# Patient Record
Sex: Female | Born: 1940 | Race: White | Hispanic: No | Marital: Married | Smoking: Never smoker
Health system: Southern US, Community
[De-identification: ages and names within clinical notes are randomized; demographics above are authoritative.]

## PROBLEM LIST (undated history)

## (undated) DIAGNOSIS — I10 Essential (primary) hypertension: Secondary | ICD-10-CM

## (undated) DIAGNOSIS — Z923 Personal history of irradiation: Secondary | ICD-10-CM

## (undated) DIAGNOSIS — N6019 Diffuse cystic mastopathy of unspecified breast: Secondary | ICD-10-CM

## (undated) DIAGNOSIS — R112 Nausea with vomiting, unspecified: Secondary | ICD-10-CM

## (undated) DIAGNOSIS — R011 Cardiac murmur, unspecified: Secondary | ICD-10-CM

## (undated) DIAGNOSIS — R109 Unspecified abdominal pain: Secondary | ICD-10-CM

## (undated) DIAGNOSIS — G4730 Sleep apnea, unspecified: Secondary | ICD-10-CM

## (undated) DIAGNOSIS — C801 Malignant (primary) neoplasm, unspecified: Secondary | ICD-10-CM

## (undated) DIAGNOSIS — Z950 Presence of cardiac pacemaker: Secondary | ICD-10-CM

## (undated) DIAGNOSIS — K760 Fatty (change of) liver, not elsewhere classified: Secondary | ICD-10-CM

## (undated) DIAGNOSIS — E119 Type 2 diabetes mellitus without complications: Secondary | ICD-10-CM

## (undated) DIAGNOSIS — K219 Gastro-esophageal reflux disease without esophagitis: Secondary | ICD-10-CM

## (undated) DIAGNOSIS — T7840XA Allergy, unspecified, initial encounter: Secondary | ICD-10-CM

## (undated) DIAGNOSIS — K52831 Collagenous colitis: Secondary | ICD-10-CM

## (undated) DIAGNOSIS — E785 Hyperlipidemia, unspecified: Secondary | ICD-10-CM

## (undated) DIAGNOSIS — I509 Heart failure, unspecified: Secondary | ICD-10-CM

## (undated) DIAGNOSIS — I499 Cardiac arrhythmia, unspecified: Secondary | ICD-10-CM

## (undated) DIAGNOSIS — R197 Diarrhea, unspecified: Secondary | ICD-10-CM

## (undated) DIAGNOSIS — K589 Irritable bowel syndrome without diarrhea: Secondary | ICD-10-CM

## (undated) DIAGNOSIS — K7460 Unspecified cirrhosis of liver: Secondary | ICD-10-CM

## (undated) DIAGNOSIS — D509 Iron deficiency anemia, unspecified: Secondary | ICD-10-CM

## (undated) DIAGNOSIS — K5790 Diverticulosis of intestine, part unspecified, without perforation or abscess without bleeding: Secondary | ICD-10-CM

## (undated) DIAGNOSIS — Z9889 Other specified postprocedural states: Secondary | ICD-10-CM

## (undated) DIAGNOSIS — H269 Unspecified cataract: Secondary | ICD-10-CM

## (undated) DIAGNOSIS — I2510 Atherosclerotic heart disease of native coronary artery without angina pectoris: Secondary | ICD-10-CM

## (undated) DIAGNOSIS — E039 Hypothyroidism, unspecified: Secondary | ICD-10-CM

## (undated) HISTORY — PX: APPENDECTOMY: SHX54

## (undated) HISTORY — DX: Heart failure, unspecified: I50.9

## (undated) HISTORY — PX: ABDOMINAL HYSTERECTOMY: SHX81

## (undated) HISTORY — PX: CHOLECYSTECTOMY: SHX55

## (undated) HISTORY — PX: OTHER SURGICAL HISTORY: SHX169

## (undated) HISTORY — DX: Diverticulosis of intestine, part unspecified, without perforation or abscess without bleeding: K57.90

## (undated) HISTORY — DX: Collagenous colitis: K52.831

## (undated) HISTORY — DX: Unspecified cataract: H26.9

## (undated) HISTORY — PX: EYE SURGERY: SHX253

## (undated) HISTORY — DX: Malignant (primary) neoplasm, unspecified: C80.1

## (undated) HISTORY — PX: CATARACT EXTRACTION: SUR2

## (undated) HISTORY — PX: ABDOMINAL SURGERY: SHX537

## (undated) HISTORY — PX: CARDIAC SURGERY: SHX584

## (undated) HISTORY — PX: TOTAL ABDOMINAL HYSTERECTOMY W/ BILATERAL SALPINGOOPHORECTOMY: SHX83

## (undated) HISTORY — DX: Irritable bowel syndrome, unspecified: K58.9

## (undated) HISTORY — PX: BREAST EXCISIONAL BIOPSY: SUR124

## (undated) HISTORY — DX: Type 2 diabetes mellitus without complications: E11.9

## (undated) HISTORY — DX: Cardiac murmur, unspecified: R01.1

## (undated) HISTORY — DX: Allergy, unspecified, initial encounter: T78.40XA

## (undated) HISTORY — DX: Fatty (change of) liver, not elsewhere classified: K76.0

## (undated) HISTORY — DX: Sleep apnea, unspecified: G47.30

## (undated) HISTORY — PX: JOINT REPLACEMENT: SHX530

---

## 1998-12-27 ENCOUNTER — Encounter: Payer: Self-pay | Admitting: Emergency Medicine

## 1998-12-27 ENCOUNTER — Emergency Department (HOSPITAL_COMMUNITY): Admission: EM | Admit: 1998-12-27 | Discharge: 1998-12-27 | Payer: Self-pay | Admitting: Emergency Medicine

## 2014-02-11 HISTORY — PX: BREAST BIOPSY: SHX20

## 2014-06-28 ENCOUNTER — Encounter: Payer: Self-pay | Admitting: Podiatry

## 2014-06-28 ENCOUNTER — Ambulatory Visit (INDEPENDENT_AMBULATORY_CARE_PROVIDER_SITE_OTHER): Payer: Medicare Other | Admitting: Podiatry

## 2014-06-28 VITALS — Ht 63.0 in | Wt 160.0 lb

## 2014-06-28 DIAGNOSIS — L600 Ingrowing nail: Secondary | ICD-10-CM

## 2014-06-28 NOTE — Progress Notes (Signed)
   Subjective:    Patient ID: Annette Foster, female    DOB: [birth year], 74 y.o.   MRN: [id number]  HPI 74 year old female presents the office they for follow-up evaluation of bilateral big toe cellulitis from what appears to be a previous ingrown toenail. She states that she recently just new here from [place], [place] and she was treated for which she says was cellulitis to both of her big toes. She does have a follow-up appointment with them however since removed she would like to establish care here. From what she describes it sounds like she had an ingrown toenail which was removed during a slant back procedure. She states that since the area was trimmed she's had significant reduction in her symptoms and she denies any redness at this time. She denies any pain to the toenails. No other areas of tenderness to bilateral lower extremities. She does that she was using the cream to put over the area however she is unsure the name. No other complaints at this time.  Review of Systems  HENT:       Sinus problems   Cardiovascular:       Heart surgery 12/72 [facility]   Gastrointestinal:       IBS Reflex   Endocrine:       Thyroid disease Diabetes   All other systems reviewed and are negative.      Objective:   Physical Exam AAO x3, NAD DP/PT pulses palpable bilaterally, CRT less than 3 seconds Protective sensation intact with Simms Weinstein monofilament, vibratory sensation intact, Achilles tendon reflex intact There is mild evidence of incurvation of both the medial and lateral nail borders of bilateral hallux with some hyperkeratotic tissue within the nail borders. Currently there is no tenderness to palpation overlying the areasno surrounding edema, erythema, drainage or increase in warmth. There is no discoloration or evidence of cellulitis to both of the big toes at this time. No other areas of edema, erythema, increase in warmth to bilateral lower extremities. MMT 5/5, ROM WNL.  No open  lesions or pre-ulcerative lesions.   No pain with calf compression, swelling, warmth, erythema bilaterally.      Assessment & Plan:  74 year old female follow up evaluation for appearance cellulitis which appears to be resolved bilateral hallux -Treatment options were discussed include alternatives, risks, complications -I debrided bilateral hallux nail borders without complications to bleeding. -Discussed the importance of the right foot inspection. -Follow-up in 3 months or sooner if any problems are to arise. Call with questions or concerns in the meantime.

## 2014-07-26 ENCOUNTER — Other Ambulatory Visit: Payer: Self-pay | Admitting: Internal Medicine

## 2014-07-26 DIAGNOSIS — Z1231 Encounter for screening mammogram for malignant neoplasm of breast: Secondary | ICD-10-CM

## 2014-08-11 ENCOUNTER — Ambulatory Visit
Admission: RE | Admit: 2014-08-11 | Discharge: 2014-08-11 | Disposition: A | Discharge status: Home / Self Care | Payer: Medicare Other | Source: Ambulatory Visit | Attending: Internal Medicine | Admitting: Internal Medicine

## 2014-08-11 ENCOUNTER — Other Ambulatory Visit: Payer: Self-pay | Admitting: Internal Medicine

## 2014-08-11 DIAGNOSIS — Z1231 Encounter for screening mammogram for malignant neoplasm of breast: Secondary | ICD-10-CM | POA: Diagnosis not present

## 2014-08-11 HISTORY — DX: Diffuse cystic mastopathy of unspecified breast: N60.19

## 2014-10-04 ENCOUNTER — Ambulatory Visit (INDEPENDENT_AMBULATORY_CARE_PROVIDER_SITE_OTHER): Payer: Medicare Other | Admitting: Podiatry

## 2014-10-04 DIAGNOSIS — M79676 Pain in unspecified toe(s): Secondary | ICD-10-CM

## 2014-10-04 DIAGNOSIS — B351 Tinea unguium: Secondary | ICD-10-CM | POA: Diagnosis not present

## 2014-10-05 NOTE — Progress Notes (Signed)
Patient ID: Annette Foster, female   DOB: 1940/10/01, 74 y.o.   MRN: [id number]  Subjective: 74 y.o. returns the office today for painful, elongated, thickened toenails which she is unable to trim herself. Denies any redness or drainage around the nails. Denies any acute changes since last appointment and no new complaints today. Denies any systemic complaints such as fevers, chills, nausea, vomiting.   Objective: AAO 3, NAD DP/PT pulses palpable, CRT less than 3 seconds Nails hypertrophic, dystrophic, elongated, brittle, discolored 10. There is tenderness overlying the nails 1-5 bilaterally. There is no surrounding erythema or drainage along the nail sites. No open lesions or pre-ulcerative lesions are identified. No other areas of tenderness bilateral lower extremities. No overlying edema, erythema, increased warmth. No pain with calf compression, swelling, warmth, erythema.  Assessment: Patient presents with symptomatic onychomycosis  Plan: -Treatment options including alternatives, risks, complications were discussed -Nails sharply debrided 10 without complication/bleeding. -Discussed daily foot inspection. If there are any changes, to call the office immediately.  -Follow-up in 3 months or sooner if any problems are to arise. In the meantime, encouraged to call the office with any questions, concerns, changes symptoms.  Celesta Gentile, DPM

## 2015-01-10 ENCOUNTER — Ambulatory Visit (INDEPENDENT_AMBULATORY_CARE_PROVIDER_SITE_OTHER): Payer: Medicare Other | Admitting: Podiatry

## 2015-01-10 DIAGNOSIS — B351 Tinea unguium: Secondary | ICD-10-CM | POA: Diagnosis not present

## 2015-01-10 DIAGNOSIS — M79676 Pain in unspecified toe(s): Secondary | ICD-10-CM

## 2015-01-10 NOTE — Progress Notes (Signed)
Patient ID: Annette Foster, female   DOB: 01-Aug-1940, 74 y.o.   MRN: [id number]  Subjective: 74 y.o. returns the office today for painful, elongated, thickened toenails which she is unable to trim herself. Denies any redness or drainage around the nails. Denies any acute changes since last appointment and no new complaints today. Denies any systemic complaints such as fevers, chills, nausea, vomiting.   Objective: AAO 3, NAD DP/PT pulses palpable, CRT less than 3 seconds Nails hypertrophic, dystrophic, elongated, brittle, discolored 10. There is tenderness overlying the nails 1-5 bilaterally. There is no surrounding erythema or drainage along the nail sites. No open lesions or pre-ulcerative lesions are identified. No other areas of tenderness bilateral lower extremities. No overlying edema, erythema, increased warmth. No pain with calf compression, swelling, warmth, erythema.  Assessment: Patient presents with symptomatic onychomycosis  Plan: -Treatment options including alternatives, risks, complications were discussed -Nails sharply debrided 10 without complication/bleeding. -Discussed daily foot inspection. If there are any changes, to call the office immediately.  -Follow-up in 3 months or sooner if any problems are to arise. In the meantime, encouraged to call the office with any questions, concerns, changes symptoms.  Celesta Gentile, DPM

## 2015-02-12 DIAGNOSIS — C801 Malignant (primary) neoplasm, unspecified: Secondary | ICD-10-CM

## 2015-02-12 DIAGNOSIS — Z923 Personal history of irradiation: Secondary | ICD-10-CM

## 2015-02-12 HISTORY — DX: Personal history of irradiation: Z92.3

## 2015-02-12 HISTORY — DX: Malignant (primary) neoplasm, unspecified: C80.1

## 2015-04-11 ENCOUNTER — Ambulatory Visit: Admitting: Podiatry

## 2015-04-20 ENCOUNTER — Ambulatory Visit (INDEPENDENT_AMBULATORY_CARE_PROVIDER_SITE_OTHER): Payer: Medicare Other | Admitting: Podiatry

## 2015-04-20 ENCOUNTER — Encounter: Payer: Self-pay | Admitting: Podiatry

## 2015-04-20 DIAGNOSIS — M79676 Pain in unspecified toe(s): Secondary | ICD-10-CM

## 2015-04-20 DIAGNOSIS — B351 Tinea unguium: Secondary | ICD-10-CM

## 2015-04-20 NOTE — Progress Notes (Signed)
Patient ID: Annette Foster, female   DOB: [birth year], 75 y.o.   MRN: [id number]  Subjective: 75 y.o. returns the office today for painful, elongated, thickened toenails which she is unable to trim herself. Denies any redness or drainage around the nails. Denies any acute changes since last appointment and no new complaints today. Denies any systemic complaints such as fevers, chills, nausea, vomiting.   Objective: AAO 3, NAD DP/PT pulses palpable, CRT less than 3 seconds Nails hypertrophic, dystrophic, elongated, brittle, discolored 10. There is tenderness overlying the nails 1-5 bilaterally. There is no surrounding erythema or drainage along the nail sites. No open lesions or pre-ulcerative lesions are identified. No other areas of tenderness bilateral lower extremities. No overlying edema, erythema, increased warmth. No pain with calf compression, swelling, warmth, erythema.  Assessment: Patient presents with symptomatic onychomycosis  Plan: -Treatment options including alternatives, risks, complications were discussed -Nails sharply debrided 10 without complication/bleeding. -Discussed daily foot inspection. If there are any changes, to call the office immediately.  -Follow-up in 3 months or sooner if any problems are to arise. In the meantime, encouraged to call the office with any questions, concerns, changes symptoms.  Celesta Gentile, DPM

## 2015-07-04 ENCOUNTER — Other Ambulatory Visit: Payer: Self-pay | Admitting: Internal Medicine

## 2015-07-04 DIAGNOSIS — Z1231 Encounter for screening mammogram for malignant neoplasm of breast: Secondary | ICD-10-CM

## 2015-07-20 DIAGNOSIS — M79673 Pain in unspecified foot: Secondary | ICD-10-CM

## 2015-07-25 ENCOUNTER — Ambulatory Visit: Payer: Medicare Other | Admitting: Podiatry

## 2015-08-03 ENCOUNTER — Ambulatory Visit: Payer: Medicare Other | Admitting: Podiatry

## 2015-08-16 ENCOUNTER — Other Ambulatory Visit: Payer: Self-pay | Admitting: Internal Medicine

## 2015-08-16 ENCOUNTER — Ambulatory Visit
Admission: RE | Admit: 2015-08-16 | Discharge: 2015-08-16 | Disposition: A | Discharge status: Home / Self Care | Payer: Medicare Other | Source: Ambulatory Visit | Attending: Internal Medicine | Admitting: Internal Medicine

## 2015-08-16 DIAGNOSIS — Z1231 Encounter for screening mammogram for malignant neoplasm of breast: Secondary | ICD-10-CM

## 2015-08-16 DIAGNOSIS — R921 Mammographic calcification found on diagnostic imaging of breast: Secondary | ICD-10-CM | POA: Diagnosis not present

## 2015-08-21 ENCOUNTER — Other Ambulatory Visit: Payer: Self-pay | Admitting: Internal Medicine

## 2015-08-21 DIAGNOSIS — R921 Mammographic calcification found on diagnostic imaging of breast: Secondary | ICD-10-CM

## 2015-09-01 ENCOUNTER — Ambulatory Visit
Admission: RE | Admit: 2015-09-01 | Discharge: 2015-09-01 | Disposition: A | Discharge status: Home / Self Care | Payer: Medicare Other | Source: Ambulatory Visit | Attending: Internal Medicine | Admitting: Internal Medicine

## 2015-09-01 ENCOUNTER — Other Ambulatory Visit: Payer: Self-pay | Admitting: Internal Medicine

## 2015-09-01 DIAGNOSIS — R921 Mammographic calcification found on diagnostic imaging of breast: Secondary | ICD-10-CM

## 2015-09-04 ENCOUNTER — Other Ambulatory Visit: Payer: Self-pay | Admitting: Internal Medicine

## 2015-09-04 DIAGNOSIS — R921 Mammographic calcification found on diagnostic imaging of breast: Secondary | ICD-10-CM

## 2015-09-11 ENCOUNTER — Ambulatory Visit
Admission: RE | Admit: 2015-09-11 | Discharge: 2015-09-11 | Disposition: A | Discharge status: Home / Self Care | Payer: Medicare Other | Source: Ambulatory Visit | Attending: Internal Medicine | Admitting: Internal Medicine

## 2015-09-11 DIAGNOSIS — R921 Mammographic calcification found on diagnostic imaging of breast: Secondary | ICD-10-CM

## 2015-09-11 DIAGNOSIS — D0591 Unspecified type of carcinoma in situ of right breast: Secondary | ICD-10-CM | POA: Insufficient documentation

## 2015-09-11 DIAGNOSIS — R920 Mammographic microcalcification found on diagnostic imaging of breast: Secondary | ICD-10-CM | POA: Insufficient documentation

## 2015-09-11 HISTORY — PX: BREAST BIOPSY: SHX20

## 2015-09-12 ENCOUNTER — Encounter: Payer: Self-pay | Admitting: Podiatry

## 2015-09-12 ENCOUNTER — Ambulatory Visit (INDEPENDENT_AMBULATORY_CARE_PROVIDER_SITE_OTHER): Payer: Medicare Other | Admitting: Podiatry

## 2015-09-12 DIAGNOSIS — B351 Tinea unguium: Secondary | ICD-10-CM | POA: Diagnosis not present

## 2015-09-12 DIAGNOSIS — M79676 Pain in unspecified toe(s): Secondary | ICD-10-CM | POA: Diagnosis not present

## 2015-09-13 ENCOUNTER — Encounter: Payer: Self-pay | Admitting: *Deleted

## 2015-09-17 NOTE — Progress Notes (Signed)
Patient ID: Annette Foster, female   DOB: 10/18/1940, 74 y.o.   MRN: [id number]  Subjective: 74 y.o. returns the office today for painful, elongated, thickened toenails which she is unable to trim herself. Denies any redness or drainage around the nails. Denies any acute changes since last appointment and no new complaints today. Denies any systemic complaints such as fevers, chills, nausea, vomiting.   Objective: AAO 3, NAD DP/PT pulses palpable, CRT less than 3 seconds Nails hypertrophic, dystrophic, elongated, brittle, discolored 10. There is tenderness overlying the nails 1-5 bilaterally. There is no surrounding erythema or drainage along the nail sites. No open lesions or pre-ulcerative lesions are identified. No other areas of tenderness bilateral lower extremities. No overlying edema, erythema, increased warmth. No pain with calf compression, swelling, warmth, erythema.  Assessment: Patient presents with symptomatic onychomycosis  Plan: -Treatment options including alternatives, risks, complications were discussed -Nails sharply debrided 10 without complication/bleeding. -Discussed daily foot inspection. If there are any changes, to call the office immediately.  -Follow-up in 3 months or sooner if any problems are to arise. In the meantime, encouraged to call the office with any questions, concerns, changes symptoms.  Jamall Strohmeier, DPM  

## 2015-09-18 ENCOUNTER — Ambulatory Visit: Admitting: General Surgery

## 2015-09-18 ENCOUNTER — Encounter: Payer: Self-pay | Admitting: *Deleted

## 2015-09-21 NOTE — Progress Notes (Signed)
  Oncology Nurse Navigator Documentation  Navigator Location: [facility] (09/21/15 1400) Navigator Encounter Type: Clinic/MDC (09/21/15 1400)   Abnormal Finding Date: 09/01/15 (09/21/15 1400) Confirmed Diagnosis Date: 09/12/15 (09/21/15 1400)     Patient Visit Type: Walk-in (09/21/15 1400) Treatment Phase: Pre-Tx/Tx Discussion (09/21/15 1400) Barriers/Navigation Needs: Coordination of Care;Education (09/21/15 1400) Education: Coping with Diagnosis/ Prognosis;Newly Diagnosed Cancer Education;Other (09/21/15 1400) Interventions: Coordination of Care;Education Method (09/21/15 1400)   Coordination of Care: Appts (09/21/15 1400) Education Method: Written (09/21/15 1400)  Support Groups/Services: Breast Support Group;After Breast Cancer Class (09/21/15 1400)   Acuity: Level 2 (09/21/15 1400)         Time Spent with Patient: 60 (09/21/15 1400)   Patient came to the cancer center after being informed of her diagnosis to see one of the navigators.  Met with patient and reviewed diagnosis and scheduled patient appointments with Dr. Jamal Collin and Dr. Rogue Bussing per her request.  States she is currently taking estrogen on an [employer] study.  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  She is agreeable.  She is to call if she has any questions or needs.

## 2015-09-21 NOTE — Progress Notes (Signed)
Patient called and wanted to change her surgical consult to Dr. Tamala Julian.  Appointment scheduled to see Dr. Tamala Julian on Thursday 09/21/15.  She is to cancel the appointment with Dr. Jamal Collin.

## 2015-09-22 ENCOUNTER — Encounter: Payer: Self-pay | Admitting: *Deleted

## 2015-09-22 NOTE — Progress Notes (Signed)
  Oncology Nurse Navigator Documentation  Navigator Location: [facility] (09/22/15 1300) Navigator Encounter Type: Telephone (09/22/15 1300) Telephone: Incoming Call (09/22/15 1300)     Surgery Date: 09/29/15 (09/22/15 1300) Treatment Initiated Date: 09/29/15 (09/22/15 1300)     Barriers/Navigation Needs: Coordination of Care (09/22/15 1300)       Coordination of Care: Appts (09/22/15 1300)        Acuity: Level 2 (09/22/15 1300)         Time Spent with Patient: 30 (09/22/15 1300)   Patient called with her surgery date of 09/29/15.  States Dr. Tamala Julian recommended she change her medical oncology visit to a later date to ensure path availability.  Appointment changed to 10/12/15 @ 8:30.

## 2015-09-25 ENCOUNTER — Other Ambulatory Visit: Payer: Self-pay | Admitting: Surgery

## 2015-09-25 DIAGNOSIS — D0512 Intraductal carcinoma in situ of left breast: Secondary | ICD-10-CM

## 2015-09-25 LAB — SURGICAL PATHOLOGY

## 2015-09-26 ENCOUNTER — Other Ambulatory Visit: Payer: Self-pay | Admitting: Surgery

## 2015-09-26 ENCOUNTER — Encounter
Admission: RE | Admit: 2015-09-26 | Discharge: 2015-09-26 | Disposition: A | Discharge status: Home / Self Care | Payer: Medicare Other | Source: Ambulatory Visit | Attending: Surgery | Admitting: Surgery

## 2015-09-26 DIAGNOSIS — E119 Type 2 diabetes mellitus without complications: Secondary | ICD-10-CM | POA: Diagnosis not present

## 2015-09-26 DIAGNOSIS — Z885 Allergy status to narcotic agent status: Secondary | ICD-10-CM | POA: Diagnosis not present

## 2015-09-26 DIAGNOSIS — G4730 Sleep apnea, unspecified: Secondary | ICD-10-CM | POA: Diagnosis not present

## 2015-09-26 DIAGNOSIS — Z9049 Acquired absence of other specified parts of digestive tract: Secondary | ICD-10-CM | POA: Diagnosis not present

## 2015-09-26 DIAGNOSIS — Z90722 Acquired absence of ovaries, bilateral: Secondary | ICD-10-CM | POA: Diagnosis not present

## 2015-09-26 DIAGNOSIS — K589 Irritable bowel syndrome without diarrhea: Secondary | ICD-10-CM | POA: Diagnosis not present

## 2015-09-26 DIAGNOSIS — Z881 Allergy status to other antibiotic agents status: Secondary | ICD-10-CM | POA: Diagnosis not present

## 2015-09-26 DIAGNOSIS — Z9889 Other specified postprocedural states: Secondary | ICD-10-CM | POA: Diagnosis not present

## 2015-09-26 DIAGNOSIS — D0512 Intraductal carcinoma in situ of left breast: Secondary | ICD-10-CM | POA: Diagnosis present

## 2015-09-26 DIAGNOSIS — Z96652 Presence of left artificial knee joint: Secondary | ICD-10-CM | POA: Diagnosis not present

## 2015-09-26 DIAGNOSIS — Z90710 Acquired absence of both cervix and uterus: Secondary | ICD-10-CM | POA: Diagnosis not present

## 2015-09-26 DIAGNOSIS — Z7982 Long term (current) use of aspirin: Secondary | ICD-10-CM | POA: Diagnosis not present

## 2015-09-26 DIAGNOSIS — K219 Gastro-esophageal reflux disease without esophagitis: Secondary | ICD-10-CM | POA: Diagnosis not present

## 2015-09-26 DIAGNOSIS — Z79899 Other long term (current) drug therapy: Secondary | ICD-10-CM | POA: Diagnosis not present

## 2015-09-26 DIAGNOSIS — E7800 Pure hypercholesterolemia, unspecified: Secondary | ICD-10-CM | POA: Diagnosis not present

## 2015-09-26 DIAGNOSIS — E039 Hypothyroidism, unspecified: Secondary | ICD-10-CM | POA: Diagnosis not present

## 2015-09-26 DIAGNOSIS — I509 Heart failure, unspecified: Secondary | ICD-10-CM | POA: Diagnosis not present

## 2015-09-26 DIAGNOSIS — Z803 Family history of malignant neoplasm of breast: Secondary | ICD-10-CM | POA: Diagnosis not present

## 2015-09-26 DIAGNOSIS — Z888 Allergy status to other drugs, medicaments and biological substances status: Secondary | ICD-10-CM | POA: Diagnosis not present

## 2015-09-26 HISTORY — DX: Nausea with vomiting, unspecified: R11.2

## 2015-09-26 HISTORY — DX: Other specified postprocedural states: Z98.890

## 2015-09-26 HISTORY — DX: Hypothyroidism, unspecified: E03.9

## 2015-09-26 HISTORY — DX: Gastro-esophageal reflux disease without esophagitis: K21.9

## 2015-09-26 NOTE — Patient Instructions (Signed)
Your procedure is scheduled on: Friday 09/29/15 Report to [facility]Mar[facility] Kitchen  Remember: Instructions that are not followed completely may result in serious medical risk, up to and including death, or upon the discretion of your surgeon and anesthesiologist your surgery may need to be rescheduled.    __X__ 1. Do not eat food or drink liquids after midnight. No gum chewing or hard candies.     __X__ 2. No Alcohol for 24 hours before or after surgery.   ____ 3. Bring all medications with you on the day of surgery if instructed.    __X__ 4. Notify your doctor if there is any change in your medical condition     (cold, fever, infections).     Do not wear jewelry, make-up, hairpins, clips or nail polish.  Do not wear lotions, powders, or perfumes.   Do not shave 48 hours prior to surgery. Men may shave face and neck.  Do not bring valuables to the hospital.    [facility] is not responsible for any belongings or valuables.               Contacts, dentures or bridgework may not be worn into surgery.  Leave your suitcase in the car. After surgery it may be brought to your room.  For patients admitted to the hospital, discharge time is determined by your                treatment team.   Patients discharged the day of surgery will not be allowed to drive home.   Please read over the following fact sheets that you were given:   Surgical Site Infection Prevention   __X__ Take these medicines the morning of surgery with A SIP OF WATER:    1. FEXOFENADINE  2. LANSOPRAZOLE  3. HYOSCYAMINE  4. LEVOTHYROXINE  5. VALSARTAN  6.  ____ Fleet Enema (as directed)   __X__ Use CHG Soap as directed  ____ Use inhalers on the day of surgery  __X__ Stop metformin 2 days prior to surgery    ____ Take 1/2 of usual insulin dose the night before surgery and none on the morning of surgery.   __X__ Stop Coumadin/Plavix/aspirin on ALREADY STOPPED  ____ Stop  Anti-inflammatories on    ____ Stop supplements until after surgery.    __X__ Bring C-Pap to the hospital.

## 2015-09-29 ENCOUNTER — Ambulatory Visit
Admission: RE | Admit: 2015-09-29 | Discharge: 2015-09-29 | Disposition: A | Discharge status: Home / Self Care | Payer: Medicare Other | Source: Ambulatory Visit | Attending: Surgery | Admitting: Surgery

## 2015-09-29 ENCOUNTER — Ambulatory Visit: Payer: Medicare Other | Admitting: Certified Registered Nurse Anesthetist

## 2015-09-29 ENCOUNTER — Encounter
Admission: RE | Disposition: A | Discharge status: Home / Self Care | Payer: Self-pay | Source: Ambulatory Visit | Attending: Surgery

## 2015-09-29 DIAGNOSIS — E7800 Pure hypercholesterolemia, unspecified: Secondary | ICD-10-CM | POA: Insufficient documentation

## 2015-09-29 DIAGNOSIS — Z96652 Presence of left artificial knee joint: Secondary | ICD-10-CM | POA: Insufficient documentation

## 2015-09-29 DIAGNOSIS — E119 Type 2 diabetes mellitus without complications: Secondary | ICD-10-CM | POA: Insufficient documentation

## 2015-09-29 DIAGNOSIS — G4730 Sleep apnea, unspecified: Secondary | ICD-10-CM | POA: Insufficient documentation

## 2015-09-29 DIAGNOSIS — I509 Heart failure, unspecified: Secondary | ICD-10-CM | POA: Insufficient documentation

## 2015-09-29 DIAGNOSIS — Z888 Allergy status to other drugs, medicaments and biological substances status: Secondary | ICD-10-CM | POA: Insufficient documentation

## 2015-09-29 DIAGNOSIS — Z7982 Long term (current) use of aspirin: Secondary | ICD-10-CM | POA: Insufficient documentation

## 2015-09-29 DIAGNOSIS — K219 Gastro-esophageal reflux disease without esophagitis: Secondary | ICD-10-CM | POA: Insufficient documentation

## 2015-09-29 DIAGNOSIS — Z885 Allergy status to narcotic agent status: Secondary | ICD-10-CM | POA: Insufficient documentation

## 2015-09-29 DIAGNOSIS — D0512 Intraductal carcinoma in situ of left breast: Secondary | ICD-10-CM | POA: Insufficient documentation

## 2015-09-29 DIAGNOSIS — Z881 Allergy status to other antibiotic agents status: Secondary | ICD-10-CM | POA: Insufficient documentation

## 2015-09-29 DIAGNOSIS — Z79899 Other long term (current) drug therapy: Secondary | ICD-10-CM | POA: Insufficient documentation

## 2015-09-29 DIAGNOSIS — Z90722 Acquired absence of ovaries, bilateral: Secondary | ICD-10-CM | POA: Insufficient documentation

## 2015-09-29 DIAGNOSIS — Z9889 Other specified postprocedural states: Secondary | ICD-10-CM | POA: Insufficient documentation

## 2015-09-29 DIAGNOSIS — E039 Hypothyroidism, unspecified: Secondary | ICD-10-CM | POA: Insufficient documentation

## 2015-09-29 DIAGNOSIS — Z90710 Acquired absence of both cervix and uterus: Secondary | ICD-10-CM | POA: Insufficient documentation

## 2015-09-29 DIAGNOSIS — Z803 Family history of malignant neoplasm of breast: Secondary | ICD-10-CM | POA: Insufficient documentation

## 2015-09-29 DIAGNOSIS — Z9049 Acquired absence of other specified parts of digestive tract: Secondary | ICD-10-CM | POA: Insufficient documentation

## 2015-09-29 DIAGNOSIS — K589 Irritable bowel syndrome without diarrhea: Secondary | ICD-10-CM | POA: Insufficient documentation

## 2015-09-29 HISTORY — PX: PARTIAL MASTECTOMY WITH NEEDLE LOCALIZATION: SHX6008

## 2015-09-29 LAB — GLUCOSE, CAPILLARY: Glucose-Capillary: 124 mg/dL — ABNORMAL HIGH (ref 65–99)

## 2015-09-29 SURGERY — PARTIAL MASTECTOMY WITH NEEDLE LOCALIZATION
Anesthesia: General | Laterality: Left

## 2015-09-29 MED ORDER — HYDROMORPHONE HCL 1 MG/ML IJ SOLN: 0.2500 mg | INTRAMUSCULAR | Status: DC | PRN

## 2015-09-29 MED ORDER — BUPIVACAINE-EPINEPHRINE 0.5% -1:200000 IJ SOLN
INTRAMUSCULAR | Status: DC | PRN
  Administered 2015-09-29: 10 mL

## 2015-09-29 MED ORDER — LIDOCAINE HCL (CARDIAC) 20 MG/ML IV SOLN
INTRAVENOUS | Status: DC | PRN
  Administered 2015-09-29: 100 mg via INTRAVENOUS

## 2015-09-29 MED ORDER — DEXAMETHASONE SODIUM PHOSPHATE 4 MG/ML IJ SOLN
INTRAMUSCULAR | Status: DC | PRN
  Administered 2015-09-29: 5 mg via INTRAVENOUS

## 2015-09-29 MED ORDER — BUPIVACAINE-EPINEPHRINE (PF) 0.5% -1:200000 IJ SOLN
INTRAMUSCULAR | Status: AC
  Filled 2015-09-29: qty 30

## 2015-09-29 MED ORDER — PROPOFOL 10 MG/ML IV BOLUS
INTRAVENOUS | Status: DC | PRN
  Administered 2015-09-29: 130 mg via INTRAVENOUS

## 2015-09-29 MED ORDER — ONDANSETRON HCL 4 MG/2ML IJ SOLN: 4.0000 mg | Freq: Once | INTRAMUSCULAR | Status: DC | PRN

## 2015-09-29 MED ORDER — ONDANSETRON HCL 4 MG/2ML IJ SOLN
INTRAMUSCULAR | Status: DC | PRN
  Administered 2015-09-29: 4 mg via INTRAVENOUS

## 2015-09-29 MED ORDER — EPHEDRINE SULFATE 50 MG/ML IJ SOLN
INTRAMUSCULAR | Status: DC | PRN
  Administered 2015-09-29: 15 mg via INTRAVENOUS
  Administered 2015-09-29: 5 mg via INTRAVENOUS
  Administered 2015-09-29: 10 mg via INTRAVENOUS

## 2015-09-29 MED ORDER — SODIUM CHLORIDE 0.9 % IV SOLN
INTRAVENOUS | Status: DC
  Administered 2015-09-29: 13:00:00 via INTRAVENOUS

## 2015-09-29 MED ORDER — FENTANYL CITRATE (PF) 100 MCG/2ML IJ SOLN
INTRAMUSCULAR | Status: DC | PRN
  Administered 2015-09-29 (×2): 50 ug via INTRAVENOUS

## 2015-09-29 MED ORDER — SCOPOLAMINE 1 MG/3DAYS TD PT72: 1.0000 | MEDICATED_PATCH | TRANSDERMAL | Status: DC

## 2015-09-29 MED ORDER — KETAMINE HCL 50 MG/ML IJ SOLN
INTRAMUSCULAR | Status: DC | PRN
  Administered 2015-09-29: 40 mg via INTRAVENOUS

## 2015-09-29 MED ORDER — GLYCOPYRROLATE 0.2 MG/ML IJ SOLN
INTRAMUSCULAR | Status: DC | PRN
  Administered 2015-09-29: 0.2 mg via INTRAVENOUS

## 2015-09-29 MED ORDER — MIDAZOLAM HCL 2 MG/2ML IJ SOLN
INTRAMUSCULAR | Status: DC | PRN
Start: 2015-09-29 — End: 2015-09-29
  Administered 2015-09-29: 1 mg via INTRAVENOUS

## 2015-09-29 SURGICAL SUPPLY — 27 items
BLADE SURG 15 STRL LF DISP TIS (BLADE) ×1 IMPLANT
BLADE SURG 15 STRL SS (BLADE) ×2
CANISTER SUCT 1200ML W/VALVE (MISCELLANEOUS) ×2 IMPLANT
CHLORAPREP W/TINT 26ML (MISCELLANEOUS) ×2 IMPLANT
DEVICE DUBIN SPECIMEN MAMMOGRA (MISCELLANEOUS) ×2 IMPLANT
DRAPE LAPAROTOMY 77X122 PED (DRAPES) ×2 IMPLANT
ELECT REM PT RETURN 9FT ADLT (ELECTROSURGICAL) ×2
ELECTRODE REM PT RTRN 9FT ADLT (ELECTROSURGICAL) ×1 IMPLANT
GLOVE BIO SURGEON STRL SZ7.5 (GLOVE) ×3 IMPLANT
GLOVE INDICATOR 7.5 STRL GRN (GLOVE) ×1 IMPLANT
GOWN STRL REUS W/ TWL LRG LVL3 (GOWN DISPOSABLE) ×2 IMPLANT
GOWN STRL REUS W/TWL LRG LVL3 (GOWN DISPOSABLE) ×4
KIT RM TURNOVER STRD PROC AR (KITS) ×2 IMPLANT
LABEL OR SOLS (LABEL) ×1 IMPLANT
LIQUID BAND (GAUZE/BANDAGES/DRESSINGS) ×2 IMPLANT
MARGIN MAP 10MM (MISCELLANEOUS) ×2 IMPLANT
NDL HYPO 25X1 1.5 SAFETY (NEEDLE) ×1 IMPLANT
NEEDLE HYPO 25X1 1.5 SAFETY (NEEDLE) ×2 IMPLANT
PACK BASIN MINOR ARMC (MISCELLANEOUS) ×2 IMPLANT
SUT CHROMIC 4 0 RB 1X27 (SUTURE) ×2 IMPLANT
SUT ETHILON 3-0 FS-10 30 BLK (SUTURE) ×2
SUT MNCRL 4-0 (SUTURE) ×2
SUT MNCRL 4-0 27XMFL (SUTURE) ×1
SUTURE EHLN 3-0 FS-10 30 BLK (SUTURE) ×1 IMPLANT
SUTURE MNCRL 4-0 27XMF (SUTURE) ×1 IMPLANT
SYRINGE 10CC LL (SYRINGE) ×2 IMPLANT
WATER STERILE IRR 1000ML POUR (IV SOLUTION) ×2 IMPLANT

## 2015-09-29 NOTE — Anesthesia Procedure Notes (Signed)
Procedure Name: LMA Insertion Date/Time: 09/29/2015 2:00 PM Performed by: Rosaria Ferries, Hannia Matchett Pre-anesthesia Checklist: Patient identified, Emergency Drugs available, Suction available and Patient being monitored Patient Re-evaluated:Patient Re-evaluated prior to inductionOxygen Delivery Method: Circle system utilized Preoxygenation: Pre-oxygenation with 100% oxygen Intubation Type: IV induction LMA: LMA inserted LMA Size: 4.0 Number of attempts: 1 Placement Confirmation: breath sounds checked- equal and bilateral Tube secured with: Tape Dental Injury: Teeth and Oropharynx as per pre-operative assessment

## 2015-09-29 NOTE — Discharge Instructions (Signed)
AMBULATORY SURGERY  DISCHARGE INSTRUCTIONS   1) The drugs that you were given will stay in your system until tomorrow so for the next 24 hours you should not:  A) Drive an automobile B) Make any legal decisions C) Drink any alcoholic beverage   2) You may resume regular meals tomorrow.  Today it is better to start with liquids and gradually work up to solid foods.  You may eat anything you prefer, but it is better to start with liquids, then soup and crackers, and gradually work up to solid foods.   3) Please notify your doctor immediately if you have any unusual bleeding, trouble breathing, redness and pain at the surgery site, drainage, fever, or pain not relieved by medication.    4) Additional Instructions:        Please contact your physician with any problems or Same Day Surgery at [phone], Monday through Friday 6 am to 4 pm, or [facility] at [facility] number at [phone].Take Tylenol and/or tramadol as needed for pain.  Wear bra as desired for comfort and support.  May shower and blot dry.  Gradually increase activities as tolerated.

## 2015-09-29 NOTE — Op Note (Signed)
OPERATIVE REPORT  PREOPERATIVE  DIAGNOSIS: . Ductal carcinoma in situ left breast  POSTOPERATIVE DIAGNOSIS: . Ductal carcinoma in situ left breast  PROCEDURE: . Left partial mastectomy  ANESTHESIA:  General  SURGEON: Rochel Brome  MD   INDICATIONS: . She had recent mammogram findings of an area of distortion and microcalcifications of the upper slightly inner aspect of the left breast. Stereotactic needle biopsy demonstrated ductal carcinoma in situ. Surgery was recommended for definitive treatment. The patient did have insertion of 2 Kopan's wires to bracket the site of microcalcifications. Mammogram images were reviewed prior to surgery.  With the patient on the operating table in the supine position she was placed under general anesthesia. The dressing was removed from the upper aspect of the left breast. The Kopan's wires were cut 2 cm from the skin. The breast was prepared with ChloraPrep and draped in a sterile manner.  A transversely oriented curvilinear incision was made from 10:00 to 1:00 position 4.5 centimeters from the nipple. A narrow ellipse of skin 8 mm in width was excised with the underlying specimen. The dissection was carried down through subcutaneous tissues. Electrocautery was used for hemostasis. Dissection was carried out to encounter the anterior wire and continued to encounter the posterior wire. A mass of tissue surrounding the wires was removed. Markers were attached to the specimen to mark the medial lateral cranial caudal and deep margins. The specimen was submitted with Kopan's wires for specimen mammogram and pathology. The specimen mammogram did demonstrate the biopsy marker. The pathologist reported margins appeared to be satisfactory. The wound was inspected. Several small bleeding points are cauterized and hemostasis was subsequently intact. The wound was infiltrated with half percent Sensorcaine with epinephrine. The subcutaneous tissues were approximated with  interrupted 4-0 chromic. The skin was closed with running 4-0 Monocryl subcuticular suture and LiquiBand.  The patient tolerated the procedure satisfactorily and was prepared for transfer to the recovery room  [facility]D.

## 2015-09-29 NOTE — Transfer of Care (Signed)
Immediate Anesthesia Transfer of Care Note  Patient: Annette Foster  Procedure(s) Performed: Procedure(s): PARTIAL MASTECTOMY WITH NEEDLE LOCALIZATION (Left)  Patient Location: PACU  Anesthesia Type:General  Level of Consciousness: sedated  Airway & Oxygen Therapy: Patient Spontanous Breathing and Patient connected to face mask oxygen  Post-op Assessment: Report given to RN and Post -op Vital signs reviewed and stable  Post vital signs: Reviewed and stable  Last Vitals:  Vitals:   09/29/15 1228  BP: (!) 131/59  Pulse: (!) 49  Resp: 16  Temp: 36.5 C    Last Pain:  Vitals:   09/29/15 1228  TempSrc: Tympanic         Complications: No apparent anesthesia complications

## 2015-09-29 NOTE — Anesthesia Preprocedure Evaluation (Signed)
Anesthesia Evaluation  Patient identified by MRN, date of birth, ID band Patient awake    Reviewed: Allergy & Precautions, H&P , NPO status , Patient's Chart, lab work & pertinent test results, reviewed documented beta blocker date and time   History of Anesthesia Complications (+) PONV and history of anesthetic complications  Airway Mallampati: III  TM Distance: >3 FB Neck ROM: full    Dental  (+) Upper Dentures, Lower Dentures   Pulmonary neg pulmonary ROS, sleep apnea ,    Pulmonary exam normal        Cardiovascular Exercise Tolerance: Poor +CHF  negative cardio ROS Normal cardiovascular exam Rhythm:regular Rate:Normal     Neuro/Psych negative neurological ROS  negative psych ROS   GI/Hepatic negative GI ROS, Neg liver ROS, GERD  Medicated,  Endo/Other  negative endocrine ROSdiabetesHypothyroidism   Renal/GU negative Renal ROS  negative genitourinary   Musculoskeletal   Abdominal   Peds  Hematology negative hematology ROS (+)   Anesthesia Other Findings   Reproductive/Obstetrics negative OB ROS                             Anesthesia Physical Anesthesia Plan  ASA: II  Anesthesia Plan: General LMA   Post-op Pain Management:    Induction:   Airway Management Planned: LMA  Additional Equipment:   Intra-op Plan:   Post-operative Plan:   Informed Consent: I have reviewed the patients History and Physical, chart, labs and discussed the procedure including the risks, benefits and alternatives for the proposed anesthesia with the patient or authorized representative who has indicated his/her understanding and acceptance.     Plan Discussed with: CRNA  Anesthesia Plan Comments: (Pt reports a previous adverse reaction to fentanyl.  With a previous cholecystectomy she had a latent respiratory failure requiring Assistance and reports being told she had a reaction to the  medication.  Based on her report of the event, she appears to be Very sensitive to opioids in general.  She accepts administration of fentanyl today and we have discussed the situation at  Length and the risks and benefits.  We will plan on judicious use of opioids as discussed.  JA)        Anesthesia Quick Evaluation

## 2015-09-29 NOTE — OR Nursing (Signed)
Dr Andree Elk in to see patient aware of meds and no potassium ordered.

## 2015-09-29 NOTE — H&P (Signed)
  She comes in today for left partial mastectomy. She had recent biopsy findings of ductal carcinoma in situ. I have reviewed her mammogram images with the radiologist. Also reviewed the images done after insertion of 2 Kopan's wires.  She reports no change in overall condition since the day of the office exam.  I discussed the plan for surgery and postoperative care

## 2015-10-02 ENCOUNTER — Ambulatory Visit: Admitting: Internal Medicine

## 2015-10-02 ENCOUNTER — Encounter: Payer: Self-pay | Admitting: Surgery

## 2015-10-03 NOTE — Anesthesia Postprocedure Evaluation (Signed)
Anesthesia Post Note  Patient: Annette Foster  Procedure(s) Performed: Procedure(s) (LRB): PARTIAL MASTECTOMY WITH NEEDLE LOCALIZATION (Left)  Patient location during evaluation: PACU Anesthesia Type: General Level of consciousness: awake and alert and oriented Pain management: pain level controlled Vital Signs Assessment: post-procedure vital signs reviewed and stable Respiratory status: spontaneous breathing Cardiovascular status: blood pressure returned to baseline Anesthetic complications: no    Last Vitals:  Vitals:   09/29/15 1624 09/29/15 1628  BP: 127/66 (!) 127/56  Pulse: (!) 54 61  Resp: 18 18  Temp:      Last Pain:  Vitals:   09/29/15 1605  TempSrc:   PainSc: 0-No pain                 Kloie Whiting

## 2015-10-04 LAB — SURGICAL PATHOLOGY

## 2015-10-12 ENCOUNTER — Inpatient Hospital Stay: Payer: Medicare Other | Attending: Internal Medicine | Admitting: Internal Medicine

## 2015-10-12 ENCOUNTER — Encounter: Payer: Self-pay | Admitting: *Deleted

## 2015-10-12 ENCOUNTER — Encounter (INDEPENDENT_AMBULATORY_CARE_PROVIDER_SITE_OTHER): Payer: Self-pay

## 2015-10-12 ENCOUNTER — Encounter
Admission: RE | Admit: 2015-10-12 | Discharge: 2015-10-12 | Disposition: A | Discharge status: Home / Self Care | Payer: Medicare Other | Source: Ambulatory Visit | Attending: Surgery | Admitting: Surgery

## 2015-10-12 ENCOUNTER — Encounter: Payer: Self-pay | Admitting: Internal Medicine

## 2015-10-12 DIAGNOSIS — K219 Gastro-esophageal reflux disease without esophagitis: Secondary | ICD-10-CM | POA: Diagnosis not present

## 2015-10-12 DIAGNOSIS — K589 Irritable bowel syndrome without diarrhea: Secondary | ICD-10-CM | POA: Diagnosis not present

## 2015-10-12 DIAGNOSIS — Z79899 Other long term (current) drug therapy: Secondary | ICD-10-CM | POA: Insufficient documentation

## 2015-10-12 DIAGNOSIS — D0512 Intraductal carcinoma in situ of left breast: Secondary | ICD-10-CM | POA: Diagnosis not present

## 2015-10-12 DIAGNOSIS — E119 Type 2 diabetes mellitus without complications: Secondary | ICD-10-CM | POA: Insufficient documentation

## 2015-10-12 DIAGNOSIS — Z170 Estrogen receptor positive status [ER+]: Secondary | ICD-10-CM | POA: Insufficient documentation

## 2015-10-12 DIAGNOSIS — Z7982 Long term (current) use of aspirin: Secondary | ICD-10-CM | POA: Diagnosis not present

## 2015-10-12 DIAGNOSIS — E039 Hypothyroidism, unspecified: Secondary | ICD-10-CM | POA: Diagnosis not present

## 2015-10-12 DIAGNOSIS — Z7984 Long term (current) use of oral hypoglycemic drugs: Secondary | ICD-10-CM | POA: Diagnosis not present

## 2015-10-12 DIAGNOSIS — G4730 Sleep apnea, unspecified: Secondary | ICD-10-CM | POA: Diagnosis not present

## 2015-10-12 DIAGNOSIS — I509 Heart failure, unspecified: Secondary | ICD-10-CM | POA: Diagnosis not present

## 2015-10-12 DIAGNOSIS — J45909 Unspecified asthma, uncomplicated: Secondary | ICD-10-CM | POA: Diagnosis not present

## 2015-10-12 NOTE — Progress Notes (Signed)
[place] NOTE  Patient Care Team: Albina Billet, MD as PCP - General (Internal Medicine)  CHIEF COMPLAINTS/PURPOSE OF CONSULTATION:   # AUG 2017- DCIS LEFT BREAST ER/PR- POS; positive margins [Dr.Smith]; awaiting re-excision  # HRT [clinical trial thru [facility]; stopped July 2017 ]; CPAP    No history exists.     HISTORY OF PRESENTING ILLNESS:  Jordanne Elsbury 75 y.o.  female pleasant patient with recently diagnosed left breast DCIS has been deferred was for further evaluation and recommendations. Patient noted to have an abnormal screening mammogram that showed- calcifications; went on to have lumpectomy. However margin was positive for DCIS awaiting reexcision next week.  Patient denies any other lumps or bumps. Appetite is good. No weight loss. No headaches.  ROS: A complete 10 point review of system is done which is negative except mentioned above in history of present illness  MEDICAL HISTORY:  Past Medical History:  Diagnosis Date  . Allergy   . Asthma   . Cataract   . CHF (congestive heart failure) ([place])   . Collagenous colitis   . Diabetes mellitus without complication ([place])   . Fibrocystic breast   . GERD (gastroesophageal reflux disease)   . Heart murmur   . Hypothyroidism   . IBS (irritable bowel syndrome)   . PONV (postoperative nausea and vomiting)   . Sleep apnea     SURGICAL HISTORY: Past Surgical History:  Procedure Laterality Date  . ABDOMINAL SURGERY    . BREAST BIOPSY Bilateral    negative  . BREAST 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/2015   stereo pending  . CARDIAC SURGERY    . CATARACT EXTRACTION Right   . CHOLECYSTECTOMY    . JOINT REPLACEMENT Left    TKR  . left sinusplasty     . PARTIAL MASTECTOMY WITH NEEDLE LOCALIZATION Left 09/29/2015   Procedure: PARTIAL MASTECTOMY WITH NEEDLE LOCALIZATION;  Surgeon: Leonie Green, MD;  Location: [facility] ORS;  Service: General;  Laterality: Left;  . 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  Social History   Social History  . Marital status: Married    Spouse name: N/A  . Number of children: N/A  . Years of education: N/A   Occupational History  . Not on file.   Social History Main Topics  . Smoking status: Never Smoker  . Smokeless tobacco: Never Used  . Alcohol use No  . Drug use: No  . Sexual activity: Not on file   Other Topics Concern  . Not on file   Social History Narrative  . No narrative on file    FAMILY HISTORY: Family History  Problem Relation Age of Onset  . Breast cancer Paternal Grandmother     ALLERGIES:  is allergic to fentanyl; codeine; demerol [meperidine]; hydrocodone-acetaminophen; other; oxycodone; pentazocine; and tetracyclines & related.  MEDICATIONS:  Current Outpatient Prescriptions  Medication Sig Dispense Refill  . aspirin 81 MG tablet Take 81 mg by mouth at bedtime.    . cholecalciferol (VITAMIN D) 1000 units tablet Take 2,000 Units by mouth daily.     . Cyanocobalamin (B-12 COMPLIANCE INJECTION) 1000 MCG/ML KIT Inject 1 Syringe as directed every 30 (thirty) days.    Marland Kitchen docusate sodium (COLACE) 100 MG capsule Take 100 mg by mouth 2 (two) times daily as needed for mild constipation.    . fenofibrate 160 MG tablet Take 160 mg by mouth at bedtime.    Marland Kitchen  fexofenadine (ALLEGRA) 180 MG tablet Take 180 mg by mouth at bedtime.     . fluticasone (FLONASE) 50 MCG/ACT nasal spray Place 1 spray into the nose daily as needed for allergies.     . furosemide (LASIX) 20 MG tablet Take 20-40 mg by mouth every other day. Alternate between 1 a day and 2 a day    . glimepiride (AMARYL) 2 MG tablet Take 2 mg by mouth at bedtime.    Marland Kitchen Hyoscyamine Sulfate 0.375 MG TBCR Take 0.375 mg by mouth 2 (two) times daily.     . lansoprazole (PREVACID) 30 MG capsule Take 30 mg by mouth daily as needed (reflux).     Marland Kitchen levothyroxine (SYNTHROID, LEVOTHROID) 75 MCG  tablet Take 75 mcg by mouth daily before breakfast.     . metFORMIN (GLUCOPHAGE-XR) 750 MG 24 hr tablet Take 750 mg by mouth 2 (two) times daily.     . montelukast (SINGULAIR) 10 MG tablet Take 10 mg by mouth at bedtime.     . saccharomyces boulardii (FLORASTOR) 250 MG capsule Take 250 mg by mouth 2 (two) times daily.     . simvastatin (ZOCOR) 20 MG tablet Take 20 mg by mouth at bedtime.    . valsartan (DIOVAN) 160 MG tablet Take 160 mg by mouth every morning.     . escitalopram (LEXAPRO) 5 MG tablet Take 5 mg by mouth at bedtime.    . traMADol (ULTRAM) 50 MG tablet Take 50 mg by mouth every 6 (six) hours as needed.     No current facility-administered medications for this visit.       Marland Kitchen  PHYSICAL EXAMINATION: ECOG PERFORMANCE STATUS: 0 - Asymptomatic  Vitals:   10/12/15 0840  BP: 130/79  Pulse: 69  Resp: 18  Temp: 97.5 F (36.4 C)   Filed Weights   10/12/15 0840  Weight: 186 lb 4 oz (84.5 kg)    GENERAL: Well-nourished well-developed; Alert, no distress and comfortable.  Alone.  EYES: no pallor or icterus OROPHARYNX: no thrush or ulceration; good dentition  NECK: supple, no masses felt LYMPH:  no palpable lymphadenopathy in the cervical, axillary or inguinal regions LUNGS: clear to auscultation and  No wheeze or crackles HEART/CVS: regular rate & rhythm and no murmurs; No lower extremity edema ABDOMEN: abdomen soft, non-tender and normal bowel sounds Musculoskeletal:no cyanosis of digits and no clubbing  PSYCH: alert & oriented x 3 with fluent speech NEURO: no focal motor/sensory deficits SKIN:  no rashes or significant lesions  LABORATORY DATA:  I have reviewed the data as listed No results found for: WBC, HGB, HCT, MCV, PLT No results for input(s): NA, K, CL, CO2, GLUCOSE, BUN, CREATININE, CALCIUM, GFRNONAA, GFRAA, PROT, ALBUMIN, AST, ALT, ALKPHOS, BILITOT, BILIDIR, IBILI in the last 8760 hours.  RADIOGRAPHIC STUDIES: I have personally reviewed the radiological  images as listed and agreed with the findings in the report. Mm Breast Surgical Specimen  Result Date: 09/29/2015 CLINICAL DATA:  Specimen radiograph status post left breast lumpectomy. EXAM: SPECIMEN RADIOGRAPH OF THE LEFT BREAST COMPARISON:  Previous exam(s). FINDINGS: Status post excision of the left breast. The wire tips and biopsy marker clip are present and are marked for pathology. Multiple calcifications are seen within the specimen radiograph. This was communicated with Dr. Tamala Julian in the OR at 2:25 p.m. IMPRESSION: Specimen radiograph of the left breast. Electronically Signed   By: Ammie Ferrier M.D.   On: 09/29/2015 14:55   Mm Lt Plc Breast Loc Dev   1st  Lesion  Inc Mammo Guide  Result Date: 09/29/2015 CLINICAL DATA:  75 year old female presenting for wire localization of DCIS in the left breast. EXAM: NEEDLE LOCALIZATION OF THE LEFT BREAST WITH MAMMO GUIDANCE COMPARISON:  Previous exams. FINDINGS: Patient presents for needle localization prior to lumpectomy of the left breast. I met with the patient and we discussed the procedure of needle localization including benefits and alternatives. We discussed the high likelihood of a successful procedure. We discussed the risks of the procedure, including infection, bleeding, tissue injury, and further surgery. Informed, written consent was given. The usual time-out protocol was performed immediately prior to the procedure. Using mammographic guidance, sterile technique, 1% lidocaine with the linear group of calcifications in the superior left breast for bracketed. A 9 cm modified Kopans needle wire was placed at the posterior margin of the calcifications and a 7 cm Kopan's needle was placed on the anterior margin of the calcifications using a superior approach. The plan for localization was discussed with Dr. Tamala Julian prior to the procedure. The images were marked for Dr. Tamala Julian. IMPRESSION: Needle localization left breast. No apparent complications.  Electronically Signed   By: Ammie Ferrier M.D.   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 Awaiting reexcision next week. Recommend evaluation with radiation ; will need radiation postlumpectomy. Given the absence of invasive component- no role for any systemic chemotherapy.  # Also discussed the role of  antihormone therapy /Arimidex for primary prevention of breast cancer; discussed the potential mechanisms of action.   # Patient will follow-up with me in approximately 2 months /no labs - to discuss/proceed with aromatase inhibitor.   Thank you Drs. Lorelee Cover for allowing me to participate in the care of your pleasant patient. Please do not hesitate to contact me with questions or concerns in the interim.    All questions were answered. The patient knows to call the clinic with any problems, questions or concerns.       Cammie Sickle, MD 10/12/2015 5:03 PM

## 2015-10-12 NOTE — Patient Instructions (Signed)
  Your procedure is scheduled on: 10-17-15 Report to Same Day Surgery 2nd floor medical mall To find out your arrival time please call [phone] between 1PM - 3PM on 10-13-15  Remember: Instructions that are not followed completely may result in serious medical risk, up to and including death, or upon the discretion of your surgeon and anesthesiologist your surgery may need to be rescheduled.    _x___ 1. Do not eat food or drink liquids after midnight. No gum chewing or hard candies.     __x__ 2. No Alcohol for 24 hours before or after surgery.   __x__3. No Smoking for 24 prior to surgery.   ____  4. Bring all medications with you on the day of surgery if instructed.    __x__ 5. Notify your doctor if there is any change in your medical condition     (cold, fever, infections).     Do not wear jewelry, make-up, hairpins, clips or nail polish.  Do not wear lotions, powders, or perfumes. You may wear deodorant.  Do not shave 48 hours prior to surgery. Men may shave face and neck.  Do not bring valuables to the hospital.    [facility] is not responsible for any belongings or valuables.               Contacts, dentures or bridgework may not be worn into surgery.  Leave your suitcase in the car. After surgery it may be brought to your room.  For patients admitted to the hospital, discharge time is determined by your treatment team.   Patients discharged the day of surgery will not be allowed to drive home.    Please read over the following fact sheets that you were given:   [facility] Preparing for Surgery and or MRSA Information   _x___ Take these medicines the morning of surgery with A SIP OF WATER:    1. LEVOTHYROXINE  2. DIOVAN  3. PREVACID  4. TAKE A PREVACID Monday NIGHT BEFORE BED  5.  6.  ____ Fleet Enema (as directed)   ____ Use CHG Soap or sage wipes as directed on instruction sheet   ____ Use inhalers on the day of surgery and bring to hospital day of  surgery  __X__ Stop metformin 2 days prior to surgery-LAST DOSE ON Saturday, 10-14-15    ____ Take 1/2 of usual insulin dose the night before surgery and none on the morning of surgery.   _X___ Stop aspirin or coumadin, or plavix-PT STOPPED ASA PER DR SMITHS INSTRUCTIONS ON 10-09-15  _x__ Stop Anti-inflammatories such as Advil, Aleve, Ibuprofen, Motrin, Naproxen,          Naprosyn, Goodies powders or aspirin products. Ok to take Tylenol OR TRAMADOL   _X___ Stop supplements until after surgery-STOP FLORASTOR NOW   ____ Bring C-Pap to the hospital.

## 2015-10-12 NOTE — Progress Notes (Signed)
  Oncology Nurse Navigator Documentation  Navigator Location: [facility]-Med Onc (10/12/15 1000) Navigator Encounter Type: Initial MedOnc (10/12/15 1000)           Patient Visit Type: MedOnc (10/12/15 1000) Treatment Phase: Pre-Tx/Tx Discussion (10/12/15 1000) Barriers/Navigation Needs: No barriers at this time (10/12/15 1000)                Acuity: Level 2 (10/12/15 1000)         Time Spent with Patient: 77 (10/12/15 1000)   Met with patient today during her initial medical oncology consultation with Dr. Rogue Bussing.  Patient has DCIS with positive margins.  She is scheduled for re-excision on Tuesday.  Appointments were made to consult with Radiation Oncology and to see him again in 2 months to initiate antihormonal therapy.  She is to call if she has any questions or needs.

## 2015-10-12 NOTE — Assessment & Plan Note (Addendum)
DCIS/papillary carcinoma in situ-  left breast status post excision positive margin. Awaiting reexcision next week. Recommend evaluation with radiation ; will need radiation postlumpectomy. Given the absence of invasive component- no role for any systemic chemotherapy.  # Also discussed the role of  antihormone therapy /Arimidex for primary prevention of breast cancer; discussed the potential mechanisms of action.   # Patient will follow-up with me in approximately 2 months /no labs - to discuss/proceed with aromatase inhibitor.   Thank you Drs. Lorelee Cover for allowing me to participate in the care of your pleasant patient. Please do not hesitate to contact me with questions or concerns in the interim.

## 2015-10-12 NOTE — Progress Notes (Signed)
Patient here today as new evaluation regarding DCIS.  Referred by Dr. Hall Busing.

## 2015-10-17 ENCOUNTER — Ambulatory Visit: Payer: Medicare Other | Admitting: Certified Registered Nurse Anesthetist

## 2015-10-17 ENCOUNTER — Encounter: Payer: Self-pay | Admitting: *Deleted

## 2015-10-17 ENCOUNTER — Ambulatory Visit
Admission: RE | Admit: 2015-10-17 | Discharge: 2015-10-17 | Disposition: A | Discharge status: Home / Self Care | Payer: Medicare Other | Source: Ambulatory Visit | Attending: Surgery | Admitting: Surgery

## 2015-10-17 ENCOUNTER — Encounter
Admission: RE | Disposition: A | Discharge status: Home / Self Care | Payer: Self-pay | Source: Ambulatory Visit | Attending: Surgery

## 2015-10-17 DIAGNOSIS — Z881 Allergy status to other antibiotic agents status: Secondary | ICD-10-CM | POA: Diagnosis not present

## 2015-10-17 DIAGNOSIS — Z7982 Long term (current) use of aspirin: Secondary | ICD-10-CM | POA: Insufficient documentation

## 2015-10-17 DIAGNOSIS — Z9889 Other specified postprocedural states: Secondary | ICD-10-CM | POA: Diagnosis not present

## 2015-10-17 DIAGNOSIS — Z7951 Long term (current) use of inhaled steroids: Secondary | ICD-10-CM | POA: Diagnosis not present

## 2015-10-17 DIAGNOSIS — Z90710 Acquired absence of both cervix and uterus: Secondary | ICD-10-CM | POA: Insufficient documentation

## 2015-10-17 DIAGNOSIS — G4730 Sleep apnea, unspecified: Secondary | ICD-10-CM | POA: Diagnosis not present

## 2015-10-17 DIAGNOSIS — K219 Gastro-esophageal reflux disease without esophagitis: Secondary | ICD-10-CM | POA: Insufficient documentation

## 2015-10-17 DIAGNOSIS — E079 Disorder of thyroid, unspecified: Secondary | ICD-10-CM | POA: Insufficient documentation

## 2015-10-17 DIAGNOSIS — E785 Hyperlipidemia, unspecified: Secondary | ICD-10-CM | POA: Diagnosis not present

## 2015-10-17 DIAGNOSIS — I110 Hypertensive heart disease with heart failure: Secondary | ICD-10-CM | POA: Diagnosis not present

## 2015-10-17 DIAGNOSIS — E119 Type 2 diabetes mellitus without complications: Secondary | ICD-10-CM | POA: Insufficient documentation

## 2015-10-17 DIAGNOSIS — K589 Irritable bowel syndrome without diarrhea: Secondary | ICD-10-CM | POA: Insufficient documentation

## 2015-10-17 DIAGNOSIS — Z803 Family history of malignant neoplasm of breast: Secondary | ICD-10-CM | POA: Insufficient documentation

## 2015-10-17 DIAGNOSIS — Z79899 Other long term (current) drug therapy: Secondary | ICD-10-CM | POA: Diagnosis not present

## 2015-10-17 DIAGNOSIS — Z888 Allergy status to other drugs, medicaments and biological substances status: Secondary | ICD-10-CM | POA: Insufficient documentation

## 2015-10-17 DIAGNOSIS — Z885 Allergy status to narcotic agent status: Secondary | ICD-10-CM | POA: Diagnosis not present

## 2015-10-17 DIAGNOSIS — Z9049 Acquired absence of other specified parts of digestive tract: Secondary | ICD-10-CM | POA: Insufficient documentation

## 2015-10-17 DIAGNOSIS — Z7984 Long term (current) use of oral hypoglycemic drugs: Secondary | ICD-10-CM | POA: Diagnosis not present

## 2015-10-17 DIAGNOSIS — C50912 Malignant neoplasm of unspecified site of left female breast: Secondary | ICD-10-CM | POA: Diagnosis present

## 2015-10-17 HISTORY — PX: MASTECTOMY, PARTIAL: SHX709

## 2015-10-17 HISTORY — PX: BREAST LUMPECTOMY: SHX2

## 2015-10-17 LAB — GLUCOSE, CAPILLARY
Glucose-Capillary: 161 mg/dL — ABNORMAL HIGH (ref 65–99)
Glucose-Capillary: 132 mg/dL — ABNORMAL HIGH (ref 65–99)

## 2015-10-17 SURGERY — MASTECTOMY PARTIAL
Anesthesia: General | Laterality: Left

## 2015-10-17 MED ORDER — GLYCOPYRROLATE 0.2 MG/ML IJ SOLN
INTRAMUSCULAR | Status: DC | PRN
  Administered 2015-10-17 (×2): 0.1 mg via INTRAVENOUS

## 2015-10-17 MED ORDER — FENTANYL CITRATE (PF) 100 MCG/2ML IJ SOLN
INTRAMUSCULAR | Status: DC | PRN
  Administered 2015-10-17 (×2): 25 ug via INTRAVENOUS

## 2015-10-17 MED ORDER — SODIUM CHLORIDE 0.9 % IV SOLN
INTRAVENOUS | Status: DC
  Administered 2015-10-17: 12:00:00 via INTRAVENOUS

## 2015-10-17 MED ORDER — FENTANYL CITRATE (PF) 100 MCG/2ML IJ SOLN
25.0000 ug | INTRAMUSCULAR | Status: AC | PRN
  Administered 2015-10-17 (×6): 25 ug via INTRAVENOUS

## 2015-10-17 MED ORDER — ACETAMINOPHEN 325 MG PO TABS
ORAL_TABLET | ORAL | Status: AC
  Filled 2015-10-17: qty 2

## 2015-10-17 MED ORDER — TRAMADOL HCL 50 MG PO TABS
ORAL_TABLET | ORAL | Status: AC
  Filled 2015-10-17: qty 1

## 2015-10-17 MED ORDER — BUPIVACAINE-EPINEPHRINE (PF) 0.5% -1:200000 IJ SOLN
INTRAMUSCULAR | Status: AC
  Filled 2015-10-17: qty 30

## 2015-10-17 MED ORDER — PROPOFOL 10 MG/ML IV BOLUS
INTRAVENOUS | Status: DC | PRN
  Administered 2015-10-17: 100 mg via INTRAVENOUS
  Administered 2015-10-17: 30 mg via INTRAVENOUS

## 2015-10-17 MED ORDER — TRAMADOL HCL 50 MG PO TABS
50.0000 mg | ORAL_TABLET | Freq: Once | ORAL | Status: AC
  Administered 2015-10-17: 50 mg via ORAL

## 2015-10-17 MED ORDER — BUPIVACAINE HCL (PF) 0.5 % IJ SOLN
INTRAMUSCULAR | Status: DC | PRN
  Administered 2015-10-17: 10 mL

## 2015-10-17 MED ORDER — ACETAMINOPHEN 325 MG PO TABS
650.0000 mg | ORAL_TABLET | Freq: Once | ORAL | Status: AC
  Administered 2015-10-17: 650 mg via ORAL

## 2015-10-17 MED ORDER — ROCURONIUM BROMIDE 100 MG/10ML IV SOLN
INTRAVENOUS | Status: DC | PRN
  Administered 2015-10-17: 30 mg via INTRAVENOUS

## 2015-10-17 MED ORDER — FENTANYL CITRATE (PF) 100 MCG/2ML IJ SOLN
INTRAMUSCULAR | Status: AC
  Administered 2015-10-17: 25 ug
  Filled 2015-10-17: qty 2

## 2015-10-17 MED ORDER — ONDANSETRON HCL 4 MG/2ML IJ SOLN: 4.0000 mg | Freq: Once | INTRAMUSCULAR | Status: DC | PRN

## 2015-10-17 MED ORDER — EPHEDRINE SULFATE 50 MG/ML IJ SOLN
INTRAMUSCULAR | Status: DC | PRN
  Administered 2015-10-17 (×2): 10 mg via INTRAVENOUS

## 2015-10-17 MED ORDER — FENTANYL CITRATE (PF) 100 MCG/2ML IJ SOLN
INTRAMUSCULAR | Status: AC
  Filled 2015-10-17: qty 2

## 2015-10-17 SURGICAL SUPPLY — 29 items
BLADE SURG 15 STRL LF DISP TIS (BLADE) ×1 IMPLANT
BLADE SURG 15 STRL SS (BLADE) ×2
CANISTER SUCT 1200ML W/VALVE (MISCELLANEOUS) ×2 IMPLANT
CHLORAPREP W/TINT 26ML (MISCELLANEOUS) ×2 IMPLANT
CNTNR SPEC 2.5X3XGRAD LEK (MISCELLANEOUS) ×1
CONT SPEC 4OZ STER OR WHT (MISCELLANEOUS) ×1
CONT SPEC 4OZ STRL OR WHT (MISCELLANEOUS) ×1
CONTAINER SPEC 2.5X3XGRAD LEK (MISCELLANEOUS) ×1 IMPLANT
DRAPE LAPAROTOMY 77X122 PED (DRAPES) ×2 IMPLANT
ELECT REM PT RETURN 9FT ADLT (ELECTROSURGICAL) ×2
ELECTRODE REM PT RTRN 9FT ADLT (ELECTROSURGICAL) ×1 IMPLANT
GLOVE BIO SURGEON STRL SZ7.5 (GLOVE) ×7 IMPLANT
GOWN STRL REUS W/ TWL LRG LVL3 (GOWN DISPOSABLE) ×2 IMPLANT
GOWN STRL REUS W/TWL LRG LVL3 (GOWN DISPOSABLE) ×4
KIT RM TURNOVER STRD PROC AR (KITS) ×2 IMPLANT
LABEL OR SOLS (LABEL) ×2 IMPLANT
LIQUID BAND (GAUZE/BANDAGES/DRESSINGS) ×2 IMPLANT
MARGIN MAP 10MM (MISCELLANEOUS) ×2 IMPLANT
NDL HYPO 25X1 1.5 SAFETY (NEEDLE) ×1 IMPLANT
NEEDLE HYPO 25X1 1.5 SAFETY (NEEDLE) ×2 IMPLANT
PACK BASIN MINOR ARMC (MISCELLANEOUS) ×2 IMPLANT
SUT CHROMIC 4 0 RB 1X27 (SUTURE) ×2 IMPLANT
SUT ETHILON 3-0 FS-10 30 BLK (SUTURE) ×2
SUT MNCRL 4-0 (SUTURE) ×2
SUT MNCRL 4-0 27XMFL (SUTURE) ×1
SUTURE EHLN 3-0 FS-10 30 BLK (SUTURE) ×1 IMPLANT
SUTURE MNCRL 4-0 27XMF (SUTURE) ×1 IMPLANT
SYRINGE 10CC LL (SYRINGE) ×2 IMPLANT
WATER STERILE IRR 1000ML POUR (IV SOLUTION) ×2 IMPLANT

## 2015-10-17 NOTE — Anesthesia Postprocedure Evaluation (Signed)
Anesthesia Post Note  Patient: Annette Foster  Procedure(s) Performed: Procedure(s) (LRB): MASTECTOMY PARTIAL REVISION (Left)  Patient location during evaluation: PACU Anesthesia Type: General Level of consciousness: awake and alert Pain management: pain level controlled Vital Signs Assessment: post-procedure vital signs reviewed and stable Respiratory status: spontaneous breathing, nonlabored ventilation, respiratory function stable and patient connected to nasal cannula oxygen Cardiovascular status: blood pressure returned to baseline and stable Postop Assessment: no signs of nausea or vomiting Anesthetic complications: no    Last Vitals:  Vitals:   10/17/15 1425 10/17/15 1430  BP: (!) 114/51   Pulse: 63 60  Resp: 11 10  Temp:      Last Pain:  Vitals:   10/17/15 1430  TempSrc:   PainSc: 2                  Molli Barrows

## 2015-10-17 NOTE — OR Nursing (Signed)
Dr. Smith into see pt and family.  OK to dc home. 

## 2015-10-17 NOTE — Anesthesia Preprocedure Evaluation (Addendum)
Anesthesia Evaluation  Patient identified by MRN, date of birth, ID band Patient awake    Reviewed: Allergy & Precautions, H&P , NPO status , Patient's Chart, lab work & pertinent test results, reviewed documented beta blocker date and time   History of Anesthesia Complications (+) PONV and history of anesthetic complications  Airway Mallampati: III  TM Distance: >3 FB Neck ROM: full    Dental  (+) Upper Dentures, Lower Dentures   Pulmonary neg pulmonary ROS, asthma , sleep apnea ,    Pulmonary exam normal        Cardiovascular Exercise Tolerance: Poor +CHF  negative cardio ROS Normal cardiovascular exam+ Valvular Problems/Murmurs  Rhythm:regular Rate:Normal     Neuro/Psych negative neurological ROS  negative psych ROS   GI/Hepatic negative GI ROS, Neg liver ROS, GERD  Medicated and Controlled,IBS   Endo/Other  negative endocrine ROSdiabetes, Well Controlled, Type 2, Oral Hypoglycemic AgentsHypothyroidism   Renal/GU negative Renal ROS  negative genitourinary   Musculoskeletal negative musculoskeletal ROS (+)   Abdominal (+) + obese,   Peds negative pediatric ROS (+)  Hematology negative hematology ROS (+)   Anesthesia Other Findings   Reproductive/Obstetrics negative OB ROS                            Anesthesia Physical  Anesthesia Plan  ASA: III  Anesthesia Plan: General   Post-op Pain Management:    Induction: Intravenous  Airway Management Planned: Oral ETT  Additional Equipment:   Intra-op Plan: Delibrate Circulatory arrest per surgeon request  Post-operative Plan: Extubation in OR  Informed Consent: I have reviewed the patients History and Physical, chart, labs and discussed the procedure including the risks, benefits and alternatives for the proposed anesthesia with the patient or authorized representative who has indicated his/her understanding and acceptance.    Dental advisory given  Plan Discussed with: CRNA and Surgeon  Anesthesia Plan Comments: (Pt reports a previous adverse reaction to fentanyl.  With a previous cholecystectomy she had a latent respiratory failure requiring Assistance and reports being told she had a reaction to the medication.  Based on her report of the event, she appears to be Very sensitive to opioids in general.  She accepts administration of fentanyl today and we have discussed the situation at  Length and the risks and benefits.  We will plan on judicious use of opioids as discussed.  JA      The above not was taken from the patient's last surgery in August.  She did well post surgery and she agrees with the use of Fentanyl for this procedure.  She understands risks and benefits.)       Anesthesia Quick Evaluation

## 2015-10-17 NOTE — H&P (Signed)
  She had recent excision of left breast mass with ductal carcinoma in situ. Final margins demonstrated involvement of the cranial margin. Reexcision was recommended for further treatment.  She reports no change in overall condition since the office visit. She has been off of aspirin for more than a week.  The left side was marked YES. The old scar was identified.  I discussed the plan for surgery.

## 2015-10-17 NOTE — Op Note (Signed)
OPERATIVE REPORT  PREOPERATIVE  DIAGNOSIS: . Ductal carcinoma in situ of left breast  POSTOPERATIVE DIAGNOSIS: . Ductal carcinoma in situ of left breast  PROCEDURE: . Left partial mastectomy  ANESTHESIA:  General  SURGEON: Rochel Brome  MD   INDICATIONS: . She had recent findings of ductal carcinoma in situ of the left breast. She did have a recent left partial mastectomy. The final pathology demonstrated involvement of the cranial margin. Surgery was recommended to revise the partial mastectomy to re-excise the cranial margin.  With the patient on the operating table in the supine position under general anesthesia the left arm was placed on a lateral arm support. The left breast was prepared with ChloraPrep and draped in a sterile manner.  A transversely oriented incision was made at the site of the old scar from 11:00 to 1:00. This incision was begun with a scalpel and then reopened with blunt and sharp dissection. Some old suture material was removed. There was a seroma which was aspirated. With retraction the seroma wall was examined. There was no grossly apparent mass. Next the cranial margin was excised by scoring the wall of the seroma with the electrocautery unit with a somewhat circular incision of approximately 3 cm in diameter. The dissection was carried out to remove a portion of tissue approximately 8-10 mm in thickness. As it was dissected free from surrounding tissues, markers were attached with 3-0 nylon sutures to mark the medial lateral anterior deep and cranial margins. This was submitted for pathology. The wound was inspected and several small bleeding points were cauterized. The subcutaneous tissues were infiltrated with half percent Sensorcaine with epinephrine. The wound was closed with interrupted inverted 4-0 chromic subcutaneous sutures and also with a running 4-0 Monocryl subcuticular suture.  The pathologist called to report there was no grossly apparent mass within the  specimen and the tissues were submitted for routine pathology.  Next the wound was treated with LiquiBand and allowed to dry. The patient tolerated surgery satisfactorily and was then prepared for transfer to the recovery room  [facility].D.

## 2015-10-17 NOTE — Discharge Instructions (Addendum)
AMBULATORY SURGERY  DISCHARGE INSTRUCTIONS   1) The drugs that you were given will stay in your system until tomorrow so for the next 24 hours you should not:  A) Drive an automobile B) Make any legal decisions C) Drink any alcoholic beverage   2) You may resume regular meals tomorrow.  Today it is better to start with liquids and gradually work up to solid foods.  You may eat anything you prefer, but it is better to start with liquids, then soup and crackers, and gradually work up to solid foods.   3) Please notify your doctor immediately if you have any unusual bleeding, trouble breathing, redness and pain at the surgery site, drainage, fever, or pain not relieved by medication.   4) Additional Instructions: Take Tylenol and/or tramadol as needed for pain.  May resume aspirin on Thursday.  May shower.  Wear bra as desired for comfort and support.

## 2015-10-17 NOTE — Transfer of Care (Signed)
Immediate Anesthesia Transfer of Care Note  Patient: Annette Foster  Procedure(s) Performed: Procedure(s): MASTECTOMY PARTIAL REVISION (Left)  Patient Location: PACU  Anesthesia Type:General  Level of Consciousness: awake  Airway & Oxygen Therapy: Patient Spontanous Breathing and Patient connected to nasal cannula oxygen  Post-op Assessment: Report given to RN  Post vital signs: stable  Last Vitals:  Vitals:   10/17/15 1340 10/17/15 1345  BP:  (!) 128/51  Pulse: 73 71  Resp: (!) 24 18  Temp:      Last Pain:  Vitals:   10/17/15 1129  TempSrc: Tympanic         Complications: No apparent anesthesia complications

## 2015-10-17 NOTE — Anesthesia Procedure Notes (Addendum)
Procedure Name: Intubation Date/Time: 10/17/2015 12:33 PM Performed by: Darlyne Russian Pre-anesthesia Checklist: Patient identified, Emergency Drugs available, Suction available and Patient being monitored Patient Re-evaluated:Patient Re-evaluated prior to inductionOxygen Delivery Method: Circle system utilized Preoxygenation: Pre-oxygenation with 100% oxygen Intubation Type: IV induction Ventilation: Mask ventilation without difficulty Laryngoscope Size: Mac and 3 Grade View: Grade II Tube size: 7.0 mm Number of attempts: 1 Placement Confirmation: ETT inserted through vocal cords under direct vision,  positive ETCO2 and breath sounds checked- equal and bilateral Secured at: 21 cm Tube secured with: Tape Dental Injury: Teeth and Oropharynx as per pre-operative assessment

## 2015-10-18 ENCOUNTER — Institutional Professional Consult (permissible substitution): Payer: Medicare Other | Admitting: Radiation Oncology

## 2015-10-18 LAB — SURGICAL PATHOLOGY

## 2015-11-01 ENCOUNTER — Ambulatory Visit: Payer: Medicare Other | Admitting: Radiation Oncology

## 2015-11-01 ENCOUNTER — Ambulatory Visit
Admission: RE | Admit: 2015-11-01 | Discharge: 2015-11-01 | Disposition: A | Discharge status: Home / Self Care | Payer: Medicare Other | Source: Ambulatory Visit | Attending: Radiation Oncology | Admitting: Radiation Oncology

## 2015-11-01 ENCOUNTER — Encounter: Payer: Self-pay | Admitting: Radiation Oncology

## 2015-11-01 VITALS — BP 132/60 | HR 66 | Temp 97.0°F | Resp 20 | Wt 185.7 lb

## 2015-11-01 DIAGNOSIS — D0512 Intraductal carcinoma in situ of left breast: Secondary | ICD-10-CM | POA: Insufficient documentation

## 2015-11-01 DIAGNOSIS — Z7984 Long term (current) use of oral hypoglycemic drugs: Secondary | ICD-10-CM | POA: Diagnosis not present

## 2015-11-01 DIAGNOSIS — E119 Type 2 diabetes mellitus without complications: Secondary | ICD-10-CM | POA: Insufficient documentation

## 2015-11-01 DIAGNOSIS — G4730 Sleep apnea, unspecified: Secondary | ICD-10-CM | POA: Diagnosis not present

## 2015-11-01 DIAGNOSIS — E039 Hypothyroidism, unspecified: Secondary | ICD-10-CM | POA: Insufficient documentation

## 2015-11-01 DIAGNOSIS — I509 Heart failure, unspecified: Secondary | ICD-10-CM | POA: Insufficient documentation

## 2015-11-01 DIAGNOSIS — R011 Cardiac murmur, unspecified: Secondary | ICD-10-CM | POA: Diagnosis not present

## 2015-11-01 DIAGNOSIS — K589 Irritable bowel syndrome without diarrhea: Secondary | ICD-10-CM | POA: Diagnosis not present

## 2015-11-01 DIAGNOSIS — Z803 Family history of malignant neoplasm of breast: Secondary | ICD-10-CM | POA: Insufficient documentation

## 2015-11-01 DIAGNOSIS — K219 Gastro-esophageal reflux disease without esophagitis: Secondary | ICD-10-CM | POA: Diagnosis not present

## 2015-11-01 DIAGNOSIS — Z170 Estrogen receptor positive status [ER+]: Secondary | ICD-10-CM | POA: Diagnosis not present

## 2015-11-01 DIAGNOSIS — Z7982 Long term (current) use of aspirin: Secondary | ICD-10-CM | POA: Insufficient documentation

## 2015-11-01 DIAGNOSIS — Z9012 Acquired absence of left breast and nipple: Secondary | ICD-10-CM | POA: Diagnosis not present

## 2015-11-01 DIAGNOSIS — Z510 Encounter for antineoplastic radiation therapy: Secondary | ICD-10-CM | POA: Insufficient documentation

## 2015-11-01 DIAGNOSIS — Z79899 Other long term (current) drug therapy: Secondary | ICD-10-CM | POA: Insufficient documentation

## 2015-11-01 DIAGNOSIS — J45909 Unspecified asthma, uncomplicated: Secondary | ICD-10-CM | POA: Insufficient documentation

## 2015-11-01 NOTE — Consult Note (Signed)
NEW PATIENT EVALUATION  Name: Annette Foster  MRN: [id number]  Date:   11/01/2015     DOB: August 22, 1940   This 75 y.o. 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  REFERRING PHYSICIAN: Albina Billet, MD  CHIEF COMPLAINT:  Chief Complaint  Patient presents with  . Breast Cancer    Pt is here for initial consultation of breast.    DIAGNOSIS: The encounter diagnosis was DCIS (ductal carcinoma in situ) of breast, left.   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 Seen were coarse heterogeneous calcifications in the upper inner quadrant spanning approximately 2.5 cm. She would have the went on to have biopsy which was positive for ER/PR positive ductal carcinoma in situ overall grade 2. She went on to have a wide local excision showing a span of 4.3 cm overall grade 2 ductal carcinoma in situ with cranial margin positive. There was papillary carcinoma in situ as well as micropapillary and cribriform ductal carcinoma in situ. She did have reexcision with clear margins. Patient is seen today for consideration of adjuvant radiation therapy. She is doing well. She specifically denies breast tenderness cough or bone pain.  PLANNED TREATMENT REGIMEN: Hypofractionated whole breast radiation  PAST MEDICAL HISTORY:  has a past medical history of Allergy; Asthma; Cataract; CHF (congestive heart failure) ([place]); Collagenous colitis; Diabetes mellitus without complication ([place]); Fibrocystic breast; GERD (gastroesophageal reflux disease); Heart murmur; Hypothyroidism; IBS  (irritable bowel syndrome); PONV (postoperative nausea and vomiting); and Sleep apnea.    PAST SURGICAL HISTORY:  Past Surgical History:  Procedure Laterality Date  . ABDOMINAL SURGERY    . BREAST BIOPSY Bilateral    negative  . BREAST 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/2015   stereo pending  . CARDIAC SURGERY    . CATARACT EXTRACTION Right   . CHOLECYSTECTOMY    . JOINT REPLACEMENT Left    TKR  . left sinusplasty     . MASTECTOMY, PARTIAL Left 10/17/2015   Procedure: MASTECTOMY PARTIAL REVISION;  Surgeon: Leonie Green, MD;  Location: [facility] ORS;  Service: General;  Laterality: Left;  . PARTIAL MASTECTOMY WITH NEEDLE LOCALIZATION Left 09/29/2015   Procedure: PARTIAL MASTECTOMY WITH NEEDLE LOCALIZATION;  Surgeon: Leonie Green, MD;  Location: [facility] ORS;  Service: General;  Laterality: Left;  . TOTAL ABDOMINAL HYSTERECTOMY W/ BILATERAL SALPINGOOPHORECTOMY      FAMILY HISTORY: family history includes Breast cancer in her paternal grandmother.  SOCIAL HISTORY:  reports that she has never smoked. She has never used smokeless tobacco. She reports that she does not drink alcohol or use drugs.  ALLERGIES: Fentanyl; Codeine; Demerol [meperidine]; Hydrocodone-acetaminophen; Other; Oxycodone; Pentazocine; and Tetracyclines & related  MEDICATIONS:  Current Outpatient Prescriptions  Medication Sig Dispense Refill  . aspirin 81 MG tablet Take 81 mg by mouth at bedtime.    . cholecalciferol (VITAMIN D) 1000 units tablet Take 2,000 Units by mouth daily.     . Cyanocobalamin (B-12 COMPLIANCE INJECTION) 1000 MCG/ML KIT Inject 1 Syringe as directed every 30 (thirty) days.    Marland Kitchen docusate sodium (COLACE) 100 MG capsule Take 100 mg by mouth 2 (two) times daily as needed  for mild constipation.    Marland Kitchen escitalopram (LEXAPRO) 5 MG tablet Take 5 mg by mouth at bedtime.    . fenofibrate 160 MG tablet Take 160 mg by mouth at bedtime.    . fexofenadine (ALLEGRA) 180 MG tablet Take 180 mg by mouth at bedtime.     .  fluticasone (FLONASE) 50 MCG/ACT nasal spray Place 1 spray into the nose daily as needed for allergies.     . furosemide (LASIX) 20 MG tablet Take 20-40 mg by mouth every other day. Alternate between 1 a day and 2 a day    . glimepiride (AMARYL) 2 MG tablet Take 2 mg by mouth at bedtime.    Marland Kitchen Hyoscyamine Sulfate 0.375 MG TBCR Take 0.375 mg by mouth 2 (two) times daily.     . lansoprazole (PREVACID) 30 MG capsule Take 30 mg by mouth daily as needed (reflux).     Marland Kitchen levothyroxine (SYNTHROID, LEVOTHROID) 75 MCG tablet Take 75 mcg by mouth daily before breakfast.     . metFORMIN (GLUCOPHAGE-XR) 750 MG 24 hr tablet Take 750 mg by mouth 2 (two) times daily.     . montelukast (SINGULAIR) 10 MG tablet Take 10 mg by mouth at bedtime.     . saccharomyces boulardii (FLORASTOR) 250 MG capsule Take 250 mg by mouth 2 (two) times daily.     . simvastatin (ZOCOR) 20 MG tablet Take 20 mg by mouth at bedtime.    . traMADol (ULTRAM) 50 MG tablet Take 50 mg by mouth every 6 (six) hours as needed.    . valsartan (DIOVAN) 160 MG tablet Take 160 mg by mouth every morning.      No current facility-administered medications for this encounter.     ECOG PERFORMANCE STATUS:  0 - Asymptomatic  REVIEW OF SYSTEMS: Patient denies any weight loss, fatigue, weakness, fever, chills or night sweats. Patient denies any loss of vision, blurred vision. Patient denies any ringing  of the ears or hearing loss. No irregular heartbeat. Patient denies heart murmur or history of fainting. Patient denies any chest pain or pain radiating to her upper extremities. Patient denies any shortness of breath, difficulty breathing at night, cough or hemoptysis. Patient denies any swelling in the lower legs. Patient denies any nausea vomiting, vomiting of blood, or coffee ground material in the vomitus. Patient denies any stomach pain. Patient states has had normal bowel movements no significant constipation or diarrhea. Patient denies any dysuria,  hematuria or significant nocturia. Patient denies any problems walking, swelling in the joints or loss of balance. Patient denies any skin changes, loss of hair or loss of weight. Patient denies any excessive worrying or anxiety or significant depression. Patient denies any problems with insomnia. Patient denies excessive thirst, polyuria, polydipsia. Patient denies any swollen glands, patient denies easy bruising or easy bleeding. Patient denies any recent infections, allergies or URI. Patient "s visual fields have not changed significantly in recent time.   PHYSICAL EXAM: BP 132/60   Pulse 66   Temp 97 F (36.1 C)   Resp 20   Wt 185 lb 11.8 oz (84.2 kg)   LMP  (LMP Unknown)   BMI 32.90 kg/m  Well-developed female in NAD. Her left breast has a wide local excision scar approximately 12:00 position which is healed well. No dominant mass or nodularity is noted in either breast in 2 positions examined. No axillary or supra clavicular adenopathy is appreciated. Well-developed well-nourished patient in NAD. HEENT reveals PERLA, EOMI, discs not visualized.  Oral cavity is clear. No oral mucosal lesions are identified. Neck is clear without evidence of cervical or supraclavicular adenopathy. Lungs are clear to A&P. Cardiac examination is essentially unremarkable with regular rate and rhythm without murmur rub or thrill. Abdomen is benign with no organomegaly or masses noted. Motor sensory and DTR levels are equal and symmetric in the upper and lower extremities. Cranial nerves II through XII are grossly intact. Proprioception is intact. No peripheral adenopathy or edema is identified. No motor or sensory levels are noted. Crude visual fields are within normal range.  LABORATORY DATA: Pathology reports reviewed    RADIOLOGY RESULTS: Mammograms and ultrasound reviewed   IMPRESSION: Stage 0 ductal carcinoma in situ of the left breast status post wide local excision and reexcision in 75 year old  female  PLAN: Based on the large span of the DCIS I believe adjuvant radiation therapy would be beneficial and I have recommended a course of whole breast radiation. Based on her breast size can treat with a hypofractionated course of treatment. Also would boost her scar another 1600 cGy using electrons based on the questionable margin initially and reexcision margin. Risks and benefits of treatment including skin reaction fatigue alteration of blood counts potential for inclusion of superficial lung all were discussed in detail with the patient. I personally ordered CT simulation for early next week. Patient also will be candidate for tamoxifen therapy after completion of radiation.  I would like to take this opportunity to thank you for allowing me to participate in the care of your patient.Armstead Peaks., MD

## 2015-11-06 ENCOUNTER — Ambulatory Visit
Admission: RE | Admit: 2015-11-06 | Discharge: 2015-11-06 | Disposition: A | Discharge status: Home / Self Care | Payer: Medicare Other | Source: Ambulatory Visit | Attending: Radiation Oncology | Admitting: Radiation Oncology

## 2015-11-06 DIAGNOSIS — D0512 Intraductal carcinoma in situ of left breast: Secondary | ICD-10-CM | POA: Diagnosis not present

## 2015-11-07 DIAGNOSIS — D0512 Intraductal carcinoma in situ of left breast: Secondary | ICD-10-CM | POA: Diagnosis not present

## 2015-11-08 ENCOUNTER — Other Ambulatory Visit: Payer: Self-pay | Admitting: *Deleted

## 2015-11-08 DIAGNOSIS — D0512 Intraductal carcinoma in situ of left breast: Secondary | ICD-10-CM

## 2015-11-13 ENCOUNTER — Ambulatory Visit: Payer: Medicare Other

## 2015-11-14 ENCOUNTER — Ambulatory Visit: Payer: Medicare Other

## 2015-11-15 ENCOUNTER — Ambulatory Visit: Payer: Medicare Other

## 2015-11-16 ENCOUNTER — Ambulatory Visit: Payer: Medicare Other

## 2015-11-17 ENCOUNTER — Ambulatory Visit
Admission: RE | Admit: 2015-11-17 | Discharge: 2015-11-17 | Disposition: A | Discharge status: Home / Self Care | Payer: Medicare Other | Source: Ambulatory Visit | Attending: Radiation Oncology | Admitting: Radiation Oncology

## 2015-11-17 ENCOUNTER — Ambulatory Visit: Payer: Medicare Other

## 2015-11-17 DIAGNOSIS — D0512 Intraductal carcinoma in situ of left breast: Secondary | ICD-10-CM | POA: Diagnosis not present

## 2015-11-20 ENCOUNTER — Other Ambulatory Visit: Payer: Self-pay | Admitting: *Deleted

## 2015-11-20 ENCOUNTER — Ambulatory Visit
Admission: RE | Admit: 2015-11-20 | Discharge: 2015-11-20 | Disposition: A | Discharge status: Home / Self Care | Payer: Medicare Other | Source: Ambulatory Visit | Attending: Radiation Oncology | Admitting: Radiation Oncology

## 2015-11-20 ENCOUNTER — Telehealth: Payer: Self-pay | Admitting: *Deleted

## 2015-11-20 DIAGNOSIS — D0512 Intraductal carcinoma in situ of left breast: Secondary | ICD-10-CM | POA: Diagnosis not present

## 2015-11-20 NOTE — Telephone Encounter (Signed)
I spoke with  Annette Foster  and explained the CARE (Cancer Associated Rehab and Exercise class after receiving signed MD referral from Lavena Stanford, MD.  Annette Foster  will sign an informed consent for the CARE exercise class on  and begin exercising on Tuesday and Thursdays from 12:15pm to 1:30pm (or whatever she can do). I emphasized to  Annette Foster   that she only has to attend the CARE exercise class when she feels well but hopefully this will help her need to increase her range of motion in her shoulders. Annette Foster said it hurts when she raises her arm for her cancer treatments.

## 2015-11-21 ENCOUNTER — Ambulatory Visit
Admission: RE | Admit: 2015-11-21 | Discharge: 2015-11-21 | Disposition: A | Discharge status: Home / Self Care | Payer: Medicare Other | Source: Ambulatory Visit | Attending: Radiation Oncology | Admitting: Radiation Oncology

## 2015-11-21 DIAGNOSIS — D0512 Intraductal carcinoma in situ of left breast: Secondary | ICD-10-CM | POA: Diagnosis not present

## 2015-11-22 ENCOUNTER — Ambulatory Visit
Admission: RE | Admit: 2015-11-22 | Discharge: 2015-11-22 | Disposition: A | Discharge status: Home / Self Care | Payer: Medicare Other | Source: Ambulatory Visit | Attending: Radiation Oncology | Admitting: Radiation Oncology

## 2015-11-22 DIAGNOSIS — D0512 Intraductal carcinoma in situ of left breast: Secondary | ICD-10-CM | POA: Diagnosis not present

## 2015-11-23 ENCOUNTER — Ambulatory Visit
Admission: RE | Admit: 2015-11-23 | Discharge: 2015-11-23 | Disposition: A | Discharge status: Home / Self Care | Payer: Medicare Other | Source: Ambulatory Visit | Attending: Radiation Oncology | Admitting: Radiation Oncology

## 2015-11-23 ENCOUNTER — Ambulatory Visit: Payer: Medicare Other | Attending: Internal Medicine | Admitting: Physical Therapy

## 2015-11-23 DIAGNOSIS — G8929 Other chronic pain: Secondary | ICD-10-CM

## 2015-11-23 DIAGNOSIS — M25512 Pain in left shoulder: Secondary | ICD-10-CM | POA: Insufficient documentation

## 2015-11-23 DIAGNOSIS — D0512 Intraductal carcinoma in situ of left breast: Secondary | ICD-10-CM | POA: Diagnosis not present

## 2015-11-23 NOTE — Therapy (Signed)
[place] MAIN [facility] [address] [place], [place], [address] Phone: [phone]   Fax:  [phone]  Patient Details  Name: Annette Foster MRN: [id number] Date of Birth: [birth year] Referring Provider:  Cammie Sickle, *  Encounter Date: 11/23/2015   Trotter,Margaret 11/23/2015, 1:56 PM  [place] [address] [place], [place], [address] Phone: [phone]   Fax:  [phone]   PT/OT/SLP Screening Form   Time: in:11:22     Time out: 12:00   Complaint :L shoulder pain Past Medical Hx:  Open heart surgery 45+ years ago, DM Type 2, Undergoing breast cancer treatments Injury Date:_45+ years ago  Pain Scale: 3/10 now, at its worse 9/10 after being positioned into shoulder flexion for breast cancer treatment Patient's phone number:   Hx (this occurrence):  Pt reports that she had two partial mastectomies one was in August and one was in September.  Pt reports that her L shoulder has not gotten worse since the surgery.  She reports that she has dealt with L shoulder pain for 45+ years after open heart surgery.  She reports that this pain is nothing new, she reports that she even has difficulty holding onto a coffee cup.  Pt reports she uses Biofreeze and tylenol for pain relief.  Pt reports pain gets worse doing any activity with LUE.  Pt reports she has never had any neck or cervical pain.  Pt reports she is unable to sleep on her L side due to pain.   Before breast cancer treatments where pt has to be positioned in shoulder flexion she was limiting activity she was doing with that hand.      Assessment:  Pt reports increased pain with shoulder flexion less than 90 degrees and pulling over lat muscle when performing abduction to 110 approximately.  No issue with shoulder extension. Pt able to reach behind head with both hands but increases pain, behind back does not  increase pain.  Grip strength decreased on the R compared to the L.  Drop arm test negative, [place] positive.  PROM supine flexion painful pull about 100 degress as well as passive abduction, ER and IR not painful.  Pt tender to palpation over L latissimus.  Pt has been limited in the amount of her activity by taking care of husband and undergoing breast cancer treatment.  She seems to have posterior postural muscle tightness that may have been flared up with lack of mobility.  Provided AAROM supine shoulder flexion and abduction as well as standing latissimus stretch and seated retractions.      Recommendations:  Recommended that patient try AAROM shoulder flexion and abduction, scapular retractions, lattismus stretch, if no relief noted recommended that patient call to set up physical therpy evaluation.     Comments:    [x]  Patient would benefit from an MD referral []  Patient would benefit from a full PT/OT/ SLP evaluation and treatment. []  No intervention recommended at this time.   Stacy Gardner SPT This entire session was performed under direct supervision and direction of a licensed therapist/therapist assistant . I have personally read, edited and approve of the note as written. Hillis Range, PT, DPT 11/23/15 1:57 PM

## 2015-11-24 ENCOUNTER — Ambulatory Visit: Payer: Medicare Other

## 2015-11-24 ENCOUNTER — Inpatient Hospital Stay: Payer: Medicare Other | Attending: Radiation Oncology

## 2015-11-24 ENCOUNTER — Ambulatory Visit
Admission: RE | Admit: 2015-11-24 | Discharge: 2015-11-24 | Disposition: A | Discharge status: Home / Self Care | Payer: Medicare Other | Source: Ambulatory Visit | Attending: Radiation Oncology | Admitting: Radiation Oncology

## 2015-11-24 DIAGNOSIS — J45909 Unspecified asthma, uncomplicated: Secondary | ICD-10-CM | POA: Insufficient documentation

## 2015-11-24 DIAGNOSIS — K589 Irritable bowel syndrome without diarrhea: Secondary | ICD-10-CM | POA: Insufficient documentation

## 2015-11-24 DIAGNOSIS — I509 Heart failure, unspecified: Secondary | ICD-10-CM | POA: Diagnosis not present

## 2015-11-24 DIAGNOSIS — E039 Hypothyroidism, unspecified: Secondary | ICD-10-CM | POA: Insufficient documentation

## 2015-11-24 DIAGNOSIS — Z79899 Other long term (current) drug therapy: Secondary | ICD-10-CM | POA: Diagnosis not present

## 2015-11-24 DIAGNOSIS — K219 Gastro-esophageal reflux disease without esophagitis: Secondary | ICD-10-CM | POA: Insufficient documentation

## 2015-11-24 DIAGNOSIS — E119 Type 2 diabetes mellitus without complications: Secondary | ICD-10-CM | POA: Diagnosis not present

## 2015-11-24 DIAGNOSIS — G4730 Sleep apnea, unspecified: Secondary | ICD-10-CM | POA: Insufficient documentation

## 2015-11-24 DIAGNOSIS — D0512 Intraductal carcinoma in situ of left breast: Secondary | ICD-10-CM | POA: Diagnosis not present

## 2015-11-24 DIAGNOSIS — Z7982 Long term (current) use of aspirin: Secondary | ICD-10-CM | POA: Insufficient documentation

## 2015-11-24 DIAGNOSIS — Z7984 Long term (current) use of oral hypoglycemic drugs: Secondary | ICD-10-CM | POA: Diagnosis not present

## 2015-11-24 DIAGNOSIS — Z170 Estrogen receptor positive status [ER+]: Secondary | ICD-10-CM | POA: Insufficient documentation

## 2015-11-24 LAB — CBC
HCT: 35.3 % (ref 35.0–47.0)
Hemoglobin: 12.1 g/dL (ref 12.0–16.0)
MCH: 31 pg (ref 26.0–34.0)
MCHC: 34.2 g/dL (ref 32.0–36.0)
MCV: 90.4 fL (ref 80.0–100.0)
Platelets: 119 10*3/uL — ABNORMAL LOW (ref 150–440)
RBC: 3.9 MIL/uL (ref 3.80–5.20)
RDW: 13.3 % (ref 11.5–14.5)
WBC: 3.7 10*3/uL (ref 3.6–11.0)

## 2015-11-27 ENCOUNTER — Ambulatory Visit
Admission: RE | Admit: 2015-11-27 | Discharge: 2015-11-27 | Disposition: A | Discharge status: Home / Self Care | Payer: Medicare Other | Source: Ambulatory Visit | Attending: Radiation Oncology | Admitting: Radiation Oncology

## 2015-11-27 DIAGNOSIS — D0512 Intraductal carcinoma in situ of left breast: Secondary | ICD-10-CM | POA: Diagnosis not present

## 2015-11-28 ENCOUNTER — Ambulatory Visit
Admission: RE | Admit: 2015-11-28 | Discharge: 2015-11-28 | Disposition: A | Discharge status: Home / Self Care | Payer: Medicare Other | Source: Ambulatory Visit | Attending: Radiation Oncology | Admitting: Radiation Oncology

## 2015-11-28 DIAGNOSIS — D0512 Intraductal carcinoma in situ of left breast: Secondary | ICD-10-CM | POA: Diagnosis not present

## 2015-11-29 ENCOUNTER — Ambulatory Visit
Admission: RE | Admit: 2015-11-29 | Discharge: 2015-11-29 | Disposition: A | Discharge status: Home / Self Care | Payer: Medicare Other | Source: Ambulatory Visit | Attending: Radiation Oncology | Admitting: Radiation Oncology

## 2015-11-29 DIAGNOSIS — D0512 Intraductal carcinoma in situ of left breast: Secondary | ICD-10-CM | POA: Diagnosis not present

## 2015-11-30 ENCOUNTER — Ambulatory Visit: Payer: Medicare Other

## 2015-12-01 ENCOUNTER — Ambulatory Visit
Admission: RE | Admit: 2015-12-01 | Discharge: 2015-12-01 | Disposition: A | Discharge status: Home / Self Care | Payer: Medicare Other | Source: Ambulatory Visit | Attending: Radiation Oncology | Admitting: Radiation Oncology

## 2015-12-01 DIAGNOSIS — D0512 Intraductal carcinoma in situ of left breast: Secondary | ICD-10-CM | POA: Diagnosis not present

## 2015-12-04 ENCOUNTER — Ambulatory Visit
Admission: RE | Admit: 2015-12-04 | Discharge: 2015-12-04 | Disposition: A | Discharge status: Home / Self Care | Payer: Medicare Other | Source: Ambulatory Visit | Attending: Radiation Oncology | Admitting: Radiation Oncology

## 2015-12-04 DIAGNOSIS — D0512 Intraductal carcinoma in situ of left breast: Secondary | ICD-10-CM | POA: Diagnosis not present

## 2015-12-05 ENCOUNTER — Ambulatory Visit
Admission: RE | Admit: 2015-12-05 | Discharge: 2015-12-05 | Disposition: A | Discharge status: Home / Self Care | Payer: Medicare Other | Source: Ambulatory Visit | Attending: Radiation Oncology | Admitting: Radiation Oncology

## 2015-12-05 DIAGNOSIS — D0512 Intraductal carcinoma in situ of left breast: Secondary | ICD-10-CM | POA: Diagnosis not present

## 2015-12-06 ENCOUNTER — Ambulatory Visit
Admission: RE | Admit: 2015-12-06 | Discharge: 2015-12-06 | Disposition: A | Discharge status: Home / Self Care | Payer: Medicare Other | Source: Ambulatory Visit | Attending: Radiation Oncology | Admitting: Radiation Oncology

## 2015-12-06 DIAGNOSIS — D0512 Intraductal carcinoma in situ of left breast: Secondary | ICD-10-CM | POA: Diagnosis not present

## 2015-12-07 ENCOUNTER — Ambulatory Visit
Admission: RE | Admit: 2015-12-07 | Discharge: 2015-12-07 | Disposition: A | Discharge status: Home / Self Care | Payer: Medicare Other | Source: Ambulatory Visit | Attending: Radiation Oncology | Admitting: Radiation Oncology

## 2015-12-07 DIAGNOSIS — D0512 Intraductal carcinoma in situ of left breast: Secondary | ICD-10-CM | POA: Diagnosis not present

## 2015-12-08 ENCOUNTER — Inpatient Hospital Stay: Payer: Medicare Other

## 2015-12-08 ENCOUNTER — Ambulatory Visit
Admission: RE | Admit: 2015-12-08 | Discharge: 2015-12-08 | Disposition: A | Discharge status: Home / Self Care | Payer: Medicare Other | Source: Ambulatory Visit | Attending: Radiation Oncology | Admitting: Radiation Oncology

## 2015-12-08 DIAGNOSIS — D0512 Intraductal carcinoma in situ of left breast: Secondary | ICD-10-CM | POA: Diagnosis not present

## 2015-12-08 LAB — CBC
HCT: 35 % (ref 35.0–47.0)
Hemoglobin: 11.8 g/dL — ABNORMAL LOW (ref 12.0–16.0)
MCH: 30.8 pg (ref 26.0–34.0)
MCHC: 33.9 g/dL (ref 32.0–36.0)
MCV: 90.9 fL (ref 80.0–100.0)
Platelets: 108 10*3/uL — ABNORMAL LOW (ref 150–440)
RBC: 3.85 MIL/uL (ref 3.80–5.20)
RDW: 13.7 % (ref 11.5–14.5)
WBC: 2.8 10*3/uL — ABNORMAL LOW (ref 3.6–11.0)

## 2015-12-11 ENCOUNTER — Ambulatory Visit
Admission: RE | Admit: 2015-12-11 | Discharge: 2015-12-11 | Disposition: A | Discharge status: Home / Self Care | Payer: Medicare Other | Source: Ambulatory Visit | Attending: Radiation Oncology | Admitting: Radiation Oncology

## 2015-12-11 ENCOUNTER — Ambulatory Visit: Payer: Medicare Other

## 2015-12-11 DIAGNOSIS — D0512 Intraductal carcinoma in situ of left breast: Secondary | ICD-10-CM | POA: Diagnosis not present

## 2015-12-12 ENCOUNTER — Ambulatory Visit: Payer: Medicare Other

## 2015-12-12 DIAGNOSIS — D0512 Intraductal carcinoma in situ of left breast: Secondary | ICD-10-CM | POA: Diagnosis not present

## 2015-12-13 ENCOUNTER — Ambulatory Visit
Admission: RE | Admit: 2015-12-13 | Discharge: 2015-12-13 | Disposition: A | Discharge status: Home / Self Care | Payer: Medicare Other | Source: Ambulatory Visit | Attending: Radiation Oncology | Admitting: Radiation Oncology

## 2015-12-13 ENCOUNTER — Inpatient Hospital Stay: Payer: Medicare Other | Attending: Internal Medicine | Admitting: Internal Medicine

## 2015-12-13 VITALS — BP 121/69 | HR 54 | Temp 97.4°F | Resp 18 | Wt 181.9 lb

## 2015-12-13 DIAGNOSIS — E039 Hypothyroidism, unspecified: Secondary | ICD-10-CM | POA: Insufficient documentation

## 2015-12-13 DIAGNOSIS — Z9012 Acquired absence of left breast and nipple: Secondary | ICD-10-CM | POA: Insufficient documentation

## 2015-12-13 DIAGNOSIS — I509 Heart failure, unspecified: Secondary | ICD-10-CM | POA: Insufficient documentation

## 2015-12-13 DIAGNOSIS — Z79899 Other long term (current) drug therapy: Secondary | ICD-10-CM

## 2015-12-13 DIAGNOSIS — D0512 Intraductal carcinoma in situ of left breast: Secondary | ICD-10-CM | POA: Diagnosis present

## 2015-12-13 DIAGNOSIS — Z79811 Long term (current) use of aromatase inhibitors: Secondary | ICD-10-CM

## 2015-12-13 DIAGNOSIS — G4730 Sleep apnea, unspecified: Secondary | ICD-10-CM | POA: Insufficient documentation

## 2015-12-13 DIAGNOSIS — K589 Irritable bowel syndrome without diarrhea: Secondary | ICD-10-CM | POA: Diagnosis not present

## 2015-12-13 DIAGNOSIS — E119 Type 2 diabetes mellitus without complications: Secondary | ICD-10-CM | POA: Insufficient documentation

## 2015-12-13 DIAGNOSIS — Z7982 Long term (current) use of aspirin: Secondary | ICD-10-CM | POA: Insufficient documentation

## 2015-12-13 DIAGNOSIS — Z7984 Long term (current) use of oral hypoglycemic drugs: Secondary | ICD-10-CM | POA: Insufficient documentation

## 2015-12-13 DIAGNOSIS — J45909 Unspecified asthma, uncomplicated: Secondary | ICD-10-CM | POA: Diagnosis not present

## 2015-12-13 DIAGNOSIS — K219 Gastro-esophageal reflux disease without esophagitis: Secondary | ICD-10-CM | POA: Diagnosis not present

## 2015-12-13 DIAGNOSIS — Z170 Estrogen receptor positive status [ER+]: Secondary | ICD-10-CM | POA: Insufficient documentation

## 2015-12-13 MED ORDER — ANASTROZOLE 1 MG PO TABS: 1.0000 mg | ORAL_TABLET | Freq: Every day | ORAL | 3 refills | Status: DC

## 2015-12-13 NOTE — Progress Notes (Signed)
[facility] NOTE  Patient Care Team: Albina Billet, MD as PCP - General (Internal Medicine)  CHIEF COMPLAINTS/PURPOSE OF CONSULTATION:   Oncology History   # AUG 2017- DCIS LEFT BREAST ER/PR- POS; positive margins [Dr.Smith]; s/p re-excision; s/p RT [finish RT Nov 10th 2017]; START ARIMIDEX- Jan 2018  # HRT [clinical trial thru [place]; stopped July 2017 ]; CPAP      Ductal carcinoma in situ (DCIS) of left breast   10/12/2015 Initial Diagnosis    Ductal carcinoma in situ (DCIS) of left breast       Oncology History   # AUG 2017- DCIS LEFT BREAST ER/PR- POS; positive margins [Dr.Smith]; s/p re-excision; s/p RT [finish RT Nov 10th 2017]; START ARIMIDEX- Jan 2018  # HRT [clinical trial thru [facility]; stopped July 2017 ]; CPAP      Ductal carcinoma in situ (DCIS) of left breast   10/12/2015 Initial Diagnosis    Ductal carcinoma in situ (DCIS) of left breast       HISTORY OF PRESENTING ILLNESS:  Annette Foster 75 y.o.  female pleasant patient with recently diagnosed left breast DCIS-  Currently undergoing radiation post partial mastectomy is for follow-up patient will finish radiation in approximately 1 week.  She does complain of fatigue. Otherwise no significant radiation skin changes. Patient denies any other lumps or bumps. Appetite is good. No weight loss. No headaches.  ROS: A complete 10 point review of system is done which is negative except mentioned above in history of present illness  MEDICAL HISTORY:  Past Medical History:  Diagnosis Date  . Allergy   . Asthma   . Cataract   . CHF (congestive heart failure) ([place])   . Collagenous colitis   . Diabetes mellitus without complication ([place])   . Fibrocystic breast   . GERD (gastroesophageal reflux disease)   . Heart murmur   . Hypothyroidism   . IBS (irritable bowel syndrome)   . PONV (postoperative nausea and vomiting)   . Sleep apnea     SURGICAL HISTORY: Past Surgical History:  Procedure  Laterality Date  . ABDOMINAL SURGERY    . BREAST BIOPSY Bilateral    negative  . BREAST 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/2015   stereo pending  . CARDIAC SURGERY    . CATARACT EXTRACTION Right   . CHOLECYSTECTOMY    . JOINT REPLACEMENT Left    TKR  . left sinusplasty     . MASTECTOMY, PARTIAL Left 10/17/2015   Procedure: MASTECTOMY PARTIAL REVISION;  Surgeon: Leonie Green, MD;  Location: [facility] ORS;  Service: General;  Laterality: Left;  . PARTIAL MASTECTOMY WITH NEEDLE LOCALIZATION Left 09/29/2015   Procedure: PARTIAL MASTECTOMY WITH NEEDLE LOCALIZATION;  Surgeon: Leonie Green, MD;  Location: [facility] ORS;  Service: General;  Laterality: Left;  . 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  Social History   Social History  . Marital status: Married    Spouse name: N/A  . Number of children: N/A  . Years of education: N/A   Occupational History  . Not on file.   Social History Main Topics  . Smoking status: Never Smoker  . Smokeless tobacco: Never Used  . Alcohol use No  . Drug use: No  . Sexual activity: Not on file   Other Topics Concern  . Not on file   Social History Narrative  . No narrative on file  FAMILY HISTORY: Family History  Problem Relation Age of Onset  . Breast cancer Paternal Grandmother     ALLERGIES:  is allergic to fentanyl; codeine; demerol [meperidine]; hydrocodone-acetaminophen; other; oxycodone; pentazocine; and tetracyclines & related.  MEDICATIONS:  Current Outpatient Prescriptions  Medication Sig Dispense Refill  . aspirin 81 MG tablet Take 81 mg by mouth at bedtime.    . cholecalciferol (VITAMIN D) 1000 units tablet Take 2,000 Units by mouth daily.     . Cyanocobalamin (B-12 COMPLIANCE INJECTION) 1000 MCG/ML KIT Inject 1 Syringe as directed every 30 (thirty) days.    Marland Kitchen escitalopram (LEXAPRO) 5 MG tablet Take 5 mg by mouth at bedtime.    .  fenofibrate 160 MG tablet Take 160 mg by mouth at bedtime.    . fexofenadine (ALLEGRA) 180 MG tablet Take 180 mg by mouth at bedtime.     . fluticasone (FLONASE) 50 MCG/ACT nasal spray Place 1 spray into the nose daily as needed for allergies.     . furosemide (LASIX) 20 MG tablet Take 20-40 mg by mouth every other day. Alternate between 1 a day and 2 a day    . glimepiride (AMARYL) 2 MG tablet Take 2 mg by mouth at bedtime.    Marland Kitchen Hyoscyamine Sulfate 0.375 MG TBCR Take 0.375 mg by mouth 2 (two) times daily.     . lansoprazole (PREVACID) 30 MG capsule Take 30 mg by mouth daily as needed (reflux).     Marland Kitchen levothyroxine (SYNTHROID, LEVOTHROID) 75 MCG tablet Take 75 mcg by mouth daily before breakfast.     . metFORMIN (GLUCOPHAGE-XR) 750 MG 24 hr tablet Take 750 mg by mouth 2 (two) times daily.     . montelukast (SINGULAIR) 10 MG tablet Take 10 mg by mouth at bedtime.     . saccharomyces boulardii (FLORASTOR) 250 MG capsule Take 250 mg by mouth 2 (two) times daily.     . simvastatin (ZOCOR) 20 MG tablet Take 20 mg by mouth at bedtime.    . traMADol (ULTRAM) 50 MG tablet Take 50 mg by mouth every 6 (six) hours as needed.    . valsartan (DIOVAN) 160 MG tablet Take 160 mg by mouth every morning.     Marland Kitchen anastrozole (ARIMIDEX) 1 MG tablet Take 1 tablet (1 mg total) by mouth daily. 90 tablet 3  . docusate sodium (COLACE) 100 MG capsule Take 100 mg by mouth 2 (two) times daily as needed for mild constipation.     No current facility-administered medications for this visit.       Marland Kitchen  PHYSICAL EXAMINATION: ECOG PERFORMANCE STATUS: 0 - Asymptomatic  Vitals:   12/13/15 1029  BP: 121/69  Pulse: (!) 54  Resp: 18  Temp: 97.4 F (36.3 C)   Filed Weights   12/13/15 1029  Weight: 181 lb 14.1 oz (82.5 kg)    GENERAL: Well-nourished well-developed; Alert, no distress and comfortable.  Alone.  EYES: no pallor or icterus OROPHARYNX: no thrush or ulceration; good dentition  NECK: supple, no masses  felt LYMPH:  no palpable lymphadenopathy in the cervical, axillary or inguinal regions LUNGS: clear to auscultation and  No wheeze or crackles HEART/CVS: regular rate & rhythm and no murmurs; No lower extremity edema ABDOMEN: abdomen soft, non-tender and normal bowel sounds Musculoskeletal:no cyanosis of digits and no clubbing  PSYCH: alert & oriented x 3 with fluent speech NEURO: no focal motor/sensory deficits SKIN:  no rashes or significant lesions  LABORATORY DATA:  I have reviewed the data  as listed Lab Results  Component Value Date   WBC 2.8 (L) 12/08/2015   HGB 11.8 (L) 12/08/2015   HCT 35.0 12/08/2015   MCV 90.9 12/08/2015   PLT 108 (L) 12/08/2015   No results for input(s): NA, K, CL, CO2, GLUCOSE, BUN, CREATININE, CALCIUM, GFRNONAA, GFRAA, PROT, ALBUMIN, AST, ALT, ALKPHOS, BILITOT, BILIDIR, IBILI in the last 8760 hours.  RADIOGRAPHIC STUDIES: I have personally reviewed the radiological images as listed and agreed with the findings in the report. No results found.  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 Will plan plan start arimidex in appx 1 week of jan 2018. Prescription given; will not start until seen by me.   # Discussed re: osteoporosis; will get base line BMD   # Also discussed the role of  antihormone therapy /Arimidex for primary prevention of breast cancer; discussed the potential mechanisms of action.   # Labs/ MD visit Jan 2018- 1st week.   All questions were answered. The patient knows to call the clinic with any problems, questions or concerns.     Cammie Sickle, MD 12/14/2015 7:44 AM

## 2015-12-13 NOTE — Assessment & Plan Note (Signed)
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 Will plan plan start arimidex in appx 1 week of jan 2018. Prescription given; will not start until seen by me.   # Discussed re: osteoporosis; will get base line BMD   # Also discussed the role of  antihormone therapy /Arimidex for primary prevention of breast cancer; discussed the potential mechanisms of action.   # Labs/ MD visit Jan 2018- 1st week.

## 2015-12-13 NOTE — Progress Notes (Signed)
Patient is here for follow up, she mentions diarrhea really bad about 10 stools yesterday, no fevers

## 2015-12-14 ENCOUNTER — Ambulatory Visit
Admission: RE | Admit: 2015-12-14 | Discharge: 2015-12-14 | Disposition: A | Discharge status: Home / Self Care | Payer: Medicare Other | Source: Ambulatory Visit | Attending: Radiation Oncology | Admitting: Radiation Oncology

## 2015-12-14 ENCOUNTER — Ambulatory Visit: Admitting: Podiatry

## 2015-12-14 DIAGNOSIS — D0512 Intraductal carcinoma in situ of left breast: Secondary | ICD-10-CM | POA: Diagnosis not present

## 2015-12-15 ENCOUNTER — Ambulatory Visit
Admission: RE | Admit: 2015-12-15 | Discharge: 2015-12-15 | Disposition: A | Discharge status: Home / Self Care | Payer: Medicare Other | Source: Ambulatory Visit | Attending: Radiation Oncology | Admitting: Radiation Oncology

## 2015-12-15 DIAGNOSIS — D0512 Intraductal carcinoma in situ of left breast: Secondary | ICD-10-CM | POA: Diagnosis not present

## 2015-12-18 ENCOUNTER — Encounter: Payer: Self-pay | Admitting: Unknown Physician Specialty

## 2015-12-18 ENCOUNTER — Ambulatory Visit
Admission: RE | Admit: 2015-12-18 | Discharge: 2015-12-18 | Disposition: A | Discharge status: Home / Self Care | Payer: Medicare Other | Source: Ambulatory Visit | Attending: Radiation Oncology | Admitting: Radiation Oncology

## 2015-12-18 DIAGNOSIS — D0512 Intraductal carcinoma in situ of left breast: Secondary | ICD-10-CM | POA: Diagnosis not present

## 2015-12-19 ENCOUNTER — Ambulatory Visit
Admission: RE | Admit: 2015-12-19 | Discharge: 2015-12-19 | Disposition: A | Discharge status: Home / Self Care | Payer: Medicare Other | Source: Ambulatory Visit | Attending: Radiation Oncology | Admitting: Radiation Oncology

## 2015-12-19 DIAGNOSIS — D0512 Intraductal carcinoma in situ of left breast: Secondary | ICD-10-CM | POA: Diagnosis not present

## 2015-12-20 ENCOUNTER — Ambulatory Visit
Admission: RE | Admit: 2015-12-20 | Discharge: 2015-12-20 | Disposition: A | Discharge status: Home / Self Care | Payer: Medicare Other | Source: Ambulatory Visit | Attending: Radiation Oncology | Admitting: Radiation Oncology

## 2015-12-20 ENCOUNTER — Ambulatory Visit: Payer: Medicare Other

## 2015-12-20 DIAGNOSIS — D0512 Intraductal carcinoma in situ of left breast: Secondary | ICD-10-CM | POA: Diagnosis not present

## 2015-12-21 ENCOUNTER — Ambulatory Visit
Admission: RE | Admit: 2015-12-21 | Discharge: 2015-12-21 | Disposition: A | Discharge status: Home / Self Care | Payer: Medicare Other | Source: Ambulatory Visit | Attending: Radiation Oncology | Admitting: Radiation Oncology

## 2015-12-21 ENCOUNTER — Ambulatory Visit (INDEPENDENT_AMBULATORY_CARE_PROVIDER_SITE_OTHER): Payer: Medicare Other | Admitting: Podiatry

## 2015-12-21 ENCOUNTER — Ambulatory Visit: Payer: Medicare Other

## 2015-12-21 VITALS — BP 116/51 | HR 54 | Resp 16

## 2015-12-21 DIAGNOSIS — B351 Tinea unguium: Secondary | ICD-10-CM | POA: Diagnosis not present

## 2015-12-21 DIAGNOSIS — M79676 Pain in unspecified toe(s): Secondary | ICD-10-CM | POA: Diagnosis not present

## 2015-12-21 DIAGNOSIS — D0512 Intraductal carcinoma in situ of left breast: Secondary | ICD-10-CM | POA: Diagnosis not present

## 2015-12-21 NOTE — Progress Notes (Signed)
Patient ID: Annette Foster, female   DOB: 08/08/1940, 75 y.o.   MRN: [id number]  Subjective: 75 y.o. returns the office today for painful, elongated, thickened toenails which she is unable to trim herself. Denies any redness or drainage around the nails. Denies any acute changes since last appointment and no new complaints today. Denies any systemic complaints such as fevers, chills, nausea, vomiting.   She is currently getting radiation treatment for breast cancer.   Objective: AAO 3, NAD DP/PT pulses palpable, CRT less than 3 seconds Nails hypertrophic, dystrophic, elongated, brittle, discolored 10. There is tenderness overlying the nails 1-5 bilaterally. There is no surrounding erythema or drainage along the nail sites. No open lesions or pre-ulcerative lesions are identified. No other areas of tenderness bilateral lower extremities. No overlying edema, erythema, increased warmth. No pain with calf compression, swelling, warmth, erythema.  Assessment: Patient presents with symptomatic onychomycosis  Plan: -Treatment options including alternatives, risks, complications were discussed -Nails sharply debrided 10 without complication/bleeding. -Discussed daily foot inspection. If there are any changes, to call the office immediately.  -Follow-up in 3 months or sooner if any problems are to arise. In the meantime, encouraged to call the office with any questions, concerns, changes symptoms.  Celesta Gentile, DPM

## 2015-12-22 ENCOUNTER — Ambulatory Visit
Admission: RE | Admit: 2015-12-22 | Discharge: 2015-12-22 | Disposition: A | Discharge status: Home / Self Care | Payer: Medicare Other | Source: Ambulatory Visit | Attending: Radiation Oncology | Admitting: Radiation Oncology

## 2015-12-22 DIAGNOSIS — D0512 Intraductal carcinoma in situ of left breast: Secondary | ICD-10-CM | POA: Diagnosis not present

## 2015-12-29 ENCOUNTER — Ambulatory Visit (INDEPENDENT_AMBULATORY_CARE_PROVIDER_SITE_OTHER): Payer: Medicare Other | Admitting: Podiatry

## 2015-12-29 ENCOUNTER — Ambulatory Visit (INDEPENDENT_AMBULATORY_CARE_PROVIDER_SITE_OTHER): Payer: Medicare Other

## 2015-12-29 VITALS — Temp 97.6°F

## 2015-12-29 DIAGNOSIS — R52 Pain, unspecified: Secondary | ICD-10-CM

## 2015-12-29 DIAGNOSIS — L03031 Cellulitis of right toe: Secondary | ICD-10-CM

## 2015-12-29 DIAGNOSIS — M79671 Pain in right foot: Secondary | ICD-10-CM

## 2015-12-29 DIAGNOSIS — E1143 Type 2 diabetes mellitus with diabetic autonomic (poly)neuropathy: Secondary | ICD-10-CM

## 2015-12-29 DIAGNOSIS — L603 Nail dystrophy: Secondary | ICD-10-CM | POA: Diagnosis not present

## 2015-12-29 DIAGNOSIS — M79674 Pain in right toe(s): Secondary | ICD-10-CM | POA: Diagnosis not present

## 2015-12-29 MED ORDER — SULFAMETHOXAZOLE-TRIMETHOPRIM 800-160 MG PO TABS: 1.0000 | ORAL_TABLET | Freq: Two times a day (BID) | ORAL | 0 refills | Status: DC

## 2015-12-29 NOTE — Patient Instructions (Signed)

## 2016-01-01 ENCOUNTER — Telehealth: Payer: Self-pay | Admitting: Podiatry

## 2016-01-01 NOTE — Telephone Encounter (Signed)
I spoke with pt and informed that since her procedure was 12/29/2015, she could expect redness, swelling and drainage to varying degrees for 4-6 weeks, to report increase of symptoms or cloudy drainage or streaking up the foot. I told pt she should begin to see a decrease of the symptoms the further she got from the procedure date, that if she didn't feel she was getting a drier area in the 2-3 weeks to use plain epsom salt 1/2C in 1Qt warm water and continue to apply the antibiotic ointment on a fabric bandaid. Pt states understanding.

## 2016-01-01 NOTE — Telephone Encounter (Signed)
Pt called in stating she saw Dr. Amalia Hailey Friday he removed a toenail and he toe is still red. Pt is a diabetic wanting to know what she needs to do.

## 2016-01-02 ENCOUNTER — Ambulatory Visit: Admitting: Podiatry

## 2016-01-10 ENCOUNTER — Encounter: Payer: Self-pay | Admitting: Obstetrics and Gynecology

## 2016-01-10 ENCOUNTER — Other Ambulatory Visit: Payer: Self-pay | Admitting: Gastroenterology

## 2016-01-10 ENCOUNTER — Ambulatory Visit (INDEPENDENT_AMBULATORY_CARE_PROVIDER_SITE_OTHER): Payer: Medicare Other | Admitting: Obstetrics and Gynecology

## 2016-01-10 VITALS — BP 160/67 | HR 78 | Ht 63.0 in | Wt 177.8 lb

## 2016-01-10 DIAGNOSIS — D0512 Intraductal carcinoma in situ of left breast: Secondary | ICD-10-CM

## 2016-01-10 DIAGNOSIS — N952 Postmenopausal atrophic vaginitis: Secondary | ICD-10-CM

## 2016-01-10 DIAGNOSIS — L292 Pruritus vulvae: Secondary | ICD-10-CM | POA: Diagnosis not present

## 2016-01-10 DIAGNOSIS — N904 Leukoplakia of vulva: Secondary | ICD-10-CM

## 2016-01-10 DIAGNOSIS — R1310 Dysphagia, unspecified: Secondary | ICD-10-CM

## 2016-01-10 NOTE — Patient Instructions (Signed)
1. Follow-up in 1 week for review of biopsy results and incision check   VULVAR BIOPSY POST-PROCEDURE INSTRUCTIONS  1. You may take Ibuprofen, Aleve or Tylenol for pain if needed.    2. You may have a small amount of spotting.  You should wear a mini pad for the next few days.  3. You may use some topical Neosporin ointment if you would like (over the counter is fine).  4. You need to call if you have redness around the biopsy site, if there is any unusual draining, if the bleeding is heavy, or if you are concerned.  5. Shower or bathe as normal  6. We will discuss the results at your follow-up appointment if needed.

## 2016-01-12 ENCOUNTER — Ambulatory Visit (INDEPENDENT_AMBULATORY_CARE_PROVIDER_SITE_OTHER): Payer: Medicare Other | Admitting: Podiatry

## 2016-01-12 DIAGNOSIS — N904 Leukoplakia of vulva: Secondary | ICD-10-CM | POA: Insufficient documentation

## 2016-01-12 DIAGNOSIS — M79676 Pain in unspecified toe(s): Secondary | ICD-10-CM | POA: Diagnosis not present

## 2016-01-12 DIAGNOSIS — L292 Pruritus vulvae: Secondary | ICD-10-CM | POA: Insufficient documentation

## 2016-01-12 DIAGNOSIS — S91109D Unspecified open wound of unspecified toe(s) without damage to nail, subsequent encounter: Secondary | ICD-10-CM

## 2016-01-12 DIAGNOSIS — N952 Postmenopausal atrophic vaginitis: Secondary | ICD-10-CM | POA: Insufficient documentation

## 2016-01-12 LAB — PATHOLOGY

## 2016-01-12 NOTE — Progress Notes (Signed)
History:GYN ENCOUNTER NOTE  Subjective:       Annette Foster is a 76 y.o. G38P2002 female is here for gynecologic evaluation of the following issues:  1.Vulvar itching  Patient presents for exacerbation of chronic vulvar itching. History timeline:  8-10 years ago patient had vulvar itching and burning; subsequent biopsy was inconclusive for a suspected diagnosis of lichen sclerosus.  In November 2016 the patient developed severe burning and pain inside the vagina as if things were on file. She was seen by Dr. Ammie Dalton at that time for possible flare of lichen sclerosis. She was treated with a steroid cream and Diflucan.  In December 2016 nuswab was positive for Candida.  Approximately 4 weeks ago she again had a flare of symptoms following daily XRT therapy for breast cancer. She presents now for evaluation.   Gynecologic History No LMP recorded (lmp unknown). Patient has had a hysterectomy. Status post TAH/BSO for fibroids Contraception: status post hysterectomy Last Pap: Unknown Last mammogram: 09/01/2015 BI-RADS 4  Obstetric History OB History  Gravida Para Term Preterm AB Living  2 2 2     2   SAB TAB Ectopic Multiple Live Births          2    # Outcome Date GA Lbr Len/2nd Weight Sex Delivery Anes PTL Lv  2 Term 1966    F Vag-Spont   LIV  1 Term 1963    F Vag-Spont   LIV      Past Medical History:  Diagnosis Date  . Allergy   . Asthma   . Cancer [facility]) 2017   breast cancer  . Cataract   . CHF (congestive heart failure) ([place])   . Collagenous colitis   . Diabetes mellitus without complication ([place])   . Fibrocystic breast   . GERD (gastroesophageal reflux disease)   . Heart murmur   . Hypothyroidism   . IBS (irritable bowel syndrome)   . PONV (postoperative nausea and vomiting)   . Sleep apnea     Past Surgical History:  Procedure Laterality Date  . ABDOMINAL HYSTERECTOMY     tah bso  . ABDOMINAL SURGERY    . BREAST BIOPSY Bilateral    negative  . BREAST BIOPSY  Left 09/11/2015   stereo pending  . CARDIAC SURGERY    . CATARACT EXTRACTION Right   . CHOLECYSTECTOMY    . JOINT REPLACEMENT Left    TKR  . left sinusplasty     . MASTECTOMY, PARTIAL Left 10/17/2015   Procedure: MASTECTOMY PARTIAL REVISION;  Surgeon: Leonie Green, MD;  Location: [facility] ORS;  Service: General;  Laterality: Left;  . PARTIAL MASTECTOMY WITH NEEDLE LOCALIZATION Left 09/29/2015   Procedure: PARTIAL MASTECTOMY WITH NEEDLE LOCALIZATION;  Surgeon: Leonie Green, MD;  Location: [facility] ORS;  Service: General;  Laterality: Left;  . TOTAL ABDOMINAL HYSTERECTOMY W/ BILATERAL SALPINGOOPHORECTOMY      Current Outpatient Prescriptions on File Prior to Visit  Medication Sig Dispense Refill  . anastrozole (ARIMIDEX) 1 MG tablet Take 1 tablet (1 mg total) by mouth daily. 90 tablet 3  . aspirin 81 MG tablet Take 81 mg by mouth at bedtime.    . cholecalciferol (VITAMIN D) 1000 units tablet Take 2,000 Units by mouth daily.     . Cyanocobalamin (B-12 COMPLIANCE INJECTION) 1000 MCG/ML KIT Inject 1 Syringe as directed every 30 (thirty) days.    Marland Kitchen escitalopram (LEXAPRO) 5 MG tablet Take 5 mg by mouth at bedtime.    . fenofibrate 160  MG tablet Take 160 mg by mouth at bedtime.    . fexofenadine (ALLEGRA) 180 MG tablet Take 180 mg by mouth at bedtime.     . fluticasone (FLONASE) 50 MCG/ACT nasal spray Place 1 spray into the nose daily as needed for allergies.     . furosemide (LASIX) 20 MG tablet Take 20-40 mg by mouth every other day. Alternate between 1 a day and 2 a day    . glimepiride (AMARYL) 2 MG tablet Take 2 mg by mouth at bedtime.    Marland Kitchen Hyoscyamine Sulfate 0.375 MG TBCR Take 0.375 mg by mouth 2 (two) times daily.     . lansoprazole (PREVACID) 30 MG capsule Take 30 mg by mouth daily as needed (reflux).     Marland Kitchen levothyroxine (SYNTHROID, LEVOTHROID) 75 MCG tablet Take 75 mcg by mouth daily before breakfast.     . metFORMIN (GLUCOPHAGE-XR) 750 MG 24 hr tablet Take 750 mg by mouth 2 (two)  times daily.     . montelukast (SINGULAIR) 10 MG tablet Take 10 mg by mouth at bedtime.     . saccharomyces boulardii (FLORASTOR) 250 MG capsule Take 250 mg by mouth 2 (two) times daily.     . simvastatin (ZOCOR) 20 MG tablet Take 20 mg by mouth at bedtime.    . traMADol (ULTRAM) 50 MG tablet Take 50 mg by mouth every 6 (six) hours as needed.    . valsartan (DIOVAN) 160 MG tablet Take 160 mg by mouth every morning.      No current facility-administered medications on file prior to visit.     Allergies  Allergen Reactions  . Fentanyl Shortness Of Breath, Itching and Nausea And Vomiting  . Codeine Itching and Nausea And Vomiting  . Demerol [Meperidine] Itching and Nausea And Vomiting  . Hydrocodone-Acetaminophen Itching and Nausea And Vomiting  . Other Other (See Comments)  . Oxycodone Itching and Nausea And Vomiting  . Pentazocine Other (See Comments)  . Tetracyclines & Related Rash    Social History   Social History  . Marital status: Married    Spouse name: N/A  . Number of children: N/A  . Years of education: N/A   Occupational History  . Not on file.   Social History Main Topics  . Smoking status: Never Smoker  . Smokeless tobacco: Never Used  . Alcohol use No  . Drug use: No  . Sexual activity: Not Currently    Birth control/ protection: Surgical   Other Topics Concern  . Not on file   Social History Narrative  . No narrative on file    Family History  Problem Relation Age of Onset  . Breast cancer Paternal Grandmother   . Colon cancer Father   . Diabetes Sister   . Diabetes Brother   . Heart disease Brother   . Colon cancer Maternal Uncle   . Ovarian cancer Neg Hx     The following portions of the patient's history were reviewed and updated as appropriate: allergies, current medications, past family history, past medical history, past social history, past surgical history and problem list.  Review of Systems Review of Systems -Per history of present  illness  Objective:   BP (!) 160/67   Pulse 78   Ht 5' 3"  (1.6 m)   Wt 177 lb 12.8 oz (80.6 kg)   LMP  (LMP Unknown)   BMI 31.50 kg/m  CONSTITUTIONAL: Well-developed, well-nourished female in no acute distress.  HENT:  Normocephalic, atraumatic.  NECK:Not  examined SKIN: Skin is warm and dry. No rash noted. Not diaphoretic. No erythema. No pallor. [place]: Alert and oriented to person, place, and time. PSYCHIATRIC: Normal mood and affect. Normal behavior. Normal judgment and thought content. CARDIOVASCULAR:Not Examined RESPIRATORY: Not Examined BREASTS: Not Examined ABDOMEN: Soft, non distended; Non tender.  No Organomegaly. PELVIC:  External Genitalia: Minimal hyperemia, symmetric; at the clitoral hood there was a several millimeter epithelial skin breakdown in a linear configuration; no ulceration; no leukoplakia  BUS: Normal  Vagina: Moderate to severe atrophy  Cervix: Surgically absent  Uterus: Surgically absent  Adnexa: Normal; no palpable masses or tenderness  RV: Normal external exam  Bladder: Nontender MUSCULOSKELETAL: Normal range of motion. No tenderness.  No cyanosis, clubbing, or edema.  PROCEDURE: Vulvar biopsy Verbal consent is obtained. The perineum is prepped with Betadine solution. 3 cc of 1% lidocaine without epinephrine is injected just to the left of the clitoral hood for biopsy anesthesia. 3.5 mm punch biopsy is taken. A single suture of 3-0 Vicryl repeat as used for biopsy closure. Blood loss was minimal. Complications were none. Procedure was well-tolerated.     Assessment:   1. Vulvar itching - Pathology  2. Vulvar dystrophy, History of  3. Vaginal atrophy  4. History of breast cancer     Plan:   1. Vulvar biopsy 2. Return in 1 week for follow-up and further management planning  Brayton Mars, MD  Note: This dictation was prepared with Dragon dictation along with smaller phrase technology. Any transcriptional errors that result  from this process are unintentional.

## 2016-01-14 NOTE — Progress Notes (Signed)
Subjective: Patient presents today 2 weeks post total nail avulsion procedure. Patient states that the toe and nail fold is feeling much better.  Objective: Skin is warm, dry and supple. Nail bed and respective nail fold appears to be healing appropriately. Open wound to the associated nail fold with a granular wound base and moderate amount of fibrotic tissue. Minimal drainage noted. Mild erythema around the periungual region likely due to phenol chemical matricectomy.  Assessment: #1 postop permanent total nail avulsion #2 open wound periungual nail fold and nail bed of respective digit.   Plan of care: #1 patient was evaluated  #2 debridement of open wound was performed to the periungual border and nail fold of the respective toe using a currette. Antibiotic ointment and Band-Aid was applied. #3 patient is to return to clinic on a PRN  basis.

## 2016-01-16 ENCOUNTER — Ambulatory Visit (INDEPENDENT_AMBULATORY_CARE_PROVIDER_SITE_OTHER): Payer: Medicare Other | Admitting: Obstetrics and Gynecology

## 2016-01-16 ENCOUNTER — Encounter: Payer: Self-pay | Admitting: *Deleted

## 2016-01-16 ENCOUNTER — Encounter: Payer: Self-pay | Admitting: Obstetrics and Gynecology

## 2016-01-16 VITALS — BP 153/70 | HR 61 | Ht 63.0 in | Wt 179.0 lb

## 2016-01-16 DIAGNOSIS — L292 Pruritus vulvae: Secondary | ICD-10-CM | POA: Diagnosis not present

## 2016-01-16 DIAGNOSIS — N763 Subacute and chronic vulvitis: Secondary | ICD-10-CM

## 2016-01-16 MED ORDER — CLOBETASOL PROPIONATE 0.05 % EX OINT: 1.0000 "application " | TOPICAL_OINTMENT | Freq: Two times a day (BID) | CUTANEOUS | 0 refills | Status: DC

## 2016-01-16 NOTE — Patient Instructions (Signed)
1. Apply Temovate ointment 0.05% twice a day for 2 weeks; then once a day for 4 weeks 2. Return in 6 weeks for follow-up

## 2016-01-16 NOTE — Progress Notes (Signed)
Chief complaint: 1. Chronic colitis 2. Follow-up on vulvar biopsy  Patient had periclitoral biopsy performed one week ago. She had no problems following the biopsy.  Chronic vulvar itching persists.  Pathology: Diagnosis:  VULVA, BIOPSY:  BENIGN VULVAR TISSUE WITH CHRONIC INFLAMMATION.  XDB/01/12/2016   OBJECTIVE: BP (!) 153/70   Pulse 61   Ht 5' 3"  (1.6 m)   Wt 179 lb (81.2 kg)   LMP  (LMP Unknown)   BMI 31.71 kg/m  Physical exam: Pelvic: External genitalia-periclitoral biopsy site is well approximated without evidence of inflammation or induration; hyperemia, symmetric, involving labia minora, labia majora, and perianal region  ASSESSMENT: 1. History of vulvar dystrophy, unclear diagnosis 2. One-week status post vulvar biopsy-benign chronic inflammation  PLAN: 1. Temovate ointment 0.05% topically twice a day for 2 weeks; then daily for 4 weeks 2. Return in 6 weeks for follow-up  A total of 15 minutes were spent face-to-face with the patient during this encounter and over half of that time dealt with counseling and coordination of care.   Brayton Mars, MD  Note: This dictation was prepared with Dragon dictation along with smaller phrase technology. Any transcriptional errors that result from this process are unintentional.  .

## 2016-01-16 NOTE — Addendum Note (Signed)
Addended by: Elouise Munroe on: 01/16/2016 09:09 AM   Modules accepted: Orders

## 2016-01-16 NOTE — Progress Notes (Signed)
Thinking of yo card mailed to the patient.

## 2016-01-17 ENCOUNTER — Ambulatory Visit
Admission: RE | Admit: 2016-01-17 | Discharge: 2016-01-17 | Disposition: A | Discharge status: Home / Self Care | Payer: Medicare Other | Source: Ambulatory Visit | Attending: Gastroenterology | Admitting: Gastroenterology

## 2016-01-17 ENCOUNTER — Encounter: Payer: Self-pay | Admitting: Obstetrics and Gynecology

## 2016-01-17 DIAGNOSIS — R05 Cough: Secondary | ICD-10-CM | POA: Diagnosis not present

## 2016-01-17 DIAGNOSIS — K219 Gastro-esophageal reflux disease without esophagitis: Secondary | ICD-10-CM | POA: Insufficient documentation

## 2016-01-17 DIAGNOSIS — R1310 Dysphagia, unspecified: Secondary | ICD-10-CM | POA: Diagnosis present

## 2016-01-17 DIAGNOSIS — K228 Other specified diseases of esophagus: Secondary | ICD-10-CM | POA: Diagnosis not present

## 2016-01-17 DIAGNOSIS — K449 Diaphragmatic hernia without obstruction or gangrene: Secondary | ICD-10-CM | POA: Diagnosis not present

## 2016-01-21 NOTE — Progress Notes (Signed)
Subjective:  Patient with a history of diabetes mellitus presents today for new complaint of pain and tenderness to the right great toe. Patient states the pain started last night. Patient complains of a toenail that is painful and red around the borders. Patient presents today for follow-up treatment and evaluation    Objective/Physical Exam General: The patient is alert and oriented x3 in no acute distress.  Dermatology: Hyperkeratotic, onychodystrophy, discolored nail noted to the right great toenail. Periungual borders are inflamed with possible cellulitic reaction. Negative for purulent drainage. Significant pain on palpation and direct compression of the right great toenail.  Skin is warm, dry and supple bilateral lower extremities. Negative for open lesions or macerations.  Vascular: Palpable pedal pulses bilaterally. No edema or erythema noted. Capillary refill within normal limits.  Neurological: Epicritic and protective threshold diminished bilaterally.   Musculoskeletal Exam: Range of motion within normal limits to all pedal and ankle joints bilateral. Muscle strength 5/5 in all groups bilateral.    Assessment: #1  cellulitis right great toe #2 diabetes mellitus with peripheral neuropathy #3 pain in right great toenail   Plan of Care:  #1 Patient was evaluated. #2 today we discussed the conservative versus surgical management of total nail avulsion to the right great toenail. Surgical management would consist of total nail avulsion right great toe with application of phenol. Patient opts for surgical management due to the fact that she has recurrent issues with the right great toenail. #3 total permanent toenail avulsion is performed to the right great toenail. Prior to procedure, local anesthesia infiltration was utilized using a 50-50 mixture of 2% lidocaine plain was 0.5% Marcaine plain in a hallux block fashion. Betadine prep was utilized for aseptic preparation. Right great  toenail was totally avulsed followed by 388 seconds applications of phenol and alcohol flush. Light dressing applied. #4 prescription for Bactrim 10 days #5 return to clinic in 2 weeks   Edrick Kins, DPM [facility]  Dr. Edrick Kins, [address]                                        [place], [place] [address]                Office [phone]  Fax [phone]

## 2016-01-22 ENCOUNTER — Ambulatory Visit
Admission: RE | Admit: 2016-01-22 | Discharge: 2016-01-22 | Disposition: A | Discharge status: Home / Self Care | Payer: Medicare Other | Source: Ambulatory Visit | Attending: Radiation Oncology | Admitting: Radiation Oncology

## 2016-01-22 ENCOUNTER — Encounter: Payer: Self-pay | Admitting: Radiation Oncology

## 2016-01-22 VITALS — BP 123/69 | HR 61 | Temp 96.9°F | Resp 18 | Wt 179.2 lb

## 2016-01-22 DIAGNOSIS — Z923 Personal history of irradiation: Secondary | ICD-10-CM | POA: Diagnosis not present

## 2016-01-22 DIAGNOSIS — D0512 Intraductal carcinoma in situ of left breast: Secondary | ICD-10-CM | POA: Diagnosis present

## 2016-01-22 DIAGNOSIS — Z170 Estrogen receptor positive status [ER+]: Secondary | ICD-10-CM | POA: Diagnosis not present

## 2016-01-22 NOTE — Progress Notes (Signed)
Radiation Oncology Follow up Note  Name: Annette Foster   Date:   01/22/2016 MRN:  [id number] DOB: 04-20-1940    This 75 y.o. 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 She was status post wide local excision. She is seen today in routine follow-up and is doing well. She specifically denies breast tenderness cough or bone pain. She's been having some irritable bowel type symptoms other: And some skin changes unrelated to her prior radiation. She has not yet started antiestrogen therapy she was ER/PR positive and is scheduled to meet with the medical oncologists in the next several weeks to start that prescription..  COMPLICATIONS OF TREATMENT: none  FOLLOW UP COMPLIANCE: keeps appointments   PHYSICAL EXAM:  BP 123/69   Pulse 61   Temp (!) 96.9 F (36.1 C)   Resp 18   Wt 179 lb 3.7 oz (81.3 kg)   LMP  (LMP Unknown)   BMI 31.75 kg/m  Lungs are clear to A&P cardiac examination essentially unremarkable with regular rate and rhythm. No dominant mass or nodularity is noted in either breast in 2 positions examined. Incision is well-healed. No axillary or supraclavicular adenopathy is appreciated. Cosmetic result is excellent. Well-developed well-nourished patient in NAD. HEENT reveals PERLA, EOMI, discs not visualized.  Oral cavity is clear. No oral mucosal lesions are identified. Neck is clear without evidence of cervical or supraclavicular adenopathy. Lungs are clear to A&P. Cardiac examination is essentially unremarkable with regular rate and rhythm without murmur rub or thrill. Abdomen is benign with no organomegaly or masses noted. Motor sensory and DTR levels are equal and symmetric in the upper and lower extremities.  Cranial nerves II through XII are grossly intact. Proprioception is intact. No peripheral adenopathy or edema is identified. No motor or sensory levels are noted. Crude visual fields are within normal range.  RADIOLOGY RESULTS: No current films for review  PLAN: At the present time she is doing well 1 month out from whole breast radiation I'm please were overall progress. She will be seeing medical oncology to start antiestrogen therapy. I have asked to see her back in 4-5 months for follow-up. Patient knows to call sooner with any concerns.  I would like to take this opportunity to thank you for allowing me to participate in the care of your patient.Armstead Peaks., MD

## 2016-02-07 ENCOUNTER — Ambulatory Visit
Admission: RE | Admit: 2016-02-07 | Discharge: 2016-02-07 | Disposition: A | Discharge status: Home / Self Care | Payer: Medicare Other | Source: Ambulatory Visit | Attending: Internal Medicine | Admitting: Internal Medicine

## 2016-02-07 ENCOUNTER — Other Ambulatory Visit: Payer: Self-pay | Admitting: Internal Medicine

## 2016-02-07 DIAGNOSIS — R05 Cough: Secondary | ICD-10-CM

## 2016-02-07 DIAGNOSIS — R918 Other nonspecific abnormal finding of lung field: Secondary | ICD-10-CM | POA: Diagnosis not present

## 2016-02-07 DIAGNOSIS — R059 Cough, unspecified: Secondary | ICD-10-CM

## 2016-02-08 ENCOUNTER — Ambulatory Visit
Admission: RE | Admit: 2016-02-08 | Discharge: 2016-02-08 | Disposition: A | Discharge status: Home / Self Care | Payer: Medicare Other | Source: Ambulatory Visit | Attending: Internal Medicine | Admitting: Internal Medicine

## 2016-02-08 DIAGNOSIS — D0512 Intraductal carcinoma in situ of left breast: Secondary | ICD-10-CM | POA: Insufficient documentation

## 2016-02-08 DIAGNOSIS — E119 Type 2 diabetes mellitus without complications: Secondary | ICD-10-CM | POA: Diagnosis not present

## 2016-02-08 DIAGNOSIS — Z79899 Other long term (current) drug therapy: Secondary | ICD-10-CM | POA: Insufficient documentation

## 2016-02-08 DIAGNOSIS — Z780 Asymptomatic menopausal state: Secondary | ICD-10-CM | POA: Diagnosis not present

## 2016-02-09 ENCOUNTER — Other Ambulatory Visit
Admission: RE | Admit: 2016-02-09 | Discharge: 2016-02-09 | Disposition: A | Discharge status: Home / Self Care | Payer: Medicare Other | Source: Ambulatory Visit | Attending: Nurse Practitioner | Admitting: Nurse Practitioner

## 2016-02-09 ENCOUNTER — Other Ambulatory Visit: Payer: Self-pay | Admitting: *Deleted

## 2016-02-09 DIAGNOSIS — D0512 Intraductal carcinoma in situ of left breast: Secondary | ICD-10-CM

## 2016-02-09 DIAGNOSIS — R197 Diarrhea, unspecified: Secondary | ICD-10-CM | POA: Diagnosis present

## 2016-02-09 LAB — GASTROINTESTINAL PANEL BY PCR, STOOL (REPLACES STOOL CULTURE)

## 2016-02-09 LAB — C DIFFICILE QUICK SCREEN W PCR REFLEX
C Diff antigen: NEGATIVE
C Diff interpretation: NOT DETECTED
C Diff toxin: NEGATIVE

## 2016-02-13 ENCOUNTER — Other Ambulatory Visit: Payer: Self-pay | Admitting: Nurse Practitioner

## 2016-02-13 DIAGNOSIS — R1084 Generalized abdominal pain: Secondary | ICD-10-CM

## 2016-02-13 DIAGNOSIS — R197 Diarrhea, unspecified: Secondary | ICD-10-CM

## 2016-02-15 ENCOUNTER — Encounter: Payer: Self-pay | Admitting: Internal Medicine

## 2016-02-15 ENCOUNTER — Ambulatory Visit: Admission: RE | Admit: 2016-02-15 | Payer: Medicare Other | Source: Ambulatory Visit

## 2016-02-15 ENCOUNTER — Inpatient Hospital Stay: Payer: Medicare Other

## 2016-02-15 ENCOUNTER — Inpatient Hospital Stay: Payer: Medicare Other | Attending: Internal Medicine | Admitting: Internal Medicine

## 2016-02-15 VITALS — BP 116/63 | HR 60 | Temp 97.5°F | Wt 176.0 lb

## 2016-02-15 DIAGNOSIS — E039 Hypothyroidism, unspecified: Secondary | ICD-10-CM | POA: Diagnosis not present

## 2016-02-15 DIAGNOSIS — J45909 Unspecified asthma, uncomplicated: Secondary | ICD-10-CM | POA: Diagnosis not present

## 2016-02-15 DIAGNOSIS — K219 Gastro-esophageal reflux disease without esophagitis: Secondary | ICD-10-CM | POA: Insufficient documentation

## 2016-02-15 DIAGNOSIS — I509 Heart failure, unspecified: Secondary | ICD-10-CM | POA: Diagnosis not present

## 2016-02-15 DIAGNOSIS — K529 Noninfective gastroenteritis and colitis, unspecified: Secondary | ICD-10-CM | POA: Diagnosis not present

## 2016-02-15 DIAGNOSIS — K589 Irritable bowel syndrome without diarrhea: Secondary | ICD-10-CM | POA: Diagnosis not present

## 2016-02-15 DIAGNOSIS — D0512 Intraductal carcinoma in situ of left breast: Secondary | ICD-10-CM | POA: Diagnosis not present

## 2016-02-15 DIAGNOSIS — Z79810 Long term (current) use of selective estrogen receptor modulators (SERMs): Secondary | ICD-10-CM | POA: Diagnosis not present

## 2016-02-15 DIAGNOSIS — Z7982 Long term (current) use of aspirin: Secondary | ICD-10-CM | POA: Diagnosis not present

## 2016-02-15 DIAGNOSIS — Z923 Personal history of irradiation: Secondary | ICD-10-CM | POA: Diagnosis not present

## 2016-02-15 DIAGNOSIS — G4730 Sleep apnea, unspecified: Secondary | ICD-10-CM | POA: Diagnosis not present

## 2016-02-15 DIAGNOSIS — Z170 Estrogen receptor positive status [ER+]: Secondary | ICD-10-CM

## 2016-02-15 DIAGNOSIS — E119 Type 2 diabetes mellitus without complications: Secondary | ICD-10-CM | POA: Insufficient documentation

## 2016-02-15 DIAGNOSIS — Z7984 Long term (current) use of oral hypoglycemic drugs: Secondary | ICD-10-CM | POA: Diagnosis not present

## 2016-02-15 DIAGNOSIS — D696 Thrombocytopenia, unspecified: Secondary | ICD-10-CM

## 2016-02-15 LAB — CBC WITH DIFFERENTIAL/PLATELET
Basophils Absolute: 0 10*3/uL (ref 0–0.1)
Basophils Relative: 1 %
Eosinophils Absolute: 0.1 10*3/uL (ref 0–0.7)
Eosinophils Relative: 3 %
HCT: 36.6 % (ref 35.0–47.0)
Hemoglobin: 12.3 g/dL (ref 12.0–16.0)
Lymphocytes Relative: 41 %
Lymphs Abs: 1.6 10*3/uL (ref 1.0–3.6)
MCH: 30 pg (ref 26.0–34.0)
MCHC: 33.6 g/dL (ref 32.0–36.0)
MCV: 89.2 fL (ref 80.0–100.0)
Monocytes Absolute: 0.3 10*3/uL (ref 0.2–0.9)
Monocytes Relative: 9 %
Neutro Abs: 1.9 10*3/uL (ref 1.4–6.5)
Neutrophils Relative %: 46 %
Platelets: 127 10*3/uL — ABNORMAL LOW (ref 150–440)
RBC: 4.1 MIL/uL (ref 3.80–5.20)
RDW: 14.1 % (ref 11.5–14.5)
WBC: 4 10*3/uL (ref 3.6–11.0)

## 2016-02-15 LAB — COMPREHENSIVE METABOLIC PANEL
ALT: 45 U/L (ref 14–54)
AST: 71 U/L — ABNORMAL HIGH (ref 15–41)
Albumin: 3.7 g/dL (ref 3.5–5.0)
Alkaline Phosphatase: 54 U/L (ref 38–126)
Anion gap: 7 (ref 5–15)
BUN: 13 mg/dL (ref 6–20)
CO2: 29 mmol/L (ref 22–32)
Calcium: 8.7 mg/dL — ABNORMAL LOW (ref 8.9–10.3)
Chloride: 102 mmol/L (ref 101–111)
Creatinine, Ser: 0.78 mg/dL (ref 0.44–1.00)
GFR calc Af Amer: >60 mL/min (ref >60)
GFR calc non Af Amer: >60 mL/min (ref >60)
Glucose, Bld: 137 mg/dL — ABNORMAL HIGH (ref 65–99)
Potassium: 3.8 mmol/L (ref 3.5–5.1)
Sodium: 138 mmol/L (ref 135–145)
Total Bilirubin: 0.5 mg/dL (ref 0.3–1.2)
Total Protein: 6.9 g/dL (ref 6.5–8.1)

## 2016-02-15 NOTE — Progress Notes (Signed)
[place] NOTE  Patient Care Team: Albina Billet, MD as PCP - General (Internal Medicine)  CHIEF COMPLAINTS/PURPOSE OF CONSULTATION:   Oncology History   # AUG 2017- DCIS LEFT BREAST ER/PR- POS; positive margins [Dr.Smith]; s/p re-excision; s/p RT [finish RT Nov 10th 2017]; START ARIMIDEX- Jan 2018  # HRT [clinical trial thru [place]; stopped July 2017 ]; CPAP      Ductal carcinoma in situ (DCIS) of left breast   10/12/2015 Initial Diagnosis    Ductal carcinoma in situ (DCIS) of left breast       Oncology History   # AUG 2017- DCIS LEFT BREAST ER/PR- POS; positive margins [Dr.Smith]; s/p re-excision; s/p RT [finish RT Nov 10th 2017]; START ARIMIDEX- Jan 2018  # HRT [clinical trial thru [place]; stopped July 2017 ]; CPAP      Ductal carcinoma in situ (DCIS) of left breast   10/12/2015 Initial Diagnosis    Ductal carcinoma in situ (DCIS) of left breast       HISTORY OF PRESENTING ILLNESS:  Annette Foster 76 y.o.  female pleasant patient with diagnosed left breast DCIS in August 2017-  Finished radiation post partial mastectomy on November 10th.  She was recently treated with several different antibiotics for a toe infection, bronchitis and pneumonia. She then developed Colitis. She is seeing her PCP for this. She has been having 5-10 loose bowel movements each day for the past month. She was scheduled for repeat CT scan of abdomen but due to the weather, it was canceled. She is beginning to feel better and is questioning whether to start her Arimidex today due to all of her infections she has had recently.  She also complains of fatigue but she thinks it is due to all of her recent illnesses. She is beginning to feel better. She has not had any significant skin radiation skin changes. Patient denies any other lumps or bumps. Appetite is good. No weight loss. No headaches.  ROS: A complete 10 point review of system is done which is negative except mentioned above in  history of present illness  MEDICAL HISTORY:  Past Medical History:  Diagnosis Date  . Allergy   . Asthma   . Cancer [facility]) 2017   breast cancer  . Cataract   . CHF (congestive heart failure) ([place])   . Collagenous colitis   . Diabetes mellitus without complication ([place])   . Fibrocystic breast   . GERD (gastroesophageal reflux disease)   . Heart murmur   . Hypothyroidism   . IBS (irritable bowel syndrome)   . PONV (postoperative nausea and vomiting)   . Sleep apnea     SURGICAL HISTORY: Past Surgical History:  Procedure Laterality Date  . ABDOMINAL HYSTERECTOMY     [place] bso  . ABDOMINAL SURGERY    . BREAST BIOPSY Bilateral    negative  . BREAST 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/2015   stereo pending  . CARDIAC SURGERY    . CATARACT EXTRACTION Right   . CHOLECYSTECTOMY    . JOINT REPLACEMENT Left    TKR  . left sinusplasty     . MASTECTOMY, PARTIAL Left 10/17/2015   Procedure: MASTECTOMY PARTIAL REVISION;  Surgeon: Leonie Green, MD;  Location: [facility] ORS;  Service: General;  Laterality: Left;  . PARTIAL MASTECTOMY WITH NEEDLE LOCALIZATION Left 09/29/2015   Procedure: PARTIAL MASTECTOMY WITH NEEDLE LOCALIZATION;  Surgeon: Leonie Green, MD;  Location: [facility] ORS;  Service: General;  Laterality: Left;  . 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  Social History   Social History  . Marital status: Married    Spouse name: N/A  . Number of children: N/A  . Years of education: N/A   Occupational History  . Not on file.   Social History Main Topics  . Smoking status: Never Smoker  . Smokeless tobacco: Never Used  . Alcohol use No  . Drug use: No  . Sexual activity: Not Currently    Birth control/ protection: Surgical   Other Topics Concern  . Not on file   Social History Narrative  . No narrative on file    FAMILY HISTORY: Family History  Problem Relation Age of Onset   . Breast cancer Paternal Grandmother   . Colon cancer Father   . Diabetes Sister   . Diabetes Brother   . Heart disease Brother   . Colon cancer Maternal Uncle   . Ovarian cancer Neg Hx     ALLERGIES:  is allergic to fentanyl; codeine; demerol [meperidine]; hydrocodone-acetaminophen; other; oxycodone; pentazocine; and tetracyclines & related.  MEDICATIONS:  Current Outpatient Prescriptions  Medication Sig Dispense Refill  . aspirin 81 MG tablet Take 81 mg by mouth at bedtime.    . budesonide (ENTOCORT EC) 3 MG 24 hr capsule Take 9 mg qd for a month,  6 mg daily for a month, and then 6 mg every other day to follow.    . cholecalciferol (VITAMIN D) 1000 units tablet Take 2,000 Units by mouth daily.     . clobetasol ointment (TEMOVATE) 3.82 % Apply 1 application topically 2 (two) times daily. 30 g 0  . colestipol (COLESTID) 1 g tablet Take 2 g by mouth daily.     . Cyanocobalamin (B-12 COMPLIANCE INJECTION) 1000 MCG/ML KIT Inject 1 Syringe as directed every 30 (thirty) days.    Marland Kitchen escitalopram (LEXAPRO) 5 MG tablet Take 5 mg by mouth at bedtime.    Marland Kitchen esomeprazole (NEXIUM) 40 MG capsule Take by mouth.    . fenofibrate 160 MG tablet Take 160 mg by mouth at bedtime.    . fexofenadine (ALLEGRA) 180 MG tablet Take 180 mg by mouth at bedtime.     . fluticasone (FLONASE) 50 MCG/ACT nasal spray Place 1 spray into the nose daily as needed for allergies.     . furosemide (LASIX) 20 MG tablet Take 20-40 mg by mouth every other day. Alternate between 1 a day and 2 a day    . glimepiride (AMARYL) 2 MG tablet Take 2 mg by mouth at bedtime.    Marland Kitchen Hyoscyamine Sulfate 0.375 MG TBCR Take 0.375 mg by mouth 2 (two) times daily.     . lansoprazole (PREVACID) 30 MG capsule Take 30 mg by mouth daily as needed (reflux).     Marland Kitchen levothyroxine (SYNTHROID, LEVOTHROID) 75 MCG tablet Take 75 mcg by mouth daily before breakfast.     . metFORMIN (GLUCOPHAGE-XR) 750 MG 24 hr tablet Take 750 mg by mouth 2 (two) times daily.      . montelukast (SINGULAIR) 10 MG tablet Take 10 mg by mouth at bedtime.     Marland Kitchen nystatin cream (MYCOSTATIN) Apply topically.    . saccharomyces boulardii (FLORASTOR) 250 MG capsule Take 250 mg by mouth 2 (two) times daily.     . simvastatin (ZOCOR) 20 MG tablet Take 20 mg by mouth at bedtime.    . traMADol (ULTRAM) 50 MG  tablet Take 50 mg by mouth every 6 (six) hours as needed.    . valsartan (DIOVAN) 160 MG tablet Take 160 mg by mouth every morning.     Marland Kitchen anastrozole (ARIMIDEX) 1 MG tablet Take 1 tablet (1 mg total) by mouth daily. (Patient not taking: Reported on 02/15/2016) 90 tablet 3   No current facility-administered medications for this visit.       Marland Kitchen  PHYSICAL EXAMINATION: ECOG PERFORMANCE STATUS: 0 - Asymptomatic  Vitals:   02/15/16 1031  BP: 116/63  Pulse: 60  Temp: 97.5 F (36.4 C)   Filed Weights   02/15/16 1031  Weight: 176 lb (79.8 kg)    GENERAL: Well-nourished well-developed; Alert, no distress and comfortable.  Alone.  EYES: no pallor or icterus OROPHARYNX: no thrush or ulceration; good dentition  NECK: supple, no masses felt LYMPH:  no palpable lymphadenopathy in the cervical, axillary or inguinal regions LUNGS: clear to auscultation and  No wheeze or crackles HEART/CVS: regular rate & rhythm and no murmurs; No lower extremity edema ABDOMEN: abdomen soft, tender in LLQ with normal bowel sounds Musculoskeletal:no cyanosis of digits and no clubbing  PSYCH: alert & oriented x 3 with fluent speech NEURO: no focal motor/sensory deficits SKIN:  no rashes or significant lesions  LABORATORY DATA:  I have reviewed the data as listed Lab Results  Component Value Date   WBC 4.0 02/15/2016   HGB 12.3 02/15/2016   HCT 36.6 02/15/2016   MCV 89.2 02/15/2016   PLT 127 (L) 02/15/2016    Recent Labs  02/15/16 0956  NA 138  K 3.8  CL 102  CO2 29  GLUCOSE 137*  BUN 13  CREATININE 0.78  CALCIUM 8.7*  GFRNONAA >60  GFRAA >60  PROT 6.9  ALBUMIN 3.7  AST  71*  ALT 45  ALKPHOS 54  BILITOT 0.5    RADIOGRAPHIC STUDIES: I have personally reviewed the radiological images as listed and agreed with the findings in the report. Dg Chest 2 View  Result Date: 02/07/2016 CLINICAL DATA:  Productive cough for 10 days. EXAM: CHEST  2 VIEW COMPARISON:  None. FINDINGS: Mild patchy opacity in the left apex is age indeterminate. The right lung is clear. Mildly nodular density in the left upper lobe. The heart size borderline. The hila are symmetric. The mediastinum is normal. IMPRESSION: 1. Mild infiltrate in the left apex is nonspecific but could be the source of the patient's cough. Recommend short-term follow-up to ensure resolution. 2. Two small nodules in the left lung could be part of the infectious process. Recommend attention on follow-up. If they persist on follow-up, recommend CT scan. Electronically Signed   By: Dorise Bullion III M.D   On: 02/07/2016 09:50   Dg Esophagus  Result Date: 01/17/2016 CLINICAL DATA:  Choking sensation when taking large pills. History of breast malignancy status post mastectomy and recent radiation. History of CHF. EXAM: ESOPHOGRAM / BARIUM SWALLOW / BARIUM TABLET STUDY TECHNIQUE: Combined double contrast and single contrast examination performed using effervescent crystals, thick barium liquid, and thin barium liquid. The patient was observed with fluoroscopy swallowing a 13 mm barium sulphate tablet. FLUOROSCOPY TIME:  Fluoroscopy Time:  1 minutes, 24 seconds Radiation Exposure Index (if provided by the fluoroscopic device): 1221 micro Gy per meter squared Number of Acquired Spot Images: 8+ 1 video loop COMPARISON:  None in PACs FINDINGS: The cervical esophagus distended well. There is transient laryngeal penetration of the barium which elicited a cough reflex and did not recur.  A mildly prominent cricopharyngeus muscle impression was observed transiently. The thoracic esophagus distended well. There was no fixed stricture. There  was a small reducible hiatal hernia. A small amount of gastroesophageal reflux was transiently observed. The barium tablet passed promptly from the mouth to the stomach. IMPRESSION: No significant esophageal stricture is observed. 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 There is a small reducible hiatal hernia with small amount of gastroesophageal reflux. One episode of laryngeal penetration of the barium which prompted a cough reflex. Given that the patient's symptoms occur only with her colestipol tablets, options such as breaking more crushing the tablets or obtaining a liquid formulation if any could be considered. Electronically Signed   By: David  Martinique M.D.   On: 01/17/2016 11:39   Dg Bone Density  Result Date: 02/08/2016 EXAM: DUAL X-RAY ABSORPTIOMETRY (DXA) FOR BONE MINERAL DENSITY IMPRESSION: Dear Dr. Rogue Bussing, Your patient Annette Foster completed a BMD test on 02/08/2016 using the [place] (analysis version: 14.10) manufactured by [employer]. The following summarizes the results of our evaluation. PATIENT BIOGRAPHICAL: Name: Annette Foster, Annette Foster Patient ID: [id number] Birth Date: 08/22/1940 Height: 63.0 in. Gender: Female Exam Date: 02/08/2016 Weight: 174.0 lbs. Indications: Advanced Age, Breast CA, Caucasian, Diabetic, History of Fracture (Adult), Hysterectomy, Oophorectomy Bilateral, Postmenopausal Fractures: Left wrist Treatments: allegra, ASPRIN 81 MG, Flonase, glipizide, Metformin, prednisone, Prevacid, simvastatin, singulair, Synthroid, Vit D ASSESSMENT: The BMD measured at Femur Neck Right is 1.105 g/cm2 with a T-score of 0.5. This patient is considered normal according to [facility]) criteria. Site Region Measured Measured WHO Young Adult BMD Date       Age      Classification T-score AP Spine L1-L4 2/08/2016 75.3 Normal 3.4 1.609 g/cm2 DualFemur Neck Right 02/08/2016 75.3 Normal 0.5 1.105 g/cm2 World Health Organization [facility]) criteria for post-menopausal,  Caucasian Women: Normal:       T-score at or above -1 SD Osteopenia:   T-score between -1 and -2.5 SD Osteoporosis: T-score at or below -2.5 SD RECOMMENDATIONS: [place] recommends that FDA-approved medical therapies be considered in postmenopausal women and men age 5 or older with a: 1. Hip or vertebral (clinical or morphometric) fracture. 2. T-score of < -2.5 at the spine or hip. 3. Ten-year fracture probability by FRAX of 3% or greater for hip fracture or 20% or greater for major osteoporotic fracture. All treatment decisions require clinical judgment and consideration of individual patient factors, including patient preferences, co-morbidities, previous drug use, risk factors not captured in the FRAX model (e.g. falls, vitamin D deficiency, increased bone turnover, interval significant decline in bone density) and possible under - or over-estimation of fracture risk by FRAX. All patients should ensure an adequate intake of dietary calcium (1200 mg/d) and vitamin D (800 IU daily) unless contraindicated. FOLLOW-UP: People with diagnosed cases of osteoporosis or at high risk for fracture should have regular bone mineral density tests. For patients eligible for Medicare, routine testing is allowed once every 2 years. The testing frequency can be increased to one year for patients who have rapidly progressing disease, those who are receiving or discontinuing medical therapy to restore bone mass, or have additional risk factors. I have reviewed this report, and agree with the above findings. [facility] Radiology Electronically Signed   By: Lowella Grip III M.D.   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 Will start arimidex today. Have her follow-up in 3 months to see how she is tolerating.   # Discussed osteoporosis; Dexa  scan completed 02/08/16: T-score 0.5 (normal). Will repeat in one year while on Arimidex  # Also discussed the role of  antihormone therapy /Arimidex for primary prevention of breast cancer; discussed the potential mechanisms of action and side effects. Agreed that it is okay to begin AI today given recent infections.   # thrombocytopenia: PLT today 127. Continue to monitor. Will repeat labs in 6 months.  Educated patient on signs and symptoms of low platelets.   #elevated liver enzymes: AST 71. Continue to monitor. Possibly due to infection/inflammation.   #Colitis: Continue to follow-up with PCP. Educated on the importance of hydration with frequent stools. Electrolytes stable.   # MD visit in 3 months to discuss tolerance of AI.  All questions were answered. The patient knows to call the clinic with any problems, questions or concerns.   Faythe Casa, NP     Cammie Sickle, MD 02/15/2016 12:12 PM

## 2016-02-15 NOTE — Progress Notes (Signed)
Patient here today for follow up.   

## 2016-02-15 NOTE — Assessment & Plan Note (Addendum)
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 Will start arimidex today. Have her follow-up in 3 months to see how she is tolerating.   # Discussed osteoporosis; Dexa scan completed 02/08/16: T-score 0.5 (normal). Will repeat in one year while on Arimidex  # Also discussed the role of  antihormone therapy /Arimidex for primary prevention of breast cancer; discussed the potential mechanisms of action and side effects. Agreed that it is okay to begin AI today given recent infections.   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 Continue to monitor. Will repeat labs in 6 months.  Educated patient on signs and symptoms of low platelets.   #elevated liver enzymes: AST 71. Continue to monitor. Possibly due to infection/inflammation.   #Colitis: Continue to follow-up with PCP. Educated on the importance of hydration with frequent stools. Electrolytes stable.   # MD visit in 3 months to discuss tolerance of AI.  All questions were answered. The patient knows to call the clinic with any problems, questions or concerns.   Faythe Casa, NP  I personally interviewed and examined the patient. Agreed with the above plan of care. Patient/family questions were answered. Dr.Brahmanday MD

## 2016-02-16 ENCOUNTER — Ambulatory Visit: Payer: Medicare Other | Admitting: Internal Medicine

## 2016-02-16 ENCOUNTER — Other Ambulatory Visit: Payer: Medicare Other

## 2016-02-19 ENCOUNTER — Ambulatory Visit
Admission: RE | Admit: 2016-02-19 | Discharge: 2016-02-19 | Disposition: A | Discharge status: Home / Self Care | Payer: Medicare Other | Source: Ambulatory Visit | Attending: Internal Medicine | Admitting: Internal Medicine

## 2016-02-19 ENCOUNTER — Other Ambulatory Visit: Payer: Self-pay | Admitting: Internal Medicine

## 2016-02-19 DIAGNOSIS — J189 Pneumonia, unspecified organism: Secondary | ICD-10-CM

## 2016-02-19 DIAGNOSIS — I517 Cardiomegaly: Secondary | ICD-10-CM | POA: Diagnosis not present

## 2016-02-28 ENCOUNTER — Encounter: Payer: Medicare Other | Admitting: Obstetrics and Gynecology

## 2016-03-06 ENCOUNTER — Other Ambulatory Visit
Admission: RE | Admit: 2016-03-06 | Discharge: 2016-03-06 | Disposition: A | Discharge status: Home / Self Care | Payer: Medicare Other | Source: Ambulatory Visit | Attending: Nurse Practitioner | Admitting: Nurse Practitioner

## 2016-03-06 DIAGNOSIS — R197 Diarrhea, unspecified: Secondary | ICD-10-CM | POA: Diagnosis present

## 2016-03-06 LAB — C DIFFICILE QUICK SCREEN W PCR REFLEX
C Diff antigen: NEGATIVE
C Diff interpretation: NOT DETECTED
C Diff toxin: NEGATIVE

## 2016-03-07 ENCOUNTER — Ambulatory Visit
Admission: RE | Admit: 2016-03-07 | Discharge: 2016-03-07 | Disposition: A | Discharge status: Home / Self Care | Payer: Medicare Other | Source: Ambulatory Visit | Attending: Nurse Practitioner | Admitting: Nurse Practitioner

## 2016-03-07 DIAGNOSIS — R195 Other fecal abnormalities: Secondary | ICD-10-CM | POA: Insufficient documentation

## 2016-03-07 DIAGNOSIS — K5730 Diverticulosis of large intestine without perforation or abscess without bleeding: Secondary | ICD-10-CM | POA: Diagnosis not present

## 2016-03-07 DIAGNOSIS — R197 Diarrhea, unspecified: Secondary | ICD-10-CM | POA: Insufficient documentation

## 2016-03-07 DIAGNOSIS — R1084 Generalized abdominal pain: Secondary | ICD-10-CM | POA: Insufficient documentation

## 2016-03-07 DIAGNOSIS — I700 Atherosclerosis of aorta: Secondary | ICD-10-CM | POA: Diagnosis not present

## 2016-03-07 MED ORDER — IOPAMIDOL (ISOVUE-300) INJECTION 61%
100.0000 mL | Freq: Once | INTRAVENOUS | Status: AC | PRN
  Administered 2016-03-07: 100 mL via INTRAVENOUS

## 2016-03-08 ENCOUNTER — Encounter: Payer: Self-pay | Admitting: *Deleted

## 2016-03-11 ENCOUNTER — Encounter: Payer: Self-pay | Admitting: Anesthesiology

## 2016-03-11 ENCOUNTER — Ambulatory Visit: Payer: Medicare Other | Admitting: Anesthesiology

## 2016-03-11 ENCOUNTER — Encounter
Admission: RE | Disposition: A | Discharge status: Home / Self Care | Payer: Self-pay | Source: Ambulatory Visit | Attending: Unknown Physician Specialty

## 2016-03-11 ENCOUNTER — Ambulatory Visit
Admission: RE | Admit: 2016-03-11 | Discharge: 2016-03-11 | Disposition: A | Discharge status: Home / Self Care | Payer: Medicare Other | Source: Ambulatory Visit | Attending: Unknown Physician Specialty | Admitting: Unknown Physician Specialty

## 2016-03-11 DIAGNOSIS — Z881 Allergy status to other antibiotic agents status: Secondary | ICD-10-CM | POA: Insufficient documentation

## 2016-03-11 DIAGNOSIS — Z8719 Personal history of other diseases of the digestive system: Secondary | ICD-10-CM | POA: Insufficient documentation

## 2016-03-11 DIAGNOSIS — Z885 Allergy status to narcotic agent status: Secondary | ICD-10-CM | POA: Diagnosis not present

## 2016-03-11 DIAGNOSIS — Z7982 Long term (current) use of aspirin: Secondary | ICD-10-CM | POA: Diagnosis not present

## 2016-03-11 DIAGNOSIS — Z833 Family history of diabetes mellitus: Secondary | ICD-10-CM | POA: Insufficient documentation

## 2016-03-11 DIAGNOSIS — D120 Benign neoplasm of cecum: Secondary | ICD-10-CM | POA: Diagnosis not present

## 2016-03-11 DIAGNOSIS — Z8249 Family history of ischemic heart disease and other diseases of the circulatory system: Secondary | ICD-10-CM | POA: Insufficient documentation

## 2016-03-11 DIAGNOSIS — I110 Hypertensive heart disease with heart failure: Secondary | ICD-10-CM | POA: Insufficient documentation

## 2016-03-11 DIAGNOSIS — Z90710 Acquired absence of both cervix and uterus: Secondary | ICD-10-CM | POA: Diagnosis not present

## 2016-03-11 DIAGNOSIS — N6019 Diffuse cystic mastopathy of unspecified breast: Secondary | ICD-10-CM | POA: Diagnosis not present

## 2016-03-11 DIAGNOSIS — Z888 Allergy status to other drugs, medicaments and biological substances status: Secondary | ICD-10-CM | POA: Insufficient documentation

## 2016-03-11 DIAGNOSIS — K589 Irritable bowel syndrome without diarrhea: Secondary | ICD-10-CM | POA: Insufficient documentation

## 2016-03-11 DIAGNOSIS — Z8042 Family history of malignant neoplasm of prostate: Secondary | ICD-10-CM | POA: Insufficient documentation

## 2016-03-11 DIAGNOSIS — K210 Gastro-esophageal reflux disease with esophagitis: Secondary | ICD-10-CM | POA: Insufficient documentation

## 2016-03-11 DIAGNOSIS — D649 Anemia, unspecified: Secondary | ICD-10-CM | POA: Insufficient documentation

## 2016-03-11 DIAGNOSIS — Z800 Family history of malignant neoplasm of digestive organs: Secondary | ICD-10-CM | POA: Insufficient documentation

## 2016-03-11 DIAGNOSIS — D509 Iron deficiency anemia, unspecified: Secondary | ICD-10-CM | POA: Diagnosis not present

## 2016-03-11 DIAGNOSIS — E785 Hyperlipidemia, unspecified: Secondary | ICD-10-CM | POA: Diagnosis not present

## 2016-03-11 DIAGNOSIS — K2970 Gastritis, unspecified, without bleeding: Secondary | ICD-10-CM | POA: Diagnosis not present

## 2016-03-11 DIAGNOSIS — G4730 Sleep apnea, unspecified: Secondary | ICD-10-CM | POA: Diagnosis not present

## 2016-03-11 DIAGNOSIS — K921 Melena: Secondary | ICD-10-CM | POA: Insufficient documentation

## 2016-03-11 DIAGNOSIS — I509 Heart failure, unspecified: Secondary | ICD-10-CM | POA: Insufficient documentation

## 2016-03-11 DIAGNOSIS — R569 Unspecified convulsions: Secondary | ICD-10-CM | POA: Insufficient documentation

## 2016-03-11 DIAGNOSIS — Z9849 Cataract extraction status, unspecified eye: Secondary | ICD-10-CM | POA: Insufficient documentation

## 2016-03-11 DIAGNOSIS — Z818 Family history of other mental and behavioral disorders: Secondary | ICD-10-CM | POA: Insufficient documentation

## 2016-03-11 DIAGNOSIS — E079 Disorder of thyroid, unspecified: Secondary | ICD-10-CM | POA: Diagnosis not present

## 2016-03-11 DIAGNOSIS — K3189 Other diseases of stomach and duodenum: Secondary | ICD-10-CM | POA: Insufficient documentation

## 2016-03-11 DIAGNOSIS — E119 Type 2 diabetes mellitus without complications: Secondary | ICD-10-CM | POA: Diagnosis not present

## 2016-03-11 DIAGNOSIS — K640 First degree hemorrhoids: Secondary | ICD-10-CM | POA: Insufficient documentation

## 2016-03-11 DIAGNOSIS — K52831 Collagenous colitis: Secondary | ICD-10-CM | POA: Diagnosis present

## 2016-03-11 DIAGNOSIS — Z9049 Acquired absence of other specified parts of digestive tract: Secondary | ICD-10-CM | POA: Insufficient documentation

## 2016-03-11 DIAGNOSIS — Z79899 Other long term (current) drug therapy: Secondary | ICD-10-CM | POA: Diagnosis not present

## 2016-03-11 DIAGNOSIS — K766 Portal hypertension: Secondary | ICD-10-CM | POA: Insufficient documentation

## 2016-03-11 DIAGNOSIS — Z9012 Acquired absence of left breast and nipple: Secondary | ICD-10-CM | POA: Insufficient documentation

## 2016-03-11 DIAGNOSIS — Z803 Family history of malignant neoplasm of breast: Secondary | ICD-10-CM | POA: Insufficient documentation

## 2016-03-11 DIAGNOSIS — Z8349 Family history of other endocrine, nutritional and metabolic diseases: Secondary | ICD-10-CM | POA: Insufficient documentation

## 2016-03-11 HISTORY — DX: Diarrhea, unspecified: R19.7

## 2016-03-11 HISTORY — DX: Hyperlipidemia, unspecified: E78.5

## 2016-03-11 HISTORY — DX: Iron deficiency anemia, unspecified: D50.9

## 2016-03-11 HISTORY — PX: COLONOSCOPY WITH PROPOFOL: SHX5780

## 2016-03-11 HISTORY — DX: Unspecified abdominal pain: R10.9

## 2016-03-11 HISTORY — DX: Essential (primary) hypertension: I10

## 2016-03-11 HISTORY — PX: ESOPHAGOGASTRODUODENOSCOPY (EGD) WITH PROPOFOL: SHX5813

## 2016-03-11 LAB — GLUCOSE, CAPILLARY: Glucose-Capillary: 98 mg/dL (ref 65–99)

## 2016-03-11 SURGERY — COLONOSCOPY WITH PROPOFOL
Anesthesia: General

## 2016-03-11 MED ORDER — GLYCOPYRROLATE 0.2 MG/ML IJ SOLN
INTRAMUSCULAR | Status: DC | PRN
  Administered 2016-03-11 (×2): 0.1 mg via INTRAVENOUS

## 2016-03-11 MED ORDER — MIDAZOLAM HCL 5 MG/5ML IJ SOLN
INTRAMUSCULAR | Status: DC | PRN
  Administered 2016-03-11 (×2): 1 mg via INTRAVENOUS

## 2016-03-11 MED ORDER — ONDANSETRON HCL 4 MG/2ML IJ SOLN
INTRAMUSCULAR | Status: AC
  Filled 2016-03-11: qty 2

## 2016-03-11 MED ORDER — MIDAZOLAM HCL 2 MG/2ML IJ SOLN
INTRAMUSCULAR | Status: AC
  Filled 2016-03-11: qty 2

## 2016-03-11 MED ORDER — PROPOFOL 10 MG/ML IV BOLUS
INTRAVENOUS | Status: DC | PRN
  Administered 2016-03-11: 10 mg via INTRAVENOUS
  Administered 2016-03-11: 30 mg via INTRAVENOUS
  Administered 2016-03-11: 20 mg via INTRAVENOUS

## 2016-03-11 MED ORDER — PROPOFOL 10 MG/ML IV BOLUS
INTRAVENOUS | Status: AC
  Filled 2016-03-11: qty 20

## 2016-03-11 MED ORDER — SODIUM CHLORIDE 0.9 % IV SOLN
INTRAVENOUS | Status: DC
  Administered 2016-03-11: 1000 mL via INTRAVENOUS

## 2016-03-11 MED ORDER — SODIUM CHLORIDE 0.9 % IV SOLN: INTRAVENOUS | Status: DC

## 2016-03-11 MED ORDER — PROPOFOL 500 MG/50ML IV EMUL
INTRAVENOUS | Status: DC | PRN
  Administered 2016-03-11: 75 ug/kg/min via INTRAVENOUS

## 2016-03-11 MED ORDER — ONDANSETRON HCL 4 MG/2ML IJ SOLN
INTRAMUSCULAR | Status: DC | PRN
  Administered 2016-03-11: 4 mg via INTRAVENOUS

## 2016-03-11 MED ORDER — LIDOCAINE HCL (PF) 2 % IJ SOLN
INTRAMUSCULAR | Status: DC | PRN
  Administered 2016-03-11: 50 mg

## 2016-03-11 NOTE — Op Note (Addendum)
[facility] Gastroenterology Patient Name: Annette Foster Procedure Date: 03/11/2016 10:55 AM MRN: [id number] Account #: [id number] Date of Birth: 1940/04/29 Admit Type: Outpatient Age: 76 Room: [facility] ENDO ROOM 1 Gender: Female Note Status: Supervisor Override Procedure:            Colonoscopy Indications:          Follow up collagenous microscopic colitis Providers:            Manya Silvas, MD Referring MD:         Leona Carry. Hall Busing, MD (Referring MD) Medicines:            Propofol per Anesthesia Complications:        No immediate complications. Procedure:            Pre-Anesthesia Assessment:                       - After reviewing the risks and benefits, the patient                        was deemed in satisfactory condition to undergo the                        procedure.                       After obtaining informed consent, the colonoscope was                        passed under direct vision. Throughout the procedure,                        the patient's blood pressure, pulse, and oxygen                        saturations were monitored continuously. The                        Colonoscope was introduced through the anus and                        advanced to the the cecum, identified by appendiceal                        orifice and ileocecal valve. The colonoscopy was                        somewhat difficult due to a tortuous colon. Successful                        completion of the procedure was aided by withdrawing                        the scope and replacing with the adult upper endoscope.                        The patient tolerated the procedure well. The quality                        of the bowel preparation was good. Findings:      A small polyp was found in the  cecum. The polyp was sessile. The polyp       was removed with a hot snare. Resection and retrieval were complete.      Internal hemorrhoids were found during endoscopy. The hemorrhoids  were       small and Grade I (internal hemorrhoids that do not prolapse).      Due to history of collagenous colitis I biopsied the colon in ascending,       transverse,descending, sigmoid colon for follow up. Impression:           - One small polyp in the cecum, removed with a hot                        snare. Resected and retrieved.                       - Internal hemorrhoids. Recommendation:       - Await pathology results. Manya Silvas, MD 03/11/2016 12:07:37 PM This report has been signed electronically. Number of Addenda: 0 Note Initiated On: 03/11/2016 10:55 AM Scope Withdrawal Time: 0 hours 16 minutes 45 seconds  Total Procedure Duration: 0 hours 42 minutes 56 seconds       [facility]

## 2016-03-11 NOTE — H&P (Signed)
Primary Care Physician:  Albina Billet, MD Primary Gastroenterologist:  Dr. Vira Agar  Pre-Procedure History & Physical: HPI:  Annette Foster is a 76 y.o. female is here for an endoscopy and colonoscopy.   Past Medical History:  Diagnosis Date  . Abdominal pain   . Allergy   . Asthma   . Cancer [facility]) 2017   breast cancer  . Cataract   . CHF (congestive heart failure) ([place])   . Collagenous colitis   . Diabetes mellitus without complication ([place])   . Diarrhea   . Fibrocystic breast   . GERD (gastroesophageal reflux disease)   . Heart murmur   . Hyperlipidemia   . Hypertension   . Hypothyroidism   . IBS (irritable bowel syndrome)   . IBS (irritable bowel syndrome)   . IDA (iron deficiency anemia)   . PONV (postoperative nausea and vomiting)   . Seizures ([place])   . Sleep apnea     Past Surgical History:  Procedure Laterality Date  . ABDOMINAL HYSTERECTOMY     tah bso  . ABDOMINAL SURGERY    . APPENDECTOMY    . BREAST BIOPSY Bilateral    negative  . BREAST 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/2015   stereo pending  . CARDIAC SURGERY    . CATARACT EXTRACTION Right   . CHOLECYSTECTOMY    . JOINT REPLACEMENT Left    TKR  . left sinusplasty     . MASTECTOMY, PARTIAL Left 10/17/2015   Procedure: MASTECTOMY PARTIAL REVISION;  Surgeon: Leonie Green, MD;  Location: [facility] ORS;  Service: General;  Laterality: Left;  . PARTIAL MASTECTOMY WITH NEEDLE LOCALIZATION Left 09/29/2015   Procedure: PARTIAL MASTECTOMY WITH NEEDLE LOCALIZATION;  Surgeon: Leonie Green, MD;  Location: [facility] ORS;  Service: General;  Laterality: Left;  . TOTAL ABDOMINAL HYSTERECTOMY W/ BILATERAL SALPINGOOPHORECTOMY      Prior to Admission medications   Medication Sig Start Date End Date Taking? Authorizing Provider  anastrozole (ARIMIDEX) 1 MG tablet Take 1 tablet (1 mg total) by mouth daily. 12/13/15  Yes Cammie Sickle, MD  aspirin 81 MG tablet Take 81 mg by mouth at bedtime.   Yes Historical Provider, MD   budesonide (ENTOCORT EC) 3 MG 24 hr capsule Take 9 mg qd for a month,  6 mg daily for a month, and then 6 mg every other day to follow. 02/13/16  Yes Historical Provider, MD  budesonide-formoterol (SYMBICORT) 80-4.5 MCG/ACT inhaler Inhale 2 puffs into the lungs 2 (two) times daily.   Yes Historical Provider, MD  cholecalciferol (VITAMIN D) 1000 units tablet Take 2,000 Units by mouth daily.    Yes Historical Provider, MD  clobetasol ointment (TEMOVATE) 2.35 % Apply 1 application topically 2 (two) times daily. 01/16/16  Yes Alanda Slim Defrancesco, MD  colestipol (COLESTID) 1 g tablet Take 2 g by mouth daily.  01/10/16 01/09/17 Yes Historical Provider, MD  Cyanocobalamin (B-12 COMPLIANCE INJECTION) 1000 MCG/ML KIT Inject 1 Syringe as directed every 30 (thirty) days.   Yes Historical Provider, MD  escitalopram (LEXAPRO) 5 MG tablet Take 5 mg by mouth at bedtime.   Yes Historical Provider, MD  esomeprazole (NEXIUM) 40 MG capsule Take by mouth. 02/08/16  Yes Historical Provider, MD  fenofibrate 160 MG tablet Take 160 mg by mouth at bedtime.   Yes Historical Provider, MD  fexofenadine (ALLEGRA) 180 MG tablet Take 180 mg by mouth at bedtime.    Yes Historical Provider, MD  fluticasone (FLONASE) 50 MCG/ACT nasal spray Place 1 spray into  the nose daily as needed for allergies.    Yes Historical Provider, MD  furosemide (LASIX) 20 MG tablet Take 20-40 mg by mouth every other day. Alternate between 1 a day and 2 a day   Yes Historical Provider, MD  glimepiride (AMARYL) 2 MG tablet Take 2 mg by mouth at bedtime.   Yes Historical Provider, MD  Hyoscyamine Sulfate 0.375 MG TBCR Take 0.375 mg by mouth 2 (two) times daily.    Yes Historical Provider, MD  lansoprazole (PREVACID) 30 MG capsule Take 30 mg by mouth daily as needed (reflux).    Yes Historical Provider, MD  levothyroxine (SYNTHROID, LEVOTHROID) 75 MCG tablet Take 75 mcg by mouth daily before breakfast.    Yes Historical Provider, MD  metFORMIN (GLUCOPHAGE-XR)  750 MG 24 hr tablet Take 750 mg by mouth 2 (two) times daily.    Yes Historical Provider, MD  montelukast (SINGULAIR) 10 MG tablet Take 10 mg by mouth at bedtime.    Yes Historical Provider, MD  nystatin cream (MYCOSTATIN) Apply topically. 02/08/16 02/07/17 Yes Historical Provider, MD  olopatadine (PATANOL) 0.1 % ophthalmic solution 1 drop 2 (two) times daily.   Yes Historical Provider, MD  saccharomyces boulardii (FLORASTOR) 250 MG capsule Take 250 mg by mouth 2 (two) times daily.    Yes Historical Provider, MD  simvastatin (ZOCOR) 20 MG tablet Take 20 mg by mouth at bedtime.   Yes Historical Provider, MD  traMADol (ULTRAM) 50 MG tablet Take 50 mg by mouth every 6 (six) hours as needed.   Yes Historical Provider, MD  valsartan (DIOVAN) 160 MG tablet Take 160 mg by mouth every morning.    Yes Historical Provider, MD    Allergies as of 03/08/2016 - Review Complete 03/08/2016  Allergen Reaction Noted  . Fentanyl Shortness Of Breath, Itching, and Nausea And Vomiting 06/28/2014  . Codeine Itching and Nausea And Vomiting 06/28/2014  . Demerol [meperidine] Itching and Nausea And Vomiting 06/28/2014  . Hydrocodone-acetaminophen Itching and Nausea And Vomiting 06/28/2014  . Other Other (See Comments) 04/10/2012  . Oxycodone Itching and Nausea And Vomiting 09/26/2015  . Pentazocine Other (See Comments) 04/10/2012  . Tetracyclines & related Rash 06/28/2014    Family History  Problem Relation Age of Onset  . Breast cancer Paternal Grandmother   . Colon cancer Father   . Diabetes Sister   . Diabetes Brother   . Heart disease Brother   . Colon cancer Maternal Uncle   . Ovarian cancer Neg Hx     Social History   Social History  . Marital status: Married    Spouse name: N/A  . Number of children: N/A  . Years of education: N/A   Occupational History  . Not on file.   Social History Main Topics  . Smoking status: Never Smoker  . Smokeless tobacco: Never Used  . Alcohol use No  . Drug  use: No  . Sexual activity: Not Currently    Birth control/ protection: Surgical   Other Topics Concern  . Not on file   Social History Narrative  . No narrative on file    Review of Systems: See HPI, otherwise negative ROS, has diarrhea from collagenous colitis, diabetes for 15 years.  Physical Exam: BP 138/72   Pulse (!) 54   Temp 97.1 F (36.2 C) (Tympanic)   Resp 20   Ht 5' 3" (1.6 m)   Wt 77.1 kg (170 lb)   LMP  (LMP Unknown)   SpO2 98%  BMI 30.11 kg/m  General:   Alert,  pleasant and cooperative in NAD Head:  Normocephalic and atraumatic. Neck:  Supple; no masses or thyromegaly. Lungs:  Clear throughout to auscultation.    Heart:  Regular rate and rhythm. Abdomen:  Soft, nontender and nondistended. Normal bowel sounds, without guarding, and without rebound.   Neurologic:  Alert and  oriented x4;  grossly normal neurologically.  Impression/Plan: Annette Foster is here for an endoscopy and colonoscopy to be performed for collagenous colitis, heme positive stool, bilateral lower abd pain.  Risks, benefits, limitations, and alternatives regarding  endoscopy and colonoscopy have been reviewed with the patient.  Questions have been answered.  All parties agreeable.   Gaylyn Cheers, MD  03/11/2016, 11:00 AM

## 2016-03-11 NOTE — Transfer of Care (Signed)
Immediate Anesthesia Transfer of Care Note  Patient: Nelda Siebold  Procedure(s) Performed: Procedure(s): COLONOSCOPY WITH PROPOFOL (N/A) ESOPHAGOGASTRODUODENOSCOPY (EGD) WITH PROPOFOL (N/A)  Patient Location: PACU  Anesthesia Type:General  Level of Consciousness: sedated  Airway & Oxygen Therapy: Patient Spontanous Breathing and Patient connected to nasal cannula oxygen  Post-op Assessment: Report given to RN and Post -op Vital signs reviewed and stable  Post vital signs: Reviewed and stable  Last Vitals:  Vitals:   03/11/16 0936 03/11/16 1206  BP: 138/72   Pulse: (!) 54   Resp: 20   Temp: 36.2 C 36.5 C    Last Pain:  Vitals:   03/11/16 1206  TempSrc: Tympanic         Complications: No apparent anesthesia complications

## 2016-03-11 NOTE — Anesthesia Preprocedure Evaluation (Addendum)
Anesthesia Evaluation  Patient identified by MRN, date of birth, ID band Patient awake    Reviewed: Allergy & Precautions, NPO status , Patient's Chart, lab work & pertinent test results, reviewed documented beta blocker date and time   History of Anesthesia Complications (+) PONV and history of anesthetic complications  Airway Mallampati: III  TM Distance: >3 FB     Dental  (+) Chipped, Partial Lower, Partial Upper   Pulmonary asthma , sleep apnea ,           Cardiovascular hypertension, Pt. on medications +CHF  + Valvular Problems/Murmurs      Neuro/Psych Seizures -,     GI/Hepatic GERD  ,  Endo/Other  diabetes, Type 2Hypothyroidism   Renal/GU      Musculoskeletal   Abdominal   Peds  Hematology  (+) anemia ,   Anesthesia Other Findings IBS. CPAP.Lasix. Mitral valve replace. 1972.  Reproductive/Obstetrics                           Anesthesia Physical Anesthesia Plan  ASA: III  Anesthesia Plan: General   Post-op Pain Management:    Induction: Intravenous  Airway Management Planned: Nasal Cannula  Additional Equipment:   Intra-op Plan:   Post-operative Plan:   Informed Consent: I have reviewed the patients History and Physical, chart, labs and discussed the procedure including the risks, benefits and alternatives for the proposed anesthesia with the patient or authorized representative who has indicated his/her understanding and acceptance.     Plan Discussed with: CRNA  Anesthesia Plan Comments:         Anesthesia Quick Evaluation

## 2016-03-11 NOTE — Transfer of Care (Signed)
Immediate Anesthesia Transfer of Care Note  Patient: Annette Foster  Procedure(s) Performed: Procedure(s): COLONOSCOPY WITH PROPOFOL (N/A) ESOPHAGOGASTRODUODENOSCOPY (EGD) WITH PROPOFOL (N/A)  Patient Location: PACU  Anesthesia Type:General  Level of Consciousness: sedated  Airway & Oxygen Therapy: Patient Spontanous Breathing and Patient connected to nasal cannula oxygen  Post-op Assessment: Report given to RN and Post -op Vital signs reviewed and stable  Post vital signs: Reviewed and stable  Last Vitals:  Vitals:   03/11/16 0936 03/11/16 1206  BP: 138/72   Pulse: (!) 54   Resp: 20   Temp: 36.2 C 36.5 C    Last Pain:  Vitals:   03/11/16 1206  TempSrc: Tympanic         Complications: No apparent anesthesia complications

## 2016-03-11 NOTE — Anesthesia Post-op Follow-up Note (Cosign Needed)
Anesthesia QCDR form completed.        

## 2016-03-11 NOTE — Op Note (Addendum)
[facility] Gastroenterology Patient Name: Annette Foster Procedure Date: 03/11/2016 10:56 AM MRN: [id number] Account #: [id number] Date of Birth: 1940-08-29 Admit Type: Outpatient Age: 77 Room: [facility] ENDO ROOM 1 Gender: Female Note Status: Supervisor Override Procedure:            Upper GI endoscopy Indications:          Heme positive stool Providers:            Manya Silvas, MD Referring MD:         Leona Carry. Hall Busing, MD (Referring MD) Medicines:            Propofol per Anesthesia Complications:        No immediate complications. Procedure:            Pre-Anesthesia Assessment:                       - After reviewing the risks and benefits, the patient                        was deemed in satisfactory condition to undergo the                        procedure.                       After obtaining informed consent, the endoscope was                        passed under direct vision. Throughout the procedure,                        the patient's blood pressure, pulse, and oxygen                        saturations were monitored continuously. The Endoscope                        was introduced through the mouth, and advanced to the                        second part of duodenum. The upper GI endoscopy was                        accomplished without difficulty. The patient tolerated                        the procedure well. Findings:      LA Grade A (one or more mucosal breaks less than 5 mm, not extending       between tops of 2 mucosal folds) esophagitis with no bleeding was found       39 cm from the incisors.      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      Normal mucosa was found in the gastric antrum. Biopsies were taken with       a cold forceps for Helicobacter pylori testing.      The examined duodenum was normal. Impression:           - LA Grade A reflux esophagitis.                       -  Portal hypertensive gastropathy.                      - Normal mucosa was found in the antrum. Biopsied.                       - Normal examined duodenum. Recommendation:       - Await pathology results. Manya Silvas, MD 03/11/2016 11:18:27 AM This report has been signed electronically. Number of Addenda: 0 Note Initiated On: 03/11/2016 10:56 AM      [facility]

## 2016-03-11 NOTE — Anesthesia Postprocedure Evaluation (Signed)
Anesthesia Post Note  Patient: Annette Foster  Procedure(s) Performed: Procedure(s) (LRB): COLONOSCOPY WITH PROPOFOL (N/A) ESOPHAGOGASTRODUODENOSCOPY (EGD) WITH PROPOFOL (N/A)  Patient location during evaluation: Endoscopy Anesthesia Type: General Level of consciousness: awake and alert Pain management: pain level controlled Vital Signs Assessment: post-procedure vital signs reviewed and stable Respiratory status: spontaneous breathing, nonlabored ventilation, respiratory function stable and patient connected to nasal cannula oxygen Cardiovascular status: blood pressure returned to baseline and stable Postop Assessment: no signs of nausea or vomiting Anesthetic complications: no     Last Vitals:  Vitals:   03/11/16 1226 03/11/16 1236  BP: (!) 152/62 (!) 152/57  Pulse: (!) 50 (!) 52  Resp: 13   Temp:      Last Pain:  Vitals:   03/11/16 1206  TempSrc: Tympanic                 Shay Bartoli S

## 2016-03-12 ENCOUNTER — Encounter: Payer: Self-pay | Admitting: Unknown Physician Specialty

## 2016-03-12 LAB — SURGICAL PATHOLOGY

## 2016-03-13 ENCOUNTER — Ambulatory Visit (INDEPENDENT_AMBULATORY_CARE_PROVIDER_SITE_OTHER): Payer: Medicare Other | Admitting: Obstetrics and Gynecology

## 2016-03-13 ENCOUNTER — Encounter: Payer: Self-pay | Admitting: Obstetrics and Gynecology

## 2016-03-13 VITALS — BP 158/59 | HR 58 | Ht 63.0 in | Wt 179.4 lb

## 2016-03-13 DIAGNOSIS — N763 Subacute and chronic vulvitis: Secondary | ICD-10-CM

## 2016-03-13 DIAGNOSIS — L292 Pruritus vulvae: Secondary | ICD-10-CM

## 2016-03-13 MED ORDER — CLOBETASOL PROPIONATE 0.05 % EX OINT: 1.0000 "application " | TOPICAL_OINTMENT | Freq: Two times a day (BID) | CUTANEOUS | 1 refills | Status: DC

## 2016-03-13 NOTE — Patient Instructions (Signed)
1. Continue applying Temovate ointment daily for the next 8 weeks 2. Return in 8 weeks for follow-up

## 2016-03-13 NOTE — Progress Notes (Signed)
GYN ENCOUNTER NOTE  Subjective:       Annette Foster is a 76 y.o. [id number] female is here for gynecologic evaluation of the following issues:  1. 6 week follow up for chronic vulvitis - biopsy from 01/12/2016 showed benign vulvar tissue with chronic inflammation. Has been using Temovate topically 2x daily around affected area. Reports symptoms are "about 60% better." On exam, noted symmetric hyperemia of labia minora and majora, extending down to perineum and anus. Redness and itching around anus may be due to recent trouble with diarrhea.  2. Vulvar itching - periclitoral itching persists   Gynecologic History No LMP recorded (lmp unknown). Patient has had a hysterectomy. Contraception: hysterectomy   Obstetric History OB History  Gravida Para Term Preterm AB Living  2 2 2     2   SAB TAB Ectopic Multiple Live Births          2    # Outcome Date GA Lbr Len/2nd Weight Sex Delivery Anes PTL Lv  2 Term 1966    F Vag-Spont   LIV  1 Term 1963    F Vag-Spont   LIV      Past Medical History:  Diagnosis Date  . Abdominal pain   . Allergy   . Asthma   . Cancer [facility]) 2017   breast cancer  . Cataract   . CHF (congestive heart failure) ([place])   . Collagenous colitis   . Diabetes mellitus without complication ([place])   . Diarrhea   . Diverticulosis   . Fibrocystic breast   . GERD (gastroesophageal reflux disease)   . Heart murmur   . Hyperlipidemia   . Hypertension   . Hypothyroidism   . IBS (irritable bowel syndrome)   . IBS (irritable bowel syndrome)   . IDA (iron deficiency anemia)   . PONV (postoperative nausea and vomiting)   . Seizures ([place])   . Sleep apnea     Past Surgical History:  Procedure Laterality Date  . ABDOMINAL HYSTERECTOMY     tah bso  . ABDOMINAL SURGERY    . APPENDECTOMY    . BREAST BIOPSY Bilateral    negative  . BREAST 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/2015   stereo pending  . CARDIAC SURGERY    . CATARACT EXTRACTION Right   . CHOLECYSTECTOMY    . COLONOSCOPY  WITH PROPOFOL N/A 03/11/2016   Procedure: COLONOSCOPY WITH PROPOFOL;  Surgeon: Manya Silvas, MD;  Location: [facility] ENDOSCOPY;  Service: Endoscopy;  Laterality: N/A;  . ESOPHAGOGASTRODUODENOSCOPY (EGD) WITH PROPOFOL N/A 03/11/2016   Procedure: ESOPHAGOGASTRODUODENOSCOPY (EGD) WITH PROPOFOL;  Surgeon: Manya Silvas, MD;  Location: [facility] ENDOSCOPY;  Service: Endoscopy;  Laterality: N/A;  . JOINT REPLACEMENT Left    TKR  . left sinusplasty     . MASTECTOMY, PARTIAL Left 10/17/2015   Procedure: MASTECTOMY PARTIAL REVISION;  Surgeon: Leonie Green, MD;  Location: [facility] ORS;  Service: General;  Laterality: Left;  . PARTIAL MASTECTOMY WITH NEEDLE LOCALIZATION Left 09/29/2015   Procedure: PARTIAL MASTECTOMY WITH NEEDLE LOCALIZATION;  Surgeon: Leonie Green, MD;  Location: [facility] ORS;  Service: General;  Laterality: Left;  . TOTAL ABDOMINAL HYSTERECTOMY W/ BILATERAL SALPINGOOPHORECTOMY      Current Outpatient Prescriptions on File Prior to Visit  Medication Sig Dispense Refill  . anastrozole (ARIMIDEX) 1 MG tablet Take 1 tablet (1 mg total) by mouth daily. 90 tablet 3  . aspirin 81 MG tablet Take 81 mg by mouth at bedtime.    . budesonide (ENTOCORT EC) 3  MG 24 hr capsule Take 9 mg qd for a month,  6 mg daily for a month, and then 6 mg every other day to follow.    . budesonide-formoterol (SYMBICORT) 80-4.5 MCG/ACT inhaler Inhale 2 puffs into the lungs 2 (two) times daily.    . cholecalciferol (VITAMIN D) 1000 units tablet Take 2,000 Units by mouth daily.     . colestipol (COLESTID) 1 g tablet Take 2 g by mouth daily.     . Cyanocobalamin (B-12 COMPLIANCE INJECTION) 1000 MCG/ML KIT Inject 1 Syringe as directed every 30 (thirty) days.    Marland Kitchen escitalopram (LEXAPRO) 5 MG tablet Take 5 mg by mouth at bedtime.    Marland Kitchen esomeprazole (NEXIUM) 40 MG capsule Take by mouth.    . fenofibrate 160 MG tablet Take 160 mg by mouth at bedtime.    . fexofenadine (ALLEGRA) 180 MG tablet Take 180 mg by mouth at  bedtime.     . fluticasone (FLONASE) 50 MCG/ACT nasal spray Place 1 spray into the nose daily as needed for allergies.     . furosemide (LASIX) 20 MG tablet Take 20-40 mg by mouth every other day. Alternate between 1 a day and 2 a day    . glimepiride (AMARYL) 2 MG tablet Take 2 mg by mouth at bedtime.    Marland Kitchen Hyoscyamine Sulfate 0.375 MG TBCR Take 0.375 mg by mouth 2 (two) times daily.     . lansoprazole (PREVACID) 30 MG capsule Take 30 mg by mouth daily as needed (reflux).     Marland Kitchen levothyroxine (SYNTHROID, LEVOTHROID) 75 MCG tablet Take 75 mcg by mouth daily before breakfast.     . metFORMIN (GLUCOPHAGE-XR) 750 MG 24 hr tablet Take 750 mg by mouth 2 (two) times daily.     . montelukast (SINGULAIR) 10 MG tablet Take 10 mg by mouth at bedtime.     Marland Kitchen nystatin cream (MYCOSTATIN) Apply topically.    Marland Kitchen olopatadine (PATANOL) 0.1 % ophthalmic solution 1 drop 2 (two) times daily.    Marland Kitchen saccharomyces boulardii (FLORASTOR) 250 MG capsule Take 250 mg by mouth 2 (two) times daily.     . simvastatin (ZOCOR) 20 MG tablet Take 20 mg by mouth at bedtime.    . traMADol (ULTRAM) 50 MG tablet Take 50 mg by mouth every 6 (six) hours as needed.    . valsartan (DIOVAN) 160 MG tablet Take 160 mg by mouth every morning.      No current facility-administered medications on file prior to visit.     Allergies  Allergen Reactions  . Fentanyl Shortness Of Breath, Itching and Nausea And Vomiting  . Codeine Itching and Nausea And Vomiting  . Demerol [Meperidine] Itching and Nausea And Vomiting  . Hydrocodone-Acetaminophen Itching and Nausea And Vomiting  . Other Other (See Comments)  . Oxycodone Itching and Nausea And Vomiting  . Pentazocine Other (See Comments)  . Tetracyclines & Related Rash    Social History   Social History  . Marital status: Married    Spouse name: N/A  . Number of children: N/A  . Years of education: N/A   Occupational History  . Not on file.   Social History Main Topics  . Smoking  status: Never Smoker  . Smokeless tobacco: Never Used  . Alcohol use No  . Drug use: No  . Sexual activity: Not Currently    Birth control/ protection: Surgical   Other Topics Concern  . Not on file   Social History Narrative  .  No narrative on file    Family History  Problem Relation Age of Onset  . Breast cancer Paternal Grandmother   . Colon cancer Father   . Diabetes Sister   . Diabetes Brother   . Heart disease Brother   . Colon cancer Maternal Uncle   . Ovarian cancer Neg Hx     The following portions of the patient's history were reviewed and updated as appropriate: allergies, current medications, past family history, past medical history, past social history, past surgical history and problem list.  Review of Systems Negative other than noted in HPI  Objective:   BP (!) 158/59   Pulse (!) 58   Ht 5' 3"  (1.6 m)   Wt 179 lb 6.4 oz (81.4 kg)   LMP  (LMP Unknown)   BMI 31.78 kg/m  CONSTITUTIONAL: Well-developed, well-nourished female in no acute distress.  HENT:  Normocephalic, atraumatic.     SKIN: Skin is warm and dry. No rash noted. Not diaphoretic. No erythema. No pallor. [place]: Alert and oriented to person, place, and time. PSYCHIATRIC: Normal mood and affect. Normal behavior. Normal judgment and thought content. CARDIOVASCULAR:Not Examined RESPIRATORY: Not Examined BREASTS: Not Examined ABDOMEN: Soft, non distended; Non tender.  No Organomegaly. PELVIC:  External Genitalia: symmetric hyperemia of labia minora, labia majora, and perianal region. (Stable)     Assessment:   1. Chronic vulvitis 2. Vulvar itching 3. Prior biopsy consistent with benign vulvar tissue with chronic inflammation      Plan:  1. Continue Temovate ointment 0.05% topically twice a day for another 2 months. 2. Return in 2 months for follow-up  Janeece Riggers, PA-S Brayton Mars, MD   I have seen, interviewed, and examined the patient in conjunction with the  [facility].A. student and affirm the diagnosis and management plan. Dawnyel Leven A. Maytte Jacot, MD, FACOG   Note: This dictation was prepared with Dragon dictation along with smaller phrase technology. Any transcriptional errors that result from this process are unintentional. Procedure note

## 2016-03-14 ENCOUNTER — Ambulatory Visit: Payer: Medicare Other | Admitting: Podiatry

## 2016-03-25 ENCOUNTER — Encounter: Payer: Self-pay | Admitting: Podiatry

## 2016-03-25 ENCOUNTER — Ambulatory Visit (INDEPENDENT_AMBULATORY_CARE_PROVIDER_SITE_OTHER): Payer: Medicare Other | Admitting: Podiatry

## 2016-03-25 DIAGNOSIS — B351 Tinea unguium: Secondary | ICD-10-CM | POA: Diagnosis not present

## 2016-03-25 DIAGNOSIS — M79676 Pain in unspecified toe(s): Secondary | ICD-10-CM | POA: Diagnosis not present

## 2016-03-25 DIAGNOSIS — E1143 Type 2 diabetes mellitus with diabetic autonomic (poly)neuropathy: Secondary | ICD-10-CM

## 2016-03-25 NOTE — Progress Notes (Signed)
Complaint:  Visit Type: Patient returns to my office for continued preventative foot care services. Complaint: Patient states" my nails have grown long and thick and become painful to walk and wear shoes" Patient has been diagnosed with DM with no foot complications. The patient presents for preventative foot care services. No changes to ROS  Podiatric Exam: Vascular: dorsalis pedis and posterior tibial pulses are palpable bilateral. Capillary return is immediate. Temperature gradient is WNL. Skin turgor WNL  Sensorium: Normal Semmes Weinstein monofilament test. Normal tactile sensation bilaterally. Nail Exam: Pt has thick disfigured discolored nails with subungual debris noted bilateral entire nail hallux through fifth toenails.  Right hallux nail plate is absent.  Surgery has healed. Ulcer Exam: There is no evidence of ulcer or pre-ulcerative changes or infection. Orthopedic Exam: Muscle tone and strength are WNL. No limitations in general ROM. No crepitus or effusions noted. Foot type and digits show no abnormalities. Bony prominences are unremarkable. Skin: No Porokeratosis. No infection or ulcers  Diagnosis:  Onychomycosis, , Pain in right toe, pain in left toes  Treatment & Plan Procedures and Treatment: Consent by patient was obtained for treatment procedures. The patient understood the discussion of treatment and procedures well. All questions were answered thoroughly reviewed. Debridement of mycotic and hypertrophic toenails, 1 through 5 bilateral and clearing of subungual debris. No ulceration, no infection noted.   Return Visit-Office Procedure: Patient instructed to return to the office for a follow up visit 3 months for continued evaluation and treatment.    Megha Agnes DPM 

## 2016-04-30 ENCOUNTER — Ambulatory Visit: Admit: 2016-04-30 | Payer: Medicare Other | Admitting: Gastroenterology

## 2016-04-30 SURGERY — COLONOSCOPY WITH PROPOFOL
Anesthesia: General

## 2016-05-07 ENCOUNTER — Other Ambulatory Visit: Payer: Self-pay | Admitting: Surgery

## 2016-05-07 DIAGNOSIS — N63 Unspecified lump in unspecified breast: Secondary | ICD-10-CM

## 2016-05-09 ENCOUNTER — Encounter: Payer: Self-pay | Admitting: Obstetrics and Gynecology

## 2016-05-09 ENCOUNTER — Ambulatory Visit (INDEPENDENT_AMBULATORY_CARE_PROVIDER_SITE_OTHER): Payer: Medicare Other | Admitting: Obstetrics and Gynecology

## 2016-05-09 VITALS — BP 101/54 | HR 64 | Ht 63.0 in | Wt 176.0 lb

## 2016-05-09 DIAGNOSIS — N763 Subacute and chronic vulvitis: Secondary | ICD-10-CM

## 2016-05-09 DIAGNOSIS — N904 Leukoplakia of vulva: Secondary | ICD-10-CM | POA: Diagnosis not present

## 2016-05-09 DIAGNOSIS — D0512 Intraductal carcinoma in situ of left breast: Secondary | ICD-10-CM

## 2016-05-09 NOTE — Patient Instructions (Signed)
1. Apply Temovate ointment 0.05% topically daily for the next 8 weeks 2. Return in 2 months for follow-up

## 2016-05-09 NOTE — Progress Notes (Signed)
Chief complaint: 1. Chronic vulvitis 2. History of breast cancer  Patient presents for 2 month follow-up on chronic vulvar itching rheumatology which has been treated with Temovate ointment twice daily for the past 2 months. Symptoms have markedly improved-75% better. Patient appears to have recurrence of breast cancer and will be in need of further therapy. Patient does not report any problems with bowel function at this time  Past Medical History:  Diagnosis Date  . Abdominal pain   . Allergy   . Asthma   . Cancer [facility]) 2017   breast cancer  . Cataract   . CHF (congestive heart failure) ([place])   . Collagenous colitis   . Diabetes mellitus without complication ([place])   . Diarrhea   . Diverticulosis   . Fibrocystic breast   . GERD (gastroesophageal reflux disease)   . Heart murmur   . Hyperlipidemia   . Hypertension   . Hypothyroidism   . IBS (irritable bowel syndrome)   . IBS (irritable bowel syndrome)   . IDA (iron deficiency anemia)   . PONV (postoperative nausea and vomiting)   . Seizures ([place])   . Sleep apnea     Past Surgical History:  Procedure Laterality Date  . ABDOMINAL HYSTERECTOMY     tah bso  . ABDOMINAL SURGERY    . APPENDECTOMY    . BREAST BIOPSY Bilateral    negative  . BREAST 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/2015   stereo pending  . CARDIAC SURGERY    . CATARACT EXTRACTION Right   . CHOLECYSTECTOMY    . COLONOSCOPY WITH PROPOFOL N/A 03/11/2016   Procedure: COLONOSCOPY WITH PROPOFOL;  Surgeon: Manya Silvas, MD;  Location: [facility] ENDOSCOPY;  Service: Endoscopy;  Laterality: N/A;  . ESOPHAGOGASTRODUODENOSCOPY (EGD) WITH PROPOFOL N/A 03/11/2016   Procedure: ESOPHAGOGASTRODUODENOSCOPY (EGD) WITH PROPOFOL;  Surgeon: Manya Silvas, MD;  Location: [facility] ENDOSCOPY;  Service: Endoscopy;  Laterality: N/A;  . JOINT REPLACEMENT Left    TKR  . left sinusplasty     . MASTECTOMY, PARTIAL Left 10/17/2015   Procedure: MASTECTOMY PARTIAL REVISION;  Surgeon: Leonie Green,  MD;  Location: [facility] ORS;  Service: General;  Laterality: Left;  . PARTIAL MASTECTOMY WITH NEEDLE LOCALIZATION Left 09/29/2015   Procedure: PARTIAL MASTECTOMY WITH NEEDLE LOCALIZATION;  Surgeon: Leonie Green, MD;  Location: [facility] ORS;  Service: General;  Laterality: Left;  . TOTAL ABDOMINAL HYSTERECTOMY W/ BILATERAL SALPINGOOPHORECTOMY      Review of systems: Per history of present illness  OBJECTIVE: BP (!) 101/54   Pulse 64   Ht 5' 3"  (1.6 m)   Wt 176 lb (79.8 kg)   LMP  (LMP Unknown)   BMI 31.18 kg/m  Pleasant elderly female in no acute distress. Alert and oriented. Pelvic exam: External genitalia-symmetric hyperemia involving the labia majora and minora (minimal); the symmetric hyperemia extends perianal region; no significant epithelial skin breakdown or hypertrophy BUS-atrophic changes Vagina-severe atrophy  ASSESSMENT: 1. Chronic vulvitis 2. History of lichen sclerosus, suspected, but not confirmed on biopsy 3. Improvement in vulvar itching symptoms; chronic hyperemia in chronic skin changes persist  PLAN: 1. Continue Temovate ointment with decreased decreasing regimen at 1 application daily for the next 8 weeks 2. Return in 2 months for follow-up 3. Notify our office if symptoms worsen  A total of 15 minutes were spent face-to-face with the patient during this encounter and over half of that time dealt with counseling and coordination of care.  Brayton Mars, MD  Note: This dictation was prepared  with Dragon dictation along with smaller phrase technology. Any transcriptional errors that result from this process are unintentional.

## 2016-05-14 ENCOUNTER — Other Ambulatory Visit: Payer: Medicare Other

## 2016-05-14 ENCOUNTER — Other Ambulatory Visit: Payer: Self-pay | Admitting: *Deleted

## 2016-05-14 DIAGNOSIS — D0512 Intraductal carcinoma in situ of left breast: Secondary | ICD-10-CM

## 2016-05-14 DIAGNOSIS — D696 Thrombocytopenia, unspecified: Secondary | ICD-10-CM

## 2016-05-15 ENCOUNTER — Inpatient Hospital Stay: Payer: Medicare Other | Attending: Internal Medicine | Admitting: Internal Medicine

## 2016-05-15 ENCOUNTER — Inpatient Hospital Stay: Payer: Medicare Other

## 2016-05-15 VITALS — BP 127/60 | HR 64 | Temp 97.7°F | Resp 18 | Wt 181.5 lb

## 2016-05-15 DIAGNOSIS — Z170 Estrogen receptor positive status [ER+]: Secondary | ICD-10-CM | POA: Diagnosis not present

## 2016-05-15 DIAGNOSIS — Z7982 Long term (current) use of aspirin: Secondary | ICD-10-CM | POA: Insufficient documentation

## 2016-05-15 DIAGNOSIS — I509 Heart failure, unspecified: Secondary | ICD-10-CM | POA: Insufficient documentation

## 2016-05-15 DIAGNOSIS — I10 Essential (primary) hypertension: Secondary | ICD-10-CM | POA: Insufficient documentation

## 2016-05-15 DIAGNOSIS — Z923 Personal history of irradiation: Secondary | ICD-10-CM | POA: Insufficient documentation

## 2016-05-15 DIAGNOSIS — K589 Irritable bowel syndrome without diarrhea: Secondary | ICD-10-CM | POA: Insufficient documentation

## 2016-05-15 DIAGNOSIS — K219 Gastro-esophageal reflux disease without esophagitis: Secondary | ICD-10-CM | POA: Diagnosis not present

## 2016-05-15 DIAGNOSIS — E119 Type 2 diabetes mellitus without complications: Secondary | ICD-10-CM | POA: Diagnosis not present

## 2016-05-15 DIAGNOSIS — Z79811 Long term (current) use of aromatase inhibitors: Secondary | ICD-10-CM | POA: Insufficient documentation

## 2016-05-15 DIAGNOSIS — Z7984 Long term (current) use of oral hypoglycemic drugs: Secondary | ICD-10-CM | POA: Insufficient documentation

## 2016-05-15 DIAGNOSIS — E785 Hyperlipidemia, unspecified: Secondary | ICD-10-CM | POA: Insufficient documentation

## 2016-05-15 DIAGNOSIS — Z9012 Acquired absence of left breast and nipple: Secondary | ICD-10-CM | POA: Diagnosis not present

## 2016-05-15 DIAGNOSIS — D0512 Intraductal carcinoma in situ of left breast: Secondary | ICD-10-CM | POA: Diagnosis not present

## 2016-05-15 DIAGNOSIS — D696 Thrombocytopenia, unspecified: Secondary | ICD-10-CM

## 2016-05-15 LAB — COMPREHENSIVE METABOLIC PANEL
ALT: 26 U/L (ref 14–54)
AST: 45 U/L — ABNORMAL HIGH (ref 15–41)
Albumin: 3.9 g/dL (ref 3.5–5.0)
Alkaline Phosphatase: 57 U/L (ref 38–126)
Anion gap: 6 (ref 5–15)
BUN: 11 mg/dL (ref 6–20)
CO2: 28 mmol/L (ref 22–32)
Calcium: 9.4 mg/dL (ref 8.9–10.3)
Chloride: 104 mmol/L (ref 101–111)
Creatinine, Ser: 0.61 mg/dL (ref 0.44–1.00)
GFR calc Af Amer: >60 mL/min (ref >60)
GFR calc non Af Amer: >60 mL/min (ref >60)
Glucose, Bld: 242 mg/dL — ABNORMAL HIGH (ref 65–99)
Potassium: 3.9 mmol/L (ref 3.5–5.1)
Sodium: 138 mmol/L (ref 135–145)
Total Bilirubin: 0.5 mg/dL (ref 0.3–1.2)
Total Protein: 7.2 g/dL (ref 6.5–8.1)

## 2016-05-15 LAB — CBC WITH DIFFERENTIAL/PLATELET
Basophils Absolute: 0 10*3/uL (ref 0–0.1)
Basophils Relative: 1 %
Eosinophils Absolute: 0.1 10*3/uL (ref 0–0.7)
Eosinophils Relative: 3 %
HCT: 32.4 % — ABNORMAL LOW (ref 35.0–47.0)
Hemoglobin: 11 g/dL — ABNORMAL LOW (ref 12.0–16.0)
Lymphocytes Relative: 37 %
Lymphs Abs: 1.1 10*3/uL (ref 1.0–3.6)
MCH: 30.2 pg (ref 26.0–34.0)
MCHC: 34 g/dL (ref 32.0–36.0)
MCV: 88.8 fL (ref 80.0–100.0)
Monocytes Absolute: 0.3 10*3/uL (ref 0.2–0.9)
Monocytes Relative: 9 %
Neutro Abs: 1.5 10*3/uL (ref 1.4–6.5)
Neutrophils Relative %: 50 %
Platelets: 105 10*3/uL — ABNORMAL LOW (ref 150–440)
RBC: 3.65 MIL/uL — ABNORMAL LOW (ref 3.80–5.20)
RDW: 13.8 % (ref 11.5–14.5)
WBC: 3 10*3/uL — ABNORMAL LOW (ref 3.6–11.0)

## 2016-05-15 NOTE — Progress Notes (Signed)
Patient states she has discovered an area in her left breast.  She is scheduled for for mammogram and [place] tomorrow @ [place].

## 2016-05-15 NOTE — Progress Notes (Signed)
[facility] NOTE  Patient Care Team: Albina Billet, MD as PCP - General (Internal Medicine)  CHIEF COMPLAINTS/PURPOSE OF CONSULTATION:   Oncology History   # AUG 2017- DCIS LEFT BREAST ER/PR- POS; positive margins [Dr.Smith]; s/p re-excision; s/p RT [finish RT Nov 10th 2017]; START ARIMIDEX- Jan 2018  # HRT [clinical trial thru [place]; stopped July 2017 ]; CPAP      Ductal carcinoma in situ (DCIS) of left breast   10/12/2015 Initial Diagnosis    Ductal carcinoma in situ (DCIS) of left breast       Oncology History   # AUG 2017- DCIS LEFT BREAST ER/PR- POS; positive margins [Dr.Smith]; s/p re-excision; s/p RT [finish RT Nov 10th 2017]; START ARIMIDEX- Jan 2018  # HRT [clinical trial thru [place]; stopped July 2017 ]; CPAP      Ductal carcinoma in situ (DCIS) of left breast   10/12/2015 Initial Diagnosis    Ductal carcinoma in situ (DCIS) of left breast       HISTORY OF PRESENTING ILLNESS:  Annette Foster 76 y.o.  female pleasant patient with diagnosed left breast DCIS in August 2017 s/p Radiation. Patient currently on Arimidex.  Patient states she noted to have "lump in the left breast" are on the site of incision noted approximately few weeks ago. She has been evaluated by Dr. Tamala Julian; awaiting mammogram and ultrasound tomorrow.   Appetite is good. No weight loss. No headaches. Patient is anxious.  ROS: A complete 10 point review of system is done which is negative except mentioned above in history of present illness  MEDICAL HISTORY:  Past Medical History:  Diagnosis Date  . Abdominal pain   . Allergy   . Asthma   . Cancer [facility]) 2017   breast cancer  . Cataract   . CHF (congestive heart failure) ([place])   . Collagenous colitis   . Diabetes mellitus without complication ([place])   . Diarrhea   . Diverticulosis   . Fibrocystic breast   . GERD (gastroesophageal reflux disease)   . Heart murmur   . Hyperlipidemia   . Hypertension   . Hypothyroidism   .  IBS (irritable bowel syndrome)   . IBS (irritable bowel syndrome)   . IDA (iron deficiency anemia)   . PONV (postoperative nausea and vomiting)   . Seizures ([place])   . Sleep apnea     SURGICAL HISTORY: Past Surgical History:  Procedure Laterality Date  . ABDOMINAL HYSTERECTOMY     tah bso  . ABDOMINAL SURGERY    . APPENDECTOMY    . BREAST BIOPSY Bilateral    negative  . BREAST 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/2015   stereo pending  . CARDIAC SURGERY    . CATARACT EXTRACTION Right   . CHOLECYSTECTOMY    . COLONOSCOPY WITH PROPOFOL N/A 03/11/2016   Procedure: COLONOSCOPY WITH PROPOFOL;  Surgeon: Manya Silvas, MD;  Location: [facility] ENDOSCOPY;  Service: Endoscopy;  Laterality: N/A;  . ESOPHAGOGASTRODUODENOSCOPY (EGD) WITH PROPOFOL N/A 03/11/2016   Procedure: ESOPHAGOGASTRODUODENOSCOPY (EGD) WITH PROPOFOL;  Surgeon: Manya Silvas, MD;  Location: [facility] ENDOSCOPY;  Service: Endoscopy;  Laterality: N/A;  . JOINT REPLACEMENT Left    TKR  . left sinusplasty     . MASTECTOMY, PARTIAL Left 10/17/2015   Procedure: MASTECTOMY PARTIAL REVISION;  Surgeon: Leonie Green, MD;  Location: [facility] ORS;  Service: General;  Laterality: Left;  . PARTIAL MASTECTOMY WITH NEEDLE LOCALIZATION Left 09/29/2015   Procedure: PARTIAL MASTECTOMY WITH NEEDLE LOCALIZATION;  Surgeon:  Leonie Green, MD;  Location: [facility] ORS;  Service: General;  Laterality: Left;  . 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  Social History   Social History  . Marital status: Married    Spouse name: N/A  . Number of children: N/A  . Years of education: N/A   Occupational History  . Not on file.   Social History Main Topics  . Smoking status: Never Smoker  . Smokeless tobacco: Never Used  . Alcohol use No  . Drug use: No  . Sexual activity: Not Currently    Birth control/ protection: Surgical   Other Topics Concern  . Not on file    Social History Narrative  . No narrative on file    FAMILY HISTORY: Family History  Problem Relation Age of Onset  . Breast cancer Paternal Grandmother   . Colon cancer Father   . Diabetes Sister   . Diabetes Brother   . Heart disease Brother   . Colon cancer Maternal Uncle   . Ovarian cancer Neg Hx     ALLERGIES:  is allergic to fentanyl; codeine; demerol [meperidine]; hydrocodone-acetaminophen; other; oxycodone; pentazocine; and tetracyclines & related.  MEDICATIONS:  Current Outpatient Prescriptions  Medication Sig Dispense Refill  . anastrozole (ARIMIDEX) 1 MG tablet Take 1 tablet (1 mg total) by mouth daily. 90 tablet 3  . aspirin 81 MG tablet Take 81 mg by mouth at bedtime.    . benzonatate (TESSALON) 200 MG capsule Take 200 mg by mouth 3 (three) times daily as needed for cough.    . budesonide (ENTOCORT EC) 3 MG 24 hr capsule Take 9 mg qd for a month,  6 mg daily for a month, and then 6 mg every other day to follow.    . budesonide-formoterol (SYMBICORT) 80-4.5 MCG/ACT inhaler Inhale 2 puffs into the lungs 2 (two) times daily.    . cholecalciferol (VITAMIN D) 1000 units tablet Take 2,000 Units by mouth daily.     . clobetasol ointment (TEMOVATE) 0.09 % Apply 1 application topically 2 (two) times daily. 30 g 1  . colestipol (COLESTID) 1 g tablet Take 2 g by mouth daily.     . Cyanocobalamin (B-12 COMPLIANCE INJECTION) 1000 MCG/ML KIT Inject 1 Syringe as directed every 30 (thirty) days.    Marland Kitchen escitalopram (LEXAPRO) 5 MG tablet Take 5 mg by mouth at bedtime.    Marland Kitchen esomeprazole (NEXIUM) 40 MG capsule Take by mouth.    . fenofibrate 160 MG tablet Take 160 mg by mouth at bedtime.    . fexofenadine (ALLEGRA) 180 MG tablet Take 180 mg by mouth at bedtime.     . fluticasone (FLONASE) 50 MCG/ACT nasal spray Place 1 spray into the nose daily as needed for allergies.     . furosemide (LASIX) 20 MG tablet Take 20-40 mg by mouth every other day. Alternate between 1 a day and 2 a day     . glimepiride (AMARYL) 2 MG tablet Take 2 mg by mouth at bedtime.    Marland Kitchen Hyoscyamine Sulfate 0.375 MG TBCR Take 0.375 mg by mouth 2 (two) times daily.     . lansoprazole (PREVACID) 30 MG capsule Take 30 mg by mouth daily as needed (reflux).     Marland Kitchen levothyroxine (SYNTHROID, LEVOTHROID) 75 MCG tablet Take 75 mcg by mouth daily before breakfast.     . magnesium oxide (MAG-OX) 400 MG tablet Take by mouth.    Marland Kitchen  metFORMIN (GLUCOPHAGE-XR) 750 MG 24 hr tablet Take 750 mg by mouth 2 (two) times daily.     . montelukast (SINGULAIR) 10 MG tablet Take 10 mg by mouth at bedtime.     Marland Kitchen nystatin cream (MYCOSTATIN) Apply topically.    Marland Kitchen olopatadine (PATANOL) 0.1 % ophthalmic solution 1 drop 2 (two) times daily.    . potassium chloride SA (K-DUR,KLOR-CON) 20 MEQ tablet Take by mouth.    . saccharomyces boulardii (FLORASTOR) 250 MG capsule Take 250 mg by mouth 2 (two) times daily.     . simvastatin (ZOCOR) 20 MG tablet Take 20 mg by mouth at bedtime.    . valsartan (DIOVAN) 160 MG tablet Take 160 mg by mouth every morning.      No current facility-administered medications for this visit.       Marland Kitchen  PHYSICAL EXAMINATION: ECOG PERFORMANCE STATUS: 0 - Asymptomatic  Vitals:   05/15/16 1416  BP: 127/60  Pulse: 64  Resp: 18  Temp: 97.7 F (36.5 C)   Filed Weights   05/15/16 1416  Weight: 181 lb 8 oz (82.3 kg)    GENERAL: Well-nourished well-developed; Alert, no distress and comfortable.  Alone.  EYES: no pallor or icterus OROPHARYNX: no thrush or ulceration; good dentition  NECK: supple, no masses felt LYMPH:  no palpable lymphadenopathy in the cervical, axillary or inguinal regions LUNGS: clear to auscultation and  No wheeze or crackles HEART/CVS: regular rate & rhythm and no murmurs; No lower extremity edema ABDOMEN: abdomen soft, tender in LLQ with normal bowel sounds Musculoskeletal:no cyanosis of digits and no clubbing  PSYCH: alert & oriented x 3 with fluent speech NEURO: no focal  motor/sensory deficits SKIN:  no rashes or significant lesions Right and left BREAST exam [in the presence of nurse]- no unusual skin changes or dominant masses felt. Surgical scars noted. ? Thickening under the skin incision.    LABORATORY DATA:  I have reviewed the data as listed Lab Results  Component Value Date   WBC 3.0 (L) 05/15/2016   HGB 11.0 (L) 05/15/2016   HCT 32.4 (L) 05/15/2016   MCV 88.8 05/15/2016   PLT 105 (L) 05/15/2016    Recent Labs  02/15/16 0956 05/15/16 1340  NA 138 138  K 3.8 3.9  CL 102 104  CO2 29 28  GLUCOSE 137* 242*  BUN 13 11  CREATININE 0.78 0.61  CALCIUM 8.7* 9.4  GFRNONAA >60 >60  GFRAA >60 >60  PROT 6.9 7.2  ALBUMIN 3.7 3.9  AST 71* 45*  ALT 45 26  ALKPHOS 54 57  BILITOT 0.5 0.5    RADIOGRAPHIC STUDIES: I have personally reviewed the radiological images as listed and agreed with the findings in the report. No results found.  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 On arimidex.  # ? Lump in left breast- ? Radiation/surgical changes; awaiting mammo/US tomorrow [dr.Smith].   # Discussed osteoporosis; Dexa scan completed 02/08/16: T-score 0.5 (normal). Will repeat in one year while on Arimidex.   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 Educated patient on signs and symptoms of low platelets.   # Elevated Blood sugars/DM-type 2- 242/ better at home  # MD visit in 3 months/CBC.      Cammie Sickle, MD 05/15/2016 4:03 PM

## 2016-05-15 NOTE — Assessment & Plan Note (Addendum)
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 On arimidex.  # ? Lump in left breast- ? Radiation/surgical changes; awaiting mammo/US tomorrow [dr.Smith].   # Discussed osteoporosis; Dexa scan completed 02/08/16: T-score 0.5 (normal). Will repeat in one year while on Arimidex.   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 Educated patient on signs and symptoms of low platelets.   # Elevated Blood sugars/DM-type 2- 242/ better at home  # MD visit in 3 months/CBC.

## 2016-05-16 ENCOUNTER — Ambulatory Visit
Admission: RE | Admit: 2016-05-16 | Discharge: 2016-05-16 | Disposition: A | Discharge status: Home / Self Care | Payer: Medicare Other | Source: Ambulatory Visit | Attending: Surgery | Admitting: Surgery

## 2016-05-16 DIAGNOSIS — N63 Unspecified lump in unspecified breast: Secondary | ICD-10-CM

## 2016-05-16 DIAGNOSIS — N6320 Unspecified lump in the left breast, unspecified quadrant: Secondary | ICD-10-CM | POA: Insufficient documentation

## 2016-05-16 HISTORY — DX: Personal history of irradiation: Z92.3

## 2016-05-20 ENCOUNTER — Other Ambulatory Visit: Payer: Self-pay | Admitting: Surgery

## 2016-05-20 DIAGNOSIS — R928 Other abnormal and inconclusive findings on diagnostic imaging of breast: Secondary | ICD-10-CM

## 2016-05-20 DIAGNOSIS — R921 Mammographic calcification found on diagnostic imaging of breast: Secondary | ICD-10-CM

## 2016-05-27 ENCOUNTER — Ambulatory Visit
Admission: RE | Admit: 2016-05-27 | Discharge: 2016-05-27 | Disposition: A | Discharge status: Home / Self Care | Payer: Medicare Other | Source: Ambulatory Visit | Attending: Surgery | Admitting: Surgery

## 2016-05-27 DIAGNOSIS — R928 Other abnormal and inconclusive findings on diagnostic imaging of breast: Secondary | ICD-10-CM

## 2016-05-27 DIAGNOSIS — R921 Mammographic calcification found on diagnostic imaging of breast: Secondary | ICD-10-CM | POA: Diagnosis not present

## 2016-05-27 HISTORY — PX: BREAST BIOPSY: SHX20

## 2016-05-29 LAB — SURGICAL PATHOLOGY

## 2016-06-19 ENCOUNTER — Encounter: Payer: Self-pay | Admitting: Radiation Oncology

## 2016-06-19 ENCOUNTER — Ambulatory Visit
Admission: RE | Admit: 2016-06-19 | Discharge: 2016-06-19 | Disposition: A | Discharge status: Home / Self Care | Payer: Medicare Other | Source: Ambulatory Visit | Attending: Radiation Oncology | Admitting: Radiation Oncology

## 2016-06-19 VITALS — BP 136/71 | HR 59 | Temp 98.2°F | Resp 18 | Wt 175.2 lb

## 2016-06-19 DIAGNOSIS — Z79811 Long term (current) use of aromatase inhibitors: Secondary | ICD-10-CM | POA: Insufficient documentation

## 2016-06-19 DIAGNOSIS — D0512 Intraductal carcinoma in situ of left breast: Secondary | ICD-10-CM

## 2016-06-19 DIAGNOSIS — Z170 Estrogen receptor positive status [ER+]: Secondary | ICD-10-CM | POA: Insufficient documentation

## 2016-06-19 DIAGNOSIS — Z923 Personal history of irradiation: Secondary | ICD-10-CM | POA: Diagnosis not present

## 2016-06-19 NOTE — Progress Notes (Signed)
Radiation Oncology Follow up Note  Name: Annette Foster   Date:   06/19/2016 MRN:  [id number] DOB: [birth year]    This 76 y.o. female presents to the clinic today for six-month follow-up status post whole breast radiation to her left breast for ductal carcinoma in situ  REFERRING PROVIDER: Albina Billet, MD  HPI: patient is a 76 year old female now out 6 months having completed radiation therapy to her left breast for ductal carcinoma in situ status post wide local excision..she is seen today in routine follow-up and is doing well. She specifically denies breast tenderness cough or bone pain.she's currently on arimadex tolerating that well without side effect.she did have follow-up mammogram and ultrasound showing coarse heterogeneous calcifications which were biopsied 416 and were benign.  COMPLICATIONS OF TREATMENT: none  FOLLOW UP COMPLIANCE: keeps appointments   PHYSICAL EXAM:  BP 136/71   Pulse (!) 59   Temp 98.2 F (36.8 C)   Resp 18   Wt 175 lb 2.5 oz (79.5 kg)   LMP  (LMP Unknown)   BMI 31.03 kg/m  Lungs are clear to A&P cardiac examination essentially unremarkable with regular rate and rhythm. No dominant mass or nodularity is noted in either breast in 2 positions examined. Incision is well-healed. No axillary or supraclavicular adenopathy is appreciated. Cosmetic result is excellent. Well-developed well-nourished patient in NAD. HEENT reveals PERLA, EOMI, discs not visualized.  Oral cavity is clear. No oral mucosal lesions are identified. Neck is clear without evidence of cervical or supraclavicular adenopathy. Lungs are clear to A&P. Cardiac examination is essentially unremarkable with regular rate and rhythm without murmur rub or thrill. Abdomen is benign with no organomegaly or masses noted. Motor sensory and DTR levels are equal and symmetric in the upper and lower extremities. Cranial nerves II through XII are grossly intact. Proprioception is intact. No peripheral adenopathy or  edema is identified. No motor or sensory levels are noted. Crude visual fields are within normal range.  RADIOLOGY RESULTS: mammograms and ultrasound reviewed and compatible above-stated findings  PLAN: present time she continues to do well with no evidence of disease. I am please were overall progress. I've asked to see her back in 6 months for follow-up and then will start once your follow-up visits. Patient knows to call sooner with any concerns.  I would like to take this opportunity to thank you for allowing me to participate in the care of your patient.Armstead Peaks., MD

## 2016-06-20 ENCOUNTER — Encounter: Payer: Self-pay | Admitting: Obstetrics and Gynecology

## 2016-06-20 ENCOUNTER — Ambulatory Visit (INDEPENDENT_AMBULATORY_CARE_PROVIDER_SITE_OTHER): Payer: Medicare Other | Admitting: Obstetrics and Gynecology

## 2016-06-20 VITALS — BP 138/62 | HR 69 | Ht 63.0 in | Wt 176.2 lb

## 2016-06-20 DIAGNOSIS — Z8742 Personal history of other diseases of the female genital tract: Secondary | ICD-10-CM | POA: Insufficient documentation

## 2016-06-20 DIAGNOSIS — N763 Subacute and chronic vulvitis: Secondary | ICD-10-CM | POA: Diagnosis not present

## 2016-06-20 DIAGNOSIS — N952 Postmenopausal atrophic vaginitis: Secondary | ICD-10-CM | POA: Diagnosis not present

## 2016-06-20 DIAGNOSIS — Z90710 Acquired absence of both cervix and uterus: Secondary | ICD-10-CM | POA: Diagnosis not present

## 2016-06-20 DIAGNOSIS — N904 Leukoplakia of vulva: Secondary | ICD-10-CM | POA: Insufficient documentation

## 2016-06-20 NOTE — Patient Instructions (Signed)
1. Decreased Temovate ointment application to every other day dosing to the vulva 2. Return in 2 weeks for perianal biopsy to rule out lichen sclerosus

## 2016-06-20 NOTE — Progress Notes (Signed)
GYN ENCOUNTER NOTE  Subjective:       Annette Foster is a 76 y.o. G12P2002 female is here for gynecologic evaluation of the following issues:  1. Chronic Vulvitis 2. Abnormal Bleeding 3. History of breast cancer  Patient presents for follow up of lichen sclerosis. States that she has been using the Temovate ointment daily and that her symptoms have improved with only 1-2 days of itching/burning over the last 6 weeks. She did notice on 5/4 that she had blood in the toilet 3 times but is unaware of the source. She has had a hysterectomy many years ago and denies any bleeding until this episode. She denies pelvic and vaginal pain, recent trauma to the area, any other vaginal  Discharge, and dysuria.  Patient has a history of breast cancer and what seemed to be a recurrence, but this was determined to me a calcification and is being closely followed by her oncologist every 2 months.  Patient is status post abdominal hysterectomy.   Gynecologic History No LMP recorded (lmp unknown). Patient has had a hysterectomy.  Contraception: status post hysterectomy  Obstetric History OB History  Gravida Para Term Preterm AB Living  2 2 2     2   SAB TAB Ectopic Multiple Live Births          2    # Outcome Date GA Lbr Len/2nd Weight Sex Delivery Anes PTL Lv  2 Term 1966    F Vag-Spont   LIV  1 Term 1963    F Vag-Spont   LIV      Past Medical History:  Diagnosis Date  . Abdominal pain   . Allergy   . Asthma   . Cancer [facility]) 2017   breast cancer- Left  . Cataract   . CHF (congestive heart failure) ([place])   . Collagenous colitis   . Diabetes mellitus without complication ([place])   . Diarrhea   . Diverticulosis   . Fibrocystic breast   . GERD (gastroesophageal reflux disease)   . Heart murmur   . Hyperlipidemia   . Hypertension   . Hypothyroidism   . IBS (irritable bowel syndrome)   . IBS (irritable bowel syndrome)   . IDA (iron deficiency anemia)   . Personal history of radiation therapy    . PONV (postoperative nausea and vomiting)   . Seizures ([place])   . Sleep apnea     Past Surgical History:  Procedure Laterality Date  . ABDOMINAL HYSTERECTOMY     [facility] bso  . ABDOMINAL SURGERY    . APPENDECTOMY    . BREAST BIOPSY Bilateral    negative  . 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 BREAST BIOPSY Left 05/27/2016   path pending  . CARDIAC SURGERY    . CATARACT EXTRACTION Right   . CHOLECYSTECTOMY    . COLONOSCOPY WITH PROPOFOL N/A 03/11/2016   Procedure: COLONOSCOPY WITH PROPOFOL;  Surgeon: Manya Silvas, MD;  Location: [facility] ENDOSCOPY;  Service: Endoscopy;  Laterality: N/A;  . ESOPHAGOGASTRODUODENOSCOPY (EGD) WITH PROPOFOL N/A 03/11/2016   Procedure: ESOPHAGOGASTRODUODENOSCOPY (EGD) WITH PROPOFOL;  Surgeon: Manya Silvas, MD;  Location: [facility] ENDOSCOPY;  Service: Endoscopy;  Laterality: N/A;  . JOINT REPLACEMENT Left    TKR  . left sinusplasty     . MASTECTOMY, PARTIAL Left 10/17/2015   Procedure: MASTECTOMY PARTIAL REVISION;  Surgeon: Leonie Green, MD;  Location: [facility] ORS;  Service: General;  Laterality: Left;  . PARTIAL MASTECTOMY WITH NEEDLE  LOCALIZATION Left 09/29/2015   Procedure: PARTIAL MASTECTOMY WITH NEEDLE LOCALIZATION;  Surgeon: Leonie Green, MD;  Location: [facility] ORS;  Service: General;  Laterality: Left;  . TOTAL ABDOMINAL HYSTERECTOMY W/ BILATERAL SALPINGOOPHORECTOMY      Current Outpatient Prescriptions on File Prior to Visit  Medication Sig Dispense Refill  . anastrozole (ARIMIDEX) 1 MG tablet Take 1 tablet (1 mg total) by mouth daily. 90 tablet 3  . aspirin 81 MG tablet Take 81 mg by mouth at bedtime.    . benzonatate (TESSALON) 200 MG capsule Take 200 mg by mouth 3 (three) times daily as needed for cough.    . budesonide (ENTOCORT EC) 3 MG 24 hr capsule Take 9 mg qd for a month,  6 mg daily for a month, and then 6 mg every other day to follow.    . budesonide-formoterol (SYMBICORT) 80-4.5 MCG/ACT inhaler  Inhale 2 puffs into the lungs 2 (two) times daily.    . cholecalciferol (VITAMIN D) 1000 units tablet Take 2,000 Units by mouth daily.     . clobetasol ointment (TEMOVATE) 3.66 % Apply 1 application topically 2 (two) times daily. 30 g 1  . Cyanocobalamin (B-12 COMPLIANCE INJECTION) 1000 MCG/ML KIT Inject 1 Syringe as directed every 30 (thirty) days.    Marland Kitchen escitalopram (LEXAPRO) 5 MG tablet Take 5 mg by mouth at bedtime.    Marland Kitchen esomeprazole (NEXIUM) 40 MG capsule Take by mouth.    . fenofibrate 160 MG tablet Take 160 mg by mouth at bedtime.    . fexofenadine (ALLEGRA) 180 MG tablet Take 180 mg by mouth at bedtime.     . fluticasone (FLONASE) 50 MCG/ACT nasal spray Place 1 spray into the nose daily as needed for allergies.     . furosemide (LASIX) 20 MG tablet Take 20-40 mg by mouth every other day. Alternate between 1 a day and 2 a day    . glimepiride (AMARYL) 2 MG tablet Take 2 mg by mouth at bedtime.    Marland Kitchen Hyoscyamine Sulfate 0.375 MG TBCR Take 0.375 mg by mouth 2 (two) times daily.     . lansoprazole (PREVACID) 30 MG capsule Take 30 mg by mouth daily as needed (reflux).     Marland Kitchen levothyroxine (SYNTHROID, LEVOTHROID) 75 MCG tablet Take 75 mcg by mouth daily before breakfast.     . magnesium oxide (MAG-OX) 400 MG tablet Take by mouth.    . metFORMIN (GLUCOPHAGE-XR) 750 MG 24 hr tablet Take 750 mg by mouth 2 (two) times daily.     . montelukast (SINGULAIR) 10 MG tablet Take 10 mg by mouth at bedtime.     Marland Kitchen nystatin cream (MYCOSTATIN) Apply topically.    Marland Kitchen olopatadine (PATANOL) 0.1 % ophthalmic solution 1 drop 2 (two) times daily.    . potassium chloride SA (K-DUR,KLOR-CON) 20 MEQ tablet Take by mouth.    . saccharomyces boulardii (FLORASTOR) 250 MG capsule Take 250 mg by mouth 2 (two) times daily.     . simvastatin (ZOCOR) 20 MG tablet Take 20 mg by mouth at bedtime.    . valsartan (DIOVAN) 160 MG tablet Take 160 mg by mouth every morning.      No current facility-administered medications on file  prior to visit.     Allergies  Allergen Reactions  . Fentanyl Shortness Of Breath, Itching and Nausea And Vomiting  . Codeine Itching and Nausea And Vomiting  . Demerol [Meperidine] Itching and Nausea And Vomiting  . Hydrocodone-Acetaminophen Itching and Nausea And Vomiting  .  Other Other (See Comments)  . Oxycodone Itching and Nausea And Vomiting  . Pentazocine Other (See Comments)  . Tetracyclines & Related Rash    Social History   Social History  . Marital status: Married    Spouse name: N/A  . Number of children: N/A  . Years of education: N/A   Occupational History  . Not on file.   Social History Main Topics  . Smoking status: Never Smoker  . Smokeless tobacco: Never Used  . Alcohol use No  . Drug use: No  . Sexual activity: Not Currently    Birth control/ protection: Surgical   Other Topics Concern  . Not on file   Social History Narrative  . No narrative on file    Family History  Problem Relation Age of Onset  . Breast cancer Paternal Grandmother   . Colon cancer Father   . Diabetes Sister   . Diabetes Brother   . Heart disease Brother   . Colon cancer Maternal Uncle   . Ovarian cancer Neg Hx     The following portions of the patient's history were reviewed and updated as appropriate: allergies, current medications, past family history, past medical history, past social history, past surgical history and problem list.  Review of Systems Review of Systems - General ROS: negative for - chills, fatigue, fever Gastrointestinal ROS: negative for - abdominal pain, blood in stools, change in bowel habits  Genito-Urinary ROS: positive for- occasional vulvar itching and burning; negative for - dysuria  Objective:   BP 138/62   Pulse 69   Ht 5' 3"  (1.6 m)   Wt 176 lb 3.2 oz (79.9 kg)   LMP  (LMP Unknown)   BMI 31.21 kg/m  CONSTITUTIONAL: Well-developed, well-nourished female in no acute distress.  [place]: Alert. PSYCHIATRIC: Normal mood and  affect. Normal behavior. Normal judgment and thought content. PELVIC:  External Genitalia: hyperemia of the vulva, Symmetric (labia majora)  BUS: Normal  Vagina: Atrophic, no lesions or identifiable source of reported bleeding  Cervix: surgically absent  Uterus: surgically absent  Adnexa: surgically absent  Bimanual: No palpable vaginal mass or tenderness  RV: increased hyperemia and leukoplakia perianally, worse on the left.   Bladder: Nontender   Assessment:   1. Chronic vulvitis Improving, using Temovate ointment  2. 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 H/O vaginal bleeding No identifiable source on pelvic exam. Vagina looks atrophic, but has no lesions.  4. Vaginal atrophy   5. Status post hysterectomy  Plan:   1. Decrease Temovate ointment to every other day dosing to the vulva 2. Return in 2 weeks for perianal biopsy to rule out lichen sclerosis.   A total of 15 minutes were spent face-to-face with the patient during this encounter and over half of that time dealt with counseling and coordination of care.  Aliene Altes, PA-S [place] 06/20/2016  Brayton Mars, MD   I have seen, interviewed, and examined the patient in conjunction with the [facility].A. student and affirm the diagnosis and management plan. Braelynne Garinger A. Cindy Fullman, MD, FACOG   Note: This dictation was prepared with Dragon dictation along with smaller phrase technology. Any transcriptional errors that result from this process are unintentional.

## 2016-07-01 ENCOUNTER — Ambulatory Visit: Admitting: Podiatry

## 2016-07-04 ENCOUNTER — Ambulatory Visit (INDEPENDENT_AMBULATORY_CARE_PROVIDER_SITE_OTHER): Payer: Medicare Other | Admitting: Obstetrics and Gynecology

## 2016-07-04 ENCOUNTER — Encounter: Payer: Self-pay | Admitting: Obstetrics and Gynecology

## 2016-07-04 VITALS — BP 153/69 | HR 70 | Ht 63.0 in | Wt 168.1 lb

## 2016-07-04 DIAGNOSIS — R300 Dysuria: Secondary | ICD-10-CM

## 2016-07-04 DIAGNOSIS — N904 Leukoplakia of vulva: Secondary | ICD-10-CM

## 2016-07-04 DIAGNOSIS — N763 Subacute and chronic vulvitis: Secondary | ICD-10-CM

## 2016-07-04 DIAGNOSIS — R3915 Urgency of urination: Secondary | ICD-10-CM

## 2016-07-04 LAB — POCT URINALYSIS DIPSTICK
Blood, UA: NEGATIVE
Glucose, UA: NEGATIVE
Ketones, UA: NEGATIVE
Leukocytes, UA: NEGATIVE
Nitrite, UA: NEGATIVE
Spec Grav, UA: >=1.03 — AB (ref 1.010–1.025)
Urobilinogen, UA: NEGATIVE E.U./dL — AB
pH, UA: 6 (ref 5.0–8.0)

## 2016-07-04 MED ORDER — CLOBETASOL PROPIONATE 0.05 % EX OINT: 1.0000 "application " | TOPICAL_OINTMENT | Freq: Two times a day (BID) | CUTANEOUS | 1 refills | Status: DC

## 2016-07-04 NOTE — Patient Instructions (Signed)
1. Biopsy 2 is taken 2. Return in 1 week for follow-up  VULVAR BIOPSY POST-PROCEDURE INSTRUCTIONS  1. You may take Ibuprofen, Aleve or Tylenol for pain if needed.    2. You may have a small amount of spotting.  You should wear a mini pad for the next few days.  3. You may use some topical Neosporin ointment if you would like (over the counter is fine).  4. You need to call if you have redness around the biopsy site, if there is any unusual draining, if the bleeding is heavy, or if you are concerned.  5. Shower or bathe as normal  6. We will discuss the results at your follow-up appointment in 1 week.

## 2016-07-04 NOTE — Addendum Note (Signed)
Addended by: Elouise Munroe on: 07/04/2016 02:20 PM   Modules accepted: Orders

## 2016-07-04 NOTE — Progress Notes (Signed)
Chief complaint: 1. Vulvar biopsy  Patient presents for vulvar biopsy due to chronic skin changes noted in the perianal region despite use of Temovate ointment for chronic vulvitis and suspected lichen sclerosus.  PROCEDURE: Vulvar biopsy 2 Normal consent is obtained. Patient was placed in the dorsal lithotomy position. The perianal region is cleansed with Betadine loss. 4 cc of 1% lidocaine without epinephrine injected locally for anesthetic purposes. 3.5 mm punch biopsy is taken on the right and left sides of the perianal region identified as abnormal (symmetric hyperemia). Simple interrupted 3-0 Vicryl repeat sutures are placed for hemostasis. Blood loss is minimal. Procedure was well-tolerated. Complications are none.  ASSESSMENT: 1. History of chronic vulvitis, improving with Temovate ointment therapy 2. Perianal leukoplakia and hyperemia, cannot rule out lichen sclerosus  PLAN: 1. Perianal biopsy 2 2. Return in 1 week for follow-up and further management planning 3. Post procedure instructions were given  Brayton Mars, MD  Note: This dictation was prepared with Dragon dictation along with smaller phrase technology. Any transcriptional errors that result from this process are unintentional.

## 2016-07-06 LAB — URINE CULTURE

## 2016-07-09 ENCOUNTER — Telehealth: Payer: Self-pay

## 2016-07-09 ENCOUNTER — Other Ambulatory Visit: Payer: Self-pay

## 2016-07-09 MED ORDER — AMOXICILLIN 500 MG PO CAPS: 500.0000 mg | ORAL_CAPSULE | Freq: Two times a day (BID) | ORAL | 0 refills | Status: DC

## 2016-07-09 NOTE — Telephone Encounter (Signed)
-----   Message from Brayton Mars, MD sent at 07/07/2016 10:35 AM EDT ----- Please notify - Abnormal Labs Call in Amoxacillin 500 mg twice daily x 7 days Thanks

## 2016-07-09 NOTE — Telephone Encounter (Signed)
Pt aware of uti- med erx.

## 2016-07-10 ENCOUNTER — Encounter: Payer: Medicare Other | Admitting: Obstetrics and Gynecology

## 2016-07-10 LAB — PATHOLOGY

## 2016-07-12 LAB — PATHOLOGY

## 2016-07-15 ENCOUNTER — Telehealth: Payer: Self-pay | Admitting: Obstetrics and Gynecology

## 2016-07-15 NOTE — Telephone Encounter (Signed)
Patient had biopsies and would like to know results and if she needs to schedule an appointment  Please call

## 2016-07-16 NOTE — Telephone Encounter (Signed)
pls review 2nd bx and I will contact pt. ty cm

## 2016-07-19 ENCOUNTER — Telehealth: Payer: Self-pay | Admitting: Obstetrics and Gynecology

## 2016-07-19 ENCOUNTER — Other Ambulatory Visit (INDEPENDENT_AMBULATORY_CARE_PROVIDER_SITE_OTHER): Payer: Medicare Other

## 2016-07-19 DIAGNOSIS — R309 Painful micturition, unspecified: Secondary | ICD-10-CM | POA: Diagnosis not present

## 2016-07-19 LAB — POCT URINALYSIS DIPSTICK
Blood, UA: NEGATIVE
Glucose, UA: NEGATIVE
Ketones, UA: NEGATIVE
Leukocytes, UA: NEGATIVE
Nitrite, UA: NEGATIVE
Protein, UA: NEGATIVE
Spec Grav, UA: >=1.03 — AB (ref 1.010–1.025)
Urobilinogen, UA: 0.2 E.U./dL
pH, UA: 6 (ref 5.0–8.0)

## 2016-07-19 MED ORDER — AMOXICILLIN 500 MG PO CAPS: 500.0000 mg | ORAL_CAPSULE | Freq: Two times a day (BID) | ORAL | 0 refills | Status: DC

## 2016-07-19 NOTE — Telephone Encounter (Signed)
Pt returned call. Per Artemio Aly pt will stop by office this afternoon for urine drop off. Will contact pt with results.

## 2016-07-19 NOTE — Telephone Encounter (Signed)
Pt aware per vm- u/a clear. CNS sent. Erx amoxicillin 500 bid x 7. Advised to push h20 and cranberry juice. Azo prn. Will contact with results.

## 2016-07-19 NOTE — Telephone Encounter (Signed)
Patient called stating she is still having UTI symptoms. She would like a call back. Thanks

## 2016-07-19 NOTE — Telephone Encounter (Signed)
[facility]c - need urine sample.

## 2016-07-21 LAB — URINE CULTURE

## 2016-07-26 ENCOUNTER — Telehealth: Payer: Self-pay | Admitting: Obstetrics and Gynecology

## 2016-07-26 NOTE — Telephone Encounter (Signed)
ERROR Scheduled patient for appt for Tues

## 2016-07-29 ENCOUNTER — Ambulatory Visit: Admitting: Podiatry

## 2016-07-30 ENCOUNTER — Encounter: Payer: Self-pay | Admitting: Obstetrics and Gynecology

## 2016-07-30 ENCOUNTER — Ambulatory Visit (INDEPENDENT_AMBULATORY_CARE_PROVIDER_SITE_OTHER): Payer: Medicare Other | Admitting: Obstetrics and Gynecology

## 2016-07-30 VITALS — BP 128/49 | HR 50 | Ht 63.0 in | Wt 177.6 lb

## 2016-07-30 DIAGNOSIS — N763 Subacute and chronic vulvitis: Secondary | ICD-10-CM

## 2016-07-30 DIAGNOSIS — L292 Pruritus vulvae: Secondary | ICD-10-CM | POA: Diagnosis not present

## 2016-07-30 DIAGNOSIS — R300 Dysuria: Secondary | ICD-10-CM | POA: Diagnosis not present

## 2016-07-30 DIAGNOSIS — R3915 Urgency of urination: Secondary | ICD-10-CM | POA: Diagnosis not present

## 2016-07-30 LAB — POCT URINALYSIS DIPSTICK
Bilirubin, UA: NEGATIVE
Blood, UA: NEGATIVE
Glucose, UA: NEGATIVE
Ketones, UA: NEGATIVE
Nitrite, UA: NEGATIVE
Protein, UA: NEGATIVE
Spec Grav, UA: <=1.005 — AB (ref 1.010–1.025)
Urobilinogen, UA: 0.2 E.U./dL
pH, UA: 7 (ref 5.0–8.0)

## 2016-07-30 NOTE — Patient Instructions (Signed)
1. Continue Temovate ointment 0.05% topically to the area anal region every other day 2. Return in 6 weeks for follow-up as scheduled 3. Referral to urology for evaluation of chronic dysuria/urgency

## 2016-07-30 NOTE — Progress Notes (Signed)
Chief complaint: 1. Chronic vulvitis 2. Perianal hyperemia 3. Dysuria and urgency, unclear etiology  Patient presents for follow-up after visit on 07/04/2016 where 2 peritoneal biopsies were performed in the perianal region. She is using Temovate ointment 0.05% topically every other day and does complain of the chronic itching/irritation. She is using [employer]. She is using Poise pads; other types of pads have been tried, unsuccessfully. Patient has been complaining of chronic dysuria and urgency. Last urine culture was negative; urine culture prior to the last culture was positive for group B strep which was treated.  Past medical history, past surgical history, problem list, medications, and allergies are reviewed  OBJECTIVE: BP (!) 128/49   Pulse (!) 50   Ht 5' 3"  (1.6 m)   Wt 177 lb 9.6 oz (80.6 kg)   LMP  (LMP Unknown)   BMI 31.46 kg/m  Pleasant elderly female in no acute distress Pelvic exam: External genitalia-vulvar hyperemia; patchy erythematous lesions noted on labia majora and mons pubis-psoriasiform like; perianal region notable for hyperemia and leukoplakia; biopsy sites are healed with residual suture present  ASSESSMENT: 1. Chronic vulvitis, unclear etiology 2. Perianal biopsies consistent with psoriasiform dermatitis 3. Chronic dysuria and urgency, unclear etiology  PLAN: 1. Continue with Temovate ointment 0.05% topically every other day to perineum 2. Urology consultation 3. Return in August 2018 as scheduled for follow-up  A total of 15 minutes were spent face-to-face with the patient during this encounter and over half of that time dealt with counseling and coordination of care.  Brayton Mars, MD  Note: This dictation was prepared with Dragon dictation along with smaller phrase technology. Any transcriptional errors that result from this process are unintentional.

## 2016-08-01 LAB — URINE CULTURE

## 2016-08-08 ENCOUNTER — Other Ambulatory Visit: Payer: Self-pay | Admitting: Nurse Practitioner

## 2016-08-08 DIAGNOSIS — R252 Cramp and spasm: Secondary | ICD-10-CM

## 2016-08-15 ENCOUNTER — Other Ambulatory Visit: Payer: Self-pay | Admitting: Nurse Practitioner

## 2016-08-15 ENCOUNTER — Ambulatory Visit
Admission: RE | Admit: 2016-08-15 | Discharge: 2016-08-15 | Disposition: A | Discharge status: Home / Self Care | Payer: Medicare Other | Source: Ambulatory Visit | Attending: Nurse Practitioner | Admitting: Nurse Practitioner

## 2016-08-15 DIAGNOSIS — R252 Cramp and spasm: Secondary | ICD-10-CM

## 2016-08-15 DIAGNOSIS — M2548 Effusion, other site: Secondary | ICD-10-CM | POA: Insufficient documentation

## 2016-08-16 ENCOUNTER — Inpatient Hospital Stay: Payer: Medicare Other | Attending: Internal Medicine | Admitting: Internal Medicine

## 2016-08-16 ENCOUNTER — Inpatient Hospital Stay: Payer: Medicare Other

## 2016-08-16 VITALS — BP 101/53 | HR 65 | Temp 98.0°F | Wt 172.1 lb

## 2016-08-16 DIAGNOSIS — D0512 Intraductal carcinoma in situ of left breast: Secondary | ICD-10-CM | POA: Diagnosis present

## 2016-08-16 DIAGNOSIS — K219 Gastro-esophageal reflux disease without esophagitis: Secondary | ICD-10-CM | POA: Insufficient documentation

## 2016-08-16 DIAGNOSIS — E785 Hyperlipidemia, unspecified: Secondary | ICD-10-CM | POA: Insufficient documentation

## 2016-08-16 DIAGNOSIS — D61818 Other pancytopenia: Secondary | ICD-10-CM | POA: Diagnosis not present

## 2016-08-16 DIAGNOSIS — Z9012 Acquired absence of left breast and nipple: Secondary | ICD-10-CM | POA: Diagnosis not present

## 2016-08-16 DIAGNOSIS — M791 Myalgia: Secondary | ICD-10-CM | POA: Insufficient documentation

## 2016-08-16 DIAGNOSIS — K589 Irritable bowel syndrome without diarrhea: Secondary | ICD-10-CM | POA: Diagnosis not present

## 2016-08-16 DIAGNOSIS — Z79811 Long term (current) use of aromatase inhibitors: Secondary | ICD-10-CM

## 2016-08-16 DIAGNOSIS — Z7982 Long term (current) use of aspirin: Secondary | ICD-10-CM | POA: Diagnosis not present

## 2016-08-16 DIAGNOSIS — Z7984 Long term (current) use of oral hypoglycemic drugs: Secondary | ICD-10-CM | POA: Diagnosis not present

## 2016-08-16 DIAGNOSIS — I509 Heart failure, unspecified: Secondary | ICD-10-CM | POA: Diagnosis not present

## 2016-08-16 DIAGNOSIS — Z170 Estrogen receptor positive status [ER+]: Secondary | ICD-10-CM | POA: Insufficient documentation

## 2016-08-16 DIAGNOSIS — E119 Type 2 diabetes mellitus without complications: Secondary | ICD-10-CM | POA: Insufficient documentation

## 2016-08-16 DIAGNOSIS — Z923 Personal history of irradiation: Secondary | ICD-10-CM

## 2016-08-16 DIAGNOSIS — D696 Thrombocytopenia, unspecified: Secondary | ICD-10-CM

## 2016-08-16 DIAGNOSIS — I10 Essential (primary) hypertension: Secondary | ICD-10-CM | POA: Insufficient documentation

## 2016-08-16 LAB — CBC WITH DIFFERENTIAL/PLATELET
Basophils Absolute: 0 10*3/uL (ref 0–0.1)
Basophils Relative: 1 %
Eosinophils Absolute: 0.1 10*3/uL (ref 0–0.7)
Eosinophils Relative: 2 %
HCT: 33.5 % — ABNORMAL LOW (ref 35.0–47.0)
Hemoglobin: 11.1 g/dL — ABNORMAL LOW (ref 12.0–16.0)
Lymphocytes Relative: 40 %
Lymphs Abs: 1.3 10*3/uL (ref 1.0–3.6)
MCH: 28.7 pg (ref 26.0–34.0)
MCHC: 33.2 g/dL (ref 32.0–36.0)
MCV: 86.4 fL (ref 80.0–100.0)
Monocytes Absolute: 0.3 10*3/uL (ref 0.2–0.9)
Monocytes Relative: 9 %
Neutro Abs: 1.6 10*3/uL (ref 1.4–6.5)
Neutrophils Relative %: 48 %
Platelets: 100 10*3/uL — ABNORMAL LOW (ref 150–440)
RBC: 3.87 MIL/uL (ref 3.80–5.20)
RDW: 14.6 % — ABNORMAL HIGH (ref 11.5–14.5)
WBC: 3.3 10*3/uL — ABNORMAL LOW (ref 3.6–11.0)

## 2016-08-16 NOTE — Assessment & Plan Note (Addendum)
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 On arimidex; I recommend stopping the Arimidex because of significant joint pains /body pains. [C discussion below]   # Myalgias/joint pains- question related to Arimidex versus other causes. Recommend a trial of discontinuation of Arimidex at this time for at least 2 months/until next visit.  # ? Lump in left breast-biopsy- April 2018-negative for malignancy.  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 [White count-3.3 normal differential; hemoglobin 11 platelets 100]- slightly worse from previous. Question etiology; if worsens would recommend a bone marrow biopsy.   # MD visit in 2 months/CBC/cmp/ldh; re-eval symptoms at that time.

## 2016-08-16 NOTE — Progress Notes (Signed)
Patient here today for follow up.  Patient c/o night sweats and leg cramps

## 2016-08-16 NOTE — Progress Notes (Signed)
[facility] NOTE  Patient Care Team: Albina Billet, MD as PCP - General (Internal Medicine)  CHIEF COMPLAINTS/PURPOSE OF CONSULTATION:   Oncology History   # AUG 2017- DCIS LEFT BREAST ER/PR- POS; positive margins [Dr.Smith]; s/p re-excision; s/p RT [finish RT Nov 10th 2017]; START ARIMIDEX- Jan 2018  # HRT [clinical trial thru [facility]; stopped July 2017 ]; CPAP      Ductal carcinoma in situ (DCIS) of left breast   10/12/2015 Initial Diagnosis    Ductal carcinoma in situ (DCIS) of left breast       Oncology History   # AUG 2017- DCIS LEFT BREAST ER/PR- POS; positive margins [Dr.Smith]; s/p re-excision; s/p RT [finish RT Nov 10th 2017]; START ARIMIDEX- Jan 2018  # HRT [clinical trial thru [facility]; stopped July 2017 ]; CPAP      Ductal carcinoma in situ (DCIS) of left breast   10/12/2015 Initial Diagnosis    Ductal carcinoma in situ (DCIS) of left breast       HISTORY OF PRESENTING ILLNESS:  Annette Foster 76 y.o.  female pleasant patient with diagnosed left breast DCIS in August 2017 s/p Radiation. Patient currently on Arimidex.  In the interim patient underwent a breast biopsy given abnormal mammogram- negative for many malignancy.  Patient complains significant joint pains; muscle pain- the last few months. She has been evaluated by neurology. She is awaiting nerve conduction study.  Appetite is good. No weight loss. No headaches. Patient denies any significant weight loss. No headaches. No bleeding. No fevers or chills or infections. Recent femur MRI negative for any obvious etiology for the pain  ROS: A complete 10 point review of system is done which is negative except mentioned above in history of present illness  MEDICAL HISTORY:  Past Medical History:  Diagnosis Date  . Abdominal pain   . Allergy   . Asthma   . Cancer [facility]) 2017   breast cancer- Left  . Cataract   . CHF (congestive heart failure) ([place])   . Collagenous colitis   . Diabetes  mellitus without complication ([place])   . Diarrhea   . Diverticulosis   . Fibrocystic breast   . GERD (gastroesophageal reflux disease)   . Heart murmur   . Hyperlipidemia   . Hypertension   . Hypothyroidism   . IBS (irritable bowel syndrome)   . IBS (irritable bowel syndrome)   . IDA (iron deficiency anemia)   . Personal history of radiation therapy   . PONV (postoperative nausea and vomiting)   . Seizures ([place])   . Sleep apnea     SURGICAL HISTORY: Past Surgical History:  Procedure Laterality Date  . ABDOMINAL HYSTERECTOMY     tah bso  . ABDOMINAL SURGERY    . APPENDECTOMY    . BREAST BIOPSY Bilateral    negative  . 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 BREAST BIOPSY Left 05/27/2016   path pending  . CARDIAC SURGERY    . CATARACT EXTRACTION Right   . CHOLECYSTECTOMY    . COLONOSCOPY WITH PROPOFOL N/A 03/11/2016   Procedure: COLONOSCOPY WITH PROPOFOL;  Surgeon: Manya Silvas, MD;  Location: [facility] ENDOSCOPY;  Service: Endoscopy;  Laterality: N/A;  . ESOPHAGOGASTRODUODENOSCOPY (EGD) WITH PROPOFOL N/A 03/11/2016   Procedure: ESOPHAGOGASTRODUODENOSCOPY (EGD) WITH PROPOFOL;  Surgeon: Manya Silvas, MD;  Location: [facility] ENDOSCOPY;  Service: Endoscopy;  Laterality: N/A;  . JOINT REPLACEMENT Left    TKR  . left sinusplasty     .  MASTECTOMY, PARTIAL Left 10/17/2015   Procedure: MASTECTOMY PARTIAL REVISION;  Surgeon: Leonie Green, MD;  Location: [facility] ORS;  Service: General;  Laterality: Left;  . PARTIAL MASTECTOMY WITH NEEDLE LOCALIZATION Left 09/29/2015   Procedure: PARTIAL MASTECTOMY WITH NEEDLE LOCALIZATION;  Surgeon: Leonie Green, MD;  Location: [facility] ORS;  Service: General;  Laterality: Left;  . 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  Social History   Social History  . Marital status: Married    Spouse name: N/A  . Number of  children: N/A  . Years of education: N/A   Occupational History  . Not on file.   Social History Main Topics  . Smoking status: Never Smoker  . Smokeless tobacco: Never Used  . Alcohol use No  . Drug use: No  . Sexual activity: Not Currently    Birth control/ protection: Surgical   Other Topics Concern  . Not on file   Social History Narrative  . No narrative on file    FAMILY HISTORY: Family History  Problem Relation Age of Onset  . Breast cancer Paternal Grandmother   . Colon cancer Father   . Diabetes Sister   . Diabetes Brother   . Heart disease Brother   . Colon cancer Maternal Uncle   . Ovarian cancer Neg Hx     ALLERGIES:  is allergic to fentanyl; codeine; demeclocycline; demerol [meperidine]; hydrocodone-acetaminophen; other; oxycodone; pentazocine; and tetracyclines & related.  MEDICATIONS:  Current Outpatient Prescriptions  Medication Sig Dispense Refill  . anastrozole (ARIMIDEX) 1 MG tablet Take 1 tablet (1 mg total) by mouth daily. 90 tablet 3  . aspirin 81 MG tablet Take 81 mg by mouth at bedtime.    . budesonide-formoterol (SYMBICORT) 80-4.5 MCG/ACT inhaler Inhale 2 puffs into the lungs 2 (two) times daily.    . cholecalciferol (VITAMIN D) 1000 units tablet Take 2,000 Units by mouth daily.     . Cyanocobalamin (B-12 COMPLIANCE INJECTION) 1000 MCG/ML KIT Inject 1 Syringe as directed every 30 (thirty) days.    Marland Kitchen escitalopram (LEXAPRO) 5 MG tablet Take 5 mg by mouth at bedtime.    Marland Kitchen esomeprazole (NEXIUM) 40 MG capsule Take by mouth.    . fenofibrate 160 MG tablet Take 160 mg by mouth at bedtime.    . fexofenadine (ALLEGRA) 180 MG tablet Take 180 mg by mouth at bedtime.     . fluticasone (FLONASE) 50 MCG/ACT nasal spray Place 1 spray into the nose daily as needed for allergies.     . furosemide (LASIX) 20 MG tablet Take 20-40 mg by mouth every other day. Alternate between 1 a day and 2 a day    . glimepiride (AMARYL) 2 MG tablet Take 2 mg by mouth at  bedtime.    Marland Kitchen Hyoscyamine Sulfate 0.375 MG TBCR Take 0.375 mg by mouth 2 (two) times daily.     . lansoprazole (PREVACID) 30 MG capsule Take 30 mg by mouth daily as needed (reflux).     Marland Kitchen levothyroxine (SYNTHROID, LEVOTHROID) 75 MCG tablet Take 75 mcg by mouth daily before breakfast.     . magnesium oxide (MAG-OX) 400 MG tablet Take by mouth.    . metFORMIN (GLUCOPHAGE-XR) 750 MG 24 hr tablet Take 750 mg by mouth 2 (two) times daily.     . montelukast (SINGULAIR) 10 MG tablet Take 10 mg by mouth at bedtime.     Marland Kitchen olopatadine (PATANOL) 0.1 %  ophthalmic solution 1 drop 2 (two) times daily.    . potassium chloride SA (K-DUR,KLOR-CON) 20 MEQ tablet Take by mouth.    . saccharomyces boulardii (FLORASTOR) 250 MG capsule Take 250 mg by mouth 2 (two) times daily.     . simvastatin (ZOCOR) 20 MG tablet Take 20 mg by mouth at bedtime.    . valsartan (DIOVAN) 160 MG tablet Take 160 mg by mouth every morning.     Melene Muller ON 09/07/2016] gabapentin (NEURONTIN) 300 MG capsule Take 300 mg by mouth daily.     No current facility-administered medications for this visit.       Marland Kitchen  PHYSICAL EXAMINATION: ECOG PERFORMANCE STATUS: 0 - Asymptomatic  Vitals:   08/16/16 1421  BP: (!) 101/53  Pulse: 65  Temp: 98 F (36.7 C)   Filed Weights   08/16/16 1421  Weight: 172 lb 2 oz (78.1 kg)    GENERAL: Well-nourished well-developed; Alert, no distress and comfortable.  Alone.  EYES: no pallor or icterus OROPHARYNX: no thrush or ulceration; good dentition  NECK: supple, no masses felt LYMPH:  no palpable lymphadenopathy in the cervical, axillary or inguinal regions LUNGS: clear to auscultation and  No wheeze or crackles HEART/CVS: regular rate & rhythm and no murmurs; No lower extremity edema ABDOMEN: abdomen soft, tender in LLQ with normal bowel sounds Musculoskeletal:no cyanosis of digits and no clubbing  PSYCH: alert & oriented x 3 with fluent speech NEURO: no focal motor/sensory deficits SKIN:  no  rashes or significant lesions Right and left BREAST exam [in the presence of nurse]- no unusual skin changes or dominant masses felt. Surgical scars noted. ? Thickening under the skin incision.    LABORATORY DATA:  I have reviewed the data as listed Lab Results  Component Value Date   WBC 3.3 (L) 08/16/2016   HGB 11.1 (L) 08/16/2016   HCT 33.5 (L) 08/16/2016   MCV 86.4 08/16/2016   PLT 100 (L) 08/16/2016    Recent Labs  02/15/16 0956 05/15/16 1340  NA 138 138  K 3.8 3.9  CL 102 104  CO2 29 28  GLUCOSE 137* 242*  BUN 13 11  CREATININE 0.78 0.61  CALCIUM 8.7* 9.4  GFRNONAA >60 >60  GFRAA >60 >60  PROT 6.9 7.2  ALBUMIN 3.7 3.9  AST 71* 45*  ALT 45 26  ALKPHOS 54 57  BILITOT 0.5 0.5    RADIOGRAPHIC STUDIES: I have personally reviewed the radiological images as listed and agreed with the findings in the report. Mr Frmur Right Wo Contrast  Result Date: 08/15/2016 CLINICAL DATA:  Leg cramping at night starting from groin radiating to the toes. History of breast cancer last year treated with radiation. EXAM: MR OF THE LEFT FEMUR WITHOUT CONTRAST; MRI OF THE RIGHT FEMUR WITHOUT CONTRAST TECHNIQUE: Multiplanar, multisequence MR imaging of the thighs from hip joints to the supracondylar portions of both femora was performed without IV contrast. CONTRAST:  None COMPARISON:  None. FINDINGS: Bones/Joint/Cartilage No fracture, focal osseous lesion or bone destruction. Susceptibility artifact is noted about the left knee presumably from prior surgical intervention or arthroplasty. Slight joint space narrowing noted of both hips. The femoral heads are spherical in appearance without flattening. No marrow signal abnormalities. Ligaments Negative Muscles and Tendons No intramuscular mass, hemorrhage or edema. Soft tissues Partially visualized suprapatellar joint effusions both knees. No apparent veno-occlusive disease. IMPRESSION: 1. Mild degenerative joint space narrowing both hips. 2. Small  suprapatellar joint effusions are partially imaged. 3. Susceptibility artifact emanating  from the left knee presumably on the basis of prior surgical intervention. 4. No acute nor suspicious osseous lesions. No marrow signal abnormalities. 5. No focal soft tissue mass, adenopathy or hemorrhage. Electronically Signed   By: Ashley Royalty M.D.   On: 08/15/2016 15:13   Mr Femur Left Wo Contrast  Result Date: 08/15/2016 CLINICAL DATA:  Leg cramping at night starting from groin radiating to the toes. History of breast cancer last year treated with radiation. EXAM: MR OF THE LEFT FEMUR WITHOUT CONTRAST; MRI OF THE RIGHT FEMUR WITHOUT CONTRAST TECHNIQUE: Multiplanar, multisequence MR imaging of the thighs from hip joints to the supracondylar portions of both femora was performed without IV contrast. CONTRAST:  None COMPARISON:  None. FINDINGS: Bones/Joint/Cartilage No fracture, focal osseous lesion or bone destruction. Susceptibility artifact is noted about the left knee presumably from prior surgical intervention or arthroplasty. Slight joint space narrowing noted of both hips. The femoral heads are spherical in appearance without flattening. No marrow signal abnormalities. Ligaments Negative Muscles and Tendons No intramuscular mass, hemorrhage or edema. Soft tissues Partially visualized suprapatellar joint effusions both knees. No apparent veno-occlusive disease. IMPRESSION: 1. Mild degenerative joint space narrowing both hips. 2. Small suprapatellar joint effusions are partially imaged. 3. Susceptibility artifact emanating from the left knee presumably on the basis of prior surgical intervention. 4. No acute nor suspicious osseous lesions. No marrow signal abnormalities. 5. No focal soft tissue mass, adenopathy or hemorrhage. Electronically Signed   By: Ashley Royalty M.D.   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 On arimidex; I recommend stopping the Arimidex because of significant joint pains /body pains. [C discussion below]   # Myalgias/joint pains- question related to Arimidex versus other causes. Recommend a trial of discontinuation of Arimidex at this time for at least 2 months/until next visit.  # ? Lump in left breast-biopsy- April 2018-negative for malignancy.  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 [White count-3.3 normal differential; hemoglobin 11 platelets 100]- slightly worse from previous. Question etiology; if worsens would recommend a bone marrow biopsy.   # MD visit in 2 months/CBC/cmp/ldh; re-eval symptoms at that time.      Cammie Sickle, MD 08/16/2016 5:38 PM

## 2016-08-19 ENCOUNTER — Encounter: Payer: Self-pay | Admitting: Urology

## 2016-08-19 ENCOUNTER — Ambulatory Visit (INDEPENDENT_AMBULATORY_CARE_PROVIDER_SITE_OTHER): Payer: Medicare Other | Admitting: Urology

## 2016-08-19 VITALS — BP 135/69 | HR 63 | Ht 63.0 in | Wt 172.5 lb

## 2016-08-19 DIAGNOSIS — R300 Dysuria: Secondary | ICD-10-CM

## 2016-08-19 LAB — URINALYSIS, COMPLETE
Bilirubin, UA: NEGATIVE
Glucose, UA: NEGATIVE
Ketones, UA: NEGATIVE
Nitrite, UA: NEGATIVE
Protein, UA: NEGATIVE
RBC, UA: NEGATIVE
Specific Gravity, UA: 1.02 (ref 1.005–1.030)
Urobilinogen, Ur: 0.2 mg/dL (ref 0.2–1.0)
pH, UA: 5 (ref 5.0–7.5)

## 2016-08-19 LAB — MICROSCOPIC EXAMINATION

## 2016-08-19 MED ORDER — MIRABEGRON ER 50 MG PO TB24: 50.0000 mg | ORAL_TABLET | Freq: Every day | ORAL | 3 refills | Status: DC

## 2016-08-19 NOTE — Progress Notes (Signed)
08/19/2016 5:27 PM   Annette Foster 02-15-1940 [id number]  Referring provider: Albina Billet, MD [address]   [place], [place] [address]  Chief Complaint  Patient presents with  . Dysuria    referred by Dr. Quenten Raven    HPI: 76 year old female who is seen today for further evaluation and management of dysuria and pelvic pain. The patient has had these symptoms for at least the last 6 months, around the time of her breast cancer treatment. She describes constant bladder pressure, dysuria, urinary frequency, and urgency. Her last several cultures of been negative, despite having symptoms. She denies any hematuria. Prior to these recent symptoms she has no real history of recurrent urinary tract infections. She denies a history of dyspareunia or constipation. She feels that she empties her bladder effectively.     PMH: Past Medical History:  Diagnosis Date  . Abdominal pain   . Allergy   . Asthma   . Cancer [facility]) 2017   breast cancer- Left  . Cataract   . CHF (congestive heart failure) ([place])   . Collagenous colitis   . Diabetes mellitus without complication ([place])   . Diarrhea   . Diverticulosis   . Fibrocystic breast   . GERD (gastroesophageal reflux disease)   . Heart murmur   . Hyperlipidemia   . Hypertension   . Hypothyroidism   . IBS (irritable bowel syndrome)   . IBS (irritable bowel syndrome)   . IDA (iron deficiency anemia)   . Personal history of radiation therapy   . PONV (postoperative nausea and vomiting)   . Seizures ([place])   . Sleep apnea     Surgical History: Past Surgical History:  Procedure Laterality Date  . ABDOMINAL HYSTERECTOMY     tah bso  . ABDOMINAL SURGERY    . APPENDECTOMY    . BREAST BIOPSY Bilateral    negative  . 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 BREAST BIOPSY Left 05/27/2016   path pending  . CARDIAC SURGERY    . CATARACT EXTRACTION Right   . CHOLECYSTECTOMY    . COLONOSCOPY WITH  PROPOFOL N/A 03/11/2016   Procedure: COLONOSCOPY WITH PROPOFOL;  Surgeon: Manya Silvas, MD;  Location: [facility] ENDOSCOPY;  Service: Endoscopy;  Laterality: N/A;  . ESOPHAGOGASTRODUODENOSCOPY (EGD) WITH PROPOFOL N/A 03/11/2016   Procedure: ESOPHAGOGASTRODUODENOSCOPY (EGD) WITH PROPOFOL;  Surgeon: Manya Silvas, MD;  Location: [facility] ENDOSCOPY;  Service: Endoscopy;  Laterality: N/A;  . JOINT REPLACEMENT Left    TKR  . left sinusplasty     . MASTECTOMY, PARTIAL Left 10/17/2015   Procedure: MASTECTOMY PARTIAL REVISION;  Surgeon: Leonie Green, MD;  Location: [facility] ORS;  Service: General;  Laterality: Left;  . PARTIAL MASTECTOMY WITH NEEDLE LOCALIZATION Left 09/29/2015   Procedure: PARTIAL MASTECTOMY WITH NEEDLE LOCALIZATION;  Surgeon: Leonie Green, MD;  Location: [facility] ORS;  Service: General;  Laterality: Left;  . TOTAL ABDOMINAL HYSTERECTOMY W/ BILATERAL SALPINGOOPHORECTOMY      Home Medications:  Allergies as of 08/19/2016      Reactions   Fentanyl Shortness Of Breath, Itching, Nausea And Vomiting   Codeine Itching, Nausea And Vomiting   Demeclocycline Other (See Comments), Rash   Demerol [meperidine] Itching, Nausea And Vomiting   Hydrocodone-acetaminophen Itching, Nausea And Vomiting   Other Other (See Comments)   Oxycodone Itching, Nausea And Vomiting   Pentazocine Other (See Comments)   Tetracyclines & Related Rash      Medication List  Accurate as of 08/19/16  5:27 PM. Always use your most recent med list.          anastrozole 1 MG tablet Commonly known as:  ARIMIDEX Take 1 tablet (1 mg total) by mouth daily.   aspirin 81 MG tablet Take 81 mg by mouth at bedtime.   B-12 COMPLIANCE INJECTION 1000 MCG/ML Kit Generic drug:  Cyanocobalamin Inject 1 Syringe as directed every 30 (thirty) days.   budesonide-formoterol 80-4.5 MCG/ACT inhaler Commonly known as:  SYMBICORT Inhale 2 puffs into the lungs 2 (two) times daily.   cholecalciferol 1000 units  tablet Commonly known as:  VITAMIN D Take 2,000 Units by mouth daily.   escitalopram 5 MG tablet Commonly known as:  LEXAPRO Take 5 mg by mouth at bedtime.   esomeprazole 40 MG capsule Commonly known as:  NEXIUM Take by mouth.   fenofibrate 160 MG tablet Take 160 mg by mouth at bedtime.   fexofenadine 180 MG tablet Commonly known as:  ALLEGRA Take 180 mg by mouth at bedtime.   fluticasone 50 MCG/ACT nasal spray Commonly known as:  FLONASE Place 1 spray into the nose daily as needed for allergies.   furosemide 20 MG tablet Commonly known as:  LASIX Take 20-40 mg by mouth every other day. Alternate between 1 a day and 2 a day   gabapentin 300 MG capsule Commonly known as:  NEURONTIN Take 300 mg by mouth daily. Start taking on:  09/07/2016   glimepiride 2 MG tablet Commonly known as:  AMARYL Take 2 mg by mouth at bedtime.   Hyoscyamine Sulfate 0.375 MG Tbcr Take 0.375 mg by mouth 2 (two) times daily.   lansoprazole 30 MG capsule Commonly known as:  PREVACID Take 30 mg by mouth daily as needed (reflux).   levothyroxine 75 MCG tablet Commonly known as:  SYNTHROID, LEVOTHROID Take 75 mcg by mouth daily before breakfast.   magnesium oxide 400 MG tablet Commonly known as:  MAG-OX Take by mouth.   metFORMIN 750 MG 24 hr tablet Commonly known as:  GLUCOPHAGE-XR Take 750 mg by mouth 2 (two) times daily.   mirabegron ER 50 MG Tb24 tablet Commonly known as:  MYRBETRIQ Take 1 tablet (50 mg total) by mouth daily.   montelukast 10 MG tablet Commonly known as:  SINGULAIR Take 10 mg by mouth at bedtime.   olopatadine 0.1 % ophthalmic solution Commonly known as:  PATANOL 1 drop 2 (two) times daily.   potassium chloride SA 20 MEQ tablet Commonly known as:  K-DUR,KLOR-CON Take by mouth.   saccharomyces boulardii 250 MG capsule Commonly known as:  FLORASTOR Take 250 mg by mouth 2 (two) times daily.   simvastatin 20 MG tablet Commonly known as:  ZOCOR Take 20 mg  by mouth at bedtime.   valsartan 160 MG tablet Commonly known as:  DIOVAN Take 160 mg by mouth every morning.       Allergies:  Allergies  Allergen Reactions  . Fentanyl Shortness Of Breath, Itching and Nausea And Vomiting  . Codeine Itching and Nausea And Vomiting  . Demeclocycline Other (See Comments) and Rash  . Demerol [Meperidine] Itching and Nausea And Vomiting  . Hydrocodone-Acetaminophen Itching and Nausea And Vomiting  . Other Other (See Comments)  . Oxycodone Itching and Nausea And Vomiting  . Pentazocine Other (See Comments)  . Tetracyclines & Related Rash    Family History: Family History  Problem Relation Age of Onset  . Breast cancer Paternal Grandmother   . Colon cancer  Father   . Diabetes Sister   . Diabetes Brother   . Heart disease Brother   . Prostate cancer Brother   . Colon cancer Maternal Uncle   . Prostate cancer Brother   . Bladder Cancer Brother   . Ovarian cancer Neg Hx   . Kidney cancer Neg Hx     Social History:  reports that she has never smoked. She has never used smokeless tobacco. She reports that she does not drink alcohol or use drugs.  ROS: UROLOGY Frequent Urination?: Yes Hard to postpone urination?: Yes Burning/pain with urination?: Yes Get up at night to urinate?: Yes Leakage of urine?: Yes Urine stream starts and stops?: No Trouble starting stream?: No Do you have to strain to urinate?: No Blood in urine?: Yes Urinary tract infection?: Yes Sexually transmitted disease?: No Injury to kidneys or bladder?: No Painful intercourse?: No Weak stream?: No Currently pregnant?: No Vaginal bleeding?: No Last menstrual period?: n  Gastrointestinal Nausea?: No Vomiting?: No Indigestion/heartburn?: Yes Diarrhea?: Yes Constipation?: No  Constitutional Fever: No Night sweats?: Yes Weight loss?: No Fatigue?: Yes  Skin Skin rash/lesions?: No Itching?: No  Eyes Blurred vision?: No Double vision?:  No  Ears/Nose/Throat Sore throat?: No Sinus problems?: Yes  Hematologic/Lymphatic Swollen glands?: No Easy bruising?: No  Cardiovascular Leg swelling?: No Chest pain?: No  Respiratory Cough?: No Shortness of breath?: No  Endocrine Excessive thirst?: Yes  Musculoskeletal Back pain?: No Joint pain?: No  Neurological Headaches?: No Dizziness?: No  Psychologic Depression?: No Anxiety?: No  Physical Exam: BP 135/69   Pulse 63   Ht _0  (1.6 m)   Wt 78.2 kg (172 lb 8 oz)   LMP  (LMP Unknown)   BMI 30.56 kg/m   Constitutional:  Alert and oriented, No acute distress. HEENT: [place] AT, moist mucus membranes.  Trachea midline, no masses. Cardiovascular: No clubbing, cyanosis, or edema. Respiratory: Normal respiratory effort, no increased work of breathing. GI: Abdomen is soft, nontender, nondistended, no abdominal masses GU: No CVA tenderness.  The pelvic exam was deferred at this time, the patient states that she's recently had a pelvic exam with her gynecologist who noted normal anatomy without evidence of prolapse. Skin: No rashes, bruises or suspicious lesions. Lymph: No cervical or inguinal adenopathy. Neurologic: Grossly intact, no focal deficits, moving all 4 extremities. Psychiatric: Normal mood and affect.  Laboratory Data: Lab Results  Component Value Date   WBC 3.3 (L) 08/16/2016   HGB 11.1 (L) 08/16/2016   HCT 33.5 (L) 08/16/2016   MCV 86.4 08/16/2016   PLT 100 (L) 08/16/2016    Lab Results  Component Value Date   CREATININE 0.61 05/15/2016    No results found for: PSA  No results found for: TESTOSTERONE  No results found for: HGBA1C  Urinalysis    Component Value Date/Time   APPEARANCEUR Clear 08/19/2016 0929   GLUCOSEU Negative 08/19/2016 0929   BILIRUBINUR Negative 08/19/2016 0929   PROTEINUR Negative 08/19/2016 0929   UROBILINOGEN 0.2 07/30/2016 1034   NITRITE Negative 08/19/2016 0929   LEUKOCYTESUR Trace (A) 08/19/2016 0929     Pertinent Imaging: None  Assessment & Plan:  The patient has pelvic floor dysfunction manifest with frequency, urgency, and dysuria. I spoke to the patient about the treatment options and strongly encourage the patient to consider pelvic floor physical therapy. This will be very helpful for her dysuria as well as her overactive symptoms. I did give her a trial of myrbetriq 50 mg daily as well as prescription  for this. I made a referral to physical therapy, and we'll plan to follow up with the patient in 3 months. I did encourage her to come back sooner if her symptoms got worse, she would likely need a urine culture at that time.  1. Dysuria  - Urinalysis, Complete - Ambulatory referral to Physical Therapy   Return in about 3 months (around 11/19/2016).  Ardis Hughs, [facility] [address] [place], [place] [address] [phone]

## 2016-08-26 ENCOUNTER — Ambulatory Visit (INDEPENDENT_AMBULATORY_CARE_PROVIDER_SITE_OTHER): Payer: Medicare Other | Admitting: Podiatry

## 2016-08-26 ENCOUNTER — Other Ambulatory Visit: Payer: Self-pay | Admitting: Surgery

## 2016-08-26 DIAGNOSIS — Z853 Personal history of malignant neoplasm of breast: Secondary | ICD-10-CM

## 2016-08-26 DIAGNOSIS — M79676 Pain in unspecified toe(s): Secondary | ICD-10-CM

## 2016-08-26 DIAGNOSIS — B351 Tinea unguium: Secondary | ICD-10-CM | POA: Diagnosis not present

## 2016-08-26 NOTE — Progress Notes (Signed)
Complaint:  Visit Type: Patient returns to my office for continued preventative foot care services. Complaint: Patient states" my nails have grown long and thick and become painful to walk and wear shoes" Patient has been diagnosed with DM with no foot complications. The patient presents for preventative foot care services. No changes to ROS  Podiatric Exam: Vascular: dorsalis pedis and posterior tibial pulses are palpable bilateral. Capillary return is immediate. Temperature gradient is WNL. Skin turgor WNL  Sensorium: Normal Semmes Weinstein monofilament test. Normal tactile sensation bilaterally. Nail Exam: Pt has thick disfigured discolored nails with subungual debris noted bilateral entire nail hallux through fifth toenails.  Right hallux nail plate is absent.  Surgery has healed. Ulcer Exam: There is no evidence of ulcer or pre-ulcerative changes or infection. Orthopedic Exam: Muscle tone and strength are WNL. No limitations in general ROM. No crepitus or effusions noted. Foot type and digits show no abnormalities. Bony prominences are unremarkable. Skin: No Porokeratosis. No infection or ulcers  Diagnosis:  Onychomycosis, , Pain in right toe, pain in left toes  Treatment & Plan Procedures and Treatment: Consent by patient was obtained for treatment procedures. The patient understood the discussion of treatment and procedures well. All questions were answered thoroughly reviewed. Debridement of mycotic and hypertrophic toenails, 1 through 5 bilateral and clearing of subungual debris. No ulceration, no infection noted.  There is a circular skin lesion healing under left forefoot. Return Visit-Office Procedure: Patient instructed to return to the office for a follow up visit 3 months for continued evaluation and treatment.    Gardiner Barefoot DPM

## 2016-08-27 ENCOUNTER — Ambulatory Visit: Payer: Medicare Other

## 2016-09-13 ENCOUNTER — Ambulatory Visit
Admission: RE | Admit: 2016-09-13 | Discharge: 2016-09-13 | Disposition: A | Discharge status: Home / Self Care | Payer: Medicare Other | Source: Ambulatory Visit | Attending: Surgery | Admitting: Surgery

## 2016-09-13 DIAGNOSIS — Z853 Personal history of malignant neoplasm of breast: Secondary | ICD-10-CM

## 2016-09-16 ENCOUNTER — Telehealth: Payer: Self-pay | Admitting: Obstetrics and Gynecology

## 2016-09-16 NOTE — Telephone Encounter (Signed)
ERROR  This was an appointment issue - done

## 2016-09-26 ENCOUNTER — Encounter: Payer: Medicare Other | Admitting: Obstetrics and Gynecology

## 2016-10-02 ENCOUNTER — Encounter: Payer: Medicare Other | Admitting: Obstetrics and Gynecology

## 2016-10-08 ENCOUNTER — Ambulatory Visit (INDEPENDENT_AMBULATORY_CARE_PROVIDER_SITE_OTHER): Payer: Medicare Other | Admitting: Obstetrics and Gynecology

## 2016-10-08 ENCOUNTER — Encounter: Payer: Self-pay | Admitting: Obstetrics and Gynecology

## 2016-10-08 VITALS — BP 139/64 | HR 66 | Ht 63.0 in | Wt 175.6 lb

## 2016-10-08 DIAGNOSIS — N763 Subacute and chronic vulvitis: Secondary | ICD-10-CM | POA: Diagnosis not present

## 2016-10-08 DIAGNOSIS — L292 Pruritus vulvae: Secondary | ICD-10-CM | POA: Diagnosis not present

## 2016-10-08 DIAGNOSIS — L308 Other specified dermatitis: Secondary | ICD-10-CM | POA: Insufficient documentation

## 2016-10-08 DIAGNOSIS — L408 Other psoriasis: Secondary | ICD-10-CM

## 2016-10-08 NOTE — Progress Notes (Signed)
Chief complaint: 1. Vulvar itching 2. 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 Patient has been using Temovate ointment 0.05% topically; most recently she has been placing the medication on an every other day regimen area she continues to have itching and burning at night which prompts her to wake up from sleep. There have been no changes in patient's use of detergents or underwear. She continues to use of DOVE soap for cleansing.   Past medical history, past surgical history, problem list, medications, and allergies are reviewed  OBJECTIVE: BP 139/64   Pulse 66   Ht 5' 3"  (1.6 m)   Wt 175 lb 9.6 oz (79.7 kg)   LMP  (LMP Unknown)   BMI 31.11 kg/m   Pleasant elderly female in no acute distress. Alert and oriented. Abdomen: Soft, nontender  Pelvic exam: External genitalia-vulvar hyperemia, confluence;  perianal region notable for hyperemia without leukoplakia Vagina-not examined  ASSESSMENT: 1. Chronic vulvitis, symptomatic, without resolution with every other day steroid dosing  PLAN: 1. Increase Temovate ointment 0.05% topically daily 2. Return in 4 weeks for follow-up and possible repeat biopsies  A total of 15 minutes were spent face-to-face with the patient during this encounter and over half of that time dealt with counseling and coordination of care.  Brayton Mars, MD  Note: This dictation was prepared with Dragon dictation along with smaller phrase technology. Any transcriptional errors that result from this process are unintentional.

## 2016-10-08 NOTE — Patient Instructions (Signed)
1. Recommend Temovate ointment 0.05% topically to the vulva daily for the next 4 weeks 2. Recommend continued blow drying of perineum after bathing 3. Return in 4 weeks for follow-up

## 2016-10-25 ENCOUNTER — Inpatient Hospital Stay: Payer: Medicare Other | Attending: Internal Medicine

## 2016-10-25 ENCOUNTER — Inpatient Hospital Stay (HOSPITAL_BASED_OUTPATIENT_CLINIC_OR_DEPARTMENT_OTHER): Payer: Medicare Other | Admitting: Internal Medicine

## 2016-10-25 ENCOUNTER — Other Ambulatory Visit: Payer: Self-pay

## 2016-10-25 VITALS — BP 137/57 | HR 61 | Temp 97.6°F | Resp 20 | Ht 63.0 in | Wt 174.4 lb

## 2016-10-25 DIAGNOSIS — K219 Gastro-esophageal reflux disease without esophagitis: Secondary | ICD-10-CM | POA: Insufficient documentation

## 2016-10-25 DIAGNOSIS — E039 Hypothyroidism, unspecified: Secondary | ICD-10-CM | POA: Insufficient documentation

## 2016-10-25 DIAGNOSIS — K588 Other irritable bowel syndrome: Secondary | ICD-10-CM | POA: Insufficient documentation

## 2016-10-25 DIAGNOSIS — J45909 Unspecified asthma, uncomplicated: Secondary | ICD-10-CM | POA: Insufficient documentation

## 2016-10-25 DIAGNOSIS — Z79811 Long term (current) use of aromatase inhibitors: Secondary | ICD-10-CM

## 2016-10-25 DIAGNOSIS — I10 Essential (primary) hypertension: Secondary | ICD-10-CM | POA: Diagnosis not present

## 2016-10-25 DIAGNOSIS — D61818 Other pancytopenia: Secondary | ICD-10-CM | POA: Insufficient documentation

## 2016-10-25 DIAGNOSIS — Z9012 Acquired absence of left breast and nipple: Secondary | ICD-10-CM | POA: Diagnosis not present

## 2016-10-25 DIAGNOSIS — D0512 Intraductal carcinoma in situ of left breast: Secondary | ICD-10-CM

## 2016-10-25 DIAGNOSIS — I509 Heart failure, unspecified: Secondary | ICD-10-CM | POA: Diagnosis not present

## 2016-10-25 DIAGNOSIS — Z170 Estrogen receptor positive status [ER+]: Secondary | ICD-10-CM | POA: Insufficient documentation

## 2016-10-25 DIAGNOSIS — E785 Hyperlipidemia, unspecified: Secondary | ICD-10-CM | POA: Insufficient documentation

## 2016-10-25 DIAGNOSIS — Z79899 Other long term (current) drug therapy: Secondary | ICD-10-CM | POA: Diagnosis not present

## 2016-10-25 DIAGNOSIS — Z923 Personal history of irradiation: Secondary | ICD-10-CM

## 2016-10-25 DIAGNOSIS — E119 Type 2 diabetes mellitus without complications: Secondary | ICD-10-CM | POA: Diagnosis not present

## 2016-10-25 DIAGNOSIS — D696 Thrombocytopenia, unspecified: Secondary | ICD-10-CM

## 2016-10-25 LAB — COMPREHENSIVE METABOLIC PANEL
ALT: 37 U/L (ref 14–54)
AST: 70 U/L — ABNORMAL HIGH (ref 15–41)
Albumin: 3.7 g/dL (ref 3.5–5.0)
Alkaline Phosphatase: 75 U/L (ref 38–126)
Anion gap: 9 (ref 5–15)
BUN: 15 mg/dL (ref 6–20)
CO2: 27 mmol/L (ref 22–32)
Calcium: 9.2 mg/dL (ref 8.9–10.3)
Chloride: 102 mmol/L (ref 101–111)
Creatinine, Ser: 0.57 mg/dL (ref 0.44–1.00)
GFR calc Af Amer: >60 mL/min (ref >60)
GFR calc non Af Amer: >60 mL/min (ref >60)
Glucose, Bld: 209 mg/dL — ABNORMAL HIGH (ref 65–99)
Potassium: 4.4 mmol/L (ref 3.5–5.1)
Sodium: 138 mmol/L (ref 135–145)
Total Bilirubin: 0.5 mg/dL (ref 0.3–1.2)
Total Protein: 7.3 g/dL (ref 6.5–8.1)

## 2016-10-25 LAB — CBC WITH DIFFERENTIAL/PLATELET
Basophils Absolute: 0 10*3/uL (ref 0–0.1)
Basophils Relative: 1 %
Eosinophils Absolute: 0.1 10*3/uL (ref 0–0.7)
Eosinophils Relative: 3 %
HCT: 32.5 % — ABNORMAL LOW (ref 35.0–47.0)
Hemoglobin: 11.1 g/dL — ABNORMAL LOW (ref 12.0–16.0)
Lymphocytes Relative: 37 %
Lymphs Abs: 1 10*3/uL (ref 1.0–3.6)
MCH: 29.4 pg (ref 26.0–34.0)
MCHC: 34 g/dL (ref 32.0–36.0)
MCV: 86.6 fL (ref 80.0–100.0)
Monocytes Absolute: 0.3 10*3/uL (ref 0.2–0.9)
Monocytes Relative: 10 %
Neutro Abs: 1.3 10*3/uL — ABNORMAL LOW (ref 1.4–6.5)
Neutrophils Relative %: 49 %
Platelets: 101 10*3/uL — ABNORMAL LOW (ref 150–440)
RBC: 3.76 MIL/uL — ABNORMAL LOW (ref 3.80–5.20)
RDW: 15.3 % — ABNORMAL HIGH (ref 11.5–14.5)
WBC: 2.7 10*3/uL — ABNORMAL LOW (ref 3.6–11.0)

## 2016-10-25 LAB — IRON AND TIBC
Iron: 68 ug/dL (ref 28–170)
Saturation Ratios: 13 % (ref 10.4–31.8)
TIBC: 517 ug/dL — ABNORMAL HIGH (ref 250–450)
UIBC: 449 ug/dL

## 2016-10-25 LAB — FERRITIN: Ferritin: 19 ng/mL (ref 11–307)

## 2016-10-25 LAB — VITAMIN B12: Vitamin B-12: 352 pg/mL (ref 180–914)

## 2016-10-25 LAB — FOLATE: Folate: 12.4 ng/mL (ref >5.9)

## 2016-10-25 LAB — LACTATE DEHYDROGENASE: LDH: 165 U/L (ref 98–192)

## 2016-10-25 MED ORDER — LETROZOLE 2.5 MG PO TABS: 2.5000 mg | ORAL_TABLET | Freq: Every day | ORAL | 3 refills | Status: DC

## 2016-10-25 NOTE — Progress Notes (Signed)
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  # Mild pancytopenia [CT-jan 2018-no hepatomegaly/spleneomegaly].   # HRT [clinical trial thru [facility]; stopped July 2017 ]; CPAP      Ductal carcinoma in situ (DCIS) of left breast     Oncology History   # AUG 2017- DCIS LEFT BREAST ER/PR- POS; positive margins [Dr.Smith]; s/p re-excision; s/p RT [finish RT Nov 10th 2017]; START ARIMIDEX- Jan 2018; Stopped in July 2018- sec to muscle cramps; Sep 2018- start Letrozole.  # Mild pancytopenia [CT-jan 2018-no hepatomegaly/spleneomegaly].   # HRT [clinical trial thru [facility]; stopped July 2017 ]; CPAP      Ductal carcinoma in situ (DCIS) of left breast     HISTORY OF PRESENTING ILLNESS:  Annette Foster 76 y.o.  female pleasant patient with left breast DCIS in August 2017 s/p Radiation. Patient's Arimidex has been on hold for the last 2 months- given the muscle cramps. The muscle cramps have improved since stopping the Arimidex.  Patient denies any fevers or chills. Denies any nausea vomiting diarrhea. Denies any Coughing or shortness of breath. Admits to mild easy bruising. No nosebleeds.  ROS: A complete 10 point review of system is done which is negative except mentioned above in history of present illness  MEDICAL HISTORY:  Past Medical History:  Diagnosis Date  . Abdominal pain   . Allergy   . Asthma   . Cancer [facility]) 2017   breast cancer- Left  . Cataract   . CHF (congestive heart failure) (HCC)   . Collagenous colitis   . Diabetes mellitus without complication (HCC)   . Diarrhea   . Diverticulosis   . Fibrocystic breast   . GERD  (gastroesophageal reflux disease)   . Heart murmur   . Hyperlipidemia   . Hypertension   . Hypothyroidism   . IBS (irritable bowel syndrome)   . IBS (irritable bowel syndrome)   . IDA (iron deficiency anemia)   . Personal history of radiation therapy   . PONV (postoperative nausea and vomiting)   . Seizures (HCC)   . Sleep apnea     SURGICAL HISTORY: Past Surgical History:  Procedure Laterality Date  . ABDOMINAL HYSTERECTOMY     [facility]  . ABDOMINAL SURGERY    . APPENDECTOMY    . BREAST BIOPSY Bilateral    negative  . 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 BREAST BIOPSY Left 05/27/2016   neg  . CARDIAC SURGERY    . CATARACT EXTRACTION Right   . CHOLECYSTECTOMY    . COLONOSCOPY WITH PROPOFOL N/A 03/11/2016   Procedure: COLONOSCOPY WITH PROPOFOL;  Surgeon: Scot Jun, MD;  Location: [facility] ENDOSCOPY;  Service: Endoscopy;  Laterality: N/A;  . ESOPHAGOGASTRODUODENOSCOPY (EGD) WITH PROPOFOL N/A 03/11/2016   Procedure: ESOPHAGOGASTRODUODENOSCOPY (EGD) WITH PROPOFOL;  Surgeon: Scot Jun, MD;  Location: [facility] ENDOSCOPY;  Service: Endoscopy;  Laterality: N/A;  . JOINT REPLACEMENT Left    TKR  . left sinusplasty     . MASTECTOMY, PARTIAL Left 10/17/2015   Procedure: MASTECTOMY PARTIAL REVISION;  Surgeon: Nadeen Landau, MD;  Location: [facility] ORS;  Service: General;  Laterality: Left;  . PARTIAL MASTECTOMY WITH NEEDLE LOCALIZATION Left 09/29/2015   Procedure: PARTIAL MASTECTOMY WITH NEEDLE LOCALIZATION;  Surgeon: Leonie Green, MD;  Location: [facility] ORS;  Service: General;  Laterality: Left;  . 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  Social History   Social History  . Marital status: Married    Spouse name: N/A  . Number of children: N/A  . Years of education: N/A   Occupational History  . Not on file.   Social History Main Topics   . Smoking status: Never Smoker  . Smokeless tobacco: Never Used  . Alcohol use No  . Drug use: No  . Sexual activity: Not Currently    Birth control/ protection: Surgical   Other Topics Concern  . Not on file   Social History Narrative  . No narrative on file    FAMILY HISTORY: Family History  Problem Relation Age of Onset  . Breast cancer Paternal Grandmother   . Colon cancer Father   . Diabetes Sister   . Diabetes Brother   . Heart disease Brother   . Prostate cancer Brother   . Colon cancer Maternal Uncle   . Prostate cancer Brother   . Bladder Cancer Brother   . Ovarian cancer Neg Hx   . Kidney cancer Neg Hx     ALLERGIES:  is allergic to fentanyl; codeine; demeclocycline; demerol [meperidine]; hydrocodone-acetaminophen; other; oxycodone; pentazocine; and tetracyclines & related.  MEDICATIONS:  Current Outpatient Prescriptions  Medication Sig Dispense Refill  . aspirin 81 MG tablet Take 81 mg by mouth at bedtime.    . cholecalciferol (VITAMIN D) 1000 units tablet Take 2,000 Units by mouth daily.     . Cyanocobalamin (B-12 COMPLIANCE INJECTION) 1000 MCG/ML KIT Inject 1 Syringe as directed every 30 (thirty) days.    Marland Kitchen escitalopram (LEXAPRO) 5 MG tablet Take 5 mg by mouth at bedtime.    Marland Kitchen esomeprazole (NEXIUM) 40 MG capsule Take 40 mg by mouth daily at 12 noon.     . fenofibrate 160 MG tablet Take 160 mg by mouth at bedtime.    . fexofenadine (ALLEGRA) 180 MG tablet Take 180 mg by mouth at bedtime.     . furosemide (LASIX) 20 MG tablet Take 20-40 mg by mouth every other day. Alternate between 1 a day and 2 a day    . gabapentin (NEURONTIN) 300 MG capsule Take 300 mg by mouth daily.    Marland Kitchen glimepiride (AMARYL) 2 MG tablet Take 2 mg by mouth at bedtime.    Marland Kitchen Hyoscyamine Sulfate 0.375 MG TBCR Take 0.375 mg by mouth 2 (two) times daily.     . lansoprazole (PREVACID) 30 MG capsule Take 30 mg by mouth daily as needed (reflux).     Marland Kitchen levothyroxine (SYNTHROID, LEVOTHROID) 75  MCG tablet Take 75 mcg by mouth daily before breakfast.     . magnesium oxide (MAG-OX) 400 MG tablet Take by mouth.    . metFORMIN (GLUCOPHAGE-XR) 750 MG 24 hr tablet Take 750 mg by mouth 2 (two) times daily.     . mirabegron ER (MYRBETRIQ) 50 MG TB24 tablet Take 1 tablet (50 mg total) by mouth daily. 30 tablet 3  . montelukast (SINGULAIR) 10 MG tablet Take 10 mg by mouth at bedtime.     Marland Kitchen olopatadine (PATANOL) 0.1 % ophthalmic solution 1 drop 2 (two) times daily.    . potassium chloride SA (  K-DUR,KLOR-CON) 20 MEQ tablet Take by mouth.    . saccharomyces boulardii (FLORASTOR) 250 MG capsule Take 250 mg by mouth 2 (two) times daily.     . simvastatin (ZOCOR) 20 MG tablet Take 20 mg by mouth at bedtime.    . valsartan (DIOVAN) 160 MG tablet Take 160 mg by mouth every morning.     . budesonide-formoterol (SYMBICORT) 80-4.5 MCG/ACT inhaler Inhale 2 puffs into the lungs 2 (two) times daily as needed (shortness of breath).     . fluticasone (FLONASE) 50 MCG/ACT nasal spray Place 1 spray into the nose daily as needed for allergies.     Marland Kitchen letrozole (FEMARA) 2.5 MG tablet Take 1 tablet (2.5 mg total) by mouth daily. Once a day. 30 tablet 3   No current facility-administered medications for this visit.       Marland Kitchen  PHYSICAL EXAMINATION: ECOG PERFORMANCE STATUS: 0 - Asymptomatic  Vitals:   10/25/16 0955  BP: (!) 137/57  Pulse: 61  Resp: 20  Temp: 97.6 F (36.4 C)   Filed Weights   10/25/16 0955  Weight: 174 lb 6.4 oz (79.1 kg)    GENERAL: Well-nourished well-developed; Alert, no distress and comfortable.  Alone.  EYES: no pallor or icterus OROPHARYNX: no thrush or ulceration; good dentition  NECK: supple, no masses felt LYMPH:  no palpable lymphadenopathy in the cervical, axillary or inguinal regions LUNGS: clear to auscultation and  No wheeze or crackles HEART/CVS: regular rate & rhythm and no murmurs; No lower extremity edema ABDOMEN: abdomen soft, tender in LLQ with normal bowel  sounds Musculoskeletal:no cyanosis of digits and no clubbing  PSYCH: alert & oriented x 3 with fluent speech NEURO: no focal motor/sensory deficits SKIN:  no rashes or significant lesions Right and left BREAST exam [in the presence of nurse]- no unusual skin changes or dominant masses felt. Surgical scars noted. ? Thickening under the skin incision.    LABORATORY DATA:  I have reviewed the data as listed Lab Results  Component Value Date   WBC 2.7 (L) 10/25/2016   HGB 11.1 (L) 10/25/2016   HCT 32.5 (L) 10/25/2016   MCV 86.6 10/25/2016   PLT 101 (L) 10/25/2016    Recent Labs  02/15/16 0956 05/15/16 1340 10/25/16 0941  NA 138 138 138  K 3.8 3.9 4.4  CL 102 104 102  CO2 '29 28 27  '$ GLUCOSE 137* 242* 209*  BUN '13 11 15  '$ CREATININE 0.78 0.61 0.57  CALCIUM 8.7* 9.4 9.2  GFRNONAA >60 >60 >60  GFRAA >60 >60 >60  PROT 6.9 7.2 7.3  ALBUMIN 3.7 3.9 3.7  AST 71* 45* 70*  ALT 45 26 37  ALKPHOS 54 57 75  BILITOT 0.5 0.5 0.5    RADIOGRAPHIC STUDIES: I have personally reviewed the radiological images as listed and agreed with the findings in the report. No results found.  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 recommend discontinuation of Arimidex because of joint pains/muscle cramps. Recommend starting letrozole. Again discussed the potential side effects. New prescription given.  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 [White count-2.7 ANC- 1.3; hemoglobin 11 platelets 101]- slightly worse from previous. Abdominal CT negative for any cirrhosis or splenomegaly. Recommend bone marrow biopsy for further evaluation. Awaiting on O03 folic acid from today.   # recommend follow-up in few days after bone marrow biopsy. Will order /HIv-hepatitis  panel at follow up visit.      Cammie Sickle, MD 10/25/2016 8:28 PM

## 2016-10-25 NOTE — Assessment & Plan Note (Addendum)
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 recommend discontinuation of Arimidex because of joint pains/muscle cramps. Recommend starting letrozole. Again discussed the potential side effects. New prescription given.  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 [White count-2.7 ANC- 1.3; hemoglobin 11 platelets 101]- slightly worse from previous. Abdominal CT negative for any cirrhosis or splenomegaly. Recommend bone marrow biopsy for further evaluation. Awaiting on Y77 folic acid from today.   # recommend follow-up in few days after bone marrow biopsy. Will order /HIv-hepatitis panel at follow up visit.

## 2016-10-25 NOTE — Progress Notes (Signed)
Patient here for DCIS. She has no complaints.  RN Chaperoned provider with Breast Exam

## 2016-10-28 ENCOUNTER — Ambulatory Visit: Payer: Medicare Other | Admitting: Physical Therapy

## 2016-10-31 ENCOUNTER — Encounter: Payer: Self-pay | Admitting: Obstetrics and Gynecology

## 2016-10-31 ENCOUNTER — Ambulatory Visit (INDEPENDENT_AMBULATORY_CARE_PROVIDER_SITE_OTHER): Payer: Medicare Other | Admitting: Obstetrics and Gynecology

## 2016-10-31 ENCOUNTER — Other Ambulatory Visit: Payer: Self-pay | Admitting: Radiology

## 2016-10-31 VITALS — BP 128/63 | HR 68 | Temp 98.6°F | Ht 63.0 in | Wt 175.0 lb

## 2016-10-31 DIAGNOSIS — L408 Other psoriasis: Secondary | ICD-10-CM | POA: Diagnosis not present

## 2016-10-31 DIAGNOSIS — N763 Subacute and chronic vulvitis: Secondary | ICD-10-CM

## 2016-10-31 DIAGNOSIS — R300 Dysuria: Secondary | ICD-10-CM | POA: Diagnosis not present

## 2016-10-31 DIAGNOSIS — L308 Other specified dermatitis: Secondary | ICD-10-CM

## 2016-10-31 DIAGNOSIS — L292 Pruritus vulvae: Secondary | ICD-10-CM

## 2016-10-31 LAB — POCT URINALYSIS DIPSTICK
Glucose, UA: NEGATIVE
Ketones, UA: NEGATIVE
Leukocytes, UA: NEGATIVE
Nitrite, UA: NEGATIVE
Protein, UA: NEGATIVE
Spec Grav, UA: 1.015 (ref 1.010–1.025)
Urobilinogen, UA: 0.2 E.U./dL
pH, UA: 7.5 (ref 5.0–8.0)

## 2016-10-31 MED ORDER — HYDROXYZINE HCL 25 MG PO TABS: ORAL_TABLET | ORAL | 2 refills | Status: DC

## 2016-10-31 NOTE — Patient Instructions (Signed)
1. Vulvar biopsy is obtained today (2) 2. Start hydroxyzine at night for help with itching and sleep 3. Return in 1 week for follow-up  VULVAR BIOPSY POST-PROCEDURE INSTRUCTIONS  1. You may take Ibuprofen, Aleve or Tylenol for pain if needed.    2. You may have a small amount of spotting.  You should wear a mini pad for the next few days.  3. You may use some topical Neosporin ointment if you would like (over the counter is fine).  4. You need to call if you have redness around the biopsy site, if there is any unusual draining, if the bleeding is heavy, or if you are concerned.  5. Shower or bathe as normal  6. We will discuss the results at your follow-up appointment next week.

## 2016-10-31 NOTE — Progress Notes (Signed)
Chief complaint: 1. Vulvar itching, worse 2. History of spongiotic psoriasiform dermatitis 3. Dysuria  Patient presents for follow-up. She is using Temovate ointment 0.05% topically daily. She is using [employer]. She is bloated drying her perineum after bathing. Symptoms have progressively worsened with increased itching and burning.  Patient is to have a bone marrow biopsy next week to rule out leukemia.  Past history, past surgical history, problem list, medications, and allergies are reviewed  OBJECTIVE: BP 128/63   Pulse 68   Temp 98.6 F (37 C)   Ht 5' 3"  (1.6 m)   Wt 175 lb (79.4 kg)   LMP  (LMP Unknown)   BMI 31.00 kg/m  Pleasant elderly female in no acute distress. Alert and oriented. Pelvic exam: External genitalia-vulvar hyperemia, confluence;  perianal region notable for hyperemia without leukoplakia; lateral margins of hyperemia appear thickened Vagina-not examined  PROCEDURE: Vulvar biopsy 2 Verbal consent is obtained. Patient was placed in the dorsal lithotomy position. The areas of abnormality which are to be biopsied included the right labia majora and the left perianal region. These areas were cleaned with Betadine swabs. One percent lidocaine without epinephrine is injected locally into each of the biopsy sites a total of 6 cc being utilized. Once adequate anesthesia is obtained, 3.5 mm punch biopsy is obtained from each site. 3-0 Vicryl repeat suture is placed for hemostasis. Blood loss is minimal. Complications are none. Patient tolerated the procedure well. Post biopsy instructions are given.  ASSESSMENT: 1. History of spongiotic psoriasiform dermatitis, symptoms worsening despite daily topical therapy with Temovate ointment 0.05% 2. Dysuria  PLAN: 1. Vulvar biopsy 2 noted 2. Urinalysis with urine culture 3. Post biopsy instructions were given 4. Return in 1 week for follow-up and further management planning 5. Hydroxyzine is prescribed at night to help  with itching and sleep  A total of 15 minutes were spent face-to-face with the patient during this encounter and over half of that time dealt with counseling and coordination of care.  Brayton Mars, MD  Note: This dictation was prepared with Dragon dictation along with smaller phrase technology. Any transcriptional errors that result from this process are unintentional.

## 2016-10-31 NOTE — Addendum Note (Signed)
Addended by: Elouise Munroe on: 10/31/2016 02:34 PM   Modules accepted: Orders

## 2016-11-01 ENCOUNTER — Other Ambulatory Visit: Payer: Self-pay | Admitting: Radiology

## 2016-11-02 LAB — URINE CULTURE: Organism ID, Bacteria: NO GROWTH

## 2016-11-04 ENCOUNTER — Ambulatory Visit: Payer: Medicare Other | Admitting: Physical Therapy

## 2016-11-04 ENCOUNTER — Other Ambulatory Visit (HOSPITAL_COMMUNITY)
Admission: RE | Admit: 2016-11-04 | Discharge: 2016-11-04 | Disposition: A | Discharge status: Home / Self Care | Payer: Medicare Other | Source: Ambulatory Visit | Attending: Internal Medicine | Admitting: Internal Medicine

## 2016-11-04 ENCOUNTER — Ambulatory Visit
Admission: RE | Admit: 2016-11-04 | Discharge: 2016-11-04 | Disposition: A | Discharge status: Home / Self Care | Payer: Medicare Other | Source: Ambulatory Visit | Attending: Internal Medicine | Admitting: Internal Medicine

## 2016-11-04 DIAGNOSIS — I110 Hypertensive heart disease with heart failure: Secondary | ICD-10-CM | POA: Insufficient documentation

## 2016-11-04 DIAGNOSIS — Z79899 Other long term (current) drug therapy: Secondary | ICD-10-CM | POA: Insufficient documentation

## 2016-11-04 DIAGNOSIS — D61818 Other pancytopenia: Secondary | ICD-10-CM | POA: Diagnosis present

## 2016-11-04 DIAGNOSIS — D696 Thrombocytopenia, unspecified: Secondary | ICD-10-CM | POA: Insufficient documentation

## 2016-11-04 DIAGNOSIS — E785 Hyperlipidemia, unspecified: Secondary | ICD-10-CM | POA: Insufficient documentation

## 2016-11-04 DIAGNOSIS — Z7982 Long term (current) use of aspirin: Secondary | ICD-10-CM | POA: Diagnosis not present

## 2016-11-04 DIAGNOSIS — E119 Type 2 diabetes mellitus without complications: Secondary | ICD-10-CM | POA: Insufficient documentation

## 2016-11-04 DIAGNOSIS — Z7984 Long term (current) use of oral hypoglycemic drugs: Secondary | ICD-10-CM | POA: Diagnosis not present

## 2016-11-04 DIAGNOSIS — E039 Hypothyroidism, unspecified: Secondary | ICD-10-CM | POA: Insufficient documentation

## 2016-11-04 DIAGNOSIS — Z853 Personal history of malignant neoplasm of breast: Secondary | ICD-10-CM | POA: Insufficient documentation

## 2016-11-04 DIAGNOSIS — I509 Heart failure, unspecified: Secondary | ICD-10-CM | POA: Insufficient documentation

## 2016-11-04 LAB — CBC WITH DIFFERENTIAL/PLATELET
Basophils Absolute: 0 10*3/uL (ref 0–0.1)
Basophils Relative: 1 %
Eosinophils Absolute: 0.1 10*3/uL (ref 0–0.7)
Eosinophils Relative: 3 %
HCT: 35.1 % (ref 35.0–47.0)
Hemoglobin: 11.8 g/dL — ABNORMAL LOW (ref 12.0–16.0)
Lymphocytes Relative: 42 %
Lymphs Abs: 1.2 10*3/uL (ref 1.0–3.6)
MCH: 29.7 pg (ref 26.0–34.0)
MCHC: 33.8 g/dL (ref 32.0–36.0)
MCV: 88.1 fL (ref 80.0–100.0)
Monocytes Absolute: 0.3 10*3/uL (ref 0.2–0.9)
Monocytes Relative: 10 %
Neutro Abs: 1.3 10*3/uL — ABNORMAL LOW (ref 1.4–6.5)
Neutrophils Relative %: 44 %
Platelets: 92 10*3/uL — ABNORMAL LOW (ref 150–440)
RBC: 3.98 MIL/uL (ref 3.80–5.20)
RDW: 15 % — ABNORMAL HIGH (ref 11.5–14.5)
WBC: 2.9 10*3/uL — ABNORMAL LOW (ref 3.6–11.0)

## 2016-11-04 LAB — PATHOLOGY

## 2016-11-04 LAB — APTT: aPTT: 32 seconds (ref 24–36)

## 2016-11-04 LAB — PROTIME-INR
INR: 1.06
Prothrombin Time: 13.7 seconds (ref 11.4–15.2)

## 2016-11-04 MED ORDER — SODIUM CHLORIDE 0.9 % IV SOLN
INTRAVENOUS | Status: DC
  Administered 2016-11-04: 09:00:00 via INTRAVENOUS

## 2016-11-04 MED ORDER — MIDAZOLAM HCL 5 MG/5ML IJ SOLN
INTRAMUSCULAR | Status: AC
  Filled 2016-11-04: qty 5

## 2016-11-04 MED ORDER — MIDAZOLAM HCL 5 MG/5ML IJ SOLN
INTRAMUSCULAR | Status: AC | PRN
  Administered 2016-11-04: 1 mg via INTRAVENOUS

## 2016-11-04 MED ORDER — FENTANYL CITRATE (PF) 100 MCG/2ML IJ SOLN
INTRAMUSCULAR | Status: AC
  Filled 2016-11-04: qty 2

## 2016-11-04 MED ORDER — FENTANYL CITRATE (PF) 100 MCG/2ML IJ SOLN
INTRAMUSCULAR | Status: AC | PRN
  Administered 2016-11-04 (×2): 12.5 ug via INTRAVENOUS
  Administered 2016-11-04: 25 ug via INTRAVENOUS

## 2016-11-04 MED ORDER — MIDAZOLAM HCL 2 MG/2ML IJ SOLN
INTRAMUSCULAR | Status: AC | PRN
  Administered 2016-11-04: 0.5 mg via INTRAVENOUS

## 2016-11-04 MED ORDER — HEPARIN SOD (PORK) LOCK FLUSH 100 UNIT/ML IV SOLN
INTRAVENOUS | Status: AC
  Filled 2016-11-04: qty 5

## 2016-11-04 NOTE — Consult Note (Signed)
Chief Complaint: Patient was seen in consultation today for pacytopenia at the request of Brahmanday,Govinda R  Referring Physician(s): Cammie Sickle  Supervising Physician: Arne Cleveland  Patient Status: [facility] - Out-pt  History of Present Illness: Annette Foster is a 76 y.o. 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 [White count-2.7 ANC- 1.3; hemoglobin 11 platelets 101]- slightly worse from previous. Abdominal CT negative for any cirrhosis or splenomegaly. Request for bone marrow biopsy for further evaluation.    Past Medical History:  Diagnosis Date  . Abdominal pain   . Allergy   . Cancer [facility]) 2017   breast cancer- Left  . Cataract   . CHF (congestive heart failure) ([place])   . Collagenous colitis   . Diabetes mellitus without complication ([place])   . Diarrhea   . Diverticulosis   . Fibrocystic breast   . GERD (gastroesophageal reflux disease)   . Heart murmur   . Hyperlipidemia   . Hypertension   . Hypothyroidism   . IBS (irritable bowel syndrome)   . IBS (irritable bowel syndrome)   . IDA (iron deficiency anemia)   . Personal history of radiation therapy   . PONV (postoperative nausea and vomiting)   . Sleep apnea    C-Pap    Past Surgical History:  Procedure Laterality Date  . ABDOMINAL HYSTERECTOMY     tah bso  . ABDOMINAL SURGERY    . APPENDECTOMY    . BREAST BIOPSY Bilateral    negative  . 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 BREAST BIOPSY Left 05/27/2016   neg  . CARDIAC SURGERY    . CATARACT EXTRACTION Right   . CHOLECYSTECTOMY    . COLONOSCOPY WITH PROPOFOL N/A 03/11/2016   Procedure: COLONOSCOPY WITH PROPOFOL;  Surgeon: Manya Silvas, MD;  Location: [facility] ENDOSCOPY;  Service: Endoscopy;  Laterality: N/A;  . ESOPHAGOGASTRODUODENOSCOPY (EGD) WITH  PROPOFOL N/A 03/11/2016   Procedure: ESOPHAGOGASTRODUODENOSCOPY (EGD) WITH PROPOFOL;  Surgeon: Manya Silvas, MD;  Location: [facility] ENDOSCOPY;  Service: Endoscopy;  Laterality: N/A;  . JOINT REPLACEMENT Left    TKR  . left sinusplasty     . MASTECTOMY, PARTIAL Left 10/17/2015   Procedure: MASTECTOMY PARTIAL REVISION;  Surgeon: Leonie Green, MD;  Location: [facility] ORS;  Service: General;  Laterality: Left;  . PARTIAL MASTECTOMY WITH NEEDLE LOCALIZATION Left 09/29/2015   Procedure: PARTIAL MASTECTOMY WITH NEEDLE LOCALIZATION;  Surgeon: Leonie Green, MD;  Location: [facility] ORS;  Service: General;  Laterality: Left;  . TOTAL ABDOMINAL HYSTERECTOMY W/ BILATERAL SALPINGOOPHORECTOMY      Allergies: Fentanyl; Codeine; Demeclocycline; Demerol [meperidine]; Hydrocodone-acetaminophen; Other; Oxycodone; Pentazocine; and Tetracyclines & related  Medications: Prior to Admission medications   Medication Sig Start Date End Date Taking? Authorizing Provider  aspirin 81 MG tablet Take 81 mg by mouth at bedtime.   Yes [provider]  budesonide-formoterol (SYMBICORT) 80-4.5 MCG/ACT inhaler Inhale 2 puffs into the lungs 2 (two) times daily as needed (shortness of breath).    Yes [provider]  cholecalciferol (VITAMIN D) 1000 units tablet Take 2,000 Units by mouth daily.    Yes [provider]  Cyanocobalamin (B-12 COMPLIANCE INJECTION) 1000 MCG/ML KIT Inject 1 Syringe as directed every 30 (thirty) days.   Yes [provider]  escitalopram (LEXAPRO) 5 MG tablet Take 5 mg by mouth at bedtime.  Yes [provider]  esomeprazole (NEXIUM) 40 MG capsule Take 40 mg by mouth daily at 12 noon.  02/08/16  Yes [provider]  fenofibrate 160 MG tablet Take 160 mg by mouth at bedtime.   Yes [provider]  fexofenadine (ALLEGRA) 180 MG tablet Take 180 mg by mouth at bedtime.    Yes [provider]  fluticasone (FLONASE) 50 MCG/ACT nasal  spray Place 1 spray into the nose daily as needed for allergies.    Yes [provider]  furosemide (LASIX) 20 MG tablet Take 20-40 mg by mouth every other day. Alternate between 1 a day and 2 a day   Yes [provider]  gabapentin (NEURONTIN) 300 MG capsule Take 300 mg by mouth daily. 09/07/16 10/25/36 Yes [provider]  glimepiride (AMARYL) 2 MG tablet Take 2 mg by mouth at bedtime.   Yes [provider]  hydrOXYzine (ATARAX/VISTARIL) 25 MG tablet 1 or 2 tablets at night for itching and sleep 10/31/16  Yes Defrancesco, Alanda Slim, MD  Hyoscyamine Sulfate 0.375 MG TBCR Take 0.375 mg by mouth 2 (two) times daily.    Yes [provider]  lansoprazole (PREVACID) 30 MG capsule Take 30 mg by mouth daily as needed (reflux).    Yes [provider]  letrozole (FEMARA) 2.5 MG tablet Take 1 tablet (2.5 mg total) by mouth daily. Once a day. 10/25/16  Yes Cammie Sickle, MD  levothyroxine (SYNTHROID, LEVOTHROID) 75 MCG tablet Take 75 mcg by mouth daily before breakfast.    Yes [provider]  magnesium oxide (MAG-OX) 400 MG tablet Take by mouth.   Yes [provider]  metFORMIN (GLUCOPHAGE-XR) 750 MG 24 hr tablet Take 750 mg by mouth 2 (two) times daily.    Yes [provider]  mirabegron ER (MYRBETRIQ) 50 MG TB24 tablet Take 1 tablet (50 mg total) by mouth daily. 08/19/16  Yes Ardis Hughs, MD  MISC NATURAL PRODUCTS PO Take by mouth.   Yes [provider]  montelukast (SINGULAIR) 10 MG tablet Take 10 mg by mouth at bedtime.    Yes [provider]  olopatadine (PATANOL) 0.1 % ophthalmic solution 1 drop 2 (two) times daily.   Yes [provider]  potassium chloride SA (K-DUR,KLOR-CON) 20 MEQ tablet Take by mouth.   Yes [provider]  simvastatin (ZOCOR) 20 MG tablet Take 20 mg by mouth at bedtime.   Yes [provider]  valsartan (DIOVAN) 160 MG tablet Take 160 mg by mouth  every morning.    Yes [provider]  saccharomyces boulardii (FLORASTOR) 250 MG capsule Take 250 mg by mouth 2 (two) times daily.     [provider]     Family History  Problem Relation Age of Onset  . Breast cancer Paternal Grandmother   . Colon cancer Father   . Diabetes Sister   . Diabetes Brother   . Heart disease Brother   . Prostate cancer Brother   . Colon cancer Maternal Uncle   . Prostate cancer Brother   . Bladder Cancer Brother   . Ovarian cancer Neg Hx   . Kidney cancer Neg Hx     Social History   Social History  . Marital status: Married    Spouse name: N/A  . Number of children: N/A  . Years of education: N/A   Social History Main Topics  . Smoking status: Never Smoker  . Smokeless tobacco: Never Used  . Alcohol  use No  . Drug use: No  . Sexual activity: Not Currently    Birth control/ protection: Surgical   Other Topics Concern  . None   Social History Narrative  . None    ECOG Status: 1 - Symptomatic but completely ambulatory  Review of Systems: A 12 point ROS discussed and pertinent positives are indicated in the HPI above.  All other systems are negative.  Review of Systems  Vital Signs: BP 114/64   Pulse (!) 59   Temp 98.7 F (37.1 C) (Oral)   Resp 17   LMP  (LMP Unknown)   SpO2 96%   Physical Exam Constitutional: Oriented to person, place, and time. Well-developed and well-nourished. No distress.  HENT:  Head: Normocephalic and atraumatic.   Eyes: Conjunctivae and EOM are normal. Right eye exhibits no discharge. Left eye exhibits no discharge. No scleral icterus.  Neck: No JVD present.  Pulmonary/Chest: Effort normal. No stridor. No respiratory distress. Cardiac normal  rate and rhythm. Lunmgs clear to auscultation bilaterally. No rales or wheezes.  Abdomen: soft, non distended Neurological:  alert and oriented to person, place, and time.  Skin: Skin is warm and dry.  not diaphoretic.  Psychiatric:   normal  mood and affect.   behavior is normal. Judgment and thought content normal.   Imaging:  Labs:  CBC:  Recent Labs  05/15/16 1340 08/16/16 1341 10/25/16 0941 11/04/16 0816  WBC 3.0* 3.3* 2.7* 2.9*  HGB 11.0* 11.1* 11.1* 11.8*  HCT 32.4* 33.5* 32.5* 35.1  PLT 105* 100* 101* 92*    COAGS:  Recent Labs  11/04/16 0816  INR 1.06  APTT 32    BMP:  Recent Labs  02/15/16 0956 05/15/16 1340 10/25/16 0941  NA 138 138 138  K 3.8 3.9 4.4  CL 102 104 102  CO2 _0 GLUCOSE 137* 242* 209*  BUN _1 CALCIUM 8.7* 9.4 9.2  CREATININE 0.78 0.61 0.57  GFRNONAA >60 >60 >60  GFRAA >60 >60 >60    LIVER FUNCTION TESTS:  Recent Labs  02/15/16 0956 05/15/16 1340 10/25/16 0941  BILITOT 0.5 0.5 0.5  AST 71* 45* 70*  ALT 45 26 37  ALKPHOS 54 57 75  PROT 6.9 7.2 7.3  ALBUMIN 3.7 3.9 3.7    TUMOR MARKERS: No results for input(s): AFPTM, CEA, CA199, CHROMGRNA in the last 8760 hours.  Assessment and Plan:  Pancytopenia of uncertain etiology. Request for bone marrow biopsy is clinically indicated.  Discussed with pt and accompanying friend the procedure; alternatives; benefits; risks including (but not limited to) bleeding, organ or nerve damage,   infection. All questions answered. Pt understands and agrees to proceed.  Plan R iliac bone marrow biopsy under CT guidance today.  Thank you for this interesting consult.  I greatly enjoyed meeting Annette Foster and look forward to participating in their care.  A copy of this report was sent to the requesting provider on this date.  Electronically Signed: Janalee Dane, MD 11/04/2016, 9:33 AM   I spent a total of  30 Minutes   in face to face in clinical consultation, greater than 50% of which was counseling/coordinating care for pancytopenia requiring bone marrow biopsy.

## 2016-11-04 NOTE — Procedures (Signed)
  Procedure:   R iliac bone marrow biopsy CT guidance Preprocedure diagnosis:  Pancytopenia Postprocedure diagnosis:  same EBL:     minimal Complications:   none immediate  See full dictation in [employer].  Dillard Cannon MD Main # [phone] Pager  [phone]

## 2016-11-05 ENCOUNTER — Telehealth: Payer: Self-pay

## 2016-11-05 MED ORDER — NYSTATIN-TRIAMCINOLONE 100000-0.1 UNIT/GM-% EX CREA: 1.0000 "application " | TOPICAL_CREAM | Freq: Two times a day (BID) | CUTANEOUS | 1 refills | Status: DC

## 2016-11-05 NOTE — Telephone Encounter (Signed)
Pt aware per vm bx results and med erx.

## 2016-11-05 NOTE — Telephone Encounter (Signed)
-----   Message from Annette Mars, MD sent at 11/05/2016  8:33 AM EDT ----- Please notify - Abnormal Labs Please: Nystatin/triamcinolone pseudo-night cream to be applied topically to the vulva and perianal region twice a day for 30 days

## 2016-11-06 ENCOUNTER — Encounter: Payer: Medicare Other | Admitting: Obstetrics and Gynecology

## 2016-11-06 ENCOUNTER — Ambulatory Visit (INDEPENDENT_AMBULATORY_CARE_PROVIDER_SITE_OTHER): Payer: Medicare Other | Admitting: Obstetrics and Gynecology

## 2016-11-06 ENCOUNTER — Encounter: Payer: Self-pay | Admitting: Obstetrics and Gynecology

## 2016-11-06 VITALS — BP 132/65 | HR 67 | Ht 63.0 in | Wt 173.2 lb

## 2016-11-06 DIAGNOSIS — B373 Candidiasis of vulva and vagina: Secondary | ICD-10-CM | POA: Diagnosis not present

## 2016-11-06 DIAGNOSIS — N763 Subacute and chronic vulvitis: Secondary | ICD-10-CM

## 2016-11-06 DIAGNOSIS — B3731 Acute candidiasis of vulva and vagina: Secondary | ICD-10-CM | POA: Insufficient documentation

## 2016-11-06 MED ORDER — FLUCONAZOLE 150 MG PO TABS: 150.0000 mg | ORAL_TABLET | ORAL | 0 refills | Status: DC

## 2016-11-06 NOTE — Progress Notes (Signed)
Chief complaint: 1. Chronic vulvitis 2. Follow-up on vulvar biopsies  Audyn presents for 1 week follow-up after biopsies for reevaluation of chronic vulvitis. Biopsy sites are tender. Patient also underwent a bilateral biopsy of right hip which went well this past week.  Pathology: Diagnosis:  PERIANAL, LEFT:  BENIGN SKIN TISSUE WITH HYPERKERATOSIS, PARAKERATOSIS, ACANTHOSIS,  ACUTE AND CHRONIC INFLAMMATION AND CANDIDIASIS.  SPECIAL STAIN GMSF DEMONSTRATE FUNGAL HYPHE.  NAP/11/04/2016   Diagnosis:  VULVA, RIGHT LABIA MAJORA, BIOPSY:  BENIGN VULVAR TISSUE WITH HYPERKERATOSIS, PARAKERATOSIS, ACANTHOSIS,  CHRONIC INFLAMMATION AND CANDIDIASIS.SPECIAL STAIN GMSF DEMONSTRATE  FUNGAL HYPHE.  NAP/11/04/2016   OBJECTIVE: BP 132/65   Pulse 67   Ht 5' 3"  (1.6 m)   Wt 173 lb 3.2 oz (78.6 kg)   LMP  (LMP Unknown)   BMI 30.68 kg/m  Pleasant female in no acute distress Pelvic exam: External genitalia-vulvar hyperemia, confluence; perianal region notable for hyperemiawithout leukoplakia; lateral margins of hyperemia appear thickened. Right perianal and left labia majora biopsy sites are healing well.  ASSESSMENT: 1. Chronic vulvitis with biopsy confirming yeast infection  PLAN: 1. Discontinue Temovate ointment at this time 2. Nystatin/triamcinolone  cream topically to the vulva twice a day for the next 30 days 3. Diflucan 150 mg orally every 3 days 4 doses 4. Return in 4 weeks for follow-up  A total of 15 minutes were spent face-to-face with the patient during this encounter and over half of that time dealt with counseling and coordination of care.  Brayton Mars, MD  Note: This dictation was prepared with Dragon dictation along with smaller phrase technology. Any transcriptional errors that result from this process are unintentional.

## 2016-11-06 NOTE — Patient Instructions (Signed)
1. Continue with nystatin/triamcinolone cream topically to the vulva twice a day for the next 30 days 2. Diflucan 150 mg orally every 3 days 4 doses 3. Hold off on taking Zocor for the next 2 weeks until the Diflucan therapy is completed 4. Return in 4 weeks for follow-up

## 2016-11-11 ENCOUNTER — Telehealth: Payer: Self-pay | Admitting: *Deleted

## 2016-11-11 ENCOUNTER — Ambulatory Visit: Payer: Medicare Other | Attending: Urology | Admitting: Physical Therapy

## 2016-11-11 DIAGNOSIS — R278 Other lack of coordination: Secondary | ICD-10-CM | POA: Insufficient documentation

## 2016-11-11 DIAGNOSIS — G8929 Other chronic pain: Secondary | ICD-10-CM | POA: Diagnosis present

## 2016-11-11 DIAGNOSIS — M791 Myalgia, unspecified site: Secondary | ICD-10-CM

## 2016-11-11 DIAGNOSIS — R279 Unspecified lack of coordination: Secondary | ICD-10-CM | POA: Diagnosis present

## 2016-11-11 DIAGNOSIS — R2689 Other abnormalities of gait and mobility: Secondary | ICD-10-CM

## 2016-11-11 DIAGNOSIS — M25512 Pain in left shoulder: Secondary | ICD-10-CM | POA: Diagnosis present

## 2016-11-11 NOTE — Patient Instructions (Signed)
     Arm swings 10 reps x 2 throughout the day to relax the back muscles

## 2016-11-11 NOTE — Telephone Encounter (Signed)
Called to request BM Bx results

## 2016-11-11 NOTE — Telephone Encounter (Signed)
Informed patient that results are not back yet

## 2016-11-12 NOTE — Therapy (Addendum)
[place] MAIN [facility] SERVICES [address] [place], [place], [address] Phone: [phone]   Fax:  [phone]  Physical Therapy Evaluation  Patient Details  Name: Annette Foster MRN: [id number] Date of Birth: 1941-01-09 No Data Recorded  Encounter Date: 11/11/2016      PT End of Session - 11/12/16 2311    Visit Number 1   Number of Visits 12   Date for PT Re-Evaluation 02/03/17   Authorization Type g code   PT Start Time 1512   PT Stop Time 1610   PT Time Calculation (min) 58 min   Activity Tolerance No increased pain;Patient tolerated treatment well   Behavior During Therapy [facility] for tasks assessed/performed      Past Medical History:  Diagnosis Date  . Abdominal pain   . Allergy   . Cancer [facility]) 2017   breast cancer- Left  . Cataract   . CHF (congestive heart failure) ([place])   . Collagenous colitis   . Diabetes mellitus without complication ([place])   . Diarrhea   . Diverticulosis   . Fibrocystic breast   . GERD (gastroesophageal reflux disease)   . Heart murmur   . Hyperlipidemia   . Hypertension   . Hypothyroidism   . IBS (irritable bowel syndrome)   . IBS (irritable bowel syndrome)   . IDA (iron deficiency anemia)   . Personal history of radiation therapy   . PONV (postoperative nausea and vomiting)   . Sleep apnea    C-Pap    Past Surgical History:  Procedure Laterality Date  . ABDOMINAL HYSTERECTOMY     tah bso  . ABDOMINAL SURGERY    . APPENDECTOMY    . BREAST BIOPSY Bilateral    negative  . 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 BREAST BIOPSY Left 05/27/2016   neg  . CARDIAC SURGERY    . CATARACT EXTRACTION Right   . CHOLECYSTECTOMY    . COLONOSCOPY WITH PROPOFOL N/A 03/11/2016   Procedure: COLONOSCOPY WITH PROPOFOL;  Surgeon: Manya Silvas, MD;  Location[facility] ENDOSCOPY;  Service: Endoscopy;  Laterality: N/A;  . ESOPHAGOGASTRODUODENOSCOPY (EGD) WITH PROPOFOL N/A 03/11/2016    Procedure: ESOPHAGOGASTRODUODENOSCOPY (EGD) WITH PROPOFOL;  Surgeon: Manya Silvas, MD;  Location: [facility] ENDOSCOPY;  Service: Endoscopy;  Laterality: N/A;  . JOINT REPLACEMENT Left    TKR  . left sinusplasty     . MASTECTOMY, PARTIAL Left 10/17/2015   Procedure: MASTECTOMY PARTIAL REVISION;  Surgeon: Leonie Green, MD;  Location: [facility] ORS;  Service: General;  Laterality: Left;  . PARTIAL MASTECTOMY WITH NEEDLE LOCALIZATION Left 09/29/2015   Procedure: PARTIAL MASTECTOMY WITH NEEDLE LOCALIZATION;  Surgeon: Leonie Green, MD;  Location: [facility] ORS;  Service: General;  Laterality: Left;  . TOTAL ABDOMINAL HYSTERECTOMY W/ BILATERAL SALPINGOOPHORECTOMY      There were no vitals filed for this visit.       Subjective Assessment - 11/11/16 1522    Subjective 1) pelvic floor issues  A)  Pt noticed burning and stinging after urination that started after her breast CA treatment in Nov 2017. After urination, pt had also felt itchiness. Pt also exeriences blood sometimes when she has burning sensation. Pt has experienced this for the past year. Her gynecologist had performed biopsies 3 different times and pt was Dx with chronic vulvities and chronic infections. This last time during this past month, her biopsy showed yeast infection in the vaginal and peri-anal area, chronic inflammation,  and chronic vulvitis. Pt states these  Sx "drive her crazy". Pt can not sleep at night. Pt takes antibiotics and a cream for this yeast infection.  It has bothered her less compared to the past in a long time.  The medications are helping.  Dietary intake: no sugars ( diabetic but her blood levels are higher than what has been), pt loves breads but she tries to limit to 2 pieces in the morning and 3 -4 crackers in the evening, veggies.   B) Bowel movements ( [place] Stool Type 5-7) occur 10-12 x/ day and she takes probioics.  Fluid intake: 8 -16 floz of water, no soda, 1 cup decaf in the morning, no teas).     C) Pt reports urinary leakage sometimes with coughing and sneezing.    2) B upper back pain that hurts when she turns over during the night. 8/10 pain.          Pertinent History Pt is waiting on a bone marrow biopsy because her oncologist was concerned about the recent epsiodes of night sweats and blood count drop. Pt will be calling back her clinic this week. Pt is also following up with her PCP Dr. Hall Busing about her blood sugar levels which has been high even though she has cut back on her sugars.   Pt has had heart surgery in 1972. Pt exercises with the cardiorehab program.              [facility] PT Assessment - 11/11/16 1605      Coordination   Gross Motor Movements are Fluid and Coordinated --  chest breathing, pelvic floor dyscoordination     Palpation   Spinal mobility increased lumbar lordosis, increased upper paraspinal mm tensions  limited mobilty in rotation and sidebend      abdominal straining with cue for bowel movement/ pelvic floor contraction  poor deep core coordination and strength Bed mobility with half crunch / straining abdominal mm      Objective measurements completed on examination: See above findings.          [place] Adult PT Treatment/Exercise - 11/12/16 2307      Therapeutic Activites    Therapeutic Activities --  see pt instructions                     PT Long Term Goals - 11/12/16 2304      PT LONG TERM GOAL #1   Title Pt will increase her score on PSFS for "turning over bed" from 0 to > 5pts, "walking an hour from 1 pt to > 3 pts, and excerising an hour form 1 pt to > 3 pts in order to return to activities   Time 12   Period Weeks   Status New     PT LONG TERM GOAL #2   Title Pt will demo proper coordination of deep core mm to improve pelvic floor function   Time 4   Period Weeks   Status New     PT LONG TERM GOAL #3   Title Pt will demo decreased paraspinal mm tensions and increased spinal mobility in order to  promote deep core coordination and function    Time 2   Period Weeks   Status New                Plan - 11/12/16 2312    Clinical Impression Statement Pt is a 76 yo female who complains of pelvic floor issues  started after her breast CA treatment in Nov 2017: burning and stinging after urination, frequent bowel movements with looser forming stools.  These deficits impact her QOL and ADLs. Pt also reports SUI and CLBP. Pt's clinical presentations include poor body mechanics that place strain on her abdomina/ pelvic floor mm, dyscoordination of pelvic floor mm, and limited spinal mobility. Following Tx, pt was educated on proper toileting posture.    Clinical Presentation Evolving   Clinical Decision Making Moderate   Rehab Potential Good   PT Frequency 1x / week   PT Duration 12 weeks   PT Treatment/Interventions Therapeutic activities;Therapeutic exercise;Moist Heat;Traction;Stair training;Gait training;Neuromuscular re-education;Balance training;Patient/family education;Manual techniques;Manual lymph drainage;Energy conservation;Passive range of motion;Functional mobility training   Consulted and Agree with Plan of Care Patient      Patient will benefit from skilled therapeutic intervention in order to improve the following deficits and impairments:  Abnormal gait, Increased muscle spasms, Postural dysfunction, Improper body mechanics, Decreased scar mobility, Decreased range of motion, Decreased safety awareness, Decreased strength, Decreased endurance, Decreased balance, Decreased mobility, Difficulty walking, Decreased coordination, Hypomobility  Visit Diagnosis: Unspecified lack of coordination  Myalgia  Other abnormalities of gait and mobility      G-Codes - 10-Dec-2016 2306/04/05    Functional Assessment Tool Used (Outpatient Only) PSFS score and clincial judgement   Functional Limitation Mobility: Walking and moving around   Mobility: Walking and Moving Around Current Status  [phone]) At least 40 percent but less than 60 percent impaired, limited or restricted   Mobility: Walking and Moving Around Goal Status [phone]) At least 20 percent but less than 40 percent impaired, limited or restricted       Problem List Patient Active Problem List   Diagnosis Date Noted  . 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 Status post hysterectomy 06/20/2016  . H/O vaginal bleeding 06/20/2016  . 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 Vaginal atrophy 01/12/2016  . 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    Jerl Mina ,PT, DPT, E-RYT  11/12/2016, 11:14 PM  [place] MAIN [facility] SERVICES [address] [place] [address] [place], [place], [address] Phone: [phone]   Fax:  [phone]  Name: Raiden Yearwood MRN: [id number] Date of Birth: [birth year]

## 2016-11-13 ENCOUNTER — Telehealth: Payer: Self-pay | Admitting: *Deleted

## 2016-11-13 NOTE — Telephone Encounter (Signed)
Patient advised to keep appointment as scheduled for tomorrow, patient agrees to keep appointment as planned

## 2016-11-13 NOTE — Telephone Encounter (Signed)
Keep sch. Apt per md

## 2016-11-13 NOTE — Telephone Encounter (Signed)
t asking if results are back for her appointment tomorrow, I do not see that the Chromosome Analysis is back yet, but the path report is back. Please advise if she should keep appointment for tomorrow morning

## 2016-11-14 ENCOUNTER — Ambulatory Visit (INDEPENDENT_AMBULATORY_CARE_PROVIDER_SITE_OTHER): Payer: Medicare Other | Admitting: Podiatry

## 2016-11-14 ENCOUNTER — Inpatient Hospital Stay: Payer: Medicare Other | Attending: Internal Medicine | Admitting: Internal Medicine

## 2016-11-14 ENCOUNTER — Encounter: Payer: Self-pay | Admitting: Podiatry

## 2016-11-14 ENCOUNTER — Inpatient Hospital Stay: Payer: Medicare Other

## 2016-11-14 DIAGNOSIS — Z170 Estrogen receptor positive status [ER+]: Secondary | ICD-10-CM | POA: Diagnosis not present

## 2016-11-14 DIAGNOSIS — M79676 Pain in unspecified toe(s): Secondary | ICD-10-CM

## 2016-11-14 DIAGNOSIS — Z79811 Long term (current) use of aromatase inhibitors: Secondary | ICD-10-CM

## 2016-11-14 DIAGNOSIS — R300 Dysuria: Secondary | ICD-10-CM | POA: Diagnosis not present

## 2016-11-14 DIAGNOSIS — E1142 Type 2 diabetes mellitus with diabetic polyneuropathy: Secondary | ICD-10-CM | POA: Diagnosis not present

## 2016-11-14 DIAGNOSIS — D61818 Other pancytopenia: Secondary | ICD-10-CM | POA: Insufficient documentation

## 2016-11-14 DIAGNOSIS — J45909 Unspecified asthma, uncomplicated: Secondary | ICD-10-CM | POA: Insufficient documentation

## 2016-11-14 DIAGNOSIS — E119 Type 2 diabetes mellitus without complications: Secondary | ICD-10-CM | POA: Insufficient documentation

## 2016-11-14 DIAGNOSIS — K588 Other irritable bowel syndrome: Secondary | ICD-10-CM | POA: Diagnosis not present

## 2016-11-14 DIAGNOSIS — E039 Hypothyroidism, unspecified: Secondary | ICD-10-CM | POA: Insufficient documentation

## 2016-11-14 DIAGNOSIS — Z923 Personal history of irradiation: Secondary | ICD-10-CM | POA: Diagnosis not present

## 2016-11-14 DIAGNOSIS — E785 Hyperlipidemia, unspecified: Secondary | ICD-10-CM | POA: Insufficient documentation

## 2016-11-14 DIAGNOSIS — D61811 Other drug-induced pancytopenia: Secondary | ICD-10-CM

## 2016-11-14 DIAGNOSIS — R109 Unspecified abdominal pain: Secondary | ICD-10-CM | POA: Diagnosis not present

## 2016-11-14 DIAGNOSIS — I509 Heart failure, unspecified: Secondary | ICD-10-CM | POA: Diagnosis not present

## 2016-11-14 DIAGNOSIS — K219 Gastro-esophageal reflux disease without esophagitis: Secondary | ICD-10-CM | POA: Diagnosis not present

## 2016-11-14 DIAGNOSIS — D0512 Intraductal carcinoma in situ of left breast: Secondary | ICD-10-CM | POA: Diagnosis present

## 2016-11-14 DIAGNOSIS — Z9012 Acquired absence of left breast and nipple: Secondary | ICD-10-CM

## 2016-11-14 DIAGNOSIS — I10 Essential (primary) hypertension: Secondary | ICD-10-CM | POA: Diagnosis not present

## 2016-11-14 DIAGNOSIS — B351 Tinea unguium: Secondary | ICD-10-CM | POA: Diagnosis not present

## 2016-11-14 DIAGNOSIS — Z79899 Other long term (current) drug therapy: Secondary | ICD-10-CM

## 2016-11-14 LAB — URINALYSIS, COMPLETE (UACMP) WITH MICROSCOPIC
Bilirubin Urine: NEGATIVE
Glucose, UA: NEGATIVE mg/dL
Hgb urine dipstick: NEGATIVE
Ketones, ur: NEGATIVE mg/dL
Leukocytes, UA: NEGATIVE
Nitrite: NEGATIVE
Protein, ur: 30 mg/dL — AB
RBC / HPF: NONE SEEN RBC/hpf (ref 0–5)
Specific Gravity, Urine: 1.025 (ref 1.005–1.030)
pH: 5 (ref 5.0–8.0)

## 2016-11-14 NOTE — Progress Notes (Signed)
Pt reports a new onset of low back pain that radiates laterally. "It's a stabbing pain." pt took 2 tylenol this am. worse at bedtime. rates pain 8/10. Pt reports burning with urination.  Patient started insulin - tresiba 10 units at bedtime due to hyperglycemia.

## 2016-11-14 NOTE — Progress Notes (Signed)
[name] NOTE  Patient Care Team: Albina Billet, MD as PCP - General (Internal Medicine)  CHIEF COMPLAINTS/PURPOSE OF CONSULTATION:   Oncology History   # AUG 2017- DCIS LEFT BREAST ER/PR- POS; positive margins [Dr.Smith]; s/p re-excision; s/p RT [finish RT Nov 10th 2017]; START ARIMIDEX- Jan 2018; Stopped in July 2018- sec to muscle cramps; Sep 2018- start Letrozole.  # Mild pancytopenia [CT-jan 2018-no hepatomegaly/spleneomegaly]. OCT 2018- BMBx- MILD dyspoiesis; variable cellularity [10-50%]; FISH/Cytogenetics-pending];  # HRT [clinical trial thru [facility]; stopped July 2017 ]; CPAP      Ductal carcinoma in situ (DCIS) of left breast     Oncology History   # AUG 2017- DCIS LEFT BREAST ER/PR- POS; positive margins [Dr.Smith]; s/p re-excision; s/p RT [finish RT Nov 10th 2017]; START ARIMIDEX- Jan 2018; Stopped in July 2018- sec to muscle cramps; Sep 2018- start Letrozole.  # Mild pancytopenia [CT-jan 2018-no hepatomegaly/spleneomegaly]. OCT 2018- BMBx- MILD dyspoiesis; variable cellularity [10-50%]; FISH/Cytogenetics-pending];  # HRT [clinical trial thru [facility]; stopped July 2017 ]; CPAP      Ductal carcinoma in situ (DCIS) of left breast     HISTORY OF PRESENTING ILLNESS:  Annette Foster 76 y.o.  female pleasant patient with left breast DCIS; And also history of pancytopenia is here to review the results of her bone marrow biopsy.  Patient's bone marrow biopsy procedure was uneventful. She tolerated the procedure fairly well.  Patient has been on letrozole; tolerating well otherwise no denies any worsening arthralgias or hot flashes. Patient denies any fevers or chills. Denies any nausea vomiting diarrhea. Denies any Coughing or shortness of breath. Admits to mild easy bruising. No nosebleeds.  Patient complains of mild dysuria back pain over the last few months. She has been treated with antibiotics with improvement in the past.  ROS: A complete 10 point  review of system is done which is negative except mentioned above in history of present illness  MEDICAL HISTORY:  Past Medical History:  Diagnosis Date  . Abdominal pain   . Allergy   . Cancer [facility]) 2017   breast cancer- Left  . Cataract   . CHF (congestive heart failure) ([place])   . Collagenous colitis   . Diabetes mellitus without complication ([place])   . Diarrhea   . Diverticulosis   . Fibrocystic breast   . GERD (gastroesophageal reflux disease)   . Heart murmur   . Hyperlipidemia   . Hypertension   . Hypothyroidism   . IBS (irritable bowel syndrome)   . IBS (irritable bowel syndrome)   . IDA (iron deficiency anemia)   . Personal history of radiation therapy   . PONV (postoperative nausea and vomiting)   . Sleep apnea    C-Pap    SURGICAL HISTORY: Past Surgical History:  Procedure Laterality Date  . ABDOMINAL HYSTERECTOMY     tah bso  . ABDOMINAL SURGERY    . APPENDECTOMY    . BREAST BIOPSY Bilateral    negative  . 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 BREAST BIOPSY Left 05/27/2016   neg  . CARDIAC SURGERY    . CATARACT EXTRACTION Right   . CHOLECYSTECTOMY    . COLONOSCOPY WITH PROPOFOL N/A 03/11/2016   Procedure: COLONOSCOPY WITH PROPOFOL;  Surgeon: Manya Silvas, MD;  Location: [facility] ENDOSCOPY;  Service: Endoscopy;  Laterality: N/A;  . ESOPHAGOGASTRODUODENOSCOPY (EGD) WITH PROPOFOL N/A 03/11/2016   Procedure: ESOPHAGOGASTRODUODENOSCOPY (EGD) WITH PROPOFOL;  Surgeon: Manya Silvas, MD;  Location: [facility] ENDOSCOPY;  Service: Endoscopy;  Laterality: N/A;  . JOINT REPLACEMENT Left    TKR  . left sinusplasty     . MASTECTOMY, PARTIAL Left 10/17/2015   Procedure: MASTECTOMY PARTIAL REVISION;  Surgeon: Nadeen Landau, MD;  Location: [facility] ORS;  Service: General;  Laterality: Left;  . PARTIAL MASTECTOMY WITH NEEDLE LOCALIZATION Left 09/29/2015   Procedure: PARTIAL MASTECTOMY WITH NEEDLE LOCALIZATION;  Surgeon: Nadeen Landau,  MD;  Location: [facility] ORS;  Service: General;  Laterality: Left;  . 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  Social History   Social History  . Marital status: Married    Spouse name: N/A  . Number of children: N/A  . Years of education: N/A   Occupational History  . Not on file.   Social History Main Topics  . Smoking status: Never Smoker  . Smokeless tobacco: Never Used  . Alcohol use No  . Drug use: No  . Sexual activity: Not Currently    Birth control/ protection: Surgical   Other Topics Concern  . Not on file   Social History Narrative  . No narrative on file    FAMILY HISTORY: Family History  Problem Relation Age of Onset  . Breast cancer Paternal Grandmother   . Colon cancer Father   . Diabetes Sister   . Diabetes Brother   . Heart disease Brother   . Prostate cancer Brother   . Colon cancer Maternal Uncle   . Prostate cancer Brother   . Bladder Cancer Brother   . Ovarian cancer Neg Hx   . Kidney cancer Neg Hx     ALLERGIES:  is allergic to fentanyl; codeine; demeclocycline; demerol [meperidine]; hydrocodone-acetaminophen; other; oxycodone; pentazocine; and tetracyclines & related.  MEDICATIONS:  Current Outpatient Prescriptions  Medication Sig Dispense Refill  . aspirin 81 MG tablet Take 81 mg by mouth at bedtime.    . budesonide-formoterol (SYMBICORT) 80-4.5 MCG/ACT inhaler Inhale 2 puffs into the lungs 2 (two) times daily as needed (shortness of breath).     . cholecalciferol (VITAMIN D) 1000 units tablet Take 2,000 Units by mouth daily.     . Cyanocobalamin (B-12 COMPLIANCE INJECTION) 1000 MCG/ML KIT Inject 1 Syringe as directed every 30 (thirty) days.    Marland Kitchen escitalopram (LEXAPRO) 5 MG tablet Take 5 mg by mouth at bedtime.    Marland Kitchen esomeprazole (NEXIUM) 40 MG capsule Take 40 mg by mouth daily at 12 noon.     . fenofibrate 160 MG tablet Take 160 mg by mouth  at bedtime.    . fexofenadine (ALLEGRA) 180 MG tablet Take 180 mg by mouth at bedtime.     . fluticasone (FLONASE) 50 MCG/ACT nasal spray Place 1 spray into the nose daily as needed for allergies.     . furosemide (LASIX) 20 MG tablet Take 20-40 mg by mouth every other day. Alternate between 1 a day and 2 a day    . gabapentin (NEURONTIN) 300 MG capsule Take 300 mg by mouth daily.    Marland Kitchen glimepiride (AMARYL) 2 MG tablet Take 2 mg by mouth at bedtime.    . hydrOXYzine (ATARAX/VISTARIL) 25 MG tablet 1 or 2 tablets at night for itching and sleep 30 tablet 2  . Hyoscyamine Sulfate 0.375 MG TBCR Take 0.375 mg by mouth 2 (two) times daily.     . Insulin Degludec (TRESIBA FLEXTOUCH [place]) Inject 10 Units into the skin  at bedtime.    . lansoprazole (PREVACID) 30 MG capsule Take 30 mg by mouth daily as needed (reflux).     Marland Kitchen letrozole (FEMARA) 2.5 MG tablet Take 1 tablet (2.5 mg total) by mouth daily. Once a day. 30 tablet 3  . levothyroxine (SYNTHROID, LEVOTHROID) 75 MCG tablet Take 75 mcg by mouth daily before breakfast.     . magnesium oxide (MAG-OX) 400 MG tablet Take by mouth.    . metFORMIN (GLUCOPHAGE-XR) 750 MG 24 hr tablet Take 750 mg by mouth 2 (two) times daily.     . mirabegron ER (MYRBETRIQ) 50 MG TB24 tablet Take 1 tablet (50 mg total) by mouth daily. 30 tablet 3  . MISC NATURAL PRODUCTS PO Take by mouth.    . montelukast (SINGULAIR) 10 MG tablet Take 10 mg by mouth at bedtime.     Marland Kitchen nystatin-triamcinolone (MYCOLOG II) cream Apply 1 application topically 2 (two) times daily. 60 g 1  . olopatadine (PATANOL) 0.1 % ophthalmic solution 1 drop 2 (two) times daily.    . potassium chloride SA (K-DUR,KLOR-CON) 20 MEQ tablet Take by mouth.    . saccharomyces boulardii (FLORASTOR) 250 MG capsule Take 250 mg by mouth 2 (two) times daily.     . simvastatin (ZOCOR) 20 MG tablet Take 20 mg by mouth at bedtime.    . valsartan (DIOVAN) 160 MG tablet Take 160 mg by mouth every morning.      No current  facility-administered medications for this visit.       Marland Kitchen  PHYSICAL EXAMINATION: ECOG PERFORMANCE STATUS: 0 - Asymptomatic  Vitals:   11/14/16 1030  BP: 123/73  Pulse: (!) 57  Resp: 20  Temp: (!) 97.3 F (36.3 C)   Filed Weights   11/14/16 1041  Weight: 172 lb 6.4 oz (78.2 kg)    GENERAL: Well-nourished well-developed; Alert, no distress and comfortable.  Alone.  EYES: no pallor or icterus OROPHARYNX: no thrush or ulceration; good dentition  NECK: supple, no masses felt LYMPH:  no palpable lymphadenopathy in the cervical, axillary or inguinal regions LUNGS: clear to auscultation and  No wheeze or crackles HEART/CVS: regular rate & rhythm and no murmurs; No lower extremity edema ABDOMEN: abdomen soft, tender in LLQ with normal bowel sounds Musculoskeletal:no cyanosis of digits and no clubbing  PSYCH: alert & oriented x 3 with fluent speech NEURO: no focal motor/sensory deficits SKIN:  no rashes or significant lesions  LABORATORY DATA:  I have reviewed the data as listed Lab Results  Component Value Date   WBC 2.9 (L) 11/04/2016   HGB 11.8 (L) 11/04/2016   HCT 35.1 11/04/2016   MCV 88.1 11/04/2016   PLT 92 (L) 11/04/2016    Recent Labs  02/15/16 0956 05/15/16 1340 10/25/16 0941  NA 138 138 138  K 3.8 3.9 4.4  CL 102 104 102  CO2 _0 GLUCOSE 137* 242* 209*  BUN _1 CREATININE 0.78 0.61 0.57  CALCIUM 8.7* 9.4 9.2  GFRNONAA >60 >60 >60  GFRAA >60 >60 >60  PROT 6.9 7.2 7.3  ALBUMIN 3.7 3.9 3.7  AST 71* 45* 70*  ALT 45 26 37  ALKPHOS 54 57 75  BILITOT 0.5 0.5 0.5    RADIOGRAPHIC STUDIES: I have personally reviewed the radiological images as listed and agreed with the findings in the report. Ct Biopsy  Result Date: 11/04/2016 CLINICAL DATA:  Pancytopenia.  History of breast carcinoma. EXAM: CT GUIDED DEEP ILIAC BONE ASPIRATION AND CORE  BIOPSY TECHNIQUE: The procedure, risks (including but not limited to bleeding, infection, organ damage ),  benefits, and alternatives were explained to the patient. Questions regarding the procedure were encouraged and answered. The patient understands and consents to the procedure. Patient was placed supine on the CT gantry and limited axial scans through the pelvis were obtained. Appropriate skin entry site was identified. Skin site was marked, prepped with chlorhexidine, draped in usual sterile fashion, and infiltrated locally with 1% lidocaine. Intravenous Fentanyl and Versed were administered as conscious sedation during continuous monitoring of the patient's level of consciousness and physiological / cardiorespiratory status by the radiology RN, with a total moderate sedation time of 30 minutes. Under CT fluoroscopic guidance an 11-gauge Cook trocar bone needle was advanced into the right iliac bone just lateral to the sacroiliac joint. Once needle tip position was confirmed, core and aspiration samples were obtained, submitted to pathology for approval. Post procedure scans show no hematoma or fracture. Patient tolerated procedure well. COMPLICATIONS: COMPLICATIONS none IMPRESSION: 1. Technically successful CT guided right iliac bone core and aspiration biopsy. Electronically Signed   By: Lucrezia Europe M.D.   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 [White count-2.7 ANC- 1.3; hemoglobin 11 platelets 101]- slightly worse from previous. [id number] BMBx- mild dyspoiesis noted.  10-50% variable hypercellular; cyto-FISH pending. Check zinc; copper. Discussed the concerns of possibility of MDS but is currently not being manifested on the current bone marrow biopsy. Also discussed the possibility that she might need a separate bone marrow biopsy down the line. For now continue surveillance.  # DCIS/papillary carcinoma in situ-  left breast status post RE- excision on adjuvant RT [finish 10th Nov 2017]. On letrozole. Tolerating  well.  # Bil flank pain; dysuria- months [Dr.Herrick; Urology]; check UA.   # DM- poorly controlled. A1c- 9.4 on insulin; as per PCP.   # check cbc every 2 months/ follow up in 4 months. Will call with results of FISH- [phone]/cell.   Dr.Tate     Cammie Sickle, MD 11/19/2016 7:45 AM

## 2016-11-14 NOTE — Progress Notes (Signed)
Complaint:  Visit Type: Patient returns to my office for continued preventative foot care services. Complaint: Patient states" my nails have grown long and thick and become painful to walk and wear shoes" Patient has been diagnosed with DM with no foot complications. The patient presents for preventative foot care services. No changes to ROS  Podiatric Exam: Vascular: dorsalis pedis and posterior tibial pulses are palpable bilateral. Capillary return is immediate. Temperature gradient is WNL. Skin turgor WNL  Sensorium: Normal Semmes Weinstein monofilament test. Normal tactile sensation bilaterally. Nail Exam: Pt has thick disfigured discolored nails with subungual debris noted bilateral entire nail hallux through fifth toenails.  Right hallux nail plate is absent.  Surgery has healed. Ulcer Exam: There is no evidence of ulcer or pre-ulcerative changes or infection. Orthopedic Exam: Muscle tone and strength are WNL. No limitations in general ROM. No crepitus or effusions noted. Foot type and digits show no abnormalities. Bony prominences are unremarkable. Skin: No Porokeratosis. No infection or ulcers  Diagnosis:  Onychomycosis, , Pain in right toe, pain in left toes  Treatment & Plan Procedures and Treatment: Consent by patient was obtained for treatment procedures. The patient understood the discussion of treatment and procedures well. All questions were answered thoroughly reviewed. Debridement of mycotic and hypertrophic toenails, 1 through 5 bilateral and clearing of subungual debris. No ulceration, no infection noted.   Return Visit-Office Procedure: Patient instructed to return to the office for a follow up visit 3 months for continued evaluation and treatment.    Gardiner Barefoot DPM

## 2016-11-14 NOTE — Assessment & Plan Note (Addendum)
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 [White count-2.7 ANC- 1.3; hemoglobin 11 platelets 101]- slightly worse from previous. [id number] BMBx- mild dyspoiesis noted.  10-50% variable hypercellular; cyto-FISH pending. Check zinc; copper. Discussed the concerns of possibility of MDS but is currently not being manifested on the current bone marrow biopsy. Also discussed the possibility that she might need a separate bone marrow biopsy down the line. For now continue surveillance.  # DCIS/papillary carcinoma in situ-  left breast status post RE- excision on adjuvant RT [finish 10th Nov 2017]. On letrozole. Tolerating well.  # Bil flank pain; dysuria- months [Dr.Herrick; Urology]; check UA.   # DM- poorly controlled. A1c- 9.4 on insulin; as per PCP.   # check cbc every 2 months/ follow up in 4 months. Will call with results of FISH- [phone]/cell.   Dr.Tate

## 2016-11-15 LAB — URINE CULTURE

## 2016-11-18 ENCOUNTER — Ambulatory Visit: Payer: Medicare Other | Admitting: Physical Therapy

## 2016-11-18 DIAGNOSIS — R279 Unspecified lack of coordination: Secondary | ICD-10-CM

## 2016-11-18 DIAGNOSIS — M25512 Pain in left shoulder: Secondary | ICD-10-CM

## 2016-11-18 DIAGNOSIS — R2689 Other abnormalities of gait and mobility: Secondary | ICD-10-CM

## 2016-11-18 DIAGNOSIS — M791 Myalgia, unspecified site: Secondary | ICD-10-CM

## 2016-11-18 DIAGNOSIS — R278 Other lack of coordination: Secondary | ICD-10-CM

## 2016-11-18 DIAGNOSIS — G8929 Other chronic pain: Secondary | ICD-10-CM

## 2016-11-18 NOTE — Therapy (Signed)
[place] MAIN [facility] SERVICES [address] [place], [place], [address] Phone: [phone]   Fax:  [phone]  Physical Therapy Treatment  Patient Details  Name: Annette Foster MRN: [id number] Date of Birth: [birth year] No Data Recorded  Encounter Date: 11/18/2016      PT End of Session - 11/18/16 1559    Visit Number 2   Number of Visits 12   Date for PT Re-Evaluation 02/03/17   Authorization Type g code   PT Start Time 1500   PT Stop Time 1535   PT Time Calculation (min) 35 min   Activity Tolerance No increased pain;Patient tolerated treatment well   Behavior During Therapy [facility] for tasks assessed/performed      Past Medical History:  Diagnosis Date  . Abdominal pain   . Allergy   . Cancer [facility]) 2017   breast cancer- Left  . Cataract   . CHF (congestive heart failure) ([place])   . Collagenous colitis   . Diabetes mellitus without complication ([place])   . Diarrhea   . Diverticulosis   . Fibrocystic breast   . GERD (gastroesophageal reflux disease)   . Heart murmur   . Hyperlipidemia   . Hypertension   . Hypothyroidism   . IBS (irritable bowel syndrome)   . IBS (irritable bowel syndrome)   . IDA (iron deficiency anemia)   . Personal history of radiation therapy   . PONV (postoperative nausea and vomiting)   . Sleep apnea    C-Pap    Past Surgical History:  Procedure Laterality Date  . ABDOMINAL HYSTERECTOMY     tah bso  . ABDOMINAL SURGERY    . APPENDECTOMY    . BREAST BIOPSY Bilateral    negative  . 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 BREAST BIOPSY Left 05/27/2016   neg  . CARDIAC SURGERY    . CATARACT EXTRACTION Right   . CHOLECYSTECTOMY    . COLONOSCOPY WITH PROPOFOL N/A 03/11/2016   Procedure: COLONOSCOPY WITH PROPOFOL;  Surgeon: Manya Silvas, MD;  Location[facility] ENDOSCOPY;  Service: Endoscopy;  Laterality: N/A;  . ESOPHAGOGASTRODUODENOSCOPY (EGD) WITH PROPOFOL N/A 03/11/2016    Procedure: ESOPHAGOGASTRODUODENOSCOPY (EGD) WITH PROPOFOL;  Surgeon: Manya Silvas, MD;  Location: [facility] ENDOSCOPY;  Service: Endoscopy;  Laterality: N/A;  . JOINT REPLACEMENT Left    TKR  . left sinusplasty     . MASTECTOMY, PARTIAL Left 10/17/2015   Procedure: MASTECTOMY PARTIAL REVISION;  Surgeon: Leonie Green, MD;  Location: [facility] ORS;  Service: General;  Laterality: Left;  . PARTIAL MASTECTOMY WITH NEEDLE LOCALIZATION Left 09/29/2015   Procedure: PARTIAL MASTECTOMY WITH NEEDLE LOCALIZATION;  Surgeon: Leonie Green, MD;  Location: [facility] ORS;  Service: General;  Laterality: Left;  . TOTAL ABDOMINAL HYSTERECTOMY W/ BILATERAL SALPINGOOPHORECTOMY      There were no vitals filed for this visit.      Subjective Assessment - 11/18/16 1510    Subjective Pt reported she has felt so much better learning how to empty her bladder with the breathing technique. Pt has performed it three times before it totally emptied. Pt reported her LBP is better. Pt reported she saw her PCP who has spoken with Dr. Louis Meckel , her urologist and Dr. Anette Riedel her gynecologist . Her team has agreed to get her off of her cortisone because they suspect that it is aggravating  her high sugar levels and yeast infections in her vulva. Pt now is taking  insulin to control her blood sugars levels.  Pt will see her urologist tomorrow.      Pertinent History Pt is waiting on a bone marrow biopsy because her oncologist was concerned about the recent epsiodes of night sweats and blood count drop. Pt will be calling back her clinic this week. Pt is also following up with her PCP Dr. Arlana Pouch about her blood sugar levels which has been high even though she has cut back on her sugars.   Pt has had heart surgery in 1972. Pt exercises with the cardiorehab program.              [facility] PT Assessment - 11/18/16 1538      Bed Mobility   Bed Mobility --  half crunch                      [facility] Adult PT  Treatment/Exercise - 11/18/16 1538      Therapeutic Activites    Therapeutic Activities --   log rolling, discussed POC       Neuro Re-ed    Neuro Re-ed Details  see pt instructions                 PT Education - 11/18/16 1559    Education provided Yes   Education Details HEP   Person(s) Educated Patient   Methods Explanation;Demonstration;Tactile cues;Verbal cues;Handout   Comprehension Returned demonstration;Verbalized understanding             PT Long Term Goals - 11/12/16 2304      PT LONG TERM GOAL #1   Title Pt will increase her score on PSFS for "turning over bed" from 0 to > 5pts, "walking an hour from 1 pt to > 3 pts, and excerising an hour form 1 pt to > 3 pts in order to return to activities   Time 12   Period Weeks   Status New     PT LONG TERM GOAL #2   Title Pt will demo proper coordination of deep core mm to improve pelvic floor function   Time 4   Period Weeks   Status New     PT LONG TERM GOAL #3   Title Pt will demo decreased paraspinal mm tensions and increased spinal mobility in order to promote deep core coordination and function    Time 2   Period Weeks   Status New               Plan - 11/18/16 1559    Clinical Impression Statement At her 2nd visit today, pt has made excellent progress as she reported she no longer has difficulty with emptying her urine completely with the breathing technique and pelvic floor coordination technique. Pt was advanced to deep core strengthening today. Pt also showed improved spinal ROM and reports no LBP.  Pt reported that her PCP, urologist, and gynecologist are working together to decrease her cortisone because they suspect it has been affecting her high sugar levels and chronic yeast infections. Internal pelvic assessments will be withheld until she has been tested negative for infections. Pt will continue to stay up to date with her interdisciplinary team  to ensure patient-center care. Once pt is  clear of any infections, PT plans to address any scar adhesions and promote tissue mobility for optimal pelvic floor function long-term. PT will defer to external treatments in the meantime. Pt continues to benefit from skilled PT.      Rehab Potential  Good   PT Frequency 1x / week   PT Duration 12 weeks   PT Treatment/Interventions Therapeutic activities;Therapeutic exercise;Moist Heat;Traction;Stair training;Gait training;Neuromuscular re-education;Balance training;Patient/family education;Manual techniques;Manual lymph drainage;Energy conservation;Passive range of motion;Functional mobility training   Consulted and Agree with Plan of Care Patient      Patient will benefit from skilled therapeutic intervention in order to improve the following deficits and impairments:  Abnormal gait, Increased muscle spasms, Postural dysfunction, Improper body mechanics, Decreased scar mobility, Decreased range of motion, Decreased safety awareness, Decreased strength, Decreased endurance, Decreased balance, Decreased mobility, Difficulty walking, Decreased coordination, Hypomobility  Visit Diagnosis: Unspecified lack of coordination  Other lack of coordination  Myalgia  Other abnormalities of gait and mobility  Chronic left shoulder pain     Problem List Patient Active Problem List   Diagnosis Date Noted  . Other pancytopenia ([place]) 11/14/2016  . 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 Status post hysterectomy 06/20/2016  . H/O vaginal bleeding 06/20/2016  . 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 Vaginal atrophy 01/12/2016  . 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    Jerl Mina ,PT, DPT, E-RYT  11/18/2016, 4:05 PM  [place] MAIN [facility] SERVICES [address] [place], [place], [address] Phone: [phone]   Fax:  [phone]  Name: Annette Foster MRN:  [id number] Date of Birth: [birth year]

## 2016-11-18 NOTE — Patient Instructions (Signed)
Log rolling out of bed to minimize strain on back and pelvic floor and abdomen    Deep core level 1 ( 10 breaths)  Deep core level 2 ( 6 min)

## 2016-11-19 ENCOUNTER — Encounter: Payer: Self-pay | Admitting: Urology

## 2016-11-19 ENCOUNTER — Ambulatory Visit (INDEPENDENT_AMBULATORY_CARE_PROVIDER_SITE_OTHER): Payer: Medicare Other | Admitting: Urology

## 2016-11-19 VITALS — BP 151/68 | HR 71 | Ht 63.0 in | Wt 178.0 lb

## 2016-11-19 DIAGNOSIS — R300 Dysuria: Secondary | ICD-10-CM | POA: Diagnosis not present

## 2016-11-19 LAB — URINALYSIS, COMPLETE
Bilirubin, UA: NEGATIVE
Glucose, UA: NEGATIVE
Ketones, UA: NEGATIVE
Leukocytes, UA: NEGATIVE
Nitrite, UA: NEGATIVE
Protein, UA: NEGATIVE
RBC, UA: NEGATIVE
Specific Gravity, UA: 1.015 (ref 1.005–1.030)
Urobilinogen, Ur: 0.2 mg/dL (ref 0.2–1.0)
pH, UA: 7 (ref 5.0–7.5)

## 2016-11-19 NOTE — Progress Notes (Signed)
11/19/2016 3:24 PM   Annette Foster 09/09/1940 [id number]  Referring provider: Albina Billet, MD [address]   [place], [place] [address]  Chief Complaint  Patient presents with  . Dysuria    follow up    HPI: 76 year old female who is seen today for further evaluation and management of dysuria and pelvic pain. The patient has had these symptoms for at least the last 6 months, around the time of her breast cancer treatment. She describes constant bladder pressure, dysuria, urinary frequency, and urgency. Her last several cultures of been negative, despite having symptoms. She denies any hematuria. Prior to these recent symptoms she has no real history of recurrent urinary tract infections. She denies a history of dyspareunia or constipation. She feels that she empties her bladder effectively.     Interval: The patient presents today for follow-up, 3 months after her initial evaluation. She is now being seen and treated for pelvic floor dysfunction and has had some outstanding success. After her initial 2 treatments. She is feeling so much better now that she is able to empty her bladder more completely and she has some low back pain that is now resolved. She is having less bladder pressure, frequency, and urgency. She denies any ongoing dysuria.  PMH: Past Medical History:  Diagnosis Date  . Abdominal pain   . Allergy   . Cancer [facility]) 2017   breast cancer- Left  . Cataract   . CHF (congestive heart failure) ([place])   . Collagenous colitis   . Diabetes mellitus without complication ([place])   . Diarrhea   . Diverticulosis   . Fibrocystic breast   . GERD (gastroesophageal reflux disease)   . Heart murmur   . Hyperlipidemia   . Hypertension   . Hypothyroidism   . IBS (irritable bowel syndrome)   . IBS (irritable bowel syndrome)   . IDA (iron deficiency anemia)   . Personal history of radiation therapy   . PONV (postoperative nausea and vomiting)   . Sleep apnea    C-Pap     Surgical History: Past Surgical History:  Procedure Laterality Date  . ABDOMINAL HYSTERECTOMY     tah bso  . ABDOMINAL SURGERY    . APPENDECTOMY    . BREAST BIOPSY Bilateral    negative  . 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 BREAST BIOPSY Left 05/27/2016   neg  . CARDIAC SURGERY    . CATARACT EXTRACTION Right   . CHOLECYSTECTOMY    . COLONOSCOPY WITH PROPOFOL N/A 03/11/2016   Procedure: COLONOSCOPY WITH PROPOFOL;  Surgeon: Manya Silvas, MD;  Location[facility] ENDOSCOPY;  Service: Endoscopy;  Laterality: N/A;  . ESOPHAGOGASTRODUODENOSCOPY (EGD) WITH PROPOFOL N/A 03/11/2016   Procedure: ESOPHAGOGASTRODUODENOSCOPY (EGD) WITH PROPOFOL;  Surgeon: Manya Silvas, MD;  Location: [facility] ENDOSCOPY;  Service: Endoscopy;  Laterality: N/A;  . JOINT REPLACEMENT Left    TKR  . left sinusplasty     . MASTECTOMY, PARTIAL Left 10/17/2015   Procedure: MASTECTOMY PARTIAL REVISION;  Surgeon: Leonie Green, MD;  Location: [facility] ORS;  Service: General;  Laterality: Left;  . PARTIAL MASTECTOMY WITH NEEDLE LOCALIZATION Left 09/29/2015   Procedure: PARTIAL MASTECTOMY WITH NEEDLE LOCALIZATION;  Surgeon: Leonie Green, MD;  Location: [facility] ORS;  Service: General;  Laterality: Left;  . TOTAL ABDOMINAL HYSTERECTOMY W/ BILATERAL SALPINGOOPHORECTOMY      Home Medications:  Allergies as of 11/19/2016      Reactions  Fentanyl Shortness Of Breath, Itching, Nausea And Vomiting   Codeine Itching, Nausea And Vomiting   Demeclocycline Other (See Comments), Rash   Demerol [meperidine] Itching, Nausea And Vomiting   Hydrocodone-acetaminophen Itching, Nausea And Vomiting   Other Other (See Comments)   Oxycodone Itching, Nausea And Vomiting   Pentazocine Other (See Comments)   Tetracyclines & Related Rash      Medication List       Accurate as of 11/19/16  3:24 PM. Always use your most recent med list.          aspirin 81 MG tablet Take 81 mg by mouth at  bedtime.   B-12 COMPLIANCE INJECTION 1000 MCG/ML Kit Generic drug:  Cyanocobalamin Inject 1 Syringe as directed every 30 (thirty) days.   budesonide-formoterol 80-4.5 MCG/ACT inhaler Commonly known as:  SYMBICORT Inhale 2 puffs into the lungs 2 (two) times daily as needed (shortness of breath).   cholecalciferol 1000 units tablet Commonly known as:  VITAMIN D Take 2,000 Units by mouth daily.   escitalopram 5 MG tablet Commonly known as:  LEXAPRO Take 5 mg by mouth at bedtime.   esomeprazole 40 MG capsule Commonly known as:  NEXIUM Take 40 mg by mouth daily at 12 noon.   fenofibrate 160 MG tablet Take 160 mg by mouth at bedtime.   fexofenadine 180 MG tablet Commonly known as:  ALLEGRA Take 180 mg by mouth at bedtime.   fluticasone 50 MCG/ACT nasal spray Commonly known as:  FLONASE Place 1 spray into the nose daily as needed for allergies.   furosemide 20 MG tablet Commonly known as:  LASIX Take 20-40 mg by mouth every other day. Alternate between 1 a day and 2 a day   gabapentin 300 MG capsule Commonly known as:  NEURONTIN Take 300 mg by mouth daily.   glimepiride 2 MG tablet Commonly known as:  AMARYL Take 2 mg by mouth at bedtime.   hydrOXYzine 25 MG tablet Commonly known as:  ATARAX/VISTARIL 1 or 2 tablets at night for itching and sleep   Hyoscyamine Sulfate 0.375 MG Tbcr Take 0.375 mg by mouth 2 (two) times daily.   lansoprazole 30 MG capsule Commonly known as:  PREVACID Take 30 mg by mouth daily as needed (reflux).   letrozole 2.5 MG tablet Commonly known as:  FEMARA Take 1 tablet (2.5 mg total) by mouth daily. Once a day.   levothyroxine 75 MCG tablet Commonly known as:  SYNTHROID, LEVOTHROID Take 75 mcg by mouth daily before breakfast.   magnesium oxide 400 MG tablet Commonly known as:  MAG-OX Take by mouth.   metFORMIN 750 MG 24 hr tablet Commonly known as:  GLUCOPHAGE-XR Take 750 mg by mouth 2 (two) times daily.   mirabegron ER 50 MG  Tb24 tablet Commonly known as:  MYRBETRIQ Take 1 tablet (50 mg total) by mouth daily.   MISC NATURAL PRODUCTS PO Take by mouth.   montelukast 10 MG tablet Commonly known as:  SINGULAIR Take 10 mg by mouth at bedtime.   nystatin-triamcinolone cream Commonly known as:  MYCOLOG II Apply 1 application topically 2 (two) times daily.   olopatadine 0.1 % ophthalmic solution Commonly known as:  PATANOL 1 drop 2 (two) times daily.   potassium chloride SA 20 MEQ tablet Commonly known as:  K-DUR,KLOR-CON Take by mouth.   saccharomyces boulardii 250 MG capsule Commonly known as:  FLORASTOR Take 250 mg by mouth 2 (two) times daily.   simvastatin 20 MG tablet Commonly known as:  ZOCOR Take 20 mg by mouth at bedtime.   TRESIBA FLEXTOUCH Home Inject 10 Units into the skin at bedtime.   valsartan 160 MG tablet Commonly known as:  DIOVAN Take 160 mg by mouth every morning.       Allergies:  Allergies  Allergen Reactions  . Fentanyl Shortness Of Breath, Itching and Nausea And Vomiting  . Codeine Itching and Nausea And Vomiting  . Demeclocycline Other (See Comments) and Rash  . Demerol [Meperidine] Itching and Nausea And Vomiting  . Hydrocodone-Acetaminophen Itching and Nausea And Vomiting  . Other Other (See Comments)  . Oxycodone Itching and Nausea And Vomiting  . Pentazocine Other (See Comments)  . Tetracyclines & Related Rash    Family History: Family History  Problem Relation Age of Onset  . Breast cancer Paternal Grandmother   . Colon cancer Father   . Diabetes Sister   . Diabetes Brother   . Heart disease Brother   . Prostate cancer Brother   . Colon cancer Maternal Uncle   . Prostate cancer Brother   . Bladder Cancer Brother   . Ovarian cancer Neg Hx   . Kidney cancer Neg Hx     Social History:  reports that she has never smoked. She has never used smokeless tobacco. She reports that she does not drink alcohol or use drugs.  ROS: UROLOGY Frequent  Urination?: Yes Hard to postpone urination?: No Burning/pain with urination?: No Get up at night to urinate?: Yes Leakage of urine?: No Urine stream starts and stops?: No Trouble starting stream?: No Do you have to strain to urinate?: No Blood in urine?: No Urinary tract infection?: No Sexually transmitted disease?: No Injury to kidneys or bladder?: No Painful intercourse?: No Weak stream?: No Currently pregnant?: No Vaginal bleeding?: No Last menstrual period?: n  Gastrointestinal Nausea?: No Vomiting?: No Indigestion/heartburn?: No Diarrhea?: No Constipation?: No  Constitutional Fever: No Night sweats?: Yes Weight loss?: No Fatigue?: No  Skin Skin rash/lesions?: No Itching?: No  Eyes Blurred vision?: No Double vision?: No  Ears/Nose/Throat Sore throat?: No Sinus problems?: Yes  Hematologic/Lymphatic Swollen glands?: No Easy bruising?: Yes  Cardiovascular Leg swelling?: No Chest pain?: No  Respiratory Cough?: Yes Shortness of breath?: No  Endocrine Excessive thirst?: Yes  Musculoskeletal Back pain?: Yes Joint pain?: No  Neurological Headaches?: No Dizziness?: No  Psychologic Depression?: No Anxiety?: No  Physical Exam: BP (!) 151/68 (BP Location: Right Arm, Patient Position: Sitting, Cuff Size: Normal)   Pulse 71   Ht 5' 3" (1.6 m)   Wt 178 lb (80.7 kg)   LMP  (LMP Unknown)   BMI 31.53 kg/m   Constitutional:  Alert and oriented, No acute distress. HEENT: [place] AT, moist mucus membranes.  Trachea midline, no masses. Cardiovascular: No clubbing, cyanosis, or edema. Respiratory: Normal respiratory effort, no increased work of breathing. GI: Abdomen is soft, nontender, nondistended, no abdominal masses GU: No CVA tenderness.  The pelvic exam was deferred at this time, the patient states that she's recently had a pelvic exam with her gynecologist who noted normal anatomy without evidence of prolapse. Skin: No rashes, bruises or suspicious  lesions. Lymph: No cervical or inguinal adenopathy. Neurologic: Grossly intact, no focal deficits, moving all 4 extremities. Psychiatric: Normal mood and affect.  Laboratory Data: Lab Results  Component Value Date   WBC 2.9 (L) 11/04/2016   HGB 11.8 (L) 11/04/2016   HCT 35.1 11/04/2016   MCV 88.1 11/04/2016   PLT 92 (L) 11/04/2016    Lab  Results  Component Value Date   CREATININE 0.57 10/25/2016    No results found for: PSA  No results found for: TESTOSTERONE  No results found for: HGBA1C  Urinalysis: The patient's urinalysis today demonstrates no evidence of infection or Ronalee Belts scopic hematuria  Pertinent Imaging: None  Assessment & Plan:  At this point, the patient appears to be progressing nicely with her current out rhythm. I would recommend continued pelvic floor physical therapy. We also discussed continuing with myrbetriq 50 mg daily. Recommended that she consider cutting these in half after a month of additional pelvic floor physical therapy and possibly discontinuing them after a couple months of therapy.  The patient has quite a bit happening within her life, her husband is with dementia and requires full-time care, she is constantly with him and facilitating his care. As such, I have recommended that the patient return to see [place] only if she has problems. She will follow-up with [place] on an as-needed basis. 1. Dysuria   No Follow-up on file.  Ardis Hughs, [facility] [address] [place], [place] [address] [phone]

## 2016-11-25 ENCOUNTER — Ambulatory Visit: Payer: Medicare Other | Admitting: Podiatry

## 2016-11-27 ENCOUNTER — Encounter (HOSPITAL_COMMUNITY): Payer: Self-pay

## 2016-11-27 LAB — TISSUE HYBRIDIZATION (BONE MARROW)-NCBH

## 2016-11-27 LAB — CHROMOSOME ANALYSIS, BONE MARROW

## 2016-12-02 ENCOUNTER — Ambulatory Visit: Payer: Medicare Other | Admitting: Physical Therapy

## 2016-12-02 DIAGNOSIS — R278 Other lack of coordination: Secondary | ICD-10-CM

## 2016-12-02 DIAGNOSIS — R2689 Other abnormalities of gait and mobility: Secondary | ICD-10-CM

## 2016-12-02 DIAGNOSIS — G8929 Other chronic pain: Secondary | ICD-10-CM

## 2016-12-02 DIAGNOSIS — M25512 Pain in left shoulder: Secondary | ICD-10-CM

## 2016-12-02 DIAGNOSIS — M791 Myalgia, unspecified site: Secondary | ICD-10-CM

## 2016-12-02 DIAGNOSIS — R279 Unspecified lack of coordination: Secondary | ICD-10-CM

## 2016-12-02 NOTE — Patient Instructions (Signed)
Bicep curls ,  lift on exhale   10 x 2 each side    _____  Tricep curls  By the wall, one foot in front like on ski tracks  Front knee above ankle   shoulder down,  Exhale straighten elbow   10 x 2 reps  _____    Mini squats behind chair  Knees below toes   10 rep x 3

## 2016-12-03 NOTE — Therapy (Addendum)
[place] MAIN [facility] SERVICES [address] [place], [place], [address] Phone: [phone]   Fax:  [phone]  Physical Therapy Treatment  Patient Details  Name: Annette Foster MRN: [id number] Date of Birth: Feb 13, 1940 No Data Recorded  Encounter Date: 12/02/2016      PT End of Session - 12/02/16 1537    Visit Number 3   Number of Visits 12   Date for PT Re-Evaluation 02/03/17   Authorization Type g code   PT Start Time 1510   PT Stop Time 1540   PT Time Calculation (min) 30 min   Activity Tolerance No increased pain;Patient tolerated treatment well   Behavior During Therapy [facility] for tasks assessed/performed      Past Medical History:  Diagnosis Date  . Abdominal pain   . Allergy   . Cancer [facility]) 2017   breast cancer- Left  . Cataract   . CHF (congestive heart failure) ([place])   . Collagenous colitis   . Diabetes mellitus without complication ([place])   . Diarrhea   . Diverticulosis   . Fibrocystic breast   . GERD (gastroesophageal reflux disease)   . Heart murmur   . Hyperlipidemia   . Hypertension   . Hypothyroidism   . IBS (irritable bowel syndrome)   . IBS (irritable bowel syndrome)   . IDA (iron deficiency anemia)   . Personal history of radiation therapy   . PONV (postoperative nausea and vomiting)   . Sleep apnea    C-Pap    Past Surgical History:  Procedure Laterality Date  . ABDOMINAL HYSTERECTOMY     tah bso  . ABDOMINAL SURGERY    . APPENDECTOMY    . BREAST BIOPSY Bilateral    negative  . 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 BREAST BIOPSY Left 05/27/2016   neg  . CARDIAC SURGERY    . CATARACT EXTRACTION Right   . CHOLECYSTECTOMY    . COLONOSCOPY WITH PROPOFOL N/A 03/11/2016   Procedure: COLONOSCOPY WITH PROPOFOL;  Surgeon: Manya Silvas, MD;  Location: [facility] ENDOSCOPY;  Service: Endoscopy;  Laterality: N/A;  . ESOPHAGOGASTRODUODENOSCOPY (EGD) WITH PROPOFOL N/A 03/11/2016    Procedure: ESOPHAGOGASTRODUODENOSCOPY (EGD) WITH PROPOFOL;  Surgeon: Manya Silvas, MD;  Location: [facility] ENDOSCOPY;  Service: Endoscopy;  Laterality: N/A;  . JOINT REPLACEMENT Left    TKR  . left sinusplasty     . MASTECTOMY, PARTIAL Left 10/17/2015   Procedure: MASTECTOMY PARTIAL REVISION;  Surgeon: Leonie Green, MD;  Location: [facility] ORS;  Service: General;  Laterality: Left;  . PARTIAL MASTECTOMY WITH NEEDLE LOCALIZATION Left 09/29/2015   Procedure: PARTIAL MASTECTOMY WITH NEEDLE LOCALIZATION;  Surgeon: Leonie Green, MD;  Location: [facility] ORS;  Service: General;  Laterality: Left;  . TOTAL ABDOMINAL HYSTERECTOMY W/ BILATERAL SALPINGOOPHORECTOMY      There were no vitals filed for this visit.      Subjective Assessment - 12/02/16 1514    Subjective Pt reported she has been able to completely empty her bladder.     Pertinent History Pt is waiting on a bone marrow biopsy because her oncologist was concerned about the recent epsiodes of night sweats and blood count drop. Pt will be calling back her clinic this week. Pt is also following up with her PCP Dr. Hall Busing about her blood sugar levels which has been high even though she has cut back on her sugars.   Pt has had heart surgery in 1972. Pt  exercises with the cardiorehab program.              [facility] PT Assessment - 12/03/16 0857      Posture/Postural Control   Posture Comments increased deep core strength with more upright position, swift gait                      [facility] Adult PT Treatment/Exercise - 12/03/16 0857      Neuro Re-ed    Neuro Re-ed Details  see pt instructions                 PT Education - 12/02/16 1537    Education provided Yes   Education Details HEP   Person(s) Educated Patient   Methods Explanation;Demonstration;Tactile cues;Verbal cues;Handout   Comprehension Returned demonstration;Verbalized understanding             PT Long Term Goals - 12/02/16 1515      PT LONG  TERM GOAL #1   Title Pt will increase her score on PSFS for "turning over bed" from 0 to > 5pts, "walking an hour from 1 pt to > 3 pts, and excerising an hour form 1 pt to > 3 pts in order to return to activities   Baseline "turning over bed" from 0 to > 5pts ( 10/22: 6-7 pts),  "walking an hour from 1 pt to > 3 pts  ( 10/22: 8 pts)  , excerising an hour form 1 pt to > 3 pts ( n/a because she has not been called for silver sneakers class)     Time 12   Period Weeks   Status Partially Met     PT LONG TERM GOAL #2   Title Pt will demo proper coordination of deep core mm to improve pelvic floor function   Time 4   Period Weeks   Status Achieved     PT LONG TERM GOAL #3   Title Pt will demo decreased paraspinal mm tensions and increased spinal mobility in order to promote deep core coordination and function    Time 2   Period Weeks   Status Achieved     PT LONG TERM GOAL #4   Title Pt will demo proper pelvic floor coordination and lengthening with fitness exercises to minimize relapse of Sx   Time 4   Period Weeks   Status New               Plan - 12/02/16 1538    Clinical Impression Statement Pt continues to have no difficulty with completely emptying urine. Pt demo'd improved deep core strength and upright posture. Progressed pt to alingment and proper technique with weight lifting light dumbbells because pt would like to maintain a fitness routine at home. Pt is unable to attend Silver Sne[employer] classes due to visiting her husband at his nursing home. Pt demo'd improved technique with less upper trap overuse and required down grade of position to accommodate for her knee issues.  Pt continues to benefit form skilled PT with the remaining sessions focused on helping pt return to fitness. Session today was abbreviated to not overload pt with too many new home exercises in order to build compliance. Pt was provided a booklet of exercises for seniors to review for next session.     Rehab  Potential Good   PT Frequency 1x / week   PT Duration 12 weeks   PT Treatment/Interventions Therapeutic activities;Therapeutic exercise;Moist Heat;Traction;Stair training;Gait training;Neuromuscular re-education;Balance training;Patient/family education;Manual techniques;Manual  lymph drainage;Energy conservation;Passive range of motion;Functional mobility training   Consulted and Agree with Plan of Care Patient      Patient will benefit from skilled therapeutic intervention in order to improve the following deficits and impairments:  Abnormal gait, Increased muscle spasms, Postural dysfunction, Improper body mechanics, Decreased scar mobility, Decreased range of motion, Decreased safety awareness, Decreased strength, Decreased balance, Decreased mobility, Difficulty walking, Decreased coordination, Hypomobility, Decreased endurance  Visit Diagnosis: Other lack of coordination  Myalgia  Unspecified lack of coordination  Other abnormalities of gait and mobility  Chronic left shoulder pain     Problem List Patient Active Problem List   Diagnosis Date Noted  . Other pancytopenia ([place]) 11/14/2016  . 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 Status post hysterectomy 06/20/2016  . H/O vaginal bleeding 06/20/2016  . 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 Vaginal atrophy 01/12/2016  . 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    Jerl Mina ,PT, DPT, E-RYT  12/03/2016, 9:01 AM  [place] MAIN [facility] SERVICES [address] [place], [place], [address] Phone: [phone]   Fax:  [phone]  Name: Maddison Kilner MRN: [id number] Date of Birth: 11-11-1940

## 2016-12-04 ENCOUNTER — Encounter: Payer: Self-pay | Admitting: Obstetrics and Gynecology

## 2016-12-04 ENCOUNTER — Ambulatory Visit (INDEPENDENT_AMBULATORY_CARE_PROVIDER_SITE_OTHER): Payer: Medicare Other | Admitting: Obstetrics and Gynecology

## 2016-12-04 VITALS — BP 157/63 | HR 63 | Ht 63.0 in | Wt 176.2 lb

## 2016-12-04 DIAGNOSIS — N763 Subacute and chronic vulvitis: Secondary | ICD-10-CM | POA: Diagnosis not present

## 2016-12-04 DIAGNOSIS — B373 Candidiasis of vulva and vagina: Secondary | ICD-10-CM

## 2016-12-04 DIAGNOSIS — B3731 Acute candidiasis of vulva and vagina: Secondary | ICD-10-CM

## 2016-12-04 NOTE — Patient Instructions (Signed)
1.  Continue using nystatin/triamcinolone cream to the vulva once a day for the next 14 days 2.  Return in 4 weeks for follow-up 3.  Continue working on blood sugar control with Dr. Hall Busing

## 2016-12-04 NOTE — Progress Notes (Signed)
Chief complaint: 1.  Chronic vulvitis  Annette Foster presents today for follow-up on chronic phlebitis.  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 She has noted dramatic improvement in symptomatology. She stopped taking the medication 3 days ago.  Annette Foster states that her blood sugars have been suboptimally controlled.  Dr. Hall Busing has her on 10 units of insulin at night and is to increase that to 15 per night starting today.  She does understand the association between blood sugar control and chronic monilia vulvar infections.  Past medical history, past surgical history, problem list, medications, and allergies are reviewed  OBJECTIVE: BP (!) 157/63   Pulse 63   Ht 5' 3"  (1.6 m)   Wt 176 lb 3.2 oz (79.9 kg)   LMP  (LMP Unknown)   BMI 31.21 kg/m  Pleasant well-appearing female in no acute distress she is alert and oriented.  Affect is appropriate.  She is very pleased with resolution of vulvar itching and burning. Pelvic exam: External genitalia-marketed reduction in vulvar hyperemia with minimal residual effectperianal region notable for resolution of hyperemiawithout leukoplakia; lateral margins of hyperemia appear much less thickened when compared to last visit.Marland Kitchen Biopsy sites are healed  ASSESSMENT: 1.  Resolving monilia vulvitis 2.  Suboptimal control of blood sugar  PLAN: 1.  Continue nystatin/triamcinolone cream topically to the vulva for another 14 days-daily 2.  Continue working with Dr. Hall Busing on blood sugar control 3.  Return in 4 weeks for follow-up  A total of 15 minutes were spent face-to-face with the patient during this encounter and over half of that time dealt with counseling and coordination of care.  Annette Mars, MD  Note: This dictation was prepared with Dragon dictation along with smaller phrase technology. Any  transcriptional errors that result from this process are unintentional.

## 2016-12-16 ENCOUNTER — Ambulatory Visit: Payer: Medicare Other | Attending: Urology | Admitting: Physical Therapy

## 2016-12-16 DIAGNOSIS — R279 Unspecified lack of coordination: Secondary | ICD-10-CM

## 2016-12-16 DIAGNOSIS — R278 Other lack of coordination: Secondary | ICD-10-CM | POA: Diagnosis present

## 2016-12-16 DIAGNOSIS — M25512 Pain in left shoulder: Secondary | ICD-10-CM | POA: Insufficient documentation

## 2016-12-16 DIAGNOSIS — G8929 Other chronic pain: Secondary | ICD-10-CM | POA: Diagnosis present

## 2016-12-16 DIAGNOSIS — M791 Myalgia, unspecified site: Secondary | ICD-10-CM

## 2016-12-16 DIAGNOSIS — R2689 Other abnormalities of gait and mobility: Secondary | ICD-10-CM | POA: Diagnosis present

## 2016-12-17 ENCOUNTER — Other Ambulatory Visit: Payer: Self-pay | Admitting: *Deleted

## 2016-12-17 MED ORDER — LETROZOLE 2.5 MG PO TABS: 2.5000 mg | ORAL_TABLET | Freq: Every day | ORAL | 0 refills | Status: DC

## 2016-12-17 NOTE — Therapy (Signed)
[place] MAIN [facility] SERVICES [address] [place], [place], [address] Phone: [phone]   Fax:  [phone]  Physical Therapy Treatment  Patient Details  Name: Annette Foster MRN: [id number] Date of Birth: 1940/06/01 No Data Recorded  Encounter Date: 12/16/2016  PT End of Session - 12/17/16 2257    Visit Number  4    Number of Visits  12    Date for PT Re-Evaluation  02/03/17    Authorization Type  g code    Activity Tolerance  No increased pain;Patient tolerated treatment well    Behavior During Therapy  [facility] for tasks assessed/performed       Past Medical History:  Diagnosis Date  . Abdominal pain   . Allergy   . Cancer [facility]) 2017   breast cancer- Left  . Cataract   . CHF (congestive heart failure) ([place])   . Collagenous colitis   . Diabetes mellitus without complication ([place])   . Diarrhea   . Diverticulosis   . Fibrocystic breast   . GERD (gastroesophageal reflux disease)   . Heart murmur   . Hyperlipidemia   . Hypertension   . Hypothyroidism   . IBS (irritable bowel syndrome)   . IBS (irritable bowel syndrome)   . IDA (iron deficiency anemia)   . Personal history of radiation therapy   . PONV (postoperative nausea and vomiting)   . Sleep apnea    C-Pap    Past Surgical History:  Procedure Laterality Date  . ABDOMINAL HYSTERECTOMY     tah bso  . ABDOMINAL SURGERY    . APPENDECTOMY    . BREAST BIOPSY Bilateral    negative  . 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 BREAST BIOPSY Left 05/27/2016   neg  . CARDIAC SURGERY    . CATARACT EXTRACTION Right   . CHOLECYSTECTOMY    . JOINT REPLACEMENT Left    TKR  . left sinusplasty     . TOTAL ABDOMINAL HYSTERECTOMY W/ BILATERAL SALPINGOOPHORECTOMY      There were no vitals filed for this visit.  Subjective Assessment - 12/17/16 2315    Subjective  Pt reported she found out she had another UTI last week with burning and frequency. Pt  has been stressed the past 2 weeks because her brother in law has been in hospice.   Pt 's gynecological Hx: 2 vaginal deliveries, epiosiotomies both times. partial and total hysterecomy, and abdominal surgeries      Pertinent History  Pt is waiting on a bone marrow biopsy because her oncologist was concerned about the recent epsiodes of night sweats and blood count drop. Pt will be calling back her clinic this week. Pt is also following up with her PCP Dr. Hall Busing about her blood sugar levels which has been high even though she has cut back on her sugars.   Pt has had heart surgery in 1972. Pt exercises with the cardiorehab program.           [facility] PT Assessment - 12/16/16 1552      Coordination   Gross Motor Movements are Fluid and Coordinated  -- abdominal straining w/ exhalation    abdominal straining w/ exhalation      Palpation   Palpation comment  abdominal scar adhesions, tenderness/pain ar pubic symphysis B, fascial                Pelvic Floor Special Questions - 12/17/16 2252  External Perineal Exam  suprapubic fascial tensions B, poor activation of pelvic floor mm, vocal toning / foot co-activation elicited pelvic contraction   increased tensions at ischiocavernosus/ bulbospongiosus L >R   increased tensions at ischiocavernosus/ bulbospongiosus L >R   External Palpation  moderate cues to not push abdomen mm onto pelvic floor mm    Pelvic Floor Internal Exam  --    Exam Type  --    Palpation  --    Strength  --    Strength # of reps  --    Strength # of seconds  --        [facility] Adult PT Treatment/Exercise - 12/17/16 0001      Neuro Re-ed    Neuro Re-ed Details   instructions with vocal toning to relax abdominal mm with exhalation instead of straining abdominal mm       Manual Therapy   Manual therapy comments  fascial releases over supra pubic area, gentle sacr releases over abdominal scar              PT Education - 12/17/16 2251    Education provided   Yes    Education Details  HEP    Person(s) Educated  Patient    Methods  Explanation;Demonstration;Tactile cues;Verbal cues    Comprehension  Verbalized understanding;Returned demonstration;Verbal cues required;Tactile cues required          PT Long Term Goals - 12/17/16 2313      PT LONG TERM GOAL #1   Title  Pt will increase her score on PSFS for "turning over bed" from 0 to > 5pts, "walking an hour from 1 pt to > 3 pts, and excerising an hour form 1 pt to > 3 pts in order to return to activities    Baseline  "turning over bed" from 0 to > 5pts ( 10/22: 6-7 pts),  "walking an hour from 1 pt to > 3 pts  ( 10/22: 8 pts)  , excerising an hour form 1 pt to > 3 pts ( n/a because she has not been called for silver sneakers class)      Time  12    Period  Weeks    Status  Partially Met      PT LONG TERM GOAL #2   Title  Pt will demo decreased fascial and mm tensions over suprapubic , bulbospongiosus, ischiocavernosus B in order to improve pelvic floor function    Time  4    Period  Weeks    Status  Partially Met      PT LONG TERM GOAL #3   Title  Pt will demo decreased paraspinal mm tensions and increased spinal mobility in order to promote deep core coordination and function     Time  2    Period  Weeks    Status  Achieved      PT LONG TERM GOAL #4   Title  Pt will demo proper pelvic floor coordination and lengthening with fitness exercises to minimize relapse of Sx    Time  4    Period  Weeks    Status  Partially Met            Plan - 12/17/16 2258    Clinical Impression Statement  Pt arrived to PT today with report of having a UTI. Internal assessment of pelvic floor mm is withheld until UTI resolves.  External assessment showed restricted mobility over suprapubic area, bulbospongiosus/ ischiocavernosus L > R.  Pt  demo'd improved fascial mobility over suprapubic area and increased pelvic floor coordination/ activation following intravaginal assessment and Tx. Pt required  moderate cues for decreasing straining of abdominal mm with pelvic floor coordination and alternative ways to elicit activation of pelvic floor mm with co-activation of glottal diaphragm and feet muscles. Pt continues to benefit from skilled PT.      Rehab Potential  Good    PT Frequency  1x / week    PT Duration  12 weeks    PT Treatment/Interventions  Therapeutic activities;Therapeutic exercise;Moist Heat;Traction;Stair training;Gait training;Neuromuscular re-education;Balance training;Patient/family education;Manual techniques;Manual lymph drainage;Energy conservation;Passive range of motion;Functional mobility training    Consulted and Agree with Plan of Care  Patient       Patient will benefit from skilled therapeutic intervention in order to improve the following deficits and impairments:  Abnormal gait, Increased muscle spasms, Postural dysfunction, Improper body mechanics, Decreased scar mobility, Decreased range of motion, Decreased safety awareness, Decreased strength, Decreased balance, Decreased mobility, Difficulty walking, Decreased coordination, Hypomobility, Decreased endurance  Visit Diagnosis: Myalgia  Other lack of coordination  Unspecified lack of coordination  Other abnormalities of gait and mobility  Chronic left shoulder pain     Problem List Patient Active Problem List   Diagnosis Date Noted  . Other pancytopenia ([place]) 11/14/2016  . 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 Status post hysterectomy 06/20/2016  . 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 Vaginal atrophy 01/12/2016  . 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    Jerl Mina ,PT, DPT, E-RYT  12/17/2016, 11:15 PM  [place] MAIN [facility] SERVICES [address] [place], [place], [address] Phone: [phone]   Fax:  [phone]  Name: Annette Foster MRN: [id number] Date of  Birth: May 19, 1940

## 2016-12-17 NOTE — Patient Instructions (Signed)
Practice breathing without pushing stomach on pelvic floor

## 2016-12-23 ENCOUNTER — Ambulatory Visit: Payer: Medicare Other | Admitting: Radiation Oncology

## 2016-12-25 ENCOUNTER — Other Ambulatory Visit: Payer: Self-pay

## 2016-12-25 ENCOUNTER — Encounter: Payer: Self-pay | Admitting: Radiation Oncology

## 2016-12-25 ENCOUNTER — Ambulatory Visit
Admission: RE | Admit: 2016-12-25 | Discharge: 2016-12-25 | Disposition: A | Discharge status: Home / Self Care | Payer: Medicare Other | Source: Ambulatory Visit | Attending: Radiation Oncology | Admitting: Radiation Oncology

## 2016-12-25 ENCOUNTER — Telehealth: Payer: Self-pay | Admitting: Obstetrics and Gynecology

## 2016-12-25 VITALS — BP 142/63 | HR 72 | Temp 96.7°F | Resp 20 | Wt 175.8 lb

## 2016-12-25 DIAGNOSIS — Z170 Estrogen receptor positive status [ER+]: Secondary | ICD-10-CM | POA: Diagnosis not present

## 2016-12-25 DIAGNOSIS — D61818 Other pancytopenia: Secondary | ICD-10-CM | POA: Insufficient documentation

## 2016-12-25 DIAGNOSIS — D0512 Intraductal carcinoma in situ of left breast: Secondary | ICD-10-CM | POA: Insufficient documentation

## 2016-12-25 DIAGNOSIS — Z87440 Personal history of urinary (tract) infections: Secondary | ICD-10-CM | POA: Diagnosis not present

## 2016-12-25 DIAGNOSIS — Z923 Personal history of irradiation: Secondary | ICD-10-CM | POA: Diagnosis not present

## 2016-12-25 DIAGNOSIS — Z79811 Long term (current) use of aromatase inhibitors: Secondary | ICD-10-CM | POA: Diagnosis not present

## 2016-12-25 NOTE — Telephone Encounter (Signed)
Pt states she completed atb for uti but is still in pain. Appt made for tomorrow at 8:30.

## 2016-12-25 NOTE — Telephone Encounter (Signed)
The patient came into the office and stated that her medication for her UTI did not help her symptoms. (Cefuroxime/Axetil 500 MG Tablet) The patient is currently in the area and is okay with stopping by to drop off a specimen. No other information was disclosed. Please advise.

## 2016-12-25 NOTE — Progress Notes (Signed)
Radiation Oncology Follow up Note  Name: Annette Foster   Date:   12/25/2016 MRN:  [id number] DOB: 1940/08/31    This 76 y.o. female pres 1 year follow-up status post whole breast radiation to her left breast for ER/PR positive ductal carcinoma in situ ents to the clinic today for   REFERRING PROVIDER: Albina Billet, MD  HPI: Patient is a 76 year old female now seen out 1 year having completed whole breast radiation to her left breast for ER/PR positive ductal carcinoma in situ status post wide local excision. Seen today in routine follow-up she is doing well. Her big complaint is recurrent urinary tract infections which she been tried of multiple courses of antibiotic therapy. She specifically denies breast tenderness cough or bone pain.. She is currently on Femara tolerating that well without side effect. Her last mammogram was back in August BI-RADS 2 benign which I have reviewed. She has had a bone marrow biopsy which is pending for mild pancytopenia being followed by medical oncology.  COMPLICATIONS OF TREATMENT: none  FOLLOW UP COMPLIANCE: keeps appointments   PHYSICAL EXAM:  BP (!) 142/63   Pulse 72   Temp (!) 96.7 F (35.9 C)   Resp 20   Wt 175 lb 13.1 oz (79.8 kg)   LMP  (LMP Unknown)   BMI 31.14 kg/m  Lungs are clear to A&P cardiac examination essentially unremarkable with regular rate and rhythm. No dominant mass or nodularity is noted in either breast in 2 positions examined. Incision is well-healed. No axillary or supraclavicular adenopathy is appreciated. Cosmetic result is excellent. Well-developed well-nourished patient in NAD. HEENT reveals PERLA, EOMI, discs not visualized.  Oral cavity is clear. No oral mucosal lesions are identified. Neck is clear without evidence of cervical or supraclavicular adenopathy. Lungs are clear to A&P. Cardiac examination is essentially unremarkable with regular rate and rhythm without murmur rub or thrill. Abdomen is benign with no  organomegaly or masses noted. Motor sensory and DTR levels are equal and symmetric in the upper and lower extremities. Cranial nerves II through XII are grossly intact. Proprioception is intact. No peripheral adenopathy or edema is identified. No motor or sensory levels are noted. Crude visual fields are within normal range.  RADIOLOGY RESULTS: Mammograms are reviewed and compatible with the above-stated findings  PLAN: Present time she continues to do well with no evidence of disease in her breast. She has pathology of her bone marrow biopsy pending. I've also suggested possible urology consultation should her urinary tract infections continue to recur. Otherwise I have asked to see her back in 1 year for follow-up. She knows to call sooner with any concerns.  I would like to take this opportunity to thank you for allowing me to participate in the care of your patient.Armstead Peaks., MD

## 2016-12-26 ENCOUNTER — Ambulatory Visit (INDEPENDENT_AMBULATORY_CARE_PROVIDER_SITE_OTHER): Payer: Medicare Other | Admitting: Obstetrics and Gynecology

## 2016-12-26 ENCOUNTER — Encounter: Payer: Self-pay | Admitting: Obstetrics and Gynecology

## 2016-12-26 VITALS — BP 139/57 | HR 69 | Temp 97.8°F | Ht 63.0 in | Wt 174.3 lb

## 2016-12-26 DIAGNOSIS — B373 Candidiasis of vulva and vagina: Secondary | ICD-10-CM

## 2016-12-26 DIAGNOSIS — R3129 Other microscopic hematuria: Secondary | ICD-10-CM

## 2016-12-26 DIAGNOSIS — N952 Postmenopausal atrophic vaginitis: Secondary | ICD-10-CM

## 2016-12-26 DIAGNOSIS — R300 Dysuria: Secondary | ICD-10-CM | POA: Diagnosis not present

## 2016-12-26 DIAGNOSIS — B3731 Acute candidiasis of vulva and vagina: Secondary | ICD-10-CM

## 2016-12-26 DIAGNOSIS — D0512 Intraductal carcinoma in situ of left breast: Secondary | ICD-10-CM

## 2016-12-26 LAB — POCT URINALYSIS DIPSTICK
Bilirubin, UA: NEGATIVE
Glucose, UA: NEGATIVE
Ketones, UA: NEGATIVE
Nitrite, UA: NEGATIVE
Spec Grav, UA: 1.01 (ref 1.010–1.025)
Urobilinogen, UA: 0.2 E.U./dL
pH, UA: 7 (ref 5.0–8.0)

## 2016-12-26 MED ORDER — SULFAMETHOXAZOLE-TRIMETHOPRIM 800-160 MG PO TABS: 1.0000 | ORAL_TABLET | Freq: Two times a day (BID) | ORAL | 0 refills | Status: DC

## 2016-12-26 NOTE — Progress Notes (Signed)
Chief complaint: 1.  Dysuria 2.  History of 3 UTIs in the past month  Patient presents for evaluation of recurrent UTI symptoms.  She was treated with antibiotics on 11/18/2016 and 12/11/2016.  Most recent antibiotic was Ceftin for 14 days. She still reports having postvoid urgency with burning and irritation which sends chills upper extremities.  She is not having any documented fevers. Urinalysis in the office today is notable for red blood cells and glucose. Diabetes is suboptimally controlled. Other conditions include chronic vulvitis that is biopsy verified as a monilia dermatitis and she is completing a four-week course of nystatin/triamcinolone cream topically twice a day to the vulva.  Past medical history, past surgical history, problem list, medications, and allergies are reviewed  Review of systems: Comprehensive review of systems is negative except for that noted in the HPI  OBJECTIVE: BP (!) 139/57   Pulse 69   Temp 97.8 F (36.6 C)   Ht 5' 3"  (1.6 m)   Wt 174 lb 4.8 oz (79.1 kg)   LMP  (LMP Unknown)   BMI 30.88 kg/m  Pleasant female in no acute distress.  Alert and oriented. Back: No CVA tenderness Abdomen: Soft, nontender without organomegaly Pelvic exam: External genitalia-minimal vulvar hyperemia and perianal chronic skin changes without leukoplakia or epithelial skin breakdown BUS-normal Vagina-moderate to market atrophy; good vault support Bimanual-no significant masses or tenderness Rectovaginal-external exam is noted with resolving perianal hyperemia  Urinalysis: 2+ leukocytes; moderate blood; 1+ protein ASSESSMENT: 1.  UTI symptoms with hematuria on UA 2.  History of possible recurrent UTI x3 in the past month; cultures have showed multiple organisms without dominant bacteria 3.  Chronic monilia vulvitis, resolving 4.  Suboptimal control of diabetes 5.  History of breast cancer-DCIS  PLAN: 1.  Urine culture is sent 2.  Urology referral 3.  Complete  course of nystatin/triamcinolone cream topically to the vulva over 4 weeks as directed 4.  Return in 4 weeks for follow-up  A total of 15 minutes were spent face-to-face with the patient during this encounter and over half of that time dealt with counseling and coordination of care.  Brayton Mars, MD  Note: This dictation was prepared with Dragon dictation along with smaller phrase technology. Any transcriptional errors that result from this process are unintentional.

## 2016-12-26 NOTE — Patient Instructions (Addendum)
1.  Take Septra DS twice daily for 7 days 2.  Maintain water intake and cranberry juice or tablet intake to help with suspected UTI 3.  Urology referral is given for recurrent UTI  4.  Return in 4 weeks for follow-up on chronic vulvitis

## 2016-12-28 LAB — URINE CULTURE

## 2016-12-30 ENCOUNTER — Ambulatory Visit: Payer: Medicare Other | Admitting: Physical Therapy

## 2016-12-30 ENCOUNTER — Telehealth: Payer: Self-pay

## 2016-12-30 DIAGNOSIS — M791 Myalgia, unspecified site: Secondary | ICD-10-CM | POA: Diagnosis not present

## 2016-12-30 DIAGNOSIS — R278 Other lack of coordination: Secondary | ICD-10-CM

## 2016-12-30 DIAGNOSIS — R2689 Other abnormalities of gait and mobility: Secondary | ICD-10-CM

## 2016-12-30 DIAGNOSIS — R279 Unspecified lack of coordination: Secondary | ICD-10-CM

## 2016-12-30 MED ORDER — NITROFURANTOIN MONOHYD MACRO 100 MG PO CAPS: 100.0000 mg | ORAL_CAPSULE | Freq: Two times a day (BID) | ORAL | 0 refills | Status: DC

## 2016-12-30 NOTE — Telephone Encounter (Signed)
Pt aware. Med erx. 

## 2016-12-30 NOTE — Telephone Encounter (Signed)
-----   Message from Brayton Mars, MD sent at 12/30/2016 12:43 PM EST ----- Please notify - Abnormal Labs Please call in Macrobid 2x daily x 7 days

## 2016-12-31 NOTE — Therapy (Addendum)
[place] MAIN [facility] SERVICES [address] [place], [place], [address] Phone: [phone]   Fax:  [phone]  Physical Therapy Treatment / Progress Note  Patient Details  Name: Annette Foster MRN: [id number] Date of Birth: May 01, 1940 No Data Recorded  Encounter Date: 12/30/2016  PT End of Session - 12/30/16 1530    Visit Number  5    Number of Visits  12    Date for PT Re-Evaluation  02/03/17    Authorization Type  g code    PT Start Time  0017    PT Stop Time  1130    PT Time Calculation (min)  25 min    Activity Tolerance  No increased pain;Patient tolerated treatment well    Behavior During Therapy  [place] for tasks assessed/performed       Past Medical History:  Diagnosis Date  . Abdominal pain   . Allergy   . Cancer [facility]) 2017   breast cancer- Left  . Cataract   . CHF (congestive heart failure) ([place])   . Collagenous colitis   . Diabetes mellitus without complication ([place])   . Diarrhea   . Diverticulosis   . Fibrocystic breast   . GERD (gastroesophageal reflux disease)   . Heart murmur   . Hyperlipidemia   . Hypertension   . Hypothyroidism   . IBS (irritable bowel syndrome)   . IBS (irritable bowel syndrome)   . IDA (iron deficiency anemia)   . Personal history of radiation therapy   . PONV (postoperative nausea and vomiting)   . Sleep apnea    C-Pap    Past Surgical History:  Procedure Laterality Date  . ABDOMINAL HYSTERECTOMY     tah bso  . ABDOMINAL SURGERY    . APPENDECTOMY    . BREAST BIOPSY Bilateral    negative  . 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 BREAST BIOPSY Left 05/27/2016   neg  . CARDIAC SURGERY    . CATARACT EXTRACTION Right   . CHOLECYSTECTOMY    . COLONOSCOPY WITH PROPOFOL N/A 03/11/2016   Procedure: COLONOSCOPY WITH PROPOFOL;  Surgeon: Manya Silvas, MD;  Location: [facility] ENDOSCOPY;  Service: Endoscopy;  Laterality: N/A;  . ESOPHAGOGASTRODUODENOSCOPY (EGD)  WITH PROPOFOL N/A 03/11/2016   Procedure: ESOPHAGOGASTRODUODENOSCOPY (EGD) WITH PROPOFOL;  Surgeon: Manya Silvas, MD;  Location: [facility] ENDOSCOPY;  Service: Endoscopy;  Laterality: N/A;  . JOINT REPLACEMENT Left    TKR  . left sinusplasty     . MASTECTOMY, PARTIAL Left 10/17/2015   Procedure: MASTECTOMY PARTIAL REVISION;  Surgeon: Leonie Green, MD;  Location: [facility] ORS;  Service: General;  Laterality: Left;  . PARTIAL MASTECTOMY WITH NEEDLE LOCALIZATION Left 09/29/2015   Procedure: PARTIAL MASTECTOMY WITH NEEDLE LOCALIZATION;  Surgeon: Leonie Green, MD;  Location: [facility] ORS;  Service: General;  Laterality: Left;  . TOTAL ABDOMINAL HYSTERECTOMY W/ BILATERAL SALPINGOOPHORECTOMY      There were no vitals filed for this visit.  Subjective Assessment - 12/30/16 1514    Subjective  Pt reported she has lost weight in her abdominal area and is excited to go get a belt for her jeans that she has not been able to wear for 2 years. Pt is doing better with emptying her bladder with breathing.  Pt reports she completed her antibiotics for one UTI but she is taking another round because she was told her UTI has not resolved.  Pt will be will referred by  her doctors to a urologist.      Pertinent History  Pt is waiting on a bone marrow biopsy because her oncologist was concerned about the recent epsiodes of night sweats and blood count drop. Pt will be calling back her clinic this week. Pt is also following up with her PCP Dr. Hall Busing about her blood sugar levels which has been high even though she has cut back on her sugars.   Pt has had heart surgery in 1972. Pt exercises with the cardiorehab program.           [facility] PT Assessment - 12/31/16 2122      Observation/Other Assessments   Observations  more upright posture, less thoracic kyphosis      Palpation   Palpation comment  stronger abdominal muscles       Ambulation/Gait   Gait Comments  swifter gait, and longer stride                Pelvic Floor Special Questions - 12/31/16 2127    External Perineal Exam  no abdominal straining with cue for pelvic floor contraction, proper lengthening of pelvic floor mm. seated: chest breathing, required cues for diaphramgatic breathing in simulated position with foot stool.         [place] Adult PT Treatment/Exercise - 12/31/16 2122      Therapeutic Activites    Therapeutic Activities  -- reassessed goals, seated breathing for toileting                  PT Long Term Goals - 12/30/16 1525      PT LONG TERM GOAL #1   Title  Pt will increase her score on PSFS for "turning over bed" from 0 to > 5pts, "walking an hour from 1 pt to > 3 pts, and excerising an hour form 1 pt to > 3 pts in order to return to activities    Baseline  "turning over bed" from 0 to > 5pts ( 10/22: 6-7 pts, 11/19: 9/10),  "walking an hour from 1 pt to > 3 pts  ( 10/22: 8 pts, 11/119: 10/10)  , excerising an hour from 1 pt to > 3 pts ( n/a because she has not been called for silver sneakers class)      Time  12    Period  Weeks    Status  Achieved      PT LONG TERM GOAL #2   Title  Pt will demo decreased fascial and mm tensions over suprapubic , bulbospongiosus, ischiocavernosus B in order to improve pelvic floor function    Time  4    Period  Weeks    Status  Achieved      PT LONG TERM GOAL #3   Title  Pt will demo decreased paraspinal mm tensions and increased spinal mobility in order to promote deep core coordination and function     Time  2    Period  Weeks    Status  Achieved      PT LONG TERM GOAL #4   Title  Pt will demo proper pelvic floor coordination and lengthening with fitness exercises to minimize relapse of Sx    Time  4    Period  Weeks    Status  Achieved      PT LONG TERM GOAL #5   Title  Pt will report being to eliminate urine with decreased trials from 3x to 1x across 75% of the time in order to improve  pelvic floor function and minimize UTI    Time  8     Period  Weeks    Status  New    Target Date  02/25/17            Plan - 12/31/16 2133-04-26    Clinical Impression Statement Pt has acheived 4/5 goals with improved LBP which has allowed her to turn over in bed without pain. Pt has less difficulty with elimination of urine and has demo'd improved coordination without straining with her abdominal mm. Pt also demo'd improved posture with less forward head , less thoracic kyphosis improved gait.   Pt demo'd today with less pelvic floor tightness externally but internal pelvic floor mm has not been performed due to pt's report of UTIs. Suspect internal assessment will be beneficial for pt as possible scar adhesions may play a role in fascial mobility of pelvic floor mm surrounding urethra sphincter 2/2 abdominal hysterectomy.  Pt will be consulting her urologist re: reoccurrence of UTIs. Plan to communicate with her provider re: POC. Pt continues to benefit from skilled PT as long as pt has musculoskeletal deficits contributing to her recurrent UTIs.    Today's session was abbreviated due to withholding internal assessment until pt has an appt with urologist. PT tried calling urologist clinic to help pt get a quicker appointment with her urologist but phone was busy. Escorted pt to urologist's clinic and pt was able to get an appointment.    Rehab Potential  Good    PT Frequency  1x / week    PT Duration  12 weeks    PT Treatment/Interventions  Therapeutic activities;Therapeutic exercise;Moist Heat;Traction;Stair training;Gait training;Neuromuscular re-education;Balance training;Patient/family education;Manual techniques;Manual lymph drainage;Energy conservation;Passive range of motion;Functional mobility training    Consulted and Agree with Plan of Care  Patient       Patient will benefit from skilled therapeutic intervention in order to improve the following deficits and impairments:  Abnormal gait, Increased muscle spasms, Postural dysfunction,  Improper body mechanics, Decreased scar mobility, Decreased range of motion, Decreased safety awareness, Decreased strength, Decreased balance, Decreased mobility, Difficulty walking, Decreased coordination, Hypomobility, Decreased endurance  Visit Diagnosis: Myalgia  Other lack of coordination  Unspecified lack of coordination  Other abnormalities of gait and mobility   G-Codes - 01-08-17 Apr 27, 2142    Functional Assessment Tool Used (Outpatient Only)  Clincal judgements, PSFS    Functional Limitation  Mobility: Walking and moving around    Mobility: Walking and Moving Around Current Status ([id number])  At least 1 percent but less than 20 percent impaired, limited or restricted    Mobility: Walking and Moving Around Goal Status [phone])  At least 1 percent but less than 20 percent impaired, limited or restricted       Problem List Patient Active Problem List   Diagnosis Date Noted  . Other pancytopenia ([place]) 11/14/2016  . 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 Status post hysterectomy 06/20/2016  . 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 Vaginal atrophy 01/12/2016  . 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 [place], [place], [address] Phone: [phone]   Fax:  [phone]  Name: Annette Foster MRN: [id number] Date of Birth: 12-14-1940

## 2016-12-31 NOTE — Addendum Note (Signed)
Addended by: Jerl Mina on: 12/31/2016 09:19 PM   Modules accepted: Orders

## 2017-01-01 ENCOUNTER — Encounter: Payer: Medicare Other | Admitting: Obstetrics and Gynecology

## 2017-01-10 ENCOUNTER — Other Ambulatory Visit: Payer: Self-pay

## 2017-01-10 ENCOUNTER — Emergency Department: Payer: Medicare Other

## 2017-01-10 ENCOUNTER — Encounter: Payer: Self-pay | Admitting: Emergency Medicine

## 2017-01-10 ENCOUNTER — Observation Stay
Admission: EM | Admit: 2017-01-10 | Discharge: 2017-01-12 | Disposition: A | Discharge status: Home / Self Care | Payer: Medicare Other | Attending: Internal Medicine | Admitting: Internal Medicine

## 2017-01-10 DIAGNOSIS — J90 Pleural effusion, not elsewhere classified: Secondary | ICD-10-CM | POA: Insufficient documentation

## 2017-01-10 DIAGNOSIS — I509 Heart failure, unspecified: Secondary | ICD-10-CM | POA: Diagnosis not present

## 2017-01-10 DIAGNOSIS — N390 Urinary tract infection, site not specified: Secondary | ICD-10-CM | POA: Insufficient documentation

## 2017-01-10 DIAGNOSIS — Z8719 Personal history of other diseases of the digestive system: Secondary | ICD-10-CM | POA: Insufficient documentation

## 2017-01-10 DIAGNOSIS — R197 Diarrhea, unspecified: Secondary | ICD-10-CM

## 2017-01-10 DIAGNOSIS — B9620 Unspecified Escherichia coli [E. coli] as the cause of diseases classified elsewhere: Secondary | ICD-10-CM | POA: Insufficient documentation

## 2017-01-10 DIAGNOSIS — K589 Irritable bowel syndrome without diarrhea: Secondary | ICD-10-CM | POA: Insufficient documentation

## 2017-01-10 DIAGNOSIS — G4730 Sleep apnea, unspecified: Secondary | ICD-10-CM | POA: Diagnosis not present

## 2017-01-10 DIAGNOSIS — E119 Type 2 diabetes mellitus without complications: Secondary | ICD-10-CM

## 2017-01-10 DIAGNOSIS — R109 Unspecified abdominal pain: Secondary | ICD-10-CM

## 2017-01-10 DIAGNOSIS — E876 Hypokalemia: Secondary | ICD-10-CM | POA: Diagnosis not present

## 2017-01-10 DIAGNOSIS — Z79899 Other long term (current) drug therapy: Secondary | ICD-10-CM | POA: Diagnosis not present

## 2017-01-10 DIAGNOSIS — K219 Gastro-esophageal reflux disease without esophagitis: Secondary | ICD-10-CM | POA: Diagnosis not present

## 2017-01-10 DIAGNOSIS — I10 Essential (primary) hypertension: Secondary | ICD-10-CM | POA: Diagnosis present

## 2017-01-10 DIAGNOSIS — E785 Hyperlipidemia, unspecified: Secondary | ICD-10-CM | POA: Diagnosis not present

## 2017-01-10 DIAGNOSIS — E860 Dehydration: Secondary | ICD-10-CM | POA: Diagnosis present

## 2017-01-10 DIAGNOSIS — E1140 Type 2 diabetes mellitus with diabetic neuropathy, unspecified: Secondary | ICD-10-CM | POA: Diagnosis not present

## 2017-01-10 DIAGNOSIS — R188 Other ascites: Secondary | ICD-10-CM | POA: Insufficient documentation

## 2017-01-10 DIAGNOSIS — I110 Hypertensive heart disease with heart failure: Secondary | ICD-10-CM | POA: Insufficient documentation

## 2017-01-10 DIAGNOSIS — Z881 Allergy status to other antibiotic agents status: Secondary | ICD-10-CM | POA: Insufficient documentation

## 2017-01-10 DIAGNOSIS — Z87440 Personal history of urinary (tract) infections: Secondary | ICD-10-CM | POA: Diagnosis not present

## 2017-01-10 DIAGNOSIS — K7581 Nonalcoholic steatohepatitis (NASH): Secondary | ICD-10-CM | POA: Insufficient documentation

## 2017-01-10 DIAGNOSIS — Z853 Personal history of malignant neoplasm of breast: Secondary | ICD-10-CM | POA: Insufficient documentation

## 2017-01-10 DIAGNOSIS — Z885 Allergy status to narcotic agent status: Secondary | ICD-10-CM | POA: Diagnosis not present

## 2017-01-10 DIAGNOSIS — Z90710 Acquired absence of both cervix and uterus: Secondary | ICD-10-CM | POA: Insufficient documentation

## 2017-01-10 DIAGNOSIS — E039 Hypothyroidism, unspecified: Secondary | ICD-10-CM | POA: Diagnosis present

## 2017-01-10 DIAGNOSIS — K52831 Collagenous colitis: Secondary | ICD-10-CM | POA: Diagnosis not present

## 2017-01-10 DIAGNOSIS — Z888 Allergy status to other drugs, medicaments and biological substances status: Secondary | ICD-10-CM | POA: Diagnosis not present

## 2017-01-10 DIAGNOSIS — R531 Weakness: Secondary | ICD-10-CM

## 2017-01-10 DIAGNOSIS — Z7982 Long term (current) use of aspirin: Secondary | ICD-10-CM | POA: Insufficient documentation

## 2017-01-10 DIAGNOSIS — R112 Nausea with vomiting, unspecified: Secondary | ICD-10-CM | POA: Diagnosis present

## 2017-01-10 DIAGNOSIS — W19XXXA Unspecified fall, initial encounter: Secondary | ICD-10-CM | POA: Diagnosis not present

## 2017-01-10 DIAGNOSIS — Z794 Long term (current) use of insulin: Secondary | ICD-10-CM | POA: Insufficient documentation

## 2017-01-10 LAB — COMPREHENSIVE METABOLIC PANEL
ALT: 28 U/L (ref 14–54)
AST: 47 U/L — ABNORMAL HIGH (ref 15–41)
Albumin: 3 g/dL — ABNORMAL LOW (ref 3.5–5.0)
Alkaline Phosphatase: 75 U/L (ref 38–126)
Anion gap: 10 (ref 5–15)
BUN: 17 mg/dL (ref 6–20)
CO2: 26 mmol/L (ref 22–32)
Calcium: 8.5 mg/dL — ABNORMAL LOW (ref 8.9–10.3)
Chloride: 98 mmol/L — ABNORMAL LOW (ref 101–111)
Creatinine, Ser: 0.67 mg/dL (ref 0.44–1.00)
GFR calc Af Amer: >60 mL/min (ref >60)
GFR calc non Af Amer: >60 mL/min (ref >60)
Glucose, Bld: 150 mg/dL — ABNORMAL HIGH (ref 65–99)
Potassium: 3.4 mmol/L — ABNORMAL LOW (ref 3.5–5.1)
Sodium: 134 mmol/L — ABNORMAL LOW (ref 135–145)
Total Bilirubin: 0.7 mg/dL (ref 0.3–1.2)
Total Protein: 6.9 g/dL (ref 6.5–8.1)

## 2017-01-10 LAB — CBC
HCT: 30.9 % — ABNORMAL LOW (ref 35.0–47.0)
Hemoglobin: 10.3 g/dL — ABNORMAL LOW (ref 12.0–16.0)
MCH: 28.6 pg (ref 26.0–34.0)
MCHC: 33.2 g/dL (ref 32.0–36.0)
MCV: 86.2 fL (ref 80.0–100.0)
Platelets: 127 10*3/uL — ABNORMAL LOW (ref 150–440)
RBC: 3.59 MIL/uL — ABNORMAL LOW (ref 3.80–5.20)
RDW: 15.2 % — ABNORMAL HIGH (ref 11.5–14.5)
WBC: 5.7 10*3/uL (ref 3.6–11.0)

## 2017-01-10 LAB — LIPASE, BLOOD: Lipase: 45 U/L (ref 11–51)

## 2017-01-10 MED ORDER — SODIUM CHLORIDE 0.9 % IV BOLUS (SEPSIS)
1000.0000 mL | Freq: Once | INTRAVENOUS | Status: AC
  Administered 2017-01-10: 1000 mL via INTRAVENOUS

## 2017-01-10 MED ORDER — IOPAMIDOL (ISOVUE-300) INJECTION 61%
30.0000 mL | Freq: Once | INTRAVENOUS | Status: AC | PRN
  Administered 2017-01-10: 30 mL via ORAL

## 2017-01-10 MED ORDER — PROMETHAZINE HCL 25 MG/ML IJ SOLN
12.5000 mg | Freq: Once | INTRAMUSCULAR | Status: AC
  Administered 2017-01-10: 12.5 mg via INTRAVENOUS
  Filled 2017-01-10: qty 1

## 2017-01-10 MED ORDER — IOPAMIDOL (ISOVUE-300) INJECTION 61%
100.0000 mL | Freq: Once | INTRAVENOUS | Status: AC | PRN
  Administered 2017-01-10: 100 mL via INTRAVENOUS

## 2017-01-10 NOTE — ED Notes (Signed)
Pt assisted up to commode for urination.

## 2017-01-10 NOTE — ED Notes (Signed)
Pt updated on admission process. Pt verbalizes understanding.  

## 2017-01-10 NOTE — ED Notes (Signed)
Pt to ct scan.

## 2017-01-10 NOTE — ED Notes (Signed)
Pt updated on delay

## 2017-01-10 NOTE — ED Triage Notes (Signed)
Pt presents to ED via POV with c/o emesis and diarrhea x 2 weeks. Pt states was sent by GI doctor for further evaluation. Pt states hx of C-diff, collaginous colitis, and IBS. Pt reports has been taking Zofran without relief.

## 2017-01-10 NOTE — ED Notes (Signed)
Warm blankets provided. Pt denies needs.

## 2017-01-10 NOTE — ED Provider Notes (Signed)
[facility] Emergency Department Provider Note   ____________________________________________   First MD Initiated Contact with Patient 01/10/17 1610     (approximate)  I have reviewed the triage vital signs and the nursing notes.   HISTORY  Chief Complaint Emesis and Diarrhea   HPI Annette Foster is a 76 y.o. female Who complains of 2 weeks of nausea vomiting and diarrhea. She's been vomiting about twice a day and diarrhea about twice a day and she feels like she is dehydrated. She says usually when she is like that she has to get 2 L of fluid IV. 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 She reports yesterday she got weak and lightheaded and fell and hit her belly and now has belly pain.her belly is also somewhat distended. She's had C. difficile but thinks this isn't C. difficile because usually when she gets C. difficile she has to go 10 or 12 times a day. She's not had any fever but she's been feeling cold and chilled.   Past Medical History:  Diagnosis Date  . Abdominal pain   . Allergy   . Cancer [facility]) 2017   breast cancer- Left  . Cataract   . CHF (congestive heart failure) ([place])   . Collagenous colitis   . Diabetes mellitus without complication ([place])   . Diarrhea   . Diverticulosis   . Fibrocystic breast   . GERD (gastroesophageal reflux disease)   . Heart murmur   . Hyperlipidemia   . Hypertension   . Hypothyroidism   . IBS (irritable bowel syndrome)   . IBS (irritable bowel syndrome)   . IDA (iron deficiency anemia)   . Personal history of radiation therapy   . PONV (postoperative nausea and vomiting)   . Sleep apnea    C-Pap    Patient Active Problem List   Diagnosis Date Noted  . Other pancytopenia ([place]) 11/14/2016  . 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 Status post hysterectomy 06/20/2016  . 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 Vaginal  atrophy 01/12/2016  . 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 ABDOMINAL HYSTERECTOMY     [place]  . ABDOMINAL SURGERY    . APPENDECTOMY    . BREAST BIOPSY Bilateral    negative  . 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 BREAST BIOPSY Left 05/27/2016   neg  . CARDIAC SURGERY    . CATARACT EXTRACTION Right   . CHOLECYSTECTOMY    . COLONOSCOPY WITH PROPOFOL N/A 03/11/2016   Procedure: COLONOSCOPY WITH PROPOFOL;  Surgeon: Manya Silvas, MD;  Location: [facility] ENDOSCOPY;  Service: Endoscopy;  Laterality: N/A;  . ESOPHAGOGASTRODUODENOSCOPY (EGD) WITH PROPOFOL N/A 03/11/2016   Procedure: ESOPHAGOGASTRODUODENOSCOPY (EGD) WITH PROPOFOL;  Surgeon: Manya Silvas, MD;  Location: [facility] ENDOSCOPY;  Service: Endoscopy;  Laterality: N/A;  . JOINT REPLACEMENT Left    TKR  . left sinusplasty     . MASTECTOMY, PARTIAL Left 10/17/2015   Procedure: MASTECTOMY PARTIAL REVISION;  Surgeon: Leonie Green, MD;  Location: [facility] ORS;  Service: General;  Laterality: Left;  . PARTIAL MASTECTOMY WITH NEEDLE LOCALIZATION Left 09/29/2015   Procedure: PARTIAL MASTECTOMY WITH NEEDLE LOCALIZATION;  Surgeon: Leonie Green, MD;  Location: [facility] ORS;  Service: General;  Laterality: Left;  . TOTAL ABDOMINAL HYSTERECTOMY W/ BILATERAL SALPINGOOPHORECTOMY  Prior to Admission medications   Medication Sig Start Date End Date Taking? Authorizing Provider  aspirin 81 MG tablet Take 81 mg by mouth at bedtime.   Yes [provider]  budesonide-formoterol (SYMBICORT) 80-4.5 MCG/ACT inhaler Inhale 2 puffs into the lungs 2 (two) times daily as needed (shortness of breath).    Yes [provider]  cholecalciferol (VITAMIN D) 1000 units tablet Take 2,000 Units by mouth daily.    Yes [provider]  Cyanocobalamin (B-12 COMPLIANCE INJECTION) 1000 MCG/ML KIT Inject 1 Syringe as directed every  30 (thirty) days.   Yes [provider]  escitalopram (LEXAPRO) 5 MG tablet Take 5 mg by mouth at bedtime.   Yes [provider]  esomeprazole (NEXIUM) 40 MG capsule Take 40 mg by mouth daily at 12 noon.  02/08/16  Yes [provider]  furosemide (LASIX) 20 MG tablet Take 20-40 mg by mouth every other day. Alternate between 1 a day and 2 a day   Yes [provider]  glimepiride (AMARYL) 2 MG tablet Take 2 mg by mouth at bedtime.   Yes [provider]  hydrOXYzine (ATARAX/VISTARIL) 25 MG tablet 1 or 2 tablets at night for itching and sleep 10/31/16  Yes Defrancesco, Alanda Slim, MD  Hyoscyamine Sulfate 0.375 MG TBCR Take 0.375 mg by mouth 2 (two) times daily.    Yes [provider]  Insulin Degludec (TRESIBA FLEXTOUCH [place]) Inject 10 Units into the skin at bedtime.   Yes [provider]  lansoprazole (PREVACID) 30 MG capsule Take 30 mg by mouth daily as needed (reflux).    Yes [provider]  letrozole (FEMARA) 2.5 MG tablet Take 1 tablet (2.5 mg total) daily by mouth. Once a day. 12/17/16  Yes Cammie Sickle, MD  levothyroxine (SYNTHROID, LEVOTHROID) 75 MCG tablet Take 75 mcg by mouth daily before breakfast.    Yes [provider]  magnesium oxide (MAG-OX) 400 MG tablet Take by mouth.   Yes [provider]  metFORMIN (GLUCOPHAGE-XR) 750 MG 24 hr tablet Take 750 mg by mouth 2 (two) times daily.    Yes [provider]  mirabegron ER (MYRBETRIQ) 50 MG TB24 tablet Take 1 tablet (50 mg total) by mouth daily. 08/19/16  Yes Ardis Hughs, MD  potassium chloride SA (K-DUR,KLOR-CON) 20 MEQ tablet Take by mouth.   Yes [provider]  saccharomyces boulardii (FLORASTOR) 250 MG capsule Take 250 mg by mouth 2 (two) times daily.    Yes [provider]  simvastatin (ZOCOR) 20 MG tablet Take 20 mg by mouth at bedtime.   Yes [provider]  valACYclovir (VALTREX) 500 MG tablet Take 500  mg by mouth 2 (two) times daily. 01/06/17  Yes [provider]  valsartan (DIOVAN) 160 MG tablet Take 160 mg by mouth every morning.    Yes [provider]  fenofibrate 160 MG tablet Take 160 mg by mouth at bedtime.    [provider]  fexofenadine (ALLEGRA) 180 MG tablet Take 180 mg by mouth at bedtime.     [provider]  fluticasone (FLONASE) 50 MCG/ACT nasal spray Place 1 spray into the nose daily as needed for allergies.     [provider]  gabapentin (NEURONTIN) 300 MG capsule Take 300 mg by mouth daily. 09/07/16 10/25/36  [provider]  MISC NATURAL PRODUCTS PO Take by mouth.    [provider]  montelukast (SINGULAIR) 10 MG tablet Take 10 mg by mouth at  bedtime.     [provider]  nitrofurantoin, macrocrystal-monohydrate, (MACROBID) 100 MG capsule Take 1 capsule (100 mg total) 2 (two) times daily by mouth. Patient not taking: Reported on 01/10/2017 12/30/16   Defrancesco, Alanda Slim, MD  nystatin-triamcinolone (MYCOLOG II) cream Apply 1 application topically 2 (two) times daily. 11/05/16   Defrancesco, Alanda Slim, MD  olopatadine (PATANOL) 0.1 % ophthalmic solution 1 drop 2 (two) times daily.    [provider]  ondansetron (ZOFRAN) 4 MG tablet Take 4 mg by mouth every 6 (six) hours as needed. for nausea 01/06/17   [provider]  sulfamethoxazole-trimethoprim (BACTRIM DS,SEPTRA DS) 800-160 MG tablet Take 1 tablet 2 (two) times daily by mouth. Patient not taking: Reported on 01/10/2017 12/26/16   Defrancesco, Alanda Slim, MD    Allergies Fentanyl; Codeine; Demeclocycline; Demerol [meperidine]; Hydrocodone-acetaminophen; Other; Oxycodone; Pentazocine; and Tetracyclines & related  Family History  Problem Relation Age of Onset  . Breast cancer Paternal Grandmother   . Colon cancer Father   . Diabetes Sister   . Diabetes Brother   . Heart disease Brother   . Prostate cancer Brother   . Colon cancer  Maternal Uncle   . Prostate cancer Brother   . Bladder Cancer Brother   . Ovarian cancer Neg Hx   . Kidney cancer Neg Hx     Social History Social History   Tobacco Use  . Smoking status: Never Smoker  . Smokeless tobacco: Never Used  Substance Use Topics  . Alcohol use: No    Alcohol/week: 0.0 oz  . Drug use: No    Review of Systems  Constitutional: No fever/chills Eyes: No visual changes. ENT: No sore throat. Cardiovascular: Denies chest pain. Respiratory: Denies shortness of breath. Gastrointestinal: see history of present illness Genitourinary: Negative for dysuria. Musculoskeletal: Negative for back pain. Skin: Negative for rash. Neurological: Negative for headaches, focal weakness   ____________________________________________   PHYSICAL EXAM:  VITAL SIGNS: ED Triage Vitals [01/10/17 1344]  Enc Vitals Group     BP (!) 110/45     Pulse Rate (!) 59     Resp 18     Temp 98.4 F (36.9 C)     Temp Source Oral     SpO2 96 %     Weight 164 lb (74.4 kg)     Height _0  (1.6 m)     Head Circumference      Peak Flow      Pain Score 0     Pain Loc      Pain Edu?      Excl. in [place]?     Constitutional: Alert and oriented. Well appearing and in no acute distress. Eyes: Conjunctivae are normal.  Head: Atraumatic. Nose: No congestion/rhinnorhea. Mouth/Throat: Mucous membranes are moist.  Oropharynx non-erythematous. Neck: No stridor. Cardiovascular: Normal rate, regular rhythm. Grossly normal heart sounds.  Good peripheral circulation. Respiratory: Normal respiratory effort.  No retractions. Lungs CTAB. Gastrointestinal: Soft slightly distended diffusely tender worse in the left lower quadrant. No abdominal bruits. No CVA tenderness. Musculoskeletal: No lower extremity tenderness nor edema.  No joint effusions. Neurologic:  Normal speech and language. No gross focal neurologic deficits are appreciated. No gait instability. Skin:  Skin is warm, dry and intact.  No rash noted. Psychiatric: Mood and affect are normal. Speech and behavior are normal.  ____________________________________________   LABS (all labs ordered are listed, but only abnormal results are displayed)  Labs Reviewed  COMPREHENSIVE METABOLIC PANEL - Abnormal; Notable for  the following components:      Result Value   Sodium 134 (*)    Potassium 3.4 (*)    Chloride 98 (*)    Glucose, Bld 150 (*)    Calcium 8.5 (*)    Albumin 3.0 (*)    AST 47 (*)    All other components within normal limits  CBC - Abnormal; Notable for the following components:   RBC 3.59 (*)    Hemoglobin 10.3 (*)    HCT 30.9 (*)    RDW 15.2 (*)    Platelets 127 (*)    All other components within normal limits  GASTROINTESTINAL PANEL BY PCR, STOOL (REPLACES STOOL CULTURE)  C DIFFICILE QUICK SCREEN W PCR REFLEX  LIPASE, BLOOD  URINALYSIS, COMPLETE (UACMP) WITH MICROSCOPIC   ____________________________________________  EKG   ____________________________________________  RADIOLOGY CT report as noted below. Patient continues to have decreased diffuse abdominal pain. This could be due to her lymph adenopathy although she does have some ascites. New mild ascites and diffuse mesenteric edema. No evidence of focal inflammatory process or abscess.  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  New tiny right pleural effusion, and bibasilar interstitial prominence suspicious for pulmonary interstitial edema. ____________________________________________   PROCEDURES  Procedure(s) performed:   Procedures  Critical Care performed:   ____________________________________________   INITIAL IMPRESSION / ASSESSMENT AND PLAN / ED  COURSE  patient continues to look very weak and pale and remained nauseated even after 2 L of fluid. She has not had vomiting or diarrhea but is old and looks very ill. I believe we should admit her. It may be useful to try to tap the fluid to see if there is any infection there. However the amount appears to be so small it would probably be almost impossible.      ____________________________________________   FINAL CLINICAL IMPRESSION(S) / ED DIAGNOSES  Final diagnoses:  Dehydration  Weak  Abdominal pain, unspecified abdominal location     ED Discharge Orders    None       Note:  This document was prepared using Dragon voice recognition software and may include unintentional dictation errors.    Nena Polio, MD 01/11/17 [id number]

## 2017-01-11 DIAGNOSIS — E039 Hypothyroidism, unspecified: Secondary | ICD-10-CM | POA: Diagnosis present

## 2017-01-11 DIAGNOSIS — K219 Gastro-esophageal reflux disease without esophagitis: Secondary | ICD-10-CM | POA: Diagnosis present

## 2017-01-11 DIAGNOSIS — R197 Diarrhea, unspecified: Secondary | ICD-10-CM

## 2017-01-11 DIAGNOSIS — I10 Essential (primary) hypertension: Secondary | ICD-10-CM | POA: Diagnosis present

## 2017-01-11 DIAGNOSIS — K52831 Collagenous colitis: Secondary | ICD-10-CM | POA: Diagnosis not present

## 2017-01-11 DIAGNOSIS — E119 Type 2 diabetes mellitus without complications: Secondary | ICD-10-CM

## 2017-01-11 DIAGNOSIS — R112 Nausea with vomiting, unspecified: Secondary | ICD-10-CM | POA: Diagnosis present

## 2017-01-11 LAB — GASTROINTESTINAL PANEL BY PCR, STOOL (REPLACES STOOL CULTURE)

## 2017-01-11 LAB — URINALYSIS, COMPLETE (UACMP) WITH MICROSCOPIC
Bilirubin Urine: NEGATIVE
Glucose, UA: NEGATIVE mg/dL
Hgb urine dipstick: NEGATIVE
Ketones, ur: NEGATIVE mg/dL
Nitrite: NEGATIVE
Protein, ur: NEGATIVE mg/dL
Specific Gravity, Urine: >1.046 — ABNORMAL HIGH (ref 1.005–1.030)
pH: 5 (ref 5.0–8.0)

## 2017-01-11 LAB — BASIC METABOLIC PANEL
Anion gap: 9 (ref 5–15)
BUN: 9 mg/dL (ref 6–20)
CO2: 24 mmol/L (ref 22–32)
Calcium: 8.1 mg/dL — ABNORMAL LOW (ref 8.9–10.3)
Chloride: 105 mmol/L (ref 101–111)
Creatinine, Ser: 0.56 mg/dL (ref 0.44–1.00)
GFR calc Af Amer: >60 mL/min (ref >60)
GFR calc non Af Amer: >60 mL/min (ref >60)
Glucose, Bld: 209 mg/dL — ABNORMAL HIGH (ref 65–99)
Potassium: 3 mmol/L — ABNORMAL LOW (ref 3.5–5.1)
Sodium: 138 mmol/L (ref 135–145)

## 2017-01-11 LAB — CBC
HCT: 30.6 % — ABNORMAL LOW (ref 35.0–47.0)
Hemoglobin: 10.1 g/dL — ABNORMAL LOW (ref 12.0–16.0)
MCH: 28.6 pg (ref 26.0–34.0)
MCHC: 33.1 g/dL (ref 32.0–36.0)
MCV: 86.5 fL (ref 80.0–100.0)
Platelets: 111 10*3/uL — ABNORMAL LOW (ref 150–440)
RBC: 3.54 MIL/uL — ABNORMAL LOW (ref 3.80–5.20)
RDW: 15.5 % — ABNORMAL HIGH (ref 11.5–14.5)
WBC: 2.3 10*3/uL — ABNORMAL LOW (ref 3.6–11.0)

## 2017-01-11 LAB — C DIFFICILE QUICK SCREEN W PCR REFLEX
C Diff antigen: NEGATIVE
C Diff interpretation: NOT DETECTED
C Diff toxin: NEGATIVE

## 2017-01-11 LAB — GLUCOSE, CAPILLARY
Glucose-Capillary: 125 mg/dL — ABNORMAL HIGH (ref 65–99)
Glucose-Capillary: 167 mg/dL — ABNORMAL HIGH (ref 65–99)
Glucose-Capillary: 215 mg/dL — ABNORMAL HIGH (ref 65–99)

## 2017-01-11 MED ORDER — ONDANSETRON HCL 4 MG PO TABS: 4.0000 mg | ORAL_TABLET | Freq: Four times a day (QID) | ORAL | Status: DC | PRN

## 2017-01-11 MED ORDER — ESCITALOPRAM OXALATE 10 MG PO TABS
5.0000 mg | ORAL_TABLET | Freq: Every day | ORAL | Status: DC
  Administered 2017-01-11: 5 mg via ORAL
  Filled 2017-01-11 (×2): qty 0.5

## 2017-01-11 MED ORDER — ONDANSETRON HCL 4 MG/2ML IJ SOLN
4.0000 mg | Freq: Four times a day (QID) | INTRAMUSCULAR | Status: DC | PRN
  Administered 2017-01-11: 4 mg via INTRAVENOUS
  Filled 2017-01-11: qty 2

## 2017-01-11 MED ORDER — FUROSEMIDE 40 MG PO TABS
40.0000 mg | ORAL_TABLET | Freq: Every day | ORAL | Status: DC
  Administered 2017-01-11 – 2017-01-12 (×2): 40 mg via ORAL
  Filled 2017-01-11 (×2): qty 1

## 2017-01-11 MED ORDER — SPIRONOLACTONE 25 MG PO TABS
25.0000 mg | ORAL_TABLET | Freq: Every day | ORAL | Status: DC
  Administered 2017-01-11 – 2017-01-12 (×2): 25 mg via ORAL
  Filled 2017-01-11 (×2): qty 1

## 2017-01-11 MED ORDER — ASPIRIN 81 MG PO CHEW
81.0000 mg | CHEWABLE_TABLET | Freq: Every day | ORAL | Status: DC
  Administered 2017-01-11: 81 mg via ORAL
  Filled 2017-01-11: qty 1

## 2017-01-11 MED ORDER — METOCLOPRAMIDE HCL 5 MG/ML IJ SOLN
5.0000 mg | Freq: Four times a day (QID) | INTRAMUSCULAR | Status: DC
  Administered 2017-01-11 – 2017-01-12 (×5): 5 mg via INTRAVENOUS
  Filled 2017-01-11 (×5): qty 2

## 2017-01-11 MED ORDER — MOMETASONE FURO-FORMOTEROL FUM 100-5 MCG/ACT IN AERO
2.0000 | INHALATION_SPRAY | Freq: Two times a day (BID) | RESPIRATORY_TRACT | Status: DC
  Administered 2017-01-11 – 2017-01-12 (×3): 2 via RESPIRATORY_TRACT
  Filled 2017-01-11: qty 8.8

## 2017-01-11 MED ORDER — SIMVASTATIN 20 MG PO TABS
20.0000 mg | ORAL_TABLET | Freq: Every day | ORAL | Status: DC
  Administered 2017-01-11: 20 mg via ORAL
  Filled 2017-01-11: qty 1

## 2017-01-11 MED ORDER — METOCLOPRAMIDE HCL 5 MG/ML IJ SOLN
5.0000 mg | Freq: Three times a day (TID) | INTRAMUSCULAR | Status: DC | PRN
  Administered 2017-01-11: 5 mg via INTRAVENOUS
  Filled 2017-01-11: qty 2

## 2017-01-11 MED ORDER — LEVOTHYROXINE SODIUM 50 MCG PO TABS
75.0000 ug | ORAL_TABLET | Freq: Every day | ORAL | Status: DC
  Administered 2017-01-11 – 2017-01-12 (×2): 75 ug via ORAL
  Filled 2017-01-11 (×2): qty 1

## 2017-01-11 MED ORDER — VALACYCLOVIR HCL 500 MG PO TABS
500.0000 mg | ORAL_TABLET | Freq: Two times a day (BID) | ORAL | Status: DC
  Administered 2017-01-11 – 2017-01-12 (×4): 500 mg via ORAL
  Filled 2017-01-11 (×5): qty 1

## 2017-01-11 MED ORDER — PANTOPRAZOLE SODIUM 40 MG PO TBEC
40.0000 mg | DELAYED_RELEASE_TABLET | Freq: Two times a day (BID) | ORAL | Status: DC
  Administered 2017-01-11 – 2017-01-12 (×4): 40 mg via ORAL
  Filled 2017-01-11 (×4): qty 1

## 2017-01-11 MED ORDER — HYOSCYAMINE SULFATE ER 0.375 MG PO TB12
0.3750 mg | ORAL_TABLET | Freq: Two times a day (BID) | ORAL | Status: DC
  Administered 2017-01-11 – 2017-01-12 (×4): 0.375 mg via ORAL
  Filled 2017-01-11 (×6): qty 1

## 2017-01-11 MED ORDER — ACETAMINOPHEN 650 MG RE SUPP: 650.0000 mg | Freq: Four times a day (QID) | RECTAL | Status: DC | PRN

## 2017-01-11 MED ORDER — IRBESARTAN 150 MG PO TABS
150.0000 mg | ORAL_TABLET | Freq: Every day | ORAL | Status: DC
  Administered 2017-01-11 – 2017-01-12 (×2): 150 mg via ORAL
  Filled 2017-01-11 (×2): qty 1

## 2017-01-11 MED ORDER — DEXTROSE 5 % IV SOLN
1.0000 g | Freq: Every day | INTRAVENOUS | Status: DC
  Administered 2017-01-11 – 2017-01-12 (×2): 1 g via INTRAVENOUS
  Filled 2017-01-11 (×2): qty 10

## 2017-01-11 MED ORDER — INSULIN ASPART 100 UNIT/ML ~~LOC~~ SOLN
0.0000 [IU] | Freq: Four times a day (QID) | SUBCUTANEOUS | Status: DC
  Administered 2017-01-11: 2 [IU] via SUBCUTANEOUS
  Administered 2017-01-11: 1 [IU] via SUBCUTANEOUS
  Administered 2017-01-11: 3 [IU] via SUBCUTANEOUS
  Administered 2017-01-12: 2 [IU] via SUBCUTANEOUS
  Administered 2017-01-12 (×2): 0 [IU] via SUBCUTANEOUS
  Filled 2017-01-11 (×4): qty 1

## 2017-01-11 MED ORDER — SODIUM CHLORIDE 0.9 % IV SOLN
INTRAVENOUS | Status: DC
  Administered 2017-01-11: 03:00:00 via INTRAVENOUS

## 2017-01-11 MED ORDER — POTASSIUM CHLORIDE CRYS ER 20 MEQ PO TBCR
20.0000 meq | EXTENDED_RELEASE_TABLET | Freq: Every day | ORAL | Status: AC
  Administered 2017-01-11 – 2017-01-12 (×2): 20 meq via ORAL
  Filled 2017-01-11 (×2): qty 1

## 2017-01-11 MED ORDER — ACETAMINOPHEN 325 MG PO TABS: 650.0000 mg | ORAL_TABLET | Freq: Four times a day (QID) | ORAL | Status: DC | PRN

## 2017-01-11 MED ORDER — MONTELUKAST SODIUM 10 MG PO TABS
10.0000 mg | ORAL_TABLET | Freq: Every day | ORAL | Status: DC
  Administered 2017-01-11: 10 mg via ORAL
  Filled 2017-01-11: qty 1

## 2017-01-11 MED ORDER — GABAPENTIN 300 MG PO CAPS
300.0000 mg | ORAL_CAPSULE | Freq: Every day | ORAL | Status: DC
Start: 2017-01-11 — End: 2017-01-12
  Administered 2017-01-11: 300 mg via ORAL
  Filled 2017-01-11 (×2): qty 1

## 2017-01-11 MED ORDER — ENOXAPARIN SODIUM 40 MG/0.4ML ~~LOC~~ SOLN
40.0000 mg | SUBCUTANEOUS | Status: DC
  Administered 2017-01-11: 40 mg via SUBCUTANEOUS
  Filled 2017-01-11: qty 0.4

## 2017-01-11 NOTE — Progress Notes (Signed)
Patient had a loose stool this evening. Spoke to MD Jannifer Franklin who already had GI panel ordered. Spoke to [place] in lab who informed that there was enough of the patient's sample to run a GI panel. Enteric precautions maintained. Will continue to monitor and endorse.

## 2017-01-11 NOTE — ED Notes (Signed)
Additional warm blankets provided

## 2017-01-11 NOTE — H&P (Signed)
[place] [facility] [place] NAME: Annette Foster    MR#:  [id number]  DATE OF BIRTH:  1940/04/17  DATE OF ADMISSION:  01/10/2017  PRIMARY CARE PHYSICIAN: Albina Billet, MD   REQUESTING/REFERRING PHYSICIAN: Cinda Quest, MD  CHIEF COMPLAINT:   Chief Complaint  Patient presents with  . Emesis  . Diarrhea    HISTORY OF PRESENT ILLNESS:  Annette Foster  is a 76 y.o. female who presents with diarrhea, nausea and vomiting.  Patient states this is been going on and is significantly worse way for a couple of days.  She states she normally has some amount of diarrhea due to collagenous colitis, but her symptoms have been much worse especially the nausea and vomiting for the past several days.  Workup here shows only some dehydration with no clear etiology for her worsening symptoms.  Hospitals were called for admission  PAST MEDICAL HISTORY:   Past Medical History:  Diagnosis Date  . Abdominal pain   . Allergy   . Cancer [facility]) 2017   breast cancer- Left  . Cataract   . CHF (congestive heart failure) ([place])   . Collagenous colitis   . Diabetes mellitus without complication ([place])   . Diarrhea   . Diverticulosis   . Fibrocystic breast   . GERD (gastroesophageal reflux disease)   . Heart murmur   . Hyperlipidemia   . Hypertension   . Hypothyroidism   . IBS (irritable bowel syndrome)   . IBS (irritable bowel syndrome)   . IDA (iron deficiency anemia)   . Personal history of radiation therapy   . PONV (postoperative nausea and vomiting)   . Sleep apnea    C-Pap    PAST SURGICAL HISTORY:   Past Surgical History:  Procedure Laterality Date  . ABDOMINAL HYSTERECTOMY     tah bso  . ABDOMINAL SURGERY    . APPENDECTOMY    . BREAST BIOPSY Bilateral    negative  . 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 BREAST BIOPSY Left 05/27/2016   neg  . CARDIAC SURGERY    . CATARACT EXTRACTION Right   . CHOLECYSTECTOMY     . COLONOSCOPY WITH PROPOFOL N/A 03/11/2016   Procedure: COLONOSCOPY WITH PROPOFOL;  Surgeon: Manya Silvas, MD;  Location: [facility] ENDOSCOPY;  Service: Endoscopy;  Laterality: N/A;  . ESOPHAGOGASTRODUODENOSCOPY (EGD) WITH PROPOFOL N/A 03/11/2016   Procedure: ESOPHAGOGASTRODUODENOSCOPY (EGD) WITH PROPOFOL;  Surgeon: Manya Silvas, MD;  Location: [facility] ENDOSCOPY;  Service: Endoscopy;  Laterality: N/A;  . JOINT REPLACEMENT Left    TKR  . left sinusplasty     . MASTECTOMY, PARTIAL Left 10/17/2015   Procedure: MASTECTOMY PARTIAL REVISION;  Surgeon: Leonie Green, MD;  Location: [facility] ORS;  Service: General;  Laterality: Left;  . PARTIAL MASTECTOMY WITH NEEDLE LOCALIZATION Left 09/29/2015   Procedure: PARTIAL MASTECTOMY WITH NEEDLE LOCALIZATION;  Surgeon: Leonie Green, MD;  Location: [facility] ORS;  Service: General;  Laterality: Left;  . TOTAL ABDOMINAL HYSTERECTOMY W/ BILATERAL SALPINGOOPHORECTOMY      SOCIAL HISTORY:   Social History   Tobacco Use  . Smoking status: Never Smoker  . Smokeless tobacco: Never Used  Substance Use Topics  . Alcohol use: No    Alcohol/week: 0.0 oz    FAMILY HISTORY:   Family History  Problem Relation Age of Onset  . Breast cancer Paternal Grandmother   . Colon cancer Father   . Diabetes Sister   .  Diabetes Brother   . Heart disease Brother   . Prostate cancer Brother   . Colon cancer Maternal Uncle   . Prostate cancer Brother   . Bladder Cancer Brother   . Ovarian cancer Neg Hx   . Kidney cancer Neg Hx     DRUG ALLERGIES:   Allergies  Allergen Reactions  . Fentanyl Shortness Of Breath, Itching and Nausea And Vomiting  . Codeine Itching and Nausea And Vomiting  . Demeclocycline Other (See Comments) and Rash  . Demerol [Meperidine] Itching and Nausea And Vomiting  . Hydrocodone-Acetaminophen Itching and Nausea And Vomiting  . Other Other (See Comments)  . Oxycodone Itching and Nausea And Vomiting  . Pentazocine Other (See Comments)   . Tetracyclines & Related Rash    MEDICATIONS AT HOME:   Prior to Admission medications   Medication Sig Start Date End Date Taking? Authorizing Provider  aspirin 81 MG tablet Take 81 mg by mouth at bedtime.   Yes [provider]  budesonide-formoterol (SYMBICORT) 80-4.5 MCG/ACT inhaler Inhale 2 puffs into the lungs 2 (two) times daily as needed (shortness of breath).    Yes [provider]  cholecalciferol (VITAMIN D) 1000 units tablet Take 2,000 Units by mouth daily.    Yes [provider]  Cyanocobalamin (B-12 COMPLIANCE INJECTION) 1000 MCG/ML KIT Inject 1 Syringe as directed every 30 (thirty) days.   Yes [provider]  escitalopram (LEXAPRO) 5 MG tablet Take 5 mg by mouth at bedtime.   Yes [provider]  esomeprazole (NEXIUM) 40 MG capsule Take 40 mg by mouth daily at 12 noon.  02/08/16  Yes [provider]  furosemide (LASIX) 20 MG tablet Take 20-40 mg by mouth every other day. Alternate between 1 a day and 2 a day   Yes [provider]  glimepiride (AMARYL) 2 MG tablet Take 2 mg by mouth at bedtime.   Yes [provider]  hydrOXYzine (ATARAX/VISTARIL) 25 MG tablet 1 or 2 tablets at night for itching and sleep 10/31/16  Yes Defrancesco, Alanda Slim, MD  Hyoscyamine Sulfate 0.375 MG TBCR Take 0.375 mg by mouth 2 (two) times daily.    Yes [provider]  Insulin Degludec (TRESIBA FLEXTOUCH [place]) Inject 10 Units into the skin at bedtime.   Yes [provider]  lansoprazole (PREVACID) 30 MG capsule Take 30 mg by mouth daily as needed (reflux).    Yes [provider]  letrozole (FEMARA) 2.5 MG tablet Take 1 tablet (2.5 mg total) daily by mouth. Once a day. 12/17/16  Yes Cammie Sickle, MD  levothyroxine (SYNTHROID, LEVOTHROID) 75 MCG tablet Take 75 mcg by mouth daily before breakfast.    Yes [provider]  magnesium oxide (MAG-OX) 400 MG tablet Take by mouth.   Yes [provider]  metFORMIN (GLUCOPHAGE-XR) 750 MG 24 hr tablet Take 750 mg by mouth 2 (two) times daily.    Yes [provider]  mirabegron ER (MYRBETRIQ) 50 MG TB24 tablet Take 1 tablet (50 mg total) by mouth daily. 08/19/16  Yes Ardis Hughs, MD  potassium chloride SA (K-DUR,KLOR-CON) 20 MEQ tablet Take by mouth.   Yes [provider]  saccharomyces boulardii (FLORASTOR) 250 MG capsule Take 250 mg by mouth 2 (two) times daily.    Yes [provider]  simvastatin (ZOCOR) 20 MG tablet Take 20 mg by mouth at bedtime.   Yes [provider]  valACYclovir (VALTREX) 500 MG tablet Take 500 mg by mouth  2 (two) times daily. 01/06/17  Yes [provider]  valsartan (DIOVAN) 160 MG tablet Take 160 mg by mouth every morning.    Yes [provider]  fenofibrate 160 MG tablet Take 160 mg by mouth at bedtime.    [provider]  fexofenadine (ALLEGRA) 180 MG tablet Take 180 mg by mouth at bedtime.     [provider]  fluticasone (FLONASE) 50 MCG/ACT nasal spray Place 1 spray into the nose daily as needed for allergies.     [provider]  gabapentin (NEURONTIN) 300 MG capsule Take 300 mg by mouth daily. 09/07/16 10/25/36  [provider]  MISC NATURAL PRODUCTS PO Take by mouth.    [provider]  montelukast (SINGULAIR) 10 MG tablet Take 10 mg by mouth at bedtime.     [provider]  nitrofurantoin, macrocrystal-monohydrate, (MACROBID) 100 MG capsule Take 1 capsule (100 mg total) 2 (two) times daily by mouth. Patient not taking: Reported on 01/10/2017 12/30/16   Defrancesco, Alanda Slim, MD  nystatin-triamcinolone (MYCOLOG II) cream Apply 1 application topically 2 (two) times daily. 11/05/16   Defrancesco, Alanda Slim, MD  olopatadine (PATANOL) 0.1 % ophthalmic solution 1 drop 2 (two) times daily.    [provider]  ondansetron (ZOFRAN) 4 MG tablet Take 4 mg by mouth every 6 (six) hours as needed.  for nausea 01/06/17   [provider]  sulfamethoxazole-trimethoprim (BACTRIM DS,SEPTRA DS) 800-160 MG tablet Take 1 tablet 2 (two) times daily by mouth. Patient not taking: Reported on 01/10/2017 12/26/16   Defrancesco, Alanda Slim, MD    REVIEW OF SYSTEMS:  Review of Systems  Constitutional: Negative for chills, fever, malaise/fatigue and weight loss.  HENT: Negative for ear pain, hearing loss and tinnitus.   Eyes: Negative for blurred vision, double vision, pain and redness.  Respiratory: Negative for cough, hemoptysis and shortness of breath.   Cardiovascular: Negative for chest pain, palpitations, orthopnea and leg swelling.  Gastrointestinal: Positive for diarrhea, nausea and vomiting. Negative for abdominal pain and constipation.  Genitourinary: Negative for dysuria, frequency and hematuria.  Musculoskeletal: Negative for back pain, joint pain and neck pain.  Skin:       No acne, rash, or lesions  Neurological: Negative for dizziness, tremors, focal weakness and weakness.  Endo/Heme/Allergies: Negative for polydipsia. Does not bruise/bleed easily.  Psychiatric/Behavioral: Negative for depression. The patient is not nervous/anxious and does not have insomnia.      VITAL SIGNS:   Vitals:   01/10/17 1930 01/10/17 1945 01/10/17 2030 01/10/17 2330  BP: 117/61   (!) 114/92  Pulse: 61 (!) 59 68   Resp:   14   Temp:      TempSrc:      SpO2: 91% 92% 96%   Weight:      Height:       Wt Readings from Last 3 Encounters:  01/10/17 74.4 kg (164 lb)  12/26/16 79.1 kg (174 lb 4.8 oz)  12/25/16 79.8 kg (175 lb 13.1 oz)    PHYSICAL EXAMINATION:  Physical Exam  Vitals reviewed. Constitutional: She is oriented to person, place, and time. She appears well-developed and well-nourished. No distress.  HENT:  Head: Normocephalic and atraumatic.  Dry mucous membranes  Eyes: Conjunctivae and EOM are normal. Pupils are equal, round, and reactive to light. No scleral icterus.  Neck:  Normal range of motion. Neck supple. No JVD present. No thyromegaly present.  Cardiovascular: Normal rate, regular rhythm and intact distal pulses. Exam reveals no  gallop and no friction rub.  No murmur heard. Respiratory: Effort normal and breath sounds normal. No respiratory distress. She has no wheezes. She has no rales.  GI: Soft. Bowel sounds are normal. She exhibits no distension. There is no tenderness.  Musculoskeletal: Normal range of motion. She exhibits no edema.  No arthritis, no gout  Lymphadenopathy:    She has no cervical adenopathy.  Neurological: She is alert and oriented to person, place, and time. No cranial nerve deficit.  No dysarthria, no aphasia  Skin: Skin is warm and dry. No rash noted. No erythema.  Psychiatric: She has a normal mood and affect. Her behavior is normal. Judgment and thought content normal.    LABORATORY PANEL:   CBC Recent Labs  Lab 01/10/17 1347  WBC 5.7  HGB 10.3*  HCT 30.9*  PLT 127*   ------------------------------------------------------------------------------------------------------------------  Chemistries  Recent Labs  Lab 01/10/17 1347  NA 134*  K 3.4*  CL 98*  CO2 26  GLUCOSE 150*  BUN 17  CREATININE 0.67  CALCIUM 8.5*  AST 47*  ALT 28  ALKPHOS 75  BILITOT 0.7   ------------------------------------------------------------------------------------------------------------------  Cardiac Enzymes No results for input(s): TROPONINI in the last 168 hours. ------------------------------------------------------------------------------------------------------------------  RADIOLOGY:  Ct Abdomen Pelvis W Contrast  Result Date: 01/10/2017 CLINICAL DATA:  Abdominal distention, vomiting, and diarrhea for 2 weeks. EXAM: CT ABDOMEN AND PELVIS WITH CONTRAST TECHNIQUE: Multidetector CT imaging of the abdomen and pelvis was performed using the standard protocol following bolus administration of intravenous contrast. CONTRAST:   153m ISOVUE-300 IOPAMIDOL (ISOVUE-300) INJECTION 61% COMPARISON:  03/07/2016 FINDINGS: Lower Chest: Tiny right pleural effusion. Interstitial prominence in both lung bases, which may be due to mild interstitial edema. Hepatobiliary: No hepatic masses identified. Mild caudate and left hepatic lobe hypertrophy seen, suspicious for cirrhosis. Minimal perihepatic ascites which is new since previous study. Pancreas:  No mass or inflammatory changes. Spleen: Mild splenomegaly and minimal perisplenic ascites. No splenic masses identified. Adrenals/Urinary Tract: No masses identified. No evidence of hydronephrosis. Unremarkable unopacified urinary bladder. Stomach/Bowel: No evidence of obstruction, focal inflammatory process, or abscess. Vascular/Lymphatic: Increased mild lymphadenopathy in porta hepatis, with largest lymph node measuring 1.6 cm on image 24/2. Sub-cm retroperitoneal and mesenteric lymph nodes are again seen measuring up to 9 mm, without significant change. This is likely reactive in etiology. Aortic atherosclerosis. No evidence of abdominal aortic aneurysm. Reproductive:  Prior hysterectomy.  No adnexal mass identified. Other:  Mild diffuse mesenteric edema. Musculoskeletal:  No suspicious bone lesions identified. IMPRESSION: New mild ascites and diffuse mesenteric edema. No evidence of focal inflammatory process or abscess. 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 New tiny right pleural effusion, and bibasilar interstitial prominence suspicious for pulmonary interstitial edema. Electronically Signed   By: JEarle GellM.D.   On: 01/10/2017 20:48    EKG:   Orders placed or performed during the hospital encounter of 09/26/15  . EKG 12 lead  .  EKG 12 lead    IMPRESSION AND PLAN:  Principal Problem:   Nausea vomiting and diarrhea -IV fluids, C. difficile and GI panel ordered Active Problems:   HTN (hypertension) -continue home meds   Diabetes (HCC) -sliding scale insulin with corresponding glucose checks   Hypothyroidism -home dose thyroid replacement   GERD (gastroesophageal reflux disease) -home dose PPI  All the  records are reviewed and case discussed with ED provider. Management plans discussed with the patient and/or family.  DVT PROPHYLAXIS: Subq lovenox  GI PROPHYLAXIS: PPI  ADMISSION STATUS: Observation  CODE STATUS: Full Code Status History    This patient does not have a recorded code status. Please follow your organizational policy for patients in this situation.    Advance Directive Documentation     Most Recent Value  Type of Advance Directive  Healthcare Power of Attorney, Living will  Pre-existing out of facility DNR order (yellow form or pink MOST form)  No data  "MOST" Form in Place?  No data      TOTAL TIME TAKING CARE OF THIS PATIENT: 40 minutes.   Jannifer Franklin, [place] 01/11/2017, 1:27 AM  [employer]  [phone]  CC: Primary care physician; Albina Billet, MD  Note:  This document was prepared using Dragon voice recognition software and may include unintentional dictation errors.

## 2017-01-11 NOTE — ED Notes (Signed)
Meal tray provided with consent of dr. Owens Shark.

## 2017-01-11 NOTE — ED Notes (Signed)
Po fluids given with dr. Cinda Quest consent.

## 2017-01-11 NOTE — Consult Note (Signed)
Pharmacy Antibiotic Note  Annette Foster is a 76 y.o. female admitted on 01/10/2017 with UTI.  Pharmacy has been consulted for ceftriaxone dosing. Pt has had recurrent UTI. 2 weeks ago urine grew ecoli sensitive to ceftriaxone. Was given oral abx but hasnt been taking due to n/v  Plan: ceftriaxone 1g q 24hr  Height: 5\' 3"  (160 cm) Weight: 173 lb 11.2 oz (78.8 kg) IBW/kg (Calculated) : 52.4  Temp (24hrs), Avg:98.4 F (36.9 C), Min:98.2 F (36.8 C), Max:98.7 F (37.1 C)  Recent Labs  Lab 01/10/17 1347  WBC 5.7  CREATININE 0.67    Estimated Creatinine Clearance: 59.5 mL/min (by C-G formula based on SCr of 0.67 mg/dL).    Allergies  Allergen Reactions  . Fentanyl Shortness Of Breath, Itching and Nausea And Vomiting  . Codeine Itching and Nausea And Vomiting  . Demeclocycline Other (See Comments) and Rash  . Demerol [Meperidine] Itching and Nausea And Vomiting  . Hydrocodone-Acetaminophen Itching and Nausea And Vomiting  . Other Other (See Comments)  . Oxycodone Itching and Nausea And Vomiting  . Pentazocine Other (See Comments)  . Tetracyclines & Related Rash    Antimicrobials this admission: Ceftriaxone 12/1 >>   Dose adjustments this admission:   Microbiology results: Results for orders placed or performed in visit on 12/26/16  Urine Culture     Status: Abnormal   Collection Time: 12/26/16 10:21 AM  Result Value Ref Range Status   Urine Culture, Routine Final report (A)  Final   Organism ID, Bacteria Escherichia coli (A)  Final    Comment: Greater than 100,000 colony forming units per mL Cefazolin <=4 ug/mL Cefazolin with an MIC <=16 predicts susceptibility to the oral agents cefaclor, cefdinir, cefpodoxime, cefprozil, cefuroxime, cephalexin, and loracarbef when used for therapy of uncomplicated urinary tract infections due to E. coli, Klebsiella pneumoniae, and Proteus mirabilis.    Antimicrobial Susceptibility Comment  Final    Comment:       ** S =  Susceptible; I = Intermediate; R = Resistant **                    P = Positive; N = Negative             MICS are expressed in micrograms per mL    Antibiotic                 RSLT#1    RSLT#2    RSLT#3    RSLT#4 Amoxicillin/Clavulanic Acid    I Ampicillin                     R Cefepime                       S Ceftriaxone                    S Cefuroxime                     I Ciprofloxacin                  R Ertapenem                      S Gentamicin                     S Imipenem  S Levofloxacin                   R Meropenem                      S Nitrofurantoin                 S Piperacillin/Tazobactam        S Tetracycline                   R Tobramycin                     S Trimethoprim/Sulfa             R      Thank you for allowing pharmacy to be a part of this patient's care.  Ramond Dial, Pharm.D, BCPS Clinical Pharmacist  01/11/2017 12:53 PM

## 2017-01-11 NOTE — Progress Notes (Signed)
Pt doing well, continues on abt for UTI. No signs or systems of any adverse reaction. Po fluids encourage. Vss, continues on a liquid diet. Tolerated well. Contract precaution discontinued.

## 2017-01-11 NOTE — Progress Notes (Signed)
Admitted pt from ED in stable condition. A+Ox4. Assisted to bathroom. No BM thus far. Denies pain. Pt reporting nausea, PRN reglan given with relief. Admission assessment complete. Bed alarm and nonslip socks on. Fall and safety precautions implemented.

## 2017-01-11 NOTE — Progress Notes (Addendum)
[facility] at [place] NAME: Annette Foster    MR#:  [id number]  DATE OF BIRTH:  1940/05/10  SUBJECTIVE:  CHIEF COMPLAINT:   Chief Complaint  Patient presents with  . Emesis  . Diarrhea   -No further diarrhea. No vomiting. Still feels very nauseous today.  REVIEW OF SYSTEMS:  Review of Systems  Constitutional: Negative for chills and fever.  HENT: Negative for congestion, ear discharge, hearing loss and nosebleeds.   Respiratory: Negative for cough, shortness of breath and wheezing.   Cardiovascular: Negative for chest pain and palpitations.  Gastrointestinal: Positive for diarrhea and nausea. Negative for abdominal pain, constipation and vomiting.  Genitourinary: Negative for dysuria.  Musculoskeletal: Negative for myalgias.  Neurological: Positive for weakness. Negative for dizziness, speech change, focal weakness, seizures and headaches.  Psychiatric/Behavioral: Negative for depression.    DRUG ALLERGIES:   Allergies  Allergen Reactions  . Fentanyl Shortness Of Breath, Itching and Nausea And Vomiting  . Codeine Itching and Nausea And Vomiting  . Demeclocycline Other (See Comments) and Rash  . Demerol [Meperidine] Itching and Nausea And Vomiting  . Hydrocodone-Acetaminophen Itching and Nausea And Vomiting  . Other Other (See Comments)  . Oxycodone Itching and Nausea And Vomiting  . Pentazocine Other (See Comments)  . Tetracyclines & Related Rash    VITALS:  Blood pressure (!) 131/54, pulse (!) 53, temperature 98.2 F (36.8 C), temperature source Oral, resp. rate 18, height 5' 3"  (1.6 m), weight 78.8 kg (173 lb 11.2 oz), SpO2 95 %.  PHYSICAL EXAMINATION:  Physical Exam  GENERAL:  76 y.o.-year-old patient lying in the bed with no acute distress.  EYES: Pupils equal, round, reactive to light and accommodation. No scleral icterus. Extraocular muscles intact.  HEENT: Head atraumatic, normocephalic. Oropharynx and nasopharynx  clear.  NECK:  Supple, no jugular venous distention. No thyroid enlargement, no tenderness.  LUNGS: Normal breath sounds bilaterally, no wheezing, rales,rhonchi or crepitation. No use of accessory muscles of respiration.  CARDIOVASCULAR: S1, S2 normal. No murmurs, rubs, or gallops.  ABDOMEN: Soft, nontender, nondistended. Bowel sounds present. No organomegaly or mass.  EXTREMITIES: No pedal edema, cyanosis, or clubbing.  NEUROLOGIC: Cranial nerves II through XII are intact. Muscle strength 5/5 in all extremities. Sensation intact. Gait not checked.  PSYCHIATRIC: The patient is alert and oriented x 3.  SKIN: No obvious rash, lesion, or ulcer.    LABORATORY PANEL:   CBC Recent Labs  Lab 01/10/17 1347  WBC 5.7  HGB 10.3*  HCT 30.9*  PLT 127*   ------------------------------------------------------------------------------------------------------------------  Chemistries  Recent Labs  Lab 01/10/17 1347  NA 134*  K 3.4*  CL 98*  CO2 26  GLUCOSE 150*  BUN 17  CREATININE 0.67  CALCIUM 8.5*  AST 47*  ALT 28  ALKPHOS 75  BILITOT 0.7   ------------------------------------------------------------------------------------------------------------------  Cardiac Enzymes No results for input(s): TROPONINI in the last 168 hours. ------------------------------------------------------------------------------------------------------------------  RADIOLOGY:  Ct Abdomen Pelvis W Contrast  Result Date: 01/10/2017 CLINICAL DATA:  Abdominal distention, vomiting, and diarrhea for 2 weeks. EXAM: CT ABDOMEN AND PELVIS WITH CONTRAST TECHNIQUE: Multidetector CT imaging of the abdomen and pelvis was performed using the standard protocol following bolus administration of intravenous contrast. CONTRAST:  168m ISOVUE-300 IOPAMIDOL (ISOVUE-300) INJECTION 61% COMPARISON:  03/07/2016 FINDINGS: Lower Chest: Tiny right pleural effusion. Interstitial prominence in both lung bases, which may be due to mild  interstitial edema. Hepatobiliary: No hepatic masses identified. Mild caudate and left hepatic lobe hypertrophy seen,  suspicious for cirrhosis. Minimal perihepatic ascites which is new since previous study. Pancreas:  No mass or inflammatory changes. Spleen: Mild splenomegaly and minimal perisplenic ascites. No splenic masses identified. Adrenals/Urinary Tract: No masses identified. No evidence of hydronephrosis. Unremarkable unopacified urinary bladder. Stomach/Bowel: No evidence of obstruction, focal inflammatory process, or abscess. Vascular/Lymphatic: Increased mild lymphadenopathy in porta hepatis, with largest lymph node measuring 1.6 cm on image 24/2. Sub-cm retroperitoneal and mesenteric lymph nodes are again seen measuring up to 9 mm, without significant change. This is likely reactive in etiology. Aortic atherosclerosis. No evidence of abdominal aortic aneurysm. Reproductive:  Prior hysterectomy.  No adnexal mass identified. Other:  Mild diffuse mesenteric edema. Musculoskeletal:  No suspicious bone lesions identified. IMPRESSION: New mild ascites and diffuse mesenteric edema. No evidence of focal inflammatory process or abscess. 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 New tiny right pleural effusion, and bibasilar interstitial prominence suspicious for pulmonary interstitial edema. Electronically Signed   By: Earle Gell M.D.   On: 01/10/2017 20:48    EKG:   Orders placed or performed during the hospital encounter of 09/26/15  . EKG 12 lead  . EKG 12 lead    ASSESSMENT AND PLAN:   76 year old female with past medical history significant for history of breast cancer in remission, hypertension, IBS, collagenous colitis with chronic  diarrhea, sleep apnea and hypothyroidism presents to hospital secondary to worsening nausea, vomiting and diarrhea.  #1 gastroenteritis-has acute gastroenteritis with nausea/vomiting and diarrhea symptoms -Diarrhea resolved. So will stop and take precautions. No stool was collected since she hasn't had diarrhea since admission. -No further vomitings, still has some nausea. Continue Zofran and Reglan. -CT of the abdomen showing mesenteric edema and some congestion -Start Lasix and Aldactone. Discontinue IV fluids -Started on clear liquids and advance diet as tolerated  #2 neuropathy-continue gabapentin  #3 hypothyroidism-continue Synthroid  #4 hypertension-continue home  medications  #5 DVT prophylaxis- lovenox  #6 hypokalemia-being replaced  #Recurrent UTIs-following with Dr. Erlene Quan as outpatient and is supposed to get cystoscopy at some point. -2 weeks ago urine cultures were growing Escherichia coli. Patient was started on oral antibiotic ED at however due to her persistent nausea and vomiting started 2 weeks ago, she hasn't taken her antibiotics. -Urine still shows bacteria and leukocytes here. We'll start on Rocephin based on the sensitivities. -Follow up urine cultures again   All the records are reviewed and case discussed with Care Management/Social Workerr. Management plans discussed with the patient, family and they are in agreement.  CODE STATUS: Full code  TOTAL TIME TAKING CARE OF THIS PATIENT: 37 minutes.   POSSIBLE D/C IN 1-2 DAYS, DEPENDING ON CLINICAL CONDITION.   Gladstone Lighter M.D on 01/11/2017 at 12:18 PM  Between 7am to 6pm - Pager - [phone]  After 6pm go to [email or web address] - password [email or web address] [facility]  Office  [phone]  CC: Primary care physician; Albina Billet, MD

## 2017-01-11 NOTE — ED Notes (Signed)
Pt sleeping. 

## 2017-01-12 ENCOUNTER — Observation Stay: Payer: Medicare Other

## 2017-01-12 DIAGNOSIS — K52831 Collagenous colitis: Secondary | ICD-10-CM | POA: Diagnosis not present

## 2017-01-12 LAB — BASIC METABOLIC PANEL
Anion gap: 9 (ref 5–15)
BUN: 8 mg/dL (ref 6–20)
CO2: 24 mmol/L (ref 22–32)
Calcium: 8.5 mg/dL — ABNORMAL LOW (ref 8.9–10.3)
Chloride: 104 mmol/L (ref 101–111)
Creatinine, Ser: 0.66 mg/dL (ref 0.44–1.00)
GFR calc Af Amer: >60 mL/min (ref >60)
GFR calc non Af Amer: >60 mL/min (ref >60)
Glucose, Bld: 208 mg/dL — ABNORMAL HIGH (ref 65–99)
Potassium: 3.7 mmol/L (ref 3.5–5.1)
Sodium: 137 mmol/L (ref 135–145)

## 2017-01-12 LAB — GLUCOSE, CAPILLARY
Glucose-Capillary: 104 mg/dL — ABNORMAL HIGH (ref 65–99)
Glucose-Capillary: 139 mg/dL — ABNORMAL HIGH (ref 65–99)
Glucose-Capillary: 197 mg/dL — ABNORMAL HIGH (ref 65–99)

## 2017-01-12 MED ORDER — CEPHALEXIN 250 MG PO CAPS: 250.0000 mg | ORAL_CAPSULE | Freq: Three times a day (TID) | ORAL | 0 refills | Status: AC

## 2017-01-12 MED ORDER — SPIRONOLACTONE 25 MG PO TABS: 25.0000 mg | ORAL_TABLET | Freq: Every day | ORAL | 2 refills | Status: DC

## 2017-01-12 NOTE — Progress Notes (Signed)
Patient discharged home. Home meds that were stored in pharmacy were given back to patient. Instructions given to patient. Prescriptions were escribed. Daughter transporting patient home.

## 2017-01-12 NOTE — Discharge Summary (Signed)
[facility] at [place] NAME: Annette Foster    MR#:  [id number]  DATE OF BIRTH:  02/15/1940  DATE OF ADMISSION:  01/10/2017   ADMITTING PHYSICIAN: Lance Coon, MD  DATE OF DISCHARGE:  01/12/17  PRIMARY CARE PHYSICIAN: Albina Billet, MD   ADMISSION DIAGNOSIS:   Dehydration [E86.0] Weak [R53.1] Abdominal pain, unspecified abdominal location [R10.9]  DISCHARGE DIAGNOSIS:   Principal Problem:   Nausea vomiting and diarrhea Active Problems:   Hypothyroidism   HTN (hypertension)   Diabetes ([place])   GERD (gastroesophageal reflux disease)   SECONDARY DIAGNOSIS:   Past Medical History:  Diagnosis Date  . Abdominal pain   . Allergy   . Cancer [facility]) 2017   breast cancer- Left  . Cataract   . CHF (congestive heart failure) ([place])   . Collagenous colitis   . Diabetes mellitus without complication ([place])   . Diarrhea   . Diverticulosis   . Fibrocystic breast   . GERD (gastroesophageal reflux disease)   . Heart murmur   . Hyperlipidemia   . Hypertension   . Hypothyroidism   . IBS (irritable bowel syndrome)   . IBS (irritable bowel syndrome)   . IDA (iron deficiency anemia)   . Personal history of radiation therapy   . PONV (postoperative nausea and vomiting)   . Sleep apnea    C-Pap    HOSPITAL COURSE:   76 year old female with past medical history significant for history of breast cancer in remission, hypertension, IBS, collagenous colitis with chronic diarrhea, sleep apnea and hypothyroidism presents to hospital secondary to worsening nausea, vomiting and diarrhea.  #1 gastroenteritis-has acute gastroenteritis with nausea/vomiting and diarrhea symptoms -Stool studies are negative for C. difficile. Diarrhea has resolved. -Nausea is improved and on a regular diet at this time. -CT of the abdomen showing mesenteric edema and some congestion -Started Lasix and Aldactone.   -tolerating regular diet  #2 neuropathy-continue  gabapentin  #3 hypothyroidism-continue Synthroid  #4 hypertension-continue home  medications  #6 hypokalemia-replaced- f/u today  #7 Recurrent UTIs-following with Dr. Erlene Quan as outpatient and is supposed to get cystoscopy at some point. -2 weeks ago urine cultures were growing Escherichia coli. Patient was started on oral antibiotic ED at however due to her persistent nausea and vomiting started 2 weeks ago, she hasn't taken her antibiotics. -Urine still shows bacteria and leukocytes here.  -Started on Rocephin in the hospital and being discharged on Keflex.  #8 fatty liver disease-CT of the abdomen showing some changes of nonalcoholic steatohepatitis and some mesenteric edema. Restarted her Lasix and also Aldactone was added. -Recommended follow-up with her GI as outpatient.  Feels better, discharge him today   DISCHARGE CONDITIONS:   Guarded  CONSULTS OBTAINED:   None  DRUG ALLERGIES:   Allergies  Allergen Reactions  . Fentanyl Shortness Of Breath, Itching and Nausea And Vomiting  . Codeine Itching and Nausea And Vomiting  . Demeclocycline Other (See Comments) and Rash  . Demerol [Meperidine] Itching and Nausea And Vomiting  . Hydrocodone-Acetaminophen Itching and Nausea And Vomiting  . Other Other (See Comments)  . Oxycodone Itching and Nausea And Vomiting  . Pentazocine Other (See Comments)  . Tetracyclines & Related Rash   DISCHARGE MEDICATIONS:   Allergies as of 01/12/2017      Reactions   Fentanyl Shortness Of Breath, Itching, Nausea And Vomiting   Codeine Itching, Nausea And Vomiting   Demeclocycline Other (See Comments), Rash   Demerol [meperidine] Itching, Nausea  And Vomiting   Hydrocodone-acetaminophen Itching, Nausea And Vomiting   Other Other (See Comments)   Oxycodone Itching, Nausea And Vomiting   Pentazocine Other (See Comments)   Tetracyclines & Related Rash      Medication List    STOP taking these medications   glimepiride 2 MG  tablet Commonly known as:  AMARYL   lansoprazole 30 MG capsule Commonly known as:  PREVACID   metFORMIN 750 MG 24 hr tablet Commonly known as:  GLUCOPHAGE-XR   nitrofurantoin (macrocrystal-monohydrate) 100 MG capsule Commonly known as:  MACROBID   sulfamethoxazole-trimethoprim 800-160 MG tablet Commonly known as:  BACTRIM DS,SEPTRA DS     TAKE these medications   aspirin 81 MG tablet Take 81 mg by mouth at bedtime.   B-12 COMPLIANCE INJECTION 1000 MCG/ML Kit Generic drug:  Cyanocobalamin Inject 1 Syringe as directed every 30 (thirty) days.   budesonide-formoterol 80-4.5 MCG/ACT inhaler Commonly known as:  SYMBICORT Inhale 2 puffs into the lungs 2 (two) times daily as needed (shortness of breath).   cephALEXin 250 MG capsule Commonly known as:  KEFLEX Take 1 capsule (250 mg total) by mouth 3 (three) times daily for 5 days. X 5 days   cholecalciferol 1000 units tablet Commonly known as:  VITAMIN D Take 2,000 Units by mouth daily.   escitalopram 5 MG tablet Commonly known as:  LEXAPRO Take 5 mg by mouth at bedtime.   esomeprazole 40 MG capsule Commonly known as:  NEXIUM Take 40 mg by mouth daily at 12 noon.   fenofibrate 160 MG tablet Take 160 mg by mouth at bedtime.   fexofenadine 180 MG tablet Commonly known as:  ALLEGRA Take 180 mg by mouth at bedtime.   fluticasone 50 MCG/ACT nasal spray Commonly known as:  FLONASE Place 1 spray into the nose daily as needed for allergies.   furosemide 20 MG tablet Commonly known as:  LASIX Take 20-40 mg by mouth every other day. Alternate between 1 a day and 2 a day   gabapentin 300 MG capsule Commonly known as:  NEURONTIN Take 300 mg by mouth daily.   hydrOXYzine 25 MG tablet Commonly known as:  ATARAX/VISTARIL 1 or 2 tablets at night for itching and sleep   Hyoscyamine Sulfate 0.375 MG Tbcr Take 0.375 mg by mouth 2 (two) times daily.   letrozole 2.5 MG tablet Commonly known as:  FEMARA Take 1 tablet (2.5 mg  total) daily by mouth. Once a day.   levothyroxine 75 MCG tablet Commonly known as:  SYNTHROID, LEVOTHROID Take 75 mcg by mouth daily before breakfast.   magnesium oxide 400 MG tablet Commonly known as:  MAG-OX Take by mouth.   mirabegron ER 50 MG Tb24 tablet Commonly known as:  MYRBETRIQ Take 1 tablet (50 mg total) by mouth daily.   MISC NATURAL PRODUCTS PO Take by mouth.   montelukast 10 MG tablet Commonly known as:  SINGULAIR Take 10 mg by mouth at bedtime.   nystatin-triamcinolone cream Commonly known as:  MYCOLOG II Apply 1 application topically 2 (two) times daily.   olopatadine 0.1 % ophthalmic solution Commonly known as:  PATANOL 1 drop 2 (two) times daily.   ondansetron 4 MG tablet Commonly known as:  ZOFRAN Take 4 mg by mouth every 6 (six) hours as needed. for nausea   potassium chloride SA 20 MEQ tablet Commonly known as:  K-DUR,KLOR-CON Take by mouth.   saccharomyces boulardii 250 MG capsule Commonly known as:  FLORASTOR Take 250 mg by mouth 2 (two)  times daily.   simvastatin 20 MG tablet Commonly known as:  ZOCOR Take 20 mg by mouth at bedtime.   spironolactone 25 MG tablet Commonly known as:  ALDACTONE Take 1 tablet (25 mg total) by mouth daily. Start taking on:  01/13/2017   TRESIBA FLEXTOUCH [place] Inject 10 Units into the skin at bedtime.   valACYclovir 500 MG tablet Commonly known as:  VALTREX Take 500 mg by mouth 2 (two) times daily.   valsartan 160 MG tablet Commonly known as:  DIOVAN Take 160 mg by mouth every morning.        DISCHARGE INSTRUCTIONS:   1. PCP follow-up in 1-2 weeks 2. GI follow up in 2-3 weeks  DIET:   Cardiac diet  ACTIVITY:   Activity as tolerated  OXYGEN:   Home Oxygen: No.  Oxygen Delivery: room air  DISCHARGE LOCATION:   home   If you experience worsening of your admission symptoms, develop shortness of breath, life threatening emergency, suicidal or homicidal thoughts you must seek medical  attention immediately by calling 911 or calling your MD immediately  if symptoms less severe.  You Must read complete instructions/literature along with all the possible adverse reactions/side effects for all the Medicines you take and that have been prescribed to you. Take any new Medicines after you have completely understood and accpet all the possible adverse reactions/side effects.   Please note  You were cared for by a hospitalist during your hospital stay. If you have any questions about your discharge medications or the care you received while you were in the hospital after you are discharged, you can call the unit and asked to speak with the hospitalist on call if the hospitalist that took care of you is not available. Once you are discharged, your primary care physician will handle any further medical issues. Please note that NO REFILLS for any discharge medications will be authorized once you are discharged, as it is imperative that you return to your primary care physician (or establish a relationship with a primary care physician if you do not have one) for your aftercare needs so that they can reassess your need for medications and monitor your lab values.    On the day of Discharge:  VITAL SIGNS:   Blood pressure (!) 138/49, pulse (!) 53, temperature 97.8 F (36.6 C), temperature source Oral, resp. rate 18, height 5' 3"  (1.6 m), weight 78.8 kg (173 lb 11.2 oz), SpO2 94 %.  PHYSICAL EXAMINATION:    GENERAL:  76 y.o.-year-old patient lying in the bed with no acute distress.  EYES: Pupils equal, round, reactive to light and accommodation. No scleral icterus. Extraocular muscles intact.  HEENT: Head atraumatic, normocephalic. Oropharynx and nasopharynx clear.  NECK:  Supple, no jugular venous distention. No thyroid enlargement, no tenderness.  LUNGS: Normal breath sounds bilaterally, no wheezing, rales,rhonchi or crepitation. No use of accessory muscles of respiration.   CARDIOVASCULAR: S1, S2 normal. No murmurs, rubs, or gallops.  ABDOMEN: Soft, nontender,slightly distended. Bowel sounds present. No organomegaly or mass.  EXTREMITIES: No pedal edema, cyanosis, or clubbing.  NEUROLOGIC: Cranial nerves II through XII are intact. Muscle strength 5/5 in all extremities. Sensation intact. Gait not checked.  PSYCHIATRIC: The patient is alert and oriented x 3.  SKIN: No obvious rash, lesion, or ulcer.     DATA REVIEW:   CBC Recent Labs  Lab 01/11/17 1536  WBC 2.3*  HGB 10.1*  HCT 30.6*  PLT 111*    Chemistries  Recent Labs  Lab 01/10/17 1347 01/11/17 1536  NA 134* 138  K 3.4* 3.0*  CL 98* 105  CO2 26 24  GLUCOSE 150* 209*  BUN 17 9  CREATININE 0.67 0.56  CALCIUM 8.5* 8.1*  AST 47*  --   ALT 28  --   ALKPHOS 75  --   BILITOT 0.7  --      Microbiology Results  Results for orders placed or performed during the hospital encounter of 01/10/17  Gastrointestinal Panel by PCR , Stool     Status: None   Collection Time: 01/10/17  2:04 PM  Result Value Ref Range Status   Campylobacter species NOT DETECTED NOT DETECTED Final   Plesimonas shigelloides NOT DETECTED NOT DETECTED Final   Salmonella species NOT DETECTED NOT DETECTED Final   Yersinia enterocolitica NOT DETECTED NOT DETECTED Final   Vibrio species NOT DETECTED NOT DETECTED Final   Vibrio cholerae NOT DETECTED NOT DETECTED Final   Enteroaggregative E coli (EAEC) NOT DETECTED NOT DETECTED Final   Enteropathogenic E coli (EPEC) NOT DETECTED NOT DETECTED Final   Enterotoxigenic E coli (ETEC) NOT DETECTED NOT DETECTED Final   Shiga like toxin producing E coli (STEC) NOT DETECTED NOT DETECTED Final   Shigella/Enteroinvasive E coli (EIEC) NOT DETECTED NOT DETECTED Final   Cryptosporidium NOT DETECTED NOT DETECTED Final   Cyclospora cayetanensis NOT DETECTED NOT DETECTED Final   Entamoeba histolytica NOT DETECTED NOT DETECTED Final   Giardia lamblia NOT DETECTED NOT DETECTED Final    Adenovirus F40/41 NOT DETECTED NOT DETECTED Final   Astrovirus NOT DETECTED NOT DETECTED Final   Norovirus GI/GII NOT DETECTED NOT DETECTED Final   Rotavirus A NOT DETECTED NOT DETECTED Final   Sapovirus (I, II, IV, and V) NOT DETECTED NOT DETECTED Final  C difficile quick scan w PCR reflex     Status: None   Collection Time: 01/10/17  2:04 PM  Result Value Ref Range Status   C Diff antigen NEGATIVE NEGATIVE Final   C Diff toxin NEGATIVE NEGATIVE Final   C Diff interpretation No C. difficile detected.  Final    RADIOLOGY:  No results found.   Management plans discussed with the patient, family and they are in agreement.  CODE STATUS:     Code Status Orders  (From admission, onward)        Start     Ordered   01/11/17 0213  Full code  Continuous     01/11/17 0212    Code Status History    Date Active Date Inactive Code Status Order ID Comments User Context   This patient has a current code status but no historical code status.    Advance Directive Documentation     Most Recent Value  Type of Advance Directive  Healthcare Power of Attorney, Living will  Pre-existing out of facility DNR order (yellow form or pink MOST form)  No data  "MOST" Form in Place?  No data      TOTAL TIME TAKING CARE OF THIS PATIENT: 38 minutes.    Gladstone Lighter M.D on 01/12/2017 at 10:56 AM  Between 7am to 6pm - Pager - [phone]  After 6pm go to [email or web address] - Proofreader  [facility]  Office  [phone]  CC: Primary care physician; Albina Billet, MD   Note: This dictation was prepared with Dragon dictation along with smaller phrase technology. Any transcriptional errors that result from this process are unintentional.

## 2017-01-12 NOTE — Progress Notes (Signed)
Patient walked around nurse's station once. Tolerated well.

## 2017-01-13 ENCOUNTER — Telehealth: Payer: Self-pay | Admitting: *Deleted

## 2017-01-13 ENCOUNTER — Encounter: Payer: Medicare Other | Admitting: Physical Therapy

## 2017-01-13 LAB — URINE CULTURE: Culture: 80000 — AB

## 2017-01-13 NOTE — Telephone Encounter (Signed)
-----   Message from [place] sent at 01/13/2017  4:40 PM EST ----- Contact: [phone] Please call pt back about lab and bx results- I left Rodena Piety message earlier b/c pt knows Anita.-thx!

## 2017-01-13 NOTE — Telephone Encounter (Signed)
Patient had labs draw on 12/1 due a hospitalization. She already had a cbc drawn. She would like to cnl tom. Apt. I explained to her that I would cnl this lab draw per her request. She also would like to talk to Dr. B further about her FISH bone marrow biopsy results. She states that she was not informed of these test results at the last visit. Dr. Rogue Bussing, please reach out to pt to further discuss. Thanks. Nira Conn

## 2017-01-14 ENCOUNTER — Inpatient Hospital Stay: Payer: Medicare Other

## 2017-01-14 ENCOUNTER — Other Ambulatory Visit: Payer: Self-pay

## 2017-01-14 ENCOUNTER — Encounter: Payer: Self-pay | Admitting: Urology

## 2017-01-14 ENCOUNTER — Ambulatory Visit (INDEPENDENT_AMBULATORY_CARE_PROVIDER_SITE_OTHER): Payer: Medicare Other | Admitting: Urology

## 2017-01-14 VITALS — BP 160/69 | HR 56 | Ht 63.0 in | Wt 174.6 lb

## 2017-01-14 DIAGNOSIS — N390 Urinary tract infection, site not specified: Secondary | ICD-10-CM | POA: Diagnosis not present

## 2017-01-14 DIAGNOSIS — R350 Frequency of micturition: Secondary | ICD-10-CM | POA: Diagnosis not present

## 2017-01-14 DIAGNOSIS — M6289 Other specified disorders of muscle: Secondary | ICD-10-CM | POA: Diagnosis not present

## 2017-01-14 LAB — URINALYSIS, COMPLETE
Bilirubin, UA: NEGATIVE
Glucose, UA: NEGATIVE
Ketones, UA: NEGATIVE
Leukocytes, UA: NEGATIVE
Nitrite, UA: NEGATIVE
Protein, UA: NEGATIVE
Specific Gravity, UA: 1.01 (ref 1.005–1.030)
Urobilinogen, Ur: 0.2 mg/dL (ref 0.2–1.0)
pH, UA: 7 (ref 5.0–7.5)

## 2017-01-14 NOTE — Progress Notes (Signed)
01/14/2017 4:00 PM   Annette Foster 1940/02/16 [id number]  Referring provider: Albina Billet, MD [address]   [place], [place] [address]  Chief Complaint  Patient presents with  . Follow-up    HPI: 76 year old female previously followed by Dr. Louis Meckel with chronic pressure, dysuria, frequency, urgency, and pelvic floor dysfunction.  She returns today after recent admission for urinary tract infection.  She was first noted to have an E. coli UTI by her primary care, Dr. Hall Busing on 12/11/2016.  The organism was fairly resistant, sensitive only to nitrofurantoin, Zosyn, tobramycin, ceftriaxone, and cefepime.  She was incompletely treated for this UTI given her inability to tolerate antibiotics.  She had 2 subsequent positive urines on 12/26/2016 and then ultimately again on 01/11/2017 necessitating admission for IV abx.    Prior to this, despite her urinary symptoms, she had no positive urinalyses.  This is her first true infection.  It appears that this infection was the same organism which was incompletely treated.  Most recent cross-sectional imaging on 01/10/2017 in the form of CT abdomen pelvis with contrast shows normal kidneys bilaterally without stones.  She did have some intra-abdominal findings including mild ascites, mesenteric edema, hepatocellular disease/cirrhosis.  In terms of her urinary symptoms, she is improving.  She has been followed by physical therapy and improving dramatically.  She is also on Myrbetriq 50 mg which is helping her urgency frequency symptoms.  She is currently under treatment for breast cancer.  Today, she denies any burning.  Her UA is unremarkable.   PMH: Past Medical History:  Diagnosis Date  . Abdominal pain   . Allergy   . Cancer [facility]) 2017   breast cancer- Left  . Cataract   . CHF (congestive heart failure) ([place])   . Collagenous colitis   . Diabetes mellitus without complication ([place])   . Diarrhea   . Diverticulosis   . Fibrocystic  breast   . GERD (gastroesophageal reflux disease)   . Heart murmur   . Hyperlipidemia   . Hypertension   . Hypothyroidism   . IBS (irritable bowel syndrome)   . IBS (irritable bowel syndrome)   . IDA (iron deficiency anemia)   . Personal history of radiation therapy   . PONV (postoperative nausea and vomiting)   . Sleep apnea    C-Pap    Surgical History: Past Surgical History:  Procedure Laterality Date  . ABDOMINAL HYSTERECTOMY     tah bso  . ABDOMINAL SURGERY    . APPENDECTOMY    . BREAST BIOPSY Bilateral    negative  . 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 BREAST BIOPSY Left 05/27/2016   neg  . CARDIAC SURGERY    . CATARACT EXTRACTION Right   . CHOLECYSTECTOMY    . COLONOSCOPY WITH PROPOFOL N/A 03/11/2016   Procedure: COLONOSCOPY WITH PROPOFOL;  Surgeon: Manya Silvas, MD;  Location: [facility] ENDOSCOPY;  Service: Endoscopy;  Laterality: N/A;  . ESOPHAGOGASTRODUODENOSCOPY (EGD) WITH PROPOFOL N/A 03/11/2016   Procedure: ESOPHAGOGASTRODUODENOSCOPY (EGD) WITH PROPOFOL;  Surgeon: Manya Silvas, MD;  Location: [facility] ENDOSCOPY;  Service: Endoscopy;  Laterality: N/A;  . JOINT REPLACEMENT Left    TKR  . left sinusplasty     . MASTECTOMY, PARTIAL Left 10/17/2015   Procedure: MASTECTOMY PARTIAL REVISION;  Surgeon: Leonie Green, MD;  Location: [facility] ORS;  Service: General;  Laterality: Left;  . PARTIAL MASTECTOMY WITH NEEDLE LOCALIZATION Left 09/29/2015   Procedure: PARTIAL MASTECTOMY  WITH NEEDLE LOCALIZATION;  Surgeon: Leonie Green, MD;  Location: [facility] ORS;  Service: General;  Laterality: Left;  . TOTAL ABDOMINAL HYSTERECTOMY W/ BILATERAL SALPINGOOPHORECTOMY      Home Medications:  Allergies as of 01/14/2017      Reactions   Fentanyl Shortness Of Breath, Itching, Nausea And Vomiting   Codeine Itching, Nausea And Vomiting   Demeclocycline Other (See Comments), Rash   Demerol [meperidine] Itching, Nausea And Vomiting    Hydrocodone-acetaminophen Itching, Nausea And Vomiting   Other Other (See Comments)   Oxycodone Itching, Nausea And Vomiting   Pentazocine Other (See Comments)   Tetracyclines & Related Rash      Medication List        Accurate as of 01/14/17 11:59 PM. Always use your most recent med list.          aspirin 81 MG tablet Take 81 mg by mouth at bedtime.   B-12 COMPLIANCE INJECTION 1000 MCG/ML Kit Generic drug:  Cyanocobalamin Inject 1 Syringe as directed every 30 (thirty) days.   budesonide-formoterol 80-4.5 MCG/ACT inhaler Commonly known as:  SYMBICORT Inhale 2 puffs into the lungs 2 (two) times daily as needed (shortness of breath).   cephALEXin 250 MG capsule Commonly known as:  KEFLEX Take 1 capsule (250 mg total) by mouth 3 (three) times daily for 5 days. X 5 days   cholecalciferol 1000 units tablet Commonly known as:  VITAMIN D Take 2,000 Units by mouth daily.   escitalopram 5 MG tablet Commonly known as:  LEXAPRO Take 5 mg by mouth at bedtime.   esomeprazole 40 MG capsule Commonly known as:  NEXIUM Take 40 mg by mouth daily at 12 noon.   fenofibrate 160 MG tablet Take 160 mg by mouth at bedtime.   fexofenadine 180 MG tablet Commonly known as:  ALLEGRA Take 180 mg by mouth at bedtime.   fluticasone 50 MCG/ACT nasal spray Commonly known as:  FLONASE Place 1 spray into the nose daily as needed for allergies.   furosemide 20 MG tablet Commonly known as:  LASIX Take 20-40 mg by mouth every other day. Alternate between 1 a day and 2 a day   gabapentin 300 MG capsule Commonly known as:  NEURONTIN Take 300 mg by mouth daily.   hydrOXYzine 25 MG tablet Commonly known as:  ATARAX/VISTARIL 1 or 2 tablets at night for itching and sleep   Hyoscyamine Sulfate 0.375 MG Tbcr Take 0.375 mg by mouth 2 (two) times daily.   letrozole 2.5 MG tablet Commonly known as:  FEMARA Take 1 tablet (2.5 mg total) daily by mouth. Once a day.   levothyroxine 75 MCG  tablet Commonly known as:  SYNTHROID, LEVOTHROID Take 75 mcg by mouth daily before breakfast.   magnesium oxide 400 MG tablet Commonly known as:  MAG-OX Take by mouth.   mirabegron ER 50 MG Tb24 tablet Commonly known as:  MYRBETRIQ Take 1 tablet (50 mg total) by mouth daily.   MISC NATURAL PRODUCTS PO Take by mouth.   montelukast 10 MG tablet Commonly known as:  SINGULAIR Take 10 mg by mouth at bedtime.   nystatin-triamcinolone cream Commonly known as:  MYCOLOG II Apply 1 application topically 2 (two) times daily.   olopatadine 0.1 % ophthalmic solution Commonly known as:  PATANOL 1 drop 2 (two) times daily.   ondansetron 4 MG tablet Commonly known as:  ZOFRAN Take 4 mg by mouth every 6 (six) hours as needed. for nausea   potassium chloride SA 20 MEQ  tablet Commonly known as:  K-DUR,KLOR-CON Take by mouth.   saccharomyces boulardii 250 MG capsule Commonly known as:  FLORASTOR Take 250 mg by mouth 2 (two) times daily.   simvastatin 20 MG tablet Commonly known as:  ZOCOR Take 20 mg by mouth at bedtime.   spironolactone 25 MG tablet Commonly known as:  ALDACTONE Take 1 tablet (25 mg total) by mouth daily.   TRESIBA FLEXTOUCH [place] Inject 10 Units into the skin at bedtime.   valACYclovir 500 MG tablet Commonly known as:  VALTREX Take 500 mg by mouth 2 (two) times daily.   valsartan 160 MG tablet Commonly known as:  DIOVAN Take 160 mg by mouth every morning.       Allergies:  Allergies  Allergen Reactions  . Fentanyl Shortness Of Breath, Itching and Nausea And Vomiting  . Codeine Itching and Nausea And Vomiting  . Demeclocycline Other (See Comments) and Rash  . Demerol [Meperidine] Itching and Nausea And Vomiting  . Hydrocodone-Acetaminophen Itching and Nausea And Vomiting  . Other Other (See Comments)  . Oxycodone Itching and Nausea And Vomiting  . Pentazocine Other (See Comments)  . Tetracyclines & Related Rash    Family History: Family History   Problem Relation Age of Onset  . Breast cancer Paternal Grandmother   . Colon cancer Father   . Diabetes Sister   . Diabetes Brother   . Heart disease Brother   . Prostate cancer Brother   . Colon cancer Maternal Uncle   . Prostate cancer Brother   . Bladder Cancer Brother   . Ovarian cancer Neg Hx   . Kidney cancer Neg Hx     Social History:  reports that  has never smoked. she has never used smokeless tobacco. She reports that she does not drink alcohol or use drugs.  ROS: UROLOGY Frequent Urination?: Yes Hard to postpone urination?: Yes Burning/pain with urination?: Yes Get up at night to urinate?: Yes Leakage of urine?: No Urine stream starts and stops?: No Trouble starting stream?: No Do you have to strain to urinate?: No Blood in urine?: No Urinary tract infection?: Yes Sexually transmitted disease?: No Injury to kidneys or bladder?: No Painful intercourse?: No Weak stream?: No Currently pregnant?: No Vaginal bleeding?: Yes Last menstrual period?: n  Gastrointestinal Nausea?: No Vomiting?: No Indigestion/heartburn?: No Diarrhea?: No Constipation?: No  Constitutional Fever: No Night sweats?: No Weight loss?: No Fatigue?: Yes  Skin Skin rash/lesions?: No Itching?: No  Eyes Blurred vision?: No Double vision?: No  Ears/Nose/Throat Sore throat?: No Sinus problems?: Yes  Hematologic/Lymphatic Swollen glands?: No Easy bruising?: No  Cardiovascular Leg swelling?: No Chest pain?: No  Respiratory Cough?: Yes Shortness of breath?: No  Endocrine Excessive thirst?: Yes  Musculoskeletal Back pain?: No Joint pain?: No  Neurological Headaches?: No Dizziness?: No  Psychologic Depression?: No Anxiety?: No  Physical Exam: BP (!) 160/69 (BP Location: Right Arm, Patient Position: Sitting, Cuff Size: Normal)   Pulse (!) 56   Ht 5' 3"  (1.6 m)   Wt 174 lb 9.6 oz (79.2 kg)   LMP  (LMP Unknown)   BMI 30.93 kg/m   Constitutional:  Alert and  oriented, No acute distress. HEENT: [place] AT, moist mucus membranes.  Trachea midline, no masses. Cardiovascular: No clubbing, cyanosis, or edema. Respiratory: Normal respiratory effort, no increased work of breathing. GI: Abdomen is soft, nontender, nondistended, no abdominal masses GU: No CVA tenderness.  Skin: No rashes, bruises or suspicious lesions. Neurologic: Grossly intact, no focal deficits, moving all  4 extremities. Psychiatric: Normal mood and affect.  Laboratory Data: Lab Results  Component Value Date   WBC 2.3 (L) 01/11/2017   HGB 10.1 (L) 01/11/2017   HCT 30.6 (L) 01/11/2017   MCV 86.5 01/11/2017   PLT 111 (L) 01/11/2017    Lab Results  Component Value Date   CREATININE 0.66 01/12/2017    Urinalysis Results for orders placed or performed in visit on 01/14/17  Urinalysis, Complete  Result Value Ref Range   Specific Gravity, UA 1.010 1.005 - 1.030   pH, UA 7.0 5.0 - 7.5   Color, UA Yellow Yellow   Appearance Ur Clear Clear   Leukocytes, UA Negative Negative   Protein, UA Negative Negative/Trace   Glucose, UA Negative Negative   Ketones, UA Negative Negative   RBC, UA 1+ (A) Negative   Bilirubin, UA Negative Negative   Urobilinogen, Ur 0.2 0.2 - 1.0 mg/dL   Nitrite, UA Negative Negative    Pertinent Imaging: CT abdomen pelvis with contrast 01/10/2017 unremarkable for any GU pathology.  Assessment & Plan:     1. 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  Given her difficulty tolerating and taking a full course of antibiotics, suspect this was an incompletely treated infection rather than recurrent infection. Prior to this, she has no significant history of culture proven UTIs Recent upper tract imaging unremarkable Bladder emptying today adequate At this point in time, no clear role for cystoscopy Recommendations including cranberry tabs twice daily, probiotics, timed and double voiding were all reviewed in detail She was encouraged  to call our office specifically for same-day visit for UA/urine culture if she feels like she has an infection No role for suppressive antibiotics at this time especially in the setting of her difficulty tolerating these medications and multi-drug-resistant infection UA today unremarkable - Urinalysis, Complete  2. Pelvic floor dysfunction Encouraged to continue physical therapy Improving  3. Urinary frequency Continue Myrbetriq 50 mg  Return in about 6 months (around 07/15/2017) for recheck bladder symptoms, PVR .  Or sooner as needed  Hollice Espy, MD  [place] [address], [place], [place] [address] [phone]  I spent 25 min with this patient of which greater than 50% was spent in counseling and coordination of care with the patient.

## 2017-01-17 ENCOUNTER — Telehealth: Payer: Self-pay | Admitting: Internal Medicine

## 2017-01-17 MED ORDER — PROMETHAZINE HCL 25 MG PO TABS: 25.0000 mg | ORAL_TABLET | Freq: Four times a day (QID) | ORAL | 0 refills | Status: DC | PRN

## 2017-01-17 NOTE — Telephone Encounter (Signed)
Spoke to pt re: FISH/cytogenetics-normal.  Patient recently in the hospital for nausea vomiting diarrhea-question colitis.  Patient still having nausea vomiting-called in a prescription for Phenergan at patient's request

## 2017-01-17 NOTE — Telephone Encounter (Signed)
Dr. B will contact patient.

## 2017-01-23 ENCOUNTER — Ambulatory Visit (INDEPENDENT_AMBULATORY_CARE_PROVIDER_SITE_OTHER): Payer: Medicare Other | Admitting: Obstetrics and Gynecology

## 2017-01-23 ENCOUNTER — Encounter: Payer: Self-pay | Admitting: Obstetrics and Gynecology

## 2017-01-23 VITALS — BP 138/58 | HR 64 | Ht 63.0 in | Wt 162.0 lb

## 2017-01-23 DIAGNOSIS — B3731 Acute candidiasis of vulva and vagina: Secondary | ICD-10-CM

## 2017-01-23 DIAGNOSIS — B373 Candidiasis of vulva and vagina: Secondary | ICD-10-CM

## 2017-01-23 DIAGNOSIS — L308 Other specified dermatitis: Secondary | ICD-10-CM

## 2017-01-23 DIAGNOSIS — L408 Other psoriasis: Secondary | ICD-10-CM | POA: Diagnosis not present

## 2017-01-23 NOTE — Progress Notes (Signed)
Chief complaint: 1.  Chronic vulvitis 2.  History of UTI, recurrent  Patient presents today for follow-up after completing treatment for chronic Monilia vulvitis.  She is asymptomatic at this time.  She is very pleased with her lack of symptomatology. Patient was hospitalized early December for colitis times 72 hours.  She is much improved at this time with resolution of nausea and vomiting.  She has lost weight through that hospitalization experience. The patient's metformin and glyburide have been discontinued and she is only on insulin at this time.  She states that her blood sugars are less than 130 on recent checking. The patient has seen Dr. Hollice Espy for history of recurrent UTI.  She has been given instructions regarding strategies to lessen episodes of cystitis, and she is to follow-up with Dr. Erlene Quan for any potential symptomatology suggestive of infection.  Past medical history, past surgical history, problem list, medications, and allergies are reviewed  OBJECTIVE: BP (!) 138/58   Pulse 64   Ht 5' 3"  (1.6 m)   Wt 162 lb (73.5 kg)   LMP  (LMP Unknown)   BMI 28.70 kg/m  Pleasant elderly female in no acute distress.  Alert and oriented. Pelvic exam: External genitalia-minimal vulvar hyperemia and perianal chronic skin changes without leukoplakia or epithelial skin breakdown BUS-normal Vagina-moderate to market atrophy; good vault support  ASSESSMENT: 1.  Chronic Monilia vulvitis, resolved 2.  History of spongiform dermatitis, clinically stable 3.  History of UTIs, status post urology consultation  PLAN: 1.  No further treatment for vulvitis at this time 2.  Strongly encouraged to maintain good blood sugar control 3.  Follow-up with Dr. Hollice Espy for any UTI symptoms 4.  Return in 6 months for follow-up on chronic vulvitis  A total of 15 minutes were spent face-to-face with the patient during this encounter and over half of that time dealt with counseling and  coordination of care.  Brayton Mars, MD  Note: This dictation was prepared with Dragon dictation along with smaller phrase technology. Any transcriptional errors that result from this process are unintentional.

## 2017-01-23 NOTE — Patient Instructions (Signed)
1.  No further treatment is needed for chronic vulvitis at this time 2.  Maintain good blood sugar control 3.  Return in 6 months for follow-up on vulvar itching, or sooner if symptoms worsen 4.  Follow-up with Dr. Erlene Quan in urology for any possible recurrent UTI symptoms.

## 2017-01-30 ENCOUNTER — Telehealth: Payer: Self-pay | Admitting: *Deleted

## 2017-01-30 ENCOUNTER — Other Ambulatory Visit: Payer: Self-pay | Admitting: Student

## 2017-01-30 DIAGNOSIS — R932 Abnormal findings on diagnostic imaging of liver and biliary tract: Secondary | ICD-10-CM

## 2017-01-30 DIAGNOSIS — R945 Abnormal results of liver function studies: Secondary | ICD-10-CM

## 2017-01-30 DIAGNOSIS — R7989 Other specified abnormal findings of blood chemistry: Secondary | ICD-10-CM

## 2017-01-30 NOTE — Telephone Encounter (Signed)
Patient called to say that she had seen her GI doctors at [facility] clinic last week. The GI told her that she needed to schedule an appointment with Dr. Burlene Arnt ASAP. She has an appointment on 03/20/2017.

## 2017-01-30 NOTE — Telephone Encounter (Signed)
RN reviewed chart- NP at [facility] would like pt to see Dr. Rogue Bussing. Patient's ANC this week was 0.7. Pt also had an extensive lab work up to r/o Hepatitis and IDA. Pt also has an u/s scheduled for 01/31/17 in am. I offered an apt in the afternoon with Dr. B but patient has to take her husband to the [place] clinic on Friday. She requested an apt for 12/24. I provided her an apt at 10 am with Dr. Jacinto Reap.

## 2017-01-30 NOTE — Telephone Encounter (Signed)
Opened in error

## 2017-01-31 ENCOUNTER — Ambulatory Visit
Admission: RE | Admit: 2017-01-31 | Discharge: 2017-01-31 | Disposition: A | Discharge status: Home / Self Care | Payer: Medicare Other | Source: Ambulatory Visit | Attending: Student | Admitting: Student

## 2017-01-31 DIAGNOSIS — R599 Enlarged lymph nodes, unspecified: Secondary | ICD-10-CM | POA: Diagnosis not present

## 2017-01-31 DIAGNOSIS — R161 Splenomegaly, not elsewhere classified: Secondary | ICD-10-CM | POA: Diagnosis not present

## 2017-01-31 DIAGNOSIS — R932 Abnormal findings on diagnostic imaging of liver and biliary tract: Secondary | ICD-10-CM | POA: Diagnosis present

## 2017-01-31 DIAGNOSIS — R945 Abnormal results of liver function studies: Secondary | ICD-10-CM | POA: Diagnosis present

## 2017-01-31 DIAGNOSIS — R7989 Other specified abnormal findings of blood chemistry: Secondary | ICD-10-CM

## 2017-02-03 ENCOUNTER — Inpatient Hospital Stay: Payer: Medicare Other

## 2017-02-03 ENCOUNTER — Other Ambulatory Visit: Payer: Self-pay

## 2017-02-03 ENCOUNTER — Inpatient Hospital Stay: Payer: Medicare Other | Attending: Internal Medicine | Admitting: Internal Medicine

## 2017-02-03 VITALS — BP 132/72 | HR 75 | Temp 97.5°F | Resp 20 | Ht 63.0 in | Wt 160.0 lb

## 2017-02-03 DIAGNOSIS — I10 Essential (primary) hypertension: Secondary | ICD-10-CM | POA: Insufficient documentation

## 2017-02-03 DIAGNOSIS — E785 Hyperlipidemia, unspecified: Secondary | ICD-10-CM | POA: Insufficient documentation

## 2017-02-03 DIAGNOSIS — I700 Atherosclerosis of aorta: Secondary | ICD-10-CM | POA: Insufficient documentation

## 2017-02-03 DIAGNOSIS — Z170 Estrogen receptor positive status [ER+]: Secondary | ICD-10-CM | POA: Diagnosis not present

## 2017-02-03 DIAGNOSIS — E119 Type 2 diabetes mellitus without complications: Secondary | ICD-10-CM | POA: Diagnosis not present

## 2017-02-03 DIAGNOSIS — E039 Hypothyroidism, unspecified: Secondary | ICD-10-CM | POA: Insufficient documentation

## 2017-02-03 DIAGNOSIS — D0512 Intraductal carcinoma in situ of left breast: Secondary | ICD-10-CM | POA: Diagnosis present

## 2017-02-03 DIAGNOSIS — N390 Urinary tract infection, site not specified: Secondary | ICD-10-CM | POA: Insufficient documentation

## 2017-02-03 DIAGNOSIS — Z79899 Other long term (current) drug therapy: Secondary | ICD-10-CM

## 2017-02-03 DIAGNOSIS — K219 Gastro-esophageal reflux disease without esophagitis: Secondary | ICD-10-CM | POA: Insufficient documentation

## 2017-02-03 DIAGNOSIS — Z7982 Long term (current) use of aspirin: Secondary | ICD-10-CM | POA: Insufficient documentation

## 2017-02-03 DIAGNOSIS — K589 Irritable bowel syndrome without diarrhea: Secondary | ICD-10-CM | POA: Diagnosis not present

## 2017-02-03 DIAGNOSIS — D61818 Other pancytopenia: Secondary | ICD-10-CM | POA: Diagnosis not present

## 2017-02-03 DIAGNOSIS — Z90710 Acquired absence of both cervix and uterus: Secondary | ICD-10-CM | POA: Diagnosis not present

## 2017-02-03 DIAGNOSIS — Z794 Long term (current) use of insulin: Secondary | ICD-10-CM | POA: Diagnosis not present

## 2017-02-03 DIAGNOSIS — Z9049 Acquired absence of other specified parts of digestive tract: Secondary | ICD-10-CM | POA: Diagnosis not present

## 2017-02-03 DIAGNOSIS — Z9221 Personal history of antineoplastic chemotherapy: Secondary | ICD-10-CM | POA: Insufficient documentation

## 2017-02-03 DIAGNOSIS — Z923 Personal history of irradiation: Secondary | ICD-10-CM | POA: Insufficient documentation

## 2017-02-03 DIAGNOSIS — K760 Fatty (change of) liver, not elsewhere classified: Secondary | ICD-10-CM | POA: Insufficient documentation

## 2017-02-03 DIAGNOSIS — Z9012 Acquired absence of left breast and nipple: Secondary | ICD-10-CM | POA: Diagnosis not present

## 2017-02-03 LAB — RETICULOCYTES
RBC.: 4.12 MIL/uL (ref 3.80–5.20)
Retic Count, Absolute: 107.1 10*3/uL (ref 19.0–183.0)
Retic Ct Pct: 2.6 % (ref 0.4–3.1)

## 2017-02-03 LAB — CBC WITH DIFFERENTIAL/PLATELET
Basophils Absolute: 0.1 10*3/uL (ref 0–0.1)
Basophils Relative: 4 %
Eosinophils Absolute: 0.1 10*3/uL (ref 0–0.7)
Eosinophils Relative: 3 %
HCT: 34.9 % — ABNORMAL LOW (ref 35.0–47.0)
Hemoglobin: 11.5 g/dL — ABNORMAL LOW (ref 12.0–16.0)
Lymphocytes Relative: 42 %
Lymphs Abs: 1.2 10*3/uL (ref 1.0–3.6)
MCH: 28.8 pg (ref 26.0–34.0)
MCHC: 33 g/dL (ref 32.0–36.0)
MCV: 87.5 fL (ref 80.0–100.0)
Monocytes Absolute: 0.3 10*3/uL (ref 0.2–0.9)
Monocytes Relative: 11 %
Neutro Abs: 1.1 10*3/uL — ABNORMAL LOW (ref 1.4–6.5)
Neutrophils Relative %: 40 %
Platelets: 134 10*3/uL — ABNORMAL LOW (ref 150–440)
RBC: 3.99 MIL/uL (ref 3.80–5.20)
RDW: 15.7 % — ABNORMAL HIGH (ref 11.5–14.5)
WBC: 2.9 10*3/uL — ABNORMAL LOW (ref 3.6–11.0)

## 2017-02-03 LAB — VITAMIN B12: Vitamin B-12: 501 pg/mL (ref 180–914)

## 2017-02-03 LAB — DAT, POLYSPECIFIC AHG (ARMC ONLY): Polyspecific AHG test: NEGATIVE

## 2017-02-03 LAB — TECHNOLOGIST SMEAR REVIEW: Tech Review: DECREASED

## 2017-02-03 LAB — LACTATE DEHYDROGENASE: LDH: 147 U/L (ref 98–192)

## 2017-02-03 NOTE — Progress Notes (Signed)
[place] NOTE  Patient Care Team: Albina Billet, MD as PCP - General (Internal Medicine)  CHIEF COMPLAINTS/PURPOSE OF CONSULTATION:   Oncology History   # AUG 2017- DCIS LEFT BREAST ER/PR- POS; positive margins [Dr.Smith]; s/p re-excision; s/p RT [finish RT Nov 10th 2017]; START ARIMIDEX- Jan 2018; Stopped in July 2018- sec to muscle cramps; Sep 2018- start Letrozole.  # Mild pancytopenia [CT-jan 2018-no hepatomegaly/spleneomegaly]. OCT 2018- BMBx- MILD dyspoiesis; variable cellularity [10-50%]; FISH/Cytogenetics-NORMAL.   # HRT [clinical trial thru [place]; stopped July 2017 ]; CPAP      Ductal carcinoma in situ (DCIS) of left breast     Oncology History   # AUG 2017- DCIS LEFT BREAST ER/PR- POS; positive margins [Dr.Smith]; s/p re-excision; s/p RT [finish RT Nov 10th 2017]; START ARIMIDEX- Jan 2018; Stopped in July 2018- sec to muscle cramps; Sep 2018- start Letrozole.  # Mild pancytopenia [CT-jan 2018-no hepatomegaly/spleneomegaly]. OCT 2018- BMBx- MILD dyspoiesis; variable cellularity [10-50%]; FISH/Cytogenetics-NORMAL.   # HRT [clinical trial thru [facility]; stopped July 2017 ]; CPAP      Ductal carcinoma in situ (DCIS) of left breast     HISTORY OF PRESENTING ILLNESS:  Annette Foster 76 y.o.  female pleasant patient with left breast DCIS; And also history of pancytopenia-status post recent bone marrow biopsy is here for follow-up.  Patient was recently evaluated by GI-for her worsening cytopenias.  She has been referred to [place] for further evaluation and recommendations.  Patient admits to have profuse night sweats- "drenching"-needing to change her night clothes; 2-3 episodes a week for the last 3-4 months.  She denies any weight loss.  No recent infections.  Chronic mild easy bruising.  Patient complains of mild dysuria back pain over the last few days.  She has been evaluated by urology.  ROS: A complete 10 point review of system is done which is  negative except mentioned above in history of present illness  MEDICAL HISTORY:  Past Medical History:  Diagnosis Date  . Abdominal pain   . Allergy   . Cancer [facility]) 2017   breast cancer- Left  . Cataract   . CHF (congestive heart failure) ([place])   . Collagenous colitis   . Diabetes mellitus without complication ([place])   . Diarrhea   . Diverticulosis   . Fatty liver disease, nonalcoholic   . Fibrocystic breast   . GERD (gastroesophageal reflux disease)   . Heart murmur   . Hyperlipidemia   . Hypertension   . Hypothyroidism   . IBS (irritable bowel syndrome)   . IBS (irritable bowel syndrome)   . IDA (iron deficiency anemia)   . Personal history of radiation therapy   . PONV (postoperative nausea and vomiting)   . Sleep apnea    C-Pap    SURGICAL HISTORY: Past Surgical History:  Procedure Laterality Date  . ABDOMINAL HYSTERECTOMY     [facility] bso  . ABDOMINAL SURGERY    . APPENDECTOMY    . BREAST BIOPSY Bilateral    negative  . 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 BREAST BIOPSY Left 05/27/2016   neg  . CARDIAC SURGERY    . CATARACT EXTRACTION Right   . CHOLECYSTECTOMY    . COLONOSCOPY WITH PROPOFOL N/A 03/11/2016   Procedure: COLONOSCOPY WITH PROPOFOL;  Surgeon: Manya Silvas, MD;  Location: [facility] END[facility];  Service: Endoscopy;  Laterality: N/A;  . ESOPHAGOGASTRODUODENOSCOPY (EGD) WITH PROPOFOL N/A 03/11/2016   Procedure: ESOPHAGOGASTRODUODENOSCOPY (EGD) WITH  PROPOFOL;  Surgeon: Manya Silvas, MD;  Location: [facility] ENDOSCOPY;  Service: Endoscopy;  Laterality: N/A;  . JOINT REPLACEMENT Left    TKR  . left sinusplasty     . MASTECTOMY, PARTIAL Left 10/17/2015   Procedure: MASTECTOMY PARTIAL REVISION;  Surgeon: Leonie Green, MD;  Location: [facility] ORS;  Service: General;  Laterality: Left;  . PARTIAL MASTECTOMY WITH NEEDLE LOCALIZATION Left 09/29/2015   Procedure: PARTIAL MASTECTOMY WITH NEEDLE LOCALIZATION;  Surgeon: Leonie Green, MD;  Location: [facility] ORS;  Service: General;  Laterality: Left;  . 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  Social History   Socioeconomic History  . Marital status: Married    Spouse name: Not on file  . Number of children: Not on file  . Years of education: Not on file  . Highest education level: Not on file  Social Needs  . Financial resource strain: Not on file  . Food insecurity - worry: Not on file  . Food insecurity - inability: Not on file  . Transportation needs - medical: Not on file  . Transportation needs - non-medical: Not on file  Occupational History  . Not on file  Tobacco Use  . Smoking status: Never Smoker  . Smokeless tobacco: Never Used  Substance and Sexual Activity  . Alcohol use: No    Alcohol/week: 0.0 oz  . Drug use: No  . Sexual activity: Not Currently    Birth control/protection: Surgical  Other Topics Concern  . Not on file  Social History Narrative  . Not on file    FAMILY HISTORY: Family History  Problem Relation Age of Onset  . Breast cancer Paternal Grandmother   . Colon cancer Father   . Diabetes Sister   . Diabetes Brother   . Heart disease Brother   . Prostate cancer Brother   . Colon cancer Maternal Uncle   . Prostate cancer Brother   . Bladder Cancer Brother   . Ovarian cancer Neg Hx   . Kidney cancer Neg Hx     ALLERGIES:  is allergic to fentanyl; codeine; demeclocycline; demerol [meperidine]; hydrocodone-acetaminophen; other; oxycodone; pentazocine; and tetracyclines & related.  MEDICATIONS:  Current Outpatient Medications  Medication Sig Dispense Refill  . aspirin 81 MG tablet Take 81 mg by mouth at bedtime.    . cholecalciferol (VITAMIN D) 1000 units tablet Take 2,000 Units by mouth daily.     . Cyanocobalamin (B-12 COMPLIANCE INJECTION) 1000 MCG/ML KIT Inject 1 Syringe as directed every 30 (thirty) days.    Marland Kitchen  escitalopram (LEXAPRO) 5 MG tablet Take 5 mg by mouth at bedtime.    Marland Kitchen esomeprazole (NEXIUM) 40 MG capsule Take 40 mg by mouth daily at 12 noon.     . fenofibrate 160 MG tablet Take 160 mg by mouth at bedtime.    . fexofenadine (ALLEGRA) 180 MG tablet Take 180 mg by mouth at bedtime.     . furosemide (LASIX) 20 MG tablet Take 20-40 mg by mouth every other day. Alternate between 1 a day and 2 a day    . gabapentin (NEURONTIN) 300 MG capsule Take 300 mg by mouth daily.    Marland Kitchen glimepiride (AMARYL) 2 MG tablet Take 2 mg by mouth at bedtime.    . hydrOXYzine (ATARAX/VISTARIL) 25 MG tablet 1 or 2 tablets at night for itching and sleep 30 tablet 2  . Hyoscyamine Sulfate 0.375 MG TBCR  Take 0.375 mg by mouth 2 (two) times daily.     . Insulin Degludec (TRESIBA FLEXTOUCH [place]) Inject 10 Units into the skin at bedtime.    Marland Kitchen letrozole (FEMARA) 2.5 MG tablet Take 1 tablet (2.5 mg total) daily by mouth. Once a day. 90 tablet 0  . levothyroxine (SYNTHROID, LEVOTHROID) 75 MCG tablet Take 75 mcg by mouth daily before breakfast.     . magnesium oxide (MAG-OX) 400 MG tablet Take by mouth.    . mirabegron ER (MYRBETRIQ) 50 MG TB24 tablet Take 1 tablet (50 mg total) by mouth daily. 30 tablet 3  . MISC NATURAL PRODUCTS PO Take by mouth.    . montelukast (SINGULAIR) 10 MG tablet Take 10 mg by mouth at bedtime.     Marland Kitchen olopatadine (PATANOL) 0.1 % ophthalmic solution 1 drop 2 (two) times daily.    . potassium chloride SA (K-DUR,KLOR-CON) 20 MEQ tablet Take by mouth.    . saccharomyces boulardii (FLORASTOR) 250 MG capsule Take 250 mg by mouth 2 (two) times daily.     . simvastatin (ZOCOR) 20 MG tablet Take 20 mg by mouth at bedtime.    Marland Kitchen spironolactone (ALDACTONE) 25 MG tablet Take 1 tablet (25 mg total) by mouth daily. 30 tablet 2  . valACYclovir (VALTREX) 500 MG tablet Take 500 mg by mouth 2 (two) times daily.  1  . valsartan (DIOVAN) 160 MG tablet Take 160 mg by mouth every morning.     . budesonide-formoterol (SYMBICORT)  80-4.5 MCG/ACT inhaler Inhale 2 puffs into the lungs 2 (two) times daily as needed (shortness of breath).     . fluticasone (FLONASE) 50 MCG/ACT nasal spray Place 1 spray into the nose daily as needed for allergies.     Marland Kitchen ondansetron (ZOFRAN) 4 MG tablet Take 4 mg by mouth every 6 (six) hours as needed. for nausea  1  . promethazine (PHENERGAN) 25 MG tablet Take 1 tablet (25 mg total) by mouth every 6 (six) hours as needed for nausea or vomiting. (Patient not taking: Reported on 02/03/2017) 30 tablet 0   No current facility-administered medications for this visit.       Marland Kitchen  PHYSICAL EXAMINATION: ECOG PERFORMANCE STATUS: 0 - Asymptomatic  Vitals:   02/03/17 1021  BP: 132/72  Pulse: 75  Resp: 20  Temp: (!) 97.5 F (36.4 C)   Filed Weights   02/03/17 1021  Weight: 160 lb (72.6 kg)    GENERAL: Well-nourished well-developed; Alert, no distress and comfortable.  Alone.  EYES: no pallor or icterus OROPHARYNX: no thrush or ulceration; good dentition  NECK: supple, no masses felt LYMPH:  no palpable lymphadenopathy in the cervical, axillary or inguinal regions LUNGS: clear to auscultation and  No wheeze or crackles HEART/CVS: regular rate & rhythm and no murmurs; No lower extremity edema ABDOMEN: abdomen soft, tender in LLQ with normal bowel sounds Musculoskeletal:no cyanosis of digits and no clubbing  PSYCH: alert & oriented x 3 with fluent speech NEURO: no focal motor/sensory deficits SKIN:  no rashes or significant lesions  LABORATORY DATA:  I have reviewed the data as listed Lab Results  Component Value Date   WBC 2.9 (L) 02/03/2017   HGB 11.5 (L) 02/03/2017   HCT 34.9 (L) 02/03/2017   MCV 87.5 02/03/2017   PLT 134 (L) 02/03/2017   Recent Labs    05/15/16 1340 10/25/16 0941 01/10/17 1347 01/11/17 1536 01/12/17 1104  NA 138 138 134* 138 137  K 3.9 4.4 3.4* 3.0* 3.7  CL 104 102 98* 105 104  CO2 28 27 26 24 24   GLUCOSE 242* 209* 150* 209* 208*  BUN 11 15 17 9 8    CREATININE 0.61 0.57 0.67 0.56 0.66  CALCIUM 9.4 9.2 8.5* 8.1* 8.5*  GFRNONAA >60 >60 >60 >60 >60  GFRAA >60 >60 >60 >60 >60  PROT 7.2 7.3 6.9  --   --   ALBUMIN 3.9 3.7 3.0*  --   --   AST 45* 70* 47*  --   --   ALT 26 37 28  --   --   ALKPHOS 57 75 75  --   --   BILITOT 0.5 0.5 0.7  --   --     RADIOGRAPHIC STUDIES: I have personally reviewed the radiological images as listed and agreed with the findings in the report. Dg Wrist Complete Left  Result Date: 01/12/2017 CLINICAL DATA:  Fall on outstretched hand. EXAM: LEFT WRIST - COMPLETE 3+ VIEW COMPARISON:  None. FINDINGS: Advanced degenerative changes at the left first carpometacarpal joint. No acute fracture, subluxation or dislocation. IMPRESSION: No acute bony abnormality. Electronically Signed   By: Rolm Baptise M.D.   On: 01/12/2017 13:33   US Abdomen Complete  Result Date: 01/31/2017 CLINICAL DATA:  Abnormal liver function test. EXAM: ABDOMEN ULTRASOUND COMPLETE COMPARISON:  CT abdomen 01/10/2017 FINDINGS: Gallbladder: Prior cholecystectomy. Common bile duct: Diameter: 6 mm Liver: No focal lesion identified. Increased hepatic parenchymal echogenicity. Portal vein is patent on color Doppler imaging with normal direction of blood flow towards the liver. Enlarge middle portal vein measuring 1.7 cm in diameter. IVC: No abnormality visualized. Pancreas: Visualized portion unremarkable. 2 x 1.9 x 0.7 cm soft tissue echogenic nodule adjacent to the pancreatic head likely reflecting a mildly enlarged lymph node. Spleen: Mild splenomegaly measuring 12.9 cm in length with a volume of 805 mL. Right Kidney: Length: 11.2 cm. Echogenicity within normal limits. No mass or hydronephrosis visualized. Left Kidney: Length: 12.3 cm. Echogenicity within normal limits. No mass or hydronephrosis visualized. Abdominal aorta: No aneurysm visualized. Other findings: Small volume ascites. IMPRESSION: 1. Increased hepatic echogenicity as can be seen with hepatic  steatosis. 2. Mild splenomegaly. 3. Mild enlarged lymph node adjacent to the pancreatic head measuring 2 x 1.9 x 0.7 cm. Electronically Signed   By: Kathreen Devoid   On: 01/31/2017 14:52   Ct Abdomen Pelvis W Contrast  Result Date: 01/10/2017 CLINICAL DATA:  Abdominal distention, vomiting, and diarrhea for 2 weeks. EXAM: CT ABDOMEN AND PELVIS WITH CONTRAST TECHNIQUE: Multidetector CT imaging of the abdomen and pelvis was performed using the standard protocol following bolus administration of intravenous contrast. CONTRAST:  14m ISOVUE-300 IOPAMIDOL (ISOVUE-300) INJECTION 61% COMPARISON:  03/07/2016 FINDINGS: Lower Chest: Tiny right pleural effusion. Interstitial prominence in both lung bases, which may be due to mild interstitial edema. Hepatobiliary: No hepatic masses identified. Mild caudate and left hepatic lobe hypertrophy seen, suspicious for cirrhosis. Minimal perihepatic ascites which is new since previous study. Pancreas:  No mass or inflammatory changes. Spleen: Mild splenomegaly and minimal perisplenic ascites. No splenic masses identified. Adrenals/Urinary Tract: No masses identified. No evidence of hydronephrosis. Unremarkable unopacified urinary bladder. Stomach/Bowel: No evidence of obstruction, focal inflammatory process, or abscess. Vascular/Lymphatic: Increased mild lymphadenopathy in porta hepatis, with largest lymph node measuring 1.6 cm on image 24/2. Sub-cm retroperitoneal and mesenteric lymph nodes are again seen measuring up to 9 mm, without significant change. This is likely reactive in etiology. Aortic atherosclerosis. No evidence of abdominal aortic aneurysm. Reproductive:  Prior hysterectomy.  No adnexal mass identified. Other:  Mild diffuse mesenteric edema. Musculoskeletal:  No suspicious bone lesions identified. IMPRESSION: New mild ascites and diffuse mesenteric edema. No evidence of focal inflammatory process or abscess. 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 New tiny right pleural effusion, and bibasilar interstitial prominence suspicious for pulmonary interstitial edema. Electronically Signed   By: Earle Gell M.D.   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 [White count-2.7 ANC-0.8- 1.3; hemoglobin 10-11 platelets 101]- slightly worse from previous. [id number] BMBx- mild dyspoiesis noted.  10-50% variable hypercellular; cyto-FISH NORMAL.   #The etiology of above pancytopenia is unclear-question MDS versus other causes.  Check B12/methylmalonic acid; zinc and copper; LDH; PNH.   #Drenching night sweats-question related to letrozole versus others.  Recommend stopping letrozole until directed.   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   #Question cirrhotic liver changes; mild splenomegaly; portal adenopathy-question etiology.  It is not clear/unlikely- if this could be because of patient's mild pancytopenia reviewed the note from GI.  # Repeated UTI-awaiting UA today; with urology.  # DM- poorly controlled. A1c- 9.4 on insulin; as per PCP.   #Recommend follow-up in 3 weeks; no labs.   Dr.Tate  Other pancytopenia ([place]) #Mild pancytopenia thrombocytopenia-at least since 2017. [White count-2.7 ANC- 1.3; hemoglobin 11 platelets 101]- slightly worse from previous. [id number] BMBx- mild dyspoiesis noted.  10-50% variable hypercellular; cyto-FISH pending. Check zinc; copper. Discussed the concerns of possibility of MDS but is currently not being manifested on the current bone marrow biopsy. FISH/Cytohgentics- normal.  # DCIS/papillary carcinoma in situ-  left breast  status post RE- excision on adjuvant RT [finish 10th Nov 2017]. On letrozole- ? Night sewaets??   # Sokaing nght sweats- ? letrzole 2-3 week;3-4 months reocmmdn holding Letrzole.   # Bil flank pain; dysuria- months [Dr.Herrick; Urology]; check UA.   # DM- poorly controlled. A1c- 9.4 on insulin; as per PCP.   #von b12..  # follow up in 3 week/No labs-   Dr.Tate/GI doc.Cammie Sickle, MD 02/03/2017 12:55 PM

## 2017-02-03 NOTE — Patient Instructions (Signed)
STOP LETROZOLE. # keep record of "night sweats" when come off letrozole.

## 2017-02-03 NOTE — Assessment & Plan Note (Addendum)
#   Mild pancytopenia thrombocytopenia-at least since 2017. [White count-2.7 ANC-0.8- 1.3; hemoglobin 10-11 platelets 101]- slightly worse from previous. [id number] BMBx- mild dyspoiesis noted.  10-50% variable hypercellular; cyto-FISH NORMAL.   #The etiology of above pancytopenia is unclear-question MDS versus other causes.  Check B12/methylmalonic acid; zinc and copper; LDH; PNH.   #Drenching night sweats-question related to letrozole versus others.  Recommend stopping letrozole until directed.   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   #Question cirrhotic liver changes; mild splenomegaly; portal adenopathy-question etiology.  It is not clear/unlikely- if this could be because of patient's mild pancytopenia reviewed the note from GI.  # Repeated UTI-awaiting UA today; with urology.  # DM- poorly controlled. A1c- 9.4 on insulin; as per PCP.   #Recommend follow-up in 3 weeks; no labs.   Dr.Tate

## 2017-02-03 NOTE — Assessment & Plan Note (Addendum)
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 [White count-2.7 ANC- 1.3; hemoglobin 11 platelets 101]- slightly worse from previous. [id number] BMBx- mild dyspoiesis noted.  10-50% variable hypercellular; cyto-FISH pending. Check zinc; copper. Discussed the concerns of possibility of MDS but is currently not being manifested on the current bone marrow biopsy. FISH/Cytohgentics- normal.  # DCIS/papillary carcinoma in situ-  left breast status post RE- excision on adjuvant RT [finish 10th Nov 2017]. On letrozole- ? Night sewaets??   # Sokaing nght sweats- ? letrzole 2-3 week;3-4 months reocmmdn holding Letrzole.   # Bil flank pain; dysuria- months [Dr.Herrick; Urology]; check UA.   # DM- poorly controlled. A1c- 9.4 on insulin; as per PCP.   #von b12..  # follow up in 3 week/No labs-   Dr.Tate/GI doc.Marland Kitchen

## 2017-02-04 LAB — ERYTHROPOIETIN: Erythropoietin: 18.1 m[IU]/mL (ref 2.6–18.5)

## 2017-02-04 LAB — HAPTOGLOBIN: Haptoglobin: 52 mg/dL (ref 34–200)

## 2017-02-05 ENCOUNTER — Ambulatory Visit: Payer: Medicare Other

## 2017-02-05 VITALS — BP 126/70 | HR 71 | Ht 63.0 in | Wt 161.0 lb

## 2017-02-05 DIAGNOSIS — N390 Urinary tract infection, site not specified: Secondary | ICD-10-CM

## 2017-02-05 LAB — URINALYSIS, COMPLETE
Bilirubin, UA: NEGATIVE
Glucose, UA: NEGATIVE
Ketones, UA: NEGATIVE
Nitrite, UA: NEGATIVE
Specific Gravity, UA: 1.02 (ref 1.005–1.030)
Urobilinogen, Ur: 1 mg/dL (ref 0.2–1.0)
pH, UA: 7 (ref 5.0–7.5)

## 2017-02-05 LAB — MICROSCOPIC EXAMINATION: WBC, UA: >30 /hpf — ABNORMAL HIGH (ref 0–?)

## 2017-02-05 MED ORDER — CEFUROXIME AXETIL 500 MG PO TABS: 500.0000 mg | ORAL_TABLET | Freq: Two times a day (BID) | ORAL | 0 refills | Status: DC

## 2017-02-05 NOTE — Progress Notes (Signed)
Pt presents today with c/o urinary frequency and urgency, hard to postpone urination, dysuria, leakage of urine, back pain, lower abd pain, chills, and night sweats. A clean catch was obtained for u/a and cx.   Blood pressure 126/70, pulse 71, height 5' 3"  (1.6 m), weight 161 lb (73 kg).  Per Dr. Bernardo Heater ceftin 561m bid x7 days was sent to pt pharmacy.

## 2017-02-06 LAB — ZINC: Zinc: 63 ug/dL (ref 56–134)

## 2017-02-06 LAB — COPPER, SERUM: Copper: 141 ug/dL (ref 72–166)

## 2017-02-07 LAB — CULTURE, URINE COMPREHENSIVE

## 2017-02-07 LAB — METHYLMALONIC ACID, SERUM: Methylmalonic Acid, Quantitative: 209 nmol/L (ref 0–378)

## 2017-02-10 LAB — PNH PROFILE (-HIGH SENSITIVITY): Viability:: 95

## 2017-02-17 ENCOUNTER — Ambulatory Visit (INDEPENDENT_AMBULATORY_CARE_PROVIDER_SITE_OTHER): Payer: Medicare Other

## 2017-02-17 ENCOUNTER — Ambulatory Visit: Payer: Medicare Other | Admitting: Podiatry

## 2017-02-17 VITALS — BP 129/72 | HR 66 | Temp 97.6°F | Ht 63.0 in | Wt 160.0 lb

## 2017-02-17 DIAGNOSIS — R300 Dysuria: Secondary | ICD-10-CM | POA: Diagnosis not present

## 2017-02-17 LAB — URINALYSIS, COMPLETE
Bilirubin, UA: NEGATIVE
Glucose, UA: NEGATIVE
Ketones, UA: NEGATIVE
Leukocytes, UA: NEGATIVE
Nitrite, UA: NEGATIVE
Protein, UA: NEGATIVE
RBC, UA: NEGATIVE
Specific Gravity, UA: 1.02 (ref 1.005–1.030)
Urobilinogen, Ur: 0.2 mg/dL (ref 0.2–1.0)
pH, UA: 6 (ref 5.0–7.5)

## 2017-02-17 LAB — MICROSCOPIC EXAMINATION
Epithelial Cells (non renal): NONE SEEN /hpf (ref 0–10)
RBC, UA: NONE SEEN /hpf (ref 0–?)

## 2017-02-17 NOTE — Progress Notes (Signed)
Pt presents today with c/o urinary frequency, urgency, hard to postpone urination, dysuria, leakage of urine, back pain, lower abd pain, night sweats, and chills. Pt has been on ceftin in the last 30 days for a UTI. A CATH specimen was obtained for u/a and cx.   Blood pressure 129/72, pulse 66, temperature 97.6 F (36.4 C), height 5\' 3"  (1.6 m), weight 160 lb (72.6 kg).

## 2017-02-20 ENCOUNTER — Encounter: Payer: Self-pay | Admitting: Podiatry

## 2017-02-20 ENCOUNTER — Telehealth: Payer: Self-pay

## 2017-02-20 ENCOUNTER — Ambulatory Visit (INDEPENDENT_AMBULATORY_CARE_PROVIDER_SITE_OTHER): Payer: Medicare Other | Admitting: Podiatry

## 2017-02-20 DIAGNOSIS — B351 Tinea unguium: Secondary | ICD-10-CM | POA: Diagnosis not present

## 2017-02-20 DIAGNOSIS — M79674 Pain in right toe(s): Secondary | ICD-10-CM

## 2017-02-20 DIAGNOSIS — M79675 Pain in left toe(s): Secondary | ICD-10-CM

## 2017-02-20 LAB — CULTURE, URINE COMPREHENSIVE

## 2017-02-20 NOTE — Telephone Encounter (Signed)
-----   Message from Nori Riis, PA-C sent at 02/20/2017  1:37 PM EST ----- Please let Mrs. Kitner know that her urine culture was negative.

## 2017-02-20 NOTE — Progress Notes (Signed)
Complaint:  Visit Type: Patient returns to my office for continued preventative foot care services. Complaint: Patient states" my nails have grown long and thick and become painful to walk and wear shoes" Patient has been diagnosed with DM with no foot complications. The patient presents for preventative foot care services. No changes to ROS  Podiatric Exam: Vascular: dorsalis pedis and posterior tibial pulses are palpable bilateral. Capillary return is immediate. Temperature gradient is WNL. Skin turgor WNL  Sensorium: Normal Semmes Weinstein monofilament test. Normal tactile sensation bilaterally. Nail Exam: Pt has thick disfigured discolored nails with subungual debris noted bilateral entire nail hallux through fifth toenails.  Right hallux nail plate is absent.  Surgery has healed. Ulcer Exam: There is no evidence of ulcer or pre-ulcerative changes or infection. Orthopedic Exam: Muscle tone and strength are WNL. No limitations in general ROM. No crepitus or effusions noted. Foot type and digits show no abnormalities. Bony prominences are unremarkable. Skin: No Porokeratosis. No infection or ulcers  Diagnosis:  Onychomycosis, , Pain in right toe, pain in left toes  Treatment & Plan Procedures and Treatment: Consent by patient was obtained for treatment procedures. The patient understood the discussion of treatment and procedures well. All questions were answered thoroughly reviewed. Debridement of mycotic and hypertrophic toenails, 1 through 5 bilateral and clearing of subungual debris. No ulceration, no infection noted.   Return Visit-Office Procedure: Patient instructed to return to the office for a follow up visit 3 months for continued evaluation and treatment.    Gardiner Barefoot DPM

## 2017-02-20 NOTE — Telephone Encounter (Signed)
Patient notified on vmail 

## 2017-02-24 ENCOUNTER — Encounter: Payer: Self-pay | Admitting: Internal Medicine

## 2017-02-24 ENCOUNTER — Inpatient Hospital Stay: Payer: Medicare Other | Attending: Internal Medicine | Admitting: Internal Medicine

## 2017-02-24 VITALS — BP 120/68 | HR 66 | Temp 98.0°F | Resp 16 | Wt 161.9 lb

## 2017-02-24 DIAGNOSIS — Z87440 Personal history of urinary (tract) infections: Secondary | ICD-10-CM | POA: Diagnosis not present

## 2017-02-24 DIAGNOSIS — R109 Unspecified abdominal pain: Secondary | ICD-10-CM

## 2017-02-24 DIAGNOSIS — D61818 Other pancytopenia: Secondary | ICD-10-CM

## 2017-02-24 DIAGNOSIS — Z86000 Personal history of in-situ neoplasm of breast: Secondary | ICD-10-CM | POA: Diagnosis present

## 2017-02-24 DIAGNOSIS — N12 Tubulo-interstitial nephritis, not specified as acute or chronic: Secondary | ICD-10-CM

## 2017-02-24 NOTE — Progress Notes (Signed)
[place] NOTE  Patient Care Team: Albina Billet, MD as PCP - General (Internal Medicine)  CHIEF COMPLAINTS/PURPOSE OF CONSULTATION:   Oncology History   # AUG 2017- DCIS LEFT BREAST ER/PR- POS; positive margins [Dr.Smith]; s/p re-excision; s/p RT [finish RT Nov 10th 2017]; START ARIMIDEX- Jan 2018; Stopped in July 2018- sec to muscle cramps; Sep 2018- start Letrozole.;  JAN 2019-STOPPED Letrozole sec night sweats"  # Mild pancytopenia [CT-jan 2018-no hepatomegaly/spleneomegaly]. OCT 2018- BMBx- MILD dyspoiesis; variable cellularity [10-50%]; FISH/Cytogenetics-NORMAL.   # HRT [clinical trial thru [place]; stopped July 2017 ]; CPAP      Ductal carcinoma in situ (DCIS) of left breast     Oncology History   # AUG 2017- DCIS LEFT BREAST ER/PR- POS; positive margins [Dr.Smith]; s/p re-excision; s/p RT [finish RT Nov 10th 2017]; START ARIMIDEX- Jan 2018; Stopped in July 2018- sec to muscle cramps; Sep 2018- start Letrozole.;  JAN 2019-STOPPED Letrozole sec night sweats"  # Mild pancytopenia [CT-jan 2018-no hepatomegaly/spleneomegaly]. OCT 2018- BMBx- MILD dyspoiesis; variable cellularity [10-50%]; FISH/Cytogenetics-NORMAL.   # HRT [clinical trial thru [facility]; stopped July 2017 ]; CPAP      Ductal carcinoma in situ (DCIS) of left breast     HISTORY OF PRESENTING ILLNESS:  Annette Foster 77 y.o.  female pleasant patient with left breast DCIS; And also history of pancytopenia-of unclear etiology is here for follow-up.  Patient has stopped taking Femara given night sweats.   Night sweats have improved since stopping Femara.   However she complains of bilateral flank pain; recent history of UTI.  Status post Bactrim  ROS: A complete 10 point review of system is done which is negative except mentioned above in history of present illness  MEDICAL HISTORY:  Past Medical History:  Diagnosis Date  . Abdominal pain   . Allergy   . Cancer [facility]) 2017   breast cancer- Left   . Cataract   . CHF (congestive heart failure) ([place])   . Collagenous colitis   . Diabetes mellitus without complication ([place])   . Diarrhea   . Diverticulosis   . Fatty liver disease, nonalcoholic   . Fibrocystic breast   . GERD (gastroesophageal reflux disease)   . Heart murmur   . Hyperlipidemia   . Hypertension   . Hypothyroidism   . IBS (irritable bowel syndrome)   . IBS (irritable bowel syndrome)   . IDA (iron deficiency anemia)   . Personal history of radiation therapy   . PONV (postoperative nausea and vomiting)   . Sleep apnea    C-Pap    SURGICAL HISTORY: Past Surgical History:  Procedure Laterality Date  . ABDOMINAL HYSTERECTOMY     tah bso  . ABDOMINAL SURGERY    . APPENDECTOMY    . BREAST BIOPSY Bilateral    negative  . 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 BREAST BIOPSY Left 05/27/2016   neg  . CARDIAC SURGERY    . CATARACT EXTRACTION Right   . CHOLECYSTECTOMY    . COLONOSCOPY WITH PROPOFOL N/A 03/11/2016   Procedure: COLONOSCOPY WITH PROPOFOL;  Surgeon: Manya Silvas, MD;  Location[facility] END[facility]COPY;  Service: Endoscopy;  Laterality: N/A;  . ESOPHAGOGASTRODUODENOSCOPY (EGD) WITH PROPOFOL N/A 03/11/2016   Procedure: ESOPHAGOGASTRODUODENOSCOPY (EGD) WITH PROPOFOL;  Surgeon: Manya Silvas, MD;  Location: [facility] ENDOSCOPY;  Service: Endoscopy;  Laterality: N/A;  . JOINT REPLACEMENT Left    TKR  . left sinusplasty     .  MASTECTOMY, PARTIAL Left 10/17/2015   Procedure: MASTECTOMY PARTIAL REVISION;  Surgeon: Leonie Green, MD;  Location: [facility] ORS;  Service: General;  Laterality: Left;  . PARTIAL MASTECTOMY WITH NEEDLE LOCALIZATION Left 09/29/2015   Procedure: PARTIAL MASTECTOMY WITH NEEDLE LOCALIZATION;  Surgeon: Leonie Green, MD;  Location: [facility] ORS;  Service: General;  Laterality: Left;  . 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  Social History   Socioeconomic History  . Marital status: Married    Spouse name: Not on file  . Number of children: Not on file  . Years of education: Not on file  . Highest education level: Not on file  Social Needs  . Financial resource strain: Not on file  . Food insecurity - worry: Not on file  . Food insecurity - inability: Not on file  . Transportation needs - medical: Not on file  . Transportation needs - non-medical: Not on file  Occupational History  . Not on file  Tobacco Use  . Smoking status: Never Smoker  . Smokeless tobacco: Never Used  Substance and Sexual Activity  . Alcohol use: No    Alcohol/week: 0.0 oz  . Drug use: No  . Sexual activity: Not Currently    Birth control/protection: Surgical  Other Topics Concern  . Not on file  Social History Narrative  . Not on file    FAMILY HISTORY: Family History  Problem Relation Age of Onset  . Breast cancer Paternal Grandmother   . Colon cancer Father   . Diabetes Sister   . Diabetes Brother   . Heart disease Brother   . Prostate cancer Brother   . Colon cancer Maternal Uncle   . Prostate cancer Brother   . Bladder Cancer Brother   . Ovarian cancer Neg Hx   . Kidney cancer Neg Hx     ALLERGIES:  is allergic to fentanyl; codeine; demeclocycline; demerol [meperidine]; hydrocodone-acetaminophen; other; oxycodone; pentazocine; and tetracyclines & related.  MEDICATIONS:  Current Outpatient Medications  Medication Sig Dispense Refill  . aspirin 81 MG tablet Take 81 mg by mouth at bedtime.    Marland Kitchen azithromycin (ZITHROMAX) 250 MG tablet TAKE 2 TABLETS BY MOUTH TODAY, THEN TAKE 1 TABLET DAILY FOR 4 DAYS  0  . budesonide-formoterol (SYMBICORT) 80-4.5 MCG/ACT inhaler Inhale 2 puffs into the lungs 2 (two) times daily as needed (shortness of breath).     . cefUROXime (CEFTIN) 500 MG tablet Take 1 tablet (500 mg total) by mouth 2 (two) times daily with a meal. 14 tablet 0  . cholecalciferol (VITAMIN  D) 1000 units tablet Take 2,000 Units by mouth daily.     . Cyanocobalamin (B-12 COMPLIANCE INJECTION) 1000 MCG/ML KIT Inject 1 Syringe as directed every 30 (thirty) days.    Marland Kitchen escitalopram (LEXAPRO) 5 MG tablet Take 5 mg by mouth at bedtime.    Marland Kitchen esomeprazole (NEXIUM) 40 MG capsule Take 40 mg by mouth daily at 12 noon.     . fenofibrate 160 MG tablet Take 160 mg by mouth at bedtime.    . fexofenadine (ALLEGRA) 180 MG tablet Take 180 mg by mouth at bedtime.     . fluticasone (FLONASE) 50 MCG/ACT nasal spray Place 1 spray into the nose daily as needed for allergies.     . furosemide (LASIX) 20 MG tablet Take 20-40 mg by mouth every other day. Alternate between 1 a day and 2 a day    .  gabapentin (NEURONTIN) 300 MG capsule Take 300 mg by mouth daily.    Marland Kitchen glimepiride (AMARYL) 2 MG tablet Take 2 mg by mouth at bedtime.    . hydrOXYzine (ATARAX/VISTARIL) 25 MG tablet 1 or 2 tablets at night for itching and sleep 30 tablet 2  . Hyoscyamine Sulfate 0.375 MG TBCR Take 0.375 mg by mouth 2 (two) times daily.     . Insulin Degludec (TRESIBA FLEXTOUCH [place]) Inject 10 Units into the skin at bedtime.    Marland Kitchen letrozole (FEMARA) 2.5 MG tablet Take 1 tablet (2.5 mg total) daily by mouth. Once a day. 90 tablet 0  . levothyroxine (SYNTHROID, LEVOTHROID) 75 MCG tablet Take 75 mcg by mouth daily before breakfast.     . magnesium oxide (MAG-OX) 400 MG tablet Take by mouth.    . mirabegron ER (MYRBETRIQ) 50 MG TB24 tablet Take 1 tablet (50 mg total) by mouth daily. 30 tablet 3  . MISC NATURAL PRODUCTS PO Take by mouth.    . montelukast (SINGULAIR) 10 MG tablet Take 10 mg by mouth at bedtime.     Marland Kitchen olopatadine (PATANOL) 0.1 % ophthalmic solution 1 drop 2 (two) times daily.    . ondansetron (ZOFRAN) 4 MG tablet Take 4 mg by mouth every 6 (six) hours as needed. for nausea  1  . potassium chloride SA (K-DUR,KLOR-CON) 20 MEQ tablet Take by mouth.    . promethazine (PHENERGAN) 25 MG tablet Take 1 tablet (25 mg total) by mouth  every 6 (six) hours as needed for nausea or vomiting. 30 tablet 0  . saccharomyces boulardii (FLORASTOR) 250 MG capsule Take 250 mg by mouth 2 (two) times daily.     . simvastatin (ZOCOR) 20 MG tablet Take 20 mg by mouth at bedtime.    Marland Kitchen spironolactone (ALDACTONE) 25 MG tablet Take 1 tablet (25 mg total) by mouth daily. 30 tablet 2  . valACYclovir (VALTREX) 500 MG tablet Take 500 mg by mouth 2 (two) times daily.  1  . valsartan (DIOVAN) 160 MG tablet Take 160 mg by mouth every morning.      No current facility-administered medications for this visit.       Marland Kitchen  PHYSICAL EXAMINATION: ECOG PERFORMANCE STATUS: 0 - Asymptomatic  Vitals:   02/24/17 0956  BP: 120/68  Pulse: 66  Resp: 16  Temp: 98 F (36.7 C)   Filed Weights   02/24/17 0956  Weight: 161 lb 14.4 oz (73.4 kg)    GENERAL: Well-nourished well-developed; Alert, no distress and comfortable.  Alone.  EYES: no pallor or icterus OROPHARYNX: no thrush or ulceration; good dentition  NECK: supple, no masses felt LYMPH:  no palpable lymphadenopathy in the cervical, axillary or inguinal regions LUNGS: clear to auscultation and  No wheeze or crackles HEART/CVS: regular rate & rhythm and no murmurs; No lower extremity edema ABDOMEN: abdomen soft, tender in LLQ with normal bowel sounds Musculoskeletal:no cyanosis of digits and no clubbing  PSYCH: alert & oriented x 3 with fluent speech NEURO: no focal motor/sensory deficits SKIN:  no rashes or significant lesions  LABORATORY DATA:  I have reviewed the data as listed Lab Results  Component Value Date   WBC 2.9 (L) 02/03/2017   HGB 11.5 (L) 02/03/2017   HCT 34.9 (L) 02/03/2017   MCV 87.5 02/03/2017   PLT 134 (L) 02/03/2017   Recent Labs    05/15/16 1340 10/25/16 0941 01/10/17 1347 01/11/17 1536 01/12/17 1104  NA 138 138 134* 138 137  K 3.9 4.4  3.4* 3.0* 3.7  CL 104 102 98* 105 104  CO2 28 27 26 24 24   GLUCOSE 242* 209* 150* 209* 208*  BUN 11 15 17 9 8   CREATININE  0.61 0.57 0.67 0.56 0.66  CALCIUM 9.4 9.2 8.5* 8.1* 8.5*  GFRNONAA >60 >60 >60 >60 >60  GFRAA >60 >60 >60 >60 >60  PROT 7.2 7.3 6.9  --   --   ALBUMIN 3.9 3.7 3.0*  --   --   AST 45* 70* 47*  --   --   ALT 26 37 28  --   --   ALKPHOS 57 75 75  --   --   BILITOT 0.5 0.5 0.7  --   --     RADIOGRAPHIC STUDIES: I have personally reviewed the radiological images as listed and agreed with the findings in the report. US Renal  Result Date: 02/26/2017 CLINICAL DATA:  The pyelonephritis. EXAM: RENAL / URINARY TRACT ULTRASOUND COMPLETE COMPARISON:  Abdominal ultrasound 01/31/2017. CT of the abdomen and pelvis 01/10/2017. FINDINGS: Right Kidney: Length: 12.3 cm, within normal limits. Echogenicity within normal limits. No mass or hydronephrosis visualized. Left Kidney: Length: 12.4 cm, within normal limits. Echogenicity within normal limits. No mass or hydronephrosis visualized. Bladder: Appears normal for degree of bladder distention. Bilateral ureteral jets are present. IMPRESSION: Unremarkable bilateral renal ultrasound. Electronically Signed   By: San Morelle M.D.   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 [White count-2.7 ANC- 1.3; hemoglobin 11 platelets 101]- slightly worse from previous. [id number] BMBx- mild dyspoiesis noted. 10-50% variable hypercellular;  Discussed the concerns of possibility of MDS but is currently not being manifested on the current bone marrow biopsy. FISH/Cytohgentics- normal.  Awaiting a second opinion  # DCIS/papillary carcinoma in situ-  left breast status post RE- excision on adjuvant RT [finish 10th Nov 2017]. HOLD for now sec to night sweats.  # Bil flank pain- recent UTI s/p anti-biotics; recommend Bil kidney US. Dysuria- improved. S/p recent bactrim.   # Soaking nght sweats- resolved; HOLD letrozole.  # follow up in 3 months/labs- will call re: US kidneys STAT.    Dr.Tate/GI doc     Cammie Sickle, MD 03/11/2017 8:15 PM

## 2017-02-24 NOTE — Assessment & Plan Note (Addendum)
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 [White count-2.7 ANC- 1.3; hemoglobin 11 platelets 101]- slightly worse from previous. [id number] BMBx- mild dyspoiesis noted. 10-50% variable hypercellular;  Discussed the concerns of possibility of MDS but is currently not being manifested on the current bone marrow biopsy. FISH/Cytohgentics- normal.  Awaiting a second opinion  # DCIS/papillary carcinoma in situ-  left breast status post RE- excision on adjuvant RT [finish 10th Nov 2017]. HOLD for now sec to night sweats.  # Bil flank pain- recent UTI s/p anti-biotics; recommend Bil kidney US. Dysuria- improved. S/p recent bactrim.   # Soaking nght sweats- resolved; HOLD letrozole.  # follow up in 3 months/labs- will call re: US kidneys STAT.   Dr.Tate/GI doc

## 2017-02-25 ENCOUNTER — Ambulatory Visit
Admission: RE | Admit: 2017-02-25 | Discharge: 2017-02-25 | Disposition: A | Discharge status: Home / Self Care | Payer: Medicare Other | Source: Ambulatory Visit | Attending: Internal Medicine | Admitting: Internal Medicine

## 2017-02-25 DIAGNOSIS — N12 Tubulo-interstitial nephritis, not specified as acute or chronic: Secondary | ICD-10-CM | POA: Diagnosis not present

## 2017-02-26 ENCOUNTER — Ambulatory Visit: Payer: Medicare Other | Admitting: Internal Medicine

## 2017-03-13 ENCOUNTER — Telehealth: Payer: Self-pay | Admitting: Internal Medicine

## 2017-03-13 NOTE — Telephone Encounter (Signed)
Spoke to [employer]-? MDS- She will order [facility]- on bone marrow biopsy.

## 2017-03-20 ENCOUNTER — Ambulatory Visit: Payer: Medicare Other | Admitting: Internal Medicine

## 2017-03-20 ENCOUNTER — Other Ambulatory Visit: Payer: Medicare Other

## 2017-05-05 ENCOUNTER — Ambulatory Visit (INDEPENDENT_AMBULATORY_CARE_PROVIDER_SITE_OTHER): Payer: Medicare Other

## 2017-05-05 VITALS — BP 131/63 | HR 65 | Ht 63.0 in | Wt 164.0 lb

## 2017-05-05 DIAGNOSIS — N390 Urinary tract infection, site not specified: Secondary | ICD-10-CM

## 2017-05-05 LAB — URINALYSIS, COMPLETE

## 2017-05-05 LAB — MICROSCOPIC EXAMINATION

## 2017-05-05 NOTE — Progress Notes (Addendum)
Pt presents today with c/o urinary frequency, urgency, hard to postpone urination, dysuria, gross hematuria, back pain, nausea, and vomiting. A clean catch was obtained for u/a and cx.   Blood pressure 131/63, pulse 65, height 5\' 3"  (1.6 m), weight 164 lb (74.4 kg).  Per Dr. Bernardo Heater cipro bid x7 was given.

## 2017-05-07 LAB — CULTURE, URINE COMPREHENSIVE

## 2017-05-09 ENCOUNTER — Telehealth: Payer: Self-pay

## 2017-05-09 MED ORDER — NITROFURANTOIN MONOHYD MACRO 100 MG PO CAPS: 100.0000 mg | ORAL_CAPSULE | Freq: Two times a day (BID) | ORAL | 0 refills | Status: AC

## 2017-05-09 NOTE — Telephone Encounter (Signed)
Called and informed pt of positive culture results. Macrobid 100mg  BID x 7 days sent to pharmacy, pt informed.

## 2017-05-09 NOTE — Telephone Encounter (Signed)
-----   Message from Abbie Sons, MD sent at 05/09/2017  7:41 AM EDT ----- Urine culture was positive but resistant to Cipro.  It was sensitive to Macrobid.  Please call in Macrobid 100 mg twice daily times 7 days

## 2017-05-13 ENCOUNTER — Emergency Department
Admission: EM | Admit: 2017-05-13 | Discharge: 2017-05-13 | Disposition: A | Discharge status: Home / Self Care | Payer: Medicare Other | Attending: Emergency Medicine | Admitting: Emergency Medicine

## 2017-05-13 ENCOUNTER — Encounter: Payer: Self-pay | Admitting: Medical Oncology

## 2017-05-13 DIAGNOSIS — E039 Hypothyroidism, unspecified: Secondary | ICD-10-CM | POA: Insufficient documentation

## 2017-05-13 DIAGNOSIS — I110 Hypertensive heart disease with heart failure: Secondary | ICD-10-CM | POA: Diagnosis not present

## 2017-05-13 DIAGNOSIS — E119 Type 2 diabetes mellitus without complications: Secondary | ICD-10-CM | POA: Insufficient documentation

## 2017-05-13 DIAGNOSIS — I509 Heart failure, unspecified: Secondary | ICD-10-CM | POA: Diagnosis not present

## 2017-05-13 DIAGNOSIS — Z853 Personal history of malignant neoplasm of breast: Secondary | ICD-10-CM | POA: Diagnosis not present

## 2017-05-13 DIAGNOSIS — R197 Diarrhea, unspecified: Secondary | ICD-10-CM | POA: Diagnosis present

## 2017-05-13 DIAGNOSIS — Z9049 Acquired absence of other specified parts of digestive tract: Secondary | ICD-10-CM | POA: Diagnosis not present

## 2017-05-13 DIAGNOSIS — Z96652 Presence of left artificial knee joint: Secondary | ICD-10-CM | POA: Diagnosis not present

## 2017-05-13 DIAGNOSIS — K859 Acute pancreatitis without necrosis or infection, unspecified: Secondary | ICD-10-CM | POA: Diagnosis not present

## 2017-05-13 LAB — GASTROINTESTINAL PANEL BY PCR, STOOL (REPLACES STOOL CULTURE)

## 2017-05-13 LAB — COMPREHENSIVE METABOLIC PANEL
ALT: 28 U/L (ref 14–54)
AST: 38 U/L (ref 15–41)
Albumin: 3.3 g/dL — ABNORMAL LOW (ref 3.5–5.0)
Alkaline Phosphatase: 67 U/L (ref 38–126)
Anion gap: 9 (ref 5–15)
BUN: 18 mg/dL (ref 6–20)
CO2: 26 mmol/L (ref 22–32)
Calcium: 8.3 mg/dL — ABNORMAL LOW (ref 8.9–10.3)
Chloride: 101 mmol/L (ref 101–111)
Creatinine, Ser: 0.77 mg/dL (ref 0.44–1.00)
GFR calc Af Amer: >60 mL/min (ref >60)
GFR calc non Af Amer: >60 mL/min (ref >60)
Glucose, Bld: 244 mg/dL — ABNORMAL HIGH (ref 65–99)
Potassium: 3 mmol/L — ABNORMAL LOW (ref 3.5–5.1)
Sodium: 136 mmol/L (ref 135–145)
Total Bilirubin: 1.1 mg/dL (ref 0.3–1.2)
Total Protein: 7.1 g/dL (ref 6.5–8.1)

## 2017-05-13 LAB — CBC
HCT: 31.2 % — ABNORMAL LOW (ref 35.0–47.0)
Hemoglobin: 10.4 g/dL — ABNORMAL LOW (ref 12.0–16.0)
MCH: 29.5 pg (ref 26.0–34.0)
MCHC: 33.3 g/dL (ref 32.0–36.0)
MCV: 88.6 fL (ref 80.0–100.0)
Platelets: 126 10*3/uL — ABNORMAL LOW (ref 150–440)
RBC: 3.52 MIL/uL — ABNORMAL LOW (ref 3.80–5.20)
RDW: 14.8 % — ABNORMAL HIGH (ref 11.5–14.5)
WBC: 5.1 10*3/uL (ref 3.6–11.0)

## 2017-05-13 LAB — URINALYSIS, COMPLETE (UACMP) WITH MICROSCOPIC: Specific Gravity, Urine: 1.03 (ref 1.005–1.030)

## 2017-05-13 LAB — LIPASE, BLOOD: Lipase: 144 U/L — ABNORMAL HIGH (ref 11–51)

## 2017-05-13 LAB — C DIFFICILE QUICK SCREEN W PCR REFLEX
C Diff antigen: NEGATIVE
C Diff interpretation: NOT DETECTED
C Diff toxin: NEGATIVE

## 2017-05-13 MED ORDER — SODIUM CHLORIDE 0.9 % IV SOLN
Freq: Once | INTRAVENOUS | Status: AC
  Administered 2017-05-13: 14:00:00 via INTRAVENOUS

## 2017-05-13 MED ORDER — ONDANSETRON HCL 4 MG/2ML IJ SOLN
4.0000 mg | Freq: Once | INTRAMUSCULAR | Status: AC
  Administered 2017-05-13: 4 mg via INTRAVENOUS
  Filled 2017-05-13: qty 2

## 2017-05-13 NOTE — ED Provider Notes (Signed)
From Dr. Jimmye Norman in this 77 year old female who was recently on Cipro now on [place] for a UTI.  Patient is presenting with diarrhea.  Plan is to follow-up with her stool studies.  Has a history of C. difficile.  Physical Exam  BP (!) 110/57   Pulse (!) 59   Temp 97.9 F (36.6 C) (Oral)   Resp 11   Ht 5\' 3"  (1.6 m)   Wt 72.6 kg (160 lb)   LMP  (LMP Unknown)   SpO2 98%   BMI 28.34 kg/m   ----------------------------------------- 6:03 PM on 05/13/2017 -----------------------------------------   Physical Exam Patient with mild tenderness throughout her abdomen without any focal tenderness to palpation.  No rebound or guarding. ED Course/Procedures     Procedures  MDM  Patient at this time with negative stool studies.  Lipase of 144.  However, no focal epigastric tenderness to palpation.  I did discuss the finding with the patient as well as family.  The patient says that she is a history of "colitis" which she takes high Cosamin for as well as a probiotic.  Says that she is usually does not take antibiotics for this.  We discussed a simple, low-fat diet over the next few days until the patient is able to eat herself back into her normal diet.  Patient will be discharged at this time.  She is understanding of the plan willing to comply.       Orbie Pyo, MD 05/13/17 [phone]

## 2017-05-13 NOTE — ED Provider Notes (Signed)
[facility] Emergency Department Provider Note       Time seen: ----------------------------------------- 1:56 PM on 05/13/2017 -----------------------------------------   I have reviewed the triage vital signs and the nursing notes.  HISTORY   Chief Complaint Diarrhea and Recurrent UTI    HPI Annette Foster is a 77 y.o. female with a history of CHF, collagenous colitis, diabetes, GERD, hyperlipidemia, hypertension, IBS who presents to the ED for diarrhea.  Patient reports last Monday she was seen by her primary care doctor for a UTI and was placed on Cipro.  She was called Thursday and told that her culture came back resistant to Cipro so she was switched to [place].  Patient states she has been having diarrhea since Friday.  She has had some night sweats and chills.  She did see some black stool on Saturday and Sunday but denies any currently.  She reports a history of C. difficile in the past.  Past Medical History:  Diagnosis Date  . Abdominal pain   . Allergy   . Cancer [facility]) 2017   breast cancer- Left  . Cataract   . CHF (congestive heart failure) ([place])   . Collagenous colitis   . Diabetes mellitus without complication ([place])   . Diarrhea   . Diverticulosis   . Fatty liver disease, nonalcoholic   . Fibrocystic breast   . GERD (gastroesophageal reflux disease)   . Heart murmur   . Hyperlipidemia   . Hypertension   . Hypothyroidism   . IBS (irritable bowel syndrome)   . IBS (irritable bowel syndrome)   . IDA (iron deficiency anemia)   . Personal history of radiation therapy   . PONV (postoperative nausea and vomiting)   . Sleep apnea    C-Pap    Patient Active Problem List   Diagnosis Date Noted  . Nausea vomiting and diarrhea 01/11/2017  . Hypothyroidism 01/11/2017  . HTN (hypertension) 01/11/2017  . Diabetes ([place]) 01/11/2017  . GERD (gastroesophageal reflux disease) 01/11/2017  . Other pancytopenia ([place]) 11/14/2016  . Monilial  vulvitis 11/06/2016  . 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 Status post hysterectomy 06/20/2016  . 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 Vaginal atrophy 01/12/2016  . 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 ABDOMINAL HYSTERECTOMY     tah bso  . ABDOMINAL SURGERY    . APPENDECTOMY    . BREAST BIOPSY Bilateral    negative  . 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 BREAST BIOPSY Left 05/27/2016   neg  . CARDIAC SURGERY    . CATARACT EXTRACTION Right   . CHOLECYSTECTOMY    . COLONOSCOPY WITH PROPOFOL N/A 03/11/2016   Procedure: COLONOSCOPY WITH PROPOFOL;  Surgeon: Manya Silvas, MD;  [facility] ENDOSCOPY;  Service: Endoscopy;  Laterality: N/A;  . ESOPHAGOGASTRODUODENOSCOPY (EGD) WITH PROPOFOL N/A 03/11/2016   Procedure: ESOPHAGOGASTRODUODENOSCOPY (EGD) WITH PROPOFOL;  Surgeon: Manya Silvas, MD;  Location: [facility] ENDOSCOPY;  Service: Endoscopy;  Laterality: N/A;  . JOINT REPLACEMENT Left    TKR  . left sinusplasty     . MASTECTOMY, PARTIAL Left 10/17/2015   Procedure: MASTECTOMY PARTIAL REVISION;  Surgeon: Leonie Green, MD;  Location: [facility] ORS;  Service: General;  Laterality: Left;  . PARTIAL MASTECTOMY WITH NEEDLE LOCALIZATION Left 09/29/2015   Procedure: PARTIAL MASTECTOMY WITH NEEDLE LOCALIZATION;  Surgeon: Leonie Green, MD;  Location:  [facility] ORS;  Service: General;  Laterality: Left;  . TOTAL ABDOMINAL HYSTERECTOMY W/ BILATERAL SALPINGOOPHORECTOMY      Allergies Fentanyl; Codeine; Demeclocycline; Demerol [meperidine]; Hydrocodone-acetaminophen; Other; Oxycodone; Pentazocine; and Tetracyclines & related  Social History Social History   Tobacco Use  . Smoking status: Never Smoker  . Smokeless tobacco: Never Used  Substance Use Topics  . Alcohol use: No    Alcohol/week: 0.0 oz  . Drug use: No   Review of  Systems Constitutional: Positive for chills and sweats Eyes: Negative for vision changes ENT:  Negative for congestion, sore throat Cardiovascular: Negative for chest pain. Respiratory: Negative for shortness of breath. Gastrointestinal: Positive for diarrhea Genitourinary: Negative for dysuria. Musculoskeletal: Negative for back pain. Skin: Negative for rash. Neurological: Negative for headaches, focal weakness or numbness.  All systems negative/normal/unremarkable except as stated in the HPI  ____________________________________________   PHYSICAL EXAM:  VITAL SIGNS: ED Triage Vitals  Enc Vitals Group     BP 05/13/17 1135 (!) 118/50     Pulse Rate 05/13/17 1135 63     Resp 05/13/17 1135 18     Temp 05/13/17 1135 97.9 F (36.6 C)     Temp Source 05/13/17 1135 Oral     SpO2 05/13/17 1135 98 %     Weight 05/13/17 1135 160 lb (72.6 kg)     Height 05/13/17 1135 5\' 3"  (1.6 m)     Head Circumference --      Peak Flow --      Pain Score 05/13/17 1146 5     Pain Loc --      Pain Edu? --      Excl. in [place]? --    Constitutional: Alert and oriented. Well appearing and in no distress. Eyes: Conjunctivae are normal. Normal extraocular movements. ENT   Head: Normocephalic and atraumatic.   Nose: No congestion/rhinnorhea.   Mouth/Throat: Mucous membranes are moist.   Neck: No stridor. Cardiovascular: Normal rate, regular rhythm. No murmurs, rubs, or gallops. Respiratory: Normal respiratory effort without tachypnea nor retractions. Breath sounds are clear and equal bilaterally. No wheezes/rales/rhonchi. Gastrointestinal: Soft and nontender. Normal bowel sounds Musculoskeletal: Nontender with normal range of motion in extremities. No lower extremity tenderness nor edema. Neurologic:  Normal speech and language. No gross focal neurologic deficits are appreciated.  Skin:  Skin is warm, dry and intact. No rash noted. Psychiatric: Mood and affect are normal. Speech and  behavior are normal.  ____________________________________________  ED COURSE:  As part of my medical decision making, I reviewed the following data within the [place] History obtained from family if available, nursing notes, old chart and ekg, as well as notes from prior ED visits. Patient presented for diarrhea and recent UTI, we will assess with labs and imaging as indicated at this time.   Procedures ____________________________________________   LABS (pertinent positives/negatives)  Labs Reviewed  LIPASE, BLOOD - Abnormal; Notable for the following components:      Result Value   Lipase 144 (*)    All other components within normal limits  COMPREHENSIVE METABOLIC PANEL - Abnormal; Notable for the following components:   Potassium 3.0 (*)    Glucose, Bld 244 (*)    Calcium 8.3 (*)    Albumin 3.3 (*)    All other components within normal limits  CBC - Abnormal; Notable for the following components:   RBC 3.52 (*)    Hemoglobin 10.4 (*)    HCT 31.2 (*)    RDW 14.8 (*)  Platelets 126 (*)    All other components within normal limits  URINALYSIS, COMPLETE (UACMP) WITH MICROSCOPIC - Abnormal; Notable for the following components:   Color, Urine AMBER (*)    APPearance CLEAR (*)    Glucose, UA   (*)    Value: TEST NOT REPORTED DUE TO COLOR INTERFERENCE OF URINE PIGMENT   Hgb urine dipstick   (*)    Value: TEST NOT REPORTED DUE TO COLOR INTERFERENCE OF URINE PIGMENT   Bilirubin Urine   (*)    Value: TEST NOT REPORTED DUE TO COLOR INTERFERENCE OF URINE PIGMENT   Ketones, ur   (*)    Value: TEST NOT REPORTED DUE TO COLOR INTERFERENCE OF URINE PIGMENT   Protein, ur   (*)    Value: TEST NOT REPORTED DUE TO COLOR INTERFERENCE OF URINE PIGMENT   Nitrite   (*)    Value: TEST NOT REPORTED DUE TO COLOR INTERFERENCE OF URINE PIGMENT   Leukocytes, UA   (*)    Value: TEST NOT REPORTED DUE TO COLOR INTERFERENCE OF URINE PIGMENT   Bacteria, UA RARE (*)    Squamous  Epithelial / LPF 0-5 (*)    All other components within normal limits  GASTROINTESTINAL PANEL BY PCR, STOOL (REPLACES STOOL CULTURE)  C DIFFICILE QUICK SCREEN W PCR REFLEX  ____________________________________________  DIFFERENTIAL DIAGNOSIS   Viral versus bacterial enteritis, colitis, dehydration, electrolyte abnormality, UTI  FINAL ASSESSMENT AND PLAN  Diarrhea   Plan: The patient had presented for persistent diarrhea after recently being treated for UTI. Patient's labs do reveal persistent UTI but otherwise grossly unremarkable lab work compared to prior.  Patient was able to give [place] stool samples and final disposition is pending at this time.   Laurence Aly, MD   Note: This note was generated in part or whole with voice recognition software. Voice recognition is usually quite accurate but there are transcription errors that can and very often do occur. I apologize for any typographical errors that were not detected and corrected.     Earleen Newport, MD 05/13/17 [phone]

## 2017-05-13 NOTE — ED Triage Notes (Signed)
Pt reports last Monday she was seen by her PCP for uti, placed on cipro. Pt was called Thursday and told that her culture came back and was switched to macrobid. Pt states that she began having diarrhea Friday night. Reports night chills and sweats.

## 2017-05-19 ENCOUNTER — Other Ambulatory Visit: Payer: Self-pay | Admitting: Gastroenterology

## 2017-05-19 DIAGNOSIS — R112 Nausea with vomiting, unspecified: Secondary | ICD-10-CM

## 2017-05-19 DIAGNOSIS — R109 Unspecified abdominal pain: Secondary | ICD-10-CM

## 2017-05-19 DIAGNOSIS — R748 Abnormal levels of other serum enzymes: Secondary | ICD-10-CM

## 2017-05-20 ENCOUNTER — Ambulatory Visit
Admission: RE | Admit: 2017-05-20 | Discharge: 2017-05-20 | Disposition: A | Discharge status: Home / Self Care | Payer: Medicare Other | Source: Ambulatory Visit | Attending: Gastroenterology | Admitting: Gastroenterology

## 2017-05-20 DIAGNOSIS — R1084 Generalized abdominal pain: Secondary | ICD-10-CM | POA: Diagnosis not present

## 2017-05-20 DIAGNOSIS — R161 Splenomegaly, not elsewhere classified: Secondary | ICD-10-CM | POA: Insufficient documentation

## 2017-05-20 DIAGNOSIS — R748 Abnormal levels of other serum enzymes: Secondary | ICD-10-CM | POA: Diagnosis not present

## 2017-05-20 DIAGNOSIS — J90 Pleural effusion, not elsewhere classified: Secondary | ICD-10-CM | POA: Diagnosis not present

## 2017-05-20 DIAGNOSIS — K5730 Diverticulosis of large intestine without perforation or abscess without bleeding: Secondary | ICD-10-CM | POA: Insufficient documentation

## 2017-05-20 DIAGNOSIS — R932 Abnormal findings on diagnostic imaging of liver and biliary tract: Secondary | ICD-10-CM | POA: Insufficient documentation

## 2017-05-20 DIAGNOSIS — R109 Unspecified abdominal pain: Secondary | ICD-10-CM

## 2017-05-20 DIAGNOSIS — I700 Atherosclerosis of aorta: Secondary | ICD-10-CM | POA: Diagnosis not present

## 2017-05-20 DIAGNOSIS — R188 Other ascites: Secondary | ICD-10-CM | POA: Diagnosis not present

## 2017-05-20 DIAGNOSIS — R93422 Abnormal radiologic findings on diagnostic imaging of left kidney: Secondary | ICD-10-CM | POA: Diagnosis not present

## 2017-05-20 DIAGNOSIS — R112 Nausea with vomiting, unspecified: Secondary | ICD-10-CM | POA: Diagnosis present

## 2017-05-20 MED ORDER — IOHEXOL 300 MG/ML  SOLN
100.0000 mL | Freq: Once | INTRAMUSCULAR | Status: AC | PRN
  Administered 2017-05-20: 100 mL via INTRAVENOUS

## 2017-05-22 ENCOUNTER — Ambulatory Visit
Admission: RE | Admit: 2017-05-22 | Discharge: 2017-05-22 | Disposition: A | Discharge status: Home / Self Care | Payer: Medicare Other | Source: Ambulatory Visit | Attending: Internal Medicine | Admitting: Internal Medicine

## 2017-05-22 ENCOUNTER — Other Ambulatory Visit: Payer: Self-pay | Admitting: Internal Medicine

## 2017-05-22 ENCOUNTER — Ambulatory Visit: Payer: Medicare Other | Admitting: Podiatry

## 2017-05-22 DIAGNOSIS — M1611 Unilateral primary osteoarthritis, right hip: Secondary | ICD-10-CM | POA: Diagnosis not present

## 2017-05-22 DIAGNOSIS — M25551 Pain in right hip: Secondary | ICD-10-CM | POA: Diagnosis present

## 2017-05-23 ENCOUNTER — Other Ambulatory Visit: Payer: Self-pay | Admitting: Gastroenterology

## 2017-05-23 DIAGNOSIS — R935 Abnormal findings on diagnostic imaging of other abdominal regions, including retroperitoneum: Secondary | ICD-10-CM

## 2017-05-28 ENCOUNTER — Inpatient Hospital Stay (HOSPITAL_BASED_OUTPATIENT_CLINIC_OR_DEPARTMENT_OTHER): Payer: Medicare Other | Admitting: Internal Medicine

## 2017-05-28 ENCOUNTER — Inpatient Hospital Stay: Payer: Medicare Other | Attending: Internal Medicine

## 2017-05-28 VITALS — BP 128/61 | HR 69 | Temp 97.9°F | Resp 18 | Ht 63.0 in | Wt 163.0 lb

## 2017-05-28 DIAGNOSIS — E039 Hypothyroidism, unspecified: Secondary | ICD-10-CM | POA: Diagnosis not present

## 2017-05-28 DIAGNOSIS — D0512 Intraductal carcinoma in situ of left breast: Secondary | ICD-10-CM | POA: Insufficient documentation

## 2017-05-28 DIAGNOSIS — D696 Thrombocytopenia, unspecified: Secondary | ICD-10-CM

## 2017-05-28 DIAGNOSIS — N390 Urinary tract infection, site not specified: Secondary | ICD-10-CM | POA: Insufficient documentation

## 2017-05-28 DIAGNOSIS — D61818 Other pancytopenia: Secondary | ICD-10-CM

## 2017-05-28 LAB — CBC WITH DIFFERENTIAL/PLATELET
Basophils Absolute: 0 10*3/uL (ref 0–0.1)
Basophils Relative: 1 %
Eosinophils Absolute: 0.1 10*3/uL (ref 0–0.7)
Eosinophils Relative: 1 %
HCT: 31.8 % — ABNORMAL LOW (ref 35.0–47.0)
Hemoglobin: 10.8 g/dL — ABNORMAL LOW (ref 12.0–16.0)
Lymphocytes Relative: 44 %
Lymphs Abs: 1.8 10*3/uL (ref 1.0–3.6)
MCH: 30.7 pg (ref 26.0–34.0)
MCHC: 34 g/dL (ref 32.0–36.0)
MCV: 90.2 fL (ref 80.0–100.0)
Monocytes Absolute: 0.3 10*3/uL (ref 0.2–0.9)
Monocytes Relative: 8 %
Neutro Abs: 1.9 10*3/uL (ref 1.4–6.5)
Neutrophils Relative %: 46 %
Platelets: 143 10*3/uL — ABNORMAL LOW (ref 150–440)
RBC: 3.53 MIL/uL — ABNORMAL LOW (ref 3.80–5.20)
RDW: 16 % — ABNORMAL HIGH (ref 11.5–14.5)
WBC: 4.1 10*3/uL (ref 3.6–11.0)

## 2017-05-28 LAB — LACTATE DEHYDROGENASE: LDH: 161 U/L (ref 98–192)

## 2017-05-28 LAB — COMPREHENSIVE METABOLIC PANEL
ALT: 17 U/L (ref 14–54)
AST: 27 U/L (ref 15–41)
Albumin: 3.5 g/dL (ref 3.5–5.0)
Alkaline Phosphatase: 64 U/L (ref 38–126)
Anion gap: 8 (ref 5–15)
BUN: 13 mg/dL (ref 6–20)
CO2: 25 mmol/L (ref 22–32)
Calcium: 9.2 mg/dL (ref 8.9–10.3)
Chloride: 100 mmol/L — ABNORMAL LOW (ref 101–111)
Creatinine, Ser: 0.93 mg/dL (ref 0.44–1.00)
GFR calc Af Amer: >60 mL/min (ref >60)
GFR calc non Af Amer: 58 mL/min — ABNORMAL LOW (ref >60)
Glucose, Bld: 216 mg/dL — ABNORMAL HIGH (ref 65–99)
Potassium: 3.7 mmol/L (ref 3.5–5.1)
Sodium: 133 mmol/L — ABNORMAL LOW (ref 135–145)
Total Bilirubin: 0.6 mg/dL (ref 0.3–1.2)
Total Protein: 8.6 g/dL — ABNORMAL HIGH (ref 6.5–8.1)

## 2017-05-28 NOTE — Assessment & Plan Note (Addendum)
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  Continue to hold letrozole given multiple other side effects/intolerance [hot flashes/night sweats/feeling poorly].   # Right  LE cramps; s/p trauma; if not better- [place] legs; check Mag [on PO mag] .  No concerns for DVT.  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 [White count-2.7 ANC- 1.3; hemoglobin 11 platelets 101]- slightly worse from previous.  Bone marrow biopsy-no obvious evidence of MDS; NGS/foundation 1-declined by insurance.  Suspect likely from underlying cirrhosis/splenomegaly.  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  Followed by GI.  #Recent UTI/Pyelonephritis-on antibiotics; question perfusion defect noted on the kidney on the CT scan.  Question infection versus malignancy.  Patient will likely need repeat imaging after finishing antibiotics.  #Elevated lipase; less likely from acute pancreatitis as no pancreatic inflammation noted on the CT scan.  # follow up in 3 months/labs; check Mag today. Re-starting letrozle at that time.   Dr.Tate/GI doc

## 2017-05-28 NOTE — Progress Notes (Signed)
Pt reports recent fall approx. 2 weeks ago on 4/2. bruising noted on right leg.  Pt was recently dx with UTI and treated with monurol

## 2017-05-28 NOTE — Progress Notes (Signed)
[facility] NOTE  Patient Care Team: Albina Billet, MD as PCP - General (Internal Medicine)  CHIEF COMPLAINTS/PURPOSE OF CONSULTATION:   Oncology History   # AUG 2017- DCIS LEFT BREAST ER/PR- POS; positive margins [Dr.Smith]; s/p re-excision; s/p RT [finish RT Nov 10th 2017]; START ARIMIDEX- Jan 2018; Stopped in July 2018- sec to muscle cramps; Sep 2018- start Letrozole.;  JAN 2019-STOPPED Letrozole sec night sweats"  # Mild pancytopenia [CTApril 2019-cirrhosis/spleneomegaly]. OCT 2018- BMBx- MILD dyspoiesis; variable cellularity [10-50%]; FISH/Cytogenetics-NORMAL; F-One-NGS-declined by insurance.    # cirrhosis- ? etiology  # HRT [clinical trial thru [facility]; stopped July 2017 ]; CPAP      Ductal carcinoma in situ (DCIS) of left breast     Oncology History   # AUG 2017- DCIS LEFT BREAST ER/PR- POS; positive margins [Dr.Smith]; s/p re-excision; s/p RT [finish RT Nov 10th 2017]; START ARIMIDEX- Jan 2018; Stopped in July 2018- sec to muscle cramps; Sep 2018- start Letrozole.;  JAN 2019-STOPPED Letrozole sec night sweats"  # Mild pancytopenia [CTApril 2019-cirrhosis/spleneomegaly]. OCT 2018- BMBx- MILD dyspoiesis; variable cellularity [10-50%]; FISH/Cytogenetics-NORMAL; F-One-NGS-declined by insurance.    # cirrhosis- ? etiology  # HRT [clinical trial thru [facility]; stopped July 2017 ]; CPAP      Ductal carcinoma in situ (DCIS) of left breast     HISTORY OF PRESENTING ILLNESS:  Annette Foster 77 y.o.  female pleasant patient with left breast DCIS; And also history of pancytopenia-of unclear etiology is here for follow-up.  Patient has been been feeling poorly-had a recent episode of UTI for which she is on antibiotics.  She also had nausea vomiting diarrhea-which led to a CT scan of the abdomen pelvis.  No acute findings noted except for perfusion defect in the kidney.  Given elevated lipase-there was some concerns of possible acute pancreatitis.  Patient currently  does not complain of abdominal pain.  No fevers or chills.  Complains of cramping in the legs.  ROS: A complete 10 point review of system is done which is negative except mentioned above in history of present illness  MEDICAL HISTORY:  Past Medical History:  Diagnosis Date  . Abdominal pain   . Allergy   . Cancer [facility]) 2017   breast cancer- Left  . Cataract   . CHF (congestive heart failure) ([place])   . Collagenous colitis   . Diabetes mellitus without complication ([place])   . Diarrhea   . Diverticulosis   . Fatty liver disease, nonalcoholic   . Fibrocystic breast   . GERD (gastroesophageal reflux disease)   . Heart murmur   . Hyperlipidemia   . Hypertension   . Hypothyroidism   . IBS (irritable bowel syndrome)   . IBS (irritable bowel syndrome)   . IDA (iron deficiency anemia)   . Personal history of radiation therapy   . PONV (postoperative nausea and vomiting)   . Sleep apnea    C-Pap    SURGICAL HISTORY: Past Surgical History:  Procedure Laterality Date  . ABDOMINAL HYSTERECTOMY     [place] b[place]  . ABDOMINAL SURGERY    . APPENDECTOMY    . BREAST BIOPSY Bilateral    negative  . 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 BREAST BIOPSY Left 05/27/2016   neg  . CARDIAC SURGERY    . CATARACT EXTRACTION Right   . CHOLECYSTECTOMY    . COLONOSCOPY WITH PROPOFOL N/A 03/11/2016   Procedure: COLONOSCOPY WITH PROPOFOL;  Surgeon: Manya Silvas,  MD;  Location: [facility] ENDOSCOPY;  Service: Endoscopy;  Laterality: N/A;  . ESOPHAGOGASTRODUODENOSCOPY (EGD) WITH PROPOFOL N/A 03/11/2016   Procedure: ESOPHAGOGASTRODUODENOSCOPY (EGD) WITH PROPOFOL;  Surgeon: Manya Silvas, MD;  Location: [facility] ENDOSCOPY;  Service: Endoscopy;  Laterality: N/A;  . JOINT REPLACEMENT Left    TKR  . left sinusplasty     . MASTECTOMY, PARTIAL Left 10/17/2015   Procedure: MASTECTOMY PARTIAL REVISION;  Surgeon: Leonie Green, MD;  Location: [facility] ORS;  Service: General;  Laterality:  Left;  . PARTIAL MASTECTOMY WITH NEEDLE LOCALIZATION Left 09/29/2015   Procedure: PARTIAL MASTECTOMY WITH NEEDLE LOCALIZATION;  Surgeon: Leonie Green, MD;  Location: [facility] ORS;  Service: General;  Laterality: Left;  . 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  Social History   Socioeconomic History  . Marital status: Married    Spouse name: Not on file  . Number of children: Not on file  . Years of education: Not on file  . Highest education level: Not on file  Occupational History  . Not on file  Social Needs  . Financial resource strain: Not on file  . Food insecurity:    Worry: Not on file    Inability: Not on file  . Transportation needs:    Medical: Not on file    Non-medical: Not on file  Tobacco Use  . Smoking status: Never Smoker  . Smokeless tobacco: Never Used  Substance and Sexual Activity  . Alcohol use: No    Alcohol/week: 0.0 oz  . Drug use: No  . Sexual activity: Not Currently    Birth control/protection: Surgical  Lifestyle  . Physical activity:    Days per week: Not on file    Minutes per session: Not on file  . Stress: Not on file  Relationships  . Social connections:    Talks on phone: Not on file    Gets together: Not on file    Attends religious service: Not on file    Active member of club or organization: Not on file    Attends meetings of clubs or organizations: Not on file    Relationship status: Not on file  . Intimate partner violence:    Fear of current or ex partner: Not on file    Emotionally abused: Not on file    Physically abused: Not on file    Forced sexual activity: Not on file  Other Topics Concern  . Not on file  Social History Narrative  . Not on file    FAMILY HISTORY: Family History  Problem Relation Age of Onset  . Breast cancer Paternal Grandmother   . Colon cancer Father   . Diabetes Sister   . Diabetes  Brother   . Heart disease Brother   . Prostate cancer Brother   . Colon cancer Maternal Uncle   . Prostate cancer Brother   . Bladder Cancer Brother   . Ovarian cancer Neg Hx   . Kidney cancer Neg Hx     ALLERGIES:  is allergic to fentanyl; codeine; demeclocycline; demerol [meperidine]; hydrocodone-acetaminophen; other; oxycodone; pentazocine; and tetracyclines & related.  MEDICATIONS:  Current Outpatient Medications  Medication Sig Dispense Refill  . aspirin 81 MG tablet Take 81 mg by mouth at bedtime.    . budesonide-formoterol (SYMBICORT) 80-4.5 MCG/ACT inhaler Inhale 2 puffs into the lungs 2 (two) times daily as needed (shortness of breath).     Marland Kitchen  cholecalciferol (VITAMIN D) 1000 units tablet Take 2,000 Units by mouth daily.     . Cyanocobalamin (B-12 COMPLIANCE INJECTION) 1000 MCG/ML KIT Inject 1 Syringe as directed every 30 (thirty) days.    Marland Kitchen escitalopram (LEXAPRO) 5 MG tablet Take 5 mg by mouth at bedtime.    Marland Kitchen esomeprazole (NEXIUM) 40 MG capsule Take 40 mg by mouth daily at 12 noon.     . fenofibrate 160 MG tablet Take 160 mg by mouth at bedtime.    . fexofenadine (ALLEGRA) 180 MG tablet Take 180 mg by mouth at bedtime.     . fluticasone (FLONASE) 50 MCG/ACT nasal spray Place 1 spray into the nose daily as needed for allergies.     . furosemide (LASIX) 20 MG tablet Take 20-40 mg by mouth every other day. Alternate between 1 a day and 2 a day    . gabapentin (NEURONTIN) 300 MG capsule Take 300 mg by mouth daily.    Marland Kitchen glimepiride (AMARYL) 2 MG tablet Take 2 mg by mouth at bedtime.    . hydrOXYzine (ATARAX/VISTARIL) 25 MG tablet 1 or 2 tablets at night for itching and sleep 30 tablet 2  . Hyoscyamine Sulfate 0.375 MG TBCR Take 0.375 mg by mouth 2 (two) times daily.     . Insulin Degludec (TRESIBA FLEXTOUCH [place]) Inject 10 Units into the skin at bedtime.    Marland Kitchen levothyroxine (SYNTHROID, LEVOTHROID) 75 MCG tablet Take 75 mcg by mouth daily before breakfast.     . magnesium oxide  (MAG-OX) 400 MG tablet Take by mouth.    . mirabegron ER (MYRBETRIQ) 50 MG TB24 tablet Take 1 tablet (50 mg total) by mouth daily. 30 tablet 3  . MISC NATURAL PRODUCTS PO Take by mouth.    . montelukast (SINGULAIR) 10 MG tablet Take 10 mg by mouth at bedtime.     Marland Kitchen olopatadine (PATANOL) 0.1 % ophthalmic solution 1 drop 2 (two) times daily.    . ondansetron (ZOFRAN) 4 MG tablet Take 4 mg by mouth every 6 (six) hours as needed. for nausea  1  . potassium chloride SA (K-DUR,KLOR-CON) 20 MEQ tablet Take by mouth.    . saccharomyces boulardii (FLORASTOR) 250 MG capsule Take 250 mg by mouth 2 (two) times daily.     . simvastatin (ZOCOR) 20 MG tablet Take 20 mg by mouth at bedtime.    . valACYclovir (VALTREX) 500 MG tablet Take 500 mg by mouth 2 (two) times daily.  1  . valsartan (DIOVAN) 160 MG tablet Take 160 mg by mouth every morning.      No current facility-administered medications for this visit.       Marland Kitchen  PHYSICAL EXAMINATION: ECOG PERFORMANCE STATUS: 0 - Asymptomatic  Vitals:   05/28/17 1515  BP: 128/61  Pulse: 69  Resp: 18  Temp: 97.9 F (36.6 C)   Filed Weights   05/28/17 1523  Weight: 163 lb (73.9 kg)    GENERAL: Well-nourished well-developed; Alert, no distress and comfortable.  Alone.  EYES: no pallor or icterus OROPHARYNX: no thrush or ulceration; good dentition  NECK: supple, no masses felt LYMPH:  no palpable lymphadenopathy in the cervical, axillary or inguinal regions LUNGS: clear to auscultation and  No wheeze or crackles HEART/CVS: regular rate & rhythm and no murmurs; No lower extremity edema ABDOMEN: abdomen soft, tender in LLQ with normal bowel sounds Musculoskeletal:no cyanosis of digits and no clubbing  PSYCH: alert & oriented x 3 with fluent speech NEURO: no focal motor/sensory deficits  SKIN:  no rashes or significant lesions  LABORATORY DATA:  I have reviewed the data as listed Lab Results  Component Value Date   WBC 4.1 05/28/2017   HGB 10.8 (L)  05/28/2017   HCT 31.8 (L) 05/28/2017   MCV 90.2 05/28/2017   PLT 143 (L) 05/28/2017   Recent Labs    01/10/17 1347  01/12/17 1104 05/13/17 1137 05/28/17 1430  NA 134*   < > 137 136 133*  K 3.4*   < > 3.7 3.0* 3.7  CL 98*   < > 104 101 100*  CO2 26   < > _0 GLUCOSE 150*   < > 208* 244* 216*  BUN 17   < > _1 CREATININE 0.67   < > 0.66 0.77 0.93  CALCIUM 8.5*   < > 8.5* 8.3* 9.2  GFRNONAA >60   < > >60 >60 58*  GFRAA >60   < > >60 >60 >60  PROT 6.9  --   --  7.1 8.6*  ALBUMIN 3.0*  --   --  3.3* 3.5  AST 47*  --   --  38 27  ALT 28  --   --  28 17  ALKPHOS 75  --   --  67 64  BILITOT 0.7  --   --  1.1 0.6   < > = values in this interval not displayed.    RADIOGRAPHIC STUDIES: I have personally reviewed the radiological images as listed and agreed with the findings in the report. Ct Abdomen Pelvis W Contrast  Result Date: 05/21/2017 CLINICAL DATA:  77 year old female with history of nausea, vomiting and diarrhea for the past 3 weeks. Recent history of pancreatitis. Right lower quadrant pain for 3 months. History of left-sided breast cancer status post radiation therapy and surgery. EXAM: CT ABDOMEN AND PELVIS WITH CONTRAST TECHNIQUE: Multidetector CT imaging of the abdomen and pelvis was performed using the standard protocol following bolus administration of intravenous contrast. CONTRAST:  118m OMNIPAQUE IOHEXOL 300 MG/ML  SOLN COMPARISON:  CT the abdomen and pelvis 01/10/2017. FINDINGS: Lower chest: Small right and trace left pleural effusions lying dependently. Hepatobiliary: Liver has a slightly shrunken appearance and nodular contour, indicative of early changes of cirrhosis. No suspicious cystic or solid hepatic lesions. No intra or extrahepatic biliary ductal dilatation. Status post cholecystectomy. Pancreas: No pancreatic mass. No pancreatic ductal dilatation. No pancreatic or peripancreatic fluid or inflammatory changes. Spleen: Spleen is enlarged measuring 6.4 x  16.8 x 14.3 cm (estimated splenic volume of 769 mL). Adrenals/Urinary Tract: Subtle area of hypoperfusion involving the upper pole of the left kidney measuring approximately 2.0 x 2.6 x 2.9 cm (axial image 11 of series 9 and coronal image 124 of series 4), of uncertain etiology but potentially a perfusion defect related to pyelonephritis, although an underlying mass is difficult to exclude. Right kidney and bilateral adrenal glands are normal in appearance. No hydroureteronephrosis. Urinary bladder is normal in appearance. Stomach/Bowel: Stomach is normal in appearance. No pathologic dilatation of small bowel or colon. Appendix is not confidently identified. A few scattered colonic diverticulae are noted, most evident in the sigmoid colon, without definite surrounding inflammatory changes to suggest an acute diverticulitis at this time. Vascular/Lymphatic: Aortic atherosclerosis, without evidence of aneurysm or dissection in the abdominal or pelvic vasculature. 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 Several prominent borderline to mildly enlarged retroperitoneal lymph nodes are noted, which are nonspecific but appear very similar  to prior study 01/10/2017, with the largest of these lymph nodes measuring up to 1 cm in short axis in the left para-aortic nodal station (axial image 33 of series 2). Reproductive: Status post hysterectomy. Ovaries are not confidently identified may be surgically absent or atrophic. Other: Small volume of ascites most evident adjacent to the liver and spleen. No pneumoperitoneum. Musculoskeletal: Several areas of lucency with vertical trabeculations are again noted throughout the vertebral bodies of the visualized thoracolumbar spine, compatible with multiple cavernous hemangiomas. No other definite aggressive appearing lytic or blastic lesions are noted elsewhere in the visualized portions of the skeleton. IMPRESSION: 1. Subtle perfusion abnormality in the upper pole  of the left kidney. This is of uncertain etiology and significance. Differential considerations include either a true perfusion abnormality related to an acute process such as pyelonephritis, or an underlying lesion which could either be a primary renal lesion or metastatic. Further clinical evaluation is recommended. If there are no signs or symptoms suggestive of pyelonephritis, further evaluation with MRI of the abdomen with and without IV gadolinium would be recommended in the near future to better characterize this region. 2. No other definite acute findings are noted in the abdomen or pelvis to account for the patient's symptoms. 3. There are changes in the liver indicative of cirrhosis. This is associated with dilatation of the portal vein (16 mm in diameter) and splenomegaly, indicative of portal venous hypertension. As well, there is a small volume of ascites. 4. Mild colonic diverticulosis without definite findings to suggest an acute diverticulitis at this time. 5. Small right and trace left pleural effusions lying dependently. 6. Aortic atherosclerosis. Aortic Atherosclerosis (ICD10-I70.0). Electronically Signed   By: Vinnie Langton M.D.   On: 05/21/2017 09:48   Dg Hip Unilat With Pelvis 2-3 Views Right  Result Date: 05/22/2017 CLINICAL DATA:  [place] 6 days ago with right hip pain, vomiting, history of leukemia EXAM: DG HIP (WITH OR WITHOUT PELVIS) 2-3V RIGHT COMPARISON:  CT abdomen pelvis of 05/20/2017 FINDINGS: There is mild degenerative joint disease of the hips with some loss of joint space and spurring present. No acute fracture is seen. The bones are somewhat osteopenic. The pelvic rami are intact. The SI joints are corticated. Surgical clips are noted in the mid pelvis. Some residual contrast is present in the rectosigmoid colon. IMPRESSION: 1. No acute fracture. 2. 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 Electronically Signed   By: Ivar Drape M.D.   On: 05/22/2017 16:38    IMPRESSION: 1. Subtle perfusion abnormality in the upper pole of the left kidney. This is of uncertain etiology and significance. Differential considerations include either a true perfusion abnormality related to an acute process such as pyelonephritis, or an underlying lesion which could either be a primary renal lesion or metastatic. Further clinical evaluation is recommended. If there are no signs or symptoms suggestive of pyelonephritis, further evaluation with MRI of the abdomen with and without IV gadolinium would be recommended in the near future to better characterize this region. 2. No other definite acute findings are noted in the abdomen or pelvis to account for the patient's symptoms. 3. There are changes in the liver indicative of cirrhosis. This is associated with dilatation of the portal vein (16 mm in diameter) and splenomegaly, indicative of portal venous hypertension. As well, there is a small volume of ascites. 4. Mild colonic diverticulosis without definite findings to suggest an acute diverticulitis at this time. 5. Small right and trace left pleural effusions lying  dependently. 6. Aortic atherosclerosis.  Aortic Atherosclerosis (ICD10-I70.0).   Electronically Signed   By: Vinnie Langton M.D.   On: 05/21/2017 09:48  ASSESSMENT & PLAN:   Ductal carcinoma in situ (DCIS) of left breast # # DCIS/papillary carcinoma in situ-  left breast status post RE- excision on adjuvant RT [finish 10th Nov 2017].  Continue to hold letrozole given multiple other side effects/intolerance [hot flashes/night sweats/feeling poorly].   # Right  LE cramps; s/p trauma; if not better- [place] legs; check Mag [on PO mag] .  No concerns for DVT.  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 [White count-2.7 ANC- 1.3; hemoglobin 11 platelets 101]- slightly worse from previous.  Bone marrow biopsy-no obvious evidence of MDS; NGS/foundation 1-declined by insurance.  Suspect  likely from underlying cirrhosis/splenomegaly.  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  Followed by GI.  #Recent UTI/Pyelonephritis-on antibiotics; question perfusion defect noted on the kidney on the CT scan.  Question infection versus malignancy.  Patient will likely need repeat imaging after finishing antibiotics.  #Elevated lipase; less likely from acute pancreatitis as no pancreatic inflammation noted on the CT scan.  # follow up in 3 months/labs; check Mag today. Re-starting letrozle at that time.   Dr.Tate/GI doc     Cammie Sickle, MD 06/03/2017 12:56 PM

## 2017-06-05 ENCOUNTER — Other Ambulatory Visit: Payer: Self-pay | Admitting: Gastroenterology

## 2017-06-05 DIAGNOSIS — R935 Abnormal findings on diagnostic imaging of other abdominal regions, including retroperitoneum: Secondary | ICD-10-CM

## 2017-06-06 ENCOUNTER — Ambulatory Visit
Admission: RE | Admit: 2017-06-06 | Discharge: 2017-06-06 | Disposition: A | Discharge status: Home / Self Care | Payer: Medicare Other | Source: Ambulatory Visit | Attending: Gastroenterology | Admitting: Gastroenterology

## 2017-06-06 DIAGNOSIS — K7460 Unspecified cirrhosis of liver: Secondary | ICD-10-CM | POA: Insufficient documentation

## 2017-06-06 DIAGNOSIS — R935 Abnormal findings on diagnostic imaging of other abdominal regions, including retroperitoneum: Secondary | ICD-10-CM

## 2017-06-06 DIAGNOSIS — Z9049 Acquired absence of other specified parts of digestive tract: Secondary | ICD-10-CM | POA: Diagnosis not present

## 2017-06-06 DIAGNOSIS — K766 Portal hypertension: Secondary | ICD-10-CM | POA: Diagnosis not present

## 2017-06-06 DIAGNOSIS — K8689 Other specified diseases of pancreas: Secondary | ICD-10-CM | POA: Insufficient documentation

## 2017-06-06 MED ORDER — GADOBENATE DIMEGLUMINE 529 MG/ML IV SOLN
15.0000 mL | Freq: Once | INTRAVENOUS | Status: AC | PRN
  Administered 2017-06-06: 15 mL via INTRAVENOUS

## 2017-06-12 ENCOUNTER — Ambulatory Visit: Payer: Medicare Other

## 2017-06-12 DIAGNOSIS — N390 Urinary tract infection, site not specified: Secondary | ICD-10-CM

## 2017-06-12 LAB — URINALYSIS, COMPLETE
Bilirubin, UA: NEGATIVE
Glucose, UA: NEGATIVE
Ketones, UA: NEGATIVE
Nitrite, UA: POSITIVE — AB
RBC, UA: NEGATIVE
Specific Gravity, UA: 1.01 (ref 1.005–1.030)
Urobilinogen, Ur: 1 mg/dL (ref 0.2–1.0)
pH, UA: 6.5 (ref 5.0–7.5)

## 2017-06-12 LAB — MICROSCOPIC EXAMINATION: RBC, UA: NONE SEEN /hpf (ref 0–2)

## 2017-06-12 MED ORDER — NITROFURANTOIN MONOHYD MACRO 100 MG PO CAPS: 100.0000 mg | ORAL_CAPSULE | Freq: Two times a day (BID) | ORAL | 0 refills | Status: AC

## 2017-06-12 NOTE — Progress Notes (Signed)
Pt present today for urinary frequency, postpone urination, burning/painful urination, leakage of urination, urinary urgency, and back/flank pain. Pt provided a urine sample for analysis and culture. Per Dr. Erlene Quan will treat pt with Macrobid bid for 10 days. Will wait for culture results and contact pt.

## 2017-06-14 LAB — CULTURE, URINE COMPREHENSIVE

## 2017-06-19 ENCOUNTER — Encounter: Payer: Self-pay | Admitting: Podiatry

## 2017-06-19 ENCOUNTER — Ambulatory Visit (INDEPENDENT_AMBULATORY_CARE_PROVIDER_SITE_OTHER): Payer: Medicare Other | Admitting: Podiatry

## 2017-06-19 DIAGNOSIS — M79674 Pain in right toe(s): Secondary | ICD-10-CM | POA: Diagnosis not present

## 2017-06-19 DIAGNOSIS — B351 Tinea unguium: Secondary | ICD-10-CM

## 2017-06-19 DIAGNOSIS — M79675 Pain in left toe(s): Secondary | ICD-10-CM | POA: Diagnosis not present

## 2017-06-19 DIAGNOSIS — E1142 Type 2 diabetes mellitus with diabetic polyneuropathy: Secondary | ICD-10-CM | POA: Diagnosis not present

## 2017-06-19 NOTE — Progress Notes (Signed)
Complaint:  Visit Type: Patient returns to my office for continued preventative foot care services. Complaint: Patient states" my nails have grown long and thick and become painful to walk and wear shoes" Patient has been diagnosed with DM with no foot complications. The patient presents for preventative foot care services. No changes to ROS  Podiatric Exam: Vascular: dorsalis pedis and posterior tibial pulses are palpable bilateral. Capillary return is immediate. Temperature gradient is WNL. Skin turgor WNL  Sensorium: Normal Semmes Weinstein monofilament test. Normal tactile sensation bilaterally. Nail Exam: Pt has thick disfigured discolored nails with subungual debris noted bilateral entire nail hallux through fifth toenails.  Right hallux nail plate is absent.  Surgery has healed. Ulcer Exam: There is no evidence of ulcer or pre-ulcerative changes or infection. Orthopedic Exam: Muscle tone and strength are WNL. No limitations in general ROM. No crepitus or effusions noted. Foot type and digits show no abnormalities. Bony prominences are unremarkable. Skin: No Porokeratosis. No infection or ulcers  Diagnosis:  Onychomycosis, , Pain in right toe, pain in left toes  Treatment & Plan Procedures and Treatment: Consent by patient was obtained for treatment procedures. The patient understood the discussion of treatment and procedures well. All questions were answered thoroughly reviewed. Debridement of mycotic and hypertrophic toenails, 1 through 5 bilateral and clearing of subungual debris. No ulceration, no infection noted.   Return Visit-Office Procedure: Patient instructed to return to the office for a follow up visit 4 months for continued evaluation and treatment.    Glendy Barsanti DPM 

## 2017-07-08 ENCOUNTER — Telehealth: Payer: Self-pay | Admitting: Urology

## 2017-07-08 ENCOUNTER — Ambulatory Visit (INDEPENDENT_AMBULATORY_CARE_PROVIDER_SITE_OTHER): Payer: Medicare Other

## 2017-07-08 VITALS — BP 124/67 | HR 68 | Resp 16 | Ht 63.0 in | Wt 160.0 lb

## 2017-07-08 DIAGNOSIS — N390 Urinary tract infection, site not specified: Secondary | ICD-10-CM

## 2017-07-08 LAB — URINALYSIS, COMPLETE
Bilirubin, UA: NEGATIVE
Glucose, UA: NEGATIVE
Ketones, UA: NEGATIVE
Nitrite, UA: NEGATIVE
Specific Gravity, UA: 1.025 (ref 1.005–1.030)
Urobilinogen, Ur: 1 mg/dL (ref 0.2–1.0)
pH, UA: 5.5 (ref 5.0–7.5)

## 2017-07-08 LAB — MICROSCOPIC EXAMINATION

## 2017-07-08 NOTE — Addendum Note (Signed)
Addended by: Garnette Gunner on: 07/08/2017 11:43 AM   Modules accepted: Orders

## 2017-07-08 NOTE — Telephone Encounter (Signed)
Pt called office stating she is still having UTI symptoms. Pt states Dr. Erlene Quan advised her to come to office to leave sample if she didn't feel or get better. Pt has burning, stinging and frequency.  Added to Nurse visit today

## 2017-07-08 NOTE — Progress Notes (Signed)
Pt presents in office today for Nurse visit. She is c/o urinary frequency, difficulty postponing, burning/painful urination, leakage of urine, and urinary urgency. Pt was was recently treated w/ macrobid bid for 10 days. Per Dr. Erlene Quan, pt urine was sent out for cx and will be discussed at her upcoming appointment on 07/11/17.  Blood pressure 124/67, pulse 68, resp. rate 16, height 5\' 3"  (1.6 m), weight 160 lb (72.6 kg), SpO2 96 %.

## 2017-07-11 ENCOUNTER — Encounter: Payer: Self-pay | Admitting: Urology

## 2017-07-11 ENCOUNTER — Ambulatory Visit (INDEPENDENT_AMBULATORY_CARE_PROVIDER_SITE_OTHER): Payer: Medicare Other | Admitting: Urology

## 2017-07-11 VITALS — BP 140/60 | HR 76 | Ht 63.0 in | Wt 160.0 lb

## 2017-07-11 DIAGNOSIS — N390 Urinary tract infection, site not specified: Secondary | ICD-10-CM | POA: Diagnosis not present

## 2017-07-11 DIAGNOSIS — R350 Frequency of micturition: Secondary | ICD-10-CM

## 2017-07-11 DIAGNOSIS — N12 Tubulo-interstitial nephritis, not specified as acute or chronic: Secondary | ICD-10-CM

## 2017-07-11 LAB — BLADDER SCAN AMB NON-IMAGING

## 2017-07-11 MED ORDER — NITROFURANTOIN MONOHYD MACRO 100 MG PO CAPS: 100.0000 mg | ORAL_CAPSULE | Freq: Two times a day (BID) | ORAL | 0 refills | Status: DC

## 2017-07-11 NOTE — Progress Notes (Signed)
07/11/2017 2:29 PM   Sudie Bailey 22-Nov-1940 [id number]  Referring provider: Albina Billet, MD [address]   [place],  [address]  Chief Complaint  Patient presents with  . Recurrent UTI    HPI: 77 year old female with a personal history of breast cancer who returns the office today with ongoing urinary symptoms and recurrent UTIs.  Since December, she had numerous positive UA/urine cultures consistent with E. coli urinary tract infection.  She is been treated with multiple rounds of antibiotics.  More recently, her sensitivity suggest increasing resistance pattern to most oral antibiotics other than Macrobid.  Most recently, she presented with urgency, frequency dysuria flank pain and had a UA/urine culture on 06/12/2017.  She ultimately grew E. coli resistant to cephalosporins, Cipro, Levaquin, Bactrim.  She was prescribed Macrobid for 10 days.  After the 10 days, her symptoms improved briefly but then quickly recurred.  She presented to our office with recurrent symptoms on 07/08/2017 and a luminary culture results are now growing E. coli again.  In the interim, she is also had CT scan and MRI for abdominal pain.  CT scan of the abdomen pelvis with contrast on 05/21/2017 showed subtle areas of hypoperfusion at the upper pole of the left kidney thought to represent a focal pyelonephritis.  She also had 2 subtle filling defects which were ill-defined with decreased parenchymal enhancement on the left kidney consistent with multifocal pyelonephritis incidentally on MRCP on 06/06/2017.  No stones or masses.  She does endorse subjective night sweats and occasionally fevers up to 101 during the night.    PMH: Past Medical History:  Diagnosis Date  . Abdominal pain   . Allergy   . Cancer [facility]) 2017   breast cancer- Left  . Cataract   . CHF (congestive heart failure) ([place])   . Collagenous colitis   . Diabetes mellitus without complication ([place])   . Diarrhea   .  Diverticulosis   . Fatty liver disease, nonalcoholic   . Fibrocystic breast   . GERD (gastroesophageal reflux disease)   . Heart murmur   . Hyperlipidemia   . Hypertension   . Hypothyroidism   . IBS (irritable bowel syndrome)   . IBS (irritable bowel syndrome)   . IDA (iron deficiency anemia)   . Personal history of radiation therapy   . PONV (postoperative nausea and vomiting)   . Sleep apnea    C-Pap    Surgical History: Past Surgical History:  Procedure Laterality Date  . ABDOMINAL HYSTERECTOMY     tah bso  . ABDOMINAL SURGERY    . APPENDECTOMY    . BREAST BIOPSY Bilateral    negative  . 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 BREAST BIOPSY Left 05/27/2016   neg  . CARDIAC SURGERY    . CATARACT EXTRACTION Right   . CHOLECYSTECTOMY    . COLONOSCOPY WITH PROPOFOL N/A 03/11/2016   Procedure: COLONOSCOPY WITH PROPOFOL;  Surgeon: Manya Silvas, MD;  Location: [facility] ENDOSCOPY;  Service: Endoscopy;  Laterality: N/A;  . ESOPHAGOGASTRODUODENOSCOPY (EGD) WITH PROPOFOL N/A 03/11/2016   Procedure: ESOPHAGOGASTRODUODENOSCOPY (EGD) WITH PROPOFOL;  Surgeon: Manya Silvas, MD;  Location: [facility] ENDOSCOPY;  Service: Endoscopy;  Laterality: N/A;  . JOINT REPLACEMENT Left    TKR  . left sinusplasty     . MASTECTOMY, PARTIAL Left 10/17/2015   Procedure: MASTECTOMY PARTIAL REVISION;  Surgeon: Leonie Green, MD;  Location: [facility] ORS;  Service: General;  Laterality:  Left;  . PARTIAL MASTECTOMY WITH NEEDLE LOCALIZATION Left 09/29/2015   Procedure: PARTIAL MASTECTOMY WITH NEEDLE LOCALIZATION;  Surgeon: Leonie Green, MD;  Location: [facility] ORS;  Service: General;  Laterality: Left;  . TOTAL ABDOMINAL HYSTERECTOMY W/ BILATERAL SALPINGOOPHORECTOMY      Home Medications:  Allergies as of 07/11/2017      Reactions   Fentanyl Shortness Of Breath, Itching, Nausea And Vomiting   Codeine Itching, Nausea And Vomiting   Demeclocycline Other (See Comments), Rash     Demerol [meperidine] Itching, Nausea And Vomiting   Hydrocodone-acetaminophen Itching, Nausea And Vomiting   Other Other (See Comments)   Oxycodone Itching, Nausea And Vomiting   Pentazocine Other (See Comments)   Tetracyclines & Related Rash      Medication List        Accurate as of 07/11/17  2:29 PM. Always use your most recent med list.          aspirin 81 MG tablet Take 81 mg by mouth at bedtime.   B-12 COMPLIANCE INJECTION 1000 MCG/ML Kit Generic drug:  Cyanocobalamin Inject 1 Syringe as directed every 30 (thirty) days.   budesonide-formoterol 80-4.5 MCG/ACT inhaler Commonly known as:  SYMBICORT Inhale 2 puffs into the lungs 2 (two) times daily as needed (shortness of breath).   cholecalciferol 1000 units tablet Commonly known as:  VITAMIN D Take 2,000 Units by mouth daily.   escitalopram 5 MG tablet Commonly known as:  LEXAPRO Take 5 mg by mouth at bedtime.   esomeprazole 40 MG capsule Commonly known as:  NEXIUM Take 40 mg by mouth daily at 12 noon.   fenofibrate 160 MG tablet Take 160 mg by mouth at bedtime.   fexofenadine 180 MG tablet Commonly known as:  ALLEGRA Take 180 mg by mouth at bedtime.   fluticasone 50 MCG/ACT nasal spray Commonly known as:  FLONASE Place 1 spray into the nose daily as needed for allergies.   furosemide 20 MG tablet Commonly known as:  LASIX Take 20-40 mg by mouth every other day. Alternate between 1 a day and 2 a day   gabapentin 300 MG capsule Commonly known as:  NEURONTIN Take 300 mg by mouth daily.   glimepiride 2 MG tablet Commonly known as:  AMARYL Take 2 mg by mouth at bedtime.   hydrOXYzine 25 MG tablet Commonly known as:  ATARAX/VISTARIL 1 or 2 tablets at night for itching and sleep   Hyoscyamine Sulfate 0.375 MG Tbcr Take 0.375 mg by mouth 2 (two) times daily.   levothyroxine 75 MCG tablet Commonly known as:  SYNTHROID, LEVOTHROID Take 75 mcg by mouth daily before breakfast.   magnesium oxide 400  MG tablet Commonly known as:  MAG-OX Take by mouth.   mirabegron ER 50 MG Tb24 tablet Commonly known as:  MYRBETRIQ Take 1 tablet (50 mg total) by mouth daily.   MISC NATURAL PRODUCTS PO Take by mouth.   montelukast 10 MG tablet Commonly known as:  SINGULAIR Take 10 mg by mouth at bedtime.   olopatadine 0.1 % ophthalmic solution Commonly known as:  PATANOL 1 drop 2 (two) times daily.   ondansetron 4 MG tablet Commonly known as:  ZOFRAN Take 4 mg by mouth every 6 (six) hours as needed. for nausea   potassium chloride SA 20 MEQ tablet Commonly known as:  K-DUR,KLOR-CON Take by mouth.   saccharomyces boulardii 250 MG capsule Commonly known as:  FLORASTOR Take 250 mg by mouth 2 (two) times daily.   simvastatin 20 MG  tablet Commonly known as:  ZOCOR Take 20 mg by mouth at bedtime.   TRESIBA FLEXTOUCH [place] Inject 10 Units into the skin at bedtime.   valACYclovir 500 MG tablet Commonly known as:  VALTREX Take 500 mg by mouth 2 (two) times daily.   valsartan 160 MG tablet Commonly known as:  DIOVAN Take 160 mg by mouth every morning.       Allergies:  Allergies  Allergen Reactions  . Fentanyl Shortness Of Breath, Itching and Nausea And Vomiting  . Codeine Itching and Nausea And Vomiting  . Demeclocycline Other (See Comments) and Rash  . Demerol [Meperidine] Itching and Nausea And Vomiting  . Hydrocodone-Acetaminophen Itching and Nausea And Vomiting  . Other Other (See Comments)  . Oxycodone Itching and Nausea And Vomiting  . Pentazocine Other (See Comments)  . Tetracyclines & Related Rash    Family History: Family History  Problem Relation Age of Onset  . Breast cancer Paternal Grandmother   . Colon cancer Father   . Diabetes Sister   . Diabetes Brother   . Heart disease Brother   . Prostate cancer Brother   . Colon cancer Maternal Uncle   . Prostate cancer Brother   . Bladder Cancer Brother   . Ovarian cancer Neg Hx   . Kidney cancer Neg Hx      Social History:  reports that she has never smoked. She has never used smokeless tobacco. She reports that she does not drink alcohol or use drugs.  ROS: UROLOGY Frequent Urination?: Yes Hard to postpone urination?: Yes Burning/pain with urination?: Yes Get up at night to urinate?: Yes Leakage of urine?: Yes Urine stream starts and stops?: No Trouble starting stream?: No Do you have to strain to urinate?: No Blood in urine?: No Urinary tract infection?: Yes Sexually transmitted disease?: No Injury to kidneys or bladder?: No Painful intercourse?: No Weak stream?: No Currently pregnant?: No Vaginal bleeding?: No Last menstrual period?: n  Gastrointestinal Nausea?: No Vomiting?: No Indigestion/heartburn?: No Diarrhea?: Yes Constipation?: Yes  Constitutional Fever: No Night sweats?: No Weight loss?: No Fatigue?: Yes  Skin Skin rash/lesions?: No Itching?: No  Eyes Blurred vision?: No Double vision?: No  Ears/Nose/Throat Sore throat?: No Sinus problems?: Yes  Hematologic/Lymphatic Swollen glands?: No Easy bruising?: Yes  Cardiovascular Leg swelling?: No Chest pain?: No  Respiratory Cough?: Yes Shortness of breath?: No  Endocrine Excessive thirst?: Yes  Musculoskeletal Back pain?: Yes Joint pain?: No  Neurological Headaches?: No Dizziness?: No  Psychologic Depression?: No Anxiety?: No  Physical Exam: BP 140/60   Pulse 76   Ht _0  (1.6 m)   Wt 160 lb (72.6 kg)   LMP  (LMP Unknown)   BMI 28.34 kg/m   Constitutional:  Alert and oriented, No acute distress. HEENT: [place] AT, moist mucus membranes.  Trachea midline, no masses. Cardiovascular: No clubbing, cyanosis, or edema. Respiratory: Normal respiratory effort, no increased work of breathing. GI: Abdomen is soft, nontender, nondistended, no abdominal masses GU: Mild CVA tenderness over both kidneys. Skin: No rashes, bruises or suspicious lesions. Neurologic: Grossly intact, no focal  deficits, moving all 4 extremities. Psychiatric: Somewhat anxious and tearful discussing some of her psychological stressors including husband with dementia and ill family member with brain cancer.  Laboratory Data: Lab Results  Component Value Date   WBC 4.1 05/28/2017   HGB 10.8 (L) 05/28/2017   HCT 31.8 (L) 05/28/2017   MCV 90.2 05/28/2017   PLT 143 (L) 05/28/2017    Lab Results  Component Value Date   CREATININE 0.93 05/28/2017    Urinalysis/ Imaging Results for orders placed or performed in visit on 07/11/17  BLADDER SCAN AMB NON-IMAGING  Result Value Ref Range   Scan Result 12m     MRCP from 06/06/2017 as well as CT scan abdomen pelvis with contrast on 05/21/2016 personally reviewed today.  Agree with findings, most consistent with multifocal pyelonephritis.  Extensive review of urinalysis over the past 6 months and urine culture performed today, see Epic.  Assessment & Plan:    1. Recurrent UTI Suspect ongoing incompletely treated E. coli urinary tract infection/pyelonephritis We discussed given increasing resistance pattern, Macrobid is likely not sufficient to treat infection within the tissue of the kidney Plan for referral to ID for IV antibiotics In the interim, warning symptoms of signs and symptoms of sepsis were reviewed, advised to present to the emergency room if she displays any of these worsening symptoms Adequate bladder emptying and no evidence of stones underlying factor Not a candidate for topical estrogen given history of breast cancer  2. Pyelonephritis As above, suspect multifocal pyelonephritis incompletely treated with Macrobid - Ambulatory referral to Infectious Disease  3. Urinary frequency Continue Myrbetriq Hopefully will improve with adequate clearance of UTI - BLADDER SCAN AMB NON-IMAGING   AHollice Espy MD  [place] [place] [phone]

## 2017-07-13 LAB — CULTURE, URINE COMPREHENSIVE

## 2017-07-15 ENCOUNTER — Ambulatory Visit: Payer: Medicare Other | Admitting: Urology

## 2017-07-23 ENCOUNTER — Telehealth: Payer: Self-pay

## 2017-07-23 ENCOUNTER — Other Ambulatory Visit: Payer: Self-pay | Admitting: Infectious Diseases

## 2017-07-23 ENCOUNTER — Encounter: Payer: Self-pay | Admitting: Infectious Diseases

## 2017-07-23 ENCOUNTER — Ambulatory Visit (INDEPENDENT_AMBULATORY_CARE_PROVIDER_SITE_OTHER): Payer: Medicare Other | Admitting: Infectious Diseases

## 2017-07-23 VITALS — BP 133/69 | HR 65 | Temp 98.1°F | Ht 63.0 in | Wt 165.0 lb

## 2017-07-23 DIAGNOSIS — N390 Urinary tract infection, site not specified: Secondary | ICD-10-CM | POA: Diagnosis not present

## 2017-07-23 DIAGNOSIS — Z794 Long term (current) use of insulin: Secondary | ICD-10-CM | POA: Diagnosis not present

## 2017-07-23 DIAGNOSIS — E119 Type 2 diabetes mellitus without complications: Secondary | ICD-10-CM | POA: Diagnosis not present

## 2017-07-23 NOTE — Assessment & Plan Note (Signed)
Will give her 10 days of invanz.  Will work with nursing, home health, get [facility].  I am not sure that this will "cure" her kidney or clear her urine.  If her CT continues to show changes, she may need nephrectomy? Will defer to urology.  See her back in 2 weeks.

## 2017-07-23 NOTE — Assessment & Plan Note (Signed)
Her FSG are "high". Will need good control to help control her urine.

## 2017-07-23 NOTE — Telephone Encounter (Signed)
Called IR to schedule a picc line placement for pt. Left a vm for Jennifer at IR to give the office a call back to have this scheduled. Will fax orders to [facility] and short stay once appt is make at IR.  First dose at short stay Aundria Rud, [place]

## 2017-07-23 NOTE — Progress Notes (Signed)
   Subjective:    Patient ID: Annette Foster, female    DOB: 01-Aug-1940, 77 y.o.   MRN: [id number]  HPI 77 yo F with recurrent UTI since 12-2017. She has been followed at [facility].  Prev her Cx have grown E coli, with an increasing resistance pattern (s- macrobid, Imipenem/merrem, zosyn, I- augmentin).  Her last rx was macrobid which she started 1 week ago (completes tonight).  She had a previous MRCP which incidentally showed L multifocal pyelonephritis (06-06-17).  She was referred to ID by uro for incomplete rx of UTI and IV anbx.  She has had burning, stinging, urgency with this. She is mybetriq for this with minimal relief. Her anbx have not helped these symptoms.   The past medical history, family history and social history were reviewed/updated in [place] Husband has dementia, spends much of her day visiting him in "memory unit".   Review of Systems  Constitutional: Positive for fever. Negative for appetite change, chills and unexpected weight change.  Eyes: Negative for visual disturbance.  Respiratory: Positive for cough. Negative for shortness of breath.   Gastrointestinal: Negative for constipation and diarrhea.  Endocrine: Positive for polyuria.  Genitourinary: Positive for difficulty urinating, frequency, hematuria and urgency.  Neurological: Negative for numbness.  cough from allergies.  Please see HPI. All other systems reviewed and negative.      Objective:   Physical Exam  Constitutional: She is oriented to person, place, and time. She appears well-developed and well-nourished.  HENT:  Mouth/Throat: No oropharyngeal exudate.  Eyes: Pupils are equal, round, and reactive to light. EOM are normal.  Neck: Normal range of motion. Neck supple.  Cardiovascular: Normal rate, regular rhythm and normal heart sounds.  Pulmonary/Chest: Effort normal and breath sounds normal.  Abdominal: Soft. Bowel sounds are normal. There is CVA tenderness.  L CVA tenderness  Musculoskeletal: Normal  range of motion. She exhibits no edema.       Arms: Lymphadenopathy:    She has no cervical adenopathy.  Neurological: She is alert and oriented to person, place, and time.  Skin: Skin is warm and dry.  Psychiatric: She has a normal mood and affect.  Tearful when talking about her husband.       Assessment & Plan:

## 2017-07-23 NOTE — Telephone Encounter (Signed)
Patient called to confirm her PICC appointment. She wants to see if it can be rescheduled (she is concerned about her husband's upcoming surgery 7/2 and his preop visits).  She would like to see if it can be placed before 6/18. RN gave her IR scheduling's phone number. Landis Gandy, RN

## 2017-07-23 NOTE — Telephone Encounter (Signed)
Called IR to schedule an appt for pt PICC line placement spoke with IR to schedule an appt. Spoke with Caryl Pina at [facility] who was able to make an appt for 07/29/17 at [place]. Pt is to arrive at [facility] IR by 8:30.   Called pt to inform him an appt was unable to reach pt via phone call. Left a vm for pt to call us back regarding an appt for picc placement. First dose of medication is to be done at Short Stay.  [place]

## 2017-07-24 ENCOUNTER — Encounter: Payer: Medicare Other | Admitting: Obstetrics and Gynecology

## 2017-07-24 LAB — URINE CULTURE
MICRO NUMBER:: 90705144
Result:: NO GROWTH
SPECIMEN QUALITY:: ADEQUATE

## 2017-07-24 LAB — MICROALBUMIN / CREATININE URINE RATIO
Creatinine, Urine: 87 mg/dL (ref 20–275)
Microalb Creat Ratio: 52 mcg/mg creat — ABNORMAL HIGH (ref <30)
Microalb, Ur: 4.5 mg/dL

## 2017-07-29 ENCOUNTER — Ambulatory Visit (HOSPITAL_COMMUNITY): Payer: Medicare Other

## 2017-07-29 ENCOUNTER — Encounter (HOSPITAL_COMMUNITY)
Admission: RE | Admit: 2017-07-29 | Discharge: 2017-07-29 | Disposition: A | Discharge status: Home / Self Care | Payer: Medicare Other | Source: Ambulatory Visit | Attending: Infectious Diseases | Admitting: Infectious Diseases

## 2017-07-29 ENCOUNTER — Ambulatory Visit (HOSPITAL_COMMUNITY)
Admission: RE | Admit: 2017-07-29 | Discharge: 2017-07-29 | Disposition: A | Discharge status: Home / Self Care | Payer: Medicare Other | Source: Ambulatory Visit | Attending: Infectious Diseases | Admitting: Infectious Diseases

## 2017-07-29 ENCOUNTER — Other Ambulatory Visit (HOSPITAL_COMMUNITY): Payer: Self-pay

## 2017-07-29 DIAGNOSIS — N390 Urinary tract infection, site not specified: Secondary | ICD-10-CM | POA: Diagnosis present

## 2017-07-29 DIAGNOSIS — N3000 Acute cystitis without hematuria: Secondary | ICD-10-CM | POA: Insufficient documentation

## 2017-07-29 MED ORDER — HEPARIN SOD (PORK) LOCK FLUSH 100 UNIT/ML IV SOLN
250.0000 [IU] | INTRAVENOUS | Status: AC | PRN
  Administered 2017-07-29: 250 [IU]

## 2017-07-29 MED ORDER — HEPARIN SOD (PORK) LOCK FLUSH 100 UNIT/ML IV SOLN
INTRAVENOUS | Status: AC
  Filled 2017-07-29: qty 5

## 2017-07-29 MED ORDER — ERTAPENEM SODIUM 1 G IJ SOLR
1.0000 g | Freq: Once | INTRAMUSCULAR | Status: DC
  Filled 2017-07-29: qty 1

## 2017-07-29 MED ORDER — SODIUM CHLORIDE 0.9 % IV SOLN
1.0000 g | Freq: Once | INTRAVENOUS | Status: AC
  Administered 2017-07-29: 1 g via INTRAVENOUS
  Filled 2017-07-29: qty 1

## 2017-07-29 MED ORDER — LIDOCAINE HCL 1 % IJ SOLN
INTRAMUSCULAR | Status: AC
  Filled 2017-07-29: qty 20

## 2017-07-29 MED ORDER — LIDOCAINE HCL (PF) 1 % IJ SOLN
INTRAMUSCULAR | Status: DC | PRN
  Administered 2017-07-29: 2 mL

## 2017-07-29 NOTE — Procedures (Signed)
PROCEDURE SUMMARY:  Successful placement of single lumen PICC line to right basilic vein. Length 39 cm Tip at lower SVC/RA No complications Ready for use.  Please see full dictation in Imaging section for details.   Erza Mothershead S Breccan Galant PA-C 07/29/2017 2:22 PM

## 2017-07-31 ENCOUNTER — Encounter: Payer: Self-pay | Admitting: Obstetrics and Gynecology

## 2017-07-31 ENCOUNTER — Ambulatory Visit (INDEPENDENT_AMBULATORY_CARE_PROVIDER_SITE_OTHER): Payer: Medicare Other | Admitting: Obstetrics and Gynecology

## 2017-07-31 VITALS — BP 143/61 | HR 72 | Ht 63.0 in | Wt 165.5 lb

## 2017-07-31 DIAGNOSIS — N763 Subacute and chronic vulvitis: Secondary | ICD-10-CM | POA: Diagnosis not present

## 2017-07-31 DIAGNOSIS — N390 Urinary tract infection, site not specified: Secondary | ICD-10-CM | POA: Diagnosis not present

## 2017-07-31 NOTE — Patient Instructions (Signed)
1.  Diflucan 150 mg orally every 3 days x 3 doses to help with potential monilia vulvitis exacerbation while on chronic antibiotics 2.  Continue with PICC line IV antibiotics daily as prescribed through infectious disease specialist for another 10 days 3.  Return in 3 months for follow-up or sooner if chronic vulvar symptom exacerbation occurs.

## 2017-07-31 NOTE — Progress Notes (Signed)
Chief complaint: 1.  Chronic vulvitis 2.  History of recurrent UTIs  Annette Foster presents today for follow-up on chronic vulvitis.  Last visit was 6 months ago.  Symptoms of perineal irritation, minimal at this time except for bouts of chronic recurrent diarrhea while on IV antibiotics which has led to some perirectal inflammation/irritation. She has a PICC line in place now for IV antibiotics for another 10 days to try and eradicate chronic recurrent UTIs that have been unresponsive to oral antibiotics.  Past Medical History:  Diagnosis Date  . Abdominal pain   . Allergy   . Cancer [facility]) 2017   breast cancer- Left  . Cataract   . CHF (congestive heart failure) ([place])   . Collagenous colitis   . Diabetes mellitus without complication ([place])   . Diarrhea   . Diverticulosis   . Fatty liver disease, nonalcoholic   . Fibrocystic breast   . GERD (gastroesophageal reflux disease)   . Heart murmur   . Hyperlipidemia   . Hypertension   . Hypothyroidism   . IBS (irritable bowel syndrome)   . IBS (irritable bowel syndrome)   . IDA (iron deficiency anemia)   . Personal history of radiation therapy   . PONV (postoperative nausea and vomiting)   . Sleep apnea    C-Pap   Past Surgical History:  Procedure Laterality Date  . ABDOMINAL HYSTERECTOMY     tah bso  . ABDOMINAL SURGERY    . APPENDECTOMY    . BREAST BIOPSY Bilateral    negative  . 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 BREAST BIOPSY Left 05/27/2016   neg  . CARDIAC SURGERY    . CATARACT EXTRACTION Right   . CHOLECYSTECTOMY    . COLONOSCOPY WITH PROPOFOL N/A 03/11/2016   Procedure: COLONOSCOPY WITH PROPOFOL;  Surgeon: Manya Silvas, MD;  Location: [facility] ENDOSCOPY;  Service: Endoscopy;  Laterality: N/A;  . ESOPHAGOGASTRODUODENOSCOPY (EGD) WITH PROPOFOL N/A 03/11/2016   Procedure: ESOPHAGOGASTRODUODENOSCOPY (EGD) WITH PROPOFOL;  Surgeon: Manya Silvas, MD;  Location: [facility] ENDOSCOPY;  Service:  Endoscopy;  Laterality: N/A;  . JOINT REPLACEMENT Left    TKR  . left sinusplasty     . MASTECTOMY, PARTIAL Left 10/17/2015   Procedure: MASTECTOMY PARTIAL REVISION;  Surgeon: Leonie Green, MD;  Location: [facility] ORS;  Service: General;  Laterality: Left;  . PARTIAL MASTECTOMY WITH NEEDLE LOCALIZATION Left 09/29/2015   Procedure: PARTIAL MASTECTOMY WITH NEEDLE LOCALIZATION;  Surgeon: Leonie Green, MD;  Location: [facility] ORS;  Service: General;  Laterality: Left;  . TOTAL ABDOMINAL HYSTERECTOMY W/ BILATERAL SALPINGOOPHORECTOMY      Review of Systems  Constitutional: Negative for chills, diaphoresis, fever and malaise/fatigue.  Respiratory: Negative.   Cardiovascular: Negative.   Gastrointestinal: Positive for diarrhea. Negative for abdominal pain, blood in stool, nausea and vomiting.  Genitourinary: Negative for dysuria, flank pain, frequency and urgency.  Musculoskeletal: Negative.   Neurological: Negative.   Psychiatric/Behavioral: Negative.     OBJECTIVE: BP (!) 143/61   Pulse 72   Ht 5' 3"  (1.6 m)   Wt 165 lb 8 oz (75.1 kg)   LMP  (LMP Unknown)   BMI 29.32 kg/m  Pleasant elderly female in no distress.  Alert and oriented.  Affect is appropriate. Back: No CVA tenderness Abdomen: Soft, nontender; no organomegaly Bladder: Nontender Pelvic: External genitalia-atrophic changes; minimal labia majora hyperemia, symmetric, stable; no leukoplakia; no ulceration BUS-normal Vagina-atrophic changes; no discharge Bimanual-not done Rectovaginal-external hemorrhoids; perianal inflammation present;  no ulceration Extremities-PICC line right arm  ASSESSMENT: 1.  Chronic vulvitis, stable 2.  History of recurrent UTIs, now with significant left kidney damage, currently undergoing daily IV antibiotic therapy through PICC line 3.  Antibiotic induced diarrhea 4.  Life stressors  PLAN: 1.  Diflucan 150 mg orally every 3 days x 3 doses 2.  Continue with IV antibiotics as prescribed  by infectious disease-INVANZ 3.  Recommend Desitin to help minimize perianal irritation from chronic diarrhea 4.  Return in 3 months for follow-up  A total of 15 minutes were spent face-to-face with the patient during this encounter and over half of that time dealt with counseling and coordination of care.  Brayton Mars, MD  Note: This dictation was prepared with Dragon dictation along with smaller phrase technology. Any transcriptional errors that result from this process are unintentional.

## 2017-08-04 ENCOUNTER — Encounter: Payer: Self-pay | Admitting: Infectious Diseases

## 2017-08-13 ENCOUNTER — Ambulatory Visit (INDEPENDENT_AMBULATORY_CARE_PROVIDER_SITE_OTHER): Payer: Medicare Other | Admitting: Infectious Diseases

## 2017-08-13 ENCOUNTER — Encounter: Payer: Self-pay | Admitting: Infectious Diseases

## 2017-08-13 DIAGNOSIS — Z794 Long term (current) use of insulin: Secondary | ICD-10-CM | POA: Diagnosis not present

## 2017-08-13 DIAGNOSIS — N390 Urinary tract infection, site not specified: Secondary | ICD-10-CM | POA: Diagnosis not present

## 2017-08-13 DIAGNOSIS — N2889 Other specified disorders of kidney and ureter: Secondary | ICD-10-CM

## 2017-08-13 DIAGNOSIS — E119 Type 2 diabetes mellitus without complications: Secondary | ICD-10-CM

## 2017-08-13 NOTE — Assessment & Plan Note (Signed)
Her Glc was 245 this AM She slept through her PM dose of insulin last night as well as her oral dm agents.  She needs better control or she will continue to have difficulty with her urine as well as other end-organ damage.

## 2017-08-13 NOTE — Assessment & Plan Note (Addendum)
Will send her for check MRI given her prev change in her L kidney  We spoke that she does not need further anbx at this point Will hold til we have sx of infection (f/c, worsening pain).  Will not repeat her UCx or UA today.  Her last Cr was 0.62 (08-04-17) Will defer to her urologist with regards to adding vaginal estrogen for sx relief.  rtc in 2 weeks

## 2017-08-13 NOTE — Progress Notes (Signed)
Per Dr Johnnye Sima 38 cm Single lumen Peripherally Inserted Central Catheter  removed from right basilic . Excessive bleeding present but stopped after pressure was applied. No sutures present. Dressing was clean and dry . Area cleansed with chlorhexidine and petroleum dressing applied.  PPt advised no heavy lifting with this arm, leave dressing for 24 hours and call the office if dressing becomes soaked with blood or sharp pain presents.  Pt tolerated procedure well.    Laverle Patter, RN

## 2017-08-13 NOTE — Progress Notes (Signed)
   Subjective:    Patient ID: Annette Foster, female    DOB: 08/11/1940, 77 y.o.   MRN: [id number]  HPI 77 yo F with recurrent UTI since 12-2017. She has been followed at [facility].  Prev her Cx have grown E coli, with an increasing resistance pattern (s- macrobid, Imipenem/merrem, zosyn, I- augmentin).  Her last rx was macrobid which she started 1 week ago (completes tonight).  She had a previous MRCP which incidentally showed L multifocal pyelonephritis (06-06-17).  She was referred to ID by uro for incomplete rx of UTI and IV anbx.  She was seen in ID on 07-23-17 and was started on invanz. She received this for 11 days (08-07-17).  Today she continues to have burning and stinging of her urine.  Her PIC has not been removed yet.  Was prev on premarin (on study) until she got breast CA in 2016.  Has not been on vaginal estrogen.   Review of Systems  Constitutional: Negative for chills and fever.  Gastrointestinal: Positive for diarrhea. Negative for constipation.  Endocrine: Positive for polyuria.  Genitourinary: Positive for difficulty urinating and dysuria. Negative for hematuria.  Please see HPI. All other systems reviewed and negative.      Objective:   Physical Exam  Constitutional: She appears well-developed and well-nourished.  HENT:  Mouth/Throat: No oropharyngeal exudate.  Eyes: Pupils are equal, round, and reactive to light. EOM are normal.  Cardiovascular: Normal rate, regular rhythm and normal heart sounds.  Pulmonary/Chest: Effort normal and breath sounds normal.  Abdominal: Soft. Bowel sounds are normal. There is no tenderness. There is CVA tenderness (L sided. ). There is no guarding.  Musculoskeletal: She exhibits no edema.       Arms:      Assessment & Plan:

## 2017-08-18 ENCOUNTER — Telehealth: Payer: Self-pay

## 2017-08-27 ENCOUNTER — Other Ambulatory Visit: Payer: Self-pay

## 2017-08-27 ENCOUNTER — Ambulatory Visit (INDEPENDENT_AMBULATORY_CARE_PROVIDER_SITE_OTHER): Payer: Medicare Other | Admitting: Infectious Diseases

## 2017-08-27 ENCOUNTER — Encounter: Payer: Self-pay | Admitting: Internal Medicine

## 2017-08-27 ENCOUNTER — Encounter: Payer: Self-pay | Admitting: Infectious Diseases

## 2017-08-27 ENCOUNTER — Inpatient Hospital Stay (HOSPITAL_BASED_OUTPATIENT_CLINIC_OR_DEPARTMENT_OTHER): Payer: Medicare Other | Admitting: Internal Medicine

## 2017-08-27 ENCOUNTER — Inpatient Hospital Stay: Payer: Medicare Other | Attending: Internal Medicine

## 2017-08-27 VITALS — BP 114/64 | HR 62 | Temp 98.9°F | Resp 20

## 2017-08-27 DIAGNOSIS — K766 Portal hypertension: Secondary | ICD-10-CM | POA: Diagnosis not present

## 2017-08-27 DIAGNOSIS — D0512 Intraductal carcinoma in situ of left breast: Secondary | ICD-10-CM | POA: Diagnosis present

## 2017-08-27 DIAGNOSIS — D6959 Other secondary thrombocytopenia: Secondary | ICD-10-CM | POA: Insufficient documentation

## 2017-08-27 DIAGNOSIS — N763 Subacute and chronic vulvitis: Secondary | ICD-10-CM

## 2017-08-27 DIAGNOSIS — K7460 Unspecified cirrhosis of liver: Secondary | ICD-10-CM

## 2017-08-27 DIAGNOSIS — E119 Type 2 diabetes mellitus without complications: Secondary | ICD-10-CM

## 2017-08-27 DIAGNOSIS — D61818 Other pancytopenia: Secondary | ICD-10-CM | POA: Insufficient documentation

## 2017-08-27 DIAGNOSIS — N390 Urinary tract infection, site not specified: Secondary | ICD-10-CM

## 2017-08-27 DIAGNOSIS — Z794 Long term (current) use of insulin: Secondary | ICD-10-CM

## 2017-08-27 LAB — COMPREHENSIVE METABOLIC PANEL
ALT: 24 U/L (ref 0–44)
AST: 48 U/L — ABNORMAL HIGH (ref 15–41)
Albumin: 3.8 g/dL (ref 3.5–5.0)
Alkaline Phosphatase: 68 U/L (ref 38–126)
Anion gap: 7 (ref 5–15)
BUN: 12 mg/dL (ref 8–23)
CO2: 24 mmol/L (ref 22–32)
Calcium: 9.4 mg/dL (ref 8.9–10.3)
Chloride: 101 mmol/L (ref 98–111)
Creatinine, Ser: 0.61 mg/dL (ref 0.44–1.00)
GFR calc Af Amer: >60 mL/min (ref >60)
GFR calc non Af Amer: >60 mL/min (ref >60)
Glucose, Bld: 334 mg/dL — ABNORMAL HIGH (ref 70–99)
Potassium: 4 mmol/L (ref 3.5–5.1)
Sodium: 132 mmol/L — ABNORMAL LOW (ref 135–145)
Total Bilirubin: 0.8 mg/dL (ref 0.3–1.2)
Total Protein: 8.3 g/dL — ABNORMAL HIGH (ref 6.5–8.1)

## 2017-08-27 LAB — CBC WITH DIFFERENTIAL/PLATELET
Basophils Absolute: 0 10*3/uL (ref 0–0.1)
Basophils Relative: 1 %
Eosinophils Absolute: 0.1 10*3/uL (ref 0–0.7)
Eosinophils Relative: 2 %
HCT: 31.3 % — ABNORMAL LOW (ref 35.0–47.0)
Hemoglobin: 10.4 g/dL — ABNORMAL LOW (ref 12.0–16.0)
Lymphocytes Relative: 39 %
Lymphs Abs: 1.2 10*3/uL (ref 1.0–3.6)
MCH: 29.5 pg (ref 26.0–34.0)
MCHC: 33.4 g/dL (ref 32.0–36.0)
MCV: 88.4 fL (ref 80.0–100.0)
Monocytes Absolute: 0.2 10*3/uL (ref 0.2–0.9)
Monocytes Relative: 8 %
Neutro Abs: 1.5 10*3/uL (ref 1.4–6.5)
Neutrophils Relative %: 50 %
Platelets: 119 10*3/uL — ABNORMAL LOW (ref 150–440)
RBC: 3.54 MIL/uL — ABNORMAL LOW (ref 3.80–5.20)
RDW: 14.7 % — ABNORMAL HIGH (ref 11.5–14.5)
WBC: 3 10*3/uL — ABNORMAL LOW (ref 3.6–11.0)

## 2017-08-27 LAB — LACTATE DEHYDROGENASE: LDH: 164 U/L (ref 98–192)

## 2017-08-27 MED ORDER — FLUCONAZOLE 100 MG PO TABS: 100.0000 mg | ORAL_TABLET | Freq: Every day | ORAL | 0 refills | Status: DC

## 2017-08-27 NOTE — Assessment & Plan Note (Signed)
Her sx are unchanged She needs to f/u with uro Await her MRI Will see her back after this is resulted.  I apologized to her multiple times that her calls were not returned and her MRI was not scheduled.

## 2017-08-27 NOTE — Assessment & Plan Note (Addendum)
# #   DCIS/papillary carcinoma in situ- Stable.  No clinical evidence of recurrence.  Awaiting mammogram in July.    #Given overall patient's poor tolerance to letrozole [however I suspect most of her symptoms are from recurrent UTI]; I think it is okay to hold off letrozole at this time.   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  Followed by GI.  #Recent UTI/Pyelonephritis-s/p IV Ivanz x 10 days [Dr.Hatcher]; awaiting Abdomen MRI. On flucanazole.   #Diabetes- poorly controlled-blood sugars 330; recommend checking blood sugars more frequently.  #Recommend follow-up in 6 months; labs; could consider starting back on letrozole at the time.  Dr.Tate/GI doc

## 2017-08-27 NOTE — Assessment & Plan Note (Addendum)
Will send her rx for diflucan to see if this improves her sx.  I have asked her to hold her statin while she is on this.

## 2017-08-27 NOTE — Progress Notes (Signed)
[facility] OFFICE PROGRESS NOTE  Patient Care Team: Albina Billet, MD as PCP - General (Internal Medicine)  Cancer Staging No matching staging information was found for the patient.   Oncology History   # AUG 2017- DCIS LEFT BREAST ER/PR- POS; positive margins [Dr.Smith]; s/p re-excision; s/p RT [finish RT Nov 10th 2017]; START ARIMIDEX- Jan 2018; Stopped in July 2018- sec to muscle cramps; Sep 2018- start Letrozole.;  JAN 2019-STOPPED Letrozole sec night sweats"  # Mild pancytopenia [CTApril 2019-cirrhosis/spleneomegaly]. OCT 2018- BMBx- MILD dyspoiesis; variable cellularity [10-50%]; FISH/Cytogenetics-NORMAL; F-One-NGS-declined by insurance.    # cirrhosis- ? etiology  # HRT [clinical trial thru [facility]; stopped July 2017 ]; CPAP      Ductal carcinoma in situ (DCIS) of left breast      INTERVAL HISTORY:  Annette Foster 77 y.o.  female pleasant patient above history of DCIS; cirrhosis portal hypertension is here for follow-up.  Patient is currently off her letrozole because of recent UTI/feeling poorly.  Patient states that she has been evaluated by ID in [facility] for recurrent UTIs; and she is currently awaiting an MRI of the abdomen.  Patient states she feels much better overall; however her back pain [attributable to kidney infection] is not resolved.  Review of Systems  Constitutional: Positive for malaise/fatigue. Negative for chills, diaphoresis, fever and weight loss.  HENT: Negative for nosebleeds and sore throat.   Eyes: Negative for double vision.  Respiratory: Negative for cough, hemoptysis, sputum production, shortness of breath and wheezing.   Cardiovascular: Negative for chest pain, palpitations, orthopnea and leg swelling.  Gastrointestinal: Negative for abdominal pain, blood in stool, constipation, diarrhea, heartburn, melena, nausea and vomiting.  Genitourinary: Negative for dysuria, frequency and urgency.  Musculoskeletal: Positive for back  pain. Negative for joint pain.  Skin: Negative.  Negative for itching and rash.  Neurological: Negative for dizziness, tingling, focal weakness, weakness and headaches.  Endo/Heme/Allergies: Does not bruise/bleed easily.  Psychiatric/Behavioral: Negative for depression. The patient is not nervous/anxious and does not have insomnia.       PAST MEDICAL HISTORY :  Past Medical History:  Diagnosis Date  . Abdominal pain   . Allergy   . Cancer [facility]) 2017   breast cancer- Left  . Cataract   . CHF (congestive heart failure) ([place])   . Collagenous colitis   . Diabetes mellitus without complication ([place])   . Diarrhea   . Diverticulosis   . Fatty liver disease, nonalcoholic   . Fibrocystic breast   . GERD (gastroesophageal reflux disease)   . Heart murmur   . Hyperlipidemia   . Hypertension   . Hypothyroidism   . IBS (irritable bowel syndrome)   . IBS (irritable bowel syndrome)   . IDA (iron deficiency anemia)   . Personal history of radiation therapy   . PONV (postoperative nausea and vomiting)   . Sleep apnea    C-Pap    PAST SURGICAL HISTORY :   Past Surgical History:  Procedure Laterality Date  . ABDOMINAL HYSTERECTOMY     [place] bso  . ABDOMINAL SURGERY    . APPENDECTOMY    . BREAST BIOPSY Bilateral    negative  . 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 BREAST BIOPSY Left 05/27/2016   neg  . CARDIAC SURGERY    . CATARACT EXTRACTION Right   . CHOLECYSTECTOMY    . COLONOSCOPY WITH PROPOFOL N/A 03/11/2016   Procedure: COLONOSCOPY WITH PROPOFOL;  Surgeon: Gavin Pound  Vira Agar, MD;  Location: [facility] ENDOSCOPY;  Service: Endoscopy;  Laterality: N/A;  . ESOPHAGOGASTRODUODENOSCOPY (EGD) WITH PROPOFOL N/A 03/11/2016   Procedure: ESOPHAGOGASTRODUODENOSCOPY (EGD) WITH PROPOFOL;  Surgeon: Manya Silvas, MD;  Location: [facility] ENDOSCOPY;  Service: Endoscopy;  Laterality: N/A;  . JOINT REPLACEMENT Left    TKR  . left sinusplasty     . MASTECTOMY, PARTIAL Left  10/17/2015   Procedure: MASTECTOMY PARTIAL REVISION;  Surgeon: Leonie Green, MD;  Location: [facility] ORS;  Service: General;  Laterality: Left;  . PARTIAL MASTECTOMY WITH NEEDLE LOCALIZATION Left 09/29/2015   Procedure: PARTIAL MASTECTOMY WITH NEEDLE LOCALIZATION;  Surgeon: Leonie Green, MD;  Location: [facility] ORS;  Service: General;  Laterality: Left;  . TOTAL ABDOMINAL HYSTERECTOMY W/ BILATERAL SALPINGOOPHORECTOMY      FAMILY HISTORY :   Family History  Problem Relation Age of Onset  . Breast cancer Paternal Grandmother   . Colon cancer Father   . Diabetes Sister   . Diabetes Brother   . Heart disease Brother   . Prostate cancer Brother   . Colon cancer Maternal Uncle   . Prostate cancer Brother   . Bladder Cancer Brother   . Leukemia Mother        all  . Ovarian cancer Neg Hx   . Kidney cancer Neg Hx     SOCIAL HISTORY:   Social History   Tobacco Use  . Smoking status: Never Smoker  . Smokeless tobacco: Never Used  Substance Use Topics  . Alcohol use: No    Alcohol/week: 0.0 oz  . Drug use: No    ALLERGIES:  is allergic to fentanyl; codeine; demeclocycline; demerol [meperidine]; hydrocodone-acetaminophen; other; oxycodone; pentazocine; and tetracyclines & related.  MEDICATIONS:  Current Outpatient Medications  Medication Sig Dispense Refill  . aspirin 81 MG tablet Take 81 mg by mouth at bedtime.    . budesonide-formoterol (SYMBICORT) 80-4.5 MCG/ACT inhaler Inhale 2 puffs into the lungs 2 (two) times daily as needed (shortness of breath).     . cholecalciferol (VITAMIN D) 1000 units tablet Take 2,000 Units by mouth daily.     . Cyanocobalamin (B-12 COMPLIANCE INJECTION) 1000 MCG/ML KIT Inject 1 Syringe as directed every 30 (thirty) days.    Marland Kitchen dicyclomine (BENTYL) 10 MG capsule Take 10 mg by mouth 4 (four) times daily -  before meals and at bedtime.    Marland Kitchen escitalopram (LEXAPRO) 5 MG tablet Take 5 mg by mouth at bedtime.    Marland Kitchen esomeprazole (NEXIUM) 40 MG capsule Take  40 mg by mouth daily at 12 noon.     . fenofibrate 160 MG tablet Take 160 mg by mouth at bedtime.    . fexofenadine (ALLEGRA) 180 MG tablet Take 180 mg by mouth at bedtime.     . fluticasone (FLONASE) 50 MCG/ACT nasal spray Place 1 spray into the nose daily as needed for allergies.     . furosemide (LASIX) 20 MG tablet Take 20-40 mg by mouth every other day. Alternate between 1 a day and 2 a day    . gabapentin (NEURONTIN) 300 MG capsule Take 300 mg by mouth daily.    Marland Kitchen glimepiride (AMARYL) 2 MG tablet Take 2 mg by mouth at bedtime.    . hydrOXYzine (ATARAX/VISTARIL) 25 MG tablet 1 or 2 tablets at night for itching and sleep 30 tablet 2  . Hyoscyamine Sulfate 0.375 MG TBCR Take 0.375 mg by mouth 2 (two) times daily.     . Insulin Degludec (TRESIBA FLEXTOUCH [place]) Inject  10 Units into the skin at bedtime.    Marland Kitchen levothyroxine (SYNTHROID, LEVOTHROID) 75 MCG tablet Take 75 mcg by mouth daily before breakfast.     . magnesium oxide (MAG-OX) 400 MG tablet Take by mouth.    . mirabegron ER (MYRBETRIQ) 50 MG TB24 tablet Take 1 tablet (50 mg total) by mouth daily. 30 tablet 3  . MISC NATURAL PRODUCTS PO Take by mouth.    . montelukast (SINGULAIR) 10 MG tablet Take 10 mg by mouth at bedtime.     Marland Kitchen olopatadine (PATANOL) 0.1 % ophthalmic solution 1 drop 2 (two) times daily.    . ondansetron (ZOFRAN) 4 MG tablet Take 4 mg by mouth every 6 (six) hours as needed. for nausea  1  . potassium chloride SA (K-DUR,KLOR-CON) 20 MEQ tablet Take by mouth.    . saccharomyces boulardii (FLORASTOR) 250 MG capsule Take 250 mg by mouth 2 (two) times daily.     . valACYclovir (VALTREX) 500 MG tablet Take 500 mg by mouth 2 (two) times daily.  1  . valsartan (DIOVAN) 160 MG tablet Take 160 mg by mouth every morning.     . benzonatate (TESSALON) 100 MG capsule TAKE 1 CAPSULE BY MOUTH 3 TIMES A DAY AS NEEDED FOR COUGH  1  . fluconazole (DIFLUCAN) 100 MG tablet Take 1 tablet (100 mg total) by mouth daily. (Patient not taking:  Reported on 08/27/2017) 10 tablet 0  . simvastatin (ZOCOR) 20 MG tablet Take 20 mg by mouth at bedtime.     No current facility-administered medications for this visit.     PHYSICAL EXAMINATION: ECOG PERFORMANCE STATUS: 1 - Symptomatic but completely ambulatory  BP 114/64   Pulse 62   Temp 98.9 F (37.2 C) (Tympanic)   Resp 20   LMP  (LMP Unknown)   There were no vitals filed for this visit.  GENERAL: Well-nourished well-developed; Alert, no distress and comfortable.  Alone. EYES: no pallor or icterus OROPHARYNX: no thrush or ulceration; NECK: supple; no lymph nodes felt. LYMPH:  no palpable lymphadenopathy in the axillary or inguinal regions LUNGS: Decreased breath sounds auscultation bilaterally. No wheeze or crackles HEART/CVS: regular rate & rhythm and no murmurs; No lower extremity edema ABDOMEN:abdomen soft, non-tender and normal bowel sounds. No hepatomegaly or splenomegaly.  Musculoskeletal:no cyanosis of digits and no clubbing  PSYCH: alert & oriented x 3 with fluent speech NEURO: no focal motor/sensory deficits SKIN:  no rashes or significant lesions Right and left BREAST exam [in the presence of nurse]- no unusual skin changes or dominant masses felt. Surgical scars noted.      LABORATORY DATA:  I have reviewed the data as listed    Component Value Date/Time   NA 132 (L) 08/27/2017 1337   K 4.0 08/27/2017 1337   CL 101 08/27/2017 1337   CO2 24 08/27/2017 1337   GLUCOSE 334 (H) 08/27/2017 1337   BUN 12 08/27/2017 1337   CREATININE 0.61 08/27/2017 1337   CALCIUM 9.4 08/27/2017 1337   PROT 8.3 (H) 08/27/2017 1337   ALBUMIN 3.8 08/27/2017 1337   AST 48 (H) 08/27/2017 1337   ALT 24 08/27/2017 1337   ALKPHOS 68 08/27/2017 1337   BILITOT 0.8 08/27/2017 1337   GFRNONAA >60 08/27/2017 1337   GFRAA >60 08/27/2017 1337    No results found for: SPEP, UPEP  Lab Results  Component Value Date   WBC 3.0 (L) 08/27/2017   NEUTROABS 1.5 08/27/2017   HGB 10.4 (L)  08/27/2017   HCT  31.3 (L) 08/27/2017   MCV 88.4 08/27/2017   PLT 119 (L) 08/27/2017      Chemistry      Component Value Date/Time   NA 132 (L) 08/27/2017 1337   K 4.0 08/27/2017 1337   CL 101 08/27/2017 1337   CO2 24 08/27/2017 1337   BUN 12 08/27/2017 1337   CREATININE 0.61 08/27/2017 1337      Component Value Date/Time   CALCIUM 9.4 08/27/2017 1337   ALKPHOS 68 08/27/2017 1337   AST 48 (H) 08/27/2017 1337   ALT 24 08/27/2017 1337   BILITOT 0.8 08/27/2017 1337       RADIOGRAPHIC STUDIES: I have personally reviewed the radiological images as listed and agreed with the findings in the report. No results found.   ASSESSMENT & PLAN:  Ductal carcinoma in situ (DCIS) of left breast # # DCIS/papillary carcinoma in situ- Stable.  No clinical evidence of recurrence.  Awaiting mammogram in July.    #Given overall patient's poor tolerance to letrozole [however I suspect most of her symptoms are from recurrent UTI]; I think it is okay to hold off letrozole at this time.   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  Followed by GI.  #Recent UTI/Pyelonephritis-s/p IV Ivanz x 10 days [Dr.Hatcher]; awaiting Abdomen MRI. On flucanazole.   #Diabetes- poorly controlled-blood sugars 330; recommend checking blood sugars more frequently.  #Recommend follow-up in 6 months; labs; could consider starting back on letrozole at the time.  Dr.Tate/GI doc   Orders Placed This Encounter  Procedures  . Comprehensive metabolic panel    Standing Status:   Future    Standing Expiration Date:   08/28/2018  . CBC with Differential    Standing Status:   Future    Standing Expiration Date:   08/28/2018   All questions were answered. The patient knows to call the clinic with any problems, questions or concerns.      Cammie Sickle, MD 08/27/2017 9:11 PM

## 2017-08-27 NOTE — Addendum Note (Signed)
Addended by: Thelbert Gartin C on: 08/27/2017 09:21 AM   Modules accepted: Orders

## 2017-08-27 NOTE — Assessment & Plan Note (Signed)
She continues to need better control of her DM Needs to f/u with her PCP Will be difficult to control her other issues with uncontrolled Glc.

## 2017-08-27 NOTE — Progress Notes (Signed)
   Subjective:    Patient ID: Annette Foster, female    DOB: Jun 15, 1940, 77 y.o.   MRN: [id number]  HPI 77 yo F with recurrent UTI since 12-2017. She has been followed at [facility]  Prev her Cx have grown E coli, with an increasing resistance pattern (s- macrobid, Imipenem/merrem, zosyn, I- augmentin).  Her last rx was macrobid which she started 1 week ago (completes tonight).  She had a previous MRCP which incidentally showed L multifocal pyelonephritis (06-06-17).  She was referred to ID by uro for incomplete rx of UTI and IV anbx.  She was seen in ID on 07-23-17 and was started on invanz. She received this for 11 days (08-07-17).  She had f/u on 7-3 and was to have MRI abd to f/u her previous changes on her CT abd (L upper pole of kidney lesion).  She has not had her MRI and is frustrated that she has not gotten call back from her multiple calls to clinic.  She would like this set up in [place].  She is still having vaginal/urethral discomfort. She also has rectal discomfort now as well.  Has not had rash that she has seen.  Her Glc continue to be "high", 190 this AM.    Review of Systems  Constitutional: Negative for chills and fever.  Gastrointestinal: Positive for rectal pain.  Genitourinary: Positive for difficulty urinating, dysuria, urgency and vaginal pain.   Please see HPI. All other systems reviewed and negative.     Objective:   Physical Exam  Constitutional: She is oriented to person, place, and time. She appears well-developed and well-nourished.  HENT:  Mouth/Throat: No oropharyngeal exudate.  Eyes: Pupils are equal, round, and reactive to light. EOM are normal.  Neck: Normal range of motion. Neck supple.  Cardiovascular: Normal rate, regular rhythm and normal heart sounds.  Pulmonary/Chest: Effort normal and breath sounds normal.  Abdominal: Soft. Bowel sounds are normal. She exhibits no distension. There is no tenderness. There is CVA tenderness.  Musculoskeletal:  Normal range of motion. She exhibits no edema.  Neurological: She is alert and oriented to person, place, and time. No sensory deficit.  Skin: Skin is warm and dry.      Assessment & Plan:

## 2017-08-27 NOTE — Progress Notes (Signed)
Patient here for f/u for DCIS. She states that she was recently tx with UTI and had to have iv antibiotics via picc line.

## 2017-08-29 NOTE — Addendum Note (Signed)
Addended by: Landis Gandy on: 08/29/2017 08:51 AM   Modules accepted: Orders

## 2017-08-30 ENCOUNTER — Ambulatory Visit (HOSPITAL_COMMUNITY): Payer: Medicare Other

## 2017-08-30 ENCOUNTER — Ambulatory Visit (HOSPITAL_COMMUNITY)
Admission: RE | Admit: 2017-08-30 | Discharge: 2017-08-30 | Disposition: A | Discharge status: Home / Self Care | Payer: Medicare Other | Source: Ambulatory Visit | Attending: Infectious Diseases | Admitting: Infectious Diseases

## 2017-08-30 DIAGNOSIS — D1809 Hemangioma of other sites: Secondary | ICD-10-CM | POA: Diagnosis not present

## 2017-08-30 DIAGNOSIS — R161 Splenomegaly, not elsewhere classified: Secondary | ICD-10-CM | POA: Insufficient documentation

## 2017-08-30 DIAGNOSIS — N2889 Other specified disorders of kidney and ureter: Secondary | ICD-10-CM | POA: Insufficient documentation

## 2017-08-30 DIAGNOSIS — N12 Tubulo-interstitial nephritis, not specified as acute or chronic: Secondary | ICD-10-CM | POA: Diagnosis not present

## 2017-08-30 MED ORDER — GADOBENATE DIMEGLUMINE 529 MG/ML IV SOLN
15.0000 mL | Freq: Once | INTRAVENOUS | Status: AC | PRN
  Administered 2017-08-30: 15 mL via INTRAVENOUS

## 2017-09-01 NOTE — Telephone Encounter (Signed)
error 

## 2017-09-11 ENCOUNTER — Encounter: Payer: Self-pay | Admitting: Infectious Diseases

## 2017-09-11 ENCOUNTER — Ambulatory Visit (INDEPENDENT_AMBULATORY_CARE_PROVIDER_SITE_OTHER): Payer: Medicare Other | Admitting: Infectious Diseases

## 2017-09-11 DIAGNOSIS — N390 Urinary tract infection, site not specified: Secondary | ICD-10-CM | POA: Diagnosis not present

## 2017-09-11 DIAGNOSIS — E119 Type 2 diabetes mellitus without complications: Secondary | ICD-10-CM

## 2017-09-11 DIAGNOSIS — Z794 Long term (current) use of insulin: Secondary | ICD-10-CM | POA: Diagnosis not present

## 2017-09-11 MED ORDER — NITROFURANTOIN MONOHYD MACRO 100 MG PO CAPS: 100.0000 mg | ORAL_CAPSULE | Freq: Two times a day (BID) | ORAL | 0 refills | Status: DC

## 2017-09-11 NOTE — Assessment & Plan Note (Addendum)
She continues to be symptomatic.  I am encouraged that her MRI showed resolution of her previous lesion.  Will give her a trail of macrobid to see if sx improve.  If not, will consider another PIC and IV invanz.  Will see her back in 2 months, she can come her or to new ID MD at [facility].

## 2017-09-11 NOTE — Progress Notes (Signed)
   Subjective:    Patient ID: Annette Foster, female    DOB: Mar 21, 1940, 77 y.o.   MRN: [id number]  HPI 77 yo F with recurrent UTI since 12-2017. She has been followed at [facility].  Prev her Cx have grown E coli, with an increasing resistance pattern (s- macrobid, Imipenem/merrem, zosyn, I- augmentin).  Her last rx was macrobid.   She had a previous MRCP which incidentally showed L multifocal pyelonephritis (06-06-17).  She was referred to ID by uro for incomplete rx of UTI and IV anbx. She was seen in ID on 07-23-17 and was started on invanz. She received this for 11 days (08-07-17).  She had f/u on 7-3 and was to have MRI abd to f/u her previous changes on her CT abd (L upper pole of kidney lesion).  She had MRI 7-20 to f/u: 1. No enhancing renal lesion. Resolution of LEFT renal lesions favored to represent focal pyelonephritis. 2. Stable splenomegaly. 3. Several borderline enlarged retroperitoneal periaortic lymph nodes are favored benign reactive. 4. Hemangioma within the lumbar spine.  Is tired this AM after taking husband to [place] last pm for CP. He was adm, she didn't get to sleep until 4am.   Her Glc continue to be "high", 205 this AM.   She continues to have pain in her L CVA- feels like a "catch". She has been taking tylenol ES.   Review of Systems  Constitutional: Negative for chills and fever.  Endocrine: Positive for polyuria.  Genitourinary: Positive for difficulty urinating, dysuria, frequency and hematuria.       Objective:   Physical Exam  Constitutional: She appears well-developed and well-nourished.  HENT:  Mouth/Throat: No oropharyngeal exudate.  Eyes: Pupils are equal, round, and reactive to light. EOM are normal.  Neck: Normal range of motion. Neck supple.  Cardiovascular: Normal rate and regular rhythm.  Murmur heard. Pulmonary/Chest: Effort normal and breath sounds normal.  Abdominal: Soft. Bowel sounds are normal. There is no tenderness. There is CVA tenderness.  There is no rigidity and no guarding.  Musculoskeletal: Normal range of motion. She exhibits no edema.  Lymphadenopathy:    She has no cervical adenopathy.      Assessment & Plan:

## 2017-09-11 NOTE — Assessment & Plan Note (Signed)
Encouraged her to have better control to help improve her urine sx.

## 2017-10-03 ENCOUNTER — Other Ambulatory Visit
Admission: RE | Admit: 2017-10-03 | Discharge: 2017-10-03 | Disposition: A | Discharge status: Home / Self Care | Payer: Medicare Other | Source: Ambulatory Visit | Attending: Student | Admitting: Student

## 2017-10-03 DIAGNOSIS — R197 Diarrhea, unspecified: Secondary | ICD-10-CM | POA: Insufficient documentation

## 2017-10-03 LAB — C DIFFICILE QUICK SCREEN W PCR REFLEX
C Diff antigen: NEGATIVE
C Diff interpretation: NOT DETECTED
C Diff toxin: NEGATIVE

## 2017-10-03 LAB — GASTROINTESTINAL PANEL BY PCR, STOOL (REPLACES STOOL CULTURE)

## 2017-10-08 ENCOUNTER — Telehealth: Payer: Self-pay | Admitting: Infectious Diseases

## 2017-10-08 DIAGNOSIS — N390 Urinary tract infection, site not specified: Secondary | ICD-10-CM

## 2017-10-08 MED ORDER — SULFAMETHOXAZOLE-TRIMETHOPRIM 400-80 MG PO TABS: 1.0000 | ORAL_TABLET | Freq: Two times a day (BID) | ORAL | 1 refills | Status: DC

## 2017-10-08 NOTE — Telephone Encounter (Signed)
called pt back She has been having diarrhea, incontenence. She had stool test that showed EPEC E coli.  She was seen by GI and she had a UCx as well (E coli ESBL S-bactrim, TET, Nitro).  She was given a z-pack by the GI office.  She still has diarrhea.  She still has urine sx. Dysuria. She complains of aggravation and fatigue from the infections.  Will call in 5 days of bactrim.  She will see me in clinic 10-14-17.  She may transfer care to [facility] when Dr Delaine Lame starts her clinic.

## 2017-10-10 LAB — CALPROTECTIN, FECAL: Calprotectin, Fecal: 107 ug/g (ref 0–120)

## 2017-10-14 ENCOUNTER — Encounter: Payer: Self-pay | Admitting: Infectious Diseases

## 2017-10-14 ENCOUNTER — Ambulatory Visit (INDEPENDENT_AMBULATORY_CARE_PROVIDER_SITE_OTHER): Payer: Medicare Other | Admitting: Infectious Diseases

## 2017-10-14 DIAGNOSIS — Z794 Long term (current) use of insulin: Secondary | ICD-10-CM | POA: Diagnosis not present

## 2017-10-14 DIAGNOSIS — E119 Type 2 diabetes mellitus without complications: Secondary | ICD-10-CM | POA: Diagnosis not present

## 2017-10-14 DIAGNOSIS — N390 Urinary tract infection, site not specified: Secondary | ICD-10-CM | POA: Diagnosis not present

## 2017-10-14 NOTE — Assessment & Plan Note (Addendum)
Has had no sx of yeast infections.  She appears to be stable.  Will hold on giving her further anbx now. She has no fever and her flank pain is not reproducible in clinic today.  Would not recheck her UCx unless a clinical change. She agrees to this.  Will see her back prn.

## 2017-10-14 NOTE — Progress Notes (Signed)
   Subjective:    Patient ID: Annette Foster, female    DOB: [birth year], 77 y.o.   MRN: [id number]  HPI 77 yo F with recurrent UTI. She has been followed at [facility].  Prev her Cx have grown E coli, with an increasing resistance pattern (s- macrobid, Imipenem/merrem, zosyn, I- augmentin).  Her last rx was macrobid.   She had a previous MRCP which incidentally showed L multifocal pyelonephritis (06-06-17).  She was referred to ID by uro for incomplete rx of UTI and IV anbx. She was seen in ID on 07-23-17 and was started on invanz. She received this for 11 days (08-07-17). She had f/u on 7-3 and was to have MRI abd to f/u her previous changes on her CT abd (L upper pole of kidney lesion).  She had MRI 7-20 to f/u: 1. No enhancing renal lesion. Resolution of LEFT renal lesions favored to represent focal pyelonephritis. 2. Stable splenomegaly. 3. Several borderline enlarged retroperitoneal periaortic lymph nodes are favored benign reactive. 4. Hemangioma within the lumbar spine.  She was seen in ID on 09-11-17 and kept off anbx. She was then seen by GI at [facility] for diarrhea. She had EPEC e coli in her stool and they also did UCx which showed ESBL E coli. She was given z-pack by GI. I called in 5 days of bactrim for her as well.   Today she states she does not feel better.  She has dysuria and frequency and urgency.  Still having flank pain.  This has not improved with antibiotics.   Her husband continues to have CV issues.   Review of Systems  Constitutional: Negative for chills and fever.  Genitourinary: Positive for dysuria, flank pain and frequency.  she continues to have intermittent diarrhea.  Please see HPI. All other systems reviewed and negative.      Objective:   Physical Exam  Constitutional: She appears well-developed and well-nourished.  HENT:  Mouth/Throat: No oropharyngeal exudate.  Eyes: Pupils are equal, round, and reactive to light. EOM are normal.  Neck: Normal  range of motion. Neck supple.  Cardiovascular: Normal rate and regular rhythm.  Murmur heard. Pulmonary/Chest: Effort normal and breath sounds normal.  Abdominal: Soft. Bowel sounds are normal. She exhibits no distension. There is no tenderness. There is no CVA tenderness.  Musculoskeletal: She exhibits no edema.       Assessment & Plan:

## 2017-10-14 NOTE — Assessment & Plan Note (Addendum)
FSG have been 130s  High was 165, improved vs previous.  Has improved her diet. Walking daily.  Trying to get off insulin.

## 2017-10-17 ENCOUNTER — Other Ambulatory Visit: Payer: Self-pay | Admitting: General Surgery

## 2017-10-17 ENCOUNTER — Ambulatory Visit: Payer: Medicare Other | Admitting: Infectious Diseases

## 2017-10-17 DIAGNOSIS — Z853 Personal history of malignant neoplasm of breast: Secondary | ICD-10-CM

## 2017-10-20 ENCOUNTER — Ambulatory Visit (INDEPENDENT_AMBULATORY_CARE_PROVIDER_SITE_OTHER): Payer: Medicare Other | Admitting: Podiatry

## 2017-10-20 ENCOUNTER — Encounter: Payer: Self-pay | Admitting: Podiatry

## 2017-10-20 DIAGNOSIS — M79675 Pain in left toe(s): Secondary | ICD-10-CM

## 2017-10-20 DIAGNOSIS — E1142 Type 2 diabetes mellitus with diabetic polyneuropathy: Secondary | ICD-10-CM | POA: Diagnosis not present

## 2017-10-20 DIAGNOSIS — B351 Tinea unguium: Secondary | ICD-10-CM | POA: Diagnosis not present

## 2017-10-20 DIAGNOSIS — M79674 Pain in right toe(s): Secondary | ICD-10-CM | POA: Diagnosis not present

## 2017-10-20 NOTE — Progress Notes (Signed)
Complaint:  Visit Type: Patient returns to my office for continued preventative foot care services. Complaint: Patient states" my nails have grown long and thick and become painful to walk and wear shoes" Patient has been diagnosed with DM with no foot complications. The patient presents for preventative foot care services. No changes to ROS  Podiatric Exam: Vascular: dorsalis pedis and posterior tibial pulses are palpable bilateral. Capillary return is immediate. Temperature gradient is WNL. Skin turgor WNL  Sensorium: Normal Semmes Weinstein monofilament test. Normal tactile sensation bilaterally. Nail Exam: Pt has thick disfigured discolored nails with subungual debris noted bilateral entire nail hallux through fifth toenails.  Right hallux nail plate is absent.  Surgery has healed. Ulcer Exam: There is no evidence of ulcer or pre-ulcerative changes or infection. Orthopedic Exam: Muscle tone and strength are WNL. No limitations in general ROM. No crepitus or effusions noted. Foot type and digits show no abnormalities. Bony prominences are unremarkable. Skin: No Porokeratosis. No infection or ulcers  Diagnosis:  Onychomycosis, , Pain in right toe, pain in left toes  Treatment & Plan Procedures and Treatment: Consent by patient was obtained for treatment procedures. The patient understood the discussion of treatment and procedures well. All questions were answered thoroughly reviewed. Debridement of mycotic and hypertrophic toenails, 1 through 5 bilateral and clearing of subungual debris. No ulceration, no infection noted.   Return Visit-Office Procedure: Patient instructed to return to the office for a follow up visit 4 months for continued evaluation and treatment.    Dymon Summerhill DPM 

## 2017-10-21 LAB — PANCREATIC ELASTASE, FECAL: Pancreatic Elastase-1, Stool: >500 ug Elast./g (ref >200)

## 2017-10-30 ENCOUNTER — Encounter: Payer: Self-pay | Admitting: Obstetrics and Gynecology

## 2017-10-30 ENCOUNTER — Ambulatory Visit (INDEPENDENT_AMBULATORY_CARE_PROVIDER_SITE_OTHER): Payer: Medicare Other | Admitting: Obstetrics and Gynecology

## 2017-10-30 VITALS — BP 144/61 | HR 68 | Ht 63.0 in | Wt 170.4 lb

## 2017-10-30 DIAGNOSIS — L308 Other specified dermatitis: Secondary | ICD-10-CM

## 2017-10-30 DIAGNOSIS — L408 Other psoriasis: Secondary | ICD-10-CM | POA: Diagnosis not present

## 2017-10-30 DIAGNOSIS — N390 Urinary tract infection, site not specified: Secondary | ICD-10-CM | POA: Diagnosis not present

## 2017-10-30 DIAGNOSIS — N763 Subacute and chronic vulvitis: Secondary | ICD-10-CM | POA: Diagnosis not present

## 2017-10-30 LAB — POCT URINALYSIS DIPSTICK
Bilirubin, UA: NEGATIVE
Glucose, UA: POSITIVE — AB
Ketones, UA: NEGATIVE
Nitrite, UA: NEGATIVE
Protein, UA: POSITIVE — AB
Spec Grav, UA: 1.02 (ref 1.010–1.025)
Urobilinogen, UA: 0.2 E.U./dL
pH, UA: 6 (ref 5.0–8.0)

## 2017-10-30 NOTE — Progress Notes (Signed)
Chief complaint: 1.  Recurrent UTI 2.  Chronic vulvitis  Annette Foster presents today for follow-up.  Last visit was 07/31/2017.  At that visit she was treated with Diflucan for monilial vulvitis thought to be induced by IV antibiotics for recurrent UTIs.  She was using Desitin ointment to perianal region for protection against her chronic diarrhea related to the antibiotic usage. 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 She is now status post Flagyl therapy for UTI; but follow-up culture has demonstrated persistent E. coli infection; she is to be retreated with Flagyl this week.  If her UTI cannot be eradicated, PICC line may be reinserted.  Past medical history, past surgical history, problem list, medications, and allergies are reviewed  OBJECTIVE: BP (!) 144/61   Pulse 68   Ht 5' 3"  (1.6 m)   Wt 170 lb 6.4 oz (77.3 kg)   LMP  (LMP Unknown)   BMI 30.19 kg/m  Pleasant female no acute distress.  Alert and oriented.  Affect is appropriate. Back: No CVA tenderness Abdomen: Soft, nontender without organomegaly Bladder: Nontender Pelvic: External genitalia-atrophic changes; faint leukoplakia present bilaterally on labia majora; epithelial skin cracking noted at the clitoral hood; 5 mm ulceration noted on left labia majora BUS-normal Vagina-atrophic changes without discharge Bimanual-not done Rectovaginal-external hemorrhoids present; minimal perianal hyperemia without ulceration Extremities-PICC line removed, incision healed  ASSESSMENT: 1.  Recurrent UTI, E. coli, undergoing treatment 2.  Chronic vulvitis/psoriasiform dermatitis, with increased symptomatology 3.  Chronic diarrhea intermittent without significant perianal inflammation at this time  PLAN: 1.  Increase Temovate ointment 0.05%  topically to the vulva twice a week 2.  Continue blood drying perineum 3.  Continue using Desitin to help with chronic irritation from diarrhea 4.  Return in 3 months for follow-up 5.  Patient is to follow-up with internal medicine for further treatment of recurrent E. coli infection.  A total of 15 minutes were spent face-to-face with the patient during this encounter and over half of that time dealt with counseling and coordination of care.  Brayton Mars, MD  Note: This dictation was prepared with Dragon dictation along with smaller phrase technology. Any transcriptional errors that result from this process are unintentional.

## 2017-10-30 NOTE — Patient Instructions (Signed)
1.  Continue with Temovate ointment 0.05% topically to the vulva twice a week 2.  Continue blood drying the perineum with bathing 3.  Continue using Desitin ointment topically to the perineum to help avoid irritation from chronic diarrhea 4.  Urinalysis today showed blood and white blood cells in the urine; UTI is to be retreated per internal medicine 5.  Return in 3 months for follow-up

## 2017-11-01 LAB — URINE CULTURE

## 2017-11-03 ENCOUNTER — Encounter: Payer: Self-pay | Admitting: Internal Medicine

## 2017-11-03 ENCOUNTER — Ambulatory Visit (INDEPENDENT_AMBULATORY_CARE_PROVIDER_SITE_OTHER): Payer: Medicare Other | Admitting: Internal Medicine

## 2017-11-03 DIAGNOSIS — Z23 Encounter for immunization: Secondary | ICD-10-CM

## 2017-11-03 DIAGNOSIS — N3010 Interstitial cystitis (chronic) without hematuria: Secondary | ICD-10-CM

## 2017-11-03 DIAGNOSIS — N390 Urinary tract infection, site not specified: Secondary | ICD-10-CM

## 2017-11-03 NOTE — Progress Notes (Signed)
   Subjective:    Patient ID: Annette Foster, female    DOB: 11-08-1940, 77 y.o.   MRN: [id number]  HPI She comes in for a work in visit regarding her concern for a urinary tract infection. She has a history of bladder symptoms including frequency, painful bladder, uncomfortable sitting, vulvitis, as well as associated diarrhea.  She has been seen by multiple providers including 3 urologists, gynecology, internal medicine and others for the same issue and has been on by her count 11 courses of antibiotics including oral cephalosporins, IV ertapenem, fosfomycin, fluoroquinolones and essentially over the last year and a half has had no relief despite these long courses of antibiotics.  She has persistent growth of E. coli in her urine that is now resistant and is an ESBL.  She saw her gynecologist who again checked another urine culture showing the same E. coli.  She is hoping for some treatment for her symptoms.  Her urinary symptoms have not been associated with a fever or leukocytosis except when she was diagnosed with pyelonephritis noted on an MRI.  She otherwise has had no systemic symptoms related to most of these reported infections.  She is unaware of other diagnoses such as interstitial cystitis.  She is unaware of any bladder training or exercises.  She has tried bladder pain medications with little benefit.   Review of Systems  Constitutional: Negative for fatigue.  Gastrointestinal: Negative for diarrhea.  Skin: Negative for rash.       Objective:   Physical Exam  Constitutional: She appears well-developed and well-nourished.  HENT:  Mouth/Throat: No oropharyngeal exudate.  Eyes: No scleral icterus.  Cardiovascular: Normal rate, regular rhythm and normal heart sounds.  No murmur heard. Pulmonary/Chest: Effort normal and breath sounds normal. No respiratory distress.  Skin: No rash noted.   SH: no tobacco       Assessment & Plan:

## 2017-11-03 NOTE — Assessment & Plan Note (Addendum)
This is a new diagnosis.  I discussed her last 18 months with her including how she has had multiple courses of antibiotics including appropriate antibiotics that should have treated her symptoms if it was a true infection and at this point there is no indication for further antibiotics.  I discussed the symptoms are noninfectious and though bothersome, further antibiotics and no benefit.  This is evidenced by multiple courses of antibiotics that have had no benefit and unfortunately, there is no particular treatment for this.  I did suggest returning to urology for help with interstitial cystitis and I have referred her to our local urologist for evaluation.  She understands that treatment will be aimed at reducing symptoms.  I answered all questions and I have sent the referral to urology.

## 2017-11-03 NOTE — Assessment & Plan Note (Signed)
I do see that she has had some infections in the past however her symptoms having been treated multiple times clearly are mainly noninfectious and further antibiotics outside of fever and leukocytosis with no other etiology would be a trigger for further antibiotics but otherwise are not indicated.

## 2017-11-04 ENCOUNTER — Ambulatory Visit
Admission: RE | Admit: 2017-11-04 | Discharge: 2017-11-04 | Disposition: A | Discharge status: Home / Self Care | Payer: Medicare Other | Source: Ambulatory Visit | Attending: General Surgery | Admitting: General Surgery

## 2017-11-04 DIAGNOSIS — Z853 Personal history of malignant neoplasm of breast: Secondary | ICD-10-CM

## 2017-11-05 ENCOUNTER — Other Ambulatory Visit: Payer: Self-pay | Admitting: General Surgery

## 2017-11-05 DIAGNOSIS — R921 Mammographic calcification found on diagnostic imaging of breast: Secondary | ICD-10-CM

## 2017-11-05 DIAGNOSIS — R928 Other abnormal and inconclusive findings on diagnostic imaging of breast: Secondary | ICD-10-CM

## 2017-11-10 ENCOUNTER — Telehealth: Payer: Self-pay

## 2017-11-10 MED ORDER — NITROFURANTOIN MONOHYD MACRO 100 MG PO CAPS: 100.0000 mg | ORAL_CAPSULE | Freq: Two times a day (BID) | ORAL | 0 refills | Status: DC

## 2017-11-10 NOTE — Telephone Encounter (Signed)
-----   Message from Brayton Mars, MD sent at 11/10/2017  9:57 AM EDT ----- Please notify - Abnormal Labs Please contact patient and see if UTI was successfully treated by internal medicine. Her culture was positive for E. coli which is sensitive to nitrofurantoin.  If she is symptomatic I would recommend she be given Macrobid twice a day for 7 days.

## 2017-11-10 NOTE — Telephone Encounter (Signed)
Pt aware. She was not treated at infectious disease. 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 Med erx. Pt states she has been sick with diarrhea since yesterday. Found out on Friday that she may have breast cancer again. Pt has an appt with urology next week.

## 2017-11-14 ENCOUNTER — Ambulatory Visit
Admission: RE | Admit: 2017-11-14 | Discharge: 2017-11-14 | Disposition: A | Discharge status: Home / Self Care | Payer: Medicare Other | Source: Ambulatory Visit | Attending: Internal Medicine | Admitting: Internal Medicine

## 2017-11-14 ENCOUNTER — Other Ambulatory Visit: Payer: Self-pay | Admitting: Internal Medicine

## 2017-11-14 DIAGNOSIS — I517 Cardiomegaly: Secondary | ICD-10-CM | POA: Insufficient documentation

## 2017-11-14 DIAGNOSIS — R918 Other nonspecific abnormal finding of lung field: Secondary | ICD-10-CM | POA: Insufficient documentation

## 2017-11-14 DIAGNOSIS — R042 Hemoptysis: Secondary | ICD-10-CM | POA: Diagnosis present

## 2017-11-14 DIAGNOSIS — R05 Cough: Secondary | ICD-10-CM

## 2017-11-14 DIAGNOSIS — R059 Cough, unspecified: Secondary | ICD-10-CM

## 2017-11-20 ENCOUNTER — Ambulatory Visit
Admission: RE | Admit: 2017-11-20 | Discharge: 2017-11-20 | Disposition: A | Discharge status: Home / Self Care | Payer: Medicare Other | Source: Ambulatory Visit | Attending: General Surgery | Admitting: General Surgery

## 2017-11-20 DIAGNOSIS — R921 Mammographic calcification found on diagnostic imaging of breast: Secondary | ICD-10-CM | POA: Diagnosis present

## 2017-11-20 DIAGNOSIS — R928 Other abnormal and inconclusive findings on diagnostic imaging of breast: Secondary | ICD-10-CM

## 2017-11-20 HISTORY — PX: BREAST BIOPSY: SHX20

## 2017-11-21 ENCOUNTER — Ambulatory Visit (INDEPENDENT_AMBULATORY_CARE_PROVIDER_SITE_OTHER): Payer: Medicare Other | Admitting: Urology

## 2017-11-21 ENCOUNTER — Encounter: Payer: Self-pay | Admitting: Urology

## 2017-11-21 ENCOUNTER — Encounter

## 2017-11-21 VITALS — BP 141/69 | HR 66 | Ht 63.0 in | Wt 165.0 lb

## 2017-11-21 DIAGNOSIS — R300 Dysuria: Secondary | ICD-10-CM

## 2017-11-21 DIAGNOSIS — N12 Tubulo-interstitial nephritis, not specified as acute or chronic: Secondary | ICD-10-CM | POA: Diagnosis not present

## 2017-11-21 DIAGNOSIS — N390 Urinary tract infection, site not specified: Secondary | ICD-10-CM | POA: Diagnosis not present

## 2017-11-21 DIAGNOSIS — N3941 Urge incontinence: Secondary | ICD-10-CM

## 2017-11-21 MED ORDER — URIBEL 118 MG PO CAPS: 1.0000 | ORAL_CAPSULE | Freq: Four times a day (QID) | ORAL | 1 refills | Status: DC | PRN

## 2017-11-21 NOTE — Progress Notes (Signed)
11/21/2017 1:57 PM   Annette Foster 03-22-1940 [id number]  Referring provider: Albina Billet, MD [address]   [place], [place] [address]  Chief Complaint  Patient presents with  . Cystitis    HPI: 77 year old female with personal history of urinary tract infection who returns today to discuss her ongoing urinary symptoms.    She was most recently referred to infectious disease for treatment of her tract infection, ESBL E. Coli.  She received IV Invanz.  Unfortunately, her urinary symptoms failed to resolve her infection.  She was told that she has interstitial cystitis and needs no further antibiotics.   She managed by Dr. Enzo Bi for chronic vulvitis.  She was recently seen and he mentioned cystoscopy.  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  She also had a subtle perfusion abnormality in the left upper pole, possible pyelonephritis on CT abdomen pelvis with contrast 05/2017.  This was appropriately followed up with an MRI on 08/30/2016 which showed resolution of the left renal lesion, favor to be a focal pyelonephritis.  She had some borderline enlarged retroperitoneal for reactive benign.  She has a personal history of breast cancer but does not estrogen cream.  She is on Mybetriq 50 mg for urinary frequency and urge incontinence.  She is not sure if this is helping or not.  She still has episodes of gross incontinence which she arrives at home before she can get in the house.    Her symptoms have been going on for 20 months.  She continues to have burning primarily associated with voiding.  She has been using Pyridium intermittently as needed which does not really help.      PMH: Past Medical History:  Diagnosis Date  . Abdominal pain   . Allergy   . Cancer [facility]) 2017   breast cancer- Left  . Cataract   . CHF (congestive heart failure) ([place])   .  Collagenous colitis   . Diabetes mellitus without complication ([place])   . Diarrhea   . Diverticulosis   . Fatty liver disease, nonalcoholic   . Fibrocystic breast   . GERD (gastroesophageal reflux disease)   . Heart murmur   . Hyperlipidemia   . Hypertension   . Hypothyroidism   . IBS (irritable bowel syndrome)   . IBS (irritable bowel syndrome)   . IDA (iron deficiency anemia)   . Personal history of radiation therapy   . PONV (postoperative nausea and vomiting)   . Sleep apnea    C-Pap    Surgical History: Past Surgical History:  Procedure Laterality Date  . ABDOMINAL HYSTERECTOMY     [facility]  . ABDOMINAL SURGERY    . APPENDECTOMY    . BREAST BIOPSY Bilateral 2016   negative  . 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 BREAST BIOPSY Left 05/27/2016   neg  . BREAST BIOPSY Left 11/20/2017   affirm bx x clip path pending  . BREAST LUMPECTOMY Left 10/17/2015  . CARDIAC SURGERY    . CATARACT EXTRACTION Right   . CHOLECYSTECTOMY    . COLONOSCOPY WITH PROPOFOL N/A 03/11/2016   Procedure: COLONOSCOPY WITH PROPOFOL;  Surgeon: Manya Silvas, MD;  Location[facility] ENDOSCOPY;  Service: Endoscopy;  Laterality: N/A;  . ESOPHAGOGASTRODUODENOSCOPY (EGD) WITH PROPOFOL N/A 03/11/2016   Procedure: ESOPHAGOGASTRODUODENOSCOPY (EGD) WITH PROPOFOL;  Surgeon: Manya Silvas, MD;  Location: [facility] ENDOSCOPY;  Service: Endoscopy;  Laterality: N/A;  . JOINT REPLACEMENT Left    TKR  . left sinusplasty     . MASTECTOMY, PARTIAL Left 10/17/2015   Procedure: MASTECTOMY PARTIAL REVISION;  Surgeon: Leonie Green, MD;  Location: [facility] ORS;  Service: General;  Laterality: Left;  . PARTIAL MASTECTOMY WITH NEEDLE LOCALIZATION Left 09/29/2015   Procedure: PARTIAL MASTECTOMY WITH NEEDLE LOCALIZATION;  Surgeon: Leonie Green, MD;  Location: [facility] ORS;  Service: General;  Laterality: Left;  . TOTAL ABDOMINAL HYSTERECTOMY W/ BILATERAL SALPINGOOPHORECTOMY      Home  Medications:  Allergies as of 11/21/2017      Reactions   Fentanyl Shortness Of Breath, Itching, Nausea And Vomiting   Codeine Itching, Nausea And Vomiting   Demeclocycline Other (See Comments), Rash   Demerol [meperidine] Itching, Nausea And Vomiting   Hydrocodone-acetaminophen Itching, Nausea And Vomiting   Other Other (See Comments)   Oxycodone Itching, Nausea And Vomiting   Pentazocine Other (See Comments)   Tetracyclines & Related Rash      Medication List        Accurate as of 11/21/17  1:57 PM. Always use your most recent med list.          aspirin 81 MG tablet Take 81 mg by mouth at bedtime.   B-12 COMPLIANCE INJECTION 1000 MCG/ML Kit Generic drug:  Cyanocobalamin Inject 1 Syringe as directed every 30 (thirty) days.   benzonatate 100 MG capsule Commonly known as:  TESSALON TAKE 1 CAPSULE BY MOUTH 3 TIMES A DAY AS NEEDED FOR COUGH   budesonide-formoterol 80-4.5 MCG/ACT inhaler Commonly known as:  SYMBICORT Inhale 2 puffs into the lungs 2 (two) times daily as needed (shortness of breath).   cholecalciferol 1000 units tablet Commonly known as:  VITAMIN D Take 2,000 Units by mouth daily.   dicyclomine 10 MG capsule Commonly known as:  BENTYL Take 10 mg by mouth 4 (four) times daily -  before meals and at bedtime.   escitalopram 5 MG tablet Commonly known as:  LEXAPRO Take 5 mg by mouth at bedtime.   esomeprazole 40 MG capsule Commonly known as:  NEXIUM Take 40 mg by mouth daily at 12 noon.   fenofibrate 160 MG tablet Take 160 mg by mouth at bedtime.   fexofenadine 180 MG tablet Commonly known as:  ALLEGRA Take 180 mg by mouth at bedtime.   fluticasone 50 MCG/ACT nasal spray Commonly known as:  FLONASE Place 1 spray into the nose daily as needed for allergies.   furosemide 20 MG tablet Commonly known as:  LASIX Take 20-40 mg by mouth every other day. Alternate between 1 a day and 2 a day   gabapentin 300 MG capsule Commonly known as:   NEURONTIN Take 300 mg by mouth daily.   glimepiride 2 MG tablet Commonly known as:  AMARYL Take 2 mg by mouth at bedtime.   hydrOXYzine 25 MG tablet Commonly known as:  ATARAX/VISTARIL 1 or 2 tablets at night for itching and sleep   Hyoscyamine Sulfate 0.375 MG Tbcr Take 0.375 mg by mouth 2 (two) times daily.   levothyroxine 75 MCG tablet Commonly known as:  SYNTHROID, LEVOTHROID Take 75 mcg by mouth daily before breakfast.   magnesium oxide 400 MG tablet Commonly known as:  MAG-OX Take by mouth.   mirabegron ER 50 MG Tb24 tablet Commonly known as:  MYRBETRIQ Take 1 tablet (50 mg total) by mouth daily.   MISC NATURAL PRODUCTS PO Take by mouth.  montelukast 10 MG tablet Commonly known as:  SINGULAIR Take 10 mg by mouth at bedtime.   olopatadine 0.1 % ophthalmic solution Commonly known as:  PATANOL 1 drop 2 (two) times daily.   ondansetron 4 MG tablet Commonly known as:  ZOFRAN Take 4 mg by mouth every 6 (six) hours as needed. for nausea   potassium chloride SA 20 MEQ tablet Commonly known as:  K-DUR,KLOR-CON Take by mouth.   saccharomyces boulardii 250 MG capsule Commonly known as:  FLORASTOR Take 250 mg by mouth 2 (two) times daily.   simvastatin 20 MG tablet Commonly known as:  ZOCOR Take 20 mg by mouth at bedtime.   TRESIBA FLEXTOUCH [place] Inject 10 Units into the skin at bedtime.   URIBEL 118 MG Caps Take 1 capsule (118 mg total) by mouth 4 (four) times daily as needed.   valACYclovir 500 MG tablet Commonly known as:  VALTREX Take 500 mg by mouth 2 (two) times daily.   valsartan 160 MG tablet Commonly known as:  DIOVAN Take 160 mg by mouth every morning.       Allergies:  Allergies  Allergen Reactions  . Fentanyl Shortness Of Breath, Itching and Nausea And Vomiting  . Codeine Itching and Nausea And Vomiting  . Demeclocycline Other (See Comments) and Rash  . Demerol [Meperidine] Itching and Nausea And Vomiting  . Hydrocodone-Acetaminophen  Itching and Nausea And Vomiting  . Other Other (See Comments)  . Oxycodone Itching and Nausea And Vomiting  . Pentazocine Other (See Comments)  . Tetracyclines & Related Rash    Family History: Family History  Problem Relation Age of Onset  . Breast cancer Paternal Grandmother   . Colon cancer Father   . Diabetes Sister   . Diabetes Brother   . Heart disease Brother   . Prostate cancer Brother   . Colon cancer Maternal Uncle   . Prostate cancer Brother   . Bladder Cancer Brother   . Leukemia Mother        all  . Ovarian cancer Neg Hx   . Kidney cancer Neg Hx     Social History:  reports that she has never smoked. She has never used smokeless tobacco. She reports that she does not drink alcohol or use drugs.  ROS: UROLOGY Frequent Urination?: Yes Hard to postpone urination?: Yes Burning/pain with urination?: Yes Get up at night to urinate?: Yes Leakage of urine?: Yes Urine stream starts and stops?: No Trouble starting stream?: No Do you have to strain to urinate?: No Blood in urine?: Yes Urinary tract infection?: Yes Sexually transmitted disease?: No Injury to kidneys or bladder?: No Painful intercourse?: No Weak stream?: No Currently pregnant?: No Vaginal bleeding?: Yes Last menstrual period?: n  Gastrointestinal Nausea?: No Vomiting?: No Indigestion/heartburn?: No Diarrhea?: Yes Constipation?: No  Constitutional Fever: No Night sweats?: No Weight loss?: No Fatigue?: Yes  Skin Skin rash/lesions?: No Itching?: No  Eyes Blurred vision?: No Double vision?: No  Ears/Nose/Throat Sore throat?: No Sinus problems?: No  Hematologic/Lymphatic Swollen glands?: No Easy bruising?: Yes  Cardiovascular Leg swelling?: No Chest pain?: No  Respiratory Cough?: Yes Shortness of breath?: No  Endocrine Excessive thirst?: Yes  Musculoskeletal Back pain?: No Joint pain?: No  Neurological Headaches?: No Dizziness?: No  Psychologic Depression?:  No Anxiety?: No  Physical Exam: BP (!) 141/69   Pulse 66   Ht 5' 3"  (1.6 m)   Wt 165 lb (74.8 kg)   LMP  (LMP Unknown)   BMI 29.23 kg/m  Constitutional:  Alert and oriented, No acute distress. HEENT: [place] AT, moist mucus membranes.  Trachea midline, no masses. Cardiovascular: No clubbing, cyanosis, or edema. Respiratory: Normal respiratory effort, no increased work of breathing. Skin: No rashes, bruises or suspicious lesions. Neurologic: Grossly intact, no focal deficits, moving all 4 extremities. Psychiatric: Normal mood and affect.  Laboratory Data: Lab Results  Component Value Date   WBC 3.0 (L) 08/27/2017   HGB 10.4 (L) 08/27/2017   HCT 31.3 (L) 08/27/2017   MCV 88.4 08/27/2017   PLT 119 (L) 08/27/2017    Lab Results  Component Value Date   CREATININE 0.61 08/27/2017   Urinalysis Na  Pertinent Imaging: NA  Assessment & Plan:    1. Recurrent UTI Previously followed by infectious disease on IV antibiotics for ESBL E. coli Despite treatment, her symptoms persist which is somewhat concerning for possible concomitant underlying condition I do agree that cystoscopy is a reasonable option to rule out any underlying bladder pathology She is agreeable to plan  2. Pyelonephritis Resolved on most recent imaging  3. Dysuria Persistent dysuria Urine cytology today Cystoscopy as above Prescribed Uribel as needed which she can take continuously safely  4. Urge incontinence Continue Myrbetriq 50 mg   Return for cysto next availible.  Hollice Espy, MD  [facility] [address], [place], [place] [address] [phone]  I spent 25 min with this patient of which greater than 50% was spent in counseling and coordination of care with the patient.

## 2017-11-24 ENCOUNTER — Telehealth: Payer: Self-pay | Admitting: Urology

## 2017-11-24 LAB — SURGICAL PATHOLOGY

## 2017-11-24 NOTE — Telephone Encounter (Signed)
Pt stopped by office and said she went to get Uribel RX and it was $469.  Pt can't afford to pay this.  Please give her a call.

## 2017-11-25 MED ORDER — PHENAZOPYRIDINE HCL 95 MG PO TABS: 95.0000 mg | ORAL_TABLET | Freq: Three times a day (TID) | ORAL | 0 refills | Status: DC | PRN

## 2017-11-25 NOTE — Telephone Encounter (Signed)
Pyridium was sent to pharmacy for patient to try.

## 2017-11-26 ENCOUNTER — Ambulatory Visit: Payer: Medicare Other | Admitting: Infectious Diseases

## 2017-11-27 ENCOUNTER — Other Ambulatory Visit: Payer: Self-pay | Admitting: Urology

## 2017-12-05 ENCOUNTER — Encounter: Payer: Self-pay | Admitting: Urology

## 2017-12-05 ENCOUNTER — Ambulatory Visit (INDEPENDENT_AMBULATORY_CARE_PROVIDER_SITE_OTHER): Payer: Medicare Other | Admitting: Urology

## 2017-12-05 ENCOUNTER — Other Ambulatory Visit: Payer: Self-pay

## 2017-12-05 ENCOUNTER — Other Ambulatory Visit
Admission: RE | Admit: 2017-12-05 | Discharge: 2017-12-05 | Disposition: A | Discharge status: Home / Self Care | Payer: Medicare Other | Source: Ambulatory Visit | Attending: Urology | Admitting: Urology

## 2017-12-05 VITALS — BP 145/67 | HR 65 | Ht 63.0 in | Wt 165.0 lb

## 2017-12-05 DIAGNOSIS — N763 Subacute and chronic vulvitis: Secondary | ICD-10-CM

## 2017-12-05 DIAGNOSIS — N390 Urinary tract infection, site not specified: Secondary | ICD-10-CM | POA: Insufficient documentation

## 2017-12-05 DIAGNOSIS — N3281 Overactive bladder: Secondary | ICD-10-CM

## 2017-12-05 DIAGNOSIS — N952 Postmenopausal atrophic vaginitis: Secondary | ICD-10-CM

## 2017-12-05 DIAGNOSIS — R300 Dysuria: Secondary | ICD-10-CM

## 2017-12-05 LAB — URINALYSIS, COMPLETE (UACMP) WITH MICROSCOPIC
Bilirubin Urine: NEGATIVE
Glucose, UA: 250 mg/dL — AB
Hgb urine dipstick: NEGATIVE
Ketones, ur: NEGATIVE mg/dL
Leukocytes, UA: NEGATIVE
Nitrite: POSITIVE — AB
Protein, ur: NEGATIVE mg/dL
RBC / HPF: NONE SEEN RBC/hpf (ref 0–5)
Specific Gravity, Urine: <1.005 — ABNORMAL LOW (ref 1.005–1.030)
pH: 6 (ref 5.0–8.0)

## 2017-12-05 NOTE — Progress Notes (Signed)
   12/07/17  CC:  Chief Complaint  Patient presents with  . Cysto    HPI: 77 year old female with recurrent urinary tract infections as well as chronic irritative voiding symptoms including dysuria and frequency who presents today for cystoscopy for further evaluation of this.  Please see previous notes for details.  She has been seen and managed by infectious disease for chronic ESBL infection without improvement in her urinary symptoms.  She also has chronic vulvitis managed by Dr. Enzo Bi.  Since last visit, she was prescribed Uribel when she went to fill the prescription, was exorbitantly expensive thus was not able to purchase it.  She did not yet try this medication.  She was prescribed Azo as an alternative and has been using this without significant improvement.  Currently also on Myrbetriq 50 mg but has not seen improvement in her urinary frequency and urgency.  Blood pressure (!) 145/67, pulse 65, height 5' 3"  (1.6 m), weight 165 lb (74.8 kg). NED. A&Ox3.   No respiratory distress   Abd soft, NT, ND Chronicly inflamed vulva appreciated, erythematous.  Normal urethral meatus.  Vaginal atrophy appreciated.  Cystoscopy Procedure Note  Patient identification was confirmed, informed consent was obtained, and patient was prepped using Betadine solution.  Lidocaine jelly was administered per urethral meatus.    Procedure: - Flexible cystoscope introduced, without any difficulty.   - Thorough search of the bladder revealed:    normal urethral meatus    normal urothelium    no stones    no ulcers     no tumors    no urethral polyps    Mild trabeculation  - Ureteral orifices were normal in position and appearance.  Post-Procedure: - Patient tolerated the procedure well  Assessment/ Plan:  1. Dysuria Cystoscopy today is unremarkable Previous urine cytology also negative I suspect her dysuria is multifactorial including irritation of her vulva and atrophic vaginal  tissue by acidic urine in addition to primary dysuria I have encouraged her to buy a few of the Uribel tablets, not the entire prescription to see if this is more effective than Pyridium If she continues to use Pyridium, she was advised to use no longer than 2 to 3 days and then take a break from the medication  2. Recurrent UTI UA today is relatively bland, nitrate positive likely secondary to Pyridium use We will send culture to see if she continues to grow E. coli - Urine Culture; Future  3. Atrophic vaginitis We will check with Dr. Rogue Bussing to see if she is a candidate for a non-estrogen-containing product for her atrophy such as Osphena, [place] given her personal history of breast cancer Patient is pleased to find that she does not in fact have evidence of recurrent disease  4. Chronic vulvitis Managed by Dr. Enzo Bi  5. OAB (overactive bladder) Continue Myrbetriq 50 We will discuss additional treatment options for OAB symptoms at her next follow-up    Return in about 6 weeks (around 01/16/2018) for discuss OAB symptoms.  Hollice Espy, MD

## 2017-12-07 LAB — URINE CULTURE: Culture: <10000 — AB

## 2017-12-11 ENCOUNTER — Telehealth: Payer: Self-pay | Admitting: Urology

## 2017-12-11 MED ORDER — PRASTERONE 6.5 MG VA INST: 1.0000 | VAGINAL_INSERT | Freq: Every day | VAGINAL | 11 refills | Status: DC

## 2017-12-11 NOTE — Telephone Encounter (Signed)
LMOM for patient to return call.

## 2017-12-11 NOTE — Telephone Encounter (Signed)
Please let this patient know that I talked to Dr. Rogue Bussing about Annette Foster (not osphena with is essentially the oral version) which is a product which is DHEA rather than estrogen.  He feels like this is safe even with your personal history of breast cancer given the type of breast cancer as well as that fact that this medication should not impact her blood estrogen levels.    Have her come by and pick up samples if we have then and will send script to pharmacy.  It will take several months none of this medication is helpful.  Hollice Espy, MD

## 2017-12-11 NOTE — Telephone Encounter (Signed)
-----   Message from Cammie Sickle, MD sent at 12/11/2017  8:43 AM EDT ----- Regarding: RE: question for you Caryl Pina- I think that's a good idea. My review shows it does not increase the estradiols in the systemic circulation.  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 GB  ----- Message ----- From: Hollice Espy, MD Sent: 12/07/2017  10:53 AM EDT To: Cammie Sickle, MD Subject: question for you                               This is a patient of mine who has severe dysuria, atrophic vaginitis, and recurrent urinary tract infections who would benefit from topical estrogen cream, however, she has a personal history of breast cancer and followed by you.  Really, oncologist do not like giving these patients estrogen due to concern for systemic absorption.  There is an alternative on the market, Gilberto Better which is a DHEA product.  What are your thoughts about using this medication for this patient?  Hollice Espy

## 2017-12-16 NOTE — Telephone Encounter (Signed)
Patient notified and will come get samples

## 2017-12-25 ENCOUNTER — Other Ambulatory Visit: Payer: Self-pay

## 2017-12-25 ENCOUNTER — Encounter: Payer: Self-pay | Admitting: Radiation Oncology

## 2017-12-25 ENCOUNTER — Ambulatory Visit
Admission: RE | Admit: 2017-12-25 | Discharge: 2017-12-25 | Disposition: A | Discharge status: Home / Self Care | Payer: Medicare Other | Source: Ambulatory Visit | Attending: Radiation Oncology | Admitting: Radiation Oncology

## 2017-12-25 DIAGNOSIS — Z923 Personal history of irradiation: Secondary | ICD-10-CM | POA: Diagnosis not present

## 2017-12-25 DIAGNOSIS — N763 Subacute and chronic vulvitis: Secondary | ICD-10-CM | POA: Insufficient documentation

## 2017-12-25 DIAGNOSIS — Z170 Estrogen receptor positive status [ER+]: Secondary | ICD-10-CM | POA: Diagnosis not present

## 2017-12-25 DIAGNOSIS — D0512 Intraductal carcinoma in situ of left breast: Secondary | ICD-10-CM | POA: Diagnosis present

## 2017-12-25 NOTE — Progress Notes (Signed)
Radiation Oncology Follow up Note  Name: Annette Foster   Date:   12/25/2017 MRN:  [id number] DOB: 04-May-1940    This 77 y.o. female presents to the clinic today for to year follow-up status post whole breast radiation to her left breast forER/PR positive ductal carcinoma in situ.  REFERRING PROVIDER: Albina Billet, MD  HPI: patient is a 77 year old female now out 2 years having completed whole breast radiation to her left breast for ER/PR positive ductal carcinoma in situ. She is seen today in routine follow-up and is doing well. She specifically denies breast tenderness cough or bone pain..back overon her upper quadrant which stereotactic biopsy was performedwhich showedK is prior surgical site history radiation therapy negative for atypia or malignancy.she is currently off therapy having significant problems with recurrent urinary tract infections as well as chronic irritative voiding symptoms currently under the care of Dr. Nelle Don GYNforchronic vulvitis  COMPLICATIONS OF TREATMENT: none  FOLLOW UP COMPLIANCE: keeps appointments   PHYSICAL EXAM:  BP (P) 130/61 (BP Location: Right Arm, Patient Position: Sitting)   Pulse (P) 67   Temp (P) 98 F (36.7 C) (Tympanic)   Resp (P) 18   Wt (P) 167 lb 8.8 oz (76 kg)   LMP  (LMP Unknown)   BMI (P) 29.68 kg/m  Lungs are clear to A&P cardiac examination essentially unremarkable with regular rate and rhythm. No dominant mass or nodularity is noted in either breast in 2 positions examined. Incision is well-healed. No axillary or supraclavicular adenopathy is appreciated. Cosmetic result is excellent.Well-developed well-nourished patient in NAD. HEENT reveals PERLA, EOMI, discs not visualized.  Oral cavity is clear. No oral mucosal lesions are identified. Neck is clear without evidence of cervical or supraclavicular adenopathy. Lungs are clear to A&P. Cardiac examination is essentially unremarkable with regular rate and rhythm without murmur rub or  thrill. Abdomen is benign with no organomegaly or masses noted. Motor sensory and DTR levels are equal and symmetric in the upper and lower extremities. Cranial nerves II through XII are grossly intact. Proprioception is intact. No peripheral adenopathy or edema is identified. No motor or sensory levels are noted. Crude visual fields are within normal range.  RADIOLOGY RESULTS: mammograms reviewed and compatible with the above-stated findings  PLAN: at the present time from a breast standpoint patient is doing wellwith no evidence of disease. He continues with significant problems with chronic cystitis as well as chronic vulvitis under the care of urology and GYN. I have asked to see her back in 1 year follow-up. She is to call with any concerns.  I would like to take this opportunity to thank you for allowing me to participate in the care of your patient.Noreene Filbert, MD

## 2018-01-19 ENCOUNTER — Ambulatory Visit: Payer: Medicare Other | Admitting: Urology

## 2018-01-29 ENCOUNTER — Encounter: Payer: Self-pay | Admitting: Obstetrics and Gynecology

## 2018-01-29 ENCOUNTER — Ambulatory Visit (INDEPENDENT_AMBULATORY_CARE_PROVIDER_SITE_OTHER): Payer: Medicare Other | Admitting: Obstetrics and Gynecology

## 2018-01-29 VITALS — BP 159/62 | HR 72 | Ht 63.0 in | Wt 170.5 lb

## 2018-01-29 DIAGNOSIS — N390 Urinary tract infection, site not specified: Secondary | ICD-10-CM

## 2018-01-29 DIAGNOSIS — L408 Other psoriasis: Secondary | ICD-10-CM

## 2018-01-29 DIAGNOSIS — L308 Other specified dermatitis: Secondary | ICD-10-CM

## 2018-01-29 DIAGNOSIS — N952 Postmenopausal atrophic vaginitis: Secondary | ICD-10-CM

## 2018-01-29 DIAGNOSIS — D0512 Intraductal carcinoma in situ of left breast: Secondary | ICD-10-CM

## 2018-01-29 NOTE — Progress Notes (Signed)
GYN ENCOUNTER NOTE  Subjective:       Annette Foster is a 77 y.o. [phone] female is here for gynecologic evaluation of the following issues:  1.  Chronic vulvitis. 2.  Spongiotic psoriasiform dermatitis 3.  Recurrent UTIs 4.  Vulvovaginal atrophy  Annette Foster presents for interval follow-up.  She is struggling with the chronic vulvar irritation including itching, burning, and occasional bleeding of the perineum.  She continues to use Temovate ointment 0.05% topically once a week and is trying to maintain healthy hygiene including blow drying of the perineum.  She is using Desitin ointment topically to protect the tissue from irritants including urine and diarrhea. Recent cystoscopy by Dr. Erlene Quan in urology revealed normal findings.  She continues to work with her regarding irritative voiding symptoms.  Dr. Erlene Quan actually has recommended Intrarosa vaginal suppositories (DHEAS converts to topical estrogens in the vaginal epithelium). Annette Foster is scheduled to see urogynecology tomorrow for new opinion regarding management.  Other concerns include Annette Foster has been not doing well and he is requesting comfort care only considering his chronic heart disease.  This has placed significant stress on her.   Gynecologic History No LMP recorded (lmp unknown). Patient has had a hysterectomy. Remote history of lichen sclerosus Recent biopsies consistent with spongiotic psoriasiform dermatitis and candidiasis 10/31/2016 biopsy-perianal PERIANAL, LEFT:  BENIGN SKIN TISSUE WITH HYPERKERATOSIS, PARAKERATOSIS, ACANTHOSIS,  ACUTE AND CHRONIC INFLAMMATION AND CANDIDIASIS.  SPECIAL STAIN GMSF DEMONSTRATE FUNGAL HYPHE.  10/31/2016 biopsy-right labia majora  diagnosis:  VULVA, RIGHT LABIA MAJORA, BIOPSY:  BENIGN VULVAR TISSUE WITH HYPERKERATOSIS, PARAKERATOSIS, ACANTHOSIS,  CHRONIC INFLAMMATION AND CANDIDIASIS.SPECIAL STAIN GMSF DEMONSTRATE  FUNGAL HYPHE.  NAP/11/04/2016  07/04/2016 biopsy-perianal, leftPERIANAL AREA, LEFT   MILD PSORIASIFORM AND SPONGIOTIC DERMATITIS, CONSISTENT WITH MILD  SUBACUTE ECZEMATOUS DERMATITIS   Obstetric History OB History  Gravida Para Term Preterm AB Living  _0 SAB TAB Ectopic Multiple Live Births          2    # Outcome Date GA Lbr Len/2nd Weight Sex Delivery Anes PTL Lv  2 Term 1966    F Vag-Spont   LIV  1 Term 1963    F Vag-Spont   LIV    Past Medical History:  Diagnosis Date  . Abdominal pain   . Allergy   . Cancer [facility]) 2017   breast cancer- Left  . Cataract   . CHF (congestive heart failure) ([place])   . Collagenous colitis   . Diabetes mellitus without complication ([place])   . Diarrhea   . Diverticulosis   . Fatty liver disease, nonalcoholic   . Fibrocystic breast   . GERD (gastroesophageal reflux disease)   . Heart murmur   . Hyperlipidemia   . Hypertension   . Hypothyroidism   . IBS (irritable bowel syndrome)   . IBS (irritable bowel syndrome)   . IDA (iron deficiency anemia)   . Personal history of radiation therapy   . PONV (postoperative nausea and vomiting)   . Sleep apnea    C-Pap    Past Surgical History:  Procedure Laterality Date  . ABDOMINAL HYSTERECTOMY     [place] bso  . ABDOMINAL SURGERY    . APPENDECTOMY    . BREAST BIOPSY Bilateral 2016   negative  . 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 BREAST BIOPSY Left 05/27/2016   neg  . BREAST BIOPSY Left 11/20/2017   affirm bx x clip path pending  . BREAST  LUMPECTOMY Left 10/17/2015  . CARDIAC SURGERY    . CATARACT EXTRACTION Right   . CHOLECYSTECTOMY    . COLONOSCOPY WITH PROPOFOL N/A 03/11/2016   Procedure: COLONOSCOPY WITH PROPOFOL;  Surgeon: Manya Silvas, MD;  Location[facility] ENDOSCOPY;  Service: Endoscopy;  Laterality: N/A;  . ESOPHAGOGASTRODUODENOSCOPY (EGD) WITH PROPOFOL N/A 03/11/2016   Procedure: ESOPHAGOGASTRODUODENOSCOPY (EGD) WITH PROPOFOL;  Surgeon: Manya Silvas, MD;  Location: [facility] ENDOSCOPY;  Service: Endoscopy;  Laterality:  N/A;  . JOINT REPLACEMENT Left    TKR  . left sinusplasty     . MASTECTOMY, PARTIAL Left 10/17/2015   Procedure: MASTECTOMY PARTIAL REVISION;  Surgeon: Leonie Green, MD;  Location: [facility] ORS;  Service: General;  Laterality: Left;  . PARTIAL MASTECTOMY WITH NEEDLE LOCALIZATION Left 09/29/2015   Procedure: PARTIAL MASTECTOMY WITH NEEDLE LOCALIZATION;  Surgeon: Leonie Green, MD;  Location: [facility] ORS;  Service: General;  Laterality: Left;  . TOTAL ABDOMINAL HYSTERECTOMY W/ BILATERAL SALPINGOOPHORECTOMY      Current Outpatient Medications on File Prior to Visit  Medication Sig Dispense Refill  . aspirin 81 MG tablet Take 81 mg by mouth at bedtime.    . benzonatate (TESSALON) 100 MG capsule TAKE 1 CAPSULE BY MOUTH 3 TIMES A DAY AS NEEDED FOR COUGH  1  . budesonide-formoterol (SYMBICORT) 80-4.5 MCG/ACT inhaler Inhale 2 puffs into the lungs 2 (two) times daily as needed (shortness of breath).     . cholecalciferol (VITAMIN D) 1000 units tablet Take 2,000 Units by mouth daily.     . Cyanocobalamin (B-12 COMPLIANCE INJECTION) 1000 MCG/ML KIT Inject 1 Syringe as directed every 30 (thirty) days.    Marland Kitchen dicyclomine (BENTYL) 10 MG capsule Take 10 mg by mouth 4 (four) times daily -  before meals and at bedtime.    Marland Kitchen escitalopram (LEXAPRO) 5 MG tablet Take 5 mg by mouth at bedtime.    Marland Kitchen esomeprazole (NEXIUM) 40 MG capsule Take 40 mg by mouth daily at 12 noon.     . fenofibrate 160 MG tablet Take 160 mg by mouth at bedtime.    . fexofenadine (ALLEGRA) 180 MG tablet Take 180 mg by mouth at bedtime.     . fluticasone (FLONASE) 50 MCG/ACT nasal spray Place 1 spray into the nose daily as needed for allergies.     . furosemide (LASIX) 20 MG tablet Take 20-40 mg by mouth every other day. Alternate between 1 a day and 2 a day    . gabapentin (NEURONTIN) 300 MG capsule Take 300 mg by mouth daily.    Marland Kitchen glimepiride (AMARYL) 2 MG tablet Take 2 mg by mouth at bedtime.    . hydrOXYzine (ATARAX/VISTARIL) 25 MG  tablet 1 or 2 tablets at night for itching and sleep 30 tablet 2  . Hyoscyamine Sulfate 0.375 MG TBCR Take 0.375 mg by mouth 2 (two) times daily.     . Insulin Degludec (TRESIBA FLEXTOUCH [place]) Inject 10 Units into the skin at bedtime.    Marland Kitchen levothyroxine (SYNTHROID, LEVOTHROID) 75 MCG tablet Take 75 mcg by mouth daily before breakfast.     . magnesium oxide (MAG-OX) 400 MG tablet Take by mouth.    . Meth-Hyo-M Bl-Na Phos-Ph Sal (URIBEL) 118 MG CAPS Take 1 capsule (118 mg total) by mouth 4 (four) times daily as needed. 120 capsule 1  . MISC NATURAL PRODUCTS PO Take by mouth.    . montelukast (SINGULAIR) 10 MG tablet Take 10 mg by mouth at bedtime.     Marland Kitchen  olopatadine (PATANOL) 0.1 % ophthalmic solution 1 drop 2 (two) times daily.    . ondansetron (ZOFRAN) 4 MG tablet Take 4 mg by mouth every 6 (six) hours as needed. for nausea  1  . phenazopyridine (PYRIDIUM) 95 MG tablet Take 1 tablet (95 mg total) by mouth 3 (three) times daily as needed for pain. 90 tablet 0  . potassium chloride SA (K-DUR,KLOR-CON) 20 MEQ tablet Take by mouth.    . Prasterone (INTRAROSA) 6.5 MG INST Place 1 suppository vaginally at bedtime. 30 each 11  . saccharomyces boulardii (FLORASTOR) 250 MG capsule Take 250 mg by mouth 2 (two) times daily.     . simvastatin (ZOCOR) 20 MG tablet Take 20 mg by mouth at bedtime.    . valACYclovir (VALTREX) 500 MG tablet Take 500 mg by mouth 2 (two) times daily.  1  . valsartan (DIOVAN) 160 MG tablet Take 160 mg by mouth every morning.      No current facility-administered medications on file prior to visit.     Allergies  Allergen Reactions  . Fentanyl Shortness Of Breath, Itching and Nausea And Vomiting  . Codeine Itching and Nausea And Vomiting  . Demeclocycline Other (See Comments) and Rash  . Demerol [Meperidine] Itching and Nausea And Vomiting  . Hydrocodone-Acetaminophen Itching and Nausea And Vomiting  . Other Other (See Comments)  . Oxycodone Itching and Nausea And Vomiting  .  Pentazocine Other (See Comments)  . Tetracyclines & Related Rash    Social History   Socioeconomic History  . Marital status: Married    Spouse name: Not on file  . Number of children: Not on file  . Years of education: Not on file  . Highest education level: Not on file  Occupational History  . Not on file  Social Needs  . Financial resource strain: Not on file  . Food insecurity:    Worry: Not on file    Inability: Not on file  . Transportation needs:    Medical: Not on file    Non-medical: Not on file  Tobacco Use  . Smoking status: Never Smoker  . Smokeless tobacco: Never Used  Substance and Sexual Activity  . Alcohol use: No    Alcohol/week: 0.0 standard drinks  . Drug use: No  . Sexual activity: Not Currently    Birth control/protection: Surgical  Lifestyle  . Physical activity:    Days per week: Not on file    Minutes per session: Not on file  . Stress: Not on file  Relationships  . Social connections:    Talks on phone: Not on file    Gets together: Not on file    Attends religious service: Not on file    Active member of club or organization: Not on file    Attends meetings of clubs or organizations: Not on file    Relationship status: Not on file  . Intimate partner violence:    Fear of current or ex partner: Not on file    Emotionally abused: Not on file    Physically abused: Not on file    Forced sexual activity: Not on file  Other Topics Concern  . Not on file  Social History Narrative  . Not on file    Family History  Problem Relation Age of Onset  . Breast cancer Paternal Grandmother   . Colon cancer Father   . Diabetes Sister   . Diabetes Brother   . Heart disease Brother   . Prostate  cancer Brother   . Colon cancer Maternal Uncle   . Prostate cancer Brother   . Bladder Cancer Brother   . Leukemia Mother        all  . Ovarian cancer Neg Hx   . Kidney cancer Neg Hx     The following portions of the patient's history were reviewed  and updated as appropriate: allergies, current medications, past family history, past medical history, past social history, past surgical history and problem list.  Review of Systems Complete review of systems positive for vulvar irritation including burning and itching as well as dysuria and frequency and urgency  Objective:   BP (!) 159/62   Pulse 72   Ht _0  (1.6 m)   Wt 170 lb 8 oz (77.3 kg)   LMP  (LMP Unknown)   BMI 30.20 kg/m  CONSTITUTIONAL: Well-developed, well-nourished female in no acute distress.  HENT:  Normocephalic, atraumatic.  NECK: Not examined SKIN: Skin is warm and dry. No rash noted. Not diaphoretic. No erythema. No pallor. [place]: Alert and oriented to person, place, and time.  PSYCHIATRIC: Normal mood and affect. Normal behavior. Normal judgment and thought content.  Tearful when discussing spouse. CARDIOVASCULAR:Not Examined RESPIRATORY: Not Examined BREASTS: Not Examined ABDOMEN: Soft, non distended; Non tender.  No Organomegaly. PELVIC: External genitalia-symmetric atrophic changes; no significant leukoplakia present; no epithelial skin breakdown or ulceration. BUS-normal Vagina-atrophic changes without discharge Bimanual-not performed Rectovaginal-external hemorrhoids present, nonthrombosed; chronic perianal hyperemia present      Assessment:   1. Spongiotic psoriasiform dermatitis, without improvement  2. 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 Ductal carcinoma in situ (DCIS) of left breast  4. Recurrent UTI (urinary tract infection), recent urology evaluation negative for infection     Plan:   1.  Continue using the Temovate ointment 0.05% topically to the vulva and perianal region once or twice a week 2.  Continue to blow drying perineum to maintain good hygiene 3.  Continue using Desitin ointment to help protect against irritants daily 4.  Follow-up with urogynecology consultation as scheduled tomorrow 5.  Possible steroid  Dosepak may be a considered alternative to help alleviate the chronic vulvitis flare; will await for Urodine consultation/recommendations. 6.  Return to clinic in 3 to 4 months or as needed for exacerbation of symptoms.  A total of 15 minutes were spent face-to-face with the patient during this encounter and over half of that time dealt with counseling and coordination of care.  Brayton Mars, MD  Note: This dictation was prepared with Dragon dictation along with smaller phrase technology. Any transcriptional errors that result from this process are unintentional.

## 2018-01-29 NOTE — Patient Instructions (Signed)
1.  Continue Temovate ointment to 0.05% topically to the vulva and perianal region once or twice a week to help control vulvar irritation/itching 2.  Continue to blow dry perineum to maintain good hygiene and minimize vulvar irritation 3.  Continue to use Desitin ointment topically to minimize irritation from diarrhea and urine 4.  Follow-up with urogynecology for consultation tomorrow as scheduled 5.  Consideration for steroid Dosepak may be an option to help get ahead of severe vulvar symptomatology.

## 2018-02-19 ENCOUNTER — Ambulatory Visit (INDEPENDENT_AMBULATORY_CARE_PROVIDER_SITE_OTHER): Payer: Medicare Other | Admitting: Podiatry

## 2018-02-19 ENCOUNTER — Encounter: Payer: Self-pay | Admitting: Podiatry

## 2018-02-19 DIAGNOSIS — E1142 Type 2 diabetes mellitus with diabetic polyneuropathy: Secondary | ICD-10-CM

## 2018-02-19 DIAGNOSIS — B351 Tinea unguium: Secondary | ICD-10-CM | POA: Diagnosis not present

## 2018-02-19 DIAGNOSIS — M79674 Pain in right toe(s): Secondary | ICD-10-CM | POA: Diagnosis not present

## 2018-02-19 DIAGNOSIS — M79675 Pain in left toe(s): Secondary | ICD-10-CM | POA: Diagnosis not present

## 2018-02-19 NOTE — Progress Notes (Signed)
Complaint:  Visit Type: Patient returns to my office for continued preventative foot care services. Complaint: Patient states" my nails have grown long and thick and become painful to walk and wear shoes" Patient has been diagnosed with DM with no foot complications. The patient presents for preventative foot care services. No changes to ROS  Podiatric Exam: Vascular: dorsalis pedis and posterior tibial pulses are palpable bilateral. Capillary return is immediate. Temperature gradient is WNL. Skin turgor WNL  Sensorium: Normal Semmes Weinstein monofilament test. Normal tactile sensation bilaterally. Nail Exam: Pt has thick disfigured discolored nails with subungual debris noted bilateral entire nail hallux through fifth toenails.  Right hallux nail plate is absent.  Surgery has healed. Ulcer Exam: There is no evidence of ulcer or pre-ulcerative changes or infection. Orthopedic Exam: Muscle tone and strength are WNL. No limitations in general ROM. No crepitus or effusions noted. Foot type and digits show no abnormalities. Bony prominences are unremarkable. Skin: No Porokeratosis. No infection or ulcers  Diagnosis:  Onychomycosis, , Pain in right toe, pain in left toes  Treatment & Plan Procedures and Treatment: Consent by patient was obtained for treatment procedures. The patient understood the discussion of treatment and procedures well. All questions were answered thoroughly reviewed. Debridement of mycotic and hypertrophic toenails, 1 through 5 bilateral and clearing of subungual debris. No ulceration, no infection noted.   Return Visit-Office Procedure: Patient instructed to return to the office for a follow up visit 4 months for continued evaluation and treatment.    Elisama Thissen DPM 

## 2018-02-25 ENCOUNTER — Telehealth: Payer: Self-pay | Admitting: Urology

## 2018-02-25 NOTE — Telephone Encounter (Signed)
Annette Foster had a follow up appointment with me for her OAB symptoms in December that was cancelled.  Would you call her and have her reschedule?

## 2018-02-26 NOTE — Telephone Encounter (Signed)
Called patient to reschedule and she said she was seeing a urogyno named Dr. Salome Holmes and does not wish to follow up with [place] any longer.   Annette Foster

## 2018-02-27 ENCOUNTER — Encounter: Payer: Self-pay | Admitting: Internal Medicine

## 2018-02-27 ENCOUNTER — Inpatient Hospital Stay: Payer: Medicare Other | Attending: Internal Medicine

## 2018-02-27 ENCOUNTER — Inpatient Hospital Stay (HOSPITAL_BASED_OUTPATIENT_CLINIC_OR_DEPARTMENT_OTHER): Payer: Medicare Other | Admitting: Internal Medicine

## 2018-02-27 VITALS — BP 108/61 | HR 68 | Temp 98.3°F | Resp 16 | Ht 63.0 in | Wt 171.3 lb

## 2018-02-27 DIAGNOSIS — E119 Type 2 diabetes mellitus without complications: Secondary | ICD-10-CM | POA: Insufficient documentation

## 2018-02-27 DIAGNOSIS — D61818 Other pancytopenia: Secondary | ICD-10-CM | POA: Diagnosis not present

## 2018-02-27 DIAGNOSIS — Z7984 Long term (current) use of oral hypoglycemic drugs: Secondary | ICD-10-CM | POA: Diagnosis not present

## 2018-02-27 DIAGNOSIS — Z7982 Long term (current) use of aspirin: Secondary | ICD-10-CM | POA: Insufficient documentation

## 2018-02-27 DIAGNOSIS — D696 Thrombocytopenia, unspecified: Secondary | ICD-10-CM | POA: Diagnosis not present

## 2018-02-27 DIAGNOSIS — N390 Urinary tract infection, site not specified: Secondary | ICD-10-CM

## 2018-02-27 DIAGNOSIS — I10 Essential (primary) hypertension: Secondary | ICD-10-CM | POA: Insufficient documentation

## 2018-02-27 DIAGNOSIS — K7460 Unspecified cirrhosis of liver: Secondary | ICD-10-CM

## 2018-02-27 DIAGNOSIS — Z79899 Other long term (current) drug therapy: Secondary | ICD-10-CM | POA: Insufficient documentation

## 2018-02-27 DIAGNOSIS — D0512 Intraductal carcinoma in situ of left breast: Secondary | ICD-10-CM

## 2018-02-27 DIAGNOSIS — Z86000 Personal history of in-situ neoplasm of breast: Secondary | ICD-10-CM | POA: Diagnosis present

## 2018-02-27 DIAGNOSIS — Z87440 Personal history of urinary (tract) infections: Secondary | ICD-10-CM | POA: Insufficient documentation

## 2018-02-27 DIAGNOSIS — K766 Portal hypertension: Secondary | ICD-10-CM | POA: Diagnosis not present

## 2018-02-27 LAB — COMPREHENSIVE METABOLIC PANEL
ALT: 22 U/L (ref 0–44)
AST: 41 U/L (ref 15–41)
Albumin: 3.7 g/dL (ref 3.5–5.0)
Alkaline Phosphatase: 71 U/L (ref 38–126)
Anion gap: 7 (ref 5–15)
BUN: 13 mg/dL (ref 8–23)
CO2: 29 mmol/L (ref 22–32)
Calcium: 8.9 mg/dL (ref 8.9–10.3)
Chloride: 103 mmol/L (ref 98–111)
Creatinine, Ser: 0.64 mg/dL (ref 0.44–1.00)
GFR calc Af Amer: >60 mL/min (ref >60)
GFR calc non Af Amer: >60 mL/min (ref >60)
Glucose, Bld: 331 mg/dL — ABNORMAL HIGH (ref 70–99)
Potassium: 3.5 mmol/L (ref 3.5–5.1)
Sodium: 139 mmol/L (ref 135–145)
Total Bilirubin: 0.6 mg/dL (ref 0.3–1.2)
Total Protein: 7.6 g/dL (ref 6.5–8.1)

## 2018-02-27 LAB — CBC WITH DIFFERENTIAL/PLATELET
Abs Immature Granulocytes: 0.01 10*3/uL (ref 0.00–0.07)
Basophils Absolute: 0 10*3/uL (ref 0.0–0.1)
Basophils Relative: 1 %
Eosinophils Absolute: 0.1 10*3/uL (ref 0.0–0.5)
Eosinophils Relative: 2 %
HCT: 30.9 % — ABNORMAL LOW (ref 36.0–46.0)
Hemoglobin: 9.6 g/dL — ABNORMAL LOW (ref 12.0–15.0)
Immature Granulocytes: 0 %
Lymphocytes Relative: 39 %
Lymphs Abs: 0.9 10*3/uL (ref 0.7–4.0)
MCH: 27.5 pg (ref 26.0–34.0)
MCHC: 31.1 g/dL (ref 30.0–36.0)
MCV: 88.5 fL (ref 80.0–100.0)
Monocytes Absolute: 0.2 10*3/uL (ref 0.1–1.0)
Monocytes Relative: 9 %
Neutro Abs: 1.2 10*3/uL — ABNORMAL LOW (ref 1.7–7.7)
Neutrophils Relative %: 49 %
Platelets: 96 10*3/uL — ABNORMAL LOW (ref 150–400)
RBC: 3.49 MIL/uL — ABNORMAL LOW (ref 3.87–5.11)
RDW: 14.4 % (ref 11.5–15.5)
WBC: 2.4 10*3/uL — ABNORMAL LOW (ref 4.0–10.5)
nRBC: 0 % (ref 0.0–0.2)

## 2018-02-27 NOTE — Assessment & Plan Note (Addendum)
# #   DCIS/papillary carcinoma in situ-stable.  No clinical evidence of recurrence.  Biopsy 29th November dystrophic calcifications.  #Given patient's overall poor tolerance to letrozole-I think it is reasonable to discontinue letrozole instead.  Given her significant vaginal atrophy/vaginitis related quality-of-life issues-I think it is reasonable to proceed with topical estrogens as recommended by gynecology.  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  #Recurrent UTIs-question colovesical fistula-awaiting abdominal MRI.  #Diabetes poorly controlled-random blood sugar 300+; recommend close monitoring/follow-up with PCP  # DISPOSITION:  # follow up in 7 months-MD-cbc/cmp-Dr.B  Dr.Tate/Gyencol-Dr.Dasouki/

## 2018-02-27 NOTE — Progress Notes (Signed)
Patient here for follow up. She is being followed by urology for recurrent UTIs.

## 2018-02-27 NOTE — Progress Notes (Signed)
Annette Foster PROGRESS NOTE  Patient Care Team: Albina Billet, MD as PCP - General (Internal Medicine)  Cancer Staging No matching staging information was found for the patient.   Oncology History   # AUG 2017- DCIS LEFT BREAST ER/PR- POS; positive margins [Dr.Smith]; s/p re-excision; s/p RT [finish RT Nov 10th 2017]; START ARIMIDEX- Jan 2018; Stopped in July 2018- sec to muscle cramps; Sep 2018- start Letrozole.;  JAN 2019-STOPPED Letrozole sec night sweats/poor tolerance  # Mild pancytopenia [CTApril 2019-cirrhosis/spleneomegaly]. OCT 2018- BMBx- MILD dyspoiesis; variable cellularity [10-50%]; FISH/Cytogenetics-NORMAL; F-One-NGS-declined by insurance.    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 Brandon/uro-gyne,[facility]]  # HRT [clinical trial thru [facility]; stopped July 2017 ]; CPAP   DIAGNOSIS: Left breast DCIS  STAGE:    0     ;GOALS: cure  CURRENT/MOST RECENT THERAPY: surveillaince      Ductal carcinoma in situ (DCIS) of left breast      INTERVAL HISTORY:  Annette Foster 78 y.o.  female pleasant patient above history of DCIS; cirrhosis portal hypertension is here for follow-up.  In the interim patient has been diagnosed with multiple UTIs and is currently awaiting MRI of the abdomen to rule out colovesical fistula.  Patient interim has also been evaluated by urogynecology at [facility]; also gynecologist at [facility] for her frequent UTIs/atrophic vaginitis.  Patient unfortunately continues to have significant symptoms of dysuria/increased frequency.  No fevers.  She unfortunately feels poorly.  This unfortunately is also complicated by her husband's poor health.  Review of Systems  Constitutional: Positive for malaise/fatigue. Negative for chills, diaphoresis, fever and weight loss.  HENT: Negative for nosebleeds and sore throat.   Eyes: Negative for double vision.  Respiratory: Negative for cough, hemoptysis, sputum production,  shortness of breath and wheezing.   Cardiovascular: Negative for chest pain, palpitations, orthopnea and leg swelling.  Gastrointestinal: Negative for abdominal pain, blood in stool, constipation, diarrhea, heartburn, melena, nausea and vomiting.  Genitourinary: Positive for dysuria, frequency and urgency.  Musculoskeletal: Positive for back pain. Negative for joint pain.  Skin: Negative.  Negative for itching and rash.  Neurological: Negative for dizziness, tingling, focal weakness, weakness and headaches.  Endo/Heme/Allergies: Does not bruise/bleed easily.  Psychiatric/Behavioral: Negative for depression. The patient is not nervous/anxious and does not have insomnia.       PAST MEDICAL HISTORY :  Past Medical History:  Diagnosis Date  . Abdominal pain   . Allergy   . Cancer [facility]) 2017   breast cancer- Left  . Cataract   . CHF (congestive heart failure) ([place])   . Collagenous colitis   . Diabetes mellitus without complication ([place])   . Diarrhea   . Diverticulosis   . Fatty liver disease, nonalcoholic   . Fibrocystic breast   . GERD (gastroesophageal reflux disease)   . Heart murmur   . Hyperlipidemia   . Hypertension   . Hypothyroidism   . IBS (irritable bowel syndrome)   . IBS (irritable bowel syndrome)   . IDA (iron deficiency anemia)   . Personal history of radiation therapy   . PONV (postoperative nausea and vomiting)   . Sleep apnea    C-Pap    PAST SURGICAL HISTORY :   Past Surgical History:  Procedure Laterality Date  . ABDOMINAL HYSTERECTOMY     [place] bso  . ABDOMINAL SURGERY    . APPENDECTOMY    . BREAST BIOPSY Bilateral 2016   negative  . BREAST 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/2015  DCIS, papillary carcinoma in situ  . BREAST BIOPSY Left 05/27/2016   neg  . BREAST BIOPSY Left 11/20/2017   affirm bx x clip path pending  . BREAST LUMPECTOMY Left 10/17/2015  . CARDIAC SURGERY    . CATARACT EXTRACTION Right   . CHOLECYSTECTOMY    . COLONOSCOPY WITH PROPOFOL N/A  03/11/2016   Procedure: COLONOSCOPY WITH PROPOFOL;  Surgeon: Manya Silvas, MD;  Location: [facility] ENDOSCOPY;  Service: Endoscopy;  Laterality: N/A;  . ESOPHAGOGASTRODUODENOSCOPY (EGD) WITH PROPOFOL N/A 03/11/2016   Procedure: ESOPHAGOGASTRODUODENOSCOPY (EGD) WITH PROPOFOL;  Surgeon: Manya Silvas, MD;  Location: [facility] ENDOSCOPY;  Service: Endoscopy;  Laterality: N/A;  . JOINT REPLACEMENT Left    TKR  . left sinusplasty     . MASTECTOMY, PARTIAL Left 10/17/2015   Procedure: MASTECTOMY PARTIAL REVISION;  Surgeon: Leonie Green, MD;  Location: [facility] ORS;  Service: General;  Laterality: Left;  . PARTIAL MASTECTOMY WITH NEEDLE LOCALIZATION Left 09/29/2015   Procedure: PARTIAL MASTECTOMY WITH NEEDLE LOCALIZATION;  Surgeon: Leonie Green, MD;  Location: [facility] ORS;  Service: General;  Laterality: Left;  . TOTAL ABDOMINAL HYSTERECTOMY W/ BILATERAL SALPINGOOPHORECTOMY      FAMILY HISTORY :   Family History  Problem Relation Age of Onset  . Breast cancer Paternal Grandmother   . Colon cancer Father   . Diabetes Sister   . Diabetes Brother   . Heart disease Brother   . Prostate cancer Brother   . Colon cancer Maternal Uncle   . Prostate cancer Brother   . Bladder Cancer Brother   . Leukemia Mother        all  . Ovarian cancer Neg Hx   . Kidney cancer Neg Hx     SOCIAL HISTORY:   Social History   Tobacco Use  . Smoking status: Never Smoker  . Smokeless tobacco: Never Used  Substance Use Topics  . Alcohol use: No    Alcohol/week: 0.0 standard drinks  . Drug use: No    ALLERGIES:  is allergic to fentanyl; codeine; demeclocycline; demerol [meperidine]; hydrocodone-acetaminophen; other; oxycodone; pentazocine; tetracyclines & related; hydrocodone-acetaminophen; and tetracycline.  MEDICATIONS:  Current Outpatient Medications  Medication Sig Dispense Refill  . aspirin 81 MG tablet Take 81 mg by mouth at bedtime.    . benzonatate (TESSALON) 100 MG capsule TAKE 1 CAPSULE BY MOUTH  3 TIMES A DAY AS NEEDED FOR COUGH  1  . budesonide-formoterol (SYMBICORT) 80-4.5 MCG/ACT inhaler Inhale 2 puffs into the lungs 2 (two) times daily as needed (shortness of breath).     . cholecalciferol (VITAMIN D) 1000 units tablet Take 2,000 Units by mouth daily.     . Cyanocobalamin (B-12 COMPLIANCE INJECTION) 1000 MCG/ML KIT Inject 1 Syringe as directed every 30 (thirty) days.    [place] Kitchen dicyclomine (BENTYL) 10 MG capsule Take 10 mg by mouth 4 (four) times daily -  before meals and at bedtime.    [place] Kitchen escitalopram (LEXAPRO) 5 MG tablet Take 5 mg by mouth at bedtime.    Mar[place] Kitchen esomeprazole (NEXIUM) 40 MG capsule Take 40 mg by mouth daily at 12 noon.     . fenofibrate 160 MG tablet Take 160 mg by mouth at bedtime.    . fexofenadine (ALLEGRA) 180 MG tablet Take 180 mg by mouth at bedtime.     . fluticasone (FLONASE) 50 MCG/ACT nasal spray Place 1 spray into the nose daily as needed for allergies.     . furosemide (LASIX) 20 MG tablet Take 20-40 mg by  mouth every other day. Alternate between 1 a day and 2 a day    . gabapentin (NEURONTIN) 300 MG capsule Take 300 mg by mouth daily.    [place] Kitchen glimepiride (AMARYL) 2 MG tablet Take 2 mg by mouth at bedtime.    . hydrOXYzine (ATARAX/VISTARIL) 25 MG tablet 1 or 2 tablets at night for itching and sleep 30 tablet 2  . Hyoscyamine Sulfate 0.375 MG TBCR Take 0.375 mg by mouth 2 (two) times daily.     . Insulin Degludec (TRESIBA FLEXTOUCH [place]) Inject 10 Units into the skin at bedtime.    Mar[place] Kitchen levothyroxine (SYNTHROID, LEVOTHROID) 75 MCG tablet Take 75 mcg by mouth daily before breakfast.     . magnesium oxide (MAG-OX) 400 MG tablet Take by mouth.    . Meth-Hyo-M Bl-Na Phos-Ph Sal (URIBEL) 118 MG CAPS Take 1 capsule (118 mg total) by mouth 4 (four) times daily as needed. 120 capsule 1  . MISC NATURAL PRODUCTS PO Take by mouth.    . montelukast (SINGULAIR) 10 MG tablet Take 10 mg by mouth at bedtime.     [place] Kitchen olopatadine (PATANOL) 0.1 % ophthalmic solution 1 drop 2 (two) times daily.     . ondansetron (ZOFRAN) 4 MG tablet Take 4 mg by mouth every 6 (six) hours as needed. for nausea  1  . phenazopyridine (PYRIDIUM) 95 MG tablet Take 1 tablet (95 mg total) by mouth 3 (three) times daily as needed for pain. 90 tablet 0  . potassium chloride SA (K-DUR,KLOR-CON) 20 MEQ tablet Take by mouth.    . Prasterone (INTRAROSA) 6.5 MG INST Place 1 suppository vaginally at bedtime. 30 each 11  . saccharomyces boulardii (FLORASTOR) 250 MG capsule Take 250 mg by mouth 2 (two) times daily.     . simvastatin (ZOCOR) 20 MG tablet Take 20 mg by mouth at bedtime.    . valACYclovir (VALTREX) 500 MG tablet Take 500 mg by mouth 2 (two) times daily.  1  . valsartan (DIOVAN) 160 MG tablet Take 160 mg by mouth every morning.      No current facility-administered medications for this visit.     PHYSICAL EXAMINATION: ECOG PERFORMANCE STATUS: 1 - Symptomatic but completely ambulatory  BP 108/61 (BP Location: Right Arm, Patient Position: Sitting)   Pulse 68   Temp 98.3 F (36.8 C) (Tympanic)   Resp 16   Ht '5\' 3"'$  (1.6 m)   Wt 171 lb 4.8 oz (77.7 kg)   LMP  (LMP Unknown)   BMI 30.34 kg/m   Filed Weights   02/27/18 1336  Weight: 171 lb 4.8 oz (77.7 kg)    Physical Exam  Constitutional: She is oriented to person, place, and time and well-developed, well-nourished, and in no distress.  HENT:  Head: Normocephalic and atraumatic.  Mouth/Throat: Oropharynx is clear and moist. No oropharyngeal exudate.  Eyes: Pupils are equal, round, and reactive to light.  Neck: Normal range of motion. Neck supple.  Cardiovascular: Normal rate and regular rhythm.  Pulmonary/Chest: No respiratory distress. She has no wheezes.  Abdominal: Soft. Bowel sounds are normal. She exhibits no distension and no mass. There is no abdominal tenderness. There is no rebound and no guarding.  Musculoskeletal: Normal range of motion.        General: No tenderness or edema.  Neurological: She is alert and oriented to person,  place, and time.  Skin: Skin is warm.  Right and left BREAST exam (in the presence of nurse)- no unusual skin changes or dominant  masses felt. Surgical scars noted.   Psychiatric: Affect normal.         LABORATORY DATA:  I have reviewed the data as listed    Component Value Date/Time   NA 139 02/27/2018 1318   K 3.5 02/27/2018 1318   CL 103 02/27/2018 1318   CO2 29 02/27/2018 1318   GLUCOSE 331 (H) 02/27/2018 1318   BUN 13 02/27/2018 1318   CREATININE 0.64 02/27/2018 1318   CALCIUM 8.9 02/27/2018 1318   PROT 7.6 02/27/2018 1318   ALBUMIN 3.7 02/27/2018 1318   AST 41 02/27/2018 1318   ALT 22 02/27/2018 1318   ALKPHOS 71 02/27/2018 1318   BILITOT 0.6 02/27/2018 1318   GFRNONAA >60 02/27/2018 1318   GFRAA >60 02/27/2018 1318    No results found for: SPEP, UPEP  Lab Results  Component Value Date   WBC 2.4 (L) 02/27/2018   NEUTROABS 1.2 (L) 02/27/2018   HGB 9.6 (L) 02/27/2018   HCT 30.9 (L) 02/27/2018   MCV 88.5 02/27/2018   PLT 96 (L) 02/27/2018      Chemistry      Component Value Date/Time   NA 139 02/27/2018 1318   K 3.5 02/27/2018 1318   CL 103 02/27/2018 1318   CO2 29 02/27/2018 1318   BUN 13 02/27/2018 1318   CREATININE 0.64 02/27/2018 1318      Component Value Date/Time   CALCIUM 8.9 02/27/2018 1318   ALKPHOS 71 02/27/2018 1318   AST 41 02/27/2018 1318   ALT 22 02/27/2018 1318   BILITOT 0.6 02/27/2018 1318       RADIOGRAPHIC STUDIES: I have personally reviewed the radiological images as listed and agreed with the findings in the report. No results found.   ASSESSMENT & PLAN:  Ductal carcinoma in situ (DCIS) of left breast # # DCIS/papillary carcinoma in situ-stable.  No clinical evidence of recurrence.  Biopsy 29th November dystrophic calcifications.  #Given patient's overall poor tolerance to letrozole-I think it is reasonable to discontinue letrozole instead.  Given her significant vaginal atrophy/vaginitis related quality-of-life issues-I  think it is reasonable to proceed with topical estrogens as recommended by gynecology.  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  #Recurrent UTIs-question colovesical fistula-awaiting abdominal MRI.  #Diabetes poorly controlled-random blood sugar 300+; recommend close monitoring/follow-up with PCP  # DISPOSITION:  # follow up in 7 months-MD-cbc/cmp-Dr.B  Dr.Tate/Gyencol-Dr.Dasouki/   Orders Placed This Encounter  Procedures  . Comprehensive metabolic panel    Standing Status:   Future    Standing Expiration Date:   02/28/2019  . CBC with Differential    Standing Status:   Future    Standing Expiration Date:   02/28/2019   All questions were answered. The patient knows to call the clinic with any problems, questions or concerns.      Cammie Sickle, MD 02/27/2018 7:48 PM

## 2018-03-23 ENCOUNTER — Other Ambulatory Visit
Admission: RE | Admit: 2018-03-23 | Discharge: 2018-03-23 | Disposition: A | Discharge status: Home / Self Care | Payer: Medicare Other | Source: Ambulatory Visit | Attending: Student | Admitting: Student

## 2018-03-23 DIAGNOSIS — K529 Noninfective gastroenteritis and colitis, unspecified: Secondary | ICD-10-CM | POA: Insufficient documentation

## 2018-03-23 LAB — GASTROINTESTINAL PANEL BY PCR, STOOL (REPLACES STOOL CULTURE)

## 2018-03-23 LAB — C DIFFICILE QUICK SCREEN W PCR REFLEX
C Diff antigen: NEGATIVE
C Diff interpretation: NOT DETECTED
C Diff toxin: NEGATIVE

## 2018-03-24 LAB — CALPROTECTIN, FECAL: Calprotectin, Fecal: 78 ug/g (ref 0–120)

## 2018-06-18 ENCOUNTER — Encounter: Payer: Self-pay | Admitting: Podiatry

## 2018-06-18 ENCOUNTER — Ambulatory Visit (INDEPENDENT_AMBULATORY_CARE_PROVIDER_SITE_OTHER): Payer: Medicare Other | Admitting: Podiatry

## 2018-06-18 ENCOUNTER — Other Ambulatory Visit: Payer: Self-pay

## 2018-06-18 VITALS — Temp 96.4°F

## 2018-06-18 DIAGNOSIS — B351 Tinea unguium: Secondary | ICD-10-CM

## 2018-06-18 DIAGNOSIS — E1142 Type 2 diabetes mellitus with diabetic polyneuropathy: Secondary | ICD-10-CM

## 2018-06-18 DIAGNOSIS — M79674 Pain in right toe(s): Secondary | ICD-10-CM

## 2018-06-18 DIAGNOSIS — M79675 Pain in left toe(s): Secondary | ICD-10-CM

## 2018-06-18 NOTE — Progress Notes (Signed)
Complaint:  Visit Type: Patient returns to my office for continued preventative foot care services. Complaint: Patient states" my nails have grown long and thick and become painful to walk and wear shoes" Patient has been diagnosed with DM with no foot complications. The patient presents for preventative foot care services. No changes to ROS  Podiatric Exam: Vascular: dorsalis pedis and posterior tibial pulses are palpable bilateral. Capillary return is immediate. Temperature gradient is WNL. Skin turgor WNL  Sensorium: Normal Semmes Weinstein monofilament test. Normal tactile sensation bilaterally. Nail Exam: Pt has thick disfigured discolored nails with subungual debris noted bilateral entire nail hallux through fifth toenails.  Right hallux nail plate is absent.  Surgery has healed. Ulcer Exam: There is no evidence of ulcer or pre-ulcerative changes or infection. Orthopedic Exam: Muscle tone and strength are WNL. No limitations in general ROM. No crepitus or effusions noted. Foot type and digits show no abnormalities. Bony prominences are unremarkable. Skin: No Porokeratosis. No infection or ulcers  Diagnosis:  Onychomycosis, , Pain in right toe, pain in left toes  Treatment & Plan Procedures and Treatment: Consent by patient was obtained for treatment procedures. The patient understood the discussion of treatment and procedures well. All questions were answered thoroughly reviewed. Debridement of mycotic and hypertrophic toenails, 1 through 5 bilateral and clearing of subungual debris. No ulceration, no infection noted.   Return Visit-Office Procedure: Patient instructed to return to the office for a follow up visit 4 months for continued evaluation and treatment.    Gardiner Barefoot DPM

## 2018-07-12 ENCOUNTER — Other Ambulatory Visit: Payer: Self-pay | Admitting: Urology

## 2018-07-31 ENCOUNTER — Other Ambulatory Visit: Payer: Self-pay | Admitting: Urology

## 2018-08-28 ENCOUNTER — Encounter
Admission: RE | Admit: 2018-08-28 | Discharge: 2018-08-28 | Disposition: A | Discharge status: Home / Self Care | Payer: Medicare Other | Source: Ambulatory Visit | Attending: Orthopedic Surgery | Admitting: Orthopedic Surgery

## 2018-08-28 ENCOUNTER — Other Ambulatory Visit: Payer: Self-pay

## 2018-08-28 DIAGNOSIS — E119 Type 2 diabetes mellitus without complications: Secondary | ICD-10-CM | POA: Insufficient documentation

## 2018-08-28 DIAGNOSIS — Z01818 Encounter for other preprocedural examination: Secondary | ICD-10-CM | POA: Diagnosis not present

## 2018-08-28 DIAGNOSIS — Z1159 Encounter for screening for other viral diseases: Secondary | ICD-10-CM | POA: Insufficient documentation

## 2018-08-28 DIAGNOSIS — R001 Bradycardia, unspecified: Secondary | ICD-10-CM | POA: Insufficient documentation

## 2018-08-28 DIAGNOSIS — R9431 Abnormal electrocardiogram [ECG] [EKG]: Secondary | ICD-10-CM | POA: Insufficient documentation

## 2018-08-28 NOTE — Patient Instructions (Signed)
Your procedure is scheduled JG:GEZMO 09/03/18  Report to [place]. To find out your arrival time please call [phone] between 1PM - 3PM on Wed 09/02/18.  Remember: Instructions that are not followed completely may result in serious medical risk, up to and including death, or upon the discretion of your surgeon and anesthesiologist your surgery may need to be rescheduled.     _X__ 1. Do not eat food after midnight the night before your procedure.                 No gum chewing or hard candies. You may drink clear liquids up to 2 hours                 before you are scheduled to arrive for your surgery- DO not drink clear                 liquids within 2 hours of the start of your surgery.                 Clear Liquids include:  water, apple juice without pulp, clear carbohydrate                 drink such as Clearfast or Gatorade, Black Coffee or Tea (Do not add                 anything to coffee or tea). Diabetics water only  __X__2.  On the morning of surgery brush your teeth with toothpaste and water, you                 may rinse your mouth with mouthwash if you wish.  Do not swallow any              toothpaste of mouthwash.     _X__ 3.  No Alcohol for 24 hours before or after surgery.   _X__ 4.  Do Not Smoke or use e-cigarettes For 24 Hours Prior to Your Surgery.                 Do not use any chewable tobacco products for at least 6 hours prior to                 surgery.  ____  5.  Bring all medications with you on the day of surgery if instructed.   __X__  6.  Notify your doctor if there is any change in your medical condition      (cold, fever, infections).     Do not wear jewelry, make-up, hairpins, clips or nail polish. Do not wear lotions, powders, or perfumes.  Do not shave 48 hours prior to surgery. Men may shave face and neck. Do not bring valuables to the hospital.    [facility] is not responsible for any  belongings or valuables.  Contacts, dentures/partials or body piercings may not be worn into surgery. Bring a case for your contacts, glasses or hearing aids, a denture cup will be supplied. Leave your suitcase in the car. After surgery it may be brought to your room. For patients admitted to the hospital, discharge time is determined by your treatment team.   Patients discharged the day of surgery will not be allowed to drive home.   Please read over the following fact sheets that you were given:   MRSA Information  __X__ Take these medicines the morning of surgery with A SIP OF WATER:  1. esomeprazole (NEXIUM  2. Hyoscyamine Sulfate   3. letrozole (FEMARA  4. levothyroxine (SYNTHROID, LEVOTHROID)  5. magnesium oxide (MAG-OX  6. valACYclovir (VALTREX)   ____ Fleet Enema (as directed)   __X__ Use CHG Soap/SAGE wipes as directed  __X__ Use inhalers on the day of surgery  __X__ Stop metformin/Janumet/Farxiga 2 days prior to surgery    __X__ Take 1/2 of usual insulin dose the night before surgery. No insulin the morning          of surgery.   ____ Stop Blood Thinners Coumadin/Plavix/Xarelto/Pleta/Pradaxa/Eliquis/Effient/Aspirin  on   Or contact your Surgeon, Cardiologist or Medical Doctor regarding  ability to stop your blood thinners  __X__ Stop Anti-inflammatories 7 days before surgery such as Advil, Ibuprofen, Motrin,  BC or Goodies Powder, Naprosyn, Naproxen, Aleve, Aspirin    __X__ Stop all herbal supplements, fish oil or vitamin E until after surgery.    __X__ Bring C-Pap to the hospital.

## 2018-08-31 ENCOUNTER — Other Ambulatory Visit
Admission: RE | Admit: 2018-08-31 | Discharge: 2018-08-31 | Disposition: A | Discharge status: Home / Self Care | Payer: Medicare Other | Source: Ambulatory Visit | Attending: Orthopedic Surgery | Admitting: Orthopedic Surgery

## 2018-08-31 ENCOUNTER — Other Ambulatory Visit: Payer: Self-pay

## 2018-08-31 DIAGNOSIS — Z01818 Encounter for other preprocedural examination: Secondary | ICD-10-CM | POA: Diagnosis not present

## 2018-09-01 LAB — SARS CORONAVIRUS 2 (TAT 6-24 HRS): SARS Coronavirus 2: NEGATIVE

## 2018-09-02 MED ORDER — CEFAZOLIN SODIUM-DEXTROSE 2-4 GM/100ML-% IV SOLN
2.0000 g | Freq: Once | INTRAVENOUS | Status: AC
  Administered 2018-09-03: 2 g via INTRAVENOUS

## 2018-09-03 ENCOUNTER — Encounter: Payer: Self-pay | Admitting: *Deleted

## 2018-09-03 ENCOUNTER — Ambulatory Visit: Payer: Medicare Other

## 2018-09-03 ENCOUNTER — Encounter
Admission: RE | Disposition: A | Discharge status: Home / Self Care | Payer: Self-pay | Source: Home / Self Care | Attending: Orthopedic Surgery

## 2018-09-03 ENCOUNTER — Ambulatory Visit
Admission: RE | Admit: 2018-09-03 | Discharge: 2018-09-03 | Disposition: A | Discharge status: Home / Self Care | Payer: Medicare Other | Attending: Orthopedic Surgery | Admitting: Orthopedic Surgery

## 2018-09-03 ENCOUNTER — Ambulatory Visit: Payer: Medicare Other | Admitting: Anesthesiology

## 2018-09-03 DIAGNOSIS — Z9849 Cataract extraction status, unspecified eye: Secondary | ICD-10-CM | POA: Diagnosis not present

## 2018-09-03 DIAGNOSIS — Z7982 Long term (current) use of aspirin: Secondary | ICD-10-CM | POA: Diagnosis not present

## 2018-09-03 DIAGNOSIS — Z794 Long term (current) use of insulin: Secondary | ICD-10-CM | POA: Diagnosis not present

## 2018-09-03 DIAGNOSIS — Z79890 Hormone replacement therapy (postmenopausal): Secondary | ICD-10-CM | POA: Insufficient documentation

## 2018-09-03 DIAGNOSIS — M67431 Ganglion, right wrist: Secondary | ICD-10-CM | POA: Diagnosis not present

## 2018-09-03 DIAGNOSIS — I509 Heart failure, unspecified: Secondary | ICD-10-CM | POA: Insufficient documentation

## 2018-09-03 DIAGNOSIS — T85848A Pain due to other internal prosthetic devices, implants and grafts, initial encounter: Secondary | ICD-10-CM | POA: Insufficient documentation

## 2018-09-03 DIAGNOSIS — Z79899 Other long term (current) drug therapy: Secondary | ICD-10-CM | POA: Diagnosis not present

## 2018-09-03 DIAGNOSIS — Z9012 Acquired absence of left breast and nipple: Secondary | ICD-10-CM | POA: Insufficient documentation

## 2018-09-03 DIAGNOSIS — I110 Hypertensive heart disease with heart failure: Secondary | ICD-10-CM | POA: Diagnosis not present

## 2018-09-03 DIAGNOSIS — E785 Hyperlipidemia, unspecified: Secondary | ICD-10-CM | POA: Diagnosis not present

## 2018-09-03 DIAGNOSIS — E119 Type 2 diabetes mellitus without complications: Secondary | ICD-10-CM | POA: Diagnosis not present

## 2018-09-03 DIAGNOSIS — Z952 Presence of prosthetic heart valve: Secondary | ICD-10-CM | POA: Insufficient documentation

## 2018-09-03 DIAGNOSIS — G4730 Sleep apnea, unspecified: Secondary | ICD-10-CM | POA: Diagnosis not present

## 2018-09-03 DIAGNOSIS — Z79811 Long term (current) use of aromatase inhibitors: Secondary | ICD-10-CM | POA: Insufficient documentation

## 2018-09-03 DIAGNOSIS — M189 Osteoarthritis of first carpometacarpal joint, unspecified: Secondary | ICD-10-CM | POA: Diagnosis not present

## 2018-09-03 DIAGNOSIS — R569 Unspecified convulsions: Secondary | ICD-10-CM | POA: Diagnosis not present

## 2018-09-03 DIAGNOSIS — Z7951 Long term (current) use of inhaled steroids: Secondary | ICD-10-CM | POA: Insufficient documentation

## 2018-09-03 DIAGNOSIS — K219 Gastro-esophageal reflux disease without esophagitis: Secondary | ICD-10-CM | POA: Diagnosis not present

## 2018-09-03 DIAGNOSIS — Z791 Long term (current) use of non-steroidal anti-inflammatories (NSAID): Secondary | ICD-10-CM | POA: Insufficient documentation

## 2018-09-03 DIAGNOSIS — E039 Hypothyroidism, unspecified: Secondary | ICD-10-CM | POA: Insufficient documentation

## 2018-09-03 HISTORY — PX: FINGER ARTHROSCOPY WITH CARPOMETACARPEL (CMC) ARTHROPLASTY: SHX5629

## 2018-09-03 HISTORY — PX: GANGLION CYST EXCISION: SHX1691

## 2018-09-03 HISTORY — PX: HARDWARE REMOVAL: SHX979

## 2018-09-03 LAB — GLUCOSE, CAPILLARY
Glucose-Capillary: 141 mg/dL — ABNORMAL HIGH (ref 70–99)
Glucose-Capillary: 230 mg/dL — ABNORMAL HIGH (ref 70–99)
Glucose-Capillary: 242 mg/dL — ABNORMAL HIGH (ref 70–99)
Glucose-Capillary: 237 mg/dL — ABNORMAL HIGH (ref 70–99)

## 2018-09-03 SURGERY — REMOVAL, HARDWARE
Anesthesia: General | Site: Wrist | Laterality: Right

## 2018-09-03 MED ORDER — NEOMYCIN-POLYMYXIN B GU 40-200000 IR SOLN
Status: AC
  Filled 2018-09-03: qty 4

## 2018-09-03 MED ORDER — FAMOTIDINE 20 MG PO TABS
20.0000 mg | ORAL_TABLET | Freq: Once | ORAL | Status: AC
  Administered 2018-09-03: 20 mg via ORAL

## 2018-09-03 MED ORDER — METOCLOPRAMIDE HCL 10 MG PO TABS: 5.0000 mg | ORAL_TABLET | Freq: Three times a day (TID) | ORAL | Status: DC | PRN

## 2018-09-03 MED ORDER — HEMOSTATIC AGENTS (NO CHARGE) OPTIME
TOPICAL | Status: DC | PRN
  Administered 2018-09-03: 1 via TOPICAL

## 2018-09-03 MED ORDER — FAMOTIDINE 20 MG PO TABS
ORAL_TABLET | ORAL | Status: AC
  Administered 2018-09-03: 20 mg via ORAL
  Filled 2018-09-03: qty 1

## 2018-09-03 MED ORDER — TRAMADOL HCL 50 MG PO TABS: 50.0000 mg | ORAL_TABLET | Freq: Four times a day (QID) | ORAL | 0 refills | Status: AC | PRN

## 2018-09-03 MED ORDER — INSULIN ASPART 100 UNIT/ML ~~LOC~~ SOLN
SUBCUTANEOUS | Status: AC
  Filled 2018-09-03: qty 1

## 2018-09-03 MED ORDER — METOCLOPRAMIDE HCL 5 MG/ML IJ SOLN: 5.0000 mg | Freq: Three times a day (TID) | INTRAMUSCULAR | Status: DC | PRN

## 2018-09-03 MED ORDER — INSULIN ASPART 100 UNIT/ML ~~LOC~~ SOLN
5.0000 [IU] | Freq: Once | SUBCUTANEOUS | Status: AC
  Administered 2018-09-03: 5 [IU] via SUBCUTANEOUS

## 2018-09-03 MED ORDER — ONDANSETRON HCL 4 MG/2ML IJ SOLN: 4.0000 mg | Freq: Once | INTRAMUSCULAR | Status: DC | PRN

## 2018-09-03 MED ORDER — LIDOCAINE HCL (CARDIAC) PF 100 MG/5ML IV SOSY
PREFILLED_SYRINGE | INTRAVENOUS | Status: DC | PRN
  Administered 2018-09-03: 80 mg via INTRAVENOUS

## 2018-09-03 MED ORDER — TRAMADOL HCL 50 MG PO TABS: 50.0000 mg | ORAL_TABLET | Freq: Four times a day (QID) | ORAL | Status: DC

## 2018-09-03 MED ORDER — GELATIN ABSORBABLE 12-7 MM EX MISC
CUTANEOUS | Status: AC
  Filled 2018-09-03: qty 1

## 2018-09-03 MED ORDER — BUPIVACAINE HCL (PF) 0.5 % IJ SOLN
INTRAMUSCULAR | Status: AC
  Filled 2018-09-03: qty 30

## 2018-09-03 MED ORDER — DEXAMETHASONE SODIUM PHOSPHATE 10 MG/ML IJ SOLN
INTRAMUSCULAR | Status: DC | PRN
  Administered 2018-09-03: 4 mg via INTRAVENOUS

## 2018-09-03 MED ORDER — ONDANSETRON HCL 4 MG/2ML IJ SOLN: 4.0000 mg | Freq: Four times a day (QID) | INTRAMUSCULAR | Status: DC | PRN

## 2018-09-03 MED ORDER — FENTANYL CITRATE (PF) 100 MCG/2ML IJ SOLN
INTRAMUSCULAR | Status: DC | PRN
  Administered 2018-09-03 (×3): 25 ug via INTRAVENOUS

## 2018-09-03 MED ORDER — EPHEDRINE SULFATE 50 MG/ML IJ SOLN
INTRAMUSCULAR | Status: DC | PRN
  Administered 2018-09-03: 10 mg via INTRAVENOUS

## 2018-09-03 MED ORDER — PROPOFOL 10 MG/ML IV BOLUS
INTRAVENOUS | Status: DC | PRN
  Administered 2018-09-03: 150 mg via INTRAVENOUS
  Administered 2018-09-03: 20 mg via INTRAVENOUS
  Administered 2018-09-03: 30 mg via INTRAVENOUS

## 2018-09-03 MED ORDER — LACTATED RINGERS IV SOLN
INTRAVENOUS | Status: DC | PRN
  Administered 2018-09-03: 08:00:00 via INTRAVENOUS

## 2018-09-03 MED ORDER — PROPOFOL 10 MG/ML IV BOLUS
INTRAVENOUS | Status: AC
  Filled 2018-09-03: qty 20

## 2018-09-03 MED ORDER — ONDANSETRON HCL 4 MG PO TABS: 4.0000 mg | ORAL_TABLET | Freq: Four times a day (QID) | ORAL | Status: DC | PRN

## 2018-09-03 MED ORDER — NEOMYCIN-POLYMYXIN B GU 40-200000 IR SOLN
Status: DC | PRN
  Administered 2018-09-03: 2 mL

## 2018-09-03 MED ORDER — FENTANYL CITRATE (PF) 100 MCG/2ML IJ SOLN: 25.0000 ug | INTRAMUSCULAR | Status: DC | PRN

## 2018-09-03 MED ORDER — FENTANYL CITRATE (PF) 100 MCG/2ML IJ SOLN
INTRAMUSCULAR | Status: AC
  Filled 2018-09-03: qty 2

## 2018-09-03 MED ORDER — SODIUM CHLORIDE 0.9 % IV SOLN: INTRAVENOUS | Status: DC

## 2018-09-03 MED ORDER — CEFAZOLIN SODIUM-DEXTROSE 2-4 GM/100ML-% IV SOLN
INTRAVENOUS | Status: AC
  Filled 2018-09-03: qty 100

## 2018-09-03 MED ORDER — INSULIN ASPART 100 UNIT/ML ~~LOC~~ SOLN
SUBCUTANEOUS | Status: AC
  Administered 2018-09-03: 4 [IU] via SUBCUTANEOUS
  Filled 2018-09-03: qty 1

## 2018-09-03 MED ORDER — ONDANSETRON HCL 4 MG/2ML IJ SOLN
INTRAMUSCULAR | Status: DC | PRN
  Administered 2018-09-03: 4 mg via INTRAVENOUS

## 2018-09-03 MED ORDER — BUPIVACAINE HCL 0.5 % IJ SOLN
INTRAMUSCULAR | Status: DC | PRN
  Administered 2018-09-03: 15 mL

## 2018-09-03 MED ORDER — INSULIN ASPART 100 UNIT/ML ~~LOC~~ SOLN
4.0000 [IU] | Freq: Once | SUBCUTANEOUS | Status: AC
  Administered 2018-09-03: 10:00:00 4 [IU] via SUBCUTANEOUS

## 2018-09-03 MED ORDER — SUCCINYLCHOLINE CHLORIDE 20 MG/ML IJ SOLN
INTRAMUSCULAR | Status: DC | PRN
  Administered 2018-09-03: 100 mg via INTRAVENOUS

## 2018-09-03 MED ORDER — SODIUM CHLORIDE 0.9 % IV SOLN
Freq: Once | INTRAVENOUS | Status: AC
  Administered 2018-09-03: 07:00:00 via INTRAVENOUS

## 2018-09-03 SURGICAL SUPPLY — 58 items
APL PRP STRL LF DISP 70% ISPRP (MISCELLANEOUS) ×2
BNDG CMPR STD VLCR NS LF 5.8X3 (GAUZE/BANDAGES/DRESSINGS) ×2
BNDG ELASTIC 3X5.8 VLCR NS LF (GAUZE/BANDAGES/DRESSINGS) ×3 IMPLANT
CANISTER SUCT 1200ML W/VALVE (MISCELLANEOUS) ×3 IMPLANT
CAST PADDING 3X4FT ST 30246 (SOFTGOODS) ×1
CHLORAPREP W/TINT 26 (MISCELLANEOUS) ×3 IMPLANT
COVER WAND RF STERILE (DRAPES) ×3 IMPLANT
CUFF TOURN SGL QUICK 18X4 (TOURNIQUET CUFF) ×1 IMPLANT
CUFF TOURN SGL QUICK 24 (TOURNIQUET CUFF)
CUFF TOURN SGL QUICK 30 (TOURNIQUET CUFF)
CUFF TRNQT CYL 24X4X16.5-23 (TOURNIQUET CUFF) IMPLANT
CUFF TRNQT CYL 30X4X21-28X (TOURNIQUET CUFF) IMPLANT
DRAPE C-ARM XRAY 36X54 (DRAPES) ×3 IMPLANT
DRAPE INCISE IOBAN 66X45 STRL (DRAPES) ×3 IMPLANT
DRSG EMULSION OIL 3X8 NADH (GAUZE/BANDAGES/DRESSINGS) ×3 IMPLANT
ELECT CAUTERY BLADE 6.4 (BLADE) ×3 IMPLANT
ELECT CAUTERY NDL 2.0 MIC (NEEDLE) ×2 IMPLANT
ELECT CAUTERY NEEDLE 2.0 MIC (NEEDLE) ×3 IMPLANT
ELECT REM PT RETURN 9FT ADLT (ELECTROSURGICAL) ×3
ELECTRODE REM PT RTRN 9FT ADLT (ELECTROSURGICAL) ×2 IMPLANT
GAUZE SPONGE 4X4 12PLY STRL (GAUZE/BANDAGES/DRESSINGS) ×3 IMPLANT
GAUZE XEROFORM 1X8 LF (GAUZE/BANDAGES/DRESSINGS) ×3 IMPLANT
GLOVE SURG SYN 9.0  PF PI (GLOVE) ×1
GLOVE SURG SYN 9.0 PF PI (GLOVE) ×2 IMPLANT
GOWN SRG 2XL LVL 4 RGLN SLV (GOWNS) ×2 IMPLANT
GOWN STRL NON-REIN 2XL LVL4 (GOWNS) ×3
GOWN STRL REUS W/ TWL LRG LVL3 (GOWN DISPOSABLE) ×2 IMPLANT
GOWN STRL REUS W/TWL LRG LVL3 (GOWN DISPOSABLE) ×3
KIT TURNOVER KIT A (KITS) ×3 IMPLANT
NDL FILTER BLUNT 18X1 1/2 (NEEDLE) ×2 IMPLANT
NEEDLE FILTER BLUNT 18X 1/2SAF (NEEDLE) ×1
NEEDLE FILTER BLUNT 18X1 1/2 (NEEDLE) ×2 IMPLANT
NS IRRIG 1000ML POUR BTL (IV SOLUTION) ×3 IMPLANT
NS IRRIG 500ML POUR BTL (IV SOLUTION) ×3 IMPLANT
PACK EXTREMITY ARMC (MISCELLANEOUS) ×3 IMPLANT
PAD ABD DERMACEA PRESS 5X9 (GAUZE/BANDAGES/DRESSINGS) ×6 IMPLANT
PAD CAST CTTN 3X4 STRL (SOFTGOODS) ×2 IMPLANT
PADDING CAST COTTON 3X4 STRL (SOFTGOODS) ×2
SCALPEL PROTECTED #15 DISP (BLADE) ×6 IMPLANT
SPLINT CAST 1 STEP 3X12 (MISCELLANEOUS) ×3 IMPLANT
SPONGE SURGIFOAM ABS GEL 12-7 (HEMOSTASIS) ×1 IMPLANT
STAPLER SKIN PROX 35W (STAPLE) ×2 IMPLANT
SUT ETHIBOND NAB CT1 #1 30IN (SUTURE) ×2 IMPLANT
SUT ETHILON 3-0 FS-10 30 BLK (SUTURE)
SUT ETHILON 4-0 (SUTURE) ×3
SUT ETHILON 4-0 FS2 18XMFL BLK (SUTURE) ×2
SUT ETHILON 5-0 FS-2 18 BLK (SUTURE) ×2 IMPLANT
SUT MNCRL 4-0 (SUTURE)
SUT MNCRL 4-018XMFL (SUTURE)
SUT VIC AB 0 CT1 36 (SUTURE) ×3 IMPLANT
SUT VIC AB 2-0 CT1 27 (SUTURE)
SUT VIC AB 2-0 CT1 TAPERPNT 27 (SUTURE) ×2 IMPLANT
SUTURE EHLN 3-0 FS-10 30 BLK (SUTURE) ×2 IMPLANT
SUTURE ETHLN 4-0 FS2 18XMF BLK (SUTURE) ×2 IMPLANT
SUTURE MNCRL 4-018XMF (SUTURE) ×2 IMPLANT
SYR 10ML LL (SYRINGE) ×2 IMPLANT
SYSTEM IMPLANT TIGHTROPE MINI (Anchor) ×1 IMPLANT
WATER STERILE IRR 1000ML POUR (IV SOLUTION) ×3 IMPLANT

## 2018-09-03 NOTE — Progress Notes (Signed)
Pt CBG: 230. Dr. Andree Elk notified. Acknowledged. Orders received. See [facility]

## 2018-09-03 NOTE — Transfer of Care (Signed)
Immediate Anesthesia Transfer of Care Note  Patient: Annette Foster  Procedure(s) Performed: HARDWARE REMOVAL RIGHT THUMB (Right Thumb) REMOVAL GANGLION OF WRIST (Right Wrist) CARPOMETACARPEL ([place]) ARTHROPLASTY RIGHT THUMB (Right Thumb)  Patient Location: PACU  Anesthesia Type:General  Level of Consciousness: drowsy and responds to stimulation  Airway & Oxygen Therapy: Patient Spontanous Breathing and Patient connected to face mask oxygen  Post-op Assessment: Report given to RN and Post -op Vital signs reviewed and stable  Post vital signs: stable  Last Vitals:  Vitals Value Taken Time  BP 198/66 09/03/18 0915  Temp 36.1 C 09/03/18 0915  Pulse 66 09/03/18 0918  Resp 16 09/03/18 0918  SpO2 100 % 09/03/18 0918  Vitals shown include unvalidated device data.  Last Pain:  Vitals:   09/03/18 0915  TempSrc: Skin  PainSc: Asleep         Complications: No apparent anesthesia complications

## 2018-09-03 NOTE — Anesthesia Post-op Follow-up Note (Signed)
Anesthesia QCDR form completed.        

## 2018-09-03 NOTE — Op Note (Signed)
09/03/2018  9:12 AM  PATIENT:  Annette Foster  78 y.o. female  PRE-OPERATIVE DIAGNOSIS:  PAIN FROM IMPLANTED HARDWARE, GANGLION CYST DORSUM LEFT, ARTHRITITS CARPOMETACARPAL JOINT, RIGHT  POST-OPERATIVE DIAGNOSIS:  PAIN FROM IMPLANTED HARDWARE, GANGLION CYST DORSUM LEFT, ARTHRITITS CARPOMETACARPAL JOINT, RIGHT  PROCEDURE:  Procedure(s) with comments: HARDWARE REMOVAL RIGHT THUMB (Right) - staple removed REMOVAL GANGLION OF WRIST (Right) CARPOMETACARPEL ([place]) ARTHROPLASTY RIGHT THUMB (Right)  SURGEON: Laurene Footman, MD  ASSISTANTS: None  ANESTHESIA:   general  EBL:  Total I/O In: 800 [I.V.:800] Out: 5 [Blood:5]  BLOOD ADMINISTERED:none  DRAINS: none   LOCAL MEDICATIONS USED:  MARCAINE     SPECIMEN:  No Specimen  DISPOSITION OF SPECIMEN:  N/A  COUNTS:  YES  TOURNIQUET:   Total Tourniquet Time Documented: Upper Arm (Right) - 52 minutes Total: Upper Arm (Right) - 52 minutes   IMPLANTS: Mini tight rope  DICTATION: .Dragon Dictation patient was brought to the operating room and after adequate general anesthesia was obtained the right arm was prepped and draped in the usual sterile fashion.  After patient identification and timeout procedures were completed, tourniquet was raised to 250 mmHg.  The ganglion on the dorsum of the wrist was approached first through a transverse incision on the radial side of the dorsum approximately 2 cm in length subcutaneous tissue was spread and the ganglion was easily identified and excised with cauterization of the dorsal capsule to try to prevent recurrence the wound was irrigated and infiltrated with 5 cc of half percent Marcaine then closed with simple interrupted 5-0 nylon skin sutures attention was then turned to the thumb going through the previous incision along the radial border of the thumb CMC joint skin and subcutaneous tissue was divided there was extensive scar tissue and the staple was identified without difficulty when the  was entirely exposed it was removed without difficulty.  Next the capsule of the [facility] joint was opened and the trapezium separated from the scaphoid and metacarpal with the trapezium being removed in pieces with a rondure with extensive scar tissue around the joint it was more difficult than usual but with mini C arm assistance the trapezium was removed.  Next the wire was passed from the base of the first metacarpal to the proximal second metacarpal with the wire brought out on the dorsum of the second metacarpal the thumb was held in position as the anchor was passed for the mini tight rope and this anchor on the second metacarpal set with some thumb traction after checking this with the initial not there was no pistoning and the [facility] joint appeared stable.  The remaining knots were placed suture cut short and the wound closed with simple erupted 5-0 nylon going to the [facility] joint the tourniquet was let down to make sure there is no excessive bleeding there was not the wound was thoroughly irrigated and then packed with Gelfoam followed by a running 0 Vicryl to close the capsule followed by the 5-0 nylon simple interrupted for the skin.  Additional 10 cc of half percent Sensorcaine was infiltrated around the dorsal incision as well as the radial incision.  Xeroform 4 x 4 web roll and a thumb spica splint were applied followed by an Ace wrap  PLAN OF CARE: Discharge to home after PACU  PATIENT DISPOSITION:  PACU - hemodynamically stable.

## 2018-09-03 NOTE — Anesthesia Procedure Notes (Signed)
Procedure Name: Intubation Date/Time: 09/03/2018 7:38 AM Performed by: Esaw Grandchild, CRNA Pre-anesthesia Checklist: Patient identified, Emergency Drugs available, Suction available and Patient being monitored Patient Re-evaluated:Patient Re-evaluated prior to induction Oxygen Delivery Method: Circle system utilized Preoxygenation: Pre-oxygenation with 100% oxygen Induction Type: IV induction Ventilation: Mask ventilation without difficulty Laryngoscope Size: Mac and 3 Grade View: Grade I Tube type: Oral Tube size: 7.0 mm Number of attempts: 1 Airway Equipment and Method: Stylet and Oral airway Placement Confirmation: ETT inserted through vocal cords under direct vision,  positive ETCO2 and breath sounds checked- equal and bilateral Secured at: 22 cm Tube secured with: Tape Dental Injury: Teeth and Oropharynx as per pre-operative assessment

## 2018-09-03 NOTE — Progress Notes (Signed)
Pt CBG: 242. I spoke with Dr. Andree Elk. Acknowledged. Orders Received. See MAR.

## 2018-09-03 NOTE — Discharge Instructions (Addendum)
Keep arm elevated as much as possible.  Work on finger range of motion except for the thumb.  Ice to the back of the hand may help.  Pain medicine as directed.  If you have severe pain you can try to loosen the Ace wrap but leave the splint in under wrapping in place  [place]   1) The drugs that you were given will stay in your system until tomorrow so for the next 24 hours you should not:  A) Drive an automobile B) Make any legal decisions C) Drink any alcoholic beverage   2) You may resume regular meals tomorrow.  Today it is better to start with liquids and gradually work up to solid foods.  You may eat anything you prefer, but it is better to start with liquids, then soup and crackers, and gradually work up to solid foods.   3) Please notify your doctor immediately if you have any unusual bleeding, trouble breathing, redness and pain at the surgery site, drainage, fever, or pain not relieved by medication.    4) Additional Instructions:        Please contact your physician with any problems or Same Day Surgery at [phone], Monday through Friday 6 am to 4 pm, or [place] at [facility] number at [phone].

## 2018-09-03 NOTE — H&P (Signed)
Reviewed paper H+P, will be scanned into chart. No changes noted.  

## 2018-09-03 NOTE — Anesthesia Preprocedure Evaluation (Signed)
Anesthesia Evaluation  Patient identified by MRN, date of birth, ID band Patient awake    Reviewed: Allergy & Precautions, H&P , NPO status , Patient's Chart, lab work & pertinent test results, reviewed documented beta blocker date and time   History of Anesthesia Complications (+) PONV and history of anesthetic complications  Airway Mallampati: II  TM Distance: >3 FB Neck ROM: full    Dental  (+) Teeth Intact   Pulmonary sleep apnea ,    Pulmonary exam normal        Cardiovascular Exercise Tolerance: Good hypertension, On Medications +CHF  Normal cardiovascular exam+ Valvular Problems/Murmurs  Rate:Normal     Neuro/Psych negative neurological ROS  negative psych ROS   GI/Hepatic Neg liver ROS, GERD  Medicated,  Endo/Other  diabetes, Well Controlled, Type 1Hypothyroidism   Renal/GU negative Renal ROS  negative genitourinary   Musculoskeletal   Abdominal   Peds  Hematology  (+) Blood dyscrasia, anemia ,   Anesthesia Other Findings   Reproductive/Obstetrics negative OB ROS                             Anesthesia Physical Anesthesia Plan  ASA: III  Anesthesia Plan: General LMA   Post-op Pain Management:    Induction:   PONV Risk Score and Plan:   Airway Management Planned:   Additional Equipment:   Intra-op Plan:   Post-operative Plan:   Informed Consent: I have reviewed the patients History and Physical, chart, labs and discussed the procedure including the risks, benefits and alternatives for the proposed anesthesia with the patient or authorized representative who has indicated his/her understanding and acceptance.       Plan Discussed with: CRNA  Anesthesia Plan Comments: (Fentanyl allergy is presumed to be related to excess dosage.  Reversed successfully according to the patient.  JA)        Anesthesia Quick Evaluation

## 2018-09-22 NOTE — Anesthesia Postprocedure Evaluation (Signed)
Anesthesia Post Note  Patient: Zuri Lascala  Procedure(s) Performed: HARDWARE REMOVAL RIGHT THUMB (Right Thumb) REMOVAL GANGLION OF WRIST (Right Wrist) CARPOMETACARPEL ([place]) ARTHROPLASTY RIGHT THUMB (Right Thumb)  Patient location during evaluation: PACU Anesthesia Type: General Level of consciousness: awake and alert Pain management: pain level controlled Vital Signs Assessment: post-procedure vital signs reviewed and stable Respiratory status: spontaneous breathing, nonlabored ventilation, respiratory function stable and patient connected to nasal cannula oxygen Cardiovascular status: blood pressure returned to baseline and stable Postop Assessment: no apparent nausea or vomiting Anesthetic complications: no     Last Vitals:  Vitals:   09/03/18 1022 09/03/18 1055  BP: (!) 157/62 (!) 188/60  Pulse: 62 60  Resp: 18 18  Temp: (!) 36.2 C   SpO2: 92% 93%    Last Pain:  Vitals:   09/03/18 1022  TempSrc: Oral  PainSc: 0-No pain                 Molli Barrows

## 2018-10-01 ENCOUNTER — Other Ambulatory Visit: Payer: Self-pay

## 2018-10-02 ENCOUNTER — Other Ambulatory Visit: Payer: Self-pay

## 2018-10-02 ENCOUNTER — Encounter: Payer: Self-pay | Admitting: Internal Medicine

## 2018-10-02 ENCOUNTER — Inpatient Hospital Stay: Payer: Medicare Other | Attending: Internal Medicine

## 2018-10-02 ENCOUNTER — Ambulatory Visit: Payer: Medicare Other | Admitting: Occupational Therapy

## 2018-10-02 ENCOUNTER — Inpatient Hospital Stay (HOSPITAL_BASED_OUTPATIENT_CLINIC_OR_DEPARTMENT_OTHER): Payer: Medicare Other | Admitting: Internal Medicine

## 2018-10-02 VITALS — BP 146/73 | HR 71 | Temp 98.8°F | Resp 20 | Ht 63.0 in | Wt 170.0 lb

## 2018-10-02 DIAGNOSIS — Z90710 Acquired absence of both cervix and uterus: Secondary | ICD-10-CM | POA: Diagnosis not present

## 2018-10-02 DIAGNOSIS — Z794 Long term (current) use of insulin: Secondary | ICD-10-CM | POA: Diagnosis not present

## 2018-10-02 DIAGNOSIS — N952 Postmenopausal atrophic vaginitis: Secondary | ICD-10-CM | POA: Diagnosis not present

## 2018-10-02 DIAGNOSIS — K7581 Nonalcoholic steatohepatitis (NASH): Secondary | ICD-10-CM | POA: Diagnosis not present

## 2018-10-02 DIAGNOSIS — Z800 Family history of malignant neoplasm of digestive organs: Secondary | ICD-10-CM | POA: Diagnosis not present

## 2018-10-02 DIAGNOSIS — D0512 Intraductal carcinoma in situ of left breast: Secondary | ICD-10-CM

## 2018-10-02 DIAGNOSIS — Z806 Family history of leukemia: Secondary | ICD-10-CM | POA: Insufficient documentation

## 2018-10-02 DIAGNOSIS — Z7982 Long term (current) use of aspirin: Secondary | ICD-10-CM | POA: Insufficient documentation

## 2018-10-02 DIAGNOSIS — D61818 Other pancytopenia: Secondary | ICD-10-CM | POA: Diagnosis not present

## 2018-10-02 DIAGNOSIS — Z9012 Acquired absence of left breast and nipple: Secondary | ICD-10-CM | POA: Diagnosis not present

## 2018-10-02 DIAGNOSIS — I10 Essential (primary) hypertension: Secondary | ICD-10-CM | POA: Diagnosis not present

## 2018-10-02 DIAGNOSIS — E1165 Type 2 diabetes mellitus with hyperglycemia: Secondary | ICD-10-CM | POA: Diagnosis not present

## 2018-10-02 DIAGNOSIS — Z803 Family history of malignant neoplasm of breast: Secondary | ICD-10-CM | POA: Insufficient documentation

## 2018-10-02 DIAGNOSIS — K7460 Unspecified cirrhosis of liver: Secondary | ICD-10-CM | POA: Diagnosis not present

## 2018-10-02 DIAGNOSIS — Z8041 Family history of malignant neoplasm of ovary: Secondary | ICD-10-CM | POA: Diagnosis not present

## 2018-10-02 DIAGNOSIS — R161 Splenomegaly, not elsewhere classified: Secondary | ICD-10-CM

## 2018-10-02 DIAGNOSIS — Z7951 Long term (current) use of inhaled steroids: Secondary | ICD-10-CM | POA: Insufficient documentation

## 2018-10-02 DIAGNOSIS — E538 Deficiency of other specified B group vitamins: Secondary | ICD-10-CM | POA: Diagnosis not present

## 2018-10-02 DIAGNOSIS — Z8052 Family history of malignant neoplasm of bladder: Secondary | ICD-10-CM | POA: Insufficient documentation

## 2018-10-02 DIAGNOSIS — D731 Hypersplenism: Secondary | ICD-10-CM | POA: Diagnosis not present

## 2018-10-02 DIAGNOSIS — Z79899 Other long term (current) drug therapy: Secondary | ICD-10-CM | POA: Insufficient documentation

## 2018-10-02 LAB — COMPREHENSIVE METABOLIC PANEL
ALT: 20 U/L (ref 0–44)
AST: 32 U/L (ref 15–41)
Albumin: 3.8 g/dL (ref 3.5–5.0)
Alkaline Phosphatase: 78 U/L (ref 38–126)
Anion gap: 9 (ref 5–15)
BUN: 14 mg/dL (ref 8–23)
CO2: 24 mmol/L (ref 22–32)
Calcium: 9.4 mg/dL (ref 8.9–10.3)
Chloride: 103 mmol/L (ref 98–111)
Creatinine, Ser: 0.62 mg/dL (ref 0.44–1.00)
GFR calc Af Amer: >60 mL/min (ref >60)
GFR calc non Af Amer: >60 mL/min (ref >60)
Glucose, Bld: 291 mg/dL — ABNORMAL HIGH (ref 70–99)
Potassium: 4.1 mmol/L (ref 3.5–5.1)
Sodium: 136 mmol/L (ref 135–145)
Total Bilirubin: 0.6 mg/dL (ref 0.3–1.2)
Total Protein: 8.1 g/dL (ref 6.5–8.1)

## 2018-10-02 LAB — CBC WITH DIFFERENTIAL/PLATELET
Abs Immature Granulocytes: 0.01 10*3/uL (ref 0.00–0.07)
Basophils Absolute: 0 10*3/uL (ref 0.0–0.1)
Basophils Relative: 1 %
Eosinophils Absolute: 0.1 10*3/uL (ref 0.0–0.5)
Eosinophils Relative: 4 %
HCT: 29.1 % — ABNORMAL LOW (ref 36.0–46.0)
Hemoglobin: 9.2 g/dL — ABNORMAL LOW (ref 12.0–15.0)
Immature Granulocytes: 0 %
Lymphocytes Relative: 41 %
Lymphs Abs: 0.9 10*3/uL (ref 0.7–4.0)
MCH: 26.7 pg (ref 26.0–34.0)
MCHC: 31.6 g/dL (ref 30.0–36.0)
MCV: 84.6 fL (ref 80.0–100.0)
Monocytes Absolute: 0.2 10*3/uL (ref 0.1–1.0)
Monocytes Relative: 10 %
Neutro Abs: 1 10*3/uL — ABNORMAL LOW (ref 1.7–7.7)
Neutrophils Relative %: 44 %
Platelets: 90 10*3/uL — ABNORMAL LOW (ref 150–400)
RBC: 3.44 MIL/uL — ABNORMAL LOW (ref 3.87–5.11)
RDW: 14.6 % (ref 11.5–15.5)
WBC: 2.3 10*3/uL — ABNORMAL LOW (ref 4.0–10.5)
nRBC: 0 % (ref 0.0–0.2)

## 2018-10-02 NOTE — Assessment & Plan Note (Addendum)
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  Mammogram September 2019/biopsy-dystrophic calcifications.  Mammogram this year pending.  Discontinue letrozole because of poor tolerance.   #Vaginal atrophy-on estradiol topical as needed.  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  ANC 1.0.  Platelets 90.  Overall stable.  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  #Diabetes poorly controlled-random blood sugar- 293; poorly controlled.  # DISPOSITION:  # follow up in 6 months-MD-cbc/cmp/AFP; [place] prior-Dr.B  Dr. Tate 

## 2018-10-02 NOTE — Progress Notes (Signed)
Patient here for breast cancer follow-up. She is doing well. Socially, she is having some coping issues due to separation anxiety from her husband, who now lives in [place]. Patient tearful in conversation about missing her husband. However, due to caregiver strain, she knows he is in the best place for his care.  She recently underwent surgery on her right wrist to remove screws and plates and a ganglion cyst.

## 2018-10-02 NOTE — Progress Notes (Signed)
[place] OFFICE PROGRESS NOTE  Patient Care Team: Albina Billet, MD as PCP - General (Internal Medicine)  Cancer Staging No matching staging information was found for the patient.   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 OCT 2018- BMBx- MILD dyspoiesis; variable cellularity [10-50%]; FISH/Cytogenetics-NORMAL; F-One-NGS-declined by insurance.    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 Brandon/uro-gyne,[facility]]  # HRT [clinical trial thru [facility]; stopped July 2017 ]; CPAP   DIAGNOSIS: Left breast DCIS  STAGE:    0     ;GOALS: cure  CURRENT/MOST RECENT THERAPY: surveillaince    Ductal carcinoma in situ (DCIS) of left breast    INTERVAL HISTORY:  Annette Foster 78 y.o.  female pleasant patient above history of DCIS; cirrhosis portal hypertension is here for follow-up.  Patient is currently discontinued letrozole given poor tolerance.  Patient is on topical estradiol because of vaginal atrophy.  Patient has not had a recent UTIs.  She is clinically improved.  Patient continues to be a lot of stress given her husband's ill health.  Otherwise denies any recent infections.  Denies any bleeding.  No swelling in the legs.  No nausea vomiting.  No black or stools.  Review of Systems  Constitutional: Positive for malaise/fatigue. Negative for chills, diaphoresis, fever and weight loss.  HENT: Negative for nosebleeds and sore throat.   Eyes: Negative for double vision.  Respiratory: Negative for cough, hemoptysis, sputum production, shortness of breath and wheezing.   Cardiovascular: Negative for chest pain,  palpitations, orthopnea and leg swelling.  Gastrointestinal: Negative for abdominal pain, blood in stool, constipation, diarrhea, heartburn, melena, nausea and vomiting.  Genitourinary: Positive for dysuria, frequency and urgency.  Musculoskeletal: Positive for back pain. Negative for joint pain.  Skin: Negative.  Negative for itching and rash.  Neurological: Negative for dizziness, tingling, focal weakness, weakness and headaches.  Endo/Heme/Allergies: Does not bruise/bleed easily.  Psychiatric/Behavioral: Negative for depression. The patient is not nervous/anxious and does not have insomnia.       PAST MEDICAL HISTORY :  Past Medical History:  Diagnosis Date  . Abdominal pain   . Allergy   . Cancer [facility]) 2017   breast cancer- Left  . Cataract   . CHF (congestive heart failure) ([place])   . Collagenous colitis   . Diabetes mellitus without complication ([place])   . Diarrhea   . Diverticulosis   . Fatty liver disease, nonalcoholic   . Fibrocystic breast   . GERD (gastroesophageal reflux disease)   . Heart murmur   . Hyperlipidemia   . Hypertension   . Hypothyroidism   . IBS (irritable bowel syndrome)   . IBS (irritable bowel syndrome)   . IDA (iron deficiency anemia)   . Personal history of radiation therapy   . PONV (postoperative nausea and vomiting)   . Sleep apnea    C-Pap    PAST SURGICAL HISTORY :   Past Surgical History:  Procedure Laterality Date  . ABDOMINAL HYSTERECTOMY     [place] bso  . ABDOMINAL SURGERY    . APPENDECTOMY    . BREAST BIOPSY Bilateral 2016   negative  . BREAST 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/2015   DCIS, papillary carcinoma in  situ  . BREAST BIOPSY Left 05/27/2016   neg  . BREAST BIOPSY Left 11/20/2017   affirm bx x clip path pending  . BREAST LUMPECTOMY Left 10/17/2015  . CARDIAC SURGERY     has replacement valve  . CATARACT EXTRACTION Right   . CHOLECYSTECTOMY    . COLONOSCOPY WITH PROPOFOL N/A 03/11/2016   Procedure: COLONOSCOPY WITH PROPOFOL;   Surgeon: Manya Silvas, MD;  Location: [facility] ENDOSCOPY;  Service: Endoscopy;  Laterality: N/A;  . ESOPHAGOGASTRODUODENOSCOPY (EGD) WITH PROPOFOL N/A 03/11/2016   Procedure: ESOPHAGOGASTRODUODENOSCOPY (EGD) WITH PROPOFOL;  Surgeon: Manya Silvas, MD;  Location: [facility] ENDOSCOPY;  Service: Endoscopy;  Laterality: N/A;  . FINGER ARTHROSCOPY WITH CARPOMETACARPEL ([place]) ARTHROPLASTY Right 09/03/2018   Procedure: CARPOMETACARPEL [facility]) ARTHROPLASTY RIGHT THUMB;  Surgeon: Hessie Knows, MD;  Location: [facility] ORS;  Service: Orthopedics;  Laterality: Right;  . GANGLION CYST EXCISION Right 09/03/2018   Procedure: REMOVAL GANGLION OF WRIST;  Surgeon: Hessie Knows, MD;  Location: [facility] ORS;  Service: Orthopedics;  Laterality: Right;  . HARDWARE REMOVAL Right 09/03/2018   Procedure: HARDWARE REMOVAL RIGHT THUMB;  Surgeon: Hessie Knows, MD;  Location: [facility] ORS;  Service: Orthopedics;  Laterality: Right;  staple removed  . JOINT REPLACEMENT Left    TKR  . left sinusplasty     . MASTECTOMY, PARTIAL Left 10/17/2015   Procedure: MASTECTOMY PARTIAL REVISION;  Surgeon: Leonie Green, MD;  Location: [facility] ORS;  Service: General;  Laterality: Left;  . PARTIAL MASTECTOMY WITH NEEDLE LOCALIZATION Left 09/29/2015   Procedure: PARTIAL MASTECTOMY WITH NEEDLE LOCALIZATION;  Surgeon: Leonie Green, MD;  Location: [facility] ORS;  Service: General;  Laterality: Left;  . TOTAL ABDOMINAL HYSTERECTOMY W/ BILATERAL SALPINGOOPHORECTOMY      FAMILY HISTORY :   Family History  Problem Relation Age of Onset  . Breast cancer Paternal Grandmother   . Colon cancer Father   . Diabetes Sister   . Diabetes Brother   . Heart disease Brother   . Prostate cancer Brother   . Colon cancer Maternal Uncle   . Prostate cancer Brother   . Bladder Cancer Brother   . Leukemia Mother        all  . Ovarian cancer Neg Hx   . Kidney cancer Neg Hx     SOCIAL HISTORY:   Social History   Tobacco Use  . Smoking status: Never Smoker  .  Smokeless tobacco: Never Used  Substance Use Topics  . Alcohol use: No    Alcohol/week: 0.0 standard drinks  . Drug use: No    ALLERGIES:  is allergic to codeine; demeclocycline; demerol [meperidine]; hydrocodone; oxycodone; pentazocine; tetracyclines & related; and fentanyl.  MEDICATIONS:  Current Outpatient Medications  Medication Sig Dispense Refill  . aspirin 81 MG tablet Take 81 mg by mouth at bedtime.    . benzonatate (TESSALON) 200 MG capsule Take 200 mg by mouth 3 (three) times daily as needed for cough.    . budesonide-formoterol (SYMBICORT) 80-4.5 MCG/ACT inhaler Inhale 2 puffs into the lungs 2 (two) times daily as needed (shortness of breath).     . Cholecalciferol (VITAMIN D) 50 MCG (2000 UT) CAPS Take 2,000 Units by mouth daily.    . Cyanocobalamin (B-12 COMPLIANCE INJECTION) 1000 MCG/ML KIT Inject 1,000 mcg as directed every 30 (thirty) days.     Marland Kitchen escitalopram (LEXAPRO) 5 MG tablet Take 5 mg by mouth at bedtime.    Marland Kitchen esomeprazole (NEXIUM) 40 MG capsule Take 40 mg by mouth 2 (two) times daily  before a meal.     . fenofibrate 160 MG tablet Take 160 mg by mouth at bedtime.    . fexofenadine (ALLEGRA) 180 MG tablet Take 180 mg by mouth at bedtime.     . fluticasone (FLONASE) 50 MCG/ACT nasal spray Place 1 spray into the nose daily as needed for allergies.     . furosemide (LASIX) 20 MG tablet Take 20-40 mg by mouth See admin instructions. Take once a day. alternate taking '20mg'$  and '40mg'$ .    . gabapentin (NEURONTIN) 300 MG capsule Take 300 mg by mouth at bedtime.     Marland Kitchen glimepiride (AMARYL) 2 MG tablet Take 2 mg by mouth at bedtime.    Marland Kitchen HUMALOG KWIKPEN 100 UNIT/ML KwikPen Inject 8 Units into the skin 3 (three) times daily. Before meals    . Hyoscyamine Sulfate 0.375 MG TBCR Take 0.375 mg by mouth 2 (two) times daily.     . Insulin Degludec (TRESIBA FLEXTOUCH [place]) Inject 44 Units into the skin at bedtime.     Marland Kitchen levothyroxine (SYNTHROID, LEVOTHROID) 75 MCG tablet Take 75 mcg by  mouth daily before breakfast.     . magnesium oxide (MAG-OX) 400 MG tablet Take 400 mg by mouth 2 (two) times daily.     . meloxicam (MOBIC) 15 MG tablet Take 15 mg by mouth daily.    . metFORMIN (GLUCOPHAGE-XR) 750 MG 24 hr tablet Take 750 mg by mouth 2 (two) times daily.    . montelukast (SINGULAIR) 10 MG tablet Take 10 mg by mouth at bedtime.     Marland Kitchen olopatadine (PATANOL) 0.1 % ophthalmic solution Place 1 drop into both eyes 2 (two) times daily as needed for allergies.     Marland Kitchen ondansetron (ZOFRAN) 4 MG tablet Take 4 mg by mouth every 8 (eight) hours as needed for nausea or vomiting.   1  . potassium chloride SA (K-DUR,KLOR-CON) 20 MEQ tablet Take 20 mEq by mouth 2 (two) times daily.     Marland Kitchen saccharomyces boulardii (FLORASTOR) 250 MG capsule Take 250 mg by mouth 2 (two) times daily.     . simvastatin (ZOCOR) 20 MG tablet Take 20 mg by mouth at bedtime.    . traMADol (ULTRAM) 50 MG tablet Take 1-2 tablets (50-100 mg total) by mouth every 6 (six) hours as needed. 35 tablet 0  . valACYclovir (VALTREX) 500 MG tablet Take 500 mg by mouth See admin instructions. For fever blisters take '500mg'$  twice a day.  1  . valsartan (DIOVAN) 160 MG tablet Take 160 mg by mouth every morning.      No current facility-administered medications for this visit.     PHYSICAL EXAMINATION: ECOG PERFORMANCE STATUS: 1 - Symptomatic but completely ambulatory  BP (!) 146/73 (BP Location: Right Arm, Patient Position: Sitting, Cuff Size: Normal)   Pulse 71   Temp 98.8 F (37.1 C) (Tympanic)   Resp 20   Ht '5\' 3"'$  (1.6 m)   Wt 170 lb (77.1 kg)   LMP  (LMP Unknown)   BMI 30.11 kg/m   Filed Weights   10/02/18 1407  Weight: 170 lb (77.1 kg)    Physical Exam  Constitutional: She is oriented to person, place, and time and well-developed, well-nourished, and in no distress.  HENT:  Head: Normocephalic and atraumatic.  Mouth/Throat: Oropharynx is clear and moist. No oropharyngeal exudate.  Eyes: Pupils are equal, round,  and reactive to light.  Neck: Normal range of motion. Neck supple.  Cardiovascular: Normal rate and regular rhythm.  Pulmonary/Chest: No respiratory distress. She has no wheezes.  Abdominal: Soft. Bowel sounds are normal. She exhibits no distension and no mass. There is no abdominal tenderness. There is no rebound and no guarding.  Musculoskeletal: Normal range of motion.        General: No tenderness or edema.  Neurological: She is alert and oriented to person, place, and time.  Skin: Skin is warm.  Right and left BREAST exam (in the presence of nurse)- no unusual skin changes or dominant masses felt. Surgical scars noted.   Psychiatric: Affect normal.     LABORATORY DATA:  I have reviewed the data as listed    Component Value Date/Time   NA 136 10/02/2018 1326   K 4.1 10/02/2018 1326   CL 103 10/02/2018 1326   CO2 24 10/02/2018 1326   GLUCOSE 291 (H) 10/02/2018 1326   BUN 14 10/02/2018 1326   CREATININE 0.62 10/02/2018 1326   CALCIUM 9.4 10/02/2018 1326   PROT 8.1 10/02/2018 1326   ALBUMIN 3.8 10/02/2018 1326   AST 32 10/02/2018 1326   ALT 20 10/02/2018 1326   ALKPHOS 78 10/02/2018 1326   BILITOT 0.6 10/02/2018 1326   GFRNONAA >60 10/02/2018 1326   GFRAA >60 10/02/2018 1326    No results found for: SPEP, UPEP  Lab Results  Component Value Date   WBC 2.3 (L) 10/02/2018   NEUTROABS 1.0 (L) 10/02/2018   HGB 9.2 (L) 10/02/2018   HCT 29.1 (L) 10/02/2018   MCV 84.6 10/02/2018   PLT 90 (L) 10/02/2018      Chemistry      Component Value Date/Time   NA 136 10/02/2018 1326   K 4.1 10/02/2018 1326   CL 103 10/02/2018 1326   CO2 24 10/02/2018 1326   BUN 14 10/02/2018 1326   CREATININE 0.62 10/02/2018 1326      Component Value Date/Time   CALCIUM 9.4 10/02/2018 1326   ALKPHOS 78 10/02/2018 1326   AST 32 10/02/2018 1326   ALT 20 10/02/2018 1326   BILITOT 0.6 10/02/2018 1326       RADIOGRAPHIC STUDIES: I have personally reviewed the radiological images as  listed and agreed with the findings in the report. No results found.   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  Mammogram September 2019/biopsy-dystrophic calcifications.  Mammogram this year pending.  Discontinue letrozole because of poor tolerance.   #Vaginal atrophy-on estradiol topical as needed.  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  ANC 1.0.  Platelets 90.  Overall stable.  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  #Diabetes poorly controlled-random blood sugar- 293; poorly controlled.  # DISPOSITION:  # follow up in 6 months-MD-cbc/cmp/AFP; [place] prior-Dr.B  Dr. Hall Busing   Orders Placed This Encounter  Procedures  . US Abdomen Complete    Standing Status:   Future    Standing Expiration Date:   10/02/2019    Order Specific Question:   Reason for Exam (SYMPTOM  OR DIAGNOSIS REQUIRED)    Answer:   cirrhosis/splenomegaly    Order Specific Question:   Preferred imaging location?    Answer:   [facility]  . CBC with Differential    Standing Status:   Future    Standing Expiration Date:   10/02/2019  . Comprehensive metabolic panel    Standing Status:   Future    Standing Expiration Date:   10/02/2019  .  AFP tumor marker    Standing Status:   Future    Standing Expiration Date:   10/02/2019   All questions were answered. The patient knows to call the clinic with any problems, questions or concerns.      Cammie Sickle, MD 10/04/2018 10:53 AM

## 2018-10-12 ENCOUNTER — Ambulatory Visit: Payer: Medicare Other | Attending: Orthopedic Surgery | Admitting: Occupational Therapy

## 2018-10-12 ENCOUNTER — Encounter: Payer: Self-pay | Admitting: Occupational Therapy

## 2018-10-12 ENCOUNTER — Other Ambulatory Visit: Payer: Self-pay

## 2018-10-12 DIAGNOSIS — M6281 Muscle weakness (generalized): Secondary | ICD-10-CM | POA: Insufficient documentation

## 2018-10-12 DIAGNOSIS — M25641 Stiffness of right hand, not elsewhere classified: Secondary | ICD-10-CM | POA: Diagnosis not present

## 2018-10-12 DIAGNOSIS — M25631 Stiffness of right wrist, not elsewhere classified: Secondary | ICD-10-CM | POA: Diagnosis present

## 2018-10-12 NOTE — Patient Instructions (Signed)
Heat , circular AROM for thumb both ways AAROM for wrist flexion and extention  10reps  Rubber band for thumb PA and RA 12 reps  Putty light blue for gripping 10 reps  2 x day  Increase 3 days 2 sets if pain free and 6 days 3 sets

## 2018-10-12 NOTE — Therapy (Signed)
[place] [facility] [address] [place], [address] Phone: [phone]   Fax:  [phone]  Occupational Therapy Evaluation  Patient Details  Name: Annette Foster MRN: [id number] Date of Birth: 1940-07-11 Referring Provider (OT): [facility]    Encounter Date: 10/12/2018  OT End of Session - 10/12/18 1705    Visit Number  1    Number of Visits  6    OT Start Time  [id number]    OT Stop Time  1431    OT Time Calculation (min)  44 min    Activity Tolerance  Patient tolerated treatment well    Behavior During Therapy  [facility] for tasks assessed/performed       Past Medical History:  Diagnosis Date  . Abdominal pain   . Allergy   . Cancer [facility]) 2017   breast cancer- Left  . Cataract   . CHF (congestive heart failure) ([place])   . Collagenous colitis   . Diabetes mellitus without complication ([place])   . Diarrhea   . Diverticulosis   . Fatty liver disease, nonalcoholic   . Fibrocystic breast   . GERD (gastroesophageal reflux disease)   . Heart murmur   . Hyperlipidemia   . Hypertension   . Hypothyroidism   . IBS (irritable bowel syndrome)   . IBS (irritable bowel syndrome)   . IDA (iron deficiency anemia)   . Personal history of radiation therapy   . PONV (postoperative nausea and vomiting)   . Sleep apnea    C-Pap    Past Surgical History:  Procedure Laterality Date  . ABDOMINAL HYSTERECTOMY     tah bso  . ABDOMINAL SURGERY    . APPENDECTOMY    . BREAST BIOPSY Bilateral 2016   negative  . 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 BREAST BIOPSY Left 05/27/2016   neg  . BREAST BIOPSY Left 11/20/2017   affirm bx x clip path pending  . BREAST LUMPECTOMY Left 10/17/2015  . CARDIAC SURGERY     has replacement valve  . CATARACT EXTRACTION Right   . CHOLECYSTECTOMY    . COLONOSCOPY WITH PROPOFOL N/A 03/11/2016   Procedure: COLONOSCOPY WITH PROPOFOL;  Surgeon: Manya Silvas, MD;   Location: [facility] ENDOSCOPY;  Service: Endoscopy;  Laterality: N/A;  . ESOPHAGOGASTRODUODENOSCOPY (EGD) WITH PROPOFOL N/A 03/11/2016   Procedure: ESOPHAGOGASTRODUODENOSCOPY (EGD) WITH PROPOFOL;  Surgeon: Manya Silvas, MD;  Location: [facility] ENDOSCOPY;  Service: Endoscopy;  Laterality: N/A;  . FINGER ARTHROSCOPY WITH CARPOMETACARPEL ([place]) ARTHROPLASTY Right 09/03/2018   Procedure: CARPOMETACARPEL [facility]) ARTHROPLASTY RIGHT THUMB;  Surgeon: Hessie Knows, MD;  Location: [facility] ORS;  Service: Orthopedics;  Laterality: Right;  . GANGLION CYST EXCISION Right 09/03/2018   Procedure: REMOVAL GANGLION OF WRIST;  Surgeon: Hessie Knows, MD;  Location: [facility] ORS;  Service: Orthopedics;  Laterality: Right;  . HARDWARE REMOVAL Right 09/03/2018   Procedure: HARDWARE REMOVAL RIGHT THUMB;  Surgeon: Hessie Knows, MD;  Location: [facility] ORS;  Service: Orthopedics;  Laterality: Right;  staple removed  . JOINT REPLACEMENT Left    TKR  . left sinusplasty     . MASTECTOMY, PARTIAL Left 10/17/2015   Procedure: MASTECTOMY PARTIAL REVISION;  Surgeon: Leonie Green, MD;  Location: [facility] ORS;  Service: General;  Laterality: Left;  . PARTIAL MASTECTOMY WITH NEEDLE LOCALIZATION Left 09/29/2015   Procedure: PARTIAL MASTECTOMY WITH NEEDLE LOCALIZATION;  Surgeon: Leonie Green, MD;  Location: [facility] ORS;  Service: General;  Laterality: Left;  . TOTAL ABDOMINAL HYSTERECTOMY W/ BILATERAL SALPINGOOPHORECTOMY      There were no vitals filed for this visit.  Subjective Assessment - 10/12/18 1658    Subjective   I am doing well - they said I could take my splint off - and do some motion - I have been squeezing this light little ball - but have no pain    Pertinent History  Pt had some hardware removed , CMC arthroplasty , and cyst removed on 09/03/2018 - pt has not been sleeping with splint and taking it off about 50% of the time - use it little bit    Patient Stated Goals  I want to have the function and strenght back in my R thumb  without the pain I had    Currently in Pain?  No/denies        [facility] OT Assessment - 10/12/18 0001      Assessment   Medical Diagnosis  R CMC arthroplasty, hardware  and cyst removed     Referring Provider (OT)  MENZ     Onset Date/Surgical Date  09/03/18    Hand Dominance  Right      Precautions   Required Braces or Orthoses  --   thumb spica with resistance act      Home  Environment   Lives With  Alone      Prior Function   Vocation  Retired    Leisure  likes to read, games on phone, writes cards , own house work       AROM   Right Wrist Extension  65 Degrees    Right Wrist Flexion  75 Degrees    Left Wrist Extension  75 Degrees    Left Wrist Flexion  85 Degrees      Strength   Right Hand Grip (lbs)  24    Right Hand Lateral Pinch  7 lbs    Right Hand 3 Point Pinch  6 lbs    Left Hand Grip (lbs)  40    Left Hand Lateral Pinch  10 lbs    Left Hand 3 Point Pinch  9 lbs               OT Treatments/Exercises (OP) - 10/12/18 0001      RUE Fluidotherapy   Number Minutes Fluidotherapy  9 Minutes    RUE Fluidotherapy Location  Hand;Wrist    Comments  AROM in all planes for wrist and thumb piror to review of HEP      Pt appear to have stitch at proximal scar - pt appt with surgeon next week    Heat , circular AROM for thumb both ways AAROM for wrist flexion and extention  10reps  Rubber band for thumb PA and RA 12 reps  Putty light blue for gripping 10 reps  2 x day  Increase 3 days 2 sets if pain free and 6 days 3 sets      OT Education - 10/12/18 1704    Education Details  findings of eval and HEP    Person(s) Educated  Patient    Methods  Explanation;Demonstration;Tactile cues;Verbal cues;Handout    Comprehension  Verbalized understanding;Returned demonstration       OT Short Term Goals - 10/12/18 1709      OT SHORT TERM GOAL #1   Title  Pt to be independent in HEP to increase AROM in R  thumb and wrist to WNL , strength to use in  ADL's  more than 75 %    Baseline  AROM PA and RA decrease -wrist decrease - splint on for 50-% of time - n    Time  2    Period  Weeks    Status  New    Target Date  10/26/18      OT SHORT TERM GOAL #2   Title  R thumb and wrist AROM improve to [facility] to use more than 75% in ADL's and IADL's    Baseline  R thumb decrease 48 adn 45 degrees , IP 60 , MP 30    Time  2    Period  Weeks    Status  New    Target Date  10/26/18        OT Long Term Goals - 10/12/18 1711      OT LONG TERM GOAL #1   Title  Function score on PRWHE improve to less than 10/50    Baseline  NT - pt not using in heavy act - splint on still 50% of time    Time  6    Period  Weeks    Status  New    Target Date  11/23/18      OT LONG TERM GOAL #2   Title  L grip strenght improve with more than 5 lbs to peel, cut with knife , carry objects more than 5 lbs    Baseline  grip R 24, L 40 lbs    Time  6    Period  Weeks    Status  New    Target Date  11/23/18            Plan - 10/12/18 1706    Clinical Impression Statement  Pt present at OT eval 5 1/2 wks s/p R thumb CMC arthroplasty , hardware and cyst removed - pt wear thumb prefab spica about 50% of the day - show great progress in thumb AROM - still decrease in wrist AROM , thumb strength, and grip /prehension - denies pain - will cont to increase strength and functional use without increase pain    OT Occupational Profile and History  Problem Focused Assessment - Including review of records relating to presenting problem    Occupational performance deficits (Please refer to evaluation for details):  ADL's;IADL's;Play;Leisure    Body Structure / Function / Physical Skills  ADL;Flexibility;UE functional use;Scar mobility;Strength;IADL;Dexterity;ROM    Rehab Potential  Good    Clinical Decision Making  Limited treatment options, no task modification necessary    Comorbidities Affecting Occupational Performance:  None    Modification or Assistance to Complete  Evaluation   No modification of tasks or assist necessary to complete eval    OT Frequency  1x / week    OT Duration  6 weeks    OT Treatment/Interventions  Self-care/ADL training;Therapeutic exercise;Patient/family education;Splinting;Paraffin;Fluidtherapy;Manual Therapy;Passive range of motion;Scar mobilization    Plan  assess progress in ROM , strength with HEP    OT Home Exercise Plan  see pt instruction    Consulted and Agree with Plan of Care  Patient       Patient will benefit from skilled therapeutic intervention in order to improve the following deficits and impairments:   Body Structure / Function / Physical Skills: ADL, Flexibility, UE functional use, Scar mobility, Strength, IADL, Dexterity, ROM       Visit Diagnosis: Stiffness of right hand, not elsewhere classified - Plan: Ot plan of care cert/re-cert  Stiffness  of right wrist, not elsewhere classified - Plan: Ot plan of care cert/re-cert  Muscle weakness (generalized) - Plan: Ot plan of care cert/re-cert    Problem List Patient Active Problem List   Diagnosis Date Noted  . 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 Recurrent UTI (urinary tract infection) 07/23/2017  . Nausea vomiting and diarrhea 01/11/2017  . Hypothyroidism 01/11/2017  . HTN (hypertension) 01/11/2017  . Diabetes ([place]) 01/11/2017  . GERD (gastroesophageal reflux disease) 01/11/2017  . Other pancytopenia ([place]) 11/14/2016  . 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 Status post hysterectomy 06/20/2016  . 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 Vaginal atrophy 01/12/2016  . 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    Rosalyn Gess OTR/L,CLT 10/12/2018, 5:15 PM  [place] [facility] [address], [place], [address] Phone: [phone]   Fax:  [phone]  Name: Annette Foster MRN: [id number] Date of Birth: 04-Mar-1940

## 2018-10-20 ENCOUNTER — Ambulatory Visit: Payer: Medicare Other | Admitting: Occupational Therapy

## 2018-10-22 ENCOUNTER — Ambulatory Visit: Payer: Medicare Other | Admitting: Podiatry

## 2018-10-26 ENCOUNTER — Other Ambulatory Visit: Payer: Self-pay

## 2018-10-26 ENCOUNTER — Ambulatory Visit: Payer: Medicare Other | Attending: Orthopedic Surgery | Admitting: Occupational Therapy

## 2018-10-26 DIAGNOSIS — M25631 Stiffness of right wrist, not elsewhere classified: Secondary | ICD-10-CM | POA: Diagnosis present

## 2018-10-26 DIAGNOSIS — M6281 Muscle weakness (generalized): Secondary | ICD-10-CM | POA: Insufficient documentation

## 2018-10-26 DIAGNOSIS — M25641 Stiffness of right hand, not elsewhere classified: Secondary | ICD-10-CM | POA: Diagnosis not present

## 2018-10-26 DIAGNOSIS — M791 Myalgia, unspecified site: Secondary | ICD-10-CM | POA: Insufficient documentation

## 2018-10-26 DIAGNOSIS — R278 Other lack of coordination: Secondary | ICD-10-CM | POA: Insufficient documentation

## 2018-10-26 NOTE — Therapy (Signed)
[place] [facility] [address], [place], [address] Phone: [phone]   Fax:  [phone]  Occupational Therapy Treatment  Patient Details  Name: Annette Foster MRN: [id number] Date of Birth: 1941/01/04 Referring Provider (OT): [facility]    Encounter Date: 10/26/2018  OT End of Session - 10/26/18 1530    Visit Number  2    Number of Visits  6    Date for OT Re-Evaluation  11/23/18    OT Start Time  [id number]    OT Stop Time  1530    OT Time Calculation (min)  45 min    Activity Tolerance  Patient tolerated treatment well    Behavior During Therapy  [facility] for tasks assessed/performed       Past Medical History:  Diagnosis Date  . Abdominal pain   . Allergy   . Cancer [facility]) 2017   breast cancer- Left  . Cataract   . CHF (congestive heart failure) ([place])   . Collagenous colitis   . Diabetes mellitus without complication ([place])   . Diarrhea   . Diverticulosis   . Fatty liver disease, nonalcoholic   . Fibrocystic breast   . GERD (gastroesophageal reflux disease)   . Heart murmur   . Hyperlipidemia   . Hypertension   . Hypothyroidism   . IBS (irritable bowel syndrome)   . IBS (irritable bowel syndrome)   . IDA (iron deficiency anemia)   . Personal history of radiation therapy   . PONV (postoperative nausea and vomiting)   . Sleep apnea    C-Pap    Past Surgical History:  Procedure Laterality Date  . ABDOMINAL HYSTERECTOMY     tah bso  . ABDOMINAL SURGERY    . APPENDECTOMY    . BREAST BIOPSY Bilateral 2016   negative  . 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 BREAST BIOPSY Left 05/27/2016   neg  . BREAST BIOPSY Left 11/20/2017   affirm bx x clip path pending  . BREAST LUMPECTOMY Left 10/17/2015  . CARDIAC SURGERY     has replacement valve  . CATARACT EXTRACTION Right   . CHOLECYSTECTOMY    . COLONOSCOPY WITH PROPOFOL N/A 03/11/2016   Procedure: COLONOSCOPY WITH  PROPOFOL;  Surgeon: Manya Silvas, MD;  Location: [facility] ENDOSCOPY;  Service: Endoscopy;  Laterality: N/A;  . ESOPHAGOGASTRODUODENOSCOPY (EGD) WITH PROPOFOL N/A 03/11/2016   Procedure: ESOPHAGOGASTRODUODENOSCOPY (EGD) WITH PROPOFOL;  Surgeon: Manya Silvas, MD;  Location: [facility] ENDOSCOPY;  Service: Endoscopy;  Laterality: N/A;  . FINGER ARTHROSCOPY WITH CARPOMETACARPEL ([place]) ARTHROPLASTY Right 09/03/2018   Procedure: CARPOMETACARPEL [facility]) ARTHROPLASTY RIGHT THUMB;  Surgeon: Hessie Knows, MD;  Location: [facility] ORS;  Service: Orthopedics;  Laterality: Right;  . GANGLION CYST EXCISION Right 09/03/2018   Procedure: REMOVAL GANGLION OF WRIST;  Surgeon: Hessie Knows, MD;  Location: [facility] ORS;  Service: Orthopedics;  Laterality: Right;  . HARDWARE REMOVAL Right 09/03/2018   Procedure: HARDWARE REMOVAL RIGHT THUMB;  Surgeon: Hessie Knows, MD;  Location: [facility] ORS;  Service: Orthopedics;  Laterality: Right;  staple removed  . JOINT REPLACEMENT Left    TKR  . left sinusplasty     . MASTECTOMY, PARTIAL Left 10/17/2015   Procedure: MASTECTOMY PARTIAL REVISION;  Surgeon: Leonie Green, MD;  Location: [facility] ORS;  Service: General;  Laterality: Left;  . PARTIAL MASTECTOMY WITH NEEDLE LOCALIZATION Left 09/29/2015   Procedure: PARTIAL MASTECTOMY WITH NEEDLE LOCALIZATION;  Surgeon: Leonie Green,  MD;  Location: [facility] ORS;  Service: General;  Laterality: Left;  . TOTAL ABDOMINAL HYSTERECTOMY W/ BILATERAL SALPINGOOPHORECTOMY      There were no vitals filed for this visit.  Subjective Assessment - 10/26/18 1528    Subjective   My husband was 9 days in the hospital - had to cx Dr Rudene Christians appt too - but my thumb little sore and stay warm - but otherwise feels good- I could not do heat or ice while I was with him in the hospital    Pertinent History  Pt had some hardware removed , CMC arthroplasty , and cyst removed on 09/03/2018 - pt has not been sleeping with splint and taking it off about 50% of the time - use it  little bit    Patient Stated Goals  I want to have the function and strenght back in my R thumb without the pain I had    Currently in Pain?  Yes    Pain Score  2     Pain Location  Hand    Pain Orientation  Right    Pain Descriptors / Indicators  Aching    Pain Type  Surgical pain    Pain Onset  More than a month ago       Assess scar -appear to have stitch in there still -MC appear little tender , hot - but pt report hard splint cause some irritation and that her joint was always very large  Pt to see Dr Rudene Christians Friday  AROM of thumb PA and RA review again   review Opposition - to do sliding down 5th - but doing O             OT Treatments/Exercises (OP) - 10/26/18 0001      RUE Contrast Bath   Time  9 minutes    Comments  Prior to review of HEP to decrease pain      Done with pt some soft tissue with PA and RA stretch   And done and review  isometric strengthening to wrist in all planes  And thumb PA and RA - isometric  12 reps 2 x day  Increase to 2 sets 3 days  And 3 sets in 6 days  Pain free  Fit with CMC neoprene splint to wear during day with act that is hard to do or painful   but nothing at night time   cica scar pad for night time provided - ed on using it         OT Education - 10/26/18 1530    Education Details  progress , new HEP    Person(s) Educated  Patient    Methods  Explanation;Demonstration;Tactile cues;Verbal cues;Handout    Comprehension  Verbalized understanding;Returned demonstration       OT Short Term Goals - 10/12/18 1709      OT SHORT TERM GOAL #1   Title  Pt to be independent in HEP to increase AROM in R  thumb and wrist to WNL , strength to use in ADL's more than 75 %    Baseline  AROM PA and RA decrease -wrist decrease - splint on for 50-% of time - n    Time  2    Period  Weeks    Status  New    Target Date  10/26/18      OT SHORT TERM GOAL #2   Title  R thumb and wrist AROM improve to [facility] to use more  than 75% in  ADL's and IADL's    Baseline  R thumb decrease 48 adn 45 degrees , IP 60 , MP 30    Time  2    Period  Weeks    Status  New    Target Date  10/26/18        OT Long Term Goals - 10/12/18 1711      OT LONG TERM GOAL #1   Title  Function score on PRWHE improve to less than 10/50    Baseline  NT - pt not using in heavy act - splint on still 50% of time    Time  6    Period  Weeks    Status  New    Target Date  11/23/18      OT LONG TERM GOAL #2   Title  L grip strenght improve with more than 5 lbs to peel, cut with knife , carry objects more than 5 lbs    Baseline  grip R 24, L 40 lbs    Time  6    Period  Weeks    Status  New    Target Date  11/23/18            Plan - 10/26/18 1532    Clinical Impression Statement  Pt is about 7 1/2 wks s/p R thumb CMC arthroplasty , hardware and cyst removal - pt was seen 2 wks ago - had to cancel because of husband being in hospital - pt arrive this date with great AROM, but pain at rest and CMC little red and swollen - but report some irritation from hard splint - pt wean into neoprene CMC thumb wrap to wear with act that cause pain -and started isometric strenghtening - pain decrease to 0/10    OT Occupational Profile and History  Problem Focused Assessment - Including review of records relating to presenting problem    Occupational performance deficits (Please refer to evaluation for details):  ADL's;IADL's;Play;Leisure    Body Structure / Function / Physical Skills  ADL;Flexibility;UE functional use;Scar mobility;Strength;IADL;Dexterity;ROM    Rehab Potential  Good    Clinical Decision Making  Limited treatment options, no task modification necessary    Comorbidities Affecting Occupational Performance:  None    Modification or Assistance to Complete Evaluation   No modification of tasks or assist necessary to complete eval    OT Frequency  1x / week    OT Duration  6 weeks    OT Treatment/Interventions  Self-care/ADL  training;Therapeutic exercise;Patient/family education;Splinting;Paraffin;Fluidtherapy;Manual Therapy;Passive range of motion;Scar mobilization    Plan  assess progress in ROM , strength with HEP    OT Home Exercise Plan  see pt instruction    Consulted and Agree with Plan of Care  Patient       Patient will benefit from skilled therapeutic intervention in order to improve the following deficits and impairments:   Body Structure / Function / Physical Skills: ADL, Flexibility, UE functional use, Scar mobility, Strength, IADL, Dexterity, ROM       Visit Diagnosis: Stiffness of right hand, not elsewhere classified  Stiffness of right wrist, not elsewhere classified  Muscle weakness (generalized)    Problem List Patient Active Problem List   Diagnosis Date Noted  . 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 Recurrent UTI (urinary tract infection) 07/23/2017  . Nausea vomiting and diarrhea 01/11/2017  . Hypothyroidism 01/11/2017  . HTN (hypertension) 01/11/2017  . Diabetes ([place]) 01/11/2017  .  GERD (gastroesophageal reflux disease) 01/11/2017  . Other pancytopenia ([place]) 11/14/2016  . 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 Status post hysterectomy 06/20/2016  . 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 Vaginal atrophy 01/12/2016  . 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    Rosalyn Gess OTR/L,CLT 10/26/2018, 3:42 PM  [place] [facility] [address] [place], [address] Phone: [phone]   Fax:  [phone]  Name: Annette Foster MRN: [id number] Date of Birth: 03/28/1940

## 2018-10-26 NOTE — Patient Instructions (Signed)
Add isometric strengthening to wrist in all planes  And thumb PA and RA - isometric  12 reps 2 x day  Increase to 2 sets 3 days  And 3 sets in 6 days   Fit with CMC neoprene splint to wear during day with act that is hard to do or painful   but nothing at night time

## 2018-10-29 ENCOUNTER — Encounter: Payer: Self-pay | Admitting: Podiatry

## 2018-10-29 ENCOUNTER — Other Ambulatory Visit: Payer: Self-pay

## 2018-10-29 ENCOUNTER — Ambulatory Visit (INDEPENDENT_AMBULATORY_CARE_PROVIDER_SITE_OTHER): Payer: Medicare Other | Admitting: Podiatry

## 2018-10-29 DIAGNOSIS — M79674 Pain in right toe(s): Secondary | ICD-10-CM

## 2018-10-29 DIAGNOSIS — B351 Tinea unguium: Secondary | ICD-10-CM

## 2018-10-29 DIAGNOSIS — E1142 Type 2 diabetes mellitus with diabetic polyneuropathy: Secondary | ICD-10-CM

## 2018-10-29 DIAGNOSIS — M79675 Pain in left toe(s): Secondary | ICD-10-CM

## 2018-10-29 NOTE — Progress Notes (Signed)
Complaint:  Visit Type: Patient returns to my office for continued preventative foot care services. Complaint: Patient states" my nails have grown long and thick and become painful to walk and wear shoes" Patient has been diagnosed with DM with no foot complications. The patient presents for preventative foot care services. No changes to ROS  Podiatric Exam: Vascular: dorsalis pedis and posterior tibial pulses are palpable bilateral. Capillary return is immediate. Temperature gradient is WNL. Skin turgor WNL  Sensorium: Normal Semmes Weinstein monofilament test. Normal tactile sensation bilaterally. Nail Exam: Pt has thick disfigured discolored nails with subungual debris noted bilateral entire nail hallux through fifth toenails.    Surgery has healed. Ulcer Exam: There is no evidence of ulcer or pre-ulcerative changes or infection. Orthopedic Exam: Muscle tone and strength are WNL. No limitations in general ROM. No crepitus or effusions noted. Foot type and digits show no abnormalities. Bony prominences are unremarkable. Skin: No Porokeratosis. No infection or ulcers  Diagnosis:  Onychomycosis, , Pain in right toe, pain in left toes  Treatment & Plan Procedures and Treatment: Consent by patient was obtained for treatment procedures. The patient understood the discussion of treatment and procedures well. All questions were answered thoroughly reviewed. Debridement of mycotic and hypertrophic toenails, 1 through 5 bilateral and clearing of subungual debris. No ulceration, no infection noted.   Return Visit-Office Procedure: Patient instructed to return to the office for a follow up visit 4 months for continued evaluation and treatment.    Takoya Jonas DPM 

## 2018-11-02 ENCOUNTER — Other Ambulatory Visit: Payer: Self-pay

## 2018-11-02 ENCOUNTER — Ambulatory Visit: Payer: Medicare Other | Admitting: Occupational Therapy

## 2018-11-02 DIAGNOSIS — M25631 Stiffness of right wrist, not elsewhere classified: Secondary | ICD-10-CM

## 2018-11-02 DIAGNOSIS — M25641 Stiffness of right hand, not elsewhere classified: Secondary | ICD-10-CM | POA: Diagnosis not present

## 2018-11-02 DIAGNOSIS — M6281 Muscle weakness (generalized): Secondary | ICD-10-CM

## 2018-11-02 NOTE — Therapy (Signed)
[place] [facility] [address], [place], [address] Phone: [phone]   Fax:  [phone]  Occupational Therapy Treatment  Patient Details  Name: Annette Foster MRN: [id number] Date of Birth: December 16, 1940 Referring Provider (OT): [facility]    Encounter Date: 11/02/2018  OT End of Session - 11/02/18 1231    Visit Number  3    Number of Visits  6    Date for OT Re-Evaluation  11/23/18    OT Start Time  1100    OT Stop Time  1145    OT Time Calculation (min)  45 min    Activity Tolerance  Patient tolerated treatment well    Behavior During Therapy  [facility] for tasks assessed/performed       Past Medical History:  Diagnosis Date  . Abdominal pain   . Allergy   . Cancer [facility]) 2017   breast cancer- Left  . Cataract   . CHF (congestive heart failure) ([place])   . Collagenous colitis   . Diabetes mellitus without complication ([place])   . Diarrhea   . Diverticulosis   . Fatty liver disease, nonalcoholic   . Fibrocystic breast   . GERD (gastroesophageal reflux disease)   . Heart murmur   . Hyperlipidemia   . Hypertension   . Hypothyroidism   . IBS (irritable bowel syndrome)   . IBS (irritable bowel syndrome)   . IDA (iron deficiency anemia)   . Personal history of radiation therapy   . PONV (postoperative nausea and vomiting)   . Sleep apnea    C-Pap    Past Surgical History:  Procedure Laterality Date  . ABDOMINAL HYSTERECTOMY     tah bso  . ABDOMINAL SURGERY    . APPENDECTOMY    . BREAST BIOPSY Bilateral 2016   negative  . 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 BREAST BIOPSY Left 05/27/2016   neg  . BREAST BIOPSY Left 11/20/2017   affirm bx x clip path pending  . BREAST LUMPECTOMY Left 10/17/2015  . CARDIAC SURGERY     has replacement valve  . CATARACT EXTRACTION Right   . CHOLECYSTECTOMY    . COLONOSCOPY WITH PROPOFOL N/A 03/11/2016   Procedure: COLONOSCOPY WITH  PROPOFOL;  Surgeon: Manya Silvas, MD;  Location: [facility] ENDOSCOPY;  Service: Endoscopy;  Laterality: N/A;  . ESOPHAGOGASTRODUODENOSCOPY (EGD) WITH PROPOFOL N/A 03/11/2016   Procedure: ESOPHAGOGASTRODUODENOSCOPY (EGD) WITH PROPOFOL;  Surgeon: Manya Silvas, MD;  Location: [facility] ENDOSCOPY;  Service: Endoscopy;  Laterality: N/A;  . FINGER ARTHROSCOPY WITH CARPOMETACARPEL ([place]) ARTHROPLASTY Right 09/03/2018   Procedure: CARPOMETACARPEL [facility]) ARTHROPLASTY RIGHT THUMB;  Surgeon: Hessie Knows, MD;  Location: [facility] ORS;  Service: Orthopedics;  Laterality: Right;  . GANGLION CYST EXCISION Right 09/03/2018   Procedure: REMOVAL GANGLION OF WRIST;  Surgeon: Hessie Knows, MD;  Location: [facility] ORS;  Service: Orthopedics;  Laterality: Right;  . HARDWARE REMOVAL Right 09/03/2018   Procedure: HARDWARE REMOVAL RIGHT THUMB;  Surgeon: Hessie Knows, MD;  Location: [facility] ORS;  Service: Orthopedics;  Laterality: Right;  staple removed  . JOINT REPLACEMENT Left    TKR  . left sinusplasty     . MASTECTOMY, PARTIAL Left 10/17/2015   Procedure: MASTECTOMY PARTIAL REVISION;  Surgeon: Leonie Green, MD;  Location: [facility] ORS;  Service: General;  Laterality: Left;  . PARTIAL MASTECTOMY WITH NEEDLE LOCALIZATION Left 09/29/2015   Procedure: PARTIAL MASTECTOMY WITH NEEDLE LOCALIZATION;  Surgeon: Leonie Green,  MD;  Location: [facility] ORS;  Service: General;  Laterality: Left;  . TOTAL ABDOMINAL HYSTERECTOMY W/ BILATERAL SALPINGOOPHORECTOMY      There were no vitals filed for this visit.  Subjective Assessment - 11/02/18 1102    Subjective   Seen Dr Rudene Christians last Friday -happy with progress- I love the soft black splint -no pain and more strength to use it    Pertinent History  Pt had some hardware removed , CMC arthroplasty , and cyst removed on 09/03/2018 - pt has not been sleeping with splint and taking it off about 50% of the time - use it little bit    Patient Stated Goals  I want to have the function and strenght back in  my R thumb without the pain I had    Currently in Pain?  No/denies         [facility] OT Assessment - 11/02/18 0001      Strength   Right Hand Grip (lbs)  31    Right Hand Lateral Pinch  6 lbs    Right Hand 3 Point Pinch  9 lbs    Left Hand Grip (lbs)  40    Left Hand Lateral Pinch  10 lbs    Left Hand 3 Point Pinch  8 lbs        AROM of thumb PA and RA review again   review Opposition - to do sliding down 5th - but doing O                OT Treatments/Exercises (OP) - 11/02/18 0001      LUE Paraffin   Number Minutes Paraffin  8 Minutes    LUE Paraffin Location  Hand    Comments  prior to soft tissue mobs and AROM         Done with pt some soft tissue with PA and RA stretch - and scar massage -using xtractor 3 x and vibration  With AROM opposition    Pt to cont with AROM for wrist and thumb as warmup at home  And in clinic    rubber band for PA and RA 3 x 12 reps  Putty teal for gripping , lat and 3 point grip  2 x 12 reps  Pain free  And 16 oz hammer for wrist in all planes - 2 x 15 reps  Pain free  Increase in 3 days to  3 sets for all pain free   Cont with CMC neoprene splint to wear during day with act that is hard to do or painful   but nothing at night time       OT Education - 11/02/18 1230    Education Details  progress and upgrade HEP    Person(s) Educated  Patient    Methods  Explanation;Demonstration;Tactile cues;Verbal cues;Handout    Comprehension  Verbalized understanding;Returned demonstration       OT Short Term Goals - 10/12/18 1709      OT SHORT TERM GOAL #1   Title  Pt to be independent in HEP to increase AROM in R  thumb and wrist to WNL , strength to use in ADL's more than 75 %    Baseline  AROM PA and RA decrease -wrist decrease - splint on for 50-% of time - n    Time  2    Period  Weeks    Status  New    Target Date  10/26/18      OT SHORT  TERM GOAL #2   Title  R thumb and wrist AROM improve to [facility] to use more  than 75% in ADL's and IADL's    Baseline  R thumb decrease 48 adn 45 degrees , IP 60 , MP 30    Time  2    Period  Weeks    Status  New    Target Date  10/26/18        OT Long Term Goals - 10/12/18 1711      OT LONG TERM GOAL #1   Title  Function score on PRWHE improve to less than 10/50    Baseline  NT - pt not using in heavy act - splint on still 50% of time    Time  6    Period  Weeks    Status  New    Target Date  11/23/18      OT LONG TERM GOAL #2   Title  L grip strenght improve with more than 5 lbs to peel, cut with knife , carry objects more than 5 lbs    Baseline  grip R 24, L 40 lbs    Time  6    Period  Weeks    Status  New    Target Date  11/23/18            Plan - 11/02/18 1231    Clinical Impression Statement  Pt is 8 1/2 wks s/p R thumb CMC arthroplasty , hardware and cyst removal - arrive this date with no pain some stiffness in webspace - but great tolerance for strengthening - add this date weight for wrist and upgrade putty    OT Occupational Profile and History  Problem Focused Assessment - Including review of records relating to presenting problem    Occupational performance deficits (Please refer to evaluation for details):  ADL's;IADL's;Play;Leisure    Body Structure / Function / Physical Skills  ADL;Flexibility;UE functional use;Scar mobility;Strength;IADL;Dexterity;ROM    Rehab Potential  Good    Clinical Decision Making  Limited treatment options, no task modification necessary    Comorbidities Affecting Occupational Performance:  None    Modification or Assistance to Complete Evaluation   No modification of tasks or assist necessary to complete eval    OT Frequency  1x / week    OT Duration  6 weeks    OT Treatment/Interventions  Self-care/ADL training;Therapeutic exercise;Patient/family education;Splinting;Paraffin;Fluidtherapy;Manual Therapy;Passive range of motion;Scar mobilization    Plan  assess progress in ROM , strength with HEP    OT  Home Exercise Plan  see pt instruction    Consulted and Agree with Plan of Care  Patient       Patient will benefit from skilled therapeutic intervention in order to improve the following deficits and impairments:   Body Structure / Function / Physical Skills: ADL, Flexibility, UE functional use, Scar mobility, Strength, IADL, Dexterity, ROM       Visit Diagnosis: Stiffness of right hand, not elsewhere classified  Stiffness of right wrist, not elsewhere classified  Muscle weakness (generalized)    Problem List Patient Active Problem List   Diagnosis Date Noted  . 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 Recurrent UTI (urinary tract infection) 07/23/2017  . Nausea vomiting and diarrhea 01/11/2017  . Hypothyroidism 01/11/2017  . HTN (hypertension) 01/11/2017  . Diabetes ([place]) 01/11/2017  . GERD (gastroesophageal reflux disease) 01/11/2017  . Other pancytopenia ([place]) 11/14/2016  . 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  Status post hysterectomy 06/20/2016  . 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 Vaginal atrophy 01/12/2016  . 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    Rosalyn Gess OTR/L,CLT 11/02/2018, 12:40 PM  [place] [facility] [address] [place], [address] Phone: [phone]   Fax:  [phone]  Name: Cheryl-Ann Oakley MRN: [id number] Date of Birth: 1940/10/27

## 2018-11-02 NOTE — Patient Instructions (Signed)
Cont with AROM HEP   but add rubber band for PA and RA 3 x 12 reps  Putty teal for gripping , lat and 3 point grip  2 x 12 reps  Pain free  And 16 oz hammer for wrist in all planes - 2 x 15 reps  Pain free  Increase in 3 days to  3 sets for all pain free

## 2018-11-10 ENCOUNTER — Encounter: Payer: Self-pay | Admitting: Occupational Therapy

## 2018-11-10 ENCOUNTER — Encounter: Payer: Medicare Other | Admitting: Occupational Therapy

## 2018-11-10 ENCOUNTER — Other Ambulatory Visit: Payer: Self-pay

## 2018-11-10 ENCOUNTER — Ambulatory Visit: Payer: Medicare Other | Admitting: Occupational Therapy

## 2018-11-10 DIAGNOSIS — M791 Myalgia, unspecified site: Secondary | ICD-10-CM

## 2018-11-10 DIAGNOSIS — M6281 Muscle weakness (generalized): Secondary | ICD-10-CM

## 2018-11-10 DIAGNOSIS — M25641 Stiffness of right hand, not elsewhere classified: Secondary | ICD-10-CM

## 2018-11-10 DIAGNOSIS — R278 Other lack of coordination: Secondary | ICD-10-CM

## 2018-11-10 DIAGNOSIS — M25631 Stiffness of right wrist, not elsewhere classified: Secondary | ICD-10-CM

## 2018-11-11 NOTE — Therapy (Signed)
[place] [facility] [address] [place], [address] Phone: [phone]   Fax:  [phone]  Occupational Therapy Treatment  Patient Details  Name: Annette Foster MRN: [id number] Date of Birth: [birth year] Referring Provider (OT): [facility]    Encounter Date: 11/10/2018  OT End of Session - 11/10/18 1457    Visit Number  4    Number of Visits  6    Date for OT Re-Evaluation  11/23/18    OT Start Time  6503    OT Stop Time  1534    OT Time Calculation (min)  49 min    Activity Tolerance  Patient tolerated treatment well    Behavior During Therapy  [facility] for tasks assessed/performed       Past Medical History:  Diagnosis Date  . Abdominal pain   . Allergy   . Cancer [facility]) 2017   breast cancer- Left  . Cataract   . CHF (congestive heart failure) ([place])   . Collagenous colitis   . Diabetes mellitus without complication ([place])   . Diarrhea   . Diverticulosis   . Fatty liver disease, nonalcoholic   . Fibrocystic breast   . GERD (gastroesophageal reflux disease)   . Heart murmur   . Hyperlipidemia   . Hypertension   . Hypothyroidism   . IBS (irritable bowel syndrome)   . IBS (irritable bowel syndrome)   . IDA (iron deficiency anemia)   . Personal history of radiation therapy   . PONV (postoperative nausea and vomiting)   . Sleep apnea    C-Pap    Past Surgical History:  Procedure Laterality Date  . ABDOMINAL HYSTERECTOMY     [place] bso  . ABDOMINAL SURGERY    . APPENDECTOMY    . BREAST BIOPSY Bilateral 2016   negative  . 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 BREAST BIOPSY Left 05/27/2016   neg  . BREAST BIOPSY Left 11/20/2017   affirm bx x clip path pending  . BREAST LUMPECTOMY Left 10/17/2015  . CARDIAC SURGERY     has replacement valve  . CATARACT EXTRACTION Right   . CHOLECYSTECTOMY    . COLONOSCOPY WITH PROPOFOL N/A 03/11/2016   Procedure: COLONOSCOPY WITH  PROPOFOL;  Surgeon: Manya Silvas, MD;  Location: [facility] ENDOSCOPY;  Service: Endoscopy;  Laterality: N/A;  . ESOPHAGOGASTRODUODENOSCOPY (EGD) WITH PROPOFOL N/A 03/11/2016   Procedure: ESOPHAGOGASTRODUODENOSCOPY (EGD) WITH PROPOFOL;  Surgeon: Manya Silvas, MD;  Location: [facility] ENDOSCOPY;  Service: Endoscopy;  Laterality: N/A;  . FINGER ARTHROSCOPY WITH CARPOMETACARPEL ([place]) ARTHROPLASTY Right 09/03/2018   Procedure: CARPOMETACARPEL [facility]) ARTHROPLASTY RIGHT THUMB;  Surgeon: Hessie Knows, MD;  Location: [facility] ORS;  Service: Orthopedics;  Laterality: Right;  . GANGLION CYST EXCISION Right 09/03/2018   Procedure: REMOVAL GANGLION OF WRIST;  Surgeon: Hessie Knows, MD;  Location: [facility] ORS;  Service: Orthopedics;  Laterality: Right;  . HARDWARE REMOVAL Right 09/03/2018   Procedure: HARDWARE REMOVAL RIGHT THUMB;  Surgeon: Hessie Knows, MD;  Location: [facility] ORS;  Service: Orthopedics;  Laterality: Right;  staple removed  . JOINT REPLACEMENT Left    TKR  . left sinusplasty     . MASTECTOMY, PARTIAL Left 10/17/2015   Procedure: MASTECTOMY PARTIAL REVISION;  Surgeon: Leonie Green, MD;  Location: [facility] ORS;  Service: General;  Laterality: Left;  . PARTIAL MASTECTOMY WITH NEEDLE LOCALIZATION Left 09/29/2015   Procedure: PARTIAL MASTECTOMY WITH NEEDLE LOCALIZATION;  Surgeon: Leonie Green,  MD;  Location: [facility] ORS;  Service: General;  Laterality: Left;  . TOTAL ABDOMINAL HYSTERECTOMY W/ BILATERAL SALPINGOOPHORECTOMY      There were no vitals filed for this visit.  Subjective Assessment - 11/10/18 1456    Subjective   Patient reports she is doing well, exercises going good.    Pertinent History  Pt had some hardware removed , CMC arthroplasty , and cyst removed on 09/03/2018 - pt has not been sleeping with splint and taking it off about 50% of the time - use it little bit    Patient Stated Goals  I want to have the function and strenght back in my R thumb without the pain I had    Currently in Pain?   No/denies    Pain Score  0-No pain      Following paraffin, patient was seen for  Manual therapy with use of soft tissue work with PA and RA stretch - and scar massage to increase tissue mobility, decrease pain.  Use of vibration to scar.  AROM with oppositional movements.    Pt to cont with AROM for wrist and thumb as a part of home program  rubber band for PA and RA 3 x 12 reps  Putty teal for gripping , lat and 3 point grip , mixed next level of 1/2 green putty with 1/2 teal to increase resistance for patient.  Adjusted repetitions to 10 reps for 2 sets.   And 16 oz hammer for wrist in all planes - 3 x 10 reps (increased sets but may try 2# next session)  3 sets for all pain free   Review of HEP and adjustments made with sets and repetitions.    Reassessment of grip and pinch, see flow sheet for details.  Issued new piece of [employer] care for scar to wear at night.   Issued additional stockinette, with light compression (tubigrip)   Cont with CMC neoprene splint to wear during day with act that is hard to do or painful  but nothing at night time    Response to tx:  Patient is 9 weeks post op R thumb CMC arthroplasty with hardware removal.  Decreased pain and stiffness improving with exercises.  Upgraded sets and repetitions of some exercises today and increased resistance of theraputty this date for home program.  Patient continues to progress, will monitor exercises and make adjustments as needed.                        OT Education - 11/11/18 1926    Person(s) Educated  Patient    Methods  Explanation;Demonstration;Tactile cues;Verbal cues;Handout    Comprehension  Verbalized understanding;Returned demonstration       OT Short Term Goals - 10/12/18 1709      OT SHORT TERM GOAL #1   Title  Pt to be independent in HEP to increase AROM in R  thumb and wrist to WNL , strength to use in ADL's more than 75 %    Baseline  AROM PA and RA decrease -wrist  decrease - splint on for 50-% of time - n    Time  2    Period  Weeks    Status  New    Target Date  10/26/18      OT SHORT TERM GOAL #2   Title  R thumb and wrist AROM improve to [facility] to use more than 75% in ADL's and IADL's    Baseline  R thumb decrease 48 adn 45 degrees , IP 60 , MP 30    Time  2    Period  Weeks    Status  New    Target Date  10/26/18        OT Long Term Goals - 10/12/18 1711      OT LONG TERM GOAL #1   Title  Function score on PRWHE improve to less than 10/50    Baseline  NT - pt not using in heavy act - splint on still 50% of time    Time  6    Period  Weeks    Status  New    Target Date  11/23/18      OT LONG TERM GOAL #2   Title  L grip strenght improve with more than 5 lbs to peel, cut with knife , carry objects more than 5 lbs    Baseline  grip R 24, L 40 lbs    Time  6    Period  Weeks    Status  New    Target Date  11/23/18            Plan - 11/11/18 1926    Clinical Impression Statement  Patient is 9 weeks post op R thumb CMC arthroplasty with hardware removal.  Decreased pain and stiffness improving with exercises.  Upgraded sets and repetitions of some exercises today and increased resistance of theraputty this date for home program.  Patient continues to progress, will monitor exercises and make adjustments as needed.    OT Occupational Profile and History  Problem Focused Assessment - Including review of records relating to presenting problem    Occupational performance deficits (Please refer to evaluation for details):  ADL's;IADL's;Play;Leisure    Body Structure / Function / Physical Skills  ADL;Flexibility;UE functional use;Scar mobility;Strength;IADL;Dexterity;ROM    Rehab Potential  Good    Clinical Decision Making  Limited treatment options, no task modification necessary    Comorbidities Affecting Occupational Performance:  None    Modification or Assistance to Complete Evaluation   No modification of tasks or assist necessary  to complete eval    OT Frequency  1x / week    OT Duration  6 weeks    OT Treatment/Interventions  Self-care/ADL training;Therapeutic exercise;Patient/family education;Splinting;Paraffin;Fluidtherapy;Manual Therapy;Passive range of motion;Scar mobilization    Consulted and Agree with Plan of Care  Patient       Patient will benefit from skilled therapeutic intervention in order to improve the following deficits and impairments:   Body Structure / Function / Physical Skills: ADL, Flexibility, UE functional use, Scar mobility, Strength, IADL, Dexterity, ROM       Visit Diagnosis: Stiffness of right hand, not elsewhere classified  Stiffness of right wrist, not elsewhere classified  Muscle weakness (generalized)  Myalgia  Other lack of coordination    Problem List Patient Active Problem List   Diagnosis Date Noted  . 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 Recurrent UTI (urinary tract infection) 07/23/2017  . Nausea vomiting and diarrhea 01/11/2017  . Hypothyroidism 01/11/2017  . HTN (hypertension) 01/11/2017  . Diabetes ([place]) 01/11/2017  . GERD (gastroesophageal reflux disease) 01/11/2017  . Other pancytopenia ([place]) 11/14/2016  . 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 Status post hysterectomy 06/20/2016  . 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 Vaginal atrophy 01/12/2016  . 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   Amy T Tomasita Morrow,  OTR/L, CLT  Lovett,Amy 11/11/2018, 8:53 PM  [place] [facility] [address] [place], [address] Phone: [phone]   Fax:  [phone]  Name: Annette Foster MRN: [id number] Date of Birth: 1940-03-26

## 2018-11-16 ENCOUNTER — Ambulatory Visit: Payer: Medicare Other | Attending: Orthopedic Surgery | Admitting: Occupational Therapy

## 2018-11-16 ENCOUNTER — Other Ambulatory Visit: Payer: Self-pay

## 2018-11-16 DIAGNOSIS — M79641 Pain in right hand: Secondary | ICD-10-CM | POA: Insufficient documentation

## 2018-11-16 DIAGNOSIS — R278 Other lack of coordination: Secondary | ICD-10-CM | POA: Diagnosis present

## 2018-11-16 DIAGNOSIS — M25631 Stiffness of right wrist, not elsewhere classified: Secondary | ICD-10-CM | POA: Diagnosis present

## 2018-11-16 DIAGNOSIS — M6281 Muscle weakness (generalized): Secondary | ICD-10-CM | POA: Diagnosis present

## 2018-11-16 DIAGNOSIS — M25641 Stiffness of right hand, not elsewhere classified: Secondary | ICD-10-CM | POA: Diagnosis not present

## 2018-11-16 NOTE — Therapy (Signed)
[place] [facility] [address], [place], [address] Phone: [phone]   Fax:  [phone]  Occupational Therapy Treatment  Patient Details  Name: Annette Foster MRN: [id number] Date of Birth: [birth year] Referring Provider (OT): [facility]    Encounter Date: 11/16/2018  OT End of Session - 11/16/18 1334    Visit Number  5    Number of Visits  6    Date for OT Re-Evaluation  11/23/18    OT Start Time  1300    OT Stop Time  1333    OT Time Calculation (min)  33 min    Activity Tolerance  Patient tolerated treatment well    Behavior During Therapy  [facility] for tasks assessed/performed       Past Medical History:  Diagnosis Date  . Abdominal pain   . Allergy   . Cancer [facility]) 2017   breast cancer- Left  . Cataract   . CHF (congestive heart failure) ([place])   . Collagenous colitis   . Diabetes mellitus without complication ([place])   . Diarrhea   . Diverticulosis   . Fatty liver disease, nonalcoholic   . Fibrocystic breast   . GERD (gastroesophageal reflux disease)   . Heart murmur   . Hyperlipidemia   . Hypertension   . Hypothyroidism   . IBS (irritable bowel syndrome)   . IBS (irritable bowel syndrome)   . IDA (iron deficiency anemia)   . Personal history of radiation therapy   . PONV (postoperative nausea and vomiting)   . Sleep apnea    C-Pap    Past Surgical History:  Procedure Laterality Date  . ABDOMINAL HYSTERECTOMY     tah bso  . ABDOMINAL SURGERY    . APPENDECTOMY    . BREAST BIOPSY Bilateral 2016   negative  . 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 BREAST BIOPSY Left 05/27/2016   neg  . BREAST BIOPSY Left 11/20/2017   affirm bx x clip path pending  . BREAST LUMPECTOMY Left 10/17/2015  . CARDIAC SURGERY     has replacement valve  . CATARACT EXTRACTION Right   . CHOLECYSTECTOMY    . COLONOSCOPY WITH PROPOFOL N/A 03/11/2016   Procedure: COLONOSCOPY WITH  PROPOFOL;  Surgeon: Manya Silvas, MD;  Location: [facility] ENDOSCOPY;  Service: Endoscopy;  Laterality: N/A;  . ESOPHAGOGASTRODUODENOSCOPY (EGD) WITH PROPOFOL N/A 03/11/2016   Procedure: ESOPHAGOGASTRODUODENOSCOPY (EGD) WITH PROPOFOL;  Surgeon: Manya Silvas, MD;  Location: [facility] ENDOSCOPY;  Service: Endoscopy;  Laterality: N/A;  . FINGER ARTHROSCOPY WITH CARPOMETACARPEL ([place]) ARTHROPLASTY Right 09/03/2018   Procedure: CARPOMETACARPEL [facility]) ARTHROPLASTY RIGHT THUMB;  Surgeon: Hessie Knows, MD;  Location: [facility] ORS;  Service: Orthopedics;  Laterality: Right;  . GANGLION CYST EXCISION Right 09/03/2018   Procedure: REMOVAL GANGLION OF WRIST;  Surgeon: Hessie Knows, MD;  Location: [facility] ORS;  Service: Orthopedics;  Laterality: Right;  . HARDWARE REMOVAL Right 09/03/2018   Procedure: HARDWARE REMOVAL RIGHT THUMB;  Surgeon: Hessie Knows, MD;  Location: [facility] ORS;  Service: Orthopedics;  Laterality: Right;  staple removed  . JOINT REPLACEMENT Left    TKR  . left sinusplasty     . MASTECTOMY, PARTIAL Left 10/17/2015   Procedure: MASTECTOMY PARTIAL REVISION;  Surgeon: Leonie Green, MD;  Location: [facility] ORS;  Service: General;  Laterality: Left;  . PARTIAL MASTECTOMY WITH NEEDLE LOCALIZATION Left 09/29/2015   Procedure: PARTIAL MASTECTOMY WITH NEEDLE LOCALIZATION;  Surgeon: Leonie Green,  MD;  Location: [facility] ORS;  Service: General;  Laterality: Left;  . TOTAL ABDOMINAL HYSTERECTOMY W/ BILATERAL SALPINGOOPHORECTOMY      There were no vitals filed for this visit.  Subjective Assessment - 11/16/18 1334    Subjective   I am doing well- little tender over scar - but otherwise using it more - more strength- use soft blac splint less than 25%    Pertinent History  Pt had some hardware removed , CMC arthroplasty , and cyst removed on 09/03/2018 - pt has not been sleeping with splint and taking it off about 50% of the time - use it little bit    Patient Stated Goals  I want to have the function and  strenght back in my R thumb without the pain I had    Currently in Pain?  No/denies         [facility] OT Assessment - 11/16/18 0001      Strength   Right Hand Grip (lbs)  35    Right Hand Lateral Pinch  7 lbs    Right Hand 3 Point Pinch  9 lbs    Left Hand Grip (lbs)  40    Left Hand Lateral Pinch  10 lbs    Left Hand 3 Point Pinch  9 lbs        measurements taken - strength for PA 4/5 and RA 4+/5   review rubber band - new one issued and review positioning of PA Opposition WFL -    Upgrade putty to green - with 1/2 teal For grip , lat and 3 point grip -2 x12 reps  2 x day  Tolerate well -no pain   Upgrade to 2 lbs for wrist in all planes  was not able to do 2nd set thru  Pt to upgrade every 3 days another set   start with 12 reps   2 x day -  Pain free  and increase functional use                 OT Education - 11/16/18 1334    Education Details  progress and upgrade HEP    Person(s) Educated  Patient    Methods  Explanation;Demonstration;Tactile cues;Verbal cues;Handout    Comprehension  Verbalized understanding;Returned demonstration       OT Short Term Goals - 10/12/18 1709      OT SHORT TERM GOAL #1   Title  Pt to be independent in HEP to increase AROM in R  thumb and wrist to WNL , strength to use in ADL's more than 75 %    Baseline  AROM PA and RA decrease -wrist decrease - splint on for 50-% of time - n    Time  2    Period  Weeks    Status  New    Target Date  10/26/18      OT SHORT TERM GOAL #2   Title  R thumb and wrist AROM improve to [facility] to use more than 75% in ADL's and IADL's    Baseline  R thumb decrease 48 adn 45 degrees , IP 60 , MP 30    Time  2    Period  Weeks    Status  New    Target Date  10/26/18        OT Long Term Goals - 10/12/18 1711      OT LONG TERM GOAL #1   Title  Function score on PRWHE improve to less  than 10/50    Baseline  NT - pt not using in heavy act - splint on still 50% of time    Time  6     Period  Weeks    Status  New    Target Date  11/23/18      OT LONG TERM GOAL #2   Title  L grip strenght improve with more than 5 lbs to peel, cut with knife , carry objects more than 5 lbs    Baseline  grip R 24, L 40 lbs    Time  6    Period  Weeks    Status  New    Target Date  11/23/18            Plan - 11/16/18 1335    Clinical Impression Statement  Pt is about 10 wks s/p R thumb CMC arthroplasty with hardware removal - pt show decrease pain and stiffness and increase strength- upgrade putty again this date and wrist to 2lbs    OT Occupational Profile and History  Problem Focused Assessment - Including review of records relating to presenting problem    Occupational performance deficits (Please refer to evaluation for details):  ADL's;IADL's;Play;Leisure    Body Structure / Function / Physical Skills  ADL;Flexibility;UE functional use;Scar mobility;Strength;IADL;Dexterity;ROM    Rehab Potential  Good    Clinical Decision Making  Limited treatment options, no task modification necessary    Comorbidities Affecting Occupational Performance:  None    Modification or Assistance to Complete Evaluation   No modification of tasks or assist necessary to complete eval    OT Frequency  1x / week    OT Duration  2 weeks    OT Treatment/Interventions  Self-care/ADL training;Therapeutic exercise;Patient/family education;Splinting;Paraffin;Fluidtherapy;Manual Therapy;Passive range of motion;Scar mobilization    Plan  assess progress in ROM , strength with HEP    OT Home Exercise Plan  see pt instruction    Consulted and Agree with Plan of Care  Patient       Patient will benefit from skilled therapeutic intervention in order to improve the following deficits and impairments:   Body Structure / Function / Physical Skills: ADL, Flexibility, UE functional use, Scar mobility, Strength, IADL, Dexterity, ROM       Visit Diagnosis: Stiffness of right hand, not elsewhere  classified  Muscle weakness (generalized)    Problem List Patient Active Problem List   Diagnosis Date Noted  . 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 Recurrent UTI (urinary tract infection) 07/23/2017  . Nausea vomiting and diarrhea 01/11/2017  . Hypothyroidism 01/11/2017  . HTN (hypertension) 01/11/2017  . Diabetes ([place]) 01/11/2017  . GERD (gastroesophageal reflux disease) 01/11/2017  . Other pancytopenia ([place]) 11/14/2016  . 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 Status post hysterectomy 06/20/2016  . 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 Vaginal atrophy 01/12/2016  . 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    Jalila Goodnough, Gwenette Greet OTR, L , CLT  11/16/2018, 1:37 PM  [place] [facility] [address], [place], [address] Phone: [phone]   Fax:  [phone]  Name: Letha Currey MRN: [id number] Date of Birth: 29-Aug-1940

## 2018-11-16 NOTE — Patient Instructions (Signed)
See note for upgrade

## 2018-11-23 ENCOUNTER — Ambulatory Visit: Payer: Medicare Other | Admitting: Occupational Therapy

## 2018-11-23 ENCOUNTER — Other Ambulatory Visit: Payer: Self-pay

## 2018-11-23 DIAGNOSIS — M6281 Muscle weakness (generalized): Secondary | ICD-10-CM

## 2018-11-23 DIAGNOSIS — M25641 Stiffness of right hand, not elsewhere classified: Secondary | ICD-10-CM | POA: Diagnosis not present

## 2018-11-23 DIAGNOSIS — M25631 Stiffness of right wrist, not elsewhere classified: Secondary | ICD-10-CM

## 2018-11-23 NOTE — Therapy (Signed)
[place] [facility] [address] [place], [address] Phone: [phone]   Fax:  [phone]  Occupational Therapy Treatment  Patient Details  Name: Annette Foster MRN: [id number] Date of Birth: [birth year] Referring Provider (OT): [facility]    Encounter Date: 11/23/2018  OT End of Session - 11/23/18 [id number]    Visit Number  6    Number of Visits  7    Date for OT Re-Evaluation  12/07/18    OT Start Time  1400    OT Stop Time  1430    OT Time Calculation (min)  30 min    Activity Tolerance  Patient tolerated treatment well    Behavior During Therapy  [facility] for tasks assessed/performed       Past Medical History:  Diagnosis Date  . Abdominal pain   . Allergy   . Cancer [facility]) 2017   breast cancer- Left  . Cataract   . CHF (congestive heart failure) ([place])   . Collagenous colitis   . Diabetes mellitus without complication ([place])   . Diarrhea   . Diverticulosis   . Fatty liver disease, nonalcoholic   . Fibrocystic breast   . GERD (gastroesophageal reflux disease)   . Heart murmur   . Hyperlipidemia   . Hypertension   . Hypothyroidism   . IBS (irritable bowel syndrome)   . IBS (irritable bowel syndrome)   . IDA (iron deficiency anemia)   . Personal history of radiation therapy   . PONV (postoperative nausea and vomiting)   . Sleep apnea    C-Pap    Past Surgical History:  Procedure Laterality Date  . ABDOMINAL HYSTERECTOMY     [place] bso  . ABDOMINAL SURGERY    . APPENDECTOMY    . BREAST BIOPSY Bilateral 2016   negative  . 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 BREAST BIOPSY Left 05/27/2016   neg  . BREAST BIOPSY Left 11/20/2017   affirm bx x clip path pending  . BREAST LUMPECTOMY Left 10/17/2015  . CARDIAC SURGERY     has replacement valve  . CATARACT EXTRACTION Right   . CHOLECYSTECTOMY    . COLONOSCOPY WITH PROPOFOL N/A 03/11/2016   Procedure: COLONOSCOPY WITH  PROPOFOL;  Surgeon: Manya Silvas, MD;  Location: [facility] ENDOSCOPY;  Service: Endoscopy;  Laterality: N/A;  . ESOPHAGOGASTRODUODENOSCOPY (EGD) WITH PROPOFOL N/A 03/11/2016   Procedure: ESOPHAGOGASTRODUODENOSCOPY (EGD) WITH PROPOFOL;  Surgeon: Manya Silvas, MD;  Location: [facility] ENDOSCOPY;  Service: Endoscopy;  Laterality: N/A;  . FINGER ARTHROSCOPY WITH CARPOMETACARPEL ([place]) ARTHROPLASTY Right 09/03/2018   Procedure: CARPOMETACARPEL [facility]) ARTHROPLASTY RIGHT THUMB;  Surgeon: Hessie Knows, MD;  Location: [facility] ORS;  Service: Orthopedics;  Laterality: Right;  . GANGLION CYST EXCISION Right 09/03/2018   Procedure: REMOVAL GANGLION OF WRIST;  Surgeon: Hessie Knows, MD;  Location: [facility] ORS;  Service: Orthopedics;  Laterality: Right;  . HARDWARE REMOVAL Right 09/03/2018   Procedure: HARDWARE REMOVAL RIGHT THUMB;  Surgeon: Hessie Knows, MD;  Location: [facility] ORS;  Service: Orthopedics;  Laterality: Right;  staple removed  . JOINT REPLACEMENT Left    TKR  . left sinusplasty     . MASTECTOMY, PARTIAL Left 10/17/2015   Procedure: MASTECTOMY PARTIAL REVISION;  Surgeon: Leonie Green, MD;  Location: [facility] ORS;  Service: General;  Laterality: Left;  . PARTIAL MASTECTOMY WITH NEEDLE LOCALIZATION Left 09/29/2015   Procedure: PARTIAL MASTECTOMY WITH NEEDLE LOCALIZATION;  Surgeon: Leonie Green,  MD;  Location: [facility] ORS;  Service: General;  Laterality: Left;  . TOTAL ABDOMINAL HYSTERECTOMY W/ BILATERAL SALPINGOOPHORECTOMY      There were no vitals filed for this visit.  Subjective Assessment - 11/23/18 1435    Subjective   I am doing better- no pain - using it more - not picking up heavy, heavy stuff - but like my bag and gallon of milk - what ever you tell me - I do    Pertinent History  Pt had some hardware removed , CMC arthroplasty , and cyst removed on 09/03/2018 - pt has not been sleeping with splint and taking it off about 50% of the time - use it little bit    Patient Stated Goals  I want to have  the function and strenght back in my R thumb without the pain I had    Currently in Pain?  No/denies         [facility] OT Assessment - 11/23/18 0001      AROM   Right Wrist Extension  70 Degrees    Right Wrist Flexion  85 Degrees    Left Wrist Extension  75 Degrees    Left Wrist Flexion  85 Degrees      Strength   Right Hand Grip (lbs)  40    Right Hand Lateral Pinch  8 lbs    Right Hand 3 Point Pinch  11 lbs    Left Hand Grip (lbs)  40    Left Hand Lateral Pinch  11 lbs    Left Hand 3 Point Pinch  9 lbs      strength in thumb PA and RA 4+/5 - upgrade HEP to 2 band for thumb PA and RA  Pain free  Wrist strength in all planes 5/5 - stop HEP   2 bands for thumb PA , RA -12 reps  2 x day and increase sets over the next 10 days   And review putty HEP again  green/teal mix     putty 2 x day  3 sets of 12 gripping , lat and 3 point grip    done PRWHE with pt - pain 0/50 and function 1/50          OT Education - 11/23/18 1437    Education Details  changes to HEP -and progress    Person(s) Educated  Patient    Methods  Explanation;Demonstration;Tactile cues;Verbal cues;Handout    Comprehension  Verbalized understanding;Returned demonstration       OT Short Term Goals - 11/23/18 1440      OT SHORT TERM GOAL #1   Title  Pt to be independent in HEP to increase AROM in R  thumb and wrist to WNL , strength to use in ADL's more than 75 %    Status  Achieved      OT SHORT TERM GOAL #2   Title  R thumb and wrist AROM improve to [facility] to use more than 75% in ADL's and IADL's    Status  Achieved        OT Long Term Goals - 11/23/18 1440      OT LONG TERM GOAL #1   Title  Function score on PRWHE improve to less than 10/50    Baseline  Function score 1/50    Status  Achieved      OT LONG TERM GOAL #2   Title  L grip strenght improve with more than 5 lbs to peel, cut with  knife , carry objects more than 5 lbs    Baseline  grip R 24, L 40 lbs at eval and now 40 lbs  bilateral    Status  Achieved            Plan - 11/23/18 1438    Clinical Impression Statement  Pt is 11 wks s/p R thumb CMC arthroplasty with hardware removal - pt report no pain -AROM R hand and wrist WNL -and grip /prehension strenght increased again - but thumb PA and RA strength 4+/5 - pt to return in 2 wks for possible discharge    OT Occupational Profile and History  Problem Focused Assessment - Including review of records relating to presenting problem    Occupational performance deficits (Please refer to evaluation for details):  ADL's;IADL's;Play;Leisure    Body Structure / Function / Physical Skills  ADL;Flexibility;UE functional use;Scar mobility;Strength;IADL;Dexterity;ROM    Rehab Potential  Good    Clinical Decision Making  Limited treatment options, no task modification necessary    Comorbidities Affecting Occupational Performance:  None    Modification or Assistance to Complete Evaluation   No modification of tasks or assist necessary to complete eval    OT Frequency  Biweekly    OT Duration  2 weeks    OT Treatment/Interventions  Self-care/ADL training;Therapeutic exercise;Patient/family education;Splinting;Paraffin;Fluidtherapy;Manual Therapy;Passive range of motion;Scar mobilization    Plan  possible discharge    OT Home Exercise Plan  see pt instruction    Consulted and Agree with Plan of Care  Patient       Patient will benefit from skilled therapeutic intervention in order to improve the following deficits and impairments:   Body Structure / Function / Physical Skills: ADL, Flexibility, UE functional use, Scar mobility, Strength, IADL, Dexterity, ROM       Visit Diagnosis: Stiffness of right hand, not elsewhere classified - Plan: Ot plan of care cert/re-cert  Muscle weakness (generalized) - Plan: Ot plan of care cert/re-cert  Stiffness of right wrist, not elsewhere classified - Plan: Ot plan of care cert/re-cert    Problem List Patient Active Problem  List   Diagnosis Date Noted  . 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 Recurrent UTI (urinary tract infection) 07/23/2017  . Nausea vomiting and diarrhea 01/11/2017  . Hypothyroidism 01/11/2017  . HTN (hypertension) 01/11/2017  . Diabetes ([place]) 01/11/2017  . GERD (gastroesophageal reflux disease) 01/11/2017  . Other pancytopenia ([place]) 11/14/2016  . 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 Status post hysterectomy 06/20/2016  . 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 Vaginal atrophy 01/12/2016  . 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    Rosalyn Gess OTR/L,CLT 11/23/2018, 2:43 PM  [place] [facility] [address], [place], [address] Phone: [phone]   Fax:  [phone]  Name: Arrihanna Alberici MRN: [id number] Date of Birth: 06/19/1940

## 2018-11-23 NOTE — Patient Instructions (Signed)
Cont with putty 2 x day  3 sets of 12 gripping , lat and 3 point grip   2 bands for thumb PA , RA -12 reps  2 x day and increase sets over the next 10 days

## 2018-12-02 ENCOUNTER — Other Ambulatory Visit: Payer: Self-pay

## 2018-12-02 ENCOUNTER — Ambulatory Visit: Payer: Medicare Other | Admitting: Occupational Therapy

## 2018-12-02 DIAGNOSIS — M25631 Stiffness of right wrist, not elsewhere classified: Secondary | ICD-10-CM

## 2018-12-02 DIAGNOSIS — M25641 Stiffness of right hand, not elsewhere classified: Secondary | ICD-10-CM

## 2018-12-02 DIAGNOSIS — M6281 Muscle weakness (generalized): Secondary | ICD-10-CM

## 2018-12-02 NOTE — Patient Instructions (Signed)
Contrast several time  During day  And use soft neoprene splint on for compression

## 2018-12-02 NOTE — Therapy (Signed)
[place] [facility] [address] [place], [address] Phone: [phone]   Fax:  [phone]  Occupational Therapy Treatment  Patient Details  Name: Annette Foster MRN: [id number] Date of Birth: [birth year] Referring Provider (OT): [facility]    Encounter Date: 12/02/2018  OT End of Session - 12/02/18 1400    Visit Number  7    Number of Visits  7    Date for OT Re-Evaluation  12/07/18    OT Start Time  [id number]    OT Stop Time  1422    OT Time Calculation (min)  33 min    Activity Tolerance  Patient tolerated treatment well    Behavior During Therapy  [facility] for tasks assessed/performed       Past Medical History:  Diagnosis Date  . Abdominal pain   . Allergy   . Cancer [facility]) 2017   breast cancer- Left  . Cataract   . CHF (congestive heart failure) ([place])   . Collagenous colitis   . Diabetes mellitus without complication ([place])   . Diarrhea   . Diverticulosis   . Fatty liver disease, nonalcoholic   . Fibrocystic breast   . GERD (gastroesophageal reflux disease)   . Heart murmur   . Hyperlipidemia   . Hypertension   . Hypothyroidism   . IBS (irritable bowel syndrome)   . IBS (irritable bowel syndrome)   . IDA (iron deficiency anemia)   . Personal history of radiation therapy   . PONV (postoperative nausea and vomiting)   . Sleep apnea    C-Pap    Past Surgical History:  Procedure Laterality Date  . ABDOMINAL HYSTERECTOMY     tah bso  . ABDOMINAL SURGERY    . APPENDECTOMY    . BREAST BIOPSY Bilateral 2016   negative  . 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 BREAST BIOPSY Left 05/27/2016   neg  . BREAST BIOPSY Left 11/20/2017   affirm bx x clip path pending  . BREAST LUMPECTOMY Left 10/17/2015  . CARDIAC SURGERY     has replacement valve  . CATARACT EXTRACTION Right   . CHOLECYSTECTOMY    . COLONOSCOPY WITH PROPOFOL N/A 03/11/2016   Procedure: COLONOSCOPY WITH  PROPOFOL;  Surgeon: Manya Silvas, MD;  Location: [facility] ENDOSCOPY;  Service: Endoscopy;  Laterality: N/A;  . ESOPHAGOGASTRODUODENOSCOPY (EGD) WITH PROPOFOL N/A 03/11/2016   Procedure: ESOPHAGOGASTRODUODENOSCOPY (EGD) WITH PROPOFOL;  Surgeon: Manya Silvas, MD;  Location: [facility] ENDOSCOPY;  Service: Endoscopy;  Laterality: N/A;  . FINGER ARTHROSCOPY WITH CARPOMETACARPEL ([place]) ARTHROPLASTY Right 09/03/2018   Procedure: CARPOMETACARPEL [facility]) ARTHROPLASTY RIGHT THUMB;  Surgeon: Hessie Knows, MD;  Location: [facility] ORS;  Service: Orthopedics;  Laterality: Right;  . GANGLION CYST EXCISION Right 09/03/2018   Procedure: REMOVAL GANGLION OF WRIST;  Surgeon: Hessie Knows, MD;  Location: [facility] ORS;  Service: Orthopedics;  Laterality: Right;  . HARDWARE REMOVAL Right 09/03/2018   Procedure: HARDWARE REMOVAL RIGHT THUMB;  Surgeon: Hessie Knows, MD;  Location: [facility] ORS;  Service: Orthopedics;  Laterality: Right;  staple removed  . JOINT REPLACEMENT Left    TKR  . left sinusplasty     . MASTECTOMY, PARTIAL Left 10/17/2015   Procedure: MASTECTOMY PARTIAL REVISION;  Surgeon: Leonie Green, MD;  Location: [facility] ORS;  Service: General;  Laterality: Left;  . PARTIAL MASTECTOMY WITH NEEDLE LOCALIZATION Left 09/29/2015   Procedure: PARTIAL MASTECTOMY WITH NEEDLE LOCALIZATION;  Surgeon: Leonie Green,  MD;  Location: [facility] ORS;  Service: General;  Laterality: Left;  . TOTAL ABDOMINAL HYSTERECTOMY W/ BILATERAL SALPINGOOPHORECTOMY      There were no vitals filed for this visit.  Subjective Assessment - 12/02/18 1357    Subjective   My thumb has been hurting really bad since Sunday night - it is red and swollen , and hot - I cannot remember that  I done anything    Pertinent History  Pt had some hardware removed , CMC arthroplasty , and cyst removed on 09/03/2018 - pt has not been sleeping with splint and taking it off about 50% of the time - use it little bit    Patient Stated Goals  I want to have the function  and strenght back in my R thumb without the pain I had    Currently in Pain?  Yes    Pain Score  8     Pain Location  Wrist   THumb CMC   Pain Orientation  Right    Pain Descriptors / Indicators  Aching;Tender    Pain Type  Acute pain    Pain Onset  1 to 4 weeks ago    Pain Frequency  Constant         Pt arrive with her thumb swollen , red and sore since Sunday - pt report she cannot remember doing anything to cause it  Pt was seen about 1 1/2 wks ago Pt done very well and plan was to discharge her this date   Pt pain 8/10 coming in  AROM in thumb was [facility] - but pain with resistance to PA and RA of thumb  Pain with flexion of thumb  Contrast done 4 cycles -and pain decrease to 2/10  Pt to do at home every 2 hrs and use neoprene splint  And not use thumb for anything resistance  Call me if pain is not better tomorrow  And will follow up in 2 days          contrast done 11 min to decrease pain and edema          OT Education - 12/02/18 1358    Education Details  findings and contrast several time during the day    Person(s) Educated  Patient    Methods  Explanation;Demonstration;Tactile cues;Verbal cues;Handout    Comprehension  Verbalized understanding;Returned demonstration       OT Short Term Goals - 11/23/18 1440      OT SHORT TERM GOAL #1   Title  Pt to be independent in HEP to increase AROM in R  thumb and wrist to WNL , strength to use in ADL's more than 75 %    Status  Achieved      OT SHORT TERM GOAL #2   Title  R thumb and wrist AROM improve to [facility] to use more than 75% in ADL's and IADL's    Status  Achieved        OT Long Term Goals - 11/23/18 1440      OT LONG TERM GOAL #1   Title  Function score on PRWHE improve to less than 10/50    Baseline  Function score 1/50    Status  Achieved      OT LONG TERM GOAL #2   Title  L grip strenght improve with more than 5 lbs to peel, cut with knife , carry objects more than 5 lbs    Baseline   grip R 24, L  40 lbs at eval and now 40 lbs bilateral    Status  Achieved            Plan - 12/02/18 1400    Clinical Impression Statement  Pt is about 12 wks s/p R thumb CMC arthroplasty with hardware removal - pt was seen about 1 1/2 wks ago - pt  report pain, swelling and edema at the thumb started Sunday night - she cannot remember that she done anything , hitting it or pushing , pulling or picking up something -done contrast in session this date and AROM appear WNL but just painful- pain did decreaese in session to 2-3/10 from 8/10 - pt to do contrast and will follow up in 2 days - if not getting better by lunch tomorrow to call me    OT Occupational Profile and History  Problem Focused Assessment - Including review of records relating to presenting problem    Occupational performance deficits (Please refer to evaluation for details):  ADL's;IADL's;Play;Leisure    Body Structure / Function / Physical Skills  ADL;Flexibility;UE functional use;Scar mobility;Strength;IADL;Dexterity;ROM    Rehab Potential  Good    Clinical Decision Making  Limited treatment options, no task modification necessary    Comorbidities Affecting Occupational Performance:  None    Modification or Assistance to Complete Evaluation   No modification of tasks or assist necessary to complete eval    OT Frequency  Biweekly    OT Duration  4 weeks    OT Treatment/Interventions  Self-care/ADL training;Therapeutic exercise;Patient/family education;Splinting;Paraffin;Fluidtherapy;Manual Therapy;Passive range of motion;Scar mobilization    Plan  pt to contact surgeon for check up - do contrast and only pain free AROM    OT Home Exercise Plan  see pt instruction    Consulted and Agree with Plan of Care  Patient       Patient will benefit from skilled therapeutic intervention in order to improve the following deficits and impairments:   Body Structure / Function / Physical Skills: ADL, Flexibility, UE functional use, Scar  mobility, Strength, IADL, Dexterity, ROM       Visit Diagnosis: Stiffness of right hand, not elsewhere classified  Muscle weakness (generalized)  Stiffness of right wrist, not elsewhere classified    Problem List Patient Active Problem List   Diagnosis Date Noted  . 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 Recurrent UTI (urinary tract infection) 07/23/2017  . Nausea vomiting and diarrhea 01/11/2017  . Hypothyroidism 01/11/2017  . HTN (hypertension) 01/11/2017  . Diabetes ([place]) 01/11/2017  . GERD (gastroesophageal reflux disease) 01/11/2017  . Other pancytopenia ([place]) 11/14/2016  . 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 Status post hysterectomy 06/20/2016  . 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 Vaginal atrophy 01/12/2016  . 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    Rosalyn Gess OTR/L,CLT 12/02/2018, 3:07 PM  [place] [facility] [address] [place], [address] Phone: [phone]   Fax:  [phone]  Name: Annette Foster MRN: [id number] Date of Birth: 24-Jul-1940

## 2018-12-04 ENCOUNTER — Ambulatory Visit: Payer: Medicare Other | Admitting: Occupational Therapy

## 2018-12-04 ENCOUNTER — Other Ambulatory Visit: Payer: Self-pay

## 2018-12-04 DIAGNOSIS — R278 Other lack of coordination: Secondary | ICD-10-CM

## 2018-12-04 DIAGNOSIS — M25641 Stiffness of right hand, not elsewhere classified: Secondary | ICD-10-CM

## 2018-12-04 DIAGNOSIS — M79641 Pain in right hand: Secondary | ICD-10-CM

## 2018-12-04 DIAGNOSIS — M6281 Muscle weakness (generalized): Secondary | ICD-10-CM

## 2018-12-04 NOTE — Therapy (Signed)
[place] [facility] [address], [place], [address] Phone: [phone]   Fax:  [phone]  Occupational Therapy Treatment  Patient Details  Name: Annette Foster MRN: [id number] Date of Birth: March 15, 1940 Referring Provider (OT): [facility]    Encounter Date: 12/04/2018  OT End of Session - 12/04/18 0957    Visit Number  8    Number of Visits  12    Date for OT Re-Evaluation  12/31/18    OT Start Time  0948    OT Stop Time  1020    OT Time Calculation (min)  32 min    Activity Tolerance  Patient tolerated treatment well    Behavior During Therapy  [facility] for tasks assessed/performed       Past Medical History:  Diagnosis Date  . Abdominal pain   . Allergy   . Cancer [facility]) 2017   breast cancer- Left  . Cataract   . CHF (congestive heart failure) ([place])   . Collagenous colitis   . Diabetes mellitus without complication ([place])   . Diarrhea   . Diverticulosis   . Fatty liver disease, nonalcoholic   . Fibrocystic breast   . GERD (gastroesophageal reflux disease)   . Heart murmur   . Hyperlipidemia   . Hypertension   . Hypothyroidism   . IBS (irritable bowel syndrome)   . IBS (irritable bowel syndrome)   . IDA (iron deficiency anemia)   . Personal history of radiation therapy   . PONV (postoperative nausea and vomiting)   . Sleep apnea    C-Pap    Past Surgical History:  Procedure Laterality Date  . ABDOMINAL HYSTERECTOMY     tah bso  . ABDOMINAL SURGERY    . APPENDECTOMY    . BREAST BIOPSY Bilateral 2016   negative  . 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 BREAST BIOPSY Left 05/27/2016   neg  . BREAST BIOPSY Left 11/20/2017   affirm bx x clip path pending  . BREAST LUMPECTOMY Left 10/17/2015  . CARDIAC SURGERY     has replacement valve  . CATARACT EXTRACTION Right   . CHOLECYSTECTOMY    . COLONOSCOPY WITH PROPOFOL N/A 03/11/2016   Procedure: COLONOSCOPY WITH  PROPOFOL;  Surgeon: Manya Silvas, MD;  Location: [facility] ENDOSCOPY;  Service: Endoscopy;  Laterality: N/A;  . ESOPHAGOGASTRODUODENOSCOPY (EGD) WITH PROPOFOL N/A 03/11/2016   Procedure: ESOPHAGOGASTRODUODENOSCOPY (EGD) WITH PROPOFOL;  Surgeon: Manya Silvas, MD;  Location: [facility] ENDOSCOPY;  Service: Endoscopy;  Laterality: N/A;  . FINGER ARTHROSCOPY WITH CARPOMETACARPEL ([place]) ARTHROPLASTY Right 09/03/2018   Procedure: CARPOMETACARPEL [facility]) ARTHROPLASTY RIGHT THUMB;  Surgeon: Hessie Knows, MD;  Location: [facility] ORS;  Service: Orthopedics;  Laterality: Right;  . GANGLION CYST EXCISION Right 09/03/2018   Procedure: REMOVAL GANGLION OF WRIST;  Surgeon: Hessie Knows, MD;  Location: [facility] ORS;  Service: Orthopedics;  Laterality: Right;  . HARDWARE REMOVAL Right 09/03/2018   Procedure: HARDWARE REMOVAL RIGHT THUMB;  Surgeon: Hessie Knows, MD;  Location: [facility] ORS;  Service: Orthopedics;  Laterality: Right;  staple removed  . JOINT REPLACEMENT Left    TKR  . left sinusplasty     . MASTECTOMY, PARTIAL Left 10/17/2015   Procedure: MASTECTOMY PARTIAL REVISION;  Surgeon: Leonie Green, MD;  Location: [facility] ORS;  Service: General;  Laterality: Left;  . PARTIAL MASTECTOMY WITH NEEDLE LOCALIZATION Left 09/29/2015   Procedure: PARTIAL MASTECTOMY WITH NEEDLE LOCALIZATION;  Surgeon: Leonie Green,  MD;  Location: [facility] ORS;  Service: General;  Laterality: Left;  . TOTAL ABDOMINAL HYSTERECTOMY W/ BILATERAL SALPINGOOPHORECTOMY      There were no vitals filed for this visit.  Subjective Assessment - 12/04/18 0955    Subjective   Doing better- I thnk I maybe over did the rubber band - some pain over side of index -did the ice /heat - and soft splint - not as swollen and red    Pertinent History  Pt had some hardware removed , CMC arthroplasty , and cyst removed on 09/03/2018 - pt has not been sleeping with splint and taking it off about 50% of the time - use it little bit    Patient Stated Goals  I want to have  the function and strenght back in my R thumb without the pain I had    Currently in Pain?  Yes    Pain Score  5     Pain Location  Wrist    Pain Orientation  Right    Pain Descriptors / Indicators  Tender    Pain Type  Acute pain    Pain Onset  1 to 4 weeks ago    Pain Frequency  Constant            Pt arrive with her thumb less swollen , red sore Still tender but not as last time - decrease to 5/10 without pain meds  She had increase pain since past  Sunday - pt report she cannot remember doing anything to cause it  Pt was seen about 2 wks ago prior to this set back Pt done very well and plan was to discharge her this week  Pt pain 8/10 2 days ago with pain meds - but this date 5/10 and no pain meds  AROM in thumb was [facility] - but pain with resistance to PA and RA of thumb - pain decrease since 2 days ago  Less Pain with flexion of thumb  Contrast done 4 cycles -and pain decrease to 0/10  Pt to do at home every 2 hrs and use neoprene splint  And not use thumb for anything resistance                   OT Treatments/Exercises (OP) - 12/04/18 0001      Ultrasound   Ultrasound Location  CMC of R thumb, radial of of 2nd MC      Ultrasound Parameters  3.3MHZ 20%, 0.8 intensity     Ultrasound Goals  Pain      RUE Contrast [place]   Time  9 minutes    Comments  at [facility] to decrease paiin and edema              OT Education - 12/04/18 1259    Education Details  findings and contrast several time during the day    Person(s) Educated  Patient    Methods  Explanation;Demonstration;Tactile cues;Verbal cues;Handout    Comprehension  Verbalized understanding;Returned demonstration       OT Short Term Goals - 12/04/18 1303      OT SHORT TERM GOAL #1   Title  Pt to be independent in HEP to increase AROM in R  thumb and wrist to WNL , strength to use in ADL's more than 75 %    Status  Achieved      OT SHORT TERM GOAL #2   Title  R thumb and wrist AROM improve  to [facility] to use  more than 75% in ADL's and IADL's    Status  Achieved        OT Long Term Goals - 12/04/18 1304      OT LONG TERM GOAL #1   Title  Function score on PRWHE improve to less than 10/50    Baseline  Function score 1/50    Status  Achieved      OT LONG TERM GOAL #2   Title  L grip strenght improve with more than 5 lbs to peel, cut with knife , carry objects more than 5 lbs    Status  Achieved      OT LONG TERM GOAL #3   Title  Pt to decrease her pain from 8/10 at rest that she had the last week to return to prior level of function    Baseline  pain still 5/10 but less redness and edema - pain cont with thumb use and resistance    Time  4    Period  Weeks    Status  New    Target Date  12/31/18            Plan - 12/04/18 0959    Clinical Impression Statement  Pt is 12 1/2 wks s/p R thumb CMC arthroplasty with hardware removal - pt made great progress and was ready to be discharge - and then return earlier this week with increase pain , swelling in R CMC -pain decrease this week doing contrast at home , splint wearing - pt will cont to hold off on any resistance and use of thumb - cont HEP to decrease pain and follow up next week with me    OT Occupational Profile and History  Problem Focused Assessment - Including review of records relating to presenting problem    Occupational performance deficits (Please refer to evaluation for details):  ADL's;IADL's;Play;Leisure    Body Structure / Function / Physical Skills  ADL;Flexibility;UE functional use;Scar mobility;Strength;IADL;Dexterity;ROM    Rehab Potential  Good    Clinical Decision Making  Limited treatment options, no task modification necessary    Comorbidities Affecting Occupational Performance:  None    Modification or Assistance to Complete Evaluation   No modification of tasks or assist necessary to complete eval    OT Frequency  1x / week    OT Duration  4 weeks    OT Treatment/Interventions  Self-care/ADL  training;Therapeutic exercise;Patient/family education;Splinting;Paraffin;Fluidtherapy;Manual Therapy;Passive range of motion;Scar mobilization    Plan  assess if pain decrease    OT Home Exercise Plan  see pt instruction    Consulted and Agree with Plan of Care  Patient       Patient will benefit from skilled therapeutic intervention in order to improve the following deficits and impairments:   Body Structure / Function / Physical Skills: ADL, Flexibility, UE functional use, Scar mobility, Strength, IADL, Dexterity, ROM       Visit Diagnosis: Stiffness of right hand, not elsewhere classified - Plan: Ot plan of care cert/re-cert  Muscle weakness (generalized) - Plan: Ot plan of care cert/re-cert  Other lack of coordination - Plan: Ot plan of care cert/re-cert  Pain in right hand - Plan: Ot plan of care cert/re-cert    Problem List Patient Active Problem List   Diagnosis Date Noted  . 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 Recurrent UTI (urinary tract infection) 07/23/2017  . Nausea vomiting and diarrhea 01/11/2017  . Hypothyroidism 01/11/2017  . HTN (hypertension) 01/11/2017  . Diabetes (  [place]) 01/11/2017  . GERD (gastroesophageal reflux disease) 01/11/2017  . Other pancytopenia ([place]) 11/14/2016  . 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 Status post hysterectomy 06/20/2016  . 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 Vaginal atrophy 01/12/2016  . 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    Rosalyn Gess OTR/L,CLT 12/04/2018, 1:07 PM  [place] [facility] [address] [place], [address] Phone: [phone]   Fax:  [phone]  Name: Anabia Dovalina MRN: [id number] Date of Birth: [birth year]

## 2018-12-04 NOTE — Patient Instructions (Signed)
Contrast - pain free use and AROM  CMC neoprene splint use for compression -and pain

## 2018-12-08 ENCOUNTER — Ambulatory Visit: Payer: Medicare Other | Admitting: Occupational Therapy

## 2018-12-08 ENCOUNTER — Other Ambulatory Visit: Payer: Self-pay

## 2018-12-08 DIAGNOSIS — R278 Other lack of coordination: Secondary | ICD-10-CM

## 2018-12-08 DIAGNOSIS — M79641 Pain in right hand: Secondary | ICD-10-CM

## 2018-12-08 DIAGNOSIS — M25641 Stiffness of right hand, not elsewhere classified: Secondary | ICD-10-CM

## 2018-12-08 DIAGNOSIS — M6281 Muscle weakness (generalized): Secondary | ICD-10-CM

## 2018-12-08 DIAGNOSIS — M25631 Stiffness of right wrist, not elsewhere classified: Secondary | ICD-10-CM

## 2018-12-08 NOTE — Therapy (Signed)
[place] [facility] [address] [place], [address] Phone: [phone]   Fax:  [phone]  Occupational Therapy Treatment  Patient Details  Name: Annette Foster MRN: [id number] Date of Birth: 05/01/1940 Referring Provider (OT): [facility]    Encounter Date: 12/08/2018  OT End of Session - 12/08/18 0929    Visit Number  9    Number of Visits  12    Date for OT Re-Evaluation  12/31/18    OT Start Time  0917    OT Stop Time  0953    OT Time Calculation (min)  36 min    Activity Tolerance  Patient tolerated treatment well    Behavior During Therapy  [facility] for tasks assessed/performed       Past Medical History:  Diagnosis Date  . Abdominal pain   . Allergy   . Cancer [facility]) 2017   breast cancer- Left  . Cataract   . CHF (congestive heart failure) ([place])   . Collagenous colitis   . Diabetes mellitus without complication ([place])   . Diarrhea   . Diverticulosis   . Fatty liver disease, nonalcoholic   . Fibrocystic breast   . GERD (gastroesophageal reflux disease)   . Heart murmur   . Hyperlipidemia   . Hypertension   . Hypothyroidism   . IBS (irritable bowel syndrome)   . IBS (irritable bowel syndrome)   . IDA (iron deficiency anemia)   . Personal history of radiation therapy   . PONV (postoperative nausea and vomiting)   . Sleep apnea    C-Pap    Past Surgical History:  Procedure Laterality Date  . ABDOMINAL HYSTERECTOMY     tah bso  . ABDOMINAL SURGERY    . APPENDECTOMY    . BREAST BIOPSY Bilateral 2016   negative  . 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 BREAST BIOPSY Left 05/27/2016   neg  . BREAST BIOPSY Left 11/20/2017   affirm bx x clip path pending  . BREAST LUMPECTOMY Left 10/17/2015  . CARDIAC SURGERY     has replacement valve  . CATARACT EXTRACTION Right   . CHOLECYSTECTOMY    . COLONOSCOPY WITH PROPOFOL N/A 03/11/2016   Procedure: COLONOSCOPY WITH  PROPOFOL;  Surgeon: Manya Silvas, MD;  Location[facility] ENDOSCOPY;  Service: Endoscopy;  Laterality: N/A;  . ESOPHAGOGASTRODUODENOSCOPY (EGD) WITH PROPOFOL N/A 03/11/2016   Procedure: ESOPHAGOGASTRODUODENOSCOPY (EGD) WITH PROPOFOL;  Surgeon: Manya Silvas, MD;  Location: [facility] ENDOSCOPY;  Service: Endoscopy;  Laterality: N/A;  . FINGER ARTHROSCOPY WITH CARPOMETACARPEL ([place]) ARTHROPLASTY Right 09/03/2018   Procedure: CARPOMETACARPEL [facility]) ARTHROPLASTY RIGHT THUMB;  Surgeon: Hessie Knows, MD;  Location: [facility] ORS;  Service: Orthopedics;  Laterality: Right;  . GANGLION CYST EXCISION Right 09/03/2018   Procedure: REMOVAL GANGLION OF WRIST;  Surgeon: Hessie Knows, MD;  Location: [facility] ORS;  Service: Orthopedics;  Laterality: Right;  . HARDWARE REMOVAL Right 09/03/2018   Procedure: HARDWARE REMOVAL RIGHT THUMB;  Surgeon: Hessie Knows, MD;  Location: [facility] ORS;  Service: Orthopedics;  Laterality: Right;  staple removed  . JOINT REPLACEMENT Left    TKR  . left sinusplasty     . MASTECTOMY, PARTIAL Left 10/17/2015   Procedure: MASTECTOMY PARTIAL REVISION;  Surgeon: Leonie Green, MD;  Location: [facility] ORS;  Service: General;  Laterality: Left;  . PARTIAL MASTECTOMY WITH NEEDLE LOCALIZATION Left 09/29/2015   Procedure: PARTIAL MASTECTOMY WITH NEEDLE LOCALIZATION;  Surgeon: Leonie Green,  MD;  Location: [facility] ORS;  Service: General;  Laterality: Left;  . TOTAL ABDOMINAL HYSTERECTOMY W/ BILATERAL SALPINGOOPHORECTOMY      There were no vitals filed for this visit.  Subjective Assessment - 12/08/18 0920    Subjective   Doing hot and cold and that helps - did had some pain on Friday and took some pain meds -but still getting better - not as red and painfull    Pertinent History  Pt had some hardware removed , CMC arthroplasty , and cyst removed on 09/03/2018 - pt has not been sleeping with splint and taking it off about 50% of the time - use it little bit    Patient Stated Goals  I want to have the  function and strenght back in my R thumb without the pain I had    Currently in Pain?  Yes    Pain Score  2     Pain Location  Finger (Comment which one)   Thumb and radial side of 2nd MC   Pain Orientation  Right    Pain Descriptors / Indicators  Tender    Pain Type  Acute pain    Pain Onset  1 to 4 weeks ago    Pain Frequency  Occasional            Pt arrive with her thumb less swollen , red  Decrease to 0-4/10  She had increase pain last weekend  - pt report she cannot remember doing anything to cause it   Pt done very well prior to that and plan was to discharge her last week  Pt pain 8/10 2 days ago with pain meds start of  last week - but this date 1/10 and no pain meds  - but still increase at times to 4/10  AROM in thumb was [facility] -soreness on radial side of 2nd MC with lat pinch  No pain with thumb AROM in all planes in clinic this date  Contrast done 3 cycles -and pain  0/10  Pt to do at home  3-4 x during day  and use neoprene splint  And not use thumb for anything resistance         OT Treatments/Exercises (OP) - 12/08/18 0001      Ultrasound   Ultrasound Location  radial side of 2nd MC     Ultrasound Parameters  3.3MHZ 20%, 0.8 intensity     Ultrasound Goals  Pain      RUE Contrast [place]   Time  9 minutes    Comments  at [facility] to decreaes pain andedema prior to ROM and manaul       soft tissue massage to webspace and [facility] spreads        OT Education - 12/08/18 0929    Education Details  HEP    Person(s) Educated  Patient    Methods  Explanation;Demonstration;Tactile cues;Verbal cues;Handout    Comprehension  Verbalized understanding;Returned demonstration       OT Short Term Goals - 12/04/18 1303      OT SHORT TERM GOAL #1   Title  Pt to be independent in HEP to increase AROM in R  thumb and wrist to WNL , strength to use in ADL's more than 75 %    Status  Achieved      OT SHORT TERM GOAL #2   Title  R thumb and wrist AROM improve to [facility]  to use more than 75% in ADL's and IADL's  Status  Achieved        OT Long Term Goals - 12/04/18 1304      OT LONG TERM GOAL #1   Title  Function score on PRWHE improve to less than 10/50    Baseline  Function score 1/50    Status  Achieved      OT LONG TERM GOAL #2   Title  L grip strenght improve with more than 5 lbs to peel, cut with knife , carry objects more than 5 lbs    Status  Achieved      OT LONG TERM GOAL #3   Title  Pt to decrease her pain from 8/10 at rest that she had the last week to return to prior level of function    Baseline  pain still 5/10 but less redness and edema - pain cont with thumb use and resistance    Time  4    Period  Weeks    Status  New    Target Date  12/31/18            Plan - 12/08/18 0930    Clinical Impression Statement  Pt is 13 wks s/p R thumb CMC arthroplasty with hardware removal - pt made great progress and was ready to be discharge last week - and then return earlier last  week with increase pain , swelling in R CMC -pain did  decrease since last week doing contrast at home , splint wearing - pt will cont to hold off on any resistance and use of thumb - cont HEP to decrease pain and follow up next week with me    OT Occupational Profile and History  Problem Focused Assessment - Including review of records relating to presenting problem    Occupational performance deficits (Please refer to evaluation for details):  ADL's;IADL's;Play;Leisure    Body Structure / Function / Physical Skills  ADL;Flexibility;UE functional use;Scar mobility;Strength;IADL;Dexterity;ROM    Rehab Potential  Good    Clinical Decision Making  Limited treatment options, no task modification necessary    Comorbidities Affecting Occupational Performance:  None    Modification or Assistance to Complete Evaluation   No modification of tasks or assist necessary to complete eval    OT Frequency  1x / week    OT Duration  4 weeks    OT Treatment/Interventions   Self-care/ADL training;Therapeutic exercise;Patient/family education;Splinting;Paraffin;Fluidtherapy;Manual Therapy;Passive range of motion;Scar mobilization    Plan  assess if pain decrease    OT Home Exercise Plan  see pt instruction    Consulted and Agree with Plan of Care  Patient       Patient will benefit from skilled therapeutic intervention in order to improve the following deficits and impairments:   Body Structure / Function / Physical Skills: ADL, Flexibility, UE functional use, Scar mobility, Strength, IADL, Dexterity, ROM       Visit Diagnosis: Muscle weakness (generalized)  Stiffness of right hand, not elsewhere classified  Other lack of coordination  Pain in right hand  Stiffness of right wrist, not elsewhere classified    Problem List Patient Active Problem List   Diagnosis Date Noted  . 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 Recurrent UTI (urinary tract infection) 07/23/2017  . Nausea vomiting and diarrhea 01/11/2017  . Hypothyroidism 01/11/2017  . HTN (hypertension) 01/11/2017  . Diabetes ([place]) 01/11/2017  . GERD (gastroesophageal reflux disease) 01/11/2017  . Other pancytopenia ([place]) 11/14/2016  . 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 Spongiotic psoriasiform dermatitis  10/08/2016  . Status post hysterectomy 06/20/2016  . 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 Vaginal atrophy 01/12/2016  . 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    Rosalyn Gess OTR/L,CLT 12/08/2018, 10:01 AM  [place] [facility] [address] [place], [address] Phone: [phone]   Fax:  [phone]  Name: Endiya Gendreau MRN: [id number] Date of Birth: 1940-04-14

## 2018-12-08 NOTE — Patient Instructions (Signed)
Contrast and AROM pain free motion - avoid tight pinches or grip  Keep pain under 0-2/10

## 2018-12-14 ENCOUNTER — Ambulatory Visit: Payer: Medicare Other | Attending: Orthopedic Surgery | Admitting: Occupational Therapy

## 2018-12-14 ENCOUNTER — Other Ambulatory Visit: Payer: Self-pay

## 2018-12-14 DIAGNOSIS — M25631 Stiffness of right wrist, not elsewhere classified: Secondary | ICD-10-CM | POA: Diagnosis present

## 2018-12-14 DIAGNOSIS — R278 Other lack of coordination: Secondary | ICD-10-CM | POA: Diagnosis present

## 2018-12-14 DIAGNOSIS — M79641 Pain in right hand: Secondary | ICD-10-CM

## 2018-12-14 DIAGNOSIS — M6281 Muscle weakness (generalized): Secondary | ICD-10-CM | POA: Diagnosis not present

## 2018-12-14 DIAGNOSIS — M25641 Stiffness of right hand, not elsewhere classified: Secondary | ICD-10-CM | POA: Diagnosis present

## 2018-12-14 NOTE — Therapy (Signed)
[place] [facility] [address] [place], [address] Phone: [phone]   Fax:  [phone]  Occupational Therapy Treatment  Patient Details  Name: Annette Foster MRN: [id number] Date of Birth: October 23, 1940 Referring Provider (OT): [facility]    Encounter Date: 12/14/2018  OT End of Session - 12/14/18 0931    Visit Number  10    Number of Visits  12    Date for OT Re-Evaluation  12/31/18    OT Start Time  0917    OT Stop Time  0945    OT Time Calculation (min)  28 min    Activity Tolerance  Patient tolerated treatment well    Behavior During Therapy  [facility] for tasks assessed/performed       Past Medical History:  Diagnosis Date  . Abdominal pain   . Allergy   . Cancer [facility]) 2017   breast cancer- Left  . Cataract   . CHF (congestive heart failure) ([place])   . Collagenous colitis   . Diabetes mellitus without complication ([place])   . Diarrhea   . Diverticulosis   . Fatty liver disease, nonalcoholic   . Fibrocystic breast   . GERD (gastroesophageal reflux disease)   . Heart murmur   . Hyperlipidemia   . Hypertension   . Hypothyroidism   . IBS (irritable bowel syndrome)   . IBS (irritable bowel syndrome)   . IDA (iron deficiency anemia)   . Personal history of radiation therapy   . PONV (postoperative nausea and vomiting)   . Sleep apnea    C-Pap    Past Surgical History:  Procedure Laterality Date  . ABDOMINAL HYSTERECTOMY     [place] bso  . ABDOMINAL SURGERY    . APPENDECTOMY    . BREAST BIOPSY Bilateral 2016   negative  . 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 BREAST BIOPSY Left 05/27/2016   neg  . BREAST BIOPSY Left 11/20/2017   affirm bx x clip path pending  . BREAST LUMPECTOMY Left 10/17/2015  . CARDIAC SURGERY     has replacement valve  . CATARACT EXTRACTION Right   . CHOLECYSTECTOMY    . COLONOSCOPY WITH PROPOFOL N/A 03/11/2016   Procedure: COLONOSCOPY WITH  PROPOFOL;  Surgeon: Manya Silvas, MD;  Location: [facility] ENDOSCOPY;  Service: Endoscopy;  Laterality: N/A;  . ESOPHAGOGASTRODUODENOSCOPY (EGD) WITH PROPOFOL N/A 03/11/2016   Procedure: ESOPHAGOGASTRODUODENOSCOPY (EGD) WITH PROPOFOL;  Surgeon: Manya Silvas, MD;  Location: [facility] ENDOSCOPY;  Service: Endoscopy;  Laterality: N/A;  . FINGER ARTHROSCOPY WITH CARPOMETACARPEL ([place]) ARTHROPLASTY Right 09/03/2018   Procedure: CARPOMETACARPEL [facility]) ARTHROPLASTY RIGHT THUMB;  Surgeon: Hessie Knows, MD;  Location: [facility] ORS;  Service: Orthopedics;  Laterality: Right;  . GANGLION CYST EXCISION Right 09/03/2018   Procedure: REMOVAL GANGLION OF WRIST;  Surgeon: Hessie Knows, MD;  Location: [facility] ORS;  Service: Orthopedics;  Laterality: Right;  . HARDWARE REMOVAL Right 09/03/2018   Procedure: HARDWARE REMOVAL RIGHT THUMB;  Surgeon: Hessie Knows, MD;  Location: [facility] ORS;  Service: Orthopedics;  Laterality: Right;  staple removed  . JOINT REPLACEMENT Left    TKR  . left sinusplasty     . MASTECTOMY, PARTIAL Left 10/17/2015   Procedure: MASTECTOMY PARTIAL REVISION;  Surgeon: Leonie Green, MD;  Location: [facility] ORS;  Service: General;  Laterality: Left;  . PARTIAL MASTECTOMY WITH NEEDLE LOCALIZATION Left 09/29/2015   Procedure: PARTIAL MASTECTOMY WITH NEEDLE LOCALIZATION;  Surgeon: Leonie Green,  MD;  Location: [facility] ORS;  Service: General;  Laterality: Left;  . TOTAL ABDOMINAL HYSTERECTOMY W/ BILATERAL SALPINGOOPHORECTOMY      There were no vitals filed for this visit.  Subjective Assessment - 12/14/18 0929    Subjective   It is so much better - no pain really - this weekend it was at the worse about 1-2/10 - using it but not doing anything forceful    Pertinent History  Pt had some hardware removed , CMC arthroplasty , and cyst removed on 09/03/2018 - pt has not been sleeping with splint and taking it off about 50% of the time - use it little bit    Patient Stated Goals  I want to have the function and  strenght back in my R thumb without the pain I had    Currently in Pain?  No/denies         [facility] OT Assessment - 12/14/18 0001      Strength   Right Hand Grip (lbs)  30    Right Hand Lateral Pinch  8 lbs    Right Hand 3 Point Pinch  7 lbs    Left Hand Grip (lbs)  30    Left Hand Lateral Pinch  11 lbs    Left Hand 3 Point Pinch  9 lbs        Grip strength and prehension - no pain - grip and 3 point decrease  Pt to wean out of CMC neoprene splint again and do functional strengthening   Assess AROM for thumb -WFL in all planes  And no pain with resistance to thumb PA , RA and flexion  Wrist AROM WNL and no pain with resistance - 5/5         OT Treatments/Exercises (OP) - 12/14/18 0001      LUE Paraffin   Number Minutes Paraffin  8 Minutes    LUE Paraffin Location  Hand    Comments  prior to soft tissue mobs        soft tissue mobs to webspace , MC spreads -and joint mobs to [facility]  Carpal spreads   no pain       OT Education - 12/14/18 0931    Education Details  HEP    Person(s) Educated  Patient    Methods  Explanation;Demonstration;Tactile cues;Verbal cues;Handout    Comprehension  Verbalized understanding;Returned demonstration       OT Short Term Goals - 12/04/18 1303      OT SHORT TERM GOAL #1   Title  Pt to be independent in HEP to increase AROM in R  thumb and wrist to WNL , strength to use in ADL's more than 75 %    Status  Achieved      OT SHORT TERM GOAL #2   Title  R thumb and wrist AROM improve to [facility] to use more than 75% in ADL's and IADL's    Status  Achieved        OT Long Term Goals - 12/04/18 1304      OT LONG TERM GOAL #1   Title  Function score on PRWHE improve to less than 10/50    Baseline  Function score 1/50    Status  Achieved      OT LONG TERM GOAL #2   Title  L grip strenght improve with more than 5 lbs to peel, cut with knife , carry objects more than 5 lbs    Status  Achieved  OT LONG TERM GOAL #3   Title   Pt to decrease her pain from 8/10 at rest that she had the last week to return to prior level of function    Baseline  pain still 5/10 but less redness and edema - pain cont with thumb use and resistance    Time  4    Period  Weeks    Status  New    Target Date  12/31/18            Plan - 12/14/18 0932    Clinical Impression Statement  Pt is 14 wks s/p R thumb CMC arthroplasty - pt had increase pain about 2 wks ago - when she was about to be discharge - but doing very well this date with pain at the most this past few days 1-3/10 - grip and 3 point still decrease - pt to gradually increase functional strength and weaning out of [facility] neoprene splint again - will check on her next week again    OT Occupational Profile and History  Problem Focused Assessment - Including review of records relating to presenting problem    Occupational performance deficits (Please refer to evaluation for details):  ADL's;IADL's;Play;Leisure    Body Structure / Function / Physical Skills  ADL;Flexibility;UE functional use;Scar mobility;Strength;IADL;Dexterity;ROM    Rehab Potential  Good    Clinical Decision Making  Limited treatment options, no task modification necessary    Comorbidities Affecting Occupational Performance:  None    Modification or Assistance to Complete Evaluation   No modification of tasks or assist necessary to complete eval    OT Frequency  1x / week    OT Duration  2 weeks    OT Treatment/Interventions  Self-care/ADL training;Therapeutic exercise;Patient/family education;Splinting;Paraffin;Fluidtherapy;Manual Therapy;Passive range of motion;Scar mobilization    Plan  assess progress with increase functional strenghtening    OT Home Exercise Plan  see pt instruction    Consulted and Agree with Plan of Care  Patient       Patient will benefit from skilled therapeutic intervention in order to improve the following deficits and impairments:   Body Structure / Function / Physical Skills:  ADL, Flexibility, UE functional use, Scar mobility, Strength, IADL, Dexterity, ROM       Visit Diagnosis: Muscle weakness (generalized)  Stiffness of right hand, not elsewhere classified  Other lack of coordination  Pain in right hand    Problem List Patient Active Problem List   Diagnosis Date Noted  . 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 Recurrent UTI (urinary tract infection) 07/23/2017  . Nausea vomiting and diarrhea 01/11/2017  . Hypothyroidism 01/11/2017  . HTN (hypertension) 01/11/2017  . Diabetes ([place]) 01/11/2017  . GERD (gastroesophageal reflux disease) 01/11/2017  . Other pancytopenia ([place]) 11/14/2016  . 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 Status post hysterectomy 06/20/2016  . 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 Vaginal atrophy 01/12/2016  . 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    Rosalyn Gess OTR/L,CLT 12/14/2018, 9:55 AM  [place] [facility] [address], [place], [address] Phone: [phone]   Fax:  [phone]  Name: Annette Foster MRN: [id number] Date of Birth: [birth year]

## 2018-12-14 NOTE — Patient Instructions (Signed)
Increase functional activities - and use neoprene CMC black splint with activities that cause some pain or resistance

## 2018-12-23 ENCOUNTER — Ambulatory Visit: Payer: Medicare Other | Admitting: Occupational Therapy

## 2018-12-23 ENCOUNTER — Other Ambulatory Visit: Payer: Self-pay

## 2018-12-23 DIAGNOSIS — M25641 Stiffness of right hand, not elsewhere classified: Secondary | ICD-10-CM

## 2018-12-23 DIAGNOSIS — R278 Other lack of coordination: Secondary | ICD-10-CM

## 2018-12-23 DIAGNOSIS — M79641 Pain in right hand: Secondary | ICD-10-CM

## 2018-12-23 DIAGNOSIS — M6281 Muscle weakness (generalized): Secondary | ICD-10-CM

## 2018-12-23 DIAGNOSIS — M25631 Stiffness of right wrist, not elsewhere classified: Secondary | ICD-10-CM

## 2018-12-23 NOTE — Therapy (Signed)
[facility] [address], [place], [address] Phone: [phone]   Fax:  [phone]  Occupational Therapy Treatment  Patient Details  Name: Annette Foster MRN: [id number] Date of Birth: 1940-02-26 Referring Provider (OT): [facility]    Encounter Date: 12/23/2018  OT End of Session - 12/23/18 1654    Visit Number  11    Number of Visits  12    Date for OT Re-Evaluation  12/31/18    OT Start Time  1412    OT Stop Time  1435    OT Time Calculation (min)  23 min    Activity Tolerance  Patient tolerated treatment well    Behavior During Therapy  [facility] for tasks assessed/performed       Past Medical History:  Diagnosis Date  . Abdominal pain   . Allergy   . Cancer [facility]) 2017   breast cancer- Left  . Cataract   . CHF (congestive heart failure) ([place])   . Collagenous colitis   . Diabetes mellitus without complication ([place])   . Diarrhea   . Diverticulosis   . Fatty liver disease, nonalcoholic   . Fibrocystic breast   . GERD (gastroesophageal reflux disease)   . Heart murmur   . Hyperlipidemia   . Hypertension   . Hypothyroidism   . IBS (irritable bowel syndrome)   . IBS (irritable bowel syndrome)   . IDA (iron deficiency anemia)   . Personal history of radiation therapy   . PONV (postoperative nausea and vomiting)   . Sleep apnea    C-Pap    Past Surgical History:  Procedure Laterality Date  . ABDOMINAL HYSTERECTOMY     [place] b[facility]  . ABDOMINAL SURGERY    . APPENDECTOMY    . BREAST BIOPSY Bilateral 2016   negative  . 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 BREAST BIOPSY Left 05/27/2016   neg  . BREAST BIOPSY Left 11/20/2017   affirm bx x clip path pending  . BREAST LUMPECTOMY Left 10/17/2015  . CARDIAC SURGERY     has replacement valve  . CATARACT EXTRACTION Right   . CHOLECYSTECTOMY    . COLONOSCOPY WITH PROPOFOL N/A 03/11/2016   Procedure: COLONOSCOPY WITH  PROPOFOL;  Surgeon: Manya Silvas, MD;  Location[facility] ENDOSCOPY;  Service: Endoscopy;  Laterality: N/A;  . ESOPHAGOGASTRODUODENOSCOPY (EGD) WITH PROPOFOL N/A 03/11/2016   Procedure: ESOPHAGOGASTRODUODENOSCOPY (EGD) WITH PROPOFOL;  Surgeon: Manya Silvas, MD;  Location: [facility] ENDOSCOPY;  Service: Endoscopy;  Laterality: N/A;  . FINGER ARTHROSCOPY WITH CARPOMETACARPEL ([place]) ARTHROPLASTY Right 09/03/2018   Procedure: CARPOMETACARPEL [facility]) ARTHROPLASTY RIGHT THUMB;  Surgeon: Hessie Knows, MD;  Location: [facility] ORS;  Service: Orthopedics;  Laterality: Right;  . GANGLION CYST EXCISION Right 09/03/2018   Procedure: REMOVAL GANGLION OF WRIST;  Surgeon: Hessie Knows, MD;  Location: [facility] ORS;  Service: Orthopedics;  Laterality: Right;  . HARDWARE REMOVAL Right 09/03/2018   Procedure: HARDWARE REMOVAL RIGHT THUMB;  Surgeon: Hessie Knows, MD;  Location: [facility] ORS;  Service: Orthopedics;  Laterality: Right;  staple removed  . JOINT REPLACEMENT Left    TKR  . left sinusplasty     . MASTECTOMY, PARTIAL Left 10/17/2015   Procedure: MASTECTOMY PARTIAL REVISION;  Surgeon: Leonie Green, MD;  Location: [facility] ORS;  Service: General;  Laterality: Left;  . PARTIAL MASTECTOMY WITH NEEDLE LOCALIZATION Left 09/29/2015   Procedure: PARTIAL MASTECTOMY WITH NEEDLE LOCALIZATION;  Surgeon: Leonie Green,  MD;  Location: [facility] ORS;  Service: General;  Laterality: Left;  . TOTAL ABDOMINAL HYSTERECTOMY W/ BILATERAL SALPINGOOPHORECTOMY      There were no vitals filed for this visit.  Subjective Assessment - 12/23/18 1653    Subjective   I had not pain since last week and using my hand about 70%  - can do most everything - except do not pick up anything heavy yet    Pertinent History  Pt had some hardware removed , CMC arthroplasty , and cyst removed on 09/03/2018 - pt has not been sleeping with splint and taking it off about 50% of the time - use it little bit    Patient Stated Goals  I want to have the function and  strenght back in my R thumb without the pain I had    Currently in Pain?  No/denies         [facility] OT Assessment - 12/23/18 0001      Strength   Right Hand Grip (lbs)  35    Right Hand Lateral Pinch  10 lbs    Right Hand 3 Point Pinch  10 lbs    Left Hand Grip (lbs)  34    Left Hand Lateral Pinch  10 lbs    Left Hand 3 Point Pinch  8 lbs        R thumb and wrist AROM WNL  Grip strength and prehension - with no pain - see flowsheet  Pt to wear CMC neoprene splint only if issues with doing task or heavy task cont with functional strengthening   Assess AROM for thumb -WFL in all planes  And no pain with resistance to thumb PA , RA and flexion  Wrist AROM WNL and no pain with resistance - 5/5                OT Education - 12/23/18 1654    Education Details  discharge instruction    Person(s) Educated  Patient    Methods  Explanation;Demonstration;Tactile cues;Verbal cues;Handout    Comprehension  Verbalized understanding;Returned demonstration       OT Short Term Goals - 12/23/18 1702      OT SHORT TERM GOAL #1   Title  Pt to be independent in HEP to increase AROM in R  thumb and wrist to WNL , strength to use in ADL's more than 75 %    Status  Achieved      OT SHORT TERM GOAL #2   Title  R thumb and wrist AROM improve to [facility] to use more than 75% in ADL's and IADL's    Status  Achieved        OT Long Term Goals - 12/23/18 1702      OT LONG TERM GOAL #1   Title  Function score on PRWHE improve to less than 10/50    Status  Achieved      OT LONG TERM GOAL #2   Title  L grip strenght improve with more than 5 lbs to peel, cut with knife , carry objects more than 5 lbs    Status  Achieved      OT LONG TERM GOAL #3   Title  Pt to decrease her pain from 8/10 at rest that she had the last week to return to prior level of function    Baseline  no pain for last week    Status  Achieved  Plan - 12/23/18 1658    Clinical Impression  Statement  Pt is 15 wks s/p R thumb CMC arthroplasty - pt show AROM  in  R thumb and wrist WNL - strenght WFL -and increase grip and prehension - no pain the last week and increase functional use in ADL's and IADL's - pt met all goals, pt to contact me if any issues in the next 3-4 wks, otherwise will discharge her    OT Occupational Profile and History  Problem Focused Assessment - Including review of records relating to presenting problem    Occupational performance deficits (Please refer to evaluation for details):  ADL's;IADL's;Play;Leisure    Body Structure / Function / Physical Skills  ADL;Flexibility;UE functional use;Scar mobility;Strength;IADL;Dexterity;ROM    Rehab Potential  Good    Clinical Decision Making  Limited treatment options, no task modification necessary    Comorbidities Affecting Occupational Performance:  None    Modification or Assistance to Complete Evaluation   No modification of tasks or assist necessary to complete eval    OT Frequency  1x / week    OT Duration  --   1 week   OT Treatment/Interventions  Self-care/ADL training;Therapeutic exercise;Patient/family education;Splinting;Paraffin;Fluidtherapy;Manual Therapy;Passive range of motion;Scar mobilization    Plan  Pt to cont with functional strength - contact me in next 3-4 wks is any issues - otherwise discharge    OT Home Exercise Plan  see pt instruction    Consulted and Agree with Plan of Care  Patient       Patient will benefit from skilled therapeutic intervention in order to improve the following deficits and impairments:   Body Structure / Function / Physical Skills: ADL, Flexibility, UE functional use, Scar mobility, Strength, IADL, Dexterity, ROM       Visit Diagnosis: Muscle weakness (generalized)  Stiffness of right hand, not elsewhere classified  Other lack of coordination  Pain in right hand  Stiffness of right wrist, not elsewhere classified    Problem List Patient Active Problem  List   Diagnosis Date Noted  . 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 Recurrent UTI (urinary tract infection) 07/23/2017  . Nausea vomiting and diarrhea 01/11/2017  . Hypothyroidism 01/11/2017  . HTN (hypertension) 01/11/2017  . Diabetes ([place]) 01/11/2017  . GERD (gastroesophageal reflux disease) 01/11/2017  . Other pancytopenia ([place]) 11/14/2016  . 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 Status post hysterectomy 06/20/2016  . 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 Vaginal atrophy 01/12/2016  . 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    Rosalyn Gess OTR/L,CLT 12/23/2018, 5:03 PM  [facility] [address] [place], [address] Phone: [phone]   Fax:  [phone]  Name: Annette Foster MRN: [id number] Date of Birth: 11/30/1940

## 2018-12-23 NOTE — Patient Instructions (Signed)
Pt to cont with functional strength

## 2018-12-24 ENCOUNTER — Ambulatory Visit
Admission: RE | Admit: 2018-12-24 | Discharge: 2018-12-24 | Disposition: A | Discharge status: Home / Self Care | Payer: Medicare Other | Source: Ambulatory Visit | Attending: Radiation Oncology | Admitting: Radiation Oncology

## 2018-12-24 ENCOUNTER — Other Ambulatory Visit: Payer: Self-pay

## 2018-12-24 ENCOUNTER — Encounter: Payer: Self-pay | Admitting: Radiation Oncology

## 2018-12-24 VITALS — BP 143/81 | HR 61 | Temp 96.9°F | Resp 20 | Wt 164.8 lb

## 2018-12-24 DIAGNOSIS — Z923 Personal history of irradiation: Secondary | ICD-10-CM | POA: Insufficient documentation

## 2018-12-24 DIAGNOSIS — D0512 Intraductal carcinoma in situ of left breast: Secondary | ICD-10-CM

## 2018-12-24 DIAGNOSIS — Z86000 Personal history of in-situ neoplasm of breast: Secondary | ICD-10-CM | POA: Diagnosis not present

## 2018-12-24 NOTE — Progress Notes (Signed)
Radiation Oncology Follow up Note  Name: Annette Foster   Date:   12/24/2018 MRN:  [id number] DOB: 1940-03-03    This 78 y.o. female presents to the clinic today for 3-year follow-up status post whole breast radiation to her left breast for ER/PR positive ductal carcinoma in situ.  REFERRING PROVIDER: Albina Billet, MD  HPI: Patient is a 78 year old female now out 3 years having completed whole breast radiation to her left breast for ER/PR positive ductal carcinoma in situ.  Seen today in routine follow-up she is doing well.  Unfortunately her husband is having significant medical problems and has Alzheimer's..  She is was on letrozole although discontinue that secondary to hot flashes.  She had an abnormality with calcifications back in 2019 on mammogram that was biopsied and they were benign.  COMPLICATIONS OF TREATMENT: none  FOLLOW UP COMPLIANCE: keeps appointments   PHYSICAL EXAM:  BP (!) 143/81 (Patient Position: Sitting)   Pulse 61   Temp (!) 96.9 F (36.1 C) (Tympanic)   Resp 20   Wt 164 lb 12.8 oz (74.8 kg)   LMP  (LMP Unknown)   BMI 29.19 kg/m  Lungs are clear to A&P cardiac examination essentially unremarkable with regular rate and rhythm. No dominant mass or nodularity is noted in either breast in 2 positions examined. Incision is well-healed. No axillary or supraclavicular adenopathy is appreciated. Cosmetic result is excellent.  Well-developed well-nourished patient in NAD. HEENT reveals PERLA, EOMI, discs not visualized.  Oral cavity is clear. No oral mucosal lesions are identified. Neck is clear without evidence of cervical or supraclavicular adenopathy. Lungs are clear to A&P. Cardiac examination is essentially unremarkable with regular rate and rhythm without murmur rub or thrill. Abdomen is benign with no organomegaly or masses noted. Motor sensory and DTR levels are equal and symmetric in the upper and lower extremities. Cranial nerves II through XII are grossly  intact. Proprioception is intact. No peripheral adenopathy or edema is identified. No motor or sensory levels are noted. Crude visual fields are within normal range.  RADIOLOGY RESULTS: Previous mammograms reviewed and compatible with above-stated findings  PLAN: Present time she continues to do well with no evidence of disease.  She will be scheduled shortly for another follow-up mammogram.  I have asked to see her back in 1 year for follow-up.  Patient knows to call at anytime with any concerns.  She continues follow-up care with medical oncology.  I would like to take this opportunity to thank you for allowing me to participate in the care of your patient.Noreene Filbert, MD

## 2019-01-05 ENCOUNTER — Other Ambulatory Visit: Payer: Self-pay | Admitting: General Surgery

## 2019-01-05 DIAGNOSIS — Z853 Personal history of malignant neoplasm of breast: Secondary | ICD-10-CM

## 2019-01-20 ENCOUNTER — Encounter: Payer: Medicare Other | Admitting: Obstetrics and Gynecology

## 2019-01-20 ENCOUNTER — Ambulatory Visit
Admission: RE | Admit: 2019-01-20 | Discharge: 2019-01-20 | Disposition: A | Discharge status: Home / Self Care | Payer: Medicare Other | Source: Ambulatory Visit | Attending: General Surgery | Admitting: General Surgery

## 2019-01-20 ENCOUNTER — Other Ambulatory Visit: Payer: Self-pay

## 2019-01-20 DIAGNOSIS — Z853 Personal history of malignant neoplasm of breast: Secondary | ICD-10-CM | POA: Diagnosis not present

## 2019-02-25 ENCOUNTER — Ambulatory Visit (INDEPENDENT_AMBULATORY_CARE_PROVIDER_SITE_OTHER): Payer: Medicare PPO | Admitting: Podiatry

## 2019-02-25 ENCOUNTER — Other Ambulatory Visit: Payer: Self-pay

## 2019-02-25 ENCOUNTER — Encounter: Payer: Self-pay | Admitting: Podiatry

## 2019-02-25 DIAGNOSIS — B351 Tinea unguium: Secondary | ICD-10-CM

## 2019-02-25 DIAGNOSIS — M79674 Pain in right toe(s): Secondary | ICD-10-CM

## 2019-02-25 DIAGNOSIS — M79675 Pain in left toe(s): Secondary | ICD-10-CM | POA: Diagnosis not present

## 2019-02-25 DIAGNOSIS — E1142 Type 2 diabetes mellitus with diabetic polyneuropathy: Secondary | ICD-10-CM

## 2019-02-25 NOTE — Progress Notes (Signed)
Complaint:  Visit Type: Patient returns to my office for continued preventative foot care services. Complaint: Patient states" my nails have grown long and thick and become painful to walk and wear shoes" Patient has been diagnosed with DM with no foot complications. The patient presents for preventative foot care services. No changes to ROS  Podiatric Exam: Vascular: dorsalis pedis and posterior tibial pulses are palpable bilateral. Capillary return is immediate. Temperature gradient is WNL. Skin turgor WNL  Sensorium: Normal Semmes Weinstein monofilament test. Normal tactile sensation bilaterally. Nail Exam: Pt has thick disfigured discolored nails with subungual debris noted bilateral entire nail hallux through fifth toenails.    Surgery has healed. Ulcer Exam: There is no evidence of ulcer or pre-ulcerative changes or infection. Orthopedic Exam: Muscle tone and strength are WNL. No limitations in general ROM. No crepitus or effusions noted. Foot type and digits show no abnormalities. Bony prominences are unremarkable. Skin: No Porokeratosis. No infection or ulcers  Diagnosis:  Onychomycosis, , Pain in right toe, pain in left toes  Treatment & Plan Procedures and Treatment: Consent by patient was obtained for treatment procedures. The patient understood the discussion of treatment and procedures well. All questions were answered thoroughly reviewed. Debridement of mycotic and hypertrophic toenails, 1 through 5 bilateral and clearing of subungual debris. No ulceration, no infection noted.   Return Visit-Office Procedure: Patient instructed to return to the office for a follow up visit 4 months for continued evaluation and treatment.    Gardiner Barefoot DPM

## 2019-04-02 ENCOUNTER — Ambulatory Visit: Payer: Medicare Other

## 2019-04-05 ENCOUNTER — Other Ambulatory Visit: Payer: Self-pay

## 2019-04-05 ENCOUNTER — Ambulatory Visit
Admission: RE | Admit: 2019-04-05 | Discharge: 2019-04-05 | Disposition: A | Discharge status: Home / Self Care | Payer: Medicare PPO | Source: Ambulatory Visit | Attending: Internal Medicine | Admitting: Internal Medicine

## 2019-04-05 DIAGNOSIS — D731 Hypersplenism: Secondary | ICD-10-CM | POA: Diagnosis present

## 2019-04-05 DIAGNOSIS — K7460 Unspecified cirrhosis of liver: Secondary | ICD-10-CM | POA: Diagnosis present

## 2019-04-05 DIAGNOSIS — R161 Splenomegaly, not elsewhere classified: Secondary | ICD-10-CM | POA: Diagnosis present

## 2019-04-07 ENCOUNTER — Inpatient Hospital Stay

## 2019-04-07 ENCOUNTER — Inpatient Hospital Stay: Admitting: Internal Medicine

## 2019-04-07 ENCOUNTER — Telehealth: Payer: Self-pay | Admitting: *Deleted

## 2019-04-07 NOTE — Telephone Encounter (Signed)
msg left by scheduling team. Pt no show for apts. Left msg - to request to r/s apts

## 2019-04-07 NOTE — Assessment & Plan Note (Deleted)
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  Mammogram September 2019/biopsy-dystrophic calcifications.  Mammogram this year pending.  Discontinue letrozole because of poor tolerance.   #Vaginal atrophy-on estradiol topical as needed.  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  ANC 1.0.  Platelets 90.  Overall stable.  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  #Diabetes poorly controlled-random blood sugar- 293; poorly controlled.  # DISPOSITION:  # follow up in 6 months-MD-cbc/cmp/AFP; [place] prior-Dr.B  Dr. Hall Busing

## 2019-04-07 NOTE — Progress Notes (Deleted)
[facility] OFFICE PROGRESS NOTE  Patient Care Team: Albina Billet, MD as PCP - General (Internal Medicine)  Cancer Staging No matching staging information was found for the patient.   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 OCT 2018- BMBx- MILD dyspoiesis; variable cellularity [10-50%]; FISH/Cytogenetics-NORMAL; F-One-NGS-declined by insurance.    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 Brandon/uro-gyne,[facility]]  # HRT [clinical trial thru [place]; stopped July 2017 ]; CPAP   DIAGNOSIS: Left breast DCIS  STAGE:    0     ;GOALS: cure  CURRENT/MOST RECENT THERAPY: surveillaince    Ductal carcinoma in situ (DCIS) of left breast    INTERVAL HISTORY:  Annette Foster 79 y.o.  female pleasant patient above history of DCIS; cirrhosis portal hypertension is here for follow-up.  Patient is currently discontinued letrozole given poor tolerance.  Patient is on topical estradiol because of vaginal atrophy.  Patient has not had a recent UTIs.  She is clinically improved.  Patient continues to be a lot of stress given her husband's ill health.  Otherwise denies any recent infections.  Denies any bleeding.  No swelling in the legs.  No nausea vomiting.  No black or stools.  Review of Systems  Constitutional: Positive for malaise/fatigue. Negative for chills, diaphoresis, fever and weight loss.  HENT: Negative for nosebleeds and sore throat.   Eyes: Negative for double vision.  Respiratory: Negative for cough, hemoptysis, sputum production, shortness of breath and wheezing.   Cardiovascular: Negative for chest pain,  palpitations, orthopnea and leg swelling.  Gastrointestinal: Negative for abdominal pain, blood in stool, constipation, diarrhea, heartburn, melena, nausea and vomiting.  Genitourinary: Positive for dysuria, frequency and urgency.  Musculoskeletal: Positive for back pain. Negative for joint pain.  Skin: Negative.  Negative for itching and rash.  Neurological: Negative for dizziness, tingling, focal weakness, weakness and headaches.  Endo/Heme/Allergies: Does not bruise/bleed easily.  Psychiatric/Behavioral: Negative for depression. The patient is not nervous/anxious and does not have insomnia.       PAST MEDICAL HISTORY :  Past Medical History:  Diagnosis Date  . Abdominal pain   . Allergy   . Cancer [facility]) 2017   breast cancer- Left  . Cataract   . CHF (congestive heart failure) ([place])   . Collagenous colitis   . Diabetes mellitus without complication ([place])   . Diarrhea   . Diverticulosis   . Fatty liver disease, nonalcoholic   . Fibrocystic breast   . GERD (gastroesophageal reflux disease)   . Heart murmur   . Hyperlipidemia   . Hypertension   . Hypothyroidism   . IBS (irritable bowel syndrome)   . IBS (irritable bowel syndrome)   . IDA (iron deficiency anemia)   . Personal history of radiation therapy 2017   LEFT BREAST CA  . PONV (postoperative nausea and vomiting)   . Sleep apnea    C-Pap    PAST SURGICAL HISTORY :   Past Surgical History:  Procedure Laterality Date  . ABDOMINAL HYSTERECTOMY     [facility]  . ABDOMINAL SURGERY    . APPENDECTOMY    . BREAST BIOPSY Bilateral 2016   negative  . BREAST 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/2015  DCIS, papillary carcinoma in situ  . BREAST BIOPSY Left 05/27/2016   BENIGN MAMMARY EPITHELIUM  . BREAST BIOPSY Left 11/20/2017   affirm bx x clip BENIGN MAMMARY EPITHELIUM CONSISTENT WITH RAD THERAPY  . BREAST LUMPECTOMY Left 10/17/2015   DCIS and papillary carcinoma insitu, clear margins  . CARDIAC SURGERY     has replacement valve  .  CATARACT EXTRACTION Right   . CHOLECYSTECTOMY    . COLONOSCOPY WITH PROPOFOL N/A 03/11/2016   Procedure: COLONOSCOPY WITH PROPOFOL;  Surgeon: Manya Silvas, MD;  Location: [facility] ENDOSCOPY;  Service: Endoscopy;  Laterality: N/A;  . ESOPHAGOGASTRODUODENOSCOPY (EGD) WITH PROPOFOL N/A 03/11/2016   Procedure: ESOPHAGOGASTRODUODENOSCOPY (EGD) WITH PROPOFOL;  Surgeon: Manya Silvas, MD;  Location: [facility] ENDOSCOPY;  Service: Endoscopy;  Laterality: N/A;  . FINGER ARTHROSCOPY WITH CARPOMETACARPEL ([place]) ARTHROPLASTY Right 09/03/2018   Procedure: CARPOMETACARPEL [facility]) ARTHROPLASTY RIGHT THUMB;  Surgeon: Hessie Knows, MD;  Location: [facility] ORS;  Service: Orthopedics;  Laterality: Right;  . GANGLION CYST EXCISION Right 09/03/2018   Procedure: REMOVAL GANGLION OF WRIST;  Surgeon: Hessie Knows, MD;  Location: [facility] ORS;  Service: Orthopedics;  Laterality: Right;  . HARDWARE REMOVAL Right 09/03/2018   Procedure: HARDWARE REMOVAL RIGHT THUMB;  Surgeon: Hessie Knows, MD;  Location: [facility] ORS;  Service: Orthopedics;  Laterality: Right;  staple removed  . JOINT REPLACEMENT Left    TKR  . left sinusplasty     . MASTECTOMY, PARTIAL Left 10/17/2015   Procedure: MASTECTOMY PARTIAL REVISION;  Surgeon: Leonie Green, MD;  Location: [facility] ORS;  Service: General;  Laterality: Left;  . PARTIAL MASTECTOMY WITH NEEDLE LOCALIZATION Left 09/29/2015   Procedure: PARTIAL MASTECTOMY WITH NEEDLE LOCALIZATION;  Surgeon: Leonie Green, MD;  Location: [facility] ORS;  Service: General;  Laterality: Left;  . TOTAL ABDOMINAL HYSTERECTOMY W/ BILATERAL SALPINGOOPHORECTOMY      FAMILY HISTORY :   Family History  Problem Relation Age of Onset  . Breast cancer Paternal Grandmother   . Colon cancer Father   . Diabetes Sister   . Diabetes Brother   . Heart disease Brother   . Prostate cancer Brother   . Colon cancer Maternal Uncle   . Prostate cancer Brother   . Bladder Cancer Brother   . Leukemia Mother        all  . Ovarian  cancer Neg Hx   . Kidney cancer Neg Hx     SOCIAL HISTORY:   Social History   Tobacco Use  . Smoking status: Never Smoker  . Smokeless tobacco: Never Used  Substance Use Topics  . Alcohol use: No    Alcohol/week: 0.0 standard drinks  . Drug use: No    ALLERGIES:  is allergic to codeine; demeclocycline; demerol [meperidine]; hydrocodone; oxycodone; pentazocine; tetracyclines & related; and fentanyl.  MEDICATIONS:  Current Outpatient Medications  Medication Sig Dispense Refill  . aspirin 81 MG tablet Take 81 mg by mouth at bedtime.    . benzonatate (TESSALON) 200 MG capsule Take 200 mg by mouth 3 (three) times daily as needed for cough.    . budesonide-formoterol (SYMBICORT) 80-4.5 MCG/ACT inhaler Inhale 2 puffs into the lungs 2 (two) times daily as needed (shortness of breath).     . Cholecalciferol (VITAMIN D) 50 MCG (2000 UT) CAPS Take 2,000 Units by mouth daily.    . Cyanocobalamin (B-12 COMPLIANCE INJECTION) 1000 MCG/ML KIT Inject 1,000 mcg as directed every 30 (thirty) days.     Marland Kitchen escitalopram (LEXAPRO) 5 MG tablet Take 5 mg by mouth  at bedtime.    Marland Kitchen esomeprazole (NEXIUM) 40 MG capsule Take 40 mg by mouth 2 (two) times daily before a meal.     . fenofibrate 160 MG tablet Take 160 mg by mouth at bedtime.    . fexofenadine (ALLEGRA) 180 MG tablet Take 180 mg by mouth at bedtime.     . fluticasone (FLONASE) 50 MCG/ACT nasal spray Place 1 spray into the nose daily as needed for allergies.     . furosemide (LASIX) 20 MG tablet Take 20-40 mg by mouth See admin instructions. Take once a day. alternate taking 55m and 4108m    . gabapentin (NEURONTIN) 300 MG capsule Take 300 mg by mouth at bedtime.     . Marland Kitchenlimepiride (AMARYL) 2 MG tablet Take 2 mg by mouth at bedtime.    . Marland KitchenUMALOG KWIKPEN 100 UNIT/ML KwikPen Inject 8 Units into the skin 3 (three) times daily. Before meals    . Hyoscyamine Sulfate 0.375 MG TBCR Take 0.375 mg by mouth 2 (two) times daily.     . Insulin Degludec (TRESIBA  FLEXTOUCH [place]) Inject 44 Units into the skin at bedtime.     . Marland Kitchenevothyroxine (SYNTHROID, LEVOTHROID) 75 MCG tablet Take 75 mcg by mouth daily before breakfast.     . magnesium oxide (MAG-OX) 400 MG tablet Take 400 mg by mouth 2 (two) times daily.     . meloxicam (MOBIC) 15 MG tablet Take 15 mg by mouth daily.    . metFORMIN (GLUCOPHAGE-XR) 750 MG 24 hr tablet Take 750 mg by mouth 2 (two) times daily.    . montelukast (SINGULAIR) 10 MG tablet Take 10 mg by mouth at bedtime.     . Marland Kitchenlopatadine (PATANOL) 0.1 % ophthalmic solution Place 1 drop into both eyes 2 (two) times daily as needed for allergies.     . Marland Kitchenndansetron (ZOFRAN) 4 MG tablet Take 4 mg by mouth every 8 (eight) hours as needed for nausea or vomiting.   1  . potassium chloride SA (K-DUR,KLOR-CON) 20 MEQ tablet Take 20 mEq by mouth 2 (two) times daily.     . Marland Kitchenaccharomyces boulardii (FLORASTOR) 250 MG capsule Take 250 mg by mouth 2 (two) times daily.     . simvastatin (ZOCOR) 20 MG tablet Take 20 mg by mouth at bedtime.    . traMADol (ULTRAM) 50 MG tablet Take 1-2 tablets (50-100 mg total) by mouth every 6 (six) hours as needed. 35 tablet 0  . valACYclovir (VALTREX) 500 MG tablet Take 500 mg by mouth See admin instructions. For fever blisters take 50042mwice a day.  1  . valsartan (DIOVAN) 160 MG tablet Take 160 mg by mouth every morning.      No current facility-administered medications for this visit.    PHYSICAL EXAMINATION: ECOG PERFORMANCE STATUS: 1 - Symptomatic but completely ambulatory  LMP  (LMP Unknown)   There were no vitals filed for this visit.  Physical Exam  Constitutional: She is oriented to person, place, and time and well-developed, well-nourished, and in no distress.  HENT:  Head: Normocephalic and atraumatic.  Mouth/Throat: Oropharynx is clear and moist. No oropharyngeal exudate.  Eyes: Pupils are equal, round, and reactive to light.  Cardiovascular: Normal rate and regular rhythm.  Pulmonary/Chest: No  respiratory distress. She has no wheezes.  Abdominal: Soft. Bowel sounds are normal. She exhibits no distension and no mass. There is no abdominal tenderness. There is no rebound and no guarding.  Musculoskeletal:        General:  No tenderness or edema. Normal range of motion.     Cervical back: Normal range of motion and neck supple.  Neurological: She is alert and oriented to person, place, and time.  Skin: Skin is warm.  Right and left BREAST exam (in the presence of nurse)- no unusual skin changes or dominant masses felt. Surgical scars noted.   Psychiatric: Affect normal.     LABORATORY DATA:  I have reviewed the data as listed    Component Value Date/Time   NA 136 10/02/2018 1326   K 4.1 10/02/2018 1326   CL 103 10/02/2018 1326   CO2 24 10/02/2018 1326   GLUCOSE 291 (H) 10/02/2018 1326   BUN 14 10/02/2018 1326   CREATININE 0.62 10/02/2018 1326   CALCIUM 9.4 10/02/2018 1326   PROT 8.1 10/02/2018 1326   ALBUMIN 3.8 10/02/2018 1326   AST 32 10/02/2018 1326   ALT 20 10/02/2018 1326   ALKPHOS 78 10/02/2018 1326   BILITOT 0.6 10/02/2018 1326   GFRNONAA >60 10/02/2018 1326   GFRAA >60 10/02/2018 1326    No results found for: SPEP, UPEP  Lab Results  Component Value Date   WBC 2.3 (L) 10/02/2018   NEUTROABS 1.0 (L) 10/02/2018   HGB 9.2 (L) 10/02/2018   HCT 29.1 (L) 10/02/2018   MCV 84.6 10/02/2018   PLT 90 (L) 10/02/2018      Chemistry      Component Value Date/Time   NA 136 10/02/2018 1326   K 4.1 10/02/2018 1326   CL 103 10/02/2018 1326   CO2 24 10/02/2018 1326   BUN 14 10/02/2018 1326   CREATININE 0.62 10/02/2018 1326      Component Value Date/Time   CALCIUM 9.4 10/02/2018 1326   ALKPHOS 78 10/02/2018 1326   AST 32 10/02/2018 1326   ALT 20 10/02/2018 1326   BILITOT 0.6 10/02/2018 1326       RADIOGRAPHIC STUDIES: I have personally reviewed the radiological images as listed and agreed with the findings in the report. No results found.   ASSESSMENT  & PLAN:  No problem-specific Assessment & Plan notes found for this encounter.   No orders of the defined types were placed in this encounter.  All questions were answered. The patient knows to call the clinic with any problems, questions or concerns.      Cammie Sickle, MD 04/07/2019 1:47 PM

## 2019-04-12 ENCOUNTER — Other Ambulatory Visit: Payer: Self-pay

## 2019-04-12 ENCOUNTER — Inpatient Hospital Stay (HOSPITAL_BASED_OUTPATIENT_CLINIC_OR_DEPARTMENT_OTHER): Payer: Medicare PPO | Admitting: Internal Medicine

## 2019-04-12 ENCOUNTER — Inpatient Hospital Stay: Payer: Medicare PPO | Attending: Internal Medicine

## 2019-04-12 DIAGNOSIS — Z79899 Other long term (current) drug therapy: Secondary | ICD-10-CM | POA: Insufficient documentation

## 2019-04-12 DIAGNOSIS — D6959 Other secondary thrombocytopenia: Secondary | ICD-10-CM | POA: Diagnosis not present

## 2019-04-12 DIAGNOSIS — M549 Dorsalgia, unspecified: Secondary | ICD-10-CM | POA: Diagnosis not present

## 2019-04-12 DIAGNOSIS — K7460 Unspecified cirrhosis of liver: Secondary | ICD-10-CM

## 2019-04-12 DIAGNOSIS — E039 Hypothyroidism, unspecified: Secondary | ICD-10-CM | POA: Insufficient documentation

## 2019-04-12 DIAGNOSIS — R0789 Other chest pain: Secondary | ICD-10-CM | POA: Diagnosis not present

## 2019-04-12 DIAGNOSIS — Z7951 Long term (current) use of inhaled steroids: Secondary | ICD-10-CM | POA: Diagnosis not present

## 2019-04-12 DIAGNOSIS — Z791 Long term (current) use of non-steroidal anti-inflammatories (NSAID): Secondary | ICD-10-CM | POA: Diagnosis not present

## 2019-04-12 DIAGNOSIS — E785 Hyperlipidemia, unspecified: Secondary | ICD-10-CM | POA: Diagnosis not present

## 2019-04-12 DIAGNOSIS — K219 Gastro-esophageal reflux disease without esophagitis: Secondary | ICD-10-CM | POA: Diagnosis not present

## 2019-04-12 DIAGNOSIS — D0512 Intraductal carcinoma in situ of left breast: Secondary | ICD-10-CM

## 2019-04-12 DIAGNOSIS — R161 Splenomegaly, not elsewhere classified: Secondary | ICD-10-CM

## 2019-04-12 DIAGNOSIS — K7581 Nonalcoholic steatohepatitis (NASH): Secondary | ICD-10-CM | POA: Diagnosis not present

## 2019-04-12 DIAGNOSIS — I509 Heart failure, unspecified: Secondary | ICD-10-CM | POA: Insufficient documentation

## 2019-04-12 DIAGNOSIS — Z794 Long term (current) use of insulin: Secondary | ICD-10-CM | POA: Diagnosis not present

## 2019-04-12 DIAGNOSIS — D61818 Other pancytopenia: Secondary | ICD-10-CM | POA: Insufficient documentation

## 2019-04-12 DIAGNOSIS — E1136 Type 2 diabetes mellitus with diabetic cataract: Secondary | ICD-10-CM | POA: Diagnosis not present

## 2019-04-12 DIAGNOSIS — Z7982 Long term (current) use of aspirin: Secondary | ICD-10-CM | POA: Diagnosis not present

## 2019-04-12 DIAGNOSIS — D731 Hypersplenism: Secondary | ICD-10-CM

## 2019-04-12 DIAGNOSIS — G4730 Sleep apnea, unspecified: Secondary | ICD-10-CM | POA: Diagnosis not present

## 2019-04-12 DIAGNOSIS — I110 Hypertensive heart disease with heart failure: Secondary | ICD-10-CM | POA: Insufficient documentation

## 2019-04-12 DIAGNOSIS — E1165 Type 2 diabetes mellitus with hyperglycemia: Secondary | ICD-10-CM | POA: Diagnosis not present

## 2019-04-12 LAB — COMPREHENSIVE METABOLIC PANEL
ALT: 13 U/L (ref 0–44)
AST: 22 U/L (ref 15–41)
Albumin: 3.8 g/dL (ref 3.5–5.0)
Alkaline Phosphatase: 52 U/L (ref 38–126)
Anion gap: 10 (ref 5–15)
BUN: 16 mg/dL (ref 8–23)
CO2: 27 mmol/L (ref 22–32)
Calcium: 9.3 mg/dL (ref 8.9–10.3)
Chloride: 102 mmol/L (ref 98–111)
Creatinine, Ser: 0.67 mg/dL (ref 0.44–1.00)
GFR calc Af Amer: >60 mL/min (ref >60)
GFR calc non Af Amer: >60 mL/min (ref >60)
Glucose, Bld: 284 mg/dL — ABNORMAL HIGH (ref 70–99)
Potassium: 3.7 mmol/L (ref 3.5–5.1)
Sodium: 139 mmol/L (ref 135–145)
Total Bilirubin: 0.5 mg/dL (ref 0.3–1.2)
Total Protein: 8 g/dL (ref 6.5–8.1)

## 2019-04-12 LAB — CBC WITH DIFFERENTIAL/PLATELET
Abs Immature Granulocytes: 0 10*3/uL (ref 0.00–0.07)
Basophils Absolute: 0 10*3/uL (ref 0.0–0.1)
Basophils Relative: 1 %
Eosinophils Absolute: 0 10*3/uL (ref 0.0–0.5)
Eosinophils Relative: 2 %
HCT: 28.1 % — ABNORMAL LOW (ref 36.0–46.0)
Hemoglobin: 8.1 g/dL — ABNORMAL LOW (ref 12.0–15.0)
Immature Granulocytes: 0 %
Lymphocytes Relative: 36 %
Lymphs Abs: 0.8 10*3/uL (ref 0.7–4.0)
MCH: 24.5 pg — ABNORMAL LOW (ref 26.0–34.0)
MCHC: 28.8 g/dL — ABNORMAL LOW (ref 30.0–36.0)
MCV: 85.2 fL (ref 80.0–100.0)
Monocytes Absolute: 0.2 10*3/uL (ref 0.1–1.0)
Monocytes Relative: 11 %
Neutro Abs: 1.1 10*3/uL — ABNORMAL LOW (ref 1.7–7.7)
Neutrophils Relative %: 50 %
Platelets: 114 10*3/uL — ABNORMAL LOW (ref 150–400)
RBC: 3.3 MIL/uL — ABNORMAL LOW (ref 3.87–5.11)
RDW: 16 % — ABNORMAL HIGH (ref 11.5–15.5)
WBC: 2.1 10*3/uL — ABNORMAL LOW (ref 4.0–10.5)
nRBC: 0 % (ref 0.0–0.2)

## 2019-04-12 NOTE — Assessment & Plan Note (Addendum)
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  Stable.  Mammogram December 2020-within normal limits.  Off AI because of intolerance.   # Anemia-worsening hemoglobin 8.3-unclear etiology/underlying cirrhosis.  Check iron studies ferritin.  Recommend p.o. iron once a day  # right chest wall posteior pain-unclear etiology likely musculoskeletal check x-rays ribs/thoracic spine x-ray.  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  ANC 1.0.  Platelets 90.  Overall stable.  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 screening- [place]- NEG;   #Diabetes poorly controlled-random blood sugar-284; poorly controlled.  # DISPOSITION: ADD iron studies/ferritin-  # X-rays in next 1-2 days.  # follow up in 3 months-MD-cbc/cmp- Possible Venofer- -Dr.B  Dr. Hall Busing

## 2019-04-12 NOTE — Progress Notes (Signed)
[facility] OFFICE PROGRESS NOTE  Patient Care Team: Albina Billet, MD as PCP - General (Internal Medicine)  Cancer Staging No matching staging information was found for the patient.   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 OCT 2018- BMBx- MILD dyspoiesis; variable cellularity [10-50%]; FISH/Cytogenetics-NORMAL; F-One-NGS-declined by insurance.    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 Brandon/uro-gyne,[facility]]  # HRT [clinical trial thru [facility]; stopped July 2017 ]; CPAP   DIAGNOSIS: Left breast DCIS  STAGE:    0     ;GOALS: cure  CURRENT/MOST RECENT THERAPY: surveillaince    Ductal carcinoma in situ (DCIS) of left breast    INTERVAL HISTORY:  Annette Foster 79 y.o.  female pleasant patient above history of DCIS; cirrhosis portal hypertension is here for follow-up.  Patient continues to be a lot of stress because of her husband's ill health.  She complains of fatigue.  Patient complains of back pain right posterior-for the last 2 to 3 months.  Progressive getting worse.  Difficulty sleeping at night.  Patient has tried NSAIDs not help.  Denies any bleeding.  Denies any swelling in the legs.  No nausea no vomiting.   Review of Systems  Constitutional: Positive for malaise/fatigue. Negative for chills, diaphoresis, fever and weight loss.  HENT: Negative for nosebleeds and sore throat.   Eyes: Negative for double vision.  Respiratory: Negative for cough, hemoptysis, sputum production, shortness of breath and wheezing.   Cardiovascular: Negative for chest pain, palpitations, orthopnea and leg  swelling.  Gastrointestinal: Negative for abdominal pain, blood in stool, constipation, diarrhea, heartburn, melena, nausea and vomiting.  Musculoskeletal: Positive for back pain and joint pain.  Skin: Negative.  Negative for itching and rash.  Neurological: Negative for dizziness, tingling, focal weakness, weakness and headaches.  Endo/Heme/Allergies: Does not bruise/bleed easily.  Psychiatric/Behavioral: Negative for depression. The patient is not nervous/anxious and does not have insomnia.       PAST MEDICAL HISTORY :  Past Medical History:  Diagnosis Date  . Abdominal pain   . Allergy   . Cancer [facility]) 2017   breast cancer- Left  . Cataract   . CHF (congestive heart failure) ([place])   . Collagenous colitis   . Diabetes mellitus without complication ([place])   . Diarrhea   . Diverticulosis   . Fatty liver disease, nonalcoholic   . Fibrocystic breast   . GERD (gastroesophageal reflux disease)   . Heart murmur   . Hyperlipidemia   . Hypertension   . Hypothyroidism   . IBS (irritable bowel syndrome)   . IBS (irritable bowel syndrome)   . IDA (iron deficiency anemia)   . Personal history of radiation therapy 2017   LEFT BREAST CA  . PONV (postoperative nausea and vomiting)   . Sleep apnea    C-Pap    PAST SURGICAL HISTORY :   Past Surgical History:  Procedure Laterality Date  . ABDOMINAL HYSTERECTOMY     [place] bso  . ABDOMINAL SURGERY    . APPENDECTOMY    . BREAST BIOPSY Bilateral 2016   negative  . 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 BREAST BIOPSY Left 05/27/2016  BENIGN MAMMARY EPITHELIUM  . BREAST BIOPSY Left 11/20/2017   affirm bx x clip BENIGN MAMMARY EPITHELIUM CONSISTENT WITH RAD THERAPY  . BREAST LUMPECTOMY Left 10/17/2015   DCIS and papillary carcinoma insitu, clear margins  . CARDIAC SURGERY     has replacement valve  . CATARACT EXTRACTION Right   . CHOLECYSTECTOMY    . COLONOSCOPY WITH PROPOFOL N/A 03/11/2016    Procedure: COLONOSCOPY WITH PROPOFOL;  Surgeon: Manya Silvas, MD;  Location: [facility] ENDOSCOPY;  Service: Endoscopy;  Laterality: N/A;  . ESOPHAGOGASTRODUODENOSCOPY (EGD) WITH PROPOFOL N/A 03/11/2016   Procedure: ESOPHAGOGASTRODUODENOSCOPY (EGD) WITH PROPOFOL;  Surgeon: Manya Silvas, MD;  Location: [facility] ENDOSCOPY;  Service: Endoscopy;  Laterality: N/A;  . FINGER ARTHROSCOPY WITH CARPOMETACARPEL ([place]) ARTHROPLASTY Right 09/03/2018   Procedure: CARPOMETACARPEL [facility]) ARTHROPLASTY RIGHT THUMB;  Surgeon: Hessie Knows, MD;  Location: [facility] ORS;  Service: Orthopedics;  Laterality: Right;  . GANGLION CYST EXCISION Right 09/03/2018   Procedure: REMOVAL GANGLION OF WRIST;  Surgeon: Hessie Knows, MD;  Location: [facility] ORS;  Service: Orthopedics;  Laterality: Right;  . HARDWARE REMOVAL Right 09/03/2018   Procedure: HARDWARE REMOVAL RIGHT THUMB;  Surgeon: Hessie Knows, MD;  Location: [facility] ORS;  Service: Orthopedics;  Laterality: Right;  staple removed  . JOINT REPLACEMENT Left    TKR  . left sinusplasty     . MASTECTOMY, PARTIAL Left 10/17/2015   Procedure: MASTECTOMY PARTIAL REVISION;  Surgeon: Leonie Green, MD;  Location: [facility] ORS;  Service: General;  Laterality: Left;  . PARTIAL MASTECTOMY WITH NEEDLE LOCALIZATION Left 09/29/2015   Procedure: PARTIAL MASTECTOMY WITH NEEDLE LOCALIZATION;  Surgeon: Leonie Green, MD;  Location: [facility] ORS;  Service: General;  Laterality: Left;  . TOTAL ABDOMINAL HYSTERECTOMY W/ BILATERAL SALPINGOOPHORECTOMY      FAMILY HISTORY :   Family History  Problem Relation Age of Onset  . Breast cancer Paternal Grandmother   . Colon cancer Father   . Diabetes Sister   . Diabetes Brother   . Heart disease Brother   . Prostate cancer Brother   . Colon cancer Maternal Uncle   . Prostate cancer Brother   . Bladder Cancer Brother   . Leukemia Mother        all  . Ovarian cancer Neg Hx   . Kidney cancer Neg Hx     SOCIAL HISTORY:   Social History   Tobacco Use   . Smoking status: Never Smoker  . Smokeless tobacco: Never Used  Substance Use Topics  . Alcohol use: No    Alcohol/week: 0.0 standard drinks  . Drug use: No    ALLERGIES:  is allergic to codeine; demeclocycline; demerol [meperidine]; hydrocodone; oxycodone; pentazocine; tetracyclines & related; and fentanyl.  MEDICATIONS:  Current Outpatient Medications  Medication Sig Dispense Refill  . aspirin 81 MG tablet Take 81 mg by mouth at bedtime.    . benzonatate (TESSALON) 200 MG capsule Take 200 mg by mouth 3 (three) times daily as needed for cough.    . budesonide-formoterol (SYMBICORT) 80-4.5 MCG/ACT inhaler Inhale 2 puffs into the lungs 2 (two) times daily as needed (shortness of breath).     . Cholecalciferol (VITAMIN D) 50 MCG (2000 UT) CAPS Take 2,000 Units by mouth daily.    . Cyanocobalamin (B-12 COMPLIANCE INJECTION) 1000 MCG/ML KIT Inject 1,000 mcg as directed every 30 (thirty) days.     Marland Kitchen escitalopram (LEXAPRO) 5 MG tablet Take 5 mg by mouth at bedtime.    Marland Kitchen esomeprazole (NEXIUM) 40 MG capsule Take 40  mg by mouth 2 (two) times daily before a meal.     . fenofibrate 160 MG tablet Take 160 mg by mouth at bedtime.    . fexofenadine (ALLEGRA) 180 MG tablet Take 180 mg by mouth at bedtime.     . fluticasone (FLONASE) 50 MCG/ACT nasal spray Place 1 spray into the nose daily as needed for allergies.     . furosemide (LASIX) 20 MG tablet Take 20-40 mg by mouth See admin instructions. Take once a day. alternate taking 10m and 493m    . gabapentin (NEURONTIN) 300 MG capsule Take 300 mg by mouth at bedtime.     . Marland Kitchenlimepiride (AMARYL) 2 MG tablet Take 2 mg by mouth at bedtime.    . Marland KitchenUMALOG KWIKPEN 100 UNIT/ML KwikPen Inject 8 Units into the skin 3 (three) times daily. Before meals    . Hyoscyamine Sulfate 0.375 MG TBCR Take 0.375 mg by mouth 2 (two) times daily.     . Insulin Degludec (TRESIBA FLEXTOUCH [place]) Inject 44 Units into the skin at bedtime.     . Marland Kitchenevothyroxine (SYNTHROID,  LEVOTHROID) 75 MCG tablet Take 75 mcg by mouth daily before breakfast.     . magnesium oxide (MAG-OX) 400 MG tablet Take 400 mg by mouth 2 (two) times daily.     . meloxicam (MOBIC) 15 MG tablet Take 15 mg by mouth daily.    . metFORMIN (GLUCOPHAGE-XR) 750 MG 24 hr tablet Take 750 mg by mouth 2 (two) times daily.    . montelukast (SINGULAIR) 10 MG tablet Take 10 mg by mouth at bedtime.     . Marland Kitchenlopatadine (PATANOL) 0.1 % ophthalmic solution Place 1 drop into both eyes 2 (two) times daily as needed for allergies.     . potassium chloride SA (K-DUR,KLOR-CON) 20 MEQ tablet Take 20 mEq by mouth 2 (two) times daily.     . Marland Kitchenaccharomyces boulardii (FLORASTOR) 250 MG capsule Take 250 mg by mouth 2 (two) times daily.     . simvastatin (ZOCOR) 20 MG tablet Take 20 mg by mouth at bedtime.    . valACYclovir (VALTREX) 500 MG tablet Take 500 mg by mouth See admin instructions. For fever blisters take 50064mwice a day.  1  . valsartan (DIOVAN) 160 MG tablet Take 160 mg by mouth every morning.     . ondansetron (ZOFRAN) 4 MG tablet Take 4 mg by mouth every 8 (eight) hours as needed for nausea or vomiting.   1  . traMADol (ULTRAM) 50 MG tablet Take 1-2 tablets (50-100 mg total) by mouth every 6 (six) hours as needed. (Patient not taking: Reported on 04/12/2019) 35 tablet 0   No current facility-administered medications for this visit.    PHYSICAL EXAMINATION: ECOG PERFORMANCE STATUS: 1 - Symptomatic but completely ambulatory  BP (!) 141/50 (Patient Position: Sitting)   Pulse 69   Temp 97.9 F (36.6 C) (Tympanic)   Resp 20   Wt 166 lb (75.3 kg)   LMP  (LMP Unknown)   BMI 29.41 kg/m   Filed Weights   04/12/19 1507  Weight: 166 lb (75.3 kg)    Physical Exam  Constitutional: She is oriented to person, place, and time and well-developed, well-nourished, and in no distress.  HENT:  Head: Normocephalic and atraumatic.  Mouth/Throat: Oropharynx is clear and moist. No oropharyngeal exudate.  Eyes: Pupils  are equal, round, and reactive to light.  Cardiovascular: Normal rate and regular rhythm.  Pulmonary/Chest: No respiratory distress. She has no wheezes.  Abdominal: Soft. Bowel sounds are normal. She exhibits no distension and no mass. There is no abdominal tenderness. There is no rebound and no guarding.  Musculoskeletal:        General: No tenderness or edema. Normal range of motion.     Cervical back: Normal range of motion and neck supple.  Neurological: She is alert and oriented to person, place, and time.  Skin: Skin is warm.  Right and left BREAST exam (in the presence of nurse)- no unusual skin changes or dominant masses felt. Surgical scars noted.   Psychiatric: Affect normal.     LABORATORY DATA:  I have reviewed the data as listed    Component Value Date/Time   NA 139 04/12/2019 1450   K 3.7 04/12/2019 1450   CL 102 04/12/2019 1450   CO2 27 04/12/2019 1450   GLUCOSE 284 (H) 04/12/2019 1450   BUN 16 04/12/2019 1450   CREATININE 0.67 04/12/2019 1450   CALCIUM 9.3 04/12/2019 1450   PROT 8.0 04/12/2019 1450   ALBUMIN 3.8 04/12/2019 1450   AST 22 04/12/2019 1450   ALT 13 04/12/2019 1450   ALKPHOS 52 04/12/2019 1450   BILITOT 0.5 04/12/2019 1450   GFRNONAA >60 04/12/2019 1450   GFRAA >60 04/12/2019 1450    No results found for: SPEP, UPEP  Lab Results  Component Value Date   WBC 2.1 (L) 04/12/2019   NEUTROABS 1.1 (L) 04/12/2019   HGB 8.1 (L) 04/12/2019   HCT 28.1 (L) 04/12/2019   MCV 85.2 04/12/2019   PLT 114 (L) 04/12/2019      Chemistry      Component Value Date/Time   NA 139 04/12/2019 1450   K 3.7 04/12/2019 1450   CL 102 04/12/2019 1450   CO2 27 04/12/2019 1450   BUN 16 04/12/2019 1450   CREATININE 0.67 04/12/2019 1450      Component Value Date/Time   CALCIUM 9.3 04/12/2019 1450   ALKPHOS 52 04/12/2019 1450   AST 22 04/12/2019 1450   ALT 13 04/12/2019 1450   BILITOT 0.5 04/12/2019 1450       RADIOGRAPHIC STUDIES: I have personally  reviewed the radiological images as listed and agreed with the findings in the report. No results found.   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  Stable.  Mammogram December 2020-within normal limits.  Off AI because of intolerance.   # Anemia-worsening hemoglobin 8.3-unclear etiology/underlying cirrhosis.  Check iron studies ferritin.  Recommend p.o. iron once a day  # right chest wall posteior pain-unclear etiology likely musculoskeletal check x-rays ribs/thoracic spine x-ray.  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  ANC 1.0.  Platelets 90.  Overall stable.  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  # DISPOSITION: ADD iron studies/ferritin-  # X-rays in next 1-2 days.  # follow up in 3 months-MD-cbc/cmp- Possible Venofer- -Dr.B  Dr. Hall Busing   Orders Placed This Encounter  Procedures  . DG Thoracic Spine 2 View    Standing Status:   Future    Standing Expiration Date:   06/11/2020    Order Specific Question:   Reason for Exam (SYMPTOM  OR DIAGNOSIS REQUIRED)    Answer:   mid back pain    Order Specific Question:   Preferred imaging location?    Answer:   [facility]    Order  Specific Question:   Radiology Contrast Protocol - do NOT remove file path    Answer:   \\[email or web address]s.pdf  . DG Ribs Unilateral Right    Standing Status:   Future    Standing Expiration Date:   06/11/2020    Order Specific Question:   Reason for Exam (SYMPTOM  OR DIAGNOSIS REQUIRED)    Answer:   right posterior chest wall pain    Order Specific Question:   Preferred imaging location?    Answer:   [facility]    Order Specific Question:   Radiology Contrast Protocol - do NOT remove file path    Answer:    \\[email or web address]\[email or web address]\DXFluoroContrastProtocols.pdf   All questions were answered. The patient knows to call the clinic with any problems, questions or concerns.      Cammie Sickle, MD 04/13/2019 7:48 AM

## 2019-04-13 LAB — AFP TUMOR MARKER: AFP, Serum, Tumor Marker: 3 ng/mL (ref 0.0–8.3)

## 2019-04-26 ENCOUNTER — Telehealth: Payer: Self-pay | Admitting: *Deleted

## 2019-04-26 ENCOUNTER — Other Ambulatory Visit: Payer: Self-pay | Admitting: *Deleted

## 2019-04-26 ENCOUNTER — Other Ambulatory Visit: Payer: Self-pay | Admitting: Internal Medicine

## 2019-04-26 DIAGNOSIS — D0512 Intraductal carcinoma in situ of left breast: Secondary | ICD-10-CM

## 2019-04-26 DIAGNOSIS — E611 Iron deficiency: Secondary | ICD-10-CM | POA: Insufficient documentation

## 2019-04-26 DIAGNOSIS — D509 Iron deficiency anemia, unspecified: Secondary | ICD-10-CM

## 2019-04-26 NOTE — Telephone Encounter (Signed)
Dr. Brahmanday- please advise. 

## 2019-04-26 NOTE — Telephone Encounter (Signed)
-----   Message from Wallene Dales sent at 04/26/2019 10:22 AM EDT ----- Regarding: Schedule Iron Infusions Contact: [phone] Pt stated that she can't handle the iron pills and would rather have the iron infusions. She would like a call to discuss please.   Thank you Jerene Pitch

## 2019-04-26 NOTE — Addendum Note (Signed)
Addended by: Gloris Ham on: 04/26/2019 03:51 PM   Modules accepted: Orders

## 2019-04-26 NOTE — Telephone Encounter (Signed)
Per Dr. Rogue Bussing - please schedule lab/venofer next week. Please schedule Venofer weekly x 3 total treatments.

## 2019-05-05 ENCOUNTER — Inpatient Hospital Stay: Payer: Medicare PPO

## 2019-05-05 VITALS — BP 122/69 | HR 62 | Temp 97.0°F | Resp 19

## 2019-05-05 DIAGNOSIS — D0512 Intraductal carcinoma in situ of left breast: Secondary | ICD-10-CM | POA: Diagnosis not present

## 2019-05-05 DIAGNOSIS — E611 Iron deficiency: Secondary | ICD-10-CM

## 2019-05-05 DIAGNOSIS — D509 Iron deficiency anemia, unspecified: Secondary | ICD-10-CM

## 2019-05-05 LAB — CBC WITH DIFFERENTIAL/PLATELET
Abs Immature Granulocytes: 0 10*3/uL (ref 0.00–0.07)
Basophils Absolute: 0 10*3/uL (ref 0.0–0.1)
Basophils Relative: 1 %
Eosinophils Absolute: 0.1 10*3/uL (ref 0.0–0.5)
Eosinophils Relative: 3 %
HCT: 28 % — ABNORMAL LOW (ref 36.0–46.0)
Hemoglobin: 8.4 g/dL — ABNORMAL LOW (ref 12.0–15.0)
Immature Granulocytes: 0 %
Lymphocytes Relative: 38 %
Lymphs Abs: 0.8 10*3/uL (ref 0.7–4.0)
MCH: 24.6 pg — ABNORMAL LOW (ref 26.0–34.0)
MCHC: 30 g/dL (ref 30.0–36.0)
MCV: 82.1 fL (ref 80.0–100.0)
Monocytes Absolute: 0.3 10*3/uL (ref 0.1–1.0)
Monocytes Relative: 12 %
Neutro Abs: 1 10*3/uL — ABNORMAL LOW (ref 1.7–7.7)
Neutrophils Relative %: 46 %
Platelets: 96 10*3/uL — ABNORMAL LOW (ref 150–400)
RBC: 3.41 MIL/uL — ABNORMAL LOW (ref 3.87–5.11)
RDW: 16.4 % — ABNORMAL HIGH (ref 11.5–15.5)
WBC: 2.1 10*3/uL — ABNORMAL LOW (ref 4.0–10.5)
nRBC: 0 % (ref 0.0–0.2)

## 2019-05-05 LAB — FERRITIN: Ferritin: 8 ng/mL — ABNORMAL LOW (ref 11–307)

## 2019-05-05 LAB — IRON AND TIBC
Iron: 57 ug/dL (ref 28–170)
Saturation Ratios: 10 % — ABNORMAL LOW (ref 10.4–31.8)
TIBC: 571 ug/dL — ABNORMAL HIGH (ref 250–450)
UIBC: 514 ug/dL

## 2019-05-05 MED ORDER — SODIUM CHLORIDE 0.9 % IV SOLN
Freq: Once | INTRAVENOUS | Status: AC
  Filled 2019-05-05: qty 250

## 2019-05-05 MED ORDER — IRON SUCROSE 20 MG/ML IV SOLN
200.0000 mg | Freq: Once | INTRAVENOUS | Status: AC
  Administered 2019-05-05: 200 mg via INTRAVENOUS
  Filled 2019-05-05: qty 10

## 2019-05-05 NOTE — Progress Notes (Signed)
Pt tolerated first time venofer infusion well with no signs of complications or reaction. RN educated pt on the importance of notifying the clinic if any complications occur at home and if it is an emergency to call 911. Pt verbalized understanding and all questions answered at this time. VSS, pt stable for discharge.   Elika Godar [employer]

## 2019-05-12 ENCOUNTER — Other Ambulatory Visit: Payer: Self-pay

## 2019-05-12 ENCOUNTER — Inpatient Hospital Stay: Payer: Medicare PPO

## 2019-05-12 VITALS — BP 123/62 | HR 71 | Temp 96.7°F | Resp 18

## 2019-05-12 DIAGNOSIS — E611 Iron deficiency: Secondary | ICD-10-CM

## 2019-05-12 DIAGNOSIS — D0512 Intraductal carcinoma in situ of left breast: Secondary | ICD-10-CM | POA: Diagnosis not present

## 2019-05-12 MED ORDER — SODIUM CHLORIDE 0.9 % IV SOLN
Freq: Once | INTRAVENOUS | Status: AC
  Filled 2019-05-12: qty 250

## 2019-05-12 MED ORDER — IRON SUCROSE 20 MG/ML IV SOLN
200.0000 mg | Freq: Once | INTRAVENOUS | Status: AC
  Administered 2019-05-12: 200 mg via INTRAVENOUS
  Filled 2019-05-12: qty 10

## 2019-05-19 ENCOUNTER — Inpatient Hospital Stay: Payer: Medicare PPO | Attending: Internal Medicine

## 2019-05-19 ENCOUNTER — Other Ambulatory Visit: Payer: Self-pay

## 2019-05-19 VITALS — BP 119/51 | HR 63 | Temp 98.7°F | Resp 18

## 2019-05-19 DIAGNOSIS — Z794 Long term (current) use of insulin: Secondary | ICD-10-CM | POA: Diagnosis not present

## 2019-05-19 DIAGNOSIS — Z8042 Family history of malignant neoplasm of prostate: Secondary | ICD-10-CM | POA: Diagnosis not present

## 2019-05-19 DIAGNOSIS — Z8052 Family history of malignant neoplasm of bladder: Secondary | ICD-10-CM | POA: Diagnosis not present

## 2019-05-19 DIAGNOSIS — E611 Iron deficiency: Secondary | ICD-10-CM

## 2019-05-19 DIAGNOSIS — Z800 Family history of malignant neoplasm of digestive organs: Secondary | ICD-10-CM | POA: Diagnosis not present

## 2019-05-19 DIAGNOSIS — K7460 Unspecified cirrhosis of liver: Secondary | ICD-10-CM | POA: Insufficient documentation

## 2019-05-19 DIAGNOSIS — K7581 Nonalcoholic steatohepatitis (NASH): Secondary | ICD-10-CM | POA: Diagnosis not present

## 2019-05-19 DIAGNOSIS — K766 Portal hypertension: Secondary | ICD-10-CM | POA: Diagnosis not present

## 2019-05-19 DIAGNOSIS — D649 Anemia, unspecified: Secondary | ICD-10-CM | POA: Insufficient documentation

## 2019-05-19 DIAGNOSIS — Z923 Personal history of irradiation: Secondary | ICD-10-CM | POA: Diagnosis not present

## 2019-05-19 DIAGNOSIS — D61818 Other pancytopenia: Secondary | ICD-10-CM | POA: Insufficient documentation

## 2019-05-19 DIAGNOSIS — Z806 Family history of leukemia: Secondary | ICD-10-CM | POA: Insufficient documentation

## 2019-05-19 DIAGNOSIS — E119 Type 2 diabetes mellitus without complications: Secondary | ICD-10-CM | POA: Diagnosis not present

## 2019-05-19 DIAGNOSIS — D6959 Other secondary thrombocytopenia: Secondary | ICD-10-CM | POA: Insufficient documentation

## 2019-05-19 DIAGNOSIS — Z86000 Personal history of in-situ neoplasm of breast: Secondary | ICD-10-CM | POA: Insufficient documentation

## 2019-05-19 DIAGNOSIS — Z803 Family history of malignant neoplasm of breast: Secondary | ICD-10-CM | POA: Diagnosis not present

## 2019-05-19 MED ORDER — SODIUM CHLORIDE 0.9 % IV SOLN
Freq: Once | INTRAVENOUS | Status: AC
  Filled 2019-05-19: qty 250

## 2019-05-19 MED ORDER — IRON SUCROSE 20 MG/ML IV SOLN
200.0000 mg | Freq: Once | INTRAVENOUS | Status: AC
  Administered 2019-05-19: 200 mg via INTRAVENOUS
  Filled 2019-05-19: qty 10

## 2019-06-03 ENCOUNTER — Other Ambulatory Visit: Payer: Self-pay

## 2019-06-03 ENCOUNTER — Encounter: Payer: Self-pay | Admitting: Podiatry

## 2019-06-03 ENCOUNTER — Ambulatory Visit (INDEPENDENT_AMBULATORY_CARE_PROVIDER_SITE_OTHER): Payer: Medicare PPO | Admitting: Podiatry

## 2019-06-03 VITALS — Temp 97.9°F

## 2019-06-03 DIAGNOSIS — E1142 Type 2 diabetes mellitus with diabetic polyneuropathy: Secondary | ICD-10-CM

## 2019-06-03 DIAGNOSIS — M79675 Pain in left toe(s): Secondary | ICD-10-CM

## 2019-06-03 DIAGNOSIS — M79674 Pain in right toe(s): Secondary | ICD-10-CM

## 2019-06-03 DIAGNOSIS — B351 Tinea unguium: Secondary | ICD-10-CM

## 2019-06-03 DIAGNOSIS — M2042 Other hammer toe(s) (acquired), left foot: Secondary | ICD-10-CM

## 2019-06-03 DIAGNOSIS — M2041 Other hammer toe(s) (acquired), right foot: Secondary | ICD-10-CM

## 2019-06-03 DIAGNOSIS — M2010 Hallux valgus (acquired), unspecified foot: Secondary | ICD-10-CM

## 2019-06-03 NOTE — Progress Notes (Signed)
This patient returns to my office for at risk foot care.  This patient requires this care by a professional since this patient will be at risk due to having diabetes  This patient is unable to cut nails himself since the patient cannot reach his nails.These nails are painful walking and wearing shoes.  This patient presents for at risk foot care today.  Patient is interested in diabetic shoes.  Patient is taking gabapentin for neuropathy.  General Appearance  Alert, conversant and in no acute stress.  Vascular  Dorsalis pedis and posterior tibial  pulses are palpable  bilaterally.  Capillary return is within normal limits  bilaterally. Temperature is within normal limits  bilaterally.  Neurologic  Senn-Weinstein monofilament test dimininished  bilaterally. Muscle power within normal limits bilaterally.  Nails Thick disfigured discolored nails with subungual debris  from hallux to fifth toes bilaterally. No evidence of bacterial infection or drainage bilaterally.  Orthopedic  No limitations of motion  feet .  No crepitus or effusions noted.  No bony pathology or digital deformities noted. HAV  B/L.  Hammer toes  B/L.  Midfoot DJD  B/L.  Skin  normotropic skin with no porokeratosis noted bilaterally.  No signs of infections or ulcers noted.     Onychomycosis  Pain in right toes  Pain in left toes  Consent was obtained for treatment procedures.   Mechanical debridement of nails 1-5  bilaterally performed with a nail nipper.  Filed with dremel without incident.  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   Return office visit    3 months                 Told patient to return for periodic foot care and evaluation due to potential at risk complications.   Gardiner Barefoot DPM

## 2019-06-14 ENCOUNTER — Telehealth: Payer: Self-pay | Admitting: *Deleted

## 2019-06-14 NOTE — Telephone Encounter (Signed)
Patient called reporting that she does not feel any better after receiving the iron infusions and that she is tired and sleeping all the time. She also reports that she is pale and is asking for something to be done. Please advise

## 2019-06-14 NOTE — Telephone Encounter (Signed)
I spoke with Annette Foster, patient can be scheduled tomorrow afternoon.

## 2019-06-14 NOTE — Telephone Encounter (Signed)
Contacted patient. She can only come in the morning. She is taking her sister to another apt. Pt agreeable to apt at 10 am for labs and see Sonia Baller @ 10:30

## 2019-06-14 NOTE — Telephone Encounter (Signed)
Recommend seeing in [facility]- visit- labs- cbc/cmp/ldh   Thx, GB

## 2019-06-15 ENCOUNTER — Other Ambulatory Visit: Payer: Self-pay | Admitting: *Deleted

## 2019-06-15 ENCOUNTER — Encounter: Payer: Self-pay | Admitting: Oncology

## 2019-06-15 ENCOUNTER — Inpatient Hospital Stay (HOSPITAL_BASED_OUTPATIENT_CLINIC_OR_DEPARTMENT_OTHER): Payer: Medicare PPO | Admitting: Oncology

## 2019-06-15 ENCOUNTER — Other Ambulatory Visit: Payer: Self-pay

## 2019-06-15 ENCOUNTER — Inpatient Hospital Stay: Payer: Medicare PPO | Attending: Internal Medicine | Admitting: Oncology

## 2019-06-15 VITALS — BP 137/83 | HR 60 | Temp 97.6°F | Resp 18 | Wt 163.7 lb

## 2019-06-15 DIAGNOSIS — D61818 Other pancytopenia: Secondary | ICD-10-CM | POA: Insufficient documentation

## 2019-06-15 DIAGNOSIS — Z923 Personal history of irradiation: Secondary | ICD-10-CM | POA: Insufficient documentation

## 2019-06-15 DIAGNOSIS — Z794 Long term (current) use of insulin: Secondary | ICD-10-CM | POA: Diagnosis not present

## 2019-06-15 DIAGNOSIS — Z8042 Family history of malignant neoplasm of prostate: Secondary | ICD-10-CM | POA: Diagnosis not present

## 2019-06-15 DIAGNOSIS — Z8052 Family history of malignant neoplasm of bladder: Secondary | ICD-10-CM | POA: Insufficient documentation

## 2019-06-15 DIAGNOSIS — M549 Dorsalgia, unspecified: Secondary | ICD-10-CM | POA: Diagnosis not present

## 2019-06-15 DIAGNOSIS — K766 Portal hypertension: Secondary | ICD-10-CM | POA: Insufficient documentation

## 2019-06-15 DIAGNOSIS — D649 Anemia, unspecified: Secondary | ICD-10-CM | POA: Insufficient documentation

## 2019-06-15 DIAGNOSIS — Z800 Family history of malignant neoplasm of digestive organs: Secondary | ICD-10-CM | POA: Diagnosis not present

## 2019-06-15 DIAGNOSIS — D6959 Other secondary thrombocytopenia: Secondary | ICD-10-CM | POA: Diagnosis not present

## 2019-06-15 DIAGNOSIS — Z806 Family history of leukemia: Secondary | ICD-10-CM | POA: Diagnosis not present

## 2019-06-15 DIAGNOSIS — E119 Type 2 diabetes mellitus without complications: Secondary | ICD-10-CM | POA: Diagnosis not present

## 2019-06-15 DIAGNOSIS — Z86000 Personal history of in-situ neoplasm of breast: Secondary | ICD-10-CM | POA: Insufficient documentation

## 2019-06-15 DIAGNOSIS — R319 Hematuria, unspecified: Secondary | ICD-10-CM | POA: Insufficient documentation

## 2019-06-15 DIAGNOSIS — E611 Iron deficiency: Secondary | ICD-10-CM

## 2019-06-15 DIAGNOSIS — K7460 Unspecified cirrhosis of liver: Secondary | ICD-10-CM | POA: Diagnosis not present

## 2019-06-15 LAB — COMPREHENSIVE METABOLIC PANEL
ALT: 19 U/L (ref 0–44)
AST: 31 U/L (ref 15–41)
Albumin: 3.9 g/dL (ref 3.5–5.0)
Alkaline Phosphatase: 47 U/L (ref 38–126)
Anion gap: 9 (ref 5–15)
BUN: 10 mg/dL (ref 8–23)
CO2: 26 mmol/L (ref 22–32)
Calcium: 8.9 mg/dL (ref 8.9–10.3)
Chloride: 102 mmol/L (ref 98–111)
Creatinine, Ser: 0.69 mg/dL (ref 0.44–1.00)
GFR calc Af Amer: >60 mL/min (ref >60)
GFR calc non Af Amer: >60 mL/min (ref >60)
Glucose, Bld: 195 mg/dL — ABNORMAL HIGH (ref 70–99)
Potassium: 4.5 mmol/L (ref 3.5–5.1)
Sodium: 137 mmol/L (ref 135–145)
Total Bilirubin: 0.7 mg/dL (ref 0.3–1.2)
Total Protein: 7.8 g/dL (ref 6.5–8.1)

## 2019-06-15 LAB — CBC WITH DIFFERENTIAL/PLATELET
Abs Immature Granulocytes: 0 10*3/uL (ref 0.00–0.07)
Basophils Absolute: 0 10*3/uL (ref 0.0–0.1)
Basophils Relative: 1 %
Eosinophils Absolute: 0.1 10*3/uL (ref 0.0–0.5)
Eosinophils Relative: 4 %
HCT: 32.2 % — ABNORMAL LOW (ref 36.0–46.0)
Hemoglobin: 10.3 g/dL — ABNORMAL LOW (ref 12.0–15.0)
Immature Granulocytes: 0 %
Lymphocytes Relative: 38 %
Lymphs Abs: 0.6 10*3/uL — ABNORMAL LOW (ref 0.7–4.0)
MCH: 28.5 pg (ref 26.0–34.0)
MCHC: 32 g/dL (ref 30.0–36.0)
MCV: 89 fL (ref 80.0–100.0)
Monocytes Absolute: 0.2 10*3/uL (ref 0.1–1.0)
Monocytes Relative: 13 %
Neutro Abs: 0.7 10*3/uL — ABNORMAL LOW (ref 1.7–7.7)
Neutrophils Relative %: 44 %
Platelets: 85 10*3/uL — ABNORMAL LOW (ref 150–400)
RBC: 3.62 MIL/uL — ABNORMAL LOW (ref 3.87–5.11)
RDW: 17.4 % — ABNORMAL HIGH (ref 11.5–15.5)
WBC: 1.6 10*3/uL — ABNORMAL LOW (ref 4.0–10.5)
nRBC: 0 % (ref 0.0–0.2)

## 2019-06-15 LAB — URINALYSIS, COMPLETE (UACMP) WITH MICROSCOPIC
Bilirubin Urine: NEGATIVE
Glucose, UA: NEGATIVE mg/dL
Hgb urine dipstick: NEGATIVE
Ketones, ur: NEGATIVE mg/dL
Nitrite: NEGATIVE
Protein, ur: NEGATIVE mg/dL
Specific Gravity, Urine: 1.014 (ref 1.005–1.030)
pH: 6 (ref 5.0–8.0)

## 2019-06-15 LAB — TSH: TSH: 3.136 u[IU]/mL (ref 0.350–4.500)

## 2019-06-15 LAB — LACTATE DEHYDROGENASE: LDH: 165 U/L (ref 98–192)

## 2019-06-15 LAB — VITAMIN B12: Vitamin B-12: 370 pg/mL (ref 180–914)

## 2019-06-15 NOTE — Progress Notes (Addendum)
Symptom Management Consult note [facility]  Telephone:([phone] [phone]  Patient Care Team: Albina Billet, MD as PCP - General (Internal Medicine)   Name of the patient: Annette Foster  [id number]  May 17, 1940   Date of visit: 06/15/2019  Diagnosis: IDA  Chief Complaint: "feeling poorly post IV iron infusion"  Current Treatment: IV iron  Oncology History:  Oncology History Overview Note  # AUG 2017- DCIS LEFT BREAST ER/PR- POS; positive margins [Dr.Smith]; s/p re-excision; s/p RT [finish RT Nov 10th 2017]; START ARIMIDEX- Jan 2018; Stopped in July 2018- sec to muscle cramps; Sep 2018- start Letrozole.;  JAN 2019-STOPPED Letrozole sec night sweats/poor tolerance  # Mild pancytopenia [CTApril 2019-cirrhosis/spleneomegaly]. OCT 2018- BMBx- MILD dyspoiesis; variable cellularity [10-50%]; FISH/Cytogenetics-NORMAL; F-One-NGS-declined by insurance.    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 Brandon/uro-gyne,[facility]]  # HRT [clinical trial thru [place]; stopped July 2017 ]; CPAP   DIAGNOSIS: Left breast DCIS  STAGE:    0     ;GOALS: cure  CURRENT/MOST RECENT THERAPY: surveillaince    Ductal carcinoma in situ (DCIS) of left breast    Subjective Data: Patient presents to symptom management with past medical history significant for hypertension, hypothyroidism, diabetes, pancytopenia, DCIS of left breast and most recently iron deficiency.  She was last evaluated by Dr. Frutoso Schatz on 04/12/2019 for follow-up and surveillance of breast cancer and iron deficiency.  She was found to have a hemoglobin of 8.3.  Unclear etiology but likely related to underlying cirrhosis.  Lab work showed iron deficiency.  She was started on oral iron tablets morning which was unable to tolerate.  She received 3 infusions of IV Venofer for (05/05/2019, 05/12/2019, 05/19/2019).  Today, patient called clinic stating she does not feel any better post IV  iron infusions.  Endorses daily naps which is new for her.  Feels her skin color is paler than usual.  Admits to feeling better in the afternoons.  She is still able to complete all activities of daily living as prior to IV iron infusions.  She does note new onset hematuria and vaginal bleeding.  States she is followed by [facility] urology for this.  Vaginal bleeding and hematuria was minimal.  She has contacted [facility] for further guidance on this matter.  No other changes per patient.  States her blood sugars have been significantly elevated greater than 200 on most days.  She takes short acting and long-acting insulin per Dr. Hall Busing.  ECOG: 1 - Symptomatic but completely ambulatory   Review of Systems  Constitutional: Positive for malaise/fatigue. Negative for chills, fever and weight loss.  HENT: Negative for congestion, ear pain and tinnitus.   Eyes: Negative.  Negative for blurred vision and double vision.  Respiratory: Negative.  Negative for cough, sputum production and shortness of breath.   Cardiovascular: Negative.  Negative for chest pain, palpitations and leg swelling.  Gastrointestinal: Negative.  Negative for abdominal pain, constipation, diarrhea, nausea and vomiting.  Genitourinary: Positive for hematuria. Negative for dysuria, frequency and urgency.       Vaginal bleeding  Musculoskeletal: Negative for back pain and falls.  Skin: Negative.  Negative for rash.  Neurological: Negative.  Negative for weakness and headaches.  Endo/Heme/Allergies: Negative.  Does not bruise/bleed easily.  Psychiatric/Behavioral: Negative.  Negative for depression. The patient is not nervous/anxious and does not have insomnia.    Physical Exam Constitutional:      Appearance: Normal appearance.  HENT:     Head: Normocephalic  and atraumatic.  Eyes:     Pupils: Pupils are equal, round, and reactive to light.  Cardiovascular:     Rate and Rhythm: Normal rate and regular rhythm.     Heart sounds: Normal heart  sounds. No murmur.  Pulmonary:     Effort: Pulmonary effort is normal.     Breath sounds: Normal breath sounds. No wheezing.  Abdominal:     General: Bowel sounds are normal. There is no distension.     Palpations: Abdomen is soft.     Tenderness: There is no abdominal tenderness.  Musculoskeletal:        General: Normal range of motion.     Cervical back: Normal range of motion.  Skin:    General: Skin is warm and dry.     Findings: No rash.  Neurological:     Mental Status: She is alert and oriented to person, place, and time.  Psychiatric:        Judgment: Judgment normal.   Assessment  Mrs. Woolf presents to [facility] for lab work and assessment for persistent fatigue status post 3 IV Venofer infusions.  Given, she recently had her infusions and she is complaining of hematuria and vaginal bleeding we will collect a CBC, CMP to check for abnormalities.  Lab work shows improvement of her hemoglobin (10.3)-previously 8.4. We will add on TSH and vitamin B12 along with a UA given hematuria.  TSH-normal UA-Negative B12- pending  Unclear etiology, but likely unrelated to iron deficiency or history of DCIS.  Given lab work, is stable would recommend follow-up with Dr. Hall Busing with regards to her diabetes.  Blood sugars appear to be elevated.   Plan: Lab work-stable Follow-up with PCP Dr. Hall Busing Follow-up with [facility] urology for hematuria and vaginal bleeding RX Macrobid 100 mg BID X 5 days.   Disposition: RTC on 07/19/2019 for additional labs, MD assessment and possible IV Venofer.  Addendum: Urine culture revealed 80,000 E. coli UTI.  Given she is neutropenic with white count of 1.3 and ANC of 700, Dr. Yevette Edwards would recommend coverage with antibiotic.  Sensitivities are not back quite yet but reviewed previous sensitivities-we will try Macrobid x5 days.  Patient called and discussed results and new prescription.    Greater than 50% was spent in counseling and coordination of care with  this patient including but not limited to discussion of the relevant topics above (See A&P) including, but not limited to diagnosis and management of acute and chronic medical conditions.   Faythe Casa, NP 06/15/2019 1:51 PM

## 2019-06-16 MED ORDER — NITROFURANTOIN MONOHYD MACRO 100 MG PO CAPS: 100.0000 mg | ORAL_CAPSULE | Freq: Two times a day (BID) | ORAL | 0 refills | Status: DC

## 2019-06-16 NOTE — Addendum Note (Signed)
Addended by: Faythe Casa E on: 06/16/2019 04:20 PM   Modules accepted: Orders

## 2019-06-16 NOTE — Progress Notes (Signed)
Hey Dr. Beckey Rutter Mrs. Guerry Bruin yesterday for fatigue and she menioned some mild hematuria. She followed by [facility] urology for recurrent UTI's and has not been treated for one in over 1 year. What are your thoughts on this UA and urine culture?  Faythe Casa, NP 06/16/2019 11:44 AM

## 2019-06-17 ENCOUNTER — Ambulatory Visit: Payer: Medicare PPO | Admitting: Podiatry

## 2019-06-17 LAB — URINE CULTURE: Culture: 80000 — AB

## 2019-06-18 NOTE — Progress Notes (Signed)
Culture and sensitivity is back and she is on appropriate antibiotic coverage.   Faythe Casa, NP 06/18/2019 9:05 AM

## 2019-06-30 ENCOUNTER — Ambulatory Visit: Payer: Medicare PPO | Admitting: Orthotics

## 2019-06-30 ENCOUNTER — Other Ambulatory Visit: Payer: Self-pay

## 2019-06-30 DIAGNOSIS — E1142 Type 2 diabetes mellitus with diabetic polyneuropathy: Secondary | ICD-10-CM

## 2019-06-30 DIAGNOSIS — M2010 Hallux valgus (acquired), unspecified foot: Secondary | ICD-10-CM

## 2019-06-30 NOTE — Progress Notes (Signed)
Patient was measured for med necessity extra depth shoes (x2) w/ 3 pair (x6) custom f/o. Patient was measured w/ brannock to determine size and width.  Foam impression mold was achieved and deemed appropriate for fabrication of  cmfo.   See DPM chart notes for further documentation and dx codes for determination of medical necessity.  Appropriate forms will be sent to PCP to verify and sign off on medical necessity. Patient was measured for med necessity extra depth shoes (x2) w/ 3 pair (x6) custom f/o. Patient was measured w/ brannock to determine size and width.  Foam impression mold was achieved and deemed appropriate for fabrication of  cmfo.   See DPM chart notes for further documentation and dx codes for determination of medical necessity.  Appropriate forms will be sent to PCP to verify and sign off on medical necessity.

## 2019-07-16 ENCOUNTER — Other Ambulatory Visit: Payer: Self-pay | Admitting: *Deleted

## 2019-07-16 DIAGNOSIS — E611 Iron deficiency: Secondary | ICD-10-CM

## 2019-07-19 ENCOUNTER — Inpatient Hospital Stay (HOSPITAL_BASED_OUTPATIENT_CLINIC_OR_DEPARTMENT_OTHER): Payer: Medicare PPO | Admitting: Internal Medicine

## 2019-07-19 ENCOUNTER — Inpatient Hospital Stay: Payer: Medicare PPO

## 2019-07-19 ENCOUNTER — Encounter: Payer: Self-pay | Admitting: Internal Medicine

## 2019-07-19 ENCOUNTER — Other Ambulatory Visit: Payer: Self-pay

## 2019-07-19 ENCOUNTER — Inpatient Hospital Stay: Payer: Medicare PPO | Attending: Internal Medicine

## 2019-07-19 DIAGNOSIS — Z800 Family history of malignant neoplasm of digestive organs: Secondary | ICD-10-CM | POA: Diagnosis not present

## 2019-07-19 DIAGNOSIS — E611 Iron deficiency: Secondary | ICD-10-CM

## 2019-07-19 DIAGNOSIS — Z923 Personal history of irradiation: Secondary | ICD-10-CM | POA: Diagnosis not present

## 2019-07-19 DIAGNOSIS — K766 Portal hypertension: Secondary | ICD-10-CM | POA: Insufficient documentation

## 2019-07-19 DIAGNOSIS — D61818 Other pancytopenia: Secondary | ICD-10-CM | POA: Diagnosis not present

## 2019-07-19 DIAGNOSIS — K7581 Nonalcoholic steatohepatitis (NASH): Secondary | ICD-10-CM | POA: Diagnosis not present

## 2019-07-19 DIAGNOSIS — D6959 Other secondary thrombocytopenia: Secondary | ICD-10-CM | POA: Insufficient documentation

## 2019-07-19 DIAGNOSIS — Z8052 Family history of malignant neoplasm of bladder: Secondary | ICD-10-CM | POA: Diagnosis not present

## 2019-07-19 DIAGNOSIS — Z794 Long term (current) use of insulin: Secondary | ICD-10-CM | POA: Insufficient documentation

## 2019-07-19 DIAGNOSIS — K7460 Unspecified cirrhosis of liver: Secondary | ICD-10-CM | POA: Diagnosis not present

## 2019-07-19 DIAGNOSIS — Z86000 Personal history of in-situ neoplasm of breast: Secondary | ICD-10-CM | POA: Diagnosis not present

## 2019-07-19 DIAGNOSIS — E119 Type 2 diabetes mellitus without complications: Secondary | ICD-10-CM | POA: Insufficient documentation

## 2019-07-19 DIAGNOSIS — D0512 Intraductal carcinoma in situ of left breast: Secondary | ICD-10-CM

## 2019-07-19 DIAGNOSIS — Z806 Family history of leukemia: Secondary | ICD-10-CM | POA: Insufficient documentation

## 2019-07-19 DIAGNOSIS — Z8042 Family history of malignant neoplasm of prostate: Secondary | ICD-10-CM | POA: Diagnosis not present

## 2019-07-19 DIAGNOSIS — D649 Anemia, unspecified: Secondary | ICD-10-CM | POA: Diagnosis present

## 2019-07-19 LAB — CBC WITH DIFFERENTIAL/PLATELET
Abs Immature Granulocytes: 0 10*3/uL (ref 0.00–0.07)
Basophils Absolute: 0 10*3/uL (ref 0.0–0.1)
Basophils Relative: 1 %
Eosinophils Absolute: 0.1 10*3/uL (ref 0.0–0.5)
Eosinophils Relative: 3 %
HCT: 33.1 % — ABNORMAL LOW (ref 36.0–46.0)
Hemoglobin: 10.9 g/dL — ABNORMAL LOW (ref 12.0–15.0)
Immature Granulocytes: 0 %
Lymphocytes Relative: 37 %
Lymphs Abs: 0.8 10*3/uL (ref 0.7–4.0)
MCH: 28.7 pg (ref 26.0–34.0)
MCHC: 32.9 g/dL (ref 30.0–36.0)
MCV: 87.1 fL (ref 80.0–100.0)
Monocytes Absolute: 0.2 10*3/uL (ref 0.1–1.0)
Monocytes Relative: 8 %
Neutro Abs: 1.1 10*3/uL — ABNORMAL LOW (ref 1.7–7.7)
Neutrophils Relative %: 51 %
Platelets: 87 10*3/uL — ABNORMAL LOW (ref 150–400)
RBC: 3.8 MIL/uL — ABNORMAL LOW (ref 3.87–5.11)
RDW: 14.3 % (ref 11.5–15.5)
WBC: 2.1 10*3/uL — ABNORMAL LOW (ref 4.0–10.5)
nRBC: 0 % (ref 0.0–0.2)

## 2019-07-19 LAB — COMPREHENSIVE METABOLIC PANEL
ALT: 18 U/L (ref 0–44)
AST: 27 U/L (ref 15–41)
Albumin: 3.7 g/dL (ref 3.5–5.0)
Alkaline Phosphatase: 51 U/L (ref 38–126)
Anion gap: 9 (ref 5–15)
BUN: 17 mg/dL (ref 8–23)
CO2: 26 mmol/L (ref 22–32)
Calcium: 9 mg/dL (ref 8.9–10.3)
Chloride: 101 mmol/L (ref 98–111)
Creatinine, Ser: 0.69 mg/dL (ref 0.44–1.00)
GFR calc Af Amer: >60 mL/min (ref >60)
GFR calc non Af Amer: >60 mL/min (ref >60)
Glucose, Bld: 315 mg/dL — ABNORMAL HIGH (ref 70–99)
Potassium: 4.7 mmol/L (ref 3.5–5.1)
Sodium: 136 mmol/L (ref 135–145)
Total Bilirubin: 0.5 mg/dL (ref 0.3–1.2)
Total Protein: 7.8 g/dL (ref 6.5–8.1)

## 2019-07-19 MED ORDER — IRON SUCROSE 20 MG/ML IV SOLN
200.0000 mg | Freq: Once | INTRAVENOUS | Status: AC
  Administered 2019-07-19: 200 mg via INTRAVENOUS
  Filled 2019-07-19: qty 10

## 2019-07-19 MED ORDER — SODIUM CHLORIDE 0.9 % IV SOLN
Freq: Once | INTRAVENOUS | Status: AC
  Filled 2019-07-19: qty 250

## 2019-07-19 NOTE — Progress Notes (Signed)
[place] OFFICE PROGRESS NOTE  Patient Care Team: Albina Billet, MD as PCP - General (Internal Medicine)  Cancer Staging No matching staging information was found for the patient.   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 OCT 2018- BMBx- MILD dyspoiesis; variable cellularity [10-50%]; FISH/Cytogenetics-NORMAL; F-One-NGS-declined by insurance.    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 Brandon/uro-gyne,[facility]]  # HRT [clinical trial thru [facility]; stopped July 2017 ]; CPAP   DIAGNOSIS: Left breast DCIS  STAGE:    0     ;GOALS: cure  CURRENT/MOST RECENT THERAPY: surveillaince    Ductal carcinoma in situ (DCIS) of left breast    INTERVAL HISTORY:  Annette Foster 79 y.o.  female pleasant patient above history of DCIS; cirrhosis portal hypertension is here for follow-up.  Patient is under stress given her husband's ill health.  She is also her sister-in-law's caregiver.  Patient had recent UTI treated with antibiotic.  Following up with [facility] urology.  Denies any fevers or chills.  Denies any admission to hospital.  No swelling in the legs.  No abdominal distention.  Review of Systems  Constitutional: Positive for malaise/fatigue. Negative for chills, diaphoresis, fever and weight loss.  HENT: Negative for nosebleeds and sore throat.   Eyes: Negative for double vision.  Respiratory: Negative for cough, hemoptysis, sputum production, shortness of breath and wheezing.   Cardiovascular: Negative for chest pain, palpitations, orthopnea and leg swelling.  Gastrointestinal: Negative for abdominal pain, blood in  stool, constipation, diarrhea, heartburn, melena, nausea and vomiting.  Musculoskeletal: Positive for back pain and joint pain.  Skin: Negative.  Negative for itching and rash.  Neurological: Negative for dizziness, tingling, focal weakness, weakness and headaches.  Endo/Heme/Allergies: Does not bruise/bleed easily.  Psychiatric/Behavioral: Negative for depression. The patient is not nervous/anxious and does not have insomnia.       PAST MEDICAL HISTORY :  Past Medical History:  Diagnosis Date  . Abdominal pain   . Allergy   . Cancer [facility]) 2017   breast cancer- Left  . Cataract   . CHF (congestive heart failure) ([place])   . Collagenous colitis   . Diabetes mellitus without complication ([place])   . Diarrhea   . Diverticulosis   . Fatty liver disease, nonalcoholic   . Fibrocystic breast   . GERD (gastroesophageal reflux disease)   . Heart murmur   . Hyperlipidemia   . Hypertension   . Hypothyroidism   . IBS (irritable bowel syndrome)   . IBS (irritable bowel syndrome)   . IDA (iron deficiency anemia)   . Personal history of radiation therapy 2017   LEFT BREAST CA  . PONV (postoperative nausea and vomiting)   . Sleep apnea    C-Pap    PAST SURGICAL HISTORY :   Past Surgical History:  Procedure Laterality Date  . ABDOMINAL HYSTERECTOMY     [place] bso  . ABDOMINAL SURGERY    . APPENDECTOMY    . BREAST BIOPSY Bilateral 2016   negative  . 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 BREAST BIOPSY Left 05/27/2016   BENIGN MAMMARY EPITHELIUM  . BREAST BIOPSY Left 11/20/2017   affirm bx  x clip BENIGN MAMMARY EPITHELIUM CONSISTENT WITH RAD THERAPY  . BREAST LUMPECTOMY Left 10/17/2015   DCIS and papillary carcinoma insitu, clear margins  . CARDIAC SURGERY     has replacement valve  . CATARACT EXTRACTION Right   . CHOLECYSTECTOMY    . COLONOSCOPY WITH PROPOFOL N/A 03/11/2016   Procedure: COLONOSCOPY WITH PROPOFOL;  Surgeon: Manya Silvas, MD;   Location: [facility] ENDOSCOPY;  Service: Endoscopy;  Laterality: N/A;  . ESOPHAGOGASTRODUODENOSCOPY (EGD) WITH PROPOFOL N/A 03/11/2016   Procedure: ESOPHAGOGASTRODUODENOSCOPY (EGD) WITH PROPOFOL;  Surgeon: Manya Silvas, MD;  Location: [facility] ENDOSCOPY;  Service: Endoscopy;  Laterality: N/A;  . FINGER ARTHROSCOPY WITH CARPOMETACARPEL ([place]) ARTHROPLASTY Right 09/03/2018   Procedure: CARPOMETACARPEL [facility]) ARTHROPLASTY RIGHT THUMB;  Surgeon: Hessie Knows, MD;  Location: [facility] ORS;  Service: Orthopedics;  Laterality: Right;  . GANGLION CYST EXCISION Right 09/03/2018   Procedure: REMOVAL GANGLION OF WRIST;  Surgeon: Hessie Knows, MD;  Location: [facility] ORS;  Service: Orthopedics;  Laterality: Right;  . HARDWARE REMOVAL Right 09/03/2018   Procedure: HARDWARE REMOVAL RIGHT THUMB;  Surgeon: Hessie Knows, MD;  Location: [facility] ORS;  Service: Orthopedics;  Laterality: Right;  staple removed  . JOINT REPLACEMENT Left    TKR  . left sinusplasty     . MASTECTOMY, PARTIAL Left 10/17/2015   Procedure: MASTECTOMY PARTIAL REVISION;  Surgeon: Leonie Green, MD;  Location: [facility] ORS;  Service: General;  Laterality: Left;  . PARTIAL MASTECTOMY WITH NEEDLE LOCALIZATION Left 09/29/2015   Procedure: PARTIAL MASTECTOMY WITH NEEDLE LOCALIZATION;  Surgeon: Leonie Green, MD;  Location: [facility] ORS;  Service: General;  Laterality: Left;  . TOTAL ABDOMINAL HYSTERECTOMY W/ BILATERAL SALPINGOOPHORECTOMY      FAMILY HISTORY :   Family History  Problem Relation Age of Onset  . Breast cancer Paternal Grandmother   . Colon cancer Father   . Diabetes Sister   . Diabetes Brother   . Heart disease Brother   . Prostate cancer Brother   . Colon cancer Maternal Uncle   . Prostate cancer Brother   . Bladder Cancer Brother   . Leukemia Mother        all  . Ovarian cancer Neg Hx   . Kidney cancer Neg Hx     SOCIAL HISTORY:   Social History   Tobacco Use  . Smoking status: Never Smoker  . Smokeless tobacco: Never Used   Substance Use Topics  . Alcohol use: No    Alcohol/week: 0.0 standard drinks  . Drug use: No    ALLERGIES:  is allergic to codeine; demeclocycline; demerol [meperidine]; hydrocodone; oxycodone; pentazocine; tetracyclines & related; and fentanyl.  MEDICATIONS:  Current Outpatient Medications  Medication Sig Dispense Refill  . aspirin 81 MG tablet Take 81 mg by mouth at bedtime.    . benzonatate (TESSALON) 200 MG capsule Take 200 mg by mouth 3 (three) times daily as needed for cough.    . budesonide-formoterol (SYMBICORT) 80-4.5 MCG/ACT inhaler Inhale 2 puffs into the lungs 2 (two) times daily as needed (shortness of breath).     . Cholecalciferol (VITAMIN D) 50 MCG (2000 UT) CAPS Take 2,000 Units by mouth daily.    . cyanocobalamin (,VITAMIN B-12,) 1000 MCG/ML injection     . Cyanocobalamin (B-12 COMPLIANCE INJECTION) 1000 MCG/ML KIT Inject 1,000 mcg as directed every 30 (thirty) days.     Marland Kitchen desloratadine (CLARINEX) 5 MG tablet Take 5 mg by mouth daily.    Marland Kitchen dicyclomine (BENTYL) 10 MG capsule     .  EPINEPHrine 0.3 mg/0.3 mL IJ SOAJ injection     . escitalopram (LEXAPRO) 5 MG tablet Take 5 mg by mouth at bedtime.    Marland Kitchen esomeprazole (NEXIUM) 40 MG capsule Take 40 mg by mouth 2 (two) times daily before a meal.     . fenofibrate 160 MG tablet Take 160 mg by mouth at bedtime.    . fluticasone (FLONASE) 50 MCG/ACT nasal spray Place 1 spray into the nose daily as needed for allergies.     . furosemide (LASIX) 20 MG tablet Take 20-40 mg by mouth See admin instructions. Take once a day. alternate taking 7m and 473m    . gabapentin (NEURONTIN) 300 MG capsule Take 300 mg by mouth at bedtime.     . Marland Kitchenlimepiride (AMARYL) 2 MG tablet Take 2 mg by mouth at bedtime.    . Marland KitchenUMALOG KWIKPEN 100 UNIT/ML KwikPen Inject 8 Units into the skin 3 (three) times daily. Before meals    . Hyoscyamine Sulfate 0.375 MG TBCR Take 0.375 mg by mouth 2 (two) times daily.     . Insulin Degludec (TRESIBA FLEXTOUCH [place])  Inject 44 Units into the skin at bedtime.     . Marland Kitchenevothyroxine (SYNTHROID, LEVOTHROID) 75 MCG tablet Take 75 mcg by mouth daily before breakfast.     . magnesium oxide (MAG-OX) 400 MG tablet Take 400 mg by mouth 2 (two) times daily.     . meloxicam (MOBIC) 15 MG tablet Take 15 mg by mouth daily.    . metFORMIN (GLUCOPHAGE-XR) 750 MG 24 hr tablet Take 750 mg by mouth 2 (two) times daily.    . nitrofurantoin, macrocrystal-monohydrate, (MACROBID) 100 MG capsule Take 1 capsule (100 mg total) by mouth 2 (two) times daily. 10 capsule 0  . olopatadine (PATANOL) 0.1 % ophthalmic solution Place 1 drop into both eyes 2 (two) times daily as needed for allergies.     . Marland Kitchenndansetron (ZOFRAN) 4 MG tablet Take 4 mg by mouth every 8 (eight) hours as needed for nausea or vomiting.   1  . potassium chloride SA (K-DUR,KLOR-CON) 20 MEQ tablet Take 20 mEq by mouth 2 (two) times daily.     . Marland Kitchenaccharomyces boulardii (FLORASTOR) 250 MG capsule Take 250 mg by mouth 2 (two) times daily.     . simvastatin (ZOCOR) 20 MG tablet Take 20 mg by mouth at bedtime.    . traMADol (ULTRAM) 50 MG tablet Take 1-2 tablets (50-100 mg total) by mouth every 6 (six) hours as needed. 35 tablet 0  . valACYclovir (VALTREX) 500 MG tablet Take 500 mg by mouth See admin instructions. For fever blisters take [id number] a day.  1  . valsartan (DIOVAN) 160 MG tablet Take 160 mg by mouth every morning.      No current facility-administered medications for this visit.    PHYSICAL EXAMINATION: ECOG PERFORMANCE STATUS: 1 - Symptomatic but completely ambulatory  BP 124/61 (BP Location: Left Arm, Patient Position: Sitting, Cuff Size: Normal)   Pulse 70   Temp 97.6 F (36.4 C) (Tympanic)   Wt 161 lb (73 kg)   LMP  (LMP Unknown)   BMI 28.52 kg/m   Filed Weights   07/19/19 1318  Weight: 161 lb (73 kg)    Physical Exam  Constitutional: She is oriented to person, place, and time and well-developed, well-nourished, and in no distress.  HENT:   Head: Normocephalic and atraumatic.  Mouth/Throat: Oropharynx is clear and moist. No oropharyngeal exudate.  Eyes: Pupils are equal, round, and  reactive to light.  Cardiovascular: Normal rate and regular rhythm.  Pulmonary/Chest: No respiratory distress. She has no wheezes.  Abdominal: Soft. Bowel sounds are normal. She exhibits no distension and no mass. There is no abdominal tenderness. There is no rebound and no guarding.  Musculoskeletal:        General: No tenderness or edema. Normal range of motion.     Cervical back: Normal range of motion and neck supple.  Neurological: She is alert and oriented to person, place, and time.  Skin: Skin is warm.  Right and left BREAST exam (in the presence of nurse)- no unusual skin changes or dominant masses felt. Surgical scars noted.   Psychiatric: Affect normal.     LABORATORY DATA:  I have reviewed the data as listed    Component Value Date/Time   NA 136 07/19/2019 1304   K 4.7 07/19/2019 1304   CL 101 07/19/2019 1304   CO2 26 07/19/2019 1304   GLUCOSE 315 (H) 07/19/2019 1304   BUN 17 07/19/2019 1304   CREATININE 0.69 07/19/2019 1304   CALCIUM 9.0 07/19/2019 1304   PROT 7.8 07/19/2019 1304   ALBUMIN 3.7 07/19/2019 1304   AST 27 07/19/2019 1304   ALT 18 07/19/2019 1304   ALKPHOS 51 07/19/2019 1304   BILITOT 0.5 07/19/2019 1304   GFRNONAA >60 07/19/2019 1304   GFRAA >60 07/19/2019 1304    No results found for: SPEP, UPEP  Lab Results  Component Value Date   WBC 2.1 (L) 07/19/2019   NEUTROABS 1.1 (L) 07/19/2019   HGB 10.9 (L) 07/19/2019   HCT 33.1 (L) 07/19/2019   MCV 87.1 07/19/2019   PLT 87 (L) 07/19/2019      Chemistry      Component Value Date/Time   NA 136 07/19/2019 1304   K 4.7 07/19/2019 1304   CL 101 07/19/2019 1304   CO2 26 07/19/2019 1304   BUN 17 07/19/2019 1304   CREATININE 0.69 07/19/2019 1304      Component Value Date/Time   CALCIUM 9.0 07/19/2019 1304   ALKPHOS 51 07/19/2019 1304   AST 27  07/19/2019 1304   ALT 18 07/19/2019 1304   BILITOT 0.5 07/19/2019 1304       RADIOGRAPHIC STUDIES: I have personally reviewed the radiological images as listed and agreed with the findings in the report. No results found.   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  Stable.  Mammogram December 2020-within normal limits.  Stable off AI because of intolerance.   # Anemia-worsening hemoglobin 10.3 s/p IV iron infusion-unclear etiology/underlying cirrhosis; IDA-Recommend IV iron.   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  ANC-1.2. Platelets 80-90s. stable.  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 Hall Busing   Orders Placed This Encounter  Procedures  . CBC with Differential    Standing Status:   Future    Standing Expiration Date:   07/18/2020  . Comprehensive metabolic panel    Standing Status:   Future    Standing Expiration Date:   07/18/2020   All questions were answered. The patient knows to call the clinic with any problems, questions or concerns.      Cammie Sickle, MD 07/19/2019 2:08 PM

## 2019-07-19 NOTE — Assessment & Plan Note (Addendum)
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  Stable.  Mammogram December 2020-within normal limits.  Stable off AI because of intolerance.   # Anemia-worsening hemoglobin 10.3 s/p IV iron infusion-unclear etiology/underlying cirrhosis; IDA-Recommend IV iron.   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  ANC-1.2. Platelets 80-90s. stable.  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 Hall Busing

## 2019-08-02 ENCOUNTER — Inpatient Hospital Stay: Payer: Medicare PPO

## 2019-08-03 ENCOUNTER — Inpatient Hospital Stay: Payer: Medicare PPO

## 2019-08-03 ENCOUNTER — Other Ambulatory Visit: Payer: Self-pay

## 2019-08-03 VITALS — BP 103/48 | HR 60 | Temp 98.2°F | Resp 20

## 2019-08-03 DIAGNOSIS — E611 Iron deficiency: Secondary | ICD-10-CM

## 2019-08-03 MED ORDER — IRON SUCROSE 20 MG/ML IV SOLN
200.0000 mg | Freq: Once | INTRAVENOUS | Status: AC
  Administered 2019-08-03: 200 mg via INTRAVENOUS
  Filled 2019-08-03: qty 10

## 2019-08-03 MED ORDER — SODIUM CHLORIDE 0.9 % IV SOLN
Freq: Once | INTRAVENOUS | Status: AC
  Filled 2019-08-03: qty 250

## 2019-08-12 ENCOUNTER — Encounter: Payer: Medicare Other | Admitting: Obstetrics and Gynecology

## 2019-08-18 ENCOUNTER — Ambulatory Visit (INDEPENDENT_AMBULATORY_CARE_PROVIDER_SITE_OTHER): Payer: Medicare PPO | Admitting: Obstetrics and Gynecology

## 2019-08-18 ENCOUNTER — Encounter: Payer: Self-pay | Admitting: Obstetrics and Gynecology

## 2019-08-18 ENCOUNTER — Other Ambulatory Visit: Payer: Self-pay

## 2019-08-18 VITALS — BP 149/69 | HR 73 | Ht 63.0 in | Wt 162.5 lb

## 2019-08-18 DIAGNOSIS — L900 Lichen sclerosus et atrophicus: Secondary | ICD-10-CM | POA: Diagnosis not present

## 2019-08-18 DIAGNOSIS — R319 Hematuria, unspecified: Secondary | ICD-10-CM

## 2019-08-18 DIAGNOSIS — N952 Postmenopausal atrophic vaginitis: Secondary | ICD-10-CM

## 2019-08-18 LAB — POCT URINALYSIS DIPSTICK OB
Bilirubin, UA: NEGATIVE
Blood, UA: NEGATIVE
Ketones, UA: NEGATIVE
Leukocytes, UA: NEGATIVE
Nitrite, UA: NEGATIVE
POC,PROTEIN,UA: NEGATIVE
Spec Grav, UA: 1.015 (ref 1.010–1.025)
Urobilinogen, UA: 0.2 E.U./dL
pH, UA: 6 (ref 5.0–8.0)

## 2019-08-18 MED ORDER — CLOBETASOL PROPIONATE 0.05 % EX CREA: 1.0000 "application " | TOPICAL_CREAM | CUTANEOUS | 1 refills | Status: DC

## 2019-08-18 MED ORDER — ESTRADIOL 0.1 MG/GM VA CREA: 0.2500 | TOPICAL_CREAM | VAGINAL | 2 refills | Status: DC

## 2019-08-18 NOTE — Progress Notes (Signed)
HPI:      [name] is a 79 y.o. [id number] who LMP was No LMP recorded (lmp unknown). Patient has had a hysterectomy.  Subjective:   She presents today stating that she has noted some vaginal bleeding when she wipes after urination. Patient has a history of vaginal atrophy and lichen sclerosis. She is not using any type of estrogen intravaginally. She occasionally uses clobetasol externally. Patient has a history of breast cancer. Previous history of hysterectomy.    Hx: The following portions of the patient's history were reviewed and updated as appropriate:             She  has a past medical history of Abdominal pain, Allergy, Cancer ([place]) (2017), Cataract, CHF (congestive heart failure) ([place]), Collagenous colitis, Diabetes mellitus without complication ([place]), Diarrhea, Diverticulosis, Fatty liver disease, nonalcoholic, Fibrocystic breast, GERD (gastroesophageal reflux disease), Heart murmur, Hyperlipidemia, Hypertension, Hypothyroidism, IBS (irritable bowel syndrome), IBS (irritable bowel syndrome), IDA (iron deficiency anemia), Personal history of radiation therapy (2017), PONV (postoperative nausea and vomiting), and Sleep apnea. She does not have any pertinent problems on file. She  has a past surgical history that includes Cardiac surgery; Abdominal surgery; Cholecystectomy; left sinusplasty ; Joint replacement (Left); Cataract extraction (Right); Partial mastectomy with needle localization (Left, 09/29/2015); Mastectomy, partial (Left, 10/17/2015); Total abdominal hysterectomy w/ bilateral salpingoophorectomy; Appendectomy; Colonoscopy with propofol (N/A, 03/11/2016); Esophagogastroduodenoscopy (egd) with propofol (N/A, 03/11/2016); Abdominal hysterectomy; Hardware Removal (Right, 09/03/2018); Ganglion cyst excision (Right, 09/03/2018); Finger arthroscopy with carpometacarpel(cmc) arthroplasty (Right, 09/03/2018); Breast biopsy (Bilateral, 2016); Breast biopsy (Left, 09/11/2015); Breast  biopsy (Left, 05/27/2016); Breast biopsy (Left, 11/20/2017); and Breast lumpectomy (Left, 10/17/2015). Her family history includes Bladder Cancer in her brother; Breast cancer in her paternal grandmother; Colon cancer in her father and maternal uncle; Diabetes in her brother and sister; Heart disease in her brother; Leukemia in her mother; Prostate cancer in her brother and brother. She  reports that she has never smoked. She has never used smokeless tobacco. She reports that she does not drink alcohol and does not use drugs. She has a current medication list which includes the following prescription(s): aspirin, benzonatate, budesonide-formoterol, vitamin d, cyanocobalamin, cyanocobalamin, desloratadine, dicyclomine, epinephrine, escitalopram, esomeprazole, fenofibrate, fluticasone, furosemide, gabapentin, glimepiride, humalog kwikpen, hyoscyamine sulfate, insulin degludec, levothyroxine, magnesium oxide, meloxicam, metformin, nitrofurantoin (macrocrystal-monohydrate), olopatadine, ondansetron, potassium chloride sa, saccharomyces boulardii, simvastatin, tramadol, valacyclovir, valsartan, [START ON 08/19/2019] clobetasol cream, and [START ON 08/19/2019] estradiol. She is allergic to codeine, demeclocycline, demerol [meperidine], hydrocodone, oxycodone, pentazocine, tetracyclines & related, and fentanyl.       Review of Systems:  Review of Systems  Constitutional: Denied constitutional symptoms, night sweats, recent illness, fatigue, fever, insomnia and weight loss.  Eyes: Denied eye symptoms, eye pain, photophobia, vision change and visual disturbance.  Ears/Nose/Throat/Neck: Denied ear, nose, throat or neck symptoms, hearing loss, nasal discharge, sinus congestion and sore throat.  Cardiovascular: Denied cardiovascular symptoms, arrhythmia, chest pain/pressure, edema, exercise intolerance, orthopnea and palpitations.  Respiratory: Denied pulmonary symptoms, asthma, pleuritic pain, productive sputum,  cough, dyspnea and wheezing.  Gastrointestinal: Denied, gastro-esophageal reflux, melena, nausea and vomiting.  Genitourinary: See HPI for additional information.  Musculoskeletal: Denied musculoskeletal symptoms, stiffness, swelling, muscle weakness and myalgia.  Dermatologic: Denied dermatology symptoms, rash and scar.  Neurologic: Denied neurology symptoms, dizziness, headache, neck pain and syncope.  Psychiatric: Denied psychiatric symptoms, anxiety and depression.  Endocrine: Denied endocrine symptoms including hot flashes and night sweats.   Meds:   Current Outpatient Medications on File Prior to Visit  Medication Sig Dispense Refill  . aspirin 81 MG tablet Take 81 mg by mouth at bedtime.    . benzonatate (TESSALON) 200 MG capsule Take 200 mg by mouth 3 (three) times daily as needed for cough.    . budesonide-formoterol (SYMBICORT) 80-4.5 MCG/ACT inhaler Inhale 2 puffs into the lungs 2 (two) times daily as needed (shortness of breath).     . Cholecalciferol (VITAMIN D) 50 MCG (2000 UT) CAPS Take 2,000 Units by mouth daily.    . cyanocobalamin (,VITAMIN B-12,) 1000 MCG/ML injection     . Cyanocobalamin (B-12 COMPLIANCE INJECTION) 1000 MCG/ML KIT Inject 1,000 mcg as directed every 30 (thirty) days.     Marland Kitchen desloratadine (CLARINEX) 5 MG tablet Take 5 mg by mouth daily.    Marland Kitchen dicyclomine (BENTYL) 10 MG capsule     . EPINEPHrine 0.3 mg/0.3 mL IJ SOAJ injection     . escitalopram (LEXAPRO) 5 MG tablet Take 5 mg by mouth at bedtime.    Marland Kitchen esomeprazole (NEXIUM) 40 MG capsule Take 40 mg by mouth 2 (two) times daily before a meal.     . fenofibrate 160 MG tablet Take 160 mg by mouth at bedtime.    . fluticasone (FLONASE) 50 MCG/ACT nasal spray Place 1 spray into the nose daily as needed for allergies.     . furosemide (LASIX) 20 MG tablet Take 20-40 mg by mouth See admin instructions. Take once a day. alternate taking 53m and 461m    . gabapentin (NEURONTIN) 300 MG capsule Take 300 mg by mouth  at bedtime.     . Marland Kitchenlimepiride (AMARYL) 2 MG tablet Take 2 mg by mouth at bedtime.    . Marland KitchenUMALOG KWIKPEN 100 UNIT/ML KwikPen Inject 8 Units into the skin 3 (three) times daily. Before meals    . Hyoscyamine Sulfate 0.375 MG TBCR Take 0.375 mg by mouth 2 (two) times daily.     . Insulin Degludec (TRESIBA FLEXTOUCH [place]) Inject 44 Units into the skin at bedtime.     . Marland Kitchenevothyroxine (SYNTHROID, LEVOTHROID) 75 MCG tablet Take 75 mcg by mouth daily before breakfast.     . magnesium oxide (MAG-OX) 400 MG tablet Take 400 mg by mouth 2 (two) times daily.     . meloxicam (MOBIC) 15 MG tablet Take 15 mg by mouth daily.    . metFORMIN (GLUCOPHAGE-XR) 750 MG 24 hr tablet Take 750 mg by mouth 2 (two) times daily.    . nitrofurantoin, macrocrystal-monohydrate, (MACROBID) 100 MG capsule Take 1 capsule (100 mg total) by mouth 2 (two) times daily. 10 capsule 0  . olopatadine (PATANOL) 0.1 % ophthalmic solution Place 1 drop into both eyes 2 (two) times daily as needed for allergies.     . Marland Kitchenndansetron (ZOFRAN) 4 MG tablet Take 4 mg by mouth every 8 (eight) hours as needed for nausea or vomiting.   1  . potassium chloride SA (K-DUR,KLOR-CON) 20 MEQ tablet Take 20 mEq by mouth 2 (two) times daily.     . Marland Kitchenaccharomyces boulardii (FLORASTOR) 250 MG capsule Take 250 mg by mouth 2 (two) times daily.     . simvastatin (ZOCOR) 20 MG tablet Take 20 mg by mouth at bedtime.    . traMADol (ULTRAM) 50 MG tablet Take 1-2 tablets (50-100 mg total) by mouth every 6 (six) hours as needed. 35 tablet 0  . valACYclovir (VALTREX) 500 MG tablet Take 500 mg by mouth See admin instructions. For fever blisters take [id number] a day.  1  .  valsartan (DIOVAN) 160 MG tablet Take 160 mg by mouth every morning.      No current facility-administered medications on file prior to visit.    Objective:     Vitals:   08/18/19 1425  BP: (!) 149/69  Pulse: 73              Physical examination   Pelvic:   Vulva: Normal appearance.  No lesions.  Appearance consistent with lichen sclerosis. Some loss of architecture around the clitoral hood and the posterior fourchette  Vagina: No lesions or abnormalities noted. Significant atrophy  Support: Normal pelvic support.  Urethra No masses tenderness or scarring.  Meatus Normal size without lesions or prolapse.     Anus: Normal exam.  No lesions.  Perineum: Normal exam.  No lesions.    UA: No hematuria noted otherwise negative.  Assessment:    [id number] Patient Active Problem List   Diagnosis Date Noted  . Hav (hallux abducto valgus), unspecified laterality 06/03/2019  . Acquired bilateral hammer toes 06/03/2019  . Iron deficiency 04/26/2019  . Right-sided chest wall pain 04/12/2019  . Mid back pain on right side 04/12/2019  . 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 Recurrent UTI (urinary tract infection) 07/23/2017  . Nausea vomiting and diarrhea 01/11/2017  . Hypothyroidism 01/11/2017  . HTN (hypertension) 01/11/2017  . Diabetes ([place]) 01/11/2017  . GERD (gastroesophageal reflux disease) 01/11/2017  . Other pancytopenia ([place]) 11/14/2016  . 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 Status post hysterectomy 06/20/2016  . 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 Vaginal atrophy 01/12/2016  . 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 Hematuria, unspecified type   2. Lichen sclerosus   3. Atrophic vaginitis     Likely atrophic vaginitis resulting in small amount of blood the patient notes after urination.   Plan:            1. We discussed atrophic vaginitis and lichen sclerosis in some detail. I have advised her to begin using the clobetasol again especially anterior and posterior to the vagina and on the labia.  2. We'll begin estrogen vaginal treatment to help with atrophic vaginitis and I expect her bleeding to rapidly resolve.  Orders Orders Placed This  Encounter  Procedures  . POC Urinalysis Dipstick OB     Meds ordered this encounter  Medications  . clobetasol cream (TEMOVATE) 0.05 %    Sig: Apply 1 application topically 2 (two) times a week. Apply topically to affected areas as directed 2 times per week.    Dispense:  12 g    Refill:  1  . estradiol (ESTRACE) 0.1 MG/GM vaginal cream    Sig: Place 7.35 Applicatorfuls vaginally 2 (two) times a week.    Dispense:  42.5 g    Refill:  2      F/U  No follow-ups on file. I spent 23 minutes involved in the care of this patient preparing to see the patient by obtaining and reviewing her medical history (including labs, imaging tests and prior procedures), documenting clinical information in the electronic health record (EHR), counseling and coordinating care plans, writing and sending prescriptions, ordering tests or procedures and directly communicating with the patient by discussing pertinent items from her history and physical exam as well as detailing my assessment and plan as noted above so that she has an informed understanding.  All of her questions were answered.  Finis Bud, M.D. 08/18/2019 3:01 PM

## 2019-08-25 ENCOUNTER — Ambulatory Visit (INDEPENDENT_AMBULATORY_CARE_PROVIDER_SITE_OTHER): Payer: Medicare PPO | Admitting: Orthotics

## 2019-08-25 ENCOUNTER — Other Ambulatory Visit: Payer: Self-pay

## 2019-08-25 DIAGNOSIS — M79674 Pain in right toe(s): Secondary | ICD-10-CM

## 2019-08-25 DIAGNOSIS — E1142 Type 2 diabetes mellitus with diabetic polyneuropathy: Secondary | ICD-10-CM | POA: Diagnosis not present

## 2019-08-25 DIAGNOSIS — B351 Tinea unguium: Secondary | ICD-10-CM

## 2019-08-25 DIAGNOSIS — M79675 Pain in left toe(s): Secondary | ICD-10-CM

## 2019-08-25 DIAGNOSIS — M2010 Hallux valgus (acquired), unspecified foot: Secondary | ICD-10-CM

## 2019-08-25 DIAGNOSIS — M2041 Other hammer toe(s) (acquired), right foot: Secondary | ICD-10-CM

## 2019-08-25 DIAGNOSIS — M2042 Other hammer toe(s) (acquired), left foot: Secondary | ICD-10-CM

## 2019-08-25 NOTE — Progress Notes (Signed)

## 2019-09-01 ENCOUNTER — Ambulatory Visit: Payer: Medicare PPO | Admitting: Orthotics

## 2019-09-06 ENCOUNTER — Encounter: Payer: Self-pay | Admitting: Podiatry

## 2019-09-06 ENCOUNTER — Other Ambulatory Visit: Payer: Self-pay

## 2019-09-06 ENCOUNTER — Ambulatory Visit (INDEPENDENT_AMBULATORY_CARE_PROVIDER_SITE_OTHER): Payer: Medicare PPO | Admitting: Podiatry

## 2019-09-06 DIAGNOSIS — B351 Tinea unguium: Secondary | ICD-10-CM

## 2019-09-06 DIAGNOSIS — M2010 Hallux valgus (acquired), unspecified foot: Secondary | ICD-10-CM

## 2019-09-06 DIAGNOSIS — M79675 Pain in left toe(s): Secondary | ICD-10-CM | POA: Diagnosis not present

## 2019-09-06 DIAGNOSIS — M79674 Pain in right toe(s): Secondary | ICD-10-CM

## 2019-09-06 DIAGNOSIS — E1142 Type 2 diabetes mellitus with diabetic polyneuropathy: Secondary | ICD-10-CM

## 2019-09-06 NOTE — Progress Notes (Signed)
This patient returns to my office for at risk foot care.  This patient requires this care by a professional since this patient will be at risk due to having diabetes  This patient is unable to cut nails himself since the patient cannot reach his nails.These nails are painful walking and wearing shoes.  This patient presents for at risk foot care today.    General Appearance  Alert, conversant and in no acute stress.  Vascular  Dorsalis pedis and posterior tibial  pulses are palpable  bilaterally.  Capillary return is within normal limits  bilaterally. Temperature is within normal limits  bilaterally.  Neurologic  Senn-Weinstein monofilament test dimininished  bilaterally. Muscle power within normal limits bilaterally.  Nails Thick disfigured discolored nails with subungual debris  from hallux to fifth toes bilaterally. No evidence of bacterial infection or drainage bilaterally.  Orthopedic  No limitations of motion  feet .  No crepitus or effusions noted.  No bony pathology or digital deformities noted. HAV  B/L.  Hammer toes  B/L.  Midfoot DJD  B/L.  Skin  normotropic skin with no porokeratosis noted bilaterally.  No signs of infections or ulcers noted.     Onychomycosis  Pain in right toes  Pain in left toes  Consent was obtained for treatment procedures.   Mechanical debridement of nails 1-5  bilaterally performed with a nail nipper.  Filed with dremel without incident.     Return office visit    10 weeks                 Told patient to return for periodic foot care and evaluation due to potential at risk complications.   Gardiner Barefoot DPM

## 2019-09-30 ENCOUNTER — Other Ambulatory Visit: Payer: Self-pay

## 2019-09-30 ENCOUNTER — Encounter: Payer: Self-pay | Admitting: Obstetrics and Gynecology

## 2019-09-30 ENCOUNTER — Ambulatory Visit (INDEPENDENT_AMBULATORY_CARE_PROVIDER_SITE_OTHER): Payer: Medicare PPO | Admitting: Obstetrics and Gynecology

## 2019-09-30 VITALS — BP 120/66 | HR 60 | Ht 63.0 in | Wt 163.0 lb

## 2019-09-30 DIAGNOSIS — R300 Dysuria: Secondary | ICD-10-CM | POA: Diagnosis not present

## 2019-09-30 DIAGNOSIS — L900 Lichen sclerosus et atrophicus: Secondary | ICD-10-CM

## 2019-09-30 LAB — POCT URINALYSIS DIPSTICK
Bilirubin, UA: NEGATIVE
Blood, UA: NEGATIVE
Glucose, UA: POSITIVE — AB
Ketones, UA: NEGATIVE
Leukocytes, UA: NEGATIVE
Nitrite, UA: NEGATIVE
Protein, UA: NEGATIVE
Spec Grav, UA: 1.01 (ref 1.010–1.025)
Urobilinogen, UA: 0.2 E.U./dL
pH, UA: 6 (ref 5.0–8.0)

## 2019-09-30 NOTE — Progress Notes (Signed)
HPI:      [name] is a 79 y.o. [phone] who LMP was No LMP recorded (lmp unknown). Patient has had a hysterectomy.  Subjective:   She presents today stating that she continues to experience significant vulvar discomfort/pain associated with the anterior aspect of her labia.  She says there is a small "cut" there that is the most irritating.  She is using clobetasol twice weekly for lichen sclerosus.  The pain that she experiences with urination is only when the urine touches the labia. She does continue to use intravaginal estrogen cream and has had no vaginal bleeding.    Hx: The following portions of the patient's history were reviewed and updated as appropriate:             She  has a past medical history of Abdominal pain, Allergy, Cancer ([place]) (2017), Cataract, CHF (congestive heart failure) ([place]), Collagenous colitis, Diabetes mellitus without complication ([place]), Diarrhea, Diverticulosis, Fatty liver disease, nonalcoholic, Fibrocystic breast, GERD (gastroesophageal reflux disease), Heart murmur, Hyperlipidemia, Hypertension, Hypothyroidism, IBS (irritable bowel syndrome), IBS (irritable bowel syndrome), IDA (iron deficiency anemia), Personal history of radiation therapy (2017), PONV (postoperative nausea and vomiting), and Sleep apnea. She does not have any pertinent problems on file. She  has a past surgical history that includes Cardiac surgery; Abdominal surgery; Cholecystectomy; left sinusplasty ; Joint replacement (Left); Cataract extraction (Right); Partial mastectomy with needle localization (Left, 09/29/2015); Mastectomy, partial (Left, 10/17/2015); Total abdominal hysterectomy w/ bilateral salpingoophorectomy; Appendectomy; Colonoscopy with propofol (N/A, 03/11/2016); Esophagogastroduodenoscopy (egd) with propofol (N/A, 03/11/2016); Abdominal hysterectomy; Hardware Removal (Right, 09/03/2018); Ganglion cyst excision (Right, 09/03/2018); Finger arthroscopy with carpometacarpel(cmc)  arthroplasty (Right, 09/03/2018); Breast biopsy (Bilateral, 2016); Breast biopsy (Left, 09/11/2015); Breast biopsy (Left, 05/27/2016); Breast biopsy (Left, 11/20/2017); and Breast lumpectomy (Left, 10/17/2015). Her family history includes Bladder Cancer in her brother; Breast cancer in her paternal grandmother; Colon cancer in her father and maternal uncle; Diabetes in her brother and sister; Heart disease in her brother; Leukemia in her mother; Prostate cancer in her brother and brother. She  reports that she has never smoked. She has never used smokeless tobacco. She reports that she does not drink alcohol and does not use drugs. She has a current medication list which includes the following prescription(s): aspirin, benzonatate, budesonide-formoterol, vitamin d, clobetasol cream, cyanocobalamin, cyanocobalamin, desloratadine, dicyclomine, epinephrine, escitalopram, esomeprazole, estradiol, fenofibrate, fluticasone, furosemide, gabapentin, glimepiride, hyoscyamine sulfate, insulin degludec, levothyroxine, magnesium oxide, meloxicam, metformin, novolog flexpen, olopatadine, ondansetron, potassium chloride sa, saccharomyces boulardii, simvastatin, valacyclovir, valsartan, and humalog kwikpen. She is allergic to codeine, demeclocycline, demerol [meperidine], hydrocodone, oxycodone, pentazocine, tetracyclines & related, and fentanyl.       Review of Systems:  Review of Systems  Constitutional: Denied constitutional symptoms, night sweats, recent illness, fatigue, fever, insomnia and weight loss.  Eyes: Denied eye symptoms, eye pain, photophobia, vision change and visual disturbance.  Ears/Nose/Throat/Neck: Denied ear, nose, throat or neck symptoms, hearing loss, nasal discharge, sinus congestion and sore throat.  Cardiovascular: Denied cardiovascular symptoms, arrhythmia, chest pain/pressure, edema, exercise intolerance, orthopnea and palpitations.  Respiratory: Denied pulmonary symptoms, asthma, pleuritic  pain, productive sputum, cough, dyspnea and wheezing.  Gastrointestinal: Denied, gastro-esophageal reflux, melena, nausea and vomiting.  Genitourinary: See HPI for additional information.  Musculoskeletal: Denied musculoskeletal symptoms, stiffness, swelling, muscle weakness and myalgia.  Dermatologic: Denied dermatology symptoms, rash and scar.  Neurologic: Denied neurology symptoms, dizziness, headache, neck pain and syncope.  Psychiatric: Denied psychiatric symptoms, anxiety and depression.  Endocrine: Denied endocrine symptoms including hot flashes and  night sweats.   Meds:   Current Outpatient Medications on File Prior to Visit  Medication Sig Dispense Refill  . aspirin 81 MG tablet Take 81 mg by mouth at bedtime.    . benzonatate (TESSALON) 200 MG capsule Take 200 mg by mouth 3 (three) times daily as needed for cough.    . budesonide-formoterol (SYMBICORT) 80-4.5 MCG/ACT inhaler Inhale 2 puffs into the lungs 2 (two) times daily as needed (shortness of breath).     . Cholecalciferol (VITAMIN D) 50 MCG (2000 UT) CAPS Take 2,000 Units by mouth daily.    . clobetasol cream (TEMOVATE) 5.17 % Apply 1 application topically 2 (two) times a week. Apply topically to affected areas as directed 2 times per week. 12 g 1  . cyanocobalamin (,VITAMIN B-12,) 1000 MCG/ML injection     . Cyanocobalamin (B-12 COMPLIANCE INJECTION) 1000 MCG/ML KIT Inject 1,000 mcg as directed every 30 (thirty) days.     Marland Kitchen desloratadine (CLARINEX) 5 MG tablet Take 5 mg by mouth daily.    Marland Kitchen dicyclomine (BENTYL) 10 MG capsule     . EPINEPHrine 0.3 mg/0.3 mL IJ SOAJ injection     . escitalopram (LEXAPRO) 5 MG tablet Take 5 mg by mouth at bedtime.    Marland Kitchen esomeprazole (NEXIUM) 40 MG capsule Take 40 mg by mouth 2 (two) times daily before a meal.     . estradiol (ESTRACE) 0.1 MG/GM vaginal cream Place 0.01 Applicatorfuls vaginally 2 (two) times a week. 42.5 g 2  . fenofibrate 160 MG tablet Take 160 mg by mouth at bedtime.    .  fluticasone (FLONASE) 50 MCG/ACT nasal spray Place 1 spray into the nose daily as needed for allergies.     . furosemide (LASIX) 20 MG tablet Take 20-40 mg by mouth See admin instructions. Take once a day. alternate taking 8m and 478m    . gabapentin (NEURONTIN) 300 MG capsule Take 300 mg by mouth at bedtime.     . Marland Kitchenlimepiride (AMARYL) 2 MG tablet Take 2 mg by mouth at bedtime.    . Marland Kitchenyoscyamine Sulfate 0.375 MG TBCR Take 0.375 mg by mouth 2 (two) times daily.     . Insulin Degludec (TRESIBA FLEXTOUCH [place]) Inject 44 Units into the skin at bedtime.     . Marland Kitchenevothyroxine (SYNTHROID, LEVOTHROID) 75 MCG tablet Take 75 mcg by mouth daily before breakfast.     . magnesium oxide (MAG-OX) 400 MG tablet Take 400 mg by mouth 2 (two) times daily.     . meloxicam (MOBIC) 15 MG tablet Take 15 mg by mouth daily.    . metFORMIN (GLUCOPHAGE-XR) 750 MG 24 hr tablet Take 750 mg by mouth 2 (two) times daily.    . Marland KitchenOVOLOG FLEXPEN 100 UNIT/ML FlexPen     . olopatadine (PATANOL) 0.1 % ophthalmic solution Place 1 drop into both eyes 2 (two) times daily as needed for allergies.     . Marland Kitchenndansetron (ZOFRAN) 4 MG tablet Take 4 mg by mouth every 8 (eight) hours as needed for nausea or vomiting.   1  . potassium chloride SA (K-DUR,KLOR-CON) 20 MEQ tablet Take 20 mEq by mouth 2 (two) times daily.     . Marland Kitchenaccharomyces boulardii (FLORASTOR) 250 MG capsule Take 250 mg by mouth 2 (two) times daily.     . simvastatin (ZOCOR) 20 MG tablet Take 20 mg by mouth at bedtime.    . valACYclovir (VALTREX) 500 MG tablet Take 500 mg by mouth See admin instructions. For fever blisters  take 561m twice a day.  1  . valsartan (DIOVAN) 160 MG tablet Take 160 mg by mouth every morning.     .Marland KitchenHUMALOG KWIKPEN 100 UNIT/ML KwikPen Inject 8 Units into the skin 3 (three) times daily. Before meals (Patient not taking: Reported on 09/30/2019)     No current facility-administered medications on file prior to visit.    Objective:     Vitals:   09/30/19  0907  BP: 120/66  Pulse: 60              Examination of the vulva reveals significant vulvar irritation consistent with lichen sclerosis.  Anteriorly there is a small skin break that she describes as the most painful.  She has also lost architecture anteriorly also usually associated with lichen sclerosis.  Assessment:    [id number] Active Problem List   Diagnosis Date Noted  . Hav (hallux abducto valgus), unspecified laterality 06/03/2019  . Acquired bilateral hammer toes 06/03/2019  . Iron deficiency 04/26/2019  . Right-sided chest wall pain 04/12/2019  . Mid back pain on right side 04/12/2019  . 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 Recurrent UTI (urinary tract infection) 07/23/2017  . Nausea vomiting and diarrhea 01/11/2017  . Hypothyroidism 01/11/2017  . HTN (hypertension) 01/11/2017  . Diabetes ([place] 01/11/2017  . GERD (gastroesophageal reflux disease) 01/11/2017  . [place]top[place] 11/14/2016  . 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 Status post hysterectomy 06/20/2016  . 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 Vaginal atrophy 01/12/2016  . 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 Dysuria   2. Lichen sclerosus     Exacerbation of lichen sclerosus and chronic vulvar irritation with noted " cut" area.   Plan:            1.  Increased use of clobetasol to twice weekly especially to irritated areas.  2.  If "cut" area does not improve in the next 2 weeks patient to call and we will consider use of triple antibiotic cream on this area. Orders Orders Placed This Encounter  Procedures  . POCT urinalysis dipstick    No orders of the defined types were placed in this encounter.     F/U  No follow-ups on file. I spent 22 minutes involved in the care of this patient preparing to see the patient by obtaining and reviewing her medical history  (including labs, imaging tests and prior procedures), documenting clinical information in the electronic health record (EHR), counseling and coordinating care plans, writing and sending prescriptions, ordering tests or procedures and directly communicating with the patient by discussing pertinent items from her history and physical exam as well as detailing my assessment and plan as noted above so that she has an informed understanding.  All of her questions were answered.  DFinis Bud M.D. 09/30/2019 9:41 AM

## 2019-10-19 ENCOUNTER — Inpatient Hospital Stay (HOSPITAL_BASED_OUTPATIENT_CLINIC_OR_DEPARTMENT_OTHER): Payer: Medicare PPO | Admitting: Internal Medicine

## 2019-10-19 ENCOUNTER — Encounter: Payer: Self-pay | Admitting: Internal Medicine

## 2019-10-19 ENCOUNTER — Inpatient Hospital Stay: Payer: Medicare PPO

## 2019-10-19 ENCOUNTER — Inpatient Hospital Stay: Payer: Medicare PPO | Attending: Internal Medicine

## 2019-10-19 ENCOUNTER — Other Ambulatory Visit: Payer: Self-pay

## 2019-10-19 VITALS — BP 118/60 | HR 68 | Temp 97.6°F | Resp 18

## 2019-10-19 DIAGNOSIS — D0512 Intraductal carcinoma in situ of left breast: Secondary | ICD-10-CM

## 2019-10-19 DIAGNOSIS — Z794 Long term (current) use of insulin: Secondary | ICD-10-CM | POA: Insufficient documentation

## 2019-10-19 DIAGNOSIS — Z800 Family history of malignant neoplasm of digestive organs: Secondary | ICD-10-CM | POA: Diagnosis not present

## 2019-10-19 DIAGNOSIS — Z86000 Personal history of in-situ neoplasm of breast: Secondary | ICD-10-CM | POA: Insufficient documentation

## 2019-10-19 DIAGNOSIS — D6959 Other secondary thrombocytopenia: Secondary | ICD-10-CM | POA: Insufficient documentation

## 2019-10-19 DIAGNOSIS — K766 Portal hypertension: Secondary | ICD-10-CM | POA: Diagnosis not present

## 2019-10-19 DIAGNOSIS — E119 Type 2 diabetes mellitus without complications: Secondary | ICD-10-CM | POA: Diagnosis not present

## 2019-10-19 DIAGNOSIS — K7460 Unspecified cirrhosis of liver: Secondary | ICD-10-CM | POA: Diagnosis not present

## 2019-10-19 DIAGNOSIS — Z923 Personal history of irradiation: Secondary | ICD-10-CM | POA: Diagnosis not present

## 2019-10-19 DIAGNOSIS — Z8052 Family history of malignant neoplasm of bladder: Secondary | ICD-10-CM | POA: Insufficient documentation

## 2019-10-19 DIAGNOSIS — E611 Iron deficiency: Secondary | ICD-10-CM

## 2019-10-19 DIAGNOSIS — D649 Anemia, unspecified: Secondary | ICD-10-CM | POA: Insufficient documentation

## 2019-10-19 DIAGNOSIS — D61818 Other pancytopenia: Secondary | ICD-10-CM | POA: Diagnosis not present

## 2019-10-19 DIAGNOSIS — Z8042 Family history of malignant neoplasm of prostate: Secondary | ICD-10-CM | POA: Diagnosis not present

## 2019-10-19 DIAGNOSIS — Z806 Family history of leukemia: Secondary | ICD-10-CM | POA: Diagnosis not present

## 2019-10-19 DIAGNOSIS — K7581 Nonalcoholic steatohepatitis (NASH): Secondary | ICD-10-CM | POA: Insufficient documentation

## 2019-10-19 LAB — CBC WITH DIFFERENTIAL/PLATELET
Abs Immature Granulocytes: 0.01 10*3/uL (ref 0.00–0.07)
Basophils Absolute: 0 10*3/uL (ref 0.0–0.1)
Basophils Relative: 1 %
Eosinophils Absolute: 0.1 10*3/uL (ref 0.0–0.5)
Eosinophils Relative: 4 %
HCT: 33.9 % — ABNORMAL LOW (ref 36.0–46.0)
Hemoglobin: 11.7 g/dL — ABNORMAL LOW (ref 12.0–15.0)
Immature Granulocytes: 1 %
Lymphocytes Relative: 40 %
Lymphs Abs: 0.8 10*3/uL (ref 0.7–4.0)
MCH: 30.5 pg (ref 26.0–34.0)
MCHC: 34.5 g/dL (ref 30.0–36.0)
MCV: 88.3 fL (ref 80.0–100.0)
Monocytes Absolute: 0.2 10*3/uL (ref 0.1–1.0)
Monocytes Relative: 11 %
Neutro Abs: 0.9 10*3/uL — ABNORMAL LOW (ref 1.7–7.7)
Neutrophils Relative %: 43 %
Platelets: 77 10*3/uL — ABNORMAL LOW (ref 150–400)
RBC: 3.84 MIL/uL — ABNORMAL LOW (ref 3.87–5.11)
RDW: 13.3 % (ref 11.5–15.5)
WBC: 1.9 10*3/uL — ABNORMAL LOW (ref 4.0–10.5)
nRBC: 0 % (ref 0.0–0.2)

## 2019-10-19 LAB — COMPREHENSIVE METABOLIC PANEL
ALT: 22 U/L (ref 0–44)
AST: 31 U/L (ref 15–41)
Albumin: 3.9 g/dL (ref 3.5–5.0)
Alkaline Phosphatase: 53 U/L (ref 38–126)
Anion gap: 10 (ref 5–15)
BUN: 15 mg/dL (ref 8–23)
CO2: 28 mmol/L (ref 22–32)
Calcium: 8.7 mg/dL — ABNORMAL LOW (ref 8.9–10.3)
Chloride: 97 mmol/L — ABNORMAL LOW (ref 98–111)
Creatinine, Ser: 0.71 mg/dL (ref 0.44–1.00)
GFR calc Af Amer: >60 mL/min (ref >60)
GFR calc non Af Amer: >60 mL/min (ref >60)
Glucose, Bld: 268 mg/dL — ABNORMAL HIGH (ref 70–99)
Potassium: 4 mmol/L (ref 3.5–5.1)
Sodium: 135 mmol/L (ref 135–145)
Total Bilirubin: 0.9 mg/dL (ref 0.3–1.2)
Total Protein: 8 g/dL (ref 6.5–8.1)

## 2019-10-19 MED ORDER — IRON SUCROSE 20 MG/ML IV SOLN
200.0000 mg | Freq: Once | INTRAVENOUS | Status: AC
  Administered 2019-10-19: 200 mg via INTRAVENOUS
  Filled 2019-10-19: qty 10

## 2019-10-19 MED ORDER — SODIUM CHLORIDE 0.9 % IV SOLN
Freq: Once | INTRAVENOUS | Status: AC
  Filled 2019-10-19: qty 250

## 2019-10-19 NOTE — Progress Notes (Signed)
Pt completed IV Venofer treatment today. Vitals stable. No c/o from patient. Pt d/c'd. All questions answered.

## 2019-10-19 NOTE — Progress Notes (Signed)
[place] OFFICE PROGRESS NOTE  Patient Care Team: Albina Billet, MD as PCP - General (Internal Medicine)  Cancer Staging No matching staging information was found for the patient.   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 OCT 2018- BMBx- MILD dyspoiesis; variable cellularity [10-50%]; FISH/Cytogenetics-NORMAL; F-One-NGS-declined by insurance.    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 Brandon/uro-gyne,[facility]]  # HRT [clinical trial thru [place]; stopped July 2017 ]; CPAP   DIAGNOSIS: Left breast DCIS  STAGE:    0     ;GOALS: cure  CURRENT/MOST RECENT THERAPY: surveillaince    Ductal carcinoma in situ (DCIS) of left breast    INTERVAL HISTORY:  Annette Foster 79 y.o.  female pleasant patient above history of DCIS; cirrhosis portal hypertension is here for follow-up.  Patient has not had any recent hospitalizations.  No more recent UTIs.  No sinus infections or pneumonias.  No blood in stools or black or stools.  No abdominal distention swelling of the legs.  Review of Systems  Constitutional: Positive for malaise/fatigue. Negative for chills, diaphoresis, fever and weight loss.  HENT: Negative for nosebleeds and sore throat.   Eyes: Negative for double vision.  Respiratory: Negative for cough, hemoptysis, sputum production, shortness of breath and wheezing.   Cardiovascular: Negative for chest pain, palpitations, orthopnea and leg swelling.  Gastrointestinal: Negative for abdominal pain, blood in stool, constipation, diarrhea, heartburn, melena, nausea and vomiting.  Musculoskeletal: Positive for  back pain and joint pain.  Skin: Negative.  Negative for itching and rash.  Neurological: Negative for dizziness, tingling, focal weakness, weakness and headaches.  Endo/Heme/Allergies: Does not bruise/bleed easily.  Psychiatric/Behavioral: Negative for depression. The patient is not nervous/anxious and does not have insomnia.       PAST MEDICAL HISTORY :  Past Medical History:  Diagnosis Date  . Abdominal pain   . Allergy   . Cancer [facility]) 2017   breast cancer- Left  . Cataract   . CHF (congestive heart failure) ([place])   . Collagenous colitis   . Diabetes mellitus without complication ([place])   . Diarrhea   . Diverticulosis   . Fatty liver disease, nonalcoholic   . Fibrocystic breast   . GERD (gastroesophageal reflux disease)   . Heart murmur   . Hyperlipidemia   . Hypertension   . Hypothyroidism   . IBS (irritable bowel syndrome)   . IBS (irritable bowel syndrome)   . IDA (iron deficiency anemia)   . Personal history of radiation therapy 2017   LEFT BREAST CA  . PONV (postoperative nausea and vomiting)   . Sleep apnea    C-Pap    PAST SURGICAL HISTORY :   Past Surgical History:  Procedure Laterality Date  . ABDOMINAL HYSTERECTOMY     [place] bso  . ABDOMINAL SURGERY    . APPENDECTOMY    . BREAST BIOPSY Bilateral 2016   negative  . 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 BREAST BIOPSY Left 05/27/2016   BENIGN MAMMARY EPITHELIUM  . BREAST BIOPSY Left 11/20/2017   affirm bx x clip BENIGN MAMMARY EPITHELIUM CONSISTENT WITH RAD THERAPY  . BREAST LUMPECTOMY Left 10/17/2015  DCIS and papillary carcinoma insitu, clear margins  . CARDIAC SURGERY     has replacement valve  . CATARACT EXTRACTION Right   . CHOLECYSTECTOMY    . COLONOSCOPY WITH PROPOFOL N/A 03/11/2016   Procedure: COLONOSCOPY WITH PROPOFOL;  Surgeon: Manya Silvas, MD;  Location: [facility] ENDOSCOPY;  Service: Endoscopy;  Laterality: N/A;  . ESOPHAGOGASTRODUODENOSCOPY (EGD) WITH  PROPOFOL N/A 03/11/2016   Procedure: ESOPHAGOGASTRODUODENOSCOPY (EGD) WITH PROPOFOL;  Surgeon: Manya Silvas, MD;  Location: [facility] ENDOSCOPY;  Service: Endoscopy;  Laterality: N/A;  . FINGER ARTHROSCOPY WITH CARPOMETACARPEL (Belt) ARTHROPLASTY Right 09/03/2018   Procedure: CARPOMETACARPEL [facility]) ARTHROPLASTY RIGHT THUMB;  Surgeon: Hessie Knows, MD;  Location: [facility] ORS;  Service: Orthopedics;  Laterality: Right;  . GANGLION CYST EXCISION Right 09/03/2018   Procedure: REMOVAL GANGLION OF WRIST;  Surgeon: Hessie Knows, MD;  Location: [facility] ORS;  Service: Orthopedics;  Laterality: Right;  . HARDWARE REMOVAL Right 09/03/2018   Procedure: HARDWARE REMOVAL RIGHT THUMB;  Surgeon: Hessie Knows, MD;  Location: [facility] ORS;  Service: Orthopedics;  Laterality: Right;  staple removed  . JOINT REPLACEMENT Left    TKR  . left sinusplasty     . MASTECTOMY, PARTIAL Left 10/17/2015   Procedure: MASTECTOMY PARTIAL REVISION;  Surgeon: Leonie Green, MD;  Location: [facility] ORS;  Service: General;  Laterality: Left;  . PARTIAL MASTECTOMY WITH NEEDLE LOCALIZATION Left 09/29/2015   Procedure: PARTIAL MASTECTOMY WITH NEEDLE LOCALIZATION;  Surgeon: Leonie Green, MD;  Location: [facility] ORS;  Service: General;  Laterality: Left;  . TOTAL ABDOMINAL HYSTERECTOMY W/ BILATERAL SALPINGOOPHORECTOMY      FAMILY HISTORY :   Family History  Problem Relation Age of Onset  . Breast cancer Paternal Grandmother   . Colon cancer Father   . Diabetes Sister   . Diabetes Brother   . Heart disease Brother   . Prostate cancer Brother   . Colon cancer Maternal Uncle   . Prostate cancer Brother   . Bladder Cancer Brother   . Leukemia Mother        all  . Ovarian cancer Neg Hx   . Kidney cancer Neg Hx     SOCIAL HISTORY:   Social History   Tobacco Use  . Smoking status: Never Smoker  . Smokeless tobacco: Never Used  Vaping Use  . Vaping Use: Never used  Substance Use Topics  . Alcohol use: No    Alcohol/week: 0.0  standard drinks  . Drug use: No    ALLERGIES:  is allergic to codeine, demeclocycline, demerol [meperidine], hydrocodone, oxycodone, pentazocine, tetracyclines & related, and fentanyl.  MEDICATIONS:  Current Outpatient Medications  Medication Sig Dispense Refill  . aspirin 81 MG tablet Take 81 mg by mouth at bedtime.    . benzonatate (TESSALON) 200 MG capsule Take 200 mg by mouth 3 (three) times daily as needed for cough.    . budesonide-formoterol (SYMBICORT) 80-4.5 MCG/ACT inhaler Inhale 2 puffs into the lungs 2 (two) times daily as needed (shortness of breath).     . Cholecalciferol (VITAMIN D) 50 MCG (2000 UT) CAPS Take 2,000 Units by mouth daily.    . clobetasol cream (TEMOVATE) 3.87 % Apply 1 application topically 2 (two) times a week. Apply topically to affected areas as directed 2 times per week. 12 g 1  . cyanocobalamin (,VITAMIN B-12,) 1000 MCG/ML injection     . Cyanocobalamin (B-12 COMPLIANCE INJECTION) 1000 MCG/ML KIT Inject 1,000 mcg as directed every 30 (thirty) days.     Marland Kitchen desloratadine (CLARINEX) 5  MG tablet Take 5 mg by mouth daily.    Marland Kitchen dicyclomine (BENTYL) 10 MG capsule Take 10 mg by mouth at bedtime.     Marland Kitchen escitalopram (LEXAPRO) 5 MG tablet Take 5 mg by mouth at bedtime.    Marland Kitchen esomeprazole (NEXIUM) 40 MG capsule Take 40 mg by mouth 2 (two) times daily before a meal.     . estradiol (ESTRACE) 0.1 MG/GM vaginal cream Place 7.07 Applicatorfuls vaginally 2 (two) times a week. 42.5 g 2  . fenofibrate 160 MG tablet Take 160 mg by mouth at bedtime.    . furosemide (LASIX) 20 MG tablet Take 20-40 mg by mouth See admin instructions. Take once a day. alternate taking 9m and 464m    . gabapentin (NEURONTIN) 300 MG capsule Take 300 mg by mouth at bedtime.     . Marland Kitchenlimepiride (AMARYL) 2 MG tablet Take 2 mg by mouth at bedtime.    . Marland Kitchenyoscyamine Sulfate 0.375 MG TBCR Take 0.375 mg by mouth 2 (two) times daily.     . Insulin Degludec (TRESIBA FLEXTOUCH [place]) Inject 44 Units into the skin  at bedtime.     . Marland Kitchenevothyroxine (SYNTHROID, LEVOTHROID) 75 MCG tablet Take 75 mcg by mouth daily before breakfast.     . magnesium oxide (MAG-OX) 400 MG tablet Take 400 mg by mouth 2 (two) times daily.     . metFORMIN (GLUCOPHAGE-XR) 750 MG 24 hr tablet Take 750 mg by mouth 2 (two) times daily.    . Marland KitchenOVOLOG FLEXPEN 100 UNIT/ML FlexPen Based on sliding scale    . olopatadine (PATANOL) 0.1 % ophthalmic solution Place 1 drop into both eyes 2 (two) times daily as needed for allergies.     . potassium chloride SA (K-DUR,KLOR-CON) 20 MEQ tablet Take 20 mEq by mouth 2 (two) times daily.     . Marland Kitchenaccharomyces boulardii (FLORASTOR) 250 MG capsule Take 250 mg by mouth 2 (two) times daily.     . simvastatin (ZOCOR) 20 MG tablet Take 20 mg by mouth at bedtime.    . valACYclovir (VALTREX) 500 MG tablet Take 500 mg by mouth See admin instructions. For fever blisters take 50040mwice a day.  1  . valsartan (DIOVAN) 160 MG tablet Take 160 mg by mouth every morning.     . EMarland KitchenINEPHrine 0.3 mg/0.3 mL IJ SOAJ injection  (Patient not taking: Reported on 10/19/2019)    . fluticasone (FLONASE) 50 MCG/ACT nasal spray Place 1 spray into the nose daily as needed for allergies.  (Patient not taking: Reported on 10/19/2019)    . ondansetron (ZOFRAN) 4 MG tablet Take 4 mg by mouth every 8 (eight) hours as needed for nausea or vomiting.  (Patient not taking: Reported on 10/19/2019)  1   No current facility-administered medications for this visit.    PHYSICAL EXAMINATION: ECOG PERFORMANCE STATUS: 1 - Symptomatic but completely ambulatory  BP (!) 116/59   Pulse 65   Temp (!) 97.2 F (36.2 C) (Tympanic)   Resp 18   Ht 5' 3" (1.6 m)   Wt 163 lb 6.4 oz (74.1 kg)   LMP  (LMP Unknown)   BMI 28.95 kg/m   Filed Weights   10/19/19 1314  Weight: 163 lb 6.4 oz (74.1 kg)    Physical Exam HENT:     Head: Normocephalic and atraumatic.     Mouth/Throat:     Pharynx: No oropharyngeal exudate.  Eyes:     Pupils: Pupils are equal,  round, and reactive to light.  Cardiovascular:     Rate and Rhythm: Normal rate and regular rhythm.  Pulmonary:     Effort: No respiratory distress.     Breath sounds: No wheezing.  Abdominal:     General: Bowel sounds are normal. There is no distension.     Palpations: Abdomen is soft. There is no mass.     Tenderness: There is no abdominal tenderness. There is no guarding or rebound.  Musculoskeletal:        General: No tenderness. Normal range of motion.     Cervical back: Normal range of motion and neck supple.  Skin:    General: Skin is warm.     Comments: Right and left BREAST exam (in the presence of nurse)- no unusual skin changes or dominant masses felt. Surgical scars noted.   Neurological:     Mental Status: She is alert and oriented to person, place, and time.  Psychiatric:        Mood and Affect: Affect normal.      LABORATORY DATA:  I have reviewed the data as listed    Component Value Date/Time   NA 135 10/19/2019 1254   K 4.0 10/19/2019 1254   CL 97 (L) 10/19/2019 1254   CO2 28 10/19/2019 1254   GLUCOSE 268 (H) 10/19/2019 1254   BUN 15 10/19/2019 1254   CREATININE 0.71 10/19/2019 1254   CALCIUM 8.7 (L) 10/19/2019 1254   PROT 8.0 10/19/2019 1254   ALBUMIN 3.9 10/19/2019 1254   AST 31 10/19/2019 1254   ALT 22 10/19/2019 1254   ALKPHOS 53 10/19/2019 1254   BILITOT 0.9 10/19/2019 1254   GFRNONAA >60 10/19/2019 1254   GFRAA >60 10/19/2019 1254    No results found for: SPEP, UPEP  Lab Results  Component Value Date   WBC 1.9 (L) 10/19/2019   NEUTROABS 0.9 (L) 10/19/2019   HGB 11.7 (L) 10/19/2019   HCT 33.9 (L) 10/19/2019   MCV 88.3 10/19/2019   PLT 77 (L) 10/19/2019      Chemistry      Component Value Date/Time   NA 135 10/19/2019 1254   K 4.0 10/19/2019 1254   CL 97 (L) 10/19/2019 1254   CO2 28 10/19/2019 1254   BUN 15 10/19/2019 1254   CREATININE 0.71 10/19/2019 1254      Component Value Date/Time   CALCIUM 8.7 (L) 10/19/2019 1254    ALKPHOS 53 10/19/2019 1254   AST 31 10/19/2019 1254   ALT 22 10/19/2019 1254   BILITOT 0.9 10/19/2019 1254       RADIOGRAPHIC STUDIES: I have personally reviewed the radiological images as listed and agreed with the findings in the report. No results found.   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  STABLE; .  Mammogram December 2020-within normal limits. STABLE;Off AI because of intolerance.   # Anemia-worsening hemoglobin 11.4 s/p IV iron infusion-unclear etiology/underlying cirrhosis; IDA-Recommend IV iron.   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  ANC-0.9 Platelets 70s- STABLE.   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 Hall Busing   No orders of the defined types were placed in this encounter.  All questions were answered. The patient knows to call the clinic with any problems, questions or concerns.      Cammie Sickle,  MD 10/19/2019 1:50 PM

## 2019-10-19 NOTE — Assessment & Plan Note (Addendum)
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  STABLE; .  Mammogram December 2020-within normal limits. STABLE;Off AI because of intolerance.   # Anemia-stable.  Hemoglobin 11.4 s/p IV iron infusion-unclear etiology/underlying cirrhosis; IDA-Recommend IV iron.   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  ANC-0.9 Platelets 70s- STABLE.   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 Hall Busing

## 2019-11-15 ENCOUNTER — Ambulatory Visit (INDEPENDENT_AMBULATORY_CARE_PROVIDER_SITE_OTHER): Payer: Medicare PPO | Admitting: Podiatry

## 2019-11-15 ENCOUNTER — Other Ambulatory Visit: Payer: Self-pay

## 2019-11-15 ENCOUNTER — Encounter: Payer: Self-pay | Admitting: Podiatry

## 2019-11-15 DIAGNOSIS — B351 Tinea unguium: Secondary | ICD-10-CM | POA: Diagnosis not present

## 2019-11-15 DIAGNOSIS — M79675 Pain in left toe(s): Secondary | ICD-10-CM | POA: Diagnosis not present

## 2019-11-15 DIAGNOSIS — E1142 Type 2 diabetes mellitus with diabetic polyneuropathy: Secondary | ICD-10-CM | POA: Diagnosis not present

## 2019-11-15 DIAGNOSIS — M79674 Pain in right toe(s): Secondary | ICD-10-CM

## 2019-11-15 DIAGNOSIS — M2041 Other hammer toe(s) (acquired), right foot: Secondary | ICD-10-CM

## 2019-11-15 DIAGNOSIS — M2042 Other hammer toe(s) (acquired), left foot: Secondary | ICD-10-CM

## 2019-11-15 NOTE — Progress Notes (Signed)
This patient returns to my office for at risk foot care.  This patient requires this care by a professional since this patient will be at risk due to having diabetes  This patient is unable to cut nails himself since the patient cannot reach his nails.These nails are painful walking and wearing shoes.  This patient presents for at risk foot care today.    General Appearance  Alert, conversant and in no acute stress.  Vascular  Dorsalis pedis and posterior tibial  pulses are palpable  bilaterally.  Capillary return is within normal limits  bilaterally. Temperature is within normal limits  bilaterally.  Neurologic  Senn-Weinstein monofilament test dimininished  bilaterally. Muscle power within normal limits bilaterally.  Nails Thick disfigured discolored nails with subungual debris  from hallux to fifth toes bilaterally. No evidence of bacterial infection or drainage bilaterally.  Orthopedic  No limitations of motion  feet .  No crepitus or effusions noted.  No bony pathology or digital deformities noted. HAV  B/L.  Hammer toes  B/L.  Midfoot DJD  B/L.  Skin  normotropic skin with no porokeratosis noted bilaterally.  No signs of infections or ulcers noted.     Onychomycosis  Pain in right toes  Pain in left toes  Consent was obtained for treatment procedures.   Mechanical debridement of nails 1-5  bilaterally performed with a nail nipper.  Filed with dremel without incident.     Return office visit    10 weeks                 Told patient to return for periodic foot care and evaluation due to potential at risk complications.   Gardiner Barefoot DPM

## 2019-11-18 ENCOUNTER — Ambulatory Visit (INDEPENDENT_AMBULATORY_CARE_PROVIDER_SITE_OTHER): Payer: Medicare PPO | Admitting: Obstetrics and Gynecology

## 2019-11-18 ENCOUNTER — Encounter: Payer: Self-pay | Admitting: Obstetrics and Gynecology

## 2019-11-18 ENCOUNTER — Other Ambulatory Visit: Payer: Self-pay

## 2019-11-18 ENCOUNTER — Other Ambulatory Visit (HOSPITAL_COMMUNITY)
Admission: RE | Admit: 2019-11-18 | Discharge: 2019-11-18 | Disposition: A | Discharge status: Home / Self Care | Payer: Medicare PPO | Source: Ambulatory Visit | Attending: Obstetrics and Gynecology | Admitting: Obstetrics and Gynecology

## 2019-11-18 ENCOUNTER — Telehealth: Payer: Self-pay

## 2019-11-18 VITALS — BP 150/56 | HR 62 | Ht 63.0 in | Wt 166.4 lb

## 2019-11-18 DIAGNOSIS — L900 Lichen sclerosus et atrophicus: Secondary | ICD-10-CM | POA: Diagnosis present

## 2019-11-18 DIAGNOSIS — N763 Subacute and chronic vulvitis: Secondary | ICD-10-CM | POA: Diagnosis not present

## 2019-11-18 MED ORDER — FLUCONAZOLE 150 MG PO TABS: 150.0000 mg | ORAL_TABLET | ORAL | 0 refills | Status: DC

## 2019-11-18 MED ORDER — CLOBETASOL PROPIONATE 0.05 % EX OINT: 1.0000 "application " | TOPICAL_OINTMENT | CUTANEOUS | 1 refills | Status: DC

## 2019-11-18 MED ORDER — CLOBETASOL PROPIONATE 0.05 % EX OINT: TOPICAL_OINTMENT | CUTANEOUS | Status: DC

## 2019-11-18 NOTE — Addendum Note (Signed)
Addended by: Durwin Glaze on: 11/18/2019 11:52 AM   Modules accepted: Orders

## 2019-11-18 NOTE — Progress Notes (Signed)
HPI:      Annette Foster is a 79 y.o. [phone] who LMP was No LMP recorded (lmp unknown). Patient has had a hysterectomy.  Subjective:   She presents today continuing to have problems with burning and vulvar discomfort.  She is tearful because of the constant irritation. She has used clobetasol cream as directed with minimal success. Patient is diabetic and states that her sugars have been out of control and she has a future appointment with an endocrinologist.  Thought is that possibly her cirrhosis is the cause issues with her elevated glucose.    Hx: The following portions of the patient's history were reviewed and updated as appropriate:             She  has a past medical history of Abdominal pain, Allergy, Cancer ([place]) (2017), Cataract, CHF (congestive heart failure) ([place]), Collagenous colitis, Diabetes mellitus without complication ([place]), Diarrhea, Diverticulosis, Fatty liver disease, nonalcoholic, Fibrocystic breast, GERD (gastroesophageal reflux disease), Heart murmur, Hyperlipidemia, Hypertension, Hypothyroidism, IBS (irritable bowel syndrome), IBS (irritable bowel syndrome), IDA (iron deficiency anemia), Personal history of radiation therapy (2017), PONV (postoperative nausea and vomiting), and Sleep apnea. She does not have any pertinent problems on file. She  has a past surgical history that includes Cardiac surgery; Abdominal surgery; Cholecystectomy; left sinusplasty ; Joint replacement (Left); Cataract extraction (Right); Partial mastectomy with needle localization (Left, 09/29/2015); Mastectomy, partial (Left, 10/17/2015); Total abdominal hysterectomy w/ bilateral salpingoophorectomy; Appendectomy; Colonoscopy with propofol (N/A, 03/11/2016); Esophagogastroduodenoscopy (egd) with propofol (N/A, 03/11/2016); Abdominal hysterectomy; Hardware Removal (Right, 09/03/2018); Ganglion cyst excision (Right, 09/03/2018); Finger arthroscopy with carpometacarpel(cmc) arthroplasty (Right,  09/03/2018); Breast biopsy (Bilateral, 2016); Breast biopsy (Left, 09/11/2015); Breast biopsy (Left, 05/27/2016); Breast biopsy (Left, 11/20/2017); and Breast lumpectomy (Left, 10/17/2015). Her family history includes Bladder Cancer in her brother; Breast cancer in her paternal grandmother; Colon cancer in her father and maternal uncle; Diabetes in her brother and sister; Heart disease in her brother; Leukemia in her mother; Prostate cancer in her brother and brother. She  reports that she has never smoked. She has never used smokeless tobacco. She reports that she does not drink alcohol and does not use drugs. She has a current medication list which includes the following prescription(s): aspirin, benzonatate, budesonide-formoterol, vitamin d, clobetasol cream, cyanocobalamin, cyanocobalamin, desloratadine, dicyclomine, epinephrine, escitalopram, esomeprazole, estradiol, fenofibrate, fluconazole, fluticasone, furosemide, gabapentin, glimepiride, hyoscyamine sulfate, levothyroxine, magnesium oxide, metformin, novolog flexpen, olopatadine, ondansetron, potassium chloride sa, saccharomyces boulardii, simvastatin, tresiba flextouch, valacyclovir, and valsartan, and the following Facility-Administered Medications: clobetasol ointment. She is allergic to codeine, demeclocycline, demerol [meperidine], hydrocodone, oxycodone, pentazocine, tetracyclines & related, and fentanyl.       Review of Systems:  Review of Systems  Constitutional: Denied constitutional symptoms, night sweats, recent illness, fatigue, fever, insomnia and weight loss.  Eyes: Denied eye symptoms, eye pain, photophobia, vision change and visual disturbance.  Ears/Nose/Throat/Neck: Denied ear, nose, throat or neck symptoms, hearing loss, nasal discharge, sinus congestion and sore throat.  Cardiovascular: Denied cardiovascular symptoms, arrhythmia, chest pain/pressure, edema, exercise intolerance, orthopnea and palpitations.  Respiratory:  Denied pulmonary symptoms, asthma, pleuritic pain, productive sputum, cough, dyspnea and wheezing.  Gastrointestinal: Denied, gastro-esophageal reflux, melena, nausea and vomiting.  Genitourinary: See HPI for additional information.  Musculoskeletal: Denied musculoskeletal symptoms, stiffness, swelling, muscle weakness and myalgia.  Dermatologic: Denied dermatology symptoms, rash and scar.  Neurologic: Denied neurology symptoms, dizziness, headache, neck pain and syncope.  Psychiatric: Denied psychiatric symptoms, anxiety and depression.  Endocrine: Denied endocrine symptoms including hot flashes and night  sweats.   Meds:   Current Outpatient Medications on File Prior to Visit  Medication Sig Dispense Refill  . aspirin 81 MG tablet Take 81 mg by mouth at bedtime.    . benzonatate (TESSALON) 200 MG capsule Take 200 mg by mouth 3 (three) times daily as needed for cough.    . budesonide-formoterol (SYMBICORT) 80-4.5 MCG/ACT inhaler Inhale 2 puffs into the lungs 2 (two) times daily as needed (shortness of breath).     . Cholecalciferol (VITAMIN D) 50 MCG (2000 UT) CAPS Take 2,000 Units by mouth daily.    . clobetasol cream (TEMOVATE) 0.09 % Apply 1 application topically 2 (two) times a week. Apply topically to affected areas as directed 2 times per week. 12 g 1  . cyanocobalamin (,VITAMIN B-12,) 1000 MCG/ML injection     . Cyanocobalamin (B-12 COMPLIANCE INJECTION) 1000 MCG/ML KIT Inject 1,000 mcg as directed every 30 (thirty) days.     Marland Kitchen desloratadine (CLARINEX) 5 MG tablet Take 5 mg by mouth daily.    Marland Kitchen dicyclomine (BENTYL) 10 MG capsule Take 10 mg by mouth at bedtime.     Marland Kitchen EPINEPHrine 0.3 mg/0.3 mL IJ SOAJ injection     . escitalopram (LEXAPRO) 5 MG tablet Take 5 mg by mouth at bedtime.    Marland Kitchen esomeprazole (NEXIUM) 40 MG capsule Take 40 mg by mouth 2 (two) times daily before a meal.     . estradiol (ESTRACE) 0.1 MG/GM vaginal cream Place 3.81 Applicatorfuls vaginally 2 (two) times a week. 42.5  g 2  . fenofibrate 160 MG tablet Take 160 mg by mouth at bedtime.    . fluticasone (FLONASE) 50 MCG/ACT nasal spray Place 1 spray into the nose daily as needed for allergies.     . furosemide (LASIX) 20 MG tablet Take 20-40 mg by mouth See admin instructions. Take once a day. alternate taking 42m and 477m    . gabapentin (NEURONTIN) 300 MG capsule Take 300 mg by mouth at bedtime.     . Marland Kitchenlimepiride (AMARYL) 2 MG tablet Take 2 mg by mouth at bedtime.    . Marland Kitchenyoscyamine Sulfate 0.375 MG TBCR Take 0.375 mg by mouth 2 (two) times daily.     . Marland Kitchenevothyroxine (SYNTHROID, LEVOTHROID) 75 MCG tablet Take 75 mcg by mouth daily before breakfast.     . magnesium oxide (MAG-OX) 400 MG tablet Take 400 mg by mouth 2 (two) times daily.     . metFORMIN (GLUCOPHAGE-XR) 750 MG 24 hr tablet Take 750 mg by mouth 2 (two) times daily.    . Marland KitchenOVOLOG FLEXPEN 100 UNIT/ML FlexPen Based on sliding scale    . olopatadine (PATANOL) 0.1 % ophthalmic solution Place 1 drop into both eyes 2 (two) times daily as needed for allergies.     . Marland Kitchenndansetron (ZOFRAN) 4 MG tablet Take 4 mg by mouth every 8 (eight) hours as needed for nausea or vomiting.   1  . potassium chloride SA (K-DUR,KLOR-CON) 20 MEQ tablet Take 20 mEq by mouth 2 (two) times daily.     . Marland Kitchenaccharomyces boulardii (FLORASTOR) 250 MG capsule Take 250 mg by mouth 2 (two) times daily.     . simvastatin (ZOCOR) 20 MG tablet Take 20 mg by mouth at bedtime.    . Tyler AasLEXTOUCH 100 UNIT/ML FlexTouch Pen     . valACYclovir (VALTREX) 500 MG tablet Take 500 mg by mouth See admin instructions. For fever blisters take [id number] a day.  1  . valsartan (DIOVAN) 160 MG  tablet Take 160 mg by mouth every morning.      No current facility-administered medications on file prior to visit.          Objective:     Vitals:   11/18/19 1053  BP: (!) 150/56  Pulse: 62   Filed Weights   11/18/19 1053  Weight: 166 lb 6.4 oz (75.5 kg)            Physical examination   Pelvic:    Vulva:  Erythematous without leukoplakia  Vagina: No lesions or abnormalities noted.  Support: Normal pelvic support.  Urethra No masses tenderness or scarring.  Meatus Normal size without lesions or prolapse.  Cervix:   Anus: Normal exam.  No lesions.  Perineum: Normal exam.  No lesions.    Assessment:    [id number] Patient Active Problem List   Diagnosis Date Noted  . Hav (hallux abducto valgus), unspecified laterality 06/03/2019  . Acquired bilateral hammer toes 06/03/2019  . Iron deficiency 04/26/2019  . Right-sided chest wall pain 04/12/2019  . Mid back pain on right side 04/12/2019  . 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 Recurrent UTI (urinary tract infection) 07/23/2017  . Nausea vomiting and diarrhea 01/11/2017  . Hypothyroidism 01/11/2017  . HTN (hypertension) 01/11/2017  . Diabetes ([place]) 01/11/2017  . GERD (gastroesophageal reflux disease) 01/11/2017  . Other pancytopenia ([place]) 11/14/2016  . 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 Status post hysterectomy 06/20/2016  . 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 Vaginal atrophy 01/12/2016  . 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 Lichen sclerosus   2. Chronic vulvitis     I believe the patient's lichen sclerosus is under control.  I think her problem is chronic vulvitis likely secondary to monilia.  Her elevated sugars have worsened and established this condition.   Plan:            1.  Long-term treatment with antifungal.  2.  Strongly recommend close follow-up with endocrinologist as scheduled for glycemic control  3.  As patient needs a new prescription for clobetasol will use the ointment instead of the cream as an alternative. Orders No orders of the defined types were placed in this encounter.    Meds ordered this encounter  Medications  . clobetasol ointment (TEMOVATE) 0.05 %  .  fluconazole (DIFLUCAN) 150 MG tablet    Sig: Take 1 tablet (150 mg total) by mouth every 3 (three) days. For three doses    Dispense:  14 tablet    Refill:  0      F/U  Return in about 6 weeks (around 12/30/2019). I spent 31 minutes involved in the care of this patient preparing to see the patient by obtaining and reviewing her medical history (including labs, imaging tests and prior procedures), documenting clinical information in the electronic health record (EHR), counseling and coordinating care plans, writing and sending prescriptions, ordering tests or procedures and directly communicating with the patient by discussing pertinent items from her history and physical exam as well as detailing my assessment and plan as noted above so that she has an informed understanding.  All of her questions were answered.  Finis Bud, M.D. 11/18/2019 11:21 AM

## 2019-11-18 NOTE — Telephone Encounter (Signed)
Patient called in stating that she was instructed to start two medications however when she went to pick them up from the pharmacy they only had one for her. Patient is stating that she was unable to get the ointment she was instructed to start using. Could you please advise?

## 2019-11-18 NOTE — Telephone Encounter (Signed)
I have sent the new prescription to the pharmacy. I have LM for patient.

## 2019-11-18 NOTE — Addendum Note (Signed)
Addended by: Finis Bud on: 11/18/2019 03:16 PM   Modules accepted: Orders

## 2019-11-19 LAB — CERVICOVAGINAL ANCILLARY ONLY
Bacterial Vaginitis (gardnerella): NEGATIVE
Candida Glabrata: POSITIVE — AB
Candida Vaginitis: POSITIVE — AB
Comment: NEGATIVE
Comment: NEGATIVE
Comment: NEGATIVE

## 2019-11-30 ENCOUNTER — Telehealth: Payer: Self-pay

## 2019-11-30 NOTE — Telephone Encounter (Signed)
Spoke with patient and she stated the medicine that she was given has taken care of her problem. Confirmed the culture did show yeast. Encouraged patient to reach back out if she had any other questions or concerns.

## 2019-12-20 ENCOUNTER — Other Ambulatory Visit: Payer: Self-pay | Admitting: General Surgery

## 2019-12-20 DIAGNOSIS — Z853 Personal history of malignant neoplasm of breast: Secondary | ICD-10-CM

## 2019-12-24 ENCOUNTER — Other Ambulatory Visit: Payer: Self-pay

## 2019-12-24 ENCOUNTER — Ambulatory Visit
Admission: RE | Admit: 2019-12-24 | Discharge: 2019-12-24 | Disposition: A | Discharge status: Home / Self Care | Payer: Medicare PPO | Source: Ambulatory Visit | Attending: Radiation Oncology | Admitting: Radiation Oncology

## 2019-12-24 ENCOUNTER — Encounter: Payer: Self-pay | Admitting: Radiation Oncology

## 2019-12-24 VITALS — BP 129/61 | HR 60 | Temp 96.7°F | Wt 164.0 lb

## 2019-12-24 DIAGNOSIS — Z923 Personal history of irradiation: Secondary | ICD-10-CM | POA: Diagnosis not present

## 2019-12-24 DIAGNOSIS — Z86000 Personal history of in-situ neoplasm of breast: Secondary | ICD-10-CM | POA: Insufficient documentation

## 2019-12-24 DIAGNOSIS — K7460 Unspecified cirrhosis of liver: Secondary | ICD-10-CM | POA: Diagnosis not present

## 2019-12-24 DIAGNOSIS — D0512 Intraductal carcinoma in situ of left breast: Secondary | ICD-10-CM

## 2019-12-24 NOTE — Progress Notes (Signed)
Radiation Oncology Follow up Note  Name: Annette Foster   Date:   12/24/2019 MRN:  [id number] DOB: Oct 08, 1940    This 79 y.o. female presents to the clinic today for over 4-year follow-up status post whole breast radiation to her left breast for ER/PR positive ductal carcinoma in situ.  REFERRING PROVIDER: Albina Billet, MD  HPI: Patient is a 79 year old female now out over 4 years having completed whole breast radiation to her left breast for ER/PR positive ductal carcinoma in situ seen today in routine follow-up she is doing well.  Specifically denies breast tenderness cough.  She does have some pain in her posterior right thorax.  She also has a diagnosis of cirrhosis of the liver..  She is not had a mammogram since December which was BI-RADS 2 benign which I have reviewed.  She also had an ultrasound back in December showing splenomegaly and some trace perihepatic free fluid.  COMPLICATIONS OF TREATMENT: none  FOLLOW UP COMPLIANCE: keeps appointments   PHYSICAL EXAM:  BP 129/61 (BP Location: Right Arm, Patient Position: Sitting, Cuff Size: Small)   Pulse 60   Temp (!) 96.7 F (35.9 C) (Tympanic)   Wt 164 lb (74.4 kg)   LMP  (LMP Unknown)   BMI 29.05 kg/m  Lungs are clear to A&P cardiac examination essentially unremarkable with regular rate and rhythm. No dominant mass or nodularity is noted in either breast in 2 positions examined. Incision is well-healed. No axillary or supraclavicular adenopathy is appreciated. Cosmetic result is excellent.  There is some tenderness on deep palpation of her right posterior thorax.  Well-developed well-nourished patient in NAD. HEENT reveals PERLA, EOMI, discs not visualized.  Oral cavity is clear. No oral mucosal lesions are identified. Neck is clear without evidence of cervical or supraclavicular adenopathy. Lungs are clear to A&P. Cardiac examination is essentially unremarkable with regular rate and rhythm without murmur rub or thrill. Abdomen  is benign with no organomegaly or masses noted. Motor sensory and DTR levels are equal and symmetric in the upper and lower extremities. Cranial nerves II through XII are grossly intact. Proprioception is intact. No peripheral adenopathy or edema is identified. No motor or sensory levels are noted. Crude visual fields are within normal range.  RADIOLOGY RESULTS: Mammograms and ultrasound of abdomen reviewed compatible with above-stated findings  PLAN: Present time she is now out over 4 years I am to turn follow-up care over to her surgeon as well as her family doctor.  I have asked her to follow-up with them should this pain persist in the meantime she will use Tylenol as needed as well as some warm compresses.  She is to contact me should this exacerbate based on her stage 0 ductal carcinoma in situ certainly do not believe this is metastatic breast cancer.  I have made my patient aware of my opinion.  I would like to take this opportunity to thank you for allowing me to participate in the care of your patient.Noreene Filbert, MD

## 2019-12-31 ENCOUNTER — Other Ambulatory Visit: Payer: Self-pay

## 2019-12-31 ENCOUNTER — Encounter: Payer: Self-pay | Admitting: Obstetrics and Gynecology

## 2019-12-31 ENCOUNTER — Ambulatory Visit (INDEPENDENT_AMBULATORY_CARE_PROVIDER_SITE_OTHER): Payer: Medicare PPO | Admitting: Obstetrics and Gynecology

## 2019-12-31 VITALS — BP 129/51 | HR 60 | Ht 63.0 in | Wt 166.0 lb

## 2019-12-31 DIAGNOSIS — N763 Subacute and chronic vulvitis: Secondary | ICD-10-CM | POA: Diagnosis not present

## 2019-12-31 DIAGNOSIS — L900 Lichen sclerosus et atrophicus: Secondary | ICD-10-CM | POA: Diagnosis not present

## 2019-12-31 DIAGNOSIS — N952 Postmenopausal atrophic vaginitis: Secondary | ICD-10-CM | POA: Diagnosis not present

## 2019-12-31 NOTE — Progress Notes (Signed)
HPI:      [name] is a 79 y.o. [phone] who LMP was No LMP recorded (lmp unknown). Patient has had a hysterectomy.  Subjective:   She presents today for follow-up of her chronic vulvitis and lichen sclerosis.  She says she is much much better.  She feels as if her chronic irritation has resolved.  She has completed her course of Diflucan and continues to use the clobetasol ointment.  She likes the ointment better than the cream. Of significant note, she does say that her sugars are still not well controlled and she plans to stop by the endocrinology office today for further instruction on how to improve her sugars.    Hx: The following portions of the patient's history were reviewed and updated as appropriate:             She  has a past medical history of Abdominal pain, Allergy, Cancer ([place]) (2017), Cataract, CHF (congestive heart failure) ([place]), Collagenous colitis, Diabetes mellitus without complication ([place]), Diarrhea, Diverticulosis, Fatty liver disease, nonalcoholic, Fibrocystic breast, GERD (gastroesophageal reflux disease), Heart murmur, Hyperlipidemia, Hypertension, Hypothyroidism, IBS (irritable bowel syndrome), IBS (irritable bowel syndrome), IDA (iron deficiency anemia), Personal history of radiation therapy (2017), PONV (postoperative nausea and vomiting), and Sleep apnea. She does not have any pertinent problems on file. She  has a past surgical history that includes Cardiac surgery; Abdominal surgery; Cholecystectomy; left sinusplasty ; Joint replacement (Left); Cataract extraction (Right); Partial mastectomy with needle localization (Left, 09/29/2015); Mastectomy, partial (Left, 10/17/2015); Total abdominal hysterectomy w/ bilateral salpingoophorectomy; Appendectomy; Colonoscopy with propofol (N/A, 03/11/2016); Esophagogastroduodenoscopy (egd) with propofol (N/A, 03/11/2016); Abdominal hysterectomy; Hardware Removal (Right, 09/03/2018); Ganglion cyst excision (Right, 09/03/2018);  Finger arthroscopy with carpometacarpel(cmc) arthroplasty (Right, 09/03/2018); Breast biopsy (Bilateral, 2016); Breast biopsy (Left, 09/11/2015); Breast biopsy (Left, 05/27/2016); Breast biopsy (Left, 11/20/2017); and Breast lumpectomy (Left, 10/17/2015). Her family history includes Bladder Cancer in her brother; Breast cancer in her paternal grandmother; Colon cancer in her father and maternal uncle; Diabetes in her brother and sister; Heart disease in her brother; Leukemia in her mother; Prostate cancer in her brother and brother. She  reports that she has never smoked. She has never used smokeless tobacco. She reports that she does not drink alcohol and does not use drugs. She has a current medication list which includes the following prescription(s): aspirin, benzonatate, budesonide-formoterol, vitamin d, clobetasol ointment, cyanocobalamin, cyanocobalamin, desloratadine, dicyclomine, epinephrine, escitalopram, esomeprazole, estradiol, fenofibrate, fluticasone, furosemide, gabapentin, glimepiride, hyoscyamine sulfate, levothyroxine, magnesium oxide, metformin, novolog flexpen, olopatadine, ondansetron, potassium chloride sa, saccharomyces boulardii, simvastatin, tresiba flextouch, valacyclovir, and valsartan, and the following Facility-Administered Medications: clobetasol ointment. She is allergic to codeine, demeclocycline, demerol [meperidine], hydrocodone, oxycodone, pentazocine, tetracyclines & related, and fentanyl.       Review of Systems:  Review of Systems  Constitutional: Denied constitutional symptoms, night sweats, recent illness, fatigue, fever, insomnia and weight loss.  Eyes: Denied eye symptoms, eye pain, photophobia, vision change and visual disturbance.  Ears/Nose/Throat/Neck: Denied ear, nose, throat or neck symptoms, hearing loss, nasal discharge, sinus congestion and sore throat.  Cardiovascular: Denied cardiovascular symptoms, arrhythmia, chest pain/pressure, edema, exercise  intolerance, orthopnea and palpitations.  Respiratory: Denied pulmonary symptoms, asthma, pleuritic pain, productive sputum, cough, dyspnea and wheezing.  Gastrointestinal: Denied, gastro-esophageal reflux, melena, nausea and vomiting.  Genitourinary: Denied genitourinary symptoms including symptomatic vaginal discharge, pelvic relaxation issues, and urinary complaints.  Musculoskeletal: Denied musculoskeletal symptoms, stiffness, swelling, muscle weakness and myalgia.  Dermatologic: Denied dermatology symptoms, rash and scar.  Neurologic: Denied  neurology symptoms, dizziness, headache, neck pain and syncope.  Psychiatric: Denied psychiatric symptoms, anxiety and depression.  Endocrine: Denied endocrine symptoms including hot flashes and night sweats.   Meds:   Current Outpatient Medications on File Prior to Visit  Medication Sig Dispense Refill  . aspirin 81 MG tablet Take 81 mg by mouth at bedtime.    . benzonatate (TESSALON) 200 MG capsule Take 200 mg by mouth 3 (three) times daily as needed for cough.    . budesonide-formoterol (SYMBICORT) 80-4.5 MCG/ACT inhaler Inhale 2 puffs into the lungs 2 (two) times daily as needed (shortness of breath).     . Cholecalciferol (VITAMIN D) 50 MCG (2000 UT) CAPS Take 2,000 Units by mouth daily.    . clobetasol ointment (TEMOVATE) 3.90 % Apply 1 application topically 2 (two) times a week. 45 g 1  . cyanocobalamin (,VITAMIN B-12,) 1000 MCG/ML injection     . Cyanocobalamin (B-12 COMPLIANCE INJECTION) 1000 MCG/ML KIT Inject 1,000 mcg as directed every 30 (thirty) days.     Marland Kitchen desloratadine (CLARINEX) 5 MG tablet Take 5 mg by mouth daily.    Marland Kitchen dicyclomine (BENTYL) 10 MG capsule Take 10 mg by mouth at bedtime.     Marland Kitchen EPINEPHrine 0.3 mg/0.3 mL IJ SOAJ injection     . escitalopram (LEXAPRO) 5 MG tablet Take 5 mg by mouth at bedtime.    Marland Kitchen esomeprazole (NEXIUM) 40 MG capsule Take 40 mg by mouth 2 (two) times daily before a meal.     . estradiol (ESTRACE) 0.1  MG/GM vaginal cream Place 3.00 Applicatorfuls vaginally 2 (two) times a week. 42.5 g 2  . fenofibrate 160 MG tablet Take 160 mg by mouth at bedtime.    . fluticasone (FLONASE) 50 MCG/ACT nasal spray Place 1 spray into the nose daily as needed for allergies.     . furosemide (LASIX) 20 MG tablet Take 20-40 mg by mouth See admin instructions. Take once a day. alternate taking 7m and 412m    . gabapentin (NEURONTIN) 300 MG capsule Take 300 mg by mouth at bedtime.     . Marland Kitchenlimepiride (AMARYL) 2 MG tablet Take 2 mg by mouth at bedtime.    . Marland Kitchenyoscyamine Sulfate 0.375 MG TBCR Take 0.375 mg by mouth 2 (two) times daily.     . Marland Kitchenevothyroxine (SYNTHROID, LEVOTHROID) 75 MCG tablet Take 75 mcg by mouth daily before breakfast.     . magnesium oxide (MAG-OX) 400 MG tablet Take 400 mg by mouth 2 (two) times daily.     . metFORMIN (GLUCOPHAGE-XR) 750 MG 24 hr tablet Take 750 mg by mouth 2 (two) times daily.    . Marland KitchenOVOLOG FLEXPEN 100 UNIT/ML FlexPen Based on sliding scale    . olopatadine (PATANOL) 0.1 % ophthalmic solution Place 1 drop into both eyes 2 (two) times daily as needed for allergies.     . Marland Kitchenndansetron (ZOFRAN) 4 MG tablet Take 4 mg by mouth every 8 (eight) hours as needed for nausea or vomiting.   1  . potassium chloride SA (K-DUR,KLOR-CON) 20 MEQ tablet Take 20 mEq by mouth 2 (two) times daily.     . Marland Kitchenaccharomyces boulardii (FLORASTOR) 250 MG capsule Take 250 mg by mouth 2 (two) times daily.     . simvastatin (ZOCOR) 20 MG tablet Take 20 mg by mouth at bedtime.    . Tyler AasLEXTOUCH 100 UNIT/ML FlexTouch Pen     . valACYclovir (VALTREX) 500 MG tablet Take 500 mg by mouth See admin instructions. For  fever blisters take 536m twice a day.  1  . valsartan (DIOVAN) 160 MG tablet Take 160 mg by mouth every morning.      Current Facility-Administered Medications on File Prior to Visit  Medication Dose Route Frequency Provider Last Rate Last Admin  . clobetasol ointment (TEMOVATE) 0.05 %   Topical Once  per day on Mon Thu Ariana Juul James, MD              Objective:     Vitals:   12/31/19 1051  BP: (!) 129/51  Pulse: 60   Filed Weights   12/31/19 1051  Weight: 166 lb (75.3 kg)              Physical examination   Pelvic:  Vulva: Normal appearance.  No lesions.  Much improved no leukoplakia no erythema  Vagina: No lesions or abnormalities noted.  Support: Normal pelvic support.  Urethra No masses tenderness or scarring.  Meatus Normal size without lesions or prolapse.     Anus: Normal exam.  No lesions.  Perineum: Normal exam.  No lesions.     Assessment:    [id number] Active Problem List   Diagnosis Date Noted  . Hav (hallux abducto valgus), unspecified laterality 06/03/2019  . Acquired bilateral hammer toes 06/03/2019  . Iron deficiency 04/26/2019  . Right-sided chest wall pain 04/12/2019  . Mid back pain on right side 04/12/2019  . 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 Recurrent UTI (urinary tract infection) 07/23/2017  . Nausea vomiting and diarrhea 01/11/2017  . Hypothyroidism 01/11/2017  . HTN (hypertension) 01/11/2017  . Diabetes ([place] 01/11/2017  . GERD (gastroesophageal reflux disease) 01/11/2017  . Other pancytopenia ([place] 11/14/2016  . 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 Status post hysterectomy 06/20/2016  . 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 Vaginal atrophy 01/12/2016  . 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 Chronic vulvitis   2. Lichen sclerosus   3. Vaginal atrophy     Chronic vulvitis is now resolved.   Plan:            1.  Continue clobetasol use twice weekly as discussed.  2.  Strongly recommend glycemic control to prevent chronic vulvitis.  Patient to [place] begins to have any symptoms.  3.  Continue vaginal estrogen twice weekly. Orders No orders of the defined types were placed in this  encounter.   No orders of the defined types were placed in this encounter.     F/U  Return in about 6 months (around 06/29/2020). I spent 22 minutes involved in the care of this patient preparing to see the patient by obtaining and reviewing her medical history (including labs, imaging tests and prior procedures), documenting clinical information in the electronic health record (EHR), counseling and coordinating care plans, writing and sending prescriptions, ordering tests or procedures and directly communicating with the patient by discussing pertinent items from her history and physical exam as well as detailing my assessment and plan as noted above so that she has an informed understanding.  All of her questions were answered.  DFinis Bud M.D. 12/31/2019 11:08 AM

## 2020-01-10 ENCOUNTER — Other Ambulatory Visit: Payer: Self-pay | Admitting: Obstetrics and Gynecology

## 2020-01-10 DIAGNOSIS — N763 Subacute and chronic vulvitis: Secondary | ICD-10-CM

## 2020-01-10 NOTE — Telephone Encounter (Signed)
Please advise on refill.

## 2020-01-18 ENCOUNTER — Inpatient Hospital Stay (HOSPITAL_BASED_OUTPATIENT_CLINIC_OR_DEPARTMENT_OTHER): Payer: Medicare PPO | Admitting: Internal Medicine

## 2020-01-18 ENCOUNTER — Ambulatory Visit
Admission: RE | Admit: 2020-01-18 | Discharge: 2020-01-18 | Disposition: A | Discharge status: Home / Self Care | Payer: Medicare PPO | Source: Ambulatory Visit | Attending: Internal Medicine | Admitting: Internal Medicine

## 2020-01-18 ENCOUNTER — Encounter: Payer: Self-pay | Admitting: Internal Medicine

## 2020-01-18 ENCOUNTER — Inpatient Hospital Stay: Payer: Medicare PPO | Attending: Internal Medicine

## 2020-01-18 ENCOUNTER — Inpatient Hospital Stay: Payer: Medicare PPO

## 2020-01-18 VITALS — BP 128/63 | HR 81 | Resp 16

## 2020-01-18 VITALS — BP 114/88 | HR 64 | Temp 97.2°F | Resp 18 | Wt 168.6 lb

## 2020-01-18 DIAGNOSIS — M549 Dorsalgia, unspecified: Secondary | ICD-10-CM | POA: Insufficient documentation

## 2020-01-18 DIAGNOSIS — K766 Portal hypertension: Secondary | ICD-10-CM | POA: Insufficient documentation

## 2020-01-18 DIAGNOSIS — K7460 Unspecified cirrhosis of liver: Secondary | ICD-10-CM | POA: Insufficient documentation

## 2020-01-18 DIAGNOSIS — E119 Type 2 diabetes mellitus without complications: Secondary | ICD-10-CM | POA: Diagnosis not present

## 2020-01-18 DIAGNOSIS — D61818 Other pancytopenia: Secondary | ICD-10-CM | POA: Diagnosis not present

## 2020-01-18 DIAGNOSIS — D649 Anemia, unspecified: Secondary | ICD-10-CM | POA: Diagnosis present

## 2020-01-18 DIAGNOSIS — E611 Iron deficiency: Secondary | ICD-10-CM

## 2020-01-18 DIAGNOSIS — M546 Pain in thoracic spine: Secondary | ICD-10-CM | POA: Diagnosis not present

## 2020-01-18 DIAGNOSIS — R0789 Other chest pain: Secondary | ICD-10-CM

## 2020-01-18 DIAGNOSIS — Z86000 Personal history of in-situ neoplasm of breast: Secondary | ICD-10-CM | POA: Diagnosis present

## 2020-01-18 DIAGNOSIS — Z806 Family history of leukemia: Secondary | ICD-10-CM | POA: Diagnosis not present

## 2020-01-18 DIAGNOSIS — Z800 Family history of malignant neoplasm of digestive organs: Secondary | ICD-10-CM | POA: Diagnosis not present

## 2020-01-18 DIAGNOSIS — D0512 Intraductal carcinoma in situ of left breast: Secondary | ICD-10-CM

## 2020-01-18 DIAGNOSIS — Z794 Long term (current) use of insulin: Secondary | ICD-10-CM | POA: Diagnosis not present

## 2020-01-18 DIAGNOSIS — Z8052 Family history of malignant neoplasm of bladder: Secondary | ICD-10-CM | POA: Diagnosis not present

## 2020-01-18 DIAGNOSIS — K7581 Nonalcoholic steatohepatitis (NASH): Secondary | ICD-10-CM

## 2020-01-18 DIAGNOSIS — D6959 Other secondary thrombocytopenia: Secondary | ICD-10-CM | POA: Diagnosis not present

## 2020-01-18 DIAGNOSIS — Z8042 Family history of malignant neoplasm of prostate: Secondary | ICD-10-CM | POA: Diagnosis not present

## 2020-01-18 DIAGNOSIS — Z923 Personal history of irradiation: Secondary | ICD-10-CM | POA: Diagnosis not present

## 2020-01-18 LAB — COMPREHENSIVE METABOLIC PANEL
ALT: 24 U/L (ref 0–44)
AST: 51 U/L — ABNORMAL HIGH (ref 15–41)
Albumin: 3.9 g/dL (ref 3.5–5.0)
Alkaline Phosphatase: 51 U/L (ref 38–126)
Anion gap: 10 (ref 5–15)
BUN: 15 mg/dL (ref 8–23)
CO2: 25 mmol/L (ref 22–32)
Calcium: 8.8 mg/dL — ABNORMAL LOW (ref 8.9–10.3)
Chloride: 97 mmol/L — ABNORMAL LOW (ref 98–111)
Creatinine, Ser: 0.72 mg/dL (ref 0.44–1.00)
GFR, Estimated: >60 mL/min (ref >60)
Glucose, Bld: 263 mg/dL — ABNORMAL HIGH (ref 70–99)
Potassium: 4.1 mmol/L (ref 3.5–5.1)
Sodium: 132 mmol/L — ABNORMAL LOW (ref 135–145)
Total Bilirubin: 1 mg/dL (ref 0.3–1.2)
Total Protein: 7.9 g/dL (ref 6.5–8.1)

## 2020-01-18 LAB — CBC WITH DIFFERENTIAL/PLATELET
Abs Immature Granulocytes: 0.01 10*3/uL (ref 0.00–0.07)
Basophils Absolute: 0 10*3/uL (ref 0.0–0.1)
Basophils Relative: 0 %
Eosinophils Absolute: 0.1 10*3/uL (ref 0.0–0.5)
Eosinophils Relative: 5 %
HCT: 34.3 % — ABNORMAL LOW (ref 36.0–46.0)
Hemoglobin: 11.8 g/dL — ABNORMAL LOW (ref 12.0–15.0)
Immature Granulocytes: 0 %
Lymphocytes Relative: 40 %
Lymphs Abs: 0.9 10*3/uL (ref 0.7–4.0)
MCH: 30.8 pg (ref 26.0–34.0)
MCHC: 34.4 g/dL (ref 30.0–36.0)
MCV: 89.6 fL (ref 80.0–100.0)
Monocytes Absolute: 0.2 10*3/uL (ref 0.1–1.0)
Monocytes Relative: 10 %
Neutro Abs: 1.1 10*3/uL — ABNORMAL LOW (ref 1.7–7.7)
Neutrophils Relative %: 45 %
Platelets: 100 10*3/uL — ABNORMAL LOW (ref 150–400)
RBC: 3.83 MIL/uL — ABNORMAL LOW (ref 3.87–5.11)
RDW: 13.9 % (ref 11.5–15.5)
WBC: 2.4 10*3/uL — ABNORMAL LOW (ref 4.0–10.5)
nRBC: 0 % (ref 0.0–0.2)

## 2020-01-18 LAB — IRON AND TIBC
Iron: 70 ug/dL (ref 28–170)
Saturation Ratios: 15 % (ref 10.4–31.8)
TIBC: 461 ug/dL — ABNORMAL HIGH (ref 250–450)
UIBC: 391 ug/dL

## 2020-01-18 LAB — FERRITIN: Ferritin: 40 ng/mL (ref 11–307)

## 2020-01-18 MED ORDER — IRON SUCROSE 20 MG/ML IV SOLN
200.0000 mg | Freq: Once | INTRAVENOUS | Status: AC
  Administered 2020-01-18: 200 mg via INTRAVENOUS
  Filled 2020-01-18: qty 10

## 2020-01-18 MED ORDER — SODIUM CHLORIDE 0.9 % IV SOLN
Freq: Once | INTRAVENOUS | Status: AC
  Filled 2020-01-18: qty 250

## 2020-01-18 NOTE — Progress Notes (Signed)
[facility] OFFICE PROGRESS NOTE  Patient Care Team: Albina Billet, MD as PCP - General (Internal Medicine)  Cancer Staging No matching staging information was found for the patient.   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 OCT 2018- BMBx- MILD dyspoiesis; variable cellularity [10-50%]; FISH/Cytogenetics-NORMAL; F-One-NGS-declined by insurance.    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 Brandon/uro-gyne,[facility]]  # HRT [clinical trial thru [place]; stopped July 2017 ]; CPAP   DIAGNOSIS: Left breast DCIS  STAGE:    0     ;GOALS: cure  CURRENT/MOST RECENT THERAPY: surveillaince    Ductal carcinoma in situ (DCIS) of left breast    INTERVAL HISTORY:  Annette Foster 79 y.o.  female pleasant patient above history of DCIS; cirrhosis portal hypertension is here for follow-up.  Patient denies any recent hospitalizations. No more recent UTIs. No signs of infection pneumonias.  No blood in stools or black-colored stools. No abdominal distention or swelling in the legs.  Complains of mild to moderate fatigue. Also complains of low back pain. Patient has been taking Tylenol with improvement. Worse with movement. No radiation on the legs.  Review of Systems  Constitutional: Positive for malaise/fatigue. Negative for chills, diaphoresis, fever and weight loss.  HENT: Negative for nosebleeds and sore throat.   Eyes: Negative for double vision.  Respiratory: Negative for cough, hemoptysis, sputum production, shortness of breath and wheezing.   Cardiovascular: Negative for chest pain, palpitations, orthopnea and leg  swelling.  Gastrointestinal: Negative for abdominal pain, blood in stool, constipation, diarrhea, heartburn, melena, nausea and vomiting.  Musculoskeletal: Positive for back pain and joint pain.  Skin: Negative.  Negative for itching and rash.  Neurological: Negative for dizziness, tingling, focal weakness, weakness and headaches.  Endo/Heme/Allergies: Does not bruise/bleed easily.  Psychiatric/Behavioral: Negative for depression. The patient is not nervous/anxious and does not have insomnia.       PAST MEDICAL HISTORY :  Past Medical History:  Diagnosis Date  . Abdominal pain   . Allergy   . Cancer [facility]) 2017   breast cancer- Left  . Cataract   . CHF (congestive heart failure) ([place])   . Collagenous colitis   . Diabetes mellitus without complication ([place])   . Diarrhea   . Diverticulosis   . Fatty liver disease, nonalcoholic   . Fibrocystic breast   . GERD (gastroesophageal reflux disease)   . Heart murmur   . Hyperlipidemia   . Hypertension   . Hypothyroidism   . IBS (irritable bowel syndrome)   . IBS (irritable bowel syndrome)   . IDA (iron deficiency anemia)   . Personal history of radiation therapy 2017   LEFT BREAST CA  . PONV (postoperative nausea and vomiting)   . Sleep apnea    C-Pap    PAST SURGICAL HISTORY :   Past Surgical History:  Procedure Laterality Date  . ABDOMINAL HYSTERECTOMY     [place] bso  . ABDOMINAL SURGERY    . APPENDECTOMY    . BREAST BIOPSY Bilateral 2016   negative  . 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 BREAST BIOPSY Left 05/27/2016   BENIGN MAMMARY EPITHELIUM  . BREAST BIOPSY  Left 11/20/2017   affirm bx x clip BENIGN MAMMARY EPITHELIUM CONSISTENT WITH RAD THERAPY  . BREAST LUMPECTOMY Left 10/17/2015   DCIS and papillary carcinoma insitu, clear margins  . CARDIAC SURGERY     has replacement valve  . CATARACT EXTRACTION Right   . CHOLECYSTECTOMY    . COLONOSCOPY WITH PROPOFOL N/A 03/11/2016    Procedure: COLONOSCOPY WITH PROPOFOL;  Surgeon: Manya Silvas, MD;  Location: [facility] ENDOSCOPY;  Service: Endoscopy;  Laterality: N/A;  . ESOPHAGOGASTRODUODENOSCOPY (EGD) WITH PROPOFOL N/A 03/11/2016   Procedure: ESOPHAGOGASTRODUODENOSCOPY (EGD) WITH PROPOFOL;  Surgeon: Manya Silvas, MD;  Location: [facility] ENDOSCOPY;  Service: Endoscopy;  Laterality: N/A;  . FINGER ARTHROSCOPY WITH CARPOMETACARPEL ([place]) ARTHROPLASTY Right 09/03/2018   Procedure: CARPOMETACARPEL [facility]) ARTHROPLASTY RIGHT THUMB;  Surgeon: Hessie Knows, MD;  Location: [facility] ORS;  Service: Orthopedics;  Laterality: Right;  . GANGLION CYST EXCISION Right 09/03/2018   Procedure: REMOVAL GANGLION OF WRIST;  Surgeon: Hessie Knows, MD;  Location: [facility] ORS;  Service: Orthopedics;  Laterality: Right;  . HARDWARE REMOVAL Right 09/03/2018   Procedure: HARDWARE REMOVAL RIGHT THUMB;  Surgeon: Hessie Knows, MD;  Location: [facility] ORS;  Service: Orthopedics;  Laterality: Right;  staple removed  . JOINT REPLACEMENT Left    TKR  . left sinusplasty     . MASTECTOMY, PARTIAL Left 10/17/2015   Procedure: MASTECTOMY PARTIAL REVISION;  Surgeon: Leonie Green, MD;  Location: [facility] ORS;  Service: General;  Laterality: Left;  . PARTIAL MASTECTOMY WITH NEEDLE LOCALIZATION Left 09/29/2015   Procedure: PARTIAL MASTECTOMY WITH NEEDLE LOCALIZATION;  Surgeon: Leonie Green, MD;  Location: [facility] ORS;  Service: General;  Laterality: Left;  . TOTAL ABDOMINAL HYSTERECTOMY W/ BILATERAL SALPINGOOPHORECTOMY      FAMILY HISTORY :   Family History  Problem Relation Age of Onset  . Breast cancer Paternal Grandmother   . Colon cancer Father   . Diabetes Sister   . Diabetes Brother   . Heart disease Brother   . Prostate cancer Brother   . Colon cancer Maternal Uncle   . Prostate cancer Brother   . Bladder Cancer Brother   . Leukemia Mother        all  . Ovarian cancer Neg Hx   . Kidney cancer Neg Hx     SOCIAL HISTORY:   Social History   Tobacco Use   . Smoking status: Never Smoker  . Smokeless tobacco: Never Used  Vaping Use  . Vaping Use: Never used  Substance Use Topics  . Alcohol use: No    Alcohol/week: 0.0 standard drinks  . Drug use: No    ALLERGIES:  is allergic to codeine, demeclocycline, demerol [meperidine], hydrocodone, oxycodone, pentazocine, tetracyclines & related, and fentanyl.  MEDICATIONS:  Current Outpatient Medications  Medication Sig Dispense Refill  . aspirin 81 MG tablet Take 81 mg by mouth at bedtime.    . benzonatate (TESSALON) 200 MG capsule Take 200 mg by mouth 3 (three) times daily as needed for cough.    . Cholecalciferol (VITAMIN D) 50 MCG (2000 UT) CAPS Take 2,000 Units by mouth daily.    . cyanocobalamin (,VITAMIN B-12,) 1000 MCG/ML injection     . dicyclomine (BENTYL) 10 MG capsule Take 10 mg by mouth at bedtime.     Marland Kitchen escitalopram (LEXAPRO) 5 MG tablet Take 5 mg by mouth at bedtime.    Marland Kitchen esomeprazole (NEXIUM) 40 MG capsule Take 40 mg by mouth 2 (two) times daily before a meal.     . fenofibrate  160 MG tablet Take 160 mg by mouth at bedtime.    . furosemide (LASIX) 20 MG tablet Take 20-40 mg by mouth See admin instructions. Take once a day. alternate taking 62m and 460m    . gabapentin (NEURONTIN) 300 MG capsule Take 300 mg by mouth at bedtime.     . Marland Kitchenlimepiride (AMARYL) 2 MG tablet Take 2 mg by mouth at bedtime.    . Marland Kitchenyoscyamine Sulfate 0.375 MG TBCR Take 0.375 mg by mouth 2 (two) times daily.     . insulin aspart (NOVOLOG) 100 UNIT/ML FlexPen Inject into the skin.    . Insulin Degludec (TRESIBA) 100 UNIT/ML SOLN Inject 50 mg into the skin at bedtime.    . Marland Kitchenevothyroxine (SYNTHROID, LEVOTHROID) 75 MCG tablet Take 75 mcg by mouth daily before breakfast.     . magnesium oxide (MAG-OX) 400 MG tablet Take 400 mg by mouth 2 (two) times daily.     . metFORMIN (GLUCOPHAGE-XR) 750 MG 24 hr tablet Take 750 mg by mouth daily with breakfast.     . NOVOLOG FLEXPEN 100 UNIT/ML FlexPen Based on sliding scale     . potassium chloride SA (K-DUR,KLOR-CON) 20 MEQ tablet Take 20 mEq by mouth 2 (two) times daily.     . Marland Kitchenaccharomyces boulardii (FLORASTOR) 250 MG capsule Take 250 mg by mouth 2 (two) times daily.     . Semaglutide,0.25 or 0.5MG/DOS, 2 MG/1.5ML SOPN Inject into the skin.    . Marland Kitchenimvastatin (ZOCOR) 20 MG tablet Take 20 mg by mouth at bedtime.    . valsartan (DIOVAN) 160 MG tablet Take 160 mg by mouth every morning.     . budesonide-formoterol (SYMBICORT) 80-4.5 MCG/ACT inhaler Inhale 2 puffs into the lungs 2 (two) times daily as needed (shortness of breath).  (Patient not taking: Reported on 01/18/2020)    . clobetasol ointment (TEMOVATE) 0.4.85 Apply 1 application topically 2 (two) times a week. (Patient not taking: Reported on 01/18/2020) 45 g 1  . Cyanocobalamin (B-12 COMPLIANCE INJECTION) 1000 MCG/ML KIT Inject 1,000 mcg as directed every 30 (thirty) days.     . Marland Kitchenesloratadine (CLARINEX) 5 MG tablet Take 5 mg by mouth daily. (Patient not taking: Reported on 01/18/2020)    . EPINEPHrine 0.3 mg/0.3 mL IJ SOAJ injection  (Patient not taking: Reported on 01/18/2020)    . estradiol (ESTRACE) 0.1 MG/GM vaginal cream Place 0.4.62pplicatorfuls vaginally 2 (two) times a week. (Patient not taking: Reported on 01/18/2020) 42.5 g 2  . fluticasone (FLONASE) 50 MCG/ACT nasal spray Place 1 spray into the nose daily as needed for allergies.  (Patient not taking: Reported on 01/18/2020)    . olopatadine (PATANOL) 0.1 % ophthalmic solution Place 1 drop into both eyes 2 (two) times daily as needed for allergies.  (Patient not taking: Reported on 01/18/2020)    . ondansetron (ZOFRAN) 4 MG tablet Take 4 mg by mouth every 8 (eight) hours as needed for nausea or vomiting.  (Patient not taking: Reported on 01/18/2020)  1  . valACYclovir (VALTREX) 500 MG tablet Take 500 mg by mouth See admin instructions. For fever blisters take 50047mwice a day. (Patient not taking: Reported on 01/18/2020)  1   Current Facility-Administered  Medications  Medication Dose Route Frequency Provider Last Rate Last Admin  . clobetasol ointment (TEMOVATE) 0.05 %   Topical Once per day on Mon Thu Evans, David James, MD        PHYSICAL EXAMINATION: ECOG PERFORMANCE STATUS: 1 - Symptomatic but completely ambulatory  BP 114/88 (BP Location: Left Arm, Patient Position: Sitting)   Pulse 64   Temp (!) 97.2 F (36.2 C) (Tympanic)   Resp 18   Wt 168 lb 9.6 oz (76.5 kg)   LMP  (LMP Unknown)   SpO2 98%   BMI 29.87 kg/m   Filed Weights   01/18/20 1340  Weight: 168 lb 9.6 oz (76.5 kg)    Physical Exam HENT:     Head: Normocephalic and atraumatic.     Mouth/Throat:     Pharynx: No oropharyngeal exudate.  Eyes:     Pupils: Pupils are equal, round, and reactive to light.  Cardiovascular:     Rate and Rhythm: Normal rate and regular rhythm.  Pulmonary:     Effort: No respiratory distress.     Breath sounds: No wheezing.  Abdominal:     General: Bowel sounds are normal. There is no distension.     Palpations: Abdomen is soft. There is no mass.     Tenderness: There is no abdominal tenderness. There is no guarding or rebound.  Musculoskeletal:        General: No tenderness. Normal range of motion.     Cervical back: Normal range of motion and neck supple.  Skin:    General: Skin is warm.  Neurological:     Mental Status: She is alert and oriented to person, place, and time.  Psychiatric:        Mood and Affect: Affect normal.      LABORATORY DATA:  I have reviewed the data as listed    Component Value Date/Time   NA 132 (L) 01/18/2020 1315   K 4.1 01/18/2020 1315   CL 97 (L) 01/18/2020 1315   CO2 25 01/18/2020 1315   GLUCOSE 263 (H) 01/18/2020 1315   BUN 15 01/18/2020 1315   CREATININE 0.72 01/18/2020 1315   CALCIUM 8.8 (L) 01/18/2020 1315   PROT 7.9 01/18/2020 1315   ALBUMIN 3.9 01/18/2020 1315   AST 51 (H) 01/18/2020 1315   ALT 24 01/18/2020 1315   ALKPHOS 51 01/18/2020 1315   BILITOT 1.0 01/18/2020 1315    GFRNONAA >60 01/18/2020 1315   GFRAA >60 10/19/2019 1254    No results found for: SPEP, UPEP  Lab Results  Component Value Date   WBC 2.4 (L) 01/18/2020   NEUTROABS 1.1 (L) 01/18/2020   HGB 11.8 (L) 01/18/2020   HCT 34.3 (L) 01/18/2020   MCV 89.6 01/18/2020   PLT 100 (L) 01/18/2020      Chemistry      Component Value Date/Time   NA 132 (L) 01/18/2020 1315   K 4.1 01/18/2020 1315   CL 97 (L) 01/18/2020 1315   CO2 25 01/18/2020 1315   BUN 15 01/18/2020 1315   CREATININE 0.72 01/18/2020 1315      Component Value Date/Time   CALCIUM 8.8 (L) 01/18/2020 1315   ALKPHOS 51 01/18/2020 1315   AST 51 (H) 01/18/2020 1315   ALT 24 01/18/2020 1315   BILITOT 1.0 01/18/2020 1315       RADIOGRAPHIC STUDIES: I have personally reviewed the radiological images as listed and agreed with the findings in the report. No results found.   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  STABLE; .  Mammogram December 2020-within normal limits. STABLE; ;Off AI because of intolerance.   # Anemia-stable.  Hemoglobin 11.7 s/p IV iron infusion-unclear etiology/underlying cirrhosis;  IDA-Recommend IV iron.   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  ANC-1.2 Platelets 100-STABLE.   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 AFP normal.  # right lower thoracic pain- 6 months; positional/night- improved with tylenol. Await X-rays.   # DISPOSITION: will call with results.  # Venofer today;  # follow up in 4 months-MD-cbc/cmp/iron studies/ferrtin/AFP- Possible Venofer; US liver prior- -Dr.B  Addendum: Patient's back x-rays negative for any acute process. Suggestive of arthritis. Recommend topical NSAIDs. If not improved follow-up with PCP.  Dr. Hall Busing   Orders Placed This Encounter   Procedures  . US ABDOMEN LIMITED RUQ (LIVER/GB)    Standing Status:   Future    Standing Expiration Date:   01/17/2021    Order Specific Question:   Reason for Exam (SYMPTOM  OR DIAGNOSIS REQUIRED)    Answer:   cirrhosis; [place] surveillaince    Order Specific Question:   Preferred imaging location?    Answer:   [facility]  . CBC with Differential    Standing Status:   Future    Number of Occurrences:   1    Standing Expiration Date:   01/17/2021  . Comprehensive metabolic panel    Standing Status:   Future    Number of Occurrences:   1    Standing Expiration Date:   01/17/2021  . Iron and TIBC    Standing Status:   Future    Number of Occurrences:   1    Standing Expiration Date:   01/17/2021  . Ferritin    Standing Status:   Future    Number of Occurrences:   1    Standing Expiration Date:   01/17/2021  . AFP tumor marker    Standing Status:   Future    Number of Occurrences:   1    Standing Expiration Date:   01/17/2021   All questions were answered. The patient knows to call the clinic with any problems, questions or concerns.      Cammie Sickle, MD 01/22/2020 6:19 AM

## 2020-01-18 NOTE — Progress Notes (Signed)
Pt in for follow up, denies any concerns today.

## 2020-01-18 NOTE — Progress Notes (Signed)
Patient tolerated infusion well. Patient and VSS. Discharged home.

## 2020-01-18 NOTE — Assessment & Plan Note (Addendum)
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  STABLE; .  Mammogram December 2020-within normal limits. STABLE; ;Off AI because of intolerance.   # Anemia-stable.  Hemoglobin 11.7 s/p IV iron infusion-unclear etiology/underlying cirrhosis; IDA-Recommend IV iron.   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  ANC-1.2 Platelets 100-STABLE.   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 AFP normal.  # right lower thoracic pain- 6 months; positional/night- improved with tylenol. Await X-rays.   # DISPOSITION: will call with results.  # Venofer today;  # follow up in 4 months-MD-cbc/cmp/iron studies/ferrtin/AFP- Possible Venofer; US liver prior- -Dr.B  Addendum: Patient's back x-rays negative for any acute process. Suggestive of arthritis. Recommend topical NSAIDs. If not improved follow-up with PCP. Reviewed my recommendations with the patient. She is in agreement.  Dr. Hall Busing

## 2020-01-18 NOTE — Patient Instructions (Signed)
X-rays today.

## 2020-01-19 LAB — AFP TUMOR MARKER: AFP, Serum, Tumor Marker: 2.5 ng/mL (ref 0.0–8.3)

## 2020-01-21 ENCOUNTER — Telehealth: Payer: Self-pay | Admitting: Internal Medicine

## 2020-01-21 ENCOUNTER — Other Ambulatory Visit: Payer: Medicare PPO

## 2020-01-21 NOTE — Telephone Encounter (Signed)
On 12/09-spoke to patient regarding results of the x-rays shows arthritis otherwise no significant fractures to explain her back pain.  Recommend topical NSAIDs.  If not improved recommend follow-up with PCP for further work-up/physical therapy etc.  Patient agreement.  Follow-up as planned

## 2020-01-27 ENCOUNTER — Encounter: Payer: Self-pay | Admitting: Podiatry

## 2020-01-27 ENCOUNTER — Other Ambulatory Visit: Payer: Self-pay

## 2020-01-27 ENCOUNTER — Ambulatory Visit (INDEPENDENT_AMBULATORY_CARE_PROVIDER_SITE_OTHER): Payer: Medicare PPO | Admitting: Podiatry

## 2020-01-27 DIAGNOSIS — M2041 Other hammer toe(s) (acquired), right foot: Secondary | ICD-10-CM | POA: Diagnosis not present

## 2020-01-27 DIAGNOSIS — M2042 Other hammer toe(s) (acquired), left foot: Secondary | ICD-10-CM

## 2020-01-27 DIAGNOSIS — M79674 Pain in right toe(s): Secondary | ICD-10-CM

## 2020-01-27 DIAGNOSIS — B351 Tinea unguium: Secondary | ICD-10-CM

## 2020-01-27 DIAGNOSIS — M2010 Hallux valgus (acquired), unspecified foot: Secondary | ICD-10-CM | POA: Diagnosis not present

## 2020-01-27 DIAGNOSIS — E1142 Type 2 diabetes mellitus with diabetic polyneuropathy: Secondary | ICD-10-CM | POA: Diagnosis not present

## 2020-01-27 DIAGNOSIS — M79675 Pain in left toe(s): Secondary | ICD-10-CM

## 2020-01-27 NOTE — Progress Notes (Signed)
This patient returns to my office for at risk foot care.  This patient requires this care by a professional since this patient will be at risk due to having diabetes  This patient is unable to cut nails himself since the patient cannot reach his nails.These nails are painful walking and wearing shoes.  This patient presents for at risk foot care today.    General Appearance  Alert, conversant and in no acute stress.  Vascular  Dorsalis pedis and posterior tibial  pulses are palpable  bilaterally.  Capillary return is within normal limits  bilaterally. Temperature is within normal limits  bilaterally.  Neurologic  Senn-Weinstein monofilament test dimininished  bilaterally. Muscle power within normal limits bilaterally.  Nails Thick disfigured discolored nails with subungual debris  from hallux to fifth toes bilaterally. No evidence of bacterial infection or drainage bilaterally.  Orthopedic  No limitations of motion  feet .  No crepitus or effusions noted.  No bony pathology or digital deformities noted. HAV  B/L.  Hammer toes  B/L.  Midfoot DJD  B/L.  Skin  normotropic skin with no porokeratosis noted bilaterally.  No signs of infections or ulcers noted.     Onychomycosis  Pain in right toes  Pain in left toes  Consent was obtained for treatment procedures.   Mechanical debridement of nails 1-5  bilaterally performed with a nail nipper.  Filed with dremel without incident.     Return office visit    10 weeks                 Told patient to return for periodic foot care and evaluation due to potential at risk complications.   Gardiner Barefoot DPM

## 2020-01-31 ENCOUNTER — Other Ambulatory Visit: Payer: Self-pay

## 2020-01-31 ENCOUNTER — Ambulatory Visit
Admission: RE | Admit: 2020-01-31 | Discharge: 2020-01-31 | Disposition: A | Discharge status: Home / Self Care | Payer: Medicare PPO | Source: Ambulatory Visit | Attending: General Surgery | Admitting: General Surgery

## 2020-01-31 DIAGNOSIS — Z853 Personal history of malignant neoplasm of breast: Secondary | ICD-10-CM | POA: Diagnosis present

## 2020-02-07 ENCOUNTER — Observation Stay
Admission: EM | Admit: 2020-02-07 | Discharge: 2020-02-08 | Disposition: A | Discharge status: Home / Self Care | Payer: Medicare PPO | Attending: Internal Medicine | Admitting: Internal Medicine

## 2020-02-07 ENCOUNTER — Emergency Department: Payer: Medicare PPO

## 2020-02-07 ENCOUNTER — Other Ambulatory Visit: Payer: Self-pay

## 2020-02-07 DIAGNOSIS — K7581 Nonalcoholic steatohepatitis (NASH): Secondary | ICD-10-CM | POA: Insufficient documentation

## 2020-02-07 DIAGNOSIS — I509 Heart failure, unspecified: Secondary | ICD-10-CM | POA: Insufficient documentation

## 2020-02-07 DIAGNOSIS — E119 Type 2 diabetes mellitus without complications: Secondary | ICD-10-CM | POA: Insufficient documentation

## 2020-02-07 DIAGNOSIS — I110 Hypertensive heart disease with heart failure: Secondary | ICD-10-CM | POA: Insufficient documentation

## 2020-02-07 DIAGNOSIS — I4891 Unspecified atrial fibrillation: Principal | ICD-10-CM | POA: Insufficient documentation

## 2020-02-07 DIAGNOSIS — E039 Hypothyroidism, unspecified: Secondary | ICD-10-CM | POA: Insufficient documentation

## 2020-02-07 DIAGNOSIS — Z79899 Other long term (current) drug therapy: Secondary | ICD-10-CM | POA: Diagnosis not present

## 2020-02-07 DIAGNOSIS — Z20822 Contact with and (suspected) exposure to covid-19: Secondary | ICD-10-CM | POA: Diagnosis not present

## 2020-02-07 DIAGNOSIS — R002 Palpitations: Secondary | ICD-10-CM | POA: Diagnosis present

## 2020-02-07 DIAGNOSIS — G4733 Obstructive sleep apnea (adult) (pediatric): Secondary | ICD-10-CM | POA: Diagnosis not present

## 2020-02-07 DIAGNOSIS — Z7984 Long term (current) use of oral hypoglycemic drugs: Secondary | ICD-10-CM | POA: Insufficient documentation

## 2020-02-07 DIAGNOSIS — Z794 Long term (current) use of insulin: Secondary | ICD-10-CM | POA: Insufficient documentation

## 2020-02-07 DIAGNOSIS — Z7982 Long term (current) use of aspirin: Secondary | ICD-10-CM | POA: Diagnosis not present

## 2020-02-07 LAB — CBC WITH DIFFERENTIAL/PLATELET
Abs Immature Granulocytes: 0 10*3/uL (ref 0.00–0.07)
Basophils Absolute: 0 10*3/uL (ref 0.0–0.1)
Basophils Relative: 0 %
Eosinophils Absolute: 0.1 10*3/uL (ref 0.0–0.5)
Eosinophils Relative: 4 %
HCT: 35.3 % — ABNORMAL LOW (ref 36.0–46.0)
Hemoglobin: 11.4 g/dL — ABNORMAL LOW (ref 12.0–15.0)
Immature Granulocytes: 0 %
Lymphocytes Relative: 29 %
Lymphs Abs: 0.9 10*3/uL (ref 0.7–4.0)
MCH: 30.5 pg (ref 26.0–34.0)
MCHC: 32.3 g/dL (ref 30.0–36.0)
MCV: 94.4 fL (ref 80.0–100.0)
Monocytes Absolute: 0.4 10*3/uL (ref 0.1–1.0)
Monocytes Relative: 12 %
Neutro Abs: 1.8 10*3/uL (ref 1.7–7.7)
Neutrophils Relative %: 55 %
Platelets: 102 10*3/uL — ABNORMAL LOW (ref 150–400)
RBC: 3.74 MIL/uL — ABNORMAL LOW (ref 3.87–5.11)
RDW: 14.3 % (ref 11.5–15.5)
WBC: 3.2 10*3/uL — ABNORMAL LOW (ref 4.0–10.5)
nRBC: 0 % (ref 0.0–0.2)

## 2020-02-07 LAB — HEMOGLOBIN A1C
Hgb A1c MFr Bld: 9.1 % — ABNORMAL HIGH (ref 4.8–5.6)
Mean Plasma Glucose: 214.47 mg/dL

## 2020-02-07 LAB — COMPREHENSIVE METABOLIC PANEL
ALT: 20 U/L (ref 0–44)
AST: 30 U/L (ref 15–41)
Albumin: 3.8 g/dL (ref 3.5–5.0)
Alkaline Phosphatase: 44 U/L (ref 38–126)
Anion gap: 8 (ref 5–15)
BUN: 17 mg/dL (ref 8–23)
CO2: 28 mmol/L (ref 22–32)
Calcium: 9.2 mg/dL (ref 8.9–10.3)
Chloride: 99 mmol/L (ref 98–111)
Creatinine, Ser: 0.79 mg/dL (ref 0.44–1.00)
GFR, Estimated: >60 mL/min (ref >60)
Glucose, Bld: 230 mg/dL — ABNORMAL HIGH (ref 70–99)
Potassium: 4 mmol/L (ref 3.5–5.1)
Sodium: 135 mmol/L (ref 135–145)
Total Bilirubin: 1 mg/dL (ref 0.3–1.2)
Total Protein: 8 g/dL (ref 6.5–8.1)

## 2020-02-07 LAB — RESP PANEL BY RT-PCR (FLU A&B, COVID) ARPGX2
Influenza A by PCR: NEGATIVE
Influenza B by PCR: NEGATIVE
SARS Coronavirus 2 by RT PCR: NEGATIVE

## 2020-02-07 LAB — TSH: TSH: 3.169 u[IU]/mL (ref 0.350–4.500)

## 2020-02-07 LAB — CBG MONITORING, ED
Glucose-Capillary: 149 mg/dL — ABNORMAL HIGH (ref 70–99)
Glucose-Capillary: 222 mg/dL — ABNORMAL HIGH (ref 70–99)
Glucose-Capillary: 225 mg/dL — ABNORMAL HIGH (ref 70–99)

## 2020-02-07 LAB — BRAIN NATRIURETIC PEPTIDE: B Natriuretic Peptide: 312.1 pg/mL — ABNORMAL HIGH (ref 0.0–100.0)

## 2020-02-07 LAB — TROPONIN I (HIGH SENSITIVITY)
Troponin I (High Sensitivity): 14 ng/L (ref <18)
Troponin I (High Sensitivity): 14 ng/L (ref <18)

## 2020-02-07 LAB — PROTIME-INR
INR: 1.3 — ABNORMAL HIGH (ref 0.8–1.2)
Prothrombin Time: 15.4 seconds — ABNORMAL HIGH (ref 11.4–15.2)

## 2020-02-07 LAB — APTT: aPTT: 30 seconds (ref 24–36)

## 2020-02-07 LAB — MAGNESIUM: Magnesium: 2.3 mg/dL (ref 1.7–2.4)

## 2020-02-07 MED ORDER — INSULIN DEGLUDEC 100 UNIT/ML ~~LOC~~ SOPN
50.0000 [IU] | PEN_INJECTOR | Freq: Every day | SUBCUTANEOUS | Status: DC
  Filled 2020-02-07: qty 3

## 2020-02-07 MED ORDER — ONDANSETRON HCL 4 MG/2ML IJ SOLN: 4.0000 mg | Freq: Four times a day (QID) | INTRAMUSCULAR | Status: DC | PRN

## 2020-02-07 MED ORDER — FUROSEMIDE 10 MG/ML IJ SOLN
20.0000 mg | Freq: Two times a day (BID) | INTRAMUSCULAR | Status: DC
  Administered 2020-02-07 – 2020-02-08 (×2): 20 mg via INTRAVENOUS
  Filled 2020-02-07 (×2): qty 4

## 2020-02-07 MED ORDER — METOPROLOL TARTRATE 25 MG PO TABS
12.5000 mg | ORAL_TABLET | Freq: Four times a day (QID) | ORAL | Status: DC
  Administered 2020-02-07 – 2020-02-08 (×4): 12.5 mg via ORAL
  Filled 2020-02-07 (×4): qty 1

## 2020-02-07 MED ORDER — GABAPENTIN 300 MG PO CAPS
300.0000 mg | ORAL_CAPSULE | Freq: Every day | ORAL | Status: DC
  Administered 2020-02-07: 300 mg via ORAL
  Filled 2020-02-07: qty 1

## 2020-02-07 MED ORDER — INSULIN DEGLUDEC 100 UNIT/ML ~~LOC~~ SOPN: 50.0000 [IU] | PEN_INJECTOR | Freq: Every day | SUBCUTANEOUS | Status: DC

## 2020-02-07 MED ORDER — DILTIAZEM LOAD VIA INFUSION
10.0000 mg | Freq: Once | INTRAVENOUS | Status: AC
  Administered 2020-02-07: 10 mg via INTRAVENOUS
  Filled 2020-02-07: qty 10

## 2020-02-07 MED ORDER — PANTOPRAZOLE SODIUM 40 MG PO TBEC
40.0000 mg | DELAYED_RELEASE_TABLET | Freq: Every day | ORAL | Status: DC
  Administered 2020-02-07 – 2020-02-08 (×2): 40 mg via ORAL
  Filled 2020-02-07 (×2): qty 1

## 2020-02-07 MED ORDER — SIMVASTATIN 10 MG PO TABS
20.0000 mg | ORAL_TABLET | Freq: Every day | ORAL | Status: DC
  Administered 2020-02-07: 20 mg via ORAL
  Filled 2020-02-07: qty 2

## 2020-02-07 MED ORDER — INSULIN ASPART 100 UNIT/ML ~~LOC~~ SOLN
0.0000 [IU] | Freq: Three times a day (TID) | SUBCUTANEOUS | Status: DC
  Administered 2020-02-07 – 2020-02-08 (×2): 5 [IU] via SUBCUTANEOUS
  Filled 2020-02-07 (×2): qty 1

## 2020-02-07 MED ORDER — ASPIRIN 81 MG PO CHEW
81.0000 mg | CHEWABLE_TABLET | Freq: Every day | ORAL | Status: DC
  Administered 2020-02-07: 81 mg via ORAL
  Filled 2020-02-07: qty 1

## 2020-02-07 MED ORDER — ACETAMINOPHEN 650 MG RE SUPP: 650.0000 mg | Freq: Four times a day (QID) | RECTAL | Status: DC | PRN

## 2020-02-07 MED ORDER — HEPARIN BOLUS VIA INFUSION
3400.0000 [IU] | Freq: Once | INTRAVENOUS | Status: AC
  Administered 2020-02-07: 3400 [IU] via INTRAVENOUS
  Filled 2020-02-07: qty 3400

## 2020-02-07 MED ORDER — POLYETHYLENE GLYCOL 3350 17 G PO PACK: 17.0000 g | PACK | Freq: Every day | ORAL | Status: DC | PRN

## 2020-02-07 MED ORDER — ONDANSETRON HCL 4 MG PO TABS: 4.0000 mg | ORAL_TABLET | Freq: Four times a day (QID) | ORAL | Status: DC | PRN

## 2020-02-07 MED ORDER — INSULIN GLARGINE 100 UNIT/ML ~~LOC~~ SOLN
50.0000 [IU] | Freq: Every day | SUBCUTANEOUS | Status: DC
  Administered 2020-02-07: 50 [IU] via SUBCUTANEOUS
  Filled 2020-02-07 (×2): qty 0.5

## 2020-02-07 MED ORDER — INSULIN ASPART 100 UNIT/ML ~~LOC~~ SOLN
0.0000 [IU] | Freq: Every day | SUBCUTANEOUS | Status: DC
  Administered 2020-02-07: 2 [IU] via SUBCUTANEOUS

## 2020-02-07 MED ORDER — ESCITALOPRAM OXALATE 10 MG PO TABS
5.0000 mg | ORAL_TABLET | Freq: Every day | ORAL | Status: DC
  Administered 2020-02-07: 5 mg via ORAL
  Filled 2020-02-07: qty 1

## 2020-02-07 MED ORDER — HEPARIN (PORCINE) 25000 UT/250ML-% IV SOLN
1100.0000 [IU]/h | INTRAVENOUS | Status: DC
  Administered 2020-02-07: 1100 [IU]/h via INTRAVENOUS
  Filled 2020-02-07: qty 250

## 2020-02-07 MED ORDER — DILTIAZEM HCL-DEXTROSE 125-5 MG/125ML-% IV SOLN (PREMIX)
5.0000 mg/h | INTRAVENOUS | Status: DC
  Administered 2020-02-07: 5 mg/h via INTRAVENOUS
  Filled 2020-02-07: qty 125

## 2020-02-07 MED ORDER — ACETAMINOPHEN 325 MG PO TABS: 650.0000 mg | ORAL_TABLET | Freq: Four times a day (QID) | ORAL | Status: DC | PRN

## 2020-02-07 MED ORDER — INSULIN ASPART 100 UNIT/ML ~~LOC~~ SOLN
0.0000 [IU] | SUBCUTANEOUS | Status: DC
  Administered 2020-02-07: 2 [IU] via SUBCUTANEOUS
  Filled 2020-02-07: qty 1

## 2020-02-07 NOTE — ED Provider Notes (Signed)
Patient  [facility] Emergency Department Provider Note  ____________________________________________   Event Date/Time   First MD Initiated Contact with Patient 02/07/20 1448     (approximate)  I have reviewed the triage vital signs and the nursing notes.   HISTORY  Chief Complaint Chest Pain   HPI Annette Foster is a 79 y.o. female with a past medical history of HTN, HDL, hypothyroidism, DM, CHF, IDA, IBS, NASH, and chronic diarrhea who presents for assessment of intermittent palpitations that have become more persistent associate with some shortness of breath and chest tightness that initially began on 11/23.  Patient denies any current chest pain, fevers, chills, headache, cough, back pain, abdominal pain, vomiting, urinary symptoms, change in her chronic loose stools, rash or extremity pain.  No prior similar episodes.  No clear alleviating aggravating factors.  She has never had A. fib before any known coronary artery disease.  She is not taking any blood thinners.  She does not smoke or use any illegal drugs.  No recent significant alcohol use.         Past Medical History:  Diagnosis Date  . Abdominal pain   . Allergy   . Cancer [facility]) 2017   breast cancer- Left  . Cataract   . CHF (congestive heart failure) ([place])   . Collagenous colitis   . Diabetes mellitus without complication ([place])   . Diarrhea   . Diverticulosis   . Fatty liver disease, nonalcoholic   . Fibrocystic breast   . GERD (gastroesophageal reflux disease)   . Heart murmur   . Hyperlipidemia   . Hypertension   . Hypothyroidism   . IBS (irritable bowel syndrome)   . IDA (iron deficiency anemia)   . Personal history of radiation therapy 2017   LEFT BREAST CA  . PONV (postoperative nausea and vomiting)   . Sleep apnea    C-Pap    Patient Active Problem List   Diagnosis Date Noted  . Atrial fibrillation with RVR ([place]) 02/07/2020  . NASH (nonalcoholic steatohepatitis)    . Hav (hallux abducto valgus), unspecified laterality 06/03/2019  . Acquired bilateral hammer toes 06/03/2019  . Iron deficiency 04/26/2019  . Right-sided chest wall pain 04/12/2019  . Mid back pain on right side 04/12/2019  . 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 Recurrent UTI (urinary tract infection) 07/23/2017  . Nausea vomiting and diarrhea 01/11/2017  . Hypothyroidism 01/11/2017  . HTN (hypertension) 01/11/2017  . Diabetes ([place]) 01/11/2017  . GERD (gastroesophageal reflux disease) 01/11/2017  . Other pancytopenia ([place]) 11/14/2016  . 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 Status post hysterectomy 06/20/2016  . 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 Vaginal atrophy 01/12/2016  . 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 ABDOMINAL HYSTERECTOMY     [place] bso  . ABDOMINAL SURGERY    . APPENDECTOMY    . BREAST BIOPSY Bilateral 2016   negative  . 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 BREAST BIOPSY Left 05/27/2016   BENIGN MAMMARY EPITHELIUM  . BREAST BIOPSY Left 11/20/2017   affirm bx x clip BENIGN MAMMARY EPITHELIUM CONSISTENT WITH RAD THERAPY  . BREAST LUMPECTOMY Left 10/17/2015   DCIS and papillary carcinoma insitu, clear margins  . CARDIAC SURGERY     has replacement valve  . CATARACT EXTRACTION Right   .  CHOLECYSTECTOMY    . COLONOSCOPY WITH PROPOFOL N/A 03/11/2016   Procedure: COLONOSCOPY WITH PROPOFOL;  Surgeon: Manya Silvas, MD;  Location: [facility] ENDOSCOPY;  Service: Endoscopy;  Laterality: N/A;  . ESOPHAGOGASTRODUODENOSCOPY (EGD) WITH PROPOFOL N/A 03/11/2016   Procedure: ESOPHAGOGASTRODUODENOSCOPY (EGD) WITH PROPOFOL;  Surgeon: Manya Silvas, MD;  Location: [facility] ENDOSCOPY;  Service: Endoscopy;  Laterality: N/A;  . FINGER ARTHROSCOPY WITH CARPOMETACARPEL ([place]) ARTHROPLASTY  Right 09/03/2018   Procedure: CARPOMETACARPEL [facility]) ARTHROPLASTY RIGHT THUMB;  Surgeon: Hessie Knows, MD;  Location: [facility] ORS;  Service: Orthopedics;  Laterality: Right;  . GANGLION CYST EXCISION Right 09/03/2018   Procedure: REMOVAL GANGLION OF WRIST;  Surgeon: Hessie Knows, MD;  Location: [facility] ORS;  Service: Orthopedics;  Laterality: Right;  . HARDWARE REMOVAL Right 09/03/2018   Procedure: HARDWARE REMOVAL RIGHT THUMB;  Surgeon: Hessie Knows, MD;  Location: [facility] ORS;  Service: Orthopedics;  Laterality: Right;  staple removed  . JOINT REPLACEMENT Left    TKR  . left sinusplasty     . MASTECTOMY, PARTIAL Left 10/17/2015   Procedure: MASTECTOMY PARTIAL REVISION;  Surgeon: Leonie Green, MD;  Location: [facility] ORS;  Service: General;  Laterality: Left;  . PARTIAL MASTECTOMY WITH NEEDLE LOCALIZATION Left 09/29/2015   Procedure: PARTIAL MASTECTOMY WITH NEEDLE LOCALIZATION;  Surgeon: Leonie Green, MD;  Location: [facility] ORS;  Service: General;  Laterality: Left;  . TOTAL ABDOMINAL HYSTERECTOMY W/ BILATERAL SALPINGOOPHORECTOMY      Prior to Admission medications   Medication Sig Start Date End Date Taking? Authorizing Provider  aspirin 81 MG tablet Take 81 mg by mouth at bedtime.    [provider]  benzonatate (TESSALON) 200 MG capsule Take 200 mg by mouth 3 (three) times daily as needed for cough.    [provider]  budesonide-formoterol (SYMBICORT) 80-4.5 MCG/ACT inhaler Inhale 2 puffs into the lungs 2 (two) times daily as needed (shortness of breath).  Patient not taking: Reported on 01/18/2020    [provider]  Cholecalciferol (VITAMIN D) 50 MCG (2000 UT) CAPS Take 2,000 Units by mouth daily.    [provider]  clobetasol ointment (TEMOVATE) 8.29 % Apply 1 application topically 2 (two) times a week. Patient not taking: Reported on 01/18/2020 11/18/19   Harlin Heys, MD  cyanocobalamin (,VITAMIN B-12,) 1000 MCG/ML injection  04/23/19   [provider]  Cyanocobalamin (B-12 COMPLIANCE INJECTION) 1000 MCG/ML KIT Inject 1,000 mcg as directed every 30 (thirty) days.     [provider]  desloratadine (CLARINEX) 5 MG tablet Take 5 mg by mouth daily. Patient not taking: Reported on 01/18/2020 06/18/19   [provider]  dicyclomine (BENTYL) 10 MG capsule Take 10 mg by mouth at bedtime.  05/16/19   [provider]  EPINEPHrine 0.3 mg/0.3 mL IJ SOAJ injection  05/18/19   [provider]  escitalopram (LEXAPRO) 5 MG tablet Take 5 mg by mouth at bedtime.    [provider]  esomeprazole (NEXIUM) 40 MG capsule Take 40 mg by mouth 2 (two) times daily before a meal.  02/08/16   [provider]  estradiol (ESTRACE) 0.1 MG/GM vaginal cream Place 9.37 Applicatorfuls vaginally 2 (two) times a week. Patient not taking: Reported on 01/18/2020 08/19/19 08/18/20  Harlin Heys, MD  fenofibrate 160 MG tablet Take 160 mg by mouth at bedtime.    [provider]  fluticasone (FLONASE) 50 MCG/ACT nasal spray Place 1 spray into the nose daily as needed for allergies.  Patient not taking: Reported  on 01/18/2020    [provider]  furosemide (LASIX) 20 MG tablet Take 20-40 mg by mouth See admin instructions. Take once a day. alternate taking 19m and 473m    [provider]  gabapentin (NEURONTIN) 300 MG capsule Take 300 mg by mouth at bedtime.  09/07/16 10/25/36  [provider]  glimepiride (AMARYL) 2 MG tablet Take 2 mg by mouth at bedtime.    [provider]  Hyoscyamine Sulfate 0.375 MG TBCR Take 0.375 mg by mouth 2 (two) times daily.     [provider]  insulin aspart (NOVOLOG) 100 UNIT/ML FlexPen Inject into the skin.    [provider]  Insulin Degludec (TRESIBA) 100 UNIT/ML SOLN Inject 50 mg into the skin at bedtime.    [provider]  levothyroxine (SYNTHROID, LEVOTHROID) 75 MCG tablet Take 75 mcg by mouth daily before breakfast.      [provider]  magnesium oxide (MAG-OX) 400 MG tablet Take 400 mg by mouth 2 (two) times daily.     [provider]  metFORMIN (GLUCOPHAGE-XR) 750 MG 24 hr tablet Take 750 mg by mouth daily with breakfast.     [provider]  NOVOLOG FLEXPEN 100 UNIT/ML FlexPen Based on sliding scale 08/14/19   [provider]  olopatadine (PATANOL) 0.1 % ophthalmic solution Place 1 drop into both eyes 2 (two) times daily as needed for allergies.  Patient not taking: Reported on 01/18/2020    [provider]  ondansetron (ZOFRAN) 4 MG tablet Take 4 mg by mouth every 8 (eight) hours as needed for nausea or vomiting.  Patient not taking: Reported on 01/18/2020 01/06/17   [provider]  potassium chloride SA (K-DUR,KLOR-CON) 20 MEQ tablet Take 20 mEq by mouth 2 (two) times daily.     [provider]  saccharomyces boulardii (FLORASTOR) 250 MG capsule Take 250 mg by mouth 2 (two) times daily.     [provider]  Semaglutide,0.25 or 0.5MG/DOS, 2 MG/1.5ML SOPN Inject into the skin. 11/22/19   [provider]  simvastatin (ZOCOR) 20 MG tablet Take 20 mg by mouth at bedtime.    [provider]  valACYclovir (VALTREX) 500 MG tablet Take 500 mg by mouth See admin instructions. For fever blisters take 50017mwice a day. Patient not taking: Reported on 01/18/2020 01/06/17   [provider]  valsartan (DIOVAN) 160 MG tablet Take 160 mg by mouth every morning.     [provider]    Allergies Codeine, Demeclocycline, Demerol [meperidine], Hydrocodone, Oxycodone, Pentazocine, Tetracyclines & related, and Fentanyl  Family History  Problem Relation Age of Onset  . Breast cancer Paternal Grandmother   . Colon cancer Father   . Diabetes Sister   . Diabetes Brother   . Heart disease Brother   . Prostate cancer Brother   . Colon cancer Maternal Uncle   . Prostate cancer Brother   . Bladder Cancer Brother   .  Leukemia Mother        all  . Ovarian cancer Neg Hx   . Kidney cancer Neg Hx     Social History Social History   Tobacco Use  . Smoking status: Never Smoker  . Smokeless tobacco: Never Used  Vaping Use  . Vaping Use: Never used  Substance Use Topics  . Alcohol use: No    Alcohol/week: 0.0 standard drinks  . Drug use: No    Review of Systems  Review of Systems  Constitutional: Positive for malaise/fatigue.  Negative for chills and fever.  HENT: Negative for sore throat.   Eyes: Negative for pain.  Respiratory: Positive for shortness of breath. Negative for cough and stridor.   Cardiovascular: Positive for palpitations. Negative for chest pain.  Gastrointestinal: Negative for vomiting.  Genitourinary: Negative for dysuria.  Musculoskeletal: Negative for myalgias.  Skin: Negative for rash.  Neurological: Negative for seizures, loss of consciousness and headaches.  Psychiatric/Behavioral: Negative for suicidal ideas.  All other systems reviewed and are negative.     ____________________________________________   PHYSICAL EXAM:  VITAL SIGNS: ED Triage Vitals [02/07/20 1431]  Enc Vitals Group     BP (!) 121/98     Pulse Rate (!) 138     Resp 18     Temp 98.7 F (37.1 C)     Temp Source Oral     SpO2 98 %     Weight 165 lb (74.8 kg)     Height 5' 3"  (1.6 m)     Head Circumference      Peak Flow      Pain Score 0     Pain Loc      Pain Edu?      Excl. in [place]?    Vitals:   02/07/20 1900 02/07/20 1915  BP: 110/77 105/72  Pulse: 73 (!) 114  Resp: (!) 21   Temp:    SpO2: 95%    Physical Exam Vitals and nursing note reviewed.  Constitutional:      General: She is not in acute distress.    Appearance: She is well-developed and well-nourished.  HENT:     Head: Normocephalic and atraumatic.     Right Ear: External ear normal.     Left Ear: External ear normal.     Nose: Nose normal.     Mouth/Throat:     Mouth: Mucous membranes are moist.  Eyes:      Conjunctiva/sclera: Conjunctivae normal.  Cardiovascular:     Rate and Rhythm: Tachycardia present. Rhythm irregular.     Heart sounds: No murmur heard.   Pulmonary:     Effort: Pulmonary effort is normal. No respiratory distress.     Breath sounds: Normal breath sounds.  Abdominal:     Palpations: Abdomen is soft.     Tenderness: There is no abdominal tenderness.  Musculoskeletal:        General: No edema.     Cervical back: Neck supple.     Right lower leg: No edema.     Left lower leg: No edema.  Skin:    General: Skin is warm and dry.     Capillary Refill: Capillary refill takes less than 2 seconds.  Neurological:     Mental Status: She is alert and oriented to person, place, and time.  Psychiatric:        Mood and Affect: Mood and affect and mood normal.      ____________________________________________   LABS (all labs ordered are listed, but only abnormal results are displayed)  Labs Reviewed  CBC WITH DIFFERENTIAL/PLATELET - Abnormal; Notable for the following components:      Result Value   WBC 3.2 (*)    RBC 3.74 (*)    Hemoglobin 11.4 (*)    HCT 35.3 (*)    Platelets 102 (*)    All other components within normal limits  COMPREHENSIVE METABOLIC PANEL - Abnormal; Notable for the following components:   Glucose, Bld 230 (*)    All other components within normal limits  BRAIN NATRIURETIC PEPTIDE - Abnormal; Notable for the following components:   B Natriuretic Peptide 312.1 (*)    All other components within normal limits  PROTIME-INR - Abnormal; Notable for the following components:   Prothrombin Time 15.4 (*)    INR 1.3 (*)    All other components within normal limits  CBG MONITORING, ED - Abnormal; Notable for the following components:   Glucose-Capillary 149 (*)    All other components within normal limits  RESP PANEL BY RT-PCR (FLU A&B, COVID) ARPGX2  MAGNESIUM  TSH  APTT  HEMOGLOBIN Z6X  BASIC METABOLIC PANEL  CBC  HEPARIN LEVEL  (UNFRACTIONATED)  TROPONIN I (HIGH SENSITIVITY)  TROPONIN I (HIGH SENSITIVITY)   ____________________________________________  EKG  A. fib with rapid ventricular rate with a ventricular rate of 138, normal axis, unremarkable intervals, and diffuse ST changes.  ____________________________________________  RADIOLOGY  ED MD interpretation: Mild edema without clear focal consolidation, large effusion, pneumothorax or other clear acute intrathoracic process.  Official radiology report(s): DG Chest 1 View  Result Date: 02/07/2020 CLINICAL DATA:  Chest pain. EXAM: CHEST  1 VIEW COMPARISON:  February 19, 2016. FINDINGS: Stable cardiomediastinal silhouette. No pneumothorax or pleural effusion is noted. Mild central pulmonary vascular congestion is noted. Bilateral interstitial densities are noted concerning for edema or possibly atypical inflammation. Bony thorax is unremarkable. IMPRESSION: Mild central pulmonary vascular congestion. Bilateral interstitial densities are noted concerning for edema or possibly atypical inflammation. Electronically Signed   By: Marijo Conception M.D.   On: 02/07/2020 15:28    ____________________________________________   PROCEDURES  Procedure(s) performed (including Critical Care):  .Critical Care Performed by: Lucrezia Starch, MD Authorized by: Lucrezia Starch, MD   Critical care provider statement:    Critical care time (minutes):  45   Critical care time was exclusive of:  Separately billable procedures and treating other patients   Critical care was necessary to treat or prevent imminent or life-threatening deterioration of the following conditions:  Cardiac failure   Critical care was time spent personally by me on the following activities:  Discussions with consultants, evaluation of patient's response to treatment, examination of patient, ordering and performing treatments and interventions, ordering and review of laboratory studies, ordering and  review of radiographic studies, pulse oximetry, re-evaluation of patient's condition, obtaining history from patient or surrogate and review of old charts     ____________________________________________   INITIAL IMPRESSION / [place] / ED COURSE      Patient presents with above history exam for assessment of chest tightness from getting worse the last couple days.  On arrival patient is afebrile and noted to have a heart rate in the 130s with stable blood pressure and normal SPO2.  ECG obtained on arrival shows evidence of A. fib with RVR with a rate of 138.  Chest x-ray shows no evidence of pneumothorax, pneumonia, effusion although there is some mild edema.  Magnesium is unremarkable.  BNP is slightly elevated at 312.  TSH is WNL.  Low suspicion for thyrotoxicosis.  Initial troponin is nonelevated and have low suspicion for ACS at this time.  CBC shows mild leukopenia with no evidence of acute anemia.  CMP shows evidence of hyperglycemia with a glucose of 230 without evidence of acidosis or significant electrolyte or metabolic derangements.  Covid is negative.  I did discuss patient's presentation with on-call cardiologist Dr. And who agreed with loading the patient with diltiazem and placing on a diltiazem drip.  Patient will be  admitted to medicine service for further evaluation management.      ____________________________________________   FINAL CLINICAL IMPRESSION(S) / ED DIAGNOSES  Final diagnoses:  NASH (nonalcoholic steatohepatitis)  Atrial fibrillation with RVR (HCC)    Medications  diltiazem (CARDIZEM) 1 mg/mL load via infusion 10 mg (10 mg Intravenous Bolus from Bag 02/07/20 1511)    And  diltiazem (CARDIZEM) 125 mg in dextrose 5% 125 mL (1 mg/mL) infusion (0 mg/hr Intravenous Stopped 02/07/20 1541)  metoprolol tartrate (LOPRESSOR) tablet 12.5 mg (12.5 mg Oral Given 02/07/20 1733)  furosemide (LASIX) injection 20 mg (20 mg Intravenous Given 02/07/20 1734)   acetaminophen (TYLENOL) tablet 650 mg (has no administration in time range)    Or  acetaminophen (TYLENOL) suppository 650 mg (has no administration in time range)  polyethylene glycol (MIRALAX / GLYCOLAX) packet 17 g (has no administration in time range)  ondansetron (ZOFRAN) tablet 4 mg (has no administration in time range)    Or  ondansetron (ZOFRAN) injection 4 mg (has no administration in time range)  insulin aspart (novoLOG) injection 0-15 Units (0 Units Subcutaneous Not Given 02/07/20 1715)  insulin aspart (novoLOG) injection 0-5 Units (has no administration in time range)  heparin ADULT infusion 100 units/mL (25000 units/269m) (1,100 Units/hr Intravenous New Bag/Given 02/07/20 1741)  aspirin chewable tablet 81 mg (has no administration in time range)  escitalopram (LEXAPRO) tablet 5 mg (has no administration in time range)  pantoprazole (PROTONIX) EC tablet 40 mg (40 mg Oral Given 02/07/20 1745)  gabapentin (NEURONTIN) capsule 300 mg (has no administration in time range)  simvastatin (ZOCOR) tablet 20 mg (has no administration in time range)  insulin glargine (LANTUS) injection 50 Units (has no administration in time range)  heparin bolus via infusion 3,400 Units (3,400 Units Intravenous Bolus from Bag 02/07/20 1742)     ED Discharge Orders    None       Note:  This document was prepared using Dragon voice recognition software and may include unintentional dictation errors.   SLucrezia Starch MD 02/07/20 1944

## 2020-02-07 NOTE — Progress Notes (Signed)
ANTICOAGULATION CONSULT NOTE - Initial Consult  Pharmacy Consult for Heparin  Indication: atrial fibrillation  Allergies  Allergen Reactions  . Codeine Itching and Nausea And Vomiting  . Demeclocycline Rash  . Demerol [Meperidine] Itching and Nausea And Vomiting  . Hydrocodone Itching and Nausea And Vomiting  . Oxycodone Itching and Nausea And Vomiting  . Pentazocine Itching and Nausea And Vomiting  . Tetracyclines & Related Rash  . Fentanyl Itching and Nausea And Vomiting    D/W surgical nurse on 7/23 and pt reportedly tolerated dose during surgery.  Dose was given by MD.  Pt reported that she only gets itching and did not object to receiving med    Patient Measurements: Height: 5' 3"  (160 cm) Weight: 74.8 kg (165 lb) IBW/kg (Calculated) : 52.4 Heparin Dosing Weight: 68.31 kg   Vital Signs: Temp: 98.7 F (37.1 C) (12/27 1431) Temp Source: Oral (12/27 1431) BP: 107/64 (12/27 1630) Pulse Rate: 64 (12/27 1630)  Labs: Recent Labs    02/07/20 1452  HGB 11.4*  HCT 35.3*  PLT 102*  CREATININE 0.79  TROPONINIHS 14    Estimated Creatinine Clearance: 55.3 mL/min (by C-G formula based on SCr of 0.79 mg/dL).   Medical History: Past Medical History:  Diagnosis Date  . Abdominal pain   . Allergy   . Cancer [facility]) 2017   breast cancer- Left  . Cataract   . CHF (congestive heart failure) ([place])   . Collagenous colitis   . Diabetes mellitus without complication ([place])   . Diarrhea   . Diverticulosis   . Fatty liver disease, nonalcoholic   . Fibrocystic breast   . GERD (gastroesophageal reflux disease)   . Heart murmur   . Hyperlipidemia   . Hypertension   . Hypothyroidism   . IBS (irritable bowel syndrome)   . IDA (iron deficiency anemia)   . Personal history of radiation therapy 2017   LEFT BREAST CA  . PONV (postoperative nausea and vomiting)   . Sleep apnea    C-Pap    Medications:  (Not in a hospital admission)   Assessment: Pharmacy consulted to dose  heparin in this 79 year old female admitted with AFib.  No prior anticoag noted.  CrCl = 55.3 ml/min   Goal of Therapy:  Heparin level 0.3-0.7 units/ml Monitor platelets by anticoagulation protocol: Yes   Plan:  Give 3400 units bolus x 1 Start heparin infusion at 1100 units/hr Check anti-Xa level in 8 hours and daily while on heparin Continue to monitor H&H and platelets  Evalyn Shultis D 02/07/2020,5:09 PM

## 2020-02-07 NOTE — Consult Note (Signed)
Cardiology Consultation:   Patient ID: Annette Foster; [id number]; Jun 03, 1940   Admit date: 02/07/2020 Date of Consult: 02/07/2020  Primary Care Provider: Albina Billet, MD Primary Cardiologist: New to [facility] - consult by End Primary Electrophysiologist:  None   Patient Profile:   Annette Foster is a 79 y.o. female with a hx of reported cardiac surgery in the 7564P of uncertain etiology at [facility], reported CHF, left-sided breast cancer status post mastectomy, colitis with intermittent GI bleeding, insulin-dependent diabetes, pancytopenia, HTN, HLD, NASH, iron deficiency anemia requiring iron infusions, seizure disorder, OSA on CPAP, and reflux who is being seen today for the evaluation of new onset atrial flutter with RVR at the request of Dr. Tamala Foster.  History of Present Illness:   [name] reports previously undergoing open cardiac surgery at [facility] in the 1970s with further details being uncertain and unavailable for review.  She also reports a history of CHF with details being unclear.  She was followed by cardiology in [place], [place] up until 2015 when she relocated to the [place] area and has been followed by her PCP.  She was in her usual state of health until 02/03/2020 when she developed onset of intermittent tachypalpitations.  She was concerned that this was A. fib/flutter as her symptoms were similar to what her husband had previously experienced with documented A. fib/flutter.  These palpitations persisted intermittently throughout the Christmas holiday.  Upon waking up on the morning of 12/27 she was still having palpitations and therefore contacted her PCP to be seen.  She was advised to proceed to an urgent care or the ED where she was documented to be in A. fib with RVR with ventricular rates into the 140s bpm.  During this timeframe she has also noted some lower extremity swelling, abdominal distention, increase shortness of breath, and two-pillow orthopnea  (baseline 1 pillow) that dates back to 12/23.  No falls within the past 12 months.  She does have a history of colitis with intermittent GI bleeding and has previously undergone rectal injections through [facility] as recent as 12/2019 with planned repeat procedure next month.  Labs: Initial high-sensitivity troponin 14 with delta pending, BNP 312, TSH normal, magnesium 2.3, potassium 4.0, glucose 230, BUN 17, serum creatinine 0.79, albumin 3.8, AST/ALT normal, Covid and influenza negative, WBC 3.2, Hgb 11.4, PLT 102  Imaging: Chest x-ray with mild central pulmonary vascular congestion with bilateral interstitial densities concerning for edema versus possible atypical inflammation  EKG: A. fib with RVR, 138 bpm, right axis deviation, diffuse ST-T changes possibly rate related   In the ED, she was initially placed on diltiazem drip with improvement in ventricular rate from the 140s to the 80s to low 100s bpm.  However, with relative hypotension diltiazem drip was subsequently discontinued.  Currently, she remains in A. fib with ventricular rates in the 80s to low 100s and denies any palpitations.  Outside of having some brief chest pain while in the ED waiting room she has been without symptoms concerning for chest pain throughout all of the above.    Past Medical History:  Diagnosis Date  . Abdominal pain   . Allergy   . Cancer [facility]) 2017   breast cancer- Left  . Cataract   . CHF (congestive heart failure) ([place])   . Collagenous colitis   . Diabetes mellitus without complication ([place])   . Diarrhea   . Diverticulosis   . Fatty liver disease, nonalcoholic   . Fibrocystic breast   .  GERD (gastroesophageal reflux disease)   . Heart murmur   . Hyperlipidemia   . Hypertension   . Hypothyroidism   . IBS (irritable bowel syndrome)   . IDA (iron deficiency anemia)   . Personal history of radiation therapy 2017   LEFT BREAST CA  . PONV (postoperative nausea and vomiting)   . Sleep apnea    C-Pap     Past Surgical History:  Procedure Laterality Date  . ABDOMINAL HYSTERECTOMY     tah bso  . ABDOMINAL SURGERY    . APPENDECTOMY    . BREAST BIOPSY Bilateral 2016   negative  . 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 BREAST BIOPSY Left 05/27/2016   BENIGN MAMMARY EPITHELIUM  . BREAST BIOPSY Left 11/20/2017   affirm bx x clip BENIGN MAMMARY EPITHELIUM CONSISTENT WITH RAD THERAPY  . BREAST LUMPECTOMY Left 10/17/2015   DCIS and papillary carcinoma insitu, clear margins  . CARDIAC SURGERY     has replacement valve  . CATARACT EXTRACTION Right   . CHOLECYSTECTOMY    . COLONOSCOPY WITH PROPOFOL N/A 03/11/2016   Procedure: COLONOSCOPY WITH PROPOFOL;  Surgeon: Manya Silvas, MD;  Location: [facility] ENDOSCOPY;  Service: Endoscopy;  Laterality: N/A;  . ESOPHAGOGASTRODUODENOSCOPY (EGD) WITH PROPOFOL N/A 03/11/2016   Procedure: ESOPHAGOGASTRODUODENOSCOPY (EGD) WITH PROPOFOL;  Surgeon: Manya Silvas, MD;  Location: [facility] ENDOSCOPY;  Service: Endoscopy;  Laterality: N/A;  . FINGER ARTHROSCOPY WITH CARPOMETACARPEL ([place]) ARTHROPLASTY Right 09/03/2018   Procedure: CARPOMETACARPEL [facility]) ARTHROPLASTY RIGHT THUMB;  Surgeon: Hessie Knows, MD;  Location: [facility] ORS;  Service: Orthopedics;  Laterality: Right;  . GANGLION CYST EXCISION Right 09/03/2018   Procedure: REMOVAL GANGLION OF WRIST;  Surgeon: Hessie Knows, MD;  Location: [facility] ORS;  Service: Orthopedics;  Laterality: Right;  . HARDWARE REMOVAL Right 09/03/2018   Procedure: HARDWARE REMOVAL RIGHT THUMB;  Surgeon: Hessie Knows, MD;  Location: [facility] ORS;  Service: Orthopedics;  Laterality: Right;  staple removed  . JOINT REPLACEMENT Left    TKR  . left sinusplasty     . MASTECTOMY, PARTIAL Left 10/17/2015   Procedure: MASTECTOMY PARTIAL REVISION;  Surgeon: Leonie Green, MD;  Location: [facility] ORS;  Service: General;  Laterality: Left;  . PARTIAL MASTECTOMY WITH NEEDLE LOCALIZATION Left 09/29/2015   Procedure: PARTIAL  MASTECTOMY WITH NEEDLE LOCALIZATION;  Surgeon: Leonie Green, MD;  Location: [facility] ORS;  Service: General;  Laterality: Left;  . TOTAL ABDOMINAL HYSTERECTOMY W/ BILATERAL SALPINGOOPHORECTOMY       Home Meds: Prior to Admission medications   Medication Sig Start Date End Date Taking? Authorizing Provider  aspirin 81 MG tablet Take 81 mg by mouth at bedtime.    [provider]  benzonatate (TESSALON) 200 MG capsule Take 200 mg by mouth 3 (three) times daily as needed for cough.    [provider]  budesonide-formoterol (SYMBICORT) 80-4.5 MCG/ACT inhaler Inhale 2 puffs into the lungs 2 (two) times daily as needed (shortness of breath).  Patient not taking: Reported on 01/18/2020    [provider]  Cholecalciferol (VITAMIN D) 50 MCG (2000 UT) CAPS Take 2,000 Units by mouth daily.    [provider]  clobetasol ointment (TEMOVATE) 5.37 % Apply 1 application topically 2 (two) times a week. Patient not taking: Reported on 01/18/2020 11/18/19   Harlin Heys, MD  cyanocobalamin (,VITAMIN B-12,) 1000 MCG/ML injection  04/23/19   [provider]  Cyanocobalamin (B-12 COMPLIANCE INJECTION) 1000 MCG/ML KIT Inject 1,000 mcg as  directed every 30 (thirty) days.     [provider]  desloratadine (CLARINEX) 5 MG tablet Take 5 mg by mouth daily. Patient not taking: Reported on 01/18/2020 06/18/19   [provider]  dicyclomine (BENTYL) 10 MG capsule Take 10 mg by mouth at bedtime.  05/16/19   [provider]  EPINEPHrine 0.3 mg/0.3 mL IJ SOAJ injection  05/18/19   [provider]  escitalopram (LEXAPRO) 5 MG tablet Take 5 mg by mouth at bedtime.    [provider]  esomeprazole (NEXIUM) 40 MG capsule Take 40 mg by mouth 2 (two) times daily before a meal.  02/08/16   [provider]  estradiol (ESTRACE) 0.1 MG/GM vaginal cream Place 8.41 Applicatorfuls vaginally 2 (two) times a week. Patient not taking: Reported  on 01/18/2020 08/19/19 08/18/20  Harlin Heys, MD  fenofibrate 160 MG tablet Take 160 mg by mouth at bedtime.    [provider]  fluticasone (FLONASE) 50 MCG/ACT nasal spray Place 1 spray into the nose daily as needed for allergies.  Patient not taking: Reported on 01/18/2020    [provider]  furosemide (LASIX) 20 MG tablet Take 20-40 mg by mouth See admin instructions. Take once a day. alternate taking 35m and 423m    [provider]  gabapentin (NEURONTIN) 300 MG capsule Take 300 mg by mouth at bedtime.  09/07/16 10/25/36  [provider]  glimepiride (AMARYL) 2 MG tablet Take 2 mg by mouth at bedtime.    [provider]  Hyoscyamine Sulfate 0.375 MG TBCR Take 0.375 mg by mouth 2 (two) times daily.     [provider]  insulin aspart (NOVOLOG) 100 UNIT/ML FlexPen Inject into the skin.    [provider]  Insulin Degludec (TRESIBA) 100 UNIT/ML SOLN Inject 50 mg into the skin at bedtime.    [provider]  levothyroxine (SYNTHROID, LEVOTHROID) 75 MCG tablet Take 75 mcg by mouth daily before breakfast.     [provider]  magnesium oxide (MAG-OX) 400 MG tablet Take 400 mg by mouth 2 (two) times daily.     [provider]  metFORMIN (GLUCOPHAGE-XR) 750 MG 24 hr tablet Take 750 mg by mouth daily with breakfast.     [provider]  NOVOLOG FLEXPEN 100 UNIT/ML FlexPen Based on sliding scale 08/14/19   [provider]  olopatadine (PATANOL) 0.1 % ophthalmic solution Place 1 drop into both eyes 2 (two) times daily as needed for allergies.  Patient not taking: Reported on 01/18/2020    [provider]  ondansetron (ZOFRAN) 4 MG tablet Take 4 mg by mouth every 8 (eight) hours as needed for nausea or vomiting.  Patient not taking: Reported on 01/18/2020 01/06/17   [provider]  potassium chloride SA (K-DUR,KLOR-CON) 20 MEQ tablet Take 20 mEq by mouth 2 (two) times daily.      [provider]  saccharomyces boulardii (FLORASTOR) 250 MG capsule Take 250 mg by mouth 2 (two) times daily.     [provider]  Semaglutide,0.25 or 0.5MG/DOS, 2 MG/1.5ML SOPN Inject into the skin. 11/22/19   [provider]  simvastatin (ZOCOR) 20 MG tablet Take 20 mg by mouth at bedtime.    [provider]  valACYclovir (VALTREX) 500 MG tablet Take 500 mg by mouth See admin instructions. For fever blisters take 50041mwice a day. Patient not taking: Reported on 01/18/2020 01/06/17   [provider]  valsartan (DIOVAN) 160 MG tablet Take 160  mg by mouth every morning.     [provider]    Inpatient Medications: Scheduled Meds: . clobetasol ointment   Topical Once per day on Mon Thu  . furosemide  20 mg Intravenous BID  . insulin aspart  0-15 Units Subcutaneous Q4H  . metoprolol tartrate  12.5 mg Oral Q6H   Continuous Infusions: . diltiazem (CARDIZEM) infusion Stopped (02/07/20 1541)   PRN Meds:   Allergies:   Allergies  Allergen Reactions  . Codeine Itching and Nausea And Vomiting  . Demeclocycline Rash  . Demerol [Meperidine] Itching and Nausea And Vomiting  . Hydrocodone Itching and Nausea And Vomiting  . Oxycodone Itching and Nausea And Vomiting  . Pentazocine Itching and Nausea And Vomiting  . Tetracyclines & Related Rash  . Fentanyl Itching and Nausea And Vomiting    D/W surgical nurse on 7/23 and pt reportedly tolerated dose during surgery.  Dose was given by MD.  Pt reported that she only gets itching and did not object to receiving med    Social History:   Social History   Socioeconomic History  . Marital status: Married    Spouse name: Not on file  . Number of children: Not on file  . Years of education: Not on file  . Highest education level: Not on file  Occupational History  . Not on file  Tobacco Use  . Smoking status: Never Smoker  . Smokeless tobacco: Never Used  Vaping Use  . Vaping Use: Never  used  Substance and Sexual Activity  . Alcohol use: No    Alcohol/week: 0.0 standard drinks  . Drug use: No  . Sexual activity: Not Currently    Birth control/protection: Surgical  Other Topics Concern  . Not on file  Social History Narrative  . Not on file   Social Determinants of Health   Financial Resource Strain: Not on file  Food Insecurity: Not on file  Transportation Needs: Not on file  Physical Activity: Not on file  Stress: Not on file  Social Connections: Not on file  Intimate Partner Violence: Not on file     Family History:   Family History  Problem Relation Age of Onset  . Breast cancer Paternal Grandmother   . Colon cancer Father   . Diabetes Sister   . Diabetes Brother   . Heart disease Brother   . Prostate cancer Brother   . Colon cancer Maternal Uncle   . Prostate cancer Brother   . Bladder Cancer Brother   . Leukemia Mother        all  . Ovarian cancer Neg Hx   . Kidney cancer Neg Hx     ROS:  Review of Systems  Constitutional: Positive for malaise/fatigue. Negative for chills, diaphoresis, fever and weight loss.  HENT: Negative for congestion.   Eyes: Negative for discharge and redness.  Respiratory: Positive for shortness of breath. Negative for cough, hemoptysis, sputum production and wheezing.   Cardiovascular: Positive for palpitations, orthopnea and leg swelling. Negative for chest pain, claudication and PND.  Gastrointestinal: Positive for blood in stool, constipation and diarrhea. Negative for abdominal pain, heartburn, melena, nausea and vomiting.  Genitourinary: Negative for hematuria.  Musculoskeletal: Negative for falls and myalgias.  Skin: Negative for rash.  Neurological: Positive for weakness. Negative for dizziness, tingling, tremors, sensory change, speech change, focal weakness and loss of consciousness.  Endo/Heme/Allergies: Does not bruise/bleed easily.  Psychiatric/Behavioral: Negative for substance abuse. The patient is not  nervous/anxious.   All  other systems reviewed and are negative.     Physical Exam/Data:   Vitals:   02/07/20 1545 02/07/20 1600 02/07/20 1615 02/07/20 1630  BP: 106/73 99/67 115/69 107/64  Pulse: 63 67 62 64  Resp: (!) 22 (!) 26 (!) 24 (!) 21  Temp:      TempSrc:      SpO2: 97% 97% 97% 96%  Weight:      Height:        Intake/Output Summary (Last 24 hours) at 02/07/2020 1648 Last data filed at 02/07/2020 1541 Gross per 24 hour  Intake 1.78 ml  Output --  Net 1.78 ml   Filed Weights   02/07/20 1431  Weight: 74.8 kg   Body mass index is 29.23 kg/m.   Physical Exam: General: Well developed, well nourished, in no acute distress. Head: Normocephalic, atraumatic, sclera non-icteric, no xanthomas, nares without discharge.  Neck: Negative for carotid bruits. JVD not elevated. Lungs: Diminished breath sounds along the bilateral bases left greater than right. Breathing is unlabored. Heart: Tachycardic, irregularly irregular with S1 S2. No murmurs, rubs, or gallops appreciated. Abdomen: Soft, non-tender, mildly distended with normoactive bowel sounds. No hepatomegaly. No rebound/guarding. No obvious abdominal masses. Msk:  Strength and tone appear normal for age. Extremities: No clubbing or cyanosis.  Trace bilateral pretibial edema. Distal pedal pulses are 2+ and equal bilaterally. Neuro: Alert and oriented X 3. No facial asymmetry. No focal deficit. Moves all extremities spontaneously. Psych:  Responds to questions appropriately with a normal affect.   EKG:  The EKG was personally reviewed and demonstrates: A. fib with RVR, 138 bpm, right axis deviation, diffuse ST-T changes possibly rate related Telemetry:  Telemetry was personally reviewed and demonstrates: Initially A. fib with RVR with ventricular rates in the 130s to 140s bpm, currently improved to the 80s to low 100s bpm  Weights: Filed Weights   02/07/20 1431  Weight: 74.8 kg    Relevant CV Studies:  None  available for review  Laboratory Data:  Chemistry Recent Labs  Lab 02/07/20 1452  NA 135  K 4.0  CL 99  CO2 28  GLUCOSE 230*  BUN 17  CREATININE 0.79  CALCIUM 9.2  GFRNONAA >60  ANIONGAP 8    Recent Labs  Lab 02/07/20 1452  PROT 8.0  ALBUMIN 3.8  AST 30  ALT 20  ALKPHOS 44  BILITOT 1.0   Hematology Recent Labs  Lab 02/07/20 1452  WBC 3.2*  RBC 3.74*  HGB 11.4*  HCT 35.3*  MCV 94.4  MCH 30.5  MCHC 32.3  RDW 14.3  PLT 102*   Cardiac EnzymesNo results for input(s): TROPONINI in the last 168 hours. No results for input(s): TROPIPOC in the last 168 hours.  BNP Recent Labs  Lab 02/07/20 1451  BNP 312.1*    DDimer No results for input(s): DDIMER in the last 168 hours.  Radiology/Studies:  DG Chest 1 View  Result Date: 02/07/2020 IMPRESSION: Mild central pulmonary vascular congestion. Bilateral interstitial densities are noted concerning for edema or possibly atypical inflammation. Electronically Signed   By: Marijo Conception M.D.   On: 02/07/2020 15:28    Assessment and Plan:   1.  New onset A. fib with RVR: -Of uncertain duration though based on symptoms appears to have began around 02/03/2020 -She remains in A. fib with ventricular rates ranging from the 80s to low 100s mostly at this time -Initially placed on diltiazem infusion with noted improvement in ventricular rates though this has subsequently been  discontinued with relative hypotension -CHA2DS2-VASc at least 6 (CHF, HTN, age x2, diabetes, sex category) -Start heparin drip -For now, plan for rate control with metoprolol tartrate 12.5 mg every 6 hours -She remains in A. fib though ventricular rates are well controlled and she is asymptomatic we will plan for DCCV as an outpatient in approximately 3 to 4 weeks after she has been adequately anticoagulated without interruption -Should her ventricular rates proved to be difficult control or if she remains symptomatic she may require TEE guided DCCV  prior to discharge -She will require close monitoring on anticoagulation given her history of colitis with reported history of GI bleeding -She also reports a history of NASH with no known prior EGD to evaluate for varices -Check echo -TSH normal -Potassium and magnesium at goal -Continue to cycle delta troponin  2.  Reported history of cardiac surgery: -She reports having had open cardiac surgery in the 1970s at [facility] though details are not available for review -Upon reviewing care everywhere there is no mention of cardiac surgery under her surgical history at various hospital systems -She reports previously being followed by cardiology in [place] though has not seen a cardiologist since moving back to this area in 2015 -No obvious annuloplasty ring noted on chest x-ray -She has not been maintained on anticoagulation therefore mechanical valve replacement is not likely -Bioprosthetic valve is unlikely as well given time duration -We will have the patient sign a records release so we can further evaluate this -Check echo as above  3.  CHF: -Type uncertain, though she does report a history of congestive heart failure -She did have a stress echo through [facility] in 2010 though records are unavailable for review -Check echo -Start IV Lasix 20 mg daily -Strict I's and O's -Daily weights -Further recommendations pending echo  4.  Pancytopenia/iron deficiency anemia: -Followed by hematology/oncology as an outpatient  5.  OSA: -Recommend continued CPAP usage in the inpatient setting   For questions or updates, please contact [place] Please consult [email or web address] for contact info under Cardiology/STEMI.   Signed, Christell Faith, PA-C [place] Pager: [phone] 02/07/2020, 4:48 PM

## 2020-02-07 NOTE — H&P (Signed)
History and Physical    Annette Foster NFA:[id number] DOB: 1940/05/08 DOA: 02/07/2020  PCP: Albina Billet, MD   Patient coming from: Home  I have personally briefly reviewed patient's old medical records in [place]  Chief Complaint: Palpitations and shortness of breath  HPI: Annette Foster is a 79 y.o. female with medical history significant of reported cardiac surgery in 1970s with no records found, reported CHF with no documentation, has not followed up with cardiology since 2015 when she moved from [place] to [place],left-sided breast cancer status post mastectomy, colitis with intermittent GI bleeding, insulin-dependent diabetes, pancytopenia, HTN, HLD, NASH, iron deficiency anemia requiring iron infusions, seizure disorder, OSA on CPAP, and reflux  came to ED after having palpitations started on Thursday, initially they were intermittent lasting for few minutes, since this morning they are persistent and associated with shortness of breath.  No chest pain but she did endorse some orthopnea for the past 2 days, sleeping on 2 pillows which is new for her.  Denies any PND. No prior history of any abnormal rhythm.  Patient denies any recent upper respiratory illnesses, mild dry cough, no fever or chills.  Worsening abdominal distention and lower extremity edema, patient is compliant with her Lasix. Patient denies any recent changes in her appetite or bowel habits. No urinary symptoms.  No sick contacts.  ED Course: Hemodynamically stable, EKG with A. fib and ventricular rate of  150-170, labs [place] for BNP of 312, TSH normal, COVID-19 negative chest x-ray with mild vascular congestion.  She was started on Cardizem infusion and cardiology was consulted.  Developed a local rash at infusion site resulted in discontinuation of Cardizem infusion.  Currently rate is being managed with p.o. metoprolol.  She was started on heparin infusion by cardiology.  Review of Systems:  As per HPI otherwise 10 point review of systems negative.   Past Medical History:  Diagnosis Date  . Abdominal pain   . Allergy   . Cancer [facility]) 2017   breast cancer- Left  . Cataract   . CHF (congestive heart failure) ([place])   . Collagenous colitis   . Diabetes mellitus without complication ([place])   . Diarrhea   . Diverticulosis   . Fatty liver disease, nonalcoholic   . Fibrocystic breast   . GERD (gastroesophageal reflux disease)   . Heart murmur   . Hyperlipidemia   . Hypertension   . Hypothyroidism   . IBS (irritable bowel syndrome)   . IDA (iron deficiency anemia)   . Personal history of radiation therapy 2017   LEFT BREAST CA  . PONV (postoperative nausea and vomiting)   . Sleep apnea    C-Pap    Past Surgical History:  Procedure Laterality Date  . ABDOMINAL HYSTERECTOMY     tah bso  . ABDOMINAL SURGERY    . APPENDECTOMY    . BREAST BIOPSY Bilateral 2016   negative  . 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 BREAST BIOPSY Left 05/27/2016   BENIGN MAMMARY EPITHELIUM  . BREAST BIOPSY Left 11/20/2017   affirm bx x clip BENIGN MAMMARY EPITHELIUM CONSISTENT WITH RAD THERAPY  . BREAST LUMPECTOMY Left 10/17/2015   DCIS and papillary carcinoma insitu, clear margins  . CARDIAC SURGERY     has replacement valve  . CATARACT EXTRACTION Right   . CHOLECYSTECTOMY    . COLONOSCOPY WITH PROPOFOL N/A 03/11/2016   Procedure: COLONOSCOPY WITH PROPOFOL;  Surgeon: Manya Silvas, MD;  Location: [facility] ENDOSCOPY;  Service: Endoscopy;  Laterality: N/A;  . ESOPHAGOGASTRODUODENOSCOPY (EGD) WITH PROPOFOL N/A 03/11/2016   Procedure: ESOPHAGOGASTRODUODENOSCOPY (EGD) WITH PROPOFOL;  Surgeon: Manya Silvas, MD;  Location: [facility] ENDOSCOPY;  Service: Endoscopy;  Laterality: N/A;  . FINGER ARTHROSCOPY WITH CARPOMETACARPEL ([place]) ARTHROPLASTY Right 09/03/2018   Procedure: CARPOMETACARPEL [facility]) ARTHROPLASTY RIGHT THUMB;  Surgeon: Hessie Knows, MD;  Location: [facility]  ORS;  Service: Orthopedics;  Laterality: Right;  . GANGLION CYST EXCISION Right 09/03/2018   Procedure: REMOVAL GANGLION OF WRIST;  Surgeon: Hessie Knows, MD;  Location: [facility] ORS;  Service: Orthopedics;  Laterality: Right;  . HARDWARE REMOVAL Right 09/03/2018   Procedure: HARDWARE REMOVAL RIGHT THUMB;  Surgeon: Hessie Knows, MD;  Location: [facility] ORS;  Service: Orthopedics;  Laterality: Right;  staple removed  . JOINT REPLACEMENT Left    TKR  . left sinusplasty     . MASTECTOMY, PARTIAL Left 10/17/2015   Procedure: MASTECTOMY PARTIAL REVISION;  Surgeon: Leonie Green, MD;  Location: [facility] ORS;  Service: General;  Laterality: Left;  . PARTIAL MASTECTOMY WITH NEEDLE LOCALIZATION Left 09/29/2015   Procedure: PARTIAL MASTECTOMY WITH NEEDLE LOCALIZATION;  Surgeon: Leonie Green, MD;  Location: [facility] ORS;  Service: General;  Laterality: Left;  . TOTAL ABDOMINAL HYSTERECTOMY W/ BILATERAL SALPINGOOPHORECTOMY       reports that she has never smoked. She has never used smokeless tobacco. She reports that she does not drink alcohol and does not use drugs.  Allergies  Allergen Reactions  . Codeine Itching and Nausea And Vomiting  . Demeclocycline Rash  . Demerol [Meperidine] Itching and Nausea And Vomiting  . Hydrocodone Itching and Nausea And Vomiting  . Oxycodone Itching and Nausea And Vomiting  . Pentazocine Itching and Nausea And Vomiting  . Tetracyclines & Related Rash  . Fentanyl Itching and Nausea And Vomiting    D/W surgical nurse on 7/23 and pt reportedly tolerated dose during surgery.  Dose was given by MD.  Pt reported that she only gets itching and did not object to receiving med    Family History  Problem Relation Age of Onset  . Breast cancer Paternal Grandmother   . Colon cancer Father   . Diabetes Sister   . Diabetes Brother   . Heart disease Brother   . Prostate cancer Brother   . Colon cancer Maternal Uncle   . Prostate cancer Brother   . Bladder Cancer Brother   .  Leukemia Mother        all  . Ovarian cancer Neg Hx   . Kidney cancer Neg Hx     Prior to Admission medications   Medication Sig Start Date End Date Taking? Authorizing Provider  aspirin 81 MG tablet Take 81 mg by mouth at bedtime.    [provider]  benzonatate (TESSALON) 200 MG capsule Take 200 mg by mouth 3 (three) times daily as needed for cough.    [provider]  budesonide-formoterol (SYMBICORT) 80-4.5 MCG/ACT inhaler Inhale 2 puffs into the lungs 2 (two) times daily as needed (shortness of breath).  Patient not taking: Reported on 01/18/2020    [provider]  Cholecalciferol (VITAMIN D) 50 MCG (2000 UT) CAPS Take 2,000 Units by mouth daily.    [provider]  clobetasol ointment (TEMOVATE) 8.67 % Apply 1 application topically 2 (two) times a week. Patient not taking: Reported on 01/18/2020 11/18/19   Harlin Heys, MD  cyanocobalamin (,VITAMIN B-12,) 1000 MCG/ML injection  04/23/19   [provider]  Cyanocobalamin (B-12 COMPLIANCE INJECTION) 1000 MCG/ML KIT Inject 1,000 mcg as directed every 30 (thirty) days.     [provider]  desloratadine (CLARINEX) 5 MG tablet Take 5 mg by mouth daily. Patient not taking: Reported on 01/18/2020 06/18/19   [provider]  dicyclomine (BENTYL) 10 MG capsule Take 10 mg by mouth at bedtime.  05/16/19   [provider]  EPINEPHrine 0.3 mg/0.3 mL IJ SOAJ injection  05/18/19   [provider]  escitalopram (LEXAPRO) 5 MG tablet Take 5 mg by mouth at bedtime.    [provider]  esomeprazole (NEXIUM) 40 MG capsule Take 40 mg by mouth 2 (two) times daily before a meal.  02/08/16   [provider]  estradiol (ESTRACE) 0.1 MG/GM vaginal cream Place 7.61 Applicatorfuls vaginally 2 (two) times a week. Patient not taking: Reported on 01/18/2020 08/19/19 08/18/20  Harlin Heys, MD  fenofibrate 160 MG tablet Take 160 mg by mouth at bedtime.    [provider]  fluticasone (FLONASE) 50 MCG/ACT nasal spray Place 1 spray into the nose daily as needed for allergies.  Patient not taking: Reported on 01/18/2020    [provider]  furosemide (LASIX) 20 MG tablet Take 20-40 mg by mouth See admin instructions. Take once a day. alternate taking 20m and 458m    [provider]  gabapentin (NEURONTIN) 300 MG capsule Take 300 mg by mouth at bedtime.  09/07/16 10/25/36  [provider]  glimepiride (AMARYL) 2 MG tablet Take 2 mg by mouth at bedtime.    [provider]  Hyoscyamine Sulfate 0.375 MG TBCR Take 0.375 mg by mouth 2 (two) times daily.     [provider]  insulin aspart (NOVOLOG) 100 UNIT/ML FlexPen Inject into the skin.    [provider]  Insulin Degludec (TRESIBA) 100 UNIT/ML SOLN Inject 50 mg into the skin at bedtime.    [provider]  levothyroxine (SYNTHROID, LEVOTHROID) 75 MCG tablet Take 75 mcg by mouth daily before breakfast.     [provider]  magnesium oxide (MAG-OX) 400 MG tablet Take 400 mg by mouth 2 (two) times daily.     [provider]  metFORMIN (GLUCOPHAGE-XR) 750 MG 24 hr tablet Take 750 mg by mouth daily with breakfast.     [provider]  NOVOLOG FLEXPEN 100 UNIT/ML FlexPen Based on sliding scale 08/14/19   [provider]  olopatadine (PATANOL) 0.1 % ophthalmic solution Place 1 drop into both eyes 2 (two) times daily as needed for allergies.  Patient not taking: Reported on 01/18/2020    [provider]  ondansetron (ZOFRAN) 4 MG tablet Take 4 mg by mouth every 8 (eight) hours as needed for nausea or vomiting.  Patient not taking: Reported on 01/18/2020 01/06/17   [provider]  potassium chloride SA (K-DUR,KLOR-CON) 20 MEQ tablet Take 20 mEq by mouth 2 (two) times daily.     [provider]  saccharomyces boulardii (FLORASTOR) 250 MG capsule Take 250 mg by mouth 2 (two) times daily.      [provider]  Semaglutide,0.25 or 0.5MG/DOS, 2 MG/1.5ML SOPN Inject into the skin. 11/22/19   [provider]  simvastatin (ZOCOR) 20 MG tablet Take 20 mg by mouth at bedtime.    [provider]  valACYclovir (VALTREX) 500 MG tablet Take 500 mg by mouth See admin instructions. For fever blisters take 500103mwice a day. Patient not taking: Reported on 01/18/2020 01/06/17  [provider]  valsartan (DIOVAN) 160 MG tablet Take 160 mg by mouth every morning.     [provider]    Physical Exam: Vitals:   02/07/20 1545 02/07/20 1600 02/07/20 1615 02/07/20 1630  BP: 106/73 99/67 115/69 107/64  Pulse: 63 67 62 64  Resp: (!) 22 (!) 26 (!) 24 (!) 21  Temp:      TempSrc:      SpO2: 97% 97% 97% 96%  Weight:      Height:        General: Vital signs reviewed.  Patient is well-developed and well-nourished, in no acute distress and cooperative with exam.  Head: Normocephalic and atraumatic. Eyes: EOMI, conjunctivae normal, no scleral icterus.  ENMT: Mucous membranes are moist. Neck: Supple, trachea midline, normal ROM, no JVD, masses, thyromegaly, or carotid bruit present.  Cardiovascular: Irregularly irregular Pulmonary/Chest: Clear to auscultation bilaterally, no wheezes, rales, or rhonchi. Abdominal: Soft, non-tender, distended, BS +,  Extremities: Trace lower extremity edema bilaterally,  pulses symmetric and intact bilaterally. No cyanosis or clubbing. Neurological: A&O x3, Strength is normal and symmetric bilaterally, cranial nerve II-XII are grossly intact, no focal motor deficit, sensory intact to light touch bilaterally.  Psychiatric: Normal mood and affect. speech and behavior is normal.   Labs on Admission: I have personally reviewed following labs and imaging studies  CBC: Recent Labs  Lab 02/07/20 1452  WBC 3.2*  NEUTROABS 1.8  HGB 11.4*  HCT 35.3*  MCV 94.4  PLT 024*   Basic Metabolic Panel: Recent Labs  Lab  02/07/20 1451 02/07/20 1452  NA  --  135  K  --  4.0  CL  --  99  CO2  --  28  GLUCOSE  --  230*  BUN  --  17  CREATININE  --  0.79  CALCIUM  --  9.2  MG 2.3  --    GFR: Estimated Creatinine Clearance: 55.3 mL/min (by C-G formula based on SCr of 0.79 mg/dL). Liver Function Tests: Recent Labs  Lab 02/07/20 1452  AST 30  ALT 20  ALKPHOS 44  BILITOT 1.0  PROT 8.0  ALBUMIN 3.8   No results for input(s): LIPASE, AMYLASE in the last 168 hours. No results for input(s): AMMONIA in the last 168 hours. Coagulation Profile: No results for input(s): INR, PROTIME in the last 168 hours. Cardiac Enzymes: No results for input(s): CKTOTAL, CKMB, CKMBINDEX, TROPONINI in the last 168 hours. BNP (last 3 results) No results for input(s): PROBNP in the last 8760 hours. HbA1C: No results for input(s): HGBA1C in the last 72 hours. CBG: Recent Labs  Lab 02/07/20 1629  GLUCAP 149*   Lipid Profile: No results for input(s): CHOL, HDL, LDLCALC, TRIG, CHOLHDL, LDLDIRECT in the last 72 hours. Thyroid Function Tests: Recent Labs    02/07/20 1451  TSH 3.169   Anemia Panel: No results for input(s): VITAMINB12, FOLATE, FERRITIN, TIBC, IRON, RETICCTPCT in the last 72 hours. Urine analysis:    Component Value Date/Time   COLORURINE YELLOW (A) 06/15/2019 1039   APPEARANCEUR CLEAR (A) 06/15/2019 1039   APPEARANCEUR Cloudy (A) 07/08/2017 1103   LABSPEC 1.014 06/15/2019 1039   PHURINE 6.0 06/15/2019 1039   GLUCOSEU Large (3+) (A) 08/18/2019 1433   GLUCOSEU NEGATIVE 06/15/2019 1039   HGBUR NEGATIVE 06/15/2019 1039   BILIRUBINUR neg 09/30/2019 0911   BILIRUBINUR Negative 07/08/2017 1103   KETONESUR NEGATIVE 06/15/2019 1039   PROTEINUR Negative 09/30/2019 0911   PROTEINUR NEGATIVE 06/15/2019 1039   UROBILINOGEN  0.2 09/30/2019 0911   NITRITE neg 09/30/2019 0911   NITRITE NEGATIVE 06/15/2019 1039   LEUKOCYTESUR Negative 09/30/2019 0911   LEUKOCYTESUR TRACE (A) 06/15/2019 1039     Radiological Exams on Admission: DG Chest 1 View  Result Date: 02/07/2020 CLINICAL DATA:  Chest pain. EXAM: CHEST  1 VIEW COMPARISON:  February 19, 2016. FINDINGS: Stable cardiomediastinal silhouette. No pneumothorax or pleural effusion is noted. Mild central pulmonary vascular congestion is noted. Bilateral interstitial densities are noted concerning for edema or possibly atypical inflammation. Bony thorax is unremarkable. IMPRESSION: Mild central pulmonary vascular congestion. Bilateral interstitial densities are noted concerning for edema or possibly atypical inflammation. Electronically Signed   By: Marijo Conception M.D.   On: 02/07/2020 15:28    EKG: Independently reviewed.  A. Fib. with RVR.  Assessment/Plan Active Problems:   Atrial fibrillation with RVR (HCC)   A. fib with RVR.  Concern of local reaction with Cardizem infusion and it was discontinued.  Rate is currently being controlled with p.o. metoprolol. TSH within normal limit. Cardiology is on board-appreciate their recommendations. CHA2DS2-VASc score of 6. -Continue with heparin infusion. -Continue with metoprolol.  Reported history of CHF.  No prior documentation.  Patient has positive orthopnea and mildly elevated BNP. -Echocardiogram. -Continue with IV Lasix-being managed by cardiology. -Strict intake and output. -Daily weight and BMP.  History of Karlene Lineman.  Patient is with distended abdomen, stating this is getting worse.  Liver enzymes within normal limit. -Ultrasound abdomen.  Pancytopenia.  Seems chronic may be secondary to liver disease. Being managed by outpatient oncology. -Monitor CBC.  Insulin-dependent diabetes mellitus.  CBG elevated.  Patient is on [place] and Metformin along with NovoLog at home. -Continue home dose of Tresiba - 50 units at bedtime -Moderate SSI  Hypertension.  Blood pressure within goal. Holding home valsartan-can be started if needed. Patient is getting metoprolol and blood  pressure seems controlled at this time.  OSA. -Continue CPAP at night   DVT prophylaxis: Heparin Code Status: Full code Family Communication: Daughter was updated at bedside Disposition Plan: Home  Consults called: Cardiology Admission status: Observation   Lorella Nimrod MD [facility]  If 7PM-7AM, please contact night-coverage [email or web address]  02/07/2020, 5:09 PM   This record has been created using Systems analyst. Errors have been sought and corrected,but may not always be located. Such creation errors do not reflect on the standard of care.

## 2020-02-07 NOTE — ED Notes (Signed)
Patient developed red, warm rash on right arm above where Cardizem was running and complained of itching. Per MD, dripped was stopped. Will continue to monitor patient.

## 2020-02-07 NOTE — ED Triage Notes (Signed)
Pt c/o intermittent chest palpitations since Thursday, states today it has been constant with some periods of diaphoresis and SOB.

## 2020-02-07 NOTE — ED Notes (Signed)
MD at bedside. 

## 2020-02-07 NOTE — ED Notes (Signed)
Patient given sandwich tray.

## 2020-02-07 NOTE — ED Notes (Signed)
Received pt at this time. Dressing to Rt hand saturated in blood, removed and new dressing applied. Restricted sleeve applied to Lt arm at this time. Pt provided with clean sheet as hand leaked on bed. Pt provided with BSC and toilet paper to use as needed. Oriented to room and call bell. No c/o at this time.

## 2020-02-08 ENCOUNTER — Observation Stay (HOSPITAL_BASED_OUTPATIENT_CLINIC_OR_DEPARTMENT_OTHER)
Admit: 2020-02-08 | Discharge: 2020-02-08 | Disposition: A | Discharge status: Home / Self Care | Payer: Medicare PPO | Attending: Physician Assistant | Admitting: Physician Assistant

## 2020-02-08 ENCOUNTER — Observation Stay: Payer: Medicare PPO

## 2020-02-08 ENCOUNTER — Telehealth: Payer: Self-pay | Admitting: *Deleted

## 2020-02-08 DIAGNOSIS — I4891 Unspecified atrial fibrillation: Secondary | ICD-10-CM | POA: Diagnosis not present

## 2020-02-08 DIAGNOSIS — I5031 Acute diastolic (congestive) heart failure: Secondary | ICD-10-CM

## 2020-02-08 DIAGNOSIS — K7581 Nonalcoholic steatohepatitis (NASH): Secondary | ICD-10-CM | POA: Diagnosis not present

## 2020-02-08 LAB — BASIC METABOLIC PANEL
Anion gap: 8 (ref 5–15)
BUN: 17 mg/dL (ref 8–23)
CO2: 25 mmol/L (ref 22–32)
Calcium: 8.9 mg/dL (ref 8.9–10.3)
Chloride: 101 mmol/L (ref 98–111)
Creatinine, Ser: 0.86 mg/dL (ref 0.44–1.00)
GFR, Estimated: >60 mL/min (ref >60)
Glucose, Bld: 138 mg/dL — ABNORMAL HIGH (ref 70–99)
Potassium: 3.7 mmol/L (ref 3.5–5.1)
Sodium: 134 mmol/L — ABNORMAL LOW (ref 135–145)

## 2020-02-08 LAB — CBC
HCT: 33.7 % — ABNORMAL LOW (ref 36.0–46.0)
Hemoglobin: 11 g/dL — ABNORMAL LOW (ref 12.0–15.0)
MCH: 30.4 pg (ref 26.0–34.0)
MCHC: 32.6 g/dL (ref 30.0–36.0)
MCV: 93.1 fL (ref 80.0–100.0)
Platelets: 93 10*3/uL — ABNORMAL LOW (ref 150–400)
RBC: 3.62 MIL/uL — ABNORMAL LOW (ref 3.87–5.11)
RDW: 14.2 % (ref 11.5–15.5)
WBC: 3.3 10*3/uL — ABNORMAL LOW (ref 4.0–10.5)
nRBC: 0 % (ref 0.0–0.2)

## 2020-02-08 LAB — ECHOCARDIOGRAM COMPLETE
AR max vel: 1.88 cm2
AV Area VTI: 2.26 cm2
AV Area mean vel: 1.8 cm2
AV Mean grad: 3 mmHg
AV Peak grad: 5 mmHg
Ao pk vel: 1.12 m/s
Area-P 1/2: 9.03 cm2
Height: 63 in
S' Lateral: 2.63 cm
Weight: 2640 oz

## 2020-02-08 LAB — CBG MONITORING, ED
Glucose-Capillary: 118 mg/dL — ABNORMAL HIGH (ref 70–99)
Glucose-Capillary: 205 mg/dL — ABNORMAL HIGH (ref 70–99)

## 2020-02-08 LAB — HEPARIN LEVEL (UNFRACTIONATED): Heparin Unfractionated: <0.1 IU/mL — ABNORMAL LOW (ref 0.30–0.70)

## 2020-02-08 MED ORDER — HEPARIN BOLUS VIA INFUSION
2000.0000 [IU] | Freq: Once | INTRAVENOUS | Status: AC
  Administered 2020-02-08: 2000 [IU] via INTRAVENOUS
  Filled 2020-02-08: qty 2000

## 2020-02-08 MED ORDER — DIGOXIN 125 MCG PO TABS: 0.1250 mg | ORAL_TABLET | Freq: Every day | ORAL | Status: DC

## 2020-02-08 MED ORDER — APIXABAN 5 MG PO TABS: 5.0000 mg | ORAL_TABLET | Freq: Two times a day (BID) | ORAL | 0 refills | Status: DC

## 2020-02-08 MED ORDER — HEPARIN (PORCINE) 25000 UT/250ML-% IV SOLN
1350.0000 [IU]/h | INTRAVENOUS | Status: DC
  Administered 2020-02-08 (×2): 1350 [IU]/h via INTRAVENOUS
  Filled 2020-02-08: qty 250

## 2020-02-08 MED ORDER — METOPROLOL TARTRATE 25 MG PO TABS: 25.0000 mg | ORAL_TABLET | Freq: Two times a day (BID) | ORAL | 0 refills | Status: DC

## 2020-02-08 MED ORDER — APIXABAN 5 MG PO TABS
5.0000 mg | ORAL_TABLET | Freq: Two times a day (BID) | ORAL | Status: DC
  Administered 2020-02-08: 5 mg via ORAL
  Filled 2020-02-08: qty 1

## 2020-02-08 MED ORDER — DIGOXIN 125 MCG PO TABS: 0.1250 mg | ORAL_TABLET | Freq: Every day | ORAL | 0 refills | Status: DC

## 2020-02-08 MED ORDER — METOPROLOL TARTRATE 25 MG PO TABS: 25.0000 mg | ORAL_TABLET | Freq: Two times a day (BID) | ORAL | Status: DC

## 2020-02-08 MED ORDER — FUROSEMIDE 40 MG PO TABS: 20.0000 mg | ORAL_TABLET | Freq: Every day | ORAL | Status: DC

## 2020-02-08 MED ORDER — DIGOXIN 0.25 MG/ML IJ SOLN
0.2500 mg | Freq: Once | INTRAMUSCULAR | Status: AC
  Administered 2020-02-08: 0.25 mg via INTRAVENOUS
  Filled 2020-02-08: qty 2

## 2020-02-08 MED ORDER — POTASSIUM CHLORIDE CRYS ER 20 MEQ PO TBCR
60.0000 meq | EXTENDED_RELEASE_TABLET | Freq: Once | ORAL | Status: AC
  Administered 2020-02-08: 60 meq via ORAL
  Filled 2020-02-08: qty 3

## 2020-02-08 MED ORDER — POTASSIUM CHLORIDE CRYS ER 20 MEQ PO TBCR: 10.0000 meq | EXTENDED_RELEASE_TABLET | Freq: Every day | ORAL | Status: DC

## 2020-02-08 NOTE — Telephone Encounter (Signed)
Spoke with patient and reviewed that provider wants her to have labs done on this Friday and then schedule her appointment for next week. She does not have her purse with her to confirm appointment but advised that I would send her My Chart message with all of this information. She verbalized understanding and was agreeable with this plan. She was appreciative for the call with no further questions at this time.

## 2020-02-08 NOTE — Progress Notes (Addendum)
[place] for Heparin  Indication: atrial fibrillation  Allergies  Allergen Reactions  . Codeine Itching and Nausea And Vomiting  . Demeclocycline Rash  . Demerol [Meperidine] Itching and Nausea And Vomiting  . Hydrocodone Itching and Nausea And Vomiting  . Oxycodone Itching and Nausea And Vomiting  . Pentazocine Itching and Nausea And Vomiting  . Tetracyclines & Related Rash  . Fentanyl Itching and Nausea And Vomiting    D/W surgical nurse on 7/23 and pt reportedly tolerated dose during surgery.  Dose was given by MD.  Pt reported that she only gets itching and did not object to receiving med    Patient Measurements: Height: 5' 3"  (160 cm) Weight: 74.8 kg (165 lb) IBW/kg (Calculated) : 52.4 Heparin Dosing Weight: 68.31 kg   Vital Signs: Temp: 98.4 F (36.9 C) (12/28 0252) Temp Source: Oral (12/28 0252) BP: 102/70 (12/28 0500) Pulse Rate: 85 (12/28 0500)  Labs: Recent Labs    02/07/20 1452 02/07/20 1720 02/08/20 0303 02/08/20 0500  HGB 11.4*  --  11.0*  --   HCT 35.3*  --  33.7*  --   PLT 102*  --  93*  --   APTT  --  30  --   --   LABPROT  --  15.4*  --   --   INR  --  1.3*  --   --   HEPARINUNFRC  --   --   --  <0.10*  CREATININE 0.79  --  0.86  --   TROPONINIHS 14 14  --   --     Estimated Creatinine Clearance: 51.4 mL/min (by C-G formula based on SCr of 0.86 mg/dL).   Medical History: Past Medical History:  Diagnosis Date  . Abdominal pain   . Allergy   . Cancer [facility]) 2017   breast cancer- Left  . Cataract   . CHF (congestive heart failure) ([place])   . Collagenous colitis   . Diabetes mellitus without complication ([place])   . Diarrhea   . Diverticulosis   . Fatty liver disease, nonalcoholic   . Fibrocystic breast   . GERD (gastroesophageal reflux disease)   . Heart murmur   . Hyperlipidemia   . Hypertension   . Hypothyroidism   . IBS (irritable bowel syndrome)   . IDA (iron deficiency anemia)   . Personal  history of radiation therapy 2017   LEFT BREAST CA  . PONV (postoperative nausea and vomiting)   . Sleep apnea    C-Pap    Medications:  (Not in a hospital admission)   Assessment: Pharmacy consulted to dose heparin in this 79 year old female admitted with AFib.  No prior anticoag noted.  CrCl = 55.3 ml/min   12/28 0500 HL <0.10, subtherapeutic  Goal of Therapy:  Heparin level 0.3-0.7 units/ml Monitor platelets by anticoagulation protocol: Yes    Plan: Give 2000 units bolus x 1 Increase heparin infusion to 1350 units/hr Check anti-Xa level in 8 hours and daily while on heparin Continue to monitor H&H and platelets  Renda Rolls, PharmD, MBA 02/08/2020 6:00 AM

## 2020-02-08 NOTE — ED Notes (Signed)
PAtient up to bedside commode

## 2020-02-08 NOTE — Progress Notes (Signed)
Progress Note  Patient Name: Annette Foster Date of Encounter: 02/08/2020  Primary Cardiologist: New to [facility] - consult by End  Subjective   She remains in A. fib with ventricular rates in the 80s to 120s bpm with most rates in the low 100s bpm. She notes improvement in her dyspnea, lower extremity swelling, and orthopnea. No chest pain or palpitations. Echo is pending. No hematochezia or melena on heparin infusion. Chronic pancytopenia persists and is stable.  Inpatient Medications    Scheduled Meds: . clobetasol ointment   Topical Once per day on Mon Thu  . digoxin  0.25 mg Intravenous Once  . escitalopram  5 mg Oral QHS  . furosemide  20 mg Intravenous BID  . gabapentin  300 mg Oral QHS  . insulin aspart  0-15 Units Subcutaneous TID WC  . insulin aspart  0-5 Units Subcutaneous QHS  . insulin glargine  50 Units Subcutaneous QHS  . metoprolol tartrate  12.5 mg Oral Q6H  . pantoprazole  40 mg Oral Daily  . potassium chloride  60 mEq Oral Once  . simvastatin  20 mg Oral QHS   Continuous Infusions: . heparin 1,350 Units/hr (02/08/20 0609)   PRN Meds: acetaminophen **OR** acetaminophen, ondansetron **OR** ondansetron (ZOFRAN) IV, polyethylene glycol   Vital Signs    Vitals:   02/08/20 0252 02/08/20 0300 02/08/20 0500 02/08/20 0800  BP: 92/61 (!) 101/59 102/70 (!) 107/47  Pulse: 84 86 85 78  Resp: 20  (!) 22 19  Temp: 98.4 F (36.9 C)   98.7 F (37.1 C)  TempSrc: Oral   Oral  SpO2: 95% 95% 96% 93%  Weight:      Height:        Intake/Output Summary (Last 24 hours) at 02/08/2020 0858 Last data filed at 02/07/2020 1541 Gross per 24 hour  Intake 1.78 ml  Output --  Net 1.78 ml   Filed Weights   02/07/20 1431  Weight: 74.8 kg    Telemetry    A. fib with ventricular rates in the 80s to 120s bpm with most rates in the low 100s bpm - Personally Reviewed  ECG    No new tracings - Personally Reviewed  Physical Exam   GEN: No acute distress.   Neck:  No JVD. Cardiac: IRIR, no murmurs, rubs, or gallops.  Respiratory: Clear to auscultation bilaterally.  GI: Soft, nontender, mildly distended.   MS: No edema; No deformity. Neuro:  Alert and oriented x 3; Nonfocal.  Psych: Normal affect.  Labs    Chemistry Recent Labs  Lab 02/07/20 1452 02/08/20 0303  NA 135 134*  K 4.0 3.7  CL 99 101  CO2 28 25  GLUCOSE 230* 138*  BUN 17 17  CREATININE 0.79 0.86  CALCIUM 9.2 8.9  PROT 8.0  --   ALBUMIN 3.8  --   AST 30  --   ALT 20  --   ALKPHOS 44  --   BILITOT 1.0  --   GFRNONAA >60 >60  ANIONGAP 8 8     Hematology Recent Labs  Lab 02/07/20 1452 02/08/20 0303  WBC 3.2* 3.3*  RBC 3.74* 3.62*  HGB 11.4* 11.0*  HCT 35.3* 33.7*  MCV 94.4 93.1  MCH 30.5 30.4  MCHC 32.3 32.6  RDW 14.3 14.2  PLT 102* 93*    Cardiac EnzymesNo results for input(s): TROPONINI in the last 168 hours. No results for input(s): TROPIPOC in the last 168 hours.   BNP Recent Labs  Lab 02/07/20 1451  BNP 312.1*     DDimer No results for input(s): DDIMER in the last 168 hours.   Radiology    DG Chest 1 View  Result Date: 02/07/2020 IMPRESSION: Mild central pulmonary vascular congestion. Bilateral interstitial densities are noted concerning for edema or possibly atypical inflammation. Electronically Signed   By: Marijo Conception M.D.   On: 02/07/2020 15:28   US Abdomen Limited RUQ (LIVER/GB)  Result Date: 02/08/2020 IMPRESSION: 1. Cholecystectomy. No biliary distention. 2. Mild increased hepatic echogenicity. Nodular hepatic border suggesting cirrhosis. No focal hepatic abnormality identified. Portal vein patent with normal direction of flow. 3. Small amount of ascites. Right pleural effusion noted. Electronically Signed   By: Marcello Moores  Register   On: 02/08/2020 07:15    Cardiac Studies   2D echo 02/08/2020: Pending  Patient Profile     79 y.o. female with history of reported cardiac surgery in the 1540G of uncertain etiology at [facility], reported  CHF, left-sided breast cancer status post mastectomy, colitis with intermittent GI bleeding, insulin-dependent diabetes, pancytopenia, HTN, HLD, NASH, iron deficiency anemia requiring iron infusions, seizure disorder, OSA on CPAP, and reflux who is being seen today for the evaluation of new onset atrial flutter with RVR.  Assessment & Plan    1. New onset A. fib with RVR: -Of uncertain duration though based on symptoms appears to have began around 02/03/2020 -She remains in A. fib with ventricular rates ranging from the 80s to the 120s bpm with most rates in the low 100s bpm -Relative hypotension precludes escalation of metoprolol or addition of diltiazem -Give a one-time dose of IV digoxin 0.25 along with KCl 60 mEq -CHA2DS2-VASc at least 6 (CHF, HTN, age x2, diabetes, sex category) -Continue heparin drip for now -Recommend GI consult given patient's history of portal gastropathy and colitis to weigh in on the patient's long-term anticoagulation status -For now, plan for rate control with metoprolol tartrate 12.5 mg every 6 hours -Ambulate later this morning to assess for ventricular rate control and symptoms, if these are well controlled, and once she has been evaluated by GI she may be able to be discharged later today with outpatient follow-up for DCCV in approximately 3 to 4 weeks after she has been adequately anticoagulated without interruption. However, should her ventricular rates proved to be difficult control or if she is symptomatic with ambulation she may require TEE guided DCCV prior to discharge -She will require close monitoring on anticoagulation given her history of portal gastropathy and colitis with reported history of GI bleeding -Echo pending -TSH normal -Replete potassium to goal 4.0  -High-sensitivity troponin negative x2  2.  Reported history of cardiac surgery: -She reports having had open cardiac surgery in the 1970s at [facility] though details are not available for  review -Upon reviewing care everywhere there is no mention of cardiac surgery under her surgical history at various hospital systems -She reports previously being followed by cardiology in [place] though has not seen a cardiologist since moving back to this area in 2015 -No obvious annuloplasty ring noted on chest x-ray -She has not been maintained on anticoagulation therefore mechanical valve replacement is not likely -Tissue valve is unlikely as well given time duration -Record request has been initiated  -Check echo as above  3.  CHF: -Type uncertain, though she does report a history of congestive heart failure -Symptoms significantly improved and she reports being back to baseline -Echo pending -Continue IV Lasix 20 mg for this morning  -Strict  I's and O's -Daily weights -Further recommendations pending echo  4.  Pancytopenia/iron deficiency anemia: -Stable -Followed by hematology/oncology as an outpatient  5.  OSA: -Recommend continued CPAP usage in the inpatient setting   For questions or updates, please contact [place] Please consult [email or web address] for contact info under Cardiology/STEMI.    Signed, Christell Faith, PA-C [place] Pager: [phone] 02/08/2020, 8:58 AM

## 2020-02-08 NOTE — ED Notes (Signed)
Pt given toothbrush and warm cloths to wipe up with.

## 2020-02-08 NOTE — Progress Notes (Deleted)
Cardiology Office Note    Date:  02/08/2020   ID:  Annette, Foster 15-Jul-1940, MRN [id number]  PCP:  Albina Billet, MD  Cardiologist:  Nelva Bush, MD  Electrophysiologist:  None   Chief Complaint: Hospital follow up  History of Present Illness:   Annette Foster is a 79 y.o. female with history of ***  ***   Labs independently reviewed: ***  Past Medical History:  Diagnosis Date  . Abdominal pain   . Allergy   . Cancer [facility]) 2017   breast cancer- Left  . Cataract   . CHF (congestive heart failure) ([place])   . Collagenous colitis   . Diabetes mellitus without complication ([place])   . Diarrhea   . Diverticulosis   . Fatty liver disease, nonalcoholic   . Fibrocystic breast   . GERD (gastroesophageal reflux disease)   . Heart murmur   . Hyperlipidemia   . Hypertension   . Hypothyroidism   . IBS (irritable bowel syndrome)   . IDA (iron deficiency anemia)   . Personal history of radiation therapy 2017   LEFT BREAST CA  . PONV (postoperative nausea and vomiting)   . Sleep apnea    C-Pap    Past Surgical History:  Procedure Laterality Date  . ABDOMINAL HYSTERECTOMY     tah bso  . ABDOMINAL SURGERY    . APPENDECTOMY    . BREAST BIOPSY Bilateral 2016   negative  . 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 BREAST BIOPSY Left 05/27/2016   BENIGN MAMMARY EPITHELIUM  . BREAST BIOPSY Left 11/20/2017   affirm bx x clip BENIGN MAMMARY EPITHELIUM CONSISTENT WITH RAD THERAPY  . BREAST LUMPECTOMY Left 10/17/2015   DCIS and papillary carcinoma insitu, clear margins  . CARDIAC SURGERY     has replacement valve  . CATARACT EXTRACTION Right   . CHOLECYSTECTOMY    . COLONOSCOPY WITH PROPOFOL N/A 03/11/2016   Procedure: COLONOSCOPY WITH PROPOFOL;  Surgeon: Manya Silvas, MD;  Location: [facility] ENDOSCOPY;  Service: Endoscopy;  Laterality: N/A;  . ESOPHAGOGASTRODUODENOSCOPY (EGD) WITH PROPOFOL N/A 03/11/2016   Procedure:  ESOPHAGOGASTRODUODENOSCOPY (EGD) WITH PROPOFOL;  Surgeon: Manya Silvas, MD;  Location[facility] ENDOSCOPY;  Service: Endoscopy;  Laterality: N/A;  . FINGER ARTHROSCOPY WITH CARPOMETACARPEL ([place]) ARTHROPLASTY Right 09/03/2018   Procedure: CARPOMETACARPEL [facility]) ARTHROPLASTY RIGHT THUMB;  Surgeon: Hessie Knows, MD;  Location: [facility] ORS;  Service: Orthopedics;  Laterality: Right;  . GANGLION CYST EXCISION Right 09/03/2018   Procedure: REMOVAL GANGLION OF WRIST;  Surgeon: Hessie Knows, MD;  Location: [facility] ORS;  Service: Orthopedics;  Laterality: Right;  . HARDWARE REMOVAL Right 09/03/2018   Procedure: HARDWARE REMOVAL RIGHT THUMB;  Surgeon: Hessie Knows, MD;  Location: [facility] ORS;  Service: Orthopedics;  Laterality: Right;  staple removed  . JOINT REPLACEMENT Left    TKR  . left sinusplasty     . MASTECTOMY, PARTIAL Left 10/17/2015   Procedure: MASTECTOMY PARTIAL REVISION;  Surgeon: Leonie Green, MD;  Location: [facility] ORS;  Service: General;  Laterality: Left;  . PARTIAL MASTECTOMY WITH NEEDLE LOCALIZATION Left 09/29/2015   Procedure: PARTIAL MASTECTOMY WITH NEEDLE LOCALIZATION;  Surgeon: Leonie Green, MD;  Location: [facility] ORS;  Service: General;  Laterality: Left;  . TOTAL ABDOMINAL HYSTERECTOMY W/ BILATERAL SALPINGOOPHORECTOMY      Current Medications: No outpatient medications have been marked as taking for the 02/18/20 encounter (Appointment) with Rise Mu, PA-C.    Allergies:  Codeine, Demeclocycline, Demerol [meperidine], Hydrocodone, Oxycodone, Pentazocine, Tetracyclines & related, and Fentanyl   Social History   Socioeconomic History  . Marital status: Married    Spouse name: Not on file  . Number of children: Not on file  . Years of education: Not on file  . Highest education level: Not on file  Occupational History  . Not on file  Tobacco Use  . Smoking status: Never Smoker  . Smokeless tobacco: Never Used  Vaping Use  . Vaping Use: Never used  Substance and Sexual  Activity  . Alcohol use: No    Alcohol/week: 0.0 standard drinks  . Drug use: No  . Sexual activity: Not Currently    Birth control/protection: Surgical  Other Topics Concern  . Not on file  Social History Narrative  . Not on file   Social Determinants of Health   Financial Resource Strain: Not on file  Food Insecurity: Not on file  Transportation Needs: Not on file  Physical Activity: Not on file  Stress: Not on file  Social Connections: Not on file     Family History:  The patient's family history includes Bladder Cancer in her brother; Breast cancer in her paternal grandmother; Colon cancer in her father and maternal uncle; Diabetes in her brother and sister; Heart disease in her brother; Leukemia in her mother; Prostate cancer in her brother and brother. There is no history of Ovarian cancer or Kidney cancer.  ROS:   ROS   EKGs/Labs/Other Studies Reviewed:    Studies reviewed were summarized above. The additional studies were reviewed today:  2D echo 02/08/2020: 1. Left ventricular ejection fraction, by estimation, is 50 to 55%. The  left ventricle has low normal function. Left ventricular endocardial  border not optimally defined to evaluate regional wall motion. There is  mild left ventricular hypertrophy. Left  ventricular diastolic parameters are indeterminate.  2. Right ventricular systolic function is mildly reduced. The right  ventricular size is normal. Mildly increased right ventricular wall  thickness. There is moderately elevated pulmonary artery systolic  pressure.  3. Left atrial size was moderately dilated.  4. Right atrial size was mildly dilated.  5. The pericardial effusion is posterior to the left ventricle.  6. The mitral valve is abnormal. Trivial mitral valve regurgitation.  7. Tricuspid valve regurgitation is mild to moderate.  8. The aortic valve is tricuspid. There is mild calcification of the  aortic valve. There is mild thickening  of the aortic valve. Aortic valve  regurgitation is not visualized. Mild to moderate aortic valve  sclerosis/calcification is present, without any  evidence of aortic stenosis.  9. There is mild dilatation of the ascending aorta, measuring 36 mm.  10. The inferior vena cava is dilated in size with <50% respiratory  variability, suggesting right atrial pressure of 15 mmHg.   EKG:  EKG is ordered today.  The EKG ordered today demonstrates ***  Recent Labs: 02/07/2020: ALT 20; B Natriuretic Peptide 312.1; Magnesium 2.3; TSH 3.169 02/08/2020: BUN 17; Creatinine, Ser 0.86; Hemoglobin 11.0; Platelets 93; Potassium 3.7; Sodium 134  Recent Lipid Panel No results found for: CHOL, TRIG, HDL, CHOLHDL, VLDL, LDLCALC, LDLDIRECT  PHYSICAL EXAM:    VS:  LMP  (LMP Unknown)   BMI: There is no height or weight on file to calculate BMI.  Physical Exam  Wt Readings from Last 3 Encounters:  02/07/20 165 lb (74.8 kg)  01/18/20 168 lb 9.6 oz (76.5 kg)  12/31/19 166 lb (75.3 kg)  ASSESSMENT & PLAN:   1. ***  Disposition: F/u with Dr. Saunders Revel or an APP in ***.   Medication Adjustments/Labs and Tests Ordered: Current medicines are reviewed at length with the patient today.  Concerns regarding medicines are outlined above. Medication changes, Labs and Tests ordered today are summarized above and listed in the Patient Instructions accessible in Encounters.   Signed, Christell Faith, PA-C 02/08/2020 2:53 PM     [place] [address] [place] [address] [place], [place] [address] [phone]

## 2020-02-08 NOTE — Progress Notes (Signed)
Inpatient Diabetes Program Recommendations  AACE/ADA: New Consensus Statement on Inpatient Glycemic Control   Target Ranges:  Prepandial:   less than 140 mg/dL      Peak postprandial:   less than 180 mg/dL (1-2 hours)      Critically ill patients:  140 - 180 mg/dL   Results for TANA, TREFRY (MRN [id number]) as of 02/08/2020 09:47  Ref. Range 02/07/2020 16:29 02/07/2020 19:51 02/07/2020 22:14 02/08/2020 08:18  Glucose-Capillary Latest Ref Range: 70 - 99 mg/dL 149 (H) 222 (H) 225 (H) 118 (H)  Results for RYANE, KONIECZNY (MRN [id number]) as of 02/08/2020 09:47  Ref. Range 02/07/2020 17:20  Hemoglobin A1C Latest Ref Range: 4.8 - 5.6 % 9.1 (H)   Review of Glycemic Control  Diabetes history: DM2 Outpatient Diabetes medications: Tresiba 50 units QHS, Novolog 15 units TID with meals, Metformin XR 750 mg daily, Amaryl 2 mg daily, Ozempic 0.5 mg Qweek (Sunday) Current orders for Inpatient glycemic control: Lantus 50 units QHS, Novolog 0-15 units TID with meals, Novolog 0-5 units QHS  Inpatient Diabetes Program Recommendations:    HbgA1C: A1C 9.1% on 02/07/20 indicating an average glucose of 214 mg/dl over the past 2-3 months. Prior A1C was 10.8% on 06/16/2019/   NOTE: In reviewing chart, noted patient is followed by [facility] and last seen Renae Gloss, NP on 01/03/20 and per office note patient's DM medication were adjusted as follows:  - Continue Metformin 750 mg once daily - Continue Glimepiride 2 mg daily - Continue Novolog 15 units 3 times daily with meals - Increase Tresiba from 46 units to 50 units each evening - Continue Ozempic 0.5 mg weekly  Patient received Lantus 50 units last night and fasting glucose 118 mg/dl today.  No recommendations for insulin changes at this time. Will follow glucose trends and make recommendations if needed.  Thanks, Barnie Alderman, RN, MSN, CDE Diabetes Coordinator Inpatient Diabetes Program [phone] (Team Pager from 8am to  5pm)

## 2020-02-08 NOTE — Progress Notes (Signed)
*  PRELIMINARY RESULTS* Echocardiogram 2D Echocardiogram has been performed.  Annette Foster 02/08/2020, 9:09 AM

## 2020-02-08 NOTE — ED Notes (Signed)
US at bedside

## 2020-02-08 NOTE — ED Notes (Signed)
Patient given warm blanket.

## 2020-02-08 NOTE — Telephone Encounter (Signed)
-----   Message from Sondra Barges, [place] sent at 02/08/2020 12:35 PM EST ----- Elita Quick, please get her set up for a BMET and digoxin level to be drawn on 12/31 and for hospital follow up to be seen by me next week. Thanks! Ryan

## 2020-02-08 NOTE — ED Notes (Signed)
MD End messaged and coming to speak with patient before discharge.

## 2020-02-08 NOTE — Discharge Summary (Signed)
Physician Discharge Summary  Krisi Azua ZMO:[id number] DOB: [birth year] DOA: 02/07/2020  PCP: Albina Billet, MD  Admit date: 02/07/2020 Discharge date: 02/08/2020  Admitted From: Home Disposition: Home   Recommendations for Outpatient Follow-up:  1. Follow up with PCP in 1-2 weeks 2. Please obtain BMP/CBC in one week 3. Please follow up on the following pending results:None  Home Health: No Equipment/Devices: None Discharge Condition: Stable CODE STATUS: Full Diet recommendation: Heart Healthy / Carb Modified   Brief/Interim Summary:  Annette Foster is a 79 y.o. female with medical history significant of reported cardiac surgery in 1970s with no records found, reported CHF with no documentation, has not followed up with cardiology since 2015 when she moved from [place] to [place],left-sided breast cancer status post mastectomy, colitis with intermittent GI bleeding, insulin-dependent diabetes, pancytopenia, HTN, HLD, NASH, iron deficiency anemia requiring iron infusions, seizure disorder, OSA on CPAP, and reflux came to ED after having palpitations started on Thursday, initially they were intermittent lasting for few minutes, since this morning they are persistent and associated with shortness of breath.  No chest pain but she did endorse some orthopnea for the past 2 days, sleeping on 2 pillows which is new for her.  Denies any PND. No prior history of any abnormal rhythm.  Patient denies any recent upper respiratory illnesses, mild dry cough, no fever or chills.  Worsening abdominal distention and lower extremity edema, patient is compliant with her Lasix.  She was admitted for new onset A. fib with RVR.  Initially started on Cardizem infusion and cardiology was also consulted.  Cardizem infusion was discontinued due to local erythematous reaction at IV site.  Her heart rate controlled with p.o. metoprolol.  She was also started on digoxin and Eliquis due to  CHA2DS2-VASc score of 6, patient has an history of GI bleed and after discussing risk and benefit, we will proceed with Eliquis, home dose of aspirin was discontinued and plan is to do a DCCV in few weeks as an outpatient.  She will follow-up closely with cardiology.  Patient has an history of [place], abdomen was distended.  Right upper quadrant ultrasound with liver pathology consistent with cirrhosis and mild ascites. She will continue her follow-up with GI.  Patient has an history of pancytopenia and being managed by oncology as an outpatient.  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  No active bleeding.  She will continue with rest of her home meds and follow-up with her providers.  Discharge Diagnoses:  Active Problems:   Atrial fibrillation with RVR [facility])  Discharge Instructions  Discharge Instructions    Diet - low sodium heart healthy   Complete by: As directed    Discharge instructions   Complete by: As directed    It was pleasure taking care of you. Cardiology started you on a blood thinner for your abnormal heart rhythm, they also started you on digoxin and metoprolol for better heart rate control.  I am discontinuing your aspirin to decrease the chances of abnormality bleeding with Eliquis.  If you experience any abnormal bleeding please seek medical attention. Please follow-up with cardiology according to your appointment for further management of your abnormal rhythm.   Increase activity slowly   Complete by: As directed      Allergies as of 02/08/2020      Reactions   Codeine Itching, Nausea And Vomiting   Demeclocycline Rash   Demerol [meperidine] Itching, Nausea And Vomiting   Hydrocodone Itching, Nausea And Vomiting   Oxycodone  Itching, Nausea And Vomiting   Pentazocine Itching, Nausea And Vomiting   Tetracyclines & Related Rash   Fentanyl Itching, Nausea And Vomiting   D/W surgical nurse on 7/23 and pt reportedly tolerated dose during surgery.  Dose was  given by MD.  Pt reported that she only gets itching and did not object to receiving med      Medication List    STOP taking these medications   aspirin 81 MG tablet   clindamycin 150 MG capsule Commonly known as: CLEOCIN     TAKE these medications   apixaban 5 MG Tabs tablet Commonly known as: ELIQUIS Take 1 tablet (5 mg total) by mouth 2 (two) times daily.   benzonatate 200 MG capsule Commonly known as: TESSALON Take 200 mg by mouth 3 (three) times daily as needed for cough.   clobetasol ointment 0.05 % Commonly known as: TEMOVATE Apply 1 application topically 2 (two) times a week.   cyanocobalamin 1000 MCG/ML injection Commonly known as: (VITAMIN B-12)   dicyclomine 10 MG capsule Commonly known as: BENTYL Take 10 mg by mouth at bedtime.   digoxin 0.125 MG tablet Commonly known as: LANOXIN Take 1 tablet (0.125 mg total) by mouth daily. Start taking on: February 09, 2020   EPINEPHrine 0.3 mg/0.3 mL Soaj injection Commonly known as: EPI-PEN   escitalopram 5 MG tablet Commonly known as: LEXAPRO Take 5 mg by mouth at bedtime.   esomeprazole 40 MG capsule Commonly known as: NEXIUM Take 40 mg by mouth 2 (two) times daily before a meal.   estradiol 0.1 MG/GM vaginal cream Commonly known as: ESTRACE Place 2.95 Applicatorfuls vaginally 2 (two) times a week.   fenofibrate 160 MG tablet Take 160 mg by mouth at bedtime.   fluticasone 50 MCG/ACT nasal spray Commonly known as: FLONASE Place 1 spray into the nose daily as needed for allergies.   furosemide 20 MG tablet Commonly known as: LASIX Take 20-40 mg by mouth See admin instructions. Take once a day. alternate taking 37m and 419m   gabapentin 300 MG capsule Commonly known as: NEURONTIN Take 300 mg by mouth at bedtime.   glimepiride 2 MG tablet Commonly known as: AMARYL Take 2 mg by mouth at bedtime.   Hyoscyamine Sulfate 0.375 MG Tbcr Take 0.375 mg by mouth 2 (two) times daily.   levothyroxine 75  MCG tablet Commonly known as: SYNTHROID Take 75 mcg by mouth daily before breakfast.   magnesium oxide 400 MG tablet Commonly known as: MAG-OX Take 400 mg by mouth 2 (two) times daily.   metFORMIN 750 MG 24 hr tablet Commonly known as: GLUCOPHAGE-XR Take 750 mg by mouth daily with breakfast.   metoprolol tartrate 25 MG tablet Commonly known as: LOPRESSOR Take 1 tablet (25 mg total) by mouth 2 (two) times daily.   insulin aspart 100 UNIT/ML FlexPen Commonly known as: NOVOLOG Inject 15 Units into the skin 3 (three) times daily with meals.   NovoLOG FlexPen 100 UNIT/ML FlexPen Generic drug: insulin aspart Based on sliding scale   ondansetron 4 MG tablet Commonly known as: ZOFRAN Take 4 mg by mouth every 8 (eight) hours as needed for nausea or vomiting.   potassium chloride SA 20 MEQ tablet Commonly known as: KLOR-CON Take 20 mEq by mouth 2 (two) times daily.   saccharomyces boulardii 250 MG capsule Commonly known as: FLORASTOR Take 250 mg by mouth 2 (two) times daily.   Semaglutide(0.25 or 0.5MG/DOS) 2 MG/1.5ML Sopn Inject 0.5 mg into the skin once a  week. On Sunday.   simvastatin 20 MG tablet Commonly known as: ZOCOR Take 20 mg by mouth at bedtime.   Tresiba 100 UNIT/ML Soln Generic drug: Insulin Degludec Inject 50 mg into the skin at bedtime.   valACYclovir 500 MG tablet Commonly known as: VALTREX Take 500 mg by mouth See admin instructions. For fever blisters take 59m twice a day.   valsartan 160 MG tablet Commonly known as: DIOVAN Take 160 mg by mouth every morning.   Vitamin D 50 MCG (2000 UT) Caps Take 2,000 Units by mouth daily.       Follow-up Information    End, CHarrell Gave MD. Schedule an appointment as soon as possible for a visit.   Specialty: Cardiology Contact information: [address] [phone]       TAlbina Billet MD. Schedule an appointment as soon as possible for a visit.   Specialty:  Internal Medicine Contact information: [address] [phone]             Allergies  Allergen Reactions  . Codeine Itching and Nausea And Vomiting  . Demeclocycline Rash  . Demerol [Meperidine] Itching and Nausea And Vomiting  . Hydrocodone Itching and Nausea And Vomiting  . Oxycodone Itching and Nausea And Vomiting  . Pentazocine Itching and Nausea And Vomiting  . Tetracyclines & Related Rash  . Fentanyl Itching and Nausea And Vomiting    D/W surgical nurse on 7/23 and pt reportedly tolerated dose during surgery.  Dose was given by MD.  Pt reported that she only gets itching and did not object to receiving med    Consultations:  Cardiology  Procedures/Studies: DG Chest 1 View  Result Date: 02/07/2020 CLINICAL DATA:  Chest pain. EXAM: CHEST  1 VIEW COMPARISON:  February 19, 2016. FINDINGS: Stable cardiomediastinal silhouette. No pneumothorax or pleural effusion is noted. Mild central pulmonary vascular congestion is noted. Bilateral interstitial densities are noted concerning for edema or possibly atypical inflammation. Bony thorax is unremarkable. IMPRESSION: Mild central pulmonary vascular congestion. Bilateral interstitial densities are noted concerning for edema or possibly atypical inflammation. Electronically Signed   By: JMarijo ConceptionM.D.   On: 02/07/2020 15:28   DG Ribs Unilateral Right  Result Date: 01/19/2020 CLINICAL DATA:  Right posterior chest wall pain for weeks. No known injury. EXAM: RIGHT RIBS - 2 VIEW COMPARISON:  None. FINDINGS: No fracture or other bone lesions are seen involving the ribs. Ribs 10 through 12 are not well visualized on the oblique view due to overlapping soft tissue structures. No focal abnormality in the included right lung. IMPRESSION: Unremarkable radiographic appearance of the right ribs, no explanation for pain. Electronically Signed   By: MKeith RakeM.D.   On: 01/19/2020 16:16   DG Thoracic  Spine 2 View  Result Date: 01/19/2020 CLINICAL DATA:  Mid back pain EXAM: THORACIC SPINE 2 VIEWS COMPARISON:  11/14/2017 FINDINGS: There is no evidence of thoracic spine fracture. Alignment is normal. Minimal disc height loss with associated anterior endplate spurring within the midthoracic spine. No other significant bone abnormalities are identified. IMPRESSION: Minimal degenerative changes in the midthoracic spine. No acute findings. Electronically Signed   By: NDavina PokeD.O.   On: 01/19/2020 16:17   MM DIAG BREAST TOMO BILATERAL  Result Date: 01/31/2020 CLINICAL DATA:  History of a left lumpectomy for breast carcinoma performed in 2017. Patient has had 2 subsequent benign biopsies in the lumpectomy  bed. Patient was treated with adjuvant radiation therapy. EXAM: DIGITAL DIAGNOSTIC BILATERAL MAMMOGRAM WITH TOMO AND CAD COMPARISON:  Previous exam(s). ACR Breast Density Category c: The breast tissue is heterogeneously dense, which may obscure small masses. FINDINGS: Post lumpectomy changes on the left are stable from the most recent prior exam. There are no masses or areas of nonsurgical architectural distortion. There are no suspicious calcifications. Mammographic images were processed with CAD. IMPRESSION: 1. No evidence of new or recurrent breast carcinoma. 2. Benign post lumpectomy changes on the left. RECOMMENDATION: Diagnostic mammogram in 1 year per standard post lumpectomy protocol. (Code:[id number]-B-01Y) I have discussed the findings and recommendations with the patient. If applicable, a reminder letter will be sent to the patient regarding the next appointment. BI-RADS CATEGORY  2: Benign. Electronically Signed   By: Lajean Manes M.D.   On: 01/31/2020 10:14   ECHOCARDIOGRAM COMPLETE  Result Date: 02/08/2020    ECHOCARDIOGRAM REPORT   Patient Name:   Annette Foster Date of Exam: 02/08/2020 Medical Rec #:  [id number]           Height:       63.0 in Accession #:    [id number]          Weight:        165.0 lb Date of Birth:  01/25/1941           BSA:          1.782 m Patient Age:    79 years            BP:           107/47 mmHg Patient Gender: F                   HR:           78 bpm. Exam Location:  [facility] Procedure: 2D Echo, Cardiac Doppler and Color Doppler Indications:     Atrial Fibrillation 148.91  History:         Patient has no prior history of Echocardiogram examinations.                  CHF; Risk Factors:Hypertension and Diabetes.  Sonographer:     Sherrie Sport RDCS (AE) Referring Phys:  [id number] Rise Mu Diagnosing Phys: Nelva Bush MD  Sonographer Comments: Technically challenging study due to limited acoustic windows and suboptimal apical window. IMPRESSIONS  1. Left ventricular ejection fraction, by estimation, is 50 to 55%. The left ventricle has low normal function. Left ventricular endocardial border not optimally defined to evaluate regional wall motion. There is mild left ventricular hypertrophy. Left ventricular diastolic parameters are indeterminate.  2. Right ventricular systolic function is mildly reduced. The right ventricular size is normal. Mildly increased right ventricular wall thickness. There is moderately elevated pulmonary artery systolic pressure.  3. Left atrial size was moderately dilated.  4. Right atrial size was mildly dilated.  5. The pericardial effusion is posterior to the left ventricle.  6. The mitral valve is abnormal. Trivial mitral valve regurgitation.  7. Tricuspid valve regurgitation is mild to moderate.  8. The aortic valve is tricuspid. There is mild calcification of the aortic valve. There is mild thickening of the aortic valve. Aortic valve regurgitation is not visualized. Mild to moderate aortic valve sclerosis/calcification is present, without any evidence of aortic stenosis.  9. There is mild dilatation of the ascending aorta, measuring 36 mm. 10. The inferior vena cava is dilated in size with <50% respiratory variability,  suggesting right atrial  pressure of 15 mmHg. FINDINGS  Left Ventricle: Left ventricular ejection fraction, by estimation, is 50 to 55%. The left ventricle has low normal function. Left ventricular endocardial border not optimally defined to evaluate regional wall motion. The left ventricular internal cavity  size was normal in size. There is mild left ventricular hypertrophy. Left ventricular diastolic parameters are indeterminate. Right Ventricle: The right ventricular size is normal. Mildly increased right ventricular wall thickness. Right ventricular systolic function is mildly reduced. There is moderately elevated pulmonary artery systolic pressure. The tricuspid regurgitant velocity is 2.95 m/s, and with an assumed right atrial pressure of 15 mmHg, the estimated right ventricular systolic pressure is 40.9 mmHg. Left Atrium: Left atrial size was moderately dilated. Right Atrium: Right atrial size was mildly dilated. Pericardium: Trivial pericardial effusion is present. The pericardial effusion is posterior to the left ventricle. Presence of pericardial fat pad. Mitral Valve: The mitral valve is abnormal. There is mild calcification of the mitral valve leaflet(s). Trivial mitral valve regurgitation. Tricuspid Valve: The tricuspid valve is not well visualized. Tricuspid valve regurgitation is mild to moderate. Aortic Valve: The aortic valve is tricuspid. There is mild calcification of the aortic valve. There is mild thickening of the aortic valve. There is mild aortic valve annular calcification. Aortic valve regurgitation is not visualized. Mild to moderate aortic valve sclerosis/calcification is present, without any evidence of aortic stenosis. Aortic valve mean gradient measures 3.0 mmHg. Aortic valve peak gradient measures 5.0 mmHg. Aortic valve area, by VTI measures 2.26 cm. Pulmonic Valve: The pulmonic valve was grossly normal. Pulmonic valve regurgitation is mild. No evidence of pulmonic stenosis. Aorta: The aortic root is normal  in size and structure. There is mild dilatation of the ascending aorta, measuring 36 mm. Pulmonary Artery: The pulmonary artery is not well seen. Venous: The inferior vena cava is dilated in size with less than 50% respiratory variability, suggesting right atrial pressure of 15 mmHg. IAS/Shunts: The interatrial septum was not well visualized.  LEFT VENTRICLE PLAX 2D LVIDd:         3.82 cm  Diastology LVIDs:         2.63 cm  LV e' medial:    5.44 cm/s LV PW:         1.27 cm  LV E/e' medial:  20.4 LV IVS:        1.01 cm  LV e' lateral:   6.09 cm/s LVOT diam:     2.00 cm  LV E/e' lateral: 18.2 LV SV:         40 LV SV Index:   22 LVOT Area:     3.14 cm  RIGHT VENTRICLE RV Basal diam:  3.92 cm RV S prime:     8.27 cm/s TAPSE (M-mode): 1.8 cm LEFT ATRIUM            Index       RIGHT ATRIUM           Index LA diam:      4.20 cm  2.36 cm/m  RA Area:     20.50 cm LA Vol (A2C): 17.6 ml  9.88 ml/m  RA Volume:   52.50 ml  29.46 ml/m LA Vol (A4C): 105.0 ml 58.93 ml/m  AORTIC VALVE                   PULMONIC VALVE AV Area (Vmax):    1.88 cm    PV Vmax:        0.72  m/s AV Area (Vmean):   1.80 cm    PV Peak grad:   2.1 mmHg AV Area (VTI):     2.26 cm    RVOT Peak grad: 2 mmHg AV Vmax:           111.50 cm/s AV Vmean:          79.600 cm/s AV VTI:            0.176 m AV Peak Grad:      5.0 mmHg AV Mean Grad:      3.0 mmHg LVOT Vmax:         66.60 cm/s LVOT Vmean:        45.500 cm/s LVOT VTI:          0.127 m LVOT/AV VTI ratio: 0.72  AORTA Ao Root diam: 3.43 cm MITRAL VALVE                TRICUSPID VALVE MV Area (PHT): 9.03 cm     TR Peak grad:   34.8 mmHg MV Decel Time: 84 msec      TR Vmax:        295.00 cm/s MV E velocity: 111.00 cm/s MV A velocity: 138.00 cm/s  SHUNTS MV E/A ratio:  0.80         Systemic VTI:  0.13 m                             Systemic Diam: 2.00 cm Nelva Bush MD Electronically signed by Nelva Bush MD Signature Date/Time: 02/08/2020/9:30:21 AM    Final    US Abdomen Limited RUQ  (LIVER/GB)  Result Date: 02/08/2020 CLINICAL DATA:  NASH. EXAM: ULTRASOUND ABDOMEN LIMITED RIGHT UPPER QUADRANT COMPARISON:  Ultrasound 04/05/2019. FINDINGS: Gallbladder: Cholecystectomy. Common bile duct: Diameter: 6.5 mm Liver: Mild increased hepatic echogenicity. Nodular hepatic border suggesting cirrhosis. No focal hepatic abnormality identified. Portal vein is patent on color Doppler imaging with normal direction of blood flow towards the liver. Other: Small amount of ascites noted. Incidental note made of right pleural effusion. IMPRESSION: 1. Cholecystectomy. No biliary distention. 2. Mild increased hepatic echogenicity. Nodular hepatic border suggesting cirrhosis. No focal hepatic abnormality identified. Portal vein patent with normal direction of flow. 3. Small amount of ascites. Right pleural effusion noted. Electronically Signed   By: Marcello Moores  Register   On: 02/08/2020 07:15     Subjective: Patient was feeling better when seen today.  Denies any chest pain or shortness of breath.  Palpitation has been improved.  Discharge Exam: Vitals:   02/08/20 1100 02/08/20 1209  BP:  109/78  Pulse: 85 (!) 101  Resp: (!) 23 (!) 21  Temp:    SpO2: 95% 100%   Vitals:   02/08/20 0500 02/08/20 0800 02/08/20 1100 02/08/20 1209  BP: 102/70 (!) 107/47  109/78  Pulse: 85 78 85 (!) 101  Resp: (!) 22 19 (!) 23 (!) 21  Temp:  98.7 F (37.1 C)    TempSrc:  Oral    SpO2: 96% 93% 95% 100%  Weight:      Height:        General: Pt is alert, awake, not in acute distress Cardiovascular: Irregularly irregular Respiratory: CTA bilaterally, no wheezing, no rhonchi Abdominal: Soft, NT, ND, bowel sounds + Extremities: no edema, no cyanosis   The results of significant diagnostics from this hospitalization (including imaging, microbiology, ancillary and laboratory) are listed below for reference.    Microbiology: Recent  Results (from the past 240 hour(s))  Resp Panel by RT-PCR (Flu A&B, Covid)  Nasopharyngeal Swab     Status: None   Collection Time: 02/07/20  2:52 PM   Specimen: Nasopharyngeal Swab; Nasopharyngeal(NP) swabs in vial transport medium  Result Value Ref Range Status   SARS Coronavirus 2 by RT PCR NEGATIVE NEGATIVE Final    Comment: (NOTE) SARS-CoV-2 target nucleic acids are NOT DETECTED.  The SARS-CoV-2 RNA is generally detectable in upper respiratory specimens during the acute phase of infection. The lowest concentration of SARS-CoV-2 viral copies this assay can detect is 138 copies/mL. A negative result does not preclude SARS-Cov-2 infection and should not be used as the sole basis for treatment or other patient management decisions. A negative result may occur with  improper specimen collection/handling, submission of specimen other than nasopharyngeal swab, presence of viral mutation(s) within the areas targeted by this assay, and inadequate number of viral copies(<138 copies/mL). A negative result must be combined with clinical observations, patient history, and epidemiological information. The expected result is Negative.  Fact Sheet for Patients:  EntrepreneurPulse.com.au  Fact Sheet for Healthcare Providers:  IncredibleEmployment.be  This test is no t yet approved or cleared by the [place] FDA and  has been authorized for detection and/or diagnosis of SARS-CoV-2 by FDA under an Emergency Use Authorization (EUA). This EUA will remain  in effect (meaning this test can be used) for the duration of the COVID-19 declaration under Section 564(b)(1) of the Act, 21 U.S.C.section 360bbb-3(b)(1), unless the authorization is terminated  or revoked sooner.       Influenza A by PCR NEGATIVE NEGATIVE Final   Influenza B by PCR NEGATIVE NEGATIVE Final    Comment: (NOTE) The Xpert Xpress SARS-CoV-2/FLU/RSV plus assay is intended as an aid in the diagnosis of influenza from Nasopharyngeal swab specimens and should not be  used as a sole basis for treatment. Nasal washings and aspirates are unacceptable for Xpert Xpress SARS-CoV-2/FLU/RSV testing.  Fact Sheet for Patients: EntrepreneurPulse.com.au  Fact Sheet for Healthcare Providers: IncredibleEmployment.be  This test is not yet approved or cleared by the [place] FDA and has been authorized for detection and/or diagnosis of SARS-CoV-2 by FDA under an Emergency Use Authorization (EUA). This EUA will remain in effect (meaning this test can be used) for the duration of the COVID-19 declaration under Section 564(b)(1) of the Act, 21 U.S.C. section 360bbb-3(b)(1), unless the authorization is terminated or revoked.  Performed at [facility], [address] [place], [place] [address]      Labs: BNP (last 3 results) Recent Labs    02/07/20 1451  BNP 193.7*   Basic Metabolic Panel: Recent Labs  Lab 02/07/20 1451 02/07/20 1452 02/08/20 0303  NA  --  135 134*  K  --  4.0 3.7  CL  --  99 101  CO2  --  28 25  GLUCOSE  --  230* 138*  BUN  --  17 17  CREATININE  --  0.79 0.86  CALCIUM  --  9.2 8.9  MG 2.3  --   --    Liver Function Tests: Recent Labs  Lab 02/07/20 1452  AST 30  ALT 20  ALKPHOS 44  BILITOT 1.0  PROT 8.0  ALBUMIN 3.8   No results for input(s): LIPASE, AMYLASE in the last 168 hours. No results for input(s): AMMONIA in the last 168 hours. CBC: Recent Labs  Lab 02/07/20 1452 02/08/20 0303  WBC 3.2* 3.3*  NEUTROABS 1.8  --  HGB 11.4* 11.0*  HCT 35.3* 33.7*  MCV 94.4 93.1  PLT 102* 93*   Cardiac Enzymes: No results for input(s): CKTOTAL, CKMB, CKMBINDEX, TROPONINI in the last 168 hours. BNP: Invalid input(s): POCBNP CBG: Recent Labs  Lab 02/07/20 1629 02/07/20 1951 02/07/20 2214 02/08/20 0818 02/08/20 1154  GLUCAP 149* 222* 225* 118* 205*   D-Dimer No results for input(s): DDIMER in the last 72 hours. Hgb A1c Recent Labs    02/07/20 1720  HGBA1C  9.1*   Lipid Profile No results for input(s): CHOL, HDL, LDLCALC, TRIG, CHOLHDL, LDLDIRECT in the last 72 hours. Thyroid function studies Recent Labs    02/07/20 1451  TSH 3.169   Anemia work up No results for input(s): VITAMINB12, FOLATE, FERRITIN, TIBC, IRON, RETICCTPCT in the last 72 hours. Urinalysis    Component Value Date/Time   COLORURINE YELLOW (A) 06/15/2019 1039   APPEARANCEUR CLEAR (A) 06/15/2019 1039   APPEARANCEUR Cloudy (A) 07/08/2017 1103   LABSPEC 1.014 06/15/2019 1039   PHURINE 6.0 06/15/2019 1039   GLUCOSEU Large (3+) (A) 08/18/2019 1433   GLUCOSEU NEGATIVE 06/15/2019 1039   HGBUR NEGATIVE 06/15/2019 1039   BILIRUBINUR neg 09/30/2019 0911   BILIRUBINUR Negative 07/08/2017 1103   KETONESUR NEGATIVE 06/15/2019 1039   PROTEINUR Negative 09/30/2019 0911   PROTEINUR NEGATIVE 06/15/2019 1039   UROBILINOGEN 0.2 09/30/2019 0911   NITRITE neg 09/30/2019 0911   NITRITE NEGATIVE 06/15/2019 1039   LEUKOCYTESUR Negative 09/30/2019 0911   LEUKOCYTESUR TRACE (A) 06/15/2019 1039   Sepsis Labs Invalid input(s): PROCALCITONIN,  WBC,  LACTICIDVEN Microbiology Recent Results (from the past 240 hour(s))  Resp Panel by RT-PCR (Flu A&B, Covid) Nasopharyngeal Swab     Status: None   Collection Time: 02/07/20  2:52 PM   Specimen: Nasopharyngeal Swab; Nasopharyngeal(NP) swabs in vial transport medium  Result Value Ref Range Status   SARS Coronavirus 2 by RT PCR NEGATIVE NEGATIVE Final    Comment: (NOTE) SARS-CoV-2 target nucleic acids are NOT DETECTED.  The SARS-CoV-2 RNA is generally detectable in upper respiratory specimens during the acute phase of infection. The lowest concentration of SARS-CoV-2 viral copies this assay can detect is 138 copies/mL. A negative result does not preclude SARS-Cov-2 infection and should not be used as the sole basis for treatment or other patient management decisions. A negative result may occur with  improper specimen collection/handling,  submission of specimen other than nasopharyngeal swab, presence of viral mutation(s) within the areas targeted by this assay, and inadequate number of viral copies(<138 copies/mL). A negative result must be combined with clinical observations, patient history, and epidemiological information. The expected result is Negative.  Fact Sheet for Patients:  EntrepreneurPulse.com.au  Fact Sheet for Healthcare Providers:  IncredibleEmployment.be  This test is no t yet approved or cleared by the [place] FDA and  has been authorized for detection and/or diagnosis of SARS-CoV-2 by FDA under an Emergency Use Authorization (EUA). This EUA will remain  in effect (meaning this test can be used) for the duration of the COVID-19 declaration under Section 564(b)(1) of the Act, 21 U.S.C.section 360bbb-3(b)(1), unless the authorization is terminated  or revoked sooner.       Influenza A by PCR NEGATIVE NEGATIVE Final   Influenza B by PCR NEGATIVE NEGATIVE Final    Comment: (NOTE) The Xpert Xpress SARS-CoV-2/FLU/RSV plus assay is intended as an aid in the diagnosis of influenza from Nasopharyngeal swab specimens and should not be used as a sole basis for treatment. Nasal washings and aspirates are  unacceptable for Xpert Xpress SARS-CoV-2/FLU/RSV testing.  Fact Sheet for Patients: EntrepreneurPulse.com.au  Fact Sheet for Healthcare Providers: IncredibleEmployment.be  This test is not yet approved or cleared by the [place] FDA and has been authorized for detection and/or diagnosis of SARS-CoV-2 by FDA under an Emergency Use Authorization (EUA). This EUA will remain in effect (meaning this test can be used) for the duration of the COVID-19 declaration under Section 564(b)(1) of the Act, 21 U.S.C. section 360bbb-3(b)(1), unless the authorization is terminated or revoked.  Performed at [facility], [address] [place], [place] [address]     Time coordinating discharge: Over 30 minutes  SIGNED:  Lorella Nimrod, MD  [facility] 02/08/2020, 1:19 PM  If 7PM-7AM, please contact night-coverage [email or web address]  This record has been created using Systems analyst. Errors have been sought and corrected,but may not always be located. Such creation errors do not reflect on the standard of care.

## 2020-02-08 NOTE — ED Notes (Signed)
Redraw of blue top for Heparin level sent to lab for testing.

## 2020-02-09 ENCOUNTER — Other Ambulatory Visit: Payer: Self-pay | Admitting: Internal Medicine

## 2020-02-11 ENCOUNTER — Other Ambulatory Visit
Admission: RE | Admit: 2020-02-11 | Discharge: 2020-02-11 | Disposition: A | Discharge status: Home / Self Care | Payer: Medicare PPO | Source: Ambulatory Visit | Attending: Family Medicine | Admitting: Family Medicine

## 2020-02-11 DIAGNOSIS — I4891 Unspecified atrial fibrillation: Secondary | ICD-10-CM | POA: Insufficient documentation

## 2020-02-11 LAB — BASIC METABOLIC PANEL
Anion gap: 7 (ref 5–15)
BUN: 18 mg/dL (ref 8–23)
CO2: 25 mmol/L (ref 22–32)
Calcium: 8.9 mg/dL (ref 8.9–10.3)
Chloride: 99 mmol/L (ref 98–111)
Creatinine, Ser: 0.82 mg/dL (ref 0.44–1.00)
GFR, Estimated: >60 mL/min (ref >60)
Glucose, Bld: 414 mg/dL — ABNORMAL HIGH (ref 70–99)
Potassium: 4.9 mmol/L (ref 3.5–5.1)
Sodium: 131 mmol/L — ABNORMAL LOW (ref 135–145)

## 2020-02-11 LAB — DIGOXIN LEVEL: Digoxin Level: 1 ng/mL (ref 0.8–2.0)

## 2020-02-14 ENCOUNTER — Telehealth: Payer: Self-pay | Admitting: *Deleted

## 2020-02-14 MED ORDER — DIGOXIN 125 MCG PO TABS: 0.0625 mg | ORAL_TABLET | Freq: Every day | ORAL | 1 refills | Status: DC

## 2020-02-14 NOTE — Telephone Encounter (Addendum)
Spoke with patient and reviewed to stop taking the digoxin and recommendations that if her heart rates remain greater than 100 to call back and we can make some additional medication changes. Reviewed those changes with her but since HR today was 58 to continue monitoring. Instructed her to call back if HR is elevated and we can make some additional changes and to keep her appointment on Friday. She verbalized understanding of our conversation, agreement with plan, and had no further questions at this time.

## 2020-02-14 NOTE — Telephone Encounter (Addendum)
Reviewed results and recommendations with patient and she states she has appointment today with her primary care provider. She reports swollen face and states it looks horrible. Inquired if she has started anything new and she states the digoxin is new. Since we are decreasing that dose in half hoping that will help. Confirmed her upcoming appointment this Friday. We discussed medication change for Digoxin to 1/2 tablet daily as well. She read back information with no further questions at this time. Will send to provider regarding swelling she is having. She was appreciative for the call and update.

## 2020-02-14 NOTE — Telephone Encounter (Signed)
-----   Message from Rise Mu, PA-C sent at 02/13/2020 10:50 AM EST ----- Digoxin level is high normal.  Corrected sodium 136 (normal). Potassium at goal.  Renal function is normal.   Recommendations: -Reduce digoxin to 0.0625 mg daily (take 1/2 tab of 0.125 mg) -Follow up with PCP for poorly controlled diabetes  -Follow up as scheduled this upcoming week

## 2020-02-14 NOTE — Telephone Encounter (Signed)
Please have her stop digoxin as this may be the culprit. With this, her heart rates may trend upwards. If she sees her heart rates sustaining > 100 bpm, she can increase her Lopressor to 50 mg bid. With this, she would also need to hold valsartan to allow for added BP room to titrate Lopressor.

## 2020-02-14 NOTE — Addendum Note (Signed)
Addended by: Valora Corporal on: 02/14/2020 09:35 AM   Modules accepted: Orders

## 2020-02-16 ENCOUNTER — Other Ambulatory Visit: Payer: Self-pay

## 2020-02-16 ENCOUNTER — Telehealth: Payer: Self-pay | Admitting: Physician Assistant

## 2020-02-16 ENCOUNTER — Emergency Department: Payer: Medicare Other

## 2020-02-16 DIAGNOSIS — K529 Noninfective gastroenteritis and colitis, unspecified: Principal | ICD-10-CM | POA: Diagnosis present

## 2020-02-16 DIAGNOSIS — Z8249 Family history of ischemic heart disease and other diseases of the circulatory system: Secondary | ICD-10-CM

## 2020-02-16 DIAGNOSIS — K7581 Nonalcoholic steatohepatitis (NASH): Secondary | ICD-10-CM | POA: Diagnosis present

## 2020-02-16 DIAGNOSIS — D62 Acute posthemorrhagic anemia: Secondary | ICD-10-CM | POA: Diagnosis present

## 2020-02-16 DIAGNOSIS — I110 Hypertensive heart disease with heart failure: Secondary | ICD-10-CM | POA: Diagnosis present

## 2020-02-16 DIAGNOSIS — K635 Polyp of colon: Secondary | ICD-10-CM | POA: Diagnosis not present

## 2020-02-16 DIAGNOSIS — K641 Second degree hemorrhoids: Secondary | ICD-10-CM | POA: Diagnosis not present

## 2020-02-16 DIAGNOSIS — K7460 Unspecified cirrhosis of liver: Secondary | ICD-10-CM | POA: Diagnosis present

## 2020-02-16 DIAGNOSIS — K766 Portal hypertension: Secondary | ICD-10-CM | POA: Diagnosis not present

## 2020-02-16 DIAGNOSIS — D61818 Other pancytopenia: Secondary | ICD-10-CM | POA: Diagnosis not present

## 2020-02-16 DIAGNOSIS — I5032 Chronic diastolic (congestive) heart failure: Secondary | ICD-10-CM | POA: Diagnosis not present

## 2020-02-16 DIAGNOSIS — E785 Hyperlipidemia, unspecified: Secondary | ICD-10-CM | POA: Diagnosis present

## 2020-02-16 DIAGNOSIS — D509 Iron deficiency anemia, unspecified: Secondary | ICD-10-CM | POA: Diagnosis present

## 2020-02-16 DIAGNOSIS — K219 Gastro-esophageal reflux disease without esophagitis: Secondary | ICD-10-CM | POA: Diagnosis not present

## 2020-02-16 DIAGNOSIS — G4733 Obstructive sleep apnea (adult) (pediatric): Secondary | ICD-10-CM | POA: Diagnosis not present

## 2020-02-16 DIAGNOSIS — I4821 Permanent atrial fibrillation: Secondary | ICD-10-CM | POA: Diagnosis present

## 2020-02-16 DIAGNOSIS — Z20822 Contact with and (suspected) exposure to covid-19: Secondary | ICD-10-CM | POA: Diagnosis not present

## 2020-02-16 DIAGNOSIS — I851 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 bleeding: Secondary | ICD-10-CM | POA: Diagnosis present

## 2020-02-16 DIAGNOSIS — N179 Acute kidney failure, unspecified: Secondary | ICD-10-CM | POA: Diagnosis not present

## 2020-02-16 DIAGNOSIS — R188 Other ascites: Secondary | ICD-10-CM | POA: Diagnosis not present

## 2020-02-16 DIAGNOSIS — E119 Type 2 diabetes mellitus without complications: Secondary | ICD-10-CM | POA: Diagnosis not present

## 2020-02-16 DIAGNOSIS — Z853 Personal history of malignant neoplasm of breast: Secondary | ICD-10-CM

## 2020-02-16 DIAGNOSIS — Z9012 Acquired absence of left breast and nipple: Secondary | ICD-10-CM

## 2020-02-16 DIAGNOSIS — Z803 Family history of malignant neoplasm of breast: Secondary | ICD-10-CM

## 2020-02-16 DIAGNOSIS — Z800 Family history of malignant neoplasm of digestive organs: Secondary | ICD-10-CM

## 2020-02-16 DIAGNOSIS — E039 Hypothyroidism, unspecified: Secondary | ICD-10-CM | POA: Diagnosis present

## 2020-02-16 DIAGNOSIS — K3189 Other diseases of stomach and duodenum: Secondary | ICD-10-CM | POA: Diagnosis present

## 2020-02-16 DIAGNOSIS — Z833 Family history of diabetes mellitus: Secondary | ICD-10-CM

## 2020-02-16 DIAGNOSIS — Z923 Personal history of irradiation: Secondary | ICD-10-CM

## 2020-02-16 DIAGNOSIS — Z885 Allergy status to narcotic agent status: Secondary | ICD-10-CM

## 2020-02-16 DIAGNOSIS — E669 Obesity, unspecified: Secondary | ICD-10-CM | POA: Diagnosis present

## 2020-02-16 DIAGNOSIS — Z888 Allergy status to other drugs, medicaments and biological substances status: Secondary | ICD-10-CM

## 2020-02-16 DIAGNOSIS — Z8042 Family history of malignant neoplasm of prostate: Secondary | ICD-10-CM

## 2020-02-16 DIAGNOSIS — Z6831 Body mass index (BMI) 31.0-31.9, adult: Secondary | ICD-10-CM

## 2020-02-16 DIAGNOSIS — I440 Atrioventricular block, first degree: Secondary | ICD-10-CM | POA: Diagnosis present

## 2020-02-16 DIAGNOSIS — Z8052 Family history of malignant neoplasm of bladder: Secondary | ICD-10-CM

## 2020-02-16 DIAGNOSIS — Z794 Long term (current) use of insulin: Secondary | ICD-10-CM

## 2020-02-16 DIAGNOSIS — G40909 Epilepsy, unspecified, not intractable, without status epilepticus: Secondary | ICD-10-CM | POA: Diagnosis present

## 2020-02-16 DIAGNOSIS — K921 Melena: Secondary | ICD-10-CM | POA: Diagnosis present

## 2020-02-16 DIAGNOSIS — Z7901 Long term (current) use of anticoagulants: Secondary | ICD-10-CM

## 2020-02-16 DIAGNOSIS — Z79899 Other long term (current) drug therapy: Secondary | ICD-10-CM

## 2020-02-16 DIAGNOSIS — Z806 Family history of leukemia: Secondary | ICD-10-CM

## 2020-02-16 LAB — BRAIN NATRIURETIC PEPTIDE: B Natriuretic Peptide: 377.8 pg/mL — ABNORMAL HIGH (ref 0.0–100.0)

## 2020-02-16 LAB — CBC
HCT: 34.3 % — ABNORMAL LOW (ref 36.0–46.0)
Hemoglobin: 10.8 g/dL — ABNORMAL LOW (ref 12.0–15.0)
MCH: 30.3 pg (ref 26.0–34.0)
MCHC: 31.5 g/dL (ref 30.0–36.0)
MCV: 96.3 fL (ref 80.0–100.0)
Platelets: 119 10*3/uL — ABNORMAL LOW (ref 150–400)
RBC: 3.56 MIL/uL — ABNORMAL LOW (ref 3.87–5.11)
RDW: 14.4 % (ref 11.5–15.5)
WBC: 2.8 10*3/uL — ABNORMAL LOW (ref 4.0–10.5)
nRBC: 0 % (ref 0.0–0.2)

## 2020-02-16 LAB — BASIC METABOLIC PANEL
Anion gap: 9 (ref 5–15)
BUN: 32 mg/dL — ABNORMAL HIGH (ref 8–23)
CO2: 27 mmol/L (ref 22–32)
Calcium: 8.5 mg/dL — ABNORMAL LOW (ref 8.9–10.3)
Chloride: 101 mmol/L (ref 98–111)
Creatinine, Ser: 1.18 mg/dL — ABNORMAL HIGH (ref 0.44–1.00)
GFR, Estimated: 47 mL/min — ABNORMAL LOW (ref >60)
Glucose, Bld: 215 mg/dL — ABNORMAL HIGH (ref 70–99)
Potassium: 4.1 mmol/L (ref 3.5–5.1)
Sodium: 137 mmol/L (ref 135–145)

## 2020-02-16 LAB — PROTIME-INR
INR: 1.3 — ABNORMAL HIGH (ref 0.8–1.2)
Prothrombin Time: 15.7 seconds — ABNORMAL HIGH (ref 11.4–15.2)

## 2020-02-16 LAB — TROPONIN I (HIGH SENSITIVITY)
Troponin I (High Sensitivity): 15 ng/L (ref <18)
Troponin I (High Sensitivity): 13 ng/L (ref <18)

## 2020-02-16 NOTE — ED Triage Notes (Signed)
Pt comes pov with rectal bleeding for a few days, SOB, afib, abdominal distension, swelling of feet and eyes. Sent by cardiologist.

## 2020-02-16 NOTE — Telephone Encounter (Signed)
Patient states she was discharged from the hospital last week and was placed on Eliquis. States she has bled some this week, but last night when she went to the bathroom , the toilet was nothing but blood. States she is still bleeding some this morning. Patient states she did not take the Eliqus this morning. Patient states she is not having any other symptoms. Please call to discuss.

## 2020-02-16 NOTE — Telephone Encounter (Signed)
Spoke with the patient. Patient was d/c from the hospital on 02/08/20 on Eliquis. Patient sts that starting yesterday morning she had rectal bleeding associated with her bowel movement. The blood was bright red. Last night she had another bowel movement with bright red blood. She held her Eliquis last night and this morning.  Patient repots that the had a bowl movement this morning with dark colored blood. She denies any other sighs of bleeding or any other symptoms.  Adv her that I will discuss with a provider in our office and call back with their recommendation. Discussed with our DOD Dr. Saunders Revel. Patient has a hx of colitis. Risk vs Benefit was discussed with GI during the pt hospitalization. Patient should HOLD Eliquis and contact her GI physician for their recommendation regarding Eliquis. Patient is to report to the ED if bleeding continues.  Patient made aware of Dr. Darnelle Bos recommendation and is agreeable with the plan.  Patient mentioned during our second phone conversation that she has increase swelling of the face. She denies any swelling of the mouth or sob. She reports swelling and distention of her abdomen. Adv the patient that based on her reported symptoms I would recommend that she go to the ED for evaluation. Adv the patient that I talked with Dr. Saunders Revel and he agrees with my assessment.  Patient is agreeable and will report to [facility] ED to get evaluated.

## 2020-02-17 ENCOUNTER — Emergency Department: Payer: Medicare Other

## 2020-02-17 ENCOUNTER — Inpatient Hospital Stay
Admission: EM | Admit: 2020-02-17 | Discharge: 2020-02-18 | DRG: 392 | Disposition: A | Discharge status: Home / Self Care | Payer: Medicare Other | Attending: Family Medicine | Admitting: Family Medicine

## 2020-02-17 DIAGNOSIS — I480 Paroxysmal atrial fibrillation: Secondary | ICD-10-CM

## 2020-02-17 DIAGNOSIS — D509 Iron deficiency anemia, unspecified: Secondary | ICD-10-CM | POA: Diagnosis present

## 2020-02-17 DIAGNOSIS — I440 Atrioventricular block, first degree: Secondary | ICD-10-CM | POA: Diagnosis present

## 2020-02-17 DIAGNOSIS — K635 Polyp of colon: Secondary | ICD-10-CM

## 2020-02-17 DIAGNOSIS — R601 Generalized edema: Secondary | ICD-10-CM

## 2020-02-17 DIAGNOSIS — G4733 Obstructive sleep apnea (adult) (pediatric): Secondary | ICD-10-CM | POA: Diagnosis present

## 2020-02-17 DIAGNOSIS — E039 Hypothyroidism, unspecified: Secondary | ICD-10-CM | POA: Diagnosis present

## 2020-02-17 DIAGNOSIS — E785 Hyperlipidemia, unspecified: Secondary | ICD-10-CM | POA: Diagnosis present

## 2020-02-17 DIAGNOSIS — R140 Abdominal distension (gaseous): Secondary | ICD-10-CM | POA: Diagnosis not present

## 2020-02-17 DIAGNOSIS — D61818 Other pancytopenia: Secondary | ICD-10-CM | POA: Diagnosis present

## 2020-02-17 DIAGNOSIS — K922 Gastrointestinal hemorrhage, unspecified: Secondary | ICD-10-CM | POA: Insufficient documentation

## 2020-02-17 DIAGNOSIS — K641 Second degree hemorrhoids: Secondary | ICD-10-CM | POA: Diagnosis present

## 2020-02-17 DIAGNOSIS — I10 Essential (primary) hypertension: Secondary | ICD-10-CM | POA: Diagnosis not present

## 2020-02-17 DIAGNOSIS — G40909 Epilepsy, unspecified, not intractable, without status epilepticus: Secondary | ICD-10-CM | POA: Diagnosis present

## 2020-02-17 DIAGNOSIS — I4821 Permanent atrial fibrillation: Secondary | ICD-10-CM | POA: Diagnosis present

## 2020-02-17 DIAGNOSIS — Z7901 Long term (current) use of anticoagulants: Secondary | ICD-10-CM

## 2020-02-17 DIAGNOSIS — R188 Other ascites: Secondary | ICD-10-CM | POA: Diagnosis present

## 2020-02-17 DIAGNOSIS — K219 Gastro-esophageal reflux disease without esophagitis: Secondary | ICD-10-CM | POA: Diagnosis present

## 2020-02-17 DIAGNOSIS — N179 Acute kidney failure, unspecified: Secondary | ICD-10-CM | POA: Diagnosis present

## 2020-02-17 DIAGNOSIS — R001 Bradycardia, unspecified: Secondary | ICD-10-CM

## 2020-02-17 DIAGNOSIS — Z8719 Personal history of other diseases of the digestive system: Secondary | ICD-10-CM

## 2020-02-17 DIAGNOSIS — K766 Portal hypertension: Secondary | ICD-10-CM | POA: Diagnosis present

## 2020-02-17 DIAGNOSIS — K921 Melena: Secondary | ICD-10-CM | POA: Diagnosis present

## 2020-02-17 DIAGNOSIS — I851 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 bleeding: Secondary | ICD-10-CM | POA: Diagnosis present

## 2020-02-17 DIAGNOSIS — Z20822 Contact with and (suspected) exposure to covid-19: Secondary | ICD-10-CM | POA: Diagnosis present

## 2020-02-17 DIAGNOSIS — K529 Noninfective gastroenteritis and colitis, unspecified: Secondary | ICD-10-CM | POA: Diagnosis present

## 2020-02-17 DIAGNOSIS — K3189 Other diseases of stomach and duodenum: Secondary | ICD-10-CM | POA: Diagnosis present

## 2020-02-17 DIAGNOSIS — D62 Acute posthemorrhagic anemia: Secondary | ICD-10-CM | POA: Diagnosis present

## 2020-02-17 DIAGNOSIS — K7581 Nonalcoholic steatohepatitis (NASH): Secondary | ICD-10-CM | POA: Diagnosis present

## 2020-02-17 DIAGNOSIS — Z794 Long term (current) use of insulin: Secondary | ICD-10-CM

## 2020-02-17 DIAGNOSIS — I509 Heart failure, unspecified: Secondary | ICD-10-CM

## 2020-02-17 DIAGNOSIS — I110 Hypertensive heart disease with heart failure: Secondary | ICD-10-CM | POA: Diagnosis present

## 2020-02-17 DIAGNOSIS — I5032 Chronic diastolic (congestive) heart failure: Secondary | ICD-10-CM | POA: Diagnosis present

## 2020-02-17 DIAGNOSIS — K7460 Unspecified cirrhosis of liver: Secondary | ICD-10-CM | POA: Diagnosis present

## 2020-02-17 DIAGNOSIS — E119 Type 2 diabetes mellitus without complications: Secondary | ICD-10-CM

## 2020-02-17 DIAGNOSIS — I4891 Unspecified atrial fibrillation: Secondary | ICD-10-CM

## 2020-02-17 LAB — HEPATIC FUNCTION PANEL
ALT: 18 U/L (ref 0–44)
AST: 25 U/L (ref 15–41)
Albumin: 3.7 g/dL (ref 3.5–5.0)
Alkaline Phosphatase: 38 U/L (ref 38–126)
Bilirubin, Direct: 0.2 mg/dL (ref 0.0–0.2)
Indirect Bilirubin: 0.6 mg/dL (ref 0.3–0.9)
Total Bilirubin: 0.8 mg/dL (ref 0.3–1.2)
Total Protein: 7.7 g/dL (ref 6.5–8.1)

## 2020-02-17 LAB — CBC
HCT: 30.7 % — ABNORMAL LOW (ref 36.0–46.0)
Hemoglobin: 10 g/dL — ABNORMAL LOW (ref 12.0–15.0)
MCH: 30.9 pg (ref 26.0–34.0)
MCHC: 32.6 g/dL (ref 30.0–36.0)
MCV: 94.8 fL (ref 80.0–100.0)
Platelets: 87 10*3/uL — ABNORMAL LOW (ref 150–400)
RBC: 3.24 MIL/uL — ABNORMAL LOW (ref 3.87–5.11)
RDW: 14.3 % (ref 11.5–15.5)
WBC: 2 10*3/uL — ABNORMAL LOW (ref 4.0–10.5)
nRBC: 0 % (ref 0.0–0.2)

## 2020-02-17 LAB — RESP PANEL BY RT-PCR (FLU A&B, COVID) ARPGX2
Influenza A by PCR: NEGATIVE
Influenza B by PCR: NEGATIVE
SARS Coronavirus 2 by RT PCR: NEGATIVE

## 2020-02-17 LAB — BASIC METABOLIC PANEL
Anion gap: 9 (ref 5–15)
BUN: 21 mg/dL (ref 8–23)
CO2: 26 mmol/L (ref 22–32)
Calcium: 8.6 mg/dL — ABNORMAL LOW (ref 8.9–10.3)
Chloride: 101 mmol/L (ref 98–111)
Creatinine, Ser: 0.86 mg/dL (ref 0.44–1.00)
GFR, Estimated: >60 mL/min (ref >60)
Glucose, Bld: 224 mg/dL — ABNORMAL HIGH (ref 70–99)
Potassium: 3.8 mmol/L (ref 3.5–5.1)
Sodium: 136 mmol/L (ref 135–145)

## 2020-02-17 LAB — TYPE AND SCREEN
ABO/RH(D): A POS
Antibody Screen: NEGATIVE

## 2020-02-17 LAB — CBG MONITORING, ED
Glucose-Capillary: 148 mg/dL — ABNORMAL HIGH (ref 70–99)
Glucose-Capillary: 228 mg/dL — ABNORMAL HIGH (ref 70–99)

## 2020-02-17 LAB — GLUCOSE, CAPILLARY
Glucose-Capillary: 176 mg/dL — ABNORMAL HIGH (ref 70–99)
Glucose-Capillary: 160 mg/dL — ABNORMAL HIGH (ref 70–99)

## 2020-02-17 LAB — LIPASE, BLOOD: Lipase: 57 U/L — ABNORMAL HIGH (ref 11–51)

## 2020-02-17 LAB — DIGOXIN LEVEL: Digoxin Level: 0.5 ng/mL — ABNORMAL LOW (ref 0.8–2.0)

## 2020-02-17 MED ORDER — ACETAMINOPHEN 325 MG PO TABS: 650.0000 mg | ORAL_TABLET | Freq: Four times a day (QID) | ORAL | Status: DC | PRN

## 2020-02-17 MED ORDER — HYOSCYAMINE SULFATE ER 0.375 MG PO TB12
0.3750 mg | ORAL_TABLET | Freq: Two times a day (BID) | ORAL | Status: DC
  Administered 2020-02-17: 0.375 mg via ORAL
  Filled 2020-02-17 (×3): qty 1

## 2020-02-17 MED ORDER — ESCITALOPRAM OXALATE 10 MG PO TABS
5.0000 mg | ORAL_TABLET | Freq: Every day | ORAL | Status: DC
  Administered 2020-02-17: 21:00:00 5 mg via ORAL
  Filled 2020-02-17 (×2): qty 0.5

## 2020-02-17 MED ORDER — INSULIN ASPART 100 UNIT/ML ~~LOC~~ SOLN
0.0000 [IU] | Freq: Three times a day (TID) | SUBCUTANEOUS | Status: DC
  Administered 2020-02-17: 5 [IU] via SUBCUTANEOUS
  Administered 2020-02-17: 18:00:00 3 [IU] via SUBCUTANEOUS
  Administered 2020-02-17: 2 [IU] via SUBCUTANEOUS
  Filled 2020-02-17 (×3): qty 1

## 2020-02-17 MED ORDER — LEVOTHYROXINE SODIUM 50 MCG PO TABS
75.0000 ug | ORAL_TABLET | Freq: Every day | ORAL | Status: DC
  Administered 2020-02-18: 06:00:00 75 ug via ORAL
  Filled 2020-02-17: qty 1

## 2020-02-17 MED ORDER — FENOFIBRATE 160 MG PO TABS
160.0000 mg | ORAL_TABLET | Freq: Every day | ORAL | Status: DC
  Administered 2020-02-17: 160 mg via ORAL
  Filled 2020-02-17 (×2): qty 1

## 2020-02-17 MED ORDER — INSULIN ASPART 100 UNIT/ML ~~LOC~~ SOLN: 0.0000 [IU] | Freq: Every day | SUBCUTANEOUS | Status: DC

## 2020-02-17 MED ORDER — DICYCLOMINE HCL 10 MG PO CAPS
10.0000 mg | ORAL_CAPSULE | Freq: Every day | ORAL | Status: DC
  Administered 2020-02-17: 10 mg via ORAL
  Filled 2020-02-17 (×2): qty 1

## 2020-02-17 MED ORDER — POTASSIUM CHLORIDE CRYS ER 20 MEQ PO TBCR
20.0000 meq | EXTENDED_RELEASE_TABLET | Freq: Two times a day (BID) | ORAL | Status: DC
  Administered 2020-02-17: 20 meq via ORAL
  Filled 2020-02-17: qty 1

## 2020-02-17 MED ORDER — SODIUM CHLORIDE 0.9 % IV SOLN
80.0000 mg | Freq: Once | INTRAVENOUS | Status: AC
  Administered 2020-02-17: 80 mg via INTRAVENOUS
  Filled 2020-02-17: qty 80

## 2020-02-17 MED ORDER — IRBESARTAN 150 MG PO TABS: 150.0000 mg | ORAL_TABLET | Freq: Every day | ORAL | Status: DC

## 2020-02-17 MED ORDER — FUROSEMIDE 10 MG/ML IJ SOLN
20.0000 mg | Freq: Once | INTRAMUSCULAR | Status: AC
  Administered 2020-02-17: 20 mg via INTRAVENOUS
  Filled 2020-02-17: qty 4

## 2020-02-17 MED ORDER — SIMVASTATIN 20 MG PO TABS
20.0000 mg | ORAL_TABLET | Freq: Every day | ORAL | Status: DC
  Administered 2020-02-17: 20 mg via ORAL
  Filled 2020-02-17: qty 1

## 2020-02-17 MED ORDER — HYDRALAZINE HCL 50 MG PO TABS
25.0000 mg | ORAL_TABLET | Freq: Three times a day (TID) | ORAL | Status: DC | PRN
Start: 2020-02-17 — End: 2020-02-18

## 2020-02-17 MED ORDER — ACETAMINOPHEN 650 MG RE SUPP: 650.0000 mg | Freq: Four times a day (QID) | RECTAL | Status: DC | PRN

## 2020-02-17 MED ORDER — ONDANSETRON HCL 4 MG/2ML IJ SOLN
4.0000 mg | Freq: Four times a day (QID) | INTRAMUSCULAR | Status: DC | PRN
  Administered 2020-02-17: 21:00:00 4 mg via INTRAVENOUS
  Filled 2020-02-17: qty 2

## 2020-02-17 MED ORDER — PEG 3350-KCL-NA BICARB-NACL 420 G PO SOLR
4000.0000 mL | Freq: Once | ORAL | Status: AC
  Administered 2020-02-17: 18:00:00 4000 mL via ORAL
  Filled 2020-02-17: qty 4000

## 2020-02-17 MED ORDER — ONDANSETRON HCL 4 MG PO TABS: 4.0000 mg | ORAL_TABLET | Freq: Four times a day (QID) | ORAL | Status: DC | PRN

## 2020-02-17 MED ORDER — SODIUM CHLORIDE 0.9 % IV SOLN
8.0000 mg/h | INTRAVENOUS | Status: DC
  Administered 2020-02-17 – 2020-02-18 (×3): 8 mg/h via INTRAVENOUS
  Filled 2020-02-17 (×2): qty 80

## 2020-02-17 MED ORDER — INSULIN GLARGINE 100 UNIT/ML ~~LOC~~ SOLN
20.0000 [IU] | Freq: Every day | SUBCUTANEOUS | Status: DC
  Administered 2020-02-17: 20 [IU] via SUBCUTANEOUS
  Filled 2020-02-17 (×3): qty 0.2

## 2020-02-17 MED ORDER — AMLODIPINE BESYLATE 5 MG PO TABS
5.0000 mg | ORAL_TABLET | Freq: Every day | ORAL | Status: DC
  Administered 2020-02-17: 5 mg via ORAL
  Filled 2020-02-17: qty 1

## 2020-02-17 MED ORDER — IOHEXOL 350 MG/ML SOLN
100.0000 mL | Freq: Once | INTRAVENOUS | Status: AC | PRN
  Administered 2020-02-17: 100 mL via INTRAVENOUS

## 2020-02-17 MED ORDER — GABAPENTIN 300 MG PO CAPS
300.0000 mg | ORAL_CAPSULE | Freq: Every day | ORAL | Status: DC
  Administered 2020-02-17: 300 mg via ORAL
  Filled 2020-02-17: qty 1

## 2020-02-17 NOTE — ED Notes (Signed)
Bedside commode placed at bedside. Pt given wipes placed in reach of bedside commode.

## 2020-02-17 NOTE — ED Provider Notes (Signed)
[facility] Emergency Department Provider Note  ____________________________________________   Event Date/Time   First MD Initiated Contact with Patient 02/17/20 0031     (approximate)  I have reviewed the triage vital signs and the nursing notes.   HISTORY  Chief Complaint Shortness of Breath and Rectal Bleeding    HPI Annette Foster is a 80 y.o. female with medical history as listed below which is notable for CHF on furosemide, A. 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  She was recently admitted to the hospital for A. fib with RVR and started on Eliquis, discharged about 2 weeks ago from the hospital.  She presents tonight for extensive GI bleeding as well as fatigue, shortness of breath, substantial weight gain recently, swelling in the abdomen in the legs, and shortness of breath with exertion.  She reports that she had an episode of a large volume of bright red blood per rectum several days ago but then it stopped the next day.  She was continually fatigued and try to get a lot of rest and was reassured when the bleeding did not recur, but it happened again yesterday and she said "the blood just kept coming out".  At the same time she reports an 18 pound weight gain over the last 1 to 2 weeks and has had swelling in her legs and substantial increase of her abdominal girth.  She has had shortness of breath with exertion which is getting steadily worse.  She has had no cough.  She denies chest pain.  She denies fever/chills, change in smell and taste, and sore throat.  Nothing in particular is making the symptoms better.  At the same time she also noticed that her heart rate is very low.  Her cardiologist, Dr. Saunders Revel, recommended that she stop taking her digoxin and she has not had it for several days but her heart rate remains low.           Past  Medical History:  Diagnosis Date  . Abdominal pain   . Allergy   . Cancer [facility] 2017   breast cancer- Left  . Cataract   . CHF (congestive heart failure) ([place])   . Collagenous colitis   . Diabetes mellitus without complication ([place])   . Diarrhea   . Diverticulosis   . Fatty liver disease, nonalcoholic   . Fibrocystic breast   . GERD (gastroesophageal reflux disease)   . Heart murmur   . Hyperlipidemia   . Hypertension   . Hypothyroidism   . IBS (irritable bowel syndrome)   . IDA (iron deficiency anemia)   . Personal history of radiation therapy 2017   LEFT BREAST CA  . PONV (postoperative nausea and vomiting)   . Sleep apnea    C-Pap    Patient Active Problem List   Diagnosis Date Noted  . Hematochezia 02/17/2020  . Chronic anticoagulation 02/17/2020  . Anasarca 02/17/2020  . Symptomatic sinus bradycardia 02/17/2020  . Unspecified atrial fibrillation ([place]) 02/17/2020  . CHF exacerbation ([place]) 02/17/2020  . AKI (acute kidney injury) ([place]) 02/17/2020  . Atrial fibrillation with RVR ([place]) 02/07/2020  . NASH (nonalcoholic steatohepatitis)   . Hav (hallux abducto valgus), unspecified laterality 06/03/2019  . Acquired bilateral hammer toes 06/03/2019  . Iron deficiency 04/26/2019  . Right-sided chest wall pain 04/12/2019  . Mid back pain on right side 04/12/2019  . 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 Recurrent UTI (urinary tract infection) 07/23/2017  . Nausea vomiting and diarrhea 01/11/2017  . Hypothyroidism 01/11/2017  . HTN (hypertension) 01/11/2017  . Diabetes ([place]) 01/11/2017  . GERD (gastroesophageal reflux disease) 01/11/2017  . Other pancytopenia ([place]) 11/14/2016  . 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 Status post hysterectomy 06/20/2016  . 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 Vaginal atrophy 01/12/2016  . 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 ABDOMINAL HYSTERECTOMY     [place] bso  . ABDOMINAL SURGERY    . APPENDECTOMY    . BREAST BIOPSY Bilateral 2016   negative  . 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 BREAST BIOPSY Left 05/27/2016   BENIGN MAMMARY EPITHELIUM  . BREAST BIOPSY Left 11/20/2017   affirm bx x clip BENIGN MAMMARY EPITHELIUM CONSISTENT WITH RAD THERAPY  . BREAST LUMPECTOMY Left 10/17/2015   DCIS and papillary carcinoma insitu, clear margins  . CARDIAC SURGERY     has replacement valve  . CATARACT EXTRACTION Right   . CHOLECYSTECTOMY    . COLONOSCOPY WITH PROPOFOL N/A 03/11/2016   Procedure: COLONOSCOPY WITH PROPOFOL;  Surgeon: Manya Silvas, MD;  Location: [facility] ENDOSCOPY;  Service: Endoscopy;  Laterality: N/A;  . ESOPHAGOGASTRODUODENOSCOPY (EGD) WITH PROPOFOL N/A 03/11/2016   Procedure: ESOPHAGOGASTRODUODENOSCOPY (EGD) WITH PROPOFOL;  Surgeon: Manya Silvas, MD;  Location: [facility] ENDOSCOPY;  Service: Endoscopy;  Laterality: N/A;  . FINGER ARTHROSCOPY WITH CARPOMETACARPEL ([place]) ARTHROPLASTY Right 09/03/2018   Procedure: CARPOMETACARPEL [facility]) ARTHROPLASTY RIGHT THUMB;  Surgeon: Hessie Knows, MD;  Location: [facility] ORS;  Service: Orthopedics;  Laterality: Right;  . GANGLION CYST EXCISION Right 09/03/2018   Procedure: REMOVAL GANGLION OF WRIST;  Surgeon: Hessie Knows, MD;  Location: [facility] ORS;  Service: Orthopedics;  Laterality: Right;  . HARDWARE REMOVAL Right 09/03/2018   Procedure: HARDWARE REMOVAL RIGHT THUMB;  Surgeon: Hessie Knows, MD;  Location: [facility] ORS;  Service: Orthopedics;  Laterality: Right;  staple removed  . JOINT REPLACEMENT Left    TKR  . left sinusplasty     . MASTECTOMY, PARTIAL Left 10/17/2015   Procedure: MASTECTOMY PARTIAL REVISION;  Surgeon: Leonie Green, MD;  Location: [facility] ORS;  Service: General;  Laterality: Left;  . PARTIAL MASTECTOMY WITH NEEDLE LOCALIZATION Left 09/29/2015   Procedure: PARTIAL  MASTECTOMY WITH NEEDLE LOCALIZATION;  Surgeon: Leonie Green, MD;  Location: [facility] ORS;  Service: General;  Laterality: Left;  . TOTAL ABDOMINAL HYSTERECTOMY W/ BILATERAL SALPINGOOPHORECTOMY      Prior to Admission medications   Medication Sig Start Date End Date Taking? Authorizing Provider  apixaban (ELIQUIS) 5 MG TABS tablet Take 1 tablet (5 mg total) by mouth 2 (two) times daily. 02/08/20   Lorella Nimrod, MD  benzonatate (TESSALON) 200 MG capsule Take 200 mg by mouth 3 (three) times daily as needed for cough.    [provider]  Cholecalciferol (VITAMIN D) 50 MCG (2000 UT) CAPS Take 2,000 Units by mouth daily.    [provider]  clobetasol ointment (TEMOVATE) 4.16 % Apply 1 application topically 2 (two) times a week. 11/18/19   Harlin Heys, MD  cyanocobalamin (,VITAMIN B-12,) 1000 MCG/ML injection  04/23/19   [provider]  dicyclomine (BENTYL) 10 MG capsule Take 10 mg by mouth at bedtime.  05/16/19   [provider]  EPINEPHrine 0.3 mg/0.3  mL IJ SOAJ injection  05/18/19   [provider]  escitalopram (LEXAPRO) 5 MG tablet Take 5 mg by mouth at bedtime.    [provider]  esomeprazole (NEXIUM) 40 MG capsule Take 40 mg by mouth 2 (two) times daily before a meal.  02/08/16   [provider]  estradiol (ESTRACE) 0.1 MG/GM vaginal cream Place 8.93 Applicatorfuls vaginally 2 (two) times a week. 08/19/19 08/18/20  Harlin Heys, MD  fenofibrate 160 MG tablet Take 160 mg by mouth at bedtime.    [provider]  fluticasone (FLONASE) 50 MCG/ACT nasal spray Place 1 spray into the nose daily as needed for allergies.    [provider]  furosemide (LASIX) 20 MG tablet Take 20-40 mg by mouth See admin instructions. Take once a day. alternate taking 36m and 414m    [provider]  gabapentin (NEURONTIN) 300 MG capsule Take 300 mg by mouth at bedtime.  09/07/16 10/25/36  [provider]   glimepiride (AMARYL) 2 MG tablet Take 2 mg by mouth at bedtime.    [provider]  Hyoscyamine Sulfate 0.375 MG TBCR Take 0.375 mg by mouth 2 (two) times daily.     [provider]  insulin aspart (NOVOLOG) 100 UNIT/ML FlexPen Inject 15 Units into the skin 3 (three) times daily with meals.    [provider]  Insulin Degludec (TRESIBA) 100 UNIT/ML SOLN Inject 50 mg into the skin at bedtime.    [provider]  levothyroxine (SYNTHROID, LEVOTHROID) 75 MCG tablet Take 75 mcg by mouth daily before breakfast.     [provider]  magnesium oxide (MAG-OX) 400 MG tablet Take 400 mg by mouth 2 (two) times daily.     [provider]  metFORMIN (GLUCOPHAGE-XR) 750 MG 24 hr tablet Take 750 mg by mouth daily with breakfast.     [provider]  metoprolol tartrate (LOPRESSOR) 25 MG tablet Take 1 tablet (25 mg total) by mouth 2 (two) times daily. 02/08/20   AmLorella NimrodMD  NOVOLOG FLEXPEN 100 UNIT/ML FlexPen Based on sliding scale 08/14/19   [provider]  ondansetron (ZOFRAN) 4 MG tablet Take 4 mg by mouth every 8 (eight) hours as needed for nausea or vomiting. 01/06/17   [provider]  potassium chloride SA (K-DUR,KLOR-CON) 20 MEQ tablet Take 20 mEq by mouth 2 (two) times daily.     [provider]  saccharomyces boulardii (FLORASTOR) 250 MG capsule Take 250 mg by mouth 2 (two) times daily.     [provider]  Semaglutide,0.25 or 0.5MG/DOS, 2 MG/1.5ML SOPN Inject 0.5 mg into the skin once a week. On Sunday. 11/22/19   [provider]  simvastatin (ZOCOR) 20 MG tablet Take 20 mg by mouth at bedtime.    [provider]  valACYclovir (VALTREX) 500 MG tablet Take 500 mg by mouth See admin instructions. For fever blisters take [id number] a day. 01/06/17   [provider]  valsartan (DIOVAN) 160 MG tablet Take 160 mg by mouth every morning.     [provider]     Allergies Codeine, Demeclocycline, Demerol [meperidine], Hydrocodone, Oxycodone, Pentazocine, Tetracyclines & related, and Fentanyl  Family History  Problem Relation Age of Onset  . Breast cancer Paternal Grandmother   . Colon cancer Father   . Diabetes Sister   . Diabetes Brother   . Heart disease Brother   . Prostate cancer Brother   . Colon cancer Maternal Uncle   .  Prostate cancer Brother   . Bladder Cancer Brother   . Leukemia Mother        all  . Ovarian cancer Neg Hx   . Kidney cancer Neg Hx     Social History Social History   Tobacco Use  . Smoking status: Never Smoker  . Smokeless tobacco: Never Used  Vaping Use  . Vaping Use: Never used  Substance Use Topics  . Alcohol use: No    Alcohol/week: 0.0 standard drinks  . Drug use: No    Review of Systems Constitutional: No fever/chills.  General malaise and fatigue. Eyes: No visual changes. ENT: No sore throat. Cardiovascular: Denies chest pain. Respiratory: Shortness of breath with exertion. Gastrointestinal: Bright red blood per rectum over the last couple of days intermittently.  Some aching abdominal pain corresponding with substantial increase in girth of her abdomen.  No nausea, vomiting, nor diarrhea. Genitourinary: Negative for dysuria. Musculoskeletal: Negative for neck pain.  Negative for back pain. Integumentary: Negative for rash. Neurological: Negative for headaches, focal weakness or numbness.   ____________________________________________   PHYSICAL EXAM:  VITAL SIGNS: ED Triage Vitals  Enc Vitals Group     BP 02/16/20 1722 (!) 129/93     Pulse Rate 02/16/20 1723 77     Resp 02/16/20 1722 20     Temp 02/16/20 1722 98.6 F (37 C)     Temp Source 02/16/20 1722 Oral     SpO2 02/16/20 1723 98 %     Weight 02/16/20 1720 81.6 kg (180 lb)     Height 02/16/20 1720 1.6 m (5' 3" )     Head Circumference --      Peak Flow --      Pain Score 02/16/20 1720 0     Pain Loc --      Pain  Edu? --      Excl. in [place]? --     Constitutional: Alert and oriented.  Eyes: Conjunctivae are normal.  Head: Atraumatic. Nose: No congestion/rhinnorhea. Mouth/Throat: Patient is wearing a mask. Neck: No stridor.  No meningeal signs.   Cardiovascular: Normal rate, regular rhythm. Good peripheral circulation. Respiratory: Normal respiratory effort.  No retractions. Gastrointestinal: Firm but generally nontender abdomen, just a little bit of discomfort upon palpation.  Appears substantially distended.  Rectal exam is notable for nonbleeding and nonthrombosed external hemorrhoids and melena on digital rectal exam.  ED chaperone present throughout exam. Musculoskeletal: 1-2+ pitting edema in bilateral lower extremities. No gross deformities of extremities. Neurologic:  Normal speech and language. No gross focal neurologic deficits are appreciated.  Skin:  Skin is warm, dry and intact.   ____________________________________________   LABS (all labs ordered are listed, but only abnormal results are displayed)  Labs Reviewed  BASIC METABOLIC PANEL - Abnormal; Notable for the following components:      Result Value   Glucose, Bld 215 (*)    BUN 32 (*)    Creatinine, Ser 1.18 (*)    Calcium 8.5 (*)    GFR, Estimated 47 (*)    All other components within normal limits  CBC - Abnormal; Notable for the following components:   WBC 2.8 (*)    RBC 3.56 (*)    Hemoglobin 10.8 (*)    HCT 34.3 (*)    Platelets 119 (*)    All other components within normal limits  PROTIME-INR - Abnormal; Notable for the following components:   Prothrombin Time 15.7 (*)    INR 1.3 (*)    All  other components within normal limits  BRAIN NATRIURETIC PEPTIDE - Abnormal; Notable for the following components:   B Natriuretic Peptide 377.8 (*)    All other components within normal limits  LIPASE, BLOOD - Abnormal; Notable for the following components:   Lipase 57 (*)    All other components within normal limits   DIGOXIN LEVEL - Abnormal; Notable for the following components:   Digoxin Level 0.5 (*)    All other components within normal limits  RESP PANEL BY RT-PCR (FLU A&B, COVID) ARPGX2  HEPATIC FUNCTION PANEL  TYPE AND SCREEN  TYPE AND SCREEN  TROPONIN I (HIGH SENSITIVITY)  TROPONIN I (HIGH SENSITIVITY)   ____________________________________________  EKG  ED ECG REPORT I, Hinda Kehr, the attending physician, personally viewed and interpreted this ECG.  Date: 02/16/2020 EKG Time: 17:24 Rate: 48 Rhythm: sinus bradycardia with first-degree AV block QRS Axis: normal Intervals: normal other than first-degree AV block, PR interval is 212 ms ST/T Wave abnormalities: inverted T-waves in II, III, aVF, V5, V6 Narrative Interpretation: no definitive evidence of acute ischemia; does not meet STEMI criteria.   ____________________________________________  RADIOLOGY I, Hinda Kehr, personally viewed and evaluated these images (plain radiographs) as part of my medical decision making, as well as reviewing the written report by the radiologist.  ED MD interpretation: No acute abnormalities identified on chest x-ray  Official radiology report(s): DG Chest 2 View  Result Date: 02/16/2020 CLINICAL DATA:  Shortness of breath EXAM: CHEST - 2 VIEW COMPARISON:  February 07, 2020 FINDINGS: There is interstitial thickening bilaterally with areas of scarring in the left mid lung. No evident consolidation or well-defined airspace opacity. Heart is mildly enlarged with pulmonary vascularity normal. No adenopathy. No bone lesions. IMPRESSION: Scarring left mid lung. Areas of interstitial thickening, likely due to chronic bronchitis. No frank edema or airspace opacity evident. Stable cardiac prominence. Electronically Signed   By: Lowella Grip III M.D.   On: 02/16/2020 18:15   CT Angio Abd/Pel W and/or Wo Contrast  Result Date: 02/17/2020 CLINICAL DATA:  Bright red blood per rectum, dark blood in stool  for 4 days, anticoagulated, history of breast cancer EXAM: CTA ABDOMEN AND PELVIS WITHOUT AND WITH CONTRAST TECHNIQUE: Multidetector CT imaging of the abdomen and pelvis was performed using the standard protocol during bolus administration of intravenous contrast. Multiplanar reconstructed images and MIPs were obtained and reviewed to evaluate the vascular anatomy. CONTRAST:  115m OMNIPAQUE IOHEXOL 350 MG/ML SOLN COMPARISON:  05/20/2017 FINDINGS: VASCULAR Aorta: Normal caliber aorta without aneurysm, dissection, vasculitis or significant stenosis. Celiac: Patent without evidence of aneurysm, dissection, vasculitis or significant stenosis. SMA: Patent without evidence of aneurysm, dissection, vasculitis or significant stenosis. Renals: There are 3 renal arteries seen bilaterally. Vessels are patent without evidence of aneurysm, dissection, or significant stenosis. IMA: Patent without evidence of aneurysm, dissection, vasculitis or significant stenosis. Inflow: Patent without evidence of aneurysm, dissection, vasculitis or significant stenosis. Proximal Outflow: Bilateral common femoral and visualized portions of the superficial and profunda femoral arteries are patent without evidence of aneurysm, dissection, vasculitis or significant stenosis. Veins: No obvious venous abnormality within the limitations of this arterial phase study. Review of the MIP images confirms the above findings. NON-VASCULAR Lower chest: Right pleural effusion volume estimated less than 1 L. Hypoventilatory changes at the lung bases. Hepatobiliary: Mild nodularity of the liver capsule suggests cirrhosis. No focal liver abnormalities. The gallbladder is surgically absent. Pancreas: Unremarkable. No pancreatic ductal dilatation or surrounding inflammatory changes. Spleen: Spleen is enlarged measuring 16.2 cm  in craniocaudal length. No focal abnormalities. Adrenals/Urinary Tract: Adrenal glands are unremarkable. Kidneys are normal, without renal  calculi, focal lesion, or hydronephrosis. Bladder is unremarkable. Stomach/Bowel: No bowel obstruction or ileus. No bowel wall thickening or inflammatory change. Scattered diverticulosis of the colon without diverticulitis. There is no accumulation of contrast within the bowel lumen to suggest active gastrointestinal hemorrhage. Lymphatic: Multiple borderline enlarged retroperitoneal lymph nodes are identified, largest measuring 12 mm in the left para-aortic region. These are nonspecific. Reproductive: Status post hysterectomy. No adnexal masses. Other: Small volume ascites. No free intraperitoneal gas. No abdominal wall hernia. Musculoskeletal: No acute or destructive bony lesions. Reconstructed images demonstrate no additional findings. IMPRESSION: 1. No evidence of active gastrointestinal bleeding. 2. Right pleural effusion, volume estimated less than 1 L. 3. 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 4. Diverticulosis without diverticulitis. 5. Borderline enlarged retroperitoneal lymph nodes, likely reactive. 6.  Aortic Atherosclerosis (ICD10-I70.0). Electronically Signed   By: Randa Ngo M.D.   On: 02/17/2020 03:23    ____________________________________________   PROCEDURES   Procedure(s) performed (including Critical Care):  .1-3 Lead EKG Interpretation Performed by: Hinda Kehr, MD Authorized by: Hinda Kehr, MD     Interpretation: abnormal     ECG rate:  45   ECG rate assessment: bradycardic     Rhythm: sinus bradycardia     Ectopy: none     Conduction: normal   .Critical Care Performed by: Hinda Kehr, MD Authorized by: Hinda Kehr, MD   Critical care provider statement:    Critical care time (minutes):  30   Critical care time was exclusive of:  Separately billable procedures and treating other patients   Critical care was necessary to treat or prevent imminent or life-threatening deterioration of the following conditions:   Circulatory failure and cardiac failure   Critical care was time spent personally by me on the following activities:  Development of treatment plan with patient or surrogate, discussions with consultants, evaluation of patient's response to treatment, examination of patient, obtaining history from patient or surrogate, ordering and performing treatments and interventions, ordering and review of laboratory studies, ordering and review of radiographic studies, pulse oximetry, re-evaluation of patient's condition and review of old charts     ____________________________________________   [place] / MDM / [place] / ED COURSE  As part of my medical decision making, I reviewed the following data within the [place] notes reviewed and incorporated, Labs reviewed , EKG interpreted , Old chart reviewed, Radiograph reviewed , Discussed with admitting physician  and Notes from prior ED visits   Differential diagnosis includes, but is not limited to, symptomatic bradycardia, accidental digoxin overdose, diverticular bleed, neoplasm, AV malformation, bleeding as result of anticoagulation, symptomatic anemia either due to her chronic iron deficiency or acute blood loss.  The patient is on the cardiac monitor to evaluate for evidence of arrhythmia and/or significant heart rate changes.  Patient has a normal blood pressure in spite of a heart rate that goes down as low as 30 but tends to stay in the low 40s.  EKG shows sinus bradycardia with first-degree AV block.  High-sensitivity troponin is only 13 which is reassuring.  Patient is on Eliquis and her coagulation studies were slightly elevated.  Leukopenia of 2.8, hemoglobin is relatively stable at 10.8 for now although her blood loss has been acute.  She has melena on rectal exam.  Her abdomen and her lower extremities are substantially increased in girth  and she reports an 18 pound weight gain.  I suspect at a  minimum that she is having a CHF exacerbation but is also possible that she is having intra-abdominal bleeding although she is minimally tender.  Basic metabolic panel is essentially normal other than a slight elevation in her creatinine.  BNP is elevated.  I personally reviewed the patient's imaging and agree with the radiologist's interpretation that there is no obvious gross emergent abnormality, but she could be having some interstitial edema.  Given the constellation of symptoms, most concerning the GI bleeding on Eliquis, anticipate admission.  I ordered pantoprazole 80 mg IV bolus followed by 8 mg/h IV infusion.  Patient is to remain n.p.o.  Given her increased abdominal girth, aching abdominal pain, weight gain, and GI bleeding, I ordered a CTA abdomen/pelvis (GI bleeding protocol) to try to identify a possible source of GI bleed.  Holding Eliquis for now.  I ordered a digoxin level and a type and screen.  Patient understands the need to stay in the hospital.  No indication for emergent consultation of GI or cardiology at this time.     Clinical Course as of 02/17/20 0420  Thu Feb 17, 2020  0221 Lipase(!): 57 mild, non-specific lipase elevation [CF]  0221 SARS Coronavirus 2 by RT PCR: NEGATIVE [CF]  0254 Digoxin, Serum(!): 0.5 [CF]  0327 CT Angio Abd/Pel W and/or Wo Contrast No acute abnormalities identified on CTA abdomen/pelvis.  I will consult the hospitalist for admission. [CF]  0327 I am also going to begin diuresis with furosemide 20 mg IV. [CF]  0415 Discussed case with the hospitalist who will admit [CF]    Clinical Course User Index [CF] Hinda Kehr, MD     ____________________________________________  FINAL CLINICAL IMPRESSION(S) / ED DIAGNOSES  Final diagnoses:  Acute GI bleeding  Acute on chronic congestive heart failure, unspecified heart failure type ([place])  Anasarca  Symptomatic bradycardia     MEDICATIONS GIVEN DURING THIS VISIT:  Medications  pantoprazole  (PROTONIX) 80 mg in sodium chloride 0.9 % 100 mL (0.8 mg/mL) infusion (8 mg/hr Intravenous New Bag/Given 02/17/20 0153)  pantoprazole (PROTONIX) 80 mg in sodium chloride 0.9 % 100 mL IVPB (0 mg Intravenous Stopped 02/17/20 0326)  iohexol (OMNIPAQUE) 350 MG/ML injection 100 mL (100 mLs Intravenous Contrast Given 02/17/20 0255)  furosemide (LASIX) injection 20 mg (20 mg Intravenous Given 02/17/20 0358)     ED Discharge Orders    None      *Please note:  Annette Foster was evaluated in Emergency Department on 02/17/2020 for the symptoms described in the history of present illness. She was evaluated in the context of the global COVID-19 pandemic, which necessitated consideration that the patient might be at risk for infection with the SARS-CoV-2 virus that causes COVID-19. Institutional protocols and algorithms that pertain to the evaluation of patients at risk for COVID-19 are in a state of rapid change based on information released by regulatory bodies including the CDC and federal and state organizations. These policies and algorithms were followed during the patient's care in the ED.  Some ED evaluations and interventions may be delayed as a result of limited staffing during and after the pandemic.*  Note:  This document was prepared using Dragon voice recognition software and may include unintentional dictation errors.   Hinda Kehr, MD 02/17/20 [phone]

## 2020-02-17 NOTE — ED Notes (Signed)
Pt calling for food, given crackers and soda. 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

## 2020-02-17 NOTE — ED Notes (Signed)
Pt given menu

## 2020-02-17 NOTE — H&P (Signed)
History and Physical    Annette Foster TIW:[id number] DOB: 08-Mar-1940 DOA: 02/17/2020  PCP: Jaclyn Shaggy, MD   Patient coming from: Home I have personally briefly reviewed patient's old medical records in [facility]  Chief Complaint: Bright red blood per rectum, fatigue, abdominal distention   HPI: Annette Foster is a 80 y.o. female with medical history significant for collagenous colitis with intermittent GI bleeding and iron deficiency anemia requiring iron transfusions, seizure disorder, OSA on CPAP, NASH, HTN, left-sided breast cancer status postmastectomy, pancytopenia, new onset A. fib during hospitalization from 12/27-12/28 when she was started on Eliquis and digoxin, who presents to the emergency room with increasing episodes of bright red blood per rectum associated with fatigue and generalized weakness.  She also noted worsening of her chronic abdominal distention and lower extremity edema.  She denies chest pain.  Denies cough, fever or chills or shortness of breath beyond her baseline.  She reports a history of cardiac surgery and CHF however echocardiogram done on 12/28 showed normal EF 50 to 55% and indeterminate for any diastolic dysfunction. ED Course: On arrival to the ER vitals were within normal limits.  Hemoglobin at baseline of 10.7, pancytopenia at baseline, creatinine slightly elevated at 1.18 above baseline of 0.79, BNP elevated at 377.8, lipase 57, digoxin level 0.5. 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    Past Medical History:  Diagnosis Date  . Abdominal pain   . Allergy   . Cancer [facility]) 2017   breast cancer- Left  . Cataract   . CHF (congestive heart failure) (HCC)    . Collagenous colitis   . Diabetes mellitus without complication (HCC)   . Diarrhea   . Diverticulosis   . Fatty liver disease, nonalcoholic   . Fibrocystic breast   . GERD (gastroesophageal reflux disease)   . Heart murmur   . Hyperlipidemia   . Hypertension   . Hypothyroidism   . IBS (irritable bowel syndrome)   . IDA (iron deficiency anemia)   . Personal history of radiation therapy 2017   LEFT BREAST CA  . PONV (postoperative nausea and vomiting)   . Sleep apnea    C-Pap    Past Surgical History:  Procedure Laterality Date  . ABDOMINAL HYSTERECTOMY     tah bso  . ABDOMINAL SURGERY    . APPENDECTOMY    . BREAST BIOPSY Bilateral 2016   negative  . 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 BREAST BIOPSY Left 05/27/2016   BENIGN MAMMARY EPITHELIUM  . BREAST BIOPSY Left 11/20/2017   affirm bx x clip BENIGN MAMMARY EPITHELIUM CONSISTENT WITH RAD THERAPY  . BREAST LUMPECTOMY Left 10/17/2015   DCIS and papillary carcinoma insitu, clear margins  . CARDIAC SURGERY     has replacement valve  . CATARACT EXTRACTION Right   . CHOLECYSTECTOMY    . COLONOSCOPY WITH PROPOFOL N/A 03/11/2016   Procedure: COLONOSCOPY WITH PROPOFOL;  Surgeon: Scot Jun, MD;  Location: [facility] ENDOSCOPY;  Service: Endoscopy;  Laterality: N/A;  . ESOPHAGOGASTRODUODENOSCOPY (EGD) WITH PROPOFOL N/A 03/11/2016   Procedure: ESOPHAGOGASTRODUODENOSCOPY (EGD) WITH PROPOFOL;  Surgeon: Scot Jun, MD;  Location: [facility] ENDOSCOPY;  Service: Endoscopy;  Laterality: N/A;  .  FINGER ARTHROSCOPY WITH CARPOMETACARPEL [facility]) ARTHROPLASTY Right 09/03/2018   Procedure: CARPOMETACARPEL [facility]) ARTHROPLASTY RIGHT THUMB;  Surgeon: Hessie Knows, MD;  Location: [facility] ORS;  Service: Orthopedics;  Laterality: Right;  . GANGLION CYST EXCISION Right 09/03/2018   Procedure: REMOVAL GANGLION OF WRIST;  Surgeon: Hessie Knows, MD;  Location: [facility] ORS;  Service: Orthopedics;  Laterality: Right;  . HARDWARE  REMOVAL Right 09/03/2018   Procedure: HARDWARE REMOVAL RIGHT THUMB;  Surgeon: Hessie Knows, MD;  Location: [facility] ORS;  Service: Orthopedics;  Laterality: Right;  staple removed  . JOINT REPLACEMENT Left    TKR  . left sinusplasty     . MASTECTOMY, PARTIAL Left 10/17/2015   Procedure: MASTECTOMY PARTIAL REVISION;  Surgeon: Leonie Green, MD;  Location: [facility] ORS;  Service: General;  Laterality: Left;  . PARTIAL MASTECTOMY WITH NEEDLE LOCALIZATION Left 09/29/2015   Procedure: PARTIAL MASTECTOMY WITH NEEDLE LOCALIZATION;  Surgeon: Leonie Green, MD;  Location: [facility] ORS;  Service: General;  Laterality: Left;  . TOTAL ABDOMINAL HYSTERECTOMY W/ BILATERAL SALPINGOOPHORECTOMY       reports that she has never smoked. She has never used smokeless tobacco. She reports that she does not drink alcohol and does not use drugs.  Allergies  Allergen Reactions  . Codeine Itching and Nausea And Vomiting  . Demeclocycline Rash  . Demerol [Meperidine] Itching and Nausea And Vomiting  . Hydrocodone Itching and Nausea And Vomiting  . Oxycodone Itching and Nausea And Vomiting  . Pentazocine Itching and Nausea And Vomiting  . Tetracyclines & Related Rash  . Fentanyl Itching and Nausea And Vomiting    D/W surgical nurse on 7/23 and pt reportedly tolerated dose during surgery.  Dose was given by MD.  Pt reported that she only gets itching and did not object to receiving med    Family History  Problem Relation Age of Onset  . Breast cancer Paternal Grandmother   . Colon cancer Father   . Diabetes Sister   . Diabetes Brother   . Heart disease Brother   . Prostate cancer Brother   . Colon cancer Maternal Uncle   . Prostate cancer Brother   . Bladder Cancer Brother   . Leukemia Mother        all  . Ovarian cancer Neg Hx   . Kidney cancer Neg Hx       Prior to Admission medications   Medication Sig Start Date End Date Taking? Authorizing Provider  apixaban (ELIQUIS) 5 MG TABS tablet Take 1  tablet (5 mg total) by mouth 2 (two) times daily. 02/08/20   Lorella Nimrod, MD  benzonatate (TESSALON) 200 MG capsule Take 200 mg by mouth 3 (three) times daily as needed for cough.    [provider]  Cholecalciferol (VITAMIN D) 50 MCG (2000 UT) CAPS Take 2,000 Units by mouth daily.    [provider]  clobetasol ointment (TEMOVATE) [id number] % Apply 1 application topically 2 (two) times a week. 11/18/19   Harlin Heys, MD  cyanocobalamin (,VITAMIN B-12,) 1000 MCG/ML injection  04/23/19   [provider]  dicyclomine (BENTYL) 10 MG capsule Take 10 mg by mouth at bedtime.  05/16/19   [provider]  EPINEPHrine 0.3 mg/0.3 mL IJ SOAJ injection  05/18/19   [provider]  escitalopram (LEXAPRO) 5 MG tablet Take 5 mg by mouth at bedtime.    [provider]  esomeprazole (NEXIUM) 40 MG capsule Take 40 mg by mouth 2 (two) times daily before a meal.  02/08/16   [provider]  estradiol (ESTRACE) 0.1 MG/GM vaginal cream Place [id number] Applicatorfuls vaginally 2 (two) times a week. 08/19/19 08/18/20  Harlin Heys, MD  fenofibrate 160 MG tablet Take 160 mg by mouth at bedtime.    [provider]  fluticasone (FLONASE) 50 MCG/ACT nasal spray Place 1 spray into the nose daily as needed for allergies.    [provider]  furosemide (LASIX) 20 MG tablet Take 20-40 mg by mouth See admin instructions. Take once a day. alternate taking 20mg  and 40mg .    [provider]  gabapentin (NEURONTIN) 300 MG capsule Take 300 mg by mouth at bedtime.  09/07/16 10/25/36  [provider]  glimepiride (AMARYL) 2 MG tablet Take 2 mg by mouth at bedtime.    [provider]  Hyoscyamine Sulfate 0.375 MG TBCR Take 0.375 mg by mouth 2 (two) times daily.     [provider]  insulin aspart (NOVOLOG) 100 UNIT/ML FlexPen Inject 15 Units into the skin 3 (three) times daily with meals.    [provider]  Insulin  Degludec (TRESIBA) 100 UNIT/ML SOLN Inject 50 mg into the skin at bedtime.    [provider]  levothyroxine (SYNTHROID, LEVOTHROID) 75 MCG tablet Take 75 mcg by mouth daily before breakfast.     [provider]  magnesium oxide (MAG-OX) 400 MG tablet Take 400 mg by mouth 2 (two) times daily.     [provider]  metFORMIN (GLUCOPHAGE-XR) 750 MG 24 hr tablet Take 750 mg by mouth daily with breakfast.     [provider]  metoprolol tartrate (LOPRESSOR) 25 MG tablet Take 1 tablet (25 mg total) by mouth 2 (two) times daily. 02/08/20   Lorella Nimrod, MD  NOVOLOG FLEXPEN 100 UNIT/ML FlexPen Based on sliding scale 08/14/19   [provider]  ondansetron (ZOFRAN) 4 MG tablet Take 4 mg by mouth every 8 (eight) hours as needed for nausea or vomiting. 01/06/17   [provider]  potassium chloride SA (K-DUR,KLOR-CON) 20 MEQ tablet Take 20 mEq by mouth 2 (two) times daily.     [provider]  saccharomyces boulardii (FLORASTOR) 250 MG capsule Take 250 mg by mouth 2 (two) times daily.     [provider]  Semaglutide,0.25 or 0.5MG /DOS, 2 MG/1.5ML SOPN Inject 0.5 mg into the skin once a week. On Sunday. 11/22/19   [provider]  simvastatin (ZOCOR) 20 MG tablet Take 20 mg by mouth at bedtime.    [provider]  valACYclovir (VALTREX) 500 MG tablet Take 500 mg by mouth See admin instructions. For fever blisters take 500mg  twice a day. 01/06/17   [provider]  valsartan (DIOVAN) 160 MG tablet Take 160 mg by mouth every morning.     [provider]    Physical Exam: Vitals:   02/17/20 0115 02/17/20 0130 02/17/20 0145 02/17/20 0325  BP:    (!) 167/66  Pulse: (!) 50 (!) 47 (!) 46 (!) 55  Resp: 20 20 20 20   Temp:      TempSrc:      SpO2: 97% 98% 98% 98%  Weight:      Height:         Vitals:   02/17/20 0115 02/17/20 0130 02/17/20 0145 02/17/20 0325  BP:    (!) 167/66  Pulse: (!) 50 (!) 47 (!)  46 (!) 55  Resp: 20 20 20 20   Temp:      TempSrc:  SpO2: 97% 98% 98% 98%  Weight:      Height:          Constitutional: Frail appearing but alert and oriented x 3 . Not in any apparent distress HEENT:      Head: Normocephalic and atraumatic.         Eyes: PERLA, EOMI, Conjunctivae are normal. Sclera is non-icteric.       Mouth/Throat: Mucous membranes are moist.       Neck: Supple with no signs of meningismus. Cardiovascular:  Bradycardic. No murmurs, gallops, or rubs. 2+ symmetrical distal pulses are present . No JVD. No 2+LE edema Respiratory: Respiratory effort normal .Lungs sounds diminished bilaterally. No wheezes, crackles, or rhonchi.  Gastrointestinal:  Distended, tight, tender positive fluid thrill genitourinary: No CVA tenderness. Musculoskeletal: Nontender with normal range of motion in all extremities. No cyanosis, or erythema of extremities. Neurologic:  Face is symmetric. Moving all extremities. No gross focal neurologic deficits . Skin: Skin is warm, dry.  No rash or ulcers Psychiatric: Mood and affect are normal    Labs on Admission: I have personally reviewed following labs and imaging studies  CBC: Recent Labs  Lab 02/16/20 1724  WBC 2.8*  HGB 10.8*  HCT 34.3*  MCV 96.3  PLT 123456*   Basic Metabolic Panel: Recent Labs  Lab 02/11/20 1338 02/16/20 1724  NA 131* 137  K 4.9 4.1  CL 99 101  CO2 25 27  GLUCOSE 414* 215*  BUN 18 32*  CREATININE 0.82 1.18*  CALCIUM 8.9 8.5*   GFR: Estimated Creatinine Clearance: 39.1 mL/min (A) (by C-G formula based on SCr of 1.18 mg/dL (H)). Liver Function Tests: Recent Labs  Lab 02/17/20 0127  AST 25  ALT 18  ALKPHOS 38  BILITOT 0.8  PROT 7.7  ALBUMIN 3.7   Recent Labs  Lab 02/17/20 0127  LIPASE 57*   No results for input(s): AMMONIA in the last 168 hours. Coagulation Profile: Recent Labs  Lab 02/16/20 1724  INR 1.3*   Cardiac Enzymes: No results for input(s): CKTOTAL, CKMB, CKMBINDEX,  TROPONINI in the last 168 hours. BNP (last 3 results) No results for input(s): PROBNP in the last 8760 hours. HbA1C: No results for input(s): HGBA1C in the last 72 hours. CBG: No results for input(s): GLUCAP in the last 168 hours. Lipid Profile: No results for input(s): CHOL, HDL, LDLCALC, TRIG, CHOLHDL, LDLDIRECT in the last 72 hours. Thyroid Function Tests: No results for input(s): TSH, T4TOTAL, FREET4, T3FREE, THYROIDAB in the last 72 hours. Anemia Panel: No results for input(s): VITAMINB12, FOLATE, FERRITIN, TIBC, IRON, RETICCTPCT in the last 72 hours. Urine analysis:    Component Value Date/Time   COLORURINE YELLOW (A) 06/15/2019 1039   APPEARANCEUR CLEAR (A) 06/15/2019 1039   APPEARANCEUR Cloudy (A) 07/08/2017 1103   LABSPEC 1.014 06/15/2019 1039   PHURINE 6.0 06/15/2019 1039   GLUCOSEU Large (3+) (A) 08/18/2019 1433   GLUCOSEU NEGATIVE 06/15/2019 1039   HGBUR NEGATIVE 06/15/2019 1039   BILIRUBINUR neg 09/30/2019 0911   BILIRUBINUR Negative 07/08/2017 1103   KETONESUR NEGATIVE 06/15/2019 1039   PROTEINUR Negative 09/30/2019 0911   PROTEINUR NEGATIVE 06/15/2019 1039   UROBILINOGEN 0.2 09/30/2019 0911   NITRITE neg 09/30/2019 0911   NITRITE NEGATIVE 06/15/2019 1039   LEUKOCYTESUR Negative 09/30/2019 0911   LEUKOCYTESUR TRACE (A) 06/15/2019 1039    Radiological Exams on Admission: DG Chest 2 View  Result Date: 02/16/2020 CLINICAL DATA:  Shortness of breath EXAM: CHEST - 2 VIEW COMPARISON:  February 07, 2020 FINDINGS: There is interstitial thickening bilaterally with areas of scarring in the left mid lung. No evident consolidation or well-defined airspace opacity. Heart is mildly enlarged with pulmonary vascularity normal. No adenopathy. No bone lesions. IMPRESSION: Scarring left mid lung. Areas of interstitial thickening, likely due to chronic bronchitis. No frank edema or airspace opacity evident. Stable cardiac prominence. Electronically Signed   By: Bretta Bang III  M.D.   On: 02/16/2020 18:15   CT Angio Abd/Pel W and/or Wo Contrast  Result Date: 02/17/2020 CLINICAL DATA:  Bright red blood per rectum, dark blood in stool for 4 days, anticoagulated, history of breast cancer EXAM: CTA ABDOMEN AND PELVIS WITHOUT AND WITH CONTRAST TECHNIQUE: Multidetector CT imaging of the abdomen and pelvis was performed using the standard protocol during bolus administration of intravenous contrast. Multiplanar reconstructed images and MIPs were obtained and reviewed to evaluate the vascular anatomy. CONTRAST:  OMNIPAQUE IOHEXOL 350 MG/ML SOLN COMPARISON:  05/20/2017 FINDINGS: VASCULAR Aorta: Normal caliber aorta without aneurysm, dissection, vasculitis or significant stenosis. Celiac: Patent without evidence of aneurysm, dissection, vasculitis or significant stenosis. SMA: Patent without evidence of aneurysm, dissection, vasculitis or significant stenosis. Renals: There are 3 renal arteries seen bilaterally. Vessels are patent without evidence of aneurysm, dissection, or significant stenosis. IMA: Patent without evidence of aneurysm, dissection, vasculitis or significant stenosis. Inflow: Patent without evidence of aneurysm, dissection, vasculitis or significant stenosis. Proximal Outflow: Bilateral common femoral and visualized portions of the superficial and profunda femoral arteries are patent without evidence of aneurysm, dissection, vasculitis or significant stenosis. Veins: No obvious venous abnormality within the limitations of this arterial phase study. Review of the MIP images confirms the above findings. NON-VASCULAR Lower chest: Right pleural effusion volume estimated less than 1 L. Hypoventilatory changes at the lung bases. Hepatobiliary: Mild nodularity of the liver capsule suggests cirrhosis. No focal liver abnormalities. The gallbladder is surgically absent. Pancreas: Unremarkable. No pancreatic ductal dilatation or surrounding inflammatory changes. Spleen: Spleen is  enlarged measuring 16.2 cm in craniocaudal length. No focal abnormalities. Adrenals/Urinary Tract: Adrenal glands are unremarkable. Kidneys are normal, without renal calculi, focal lesion, or hydronephrosis. Bladder is unremarkable. Stomach/Bowel: No bowel obstruction or ileus. No bowel wall thickening or inflammatory change. Scattered diverticulosis of the colon without diverticulitis. There is no accumulation of contrast within the bowel lumen to suggest active gastrointestinal hemorrhage. Lymphatic: Multiple borderline enlarged retroperitoneal lymph nodes are identified, largest measuring 12 mm in the left para-aortic region. These are nonspecific. Reproductive: Status post hysterectomy. No adnexal masses. Other: Small volume ascites. No free intraperitoneal gas. No abdominal wall hernia. Musculoskeletal: No acute or destructive bony lesions. Reconstructed images demonstrate no additional findings. IMPRESSION: 1. No evidence of active gastrointestinal bleeding. 2. Right pleural effusion, volume estimated less than 1 L. 3. 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 4. Diverticulosis without diverticulitis. 5. Borderline enlarged retroperitoneal lymph nodes, likely reactive. 6.  Aortic Atherosclerosis (ICD10-I70.0). Electronically Signed   By: Sharlet Salina M.D.   On: 02/17/2020 03:23     Assessment/Plan 80 year old female with history of collagenous colitis with intermittent GI bleeding and iron deficiency anemia requiring iron transfusions, seizure disorder, OSA on CPAP, NASH, HTN, left-sided breast cancer status postmastectomy, pancytopenia, new onset A. fib during hospitalization from 12/27-12/28 when she was started on Eliquis and digoxin, who presents to the emergency room with increasing episodes of bright red blood per rectum associated with fatigue and generalized weakness.  She also noted worsening of her chronic abdominal distention and lower  extremity  edema    Hematochezia, with history of colitis and GI bleed   Chronic anticoagulation with Eliquis - Hemoglobin at baseline.  Stool for occult blood positive - Patient recently started on Eliquis on 12/27 for new onset A. fib - DC Eliquis.  SCDs for DVT prophylaxis - Serial H&H and transfuse if necessary - GI consult    Anasarca, Cirrhosis with ascites, with history of NASH - CT abdomen and pelvis showing cirrhosis with portal hypertension - LFTs WNL - Continue Lasix but with monitoring of renal function - GI consult to optimize meds.  Not currently on spironolactone or rifaximin - Consider IR paracentesis    Symptomatic sinus bradycardia - Sinus bradycardia on EKG - Hold rate control agents, digoxin and metoprolol  Pancytopenia - Possibly in part related to cirrhosis - At baseline - Continue to monitor - Can consider hematology consult    Unspecified atrial fibrillation ([place]) - Patient in sinus rhythm - Holding both Eliquis and digoxin as well as metoprolol    Hypothyroidism - Continue levothyroxine    HTN (hypertension) - Continue home antihypertensives    Diabetes type II ([place]) - Sliding scale insulin coverage    AKI (acute kidney injury) ([place]) - Creatinine slightly elevated at 1.18 above baseline of 0.79 - Continue to monitor in view of ongoing Lasix treatment    DVT prophylaxis: SCDs Code Status: full code  Family Communication:  none  Disposition Plan: Back to previous home environment Consults called: GI Status:At the time of admission, it appears that the appropriate admission status for this patient is INPATIENT. This is judged to be reasonable and necessary in order to provide the required intensity of service to ensure the patient's safety given the presenting symptoms, physical exam findings, and initial radiographic and laboratory data in the context of their  Comorbid conditions.   Patient requires inpatient status due to high intensity of service,  high risk for further deterioration and high frequency of surveillance required.   I certify that at the point of admission it is my clinical judgment that the patient will require inpatient hospital care spanning beyond [place] MD [facility]     02/17/2020, 4:29 AM

## 2020-02-17 NOTE — Consult Note (Signed)
Annette Lame, MD [facility]  [address], [place], [place] [address] Phone: [phone] Fax : [phone]  Consultation  Referring Provider:     Dr. Damita Dunnings Primary Care Physician:  Albina Billet, MD Primary Gastroenterologist: [facility]         Reason for Consultation:     GI bleed  Date of Admission:  02/17/2020 Date of Consultation:  02/17/2020         HPI:   Annette Foster is a 80 y.o. female who reports having EGD and colonoscopy approximate 3 years ago at an outside center by Dr. Vira Agar.  The records from this interaction is not readily available on Care Everywhere.  The patient reports that about 15 years ago she had a similar episode of what she described as a spontaneous GI bleed.  At that time the patient underwent an EGD and colonoscopy but states they did not find any cause for her bleeding.  The patient was recently put on Eliquis for A. fib and stopped it 3 days ago because she started to notice bright red blood per rectum.  She states that the blood was initially bright red and then started to become dark and she even states that it was black like tar.  The patient does report that she has a history of collagenous colitis.  She has been followed by [facility] for many years.  She states that she has also had some abdominal discomfort and bloating since the bleeding started 5 days ago.  There is no report of any change in bowel habits nausea vomiting fevers or chills.  Past Medical History:  Diagnosis Date  . Abdominal pain   . Allergy   . Cancer [facility]) 2017   breast cancer- Left  . Cataract   . CHF (congestive heart failure) ([place])   . Collagenous colitis   . Diabetes mellitus without complication ([place])   . Diarrhea   . Diverticulosis   . Fatty liver disease, nonalcoholic   . Fibrocystic breast   . GERD (gastroesophageal reflux disease)   . Heart murmur   . Hyperlipidemia   . Hypertension   . Hypothyroidism   . IBS (irritable bowel syndrome)   . IDA (iron  deficiency anemia)   . Personal history of radiation therapy 2017   LEFT BREAST CA  . PONV (postoperative nausea and vomiting)   . Sleep apnea    C-Pap    Past Surgical History:  Procedure Laterality Date  . ABDOMINAL HYSTERECTOMY     [facility] bso  . ABDOMINAL SURGERY    . APPENDECTOMY    . BREAST BIOPSY Bilateral 2016   negative  . 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 BREAST BIOPSY Left 05/27/2016   BENIGN MAMMARY EPITHELIUM  . BREAST BIOPSY Left 11/20/2017   affirm bx x clip BENIGN MAMMARY EPITHELIUM CONSISTENT WITH RAD THERAPY  . BREAST LUMPECTOMY Left 10/17/2015   DCIS and papillary carcinoma insitu, clear margins  . CARDIAC SURGERY     has replacement valve  . CATARACT EXTRACTION Right   . CHOLECYSTECTOMY    . COLONOSCOPY WITH PROPOFOL N/A 03/11/2016   Procedure: COLONOSCOPY WITH PROPOFOL;  Surgeon: Manya Silvas, MD;  Location: [facility] ENDOSCOPY;  Service: Endoscopy;  Laterality: N/A;  . ESOPHAGOGASTRODUODENOSCOPY (EGD) WITH PROPOFOL N/A 03/11/2016   Procedure: ESOPHAGOGASTRODUODENOSCOPY (EGD) WITH PROPOFOL;  Surgeon: Manya Silvas, MD;  Location: [facility] ENDOSCOPY;  Service: Endoscopy;  Laterality: N/A;  . FINGER  ARTHROSCOPY WITH CARPOMETACARPEL [facility]) ARTHROPLASTY Right 09/03/2018   Procedure: CARPOMETACARPEL [facility]) ARTHROPLASTY RIGHT THUMB;  Surgeon: Hessie Knows, MD;  Location: [facility] ORS;  Service: Orthopedics;  Laterality: Right;  . GANGLION CYST EXCISION Right 09/03/2018   Procedure: REMOVAL GANGLION OF WRIST;  Surgeon: Hessie Knows, MD;  Location: [facility] ORS;  Service: Orthopedics;  Laterality: Right;  . HARDWARE REMOVAL Right 09/03/2018   Procedure: HARDWARE REMOVAL RIGHT THUMB;  Surgeon: Hessie Knows, MD;  Location: [facility] ORS;  Service: Orthopedics;  Laterality: Right;  staple removed  . JOINT REPLACEMENT Left    TKR  . left sinusplasty     . MASTECTOMY, PARTIAL Left 10/17/2015   Procedure: MASTECTOMY PARTIAL REVISION;  Surgeon: Leonie Green, MD;  Location: [facility] ORS;  Service: General;  Laterality: Left;  . PARTIAL MASTECTOMY WITH NEEDLE LOCALIZATION Left 09/29/2015   Procedure: PARTIAL MASTECTOMY WITH NEEDLE LOCALIZATION;  Surgeon: Leonie Green, MD;  Location: [facility] ORS;  Service: General;  Laterality: Left;  . TOTAL ABDOMINAL HYSTERECTOMY W/ BILATERAL SALPINGOOPHORECTOMY      Prior to Admission medications   Medication Sig Start Date End Date Taking? Authorizing Provider  benzonatate (TESSALON) 200 MG capsule Take 200 mg by mouth 3 (three) times daily as needed for cough.   Yes [provider]  Cholecalciferol (VITAMIN D) 50 MCG (2000 UT) CAPS Take 2,000 Units by mouth daily.   Yes [provider]  clobetasol ointment (TEMOVATE) 6.44 % Apply 1 application topically 2 (two) times a week. 11/18/19  Yes Harlin Heys, MD  cyanocobalamin (,VITAMIN B-12,) 1000 MCG/ML injection 1,000 mcg every 30 (thirty) days. 04/23/19  Yes [provider]  docusate sodium (COLACE) 100 MG capsule Take 1 capsule by mouth 2 (two) times daily as needed. 01/10/20  Yes [provider]  estradiol (ESTRACE) 0.1 MG/GM vaginal cream Place 0.34 Applicatorfuls vaginally 2 (two) times a week. 08/19/19 08/18/20 Yes Harlin Heys, MD  fenofibrate 160 MG tablet Take 160 mg by mouth at bedtime.   Yes [provider]  fluticasone (FLONASE) 50 MCG/ACT nasal spray Place 1 spray into the nose daily as needed for allergies.   Yes [provider]  furosemide (LASIX) 20 MG tablet Take 20-40 mg by mouth See admin instructions. Take once a day. alternate taking 64m and 458m   Yes [provider]  gabapentin (NEURONTIN) 300 MG capsule Take 300 mg by mouth at bedtime.  09/07/16 10/25/36 Yes [provider]  glimepiride (AMARYL) 2 MG tablet Take 2 mg by mouth at bedtime.   Yes [provider]  Hyoscyamine Sulfate 0.375 MG TBCR Take 0.375 mg by mouth 2 (two) times daily.    Yes [provider]  insulin aspart (NOVOLOG) 100 UNIT/ML FlexPen Inject 15 Units into the skin 3 (three) times daily with meals.   Yes [provider]  Insulin Degludec (TRESIBA) 100 UNIT/ML SOLN Inject 50 mg into the skin at bedtime.   Yes [provider]  levothyroxine (SYNTHROID, LEVOTHROID) 75 MCG tablet Take 75 mcg by mouth daily before breakfast.    Yes [provider]  magnesium oxide (MAG-OX) 400 MG tablet Take 400 mg by mouth 2 (two) times daily.    Yes [provider]  metFORMIN (GLUCOPHAGE-XR) 750 MG 24 hr tablet Take 750 mg by mouth daily with breakfast.    Yes [provider]  ondansetron (ZOFRAN) 4 MG tablet Take 4 mg by mouth every 8 (eight) hours as needed for nausea or vomiting. 01/06/17  Yes [provider]  potassium chloride SA (K-DUR,KLOR-CON) 20 MEQ tablet Take 20 mEq by mouth 2 (two) times daily.    Yes [provider]  saccharomyces boulardii (FLORASTOR) 250 MG capsule Take 250 mg by mouth 2 (two) times daily.    Yes [provider]  Semaglutide,0.25 or 0.5MG/DOS, 2 MG/1.5ML SOPN Inject 0.5 mg into the skin once a week. On Sunday. 11/22/19  Yes [provider]  valACYclovir (VALTREX) 500 MG tablet Take 500 mg by mouth See admin instructions. For fever blisters take 521m twice a day as needed 01/06/17  Yes [provider]  valsartan (DIOVAN) 160 MG tablet Take 160 mg by mouth every morning.    Yes [provider]  apixaban (ELIQUIS) 5 MG TABS tablet Take 1 tablet (5 mg total) by mouth 2 (two) times daily. Patient not taking: No sig reported 02/08/20   ALorella Nimrod MD  dicyclomine (BENTYL) 10 MG capsule Take 10 mg by mouth at bedtime.  05/16/19   [provider]  EPINEPHrine 0.3 mg/0.3 mL IJ SOAJ injection  05/18/19   [provider]  escitalopram (LEXAPRO) 5 MG tablet Take 5 mg by mouth at bedtime.    [provider]  esomeprazole (NEXIUM) 40 MG capsule Take  40 mg by mouth 2 (two) times daily before a meal.  02/08/16   [provider]  metoprolol tartrate (LOPRESSOR) 25 MG tablet Take 1 tablet (25 mg total) by mouth 2 (two) times daily. Patient not taking: No sig reported 02/08/20   ALorella Nimrod MD  NOVOLOG FLEXPEN 100 UNIT/ML FlexPen Based on sliding scale Patient not taking: Reported on 02/17/2020 08/14/19   [provider]  simvastatin (ZOCOR) 20 MG tablet Take 20 mg by mouth at bedtime.    [provider]    Family History  Problem Relation Age of Onset  . Breast cancer Paternal Grandmother   . Colon cancer Father   . Diabetes Sister   . Diabetes Brother   . Heart disease Brother   . Prostate cancer Brother   . Colon cancer Maternal Uncle   . Prostate cancer Brother   . Bladder Cancer Brother   . Leukemia Mother        all  . Ovarian cancer Neg Hx   . Kidney cancer Neg Hx      Social History   Tobacco Use  . Smoking status: Never Smoker  . Smokeless tobacco: Never Used  Vaping Use  . Vaping Use: Never used  Substance Use Topics  . Alcohol use: No    Alcohol/week: 0.0 standard drinks  . Drug use: No    Allergies as of 02/16/2020 - Review Complete 02/16/2020  Allergen Reaction Noted  . Codeine Itching and Nausea And Vomiting 06/28/2014  . Demeclocycline Rash 04/10/2012  . Demerol [meperidine] Itching and Nausea And Vomiting 06/28/2014  . Hydrocodone Itching and Nausea And Vomiting 08/24/2018  . Oxycodone Itching and Nausea And Vomiting 09/26/2015  . Pentazocine Itching and Nausea And Vomiting 04/10/2012  . Tetracyclines & related Rash 06/28/2014  . Fentanyl Itching and Nausea And Vomiting 06/28/2014    Review of Systems:    All systems reviewed and negative except where noted in HPI.   Physical Exam:  Vital signs in last 24 hours: Temp:  [97.9 F (36.6 C)-98.6 F (37 C)] 97.9 F (36.6 C) (01/05 2114) Pulse Rate:  [44-77] 53 (01/06 1000) Resp:  [14-25] 14 (01/06 1000) BP:  (115-184)/(53-93) 177/62 (01/06 1000) SpO2:  [92 %-  100 %] 99 % (01/06 1000) Weight:  [81.6 kg] 81.6 kg (01/05 1720)   General:   Pleasant, cooperative in NAD Head:  Normocephalic and atraumatic. Eyes:   No icterus.   Conjunctiva pink. PERRLA. Ears:  Normal auditory acuity. Neck:  Supple; no masses or thyroidomegaly Lungs: Respirations even and unlabored. Lungs clear to auscultation bilaterally.   No wheezes, crackles, or rhonchi.  Heart:  Regular rate and rhythm;  Without murmur, clicks, rubs or gallops Abdomen:  Soft, nondistended, nontender. Normal bowel sounds. No appreciable masses or hepatomegaly.  No rebound or guarding.  Rectal:  Not performed. Msk:  Symmetrical without gross deformities.    Extremities:  Without edema, cyanosis or clubbing. Neurologic:  Alert and oriented x3;  grossly normal neurologically. Skin:  Intact without significant lesions or rashes. Cervical Nodes:  No significant cervical adenopathy. Psych:  Alert and cooperative. Normal affect.  LAB RESULTS: Recent Labs    02/16/20 1724 02/17/20 0534  WBC 2.8* 2.0*  HGB 10.8* 10.0*  HCT 34.3* 30.7*  PLT 119* 87*   BMET Recent Labs    02/16/20 1724  NA 137  K 4.1  CL 101  CO2 27  GLUCOSE 215*  BUN 32*  CREATININE 1.18*  CALCIUM 8.5*   LFT Recent Labs    02/17/20 0127  PROT 7.7  ALBUMIN 3.7  AST 25  ALT 18  ALKPHOS 38  BILITOT 0.8  BILIDIR 0.2  IBILI 0.6   PT/INR Recent Labs    02/16/20 1724  LABPROT 15.7*  INR 1.3*    STUDIES: DG Chest 2 View  Result Date: 02/16/2020 CLINICAL DATA:  Shortness of breath EXAM: CHEST - 2 VIEW COMPARISON:  February 07, 2020 FINDINGS: There is interstitial thickening bilaterally with areas of scarring in the left mid lung. No evident consolidation or well-defined airspace opacity. Heart is mildly enlarged with pulmonary vascularity normal. No adenopathy. No bone lesions. IMPRESSION: Scarring left mid lung. Areas of interstitial thickening, likely due to  chronic bronchitis. No frank edema or airspace opacity evident. Stable cardiac prominence. Electronically Signed   By: Lowella Grip III M.D.   On: 02/16/2020 18:15   CT Angio Abd/Pel W and/or Wo Contrast  Result Date: 02/17/2020 CLINICAL DATA:  Bright red blood per rectum, dark blood in stool for 4 days, anticoagulated, history of breast cancer EXAM: CTA ABDOMEN AND PELVIS WITHOUT AND WITH CONTRAST TECHNIQUE: Multidetector CT imaging of the abdomen and pelvis was performed using the standard protocol during bolus administration of intravenous contrast. Multiplanar reconstructed images and MIPs were obtained and reviewed to evaluate the vascular anatomy. CONTRAST:  131m OMNIPAQUE IOHEXOL 350 MG/ML SOLN COMPARISON:  05/20/2017 FINDINGS: VASCULAR Aorta: Normal caliber aorta without aneurysm, dissection, vasculitis or significant stenosis. Celiac: Patent without evidence of aneurysm, dissection, vasculitis or significant stenosis. SMA: Patent without evidence of aneurysm, dissection, vasculitis or significant stenosis. Renals: There are 3 renal arteries seen bilaterally. Vessels are patent without evidence of aneurysm, dissection, or significant stenosis. IMA: Patent without evidence of aneurysm, dissection, vasculitis or significant stenosis. Inflow: Patent without evidence of aneurysm, dissection, vasculitis or significant stenosis. Proximal Outflow: Bilateral common femoral and visualized portions of the superficial and profunda femoral arteries are patent without evidence of aneurysm, dissection, vasculitis or significant stenosis. Veins: No obvious venous abnormality within the limitations of this arterial phase study. Review of the MIP images confirms the above findings. NON-VASCULAR Lower chest: Right pleural effusion volume estimated less than 1 L. Hypoventilatory changes at the lung bases. Hepatobiliary: Mild nodularity  of the liver capsule suggests cirrhosis. No focal liver abnormalities. The  gallbladder is surgically absent. Pancreas: Unremarkable. No pancreatic ductal dilatation or surrounding inflammatory changes. Spleen: Spleen is enlarged measuring 16.2 cm in craniocaudal length. No focal abnormalities. Adrenals/Urinary Tract: Adrenal glands are unremarkable. Kidneys are normal, without renal calculi, focal lesion, or hydronephrosis. Bladder is unremarkable. Stomach/Bowel: No bowel obstruction or ileus. No bowel wall thickening or inflammatory change. Scattered diverticulosis of the colon without diverticulitis. There is no accumulation of contrast within the bowel lumen to suggest active gastrointestinal hemorrhage. Lymphatic: Multiple borderline enlarged retroperitoneal lymph nodes are identified, largest measuring 12 mm in the left para-aortic region. These are nonspecific. Reproductive: Status post hysterectomy. No adnexal masses. Other: Small volume ascites. No free intraperitoneal gas. No abdominal wall hernia. Musculoskeletal: No acute or destructive bony lesions. Reconstructed images demonstrate no additional findings. IMPRESSION: 1. No evidence of active gastrointestinal bleeding. 2. Right pleural effusion, volume estimated less than 1 L. 3. 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 4. Diverticulosis without diverticulitis. 5. Borderline enlarged retroperitoneal lymph nodes, likely reactive. 6.  Aortic Atherosclerosis (ICD10-I70.0). Electronically Signed   By: Randa Ngo M.D.   On: 02/17/2020 03:23      Impression / Plan:   Assessment: Active Problems:   Hypothyroidism   HTN (hypertension)   Diabetes (HCC)   NASH (nonalcoholic steatohepatitis)   Hematochezia   Chronic anticoagulation   Anasarca   Symptomatic sinus bradycardia   Unspecified atrial fibrillation (HCC)   AKI (acute kidney injury) ([place])   History of colitis   Annette Foster is a 80 y.o. y/o female with a GI bleed with bright red blood per rectum that turned  into black stools.  The patient had stopped her Eliquis because she noticed the bleeding.  She has continued to have some dark stools while in the ER.  The patient's hemoglobin and hematocrit have been stable with her hemoglobin being 11.410 days ago and 10.8 yesterday.  The patient's hemoglobin is now 10.0.  The patient has a history of GI bleeding 15 years ago without a cause being found.  With the patient's abdominal pain and bloating with bright red blood per rectum turning into black stools the differential diagnosis includes a upper GI bleed although this is less likely versus a diverticular bleed versus ischemic colitis.  Plan:  This patient will be set up for an EGD and colonoscopy for tomorrow since she has been off of the Eliquis for greater than 2 days.  The patient will be given a prep today and be kept on clear liquids with n.p.o. after midnight.  The patient has been explained the plan and agrees with it.  Thank you for involving me in the care of this patient.      LOS: 0 days   Annette Lame, MD, [facility] 02/17/2020, 11:11 AM,  Pager [phone] 7am-5pm  Check AMION for 5pm -7am coverage and on weekends   Note: This dictation was prepared with Dragon dictation along with smaller phrase technology. Any transcriptional errors that result from this process are unintentional.

## 2020-02-17 NOTE — Progress Notes (Signed)
Per ER nurse, she spoke with endo doctor and was told to not start bowel prep until 5 hours prior to procedure. ER nurse passed along in report via secure chat.

## 2020-02-17 NOTE — Progress Notes (Signed)
Inpatient Diabetes Program Recommendations  AACE/ADA: New Consensus Statement on Inpatient Glycemic Control (2015)  Target Ranges:  Prepandial:   less than 140 mg/dL      Peak postprandial:   less than 180 mg/dL (1-2 hours)      Critically ill patients:  140 - 180 mg/dL   Lab Results  Component Value Date   GLUCAP 148 (H) 02/17/2020   HGBA1C 9.1 (H) 02/07/2020    Review of Glycemic Control Results for Annette Foster, Annette Foster (MRN [id number]) as of 02/17/2020 12:05  Ref. Range 02/17/2020 08:24  Glucose-Capillary Latest Ref Range: 70 - 99 mg/dL 213 (H)   Diabetes history: DM 2 Outpatient Diabetes medications: Amaryl 2 mg daily, Novolog 15 units tid with meals, Tresiba 50 units daily Current orders for Inpatient glycemic control:  Novolog moderate tid with meals and HS  Inpatient Diabetes Program Recommendations:   Please add Lantus 20 units daily and consider increasing frequency of Novolog correction to q 4 hours.   Thanks,  Beryl Meager, RN, BC-ADM Inpatient Diabetes Coordinator Pager [phone] (8a-5p)

## 2020-02-17 NOTE — Consult Note (Signed)
Cardiology Consultation:   Patient ID: Chantrell Apsey MRN: [id number]; DOB: [birth year]  Admit date: 02/17/2020 Date of Consult: 02/17/2020  Primary Care Provider: Albina Billet, MD [facility] Cardiologist: Nelva Bush, MD  [facility] Electrophysiologist:  None    Patient Profile:   Annette Foster is a 80 y.o. female with a hx of reported cardiac surgery in the [id number] of uncertain etiology at [facility] (? valvular heart disease), CHF, left-sided breast cancer status postmastectomy, colitis with intermittent GI bleeding, insulin-dependent diabetes, pancytopenia, hypertension, hyperlipidemia, NASH, iron deficiency anemia requiring iron infusions, seizure disorder, OSA on CPAP, reflux who is being seen today for the evaluation of a flutter with RVR at the request of Dr. Loleta Books.  History of Present Illness:   Ms. Miranda previously underwent open cardiac surgery at [facility] in the 1970s.  She also reported history of CHF.  She was followed by cardiology in [place] up until 2015 when she relocated to [facility] and did not establish care with a cardiologist.   Patient was recently admitted 12/27- 12/28 for new onset atrial fibrillation/flutter with RVR and she was seen by cardiology.  Rates improved on IV diltiazem.  Labs show troponin negative x2. BNP was mildly elevated. Patient had a local erythematous reaction at IV site so she was transitioned to Toprol tartrate 25 mg twice daily. She was also started on digoxin.  Echo showed preserved EF with moderately dilated left atrium.  Anticoagulation was indicated given CHA2DS2-VASc of 6 however given her chronic iron deficiency anemia, chronic liver disease, and chronic colitis with intermittent bleeding was recommended GI weigh in regarding risks of bleeding. However, GI did not weigh in given chronicity of GI issues. She was started on IV heparin was later transitioned to Eliquis. Plan was for possible elective cardioversion as  outpatient.  Patient presented to the ED 02/16/2020 for shortness of breath and rectal bleeding. Four to five days ago the patient noted 1 episode of BRBPR. She stayed at home that day and took it easy. She had another episode the next day as well. On the third day her BM had a large amount of blood.  Dhe stopped Eliquis that night. The next day she called the cardiology office who recommended holding Eliquis and going to the ER for evaluation. Of note digoxin was previously stopped since digoxin level came back high and she had facial swelling. The patient also reported progressive DOE and fatigue. She reported a 16lbs weight gain with some lower leg edema and severe abdominal fullness. Denies chest pain, fever, chills, nausea, vomiting.  In the ED blood pressure 129/93, afebrile, 98% O2.  EKG showed sinus bradycardia with first-degree AV block, 48 bpm with T wave inversion in inferolateral leads.  Labs showed sodium 137, potassium 4.1, creatinine 1.18, BUN 32, WBC 2.8, hemoglobin 10.8, platelets 119K.  BNP 377. HS trop 15>13.  Respiratory panel negative.  Chest x-ray showed no frank edema or airspace opacity evident, contact bronchitis.  Digoxin level 0.5.  Patient was admitted for further work-up.  Past Medical History:  Diagnosis Date  . Abdominal pain   . Allergy   . Cancer [facility]) 2017   breast cancer- Left  . Cataract   . CHF (congestive heart failure) ([place])   . Collagenous colitis   . Diabetes mellitus without complication ([place])   . Diarrhea   . Diverticulosis   . Fatty liver disease, nonalcoholic   . Fibrocystic breast   . GERD (gastroesophageal reflux disease)   . Heart murmur   .  Hyperlipidemia   . Hypertension   . Hypothyroidism   . IBS (irritable bowel syndrome)   . IDA (iron deficiency anemia)   . Personal history of radiation therapy 2017   LEFT BREAST CA  . PONV (postoperative nausea and vomiting)   . Sleep apnea    C-Pap    Past Surgical History:  Procedure Laterality  Date  . ABDOMINAL HYSTERECTOMY     tah bso  . ABDOMINAL SURGERY    . APPENDECTOMY    . BREAST BIOPSY Bilateral 2016   negative  . 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 BREAST BIOPSY Left 05/27/2016   BENIGN MAMMARY EPITHELIUM  . BREAST BIOPSY Left 11/20/2017   affirm bx x clip BENIGN MAMMARY EPITHELIUM CONSISTENT WITH RAD THERAPY  . BREAST LUMPECTOMY Left 10/17/2015   DCIS and papillary carcinoma insitu, clear margins  . CARDIAC SURGERY     has replacement valve  . CATARACT EXTRACTION Right   . CHOLECYSTECTOMY    . COLONOSCOPY WITH PROPOFOL N/A 03/11/2016   Procedure: COLONOSCOPY WITH PROPOFOL;  Surgeon: Manya Silvas, MD;  Location: [facility] ENDOSCOPY;  Service: Endoscopy;  Laterality: N/A;  . ESOPHAGOGASTRODUODENOSCOPY (EGD) WITH PROPOFOL N/A 03/11/2016   Procedure: ESOPHAGOGASTRODUODENOSCOPY (EGD) WITH PROPOFOL;  Surgeon: Manya Silvas, MD;  Location: [facility] ENDOSCOPY;  Service: Endoscopy;  Laterality: N/A;  . FINGER ARTHROSCOPY WITH CARPOMETACARPEL ([place]) ARTHROPLASTY Right 09/03/2018   Procedure: CARPOMETACARPEL [facility]) ARTHROPLASTY RIGHT THUMB;  Surgeon: Hessie Knows, MD;  Location: [facility] ORS;  Service: Orthopedics;  Laterality: Right;  . GANGLION CYST EXCISION Right 09/03/2018   Procedure: REMOVAL GANGLION OF WRIST;  Surgeon: Hessie Knows, MD;  Location: [facility] ORS;  Service: Orthopedics;  Laterality: Right;  . HARDWARE REMOVAL Right 09/03/2018   Procedure: HARDWARE REMOVAL RIGHT THUMB;  Surgeon: Hessie Knows, MD;  Location: [facility] ORS;  Service: Orthopedics;  Laterality: Right;  staple removed  . JOINT REPLACEMENT Left    TKR  . left sinusplasty     . MASTECTOMY, PARTIAL Left 10/17/2015   Procedure: MASTECTOMY PARTIAL REVISION;  Surgeon: Leonie Green, MD;  Location: [facility] ORS;  Service: General;  Laterality: Left;  . PARTIAL MASTECTOMY WITH NEEDLE LOCALIZATION Left 09/29/2015   Procedure: PARTIAL MASTECTOMY WITH NEEDLE LOCALIZATION;  Surgeon: Leonie Green, MD;  Location: [facility] ORS;  Service: General;  Laterality: Left;  . TOTAL ABDOMINAL HYSTERECTOMY W/ BILATERAL SALPINGOOPHORECTOMY       Home Medications:  Prior to Admission medications   Medication Sig Start Date End Date Taking? Authorizing Provider  benzonatate (TESSALON) 200 MG capsule Take 200 mg by mouth 3 (three) times daily as needed for cough.   Yes [provider]  Cholecalciferol (VITAMIN D) 50 MCG (2000 UT) CAPS Take 2,000 Units by mouth daily.   Yes [provider]  clobetasol ointment (TEMOVATE) 5.32 % Apply 1 application topically 2 (two) times a week. 11/18/19  Yes Harlin Heys, MD  cyanocobalamin (,VITAMIN B-12,) 1000 MCG/ML injection 1,000 mcg every 30 (thirty) days. 04/23/19  Yes [provider]  docusate sodium (COLACE) 100 MG capsule Take 1 capsule by mouth 2 (two) times daily as needed. 01/10/20  Yes [provider]  estradiol (ESTRACE) 0.1 MG/GM vaginal cream Place 0.23 Applicatorfuls vaginally 2 (two) times a week. 08/19/19 08/18/20 Yes Harlin Heys, MD  fenofibrate 160 MG tablet Take 160 mg by mouth at bedtime.   Yes [provider]  fluticasone (FLONASE) 50 MCG/ACT nasal spray Place 1 spray into  the nose daily as needed for allergies.   Yes [provider]  furosemide (LASIX) 20 MG tablet Take 20-40 mg by mouth See admin instructions. Take once a day. alternate taking 63m and 475m   Yes [provider]  gabapentin (NEURONTIN) 300 MG capsule Take 300 mg by mouth at bedtime.  09/07/16 10/25/36 Yes [provider]  glimepiride (AMARYL) 2 MG tablet Take 2 mg by mouth at bedtime.   Yes [provider]  Hyoscyamine Sulfate 0.375 MG TBCR Take 0.375 mg by mouth 2 (two) times daily.    Yes [provider]  insulin aspart (NOVOLOG) 100 UNIT/ML FlexPen Inject 15 Units into the skin 3 (three) times daily with meals.   Yes [provider]  Insulin Degludec (TRESIBA)  100 UNIT/ML SOLN Inject 50 mg into the skin at bedtime.   Yes [provider]  levothyroxine (SYNTHROID, LEVOTHROID) 75 MCG tablet Take 75 mcg by mouth daily before breakfast.    Yes [provider]  magnesium oxide (MAG-OX) 400 MG tablet Take 400 mg by mouth 2 (two) times daily.    Yes [provider]  metFORMIN (GLUCOPHAGE-XR) 750 MG 24 hr tablet Take 750 mg by mouth daily with breakfast.    Yes [provider]  ondansetron (ZOFRAN) 4 MG tablet Take 4 mg by mouth every 8 (eight) hours as needed for nausea or vomiting. 01/06/17  Yes [provider]  potassium chloride SA (K-DUR,KLOR-CON) 20 MEQ tablet Take 20 mEq by mouth 2 (two) times daily.    Yes [provider]  saccharomyces boulardii (FLORASTOR) 250 MG capsule Take 250 mg by mouth 2 (two) times daily.    Yes [provider]  Semaglutide,0.25 or 0.5MG/DOS, 2 MG/1.5ML SOPN Inject 0.5 mg into the skin once a week. On Sunday. 11/22/19  Yes [provider]  valACYclovir (VALTREX) 500 MG tablet Take 500 mg by mouth See admin instructions. For fever blisters take [id number] a day as needed 01/06/17  Yes [provider]  valsartan (DIOVAN) 160 MG tablet Take 160 mg by mouth every morning.    Yes [provider]  apixaban (ELIQUIS) 5 MG TABS tablet Take 1 tablet (5 mg total) by mouth 2 (two) times daily. Patient not taking: No sig reported 02/08/20   AmiLorella NimrodD  dicyclomine (BENTYL) 10 MG capsule Take 10 mg by mouth at bedtime.  05/16/19   [provider]  EPINEPHrine 0.3 mg/0.3 mL IJ SOAJ injection  05/18/19   [provider]  escitalopram (LEXAPRO) 5 MG tablet Take 5 mg by mouth at bedtime.    [provider]  esomeprazole (NEXIUM) 40 MG capsule Take 40 mg by mouth 2 (two) times daily before a meal.  02/08/16   [provider]  metoprolol tartrate (LOPRESSOR) 25 MG tablet Take 1 tablet (25 mg total) by mouth 2 (two) times  daily. Patient not taking: No sig reported 02/08/20   AmiLorella NimrodD  NOVOLOG FLEXPEN 100 UNIT/ML FlexPen Based on sliding scale Patient not taking: Reported on 02/17/2020 08/14/19   [provider]  simvastatin (ZOCOR) 20 MG tablet Take 20 mg by mouth at bedtime.    [provider]    Inpatient Medications: Scheduled Meds: . insulin aspart  0-15 Units Subcutaneous TID WC  . insulin aspart  0-5 Units Subcutaneous QHS  . insulin glargine  20 Units Subcutaneous Daily  . polyethylene glycol-electrolytes  4,000 mL Oral Once   Continuous Infusions: . pantoprozole (PROTONIX) infusion  8 mg/hr (02/17/20 0153)   PRN Meds: acetaminophen **OR** acetaminophen, ondansetron **OR** ondansetron (ZOFRAN) IV  Allergies:    Allergies  Allergen Reactions  . Codeine Itching and Nausea And Vomiting  . Demeclocycline Rash  . Demerol [Meperidine] Itching and Nausea And Vomiting  . Hydrocodone Itching and Nausea And Vomiting  . Oxycodone Itching and Nausea And Vomiting  . Pentazocine Itching and Nausea And Vomiting  . Tetracyclines & Related Rash  . Fentanyl Itching and Nausea And Vomiting    D/W surgical nurse on 7/23 and pt reportedly tolerated dose during surgery.  Dose was given by MD.  Pt reported that she only gets itching and did not object to receiving med    Social History:   Social History   Socioeconomic History  . Marital status: Married    Spouse name: Not on file  . Number of children: Not on file  . Years of education: Not on file  . Highest education level: Not on file  Occupational History  . Not on file  Tobacco Use  . Smoking status: Never Smoker  . Smokeless tobacco: Never Used  Vaping Use  . Vaping Use: Never used  Substance and Sexual Activity  . Alcohol use: No    Alcohol/week: 0.0 standard drinks  . Drug use: No  . Sexual activity: Not Currently    Birth control/protection: Surgical  Other Topics Concern  . Not on file  Social History  Narrative  . Not on file   Social Determinants of Health   Financial Resource Strain: Not on file  Food Insecurity: Not on file  Transportation Needs: Not on file  Physical Activity: Not on file  Stress: Not on file  Social Connections: Not on file  Intimate Partner Violence: Not on file    Family History:   Family History  Problem Relation Age of Onset  . Breast cancer Paternal Grandmother   . Colon cancer Father   . Diabetes Sister   . Diabetes Brother   . Heart disease Brother   . Prostate cancer Brother   . Colon cancer Maternal Uncle   . Prostate cancer Brother   . Bladder Cancer Brother   . Leukemia Mother        all  . Ovarian cancer Neg Hx   . Kidney cancer Neg Hx      ROS:  Please see the history of present illness.  All other ROS reviewed and negative.     Physical Exam/Data:   Vitals:   02/17/20 0815 02/17/20 0930 02/17/20 1000 02/17/20 1230  BP:  (!) 184/93 (!) 177/62 (!) 158/64  Pulse: (!) 57 (!) 55 (!) 53 (!) 57  Resp: 20 17 14 20   Temp:      TempSrc:      SpO2: 96% 97% 99% 95%  Weight:      Height:       No intake or output data in the 24 hours ending 02/17/20 1313 Last 3 Weights 02/16/2020 02/07/2020 01/18/2020  Weight (lbs) 180 lb 165 lb 168 lb 9.6 oz  Weight (kg) 81.647 kg 74.844 kg 76.476 kg     Body mass index is 31.89 kg/m.  General:  Well nourished, well developed, in no acute distress HEENT: normal Lymph: no adenopathy Neck: no JVD Endocrine:  No thryomegaly Vascular: No carotid bruits; FA pulses 2+ bilaterally without bruits  Cardiac:  normal S1, S2; RRR; + murmur  Lungs:  clear to auscultation bilaterally, no wheezing, rhonchi or rales  Abd:  firm, hepatomegaly  Ext: no edema Musculoskeletal:  No deformities, BUE and BLE strength normal and equal Skin: warm and dry  Neuro:  CNs 2-12 intact, no focal abnormalities noted Psych:  Normal affect   EKG:  The EKG was personally reviewed and demonstrates:  SB, 1st degree AV block,  inferolateral TWI, 48bpm Telemetry:  Telemetry was personally reviewed and demonstrates:  SR/SB HR upper 40s improving to the 60s now. PVCs, pauses all less than 3 seconds  Relevant CV Studies:  Echo 02/07/21 1. Left ventricular ejection fraction, by estimation, is 50 to 55%. The  left ventricle has low normal function. Left ventricular endocardial  border not optimally defined to evaluate regional wall motion. There is  mild left ventricular hypertrophy. Left  ventricular diastolic parameters are indeterminate.  2. Right ventricular systolic function is mildly reduced. The right  ventricular size is normal. Mildly increased right ventricular wall  thickness. There is moderately elevated pulmonary artery systolic  pressure.  3. Left atrial size was moderately dilated.  4. Right atrial size was mildly dilated.  5. The pericardial effusion is posterior to the left ventricle.  6. The mitral valve is abnormal. Trivial mitral valve regurgitation.  7. Tricuspid valve regurgitation is mild to moderate.  8. The aortic valve is tricuspid. There is mild calcification of the  aortic valve. There is mild thickening of the aortic valve. Aortic valve  regurgitation is not visualized. Mild to moderate aortic valve  sclerosis/calcification is present, without any  evidence of aortic stenosis.  9. There is mild dilatation of the ascending aorta, measuring 36 mm.  10. The inferior vena cava is dilated in size with <50% respiratory  variability, suggesting right atrial pressure of 15 mmHg.   Laboratory Data:  High Sensitivity Troponin:   Recent Labs  Lab 02/07/20 1452 02/07/20 1720 02/16/20 1724 02/16/20 2104  TROPONINIHS 14 14 15 13      Chemistry Recent Labs  Lab 02/11/20 1338 02/16/20 1724  NA 131* 137  K 4.9 4.1  CL 99 101  CO2 25 27  GLUCOSE 414* 215*  BUN 18 32*  CREATININE 0.82 1.18*  CALCIUM 8.9 8.5*  GFRNONAA >60 47*  ANIONGAP 7 9    Recent Labs  Lab  02/17/20 0127  PROT 7.7  ALBUMIN 3.7  AST 25  ALT 18  ALKPHOS 38  BILITOT 0.8   Hematology Recent Labs  Lab 02/16/20 1724 02/17/20 0534  WBC 2.8* 2.0*  RBC 3.56* 3.24*  HGB 10.8* 10.0*  HCT 34.3* 30.7*  MCV 96.3 94.8  MCH 30.3 30.9  MCHC 31.5 32.6  RDW 14.4 14.3  PLT 119* 87*   BNP Recent Labs  Lab 02/16/20 1724  BNP 377.8*    DDimer No results for input(s): DDIMER in the last 168 hours.   Radiology/Studies:  DG Chest 2 View  Result Date: 02/16/2020 CLINICAL DATA:  Shortness of breath EXAM: CHEST - 2 VIEW COMPARISON:  February 07, 2020 FINDINGS: There is interstitial thickening bilaterally with areas of scarring in the left mid lung. No evident consolidation or well-defined airspace opacity. Heart is mildly enlarged with pulmonary vascularity normal. No adenopathy. No bone lesions. IMPRESSION: Scarring left mid lung. Areas of interstitial thickening, likely due to chronic bronchitis. No frank edema or airspace opacity evident. Stable cardiac prominence. Electronically Signed   By: Lowella Grip III M.D.   On: 02/16/2020 18:15   CT Angio Abd/Pel W and/or Wo Contrast  Result Date: 02/17/2020 CLINICAL DATA:  Bright red blood per rectum,  dark blood in stool for 4 days, anticoagulated, history of breast cancer EXAM: CTA ABDOMEN AND PELVIS WITHOUT AND WITH CONTRAST TECHNIQUE: Multidetector CT imaging of the abdomen and pelvis was performed using the standard protocol during bolus administration of intravenous contrast. Multiplanar reconstructed images and MIPs were obtained and reviewed to evaluate the vascular anatomy. CONTRAST:  175m OMNIPAQUE IOHEXOL 350 MG/ML SOLN COMPARISON:  05/20/2017 FINDINGS: VASCULAR Aorta: Normal caliber aorta without aneurysm, dissection, vasculitis or significant stenosis. Celiac: Patent without evidence of aneurysm, dissection, vasculitis or significant stenosis. SMA: Patent without evidence of aneurysm, dissection, vasculitis or significant  stenosis. Renals: There are 3 renal arteries seen bilaterally. Vessels are patent without evidence of aneurysm, dissection, or significant stenosis. IMA: Patent without evidence of aneurysm, dissection, vasculitis or significant stenosis. Inflow: Patent without evidence of aneurysm, dissection, vasculitis or significant stenosis. Proximal Outflow: Bilateral common femoral and visualized portions of the superficial and profunda femoral arteries are patent without evidence of aneurysm, dissection, vasculitis or significant stenosis. Veins: No obvious venous abnormality within the limitations of this arterial phase study. Review of the MIP images confirms the above findings. NON-VASCULAR Lower chest: Right pleural effusion volume estimated less than 1 L. Hypoventilatory changes at the lung bases. Hepatobiliary: Mild nodularity of the liver capsule suggests cirrhosis. No focal liver abnormalities. The gallbladder is surgically absent. Pancreas: Unremarkable. No pancreatic ductal dilatation or surrounding inflammatory changes. Spleen: Spleen is enlarged measuring 16.2 cm in craniocaudal length. No focal abnormalities. Adrenals/Urinary Tract: Adrenal glands are unremarkable. Kidneys are normal, without renal calculi, focal lesion, or hydronephrosis. Bladder is unremarkable. Stomach/Bowel: No bowel obstruction or ileus. No bowel wall thickening or inflammatory change. Scattered diverticulosis of the colon without diverticulitis. There is no accumulation of contrast within the bowel lumen to suggest active gastrointestinal hemorrhage. Lymphatic: Multiple borderline enlarged retroperitoneal lymph nodes are identified, largest measuring 12 mm in the left para-aortic region. These are nonspecific. Reproductive: Status post hysterectomy. No adnexal masses. Other: Small volume ascites. No free intraperitoneal gas. No abdominal wall hernia. Musculoskeletal: No acute or destructive bony lesions. Reconstructed images demonstrate no  additional findings. IMPRESSION: 1. No evidence of active gastrointestinal bleeding. 2. Right pleural effusion, volume estimated less than 1 L. 3. 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 4. Diverticulosis without diverticulitis. 5. Borderline enlarged retroperitoneal lymph nodes, likely reactive. 6.  Aortic Atherosclerosis (ICD10-I70.0). Electronically Signed   By: MRanda NgoM.D.   On: 02/17/2020 03:23     Assessment and Plan:   Rectal Bleeding with history of colitis - Presented with 2 weeks of intermittent BRBPR and dark stools - Pt started on Eliquis 12/17 for new onset Afib. Last dose of Eliquis was 1/4 AM dose. Patient self discontinued it du to bleeding. Dr. ESaunders Revelalso instructed discontinuation of medication. - Stool occult positive - H&H stable - PPI - GI consulted and plan for EGD/colonoscopy tomorrow. Continue to hold a/c  Anasarca with history of cirrhosis and NASH - Pt reported 16lbs weight gain. On exam she has extreme abdominal fullness. Minimal LLE and lungs clear.  - CT abdomen showing cirrhosis with portal HTN with splenomegaly and ascitis - LFTS normal - PTA lasix>> ?held for AKI. Likely would trial lasix but might need paracentesis - GI following  Paroxysmal Afib - recently diagnosed Afib - On arrival EKG shows SB with 1st degree AV block - PTA lopressor 251mBID for rate control>>held for bradycardia. Last dose was 1/4 AM dose - Dig level previously high and this was held,  as well for facial swelling. Lost dose of dig was 1/3.  - continue telemetry - CHADSVASC= 6 - Eliquis discontinued as above. Further recs per EGD/colonscopy.  - On arrival patient was bradycardic to upper 40s. Pts last dose of dig was 1/3 and lopressor 1/4 AM dose. Heart rates seem to be improving to 50-60s on telemetry. Would allow for BB washout and reassess HR. If she can tolerate can try lower dose BB. Will discuss with MD.   Volume overload with  history of diastolic CHF - Previous Echo showed preserved EF - BNP 377 - Pt was on lasix at home however still had weight gain. PTA rotated lasix 38m and lasix 433m - Pt has severe abd fullness on exam. No significant LLE, lungs clear. Abd edema mostly due to ?GI issues as above. Will defer IV lasix to MD  Pancytopenia - Plts around baseline  HTN - PTA lopressor and valsartan - BB held for ?bradycardia - ARM held for AKI - pt was started on Amlodipine 74m71maily and hydralazine as needed  AKI - creatinine 1.18 on admission - valsartan held - follow BMP - will check labs today  For questions or updates, please contact [facility] consult [email or web address] for contact info under    Signed, Aisia Correira H FNinfa MeekerA-C  02/17/2020 1:13 PM

## 2020-02-17 NOTE — Progress Notes (Signed)
Pt noted to have clear liquid bowel movements. NPO at midnight for GI scope in AM.

## 2020-02-17 NOTE — ED Notes (Signed)
bg 148

## 2020-02-17 NOTE — Progress Notes (Signed)
PROGRESS NOTE    Annette Foster  DJS:[id number] DOB: [birth year] DOA: 02/17/2020 PCP: Annette Billet, MD      Brief Narrative:  Annette Foster is a 80 y.o. F with obesity, DM, NASH cirrhosis compensated, hypothyroidism, recently diagnosed atrial fibrillation, collagenous colitis, OSA on CPAP, GI bleed and iron deficiency anemia who presented with rectal bleeding.  Patient recently presented with palpitations 10 days ago, found to have new onset A. fib with RVR.  Cardiology was consulted, who recommended heparin challenge given her history of GI bleeding.  She had no bleeding, and her rate was controlled with digoxin and metoprolol and she was discharged on apixaban.  She was okay for about a week, then around 4 days ago she had a large blood bowel movement.  She continued to have smaller red bloody bowel movements over the subsequent days, without dizziness, dyspnea, or palpitations.  Apparently yesterday morning she had a "dark" BM so she came to the ER.  In the ER, Hgb 10.8 g/dL from previous baseline 11 g/dL at last discharge.  Started on Protonix and admitted.          Assessment & Plan:  Acute GI bleed Differential includes more likely diverticular bleeding, ischemic colitis, less likely gastric ulcer.  Unlikely esophageal.  -Hold Eliquis -Continue PPI  -Consult GI, appreciate cares, EGD and colonoscopy tomorrow -Diet and prep per GI    Acute blood loss anemia -Transfusion threshold 7 g.dL    Diabetes Glucoses elevated overnight -Hold home metformin, glimepiride, semaglutide, Degludec -Start lantus -SS correction insulin  Paroxysmal atrial fibrillation HR slow and in sinus -Hold Eliquis    Thrombocytopenia NASH cirrhosis  Hypertension BP very high -Continue Lasix -Hold metoprolol due to bradycardia -Hold valsartan due to renal insufficiency -Start amlodipine -Hydralazine PRN -Continue simvastatin, fibrate  Hypothyroidism -Continue  levothyroxine  AKI Cr baseline is 0.8, currently is >1.1 mg/dL, with elevated BUN:Cr ratio. -IV fluids -Hold valsartan  IBS -Continue dicyclomine, Lexapro, hyoscyamine         Disposition: Status is: Inpatient  Remains inpatient appropriate because:Ongoing diagnostic testing needed not appropriate for outpatient work up   Dispo: The patient is from: Home              Anticipated d/c is to: Home              Anticipated d/c date is: 2 days              Patient currently is not medically stable to d/c.              MDM: This is a no charge note.  For further details, please see H&P by my partner Dr. Damita Dunnings from earlier today.  The below labs and imaging reports were reviewed and summarized above.    DVT prophylaxis: SCDs Start: 02/17/20 0425  Code Status: FULL Family Communication: Called to daughter, no answer    Consultants:   GI  Cardiology  Procedures:   1/7 EGD pending  1/7 colonoscopy pending       Subjective: Feeling ewll.  One dark stool overnight.  No dizziness, no confusion.  No palpitations, dyspnea.  Notes leg sewlling.        Objective: Vitals:   02/17/20 1000 02/17/20 1230 02/17/20 1400 02/17/20 1430  BP: (!) 177/62 (!) 158/64 (!) 139/47 (!) 148/89  Pulse: (!) 53 (!) 57 (!) 58 62  Resp: 14 20 (!) 22 16  Temp:      TempSrc:  SpO2: 99% 95% 97% 96%  Weight:      Height:       No intake or output data in the 24 hours ending 02/17/20 1451 Filed Weights   02/16/20 1720  Weight: 81.6 kg    Examination: The patient was seen and examined.      Data Reviewed: I have personally reviewed following labs and imaging studies:  CBC: Recent Labs  Lab 02/16/20 1724 02/17/20 0534  WBC 2.8* 2.0*  HGB 10.8* 10.0*  HCT 34.3* 30.7*  MCV 96.3 94.8  PLT 119* 87*   Basic Metabolic Panel: Recent Labs  Lab 02/11/20 1338 02/16/20 1724  NA 131* 137  K 4.9 4.1  CL 99 101  CO2 25 27  GLUCOSE 414* 215*  BUN 18 32*   CREATININE 0.82 1.18*  CALCIUM 8.9 8.5*   GFR: Estimated Creatinine Clearance: 39.1 mL/min (A) (by C-G formula based on SCr of 1.18 mg/dL (H)). Liver Function Tests: Recent Labs  Lab 02/17/20 0127  AST 25  ALT 18  ALKPHOS 38  BILITOT 0.8  PROT 7.7  ALBUMIN 3.7   Recent Labs  Lab 02/17/20 0127  LIPASE 57*   No results for input(s): AMMONIA in the last 168 hours. Coagulation Profile: Recent Labs  Lab 02/16/20 1724  INR 1.3*   Cardiac Enzymes: No results for input(s): CKTOTAL, CKMB, CKMBINDEX, TROPONINI in the last 168 hours. BNP (last 3 results) No results for input(s): PROBNP in the last 8760 hours. HbA1C: No results for input(s): HGBA1C in the last 72 hours. CBG: Recent Labs  Lab 02/17/20 0824 02/17/20 1221  GLUCAP 148* 228*   Lipid Profile: No results for input(s): CHOL, HDL, LDLCALC, TRIG, CHOLHDL, LDLDIRECT in the last 72 hours. Thyroid Function Tests: No results for input(s): TSH, T4TOTAL, FREET4, T3FREE, THYROIDAB in the last 72 hours. Anemia Panel: No results for input(s): VITAMINB12, FOLATE, FERRITIN, TIBC, IRON, RETICCTPCT in the last 72 hours. Urine analysis:    Component Value Date/Time   COLORURINE YELLOW (A) 06/15/2019 1039   APPEARANCEUR CLEAR (A) 06/15/2019 1039   APPEARANCEUR Cloudy (A) 07/08/2017 1103   LABSPEC 1.014 06/15/2019 1039   PHURINE 6.0 06/15/2019 1039   GLUCOSEU Large (3+) (A) 08/18/2019 1433   GLUCOSEU NEGATIVE 06/15/2019 1039   HGBUR NEGATIVE 06/15/2019 1039   BILIRUBINUR neg 09/30/2019 0911   BILIRUBINUR Negative 07/08/2017 1103   KETONESUR NEGATIVE 06/15/2019 1039   PROTEINUR Negative 09/30/2019 0911   PROTEINUR NEGATIVE 06/15/2019 1039   UROBILINOGEN 0.2 09/30/2019 0911   NITRITE neg 09/30/2019 0911   NITRITE NEGATIVE 06/15/2019 1039   LEUKOCYTESUR Negative 09/30/2019 0911   LEUKOCYTESUR TRACE (A) 06/15/2019 1039   Sepsis Labs: @LABRCNTIP (procalcitonin:4,lacticacidven:4)  ) Recent Results (from the past 240  hour(s))  Resp Panel by RT-PCR (Flu A&B, Covid) Nasopharyngeal Swab     Status: None   Collection Time: 02/07/20  2:52 PM   Specimen: Nasopharyngeal Swab; Nasopharyngeal(NP) swabs in vial transport medium  Result Value Ref Range Status   SARS Coronavirus 2 by RT PCR NEGATIVE NEGATIVE Final    Comment: (NOTE) SARS-CoV-2 target nucleic acids are NOT DETECTED.  The SARS-CoV-2 RNA is generally detectable in upper respiratory specimens during the acute phase of infection. The lowest concentration of SARS-CoV-2 viral copies this assay can detect is 138 copies/mL. A negative result does not preclude SARS-Cov-2 infection and should not be used as the sole basis for treatment or other patient management decisions. A negative result may occur with  improper specimen collection/handling, submission of specimen  other than nasopharyngeal swab, presence of viral mutation(s) within the areas targeted by this assay, and inadequate number of viral copies(<138 copies/mL). A negative result must be combined with clinical observations, patient history, and epidemiological information. The expected result is Negative.  Fact Sheet for Patients:  EntrepreneurPulse.com.au  Fact Sheet for Healthcare Providers:  IncredibleEmployment.be  This test is no t yet approved or cleared by the [place] FDA and  has been authorized for detection and/or diagnosis of SARS-CoV-2 by FDA under an Emergency Use Authorization (EUA). This EUA will remain  in effect (meaning this test can be used) for the duration of the COVID-19 declaration under Section 564(b)(1) of the Act, 21 U.S.C.section 360bbb-3(b)(1), unless the authorization is terminated  or revoked sooner.       Influenza A by PCR NEGATIVE NEGATIVE Final   Influenza B by PCR NEGATIVE NEGATIVE Final    Comment: (NOTE) The Xpert Xpress SARS-CoV-2/FLU/RSV plus assay is intended as an aid in the diagnosis of influenza from  Nasopharyngeal swab specimens and should not be used as a sole basis for treatment. Nasal washings and aspirates are unacceptable for Xpert Xpress SARS-CoV-2/FLU/RSV testing.  Fact Sheet for Patients: EntrepreneurPulse.com.au  Fact Sheet for Healthcare Providers: IncredibleEmployment.be  This test is not yet approved or cleared by the [place] FDA and has been authorized for detection and/or diagnosis of SARS-CoV-2 by FDA under an Emergency Use Authorization (EUA). This EUA will remain in effect (meaning this test can be used) for the duration of the COVID-19 declaration under Section 564(b)(1) of the Act, 21 U.S.C. section 360bbb-3(b)(1), unless the authorization is terminated or revoked.  Performed at [facility], [address] [place], [place] [address]   Resp Panel by RT-PCR (Flu A&B, Covid) Nasopharyngeal Swab     Status: None   Collection Time: 02/17/20  1:27 AM   Specimen: Nasopharyngeal Swab; Nasopharyngeal(NP) swabs in vial transport medium  Result Value Ref Range Status   SARS Coronavirus 2 by RT PCR NEGATIVE NEGATIVE Final    Comment: (NOTE) SARS-CoV-2 target nucleic acids are NOT DETECTED.  The SARS-CoV-2 RNA is generally detectable in upper respiratory specimens during the acute phase of infection. The lowest concentration of SARS-CoV-2 viral copies this assay can detect is 138 copies/mL. A negative result does not preclude SARS-Cov-2 infection and should not be used as the sole basis for treatment or other patient management decisions. A negative result may occur with  improper specimen collection/handling, submission of specimen other than nasopharyngeal swab, presence of viral mutation(s) within the areas targeted by this assay, and inadequate number of viral copies(<138 copies/mL). A negative result must be combined with clinical observations, patient history, and epidemiological information. The expected  result is Negative.  Fact Sheet for Patients:  EntrepreneurPulse.com.au  Fact Sheet for Healthcare Providers:  IncredibleEmployment.be  This test is no t yet approved or cleared by the [place] FDA and  has been authorized for detection and/or diagnosis of SARS-CoV-2 by FDA under an Emergency Use Authorization (EUA). This EUA will remain  in effect (meaning this test can be used) for the duration of the COVID-19 declaration under Section 564(b)(1) of the Act, 21 U.S.C.section 360bbb-3(b)(1), unless the authorization is terminated  or revoked sooner.       Influenza A by PCR NEGATIVE NEGATIVE Final   Influenza B by PCR NEGATIVE NEGATIVE Final    Comment: (NOTE) The Xpert Xpress SARS-CoV-2/FLU/RSV plus assay is intended as an aid in the diagnosis of influenza from Nasopharyngeal swab  specimens and should not be used as a sole basis for treatment. Nasal washings and aspirates are unacceptable for Xpert Xpress SARS-CoV-2/FLU/RSV testing.  Fact Sheet for Patients: EntrepreneurPulse.com.au  Fact Sheet for Healthcare Providers: IncredibleEmployment.be  This test is not yet approved or cleared by the [place] FDA and has been authorized for detection and/or diagnosis of SARS-CoV-2 by FDA under an Emergency Use Authorization (EUA). This EUA will remain in effect (meaning this test can be used) for the duration of the COVID-19 declaration under Section 564(b)(1) of the Act, 21 U.S.C. section 360bbb-3(b)(1), unless the authorization is terminated or revoked.  Performed at [facility], [address] [place], [place] [address]          Radiology Studies: DG Chest 2 View  Result Date: 02/16/2020 CLINICAL DATA:  Shortness of breath EXAM: CHEST - 2 VIEW COMPARISON:  February 07, 2020 FINDINGS: There is interstitial thickening bilaterally with areas of scarring in the left mid lung. No  evident consolidation or well-defined airspace opacity. Heart is mildly enlarged with pulmonary vascularity normal. No adenopathy. No bone lesions. IMPRESSION: Scarring left mid lung. Areas of interstitial thickening, likely due to chronic bronchitis. No frank edema or airspace opacity evident. Stable cardiac prominence. Electronically Signed   By: Lowella Grip III M.D.   On: 02/16/2020 18:15   CT Angio Abd/Pel W and/or Wo Contrast  Result Date: 02/17/2020 CLINICAL DATA:  Bright red blood per rectum, dark blood in stool for 4 days, anticoagulated, history of breast cancer EXAM: CTA ABDOMEN AND PELVIS WITHOUT AND WITH CONTRAST TECHNIQUE: Multidetector CT imaging of the abdomen and pelvis was performed using the standard protocol during bolus administration of intravenous contrast. Multiplanar reconstructed images and MIPs were obtained and reviewed to evaluate the vascular anatomy. CONTRAST:  118m OMNIPAQUE IOHEXOL 350 MG/ML SOLN COMPARISON:  05/20/2017 FINDINGS: VASCULAR Aorta: Normal caliber aorta without aneurysm, dissection, vasculitis or significant stenosis. Celiac: Patent without evidence of aneurysm, dissection, vasculitis or significant stenosis. SMA: Patent without evidence of aneurysm, dissection, vasculitis or significant stenosis. Renals: There are 3 renal arteries seen bilaterally. Vessels are patent without evidence of aneurysm, dissection, or significant stenosis. IMA: Patent without evidence of aneurysm, dissection, vasculitis or significant stenosis. Inflow: Patent without evidence of aneurysm, dissection, vasculitis or significant stenosis. Proximal Outflow: Bilateral common femoral and visualized portions of the superficial and profunda femoral arteries are patent without evidence of aneurysm, dissection, vasculitis or significant stenosis. Veins: No obvious venous abnormality within the limitations of this arterial phase study. Review of the MIP images confirms the above findings.  NON-VASCULAR Lower chest: Right pleural effusion volume estimated less than 1 L. Hypoventilatory changes at the lung bases. Hepatobiliary: Mild nodularity of the liver capsule suggests cirrhosis. No focal liver abnormalities. The gallbladder is surgically absent. Pancreas: Unremarkable. No pancreatic ductal dilatation or surrounding inflammatory changes. Spleen: Spleen is enlarged measuring 16.2 cm in craniocaudal length. No focal abnormalities. Adrenals/Urinary Tract: Adrenal glands are unremarkable. Kidneys are normal, without renal calculi, focal lesion, or hydronephrosis. Bladder is unremarkable. Stomach/Bowel: No bowel obstruction or ileus. No bowel wall thickening or inflammatory change. Scattered diverticulosis of the colon without diverticulitis. There is no accumulation of contrast within the bowel lumen to suggest active gastrointestinal hemorrhage. Lymphatic: Multiple borderline enlarged retroperitoneal lymph nodes are identified, largest measuring 12 mm in the left para-aortic region. These are nonspecific. Reproductive: Status post hysterectomy. No adnexal masses. Other: Small volume ascites. No free intraperitoneal gas. No abdominal wall hernia. Musculoskeletal: No acute or destructive bony lesions.  Reconstructed images demonstrate no additional findings. IMPRESSION: 1. No evidence of active gastrointestinal bleeding. 2. Right pleural effusion, volume estimated less than 1 L. 3. 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 4. Diverticulosis without diverticulitis. 5. Borderline enlarged retroperitoneal lymph nodes, likely reactive. 6.  Aortic Atherosclerosis (ICD10-I70.0). Electronically Signed   By: Randa Ngo M.D.   On: 02/17/2020 03:23        Scheduled Meds: . amLODipine  5 mg Oral Daily  . dicyclomine  10 mg Oral QHS  . escitalopram  5 mg Oral QHS  . fenofibrate  160 mg Oral QHS  . gabapentin  300 mg Oral QHS  . Hyoscyamine Sulfate  0.375 mg  Oral BID  . insulin aspart  0-15 Units Subcutaneous TID WC  . insulin aspart  0-5 Units Subcutaneous QHS  . insulin glargine  20 Units Subcutaneous Daily  . [START ON 02/18/2020] levothyroxine  75 mcg Oral QAC breakfast  . polyethylene glycol-electrolytes  4,000 mL Oral Once  . potassium chloride SA  20 mEq Oral BID  . simvastatin  20 mg Oral QHS   Continuous Infusions: . pantoprozole (PROTONIX) infusion 8 mg/hr (02/17/20 1240)     LOS: 0 days    Time spent: 15 minutes    Edwin Dada, MD [facility] 02/17/2020, 2:51 PM     Please page though [place] or Epic secure chat:  For password, contact charge nurse

## 2020-02-18 ENCOUNTER — Inpatient Hospital Stay: Payer: Medicare Other

## 2020-02-18 ENCOUNTER — Ambulatory Visit: Payer: Medicare PPO | Admitting: Physician Assistant

## 2020-02-18 ENCOUNTER — Encounter
Admission: EM | Disposition: A | Discharge status: Home / Self Care | Payer: Self-pay | Source: Home / Self Care | Attending: Family Medicine

## 2020-02-18 ENCOUNTER — Inpatient Hospital Stay: Payer: Medicare Other | Admitting: Certified Registered"

## 2020-02-18 ENCOUNTER — Encounter: Payer: Self-pay | Admitting: Family Medicine

## 2020-02-18 DIAGNOSIS — R188 Other ascites: Secondary | ICD-10-CM | POA: Diagnosis not present

## 2020-02-18 DIAGNOSIS — K635 Polyp of colon: Secondary | ICD-10-CM

## 2020-02-18 DIAGNOSIS — K922 Gastrointestinal hemorrhage, unspecified: Secondary | ICD-10-CM | POA: Diagnosis not present

## 2020-02-18 DIAGNOSIS — I480 Paroxysmal atrial fibrillation: Secondary | ICD-10-CM | POA: Diagnosis not present

## 2020-02-18 DIAGNOSIS — R140 Abdominal distension (gaseous): Secondary | ICD-10-CM | POA: Diagnosis not present

## 2020-02-18 DIAGNOSIS — K921 Melena: Secondary | ICD-10-CM | POA: Diagnosis not present

## 2020-02-18 DIAGNOSIS — K529 Noninfective gastroenteritis and colitis, unspecified: Secondary | ICD-10-CM | POA: Diagnosis not present

## 2020-02-18 HISTORY — PX: COLONOSCOPY WITH PROPOFOL: SHX5780

## 2020-02-18 HISTORY — PX: ESOPHAGOGASTRODUODENOSCOPY (EGD) WITH PROPOFOL: SHX5813

## 2020-02-18 LAB — GLUCOSE, CAPILLARY
Glucose-Capillary: 108 mg/dL — ABNORMAL HIGH (ref 70–99)
Glucose-Capillary: 116 mg/dL — ABNORMAL HIGH (ref 70–99)
Glucose-Capillary: 104 mg/dL — ABNORMAL HIGH (ref 70–99)

## 2020-02-18 LAB — CBC
HCT: 29.4 % — ABNORMAL LOW (ref 36.0–46.0)
Hemoglobin: 9.7 g/dL — ABNORMAL LOW (ref 12.0–15.0)
MCH: 30.7 pg (ref 26.0–34.0)
MCHC: 33 g/dL (ref 30.0–36.0)
MCV: 93 fL (ref 80.0–100.0)
Platelets: 80 10*3/uL — ABNORMAL LOW (ref 150–400)
RBC: 3.16 MIL/uL — ABNORMAL LOW (ref 3.87–5.11)
RDW: 14 % (ref 11.5–15.5)
WBC: 1.8 10*3/uL — ABNORMAL LOW (ref 4.0–10.5)
nRBC: 0 % (ref 0.0–0.2)

## 2020-02-18 LAB — COMPREHENSIVE METABOLIC PANEL
ALT: 16 U/L (ref 0–44)
AST: 26 U/L (ref 15–41)
Albumin: 3.3 g/dL — ABNORMAL LOW (ref 3.5–5.0)
Alkaline Phosphatase: 32 U/L — ABNORMAL LOW (ref 38–126)
Anion gap: 7 (ref 5–15)
BUN: 14 mg/dL (ref 8–23)
CO2: 25 mmol/L (ref 22–32)
Calcium: 8.4 mg/dL — ABNORMAL LOW (ref 8.9–10.3)
Chloride: 106 mmol/L (ref 98–111)
Creatinine, Ser: 0.79 mg/dL (ref 0.44–1.00)
GFR, Estimated: >60 mL/min (ref >60)
Glucose, Bld: 104 mg/dL — ABNORMAL HIGH (ref 70–99)
Potassium: 3.7 mmol/L (ref 3.5–5.1)
Sodium: 138 mmol/L (ref 135–145)
Total Bilirubin: 1 mg/dL (ref 0.3–1.2)
Total Protein: 7 g/dL (ref 6.5–8.1)

## 2020-02-18 SURGERY — ESOPHAGOGASTRODUODENOSCOPY (EGD) WITH PROPOFOL
Anesthesia: General

## 2020-02-18 MED ORDER — SPIRONOLACTONE 25 MG PO TABS: 25.0000 mg | ORAL_TABLET | Freq: Every day | ORAL | 3 refills | Status: DC

## 2020-02-18 MED ORDER — PROPOFOL 500 MG/50ML IV EMUL
INTRAVENOUS | Status: DC | PRN
  Administered 2020-02-18: 145 ug/kg/min via INTRAVENOUS

## 2020-02-18 MED ORDER — METOPROLOL TARTRATE 25 MG PO TABS
25.0000 mg | ORAL_TABLET | Freq: Two times a day (BID) | ORAL | Status: DC
Start: 2020-02-18 — End: 2020-02-18

## 2020-02-18 MED ORDER — PROPOFOL 10 MG/ML IV BOLUS
INTRAVENOUS | Status: DC | PRN
  Administered 2020-02-18: 10 mg via INTRAVENOUS
  Administered 2020-02-18: 45 mg via INTRAVENOUS

## 2020-02-18 MED ORDER — SODIUM CHLORIDE 0.9 % IV SOLN: INTRAVENOUS | Status: DC | PRN

## 2020-02-18 MED ORDER — LIDOCAINE HCL (CARDIAC) PF 100 MG/5ML IV SOSY
PREFILLED_SYRINGE | INTRAVENOUS | Status: DC | PRN
  Administered 2020-02-18: 100 mg via INTRAVENOUS

## 2020-02-18 MED ORDER — GLYCOPYRROLATE 0.2 MG/ML IJ SOLN
INTRAMUSCULAR | Status: DC | PRN
  Administered 2020-02-18: .2 mg via INTRAVENOUS

## 2020-02-18 MED ORDER — SPIRONOLACTONE 25 MG PO TABS: 25.0000 mg | ORAL_TABLET | Freq: Every day | ORAL | Status: DC

## 2020-02-18 NOTE — Op Note (Signed)
[facility] Patient Name: Annette Foster Procedure Date: 02/18/2020 1:24 PM MRN: [id number] Account #: [id number] Date of Birth: 04-04-1940 Admit Type: Outpatient Age: 80 Room: [facility] ENDO ROOM 4 Gender: Female Note Status: Finalized Procedure:             Upper GI endoscopy Indications:           Hematochezia Providers:             [name] MD, MD Referring MD:          Leona Carry. Hall Busing, MD (Referring MD) Medicines:             Propofol per Anesthesia Complications:         No immediate complications. Procedure:             Pre-Anesthesia Assessment:                        - Prior to the procedure, a History and Physical was                         performed, and patient medications and allergies were                         reviewed. The patient's tolerance of previous                         anesthesia was also reviewed. The risks and benefits                         of the procedure and the sedation options and risks                         were discussed with the patient. All questions were                         answered, and informed consent was obtained. Prior                         Anticoagulants: The patient has taken no previous                         anticoagulant or antiplatelet agents. ASA Grade                         Assessment: II - A patient with mild systemic disease.                         After reviewing the risks and benefits, the patient                         was deemed in satisfactory condition to undergo the                         procedure.                        After obtaining informed consent, the endoscope was  passed under direct vision. Throughout the procedure,                         the patient's blood pressure, pulse, and oxygen                         saturations were monitored continuously. The Endoscope                         was introduced through the mouth, and advanced to the                          second part of duodenum. The upper GI endoscopy was                         accomplished without difficulty. The patient tolerated                         the procedure well. Findings:      Grade II varices were found in the lower third of the esophagus.      Moderate portal hypertensive gastropathy was found in the entire       examined stomach.      The examined duodenum was normal. Impression:            - Grade II esophageal varices.                        - Portal hypertensive gastropathy.                        - Normal examined duodenum.                        - No specimens collected. Recommendation:        - Return patient to hospital ward for ongoing care.                        - Resume previous diet.                        - Continue present medications.                        - Perform a colonoscopy today. Procedure Code(s):     --- Professional ---                        [phone], Esophagogastroduodenoscopy, flexible,                         transoral; diagnostic, including collection of                         specimen(s) by brushing or washing, when performed                         (separate procedure) Diagnosis Code(s):     --- Professional ---                        K92.1, Melena (includes Hematochezia)  K31.89, Other diseases of stomach and duodenum                        I85.00, Esophageal varices without bleeding                        K76.6, Portal hypertension CPT copyright 2019 American [employer] Association. All rights reserved. The codes documented in this report are preliminary and upon coder review may  be revised to meet current compliance requirements. [name] MD, MD 02/18/2020 1:36:13 PM This report has been signed electronically. Number of Addenda: 0 Note Initiated On: 02/18/2020 1:24 PM Estimated Blood Loss:  Estimated blood loss: none.      [facility]

## 2020-02-18 NOTE — Progress Notes (Addendum)
Progress Note  Patient Name: Annette Foster Date of Encounter: 02/18/2020  [facility] Cardiologist: Nelva Bush, MD   Subjective   She reports not much sleep last night, started prep late, continues to have dark stools concerning for GI bleed Scheduled for EGD colonoscopy today Results reviewed, discussed with primary medical team Hemoglobin 9.7  Inpatient Medications    Scheduled Meds: . amLODipine  5 mg Oral Daily  . dicyclomine  10 mg Oral QHS  . escitalopram  5 mg Oral QHS  . fenofibrate  160 mg Oral QHS  . gabapentin  300 mg Oral QHS  . hyoscyamine  0.375 mg Oral BID  . insulin aspart  0-15 Units Subcutaneous TID WC  . insulin aspart  0-5 Units Subcutaneous QHS  . insulin glargine  20 Units Subcutaneous Daily  . levothyroxine  75 mcg Oral QAC breakfast  . potassium chloride SA  20 mEq Oral BID  . simvastatin  20 mg Oral QHS  . spironolactone  25 mg Oral Daily   Continuous Infusions: . pantoprozole (PROTONIX) infusion Stopped (02/18/20 1529)   PRN Meds: acetaminophen **OR** acetaminophen, hydrALAZINE, ondansetron **OR** ondansetron (ZOFRAN) IV   Vital Signs    Vitals:   02/18/20 1413 02/18/20 1423 02/18/20 1433 02/18/20 1443  BP: (!) 132/51 (!) 143/51 126/71 137/62  Pulse: 83 68 63 (!) 59  Resp: (!) 21 20 20 19   Temp:      TempSrc:      SpO2: 95% 94% 96% 94%  Weight:      Height:        Intake/Output Summary (Last 24 hours) at 02/18/2020 1612 Last data filed at 02/18/2020 1357 Gross per 24 hour  Intake 1080 ml  Output --  Net 1080 ml   Last 3 Weights 02/18/2020 02/16/2020 02/07/2020  Weight (lbs) 179 lb 14.3 oz 180 lb 165 lb  Weight (kg) 81.6 kg 81.647 kg 74.844 kg      Telemetry    Normal sinus rhythm- Personally Reviewed  ECG     - Personally Reviewed  Physical Exam   GEN: No acute distress.   Neck: No JVD Cardiac: RRR, no murmurs, rubs, or gallops.  Respiratory: Clear to auscultation bilaterally. GI: Soft, nontender,  non-distended  MS: No edema; No deformity. Neuro:  Nonfocal  Psych: Normal affect   Labs    High Sensitivity Troponin:   Recent Labs  Lab 02/07/20 1452 02/07/20 1720 02/16/20 1724 02/16/20 2104  TROPONINIHS 14 14 15 13       Chemistry Recent Labs  Lab 02/16/20 1724 02/17/20 0127 02/17/20 1620 02/18/20 0323  NA 137  --  136 138  K 4.1  --  3.8 3.7  CL 101  --  101 106  CO2 27  --  26 25  GLUCOSE 215*  --  224* 104*  BUN 32*  --  21 14  CREATININE 1.18*  --  0.86 0.79  CALCIUM 8.5*  --  8.6* 8.4*  PROT  --  7.7  --  7.0  ALBUMIN  --  3.7  --  3.3*  AST  --  25  --  26  ALT  --  18  --  16  ALKPHOS  --  38  --  32*  BILITOT  --  0.8  --  1.0  GFRNONAA 47*  --  >60 >60  ANIONGAP 9  --  9 7     Hematology Recent Labs  Lab 02/16/20 1724 02/17/20 0534 02/18/20 0323  WBC 2.8* 2.0* 1.8*  RBC 3.56* 3.24* 3.16*  HGB 10.8* 10.0* 9.7*  HCT 34.3* 30.7* 29.4*  MCV 96.3 94.8 93.0  MCH 30.3 30.9 30.7  MCHC 31.5 32.6 33.0  RDW 14.4 14.3 14.0  PLT 119* 87* 80*    BNP Recent Labs  Lab 02/16/20 1724  BNP 377.8*     DDimer No results for input(s): DDIMER in the last 168 hours.   Radiology    DG Chest 2 View  Result Date: 02/16/2020 CLINICAL DATA:  Shortness of breath EXAM: CHEST - 2 VIEW COMPARISON:  February 07, 2020 FINDINGS: There is interstitial thickening bilaterally with areas of scarring in the left mid lung. No evident consolidation or well-defined airspace opacity. Heart is mildly enlarged with pulmonary vascularity normal. No adenopathy. No bone lesions. IMPRESSION: Scarring left mid lung. Areas of interstitial thickening, likely due to chronic bronchitis. No frank edema or airspace opacity evident. Stable cardiac prominence. Electronically Signed   By: Lowella Grip III M.D.   On: 02/16/2020 18:15   [place] ASCITES (ABDOMEN LIMITED)  Result Date: 02/18/2020 CLINICAL DATA:  Ascites, abdominal distension. EXAM: LIMITED ABDOMEN ULTRASOUND FOR ASCITES  TECHNIQUE: Limited ultrasound survey for ascites was performed in all four abdominal quadrants. COMPARISON:  02/17/2020 and prior. FINDINGS: Sonographic images were obtained of the 4 abdominal quadrants. Small volume free fluid was seen within the right upper and right lower quadrants. Incidental splenomegaly. IMPRESSION: Small volume right hemiabdomen ascites. Splenomegaly. Electronically Signed   By: Primitivo Gauze M.D.   On: 02/18/2020 11:59   CT Angio Abd/Pel W and/or Wo Contrast  Result Date: 02/17/2020 CLINICAL DATA:  Bright red blood per rectum, dark blood in stool for 4 days, anticoagulated, history of breast cancer EXAM: CTA ABDOMEN AND PELVIS WITHOUT AND WITH CONTRAST TECHNIQUE: Multidetector CT imaging of the abdomen and pelvis was performed using the standard protocol during bolus administration of intravenous contrast. Multiplanar reconstructed images and MIPs were obtained and reviewed to evaluate the vascular anatomy. CONTRAST:  126m OMNIPAQUE IOHEXOL 350 MG/ML SOLN COMPARISON:  05/20/2017 FINDINGS: VASCULAR Aorta: Normal caliber aorta without aneurysm, dissection, vasculitis or significant stenosis. Celiac: Patent without evidence of aneurysm, dissection, vasculitis or significant stenosis. SMA: Patent without evidence of aneurysm, dissection, vasculitis or significant stenosis. Renals: There are 3 renal arteries seen bilaterally. Vessels are patent without evidence of aneurysm, dissection, or significant stenosis. IMA: Patent without evidence of aneurysm, dissection, vasculitis or significant stenosis. Inflow: Patent without evidence of aneurysm, dissection, vasculitis or significant stenosis. Proximal Outflow: Bilateral common femoral and visualized portions of the superficial and profunda femoral arteries are patent without evidence of aneurysm, dissection, vasculitis or significant stenosis. Veins: No obvious venous abnormality within the limitations of this arterial phase study. Review  of the MIP images confirms the above findings. NON-VASCULAR Lower chest: Right pleural effusion volume estimated less than 1 L. Hypoventilatory changes at the lung bases. Hepatobiliary: Mild nodularity of the liver capsule suggests cirrhosis. No focal liver abnormalities. The gallbladder is surgically absent. Pancreas: Unremarkable. No pancreatic ductal dilatation or surrounding inflammatory changes. Spleen: Spleen is enlarged measuring 16.2 cm in craniocaudal length. No focal abnormalities. Adrenals/Urinary Tract: Adrenal glands are unremarkable. Kidneys are normal, without renal calculi, focal lesion, or hydronephrosis. Bladder is unremarkable. Stomach/Bowel: No bowel obstruction or ileus. No bowel wall thickening or inflammatory change. Scattered diverticulosis of the colon without diverticulitis. There is no accumulation of contrast within the bowel lumen to suggest active gastrointestinal hemorrhage. Lymphatic: Multiple borderline enlarged retroperitoneal lymph nodes are identified, largest  measuring 12 mm in the left para-aortic region. These are nonspecific. Reproductive: Status post hysterectomy. No adnexal masses. Other: Small volume ascites. No free intraperitoneal gas. No abdominal wall hernia. Musculoskeletal: No acute or destructive bony lesions. Reconstructed images demonstrate no additional findings. IMPRESSION: 1. No evidence of active gastrointestinal bleeding. 2. Right pleural effusion, volume estimated less than 1 L. 3. 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 4. Diverticulosis without diverticulitis. 5. Borderline enlarged retroperitoneal lymph nodes, likely reactive. 6.  Aortic Atherosclerosis (ICD10-I70.0). Electronically Signed   By: Randa Ngo M.D.   On: 02/17/2020 03:23    Cardiac Studies     Patient Profile     Ms. Mchan is a 80 year old woman with history of CHF, breast cancer, colitis with intermittent bleeding, insulin-dependent  diabetes, pancytopenia, cirrhosis/NASH, iron deficiency anemia, presenting with GI bleed     Assessment & Plan    Paroxysmal atrial fibrillation  Noted on hospital admission end of December 2020, started on anticoagulation at that time  Now  with GI bleed, anticoagulation held  EGD colonoscopy results reviewed, varices, portal gastropathy -Poor candidate for anticoagulation given other comorbidities including cirrhosis, anemia, colitis  Discussed with her yesterday, she does not want anticoagulation -Would resume lower dose metoprolol 25 twice daily Hold digoxin given concern for reaction, had a puffy face Metoprolol 50 causing bradycardia   GI bleed,  Seen by GI, scope today with portal gastropathy, varices Cirrhosis -Off Eliquis   Colitis /ascites/cirrhosis Presenting with GI bleed  Also with distended abdomen, tender Need diagnostic paracentesis?   Abdominal distention  Small amount ascites on CT, exam appears out of proportion to CT scan findings  She does have pleural effusion  contributing to some shortness of breath On spironolactone Could also consider Lasix 20 daily with close outpatient follow-up   Bradycardia  Metoprolol 25 twice daily  Pancytopenia  Stable but depressed numbers  In setting of cirrhosis   Total encounter time more than 25 minutes  Greater than 50% was spent in counseling and coordination of care with the patient   [facility] will sign off.   Medication Recommendations: Spironolactone, Lasix, metoprolol 25 twice daily Other recommendations (labs, testing, etc): No further cardiac testing Follow up as an outpatient: Outpatient cardiology clinic with Dr. Saunders Revel   For questions or updates, please contact [place] Please consult [email or web address] for contact info under        Signed, Ida Rogue, MD  02/18/2020, 4:12 PM

## 2020-02-18 NOTE — Anesthesia Postprocedure Evaluation (Deleted)
Anesthesia Post Note  Patient: Annette Foster  Procedure(s) Performed: ESOPHAGOGASTRODUODENOSCOPY (EGD) WITH PROPOFOL (N/A ) COLONOSCOPY WITH PROPOFOL (N/A )  Anesthesia Type: General Level of consciousness: patient uncooperative Anesthetic complications: no   No complications documented.   Last Vitals:  Vitals:   02/18/20 1307 02/18/20 1403  BP: (!) 171/55 (!) 135/55  Pulse: 61 90  Resp: 16 (!) 22  Temp: 36.5 C   SpO2: 96% 94%    Last Pain:  Vitals:   02/18/20 1403  TempSrc:   PainSc: Asleep                 Abrie Egloff [place]

## 2020-02-18 NOTE — Op Note (Signed)
[facility] Patient Name: Annette Foster Procedure Date: 02/18/2020 1:23 PM MRN: [id number] Account #: [id number] Date of Birth: July 26, 1940 Admit Type: Outpatient Age: 80 Room: [facility] ENDO ROOM 4 Gender: Female Note Status: Finalized Procedure:             Colonoscopy Indications:           Hematochezia Providers:             [name] MD, MD Referring MD:          Leona Carry. Hall Busing, MD (Referring MD) Medicines:             Propofol per Anesthesia Complications:         No immediate complications. Procedure:             Pre-Anesthesia Assessment:                        - Prior to the procedure, a History and Physical was                         performed, and patient medications and allergies were                         reviewed. The patient's tolerance of previous                         anesthesia was also reviewed. The risks and benefits                         of the procedure and the sedation options and risks                         were discussed with the patient. All questions were                         answered, and informed consent was obtained. Prior                         Anticoagulants: The patient has taken no previous                         anticoagulant or antiplatelet agents. ASA Grade                         Assessment: III - A patient with severe systemic                         disease. After reviewing the risks and benefits, the                         patient was deemed in satisfactory condition to                         undergo the procedure.                        After obtaining informed consent, the colonoscope was  passed under direct vision. Throughout the procedure,                         the patient's blood pressure, pulse, and oxygen                         saturations were monitored continuously. The                         Colonoscope was introduced through the anus and                          advanced to the the cecum, identified by appendiceal                         orifice and ileocecal valve. The colonoscopy was                         performed without difficulty. The patient tolerated                         the procedure well. The quality of the bowel                         preparation was good. Findings:      The perianal and digital rectal examinations were normal.      Two sessile polyps were found in the transverse colon. The polyps were 3       to 4 mm in size. These polyps were removed with a cold snare. Resection       and retrieval were complete. For hemostasis, one hemostatic clip was       successfully placed (MR conditional). There was no bleeding at the end       of the procedure.      A diffuse area of severely friable mucosa with contact bleeding was       found in the entire colon.      Non-bleeding internal hemorrhoids were found during retroflexion. The       hemorrhoids were Grade II (internal hemorrhoids that prolapse but reduce       spontaneously). Impression:            - Two 3 to 4 mm polyps in the transverse colon,                         removed with a cold snare. Resected and retrieved.                         Clip (MR conditional) was placed.                        - Friability with contact bleeding in the entire                         examined colon.                        - Non-bleeding internal hemorrhoids. Recommendation:        - Return patient to hospital ward for ongoing care.                        -  Resume previous diet.                        - Continue present medications.                        - Await pathology results. Procedure Code(s):     --- Professional ---                        [id number], Colonoscopy, flexible; with removal of                         tumor(s), polyp(s), or other lesion(s) by snare                         technique Diagnosis Code(s):     --- Professional ---                        K92.1, Melena  (includes Hematochezia)                        K63.5, Polyp of colon CPT copyright 2019 [employer]. All rights reserved. The codes documented in this report are preliminary and upon coder review may  be revised to meet current compliance requirements. [name] MD, MD 02/18/2020 2:01:21 PM This report has been signed electronically. Number of Addenda: 0 Note Initiated On: 02/18/2020 1:23 PM Scope Withdrawal Time: 0 hours 6 minutes 32 seconds  Total Procedure Duration: 0 hours 21 minutes 41 seconds  Estimated Blood Loss:  Estimated blood loss: none.      [facility]

## 2020-02-18 NOTE — Discharge Summary (Signed)
Physician Discharge Summary  Samia Kukla XAJ:[id number] DOB: February 14, 1940 DOA: 02/17/2020  PCP: Albina Billet, MD  Admit date: 02/17/2020 Discharge date: 02/18/2020  Admitted From: Home  Disposition:  Home   Recommendations for Outpatient Follow-up:  1. Follow up with PCP Dr. Hall Busing in 1 week 2. Dr. Hall Busing: 1. Please obtain BMP/CBC in one week 2. Check K and adjust spironolactone and furosemide 3. Evaluate BP on new amlodipine   4. Assess response to added spironolactone; augment diuresis if needed and repeat 2V CXR after diuresis to eval right pleural effusion  3. Follow up with gastroenterologist in 2-4 weeks        Home Health: None  Equipment/Devices: None new  Discharge Condition: Fair  CODE STATUS: FULL Diet recommendation: Low sodium  Brief/Interim Summary: Mrs. Ritacco is an 80 y.o. F with obesity, DM, NASH cirrhosis compensated, hypothyroidism, recently diagnosed atrial fibrillation, collagenous colitis, OSA on CPAP, GI bleed and iron deficiency anemia who presented with rectal bleeding.  Patient recently presented with palpitations 10 days ago, found to have new onset A. fib with RVR.  Cardiology was consulted, who recommended heparin challenge given her history of GI bleeding.  She had no bleeding, and her rate was controlled with digoxin and metoprolol and she was discharged on apixaban.  She was okay for about a week, then around 4 days ago she had a large blood bowel movement.  She continued to have smaller red bloody bowel movements over the subsequent days, without dizziness, dyspnea, or palpitations.  Apparently yesterday morning she had a "dark" BM so she came to the ER.  In the ER, Hgb 10.8 g/dL from previous baseline 11 g/dL at last discharge.  Started on Protonix and admitted.          PRINCIPAL HOSPITAL DIAGNOSIS: Acute lower GI bleed    Discharge Diagnoses:   Acute lower GI bleed Patient admitted and Eliquis held.  Hgb drifted down  slightly to 10 g/dL at discharge.  Patient BM cleared without blood.    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  Patient's Eliquis was stopped permanently after discussion with GI and Cardiology.    Thrombocytopenia NASH cirrhosis Esophageal varices Patient with NASH cirrhosis.  Has no history of encephalopathy but mild ascites on exam now, viewed with ultrasound today.   Continue furosemide.  Add spironolactone.  Low sodium diet.  Likely hepatic pleural effusion Will augment diuresis with spironolactone.  If not improved at PCP, would increase furosemide or order outpatient thoracentesis.  Follow up with Hepatologist/gastroenterologist.    Acute blood loss anemia Hgb 9.7 g/dL today.  Bleeding has clinically stopped after stopping Eliquis.    Diabetes Continue home metformin, glimepiride, semaglutide, Degludec  Paroxysmal atrial fibrillation Bradycardic and in sinus rhythm here.  Cardiology were consulted, recommended stopping digoxin and Eliquis, resuming metoprolol when HR normalized.  HR in 80s and 90s on day of discharge, resumed metoprolol at low dose.      Hypertension Hypertensive here.  Added spironolactone and amlodipine low dose.    Stop valsartan.    Hypothyroidism Continue levothyroxine  AKI Cr baseline is 0.8, currently is >1.1 mg/dL, with elevated BUN:Cr ratio.  Resolved by the time of dischage  IBS Continue dicyclomine, Lexapro, hyoscyamine               Discharge Instructions  Discharge Instructions    Diet -  low sodium heart healthy   Complete by: As directed    Discharge instructions   Complete by: As directed    From Dr. Loleta Books: You were admitted with rectal bleeding  It appears this was from colitis, which is a nonspecific term for irritation of the colon.   From  Dr. Dorothey Baseman testing, it is not fully clear what caused the colitis, but should resolve by itself.  On your endoscopy though, we did see that you have varices in your esophagus, and other cirrhosis related changes to your stomach that are HIGH risk for bleeding. Given that HIGH risk, Dr. Rockey Situ, Dr. Allen Norris and I agree that you should stop Eliquis.  It's not worth the risk of bleeding to take this for stroke prevention in your case.   STOP apixaban/Eliquis  For you stomach sewlling: the ultrasound showed very little ascites (or cirrhosis related fluid) You should keep taking your daily furosemide Add spironolactone 25 mg once daily  Go see your primary care doctor in 1 week for labs check  Go see your gastroenterologist as soon as they can get you in  For your blood pressure and heart rate:  Keep taking low dose metoprolol 25 mg twice daily STOP taking valsartan   Increase activity slowly   Complete by: As directed      Allergies as of 02/18/2020      Reactions   Codeine Itching, Nausea And Vomiting   Demeclocycline Rash   Demerol [meperidine] Itching, Nausea And Vomiting   Hydrocodone Itching, Nausea And Vomiting   Oxycodone Itching, Nausea And Vomiting   Pentazocine Itching, Nausea And Vomiting   Tetracyclines & Related Rash   Fentanyl Itching, Nausea And Vomiting   D/W surgical nurse on 7/23 and pt reportedly tolerated dose during surgery.  Dose was given by MD.  Pt reported that she only gets itching and did not object to receiving med      Medication List    STOP taking these medications   apixaban 5 MG Tabs tablet Commonly known as: ELIQUIS   valsartan 160 MG tablet Commonly known as: DIOVAN     TAKE these medications   benzonatate 200 MG capsule Commonly known as: TESSALON Take 200 mg by mouth 3 (three) times daily as needed for cough.   clobetasol ointment 0.05 % Commonly known as: TEMOVATE Apply 1 application topically 2 (two) times a week.   cyanocobalamin  1000 MCG/ML injection Commonly known as: (VITAMIN B-12) 1,000 mcg every 30 (thirty) days.   dicyclomine 10 MG capsule Commonly known as: BENTYL Take 10 mg by mouth at bedtime.   docusate sodium 100 MG capsule Commonly known as: COLACE Take 1 capsule by mouth 2 (two) times daily as needed.   EPINEPHrine 0.3 mg/0.3 mL Soaj injection Commonly known as: EPI-PEN   escitalopram 5 MG tablet Commonly known as: LEXAPRO Take 5 mg by mouth at bedtime.   esomeprazole 40 MG capsule Commonly known as: NEXIUM Take 40 mg by mouth 2 (two) times daily before a meal.   estradiol 0.1 MG/GM vaginal cream Commonly known as: ESTRACE Place 8.36 Applicatorfuls vaginally 2 (two) times a week.   fenofibrate 160 MG tablet Take 160 mg by mouth at bedtime.   fluticasone 50 MCG/ACT nasal spray Commonly known as: FLONASE Place 1 spray into the nose daily as needed for allergies.   furosemide 20 MG tablet Commonly known as: LASIX Take 20-40 mg by mouth See admin instructions. Take once a day. alternate taking 28m and 464m  gabapentin 300 MG capsule Commonly known as: NEURONTIN Take 300 mg by mouth at bedtime.   glimepiride 2 MG tablet Commonly known as: AMARYL Take 2 mg by mouth at bedtime.   Hyoscyamine Sulfate 0.375 MG Tbcr Take 0.375 mg by mouth 2 (two) times daily.   levothyroxine 75 MCG tablet Commonly known as: SYNTHROID Take 75 mcg by mouth daily before breakfast.   magnesium oxide 400 MG tablet Commonly known as: MAG-OX Take 400 mg by mouth 2 (two) times daily.   metFORMIN 750 MG 24 hr tablet Commonly known as: GLUCOPHAGE-XR Take 750 mg by mouth daily with breakfast.   metoprolol tartrate 25 MG tablet Commonly known as: LOPRESSOR Take 1 tablet (25 mg total) by mouth 2 (two) times daily.   insulin aspart 100 UNIT/ML FlexPen Commonly known as: NOVOLOG Inject 15 Units into the skin 3 (three) times daily with meals.   NovoLOG FlexPen 100 UNIT/ML FlexPen Generic drug:  insulin aspart Based on sliding scale   ondansetron 4 MG tablet Commonly known as: ZOFRAN Take 4 mg by mouth every 8 (eight) hours as needed for nausea or vomiting.   potassium chloride SA 20 MEQ tablet Commonly known as: KLOR-CON Take 20 mEq by mouth 2 (two) times daily.   saccharomyces boulardii 250 MG capsule Commonly known as: FLORASTOR Take 250 mg by mouth 2 (two) times daily.   Semaglutide(0.25 or 0.5MG/DOS) 2 MG/1.5ML Sopn Inject 0.5 mg into the skin once a week. On Sunday.   simvastatin 20 MG tablet Commonly known as: ZOCOR Take 20 mg by mouth at bedtime.   spironolactone 25 MG tablet Commonly known as: ALDACTONE Take 1 tablet (25 mg total) by mouth daily.   Tresiba 100 UNIT/ML Soln Generic drug: Insulin Degludec Inject 50 mg into the skin at bedtime.   valACYclovir 500 MG tablet Commonly known as: VALTREX Take 500 mg by mouth See admin instructions. For fever blisters take 57m twice a day as needed   Vitamin D 50 MCG (2000 UT) Caps Take 2,000 Units by mouth daily.       Follow-up Information    TAlbina Billet MD. Schedule an appointment as soon as possible for a visit in 1 week(s).   Specialty: Internal Medicine Contact information: [address]   [place] [phone]       ENelva Bush MD .   Specialty: Cardiology Contact information: [address] [place] [phone]             Allergies  Allergen Reactions  . Codeine Itching and Nausea And Vomiting  . Demeclocycline Rash  . Demerol [Meperidine] Itching and Nausea And Vomiting  . Hydrocodone Itching and Nausea And Vomiting  . Oxycodone Itching and Nausea And Vomiting  . Pentazocine Itching and Nausea And Vomiting  . Tetracyclines & Related Rash  . Fentanyl Itching and Nausea And Vomiting    D/W surgical nurse on 7/23 and pt reportedly tolerated dose during surgery.  Dose was given by MD.  Pt reported that she only gets  itching and did not object to receiving med    Consultations:  Gastroenterology, Dr. WAllen Norris Cardiology, Dr. GRockey Situ  Procedures/Studies: DG Chest 1 View  Result Date: 02/07/2020 CLINICAL DATA:  Chest pain. EXAM: CHEST  1 VIEW COMPARISON:  February 19, 2016. FINDINGS: Stable cardiomediastinal silhouette. No pneumothorax or pleural effusion is noted. Mild central pulmonary vascular congestion is noted. Bilateral interstitial densities are noted concerning for edema  or possibly atypical inflammation. Bony thorax is unremarkable. IMPRESSION: Mild central pulmonary vascular congestion. Bilateral interstitial densities are noted concerning for edema or possibly atypical inflammation. Electronically Signed   By: Marijo Conception M.D.   On: 02/07/2020 15:28   DG Chest 2 View  Result Date: 02/16/2020 CLINICAL DATA:  Shortness of breath EXAM: CHEST - 2 VIEW COMPARISON:  February 07, 2020 FINDINGS: There is interstitial thickening bilaterally with areas of scarring in the left mid lung. No evident consolidation or well-defined airspace opacity. Heart is mildly enlarged with pulmonary vascularity normal. No adenopathy. No bone lesions. IMPRESSION: Scarring left mid lung. Areas of interstitial thickening, likely due to chronic bronchitis. No frank edema or airspace opacity evident. Stable cardiac prominence. Electronically Signed   By: Lowella Grip III M.D.   On: 02/16/2020 18:15   MM DIAG BREAST TOMO BILATERAL  Result Date: 01/31/2020 CLINICAL DATA:  History of a left lumpectomy for breast carcinoma performed in 2017. Patient has had 2 subsequent benign biopsies in the lumpectomy bed. Patient was treated with adjuvant radiation therapy. EXAM: DIGITAL DIAGNOSTIC BILATERAL MAMMOGRAM WITH TOMO AND CAD COMPARISON:  Previous exam(s). ACR Breast Density Category c: The breast tissue is heterogeneously dense, which may obscure small masses. FINDINGS: Post lumpectomy changes on the left are stable from the most  recent prior exam. There are no masses or areas of nonsurgical architectural distortion. There are no suspicious calcifications. Mammographic images were processed with CAD. IMPRESSION: 1. No evidence of new or recurrent breast carcinoma. 2. Benign post lumpectomy changes on the left. RECOMMENDATION: Diagnostic mammogram in 1 year per standard post lumpectomy protocol. (Code:DM-B-01Y) I have discussed the findings and recommendations with the patient. If applicable, a reminder letter will be sent to the patient regarding the next appointment. BI-RADS CATEGORY  2: Benign. Electronically Signed   By: Lajean Manes M.D.   On: 01/31/2020 10:14   ECHOCARDIOGRAM COMPLETE  Result Date: 02/08/2020    ECHOCARDIOGRAM REPORT   Patient Name:   LINSI HUMANN Date of Exam: 02/08/2020 Medical Rec #:  [id number]           Height:       63.0 in Accession #:    [id number]          Weight:       165.0 lb Date of Birth:  23-Jan-1941           BSA:          1.782 m Patient Age:    80 years            BP:           107/47 mmHg Patient Gender: F                   HR:           78 bpm. Exam Location:  [facility] Procedure: 2D Echo, Cardiac Doppler and Color Doppler Indications:     Atrial Fibrillation 148.91  History:         Patient has no prior history of Echocardiogram examinations.                  CHF; Risk Factors:Hypertension and Diabetes.  Sonographer:     Sherrie Sport RDCS (AE) Referring Phys:  [id number] Rise Mu Diagnosing Phys: Nelva Bush MD  Sonographer Comments: Technically challenging study due to limited acoustic windows and suboptimal apical window. IMPRESSIONS  1. Left ventricular ejection fraction, by estimation, is 50 to 55%.  The left ventricle has low normal function. Left ventricular endocardial border not optimally defined to evaluate regional wall motion. There is mild left ventricular hypertrophy. Left ventricular diastolic parameters are indeterminate.  2. Right ventricular systolic function is mildly  reduced. The right ventricular size is normal. Mildly increased right ventricular wall thickness. There is moderately elevated pulmonary artery systolic pressure.  3. Left atrial size was moderately dilated.  4. Right atrial size was mildly dilated.  5. The pericardial effusion is posterior to the left ventricle.  6. The mitral valve is abnormal. Trivial mitral valve regurgitation.  7. Tricuspid valve regurgitation is mild to moderate.  8. The aortic valve is tricuspid. There is mild calcification of the aortic valve. There is mild thickening of the aortic valve. Aortic valve regurgitation is not visualized. Mild to moderate aortic valve sclerosis/calcification is present, without any evidence of aortic stenosis.  9. There is mild dilatation of the ascending aorta, measuring 36 mm. 10. The inferior vena cava is dilated in size with <50% respiratory variability, suggesting right atrial pressure of 15 mmHg. FINDINGS  Left Ventricle: Left ventricular ejection fraction, by estimation, is 50 to 55%. The left ventricle has low normal function. Left ventricular endocardial border not optimally defined to evaluate regional wall motion. The left ventricular internal cavity  size was normal in size. There is mild left ventricular hypertrophy. Left ventricular diastolic parameters are indeterminate. Right Ventricle: The right ventricular size is normal. Mildly increased right ventricular wall thickness. Right ventricular systolic function is mildly reduced. There is moderately elevated pulmonary artery systolic pressure. The tricuspid regurgitant velocity is 2.95 m/s, and with an assumed right atrial pressure of 15 mmHg, the estimated right ventricular systolic pressure is 90.2 mmHg. Left Atrium: Left atrial size was moderately dilated. Right Atrium: Right atrial size was mildly dilated. Pericardium: Trivial pericardial effusion is present. The pericardial effusion is posterior to the left ventricle. Presence of pericardial  fat pad. Mitral Valve: The mitral valve is abnormal. There is mild calcification of the mitral valve leaflet(s). Trivial mitral valve regurgitation. Tricuspid Valve: The tricuspid valve is not well visualized. Tricuspid valve regurgitation is mild to moderate. Aortic Valve: The aortic valve is tricuspid. There is mild calcification of the aortic valve. There is mild thickening of the aortic valve. There is mild aortic valve annular calcification. Aortic valve regurgitation is not visualized. Mild to moderate aortic valve sclerosis/calcification is present, without any evidence of aortic stenosis. Aortic valve mean gradient measures 3.0 mmHg. Aortic valve peak gradient measures 5.0 mmHg. Aortic valve area, by VTI measures 2.26 cm. Pulmonic Valve: The pulmonic valve was grossly normal. Pulmonic valve regurgitation is mild. No evidence of pulmonic stenosis. Aorta: The aortic root is normal in size and structure. There is mild dilatation of the ascending aorta, measuring 36 mm. Pulmonary Artery: The pulmonary artery is not well seen. Venous: The inferior vena cava is dilated in size with less than 50% respiratory variability, suggesting right atrial pressure of 15 mmHg. IAS/Shunts: The interatrial septum was not well visualized.  LEFT VENTRICLE PLAX 2D LVIDd:         3.82 cm  Diastology LVIDs:         2.63 cm  LV e' medial:    5.44 cm/s LV PW:         1.27 cm  LV E/e' medial:  20.4 LV IVS:        1.01 cm  LV e' lateral:   6.09 cm/s LVOT diam:     2.00  cm  LV E/e' lateral: 18.2 LV SV:         40 LV SV Index:   22 LVOT Area:     3.14 cm  RIGHT VENTRICLE RV Basal diam:  3.92 cm RV S prime:     8.27 cm/s TAPSE (M-mode): 1.8 cm LEFT ATRIUM            Index       RIGHT ATRIUM           Index LA diam:      4.20 cm  2.36 cm/m  RA Area:     20.50 cm LA Vol (A2C): 17.6 ml  9.88 ml/m  RA Volume:   52.50 ml  29.46 ml/m LA Vol (A4C): 105.0 ml 58.93 ml/m  AORTIC VALVE                   PULMONIC VALVE AV Area (Vmax):    1.88  cm    PV Vmax:        0.72 m/s AV Area (Vmean):   1.80 cm    PV Peak grad:   2.1 mmHg AV Area (VTI):     2.26 cm    RVOT Peak grad: 2 mmHg AV Vmax:           111.50 cm/s AV Vmean:          79.600 cm/s AV VTI:            0.176 m AV Peak Grad:      5.0 mmHg AV Mean Grad:      3.0 mmHg LVOT Vmax:         66.60 cm/s LVOT Vmean:        45.500 cm/s LVOT VTI:          0.127 m LVOT/AV VTI ratio: 0.72  AORTA Ao Root diam: 3.43 cm MITRAL VALVE                TRICUSPID VALVE MV Area (PHT): 9.03 cm     TR Peak grad:   34.8 mmHg MV Decel Time: 84 msec      TR Vmax:        295.00 cm/s MV E velocity: 111.00 cm/s MV A velocity: 138.00 cm/s  SHUNTS MV E/A ratio:  0.80         Systemic VTI:  0.13 m                             Systemic Diam: 2.00 cm Nelva Bush MD Electronically signed by Nelva Bush MD Signature Date/Time: 02/08/2020/9:30:21 AM    Final    [place] ASCITES (ABDOMEN LIMITED)  Result Date: 02/18/2020 CLINICAL DATA:  Ascites, abdominal distension. EXAM: LIMITED ABDOMEN ULTRASOUND FOR ASCITES TECHNIQUE: Limited ultrasound survey for ascites was performed in all four abdominal quadrants. COMPARISON:  02/17/2020 and prior. FINDINGS: Sonographic images were obtained of the 4 abdominal quadrants. Small volume free fluid was seen within the right upper and right lower quadrants. Incidental splenomegaly. IMPRESSION: Small volume right hemiabdomen ascites. Splenomegaly. Electronically Signed   By: Primitivo Gauze M.D.   On: 02/18/2020 11:59   CT Angio Abd/Pel W and/or Wo Contrast  Result Date: 02/17/2020 CLINICAL DATA:  Bright red blood per rectum, dark blood in stool for 4 days, anticoagulated, history of breast cancer EXAM: CTA ABDOMEN AND PELVIS WITHOUT AND WITH CONTRAST TECHNIQUE: Multidetector CT imaging of the abdomen and pelvis was performed using the standard  protocol during bolus administration of intravenous contrast. Multiplanar reconstructed images and MIPs were obtained and reviewed to evaluate the  vascular anatomy. CONTRAST:  18m OMNIPAQUE IOHEXOL 350 MG/ML SOLN COMPARISON:  05/20/2017 FINDINGS: VASCULAR Aorta: Normal caliber aorta without aneurysm, dissection, vasculitis or significant stenosis. Celiac: Patent without evidence of aneurysm, dissection, vasculitis or significant stenosis. SMA: Patent without evidence of aneurysm, dissection, vasculitis or significant stenosis. Renals: There are 3 renal arteries seen bilaterally. Vessels are patent without evidence of aneurysm, dissection, or significant stenosis. IMA: Patent without evidence of aneurysm, dissection, vasculitis or significant stenosis. Inflow: Patent without evidence of aneurysm, dissection, vasculitis or significant stenosis. Proximal Outflow: Bilateral common femoral and visualized portions of the superficial and profunda femoral arteries are patent without evidence of aneurysm, dissection, vasculitis or significant stenosis. Veins: No obvious venous abnormality within the limitations of this arterial phase study. Review of the MIP images confirms the above findings. NON-VASCULAR Lower chest: Right pleural effusion volume estimated less than 1 L. Hypoventilatory changes at the lung bases. Hepatobiliary: Mild nodularity of the liver capsule suggests cirrhosis. No focal liver abnormalities. The gallbladder is surgically absent. Pancreas: Unremarkable. No pancreatic ductal dilatation or surrounding inflammatory changes. Spleen: Spleen is enlarged measuring 16.2 cm in craniocaudal length. No focal abnormalities. Adrenals/Urinary Tract: Adrenal glands are unremarkable. Kidneys are normal, without renal calculi, focal lesion, or hydronephrosis. Bladder is unremarkable. Stomach/Bowel: No bowel obstruction or ileus. No bowel wall thickening or inflammatory change. Scattered diverticulosis of the colon without diverticulitis. There is no accumulation of contrast within the bowel lumen to suggest active gastrointestinal hemorrhage. Lymphatic:  Multiple borderline enlarged retroperitoneal lymph nodes are identified, largest measuring 12 mm in the left para-aortic region. These are nonspecific. Reproductive: Status post hysterectomy. No adnexal masses. Other: Small volume ascites. No free intraperitoneal gas. No abdominal wall hernia. Musculoskeletal: No acute or destructive bony lesions. Reconstructed images demonstrate no additional findings. IMPRESSION: 1. No evidence of active gastrointestinal bleeding. 2. Right pleural effusion, volume estimated less than 1 L. 3. 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 4. Diverticulosis without diverticulitis. 5. Borderline enlarged retroperitoneal lymph nodes, likely reactive. 6.  Aortic Atherosclerosis (ICD10-I70.0). Electronically Signed   By: MRanda NgoM.D.   On: 02/17/2020 03:23   [place]dom[place] Limited RUQ (LIVER/GB)  Result Date: 02/08/2020 CLINICAL DATA:  NASH. EXAM: ULTRASOUND ABDOMEN LIMITED RIGHT UPPER QUADRANT COMPARISON:  Ultrasound 04/05/2019. FINDINGS: Gallbladder: Cholecystectomy. Common bile duct: Diameter: 6.5 mm Liver: Mild increased hepatic echogenicity. Nodular hepatic border suggesting cirrhosis. No focal hepatic abnormality identified. Portal vein is patent on color Doppler imaging with normal direction of blood flow towards the liver. Other: Small amount of ascites noted. Incidental note made of right pleural effusion. IMPRESSION: 1. Cholecystectomy. No biliary distention. 2. Mild increased hepatic echogenicity. Nodular hepatic border suggesting cirrhosis. No focal hepatic abnormality identified. Portal vein patent with normal direction of flow. 3. Small amount of ascites. Right pleural effusion noted. Electronically Signed   By: TMarcello Moores Register   On: 02/08/2020 07:15   EGD: Findings:      Grade II varices were found in the lower third of the esophagus.      Moderate portal hypertensive gastropathy was found in the entire       examined  stomach.      The examined duodenum was normal. Impression:            - Grade II esophageal varices.                        -  Portal hypertensive gastropathy.                        - Normal examined duodenum.                        - No specimens collected. Recommendation:        - Return patient to hospital ward for ongoing care.                        - Resume previous diet.                        - Continue present medications.                        - Perform a colonoscopy   COlonosocopy Findings:      The perianal and digital rectal examinations were normal.      Two sessile polyps were found in the transverse colon. The polyps were 3       to 4 mm in size. These polyps were removed with a cold snare. Resection       and retrieval were complete. For hemostasis, one hemostatic clip was       successfully placed (MR conditional). There was no bleeding at the end       of the procedure.      A diffuse area of severely friable mucosa with contact bleeding was       found in the entire colon.      Non-bleeding internal hemorrhoids were found during retroflexion. The       hemorrhoids were Grade II (internal hemorrhoids that prolapse but reduce       spontaneously). Impression:            - Two 3 to 4 mm polyps in the transverse colon,                         removed with a cold snare. Resected and retrieved.                         Clip (MR conditional) was placed.                        - Friability with contact bleeding in the entire                         examined colon.                        - Non-bleeding internal hemorrhoids. Recommendation:        - Return patient to hospital ward for ongoing care.                        - Resume previous diet.                        - Continue present medications.                        - Await pathology results.           Subjective: No more bleeding overnight.  Weak.  Belly  is somewhat swollen.  Breathing at baseline, no  orthopnea, no chest pain.  Discharge Exam: Vitals:   02/18/20 1433 02/18/20 1443  BP: 126/71 137/62  Pulse: 63 (!) 59  Resp: 20 19  Temp:    SpO2: 96% 94%   Vitals:   02/18/20 1413 02/18/20 1423 02/18/20 1433 02/18/20 1443  BP: (!) 132/51 (!) 143/51 126/71 137/62  Pulse: 83 68 63 (!) 59  Resp: (!) 21 20 20 19   Temp:      TempSrc:      SpO2: 95% 94% 96% 94%  Weight:      Height:        General: Pt is alert, awake, not in acute distress, lying in bed, tired Cardiovascular: RRR, nl S1-S2, no murmurs appreciated.   No LE edema.   Respiratory: Normal respiratory rate and rhythm.  CTAB without rales or wheezes. Abdominal: Abdomen soft and non-tender.  Moderate ascites.  No distension or HSM.   Neuro/Psych: Strength symmetric in upper and lower extremities.  Judgment and insight appear normal.   The results of significant diagnostics from this hospitalization (including imaging, microbiology, ancillary and laboratory) are listed below for reference.     Microbiology: Recent Results (from the past 240 hour(s))  Resp Panel by RT-PCR (Flu A&B, Covid) Nasopharyngeal Swab     Status: None   Collection Time: 02/17/20  1:27 AM   Specimen: Nasopharyngeal Swab; Nasopharyngeal(NP) swabs in vial transport medium  Result Value Ref Range Status   SARS Coronavirus 2 by RT PCR NEGATIVE NEGATIVE Final    Comment: (NOTE) SARS-CoV-2 target nucleic acids are NOT DETECTED.  The SARS-CoV-2 RNA is generally detectable in upper respiratory specimens during the acute phase of infection. The lowest concentration of SARS-CoV-2 viral copies this assay can detect is 138 copies/mL. A negative result does not preclude SARS-Cov-2 infection and should not be used as the sole basis for treatment or other patient management decisions. A negative result may occur with  improper specimen collection/handling, submission of specimen other than nasopharyngeal swab, presence of viral mutation(s) within the areas  targeted by this assay, and inadequate number of viral copies(<138 copies/mL). A negative result must be combined with clinical observations, patient history, and epidemiological information. The expected result is Negative.  Fact Sheet for Patients:  EntrepreneurPulse.com.au  Fact Sheet for Healthcare Providers:  IncredibleEmployment.be  This test is no t yet approved or cleared by the [place] FDA and  has been authorized for detection and/or diagnosis of SARS-CoV-2 by FDA under an Emergency Use Authorization (EUA). This EUA will remain  in effect (meaning this test can be used) for the duration of the COVID-19 declaration under Section 564(b)(1) of the Act, 21 U.S.C.section 360bbb-3(b)(1), unless the authorization is terminated  or revoked sooner.       Influenza A by PCR NEGATIVE NEGATIVE Final   Influenza B by PCR NEGATIVE NEGATIVE Final    Comment: (NOTE) The Xpert Xpress SARS-CoV-2/FLU/RSV plus assay is intended as an aid in the diagnosis of influenza from Nasopharyngeal swab specimens and should not be used as a sole basis for treatment. Nasal washings and aspirates are unacceptable for Xpert Xpress SARS-CoV-2/FLU/RSV testing.  Fact Sheet for Patients: EntrepreneurPulse.com.au  Fact Sheet for Healthcare Providers: IncredibleEmployment.be  This test is not yet approved or cleared by the [place] FDA and has been authorized for detection and/or diagnosis of SARS-CoV-2 by FDA under an Emergency Use Authorization (EUA). This EUA will remain in effect (meaning this test can be used)  for the duration of the COVID-19 declaration under Section 564(b)(1) of the Act, 21 U.S.C. section 360bbb-3(b)(1), unless the authorization is terminated or revoked.  Performed at [facility], [address] [place], [place] [address]      Labs: BNP (last 3 results) Recent Labs     02/07/20 1451 02/16/20 1724  BNP 312.1* 229.7*   Basic Metabolic Panel: Recent Labs  Lab 02/16/20 1724 02/17/20 1620 02/18/20 0323  NA 137 136 138  K 4.1 3.8 3.7  CL 101 101 106  CO2 27 26 25   GLUCOSE 215* 224* 104*  BUN 32* 21 14  CREATININE 1.18* 0.86 0.79  CALCIUM 8.5* 8.6* 8.4*   Liver Function Tests: Recent Labs  Lab 02/17/20 0127 02/18/20 0323  AST 25 26  ALT 18 16  ALKPHOS 38 32*  BILITOT 0.8 1.0  PROT 7.7 7.0  ALBUMIN 3.7 3.3*   Recent Labs  Lab 02/17/20 0127  LIPASE 57*   No results for input(s): AMMONIA in the last 168 hours. CBC: Recent Labs  Lab 02/16/20 1724 02/17/20 0534 02/18/20 0323  WBC 2.8* 2.0* 1.8*  HGB 10.8* 10.0* 9.7*  HCT 34.3* 30.7* 29.4*  MCV 96.3 94.8 93.0  PLT 119* 87* 80*   Cardiac Enzymes: No results for input(s): CKTOTAL, CKMB, CKMBINDEX, TROPONINI in the last 168 hours. BNP: Invalid input(s): POCBNP CBG: Recent Labs  Lab 02/17/20 1734 02/17/20 2017 02/18/20 0833 02/18/20 1208 02/18/20 1321  GLUCAP 176* 160* 108* 116* 104*   D-Dimer No results for input(s): DDIMER in the last 72 hours. Hgb A1c No results for input(s): HGBA1C in the last 72 hours. Lipid Profile No results for input(s): CHOL, HDL, LDLCALC, TRIG, CHOLHDL, LDLDIRECT in the last 72 hours. Thyroid function studies No results for input(s): TSH, T4TOTAL, T3FREE, THYROIDAB in the last 72 hours.  Invalid input(s): FREET3 Anemia work up No results for input(s): VITAMINB12, FOLATE, FERRITIN, TIBC, IRON, RETICCTPCT in the last 72 hours. Urinalysis    Component Value Date/Time   COLORURINE YELLOW (A) 06/15/2019 1039   APPEARANCEUR CLEAR (A) 06/15/2019 1039   APPEARANCEUR Cloudy (A) 07/08/2017 1103   LABSPEC 1.014 06/15/2019 1039   PHURINE 6.0 06/15/2019 1039   GLUCOSEU Large (3+) (A) 08/18/2019 1433   GLUCOSEU NEGATIVE 06/15/2019 1039   HGBUR NEGATIVE 06/15/2019 1039   BILIRUBINUR neg 09/30/2019 0911   BILIRUBINUR Negative 07/08/2017 1103    KETONESUR NEGATIVE 06/15/2019 1039   PROTEINUR Negative 09/30/2019 0911   PROTEINUR NEGATIVE 06/15/2019 1039   UROBILINOGEN 0.2 09/30/2019 0911   NITRITE neg 09/30/2019 0911   NITRITE NEGATIVE 06/15/2019 1039   LEUKOCYTESUR Negative 09/30/2019 0911   LEUKOCYTESUR TRACE (A) 06/15/2019 1039   Sepsis Labs Invalid input(s): PROCALCITONIN,  WBC,  LACTICIDVEN Microbiology Recent Results (from the past 240 hour(s))  Resp Panel by RT-PCR (Flu A&B, Covid) Nasopharyngeal Swab     Status: None   Collection Time: 02/17/20  1:27 AM   Specimen: Nasopharyngeal Swab; Nasopharyngeal(NP) swabs in vial transport medium  Result Value Ref Range Status   SARS Coronavirus 2 by RT PCR NEGATIVE NEGATIVE Final    Comment: (NOTE) SARS-CoV-2 target nucleic acids are NOT DETECTED.  The SARS-CoV-2 RNA is generally detectable in upper respiratory specimens during the acute phase of infection. The lowest concentration of SARS-CoV-2 viral copies this assay can detect is 138 copies/mL. A negative result does not preclude SARS-Cov-2 infection and should not be used as the sole basis for treatment or other patient management decisions. A negative result may occur  with  improper specimen collection/handling, submission of specimen other than nasopharyngeal swab, presence of viral mutation(s) within the areas targeted by this assay, and inadequate number of viral copies(<138 copies/mL). A negative result must be combined with clinical observations, patient history, and epidemiological information. The expected result is Negative.  Fact Sheet for Patients:  EntrepreneurPulse.com.au  Fact Sheet for Healthcare Providers:  IncredibleEmployment.be  This test is no t yet approved or cleared by the [place] FDA and  has been authorized for detection and/or diagnosis of SARS-CoV-2 by FDA under an Emergency Use Authorization (EUA). This EUA will remain  in effect (meaning this  test can be used) for the duration of the COVID-19 declaration under Section 564(b)(1) of the Act, 21 U.S.C.section 360bbb-3(b)(1), unless the authorization is terminated  or revoked sooner.       Influenza A by PCR NEGATIVE NEGATIVE Final   Influenza B by PCR NEGATIVE NEGATIVE Final    Comment: (NOTE) The Xpert Xpress SARS-CoV-2/FLU/RSV plus assay is intended as an aid in the diagnosis of influenza from Nasopharyngeal swab specimens and should not be used as a sole basis for treatment. Nasal washings and aspirates are unacceptable for Xpert Xpress SARS-CoV-2/FLU/RSV testing.  Fact Sheet for Patients: EntrepreneurPulse.com.au  Fact Sheet for Healthcare Providers: IncredibleEmployment.be  This test is not yet approved or cleared by the [place] FDA and has been authorized for detection and/or diagnosis of SARS-CoV-2 by FDA under an Emergency Use Authorization (EUA). This EUA will remain in effect (meaning this test can be used) for the duration of the COVID-19 declaration under Section 564(b)(1) of the Act, 21 U.S.C. section 360bbb-3(b)(1), unless the authorization is terminated or revoked.  Performed at [facility], [address] [place], [place] [address]      Time coordinating discharge: 45 minutes The [place] controlled substances registry was reviewed for this patien      SIGNED:   Edwin Dada, MD  [facility] 02/18/2020, 3:36 PM

## 2020-02-18 NOTE — Transfer of Care (Addendum)
Immediate Anesthesia Transfer of Care Note  Patient: Annette Foster  Procedure(s) Performed: ESOPHAGOGASTRODUODENOSCOPY (EGD) WITH PROPOFOL (N/A ) COLONOSCOPY WITH PROPOFOL (N/A )  Patient Location: Endo Anesthesia Type:General  Level of Consciousness: drowsy and patient cooperative  Airway & Oxygen Therapy: Patient Spontanous Breathing and Patient connected to face mask oxygen  Post-op Assessment: Report given to RN and Post -op Vital signs reviewed and stable  Post vital signs: Reviewed and stable  Last Vitals:  Vitals Value Taken Time  BP 135/55 02/18/20 1403  Temp    Pulse 90 02/18/20 1404  Resp 20 02/18/20 1404  SpO2 95 % 02/18/20 1404  Vitals shown include unvalidated device data.  Last Pain:  Vitals:   02/18/20 1403  TempSrc:   PainSc: Asleep         Complications: No complications documented.

## 2020-02-18 NOTE — Anesthesia Procedure Notes (Signed)
Procedure Name: General with mask airway Performed by: Fletcher-Harrison, Dee Paden, CRNA Pre-anesthesia Checklist: Patient identified, Emergency Drugs available, Suction available and Patient being monitored Patient Re-evaluated:Patient Re-evaluated prior to induction Oxygen Delivery Method: Simple face mask Induction Type: IV induction Placement Confirmation: positive ETCO2 and CO2 detector Dental Injury: Teeth and Oropharynx as per pre-operative assessment        

## 2020-02-18 NOTE — Discharge Instructions (Signed)
Ascites  Ascites is a collection of too much fluid in the abdomen. Ascites can range from mild to severe. If ascites is not treated, it can get worse. What are the causes? This condition may be caused by:  A liver condition called cirrhosis. This is the most common cause of ascites.  Long-term (chronic) or alcoholic hepatitis.  Infection or inflammation in the abdomen.  Cancer in the abdomen.  Heart failure.  Kidney disease.  Inflammation of the pancreas.  Clots in the veins of the liver. What are the signs or symptoms? Symptoms of this condition include:  A feeling of fullness in the abdomen. This is common.  An increase in the size of the abdomen or waist.  Swelling in the legs.  Swelling of the scrotum (in men).  Difficulty breathing.  Pain in the abdomen.  Sudden weight gain. If the condition is mild, you may not have symptoms. How is this diagnosed? This condition is diagnosed based on your medical history and a physical exam. Your health care provider may order imaging tests, such as an ultrasound or CT scan of your abdomen. How is this treated? Treatment for this condition depends on the cause of the ascites. It may include:  Taking a pill to make you urinate. This is called a water pill (diuretic pill).  Strictly reducing your salt (sodium) intake. Salt can cause extra fluid to be kept (retained) in the body, and this makes ascites worse.  Having a procedure to remove fluid from your abdomen (paracentesis).  Having a procedure that connects two of the major veins within your liver and relieves pressure on your liver. This is called a TIPS procedure (transjugular intrahepatic portosystemic shunt procedure).  Placement of a drainage catheter (peritoneovenous shunt) to manage the extra fluid in the abdomen. Ascites may go away or improve when the condition that caused it is treated. Follow these instructions at home:  Keep track of your weight. To do this,  weigh yourself at the same time every day and write down your weight.  Keep track of how much you drink and any changes in how much or how often you urinate.  Follow any instructions that your health care provider gives you about how much to drink.  Try not to eat salty (high-sodium) foods.  Take over-the-counter and prescription medicines only as told by your health care provider.  Keep all follow-up visits as told by your health care provider. This is important.  Report any changes in your health to your health care provider, especially if you develop new symptoms or your symptoms get worse. Contact a health care provider if:  You gain more than 3 lb (1.36 kg) in 3 days.  Your waist size increases.  You have new swelling in your legs.  The swelling in your legs gets worse. Get help right away if:  You have a fever.  You are confused.  You have new or worsening breathing trouble.  You have new or worsening pain in your abdomen.  You have new or worsening swelling in the scrotum (in men). Summary  Ascites is a collection of too much fluid in the abdomen.  Ascites may be caused by various conditions, such as cirrhosis, hepatitis, cancer, or congestive heart failure.  Symptoms may include swelling of the abdomen and other areas due to extra fluid in the body.  Treatments may involve dietary changes, medicines, or procedures. This information is not intended to replace advice given to you by your health care  provider. Make sure you discuss any questions you have with your health care provider. Document Revised: 12/30/2017 Document Reviewed: 10/10/2016 Elsevier Patient Education  [place].   Abdominal Bloating When you have abdominal bloating, your abdomen may feel full, tight, or painful. It may also look bigger than normal or swollen (distended). Common causes of abdominal bloating include:  Swallowing air.  Constipation.  Problems digesting  food.  Eating too much.  Irritable bowel syndrome. This is a condition that affects the large intestine.  Lactose intolerance. This is an inability to digest lactose, a natural sugar in dairy products.  Celiac disease. This is a condition that affects the ability to digest gluten, a protein found in some grains.  Gastroparesis. This is a condition that slows down the movement of food in the stomach and small intestine. It is more common in people with diabetes mellitus.  Gastroesophageal reflux disease (GERD). This is a digestive condition that makes stomach acid flow back into the esophagus.  Urinary retention. This means that the body is holding onto urine, and the bladder cannot be emptied all the way. Follow these instructions at home: Eating and drinking  Avoid eating too much.  Try not to swallow air while talking or eating.  Avoid eating while lying down.  Avoid these foods and drinks: ? Foods that cause gas, such as broccoli, cabbage, cauliflower, and baked beans. ? Carbonated drinks. ? Hard candy. ? Chewing gum. Medicines  Take over-the-counter and prescription medicines only as told by your health care provider.  Take probiotic medicines. These medicines contain live bacteria or yeasts that can help digestion.  Take coated peppermint oil capsules. Activity  Try to exercise regularly. Exercise may help to relieve bloating that is caused by gas and relieve constipation. General instructions  Keep all follow-up visits as told by your health care provider. This is important. Contact a health care provider if:  You have nausea and vomiting.  You have diarrhea.  You have abdominal pain.  You have unusual weight loss or weight gain.  You have severe pain, and medicines do not help. Get help right away if:  You have severe chest pain.  You have trouble breathing.  You have shortness of breath.  You have trouble urinating.  You have darker urine than  normal.  You have blood in your stools or have dark, tarry stools. Summary  Abdominal bloating means that the abdomen is swollen.  Common causes of abdominal bloating are swallowing air, constipation, and problems digesting food.  Avoid eating too much and avoid swallowing air.  Avoid foods that cause gas, carbonated drinks, hard candy, and chewing gum. This information is not intended to replace advice given to you by your health care provider. Make sure you discuss any questions you have with your health care provider. Document Revised: 05/18/2018 Document Reviewed: 03/01/2016 Elsevier Patient Education  [place].

## 2020-02-18 NOTE — Anesthesia Preprocedure Evaluation (Signed)
Anesthesia Evaluation  Patient identified by MRN, date of birth, ID band Patient awake    Reviewed: Allergy & Precautions, H&P , NPO status , Patient's Chart, lab work & pertinent test results, reviewed documented beta blocker date and time   History of Anesthesia Complications (+) PONV and history of anesthetic complications  Airway Mallampati: II  TM Distance: >3 FB Neck ROM: full    Dental  (+) Teeth Intact, Dental Advidsory Given, Edentulous Upper, Missing, Poor Dentition   Pulmonary shortness of breath (due to anemia), sleep apnea and Continuous Positive Airway Pressure Ventilation , neg COPD, neg recent URI,    Pulmonary exam normal        Cardiovascular Exercise Tolerance: Good hypertension, On Medications (-) angina+CHF  (-) Past MI and (-) Cardiac Stents + dysrhythmias Atrial Fibrillation + Valvular Problems/Murmurs  Rate:Normal     Neuro/Psych negative neurological ROS  negative psych ROS   GI/Hepatic GERD  Medicated,(+) Hepatitis -NA[facility]   Endo/Other  diabetes, Well Controlled, Type 1Hypothyroidism   Renal/GU negative Renal ROS  negative genitourinary   Musculoskeletal   Abdominal   Peds  Hematology  (+) Blood dyscrasia, anemia ,   Anesthesia Other Findings Past Medical History: No date: Abdominal pain No date: Allergy 2017: Cancer ([place])     Comment:  breast cancer- Left No date: Cataract No date: CHF (congestive heart failure) (HCC) No date: Collagenous colitis No date: Diabetes mellitus without complication (HCC) No date: Diarrhea No date: Diverticulosis No date: Fatty liver disease, nonalcoholic No date: Fibrocystic breast No date: GERD (gastroesophageal reflux disease) No date: Heart murmur No date: Hyperlipidemia No date: Hypertension No date: Hypothyroidism No date: IBS (irritable bowel syndrome) No date: IDA (iron deficiency anemia) 2017: Personal history of radiation therapy      Comment:  LEFT BREAST CA No date: PONV (postoperative nausea and vomiting) No date: Sleep apnea     Comment:  C-Pap   Reproductive/Obstetrics negative OB ROS                             Anesthesia Physical  Anesthesia Plan  ASA: III  Anesthesia Plan: General   Post-op Pain Management:    Induction: Intravenous  PONV Risk Score and Plan: 4 or greater and TIVA and Propofol infusion  Airway Management Planned: Natural Airway and Nasal Cannula  Additional Equipment:   Intra-op Plan:   Post-operative Plan:   Informed Consent: I have reviewed the patients History and Physical, chart, labs and discussed the procedure including the risks, benefits and alternatives for the proposed anesthesia with the patient or authorized representative who has indicated his/her understanding and acceptance.       Plan Discussed with: CRNA  Anesthesia Plan Comments: (Fentanyl allergy is presumed to be related to excess dosage.  Reversed successfully according to the patient.  JA)        Anesthesia Quick Evaluation

## 2020-02-19 NOTE — Anesthesia Postprocedure Evaluation (Signed)
Anesthesia Post Note  Patient: Annette Foster  Procedure(s) Performed: ESOPHAGOGASTRODUODENOSCOPY (EGD) WITH PROPOFOL (N/A ) COLONOSCOPY WITH PROPOFOL (N/A )  Patient location during evaluation: Endoscopy Anesthesia Type: General Level of consciousness: awake and alert Pain management: pain level controlled Vital Signs Assessment: post-procedure vital signs reviewed and stable Respiratory status: spontaneous breathing, nonlabored ventilation, respiratory function stable and patient connected to nasal cannula oxygen Cardiovascular status: blood pressure returned to baseline and stable Postop Assessment: no apparent nausea or vomiting Anesthetic complications: no   No complications documented.   Last Vitals:  Vitals:   02/18/20 1433 02/18/20 1443  BP: 126/71 137/62  Pulse: 63 (!) 59  Resp: 20 19  Temp:    SpO2: 96% 94%    Last Pain:  Vitals:   02/18/20 1443  TempSrc:   PainSc: 0-No pain                 Martha Clan

## 2020-02-21 LAB — SURGICAL PATHOLOGY

## 2020-02-22 ENCOUNTER — Encounter: Payer: Self-pay | Admitting: Gastroenterology

## 2020-02-24 ENCOUNTER — Other Ambulatory Visit: Payer: Self-pay

## 2020-02-24 ENCOUNTER — Encounter: Payer: Self-pay | Admitting: Gastroenterology

## 2020-02-24 ENCOUNTER — Ambulatory Visit (INDEPENDENT_AMBULATORY_CARE_PROVIDER_SITE_OTHER): Payer: Medicare PPO | Admitting: Gastroenterology

## 2020-02-24 VITALS — BP 126/63 | HR 48 | Temp 97.2°F | Ht 63.0 in | Wt 178.8 lb

## 2020-02-24 DIAGNOSIS — K7460 Unspecified cirrhosis of liver: Secondary | ICD-10-CM | POA: Diagnosis not present

## 2020-02-24 DIAGNOSIS — J301 Allergic rhinitis due to pollen: Secondary | ICD-10-CM | POA: Insufficient documentation

## 2020-02-24 DIAGNOSIS — H1045 Other chronic allergic conjunctivitis: Secondary | ICD-10-CM | POA: Insufficient documentation

## 2020-02-24 DIAGNOSIS — J3081 Allergic rhinitis due to animal (cat) (dog) hair and dander: Secondary | ICD-10-CM | POA: Insufficient documentation

## 2020-02-24 DIAGNOSIS — K7581 Nonalcoholic steatohepatitis (NASH): Secondary | ICD-10-CM | POA: Diagnosis not present

## 2020-02-24 DIAGNOSIS — J309 Allergic rhinitis, unspecified: Secondary | ICD-10-CM | POA: Insufficient documentation

## 2020-02-24 NOTE — Progress Notes (Signed)
Primary Care Physician: Albina Billet, MD  Primary Gastroenterologist:  Dr. [name]  Chief Complaint  Patient presents with  . Hospitalization Follow-up    HPI: Annette Foster is a 80 y.o. female here for follow-up after being discharged from the hospital.  The patient had an EGD and colonoscopy for GI bleeding and this was done on January 7.  The patient was found to have grade 2 esophageal varices and has a history of nonalcoholic fatty liver disease with cirrhosis.  She also has a history of reflux and collagenous colitis.  The patient had been followed up with Dr. Vira Agar prior to coming to the hospital recently and seeing me.  Past Medical History:  Diagnosis Date  . Abdominal pain   . Allergy   . Cancer [facility]) 2017   breast cancer- Left  . Cataract   . CHF (congestive heart failure) ([place])   . Collagenous colitis   . Diabetes mellitus without complication ([place])   . Diarrhea   . Diverticulosis   . Fatty liver disease, nonalcoholic   . Fibrocystic breast   . GERD (gastroesophageal reflux disease)   . Heart murmur   . Hyperlipidemia   . Hypertension   . Hypothyroidism   . IBS (irritable bowel syndrome)   . IDA (iron deficiency anemia)   . Personal history of radiation therapy 2017   LEFT BREAST CA  . PONV (postoperative nausea and vomiting)   . Sleep apnea    C-Pap    Current Outpatient Medications  Medication Sig Dispense Refill  . budesonide-formoterol (SYMBICORT) 160-4.5 MCG/ACT inhaler 2 puffs    . aspirin 81 MG chewable tablet 1 tablet    . azelastine (OPTIVAR) 0.05 % ophthalmic solution 1 drop into affected eye    . benzonatate (TESSALON) 200 MG capsule Take 200 mg by mouth 3 (three) times daily as needed for cough.    . Cholecalciferol (VITAMIN D) 50 MCG (2000 UT) CAPS Take 2,000 Units by mouth daily.    . clobetasol ointment (TEMOVATE) 8.92 % Apply 1 application topically 2 (two) times a week. 45 g 1  . cyanocobalamin (,VITAMIN B-12,) 1000 MCG/ML  injection 1,000 mcg every 30 (thirty) days.    Marland Kitchen desloratadine (CLARINEX) 5 MG tablet Take 1 tablet by mouth daily.    Marland Kitchen dicyclomine (BENTYL) 10 MG capsule Take 10 mg by mouth at bedtime.     . docusate sodium (COLACE) 100 MG capsule Take 1 capsule by mouth 2 (two) times daily as needed.    Marland Kitchen EPINEPHrine 0.3 mg/0.3 mL IJ SOAJ injection     . escitalopram (LEXAPRO) 5 MG tablet Take 5 mg by mouth at bedtime.    Marland Kitchen esomeprazole (NEXIUM) 40 MG capsule Take 40 mg by mouth 2 (two) times daily before a meal.     . estradiol (ESTRACE) 0.1 MG/GM vaginal cream Place 1.19 Applicatorfuls vaginally 2 (two) times a week. 42.5 g 2  . fenofibrate 160 MG tablet Take 160 mg by mouth at bedtime.    . fexofenadine (ALLEGRA) 180 MG tablet 1 tablet    . fluticasone (FLONASE) 50 MCG/ACT nasal spray Place 1 spray into the nose daily as needed for allergies.    . furosemide (LASIX) 20 MG tablet Take 20-40 mg by mouth See admin instructions. Take once a day. alternate taking 49m and 44m    . gabapentin (NEURONTIN) 300 MG capsule Take 300 mg by mouth at bedtime.     . Marland Kitchenlimepiride (AMARYL) 2 MG  tablet Take 2 mg by mouth at bedtime.    . hydrOXYzine (ATARAX/VISTARIL) 25 MG tablet 1 tablet as needed    . Hyoscyamine Sulfate 0.375 MG TBCR Take 0.375 mg by mouth 2 (two) times daily.     . insulin aspart (NOVOLOG) 100 UNIT/ML FlexPen Inject 15 Units into the skin 3 (three) times daily with meals.    . Insulin Degludec (TRESIBA) 100 UNIT/ML SOLN Inject 50 mg into the skin at bedtime.    . insulin lispro (HUMALOG KWIKPEN) 100 UNIT/ML KwikPen See admin instructions.    Marland Kitchen levothyroxine (SYNTHROID, LEVOTHROID) 75 MCG tablet Take 75 mcg by mouth daily before breakfast.     . magnesium oxide (MAG-OX) 400 MG tablet Take 400 mg by mouth 2 (two) times daily.     . Magnesium Oxide, Elemental, 400 MG TABS See admin instructions.    . meloxicam (MOBIC) 15 MG tablet 1 tablet    . metFORMIN (GLUCOPHAGE-XR) 750 MG 24 hr tablet Take 750 mg  by mouth daily with breakfast.     . metoprolol tartrate (LOPRESSOR) 25 MG tablet Take 1 tablet (25 mg total) by mouth 2 (two) times daily. (Patient not taking: No sig reported) 60 tablet 0  . NOVOLOG FLEXPEN 100 UNIT/ML FlexPen Based on sliding scale (Patient not taking: Reported on 02/17/2020)    . ondansetron (ZOFRAN) 4 MG tablet Take 4 mg by mouth every 8 (eight) hours as needed for nausea or vomiting.  1  . potassium chloride (KLOR-CON) 20 MEQ packet 1 packet with food    . potassium chloride SA (K-DUR,KLOR-CON) 20 MEQ tablet Take 20 mEq by mouth 2 (two) times daily.     Marland Kitchen saccharomyces boulardii (FLORASTOR) 250 MG capsule Take 250 mg by mouth 2 (two) times daily.     . Semaglutide,0.25 or 0.5MG/DOS, 2 MG/1.5ML SOPN Inject 0.5 mg into the skin once a week. On Sunday.    . simvastatin (ZOCOR) 20 MG tablet Take 20 mg by mouth at bedtime.    Marland Kitchen spironolactone (ALDACTONE) 25 MG tablet Take 1 tablet (25 mg total) by mouth daily. 30 tablet 3  . traMADol (ULTRAM) 50 MG tablet 1 tablet as needed    . valACYclovir (VALTREX) 500 MG tablet Take 500 mg by mouth See admin instructions. For fever blisters take 58m twice a day as needed  1  . valsartan (DIOVAN) 160 MG tablet 1 tablet     No current facility-administered medications for this visit.    Allergies as of 02/24/2020 - Review Complete 02/24/2020  Allergen Reaction Noted  . Codeine Itching and Nausea And Vomiting 06/28/2014  . Demeclocycline Rash 04/10/2012  . Demerol [meperidine] Itching and Nausea And Vomiting 06/28/2014  . Hydrocodone Itching and Nausea And Vomiting 08/24/2018  . Oxycodone Itching and Nausea And Vomiting 09/26/2015  . Pentazocine Itching and Nausea And Vomiting 04/10/2012  . Tetracyclines & related Rash 06/28/2014  . Fentanyl Itching and Nausea And Vomiting 06/28/2014    ROS:  General: Negative for anorexia, weight loss, fever, chills, fatigue, weakness. ENT: Negative for hoarseness, difficulty swallowing , nasal  congestion. CV: Negative for chest pain, angina, palpitations, dyspnea on exertion, peripheral edema.  Respiratory: Negative for dyspnea at rest, dyspnea on exertion, cough, sputum, wheezing.  GI: See history of present illness. GU:  Negative for dysuria, hematuria, urinary incontinence, urinary frequency, nocturnal urination.  Endo: Negative for unusual weight change.    Physical Examination:   BP 126/63   Pulse (!) 48   Temp (!) 97.2  F (36.2 C) (Temporal)   Ht 5' 3"  (1.6 m)   Wt 178 lb 12.8 oz (81.1 kg)   LMP  (LMP Unknown)   BMI 31.67 kg/m   General: Well-nourished, well-developed in no acute distress.  Eyes: No icterus. Conjunctivae pink. Lungs: Clear to auscultation bilaterally. Non-labored. Heart: Regular rate and rhythm, no murmurs rubs or gallops.  Abdomen: Bowel sounds are normal, nontender, nondistended, no hepatosplenomegaly or masses, no abdominal bruits or hernia , no rebound or guarding.   Extremities: No lower extremity edema. No clubbing or deformities. Neuro: Alert and oriented x 3.  Grossly intact. Skin: Warm and dry, no jaundice.   Psych: Alert and cooperative, normal mood and affect.  Labs:    Imaging Studies: DG Chest 1 View  Result Date: 02/07/2020 CLINICAL DATA:  Chest pain. EXAM: CHEST  1 VIEW COMPARISON:  February 19, 2016. FINDINGS: Stable cardiomediastinal silhouette. No pneumothorax or pleural effusion is noted. Mild central pulmonary vascular congestion is noted. Bilateral interstitial densities are noted concerning for edema or possibly atypical inflammation. Bony thorax is unremarkable. IMPRESSION: Mild central pulmonary vascular congestion. Bilateral interstitial densities are noted concerning for edema or possibly atypical inflammation. Electronically Signed   By: Marijo Conception M.D.   On: 02/07/2020 15:28   DG Chest 2 View  Result Date: 02/16/2020 CLINICAL DATA:  Shortness of breath EXAM: CHEST - 2 VIEW COMPARISON:  February 07, 2020 FINDINGS:  There is interstitial thickening bilaterally with areas of scarring in the left mid lung. No evident consolidation or well-defined airspace opacity. Heart is mildly enlarged with pulmonary vascularity normal. No adenopathy. No bone lesions. IMPRESSION: Scarring left mid lung. Areas of interstitial thickening, likely due to chronic bronchitis. No frank edema or airspace opacity evident. Stable cardiac prominence. Electronically Signed   By: Lowella Grip III M.D.   On: 02/16/2020 18:15   MM DIAG BREAST TOMO BILATERAL  Result Date: 01/31/2020 CLINICAL DATA:  History of a left lumpectomy for breast carcinoma performed in 2017. Patient has had 2 subsequent benign biopsies in the lumpectomy bed. Patient was treated with adjuvant radiation therapy. EXAM: DIGITAL DIAGNOSTIC BILATERAL MAMMOGRAM WITH TOMO AND CAD COMPARISON:  Previous exam(s). ACR Breast Density Category c: The breast tissue is heterogeneously dense, which may obscure small masses. FINDINGS: Post lumpectomy changes on the left are stable from the most recent prior exam. There are no masses or areas of nonsurgical architectural distortion. There are no suspicious calcifications. Mammographic images were processed with CAD. IMPRESSION: 1. No evidence of new or recurrent breast carcinoma. 2. Benign post lumpectomy changes on the left. RECOMMENDATION: Diagnostic mammogram in 1 year per standard post lumpectomy protocol. (Code:DM-B-01Y) I have discussed the findings and recommendations with the patient. If applicable, a reminder letter will be sent to the patient regarding the next appointment. BI-RADS CATEGORY  2: Benign. Electronically Signed   By: Lajean Manes M.D.   On: 01/31/2020 10:14   ECHOCARDIOGRAM COMPLETE  Result Date: 02/08/2020    ECHOCARDIOGRAM REPORT   Patient Name:   MATTI KILLINGSWORTH Date of Exam: 02/08/2020 Medical Rec #:  [id number]           Height:       63.0 in Accession #:    [id number]          Weight:       165.0 lb Date  of Birth:  [birth year]           BSA:  1.782 m Patient Age:    68 years            BP:           107/47 mmHg Patient Gender: F                   HR:           78 bpm. Exam Location:  [facility] Procedure: 2D Echo, Cardiac Doppler and Color Doppler Indications:     Atrial Fibrillation 148.91  History:         Patient has no prior history of Echocardiogram examinations.                  CHF; Risk Factors:Hypertension and Diabetes.  Sonographer:     Sherrie Sport RDCS (AE) Referring Phys:  [id number] Rise Mu Diagnosing Phys: Nelva Bush MD  Sonographer Comments: Technically challenging study due to limited acoustic windows and suboptimal apical window. IMPRESSIONS  1. Left ventricular ejection fraction, by estimation, is 50 to 55%. The left ventricle has low normal function. Left ventricular endocardial border not optimally defined to evaluate regional wall motion. There is mild left ventricular hypertrophy. Left ventricular diastolic parameters are indeterminate.  2. Right ventricular systolic function is mildly reduced. The right ventricular size is normal. Mildly increased right ventricular wall thickness. There is moderately elevated pulmonary artery systolic pressure.  3. Left atrial size was moderately dilated.  4. Right atrial size was mildly dilated.  5. The pericardial effusion is posterior to the left ventricle.  6. The mitral valve is abnormal. Trivial mitral valve regurgitation.  7. Tricuspid valve regurgitation is mild to moderate.  8. The aortic valve is tricuspid. There is mild calcification of the aortic valve. There is mild thickening of the aortic valve. Aortic valve regurgitation is not visualized. Mild to moderate aortic valve sclerosis/calcification is present, without any evidence of aortic stenosis.  9. There is mild dilatation of the ascending aorta, measuring 36 mm. 10. The inferior vena cava is dilated in size with <50% respiratory variability, suggesting right atrial pressure of 15 mmHg.  FINDINGS  Left Ventricle: Left ventricular ejection fraction, by estimation, is 50 to 55%. The left ventricle has low normal function. Left ventricular endocardial border not optimally defined to evaluate regional wall motion. The left ventricular internal cavity  size was normal in size. There is mild left ventricular hypertrophy. Left ventricular diastolic parameters are indeterminate. Right Ventricle: The right ventricular size is normal. Mildly increased right ventricular wall thickness. Right ventricular systolic function is mildly reduced. There is moderately elevated pulmonary artery systolic pressure. The tricuspid regurgitant velocity is 2.95 m/s, and with an assumed right atrial pressure of 15 mmHg, the estimated right ventricular systolic pressure is 06.2 mmHg. Left Atrium: Left atrial size was moderately dilated. Right Atrium: Right atrial size was mildly dilated. Pericardium: Trivial pericardial effusion is present. The pericardial effusion is posterior to the left ventricle. Presence of pericardial fat pad. Mitral Valve: The mitral valve is abnormal. There is mild calcification of the mitral valve leaflet(s). Trivial mitral valve regurgitation. Tricuspid Valve: The tricuspid valve is not well visualized. Tricuspid valve regurgitation is mild to moderate. Aortic Valve: The aortic valve is tricuspid. There is mild calcification of the aortic valve. There is mild thickening of the aortic valve. There is mild aortic valve annular calcification. Aortic valve regurgitation is not visualized. Mild to moderate aortic valve sclerosis/calcification is present, without any evidence of aortic stenosis. Aortic valve mean gradient measures 3.0  mmHg. Aortic valve peak gradient measures 5.0 mmHg. Aortic valve area, by VTI measures 2.26 cm. Pulmonic Valve: The pulmonic valve was grossly normal. Pulmonic valve regurgitation is mild. No evidence of pulmonic stenosis. Aorta: The aortic root is normal in size and  structure. There is mild dilatation of the ascending aorta, measuring 36 mm. Pulmonary Artery: The pulmonary artery is not well seen. Venous: The inferior vena cava is dilated in size with less than 50% respiratory variability, suggesting right atrial pressure of 15 mmHg. IAS/Shunts: The interatrial septum was not well visualized.  LEFT VENTRICLE PLAX 2D LVIDd:         3.82 cm  Diastology LVIDs:         2.63 cm  LV e' medial:    5.44 cm/s LV PW:         1.27 cm  LV E/e' medial:  20.4 LV IVS:        1.01 cm  LV e' lateral:   6.09 cm/s LVOT diam:     2.00 cm  LV E/e' lateral: 18.2 LV SV:         40 LV SV Index:   22 LVOT Area:     3.14 cm  RIGHT VENTRICLE RV Basal diam:  3.92 cm RV S prime:     8.27 cm/s TAPSE (M-mode): 1.8 cm LEFT ATRIUM            Index       RIGHT ATRIUM           Index LA diam:      4.20 cm  2.36 cm/m  RA Area:     20.50 cm LA Vol (A2C): 17.6 ml  9.88 ml/m  RA Volume:   52.50 ml  29.46 ml/m LA Vol (A4C): 105.0 ml 58.93 ml/m  AORTIC VALVE                   PULMONIC VALVE AV Area (Vmax):    1.88 cm    PV Vmax:        0.72 m/s AV Area (Vmean):   1.80 cm    PV Peak grad:   2.1 mmHg AV Area (VTI):     2.26 cm    RVOT Peak grad: 2 mmHg AV Vmax:           111.50 cm/s AV Vmean:          79.600 cm/s AV VTI:            0.176 m AV Peak Grad:      5.0 mmHg AV Mean Grad:      3.0 mmHg LVOT Vmax:         66.60 cm/s LVOT Vmean:        45.500 cm/s LVOT VTI:          0.127 m LVOT/AV VTI ratio: 0.72  AORTA Ao Root diam: 3.43 cm MITRAL VALVE                TRICUSPID VALVE MV Area (PHT): 9.03 cm     TR Peak grad:   34.8 mmHg MV Decel Time: 84 msec      TR Vmax:        295.00 cm/s MV E velocity: 111.00 cm/s MV A velocity: 138.00 cm/s  SHUNTS MV E/A ratio:  0.80         Systemic VTI:  0.13 m  Systemic Diam: 2.00 cm Nelva Bush MD Electronically signed by Nelva Bush MD Signature Date/Time: 02/08/2020/9:30:21 AM    Final    [place] ASCITES (ABDOMEN LIMITED)  Result Date:  02/18/2020 CLINICAL DATA:  Ascites, abdominal distension. EXAM: LIMITED ABDOMEN ULTRASOUND FOR ASCITES TECHNIQUE: Limited ultrasound survey for ascites was performed in all four abdominal quadrants. COMPARISON:  02/17/2020 and prior. FINDINGS: Sonographic images were obtained of the 4 abdominal quadrants. Small volume free fluid was seen within the right upper and right lower quadrants. Incidental splenomegaly. IMPRESSION: Small volume right hemiabdomen ascites. Splenomegaly. Electronically Signed   By: Primitivo Gauze M.D.   On: 02/18/2020 11:59   CT Angio Abd/Pel W and/or Wo Contrast  Result Date: 02/17/2020 CLINICAL DATA:  Bright red blood per rectum, dark blood in stool for 4 days, anticoagulated, history of breast cancer EXAM: CTA ABDOMEN AND PELVIS WITHOUT AND WITH CONTRAST TECHNIQUE: Multidetector CT imaging of the abdomen and pelvis was performed using the standard protocol during bolus administration of intravenous contrast. Multiplanar reconstructed images and MIPs were obtained and reviewed to evaluate the vascular anatomy. CONTRAST:  164m OMNIPAQUE IOHEXOL 350 MG/ML SOLN COMPARISON:  05/20/2017 FINDINGS: VASCULAR Aorta: Normal caliber aorta without aneurysm, dissection, vasculitis or significant stenosis. Celiac: Patent without evidence of aneurysm, dissection, vasculitis or significant stenosis. SMA: Patent without evidence of aneurysm, dissection, vasculitis or significant stenosis. Renals: There are 3 renal arteries seen bilaterally. Vessels are patent without evidence of aneurysm, dissection, or significant stenosis. IMA: Patent without evidence of aneurysm, dissection, vasculitis or significant stenosis. Inflow: Patent without evidence of aneurysm, dissection, vasculitis or significant stenosis. Proximal Outflow: Bilateral common femoral and visualized portions of the superficial and profunda femoral arteries are patent without evidence of aneurysm, dissection, vasculitis or significant  stenosis. Veins: No obvious venous abnormality within the limitations of this arterial phase study. Review of the MIP images confirms the above findings. NON-VASCULAR Lower chest: Right pleural effusion volume estimated less than 1 L. Hypoventilatory changes at the lung bases. Hepatobiliary: Mild nodularity of the liver capsule suggests cirrhosis. No focal liver abnormalities. The gallbladder is surgically absent. Pancreas: Unremarkable. No pancreatic ductal dilatation or surrounding inflammatory changes. Spleen: Spleen is enlarged measuring 16.2 cm in craniocaudal length. No focal abnormalities. Adrenals/Urinary Tract: Adrenal glands are unremarkable. Kidneys are normal, without renal calculi, focal lesion, or hydronephrosis. Bladder is unremarkable. Stomach/Bowel: No bowel obstruction or ileus. No bowel wall thickening or inflammatory change. Scattered diverticulosis of the colon without diverticulitis. There is no accumulation of contrast within the bowel lumen to suggest active gastrointestinal hemorrhage. Lymphatic: Multiple borderline enlarged retroperitoneal lymph nodes are identified, largest measuring 12 mm in the left para-aortic region. These are nonspecific. Reproductive: Status post hysterectomy. No adnexal masses. Other: Small volume ascites. No free intraperitoneal gas. No abdominal wall hernia. Musculoskeletal: No acute or destructive bony lesions. Reconstructed images demonstrate no additional findings. IMPRESSION: 1. No evidence of active gastrointestinal bleeding. 2. Right pleural effusion, volume estimated less than 1 L. 3. 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 4. Diverticulosis without diverticulitis. 5. Borderline enlarged retroperitoneal lymph nodes, likely reactive. 6.  Aortic Atherosclerosis (ICD10-I70.0). Electronically Signed   By: MRanda NgoM.D.   On: 02/17/2020 03:23   [place] Limited RUQ (LIVER/GB)  Result Date:  02/08/2020 CLINICAL DATA:  NASH. EXAM: ULTRASOUND ABDOMEN LIMITED RIGHT UPPER QUADRANT COMPARISON:  Ultrasound 04/05/2019. FINDINGS: Gallbladder: Cholecystectomy. Common bile duct: Diameter: 6.5 mm Liver: Mild increased hepatic echogenicity. Nodular hepatic border suggesting cirrhosis. No focal hepatic abnormality identified.  Portal vein is patent on color Doppler imaging with normal direction of blood flow towards the liver. Other: Small amount of ascites noted. Incidental note made of right pleural effusion. IMPRESSION: 1. Cholecystectomy. No biliary distention. 2. Mild increased hepatic echogenicity. Nodular hepatic border suggesting cirrhosis. No focal hepatic abnormality identified. Portal vein patent with normal direction of flow. 3. Small amount of ascites. Right pleural effusion noted. Electronically Signed   By: [place]   On: 02/08/2020 07:15    Assessment and Plan:   Annette Foster is a 80 y.o. y/o female who comes for follow-up after being discharged from the hospital.  The patient has had no further GI bleeding since discharge. The patient did have varices and it is not on any beta blocker at the present time.  The patient states that her heart rate runs slowly and I have sent a message to her cardiologist who she was seeing tomorrow so that they can evaluate her to see if she can be started on medication for her portal hypertension.  The patient has also been told that she will need a ultrasound every 6 months to keep up with her surveillance for hepatocellular carcinoma.  The patient has been explained the plan and agrees with it.     [name], MD. Marval Regal    Note: This dictation was prepared with Dragon dictation along with smaller phrase technology. Any transcriptional errors that result from this process are unintentional.

## 2020-02-24 NOTE — Progress Notes (Signed)
Cardiology Office Note:    Date:  02/25/2020   ID:  Okla Qazi, DOB 10-26-1940, MRN [id number]  PCP:  Albina Billet, MD  [facility] Cardiologist:  Nelva Bush, MD  [facility] Electrophysiologist:  None   Referring MD: Albina Billet, MD   Chief Complaint: hospital follow-up  History of Present Illness:    Annette Foster is a 80 y.o. female with a hx of reported cardiac surgery in the 0938H of uncertain etiology (valvular heart disease), CHF, left-sided breast cancer s/p mastectomy, colitis with intermittent GIB, insulin-dependent diabetes, pancytopenia, HTN, HLD, NASH, iron deficiency anemia requiring iron infusions, seizure disorder, OSA on CPAP and reflux who is being seen for hospital follow-up for aflutter.   The patient was admitted 12/27- 12/28 for new onset afib RVR. With CHADSVASC of 6 anticoagulation was indicated. Given extensive GI history with bleeding and it was recommended GI weight however GI did not want to weigh in due to chronicity of issues. She was started on Eliquis. Echo showed LVEF 50-55%, mild LVH, mildy reduced RV function, moderately dilated LA and RA. During the admission she had a site reaction to dilt and was placed on Toprol.   The patient presented back to the ED 02/16/19 with sob and rectal bleeding. She stopped her Eliquis when it continued. On presentation Hgb was down to 10g/dL. PAtietn was also bradycardic and digoxin and metoprolol were stopped. GI saw who performed EGD/colonoscopy. 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 Colonoscopy showed nonspecific friable colitis. Eliquis was discontinued permanently. Valsartan was stopped 2/2 kidney disease. Heart rates normalized and low dose metoprolol was started (lopressor 62m once dialy). She was also d/c'd on spironolactone.   Today, she reports she has been short of breath since discharge. She also feels her abdomen is full. She doesn't feel symptoms have been  getting worse but they're not getting better. Reports dependent lower leg edema as well. Weights, according to her scale, have been stable but in December she was 168lbs and today she is 178lbs. She has been taking lasix 20 mg one day and 40 mg the next day. She sas discharged on spironolactone which she has been taking. She saw GI yesterday and her abdominal fullness was not discussed. GI contacted [place] request we start nadolol 46mdaily for the esophageal varices however patient's heart rate has been low. It was upper 40s yesterday and lower 50s today and she is only taking Lopressor 2561maily  instead of twice daily. Denies chest pain, dizziness, lightheadedness. She reports stable orthopnea. She is off Eliquis and Aspirin. Denies BRBPR or dark stools.    Past Medical History:  Diagnosis Date  . Abdominal pain   . Allergy   . [facility]   breast cancer- Left  . Cataract   . CHF (congestive heart failure) ([place] . Collagenous colitis   . Diabetes mellitus without complication ([place] . Diarrhea   . Diverticulosis   . Fatty liver disease, nonalcoholic   . Fibrocystic breast   . GERD (gastroesophageal reflux disease)   . Heart murmur   . Hyperlipidemia   . Hypertension   . Hypothyroidism   . IBS (irritable bowel syndrome)   . IDA (iron deficiency anemia)   . Personal history of radiation therapy 2017   LEFT BREAST CA  . PONV (postoperative nausea and vomiting)   . Sleep apnea    C-Pap    Past Surgical History:  Procedure Laterality  Date  . ABDOMINAL HYSTERECTOMY     tah bso  . ABDOMINAL SURGERY    . APPENDECTOMY    . BREAST BIOPSY Bilateral 2016   negative  . 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 BREAST BIOPSY Left 05/27/2016   BENIGN MAMMARY EPITHELIUM  . BREAST BIOPSY Left 11/20/2017   affirm bx x clip BENIGN MAMMARY EPITHELIUM CONSISTENT WITH RAD THERAPY  . BREAST LUMPECTOMY Left 10/17/2015   DCIS and papillary carcinoma  insitu, clear margins  . CARDIAC SURGERY     has replacement valve  . CATARACT EXTRACTION Right   . CHOLECYSTECTOMY    . COLONOSCOPY WITH PROPOFOL N/A 03/11/2016   Procedure: COLONOSCOPY WITH PROPOFOL;  Surgeon: Manya Silvas, MD;  Location[facility]COPY;  Service: Endoscopy;  Laterality: N/A;  . COLONOSCOPY WITH PROPOFOL N/A 02/18/2020   Procedure: COLONOSCOPY WITH PROPOFOL;  Surgeon: [name], MD;  Location: [facility] ENDOSCOPY;  Service: Endoscopy;  Laterality: N/A;  . ESOPHAGOGASTRODUODENOSCOPY (EGD) WITH PROPOFOL N/A 03/11/2016   Procedure: ESOPHAGOGASTRODUODENOSCOPY (EGD) WITH PROPOFOL;  Surgeon: Manya Silvas, MD;  Location: [facility] ENDOSCOPY;  Service: Endoscopy;  Laterality: N/A;  . ESOPHAGOGASTRODUODENOSCOPY (EGD) WITH PROPOFOL N/A 02/18/2020   Procedure: ESOPHAGOGASTRODUODENOSCOPY (EGD) WITH PROPOFOL;  Surgeon: [name], MD;  Location: [facility] ENDOSCOPY;  Service: Endoscopy;  Laterality: N/A;  . FINGER ARTHROSCOPY WITH CARPOMETACARPEL ([place]) ARTHROPLASTY Right 09/03/2018   Procedure: CARPOMETACARPEL [facility]) ARTHROPLASTY RIGHT THUMB;  Surgeon: Hessie Knows, MD;  Location: [facility] ORS;  Service: Orthopedics;  Laterality: Right;  . GANGLION CYST EXCISION Right 09/03/2018   Procedure: REMOVAL GANGLION OF WRIST;  Surgeon: Hessie Knows, MD;  Location: [facility] ORS;  Service: Orthopedics;  Laterality: Right;  . HARDWARE REMOVAL Right 09/03/2018   Procedure: HARDWARE REMOVAL RIGHT THUMB;  Surgeon: Hessie Knows, MD;  Location: [facility] ORS;  Service: Orthopedics;  Laterality: Right;  staple removed  . JOINT REPLACEMENT Left    TKR  . left sinusplasty     . MASTECTOMY, PARTIAL Left 10/17/2015   Procedure: MASTECTOMY PARTIAL REVISION;  Surgeon: Leonie Green, MD;  Location: [facility] ORS;  Service: General;  Laterality: Left;  . PARTIAL MASTECTOMY WITH NEEDLE LOCALIZATION Left 09/29/2015   Procedure: PARTIAL MASTECTOMY WITH NEEDLE LOCALIZATION;  Surgeon: Leonie Green, MD;  Location: [facility] ORS;   Service: General;  Laterality: Left;  . TOTAL ABDOMINAL HYSTERECTOMY W/ BILATERAL SALPINGOOPHORECTOMY      Current Medications: Current Meds  Medication Sig  . azelastine (OPTIVAR) 0.05 % ophthalmic solution 1 drop into affected eye  . benzonatate (TESSALON) 200 MG capsule Take 200 mg by mouth 3 (three) times daily as needed for cough.  . carvedilol (COREG) 3.125 MG tablet Take 1 tablet (3.125 mg total) by mouth 2 (two) times daily.  . Cholecalciferol (VITAMIN D) 50 MCG (2000 UT) CAPS Take 2,000 Units by mouth daily.  . clobetasol ointment (TEMOVATE) 2.99 % Apply 1 application topically 2 (two) times a week.  . cyanocobalamin (,VITAMIN B-12,) 1000 MCG/ML injection 1,000 mcg every 30 (thirty) days.  Marland Kitchen desloratadine (CLARINEX) 5 MG tablet Take 1 tablet by mouth daily.  Marland Kitchen dicyclomine (BENTYL) 10 MG capsule Take 10 mg by mouth at bedtime.   . docusate sodium (COLACE) 100 MG capsule Take 1 capsule by mouth 2 (two) times daily as needed.  Marland Kitchen EPINEPHrine 0.3 mg/0.3 mL IJ SOAJ injection Inject 0.3 mg into the muscle as needed.  Marland Kitchen escitalopram (LEXAPRO) 5 MG tablet Take 5 mg by mouth at bedtime.  Marland Kitchen esomeprazole (  NEXIUM) 40 MG capsule Take 40 mg by mouth 2 (two) times daily before a meal.   . estradiol (ESTRACE) 0.1 MG/GM vaginal cream Place 1 Applicatorful vaginally as needed.  . fenofibrate 160 MG tablet Take 160 mg by mouth at bedtime.  . fexofenadine (ALLEGRA) 180 MG tablet 1 tablet  . fluticasone (FLONASE) 50 MCG/ACT nasal spray Place 1 spray into the nose daily as needed for allergies.  Marland Kitchen gabapentin (NEURONTIN) 300 MG capsule Take 300 mg by mouth at bedtime.   Marland Kitchen glimepiride (AMARYL) 2 MG tablet Take 2 mg by mouth at bedtime.  . hydrOXYzine (ATARAX/VISTARIL) 25 MG tablet 1 tablet as needed  . Hyoscyamine Sulfate 0.375 MG TBCR Take 0.375 mg by mouth 2 (two) times daily.   . Insulin Degludec (TRESIBA) 100 UNIT/ML SOLN Inject 50 mg into the skin at bedtime.  Marland Kitchen levothyroxine (SYNTHROID, LEVOTHROID)  75 MCG tablet Take 75 mcg by mouth daily before breakfast.   . magnesium oxide (MAG-OX) 400 MG tablet Take 400 mg by mouth 2 (two) times daily.   . metFORMIN (GLUCOPHAGE-XR) 750 MG 24 hr tablet Take 750 mg by mouth daily with breakfast.   . NOVOLOG FLEXPEN 100 UNIT/ML FlexPen Inject 15 Units into the skin 3 (three) times daily with meals. Based on sliding scale  . ondansetron (ZOFRAN) 4 MG tablet Take 4 mg by mouth every 8 (eight) hours as needed for nausea or vomiting.  . potassium chloride SA (K-DUR,KLOR-CON) 20 MEQ tablet Take 20 mEq by mouth 2 (two) times daily.   Marland Kitchen saccharomyces boulardii (FLORASTOR) 250 MG capsule Take 250 mg by mouth 2 (two) times daily.   . Semaglutide,0.25 or 0.5MG/DOS, 2 MG/1.5ML SOPN Inject 0.5 mg into the skin once a week. On Sunday.  . simvastatin (ZOCOR) 20 MG tablet Take 20 mg by mouth at bedtime.  Marland Kitchen spironolactone (ALDACTONE) 25 MG tablet Take 1 tablet (25 mg total) by mouth daily.  Marland Kitchen torsemide (DEMADEX) 20 MG tablet Take 1 tablet (20 mg total) by mouth 2 (two) times daily.  . traMADol (ULTRAM) 50 MG tablet 1 tablet as needed  . valACYclovir (VALTREX) 500 MG tablet Take 500 mg by mouth See admin instructions. For fever blisters take 548m twice a day as needed  . [DISCONTINUED] furosemide (LASIX) 20 MG tablet Take 20-40 mg by mouth See admin instructions. Take once a day. alternate taking 261mand 401m . [DISCONTINUED] metoprolol tartrate (LOPRESSOR) 25 MG tablet Take 25 mg by mouth daily.     Allergies:   Codeine, Demeclocycline, Demerol [meperidine], Hydrocodone, Oxycodone, Pentazocine, Tetracyclines & related, and Fentanyl   Social History   Socioeconomic History  . Marital status: Married    Spouse name: Not on file  . Number of children: Not on file  . Years of education: Not on file  . Highest education level: Not on file  Occupational History  . Not on file  Tobacco Use  . Smoking status: Never Smoker  . Smokeless tobacco: Never Used  Vaping  Use  . Vaping Use: Never used  Substance and Sexual Activity  . Alcohol use: No    Alcohol/week: 0.0 standard drinks  . Drug use: No  . Sexual activity: Not Currently    Birth control/protection: Surgical  Other Topics Concern  . Not on file  Social History Narrative  . Not on file   Social Determinants of Health   Financial Resource Strain: Not on file  Food Insecurity: Not on file  Transportation Needs: Not on file  Physical Activity: Not on file  Stress: Not on file  Social Connections: Not on file     Family History: The patient's *family history includes Bladder Cancer in her brother; Breast cancer in her paternal grandmother; Colon cancer in her father and maternal uncle; Diabetes in her brother and sister; Heart disease in her brother; Leukemia in her mother; Prostate cancer in her brother and brother. There is no history of Ovarian cancer or Kidney cancer.  ROS:   Please see the history of present illness.     All other systems reviewed and are negative.  EKGs/Labs/Other Studies Reviewed:    The following studies were reviewed today:  Echo 02/08/20  1. Left ventricular ejection fraction, by estimation, is 50 to 55%. The  left ventricle has low normal function. Left ventricular endocardial  border not optimally defined to evaluate regional wall motion. There is  mild left ventricular hypertrophy. Left  ventricular diastolic parameters are indeterminate.  2. Right ventricular systolic function is mildly reduced. The right  ventricular size is normal. Mildly increased right ventricular wall  thickness. There is moderately elevated pulmonary artery systolic  pressure.  3. Left atrial size was moderately dilated.  4. Right atrial size was mildly dilated.  5. The pericardial effusion is posterior to the left ventricle.  6. The mitral valve is abnormal. Trivial mitral valve regurgitation.  7. Tricuspid valve regurgitation is mild to moderate.  8. The aortic  valve is tricuspid. There is mild calcification of the  aortic valve. There is mild thickening of the aortic valve. Aortic valve  regurgitation is not visualized. Mild to moderate aortic valve  sclerosis/calcification is present, without any  evidence of aortic stenosis.  9. There is mild dilatation of the ascending aorta, measuring 36 mm.  10. The inferior vena cava is dilated in size with <50% respiratory  variability, suggesting right atrial pressure of 15 mmHg.   EKG:  EKG is  ordered today.  The ekg ordered today demonstrates SB, 51bpm, nonspecific T wave abnormality  Recent Labs: 02/07/2020: Magnesium 2.3; TSH 3.169 02/16/2020: B Natriuretic Peptide 377.8 02/18/2020: ALT 16; BUN 14; Creatinine, Ser 0.79; Hemoglobin 9.7; Platelets 80; Potassium 3.7; Sodium 138  Recent Lipid Panel No results found for: CHOL, TRIG, HDL, CHOLHDL, VLDL, LDLCALC, LDLDIRECT   Risk Assessment/Calculations:     CHA2DS2-VASc Score = 6  This indicates a 9.7% annual risk of stroke. The patient's score is based upon: CHF History: Yes HTN History: Yes Diabetes History: Yes Stroke History: No Vascular Disease History: No Age Score: 2 Gender Score: 1      Physical Exam:    VS:  BP 140/62 (BP Location: Left Arm, Patient Position: Sitting, Cuff Size: Normal)   Pulse (!) 51   Ht 5' 3"  (1.6 m)   Wt 178 lb (80.7 kg)   LMP  (LMP Unknown)   SpO2 97%   BMI 31.53 kg/m     Wt Readings from Last 3 Encounters:  02/25/20 178 lb (80.7 kg)  02/24/20 178 lb 12.8 oz (81.1 kg)  02/18/20 179 lb 14.3 oz (81.6 kg)     GEN:  Well nourished, well developed in no acute distress HEENT: Normal NECK: No JVD; No carotid bruits LYMPHATICS: No lymphadenopathy CARDIAC: RR, bradcyardic, + murmur, no rubs, gallops RESPIRATORY:  Clear to auscultation without rales, wheezing or rhonchi  ABDOMEN: distended abdomen MUSCULOSKELETAL:  Mild lower leg edema; No deformity  SKIN: Warm and dry NEUROLOGIC:  Alert and oriented x  3 PSYCHIATRIC:  Normal  affect   ASSESSMENT:    1. Atrial fibrillation with RVR ([place])   2. NASH (nonalcoholic steatohepatitis)   3. AKI (acute kidney injury) ([place])   4. Symptomatic sinus bradycardia    PLAN:    In order of problems listed above:  Afib Recently admitted as outlined above. Now off Eliquis permanently given GIB. She is in SB today. CBC today.  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 No further bleeding reported. She saw GI yesterday. They are requesting nadolol for esophageal varices however with lower heart rates on Lopressor will need heart monitor to better guide therapy. Will discontinue Lopressor and start low dose coreg. She still has significant anasarca, especially in her abdomen. She is taking spironolactone and lasix. I will stop lasix and start Torsemide 18m daily. Patient needs to follow-up with GI for generally anasarca. Check BMET today and in 1 week to evaluate kidney function. CBC today to evaluate blood counts.  Bradycardia Patient is bradycardic on Lopressor 262mdaily. She denies symptoms. We will discontinue Metoprolol and start Coreg 3.12528mID. Also will order a heart monitor to evaluate average heart rates. This will better guide us [place] regards to starting Nadolol for esophageal varices. Given elevated rates in afib and suspected baseline bradycardia might need to see EP for tachy-brady syndrome.  Chronic diastolic heart failure Patient reports sob and abdominal fullness that is unchanged since discharge. Lungs clear on exam and minimal lower leg edema. Suspect abdominal fullness mostly due to GI issues. Will stop lasix and start torsemide as above. Continue spironolactone. Labs as above. Follow-up after heart monitor.Continue daily weights.   Pancytopenia Suspected 2/2 to cirrhosis. CBC today  HTN Mildly elevated. Stop lopressor and start coreg as outlined above.    Disposition: Follow  up in 1 month(s) with MD/APP after heart monitor  Shared Decision Making/Informed Consent        Signed, Ashyah Quizon H FArlyss Repress/14/2022 11:17 AM    [place]

## 2020-02-25 ENCOUNTER — Ambulatory Visit (INDEPENDENT_AMBULATORY_CARE_PROVIDER_SITE_OTHER): Payer: Medicare PPO

## 2020-02-25 ENCOUNTER — Ambulatory Visit (INDEPENDENT_AMBULATORY_CARE_PROVIDER_SITE_OTHER): Payer: Medicare PPO | Admitting: Medical

## 2020-02-25 ENCOUNTER — Other Ambulatory Visit: Payer: Self-pay

## 2020-02-25 ENCOUNTER — Encounter: Payer: Self-pay | Admitting: Physician Assistant

## 2020-02-25 VITALS — BP 140/62 | HR 51 | Ht 63.0 in | Wt 178.0 lb

## 2020-02-25 DIAGNOSIS — K922 Gastrointestinal hemorrhage, unspecified: Secondary | ICD-10-CM

## 2020-02-25 DIAGNOSIS — R001 Bradycardia, unspecified: Secondary | ICD-10-CM | POA: Diagnosis not present

## 2020-02-25 DIAGNOSIS — K7460 Unspecified cirrhosis of liver: Secondary | ICD-10-CM

## 2020-02-25 DIAGNOSIS — I4891 Unspecified atrial fibrillation: Secondary | ICD-10-CM

## 2020-02-25 DIAGNOSIS — K7581 Nonalcoholic steatohepatitis (NASH): Secondary | ICD-10-CM | POA: Diagnosis not present

## 2020-02-25 DIAGNOSIS — D61818 Other pancytopenia: Secondary | ICD-10-CM

## 2020-02-25 DIAGNOSIS — R188 Other ascites: Secondary | ICD-10-CM

## 2020-02-25 DIAGNOSIS — I5032 Chronic diastolic (congestive) heart failure: Secondary | ICD-10-CM

## 2020-02-25 DIAGNOSIS — I10 Essential (primary) hypertension: Secondary | ICD-10-CM

## 2020-02-25 MED ORDER — CARVEDILOL 3.125 MG PO TABS: 3.1250 mg | ORAL_TABLET | Freq: Two times a day (BID) | ORAL | 3 refills | Status: DC

## 2020-02-25 MED ORDER — TORSEMIDE 20 MG PO TABS: 20.0000 mg | ORAL_TABLET | Freq: Two times a day (BID) | ORAL | 3 refills | Status: DC

## 2020-02-25 NOTE — Patient Instructions (Signed)
Medication Instructions:  Your physician has recommended you make the following change in your medication:  1. STOP Metoprolol 2. STOP Furosemide (Lasix) 3. START Torsemide 20 mg once daily 4. START Carvedilol 3.125 mg twice a day   *If you need a refill on your cardiac medications before your next appointment, please call your pharmacy*   Lab Work: CBC, BMET today  In one week (Roughly January 21 st) we would like you to get repeat BMET done. No appointment is needed for this. Go to [facility] Entrance and check in at registration.    If you have labs (blood work) drawn today and your tests are completely normal, you will receive your results only by: Marland Kitchen MyChart Message (if you have MyChart) OR . A paper copy in the mail If you have any lab test that is abnormal or we need to change your treatment, we will call you to review the results.   Testing/Procedures: Place 2 week Zio Monitor  Your physician has recommended that you wear a Zio monitor. This monitor is a medical device that records the heart's electrical activity. Doctors most often use these monitors to diagnose arrhythmias. Arrhythmias are problems with the speed or rhythm of the heartbeat. The monitor is a small device applied to your chest. You can wear one while you do your normal daily activities. While wearing this monitor if you have any symptoms to push the button and record what you felt. Once you have worn this monitor for the period of time provider prescribed (Usually 14 days), you will return the monitor device in the postage paid box. Once it is returned they will download the data collected and provide [place] with a report which the provider will then review and we will call you with those results. Important tips:  1. Avoid showering during the first 24 hours of wearing the monitor. 2. Avoid excessive sweating to help maximize wear time. 3. Do not submerge the device, no hot tubs, and no swimming pools. 4. Keep  any lotions or oils away from the patch. 5. After 24 hours you may shower with the patch on. Take brief showers with your back facing the shower head.  6. Do not remove patch once it has been placed because that will interrupt data and decrease adhesive wear time. 7. Push the button when you have any symptoms and write down what you were feeling. 8. Once you have completed wearing your monitor, remove and place into box which has postage paid and place in your outgoing mailbox.  9. If for some reason you have misplaced your box then call our office and we can provide another box and/or mail it off for you.         Follow-Up: At [facility], you and your health needs are our priority.  As part of our continuing mission to provide you with exceptional heart care, we have created designated Provider Care Teams.  These Care Teams include your primary Cardiologist (physician) and Advanced Practice Providers (APPs -  Physician Assistants and Nurse Practitioners) who all work together to provide you with the care you need, when you need it.   Your next appointment:   1 month(s)  The format for your next appointment:   In Person  Provider:   You may see Nelva Bush, MD or one of the following Advanced Practice Providers on your designated Care Team:    Murray Hodgkins, NP  Christell Faith, PA-C  Marrianne Mood, PA-C  Cadence Kathlen Mody, PA-C  Laurann Montana, NP    Other Instructions Follow up with GI regarding abdominal swelling.

## 2020-02-26 LAB — BASIC METABOLIC PANEL
BUN/Creatinine Ratio: 12 (ref 12–28)
BUN: 10 mg/dL (ref 8–27)
CO2: 26 mmol/L (ref 20–29)
Calcium: 8.9 mg/dL (ref 8.7–10.3)
Chloride: 103 mmol/L (ref 96–106)
Creatinine, Ser: 0.81 mg/dL (ref 0.57–1.00)
GFR calc Af Amer: 80 mL/min/{1.73_m2} (ref >59)
GFR calc non Af Amer: 69 mL/min/{1.73_m2} (ref >59)
Glucose: 231 mg/dL — ABNORMAL HIGH (ref 65–99)
Potassium: 4.6 mmol/L (ref 3.5–5.2)
Sodium: 140 mmol/L (ref 134–144)

## 2020-02-26 LAB — CBC
Hematocrit: 31.9 % — ABNORMAL LOW (ref 34.0–46.6)
Hemoglobin: 10.6 g/dL — ABNORMAL LOW (ref 11.1–15.9)
MCH: 29.8 pg (ref 26.6–33.0)
MCHC: 33.2 g/dL (ref 31.5–35.7)
MCV: 90 fL (ref 79–97)
Platelets: 71 10*3/uL — CL (ref 150–450)
RBC: 3.56 x10E6/uL — ABNORMAL LOW (ref 3.77–5.28)
RDW: 13.1 % (ref 11.7–15.4)
WBC: 1.8 10*3/uL — CL (ref 3.4–10.8)

## 2020-02-28 ENCOUNTER — Telehealth: Payer: Self-pay | Admitting: Medical

## 2020-02-28 NOTE — Telephone Encounter (Signed)
Attempted to call the patient x 3 attempts.  Message stating the "call cannot be completed as dialed."  Will attempt to call back at a later time.

## 2020-02-28 NOTE — Telephone Encounter (Signed)
Cadence Ninfa Meeker, PA-C  02/28/2020 12:36 PM EST      Please call patient and inform her lab work was stable. Continue current diuretic. Also ask if fullness in her abdomen is improving any and if weights have gone down. Remind her to follow up with GI for abdominal fullness, especially if it doesn't improve.

## 2020-02-29 MED ORDER — TORSEMIDE 20 MG PO TABS: 20.0000 mg | ORAL_TABLET | Freq: Every day | ORAL | 0 refills | Status: DC

## 2020-02-29 NOTE — Telephone Encounter (Signed)
Reviewed results with patient and she reports her abdominal swelling has improved. She has also been doing daily weights.  02/25/20 178 lbs 02/26/20 174 lbs 02/25/20 172 lbs 02/25/20 170 lbs  She also wanted to confirm her medication. She states on her paperwork it has take it once a day and when she picked up her medication it had listed to take it twice a day. Reviewed chart and it is listed in providers note to take once a day, AVS has it listed to take it once a day, and prescription does have it listed to take it twice a day. Requested patient to write on bottle once a day and that I would call pharmacy to discontinue that prescription and will send in new one with correct instructions. Patient was appreciative for the call, results review, checking on daily weights, and had no further questions at this time. Will forward to providers for review and enter safety zone as well.

## 2020-03-02 ENCOUNTER — Other Ambulatory Visit
Admission: RE | Admit: 2020-03-02 | Discharge: 2020-03-02 | Disposition: A | Discharge status: Home / Self Care | Payer: Medicare PPO | Source: Ambulatory Visit | Attending: Physician Assistant | Admitting: Physician Assistant

## 2020-03-02 ENCOUNTER — Other Ambulatory Visit: Payer: Self-pay

## 2020-03-02 DIAGNOSIS — R001 Bradycardia, unspecified: Secondary | ICD-10-CM

## 2020-03-02 DIAGNOSIS — I4891 Unspecified atrial fibrillation: Secondary | ICD-10-CM | POA: Diagnosis present

## 2020-03-02 DIAGNOSIS — K7581 Nonalcoholic steatohepatitis (NASH): Secondary | ICD-10-CM | POA: Insufficient documentation

## 2020-03-02 LAB — BASIC METABOLIC PANEL
Anion gap: 11 (ref 5–15)
BUN: 15 mg/dL (ref 8–23)
CO2: 28 mmol/L (ref 22–32)
Calcium: 9.3 mg/dL (ref 8.9–10.3)
Chloride: 99 mmol/L (ref 98–111)
Creatinine, Ser: 0.7 mg/dL (ref 0.44–1.00)
GFR, Estimated: >60 mL/min (ref >60)
Glucose, Bld: 245 mg/dL — ABNORMAL HIGH (ref 70–99)
Potassium: 3.9 mmol/L (ref 3.5–5.1)
Sodium: 138 mmol/L (ref 135–145)

## 2020-03-03 ENCOUNTER — Telehealth: Payer: Self-pay | Admitting: *Deleted

## 2020-03-03 NOTE — Telephone Encounter (Signed)
-----   Message from [place], PA-C sent at 03/03/2020  3:22 PM EST ----- Please call patient and inform her labs look good. Please ask her about volume status, stable weights? Lower leg edema? Trouble laying flat?  Thanks

## 2020-03-03 NOTE — Telephone Encounter (Signed)
Spoke with patient and reviewed results with her. She states she is doing just fine and that the scales in our office are 10 lbs difference. She reports weight today at 158 lbs, no swelling, and she did say that she has not slept flat in years so that has not changed. She also states her abdominal swelling has improved significantly.Advised I would update provider and she had no further questions at this time. Instructed her to please call back if any further concerns.

## 2020-03-07 ENCOUNTER — Telehealth: Payer: Self-pay | Admitting: Gastroenterology

## 2020-03-07 NOTE — Telephone Encounter (Signed)
Patient was told by her cardiology to see Dr. Allen Norris asap. She didn't know why??? She was just seen this month. Do I need to get her in again?

## 2020-03-07 NOTE — Telephone Encounter (Signed)
Nothing in the notes says ASAP and we should see her in a month to see how the medications are working.

## 2020-03-07 NOTE — Telephone Encounter (Signed)
Dr. Allen Norris, please review the cardiologist's plan on the office visit of 02/25/20.   NASH/cirrhosis with anasarca She is of anticoagulation indefinitely. No further bleeding reported. She saw GI yesterday. They are requesting nadolol for esophageal varices however with lower heart rates on Lopressor will need heart monitor to better guide therapy. Will discontinue Lopressor and start low dose coreg. She still has significant anasarca, especially in her abdomen. She is taking spironolactone and lasix. I will stop lasix and start Torsemide 20mg  daily. Patient needs to follow-up with GI for generally anasarca. Check BMET today and in 1 week to evaluate kidney function. CBC today to evaluate blood counts.  They are recommending her to follow up with you ASAP due to above. You saw her in clinic on 02/24/20.  Please advise if follow up is needed.

## 2020-03-14 ENCOUNTER — Other Ambulatory Visit: Payer: Self-pay

## 2020-03-14 ENCOUNTER — Ambulatory Visit (INDEPENDENT_AMBULATORY_CARE_PROVIDER_SITE_OTHER): Payer: Medicare PPO | Admitting: Obstetrics and Gynecology

## 2020-03-14 ENCOUNTER — Encounter: Payer: Self-pay | Admitting: Obstetrics and Gynecology

## 2020-03-14 VITALS — BP 159/65 | HR 57 | Ht 63.0 in | Wt 162.3 lb

## 2020-03-14 DIAGNOSIS — N9089 Other specified noninflammatory disorders of vulva and perineum: Secondary | ICD-10-CM | POA: Diagnosis not present

## 2020-03-14 DIAGNOSIS — B373 Candidiasis of vulva and vagina: Secondary | ICD-10-CM | POA: Diagnosis not present

## 2020-03-14 DIAGNOSIS — B3731 Acute candidiasis of vulva and vagina: Secondary | ICD-10-CM

## 2020-03-14 MED ORDER — FLUCONAZOLE 150 MG PO TABS: 150.0000 mg | ORAL_TABLET | ORAL | 3 refills | Status: DC

## 2020-03-14 MED ORDER — ACYCLOVIR 400 MG PO TABS: 400.0000 mg | ORAL_TABLET | Freq: Three times a day (TID) | ORAL | 0 refills | Status: DC

## 2020-03-14 NOTE — Addendum Note (Signed)
Addended by: Durwin Glaze on: 03/14/2020 11:23 AM   Modules accepted: Orders

## 2020-03-14 NOTE — Progress Notes (Signed)
HPI:      [name] is a 80 y.o. [phone] who LMP was No LMP recorded (lmp unknown). Patient has had a hysterectomy.  Subjective:   She presents today with a 5-day history of vulvar burning and what she thinks might be a boil on her left labia.  She says she previously had a Bartholin cyst but is not exactly like that.  Complains of generalized pain when sitting and especially when urinating. 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 Of significant note patient has recently gone through hospitalization for A. fib and subsequent congestive heart failure.  She was placed on an anticoagulant and had a spontaneous GI bleed.  She was hospitalized and her anticoagulation was discontinued. She reports no previous history of HSV.  She is not currently sexually active.    Hx: The following portions of the patient's history were reviewed and updated as appropriate:             She  has a past medical history of Abdominal pain, Allergy, Cancer ([place]) (2017), Cataract, CHF (congestive heart failure) ([place]), Collagenous colitis, Diabetes mellitus without complication ([place]), Diarrhea, Diverticulosis, Fatty liver disease, nonalcoholic, Fibrocystic breast, GERD (gastroesophageal reflux disease), Heart murmur, Hyperlipidemia, Hypertension, Hypothyroidism, IBS (irritable bowel syndrome), IDA (iron deficiency anemia), Personal history of radiation therapy (2017), PONV (postoperative nausea and vomiting), and Sleep apnea. She does not have any pertinent problems on file. She  has a past surgical history that includes Cardiac surgery; Abdominal surgery; Cholecystectomy; left sinusplasty ; Joint replacement (Left); Cataract extraction (Right); Partial mastectomy with needle localization (Left, 09/29/2015); Mastectomy, partial (Left, 10/17/2015); Total abdominal hysterectomy w/ bilateral salpingoophorectomy; Appendectomy; Colonoscopy with propofol (N/A, 03/11/2016);  Esophagogastroduodenoscopy (egd) with propofol (N/A, 03/11/2016); Abdominal hysterectomy; Hardware Removal (Right, 09/03/2018); Ganglion cyst excision (Right, 09/03/2018); Finger arthroscopy with carpometacarpel(cmc) arthroplasty (Right, 09/03/2018); Breast biopsy (Bilateral, 2016); Breast biopsy (Left, 09/11/2015); Breast biopsy (Left, 05/27/2016); Breast biopsy (Left, 11/20/2017); Breast lumpectomy (Left, 10/17/2015); Esophagogastroduodenoscopy (egd) with propofol (N/A, 02/18/2020); and Colonoscopy with propofol (N/A, 02/18/2020). Her family history includes Bladder Cancer in her brother; Breast cancer in her paternal grandmother; Colon cancer in her father and maternal uncle; Diabetes in her brother and sister; Heart disease in her brother; Leukemia in her mother; Prostate cancer in her brother and brother. She  reports that she has never smoked. She has never used smokeless tobacco. She reports that she does not drink alcohol and does not use drugs. She has a current medication list which includes the following prescription(s): acyclovir, azelastine, benzonatate, carvedilol, vitamin d, clobetasol ointment, cyanocobalamin, desloratadine, dicyclomine, docusate sodium, epinephrine, escitalopram, esomeprazole, estradiol, fenofibrate, fexofenadine, fluconazole, fluticasone, gabapentin, glimepiride, hydroxyzine, hyoscyamine sulfate, tresiba, levothyroxine, magnesium oxide, metformin, novolog flexpen, ondansetron, potassium chloride sa, saccharomyces boulardii, semaglutide(0.25 or 0.5m/dos), simvastatin, spironolactone, torsemide, tramadol, and valacyclovir. She is allergic to codeine, demeclocycline, demerol [meperidine], hydrocodone, oxycodone, pentazocine, tetracyclines & related, and fentanyl.       Review of Systems:  Review of Systems  Constitutional: Denied constitutional symptoms, night sweats, recent illness, fatigue, fever, insomnia and weight loss.  Eyes: Denied eye symptoms, eye pain, photophobia, vision  change and visual disturbance.  Ears/Nose/Throat/Neck: Denied ear, nose, throat or neck symptoms, hearing loss, nasal discharge, sinus congestion and sore throat.  Cardiovascular: Denied cardiovascular symptoms, arrhythmia, chest pain/pressure, edema, exercise intolerance, orthopnea and palpitations.  Respiratory: Denied pulmonary symptoms, asthma, pleuritic pain, productive sputum, cough, dyspnea and wheezing.  Gastrointestinal: Denied, gastro-esophageal reflux, melena, nausea and vomiting.  Genitourinary:  See HPI for additional information.  Musculoskeletal: Denied musculoskeletal symptoms, stiffness, swelling, muscle weakness and myalgia.  Dermatologic: Denied dermatology symptoms, rash and scar.  Neurologic: Denied neurology symptoms, dizziness, headache, neck pain and syncope.  Psychiatric: Denied psychiatric symptoms, anxiety and depression.  Endocrine: Denied endocrine symptoms including hot flashes and night sweats.   Meds:   Current Outpatient Medications on File Prior to Visit  Medication Sig Dispense Refill  . azelastine (OPTIVAR) 0.05 % ophthalmic solution 1 drop into affected eye    . benzonatate (TESSALON) 200 MG capsule Take 200 mg by mouth 3 (three) times daily as needed for cough.    . carvedilol (COREG) 3.125 MG tablet Take 1 tablet (3.125 mg total) by mouth 2 (two) times daily. 180 tablet 3  . Cholecalciferol (VITAMIN D) 50 MCG (2000 UT) CAPS Take 2,000 Units by mouth daily.    . clobetasol ointment (TEMOVATE) 7.25 % Apply 1 application topically 2 (two) times a week. 45 g 1  . cyanocobalamin (,VITAMIN B-12,) 1000 MCG/ML injection 1,000 mcg every 30 (thirty) days.    Marland Kitchen desloratadine (CLARINEX) 5 MG tablet Take 1 tablet by mouth daily.    Marland Kitchen dicyclomine (BENTYL) 10 MG capsule Take 10 mg by mouth at bedtime.     . docusate sodium (COLACE) 100 MG capsule Take 1 capsule by mouth 2 (two) times daily as needed.    Marland Kitchen EPINEPHrine 0.3 mg/0.3 mL IJ SOAJ injection Inject 0.3 mg into  the muscle as needed.    Marland Kitchen escitalopram (LEXAPRO) 5 MG tablet Take 5 mg by mouth at bedtime.    Marland Kitchen esomeprazole (NEXIUM) 40 MG capsule Take 40 mg by mouth 2 (two) times daily before a meal.     . estradiol (ESTRACE) 0.1 MG/GM vaginal cream Place 1 Applicatorful vaginally as needed.    . fenofibrate 160 MG tablet Take 160 mg by mouth at bedtime.    . fexofenadine (ALLEGRA) 180 MG tablet 1 tablet    . fluticasone (FLONASE) 50 MCG/ACT nasal spray Place 1 spray into the nose daily as needed for allergies.    Marland Kitchen gabapentin (NEURONTIN) 300 MG capsule Take 300 mg by mouth at bedtime.     Marland Kitchen glimepiride (AMARYL) 2 MG tablet Take 2 mg by mouth at bedtime.    . hydrOXYzine (ATARAX/VISTARIL) 25 MG tablet 1 tablet as needed    . Hyoscyamine Sulfate 0.375 MG TBCR Take 0.375 mg by mouth 2 (two) times daily.     . Insulin Degludec (TRESIBA) 100 UNIT/ML SOLN Inject 50 mg into the skin at bedtime.    Marland Kitchen levothyroxine (SYNTHROID, LEVOTHROID) 75 MCG tablet Take 75 mcg by mouth daily before breakfast.     . magnesium oxide (MAG-OX) 400 MG tablet Take 400 mg by mouth 2 (two) times daily.     . metFORMIN (GLUCOPHAGE-XR) 750 MG 24 hr tablet Take 750 mg by mouth daily with breakfast.     . NOVOLOG FLEXPEN 100 UNIT/ML FlexPen Inject 15 Units into the skin 3 (three) times daily with meals. Based on sliding scale    . ondansetron (ZOFRAN) 4 MG tablet Take 4 mg by mouth every 8 (eight) hours as needed for nausea or vomiting.  1  . potassium chloride SA (K-DUR,KLOR-CON) 20 MEQ tablet Take 20 mEq by mouth 2 (two) times daily.     Marland Kitchen saccharomyces boulardii (FLORASTOR) 250 MG capsule Take 250 mg by mouth 2 (two) times daily.     . Semaglutide,0.25 or 0.5MG/DOS, 2 MG/1.5ML SOPN Inject 0.5  mg into the skin once a week. On Sunday.    . simvastatin (ZOCOR) 20 MG tablet Take 20 mg by mouth at bedtime.    Marland Kitchen spironolactone (ALDACTONE) 25 MG tablet Take 1 tablet (25 mg total) by mouth daily. 30 tablet 3  . torsemide (DEMADEX) 20 MG  tablet Take 1 tablet (20 mg total) by mouth daily. 90 tablet 0  . traMADol (ULTRAM) 50 MG tablet 1 tablet as needed    . valACYclovir (VALTREX) 500 MG tablet Take 500 mg by mouth See admin instructions. For fever blisters take 581m twice a day as needed  1   No current facility-administered medications on file prior to visit.          Objective:     Vitals:   03/14/20 1001  BP: (!) 159/65  Pulse: (!) 57   Filed Weights   03/14/20 1001  Weight: 162 lb 4.8 oz (73.6 kg)              Physical examination   Pelvic:  Vulva:  Small slightly edematous ulcerated lesion on the left labia -appears most consistent with HSV. She additionally has some erythema along the anterior edges of labia and up around the clitoral hood -appearance of chronic monilia vulvitis.  Vagina: No lesions or abnormalities noted.  Support: Normal pelvic support.  Urethra No masses tenderness or scarring.  Meatus Normal size without lesions or prolapse.  Cervix: Normal appearance.  No lesions.  Anus: Normal exam.  No lesions.  Perineum: Normal exam.  No lesions.     Assessment:    [id number] Active Problem List   Diagnosis Date Noted  . Allergic rhinitis due to pollen 02/24/2020  . Chronic allergic conjunctivitis 02/24/2020  . Allergic rhinitis due to animal (cat) (dog) hair and dander 02/24/2020  . Allergic rhinitis 02/24/2020  . Polyp of transverse colon   . Hematochezia 02/17/2020  . Chronic anticoagulation 02/17/2020  . Anasarca 02/17/2020  . Symptomatic sinus bradycardia 02/17/2020  . Unspecified [place] fibrillation [place] 02/17/2020  . AKI ([place] kidney injury) [place] 02/17/2020  . History of colitis 02/17/2020  . Acute GI bleeding   . Atrial fibrillation with RVR ([place] 02/07/2020  . NASH (nonalcoholic steatohepatitis)   . Hav (hallux abducto valgus), unspecified laterality 06/03/2019  . Acquired bilateral hammer toes 06/03/2019  . Iron deficiency 04/26/2019  . Right-sided chest  wall pain 04/12/2019  . Mid back pain on right side 04/12/2019  . 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 Recurrent UTI (urinary tract infection) 07/23/2017  . Nausea vomiting and diarrhea 01/11/2017  . Hypothyroidism 01/11/2017  . HTN (hypertension) 01/11/2017  . Diabetes ([place] 01/11/2017  . GERD (gastroesophageal reflux disease) 01/11/2017  . Other pancytopenia ([place] 11/14/2016  . 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 Status post hysterectomy 06/20/2016  . 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 Vaginal atrophy 01/12/2016  . 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 Monilial vulvovaginitis   2. Vulvar lesion     Patient significantly symptomatic with vulvar ulcerative lesion.  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  Possibly from recent hospitalization stress?   Plan:            1.  We will presumptively treat with Zovirax I have discussed the natural course and history of HSV with the patient.  She understands this is only presumptive treatment and not proof of diagnosis.  HSV cultures performed  2.  3-week treatment using Diflucan for chronic yeast vulvitis. Orders No orders of the defined types were placed in this encounter.    Meds ordered this encounter  Medications  . acyclovir (ZOVIRAX) 400 MG tablet    Sig: Take 1 tablet (400 mg total) by mouth 3 (three) times daily. For 10 days    Dispense:  30 tablet    Refill:  0  . fluconazole (DIFLUCAN) 150 MG tablet    Sig: Take 1 tablet (150 mg total) by mouth every 3 (three) days. For three doses    Dispense:  3 tablet    Refill:  3      F/U  Return for We will contact her with any abnormal test results.  Finis Bud, M.D. 03/14/2020 10:37 AM

## 2020-03-16 LAB — HERPES SIMPLEX VIRUS CULTURE

## 2020-03-17 ENCOUNTER — Telehealth: Payer: Self-pay

## 2020-03-17 ENCOUNTER — Telehealth: Payer: Self-pay | Admitting: *Deleted

## 2020-03-17 NOTE — Telephone Encounter (Signed)
Spoke with patient and reviewed results and recommendations. She verbalized understanding with no further questions at this time.

## 2020-03-17 NOTE — Telephone Encounter (Signed)
-----   Message from [place], PA-C sent at 03/17/2020  1:12 PM EST ----- Please call patient and tell her heart monitor showed normal sinus rhythm with a low rate, average 55bpm (a little low). Also she had occasional PACs, PVCs (these are rare irregular beats) and fast atrial runs. She had a single episode of NSVT lasting 6 beats (insignificant). No significant sustained arrhythmia or pause. When patient triggered events they corresponded to PACs and PVCs. She is already on a BB, which is can be used to improve many of the above issues. Due to bradycardia we cannot increase BB. In regards to Nadolol requested by GI, she will not tolerate the lowest dose given baseline bradycardia on lowest dose of coreg.

## 2020-03-17 NOTE — Telephone Encounter (Signed)
mychart message sent to patient with Dr Amalia Hailey instructions.

## 2020-03-17 NOTE — Telephone Encounter (Signed)
Left voicemail message to call back to review results.

## 2020-04-03 ENCOUNTER — Ambulatory Visit: Payer: Medicare PPO | Admitting: Gastroenterology

## 2020-04-05 ENCOUNTER — Ambulatory Visit (INDEPENDENT_AMBULATORY_CARE_PROVIDER_SITE_OTHER): Payer: Medicare PPO | Admitting: Internal Medicine

## 2020-04-05 ENCOUNTER — Other Ambulatory Visit: Payer: Self-pay

## 2020-04-05 ENCOUNTER — Encounter: Payer: Self-pay | Admitting: Internal Medicine

## 2020-04-05 VITALS — BP 120/50 | HR 50 | Ht 63.0 in | Wt 161.0 lb

## 2020-04-05 DIAGNOSIS — I480 Paroxysmal atrial fibrillation: Secondary | ICD-10-CM

## 2020-04-05 DIAGNOSIS — K922 Gastrointestinal hemorrhage, unspecified: Secondary | ICD-10-CM | POA: Diagnosis not present

## 2020-04-05 DIAGNOSIS — I5032 Chronic diastolic (congestive) heart failure: Secondary | ICD-10-CM | POA: Diagnosis not present

## 2020-04-05 DIAGNOSIS — K7460 Unspecified cirrhosis of liver: Secondary | ICD-10-CM | POA: Diagnosis not present

## 2020-04-05 DIAGNOSIS — R188 Other ascites: Secondary | ICD-10-CM

## 2020-04-05 DIAGNOSIS — R9431 Abnormal electrocardiogram [ECG] [EKG]: Secondary | ICD-10-CM

## 2020-04-05 NOTE — Patient Instructions (Signed)
Medication Instructions:  Your physician has recommended you make the following change in your medication:  1- STOP Carvedilol.  *If you need a refill on your cardiac medications before your next appointment, please call your pharmacy*  Follow-Up:  You have been referred to Electrophysiology for evaluation of atrial fibrillation.   At [facility], you and your health needs are our priority.  As part of our continuing mission to provide you with exceptional heart care, we have created designated Provider Care Teams.  These Care Teams include your primary Cardiologist (physician) and Advanced Practice Providers (APPs -  Physician Assistants and Nurse Practitioners) who all work together to provide you with the care you need, when you need it.  We recommend signing up for the patient portal called "MyChart".  Sign up information is provided on this After Visit Summary.  MyChart is used to connect with patients for Virtual Visits (Telemedicine).  Patients are able to view lab/test results, encounter notes, upcoming appointments, etc.  Non-urgent messages can be sent to your provider as well.   To learn more about what you can do with MyChart, go to [email or web address].    Your next appointment:   6 week(s)  The format for your next appointment:   In Person  Provider:   You may see Nelva Bush, MD or one of the following Advanced Practice Providers on your designated Care Team:    Murray Hodgkins, NP  Christell Faith, PA-C  Marrianne Mood, PA-C  Cadence Mulberry, [place]  Laurann [place], NP

## 2020-04-05 NOTE — Progress Notes (Signed)
Follow-up Outpatient Visit Date: 04/05/2020  Primary Care Provider: Albina Billet, MD [address]   [place] [address]  Chief Complaint: Follow-up atrial fibrillation  HPI:  Annette Foster is a 80 y.o. female with history of remote valve surgery in the 1970s, HFpEF, paroxysmal atrial fibrillation complicated by GI bleed on anticoagulation, hypertension, hyperlipidemia, Karlene Lineman, seizure disorder, OSA, and left breast cancer status postmastectomy, who presents for follow-up of atrial fibrillation.  She was last seen in the office a month ago, at which time she reported shortness of breath after her most recent hospitalization in early January.  She also noted abdominal fullness as well as leg edema.  Acute GI had asked to begin nadolol given history of esophageal varices, though trial of low-dose carvedilol was pursued instead to ensure that the patient did not develop significant bradycardia, as this had been a problem in the past.  She was noted to be in sinus bradycardia with heart rate of 51 bpm at that time.  Today, Annette Foster reports that she is fatigued.  She denies chest pain, palpitations, lightheadedness, edema, and bleeding.  She has exertional dyspnea when she tries to walk quickly.  She does not feel any different since transitioning from metoprolol to carvedilol at her last visit.  She brings up the possibility of Watchman as an alternative to anticoagulation in the setting of her paroxysmal atrial fibrillation complicated by GI bleeding.  --------------------------------------------------------------------------------------------------  Past Medical History:  Diagnosis Date  . Abdominal pain   . Allergy   . Cancer [facility]) 2017   breast cancer- Left  . Cataract   . CHF (congestive heart failure) ([place])   . Collagenous colitis   . Diabetes mellitus without complication ([place])   . Diarrhea   . Diverticulosis   . Fatty liver disease, nonalcoholic   . Fibrocystic breast   .  GERD (gastroesophageal reflux disease)   . Heart murmur   . Hyperlipidemia   . Hypertension   . Hypothyroidism   . IBS (irritable bowel syndrome)   . IDA (iron deficiency anemia)   . Personal history of radiation therapy 2017   LEFT BREAST CA  . PONV (postoperative nausea and vomiting)   . Sleep apnea    C-Pap   Past Surgical History:  Procedure Laterality Date  . ABDOMINAL HYSTERECTOMY     [place] bso  . ABDOMINAL SURGERY    . APPENDECTOMY    . BREAST BIOPSY Bilateral 2016   negative  . 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 BREAST BIOPSY Left 05/27/2016   BENIGN MAMMARY EPITHELIUM  . BREAST BIOPSY Left 11/20/2017   affirm bx x clip BENIGN MAMMARY EPITHELIUM CONSISTENT WITH RAD THERAPY  . BREAST LUMPECTOMY Left 10/17/2015   DCIS and papillary carcinoma insitu, clear margins  . CARDIAC SURGERY     has replacement valve  . CATARACT EXTRACTION Right   . CHOLECYSTECTOMY    . COLONOSCOPY WITH PROPOFOL N/A 03/11/2016   Procedure: COLONOSCOPY WITH PROPOFOL;  Surgeon: Manya Silvas, MD;  Location: [facility] ENDOSCOPY;  Service: Endoscopy;  Laterality: N/A;  . COLONOSCOPY WITH PROPOFOL N/A 02/18/2020   Procedure: COLONOSCOPY WITH PROPOFOL;  Surgeon: [name], MD;  Location: [facility] ENDOSCOPY;  Service: Endoscopy;  Laterality: N/A;  . ESOPHAGOGASTRODUODENOSCOPY (EGD) WITH PROPOFOL N/A 03/11/2016   Procedure: ESOPHAGOGASTRODUODENOSCOPY (EGD) WITH PROPOFOL;  Surgeon: Manya Silvas, MD;  Location[facility] ENDOSCOPY;  Service: Endoscopy;  Laterality: N/A;  . ESOPHAGOGASTRODUODENOSCOPY (EGD) WITH PROPOFOL  N/A 02/18/2020   Procedure: ESOPHAGOGASTRODUODENOSCOPY (EGD) WITH PROPOFOL;  Surgeon: [name], MD;  Location: [facility] ENDOSCOPY;  Service: Endoscopy;  Laterality: N/A;  . FINGER ARTHROSCOPY WITH CARPOMETACARPEL ([place]) ARTHROPLASTY Right 09/03/2018   Procedure: CARPOMETACARPEL [facility]) ARTHROPLASTY RIGHT THUMB;  Surgeon: Hessie Knows, MD;  Location: [facility] ORS;  Service:  Orthopedics;  Laterality: Right;  . GANGLION CYST EXCISION Right 09/03/2018   Procedure: REMOVAL GANGLION OF WRIST;  Surgeon: Hessie Knows, MD;  Location: [facility] ORS;  Service: Orthopedics;  Laterality: Right;  . HARDWARE REMOVAL Right 09/03/2018   Procedure: HARDWARE REMOVAL RIGHT THUMB;  Surgeon: Hessie Knows, MD;  Location: [facility] ORS;  Service: Orthopedics;  Laterality: Right;  staple removed  . JOINT REPLACEMENT Left    TKR  . left sinusplasty     . MASTECTOMY, PARTIAL Left 10/17/2015   Procedure: MASTECTOMY PARTIAL REVISION;  Surgeon: Leonie Green, MD;  Location: [facility] ORS;  Service: General;  Laterality: Left;  . PARTIAL MASTECTOMY WITH NEEDLE LOCALIZATION Left 09/29/2015   Procedure: PARTIAL MASTECTOMY WITH NEEDLE LOCALIZATION;  Surgeon: Leonie Green, MD;  Location: [facility] ORS;  Service: General;  Laterality: Left;  . TOTAL ABDOMINAL HYSTERECTOMY W/ BILATERAL SALPINGOOPHORECTOMY      Current Meds  Medication Sig  . acyclovir (ZOVIRAX) 400 MG tablet Take 1 tablet (400 mg total) by mouth 3 (three) times daily. For 10 days  . azelastine (OPTIVAR) 0.05 % ophthalmic solution 1 drop into affected eye  . benzonatate (TESSALON) 200 MG capsule Take 200 mg by mouth 3 (three) times daily as needed for cough.  . carvedilol (COREG) 3.125 MG tablet Take 1 tablet (3.125 mg total) by mouth 2 (two) times daily.  . Cholecalciferol (VITAMIN D) 50 MCG (2000 UT) CAPS Take 2,000 Units by mouth daily.  . clobetasol ointment (TEMOVATE) 2.42 % Apply 1 application topically 2 (two) times a week.  . cyanocobalamin (,VITAMIN B-12,) 1000 MCG/ML injection 1,000 mcg every 30 (thirty) days.  Marland Kitchen desloratadine (CLARINEX) 5 MG tablet Take 1 tablet by mouth daily.  Marland Kitchen dicyclomine (BENTYL) 10 MG capsule Take 10 mg by mouth at bedtime.   . docusate sodium (COLACE) 100 MG capsule Take 1 capsule by mouth 2 (two) times daily as needed.  Marland Kitchen EPINEPHrine 0.3 mg/0.3 mL IJ SOAJ injection Inject 0.3 mg into the muscle as  needed.  Marland Kitchen escitalopram (LEXAPRO) 5 MG tablet Take 5 mg by mouth at bedtime.  Marland Kitchen esomeprazole (NEXIUM) 40 MG capsule Take 40 mg by mouth 2 (two) times daily before a meal.   . estradiol (ESTRACE) 0.1 MG/GM vaginal cream Place 1 Applicatorful vaginally as needed.  . fenofibrate 160 MG tablet Take 160 mg by mouth at bedtime.  . fexofenadine (ALLEGRA) 180 MG tablet 1 tablet  . fluconazole (DIFLUCAN) 150 MG tablet Take 1 tablet (150 mg total) by mouth every 3 (three) days. For three doses  . fluticasone (FLONASE) 50 MCG/ACT nasal spray Place 1 spray into the nose daily as needed for allergies.  Marland Kitchen gabapentin (NEURONTIN) 300 MG capsule Take 300 mg by mouth at bedtime.   Marland Kitchen glimepiride (AMARYL) 2 MG tablet Take 2 mg by mouth at bedtime.  . hydrOXYzine (ATARAX/VISTARIL) 25 MG tablet 1 tablet as needed  . Hyoscyamine Sulfate 0.375 MG TBCR Take 0.375 mg by mouth 2 (two) times daily.   . Insulin Degludec (TRESIBA) 100 UNIT/ML SOLN Inject 54 mg into the skin at bedtime.  Marland Kitchen levothyroxine (SYNTHROID, LEVOTHROID) 75 MCG tablet Take 75 mcg by mouth daily before breakfast.   .  magnesium oxide (MAG-OX) 400 MG tablet Take 400 mg by mouth 2 (two) times daily.   Marland Kitchen NOVOLOG FLEXPEN 100 UNIT/ML FlexPen Inject 15 Units into the skin 3 (three) times daily with meals. Based on sliding scale  . ondansetron (ZOFRAN) 4 MG tablet Take 4 mg by mouth every 8 (eight) hours as needed for nausea or vomiting.  . potassium chloride SA (K-DUR,KLOR-CON) 20 MEQ tablet Take 20 mEq by mouth 2 (two) times daily.   Marland Kitchen saccharomyces boulardii (FLORASTOR) 250 MG capsule Take 250 mg by mouth 2 (two) times daily.   . Semaglutide,0.25 or 0.5MG/DOS, 2 MG/1.5ML SOPN Inject 0.5 mg into the skin once a week. On Sunday.  . simvastatin (ZOCOR) 20 MG tablet Take 20 mg by mouth at bedtime.  Marland Kitchen spironolactone (ALDACTONE) 25 MG tablet Take 1 tablet (25 mg total) by mouth daily.  Marland Kitchen torsemide (DEMADEX) 20 MG tablet Take 1 tablet (20 mg total) by mouth daily.   . traMADol (ULTRAM) 50 MG tablet 1 tablet as needed  . valACYclovir (VALTREX) 500 MG tablet Take 500 mg by mouth See admin instructions. For fever blisters take 536m twice a day as needed    Allergies: Codeine, Demeclocycline, Demerol [meperidine], Hydrocodone, Oxycodone, Pentazocine, Tetracyclines & related, and Fentanyl  Social History   Tobacco Use  . Smoking status: Never Smoker  . Smokeless tobacco: Never Used  Vaping Use  . Vaping Use: Never used  Substance Use Topics  . Alcohol use: No    Alcohol/week: 0.0 standard drinks  . Drug use: No    Family History  Problem Relation Age of Onset  . Breast cancer Paternal Grandmother   . Colon cancer Father   . Diabetes Sister   . Diabetes Brother   . Heart disease Brother   . Prostate cancer Brother   . Colon cancer Maternal Uncle   . Prostate cancer Brother   . Bladder Cancer Brother   . Leukemia Mother        all  . Ovarian cancer Neg Hx   . Kidney cancer Neg Hx     Review of Systems: A 12-system review of systems was performed and was negative except as noted in the HPI.  --------------------------------------------------------------------------------------------------  Physical Exam: BP (!) 120/50 (BP Location: Left Arm, Patient Position: Sitting, Cuff Size: Normal)   Pulse (!) 50   Ht 5' 3"  (1.6 m)   Wt 161 lb (73 kg)   LMP  (LMP Unknown)   SpO2 97%   BMI 28.52 kg/m   General:  NAD. Neck: No JVD or HJR. Lungs: Clear to auscultation bilaterally without wheezes or crackles. Heart: Bradycardic but regular with 2/6 systolic murmur.  No rubs or gallops. Abdomen: Soft, nontender, nondistended. Extremities: No lower extremity edema.  EKG: Sinus bradycardia with LVH and anterolateral ST/T changes, unchanged from 02/25/2020.  Lab Results  Component Value Date   WBC 1.8 (LL) 02/25/2020   HGB 10.6 (L) 02/25/2020   HCT 31.9 (L) 02/25/2020   MCV 90 02/25/2020   PLT 71 (LL) 02/25/2020    Lab Results   Component Value Date   NA 138 03/02/2020   K 3.9 03/02/2020   CL 99 03/02/2020   CO2 28 03/02/2020   BUN 15 03/02/2020   CREATININE 0.70 03/02/2020   GLUCOSE 245 (H) 03/02/2020   ALT 16 02/18/2020    No results found for: CHOL, HDL, LDLCALC, LDLDIRECT, TRIG, CHOLHDL  --------------------------------------------------------------------------------------------------  ASSESSMENT AND PLAN: Paroxysmal atrial fibrillation: Annette Foster maintaining sinus rhythm and  has not had any symptoms to suggest recurrent atrial fibrillation.  Her heart rate is 50 on minimal dose of carvedilol.  I am concerned that her fatigue that began around the time of her hospitalization for atrial fibrillation in 01/2020 may be exacerbated by bradycardia.  I have recommended discontinuation of carvedilol.  If her symptoms do not improve, we could consider restarting a beta-blocker, including a low-dose nadolol, as previously recommended by Dr. Allen Norris in the setting of esophageal varices.  We will defer anticoagulation given recent GI bleed shortly after starting apixaban.  I will refer Annette Foster to Dr. Quentin Ore for further management of her atrial fibrillation, particularly concerned about tachy-brady syndrome and discussion of left atrial appendage occlusion.  I will reach out to Dr. Allen Norris to inquire about the safety of transesophageal echocardiography that would be needed for watchman placement in the setting of the patient's cirrhosis and esophageal varices.  Chronic HFrEF: Annette Foster appears euvolemic with stable NYHA class II symptoms.  Continue current dose of torsemide and potassium.  We will discontinue carvedilol, as above.  Cirrhosis and GI bleed: Patient is not on any antiplatelet or antithrombotic therapy at this time.  Ongoing management per GI.  Abnormal EKG: EKG today again shows sinus bradycardia with anterolateral ST/T changes that date back to at least 2017.  I suspect these are most likely due to  abnormal repolarization though underlying ischemic heart disease cannot be excluded.  Given that Annette Foster has not had any angina and would also be a poor candidate for antiplatelet therapy in the setting of her GI bleeding and pancytopenia, I do not recommend proceeding with ischemia work-up at this time.  Follow-up: Return to clinic in 6 weeks.  Nelva Bush, MD 04/05/2020 2:48 PM

## 2020-04-06 ENCOUNTER — Telehealth: Payer: Self-pay | Admitting: *Deleted

## 2020-04-06 ENCOUNTER — Telehealth: Payer: Self-pay | Admitting: Internal Medicine

## 2020-04-06 NOTE — Telephone Encounter (Signed)
Pt is stating her heart MD said she needed to try and get in earlier to see [place] for some liver issues. We are only seeing pt venofer so not sure if we need to move it up from April. Please advise

## 2020-04-06 NOTE — Telephone Encounter (Signed)
Md would like to see patient first and then order labs

## 2020-04-06 NOTE — Telephone Encounter (Signed)
Annette Foster- we can see the patient tomorrow afternoon. Labs at 2pm and md visit at 2:30pm.  Dr. Jacinto Reap- please advise on labs.  Patient has worsening pancytopenia. H/o recent GI bleed per cardiology note.

## 2020-04-07 ENCOUNTER — Inpatient Hospital Stay: Payer: Medicare PPO

## 2020-04-07 ENCOUNTER — Other Ambulatory Visit: Payer: Self-pay

## 2020-04-07 ENCOUNTER — Inpatient Hospital Stay (HOSPITAL_BASED_OUTPATIENT_CLINIC_OR_DEPARTMENT_OTHER): Payer: Medicare PPO | Admitting: Internal Medicine

## 2020-04-07 ENCOUNTER — Inpatient Hospital Stay: Payer: Medicare PPO | Attending: Internal Medicine

## 2020-04-07 VITALS — BP 117/68 | HR 60 | Temp 98.8°F | Resp 18

## 2020-04-07 DIAGNOSIS — Z800 Family history of malignant neoplasm of digestive organs: Secondary | ICD-10-CM | POA: Insufficient documentation

## 2020-04-07 DIAGNOSIS — Z86000 Personal history of in-situ neoplasm of breast: Secondary | ICD-10-CM | POA: Diagnosis not present

## 2020-04-07 DIAGNOSIS — Z8052 Family history of malignant neoplasm of bladder: Secondary | ICD-10-CM | POA: Insufficient documentation

## 2020-04-07 DIAGNOSIS — K922 Gastrointestinal hemorrhage, unspecified: Secondary | ICD-10-CM | POA: Insufficient documentation

## 2020-04-07 DIAGNOSIS — D649 Anemia, unspecified: Secondary | ICD-10-CM | POA: Diagnosis not present

## 2020-04-07 DIAGNOSIS — K7581 Nonalcoholic steatohepatitis (NASH): Secondary | ICD-10-CM | POA: Insufficient documentation

## 2020-04-07 DIAGNOSIS — Z8042 Family history of malignant neoplasm of prostate: Secondary | ICD-10-CM | POA: Diagnosis not present

## 2020-04-07 DIAGNOSIS — Z806 Family history of leukemia: Secondary | ICD-10-CM | POA: Insufficient documentation

## 2020-04-07 DIAGNOSIS — K766 Portal hypertension: Secondary | ICD-10-CM | POA: Insufficient documentation

## 2020-04-07 DIAGNOSIS — E611 Iron deficiency: Secondary | ICD-10-CM | POA: Insufficient documentation

## 2020-04-07 DIAGNOSIS — Z923 Personal history of irradiation: Secondary | ICD-10-CM | POA: Insufficient documentation

## 2020-04-07 DIAGNOSIS — K7460 Unspecified cirrhosis of liver: Secondary | ICD-10-CM | POA: Diagnosis not present

## 2020-04-07 DIAGNOSIS — Z794 Long term (current) use of insulin: Secondary | ICD-10-CM | POA: Insufficient documentation

## 2020-04-07 DIAGNOSIS — D61818 Other pancytopenia: Secondary | ICD-10-CM | POA: Insufficient documentation

## 2020-04-07 DIAGNOSIS — E119 Type 2 diabetes mellitus without complications: Secondary | ICD-10-CM | POA: Diagnosis not present

## 2020-04-07 LAB — CBC WITH DIFFERENTIAL/PLATELET
Abs Immature Granulocytes: 0 10*3/uL (ref 0.00–0.07)
Basophils Absolute: 0 10*3/uL (ref 0.0–0.1)
Basophils Relative: 0 %
Eosinophils Absolute: 0.1 10*3/uL (ref 0.0–0.5)
Eosinophils Relative: 2 %
HCT: 36.8 % (ref 36.0–46.0)
Hemoglobin: 12.3 g/dL (ref 12.0–15.0)
Immature Granulocytes: 0 %
Lymphocytes Relative: 39 %
Lymphs Abs: 0.8 10*3/uL (ref 0.7–4.0)
MCH: 29.8 pg (ref 26.0–34.0)
MCHC: 33.4 g/dL (ref 30.0–36.0)
MCV: 89.1 fL (ref 80.0–100.0)
Monocytes Absolute: 0.2 10*3/uL (ref 0.1–1.0)
Monocytes Relative: 9 %
Neutro Abs: 1 10*3/uL — ABNORMAL LOW (ref 1.7–7.7)
Neutrophils Relative %: 50 %
Platelets: 75 10*3/uL — ABNORMAL LOW (ref 150–400)
RBC: 4.13 MIL/uL (ref 3.87–5.11)
RDW: 14.3 % (ref 11.5–15.5)
WBC: 2.1 10*3/uL — ABNORMAL LOW (ref 4.0–10.5)
nRBC: 0 % (ref 0.0–0.2)

## 2020-04-07 LAB — COMPREHENSIVE METABOLIC PANEL
ALT: 21 U/L (ref 0–44)
AST: 45 U/L — ABNORMAL HIGH (ref 15–41)
Albumin: 4 g/dL (ref 3.5–5.0)
Alkaline Phosphatase: 46 U/L (ref 38–126)
Anion gap: 11 (ref 5–15)
BUN: 18 mg/dL (ref 8–23)
CO2: 28 mmol/L (ref 22–32)
Calcium: 9.1 mg/dL (ref 8.9–10.3)
Chloride: 95 mmol/L — ABNORMAL LOW (ref 98–111)
Creatinine, Ser: 0.9 mg/dL (ref 0.44–1.00)
GFR, Estimated: >60 mL/min (ref >60)
Glucose, Bld: 388 mg/dL — ABNORMAL HIGH (ref 70–99)
Potassium: 3.9 mmol/L (ref 3.5–5.1)
Sodium: 134 mmol/L — ABNORMAL LOW (ref 135–145)
Total Bilirubin: 1 mg/dL (ref 0.3–1.2)
Total Protein: 8.5 g/dL — ABNORMAL HIGH (ref 6.5–8.1)

## 2020-04-07 LAB — IRON AND TIBC
Iron: 169 ug/dL (ref 28–170)
Saturation Ratios: 31 % (ref 10.4–31.8)
TIBC: 542 ug/dL — ABNORMAL HIGH (ref 250–450)
UIBC: 373 ug/dL

## 2020-04-07 LAB — FERRITIN: Ferritin: 43 ng/mL (ref 11–307)

## 2020-04-07 LAB — LACTATE DEHYDROGENASE: LDH: 168 U/L (ref 98–192)

## 2020-04-07 MED ORDER — SODIUM CHLORIDE 0.9 % IV SOLN
Freq: Once | INTRAVENOUS | Status: AC
Start: 2020-04-07 — End: 2020-04-07
  Filled 2020-04-07: qty 250

## 2020-04-07 MED ORDER — IRON SUCROSE 20 MG/ML IV SOLN
200.0000 mg | Freq: Once | INTRAVENOUS | Status: AC
  Administered 2020-04-07: 200 mg via INTRAVENOUS
  Filled 2020-04-07: qty 10

## 2020-04-07 NOTE — Progress Notes (Signed)
[facility] OFFICE PROGRESS NOTE  Patient Care Team: Albina Billet, MD as PCP - General (Internal Medicine) End, Harrell Gave, MD as PCP - Cardiology (Cardiology)  Cancer Staging No matching staging information was found for the patient.   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 OCT 2018- BMBx- MILD dyspoiesis; variable cellularity [10-50%]; FISH/Cytogenetics-NORMAL; F-One-NGS-declined by insurance.    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 Brandon/uro-gyne,[facility]]  # HRT [clinical trial thru [place]; stopped July 2017 ]; CPAP   DIAGNOSIS: Left breast DCIS  STAGE:    0     ;GOALS: cure  CURRENT/MOST RECENT THERAPY: surveillaince    Ductal carcinoma in situ (DCIS) of left breast    INTERVAL HISTORY:  Annette Foster 80 y.o.  female pleasant patient above history of DCIS; cirrhosis portal hypertension; A. fib and pancytopenia is here for follow-up.  Patient was recently admitted to hospital for lower GI bleed.  Patient was taken off anticoagulation/Eliquis.  Patient is not currently on anticoagulation.  Patient had a colonoscopy that showed friable colonic mucosa.  Patient currently denies any blood in stools or black or stools.  Complains of ongoing fatigue.   Review of Systems  Constitutional: Positive for malaise/fatigue. Negative for chills, diaphoresis, fever and weight loss.  HENT: Negative for nosebleeds and sore throat.   Eyes: Negative for double vision.  Respiratory: Negative for cough, hemoptysis, sputum production, shortness of breath and wheezing.   Cardiovascular: Negative for chest pain,  palpitations, orthopnea and leg swelling.  Gastrointestinal: Negative for abdominal pain, blood in stool, constipation, diarrhea, heartburn, melena, nausea and vomiting.  Musculoskeletal: Positive for back pain and joint pain.  Skin: Negative.  Negative for itching and rash.  Neurological: Negative for dizziness, tingling, focal weakness, weakness and headaches.  Endo/Heme/Allergies: Does not bruise/bleed easily.  Psychiatric/Behavioral: Negative for depression. The patient is not nervous/anxious and does not have insomnia.       PAST MEDICAL HISTORY :  Past Medical History:  Diagnosis Date  . Abdominal pain   . Allergy   . Cancer [facility]) 2017   breast cancer- Left  . Cataract   . CHF (congestive heart failure) ([place])   . Collagenous colitis   . Diabetes mellitus without complication ([place])   . Diarrhea   . Diverticulosis   . Fatty liver disease, nonalcoholic   . Fibrocystic breast   . GERD (gastroesophageal reflux disease)   . Heart murmur   . Hyperlipidemia   . Hypertension   . Hypothyroidism   . IBS (irritable bowel syndrome)   . IDA (iron deficiency anemia)   . Personal history of radiation therapy 2017   LEFT BREAST CA  . PONV (postoperative nausea and vomiting)   . Sleep apnea    C-Pap    PAST SURGICAL HISTORY :   Past Surgical History:  Procedure Laterality Date  . ABDOMINAL HYSTERECTOMY     [place] b[place]  . ABDOMINAL SURGERY    . APPENDECTOMY    . BREAST BIOPSY Bilateral 2016   negative  . 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 BREAST BIOPSY Left 05/27/2016   BENIGN MAMMARY EPITHELIUM  . BREAST BIOPSY Left 11/20/2017  affirm bx x clip BENIGN MAMMARY EPITHELIUM CONSISTENT WITH RAD THERAPY  . BREAST LUMPECTOMY Left 10/17/2015   DCIS and papillary carcinoma insitu, clear margins  . CARDIAC SURGERY     has replacement valve  . CATARACT EXTRACTION Right   . CHOLECYSTECTOMY    . COLONOSCOPY WITH PROPOFOL N/A 03/11/2016   Procedure:  COLONOSCOPY WITH PROPOFOL;  Surgeon: Manya Silvas, MD;  Location: [facility] ENDOSCOPY;  Service: Endoscopy;  Laterality: N/A;  . COLONOSCOPY WITH PROPOFOL N/A 02/18/2020   Procedure: COLONOSCOPY WITH PROPOFOL;  Surgeon: [name], MD;  Location[facility] ENDOSCOPY;  Service: Endoscopy;  Laterality: N/A;  . ESOPHAGOGASTRODUODENOSCOPY (EGD) WITH PROPOFOL N/A 03/11/2016   Procedure: ESOPHAGOGASTRODUODENOSCOPY (EGD) WITH PROPOFOL;  Surgeon: Manya Silvas, MD;  Location: [facility] ENDOSCOPY;  Service: Endoscopy;  Laterality: N/A;  . ESOPHAGOGASTRODUODENOSCOPY (EGD) WITH PROPOFOL N/A 02/18/2020   Procedure: ESOPHAGOGASTRODUODENOSCOPY (EGD) WITH PROPOFOL;  Surgeon: [name], MD;  Location: [facility] ENDOSCOPY;  Service: Endoscopy;  Laterality: N/A;  . FINGER ARTHROSCOPY WITH CARPOMETACARPEL ([place]) ARTHROPLASTY Right 09/03/2018   Procedure: CARPOMETACARPEL [facility]) ARTHROPLASTY RIGHT THUMB;  Surgeon: Hessie Knows, MD;  Location: [facility] ORS;  Service: Orthopedics;  Laterality: Right;  . GANGLION CYST EXCISION Right 09/03/2018   Procedure: REMOVAL GANGLION OF WRIST;  Surgeon: Hessie Knows, MD;  Location: [facility] ORS;  Service: Orthopedics;  Laterality: Right;  . HARDWARE REMOVAL Right 09/03/2018   Procedure: HARDWARE REMOVAL RIGHT THUMB;  Surgeon: Hessie Knows, MD;  Location: [facility] ORS;  Service: Orthopedics;  Laterality: Right;  staple removed  . JOINT REPLACEMENT Left    TKR  . left sinusplasty     . MASTECTOMY, PARTIAL Left 10/17/2015   Procedure: MASTECTOMY PARTIAL REVISION;  Surgeon: Leonie Green, MD;  Location: [facility] ORS;  Service: General;  Laterality: Left;  . PARTIAL MASTECTOMY WITH NEEDLE LOCALIZATION Left 09/29/2015   Procedure: PARTIAL MASTECTOMY WITH NEEDLE LOCALIZATION;  Surgeon: Leonie Green, MD;  Location: [facility] ORS;  Service: General;  Laterality: Left;  . TOTAL ABDOMINAL HYSTERECTOMY W/ BILATERAL SALPINGOOPHORECTOMY      FAMILY HISTORY :   Family History  Problem Relation Age of Onset  .  Breast cancer Paternal Grandmother   . Colon cancer Father   . Diabetes Sister   . Diabetes Brother   . Heart disease Brother   . Prostate cancer Brother   . Colon cancer Maternal Uncle   . Prostate cancer Brother   . Bladder Cancer Brother   . Leukemia Mother        all  . Ovarian cancer Neg Hx   . Kidney cancer Neg Hx     SOCIAL HISTORY:   Social History   Tobacco Use  . Smoking status: Never Smoker  . Smokeless tobacco: Never Used  Vaping Use  . Vaping Use: Never used  Substance Use Topics  . Alcohol use: No    Alcohol/week: 0.0 standard drinks  . Drug use: No    ALLERGIES:  is allergic to codeine, demeclocycline, demerol [meperidine], hydrocodone, oxycodone, pentazocine, tetracyclines & related, and fentanyl.  MEDICATIONS:  Current Outpatient Medications  Medication Sig Dispense Refill  . acyclovir (ZOVIRAX) 400 MG tablet Take 1 tablet (400 mg total) by mouth 3 (three) times daily. For 10 days 30 tablet 0  . azelastine (OPTIVAR) 0.05 % ophthalmic solution 1 drop into affected eye    . benzonatate (TESSALON) 200 MG capsule Take 200 mg by mouth 3 (three) times daily as needed for cough.    . Cholecalciferol (VITAMIN D) 50 MCG (2000  UT) CAPS Take 2,000 Units by mouth daily.    . clobetasol ointment (TEMOVATE) 4.62 % Apply 1 application topically 2 (two) times a week. 45 g 1  . cyanocobalamin (,VITAMIN B-12,) 1000 MCG/ML injection 1,000 mcg every 30 (thirty) days.    Marland Kitchen desloratadine (CLARINEX) 5 MG tablet Take 1 tablet by mouth daily.    Marland Kitchen dicyclomine (BENTYL) 10 MG capsule Take 10 mg by mouth at bedtime.     . docusate sodium (COLACE) 100 MG capsule Take 1 capsule by mouth 2 (two) times daily as needed.    Marland Kitchen EPINEPHrine 0.3 mg/0.3 mL IJ SOAJ injection Inject 0.3 mg into the muscle as needed.    Marland Kitchen escitalopram (LEXAPRO) 5 MG tablet Take 5 mg by mouth at bedtime.    Marland Kitchen esomeprazole (NEXIUM) 40 MG capsule Take 40 mg by mouth 2 (two) times daily before a meal.     .  estradiol (ESTRACE) 0.1 MG/GM vaginal cream Place 1 Applicatorful vaginally as needed.    . fenofibrate 160 MG tablet Take 160 mg by mouth at bedtime.    . fexofenadine (ALLEGRA) 180 MG tablet 1 tablet    . fluticasone (FLONASE) 50 MCG/ACT nasal spray Place 1 spray into the nose daily as needed for allergies.    Marland Kitchen gabapentin (NEURONTIN) 300 MG capsule Take 300 mg by mouth at bedtime.     Marland Kitchen glimepiride (AMARYL) 2 MG tablet Take 2 mg by mouth at bedtime.    . hydrOXYzine (ATARAX/VISTARIL) 25 MG tablet 1 tablet as needed    . Hyoscyamine Sulfate 0.375 MG TBCR Take 0.375 mg by mouth 2 (two) times daily.     . Insulin Degludec (TRESIBA) 100 UNIT/ML SOLN Inject 54 mg into the skin at bedtime.    Marland Kitchen levothyroxine (SYNTHROID, LEVOTHROID) 75 MCG tablet Take 75 mcg by mouth daily before breakfast.     . magnesium oxide (MAG-OX) 400 MG tablet Take 400 mg by mouth 2 (two) times daily.     Marland Kitchen NOVOLOG FLEXPEN 100 UNIT/ML FlexPen Inject 15 Units into the skin 3 (three) times daily with meals. Based on sliding scale    . ondansetron (ZOFRAN) 4 MG tablet Take 4 mg by mouth every 8 (eight) hours as needed for nausea or vomiting.  1  . potassium chloride SA (K-DUR,KLOR-CON) 20 MEQ tablet Take 20 mEq by mouth 2 (two) times daily.     Marland Kitchen saccharomyces boulardii (FLORASTOR) 250 MG capsule Take 250 mg by mouth 2 (two) times daily.     . Semaglutide,0.25 or 0.5MG/DOS, 2 MG/1.5ML SOPN Inject 5 mg into the skin once a week. On Sunday.    . simvastatin (ZOCOR) 20 MG tablet Take 20 mg by mouth at bedtime.    Marland Kitchen spironolactone (ALDACTONE) 25 MG tablet Take 1 tablet (25 mg total) by mouth daily. 30 tablet 3  . torsemide (DEMADEX) 20 MG tablet Take 1 tablet (20 mg total) by mouth daily. 90 tablet 0  . traMADol (ULTRAM) 50 MG tablet 1 tablet as needed    . valACYclovir (VALTREX) 500 MG tablet Take 500 mg by mouth See admin instructions. For fever blisters take 58m twice a day as needed  1   No current facility-administered  medications for this visit.    PHYSICAL EXAMINATION: ECOG PERFORMANCE STATUS: 1 - Symptomatic but completely ambulatory  BP (!) 116/40   Pulse 68   Temp 98.9 F (37.2 C) (Tympanic)   Resp 18   Wt 160 lb 2 oz (72.6 kg)  LMP  (LMP Unknown)   SpO2 97%   BMI 28.36 kg/m   Filed Weights   04/07/20 1403  Weight: 160 lb 2 oz (72.6 kg)    Physical Exam Constitutional:      Comments: Alone.  Ambulating independently.  HENT:     Head: Normocephalic and atraumatic.     Mouth/Throat:     Pharynx: No oropharyngeal exudate.  Eyes:     Pupils: Pupils are equal, round, and reactive to light.  Cardiovascular:     Rate and Rhythm: Normal rate and regular rhythm.  Pulmonary:     Effort: Pulmonary effort is normal. No respiratory distress.     Breath sounds: Normal breath sounds. No wheezing.  Abdominal:     General: Bowel sounds are normal. There is no distension.     Palpations: Abdomen is soft. There is no mass.     Tenderness: There is no abdominal tenderness. There is no guarding or rebound.     Comments: Positive for splenomegaly.  Musculoskeletal:        General: No tenderness. Normal range of motion.     Cervical back: Normal range of motion and neck supple.  Skin:    General: Skin is warm.  Neurological:     Mental Status: She is alert and oriented to person, place, and time.  Psychiatric:        Mood and Affect: Affect normal.      LABORATORY DATA:  I have reviewed the data as listed    Component Value Date/Time   NA 134 (L) 04/07/2020 1454   NA 140 02/25/2020 1105   K 3.9 04/07/2020 1454   CL 95 (L) 04/07/2020 1454   CO2 28 04/07/2020 1454   GLUCOSE 388 (H) 04/07/2020 1454   BUN 18 04/07/2020 1454   BUN 10 02/25/2020 1105   CREATININE 0.90 04/07/2020 1454   CALCIUM 9.1 04/07/2020 1454   PROT 8.5 (H) 04/07/2020 1454   ALBUMIN 4.0 04/07/2020 1454   AST 45 (H) 04/07/2020 1454   ALT 21 04/07/2020 1454   ALKPHOS 46 04/07/2020 1454   BILITOT 1.0 04/07/2020  1454   GFRNONAA >60 04/07/2020 1454   GFRAA 80 02/25/2020 1105    No results found for: SPEP, UPEP  Lab Results  Component Value Date   WBC 2.1 (L) 04/07/2020   NEUTROABS 1.0 (L) 04/07/2020   HGB 12.3 04/07/2020   HCT 36.8 04/07/2020   MCV 89.1 04/07/2020   PLT 75 (L) 04/07/2020      Chemistry      Component Value Date/Time   NA 134 (L) 04/07/2020 1454   NA 140 02/25/2020 1105   K 3.9 04/07/2020 1454   CL 95 (L) 04/07/2020 1454   CO2 28 04/07/2020 1454   BUN 18 04/07/2020 1454   BUN 10 02/25/2020 1105   CREATININE 0.90 04/07/2020 1454      Component Value Date/Time   CALCIUM 9.1 04/07/2020 1454   ALKPHOS 46 04/07/2020 1454   AST 45 (H) 04/07/2020 1454   ALT 21 04/07/2020 1454   BILITOT 1.0 04/07/2020 1454       RADIOGRAPHIC STUDIES: I have personally reviewed the radiological images as listed and agreed with the findings in the report. No results found.   ASSESSMENT & PLAN:  Other pancytopenia ([place]) # Anemia-stable.  Hemoglobin 11.7 s/p IV iron infusion-recent GI bleed/underlying cirrhosis/; IDA-proceed with Venofer.  Will check labs and studies LDH haptoglobin.    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  ANC-1.2 Platelets 70s. STABLE.    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 AFP normal.  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  STABLE; .  Mammogram December 2020-within normal limits. STABLE; ;Off AI because of intolerance.    # DISPOSITION:  # Venofer if possible today # labs- today- cbc/cmp/iron studies/ ferritin/LDH/haptoglobin # follow up as planned in April 2022-    Dr. Hall Busing   Orders Placed This Encounter  Procedures  . CBC with Differential    Standing Status:   Future    Number of Occurrences:   1    Standing Expiration Date:   04/07/2021  . Comprehensive metabolic panel    Standing  Status:   Future    Number of Occurrences:   1    Standing Expiration Date:   04/07/2021  . Ferritin    Standing Status:   Future    Number of Occurrences:   1    Standing Expiration Date:   04/07/2021  . Iron and TIBC    Standing Status:   Future    Number of Occurrences:   1    Standing Expiration Date:   04/07/2021  . Haptoglobin    Standing Status:   Future    Number of Occurrences:   1    Standing Expiration Date:   04/07/2021  . Lactate dehydrogenase    Standing Status:   Future    Number of Occurrences:   1    Standing Expiration Date:   04/07/2021   All questions were answered. The patient knows to call the clinic with any problems, questions or concerns.      Cammie Sickle, MD 04/07/2020 3:33 PM

## 2020-04-07 NOTE — Assessment & Plan Note (Addendum)
#   Anemia-stable.  Hemoglobin 11.7 s/p IV iron infusion-recent GI bleed/underlying cirrhosis/; IDA-proceed with Venofer.  Will check labs and studies LDH haptoglobin.    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  ANC-1.2 Platelets 70s. STABLE.    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 AFP normal.  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  STABLE; .  Mammogram December 2020-within normal limits. STABLE; ;Off AI because of intolerance.    # DISPOSITION:  # Venofer if possible today # labs- today- cbc/cmp/iron studies/ ferritin/LDH/haptoglobin # follow up as planned in April 2022-    Dr. Hall Busing

## 2020-04-09 LAB — HAPTOGLOBIN: Haptoglobin: 12 mg/dL — ABNORMAL LOW (ref 42–346)

## 2020-04-19 ENCOUNTER — Ambulatory Visit (INDEPENDENT_AMBULATORY_CARE_PROVIDER_SITE_OTHER): Payer: Medicare PPO | Admitting: Gastroenterology

## 2020-04-19 ENCOUNTER — Other Ambulatory Visit: Payer: Self-pay

## 2020-04-19 ENCOUNTER — Encounter: Payer: Self-pay | Admitting: Gastroenterology

## 2020-04-19 VITALS — BP 130/61 | HR 64 | Ht 63.0 in | Wt 160.6 lb

## 2020-04-19 DIAGNOSIS — K7581 Nonalcoholic steatohepatitis (NASH): Secondary | ICD-10-CM

## 2020-04-19 DIAGNOSIS — R188 Other ascites: Secondary | ICD-10-CM | POA: Diagnosis not present

## 2020-04-19 DIAGNOSIS — K7460 Unspecified cirrhosis of liver: Secondary | ICD-10-CM | POA: Diagnosis not present

## 2020-04-19 DIAGNOSIS — R1013 Epigastric pain: Secondary | ICD-10-CM | POA: Diagnosis not present

## 2020-04-19 DIAGNOSIS — I8500 Esophageal varices without bleeding: Secondary | ICD-10-CM

## 2020-04-19 MED ORDER — ONDANSETRON HCL 4 MG PO TABS: 4.0000 mg | ORAL_TABLET | Freq: Three times a day (TID) | ORAL | 3 refills | Status: DC | PRN

## 2020-04-19 NOTE — Progress Notes (Signed)
Primary Care Physician: Albina Billet, MD  Primary Gastroenterologist:  Dr. [name]  No chief complaint on file.   HPI: Annette Foster is a 80 y.o. female here for follow-up after recently being seen by cardiology.  The patient was seen in the office and had an upper endoscopy prior to that after a GI bleed.  The patient was found to have esophageal varices and due to her cardiac issues and hypotension she was followed up by her cardiologist to see if nadolol could be started on the patient.  The patient had her medications rearranged by the cardiologist and he recommended she come back to see me and also follow-up with him in 6 weeks.  The patient was also noted to have anasarca with a normal albumin.  Despite the normal albumin cardiology felt that the anasarca was from her cirrhosis. The patient reports that her swelling has completely disappeared and she is not having any edema anymore.  The patient does report that since starting clindamycin for a tooth abscess she has been having epigastric pain.  She is also reporting some pain over her sternum that is tender to the touch on her ribs.  There is no report of any black stools or bloody stools.  She also says she has been having some nausea and vomiting associated with the pain that started this weekend at the same time she started antibiotics.  Past Medical History:  Diagnosis Date  . Abdominal pain   . Allergy   . Cancer [facility]) 2017   breast cancer- Left  . Cataract   . CHF (congestive heart failure) ([place])   . Collagenous colitis   . Diabetes mellitus without complication ([place])   . Diarrhea   . Diverticulosis   . Fatty liver disease, nonalcoholic   . Fibrocystic breast   . GERD (gastroesophageal reflux disease)   . Heart murmur   . Hyperlipidemia   . Hypertension   . Hypothyroidism   . IBS (irritable bowel syndrome)   . IDA (iron deficiency anemia)   . Personal history of radiation therapy 2017   LEFT BREAST CA  .  PONV (postoperative nausea and vomiting)   . Sleep apnea    C-Pap    Current Outpatient Medications  Medication Sig Dispense Refill  . acyclovir (ZOVIRAX) 400 MG tablet Take 1 tablet (400 mg total) by mouth 3 (three) times daily. For 10 days 30 tablet 0  . azelastine (OPTIVAR) 0.05 % ophthalmic solution 1 drop into affected eye    . benzonatate (TESSALON) 200 MG capsule Take 200 mg by mouth 3 (three) times daily as needed for cough.    . Cholecalciferol (VITAMIN D) 50 MCG (2000 UT) CAPS Take 2,000 Units by mouth daily.    . clobetasol ointment (TEMOVATE) 4.13 % Apply 1 application topically 2 (two) times a week. 45 g 1  . cyanocobalamin (,VITAMIN B-12,) 1000 MCG/ML injection 1,000 mcg every 30 (thirty) days.    Marland Kitchen desloratadine (CLARINEX) 5 MG tablet Take 1 tablet by mouth daily.    Marland Kitchen dicyclomine (BENTYL) 10 MG capsule Take 10 mg by mouth at bedtime.     . docusate sodium (COLACE) 100 MG capsule Take 1 capsule by mouth 2 (two) times daily as needed.    Marland Kitchen EPINEPHrine 0.3 mg/0.3 mL IJ SOAJ injection Inject 0.3 mg into the muscle as needed.    Marland Kitchen escitalopram (LEXAPRO) 5 MG tablet Take 5 mg by mouth at bedtime.    Marland Kitchen esomeprazole (  NEXIUM) 40 MG capsule Take 40 mg by mouth 2 (two) times daily before a meal.     . estradiol (ESTRACE) 0.1 MG/GM vaginal cream Place 1 Applicatorful vaginally as needed.    . fenofibrate 160 MG tablet Take 160 mg by mouth at bedtime.    . fexofenadine (ALLEGRA) 180 MG tablet 1 tablet    . fluticasone (FLONASE) 50 MCG/ACT nasal spray Place 1 spray into the nose daily as needed for allergies.    Marland Kitchen gabapentin (NEURONTIN) 300 MG capsule Take 300 mg by mouth at bedtime.     Marland Kitchen glimepiride (AMARYL) 2 MG tablet Take 2 mg by mouth at bedtime.    . hydrOXYzine (ATARAX/VISTARIL) 25 MG tablet 1 tablet as needed    . Hyoscyamine Sulfate 0.375 MG TBCR Take 0.375 mg by mouth 2 (two) times daily.     . Insulin Degludec (TRESIBA) 100 UNIT/ML SOLN Inject 54 mg into the skin at bedtime.     Marland Kitchen levothyroxine (SYNTHROID, LEVOTHROID) 75 MCG tablet Take 75 mcg by mouth daily before breakfast.     . magnesium oxide (MAG-OX) 400 MG tablet Take 400 mg by mouth 2 (two) times daily.     Marland Kitchen NOVOLOG FLEXPEN 100 UNIT/ML FlexPen Inject 15 Units into the skin 3 (three) times daily with meals. Based on sliding scale    . ondansetron (ZOFRAN) 4 MG tablet Take 4 mg by mouth every 8 (eight) hours as needed for nausea or vomiting.  1  . potassium chloride SA (K-DUR,KLOR-CON) 20 MEQ tablet Take 20 mEq by mouth 2 (two) times daily.     Marland Kitchen saccharomyces boulardii (FLORASTOR) 250 MG capsule Take 250 mg by mouth 2 (two) times daily.     . Semaglutide,0.25 or 0.5MG/DOS, 2 MG/1.5ML SOPN Inject 5 mg into the skin once a week. On Sunday.    . simvastatin (ZOCOR) 20 MG tablet Take 20 mg by mouth at bedtime.    Marland Kitchen spironolactone (ALDACTONE) 25 MG tablet Take 1 tablet (25 mg total) by mouth daily. 30 tablet 3  . torsemide (DEMADEX) 20 MG tablet Take 1 tablet (20 mg total) by mouth daily. 90 tablet 0  . traMADol (ULTRAM) 50 MG tablet 1 tablet as needed    . valACYclovir (VALTREX) 500 MG tablet Take 500 mg by mouth See admin instructions. For fever blisters take 530m twice a day as needed  1   No current facility-administered medications for this visit.    Allergies as of 04/19/2020 - Review Complete 04/07/2020  Allergen Reaction Noted  . Codeine Itching and Nausea And Vomiting 06/28/2014  . Demeclocycline Rash 04/10/2012  . Demerol [meperidine] Itching and Nausea And Vomiting 06/28/2014  . Hydrocodone Itching and Nausea And Vomiting 08/24/2018  . Oxycodone Itching and Nausea And Vomiting 09/26/2015  . Pentazocine Itching and Nausea And Vomiting 04/10/2012  . Tetracyclines & related Rash 06/28/2014  . Fentanyl Itching and Nausea And Vomiting 06/28/2014    ROS:  General: Negative for anorexia, weight loss, fever, chills, fatigue, weakness. ENT: Negative for hoarseness, difficulty swallowing , nasal  congestion. CV: Negative for chest pain, angina, palpitations, dyspnea on exertion, peripheral edema.  Respiratory: Negative for dyspnea at rest, dyspnea on exertion, cough, sputum, wheezing.  GI: See history of present illness. GU:  Negative for dysuria, hematuria, urinary incontinence, urinary frequency, nocturnal urination.  Endo: Negative for unusual weight change.    Physical Examination:   LMP  (LMP Unknown)   General: Well-nourished, well-developed in no acute distress.  Eyes: No  icterus. Conjunctivae pink. Lungs: Clear to auscultation bilaterally. Non-labored. Heart: Regular rate and rhythm, no murmurs rubs or gallops.  Abdomen: Bowel sounds are normal, tender on the sternum to palpation and in the epigastric area, nondistended, no hepatosplenomegaly or masses, no abdominal bruits or hernia , no rebound or guarding.   Extremities: No lower extremity edema. No clubbing or deformities. Neuro: Alert and oriented x 3.  Grossly intact. Skin: Warm and dry, no jaundice.   Psych: Alert and cooperative, normal mood and affect.  Labs:    Imaging Studies: No results found.  Assessment and Plan:   Annette Foster is a 80 y.o. y/o female who has a history of cirrhosis and esophageal varices.  The patient's anasarca has resolved.  The patient has epigastric pain and has had nausea and vomiting.  The patient will be started on some Zofran and will be set up for an EGD for banding of the esophageal varices.  She will therefore not need to have her beta blockers continued for her cirrhosis if the banding is accomplished thereby decreasing her symptoms of fatigue that she reports with this medication.  The patient has been explained the plan agrees with it.     [name], MD. Marval Regal    Note: This dictation was prepared with Dragon dictation along with smaller phrase technology. Any transcriptional errors that result from this process are unintentional.

## 2020-04-19 NOTE — H&P (View-Only) (Signed)
Primary Care Physician: Albina Billet, MD  Primary Gastroenterologist:  Dr. [name]  No chief complaint on file.   HPI: Annette Foster is a 80 y.o. female here for follow-up after recently being seen by cardiology.  The patient was seen in the office and had an upper endoscopy prior to that after a GI bleed.  The patient was found to have esophageal varices and due to her cardiac issues and hypotension she was followed up by her cardiologist to see if nadolol could be started on the patient.  The patient had her medications rearranged by the cardiologist and he recommended she come back to see me and also follow-up with him in 6 weeks.  The patient was also noted to have anasarca with a normal albumin.  Despite the normal albumin cardiology felt that the anasarca was from her cirrhosis. The patient reports that her swelling has completely disappeared and she is not having any edema anymore.  The patient does report that since starting clindamycin for a tooth abscess she has been having epigastric pain.  She is also reporting some pain over her sternum that is tender to the touch on her ribs.  There is no report of any black stools or bloody stools.  She also says she has been having some nausea and vomiting associated with the pain that started this weekend at the same time she started antibiotics.  Past Medical History:  Diagnosis Date  . Abdominal pain   . Allergy   . Cancer [facility]) 2017   breast cancer- Left  . Cataract   . CHF (congestive heart failure) ([place])   . Collagenous colitis   . Diabetes mellitus without complication ([place])   . Diarrhea   . Diverticulosis   . Fatty liver disease, nonalcoholic   . Fibrocystic breast   . GERD (gastroesophageal reflux disease)   . Heart murmur   . Hyperlipidemia   . Hypertension   . Hypothyroidism   . IBS (irritable bowel syndrome)   . IDA (iron deficiency anemia)   . Personal history of radiation therapy 2017   LEFT BREAST CA  .  PONV (postoperative nausea and vomiting)   . Sleep apnea    C-Pap    Current Outpatient Medications  Medication Sig Dispense Refill  . acyclovir (ZOVIRAX) 400 MG tablet Take 1 tablet (400 mg total) by mouth 3 (three) times daily. For 10 days 30 tablet 0  . azelastine (OPTIVAR) 0.05 % ophthalmic solution 1 drop into affected eye    . benzonatate (TESSALON) 200 MG capsule Take 200 mg by mouth 3 (three) times daily as needed for cough.    . Cholecalciferol (VITAMIN D) 50 MCG (2000 UT) CAPS Take 2,000 Units by mouth daily.    . clobetasol ointment (TEMOVATE) 4.78 % Apply 1 application topically 2 (two) times a week. 45 g 1  . cyanocobalamin (,VITAMIN B-12,) 1000 MCG/ML injection 1,000 mcg every 30 (thirty) days.    Marland Kitchen desloratadine (CLARINEX) 5 MG tablet Take 1 tablet by mouth daily.    Marland Kitchen dicyclomine (BENTYL) 10 MG capsule Take 10 mg by mouth at bedtime.     . docusate sodium (COLACE) 100 MG capsule Take 1 capsule by mouth 2 (two) times daily as needed.    Marland Kitchen EPINEPHrine 0.3 mg/0.3 mL IJ SOAJ injection Inject 0.3 mg into the muscle as needed.    Marland Kitchen escitalopram (LEXAPRO) 5 MG tablet Take 5 mg by mouth at bedtime.    Marland Kitchen esomeprazole (  NEXIUM) 40 MG capsule Take 40 mg by mouth 2 (two) times daily before a meal.     . estradiol (ESTRACE) 0.1 MG/GM vaginal cream Place 1 Applicatorful vaginally as needed.    . fenofibrate 160 MG tablet Take 160 mg by mouth at bedtime.    . fexofenadine (ALLEGRA) 180 MG tablet 1 tablet    . fluticasone (FLONASE) 50 MCG/ACT nasal spray Place 1 spray into the nose daily as needed for allergies.    Marland Kitchen gabapentin (NEURONTIN) 300 MG capsule Take 300 mg by mouth at bedtime.     Marland Kitchen glimepiride (AMARYL) 2 MG tablet Take 2 mg by mouth at bedtime.    . hydrOXYzine (ATARAX/VISTARIL) 25 MG tablet 1 tablet as needed    . Hyoscyamine Sulfate 0.375 MG TBCR Take 0.375 mg by mouth 2 (two) times daily.     . Insulin Degludec (TRESIBA) 100 UNIT/ML SOLN Inject 54 mg into the skin at bedtime.     Marland Kitchen levothyroxine (SYNTHROID, LEVOTHROID) 75 MCG tablet Take 75 mcg by mouth daily before breakfast.     . magnesium oxide (MAG-OX) 400 MG tablet Take 400 mg by mouth 2 (two) times daily.     Marland Kitchen NOVOLOG FLEXPEN 100 UNIT/ML FlexPen Inject 15 Units into the skin 3 (three) times daily with meals. Based on sliding scale    . ondansetron (ZOFRAN) 4 MG tablet Take 4 mg by mouth every 8 (eight) hours as needed for nausea or vomiting.  1  . potassium chloride SA (K-DUR,KLOR-CON) 20 MEQ tablet Take 20 mEq by mouth 2 (two) times daily.     Marland Kitchen saccharomyces boulardii (FLORASTOR) 250 MG capsule Take 250 mg by mouth 2 (two) times daily.     . Semaglutide,0.25 or 0.5MG/DOS, 2 MG/1.5ML SOPN Inject 5 mg into the skin once a week. On Sunday.    . simvastatin (ZOCOR) 20 MG tablet Take 20 mg by mouth at bedtime.    Marland Kitchen spironolactone (ALDACTONE) 25 MG tablet Take 1 tablet (25 mg total) by mouth daily. 30 tablet 3  . torsemide (DEMADEX) 20 MG tablet Take 1 tablet (20 mg total) by mouth daily. 90 tablet 0  . traMADol (ULTRAM) 50 MG tablet 1 tablet as needed    . valACYclovir (VALTREX) 500 MG tablet Take 500 mg by mouth See admin instructions. For fever blisters take 521m twice a day as needed  1   No current facility-administered medications for this visit.    Allergies as of 04/19/2020 - Review Complete 04/07/2020  Allergen Reaction Noted  . Codeine Itching and Nausea And Vomiting 06/28/2014  . Demeclocycline Rash 04/10/2012  . Demerol [meperidine] Itching and Nausea And Vomiting 06/28/2014  . Hydrocodone Itching and Nausea And Vomiting 08/24/2018  . Oxycodone Itching and Nausea And Vomiting 09/26/2015  . Pentazocine Itching and Nausea And Vomiting 04/10/2012  . Tetracyclines & related Rash 06/28/2014  . Fentanyl Itching and Nausea And Vomiting 06/28/2014    ROS:  General: Negative for anorexia, weight loss, fever, chills, fatigue, weakness. ENT: Negative for hoarseness, difficulty swallowing , nasal  congestion. CV: Negative for chest pain, angina, palpitations, dyspnea on exertion, peripheral edema.  Respiratory: Negative for dyspnea at rest, dyspnea on exertion, cough, sputum, wheezing.  GI: See history of present illness. GU:  Negative for dysuria, hematuria, urinary incontinence, urinary frequency, nocturnal urination.  Endo: Negative for unusual weight change.    Physical Examination:   LMP  (LMP Unknown)   General: Well-nourished, well-developed in no acute distress.  Eyes: No  icterus. Conjunctivae pink. Lungs: Clear to auscultation bilaterally. Non-labored. Heart: Regular rate and rhythm, no murmurs rubs or gallops.  Abdomen: Bowel sounds are normal, tender on the sternum to palpation and in the epigastric area, nondistended, no hepatosplenomegaly or masses, no abdominal bruits or hernia , no rebound or guarding.   Extremities: No lower extremity edema. No clubbing or deformities. Neuro: Alert and oriented x 3.  Grossly intact. Skin: Warm and dry, no jaundice.   Psych: Alert and cooperative, normal mood and affect.  Labs:    Imaging Studies: No results found.  Assessment and Plan:   Annette Foster is a 80 y.o. y/o female who has a history of cirrhosis and esophageal varices.  The patient's anasarca has resolved.  The patient has epigastric pain and has had nausea and vomiting.  The patient will be started on some Zofran and will be set up for an EGD for banding of the esophageal varices.  She will therefore not need to have her beta blockers continued for her cirrhosis if the banding is accomplished thereby decreasing her symptoms of fatigue that she reports with this medication.  The patient has been explained the plan agrees with it.     [name], MD. Marval Regal    Note: This dictation was prepared with Dragon dictation along with smaller phrase technology. Any transcriptional errors that result from this process are unintentional.

## 2020-04-21 ENCOUNTER — Other Ambulatory Visit: Payer: Self-pay

## 2020-04-21 ENCOUNTER — Other Ambulatory Visit
Admission: RE | Admit: 2020-04-21 | Discharge: 2020-04-21 | Disposition: A | Discharge status: Home / Self Care | Payer: Medicare PPO | Source: Ambulatory Visit | Attending: Gastroenterology | Admitting: Gastroenterology

## 2020-04-21 DIAGNOSIS — Z20822 Contact with and (suspected) exposure to covid-19: Secondary | ICD-10-CM | POA: Diagnosis not present

## 2020-04-21 DIAGNOSIS — I8500 Esophageal varices without bleeding: Secondary | ICD-10-CM

## 2020-04-21 DIAGNOSIS — Z01812 Encounter for preprocedural laboratory examination: Secondary | ICD-10-CM | POA: Insufficient documentation

## 2020-04-21 LAB — SARS CORONAVIRUS 2 (TAT 6-24 HRS): SARS Coronavirus 2: NEGATIVE

## 2020-04-25 ENCOUNTER — Ambulatory Visit: Payer: Medicare PPO | Admitting: Anesthesiology

## 2020-04-25 ENCOUNTER — Encounter
Admission: RE | Disposition: A | Discharge status: Home / Self Care | Payer: Self-pay | Source: Home / Self Care | Attending: Gastroenterology

## 2020-04-25 ENCOUNTER — Ambulatory Visit
Admission: RE | Admit: 2020-04-25 | Discharge: 2020-04-25 | Disposition: A | Discharge status: Home / Self Care | Payer: Medicare PPO | Attending: Gastroenterology | Admitting: Gastroenterology

## 2020-04-25 ENCOUNTER — Encounter: Payer: Self-pay | Admitting: Gastroenterology

## 2020-04-25 DIAGNOSIS — I851 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 bleeding: Secondary | ICD-10-CM | POA: Diagnosis not present

## 2020-04-25 DIAGNOSIS — Z79899 Other long term (current) drug therapy: Secondary | ICD-10-CM | POA: Insufficient documentation

## 2020-04-25 DIAGNOSIS — K766 Portal hypertension: Secondary | ICD-10-CM | POA: Insufficient documentation

## 2020-04-25 DIAGNOSIS — R188 Other ascites: Secondary | ICD-10-CM

## 2020-04-25 DIAGNOSIS — Z13810 Encounter for screening for upper gastrointestinal disorder: Secondary | ICD-10-CM | POA: Diagnosis present

## 2020-04-25 DIAGNOSIS — I110 Hypertensive heart disease with heart failure: Secondary | ICD-10-CM | POA: Diagnosis not present

## 2020-04-25 DIAGNOSIS — I509 Heart failure, unspecified: Secondary | ICD-10-CM | POA: Insufficient documentation

## 2020-04-25 DIAGNOSIS — Z79890 Hormone replacement therapy (postmenopausal): Secondary | ICD-10-CM | POA: Diagnosis not present

## 2020-04-25 DIAGNOSIS — Z794 Long term (current) use of insulin: Secondary | ICD-10-CM | POA: Diagnosis not present

## 2020-04-25 DIAGNOSIS — I8500 Esophageal varices without bleeding: Secondary | ICD-10-CM | POA: Diagnosis not present

## 2020-04-25 DIAGNOSIS — K7460 Unspecified cirrhosis of liver: Secondary | ICD-10-CM

## 2020-04-25 HISTORY — PX: ESOPHAGOGASTRODUODENOSCOPY (EGD) WITH PROPOFOL: SHX5813

## 2020-04-25 LAB — GLUCOSE, CAPILLARY
Glucose-Capillary: 304 mg/dL — ABNORMAL HIGH (ref 70–99)
Glucose-Capillary: 336 mg/dL — ABNORMAL HIGH (ref 70–99)

## 2020-04-25 SURGERY — ESOPHAGOGASTRODUODENOSCOPY (EGD) WITH PROPOFOL
Anesthesia: General

## 2020-04-25 MED ORDER — PROPOFOL 10 MG/ML IV BOLUS
INTRAVENOUS | Status: DC | PRN
  Administered 2020-04-25: 50 mg via INTRAVENOUS
  Administered 2020-04-25 (×2): 20 mg via INTRAVENOUS

## 2020-04-25 MED ORDER — INSULIN ASPART 100 UNIT/ML ~~LOC~~ SOLN
SUBCUTANEOUS | Status: AC
  Filled 2020-04-25: qty 1

## 2020-04-25 MED ORDER — EPHEDRINE SULFATE 50 MG/ML IJ SOLN
INTRAMUSCULAR | Status: DC | PRN
  Administered 2020-04-25 (×2): 5 mg via INTRAVENOUS

## 2020-04-25 MED ORDER — INSULIN ASPART 100 UNIT/ML ~~LOC~~ SOLN
6.0000 [IU] | Freq: Once | SUBCUTANEOUS | Status: AC
  Administered 2020-04-25: 6 [IU] via SUBCUTANEOUS

## 2020-04-25 MED ORDER — ONDANSETRON HCL 4 MG/2ML IJ SOLN
INTRAMUSCULAR | Status: AC
  Filled 2020-04-25: qty 2

## 2020-04-25 MED ORDER — ONDANSETRON HCL 4 MG/2ML IJ SOLN
4.0000 mg | Freq: Once | INTRAMUSCULAR | Status: AC
  Administered 2020-04-25: 4 mg via INTRAVENOUS

## 2020-04-25 MED ORDER — LIDOCAINE HCL (CARDIAC) PF 100 MG/5ML IV SOSY
PREFILLED_SYRINGE | INTRAVENOUS | Status: DC | PRN
  Administered 2020-04-25: 100 mg via INTRAVENOUS

## 2020-04-25 MED ORDER — GLYCOPYRROLATE 0.2 MG/ML IJ SOLN
INTRAMUSCULAR | Status: DC | PRN
  Administered 2020-04-25 (×2): .2 mg via INTRAVENOUS

## 2020-04-25 MED ORDER — SODIUM CHLORIDE 0.9 % IV SOLN: INTRAVENOUS | Status: DC

## 2020-04-25 MED ORDER — PROPOFOL 500 MG/50ML IV EMUL
INTRAVENOUS | Status: DC | PRN
  Administered 2020-04-25: 150 ug/kg/min via INTRAVENOUS

## 2020-04-25 NOTE — Op Note (Signed)
[facility] Patient Name: Annette Foster Procedure Date: 04/25/2020 10:37 AM MRN: [id number] Account #: [id number] Date of Birth: 01-Dec-1940 Admit Type: Outpatient Age: 80 Room: [facility] ENDO ROOM 4 Gender: Female Note Status: Finalized Procedure:             Upper GI endoscopy Indications:           Cirrhosis with suspected esophageal varices Providers:             [name] MD, MD Referring MD:          Leona Carry. Hall Busing, MD (Referring MD) Medicines:             Propofol per Anesthesia Complications:         No immediate complications. Procedure:             Pre-Anesthesia Assessment:                        - Prior to the procedure, a History and Physical was                         performed, and patient medications and allergies were                         reviewed. The patient's tolerance of previous                         anesthesia was also reviewed. The risks and benefits                         of the procedure and the sedation options and risks                         were discussed with the patient. All questions were                         answered, and informed consent was obtained. Prior                         Anticoagulants: The patient has taken no previous                         anticoagulant or antiplatelet agents. ASA Grade                         Assessment: II - A patient with mild systemic disease.                         After reviewing the risks and benefits, the patient                         was deemed in satisfactory condition to undergo the                         procedure.                        After obtaining informed consent, the endoscope was  passed under direct vision. Throughout the procedure,                         the patient's blood pressure, pulse, and oxygen                         saturations were monitored continuously. The Endoscope                         was introduced through  the mouth, and advanced to the                         second part of duodenum. The upper GI endoscopy was                         accomplished without difficulty. The patient tolerated                         the procedure well. Findings:      Grade II varices were found in the lower third of the esophagus. Three       bands were successfully placed with incomplete eradication of varices.       There was no bleeding during the procedure.      Moderate portal hypertensive gastropathy was found in the entire       examined stomach.      The examined duodenum was normal. Impression:            - Grade II esophageal varices. Incompletely                         eradicated. Banded.                        - Portal hypertensive gastropathy.                        - Normal examined duodenum.                        - No specimens collected. Recommendation:        - Discharge patient to home.                        - Resume previous diet.                        - Continue present medications.                        - Repeat upper endoscopy in 4 weeks for retreatment. Procedure Code(s):     --- Professional ---                        [phone], Esophagogastroduodenoscopy, flexible,                         transoral; with band ligation of esophageal/gastric                         varices Diagnosis Code(s):     --- Professional ---  K74.60, Unspecified cirrhosis of liver                        K76.6, Portal hypertension CPT copyright 2019 American Medical Association. All rights reserved. The codes documented in this report are preliminary and upon coder review may  be revised to meet current compliance requirements. [name] MD, MD 04/25/2020 10:53:15 AM This report has been signed electronically. Number of Addenda: 0 Note Initiated On: 04/25/2020 10:37 AM Estimated Blood Loss:  Estimated blood loss: none.      [facility]

## 2020-04-25 NOTE — Anesthesia Preprocedure Evaluation (Signed)
Anesthesia Evaluation  Patient identified by MRN, date of birth, ID band Patient awake    Reviewed: Allergy & Precautions, H&P , NPO status , Patient's Chart, lab work & pertinent test results  History of Anesthesia Complications (+) PONV and history of anesthetic complications  Airway Mallampati: III  TM Distance: <3 FB Neck ROM: limited    Dental  (+) Missing   Pulmonary sleep apnea ,    Pulmonary exam normal        Cardiovascular Exercise Tolerance: Good hypertension, (-) angina+CHF  Normal cardiovascular exam+ Valvular Problems/Murmurs      Neuro/Psych negative neurological ROS  negative psych ROS   GI/Hepatic GERD  Medicated and Controlled,(+) Hepatitis -  Endo/Other  diabetes, Type 2, Insulin DependentHypothyroidism   Renal/GU Renal disease  negative genitourinary   Musculoskeletal   Abdominal   Peds  Hematology negative hematology ROS (+)   Anesthesia Other Findings Patient is NPO appropriate and reports no nausea or vomiting today.  Past Medical History: No date: Abdominal pain No date: Allergy 2017: Cancer ([place])     Comment:  breast cancer- Left No date: Cataract No date: CHF (congestive heart failure) ([facility]) No date: Collagenous colitis No date: Diabetes mellitus without complication ([facility]) No date: Diarrhea No date: Diverticulosis No date: Fatty liver disease, nonalcoholic No date: Fibrocystic breast No date: GERD (gastroesophageal reflux disease) No date: Heart murmur No date: Hyperlipidemia No date: Hypertension No date: Hypothyroidism No date: IBS (irritable bowel syndrome) No date: IDA (iron deficiency anemia) 2017: Personal history of radiation therapy     Comment:  LEFT BREAST CA No date: PONV (postoperative nausea and vomiting) No date: Sleep apnea     Comment:  C-Pap  Past Surgical History: No date: ABDOMINAL HYSTERECTOMY     Comment:  [place] b[place] No date: ABDOMINAL SURGERY No  date: APPENDECTOMY 2016: BREAST BIOPSY; Bilateral     Comment:  negative 09/11/2015: BREAST BIOPSY; Left     Comment:  DCIS, papillary carcinoma in situ 05/27/2016: BREAST BIOPSY; Left     Comment:  BENIGN MAMMARY EPITHELIUM 11/20/2017: BREAST BIOPSY; Left     Comment:  affirm bx x clip BENIGN MAMMARY EPITHELIUM CONSISTENT               WITH RAD THERAPY 10/17/2015: BREAST LUMPECTOMY; Left     Comment:  DCIS and papillary carcinoma insitu, clear margins No date: CARDIAC SURGERY     Comment:  has replacement valve No date: CATARACT EXTRACTION; Right No date: CHOLECYSTECTOMY 03/11/2016: COLONOSCOPY WITH PROPOFOL; N/A     Comment:  Procedure: COLONOSCOPY WITH PROPOFOL;  Surgeon: Manya Silvas, MD;  Location: [facility] ENDOSCOPY;  Service:               Endoscopy;  Laterality: N/A; 02/18/2020: COLONOSCOPY WITH PROPOFOL; N/A     Comment:  Procedure: COLONOSCOPY WITH PROPOFOL;  Surgeon: [name], MD;  Location: [facility] ENDOSCOPY;  Service:               Endoscopy;  Laterality: N/A; 03/11/2016: ESOPHAGOGASTRODUODENOSCOPY (EGD) WITH PROPOFOL; N/A     Comment:  Procedure: ESOPHAGOGASTRODUODENOSCOPY (EGD) WITH               PROPOFOL;  Surgeon: Manya Silvas, MD;  Location: [facility]              ENDOSCOPY;  Service: Endoscopy;  Laterality: N/A; 02/18/2020: ESOPHAGOGASTRODUODENOSCOPY (EGD) WITH PROPOFOL; N/A     Comment:  Procedure: ESOPHAGOGASTRODUODENOSCOPY (EGD) WITH               PROPOFOL;  Surgeon: [name], MD;  Location: [facility]               ENDOSCOPY;  Service: Endoscopy;  Laterality: N/A; 09/03/2018: FINGER ARTHROSCOPY WITH CARPOMETACARPEL ([place])  ARTHROPLASTY; Right     Comment:  Procedure: CARPOMETACARPEL [facility]) ARTHROPLASTY RIGHT               THUMB;  Surgeon: Hessie Knows, MD;  Location: [facility] ORS;               Service: Orthopedics;  Laterality: Right; 09/03/2018: GANGLION CYST EXCISION; Right     Comment:  Procedure: REMOVAL GANGLION OF WRIST;  Surgeon: Hessie Knows, MD;  Location: [facility] ORS;  Service: Orthopedics;               Laterality: Right; 09/03/2018: HARDWARE REMOVAL; Right     Comment:  Procedure: HARDWARE REMOVAL RIGHT THUMB;  Surgeon: Hessie Knows, MD;  Location: [facility] ORS;  Service: Orthopedics;               Laterality: Right;  staple removed No date: JOINT REPLACEMENT; Left     Comment:  TKR No date: left sinusplasty  10/17/2015: MASTECTOMY, PARTIAL; Left     Comment:  Procedure: MASTECTOMY PARTIAL REVISION;  Surgeon: Leonie Green, MD;  Location: [facility] ORS;  Service: General;              Laterality: Left; 09/29/2015: PARTIAL MASTECTOMY WITH NEEDLE LOCALIZATION; Left     Comment:  Procedure: PARTIAL MASTECTOMY WITH NEEDLE LOCALIZATION;               Surgeon: Leonie Green, MD;  Location: [facility] ORS;                Service: General;  Laterality: Left; No date: TOTAL ABDOMINAL HYSTERECTOMY W/ BILATERAL SALPINGOOPHORECTOMY  BMI    Body Mass Index: 27.98 kg/m      Reproductive/Obstetrics negative OB ROS                             Anesthesia Physical Anesthesia Plan  ASA: III  Anesthesia Plan: General   Post-op Pain Management:    Induction: Intravenous  PONV Risk Score and Plan: Propofol infusion and TIVA  Airway Management Planned: Natural Airway and Nasal Cannula  Additional Equipment:   Intra-op Plan:   Post-operative Plan:   Informed Consent: I have reviewed the patients History and Physical, chart, labs and discussed the procedure including the risks, benefits and alternatives for the proposed anesthesia with the patient or authorized representative who has indicated his/her understanding and acceptance.     Dental Advisory Given  Plan Discussed with: Anesthesiologist, CRNA and Surgeon  Anesthesia Plan Comments: (Patient consented for risks of anesthesia including but not limited to:  - adverse reactions to medications - risk  of airway placement if required - damage to eyes, teeth, lips or other oral mucosa - nerve damage due to positioning  - sore throat or hoarseness - Damage to heart, brain, nerves, lungs, other parts of  body or loss of life  Patient voiced understanding.)        Anesthesia Quick Evaluation

## 2020-04-25 NOTE — Anesthesia Procedure Notes (Signed)
Procedure Name: MAC Date/Time: 04/25/2020 10:39 AM Performed by: Lily Peer, Kashari Chalmers, CRNA Pre-anesthesia Checklist: Patient identified, Emergency Drugs available, Suction available, Patient being monitored and Timeout performed Patient Re-evaluated:Patient Re-evaluated prior to induction Oxygen Delivery Method: Nasal cannula Induction Type: IV induction

## 2020-04-25 NOTE — Interval H&P Note (Signed)
Annette Lame, MD [facility] [address] [place], [place] [address] Phone:[phone] Fax : [phone]  Primary Care Physician:  Albina Billet, MD Primary Gastroenterologist:  Dr. Allen Norris  Pre-Procedure History & Physical: HPI:  Annette Foster is a 80 y.o. female is here for an endoscopy.   Past Medical History:  Diagnosis Date   Abdominal pain    Allergy    Cancer ([place]) 2017   breast cancer- Left   Cataract    CHF (congestive heart failure) (HCC)    Collagenous colitis    Diabetes mellitus without complication (HCC)    Diarrhea    Diverticulosis    Fatty liver disease, nonalcoholic    Fibrocystic breast    GERD (gastroesophageal reflux disease)    Heart murmur    Hyperlipidemia    Hypertension    Hypothyroidism    IBS (irritable bowel syndrome)    IDA (iron deficiency anemia)    Personal history of radiation therapy 2017   LEFT BREAST CA   PONV (postoperative nausea and vomiting)    Sleep apnea    C-Pap    Past Surgical History:  Procedure Laterality Date   ABDOMINAL HYSTERECTOMY     tah bso   ABDOMINAL SURGERY     APPENDECTOMY     BREAST BIOPSY Bilateral 2016   negative   BREAST 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/2015   DCIS, papillary carcinoma in situ   BREAST BIOPSY Left 05/27/2016   BENIGN MAMMARY EPITHELIUM   BREAST BIOPSY Left 11/20/2017   affirm bx x clip BENIGN MAMMARY EPITHELIUM CONSISTENT WITH RAD THERAPY   BREAST LUMPECTOMY Left 10/17/2015   DCIS and papillary carcinoma insitu, clear margins   CARDIAC SURGERY     has replacement valve   CATARACT EXTRACTION Right    CHOLECYSTECTOMY     COLONOSCOPY WITH PROPOFOL N/A 03/11/2016   Procedure: COLONOSCOPY WITH PROPOFOL;  Surgeon: Manya Silvas, MD;  Location: [facility] ENDOSCOPY;  Service: Endoscopy;  Laterality: N/A;   COLONOSCOPY WITH PROPOFOL N/A 02/18/2020   Procedure: COLONOSCOPY WITH PROPOFOL;  Surgeon: Annette Lame, MD;  Location: [facility] ENDOSCOPY;  Service: Endoscopy;  Laterality: N/A;    ESOPHAGOGASTRODUODENOSCOPY (EGD) WITH PROPOFOL N/A 03/11/2016   Procedure: ESOPHAGOGASTRODUODENOSCOPY (EGD) WITH PROPOFOL;  Surgeon: Manya Silvas, MD;  Location: [facility] ENDOSCOPY;  Service: Endoscopy;  Laterality: N/A;   ESOPHAGOGASTRODUODENOSCOPY (EGD) WITH PROPOFOL N/A 02/18/2020   Procedure: ESOPHAGOGASTRODUODENOSCOPY (EGD) WITH PROPOFOL;  Surgeon: Annette Lame, MD;  Location: [facility] ENDOSCOPY;  Service: Endoscopy;  Laterality: N/A;   FINGER ARTHROSCOPY WITH CARPOMETACARPEL ([place]) ARTHROPLASTY Right 09/03/2018   Procedure: CARPOMETACARPEL [facility] ARTHROPLASTY RIGHT THUMB;  Surgeon: Hessie Knows, MD;  Location: [facility] ORS;  Service: Orthopedics;  Laterality: Right;   GANGLION CYST EXCISION Right 09/03/2018   Procedure: REMOVAL GANGLION OF WRIST;  Surgeon: Hessie Knows, MD;  Location: [facility] ORS;  Service: Orthopedics;  Laterality: Right;   HARDWARE REMOVAL Right 09/03/2018   Procedure: HARDWARE REMOVAL RIGHT THUMB;  Surgeon: Hessie Knows, MD;  Location: [facility] ORS;  Service: Orthopedics;  Laterality: Right;  staple removed   JOINT REPLACEMENT Left    TKR   left sinusplasty      MASTECTOMY, PARTIAL Left 10/17/2015   Procedure: MASTECTOMY PARTIAL REVISION;  Surgeon: Leonie Green, MD;  Location: [facility] ORS;  Service: General;  Laterality: Left;   PARTIAL MASTECTOMY WITH NEEDLE LOCALIZATION Left 09/29/2015   Procedure: PARTIAL MASTECTOMY WITH NEEDLE LOCALIZATION;  Surgeon: Leonie Green, MD;  Location: [facility] ORS;  Service: General;  Laterality: Left;  TOTAL ABDOMINAL HYSTERECTOMY W/ BILATERAL SALPINGOOPHORECTOMY      Prior to Admission medications   Medication Sig Start Date End Date Taking? Authorizing Provider  desloratadine (CLARINEX) 5 MG tablet Take 1 tablet by mouth daily.   Yes [provider]  fexofenadine (ALLEGRA) 180 MG tablet 1 tablet   Yes [provider]  levothyroxine (SYNTHROID, LEVOTHROID) 75 MCG tablet Take 75 mcg by mouth daily before breakfast.    Yes [provider]  ondansetron (ZOFRAN) 4 MG tablet Take 1 tablet (4 mg total) by mouth every 8 (eight) hours as needed for nausea or vomiting. 04/19/20  Yes Annette Lame, MD  acyclovir (ZOVIRAX) 400 MG tablet Take 1 tablet (400 mg total) by mouth 3 (three) times daily. For 10 days 03/14/20   Harlin Heys, MD  azelastine (OPTIVAR) 0.05 % ophthalmic solution 1 drop into affected eye    [provider]  benzonatate (TESSALON) 200 MG capsule Take 200 mg by mouth 3 (three) times daily as needed for cough.    [provider]  Cholecalciferol (VITAMIN D) 50 MCG (2000 UT) CAPS Take 2,000 Units by mouth daily.    [provider]  clindamycin (CLEOCIN) 150 MG capsule Take 300 mg by mouth 3 (three) times daily. 04/14/20   [provider]  clobetasol ointment (TEMOVATE) 9.92 % Apply 1 application topically 2 (two) times a week. 11/18/19   Harlin Heys, MD  cyanocobalamin (,VITAMIN B-12,) 1000 MCG/ML injection 1,000 mcg every 30 (thirty) days. 04/23/19   [provider]  dicyclomine (BENTYL) 10 MG capsule Take 10 mg by mouth at bedtime.  05/16/19   [provider]  docusate sodium (COLACE) 100 MG capsule Take 1 capsule by mouth 2 (two) times daily as needed. 01/10/20   [provider]  EPINEPHrine 0.3 mg/0.3 mL IJ SOAJ injection Inject 0.3 mg into the muscle as needed. 05/18/19   [provider]  escitalopram (LEXAPRO) 5 MG tablet Take 5 mg by mouth at bedtime.    [provider]  esomeprazole (NEXIUM) 40 MG capsule Take 40 mg by mouth 2 (two) times daily before a meal.  02/08/16   [provider]  estradiol (ESTRACE) 0.1 MG/GM vaginal cream Place 1 Applicatorful vaginally as needed.    [provider]  fenofibrate 160 MG tablet Take 160 mg by mouth at bedtime.    [provider]  fluticasone (FLONASE) 50 MCG/ACT nasal spray Place 1 spray into the nose daily as needed for allergies.    [provider]  gabapentin (NEURONTIN) 300 MG capsule Take 300 mg by mouth at bedtime.  09/07/16 10/25/36  [provider]  glimepiride (AMARYL) 2 MG tablet Take 2 mg by mouth at bedtime.    [provider]  hydrOXYzine (ATARAX/VISTARIL) 25 MG tablet 1 tablet as needed    [provider]  Hyoscyamine Sulfate 0.375 MG TBCR Take 0.375 mg by mouth 2 (two) times daily.     [provider]  Insulin Degludec (TRESIBA) 100 UNIT/ML SOLN Inject 54 mg into the skin at bedtime.    [provider]  magnesium oxide (MAG-OX) 400 MG tablet Take 400 mg by mouth 2 (two) times daily.     [provider]  NOVOLOG FLEXPEN 100 UNIT/ML FlexPen Inject 15 Units into the skin 3 (three) times daily with meals. Based on sliding scale 08/14/19   [provider]  potassium chloride SA (K-DUR,KLOR-CON) 20 MEQ tablet Take 20 mEq by mouth 2 (  two) times daily.     [provider]  saccharomyces boulardii (FLORASTOR) 250 MG capsule Take 250 mg by mouth 2 (two) times daily.     [provider]  Semaglutide,0.25 or 0.5MG/DOS, 2 MG/1.5ML SOPN Inject 5 mg into the skin once a week. On Sunday. 11/22/19   [provider]  simvastatin (ZOCOR) 20 MG tablet Take 20 mg by mouth at bedtime.    [provider]  spironolactone (ALDACTONE) 25 MG tablet Take 1 tablet (25 mg total) by mouth daily. 02/18/20   Danford, Suann Larry, MD  torsemide (DEMADEX) 20 MG tablet Take 1 tablet (20 mg total) by mouth daily. 02/29/20 05/29/20  Furth, Cadence H, PA-C  traMADol (ULTRAM) 50 MG tablet 1 tablet as needed    [provider]  valACYclovir (VALTREX) 500 MG tablet Take 500 mg by mouth See admin instructions. For fever blisters take 525m twice a day as needed 01/06/17   [provider]    Allergies as of 04/19/2020 - Review Complete 04/19/2020  Allergen Reaction Noted   Codeine Itching and Nausea And Vomiting 06/28/2014   Demeclocycline Rash 04/10/2012    Demerol [meperidine] Itching and Nausea And Vomiting 06/28/2014   Hydrocodone Itching and Nausea And Vomiting 08/24/2018   Oxycodone Itching and Nausea And Vomiting 09/26/2015   Pentazocine Itching and Nausea And Vomiting 04/10/2012   Tetracyclines & related Rash 06/28/2014   Fentanyl Itching and Nausea And Vomiting 06/28/2014    Family History  Problem Relation Age of Onset   Breast cancer Paternal Grandmother    Colon cancer Father    Diabetes Sister    Diabetes Brother    Heart disease Brother    Prostate cancer Brother    Colon cancer Maternal Uncle    Prostate cancer Brother    Bladder Cancer Brother    Leukemia Mother        all   Ovarian cancer Neg Hx    Kidney cancer Neg Hx     Social History   Socioeconomic History   Marital status: Married    Spouse name: Not on file   Number of children: Not on file   Years of education: Not on file   Highest education level: Not on file  Occupational History   Not on file  Tobacco Use   Smoking status: Never Smoker   Smokeless tobacco: Never Used  Vaping Use   Vaping Use: Never used  Substance and Sexual Activity   Alcohol use: No    Alcohol/week: 0.0 standard drinks   Drug use: No   Sexual activity: Not Currently    Birth control/protection: Surgical  Other Topics Concern   Not on file  Social History Narrative   Not on file   Social Determinants of Health   Financial Resource Strain: Not on file  Food Insecurity: Not on file  Transportation Needs: Not on file  Physical Activity: Not on file  Stress: Not on file  Social Connections: Not on file  Intimate Partner Violence: Not on file    Review of Systems: See HPI, otherwise negative ROS  Physical Exam: BP 137/65   Pulse (!) 52   Temp (!) 96.9 F (36.1 C) (Temporal)   Resp 16   Ht 5' 2"  (1.575 m)   Wt 69.4 kg   LMP  (LMP Unknown)   SpO2 98%   BMI 27.98 kg/m  General:   Alert,  pleasant and cooperative in NAD Head:  Normocephalic and  atraumatic. Neck:  Supple; no masses or thyromegaly. Lungs:  Clear throughout to auscultation.    Heart:  Regular rate and rhythm. Abdomen:  Soft, nontender and nondistended. Normal bowel sounds, without guarding, and without rebound.   Neurologic:  Alert and  oriented x4;  grossly normal neurologically.  Impression/Plan: Annette Foster is here for an endoscopy to be performed for treatment of varices  Risks, benefits, limitations, and alternatives regarding  endoscopy have been reviewed with the patient.  Questions have been answered.  All parties agreeable.   Annette Lame, MD  04/25/2020, 9:46 AM

## 2020-04-25 NOTE — Transfer of Care (Signed)
Immediate Anesthesia Transfer of Care Note  Patient: Annette Foster  Procedure(s) Performed: ESOPHAGOGASTRODUODENOSCOPY (EGD) WITH PROPOFOL (N/A )  Patient Location: Endoscopy Unit  Anesthesia Type:General  Level of Consciousness: drowsy  Airway & Oxygen Therapy: Patient Spontanous Breathing  Post-op Assessment: Report given to RN and Post -op Vital signs reviewed and stable  Post vital signs: Reviewed and stable  Last Vitals:  Vitals Value Taken Time  BP 149/55 04/25/20 1054  Temp 36.4 C 04/25/20 1053  Pulse 57 04/25/20 1055  Resp 17 04/25/20 1055  SpO2 95 % 04/25/20 1055  Vitals shown include unvalidated device data.  Last Pain:  Vitals:   04/25/20 1053  TempSrc: Temporal  PainSc: Asleep         Complications: No complications documented.

## 2020-04-25 NOTE — Anesthesia Postprocedure Evaluation (Signed)
Anesthesia Post Note  Patient: Annette Foster  Procedure(s) Performed: ESOPHAGOGASTRODUODENOSCOPY (EGD) WITH PROPOFOL (N/A )  Patient location during evaluation: Endoscopy Anesthesia Type: General Level of consciousness: awake and alert Pain management: pain level controlled Vital Signs Assessment: post-procedure vital signs reviewed and stable Respiratory status: spontaneous breathing, nonlabored ventilation, respiratory function stable and patient connected to nasal cannula oxygen Cardiovascular status: blood pressure returned to baseline and stable Postop Assessment: no apparent nausea or vomiting Anesthetic complications: no   No complications documented.   Last Vitals:  Vitals:   04/25/20 1103 04/25/20 1123  BP: (!) 148/62 (!) 176/75  Pulse:    Resp:    Temp:    SpO2:      Last Pain:  Vitals:   04/25/20 1123  TempSrc:   PainSc: 0-No pain                 Precious Haws Piscitello

## 2020-04-26 ENCOUNTER — Other Ambulatory Visit: Payer: Self-pay

## 2020-04-26 ENCOUNTER — Ambulatory Visit (INDEPENDENT_AMBULATORY_CARE_PROVIDER_SITE_OTHER): Payer: Medicare PPO | Admitting: Cardiology

## 2020-04-26 ENCOUNTER — Encounter: Payer: Self-pay | Admitting: Cardiology

## 2020-04-26 VITALS — BP 120/70 | HR 124 | Ht 62.0 in | Wt 163.4 lb

## 2020-04-26 DIAGNOSIS — I4891 Unspecified atrial fibrillation: Secondary | ICD-10-CM | POA: Diagnosis not present

## 2020-04-26 DIAGNOSIS — K7460 Unspecified cirrhosis of liver: Secondary | ICD-10-CM

## 2020-04-26 DIAGNOSIS — I8500 Esophageal varices without bleeding: Secondary | ICD-10-CM | POA: Diagnosis not present

## 2020-04-26 DIAGNOSIS — I5032 Chronic diastolic (congestive) heart failure: Secondary | ICD-10-CM | POA: Diagnosis not present

## 2020-04-26 DIAGNOSIS — R188 Other ascites: Secondary | ICD-10-CM

## 2020-04-26 MED ORDER — NEBIVOLOL HCL 2.5 MG PO TABS: 2.5000 mg | ORAL_TABLET | Freq: Every day | ORAL | 3 refills | Status: DC

## 2020-04-26 NOTE — Progress Notes (Signed)
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 Was starting up and she was to see her md today.I encoraged her to keep her appointment and she stated she would.

## 2020-04-26 NOTE — Patient Instructions (Addendum)
Medication Instructions:  Your physician has recommended you make the following change in your medication:   1.  START taking nebivolol 2.5 mg- Take one tablet by mouth daily  *If you need a refill on your cardiac medications before your next appointment, please call your pharmacy*  Lab Work: None ordered. If you have labs (blood work) drawn today and your tests are completely normal, you will receive your results only by: Marland Kitchen MyChart Message (if you have MyChart) OR . A paper copy in the mail If you have any lab test that is abnormal or we need to change your treatment, we will call you to review the results.  Testing/Procedures: None ordered.  Follow-Up: At [facility], you and your health needs are our priority.  As part of our continuing mission to provide you with exceptional heart care, we have created designated Provider Care Teams.  These Care Teams include your primary Cardiologist (physician) and Advanced Practice Providers (APPs -  Physician Assistants and Nurse Practitioners) who all work together to provide you with the care you need, when you need it.  Your next appointment:   Your physician wants you to follow-up in: 8 weeks with Dr. Quentin Ore.     Nebivolol Oral Tablets What is this medicine? NEBIVOLOL (ne BIV oh lol) is a beta blocker. It decreases the amount of work your heart has to do and helps your heart beat regularly. It treats high blood pressure. This medicine may be used for other purposes; ask your health care provider or pharmacist if you have questions. COMMON BRAND NAME(S): Bystolic What should I tell my health care provider before I take this medicine? They need to know if you have any of these conditions:  diabetes  heart or vessel disease like slow heartrate, worsening heart failure, heart block, sick sinus syndrome or Raynaud's disease  kidney disease  liver disease  lung disease like asthma or emphysema  pheochromocytoma  thyroid  disease  an unusual or allergic reaction to nebivolol, other beta-blockers, medicines, foods, dyes, or preservatives  pregnant or trying to get pregnant  breast-feeding How should I use this medicine? Take this drug by mouth. Take it as directed on the prescription label at the same time every day. Keep taking it unless your health care provider tells you to stop. Talk to your health care provider about the use of this drug in children. Special care may be needed. Overdosage: If you think you have taken too much of this medicine contact a poison control center or emergency room at once. NOTE: This medicine is only for you. Do not share this medicine with others. What if I miss a dose? If you miss a dose, take it as soon as you can. If it is almost time for your next dose, take only that dose. Do not take double or extra doses. What may interact with this medicine? This medicine may interact with the following medications:  certain medicines for blood pressure, heart disease, irregular heart beat  certain medicines for depression, like fluoxetine or paroxetine  cimetidine  clonidine  reserpine  sildenafil This list may not describe all possible interactions. Give your health care provider a list of all the medicines, herbs, non-prescription drugs, or dietary supplements you use. Also tell them if you smoke, drink alcohol, or use illegal drugs. Some items may interact with your medicine. What should I watch for while using this medicine? Visit your doctor or health care professional for regular checks on your progress. Check  your heart rate and blood pressure regularly while you are taking this medicine. Ask your doctor or health care professional what your heart rate and blood pressure should be, and when you should contact him or her. You may get drowsy or dizzy. Do not drive, use machinery, or do anything that needs mental alertness until you know how this drug affects you. Do not  stand or sit up quickly, especially if you are an older patient. This reduces the risk of dizzy or fainting spells. Alcohol can make you more drowsy and dizzy. Avoid alcoholic drinks. This medicine may increase blood sugar. Ask your healthcare provider if changes in diet or medicines are needed if you have diabetes. Do not treat yourself for coughs, colds, or pain while you are taking this medicine without asking your doctor or health care professional for advice. Some ingredients may increase your blood pressure. What side effects may I notice from receiving this medicine? Side effects that you should report to your doctor or health care provider as soon as possible:  allergic reactions like skin rash, itching or hives, swelling of the face, lips, or tongue  breathing problems  chest pain  cold, tingling, or numb hands or feet  high blood sugar (increased hunger, thirst or urination; unusually weak or tired, blurry vision)  irregular heartbeat  liver injury (dark yellow or brown urine; general ill feeling or flu-like symptoms; loss of appetite; right upper belly pain; unusually weak or tired; yellowing of the eyes or skin)  slow heart rate  swollen legs or ankles  unusual bleeding or bruising  vomiting Side effects that usually do not require medical attention (report to your doctor or health care provider if they continue or are bothersome):  diarrhea  dizziness  dry or burning eyes  headache  nausea  tiredness  trouble sleeping This list may not describe all possible side effects. Call your doctor for medical advice about side effects. You may report side effects to FDA at 1-800-FDA-1088. Where should I keep my medicine? Keep out of the reach of children and pets. Store at room temperature between 20 and 25 degrees C (68 and 77 degrees F). Throw away any unused drug after the expiration date. NOTE: This sheet is a summary. It may not cover all possible information. If  you have questions about this medicine, talk to your doctor, pharmacist, or health care provider.  2021 Elsevier/Gold Standard (2019-05-26 09:38:36)

## 2020-04-26 NOTE — Progress Notes (Signed)
Electrophysiology Office Note:    Date:  04/26/2020   ID:  Annette Foster, Annette Foster February 10, 1941, MRN [id number]  PCP:  Albina Billet, MD  [facility] Cardiologist:  Nelva Bush, MD  [place] Electrophysiologist:  None   Referring MD: Nelva Bush, MD   Chief Complaint: PAF and GI Bleeding  History of Present Illness:    Annette Foster is a 80 y.o. 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 Saunders Revel. 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  She has a history of prior cardiac surgery (mitral valve commissurotomy) at [facility] in the 1970s.  The patient is currently undergoing treatment for esophageal varices.  She actually had an EGD with esophageal varices banding performed yesterday.  The plan according to the procedural note is to pursue repeat banding procedure in 4 weeks.  She tells me that she does not believe that she is actively bleeding at this time.  Past Medical History:  Diagnosis Date  . Abdominal pain   . Allergy   . Cancer [facility]) 2017   breast cancer- Left  . Cataract   . CHF (congestive heart failure) ([place])   . Collagenous colitis   . Diabetes mellitus without complication ([place])   . Diarrhea   . Diverticulosis   . Fatty liver disease, nonalcoholic   . Fibrocystic breast   . GERD (gastroesophageal reflux disease)   . Heart murmur   . Hyperlipidemia   . Hypertension   . Hypothyroidism   . IBS (irritable bowel syndrome)   . IDA (iron deficiency anemia)   . Personal history of radiation therapy 2017   LEFT BREAST CA  . PONV (postoperative nausea and vomiting)   . Sleep apnea    C-Pap    Past Surgical History:  Procedure Laterality Date  . ABDOMINAL HYSTERECTOMY     tah bso  . ABDOMINAL SURGERY    . APPENDECTOMY    . BREAST BIOPSY Bilateral 2016    negative  . 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 BREAST BIOPSY Left 05/27/2016   BENIGN MAMMARY EPITHELIUM  . BREAST BIOPSY Left 11/20/2017   affirm bx x clip BENIGN MAMMARY EPITHELIUM CONSISTENT WITH RAD THERAPY  . BREAST LUMPECTOMY Left 10/17/2015   DCIS and papillary carcinoma insitu, clear margins  . CARDIAC SURGERY     has replacement valve  . CATARACT EXTRACTION Right   . CHOLECYSTECTOMY    . COLONOSCOPY WITH PROPOFOL N/A 03/11/2016   Procedure: COLONOSCOPY WITH PROPOFOL;  Surgeon: Manya Silvas, MD;  Location: [facility] ENDOSCOPY;  Service: Endoscopy;  Laterality: N/A;  . COLONOSCOPY WITH PROPOFOL N/A 02/18/2020   Procedure: COLONOSCOPY WITH PROPOFOL;  Surgeon: [name], MD;  Location: [facility] ENDOSCOPY;  Service: Endoscopy;  Laterality: N/A;  . ESOPHAGOGASTRODUODENOSCOPY (EGD) WITH PROPOFOL N/A 03/11/2016   Procedure: ESOPHAGOGASTRODUODENOSCOPY (EGD) WITH PROPOFOL;  Surgeon: Manya Silvas, MD;  Location: [facility] ENDOSCOPY;  Service: Endoscopy;  Laterality: N/A;  . ESOPHAGOGASTRODUODENOSCOPY (EGD) WITH PROPOFOL N/A 02/18/2020   Procedure: ESOPHAGOGASTRODUODENOSCOPY (EGD) WITH PROPOFOL;  Surgeon: [name], MD;  Location: [facility] ENDOSCOPY;  Service: Endoscopy;  Laterality: N/A;  . FINGER ARTHROSCOPY WITH CARPOMETACARPEL ([place]) ARTHROPLASTY Right 09/03/2018   Procedure: CARPOMETACARPEL [facility]) ARTHROPLASTY RIGHT THUMB;  Surgeon: Hessie Knows, MD;  Location: [facility] ORS;  Service: Orthopedics;  Laterality: Right;  . GANGLION CYST EXCISION  Right 09/03/2018   Procedure: REMOVAL GANGLION OF WRIST;  Surgeon: Hessie Knows, MD;  Location: [facility] ORS;  Service: Orthopedics;  Laterality: Right;  . HARDWARE REMOVAL Right 09/03/2018   Procedure: HARDWARE REMOVAL RIGHT THUMB;  Surgeon: Hessie Knows, MD;  Location: [facility] ORS;  Service: Orthopedics;  Laterality: Right;  staple removed  . JOINT REPLACEMENT Left    TKR  . left sinusplasty     . MASTECTOMY, PARTIAL Left 10/17/2015    Procedure: MASTECTOMY PARTIAL REVISION;  Surgeon: Leonie Green, MD;  Location: [facility] ORS;  Service: General;  Laterality: Left;  . PARTIAL MASTECTOMY WITH NEEDLE LOCALIZATION Left 09/29/2015   Procedure: PARTIAL MASTECTOMY WITH NEEDLE LOCALIZATION;  Surgeon: Leonie Green, MD;  Location: [facility] ORS;  Service: General;  Laterality: Left;  . TOTAL ABDOMINAL HYSTERECTOMY W/ BILATERAL SALPINGOOPHORECTOMY      Current Medications: Current Meds  Medication Sig  . azelastine (OPTIVAR) 0.05 % ophthalmic solution 1 drop into affected eye  . benzonatate (TESSALON) 200 MG capsule Take 200 mg by mouth 3 (three) times daily as needed for cough.  . Cholecalciferol (VITAMIN D) 50 MCG (2000 UT) CAPS Take 2,000 Units by mouth daily.  . clobetasol ointment (TEMOVATE) 9.62 % Apply 1 application topically 2 (two) times a week.  . cyanocobalamin (,VITAMIN B-12,) 1000 MCG/ML injection 1,000 mcg every 30 (thirty) days.  Marland Kitchen desloratadine (CLARINEX) 5 MG tablet Take 1 tablet by mouth daily.  Marland Kitchen dicyclomine (BENTYL) 10 MG capsule Take 10 mg by mouth at bedtime.   . docusate sodium (COLACE) 100 MG capsule Take 1 capsule by mouth 2 (two) times daily as needed.  Marland Kitchen EPINEPHrine 0.3 mg/0.3 mL IJ SOAJ injection Inject 0.3 mg into the muscle as needed.  Marland Kitchen escitalopram (LEXAPRO) 5 MG tablet Take 5 mg by mouth at bedtime.  Marland Kitchen esomeprazole (NEXIUM) 40 MG capsule Take 40 mg by mouth 2 (two) times daily before a meal.   . estradiol (ESTRACE) 0.1 MG/GM vaginal cream Place 1 Applicatorful vaginally as needed.  . fenofibrate 160 MG tablet Take 160 mg by mouth at bedtime.  . fexofenadine (ALLEGRA) 180 MG tablet 1 tablet  . fluticasone (FLONASE) 50 MCG/ACT nasal spray Place 1 spray into the nose daily as needed for allergies.  Marland Kitchen gabapentin (NEURONTIN) 300 MG capsule Take 300 mg by mouth at bedtime.   Marland Kitchen glimepiride (AMARYL) 2 MG tablet Take 2 mg by mouth at bedtime.  . hydrOXYzine (ATARAX/VISTARIL) 25 MG tablet 1 tablet as  needed  . Hyoscyamine Sulfate 0.375 MG TBCR Take 0.375 mg by mouth 2 (two) times daily.   . Insulin Degludec (TRESIBA) 100 UNIT/ML SOLN Inject 54 mg into the skin at bedtime.  Marland Kitchen levothyroxine (SYNTHROID, LEVOTHROID) 75 MCG tablet Take 75 mcg by mouth daily before breakfast.   . magnesium oxide (MAG-OX) 400 MG tablet Take 400 mg by mouth 2 (two) times daily.   Marland Kitchen NOVOLOG FLEXPEN 100 UNIT/ML FlexPen Inject 15 Units into the skin 3 (three) times daily with meals. Based on sliding scale  . ondansetron (ZOFRAN) 4 MG tablet Take 1 tablet (4 mg total) by mouth every 8 (eight) hours as needed for nausea or vomiting.  . potassium chloride SA (K-DUR,KLOR-CON) 20 MEQ tablet Take 20 mEq by mouth 2 (two) times daily.   Marland Kitchen saccharomyces boulardii (FLORASTOR) 250 MG capsule Take 250 mg by mouth 2 (two) times daily.   . Semaglutide,0.25 or 0.5MG/DOS, 2 MG/1.5ML SOPN Inject 5 mg into the skin once a week. On Sunday.  Marland Kitchen  simvastatin (ZOCOR) 20 MG tablet Take 20 mg by mouth at bedtime.  Marland Kitchen spironolactone (ALDACTONE) 25 MG tablet Take 1 tablet (25 mg total) by mouth daily.  Marland Kitchen torsemide (DEMADEX) 20 MG tablet Take 1 tablet (20 mg total) by mouth daily.  . valACYclovir (VALTREX) 500 MG tablet Take 500 mg by mouth See admin instructions. For fever blisters take 532m twice a day as needed     Allergies:   Codeine, Demeclocycline, Demerol [meperidine], Hydrocodone, Oxycodone, Pentazocine, Tetracyclines & related, and Fentanyl   Social History   Socioeconomic History  . Marital status: Married    Spouse name: Not on file  . Number of children: Not on file  . Years of education: Not on file  . Highest education level: Not on file  Occupational History  . Not on file  Tobacco Use  . Smoking status: Never Smoker  . Smokeless tobacco: Never Used  Vaping Use  . Vaping Use: Never used  Substance and Sexual Activity  . Alcohol use: No    Alcohol/week: 0.0 standard drinks  . Drug use: No  . Sexual activity: Not  Currently    Birth control/protection: Surgical  Other Topics Concern  . Not on file  Social History Narrative  . Not on file   Social Determinants of Health   Financial Resource Strain: Not on file  Food Insecurity: Not on file  Transportation Needs: Not on file  Physical Activity: Not on file  Stress: Not on file  Social Connections: Not on file     Family History: The patient's family history includes Bladder Cancer in her brother; Breast cancer in her paternal grandmother; Colon cancer in her father and maternal uncle; Diabetes in her brother and sister; Heart disease in her brother; Leukemia in her mother; Prostate cancer in her brother and brother. There is no history of Ovarian cancer or Kidney cancer.  ROS:   Please see the history of present illness.    All other systems reviewed and are negative.  EKGs/Labs/Other Studies Reviewed:    The following studies were reviewed today:  04/25/2020 EGD - Grade II esophageal varices. Incompletely eradicated. Banded. - Portal hypertensive gastropathy. 4 week return EGD recommended  03/15/2020 Monitor  The patient was monitored for 14 days.  The predominant rhythm was sinus with an average rate of 55 bpm (range 42-96 bpm in sinus).  There were occasional PAC's and rare PVC's.  16 atrial runs lasting up to 12 beats occurred with a maximum rate of 164 bpm.  There was a single episode of nonsustained ventricular tachycardia lasting 6 beats with a maximum rate of 152 bpm.  No sustained arrhythmia or prolonged pause was observed.  Patient triggered events correspond to sinus rhythm, PAC's, and PVC's.   02/08/2020 Echo personally reviewed LV fx normal 55% RV mildly reduced fx Mod dil LA      EKG:  The ekg ordered today demonstrates atrial flutter with RVR, rates 124bpm.  Recent Labs: 02/07/2020: Magnesium 2.3; TSH 3.169 02/16/2020: B Natriuretic Peptide 377.8 04/07/2020: ALT 21; BUN 18; Creatinine, Ser 0.90; Hemoglobin  12.3; Platelets 75; Potassium 3.9; Sodium 134  Recent Lipid Panel No results found for: CHOL, TRIG, HDL, CHOLHDL, VLDL, LDLCALC, LDLDIRECT  Physical Exam:    VS:  BP 120/70   Pulse (!) 124   Ht 5' 2"  (1.575 m)   Wt 163 lb 6.4 oz (74.1 kg)   LMP  (LMP Unknown)   SpO2 96%   BMI 29.89 kg/m  Wt Readings from Last 3 Encounters:  04/26/20 163 lb 6.4 oz (74.1 kg)  04/25/20 153 lb (69.4 kg)  04/19/20 160 lb 9.6 oz (72.8 kg)     GEN:  Well nourished, well developed in no acute distress HEENT: Normal NECK: No JVD; No carotid bruits LYMPHATICS: No lymphadenopathy CARDIAC: tachycardic, irregularly irregular, no murmurs, rubs, gallops RESPIRATORY:  Clear to auscultation without rales, wheezing or rhonchi  ABDOMEN: Soft, non-tender, non-distended MUSCULOSKELETAL:  No edema; No deformity  SKIN: Warm and dry NEUROLOGIC:  Alert and oriented x 3 PSYCHIATRIC:  Normal affect   ASSESSMENT:    1. Esophageal varices without bleeding, unspecified esophageal varices type ([place])   2. Atrial fibrillation with RVR ([place])   3. Chronic heart failure with preserved ejection fraction (HFpEF) ([place])   4. Cirrhosis of liver with ascites, unspecified hepatic cirrhosis type ([place])    PLAN:    In order of problems listed above:  1. Atrial fibrillation with RVR and atrial flutter Out of rhythm today.  She tells me that it started this morning.  We will plan to restart beta-blocker with nebivolol 2.5 mg daily.  Unfortunately because she is not on anticoagulation I cannot safely consider cardioversion.  She is mildly symptomatic.  We discussed the watchman procedure during today's visit.  I do think this would be a good long-term solution for her to help provide some stroke protection related to her atrial fibrillation in the setting of her history of GI bleeding related to esophageal varices.  She is currently being treated for esophageal varices and had an EGD with banding done yesterday.  Her GI doctor is  planning for repeat EGD in 4 weeks.  I would like her esophageal varices to be completely treated prior to pursuing the watchman implant.  She will require a short course of anticoagulation (3 weeks prior to [facility] implant and for 45 days after implant).  I will plan to see her back in approximately 8 weeks.  At that appointment, plan to reassess progress with EGD and banding procedures.  We will discuss proceeding with CT scan and scheduling watchman implant.  2.  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  Last platelet count was 75,000 on February 25.  Follow-up 8 weeks   Medication Adjustments/Labs and Tests Ordered: Current medicines are reviewed at length with the patient today.  Concerns regarding medicines are outlined above.  No orders of the defined types were placed in this encounter.  No orders of the defined types were placed in this encounter.    Signed, Lars Mage, MD, [facility]  04/26/2020 10:01 AM    Electrophysiology [facility]

## 2020-05-01 ENCOUNTER — Encounter: Payer: Self-pay | Admitting: Podiatry

## 2020-05-01 ENCOUNTER — Ambulatory Visit (INDEPENDENT_AMBULATORY_CARE_PROVIDER_SITE_OTHER): Payer: Medicare PPO | Admitting: Podiatry

## 2020-05-01 ENCOUNTER — Other Ambulatory Visit: Payer: Self-pay

## 2020-05-01 DIAGNOSIS — M2010 Hallux valgus (acquired), unspecified foot: Secondary | ICD-10-CM | POA: Diagnosis not present

## 2020-05-01 DIAGNOSIS — M2041 Other hammer toe(s) (acquired), right foot: Secondary | ICD-10-CM | POA: Diagnosis not present

## 2020-05-01 DIAGNOSIS — M79675 Pain in left toe(s): Secondary | ICD-10-CM

## 2020-05-01 DIAGNOSIS — M2042 Other hammer toe(s) (acquired), left foot: Secondary | ICD-10-CM

## 2020-05-01 DIAGNOSIS — E1142 Type 2 diabetes mellitus with diabetic polyneuropathy: Secondary | ICD-10-CM

## 2020-05-01 DIAGNOSIS — B351 Tinea unguium: Secondary | ICD-10-CM

## 2020-05-01 DIAGNOSIS — M79674 Pain in right toe(s): Secondary | ICD-10-CM

## 2020-05-01 NOTE — Progress Notes (Signed)
This patient returns to my office for at risk foot care.  This patient requires this care by a professional since this patient will be at risk due to having diabetes  This patient is unable to cut nails himself since the patient cannot reach his nails.These nails are painful walking and wearing shoes.  This patient presents for at risk foot care today.    General Appearance  Alert, conversant and in no acute stress.  Vascular  Dorsalis pedis and posterior tibial  pulses are palpable  bilaterally.  Capillary return is within normal limits  bilaterally. Temperature is within normal limits  bilaterally.  Neurologic  Senn-Weinstein monofilament test dimininished  bilaterally. Muscle power within normal limits bilaterally.  Nails Thick disfigured discolored nails with subungual debris  from hallux to fifth toes bilaterally. No evidence of bacterial infection or drainage bilaterally.  Orthopedic  No limitations of motion  feet .  No crepitus or effusions noted.  No bony pathology or digital deformities noted. HAV  B/L.  Hammer toes  B/L.  Midfoot DJD  B/L.  Skin  normotropic skin with no porokeratosis noted bilaterally.  No signs of infections or ulcers noted.     Onychomycosis  Pain in right toes  Pain in left toes  Consent was obtained for treatment procedures.   Mechanical debridement of nails 1-5  bilaterally performed with a nail nipper.  Filed with dremel without incident.     Return office visit    10 weeks                 Told patient to return for periodic foot care and evaluation due to potential at risk complications.   Gardiner Barefoot DPM

## 2020-05-05 ENCOUNTER — Telehealth: Payer: Self-pay | Admitting: Gastroenterology

## 2020-05-05 NOTE — Telephone Encounter (Signed)
Patient called LVM to call her back to schedule her next procedure please.

## 2020-05-08 ENCOUNTER — Other Ambulatory Visit: Payer: Self-pay

## 2020-05-08 DIAGNOSIS — I8500 Esophageal varices without bleeding: Secondary | ICD-10-CM

## 2020-05-08 NOTE — Telephone Encounter (Signed)
Pt scheduled for a repeat EGD with Dr. Allen Norris at [facility] on 05/23/20. Instructions have been sent through [facility] and mailed per pt request.

## 2020-05-16 ENCOUNTER — Inpatient Hospital Stay: Payer: Medicare PPO | Attending: Internal Medicine

## 2020-05-16 ENCOUNTER — Other Ambulatory Visit: Payer: Self-pay

## 2020-05-16 ENCOUNTER — Inpatient Hospital Stay (HOSPITAL_BASED_OUTPATIENT_CLINIC_OR_DEPARTMENT_OTHER): Payer: Medicare PPO | Admitting: Internal Medicine

## 2020-05-16 ENCOUNTER — Inpatient Hospital Stay: Payer: Medicare PPO

## 2020-05-16 VITALS — BP 99/72 | HR 124 | Temp 96.5°F | Resp 18 | Ht 62.0 in | Wt 163.5 lb

## 2020-05-16 DIAGNOSIS — E119 Type 2 diabetes mellitus without complications: Secondary | ICD-10-CM | POA: Insufficient documentation

## 2020-05-16 DIAGNOSIS — Z86000 Personal history of in-situ neoplasm of breast: Secondary | ICD-10-CM | POA: Diagnosis not present

## 2020-05-16 DIAGNOSIS — Z803 Family history of malignant neoplasm of breast: Secondary | ICD-10-CM | POA: Insufficient documentation

## 2020-05-16 DIAGNOSIS — Z923 Personal history of irradiation: Secondary | ICD-10-CM | POA: Insufficient documentation

## 2020-05-16 DIAGNOSIS — K7581 Nonalcoholic steatohepatitis (NASH): Secondary | ICD-10-CM | POA: Diagnosis not present

## 2020-05-16 DIAGNOSIS — Z800 Family history of malignant neoplasm of digestive organs: Secondary | ICD-10-CM | POA: Diagnosis not present

## 2020-05-16 DIAGNOSIS — Z794 Long term (current) use of insulin: Secondary | ICD-10-CM | POA: Insufficient documentation

## 2020-05-16 DIAGNOSIS — Z806 Family history of leukemia: Secondary | ICD-10-CM | POA: Insufficient documentation

## 2020-05-16 DIAGNOSIS — D649 Anemia, unspecified: Secondary | ICD-10-CM | POA: Insufficient documentation

## 2020-05-16 DIAGNOSIS — K766 Portal hypertension: Secondary | ICD-10-CM | POA: Insufficient documentation

## 2020-05-16 DIAGNOSIS — Z8052 Family history of malignant neoplasm of bladder: Secondary | ICD-10-CM | POA: Insufficient documentation

## 2020-05-16 DIAGNOSIS — D61818 Other pancytopenia: Secondary | ICD-10-CM

## 2020-05-16 DIAGNOSIS — K7460 Unspecified cirrhosis of liver: Secondary | ICD-10-CM | POA: Insufficient documentation

## 2020-05-16 DIAGNOSIS — K922 Gastrointestinal hemorrhage, unspecified: Secondary | ICD-10-CM | POA: Insufficient documentation

## 2020-05-16 DIAGNOSIS — Z8042 Family history of malignant neoplasm of prostate: Secondary | ICD-10-CM | POA: Insufficient documentation

## 2020-05-16 DIAGNOSIS — I4891 Unspecified atrial fibrillation: Secondary | ICD-10-CM | POA: Insufficient documentation

## 2020-05-16 DIAGNOSIS — R5383 Other fatigue: Secondary | ICD-10-CM | POA: Insufficient documentation

## 2020-05-16 DIAGNOSIS — E611 Iron deficiency: Secondary | ICD-10-CM | POA: Diagnosis not present

## 2020-05-16 LAB — BASIC METABOLIC PANEL
Anion gap: 9 (ref 5–15)
BUN: 16 mg/dL (ref 8–23)
CO2: 28 mmol/L (ref 22–32)
Calcium: 8.8 mg/dL — ABNORMAL LOW (ref 8.9–10.3)
Chloride: 96 mmol/L — ABNORMAL LOW (ref 98–111)
Creatinine, Ser: 0.86 mg/dL (ref 0.44–1.00)
GFR, Estimated: >60 mL/min (ref >60)
Glucose, Bld: 393 mg/dL — ABNORMAL HIGH (ref 70–99)
Potassium: 4.4 mmol/L (ref 3.5–5.1)
Sodium: 133 mmol/L — ABNORMAL LOW (ref 135–145)

## 2020-05-16 LAB — CBC WITH DIFFERENTIAL/PLATELET
Abs Immature Granulocytes: 0 10*3/uL (ref 0.00–0.07)
Basophils Absolute: 0 10*3/uL (ref 0.0–0.1)
Basophils Relative: 0 %
Eosinophils Absolute: 0.1 10*3/uL (ref 0.0–0.5)
Eosinophils Relative: 3 %
HCT: 36.2 % (ref 36.0–46.0)
Hemoglobin: 12 g/dL (ref 12.0–15.0)
Immature Granulocytes: 0 %
Lymphocytes Relative: 41 %
Lymphs Abs: 0.8 10*3/uL (ref 0.7–4.0)
MCH: 30.5 pg (ref 26.0–34.0)
MCHC: 33.1 g/dL (ref 30.0–36.0)
MCV: 92.1 fL (ref 80.0–100.0)
Monocytes Absolute: 0.2 10*3/uL (ref 0.1–1.0)
Monocytes Relative: 10 %
Neutro Abs: 0.9 10*3/uL — ABNORMAL LOW (ref 1.7–7.7)
Neutrophils Relative %: 46 %
Platelets: 70 10*3/uL — ABNORMAL LOW (ref 150–400)
RBC: 3.93 MIL/uL (ref 3.87–5.11)
RDW: 14.3 % (ref 11.5–15.5)
WBC: 2 10*3/uL — ABNORMAL LOW (ref 4.0–10.5)
nRBC: 0 % (ref 0.0–0.2)

## 2020-05-16 NOTE — Assessment & Plan Note (Addendum)
#   Anemia-secondary GI bleed chronic.Marland Kitchen  Hemoglobin-12.0.  Hold off IV iron infusion.  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  ANC-900; Platelets 70s. STABLE.   # Tachy-brady syndrome- Hx of A.fib [awaiting evaluation with Dr.End]-defer to cardiology regarding management.  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 Mammogram December 2021-within normal limits.  Stable.  # DISPOSITION:  # NO venofer today # follow up in 4 month- MD; labs- cbc/cmp; AFP;iron studies/ferritin; possible Venofer-Dr.B

## 2020-05-16 NOTE — Progress Notes (Signed)
[place] OFFICE PROGRESS NOTE  Patient Care Team: Albina Billet, MD as PCP - General (Internal Medicine) End, Harrell Gave, MD as PCP - Cardiology (Cardiology) Vickie Epley, MD as PCP - Electrophysiology (Cardiology)  Cancer Staging No matching staging information was found for the patient.   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 OCT 2018- BMBx- MILD dyspoiesis; variable cellularity [10-50%]; FISH/Cytogenetics-NORMAL; F-One-NGS-declined by insurance.    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 Brandon/uro-gyne,[facility]]  # HRT [clinical trial thru [place]; stopped July 2017 ]; CPAP   DIAGNOSIS: Left breast DCIS  STAGE:    0     ;GOALS: cure  CURRENT/MOST RECENT THERAPY: surveillaince    Ductal carcinoma in situ (DCIS) of left breast    INTERVAL HISTORY:  Annette Foster 80 y.o.  female pleasant patient above history of DCIS; cirrhosis portal hypertension; A. fib and pancytopenia is here for follow-up.  In the interim patient had EGD with banding of varices.  She denies any chest pain.  Denies any palpitations.  No syncopal episodes.  Ongoing mild fatigue.   Review of Systems  Constitutional: Positive for malaise/fatigue. Negative for chills, diaphoresis, fever and weight loss.  HENT: Negative for nosebleeds and sore throat.   Eyes: Negative for double vision.  Respiratory: Negative for cough, hemoptysis, sputum production, shortness of breath and wheezing.   Cardiovascular: Negative for chest pain, palpitations, orthopnea and leg swelling.  Gastrointestinal: Negative for abdominal pain, blood  in stool, constipation, diarrhea, heartburn, melena, nausea and vomiting.  Musculoskeletal: Positive for back pain and joint pain.  Skin: Negative.  Negative for itching and rash.  Neurological: Negative for dizziness, tingling, focal weakness, weakness and headaches.  Endo/Heme/Allergies: Does not bruise/bleed easily.  Psychiatric/Behavioral: Negative for depression. The patient is not nervous/anxious and does not have insomnia.       PAST MEDICAL HISTORY :  Past Medical History:  Diagnosis Date  . Abdominal pain   . Allergy   . Cancer [facility]) 2017   breast cancer- Left  . Cataract   . CHF (congestive heart failure) ([place])   . Collagenous colitis   . Diabetes mellitus without complication ([place])   . Diarrhea   . Diverticulosis   . Fatty liver disease, nonalcoholic   . Fibrocystic breast   . GERD (gastroesophageal reflux disease)   . Heart murmur   . Hyperlipidemia   . Hypertension   . Hypothyroidism   . IBS (irritable bowel syndrome)   . IDA (iron deficiency anemia)   . Personal history of radiation therapy 2017   LEFT BREAST CA  . PONV (postoperative nausea and vomiting)   . Sleep apnea    C-Pap    PAST SURGICAL HISTORY :   Past Surgical History:  Procedure Laterality Date  . ABDOMINAL HYSTERECTOMY     [place] bso  . ABDOMINAL SURGERY    . APPENDECTOMY    . BREAST BIOPSY Bilateral 2016   negative  . 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 BREAST BIOPSY Left 05/27/2016   BENIGN MAMMARY EPITHELIUM  . BREAST BIOPSY Left 11/20/2017   affirm bx x clip BENIGN MAMMARY EPITHELIUM CONSISTENT WITH RAD THERAPY  .  BREAST LUMPECTOMY Left 10/17/2015   DCIS and papillary carcinoma insitu, clear margins  . CARDIAC SURGERY     has replacement valve  . CATARACT EXTRACTION Right   . CHOLECYSTECTOMY    . COLONOSCOPY WITH PROPOFOL N/A 03/11/2016   Procedure: COLONOSCOPY WITH PROPOFOL;  Surgeon: Manya Silvas, MD;  Location: [facility] ENDOSCOPY;  Service:  Endoscopy;  Laterality: N/A;  . COLONOSCOPY WITH PROPOFOL N/A 02/18/2020   Procedure: COLONOSCOPY WITH PROPOFOL;  Surgeon: [name], MD;  Location: [facility] ENDOSCOPY;  Service: Endoscopy;  Laterality: N/A;  . ESOPHAGOGASTRODUODENOSCOPY (EGD) WITH PROPOFOL N/A 03/11/2016   Procedure: ESOPHAGOGASTRODUODENOSCOPY (EGD) WITH PROPOFOL;  Surgeon: Manya Silvas, MD;  Location: [facility] ENDOSCOPY;  Service: Endoscopy;  Laterality: N/A;  . ESOPHAGOGASTRODUODENOSCOPY (EGD) WITH PROPOFOL N/A 02/18/2020   Procedure: ESOPHAGOGASTRODUODENOSCOPY (EGD) WITH PROPOFOL;  Surgeon: [name], MD;  Location: [facility] ENDOSCOPY;  Service: Endoscopy;  Laterality: N/A;  . ESOPHAGOGASTRODUODENOSCOPY (EGD) WITH PROPOFOL N/A 04/25/2020   Procedure: ESOPHAGOGASTRODUODENOSCOPY (EGD) WITH PROPOFOL;  Surgeon: [name], MD;  Location: [facility] ENDOSCOPY;  Service: Endoscopy;  Laterality: N/A;  . FINGER ARTHROSCOPY WITH CARPOMETACARPEL ([place]) ARTHROPLASTY Right 09/03/2018   Procedure: CARPOMETACARPEL [facility]) ARTHROPLASTY RIGHT THUMB;  Surgeon: Hessie Knows, MD;  Location: [facility] ORS;  Service: Orthopedics;  Laterality: Right;  . GANGLION CYST EXCISION Right 09/03/2018   Procedure: REMOVAL GANGLION OF WRIST;  Surgeon: Hessie Knows, MD;  Location: [facility] ORS;  Service: Orthopedics;  Laterality: Right;  . HARDWARE REMOVAL Right 09/03/2018   Procedure: HARDWARE REMOVAL RIGHT THUMB;  Surgeon: Hessie Knows, MD;  Location: [facility] ORS;  Service: Orthopedics;  Laterality: Right;  staple removed  . JOINT REPLACEMENT Left    TKR  . left sinusplasty     . MASTECTOMY, PARTIAL Left 10/17/2015   Procedure: MASTECTOMY PARTIAL REVISION;  Surgeon: Leonie Green, MD;  Location: [facility] ORS;  Service: General;  Laterality: Left;  . PARTIAL MASTECTOMY WITH NEEDLE LOCALIZATION Left 09/29/2015   Procedure: PARTIAL MASTECTOMY WITH NEEDLE LOCALIZATION;  Surgeon: Leonie Green, MD;  Location: [facility] ORS;  Service: General;  Laterality: Left;  . TOTAL ABDOMINAL  HYSTERECTOMY W/ BILATERAL SALPINGOOPHORECTOMY      FAMILY HISTORY :   Family History  Problem Relation Age of Onset  . Breast cancer Paternal Grandmother   . Colon cancer Father   . Diabetes Sister   . Diabetes Brother   . Heart disease Brother   . Prostate cancer Brother   . Colon cancer Maternal Uncle   . Prostate cancer Brother   . Bladder Cancer Brother   . Leukemia Mother        all  . Ovarian cancer Neg Hx   . Kidney cancer Neg Hx     SOCIAL HISTORY:   Social History   Tobacco Use  . Smoking status: Never Smoker  . Smokeless tobacco: Never Used  Vaping Use  . Vaping Use: Never used  Substance Use Topics  . Alcohol use: No    Alcohol/week: 0.0 standard drinks  . Drug use: No    ALLERGIES:  is allergic to codeine, demeclocycline, demerol [meperidine], hydrocodone, oxycodone, pentazocine, tetracyclines & related, and fentanyl.  MEDICATIONS:  Current Outpatient Medications  Medication Sig Dispense Refill  . azelastine (OPTIVAR) 0.05 % ophthalmic solution 1 drop into affected eye    . Cholecalciferol (VITAMIN D) 50 MCG (2000 UT) CAPS Take 2,000 Units by mouth daily.    . clobetasol ointment (TEMOVATE) 8.29 % Apply 1 application topically 2 (two) times a week. 45 g 1  .  cyanocobalamin (,VITAMIN B-12,) 1000 MCG/ML injection 1,000 mcg every 30 (thirty) days.    Marland Kitchen desloratadine (CLARINEX) 5 MG tablet Take 1 tablet by mouth daily.    Marland Kitchen dicyclomine (BENTYL) 10 MG capsule Take 10 mg by mouth at bedtime.     . docusate sodium (COLACE) 100 MG capsule Take 1 capsule by mouth 2 (two) times daily as needed.    Marland Kitchen escitalopram (LEXAPRO) 5 MG tablet Take 5 mg by mouth at bedtime.    Marland Kitchen esomeprazole (NEXIUM) 40 MG capsule Take 40 mg by mouth 2 (two) times daily before a meal.     . estradiol (ESTRACE) 0.1 MG/GM vaginal cream Place 1 Applicatorful vaginally as needed.    . fenofibrate 160 MG tablet Take 160 mg by mouth at bedtime.    . fexofenadine (ALLEGRA) 180 MG tablet Take 180  mg by mouth daily.    . fluticasone (FLONASE) 50 MCG/ACT nasal spray Place 1 spray into the nose daily as needed for allergies.    Marland Kitchen gabapentin (NEURONTIN) 300 MG capsule Take 300 mg by mouth at bedtime.     Marland Kitchen glimepiride (AMARYL) 2 MG tablet Take 2 mg by mouth at bedtime.    . hydrOXYzine (ATARAX/VISTARIL) 25 MG tablet 1 tablet as needed    . Hyoscyamine Sulfate 0.375 MG TBCR Take 0.375 mg by mouth 2 (two) times daily.     . Insulin Degludec (TRESIBA) 100 UNIT/ML SOLN Inject 54 Units into the skin at bedtime.    Marland Kitchen levothyroxine (SYNTHROID, LEVOTHROID) 75 MCG tablet Take 75 mcg by mouth daily before breakfast.     . magnesium oxide (MAG-OX) 400 MG tablet Take 400 mg by mouth 2 (two) times daily.     . nebivolol (BYSTOLIC) 2.5 MG tablet Take 1 tablet (2.5 mg total) by mouth daily. 90 tablet 3  . NOVOLOG FLEXPEN 100 UNIT/ML FlexPen Inject 15 Units into the skin 3 (three) times daily with meals. Based on sliding scale    . ondansetron (ZOFRAN) 4 MG tablet Take 1 tablet (4 mg total) by mouth every 8 (eight) hours as needed for nausea or vomiting. 20 tablet 3  . potassium chloride SA (K-DUR,KLOR-CON) 20 MEQ tablet Take 20 mEq by mouth 2 (two) times daily.     Marland Kitchen saccharomyces boulardii (FLORASTOR) 250 MG capsule Take 250 mg by mouth 2 (two) times daily.     . Semaglutide,0.25 or 0.5MG/DOS, 2 MG/1.5ML SOPN Inject 5 mg into the skin once a week. On Sunday.    . simvastatin (ZOCOR) 20 MG tablet Take 20 mg by mouth at bedtime.    Marland Kitchen spironolactone (ALDACTONE) 25 MG tablet Take 1 tablet (25 mg total) by mouth daily. 30 tablet 3  . torsemide (DEMADEX) 20 MG tablet Take 1 tablet (20 mg total) by mouth daily. 90 tablet 0  . valACYclovir (VALTREX) 500 MG tablet Take 500 mg by mouth See admin instructions. For fever blisters take 563m twice a day as needed  1  . benzonatate (TESSALON) 200 MG capsule Take 200 mg by mouth 3 (three) times daily as needed for cough. (Patient not taking: Reported on 05/16/2020)    .  EPINEPHrine 0.3 mg/0.3 mL IJ SOAJ injection Inject 0.3 mg into the muscle as needed. (Patient not taking: Reported on 05/16/2020)     No current facility-administered medications for this visit.    PHYSICAL EXAMINATION: ECOG PERFORMANCE STATUS: 1 - Symptomatic but completely ambulatory  BP 99/72   Pulse (!) 124   Temp (!) 96.5  F (35.8 C) (Tympanic)   Resp 18   Ht 5' 2"  (1.575 m)   Wt 163 lb 8 oz (74.2 kg)   LMP  (LMP Unknown)   BMI 29.90 kg/m   Filed Weights   05/16/20 1322  Weight: 163 lb 8 oz (74.2 kg)    Physical Exam Constitutional:      Comments: Alone.  Ambulating independently.  HENT:     Head: Normocephalic and atraumatic.     Mouth/Throat:     Pharynx: No oropharyngeal exudate.  Eyes:     Pupils: Pupils are equal, round, and reactive to light.  Cardiovascular:     Rate and Rhythm: Tachycardia present. Rhythm irregular.  Pulmonary:     Effort: Pulmonary effort is normal. No respiratory distress.     Breath sounds: Normal breath sounds. No wheezing.  Abdominal:     General: Bowel sounds are normal. There is no distension.     Palpations: Abdomen is soft. There is no mass.     Tenderness: There is no abdominal tenderness. There is no guarding or rebound.     Comments: Positive for splenomegaly.  Musculoskeletal:        General: No tenderness. Normal range of motion.     Cervical back: Normal range of motion and neck supple.  Skin:    General: Skin is warm.  Neurological:     Mental Status: She is alert and oriented to person, place, and time.  Psychiatric:        Mood and Affect: Affect normal.      LABORATORY DATA:  I have reviewed the data as listed    Component Value Date/Time   NA 133 (L) 05/16/2020 1310   NA 140 02/25/2020 1105   K 4.4 05/16/2020 1310   CL 96 (L) 05/16/2020 1310   CO2 28 05/16/2020 1310   GLUCOSE 393 (H) 05/16/2020 1310   BUN 16 05/16/2020 1310   BUN 10 02/25/2020 1105   CREATININE 0.86 05/16/2020 1310   CALCIUM 8.8 (L)  05/16/2020 1310   PROT 8.5 (H) 04/07/2020 1454   ALBUMIN 4.0 04/07/2020 1454   AST 45 (H) 04/07/2020 1454   ALT 21 04/07/2020 1454   ALKPHOS 46 04/07/2020 1454   BILITOT 1.0 04/07/2020 1454   GFRNONAA >60 05/16/2020 1310   GFRAA 80 02/25/2020 1105    No results found for: SPEP, UPEP  Lab Results  Component Value Date   WBC 2.0 (L) 05/16/2020   NEUTROABS 0.9 (L) 05/16/2020   HGB 12.0 05/16/2020   HCT 36.2 05/16/2020   MCV 92.1 05/16/2020   PLT 70 (L) 05/16/2020      Chemistry      Component Value Date/Time   NA 133 (L) 05/16/2020 1310   NA 140 02/25/2020 1105   K 4.4 05/16/2020 1310   CL 96 (L) 05/16/2020 1310   CO2 28 05/16/2020 1310   BUN 16 05/16/2020 1310   BUN 10 02/25/2020 1105   CREATININE 0.86 05/16/2020 1310      Component Value Date/Time   CALCIUM 8.8 (L) 05/16/2020 1310   ALKPHOS 46 04/07/2020 1454   AST 45 (H) 04/07/2020 1454   ALT 21 04/07/2020 1454   BILITOT 1.0 04/07/2020 1454       RADIOGRAPHIC STUDIES: I have personally reviewed the radiological images as listed and agreed with the findings in the report. No results found.   ASSESSMENT & PLAN:  Other pancytopenia ([place]) # Anemia-secondary GI bleed chronic.Marland Kitchen  Hemoglobin-12.0.  Hold off  IV iron infusion.  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  ANC-900; Platelets 70s. STABLE.   # Tachy-brady syndrome- Hx of A.fib [awaiting evaluation with Dr.End]-defer to cardiology regarding management.  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 Mammogram December 2021-within normal limits.  Stable.  # DISPOSITION:  # NO venofer today # follow up in 4 month- MD; labs- cbc/cmp; AFP;iron studies/ferritin; possible Venofer-Dr.B     Orders Placed This Encounter  Procedures  . CBC with Differential/Platelet     Standing Status:   Future    Number of Occurrences:   1    Standing Expiration Date:   05/16/2021  . Basic metabolic panel    Standing Status:   Future    Number of Occurrences:   1    Standing Expiration Date:   05/16/2021  . CBC with Differential    Standing Status:   Future    Standing Expiration Date:   05/16/2021  . Comprehensive metabolic panel    Standing Status:   Future    Standing Expiration Date:   05/16/2021  . AFP tumor marker    Standing Status:   Future    Standing Expiration Date:   05/16/2021  . Ferritin    Standing Status:   Future    Standing Expiration Date:   05/16/2021  . Iron and TIBC    Standing Status:   Future    Standing Expiration Date:   05/16/2021   All questions were answered. The patient knows to call the clinic with any problems, questions or concerns.      Cammie Sickle, MD 05/16/2020 2:15 PM

## 2020-05-17 ENCOUNTER — Ambulatory Visit (INDEPENDENT_AMBULATORY_CARE_PROVIDER_SITE_OTHER): Payer: Medicare PPO | Admitting: Family

## 2020-05-17 ENCOUNTER — Encounter: Payer: Self-pay | Admitting: Family

## 2020-05-17 VITALS — BP 96/60 | HR 125 | Ht 62.0 in | Wt 164.0 lb

## 2020-05-17 DIAGNOSIS — I4891 Unspecified atrial fibrillation: Secondary | ICD-10-CM

## 2020-05-17 DIAGNOSIS — I4892 Unspecified atrial flutter: Secondary | ICD-10-CM

## 2020-05-17 DIAGNOSIS — K7460 Unspecified cirrhosis of liver: Secondary | ICD-10-CM

## 2020-05-17 DIAGNOSIS — R188 Other ascites: Secondary | ICD-10-CM

## 2020-05-17 DIAGNOSIS — I5032 Chronic diastolic (congestive) heart failure: Secondary | ICD-10-CM | POA: Diagnosis not present

## 2020-05-17 DIAGNOSIS — I8500 Esophageal varices without bleeding: Secondary | ICD-10-CM

## 2020-05-17 DIAGNOSIS — R9431 Abnormal electrocardiogram [ECG] [EKG]: Secondary | ICD-10-CM

## 2020-05-17 NOTE — Patient Instructions (Signed)
Medication Instructions:  No medication changes today.   *If you need a refill on your cardiac medications before your next appointment, please call your pharmacy*  Lab Work: None ordered today.   Testing/Procedures: Your EKG today shows atrial flutter.   Follow-Up: At [facility] you and your health needs are our priority.  As part of our continuing mission to provide you with exceptional heart care, we have created designated Provider Care Teams.  These Care Teams include your primary Cardiologist (physician) and Advanced Practice Providers (APPs -  Physician Assistants and Nurse Practitioners) who all work together to provide you with the care you need, when you need it.  We recommend signing up for the patient portal called "MyChart".  Sign up information is provided on this After Visit Summary.  MyChart is used to connect with patients for Virtual Visits (Telemedicine).  Patients are able to view lab/test results, encounter notes, upcoming appointments, etc.  Non-urgent messages can be sent to your provider as well.   To learn more about what you can do with MyChart, go to [email or web address].    Your next appointment:   As scheduled with Dr. Quentin Ore AND In 2-3 months with Dr. Saunders Revel or APP  Other Instructions  Heart Healthy Diet Recommendations: A low-salt diet is recommended. Meats should be grilled, baked, or boiled. Avoid fried foods. Focus on lean protein sources like fish or chicken with vegetables and fruits. The American Heart Association is a Microbiologist!  American Heart Association Diet and Lifeystyle Recommendations   Exercise recommendations: The American Heart Association recommends 150 minutes of moderate intensity exercise weekly. Try 30 minutes of moderate intensity exercise 4-5 times per week. This could include walking, jogging, or swimming.

## 2020-05-17 NOTE — Progress Notes (Signed)
Office Visit    Patient Name: Annette Foster Date of Encounter: 05/17/2020  PCP:  Albina Billet, MD   [place]  Cardiologist:  Nelva Bush, MD  Advanced Practice Provider:  No care team member to display Electrophysiologist:  Vickie Epley, MD   Chief Complaint    Annette Foster is a 80 y.o. female with a hx of remote bowel surgery in the 1970s, HFpEF, PAF complicated by GI bleed on anticoagulation, hypertension, hyperlipidemia, Annette Foster, seizure disorder, OSA, left breast cancer s/p mastectomy presents today for follow-up of atrial fibrillation  Past Medical History    Past Medical History:  Diagnosis Date  . Abdominal pain   . Allergy   . Cancer [facility]) 2017   breast cancer- Left  . Cataract   . CHF (congestive heart failure) ([place])   . Collagenous colitis   . Diabetes mellitus without complication ([place])   . Diarrhea   . Diverticulosis   . Fatty liver disease, nonalcoholic   . Fibrocystic breast   . GERD (gastroesophageal reflux disease)   . Heart murmur   . Hyperlipidemia   . Hypertension   . Hypothyroidism   . IBS (irritable bowel syndrome)   . IDA (iron deficiency anemia)   . Personal history of radiation therapy 2017   LEFT BREAST CA  . PONV (postoperative nausea and vomiting)   . Sleep apnea    C-Pap   Past Surgical History:  Procedure Laterality Date  . ABDOMINAL HYSTERECTOMY     [place] bso  . ABDOMINAL SURGERY    . APPENDECTOMY    . BREAST BIOPSY Bilateral 2016   negative  . 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 BREAST BIOPSY Left 05/27/2016   BENIGN MAMMARY EPITHELIUM  . BREAST BIOPSY Left 11/20/2017   affirm bx x clip BENIGN MAMMARY EPITHELIUM CONSISTENT WITH RAD THERAPY  . BREAST LUMPECTOMY Left 10/17/2015   DCIS and papillary carcinoma insitu, clear margins  . CARDIAC SURGERY     has replacement valve  . CATARACT EXTRACTION Right   . CHOLECYSTECTOMY    . COLONOSCOPY WITH  PROPOFOL N/A 03/11/2016   Procedure: COLONOSCOPY WITH PROPOFOL;  Surgeon: Manya Silvas, MD;  Location[facility] ENDOSCOPY;  Service: Endoscopy;  Laterality: N/A;  . COLONOSCOPY WITH PROPOFOL N/A 02/18/2020   Procedure: COLONOSCOPY WITH PROPOFOL;  Surgeon: [name], MD;  Location: [facility] ENDOSCOPY;  Service: Endoscopy;  Laterality: N/A;  . ESOPHAGOGASTRODUODENOSCOPY (EGD) WITH PROPOFOL N/A 03/11/2016   Procedure: ESOPHAGOGASTRODUODENOSCOPY (EGD) WITH PROPOFOL;  Surgeon: Manya Silvas, MD;  Location: [facility] ENDOSCOPY;  Service: Endoscopy;  Laterality: N/A;  . ESOPHAGOGASTRODUODENOSCOPY (EGD) WITH PROPOFOL N/A 02/18/2020   Procedure: ESOPHAGOGASTRODUODENOSCOPY (EGD) WITH PROPOFOL;  Surgeon: [name], MD;  Location: [facility] ENDOSCOPY;  Service: Endoscopy;  Laterality: N/A;  . ESOPHAGOGASTRODUODENOSCOPY (EGD) WITH PROPOFOL N/A 04/25/2020   Procedure: ESOPHAGOGASTRODUODENOSCOPY (EGD) WITH PROPOFOL;  Surgeon: [name], MD;  Location: [facility] ENDOSCOPY;  Service: Endoscopy;  Laterality: N/A;  . FINGER ARTHROSCOPY WITH CARPOMETACARPEL ([place]) ARTHROPLASTY Right 09/03/2018   Procedure: CARPOMETACARPEL [facility]) ARTHROPLASTY RIGHT THUMB;  Surgeon: Hessie Knows, MD;  Location: [facility] ORS;  Service: Orthopedics;  Laterality: Right;  . GANGLION CYST EXCISION Right 09/03/2018   Procedure: REMOVAL GANGLION OF WRIST;  Surgeon: Hessie Knows, MD;  Location: [facility] ORS;  Service: Orthopedics;  Laterality: Right;  . HARDWARE REMOVAL Right 09/03/2018   Procedure: HARDWARE REMOVAL RIGHT THUMB;  Surgeon: Hessie Knows, MD;  Location: [facility] ORS;  Service: Orthopedics;  Laterality: Right;  staple removed  . JOINT REPLACEMENT Left    TKR  . left sinusplasty     . MASTECTOMY, PARTIAL Left 10/17/2015   Procedure: MASTECTOMY PARTIAL REVISION;  Surgeon: Leonie Green, MD;  Location: [facility] ORS;  Service: General;  Laterality: Left;  . PARTIAL MASTECTOMY WITH NEEDLE LOCALIZATION Left 09/29/2015   Procedure: PARTIAL MASTECTOMY WITH NEEDLE  LOCALIZATION;  Surgeon: Leonie Green, MD;  Location: [facility] ORS;  Service: General;  Laterality: Left;  . TOTAL ABDOMINAL HYSTERECTOMY W/ BILATERAL SALPINGOOPHORECTOMY      Allergies  Allergies  Allergen Reactions  . Codeine Itching and Nausea And Vomiting  . Demeclocycline Rash  . Demerol [Meperidine] Itching and Nausea And Vomiting  . Hydrocodone Itching and Nausea And Vomiting  . Oxycodone Itching and Nausea And Vomiting  . Pentazocine Itching and Nausea And Vomiting  . Tetracyclines & Related Rash  . Fentanyl Itching and Nausea And Vomiting    D/W surgical nurse on 7/23 and pt reportedly tolerated dose during surgery.  Dose was given by MD.  Pt reported that she only gets itching and did not object to receiving med    History of Present Illness    Annette Foster is a 80 y.o. female with a hx of remote bowel surgery in the 1970s, HFpEF, PAF complicated by GI bleed on anticoagulation, hypertension, hyperlipidemia, Annette Foster, seizure disorder, OSA, left breast cancer s/p mastectomy.  She was last seen by Dr. Quentin Ore 04/26/2020.Marland Kitchen  When seen by Dr. Saunders Revel 04/05/2020 she noted fatigue.  Her heart rate was 50 bpm while on minimal dose carvedilol.  It was recommended to be discontinued.  She was referred to Dr. Quentin Ore for management of atrial fibrillation.  She was noted to have anterolateral ST/T wave changes which dated back to at least 2017 which are suspected due to abnormal repolarization though ischemic heart disease could not be excluded.  As she was without anginal symptoms ischemic evaluation was deferred.  04/23/2020 she had a EGD with esophageal varices banding.  Plan for repeat banding procedure in about 4 weeks.  She saw Dr. Quentin Ore 04/26/2020 and was in atrial fibrillation at the time.  She was recommended to start Nebivolol 2.5 mg daily.  Rhythm control was unable to be pursued as she is off anticoagulation.  Watchman discussion was initiated.  Plan was for her to complete  treatment of esophageal varices prior to pursuing watchman that she will require 3 weeks of anticoagulation prior to watchman implant in 45 days after.  She presents today for follow-up. She notes palpitations and variable heart rates. Tells me it will go from the 40s to the 120s.  Symptomatic with palpitations with elevated heart rates.  Denies lightheadedness, dizziness, near syncope, CP.  She monitors on her smart watch which tells her heart rate but does not have EKG capability. Does not feel her strength is back from her hospitalizations in January and February.  Notes stable dyspnea on exertion and feels like her "get up and go" just isn't quite there.  Denies chest pain, pressure, tightness.  No edema, orthopnea, PND  EKGs/Labs/Other Studies Reviewed:   The following studies were reviewed today: Echo 01/2020  1. Left ventricular ejection fraction, by estimation, is 50 to 55%. The  left ventricle has low normal function. Left ventricular endocardial  border not optimally defined to evaluate regional wall motion. There is  mild left ventricular hypertrophy. Left  ventricular diastolic parameters are indeterminate.   2. Right ventricular  systolic function is mildly reduced. The right  ventricular size is normal. Mildly increased right ventricular wall  thickness. There is moderately elevated pulmonary artery systolic  pressure.   3. Left atrial size was moderately dilated.   4. Right atrial size was mildly dilated.   5. The pericardial effusion is posterior to the left ventricle.   6. The mitral valve is abnormal. Trivial mitral valve regurgitation.   7. Tricuspid valve regurgitation is mild to moderate.   8. The aortic valve is tricuspid. There is mild calcification of the  aortic valve. There is mild thickening of the aortic valve. Aortic valve  regurgitation is not visualized. Mild to moderate aortic valve  sclerosis/calcification is present, without any  evidence of aortic stenosis.    9. There is mild dilatation of the ascending aorta, measuring 36 mm.  10. The inferior vena cava is dilated in size with <50% respiratory  variability, suggesting right atrial pressure of 15 mmHg.   Monitor 03/2020  The patient was monitored for 14 days.  The predominant rhythm was sinus with an average rate of 55 bpm (range 42-96 bpm in sinus).  There were occasional PAC's and rare PVC's.  16 atrial runs lasting up to 12 beats occurred with a maximum rate of 164 bpm.  There was a single episode of nonsustained ventricular tachycardia lasting 6 beats with a maximum rate of 152 bpm.  No sustained arrhythmia or prolonged pause was observed.  Patient triggered events correspond to sinus rhythm, PAC's, and PVC's.   Predominantly sinus rhythm with occasional PAC's and rare PVC's, as well as rare brief episodes of PSVT and NSVT.  EKG:  EKG is  ordered today.  The ekg ordered today demonstrates atrial flutter 125 bpm with 2-1 AV conduction and T wave inversion noted in inferolateral leads likely due to tachycardia  Recent Labs: 02/07/2020: Magnesium 2.3; TSH 3.169 02/16/2020: B Natriuretic Peptide 377.8 04/07/2020: ALT 21 05/16/2020: BUN 16; Creatinine, Ser 0.86; Hemoglobin 12.0; Platelets 70; Potassium 4.4; Sodium 133  Recent Lipid Panel No results found for: CHOL, TRIG, HDL, CHOLHDL, VLDL, LDLCALC, LDLDIRECT   Home Medications   Current Meds  Medication Sig  . azelastine (OPTIVAR) 0.05 % ophthalmic solution 1 drop into affected eye  . benzonatate (TESSALON) 200 MG capsule Take 200 mg by mouth 3 (three) times daily as needed for cough.  . Cholecalciferol (VITAMIN D) 50 MCG (2000 UT) CAPS Take 2,000 Units by mouth daily.  . clobetasol ointment (TEMOVATE) 6.22 % Apply 1 application topically 2 (two) times a week.  . cyanocobalamin (,VITAMIN B-12,) 1000 MCG/ML injection 1,000 mcg every 30 (thirty) days.  Marland Kitchen desloratadine (CLARINEX) 5 MG tablet Take 1 tablet by mouth daily.  Marland Kitchen  dicyclomine (BENTYL) 10 MG capsule Take 10 mg by mouth at bedtime.   . docusate sodium (COLACE) 100 MG capsule Take 1 capsule by mouth 2 (two) times daily as needed.  Marland Kitchen EPINEPHrine 0.3 mg/0.3 mL IJ SOAJ injection Inject 0.3 mg into the muscle as needed.  Marland Kitchen escitalopram (LEXAPRO) 5 MG tablet Take 5 mg by mouth at bedtime.  Marland Kitchen esomeprazole (NEXIUM) 40 MG capsule Take 40 mg by mouth 2 (two) times daily before a meal.   . estradiol (ESTRACE) 0.1 MG/GM vaginal cream Place 1 Applicatorful vaginally as needed.  . fenofibrate 160 MG tablet Take 160 mg by mouth at bedtime.  . fexofenadine (ALLEGRA) 180 MG tablet Take 180 mg by mouth daily.  . fluticasone (FLONASE) 50 MCG/ACT nasal spray Place 1 spray  into the nose daily as needed for allergies.  Marland Kitchen gabapentin (NEURONTIN) 300 MG capsule Take 300 mg by mouth at bedtime.   Marland Kitchen glimepiride (AMARYL) 2 MG tablet Take 2 mg by mouth at bedtime.  . hydrOXYzine (ATARAX/VISTARIL) 25 MG tablet 1 tablet as needed  . Hyoscyamine Sulfate 0.375 MG TBCR Take 0.375 mg by mouth 2 (two) times daily.   . Insulin Degludec (TRESIBA) 100 UNIT/ML SOLN Inject 54 Units into the skin at bedtime.  Marland Kitchen levothyroxine (SYNTHROID, LEVOTHROID) 75 MCG tablet Take 75 mcg by mouth daily before breakfast.   . magnesium oxide (MAG-OX) 400 MG tablet Take 400 mg by mouth 2 (two) times daily.   . nebivolol (BYSTOLIC) 2.5 MG tablet Take 1 tablet (2.5 mg total) by mouth daily.  Marland Kitchen NOVOLOG FLEXPEN 100 UNIT/ML FlexPen Inject 15 Units into the skin 3 (three) times daily with meals. Based on sliding scale  . ondansetron (ZOFRAN) 4 MG tablet Take 1 tablet (4 mg total) by mouth every 8 (eight) hours as needed for nausea or vomiting.  . potassium chloride SA (K-DUR,KLOR-CON) 20 MEQ tablet Take 20 mEq by mouth 2 (two) times daily.   Marland Kitchen saccharomyces boulardii (FLORASTOR) 250 MG capsule Take 250 mg by mouth 2 (two) times daily.   . Semaglutide,0.25 or 0.5MG/DOS, 2 MG/1.5ML SOPN Inject 5 mg into the skin once a  week. On Sunday.  . simvastatin (ZOCOR) 20 MG tablet Take 20 mg by mouth at bedtime.  Marland Kitchen spironolactone (ALDACTONE) 25 MG tablet Take 1 tablet (25 mg total) by mouth daily.  Marland Kitchen torsemide (DEMADEX) 20 MG tablet Take 1 tablet (20 mg total) by mouth daily.  . valACYclovir (VALTREX) 500 MG tablet Take 500 mg by mouth See admin instructions. For fever blisters take 554m twice a day as needed     Review of Systems  All other systems reviewed and are otherwise negative except as noted above.  Physical Exam    VS:  BP 96/60   Pulse (!) 125   Ht 5' 2"  (1.575 m)   Wt 164 lb (74.4 kg)   LMP  (LMP Unknown)   BMI 30.00 kg/m  , BMI Body mass index is 30 kg/m.  Wt Readings from Last 3 Encounters:  05/17/20 164 lb (74.4 kg)  05/16/20 163 lb 8 oz (74.2 kg)  04/26/20 163 lb 6.4 oz (74.1 kg)    GEN: Well nourished, well developed, in no acute distress. HEENT: normal. Neck: Supple, no JVD, carotid bruits, or masses. Cardiac: irregular, tachycardic, no murmurs, rubs, or gallops. No clubbing, cyanosis, edema.  Radials/DP/PT 2+ and equal bilaterally.  Respiratory:  Respirations regular and unlabored, clear to auscultation bilaterally. GI: Soft, nontender, nondistended. MS: No deformity or atrophy. Skin: Warm and dry, no rash. Neuro:  Strength and sensation are intact. Psych: Normal affect.  Assessment & Plan    1. Atrial fibrillation/Atrial flutter - EKG today with atrial flutter 125bpm with 2:1 AV block and TWI in inferolateral leads likely due to tachycardia. She reports variable HR at home on smart watch 40bpm-128bpm.  Symptomatic of elevated heart rate with palpitations.  Unable to restore normal sinus rhythm at this time as she is not anticoagulated due to GI work-up.  She has a CHA2DS2-VASc of at least 4  (agex2, gender, HF). We will continue Bystolic 2.5 mg daily.  Discussed with Dr. LQuentin Orein clinic, will not increase dose due to bradycardia at home.  Plan for follow-up with Dr. LQuentin Orenext  month for continued discussion of  Watchman. We did discuss the device today and view pictures, she appreciated the explanation.  2. Chronic HFrEF- Euvolemic and well compensated on exam.  Endorses fatigue and dyspnea on exertion with NYHA II symptoms.  GDMT includes Bystolic, spironolactone, torsemide.  Further escalation limited by relative hypotension.  Low-salt diet and fluid restriction encouraged.  3. Cirrhosis / GI bleed / Esophageal varices- Upcoming EGD 05/23/20. Report no recurrent bleeding symptoms. Treatment of esophageal varices will need to be completed prior to Watchman.  Per AHA/ACC guidelines she is deemed acceptable risk for the planned procedure without additional cardiovascular testing.   4. Abnormal EKG-EKG today atrial flutter 125 bpm with 2 1 AV conduction and T wave inversion noted in inferolateral leads.  Likely due to demand ischemia.  As she is without anginal symptoms and likely would not be able to use aspirin or antiplatelet agent in the setting of anemia and current work-up with GI we will defer ischemic evaluation at this time.  Disposition: Follow up in May with Dr. Quentin Ore as scheduled and in 2 to 3 months with Dr. Saunders Revel.  Signed, Loel Dubonnet, NP 05/17/2020, 4:33 PM [facility]

## 2020-05-19 ENCOUNTER — Other Ambulatory Visit: Admission: RE | Admit: 2020-05-19 | Payer: Medicare PPO | Source: Ambulatory Visit

## 2020-05-23 ENCOUNTER — Ambulatory Visit: Payer: Medicare PPO | Admitting: Certified Registered"

## 2020-05-23 ENCOUNTER — Ambulatory Visit
Admission: RE | Admit: 2020-05-23 | Discharge: 2020-05-23 | Disposition: A | Discharge status: Home / Self Care | Payer: Medicare PPO | Attending: Gastroenterology | Admitting: Gastroenterology

## 2020-05-23 ENCOUNTER — Encounter: Payer: Self-pay | Admitting: Gastroenterology

## 2020-05-23 ENCOUNTER — Encounter
Admission: RE | Disposition: A | Discharge status: Home / Self Care | Payer: Self-pay | Source: Home / Self Care | Attending: Gastroenterology

## 2020-05-23 ENCOUNTER — Other Ambulatory Visit: Payer: Self-pay | Admitting: Gastroenterology

## 2020-05-23 ENCOUNTER — Other Ambulatory Visit: Payer: Self-pay

## 2020-05-23 DIAGNOSIS — Z888 Allergy status to other drugs, medicaments and biological substances status: Secondary | ICD-10-CM | POA: Diagnosis not present

## 2020-05-23 DIAGNOSIS — Z881 Allergy status to other antibiotic agents status: Secondary | ICD-10-CM | POA: Insufficient documentation

## 2020-05-23 DIAGNOSIS — Z79890 Hormone replacement therapy (postmenopausal): Secondary | ICD-10-CM | POA: Diagnosis not present

## 2020-05-23 DIAGNOSIS — Z885 Allergy status to narcotic agent status: Secondary | ICD-10-CM | POA: Insufficient documentation

## 2020-05-23 DIAGNOSIS — Z794 Long term (current) use of insulin: Secondary | ICD-10-CM | POA: Diagnosis not present

## 2020-05-23 DIAGNOSIS — Z79899 Other long term (current) drug therapy: Secondary | ICD-10-CM | POA: Insufficient documentation

## 2020-05-23 DIAGNOSIS — K766 Portal hypertension: Secondary | ICD-10-CM | POA: Diagnosis not present

## 2020-05-23 DIAGNOSIS — Z7984 Long term (current) use of oral hypoglycemic drugs: Secondary | ICD-10-CM | POA: Insufficient documentation

## 2020-05-23 DIAGNOSIS — I8500 Esophageal varices without bleeding: Secondary | ICD-10-CM | POA: Diagnosis present

## 2020-05-23 DIAGNOSIS — K3189 Other diseases of stomach and duodenum: Secondary | ICD-10-CM | POA: Insufficient documentation

## 2020-05-23 HISTORY — PX: ESOPHAGOGASTRODUODENOSCOPY (EGD) WITH PROPOFOL: SHX5813

## 2020-05-23 LAB — GLUCOSE, CAPILLARY: Glucose-Capillary: 258 mg/dL — ABNORMAL HIGH (ref 70–99)

## 2020-05-23 SURGERY — ESOPHAGOGASTRODUODENOSCOPY (EGD) WITH PROPOFOL
Anesthesia: General

## 2020-05-23 MED ORDER — SPIRONOLACTONE 25 MG PO TABS: 25.0000 mg | ORAL_TABLET | Freq: Every day | ORAL | 8 refills | Status: DC

## 2020-05-23 MED ORDER — PROPOFOL 10 MG/ML IV BOLUS
INTRAVENOUS | Status: DC | PRN
  Administered 2020-05-23: 70 mg via INTRAVENOUS

## 2020-05-23 MED ORDER — GLYCOPYRROLATE 0.2 MG/ML IJ SOLN
INTRAMUSCULAR | Status: DC | PRN
  Administered 2020-05-23: .2 mg via INTRAVENOUS

## 2020-05-23 MED ORDER — PROPOFOL 500 MG/50ML IV EMUL
INTRAVENOUS | Status: DC | PRN
  Administered 2020-05-23: 150 ug/kg/min via INTRAVENOUS

## 2020-05-23 MED ORDER — SODIUM CHLORIDE 0.9 % IV SOLN: INTRAVENOUS | Status: DC

## 2020-05-23 MED ORDER — LIDOCAINE HCL (CARDIAC) PF 100 MG/5ML IV SOSY
PREFILLED_SYRINGE | INTRAVENOUS | Status: DC | PRN
  Administered 2020-05-23: 50 mg via INTRAVENOUS

## 2020-05-23 MED ORDER — PROPOFOL 500 MG/50ML IV EMUL
INTRAVENOUS | Status: AC
  Filled 2020-05-23: qty 50

## 2020-05-23 NOTE — Anesthesia Preprocedure Evaluation (Signed)
Anesthesia Evaluation  Patient identified by MRN, date of birth, ID band Patient awake    Reviewed: Allergy & Precautions, H&P , NPO status , Patient's Chart, lab work & pertinent test results  History of Anesthesia Complications (+) PONV and history of anesthetic complications  Airway Mallampati: III  TM Distance: <3 FB Neck ROM: limited    Dental  (+) Missing   Pulmonary sleep apnea ,    Pulmonary exam normal        Cardiovascular Exercise Tolerance: Good hypertension, (-) angina+CHF  Normal cardiovascular exam+ Valvular Problems/Murmurs      Neuro/Psych negative neurological ROS  negative psych ROS   GI/Hepatic GERD  Medicated and Controlled,(+) Hepatitis -  Endo/Other  diabetes, Type 2, Insulin DependentHypothyroidism   Renal/GU Renal disease  negative genitourinary   Musculoskeletal   Abdominal   Peds  Hematology negative hematology ROS (+)   Anesthesia Other Findings Patient is NPO appropriate and reports no nausea or vomiting today.  Past Medical History: No date: Abdominal pain No date: Allergy 2017: Cancer ([place])     Comment:  breast cancer- Left No date: Cataract No date: CHF (congestive heart failure) ([facility]) No date: Collagenous colitis No date: Diabetes mellitus without complication ([facility]) No date: Diarrhea No date: Diverticulosis No date: Fatty liver disease, nonalcoholic No date: Fibrocystic breast No date: GERD (gastroesophageal reflux disease) No date: Heart murmur No date: Hyperlipidemia No date: Hypertension No date: Hypothyroidism No date: IBS (irritable bowel syndrome) No date: IDA (iron deficiency anemia) 2017: Personal history of radiation therapy     Comment:  LEFT BREAST CA No date: PONV (postoperative nausea and vomiting) No date: Sleep apnea     Comment:  C-Pap  Past Surgical History: No date: ABDOMINAL HYSTERECTOMY     Comment:  [place] bso No date: ABDOMINAL SURGERY No  date: APPENDECTOMY 2016: BREAST BIOPSY; Bilateral     Comment:  negative 09/11/2015: BREAST BIOPSY; Left     Comment:  DCIS, papillary carcinoma in situ 05/27/2016: BREAST BIOPSY; Left     Comment:  BENIGN MAMMARY EPITHELIUM 11/20/2017: BREAST BIOPSY; Left     Comment:  affirm bx x clip BENIGN MAMMARY EPITHELIUM CONSISTENT               WITH RAD THERAPY 10/17/2015: BREAST LUMPECTOMY; Left     Comment:  DCIS and papillary carcinoma insitu, clear margins No date: CARDIAC SURGERY     Comment:  has replacement valve No date: CATARACT EXTRACTION; Right No date: CHOLECYSTECTOMY 03/11/2016: COLONOSCOPY WITH PROPOFOL; N/A     Comment:  Procedure: COLONOSCOPY WITH PROPOFOL;  Surgeon: Manya Silvas, MD;  Location: [facility] ENDOSCOPY;  Service:               Endoscopy;  Laterality: N/A; 02/18/2020: COLONOSCOPY WITH PROPOFOL; N/A     Comment:  Procedure: COLONOSCOPY WITH PROPOFOL;  Surgeon: [name], MD;  Location: [facility] ENDOSCOPY;  Service:               Endoscopy;  Laterality: N/A; 03/11/2016: ESOPHAGOGASTRODUODENOSCOPY (EGD) WITH PROPOFOL; N/A     Comment:  Procedure: ESOPHAGOGASTRODUODENOSCOPY (EGD) WITH               PROPOFOL;  Surgeon: Manya Silvas, MD;  Location: [facility]              ENDOSCOPY;  Service: Endoscopy;  Laterality: N/A; 02/18/2020: ESOPHAGOGASTRODUODENOSCOPY (EGD) WITH PROPOFOL; N/A     Comment:  Procedure: ESOPHAGOGASTRODUODENOSCOPY (EGD) WITH               PROPOFOL;  Surgeon: [name], MD;  Location: [facility]               ENDOSCOPY;  Service: Endoscopy;  Laterality: N/A; 09/03/2018: FINGER ARTHROSCOPY WITH CARPOMETACARPEL ([place])  ARTHROPLASTY; Right     Comment:  Procedure: CARPOMETACARPEL [facility]) ARTHROPLASTY RIGHT               THUMB;  Surgeon: Hessie Knows, MD;  Location: [facility] ORS;               Service: Orthopedics;  Laterality: Right; 09/03/2018: GANGLION CYST EXCISION; Right     Comment:  Procedure: REMOVAL GANGLION OF WRIST;  Surgeon: Hessie Knows, MD;  Location: [facility] ORS;  Service: Orthopedics;               Laterality: Right; 09/03/2018: HARDWARE REMOVAL; Right     Comment:  Procedure: HARDWARE REMOVAL RIGHT THUMB;  Surgeon: Hessie Knows, MD;  Location: [facility] ORS;  Service: Orthopedics;               Laterality: Right;  staple removed No date: JOINT REPLACEMENT; Left     Comment:  TKR No date: left sinusplasty  10/17/2015: MASTECTOMY, PARTIAL; Left     Comment:  Procedure: MASTECTOMY PARTIAL REVISION;  Surgeon: Leonie Green, MD;  Location: [facility] ORS;  Service: General;              Laterality: Left; 09/29/2015: PARTIAL MASTECTOMY WITH NEEDLE LOCALIZATION; Left     Comment:  Procedure: PARTIAL MASTECTOMY WITH NEEDLE LOCALIZATION;               Surgeon: Leonie Green, MD;  Location: [facility] ORS;                Service: General;  Laterality: Left; No date: TOTAL ABDOMINAL HYSTERECTOMY W/ BILATERAL SALPINGOOPHORECTOMY  BMI    Body Mass Index: 27.98 kg/m      Reproductive/Obstetrics negative OB ROS                             Anesthesia Physical  Anesthesia Plan  ASA: III  Anesthesia Plan: General   Post-op Pain Management:    Induction: Intravenous  PONV Risk Score and Plan: Propofol infusion and TIVA  Airway Management Planned: Natural Airway and Nasal Cannula  Additional Equipment:   Intra-op Plan:   Post-operative Plan:   Informed Consent: I have reviewed the patients History and Physical, chart, labs and discussed the procedure including the risks, benefits and alternatives for the proposed anesthesia with the patient or authorized representative who has indicated his/her understanding and acceptance.     Dental Advisory Given  Plan Discussed with: Anesthesiologist, CRNA and Surgeon  Anesthesia Plan Comments: (Patient consented for risks of anesthesia including but not limited to:  - adverse reactions to medications - risk  of airway placement if required - damage to eyes, teeth, lips or other oral mucosa - nerve damage due to positioning  - sore throat or hoarseness - Damage to heart, brain, nerves, lungs, other parts  of body or loss of life  Patient voiced understanding.)        Anesthesia Quick Evaluation

## 2020-05-23 NOTE — Transfer of Care (Signed)
Immediate Anesthesia Transfer of Care Note  Patient: Annette Foster  Procedure(s) Performed: ESOPHAGOGASTRODUODENOSCOPY (EGD) WITH PROPOFOL (N/A )  Patient Location: PACU and Endoscopy Unit  Anesthesia Type:General  Level of Consciousness: drowsy  Airway & Oxygen Therapy: Patient Spontanous Breathing  Post-op Assessment: Report given to RN  Post vital signs: stable  Last Vitals:  Vitals Value Taken Time  BP 122/61 05/23/20 1022  Temp 36.2 C 05/23/20 1023  Pulse 66 05/23/20 1023  Resp 28 05/23/20 1023  SpO2 98 % 05/23/20 1023  Vitals shown include unvalidated device data.  Last Pain:  Vitals:   05/23/20 1023  TempSrc: Temporal  PainSc:          Complications: No complications documented.

## 2020-05-23 NOTE — Anesthesia Postprocedure Evaluation (Signed)
Anesthesia Post Note  Patient: Annette Foster  Procedure(s) Performed: ESOPHAGOGASTRODUODENOSCOPY (EGD) WITH PROPOFOL (N/A )  Patient location during evaluation: Endoscopy Anesthesia Type: General Level of consciousness: awake and alert Pain management: pain level controlled Vital Signs Assessment: post-procedure vital signs reviewed and stable Respiratory status: spontaneous breathing, nonlabored ventilation, respiratory function stable and patient connected to nasal cannula oxygen Cardiovascular status: blood pressure returned to baseline and stable Postop Assessment: no apparent nausea or vomiting Anesthetic complications: no   No complications documented.   Last Vitals:  Vitals:   05/23/20 0928 05/23/20 1023  BP: 109/72   Pulse: (!) 47   Resp: 16   Temp: 36.4 C (!) 36.2 C  SpO2: 100%     Last Pain:  Vitals:   05/23/20 1053  TempSrc:   PainSc: 0-No pain                 Martha Clan

## 2020-05-23 NOTE — H&P (Signed)
Annette Lame, MD [facility] [address] [place], [place] [address] Phone:[phone] Fax : [phone]  Primary Care Physician:  Albina Billet, MD Primary Gastroenterologist:  Dr. Allen Norris  Pre-Procedure History & Physical: HPI:  Annette Foster is a 80 y.o. female is here for an endoscopy.   Past Medical History:  Diagnosis Date  . Abdominal pain   . Allergy   . Cancer [facility]) 2017   breast cancer- Left  . Cataract   . CHF (congestive heart failure) ([place])   . Collagenous colitis   . Diabetes mellitus without complication ([place])   . Diarrhea   . Diverticulosis   . Fatty liver disease, nonalcoholic   . Fibrocystic breast   . GERD (gastroesophageal reflux disease)   . Heart murmur   . Hyperlipidemia   . Hypertension   . Hypothyroidism   . IBS (irritable bowel syndrome)   . IDA (iron deficiency anemia)   . Personal history of radiation therapy 2017   LEFT BREAST CA  . PONV (postoperative nausea and vomiting)   . Sleep apnea    C-Pap    Past Surgical History:  Procedure Laterality Date  . ABDOMINAL HYSTERECTOMY     [place] [facility]  . ABDOMINAL SURGERY    . APPENDECTOMY    . BREAST BIOPSY Bilateral 2016   negative  . 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 BREAST BIOPSY Left 05/27/2016   BENIGN MAMMARY EPITHELIUM  . BREAST BIOPSY Left 11/20/2017   affirm bx x clip BENIGN MAMMARY EPITHELIUM CONSISTENT WITH RAD THERAPY  . BREAST LUMPECTOMY Left 10/17/2015   DCIS and papillary carcinoma insitu, clear margins  . CARDIAC SURGERY     has replacement valve  . CATARACT EXTRACTION Right   . CHOLECYSTECTOMY    . COLONOSCOPY WITH PROPOFOL N/A 03/11/2016   Procedure: COLONOSCOPY WITH PROPOFOL;  Surgeon: Manya Silvas, MD;  Location: [facility] ENDOSCOPY;  Service: Endoscopy;  Laterality: N/A;  . COLONOSCOPY WITH PROPOFOL N/A 02/18/2020   Procedure: COLONOSCOPY WITH PROPOFOL;  Surgeon: Annette Lame, MD;  Location: [facility] ENDOSCOPY;  Service: Endoscopy;   Laterality: N/A;  . ESOPHAGOGASTRODUODENOSCOPY (EGD) WITH PROPOFOL N/A 03/11/2016   Procedure: ESOPHAGOGASTRODUODENOSCOPY (EGD) WITH PROPOFOL;  Surgeon: Manya Silvas, MD;  Location: [facility] ENDOSCOPY;  Service: Endoscopy;  Laterality: N/A;  . ESOPHAGOGASTRODUODENOSCOPY (EGD) WITH PROPOFOL N/A 02/18/2020   Procedure: ESOPHAGOGASTRODUODENOSCOPY (EGD) WITH PROPOFOL;  Surgeon: Annette Lame, MD;  Location: [facility] ENDOSCOPY;  Service: Endoscopy;  Laterality: N/A;  . ESOPHAGOGASTRODUODENOSCOPY (EGD) WITH PROPOFOL N/A 04/25/2020   Procedure: ESOPHAGOGASTRODUODENOSCOPY (EGD) WITH PROPOFOL;  Surgeon: Annette Lame, MD;  Location: [facility] ENDOSCOPY;  Service: Endoscopy;  Laterality: N/A;  . FINGER ARTHROSCOPY WITH CARPOMETACARPEL ([facility]) ARTHROPLASTY Right 09/03/2018   Procedure: CARPOMETACARPEL [facility]) ARTHROPLASTY RIGHT THUMB;  Surgeon: Hessie Knows, MD;  Location: [facility] ORS;  Service: Orthopedics;  Laterality: Right;  . GANGLION CYST EXCISION Right 09/03/2018   Procedure: REMOVAL GANGLION OF WRIST;  Surgeon: Hessie Knows, MD;  Location: [facility] ORS;  Service: Orthopedics;  Laterality: Right;  . HARDWARE REMOVAL Right 09/03/2018   Procedure: HARDWARE REMOVAL RIGHT THUMB;  Surgeon: Hessie Knows, MD;  Location: [facility] ORS;  Service: Orthopedics;  Laterality: Right;  staple removed  . JOINT REPLACEMENT Left    TKR  . left sinusplasty     . MASTECTOMY, PARTIAL Left 10/17/2015   Procedure: MASTECTOMY PARTIAL REVISION;  Surgeon: Leonie Green, MD;  Location: [facility] ORS;  Service: General;  Laterality: Left;  . PARTIAL MASTECTOMY  WITH NEEDLE LOCALIZATION Left 09/29/2015   Procedure: PARTIAL MASTECTOMY WITH NEEDLE LOCALIZATION;  Surgeon: Leonie Green, MD;  Location: [facility] ORS;  Service: General;  Laterality: Left;  . TOTAL ABDOMINAL HYSTERECTOMY W/ BILATERAL SALPINGOOPHORECTOMY      Prior to Admission medications   Medication Sig Start Date End Date Taking? Authorizing Provider  desloratadine (CLARINEX) 5 MG tablet Take  1 tablet by mouth daily.   Yes [provider]  docusate sodium (COLACE) 100 MG capsule Take 1 capsule by mouth 2 (two) times daily as needed. 01/10/20  Yes [provider]  esomeprazole (NEXIUM) 40 MG capsule Take 40 mg by mouth 2 (two) times daily before a meal.  02/08/16  Yes [provider]  fexofenadine (ALLEGRA) 180 MG tablet Take 180 mg by mouth daily.   Yes [provider]  Hyoscyamine Sulfate 0.375 MG TBCR Take 0.375 mg by mouth 2 (two) times daily.    Yes [provider]  levothyroxine (SYNTHROID, LEVOTHROID) 75 MCG tablet Take 75 mcg by mouth daily before breakfast.    Yes [provider]  magnesium oxide (MAG-OX) 400 MG tablet Take 400 mg by mouth 2 (two) times daily.    Yes [provider]  nebivolol (BYSTOLIC) 2.5 MG tablet Take 1 tablet (2.5 mg total) by mouth daily. 04/26/20  Yes Vickie Epley, MD  NOVOLOG FLEXPEN 100 UNIT/ML FlexPen Inject 15 Units into the skin 3 (three) times daily with meals. Based on sliding scale 08/14/19  Yes [provider]  potassium chloride SA (K-DUR,KLOR-CON) 20 MEQ tablet Take 20 mEq by mouth 2 (two) times daily.    Yes [provider]  saccharomyces boulardii (FLORASTOR) 250 MG capsule Take 250 mg by mouth 2 (two) times daily.    Yes [provider]  torsemide (DEMADEX) 20 MG tablet Take 1 tablet (20 mg total) by mouth daily. 02/29/20 05/29/20 Yes Furth, Cadence H, PA-C  azelastine (OPTIVAR) 0.05 % ophthalmic solution 1 drop into affected eye    [provider]  benzonatate (TESSALON) 200 MG capsule Take 200 mg by mouth 3 (three) times daily as needed for cough.    [provider]  Cholecalciferol (VITAMIN D) 50 MCG (2000 UT) CAPS Take 2,000 Units by mouth daily.    [provider]  clobetasol ointment (TEMOVATE) 3.01 % Apply 1 application topically 2 (two) times a week. 11/18/19   Harlin Heys, MD  cyanocobalamin (,VITAMIN B-12,)  1000 MCG/ML injection 1,000 mcg every 30 (thirty) days. 04/23/19   [provider]  dicyclomine (BENTYL) 10 MG capsule Take 10 mg by mouth at bedtime.  05/16/19   [provider]  EPINEPHrine 0.3 mg/0.3 mL IJ SOAJ injection Inject 0.3 mg into the muscle as needed. 05/18/19   [provider]  escitalopram (LEXAPRO) 5 MG tablet Take 5 mg by mouth at bedtime.    [provider]  estradiol (ESTRACE) 0.1 MG/GM vaginal cream Place 1 Applicatorful vaginally as needed.    [provider]  fenofibrate 160 MG tablet Take 160 mg by mouth at bedtime.    [provider]  fluticasone (FLONASE) 50 MCG/ACT nasal spray Place 1 spray into the nose daily as needed for allergies.    [provider]  gabapentin (NEURONTIN) 300 MG capsule Take 300 mg by mouth at bedtime.  09/07/16 10/25/36  [provider]  glimepiride (AMARYL) 2 MG tablet Take 2 mg by mouth at bedtime.    [provider]  hydrOXYzine (ATARAX/VISTARIL) 25  MG tablet 1 tablet as needed    [provider]  Insulin Degludec (TRESIBA) 100 UNIT/ML SOLN Inject 54 Units into the skin at bedtime.    [provider]  ondansetron (ZOFRAN) 4 MG tablet Take 1 tablet (4 mg total) by mouth every 8 (eight) hours as needed for nausea or vomiting. 04/19/20   Annette Lame, MD  Semaglutide,0.25 or 0.5MG/DOS, 2 MG/1.5ML SOPN Inject 5 mg into the skin once a week. On Sunday. 11/22/19   [provider]  simvastatin (ZOCOR) 20 MG tablet Take 20 mg by mouth at bedtime.    [provider]  spironolactone (ALDACTONE) 25 MG tablet Take 1 tablet (25 mg total) by mouth daily. 02/18/20   Danford, Suann Larry, MD  valACYclovir (VALTREX) 500 MG tablet Take 500 mg by mouth See admin instructions. For fever blisters take 556m twice a day as needed 01/06/17   [provider]    Allergies as of 05/08/2020 - Review Complete 05/01/2020  Allergen Reaction Noted  . Codeine  Itching and Nausea And Vomiting 06/28/2014  . Demeclocycline Rash 04/10/2012  . Demerol [meperidine] Itching and Nausea And Vomiting 06/28/2014  . Hydrocodone Itching and Nausea And Vomiting 08/24/2018  . Oxycodone Itching and Nausea And Vomiting 09/26/2015  . Pentazocine Itching and Nausea And Vomiting 04/10/2012  . Tetracyclines & related Rash 06/28/2014  . Fentanyl Itching and Nausea And Vomiting 06/28/2014    Family History  Problem Relation Age of Onset  . Breast cancer Paternal Grandmother   . Colon cancer Father   . Diabetes Sister   . Diabetes Brother   . Heart disease Brother   . Prostate cancer Brother   . Colon cancer Maternal Uncle   . Prostate cancer Brother   . Bladder Cancer Brother   . Leukemia Mother        all  . Ovarian cancer Neg Hx   . Kidney cancer Neg Hx     Social History   Socioeconomic History  . Marital status: Married    Spouse name: Not on file  . Number of children: Not on file  . Years of education: Not on file  . Highest education level: Not on file  Occupational History  . Not on file  Tobacco Use  . Smoking status: Never Smoker  . Smokeless tobacco: Never Used  Vaping Use  . Vaping Use: Never used  Substance and Sexual Activity  . Alcohol use: No    Alcohol/week: 0.0 standard drinks  . Drug use: No  . Sexual activity: Not Currently    Birth control/protection: Surgical  Other Topics Concern  . Not on file  Social History Narrative  . Not on file   Social Determinants of Health   Financial Resource Strain: Not on file  Food Insecurity: Not on file  Transportation Needs: Not on file  Physical Activity: Not on file  Stress: Not on file  Social Connections: Not on file  Intimate Partner Violence: Not on file    Review of Systems: See HPI, otherwise negative ROS  Physical Exam: BP 109/72   Pulse (!) 47   Temp 97.6 F (36.4 C) (Temporal)   Resp 16   Ht 5' 3"  (1.6 m)   Wt 72.6 kg   LMP  (LMP Unknown)   SpO2 100%    BMI 28.34 kg/m  General:   Alert,  pleasant and cooperative in NAD Head:  Normocephalic and atraumatic. Neck:  Supple; no masses or thyromegaly. Lungs:  Clear throughout  to auscultation.    Heart:  Regular rate and rhythm. Abdomen:  Soft, nontender and nondistended. Normal bowel sounds, without guarding, and without rebound.   Neurologic:  Alert and  oriented x4;  grossly normal neurologically.  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  Questions have been answered.  All parties agreeable.   Annette Lame, MD  05/23/2020, 10:05 AM

## 2020-05-23 NOTE — Op Note (Signed)
[facility] Gastroenterology Patient Name: Annette Foster Procedure Date: 05/23/2020 9:53 AM MRN: [id number] Account #: [id number] Date of Birth: 09-10-1940 Admit Type: Outpatient Age: 80 Room: [facility] ENDO ROOM 4 Gender: Female Note Status: Finalized Procedure:             Upper GI endoscopy Indications:           For therapy of esophageal varices Providers:             [name] MD, MD Referring MD:          Leona Carry. Hall Busing, MD (Referring MD) Medicines:             Propofol per Anesthesia Complications:         No immediate complications. Procedure:             Pre-Anesthesia Assessment:                        - Prior to the procedure, a History and Physical was                         performed, and patient medications and allergies were                         reviewed. The patient's tolerance of previous                         anesthesia was also reviewed. The risks and benefits                         of the procedure and the sedation options and risks                         were discussed with the patient. All questions were                         answered, and informed consent was obtained. Prior                         Anticoagulants: The patient has taken no previous                         anticoagulant or antiplatelet agents. ASA Grade                         Assessment: III - A patient with severe systemic                         disease. After reviewing the risks and benefits, the                         patient was deemed in satisfactory condition to                         undergo the procedure.                        After obtaining informed consent, the endoscope was  passed under direct vision. Throughout the procedure,                         the patient's blood pressure, pulse, and oxygen                         saturations were monitored continuously. The Endoscope                         was introduced through the mouth,  and advanced to the                         second part of duodenum. The upper GI endoscopy was                         accomplished without difficulty. The patient tolerated                         the procedure well. Findings:      Grade III varices were found in the lower third of the esophagus. Three       bands were successfully placed. There was no bleeding during the       procedure.      Moderate portal hypertensive gastropathy was found in the entire       examined stomach.      The examined duodenum was normal. Impression:            - Grade III esophageal varices. Banded.                        - Portal hypertensive gastropathy.                        - Normal examined duodenum.                        - No specimens collected. Recommendation:        - Discharge patient to home.                        - Mechanical soft diet for 1 day.                        - Continue present medications. Procedure Code(s):     --- Professional ---                        [phone], Esophagogastroduodenoscopy, flexible,                         transoral; with band ligation of esophageal/gastric                         varices Diagnosis Code(s):     --- Professional ---                        I85.00, Esophageal varices without bleeding                        K76.6, Portal hypertension CPT copyright 2019 [employer]. All rights reserved. The codes documented  in this report are preliminary and upon coder review may  be revised to meet current compliance requirements. [name] MD, MD 05/23/2020 10:28:52 AM This report has been signed electronically. Number of Addenda: 0 Note Initiated On: 05/23/2020 9:53 AM Estimated Blood Loss:  Estimated blood loss: none.      [facility]

## 2020-05-24 ENCOUNTER — Encounter: Payer: Self-pay | Admitting: Gastroenterology

## 2020-06-08 ENCOUNTER — Other Ambulatory Visit: Payer: Self-pay

## 2020-06-08 ENCOUNTER — Ambulatory Visit (INDEPENDENT_AMBULATORY_CARE_PROVIDER_SITE_OTHER): Payer: Medicare PPO | Admitting: Obstetrics and Gynecology

## 2020-06-08 ENCOUNTER — Encounter: Payer: Self-pay | Admitting: Obstetrics and Gynecology

## 2020-06-08 VITALS — BP 150/54 | HR 55 | Ht 63.0 in | Wt 171.4 lb

## 2020-06-08 DIAGNOSIS — B373 Candidiasis of vulva and vagina: Secondary | ICD-10-CM | POA: Diagnosis not present

## 2020-06-08 DIAGNOSIS — L900 Lichen sclerosus et atrophicus: Secondary | ICD-10-CM

## 2020-06-08 DIAGNOSIS — L292 Pruritus vulvae: Secondary | ICD-10-CM | POA: Diagnosis not present

## 2020-06-08 DIAGNOSIS — B3731 Acute candidiasis of vulva and vagina: Secondary | ICD-10-CM

## 2020-06-08 MED ORDER — FLUCONAZOLE 150 MG PO TABS: 150.0000 mg | ORAL_TABLET | ORAL | 0 refills | Status: DC

## 2020-06-08 NOTE — Progress Notes (Signed)
HPI:      [name] is a 80 y.o. [phone] who LMP was No LMP recorded (lmp unknown). Patient has had a hysterectomy.  Subjective:   She presents today for follow-up of chronic vulvitis/lichen sclerosus.  Patient has been using clobetasol twice weekly and reports it is better than it was but still not good enough.  She reports that after taking Diflucan she saw some improvement but once she stopped she had the symptoms again. Patient also complains of occasional stool leakage and reports that her stool is very soft. She has recently undergone banding of esophageal varices with GI. Patient is a diabetic and reports that her sugars are better than they have been in a while because she has modified her diet.   Hx: The following portions of the patient's history were reviewed and updated as appropriate:             She  has a past medical history of Abdominal pain, Allergy, Cancer ([place]) (2017), Cataract, CHF (congestive heart failure) ([place]), Collagenous colitis, Diabetes mellitus without complication ([place]), Diarrhea, Diverticulosis, Fatty liver disease, nonalcoholic, Fibrocystic breast, GERD (gastroesophageal reflux disease), Heart murmur, Hyperlipidemia, Hypertension, Hypothyroidism, IBS (irritable bowel syndrome), IDA (iron deficiency anemia), Personal history of radiation therapy (2017), PONV (postoperative nausea and vomiting), and Sleep apnea. She does not have any pertinent problems on file. She  has a past surgical history that includes Cardiac surgery; Abdominal surgery; Cholecystectomy; left sinusplasty ; Joint replacement (Left); Cataract extraction (Right); Partial mastectomy with needle localization (Left, 09/29/2015); Mastectomy, partial (Left, 10/17/2015); Total abdominal hysterectomy w/ bilateral salpingoophorectomy; Appendectomy; Colonoscopy with propofol (N/A, 03/11/2016); Esophagogastroduodenoscopy (egd) with propofol (N/A, 03/11/2016); Abdominal hysterectomy; Hardware Removal  (Right, 09/03/2018); Ganglion cyst excision (Right, 09/03/2018); Finger arthroscopy with carpometacarpel(cmc) arthroplasty (Right, 09/03/2018); Breast biopsy (Bilateral, 2016); Breast biopsy (Left, 09/11/2015); Breast biopsy (Left, 05/27/2016); Breast biopsy (Left, 11/20/2017); Breast lumpectomy (Left, 10/17/2015); Esophagogastroduodenoscopy (egd) with propofol (N/A, 02/18/2020); Colonoscopy with propofol (N/A, 02/18/2020); Esophagogastroduodenoscopy (egd) with propofol (N/A, 04/25/2020); and Esophagogastroduodenoscopy (egd) with propofol (N/A, 05/23/2020). Her family history includes Bladder Cancer in her brother; Breast cancer in her paternal grandmother; Colon cancer in her father and maternal uncle; Diabetes in her brother and sister; Heart disease in her brother; Leukemia in her mother; Prostate cancer in her brother and brother. She  reports that she has never smoked. She has never used smokeless tobacco. She reports that she does not drink alcohol and does not use drugs. She has a current medication list which includes the following prescription(s): azelastine, benzonatate, vitamin d, clobetasol ointment, cyanocobalamin, desloratadine, dicyclomine, docusate sodium, epinephrine, escitalopram, esomeprazole, estradiol, fenofibrate, fexofenadine, fluconazole, fluticasone, gabapentin, glimepiride, hydroxyzine, hyoscyamine sulfate, tresiba, levothyroxine, magnesium oxide, nebivolol, novolog flexpen, ondansetron, potassium chloride sa, saccharomyces boulardii, semaglutide(0.25 or 0.44m/dos), simvastatin, spironolactone, valacyclovir, and torsemide. She is allergic to codeine, demeclocycline, demerol [meperidine], hydrocodone, oxycodone, pentazocine, tetracyclines & related, and fentanyl.       Review of Systems:  Review of Systems  Constitutional: Denied constitutional symptoms, night sweats, recent illness, fatigue, fever, insomnia and weight loss.  Eyes: Denied eye symptoms, eye pain, photophobia, vision change  and visual disturbance.  Ears/Nose/Throat/Neck: Denied ear, nose, throat or neck symptoms, hearing loss, nasal discharge, sinus congestion and sore throat.  Cardiovascular: Denied cardiovascular symptoms, arrhythmia, chest pain/pressure, edema, exercise intolerance, orthopnea and palpitations.  Respiratory: Denied pulmonary symptoms, asthma, pleuritic pain, productive sputum, cough, dyspnea and wheezing.  Gastrointestinal: Denied, gastro-esophageal reflux, melena, nausea and vomiting.  Genitourinary: See HPI for additional information.  Musculoskeletal: Denied  musculoskeletal symptoms, stiffness, swelling, muscle weakness and myalgia.  Dermatologic: Denied dermatology symptoms, rash and scar.  Neurologic: Denied neurology symptoms, dizziness, headache, neck pain and syncope.  Psychiatric: Denied psychiatric symptoms, anxiety and depression.  Endocrine: Denied endocrine symptoms including hot flashes and night sweats.   Meds:   Current Outpatient Medications on File Prior to Visit  Medication Sig Dispense Refill  . azelastine (OPTIVAR) 0.05 % ophthalmic solution 1 drop into affected eye    . benzonatate (TESSALON) 200 MG capsule Take 200 mg by mouth 3 (three) times daily as needed for cough.    . Cholecalciferol (VITAMIN D) 50 MCG (2000 UT) CAPS Take 2,000 Units by mouth daily.    . clobetasol ointment (TEMOVATE) 2.62 % Apply 1 application topically 2 (two) times a week. 45 g 1  . cyanocobalamin (,VITAMIN B-12,) 1000 MCG/ML injection 1,000 mcg every 30 (thirty) days.    Marland Kitchen desloratadine (CLARINEX) 5 MG tablet Take 1 tablet by mouth daily.    Marland Kitchen dicyclomine (BENTYL) 10 MG capsule Take 10 mg by mouth at bedtime.     . docusate sodium (COLACE) 100 MG capsule Take 1 capsule by mouth 2 (two) times daily as needed.    Marland Kitchen EPINEPHrine 0.3 mg/0.3 mL IJ SOAJ injection Inject 0.3 mg into the muscle as needed.    Marland Kitchen escitalopram (LEXAPRO) 5 MG tablet Take 5 mg by mouth at bedtime.    Marland Kitchen esomeprazole (NEXIUM)  40 MG capsule Take 40 mg by mouth 2 (two) times daily before a meal.     . estradiol (ESTRACE) 0.1 MG/GM vaginal cream Place 1 Applicatorful vaginally as needed.    . fenofibrate 160 MG tablet Take 160 mg by mouth at bedtime.    . fexofenadine (ALLEGRA) 180 MG tablet Take 180 mg by mouth daily.    . fluticasone (FLONASE) 50 MCG/ACT nasal spray Place 1 spray into the nose daily as needed for allergies.    Marland Kitchen gabapentin (NEURONTIN) 300 MG capsule Take 300 mg by mouth at bedtime.     Marland Kitchen glimepiride (AMARYL) 2 MG tablet Take 2 mg by mouth at bedtime.    . hydrOXYzine (ATARAX/VISTARIL) 25 MG tablet 1 tablet as needed    . Hyoscyamine Sulfate 0.375 MG TBCR Take 0.375 mg by mouth 2 (two) times daily.     . Insulin Degludec (TRESIBA) 100 UNIT/ML SOLN Inject 54 Units into the skin at bedtime.    Marland Kitchen levothyroxine (SYNTHROID, LEVOTHROID) 75 MCG tablet Take 75 mcg by mouth daily before breakfast.     . magnesium oxide (MAG-OX) 400 MG tablet Take 400 mg by mouth 2 (two) times daily.     . nebivolol (BYSTOLIC) 2.5 MG tablet Take 1 tablet (2.5 mg total) by mouth daily. 90 tablet 3  . NOVOLOG FLEXPEN 100 UNIT/ML FlexPen Inject 15 Units into the skin 3 (three) times daily with meals. Based on sliding scale    . ondansetron (ZOFRAN) 4 MG tablet Take 1 tablet (4 mg total) by mouth every 8 (eight) hours as needed for nausea or vomiting. 20 tablet 3  . potassium chloride SA (K-DUR,KLOR-CON) 20 MEQ tablet Take 20 mEq by mouth 2 (two) times daily.     Marland Kitchen saccharomyces boulardii (FLORASTOR) 250 MG capsule Take 250 mg by mouth 2 (two) times daily.     . Semaglutide,0.25 or 0.5MG/DOS, 2 MG/1.5ML SOPN Inject 5 mg into the skin once a week. On Sunday.    . simvastatin (ZOCOR) 20 MG tablet Take 20 mg by mouth  at bedtime.    Marland Kitchen spironolactone (ALDACTONE) 25 MG tablet Take 1 tablet (25 mg total) by mouth daily. 30 tablet 8  . valACYclovir (VALTREX) 500 MG tablet Take 500 mg by mouth See admin instructions. For fever blisters take  548m twice a day as needed  1  . torsemide (DEMADEX) 20 MG tablet Take 1 tablet (20 mg total) by mouth daily. 90 tablet 0   No current facility-administered medications on file prior to visit.          Objective:     Vitals:   06/08/20 1049  BP: (!) 150/54  Pulse: (!) 55   Filed Weights   06/08/20 1049  Weight: 171 lb 6.4 oz (77.7 kg)              Examination of the labia/vulva reveals significant erythema and irritation.  Some lichen sclerosus change around the clitoral hood and the anterior aspect.  Irritation/erythema goes down around the rectum.  Assessment:    [id number] Active Problem List   Diagnosis Date Noted  . Portal hypertension ([facility]   . Esophageal varices without bleeding ([place]   . Chronic heart failure with preserved ejection fraction (HFpEF) ([facility] 04/05/2020  . Cirrhosis of liver with ascites ([facility] 04/05/2020  . Abnormal EKG 04/05/2020  . Allergic rhinitis due to pollen 02/24/2020  . Chronic allergic conjunctivitis 02/24/2020  . Allergic rhinitis due to animal (cat) (dog) hair and dander 02/24/2020  . Allergic rhinitis 02/24/2020  . Polyp of transverse colon   . Hematochezia 02/17/2020  . Chronic anticoagulation 02/17/2020  . Anasarca 02/17/2020  . Symptomatic sinus bradycardia 02/17/2020  . PAF (paroxysmal atrial fibrillation) ([facility] 02/17/2020  . AKI (acute kidney injury) ([facility] 02/17/2020  . History of colitis 02/17/2020  . Gastrointestinal hemorrhage   . Atrial fibrillation with RVR ([facility] 02/07/2020  . NASH (nonalcoholic steatohepatitis)   . Hav (hallux abducto valgus), unspecified laterality 06/03/2019  . Acquired bilateral hammer toes 06/03/2019  . Iron deficiency 04/26/2019  . Right-sided chest wall pain 04/12/2019  . Mid back pain on right side 04/12/2019  . 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 Recurrent UTI (urinary tract infection) 07/23/2017  . Nausea vomiting and diarrhea 01/11/2017  . Hypothyroidism  01/11/2017  . HTN (hypertension) 01/11/2017  . Diabetes ([facility] 01/11/2017  . GERD (gastroesophageal reflux disease) 01/11/2017  . Other pancytopenia ([facility] 11/14/2016  . 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 Status post hysterectomy 06/20/2016  . 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 Vaginal atrophy 01/12/2016  . 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 Vulvar itching   2. Lichen sclerosus   3. Monilial vulvovaginitis     I suspect both lichen sclerosus and chronic monilia infection concurrently.   Plan:            1.  Will attempt 2 months of Diflucan to see if this makes a significant difference.  Patient will also use topical antifungals 1 time per day.  2.  Continue clobetasol as directed to affected areas as directed.  3.  Patient to follow-up in 3 months -if no significant improvement consider referral to vulvar specialist. Orders No orders of the defined types were placed in this encounter.    Meds ordered this encounter  Medications  . fluconazole (DIFLUCAN) 150 MG tablet    Sig: Take 1 tablet (150 mg total) by mouth once a week. For three doses  Dispense:  8 tablet    Refill:  0      F/U  No follow-ups on file. I spent 23 minutes involved in the care of this patient preparing to see the patient by obtaining and reviewing her medical history (including labs, imaging tests and prior procedures), documenting clinical information in the electronic health record (EHR), counseling and coordinating care plans, writing and sending prescriptions, ordering tests or procedures and directly communicating with the patient by discussing pertinent items from her history and physical exam as well as detailing my assessment and plan as noted above so that she has an informed understanding.  All of her questions were answered.  Finis Bud, M.D. 06/08/2020 11:22 AM

## 2020-06-09 ENCOUNTER — Encounter: Payer: Medicare PPO | Admitting: Obstetrics and Gynecology

## 2020-06-21 ENCOUNTER — Other Ambulatory Visit: Payer: Self-pay

## 2020-06-21 ENCOUNTER — Encounter: Payer: Self-pay | Admitting: Cardiology

## 2020-06-21 ENCOUNTER — Ambulatory Visit (INDEPENDENT_AMBULATORY_CARE_PROVIDER_SITE_OTHER): Payer: Medicare PPO | Admitting: Cardiology

## 2020-06-21 VITALS — BP 148/66 | HR 60 | Ht 63.0 in | Wt 162.0 lb

## 2020-06-21 DIAGNOSIS — R188 Other ascites: Secondary | ICD-10-CM

## 2020-06-21 DIAGNOSIS — I4891 Unspecified atrial fibrillation: Secondary | ICD-10-CM

## 2020-06-21 DIAGNOSIS — I4892 Unspecified atrial flutter: Secondary | ICD-10-CM

## 2020-06-21 DIAGNOSIS — K7460 Unspecified cirrhosis of liver: Secondary | ICD-10-CM | POA: Diagnosis not present

## 2020-06-21 DIAGNOSIS — I5032 Chronic diastolic (congestive) heart failure: Secondary | ICD-10-CM | POA: Diagnosis not present

## 2020-06-21 DIAGNOSIS — K922 Gastrointestinal hemorrhage, unspecified: Secondary | ICD-10-CM

## 2020-06-21 NOTE — Patient Instructions (Addendum)
Medication Instructions:  Your physician recommends that you continue on your current medications as directed. Please refer to the Current Medication list given to you today. *If you need a refill on your cardiac medications before your next appointment, please call your pharmacy*  Lab Work: None ordered. If you have labs (blood work) drawn today and your tests are completely normal, you will receive your results only by: Marland Kitchen MyChart Message (if you have MyChart) OR . A paper copy in the mail If you have any lab test that is abnormal or we need to change your treatment, we will call you to review the results.  Testing/Procedures: None ordered.  Follow-Up: At [facility], you and your health needs are our priority.  As part of our continuing mission to provide you with exceptional heart care, we have created designated Provider Care Teams.  These Care Teams include your primary Cardiologist (physician) and Advanced Practice Providers (APPs -  Physician Assistants and Nurse Practitioners) who all work together to provide you with the care you need, when you need it.  Your next appointment:   Your physician wants you to follow-up as needed with Dr. Quentin Ore.

## 2020-06-21 NOTE — Progress Notes (Signed)
Electrophysiology Office Follow up Visit Note:    Date:  06/21/2020   ID:  Annette Foster, DOB [birth year], MRN [id number]  PCP:  Albina Billet, MD  [facility] Cardiologist:  Nelva Bush, MD  [facility] Electrophysiologist:  Vickie Epley, MD    Interval History:    Annette Foster is a 80 y.o. female who presents for a follow up visit for her atrial fibrillation and flutter.  I last saw the patient April 26, 2020 for consideration of watchman.  She has a history of esophageal varices that required several banding procedures.  She was not taking anticoagulation the last time I saw her and she has not restarted the medication.  She did recently have another EGD with banding performed on May 23, 2020.  Her GI doctors are not planning on performing another banding procedure.  She is not currently having any evidence of GI bleeding.  The last time she saw Laurann Montana was May 17, 2020.  At that appointment she was in atrial flutter.  Today she tells me that she is feeling overall well.  She is recovered well since her last EGD procedure.  No evidence of bleeding.  She is very hesitant to restart any anticoagulation.     Past Medical History:  Diagnosis Date  . Abdominal pain   . Allergy   . Cancer [facility]) 2017   breast cancer- Left  . Cataract   . CHF (congestive heart failure) ([place])   . Collagenous colitis   . Diabetes mellitus without complication ([place])   . Diarrhea   . Diverticulosis   . Fatty liver disease, nonalcoholic   . Fibrocystic breast   . GERD (gastroesophageal reflux disease)   . Heart murmur   . Hyperlipidemia   . Hypertension   . Hypothyroidism   . IBS (irritable bowel syndrome)   . IDA (iron deficiency anemia)   . Personal history of radiation therapy 2017   LEFT BREAST CA  . PONV (postoperative nausea and vomiting)   . Sleep apnea    C-Pap    Past Surgical History:  Procedure Laterality Date  . ABDOMINAL HYSTERECTOMY     [place]  bso  . ABDOMINAL SURGERY    . APPENDECTOMY    . BREAST BIOPSY Bilateral 2016   negative  . 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 BREAST BIOPSY Left 05/27/2016   BENIGN MAMMARY EPITHELIUM  . BREAST BIOPSY Left 11/20/2017   affirm bx x clip BENIGN MAMMARY EPITHELIUM CONSISTENT WITH RAD THERAPY  . BREAST LUMPECTOMY Left 10/17/2015   DCIS and papillary carcinoma insitu, clear margins  . CARDIAC SURGERY     has replacement valve  . CATARACT EXTRACTION Right   . CHOLECYSTECTOMY    . COLONOSCOPY WITH PROPOFOL N/A 03/11/2016   Procedure: COLONOSCOPY WITH PROPOFOL;  Surgeon: Manya Silvas, MD;  Location: [facility] ENDOSCOPY;  Service: Endoscopy;  Laterality: N/A;  . COLONOSCOPY WITH PROPOFOL N/A 02/18/2020   Procedure: COLONOSCOPY WITH PROPOFOL;  Surgeon: [name], MD;  Location: [facility] ENDOSCOPY;  Service: Endoscopy;  Laterality: N/A;  . ESOPHAGOGASTRODUODENOSCOPY (EGD) WITH PROPOFOL N/A 03/11/2016   Procedure: ESOPHAGOGASTRODUODENOSCOPY (EGD) WITH PROPOFOL;  Surgeon: Manya Silvas, MD;  Location: [facility] ENDOSCOPY;  Service: Endoscopy;  Laterality: N/A;  . ESOPHAGOGASTRODUODENOSCOPY (EGD) WITH PROPOFOL N/A 02/18/2020   Procedure: ESOPHAGOGASTRODUODENOSCOPY (EGD) WITH PROPOFOL;  Surgeon: [name], MD;  Location: [facility] ENDOSCOPY;  Service: Endoscopy;  Laterality: N/A;  . ESOPHAGOGASTRODUODENOSCOPY (EGD) WITH PROPOFOL  N/A 04/25/2020   Procedure: ESOPHAGOGASTRODUODENOSCOPY (EGD) WITH PROPOFOL;  Surgeon: [name], MD;  Location: [facility] ENDOSCOPY;  Service: Endoscopy;  Laterality: N/A;  . ESOPHAGOGASTRODUODENOSCOPY (EGD) WITH PROPOFOL N/A 05/23/2020   Procedure: ESOPHAGOGASTRODUODENOSCOPY (EGD) WITH PROPOFOL;  Surgeon: [name], MD;  Location: [facility] ENDOSCOPY;  Service: Endoscopy;  Laterality: N/A;  . FINGER ARTHROSCOPY WITH CARPOMETACARPEL ([place]) ARTHROPLASTY Right 09/03/2018   Procedure: CARPOMETACARPEL [facility]) ARTHROPLASTY RIGHT THUMB;  Surgeon: Hessie Knows, MD;   Location: [facility] ORS;  Service: Orthopedics;  Laterality: Right;  . GANGLION CYST EXCISION Right 09/03/2018   Procedure: REMOVAL GANGLION OF WRIST;  Surgeon: Hessie Knows, MD;  Location: [facility] ORS;  Service: Orthopedics;  Laterality: Right;  . HARDWARE REMOVAL Right 09/03/2018   Procedure: HARDWARE REMOVAL RIGHT THUMB;  Surgeon: Hessie Knows, MD;  Location: [facility] ORS;  Service: Orthopedics;  Laterality: Right;  staple removed  . JOINT REPLACEMENT Left    TKR  . left sinusplasty     . MASTECTOMY, PARTIAL Left 10/17/2015   Procedure: MASTECTOMY PARTIAL REVISION;  Surgeon: Leonie Green, MD;  Location: [facility] ORS;  Service: General;  Laterality: Left;  . PARTIAL MASTECTOMY WITH NEEDLE LOCALIZATION Left 09/29/2015   Procedure: PARTIAL MASTECTOMY WITH NEEDLE LOCALIZATION;  Surgeon: Leonie Green, MD;  Location: [facility] ORS;  Service: General;  Laterality: Left;  . TOTAL ABDOMINAL HYSTERECTOMY W/ BILATERAL SALPINGOOPHORECTOMY      Current Medications: Current Meds  Medication Sig  . azelastine (OPTIVAR) 0.05 % ophthalmic solution 1 drop into affected eye  . benzonatate (TESSALON) 200 MG capsule Take 200 mg by mouth 3 (three) times daily as needed for cough.  . Cholecalciferol (VITAMIN D) 50 MCG (2000 UT) CAPS Take 2,000 Units by mouth daily.  . clobetasol ointment (TEMOVATE) 3.57 % Apply 1 application topically 2 (two) times a week.  . cyanocobalamin (,VITAMIN B-12,) 1000 MCG/ML injection 1,000 mcg every 30 (thirty) days.  Marland Kitchen desloratadine (CLARINEX) 5 MG tablet Take 1 tablet by mouth daily.  Marland Kitchen dicyclomine (BENTYL) 10 MG capsule Take 10 mg by mouth at bedtime.   . docusate sodium (COLACE) 100 MG capsule Take 1 capsule by mouth 2 (two) times daily as needed.  . Dulaglutide (TRULICITY) 0.17 BL/3.9QZ SOPN Inject 0.75 mg into the skin once a week.  Marland Kitchen EPINEPHrine 0.3 mg/0.3 mL IJ SOAJ injection Inject 0.3 mg into the muscle as needed.  Marland Kitchen escitalopram (LEXAPRO) 5 MG tablet Take 5 mg by mouth at  bedtime.  Marland Kitchen esomeprazole (NEXIUM) 40 MG capsule Take 40 mg by mouth 2 (two) times daily before a meal.   . estradiol (ESTRACE) 0.1 MG/GM vaginal cream Place 1 Applicatorful vaginally as needed.  . fenofibrate 160 MG tablet Take 160 mg by mouth at bedtime.  . fexofenadine (ALLEGRA) 180 MG tablet Take 180 mg by mouth daily.  . fluconazole (DIFLUCAN) 150 MG tablet Take 1 tablet (150 mg total) by mouth once a week. For three doses  . fluticasone (FLONASE) 50 MCG/ACT nasal spray Place 1 spray into the nose daily as needed for allergies.  Marland Kitchen gabapentin (NEURONTIN) 300 MG capsule Take 300 mg by mouth at bedtime.   Marland Kitchen glimepiride (AMARYL) 2 MG tablet Take 2 mg by mouth at bedtime.  . hydrOXYzine (ATARAX/VISTARIL) 25 MG tablet 1 tablet as needed  . Hyoscyamine Sulfate 0.375 MG TBCR Take 0.375 mg by mouth 2 (two) times daily.   . Insulin Degludec (TRESIBA) 100 UNIT/ML SOLN Inject 54 Units into the skin at bedtime.  Marland Kitchen levothyroxine (SYNTHROID, LEVOTHROID) 75 MCG tablet  Take 75 mcg by mouth daily before breakfast.   . magnesium oxide (MAG-OX) 400 MG tablet Take 400 mg by mouth 2 (two) times daily.   . nebivolol (BYSTOLIC) 2.5 MG tablet Take 1 tablet (2.5 mg total) by mouth daily.  Marland Kitchen NOVOLOG FLEXPEN 100 UNIT/ML FlexPen Inject 15 Units into the skin 3 (three) times daily with meals. Based on sliding scale  . ondansetron (ZOFRAN) 4 MG tablet Take 1 tablet (4 mg total) by mouth every 8 (eight) hours as needed for nausea or vomiting.  . potassium chloride SA (K-DUR,KLOR-CON) 20 MEQ tablet Take 20 mEq by mouth 2 (two) times daily.   Marland Kitchen saccharomyces boulardii (FLORASTOR) 250 MG capsule Take 250 mg by mouth 2 (two) times daily.   . simvastatin (ZOCOR) 20 MG tablet Take 20 mg by mouth at bedtime.  Marland Kitchen spironolactone (ALDACTONE) 25 MG tablet Take 1 tablet (25 mg total) by mouth daily.  . valACYclovir (VALTREX) 500 MG tablet Take 500 mg by mouth See admin instructions. For fever blisters take 546m twice a day as needed   . [DISCONTINUED] Semaglutide,0.25 or 0.5MG/DOS, 2 MG/1.5ML SOPN Inject 5 mg into the skin once a week. On Sunday.     Allergies:   Codeine, Demeclocycline, Demerol [meperidine], Hydrocodone, Oxycodone, Pentazocine, Tetracyclines & related, and Fentanyl   Social History   Socioeconomic History  . Marital status: Married    Spouse name: Not on file  . Number of children: Not on file  . Years of education: Not on file  . Highest education level: Not on file  Occupational History  . Not on file  Tobacco Use  . Smoking status: Never Smoker  . Smokeless tobacco: Never Used  Vaping Use  . Vaping Use: Never used  Substance and Sexual Activity  . Alcohol use: No    Alcohol/week: 0.0 standard drinks  . Drug use: No  . Sexual activity: Not Currently    Birth control/protection: Surgical  Other Topics Concern  . Not on file  Social History Narrative  . Not on file   Social Determinants of Health   Financial Resource Strain: Not on file  Food Insecurity: Not on file  Transportation Needs: Not on file  Physical Activity: Not on file  Stress: Not on file  Social Connections: Not on file     Family History: The patient's family history includes Bladder Cancer in her brother; Breast cancer in her paternal grandmother; Colon cancer in her father and maternal uncle; Diabetes in her brother and sister; Heart disease in her brother; Leukemia in her mother; Prostate cancer in her brother and brother. There is no history of Ovarian cancer or Kidney cancer.  ROS:   Please see the history of present illness.    All other systems reviewed and are negative.  EKGs/Labs/Other Studies Reviewed:    The following studies were reviewed today:  May 23, 2020 EGD reviewed Esophageal banding performed  EKG:  The ekg ordered today demonstrates sinus rhythm with a ventricular rate of 60 bpm  Recent Labs: 02/07/2020: Magnesium 2.3; TSH 3.169 02/16/2020: B Natriuretic Peptide 377.8 04/07/2020:  ALT 21 05/16/2020: BUN 16; Creatinine, Ser 0.86; Hemoglobin 12.0; Platelets 70; Potassium 4.4; Sodium 133  Recent Lipid Panel No results found for: CHOL, TRIG, HDL, CHOLHDL, VLDL, LDLCALC, LDLDIRECT  Physical Exam:    VS:  BP (!) 148/66   Pulse 60   Ht 5' 3"  (1.6 m)   Wt 162 lb (73.5 kg)   LMP  (LMP Unknown)  BMI 28.70 kg/m     Wt Readings from Last 3 Encounters:  06/21/20 162 lb (73.5 kg)  06/08/20 171 lb 6.4 oz (77.7 kg)  05/23/20 160 lb (72.6 kg)     GEN:  Well nourished, well developed in no acute distress HEENT: Normal NECK: No JVD; No carotid bruits LYMPHATICS: No lymphadenopathy CARDIAC: RRR, no murmurs, rubs, gallops RESPIRATORY:  Clear to auscultation without rales, wheezing or rhonchi  ABDOMEN: Soft, non-tender, non-distended MUSCULOSKELETAL:  No edema; No deformity  SKIN: Warm and dry NEUROLOGIC:  Alert and oriented x 3 PSYCHIATRIC:  Normal affect   ASSESSMENT:    1. Atrial fibrillation with RVR ([place])   2. Atrial flutter, unspecified type ([place])   3. Chronic heart failure with preserved ejection fraction (HFpEF) ([place])   4. Cirrhosis of liver with ascites, unspecified hepatic cirrhosis type ([place])   5. Gastrointestinal hemorrhage, unspecified gastrointestinal hemorrhage type    PLAN:    In order of problems listed above:  1. Atrial fibrillation and flutter, paroxysmal In normal rhythm today.  Not currently taking anticoagulation given a history of severe GI bleeding secondary to esophageal varices.  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  We have previously discussed using the watchman device to add stroke protection while avoiding long-term exposure to anticoagulation.  We discussed the need for short-term anticoagulation around the time of the watchman device implant.  She is concerned that even the short-term anticoagulation may prove difficult given her history of severe bleeding secondary to esophageal varices.  She would like some time to think about  it and discuss the situation with her GI doctor which I think is very reasonable.  She will let us know if she would like to proceed with scheduling.  I do think she would be a good candidate if she decides to proceed.  2.  Chronic diastolic heart failure Appears relatively euvolemic today.  She tells me she is overall doing better now that she has started standing torsemide.  She should continue her torsemide, spironolactone, nebivolol.  3.  History of GI bleeding secondary to esophageal varices secondary to [facility] cirrhosis Post multiple esophageal varices banding procedures Off anticoagulation at this time No evidence of current GI bleeding  Follow-up as needed.  Total time spent with patient today 35 minutes. This includes reviewing records, evaluating the patient and coordinating care.   Medication Adjustments/Labs and Tests Ordered: Current medicines are reviewed at length with the patient today.  Concerns regarding medicines are outlined above.  No orders of the defined types were placed in this encounter.  No orders of the defined types were placed in this encounter.    Signed, Lars Mage, MD, [facility], [facility] 06/21/2020 12:11 PM    Electrophysiology [facility]

## 2020-07-13 ENCOUNTER — Ambulatory Visit (INDEPENDENT_AMBULATORY_CARE_PROVIDER_SITE_OTHER): Payer: Medicare PPO | Admitting: Medical

## 2020-07-13 ENCOUNTER — Encounter: Payer: Self-pay | Admitting: Medical

## 2020-07-13 ENCOUNTER — Other Ambulatory Visit: Payer: Self-pay

## 2020-07-13 VITALS — BP 110/58 | HR 49 | Ht 63.0 in | Wt 165.0 lb

## 2020-07-13 DIAGNOSIS — I8500 Esophageal varices without bleeding: Secondary | ICD-10-CM

## 2020-07-13 DIAGNOSIS — K922 Gastrointestinal hemorrhage, unspecified: Secondary | ICD-10-CM

## 2020-07-13 DIAGNOSIS — R188 Other ascites: Secondary | ICD-10-CM

## 2020-07-13 DIAGNOSIS — D61818 Other pancytopenia: Secondary | ICD-10-CM

## 2020-07-13 DIAGNOSIS — K7581 Nonalcoholic steatohepatitis (NASH): Secondary | ICD-10-CM

## 2020-07-13 DIAGNOSIS — I4891 Unspecified atrial fibrillation: Secondary | ICD-10-CM

## 2020-07-13 DIAGNOSIS — I5032 Chronic diastolic (congestive) heart failure: Secondary | ICD-10-CM

## 2020-07-13 DIAGNOSIS — K7460 Unspecified cirrhosis of liver: Secondary | ICD-10-CM

## 2020-07-13 NOTE — Patient Instructions (Signed)
Medication Instructions:  Your physician recommends that you continue on your current medications as directed. Please refer to the Current Medication list given to you today.  *If you need a refill on your cardiac medications before your next appointment, please call your pharmacy*   Lab Work: None ordered  If you have labs (blood work) drawn today and your tests are completely normal, you will receive your results only by: Marland Kitchen MyChart Message (if you have MyChart) OR . A paper copy in the mail If you have any lab test that is abnormal or we need to change your treatment, we will call you to review the results.   Testing/Procedures: None ordered   Follow-Up: At [facility], you and your health needs are our priority.  As part of our continuing mission to provide you with exceptional heart care, we have created designated Provider Care Teams.  These Care Teams include your primary Cardiologist (physician) and Advanced Practice Providers (APPs -  Physician Assistants and Nurse Practitioners) who all work together to provide you with the care you need, when you need it.  We recommend signing up for the patient portal called "MyChart".  Sign up information is provided on this After Visit Summary.  MyChart is used to connect with patients for Virtual Visits (Telemedicine).  Patients are able to view lab/test results, encounter notes, upcoming appointments, etc.  Non-urgent messages can be sent to your provider as well.   To learn more about what you can do with MyChart, go to [email or web address].    Your next appointment:   3 month(s)  The format for your next appointment:   In Person  Provider:   You may see Nelva Bush, MD or one of the following Advanced Practice Providers on your designated Care Team:    Murray Hodgkins, NP  Christell Faith, PA-C  Marrianne Mood, PA-C  Cadence [place], [place]  Laurann Montana, NP    Other Instructions

## 2020-07-13 NOTE — Progress Notes (Signed)
Cardiology Office Note:    Date:  07/13/2020   ID:  Annette Foster, DOB [birth year], MRN [id number]  PCP:  Albina Billet, MD  [facility] Cardiologist:  Nelva Bush, MD  [facility] Electrophysiologist:  Vickie Epley, MD   Referring MD: Albina Billet, MD   Chief Complaint: 2-3 month follow-up  History of Present Illness:    Annette Foster is a 80 y.o. 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   She saw Dr. Saunders Revel 03/2020 and noted fatigue. Heart rate was 50bpm and coreg was discontinued. She was referred to Dr. Quentin Ore for management of afib. She was noted to have anterolateral ST/T wave changes which dated back to at least 2017 which are suspected due to abnormal repolarization though ischemic heart disease could not be excluded. As she was without anginal symptoms ischemic evaluation was deferred.   04/23/20 she had EGD with esophageal banding and subsequent repeat banding after 4 weeks.   She saw Dr. Quentin Ore 04/26/20 and was in afib at the time. She was recommended Nebivolol 2.78m daily. Rhythm control was unable to be pursued as she was off anticoagulation. Watchman discussion was initiated. Plan was to complete esophageal banding prior to watchman, she will require 3 weeks of anticoagulation prior to watchman implant in 45 days after.   Seen 05/17/20 in clinic and was in Afib with rates up to 120s and she was symptomatic with palpitations. Patient discussed with Dr. LQuentin Orewho recommended current dose of bystolic of 27.9XTdaily with follow-up with him in a month.   She had repeat banding 4/12. GI MD felt patient would not need repeat banding.  Saw Dr. LQuentin Ore5/11/22 and reported no further GI procedure. Again watchman was discussed however with h/o GIB she wanted to discuss anticoagulation with GI  MD first. Felt to be good watchman candidate.  Today, the patient reports she is doing well. Reports no further episodes of afib. She is in normal rhythm with heart rate in the office low at 49 bpm, however patient reports it's normally 60bpm. She is on low dose of Bystolic 20.5WPdaily. No chest pain or shortness of breath. Patient has appointment with GI end of June to discuss short term anticoagulation for Watchmen. The patient denies recurrent bleeding. Reports stable LLE. Weights labile, today 165lbs, overall stable. BP wnl.   Past Medical History:  Diagnosis Date  . Abdominal pain   . Allergy   . Cancer ([facility] 2017   breast cancer- Left  . Cataract   . [place]   . Collagenous colitis   . Diabetes mellitus without complication ([place]   . Diarrhea   . Diverticulosis   . Fatty liver disease, nonalcoholic   . Fibrocystic breast   . GERD (gastroesophageal reflux disease)   . Heart murmur   . Hyperlipidemia   . Hypertension   . Hypothyroidism   . IBS (irritable bowel syndrome)   . IDA (iron deficiency anemia)   . Personal history of radiation therapy 2017   LEFT BREAST CA  . PONV (postoperative nausea and vomiting)   . Sleep apnea    C-Pap    Past Surgical History:  Procedure Laterality Date  . ABDOMINAL HYSTERECTOMY     [place] bso  . ABDOMINAL SURGERY    . APPENDECTOMY    .  BREAST BIOPSY Bilateral 2016   negative  . 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 BREAST BIOPSY Left 05/27/2016   BENIGN MAMMARY EPITHELIUM  . BREAST BIOPSY Left 11/20/2017   affirm bx x clip BENIGN MAMMARY EPITHELIUM CONSISTENT WITH RAD THERAPY  . BREAST LUMPECTOMY Left 10/17/2015   DCIS and papillary carcinoma insitu, clear margins  . CARDIAC SURGERY     has replacement valve  . CATARACT EXTRACTION Right   . CHOLECYSTECTOMY    . COLONOSCOPY WITH PROPOFOL N/A 03/11/2016   Procedure: COLONOSCOPY WITH PROPOFOL;  Surgeon: Manya Silvas, MD;   Location: [facility] ENDOSCOPY;  Service: Endoscopy;  Laterality: N/A;  . COLONOSCOPY WITH PROPOFOL N/A 02/18/2020   Procedure: COLONOSCOPY WITH PROPOFOL;  Surgeon: [name], MD;  Location: [facility] ENDOSCOPY;  Service: Endoscopy;  Laterality: N/A;  . ESOPHAGOGASTRODUODENOSCOPY (EGD) WITH PROPOFOL N/A 03/11/2016   Procedure: ESOPHAGOGASTRODUODENOSCOPY (EGD) WITH PROPOFOL;  Surgeon: Manya Silvas, MD;  Location: [facility] ENDOSCOPY;  Service: Endoscopy;  Laterality: N/A;  . ESOPHAGOGASTRODUODENOSCOPY (EGD) WITH PROPOFOL N/A 02/18/2020   Procedure: ESOPHAGOGASTRODUODENOSCOPY (EGD) WITH PROPOFOL;  Surgeon: [name], MD;  Location: [facility] ENDOSCOPY;  Service: Endoscopy;  Laterality: N/A;  . ESOPHAGOGASTRODUODENOSCOPY (EGD) WITH PROPOFOL N/A 04/25/2020   Procedure: ESOPHAGOGASTRODUODENOSCOPY (EGD) WITH PROPOFOL;  Surgeon: [name], MD;  Location: [facility] ENDOSCOPY;  Service: Endoscopy;  Laterality: N/A;  . ESOPHAGOGASTRODUODENOSCOPY (EGD) WITH PROPOFOL N/A 05/23/2020   Procedure: ESOPHAGOGASTRODUODENOSCOPY (EGD) WITH PROPOFOL;  Surgeon: [name], MD;  Location: [facility] ENDOSCOPY;  Service: Endoscopy;  Laterality: N/A;  . FINGER ARTHROSCOPY WITH CARPOMETACARPEL ([place]) ARTHROPLASTY Right 09/03/2018   Procedure: CARPOMETACARPEL [facility]) ARTHROPLASTY RIGHT THUMB;  Surgeon: Hessie Knows, MD;  Location: [facility] ORS;  Service: Orthopedics;  Laterality: Right;  . GANGLION CYST EXCISION Right 09/03/2018   Procedure: REMOVAL GANGLION OF WRIST;  Surgeon: Hessie Knows, MD;  Location: [facility] ORS;  Service: Orthopedics;  Laterality: Right;  . HARDWARE REMOVAL Right 09/03/2018   Procedure: HARDWARE REMOVAL RIGHT THUMB;  Surgeon: Hessie Knows, MD;  Location: [facility] ORS;  Service: Orthopedics;  Laterality: Right;  staple removed  . JOINT REPLACEMENT Left    TKR  . left sinusplasty     . MASTECTOMY, PARTIAL Left 10/17/2015   Procedure: MASTECTOMY PARTIAL REVISION;  Surgeon: Leonie Green, MD;  Location: [facility] ORS;  Service: General;   Laterality: Left;  . PARTIAL MASTECTOMY WITH NEEDLE LOCALIZATION Left 09/29/2015   Procedure: PARTIAL MASTECTOMY WITH NEEDLE LOCALIZATION;  Surgeon: Leonie Green, MD;  Location: [facility] ORS;  Service: General;  Laterality: Left;  . TOTAL ABDOMINAL HYSTERECTOMY W/ BILATERAL SALPINGOOPHORECTOMY      Current Medications: Current Meds  Medication Sig  . azelastine (OPTIVAR) 0.05 % ophthalmic solution 1 drop into affected eye  . benzonatate (TESSALON) 200 MG capsule Take 200 mg by mouth 3 (three) times daily as needed for cough.  . Cholecalciferol (VITAMIN D) 50 MCG (2000 UT) CAPS Take 2,000 Units by mouth daily.  . clobetasol ointment (TEMOVATE) 9.02 % Apply 1 application topically 2 (two) times a week.  . cyanocobalamin (,VITAMIN B-12,) 1000 MCG/ML injection 1,000 mcg every 30 (thirty) days.  Marland Kitchen desloratadine (CLARINEX) 5 MG tablet Take 1 tablet by mouth daily.  Marland Kitchen dicyclomine (BENTYL) 10 MG capsule Take 10 mg by mouth at bedtime.   . docusate sodium (COLACE) 100 MG capsule Take 1 capsule by mouth 2 (two) times daily as needed.  . Dulaglutide (TRULICITY) 4.09 BD/5.3GD SOPN Inject 0.75 mg into the skin once a week.  Marland Kitchen  EPINEPHrine 0.3 mg/0.3 mL IJ SOAJ injection Inject 0.3 mg into the muscle as needed.  Marland Kitchen escitalopram (LEXAPRO) 5 MG tablet Take 5 mg by mouth at bedtime.  Marland Kitchen esomeprazole (NEXIUM) 40 MG capsule Take 40 mg by mouth 2 (two) times daily before a meal.   . estradiol (ESTRACE) 0.1 MG/GM vaginal cream Place 1 Applicatorful vaginally as needed.  . fenofibrate 160 MG tablet Take 160 mg by mouth at bedtime.  . fexofenadine (ALLEGRA) 180 MG tablet Take 180 mg by mouth daily.  . fluconazole (DIFLUCAN) 150 MG tablet Take 1 tablet (150 mg total) by mouth once a week. For three doses  . fluticasone (FLONASE) 50 MCG/ACT nasal spray Place 1 spray into the nose daily as needed for allergies.  Marland Kitchen gabapentin (NEURONTIN) 300 MG capsule Take 300 mg by mouth at bedtime.   Marland Kitchen glimepiride (AMARYL) 2 MG  tablet Take 2 mg by mouth at bedtime.  . hydrOXYzine (ATARAX/VISTARIL) 25 MG tablet 1 tablet as needed  . Hyoscyamine Sulfate 0.375 MG TBCR Take 0.375 mg by mouth 2 (two) times daily.   . Insulin Degludec (TRESIBA) 100 UNIT/ML SOLN Inject 54 Units into the skin at bedtime.  Marland Kitchen levothyroxine (SYNTHROID, LEVOTHROID) 75 MCG tablet Take 75 mcg by mouth daily before breakfast.   . magnesium oxide (MAG-OX) 400 MG tablet Take 400 mg by mouth 2 (two) times daily.   . nebivolol (BYSTOLIC) 2.5 MG tablet Take 1 tablet (2.5 mg total) by mouth daily.  Marland Kitchen NOVOLOG FLEXPEN 100 UNIT/ML FlexPen Inject 15 Units into the skin 3 (three) times daily with meals. Based on sliding scale  . ondansetron (ZOFRAN) 4 MG tablet Take 1 tablet (4 mg total) by mouth every 8 (eight) hours as needed for nausea or vomiting.  . potassium chloride SA (K-DUR,KLOR-CON) 20 MEQ tablet Take 20 mEq by mouth 2 (two) times daily.   Marland Kitchen saccharomyces boulardii (FLORASTOR) 250 MG capsule Take 250 mg by mouth 2 (two) times daily.   . simvastatin (ZOCOR) 20 MG tablet Take 20 mg by mouth at bedtime.  Marland Kitchen spironolactone (ALDACTONE) 25 MG tablet Take 1 tablet (25 mg total) by mouth daily.  Marland Kitchen torsemide (DEMADEX) 20 MG tablet Take 1 tablet (20 mg total) by mouth daily.  . valACYclovir (VALTREX) 500 MG tablet Take 500 mg by mouth See admin instructions. For fever blisters take 566m twice a day as needed     Allergies:   Codeine, Demeclocycline, Demerol [meperidine], Hydrocodone, Oxycodone, Pentazocine, Tetracyclines & related, and Fentanyl   Social History   Socioeconomic History  . Marital status: Married    Spouse name: Not on file  . Number of children: Not on file  . Years of education: Not on file  . Highest education level: Not on file  Occupational History  . Not on file  Tobacco Use  . Smoking status: Never Smoker  . Smokeless tobacco: Never Used  Vaping Use  . Vaping Use: Never used  Substance and Sexual Activity  . Alcohol use: No     Alcohol/week: 0.0 standard drinks  . Drug use: No  . Sexual activity: Not Currently    Birth control/protection: Surgical  Other Topics Concern  . Not on file  Social History Narrative  . Not on file   Social Determinants of Health   Financial Resource Strain: Not on file  Food Insecurity: Not on file  Transportation Needs: Not on file  Physical Activity: Not on file  Stress: Not on file  Social Connections: Not  on file     Family History: The patient's family history includes Bladder Cancer in her brother; Breast cancer in her paternal grandmother; Colon cancer in her father and maternal uncle; Diabetes in her brother and sister; Heart disease in her brother; Leukemia in her mother; Prostate cancer in her brother and brother. There is no history of Ovarian cancer or Kidney cancer.  ROS:   Please see the history of present illness.     All other systems reviewed and are negative.  EKGs/Labs/Other Studies Reviewed:    The following studies were reviewed today:  Heart monitor 03/2020   The patient was monitored for 14 days.  The predominant rhythm was sinus with an average rate of 55 bpm (range 42-96 bpm in sinus).  There were occasional PAC's and rare PVC's.  16 atrial runs lasting up to 12 beats occurred with a maximum rate of 164 bpm.  There was a single episode of nonsustained ventricular tachycardia lasting 6 beats with a maximum rate of 152 bpm.  No sustained arrhythmia or prolonged pause was observed.  Patient triggered events correspond to sinus rhythm, PAC's, and PVC's.   Predominantly sinus rhythm with occasional PAC's and rare PVC's, as well as rare brief episodes of PSVT and NSVT.   Echo 01/2020 1. Left ventricular ejection fraction, by estimation, is 50 to 55%. The  left ventricle has low normal function. Left ventricular endocardial  border not optimally defined to evaluate regional wall motion. There is  mild left ventricular hypertrophy. Left   ventricular diastolic parameters are indeterminate.  2. Right ventricular systolic function is mildly reduced. The right  ventricular size is normal. Mildly increased right ventricular wall  thickness. There is moderately elevated pulmonary artery systolic  pressure.  3. Left atrial size was moderately dilated.  4. Right atrial size was mildly dilated.  5. The pericardial effusion is posterior to the left ventricle.  6. The mitral valve is abnormal. Trivial mitral valve regurgitation.  7. Tricuspid valve regurgitation is mild to moderate.  8. The aortic valve is tricuspid. There is mild calcification of the  aortic valve. There is mild thickening of the aortic valve. Aortic valve  regurgitation is not visualized. Mild to moderate aortic valve  sclerosis/calcification is present, without any  evidence of aortic stenosis.  9. There is mild dilatation of the ascending aorta, measuring 36 mm.  10. The inferior vena cava is dilated in size with <50% respiratory  variability, suggesting right atrial pressure of 15 mmHg.    EKG:  EKG is  ordered today.  The ekg ordered today demonstrates SB, 49bpm, TWI anterolateral leads, no acute changes  Recent Labs: 02/07/2020: Magnesium 2.3; TSH 3.169 02/16/2020: B Natriuretic Peptide 377.8 04/07/2020: ALT 21 05/16/2020: BUN 16; Creatinine, Ser 0.86; Hemoglobin 12.0; Platelets 70; Potassium 4.4; Sodium 133  Recent Lipid Panel No results found for: CHOL, TRIG, HDL, CHOLHDL, VLDL, LDLCALC, LDLDIRECT   Physical Exam:    VS:  BP (!) 110/58 (BP Location: Left Arm, Patient Position: Sitting, Cuff Size: Normal)   Pulse (!) 49   Ht 5' 3"  (1.6 m)   Wt 165 lb (74.8 kg)   LMP  (LMP Unknown)   SpO2 96%   BMI 29.23 kg/m     Wt Readings from Last 3 Encounters:  07/13/20 165 lb (74.8 kg)  06/21/20 162 lb (73.5 kg)  06/08/20 171 lb 6.4 oz (77.7 kg)     GEN:  Well nourished, well developed in no acute distress HEENT: Normal NECK:  No JVD; No  carotid bruits LYMPHATICS: No lymphadenopathy CARDIAC:  Bradycardia, RR, no murmurs, rubs, gallops RESPIRATORY:  Clear to auscultation without rales, wheezing or rhonchi  ABDOMEN: Soft, non-tender, non-distended MUSCULOSKELETAL:  No edema; No deformity  SKIN: Warm and dry NEUROLOGIC:  Alert and oriented x 3 PSYCHIATRIC:  Normal affect   ASSESSMENT:    1. Atrial fibrillation with RVR ([place])   2. Gastrointestinal hemorrhage, unspecified gastrointestinal hemorrhage type   3. Esophageal varices without bleeding, unspecified esophageal varices type ([place])   4. Chronic diastolic heart failure ([place])   5. Pancytopenia ([place])   6. NASH (nonalcoholic steatohepatitis)   7. Cirrhosis of liver with ascites, unspecified hepatic cirrhosis type ([facility])    PLAN:    In order of problems listed above:  Paroxysmal Afib Today patient is is SB with heart rate 49bpm. Patient reports heart rates normally around 60bpm. Patient denies dizziness and lightheadedness. No further afib episodes since the last visit. She is now following with Dr. Quentin Ore for Afib with consideration of the Watchman, however she will need short term anticoagulation for the procedure. She will see GI doctor end of June to discuss short term anticoagulation. She was previously on eliquis and had GIB and recent esophageal banding. She is high risk for anticoagulation with GIB as well as with history of thrombocytopneia, most recent plt 70. She will follow-up with Dr. Quentin Ore after GI appointment to further discuss watchman. Heart rate low as above, but as patient is asymptomatic we will continue current dose, also seem to be controlling afib/palpitaitons. She will continue to monitor heart rate.   Chronic HFrEF Patient is euvolemic on exam. Weights stable. Continue Torsemide 45m daily, spironolactone 201EOdaily, Bysolic 27.1QRdaily.   Cirrhosis/GIB/Esophageal varices Patient is s/p esophageal recent esophageal banding. GI doctor felt she  would not require further banding. Patient denies further bleeding. Labs 4/5 showed Hgb 12.0. She plans on seeing GI end of June to discuss short term anticoagulation for the Watchman device.   Disposition: Follow up in 3 month(s) with MD/APP; Dr. LQuentin Orepost GI visit    Signed, Breeanne Oblinger HNinfa Meeker PA-C  07/13/2020 3:03 PM    [facility]

## 2020-07-17 ENCOUNTER — Ambulatory Visit: Payer: Medicare PPO | Admitting: Medical

## 2020-08-03 ENCOUNTER — Ambulatory Visit: Payer: Medicare PPO | Admitting: Podiatry

## 2020-08-07 ENCOUNTER — Encounter: Payer: Self-pay | Admitting: Gastroenterology

## 2020-08-07 ENCOUNTER — Other Ambulatory Visit: Payer: Self-pay

## 2020-08-07 ENCOUNTER — Ambulatory Visit (INDEPENDENT_AMBULATORY_CARE_PROVIDER_SITE_OTHER): Payer: Medicare PPO | Admitting: Gastroenterology

## 2020-08-07 VITALS — BP 130/72 | HR 66 | Temp 97.6°F | Ht 63.0 in | Wt 166.6 lb

## 2020-08-07 DIAGNOSIS — K7460 Unspecified cirrhosis of liver: Secondary | ICD-10-CM | POA: Diagnosis not present

## 2020-08-07 DIAGNOSIS — R188 Other ascites: Secondary | ICD-10-CM | POA: Diagnosis not present

## 2020-08-07 NOTE — Progress Notes (Signed)
Primary Care Physician: Albina Billet, MD  Primary Gastroenterologist:  Dr. [name]  Chief Complaint  Patient presents with  . Follow up Cirrhosis    HPI: Annette Foster is a 80 y.o. female here for follow-up with a history of Annette Foster cirrhosis.  The patient has had esophageal banding and ablation of her esophageal varices.  The patient is felt to be a good candidate for a watchman procedure for her A. fib but the patient has been hesitant to start anticoagulation with her history of varices.  The patient was sent back to me to discuss the short-term use of anticoagulation so that this patient can have the watchman procedure.  The patient reports that she has urgency and diarrhea every 4 to 5 days.  She is on fiber and states that she takes it twice a day.  On the other days she is not having any urgency or diarrhea.  She sometimes cannot make it to the bathroom and messes herself.  She does report that she had a today and on questioning it turns out that she had buttermilk for dinner yesterday.  Past Medical History:  Diagnosis Date  . Abdominal pain   . Allergy   . Cancer [facility]) 2017   breast cancer- Left  . Cataract   . CHF (congestive heart failure) ([place])   . Collagenous colitis   . Diabetes mellitus without complication ([place])   . Diarrhea   . Diverticulosis   . Fatty liver disease, nonalcoholic   . Fibrocystic breast   . GERD (gastroesophageal reflux disease)   . Heart murmur   . Hyperlipidemia   . Hypertension   . Hypothyroidism   . IBS (irritable bowel syndrome)   . IDA (iron deficiency anemia)   . Personal history of radiation therapy 2017   LEFT BREAST CA  . PONV (postoperative nausea and vomiting)   . Sleep apnea    C-Pap    Current Outpatient Medications  Medication Sig Dispense Refill  . azelastine (OPTIVAR) 0.05 % ophthalmic solution 1 drop into affected eye    . benzonatate (TESSALON) 200 MG capsule Take 200 mg by mouth 3 (three) times daily as  needed for cough.    . Cholecalciferol (VITAMIN D) 50 MCG (2000 UT) CAPS Take 2,000 Units by mouth daily.    . clobetasol ointment (TEMOVATE) 2.94 % Apply 1 application topically 2 (two) times a week. 45 g 1  . cyanocobalamin (,VITAMIN B-12,) 1000 MCG/ML injection 1,000 mcg every 30 (thirty) days.    Marland Kitchen desloratadine (CLARINEX) 5 MG tablet Take 1 tablet by mouth daily.    Marland Kitchen dicyclomine (BENTYL) 10 MG capsule Take 10 mg by mouth at bedtime.     . docusate sodium (COLACE) 100 MG capsule Take 1 capsule by mouth 2 (two) times daily as needed.    . Dulaglutide (TRULICITY) 7.65 YY/5.0PT SOPN Inject 0.75 mg into the skin once a week.    Marland Kitchen EPINEPHrine 0.3 mg/0.3 mL IJ SOAJ injection Inject 0.3 mg into the muscle as needed.    Marland Kitchen escitalopram (LEXAPRO) 5 MG tablet Take 5 mg by mouth at bedtime.    Marland Kitchen esomeprazole (NEXIUM) 40 MG capsule Take 40 mg by mouth 2 (two) times daily before a meal.     . estradiol (ESTRACE) 0.1 MG/GM vaginal cream Place 1 Applicatorful vaginally as needed.    . fenofibrate 160 MG tablet Take 160 mg by mouth at bedtime.    . fexofenadine (ALLEGRA) 180 MG tablet  Take 180 mg by mouth daily.    . fluconazole (DIFLUCAN) 150 MG tablet Take 1 tablet (150 mg total) by mouth once a week. For three doses 8 tablet 0  . fluticasone (FLONASE) 50 MCG/ACT nasal spray Place 1 spray into the nose daily as needed for allergies.    Marland Kitchen gabapentin (NEURONTIN) 300 MG capsule Take 300 mg by mouth at bedtime.     Marland Kitchen glimepiride (AMARYL) 2 MG tablet Take 2 mg by mouth at bedtime.    . hydrOXYzine (ATARAX/VISTARIL) 25 MG tablet 1 tablet as needed    . Hyoscyamine Sulfate 0.375 MG TBCR Take 0.375 mg by mouth 2 (two) times daily.     . Insulin Degludec (TRESIBA) 100 UNIT/ML SOLN Inject 54 Units into the skin at bedtime.    Marland Kitchen levothyroxine (SYNTHROID, LEVOTHROID) 75 MCG tablet Take 75 mcg by mouth daily before breakfast.     . magnesium oxide (MAG-OX) 400 MG tablet Take 400 mg by mouth 2 (two) times daily.      . nebivolol (BYSTOLIC) 2.5 MG tablet Take 1 tablet (2.5 mg total) by mouth daily. 90 tablet 3  . NOVOLOG FLEXPEN 100 UNIT/ML FlexPen Inject 15 Units into the skin 3 (three) times daily with meals. Based on sliding scale    . ondansetron (ZOFRAN) 4 MG tablet Take 1 tablet (4 mg total) by mouth every 8 (eight) hours as needed for nausea or vomiting. 20 tablet 3  . potassium chloride SA (K-DUR,KLOR-CON) 20 MEQ tablet Take 20 mEq by mouth 2 (two) times daily.     Marland Kitchen saccharomyces boulardii (FLORASTOR) 250 MG capsule Take 250 mg by mouth 2 (two) times daily.     . simvastatin (ZOCOR) 20 MG tablet Take 20 mg by mouth at bedtime.    Marland Kitchen spironolactone (ALDACTONE) 25 MG tablet Take 1 tablet (25 mg total) by mouth daily. 30 tablet 8  . valACYclovir (VALTREX) 500 MG tablet Take 500 mg by mouth See admin instructions. For fever blisters take 525m twice a day as needed  1  . torsemide (DEMADEX) 20 MG tablet Take 1 tablet (20 mg total) by mouth daily. 90 tablet 0   No current facility-administered medications for this visit.    Allergies as of 08/07/2020 - Review Complete 08/07/2020  Allergen Reaction Noted  . Codeine Itching and Nausea And Vomiting 06/28/2014  . Demeclocycline Rash 04/10/2012  . Demerol [meperidine] Itching and Nausea And Vomiting 06/28/2014  . Hydrocodone Itching and Nausea And Vomiting 08/24/2018  . Oxycodone Itching and Nausea And Vomiting 09/26/2015  . Pentazocine Itching and Nausea And Vomiting 04/10/2012  . Tetracyclines & related Rash 06/28/2014  . Fentanyl Itching and Nausea And Vomiting 06/28/2014    ROS:  General: Negative for anorexia, weight loss, fever, chills, fatigue, weakness. ENT: Negative for hoarseness, difficulty swallowing , nasal congestion. CV: Negative for chest pain, angina, palpitations, dyspnea on exertion, peripheral edema.  Respiratory: Negative for dyspnea at rest, dyspnea on exertion, cough, sputum, wheezing.  GI: See history of present illness. GU:   Negative for dysuria, hematuria, urinary incontinence, urinary frequency, nocturnal urination.  Endo: Negative for unusual weight change.    Physical Examination:   BP 130/72 (BP Location: Right Arm, Patient Position: Sitting, Cuff Size: Normal)   Pulse 66   Temp 97.6 F (36.4 C) (Temporal)   Ht 5' 3"  (1.6 m)   Wt 166 lb 9.6 oz (75.6 kg)   LMP  (LMP Unknown)   BMI 29.51 kg/m   General: Well-nourished, well-developed  in no acute distress.  Eyes: No icterus. Conjunctivae pink. Neuro: Alert and oriented x 3.  Grossly intact. Skin: Warm and dry, no jaundice.   Psych: Alert and cooperative, normal mood and affect.  Labs:    Imaging Studies: No results found.  Assessment and Plan:   Annette Foster is a 80 y.o. y/o female who comes in today with a history of cirrhosis.  The patient is due for a right upper quadrant ultrasound due to her cirrhosis and for evaluation for [place].  The patient also was followed by her cardiologist for possible watchman procedure for her A. fib.  The patient has been explained the risks of anticoagulation and the risks of strokes.  The patient has been told that a short course of anticoagulation so that she does not have to stay on anticoagulation indefinitely due to her A. fib would be acceptable.  The patient has a follow-up visit with her cardiologist to discuss her decision. The patient has been told to watch what she may be eating as the cause of her urgency and diarrhea that she reported happening every 4 to 5 days.  The patient will keep an eye on this and let me know.     [name], MD. Marval Regal    Note: This dictation was prepared with Dragon dictation along with smaller phrase technology. Any transcriptional errors that result from this process are unintentional.

## 2020-08-10 ENCOUNTER — Other Ambulatory Visit: Payer: Self-pay | Admitting: Medical

## 2020-08-15 NOTE — Progress Notes (Signed)
Electrophysiology Office Follow up Visit Note:    Date:  08/16/2020   ID:  Annette Foster, DOB 09/05/1940, MRN [id number]  PCP:  Albina Billet, MD  [facility] Cardiologist:  Nelva Bush, MD  [facility] Electrophysiologist:  Vickie Epley, MD    Interval History:    Annette Foster is a 80 y.o. female who presents for a follow up visit. I last saw the patient 06/21/2020 and discussed the Watchman procedure given her history of AF and GI bleeding due to esophageal varices. She underwent banding on 05/23/2020. She saw Dr Allen Norris with GI on 08/07/2020. During that visit, AC was discussed. Based on that note, a short course of AC was thought to be acceptable given her history.  Today, she is doing well. No signs/symptoms of GI bleeding. She recently saw Dr. Allen Norris in clinic on August 07, 2020.  A short course of anticoagulation was thought to be of acceptable risk to facilitate watchman implant.  She continues to be very active.  She tells me that she cooked a big meal on July 4.  She made hot dogs, hamburgers, coleslaw and invited her husband over from the nursing home.  Sounds like they had a great time.   Past Medical History:  Diagnosis Date   Abdominal pain    Allergy    Cancer ([place]) 2017   breast cancer- Foster   Cataract    CHF (congestive heart failure) (HCC)    Collagenous colitis    Diabetes mellitus without complication (HCC)    Diarrhea    Diverticulosis    Fatty liver disease, nonalcoholic    Fibrocystic breast    GERD (gastroesophageal reflux disease)    Heart murmur    Hyperlipidemia    Hypertension    Hypothyroidism    IBS (irritable bowel syndrome)    IDA (iron deficiency anemia)    Personal history of radiation therapy 2017   Foster BREAST CA   PONV (postoperative nausea and vomiting)    Sleep apnea    C-Pap    Past Surgical History:  Procedure Laterality Date   ABDOMINAL HYSTERECTOMY     [place] bso   ABDOMINAL SURGERY     APPENDECTOMY      BREAST BIOPSY Bilateral 2016   negative   BREAST BIOPSY Foster 09/11/2015   DCIS, papillary carcinoma in situ   BREAST BIOPSY Foster 05/27/2016   BENIGN MAMMARY EPITHELIUM   BREAST BIOPSY Foster 11/20/2017   affirm bx x clip BENIGN MAMMARY EPITHELIUM CONSISTENT WITH RAD THERAPY   BREAST LUMPECTOMY Foster 10/17/2015   DCIS and papillary carcinoma insitu, clear margins   CARDIAC SURGERY     has replacement valve   CATARACT EXTRACTION Right    CHOLECYSTECTOMY     COLONOSCOPY WITH PROPOFOL N/A 03/11/2016   Procedure: COLONOSCOPY WITH PROPOFOL;  Surgeon: Manya Silvas, MD;  Location: [facility] ENDOSCOPY;  Service: Endoscopy;  Laterality: N/A;   COLONOSCOPY WITH PROPOFOL N/A 02/18/2020   Procedure: COLONOSCOPY WITH PROPOFOL;  Surgeon: [name], MD;  Location: [facility] ENDOSCOPY;  Service: Endoscopy;  Laterality: N/A;   ESOPHAGOGASTRODUODENOSCOPY (EGD) WITH PROPOFOL N/A 03/11/2016   Procedure: ESOPHAGOGASTRODUODENOSCOPY (EGD) WITH PROPOFOL;  Surgeon: Manya Silvas, MD;  Location: [facility] ENDOSCOPY;  Service: Endoscopy;  Laterality: N/A;   ESOPHAGOGASTRODUODENOSCOPY (EGD) WITH PROPOFOL N/A 02/18/2020   Procedure: ESOPHAGOGASTRODUODENOSCOPY (EGD) WITH PROPOFOL;  Surgeon: [name], MD;  Location: [facility] ENDOSCOPY;  Service: Endoscopy;  Laterality: N/A;   ESOPHAGOGASTRODUODENOSCOPY (EGD) WITH PROPOFOL N/A 04/25/2020  Procedure: ESOPHAGOGASTRODUODENOSCOPY (EGD) WITH PROPOFOL;  Surgeon: [name], MD;  Location: [facility] ENDOSCOPY;  Service: Endoscopy;  Laterality: N/A;   ESOPHAGOGASTRODUODENOSCOPY (EGD) WITH PROPOFOL N/A 05/23/2020   Procedure: ESOPHAGOGASTRODUODENOSCOPY (EGD) WITH PROPOFOL;  Surgeon: [name], MD;  Location: [facility] ENDOSCOPY;  Service: Endoscopy;  Laterality: N/A;   FINGER ARTHROSCOPY WITH CARPOMETACARPEL ([place]) ARTHROPLASTY Right 09/03/2018   Procedure: CARPOMETACARPEL [facility]) ARTHROPLASTY RIGHT THUMB;  Surgeon: Hessie Knows, MD;  Location: [facility] ORS;  Service: Orthopedics;  Laterality: Right;    GANGLION CYST EXCISION Right 09/03/2018   Procedure: REMOVAL GANGLION OF WRIST;  Surgeon: Hessie Knows, MD;  Location: [facility] ORS;  Service: Orthopedics;  Laterality: Right;   HARDWARE REMOVAL Right 09/03/2018   Procedure: HARDWARE REMOVAL RIGHT THUMB;  Surgeon: Hessie Knows, MD;  Location: [facility] ORS;  Service: Orthopedics;  Laterality: Right;  staple removed   JOINT REPLACEMENT Foster    TKR   Foster sinusplasty      MASTECTOMY, PARTIAL Foster 10/17/2015   Procedure: MASTECTOMY PARTIAL REVISION;  Surgeon: Leonie Green, MD;  Location: [facility] ORS;  Service: General;  Laterality: [place];   PARTIAL MASTECTOMY WITH NEEDLE LOCALIZATION Foster 09/29/2015   Procedure: PARTIAL MASTECTOMY WITH NEEDLE LOCALIZATION;  Surgeon: Leonie Green, MD;  Location: [facility] ORS;  Service: General;  Laterality: [place];   TOTAL ABDOMINAL HYSTERECTOMY W/ BILATERAL SALPINGOOPHORECTOMY      Current Medications: Current Meds  Medication Sig   azelastine (OPTIVAR) 0.05 % ophthalmic solution 1 drop into affected eye   benzonatate (TESSALON) 200 MG capsule Take 200 mg by mouth 3 (three) times daily as needed for cough.   Cholecalciferol (VITAMIN D) 50 MCG (2000 UT) CAPS Take 2,000 Units by mouth daily.   clobetasol ointment (TEMOVATE) 8.76 % Apply 1 application topically 2 (two) times a week.   cyanocobalamin (,VITAMIN B-12,) 1000 MCG/ML injection 1,000 mcg every 30 (thirty) days.   desloratadine (CLARINEX) 5 MG tablet Take 1 tablet by mouth daily.   dicyclomine (BENTYL) 10 MG capsule Take 10 mg by mouth at bedtime.    docusate sodium (COLACE) 100 MG capsule Take 1 capsule by mouth 2 (two) times daily as needed.   Dulaglutide (TRULICITY) 8.11 XB/2.6OM SOPN Inject 0.75 mg into the skin once a week.   EPINEPHrine 0.3 mg/0.3 mL IJ SOAJ injection Inject 0.3 mg into the muscle as needed.   escitalopram (LEXAPRO) 5 MG tablet Take 5 mg by mouth at bedtime.   esomeprazole (NEXIUM) 40 MG capsule Take 40 mg by mouth 2 (two) times daily  before a meal.    estradiol (ESTRACE) 0.1 MG/GM vaginal cream Place 1 Applicatorful vaginally as needed.   fenofibrate 160 MG tablet Take 160 mg by mouth at bedtime.   fexofenadine (ALLEGRA) 180 MG tablet Take 180 mg by mouth daily.   fluconazole (DIFLUCAN) 150 MG tablet Take 1 tablet (150 mg total) by mouth once a week. For three doses   fluticasone (FLONASE) 50 MCG/ACT nasal spray Place 1 spray into the nose daily as needed for allergies.   gabapentin (NEURONTIN) 300 MG capsule Take 300 mg by mouth at bedtime.    glimepiride (AMARYL) 2 MG tablet Take 2 mg by mouth at bedtime.   hydrOXYzine (ATARAX/VISTARIL) 25 MG tablet 1 tablet as needed   Hyoscyamine Sulfate 0.375 MG TBCR Take 0.375 mg by mouth 2 (two) times daily.    Insulin Degludec (TRESIBA) 100 UNIT/ML SOLN Inject 54 Units into the skin at bedtime.   levothyroxine (SYNTHROID, LEVOTHROID) 75 MCG tablet Take 75 mcg by  mouth daily before breakfast.    magnesium oxide (MAG-OX) 400 MG tablet Take 400 mg by mouth 2 (two) times daily.    nebivolol (BYSTOLIC) 2.5 MG tablet Take 1 tablet (2.5 mg total) by mouth daily.   NOVOLOG FLEXPEN 100 UNIT/ML FlexPen Inject 15 Units into the skin 3 (three) times daily with meals. Based on sliding scale   ondansetron (ZOFRAN) 4 MG tablet Take 1 tablet (4 mg total) by mouth every 8 (eight) hours as needed for nausea or vomiting.   potassium chloride SA (K-DUR,KLOR-CON) 20 MEQ tablet Take 20 mEq by mouth 2 (two) times daily.    saccharomyces boulardii (FLORASTOR) 250 MG capsule Take 250 mg by mouth 2 (two) times daily.    simvastatin (ZOCOR) 20 MG tablet Take 20 mg by mouth at bedtime.   spironolactone (ALDACTONE) 25 MG tablet Take 1 tablet (25 mg total) by mouth daily.   torsemide (DEMADEX) 20 MG tablet Take 1 tablet (20 mg total) by mouth daily.   valACYclovir (VALTREX) 500 MG tablet Take 500 mg by mouth See admin instructions. For fever blisters take 523m twice a day as needed     Allergies:   Codeine,  Demeclocycline, Demerol [meperidine], Hydrocodone, Oxycodone, Pentazocine, Tetracyclines & related, and Fentanyl   Social History   Socioeconomic History   Marital status: Married    Spouse name: Not on file   Number of children: Not on file   Years of education: Not on file   Highest education level: Not on file  Occupational History   Not on file  Tobacco Use   Smoking status: Never   Smokeless tobacco: Never  Vaping Use   Vaping Use: Never used  Substance and Sexual Activity   Alcohol use: No    Alcohol/week: 0.0 standard drinks   Drug use: No   Sexual activity: Not Currently    Birth control/protection: Surgical  Other Topics Concern   Not on file  Social History Narrative   Not on file   Social Determinants of Health   Financial Resource Strain: Not on file  Food Insecurity: Not on file  Transportation Needs: Not on file  Physical Activity: Not on file  Stress: Not on file  Social Connections: Not on file     Family History: The patient's family history includes Bladder Cancer in her brother; Breast cancer in her paternal grandmother; Colon cancer in her father and maternal uncle; Diabetes in her brother and sister; Heart disease in her brother; Leukemia in her mother; Prostate cancer in her brother and brother. There is no history of Ovarian cancer or Kidney cancer.  ROS:   Please see the history of present illness.    All other systems reviewed and are negative.  EKGs/Labs/Other Studies Reviewed:    The following studies were reviewed today:  GI office notes, EGD procedure report    Recent Labs: 02/07/2020: Magnesium 2.3; TSH 3.169 02/16/2020: B Natriuretic Peptide 377.8 04/07/2020: ALT 21 05/16/2020: BUN 16; Creatinine, Ser 0.86; Hemoglobin 12.0; Platelets 70; Potassium 4.4; Sodium 133  Recent Lipid Panel No results found for: CHOL, TRIG, HDL, CHOLHDL, VLDL, LDLCALC, LDLDIRECT  Physical Exam:    VS:  BP 140/60 (BP Location: Foster Arm, Patient Position:  Sitting, Cuff Size: Normal)   Pulse (!) 56   Ht 5' 3"  (1.6 m)   Wt 167 lb 12.8 oz (76.1 kg)   LMP  (LMP Unknown)   SpO2 96%   BMI 29.72 kg/m     Wt Readings from Last  3 Encounters:  08/16/20 167 lb 12.8 oz (76.1 kg)  08/07/20 166 lb 9.6 oz (75.6 kg)  07/13/20 165 lb (74.8 kg)     GEN:  Well nourished, well developed in no acute distress HEENT: Normal NECK: No JVD; No carotid bruits LYMPHATICS: No lymphadenopathy CARDIAC: RRR, no murmurs, rubs, gallops RESPIRATORY:  Clear to auscultation without rales, wheezing or rhonchi  ABDOMEN: Soft, non-tender, non-distended MUSCULOSKELETAL:  No edema; No deformity  SKIN: Warm and dry NEUROLOGIC:  Alert and oriented x 3 PSYCHIATRIC:  Normal affect   ASSESSMENT:    1. Atrial fibrillation with RVR ([place])   2. Gastrointestinal hemorrhage, unspecified gastrointestinal hemorrhage type   3. Esophageal varices without bleeding, unspecified esophageal varices type ([place])   4. Chronic diastolic heart failure ([place])   5. NASH (nonalcoholic steatohepatitis)    PLAN:    In order of problems listed above:  1. Atrial fibrillation with RVR ([place]) 2. Gastrointestinal hemorrhage, unspecified gastrointestinal hemorrhage type 3. Esophageal varices without bleeding, unspecified esophageal varices type ([place])  Patient has history of atrial fibrillation not currently on anticoagulation given history of esophageal varices with bleeding.  She is undergone multiple esophageal varices banding procedures and has no current evidence of GI bleeding.  She is recently seen her GI physician, Dr. Allen Norris, who thinks a short course of blood thinner would be reasonable around the time of watchman implant.  I have reached out to him again and have confirmed it is reasonable to proceed with watchman implant and TEE given her history of esophageal varices.    We will proceed with getting her scheduled for the implant.    She will need to start blood thinner 4 weeks prior to  [facility] implant.  I will plan to use Eliquis 2.5 mg by mouth twice daily before the procedure.  We will add aspirin 81 mg by mouth once daily on the day of implant.  She will continue on this regimen for 45 days after implant.  I would plan on using CT to evaluate placement of the device at the 45-day mark.  If the device is well-seated at that check, would plan to discontinue Eliquis and aspirin and transition to Plavix 75 mg by mouth once daily monotherapy.    ------------  I have seen Annette Foster in the office today who is being considered for a Watchman Foster atrial appendage closure device. I believe they will benefit from this procedure given their history of atrial fibrillation, CHA2DS2-VASc score of at least 6 and unadjusted ischemic stroke rate of 9.7% per year. Unfortunately, the patient is not felt to be a long term anticoagulation candidate secondary to history of GI bleeding secondary to esophageal varices. The patient's chart has been reviewed and I feel that they would be a candidate for short term oral anticoagulation after Watchman implant.   It is my belief that after undergoing a LAA closure procedure, Annette Foster will not need long term anticoagulation which eliminates anticoagulation side effects and major bleeding risk.   Procedural risks for the Watchman implant have been reviewed with the patient including a 0.5% risk of stroke, <1% risk of perforation and <1% risk of device embolization.    The published clinical data on the safety and effectiveness of WATCHMAN include but are not limited to the following: - Holmes DR, Mechele Claude, Sick P et al. for the PROTECT AF Investigators. Percutaneous closure of the Foster atrial appendage versus warfarin therapy for prevention of stroke in patients with atrial  fibrillation: a randomised non-inferiority trial. Lancet 2009; 374: 534-42. Mechele Claude, Doshi SK, Abelardo Diesel D et al. on behalf of the PROTECT AF Investigators.  Percutaneous Foster Atrial Appendage Closure for Stroke Prophylaxis in Patients With Atrial Fibrillation 2.3-Year Follow-up of the PROTECT AF (Watchman Foster Atrial Appendage System for Embolic Protection in Patients With Atrial Fibrillation) Trial. Circulation 2013; 127:720-729. - Alli O, Doshi S,  Kar S, Reddy VY, Sievert H et al. Quality of Life Assessment in the Randomized PROTECT AF (Percutaneous Closure of the Foster Atrial Appendage Versus Warfarin Therapy for Prevention of Stroke in Patients With Atrial Fibrillation) Trial of Patients at Risk for Stroke With Nonvalvular Atrial Fibrillation. J Am Coll Cardiol 2013; 91:7915-0. Vertell Limber DR, Tarri Abernethy, Price M, Bowler, Sievert H, Doshi S, Huber K, Reddy V. Prospective randomized evaluation of the Watchman Foster atrial appendage Device in patients with atrial fibrillation versus long-term warfarin therapy; the PREVAIL trial. Journal of the [employer] of Cardiology, Vol. 4, No. 1, 2014, 1-11. - Kar S, Doshi SK, Sadhu A, Horton R, Osorio J et al. Primary outcome evaluation of a next-generation Foster atrial appendage closure device: results from the PINNACLE FLX trial. Circulation 2021;143(18)1754-1762.    After today's visit with the patient which was dedicated solely for shared decision making visit regarding LAA closure device, the patient decided to proceed with the LAA appendage closure procedure scheduled to be done in the near future at [facility]. Prior to the procedure, I would like to obtain a gated CT scan of the chest with contrast timed for PV/LA visualization.     4. Chronic diastolic heart failure (HCC) NYHA class II.  Warm and dry.  Continue current medical therapy  5. NASH (nonalcoholic steatohepatitis) Liver ultrasound scheduled.  Followed by Dr. Allen Norris in GI.        Total time spent with patient today 45 minutes. This includes reviewing records, evaluating the patient and coordinating care.   Medication  Adjustments/Labs and Tests Ordered: Current medicines are reviewed at length with the patient today.  Concerns regarding medicines are outlined above.  No orders of the defined types were placed in this encounter.  No orders of the defined types were placed in this encounter.    Signed, Lars Mage, MD, [facility], [facility] 08/16/2020 2:57 PM    Electrophysiology [facility]

## 2020-08-16 ENCOUNTER — Ambulatory Visit (INDEPENDENT_AMBULATORY_CARE_PROVIDER_SITE_OTHER): Payer: Medicare PPO | Admitting: Cardiology

## 2020-08-16 ENCOUNTER — Encounter: Payer: Self-pay | Admitting: Cardiology

## 2020-08-16 ENCOUNTER — Other Ambulatory Visit: Payer: Self-pay

## 2020-08-16 VITALS — BP 140/60 | HR 56 | Ht 63.0 in | Wt 167.8 lb

## 2020-08-16 DIAGNOSIS — K922 Gastrointestinal hemorrhage, unspecified: Secondary | ICD-10-CM

## 2020-08-16 DIAGNOSIS — I8500 Esophageal varices without bleeding: Secondary | ICD-10-CM

## 2020-08-16 DIAGNOSIS — I4891 Unspecified atrial fibrillation: Secondary | ICD-10-CM

## 2020-08-16 DIAGNOSIS — I5032 Chronic diastolic (congestive) heart failure: Secondary | ICD-10-CM

## 2020-08-16 DIAGNOSIS — K7581 Nonalcoholic steatohepatitis (NASH): Secondary | ICD-10-CM

## 2020-08-16 NOTE — Patient Instructions (Signed)
Medication Instructions:  Your physician recommends that you continue on your current medications as directed. Please refer to the Current Medication list given to you today. *If you need a refill on your cardiac medications before your next appointment, please call your pharmacy*  Lab Work: You will get lab work today:  BMP  If you have labs (blood work) drawn today and your tests are completely normal, you will receive your results only by: [place] (if you have MyChart) OR A paper copy in the mail If you have any lab test that is abnormal or we need to change your treatment, we will call you to review the results.  Testing/Procedures: Your physician has requested that you have cardiac CT. Cardiac computed tomography (CT) is a painless test that uses an x-ray machine to take clear, detailed pictures of your heart.    Follow-Up:  After your cardiac CT testing is complete you will be contacted with further instructions.  If you have further questions about this procedure please call [phone].  Your next office visit will be with the afib clinic prior to the Watchman implant (if indicated):  AFIB CLINIC INFORMATION: Your appointment is scheduled on: _______ at __________. Please arrive 15 minutes early for check-in. The AFib Clinic is located in the Heart and Vascular Specialty Clinics at [facility]. Parking instructions/directions: [address] (off [address]). When you pull in to Entrance C, there is an underground parking garage to your right. The code to enter the garage is _______________. Take the elevators to the first floor. Follow the signs to the Heart and Vascular Specialty Clinics. You will see registration at the end of the hallway.  Phone number: [phone]

## 2020-08-17 ENCOUNTER — Telehealth: Payer: Self-pay

## 2020-08-17 LAB — BASIC METABOLIC PANEL
BUN/Creatinine Ratio: 14 (ref 12–28)
BUN: 13 mg/dL (ref 8–27)
CO2: 25 mmol/L (ref 20–29)
Calcium: 9.4 mg/dL (ref 8.7–10.3)
Chloride: 96 mmol/L (ref 96–106)
Creatinine, Ser: 0.94 mg/dL (ref 0.57–1.00)
Glucose: 414 mg/dL — ABNORMAL HIGH (ref 65–99)
Potassium: 4.5 mmol/L (ref 3.5–5.2)
Sodium: 135 mmol/L (ref 134–144)
eGFR: 62 mL/min/{1.73_m2} (ref >59)

## 2020-08-17 NOTE — Telephone Encounter (Signed)
Per Dr. Quentin Ore, called patient to update.  Dr. Quentin Ore spoke with GI who confirmed a TEE will be fine. The patient needs to see PCP ASAP to get BS under control - Watchman cannot be placed with BS levels this high due to risk of infection. OK to continue with CT scan in the meantime. She will start Eliquis 2.5 mg BID 4 weeks prior to implant.  The patient did not answer and her VM is full - unable to leave message.  Will try again later.

## 2020-08-17 NOTE — Telephone Encounter (Signed)
-----   Message from Damian Leavell, RN sent at 08/17/2020  7:02 AM EDT -----  ----- Message ----- From: Vickie Epley, MD Sent: 08/16/2020   7:21 PM EDT To: Tempie Donning, RN, Damian Leavell, RN  OK to proceed with getting her scheduled. I spoke with the GI physician who said a TEE would be fine.  She will be started on Eliquis 2.5 mg by mouth twice daily 4 weeks prior to the scheduled implant date.  Jaquelyn Bitter or Sonia Baller, can you will reach out to her to let her know I spoke to the GI physician and we are all set to go with scheduling?  Thanks,  Lysbeth Galas T. Quentin Ore, MD, [facility], [facility] Cardiac Electrophysiology

## 2020-08-17 NOTE — Telephone Encounter (Signed)
-----   Message from Vickie Epley, MD sent at 08/17/2020  8:01 AM EDT ----- Annette Lex, [name]' blood sugars are extremely elevated. She needs to see her primary care physician ASAP to get these under control. We cannot proceed with implant of Watchman with glucose levels in this range.  OK to continue with plan to get CT scan.  Annette Galas T. Quentin Ore, MD, [facility], [place] Cardiac Electrophysiology

## 2020-08-18 NOTE — Telephone Encounter (Signed)
The patient did not answer and her VM is full - unable to leave message.   Will try again later.

## 2020-08-21 NOTE — Telephone Encounter (Signed)
The patient did not answer and her VM is full - unable to leave message.   Will try again later.

## 2020-08-24 ENCOUNTER — Ambulatory Visit
Admission: RE | Admit: 2020-08-24 | Discharge: 2020-08-24 | Disposition: A | Discharge status: Home / Self Care | Payer: Medicare PPO | Source: Ambulatory Visit | Attending: Gastroenterology | Admitting: Gastroenterology

## 2020-08-24 ENCOUNTER — Other Ambulatory Visit: Payer: Self-pay

## 2020-08-24 DIAGNOSIS — R188 Other ascites: Secondary | ICD-10-CM | POA: Diagnosis present

## 2020-08-24 DIAGNOSIS — K7460 Unspecified cirrhosis of liver: Secondary | ICD-10-CM | POA: Diagnosis present

## 2020-09-04 ENCOUNTER — Telehealth: Payer: Self-pay

## 2020-09-04 NOTE — Telephone Encounter (Signed)
Pt notified of results through Annette Foster.

## 2020-09-04 NOTE — Telephone Encounter (Signed)
-----   Message from [name], MD sent at 09/03/2020 10:26 AM EDT ----- Let the patient know that fluid in the abdomin has resolved. The ultrasound showed cirrhosis without any sign of abnormal growths or cancer.

## 2020-09-06 ENCOUNTER — Encounter: Payer: Medicare PPO | Admitting: Obstetrics and Gynecology

## 2020-09-06 ENCOUNTER — Telehealth (HOSPITAL_COMMUNITY): Payer: Self-pay | Admitting: Emergency Medicine

## 2020-09-06 NOTE — Telephone Encounter (Signed)
Reaching out to patient to offer assistance regarding upcoming cardiac imaging study; pt verbalizes understanding of appt date/time, parking situation and where to check in, pre-test NPO status and medications ordered, and verified current allergies; name and call back number provided for further questions should they arise Marchia Bond RN Navigator Cardiac Imaging [place] and Vascular [phone] office [phone] cell  Denies iv issues Denies claustro  Pt reports she was in afib this morning and laid down for most of the day and its now gone

## 2020-09-07 ENCOUNTER — Encounter: Payer: Medicare PPO | Admitting: Obstetrics and Gynecology

## 2020-09-08 ENCOUNTER — Other Ambulatory Visit: Payer: Self-pay

## 2020-09-08 ENCOUNTER — Ambulatory Visit (HOSPITAL_COMMUNITY)
Admission: RE | Admit: 2020-09-08 | Discharge: 2020-09-08 | Disposition: A | Discharge status: Home / Self Care | Payer: Medicare PPO | Source: Ambulatory Visit | Attending: Cardiology | Admitting: Cardiology

## 2020-09-08 DIAGNOSIS — K7581 Nonalcoholic steatohepatitis (NASH): Secondary | ICD-10-CM | POA: Diagnosis present

## 2020-09-08 DIAGNOSIS — K922 Gastrointestinal hemorrhage, unspecified: Secondary | ICD-10-CM | POA: Diagnosis present

## 2020-09-08 DIAGNOSIS — I5032 Chronic diastolic (congestive) heart failure: Secondary | ICD-10-CM | POA: Diagnosis present

## 2020-09-08 DIAGNOSIS — I4891 Unspecified atrial fibrillation: Secondary | ICD-10-CM | POA: Insufficient documentation

## 2020-09-08 DIAGNOSIS — I8500 Esophageal varices without bleeding: Secondary | ICD-10-CM

## 2020-09-08 MED ORDER — DILTIAZEM HCL 25 MG/5ML IV SOLN
10.0000 mg | INTRAVENOUS | Status: DC | PRN
  Administered 2020-09-08: 10 mg via INTRAVENOUS

## 2020-09-08 MED ORDER — METOPROLOL TARTRATE 5 MG/5ML IV SOLN: 10.0000 mg | INTRAVENOUS | Status: DC | PRN

## 2020-09-08 MED ORDER — NITROGLYCERIN 0.4 MG SL SUBL: 0.8000 mg | SUBLINGUAL_TABLET | Freq: Once | SUBLINGUAL | Status: DC

## 2020-09-08 MED ORDER — IOHEXOL 350 MG/ML SOLN
100.0000 mL | Freq: Once | INTRAVENOUS | Status: AC | PRN
  Administered 2020-09-08: 100 mL via INTRAVENOUS

## 2020-09-08 MED ORDER — METOPROLOL TARTRATE 5 MG/5ML IV SOLN
INTRAVENOUS | Status: AC
  Administered 2020-09-08: 10 mg via INTRAVENOUS
  Filled 2020-09-08: qty 20

## 2020-09-08 MED ORDER — DILTIAZEM HCL 25 MG/5ML IV SOLN
INTRAVENOUS | Status: AC
  Administered 2020-09-08: 10 mg via INTRAVENOUS
  Filled 2020-09-08: qty 5

## 2020-09-11 ENCOUNTER — Encounter: Payer: Self-pay | Admitting: Podiatry

## 2020-09-11 ENCOUNTER — Ambulatory Visit (INDEPENDENT_AMBULATORY_CARE_PROVIDER_SITE_OTHER): Payer: Medicare PPO | Admitting: Podiatry

## 2020-09-11 ENCOUNTER — Other Ambulatory Visit: Payer: Self-pay

## 2020-09-11 DIAGNOSIS — M79674 Pain in right toe(s): Secondary | ICD-10-CM

## 2020-09-11 DIAGNOSIS — M2041 Other hammer toe(s) (acquired), right foot: Secondary | ICD-10-CM

## 2020-09-11 DIAGNOSIS — M2010 Hallux valgus (acquired), unspecified foot: Secondary | ICD-10-CM

## 2020-09-11 DIAGNOSIS — M2042 Other hammer toe(s) (acquired), left foot: Secondary | ICD-10-CM

## 2020-09-11 DIAGNOSIS — B351 Tinea unguium: Secondary | ICD-10-CM

## 2020-09-11 DIAGNOSIS — M79675 Pain in left toe(s): Secondary | ICD-10-CM

## 2020-09-11 DIAGNOSIS — E1142 Type 2 diabetes mellitus with diabetic polyneuropathy: Secondary | ICD-10-CM | POA: Diagnosis not present

## 2020-09-11 NOTE — Progress Notes (Signed)
This patient returns to my office for at risk foot care.  This patient requires this care by a professional since this patient will be at risk due to having diabetes  This patient is unable to cut nails himself since the patient cannot reach his nails.These nails are painful walking and wearing shoes.  This patient presents for at risk foot care today.    General Appearance  Alert, conversant and in no acute stress.  Vascular  Dorsalis pedis and posterior tibial  pulses are palpable  bilaterally.  Capillary return is within normal limits  bilaterally. Temperature is within normal limits  bilaterally.  Neurologic  Senn-Weinstein monofilament test dimininished  bilaterally. Muscle power within normal limits bilaterally.  Nails Thick disfigured discolored nails with subungual debris  from hallux to fifth toes bilaterally. No evidence of bacterial infection or drainage bilaterally.  Orthopedic  No limitations of motion  feet .  No crepitus or effusions noted.  No bony pathology or digital deformities noted. HAV  B/L.  Hammer toes  B/L.  Midfoot DJD  B/L.  Skin  normotropic skin with no porokeratosis noted bilaterally.  No signs of infections or ulcers noted.     Onychomycosis  Pain in right toes  Pain in left toes  Consent was obtained for treatment procedures.   Mechanical debridement of nails 1-5  bilaterally performed with a nail nipper.  Filed with dremel without incident.     Return office visit    3 months                Told patient to return for periodic foot care and evaluation due to potential at risk complications.   Gardiner Barefoot DPM

## 2020-09-12 ENCOUNTER — Inpatient Hospital Stay: Payer: Medicare PPO

## 2020-09-12 ENCOUNTER — Inpatient Hospital Stay: Payer: Medicare PPO | Attending: Nurse Practitioner

## 2020-09-12 ENCOUNTER — Inpatient Hospital Stay (HOSPITAL_BASED_OUTPATIENT_CLINIC_OR_DEPARTMENT_OTHER): Payer: Medicare PPO | Admitting: Nurse Practitioner

## 2020-09-12 VITALS — BP 95/67 | HR 125 | Temp 98.0°F | Resp 18 | Wt 163.0 lb

## 2020-09-12 DIAGNOSIS — D500 Iron deficiency anemia secondary to blood loss (chronic): Secondary | ICD-10-CM | POA: Insufficient documentation

## 2020-09-12 DIAGNOSIS — K7460 Unspecified cirrhosis of liver: Secondary | ICD-10-CM | POA: Diagnosis not present

## 2020-09-12 DIAGNOSIS — Z8042 Family history of malignant neoplasm of prostate: Secondary | ICD-10-CM | POA: Insufficient documentation

## 2020-09-12 DIAGNOSIS — K922 Gastrointestinal hemorrhage, unspecified: Secondary | ICD-10-CM | POA: Diagnosis not present

## 2020-09-12 DIAGNOSIS — Z8052 Family history of malignant neoplasm of bladder: Secondary | ICD-10-CM | POA: Insufficient documentation

## 2020-09-12 DIAGNOSIS — D61818 Other pancytopenia: Secondary | ICD-10-CM | POA: Diagnosis present

## 2020-09-12 DIAGNOSIS — Z86000 Personal history of in-situ neoplasm of breast: Secondary | ICD-10-CM | POA: Insufficient documentation

## 2020-09-12 DIAGNOSIS — E611 Iron deficiency: Secondary | ICD-10-CM

## 2020-09-12 DIAGNOSIS — R002 Palpitations: Secondary | ICD-10-CM | POA: Insufficient documentation

## 2020-09-12 DIAGNOSIS — D6959 Other secondary thrombocytopenia: Secondary | ICD-10-CM | POA: Diagnosis not present

## 2020-09-12 DIAGNOSIS — Z800 Family history of malignant neoplasm of digestive organs: Secondary | ICD-10-CM | POA: Insufficient documentation

## 2020-09-12 DIAGNOSIS — I4891 Unspecified atrial fibrillation: Secondary | ICD-10-CM | POA: Diagnosis not present

## 2020-09-12 DIAGNOSIS — Z803 Family history of malignant neoplasm of breast: Secondary | ICD-10-CM | POA: Diagnosis not present

## 2020-09-12 DIAGNOSIS — Z806 Family history of leukemia: Secondary | ICD-10-CM | POA: Insufficient documentation

## 2020-09-12 DIAGNOSIS — Z794 Long term (current) use of insulin: Secondary | ICD-10-CM | POA: Diagnosis not present

## 2020-09-12 DIAGNOSIS — D0512 Intraductal carcinoma in situ of left breast: Secondary | ICD-10-CM

## 2020-09-12 DIAGNOSIS — Z923 Personal history of irradiation: Secondary | ICD-10-CM | POA: Insufficient documentation

## 2020-09-12 DIAGNOSIS — K7581 Nonalcoholic steatohepatitis (NASH): Secondary | ICD-10-CM | POA: Diagnosis not present

## 2020-09-12 DIAGNOSIS — R5383 Other fatigue: Secondary | ICD-10-CM | POA: Insufficient documentation

## 2020-09-12 LAB — CBC WITH DIFFERENTIAL/PLATELET
Abs Immature Granulocytes: 0.01 10*3/uL (ref 0.00–0.07)
Basophils Absolute: 0 10*3/uL (ref 0.0–0.1)
Basophils Relative: 1 %
Eosinophils Absolute: 0.1 10*3/uL (ref 0.0–0.5)
Eosinophils Relative: 2 %
HCT: 40.7 % (ref 36.0–46.0)
Hemoglobin: 13.5 g/dL (ref 12.0–15.0)
Immature Granulocytes: 0 %
Lymphocytes Relative: 31 %
Lymphs Abs: 0.8 10*3/uL (ref 0.7–4.0)
MCH: 30.1 pg (ref 26.0–34.0)
MCHC: 33.2 g/dL (ref 30.0–36.0)
MCV: 90.8 fL (ref 80.0–100.0)
Monocytes Absolute: 0.3 10*3/uL (ref 0.1–1.0)
Monocytes Relative: 10 %
Neutro Abs: 1.5 10*3/uL — ABNORMAL LOW (ref 1.7–7.7)
Neutrophils Relative %: 56 %
Platelets: 87 10*3/uL — ABNORMAL LOW (ref 150–400)
RBC: 4.48 MIL/uL (ref 3.87–5.11)
RDW: 13.7 % (ref 11.5–15.5)
WBC: 2.6 10*3/uL — ABNORMAL LOW (ref 4.0–10.5)
nRBC: 0 % (ref 0.0–0.2)

## 2020-09-12 LAB — COMPREHENSIVE METABOLIC PANEL
ALT: 19 U/L (ref 0–44)
AST: 29 U/L (ref 15–41)
Albumin: 4.2 g/dL (ref 3.5–5.0)
Alkaline Phosphatase: 53 U/L (ref 38–126)
Anion gap: 10 (ref 5–15)
BUN: 23 mg/dL (ref 8–23)
CO2: 31 mmol/L (ref 22–32)
Calcium: 9.3 mg/dL (ref 8.9–10.3)
Chloride: 90 mmol/L — ABNORMAL LOW (ref 98–111)
Creatinine, Ser: 0.9 mg/dL (ref 0.44–1.00)
GFR, Estimated: >60 mL/min (ref >60)
Glucose, Bld: 397 mg/dL — ABNORMAL HIGH (ref 70–99)
Potassium: 3.8 mmol/L (ref 3.5–5.1)
Sodium: 131 mmol/L — ABNORMAL LOW (ref 135–145)
Total Bilirubin: 0.9 mg/dL (ref 0.3–1.2)
Total Protein: 8.5 g/dL — ABNORMAL HIGH (ref 6.5–8.1)

## 2020-09-12 LAB — FERRITIN: Ferritin: 37 ng/mL (ref 11–307)

## 2020-09-12 LAB — IRON AND TIBC
Iron: 125 ug/dL (ref 28–170)
Saturation Ratios: 25 % (ref 10.4–31.8)
TIBC: 508 ug/dL — ABNORMAL HIGH (ref 250–450)
UIBC: 383 ug/dL

## 2020-09-12 NOTE — Progress Notes (Signed)
[facility] OFFICE PROGRESS NOTE  Patient Care Team: Albina Billet, MD as PCP - General (Internal Medicine) End, Harrell Gave, MD as PCP - Cardiology (Cardiology) Vickie Epley, MD as PCP - Electrophysiology (Cardiology)  Cancer Staging No matching staging information was found for the patient.   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 OCT 2018- BMBx- MILD dyspoiesis; variable cellularity [10-50%]; FISH/Cytogenetics-NORMAL; F-One-NGS-declined by insurance.    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 Brandon/uro-gyne,[facility]]  # HRT [clinical trial thru [facility]; stopped July 2017 ]; CPAP   DIAGNOSIS: Left breast DCIS  STAGE:    0     ;GOALS: cure  CURRENT/MOST RECENT THERAPY: surveillaince    Ductal carcinoma in situ (DCIS) of left breast    INTERVAL HISTORY: Annette Foster 80 y.o. female pleasant patient with above history of DCIS, cirrhosis, portal hypertension, A. fib, pancytopenia, who returns to clinic for follow-up.  She is currently being evaluated for placement of a watchman device by cardiology.  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  No chest pain, shortness of breath.  Has intermittent palpitations.  No syncopal episodes.  Has mild fatigue.  No melena or hematochezia.  She continues to be the caregiver for her sister-in-law.  Her husband has dementia and lives in a facility and she visits him regularly.   Review of Systems  Constitutional:  Positive for malaise/fatigue. Negative for chills, fever and weight loss.  HENT:  Negative for hearing loss, nosebleeds, sore throat and tinnitus.    Eyes:  Negative for blurred vision and double vision.  Respiratory:  Negative for cough, hemoptysis, shortness of breath and wheezing.   Cardiovascular:  Positive for palpitations. Negative for chest pain and leg swelling.  Gastrointestinal:  Negative for abdominal pain, blood in stool, constipation, diarrhea, melena, nausea and vomiting.  Genitourinary:  Negative for dysuria and urgency.  Musculoskeletal:  Negative for back pain, falls, joint pain and myalgias.  Skin:  Negative for itching and rash.  Neurological:  Negative for dizziness, tingling, sensory change, loss of consciousness, weakness and headaches.  Endo/Heme/Allergies:  Negative for environmental allergies. Does not bruise/bleed easily.  Psychiatric/Behavioral:  Negative for depression. The patient is not nervous/anxious and does not have insomnia.      PAST MEDICAL HISTORY :  Past Medical History:  Diagnosis Date   Abdominal pain    Allergy    Cancer ([place]) 2017   breast cancer- Left   Cataract    CHF (congestive heart failure) (HCC)    Collagenous colitis    Diabetes mellitus without complication (HCC)    Diarrhea    Diverticulosis    Fatty liver disease, nonalcoholic    Fibrocystic breast    GERD (gastroesophageal reflux disease)    Heart murmur    Hyperlipidemia    Hypertension    Hypothyroidism    IBS (irritable bowel syndrome)    IDA (iron deficiency anemia)    Personal history of radiation therapy 2017   LEFT BREAST CA   PONV (postoperative nausea and vomiting)    Sleep apnea    C-Pap    PAST SURGICAL HISTORY :   Past Surgical History:  Procedure Laterality Date   ABDOMINAL HYSTERECTOMY  tah bso   ABDOMINAL SURGERY     APPENDECTOMY     BREAST BIOPSY Bilateral 2016   negative   BREAST 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/2015   DCIS, papillary carcinoma in situ   BREAST BIOPSY Left 05/27/2016   BENIGN MAMMARY EPITHELIUM   BREAST BIOPSY Left 11/20/2017   affirm bx x clip BENIGN MAMMARY EPITHELIUM CONSISTENT  WITH RAD THERAPY   BREAST LUMPECTOMY Left 10/17/2015   DCIS and papillary carcinoma insitu, clear margins   CARDIAC SURGERY     has replacement valve   CATARACT EXTRACTION Right    CHOLECYSTECTOMY     COLONOSCOPY WITH PROPOFOL N/A 03/11/2016   Procedure: COLONOSCOPY WITH PROPOFOL;  Surgeon: Manya Silvas, MD;  Location: [facility] ENDOSCOPY;  Service: Endoscopy;  Laterality: N/A;   COLONOSCOPY WITH PROPOFOL N/A 02/18/2020   Procedure: COLONOSCOPY WITH PROPOFOL;  Surgeon: [name], MD;  Location: [facility] ENDOSCOPY;  Service: Endoscopy;  Laterality: N/A;   ESOPHAGOGASTRODUODENOSCOPY (EGD) WITH PROPOFOL N/A 03/11/2016   Procedure: ESOPHAGOGASTRODUODENOSCOPY (EGD) WITH PROPOFOL;  Surgeon: Manya Silvas, MD;  Location: [facility] ENDOSCOPY;  Service: Endoscopy;  Laterality: N/A;   ESOPHAGOGASTRODUODENOSCOPY (EGD) WITH PROPOFOL N/A 02/18/2020   Procedure: ESOPHAGOGASTRODUODENOSCOPY (EGD) WITH PROPOFOL;  Surgeon: [name], MD;  Location: [facility] ENDOSCOPY;  Service: Endoscopy;  Laterality: N/A;   ESOPHAGOGASTRODUODENOSCOPY (EGD) WITH PROPOFOL N/A 04/25/2020   Procedure: ESOPHAGOGASTRODUODENOSCOPY (EGD) WITH PROPOFOL;  Surgeon: [name], MD;  Location: [facility] ENDOSCOPY;  Service: Endoscopy;  Laterality: N/A;   ESOPHAGOGASTRODUODENOSCOPY (EGD) WITH PROPOFOL N/A 05/23/2020   Procedure: ESOPHAGOGASTRODUODENOSCOPY (EGD) WITH PROPOFOL;  Surgeon: [name], MD;  Location: [facility] ENDOSCOPY;  Service: Endoscopy;  Laterality: N/A;   FINGER ARTHROSCOPY WITH CARPOMETACARPEL ([place]) ARTHROPLASTY Right 09/03/2018   Procedure: CARPOMETACARPEL [facility]) ARTHROPLASTY RIGHT THUMB;  Surgeon: Hessie Knows, MD;  Location: [facility] ORS;  Service: Orthopedics;  Laterality: Right;   GANGLION CYST EXCISION Right 09/03/2018   Procedure: REMOVAL GANGLION OF WRIST;  Surgeon: Hessie Knows, MD;  Location: [facility] ORS;  Service: Orthopedics;  Laterality: Right;   HARDWARE REMOVAL Right 09/03/2018   Procedure: HARDWARE REMOVAL RIGHT THUMB;  Surgeon:  Hessie Knows, MD;  Location: [facility] ORS;  Service: Orthopedics;  Laterality: Right;  staple removed   JOINT REPLACEMENT Left    TKR   left sinusplasty      MASTECTOMY, PARTIAL Left 10/17/2015   Procedure: MASTECTOMY PARTIAL REVISION;  Surgeon: Leonie Green, MD;  Location: [facility] ORS;  Service: General;  Laterality: Left;   PARTIAL MASTECTOMY WITH NEEDLE LOCALIZATION Left 09/29/2015   Procedure: PARTIAL MASTECTOMY WITH NEEDLE LOCALIZATION;  Surgeon: Leonie Green, MD;  Location: [facility] ORS;  Service: General;  Laterality: Left;   TOTAL ABDOMINAL HYSTERECTOMY W/ BILATERAL SALPINGOOPHORECTOMY      FAMILY HISTORY :   Family History  Problem Relation Age of Onset   Breast cancer Paternal Grandmother    Colon cancer Father    Diabetes Sister    Diabetes Brother    Heart disease Brother    Prostate cancer Brother    Colon cancer Maternal Uncle    Prostate cancer Brother    Bladder Cancer Brother    Leukemia Mother        all   Ovarian cancer Neg Hx    Kidney cancer Neg Hx     SOCIAL HISTORY:   Social History   Tobacco Use   Smoking status: Never   Smokeless tobacco: Never  Vaping Use   Vaping Use: Never used  Substance Use Topics   Alcohol use: No  Alcohol/week: 0.0 standard drinks   Drug use: No    ALLERGIES:  is allergic to codeine, demeclocycline, demerol [meperidine], hydrocodone, oxycodone, pentazocine, tetracyclines & related, and fentanyl.  MEDICATIONS:  Current Outpatient Medications  Medication Sig Dispense Refill   azelastine (OPTIVAR) 0.05 % ophthalmic solution 1 drop into affected eye     benzonatate (TESSALON) 200 MG capsule Take 200 mg by mouth 3 (three) times daily as needed for cough.     Cholecalciferol (VITAMIN D) 50 MCG (2000 UT) CAPS Take 2,000 Units by mouth daily.     clobetasol ointment (TEMOVATE) 8.65 % Apply 1 application topically 2 (two) times a week. 45 g 1   cyanocobalamin (,VITAMIN B-12,) 1000 MCG/ML injection 1,000 mcg every 30  (thirty) days.     desloratadine (CLARINEX) 5 MG tablet Take 1 tablet by mouth daily.     dicyclomine (BENTYL) 10 MG capsule Take 10 mg by mouth at bedtime.      docusate sodium (COLACE) 100 MG capsule Take 1 capsule by mouth 2 (two) times daily as needed.     Dulaglutide (TRULICITY) 7.84 ON/6.2XB SOPN Inject 0.75 mg into the skin once a week.     escitalopram (LEXAPRO) 5 MG tablet Take 5 mg by mouth at bedtime.     esomeprazole (NEXIUM) 40 MG capsule Take 40 mg by mouth 2 (two) times daily before a meal.      estradiol (ESTRACE) 0.1 MG/GM vaginal cream Place 1 Applicatorful vaginally as needed.     fenofibrate 160 MG tablet Take 160 mg by mouth at bedtime.     fexofenadine (ALLEGRA) 180 MG tablet Take 180 mg by mouth daily.     fluconazole (DIFLUCAN) 150 MG tablet Take 1 tablet (150 mg total) by mouth once a week. For three doses 8 tablet 0   fluticasone (FLONASE) 50 MCG/ACT nasal spray Place 1 spray into the nose daily as needed for allergies.     gabapentin (NEURONTIN) 300 MG capsule Take 300 mg by mouth at bedtime.      glimepiride (AMARYL) 2 MG tablet Take 2 mg by mouth at bedtime.     hydrOXYzine (ATARAX/VISTARIL) 25 MG tablet 1 tablet as needed     Hyoscyamine Sulfate 0.375 MG TBCR Take 0.375 mg by mouth 2 (two) times daily.      Insulin Degludec (TRESIBA) 100 UNIT/ML SOLN Inject 54 Units into the skin at bedtime.     levothyroxine (SYNTHROID, LEVOTHROID) 75 MCG tablet Take 75 mcg by mouth daily before breakfast.      magnesium oxide (MAG-OX) 400 MG tablet Take 400 mg by mouth 2 (two) times daily.      nebivolol (BYSTOLIC) 2.5 MG tablet Take 1 tablet (2.5 mg total) by mouth daily. 90 tablet 3   NOVOLOG FLEXPEN 100 UNIT/ML FlexPen Inject 15 Units into the skin 3 (three) times daily with meals. Based on sliding scale     ondansetron (ZOFRAN) 4 MG tablet Take 1 tablet (4 mg total) by mouth every 8 (eight) hours as needed for nausea or vomiting. 20 tablet 3   potassium chloride SA  (K-DUR,KLOR-CON) 20 MEQ tablet Take 20 mEq by mouth 2 (two) times daily.      saccharomyces boulardii (FLORASTOR) 250 MG capsule Take 250 mg by mouth 2 (two) times daily.      simvastatin (ZOCOR) 20 MG tablet Take 20 mg by mouth at bedtime.     spironolactone (ALDACTONE) 25 MG tablet Take 1 tablet (25 mg total) by mouth daily. Felida  tablet 8   torsemide (DEMADEX) 20 MG tablet Take 1 tablet (20 mg total) by mouth daily. 90 tablet 0   valACYclovir (VALTREX) 500 MG tablet Take 500 mg by mouth See admin instructions. For fever blisters take 59m twice a day as needed  1   EPINEPHrine 0.3 mg/0.3 mL IJ SOAJ injection Inject 0.3 mg into the muscle as needed. (Patient not taking: Reported on 09/12/2020)     No current facility-administered medications for this visit.    PHYSICAL EXAMINATION: ECOG PERFORMANCE STATUS: 1 - Symptomatic but completely ambulatory  BP 95/67 (BP Location: Right Arm, Patient Position: Sitting)   Pulse (!) 125   Temp 98 F (36.7 C) (Tympanic)   Resp 18   Wt 163 lb (73.9 kg)   LMP  (LMP Unknown)   SpO2 98%   BMI 28.87 kg/m   Filed Weights   09/12/20 1320  Weight: 163 lb (73.9 kg)    Physical Exam Constitutional:      Appearance: She is not ill-appearing.     Comments: Alone.  Ambulating independently.  HENT:     Head: Normocephalic and atraumatic.     Mouth/Throat:     Pharynx: No oropharyngeal exudate.  Eyes:     Conjunctiva/sclera: Conjunctivae normal.     Pupils: Pupils are equal, round, and reactive to light.  Cardiovascular:     Rate and Rhythm: Tachycardia present. Rhythm irregular.  Pulmonary:     Effort: Pulmonary effort is normal. No respiratory distress.     Breath sounds: Normal breath sounds. No wheezing.  Abdominal:     General: There is no distension.     Palpations: Abdomen is soft. There is no mass.     Tenderness: There is no abdominal tenderness. There is no guarding or rebound.     Comments: Positive for splenomegaly.  Musculoskeletal:         General: No tenderness. Normal range of motion.     Cervical back: Normal range of motion and neck supple.  Skin:    General: Skin is warm and dry.     Coloration: Skin is not pale.  Neurological:     Mental Status: She is alert and oriented to person, place, and time.     Gait: Gait normal.  Psychiatric:        Mood and Affect: Mood and affect normal.        Behavior: Behavior normal.     LABORATORY DATA:  I have reviewed the data as listed    Component Value Date/Time   NA 131 (L) 09/12/2020 1257   NA 135 08/16/2020 1540   K 3.8 09/12/2020 1257   CL 90 (L) 09/12/2020 1257   CO2 31 09/12/2020 1257   GLUCOSE 397 (H) 09/12/2020 1257   BUN 23 09/12/2020 1257   BUN 13 08/16/2020 1540   CREATININE 0.90 09/12/2020 1257   CALCIUM 9.3 09/12/2020 1257   PROT 8.5 (H) 09/12/2020 1257   ALBUMIN 4.2 09/12/2020 1257   AST 29 09/12/2020 1257   ALT 19 09/12/2020 1257   ALKPHOS 53 09/12/2020 1257   BILITOT 0.9 09/12/2020 1257   GFRNONAA >60 09/12/2020 1257   GFRAA 80 02/25/2020 1105    No results found for: SPEP, UPEP  Lab Results  Component Value Date   WBC 2.6 (L) 09/12/2020   NEUTROABS 1.5 (L) 09/12/2020   HGB 13.5 09/12/2020   HCT 40.7 09/12/2020   MCV 90.8 09/12/2020   PLT 87 (L) 09/12/2020  Chemistry      Component Value Date/Time   NA 131 (L) 09/12/2020 1257   NA 135 08/16/2020 1540   K 3.8 09/12/2020 1257   CL 90 (L) 09/12/2020 1257   CO2 31 09/12/2020 1257   BUN 23 09/12/2020 1257   BUN 13 08/16/2020 1540   CREATININE 0.90 09/12/2020 1257      Component Value Date/Time   CALCIUM 9.3 09/12/2020 1257   ALKPHOS 53 09/12/2020 1257   AST 29 09/12/2020 1257   ALT 19 09/12/2020 1257   BILITOT 0.9 09/12/2020 1257       RADIOGRAPHIC STUDIES: I have personally reviewed the radiological images as listed and agreed with the findings in the report. No results found.   ASSESSMENT & PLAN:  No problem-specific Assessment & Plan notes found for this  encounter.  1. Anemia-secondary GI bleed chronic.Marland Kitchen  Hemoglobin-13.5, improved. Iron studies pending. Hold IV iron.     2. 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  ANC-1500; Platelets 80s. Stable. Monitor.    3. Tachy-brady syndrome- Hx of A.fib [awaiting evaluation with Dr.End]-defer to cardiology regarding management.   4. 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 AFP pending today.    5. DCIS/papillary carcinoma in situ-stable no clinical evidence of recurrence. Mammogram December 2021-within normal limits. Repeat annualy with Dr. Windell Moment. Stable.  DISPOSITION: No venofer today follow up in 4 month- MD; labs- cbc, cmp, iron studies, ferritin; possible Venofer-Dr.B  No orders of the defined types were placed in this encounter.  All questions were answered. The patient knows to call the clinic with any problems, questions or concerns.      Verlon Au, NP 09/12/2020 2:21 PM

## 2020-09-13 LAB — AFP TUMOR MARKER: AFP, Serum, Tumor Marker: 3.7 ng/mL (ref 0.0–9.2)

## 2020-09-19 NOTE — Telephone Encounter (Signed)
Reviewed with Dr. Quentin Ore and [facility] team at meeting this morning.  Per Dr. Quentin Ore, will arrange clinic follow-up to discuss poor candidacy. Will route to PCP.  Left message to call back to arrange follow-up with Dr. Quentin Ore.

## 2020-09-20 NOTE — Telephone Encounter (Signed)
Left message to call back.  Will schedule follow-up with Dr. Quentin Ore and assess respiratory symptoms as result of CT.  CT results: Patchy densities at the lung bases are nonspecific. This could represent a combination of asymmetric pulmonary edema with inflammatory or post inflammatory changes. Recommend clinical correlation with regards to acute respiratory symptoms.

## 2020-09-28 NOTE — Telephone Encounter (Signed)
Per Dr. Quentin Ore, scheduled the patient to discuss CT results with him 9/21.  She was grateful for call and agrees with plan.  Due to CT results, she is not a candidate for Watchman.

## 2020-10-10 ENCOUNTER — Other Ambulatory Visit: Payer: Self-pay

## 2020-10-10 ENCOUNTER — Encounter: Payer: Self-pay | Admitting: Obstetrics and Gynecology

## 2020-10-10 ENCOUNTER — Ambulatory Visit (INDEPENDENT_AMBULATORY_CARE_PROVIDER_SITE_OTHER): Payer: Medicare PPO | Admitting: Obstetrics and Gynecology

## 2020-10-10 ENCOUNTER — Telehealth: Payer: Self-pay | Admitting: Obstetrics and Gynecology

## 2020-10-10 ENCOUNTER — Other Ambulatory Visit (HOSPITAL_COMMUNITY)
Admission: RE | Admit: 2020-10-10 | Discharge: 2020-10-10 | Disposition: A | Discharge status: Home / Self Care | Payer: Medicare PPO | Source: Ambulatory Visit | Attending: Obstetrics and Gynecology | Admitting: Obstetrics and Gynecology

## 2020-10-10 VITALS — BP 122/76 | HR 103 | Ht 63.0 in | Wt 164.4 lb

## 2020-10-10 DIAGNOSIS — R399 Unspecified symptoms and signs involving the genitourinary system: Secondary | ICD-10-CM

## 2020-10-10 DIAGNOSIS — N763 Subacute and chronic vulvitis: Secondary | ICD-10-CM

## 2020-10-10 DIAGNOSIS — B373 Candidiasis of vulva and vagina: Secondary | ICD-10-CM | POA: Diagnosis not present

## 2020-10-10 DIAGNOSIS — B3731 Acute candidiasis of vulva and vagina: Secondary | ICD-10-CM

## 2020-10-10 LAB — POCT URINALYSIS DIPSTICK
Bilirubin, UA: NEGATIVE
Blood, UA: NEGATIVE
Glucose, UA: POSITIVE — AB
Ketones, UA: NEGATIVE
Leukocytes, UA: NEGATIVE
Nitrite, UA: NEGATIVE
Protein, UA: NEGATIVE
Spec Grav, UA: 1.01 (ref 1.010–1.025)
Urobilinogen, UA: 0.2 E.U./dL
pH, UA: 6 (ref 5.0–8.0)

## 2020-10-10 MED ORDER — FLUCONAZOLE 150 MG PO TABS: 150.0000 mg | ORAL_TABLET | ORAL | 1 refills | Status: DC

## 2020-10-10 MED ORDER — CLOTRIMAZOLE 1 % EX CREA: TOPICAL_CREAM | Freq: Two times a day (BID) | CUTANEOUS | Status: DC

## 2020-10-10 NOTE — Progress Notes (Signed)
Pt stated having vaginal itching, burning and a red rash on the vaginal area. Pt also burning with urination. Pt stated that she noticed the symptoms x 6 days. Pt stated that she tried Diflucan and clobetasol for the symptoms but is still having issues.

## 2020-10-10 NOTE — Progress Notes (Signed)
HPI:      [name] is a 80 y.o. [phone] who LMP was No LMP recorded (lmp unknown). Patient has had a hysterectomy.  Subjective:   She presents today complaining of a significant exacerbation of vulvar itching and burning.  She has previously been placed on Diflucan and clobetasol.  She has tried both and states that neither is working. Patient is a diabetic and states that over the last 6 months her sugars have not been well controlled.    Hx: The following portions of the patient's history were reviewed and updated as appropriate:             She  has a past medical history of Abdominal pain, Allergy, Cancer ([place]) (2017), Cataract, CHF (congestive heart failure) ([place]), Collagenous colitis, Diabetes mellitus without complication ([place]), Diarrhea, Diverticulosis, Fatty liver disease, nonalcoholic, Fibrocystic breast, GERD (gastroesophageal reflux disease), Heart murmur, Hyperlipidemia, Hypertension, Hypothyroidism, IBS (irritable bowel syndrome), IDA (iron deficiency anemia), Personal history of radiation therapy (2017), PONV (postoperative nausea and vomiting), and Sleep apnea. She does not have any pertinent problems on file. She  has a past surgical history that includes Cardiac surgery; Abdominal surgery; Cholecystectomy; left sinusplasty ; Joint replacement (Left); Cataract extraction (Right); Partial mastectomy with needle localization (Left, 09/29/2015); Mastectomy, partial (Left, 10/17/2015); Total abdominal hysterectomy w/ bilateral salpingoophorectomy; Appendectomy; Colonoscopy with propofol (N/A, 03/11/2016); Esophagogastroduodenoscopy (egd) with propofol (N/A, 03/11/2016); Abdominal hysterectomy; Hardware Removal (Right, 09/03/2018); Ganglion cyst excision (Right, 09/03/2018); Finger arthroscopy with carpometacarpel(cmc) arthroplasty (Right, 09/03/2018); Breast biopsy (Bilateral, 2016); Breast biopsy (Left, 09/11/2015); Breast biopsy (Left, 05/27/2016); Breast biopsy (Left, 11/20/2017);  Breast lumpectomy (Left, 10/17/2015); Esophagogastroduodenoscopy (egd) with propofol (N/A, 02/18/2020); Colonoscopy with propofol (N/A, 02/18/2020); Esophagogastroduodenoscopy (egd) with propofol (N/A, 04/25/2020); and Esophagogastroduodenoscopy (egd) with propofol (N/A, 05/23/2020). Her family history includes Bladder Cancer in her brother; Breast cancer in her paternal grandmother; Colon cancer in her father and maternal uncle; Diabetes in her brother and sister; Heart disease in her brother; Leukemia in her mother; Prostate cancer in her brother and brother. She  reports that she has never smoked. She has never used smokeless tobacco. She reports that she does not drink alcohol and does not use drugs. She has a current medication list which includes the following prescription(s): azelastine, benzonatate, vitamin d, clobetasol ointment, cyanocobalamin, desloratadine, dicyclomine, docusate sodium, trulicity, epinephrine, escitalopram, esomeprazole, estradiol, fenofibrate, fluticasone, gabapentin, hydroxyzine, hyoscyamine sulfate, tresiba, levothyroxine, magnesium oxide, nebivolol, novolog flexpen, ondansetron, potassium chloride sa, saccharomyces boulardii, simvastatin, torsemide, valacyclovir, and fluconazole. She is allergic to codeine, demeclocycline, demerol [meperidine], hydrocodone, oxycodone, pentazocine, tetracyclines & related, and fentanyl.       Review of Systems:  Review of Systems  Constitutional: Denied constitutional symptoms, night sweats, recent illness, fatigue, fever, insomnia and weight loss.  Eyes: Denied eye symptoms, eye pain, photophobia, vision change and visual disturbance.  Ears/Nose/Throat/Neck: Denied ear, nose, throat or neck symptoms, hearing loss, nasal discharge, sinus congestion and sore throat.  Cardiovascular: Denied cardiovascular symptoms, arrhythmia, chest pain/pressure, edema, exercise intolerance, orthopnea and palpitations.  Respiratory: Denied pulmonary symptoms,  asthma, pleuritic pain, productive sputum, cough, dyspnea and wheezing.  Gastrointestinal: Denied, gastro-esophageal reflux, melena, nausea and vomiting.  Genitourinary: See HPI for additional information.  Musculoskeletal: Denied musculoskeletal symptoms, stiffness, swelling, muscle weakness and myalgia.  Dermatologic: Denied dermatology symptoms, rash and scar.  Neurologic: Denied neurology symptoms, dizziness, headache, neck pain and syncope.  Psychiatric: Denied psychiatric symptoms, anxiety and depression.  Endocrine: Denied endocrine symptoms including hot flashes and night sweats.   Meds:  Current Outpatient Medications on File Prior to Visit  Medication Sig Dispense Refill   azelastine (OPTIVAR) 0.05 % ophthalmic solution 1 drop into affected eye     benzonatate (TESSALON) 200 MG capsule Take 200 mg by mouth 3 (three) times daily as needed for cough.     Cholecalciferol (VITAMIN D) 50 MCG (2000 UT) CAPS Take 2,000 Units by mouth daily.     clobetasol ointment (TEMOVATE) 8.31 % Apply 1 application topically 2 (two) times a week. 45 g 1   cyanocobalamin (,VITAMIN B-12,) 1000 MCG/ML injection 1,000 mcg every 30 (thirty) days.     desloratadine (CLARINEX) 5 MG tablet Take 1 tablet by mouth daily.     dicyclomine (BENTYL) 10 MG capsule Take 10 mg by mouth at bedtime.      docusate sodium (COLACE) 100 MG capsule Take 1 capsule by mouth 2 (two) times daily as needed.     Dulaglutide (TRULICITY) 5.17 OH/6.0VP SOPN Inject 1.5 mg into the skin once a week.     EPINEPHrine 0.3 mg/0.3 mL IJ SOAJ injection Inject 0.3 mg into the muscle as needed.     escitalopram (LEXAPRO) 5 MG tablet Take 5 mg by mouth at bedtime.     esomeprazole (NEXIUM) 40 MG capsule Take 40 mg by mouth 2 (two) times daily before a meal.      estradiol (ESTRACE) 0.1 MG/GM vaginal cream Place 1 Applicatorful vaginally as needed.     fenofibrate 160 MG tablet Take 160 mg by mouth at bedtime.     fluticasone (FLONASE) 50  MCG/ACT nasal spray Place 1 spray into the nose daily as needed for allergies.     gabapentin (NEURONTIN) 300 MG capsule Take 300 mg by mouth at bedtime.      hydrOXYzine (ATARAX/VISTARIL) 25 MG tablet 1 tablet as needed     Hyoscyamine Sulfate 0.375 MG TBCR Take 0.375 mg by mouth 2 (two) times daily.      Insulin Degludec (TRESIBA) 100 UNIT/ML SOLN Inject 80 Units into the skin at bedtime.     levothyroxine (SYNTHROID, LEVOTHROID) 75 MCG tablet Take 75 mcg by mouth daily before breakfast.      magnesium oxide (MAG-OX) 400 MG tablet Take 400 mg by mouth 2 (two) times daily.      nebivolol (BYSTOLIC) 2.5 MG tablet Take 1 tablet (2.5 mg total) by mouth daily. 90 tablet 3   NOVOLOG FLEXPEN 100 UNIT/ML FlexPen Inject 15 Units into the skin 3 (three) times daily with meals. Based on sliding scale     ondansetron (ZOFRAN) 4 MG tablet Take 1 tablet (4 mg total) by mouth every 8 (eight) hours as needed for nausea or vomiting. 20 tablet 3   potassium chloride SA (K-DUR,KLOR-CON) 20 MEQ tablet Take 20 mEq by mouth 2 (two) times daily.      saccharomyces boulardii (FLORASTOR) 250 MG capsule Take 250 mg by mouth 2 (two) times daily.      simvastatin (ZOCOR) 20 MG tablet Take 20 mg by mouth at bedtime.     torsemide (DEMADEX) 20 MG tablet Take 1 tablet (20 mg total) by mouth daily. 90 tablet 0   valACYclovir (VALTREX) 500 MG tablet Take 500 mg by mouth See admin instructions. For fever blisters take 536m twice a day as needed  1   fluconazole (DIFLUCAN) 150 MG tablet Take 1 tablet (150 mg total) by mouth once a week. For three doses (Patient not taking: Reported on 10/10/2020) 8 tablet 0   No current facility-administered  medications on file prior to visit.      Objective:     Vitals:   10/10/20 1043  BP: 122/76  Pulse: (!) 103   Filed Weights   10/10/20 1043  Weight: 164 lb 6.4 oz (74.6 kg)              Physical examination   Pelvic:   Vulva: Normal appearance.  No lesions.  Significant  erythema involving both labia and perirectal area.  No lesions are noted.  Vagina: No lesions or abnormalities noted.  Support: Normal pelvic support.  Urethra No masses tenderness or scarring.  Meatus Normal size without lesions or prolapse.  Cervix: Normal appearance.  No lesions.  Anus: Normal exam.  No lesions.  Perineum: Normal exam.  No lesions.             Assessment:    G2P2002 Patient Active Problem List   Diagnosis Date Noted   Portal hypertension ([place])    Esophageal varices without bleeding ([place])    Chronic heart failure with preserved ejection fraction (HFpEF) ([place]) 04/05/2020   Cirrhosis of liver with ascites ([place]) 04/05/2020   Abnormal EKG 04/05/2020   Allergic rhinitis due to pollen 02/24/2020   Chronic allergic conjunctivitis 02/24/2020   Allergic rhinitis due to animal (cat) (dog) hair and dander 02/24/2020   Allergic rhinitis 02/24/2020   Polyp of transverse colon    Hematochezia 02/17/2020   Chronic anticoagulation 02/17/2020   Anasarca 02/17/2020   Symptomatic sinus bradycardia 02/17/2020   PAF (paroxysmal atrial fibrillation) ([place]) 02/17/2020   AKI (acute kidney injury) ([place]) 02/17/2020   History of colitis 02/17/2020   Gastrointestinal hemorrhage    Atrial fibrillation with RVR ([place]) 02/07/2020   NASH (nonalcoholic steatohepatitis)    Hav (hallux abducto valgus), unspecified laterality 06/03/2019   Acquired bilateral hammer toes 06/03/2019   Iron deficiency 04/26/2019   Right-sided chest wall pain 04/12/2019   Mid back pain on right side 04/12/2019   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 Chronic vulvitis   2. UTI symptoms   3. Monilial vulvovaginitis     Patient very symptomatic with exacerbation of her chronic vulvitis.   Plan:            1.  We will use topical antifungals as well as Diflucan.  Sits baths and chronic vulvar care discussed.  Relationship of diabetes and monilia discussed.  2.  We will schedule patient with Dr. Fransisca Connors for a second opinion and possible alternate strategy.  Orders Orders Placed This Encounter  Procedures   POCT urinalysis dipstick    No orders of the defined types were placed in this encounter.     F/U  Return in about 2 weeks (around 10/24/2020). I spent 31 minutes involved in the care of this patient preparing to see the patient by obtaining and reviewing her medical history (including labs, imaging tests and prior procedures), documenting clinical information in the electronic health record (EHR), counseling and coordinating care  plans, writing and sending prescriptions, ordering tests or procedures and in direct communicating with the patient and medical staff discussing pertinent items from her history and physical exam.  Finis Bud, M.D. 10/10/2020 11:17 AM

## 2020-10-10 NOTE — Telephone Encounter (Signed)
Pt called stating that she only got one prescription when she went to pharmacy, sh is missing the rx for the cream. Confirmed her [facility] [place]. Please Advise.

## 2020-10-11 LAB — CERVICOVAGINAL ANCILLARY ONLY
Candida Glabrata: POSITIVE — AB
Candida Vaginitis: POSITIVE — AB
Comment: NEGATIVE
Comment: NEGATIVE

## 2020-10-11 MED ORDER — CLOTRIMAZOLE 1 % EX CREA: 1.0000 "application " | TOPICAL_CREAM | Freq: Two times a day (BID) | CUTANEOUS | 6 refills | Status: DC

## 2020-10-11 NOTE — Telephone Encounter (Signed)
Called pt this morning to inform her that I had sent in cream prescribed by DJE during her appointment. Pt asked me when will she be seeing the referral doctor. Informed pt that I would have to speak to the referral coord. And would call her back. Pt stated that she understood.

## 2020-10-18 ENCOUNTER — Encounter: Payer: Self-pay | Admitting: Internal Medicine

## 2020-10-18 ENCOUNTER — Ambulatory Visit (INDEPENDENT_AMBULATORY_CARE_PROVIDER_SITE_OTHER): Payer: Medicare PPO | Admitting: Internal Medicine

## 2020-10-18 ENCOUNTER — Other Ambulatory Visit: Payer: Self-pay

## 2020-10-18 VITALS — BP 100/70 | HR 122 | Ht 63.0 in | Wt 162.0 lb

## 2020-10-18 DIAGNOSIS — I4891 Unspecified atrial fibrillation: Secondary | ICD-10-CM

## 2020-10-18 DIAGNOSIS — I10 Essential (primary) hypertension: Secondary | ICD-10-CM

## 2020-10-18 DIAGNOSIS — I5032 Chronic diastolic (congestive) heart failure: Secondary | ICD-10-CM

## 2020-10-18 DIAGNOSIS — R001 Bradycardia, unspecified: Secondary | ICD-10-CM

## 2020-10-18 DIAGNOSIS — I4892 Unspecified atrial flutter: Secondary | ICD-10-CM | POA: Diagnosis not present

## 2020-10-18 DIAGNOSIS — I480 Paroxysmal atrial fibrillation: Secondary | ICD-10-CM

## 2020-10-18 NOTE — Progress Notes (Signed)
Follow-up Outpatient Visit Date: 10/18/2020  Primary Care Provider: Albina Billet, MD [address]   [place] [address]  Chief Complaint: Follow-up atrial fibrillation/flutter  HPI:  Annette Foster is a 80 y.o. female with history of remote valve surgery in the 1970s, HFpEF, paroxysmal atrial fibrillation complicated by GI bleed on anticoagulation, hypertension, hyperlipidemia, Karlene Lineman, seizure disorder, OSA, and left breast cancer status postmastectomy, who presents for follow-up of atrial fibrillation and valvular heart disease.  I last saw her in February, at which time Annette Foster complained primarily of fatigue and exertional dyspnea when walking quickly.  Annette Foster was referred to Dr. Quentin Ore for consideration of Watchman placement.  Unfortunately, pre-Watchman CTA showed unfavorable anatomy for device placement.  Today, Annette Foster reports that she has experienced intermittent palpitations.  Her HR jumps from 50 bpm to 120 bpm almost daily.  There are no clear precipitants.  She denies chest pain, shortness of breath, lightheadedness, and dizziness.  She is scheduled to see Dr. Quentin Ore later this month to readdress Watchman.  Her only complaint is of frequent cramps in her legs, most often at rest.  --------------------------------------------------------------------------------------------------  Past Medical History:  Diagnosis Date   Abdominal pain    Allergy    Cancer ([place]) 2017   breast cancer- Left   Cataract    CHF (congestive heart failure) ([facility])    Collagenous colitis    Diabetes mellitus without complication ([facility])    Diarrhea    Diverticulosis    Fatty liver disease, nonalcoholic    Fibrocystic breast    GERD (gastroesophageal reflux disease)    Heart murmur    Hyperlipidemia    Hypertension    Hypothyroidism    IBS (irritable bowel syndrome)    IDA (iron deficiency anemia)    Personal history of radiation therapy 2017   LEFT BREAST CA   PONV (postoperative  nausea and vomiting)    Sleep apnea    C-Pap   Past Surgical History:  Procedure Laterality Date   ABDOMINAL HYSTERECTOMY     [place] bso   ABDOMINAL SURGERY     APPENDECTOMY     BREAST BIOPSY Bilateral 2016   negative   BREAST 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/2015   DCIS, papillary carcinoma in situ   BREAST BIOPSY Left 05/27/2016   BENIGN MAMMARY EPITHELIUM   BREAST BIOPSY Left 11/20/2017   affirm bx x clip BENIGN MAMMARY EPITHELIUM CONSISTENT WITH RAD THERAPY   BREAST LUMPECTOMY Left 10/17/2015   DCIS and papillary carcinoma insitu, clear margins   CARDIAC SURGERY     has replacement valve   CATARACT EXTRACTION Right    CHOLECYSTECTOMY     COLONOSCOPY WITH PROPOFOL N/A 03/11/2016   Procedure: COLONOSCOPY WITH PROPOFOL;  Surgeon: Manya Silvas, MD;  Location: [facility] ENDOSCOPY;  Service: Endoscopy;  Laterality: N/A;   COLONOSCOPY WITH PROPOFOL N/A 02/18/2020   Procedure: COLONOSCOPY WITH PROPOFOL;  Surgeon: [name], MD;  Location: [facility] ENDOSCOPY;  Service: Endoscopy;  Laterality: N/A;   ESOPHAGOGASTRODUODENOSCOPY (EGD) WITH PROPOFOL N/A 03/11/2016   Procedure: ESOPHAGOGASTRODUODENOSCOPY (EGD) WITH PROPOFOL;  Surgeon: Manya Silvas, MD;  Location: [facility] ENDOSCOPY;  Service: Endoscopy;  Laterality: N/A;   ESOPHAGOGASTRODUODENOSCOPY (EGD) WITH PROPOFOL N/A 02/18/2020   Procedure: ESOPHAGOGASTRODUODENOSCOPY (EGD) WITH PROPOFOL;  Surgeon: [name], MD;  Location: [facility] ENDOSCOPY;  Service: Endoscopy;  Laterality: N/A;   ESOPHAGOGASTRODUODENOSCOPY (EGD) WITH PROPOFOL N/A 04/25/2020   Procedure: ESOPHAGOGASTRODUODENOSCOPY (EGD) WITH PROPOFOL;  Surgeon: [name], MD;  Location: [facility] ENDOSCOPY;  Service:  Endoscopy;  Laterality: N/A;   ESOPHAGOGASTRODUODENOSCOPY (EGD) WITH PROPOFOL N/A 05/23/2020   Procedure: ESOPHAGOGASTRODUODENOSCOPY (EGD) WITH PROPOFOL;  Surgeon: [name], MD;  Location: [facility] ENDOSCOPY;  Service: Endoscopy;  Laterality: N/A;   FINGER ARTHROSCOPY WITH CARPOMETACARPEL ([place])  ARTHROPLASTY Right 09/03/2018   Procedure: CARPOMETACARPEL [facility]) ARTHROPLASTY RIGHT THUMB;  Surgeon: Hessie Knows, MD;  Location: [facility] ORS;  Service: Orthopedics;  Laterality: Right;   GANGLION CYST EXCISION Right 09/03/2018   Procedure: REMOVAL GANGLION OF WRIST;  Surgeon: Hessie Knows, MD;  Location: [facility] ORS;  Service: Orthopedics;  Laterality: Right;   HARDWARE REMOVAL Right 09/03/2018   Procedure: HARDWARE REMOVAL RIGHT THUMB;  Surgeon: Hessie Knows, MD;  Location: [facility] ORS;  Service: Orthopedics;  Laterality: Right;  staple removed   JOINT REPLACEMENT Left    TKR   left sinusplasty      MASTECTOMY, PARTIAL Left 10/17/2015   Procedure: MASTECTOMY PARTIAL REVISION;  Surgeon: Leonie Green, MD;  Location: [facility] ORS;  Service: General;  Laterality: Left;   PARTIAL MASTECTOMY WITH NEEDLE LOCALIZATION Left 09/29/2015   Procedure: PARTIAL MASTECTOMY WITH NEEDLE LOCALIZATION;  Surgeon: Leonie Green, MD;  Location: [facility] ORS;  Service: General;  Laterality: Left;   TOTAL ABDOMINAL HYSTERECTOMY W/ BILATERAL SALPINGOOPHORECTOMY      Current Meds  Medication Sig   azelastine (OPTIVAR) 0.05 % ophthalmic solution daily as needed.   benzonatate (TESSALON) 200 MG capsule Take 200 mg by mouth 3 (three) times daily as needed for cough.   Cholecalciferol (VITAMIN D) 50 MCG (2000 UT) CAPS Take 2,000 Units by mouth daily.   clobetasol ointment (TEMOVATE) 2.42 % Apply 1 application topically 2 (two) times a week.   clotrimazole (LOTRIMIN) 1 % cream Apply 1 application topically 2 (two) times daily. Apply to irritated vulvar area twice daily.   cyanocobalamin (,VITAMIN B-12,) 1000 MCG/ML injection 1,000 mcg every 30 (thirty) days.   desloratadine (CLARINEX) 5 MG tablet Take 1 tablet by mouth daily.   dicyclomine (BENTYL) 10 MG capsule Take 10 mg by mouth at bedtime.    docusate sodium (COLACE) 100 MG capsule Take 1 capsule by mouth 2 (two) times daily as needed.   Dulaglutide (TRULICITY) 6.83  MH/9.6QI SOPN Inject 1.5 mg into the skin once a week.   EPINEPHrine 0.3 mg/0.3 mL IJ SOAJ injection Inject 0.3 mg into the muscle as needed.   escitalopram (LEXAPRO) 5 MG tablet Take 5 mg by mouth at bedtime.   esomeprazole (NEXIUM) 40 MG capsule Take 40 mg by mouth 2 (two) times daily before a meal.    estradiol (ESTRACE) 0.1 MG/GM vaginal cream Place 1 Applicatorful vaginally as needed.   fenofibrate 160 MG tablet Take 160 mg by mouth at bedtime.   fluconazole (DIFLUCAN) 150 MG tablet Take 1 tablet (150 mg total) by mouth every 3 (three) days. For three doses   fluticasone (FLONASE) 50 MCG/ACT nasal spray Place 1 spray into the nose daily as needed for allergies.   gabapentin (NEURONTIN) 300 MG capsule Take 300 mg by mouth at bedtime.    hydrOXYzine (ATARAX/VISTARIL) 25 MG tablet 1 tablet as needed   Hyoscyamine Sulfate 0.375 MG TBCR Take 0.375 mg by mouth 2 (two) times daily.    Insulin Degludec (TRESIBA) 100 UNIT/ML SOLN Inject 80 Units into the skin at bedtime.   levothyroxine (SYNTHROID, LEVOTHROID) 75 MCG tablet Take 75 mcg by mouth daily before breakfast.    magnesium oxide (MAG-OX) 400 MG tablet Take 400 mg by mouth 2 (two) times daily.  nebivolol (BYSTOLIC) 2.5 MG tablet Take 1 tablet (2.5 mg total) by mouth daily.   NOVOLOG FLEXPEN 100 UNIT/ML FlexPen Inject 15 Units into the skin 3 (three) times daily with meals. Based on sliding scale   ondansetron (ZOFRAN) 4 MG tablet Take 1 tablet (4 mg total) by mouth every 8 (eight) hours as needed for nausea or vomiting.   potassium chloride SA (K-DUR,KLOR-CON) 20 MEQ tablet Take 20 mEq by mouth 2 (two) times daily.    saccharomyces boulardii (FLORASTOR) 250 MG capsule Take 250 mg by mouth 2 (two) times daily.    simvastatin (ZOCOR) 20 MG tablet Take 20 mg by mouth at bedtime.   torsemide (DEMADEX) 20 MG tablet Take 1 tablet (20 mg total) by mouth daily.   valACYclovir (VALTREX) 500 MG tablet Take 500 mg by mouth See admin instructions. For  fever blisters take 567m twice a day as needed    Allergies: Codeine, Demeclocycline, Demerol [meperidine], Hydrocodone, Oxycodone, Pentazocine, Tetracyclines & related, and Fentanyl  Social History   Tobacco Use   Smoking status: Never   Smokeless tobacco: Never  Vaping Use   Vaping Use: Never used  Substance Use Topics   Alcohol use: No    Alcohol/week: 0.0 standard drinks   Drug use: No    Family History  Problem Relation Age of Onset   Breast cancer Paternal Grandmother    Colon cancer Father    Diabetes Sister    Diabetes Brother    Heart disease Brother    Prostate cancer Brother    Colon cancer Maternal Uncle    Prostate cancer Brother    Bladder Cancer Brother    Leukemia Mother        all   Ovarian cancer Neg Hx    Kidney cancer Neg Hx     Review of Systems: A 12-system review of systems was performed and was negative except as noted in the HPI.  --------------------------------------------------------------------------------------------------  Physical Exam: BP 100/70 (BP Location: Right Arm, Patient Position: Sitting, Cuff Size: Normal)   Pulse (!) 122   Ht 5' 3"  (1.6 m)   Wt 162 lb (73.5 kg)   LMP  (LMP Unknown)   SpO2 98%   BMI 28.70 kg/m   General:  NAD. Neck: No JVD or HJR. Lungs: Clear to auscultation bilaterally without wheezes or crackles. Heart: Tachycardic but regular without murmurs. Abdomen: Soft, nontender, nondistended. Extremities: No lower extremity edema.  EKG:  Atrial flutter with 2:1 conduction and inferolateral ST/T changes.  Compared with prior tracing from 07/13/2020, atrial flutter has replaced sinus bradycardia.  Lab Results  Component Value Date   WBC 2.6 (L) 09/12/2020   HGB 13.5 09/12/2020   HCT 40.7 09/12/2020   MCV 90.8 09/12/2020   PLT 87 (L) 09/12/2020    Lab Results  Component Value Date   NA 131 (L) 09/12/2020   K 3.8 09/12/2020   CL 90 (L) 09/12/2020   CO2 31 09/12/2020   BUN 23 09/12/2020    CREATININE 0.90 09/12/2020   GLUCOSE 397 (H) 09/12/2020   ALT 19 09/12/2020    No results found for: CHOL, HDL, LDLCALC, LDLDIRECT, TRIG, CHOLHDL  --------------------------------------------------------------------------------------------------  ASSESSMENT AND PLAN: Atrial fibrillation/flutter: Patient reports daily episodes of elevated heart rates consistent with paroxysmal atrial fibrillation/flutter.  She is in flutter today with ventricular rate around 120 bpm.  She is asymptomatic.  Unfortunately, she will not tolerate escalation of nadolol given significant bradycardia when she is sinus rhythm.  She also did not  tolerate digoxin in the past due to bradycardia.  I have reviewed her case with Dr. Lovena Le (EP), as Dr. Quentin Ore is out of the office this week.  We recommend continuing current medications.  Follow-up appointment with Dr. Quentin Ore has been moved up to next week to readdress atrial fibrillation/flutter and possible Watchman implantation.  Continue to defer anticoagulation in the setting of prior GI bleeding.  Chronic HFpEF: Annette. Fahey appears euvolemic with NYHA class II symptoms.  Continue low-dose nadolol (this particular agent was requested by GI due to esophageal varices).  Follow-up: Return to clinic in 6 weeks.  Nelva Bush, MD 10/18/2020 11:07 AM

## 2020-10-18 NOTE — Patient Instructions (Signed)
Medication Instructions:   Your physician recommends that you continue on your current medications as directed. Please refer to the Current Medication list given to you today.  *If you need a refill on your cardiac medications before your next appointment, please call your pharmacy*   Lab Work:  None ordered   Testing/Procedures:  None ordered   Follow-Up: At [facility], you and your health needs are our priority.  As part of our continuing mission to provide you with exceptional heart care, we have created designated Provider Care Teams.  These Care Teams include your primary Cardiologist (physician) and Advanced Practice Providers (APPs -  Physician Assistants and Nurse Practitioners) who all work together to provide you with the care you need, when you need it.  We recommend signing up for the patient portal called "MyChart".  Sign up information is provided on this After Visit Summary.  MyChart is used to connect with patients for Virtual Visits (Telemedicine).  Patients are able to view lab/test results, encounter notes, upcoming appointments, etc.  Non-urgent messages can be sent to your provider as well.   To learn more about what you can do with MyChart, go to [email or web address].    Your next appointment:   6 week(s)  The format for your next appointment:   In Person  Provider:   You may see Nelva Bush, MD or one of the following Advanced Practice Providers on your designated Care Team:   Murray Hodgkins, NP Christell Faith, PA-C Marrianne Mood, PA-C Cadence [place], [place]

## 2020-10-19 ENCOUNTER — Encounter: Payer: Self-pay | Admitting: Internal Medicine

## 2020-10-19 ENCOUNTER — Telehealth: Payer: Self-pay

## 2020-10-19 NOTE — Telephone Encounter (Signed)
Referral received and reviewed from Dr. 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 Call placed to [name] and appointment arranged for 9/21 at 1100. Sooner appointment with Dr. Theora Gianotti offered and she declined.

## 2020-10-19 NOTE — Telephone Encounter (Signed)
Pt states she has not heard from her referral to [place].   Pt aware I will look into this and call her back.

## 2020-10-20 NOTE — Telephone Encounter (Signed)
Pt aware appt with [place]- 9/21.

## 2020-10-25 ENCOUNTER — Ambulatory Visit (INDEPENDENT_AMBULATORY_CARE_PROVIDER_SITE_OTHER): Payer: Medicare PPO | Admitting: Cardiology

## 2020-10-25 ENCOUNTER — Other Ambulatory Visit: Payer: Self-pay

## 2020-10-25 ENCOUNTER — Encounter: Payer: Self-pay | Admitting: Cardiology

## 2020-10-25 VITALS — BP 110/70 | HR 95 | Ht 63.0 in | Wt 163.0 lb

## 2020-10-25 DIAGNOSIS — K922 Gastrointestinal hemorrhage, unspecified: Secondary | ICD-10-CM | POA: Diagnosis not present

## 2020-10-25 DIAGNOSIS — I480 Paroxysmal atrial fibrillation: Secondary | ICD-10-CM

## 2020-10-25 DIAGNOSIS — K7460 Unspecified cirrhosis of liver: Secondary | ICD-10-CM | POA: Diagnosis not present

## 2020-10-25 DIAGNOSIS — I8500 Esophageal varices without bleeding: Secondary | ICD-10-CM

## 2020-10-25 DIAGNOSIS — R188 Other ascites: Secondary | ICD-10-CM

## 2020-10-25 NOTE — Patient Instructions (Signed)
Medication Instructions:  Your physician recommends that you continue on your current medications as directed. Please refer to the Current Medication list given to you today.  *If you need a refill on your cardiac medications before your next appointment, please call your pharmacy*   Lab Work: None ordered.  If you have labs (blood work) drawn today and your tests are completely normal, you will receive your results only by: [place] (if you have MyChart) OR A paper copy in the mail If you have any lab test that is abnormal or we need to change your treatment, we will call you to review the results.   Testing/Procedures: None ordered.    Follow-Up: At [facility], you and your health needs are our priority.  As part of our continuing mission to provide you with exceptional heart care, we have created designated Provider Care Teams.  These Care Teams include your primary Cardiologist (physician) and Advanced Practice Providers (APPs -  Physician Assistants and Nurse Practitioners) who all work together to provide you with the care you need, when you need it.  We recommend signing up for the patient portal called "MyChart".  Sign up information is provided on this After Visit Summary.  MyChart is used to connect with patients for Virtual Visits (Telemedicine).  Patients are able to view lab/test results, encounter notes, upcoming appointments, etc.  Non-urgent messages can be sent to your provider as well.   To learn more about what you can do with MyChart, go to [email or web address].    Your next appointment:   Follow up with Dr Quentin Ore as needed

## 2020-10-25 NOTE — Progress Notes (Signed)
Electrophysiology Office Follow up Visit Note:    Date:  10/25/2020   ID:  Annette Foster, DOB 09-Jul-1940, MRN [id number]  PCP:  Albina Billet, MD  [facility] Cardiologist:  Nelva Bush, MD  [facility] Electrophysiologist:  Vickie Epley, MD    Interval History:    Annette Foster is a 80 y.o. female who presents for a follow up visit.  I last saw the patient August 16, 2020 as a referral for watchman.  She has a history of GI bleeding due to esophageal varices and is undergone banding procedures in the past.  She is currently not taking an anticoagulant.  As part of the evaluation for the watchman device, she underwent a CT chest on September 08, 2020 which showed a very shallow chicken wing morphology left atrial appendage.     Past Medical History:  Diagnosis Date   Abdominal pain    Allergy    Cancer ([place]) 2017   breast cancer- Left   Cataract    CHF (congestive heart failure) (HCC)    Collagenous colitis    Diabetes mellitus without complication (HCC)    Diarrhea    Diverticulosis    Fatty liver disease, nonalcoholic    Fibrocystic breast    GERD (gastroesophageal reflux disease)    Heart murmur    Hyperlipidemia    Hypertension    Hypothyroidism    IBS (irritable bowel syndrome)    IDA (iron deficiency anemia)    Personal history of radiation therapy 2017   LEFT BREAST CA   PONV (postoperative nausea and vomiting)    Sleep apnea    C-Pap    Past Surgical History:  Procedure Laterality Date   ABDOMINAL HYSTERECTOMY     [place] bso   ABDOMINAL SURGERY     APPENDECTOMY     BREAST BIOPSY Bilateral 2016   negative   BREAST 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/2015   DCIS, papillary carcinoma in situ   BREAST BIOPSY Left 05/27/2016   BENIGN MAMMARY EPITHELIUM   BREAST BIOPSY Left 11/20/2017   affirm bx x clip BENIGN MAMMARY EPITHELIUM CONSISTENT WITH RAD THERAPY   BREAST LUMPECTOMY Left 10/17/2015   DCIS and papillary carcinoma insitu, clear margins   CARDIAC  SURGERY     has replacement valve   CATARACT EXTRACTION Right    CHOLECYSTECTOMY     COLONOSCOPY WITH PROPOFOL N/A 03/11/2016   Procedure: COLONOSCOPY WITH PROPOFOL;  Surgeon: Manya Silvas, MD;  Location[facility] ENDOSCOPY;  Service: Endoscopy;  Laterality: N/A;   COLONOSCOPY WITH PROPOFOL N/A 02/18/2020   Procedure: COLONOSCOPY WITH PROPOFOL;  Surgeon: [name], MD;  Location: [facility] ENDOSCOPY;  Service: Endoscopy;  Laterality: N/A;   ESOPHAGOGASTRODUODENOSCOPY (EGD) WITH PROPOFOL N/A 03/11/2016   Procedure: ESOPHAGOGASTRODUODENOSCOPY (EGD) WITH PROPOFOL;  Surgeon: Manya Silvas, MD;  Location: [facility] ENDOSCOPY;  Service: Endoscopy;  Laterality: N/A;   ESOPHAGOGASTRODUODENOSCOPY (EGD) WITH PROPOFOL N/A 02/18/2020   Procedure: ESOPHAGOGASTRODUODENOSCOPY (EGD) WITH PROPOFOL;  Surgeon: [name], MD;  Location: [facility] ENDOSCOPY;  Service: Endoscopy;  Laterality: N/A;   ESOPHAGOGASTRODUODENOSCOPY (EGD) WITH PROPOFOL N/A 04/25/2020   Procedure: ESOPHAGOGASTRODUODENOSCOPY (EGD) WITH PROPOFOL;  Surgeon: [name], MD;  Location: [facility] ENDOSCOPY;  Service: Endoscopy;  Laterality: N/A;   ESOPHAGOGASTRODUODENOSCOPY (EGD) WITH PROPOFOL N/A 05/23/2020   Procedure: ESOPHAGOGASTRODUODENOSCOPY (EGD) WITH PROPOFOL;  Surgeon: [name], MD;  Location: [facility] ENDOSCOPY;  Service: Endoscopy;  Laterality: N/A;   FINGER ARTHROSCOPY WITH CARPOMETACARPEL ([place]) ARTHROPLASTY Right 09/03/2018   Procedure: CARPOMETACARPEL ([place]) ARTHROPLASTY RIGHT THUMB;  Surgeon: Hessie Knows, MD;  Location: [facility] ORS;  Service: Orthopedics;  Laterality: Right;   GANGLION CYST EXCISION Right 09/03/2018   Procedure: REMOVAL GANGLION OF WRIST;  Surgeon: Hessie Knows, MD;  Location: [facility] ORS;  Service: Orthopedics;  Laterality: Right;   HARDWARE REMOVAL Right 09/03/2018   Procedure: HARDWARE REMOVAL RIGHT THUMB;  Surgeon: Hessie Knows, MD;  Location: [facility] ORS;  Service: Orthopedics;  Laterality: Right;  staple removed   JOINT REPLACEMENT  Left    TKR   left sinusplasty      MASTECTOMY, PARTIAL Left 10/17/2015   Procedure: MASTECTOMY PARTIAL REVISION;  Surgeon: Leonie Green, MD;  Location: [facility] ORS;  Service: General;  Laterality: Left;   PARTIAL MASTECTOMY WITH NEEDLE LOCALIZATION Left 09/29/2015   Procedure: PARTIAL MASTECTOMY WITH NEEDLE LOCALIZATION;  Surgeon: Leonie Green, MD;  Location: [facility] ORS;  Service: General;  Laterality: Left;   TOTAL ABDOMINAL HYSTERECTOMY W/ BILATERAL SALPINGOOPHORECTOMY      Current Medications: Current Meds  Medication Sig   azelastine (OPTIVAR) 0.05 % ophthalmic solution daily as needed.   benzonatate (TESSALON) 200 MG capsule Take 200 mg by mouth 3 (three) times daily as needed for cough.   Cholecalciferol (VITAMIN D) 50 MCG (2000 UT) CAPS Take 2,000 Units by mouth daily.   clobetasol ointment (TEMOVATE) 6.65 % Apply 1 application topically 2 (two) times a week.   clotrimazole (LOTRIMIN) 1 % cream Apply 1 application topically 2 (two) times daily. Apply to irritated vulvar area twice daily.   cyanocobalamin (,VITAMIN B-12,) 1000 MCG/ML injection 1,000 mcg every 30 (thirty) days.   desloratadine (CLARINEX) 5 MG tablet Take 1 tablet by mouth daily.   dicyclomine (BENTYL) 10 MG capsule Take 10 mg by mouth at bedtime.    docusate sodium (COLACE) 100 MG capsule Take 1 capsule by mouth 2 (two) times daily as needed.   Dulaglutide (TRULICITY) 9.93 TT/0.1XB SOPN Inject 1.5 mg into the skin once a week.   EPINEPHrine 0.3 mg/0.3 mL IJ SOAJ injection Inject 0.3 mg into the muscle as needed.   escitalopram (LEXAPRO) 5 MG tablet Take 5 mg by mouth at bedtime.   esomeprazole (NEXIUM) 40 MG capsule Take 40 mg by mouth 2 (two) times daily before a meal.    estradiol (ESTRACE) 0.1 MG/GM vaginal cream Place 1 Applicatorful vaginally as needed.   fenofibrate 160 MG tablet Take 160 mg by mouth at bedtime.   fluconazole (DIFLUCAN) 150 MG tablet Take 1 tablet (150 mg total) by mouth every 3 (three)  days. For three doses   fluticasone (FLONASE) 50 MCG/ACT nasal spray Place 1 spray into the nose daily as needed for allergies.   gabapentin (NEURONTIN) 300 MG capsule Take 300 mg by mouth at bedtime.    hydrOXYzine (ATARAX/VISTARIL) 25 MG tablet 1 tablet as needed   Hyoscyamine Sulfate 0.375 MG TBCR Take 0.375 mg by mouth 2 (two) times daily.    Insulin Degludec (TRESIBA) 100 UNIT/ML SOLN Inject 80 Units into the skin at bedtime.   levothyroxine (SYNTHROID, LEVOTHROID) 75 MCG tablet Take 75 mcg by mouth daily before breakfast.    magnesium oxide (MAG-OX) 400 MG tablet Take 400 mg by mouth 2 (two) times daily.    nebivolol (BYSTOLIC) 2.5 MG tablet Take 1 tablet (2.5 mg total) by mouth daily.   NOVOLOG FLEXPEN 100 UNIT/ML FlexPen Inject 15 Units into the skin 3 (three) times daily with meals. Based on sliding scale   ondansetron (ZOFRAN) 4 MG tablet Take 1 tablet (4 mg total)  by mouth every 8 (eight) hours as needed for nausea or vomiting.   potassium chloride SA (K-DUR,KLOR-CON) 20 MEQ tablet Take 20 mEq by mouth 2 (two) times daily.    saccharomyces boulardii (FLORASTOR) 250 MG capsule Take 250 mg by mouth 2 (two) times daily.    simvastatin (ZOCOR) 20 MG tablet Take 20 mg by mouth at bedtime.   torsemide (DEMADEX) 20 MG tablet Take 1 tablet (20 mg total) by mouth daily.   valACYclovir (VALTREX) 500 MG tablet Take 500 mg by mouth See admin instructions. For fever blisters take 531m twice a day as needed     Allergies:   Codeine, Demeclocycline, Demerol [meperidine], Hydrocodone, Oxycodone, Pentazocine, Tetracyclines & related, and Fentanyl   Social History   Socioeconomic History   Marital status: Married    Spouse name: Not on file   Number of children: Not on file   Years of education: Not on file   Highest education level: Not on file  Occupational History   Not on file  Tobacco Use   Smoking status: Never   Smokeless tobacco: Never  Vaping Use   Vaping Use: Never used   Substance and Sexual Activity   Alcohol use: No    Alcohol/week: 0.0 standard drinks   Drug use: No   Sexual activity: Not Currently    Birth control/protection: Surgical  Other Topics Concern   Not on file  Social History Narrative   Not on file   Social Determinants of Health   Financial Resource Strain: Not on file  Food Insecurity: Not on file  Transportation Needs: Not on file  Physical Activity: Not on file  Stress: Not on file  Social Connections: Not on file     Family History: The patient's family history includes Bladder Cancer in her brother; Breast cancer in her paternal grandmother; Colon cancer in her father and maternal uncle; Diabetes in her brother and sister; Heart disease in her brother; Leukemia in her mother; Prostate cancer in her brother and brother. There is no history of Ovarian cancer or Kidney cancer.  ROS:   Please see the history of present illness.    All other systems reviewed and are negative.  EKGs/Labs/Other Studies Reviewed:    The following studies were reviewed today:  September 08, 2020 CT chest personally reviewed Left atrial appendage is a small chicken wing morphology with a very shallow depth.  Maximum length is 7.6 mm.   Recent Labs: 02/07/2020: Magnesium 2.3; TSH 3.169 02/16/2020: B Natriuretic Peptide 377.8 09/12/2020: ALT 19; BUN 23; Creatinine, Ser 0.90; Hemoglobin 13.5; Platelets 87; Potassium 3.8; Sodium 131  Recent Lipid Panel No results found for: CHOL, TRIG, HDL, CHOLHDL, VLDL, LDLCALC, LDLDIRECT  Physical Exam:    VS:  BP 110/70 (BP Location: Left Arm, Patient Position: Sitting, Cuff Size: Normal)   Pulse 95   Ht 5' 3"  (1.6 m)   Wt 163 lb (73.9 kg)   LMP  (LMP Unknown)   SpO2 97%   BMI 28.87 kg/m     Wt Readings from Last 3 Encounters:  10/25/20 163 lb (73.9 kg)  10/18/20 162 lb (73.5 kg)  10/10/20 164 lb 6.4 oz (74.6 kg)     GEN:  Well nourished, well developed in no acute distress HEENT: Normal NECK: No JVD;  No carotid bruits LYMPHATICS: No lymphadenopathy CARDIAC: RRR, no murmurs, rubs, gallops RESPIRATORY:  Clear to auscultation without rales, wheezing or rhonchi  ABDOMEN: Soft, non-tender, non-distended MUSCULOSKELETAL:  No edema; No deformity  SKIN: Warm and dry NEUROLOGIC:  Alert and oriented x 3 PSYCHIATRIC:  Normal affect   ASSESSMENT:    1. Paroxysmal atrial fibrillation (HCC)   2. Cirrhosis of liver with ascites, unspecified hepatic cirrhosis type ([place])   3. Esophageal varices without bleeding, unspecified esophageal varices type ([place])   4. Gastrointestinal hemorrhage, unspecified gastrointestinal hemorrhage type    PLAN:    In order of problems listed above:   1. Paroxysmal atrial fibrillation (HCC) 2. Cirrhosis of liver with ascites, unspecified hepatic cirrhosis type ([place]) 3. Esophageal varices without bleeding, unspecified esophageal varices type ([place]) 4. Gastrointestinal hemorrhage, unspecified gastrointestinal hemorrhage type  Patient has a history of paroxysmal atrial fibrillation currently not on anticoagulation because of cirrhosis complicated by esophageal varices and GI bleeding.  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  I had a very long discussion with the patient today in clinic about the results of the CT scan and how I did not think she was a good candidate for the watchman procedure.  We discussed the possibility of future technologies that would allow [place] to effectively close her left atrial appendage.    We did discuss how she is not a good candidate for anticoagulation at this time given her history of severe GI bleeding because of esophageal varices.  We discussed how not being on anticoagulation does put her at an increased risk for stroke.    For now recommend continued follow-up with her primary care  physician.     Follow-up with EP as needed.    Total time spent with patient today 30 minutes. This includes reviewing records, evaluating the patient and coordinating care.   Medication Adjustments/Labs and Tests Ordered: Current medicines are reviewed at length with the patient today.  Concerns regarding medicines are outlined above.  No orders of the defined types were placed in this encounter.  No orders of the defined types were placed in this encounter.    Signed, Lars Mage, MD, [facility], [facility] 10/25/2020 9:25 PM    Electrophysiology [facility]

## 2020-11-01 ENCOUNTER — Other Ambulatory Visit: Payer: Self-pay

## 2020-11-01 ENCOUNTER — Inpatient Hospital Stay: Payer: Medicare PPO | Attending: Internal Medicine | Admitting: Obstetrics and Gynecology

## 2020-11-01 ENCOUNTER — Ambulatory Visit: Payer: Medicare PPO | Admitting: Cardiology

## 2020-11-01 VITALS — BP 113/74 | HR 125 | Temp 97.8°F | Resp 20 | Wt 167.4 lb

## 2020-11-01 DIAGNOSIS — R001 Bradycardia, unspecified: Secondary | ICD-10-CM | POA: Insufficient documentation

## 2020-11-01 DIAGNOSIS — I110 Hypertensive heart disease with heart failure: Secondary | ICD-10-CM | POA: Insufficient documentation

## 2020-11-01 DIAGNOSIS — K766 Portal hypertension: Secondary | ICD-10-CM | POA: Insufficient documentation

## 2020-11-01 DIAGNOSIS — K219 Gastro-esophageal reflux disease without esophagitis: Secondary | ICD-10-CM | POA: Insufficient documentation

## 2020-11-01 DIAGNOSIS — E039 Hypothyroidism, unspecified: Secondary | ICD-10-CM | POA: Insufficient documentation

## 2020-11-01 DIAGNOSIS — E119 Type 2 diabetes mellitus without complications: Secondary | ICD-10-CM | POA: Insufficient documentation

## 2020-11-01 DIAGNOSIS — Z794 Long term (current) use of insulin: Secondary | ICD-10-CM | POA: Insufficient documentation

## 2020-11-01 DIAGNOSIS — K7581 Nonalcoholic steatohepatitis (NASH): Secondary | ICD-10-CM | POA: Diagnosis not present

## 2020-11-01 DIAGNOSIS — I5032 Chronic diastolic (congestive) heart failure: Secondary | ICD-10-CM | POA: Diagnosis not present

## 2020-11-01 DIAGNOSIS — I480 Paroxysmal atrial fibrillation: Secondary | ICD-10-CM | POA: Insufficient documentation

## 2020-11-01 DIAGNOSIS — N763 Subacute and chronic vulvitis: Secondary | ICD-10-CM | POA: Diagnosis present

## 2020-11-01 DIAGNOSIS — I8500 Esophageal varices without bleeding: Secondary | ICD-10-CM | POA: Insufficient documentation

## 2020-11-01 DIAGNOSIS — Z79899 Other long term (current) drug therapy: Secondary | ICD-10-CM | POA: Diagnosis not present

## 2020-11-01 MED ORDER — FLUCONAZOLE 150 MG PO TABS: 150.0000 mg | ORAL_TABLET | ORAL | 0 refills | Status: DC

## 2020-11-01 NOTE — Progress Notes (Signed)
Gynecologic Oncology Consult Visit   Referring Provider: Dr Amalia Hailey  Chief Concern: chronic vulvar itching  Subjective:  Annette Foster is a 80 y.o. female who is seen in consultation from Dr. Amalia Hailey with vulvitis.  This patient is referred by Dr Amalia Hailey with several year history of chronic vulvar itching that is fairly intense. Several biopsies over the years and some have shown yeast.  She is diabetic.  No dysplasia, cancer, lichen sclerosis.     Treated with clobetasol and antifungals over the years.   07/04/16 PERIANAL AREA, LEFT  MILD PSORIASIFORM AND SPONGIOTIC DERMATITIS, CONSISTENT WITH MILD SUBACUTE ECZEMATOUS DERMATITIS   10/31/16 VULVA, RIGHT LABIA MAJORA, BIOPSY:  BENIGN VULVAR TISSUE WITH HYPERKERATOSIS, PARAKERATOSIS, ACANTHOSIS,  CHRONIC INFLAMMATION AND CANDIDIASIS.SPECIAL STAIN GMSF DEMONSTRATE FUNGAL HYPHE.   9/18 PERIANAL, LEFT:  BENIGN SKIN TISSUE WITH HYPERKERATOSIS, PARAKERATOSIS, ACANTHOSIS, ACUTE AND CHRONIC INFLAMMATION AND CANDIDIASIS. SPECIAL STAIN GMSF DEMONSTRATE FUNGAL HYPHE.   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   Abnormal EKG 04/05/2020   Allergic rhinitis due to pollen 02/24/2020   Chronic allergic conjunctivitis 02/24/2020   Allergic rhinitis due to animal (cat) (dog) hair and dander 02/24/2020   Allergic rhinitis 02/24/2020   Polyp of transverse colon    Hematochezia 02/17/2020   Chronic anticoagulation 02/17/2020   Anasarca 02/17/2020   Symptomatic sinus bradycardia 02/17/2020   PAF (paroxysmal atrial fibrillation) ([place]) 02/17/2020   AKI (acute kidney injury) ([place]) 02/17/2020   History of colitis 02/17/2020   Gastrointestinal hemorrhage    Atrial fibrillation with RVR ([place]) 02/07/2020   NASH (nonalcoholic  steatohepatitis)    Hav (hallux abducto valgus), unspecified laterality 06/03/2019   Acquired bilateral hammer toes 06/03/2019   Iron deficiency 04/26/2019   Right-sided chest wall pain 04/12/2019   Mid back pain on right side 04/12/2019   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 11/03/2017   Recurrent UTI (urinary tract infection) 07/23/2017   Nausea vomiting and diarrhea 01/11/2017   Hypothyroidism 01/11/2017   HTN (hypertension) 01/11/2017   Diabetes ([place]) 01/11/2017   GERD (gastroesophageal reflux disease) 01/11/2017   Other pancytopenia ([place]) 11/14/2016   Monilial vulvitis 11/06/2016   Spongiotic psoriasiform dermatitis 10/08/2016   Status post hysterectomy 06/20/2016   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   Vaginal atrophy 01/12/2016   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    Past Medical History: Past Medical History:  Diagnosis Date   Abdominal pain    Allergy    Cancer ([place]) 2017   breast cancer- Left   Cataract    CHF (congestive heart failure) ([place])    Collagenous colitis    Diabetes mellitus without complication ([place])    Diarrhea    Diverticulosis    Fatty liver disease, nonalcoholic    Fibrocystic breast    GERD (gastroesophageal reflux disease)    Heart murmur    Hyperlipidemia    Hypertension    Hypothyroidism    IBS (irritable bowel syndrome)    IDA (iron deficiency anemia)    Personal history of radiation therapy 2017   LEFT BREAST CA   PONV (postoperative nausea and vomiting)    Sleep apnea    C-Pap    Past Surgical History: Past Surgical History:  Procedure Laterality Date   ABDOMINAL  HYSTERECTOMY     tah bso   ABDOMINAL SURGERY     APPENDECTOMY     BREAST BIOPSY Bilateral 2016   negative   BREAST 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/2015   DCIS, papillary carcinoma in situ   BREAST BIOPSY Left 05/27/2016   BENIGN MAMMARY EPITHELIUM   BREAST BIOPSY Left 11/20/2017   affirm bx x clip BENIGN MAMMARY  EPITHELIUM CONSISTENT WITH RAD THERAPY   BREAST LUMPECTOMY Left 10/17/2015   DCIS and papillary carcinoma insitu, clear margins   CARDIAC SURGERY     has replacement valve   CATARACT EXTRACTION Right    CHOLECYSTECTOMY     COLONOSCOPY WITH PROPOFOL N/A 03/11/2016   Procedure: COLONOSCOPY WITH PROPOFOL;  Surgeon: Manya Silvas, MD;  Location: [facility] ENDOSCOPY;  Service: Endoscopy;  Laterality: N/A;   COLONOSCOPY WITH PROPOFOL N/A 02/18/2020   Procedure: COLONOSCOPY WITH PROPOFOL;  Surgeon: [name], MD;  Location: [facility] ENDOSCOPY;  Service: Endoscopy;  Laterality: N/A;   ESOPHAGOGASTRODUODENOSCOPY (EGD) WITH PROPOFOL N/A 03/11/2016   Procedure: ESOPHAGOGASTRODUODENOSCOPY (EGD) WITH PROPOFOL;  Surgeon: Manya Silvas, MD;  Location: [facility] ENDOSCOPY;  Service: Endoscopy;  Laterality: N/A;   ESOPHAGOGASTRODUODENOSCOPY (EGD) WITH PROPOFOL N/A 02/18/2020   Procedure: ESOPHAGOGASTRODUODENOSCOPY (EGD) WITH PROPOFOL;  Surgeon: [name], MD;  Location: [facility] ENDOSCOPY;  Service: Endoscopy;  Laterality: N/A;   ESOPHAGOGASTRODUODENOSCOPY (EGD) WITH PROPOFOL N/A 04/25/2020   Procedure: ESOPHAGOGASTRODUODENOSCOPY (EGD) WITH PROPOFOL;  Surgeon: [name], MD;  Location: [facility] ENDOSCOPY;  Service: Endoscopy;  Laterality: N/A;   ESOPHAGOGASTRODUODENOSCOPY (EGD) WITH PROPOFOL N/A 05/23/2020   Procedure: ESOPHAGOGASTRODUODENOSCOPY (EGD) WITH PROPOFOL;  Surgeon: [name], MD;  Location: [facility] ENDOSCOPY;  Service: Endoscopy;  Laterality: N/A;   FINGER ARTHROSCOPY WITH CARPOMETACARPEL ([place]) ARTHROPLASTY Right 09/03/2018   Procedure: CARPOMETACARPEL [facility]) ARTHROPLASTY RIGHT THUMB;  Surgeon: Hessie Knows, MD;  Location: [facility] ORS;  Service: Orthopedics;  Laterality: Right;   GANGLION CYST EXCISION Right 09/03/2018   Procedure: REMOVAL GANGLION OF WRIST;  Surgeon: Hessie Knows, MD;  Location: [facility] ORS;  Service: Orthopedics;  Laterality: Right;   HARDWARE REMOVAL Right 09/03/2018   Procedure: HARDWARE REMOVAL  RIGHT THUMB;  Surgeon: Hessie Knows, MD;  Location: [facility] ORS;  Service: Orthopedics;  Laterality: Right;  staple removed   JOINT REPLACEMENT Left    TKR   left sinusplasty      MASTECTOMY, PARTIAL Left 10/17/2015   Procedure: MASTECTOMY PARTIAL REVISION;  Surgeon: Leonie Green, MD;  Location: [facility] ORS;  Service: General;  Laterality: Left;   PARTIAL MASTECTOMY WITH NEEDLE LOCALIZATION Left 09/29/2015   Procedure: PARTIAL MASTECTOMY WITH NEEDLE LOCALIZATION;  Surgeon: Leonie Green, MD;  Location: [facility] ORS;  Service: General;  Laterality: Left;   TOTAL ABDOMINAL HYSTERECTOMY W/ BILATERAL SALPINGOOPHORECTOMY       OB History:  OB History  Gravida Para Term Preterm AB Living  2 2 2     2   SAB IAB Ectopic Multiple Live Births          2    # Outcome Date GA Lbr Len/2nd Weight Sex Delivery Anes PTL Lv  2 Term 1966    F Vag-Spont   LIV  1 Term 1963    F Vag-Spont   LIV    Family History: Family History  Problem Relation Age of Onset   Breast cancer Paternal Grandmother    Colon cancer Father    Diabetes Sister    Diabetes Brother    Heart disease Brother    Prostate cancer Brother    Colon cancer  Maternal Uncle    Prostate cancer Brother    Bladder Cancer Brother    Leukemia Mother        all   Ovarian cancer Neg Hx    Kidney cancer Neg Hx     Social History: Social History   Socioeconomic History   Marital status: Married    Spouse name: Not on file   Number of children: Not on file   Years of education: Not on file   Highest education level: Not on file  Occupational History   Not on file  Tobacco Use   Smoking status: Never   Smokeless tobacco: Never  Vaping Use   Vaping Use: Never used  Substance and Sexual Activity   Alcohol use: No    Alcohol/week: 0.0 standard drinks   Drug use: No   Sexual activity: Not Currently    Birth control/protection: Surgical  Other Topics Concern   Not on file  Social History Narrative   Not on file   Social  Determinants of Health   Financial Resource Strain: Not on file  Food Insecurity: Not on file  Transportation Needs: Not on file  Physical Activity: Not on file  Stress: Not on file  Social Connections: Not on file  Intimate Partner Violence: Not on file    Allergies: Allergies  Allergen Reactions   Codeine Itching and Nausea And Vomiting   Demeclocycline Rash   Demerol [Meperidine] Itching and Nausea And Vomiting   Hydrocodone Itching and Nausea And Vomiting   Oxycodone Itching and Nausea And Vomiting   Pentazocine Itching and Nausea And Vomiting   Tetracyclines & Related Rash   Fentanyl Itching and Nausea And Vomiting    D/W surgical nurse on 7/23 and pt reportedly tolerated dose during surgery.  Dose was given by MD.  Pt reported that she only gets itching and did not object to receiving med    Current Medications: Current Outpatient Medications  Medication Sig Dispense Refill   azelastine (OPTIVAR) 0.05 % ophthalmic solution daily as needed.     benzonatate (TESSALON) 200 MG capsule Take 200 mg by mouth 3 (three) times daily as needed for cough.     Cholecalciferol (VITAMIN D) 50 MCG (2000 UT) CAPS Take 2,000 Units by mouth daily.     clobetasol ointment (TEMOVATE) 3.15 % Apply 1 application topically 2 (two) times a week. 45 g 1   clotrimazole (LOTRIMIN) 1 % cream Apply 1 application topically 2 (two) times daily. Apply to irritated vulvar area twice daily. 60 g 6   cyanocobalamin (,VITAMIN B-12,) 1000 MCG/ML injection 1,000 mcg every 30 (thirty) days.     desloratadine (CLARINEX) 5 MG tablet Take 1 tablet by mouth daily.     dicyclomine (BENTYL) 10 MG capsule Take 10 mg by mouth at bedtime.      docusate sodium (COLACE) 100 MG capsule Take 1 capsule by mouth 2 (two) times daily as needed.     Dulaglutide (TRULICITY) 1.76 HY/0.7PX SOPN Inject 1.5 mg into the skin once a week.     EPINEPHrine 0.3 mg/0.3 mL IJ SOAJ injection Inject 0.3 mg into the muscle as needed.      escitalopram (LEXAPRO) 5 MG tablet Take 5 mg by mouth at bedtime.     esomeprazole (NEXIUM) 40 MG capsule Take 40 mg by mouth 2 (two) times daily before a meal.      estradiol (ESTRACE) 0.1 MG/GM vaginal cream Place 1 Applicatorful vaginally as needed.     fenofibrate 160  MG tablet Take 160 mg by mouth at bedtime.     fluconazole (DIFLUCAN) 150 MG tablet Take 1 tablet (150 mg total) by mouth every 3 (three) days. For three doses 6 tablet 1   fluticasone (FLONASE) 50 MCG/ACT nasal spray Place 1 spray into the nose daily as needed for allergies.     gabapentin (NEURONTIN) 300 MG capsule Take 300 mg by mouth at bedtime.      hydrOXYzine (ATARAX/VISTARIL) 25 MG tablet 1 tablet as needed     Hyoscyamine Sulfate 0.375 MG TBCR Take 0.375 mg by mouth 2 (two) times daily.      Insulin Degludec (TRESIBA) 100 UNIT/ML SOLN Inject 80 Units into the skin at bedtime.     levothyroxine (SYNTHROID, LEVOTHROID) 75 MCG tablet Take 75 mcg by mouth daily before breakfast.      magnesium oxide (MAG-OX) 400 MG tablet Take 400 mg by mouth 2 (two) times daily.      nebivolol (BYSTOLIC) 2.5 MG tablet Take 1 tablet (2.5 mg total) by mouth daily. 90 tablet 3   NOVOLOG FLEXPEN 100 UNIT/ML FlexPen Inject 15 Units into the skin 3 (three) times daily with meals. Based on sliding scale     ondansetron (ZOFRAN) 4 MG tablet Take 1 tablet (4 mg total) by mouth every 8 (eight) hours as needed for nausea or vomiting. 20 tablet 3   potassium chloride SA (K-DUR,KLOR-CON) 20 MEQ tablet Take 20 mEq by mouth 2 (two) times daily.      saccharomyces boulardii (FLORASTOR) 250 MG capsule Take 250 mg by mouth 2 (two) times daily.      simvastatin (ZOCOR) 20 MG tablet Take 20 mg by mouth at bedtime.     torsemide (DEMADEX) 20 MG tablet Take 1 tablet (20 mg total) by mouth daily. 90 tablet 0   valACYclovir (VALTREX) 500 MG tablet Take 500 mg by mouth See admin instructions. For fever blisters take 565m twice a day as needed  1   No current  facility-administered medications for this visit.    Review of Systems General: negative for, fevers, chills, fatigue, changes in sleep, changes in weight or appetite Skin: negative for changes in color, texture, moles or lesions Eyes: negative for, changes in vision, pain, diplopia HEENT: negative for, change in hearing, pain, discharge, tinnitus, vertigo, voice changes, sore throat, neck masses Pulmonary: negative for, dyspnea, orthopnea, productive cough Cardiac: negative for, palpitations, syncope, pain, discomfort, pressure Gastrointestinal: negative for, dysphagia, nausea, vomiting, jaundice, pain, constipation, diarrhea, hematemesis, hematochezia Genitourinary/Sexual: negative for, dysuria, discharge, hesitancy, nocturia, retention, stones, infections, STD's, incontinence Ob/Gyn: negative for, irregular bleeding, pain Musculoskeletal: negative for, pain, stiffness, swelling, range of motion limitation Hematology: negative for, easy bruising, bleeding Neurologic/Psych: negative for, headaches, seizures, paralysis, weakness, tremor, change in gait, change in sensation, mood swings, depression, anxiety, change in memory  Objective:  Physical Examination:  BP 113/74   Pulse (!) 125   Temp 97.8 F (36.6 C)   Resp 20   Wt 167 lb 6.4 oz (75.9 kg)   LMP  (LMP Unknown)   SpO2 100%   BMI 29.65 kg/m    ECOG Performance Status: 1 - Symptomatic but completely ambulatory  General appearance: alert, cooperative, and appears stated age HEENT:PERRLA and thyroid without masses Lymph node survey: non-palpable, axillary, inguinal, supraclavicular Cardiovascular: regular rate and rhythm, no murmurs or gallops Respiratory: normal air entry, lungs clear to auscultation and no rales, rhonchi or wheezing Abdomen: no hernias and well healed incision Back: inspection of back is  normal Extremities: extremities normal, atraumatic, no cyanosis or edema Skin exam - normal coloration and turgor, no  rashes, no suspicious skin lesions noted. Neurological exam reveals alert, oriented, normal speech, no focal findings or movement disorder noted.  Pelvic: exam chaperoned by nurse;  Vulva: vulvar skin is dull red with sharp border, suggestive of yeast infection.  This is symmetrical and goes around anal area too; Vagina: normal ;  Bimanual: no masses. Rectal: deferred    Assessment:  Annette Foster is a 80 y.o. female diagnosed with chronic vulvar itching. Several biopsies over the years and some have shown yeast.  She is diabetic.  No dysplasia, cancer, lichen sclerosis.   Exam today suggests chronic yeast infection.   Medical co-morbidities complicating care: diabetes.  Plan:   Problem List Items Addressed This Visit       Genitourinary   Chronic vulvitis - Primary   I discussed the case with [facility] colleagues Dr Doyle Askew of Dermatology and Dr Derald Macleod of [place]. Suggested regimen is Diflucan 150 mg qod for three doses. Clobetasol bid to vulva.  Stop all other interventions.  If not improved with this regimen would refer to Dr Merrilee Seashore in [facility] who has an interest in vulvar skin disorders.    The patient's diagnosis, an outline of the further diagnostic and laboratory studies which will be required, the recommendation, and alternatives were discussed.  All questions were answered to the patient's satisfaction.  A total of 30 minutes were spent with the patient/family today; 50% was spent in education, counseling and coordination of care for vulvar itching.    Mellody Drown, MD  CC:  Albina Billet, MD [address]   [place],  [place] [address] [phone]

## 2020-11-01 NOTE — Patient Instructions (Signed)
Increase our Clobetasol  cream to twice a day Diflucan has been sent to your pharmacy. Take 130m every other day for three doses. You may use Desitin cream for skin protection during the day and at night. Do not apply at the same time as Clobetasol. Fluconazole Tablets What is this medication? FLUCONAZOLE (floo KON na zole) prevents and treats fungal or yeast infections. It belongs to a group of medications called antifungals. It will not prevent or treat colds, the flu, or infections caused by bacteria or viruses. This medicine may be used for other purposes; ask your health care provider or pharmacist if you have questions. COMMON BRAND NAME(S): Diflucan What should I tell my care team before I take this medication? They need to know if you have any of these conditions: Irregular heartbeat or rhythm Kidney disease Liver disease Low levels of potassium in the blood An unusual or allergic reaction to fluconazole, other azole antifungals, medications, foods, dyes, or preservatives Pregnant or trying to get pregnant Breast-feeding How should I use this medication? Take this medication by mouth. Follow the directions on the prescription label. Do not take your medication more often than directed. Talk to your care team about the use of this medication in children. Special care may be needed. This medication has been used in children as young as 628months of age. Overdosage: If you think you have taken too much of this medicine contact a poison control center or emergency room at once. NOTE: This medicine is only for you. Do not share this medicine with others. What if I miss a dose? If you miss a dose, take it as soon as you can. If it is almost time for your next dose, take only that dose. Do not take double or extra doses. What may interact with this medication? Do not take this medication with any of the following medications: Flibanserin Lomitapide Lonafarnib Other medications that  prolong the QT interval (cause an abnormal heart rhythm) Triazolam This medication may also interact with the following medications: Certain antibiotics like rifabutin, rifampin Certain antivirals for HIV or hepatitis Certain medications for blood pressure, heart disease, irregular heartbeat Certain medications for cholesterol like atorvastatin, lovastatin, and simvastatin Certain medications for depression, like amitriptyline, nortriptyline Certain medications for diabetes like glipizide or glyburide Certain medications for seizures like carbamazepine, phenytoin Certain medications that treat or prevent blood clots like warfarin Certain narcotic medications for pain like alfentanil, fentanyl, methadone Cyclophosphamide Cyclosporine Ibrutinib Lemborexant Midazolam NSAIDS, medications for pain and inflammation, like ibuprofen or naproxen Olaparib Sirolimus Steroid medications like prednisone Tacrolimus Theophylline Tofacitinib Tolvaptan Vinblastine Vincristine Vitamin A Voriconazole This list may not describe all possible interactions. Give your health care provider a list of all the medicines, herbs, non-prescription drugs, or dietary supplements you use. Also tell them if you smoke, drink alcohol, or use illegal drugs. Some items may interact with your medicine. What should I watch for while using this medication? Visit your care team for regular checkups. If you are taking this medication for a long time you may need blood work. Tell your care team if your symptoms do not improve. Some fungal infections need many weeks or months of treatment to cure. Alcohol can increase possible damage to your liver. Avoid alcoholic drinks. If you have a vaginal infection, do not have sex until you have finished your treatment. You can wear a sanitary napkin. Do not use tampons. Wear freshly washed cotton, not synthetic, panties. What side effects may I notice from receiving  this  medication? Side effects that you should report to your care team as soon as possible: Allergic reactions-skin rash, itching, hives, swelling of the face, lips, tongue, or throat Heart rhythm changes-fast or irregular heartbeat, dizziness, feeling faint or lightheaded, chest pain, trouble breathing Liver injury-right upper belly pain, loss of appetite, nausea, light-colored stool, dark yellow or brown urine, yellowing skin or eyes, unusual weakness or fatigue Low adrenal gland function-nausea, vomiting, loss of appetite, unusual weakness or fatigue, dizziness Rash, fever, and swollen lymph nodes Redness, blistering, peeling, or loosening of the skin, including inside the mouth Seizures Side effects that usually do not require medical attention (report to your care team if they continue or are bothersome): Change in taste Diarrhea Dizziness Headache Nausea Stomach pain This list may not describe all possible side effects. Call your doctor for medical advice about side effects. You may report side effects to FDA at 1-800-FDA-1088. Where should I keep my medication? Keep out of the reach of children. Store at room temperature below 30 degrees C (86 degrees F). Throw away any medication after the expiration date. NOTE: This sheet is a summary. It may not cover all possible information. If you have questions about this medicine, talk to your doctor, pharmacist, or health care provider.  2022 Elsevier/Gold Standard (2020-04-13 16:15:26)

## 2020-11-06 ENCOUNTER — Encounter: Payer: Self-pay | Admitting: Internal Medicine

## 2020-11-30 ENCOUNTER — Encounter: Payer: Self-pay | Admitting: Internal Medicine

## 2020-11-30 ENCOUNTER — Other Ambulatory Visit: Payer: Self-pay

## 2020-11-30 ENCOUNTER — Ambulatory Visit (INDEPENDENT_AMBULATORY_CARE_PROVIDER_SITE_OTHER): Payer: Medicare PPO | Admitting: Internal Medicine

## 2020-11-30 VITALS — BP 124/60 | HR 49 | Ht 63.0 in | Wt 171.0 lb

## 2020-11-30 DIAGNOSIS — E1169 Type 2 diabetes mellitus with other specified complication: Secondary | ICD-10-CM

## 2020-11-30 DIAGNOSIS — I851 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 bleeding: Secondary | ICD-10-CM

## 2020-11-30 DIAGNOSIS — I5032 Chronic diastolic (congestive) heart failure: Secondary | ICD-10-CM

## 2020-11-30 DIAGNOSIS — I480 Paroxysmal atrial fibrillation: Secondary | ICD-10-CM | POA: Diagnosis not present

## 2020-11-30 DIAGNOSIS — K7460 Unspecified cirrhosis of liver: Secondary | ICD-10-CM | POA: Diagnosis not present

## 2020-11-30 DIAGNOSIS — E785 Hyperlipidemia, unspecified: Secondary | ICD-10-CM

## 2020-11-30 MED ORDER — NADOLOL 20 MG PO TABS: 10.0000 mg | ORAL_TABLET | Freq: Every day | ORAL | 2 refills | Status: DC

## 2020-11-30 MED ORDER — SPIRONOLACTONE 25 MG PO TABS: 12.5000 mg | ORAL_TABLET | Freq: Every day | ORAL | 2 refills | Status: DC

## 2020-11-30 NOTE — Progress Notes (Signed)
Follow-up Outpatient Visit Date: 11/30/2020  Primary Care Provider: Albina Billet, MD [address] [place] [address]  Chief Complaint: Follow-up atrial fibrillation and HFpEF  HPI:  Annette Foster is a 80 y.o. female with history of remote valve surgery in the 1970s, HFpEF, paroxysmal atrial fibrillation complicated by GI bleed on anticoagulation, hypertension, hyperlipidemia, NASH, seizure disorder, OSA, and left breast cancer status post mastectomy, who presents for follow-up of atrial fibrillation and valvular heart disease.  I last saw her in early September, at which time [name] complained of increasing palpitations and variable heart rates.  She saw Dr. Quentin Ore the following week to discuss watchman placement.  Unfortunately, her left atrial appendage morphology precludes watchman placement.  Anticoagulation was not pursued given history of GI bleeding with esophageal varices.  No comment was made on further management of atrial fibrillation with tachybradycardia syndrome.  Today, [name] reports that she has been feeling fairly well though she continues to have intermittent palpitations with associated heart rates up to 120 bpm.  She is most aware of this at night when she is in bed.  There are no associated symptoms, with episodes usually only lasting a few minutes.  She otherwise notes that her heart rates are typically around 50 bpm.  She denies chest pain, shortness of breath, and lightheadedness.  She had some edema in her calves and feet last weekend, which has improved but not resolved.  She has been compliant with her medications, including torsemide.  She also reports putting on 6 pounds in the last few weeks, which has been accompanied by some abdominal distention.  She has had a couple dark stools but no bright red blood per rectum.  She is due for follow-up with Dr. Allen Norris in the GI clinic in about 3 weeks.  She notes a mechanical fall while visiting her husband at  the [facility] a few weeks ago.  Facial lacerations were closed with Dermabond.  --------------------------------------------------------------------------------------------------  Past Medical History:  Diagnosis Date   Abdominal pain    Allergy    Cancer ([place]) 2017   breast cancer- Left   Cataract    CHF (congestive heart failure) (HCC)    Collagenous colitis    Diabetes mellitus without complication (HCC)    Diarrhea    Diverticulosis    Fatty liver disease, nonalcoholic    Fibrocystic breast    GERD (gastroesophageal reflux disease)    Heart murmur    Hyperlipidemia    Hypertension    Hypothyroidism    IBS (irritable bowel syndrome)    IDA (iron deficiency anemia)    Personal history of radiation therapy 2017   LEFT BREAST CA   PONV (postoperative nausea and vomiting)    Sleep apnea    C-Pap   Past Surgical History:  Procedure Laterality Date   ABDOMINAL HYSTERECTOMY     [place] bso   ABDOMINAL SURGERY     APPENDECTOMY     BREAST BIOPSY Bilateral 2016   negative   BREAST 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/2015   DCIS, papillary carcinoma in situ   BREAST BIOPSY Left 05/27/2016   BENIGN MAMMARY EPITHELIUM   BREAST BIOPSY Left 11/20/2017   affirm bx x clip BENIGN MAMMARY EPITHELIUM CONSISTENT WITH RAD THERAPY   BREAST LUMPECTOMY Left 10/17/2015   DCIS and papillary carcinoma insitu, clear margins   CARDIAC SURGERY     has replacement valve   CATARACT EXTRACTION Right    CHOLECYSTECTOMY  COLONOSCOPY WITH PROPOFOL N/A 03/11/2016   Procedure: COLONOSCOPY WITH PROPOFOL;  Surgeon: Manya Silvas, MD;  Location: [facility] ENDOSCOPY;  Service: Endoscopy;  Laterality: N/A;   COLONOSCOPY WITH PROPOFOL N/A 02/18/2020   Procedure: COLONOSCOPY WITH PROPOFOL;  Surgeon: [name], MD;  Location: [facility] ENDOSCOPY;  Service: Endoscopy;  Laterality: N/A;   ESOPHAGOGASTRODUODENOSCOPY (EGD) WITH PROPOFOL N/A 03/11/2016   Procedure: ESOPHAGOGASTRODUODENOSCOPY (EGD) WITH PROPOFOL;  Surgeon: Manya Silvas, MD;  Location: [facility] ENDOSCOPY;  Service: Endoscopy;  Laterality: N/A;   ESOPHAGOGASTRODUODENOSCOPY (EGD) WITH PROPOFOL N/A 02/18/2020   Procedure: ESOPHAGOGASTRODUODENOSCOPY (EGD) WITH PROPOFOL;  Surgeon: [name], MD;  Location: [facility] ENDOSCOPY;  Service: Endoscopy;  Laterality: N/A;   ESOPHAGOGASTRODUODENOSCOPY (EGD) WITH PROPOFOL N/A 04/25/2020   Procedure: ESOPHAGOGASTRODUODENOSCOPY (EGD) WITH PROPOFOL;  Surgeon: [name], MD;  Location: [facility] ENDOSCOPY;  Service: Endoscopy;  Laterality: N/A;   ESOPHAGOGASTRODUODENOSCOPY (EGD) WITH PROPOFOL N/A 05/23/2020   Procedure: ESOPHAGOGASTRODUODENOSCOPY (EGD) WITH PROPOFOL;  Surgeon: [name], MD;  Location: [facility] ENDOSCOPY;  Service: Endoscopy;  Laterality: N/A;   FINGER ARTHROSCOPY WITH CARPOMETACARPEL ([place]) ARTHROPLASTY Right 09/03/2018   Procedure: CARPOMETACARPEL [facility]) ARTHROPLASTY RIGHT THUMB;  Surgeon: Hessie Knows, MD;  Location: [facility] ORS;  Service: Orthopedics;  Laterality: Right;   GANGLION CYST EXCISION Right 09/03/2018   Procedure: REMOVAL GANGLION OF WRIST;  Surgeon: Hessie Knows, MD;  Location: [facility] ORS;  Service: Orthopedics;  Laterality: Right;   HARDWARE REMOVAL Right 09/03/2018   Procedure: HARDWARE REMOVAL RIGHT THUMB;  Surgeon: Hessie Knows, MD;  Location: [facility] ORS;  Service: Orthopedics;  Laterality: Right;  staple removed   JOINT REPLACEMENT Left    TKR   left sinusplasty      MASTECTOMY, PARTIAL Left 10/17/2015   Procedure: MASTECTOMY PARTIAL REVISION;  Surgeon: Leonie Green, MD;  Location: [facility] ORS;  Service: General;  Laterality: Left;   PARTIAL MASTECTOMY WITH NEEDLE LOCALIZATION Left 09/29/2015   Procedure: PARTIAL MASTECTOMY WITH NEEDLE LOCALIZATION;  Surgeon: Leonie Green, MD;  Location: [facility] ORS;  Service: General;  Laterality: Left;   TOTAL ABDOMINAL HYSTERECTOMY W/ BILATERAL SALPINGOOPHORECTOMY      Current Meds  Medication Sig   azelastine (OPTIVAR) 0.05 % ophthalmic solution daily as  needed.   benzonatate (TESSALON) 200 MG capsule Take 200 mg by mouth 3 (three) times daily as needed for cough.   Cholecalciferol (VITAMIN D) 50 MCG (2000 UT) CAPS Take 2,000 Units by mouth daily.   clobetasol ointment (TEMOVATE) 5.42 % 1 application two days per week PRN   clotrimazole (LOTRIMIN) 1 % cream Apply 1 application topically 2 (two) times daily. Apply to irritated vulvar area twice daily.   cyanocobalamin (,VITAMIN B-12,) 1000 MCG/ML injection 1,000 mcg every 30 (thirty) days.   desloratadine (CLARINEX) 5 MG tablet Take 1 tablet by mouth daily.   dicyclomine (BENTYL) 10 MG capsule Take 10 mg by mouth at bedtime.    docusate sodium (COLACE) 100 MG capsule Take 1 capsule by mouth 2 (two) times daily as needed.   Dulaglutide (TRULICITY) 7.06 CB/7.6EG SOPN Inject 1.5 mg into the skin once a week.   EPINEPHrine 0.3 mg/0.3 mL IJ SOAJ injection Inject 0.3 mg into the muscle as needed.   escitalopram (LEXAPRO) 5 MG tablet Take 5 mg by mouth at bedtime.   esomeprazole (NEXIUM) 40 MG capsule Take 40 mg by mouth 2 (two) times daily before a meal.    estradiol (ESTRACE) 0.1 MG/GM vaginal cream Place 1 Applicatorful vaginally as needed.   fenofibrate 160 MG tablet Take 160 mg  by mouth at bedtime.   fluconazole (DIFLUCAN) 150 MG tablet 1 tablet every 3 days PRN   fluticasone (FLONASE) 50 MCG/ACT nasal spray Place 1 spray into the nose daily as needed for allergies.   gabapentin (NEURONTIN) 300 MG capsule Take 300 mg by mouth at bedtime.    hydrOXYzine (ATARAX/VISTARIL) 25 MG tablet 1 tablet as needed   Hyoscyamine Sulfate 0.375 MG TBCR Take 0.375 mg by mouth 2 (two) times daily.    Insulin Degludec (TRESIBA) 100 UNIT/ML SOLN Inject 110 Units into the skin at bedtime.   levothyroxine (SYNTHROID, LEVOTHROID) 75 MCG tablet Take 75 mcg by mouth daily before breakfast.    magnesium oxide (MAG-OX) 400 MG tablet Take 400 mg by mouth 2 (two) times daily.    nadolol (CORGARD) 20 MG tablet Take 0.5  tablets (10 mg total) by mouth daily.   NOVOLOG FLEXPEN 100 UNIT/ML FlexPen Inject 15 Units into the skin 3 (three) times daily with meals. Based on sliding scale   ondansetron (ZOFRAN) 4 MG tablet Take 1 tablet (4 mg total) by mouth every 8 (eight) hours as needed for nausea or vomiting.   potassium chloride SA (K-DUR,KLOR-CON) 20 MEQ tablet Take 20 mEq by mouth 2 (two) times daily.    saccharomyces boulardii (FLORASTOR) 250 MG capsule Take 250 mg by mouth 2 (two) times daily.    simvastatin (ZOCOR) 20 MG tablet Take 20 mg by mouth at bedtime.   spironolactone (ALDACTONE) 25 MG tablet Take 0.5 tablets (12.5 mg total) by mouth daily.   torsemide (DEMADEX) 20 MG tablet Take 1 tablet (20 mg total) by mouth daily.   valACYclovir (VALTREX) 500 MG tablet Take 500 mg by mouth See admin instructions. For fever blisters take 575m twice a day as needed   [DISCONTINUED] nebivolol (BYSTOLIC) 2.5 MG tablet Take 1 tablet (2.5 mg total) by mouth daily.    Allergies: Codeine, Demeclocycline, Demerol [meperidine], Hydrocodone, Oxycodone, Pentazocine, Tetracyclines & related, and Fentanyl  Social History   Tobacco Use   Smoking status: Never   Smokeless tobacco: Never  Vaping Use   Vaping Use: Never used  Substance Use Topics   Alcohol use: No    Alcohol/week: 0.0 standard drinks   Drug use: No    Family History  Problem Relation Age of Onset   Breast cancer Paternal Grandmother    Colon cancer Father    Diabetes Sister    Diabetes Brother    Heart disease Brother    Prostate cancer Brother    Colon cancer Maternal Uncle    Prostate cancer Brother    Bladder Cancer Brother    Leukemia Mother        all   Ovarian cancer Neg Hx    Kidney cancer Neg Hx     Review of Systems: A 12-system review of systems was performed and was negative except as noted in the HPI.  --------------------------------------------------------------------------------------------------  Physical Exam: BP 124/60  (BP Location: Right Arm, Patient Position: Sitting, Cuff Size: Normal)   Pulse (!) 49   Ht 5' 3"  (1.6 m)   Wt 171 lb (77.6 kg)   LMP  (LMP Unknown)   SpO2 98%   BMI 30.29 kg/m   General:  NAD. Neck: No JVD or HJR. Lungs: Clear to auscultation bilaterally without wheezes or crackles. Heart: Bradycardic but regular with 2/6 systolic murmur.  No rubs or gallops. Abdomen: Soft, nontender, nondistended. Extremities: Trace ankle edema bilaterally  EKG: Sinus bradycardia with anterolateral and inferior T wave inversions.  Compared with prior tracing from 10/18/2020, sinus bradycardia has replaced atrial flutter with 2:1 AV block.  Lab Results  Component Value Date   WBC 2.6 (L) 09/12/2020   HGB 13.5 09/12/2020   HCT 40.7 09/12/2020   MCV 90.8 09/12/2020   PLT 87 (L) 09/12/2020    Lab Results  Component Value Date   NA 131 (L) 09/12/2020   K 3.8 09/12/2020   CL 90 (L) 09/12/2020   CO2 31 09/12/2020   BUN 23 09/12/2020   CREATININE 0.90 09/12/2020   GLUCOSE 397 (H) 09/12/2020   ALT 19 09/12/2020    No results found for: CHOL, HDL, LDLCALC, LDLDIRECT, TRIG, CHOLHDL  --------------------------------------------------------------------------------------------------  ASSESSMENT AND PLAN: Paroxysmal atrial fibrillation: Ms. Mordecai is in sinus rhythm today but continues to have palpitations and elevated heart rates consistent with paroxysmal atrial fibrillation/flutter.  Fortunately, these episodes are largely asymptomatic and short-lived.  She is currently only on low-dose nebivolol and is quite bradycardic today.  We will transition from nebivolol to nadolol 10 mg daily, given its beneficial profile in regard to her cirrhosis and esophageal varices.  Anticoagulation remains contraindicated with her history of GI bleeds.  She is also not a candidate for watchman.  I will reach out to Dr. Quentin Ore to see if he thinks there is any utility in considering pacemaker placement in the setting of  tachybradycardia syndrome.  Chronic HFpEF: Ms. Maske appears largely euvolemic on exam today but reports more leg edema recently as well as a 6 pound weight gain.  We will continue her current dose of torsemide and add spironolactone 12.5 mg daily.  I will check a CMP today.  Cirrhosis and esophageal varices: Ms. Quarry notes a few black stools but otherwise no bleeding.  I will check a CBC today.  If hemoglobin is stable, I think it is reasonable for her to follow-up as scheduled with Dr. Allen Norris.  If hemoglobin has declined, she will need more urgent evaluation.  As above, we will switch from nebivolol to nadolol.  Hyperlipidemia associated with type 2 diabetes mellitus: Patient on statin therapy chronically with history of diabetes but no established ASCVD.  We will check a lipid panel today to help guide decision about continued statin therapy and dosing.  Follow-up: Return to clinic in 6 weeks.  Nelva Bush, MD 11/30/2020 1:33 PM

## 2020-11-30 NOTE — Patient Instructions (Signed)
Medication Instructions:  - Your physician has recommended you make the following change in your medication:   1) STOP Nebivolol  2) START Nadolol 20 mg- take 0.5 tablet (10 mg) by mouth once daily   3) START Spironolactone 25 mg- take 0.5 tablet (12.5 mg) by mouth once daily    *If you need a refill on your cardiac medications before your next appointment, please call your pharmacy*   Lab Work: - Your physician recommends that you have lab work today: CBC/ CMET/ Lipid  If you have labs (blood work) drawn today and your tests are completely normal, you will receive your results only by: [employer] (if you have [place]) OR A paper copy in the mail If you have any lab test that is abnormal or we need to change your treatment, we will call you to review the results.   Testing/Procedures: - none ordered   Follow-Up: At [facility], you and your health needs are our priority.  As part of our continuing mission to provide you with exceptional heart care, we have created designated Provider Care Teams.  These Care Teams include your primary Cardiologist (physician) and Advanced Practice Providers (APPs -  Physician Assistants and Nurse Practitioners) who all work together to provide you with the care you need, when you need it.  We recommend signing up for the patient portal called "MyChart".  Sign up information is provided on this After Visit Summary.  MyChart is used to connect with patients for Virtual Visits (Telemedicine).  Patients are able to view lab/test results, encounter notes, upcoming appointments, etc.  Non-urgent messages can be sent to your provider as well.   To learn more about what you can do with MyChart, go to [email or web address].    Your next appointment:   6 week(s)  The format for your next appointment:   In Person  Provider:   You may see Nelva Bush, MD or one of the following Advanced Practice Providers on your designated Care Team:    Murray Hodgkins, NP Christell Faith, PA-C Marrianne Mood, PA-C Cadence Kathlen Mody, [place]   Other Instructions  Nadolol Tablets What is this medication? NADOLOL (nay DOE lole) is a beta blocker. It decreases the amount of work your heart has to do and helps your heart beat regularly. It treats high blood pressure and/or prevent chest pain (also called angina). This medicine may be used for other purposes; ask your health care provider or pharmacist if you have questions. COMMON BRAND NAME(S): Corgard What should I tell my care team before I take this medication? They need to know if you have any of these conditions: diabetes heart or vessel disease like slow heart rate, worsening heart failure, heart block, sick sinus syndrome or Raynaud's disease kidney disease lung or breathing disease, like asthma or emphysema pheochromocytoma thyroid disease an unusual or allergic reaction to nadolol, other beta-blockers, medicines, foods, dyes, or preservatives pregnant or trying to get pregnant breast-feeding How should I use this medication? Take this drug by mouth. Take it as directed on the prescription label at the same time every day. You can take it with or without food. If it upsets your stomach, take it with food. Keep taking it unless your health care provider tells you to stop. Talk to your health care provider about the use of this drug in children. Special care may be needed. Overdosage: If you think you have taken too much of this medicine contact a poison control center or emergency room at  once. NOTE: This medicine is only for you. Do not share this medicine with others. What if I miss a dose? If you miss a dose, take it as soon as you can. If it is almost time for your next dose, take only that dose. Do not take double or extra doses. What may interact with this medication? This medicine may interact with the following medications: certain medicines for blood pressure, heart disease,  irregular heart beat certain medicines for diabetes, like glipizide or glyburide general anesthetics medicines used to treat allergic reactions like epinephrine This list may not describe all possible interactions. Give your health care provider a list of all the medicines, herbs, non-prescription drugs, or dietary supplements you use. Also tell them if you smoke, drink alcohol, or use illegal drugs. Some items may interact with your medicine. What should I watch for while using this medication? Visit your doctor or health care professional for regular check ups. Check your blood pressure and pulse rate regularly. Ask your health care professional what your blood pressure and pulse rate should be, and when you should contact them. You may get drowsy or dizzy. Do not drive, use machinery, or do anything that needs mental alertness until you know how this drug affects you. Do not stand or sit up quickly, especially if you are an older patient. This reduces the risk of dizzy or fainting spells. Alcohol can make you more drowsy and dizzy. Avoid alcoholic drinks. This medicine may increase blood sugar. Ask your healthcare provider if changes in diet or medicines are needed if you have diabetes. Do not treat yourself for coughs, colds, or pain while you are taking this medicine without asking your doctor or health care professional for advice. Some ingredients may increase your blood pressure. What side effects may I notice from receiving this medication? Side effects that you should report to your doctor or health care professional as soon as possible: allergic reactions like skin rash, itching or hives cold, tingling, or numb hands or feet difficulty breathing, wheezing irregular heart beat mental depression  signs and symptoms of high blood sugar such as being more thirsty or hungry or having to urinate more than normal. You may also feel very tired or have blurry vision. slow heart rate swelling of  the legs or ankles Side effects that usually do not require medical attention (report to your doctor or health care professional if they continue or are bothersome): change in sex drive or performance dry itching skin headache This list may not describe all possible side effects. Call your doctor for medical advice about side effects. You may report side effects to FDA at 1-800-FDA-1088. Where should I keep my medication? Keep out of the reach of children and pets. Store at room temperature between 20 and 25 degrees C (68 and 77 degrees F). Protect from light. Keep the container tightly closed. Avoid exposure to extreme heat. Throw away any unused drug after the expiration date. NOTE: This sheet is a summary. It may not cover all possible information. If you have questions about this medicine, talk to your doctor, pharmacist, or health care provider.  2022 Elsevier/Gold Standard (2018-09-04 18:20:05)   Spironolactone Tablets What is this medication? SPIRONOLACTONE ([place] LAK tone) treats high blood pressure and heart failure. It may also be used to reduce swelling related to heart, kidney, or liver disease. It helps your kidneys remove more fluid and salt from your blood through the urine without losing too much potassium. It  belongs to a group of medications called diuretics. This medicine may be used for other purposes; ask your health care provider or pharmacist if you have questions. COMMON BRAND NAME(S): Aldactone What should I tell my care team before I take this medication? They need to know if you have any of these conditions: Addison's disease or low adrenal gland function High blood level of potassium Kidney disease Liver disease An unusual or allergic reaction to spironolactone, other medications, foods, dyes, or preservatives Pregnant or trying to get pregnant Breast-feeding How should I use this medication? Take this medication by mouth. Take it as directed on the  prescription label at the same time every day. You can take it with or without food. You should always take it the same way. Keep taking it unless your care team tells you to stop. Talk to your care team about the use of this medication in children. Special care may be needed. Overdosage: If you think you have taken too much of this medicine contact a poison control center or emergency room at once. NOTE: This medicine is only for you. Do not share this medicine with others. What if I miss a dose? If you miss a dose, take it as soon as you can. If it is almost time for your next dose, take only that dose. Do not take double or extra doses. What may interact with this medication? Do not take this medication with any of the following: Cidofovir Eplerenone Tranylcypromine This medication may also interact with the following: Aspirin Certain medications for blood pressure or heart disease like benazepril, lisinopril, losartan, valsartan Certain medications that treat or prevent blood clots like heparin and enoxaparin Cholestyramine Cyclosporine Digoxin Lithium Medications that relax muscles for surgery NSAIDs, medications for pain and inflammation, like ibuprofen or naproxen Other diuretics Potassium salts or supplements Steroid medications like prednisone or cortisone Trimethoprim This list may not describe all possible interactions. Give your health care provider a list of all the medicines, herbs, non-prescription drugs, or dietary supplements you use. Also tell them if you smoke, drink alcohol, or use illegal drugs. Some items may interact with your medicine. What should I watch for while using this medication? Visit your care team for regular checks on your progress. Check your blood pressure as directed. Ask your care team what your blood pressure should be. Also, find out when you should contact him or her. Do not treat yourself for coughs, colds, or pain while you are using this  medication without asking your care team for advice. Some medications may increase your blood pressure. Check with your care team if you have severe diarrhea, nausea, and vomiting, or if you sweat a lot. The loss of too much body fluid may make it dangerous for you to take this medication. You may need to be on a special diet while taking this medication. Ask your care team. Also, find out how many glasses of fluid you need to drink each day. You may get drowsy or dizzy. Do not drive, use machinery, or do anything that needs mental alertness until you know how this medication affects you. Do not stand or sit up quickly, especially if you are an older patient. This reduces the risk of dizzy or fainting spells. Alcohol may interfere with the effects of this medication. Avoid alcoholic drinks. Avoid salt substitutes unless you are told otherwise by your care team. What side effects may I notice from receiving this medication? Side effects that you should report to your  care team as soon as possible: Allergic reactions-skin rash, itching, hives, swelling of the face, lips, tongue, or throat Dehydration-increased thirst, dry mouth, feeling faint or lightheaded, headache, dark yellow or brown urine High potassium level-muscle weakness, fast or irregular heartbeat Kidney injury-decrease in the amount of urine, swelling of the ankles, hands, or feet Low blood pressure-dizziness, feeling faint or lightheaded, blurry vision Low sodium level-muscle weakness, fatigue, dizziness, headache, confusion Side effects that usually do not require medical attention (report to your care team if they continue or are bothersome): Breast pain or tenderness Changes in sex drive or performance Dizziness Headache Irregular menstrual cycles or spotting Unexpected breast tissue growth This list may not describe all possible side effects. Call your doctor for medical advice about side effects. You may report side effects to  FDA at 1-800-FDA-1088. Where should I keep my medication? Keep out of the reach of children and pets. Store below 25 degrees C (77 degrees F). Get rid of any unused medication after the expiration date. To get rid of medications that are no longer needed or have expired: Take the medication to a medication take-back program. Check with your pharmacy or law enforcement to find a location. If you cannot return the medication, check the label or package insert to see if the medication should be thrown out in the garbage or flushed down the toilet. If you are not sure, ask your care team. If it is safe to put into the trash, take the medication out of the container. Mix the medication with cat litter, dirt, coffee grounds, or other unwanted substance. Seal the mixture in a bag or container. Put it in the trash. NOTE: This sheet is a summary. It may not cover all possible information. If you have questions about this medicine, talk to your doctor, pharmacist, or health care provider.  2022 Elsevier/Gold Standard (2020-04-27 13:01:04)

## 2020-12-01 ENCOUNTER — Telehealth: Payer: Self-pay | Admitting: *Deleted

## 2020-12-01 LAB — COMPREHENSIVE METABOLIC PANEL
ALT: 8 IU/L (ref 0–32)
AST: 22 IU/L (ref 0–40)
Albumin/Globulin Ratio: 1.2 (ref 1.2–2.2)
Albumin: 3.8 g/dL (ref 3.7–4.7)
Alkaline Phosphatase: 51 IU/L (ref 44–121)
BUN/Creatinine Ratio: 13 (ref 12–28)
BUN: 10 mg/dL (ref 8–27)
Bilirubin Total: 0.8 mg/dL (ref 0.0–1.2)
CO2: 28 mmol/L (ref 20–29)
Calcium: 8.7 mg/dL (ref 8.7–10.3)
Chloride: 98 mmol/L (ref 96–106)
Creatinine, Ser: 0.78 mg/dL (ref 0.57–1.00)
Globulin, Total: 3.3 g/dL (ref 1.5–4.5)
Glucose: 260 mg/dL — ABNORMAL HIGH (ref 70–99)
Potassium: 4.3 mmol/L (ref 3.5–5.2)
Sodium: 137 mmol/L (ref 134–144)
Total Protein: 7.1 g/dL (ref 6.0–8.5)
eGFR: 77 mL/min/{1.73_m2} (ref >59)

## 2020-12-01 LAB — CBC
Hematocrit: 33.7 % — ABNORMAL LOW (ref 34.0–46.6)
Hemoglobin: 11.1 g/dL (ref 11.1–15.9)
MCH: 30 pg (ref 26.6–33.0)
MCHC: 32.9 g/dL (ref 31.5–35.7)
MCV: 91 fL (ref 79–97)
Platelets: 67 10*3/uL — CL (ref 150–450)
RBC: 3.7 x10E6/uL — ABNORMAL LOW (ref 3.77–5.28)
RDW: 13.6 % (ref 11.7–15.4)
WBC: 1.5 10*3/uL — CL (ref 3.4–10.8)

## 2020-12-01 LAB — LIPID PANEL
Chol/HDL Ratio: 6.2 ratio — ABNORMAL HIGH (ref 0.0–4.4)
Cholesterol, Total: 160 mg/dL (ref 100–199)
HDL: 26 mg/dL — ABNORMAL LOW (ref >39)
LDL Chol Calc (NIH): 106 mg/dL — ABNORMAL HIGH (ref 0–99)
Triglycerides: 156 mg/dL — ABNORMAL HIGH (ref 0–149)
VLDL Cholesterol Cal: 28 mg/dL (ref 5–40)

## 2020-12-01 NOTE — Telephone Encounter (Signed)
Reviewed results and recommendations with patient. Advised these results were also forwarded to Dr. Rogue Bussing and Dr. Eliberto Sole Norris as well. She verbalized understanding of our conversation, results reviewed, and no further questions at this time.

## 2020-12-01 NOTE — Telephone Encounter (Signed)
Left voicemail message to call back for review of her results.

## 2020-12-01 NOTE — Telephone Encounter (Signed)
Patient calling to discuss recent testing results  ° °Please call  ° °

## 2020-12-01 NOTE — Telephone Encounter (Signed)
-----   Message from Nelva Bush, MD sent at 12/01/2020  6:42 AM EDT ----- Please let Ms. Brathwaite know that her hemoglobin remains normal but is down slightly compared with 2 months ago.  Her platelets and white blood cell count are both slightly lower than in the past as well.  Kidney function, liver funtion, and electrolytes are ok.  Cholesterol is mildly elevated.  I recommend that she continue her current medications.  I will forward the results to Drs. Rogue Bussing and [place] for their records as well.

## 2020-12-05 ENCOUNTER — Other Ambulatory Visit: Payer: Self-pay | Admitting: Medical

## 2020-12-13 ENCOUNTER — Other Ambulatory Visit: Payer: Self-pay | Admitting: General Surgery

## 2020-12-13 DIAGNOSIS — Z853 Personal history of malignant neoplasm of breast: Secondary | ICD-10-CM

## 2020-12-13 DIAGNOSIS — Z1231 Encounter for screening mammogram for malignant neoplasm of breast: Secondary | ICD-10-CM

## 2020-12-14 ENCOUNTER — Ambulatory Visit: Payer: Medicare PPO | Admitting: Podiatry

## 2020-12-27 ENCOUNTER — Other Ambulatory Visit: Payer: Self-pay | Admitting: Gastroenterology

## 2020-12-27 ENCOUNTER — Other Ambulatory Visit: Payer: Self-pay | Admitting: Medical

## 2021-01-09 ENCOUNTER — Encounter: Payer: Self-pay | Admitting: Ophthalmology

## 2021-01-10 ENCOUNTER — Encounter: Payer: Self-pay | Admitting: Internal Medicine

## 2021-01-11 NOTE — Discharge Instructions (Signed)

## 2021-01-12 ENCOUNTER — Inpatient Hospital Stay (HOSPITAL_BASED_OUTPATIENT_CLINIC_OR_DEPARTMENT_OTHER): Payer: Medicare PPO | Admitting: Internal Medicine

## 2021-01-12 ENCOUNTER — Other Ambulatory Visit: Payer: Self-pay

## 2021-01-12 ENCOUNTER — Encounter: Payer: Self-pay | Admitting: Internal Medicine

## 2021-01-12 ENCOUNTER — Inpatient Hospital Stay: Payer: Medicare PPO | Attending: Internal Medicine

## 2021-01-12 ENCOUNTER — Inpatient Hospital Stay: Payer: Medicare PPO

## 2021-01-12 DIAGNOSIS — Z794 Long term (current) use of insulin: Secondary | ICD-10-CM | POA: Insufficient documentation

## 2021-01-12 DIAGNOSIS — Z79899 Other long term (current) drug therapy: Secondary | ICD-10-CM | POA: Insufficient documentation

## 2021-01-12 DIAGNOSIS — K922 Gastrointestinal hemorrhage, unspecified: Secondary | ICD-10-CM | POA: Diagnosis present

## 2021-01-12 DIAGNOSIS — I480 Paroxysmal atrial fibrillation: Secondary | ICD-10-CM | POA: Diagnosis not present

## 2021-01-12 DIAGNOSIS — K7581 Nonalcoholic steatohepatitis (NASH): Secondary | ICD-10-CM | POA: Insufficient documentation

## 2021-01-12 DIAGNOSIS — K7460 Unspecified cirrhosis of liver: Secondary | ICD-10-CM | POA: Diagnosis not present

## 2021-01-12 DIAGNOSIS — E039 Hypothyroidism, unspecified: Secondary | ICD-10-CM | POA: Diagnosis not present

## 2021-01-12 DIAGNOSIS — E119 Type 2 diabetes mellitus without complications: Secondary | ICD-10-CM | POA: Insufficient documentation

## 2021-01-12 DIAGNOSIS — E611 Iron deficiency: Secondary | ICD-10-CM

## 2021-01-12 DIAGNOSIS — Z86000 Personal history of in-situ neoplasm of breast: Secondary | ICD-10-CM | POA: Insufficient documentation

## 2021-01-12 DIAGNOSIS — K766 Portal hypertension: Secondary | ICD-10-CM | POA: Diagnosis not present

## 2021-01-12 DIAGNOSIS — D61818 Other pancytopenia: Secondary | ICD-10-CM | POA: Diagnosis not present

## 2021-01-12 DIAGNOSIS — R001 Bradycardia, unspecified: Secondary | ICD-10-CM | POA: Insufficient documentation

## 2021-01-12 DIAGNOSIS — D6959 Other secondary thrombocytopenia: Secondary | ICD-10-CM | POA: Diagnosis not present

## 2021-01-12 DIAGNOSIS — I110 Hypertensive heart disease with heart failure: Secondary | ICD-10-CM | POA: Diagnosis not present

## 2021-01-12 DIAGNOSIS — D500 Iron deficiency anemia secondary to blood loss (chronic): Secondary | ICD-10-CM | POA: Diagnosis present

## 2021-01-12 DIAGNOSIS — I5032 Chronic diastolic (congestive) heart failure: Secondary | ICD-10-CM | POA: Diagnosis not present

## 2021-01-12 DIAGNOSIS — K219 Gastro-esophageal reflux disease without esophagitis: Secondary | ICD-10-CM | POA: Diagnosis not present

## 2021-01-12 DIAGNOSIS — D0512 Intraductal carcinoma in situ of left breast: Secondary | ICD-10-CM

## 2021-01-12 LAB — CBC WITH DIFFERENTIAL/PLATELET
Abs Immature Granulocytes: 0.01 10*3/uL (ref 0.00–0.07)
Basophils Absolute: 0 10*3/uL (ref 0.0–0.1)
Basophils Relative: 0 %
Eosinophils Absolute: 0.1 10*3/uL (ref 0.0–0.5)
Eosinophils Relative: 2 %
HCT: 34.7 % — ABNORMAL LOW (ref 36.0–46.0)
Hemoglobin: 11.7 g/dL — ABNORMAL LOW (ref 12.0–15.0)
Immature Granulocytes: 0 %
Lymphocytes Relative: 38 %
Lymphs Abs: 1.1 10*3/uL (ref 0.7–4.0)
MCH: 30.5 pg (ref 26.0–34.0)
MCHC: 33.7 g/dL (ref 30.0–36.0)
MCV: 90.6 fL (ref 80.0–100.0)
Monocytes Absolute: 0.3 10*3/uL (ref 0.1–1.0)
Monocytes Relative: 10 %
Neutro Abs: 1.4 10*3/uL — ABNORMAL LOW (ref 1.7–7.7)
Neutrophils Relative %: 50 %
Platelets: 81 10*3/uL — ABNORMAL LOW (ref 150–400)
RBC: 3.83 MIL/uL — ABNORMAL LOW (ref 3.87–5.11)
RDW: 14 % (ref 11.5–15.5)
WBC: 2.8 10*3/uL — ABNORMAL LOW (ref 4.0–10.5)
nRBC: 0 % (ref 0.0–0.2)

## 2021-01-12 LAB — COMPREHENSIVE METABOLIC PANEL
ALT: 15 U/L (ref 0–44)
AST: 24 U/L (ref 15–41)
Albumin: 3.8 g/dL (ref 3.5–5.0)
Alkaline Phosphatase: 49 U/L (ref 38–126)
Anion gap: 7 (ref 5–15)
BUN: 20 mg/dL (ref 8–23)
CO2: 28 mmol/L (ref 22–32)
Calcium: 9.3 mg/dL (ref 8.9–10.3)
Chloride: 99 mmol/L (ref 98–111)
Creatinine, Ser: 0.8 mg/dL (ref 0.44–1.00)
GFR, Estimated: >60 mL/min (ref >60)
Glucose, Bld: 297 mg/dL — ABNORMAL HIGH (ref 70–99)
Potassium: 4.3 mmol/L (ref 3.5–5.1)
Sodium: 134 mmol/L — ABNORMAL LOW (ref 135–145)
Total Bilirubin: 0.9 mg/dL (ref 0.3–1.2)
Total Protein: 7.6 g/dL (ref 6.5–8.1)

## 2021-01-12 LAB — IRON AND TIBC
Iron: 99 ug/dL (ref 28–170)
Saturation Ratios: 23 % (ref 10.4–31.8)
TIBC: 437 ug/dL (ref 250–450)
UIBC: 338 ug/dL

## 2021-01-12 LAB — FERRITIN: Ferritin: 41 ng/mL (ref 11–307)

## 2021-01-12 MED ORDER — IRON SUCROSE 20 MG/ML IV SOLN
200.0000 mg | Freq: Once | INTRAVENOUS | Status: AC
  Administered 2021-01-12: 200 mg via INTRAVENOUS
  Filled 2021-01-12: qty 10

## 2021-01-12 MED ORDER — SODIUM CHLORIDE 0.9 % IV SOLN
Freq: Once | INTRAVENOUS | Status: AC
  Filled 2021-01-12: qty 250

## 2021-01-12 NOTE — Progress Notes (Signed)
[facility] OFFICE PROGRESS NOTE  Patient Care Team: Albina Billet, MD as PCP - General (Internal Medicine) End, Harrell Gave, MD as PCP - Cardiology (Cardiology) Vickie Epley, MD as PCP - Electrophysiology (Cardiology)   Cancer Staging  No matching staging information was found for the patient.   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 OCT 2018- BMBx- MILD dyspoiesis; variable cellularity [10-50%]; FISH/Cytogenetics-NORMAL; F-One-NGS-declined by insurance.    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 Brandon/uro-gyne,[facility]]; Dr.Wohl/  # HRT [clinical trial thru [facility]; stopped July 2017 ]; CPAP   DIAGNOSIS: Left breast DCIS  STAGE:    0     ;GOALS: cure  CURRENT/MOST RECENT THERAPY: surveillaince    Ductal carcinoma in situ (DCIS) of left breast    INTERVAL HISTORY:  Annette Foster 80 y.o.  female pleasant patient above history of DCIS; cirrhosis portal hypertension; A. fib and pancytopenia is here for follow-up.  Denies any swelling in the legs.  Denies any shortness of breath or cough.  Complains of tiredness.  Denies any ongoing bleeding.   Review of Systems  Constitutional:  Positive for malaise/fatigue. Negative for chills, diaphoresis, fever and weight loss.  HENT:  Negative for nosebleeds and sore throat.   Eyes:  Negative for double vision.  Respiratory:  Negative for cough, hemoptysis, sputum production, shortness of breath and wheezing.   Cardiovascular:  Negative for chest pain, palpitations, orthopnea and leg swelling.  Gastrointestinal:  Negative for abdominal pain, blood in stool,  constipation, diarrhea, heartburn, melena, nausea and vomiting.  Musculoskeletal:  Positive for back pain and joint pain.  Skin: Negative.  Negative for itching and rash.  Neurological:  Negative for dizziness, tingling, focal weakness, weakness and headaches.  Endo/Heme/Allergies:  Does not bruise/bleed easily.  Psychiatric/Behavioral:  Negative for depression. The patient is not nervous/anxious and does not have insomnia.      PAST MEDICAL HISTORY :  Past Medical History:  Diagnosis Date   Abdominal pain    Allergy    Cancer ([place]) 2017   breast cancer- Left   Cataract    CHF (congestive heart failure) (HCC)    Collagenous colitis    Diabetes mellitus without complication (HCC)    Diarrhea    Diverticulosis    Fatty liver disease, nonalcoholic    Fibrocystic breast    GERD (gastroesophageal reflux disease)    Heart murmur    Hyperlipidemia    Hypertension    Hypothyroidism    IBS (irritable bowel syndrome)    IDA (iron deficiency anemia)    Personal history of radiation therapy 2017   LEFT BREAST CA   PONV (postoperative nausea and vomiting)    Sleep apnea    C-Pap    PAST SURGICAL HISTORY :   Past Surgical History:  Procedure Laterality Date   ABDOMINAL HYSTERECTOMY     [place] bso   ABDOMINAL SURGERY     APPENDECTOMY     BREAST BIOPSY Bilateral 2016   negative   BREAST 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/2015   DCIS, papillary carcinoma in situ   BREAST BIOPSY Left 05/27/2016   BENIGN MAMMARY EPITHELIUM   BREAST BIOPSY Left 11/20/2017   affirm bx x clip  BENIGN MAMMARY EPITHELIUM CONSISTENT WITH RAD THERAPY   BREAST LUMPECTOMY Left 10/17/2015   DCIS and papillary carcinoma insitu, clear margins   CARDIAC SURGERY     has replacement valve   CATARACT EXTRACTION Right    CHOLECYSTECTOMY     COLONOSCOPY WITH PROPOFOL N/A 03/11/2016   Procedure: COLONOSCOPY WITH PROPOFOL;  Surgeon: Manya Silvas, MD;  [facility] END[facility]COPY;  Service: Endoscopy;  Laterality: N/A;   COLONOSCOPY  WITH PROPOFOL N/A 02/18/2020   Procedure: COLONOSCOPY WITH PROPOFOL;  Surgeon: [name], MD;  Location: [facility] ENDOSCOPY;  Service: Endoscopy;  Laterality: N/A;   ESOPHAGOGASTRODUODENOSCOPY (EGD) WITH PROPOFOL N/A 03/11/2016   Procedure: ESOPHAGOGASTRODUODENOSCOPY (EGD) WITH PROPOFOL;  Surgeon: Manya Silvas, MD;  Location[facility] ENDOSCOPY;  Service: Endoscopy;  Laterality: N/A;   ESOPHAGOGASTRODUODENOSCOPY (EGD) WITH PROPOFOL N/A 02/18/2020   Procedure: ESOPHAGOGASTRODUODENOSCOPY (EGD) WITH PROPOFOL;  Surgeon: [name], MD;  Location: [facility] ENDOSCOPY;  Service: Endoscopy;  Laterality: N/A;   ESOPHAGOGASTRODUODENOSCOPY (EGD) WITH PROPOFOL N/A 04/25/2020   Procedure: ESOPHAGOGASTRODUODENOSCOPY (EGD) WITH PROPOFOL;  Surgeon: [name], MD;  Location: [facility] ENDOSCOPY;  Service: Endoscopy;  Laterality: N/A;   ESOPHAGOGASTRODUODENOSCOPY (EGD) WITH PROPOFOL N/A 05/23/2020   Procedure: ESOPHAGOGASTRODUODENOSCOPY (EGD) WITH PROPOFOL;  Surgeon: [name], MD;  Location: [facility] ENDOSCOPY;  Service: Endoscopy;  Laterality: N/A;   FINGER ARTHROSCOPY WITH CARPOMETACARPEL ([place]) ARTHROPLASTY Right 09/03/2018   Procedure: CARPOMETACARPEL [facility]) ARTHROPLASTY RIGHT THUMB;  Surgeon: Hessie Knows, MD;  Location: [facility] ORS;  Service: Orthopedics;  Laterality: Right;   GANGLION CYST EXCISION Right 09/03/2018   Procedure: REMOVAL GANGLION OF WRIST;  Surgeon: Hessie Knows, MD;  Location: [facility] ORS;  Service: Orthopedics;  Laterality: Right;   HARDWARE REMOVAL Right 09/03/2018   Procedure: HARDWARE REMOVAL RIGHT THUMB;  Surgeon: Hessie Knows, MD;  Location: [facility] ORS;  Service: Orthopedics;  Laterality: Right;  staple removed   JOINT REPLACEMENT Left    TKR   left sinusplasty      MASTECTOMY, PARTIAL Left 10/17/2015   Procedure: MASTECTOMY PARTIAL REVISION;  Surgeon: Leonie Green, MD;  Location: [facility] ORS;  Service: General;  Laterality: Left;   PARTIAL MASTECTOMY WITH NEEDLE LOCALIZATION Left 09/29/2015   Procedure:  PARTIAL MASTECTOMY WITH NEEDLE LOCALIZATION;  Surgeon: Leonie Green, MD;  Location: [facility] ORS;  Service: General;  Laterality: Left;   TOTAL ABDOMINAL HYSTERECTOMY W/ BILATERAL SALPINGOOPHORECTOMY      FAMILY HISTORY :   Family History  Problem Relation Age of Onset   Breast cancer Paternal Grandmother    Colon cancer Father    Diabetes Sister    Diabetes Brother    Heart disease Brother    Prostate cancer Brother    Colon cancer Maternal Uncle    Prostate cancer Brother    Bladder Cancer Brother    Leukemia Mother        all   Ovarian cancer Neg Hx    Kidney cancer Neg Hx     SOCIAL HISTORY:   Social History   Tobacco Use   Smoking status: Never   Smokeless tobacco: Never  Vaping Use   Vaping Use: Never used  Substance Use Topics   Alcohol use: No    Alcohol/week: 0.0 standard drinks   Drug use: No    ALLERGIES:  is allergic to codeine, demeclocycline, demerol [meperidine], hydrocodone, oxycodone, pentazocine, tetracyclines & related, and fentanyl.  MEDICATIONS:  Current Outpatient Medications  Medication Sig Dispense Refill   azelastine (OPTIVAR) 0.05 % ophthalmic solution daily as needed.     benzonatate (  TESSALON) 200 MG capsule Take 200 mg by mouth 3 (three) times daily as needed for cough.     Cholecalciferol (VITAMIN D) 50 MCG (2000 UT) CAPS Take 2,000 Units by mouth daily.     clobetasol ointment (TEMOVATE) 7.93 % 1 application two days per week PRN     clotrimazole (LOTRIMIN) 1 % cream Apply 1 application topically 2 (two) times daily. Apply to irritated vulvar area twice daily. 60 g 6   cyanocobalamin (,VITAMIN B-12,) 1000 MCG/ML injection 1,000 mcg every 30 (thirty) days.     dicyclomine (BENTYL) 10 MG capsule Take 10 mg by mouth at bedtime.      docusate sodium (COLACE) 100 MG capsule Take 1 capsule by mouth 2 (two) times daily as needed.     EPINEPHrine 0.3 mg/0.3 mL IJ SOAJ injection Inject 0.3 mg into the muscle as needed.     escitalopram  (LEXAPRO) 5 MG tablet Take 5 mg by mouth at bedtime.     esomeprazole (NEXIUM) 40 MG capsule Take 40 mg by mouth 2 (two) times daily before a meal.      estradiol (ESTRACE) 0.1 MG/GM vaginal cream Place 1 Applicatorful vaginally as needed.     fenofibrate 160 MG tablet Take 160 mg by mouth at bedtime.     gabapentin (NEURONTIN) 300 MG capsule Take 300 mg by mouth at bedtime.      hydrOXYzine (ATARAX/VISTARIL) 25 MG tablet 1 tablet as needed     Insulin Degludec (TRESIBA) 100 UNIT/ML SOLN Inject 110 Units into the skin at bedtime.     levothyroxine (SYNTHROID, LEVOTHROID) 75 MCG tablet Take 75 mcg by mouth daily before breakfast.      magnesium oxide (MAG-OX) 400 MG tablet Take 400 mg by mouth 2 (two) times daily.      nadolol (CORGARD) 20 MG tablet Take 0.5 tablets (10 mg total) by mouth daily. 30 tablet 2   NOVOLOG FLEXPEN 100 UNIT/ML FlexPen Inject 15 Units into the skin 3 (three) times daily with meals. Based on sliding scale     potassium chloride SA (K-DUR,KLOR-CON) 20 MEQ tablet Take 20 mEq by mouth 2 (two) times daily.      saccharomyces boulardii (FLORASTOR) 250 MG capsule Take 250 mg by mouth 2 (two) times daily.      simvastatin (ZOCOR) 20 MG tablet Take 20 mg by mouth daily.     spironolactone (ALDACTONE) 25 MG tablet TAKE 1 TABLET (25 MG TOTAL) BY MOUTH DAILY. 90 tablet 2   Tirzepatide (MOUNJARO [place]) Inject into the skin.     torsemide (DEMADEX) 20 MG tablet TAKE 1 TABLET BY MOUTH EVERY DAY 90 tablet 0   valACYclovir (VALTREX) 500 MG tablet Take 500 mg by mouth See admin instructions. For fever blisters take 592m twice a day as needed  1   No current facility-administered medications for this visit.    PHYSICAL EXAMINATION: ECOG PERFORMANCE STATUS: 1 - Symptomatic but completely ambulatory  BP (!) 121/55   Pulse (!) 58   Temp 98.8 F (37.1 C)   Resp 16   Wt 167 lb (75.8 kg)   LMP  (LMP Unknown)   BMI 29.58 kg/m   Filed Weights   01/12/21 1315  Weight: 167 lb (75.8 kg)     Physical Exam Constitutional:      Comments: Alone.  Ambulating independently.  HENT:     Head: Normocephalic and atraumatic.     Mouth/Throat:     Pharynx: No oropharyngeal exudate.  Eyes:  Pupils: Pupils are equal, round, and reactive to light.  Cardiovascular:     Rate and Rhythm: Tachycardia present. Rhythm irregular.  Pulmonary:     Effort: Pulmonary effort is normal. No respiratory distress.     Breath sounds: Normal breath sounds. No wheezing.  Abdominal:     General: Bowel sounds are normal. There is no distension.     Palpations: Abdomen is soft. There is no mass.     Tenderness: There is no abdominal tenderness. There is no guarding or rebound.     Comments: Positive for splenomegaly.  Musculoskeletal:        General: No tenderness. Normal range of motion.     Cervical back: Normal range of motion and neck supple.  Skin:    General: Skin is warm.  Neurological:     Mental Status: She is alert and oriented to person, place, and time.  Psychiatric:        Mood and Affect: Affect normal.     LABORATORY DATA:  I have reviewed the data as listed    Component Value Date/Time   NA 134 (L) 01/12/2021 1255   NA 137 11/30/2020 0944   K 4.3 01/12/2021 1255   CL 99 01/12/2021 1255   CO2 28 01/12/2021 1255   GLUCOSE 297 (H) 01/12/2021 1255   BUN 20 01/12/2021 1255   BUN 10 11/30/2020 0944   CREATININE 0.80 01/12/2021 1255   CALCIUM 9.3 01/12/2021 1255   PROT 7.6 01/12/2021 1255   PROT 7.1 11/30/2020 0944   ALBUMIN 3.8 01/12/2021 1255   ALBUMIN 3.8 11/30/2020 0944   AST 24 01/12/2021 1255   ALT 15 01/12/2021 1255   ALKPHOS 49 01/12/2021 1255   BILITOT 0.9 01/12/2021 1255   BILITOT 0.8 11/30/2020 0944   GFRNONAA >60 01/12/2021 1255   GFRAA 80 02/25/2020 1105    No results found for: SPEP, UPEP  Lab Results  Component Value Date   WBC 2.8 (L) 01/12/2021   NEUTROABS 1.4 (L) 01/12/2021   HGB 11.7 (L) 01/12/2021   HCT 34.7 (L) 01/12/2021   MCV 90.6  01/12/2021   PLT 81 (L) 01/12/2021      Chemistry      Component Value Date/Time   NA 134 (L) 01/12/2021 1255   NA 137 11/30/2020 0944   K 4.3 01/12/2021 1255   CL 99 01/12/2021 1255   CO2 28 01/12/2021 1255   BUN 20 01/12/2021 1255   BUN 10 11/30/2020 0944   CREATININE 0.80 01/12/2021 1255      Component Value Date/Time   CALCIUM 9.3 01/12/2021 1255   ALKPHOS 49 01/12/2021 1255   AST 24 01/12/2021 1255   ALT 15 01/12/2021 1255   BILITOT 0.9 01/12/2021 1255   BILITOT 0.8 11/30/2020 0944       RADIOGRAPHIC STUDIES: I have personally reviewed the radiological images as listed and agreed with the findings in the report. No results found.   ASSESSMENT & PLAN:  Other pancytopenia ([place]) # Anemia-secondary GI bleed chronic.Marland Kitchen  Hemoglobin-12.0.  Hold off IV iron infusion.  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  ANC-900; Platelets 70s. STABLE.   # Tachy-brady syndrome- Hx of A.fib [awaiting evaluation with Dr.End]-defer to cardiology regarding management.  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 Mammogram December 2021-within normal limits.  Stable.  # DISPOSITION:  # Venofer today # follow up in 4 month- MD; labs-  cbc/cmp; AFP;  possible Venofer-Dr.B   Orders Placed This Encounter  Procedures   CBC with Differential/Platelet    Standing Status:   Future    Standing Expiration Date:   01/12/2022   Comprehensive metabolic panel    Standing Status:   Future    Standing Expiration Date:   01/12/2022   AFP tumor marker    Standing Status:   Future    Standing Expiration Date:   01/12/2022   All questions were answered. The patient knows to call the clinic with any problems, questions or concerns.      Cammie Sickle, MD 01/12/2021 2:44 PM

## 2021-01-12 NOTE — Patient Instructions (Signed)
[facility] CANCER CTR AT [place]  Discharge Instructions: Thank you for choosing [facility] to provide your oncology and hematology care.  If you have a lab appointment with the [facility], please go directly to the [facility] and check in at the registration area.  Wear comfortable clothing and clothing appropriate for easy access to any Portacath or PICC line.   We strive to give you quality time with your provider. You may need to reschedule your appointment if you arrive late (15 or more minutes).  Arriving late affects you and other patients whose appointments are after yours.  Also, if you miss three or more appointments without notifying the office, you may be dismissed from the clinic at the provider's discretion.      For prescription refill requests, have your pharmacy contact our office and allow 72 hours for refills to be completed.    Today you received the following chemotherapy and/or immunotherapy agents VENOFER      To help prevent nausea and vomiting after your treatment, we encourage you to take your nausea medication as directed.  BELOW ARE SYMPTOMS THAT SHOULD BE REPORTED IMMEDIATELY: *FEVER GREATER THAN 100.4 F (38 C) OR HIGHER *CHILLS OR SWEATING *NAUSEA AND VOMITING THAT IS NOT CONTROLLED WITH YOUR NAUSEA MEDICATION *UNUSUAL SHORTNESS OF BREATH *UNUSUAL BRUISING OR BLEEDING *URINARY PROBLEMS (pain or burning when urinating, or frequent urination) *BOWEL PROBLEMS (unusual diarrhea, constipation, pain near the anus) TENDERNESS IN MOUTH AND THROAT WITH OR WITHOUT PRESENCE OF ULCERS (sore throat, sores in mouth, or a toothache) UNUSUAL RASH, SWELLING OR PAIN  UNUSUAL VAGINAL DISCHARGE OR ITCHING   Items with * indicate a potential emergency and should be followed up as soon as possible or go to the Emergency Department if any problems should occur.  Please show the CHEMOTHERAPY ALERT CARD or IMMUNOTHERAPY ALERT CARD at check-in to the  Emergency Department and triage nurse.  Should you have questions after your visit or need to cancel or reschedule your appointment, please contact [facility] CANCER [place] AT [place]  [phone] and follow the prompts.  Office hours are 8:00 a.m. to 4:30 p.m. Monday - Friday. Please note that voicemails left after 4:00 p.m. may not be returned until the following business day.  We are closed weekends and major holidays. You have access to a nurse at all times for urgent questions. Please call the main number to the clinic [phone] and follow the prompts.  For any non-urgent questions, you may also contact your provider using MyChart. We now offer e-Visits for anyone 58 and older to request care online for non-urgent symptoms. For details visit [email or web address].   Also download the MyChart app! Go to the app store, search "MyChart", open the app, select [facility], and log in with your MyChart username and password.  Due to Covid, a mask is required upon entering the hospital/clinic. If you do not have a mask, one will be given to you upon arrival. For doctor visits, patients may have 1 support person aged 29 or older with them. For treatment visits, patients cannot have anyone with them due to current Covid guidelines and our immunocompromised population.   Iron Sucrose Injection What is this medication? IRON SUCROSE (EYE ern SOO krose) treats low levels of iron (iron deficiency anemia) in people with kidney disease. Iron is a mineral that plays an important role in making red blood cells, which carry oxygen from your lungs to the rest of your body. This medicine may  be used for other purposes; ask your health care provider or pharmacist if you have questions. COMMON BRAND NAME(S): Venofer What should I tell my care team before I take this medication? They need to know if you have any of these conditions: Anemia not caused by low iron levels Heart disease High levels of  iron in the blood Kidney disease Liver disease An unusual or allergic reaction to iron, other medications, foods, dyes, or preservatives Pregnant or trying to get pregnant Breast-feeding How should I use this medication? This medication is for infusion into a vein. It is given in a hospital or clinic setting. Talk to your care team about the use of this medication in children. While this medication may be prescribed for children as young as 2 years for selected conditions, precautions do apply. Overdosage: If you think you have taken too much of this medicine contact a poison control center or emergency room at once. NOTE: This medicine is only for you. Do not share this medicine with others. What if I miss a dose? It is important not to miss your dose. Call your care team if you are unable to keep an appointment. What may interact with this medication? Do not take this medication with any of the following: Deferoxamine Dimercaprol Other iron products This medication may also interact with the following: Chloramphenicol Deferasirox This list may not describe all possible interactions. Give your health care provider a list of all the medicines, herbs, non-prescription drugs, or dietary supplements you use. Also tell them if you smoke, drink alcohol, or use illegal drugs. Some items may interact with your medicine. What should I watch for while using this medication? Visit your care team regularly. Tell your care team if your symptoms do not start to get better or if they get worse. You may need blood work done while you are taking this medication. You may need to follow a special diet. Talk to your care team. Foods that contain iron include: whole grains/cereals, dried fruits, beans, or peas, leafy green vegetables, and organ meats (liver, kidney). What side effects may I notice from receiving this medication? Side effects that you should report to your care team as soon as  possible: Allergic reactions--skin rash, itching, hives, swelling of the face, lips, tongue, or throat Low blood pressure--dizziness, feeling faint or lightheaded, blurry vision Shortness of breath Side effects that usually do not require medical attention (report to your care team if they continue or are bothersome): Flushing Headache Joint pain Muscle pain Nausea Pain, redness, or irritation at injection site This list may not describe all possible side effects. Call your doctor for medical advice about side effects. You may report side effects to FDA at 1-800-FDA-1088. Where should I keep my medication? This medication is given in a hospital or clinic and will not be stored at home. NOTE: This sheet is a summary. It may not cover all possible information. If you have questions about this medicine, talk to your doctor, pharmacist, or health care provider.  2022 Elsevier/Gold Standard (2020-06-23 00:00:00)

## 2021-01-12 NOTE — Assessment & Plan Note (Signed)
#   Anemia-secondary GI bleed chronic.Marland Kitchen  Hemoglobin-12.0.  Hold off IV iron infusion.  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  ANC-900; Platelets 70s. STABLE.   # Tachy-brady syndrome- Hx of A.fib [awaiting evaluation with Dr.End]-defer to cardiology regarding management.  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 Mammogram December 2021-within normal limits.  Stable.  # DISPOSITION:  # Venofer today # follow up in 4 month- MD; labs- cbc/cmp; AFP;  possible Venofer-Dr.B

## 2021-01-12 NOTE — Progress Notes (Signed)
Patient denies new problems/concerns today.   °

## 2021-01-15 ENCOUNTER — Encounter: Payer: Self-pay | Admitting: Medical

## 2021-01-15 ENCOUNTER — Other Ambulatory Visit: Payer: Self-pay

## 2021-01-15 ENCOUNTER — Ambulatory Visit (INDEPENDENT_AMBULATORY_CARE_PROVIDER_SITE_OTHER): Payer: Medicare PPO | Admitting: Medical

## 2021-01-15 VITALS — BP 110/56 | HR 51 | Ht 63.0 in | Wt 167.0 lb

## 2021-01-15 DIAGNOSIS — I8500 Esophageal varices without bleeding: Secondary | ICD-10-CM | POA: Diagnosis not present

## 2021-01-15 DIAGNOSIS — K7460 Unspecified cirrhosis of liver: Secondary | ICD-10-CM

## 2021-01-15 DIAGNOSIS — E1169 Type 2 diabetes mellitus with other specified complication: Secondary | ICD-10-CM

## 2021-01-15 DIAGNOSIS — I5032 Chronic diastolic (congestive) heart failure: Secondary | ICD-10-CM | POA: Diagnosis not present

## 2021-01-15 DIAGNOSIS — E785 Hyperlipidemia, unspecified: Secondary | ICD-10-CM | POA: Diagnosis not present

## 2021-01-15 DIAGNOSIS — R188 Other ascites: Secondary | ICD-10-CM

## 2021-01-15 DIAGNOSIS — I480 Paroxysmal atrial fibrillation: Secondary | ICD-10-CM

## 2021-01-15 NOTE — Patient Instructions (Signed)
Medication Instructions:  - Your physician recommends that you continue on your current medications as directed. Please refer to the Current Medication list given to you today.  *If you need a refill on your cardiac medications before your next appointment, please call your pharmacy*   Lab Work: - none ordered  If you have labs (blood work) drawn today and your tests are completely normal, you will receive your results only by: [place] (if you have MyChart) OR A paper copy in the mail If you have any lab test that is abnormal or we need to change your treatment, we will call you to review the results.   Testing/Procedures: - none ordered   Follow-Up: At [facility], you and your health needs are our priority.  As part of our continuing mission to provide you with exceptional heart care, we have created designated Provider Care Teams.  These Care Teams include your primary Cardiologist (physician) and Advanced Practice Providers (APPs -  Physician Assistants and Nurse Practitioners) who all work together to provide you with the care you need, when you need it.  We recommend signing up for the patient portal called "MyChart".  Sign up information is provided on this After Visit Summary.  MyChart is used to connect with patients for Virtual Visits (Telemedicine).  Patients are able to view lab/test results, encounter notes, upcoming appointments, etc.  Non-urgent messages can be sent to your provider as well.   To learn more about what you can do with MyChart, go to [email or web address].    Your next appointment:   3 month(s)  The format for your next appointment:   In Person  Provider:   You may see Nelva Bush, MD or one of the following Advanced Practice Providers on your designated Care Team:    Cadence Kathlen Mody, [place]    Other Instructions N/a

## 2021-01-15 NOTE — Progress Notes (Signed)
Cardiology Office Note:    Date:  01/15/2021   ID:  Annette Foster, DOB 27-Aug-1940, MRN [id number]  PCP:  Albina Billet, MD  [facility] Cardiologist:  Nelva Bush, MD  [facility] Electrophysiologist:  Vickie Epley, MD   Referring MD: Albina Billet, MD   Chief Complaint: 6 week follow-up  History of Present Illness:    Annette Foster is a 80 y.o. female with a hx of remote valve surgery in the 1970s, HFpEF, paroxysmal afib complicated by GIB on a/c, HTN, HLD, NASH, seizure disorder, OSA, left breast cancer s/p mastectomy who presents for follow-up of afib and valvular heart disease.   Seen by EP for watchman, however left atrial appendage morphology precludes watchman placement. Anticoagulation was not pursued given h/o GIB with esophageal varices.   Last seen 11/30/20 and was feeling well with intermittent palpitations. Nebivolol was transitioned to nadolol 37m daily for esophageal varices and cirrhosis.  Today, the patient reports she has been doing well since the last visit. She has no updates from GI, she will see GI at the end of the month. Still has palpitations, occur intermittently. HR can go from 46bpm to 120bpm in the AM. Palpitations varies, can occur once a week, maybe once every two weeks. Palpitations are brief. Feels they are worse with stress. Husband has dementia, has been sick. No chest pain, SOB, LLE, orthopnea, pnd.    Past Medical History:  Diagnosis Date   Abdominal pain    Allergy    [place]   breast cancer- Left   Cataract    CHF (congestive heart failure) (HCC)    Collagenous colitis    Di[place]    Diarrhea    Diverticulosis    Fatty liver disease, nonalcoholic    Fibrocystic breast    GERD (gastroesophageal reflux disease)    Heart murmur    Hyperlipidemia    Hypertension    Hypothyroidism    IBS (irritable bowel syndrome)    IDA (iron deficiency anemia)    Personal history  of radiation therapy 2017   LEFT BREAST CA   PONV (postoperative nausea and vomiting)    Sleep apnea    C-Pap    Past Surgical History:  Procedure Laterality Date   ABDOMINAL HYSTERECTOMY     tah bso   ABDOMINAL SURGERY     APPENDECTOMY     BREAST BIOPSY Bilateral 2016   negative   BREAST 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/2015   DCIS, papillary carcinoma in situ   BREAST BIOPSY Left 05/27/2016   BENIGN MAMMARY EPITHELIUM   BREAST BIOPSY Left 11/20/2017   affirm bx x clip BENIGN MAMMARY EPITHELIUM CONSISTENT WITH RAD THERAPY   BREAST LUMPECTOMY Left 10/17/2015   DCIS and papillary carcinoma insitu, clear margins   CARDIAC SURGERY     has replacement valve   CATARACT EXTRACTION Right    CHOLECYSTECTOMY     COLONOSCOPY WITH PROPOFOL N/A 03/11/2016   Procedure: COLONOSCOPY WITH PROPOFOL;  Surgeon: RManya Silvas MD;  [facility]  Service: Endoscopy;  Laterality: N/A;   COLONOSCOPY WITH PROPOFOL N/A 02/18/2020   Procedure: COLONOSCOPY WITH PROPOFOL;  Surgeon: WLucilla Lame MD;  [facility]  Service: Endoscopy;  Laterality: N/A;   ESOPHAGOGASTRODUODENOSCOPY (EGD) WITH PROPOFOL N/A 03/11/2016   Procedure: ESOPHAGOGASTRODUODENOSCOPY (EGD) WITH PROPOFOL;  Surgeon: RManya Silvas MD;  [facility];  Service: Endoscopy;  Laterality: N/A;   ESOPHAGOGASTRODUODENOSCOPY (EGD) WITH PROPOFOL N/A 02/18/2020  Procedure: ESOPHAGOGASTRODUODENOSCOPY (EGD) WITH PROPOFOL;  Surgeon: [name], MD;  Location[facility] END[facility]COPY;  Service: Endoscopy;  Laterality: N/A;   ESOPHAGOGASTRODUODENOSCOPY (EGD) WITH PROPOFOL N/A 04/25/2020   Procedure: ESOPHAGOGASTRODUODENOSCOPY (EGD) WITH PROPOFOL;  Surgeon: [name], MD;  Location[facility] ENDOSCOPY;  Service: Endoscopy;  Laterality: N/A;   ESOPHAGOGASTRODUODENOSCOPY (EGD) WITH PROPOFOL N/A 05/23/2020   Procedure: ESOPHAGOGASTRODUODENOSCOPY (EGD) WITH PROPOFOL;  Surgeon: [name], MD;  Location: [facility] ENDOSCOPY;  Service: Endoscopy;   Laterality: N/A;   FINGER ARTHROSCOPY WITH CARPOMETACARPEL ([place]) ARTHROPLASTY Right 09/03/2018   Procedure: CARPOMETACARPEL [facility]) ARTHROPLASTY RIGHT THUMB;  Surgeon: Hessie Knows, MD;  Location: [facility] ORS;  Service: Orthopedics;  Laterality: Right;   GANGLION CYST EXCISION Right 09/03/2018   Procedure: REMOVAL GANGLION OF WRIST;  Surgeon: Hessie Knows, MD;  Location: [facility] ORS;  Service: Orthopedics;  Laterality: Right;   HARDWARE REMOVAL Right 09/03/2018   Procedure: HARDWARE REMOVAL RIGHT THUMB;  Surgeon: Hessie Knows, MD;  Location: [facility] ORS;  Service: Orthopedics;  Laterality: Right;  staple removed   JOINT REPLACEMENT Left    TKR   left sinusplasty      MASTECTOMY, PARTIAL Left 10/17/2015   Procedure: MASTECTOMY PARTIAL REVISION;  Surgeon: Leonie Green, MD;  Location: [facility] ORS;  Service: General;  Laterality: Left;   PARTIAL MASTECTOMY WITH NEEDLE LOCALIZATION Left 09/29/2015   Procedure: PARTIAL MASTECTOMY WITH NEEDLE LOCALIZATION;  Surgeon: Leonie Green, MD;  Location: [facility] ORS;  Service: General;  Laterality: Left;   TOTAL ABDOMINAL HYSTERECTOMY W/ BILATERAL SALPINGOOPHORECTOMY      Current Medications: Current Meds  Medication Sig   azelastine (OPTIVAR) 0.05 % ophthalmic solution daily as needed.   benzonatate (TESSALON) 200 MG capsule Take 200 mg by mouth 3 (three) times daily as needed for cough.   Cholecalciferol (VITAMIN D) 50 MCG (2000 UT) CAPS Take 2,000 Units by mouth daily.   clobetasol ointment (TEMOVATE) 8.25 % 1 application two days per week PRN   clotrimazole (LOTRIMIN) 1 % cream Apply 1 application topically 2 (two) times daily. Apply to irritated vulvar area twice daily.   cyanocobalamin (,VITAMIN B-12,) 1000 MCG/ML injection 1,000 mcg every 30 (thirty) days.   dicyclomine (BENTYL) 10 MG capsule Take 10 mg by mouth at bedtime.    docusate sodium (COLACE) 100 MG capsule Take 1 capsule by mouth 2 (two) times daily as needed.   EPINEPHrine 0.3 mg/0.3 mL IJ  SOAJ injection Inject 0.3 mg into the muscle as needed.   escitalopram (LEXAPRO) 5 MG tablet Take 5 mg by mouth at bedtime.   esomeprazole (NEXIUM) 40 MG capsule Take 40 mg by mouth 2 (two) times daily before a meal.    estradiol (ESTRACE) 0.1 MG/GM vaginal cream Place 1 Applicatorful vaginally as needed.   fenofibrate 160 MG tablet Take 160 mg by mouth at bedtime.   gabapentin (NEURONTIN) 300 MG capsule Take 300 mg by mouth at bedtime.    hydrOXYzine (ATARAX/VISTARIL) 25 MG tablet 1 tablet as needed   Insulin Degludec (TRESIBA) 100 UNIT/ML SOLN Inject 110 Units into the skin at bedtime.   levothyroxine (SYNTHROID, LEVOTHROID) 75 MCG tablet Take 75 mcg by mouth daily before breakfast.    magnesium oxide (MAG-OX) 400 MG tablet Take 400 mg by mouth 2 (two) times daily.    nadolol (CORGARD) 20 MG tablet Take 0.5 tablets (10 mg total) by mouth daily.   NOVOLOG FLEXPEN 100 UNIT/ML FlexPen Inject into the skin 3 (three) times daily with meals. Based on sliding scale   potassium chloride SA (  K-DUR,KLOR-CON) 20 MEQ tablet Take 20 mEq by mouth 2 (two) times daily.    saccharomyces boulardii (FLORASTOR) 250 MG capsule Take 250 mg by mouth 2 (two) times daily.    simvastatin (ZOCOR) 20 MG tablet Take 20 mg by mouth daily.   spironolactone (ALDACTONE) 25 MG tablet TAKE 1 TABLET (25 MG TOTAL) BY MOUTH DAILY.   Tirzepatide [facility]) Inject into the skin once a week.   torsemide (DEMADEX) 20 MG tablet TAKE 1 TABLET BY MOUTH EVERY DAY   valACYclovir (VALTREX) 500 MG tablet Take 500 mg by mouth See admin instructions. For fever blisters take 525m twice a day as needed     Allergies:   Codeine, Demeclocycline, Demerol [meperidine], Hydrocodone, Oxycodone, Pentazocine, Tetracyclines & related, and Fentanyl   Social History   Socioeconomic History   Marital status: Married    Spouse name: Not on file   Number of children: Not on file   Years of education: Not on file   Highest education level: Not on  file  Occupational History   Not on file  Tobacco Use   Smoking status: Never   Smokeless tobacco: Never  Vaping Use   Vaping Use: Never used  Substance and Sexual Activity   Alcohol use: No    Alcohol/week: 0.0 standard drinks   Drug use: No   Sexual activity: Not Currently    Birth control/protection: Surgical  Other Topics Concern   Not on file  Social History Narrative   Not on file   Social Determinants of Health   Financial Resource Strain: Not on file  Food Insecurity: Not on file  Transportation Needs: Not on file  Physical Activity: Not on file  Stress: Not on file  Social Connections: Not on file     Family History: The patient's family history includes Bladder Cancer in her brother; Breast cancer in her paternal grandmother; Colon cancer in her father and maternal uncle; Diabetes in her brother and sister; Heart disease in her brother; Leukemia in her mother; Prostate cancer in her brother and brother. There is no history of Ovarian cancer or Kidney cancer.  ROS:   Please see the history of present illness.     All other systems reviewed and are negative.  EKGs/Labs/Other Studies Reviewed:    The following studies were reviewed today:  Heart monitor 03/2020 The patient was monitored for 14 days. The predominant rhythm was sinus with an average rate of 55 bpm (range 42-96 bpm in sinus). There were occasional PAC's and rare PVC's. 16 atrial runs lasting up to 12 beats occurred with a maximum rate of 164 bpm. There was a single episode of nonsustained ventricular tachycardia lasting 6 beats with a maximum rate of 152 bpm. No sustained arrhythmia or prolonged pause was observed. Patient triggered events correspond to sinus rhythm, PAC's, and PVC's.   Predominantly sinus rhythm with occasional PAC's and rare PVC's, as well as rare brief episodes of PSVT and NSVT.  Echo 01/2020   1. Left ventricular ejection fraction, by estimation, is 50 to 55%. The  left  ventricle has low normal function. Left ventricular endocardial  border not optimally defined to evaluate regional wall motion. There is  mild left ventricular hypertrophy. Left  ventricular diastolic parameters are indeterminate.   2. Right ventricular systolic function is mildly reduced. The right  ventricular size is normal. Mildly increased right ventricular wall  thickness. There is moderately elevated pulmonary artery systolic  pressure.   3. Left atrial size was  moderately dilated.   4. Right atrial size was mildly dilated.   5. The pericardial effusion is posterior to the left ventricle.   6. The mitral valve is abnormal. Trivial mitral valve regurgitation.   7. Tricuspid valve regurgitation is mild to moderate.   8. The aortic valve is tricuspid. There is mild calcification of the  aortic valve. There is mild thickening of the aortic valve. Aortic valve  regurgitation is not visualized. Mild to moderate aortic valve  sclerosis/calcification is present, without any  evidence of aortic stenosis.   9. There is mild dilatation of the ascending aorta, measuring 36 mm.  10. The inferior vena cava is dilated in size with <50% resrespiratory  variability, suggesting right atrial pressure of 15 mmHg.   EKG:  EKG is  ordered today.  The ekg ordered today demonstrates NSR, 51bpm, TWI inferolateral ischemia  Recent Labs: 02/07/2020: Magnesium 2.3; TSH 3.169 02/16/2020: B Natriuretic Peptide 377.8 01/12/2021: ALT 15; BUN 20; Creatinine, Ser 0.80; Hemoglobin 11.7; Platelets 81; Potassium 4.3; Sodium 134  Recent Lipid Panel    Component Value Date/Time   CHOL 160 11/30/2020 0944   TRIG 156 (H) 11/30/2020 0944   HDL 26 (L) 11/30/2020 0944   CHOLHDL 6.2 (H) 11/30/2020 0944   LDLCALC 106 (H) 11/30/2020 0944     Physical Exam:    VS:  BP (!) 100/56 (BP Location: Right Arm, Patient Position: Sitting, Cuff Size: Normal)   Pulse (!) 51   Ht 5' 3"  (1.6 m)   Wt 167 lb (75.8 kg)   LMP  (LMP  Unknown)   SpO2 98%   BMI 29.58 kg/m     Wt Readings from Last 3 Encounters:  01/15/21 167 lb (75.8 kg)  01/12/21 167 lb (75.8 kg)  11/30/20 171 lb (77.6 kg)     GEN:  Well nourished, well developed in no acute distress HEENT: Normal NECK: No JVD; No carotid bruits LYMPHATICS: No lymphadenopathy CARDIAC: RRR, no murmurs, rubs, gallops RESPIRATORY:  Clear to auscultation without rales, wheezing or rhonchi  ABDOMEN: Soft, non-tender, non-distended MUSCULOSKELETAL:  No edema; No deformity  SKIN: Warm and dry NEUROLOGIC:  Alert and oriented x 3 PSYCHIATRIC:  Normal affect   ASSESSMENT:    1. Paroxysmal atrial fibrillation (HCC)   2. Chronic heart failure with preserved ejection fraction (HFpEF) ([place])   3. Hyperlipidemia associated with type 2 diabetes mellitus ([place])   4. Esophageal varices without bleeding, unspecified esophageal varices type ([place])   5. Cirrhosis of liver with ascites, unspecified hepatic cirrhosis type ([place])    PLAN:    In order of problems listed above:  Paroxysmal Afib Patient has intermittent palpitations that are tolerable. Prior heart monitor 03/2020 showed no afib, occasional PACs/PVCs, 16 atrial runs, NSVT lasting 6 beats. She is not on a/c due to h/o esophageal varices. She saw EP and is not a candidate for watchman. Continue nadalol.   HFpEF She is euvolemic on exam. She takes Torsemide and spironolactone. Most recent labs with normal kidney function. Continue BB.   Cirrhosis and esophageal varices Most recent Hgb stable 11.7. She will see GI at the end of the month.   HLD LDL 106, continue simvastatin.  Disposition: Follow up in 3 month(s) with MDAPP   Signed, Aprille Sawhney Ninfa Meeker, PA-C  01/15/2021 9:09 PM    [facility]

## 2021-01-16 ENCOUNTER — Ambulatory Visit: Payer: Medicare PPO | Admitting: Anesthesiology

## 2021-01-16 ENCOUNTER — Ambulatory Visit
Admission: RE | Admit: 2021-01-16 | Discharge: 2021-01-16 | Disposition: A | Discharge status: Home / Self Care | Payer: Medicare PPO | Attending: Ophthalmology | Admitting: Ophthalmology

## 2021-01-16 ENCOUNTER — Other Ambulatory Visit: Payer: Self-pay

## 2021-01-16 ENCOUNTER — Encounter: Payer: Self-pay | Admitting: Ophthalmology

## 2021-01-16 ENCOUNTER — Encounter
Admission: RE | Disposition: A | Discharge status: Home / Self Care | Payer: Self-pay | Source: Home / Self Care | Attending: Ophthalmology

## 2021-01-16 DIAGNOSIS — E119 Type 2 diabetes mellitus without complications: Secondary | ICD-10-CM | POA: Insufficient documentation

## 2021-01-16 DIAGNOSIS — Z794 Long term (current) use of insulin: Secondary | ICD-10-CM | POA: Diagnosis not present

## 2021-01-16 DIAGNOSIS — H2512 Age-related nuclear cataract, left eye: Secondary | ICD-10-CM | POA: Insufficient documentation

## 2021-01-16 DIAGNOSIS — I4891 Unspecified atrial fibrillation: Secondary | ICD-10-CM | POA: Insufficient documentation

## 2021-01-16 DIAGNOSIS — I110 Hypertensive heart disease with heart failure: Secondary | ICD-10-CM | POA: Diagnosis not present

## 2021-01-16 DIAGNOSIS — G4730 Sleep apnea, unspecified: Secondary | ICD-10-CM | POA: Diagnosis not present

## 2021-01-16 DIAGNOSIS — I509 Heart failure, unspecified: Secondary | ICD-10-CM | POA: Insufficient documentation

## 2021-01-16 DIAGNOSIS — K219 Gastro-esophageal reflux disease without esophagitis: Secondary | ICD-10-CM | POA: Diagnosis not present

## 2021-01-16 DIAGNOSIS — E039 Hypothyroidism, unspecified: Secondary | ICD-10-CM | POA: Diagnosis not present

## 2021-01-16 HISTORY — PX: CATARACT EXTRACTION W/PHACO: SHX586

## 2021-01-16 LAB — GLUCOSE, CAPILLARY
Glucose-Capillary: 194 mg/dL — ABNORMAL HIGH (ref 70–99)
Glucose-Capillary: 211 mg/dL — ABNORMAL HIGH (ref 70–99)

## 2021-01-16 SURGERY — PHACOEMULSIFICATION, CATARACT, WITH IOL INSERTION
Anesthesia: Monitor Anesthesia Care | Site: Eye | Laterality: Left

## 2021-01-16 MED ORDER — BRIMONIDINE TARTRATE-TIMOLOL 0.2-0.5 % OP SOLN
OPHTHALMIC | Status: DC | PRN
  Administered 2021-01-16: 1 [drp] via OPHTHALMIC

## 2021-01-16 MED ORDER — TETRACAINE HCL 0.5 % OP SOLN
1.0000 [drp] | OPHTHALMIC | Status: DC | PRN
  Administered 2021-01-16 (×3): 1 [drp] via OPHTHALMIC

## 2021-01-16 MED ORDER — MIDAZOLAM HCL 2 MG/2ML IJ SOLN
INTRAMUSCULAR | Status: DC | PRN
  Administered 2021-01-16: 1 mg via INTRAVENOUS

## 2021-01-16 MED ORDER — SIGHTPATH DOSE#1 BSS IO SOLN
INTRAOCULAR | Status: DC | PRN
  Administered 2021-01-16: 15 mL

## 2021-01-16 MED ORDER — FENTANYL CITRATE (PF) 100 MCG/2ML IJ SOLN
INTRAMUSCULAR | Status: DC | PRN
  Administered 2021-01-16: 25 ug via INTRAVENOUS

## 2021-01-16 MED ORDER — SIGHTPATH DOSE#1 BSS IO SOLN
INTRAOCULAR | Status: DC | PRN
  Administered 2021-01-16: 87 mL via OPHTHALMIC

## 2021-01-16 MED ORDER — SIGHTPATH DOSE#1 NA CHONDROIT SULF-NA HYALURON 40-17 MG/ML IO SOLN
INTRAOCULAR | Status: DC | PRN
  Administered 2021-01-16: 1 mL via INTRAOCULAR

## 2021-01-16 MED ORDER — ONDANSETRON HCL 4 MG/2ML IJ SOLN
INTRAMUSCULAR | Status: DC | PRN
  Administered 2021-01-16: 4 mg via INTRAVENOUS

## 2021-01-16 MED ORDER — ACETAMINOPHEN 160 MG/5ML PO SOLN: 325.0000 mg | Freq: Once | ORAL | Status: DC

## 2021-01-16 MED ORDER — ARMC OPHTHALMIC DILATING DROPS
1.0000 "application " | OPHTHALMIC | Status: DC | PRN
  Administered 2021-01-16 (×3): 1 via OPHTHALMIC

## 2021-01-16 MED ORDER — SIGHTPATH DOSE#1 BSS IO SOLN
INTRAOCULAR | Status: DC | PRN
  Administered 2021-01-16: 1 mL

## 2021-01-16 MED ORDER — LACTATED RINGERS IV SOLN: INTRAVENOUS | Status: DC

## 2021-01-16 MED ORDER — ACETAMINOPHEN 325 MG PO TABS: 325.0000 mg | ORAL_TABLET | Freq: Once | ORAL | Status: DC

## 2021-01-16 MED ORDER — MOXIFLOXACIN HCL 0.5 % OP SOLN
OPHTHALMIC | Status: DC | PRN
  Administered 2021-01-16: 0.2 mL via OPHTHALMIC

## 2021-01-16 SURGICAL SUPPLY — 9 items
GLOVE SURG ENC TEXT LTX SZ8 (GLOVE) ×2 IMPLANT
GLOVE SURG TRIUMPH 8.0 PF LTX (GLOVE) ×2 IMPLANT
LENS IOL TECNIS EYHANCE 22.0 (Intraocular Lens) ×1 IMPLANT
NDL FILTER BLUNT 18X1 1/2 (NEEDLE) ×1 IMPLANT
NEEDLE FILTER BLUNT 18X 1/2SAF (NEEDLE) ×1
NEEDLE FILTER BLUNT 18X1 1/2 (NEEDLE) ×1 IMPLANT
PACK EYE AFTER SURG (MISCELLANEOUS) ×2 IMPLANT
WATER STERILE IRR 250ML POUR (IV SOLUTION) ×2 IMPLANT
WIPE NON LINTING 3.25X3.25 (MISCELLANEOUS) ×2 IMPLANT

## 2021-01-16 NOTE — H&P (Signed)
[facility]   Primary Care Physician:  Albina Billet, MD Ophthalmologist: Dr. George Ina  Pre-Procedure History & Physical: HPI:  Annette Foster is a 80 y.o. female here for cataract surgery.   Past Medical History:  Diagnosis Date   Abdominal pain    Allergy    Cancer ([place]) 2017   breast cancer- Left   Cataract    CHF (congestive heart failure) (HCC)    Collagenous colitis    Diabetes mellitus without complication (HCC)    Diarrhea    Diverticulosis    Fatty liver disease, nonalcoholic    Fibrocystic breast    GERD (gastroesophageal reflux disease)    Heart murmur    Hyperlipidemia    Hypertension    Hypothyroidism    IBS (irritable bowel syndrome)    IDA (iron deficiency anemia)    Personal history of radiation therapy 2017   LEFT BREAST CA   PONV (postoperative nausea and vomiting)    Sleep apnea    C-Pap    Past Surgical History:  Procedure Laterality Date   ABDOMINAL HYSTERECTOMY     tah bso   ABDOMINAL SURGERY     APPENDECTOMY     BREAST BIOPSY Bilateral 2016   negative   BREAST 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/2015   DCIS, papillary carcinoma in situ   BREAST BIOPSY Left 05/27/2016   BENIGN MAMMARY EPITHELIUM   BREAST BIOPSY Left 11/20/2017   affirm bx x clip BENIGN MAMMARY EPITHELIUM CONSISTENT WITH RAD THERAPY   BREAST LUMPECTOMY Left 10/17/2015   DCIS and papillary carcinoma insitu, clear margins   CARDIAC SURGERY     has replacement valve   CATARACT EXTRACTION Right    CHOLECYSTECTOMY     COLONOSCOPY WITH PROPOFOL N/A 03/11/2016   Procedure: COLONOSCOPY WITH PROPOFOL;  Surgeon: Manya Silvas, MD;  Location: [facility] ENDOSCOPY;  Service: Endoscopy;  Laterality: N/A;   COLONOSCOPY WITH PROPOFOL N/A 02/18/2020   Procedure: COLONOSCOPY WITH PROPOFOL;  Surgeon: [name], MD;  Location: [facility] ENDOSCOPY;  Service: Endoscopy;  Laterality: N/A;   ESOPHAGOGASTRODUODENOSCOPY (EGD) WITH PROPOFOL N/A 03/11/2016   Procedure: ESOPHAGOGASTRODUODENOSCOPY (EGD) WITH  PROPOFOL;  Surgeon: Manya Silvas, MD;  Location: [facility] ENDOSCOPY;  Service: Endoscopy;  Laterality: N/A;   ESOPHAGOGASTRODUODENOSCOPY (EGD) WITH PROPOFOL N/A 02/18/2020   Procedure: ESOPHAGOGASTRODUODENOSCOPY (EGD) WITH PROPOFOL;  Surgeon: [name], MD;  Location: [facility] ENDOSCOPY;  Service: Endoscopy;  Laterality: N/A;   ESOPHAGOGASTRODUODENOSCOPY (EGD) WITH PROPOFOL N/A 04/25/2020   Procedure: ESOPHAGOGASTRODUODENOSCOPY (EGD) WITH PROPOFOL;  Surgeon: [name], MD;  Location[facility] ENDOSCOPY;  Service: Endoscopy;  Laterality: N/A;   ESOPHAGOGASTRODUODENOSCOPY (EGD) WITH PROPOFOL N/A 05/23/2020   Procedure: ESOPHAGOGASTRODUODENOSCOPY (EGD) WITH PROPOFOL;  Surgeon: [name], MD;  Location: [facility] ENDOSCOPY;  Service: Endoscopy;  Laterality: N/A;   FINGER ARTHROSCOPY WITH CARPOMETACARPEL ([place]) ARTHROPLASTY Right 09/03/2018   Procedure: CARPOMETACARPEL [facility]) ARTHROPLASTY RIGHT THUMB;  Surgeon: Hessie Knows, MD;  Location: [facility] ORS;  Service: Orthopedics;  Laterality: Right;   GANGLION CYST EXCISION Right 09/03/2018   Procedure: REMOVAL GANGLION OF WRIST;  Surgeon: Hessie Knows, MD;  Location: [facility] ORS;  Service: Orthopedics;  Laterality: Right;   HARDWARE REMOVAL Right 09/03/2018   Procedure: HARDWARE REMOVAL RIGHT THUMB;  Surgeon: Hessie Knows, MD;  Location: [facility] ORS;  Service: Orthopedics;  Laterality: Right;  staple removed   JOINT REPLACEMENT Left    TKR   left sinusplasty      MASTECTOMY, PARTIAL Left 10/17/2015   Procedure: MASTECTOMY PARTIAL REVISION;  Surgeon: Leonie Green, MD;  Location: [facility] ORS;  Service: General;  Laterality: Left;   PARTIAL MASTECTOMY WITH NEEDLE LOCALIZATION Left 09/29/2015   Procedure: PARTIAL MASTECTOMY WITH NEEDLE LOCALIZATION;  Surgeon: Leonie Green, MD;  Location: [facility] ORS;  Service: General;  Laterality: Left;   TOTAL ABDOMINAL HYSTERECTOMY W/ BILATERAL SALPINGOOPHORECTOMY      Prior to Admission medications   Medication Sig Start Date End  Date Taking? Authorizing Provider  azelastine (OPTIVAR) 0.05 % ophthalmic solution daily as needed.   Yes [provider]  benzonatate (TESSALON) 200 MG capsule Take 200 mg by mouth 3 (three) times daily as needed for cough.   Yes [provider]  Cholecalciferol (VITAMIN D) 50 MCG (2000 UT) CAPS Take 2,000 Units by mouth daily.   Yes [provider]  clobetasol ointment (TEMOVATE) 1.95 % 1 application two days per week PRN   Yes [provider]  clotrimazole (LOTRIMIN) 1 % cream Apply 1 application topically 2 (two) times daily. Apply to irritated vulvar area twice daily. 10/11/20  Yes Harlin Heys, MD  cyanocobalamin (,VITAMIN B-12,) 1000 MCG/ML injection 1,000 mcg every 30 (thirty) days. 04/23/19  Yes [provider]  dicyclomine (BENTYL) 10 MG capsule Take 10 mg by mouth at bedtime.  05/16/19  Yes [provider]  docusate sodium (COLACE) 100 MG capsule Take 1 capsule by mouth 2 (two) times daily as needed. 01/10/20  Yes [provider]  EPINEPHrine 0.3 mg/0.3 mL IJ SOAJ injection Inject 0.3 mg into the muscle as needed. 05/18/19  Yes [provider]  esomeprazole (NEXIUM) 40 MG capsule Take 40 mg by mouth 2 (two) times daily before a meal.  02/08/16  Yes [provider]  estradiol (ESTRACE) 0.1 MG/GM vaginal cream Place 1 Applicatorful vaginally as needed.   Yes [provider]  fenofibrate 160 MG tablet Take 160 mg by mouth at bedtime.   Yes [provider]  gabapentin (NEURONTIN) 300 MG capsule Take 300 mg by mouth at bedtime.  09/07/16 10/25/36 Yes [provider]  hydrOXYzine (ATARAX/VISTARIL) 25 MG tablet 1 tablet as needed   Yes [provider]  Insulin Degludec (TRESIBA) 100 UNIT/ML SOLN Inject 110 Units into the skin at bedtime.   Yes [provider]  levothyroxine (SYNTHROID, LEVOTHROID) 75 MCG tablet Take 75 mcg by mouth daily before breakfast.    Yes [provider]  magnesium oxide (MAG-OX) 400 MG tablet Take 400 mg by mouth 2 (two) times daily.    Yes [provider]  NOVOLOG FLEXPEN 100 UNIT/ML FlexPen Inject into the skin 3 (three) times daily with meals. Based on sliding scale 08/14/19  Yes [provider]  potassium chloride SA (K-DUR,KLOR-CON) 20 MEQ tablet Take 20 mEq by mouth 2 (two) times daily.    Yes [provider]  saccharomyces boulardii (FLORASTOR) 250 MG capsule Take 250 mg by mouth 2 (two) times daily.    Yes [provider]  simvastatin (ZOCOR) 20 MG tablet Take 20 mg by mouth daily.   Yes [provider]  spironolactone (ALDACTONE) 25 MG tablet TAKE 1 TABLET (25 MG TOTAL) BY MOUTH DAILY. 01/11/21  Yes [name], MD  Tirzepatide [facility]) Inject into the skin once a week.   Yes [provider]  torsemide (DEMADEX) 20 MG tablet TAKE 1 TABLET BY MOUTH EVERY DAY 12/27/20  Yes End, Harrell Gave, MD  valACYclovir (VALTREX) 500 MG tablet Take 500 mg by mouth See admin instructions. For fever blisters take 572m twice a day  as needed 01/06/17  Yes [provider]  escitalopram (LEXAPRO) 5 MG tablet Take 5 mg by mouth at bedtime.    [provider]  nadolol (CORGARD) 20 MG tablet Take 0.5 tablets (10 mg total) by mouth daily. Patient not taking: Reported on 01/16/2021 11/30/20   End, Harrell Gave, MD    Allergies as of 12/28/2020 - Review Complete 11/30/2020  Allergen Reaction Noted   Codeine Itching and Nausea And Vomiting 06/28/2014   Demeclocycline Rash 04/10/2012   Demerol [meperidine] Itching and Nausea And Vomiting 06/28/2014   Hydrocodone Itching and Nausea And Vomiting 08/24/2018   Oxycodone Itching and Nausea And Vomiting 09/26/2015   Pentazocine Itching and Nausea And Vomiting 04/10/2012   Tetracyclines & related Rash 06/28/2014   Fentanyl Itching and Nausea And Vomiting 06/28/2014    Family History  Problem Relation Age of Onset   Breast  cancer Paternal Grandmother    Colon cancer Father    Diabetes Sister    Diabetes Brother    Heart disease Brother    Prostate cancer Brother    Colon cancer Maternal Uncle    Prostate cancer Brother    Bladder Cancer Brother    Leukemia Mother        all   Ovarian cancer Neg Hx    Kidney cancer Neg Hx     Social History   Socioeconomic History   Marital status: Married    Spouse name: Not on file   Number of children: Not on file   Years of education: Not on file   Highest education level: Not on file  Occupational History   Not on file  Tobacco Use   Smoking status: Never   Smokeless tobacco: Never  Vaping Use   Vaping Use: Never used  Substance and Sexual Activity   Alcohol use: No    Alcohol/week: 0.0 standard drinks   Drug use: No   Sexual activity: Not Currently    Birth control/protection: Surgical  Other Topics Concern   Not on file  Social History Narrative   Not on file   Social Determinants of Health   Financial Resource Strain: Not on file  Food Insecurity: Not on file  Transportation Needs: Not on file  Physical Activity: Not on file  Stress: Not on file  Social Connections: Not on file  Intimate Partner Violence: Not on file    Review of Systems: See HPI, otherwise negative ROS  Physical Exam: BP 112/67   Pulse (!) 54   Temp (!) 97.3 F (36.3 C) (Temporal)   Resp 16   Ht 5' 3"  (1.6 m)   Wt 73.9 kg   LMP  (LMP Unknown)   SpO2 97%   BMI 28.87 kg/m  General:   Alert, cooperative in NAD Head:  Normocephalic and atraumatic. Respiratory:  Normal work of breathing. Cardiovascular:  RRR  Impression/Plan: Annette Foster is here for cataract surgery.  Risks, benefits, limitations, and alternatives regarding cataract surgery have been reviewed with the patient.  Questions have been answered.  All parties agreeable.   Birder Robson, MD  01/16/2021, 7:18 AM

## 2021-01-16 NOTE — Anesthesia Preprocedure Evaluation (Signed)
Anesthesia Evaluation  Patient identified by MRN, date of birth, ID band Patient awake    Reviewed: Allergy & Precautions, H&P , NPO status , Patient's Chart, lab work & pertinent test results  History of Anesthesia Complications (+) PONV  Airway Mallampati: II  TM Distance: >3 FB Neck ROM: full    Dental  (+) Partial Lower, Upper Dentures   Pulmonary sleep apnea ,    Pulmonary exam normal breath sounds clear to auscultation       Cardiovascular hypertension, +CHF  + dysrhythmias Atrial Fibrillation + Valvular Problems/Murmurs  Rhythm:regular Rate:Normal     Neuro/Psych    GI/Hepatic GERD  ,(+) Hepatitis -  Endo/Other  diabetes, Insulin DependentHypothyroidism   Renal/GU      Musculoskeletal   Abdominal   Peds  Hematology   Anesthesia Other Findings   Reproductive/Obstetrics                             Anesthesia Physical Anesthesia Plan  ASA: 3  Anesthesia Plan: MAC   Post-op Pain Management:    Induction:   PONV Risk Score and Plan: 3 and Treatment may vary due to age or medical condition, TIVA and Midazolam  Airway Management Planned:   Additional Equipment:   Intra-op Plan:   Post-operative Plan:   Informed Consent: I have reviewed the patients History and Physical, chart, labs and discussed the procedure including the risks, benefits and alternatives for the proposed anesthesia with the patient or authorized representative who has indicated his/her understanding and acceptance.     Dental Advisory Given  Plan Discussed with: CRNA  Anesthesia Plan Comments:         Anesthesia Quick Evaluation

## 2021-01-16 NOTE — Anesthesia Postprocedure Evaluation (Signed)
Anesthesia Post Note  Patient: Annette Foster  Procedure(s) Performed: CATARACT EXTRACTION PHACO AND INTRAOCULAR LENS PLACEMENT (IOC) LEFT DIABETIC 8.46 00:59.8 (Left: Eye)     Patient location during evaluation: PACU Anesthesia Type: MAC Level of consciousness: awake and alert and oriented Pain management: satisfactory to patient Vital Signs Assessment: post-procedure vital signs reviewed and stable Respiratory status: spontaneous breathing, nonlabored ventilation and respiratory function stable Cardiovascular status: blood pressure returned to baseline and stable Postop Assessment: Adequate PO intake and No signs of nausea or vomiting Anesthetic complications: no   No notable events documented.  Raliegh Ip

## 2021-01-16 NOTE — Op Note (Signed)
PREOPERATIVE DIAGNOSIS:  Nuclear sclerotic cataract of the left eye.   POSTOPERATIVE DIAGNOSIS:  Nuclear sclerotic cataract of the left eye.   OPERATIVE PROCEDURE:ORPROCALL@   SURGEON:  Birder Robson, MD.   ANESTHESIA:  Anesthesiologist: Ronelle Nigh, MD CRNA: Silvana Newness, CRNA  1.      Managed anesthesia care. 2.     0.63m of Shugarcaine was instilled following the paracentesis   COMPLICATIONS:  None.   TECHNIQUE:   Stop and chop   DESCRIPTION OF PROCEDURE:  The patient was examined and consented in the preoperative holding area where the aforementioned topical anesthesia was applied to the left eye and then brought back to the Operating Room where the left eye was prepped and draped in the usual sterile ophthalmic fashion and a lid speculum was placed. A paracentesis was created with the side port blade and the anterior chamber was filled with viscoelastic. A near clear corneal incision was performed with the steel keratome. A continuous curvilinear capsulorrhexis was performed with a cystotome followed by the capsulorrhexis forceps. Hydrodissection and hydrodelineation were carried out with BSS on a blunt cannula. The lens was removed in a stop and chop  technique and the remaining cortical material was removed with the irrigation-aspiration handpiece. The capsular bag was inflated with viscoelastic and the Technis [id number] lens was placed in the capsular bag without complication. The remaining viscoelastic was removed from the eye with the irrigation-aspiration handpiece. The wounds were hydrated. The anterior chamber was flushed with BSS and the eye was inflated to physiologic pressure. 0.140mVigamox was placed in the anterior chamber. The wounds were found to be water tight. The eye was dressed with Combigan. The patient was given protective glasses to wear throughout the day and a shield with which to sleep tonight. The patient was also given drops with which to begin a drop regimen  today and will follow-up with me in one day. Implant Name Type Inv. Item Serial No. Manufacturer Lot No. LRB No. Used Action  LENS IOL TECNIS EYHANCE 22.0 - [id number] Lens LENS IOL TECNIS EYHANCE 22.0 [id number]   Left 1 Implanted    Procedure(s): CATARACT EXTRACTION PHACO AND INTRAOCULAR LENS PLACEMENT (IOC) LEFT DIABETIC 8.46 00:59.8 (Left)  Electronically signed: WiBirder Robson2/07/2020 7:57 AM

## 2021-01-16 NOTE — Transfer of Care (Signed)
Immediate Anesthesia Transfer of Care Note  Patient: Annette Foster  Procedure(s) Performed: CATARACT EXTRACTION PHACO AND INTRAOCULAR LENS PLACEMENT (IOC) LEFT DIABETIC 8.46 00:59.8 (Left: Eye)  Patient Location: PACU  Anesthesia Type: MAC  Level of Consciousness: awake, alert  and patient cooperative  Airway and Oxygen Therapy: Patient Spontanous Breathing and Patient connected to supplemental oxygen  Post-op Assessment: Post-op Vital signs reviewed, Patient's Cardiovascular Status Stable, Respiratory Function Stable, Patent Airway and No signs of Nausea or vomiting  Post-op Vital Signs: Reviewed and stable  Complications: No notable events documented.

## 2021-01-17 ENCOUNTER — Encounter: Payer: Self-pay | Admitting: Ophthalmology

## 2021-01-25 ENCOUNTER — Other Ambulatory Visit: Payer: Self-pay

## 2021-01-25 ENCOUNTER — Encounter: Payer: Self-pay | Admitting: Podiatry

## 2021-01-25 ENCOUNTER — Ambulatory Visit (INDEPENDENT_AMBULATORY_CARE_PROVIDER_SITE_OTHER): Payer: Medicare PPO | Admitting: Podiatry

## 2021-01-25 DIAGNOSIS — M79675 Pain in left toe(s): Secondary | ICD-10-CM | POA: Diagnosis not present

## 2021-01-25 DIAGNOSIS — E1142 Type 2 diabetes mellitus with diabetic polyneuropathy: Secondary | ICD-10-CM

## 2021-01-25 DIAGNOSIS — M79674 Pain in right toe(s): Secondary | ICD-10-CM

## 2021-01-25 DIAGNOSIS — B351 Tinea unguium: Secondary | ICD-10-CM

## 2021-01-25 NOTE — Progress Notes (Signed)
This patient returns to my office for at risk foot care.  This patient requires this care by a professional since this patient will be at risk due to having diabetes  This patient is unable to cut nails himself since the patient cannot reach his nails.These nails are painful walking and wearing shoes.  This patient presents for at risk foot care today.    General Appearance  Alert, conversant and in no acute stress.  Vascular  Dorsalis pedis and posterior tibial  pulses are palpable  bilaterally.  Capillary return is within normal limits  bilaterally. Temperature is within normal limits  bilaterally.  Neurologic  Senn-Weinstein monofilament test dimininished  bilaterally. Muscle power within normal limits bilaterally.  Nails Thick disfigured discolored nails with subungual debris  from hallux to fifth toes bilaterally. No evidence of bacterial infection or drainage bilaterally.  Orthopedic  No limitations of motion  feet .  No crepitus or effusions noted.  No bony pathology or digital deformities noted. HAV  B/L.  Hammer toes  B/L.  Midfoot DJD  B/L.  Skin  normotropic skin with no porokeratosis noted bilaterally.  No signs of infections or ulcers noted.     Onychomycosis  Pain in right toes  Pain in left toes  Consent was obtained for treatment procedures.   Mechanical debridement of nails 1-5  bilaterally performed with a nail nipper.  Filed with dremel without incident.     Return office visit    3 months                Told patient to return for periodic foot care and evaluation due to potential at risk complications.   Boneta Lucks DPM

## 2021-01-31 ENCOUNTER — Ambulatory Visit
Admission: RE | Admit: 2021-01-31 | Discharge: 2021-01-31 | Disposition: A | Discharge status: Home / Self Care | Payer: Medicare PPO | Source: Ambulatory Visit | Attending: General Surgery | Admitting: General Surgery

## 2021-01-31 ENCOUNTER — Other Ambulatory Visit: Payer: Self-pay | Admitting: General Surgery

## 2021-01-31 ENCOUNTER — Other Ambulatory Visit: Payer: Self-pay

## 2021-01-31 DIAGNOSIS — N644 Mastodynia: Secondary | ICD-10-CM

## 2021-01-31 DIAGNOSIS — Z853 Personal history of malignant neoplasm of breast: Secondary | ICD-10-CM | POA: Insufficient documentation

## 2021-01-31 DIAGNOSIS — Z1231 Encounter for screening mammogram for malignant neoplasm of breast: Secondary | ICD-10-CM | POA: Insufficient documentation

## 2021-01-31 DIAGNOSIS — N6310 Unspecified lump in the right breast, unspecified quadrant: Secondary | ICD-10-CM

## 2021-01-31 DIAGNOSIS — R928 Other abnormal and inconclusive findings on diagnostic imaging of breast: Secondary | ICD-10-CM

## 2021-02-07 ENCOUNTER — Other Ambulatory Visit: Payer: Self-pay

## 2021-02-07 ENCOUNTER — Ambulatory Visit (INDEPENDENT_AMBULATORY_CARE_PROVIDER_SITE_OTHER): Payer: Medicare PPO | Admitting: Gastroenterology

## 2021-02-07 ENCOUNTER — Encounter: Payer: Self-pay | Admitting: Gastroenterology

## 2021-02-07 VITALS — BP 100/65 | HR 134 | Temp 97.0°F | Ht 63.0 in | Wt 164.2 lb

## 2021-02-07 DIAGNOSIS — R188 Other ascites: Secondary | ICD-10-CM | POA: Diagnosis not present

## 2021-02-07 DIAGNOSIS — K7460 Unspecified cirrhosis of liver: Secondary | ICD-10-CM | POA: Diagnosis not present

## 2021-02-07 NOTE — Progress Notes (Signed)
That is not an aswer.    Primary Care Physician: Albina Billet, MD  Primary Gastroenterologist:  Dr. [name]  Chief Complaint  Patient presents with   Cirrhosis    6 month follow up    HPI: Annette Foster is a 80 y.o. female here for follow-up with a history of cirrhosis.  The patient states that she's been doing well without any issues.  The patient has nonalcoholic cirrhosis and reports that she is been under a lot of pressure recently with her husband been being in rehabilitation with recurrent admissions to the [place] for chest pains and gallbladder problems. She denies any jaundice fevers chills nausea or vomiting.  She also denies any abdominal distention.  Past Medical History:  Diagnosis Date   Abdominal pain    Allergy    Cancer ([place]) 2017   breast cancer- Left   Cataract    CHF (congestive heart failure) (HCC)    Collagenous colitis    Diabetes mellitus without complication (HCC)    Diarrhea    Diverticulosis    Fatty liver disease, nonalcoholic    Fibrocystic breast    GERD (gastroesophageal reflux disease)    Heart murmur    Hyperlipidemia    Hypertension    Hypothyroidism    IBS (irritable bowel syndrome)    IDA (iron deficiency anemia)    Personal history of radiation therapy 2017   LEFT BREAST CA   PONV (postoperative nausea and vomiting)    Sleep apnea    C-Pap    Current Outpatient Medications  Medication Sig Dispense Refill   azelastine (OPTIVAR) 0.05 % ophthalmic solution daily as needed.     Cholecalciferol (VITAMIN D) 50 MCG (2000 UT) CAPS Take 2,000 Units by mouth daily.     clobetasol ointment (TEMOVATE) 5.05 % 1 application two days per week PRN     clotrimazole (LOTRIMIN) 1 % cream Apply 1 application topically 2 (two) times daily. Apply to irritated vulvar area twice daily. 60 g 6   cyanocobalamin (,VITAMIN B-12,) 1000 MCG/ML injection 1,000 mcg every 30 (thirty) days.     dicyclomine (BENTYL) 10 MG capsule Take 10 mg by mouth at  bedtime.      docusate sodium (COLACE) 100 MG capsule Take 1 capsule by mouth 2 (two) times daily as needed.     EPINEPHrine 0.3 mg/0.3 mL IJ SOAJ injection Inject 0.3 mg into the muscle as needed.     escitalopram (LEXAPRO) 5 MG tablet Take 5 mg by mouth at bedtime.     esomeprazole (NEXIUM) 40 MG capsule Take 40 mg by mouth 2 (two) times daily before a meal.      estradiol (ESTRACE) 0.1 MG/GM vaginal cream Place 1 Applicatorful vaginally as needed.     fenofibrate 160 MG tablet Take 160 mg by mouth at bedtime.     gabapentin (NEURONTIN) 300 MG capsule Take 300 mg by mouth at bedtime.      hydrOXYzine (ATARAX/VISTARIL) 25 MG tablet 1 tablet as needed     Insulin Degludec (TRESIBA) 100 UNIT/ML SOLN Inject 110 Units into the skin at bedtime.     levothyroxine (SYNTHROID, LEVOTHROID) 75 MCG tablet Take 75 mcg by mouth daily before breakfast.      magnesium oxide (MAG-OX) 400 MG tablet Take 400 mg by mouth 2 (two) times daily.      NOVOLOG FLEXPEN 100 UNIT/ML FlexPen Inject into the skin 3 (three) times daily with meals. Based on sliding scale  potassium chloride SA (K-DUR,KLOR-CON) 20 MEQ tablet Take 20 mEq by mouth 2 (two) times daily.      saccharomyces boulardii (FLORASTOR) 250 MG capsule Take 250 mg by mouth 2 (two) times daily.      simvastatin (ZOCOR) 20 MG tablet Take 20 mg by mouth daily.     spironolactone (ALDACTONE) 25 MG tablet TAKE 1 TABLET (25 MG TOTAL) BY MOUTH DAILY. 90 tablet 2   Tirzepatide [facility] ) Inject into the skin once a week.     torsemide (DEMADEX) 20 MG tablet TAKE 1 TABLET BY MOUTH EVERY DAY 90 tablet 0   valACYclovir (VALTREX) 500 MG tablet Take 500 mg by mouth See admin instructions. For fever blisters take 572m twice a day as needed  1   benzonatate (TESSALON) 200 MG capsule Take 200 mg by mouth 3 (three) times daily as needed for cough. (Patient not taking: Reported on 02/07/2021)     nadolol (CORGARD) 20 MG tablet Take 0.5 tablets (10 mg total) by mouth  daily. (Patient not taking: Reported on 01/16/2021) 30 tablet 2   No current facility-administered medications for this visit.    Allergies as of 02/07/2021 - Review Complete 02/07/2021  Allergen Reaction Noted   Codeine Itching and Nausea And Vomiting 06/28/2014   Demeclocycline Rash 04/10/2012   Demerol [meperidine] Itching and Nausea And Vomiting 06/28/2014   Hydrocodone Itching and Nausea And Vomiting 08/24/2018   Oxycodone Itching and Nausea And Vomiting 09/26/2015   Pentazocine Itching and Nausea And Vomiting 04/10/2012   Tetracyclines & related Rash 06/28/2014   Fentanyl Itching and Nausea And Vomiting 06/28/2014    ROS:  General: Negative for anorexia, weight loss, fever, chills, fatigue, weakness. ENT: Negative for hoarseness, difficulty swallowing , nasal congestion. CV: Negative for chest pain, angina, palpitations, dyspnea on exertion, peripheral edema.  Respiratory: Negative for dyspnea at rest, dyspnea on exertion, cough, sputum, wheezing.  GI: See history of present illness. GU:  Negative for dysuria, hematuria, urinary incontinence, urinary frequency, nocturnal urination.  Endo: Negative for unusual weight change.    Physical Examination:   BP 100/65 (BP Location: Left Arm, Patient Position: Sitting, Cuff Size: Normal)    Pulse (!) 134    Temp (!) 97 F (36.1 C) (Temporal)    Ht 5' 3"  (1.6 m)    Wt 164 lb 3.2 oz (74.5 kg)    LMP  (LMP Unknown)    BMI 29.09 kg/m   General: Well-nourished, well-developed in no acute distress.  Eyes: No icterus. Conjunctivae pink. Neuro: Alert and oriented x 3.  Grossly intact. Skin: Warm and dry, no jaundice.   Psych: Alert and cooperative, normal mood and affect.  Labs:    Imaging Studies: MM 3D SCREEN BREAST BILATERAL  Result Date: 01/31/2021 CLINICAL DATA:  Screening. EXAM: DIGITAL SCREENING BILATERAL MAMMOGRAM WITH TOMOSYNTHESIS AND CAD TECHNIQUE: Bilateral screening digital craniocaudal and mediolateral oblique  mammograms were obtained. Bilateral screening digital breast tomosynthesis was performed. The images were evaluated with computer-aided detection. COMPARISON:  Previous exam(s). ACR Breast Density Category c: The breast tissue is heterogeneously dense, which may obscure small masses. FINDINGS: In the right breast , a possible mass requires further evaluation. This possible mass is seen within the slightly inner RIGHT breast, cc slice 30. In the left breast , focal tenderness at the 3 o'clock position of the LEFT breast requires further evaluation. IMPRESSION: Further evaluation is suggested for possible mass in the right breast. Further evaluation is suggested for focal tenderness at the 3 o'clock  position of the LEFT breast in the left breast, per protocol. RECOMMENDATION: Diagnostic mammogram and possibly ultrasound of both breasts. (Code:[id number]) The patient will be contacted regarding the findings, and additional imaging will be scheduled. BI-RADS CATEGORY  0: Incomplete. Need additional imaging evaluation and/or prior mammograms for comparison. Electronically Signed   By: Franki Cabot M.D.   On: 01/31/2021 11:22   Assessment and Plan:   Annette Foster is a 80 y.o. y/o female who comes in with a history of nonalcoholic cirrhosis. The patient is doing well and will be set up for a right upper quadrant ultrasound.  The patient has been told that she will need another one in 6 months.  The patient has been  explained the plan and agrees with it.      [name], MD. Marval Regal    Note: This dictation was prepared with Dragon dictation along with smaller phrase technology. Any transcriptional errors that result from this process are unintentional.

## 2021-02-13 ENCOUNTER — Ambulatory Visit
Admission: RE | Admit: 2021-02-13 | Discharge: 2021-02-13 | Disposition: A | Discharge status: Home / Self Care | Payer: Medicare PPO | Source: Ambulatory Visit | Attending: Gastroenterology | Admitting: Gastroenterology

## 2021-02-13 DIAGNOSIS — R188 Other ascites: Secondary | ICD-10-CM | POA: Diagnosis present

## 2021-02-13 DIAGNOSIS — K7460 Unspecified cirrhosis of liver: Secondary | ICD-10-CM | POA: Insufficient documentation

## 2021-02-19 ENCOUNTER — Other Ambulatory Visit: Payer: Self-pay

## 2021-02-19 ENCOUNTER — Ambulatory Visit
Admission: RE | Admit: 2021-02-19 | Discharge: 2021-02-19 | Disposition: A | Discharge status: Home / Self Care | Payer: Medicare PPO | Source: Ambulatory Visit | Attending: General Surgery | Admitting: General Surgery

## 2021-02-19 DIAGNOSIS — N6310 Unspecified lump in the right breast, unspecified quadrant: Secondary | ICD-10-CM | POA: Diagnosis present

## 2021-02-19 DIAGNOSIS — N644 Mastodynia: Secondary | ICD-10-CM

## 2021-02-19 DIAGNOSIS — R928 Other abnormal and inconclusive findings on diagnostic imaging of breast: Secondary | ICD-10-CM | POA: Diagnosis not present

## 2021-03-02 ENCOUNTER — Other Ambulatory Visit: Payer: Self-pay | Admitting: Internal Medicine

## 2021-03-02 ENCOUNTER — Telehealth: Payer: Self-pay | Admitting: Internal Medicine

## 2021-03-02 NOTE — Telephone Encounter (Signed)
Patient c/o Palpitations:  High priority if patient c/o lightheadedness, shortness of breath, or chest pain  How long have you had palpitations/irregular HR/ Afib? Are you having the symptoms now? Right after Christmas - not now but has happened today   Are you currently experiencing lightheadedness, SOB or CP?  no  Do you have a history of afib (atrial fibrillation) or irregular heart rhythm? yes  Have you checked your BP or HR? (document readings if available): ~90/61 HR 103  Are you experiencing any other symptoms? Fast heartbeat / flutter

## 2021-03-02 NOTE — Telephone Encounter (Signed)
Spoke with the patient. Patient sts that she feels that she is back in NSR her current HR 66 bpm. Patient is currently asymptomatic. Patient reports increased daily episodes of AFIB over the last 1-2 weeks. The episodes are short in duration lasting 10-15 mins. She sts that her HR will get up to the 110-120'sbpms at times. Pt sts that she converts to NSR spontaneously. Patient is not on anti-coag due to hx of bleed. She was told by EP Dr. Quentin Ore that she would not be a candidate for watchman. Patient is scheduled in March. Adv the patient that I would recommend that she be seen sooner.  No APP appt available until  mid Feb and Dr. Saunders Revel is on vacation.  Pt scheduled with Dr. Fletcher Anon 03/08/21 @ 1:40 pm with Dr. Fletcher Anon. Advised the patient to contact the office in the interim if symptoms develop. Patient verbalized understanding and voiced appreciation for the call back.

## 2021-03-08 ENCOUNTER — Other Ambulatory Visit: Payer: Self-pay

## 2021-03-08 ENCOUNTER — Ambulatory Visit (INDEPENDENT_AMBULATORY_CARE_PROVIDER_SITE_OTHER): Payer: Medicare PPO | Admitting: Cardiovascular Disease

## 2021-03-08 ENCOUNTER — Encounter: Payer: Self-pay | Admitting: Cardiovascular Disease

## 2021-03-08 VITALS — BP 100/60 | HR 125 | Ht 63.0 in | Wt 159.1 lb

## 2021-03-08 DIAGNOSIS — E785 Hyperlipidemia, unspecified: Secondary | ICD-10-CM | POA: Diagnosis not present

## 2021-03-08 DIAGNOSIS — I5032 Chronic diastolic (congestive) heart failure: Secondary | ICD-10-CM

## 2021-03-08 DIAGNOSIS — I480 Paroxysmal atrial fibrillation: Secondary | ICD-10-CM

## 2021-03-08 MED ORDER — FLECAINIDE ACETATE 50 MG PO TABS: 50.0000 mg | ORAL_TABLET | Freq: Two times a day (BID) | ORAL | 1 refills | Status: DC

## 2021-03-08 NOTE — Progress Notes (Signed)
Cardiology Office Note   Date:  03/09/2021   ID:  [name], [name] Jul 02, 1940, MRN [id number]  PCP:  Albina Billet, MD  Cardiologist:  Dr. Saunders Revel  Chief Complaint  Patient presents with   Other    PAF. Meds reviewed verbally with pt.      History of Present Illness: Annette Foster is a 81 y.o. female who presents for a follow-up visit regarding atrial fibrillation.  She is a patient of Dr. Saunders Revel with history of paroxysmal atrial fibrillation complicated by GI bleed on anticoagulation, essential hypertension, hyperlipidemia, Karlene Lineman with esophageal varices, seizure disorder, obstructive sleep apnea and left breast cancer status postmastectomy.  Left atrial appendage morphology precluded watchman placement.  There is reported history of mitral valve surgery in the remote past.  She was added to my schedule today due to persistent tachycardia over the last few weeks.  She noted that her heart rate is staying around 127 bpm.  She is having significant palpitations and has difficulty sleeping at night because of that.  No chest pain or worsening dyspnea.  Past Medical History:  Diagnosis Date   Abdominal pain    Allergy    Cancer ([place]) 2017   breast cancer- Left   Cataract    CHF (congestive heart failure) (HCC)    Collagenous colitis    Diabetes mellitus without complication (HCC)    Diarrhea    Diverticulosis    Fatty liver disease, nonalcoholic    Fibrocystic breast    GERD (gastroesophageal reflux disease)    Heart murmur    Hyperlipidemia    Hypertension    Hypothyroidism    IBS (irritable bowel syndrome)    IDA (iron deficiency anemia)    Personal history of radiation therapy 2017   LEFT BREAST CA   PONV (postoperative nausea and vomiting)    Sleep apnea    C-Pap    Past Surgical History:  Procedure Laterality Date   ABDOMINAL HYSTERECTOMY     tah bso   ABDOMINAL SURGERY     APPENDECTOMY     BREAST BIOPSY Bilateral 2016   negative   BREAST BIOPSY  Left 09/11/2015   DCIS, papillary carcinoma in situ   BREAST BIOPSY Left 05/27/2016   BENIGN MAMMARY EPITHELIUM   BREAST BIOPSY Left 11/20/2017   affirm bx x clip BENIGN MAMMARY EPITHELIUM CONSISTENT WITH RAD THERAPY   BREAST EXCISIONAL BIOPSY Right    NEG 1980's   BREAST LUMPECTOMY Left 10/17/2015   DCIS and papillary carcinoma insitu, clear margins   CARDIAC SURGERY     has replacement valve   CATARACT EXTRACTION Right    CATARACT EXTRACTION W/PHACO Left 01/16/2021   Procedure: CATARACT EXTRACTION PHACO AND INTRAOCULAR LENS PLACEMENT (IOC) LEFT DIABETIC 8.46 00:59.8;  Surgeon: Birder Robson, MD;  Location: [place];  Service: Ophthalmology;  Laterality: Left;   CHOLECYSTECTOMY     COLONOSCOPY WITH PROPOFOL N/A 03/11/2016   Procedure: COLONOSCOPY WITH PROPOFOL;  Surgeon: Manya Silvas, MD;  Location: [facility] ENDOSCOPY;  Service: Endoscopy;  Laterality: N/A;   COLONOSCOPY WITH PROPOFOL N/A 02/18/2020   Procedure: COLONOSCOPY WITH PROPOFOL;  Surgeon: [name], MD;  Location: [facility] ENDOSCOPY;  Service: Endoscopy;  Laterality: N/A;   ESOPHAGOGASTRODUODENOSCOPY (EGD) WITH PROPOFOL N/A 03/11/2016   Procedure: ESOPHAGOGASTRODUODENOSCOPY (EGD) WITH PROPOFOL;  Surgeon: Manya Silvas, MD;  Location: [facility] ENDOSCOPY;  Service: Endoscopy;  Laterality: N/A;   ESOPHAGOGASTRODUODENOSCOPY (EGD) WITH PROPOFOL N/A 02/18/2020   Procedure: ESOPHAGOGASTRODUODENOSCOPY (EGD) WITH PROPOFOL;  Surgeon: [name], MD;  Location: [facility] ENDOSCOPY;  Service: Endoscopy;  Laterality: N/A;   ESOPHAGOGASTRODUODENOSCOPY (EGD) WITH PROPOFOL N/A 04/25/2020   Procedure: ESOPHAGOGASTRODUODENOSCOPY (EGD) WITH PROPOFOL;  Surgeon: [name], MD;  Location: [facility] ENDOSCOPY;  Service: Endoscopy;  Laterality: N/A;   ESOPHAGOGASTRODUODENOSCOPY (EGD) WITH PROPOFOL N/A 05/23/2020   Procedure: ESOPHAGOGASTRODUODENOSCOPY (EGD) WITH PROPOFOL;  Surgeon: [name], MD;  Location: [facility] ENDOSCOPY;  Service:  Endoscopy;  Laterality: N/A;   FINGER ARTHROSCOPY WITH CARPOMETACARPEL ([place]) ARTHROPLASTY Right 09/03/2018   Procedure: CARPOMETACARPEL [facility]) ARTHROPLASTY RIGHT THUMB;  Surgeon: Hessie Knows, MD;  Location: [facility] ORS;  Service: Orthopedics;  Laterality: Right;   GANGLION CYST EXCISION Right 09/03/2018   Procedure: REMOVAL GANGLION OF WRIST;  Surgeon: Hessie Knows, MD;  Location: [facility] ORS;  Service: Orthopedics;  Laterality: Right;   HARDWARE REMOVAL Right 09/03/2018   Procedure: HARDWARE REMOVAL RIGHT THUMB;  Surgeon: Hessie Knows, MD;  Location: [facility] ORS;  Service: Orthopedics;  Laterality: Right;  staple removed   JOINT REPLACEMENT Left    TKR   left sinusplasty      MASTECTOMY, PARTIAL Left 10/17/2015   Procedure: MASTECTOMY PARTIAL REVISION;  Surgeon: Leonie Green, MD;  Location: [facility] ORS;  Service: General;  Laterality: Left;   PARTIAL MASTECTOMY WITH NEEDLE LOCALIZATION Left 09/29/2015   Procedure: PARTIAL MASTECTOMY WITH NEEDLE LOCALIZATION;  Surgeon: Leonie Green, MD;  Location: [facility] ORS;  Service: General;  Laterality: Left;   TOTAL ABDOMINAL HYSTERECTOMY W/ BILATERAL SALPINGOOPHORECTOMY       Current Outpatient Medications  Medication Sig Dispense Refill   azelastine (OPTIVAR) 0.05 % ophthalmic solution daily as needed.     benzonatate (TESSALON) 200 MG capsule Take 200 mg by mouth 3 (three) times daily as needed for cough.     Cholecalciferol (VITAMIN D) 50 MCG (2000 UT) CAPS Take 2,000 Units by mouth daily.     clobetasol ointment (TEMOVATE) 0.51 % 1 application two days per week PRN     clotrimazole (LOTRIMIN) 1 % cream Apply 1 application topically 2 (two) times daily. Apply to irritated vulvar area twice daily. 60 g 6   cyanocobalamin (,VITAMIN B-12,) 1000 MCG/ML injection 1,000 mcg every 30 (thirty) days.     dicyclomine (BENTYL) 10 MG capsule Take 10 mg by mouth at bedtime.      docusate sodium (COLACE) 100 MG capsule Take 1 capsule by mouth 2 (two) times  daily as needed.     EPINEPHrine 0.3 mg/0.3 mL IJ SOAJ injection Inject 0.3 mg into the muscle as needed.     escitalopram (LEXAPRO) 5 MG tablet Take 5 mg by mouth at bedtime.     esomeprazole (NEXIUM) 40 MG capsule Take 40 mg by mouth 2 (two) times daily before a meal.      estradiol (ESTRACE) 0.1 MG/GM vaginal cream Place 1 Applicatorful vaginally as needed.     fenofibrate 160 MG tablet Take 160 mg by mouth at bedtime.     flecainide (TAMBOCOR) 50 MG tablet Take 1 tablet (50 mg total) by mouth 2 (two) times daily. 60 tablet 1   gabapentin (NEURONTIN) 300 MG capsule Take 300 mg by mouth at bedtime.      hydrOXYzine (ATARAX/VISTARIL) 25 MG tablet 1 tablet as needed     Insulin Degludec (TRESIBA) 100 UNIT/ML SOLN Inject 120 Units into the skin at bedtime.     levothyroxine (SYNTHROID, LEVOTHROID) 75 MCG tablet Take 75 mcg by mouth daily before breakfast.      magnesium oxide (MAG-OX) 400 MG  tablet Take 400 mg by mouth 2 (two) times daily.      nadolol (CORGARD) 20 MG tablet TAKE 1/2 TABLET BY MOUTH DAILY 45 tablet 1   NOVOLOG FLEXPEN 100 UNIT/ML FlexPen Inject into the skin 3 (three) times daily with meals. Based on sliding scale     potassium chloride SA (K-DUR,KLOR-CON) 20 MEQ tablet Take 20 mEq by mouth 2 (two) times daily.      saccharomyces boulardii (FLORASTOR) 250 MG capsule Take 250 mg by mouth 2 (two) times daily.      simvastatin (ZOCOR) 20 MG tablet Take 20 mg by mouth daily.     spironolactone (ALDACTONE) 25 MG tablet TAKE 1 TABLET (25 MG TOTAL) BY MOUTH DAILY. 90 tablet 2   Tirzepatide [facility]) Inject into the skin once a week.     torsemide (DEMADEX) 20 MG tablet TAKE 1 TABLET BY MOUTH EVERY DAY 90 tablet 0   valACYclovir (VALTREX) 500 MG tablet Take 500 mg by mouth See admin instructions. For fever blisters take 555m twice a day as needed  1   No current facility-administered medications for this visit.    Allergies:   Codeine, Demeclocycline, Demerol [meperidine],  Hydrocodone, Oxycodone, Pentazocine, Tetracyclines & related, and Fentanyl    Social History:  The patient  reports that she has never smoked. She has never used smokeless tobacco. She reports that she does not drink alcohol and does not use drugs.   Family History:  The patient's family history includes Bladder Cancer in her brother; Breast cancer in her paternal grandmother; Colon cancer in her father and maternal uncle; Diabetes in her brother and sister; Heart disease in her brother; Leukemia in her mother; Prostate cancer in her brother and brother.    ROS:  Please see the history of present illness.   Otherwise, review of systems are positive for none.   All other systems are reviewed and negative.    PHYSICAL EXAM: VS:  BP 100/60 (BP Location: Right Arm, Patient Position: Sitting, Cuff Size: Normal)    Pulse (!) 125    Ht 5' 3"  (1.6 m)    Wt 159 lb 2 oz (72.2 kg)    LMP  (LMP Unknown)    SpO2 98%    BMI 28.19 kg/m  , BMI Body mass index is 28.19 kg/m. GEN: Well nourished, well developed, in no acute distress  HEENT: normal  Neck: no JVD, carotid bruits, or masses Cardiac: RRR with tachycardia; no murmurs, rubs, or gallops,no edema  Respiratory:  clear to auscultation bilaterally, normal work of breathing GI: soft, nontender, nondistended, + BS MS: no deformity or atrophy  Skin: warm and dry, no rash Neuro:  Strength and sensation are intact Psych: euthymic mood, full affect   EKG:  EKG is ordered today. The ekg ordered today demonstrates atypical atrial flutter versus atrial tachycardia   Recent Labs: 01/12/2021: ALT 15; BUN 20; Creatinine, Ser 0.80; Hemoglobin 11.7; Platelets 81; Potassium 4.3; Sodium 134    Lipid Panel    Component Value Date/Time   CHOL 160 11/30/2020 0944   TRIG 156 (H) 11/30/2020 0944   HDL 26 (L) 11/30/2020 0944   CHOLHDL 6.2 (H) 11/30/2020 0944   LDLCALC 106 (H) 11/30/2020 0944      Wt Readings from Last 3 Encounters:  03/08/21 159 lb 2 oz  (72.2 kg)  02/07/21 164 lb 3.2 oz (74.5 kg)  01/16/21 163 lb (73.9 kg)      No flowsheet data found.  ASSESSMENT AND PLAN:  1.  Paroxysmal atrial fibrillation: The patient reports persistent tachycardia over the last few weeks.  She is noted to be an atypical atrial flutter versus atrial tachycardia today.  Difficult management options overall given that she normally has baseline bradycardia when she is in sinus rhythm.  In addition, she does not tolerate anticoagulation and thus cannot proceed with cardioversion. The presence of underlying liver cirrhosis also makes it hard to use antiarrhythmic medications.  For now, I elected to add flecainide 50 mg twice daily.  I suspect that the dose cannot be increased given underlying liver impairment but we will see if she would respond to this. The patient might require a permanent pacemaker placement in order to be able to use higher doses of beta-blocker.  I will have her see Dr. Quentin Ore again.  2.  Chronic diastolic heart failure: In spite of tachycardia, she appears to be euvolemic.  3.  Liver cirrhosis with esophageal varices: Followed by gastroenterology.  4.  Hyperlipidemia: Currently on simvastatin 20 mg daily.    Disposition:   Start flecainide 50 mg twice daily and follow-up with Dr. Quentin Ore for his opinion.  Signed,  Kathlyn Sacramento, MD  03/09/2021 1:09 PM    [place]

## 2021-03-08 NOTE — Patient Instructions (Signed)
Medication Instructions:  Your physician has recommended you make the following change in your medication:   START Flecainide 50 mg twice a day. An Rx has been sent to your pharmacy.   *If you need a refill on your cardiac medications before your next appointment, please call your pharmacy*   Lab Work: None ordered If you have labs (blood work) drawn today and your tests are completely normal, you will receive your results only by: [place] (if you have MyChart) OR A paper copy in the mail If you have any lab test that is abnormal or we need to change your treatment, we will call you to review the results.   Testing/Procedures: None ordered   Follow-Up: At [facility], you and your health needs are our priority.  As part of our continuing mission to provide you with exceptional heart care, we have created designated Provider Care Teams.  These Care Teams include your primary Cardiologist (physician) and Advanced Practice Providers (APPs -  Physician Assistants and Nurse Practitioners) who all work together to provide you with the care you need, when you need it.  We recommend signing up for the patient portal called "MyChart".  Sign up information is provided on this After Visit Summary.  MyChart is used to connect with patients for Virtual Visits (Telemedicine).  Patients are able to view lab/test results, encounter notes, upcoming appointments, etc.  Non-urgent messages can be sent to your provider as well.   To learn more about what you can do with MyChart, go to [email or web address].    Your next appointment:   Asap first available  The format for your next appointment:   In Person  Provider:   Lars Mage, MD    Other Instructions N/A

## 2021-03-13 NOTE — Progress Notes (Signed)
Electrophysiology Office Follow up Visit Note:    Date:  03/14/2021   ID:  Annette Foster, DOB 03/22/1940, MRN [id number]  PCP:  Albina Billet, MD  [facility] Cardiologist:  Nelva Bush, MD  [facility] Electrophysiologist:  Vickie Epley, MD    Interval History:    Annette Foster is a 81 y.o. female who presents for a follow up visit. They were last seen in clinic October 25, 2020 for an evaluation of her atrial fibrillation.  We specifically discussed the possibility of watchman implant.  She begin the work-up with CT scan showed a very shallow left atrial appendage not suitable for watchman implant.  She has since been seen by Dr. Fletcher Anon for highly symptomatic persistent atrial fibrillation with rapid ventricular rates.  When she is in normal rhythm in the past she is bradycardic which precludes safe use of significant doses of AV nodal blockers.  She is referred back to me to discuss possibility of AV junction ablation plus permanent pacemaker implant.       Past Medical History:  Diagnosis Date   Abdominal pain    Allergy    Cancer ([place]) 2017   breast cancer- Left   Cataract    CHF (congestive heart failure) (HCC)    Collagenous colitis    Diabetes mellitus without complication (HCC)    Diarrhea    Diverticulosis    Fatty liver disease, nonalcoholic    Fibrocystic breast    GERD (gastroesophageal reflux disease)    Heart murmur    Hyperlipidemia    Hypertension    Hypothyroidism    IBS (irritable bowel syndrome)    IDA (iron deficiency anemia)    Personal history of radiation therapy 2017   LEFT BREAST CA   PONV (postoperative nausea and vomiting)    Sleep apnea    C-Pap    Past Surgical History:  Procedure Laterality Date   ABDOMINAL HYSTERECTOMY     [place] bso   ABDOMINAL SURGERY     APPENDECTOMY     BREAST BIOPSY Bilateral 2016   negative   BREAST 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/2015   DCIS, papillary carcinoma in situ   BREAST BIOPSY Left  05/27/2016   BENIGN MAMMARY EPITHELIUM   BREAST BIOPSY Left 11/20/2017   affirm bx x clip BENIGN MAMMARY EPITHELIUM CONSISTENT WITH RAD THERAPY   BREAST EXCISIONAL BIOPSY Right    NEG 1980's   BREAST LUMPECTOMY Left 10/17/2015   DCIS and papillary carcinoma insitu, clear margins   CARDIAC SURGERY     has replacement valve   CATARACT EXTRACTION Right    CATARACT EXTRACTION W/PHACO Left 01/16/2021   Procedure: CATARACT EXTRACTION PHACO AND INTRAOCULAR LENS PLACEMENT (IOC) LEFT DIABETIC 8.46 00:59.8;  Surgeon: Birder Robson, MD;  Location: [place];  Service: Ophthalmology;  Laterality: Left;   CHOLECYSTECTOMY     COLONOSCOPY WITH PROPOFOL N/A 03/11/2016   Procedure: COLONOSCOPY WITH PROPOFOL;  Surgeon: Manya Silvas, MD;  Location[facility] ENDOSCOPY;  Service: Endoscopy;  Laterality: N/A;   COLONOSCOPY WITH PROPOFOL N/A 02/18/2020   Procedure: COLONOSCOPY WITH PROPOFOL;  Surgeon: [name], MD;  Location: [facility] ENDOSCOPY;  Service: Endoscopy;  Laterality: N/A;   ESOPHAGOGASTRODUODENOSCOPY (EGD) WITH PROPOFOL N/A 03/11/2016   Procedure: ESOPHAGOGASTRODUODENOSCOPY (EGD) WITH PROPOFOL;  Surgeon: Manya Silvas, MD;  Location: [facility] ENDOSCOPY;  Service: Endoscopy;  Laterality: N/A;   ESOPHAGOGASTRODUODENOSCOPY (EGD) WITH PROPOFOL N/A 02/18/2020   Procedure: ESOPHAGOGASTRODUODENOSCOPY (EGD) WITH PROPOFOL;  Surgeon: [name], MD;  Location: [facility]  ENDOSCOPY;  Service: Endoscopy;  Laterality: N/A;   ESOPHAGOGASTRODUODENOSCOPY (EGD) WITH PROPOFOL N/A 04/25/2020   Procedure: ESOPHAGOGASTRODUODENOSCOPY (EGD) WITH PROPOFOL;  Surgeon: [name], MD;  Location: [facility] ENDOSCOPY;  Service: Endoscopy;  Laterality: N/A;   ESOPHAGOGASTRODUODENOSCOPY (EGD) WITH PROPOFOL N/A 05/23/2020   Procedure: ESOPHAGOGASTRODUODENOSCOPY (EGD) WITH PROPOFOL;  Surgeon: [name], MD;  Location: [facility] ENDOSCOPY;  Service: Endoscopy;  Laterality: N/A;   FINGER ARTHROSCOPY WITH CARPOMETACARPEL ([place])  ARTHROPLASTY Right 09/03/2018   Procedure: CARPOMETACARPEL [facility]) ARTHROPLASTY RIGHT THUMB;  Surgeon: Hessie Knows, MD;  Location: [facility] ORS;  Service: Orthopedics;  Laterality: Right;   GANGLION CYST EXCISION Right 09/03/2018   Procedure: REMOVAL GANGLION OF WRIST;  Surgeon: Hessie Knows, MD;  Location: [facility] ORS;  Service: Orthopedics;  Laterality: Right;   HARDWARE REMOVAL Right 09/03/2018   Procedure: HARDWARE REMOVAL RIGHT THUMB;  Surgeon: Hessie Knows, MD;  Location: [facility] ORS;  Service: Orthopedics;  Laterality: Right;  staple removed   JOINT REPLACEMENT Left    TKR   left sinusplasty      MASTECTOMY, PARTIAL Left 10/17/2015   Procedure: MASTECTOMY PARTIAL REVISION;  Surgeon: Leonie Green, MD;  Location: [facility] ORS;  Service: General;  Laterality: Left;   PARTIAL MASTECTOMY WITH NEEDLE LOCALIZATION Left 09/29/2015   Procedure: PARTIAL MASTECTOMY WITH NEEDLE LOCALIZATION;  Surgeon: Leonie Green, MD;  Location: [facility] ORS;  Service: General;  Laterality: Left;   TOTAL ABDOMINAL HYSTERECTOMY W/ BILATERAL SALPINGOOPHORECTOMY      Current Medications: Current Meds  Medication Sig   benzonatate (TESSALON) 200 MG capsule Take 200 mg by mouth 3 (three) times daily as needed for cough.   Cholecalciferol (VITAMIN D) 50 MCG (2000 UT) CAPS Take 2,000 Units by mouth daily.   clobetasol ointment (TEMOVATE) 1.61 % 1 application two days per week PRN   clotrimazole (LOTRIMIN) 1 % cream Apply 1 application topically 2 (two) times daily. Apply to irritated vulvar area twice daily.   cyanocobalamin (,VITAMIN B-12,) 1000 MCG/ML injection 1,000 mcg every 30 (thirty) days.   dicyclomine (BENTYL) 10 MG capsule Take 10 mg by mouth at bedtime.    docusate sodium (COLACE) 100 MG capsule Take 1 capsule by mouth 2 (two) times daily as needed.   EPINEPHrine 0.3 mg/0.3 mL IJ SOAJ injection Inject 0.3 mg into the muscle as needed.   esomeprazole (NEXIUM) 40 MG capsule Take 40 mg by mouth 2 (two) times  daily before a meal.    estradiol (ESTRACE) 0.1 MG/GM vaginal cream Place 1 Applicatorful vaginally as needed.   fenofibrate 160 MG tablet Take 160 mg by mouth at bedtime.   flecainide (TAMBOCOR) 50 MG tablet Take 1 tablet (50 mg total) by mouth 2 (two) times daily.   gabapentin (NEURONTIN) 300 MG capsule Take 300 mg by mouth at bedtime.    hydrOXYzine (ATARAX/VISTARIL) 25 MG tablet 1 tablet as needed   Insulin Degludec (TRESIBA) 100 UNIT/ML SOLN Inject 120 Units into the skin at bedtime.   levothyroxine (SYNTHROID, LEVOTHROID) 75 MCG tablet Take 75 mcg by mouth daily before breakfast.    magnesium oxide (MAG-OX) 400 MG tablet Take 400 mg by mouth 2 (two) times daily.    nadolol (CORGARD) 20 MG tablet TAKE 1/2 TABLET BY MOUTH DAILY   NOVOLOG FLEXPEN 100 UNIT/ML FlexPen Inject into the skin 3 (three) times daily with meals. Based on sliding scale   potassium chloride SA (K-DUR,KLOR-CON) 20 MEQ tablet Take 20 mEq by mouth 2 (two) times daily.    saccharomyces boulardii (FLORASTOR) 250 MG capsule  Take 250 mg by mouth 2 (two) times daily.    simvastatin (ZOCOR) 20 MG tablet Take 20 mg by mouth daily.   spironolactone (ALDACTONE) 25 MG tablet TAKE 1 TABLET (25 MG TOTAL) BY MOUTH DAILY.   Tirzepatide [facility]) Inject into the skin once a week.   torsemide (DEMADEX) 20 MG tablet TAKE 1 TABLET BY MOUTH EVERY DAY   valACYclovir (VALTREX) 500 MG tablet Take 500 mg by mouth See admin instructions. For fever blisters take 500mg  twice a day as needed     Allergies:   Codeine, Demeclocycline, Demerol [meperidine], Hydrocodone, Oxycodone, Pentazocine, Tetracyclines & related, and Fentanyl   Social History   Socioeconomic History   Marital status: Married    Spouse name: Not on file   Number of children: Not on file   Years of education: Not on file   Highest education level: Not on file  Occupational History   Not on file  Tobacco Use   Smoking status: Never   Smokeless tobacco: Never  Vaping  Use   Vaping Use: Never used  Substance and Sexual Activity   Alcohol use: No    Alcohol/week: 0.0 standard drinks   Drug use: No   Sexual activity: Not Currently    Birth control/protection: Surgical  Other Topics Concern   Not on file  Social History Narrative   Not on file   Social Determinants of Health   Financial Resource Strain: Not on file  Food Insecurity: Not on file  Transportation Needs: Not on file  Physical Activity: Not on file  Stress: Not on file  Social Connections: Not on file     Family History: The patient's family history includes Bladder Cancer in her brother; Breast cancer in her paternal grandmother; Colon cancer in her father and maternal uncle; Diabetes in her brother and sister; Heart disease in her brother; Leukemia in her mother; Prostate cancer in her brother and brother. There is no history of Ovarian cancer or Kidney cancer.  ROS:   Please see the history of present illness.    All other systems reviewed and are negative.  EKGs/Labs/Other Studies Reviewed:    The following studies were reviewed today:  February 08, 2020 echo Left ventricular function normal, 50 to 55% Right ventricular function mildly reduced Moderately elevated PASP Moderately dilated left atrium Mildly dilated right atrium Trivial MR Mild to moderate TR     Recent Labs: 01/12/2021: ALT 15; BUN 20; Creatinine, Ser 0.80; Hemoglobin 11.7; Platelets 81; Potassium 4.3; Sodium 134  Recent Lipid Panel    Component Value Date/Time   CHOL 160 11/30/2020 0944   TRIG 156 (H) 11/30/2020 0944   HDL 26 (L) 11/30/2020 0944   CHOLHDL 6.2 (H) 11/30/2020 0944   LDLCALC 106 (H) 11/30/2020 0944    Physical Exam:    VS:  BP 110/60 (BP Location: Left Arm, Patient Position: Sitting, Cuff Size: Normal)    Pulse 100    Ht 5\' 3"  (1.6 m)    Wt 160 lb (72.6 kg)    LMP  (LMP Unknown)    SpO2 98%    BMI 28.34 kg/m     Wt Readings from Last 3 Encounters:  03/14/21 160 lb (72.6 kg)   03/08/21 159 lb 2 oz (72.2 kg)  02/07/21 164 lb 3.2 oz (74.5 kg)     GEN:  Well nourished, well developed in no acute distress HEENT: Normal NECK: No JVD; No carotid bruits LYMPHATICS: No lymphadenopathy CARDIAC: Tachycardic, no murmurs, rubs,  gallops RESPIRATORY:  Clear to auscultation without rales, wheezing or rhonchi  ABDOMEN: Soft, non-tender, non-distended MUSCULOSKELETAL:  No edema; No deformity  SKIN: Warm and dry NEUROLOGIC:  Alert and oriented x 3 PSYCHIATRIC:  Normal affect        ASSESSMENT:    1. Chronic heart failure with preserved ejection fraction (HFpEF) ([place])   2. Esophageal varices without bleeding, unspecified esophageal varices type ([place])   3. Hepatic cirrhosis, unspecified hepatic cirrhosis type, unspecified whether ascites present ([place])   4. Persistent atrial fibrillation (HCC)    PLAN:    In order of problems listed above:  #Longstanding persistent atrial fibrillation flutter Highly symptomatic.  She is currently locked into an atrial flutter that is atypical.  Rhythm control is made extremely difficult because of her intolerance of anticoagulation.  She is not a candidate even for short-term anticoagulation.  I discussed the options available to her including permanent pacemaker implant followed by AV junction ablation and she wishes to proceed.  Given her history of left-sided breast cancer, we will plan for a right-sided dual-chamber permanent pacemaker.  We will attempt placement of left bundle area lead.  I discussed the procedure in detail include the risks and she wishes to proceed.  After permanent pacemaker implant, we will wait 8 weeks and then perform AV junction ablation.  Risks, benefits, alternatives to PPM implantation were discussed in detail with the patient today. The patient understands that the risks include but are not limited to bleeding, infection, pneumothorax, perforation, tamponade, vascular damage, renal failure, MI, stroke,  death, and lead dislodgement and wishes to proceed.  We will therefore schedule device implantation at the next available time.  Therapeutic strategies for AF were discussed in detail with the patient today. Risk, benefits, and alternatives to AVJ ablation were also discussed in detail today. These risks include but are not limited to stroke, bleeding, vascular damage, tamponade, perforation, damage to the heart and other structures, worsening renal function, and death. The patient understands these risk and wishes to proceed.  We will therefore proceed with catheter ablation at the next available time.  #Chronic diastolic heart failure NYHA class II today.  Warm and relatively euvolemic.  Plan for AV junction ablation and pacemaker implant as above.  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      Total time spent with patient today 50 minutes. This includes reviewing records, evaluating the patient and coordinating care.   Medication Adjustments/Labs and Tests Ordered: Current medicines are reviewed at length with the patient today.  Concerns regarding medicines are outlined above.  No orders of the defined types were placed in this encounter.  No orders of the defined types were placed in this encounter.    Signed, Lars Mage, MD, [facility], [facility] 03/14/2021 10:42 AM    Electrophysiology  [facility]

## 2021-03-13 NOTE — H&P (View-Only) (Signed)
Electrophysiology Office Follow up Visit Note:    Date:  03/14/2021   ID:  Annette Foster, DOB July 13, 1940, MRN [id number]  PCP:  Albina Billet, MD  [facility] Cardiologist:  Nelva Bush, MD  [facility] Electrophysiologist:  Vickie Epley, MD    Interval History:    Annette Foster is a 81 y.o. female who presents for a follow up visit. They were last seen in clinic October 25, 2020 for an evaluation of her atrial fibrillation.  We specifically discussed the possibility of watchman implant.  She begin the work-up with CT scan showed a very shallow left atrial appendage not suitable for watchman implant.  She has since been seen by Dr. Fletcher Anon for highly symptomatic persistent atrial fibrillation with rapid ventricular rates.  When she is in normal rhythm in the past she is bradycardic which precludes safe use of significant doses of AV nodal blockers.  She is referred back to me to discuss possibility of AV junction ablation plus permanent pacemaker implant.       Past Medical History:  Diagnosis Date   Abdominal pain    Allergy    Cancer ([place]) 2017   breast cancer- Left   Cataract    CHF (congestive heart failure) (HCC)    Collagenous colitis    Diabetes mellitus without complication (HCC)    Diarrhea    Diverticulosis    Fatty liver disease, nonalcoholic    Fibrocystic breast    GERD (gastroesophageal reflux disease)    Heart murmur    Hyperlipidemia    Hypertension    Hypothyroidism    IBS (irritable bowel syndrome)    IDA (iron deficiency anemia)    Personal history of radiation therapy 2017   LEFT BREAST CA   PONV (postoperative nausea and vomiting)    Sleep apnea    C-Pap    Past Surgical History:  Procedure Laterality Date   ABDOMINAL HYSTERECTOMY     [place] bso   ABDOMINAL SURGERY     APPENDECTOMY     BREAST BIOPSY Bilateral 2016   negative   BREAST 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/2015   DCIS, papillary carcinoma in situ   BREAST BIOPSY Left  05/27/2016   BENIGN MAMMARY EPITHELIUM   BREAST BIOPSY Left 11/20/2017   affirm bx x clip BENIGN MAMMARY EPITHELIUM CONSISTENT WITH RAD THERAPY   BREAST EXCISIONAL BIOPSY Right    NEG 1980's   BREAST LUMPECTOMY Left 10/17/2015   DCIS and papillary carcinoma insitu, clear margins   CARDIAC SURGERY     has replacement valve   CATARACT EXTRACTION Right    CATARACT EXTRACTION W/PHACO Left 01/16/2021   Procedure: CATARACT EXTRACTION PHACO AND INTRAOCULAR LENS PLACEMENT (IOC) LEFT DIABETIC 8.46 00:59.8;  Surgeon: Birder Robson, MD;  Location: [place];  Service: Ophthalmology;  Laterality: Left;   CHOLECYSTECTOMY     COLONOSCOPY WITH PROPOFOL N/A 03/11/2016   Procedure: COLONOSCOPY WITH PROPOFOL;  Surgeon: Manya Silvas, MD;  Location: [facility] ENDOSCOPY;  Service: Endoscopy;  Laterality: N/A;   COLONOSCOPY WITH PROPOFOL N/A 02/18/2020   Procedure: COLONOSCOPY WITH PROPOFOL;  Surgeon: [name], MD;  Location: [facility] ENDOSCOPY;  Service: Endoscopy;  Laterality: N/A;   ESOPHAGOGASTRODUODENOSCOPY (EGD) WITH PROPOFOL N/A 03/11/2016   Procedure: ESOPHAGOGASTRODUODENOSCOPY (EGD) WITH PROPOFOL;  Surgeon: Manya Silvas, MD;  Location: [facility] ENDOSCOPY;  Service: Endoscopy;  Laterality: N/A;   ESOPHAGOGASTRODUODENOSCOPY (EGD) WITH PROPOFOL N/A 02/18/2020   Procedure: ESOPHAGOGASTRODUODENOSCOPY (EGD) WITH PROPOFOL;  Surgeon: [name], MD;  Location: [facility]  ENDOSCOPY;  Service: Endoscopy;  Laterality: N/A;   ESOPHAGOGASTRODUODENOSCOPY (EGD) WITH PROPOFOL N/A 04/25/2020   Procedure: ESOPHAGOGASTRODUODENOSCOPY (EGD) WITH PROPOFOL;  Surgeon: [name], MD;  Location: [facility] ENDOSCOPY;  Service: Endoscopy;  Laterality: N/A;   ESOPHAGOGASTRODUODENOSCOPY (EGD) WITH PROPOFOL N/A 05/23/2020   Procedure: ESOPHAGOGASTRODUODENOSCOPY (EGD) WITH PROPOFOL;  Surgeon: [name], MD;  Location: [facility] ENDOSCOPY;  Service: Endoscopy;  Laterality: N/A;   FINGER ARTHROSCOPY WITH CARPOMETACARPEL ([place])  ARTHROPLASTY Right 09/03/2018   Procedure: CARPOMETACARPEL [facility]) ARTHROPLASTY RIGHT THUMB;  Surgeon: Hessie Knows, MD;  Location: [facility] ORS;  Service: Orthopedics;  Laterality: Right;   GANGLION CYST EXCISION Right 09/03/2018   Procedure: REMOVAL GANGLION OF WRIST;  Surgeon: Hessie Knows, MD;  Location: [facility] ORS;  Service: Orthopedics;  Laterality: Right;   HARDWARE REMOVAL Right 09/03/2018   Procedure: HARDWARE REMOVAL RIGHT THUMB;  Surgeon: Hessie Knows, MD;  Location: [facility] ORS;  Service: Orthopedics;  Laterality: Right;  staple removed   JOINT REPLACEMENT Left    TKR   left sinusplasty      MASTECTOMY, PARTIAL Left 10/17/2015   Procedure: MASTECTOMY PARTIAL REVISION;  Surgeon: Leonie Green, MD;  Location: [facility] ORS;  Service: General;  Laterality: Left;   PARTIAL MASTECTOMY WITH NEEDLE LOCALIZATION Left 09/29/2015   Procedure: PARTIAL MASTECTOMY WITH NEEDLE LOCALIZATION;  Surgeon: Leonie Green, MD;  Location: [facility] ORS;  Service: General;  Laterality: Left;   TOTAL ABDOMINAL HYSTERECTOMY W/ BILATERAL SALPINGOOPHORECTOMY      Current Medications: Current Meds  Medication Sig   benzonatate (TESSALON) 200 MG capsule Take 200 mg by mouth 3 (three) times daily as needed for cough.   Cholecalciferol (VITAMIN D) 50 MCG (2000 UT) CAPS Take 2,000 Units by mouth daily.   clobetasol ointment (TEMOVATE) 7.82 % 1 application two days per week PRN   clotrimazole (LOTRIMIN) 1 % cream Apply 1 application topically 2 (two) times daily. Apply to irritated vulvar area twice daily.   cyanocobalamin (,VITAMIN B-12,) 1000 MCG/ML injection 1,000 mcg every 30 (thirty) days.   dicyclomine (BENTYL) 10 MG capsule Take 10 mg by mouth at bedtime.    docusate sodium (COLACE) 100 MG capsule Take 1 capsule by mouth 2 (two) times daily as needed.   EPINEPHrine 0.3 mg/0.3 mL IJ SOAJ injection Inject 0.3 mg into the muscle as needed.   esomeprazole (NEXIUM) 40 MG capsule Take 40 mg by mouth 2 (two) times  daily before a meal.    estradiol (ESTRACE) 0.1 MG/GM vaginal cream Place 1 Applicatorful vaginally as needed.   fenofibrate 160 MG tablet Take 160 mg by mouth at bedtime.   flecainide (TAMBOCOR) 50 MG tablet Take 1 tablet (50 mg total) by mouth 2 (two) times daily.   gabapentin (NEURONTIN) 300 MG capsule Take 300 mg by mouth at bedtime.    hydrOXYzine (ATARAX/VISTARIL) 25 MG tablet 1 tablet as needed   Insulin Degludec (TRESIBA) 100 UNIT/ML SOLN Inject 120 Units into the skin at bedtime.   levothyroxine (SYNTHROID, LEVOTHROID) 75 MCG tablet Take 75 mcg by mouth daily before breakfast.    magnesium oxide (MAG-OX) 400 MG tablet Take 400 mg by mouth 2 (two) times daily.    nadolol (CORGARD) 20 MG tablet TAKE 1/2 TABLET BY MOUTH DAILY   NOVOLOG FLEXPEN 100 UNIT/ML FlexPen Inject into the skin 3 (three) times daily with meals. Based on sliding scale   potassium chloride SA (K-DUR,KLOR-CON) 20 MEQ tablet Take 20 mEq by mouth 2 (two) times daily.    saccharomyces boulardii (FLORASTOR) 250 MG capsule  Take 250 mg by mouth 2 (two) times daily.    simvastatin (ZOCOR) 20 MG tablet Take 20 mg by mouth daily.   spironolactone (ALDACTONE) 25 MG tablet TAKE 1 TABLET (25 MG TOTAL) BY MOUTH DAILY.   Tirzepatide [facility]) Inject into the skin once a week.   torsemide (DEMADEX) 20 MG tablet TAKE 1 TABLET BY MOUTH EVERY DAY   valACYclovir (VALTREX) 500 MG tablet Take 500 mg by mouth See admin instructions. For fever blisters take 500mg  twice a day as needed     Allergies:   Codeine, Demeclocycline, Demerol [meperidine], Hydrocodone, Oxycodone, Pentazocine, Tetracyclines & related, and Fentanyl   Social History   Socioeconomic History   Marital status: Married    Spouse name: Not on file   Number of children: Not on file   Years of education: Not on file   Highest education level: Not on file  Occupational History   Not on file  Tobacco Use   Smoking status: Never   Smokeless tobacco: Never  Vaping  Use   Vaping Use: Never used  Substance and Sexual Activity   Alcohol use: No    Alcohol/week: 0.0 standard drinks   Drug use: No   Sexual activity: Not Currently    Birth control/protection: Surgical  Other Topics Concern   Not on file  Social History Narrative   Not on file   Social Determinants of Health   Financial Resource Strain: Not on file  Food Insecurity: Not on file  Transportation Needs: Not on file  Physical Activity: Not on file  Stress: Not on file  Social Connections: Not on file     Family History: The patient's family history includes Bladder Cancer in her brother; Breast cancer in her paternal grandmother; Colon cancer in her father and maternal uncle; Diabetes in her brother and sister; Heart disease in her brother; Leukemia in her mother; Prostate cancer in her brother and brother. There is no history of Ovarian cancer or Kidney cancer.  ROS:   Please see the history of present illness.    All other systems reviewed and are negative.  EKGs/Labs/Other Studies Reviewed:    The following studies were reviewed today:  February 08, 2020 echo Left ventricular function normal, 50 to 55% Right ventricular function mildly reduced Moderately elevated PASP Moderately dilated left atrium Mildly dilated right atrium Trivial MR Mild to moderate TR     Recent Labs: 01/12/2021: ALT 15; BUN 20; Creatinine, Ser 0.80; Hemoglobin 11.7; Platelets 81; Potassium 4.3; Sodium 134  Recent Lipid Panel    Component Value Date/Time   CHOL 160 11/30/2020 0944   TRIG 156 (H) 11/30/2020 0944   HDL 26 (L) 11/30/2020 0944   CHOLHDL 6.2 (H) 11/30/2020 0944   LDLCALC 106 (H) 11/30/2020 0944    Physical Exam:    VS:  BP 110/60 (BP Location: Left Arm, Patient Position: Sitting, Cuff Size: Normal)    Pulse 100    Ht 5\' 3"  (1.6 m)    Wt 160 lb (72.6 kg)    LMP  (LMP Unknown)    SpO2 98%    BMI 28.34 kg/m     Wt Readings from Last 3 Encounters:  03/14/21 160 lb (72.6 kg)   03/08/21 159 lb 2 oz (72.2 kg)  02/07/21 164 lb 3.2 oz (74.5 kg)     GEN:  Well nourished, well developed in no acute distress HEENT: Normal NECK: No JVD; No carotid bruits LYMPHATICS: No lymphadenopathy CARDIAC: Tachycardic, no murmurs, rubs,  gallops RESPIRATORY:  Clear to auscultation without rales, wheezing or rhonchi  ABDOMEN: Soft, non-tender, non-distended MUSCULOSKELETAL:  No edema; No deformity  SKIN: Warm and dry NEUROLOGIC:  Alert and oriented x 3 PSYCHIATRIC:  Normal affect        ASSESSMENT:    1. Chronic heart failure with preserved ejection fraction (HFpEF) ([place])   2. Esophageal varices without bleeding, unspecified esophageal varices type ([place])   3. Hepatic cirrhosis, unspecified hepatic cirrhosis type, unspecified whether ascites present ([place])   4. Persistent atrial fibrillation (HCC)    PLAN:    In order of problems listed above:  #Longstanding persistent atrial fibrillation flutter Highly symptomatic.  She is currently locked into an atrial flutter that is atypical.  Rhythm control is made extremely difficult because of her intolerance of anticoagulation.  She is not a candidate even for short-term anticoagulation.  I discussed the options available to her including permanent pacemaker implant followed by AV junction ablation and she wishes to proceed.  Given her history of left-sided breast cancer, we will plan for a right-sided dual-chamber permanent pacemaker.  We will attempt placement of left bundle area lead.  I discussed the procedure in detail include the risks and she wishes to proceed.  After permanent pacemaker implant, we will wait 8 weeks and then perform AV junction ablation.  Risks, benefits, alternatives to PPM implantation were discussed in detail with the patient today. The patient understands that the risks include but are not limited to bleeding, infection, pneumothorax, perforation, tamponade, vascular damage, renal failure, MI, stroke,  death, and lead dislodgement and wishes to proceed.  We will therefore schedule device implantation at the next available time.  Therapeutic strategies for AF were discussed in detail with the patient today. Risk, benefits, and alternatives to AVJ ablation were also discussed in detail today. These risks include but are not limited to stroke, bleeding, vascular damage, tamponade, perforation, damage to the heart and other structures, worsening renal function, and death. The patient understands these risk and wishes to proceed.  We will therefore proceed with catheter ablation at the next available time.  #Chronic diastolic heart failure NYHA class II today.  Warm and relatively euvolemic.  Plan for AV junction ablation and pacemaker implant as above.  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      Total time spent with patient today 50 minutes. This includes reviewing records, evaluating the patient and coordinating care.   Medication Adjustments/Labs and Tests Ordered: Current medicines are reviewed at length with the patient today.  Concerns regarding medicines are outlined above.  No orders of the defined types were placed in this encounter.  No orders of the defined types were placed in this encounter.    Signed, Lars Mage, MD, [facility], [facility] 03/14/2021 10:42 AM    Electrophysiology [facility]

## 2021-03-14 ENCOUNTER — Other Ambulatory Visit: Payer: Self-pay

## 2021-03-14 ENCOUNTER — Encounter: Payer: Self-pay | Admitting: Cardiology

## 2021-03-14 ENCOUNTER — Ambulatory Visit (INDEPENDENT_AMBULATORY_CARE_PROVIDER_SITE_OTHER): Payer: Medicare PPO | Admitting: Cardiology

## 2021-03-14 ENCOUNTER — Encounter: Payer: Self-pay | Admitting: *Deleted

## 2021-03-14 VITALS — BP 110/60 | HR 100 | Ht 63.0 in | Wt 160.0 lb

## 2021-03-14 DIAGNOSIS — Z01818 Encounter for other preprocedural examination: Secondary | ICD-10-CM

## 2021-03-14 DIAGNOSIS — I5032 Chronic diastolic (congestive) heart failure: Secondary | ICD-10-CM

## 2021-03-14 DIAGNOSIS — K7460 Unspecified cirrhosis of liver: Secondary | ICD-10-CM

## 2021-03-14 DIAGNOSIS — I8500 Esophageal varices without bleeding: Secondary | ICD-10-CM

## 2021-03-14 DIAGNOSIS — I4819 Other persistent atrial fibrillation: Secondary | ICD-10-CM

## 2021-03-14 NOTE — Patient Instructions (Addendum)
Medications: Stop Flecainide  Your physician recommends that you continue on your current medications as directed. Please refer to the Current Medication list given to you today. *If you need a refill on your cardiac medications before your next appointment, please call your pharmacy*  Lab Work: CBC, BMP If you have labs (blood work) drawn today and your tests are completely normal, you will receive your results only by: [place] (if you have MyChart) OR A paper copy in the mail If you have any lab test that is abnormal or we need to change your treatment, we will call you to review the results.  Testing/Procedures: Your physician has requested that you have an echocardiogram. Echocardiography is a painless test that uses sound waves to create images of your heart. It provides your doctor with information about the size and shape of your heart and how well your hearts chambers and valves are working. This procedure takes approximately one hour. There are no restrictions for this procedure.  Your physician has recommended that you have a pacemaker inserted. A pacemaker is a small device that is placed under the skin of your chest or abdomen to help control abnormal heart rhythms. This device uses electrical pulses to prompt the heart to beat at a normal rate. Pacemakers are used to treat heart rhythms that are too slow. Wire (leads) are attached to the pacemaker that goes into the chambers of you heart. This is done in the hospital and usually requires and overnight stay. Please see the instruction sheet given to you today for more information.  Your physician has recommended that you have an ablation. Catheter ablation is a medical procedure used to treat some cardiac arrhythmias (irregular heartbeats). During catheter ablation, a long, thin, flexible tube is put into a blood vessel in your groin (upper thigh), or neck. This tube is called an ablation catheter. It is then guided to your  heart through the blood vessel. Radio frequency waves destroy small areas of heart tissue where abnormal heartbeats may cause an arrhythmia to start. Please see the instruction sheet given to you today.   Follow-Up: At [facility] you and your health needs are our priority.  As part of our continuing mission to provide you with exceptional heart care, we have created designated Provider Care Teams.  These Care Teams include your primary Cardiologist (physician) and Advanced Practice Providers (APPs -  Physician Assistants and Nurse Practitioners) who all work together to provide you with the care you need, when you need it.  Your physician wants you to follow-up in: See instruction letters.   We recommend signing up for the patient portal called "MyChart".  Sign up information is provided on this After Visit Summary.  MyChart is used to connect with patients for Virtual Visits (Telemedicine).  Patients are able to view lab/test results, encounter notes, upcoming appointments, etc.  Non-urgent messages can be sent to your provider as well.   To learn more about what you can do with MyChart, go to [email or web address].    Any Other Special Instructions Will Be Listed Below (If Applicable).  Pacemaker Implantation, Adult Pacemaker implantation is a procedure to place a pacemaker inside the chest. A pacemaker is a small computer that sends electrical signals to the heart and helps the heart beat normally. A pacemaker also stores information about heart rhythms. You may need pacemaker implantation if you have: A slow heartbeat (bradycardia). Loss of consciousness that happens repeatedly (syncope) or repeated episodes of dizziness or light-headedness because  of an irregular heart rate. Shortness of breath (dyspnea) due to heart problems. The pacemaker usually attaches to your heart through a wire called a lead. One or two leads may be needed. There are different types of pacemakers: Transvenous  pacemaker. This type is placed under the skin or muscle of your upper chest area. The lead goes through a vein in the chest area to reach the inside of the heart. Epicardial pacemaker. This type is placed under the skin or muscle of your chest or abdomen. The lead goes through your chest to the outside of the heart. Tell a health care provider about: Any allergies you have. All medicines you are taking, including vitamins, herbs, eye drops, creams, and over-the-counter medicines. Any problems you or family members have had with anesthetic medicines. Any blood or bone disorders you have. Any surgeries you have had. Any medical conditions you have. Whether you are pregnant or may be pregnant. What are the risks? Generally, this is a safe procedure. However, problems may occur, including: Infection. Bleeding. Failure of the pacemaker or the lead. Collapse of a lung or bleeding into a lung. Blood clot inside a blood vessel with a lead. Damage to the heart. Infection inside the heart (endocarditis). Allergic reactions to medicines. What happens before the procedure? Staying hydrated Follow instructions from your health care provider about hydration, which may include: Up to 2 hours before the procedure - you may continue to drink clear liquids, such as water, clear fruit juice, black coffee, and plain tea.  Eating and drinking restrictions Follow instructions from your health care provider about eating and drinking, which may include: 8 hours before the procedure - stop eating heavy meals or foods, such as meat, fried foods, or fatty foods. 6 hours before the procedure - stop eating light meals or foods, such as toast or cereal. 6 hours before the procedure - stop drinking milk or drinks that contain milk. 2 hours before the procedure - stop drinking clear liquids. Medicines Ask your health care provider about: Changing or stopping your regular medicines. This is especially important if  you are taking diabetes medicines or blood thinners. Taking medicines such as aspirin and ibuprofen. These medicines can thin your blood. Do not take these medicines unless your health care provider tells you to take them. Taking over-the-counter medicines, vitamins, herbs, and supplements. Tests You may have: A heart evaluation. This may include: An electrocardiogram (ECG). This involves placing patches on your skin to check your heart rhythm. A chest X-ray. An echocardiogram. This is a test that uses sound waves (ultrasound) to produce an image of the heart. A cardiac rhythm monitor. This is used to record your heart rhythm and any events for a longer period of time. Blood tests. Genetic testing. General instructions Do not use any products that contain nicotine or tobacco for at least 4 weeks before the procedure. These products include cigarettes, e-cigarettes, and chewing tobacco. If you need help quitting, ask your health care provider. Ask your health care provider: How your surgery site will be marked. What steps will be taken to help prevent infection. These steps may include: Removing hair at the surgery site. Washing skin with a germ-killing soap. Receiving antibiotic medicine. Plan to have someone take you home from the hospital or clinic. If you will be going home right after the procedure, plan to have someone with you for 24 hours. What happens during the procedure? An IV will be inserted into one of your veins. You will  be given one or more of the following: A medicine to help you relax (sedative). A medicine to numb the area (local anesthetic). A medicine to make you fall asleep (general anesthetic). The next steps vary depending on the type of pacemaker you will be getting. If you are getting a transvenous pacemaker: An incision will be made in your upper chest. A pocket will be made for the pacemaker. It may be placed under the skin or between layers of muscle. The  lead will be inserted into a blood vessel that goes to the heart. While X-rays are taken by an imaging machine (fluoroscopy), the lead will be advanced through the vein to the inside of your heart. The other end of the lead will be tunneled under the skin and attached to the pacemaker. If you are getting an epicardial pacemaker: An incision will be made near your ribs or breastbone (sternum) for the lead. The lead will be attached to the outside of your heart. Another incision will be made in your chest or upper abdomen to create a pocket for the pacemaker. The free end of the lead will be tunneled under the skin and attached to the pacemaker. The transvenous or epicardial pacemaker will be tested. Imaging studies may be done to check the lead position. The incisions will be closed with stitches (sutures), adhesive strips, or skin glue. Bandages (dressings) will be placed over the incisions. The procedure may vary among health care providers and hospitals. What happens after the procedure? Your blood pressure, heart rate, breathing rate, and blood oxygen level will be monitored until you leave the hospital or clinic. You may be given antibiotics. You will be given pain medicine. An ECG and chest X-rays will be done. You may need to wear a continuous type of ECG (Holter monitor) to check your heart rhythm. Your health care provider will program the pacemaker. If you were given a sedative during the procedure, it can affect you for several hours. Do not drive or operate machinery until your health care provider says that it is safe. You will be given a pacemaker identification card. This card lists the implant date, device model, and manufacturer of your pacemaker. Summary A pacemaker is a small computer that sends electrical signals to the heart and helps the heart beat normally. There are different types of pacemakers. A pacemaker may be placed under the skin or muscle of your chest or  abdomen. Follow instructions from your health care provider about eating and drinking and about taking medicines before the procedure. This information is not intended to replace advice given to you by your health care provider. Make sure you discuss any questions you have with your health care provider. Document Revised: 10/10/2020 Document Reviewed: 12/30/2018 Elsevier Patient Education  2022 [employer].   Cardiac Ablation Cardiac ablation is a procedure to destroy (ablate) some heart tissue that is sending bad signals. These bad signals cause problems in heart rhythm. The heart has many areas that make these signals. If there are problems in these areas, they can make the heart beat in a way that is not normal. Destroying some tissues can help make the heart rhythm normal. Tell your doctor about: Any allergies you have. All medicines you are taking. These include vitamins, herbs, eye drops, creams, and over-the-counter medicines. Any problems you or family members have had with medicines that make you fall asleep (anesthetics). Any blood disorders you have. Any surgeries you have had. Any medical conditions you have, such  as kidney failure. Whether you are pregnant or may be pregnant. What are the risks? This is a safe procedure. But problems may occur, including: Infection. Bruising and bleeding. Bleeding into the chest. Stroke or blood clots. Damage to nearby areas of your body. Allergies to medicines or dyes. The need for a pacemaker if the normal system is damaged. Failure of the procedure to treat the problem. What happens before the procedure? Medicines Ask your doctor about: Changing or stopping your normal medicines. This is important. Taking aspirin and ibuprofen. Do not take these medicines unless your doctor tells you to take them. Taking other medicines, vitamins, herbs, and supplements. General instructions Follow instructions from your doctor about what you  cannot eat or drink. Plan to have someone take you home from the hospital or clinic. If you will be going home right after the procedure, plan to have someone with you for 24 hours. Ask your doctor what steps will be taken to prevent infection. What happens during the procedure?  An IV tube will be put into one of your veins. You will be given a medicine to help you relax. The skin on your neck or groin will be numbed. A cut (incision) will be made in your neck or groin. A needle will be put through your cut and into a large vein. A tube (catheter) will be put into the needle. The tube will be moved to your heart. Dye may be put through the tube. This helps your doctor see your heart. Small devices (electrodes) on the tube will send out signals. A type of energy will be used to destroy some heart tissue. The tube will be taken out. Pressure will be held on your cut. This helps stop bleeding. A bandage will be put over your cut. The exact procedure may vary among doctors and hospitals. What happens after the procedure? You will be watched until you leave the hospital or clinic. This includes checking your heart rate, breathing rate, oxygen, and blood pressure. Your cut will be watched for bleeding. You will need to lie still for a few hours. Do not drive for 24 hours or as long as your doctor tells you. Summary Cardiac ablation is a procedure to destroy some heart tissue. This is done to treat heart rhythm problems. Tell your doctor about any medical conditions you may have. Tell him or her about all medicines you are taking to treat them. This is a safe procedure. But problems may occur. These include infection, bruising, bleeding, and damage to nearby areas of your body. Follow what your doctor tells you about food and drink. You may also be told to change or stop some of your medicines. After the procedure, do not drive for 24 hours or as long as your doctor tells you. This information  is not intended to replace advice given to you by your health care provider. Make sure you discuss any questions you have with your health care provider. Document Revised: 12/31/2018 Document Reviewed: 12/31/2018 Elsevier Patient Education  2022 [employer].

## 2021-03-15 ENCOUNTER — Telehealth: Payer: Self-pay | Admitting: *Deleted

## 2021-03-15 ENCOUNTER — Other Ambulatory Visit: Payer: Self-pay | Admitting: Cardiology

## 2021-03-15 DIAGNOSIS — I4589 Other specified conduction disorders: Secondary | ICD-10-CM

## 2021-03-15 DIAGNOSIS — Z01818 Encounter for other preprocedural examination: Secondary | ICD-10-CM

## 2021-03-15 DIAGNOSIS — I5032 Chronic diastolic (congestive) heart failure: Secondary | ICD-10-CM

## 2021-03-15 DIAGNOSIS — I4819 Other persistent atrial fibrillation: Secondary | ICD-10-CM

## 2021-03-15 LAB — BASIC METABOLIC PANEL
BUN/Creatinine Ratio: 15 (ref 12–28)
BUN: 13 mg/dL (ref 8–27)
CO2: 28 mmol/L (ref 20–29)
Calcium: 9.1 mg/dL (ref 8.7–10.3)
Chloride: 95 mmol/L — ABNORMAL LOW (ref 96–106)
Creatinine, Ser: 0.85 mg/dL (ref 0.57–1.00)
Glucose: 407 mg/dL — ABNORMAL HIGH (ref 70–99)
Potassium: 3.9 mmol/L (ref 3.5–5.2)
Sodium: 138 mmol/L (ref 134–144)
eGFR: 69 mL/min/{1.73_m2} (ref >59)

## 2021-03-15 LAB — CBC WITH DIFFERENTIAL/PLATELET
Basophils Absolute: 0 10*3/uL (ref 0.0–0.2)
Basos: 1 %
EOS (ABSOLUTE): 0.1 10*3/uL (ref 0.0–0.4)
Eos: 4 %
Hematocrit: 35.9 % (ref 34.0–46.6)
Hemoglobin: 11.8 g/dL (ref 11.1–15.9)
Immature Grans (Abs): 0 10*3/uL (ref 0.0–0.1)
Immature Granulocytes: 0 %
Lymphocytes Absolute: 0.6 10*3/uL — ABNORMAL LOW (ref 0.7–3.1)
Lymphs: 32 %
MCH: 29.9 pg (ref 26.6–33.0)
MCHC: 32.9 g/dL (ref 31.5–35.7)
MCV: 91 fL (ref 79–97)
Monocytes Absolute: 0.2 10*3/uL (ref 0.1–0.9)
Monocytes: 11 %
Neutrophils Absolute: 1 10*3/uL — ABNORMAL LOW (ref 1.4–7.0)
Neutrophils: 52 %
Platelets: 74 10*3/uL — CL (ref 150–450)
RBC: 3.95 x10E6/uL (ref 3.77–5.28)
RDW: 12.8 % (ref 11.7–15.4)
WBC: 1.9 10*3/uL — CL (ref 3.4–10.8)

## 2021-03-15 NOTE — Telephone Encounter (Addendum)
Adjusted the patient PPM arrival time to 12 pm. Patient verbalized understanding.   Patient pick AV node ablation date and pre op lab date.   Verbalized read back with date and time.

## 2021-03-16 ENCOUNTER — Ambulatory Visit (INDEPENDENT_AMBULATORY_CARE_PROVIDER_SITE_OTHER): Payer: Medicare PPO

## 2021-03-16 ENCOUNTER — Other Ambulatory Visit: Payer: Self-pay

## 2021-03-16 DIAGNOSIS — I4819 Other persistent atrial fibrillation: Secondary | ICD-10-CM

## 2021-03-16 DIAGNOSIS — I5032 Chronic diastolic (congestive) heart failure: Secondary | ICD-10-CM

## 2021-03-16 LAB — ECHOCARDIOGRAM COMPLETE
AR max vel: 2.32 cm2
AV Area VTI: 2.27 cm2
AV Area mean vel: 2.23 cm2
AV Mean grad: 3 mmHg
AV Peak grad: 4.8 mmHg
Ao pk vel: 1.1 m/s
Area-P 1/2: 3.23 cm2
Calc EF: 58.6 %
S' Lateral: 2.9 cm
Single Plane A2C EF: 56.6 %
Single Plane A4C EF: 60.7 %

## 2021-03-19 ENCOUNTER — Telehealth: Payer: Self-pay | Admitting: *Deleted

## 2021-03-19 ENCOUNTER — Telehealth: Payer: Self-pay | Admitting: Cardiology

## 2021-03-19 ENCOUNTER — Encounter: Payer: Self-pay | Admitting: *Deleted

## 2021-03-19 NOTE — Telephone Encounter (Signed)
Forwarded labs to PCP and spoke to RN Santiago Glad about Dr. Mardene Speak concerns. Patient has appt with them 03/26/21. Santiago Glad will check with Dr. Hall Busing to see if they can work her in sooner.

## 2021-03-19 NOTE — Telephone Encounter (Signed)
Patient calling received blood sugar level was told it was elevated. Has concerns regarding pacemaker scheduled for Friday. Please assist on what to do to get lowered.

## 2021-03-19 NOTE — Telephone Encounter (Signed)
-----   Message from Darrell Jewel, RN sent at 03/15/2021  6:00 PM EST -----  ----- Message ----- From: Darrell Jewel, RN Sent: 03/15/2021   6:00 PM EST To: Darrell Jewel, RN  Ms Runkel needs to see her Primary care physician urgently because of her elevated blood sugar readings. she needs to see the PCP early next week. I won't be able to implant a pacemaker if the sugars are this high on the day of the surgery.

## 2021-03-19 NOTE — Telephone Encounter (Signed)
See other phone note

## 2021-03-19 NOTE — Telephone Encounter (Signed)
The patient went and saw Endocrinololgy at [facility] Dr. Gabriel Carina this morning. She increased Novolog Flexpen sliding scale that she takes TID with meals and Tresiva up to 140 unit QHS from 120 unit.

## 2021-03-23 ENCOUNTER — Other Ambulatory Visit: Payer: Self-pay

## 2021-03-23 ENCOUNTER — Encounter (HOSPITAL_COMMUNITY)
Admission: RE | Disposition: A | Discharge status: Home / Self Care | Payer: Medicare PPO | Source: Home / Self Care | Attending: Cardiology

## 2021-03-23 ENCOUNTER — Encounter (HOSPITAL_COMMUNITY): Payer: Self-pay | Admitting: Cardiology

## 2021-03-23 ENCOUNTER — Ambulatory Visit (HOSPITAL_COMMUNITY)
Admission: RE | Admit: 2021-03-23 | Discharge: 2021-03-24 | Disposition: A | Discharge status: Home / Self Care | Payer: Medicare PPO | Attending: Cardiology | Admitting: Cardiology

## 2021-03-23 DIAGNOSIS — I110 Hypertensive heart disease with heart failure: Secondary | ICD-10-CM | POA: Insufficient documentation

## 2021-03-23 DIAGNOSIS — E119 Type 2 diabetes mellitus without complications: Secondary | ICD-10-CM | POA: Insufficient documentation

## 2021-03-23 DIAGNOSIS — I5032 Chronic diastolic (congestive) heart failure: Secondary | ICD-10-CM | POA: Diagnosis not present

## 2021-03-23 DIAGNOSIS — K7460 Unspecified cirrhosis of liver: Secondary | ICD-10-CM | POA: Diagnosis not present

## 2021-03-23 DIAGNOSIS — I4811 Longstanding persistent atrial fibrillation: Secondary | ICD-10-CM | POA: Insufficient documentation

## 2021-03-23 DIAGNOSIS — I851 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 bleeding: Secondary | ICD-10-CM | POA: Insufficient documentation

## 2021-03-23 DIAGNOSIS — I495 Sick sinus syndrome: Secondary | ICD-10-CM | POA: Diagnosis present

## 2021-03-23 DIAGNOSIS — E785 Hyperlipidemia, unspecified: Secondary | ICD-10-CM | POA: Diagnosis not present

## 2021-03-23 DIAGNOSIS — Z794 Long term (current) use of insulin: Secondary | ICD-10-CM | POA: Insufficient documentation

## 2021-03-23 DIAGNOSIS — Z7985 Long-term (current) use of injectable non-insulin antidiabetic drugs: Secondary | ICD-10-CM | POA: Diagnosis not present

## 2021-03-23 DIAGNOSIS — Z950 Presence of cardiac pacemaker: Secondary | ICD-10-CM | POA: Diagnosis present

## 2021-03-23 DIAGNOSIS — Z853 Personal history of malignant neoplasm of breast: Secondary | ICD-10-CM | POA: Insufficient documentation

## 2021-03-23 DIAGNOSIS — G4733 Obstructive sleep apnea (adult) (pediatric): Secondary | ICD-10-CM | POA: Diagnosis not present

## 2021-03-23 DIAGNOSIS — R001 Bradycardia, unspecified: Secondary | ICD-10-CM | POA: Diagnosis present

## 2021-03-23 DIAGNOSIS — I480 Paroxysmal atrial fibrillation: Secondary | ICD-10-CM | POA: Diagnosis present

## 2021-03-23 DIAGNOSIS — K589 Irritable bowel syndrome without diarrhea: Secondary | ICD-10-CM | POA: Diagnosis not present

## 2021-03-23 HISTORY — PX: PACEMAKER IMPLANT: EP1218

## 2021-03-23 LAB — TYPE AND SCREEN
ABO/RH(D): A POS
Antibody Screen: NEGATIVE

## 2021-03-23 LAB — GLUCOSE, CAPILLARY
Glucose-Capillary: 257 mg/dL — ABNORMAL HIGH (ref 70–99)
Glucose-Capillary: 189 mg/dL — ABNORMAL HIGH (ref 70–99)
Glucose-Capillary: 290 mg/dL — ABNORMAL HIGH (ref 70–99)

## 2021-03-23 SURGERY — PACEMAKER IMPLANT

## 2021-03-23 MED ORDER — HEPARIN (PORCINE) IN NACL 1000-0.9 UT/500ML-% IV SOLN
INTRAVENOUS | Status: DC | PRN
  Administered 2021-03-23: 500 mL

## 2021-03-23 MED ORDER — MAGNESIUM OXIDE -MG SUPPLEMENT 400 (240 MG) MG PO TABS
400.0000 mg | ORAL_TABLET | Freq: Two times a day (BID) | ORAL | Status: DC
  Administered 2021-03-23 – 2021-03-24 (×2): 400 mg via ORAL
  Filled 2021-03-23 (×2): qty 1

## 2021-03-23 MED ORDER — SODIUM CHLORIDE 0.9 % IV SOLN
80.0000 mg | INTRAVENOUS | Status: AC
  Administered 2021-03-23: 80 mg
  Filled 2021-03-23: qty 2

## 2021-03-23 MED ORDER — SODIUM CHLORIDE 0.9 % IV SOLN: INTRAVENOUS | Status: DC

## 2021-03-23 MED ORDER — LIDOCAINE HCL (PF) 1 % IJ SOLN
INTRAMUSCULAR | Status: DC | PRN
  Administered 2021-03-23: 60 mL

## 2021-03-23 MED ORDER — NADOLOL 20 MG PO TABS
20.0000 mg | ORAL_TABLET | Freq: Every day | ORAL | Status: DC
Start: 2021-03-24 — End: 2021-03-24
  Administered 2021-03-24: 20 mg via ORAL
  Filled 2021-03-23: qty 1

## 2021-03-23 MED ORDER — LEVOTHYROXINE SODIUM 75 MCG PO TABS
75.0000 ug | ORAL_TABLET | Freq: Every day | ORAL | Status: DC
  Administered 2021-03-24: 75 ug via ORAL
  Filled 2021-03-23: qty 1

## 2021-03-23 MED ORDER — MIDAZOLAM HCL 5 MG/5ML IJ SOLN
INTRAMUSCULAR | Status: DC | PRN
  Administered 2021-03-23 (×2): 1 mg via INTRAVENOUS

## 2021-03-23 MED ORDER — ONDANSETRON HCL 4 MG/2ML IJ SOLN: 4.0000 mg | Freq: Four times a day (QID) | INTRAMUSCULAR | Status: DC | PRN

## 2021-03-23 MED ORDER — POTASSIUM CHLORIDE CRYS ER 20 MEQ PO TBCR
20.0000 meq | EXTENDED_RELEASE_TABLET | Freq: Two times a day (BID) | ORAL | Status: DC
  Administered 2021-03-23 – 2021-03-24 (×2): 20 meq via ORAL
  Filled 2021-03-23 (×2): qty 1

## 2021-03-23 MED ORDER — SODIUM CHLORIDE 0.9% IV SOLUTION: Freq: Once | INTRAVENOUS | Status: DC

## 2021-03-23 MED ORDER — INSULIN ASPART 100 UNIT/ML IJ SOLN
0.0000 [IU] | Freq: Every day | INTRAMUSCULAR | Status: DC
  Administered 2021-03-23: 3 [IU] via SUBCUTANEOUS

## 2021-03-23 MED ORDER — CHLORHEXIDINE GLUCONATE 4 % EX LIQD: 4.0000 "application " | Freq: Once | CUTANEOUS | Status: DC

## 2021-03-23 MED ORDER — GABAPENTIN 300 MG PO CAPS
300.0000 mg | ORAL_CAPSULE | Freq: Every day | ORAL | Status: DC
  Administered 2021-03-23: 300 mg via ORAL
  Filled 2021-03-23: qty 1

## 2021-03-23 MED ORDER — HEPARIN (PORCINE) IN NACL 1000-0.9 UT/500ML-% IV SOLN
INTRAVENOUS | Status: AC
  Filled 2021-03-23: qty 500

## 2021-03-23 MED ORDER — INSULIN GLARGINE-YFGN 100 UNIT/ML ~~LOC~~ SOLN
70.0000 [IU] | Freq: Two times a day (BID) | SUBCUTANEOUS | Status: DC
  Administered 2021-03-23 – 2021-03-24 (×2): 70 [IU] via SUBCUTANEOUS
  Filled 2021-03-23 (×3): qty 0.7

## 2021-03-23 MED ORDER — SPIRONOLACTONE 12.5 MG HALF TABLET
12.5000 mg | ORAL_TABLET | Freq: Every day | ORAL | Status: DC
  Administered 2021-03-24: 12.5 mg via ORAL
  Filled 2021-03-23: qty 1

## 2021-03-23 MED ORDER — CEFAZOLIN SODIUM-DEXTROSE 2-4 GM/100ML-% IV SOLN
2.0000 g | INTRAVENOUS | Status: AC
  Administered 2021-03-23: 2 g via INTRAVENOUS

## 2021-03-23 MED ORDER — PANTOPRAZOLE SODIUM 40 MG PO TBEC
40.0000 mg | DELAYED_RELEASE_TABLET | Freq: Every day | ORAL | Status: DC
  Administered 2021-03-24: 40 mg via ORAL
  Filled 2021-03-23: qty 1

## 2021-03-23 MED ORDER — TORSEMIDE 20 MG PO TABS
20.0000 mg | ORAL_TABLET | Freq: Every day | ORAL | Status: DC
  Administered 2021-03-24: 20 mg via ORAL
  Filled 2021-03-23: qty 1

## 2021-03-23 MED ORDER — MIDAZOLAM HCL 5 MG/5ML IJ SOLN
INTRAMUSCULAR | Status: AC
  Filled 2021-03-23: qty 5

## 2021-03-23 MED ORDER — INSULIN ASPART 100 UNIT/ML IJ SOLN
0.0000 [IU] | Freq: Three times a day (TID) | INTRAMUSCULAR | Status: DC
  Administered 2021-03-23: 4 [IU] via SUBCUTANEOUS
  Administered 2021-03-24: 7 [IU] via SUBCUTANEOUS

## 2021-03-23 MED ORDER — POVIDONE-IODINE 10 % EX SWAB
2.0000 "application " | Freq: Once | CUTANEOUS | Status: AC
  Administered 2021-03-23: 2 via TOPICAL

## 2021-03-23 MED ORDER — CEFAZOLIN SODIUM-DEXTROSE 2-4 GM/100ML-% IV SOLN
INTRAVENOUS | Status: AC
  Filled 2021-03-23: qty 100

## 2021-03-23 MED ORDER — IOHEXOL 350 MG/ML SOLN
INTRAVENOUS | Status: DC | PRN
  Administered 2021-03-23: 10 mL

## 2021-03-23 MED ORDER — SODIUM CHLORIDE 0.9 % IV SOLN
INTRAVENOUS | Status: AC
  Filled 2021-03-23: qty 2

## 2021-03-23 MED ORDER — LIDOCAINE HCL (PF) 1 % IJ SOLN
INTRAMUSCULAR | Status: AC
  Filled 2021-03-23: qty 30

## 2021-03-23 MED ORDER — OXYCODONE HCL 5 MG PO TABS
2.5000 mg | ORAL_TABLET | ORAL | Status: DC | PRN
Start: 2021-03-23 — End: 2021-03-24
  Administered 2021-03-23 – 2021-03-24 (×2): 2.5 mg via ORAL
  Filled 2021-03-23 (×2): qty 1

## 2021-03-23 MED ORDER — ACETAMINOPHEN 325 MG PO TABS
325.0000 mg | ORAL_TABLET | ORAL | Status: DC | PRN
  Administered 2021-03-23 – 2021-03-24 (×2): 650 mg via ORAL
  Filled 2021-03-23 (×2): qty 2

## 2021-03-23 SURGICAL SUPPLY — 14 items
CABLE SURGICAL S-101-97-12 (CABLE) ×3 IMPLANT
KIT ACCESSORY SELECTRA FIX CVD (MISCELLANEOUS) ×1 IMPLANT
KIT MICROPUNCTURE NIT STIFF (SHEATH) ×1 IMPLANT
LEAD SELECTRA 3D-40-42 (CATHETERS) ×1 IMPLANT
LEAD SOLIA S PRO MRI 53 (Lead) ×1 IMPLANT
LEAD SOLIA S PRO MRI 60 (Lead) ×1 IMPLANT
PACEMAKER EDORA 8DR-T MRI (Pacemaker) ×1 IMPLANT
PAD DEFIB RADIO PHYSIO CONN (PAD) ×3 IMPLANT
POUCH AIGIS-R ANTIBACT PPM (Mesh General) ×2 IMPLANT
POUCH AIGIS-R ANTIBACT PPM MED (Mesh General) IMPLANT
SHEATH 7FR PRELUDE SNAP 13 (SHEATH) ×1 IMPLANT
SHEATH 9FR PRELUDE SNAP 13 (SHEATH) ×1 IMPLANT
SHEATH PROBE COVER 6X72 (BAG) ×1 IMPLANT
TRAY PACEMAKER INSERTION (PACKS) ×3 IMPLANT

## 2021-03-23 NOTE — Interval H&P Note (Signed)
History and Physical Interval Note:  03/23/2021 2:41 PM  Annette Foster  has presented today for surgery, with the diagnosis of bradycardia.  The various methods of treatment have been discussed with the patient and family. After consideration of risks, benefits and other options for treatment, the patient has consented to  Procedure(s): PACEMAKER IMPLANT (N/A) as a surgical intervention.  The patient's history has been reviewed, patient examined, no change in status, stable for surgery.  I have reviewed the patient's chart and labs.  Questions were answered to the patient's satisfaction.     Anahis Furgeson T Treon Kehl

## 2021-03-23 NOTE — Discharge Instructions (Addendum)
° ° °  Supplemental Discharge Instructions for  Pacemaker/Defibrillator Patients   Activity No heavy lifting or vigorous activity with your left/right arm for 6 to 8 weeks.  Do not raise your left/right arm above your head for one week.  Gradually raise your affected arm as drawn below.             03/28/21                     03/29/21                    03/30/21                   03/31/21 __  NO DRIVING for   1 week  ; you may begin driving on  2/95/62  .  WOUND CARE Keep the wound area clean and dry.  Do not get this area wet , no showers for one week; you may shower on  03/31/21   . The tape/steri-strips on your wound will fall off; do not pull them off.  No bandage is needed on the site.  DO  NOT apply any creams, oils, or ointments to the wound area. If you notice any drainage or discharge from the wound, any swelling or bruising at the site, or you develop a fever > 101? F after you are discharged home, call the office at once.  Special Instructions You are still able to use cellular telephones; use the ear opposite the side where you have your pacemaker/defibrillator.  Avoid carrying your cellular phone near your device. When traveling through airports, show security personnel your identification card to avoid being screened in the metal detectors.  Ask the security personnel to use the hand wand. Avoid arc welding equipment, MRI testing (magnetic resonance imaging), TENS units (transcutaneous nerve stimulators).  Call the office for questions about other devices. Avoid electrical appliances that are in poor condition or are not properly grounded. Microwave ovens are safe to be near or to operate.

## 2021-03-23 NOTE — Progress Notes (Signed)
Consulted for Insulin Management Per CareEverywhere note and per conversation with patient PTA insulin regimen is: Take Tresiba U200 140 units nightly  Take NovoLog three times daily before the meals  Take 48 units of NovoLog if sugar is under 100 Take 52 units of NovoLog if sugar is 101-150 Take 56 units of NovoLog if sugar is 151-200 Take 60 units of NovoLog if sugar is 201-250 Take 64 units of NovoLog if sugar is 251-300 Take 68 units of NovoLog if sugar is 301 or higher.  Orders reviewed -  -Will order insulin glargine (Semglee) 70 units subQ BID (to equal similar total daily amount but injecting less volume at one time) -Will order resistance sliding scale ACHS  -Consulted DM coordinator for assisting with diabetes management while in hospital.   Patient was informed of plan.  Antonietta Jewel, PharmD, [place] Clinical Pharmacist  Phone: [phone] 03/23/2021 5:57 PM  Please check AMION for all [place] phone numbers After 10:00 PM, call [place] [phone]

## 2021-03-24 ENCOUNTER — Ambulatory Visit (HOSPITAL_COMMUNITY): Payer: Medicare PPO

## 2021-03-24 DIAGNOSIS — I851 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 bleeding: Secondary | ICD-10-CM | POA: Diagnosis not present

## 2021-03-24 DIAGNOSIS — I4811 Longstanding persistent atrial fibrillation: Secondary | ICD-10-CM | POA: Diagnosis not present

## 2021-03-24 DIAGNOSIS — Z950 Presence of cardiac pacemaker: Secondary | ICD-10-CM | POA: Diagnosis not present

## 2021-03-24 DIAGNOSIS — I495 Sick sinus syndrome: Secondary | ICD-10-CM | POA: Diagnosis not present

## 2021-03-24 DIAGNOSIS — I5032 Chronic diastolic (congestive) heart failure: Secondary | ICD-10-CM | POA: Diagnosis not present

## 2021-03-24 LAB — GLUCOSE, CAPILLARY: Glucose-Capillary: 210 mg/dL — ABNORMAL HIGH (ref 70–99)

## 2021-03-24 LAB — BPAM PLATELET PHERESIS
Blood Product Expiration Date: 202302122359
ISSUE DATE / TIME: 202302101356
Unit Type and Rh: 6200

## 2021-03-24 LAB — PREPARE PLATELET PHERESIS: Unit division: 0

## 2021-03-24 LAB — HEMOGLOBIN A1C
Hgb A1c MFr Bld: 8.3 % — ABNORMAL HIGH (ref 4.8–5.6)
Mean Plasma Glucose: 191.51 mg/dL

## 2021-03-24 MED ORDER — OXYCODONE HCL 5 MG PO TABS: 5.0000 mg | ORAL_TABLET | ORAL | Status: DC | PRN

## 2021-03-24 MED ORDER — ACETAMINOPHEN 325 MG PO TABS: 325.0000 mg | ORAL_TABLET | ORAL | Status: DC | PRN

## 2021-03-24 MED ORDER — NADOLOL 20 MG PO TABS
20.0000 mg | ORAL_TABLET | Freq: Every day | ORAL | 11 refills | Status: DC
Start: 2021-03-24 — End: 2021-06-20

## 2021-03-24 NOTE — Progress Notes (Signed)
Pt discharged by treatment team. Discharge instructions provided by attending physician and RN using teach back method. All questions and concerns addressed. Pt verbalized understanding. Awaiting on daughter for transport.

## 2021-03-24 NOTE — Discharge Summary (Addendum)
°Discharge Summary  °  °Patient ID: Annette Foster °MRN: [id number]; DOB: 11/02/1940 ° °Admit date: 03/23/2021 °Discharge date: 03/24/2021 ° °PCP:  Tate, Denny C, MD °  °[facility] Providers °Cardiologist:  Christopher End, MD  °Electrophysiologist:  CAMERON T LAMBERT, MD     ° ° °Discharge Diagnoses  °  °Principal Problem: °  Symptomatic sinus bradycardia °Active Problems: °  Paroxysmal atrial fibrillation (HCC) °  Pacemaker ° ° ° °Diagnostic Studies/Procedures  °  °PACEMAKER: 03/23/2021 °CONCLUSIONS:  ° 1. Symptomatic tachycardia-bradycardia syndrome ° 2. Longstanding persistent atrial fibrillation ° 3. Dual chamber permanent pacemaker with left bundle area lead ° 4.  No early apparent complications. °PREPROCEDURE DIAGNOSES:  °1. Symptomatic tachycardia-bradycardia syndrome °2. Longstanding persistent atrial fibrillation °   ° POSTPROCEDURE DIAGNOSES:  °1. Symptomatic tachycardia-bradycardia syndrome °2. Longstanding persistent atrial fibrillation °   ° PROCEDURES:   ° 1. Dual chamber permanent pacemaker implant °    ° INTRODUCTION:  Annette Foster is a 80 y.o. patient with symptomatic tachycardia-bradycardia syndrome and longstanding persistent atrial fibrillation who presents to the EP lab for PPM implant.  °   °DESCRIPTION OF PROCEDURE:  Informed written consent was obtained and the patient was brought to the electrophysiology lab in the fasting state. Prophylactic antibiotics were given. The patient was adequately sedated with intravenous Versed as outlined in the nursing report.  The patient's right chest was prepped and draped in the usual sterile fashion by the EP lab staff.  The skin overlying the deltopectoral region was infiltrated with lidocaine for local analgesia.  An incision was created over the deltopectoral region.  A prepectoral pocket was fashioned using a combination of sharp and blunt dissection.  Electrocautery was used to assure hemostasis.  ° °Lead Placement: °The right axillary  vein was cannulated using ultrasound guidance and a micropuncture kit.  Through the axillary vein a Wholey wire was passed to the RV. Over the wire, a septal sheath was passed to the mid septum. An RV lead (model Solia S 60, serial [id number]) was passed through the sheath and fixed to the RV septum.  It was advanced until there was evidence of capture of the left sided conduction system (stim-lateral QRS time 60ms, QRS duration 110ms). There it displayed excelling pacing (0.6 V at 0.5ms) and sensing thresholds (7.6 mV) with an acceptable impedance (624 ohms). The lead was secured to the pectoral fascia. Next, the right atrial lead (model Solia S 53, serial [id number]) was advanced to the RA appendage where it displayed excellent sensing (3 mV) thresholds with an acceptable impedance (487 ohms). It was secured to the pectoral fascia. The pocket was irrigated with copious vancomycin solution.  The leads were then  connected to a pulse generator (model Edora 8 DR-T, serial [id number]). The pocket was closed in layers of absorbable suture. EBL < 10mL. Steri-strips and a sterile dressing were applied. °  °During this procedure the patient is administered a total of Versed 2 mg to achieve and maintain moderate conscious sedation.  The patient's heart rate, blood pressure, and oxygen saturation are monitored continuously during the procedure. I was present face-to-face 100% of the time sedation was administered. Total sedation time was 50 min. ° °POUCH AIGIS-R ANTIBACT PPM Model/Cat number: [id number]   °Manufacturer: [employer] Lot number: [id number]  ° °   ° CONCLUSIONS:  ° 1. Symptomatic tachycardia-bradycardia syndrome ° 2. Longstanding persistent atrial fibrillation ° 3. Dual chamber permanent pacemaker with left bundle area lead ° 4.  No early   apparent complications.  ° °Cameron T. Lambert, MD, FACC °Cardiac Electrophysiology ° °ECHO: 03/16/2021 ° 1. Left ventricular ejection fraction, by estimation, is 55  to 60%. The  °left ventricle has normal function. The left ventricle has no regional  °wall motion abnormalities. There is moderate concentric left ventricular hypertrophy. Left ventricular diastolic parameters are indeterminate.  ° 2. Right ventricular systolic function is normal. The right ventricular  °size is normal. Mildly increased right ventricular wall thickness. There is normal pulmonary artery systolic pressure. The estimated right ventricular systolic pressure is 24.7 mmHg.  ° 3. Left atrial size was mildly dilated.  ° 4. A small pericardial effusion is present.  ° 5. The mitral valve is normal in structure. No evidence of mitral valve  °regurgitation. No evidence of mitral stenosis.  ° 6. The aortic valve is normal in structure. Aortic valve regurgitation is  °not visualized. Aortic valve sclerosis is present, with no evidence of  °aortic valve stenosis.  ° 7. The inferior vena cava is normal in size with greater than 50%  °respiratory variability, suggesting right atrial pressure of 3 mmHg.  °_____________ °  °History of Present Illness   °  °Annette Foster is a 80 y.o. female with DM, HTN, HLD, D-CHF, IBS, IDA, esophageal varices, hepatic cirrhosis OSA on CPAP, breast CA, highly symptomatic longstanding persistent atrial fibrillation, with bradycardia when in sinus rhythm. ° °She was seen by Dr. Lambert in the office and because of intolerance to anticoagulation (bleeding) the decision was made to implant a pacemaker, then wait 8 weeks and do an AV node ablation. ° °She is also a potential candidate for Watchman device. ° °Hospital Course  °   °Consultants: None ° °The pacemaker implant was scheduled and she came to the hospital for the procedure on 03/23/2021.  The procedure went well, no complications.  She was admitted overnight. ° °On 2/11, she was seen by Dr. Lambert and all data were reviewed.  She had a right anterior wall pacemaker placed with the leads in appropriate position based on  chest x-ray, no pneumothorax.   ° °The device was functioning well, with an ECG showing atrial paced rhythm and a heart rate of 60.  She cannot be anticoagulated because of bleeding issues. ° °No further inpatient work-up is indicated and she is considered stable for discharge, to follow-up as an outpatient. ° °_____________ ° °Discharge Vitals °Blood pressure 122/66, pulse 60, temperature 97.6 °F (36.4 °C), temperature source Oral, resp. rate 20, height 5' 3" (1.6 m), weight 72.1 kg, SpO2 98 %.  °Filed Weights  ° 03/23/21 1224  °Weight: 72.1 kg  ° ° °Labs & Radiologic Studies  °  °CBC °No results for input(s): WBC, NEUTROABS, HGB, HCT, MCV, PLT in the last 72 hours. °Basic Metabolic Panel °No results for input(s): NA, K, CL, CO2, GLUCOSE, BUN, CREATININE, CALCIUM, MG, PHOS in the last 72 hours. °Liver Function Tests °No results for input(s): AST, ALT, ALKPHOS, BILITOT, PROT, ALBUMIN in the last 72 hours. °No results for input(s): LIPASE, AMYLASE in the last 72 hours. °High Sensitivity Troponin:   °No results for input(s): TROPONINIHS in the last 720 hours.  °BNP °Invalid input(s): POCBNP °D-Dimer °No results for input(s): DDIMER in the last 72 hours. °Hemoglobin A1C °Recent Labs  °  03/24/21 °0256  °HGBA1C 8.3*  ° °Fasting Lipid Panel °No results for input(s): CHOL, HDL, LDLCALC, TRIG, CHOLHDL, LDLDIRECT in the last 72 hours. °Thyroid Function Tests °No results for input(s): TSH, T4TOTAL, T3FREE, THYROIDAB   in the last 72 hours. ° °Invalid input(s): FREET3 °_____________  °DG Chest 2 View ° °Result Date: 03/24/2021 °CLINICAL DATA:  Pacemaker placement. EXAM: CHEST - 2 VIEW COMPARISON:  02/16/2020 and older studies. FINDINGS: New right anterior chest wall sequential pacemaker has its leads position within the right ventricle and right atrium. There are areas of heterogeneous interstitial thickening, most prominent in the left mid lung and lung bases, consistent with chronic fibrosis. Superimposed infection is not  excluded. The appearance is stable from the most recent prior exam. No convincing pleural effusion. No pneumothorax. IMPRESSION: 1. New right anterior chest wall sequential pacemaker. Leads appear well positioned. 2. No acute findings.  No pneumothorax. Electronically Signed   By: David  Ormond M.D.   On: 03/24/2021 08:03  ° °EP PPM/ICD IMPLANT ° °Result Date: 03/23/2021 ° CONCLUSIONS:  1. Symptomatic tachycardia-bradycardia syndrome  2. Longstanding persistent atrial fibrillation  3. Dual chamber permanent pacemaker with left bundle area lead  4.  No early apparent complications.  ° °ECHOCARDIOGRAM COMPLETE ° °Result Date: 03/16/2021 °   ECHOCARDIOGRAM REPORT   Patient Name:   Annette Foster Date of Exam: 03/16/2021 Medical Rec #:  [id number]           Height:       63.0 in Accession #:    [id number]          Weight:       160.0 lb Date of Birth:  11/17/1940           BSA:          1.759 m² Patient Age:    80 years            BP:           110/60 mmHg Patient Gender: F                   HR:           114 bpm. Exam Location:  [facility] Procedure: 2D Echo, Color Doppler and Cardiac Doppler Indications:    Atrial fibrillation and Flutter  History:        Patient has prior history of Echocardiogram examinations, most                 recent 02/08/2020. CHF, Arrythmias:Atrial Fibrillation and                 Atrial Flutter; Risk Factors:Non-Smoker, Hypertension, Sleep                 Apnea and Diabetes.  Sonographer:    Gary Joseph RDMS, RVT, RDCS Referring Phys: [id number] CAMERON T LAMBERT IMPRESSIONS  1. Left ventricular ejection fraction, by estimation, is 55 to 60%. The left ventricle has normal function. The left ventricle has no regional wall motion abnormalities. There is moderate concentric left ventricular hypertrophy. Left ventricular diastolic parameters are indeterminate.  2. Right ventricular systolic function is normal. The right ventricular size is normal. Mildly increased right ventricular wall thickness.  There is normal pulmonary artery systolic pressure. The estimated right ventricular systolic pressure is 24.7 mmHg.  3. Left atrial size was mildly dilated.  4. A small pericardial effusion is present.  5. The mitral valve is normal in structure. No evidence of mitral valve regurgitation. No evidence of mitral stenosis.  6. The aortic valve is normal in structure. Aortic valve regurgitation is not visualized. Aortic valve sclerosis is present, with no evidence of aortic valve stenosis.  7. The inferior vena cava   vena cava is normal in size with greater than 50% respiratory variability, suggesting right atrial pressure of 3 mmHg. FINDINGS  Left Ventricle: Left ventricular ejection fraction, by estimation, is 55 to 60%. The left ventricle has normal function. The left ventricle has no regional wall motion abnormalities. The left ventricular internal cavity size was normal in size. There is  moderate concentric left ventricular hypertrophy. Left ventricular diastolic parameters are indeterminate. Right Ventricle: The right ventricular size is normal. Mildly increased right ventricular wall thickness. Right ventricular systolic function is normal. There is normal pulmonary artery systolic pressure. The tricuspid regurgitant velocity is 2.22 m/s, and with an assumed right atrial pressure of 5 mmHg, the estimated right ventricular systolic pressure is 40.9 mmHg. Left Atrium: Left atrial size was mildly dilated. Right Atrium: Right atrial size was normal in size. Pericardium: A small pericardial effusion is present. Mitral Valve: The mitral valve is normal in structure. No evidence of mitral valve regurgitation. No evidence of mitral valve stenosis. Tricuspid Valve: The tricuspid valve is normal in structure. Tricuspid valve regurgitation is not demonstrated. No evidence of tricuspid stenosis. Aortic Valve: The aortic valve is normal in structure. Aortic valve regurgitation is not visualized. Aortic valve sclerosis is present, with  no evidence of aortic valve stenosis. Aortic valve mean gradient measures 3.0 mmHg. Aortic valve peak gradient measures 4.8 mmHg. Aortic valve area, by VTI measures 2.27 cm. Pulmonic Valve: The pulmonic valve was normal in structure. Pulmonic valve regurgitation is not visualized. No evidence of pulmonic stenosis. Aorta: The aortic root is normal in size and structure. Venous: The inferior vena cava is normal in size with greater than 50% respiratory variability, suggesting right atrial pressure of 3 mmHg. IAS/Shunts: No atrial level shunt detected by color flow Doppler.  LEFT VENTRICLE PLAX 2D LVIDd:         3.70 cm     Diastology LVIDs:         2.90 cm     LV e' medial:    5.54 cm/s LV PW:         1.00 cm     LV E/e' medial:  27.4 LV IVS:        1.30 cm     LV e' lateral:   5.45 cm/s LVOT diam:     2.00 cm     LV E/e' lateral: 27.9 LV SV:         43 LV SV Index:   24 LVOT Area:     3.14 cm  LV Volumes (MOD) LV vol d, MOD A2C: 41.9 ml LV vol d, MOD A4C: 36.4 ml LV vol s, MOD A2C: 18.2 ml LV vol s, MOD A4C: 14.3 ml LV SV MOD A2C:     23.7 ml LV SV MOD A4C:     36.4 ml LV SV MOD BP:      23.2 ml RIGHT VENTRICLE RV S prime:     10.70 cm/s TAPSE (M-mode): 1.5 cm LEFT ATRIUM             Index        RIGHT ATRIUM           Index LA diam:        3.80 cm 2.16 cm/m   RA Area:     18.90 cm LA Vol (A2C):   62.2 ml 35.37 ml/m  RA Volume:   52.10 ml  29.62 ml/m LA Vol (A4C):   67.3 ml 38.27 ml/m LA Biplane Vol: 68.1 ml 38.72  AORTIC VALVE                    PULMONIC VALVE AV Area (Vmax):    2.32 cm²     PV Vmax:       1.03 m/s AV Area (Vmean):   2.23 cm²     PV Peak grad:  4.2 mmHg AV Area (VTI):     2.27 cm² AV Vmax:           110.00 cm/s AV Vmean:          78.700 cm/s AV VTI:            0.190 m AV Peak Grad:      4.8 mmHg AV Mean Grad:      3.0 mmHg LVOT Vmax:         81.30 cm/s LVOT Vmean:        55.800 cm/s LVOT VTI:          0.137 m LVOT/AV VTI ratio: 0.72  AORTA Ao Root diam: 2.80 cm Ao Asc diam:  3.00 cm Ao  Arch diam: 2.6 cm MITRAL VALVE                TRICUSPID VALVE MV Area (PHT): 3.23 cm²     TR Peak grad:   19.7 mmHg MV Decel Time: 235 msec     TR Vmax:        222.00 cm/s MV E velocity: 152.00 cm/s                             SHUNTS                             Systemic VTI:  0.14 m                             Systemic Diam: 2.00 cm Timothy Gollan MD Electronically signed by Timothy Gollan MD Signature Date/Time: 03/16/2021/6:35:48 PM    Final    °Disposition  ° °Pt is being discharged home today in good condition. ° °Follow-up Plans & Appointments  ° ° ° °Discharge Instructions   ° ° Diet - low sodium heart healthy   Complete by: As directed °  ° Increase activity slowly   Complete by: As directed °  ° °  ° ° °Discharge Medications  ° °Allergies as of 03/24/2021   ° °   Reactions  ° Codeine Itching, Nausea And Vomiting  ° Demeclocycline Rash  ° Demerol [meperidine] Itching, Nausea And Vomiting  ° Hydrocodone Itching, Nausea And Vomiting  ° Oxycodone Itching, Nausea And Vomiting  ° Pentazocine Itching, Nausea And Vomiting  ° Tetracyclines & Related Rash  ° Fentanyl Itching, Nausea And Vomiting  ° D/W surgical nurse on 7/23 and pt reportedly tolerated dose during surgery.  Dose was given by MD.  Pt reported that she only gets itching and did not object to receiving med  ° °  ° °  °Medication List  °  ° °TAKE these medications   ° °acetaminophen 325 MG tablet °Commonly known as: TYLENOL °Take 1-2 tablets (325-650 mg total) by mouth every 4 (four) hours as needed for mild pain. °  °azelastine 0.05 % ophthalmic solution °Commonly known as: OPTIVAR °daily as needed. °  °benzonatate 200 MG capsule °Commonly known as: TESSALON °Take 200 mg by mouth   by mouth 3 (three) times daily as needed for cough.   clobetasol ointment 0.05 % Commonly known as: TEMOVATE Apply 1 application topically as needed (vaginal irritation).   clotrimazole 1 % cream Commonly known as: LOTRIMIN Apply 1 application topically 2 (two) times daily. Apply to  irritated vulvar area twice daily. What changed:  when to take this reasons to take this additional instructions   cyanocobalamin 1000 MCG/ML injection Commonly known as: (VITAMIN B-12) 1,000 mcg every 30 (thirty) days.   desloratadine 5 MG tablet Commonly known as: CLARINEX Take 5 mg by mouth daily.   dicyclomine 10 MG capsule Commonly known as: BENTYL Take 10 mg by mouth in the morning, at noon, and at bedtime.   docusate sodium 100 MG capsule Commonly known as: COLACE Take 100 mg by mouth 3 (three) times daily as needed for moderate constipation.   EPINEPHrine 0.3 mg/0.3 mL Soaj injection Commonly known as: EPI-PEN Inject 0.3 mg into the muscle as needed for anaphylaxis.   esomeprazole 40 MG capsule Commonly known as: NEXIUM Take 40 mg by mouth 2 (two) times daily before a meal.   estradiol 0.1 MG/GM vaginal cream Commonly known as: ESTRACE Place 1 Applicatorful vaginally daily as needed (irritation).   fenofibrate 160 MG tablet Take 160 mg by mouth at bedtime.   gabapentin 300 MG capsule Commonly known as: NEURONTIN Take 300 mg by mouth at bedtime.   hydrOXYzine 25 MG tablet Commonly known as: ATARAX Take 25 mg by mouth every 6 (six) hours as needed for anxiety, itching, nausea or vomiting.   levothyroxine 75 MCG tablet Commonly known as: SYNTHROID Take 75 mcg by mouth daily before breakfast.   magnesium oxide 400 MG tablet Commonly known as: MAG-OX Take 400 mg by mouth 2 (two) times daily.   nadolol 20 MG tablet Commonly known as: CORGARD Take 1 tablet (20 mg total) by mouth daily.   NovoLOG FlexPen 100 UNIT/ML FlexPen Generic drug: insulin aspart Inject 48-68 Units into the skin 3 (three) times daily with meals. Based on sliding scale   potassium chloride SA 20 MEQ tablet Commonly known as: KLOR-CON M Take 20 mEq by mouth 2 (two) times daily.   saccharomyces boulardii 250 MG capsule Commonly known as: FLORASTOR Take 250 mg by mouth 2 (two)  times daily.   Semaglutide (2 MG/DOSE) 8 MG/3ML Sopn Inject 2 mg into the skin every Sunday.   simvastatin 20 MG tablet Commonly known as: ZOCOR Take 20 mg by mouth every evening.   spironolactone 25 MG tablet Commonly known as: ALDACTONE TAKE 1 TABLET (25 MG TOTAL) BY MOUTH DAILY. What changed: how much to take   torsemide 20 MG tablet Commonly known as: DEMADEX TAKE 1 TABLET BY MOUTH EVERY DAY   Tresiba 100 UNIT/ML Soln Generic drug: Insulin Degludec Inject 140 Units into the skin at bedtime.   valACYclovir 500 MG tablet Commonly known as: VALTREX Take 500 mg by mouth 2 (two) times daily as needed (fever blisters).   Vitamin D 50 MCG (2000 UT) Caps Take 2,000 Units by mouth daily.           Outstanding Labs/Studies   None  Duration of Discharge Encounter   Greater than 30 minutes including physician time.  Signed, Rosaria Ferries, PA-C 03/24/2021, 9:22 AM

## 2021-03-24 NOTE — Progress Notes (Signed)
EP Progress Note  Patient Name: Annette Foster Date of Encounter: 03/24/2021  [facility] Cardiologist: Nelva Bush, MD   Subjective   NAEO. Doing well this AM.  Inpatient Medications    Scheduled Meds:  sodium chloride   Intravenous Once   gabapentin  300 mg Oral QHS   insulin aspart  0-20 Units Subcutaneous TID WC   insulin aspart  0-5 Units Subcutaneous QHS   insulin glargine-yfgn  70 Units Subcutaneous BID   levothyroxine  75 mcg Oral QAC breakfast   magnesium oxide  400 mg Oral BID   nadolol  20 mg Oral Daily   pantoprazole  40 mg Oral Daily   potassium chloride SA  20 mEq Oral BID   spironolactone  12.5 mg Oral Daily   torsemide  20 mg Oral Daily   Continuous Infusions:  PRN Meds: acetaminophen, ondansetron (ZOFRAN) IV, oxyCODONE   Vital Signs    Vitals:   03/23/21 1942 03/23/21 2100 03/23/21 2200 03/24/21 0341  BP: 124/76   135/68  Pulse: (!) 43 60 60 60  Resp:      Temp: 97.7 F (36.5 C)   97.7 F (36.5 C)  TempSrc: Oral   Oral  SpO2: 99% 97% 98% 98%  Weight:      Height:        Intake/Output Summary (Last 24 hours) at 03/24/2021 0703 Last data filed at 03/23/2021 2100 Gross per 24 hour  Intake 360 ml  Output --  Net 360 ml   Last 3 Weights 03/23/2021 03/14/2021 03/08/2021  Weight (lbs) 159 lb 160 lb 159 lb 2 oz  Weight (kg) 72.122 kg 72.576 kg 72.179 kg      Telemetry    Atrial paced, V sensed rhythm. Converted to sinus yesterday evening. -  Personally Reviewed  ECG    Personally Reviewed  Physical Exam   GEN: No acute distress.   Neck: No JVD Cardiac: RRR, no murmurs, rubs, or gallops. PPM pocket healing well. Respiratory: Clear to auscultation bilaterally. GI: Soft, nontender, non-distended  MS: No edema; No deformity. Neuro:  Nonfocal  Psych: Normal affect   Labs    High Sensitivity Troponin:  No results for input(s): TROPONINIHS in the last 720 hours.   ChemistryNo results for input(s): NA, K, CL, CO2, GLUCOSE,  BUN, CREATININE, CALCIUM, MG, PROT, ALBUMIN, AST, ALT, ALKPHOS, BILITOT, GFRNONAA, GFRAA, ANIONGAP in the last 168 hours.  Lipids No results for input(s): CHOL, TRIG, HDL, LABVLDL, LDLCALC, CHOLHDL in the last 168 hours.  HematologyNo results for input(s): WBC, RBC, HGB, HCT, MCV, MCH, MCHC, RDW, PLT in the last 168 hours. Thyroid No results for input(s): TSH, FREET4 in the last 168 hours.  BNPNo results for input(s): BNP, PROBNP in the last 168 hours.  DDimer No results for input(s): DDIMER in the last 168 hours.   Radiology    EP PPM/ICD IMPLANT  Result Date: 03/23/2021  CONCLUSIONS:  1. Symptomatic tachycardia-bradycardia syndrome  2. Longstanding persistent atrial fibrillation  3. Dual chamber permanent pacemaker with left bundle area lead  4.  No early apparent complications.    Cardiac Studies   Device interrogation shows stable lead parameters  Patient Profile     81 y.o. female with tachybrady now s/p PPPM yesterday.   Assessment & Plan    #Tachy-Brady Syndrome #Persistent atrial fibrillation S/p PPM. No anticoagulation given difficulties with bleeding  OK to discharge today. Close outpatient follow up.  For questions or updates, please contact [place] Please consult  [email or web address] for contact info under        Signed, Vickie Epley, MD  03/24/2021, 7:03 AM

## 2021-03-26 ENCOUNTER — Encounter (HOSPITAL_COMMUNITY): Payer: Self-pay | Admitting: Cardiology

## 2021-03-28 NOTE — Addendum Note (Signed)
Addended by: Ronaldo Miyamoto on: 03/28/2021 04:37 PM   Modules accepted: Orders

## 2021-03-31 ENCOUNTER — Other Ambulatory Visit: Payer: Self-pay | Admitting: Cardiovascular Disease

## 2021-03-31 DIAGNOSIS — I480 Paroxysmal atrial fibrillation: Secondary | ICD-10-CM

## 2021-04-04 ENCOUNTER — Ambulatory Visit (INDEPENDENT_AMBULATORY_CARE_PROVIDER_SITE_OTHER): Payer: Medicare PPO

## 2021-04-04 ENCOUNTER — Other Ambulatory Visit: Payer: Self-pay

## 2021-04-04 DIAGNOSIS — R001 Bradycardia, unspecified: Secondary | ICD-10-CM

## 2021-04-04 LAB — CUP PACEART INCLINIC DEVICE CHECK
Brady Statistic RA Percent Paced: 94 %
Brady Statistic RV Percent Paced: 0 %
Date Time Interrogation Session: 20230222113902
Implantable Lead Implant Date: 20230210
Implantable Lead Implant Date: 20230210
Implantable Lead Location: 753859
Implantable Lead Location: 753860
Implantable Lead Model: 377169
Implantable Lead Model: 377171
Implantable Lead Serial Number: 7000400966
Implantable Lead Serial Number: 7000402075
Implantable Pulse Generator Implant Date: 20230210
Lead Channel Impedance Value: 507 Ohm
Lead Channel Impedance Value: 624 Ohm
Lead Channel Pacing Threshold Amplitude: 0.8 V
Lead Channel Pacing Threshold Amplitude: 0.9 V
Lead Channel Pacing Threshold Pulse Width: 0.4 ms
Lead Channel Pacing Threshold Pulse Width: 0.4 ms
Lead Channel Sensing Intrinsic Amplitude: 15.8 mV
Lead Channel Sensing Intrinsic Amplitude: 3.1 mV
Lead Channel Setting Pacing Amplitude: 3 V
Lead Channel Setting Pacing Amplitude: 3 V
Lead Channel Setting Pacing Pulse Width: 0.4 ms
Pulse Gen Model: 407145
Pulse Gen Serial Number: 70331369

## 2021-04-04 NOTE — Progress Notes (Signed)
Wound check appointment. Steri-strips removed. Wound without redness or edema. Incision edges approximated, wound well healed. Normal device function. Thresholds, sensing, and impedances consistent with implant measurements. Device programmed at 3.0V for extra safety margin until 3 month visit. Histogram distribution appropriate for patient and level of activity. No mode switches or high ventricular rates noted. Patient educated about wound care, arm mobility, lifting restrictions. ROV 06/20/21 with Dr. Quentin Ore.  Patient is enrolled in remote monitoring, transmitting nightly, next scheduled summary report due 06/22/21.

## 2021-04-04 NOTE — Patient Instructions (Addendum)

## 2021-04-18 ENCOUNTER — Ambulatory Visit (INDEPENDENT_AMBULATORY_CARE_PROVIDER_SITE_OTHER): Payer: Medicare PPO | Admitting: Medical

## 2021-04-18 ENCOUNTER — Other Ambulatory Visit: Payer: Self-pay

## 2021-04-18 ENCOUNTER — Encounter: Payer: Self-pay | Admitting: Medical

## 2021-04-18 VITALS — BP 110/58 | HR 60 | Ht 63.0 in | Wt 156.5 lb

## 2021-04-18 DIAGNOSIS — Z950 Presence of cardiac pacemaker: Secondary | ICD-10-CM

## 2021-04-18 DIAGNOSIS — I495 Sick sinus syndrome: Secondary | ICD-10-CM | POA: Diagnosis not present

## 2021-04-18 DIAGNOSIS — I5032 Chronic diastolic (congestive) heart failure: Secondary | ICD-10-CM

## 2021-04-18 DIAGNOSIS — K7460 Unspecified cirrhosis of liver: Secondary | ICD-10-CM

## 2021-04-18 DIAGNOSIS — I4819 Other persistent atrial fibrillation: Secondary | ICD-10-CM

## 2021-04-18 DIAGNOSIS — K7581 Nonalcoholic steatohepatitis (NASH): Secondary | ICD-10-CM

## 2021-04-18 NOTE — Progress Notes (Signed)
Cardiology Office Note:    Date:  04/18/2021   ID:  Annette Foster, DOB 04/02/1940, MRN [id number]  PCP:  Albina Billet, MD  [facility] Cardiologist:  Nelva Bush, MD  [facility] Electrophysiologist:  Vickie Epley, MD   Referring MD: Albina Billet, MD   Chief Complaint: 3 month follow-up  History of Present Illness:    Annette Foster is a 81 y.o. female with a hx of with a hx of remote valve surgery in the 1970s, HFpEF, paroxysmal afib complicated by GIB on a/c, HTN, HLD, NASH, seizure disorder, OSA, left breast cancer s/p mastectomy who presents for follow-up of afib and valvular heart disease.    Seen by EP for watchman, however left atrial appendage morphology precludes watchman placement. Anticoagulation was not pursued given h/o GIB with esophageal varices.   The patient was referred back to EP for AV junction ablation plu permanent pacemaker. When in afib patient very symptomatic with RVR, when in NSR she is brardycardic. She underwent device implant 03/23/21.   Today, the patient reports she has been doing well from a cardiac standpoint. She is follow-up s/p pacemaker. Wound looks good. She has been able to sleep better since the pacemaker. Also her energy is much better. No LLE, orthopnea, pnd. Sees GI for cirrhosis, plan for CT scan in June. She sees Dr. Quentin Ore in April.    Past Medical History:  Diagnosis Date   Abdominal pain    Allergy    Cancer ([place]) 2017   breast cancer- Left   Cataract    CHF (congestive heart failure) (HCC)    Collagenous colitis    Diabetes mellitus without complication (HCC)    Diarrhea    Diverticulosis    Fatty liver disease, nonalcoholic    Fibrocystic breast    GERD (gastroesophageal reflux disease)    Heart murmur    Hyperlipidemia    Hypertension    Hypothyroidism    IBS (irritable bowel syndrome)    IDA (iron deficiency anemia)    Personal history of radiation therapy 2017   LEFT BREAST CA   PONV  (postoperative nausea and vomiting)    Sleep apnea    C-Pap    Past Surgical History:  Procedure Laterality Date   ABDOMINAL HYSTERECTOMY     tah bso   ABDOMINAL SURGERY     APPENDECTOMY     BREAST BIOPSY Bilateral 2016   negative   BREAST 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/2015   DCIS, papillary carcinoma in situ   BREAST BIOPSY Left 05/27/2016   BENIGN MAMMARY EPITHELIUM   BREAST BIOPSY Left 11/20/2017   affirm bx x clip BENIGN MAMMARY EPITHELIUM CONSISTENT WITH RAD THERAPY   BREAST EXCISIONAL BIOPSY Right    NEG 1980's   BREAST LUMPECTOMY Left 10/17/2015   DCIS and papillary carcinoma insitu, clear margins   CARDIAC SURGERY     has replacement valve   CATARACT EXTRACTION Right    CATARACT EXTRACTION W/PHACO Left 01/16/2021   Procedure: CATARACT EXTRACTION PHACO AND INTRAOCULAR LENS PLACEMENT (IOC) LEFT DIABETIC 8.46 00:59.8;  Surgeon: Birder Robson, MD;  Location: [place];  Service: Ophthalmology;  Laterality: Left;   CHOLECYSTECTOMY     COLONOSCOPY WITH PROPOFOL N/A 03/11/2016   Procedure: COLONOSCOPY WITH PROPOFOL;  Surgeon: Manya Silvas, MD;  Location: [facility] ENDOSCOPY;  Service: Endoscopy;  Laterality: N/A;   COLONOSCOPY WITH PROPOFOL N/A 02/18/2020   Procedure: COLONOSCOPY WITH PROPOFOL;  Surgeon: [name], MD;  Location: [facility] ENDOSCOPY;  Service: Endoscopy;  Laterality: N/A;   ESOPHAGOGASTRODUODENOSCOPY (EGD) WITH PROPOFOL N/A 03/11/2016   Procedure: ESOPHAGOGASTRODUODENOSCOPY (EGD) WITH PROPOFOL;  Surgeon: Manya Silvas, MD;  Location: [facility] ENDOSCOPY;  Service: Endoscopy;  Laterality: N/A;   ESOPHAGOGASTRODUODENOSCOPY (EGD) WITH PROPOFOL N/A 02/18/2020   Procedure: ESOPHAGOGASTRODUODENOSCOPY (EGD) WITH PROPOFOL;  Surgeon: [name], MD;  Location: [facility] ENDOSCOPY;  Service: Endoscopy;  Laterality: N/A;   ESOPHAGOGASTRODUODENOSCOPY (EGD) WITH PROPOFOL N/A 04/25/2020   Procedure: ESOPHAGOGASTRODUODENOSCOPY (EGD) WITH PROPOFOL;  Surgeon: [name], MD;   Location: [facility] ENDOSCOPY;  Service: Endoscopy;  Laterality: N/A;   ESOPHAGOGASTRODUODENOSCOPY (EGD) WITH PROPOFOL N/A 05/23/2020   Procedure: ESOPHAGOGASTRODUODENOSCOPY (EGD) WITH PROPOFOL;  Surgeon: [name], MD;  Location: [facility] ENDOSCOPY;  Service: Endoscopy;  Laterality: N/A;   FINGER ARTHROSCOPY WITH CARPOMETACARPEL ([place]) ARTHROPLASTY Right 09/03/2018   Procedure: CARPOMETACARPEL [facility]) ARTHROPLASTY RIGHT THUMB;  Surgeon: Hessie Knows, MD;  Location: [facility];  Service: Orthopedics;  Laterality: Right;   GANGLION CYST EXCISION Right 09/03/2018   Procedure: REMOVAL GANGLION OF WRIST;  Surgeon: Hessie Knows, MD;  Location: [facility];  Service: Orthopedics;  Laterality: Right;   HARDWARE REMOVAL Right 09/03/2018   Procedure: HARDWARE REMOVAL RIGHT THUMB;  Surgeon: Hessie Knows, MD;  Location: [facility];  Service: Orthopedics;  Laterality: Right;  staple removed   JOINT REPLACEMENT Left    TKR   left sinusplasty      MASTECTOMY, PARTIAL Left 10/17/2015   Procedure: MASTECTOMY PARTIAL REVISION;  Surgeon: Leonie Green, MD;  Location: [place] [facility];  Service: General;  Laterality: Left;   PACEMAKER IMPLANT N/A 03/23/2021   Procedure: PACEMAKER IMPLANT;  Surgeon: Vickie Epley, MD;  Location: [place] [facility];  Service: Cardiovascular;  Laterality: N/A;   PARTIAL MASTECTOMY WITH NEEDLE LOCALIZATION Left 09/29/2015   Procedure: PARTIAL MASTECTOMY WITH NEEDLE LOCALIZATION;  Surgeon: Leonie Green, MD;  Location: [facility];  Service: General;  Laterality: Left;   TOTAL ABDOMINAL HYSTERECTOMY W/ BILATERAL SALPINGOOPHORECTOMY      Current Medications: Current Meds  Medication Sig   acetaminophen (TYLENOL) 325 MG tablet Take 1-2 tablets (325-650 mg total) by mouth every 4 (four) hours as needed for mild pain.   azelastine (OPTIVAR) 0.05 % ophthalmic solution daily as needed.   benzonatate (TESSALON) 200 MG capsule Take 200 mg by mouth 3 (three) times daily as needed for cough.    Cholecalciferol (VITAMIN D) 50 MCG (2000 UT) CAPS Take 2,000 Units by mouth daily.   clobetasol ointment (TEMOVATE) 2.97 % Apply 1 application topically as needed (vaginal irritation).   clotrimazole (LOTRIMIN) 1 % cream Apply 1 application topically 2 (two) times daily. Apply to irritated vulvar area twice daily. (Patient taking differently: Apply 1 application. topically 2 (two) times daily as needed (irritation).)   cyanocobalamin (,VITAMIN B-12,) 1000 MCG/ML injection 1,000 mcg every 30 (thirty) days.   desloratadine (CLARINEX) 5 MG tablet Take 5 mg by mouth daily.   dicyclomine (BENTYL) 10 MG capsule Take 10 mg by mouth in the morning, at noon, and at bedtime.   docusate sodium (COLACE) 100 MG capsule Take 100 mg by mouth 3 (three) times daily as needed for moderate constipation.   EPINEPHrine 0.3 mg/0.3 mL IJ SOAJ injection Inject 0.3 mg into the muscle as needed for anaphylaxis.   esomeprazole (NEXIUM) 40 MG capsule Take 40 mg by mouth 2 (two) times daily before a meal.    estradiol (ESTRACE) 0.1 MG/GM vaginal cream Place 1 Applicatorful vaginally daily as needed (irritation).   fenofibrate 160 MG tablet Take  160 mg by mouth at bedtime.   gabapentin (NEURONTIN) 300 MG capsule Take 300 mg by mouth at bedtime.    hydrOXYzine (ATARAX/VISTARIL) 25 MG tablet Take 25 mg by mouth every 6 (six) hours as needed for anxiety, itching, nausea or vomiting.   Insulin Degludec (TRESIBA) 100 UNIT/ML SOLN Inject 140 Units into the skin at bedtime.   levothyroxine (SYNTHROID, LEVOTHROID) 75 MCG tablet Take 75 mcg by mouth daily before breakfast.    magnesium oxide (MAG-OX) 400 MG tablet Take 400 mg by mouth 2 (two) times daily.    nadolol (CORGARD) 20 MG tablet Take 1 tablet (20 mg total) by mouth daily.   NOVOLOG FLEXPEN 100 UNIT/ML FlexPen Inject 48-68 Units into the skin 3 (three) times daily with meals. Based on sliding scale   potassium chloride SA (K-DUR,KLOR-CON) 20 MEQ tablet Take 20 mEq by mouth 2  (two) times daily.    saccharomyces boulardii (FLORASTOR) 250 MG capsule Take 250 mg by mouth 2 (two) times daily.    Semaglutide, 2 MG/DOSE, 8 MG/3ML SOPN Inject 2 mg into the skin every Sunday.   simvastatin (ZOCOR) 20 MG tablet Take 20 mg by mouth every evening.   spironolactone (ALDACTONE) 25 MG tablet TAKE 1 TABLET (25 MG TOTAL) BY MOUTH DAILY. (Patient taking differently: Take 12.5 mg by mouth daily.)   torsemide (DEMADEX) 20 MG tablet TAKE 1 TABLET BY MOUTH EVERY DAY   valACYclovir (VALTREX) 500 MG tablet Take 500 mg by mouth 2 (two) times daily as needed (fever blisters).     Allergies:   Codeine, Demeclocycline, Demerol [meperidine], Hydrocodone, Oxycodone, Pentazocine, Tetracyclines & related, and Fentanyl   Social History   Socioeconomic History   Marital status: Married    Spouse name: Not on file   Number of children: Not on file   Years of education: Not on file   Highest education level: Not on file  Occupational History   Not on file  Tobacco Use   Smoking status: Never   Smokeless tobacco: Never  Vaping Use   Vaping Use: Never used  Substance and Sexual Activity   Alcohol use: No    Alcohol/week: 0.0 standard drinks   Drug use: No   Sexual activity: Not Currently    Birth control/protection: Surgical  Other Topics Concern   Not on file  Social History Narrative   Not on file   Social Determinants of Health   Financial Resource Strain: Not on file  Food Insecurity: Not on file  Transportation Needs: Not on file  Physical Activity: Not on file  Stress: Not on file  Social Connections: Not on file     Family History: The patient's family history includes Bladder Cancer in her brother; Breast cancer in her paternal grandmother; Colon cancer in her father and maternal uncle; Diabetes in her brother and sister; Heart disease in her brother; Leukemia in her mother; Prostate cancer in her brother and brother. There is no history of Ovarian cancer or Kidney  cancer.  ROS:   Please see the history of present illness.     All other systems reviewed and are negative.  EKGs/Labs/Other Studies Reviewed:    The following studies were reviewed today:  Heart monitor 03/2020 The patient was monitored for 14 days. The predominant rhythm was sinus with an average rate of 55 bpm (range 42-96 bpm in sinus). There were occasional PAC's and rare PVC's. 16 atrial runs lasting up to 12 beats occurred with a maximum rate of 164 bpm.  There was a single episode of nonsustained ventricular tachycardia lasting 6 beats with a maximum rate of 152 bpm. No sustained arrhythmia or prolonged pause was observed. Patient triggered events correspond to sinus rhythm, PAC's, and PVC's.   Predominantly sinus rhythm with occasional PAC's and rare PVC's, as well as rare brief episodes of PSVT and NSVT.   Echo 01/2020   1. Left ventricular ejection fraction, by estimation, is 50 to 55%. The  left ventricle has low normal function. Left ventricular endocardial  border not optimally defined to evaluate regional wall motion. There is  mild left ventricular hypertrophy. Left  ventricular diastolic parameters are indeterminate.   2. Right ventricular systolic function is mildly reduced. The right  ventricular size is normal. Mildly increased right ventricular wall  thickness. There is moderately elevated pulmonary artery systolic  pressure.   3. Left atrial size was moderately dilated.   4. Right atrial size was mildly dilated.   5. The pericardial effusion is posterior to the left ventricle.   6. The mitral valve is abnormal. Trivial mitral valve regurgitation.   7. Tricuspid valve regurgitation is mild to moderate.   8. The aortic valve is tricuspid. There is mild calcification of the  aortic valve. There is mild thickening of the aortic valve. Aortic valve  regurgitation is not visualized. Mild to moderate aortic valve  sclerosis/calcification is present, without any   evidence of aortic stenosis.   9. There is mild dilatation of the ascending aorta, measuring 36 mm.  10. The inferior vena cava is dilated in size with <50% resrespiratory  variability, suggesting right atrial pressure of 15 mmHg.   Cardiac CT 08/2020  IMPRESSION: 1. The left atrial appendage is a small chicken wing morphology without thrombus on delayed imaging.   2. Landing zone measurements support a 20 mm Watchman FLX (13% compression), but the LAA length is shallow (maximum length 7.6 mm) and does not appear it will support Watchman for LAAO. Recommend discussion with structural heart team.   3. There is contrast mixing artifact on contrast imaging, but no thrombus on delayed imaging.   4. A small PFO is present.   5. Normal coronary origin. Right dominance.   6. CAC score of 1351, which is 94th percentile for age-, sex-, and race-matched controls.   7. Dilated pulmonary artery, suggestive of pulmonary hypertension.   [place] T. O'Neal, MD    EKG:  EKG is ordered today.  The ekg ordered today demonstrates A paced V sensed rhythm, TWI inferior leads.   Recent Labs: 01/12/2021: ALT 15 03/14/2021: BUN 13; Creatinine, Ser 0.85; Hemoglobin 11.8; Platelets 74; Potassium 3.9; Sodium 138  Recent Lipid Panel    Component Value Date/Time   CHOL 160 11/30/2020 0944   TRIG 156 (H) 11/30/2020 0944   HDL 26 (L) 11/30/2020 0944   CHOLHDL 6.2 (H) 11/30/2020 0944   LDLCALC 106 (H) 11/30/2020 0944     Physical Exam:    VS:  BP (!) 110/58 (BP Location: Left Arm, Patient Position: Sitting, Cuff Size: Normal)    Pulse 60    Ht 5' 3"  (1.6 m)    Wt 156 lb 8 oz (71 kg)    LMP  (LMP Unknown)    SpO2 98%    BMI 27.72 kg/m     Wt Readings from Last 3 Encounters:  04/18/21 156 lb 8 oz (71 kg)  03/23/21 159 lb (72.1 kg)  03/14/21 160 lb (72.6 kg)     GEN:  Well nourished,  well developed in no acute distress HEENT: Normal NECK: No JVD; No carotid bruits LYMPHATICS: No  lymphadenopathy CARDIAC: RRR, no murmurs, rubs, gallops RESPIRATORY:  Clear to auscultation without rales, wheezing or rhonchi  ABDOMEN: Soft, non-tender, non-distended MUSCULOSKELETAL:  No edema; No deformity  SKIN: Warm and dry NEUROLOGIC:  Alert and oriented x 3 PSYCHIATRIC:  Normal affect   ASSESSMENT:    1. Persistent atrial fibrillation ([place])   2. S/P placement of cardiac pacemaker   3. Tachycardia-bradycardia syndrome ([place])   4. Chronic diastolic heart failure ([place])   5. NASH (nonalcoholic steatohepatitis)   6. Hepatic cirrhosis, unspecified hepatic cirrhosis type, unspecified whether ascites present ([place])    PLAN:    In order of problems listed above:  Persistent Afib Tachy-brady syndrome S/p pacemaker Patient was rapid in aflutter os symptomatic  in sinus bradycardia and saw EP. She is  now s/p PPM/ICD implant on 03/23/20. Device wound has healed nicely. EKG shows A-paced V sensed rhythm. Patient overall is feeling much better, she has more energy. She is not on a/c due to h/o GIB. Continue rate control with nadolol.    HFpEF She is euvolemic on exam. Continue Torsemide and spironolactone. Continue BB.   Cirrhosis and esophageal varices Follows with GI, plan for repeat CT scan on June  Disposition: Follow up in 4 month(s) with MD/APP    Signed, Mckynna Vanloan Ninfa Meeker, PA-C  04/18/2021 4:36 PM    [facility]

## 2021-04-18 NOTE — Patient Instructions (Signed)
Medication Instructions:  ? ?Your physician recommends that you continue on your current medications as directed. Please refer to the Current Medication list given to you today.  ? ?*If you need a refill on your cardiac medications before your next appointment, please call your pharmacy* ? ? ?Lab Work: ?None ordered ? ?If you have labs (blood work) drawn today and your tests are completely normal, you will receive your results only by: ?MyChart Message (if you have MyChart) OR ?A paper copy in the mail ?If you have any lab test that is abnormal or we need to change your treatment, we will call you to review the results. ? ? ?Testing/Procedures: ?None ordered ? ? ?Follow-Up: ?At [facility], you and your health needs are our priority.  As part of our continuing mission to provide you with exceptional heart care, we have created designated Provider Care Teams.  These Care Teams include your primary Cardiologist (physician) and Advanced Practice Providers (APPs -  Physician Assistants and Nurse Practitioners) who all work together to provide you with the care you need, when you need it. ? ?We recommend signing up for the patient portal called "MyChart".  Sign up information is provided on this After Visit Summary.  MyChart is used to connect with patients for Virtual Visits (Telemedicine).  Patients are able to view lab/test results, encounter notes, upcoming appointments, etc.  Non-urgent messages can be sent to your provider as well.   ?To learn more about what you can do with MyChart, go to [email or web address].   ? ?Your next appointment:   ?4 month(s) ? ?The format for your next appointment:   ?In Person ? ?Provider:   ?You may see Nelva Bush, MD or one of the following Advanced Practice Providers on your designated Care Team:   ?Murray Hodgkins, NP ?Christell Faith, PA-C ?Cadence Kathlen Mody, PA-C ? ? ?Other Instructions ?N/A ?

## 2021-04-26 ENCOUNTER — Other Ambulatory Visit: Payer: Self-pay

## 2021-04-26 ENCOUNTER — Other Ambulatory Visit: Payer: Medicare PPO

## 2021-04-26 ENCOUNTER — Other Ambulatory Visit (INDEPENDENT_AMBULATORY_CARE_PROVIDER_SITE_OTHER): Payer: Medicare PPO

## 2021-04-26 DIAGNOSIS — I4589 Other specified conduction disorders: Secondary | ICD-10-CM | POA: Diagnosis not present

## 2021-04-26 NOTE — Addendum Note (Signed)
Addended by: Raelene Bott, Miniya Miguez L on: 04/26/2021 02:36 PM ? ? Modules accepted: Orders ? ?

## 2021-04-26 NOTE — Addendum Note (Signed)
Addended by: Raelene Bott, Costantino Kohlbeck L on: 04/26/2021 01:44 PM ? ? Modules accepted: Orders ? ?

## 2021-04-27 LAB — BASIC METABOLIC PANEL
BUN/Creatinine Ratio: 22 (ref 12–28)
BUN: 19 mg/dL (ref 8–27)
CO2: 26 mmol/L (ref 20–29)
Calcium: 9.3 mg/dL (ref 8.7–10.3)
Chloride: 97 mmol/L (ref 96–106)
Creatinine, Ser: 0.86 mg/dL (ref 0.57–1.00)
Glucose: 187 mg/dL — ABNORMAL HIGH (ref 70–99)
Potassium: 4 mmol/L (ref 3.5–5.2)
Sodium: 137 mmol/L (ref 134–144)
eGFR: 68 mL/min/{1.73_m2} (ref >59)

## 2021-04-27 LAB — CBC WITH DIFFERENTIAL/PLATELET
Basophils Absolute: 0 10*3/uL (ref 0.0–0.2)
Basos: 1 %
EOS (ABSOLUTE): 0.1 10*3/uL (ref 0.0–0.4)
Eos: 3 %
Hematocrit: 34.3 % (ref 34.0–46.6)
Hemoglobin: 11.9 g/dL (ref 11.1–15.9)
Immature Grans (Abs): 0 10*3/uL (ref 0.0–0.1)
Immature Granulocytes: 0 %
Lymphocytes Absolute: 0.8 10*3/uL (ref 0.7–3.1)
Lymphs: 35 %
MCH: 30.5 pg (ref 26.6–33.0)
MCHC: 34.7 g/dL (ref 31.5–35.7)
MCV: 88 fL (ref 79–97)
Monocytes Absolute: 0.3 10*3/uL (ref 0.1–0.9)
Monocytes: 12 %
Neutrophils Absolute: 1.2 10*3/uL — ABNORMAL LOW (ref 1.4–7.0)
Neutrophils: 49 %
Platelets: 75 10*3/uL — CL (ref 150–450)
RBC: 3.9 x10E6/uL (ref 3.77–5.28)
RDW: 14.2 % (ref 11.7–15.4)
WBC: 2.4 10*3/uL — CL (ref 3.4–10.8)

## 2021-04-30 ENCOUNTER — Telehealth: Payer: Self-pay

## 2021-04-30 NOTE — Telephone Encounter (Signed)
[facility] alert received for AF w/ RVR (unknown duration). Has AV Node Ablation 05/17/2021 scheduled. Patient called and reports fatigue. Patient checked her heart rate ~ 70 bpm this am. Patient advised to continue to check heart rate throughout the day and monitor her symptoms. Alert reset for tomorrow 3/21 to review. Patient aware to call with further questions or concerns.  ? ? ? ?

## 2021-05-01 ENCOUNTER — Telehealth: Payer: Self-pay

## 2021-05-01 NOTE — Telephone Encounter (Signed)
Open in error

## 2021-05-10 ENCOUNTER — Encounter: Payer: Self-pay | Admitting: Podiatry

## 2021-05-10 ENCOUNTER — Ambulatory Visit (INDEPENDENT_AMBULATORY_CARE_PROVIDER_SITE_OTHER): Payer: Medicare PPO | Admitting: Podiatry

## 2021-05-10 DIAGNOSIS — B351 Tinea unguium: Secondary | ICD-10-CM

## 2021-05-10 DIAGNOSIS — E1142 Type 2 diabetes mellitus with diabetic polyneuropathy: Secondary | ICD-10-CM | POA: Diagnosis not present

## 2021-05-10 DIAGNOSIS — M2041 Other hammer toe(s) (acquired), right foot: Secondary | ICD-10-CM | POA: Diagnosis not present

## 2021-05-10 DIAGNOSIS — M2042 Other hammer toe(s) (acquired), left foot: Secondary | ICD-10-CM

## 2021-05-10 DIAGNOSIS — M2010 Hallux valgus (acquired), unspecified foot: Secondary | ICD-10-CM | POA: Diagnosis not present

## 2021-05-10 DIAGNOSIS — M79675 Pain in left toe(s): Secondary | ICD-10-CM

## 2021-05-10 DIAGNOSIS — M79674 Pain in right toe(s): Secondary | ICD-10-CM

## 2021-05-10 NOTE — Progress Notes (Signed)
This patient returns to my office for at risk foot care.  This patient requires this care by a professional since this patient will be at risk due to having diabetes  This patient is unable to cut nails himself since the patient cannot reach his nails.These nails are painful walking and wearing shoes.  This patient presents for at risk foot care today.   ? ?General Appearance  Alert, conversant and in no acute stress. ? ?Vascular  Dorsalis pedis and posterior tibial  pulses are palpable  bilaterally.  Capillary return is within normal limits  bilaterally. Temperature is within normal limits  bilaterally. ? ?Neurologic  Senn-Weinstein monofilament test dimininished  bilaterally. Muscle power within normal limits bilaterally. ? ?Nails Thick disfigured discolored nails with subungual debris  from hallux to fifth toes bilaterally. No evidence of bacterial infection or drainage bilaterally. ? ?Orthopedic  No limitations of motion  feet .  No crepitus or effusions noted.  No bony pathology or digital deformities noted. HAV  B/L.  Hammer toes  B/L.  Midfoot DJD  B/L. ? ?Skin  normotropic skin with no porokeratosis noted bilaterally.  No signs of infections or ulcers noted.    ? ?Onychomycosis  Pain in right toes  Pain in left toes ? ?Consent was obtained for treatment procedures.   Mechanical debridement of nails 1-5  bilaterally performed with a nail nipper.  Filed with dremel without incident.   ? ? ?Return office visit    3 months                Told patient to return for periodic foot care and evaluation due to potential at risk complications. ? ? ?Gardiner Barefoot DPM  ?

## 2021-05-14 ENCOUNTER — Inpatient Hospital Stay: Payer: Medicare PPO | Attending: Nurse Practitioner

## 2021-05-14 ENCOUNTER — Encounter: Payer: Self-pay | Admitting: Nurse Practitioner

## 2021-05-14 ENCOUNTER — Inpatient Hospital Stay (HOSPITAL_BASED_OUTPATIENT_CLINIC_OR_DEPARTMENT_OTHER): Payer: Medicare PPO | Admitting: Nurse Practitioner

## 2021-05-14 ENCOUNTER — Inpatient Hospital Stay: Payer: Medicare PPO

## 2021-05-14 VITALS — BP 115/60 | HR 60 | Temp 96.6°F | Resp 16 | Ht 63.0 in | Wt 158.7 lb

## 2021-05-14 VITALS — BP 105/71 | HR 60 | Resp 18

## 2021-05-14 DIAGNOSIS — I480 Paroxysmal atrial fibrillation: Secondary | ICD-10-CM | POA: Insufficient documentation

## 2021-05-14 DIAGNOSIS — K7581 Nonalcoholic steatohepatitis (NASH): Secondary | ICD-10-CM | POA: Insufficient documentation

## 2021-05-14 DIAGNOSIS — Z86000 Personal history of in-situ neoplasm of breast: Secondary | ICD-10-CM | POA: Diagnosis not present

## 2021-05-14 DIAGNOSIS — K7460 Unspecified cirrhosis of liver: Secondary | ICD-10-CM

## 2021-05-14 DIAGNOSIS — R001 Bradycardia, unspecified: Secondary | ICD-10-CM | POA: Insufficient documentation

## 2021-05-14 DIAGNOSIS — D6959 Other secondary thrombocytopenia: Secondary | ICD-10-CM | POA: Diagnosis not present

## 2021-05-14 DIAGNOSIS — K766 Portal hypertension: Secondary | ICD-10-CM | POA: Diagnosis not present

## 2021-05-14 DIAGNOSIS — Z794 Long term (current) use of insulin: Secondary | ICD-10-CM | POA: Insufficient documentation

## 2021-05-14 DIAGNOSIS — E119 Type 2 diabetes mellitus without complications: Secondary | ICD-10-CM | POA: Diagnosis not present

## 2021-05-14 DIAGNOSIS — E611 Iron deficiency: Secondary | ICD-10-CM

## 2021-05-14 DIAGNOSIS — E039 Hypothyroidism, unspecified: Secondary | ICD-10-CM | POA: Insufficient documentation

## 2021-05-14 DIAGNOSIS — D500 Iron deficiency anemia secondary to blood loss (chronic): Secondary | ICD-10-CM | POA: Insufficient documentation

## 2021-05-14 DIAGNOSIS — D61818 Other pancytopenia: Secondary | ICD-10-CM

## 2021-05-14 DIAGNOSIS — K219 Gastro-esophageal reflux disease without esophagitis: Secondary | ICD-10-CM | POA: Diagnosis not present

## 2021-05-14 DIAGNOSIS — I110 Hypertensive heart disease with heart failure: Secondary | ICD-10-CM | POA: Diagnosis not present

## 2021-05-14 DIAGNOSIS — Z79899 Other long term (current) drug therapy: Secondary | ICD-10-CM | POA: Insufficient documentation

## 2021-05-14 DIAGNOSIS — I5032 Chronic diastolic (congestive) heart failure: Secondary | ICD-10-CM | POA: Diagnosis not present

## 2021-05-14 DIAGNOSIS — K922 Gastrointestinal hemorrhage, unspecified: Secondary | ICD-10-CM | POA: Insufficient documentation

## 2021-05-14 LAB — COMPREHENSIVE METABOLIC PANEL
ALT: 15 U/L (ref 0–44)
AST: 26 U/L (ref 15–41)
Albumin: 3.8 g/dL (ref 3.5–5.0)
Alkaline Phosphatase: 49 U/L (ref 38–126)
Anion gap: 7 (ref 5–15)
BUN: 18 mg/dL (ref 8–23)
CO2: 29 mmol/L (ref 22–32)
Calcium: 8.8 mg/dL — ABNORMAL LOW (ref 8.9–10.3)
Chloride: 96 mmol/L — ABNORMAL LOW (ref 98–111)
Creatinine, Ser: 0.87 mg/dL (ref 0.44–1.00)
GFR, Estimated: >60 mL/min (ref >60)
Glucose, Bld: 155 mg/dL — ABNORMAL HIGH (ref 70–99)
Potassium: 3.8 mmol/L (ref 3.5–5.1)
Sodium: 132 mmol/L — ABNORMAL LOW (ref 135–145)
Total Bilirubin: 1.4 mg/dL — ABNORMAL HIGH (ref 0.3–1.2)
Total Protein: 7.2 g/dL (ref 6.5–8.1)

## 2021-05-14 LAB — CBC WITH DIFFERENTIAL/PLATELET
Abs Immature Granulocytes: 0.01 10*3/uL (ref 0.00–0.07)
Basophils Absolute: 0 10*3/uL (ref 0.0–0.1)
Basophils Relative: 1 %
Eosinophils Absolute: 0.1 10*3/uL (ref 0.0–0.5)
Eosinophils Relative: 4 %
HCT: 34.4 % — ABNORMAL LOW (ref 36.0–46.0)
Hemoglobin: 11.5 g/dL — ABNORMAL LOW (ref 12.0–15.0)
Immature Granulocytes: 1 %
Lymphocytes Relative: 35 %
Lymphs Abs: 0.7 10*3/uL (ref 0.7–4.0)
MCH: 30.7 pg (ref 26.0–34.0)
MCHC: 33.4 g/dL (ref 30.0–36.0)
MCV: 92 fL (ref 80.0–100.0)
Monocytes Absolute: 0.2 10*3/uL (ref 0.1–1.0)
Monocytes Relative: 11 %
Neutro Abs: 1 10*3/uL — ABNORMAL LOW (ref 1.7–7.7)
Neutrophils Relative %: 48 %
Platelets: 80 10*3/uL — ABNORMAL LOW (ref 150–400)
RBC: 3.74 MIL/uL — ABNORMAL LOW (ref 3.87–5.11)
RDW: 14.6 % (ref 11.5–15.5)
WBC: 2 10*3/uL — ABNORMAL LOW (ref 4.0–10.5)
nRBC: 0 % (ref 0.0–0.2)

## 2021-05-14 MED ORDER — IRON SUCROSE 20 MG/ML IV SOLN
200.0000 mg | Freq: Once | INTRAVENOUS | Status: AC
  Administered 2021-05-14: 200 mg via INTRAVENOUS
  Filled 2021-05-14: qty 10

## 2021-05-14 MED ORDER — SODIUM CHLORIDE 0.9 % IV SOLN
Freq: Once | INTRAVENOUS | Status: AC
  Filled 2021-05-14: qty 250

## 2021-05-14 NOTE — Patient Instructions (Signed)
[facility] CANCER CTR AT [place]  Discharge Instructions: ?Thank you for choosing [facility] to provide your oncology and hematology care.  ?If you have a lab appointment with the [facility], please go directly to the [facility] and check in at the registration area. ? ?Wear comfortable clothing and clothing appropriate for easy access to any Portacath or PICC line.  ? ?We strive to give you quality time with your provider. You may need to reschedule your appointment if you arrive late (15 or more minutes).  Arriving late affects you and other patients whose appointments are after yours.  Also, if you miss three or more appointments without notifying the office, you may be dismissed from the clinic at the provider?s discretion.    ?  ?For prescription refill requests, have your pharmacy contact our office and allow 72 hours for refills to be completed.   ? ?Today you received the following chemotherapy and/or immunotherapy agents VENOFER    ?  ?To help prevent nausea and vomiting after your treatment, we encourage you to take your nausea medication as directed. ? ?BELOW ARE SYMPTOMS THAT SHOULD BE REPORTED IMMEDIATELY: ?*FEVER GREATER THAN 100.4 F (38 ?C) OR HIGHER ?*CHILLS OR SWEATING ?*NAUSEA AND VOMITING THAT IS NOT CONTROLLED WITH YOUR NAUSEA MEDICATION ?*UNUSUAL SHORTNESS OF BREATH ?*UNUSUAL BRUISING OR BLEEDING ?*URINARY PROBLEMS (pain or burning when urinating, or frequent urination) ?*BOWEL PROBLEMS (unusual diarrhea, constipation, pain near the anus) ?TENDERNESS IN MOUTH AND THROAT WITH OR WITHOUT PRESENCE OF ULCERS (sore throat, sores in mouth, or a toothache) ?UNUSUAL RASH, SWELLING OR PAIN  ?UNUSUAL VAGINAL DISCHARGE OR ITCHING  ? ?Items with * indicate a potential emergency and should be followed up as soon as possible or go to the Emergency Department if any problems should occur. ? ?Please show the CHEMOTHERAPY ALERT CARD or IMMUNOTHERAPY ALERT CARD at check-in to the  Emergency Department and triage nurse. ? ?Should you have questions after your visit or need to cancel or reschedule your appointment, please contact [facility] CANCER [place] AT [place]  [phone] and follow the prompts.  Office hours are 8:00 a.m. to 4:30 p.m. Monday - Friday. Please note that voicemails left after 4:00 p.m. may not be returned until the following business day.  We are closed weekends and major holidays. You have access to a nurse at all times for urgent questions. Please call the main number to the clinic [phone] and follow the prompts. ? ?For any non-urgent questions, you may also contact your provider using MyChart. We now offer e-Visits for anyone 51 and older to request care online for non-urgent symptoms. For details visit [email or web address]. ?  ?Also download the MyChart app! Go to the app store, search "MyChart", open the app, select [place], and log in with your MyChart username and password. ? ?Due to Covid, a mask is required upon entering the hospital/clinic. If you do not have a mask, one will be given to you upon arrival. For doctor visits, patients may have 1 support person aged 47 or older with them. For treatment visits, patients cannot have anyone with them due to current Covid guidelines and our immunocompromised population.  ? ?Iron Sucrose Injection ?What is this medication? ?IRON SUCROSE (EYE ern SOO krose) treats low levels of iron (iron deficiency anemia) in people with kidney disease. Iron is a mineral that plays an important role in making red blood cells, which carry oxygen from your lungs to the rest of your body. ?This medicine may  be used for other purposes; ask your health care provider or pharmacist if you have questions. ?COMMON BRAND NAME(S): Venofer ?What should I tell my care team before I take this medication? ?They need to know if you have any of these conditions: ?Anemia not caused by low iron levels ?Heart disease ?High levels of  iron in the blood ?Kidney disease ?Liver disease ?An unusual or allergic reaction to iron, other medications, foods, dyes, or preservatives ?Pregnant or trying to get pregnant ?Breast-feeding ?How should I use this medication? ?This medication is for infusion into a vein. It is given in a hospital or clinic setting. ?Talk to your care team about the use of this medication in children. While this medication may be prescribed for children as young as 2 years for selected conditions, precautions do apply. ?Overdosage: If you think you have taken too much of this medicine contact a poison control center or emergency room at once. ?NOTE: This medicine is only for you. Do not share this medicine with others. ?What if I miss a dose? ?It is important not to miss your dose. Call your care team if you are unable to keep an appointment. ?What may interact with this medication? ?Do not take this medication with any of the following: ?Deferoxamine ?Dimercaprol ?Other iron products ?This medication may also interact with the following: ?Chloramphenicol ?Deferasirox ?This list may not describe all possible interactions. Give your health care provider a list of all the medicines, herbs, non-prescription drugs, or dietary supplements you use. Also tell them if you smoke, drink alcohol, or use illegal drugs. Some items may interact with your medicine. ?What should I watch for while using this medication? ?Visit your care team regularly. Tell your care team if your symptoms do not start to get better or if they get worse. You may need blood work done while you are taking this medication. ?You may need to follow a special diet. Talk to your care team. Foods that contain iron include: whole grains/cereals, dried fruits, beans, or peas, leafy green vegetables, and organ meats (liver, kidney). ?What side effects may I notice from receiving this medication? ?Side effects that you should report to your care team as soon as  possible: ?Allergic reactions--skin rash, itching, hives, swelling of the face, lips, tongue, or throat ?Low blood pressure--dizziness, feeling faint or lightheaded, blurry vision ?Shortness of breath ?Side effects that usually do not require medical attention (report to your care team if they continue or are bothersome): ?Flushing ?Headache ?Joint pain ?Muscle pain ?Nausea ?Pain, redness, or irritation at injection site ?This list may not describe all possible side effects. Call your doctor for medical advice about side effects. You may report side effects to FDA at 1-800-FDA-1088. ?Where should I keep my medication? ?This medication is given in a hospital or clinic and will not be stored at home. ?NOTE: This sheet is a summary. It may not cover all possible information. If you have questions about this medicine, talk to your doctor, pharmacist, or health care provider. ?? 2022 Elsevier/Gold Standard (2020-06-23 00:00:00) ? ?

## 2021-05-14 NOTE — Progress Notes (Signed)
Annette Foster ?OFFICE PROGRESS NOTE ? ?Patient Care Team: ?Albina Billet, MD as PCP - General (Internal Medicine) ?Nelva Bush, MD as PCP - Cardiology (Cardiology) ?Vickie Epley, MD as PCP - Electrophysiology (Cardiology) ? ? Cancer Staging  ?No matching staging information was found for the patient. ? ? ?Oncology History Overview Note  ?# AUG 2017- DCIS LEFT BREAST ER/PR- POS; positive margins [Dr.Smith]; s/p re-excision; s/p RT [finish RT Nov 10th 2017]; START ARIMIDEX- Jan 2018; Stopped in July 2018- sec to muscle cramps; Sep 2018- start Letrozole.;  JAN 2019-STOPPED Letrozole sec night sweats/poor tolerance ? ?# Mild pancytopenia [CTApril 2019-cirrhosis/spleneomegaly]. OCT 2018- BMBx- MILD dyspoiesis; variable cellularity [10-50%]; FISH/Cytogenetics-NORMAL; F-One-NGS-declined by insurance.   ? ?# cirrhosis- ? Etiology/NASH [Dr.Elliot] ? ?#Frequent UTIs [2019-2020; Dr. Brandon/uro-gyne,[facility]]; Dr.Wohl/ ? ?# HRT [clinical trial thru [facility]; stopped July 2017 ]; CPAP  ? ?DIAGNOSIS: Left breast DCIS ? ?STAGE:    0     ;GOALS: cure ? ?CURRENT/MOST RECENT THERAPY: surveillaince ? ?  ?Ductal carcinoma in situ (DCIS) of left breast  ? ? ?INTERVAL HISTORY: Annette Foster 81 y.o. female pleasant patient above history of DCIS; cirrhosis portal hypertension; A. fib and pancytopenia is here for follow-up. In interim, she has had pacemaker placed. Feels well. Denies complaints. Denies any swelling in the legs.  Denies any shortness of breath or cough.  Complains of tiredness.  Denies any ongoing bleeding. Received IV iron December 2022.  ? ? ?Review of Systems  ?Constitutional:  Positive for malaise/fatigue. Negative for chills, diaphoresis, fever and weight loss.  ?HENT:  Negative for nosebleeds and sore throat.   ?Eyes:  Negative for double vision.  ?Respiratory:  Negative for cough, hemoptysis, sputum production, shortness of breath and wheezing.   ?Cardiovascular:  Negative for chest pain,  palpitations, orthopnea and leg swelling.  ?Gastrointestinal:  Negative for abdominal pain, blood in stool, constipation, diarrhea, heartburn, melena, nausea and vomiting.  ?Musculoskeletal:  Positive for back pain and joint pain.  ?Skin: Negative.  Negative for itching and rash.  ?Neurological:  Negative for dizziness, tingling, focal weakness, weakness and headaches.  ?Endo/Heme/Allergies:  Does not bruise/bleed easily.  ?Psychiatric/Behavioral:  Negative for depression. The patient is not nervous/anxious and does not have insomnia.   ?  ? ?PAST MEDICAL HISTORY :  ?Past Medical History:  ?Diagnosis Date  ? Abdominal pain   ? Allergy   ? Cancer [facility]) 2017  ? breast cancer- Left  ? Cataract   ? CHF (congestive heart failure) ([place])   ? Collagenous colitis   ? Diabetes mellitus without complication ([place])   ? Diarrhea   ? Diverticulosis   ? Fatty liver disease, nonalcoholic   ? Fibrocystic breast   ? GERD (gastroesophageal reflux disease)   ? Heart murmur   ? Hyperlipidemia   ? Hypertension   ? Hypothyroidism   ? IBS (irritable bowel syndrome)   ? IDA (iron deficiency anemia)   ? Personal history of radiation therapy 2017  ? LEFT BREAST CA  ? PONV (postoperative nausea and vomiting)   ? Sleep apnea   ? C-Pap  ? ? ?PAST SURGICAL HISTORY :   ?Past Surgical History:  ?Procedure Laterality Date  ? ABDOMINAL HYSTERECTOMY    ? tah bso  ? ABDOMINAL SURGERY    ? APPENDECTOMY    ? BREAST BIOPSY Bilateral 2016  ? negative  ? BREAST 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/2015  ? DCIS, papillary carcinoma in situ  ? BREAST BIOPSY Left 05/27/2016  ? BENIGN  MAMMARY EPITHELIUM  ? BREAST BIOPSY Left 11/20/2017  ? affirm bx x clip BENIGN MAMMARY EPITHELIUM CONSISTENT WITH RAD THERAPY  ? BREAST EXCISIONAL BIOPSY Right   ? NEG 1980's  ? BREAST LUMPECTOMY Left 10/17/2015  ? DCIS and papillary carcinoma insitu, clear margins  ? CARDIAC SURGERY    ? has replacement valve  ? CATARACT EXTRACTION Right   ? CATARACT EXTRACTION W/PHACO Left 01/16/2021  ? Procedure:  CATARACT EXTRACTION PHACO AND INTRAOCULAR LENS PLACEMENT (IOC) LEFT DIABETIC 8.46 00:59.8;  Surgeon: Birder Robson, MD;  Location: [facility];  Service: Ophthalmology;  Laterality: Left;  ? CHOLECYSTECTOMY    ? COLONOSCOPY WITH PROPOFOL N/A 03/11/2016  ? Procedure: COLONOSCOPY WITH PROPOFOL;  Surgeon: Manya Silvas, MD;  Location: [facility] ENDOSCOPY;  Service: Endoscopy;  Laterality: N/A;  ? COLONOSCOPY WITH PROPOFOL N/A 02/18/2020  ? Procedure: COLONOSCOPY WITH PROPOFOL;  Surgeon: [name], MD;  Location: [facility] ENDOSCOPY;  Service: Endoscopy;  Laterality: N/A;  ? ESOPHAGOGASTRODUODENOSCOPY (EGD) WITH PROPOFOL N/A 03/11/2016  ? Procedure: ESOPHAGOGASTRODUODENOSCOPY (EGD) WITH PROPOFOL;  Surgeon: Manya Silvas, MD;  Location: [facility] ENDOSCOPY;  Service: Endoscopy;  Laterality: N/A;  ? ESOPHAGOGASTRODUODENOSCOPY (EGD) WITH PROPOFOL N/A 02/18/2020  ? Procedure: ESOPHAGOGASTRODUODENOSCOPY (EGD) WITH PROPOFOL;  Surgeon: [name], MD;  Location: [facility] ENDOSCOPY;  Service: Endoscopy;  Laterality: N/A;  ? ESOPHAGOGASTRODUODENOSCOPY (EGD) WITH PROPOFOL N/A 04/25/2020  ? Procedure: ESOPHAGOGASTRODUODENOSCOPY (EGD) WITH PROPOFOL;  Surgeon: [name], MD;  Location: [facility] ENDOSCOPY;  Service: Endoscopy;  Laterality: N/A;  ? ESOPHAGOGASTRODUODENOSCOPY (EGD) WITH PROPOFOL N/A 05/23/2020  ? Procedure: ESOPHAGOGASTRODUODENOSCOPY (EGD) WITH PROPOFOL;  Surgeon: [name], MD;  Location: [facility] ENDOSCOPY;  Service: Endoscopy;  Laterality: N/A;  ? FINGER ARTHROSCOPY WITH CARPOMETACARPEL ([place]) ARTHROPLASTY Right 09/03/2018  ? Procedure: CARPOMETACARPEL [facility]) ARTHROPLASTY RIGHT THUMB;  Surgeon: Hessie Knows, MD;  Location: [facility] ORS;  Service: Orthopedics;  Laterality: Right;  ? GANGLION CYST EXCISION Right 09/03/2018  ? Procedure: REMOVAL GANGLION OF WRIST;  Surgeon: Hessie Knows, MD;  Location: [facility] ORS;  Service: Orthopedics;  Laterality: Right;  ? HARDWARE REMOVAL Right 09/03/2018  ? Procedure: HARDWARE REMOVAL  RIGHT THUMB;  Surgeon: Hessie Knows, MD;  Location: [facility] ORS;  Service: Orthopedics;  Laterality: Right;  staple removed  ? JOINT REPLACEMENT Left   ? TKR  ? left sinusplasty     ? MASTECTOMY, PARTIAL Left 10/17/2015  ? Procedure: MASTECTOMY PARTIAL REVISION;  Surgeon: Leonie Green, MD;  Location: [facility] ORS;  Service: General;  Laterality: Left;  ? PACEMAKER IMPLANT N/A 03/23/2021  ? Procedure: PACEMAKER IMPLANT;  Surgeon: Vickie Epley, MD;  Location: [facility] CV LAB;  Service: Cardiovascular;  Laterality: N/A;  ? PARTIAL MASTECTOMY WITH NEEDLE LOCALIZATION Left 09/29/2015  ? Procedure: PARTIAL MASTECTOMY WITH NEEDLE LOCALIZATION;  Surgeon: Leonie Green, MD;  Location: [facility] ORS;  Service: General;  Laterality: Left;  ? TOTAL ABDOMINAL HYSTERECTOMY W/ BILATERAL SALPINGOOPHORECTOMY    ? ? ?FAMILY HISTORY :   ?Family History  ?Problem Relation Age of Onset  ? Breast cancer Paternal Grandmother   ? Colon cancer Father   ? Diabetes Sister   ? Diabetes Brother   ? Heart disease Brother   ? Prostate cancer Brother   ? Colon cancer Maternal Uncle   ? Prostate cancer Brother   ? Bladder Cancer Brother   ? Leukemia Mother   ?     all  ? Ovarian cancer Neg Hx   ? Kidney cancer Neg Hx   ? ? ?SOCIAL HISTORY:   ?Social  History  ? ?Tobacco Use  ? Smoking status: Never  ? Smokeless tobacco: Never  ?Vaping Use  ? Vaping Use: Never used  ?Substance Use Topics  ? Alcohol use: No  ?  Alcohol/week: 0.0 standard drinks  ? Drug use: No  ? ? ?ALLERGIES:  is allergic to codeine, demeclocycline, demerol [meperidine], hydrocodone, oxycodone, pentazocine, tetracyclines & related, and fentanyl. ? ?MEDICATIONS:  ?Current Outpatient Medications  ?Medication Sig Dispense Refill  ? acetaminophen (TYLENOL) 325 MG tablet Take 1-2 tablets (325-650 mg total) by mouth every 4 (four) hours as needed for mild pain.    ? azelastine (OPTIVAR) 0.05 % ophthalmic solution daily as needed.    ? benzonatate (TESSALON) 200 MG capsule Take  200 mg by mouth 3 (three) times daily as needed for cough.    ? Cholecalciferol (VITAMIN D) 50 MCG (2000 UT) CAPS Take 2,000 Units by mouth daily.    ? clobetasol ointment (TEMOVATE) 0.44 % Apply 1 application

## 2021-05-14 NOTE — Addendum Note (Signed)
Addended by: Delice Bison E on: 05/14/2021 02:04 PM ? ? Modules accepted: Orders ? ?

## 2021-05-14 NOTE — Progress Notes (Signed)
Pt states she will have an ablation done on Thursday and had pacemaker placed in February d/t afib. Pt reports right sided back pain x 1 year and has seen PCP for same. ?

## 2021-05-15 ENCOUNTER — Telehealth: Payer: Self-pay | Admitting: Cardiology

## 2021-05-15 LAB — AFP TUMOR MARKER: AFP, Serum, Tumor Marker: 2.5 ng/mL (ref 0.0–9.2)

## 2021-05-15 NOTE — Pre-Procedure Instructions (Addendum)
Instructed patient on the following items: ?Arrival time 1030 ?Nothing to eat or drink after midnight ?No meds AM of procedure ?Responsible person to drive you home and stay with you for 24 hrs ? ?Patient asked if she needed to come in early to receive platelets like she did for her pacemaker.  Awaiting to hear back from Dr Quentin Ore.  Will let patient know once hearing back from MD. ?   ? ?05/15/21 Spoke with Dr Quentin Ore, he wants patient to received 6 units of platelets in preparation for ablation.  I have notified patient to arrive by 9am or sooner if possible. ?

## 2021-05-15 NOTE — Telephone Encounter (Signed)
Reviewed the patient's chart.  ? ?PPM implanted on 03/23/21. ? ?03/14/21- Platelet count was 74.  ?Per the patient's message below, she required platelets during admission for her PPM. ? ?04/26/21- Platelet count 75 ? ?05/14/21- Platelet count 80 ? ?Will need to forward to Dr. Quentin Ore to review and advise, but will try to reach out to him by phone as well to discuss.  ? ? ?

## 2021-05-15 NOTE — Telephone Encounter (Signed)
Patient following up. States she needs to know something by the end of the day because her daughter will be getting her grandchildren on the bus the morning of her ablation but if she needs to come earlier to be given platelets she will have to have someone else put the grandchildren on the bus.  ?

## 2021-05-15 NOTE — Telephone Encounter (Signed)
Messaged Dr. Quentin Ore regarding the information below. ? ?Per Dr. Quentin Ore, he was aware of the low platelet count and had discussed the patient with Rennis Harding in the cath lab. ?She was going to arrange for the patient to receive a platelet infusion the day of her procedure. ? ?I reached out to [facility] and confirmed she has contacted the patient and advised her to arrive at [facility] by 9:00 am on Thursday. ? ?I also spoke with the patient to confirm she has no other questions at this time. ?She advised she had spoken with Anderson Malta and had no other questions/ concerns now. ? ?She will call the office back if needed with further questions/ concerns. ? ?She was very appreciative of the call back. ?

## 2021-05-15 NOTE — Telephone Encounter (Signed)
Patient states she has an ablation scheduled for Thursday an went to her oncologist. She says they advised she call, because her platelets are low. She says they were low when she got her pacemaker put in and ended up needing to be given platelets.  ?

## 2021-05-17 ENCOUNTER — Other Ambulatory Visit: Payer: Self-pay

## 2021-05-17 ENCOUNTER — Encounter (HOSPITAL_COMMUNITY)
Admission: RE | Disposition: A | Discharge status: Home / Self Care | Payer: Medicare PPO | Source: Home / Self Care | Attending: Cardiology

## 2021-05-17 ENCOUNTER — Ambulatory Visit (HOSPITAL_COMMUNITY): Payer: Medicare PPO | Admitting: Certified Registered Nurse Anesthetist

## 2021-05-17 ENCOUNTER — Ambulatory Visit (HOSPITAL_COMMUNITY)
Admission: RE | Admit: 2021-05-17 | Discharge: 2021-05-18 | Disposition: A | Discharge status: Home / Self Care | Payer: Medicare PPO | Attending: Cardiology | Admitting: Cardiology

## 2021-05-17 ENCOUNTER — Ambulatory Visit (HOSPITAL_BASED_OUTPATIENT_CLINIC_OR_DEPARTMENT_OTHER): Payer: Medicare PPO | Admitting: Certified Registered Nurse Anesthetist

## 2021-05-17 ENCOUNTER — Encounter (HOSPITAL_COMMUNITY): Payer: Self-pay | Admitting: Cardiology

## 2021-05-17 DIAGNOSIS — I5032 Chronic diastolic (congestive) heart failure: Secondary | ICD-10-CM

## 2021-05-17 DIAGNOSIS — Z79890 Hormone replacement therapy (postmenopausal): Secondary | ICD-10-CM | POA: Insufficient documentation

## 2021-05-17 DIAGNOSIS — E039 Hypothyroidism, unspecified: Secondary | ICD-10-CM | POA: Diagnosis not present

## 2021-05-17 DIAGNOSIS — Z794 Long term (current) use of insulin: Secondary | ICD-10-CM | POA: Diagnosis not present

## 2021-05-17 DIAGNOSIS — I110 Hypertensive heart disease with heart failure: Secondary | ICD-10-CM | POA: Diagnosis not present

## 2021-05-17 DIAGNOSIS — G4730 Sleep apnea, unspecified: Secondary | ICD-10-CM | POA: Insufficient documentation

## 2021-05-17 DIAGNOSIS — I4819 Other persistent atrial fibrillation: Secondary | ICD-10-CM

## 2021-05-17 DIAGNOSIS — Z79899 Other long term (current) drug therapy: Secondary | ICD-10-CM | POA: Diagnosis not present

## 2021-05-17 DIAGNOSIS — I4892 Unspecified atrial flutter: Secondary | ICD-10-CM | POA: Insufficient documentation

## 2021-05-17 DIAGNOSIS — I4891 Unspecified atrial fibrillation: Secondary | ICD-10-CM | POA: Diagnosis present

## 2021-05-17 DIAGNOSIS — E785 Hyperlipidemia, unspecified: Secondary | ICD-10-CM | POA: Insufficient documentation

## 2021-05-17 DIAGNOSIS — I4589 Other specified conduction disorders: Secondary | ICD-10-CM

## 2021-05-17 DIAGNOSIS — I8500 Esophageal varices without bleeding: Secondary | ICD-10-CM | POA: Insufficient documentation

## 2021-05-17 DIAGNOSIS — Z853 Personal history of malignant neoplasm of breast: Secondary | ICD-10-CM | POA: Diagnosis not present

## 2021-05-17 DIAGNOSIS — Z950 Presence of cardiac pacemaker: Secondary | ICD-10-CM | POA: Diagnosis not present

## 2021-05-17 DIAGNOSIS — K219 Gastro-esophageal reflux disease without esophagitis: Secondary | ICD-10-CM | POA: Diagnosis not present

## 2021-05-17 DIAGNOSIS — Z7985 Long-term (current) use of injectable non-insulin antidiabetic drugs: Secondary | ICD-10-CM | POA: Diagnosis not present

## 2021-05-17 DIAGNOSIS — K7460 Unspecified cirrhosis of liver: Secondary | ICD-10-CM | POA: Insufficient documentation

## 2021-05-17 DIAGNOSIS — E119 Type 2 diabetes mellitus without complications: Secondary | ICD-10-CM | POA: Insufficient documentation

## 2021-05-17 HISTORY — PX: AV NODE ABLATION: EP1193

## 2021-05-17 LAB — GLUCOSE, CAPILLARY
Glucose-Capillary: 152 mg/dL — ABNORMAL HIGH (ref 70–99)
Glucose-Capillary: 126 mg/dL — ABNORMAL HIGH (ref 70–99)
Glucose-Capillary: 120 mg/dL — ABNORMAL HIGH (ref 70–99)
Glucose-Capillary: 157 mg/dL — ABNORMAL HIGH (ref 70–99)

## 2021-05-17 SURGERY — AV NODE ABLATION
Anesthesia: Monitor Anesthesia Care

## 2021-05-17 MED ORDER — HEPARIN (PORCINE) IN NACL 1000-0.9 UT/500ML-% IV SOLN
INTRAVENOUS | Status: DC | PRN
  Administered 2021-05-17: 500 mL

## 2021-05-17 MED ORDER — NADOLOL 20 MG PO TABS
10.0000 mg | ORAL_TABLET | Freq: Every day | ORAL | Status: DC
Start: 2021-05-18 — End: 2021-05-18
  Administered 2021-05-18: 10 mg via ORAL
  Filled 2021-05-17: qty 1

## 2021-05-17 MED ORDER — CEFAZOLIN SODIUM-DEXTROSE 2-4 GM/100ML-% IV SOLN
INTRAVENOUS | Status: AC
  Filled 2021-05-17: qty 100

## 2021-05-17 MED ORDER — SODIUM CHLORIDE 0.9% FLUSH: 3.0000 mL | INTRAVENOUS | Status: DC | PRN

## 2021-05-17 MED ORDER — HEPARIN (PORCINE) IN NACL 1000-0.9 UT/500ML-% IV SOLN
INTRAVENOUS | Status: AC
Start: 2021-05-17 — End: ?
  Filled 2021-05-17: qty 500

## 2021-05-17 MED ORDER — INSULIN ASPART 100 UNIT/ML IJ SOLN: 0.0000 [IU] | Freq: Three times a day (TID) | INTRAMUSCULAR | Status: DC

## 2021-05-17 MED ORDER — HEPARIN SODIUM (PORCINE) 1000 UNIT/ML IJ SOLN
INTRAMUSCULAR | Status: AC
  Filled 2021-05-17: qty 10

## 2021-05-17 MED ORDER — ONDANSETRON HCL 4 MG/2ML IJ SOLN: 4.0000 mg | Freq: Four times a day (QID) | INTRAMUSCULAR | Status: DC | PRN

## 2021-05-17 MED ORDER — SODIUM CHLORIDE 0.9 % IV SOLN: 250.0000 mL | INTRAVENOUS | Status: DC | PRN

## 2021-05-17 MED ORDER — BUPIVACAINE HCL (PF) 0.25 % IJ SOLN
INTRAMUSCULAR | Status: DC | PRN
  Administered 2021-05-17: 30 mL

## 2021-05-17 MED ORDER — HEPARIN SODIUM (PORCINE) 1000 UNIT/ML IJ SOLN
INTRAMUSCULAR | Status: DC | PRN
  Administered 2021-05-17: 1000 [IU] via INTRAVENOUS

## 2021-05-17 MED ORDER — MIDAZOLAM HCL 2 MG/2ML IJ SOLN
INTRAMUSCULAR | Status: DC | PRN
  Administered 2021-05-17 (×2): 1 mg via INTRAVENOUS

## 2021-05-17 MED ORDER — SPIRONOLACTONE 12.5 MG HALF TABLET
12.5000 mg | ORAL_TABLET | Freq: Every day | ORAL | Status: DC
  Administered 2021-05-18: 12.5 mg via ORAL
  Filled 2021-05-17: qty 1

## 2021-05-17 MED ORDER — TORSEMIDE 20 MG PO TABS
20.0000 mg | ORAL_TABLET | Freq: Every day | ORAL | Status: DC
  Administered 2021-05-18: 20 mg via ORAL
  Filled 2021-05-17: qty 1

## 2021-05-17 MED ORDER — SODIUM CHLORIDE 0.9% IV SOLUTION: Freq: Once | INTRAVENOUS | Status: AC

## 2021-05-17 MED ORDER — LEVOTHYROXINE SODIUM 75 MCG PO TABS
75.0000 ug | ORAL_TABLET | Freq: Every day | ORAL | Status: DC
  Administered 2021-05-18: 75 ug via ORAL
  Filled 2021-05-17: qty 1

## 2021-05-17 MED ORDER — ACETAMINOPHEN 325 MG PO TABS: 650.0000 mg | ORAL_TABLET | ORAL | Status: DC | PRN

## 2021-05-17 MED ORDER — INSULIN ASPART 100 UNIT/ML IJ SOLN: 0.0000 [IU] | Freq: Every day | INTRAMUSCULAR | Status: DC

## 2021-05-17 MED ORDER — SODIUM CHLORIDE 0.9 % IV SOLN: INTRAVENOUS | Status: DC

## 2021-05-17 MED ORDER — CEFAZOLIN SODIUM-DEXTROSE 2-3 GM-%(50ML) IV SOLR
INTRAVENOUS | Status: DC | PRN
  Administered 2021-05-17: 2 g via INTRAVENOUS

## 2021-05-17 MED ORDER — SODIUM CHLORIDE 0.9% FLUSH
3.0000 mL | Freq: Two times a day (BID) | INTRAVENOUS | Status: DC
  Administered 2021-05-17: 3 mL via INTRAVENOUS

## 2021-05-17 MED ORDER — INSULIN GLARGINE-YFGN 100 UNIT/ML ~~LOC~~ SOLN
120.0000 [IU] | Freq: Every day | SUBCUTANEOUS | Status: DC
  Administered 2021-05-17: 120 [IU] via SUBCUTANEOUS
  Filled 2021-05-17 (×2): qty 1.2

## 2021-05-17 MED ORDER — PANTOPRAZOLE SODIUM 40 MG PO TBEC
40.0000 mg | DELAYED_RELEASE_TABLET | Freq: Every day | ORAL | Status: DC
Start: 2021-05-18 — End: 2021-05-18
  Administered 2021-05-18: 40 mg via ORAL
  Filled 2021-05-17: qty 1

## 2021-05-17 MED ORDER — DICYCLOMINE HCL 10 MG PO CAPS
10.0000 mg | ORAL_CAPSULE | Freq: Three times a day (TID) | ORAL | Status: DC
  Administered 2021-05-18: 10 mg via ORAL
  Filled 2021-05-17: qty 1

## 2021-05-17 MED ORDER — FENTANYL CITRATE (PF) 100 MCG/2ML IJ SOLN
INTRAMUSCULAR | Status: DC | PRN
  Administered 2021-05-17 (×2): 25 ug via INTRAVENOUS
  Administered 2021-05-17: 50 ug via INTRAVENOUS

## 2021-05-17 SURGICAL SUPPLY — 9 items
CATH SMTCH THERMOCOOL SF FJ (CATHETERS) ×1 IMPLANT
CLOSURE PERCLOSE PROSTYLE (VASCULAR PRODUCTS) ×1 IMPLANT
PACK EP LATEX FREE (CUSTOM PROCEDURE TRAY) ×2
PACK EP LF (CUSTOM PROCEDURE TRAY) ×2 IMPLANT
PAD DEFIB RADIO PHYSIO CONN (PAD) ×3 IMPLANT
PATCH CARTO3 (PAD) ×1 IMPLANT
SHEATH PINNACLE 8F 10CM (SHEATH) ×1 IMPLANT
SHEATH PROBE COVER 6X72 (BAG) ×1 IMPLANT
TUBING SMART ABLATE COOLFLOW (TUBING) ×1 IMPLANT

## 2021-05-17 NOTE — Transfer of Care (Signed)
Immediate Anesthesia Transfer of Care Note ? ?Patient: Annette Foster ? ?Procedure(s) Performed: AV NODE ABLATION ? ?Patient Location: Cath Lab ? ?Anesthesia Type:MAC ? ?Level of Consciousness: drowsy ? ?Airway & Oxygen Therapy: Patient Spontanous Breathing and Patient connected to nasal cannula oxygen ? ?Post-op Assessment: Report given to RN and Post -op Vital signs reviewed and stable ? ?Post vital signs: Reviewed and stable ? ?Last Vitals: see post op VS ?Vitals Value Taken Time  ?BP    ?Temp    ?Pulse    ?Resp    ?SpO2    ? ? ?Last Pain:  ?Vitals:  ? 05/17/21 1045  ?TempSrc: Oral  ?PainSc:   ?   ? ?  ? ?Complications: No notable events documented. ?

## 2021-05-17 NOTE — H&P (Addendum)
?Electrophysiology Office Follow up Visit Note:   ?  ?Date:  05/17/2021  ?  ?ID:  Annette Foster, DOB [birth year], MRN [id number] ?  ?PCP:  Albina Billet, MD       ?[facility] Cardiologist:  Nelva Bush, MD  ?[facility] Electrophysiologist:  Vickie Epley, MD  ?  ?  ?Interval History:   ?  ?Annette Foster is a 81 y.o. female who presents for a follow up visit. They were last seen in clinic October 25, 2020 for an evaluation of her atrial fibrillation.  We specifically discussed the possibility of watchman implant.  She begin the work-up with CT scan showed a very shallow left atrial appendage not suitable for watchman implant.  She has since been seen by Dr. Fletcher Anon for highly symptomatic persistent atrial fibrillation with rapid ventricular rates.  When she is in normal rhythm in the past she is bradycardic which precludes safe use of significant doses of AV nodal blockers.  She is referred back to me to discuss possibility of AV junction ablation plus permanent pacemaker implant. ?  ? Today she presents for her AV junction ablation. Her pacemaker was implanted 03/23/2021 and she has done well post procedure.  ?  ?  ?Objective  ?  ?  ?    ?Past Medical History:  ?Diagnosis Date  ? Abdominal pain    ? Allergy    ? Cancer [facility]) 2017  ?  breast cancer- Left  ? Cataract    ? CHF (congestive heart failure) ([place])    ? Collagenous colitis    ? Diabetes mellitus without complication ([place])    ? Diarrhea    ? Diverticulosis    ? Fatty liver disease, nonalcoholic    ? Fibrocystic breast    ? GERD (gastroesophageal reflux disease)    ? Heart murmur    ? Hyperlipidemia    ? Hypertension    ? Hypothyroidism    ? IBS (irritable bowel syndrome)    ? IDA (iron deficiency anemia)    ? Personal history of radiation therapy 2017  ?  LEFT BREAST CA  ? PONV (postoperative nausea and vomiting)    ? Sleep apnea    ?  C-Pap  ?  ?  ?     ?Past Surgical History:  ?Procedure Laterality Date  ? ABDOMINAL HYSTERECTOMY       ?  [place] bso  ? ABDOMINAL SURGERY      ? APPENDECTOMY      ? BREAST BIOPSY Bilateral 2016  ?  negative  ? BREAST 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/2015  ?  DCIS, papillary carcinoma in situ  ? BREAST BIOPSY Left 05/27/2016  ?  BENIGN MAMMARY EPITHELIUM  ? BREAST BIOPSY Left 11/20/2017  ?  affirm bx x clip BENIGN MAMMARY EPITHELIUM CONSISTENT WITH RAD THERAPY  ? BREAST EXCISIONAL BIOPSY Right    ?  NEG 1980's  ? BREAST LUMPECTOMY Left 10/17/2015  ?  DCIS and papillary carcinoma insitu, clear margins  ? CARDIAC SURGERY      ?  has replacement valve  ? CATARACT EXTRACTION Right    ? CATARACT EXTRACTION W/PHACO Left 01/16/2021  ?  Procedure: CATARACT EXTRACTION PHACO AND INTRAOCULAR LENS PLACEMENT (IOC) LEFT DIABETIC 8.46 00:59.8;  Surgeon: Birder Robson, MD;  Location: [place];  Service: Ophthalmology;  Laterality: Left;  ? CHOLECYSTECTOMY      ? COLONOSCOPY WITH PROPOFOL N/A 03/11/2016  ?  Procedure: COLONOSCOPY WITH PROPOFOL;  Surgeon: Gavin Pound  Vira Agar, MD;  Location: [place] ENDOSCOPY;  Service: Endoscopy;  Laterality: N/A;  ? COLONOSCOPY WITH PROPOFOL N/A 02/18/2020  ?  Procedure: COLONOSCOPY WITH PROPOFOL;  Surgeon: [name], MD;  Location: [facility] ENDOSCOPY;  Service: Endoscopy;  Laterality: N/A;  ? ESOPHAGOGASTRODUODENOSCOPY (EGD) WITH PROPOFOL N/A 03/11/2016  ?  Procedure: ESOPHAGOGASTRODUODENOSCOPY (EGD) WITH PROPOFOL;  Surgeon: Manya Silvas, MD;  Location: [facility] ENDOSCOPY;  Service: Endoscopy;  Laterality: N/A;  ? ESOPHAGOGASTRODUODENOSCOPY (EGD) WITH PROPOFOL N/A 02/18/2020  ?  Procedure: ESOPHAGOGASTRODUODENOSCOPY (EGD) WITH PROPOFOL;  Surgeon: [name], MD;  Location: [facility] ENDOSCOPY;  Service: Endoscopy;  Laterality: N/A;  ? ESOPHAGOGASTRODUODENOSCOPY (EGD) WITH PROPOFOL N/A 04/25/2020  ?  Procedure: ESOPHAGOGASTRODUODENOSCOPY (EGD) WITH PROPOFOL;  Surgeon: [name], MD;  Location: [facility] ENDOSCOPY;  Service: Endoscopy;  Laterality: N/A;  ? ESOPHAGOGASTRODUODENOSCOPY (EGD) WITH PROPOFOL N/A  05/23/2020  ?  Procedure: ESOPHAGOGASTRODUODENOSCOPY (EGD) WITH PROPOFOL;  Surgeon: [name], MD;  Location: [facility] ENDOSCOPY;  Service: Endoscopy;  Laterality: N/A;  ? FINGER ARTHROSCOPY WITH CARPOMETACARPEL ([place]) ARTHROPLASTY Right 09/03/2018  ?  Procedure: CARPOMETACARPEL [facility]) ARTHROPLASTY RIGHT THUMB;  Surgeon: Hessie Knows, MD;  Location: [facility] ORS;  Service: Orthopedics;  Laterality: Right;  ? GANGLION CYST EXCISION Right 09/03/2018  ?  Procedure: REMOVAL GANGLION OF WRIST;  Surgeon: Hessie Knows, MD;  Location: [facility] ORS;  Service: Orthopedics;  Laterality: Right;  ? HARDWARE REMOVAL Right 09/03/2018  ?  Procedure: HARDWARE REMOVAL RIGHT THUMB;  Surgeon: Hessie Knows, MD;  Location: [facility] ORS;  Service: Orthopedics;  Laterality: Right;  staple removed  ? JOINT REPLACEMENT Left    ?  TKR  ? left sinusplasty       ? MASTECTOMY, PARTIAL Left 10/17/2015  ?  Procedure: MASTECTOMY PARTIAL REVISION;  Surgeon: Leonie Green, MD;  Location: [facility] ORS;  Service: General;  Laterality: Left;  ? PARTIAL MASTECTOMY WITH NEEDLE LOCALIZATION Left 09/29/2015  ?  Procedure: PARTIAL MASTECTOMY WITH NEEDLE LOCALIZATION;  Surgeon: Leonie Green, MD;  Location: [facility] ORS;  Service: General;  Laterality: Left;  ? TOTAL ABDOMINAL HYSTERECTOMY W/ BILATERAL SALPINGOOPHORECTOMY      ?  ?  ?Current Medications: ?Active Medications  ?    ?Current Meds  ?Medication Sig  ? benzonatate (TESSALON) 200 MG capsule Take 200 mg by mouth 3 (three) times daily as needed for cough.  ? Cholecalciferol (VITAMIN D) 50 MCG (2000 UT) CAPS Take 2,000 Units by mouth daily.  ? clobetasol ointment (TEMOVATE) 6.38 % 1 application two days per week PRN  ? clotrimazole (LOTRIMIN) 1 % cream Apply 1 application topically 2 (two) times daily. Apply to irritated vulvar area twice daily.  ? cyanocobalamin (,VITAMIN B-12,) 1000 MCG/ML injection 1,000 mcg every 30 (thirty) days.  ? dicyclomine (BENTYL) 10 MG capsule Take 10 mg by mouth at bedtime.   ?  docusate sodium (COLACE) 100 MG capsule Take 1 capsule by mouth 2 (two) times daily as needed.  ? EPINEPHrine 0.3 mg/0.3 mL IJ SOAJ injection Inject 0.3 mg into the muscle as needed.  ? esomeprazole (NEXIUM) 40 MG capsule Take 40 mg by mouth 2 (two) times daily before a meal.   ? estradiol (ESTRACE) 0.1 MG/GM vaginal cream Place 1 Applicatorful vaginally as needed.  ? fenofibrate 160 MG tablet Take 160 mg by mouth at bedtime.  ? flecainide (TAMBOCOR) 50 MG tablet Take 1 tablet (50 mg total) by mouth 2 (two) times daily.  ? gabapentin (NEURONTIN) 300 MG capsule Take 300 mg by mouth at bedtime.   ? hydrOXYzine (ATARAX/VISTARIL)  25 MG tablet 1 tablet as needed  ? Insulin Degludec (TRESIBA) 100 UNIT/ML SOLN Inject 120 Units into the skin at bedtime.  ? levothyroxine (SYNTHROID, LEVOTHROID) 75 MCG tablet Take 75 mcg by mouth daily before breakfast.   ? magnesium oxide (MAG-OX) 400 MG tablet Take 400 mg by mouth 2 (two) times daily.   ? nadolol (CORGARD) 20 MG tablet TAKE 1/2 TABLET BY MOUTH DAILY  ? NOVOLOG FLEXPEN 100 UNIT/ML FlexPen Inject into the skin 3 (three) times daily with meals. Based on sliding scale  ? potassium chloride SA (K-DUR,KLOR-CON) 20 MEQ tablet Take 20 mEq by mouth 2 (two) times daily.   ? saccharomyces boulardii (FLORASTOR) 250 MG capsule Take 250 mg by mouth 2 (two) times daily.   ? simvastatin (ZOCOR) 20 MG tablet Take 20 mg by mouth daily.  ? spironolactone (ALDACTONE) 25 MG tablet TAKE 1 TABLET (25 MG TOTAL) BY MOUTH DAILY.  ? Tirzepatide [facility]) Inject into the skin once a week.  ? torsemide (DEMADEX) 20 MG tablet TAKE 1 TABLET BY MOUTH EVERY DAY  ? valACYclovir (VALTREX) 500 MG tablet Take 500 mg by mouth See admin instructions. For fever blisters take 532m twice a day as needed  ?  ?  ?  ?Allergies:   Codeine, Demeclocycline, Demerol [meperidine], Hydrocodone, Oxycodone, Pentazocine, Tetracyclines & related, and Fentanyl  ?  ?Social History  ?  ?     ?Socioeconomic History  ? Marital  status: Married  ?    Spouse name: Not on file  ? Number of children: Not on file  ? Years of education: Not on file  ? Highest education level: Not on file  ?Occupational History  ? Not on file  ?Tobacco Use  ?

## 2021-05-17 NOTE — Anesthesia Preprocedure Evaluation (Signed)
Anesthesia Evaluation  ?Patient identified by MRN, date of birth, ID band ?Patient awake ? ? ? ?Reviewed: ?Allergy & Precautions, NPO status , Patient's Chart, lab work & pertinent test results ? ?History of Anesthesia Complications ?(+) PONV and history of anesthetic complications ? ?Airway ?Mallampati: III ? ?TM Distance: >3 FB ?Neck ROM: Full ? ? ? Dental ? ?(+) Teeth Intact, Dental Advisory Given ?  ?Pulmonary ?neg shortness of breath, sleep apnea and Continuous Positive Airway Pressure Ventilation , neg COPD,  ?  ?breath sounds clear to auscultation ? ? ? ? ? ? Cardiovascular ?hypertension, +CHF  ?+ dysrhythmias + pacemaker  ?Rhythm:Regular  ??1. Left ventricular ejection fraction, by estimation, is 55 to 60%. The  ?left ventricle has normal function. The left ventricle has no regional  ?wall motion abnormalities. There is moderate concentric left ventricular  ?hypertrophy. Left ventricular  ?diastolic parameters are indeterminate.  ??2. Right ventricular systolic function is normal. The right ventricular  ?size is normal. Mildly increased right ventricular wall thickness. There  ?is normal pulmonary artery systolic pressure. The estimated right  ?ventricular systolic pressure is 25.8  ?mmHg.  ??3. Left atrial size was mildly dilated.  ??4. A small pericardial effusion is present.  ??5. The mitral valve is normal in structure. No evidence of mitral valve  ?regurgitation. No evidence of mitral stenosis.  ??6. The aortic valve is normal in structure. Aortic valve regurgitation is  ?not visualized. Aortic valve sclerosis is present, with no evidence of  ?aortic valve stenosis.  ??7. The inferior vena cava is normal in size with greater than 50%  ?respiratory variability, suggesting right atrial pressure of 3 mmHg.  ?  ?Neuro/Psych ?negative neurological ROS ? negative psych ROS  ? GI/Hepatic ?GERD  ,(+) Hepatitis -  ?Endo/Other  ?diabetesHypothyroidism  ? Renal/GU ?Lab Results ?      Component                Value               Date                 ?     CREATININE               0.87                05/14/2021           ?  ? ?  ?Musculoskeletal ? ? Abdominal ?  ?Peds ? Hematology ? ?(+) Blood dyscrasia, anemia , Lab Results ?     Component                Value               Date                 ?     WBC                      2.0 (L)             05/14/2021           ?     HGB                      11.5 (L)            05/14/2021           ?     HCT  34.4 (L)            05/14/2021           ?     MCV                      92.0                05/14/2021           ?     PLT                      80 (L)              05/14/2021           ?   ?Anesthesia Other Findings ? ? Reproductive/Obstetrics ? ?  ? ? ? ? ? ? ? ? ? ? ? ? ? ?  ?  ? ? ? ? ? ? ? ? ?Anesthesia Physical ?Anesthesia Plan ? ?ASA: 3 ? ?Anesthesia Plan: MAC  ? ?Post-op Pain Management: Minimal or no pain anticipated  ? ?Induction:  ? ?PONV Risk Score and Plan: 3 and Treatment may vary due to age or medical condition ? ?Airway Management Planned: Nasal Cannula, Simple Face Mask and Natural Airway ? ?Additional Equipment: None ? ?Intra-op Plan:  ? ?Post-operative Plan:  ? ?Informed Consent: I have reviewed the patients History and Physical, chart, labs and discussed the procedure including the risks, benefits and alternatives for the proposed anesthesia with the patient or authorized representative who has indicated his/her understanding and acceptance.  ? ? ? ?Dental advisory given ? ?Plan Discussed with: CRNA ? ?Anesthesia Plan Comments:   ? ? ? ? ? ? ?Anesthesia Quick Evaluation ? ?

## 2021-05-17 NOTE — Anesthesia Procedure Notes (Signed)
Procedure Name: [place] ?Date/Time: 05/17/2021 1:19 PM ?Performed by: Carolan Clines, CRNA ?Pre-anesthesia Checklist: Patient identified, Emergency Drugs available, Suction available and Patient being monitored ?Patient Re-evaluated:Patient Re-evaluated prior to induction ?Oxygen Delivery Method: Nasal cannula ?Dental Injury: Teeth and Oropharynx as per pre-operative assessment  ? ? ? ? ?

## 2021-05-18 DIAGNOSIS — I5032 Chronic diastolic (congestive) heart failure: Secondary | ICD-10-CM | POA: Diagnosis not present

## 2021-05-18 DIAGNOSIS — I110 Hypertensive heart disease with heart failure: Secondary | ICD-10-CM | POA: Diagnosis not present

## 2021-05-18 DIAGNOSIS — K7460 Unspecified cirrhosis of liver: Secondary | ICD-10-CM | POA: Diagnosis not present

## 2021-05-18 DIAGNOSIS — I4819 Other persistent atrial fibrillation: Secondary | ICD-10-CM | POA: Diagnosis not present

## 2021-05-18 LAB — PREPARE PLATELET PHERESIS: Unit division: 0

## 2021-05-18 LAB — BPAM PLATELET PHERESIS
Blood Product Expiration Date: 202304072359
ISSUE DATE / TIME: 202304060939
Unit Type and Rh: 6200

## 2021-05-18 LAB — GLUCOSE, CAPILLARY: Glucose-Capillary: 101 mg/dL — ABNORMAL HIGH (ref 70–99)

## 2021-05-18 NOTE — Plan of Care (Signed)
?  Problem: Education: ?Goal: Knowledge of General Education information will improve ?Description: Including pain rating scale, medication(s)/side effects and non-pharmacologic comfort measures ?Outcome: Adequate for Discharge ?  ?Problem: Health Behavior/Discharge Planning: ?Goal: Ability to manage health-related needs will improve ?Outcome: Adequate for Discharge ?  ?Problem: Education: ?Goal: Understanding of disease, treatment, and recovery process will improve ?Outcome: Adequate for Discharge ?  ?Problem: Activity: ?Goal: Ability to return to baseline activity level will improve ?Outcome: Adequate for Discharge ?  ?Problem: Cardiac: ?Goal: Ability to maintain adequate cardiovascular perfusion will improve ?Outcome: Adequate for Discharge ?Goal: Vascular access site(s) Level 0-1 will be maintained ?Outcome: Adequate for Discharge ?  ?

## 2021-05-18 NOTE — Discharge Summary (Signed)
? ? ?ELECTROPHYSIOLOGY PROCEDURE DISCHARGE SUMMARY  ? ? ?Patient ID: Annette Foster,  ?MRN: [id number], DOB/AGE: 81/31/1942 81 y.o. ? ?Admit date: 05/17/2021 ?Discharge date: 05/18/2021 ? ?Primary Care Physician: Annette Billet, MD  ?Primary Cardiologist: Annette Bush, MD  ?Electrophysiologist: Dr. Quentin Foster ? ?Primary Discharge Diagnosis:  ?Persistent atrial fibrillation , highly symtomatic ? ?Secondary Discharge Diagnosis:  ?Chronic heart failure with preserved ejection fraction ? ?Procedures This Admission:  ?1.  Electrophysiology study and radiofrequency catheter ablation of  AV node  on 05/17/2021 by  Dr. Quentin Foster .   ?This study demonstrated successful AV nodal ablation with no early apparent complications ? ?Brief HPI: ?Annette Foster is a 81 y.o. female with a history of highly symptomatic Atrial Fibrillation.  She has not been managed on antiarrhythmics due to non-candidacy for even short-term anticoagulation. She recently underwent  pacemaker implant 03/23/2021, and now presented for AV nodal ablation. Risks, benefits, and alternatives to catheter ablation of  her AV node  were reviewed with the patient who wished to proceed.  T ? ?Hospital Course:  ?The patient was admitted and underwent EPS/RFCA of her AV node with details as outlined above.  They were monitored on telemetry overnight which demonstrated appropriate pacing.  Groin was without complication on the day of discharge.  The patient was examined and considered to be stable for discharge.  Wound care and restrictions were reviewed with the patient.  The patient will be seen back by  Dr. Quentin Foster  in 4 weeks for post ablation follow up.  ? ?Physical Exam: ?Vitals:  ? 05/17/21 1522 05/17/21 1614 05/17/21 2043 05/18/21 0657  ?BP: (!) 133/59 (!) 129/55 118/74 (!) 109/54  ?Pulse: 60 68 62 64  ?Resp: 20 16 17 20   ?Temp: 98.3 ?F (36.8 ?C) 98 ?F (36.7 ?C) 98.1 ?F (36.7 ?C) 98.1 ?F (36.7 ?C)  ?TempSrc: Oral Oral Oral Oral  ?SpO2: 94% 95% 98% 98%  ?Weight:       ?Height:      ? ? ?GEN- The patient is well appearing, alert and oriented x 3 today.   ?HEENT: normocephalic, atraumatic; sclera clear, conjunctiva pink; hearing intact; oropharynx clear; neck supple  ?Lungs- Clear to ausculation bilaterally, normal work of breathing.  No wheezes, rales, rhonchi ?Heart- Regular rate and rhythm, no murmurs, rubs or gallops  ?GI- soft, non-tender, non-distended, bowel sounds present  ?Extremities- no clubbing, cyanosis, or edema; DP/PT/radial pulses 2+ bilaterally, groin without hematoma/bruit ?MS- no significant deformity or atrophy ?Skin- warm and dry, no rash or lesion ?Psych- euthymic mood, full affect ?Neuro- strength and sensation are intact ? ? ?Labs: ?  ?Lab Results  ?Component Value Date  ? WBC 2.0 (L) 05/14/2021  ? HGB 11.5 (L) 05/14/2021  ? HCT 34.4 (L) 05/14/2021  ? MCV 92.0 05/14/2021  ? PLT 80 (L) 05/14/2021  ?  ?Recent Labs  ?Lab 05/14/21 ?1236  ?NA 132*  ?K 3.8  ?CL 96*  ?CO2 29  ?BUN 18  ?CREATININE 0.87  ?CALCIUM 8.8*  ?PROT 7.2  ?BILITOT 1.4*  ?ALKPHOS 49  ?ALT 15  ?AST 26  ?GLUCOSE 155*  ? ? ? ?Discharge Medications:  ?Allergies as of 05/18/2021   ? ?   Reactions  ? Codeine Itching, Nausea And Vomiting  ? Demeclocycline Rash  ? Demerol [meperidine] Itching, Nausea And Vomiting  ? Hydrocodone Itching, Nausea And Vomiting  ? Oxycodone Itching, Nausea And Vomiting  ? Pentazocine Itching, Nausea And Vomiting  ? Tetracyclines & Related Rash  ? Fentanyl Itching,  Nausea And Vomiting  ? D/W surgical nurse on 7/23 and pt reportedly tolerated dose during surgery.  Dose was given by MD.  Pt reported that she only gets itching and did not object to receiving med  ? ?  ? ?  ?Medication List  ?  ? ?TAKE these medications   ? ?acetaminophen 325 MG tablet ?Commonly known as: TYLENOL ?Take 1-2 tablets (325-650 mg total) by mouth every 4 (four) hours as needed for mild pain. ?  ?benzonatate 200 MG capsule ?Commonly known as: TESSALON ?Take 200 mg by mouth 3 (three) times daily as  needed for cough. ?  ?clobetasol ointment 0.05 % ?Commonly known as: TEMOVATE ?Apply 1 application topically as needed (vaginal irritation). ?  ?clotrimazole 1 % cream ?Commonly known as: LOTRIMIN ?Apply 1 application topically 2 (two) times daily. Apply to irritated vulvar area twice daily. ?What changed:  ?when to take this ?reasons to take this ?additional instructions ?  ?cyanocobalamin 1000 MCG/ML injection ?Commonly known as: (VITAMIN B-12) ?1,000 mcg every 30 (thirty) days. ?  ?desloratadine 5 MG tablet ?Commonly known as: CLARINEX ?Take 5 mg by mouth daily. ?  ?dicyclomine 10 MG capsule ?Commonly known as: BENTYL ?Take 10 mg by mouth in the morning, at noon, and at bedtime. ?  ?docusate sodium 100 MG capsule ?Commonly known as: COLACE ?Take 100 mg by mouth 3 (three) times daily as needed for moderate constipation. ?  ?EPINEPHrine 0.3 mg/0.3 mL Soaj injection ?Commonly known as: EPI-PEN ?Inject 0.3 mg into the muscle as needed for anaphylaxis. ?  ?esomeprazole 40 MG capsule ?Commonly known as: [place] ?Take 40 mg by mouth 2 (two) times daily before a meal. ?  ?estradiol 0.1 MG/GM vaginal cream ?Commonly known as: ESTRACE ?Place 1 Applicatorful vaginally daily as needed (irritation). ?  ?fenofibrate 160 MG tablet ?Take 160 mg by mouth at bedtime. ?  ?gabapentin 300 MG capsule ?Commonly known as: NEURONTIN ?Take 300 mg by mouth at bedtime. ?  ?hydrOXYzine 25 MG tablet ?Commonly known as: ATARAX ?Take 25 mg by mouth every 6 (six) hours as needed for anxiety, itching, nausea or vomiting. ?  ?levothyroxine 75 MCG tablet ?Commonly known as: SYNTHROID ?Take 75 mcg by mouth daily before breakfast. ?  ?magnesium oxide 400 MG tablet ?Commonly known as: MAG-OX ?Take 400 mg by mouth 2 (two) times daily. ?  ?nadolol 20 MG tablet ?Commonly known as: CORGARD ?Take 1 tablet (20 mg total) by mouth daily. ?What changed: how much to take ?  ?NovoLOG FlexPen 100 UNIT/ML FlexPen ?Generic drug: insulin aspart ?Inject 48-68 Units  into the skin 3 (three) times daily with meals. Based on sliding scale ?  ?potassium chloride SA 20 MEQ tablet ?Commonly known as: KLOR-CON M ?Take 20 mEq by mouth 2 (two) times daily. ?  ?saccharomyces boulardii 250 MG capsule ?Commonly known as: FLORASTOR ?Take 250 mg by mouth 2 (two) times daily. ?  ?simvastatin 20 MG tablet ?Commonly known as: ZOCOR ?Take 20 mg by mouth at bedtime. ?  ?spironolactone 25 MG tablet ?Commonly known as: ALDACTONE ?TAKE 1 TABLET (25 MG TOTAL) BY MOUTH DAILY. ?What changed: how much to take ?  ?tirzepatide 12.5 MG/0.5ML Pen ?Commonly known as: [place] ?Inject 12.5 mg into the skin once a week. ?  ?torsemide 20 MG tablet ?Commonly known as: DEMADEX ?TAKE 1 TABLET BY MOUTH EVERY DAY ?  ?Tresiba 100 UNIT/ML Soln ?Generic drug: Insulin Degludec ?Inject 140 Units into the skin at bedtime. ?  ?valACYclovir 500 MG tablet ?Commonly known  as: VALTREX ?Take 500 mg by mouth 2 (two) times daily as needed (fever blisters). ?  ?Vitamin D 50 MCG (2000 UT) Caps ?Take 2,000 Units by mouth daily. ?  ? ?  ? ? ?Disposition:  ? ? Follow-up Information   ? ? Vickie Epley, MD Follow up.   ?Specialties: Cardiology, Radiology ?Why: on 5/10 at 1140 for post ablation follow up ?Contact information: ?[address] ?[place] [address] ?[phone] ? ? ?  ?  ? ?  ?  ? ?  ? ? ?Duration of Discharge Encounter: Greater than 30 minutes including physician time. ? ?Signed, ?Shirley Friar, PA-C  ?05/18/2021 ?8:42 AM ? ? ? ?

## 2021-05-18 NOTE — Anesthesia Postprocedure Evaluation (Signed)
Anesthesia Post Note ? ?Patient: Annette Foster ? ?Procedure(s) Performed: AV NODE ABLATION ? ?  ? ?Patient location during evaluation: PACU ?Anesthesia Type: MAC ?Level of consciousness: awake and alert ?Pain management: pain level controlled ?Vital Signs Assessment: post-procedure vital signs reviewed and stable ?Respiratory status: spontaneous breathing, nonlabored ventilation and respiratory function stable ?Cardiovascular status: stable and blood pressure returned to baseline ?Postop Assessment: no apparent nausea or vomiting ?Anesthetic complications: no ? ? ?No notable events documented. ? ?Last Vitals:  ?Vitals:  ? 05/17/21 2043 05/18/21 0657  ?BP: 118/74 (!) 109/54  ?Pulse: 62 64  ?Resp: 17 20  ?Temp: 36.7 ?C 36.7 ?C  ?SpO2: 98% 98%  ?  ?Last Pain:  ?Vitals:  ? 05/18/21 0735  ?TempSrc:   ?PainSc: 0-No pain  ? ? ?  ?  ?  ?  ?  ?  ? ?Lukasz Rogus ? ? ? ? ?

## 2021-05-18 NOTE — Progress Notes (Signed)
Completed discharge medication lists. Reviewed discharge post procedure care. Reviewed next medication administration times and post follow up appointments. Patient verbalized understanding of discharge instructions. All belongings to include personal glucose monitor, clothing, glasses, and cell phone. Removed IV and telemetry. Notified CCMD.  ?

## 2021-05-18 NOTE — Discharge Instructions (Signed)

## 2021-05-28 ENCOUNTER — Inpatient Hospital Stay: Payer: Medicare PPO

## 2021-05-28 DIAGNOSIS — D61818 Other pancytopenia: Secondary | ICD-10-CM

## 2021-05-28 DIAGNOSIS — K7460 Unspecified cirrhosis of liver: Secondary | ICD-10-CM

## 2021-05-28 DIAGNOSIS — E611 Iron deficiency: Secondary | ICD-10-CM

## 2021-05-28 DIAGNOSIS — D500 Iron deficiency anemia secondary to blood loss (chronic): Secondary | ICD-10-CM | POA: Diagnosis not present

## 2021-05-28 LAB — CBC WITH DIFFERENTIAL/PLATELET
Abs Immature Granulocytes: 0.01 10*3/uL (ref 0.00–0.07)
Basophils Absolute: 0 10*3/uL (ref 0.0–0.1)
Basophils Relative: 1 %
Eosinophils Absolute: 0.1 10*3/uL (ref 0.0–0.5)
Eosinophils Relative: 3 %
HCT: 36.6 % (ref 36.0–46.0)
Hemoglobin: 12.3 g/dL (ref 12.0–15.0)
Immature Granulocytes: 0 %
Lymphocytes Relative: 30 %
Lymphs Abs: 0.7 10*3/uL (ref 0.7–4.0)
MCH: 30.7 pg (ref 26.0–34.0)
MCHC: 33.6 g/dL (ref 30.0–36.0)
MCV: 91.3 fL (ref 80.0–100.0)
Monocytes Absolute: 0.3 10*3/uL (ref 0.1–1.0)
Monocytes Relative: 12 %
Neutro Abs: 1.3 10*3/uL — ABNORMAL LOW (ref 1.7–7.7)
Neutrophils Relative %: 54 %
Platelets: 81 10*3/uL — ABNORMAL LOW (ref 150–400)
RBC: 4.01 MIL/uL (ref 3.87–5.11)
RDW: 13.6 % (ref 11.5–15.5)
WBC: 2.4 10*3/uL — ABNORMAL LOW (ref 4.0–10.5)
nRBC: 0 % (ref 0.0–0.2)

## 2021-05-28 LAB — COMPREHENSIVE METABOLIC PANEL
ALT: 18 U/L (ref 0–44)
AST: 30 U/L (ref 15–41)
Albumin: 3.9 g/dL (ref 3.5–5.0)
Alkaline Phosphatase: 47 U/L (ref 38–126)
Anion gap: 7 (ref 5–15)
BUN: 22 mg/dL (ref 8–23)
CO2: 29 mmol/L (ref 22–32)
Calcium: 9.2 mg/dL (ref 8.9–10.3)
Chloride: 95 mmol/L — ABNORMAL LOW (ref 98–111)
Creatinine, Ser: 0.88 mg/dL (ref 0.44–1.00)
GFR, Estimated: >60 mL/min (ref >60)
Glucose, Bld: 162 mg/dL — ABNORMAL HIGH (ref 70–99)
Potassium: 4 mmol/L (ref 3.5–5.1)
Sodium: 131 mmol/L — ABNORMAL LOW (ref 135–145)
Total Bilirubin: 1 mg/dL (ref 0.3–1.2)
Total Protein: 8 g/dL (ref 6.5–8.1)

## 2021-05-29 ENCOUNTER — Other Ambulatory Visit
Admission: RE | Admit: 2021-05-29 | Discharge: 2021-05-29 | Disposition: A | Discharge status: Home / Self Care | Payer: Medicare PPO | Attending: Internal Medicine | Admitting: Internal Medicine

## 2021-06-04 ENCOUNTER — Telehealth: Payer: Self-pay | Admitting: Gastroenterology

## 2021-06-04 NOTE — Telephone Encounter (Signed)
Pt called complaining with severe stomach pain for the last several days and was wondering is there anyway possible that she could be seen today. ?

## 2021-06-05 ENCOUNTER — Ambulatory Visit
Admission: RE | Admit: 2021-06-05 | Discharge: 2021-06-05 | Disposition: A | Discharge status: Home / Self Care | Payer: Medicare PPO | Source: Ambulatory Visit | Attending: Internal Medicine | Admitting: Internal Medicine

## 2021-06-05 ENCOUNTER — Other Ambulatory Visit (HOSPITAL_COMMUNITY): Payer: Self-pay | Admitting: Internal Medicine

## 2021-06-05 ENCOUNTER — Other Ambulatory Visit: Payer: Self-pay | Admitting: Internal Medicine

## 2021-06-05 DIAGNOSIS — R1013 Epigastric pain: Secondary | ICD-10-CM

## 2021-06-05 MED ORDER — IOHEXOL 300 MG/ML  SOLN
100.0000 mL | Freq: Once | INTRAMUSCULAR | Status: AC | PRN
  Administered 2021-06-05: 100 mL via INTRAVENOUS

## 2021-06-05 NOTE — Telephone Encounter (Signed)
Pt was seen by PCP per pt report and is awaiting CT scan now... Pt advised to call back and ask for me if it may be something that she needs to f/u with GI.Marland KitchenMarland Kitchen ?

## 2021-06-19 NOTE — Progress Notes (Signed)
?Electrophysiology Office Follow up Visit Note:   ? ?Date:  06/20/2021  ? ?ID:  Annette Foster, DOB 07/22/1940, MRN [id number] ? ?PCP:  Albina Billet, MD  ?[facility] Cardiologist:  Nelva Bush, MD  ?[facility] Electrophysiologist:  Vickie Epley, MD  ? ? ?Interval History:   ? ?Annette Foster is a 81 y.o. female who presents for a follow up visit. 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  ?She has done well since the procedures. Device checks have shown stable device function. ? ? ?  ? ?Past Medical History:  ?Diagnosis Date  ? Abdominal pain   ? Allergy   ? Cancer [facility]) 2017  ? breast cancer- Left  ? Cataract   ? CHF (congestive heart failure) ([place])   ? Collagenous colitis   ? Diabetes mellitus without complication ([place])   ? Diarrhea   ? Diverticulosis   ? Fatty liver disease, nonalcoholic   ? Fibrocystic breast   ? GERD (gastroesophageal reflux disease)   ? Heart murmur   ? Hyperlipidemia   ? Hypertension   ? Hypothyroidism   ? IBS (irritable bowel syndrome)   ? IDA (iron deficiency anemia)   ? Personal history of radiation therapy 2017  ? LEFT BREAST CA  ? PONV (postoperative nausea and vomiting)   ? Sleep apnea   ? C-Pap  ? ? ?Past Surgical History:  ?Procedure Laterality Date  ? ABDOMINAL HYSTERECTOMY    ? tah bso  ? ABDOMINAL SURGERY    ? APPENDECTOMY    ? AV NODE ABLATION N/A 05/17/2021  ? Procedure: AV NODE ABLATION;  Surgeon: Vickie Epley, MD;  Location: [place] CV [facility];  Service: Cardiovascular;  Laterality: N/A;  ? BREAST BIOPSY Bilateral 2016  ? negative  ? BREAST 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/2015  ? DCIS, papillary carcinoma in situ  ? BREAST BIOPSY Left 05/27/2016  ? BENIGN MAMMARY EPITHELIUM  ? BREAST BIOPSY Left 11/20/2017  ? affirm bx x clip BENIGN MAMMARY EPITHELIUM CONSISTENT WITH RAD THERAPY  ? BREAST EXCISIONAL BIOPSY Right   ? NEG 1980's  ? BREAST LUMPECTOMY Left 10/17/2015  ? DCIS and papillary carcinoma insitu, clear margins  ? CARDIAC SURGERY    ? has  replacement valve  ? CATARACT EXTRACTION Right   ? CATARACT EXTRACTION W/PHACO Left 01/16/2021  ? Procedure: CATARACT EXTRACTION PHACO AND INTRAOCULAR LENS PLACEMENT (IOC) LEFT DIABETIC 8.46 00:59.8;  Surgeon: Birder Robson, MD;  Location: [place];  Service: Ophthalmology;  Laterality: Left;  ? CHOLECYSTECTOMY    ? COLONOSCOPY WITH PROPOFOL N/A 03/11/2016  ? Procedure: COLONOSCOPY WITH PROPOFOL;  Surgeon: Manya Silvas, MD;  Location: [facility] ENDOSCOPY;  Service: Endoscopy;  Laterality: N/A;  ? COLONOSCOPY WITH PROPOFOL N/A 02/18/2020  ? Procedure: COLONOSCOPY WITH PROPOFOL;  Surgeon: [name], MD;  Location: [facility] ENDOSCOPY;  Service: Endoscopy;  Laterality: N/A;  ? ESOPHAGOGASTRODUODENOSCOPY (EGD) WITH PROPOFOL N/A 03/11/2016  ? Procedure: ESOPHAGOGASTRODUODENOSCOPY (EGD) WITH PROPOFOL;  Surgeon: Manya Silvas, MD;  Location: [facility] ENDOSCOPY;  Service: Endoscopy;  Laterality: N/A;  ? ESOPHAGOGASTRODUODENOSCOPY (EGD) WITH PROPOFOL N/A 02/18/2020  ? Procedure: ESOPHAGOGASTRODUODENOSCOPY (EGD) WITH PROPOFOL;  Surgeon: [name], MD;  Location: [facility] ENDOSCOPY;  Service: Endoscopy;  Laterality: N/A;  ? ESOPHAGOGASTRODUODENOSCOPY (EGD) WITH PROPOFOL N/A 04/25/2020  ? Procedure: ESOPHAGOGASTRODUODENOSCOPY (EGD) WITH PROPOFOL;  Surgeon: [name], MD;  Location: [facility] ENDOSCOPY;  Service: Endoscopy;  Laterality: N/A;  ? ESOPHAGOGASTRODUODENOSCOPY (EGD) WITH PROPOFOL N/A 05/23/2020  ? Procedure: ESOPHAGOGASTRODUODENOSCOPY (EGD) WITH  PROPOFOL;  Surgeon: [name], MD;  Location: [facility] ENDOSCOPY;  Service: Endoscopy;  Laterality: N/A;  ? FINGER ARTHROSCOPY WITH CARPOMETACARPEL ([place]) ARTHROPLASTY Right 09/03/2018  ? Procedure: CARPOMETACARPEL [facility]) ARTHROPLASTY RIGHT THUMB;  Surgeon: Hessie Knows, MD;  Location: [facility] ORS;  Service: Orthopedics;  Laterality: Right;  ? GANGLION CYST EXCISION Right 09/03/2018  ? Procedure: REMOVAL GANGLION OF WRIST;  Surgeon: Hessie Knows, MD;  Location: [facility] ORS;   Service: Orthopedics;  Laterality: Right;  ? HARDWARE REMOVAL Right 09/03/2018  ? Procedure: HARDWARE REMOVAL RIGHT THUMB;  Surgeon: Hessie Knows, MD;  Location: [facility] ORS;  Service: Orthopedics;  Laterality: Right;  staple removed  ? JOINT REPLACEMENT Left   ? TKR  ? left sinusplasty     ? MASTECTOMY, PARTIAL Left 10/17/2015  ? Procedure: MASTECTOMY PARTIAL REVISION;  Surgeon: Leonie Green, MD;  Location: [facility] ORS;  Service: General;  Laterality: Left;  ? PACEMAKER IMPLANT N/A 03/23/2021  ? Procedure: PACEMAKER IMPLANT;  Surgeon: Vickie Epley, MD;  Location: [place] CV LAB;  Service: Cardiovascular;  Laterality: N/A;  ? PARTIAL MASTECTOMY WITH NEEDLE LOCALIZATION Left 09/29/2015  ? Procedure: PARTIAL MASTECTOMY WITH NEEDLE LOCALIZATION;  Surgeon: Leonie Green, MD;  Location: [facility] ORS;  Service: General;  Laterality: Left;  ? TOTAL ABDOMINAL HYSTERECTOMY W/ BILATERAL SALPINGOOPHORECTOMY    ? ? ?Current Medications: ?Current Meds  ?Medication Sig  ? acetaminophen (TYLENOL) 325 MG tablet Take 1-2 tablets (325-650 mg total) by mouth every 4 (four) hours as needed for mild pain.  ? benzonatate (TESSALON) 200 MG capsule Take 200 mg by mouth 3 (three) times daily as needed for cough.  ? Cholecalciferol (VITAMIN D) 50 MCG (2000 UT) CAPS Take 2,000 Units by mouth daily.  ? clobetasol ointment (TEMOVATE) 4.76 % Apply 1 application topically as needed (vaginal irritation).  ? clotrimazole (LOTRIMIN) 1 % cream Apply 1 application topically 2 (two) times daily. Apply to irritated vulvar area twice daily. (Patient taking differently: Apply 1 application. topically 2 (two) times daily as needed (Vagnial irritation).)  ? cyanocobalamin (,VITAMIN B-12,) 1000 MCG/ML injection 1,000 mcg every 30 (thirty) days.  ? desloratadine (CLARINEX) 5 MG tablet Take 5 mg by mouth daily.  ? dicyclomine (BENTYL) 10 MG capsule Take 10 mg by mouth in the morning, at noon, and at bedtime.  ? docusate sodium (COLACE) 100 MG  capsule Take 100 mg by mouth 3 (three) times daily as needed for moderate constipation.  ? EPINEPHrine 0.3 mg/0.3 mL IJ SOAJ injection Inject 0.3 mg into the muscle as needed for anaphylaxis.  ? esomeprazole (NEXIUM) 40 MG capsule Take 40 mg by mouth 2 (two) times daily before a meal.   ? estradiol (ESTRACE) 0.1 MG/GM vaginal cream Place 1 Applicatorful vaginally daily as needed (irritation).  ? fenofibrate 160 MG tablet Take 160 mg by mouth at bedtime.  ? gabapentin (NEURONTIN) 300 MG capsule Take 300 mg by mouth at bedtime.   ? hydrOXYzine (ATARAX/VISTARIL) 25 MG tablet Take 25 mg by mouth every 6 (six) hours as needed for anxiety, itching, nausea or vomiting.  ? Insulin Degludec (TRESIBA) 100 UNIT/ML SOLN Inject 140 Units into the skin at bedtime.  ? levothyroxine (SYNTHROID, LEVOTHROID) 75 MCG tablet Take 75 mcg by mouth daily before breakfast.   ? magnesium oxide (MAG-OX) 400 MG tablet Take 400 mg by mouth 2 (two) times daily.   ? nadolol (CORGARD) 20 MG tablet Take 10 mg by mouth daily.  ? NOVOLOG FLEXPEN 100 UNIT/ML FlexPen Inject 48-68 Units into  the skin 3 (three) times daily with meals. Based on sliding scale  ? potassium chloride SA (K-DUR,KLOR-CON) 20 MEQ tablet Take 20 mEq by mouth 2 (two) times daily.   ? saccharomyces boulardii (FLORASTOR) 250 MG capsule Take 250 mg by mouth 2 (two) times daily.   ? simvastatin (ZOCOR) 20 MG tablet Take 20 mg by mouth at bedtime.  ? spironolactone (ALDACTONE) 25 MG tablet Take 12.5 mg by mouth daily.  ? tirzepatide (MOUNJARO) 12.5 MG/0.5ML Pen Inject 12.5 mg into the skin once a week.  ? torsemide (DEMADEX) 20 MG tablet TAKE 1 TABLET BY MOUTH EVERY DAY  ? valACYclovir (VALTREX) 500 MG tablet Take 500 mg by mouth 2 (two) times daily as needed (fever blisters).  ?  ? ?Allergies:   Codeine, Demeclocycline, Demerol [meperidine], Hydrocodone, Oxycodone, Pentazocine, Tetracyclines & related, and Fentanyl  ? ?Social History  ? ?Socioeconomic History  ? Marital status:  Married  ?  Spouse name: Not on file  ? Number of children: Not on file  ? Years of education: Not on file  ? Highest education level: Not on file  ?Occupational History  ? Not on file  ?Tobacco Use  ? Smoking

## 2021-06-20 ENCOUNTER — Ambulatory Visit (INDEPENDENT_AMBULATORY_CARE_PROVIDER_SITE_OTHER): Payer: Medicare PPO | Admitting: Cardiology

## 2021-06-20 ENCOUNTER — Encounter: Payer: Self-pay | Admitting: Cardiology

## 2021-06-20 VITALS — BP 112/60 | HR 60 | Ht 63.0 in | Wt 147.8 lb

## 2021-06-20 DIAGNOSIS — R001 Bradycardia, unspecified: Secondary | ICD-10-CM | POA: Diagnosis not present

## 2021-06-20 DIAGNOSIS — I5032 Chronic diastolic (congestive) heart failure: Secondary | ICD-10-CM

## 2021-06-20 DIAGNOSIS — I4891 Unspecified atrial fibrillation: Secondary | ICD-10-CM | POA: Diagnosis not present

## 2021-06-20 LAB — PACEMAKER DEVICE OBSERVATION

## 2021-06-20 NOTE — Patient Instructions (Signed)
Medications: ?Your physician recommends that you continue on your current medications as directed. Please refer to the Current Medication list given to you today. ?*If you need a refill on your cardiac medications before your next appointment, please call your pharmacy* ? ?Lab Work: ?None. ?If you have labs (blood work) drawn today and your tests are completely normal, you will receive your results only by: ?MyChart Message (if you have MyChart) OR ?A paper copy in the mail ?If you have any lab test that is abnormal or we need to change your treatment, we will call you to review the results. ? ?Testing/Procedures: ?None. ? ?Follow-Up: ?At [facility], you and your health needs are our priority.  As part of our continuing mission to provide you with exceptional heart care, we have created designated Provider Care Teams.  These Care Teams include your primary Cardiologist (physician) and Advanced Practice Providers (APPs -  Physician Assistants and Nurse Practitioners) who all work together to provide you with the care you need, when you need it. ? ?Your physician wants you to follow-up in: 12 months with Lars Mage, MD  ? ?  You will receive a reminder letter in the mail two months in advance. If you don't receive a letter, please call our office to schedule the follow-up appointment. ? ?We recommend signing up for the patient portal called "MyChart".  Sign up information is provided on this After Visit Summary.  MyChart is used to connect with patients for Virtual Visits (Telemedicine).  Patients are able to view lab/test results, encounter notes, upcoming appointments, etc.  Non-urgent messages can be sent to your provider as well.   ?To learn more about what you can do with MyChart, go to [email or web address].   ? ?Any Other Special Instructions Will Be Listed Below (If Applicable). ? ?

## 2021-06-21 LAB — CUP PACEART REMOTE DEVICE CHECK
Battery Remaining Percentage: 100 %
Brady Statistic RA Percent Paced: 78 %
Brady Statistic RV Percent Paced: 96 %
Date Time Interrogation Session: 20230509094426
Implantable Lead Implant Date: 20230210
Implantable Lead Implant Date: 20230210
Implantable Lead Location: 753859
Implantable Lead Location: 753860
Implantable Lead Model: 377169
Implantable Lead Model: 377171
Implantable Lead Serial Number: 7000400966
Implantable Lead Serial Number: 7000402075
Implantable Pulse Generator Implant Date: 20230210
Lead Channel Impedance Value: 507 Ohm
Lead Channel Impedance Value: 585 Ohm
Lead Channel Pacing Threshold Amplitude: 0.8 V
Lead Channel Pacing Threshold Amplitude: 1 V
Lead Channel Pacing Threshold Pulse Width: 0.4 ms
Lead Channel Pacing Threshold Pulse Width: 0.4 ms
Lead Channel Sensing Intrinsic Amplitude: 1.9 mV
Lead Channel Sensing Intrinsic Amplitude: 13 mV
Lead Channel Setting Pacing Amplitude: 3 V
Lead Channel Setting Pacing Amplitude: 3 V
Lead Channel Setting Pacing Pulse Width: 0.4 ms
Pulse Gen Model: 407145
Pulse Gen Serial Number: 70331369

## 2021-06-22 ENCOUNTER — Ambulatory Visit (INDEPENDENT_AMBULATORY_CARE_PROVIDER_SITE_OTHER): Payer: Medicare PPO

## 2021-06-22 DIAGNOSIS — I495 Sick sinus syndrome: Secondary | ICD-10-CM | POA: Diagnosis not present

## 2021-06-28 NOTE — Progress Notes (Signed)
Remote pacemaker transmission.   

## 2021-07-04 ENCOUNTER — Encounter: Payer: Medicare PPO | Admitting: Cardiology

## 2021-07-24 ENCOUNTER — Encounter: Payer: Self-pay | Admitting: Cardiology

## 2021-07-30 ENCOUNTER — Telehealth: Payer: Self-pay | Admitting: Cardiology

## 2021-07-30 NOTE — Telephone Encounter (Signed)
The patient said last week she started having pain in her right groin. This is where they went in for the ablation. Of note ablation on 05/17/21. The pain woke her up from sleep at around 3 am on 6/14 and it bothered her all weekend. She states the pain comes and goes with or without activity, this pain "feels like her leg is going to give out". Had the patient push on the area to see if it hurts more, she said right now it is not hurting and pushing on it doesn't make a difference but it has previously. She has been taking tylenol. There is no swelling in the groin area. Moving the right leg around and lifting it up doesn't seem to make a difference.  Will forward to Dr. Quentin Ore for advisement.

## 2021-07-30 NOTE — Telephone Encounter (Signed)
Pt states that she has been having pain in her groin after her ablation. Pt states that this has been going on for a week. It got so bad, she said she can barely walk.

## 2021-08-01 ENCOUNTER — Telehealth: Payer: Self-pay

## 2021-08-01 NOTE — Telephone Encounter (Signed)
I called pt to r/s her 6 mth f/u appt for next Wed... pt reports she has been having abd pain, sore to touch periumbilical region... pt denies any other Sx or radiation of pain... She was seen by PCP today and PCP recommended being seen by you... appt had already been r/s to 7/31 for f/u cirrhosis.... Please advise

## 2021-08-08 ENCOUNTER — Ambulatory Visit: Payer: Medicare PPO | Admitting: Gastroenterology

## 2021-08-09 ENCOUNTER — Encounter: Payer: Self-pay | Admitting: Podiatry

## 2021-08-09 ENCOUNTER — Ambulatory Visit (INDEPENDENT_AMBULATORY_CARE_PROVIDER_SITE_OTHER): Payer: Medicare PPO | Admitting: Podiatry

## 2021-08-09 DIAGNOSIS — B351 Tinea unguium: Secondary | ICD-10-CM

## 2021-08-09 DIAGNOSIS — E1142 Type 2 diabetes mellitus with diabetic polyneuropathy: Secondary | ICD-10-CM

## 2021-08-09 DIAGNOSIS — M2041 Other hammer toe(s) (acquired), right foot: Secondary | ICD-10-CM

## 2021-08-09 DIAGNOSIS — M79674 Pain in right toe(s): Secondary | ICD-10-CM | POA: Diagnosis not present

## 2021-08-09 DIAGNOSIS — M79675 Pain in left toe(s): Secondary | ICD-10-CM

## 2021-08-09 DIAGNOSIS — M2010 Hallux valgus (acquired), unspecified foot: Secondary | ICD-10-CM

## 2021-08-09 NOTE — Progress Notes (Signed)
This patient returns to my office for at risk foot care.  This patient requires this care by a professional since this patient will be at risk due to having diabetes  This patient is unable to cut nails himself since the patient cannot reach his nails.These nails are painful walking and wearing shoes.  This patient presents for at risk foot care today.    General Appearance  Alert, conversant and in no acute stress.  Vascular  Dorsalis pedis and posterior tibial  pulses are palpable  bilaterally.  Capillary return is within normal limits  bilaterally. Temperature is within normal limits  bilaterally.  Neurologic  Senn-Weinstein monofilament test dimininished  bilaterally. Muscle power within normal limits bilaterally.  Nails Thick disfigured discolored nails with subungual debris  from hallux to fifth toes bilaterally. No evidence of bacterial infection or drainage bilaterally.  Orthopedic  No limitations of motion  feet .  No crepitus or effusions noted.  No bony pathology or digital deformities noted. HAV  B/L.  Hammer toes  B/L.  Midfoot DJD  B/L.  Skin  normotropic skin with no porokeratosis noted bilaterally.  No signs of infections or ulcers noted.     Onychomycosis  Pain in right toes  Pain in left toes  Consent was obtained for treatment procedures.   Mechanical debridement of nails 1-5  bilaterally performed with a nail nipper.  Filed with dremel without incident.     Return office visit    3 months                Told patient to return for periodic foot care and evaluation due to potential at risk complications.   Gardiner Barefoot DPM

## 2021-08-22 ENCOUNTER — Encounter: Payer: Self-pay | Admitting: Internal Medicine

## 2021-08-22 ENCOUNTER — Ambulatory Visit (INDEPENDENT_AMBULATORY_CARE_PROVIDER_SITE_OTHER): Payer: Medicare PPO | Admitting: Internal Medicine

## 2021-08-22 VITALS — BP 110/80 | HR 71 | Ht 63.0 in | Wt 148.0 lb

## 2021-08-22 DIAGNOSIS — K7460 Unspecified cirrhosis of liver: Secondary | ICD-10-CM | POA: Diagnosis not present

## 2021-08-22 DIAGNOSIS — I495 Sick sinus syndrome: Secondary | ICD-10-CM

## 2021-08-22 DIAGNOSIS — Z8719 Personal history of other diseases of the digestive system: Secondary | ICD-10-CM

## 2021-08-22 DIAGNOSIS — I5032 Chronic diastolic (congestive) heart failure: Secondary | ICD-10-CM | POA: Diagnosis not present

## 2021-08-22 DIAGNOSIS — I480 Paroxysmal atrial fibrillation: Secondary | ICD-10-CM

## 2021-08-22 MED ORDER — NADOLOL 20 MG PO TABS: 10.0000 mg | ORAL_TABLET | Freq: Every day | ORAL | 0 refills | Status: DC

## 2021-08-22 NOTE — Patient Instructions (Signed)
Medication Instructions:   Your physician recommends that you continue on your current medications as directed. Please refer to the Current Medication list given to you today.  *If you need a refill on your cardiac medications before your next appointment, please call your pharmacy*   Lab Work:  None ordered  Testing/Procedures:  None ordered   Follow-Up: At [facility], you and your health needs are our priority.  As part of our continuing mission to provide you with exceptional heart care, we have created designated Provider Care Teams.  These Care Teams include your primary Cardiologist (physician) and Advanced Practice Providers (APPs -  Physician Assistants and Nurse Practitioners) who all work together to provide you with the care you need, when you need it.  We recommend signing up for the patient portal called "MyChart".  Sign up information is provided on this After Visit Summary.  MyChart is used to connect with patients for Virtual Visits (Telemedicine).  Patients are able to view lab/test results, encounter notes, upcoming appointments, etc.  Non-urgent messages can be sent to your provider as well.   To learn more about what you can do with MyChart, go to [email or web address].    Your next appointment:   6 month(s)  The format for your next appointment:   In Person  Provider:   You may see Nelva Bush, MD or one of the following Advanced Practice Providers on your designated Care Team:   Murray Hodgkins, NP Christell Faith, PA-C Cadence Kathlen Mody, PA-C{   Important Information About Sugar

## 2021-08-22 NOTE — Progress Notes (Signed)
Follow-up Outpatient Visit Date: 08/22/2021  Primary Care Provider: Albina Billet, MD [address]   [place] [address]  Chief Complaint: Follow-up atrial fibrillation, tachybrady syndrome, and HFpEF  HPI:  Ms. Emrich is a 81 y.o. 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  She was last seen in our office by Tarri Glenn, [place], in March.  She had just undergone AV node ablation and permanent pacemaker implantation by Dr. Quentin Ore due to symptomatic atrial fibrillation that was difficult to control.  She was also evaluated for Watchman but was not a good candidate due to inappropriate left atrial appendage anatomy.  She has not been on anticoagulation given history of GI bleeding with esophageal varices.  At her last visit, she was feeling better with more energy.  No medication changes or additional testing were recommended.  Today, Ms. Warbington reports that she continues to feel well from a heart standpoint with significantly more energy since undergoing pacemaker placement.  She also denies chest pain, shortness of breath, palpitations, lightheadedness, and edema.  She continues to have some stomach discomfort and bowel issues.  She is scheduled for follow-up with Dr. Allen Norris at the [facility] of the month.  She denies bleeding, including melena, hematochezia, and hematemesis.  She also denies neurologic changes.  She happily reports that she has lost weight (about 10 pounds since April) through dietary changes.  --------------------------------------------------------------------------------------------------  Past  Medical History:  Diagnosis Date   Abdominal pain    Allergy    Cancer ([place]) 2017   breast cancer- Left   Cataract    CHF (congestive heart failure) (HCC)    Collagenous colitis    Diabetes mellitus without complication (HCC)    Diarrhea    Diverticulosis    Fatty liver disease, nonalcoholic    Fibrocystic breast    GERD (gastroesophageal reflux disease)    Heart murmur    Hyperlipidemia    Hypertension    Hypothyroidism    IBS (irritable bowel syndrome)    IDA (iron deficiency anemia)    Personal history of radiation therapy 2017   LEFT BREAST CA   PONV (postoperative nausea and vomiting)    Sleep apnea    C-Pap   Past Surgical History:  Procedure Laterality Date   ABDOMINAL HYSTERECTOMY     [place] bso   ABDOMINAL SURGERY     APPENDECTOMY     AV NODE ABLATION N/A 05/17/2021   Procedure: AV NODE ABLATION;  Surgeon: Vickie Epley, MD;  Location: [facility];  Service: Cardiovascular;  Laterality: N/A;   BREAST BIOPSY Bilateral 2016   negative   BREAST 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/2015   DCIS, papillary carcinoma in situ   BREAST BIOPSY Left 05/27/2016   BENIGN MAMMARY EPITHELIUM   BREAST BIOPSY Left 11/20/2017   affirm bx x clip BENIGN MAMMARY EPITHELIUM CONSISTENT WITH RAD THERAPY   BREAST EXCISIONAL BIOPSY Right    NEG 1980's   BREAST LUMPECTOMY Left 10/17/2015   DCIS and papillary carcinoma insitu, clear margins   CARDIAC SURGERY     has replacement valve   CATARACT EXTRACTION Right    CATARACT EXTRACTION W/PHACO Left 01/16/2021   Procedure: CATARACT EXTRACTION PHACO AND INTRAOCULAR  LENS PLACEMENT (IOC) LEFT DIABETIC 8.46 00:59.8;  Surgeon: Birder Robson, MD;  Location: [facility];  Service: Ophthalmology;  Laterality: Left;   CHOLECYSTECTOMY     COLONOSCOPY WITH PROPOFOL N/A 03/11/2016   Procedure: COLONOSCOPY WITH PROPOFOL;  Surgeon: Manya Silvas, MD;  Location: [facility];  Service: Endoscopy;  Laterality: N/A;   COLONOSCOPY WITH PROPOFOL  N/A 02/18/2020   Procedure: COLONOSCOPY WITH PROPOFOL;  Surgeon: [name], MD;  Location: [facility] ENDOSCOPY;  Service: Endoscopy;  Laterality: N/A;   ESOPHAGOGASTRODUODENOSCOPY (EGD) WITH PROPOFOL N/A 03/11/2016   Procedure: ESOPHAGOGASTRODUODENOSCOPY (EGD) WITH PROPOFOL;  Surgeon: Manya Silvas, MD;  Location: [facility] ENDOSCOPY;  Service: Endoscopy;  Laterality: N/A;   ESOPHAGOGASTRODUODENOSCOPY (EGD) WITH PROPOFOL N/A 02/18/2020   Procedure: ESOPHAGOGASTRODUODENOSCOPY (EGD) WITH PROPOFOL;  Surgeon: [name], MD;  Location: [facility] ENDOSCOPY;  Service: Endoscopy;  Laterality: N/A;   ESOPHAGOGASTRODUODENOSCOPY (EGD) WITH PROPOFOL N/A 04/25/2020   Procedure: ESOPHAGOGASTRODUODENOSCOPY (EGD) WITH PROPOFOL;  Surgeon: [name], MD;  Location: [facility] ENDOSCOPY;  Service: Endoscopy;  Laterality: N/A;   ESOPHAGOGASTRODUODENOSCOPY (EGD) WITH PROPOFOL N/A 05/23/2020   Procedure: ESOPHAGOGASTRODUODENOSCOPY (EGD) WITH PROPOFOL;  Surgeon: [name], MD;  Location: [facility] ENDOSCOPY;  Service: Endoscopy;  Laterality: N/A;   FINGER ARTHROSCOPY WITH CARPOMETACARPEL ([place]) ARTHROPLASTY Right 09/03/2018   Procedure: CARPOMETACARPEL [facility]) ARTHROPLASTY RIGHT THUMB;  Surgeon: Hessie Knows, MD;  Location: [facility] OR[facility];  Service: Orthopedics;  Laterality: Right;   GANGLION CYST EXCISION Right 09/03/2018   Procedure: REMOVAL GANGLION OF WRIST;  Surgeon: Hessie Knows, MD;  Location: [facility] OR[facility];  Service: Orthopedics;  Laterality: Right;   HARDWARE REMOVAL Right 09/03/2018   Procedure: HARDWARE REMOVAL RIGHT THUMB;  Surgeon: Hessie Knows, MD;  Location: [facility] OR[facility];  Service: Orthopedics;  Laterality: Right;  staple removed   JOINT REPLACEMENT Left    TKR   left sinusplasty      MASTECTOMY, PARTIAL Left 10/17/2015   Procedure: MASTECTOMY PARTIAL REVISION;  Surgeon: Leonie Green, MD;  Location: [facility];  Service: General;  Laterality: Left;   PACEMAKER IMPLANT N/A 03/23/2021   Procedure: PACEMAKER IMPLANT;  Surgeon:  Vickie Epley, MD;  Location: [facility];  Service: Cardiovascular;  Laterality: N/A;   PARTIAL MASTECTOMY WITH NEEDLE LOCALIZATION Left 09/29/2015   Procedure: PARTIAL MASTECTOMY WITH NEEDLE LOCALIZATION;  Surgeon: Leonie Green, MD;  Location: [facility];  Service: General;  Laterality: Left;   TOTAL ABDOMINAL HYSTERECTOMY W/ BILATERAL SALPINGOOPHORECTOMY      Current Meds  Medication Sig   acetaminophen (TYLENOL) 325 MG tablet Take 1-2 tablets (325-650 mg total) by mouth every 4 (four) hours as needed for mild pain.   benzonatate (TESSALON) 200 MG capsule Take 200 mg by mouth 3 (three) times daily as needed for cough.   Cholecalciferol (VITAMIN D) 50 MCG (2000 UT) CAPS Take 2,000 Units by mouth daily.   clobetasol ointment (TEMOVATE) 8.85 % Apply 1 application topically as needed (vaginal irritation).   clotrimazole (LOTRIMIN) 1 % cream Apply 1 Application topically 2 (two) times daily as needed.   cyanocobalamin (,VITAMIN B-12,) 1000 MCG/ML injection 1,000 mcg every 30 (thirty) days.   dicyclomine (BENTYL) 10 MG capsule Take 10 mg by mouth in the morning, at noon, and at bedtime.   docusate sodium (COLACE) 100 MG capsule Take 100 mg by mouth 3 (three) times daily as needed for moderate constipation.   EPINEPHrine 0.3 mg/0.3 mL IJ SOAJ injection Inject 0.3 mg into the muscle as needed for anaphylaxis.   esomeprazole (NEXIUM) 40 MG capsule Take  40 mg by mouth 2 (two) times daily before a meal.    estradiol (ESTRACE) 0.1 MG/GM vaginal cream Place 1 Applicatorful vaginally daily as needed (irritation).   fenofibrate 160 MG tablet Take 160 mg by mouth at bedtime.   gabapentin (NEURONTIN) 300 MG capsule Take 300 mg by mouth at bedtime.    hydrOXYzine (ATARAX/VISTARIL) 25 MG tablet Take 25 mg by mouth every 6 (six) hours as needed for anxiety, itching, nausea or vomiting.   Insulin Degludec (TRESIBA) 100 UNIT/ML SOLN Inject 160 Units into the skin at bedtime.   levocetirizine  (XYZAL) 5 MG tablet Take 5 mg by mouth every evening.   levothyroxine (SYNTHROID, LEVOTHROID) 75 MCG tablet Take 75 mcg by mouth daily before breakfast.    magnesium oxide (MAG-OX) 400 MG tablet Take 400 mg by mouth 2 (two) times daily.    nadolol (CORGARD) 20 MG tablet Take 10 mg by mouth daily.   NOVOLOG FLEXPEN 100 UNIT/ML FlexPen Inject 48-68 Units into the skin 3 (three) times daily with meals. Based on sliding scale   potassium chloride SA (K-DUR,KLOR-CON) 20 MEQ tablet Take 20 mEq by mouth 2 (two) times daily.    saccharomyces boulardii (FLORASTOR) 250 MG capsule Take 250 mg by mouth 2 (two) times daily.    simvastatin (ZOCOR) 20 MG tablet Take 20 mg by mouth at bedtime.   spironolactone (ALDACTONE) 25 MG tablet Take 12.5 mg by mouth daily.   tirzepatide (MOUNJARO) 12.5 MG/0.5ML Pen Inject 12.5 mg into the skin once a week.   torsemide (DEMADEX) 20 MG tablet TAKE 1 TABLET BY MOUTH EVERY DAY   valACYclovir (VALTREX) 500 MG tablet Take 500 mg by mouth 2 (two) times daily as needed (fever blisters).    Allergies: Codeine, Demeclocycline, Demerol [meperidine], Hydrocodone, Oxycodone, Pentazocine, Tetracyclines & related, and Fentanyl  Social History   Tobacco Use   Smoking status: Never   Smokeless tobacco: Never  Vaping Use   Vaping Use: Never used  Substance Use Topics   Alcohol use: No    Alcohol/week: 0.0 standard drinks of alcohol   Drug use: No    Family History  Problem Relation Age of Onset   Breast cancer Paternal Grandmother    Colon cancer Father    Diabetes Sister    Diabetes Brother    Heart disease Brother    Prostate cancer Brother    Colon cancer Maternal Uncle    Prostate cancer Brother    Bladder Cancer Brother    Leukemia Mother        all   Ovarian cancer Neg Hx    Kidney cancer Neg Hx     Review of Systems: A 12-system review of systems was performed and was negative except as noted in the  HPI.  --------------------------------------------------------------------------------------------------  Physical Exam: BP 110/80 (BP Location: Left Arm, Patient Position: Sitting, Cuff Size: Normal)   Pulse 71   Ht 5' 3"  (1.6 m)   Wt 148 lb (67.1 kg)   LMP  (LMP Unknown)   SpO2 98%   BMI 26.22 kg/m   General:  NAD. Neck: No JVD or HJR. Lungs: Clear to auscultation bilaterally without wheezes or crackles. Heart: Regular rate and rhythm with 2/6 systolic murmur.  No rubs or gallops. Abdomen: Soft, nontender, nondistended. Extremities: No lower extremity edema.  EKG:  AV paced rhythm.  No significant change from prior tracing on 05/18/2021.  Lab Results  Component Value Date   WBC 2.4 (L) 05/28/2021   HGB 12.3 05/28/2021  HCT 36.6 05/28/2021   MCV 91.3 05/28/2021   PLT 81 (L) 05/28/2021    Lab Results  Component Value Date   NA 131 (L) 05/28/2021   K 4.0 05/28/2021   CL 95 (L) 05/28/2021   CO2 29 05/28/2021   BUN 22 05/28/2021   CREATININE 0.88 05/28/2021   GLUCOSE 162 (H) 05/28/2021   ALT 18 05/28/2021    Lab Results  Component Value Date   CHOL 160 11/30/2020   HDL 26 (L) 11/30/2020   LDLCALC 106 (H) 11/30/2020   TRIG 156 (H) 11/30/2020   CHOLHDL 6.2 (H) 11/30/2020    --------------------------------------------------------------------------------------------------  ASSESSMENT AND PLAN: Paroxysmal atrial fibrillation and tachybrady syndrome: Ms. Mcquown is doing very well following PPM placement and subsequent AV node ablation.  She has been intolerant of anticoagulation due to GI bleeding in the past and has unfavorable anatomy for Watchman placement.  We will continue to defer anticoagulation.  No medication changes today.  Chronic HFpEF: Ms. Marschner appears euvolemic today with NYHA class I-II symptoms.  Continue current doses of torsemide and spironolactone, which are also used for management of her cirrhosis.  Continue nadolol 10 mg daily with low normal  BP, unless Dr. Allen Norris feels like there would be a potential benefit with increasing the dose.  Cirrhosis and history of GI bleed: No further GI bleeding reported off anticoagulation.  Continue torsemide, spironolactone, and nadolol.  Follow-up with Dr. Allen Norris is scheduled for later this month.  Hyperlipidemia: Per Dr. Hall Busing.  Follow-up: Return to clinic in 6 months.  Nelva Bush, MD 08/22/2021 3:55 PM

## 2021-09-05 ENCOUNTER — Other Ambulatory Visit: Payer: Self-pay | Admitting: Internal Medicine

## 2021-09-10 ENCOUNTER — Encounter: Payer: Self-pay | Admitting: Gastroenterology

## 2021-09-10 ENCOUNTER — Ambulatory Visit (INDEPENDENT_AMBULATORY_CARE_PROVIDER_SITE_OTHER): Payer: Medicare PPO | Admitting: Gastroenterology

## 2021-09-10 VITALS — BP 123/69 | HR 70 | Temp 97.8°F | Ht 63.0 in | Wt 149.0 lb

## 2021-09-10 DIAGNOSIS — K7581 Nonalcoholic steatohepatitis (NASH): Secondary | ICD-10-CM

## 2021-09-10 DIAGNOSIS — K629 Disease of anus and rectum, unspecified: Secondary | ICD-10-CM

## 2021-09-10 NOTE — Progress Notes (Signed)
Primary Care Physician: Albina Billet, MD  Primary Gastroenterologist:  Dr. [name]  Chief Complaint  Patient presents with   Cirrhosis    Follow up    HPI: Annette Foster is a 81 y.o. female here for follow-up with a history of cirrhosis.  The patient had a CT scan done in April that showed:  1. Hepatic cirrhosis with splenomegaly, prominence of the portal vein, varices about the splenic hilum and about the GE junction and recanalization of the umbilical vein consistent with portal venous hypertension. 2. Mild generalized mesenteric edema as well as multiple enlarged mesenteric lymph nodes most consistent with misty mesentery in the settings of portal venous hypertension. 3.  Status post cholecystectomy. 4. Bowel loops are normal in caliber. Scattered colonic diverticuli without evidence of acute diverticulitis. No appreciable bowel wall thickening. 5.  Atherosclerotic disease of aorta and branch vessels. 6.  Degenerate disease of the lumbar spine.  And an alpha-fetoprotein that was normal.  The patient reports that she is taking a half of the pill of nadolol at the present time.  She had a colonoscopy in 2022 but now reports that the last few weeks she has felt like she is having to pass something in her rectum to get stool out.  She is concerned because of her family history of colon cancer.  Her last colonoscopy was in 2022.  Past Medical History:  Diagnosis Date   Abdominal pain    Allergy    Cancer ([place]) 2017   breast cancer- Left   Cataract    CHF (congestive heart failure) (HCC)    Collagenous colitis    Diabetes mellitus without complication (HCC)    Diarrhea    Diverticulosis    Fatty liver disease, nonalcoholic    Fibrocystic breast    GERD (gastroesophageal reflux disease)    Heart murmur    Hyperlipidemia    Hypertension    Hypothyroidism    IBS (irritable bowel syndrome)    IDA (iron deficiency anemia)    Personal history of radiation  therapy 2017   LEFT BREAST CA   PONV (postoperative nausea and vomiting)    Sleep apnea    C-Pap    Current Outpatient Medications  Medication Sig Dispense Refill   acetaminophen (TYLENOL) 325 MG tablet Take 1-2 tablets (325-650 mg total) by mouth every 4 (four) hours as needed for mild pain.     benzonatate (TESSALON) 200 MG capsule Take 200 mg by mouth 3 (three) times daily as needed for cough.     Cholecalciferol (VITAMIN D) 50 MCG (2000 UT) CAPS Take 2,000 Units by mouth daily.     clobetasol ointment (TEMOVATE) 1.61 % Apply 1 application topically as needed (vaginal irritation).     clotrimazole (LOTRIMIN) 1 % cream Apply 1 Application topically 2 (two) times daily as needed.     cyanocobalamin (,VITAMIN B-12,) 1000 MCG/ML injection 1,000 mcg every 30 (thirty) days.     dicyclomine (BENTYL) 10 MG capsule Take 10 mg by mouth in the morning, at noon, and at bedtime.     docusate sodium (COLACE) 100 MG capsule Take 100 mg by mouth 3 (three) times daily as needed for moderate constipation.     EPINEPHrine 0.3 mg/0.3 mL IJ SOAJ injection Inject 0.3 mg into the muscle as needed for anaphylaxis.     esomeprazole (NEXIUM) 40 MG capsule Take 40 mg by mouth 2 (two) times daily before a meal.      estradiol (ESTRACE)  0.1 MG/GM vaginal cream Place 1 Applicatorful vaginally daily as needed (irritation).     fenofibrate 160 MG tablet Take 160 mg by mouth at bedtime.     gabapentin (NEURONTIN) 300 MG capsule Take 300 mg by mouth at bedtime.      hydrOXYzine (ATARAX/VISTARIL) 25 MG tablet Take 25 mg by mouth every 6 (six) hours as needed for anxiety, itching, nausea or vomiting.     Insulin Degludec (TRESIBA) 100 UNIT/ML SOLN Inject 160 Units into the skin at bedtime.     levocetirizine (XYZAL) 5 MG tablet Take 5 mg by mouth every evening.     levothyroxine (SYNTHROID, LEVOTHROID) 75 MCG tablet Take 75 mcg by mouth daily before breakfast.      magnesium oxide (MAG-OX) 400 MG tablet Take 400 mg by  mouth 2 (two) times daily.      nadolol (CORGARD) 20 MG tablet Take 0.5 tablets (10 mg total) by mouth daily. 45 tablet 0   NOVOLOG FLEXPEN 100 UNIT/ML FlexPen Inject 48-68 Units into the skin 3 (three) times daily with meals. Based on sliding scale     potassium chloride SA (K-DUR,KLOR-CON) 20 MEQ tablet Take 20 mEq by mouth 2 (two) times daily.      saccharomyces boulardii (FLORASTOR) 250 MG capsule Take 250 mg by mouth 2 (two) times daily.      simvastatin (ZOCOR) 20 MG tablet Take 20 mg by mouth at bedtime.     spironolactone (ALDACTONE) 25 MG tablet TAKE 1/2 TABLET BY MOUTH EVERY DAY 45 tablet 1   tirzepatide (MOUNJARO) 12.5 MG/0.5ML Pen Inject 12.5 mg into the skin once a week.     torsemide (DEMADEX) 20 MG tablet TAKE 1 TABLET BY MOUTH EVERY DAY 90 tablet 0   valACYclovir (VALTREX) 500 MG tablet Take 500 mg by mouth 2 (two) times daily as needed (fever blisters).  1   No current facility-administered medications for this visit.    Allergies as of 09/10/2021 - Review Complete 09/10/2021  Allergen Reaction Noted   Codeine Itching and Nausea And Vomiting 06/28/2014   Demeclocycline Rash 04/10/2012   Demerol [meperidine] Itching and Nausea And Vomiting 06/28/2014   Hydrocodone Itching and Nausea And Vomiting 08/24/2018   Oxycodone Itching and Nausea And Vomiting 09/26/2015   Pentazocine Itching and Nausea And Vomiting 04/10/2012   Tetracyclines & related Rash 06/28/2014   Fentanyl Itching and Nausea And Vomiting 06/28/2014    ROS:  General: Negative for anorexia, weight loss, fever, chills, fatigue, weakness. ENT: Negative for hoarseness, difficulty swallowing , nasal congestion. CV: Negative for chest pain, angina, palpitations, dyspnea on exertion, peripheral edema.  Respiratory: Negative for dyspnea at rest, dyspnea on exertion, cough, sputum, wheezing.  GI: See history of present illness. GU:  Negative for dysuria, hematuria, urinary incontinence, urinary frequency, nocturnal  urination.  Endo: Negative for unusual weight change.    Physical Examination:   BP 123/69   Pulse 70   Temp 97.8 F (36.6 C) (Oral)   Wt 149 lb (67.6 kg)   LMP  (LMP Unknown)   BMI 26.39 kg/m   General: Well-nourished, well-developed in no acute distress.  Eyes: No icterus. Conjunctivae pink. Rectal: There is a large irregularity in the rectum that may have been outside of the intestines and good visualization was not obtained despite using a anoscope. Neuro: Alert and oriented x 3.  Grossly intact. Skin: Warm and dry, no jaundice.   Psych: Alert and cooperative, normal mood and affect.  Labs:    Imaging  Studies: No results found.  Assessment and Plan:   Annette Foster is a 81 y.o. y/o female who comes in with a history of Annette Foster cirrhosis.  The patient will increase her nadolol to 1 whole pill a day.  The patient will also be set up for a flexible sigmoidoscopy to look at the area in her rectum that is palpable on rectal exam.  The patient has been explained the plan and agrees with it.     [name], MD. Mar[facility] Regal    Note: This dictation was prepared with Dragon dictation along with smaller phrase technology. Any transcriptional errors that result from this process are unintentional.

## 2021-09-10 NOTE — H&P (View-Only) (Signed)
Primary Care Physician: Albina Billet, MD  Primary Gastroenterologist:  Dr. [name]  Chief Complaint  Patient presents with   Cirrhosis    Follow up    HPI: Annette Foster is a 81 y.o. female here for follow-up with a history of cirrhosis.  The patient had a CT scan done in April that showed:  1. Hepatic cirrhosis with splenomegaly, prominence of the portal vein, varices about the splenic hilum and about the GE junction and recanalization of the umbilical vein consistent with portal venous hypertension. 2. Mild generalized mesenteric edema as well as multiple enlarged mesenteric lymph nodes most consistent with misty mesentery in the settings of portal venous hypertension. 3.  Status post cholecystectomy. 4. Bowel loops are normal in caliber. Scattered colonic diverticuli without evidence of acute diverticulitis. No appreciable bowel wall thickening. 5.  Atherosclerotic disease of aorta and branch vessels. 6.  Degenerate disease of the lumbar spine.  And an alpha-fetoprotein that was normal.  The patient reports that she is taking a half of the pill of nadolol at the present time.  She had a colonoscopy in 2022 but now reports that the last few weeks she has felt like she is having to pass something in her rectum to get stool out.  She is concerned because of her family history of colon cancer.  Her last colonoscopy was in 2022.  Past Medical History:  Diagnosis Date   Abdominal pain    Allergy    Cancer ([facility]) 2017   breast cancer- Left   Cataract    CHF (congestive heart failure) (HCC)    Collagenous colitis    Diabetes mellitus without complication (HCC)    Diarrhea    Diverticulosis    Fatty liver disease, nonalcoholic    Fibrocystic breast    GERD (gastroesophageal reflux disease)    Heart murmur    Hyperlipidemia    Hypertension    Hypothyroidism    IBS (irritable bowel syndrome)    IDA (iron deficiency anemia)    Personal history of radiation  therapy 2017   LEFT BREAST CA   PONV (postoperative nausea and vomiting)    Sleep apnea    C-Pap    Current Outpatient Medications  Medication Sig Dispense Refill   acetaminophen (TYLENOL) 325 MG tablet Take 1-2 tablets (325-650 mg total) by mouth every 4 (four) hours as needed for mild pain.     benzonatate (TESSALON) 200 MG capsule Take 200 mg by mouth 3 (three) times daily as needed for cough.     Cholecalciferol (VITAMIN D) 50 MCG (2000 UT) CAPS Take 2,000 Units by mouth daily.     clobetasol ointment (TEMOVATE) 8.24 % Apply 1 application topically as needed (vaginal irritation).     clotrimazole (LOTRIMIN) 1 % cream Apply 1 Application topically 2 (two) times daily as needed.     cyanocobalamin (,VITAMIN B-12,) 1000 MCG/ML injection 1,000 mcg every 30 (thirty) days.     dicyclomine (BENTYL) 10 MG capsule Take 10 mg by mouth in the morning, at noon, and at bedtime.     docusate sodium (COLACE) 100 MG capsule Take 100 mg by mouth 3 (three) times daily as needed for moderate constipation.     EPINEPHrine 0.3 mg/0.3 mL IJ SOAJ injection Inject 0.3 mg into the muscle as needed for anaphylaxis.     esomeprazole (NEXIUM) 40 MG capsule Take 40 mg by mouth 2 (two) times daily before a meal.      estradiol (ESTRACE)  0.1 MG/GM vaginal cream Place 1 Applicatorful vaginally daily as needed (irritation).     fenofibrate 160 MG tablet Take 160 mg by mouth at bedtime.     gabapentin (NEURONTIN) 300 MG capsule Take 300 mg by mouth at bedtime.      hydrOXYzine (ATARAX/VISTARIL) 25 MG tablet Take 25 mg by mouth every 6 (six) hours as needed for anxiety, itching, nausea or vomiting.     Insulin Degludec (TRESIBA) 100 UNIT/ML SOLN Inject 160 Units into the skin at bedtime.     levocetirizine (XYZAL) 5 MG tablet Take 5 mg by mouth every evening.     levothyroxine (SYNTHROID, LEVOTHROID) 75 MCG tablet Take 75 mcg by mouth daily before breakfast.      magnesium oxide (MAG-OX) 400 MG tablet Take 400 mg by  mouth 2 (two) times daily.      nadolol (CORGARD) 20 MG tablet Take 0.5 tablets (10 mg total) by mouth daily. 45 tablet 0   NOVOLOG FLEXPEN 100 UNIT/ML FlexPen Inject 48-68 Units into the skin 3 (three) times daily with meals. Based on sliding scale     potassium chloride SA (K-DUR,KLOR-CON) 20 MEQ tablet Take 20 mEq by mouth 2 (two) times daily.      saccharomyces boulardii (FLORASTOR) 250 MG capsule Take 250 mg by mouth 2 (two) times daily.      simvastatin (ZOCOR) 20 MG tablet Take 20 mg by mouth at bedtime.     spironolactone (ALDACTONE) 25 MG tablet TAKE 1/2 TABLET BY MOUTH EVERY DAY 45 tablet 1   tirzepatide (MOUNJARO) 12.5 MG/0.5ML Pen Inject 12.5 mg into the skin once a week.     torsemide (DEMADEX) 20 MG tablet TAKE 1 TABLET BY MOUTH EVERY DAY 90 tablet 0   valACYclovir (VALTREX) 500 MG tablet Take 500 mg by mouth 2 (two) times daily as needed (fever blisters).  1   No current facility-administered medications for this visit.    Allergies as of 09/10/2021 - Review Complete 09/10/2021  Allergen Reaction Noted   Codeine Itching and Nausea And Vomiting 06/28/2014   Demeclocycline Rash 04/10/2012   Demerol [meperidine] Itching and Nausea And Vomiting 06/28/2014   Hydrocodone Itching and Nausea And Vomiting 08/24/2018   Oxycodone Itching and Nausea And Vomiting 09/26/2015   Pentazocine Itching and Nausea And Vomiting 04/10/2012   Tetracyclines & related Rash 06/28/2014   Fentanyl Itching and Nausea And Vomiting 06/28/2014    ROS:  General: Negative for anorexia, weight loss, fever, chills, fatigue, weakness. ENT: Negative for hoarseness, difficulty swallowing , nasal congestion. CV: Negative for chest pain, angina, palpitations, dyspnea on exertion, peripheral edema.  Respiratory: Negative for dyspnea at rest, dyspnea on exertion, cough, sputum, wheezing.  GI: See history of present illness. GU:  Negative for dysuria, hematuria, urinary incontinence, urinary frequency, nocturnal  urination.  Endo: Negative for unusual weight change.    Physical Examination:   BP 123/69   Pulse 70   Temp 97.8 F (36.6 C) (Oral)   Wt 149 lb (67.6 kg)   LMP  (LMP Unknown)   BMI 26.39 kg/m   General: Well-nourished, well-developed in no acute distress.  Eyes: No icterus. Conjunctivae pink. Rectal: There is a large irregularity in the rectum that may have been outside of the intestines and good visualization was not obtained despite using a anoscope. Neuro: Alert and oriented x 3.  Grossly intact. Skin: Warm and dry, no jaundice.   Psych: Alert and cooperative, normal mood and affect.  Labs:    Imaging  Studies: No results found.  Assessment and Plan:   Annette Foster is a 81 y.o. y/o female who comes in with a history of Annette Foster cirrhosis.  The patient will increase her nadolol to 1 whole pill a day.  The patient will also be set up for a flexible sigmoidoscopy to look at the area in her rectum that is palpable on rectal exam.  The patient has been explained the plan and agrees with it.     [name], MD. Marval Regal    Note: This dictation was prepared with Dragon dictation along with smaller phrase technology. Any transcriptional errors that result from this process are unintentional.

## 2021-09-10 NOTE — Addendum Note (Signed)
Addended by: Lurlean Nanny on: 09/10/2021 04:38 PM   Modules accepted: Orders

## 2021-09-11 NOTE — Addendum Note (Signed)
Addended by: Lurlean Nanny on: 09/11/2021 09:18 AM   Modules accepted: Orders

## 2021-09-14 ENCOUNTER — Inpatient Hospital Stay (HOSPITAL_BASED_OUTPATIENT_CLINIC_OR_DEPARTMENT_OTHER): Payer: Medicare PPO | Admitting: Internal Medicine

## 2021-09-14 ENCOUNTER — Encounter: Payer: Self-pay | Admitting: Internal Medicine

## 2021-09-14 ENCOUNTER — Encounter: Payer: Self-pay | Admitting: Anesthesiology

## 2021-09-14 ENCOUNTER — Ambulatory Visit
Admission: RE | Admit: 2021-09-14 | Discharge: 2021-09-14 | Disposition: A | Discharge status: Home / Self Care | Payer: Medicare PPO | Attending: Gastroenterology | Admitting: Gastroenterology

## 2021-09-14 ENCOUNTER — Encounter
Admission: RE | Disposition: A | Discharge status: Home / Self Care | Payer: Self-pay | Source: Home / Self Care | Attending: Gastroenterology

## 2021-09-14 ENCOUNTER — Inpatient Hospital Stay: Payer: Medicare PPO

## 2021-09-14 ENCOUNTER — Inpatient Hospital Stay: Payer: Medicare PPO | Attending: Internal Medicine

## 2021-09-14 VITALS — BP 117/63 | HR 68 | Temp 96.0°F | Resp 17

## 2021-09-14 DIAGNOSIS — K7581 Nonalcoholic steatohepatitis (NASH): Secondary | ICD-10-CM | POA: Insufficient documentation

## 2021-09-14 DIAGNOSIS — E039 Hypothyroidism, unspecified: Secondary | ICD-10-CM | POA: Insufficient documentation

## 2021-09-14 DIAGNOSIS — D6959 Other secondary thrombocytopenia: Secondary | ICD-10-CM | POA: Insufficient documentation

## 2021-09-14 DIAGNOSIS — R001 Bradycardia, unspecified: Secondary | ICD-10-CM | POA: Insufficient documentation

## 2021-09-14 DIAGNOSIS — D61818 Other pancytopenia: Secondary | ICD-10-CM

## 2021-09-14 DIAGNOSIS — K922 Gastrointestinal hemorrhage, unspecified: Secondary | ICD-10-CM | POA: Insufficient documentation

## 2021-09-14 DIAGNOSIS — I5032 Chronic diastolic (congestive) heart failure: Secondary | ICD-10-CM | POA: Diagnosis not present

## 2021-09-14 DIAGNOSIS — E611 Iron deficiency: Secondary | ICD-10-CM

## 2021-09-14 DIAGNOSIS — Z800 Family history of malignant neoplasm of digestive organs: Secondary | ICD-10-CM | POA: Diagnosis not present

## 2021-09-14 DIAGNOSIS — I110 Hypertensive heart disease with heart failure: Secondary | ICD-10-CM | POA: Insufficient documentation

## 2021-09-14 DIAGNOSIS — E119 Type 2 diabetes mellitus without complications: Secondary | ICD-10-CM | POA: Insufficient documentation

## 2021-09-14 DIAGNOSIS — K766 Portal hypertension: Secondary | ICD-10-CM | POA: Insufficient documentation

## 2021-09-14 DIAGNOSIS — Z79899 Other long term (current) drug therapy: Secondary | ICD-10-CM | POA: Diagnosis not present

## 2021-09-14 DIAGNOSIS — I480 Paroxysmal atrial fibrillation: Secondary | ICD-10-CM | POA: Diagnosis not present

## 2021-09-14 DIAGNOSIS — Z86000 Personal history of in-situ neoplasm of breast: Secondary | ICD-10-CM | POA: Insufficient documentation

## 2021-09-14 DIAGNOSIS — K7460 Unspecified cirrhosis of liver: Secondary | ICD-10-CM | POA: Insufficient documentation

## 2021-09-14 DIAGNOSIS — K6289 Other specified diseases of anus and rectum: Secondary | ICD-10-CM | POA: Diagnosis present

## 2021-09-14 DIAGNOSIS — K219 Gastro-esophageal reflux disease without esophagitis: Secondary | ICD-10-CM | POA: Diagnosis not present

## 2021-09-14 DIAGNOSIS — K629 Disease of anus and rectum, unspecified: Secondary | ICD-10-CM | POA: Diagnosis not present

## 2021-09-14 DIAGNOSIS — R161 Splenomegaly, not elsewhere classified: Secondary | ICD-10-CM | POA: Diagnosis not present

## 2021-09-14 DIAGNOSIS — Z794 Long term (current) use of insulin: Secondary | ICD-10-CM | POA: Diagnosis not present

## 2021-09-14 DIAGNOSIS — S0990XA Unspecified injury of head, initial encounter: Secondary | ICD-10-CM | POA: Insufficient documentation

## 2021-09-14 DIAGNOSIS — D500 Iron deficiency anemia secondary to blood loss (chronic): Secondary | ICD-10-CM | POA: Diagnosis present

## 2021-09-14 HISTORY — PX: FLEXIBLE SIGMOIDOSCOPY: SHX5431

## 2021-09-14 LAB — CBC WITH DIFFERENTIAL/PLATELET
Abs Immature Granulocytes: 0.01 10*3/uL (ref 0.00–0.07)
Basophils Absolute: 0 10*3/uL (ref 0.0–0.1)
Basophils Relative: 1 %
Eosinophils Absolute: 0.1 10*3/uL (ref 0.0–0.5)
Eosinophils Relative: 3 %
HCT: 33.8 % — ABNORMAL LOW (ref 36.0–46.0)
Hemoglobin: 11.6 g/dL — ABNORMAL LOW (ref 12.0–15.0)
Immature Granulocytes: 1 %
Lymphocytes Relative: 35 %
Lymphs Abs: 0.6 10*3/uL — ABNORMAL LOW (ref 0.7–4.0)
MCH: 31.1 pg (ref 26.0–34.0)
MCHC: 34.3 g/dL (ref 30.0–36.0)
MCV: 90.6 fL (ref 80.0–100.0)
Monocytes Absolute: 0.2 10*3/uL (ref 0.1–1.0)
Monocytes Relative: 10 %
Neutro Abs: 0.9 10*3/uL — ABNORMAL LOW (ref 1.7–7.7)
Neutrophils Relative %: 50 %
Platelets: 67 10*3/uL — ABNORMAL LOW (ref 150–400)
RBC: 3.73 MIL/uL — ABNORMAL LOW (ref 3.87–5.11)
RDW: 13.8 % (ref 11.5–15.5)
WBC: 1.8 10*3/uL — ABNORMAL LOW (ref 4.0–10.5)
nRBC: 0 % (ref 0.0–0.2)

## 2021-09-14 LAB — COMPREHENSIVE METABOLIC PANEL
ALT: 20 U/L (ref 0–44)
AST: 29 U/L (ref 15–41)
Albumin: 4.1 g/dL (ref 3.5–5.0)
Alkaline Phosphatase: 52 U/L (ref 38–126)
Anion gap: 8 (ref 5–15)
BUN: 15 mg/dL (ref 8–23)
CO2: 28 mmol/L (ref 22–32)
Calcium: 9.1 mg/dL (ref 8.9–10.3)
Chloride: 100 mmol/L (ref 98–111)
Creatinine, Ser: 0.71 mg/dL (ref 0.44–1.00)
GFR, Estimated: >60 mL/min (ref >60)
Glucose, Bld: 129 mg/dL — ABNORMAL HIGH (ref 70–99)
Potassium: 3.8 mmol/L (ref 3.5–5.1)
Sodium: 136 mmol/L (ref 135–145)
Total Bilirubin: 1.1 mg/dL (ref 0.3–1.2)
Total Protein: 8.1 g/dL (ref 6.5–8.1)

## 2021-09-14 LAB — IRON AND TIBC
Iron: 112 ug/dL (ref 28–170)
Saturation Ratios: 29 % (ref 10.4–31.8)
TIBC: 386 ug/dL (ref 250–450)
UIBC: 274 ug/dL

## 2021-09-14 LAB — GLUCOSE, CAPILLARY: Glucose-Capillary: 154 mg/dL — ABNORMAL HIGH (ref 70–99)

## 2021-09-14 LAB — FERRITIN: Ferritin: 127 ng/mL (ref 11–307)

## 2021-09-14 SURGERY — SIGMOIDOSCOPY, FLEXIBLE

## 2021-09-14 MED ORDER — SODIUM CHLORIDE 0.9 % IV SOLN
200.0000 mg | Freq: Once | INTRAVENOUS | Status: AC
  Administered 2021-09-14: 200 mg via INTRAVENOUS
  Filled 2021-09-14: qty 200

## 2021-09-14 MED ORDER — SODIUM CHLORIDE 0.9 % IV SOLN
Freq: Once | INTRAVENOUS | Status: AC
  Filled 2021-09-14: qty 250

## 2021-09-14 MED ORDER — SODIUM CHLORIDE 0.9 % IV SOLN: INTRAVENOUS | Status: DC

## 2021-09-14 NOTE — Progress Notes (Signed)
[facility] OFFICE PROGRESS NOTE  Patient Care Team: Albina Billet, MD as PCP - General (Internal Medicine) End, Harrell Gave, MD as PCP - Cardiology (Cardiology) Vickie Epley, MD as PCP - Electrophysiology (Cardiology) Cammie Sickle, MD as Consulting Physician (Internal Medicine)   Cancer Staging  No matching staging information was found for the patient.   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 OCT 2018- BMBx- MILD dyspoiesis; variable cellularity [10-50%]; FISH/Cytogenetics-NORMAL; F-One-NGS-declined by insurance.    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 Brandon/uro-gyne,[facility]]; Dr.Wohl/  # HRT [clinical trial thru [facility]; stopped July 2017 ]; CPAP   DIAGNOSIS: Left breast DCIS  STAGE:    0     ;GOALS: cure  CURRENT/MOST RECENT THERAPY: surveillaince    Ductal carcinoma in situ (DCIS) of left breast    INTERVAL HISTORY: Alone.  Ambulating independently.  Annette Foster 81 y.o.  female pleasant patient above history of DCIS; cirrhosis portal hypertension; A. fib and pancytopenia is here for follow-up.  In interim patient noted to have a rectal mass especially while defecating.  Denies any obvious blood in stools or black stools.  She underwent further evaluation with Dr.Wohl with a sigmoidoscopy this morning.  She is currently awaiting a CT scan.  Denies any swelling in the legs.  Denies any shortness of breath or cough.  Complains of tiredness.  Denies any ongoing bleeding.   Review of Systems  Constitutional:  Positive for malaise/fatigue. Negative for chills, diaphoresis, fever  and weight loss.  HENT:  Negative for nosebleeds and sore throat.   Eyes:  Negative for double vision.  Respiratory:  Negative for cough, hemoptysis, sputum production, shortness of breath and wheezing.   Cardiovascular:  Negative for chest pain, palpitations, orthopnea and leg swelling.  Gastrointestinal:  Negative for abdominal pain, blood in stool, constipation, diarrhea, heartburn, melena, nausea and vomiting.  Musculoskeletal:  Positive for back pain and joint pain.  Skin: Negative.  Negative for itching and rash.  Neurological:  Negative for dizziness, tingling, focal weakness, weakness and headaches.  Endo/Heme/Allergies:  Does not bruise/bleed easily.  Psychiatric/Behavioral:  Negative for depression. The patient is not nervous/anxious and does not have insomnia.       PAST MEDICAL HISTORY :  Past Medical History:  Diagnosis Date   Abdominal pain    Allergy    Cancer ([place]) 2017   breast cancer- Left   Cataract    CHF (congestive heart failure) (HCC)    Collagenous colitis    Diabetes mellitus without complication (HCC)    Diarrhea    Diverticulosis    Fatty liver disease, nonalcoholic    Fibrocystic breast    GERD (gastroesophageal reflux disease)    Heart murmur    Hyperlipidemia    Hypertension    Hypothyroidism    IBS (irritable bowel syndrome)    IDA (iron deficiency anemia)    Personal history of radiation therapy 2017   LEFT BREAST CA   PONV (postoperative nausea and vomiting)    Sleep apnea    C-Pap    PAST SURGICAL HISTORY :   Past Surgical History:  Procedure Laterality Date   ABDOMINAL HYSTERECTOMY     [place]  bso   ABDOMINAL SURGERY     APPENDECTOMY     AV NODE ABLATION N/A 05/17/2021   Procedure: AV NODE ABLATION;  Surgeon: Vickie Epley, MD;  Location: [facility];  Service: Cardiovascular;  Laterality: N/A;   BREAST BIOPSY Bilateral 2016   negative   BREAST 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/2015   DCIS, papillary carcinoma in situ   BREAST BIOPSY  Left 05/27/2016   BENIGN MAMMARY EPITHELIUM   BREAST BIOPSY Left 11/20/2017   affirm bx x clip BENIGN MAMMARY EPITHELIUM CONSISTENT WITH RAD THERAPY   BREAST EXCISIONAL BIOPSY Right    NEG 1980's   BREAST LUMPECTOMY Left 10/17/2015   DCIS and papillary carcinoma insitu, clear margins   CARDIAC SURGERY     has replacement valve   CATARACT EXTRACTION Right    CATARACT EXTRACTION W/PHACO Left 01/16/2021   Procedure: CATARACT EXTRACTION PHACO AND INTRAOCULAR LENS PLACEMENT (IOC) LEFT DIABETIC 8.46 00:59.8;  Surgeon: Birder Robson, MD;  Location: [facility];  Service: Ophthalmology;  Laterality: Left;   CHOLECYSTECTOMY     COLONOSCOPY WITH PROPOFOL N/A 03/11/2016   Procedure: COLONOSCOPY WITH PROPOFOL;  Surgeon: Manya Silvas, MD;  Location[facility];  Service: Endoscopy;  Laterality: N/A;   COLONOSCOPY WITH PROPOFOL N/A 02/18/2020   Procedure: COLONOSCOPY WITH PROPOFOL;  Surgeon: [name], MD;  Location: [facility] ENDOSCOPY;  Service: Endoscopy;  Laterality: N/A;   ESOPHAGOGASTRODUODENOSCOPY (EGD) WITH PROPOFOL N/A 03/11/2016   Procedure: ESOPHAGOGASTRODUODENOSCOPY (EGD) WITH PROPOFOL;  Surgeon: Manya Silvas, MD;  Location: [facility] ENDOSCOPY;  Service: Endoscopy;  Laterality: N/A;   ESOPHAGOGASTRODUODENOSCOPY (EGD) WITH PROPOFOL N/A 02/18/2020   Procedure: ESOPHAGOGASTRODUODENOSCOPY (EGD) WITH PROPOFOL;  Surgeon: [name], MD;  Location: [facility] ENDOSCOPY;  Service: Endoscopy;  Laterality: N/A;   ESOPHAGOGASTRODUODENOSCOPY (EGD) WITH PROPOFOL N/A 04/25/2020   Procedure: ESOPHAGOGASTRODUODENOSCOPY (EGD) WITH PROPOFOL;  Surgeon: [name], MD;  Location: [facility] ENDOSCOPY;  Service: Endoscopy;  Laterality: N/A;   ESOPHAGOGASTRODUODENOSCOPY (EGD) WITH PROPOFOL N/A 05/23/2020   Procedure: ESOPHAGOGASTRODUODENOSCOPY (EGD) WITH PROPOFOL;  Surgeon: [name], MD;  Location: [facility] ENDOSCOPY;  Service: Endoscopy;  Laterality: N/A;   FINGER ARTHROSCOPY WITH CARPOMETACARPEL ([place])  ARTHROPLASTY Right 09/03/2018   Procedure: CARPOMETACARPEL [facility]) ARTHROPLASTY RIGHT THUMB;  Surgeon: Hessie Knows, MD;  Location: [facility] ORS;  Service: Orthopedics;  Laterality: Right;   GANGLION CYST EXCISION Right 09/03/2018   Procedure: REMOVAL GANGLION OF WRIST;  Surgeon: Hessie Knows, MD;  Location: [facility] ORS;  Service: Orthopedics;  Laterality: Right;   HARDWARE REMOVAL Right 09/03/2018   Procedure: HARDWARE REMOVAL RIGHT THUMB;  Surgeon: Hessie Knows, MD;  Location: [facility] ORS;  Service: Orthopedics;  Laterality: Right;  staple removed   JOINT REPLACEMENT Left    TKR   left sinusplasty      MASTECTOMY, PARTIAL Left 10/17/2015   Procedure: MASTECTOMY PARTIAL REVISION;  Surgeon: Leonie Green, MD;  Location: [facility];  Service: General;  Laterality: Left;   PACEMAKER IMPLANT N/A 03/23/2021   Procedure: PACEMAKER IMPLANT;  Surgeon: Vickie Epley, MD;  Location: [place] [facility];  Service: Cardiovascular;  Laterality: N/A;   PARTIAL MASTECTOMY WITH NEEDLE LOCALIZATION Left 09/29/2015   Procedure: PARTIAL MASTECTOMY WITH NEEDLE LOCALIZATION;  Surgeon: Leonie Green, MD;  Location: [facility] ORS;  Service: General;  Laterality: Left;   TOTAL ABDOMINAL HYSTERECTOMY W/ BILATERAL SALPINGOOPHORECTOMY      FAMILY HISTORY :   Family History  Problem Relation Age of Onset   Breast cancer Paternal Grandmother    Colon cancer Father  Diabetes Sister    Diabetes Brother    Heart disease Brother    Prostate cancer Brother    Colon cancer Maternal Uncle    Prostate cancer Brother    Bladder Cancer Brother    Leukemia Mother        all   Ovarian cancer Neg Hx    Kidney cancer Neg Hx     SOCIAL HISTORY:   Social History   Tobacco Use   Smoking status: Never   Smokeless tobacco: Never  Vaping Use   Vaping Use: Never used  Substance Use Topics   Alcohol use: No    Alcohol/week: 0.0 standard drinks of alcohol   Drug use: No    ALLERGIES:  is allergic to codeine,  demeclocycline, demerol [meperidine], hydrocodone, oxycodone, pentazocine, tetracyclines & related, and fentanyl.  MEDICATIONS:  Current Outpatient Medications  Medication Sig Dispense Refill   acetaminophen (TYLENOL) 325 MG tablet Take 1-2 tablets (325-650 mg total) by mouth every 4 (four) hours as needed for mild pain.     Cholecalciferol (VITAMIN D) 50 MCG (2000 UT) CAPS Take 2,000 Units by mouth daily.     clobetasol ointment (TEMOVATE) 2.37 % Apply 1 application topically as needed (vaginal irritation).     clotrimazole (LOTRIMIN) 1 % cream Apply 1 Application topically 2 (two) times daily as needed.     cyanocobalamin (,VITAMIN B-12,) 1000 MCG/ML injection 1,000 mcg every 30 (thirty) days.     dicyclomine (BENTYL) 10 MG capsule Take 10 mg by mouth in the morning, at noon, and at bedtime.     docusate sodium (COLACE) 100 MG capsule Take 100 mg by mouth 3 (three) times daily as needed for moderate constipation.     esomeprazole (NEXIUM) 40 MG capsule Take 40 mg by mouth 2 (two) times daily before a meal.      estradiol (ESTRACE) 0.1 MG/GM vaginal cream Place 1 Applicatorful vaginally daily as needed (irritation).     fenofibrate 160 MG tablet Take 160 mg by mouth at bedtime.     gabapentin (NEURONTIN) 300 MG capsule Take 300 mg by mouth at bedtime.      hydrOXYzine (ATARAX/VISTARIL) 25 MG tablet Take 25 mg by mouth every 6 (six) hours as needed for anxiety, itching, nausea or vomiting.     Insulin Degludec (TRESIBA) 100 UNIT/ML SOLN Inject 160 Units into the skin at bedtime.     levocetirizine (XYZAL) 5 MG tablet Take 5 mg by mouth every evening.     levothyroxine (SYNTHROID, LEVOTHROID) 75 MCG tablet Take 75 mcg by mouth daily before breakfast.      magnesium oxide (MAG-OX) 400 MG tablet Take 400 mg by mouth 2 (two) times daily.      nadolol (CORGARD) 20 MG tablet Take 0.5 tablets (10 mg total) by mouth daily. 45 tablet 0   NOVOLOG FLEXPEN 100 UNIT/ML FlexPen Inject 48-68 Units into the  skin 3 (three) times daily with meals. Based on sliding scale     potassium chloride SA (K-DUR,KLOR-CON) 20 MEQ tablet Take 20 mEq by mouth 2 (two) times daily.      saccharomyces boulardii (FLORASTOR) 250 MG capsule Take 250 mg by mouth 2 (two) times daily.      simvastatin (ZOCOR) 20 MG tablet Take 20 mg by mouth at bedtime.     spironolactone (ALDACTONE) 25 MG tablet TAKE 1/2 TABLET BY MOUTH EVERY DAY 45 tablet 1   torsemide (DEMADEX) 20 MG tablet TAKE 1 TABLET BY MOUTH EVERY DAY 90 tablet  0   valACYclovir (VALTREX) 500 MG tablet Take 500 mg by mouth 2 (two) times daily as needed (fever blisters).  1   benzonatate (TESSALON) 200 MG capsule Take 200 mg by mouth 3 (three) times daily as needed for cough. (Patient not taking: Reported on 09/14/2021)     EPINEPHrine 0.3 mg/0.3 mL IJ SOAJ injection Inject 0.3 mg into the muscle as needed for anaphylaxis. (Patient not taking: Reported on 09/14/2021)     tirzepatide (MOUNJARO) 12.5 MG/0.5ML Pen Inject 12.5 mg into the skin once a week. (Patient not taking: Reported on 09/14/2021)     No current facility-administered medications for this visit.    PHYSICAL EXAMINATION: ECOG PERFORMANCE STATUS: 1 - Symptomatic but completely ambulatory  BP (!) 100/57   Pulse 66   Resp 16   Wt 147 lb 9.6 oz (67 kg)   LMP  (LMP Unknown)   SpO2 100%   BMI 26.15 kg/m   Filed Weights   09/14/21 1326  Weight: 147 lb 9.6 oz (67 kg)    Physical Exam Constitutional:      Comments: Alone.  Ambulating independently.  HENT:     Head: Normocephalic and atraumatic.     Mouth/Throat:     Pharynx: No oropharyngeal exudate.  Eyes:     Pupils: Pupils are equal, round, and reactive to light.  Cardiovascular:     Rate and Rhythm: Tachycardia present. Rhythm irregular.  Pulmonary:     Effort: Pulmonary effort is normal. No respiratory distress.     Breath sounds: Normal breath sounds. No wheezing.  Abdominal:     General: Bowel sounds are normal. There is no distension.      Palpations: Abdomen is soft. There is no mass.     Tenderness: There is no abdominal tenderness. There is no guarding or rebound.     Comments: Positive for splenomegaly.  Musculoskeletal:        General: No tenderness. Normal range of motion.     Cervical back: Normal range of motion and neck supple.  Skin:    General: Skin is warm.  Neurological:     Mental Status: She is alert and oriented to person, place, and time.  Psychiatric:        Mood and Affect: Affect normal.      LABORATORY DATA:  I have reviewed the data as listed    Component Value Date/Time   NA 136 09/14/2021 1311   NA 137 04/26/2021 1439   K 3.8 09/14/2021 1311   CL 100 09/14/2021 1311   CO2 28 09/14/2021 1311   GLUCOSE 129 (H) 09/14/2021 1311   BUN 15 09/14/2021 1311   BUN 19 04/26/2021 1439   CREATININE 0.71 09/14/2021 1311   CALCIUM 9.1 09/14/2021 1311   PROT 8.1 09/14/2021 1311   PROT 7.1 11/30/2020 0944   ALBUMIN 4.1 09/14/2021 1311   ALBUMIN 3.8 11/30/2020 0944   AST 29 09/14/2021 1311   ALT 20 09/14/2021 1311   ALKPHOS 52 09/14/2021 1311   BILITOT 1.1 09/14/2021 1311   BILITOT 0.8 11/30/2020 0944   GFRNONAA >60 09/14/2021 1311   GFRAA 80 02/25/2020 1105    No results found for: "SPEP", "UPEP"  Lab Results  Component Value Date   WBC 1.8 (L) 09/14/2021   NEUTROABS 0.9 (L) 09/14/2021   HGB 11.6 (L) 09/14/2021   HCT 33.8 (L) 09/14/2021   MCV 90.6 09/14/2021   PLT 67 (L) 09/14/2021      Chemistry  Component Value Date/Time   NA 136 09/14/2021 1311   NA 137 04/26/2021 1439   K 3.8 09/14/2021 1311   CL 100 09/14/2021 1311   CO2 28 09/14/2021 1311   BUN 15 09/14/2021 1311   BUN 19 04/26/2021 1439   CREATININE 0.71 09/14/2021 1311      Component Value Date/Time   CALCIUM 9.1 09/14/2021 1311   ALKPHOS 52 09/14/2021 1311   AST 29 09/14/2021 1311   ALT 20 09/14/2021 1311   BILITOT 1.1 09/14/2021 1311   BILITOT 0.8 11/30/2020 0944       RADIOGRAPHIC STUDIES: I have  personally reviewed the radiological images as listed and agreed with the findings in the report. No results found.   ASSESSMENT & PLAN:  Other pancytopenia ([place]) # Anemia-secondary GI bleed chronic/liver disease.  Hemoglobin-11.6.  Proceed with IV iron infusion.  # Rectal mass [Dr.Wohl] s/p sigmoidoscopy [09/14/2021]-NO Bx- done.  CT scan pending.  Discussed with Dr.Wohl.   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  ANC-900; Platelets 60- 70s. STABLE.   # Tachy-brady syndrome- Hx of A.fib [awaiting evaluation with Dr.End]-defer to cardiology regarding management.  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 Mammogram December 2021-within normal limits.  Stable.   # DISPOSITION:  # Venofer today # follow up in 1 month- MD; labs- cbc/cmp;  possible Venofer-Dr.B    Orders Placed This Encounter  Procedures   CBC with Differential/Platelet    Standing Status:   Future    Standing Expiration Date:   09/15/2022   Comprehensive metabolic panel    Standing Status:   Future    Standing Expiration Date:   09/15/2022   All questions were answered. The patient knows to call the clinic with any problems, questions or concerns.      Cammie Sickle, MD 09/16/2021 10:46 PM

## 2021-09-14 NOTE — Op Note (Signed)
[facility] Patient Name: Annette Foster Procedure Date: 09/14/2021 10:52 AM MRN: [id number] Account #: [id number] Date of Birth: [birth year] Admit Type: Outpatient Age: 81 Room: [facility] ENDO ROOM 4 Gender: Female Note Status: Finalized Instrument Name: Altamese Cabal Endoscope [id number] Procedure:             Flexible Sigmoidoscopy Indications:           Rectal mass Providers:             [name] MD, MD Medicines:             None Complications:         No immediate complications. Procedure:             Pre-Anesthesia Assessment:                        - Prior to the procedure, a History and Physical was                         performed, and patient medications and allergies were                         reviewed. The patient's tolerance of previous                         anesthesia was also reviewed. The risks and benefits                         of the procedure and the sedation options and risks                         were discussed with the patient. All questions were                         answered, and informed consent was obtained. Prior                         Anticoagulants: The patient has taken no previous                         anticoagulant or antiplatelet agents. ASA Grade                         Assessment: II - A patient with mild systemic disease.                         After reviewing the risks and benefits, the patient                         was deemed in satisfactory condition to undergo the                         procedure.                        After obtaining informed consent, the scope was passed                         under direct vision. The Endoscope was introduced  through the anus and advanced to the the sigmoid                         colon. The flexible sigmoidoscopy was accomplished                         without difficulty. The patient tolerated the                         procedure well. The  quality of the bowel preparation                         was good. Findings:      The digital rectal exam findings include palpable rectal mass. Pertinent       negatives include normal sphincter tone.      The colon (entire examined portion) appeared normal.      - The lesion felt is extrinsic. Impression:            - Palpable rectal mass found on digital rectal exam.                         (found to be extrinsic)                        - The entire examined colon is normal.                        - No specimens collected. Recommendation:        - Perform CT scan (computed tomography) of the pelvis                         with contrast. Procedure Code(s):     --- Professional ---                        [phone], Sigmoidoscopy, flexible; diagnostic, including                         collection of specimen(s) by brushing or washing, when                         performed (separate procedure) Diagnosis Code(s):     --- Professional ---                        K62.89, Other specified diseases of anus and rectum CPT copyright 2019 American Medical Association. All rights reserved. The codes documented in this report are preliminary and upon coder review may  be revised to meet current compliance requirements. [name] MD, MD 09/14/2021 11:08:14 AM This report has been signed electronically. Number of Addenda: 0 Note Initiated On: 09/14/2021 10:52 AM Total Procedure Duration: 0 hours 2 minutes 51 seconds  Estimated Blood Loss:  Estimated blood loss: none.      [facility]

## 2021-09-14 NOTE — OR Nursing (Signed)
No IV Needed per Dr Allen Norris.

## 2021-09-14 NOTE — Assessment & Plan Note (Addendum)
#   Anemia-secondary GI bleed chronic/liver disease.  Hemoglobin-11.6.  Proceed with IV iron infusion.  # Rectal mass [Dr.Wohl] s/p sigmoidoscopy [09/14/2021]-NO Bx- done.  CT scan pending.  Discussed with Dr.Wohl.   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  ANC-900; Platelets 60- 70s. STABLE.   # Tachy-brady syndrome- Hx of A.fib [awaiting evaluation with Dr.End]-defer to cardiology regarding management.  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 Mammogram December 2021-within normal limits.  Stable.   # DISPOSITION:  # Venofer today # follow up in 1 month- MD; labs- cbc/cmp;  possible Venofer-Dr.B

## 2021-09-14 NOTE — Interval H&P Note (Signed)
[name], MD [facility] [address], [place], [place] [address] Phone:[phone] Fax : [phone]  Primary Care Physician:  Albina Billet, MD Primary Gastroenterologist:  Dr. Allen Norris  Pre-Procedure History & Physical: HPI:  Annette Foster is a 81 y.o. female is here for an flexible sigmoidoscopy.   Past Medical History:  Diagnosis Date   Abdominal pain    Allergy    Cancer ([place]) 2017   breast cancer- Left   Cataract    CHF (congestive heart failure) (HCC)    Collagenous colitis    Diabetes mellitus without complication (HCC)    Diarrhea    Diverticulosis    Fatty liver disease, nonalcoholic    Fibrocystic breast    GERD (gastroesophageal reflux disease)    Heart murmur    Hyperlipidemia    Hypertension    Hypothyroidism    IBS (irritable bowel syndrome)    IDA (iron deficiency anemia)    Personal history of radiation therapy 2017   LEFT BREAST CA   PONV (postoperative nausea and vomiting)    Sleep apnea    C-Pap    Past Surgical History:  Procedure Laterality Date   ABDOMINAL HYSTERECTOMY     tah bso   ABDOMINAL SURGERY     APPENDECTOMY     AV NODE ABLATION N/A 05/17/2021   Procedure: AV NODE ABLATION;  Surgeon: Vickie Epley, MD;  Location: [place] [facility];  Service: Cardiovascular;  Laterality: N/A;   BREAST BIOPSY Bilateral 2016   negative   BREAST 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/2015   DCIS, papillary carcinoma in situ   BREAST BIOPSY Left 05/27/2016   BENIGN MAMMARY EPITHELIUM   BREAST BIOPSY Left 11/20/2017   affirm bx x clip BENIGN MAMMARY EPITHELIUM CONSISTENT WITH RAD THERAPY   BREAST EXCISIONAL BIOPSY Right    NEG 1980's   BREAST LUMPECTOMY Left 10/17/2015   DCIS and papillary carcinoma insitu, clear margins   CARDIAC SURGERY     has replacement valve   CATARACT EXTRACTION Right    CATARACT EXTRACTION W/PHACO Left 01/16/2021   Procedure: CATARACT EXTRACTION PHACO AND INTRAOCULAR LENS PLACEMENT (IOC) LEFT DIABETIC 8.46 00:59.8;   Surgeon: Birder Robson, MD;  Location: [place];  Service: Ophthalmology;  Laterality: Left;   CHOLECYSTECTOMY     COLONOSCOPY WITH PROPOFOL N/A 03/11/2016   Procedure: COLONOSCOPY WITH PROPOFOL;  Surgeon: Manya Silvas, MD;  Location: [facility] ENDOSCOPY;  Service: Endoscopy;  Laterality: N/A;   COLONOSCOPY WITH PROPOFOL N/A 02/18/2020   Procedure: COLONOSCOPY WITH PROPOFOL;  Surgeon: [name], MD;  Location: [facility] ENDOSCOPY;  Service: Endoscopy;  Laterality: N/A;   ESOPHAGOGASTRODUODENOSCOPY (EGD) WITH PROPOFOL N/A 03/11/2016   Procedure: ESOPHAGOGASTRODUODENOSCOPY (EGD) WITH PROPOFOL;  Surgeon: Manya Silvas, MD;  Location: [facility] ENDOSCOPY;  Service: Endoscopy;  Laterality: N/A;   ESOPHAGOGASTRODUODENOSCOPY (EGD) WITH PROPOFOL N/A 02/18/2020   Procedure: ESOPHAGOGASTRODUODENOSCOPY (EGD) WITH PROPOFOL;  Surgeon: [name], MD;  Location: [facility] ENDOSCOPY;  Service: Endoscopy;  Laterality: N/A;   ESOPHAGOGASTRODUODENOSCOPY (EGD) WITH PROPOFOL N/A 04/25/2020   Procedure: ESOPHAGOGASTRODUODENOSCOPY (EGD) WITH PROPOFOL;  Surgeon: [name], MD;  Location: [facility] ENDOSCOPY;  Service: Endoscopy;  Laterality: N/A;   ESOPHAGOGASTRODUODENOSCOPY (EGD) WITH PROPOFOL N/A 05/23/2020   Procedure: ESOPHAGOGASTRODUODENOSCOPY (EGD) WITH PROPOFOL;  Surgeon: [name], MD;  Location: [facility] ENDOSCOPY;  Service: Endoscopy;  Laterality: N/A;   FINGER ARTHROSCOPY WITH CARPOMETACARPEL ([place]) ARTHROPLASTY Right 09/03/2018   Procedure: CARPOMETACARPEL [facility]) ARTHROPLASTY RIGHT THUMB;  Surgeon: Hessie Knows, MD;  Location: [facility] ORS;  Service: Orthopedics;  Laterality: Right;   GANGLION CYST EXCISION Right 09/03/2018   Procedure: REMOVAL GANGLION OF WRIST;  Surgeon: Hessie Knows, MD;  Location: [facility] ORS;  Service: Orthopedics;  Laterality: Right;   HARDWARE REMOVAL Right 09/03/2018   Procedure: HARDWARE REMOVAL RIGHT THUMB;  Surgeon: Hessie Knows, MD;  Location: [facility] ORS;  Service: Orthopedics;   Laterality: Right;  staple removed   JOINT REPLACEMENT Left    TKR   left sinusplasty      MASTECTOMY, PARTIAL Left 10/17/2015   Procedure: MASTECTOMY PARTIAL REVISION;  Surgeon: Leonie Green, MD;  Location: [place] [facility];  Service: General;  Laterality: Left;   PACEMAKER IMPLANT N/A 03/23/2021   Procedure: PACEMAKER IMPLANT;  Surgeon: Vickie Epley, MD;  Location: [place] [facility];  Service: Cardiovascular;  Laterality: N/A;   PARTIAL MASTECTOMY WITH NEEDLE LOCALIZATION Left 09/29/2015   Procedure: PARTIAL MASTECTOMY WITH NEEDLE LOCALIZATION;  Surgeon: Leonie Green, MD;  Location: [facility] ORS;  Service: General;  Laterality: Left;   TOTAL ABDOMINAL HYSTERECTOMY W/ BILATERAL SALPINGOOPHORECTOMY      Prior to Admission medications   Medication Sig Start Date End Date Taking? Authorizing Provider  acetaminophen (TYLENOL) 325 MG tablet Take 1-2 tablets (325-650 mg total) by mouth every 4 (four) hours as needed for mild pain. 03/24/21  Yes Barrett, Evelene Croon, PA-C  benzonatate (TESSALON) 200 MG capsule Take 200 mg by mouth 3 (three) times daily as needed for cough.   Yes [provider]  Cholecalciferol (VITAMIN D) 50 MCG (2000 UT) CAPS Take 2,000 Units by mouth daily.   Yes [provider]  clobetasol ointment (TEMOVATE) 3.41 % Apply 1 application topically as needed (vaginal irritation).   Yes [provider]  clotrimazole (LOTRIMIN) 1 % cream Apply 1 Application topically 2 (two) times daily as needed.   Yes [provider]  cyanocobalamin (,VITAMIN B-12,) 1000 MCG/ML injection 1,000 mcg every 30 (thirty) days. 04/23/19  Yes [provider]  dicyclomine (BENTYL) 10 MG capsule Take 10 mg by mouth in the morning, at noon, and at bedtime. 05/16/19  Yes [provider]  docusate sodium (COLACE) 100 MG capsule Take 100 mg by mouth 3 (three) times daily as needed for moderate constipation. 01/10/20  Yes [provider]  EPINEPHrine  0.3 mg/0.3 mL IJ SOAJ injection Inject 0.3 mg into the muscle as needed for anaphylaxis. 05/18/19  Yes [provider]  esomeprazole (NEXIUM) 40 MG capsule Take 40 mg by mouth 2 (two) times daily before a meal.  02/08/16  Yes [provider]  estradiol (ESTRACE) 0.1 MG/GM vaginal cream Place 1 Applicatorful vaginally daily as needed (irritation).   Yes [provider]  fenofibrate 160 MG tablet Take 160 mg by mouth at bedtime.   Yes [provider]  gabapentin (NEURONTIN) 300 MG capsule Take 300 mg by mouth at bedtime.  09/07/16 10/25/36 Yes [provider]  hydrOXYzine (ATARAX/VISTARIL) 25 MG tablet Take 25 mg by mouth every 6 (six) hours as needed for anxiety, itching, nausea or vomiting.   Yes [provider]  Insulin Degludec (TRESIBA) 100 UNIT/ML SOLN Inject 160 Units into the skin at bedtime. 03/19/21  Yes Solum, Betsey Holiday, MD  levocetirizine (XYZAL) 5 MG tablet Take 5 mg by mouth every evening.   Yes [provider]  levothyroxine (SYNTHROID, LEVOTHROID) 75 MCG tablet Take 75 mcg by mouth daily before breakfast.    Yes [provider]  magnesium oxide (MAG-OX) 400 MG tablet Take 400 mg by mouth 2 (  two) times daily.    Yes [provider]  nadolol (CORGARD) 20 MG tablet Take 0.5 tablets (10 mg total) by mouth daily. 08/22/21  Yes End, Harrell Gave, MD  NOVOLOG FLEXPEN 100 UNIT/ML FlexPen Inject 48-68 Units into the skin 3 (three) times daily with meals. Based on sliding scale 08/14/19  Yes [provider]  potassium chloride SA (K-DUR,KLOR-CON) 20 MEQ tablet Take 20 mEq by mouth 2 (two) times daily.    Yes [provider]  saccharomyces boulardii (FLORASTOR) 250 MG capsule Take 250 mg by mouth 2 (two) times daily.    Yes [provider]  simvastatin (ZOCOR) 20 MG tablet Take 20 mg by mouth at bedtime.   Yes [provider]  spironolactone (ALDACTONE) 25 MG tablet TAKE 1/2 TABLET BY MOUTH EVERY  DAY 09/06/21  Yes End, Harrell Gave, MD  torsemide (DEMADEX) 20 MG tablet TAKE 1 TABLET BY MOUTH EVERY DAY 03/02/21  Yes End, Harrell Gave, MD  valACYclovir (VALTREX) 500 MG tablet Take 500 mg by mouth 2 (two) times daily as needed (fever blisters). 01/06/17  Yes [provider]  tirzepatide Darcel Bayley) 12.5 MG/0.5ML Pen Inject 12.5 mg into the skin once a week. Patient not taking: Reported on 09/14/2021    [provider]    Allergies as of 09/11/2021 - Review Complete 09/10/2021  Allergen Reaction Noted   Codeine Itching and Nausea And Vomiting 06/28/2014   Demeclocycline Rash 04/10/2012   Demerol [meperidine] Itching and Nausea And Vomiting 06/28/2014   Hydrocodone Itching and Nausea And Vomiting 08/24/2018   Oxycodone Itching and Nausea And Vomiting 09/26/2015   Pentazocine Itching and Nausea And Vomiting 04/10/2012   Tetracyclines & related Rash 06/28/2014   Fentanyl Itching and Nausea And Vomiting 06/28/2014    Family History  Problem Relation Age of Onset   Breast cancer Paternal Grandmother    Colon cancer Father    Diabetes Sister    Diabetes Brother    Heart disease Brother    Prostate cancer Brother    Colon cancer Maternal Uncle    Prostate cancer Brother    Bladder Cancer Brother    Leukemia Mother        all   Ovarian cancer Neg Hx    Kidney cancer Neg Hx     Social History   Socioeconomic History   Marital status: Married    Spouse name: Not on file   Number of children: Not on file   Years of education: Not on file   Highest education level: Not on file  Occupational History   Not on file  Tobacco Use   Smoking status: Never   Smokeless tobacco: Never  Vaping Use   Vaping Use: Never used  Substance and Sexual Activity   Alcohol use: No    Alcohol/week: 0.0 standard drinks of alcohol   Drug use: No   Sexual activity: Not Currently    Birth control/protection: Surgical  Other Topics Concern   Not on file  Social History Narrative    Not on file   Social Determinants of Health   Financial Resource Strain: Not on file  Food Insecurity: Not on file  Transportation Needs: Not on file  Physical Activity: Not on file  Stress: Not on file  Social Connections: Not on file  Intimate Partner Violence: Not on file    Review of Systems: See HPI, otherwise negative ROS  Physical Exam: BP 110/76   Pulse 69   Temp (!) 96.6 F (35.9 C)  Resp 20   Ht 5' 3"  (1.6 m)   Wt 67.1 kg   LMP  (LMP Unknown)   SpO2 100%   BMI 26.22 kg/m  General:   Alert,  pleasant and cooperative in NAD Head:  Normocephalic and atraumatic. Neck:  Supple; no masses or thyromegaly. Lungs:  Clear throughout to auscultation.    Heart:  Regular rate and rhythm. Abdomen:  Soft, nontender and nondistended. Normal bowel sounds, without guarding, and without rebound.   Neurologic:  Alert and  oriented x4;  grossly normal neurologically.  Impression/Plan: Annette Foster is here for an flexible sigmoidoscopy to be performed for rectal lesion of rectal exam  Risks, benefits, limitations, and alternatives regarding  flexible sigmoidoscopy have been reviewed with the patient.  Questions have been answered.  All parties agreeable.   [name], MD  09/14/2021, 10:52 AM

## 2021-09-15 LAB — AFP TUMOR MARKER: AFP, Serum, Tumor Marker: 3.9 ng/mL (ref 0.0–9.2)

## 2021-09-16 ENCOUNTER — Encounter: Payer: Self-pay | Admitting: Internal Medicine

## 2021-09-17 ENCOUNTER — Telehealth: Payer: Self-pay

## 2021-09-17 ENCOUNTER — Encounter: Payer: Self-pay | Admitting: Gastroenterology

## 2021-09-17 NOTE — Telephone Encounter (Signed)
Perform CT scan (computed tomography) of the pelvis with contrast  Rectal mass

## 2021-09-18 ENCOUNTER — Other Ambulatory Visit: Payer: Self-pay

## 2021-09-18 DIAGNOSIS — K6289 Other specified diseases of anus and rectum: Secondary | ICD-10-CM

## 2021-09-21 ENCOUNTER — Ambulatory Visit (INDEPENDENT_AMBULATORY_CARE_PROVIDER_SITE_OTHER): Payer: Medicare PPO

## 2021-09-21 DIAGNOSIS — I495 Sick sinus syndrome: Secondary | ICD-10-CM

## 2021-09-21 LAB — CUP PACEART REMOTE DEVICE CHECK
Date Time Interrogation Session: 20230811081759
Implantable Lead Implant Date: 20230210
Implantable Lead Implant Date: 20230210
Implantable Lead Location: 753859
Implantable Lead Location: 753860
Implantable Lead Model: 377169
Implantable Lead Model: 377171
Implantable Lead Serial Number: 7000400966
Implantable Lead Serial Number: 7000402075
Implantable Pulse Generator Implant Date: 20230210
Pulse Gen Model: 407145
Pulse Gen Serial Number: 70331369

## 2021-09-24 ENCOUNTER — Ambulatory Visit
Admission: RE | Admit: 2021-09-24 | Discharge: 2021-09-24 | Disposition: A | Discharge status: Home / Self Care | Payer: Medicare PPO | Source: Ambulatory Visit | Attending: Gastroenterology | Admitting: Gastroenterology

## 2021-09-24 DIAGNOSIS — K6289 Other specified diseases of anus and rectum: Secondary | ICD-10-CM | POA: Diagnosis present

## 2021-09-24 MED ORDER — IOHEXOL 300 MG/ML  SOLN
85.0000 mL | Freq: Once | INTRAMUSCULAR | Status: AC | PRN
  Administered 2021-09-24: 85 mL via INTRAVENOUS

## 2021-09-28 ENCOUNTER — Telehealth: Payer: Self-pay

## 2021-09-28 NOTE — Telephone Encounter (Signed)
Patient left a voicemail stating that she had a CT scan done on 09/24/2021 and she thinks she is going to have to have surgery. She would like to talk to someone about the referral and the results

## 2021-10-01 ENCOUNTER — Telehealth: Payer: Self-pay

## 2021-10-01 NOTE — Telephone Encounter (Signed)
Received referral for EUS. Records sent to [facility] for review prior to scheduling. Contacted Ms. Winthrop and she is available 8/24 if EUS approved. No anticoagulants noted on medication list.

## 2021-10-02 ENCOUNTER — Other Ambulatory Visit: Payer: Self-pay

## 2021-10-02 ENCOUNTER — Telehealth: Payer: Self-pay

## 2021-10-02 NOTE — Telephone Encounter (Signed)
Left message on voicemail letting pt know that she has been scheduled for 8/24 at [facility] and they will be  giving her a call to give more details and prep information

## 2021-10-04 ENCOUNTER — Encounter: Payer: Self-pay | Admitting: Cardiology

## 2021-10-04 ENCOUNTER — Encounter
Admission: RE | Disposition: A | Discharge status: Home / Self Care | Payer: Self-pay | Source: Home / Self Care | Attending: Gastroenterology

## 2021-10-04 ENCOUNTER — Ambulatory Visit: Payer: Medicare PPO | Admitting: Anesthesiology

## 2021-10-04 ENCOUNTER — Other Ambulatory Visit: Payer: Self-pay

## 2021-10-04 ENCOUNTER — Encounter: Payer: Self-pay | Admitting: *Deleted

## 2021-10-04 ENCOUNTER — Other Ambulatory Visit: Payer: Medicare PPO

## 2021-10-04 ENCOUNTER — Ambulatory Visit: Payer: Medicare PPO | Admitting: Urgent Care

## 2021-10-04 ENCOUNTER — Encounter
Admission: RE | Admit: 2021-10-04 | Discharge: 2021-10-04 | Disposition: A | Discharge status: Home / Self Care | Payer: Medicare PPO | Source: Ambulatory Visit | Attending: General Surgery | Admitting: General Surgery

## 2021-10-04 ENCOUNTER — Ambulatory Visit
Admission: RE | Admit: 2021-10-04 | Discharge: 2021-10-04 | Disposition: A | Discharge status: Home / Self Care | Payer: Medicare PPO | Attending: Gastroenterology | Admitting: Gastroenterology

## 2021-10-04 ENCOUNTER — Ambulatory Visit: Payer: Self-pay | Admitting: General Surgery

## 2021-10-04 DIAGNOSIS — E039 Hypothyroidism, unspecified: Secondary | ICD-10-CM | POA: Insufficient documentation

## 2021-10-04 DIAGNOSIS — Q438 Other specified congenital malformations of intestine: Secondary | ICD-10-CM | POA: Insufficient documentation

## 2021-10-04 DIAGNOSIS — E119 Type 2 diabetes mellitus without complications: Secondary | ICD-10-CM | POA: Insufficient documentation

## 2021-10-04 DIAGNOSIS — I509 Heart failure, unspecified: Secondary | ICD-10-CM | POA: Insufficient documentation

## 2021-10-04 DIAGNOSIS — K6289 Other specified diseases of anus and rectum: Secondary | ICD-10-CM | POA: Diagnosis present

## 2021-10-04 DIAGNOSIS — K219 Gastro-esophageal reflux disease without esophagitis: Secondary | ICD-10-CM | POA: Diagnosis not present

## 2021-10-04 DIAGNOSIS — Z950 Presence of cardiac pacemaker: Secondary | ICD-10-CM | POA: Insufficient documentation

## 2021-10-04 DIAGNOSIS — I110 Hypertensive heart disease with heart failure: Secondary | ICD-10-CM | POA: Diagnosis not present

## 2021-10-04 DIAGNOSIS — Z794 Long term (current) use of insulin: Secondary | ICD-10-CM

## 2021-10-04 HISTORY — DX: Presence of cardiac pacemaker: Z95.0

## 2021-10-04 HISTORY — DX: Cardiac arrhythmia, unspecified: I49.9

## 2021-10-04 HISTORY — PX: EUS: SHX5427

## 2021-10-04 LAB — GLUCOSE, CAPILLARY: Glucose-Capillary: 107 mg/dL — ABNORMAL HIGH (ref 70–99)

## 2021-10-04 SURGERY — ULTRASOUND, LOWER GI TRACT, ENDOSCOPIC
Anesthesia: General

## 2021-10-04 MED ORDER — SODIUM CHLORIDE 0.9 % IV SOLN
INTRAVENOUS | Status: DC
  Administered 2021-10-04: 1000 mL via INTRAVENOUS

## 2021-10-04 MED ORDER — PROPOFOL 10 MG/ML IV BOLUS
INTRAVENOUS | Status: DC | PRN
  Administered 2021-10-04: 60 mg via INTRAVENOUS

## 2021-10-04 MED ORDER — LIDOCAINE HCL (CARDIAC) PF 100 MG/5ML IV SOSY
PREFILLED_SYRINGE | INTRAVENOUS | Status: DC | PRN
  Administered 2021-10-04: 60 mg via INTRAVENOUS

## 2021-10-04 MED ORDER — EPHEDRINE SULFATE (PRESSORS) 50 MG/ML IJ SOLN
INTRAMUSCULAR | Status: DC | PRN
  Administered 2021-10-04: 5 mg via INTRAVENOUS

## 2021-10-04 MED ORDER — PROPOFOL 500 MG/50ML IV EMUL
INTRAVENOUS | Status: DC | PRN
  Administered 2021-10-04: 140 ug/kg/min via INTRAVENOUS

## 2021-10-04 NOTE — H&P (Signed)
PRE-PROCEDURE HISTORY AND PHYSICAL   Annette Foster presents for her scheduled Procedure(s): LOWER ENDOSCOPIC ULTRASOUND (EUS).  The indication for the procedure(s) is Palpable rectal mass.  There have been no significant recent changes in the patient's medical status.  Past Medical History:  Diagnosis Date   Abdominal pain    Allergy    Cancer ([place]) 2017   breast cancer- Left   Cataract    CHF (congestive heart failure) (HCC)    Collagenous colitis    Diabetes mellitus without complication (HCC)    Diarrhea    Diverticulosis    Dysrhythmia    Fatty liver disease, nonalcoholic    Fibrocystic breast    GERD (gastroesophageal reflux disease)    Heart murmur    Hyperlipidemia    Hypertension    Hypothyroidism    IBS (irritable bowel syndrome)    IDA (iron deficiency anemia)    Personal history of radiation therapy 2017   LEFT BREAST CA   PONV (postoperative nausea and vomiting)    Presence of permanent cardiac pacemaker    Sleep apnea    C-Pap    Past Surgical History:  Procedure Laterality Date   ABDOMINAL HYSTERECTOMY     tah bso   ABDOMINAL SURGERY     APPENDECTOMY     AV NODE ABLATION N/A 05/17/2021   Procedure: AV NODE ABLATION;  Surgeon: Vickie Epley, MD;  Location: [place] [facility];  Service: Cardiovascular;  Laterality: N/A;   BREAST BIOPSY Bilateral 2016   negative   BREAST 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/2015   DCIS, papillary carcinoma in situ   BREAST BIOPSY Left 05/27/2016   BENIGN MAMMARY EPITHELIUM   BREAST BIOPSY Left 11/20/2017   affirm bx x clip BENIGN MAMMARY EPITHELIUM CONSISTENT WITH RAD THERAPY   BREAST EXCISIONAL BIOPSY Right    NEG 1980's   BREAST LUMPECTOMY Left 10/17/2015   DCIS and papillary carcinoma insitu, clear margins   CARDIAC SURGERY     has replacement valve   CATARACT EXTRACTION Right    CATARACT EXTRACTION W/PHACO Left 01/16/2021   Procedure: CATARACT EXTRACTION PHACO AND INTRAOCULAR LENS PLACEMENT (IOC) LEFT DIABETIC 8.46 00:59.8;   Surgeon: Birder Robson, MD;  Location: [place];  Service: Ophthalmology;  Laterality: Left;   CHOLECYSTECTOMY     COLONOSCOPY WITH PROPOFOL N/A 03/11/2016   Procedure: COLONOSCOPY WITH PROPOFOL;  Surgeon: Manya Silvas, MD;  Location: [facility] ENDOSCOPY;  Service: Endoscopy;  Laterality: N/A;   COLONOSCOPY WITH PROPOFOL N/A 02/18/2020   Procedure: COLONOSCOPY WITH PROPOFOL;  Surgeon: [name], MD;  Location: [facility] ENDOSCOPY;  Service: Endoscopy;  Laterality: N/A;   ESOPHAGOGASTRODUODENOSCOPY (EGD) WITH PROPOFOL N/A 03/11/2016   Procedure: ESOPHAGOGASTRODUODENOSCOPY (EGD) WITH PROPOFOL;  Surgeon: Manya Silvas, MD;  Location: [facility] ENDOSCOPY;  Service: Endoscopy;  Laterality: N/A;   ESOPHAGOGASTRODUODENOSCOPY (EGD) WITH PROPOFOL N/A 02/18/2020   Procedure: ESOPHAGOGASTRODUODENOSCOPY (EGD) WITH PROPOFOL;  Surgeon: [name], MD;  Location: [facility] ENDOSCOPY;  Service: Endoscopy;  Laterality: N/A;   ESOPHAGOGASTRODUODENOSCOPY (EGD) WITH PROPOFOL N/A 04/25/2020   Procedure: ESOPHAGOGASTRODUODENOSCOPY (EGD) WITH PROPOFOL;  Surgeon: [name], MD;  Location: [facility] ENDOSCOPY;  Service: Endoscopy;  Laterality: N/A;   ESOPHAGOGASTRODUODENOSCOPY (EGD) WITH PROPOFOL N/A 05/23/2020   Procedure: ESOPHAGOGASTRODUODENOSCOPY (EGD) WITH PROPOFOL;  Surgeon: [name], MD;  Location: [facility] ENDOSCOPY;  Service: Endoscopy;  Laterality: N/A;   FINGER ARTHROSCOPY WITH CARPOMETACARPEL ([place]) ARTHROPLASTY Right 09/03/2018   Procedure: CARPOMETACARPEL [facility]) ARTHROPLASTY RIGHT THUMB;  Surgeon: Hessie Knows, MD;  Location: [facility] ORS;  Service: Orthopedics;  Laterality: Right;   FLEXIBLE SIGMOIDOSCOPY N/A 09/14/2021   Procedure: FLEXIBLE SIGMOIDOSCOPY;  Surgeon: [name], MD;  Location: [facility] ENDOSCOPY;  Service: Endoscopy;  Laterality: N/A;  No anesthesia   GANGLION CYST EXCISION Right 09/03/2018   Procedure: REMOVAL GANGLION OF WRIST;  Surgeon: Hessie Knows, MD;  Location: [facility] ORS;  Service:  Orthopedics;  Laterality: Right;   HARDWARE REMOVAL Right 09/03/2018   Procedure: HARDWARE REMOVAL RIGHT THUMB;  Surgeon: Hessie Knows, MD;  Location: [facility] ORS;  Service: Orthopedics;  Laterality: Right;  staple removed   JOINT REPLACEMENT Left    TKR   left sinusplasty      MASTECTOMY, PARTIAL Left 10/17/2015   Procedure: MASTECTOMY PARTIAL REVISION;  Surgeon: Leonie Green, MD;  Location: [place] [facility];  Service: General;  Laterality: Left;   PACEMAKER IMPLANT N/A 03/23/2021   Procedure: PACEMAKER IMPLANT;  Surgeon: Vickie Epley, MD;  Location: [place] [facility] LAB;  Service: Cardiovascular;  Laterality: N/A;   PARTIAL MASTECTOMY WITH NEEDLE LOCALIZATION Left 09/29/2015   Procedure: PARTIAL MASTECTOMY WITH NEEDLE LOCALIZATION;  Surgeon: Leonie Green, MD;  Location: [facility] ORS;  Service: General;  Laterality: Left;   TOTAL ABDOMINAL HYSTERECTOMY W/ BILATERAL SALPINGOOPHORECTOMY      Allergies Allergies  Allergen Reactions   Codeine Itching and Nausea And Vomiting   Demeclocycline Rash   Demerol [Meperidine] Itching and Nausea And Vomiting   Hydrocodone Itching and Nausea And Vomiting   Oxycodone Itching and Nausea And Vomiting   Pentazocine Itching and Nausea And Vomiting   Tetracyclines & Related Rash   Fentanyl Itching and Nausea And Vomiting    D/W surgical nurse on 7/23 and pt reportedly tolerated dose during surgery.  Dose was given by MD.  Pt reported that she only gets itching and did not object to receiving med    Medications EPINEPHrine, Insulin Degludec, NONFORMULARY OR COMPOUNDED ITEM, Vitamin D, acetaminophen, benzonatate, ciprofloxacin, clobetasol ointment, clotrimazole, cyanocobalamin, dicyclomine, docusate sodium, esomeprazole, estradiol, fenofibrate, gabapentin, hydrOXYzine, insulin aspart, levocetirizine, levothyroxine, magnesium oxide, metroNIDAZOLE, nadolol, potassium chloride SA, saccharomyces boulardii, simvastatin, spironolactone, tirzepatide, torsemide,  and valACYclovir  Physical Examination  Body mass index is 25.2 kg/m. BP (!) 117/57   Pulse 68   Temp (!) 96.5 F (35.8 C) (Temporal)   Resp 18   Ht 5' 3"  (1.6 m)   Wt 64.5 kg   LMP  (LMP Unknown)   SpO2 100%   BMI 25.20 kg/m  General:   Alert,  pleasant and cooperative in NAD Head:  Normocephalic and atraumatic. Lungs:  No audible wheezes Heart:  Regular  Abdomen:  Soft, mild tenderness diffusely no rebound or guarding.   Neurologic:  Alert and  oriented x4;  grossly normal neurologically.  ASSESSMENT AND PLAN  Ms. Icard has been evaluated and deemed appropriate to undergo the planned Procedure(s): LOWER ENDOSCOPIC ULTRASOUND (EUS).

## 2021-10-04 NOTE — Transfer of Care (Signed)
Immediate Anesthesia Transfer of Care Note  Patient: Annette Foster  Procedure(s) Performed: LOWER ENDOSCOPIC ULTRASOUND (EUS)  Patient Location: PACU  Anesthesia Type:General  Level of Consciousness: awake, alert  and oriented  Airway & Oxygen Therapy: Patient Spontanous Breathing  Post-op Assessment: Report given to RN and Post -op Vital signs reviewed and stable  Post vital signs: Reviewed and stable  Last Vitals:  Vitals Value Taken Time  BP 141/51 10/04/21 1424  Temp 36 C 10/04/21 1423  Pulse 77 10/04/21 1425  Resp 21 10/04/21 1426  SpO2 100 % 10/04/21 1425  Vitals shown include unvalidated device data.  Last Pain:  Vitals:   10/04/21 1423  TempSrc: Temporal  PainSc: Asleep         Complications: No notable events documented.

## 2021-10-04 NOTE — Patient Instructions (Addendum)
Your procedure is scheduled on: 10/05/21 - Friday Report to the Registration Desk on the 1st floor of the [facility]. To find out your arrival time, please call [phone] between 1PM - 3PM on: 10/04/21 - Thursday If your arrival time is 6:00 am, do not arrive prior to that time as the [facility] entrance doors do not open until 6:00 am.  REMEMBER: Instructions that are not followed completely may result in serious medical risk, up to and including death; or upon the discretion of your surgeon and anesthesiologist your surgery may need to be rescheduled.  Do not eat food or drink any fluids after midnight the night before surgery.  No gum chewing, lozengers or hard candies.  TAKE THESE MEDICATIONS THE MORNING OF SURGERY WITH A SIP OF WATER:  - dicyclomine (BENTYL)  - esomeprazole ([place]) , (take one the night before and one on the morning of surgery - helps to prevent nausea after surgery.) - levothyroxine (SYNTHROID) - nadolol (CORGARD)    Inject only half of your prescribed Insulin Degludec (TRESIBA) 100 UNIT/ML SOLN on the night before your surgery.  Inject No NOVOLOG FLEXPEN 100 UNIT/ML FlexPen on the morning of surgery.  One week prior to surgery: Stop Anti-inflammatories (NSAIDS) such as Advil, Aleve, Ibuprofen, Motrin, Naproxen, Naprosyn and Aspirin based products such as Excedrin, Goodys Powder, BC Powder.  Stop ANY OVER THE COUNTER supplements until after surgery.  You may however, continue to take Tylenol if needed for pain up until the day of surgery.  No Alcohol for 24 hours before or after surgery.  No Smoking including e-cigarettes for 24 hours prior to surgery.  No chewable tobacco products for at least 6 hours prior to surgery.  No nicotine patches on the day of surgery.  Do not use any "recreational" drugs for at least a week prior to your surgery.  Please be advised that the combination of cocaine and anesthesia may have negative outcomes, up to and  including death. If you test positive for cocaine, your surgery will be cancelled.  On the morning of surgery brush your teeth with toothpaste and water, you may rinse your mouth with mouthwash if you wish. Do not swallow any toothpaste or mouthwash.  Do not wear jewelry, make-up, hairpins, clips or nail polish.  Do not wear lotions, powders, or perfumes.   Do not shave body from the neck down 48 hours prior to surgery just in case you cut yourself which could leave a site for infection.  Also, freshly shaved skin may become irritated if using the CHG soap.  Contact lenses, hearing aids and dentures may not be worn into surgery.  Do not bring valuables to the hospital. [facility] is not responsible for any missing/lost belongings or valuables.   Notify your doctor if there is any change in your medical condition (cold, fever, infection).  Wear comfortable clothing (specific to your surgery type) to the hospital.  After surgery, you can help prevent lung complications by doing breathing exercises.  Take deep breaths and cough every 1-2 hours. Your doctor may order a device called an Incentive Spirometer to help you take deep breaths. When coughing or sneezing, hold a pillow firmly against your incision with both hands. This is called "splinting." Doing this helps protect your incision. It also decreases belly discomfort.  If you are being admitted to the hospital overnight, leave your suitcase in the car. After surgery it may be brought to your room.  If you are being discharged  the day of surgery, you will not be allowed to drive home. You will need a responsible adult (18 years or older) to drive you home and stay with you that night.   If you are taking public transportation, you will need to have a responsible adult (18 years or older) with you. Please confirm with your physician that it is acceptable to use public transportation.   Please call the [place] Dept.  at [phone] if you have any questions about these instructions.  Surgery Visitation Policy:  Patients undergoing a surgery or procedure may have two family members or support persons with them as long as the person is not COVID-19 positive or experiencing its symptoms.   Inpatient Visitation:    Visiting hours are 7 a.m. to 8 p.m. Up to four visitors are allowed at one time in a patient room, including children. The visitors may rotate out with other people during the day. One designated support person (adult) may remain overnight.

## 2021-10-04 NOTE — Anesthesia Preprocedure Evaluation (Addendum)
Anesthesia Evaluation  Patient identified by MRN, date of birth, ID band Patient awake    Reviewed: Allergy & Precautions, H&P , NPO status , Patient's Chart, lab work & pertinent test results, reviewed documented beta blocker date and time   History of Anesthesia Complications (+) PONV and history of anesthetic complications  Airway Mallampati: II   Neck ROM: full    Dental  (+) Poor Dentition   Pulmonary neg pulmonary ROS, sleep apnea ,    Pulmonary exam normal        Cardiovascular hypertension, On Medications +CHF  negative cardio ROS Normal cardiovascular exam+ dysrhythmias + pacemaker + Valvular Problems/Murmurs  Rhythm:regular Rate:Normal     Neuro/Psych negative neurological ROS  negative psych ROS   GI/Hepatic GERD  ,(+) Hepatitis -  Endo/Other  diabetesHypothyroidism   Renal/GU Renal disease  negative genitourinary   Musculoskeletal   Abdominal   Peds  Hematology negative hematology ROS (+)   Anesthesia Other Findings Past Medical History: No date: Abdominal pain No date: Allergy 2017: Cancer ([place])     Comment:  breast cancer- Left No date: Cataract No date: CHF (congestive heart failure) (HCC) No date: Collagenous colitis No date: Diabetes mellitus without complication (HCC) No date: Diarrhea No date: Diverticulosis No date: Dysrhythmia No date: Fatty liver disease, nonalcoholic No date: Fibrocystic breast No date: GERD (gastroesophageal reflux disease) No date: Heart murmur No date: Hyperlipidemia No date: Hypertension No date: Hypothyroidism No date: IBS (irritable bowel syndrome) No date: IDA (iron deficiency anemia) 2017: Personal history of radiation therapy     Comment:  LEFT BREAST CA No date: PONV (postoperative nausea and vomiting) No date: Presence of permanent cardiac pacemaker No date: Sleep apnea     Comment:  C-Pap Past Surgical History: No date: ABDOMINAL  HYSTERECTOMY     Comment:  [place] bso No date: ABDOMINAL SURGERY No date: APPENDECTOMY 05/17/2021: AV NODE ABLATION; N/A     Comment:  Procedure: AV NODE ABLATION;  Surgeon: Vickie Epley, MD;  Location: [place] [place] [facility];  Service:               Cardiovascular;  Laterality: N/A; 2016: BREAST BIOPSY; Bilateral     Comment:  negative 09/11/2015: BREAST BIOPSY; Left     Comment:  DCIS, papillary carcinoma in situ 05/27/2016: BREAST BIOPSY; Left     Comment:  BENIGN MAMMARY EPITHELIUM 11/20/2017: BREAST BIOPSY; Left     Comment:  affirm bx x clip BENIGN MAMMARY EPITHELIUM CONSISTENT               WITH RAD THERAPY No date: BREAST EXCISIONAL BIOPSY; Right     Comment:  NEG 1980's 10/17/2015: BREAST LUMPECTOMY; Left     Comment:  DCIS and papillary carcinoma insitu, clear margins No date: CARDIAC SURGERY     Comment:  has replacement valve No date: CATARACT EXTRACTION; Right 01/16/2021: CATARACT EXTRACTION W/PHACO; Left     Comment:  Procedure: CATARACT EXTRACTION PHACO AND INTRAOCULAR               LENS PLACEMENT (IOC) LEFT DIABETIC 8.46 00:59.8;                Surgeon: Birder Robson, MD;  Location: [facility];  Service: Ophthalmology;  Laterality: Left; No date: CHOLECYSTECTOMY 03/11/2016: COLONOSCOPY WITH PROPOFOL; N/A     Comment:  Procedure: COLONOSCOPY WITH PROPOFOL;  Surgeon: Manya Silvas, MD;  Location: [facility] ENDOSCOPY;  Service:               Endoscopy;  Laterality: N/A; 02/18/2020: COLONOSCOPY WITH PROPOFOL; N/A     Comment:  Procedure: COLONOSCOPY WITH PROPOFOL;  Surgeon: [name], MD;  Location: [facility] ENDOSCOPY;  Service:               Endoscopy;  Laterality: N/A; 03/11/2016: ESOPHAGOGASTRODUODENOSCOPY (EGD) WITH PROPOFOL; N/A     Comment:  Procedure: ESOPHAGOGASTRODUODENOSCOPY (EGD) WITH               PROPOFOL;  Surgeon: Manya Silvas, MD;  Location: [facility]              ENDOSCOPY;  Service:  Endoscopy;  Laterality: N/A; 02/18/2020: ESOPHAGOGASTRODUODENOSCOPY (EGD) WITH PROPOFOL; N/A     Comment:  Procedure: ESOPHAGOGASTRODUODENOSCOPY (EGD) WITH               PROPOFOL;  Surgeon: [name], MD;  Location: [facility]               ENDOSCOPY;  Service: Endoscopy;  Laterality: N/A; 04/25/2020: ESOPHAGOGASTRODUODENOSCOPY (EGD) WITH PROPOFOL; N/A     Comment:  Procedure: ESOPHAGOGASTRODUODENOSCOPY (EGD) WITH               PROPOFOL;  Surgeon: [name], MD;  Location: [facility]               ENDOSCOPY;  Service: Endoscopy;  Laterality: N/A; 05/23/2020: ESOPHAGOGASTRODUODENOSCOPY (EGD) WITH PROPOFOL; N/A     Comment:  Procedure: ESOPHAGOGASTRODUODENOSCOPY (EGD) WITH               PROPOFOL;  Surgeon: [name], MD;  Location: [facility]               ENDOSCOPY;  Service: Endoscopy;  Laterality: N/A; 09/03/2018: FINGER ARTHROSCOPY WITH CARPOMETACARPEL ([place])  ARTHROPLASTY; Right     Comment:  Procedure: CARPOMETACARPEL [facility]) ARTHROPLASTY RIGHT               THUMB;  Surgeon: Hessie Knows, MD;  Location: [facility] ORS;               Service: Orthopedics;  Laterality: Right; 09/14/2021: FLEXIBLE SIGMOIDOSCOPY; N/A     Comment:  Procedure: FLEXIBLE SIGMOIDOSCOPY;  Surgeon: [name], MD;  Location: [facility] ENDOSCOPY;  Service:               Endoscopy;  Laterality: N/A;  No anesthesia 09/03/2018: GANGLION CYST EXCISION; Right     Comment:  Procedure: REMOVAL GANGLION OF WRIST;  Surgeon: Hessie Knows, MD;  Location: [facility] ORS;  Service: Orthopedics;               Laterality: Right; 09/03/2018: HARDWARE REMOVAL; Right     Comment:  Procedure: HARDWARE REMOVAL RIGHT THUMB;  Surgeon: Hessie Knows, MD;  Location: [facility] ORS;  Service: Orthopedics;               Laterality: Right;  staple removed No date: JOINT REPLACEMENT; Left     Comment:  TKR No date: left  sinusplasty  10/17/2015: MASTECTOMY, PARTIAL; Left     Comment:  Procedure: MASTECTOMY PARTIAL REVISION;   Surgeon: Leonie Green, MD;  Location: [place] [facility];  Service: General;              Laterality: Left; 03/23/2021: PACEMAKER IMPLANT; N/A     Comment:  Procedure: PACEMAKER IMPLANT;  Surgeon: Vickie Epley, MD;  Location: [place] [facility];  Service:               Cardiovascular;  Laterality: N/A; 09/29/2015: PARTIAL MASTECTOMY WITH NEEDLE LOCALIZATION; Left     Comment:  Procedure: PARTIAL MASTECTOMY WITH NEEDLE LOCALIZATION;               Surgeon: Leonie Green, MD;  Location: [facility] ORS;                Service: General;  Laterality: Left; No date: TOTAL ABDOMINAL HYSTERECTOMY W/ BILATERAL SALPINGOOPHORECTOMY   Reproductive/Obstetrics negative OB ROS                             Anesthesia Physical Anesthesia Plan  ASA: 4  Anesthesia Plan: General   Post-op Pain Management:    Induction:   PONV Risk Score and Plan:   Airway Management Planned:   Additional Equipment:   Intra-op Plan:   Post-operative Plan:   Informed Consent: I have reviewed the patients History and Physical, chart, labs and discussed the procedure including the risks, benefits and alternatives for the proposed anesthesia with the patient or authorized representative who has indicated his/her understanding and acceptance.     Dental Advisory Given  Plan Discussed with: CRNA  Anesthesia Plan Comments:        Anesthesia Quick Evaluation

## 2021-10-04 NOTE — Progress Notes (Signed)
[place] DEVICE PROGRAMMING  Patient Information: Name:  Annette Foster  DOB:  1940/05/07  MRN:  [id number]     Planned Procedure: RECTAL EXAM UNDER ANESTHESIA    Surgeon:  Dr. Herbert Pun, MD  Requesting device clearance: Honor Loh, FNP-C  Date of Procedure:  10/05/2021  Cautery may be used.   Device Information:  Clinic EP Physician:  Vickie Epley MD   Device Type:  Pacemaker Manufacturer and Phone #:  [employer]: [phone] Pacemaker Dependent?:  Yes.   Date of Last Device Check:  09/21/21 Remote Normal Device Function?:  Yes.    Electrophysiologist's Recommendations:  Have magnet available. Provide continuous ECG monitoring when magnet is used or reprogramming is to be performed.  Procedure may interfere with device function.  Magnet should be placed over device during procedure.  Per Device Clinic Standing Orders, Diamond Nickel, RN  3:35 PM 10/04/2021

## 2021-10-04 NOTE — Op Note (Signed)
[facility] Patient Name: Annette Foster Procedure Date: 10/02/2021 1:02 PM MRN: [id number] Account #: [id number] Date of Birth: June 08, 1940 Admit Type: Outpatient Age: 81 Room: [facility] ENDO ROOM 3 Gender: Female Note Status: Finalized Instrument Name: Radial EUS [id number] Procedure:             Lower EUS Indications:           Rectal deformity found on endoscopy; subepithelial                         tumor versus extrinsic compression, Suspected fluid                         collection seen on abdominal/pelvic CT scan Patient Profile:       Refer to note in patient chart for documentation of                         history and physical. Providers:             Lenetta Quaker. Cephas Darby, MD Referring MD:          Leona Carry. Hall Busing, MD (Referring MD), Herbert Pun                         (Referring MD), [name] MD, MD (Referring MD) Medicines:             Monitored Anesthesia Care Complications:         No immediate complications. Procedure:             Pre-Anesthesia Assessment:                        - Monitored anesthesia care was determined to be                         medically necessary for this procedure based on                         complex procedure (ERCP, EUS).                        After obtaining informed consent, the endoscope was                         passed under direct vision. Throughout the procedure,                         the patient's blood pressure, pulse, and oxygen                         saturations were monitored continuously. The Endoscope                         was introduced through the anus and advanced to the                         the sigmoid colon for ultrasound. Findings:      The digital rectal exam revealed a 1 cm (diameter) flocculent and mobile       rectal mass.      ENDOSCOPIC FINDING: :  One 15 mm submucosal nodule was found in the rectum over the left       anterior wall.      ENDOSONOGRAPHIC  FINDING: :      The anal canal was normal.      An oval intramural (subepithelial) lesion was found in the rectum. The       lesion was encountered at 2 cm (from the anal verge). The lesion was       non-circumferential and located predominantly at the left-anterior       rectal wall. The lesion was heterogenous. Sonographically, the origin       appeared to be within the intramural wall, but the wall layer could not       be determined. The lesion measured up to 14 mm by 19 mm. The       endosonographic borders were well-defined.      No malignant-appearing lymph nodes were seen in the perirectal region       and in the left iliac region during endosonographic examination of the       rectum and sigmoid colon. Impression:            Flexibile Sigmoidoscopy Impression:                        - Submucosal nodule in the rectum.                        Rectal EUS Impression:                        - Endosonographic images of the anal canal were                         unremarkable.                        - An intramural (subepithelial) lesion was visualized                         endosonographically in the rectum. The origin of the                         lesion appeared to be within the intramural wall, but                         the wall layer could not be determined. Tissue has not                         been obtained. However, the endosonographic appearance                         is consistent with a benign inflammatory collection.                         Given plans for drainage and concern for infection                         this was not sampled.                        - No malignant-appearing lymph nodes were seen in  the                         perirectal region and in the left iliac region during                         endosonographic examination of the rectum and sigmoid                         colon.                        - No specimens collected. Recommendation:        -  Discharge patient to home.                        - No additional extrinsic or intramural mass lesion                         outside of the low rectal hetergenous collection which                         correlates with the CT findings.                        - Return to referring physician as previously                         scheduled.                        - The findings and recommendations were discussed with                         the patient and their family. Attending Participation:      I personally performed the entire procedure. Dr. Lenetta Quaker. Cephas Darby, MD Lenetta Quaker. Ellakate Gonsalves, MD 10/04/2021 2:34:51 PM This report has been signed electronically. Number of Addenda: 0 Note Initiated On: 10/02/2021 1:02 PM Total Procedure Duration: 0 hours 21 minutes 54 seconds  Estimated Blood Loss:  Estimated blood loss: none.      [facility]

## 2021-10-04 NOTE — Progress Notes (Signed)
Tumor Board Documentation  Annette Foster was presented by Dr Cephas Darby at our Tumor Board on 10/04/2021, which included representatives from medical oncology, surgical, pharmacy, pulmonology, radiology, genetics, pathology, research, navigation, radiation oncology, palliative care.  Annette Foster currently presents as a current patient, for Annette Foster with history of the following treatments: active survellience.  Additionally, we reviewed previous medical and familial history, history of present illness, and recent lab results along with all available histopathologic and imaging studies. The tumor board considered available treatment options and made the following recommendations: Biopsy, Additional screening, Surgery (MRI, Drain abcess) EUS possible biopsy today  The following procedures/referrals were also placed: No orders of the defined types were placed in this encounter.   Clinical Trial Status: not discussed   Staging used: To be determined AJCC Staging:       Group: Rectal Mass   National site-specific guidelines   were discussed with respect to the case.  Tumor board is a meeting of clinicians from various specialty areas who evaluate and discuss patients for whom a multidisciplinary approach is being considered. Final determinations in the plan of care are those of the provider(s). The responsibility for follow up of recommendations given during tumor board is that of the provider.   Today's extended care, comprehensive team conference, Annette Foster was not present for the discussion and was not examined.   Multidisciplinary Tumor Board is a multidisciplinary case peer review process.  Decisions discussed in the Multidisciplinary Tumor Board reflect the opinions of the specialists present at the conference without having examined the patient.  Ultimately, treatment and diagnostic decisions rest with the primary provider(s) and the patient.

## 2021-10-05 ENCOUNTER — Other Ambulatory Visit: Payer: Self-pay

## 2021-10-05 ENCOUNTER — Ambulatory Visit: Payer: Medicare PPO | Admitting: Urgent Care

## 2021-10-05 ENCOUNTER — Encounter
Admission: RE | Disposition: A | Discharge status: Home / Self Care | Payer: Self-pay | Source: Home / Self Care | Attending: General Surgery

## 2021-10-05 ENCOUNTER — Ambulatory Visit
Admission: RE | Admit: 2021-10-05 | Discharge: 2021-10-05 | Disposition: A | Discharge status: Home / Self Care | Payer: Medicare PPO | Attending: General Surgery | Admitting: General Surgery

## 2021-10-05 ENCOUNTER — Encounter: Payer: Self-pay | Admitting: General Surgery

## 2021-10-05 DIAGNOSIS — E119 Type 2 diabetes mellitus without complications: Secondary | ICD-10-CM | POA: Insufficient documentation

## 2021-10-05 DIAGNOSIS — D649 Anemia, unspecified: Secondary | ICD-10-CM | POA: Insufficient documentation

## 2021-10-05 DIAGNOSIS — K611 Rectal abscess: Secondary | ICD-10-CM | POA: Diagnosis present

## 2021-10-05 DIAGNOSIS — Z794 Long term (current) use of insulin: Secondary | ICD-10-CM

## 2021-10-05 DIAGNOSIS — D759 Disease of blood and blood-forming organs, unspecified: Secondary | ICD-10-CM | POA: Insufficient documentation

## 2021-10-05 HISTORY — PX: RECTAL EXAM UNDER ANESTHESIA: SHX6399

## 2021-10-05 LAB — GLUCOSE, CAPILLARY
Glucose-Capillary: 175 mg/dL — ABNORMAL HIGH (ref 70–99)
Glucose-Capillary: 175 mg/dL — ABNORMAL HIGH (ref 70–99)

## 2021-10-05 SURGERY — EXAM UNDER ANESTHESIA, RECTUM
Anesthesia: General | Site: Rectum

## 2021-10-05 MED ORDER — EPHEDRINE SULFATE (PRESSORS) 50 MG/ML IJ SOLN
INTRAMUSCULAR | Status: DC | PRN
  Administered 2021-10-05 (×3): 5 mg via INTRAVENOUS

## 2021-10-05 MED ORDER — ACETAMINOPHEN 10 MG/ML IV SOLN
INTRAVENOUS | Status: DC | PRN
  Administered 2021-10-05: 1000 mg via INTRAVENOUS

## 2021-10-05 MED ORDER — BUPIVACAINE-EPINEPHRINE (PF) 0.5% -1:200000 IJ SOLN
INTRAMUSCULAR | Status: AC
  Filled 2021-10-05: qty 30

## 2021-10-05 MED ORDER — 0.9 % SODIUM CHLORIDE (POUR BTL) OPTIME
TOPICAL | Status: DC | PRN
  Administered 2021-10-05: 500 mL

## 2021-10-05 MED ORDER — EPHEDRINE 5 MG/ML INJ
INTRAVENOUS | Status: AC
  Filled 2021-10-05: qty 5

## 2021-10-05 MED ORDER — CHLORHEXIDINE GLUCONATE 0.12 % MT SOLN
OROMUCOSAL | Status: AC
  Administered 2021-10-05: 15 mL via OROMUCOSAL
  Filled 2021-10-05: qty 15

## 2021-10-05 MED ORDER — PROPOFOL 500 MG/50ML IV EMUL
INTRAVENOUS | Status: DC | PRN
  Administered 2021-10-05: 75 ug/kg/min via INTRAVENOUS

## 2021-10-05 MED ORDER — PHENYLEPHRINE 80 MCG/ML (10ML) SYRINGE FOR IV PUSH (FOR BLOOD PRESSURE SUPPORT)
PREFILLED_SYRINGE | INTRAVENOUS | Status: AC
  Filled 2021-10-05: qty 10

## 2021-10-05 MED ORDER — ONDANSETRON HCL 4 MG/2ML IJ SOLN: 4.0000 mg | Freq: Once | INTRAMUSCULAR | Status: DC | PRN

## 2021-10-05 MED ORDER — DEXAMETHASONE SODIUM PHOSPHATE 10 MG/ML IJ SOLN
INTRAMUSCULAR | Status: DC | PRN
  Administered 2021-10-05: 6 mg via INTRAVENOUS

## 2021-10-05 MED ORDER — ONDANSETRON HCL 4 MG/2ML IJ SOLN
INTRAMUSCULAR | Status: DC | PRN
  Administered 2021-10-05: 4 mg via INTRAVENOUS

## 2021-10-05 MED ORDER — DEXAMETHASONE SODIUM PHOSPHATE 10 MG/ML IJ SOLN
INTRAMUSCULAR | Status: AC
  Filled 2021-10-05: qty 1

## 2021-10-05 MED ORDER — CEFAZOLIN SODIUM-DEXTROSE 2-4 GM/100ML-% IV SOLN
2.0000 g | INTRAVENOUS | Status: AC
  Administered 2021-10-05: 2 g via INTRAVENOUS

## 2021-10-05 MED ORDER — CEFAZOLIN SODIUM-DEXTROSE 2-4 GM/100ML-% IV SOLN
INTRAVENOUS | Status: AC
  Filled 2021-10-05: qty 100

## 2021-10-05 MED ORDER — FENTANYL CITRATE (PF) 100 MCG/2ML IJ SOLN
INTRAMUSCULAR | Status: AC
  Filled 2021-10-05: qty 2

## 2021-10-05 MED ORDER — FENTANYL CITRATE (PF) 100 MCG/2ML IJ SOLN: 25.0000 ug | INTRAMUSCULAR | Status: DC | PRN

## 2021-10-05 MED ORDER — SODIUM CHLORIDE 0.9 % IV SOLN: INTRAVENOUS | Status: DC

## 2021-10-05 MED ORDER — PHENYLEPHRINE HCL (PRESSORS) 10 MG/ML IV SOLN
INTRAVENOUS | Status: DC | PRN
  Administered 2021-10-05: 80 ug via INTRAVENOUS
  Administered 2021-10-05: 120 ug via INTRAVENOUS
  Administered 2021-10-05: 80 ug via INTRAVENOUS
  Administered 2021-10-05: 120 ug via INTRAVENOUS

## 2021-10-05 MED ORDER — CHLORHEXIDINE GLUCONATE 0.12 % MT SOLN: 15.0000 mL | Freq: Once | OROMUCOSAL | Status: AC

## 2021-10-05 MED ORDER — BUPIVACAINE-EPINEPHRINE (PF) 0.5% -1:200000 IJ SOLN
INTRAMUSCULAR | Status: DC | PRN
  Administered 2021-10-05: 30 mL

## 2021-10-05 MED ORDER — LIDOCAINE HCL (CARDIAC) PF 100 MG/5ML IV SOSY
PREFILLED_SYRINGE | INTRAVENOUS | Status: DC | PRN
  Administered 2021-10-05: 80 mg via INTRAVENOUS

## 2021-10-05 MED ORDER — PROPOFOL 10 MG/ML IV BOLUS
INTRAVENOUS | Status: DC | PRN
  Administered 2021-10-05: 120 mg via INTRAVENOUS

## 2021-10-05 MED ORDER — ORAL CARE MOUTH RINSE: 15.0000 mL | Freq: Once | OROMUCOSAL | Status: AC

## 2021-10-05 MED ORDER — FENTANYL CITRATE (PF) 100 MCG/2ML IJ SOLN
INTRAMUSCULAR | Status: DC | PRN
  Administered 2021-10-05 (×3): 25 ug via INTRAVENOUS

## 2021-10-05 MED ORDER — ONDANSETRON HCL 4 MG/2ML IJ SOLN
INTRAMUSCULAR | Status: AC
Start: 2021-10-05 — End: ?
  Filled 2021-10-05: qty 2

## 2021-10-05 MED ORDER — HEMOSTATIC AGENTS (NO CHARGE) OPTIME
TOPICAL | Status: DC | PRN
  Administered 2021-10-05: 1 via TOPICAL

## 2021-10-05 SURGICAL SUPPLY — 28 items
BRIEF MESH DISP 2XL (UNDERPADS AND DIAPERS) ×2 IMPLANT
DRAPE LAPAROTOMY 100X77 ABD (DRAPES) ×2 IMPLANT
DRAPE LEGGINS SURG 28X43 STRL (DRAPES) ×2 IMPLANT
DRAPE UNDER BUTTOCK W/FLU (DRAPES) ×2 IMPLANT
ELECT REM PT RETURN 9FT ADLT (ELECTROSURGICAL) ×1
ELECTRODE REM PT RTRN 9FT ADLT (ELECTROSURGICAL) ×2 IMPLANT
GAUZE 4X4 16PLY ~~LOC~~+RFID DBL (SPONGE) ×2 IMPLANT
GLOVE BIO SURGEON STRL SZ 6.5 (GLOVE) ×2 IMPLANT
GLOVE BIOGEL PI IND STRL 6.5 (GLOVE) ×2 IMPLANT
GLOVE BIOGEL PI INDICATOR 6.5 (GLOVE) ×1
GOWN STRL REUS W/ TWL LRG LVL3 (GOWN DISPOSABLE) ×4 IMPLANT
GOWN STRL REUS W/TWL LRG LVL3 (GOWN DISPOSABLE) ×2
HEMOSTAT SURGICEL 2X14 (HEMOSTASIS) IMPLANT
KIT TURNOVER CYSTO (KITS) ×2 IMPLANT
LABEL OR SOLS (LABEL) ×2 IMPLANT
MANIFOLD NEPTUNE II (INSTRUMENTS) ×2 IMPLANT
NDL SAFETY ECLIP 18X1.5 (MISCELLANEOUS) IMPLANT
NEEDLE HYPO 22GX1.5 SAFETY (NEEDLE) ×2 IMPLANT
PACK BASIN MINOR ARMC (MISCELLANEOUS) ×2 IMPLANT
PAD OB MATERNITY 4.3X12.25 (PERSONAL CARE ITEMS) ×2 IMPLANT
PAD PREP 24X41 OB/GYN DISP (PERSONAL CARE ITEMS) ×2 IMPLANT
STRAP SAFETY 5IN WIDE (MISCELLANEOUS) ×2 IMPLANT
SURGILUBE 2OZ TUBE FLIPTOP (MISCELLANEOUS) ×2 IMPLANT
SUT VIC AB 3-0 SH 27 (SUTURE)
SUT VIC AB 3-0 SH 27X BRD (SUTURE) IMPLANT
SYR 10ML LL (SYRINGE) ×2 IMPLANT
TRAP FLUID SMOKE EVACUATOR (MISCELLANEOUS) ×2 IMPLANT
WATER STERILE IRR 500ML POUR (IV SOLUTION) ×2 IMPLANT

## 2021-10-05 NOTE — H&P (Signed)
History of Present Illness Annette Foster is a 81 y.o. female with perianal pain for the last few weeks.  The study having difficulty sitting down.  She was evaluated GI.  She had a short sigmoidoscopy without any obvious mass.  There was a bulging on the distal rectal canal.  There was evaluated with endorectal ultrasound which shows a 1 cm fluid collection.  CT scan of the pelvis also shows a 1 cm fluid collection.  Past Medical History Past Medical History:  Diagnosis Date   Abdominal pain    Allergy    Cancer ([place]) 2017   breast cancer- Left   Cataract    CHF (congestive heart failure) (HCC)    Collagenous colitis    Diabetes mellitus without complication (HCC)    Diarrhea    Diverticulosis    Dysrhythmia    Fatty liver disease, nonalcoholic    Fibrocystic breast    GERD (gastroesophageal reflux disease)    Heart murmur    Hyperlipidemia    Hypertension    Hypothyroidism    IBS (irritable bowel syndrome)    IDA (iron deficiency anemia)    Personal history of radiation therapy 2017   LEFT BREAST CA   PONV (postoperative nausea and vomiting)    Presence of permanent cardiac pacemaker    Sleep apnea    C-Pap       Past Surgical History:  Procedure Laterality Date   ABDOMINAL HYSTERECTOMY     [place] bso   ABDOMINAL SURGERY     APPENDECTOMY     AV NODE ABLATION N/A 05/17/2021   Procedure: AV NODE ABLATION;  Surgeon: Vickie Epley, MD;  Location: [place] [facility];  Service: Cardiovascular;  Laterality: N/A;   BREAST BIOPSY Bilateral 2016   negative   BREAST 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/2015   DCIS, papillary carcinoma in situ   BREAST BIOPSY Left 05/27/2016   BENIGN MAMMARY EPITHELIUM   BREAST BIOPSY Left 11/20/2017   affirm bx x clip BENIGN MAMMARY EPITHELIUM CONSISTENT WITH RAD THERAPY   BREAST EXCISIONAL BIOPSY Right    NEG 1980's   BREAST LUMPECTOMY Left 10/17/2015   DCIS and papillary carcinoma insitu, clear margins   CARDIAC SURGERY     has replacement valve    CATARACT EXTRACTION Right    CATARACT EXTRACTION W/PHACO Left 01/16/2021   Procedure: CATARACT EXTRACTION PHACO AND INTRAOCULAR LENS PLACEMENT (IOC) LEFT DIABETIC 8.46 00:59.8;  Surgeon: Birder Robson, MD;  Location: [place];  Service: Ophthalmology;  Laterality: Left;   CHOLECYSTECTOMY     COLONOSCOPY WITH PROPOFOL N/A 03/11/2016   Procedure: COLONOSCOPY WITH PROPOFOL;  Surgeon: Manya Silvas, MD;  Location: [facility] ENDOSCOPY;  Service: Endoscopy;  Laterality: N/A;   COLONOSCOPY WITH PROPOFOL N/A 02/18/2020   Procedure: COLONOSCOPY WITH PROPOFOL;  Surgeon: [name], MD;  Location: [facility] ENDOSCOPY;  Service: Endoscopy;  Laterality: N/A;   ESOPHAGOGASTRODUODENOSCOPY (EGD) WITH PROPOFOL N/A 03/11/2016   Procedure: ESOPHAGOGASTRODUODENOSCOPY (EGD) WITH PROPOFOL;  Surgeon: Manya Silvas, MD;  Location: [facility] ENDOSCOPY;  Service: Endoscopy;  Laterality: N/A;   ESOPHAGOGASTRODUODENOSCOPY (EGD) WITH PROPOFOL N/A 02/18/2020   Procedure: ESOPHAGOGASTRODUODENOSCOPY (EGD) WITH PROPOFOL;  Surgeon: [name], MD;  Location: [facility] ENDOSCOPY;  Service: Endoscopy;  Laterality: N/A;   ESOPHAGOGASTRODUODENOSCOPY (EGD) WITH PROPOFOL N/A 04/25/2020   Procedure: ESOPHAGOGASTRODUODENOSCOPY (EGD) WITH PROPOFOL;  Surgeon: [name], MD;  Location: [facility] ENDOSCOPY;  Service: Endoscopy;  Laterality: N/A;   ESOPHAGOGASTRODUODENOSCOPY (EGD) WITH PROPOFOL N/A 05/23/2020   Procedure: ESOPHAGOGASTRODUODENOSCOPY (EGD) WITH PROPOFOL;  Surgeon: [name], MD;  Location: [facility] ENDOSCOPY;  Service: Endoscopy;  Laterality: N/A;   FINGER ARTHROSCOPY WITH CARPOMETACARPEL ([place]) ARTHROPLASTY Right 09/03/2018   Procedure: CARPOMETACARPEL [facility]) ARTHROPLASTY RIGHT THUMB;  Surgeon: Hessie Knows, MD;  Location: [facility] ORS;  Service: Orthopedics;  Laterality: Right;   FLEXIBLE SIGMOIDOSCOPY N/A 09/14/2021   Procedure: FLEXIBLE SIGMOIDOSCOPY;  Surgeon: [name], MD;  Location: [facility] ENDOSCOPY;  Service: Endoscopy;   Laterality: N/A;  No anesthesia   GANGLION CYST EXCISION Right 09/03/2018   Procedure: REMOVAL GANGLION OF WRIST;  Surgeon: Hessie Knows, MD;  Location: [facility] ORS;  Service: Orthopedics;  Laterality: Right;   HARDWARE REMOVAL Right 09/03/2018   Procedure: HARDWARE REMOVAL RIGHT THUMB;  Surgeon: Hessie Knows, MD;  Location: [facility];  Service: Orthopedics;  Laterality: Right;  staple removed   JOINT REPLACEMENT Left    TKR   left sinusplasty      MASTECTOMY, PARTIAL Left 10/17/2015   Procedure: MASTECTOMY PARTIAL REVISION;  Surgeon: Leonie Green, MD;  Location: [place] [facility];  Service: General;  Laterality: Left;   PACEMAKER IMPLANT N/A 03/23/2021   Procedure: PACEMAKER IMPLANT;  Surgeon: Vickie Epley, MD;  Location: [place] [facility];  Service: Cardiovascular;  Laterality: N/A;   PARTIAL MASTECTOMY WITH NEEDLE LOCALIZATION Left 09/29/2015   Procedure: PARTIAL MASTECTOMY WITH NEEDLE LOCALIZATION;  Surgeon: Leonie Green, MD;  Location: [facility];  Service: General;  Laterality: Left;   TOTAL ABDOMINAL HYSTERECTOMY W/ BILATERAL SALPINGOOPHORECTOMY      Allergies  Allergen Reactions   Codeine Itching and Nausea And Vomiting   Demeclocycline Rash   Demerol [Meperidine] Itching and Nausea And Vomiting   Hydrocodone Itching and Nausea And Vomiting   Oxycodone Itching and Nausea And Vomiting   Pentazocine Itching and Nausea And Vomiting   Tetracyclines & Related Rash   Fentanyl Itching and Nausea And Vomiting    D/W surgical nurse on 7/23 and pt reportedly tolerated dose during surgery.  Dose was given by MD.  Pt reported that she only gets itching and did not object to receiving med    Current Facility-Administered Medications  Medication Dose Route Frequency Provider Last Rate Last Admin   0.9 %  sodium chloride infusion   Intravenous Continuous Dimas Millin, MD 100 mL/hr at 10/05/21 0948 New Bag at 10/05/21 0948   ceFAZolin (ANCEF) 2-4 GM/100ML-% IVPB             ceFAZolin (ANCEF) IVPB 2g/100 mL premix  2 g Intravenous On Call to OR Herbert Pun, MD        Family History Family History  Problem Relation Age of Onset   Breast cancer Paternal Grandmother    Colon cancer Father    Diabetes Sister    Diabetes Brother    Heart disease Brother    Prostate cancer Brother    Colon cancer Maternal Uncle    Prostate cancer Brother    Bladder Cancer Brother    Leukemia Mother        all   Ovarian cancer Neg Hx    Kidney cancer Neg Hx        Social History Social History   Tobacco Use   Smoking status: Never   Smokeless tobacco: Never  Vaping Use   Vaping Use: Never used  Substance Use Topics   Alcohol use: No    Alcohol/week: 0.0 standard drinks of alcohol   Drug use: No        ROS Full ROS of systems performed and is  otherwise negative there than what is stated in the HPI  Physical Exam Blood pressure (!) 141/74, pulse 66, temperature 98.9 F (37.2 C), temperature source Temporal, resp. rate 18, height 5' 3"  (1.6 m), weight 64.5 kg, SpO2 100 %.  CONSTITUTIONAL: Alert, oriented x3 EYES: Pupils equal, round, and reactive to light, Sclera non-icteric. EARS, NOSE, MOUTH AND THROAT: The oropharynx is clear. Oral mucosa is pink and moist. Hearing is intact to voice.  NECK: Trachea is midline, and there is no jugular venous distension. Thyroid is without palpable abnormalities. LYMPH NODES:  Lymph nodes in the neck are not enlarged. RESPIRATORY:  Lungs are clear, and breath sounds are equal bilaterally. Normal respiratory effort without pathologic use of accessory muscles. CARDIOVASCULAR: Heart is regular without murmurs, gallops, or rubs. GI: The abdomen is soft, nontender, and nondistended. There were no palpable masses. There was no hepatosplenomegaly. There were normal bowel sounds. RECTA: Pain on the tolerated exam.  Bulging on left upper rectal canal MUSCULOSKELETAL:  Normal muscle strength and tone in all four extremities.     SKIN: Skin turgor is normal. There are no pathologic skin lesions.  NEUROLOGIC:  Motor and sensation is grossly normal.  Cranial nerves are grossly intact. PSYCH:  Alert and oriented to person, place and time. Affect is normal.  Data Reviewed Ambulated the CT scan of the pelvis.  I personally evaluated the right and the rectal ultrasound.  I have personally reviewed the patient's imaging and medical records.    Assessment    Perirectal fluid collection Perirectal pain  Unknown cause of perirectal pain.  [place]iba anoscopy, CT scan of the pelvis and in the rectal ultrasound negative for malignancy.  Patient endorses that she is feeling better with nifedipine compound cream.  We will do rectal exam anesthesia for possible drainage of fluid collection.  Plan    Rectal exam and anesthesia and possible drainage of fluid collection.Herbert Pun, MD  Herbert Pun 10/05/2021, 10:06 AM

## 2021-10-05 NOTE — Anesthesia Procedure Notes (Signed)
Procedure Name: LMA Insertion Date/Time: 10/05/2021 10:30 AM  Performed by: Demetrius Charity, CRNAPre-anesthesia Checklist: Patient identified, Patient being monitored, Timeout performed, Emergency Drugs available and Suction available Patient Re-evaluated:Patient Re-evaluated prior to induction Oxygen Delivery Method: Circle system utilized Preoxygenation: Pre-oxygenation with 100% oxygen Induction Type: IV induction Ventilation: Mask ventilation without difficulty LMA: LMA inserted LMA Size: 3.0 Tube type: Oral Number of attempts: 1 Placement Confirmation: positive ETCO2 and breath sounds checked- equal and bilateral Tube secured with: Tape Dental Injury: Teeth and Oropharynx as per pre-operative assessment

## 2021-10-05 NOTE — Op Note (Addendum)
Preoperative diagnosis: Perirectal abscess.                                             Perianal pain  Postoperative diagnosis: Perirectal fluid collection.  Procedure: Rectal exam under anesthesia                     Aspiration of perirectal cyst  Surgeon: Dr. Windell Moment  Anesthesia: General LMA  Indications: Patient is a 81 y.o. femalewas found to have palpable perirectal fluid mass confirmed by CT of pelvis and endorectal ultrasound. No suspicious malignancy. Suspected abscess.   Findings: A 1 cm palpable intramucosal cystic mass.  Aspiration of cystic mass with mucous/gelatinous appearance.  No pus  Description of procedure: The patient was brought to the operating room and spinal anesthesia was induced. The patient was then repositioned in Lithotomy position. A time-out was completed verifying correct patient, procedure, site, positioning, and implant(s) and/or special equipment prior to beginning this procedure. A rectal examination was performed and the cystic mass was identified and confirmed to be submucosal . The patient was prepped and draped in the usual sterile fashion. Local anesthetic was injected as a perianal block. Anoscopy was performed. The palpable area was aspirated using an 18-gauge needle and the presence of mucous/gelatinous fluid was encountered.   The cystic mass was completely aspirated and unable to be palpated.  The patient tolerated the procedure well and was extubated and taken to the postanesthesia care unit in stable condition.   Specimen: None  Complications: None  Estimated Blood Loss: minimal

## 2021-10-05 NOTE — Discharge Instructions (Addendum)
  Diet: Resume home heart healthy regular diet.   Activity: No activity restriction  Wound care: No wound care needed. May use plain guaze between buttocks in there is any bleeding.   Medications: Resume all home medications except. For mild to moderate pain: acetaminophen (Tylenol). Continue applying Nifedipine compound ointment for pain.   Call office [phone]) at any time if any questions, worsening pain, fevers/chills, bleeding, drainage from incision site, or other concerns.   AMBULATORY SURGERY  DISCHARGE INSTRUCTIONS   The drugs that you were given will stay in your system until tomorrow so for the next 24 hours you should not:  Drive an automobile Make any legal decisions Drink any alcoholic beverage   You may resume regular meals tomorrow.  Today it is better to start with liquids and gradually work up to solid foods.  You may eat anything you prefer, but it is better to start with liquids, then soup and crackers, and gradually work up to solid foods.   Please notify your doctor immediately if you have any unusual bleeding, trouble breathing, redness and pain at the surgery site, drainage, fever, or pain not relieved by medication.    Additional Instructions:    Please contact your physician with any problems or Same Day Surgery at [phone], Monday through Friday 6 am to 4 pm, or [place] at [facility] number at [phone].

## 2021-10-05 NOTE — Transfer of Care (Signed)
Immediate Anesthesia Transfer of Care Note  Patient: Annette Foster  Procedure(s) Performed: RECTAL EXAM UNDER ANESTHESIA, ASPIRATION OF RECTAL CYST (Rectum)  Patient Location: PACU  Anesthesia Type:General  Level of Consciousness: drowsy  Airway & Oxygen Therapy: Patient Spontanous Breathing and Patient connected to face mask oxygen  Post-op Assessment: Report given to RN and Post -op Vital signs reviewed and stable  Post vital signs: Reviewed and stable  Last Vitals:  Vitals Value Taken Time  BP 198/63 10/05/21 1116  Temp 36.9 C 10/05/21 1116  Pulse 80 10/05/21 1123  Resp 14 10/05/21 1123  SpO2 96 % 10/05/21 1123  Vitals shown include unvalidated device data.  Last Pain:  Vitals:   10/05/21 0933  TempSrc: Temporal  PainSc: 3          Complications: No notable events documented.

## 2021-10-05 NOTE — Anesthesia Preprocedure Evaluation (Signed)
Anesthesia Evaluation  Patient identified by MRN, date of birth, ID band Patient awake    Reviewed: Allergy & Precautions, H&P , NPO status , Patient's Chart, lab work & pertinent test results, reviewed documented beta blocker date and time   History of Anesthesia Complications (+) PONV and history of anesthetic complications  Airway Mallampati: II  TM Distance: >3 FB Neck ROM: full    Dental  (+) Teeth Intact   Pulmonary neg pulmonary ROS, sleep apnea ,    Pulmonary exam normal        Cardiovascular Exercise Tolerance: Poor hypertension, +CHF  negative cardio ROS Normal cardiovascular exam+ dysrhythmias + pacemaker + Valvular Problems/Murmurs  Rate:Normal     Neuro/Psych negative neurological ROS  negative psych ROS   GI/Hepatic negative GI ROS, Neg liver ROS, GERD  ,(+) Hepatitis -  Endo/Other  negative endocrine ROSdiabetesHypothyroidism   Renal/GU Renal diseasenegative Renal ROS  negative genitourinary   Musculoskeletal   Abdominal   Peds  Hematology negative hematology ROS (+) Blood dyscrasia, anemia ,   Anesthesia Other Findings   Reproductive/Obstetrics negative OB ROS                             Anesthesia Physical Anesthesia Plan  ASA: 3  Anesthesia Plan: General LMA   Post-op Pain Management:    Induction:   PONV Risk Score and Plan: 4 or greater  Airway Management Planned:   Additional Equipment:   Intra-op Plan:   Post-operative Plan:   Informed Consent: I have reviewed the patients History and Physical, chart, labs and discussed the procedure including the risks, benefits and alternatives for the proposed anesthesia with the patient or authorized representative who has indicated his/her understanding and acceptance.       Plan Discussed with: CRNA  Anesthesia Plan Comments:         Anesthesia Quick Evaluation

## 2021-10-06 NOTE — Anesthesia Postprocedure Evaluation (Signed)
Anesthesia Post Note  Patient: Anjolina Byrer  Procedure(s) Performed: RECTAL EXAM UNDER ANESTHESIA, ASPIRATION OF RECTAL CYST (Rectum)  Patient location during evaluation: PACU Anesthesia Type: General Level of consciousness: awake and alert Pain management: pain level controlled Vital Signs Assessment: post-procedure vital signs reviewed and stable Respiratory status: spontaneous breathing, nonlabored ventilation, respiratory function stable and patient connected to nasal cannula oxygen Cardiovascular status: blood pressure returned to baseline and stable Postop Assessment: no apparent nausea or vomiting Anesthetic complications: no   No notable events documented.   Last Vitals:  Vitals:   10/05/21 1215 10/05/21 1220  BP: 133/66 129/62  Pulse: 81 74  Resp: 14 15  Temp: 36.4 C (!) 36.2 C  SpO2: 97% 99%    Last Pain:  Vitals:   10/05/21 1220  TempSrc: Temporal  PainSc: 0-No pain                 Molli Barrows

## 2021-10-06 NOTE — Anesthesia Postprocedure Evaluation (Signed)
Anesthesia Post Note  Patient: [name]  Procedure(s) Performed: LOWER ENDOSCOPIC ULTRASOUND (EUS)  Patient location during evaluation: PACU Anesthesia Type: General Level of consciousness: awake and alert Pain management: pain level controlled Vital Signs Assessment: post-procedure vital signs reviewed and stable Respiratory status: spontaneous breathing, nonlabored ventilation, respiratory function stable and patient connected to nasal cannula oxygen Cardiovascular status: blood pressure returned to baseline and stable Postop Assessment: no apparent nausea or vomiting Anesthetic complications: no   No notable events documented.   Last Vitals:  Vitals:   10/04/21 1433 10/04/21 1443  BP: (!) 136/109 (!) 127/59  Pulse:    Resp:    Temp:    SpO2:      Last Pain:  Vitals:   10/05/21 0730  TempSrc:   PainSc: 0-No pain                 Molli Barrows

## 2021-10-07 ENCOUNTER — Encounter: Payer: Self-pay | Admitting: General Surgery

## 2021-10-10 LAB — AEROBIC/ANAEROBIC CULTURE W GRAM STAIN (SURGICAL/DEEP WOUND): Culture: NO GROWTH

## 2021-10-16 NOTE — Progress Notes (Signed)
Remote pacemaker transmission.   

## 2021-10-17 ENCOUNTER — Encounter: Payer: Self-pay | Admitting: Internal Medicine

## 2021-10-17 ENCOUNTER — Inpatient Hospital Stay: Payer: Medicare PPO

## 2021-10-17 ENCOUNTER — Inpatient Hospital Stay (HOSPITAL_BASED_OUTPATIENT_CLINIC_OR_DEPARTMENT_OTHER): Payer: Medicare PPO | Admitting: Internal Medicine

## 2021-10-17 ENCOUNTER — Inpatient Hospital Stay: Payer: Medicare PPO | Attending: Internal Medicine

## 2021-10-17 VITALS — BP 90/50 | HR 101 | Temp 97.2°F | Ht 63.0 in | Wt 150.8 lb

## 2021-10-17 DIAGNOSIS — D61818 Other pancytopenia: Secondary | ICD-10-CM

## 2021-10-17 DIAGNOSIS — E611 Iron deficiency: Secondary | ICD-10-CM

## 2021-10-17 DIAGNOSIS — K922 Gastrointestinal hemorrhage, unspecified: Secondary | ICD-10-CM | POA: Diagnosis present

## 2021-10-17 DIAGNOSIS — K7581 Nonalcoholic steatohepatitis (NASH): Secondary | ICD-10-CM

## 2021-10-17 DIAGNOSIS — K7460 Unspecified cirrhosis of liver: Secondary | ICD-10-CM

## 2021-10-17 DIAGNOSIS — D500 Iron deficiency anemia secondary to blood loss (chronic): Secondary | ICD-10-CM | POA: Insufficient documentation

## 2021-10-17 LAB — CBC WITH DIFFERENTIAL/PLATELET
Abs Immature Granulocytes: 0 K/uL (ref 0.00–0.07)
Basophils Absolute: 0 K/uL (ref 0.0–0.1)
Basophils Relative: 1 %
Eosinophils Absolute: 0.1 K/uL (ref 0.0–0.5)
Eosinophils Relative: 4 %
HCT: 31.2 % — ABNORMAL LOW (ref 36.0–46.0)
Hemoglobin: 10.8 g/dL — ABNORMAL LOW (ref 12.0–15.0)
Immature Granulocytes: 0 %
Lymphocytes Relative: 32 %
Lymphs Abs: 0.6 K/uL — ABNORMAL LOW (ref 0.7–4.0)
MCH: 31.3 pg (ref 26.0–34.0)
MCHC: 34.6 g/dL (ref 30.0–36.0)
MCV: 90.4 fL (ref 80.0–100.0)
Monocytes Absolute: 0.2 K/uL (ref 0.1–1.0)
Monocytes Relative: 11 %
Neutro Abs: 1 K/uL — ABNORMAL LOW (ref 1.7–7.7)
Neutrophils Relative %: 52 %
Platelets: 62 K/uL — ABNORMAL LOW (ref 150–400)
RBC: 3.45 MIL/uL — ABNORMAL LOW (ref 3.87–5.11)
RDW: 13.9 % (ref 11.5–15.5)
WBC: 1.9 K/uL — ABNORMAL LOW (ref 4.0–10.5)
nRBC: 0 % (ref 0.0–0.2)

## 2021-10-17 LAB — COMPREHENSIVE METABOLIC PANEL WITH GFR
ALT: 16 U/L (ref 0–44)
AST: 26 U/L (ref 15–41)
Albumin: 3.7 g/dL (ref 3.5–5.0)
Alkaline Phosphatase: 54 U/L (ref 38–126)
Anion gap: 7 (ref 5–15)
BUN: 12 mg/dL (ref 8–23)
CO2: 28 mmol/L (ref 22–32)
Calcium: 8.8 mg/dL — ABNORMAL LOW (ref 8.9–10.3)
Chloride: 103 mmol/L (ref 98–111)
Creatinine, Ser: 0.78 mg/dL (ref 0.44–1.00)
GFR, Estimated: >60 mL/min (ref >60)
Glucose, Bld: 205 mg/dL — ABNORMAL HIGH (ref 70–99)
Potassium: 3.9 mmol/L (ref 3.5–5.1)
Sodium: 138 mmol/L (ref 135–145)
Total Bilirubin: 0.7 mg/dL (ref 0.3–1.2)
Total Protein: 7.1 g/dL (ref 6.5–8.1)

## 2021-10-17 MED ORDER — SODIUM CHLORIDE 0.9 % IV SOLN
Freq: Once | INTRAVENOUS | Status: AC
  Filled 2021-10-17: qty 250

## 2021-10-17 MED ORDER — SODIUM CHLORIDE 0.9 % IV SOLN
200.0000 mg | Freq: Once | INTRAVENOUS | Status: AC
  Administered 2021-10-17: 200 mg via INTRAVENOUS
  Filled 2021-10-17: qty 200

## 2021-10-17 NOTE — Progress Notes (Signed)
Concerned with platelets being low.

## 2021-10-17 NOTE — Assessment & Plan Note (Addendum)
#   Anemia-secondary GI bleed chronic/liver disease.  Hemoglobin-10-11.  Proceed with IV iron infusion.  # AUG 2023- intramucosal cystic mass [Dr.Wohl] s/p sigmoidoscopy [09/14/2021]- s/p palpable; status post aspiration of cystic mass with mucous/gelatinous appearance. No pus-benign etiology.  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  ANC-900; Platelets 60- 70s. STABLE.   # Tachy-brady syndrome- Hx of A.fib [awaiting evaluation with Dr.End]-defer to cardiology regarding management.  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 BIL Mammogram JAN 2023 [Dr.Cintron]-within normal limits.  Not on AI.  Stable.   # DISPOSITION:  # Venofer today # follow up in 4 month- MD; labs- cbc/cmp;iron studies;ferritin-  possible Venofer-Dr.B

## 2021-10-17 NOTE — Progress Notes (Signed)
[place] OFFICE PROGRESS NOTE  Patient Care Team: Albina Billet, MD as PCP - General (Internal Medicine) End, Harrell Gave, MD as PCP - Cardiology (Cardiology) Vickie Epley, MD as PCP - Electrophysiology (Cardiology) Cammie Sickle, MD as Consulting Physician (Internal Medicine)   Cancer Staging  No matching staging information was found for the patient.   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 OCT 2018- BMBx- MILD dyspoiesis; variable cellularity [10-50%]; FISH/Cytogenetics-NORMAL; F-One-NGS-declined by insurance.    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   #Frequent UTIs [2019-2020; Dr. Brandon/uro-gyne,[facility]]; Dr.Wohl/  # HRT [clinical trial thru [facility]; stopped July 2017 ]; CPAP   DIAGNOSIS: Left breast DCIS  STAGE:    0     ;GOALS: cure  CURRENT/MOST RECENT THERAPY: surveillaince    Ductal carcinoma in situ (DCIS) of left breast    INTERVAL HISTORY: Alone.  Ambulating independently.  Annette Foster 81 y.o.  female pleasant patient above history of DCIS; cirrhosis portal hypertension; A. fib and pancytopenia is here for follow-up.  In the interim underwent evaluation with endoscopic ultrasound for a rectal mass.  Underwent aspiration with Dr. Lance Morin for malignancy.  Denies any swelling in the legs.  Denies any shortness of breath or cough.  Complains of tiredness.  Denies any ongoing bleeding.   Review of Systems  Constitutional:  Positive for  malaise/fatigue. Negative for chills, diaphoresis, fever and weight loss.  HENT:  Negative for nosebleeds and sore throat.   Eyes:  Negative for double vision.  Respiratory:  Negative for cough, hemoptysis, sputum production, shortness of breath and wheezing.   Cardiovascular:  Negative for chest pain, palpitations, orthopnea and leg swelling.  Gastrointestinal:  Negative for abdominal pain, blood in stool, constipation, diarrhea, heartburn, melena, nausea and vomiting.  Musculoskeletal:  Positive for back pain and joint pain.  Skin: Negative.  Negative for itching and rash.  Neurological:  Negative for dizziness, tingling, focal weakness, weakness and headaches.  Endo/Heme/Allergies:  Does not bruise/bleed easily.  Psychiatric/Behavioral:  Negative for depression. The patient is not nervous/anxious and does not have insomnia.       PAST MEDICAL HISTORY :  Past Medical History:  Diagnosis Date   Abdominal pain    Allergy    Cancer ([place]) 2017   breast cancer- Left   Cataract    CHF (congestive heart failure) (HCC)    Collagenous colitis    Diabetes mellitus without complication (HCC)    Diarrhea    Diverticulosis    Dysrhythmia    Fatty liver disease, nonalcoholic    Fibrocystic breast    GERD (gastroesophageal reflux disease)    Heart murmur    Hyperlipidemia    Hypertension    Hypothyroidism    IBS (irritable bowel syndrome)    IDA (iron deficiency anemia)    Personal history of radiation therapy 2017   LEFT BREAST CA   PONV (postoperative nausea and vomiting)    Presence of permanent cardiac pacemaker    Sleep apnea    C-Pap    PAST SURGICAL HISTORY :   Past Surgical History:  Procedure Laterality Date   ABDOMINAL HYSTERECTOMY     [place] bso   ABDOMINAL SURGERY     APPENDECTOMY     AV NODE ABLATION N/A 05/17/2021   Procedure: AV NODE ABLATION;  Surgeon: Vickie Epley, MD;  Location: [facility];  Service: Cardiovascular;  Laterality: N/A;   BREAST BIOPSY  Bilateral 2016   negative   BREAST 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/2015   DCIS, papillary carcinoma in situ   BREAST BIOPSY Left 05/27/2016   BENIGN MAMMARY EPITHELIUM   BREAST BIOPSY Left 11/20/2017   affirm bx x clip BENIGN MAMMARY EPITHELIUM CONSISTENT WITH RAD THERAPY   BREAST EXCISIONAL BIOPSY Right    NEG 1980's   BREAST LUMPECTOMY Left 10/17/2015   DCIS and papillary carcinoma insitu, clear margins   CARDIAC SURGERY     has replacement valve   CATARACT EXTRACTION Right    CATARACT EXTRACTION W/PHACO Left 01/16/2021   Procedure: CATARACT EXTRACTION PHACO AND INTRAOCULAR LENS PLACEMENT (IOC) LEFT DIABETIC 8.46 00:59.8;  Surgeon: Birder Robson, MD;  Location: [facility];  Service: Ophthalmology;  Laterality: Left;   CHOLECYSTECTOMY     COLONOSCOPY WITH PROPOFOL N/A 03/11/2016   Procedure: COLONOSCOPY WITH PROPOFOL;  Surgeon: Manya Silvas, MD;  Location: [facility] ENDOSCOPY;  Service: Endoscopy;  Laterality: N/A;   COLONOSCOPY WITH PROPOFOL N/A 02/18/2020   Procedure: COLONOSCOPY WITH PROPOFOL;  Surgeon: [name], MD;  Location: [facility] ENDOSCOPY;  Service: Endoscopy;  Laterality: N/A;   ESOPHAGOGASTRODUODENOSCOPY (EGD) WITH PROPOFOL N/A 03/11/2016   Procedure: ESOPHAGOGASTRODUODENOSCOPY (EGD) WITH PROPOFOL;  Surgeon: Manya Silvas, MD;  Location[facility] ENDOSCOPY;  Service: Endoscopy;  Laterality: N/A;   ESOPHAGOGASTRODUODENOSCOPY (EGD) WITH PROPOFOL N/A 02/18/2020   Procedure: ESOPHAGOGASTRODUODENOSCOPY (EGD) WITH PROPOFOL;  Surgeon: [name], MD;  Location: [facility] ENDOSCOPY;  Service: Endoscopy;  Laterality: N/A;   ESOPHAGOGASTRODUODENOSCOPY (EGD) WITH PROPOFOL N/A 04/25/2020   Procedure: ESOPHAGOGASTRODUODENOSCOPY (EGD) WITH PROPOFOL;  Surgeon: [name], MD;  Location: [facility] ENDOSCOPY;  Service: Endoscopy;  Laterality: N/A;   ESOPHAGOGASTRODUODENOSCOPY (EGD) WITH PROPOFOL N/A 05/23/2020   Procedure: ESOPHAGOGASTRODUODENOSCOPY (EGD) WITH PROPOFOL;  Surgeon: [name], MD;   Location: [facility] ENDOSCOPY;  Service: Endoscopy;  Laterality: N/A;   EUS N/A 10/04/2021   Procedure: LOWER ENDOSCOPIC ULTRASOUND (EUS);  Surgeon: Reita Cliche, MD;  Location: [facility] ENDOSCOPY;  Service: Gastroenterology;  Laterality: N/A;  [facility]   FINGER ARTHROSCOPY WITH CARPOMETACARPEL ([place]) ARTHROPLASTY Right 09/03/2018   Procedure: CARPOMETACARPEL [facility]) ARTHROPLASTY RIGHT THUMB;  Surgeon: Hessie Knows, MD;  Location: [facility] ORS;  Service: Orthopedics;  Laterality: Right;   FLEXIBLE SIGMOIDOSCOPY N/A 09/14/2021   Procedure: FLEXIBLE SIGMOIDOSCOPY;  Surgeon: [name], MD;  Location: [facility] ENDOSCOPY;  Service: Endoscopy;  Laterality: N/A;  No anesthesia   GANGLION CYST EXCISION Right 09/03/2018   Procedure: REMOVAL GANGLION OF WRIST;  Surgeon: Hessie Knows, MD;  Location: [facility] ORS;  Service: Orthopedics;  Laterality: Right;   HARDWARE REMOVAL Right 09/03/2018   Procedure: HARDWARE REMOVAL RIGHT THUMB;  Surgeon: Hessie Knows, MD;  Location: [facility] ORS;  Service: Orthopedics;  Laterality: Right;  staple removed   JOINT REPLACEMENT Left    TKR   left sinusplasty      MASTECTOMY, PARTIAL Left 10/17/2015   Procedure: MASTECTOMY PARTIAL REVISION;  Surgeon: Leonie Green, MD;  Location: [facility];  Service: General;  Laterality: Left;   PACEMAKER IMPLANT N/A 03/23/2021   Procedure: PACEMAKER IMPLANT;  Surgeon: Vickie Epley, MD;  Location: [place];  Service: Cardiovascular;  Laterality: N/A;  PARTIAL MASTECTOMY WITH NEEDLE LOCALIZATION Left 09/29/2015   Procedure: PARTIAL MASTECTOMY WITH NEEDLE LOCALIZATION;  Surgeon: Leonie Green, MD;  Location: [facility];  Service: General;  Laterality: Left;   RECTAL EXAM UNDER ANESTHESIA N/A 10/05/2021   Procedure: RECTAL EXAM UNDER ANESTHESIA, ASPIRATION OF RECTAL CYST;  Surgeon: Herbert Pun, MD;  Location: [facility] ORS;  Service: General;  Laterality: N/A;   TOTAL ABDOMINAL HYSTERECTOMY W/ BILATERAL SALPINGOOPHORECTOMY       FAMILY HISTORY :   Family History  Problem Relation Age of Onset   Breast cancer Paternal Grandmother    Colon cancer Father    Diabetes Sister    Diabetes Brother    Heart disease Brother    Prostate cancer Brother    Colon cancer Maternal Uncle    Prostate cancer Brother    Bladder Cancer Brother    Leukemia Mother        all   Ovarian cancer Neg Hx    Kidney cancer Neg Hx     SOCIAL HISTORY:   Social History   Tobacco Use   Smoking status: Never   Smokeless tobacco: Never  Vaping Use   Vaping Use: Never used  Substance Use Topics   Alcohol use: No    Alcohol/week: 0.0 standard drinks of alcohol   Drug use: No    ALLERGIES:  is allergic to codeine, demeclocycline, demerol [meperidine], hydrocodone, oxycodone, pentazocine, tetracyclines & related, and fentanyl.  MEDICATIONS:  Current Outpatient Medications  Medication Sig Dispense Refill   acetaminophen (TYLENOL) 325 MG tablet Take 1-2 tablets (325-650 mg total) by mouth every 4 (four) hours as needed for mild pain.     Cholecalciferol (VITAMIN D) 50 MCG (2000 UT) CAPS Take 2,000 Units by mouth daily.     ciprofloxacin (CIPRO) 500 MG tablet Take 500 mg by mouth 2 (two) times daily.     clobetasol ointment (TEMOVATE) 9.70 % Apply 1 application topically as needed (vaginal irritation).     clotrimazole (LOTRIMIN) 1 % cream Apply 1 Application topically 2 (two) times daily as needed.     cyanocobalamin (,VITAMIN B-12,) 1000 MCG/ML injection 1,000 mcg every 30 (thirty) days.     dicyclomine (BENTYL) 10 MG capsule Take 10 mg by mouth in the morning, at noon, and at bedtime.     docusate sodium (COLACE) 100 MG capsule Take 100 mg by mouth 3 (three) times daily as needed for moderate constipation.     EPINEPHrine 0.3 mg/0.3 mL IJ SOAJ injection Inject 0.3 mg into the muscle as needed for anaphylaxis.     esomeprazole (NEXIUM) 40 MG capsule Take 40 mg by mouth 2 (two) times daily before a meal.      estradiol (ESTRACE)  0.1 MG/GM vaginal cream Place 1 Applicatorful vaginally daily as needed (irritation).     fenofibrate 160 MG tablet Take 160 mg by mouth at bedtime.     gabapentin (NEURONTIN) 300 MG capsule Take 300 mg by mouth at bedtime.      hydrOXYzine (ATARAX/VISTARIL) 25 MG tablet Take 25 mg by mouth every 6 (six) hours as needed for anxiety, itching, nausea or vomiting.     Insulin Degludec (TRESIBA) 100 UNIT/ML SOLN Inject 174 Units into the skin at bedtime.     levocetirizine (XYZAL) 5 MG tablet Take 5 mg by mouth every evening.     levothyroxine (SYNTHROID, LEVOTHROID) 75 MCG tablet Take 75 mcg by mouth daily before breakfast.      magnesium oxide (MAG-OX) 400 MG tablet Take  400 mg by mouth 2 (two) times daily.      nadolol (CORGARD) 20 MG tablet Take 0.5 tablets (10 mg total) by mouth daily. (Patient taking differently: Take 20 mg by mouth daily.) 45 tablet 0   NONFORMULARY OR COMPOUNDED ITEM Nifedipine 0.3% plus lidocaine 2% cream apply to rectum BID and after every BM.     NOVOLOG FLEXPEN 100 UNIT/ML FlexPen Inject 48-68 Units into the skin 3 (three) times daily with meals. Based on sliding scale     potassium chloride SA (K-DUR,KLOR-CON) 20 MEQ tablet Take 20 mEq by mouth 2 (two) times daily.      saccharomyces boulardii (FLORASTOR) 250 MG capsule Take 250 mg by mouth 2 (two) times daily.      simvastatin (ZOCOR) 20 MG tablet Take 20 mg by mouth at bedtime.     spironolactone (ALDACTONE) 25 MG tablet TAKE 1/2 TABLET BY MOUTH EVERY DAY 45 tablet 1   tirzepatide (MOUNJARO) 12.5 MG/0.5ML Pen Inject 12.5 mg into the skin once a week.     torsemide (DEMADEX) 20 MG tablet TAKE 1 TABLET BY MOUTH EVERY DAY 90 tablet 0   valACYclovir (VALTREX) 500 MG tablet Take 500 mg by mouth 2 (two) times daily as needed (fever blisters).  1   No current facility-administered medications for this visit.    PHYSICAL EXAMINATION: ECOG PERFORMANCE STATUS: 1 - Symptomatic but completely ambulatory  BP (!) 90/50 (BP  Location: Right Arm, Patient Position: Sitting, Cuff Size: Normal)   Pulse (!) 101   Temp (!) 97.2 F (36.2 C) (Tympanic)   Ht 5' 3"  (1.6 m)   Wt 150 lb 12.8 oz (68.4 kg)   LMP  (LMP Unknown)   SpO2 99%   BMI 26.71 kg/m   Filed Weights   10/17/21 1443  Weight: 150 lb 12.8 oz (68.4 kg)    Physical Exam Constitutional:      Comments: Alone.  Ambulating independently.  HENT:     Head: Normocephalic and atraumatic.     Mouth/Throat:     Pharynx: No oropharyngeal exudate.  Eyes:     Pupils: Pupils are equal, round, and reactive to light.  Cardiovascular:     Rate and Rhythm: Tachycardia present. Rhythm irregular.  Pulmonary:     Effort: Pulmonary effort is normal. No respiratory distress.     Breath sounds: Normal breath sounds. No wheezing.  Abdominal:     General: Bowel sounds are normal. There is no distension.     Palpations: Abdomen is soft. There is no mass.     Tenderness: There is no abdominal tenderness. There is no guarding or rebound.     Comments: Positive for splenomegaly.  Musculoskeletal:        General: No tenderness. Normal range of motion.     Cervical back: Normal range of motion and neck supple.  Skin:    General: Skin is warm.  Neurological:     Mental Status: She is alert and oriented to person, place, and time.  Psychiatric:        Mood and Affect: Affect normal.      LABORATORY DATA:  I have reviewed the data as listed    Component Value Date/Time   NA 138 10/17/2021 1433   NA 137 04/26/2021 1439   K 3.9 10/17/2021 1433   CL 103 10/17/2021 1433   CO2 28 10/17/2021 1433   GLUCOSE 205 (H) 10/17/2021 1433   BUN 12 10/17/2021 1433   BUN 19 04/26/2021 1439  CREATININE 0.78 10/17/2021 1433   CALCIUM 8.8 (L) 10/17/2021 1433   PROT 7.1 10/17/2021 1433   PROT 7.1 11/30/2020 0944   ALBUMIN 3.7 10/17/2021 1433   ALBUMIN 3.8 11/30/2020 0944   AST 26 10/17/2021 1433   ALT 16 10/17/2021 1433   ALKPHOS 54 10/17/2021 1433   BILITOT 0.7  10/17/2021 1433   BILITOT 0.8 11/30/2020 0944   GFRNONAA >60 10/17/2021 1433   GFRAA 80 02/25/2020 1105    No results found for: "SPEP", "UPEP"  Lab Results  Component Value Date   WBC 1.9 (L) 10/17/2021   NEUTROABS 1.0 (L) 10/17/2021   HGB 10.8 (L) 10/17/2021   HCT 31.2 (L) 10/17/2021   MCV 90.4 10/17/2021   PLT 62 (L) 10/17/2021      Chemistry      Component Value Date/Time   NA 138 10/17/2021 1433   NA 137 04/26/2021 1439   K 3.9 10/17/2021 1433   CL 103 10/17/2021 1433   CO2 28 10/17/2021 1433   BUN 12 10/17/2021 1433   BUN 19 04/26/2021 1439   CREATININE 0.78 10/17/2021 1433      Component Value Date/Time   CALCIUM 8.8 (L) 10/17/2021 1433   ALKPHOS 54 10/17/2021 1433   AST 26 10/17/2021 1433   ALT 16 10/17/2021 1433   BILITOT 0.7 10/17/2021 1433   BILITOT 0.8 11/30/2020 0944       RADIOGRAPHIC STUDIES: I have personally reviewed the radiological images as listed and agreed with the findings in the report. No results found.   ASSESSMENT & PLAN:  Other pancytopenia ([place]) # Anemia-secondary GI bleed chronic/liver disease.  Hemoglobin-10-11.  Proceed with IV iron infusion.  # AUG 2023- intramucosal cystic mass [Dr.Wohl] s/p sigmoidoscopy [09/14/2021]- s/p palpable; status post aspiration of cystic mass with mucous/gelatinous appearance. No pus-benign etiology.  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  ANC-900; Platelets 60- 70s. STABLE.   # Tachy-brady syndrome- Hx of A.fib [awaiting evaluation with Dr.End]-defer to cardiology regarding management.  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 BIL Mammogram JAN 2023 [Dr.Cintron]-within normal limits.  Not on AI.  Stable.   # DISPOSITION:  # Venofer today # follow up in 4 month-  MD; labs- cbc/cmp;iron studies;ferritin-  possible Venofer-Dr.B    Orders Placed This Encounter  Procedures   CBC with Differential/Platelet    Standing Status:   Future    Standing Expiration Date:   10/18/2022   Comprehensive metabolic panel    Standing Status:   Future    Standing Expiration Date:   10/18/2022   Iron and TIBC    Standing Status:   Future    Standing Expiration Date:   10/18/2022   Ferritin    Standing Status:   Future    Standing Expiration Date:   10/18/2022   AFP tumor marker    Standing Status:   Future    Standing Expiration Date:   10/19/2022   All questions were answered. The patient knows to call the clinic with any problems, questions or concerns.      Cammie Sickle, MD 10/18/2021 7:39 AM

## 2021-10-18 ENCOUNTER — Encounter: Payer: Self-pay | Admitting: Internal Medicine

## 2021-11-15 ENCOUNTER — Encounter: Payer: Self-pay | Admitting: Podiatry

## 2021-11-15 ENCOUNTER — Ambulatory Visit (INDEPENDENT_AMBULATORY_CARE_PROVIDER_SITE_OTHER): Payer: Medicare PPO | Admitting: Podiatry

## 2021-11-15 DIAGNOSIS — E1142 Type 2 diabetes mellitus with diabetic polyneuropathy: Secondary | ICD-10-CM

## 2021-11-15 DIAGNOSIS — M79674 Pain in right toe(s): Secondary | ICD-10-CM

## 2021-11-15 DIAGNOSIS — M79675 Pain in left toe(s): Secondary | ICD-10-CM

## 2021-11-15 DIAGNOSIS — M2042 Other hammer toe(s) (acquired), left foot: Secondary | ICD-10-CM

## 2021-11-15 DIAGNOSIS — B351 Tinea unguium: Secondary | ICD-10-CM | POA: Diagnosis not present

## 2021-11-15 DIAGNOSIS — M2010 Hallux valgus (acquired), unspecified foot: Secondary | ICD-10-CM

## 2021-11-15 DIAGNOSIS — M2041 Other hammer toe(s) (acquired), right foot: Secondary | ICD-10-CM

## 2021-11-15 NOTE — Progress Notes (Signed)
This patient returns to my office for at risk foot care.  This patient requires this care by a professional since this patient will be at risk due to having diabetes  This patient is unable to cut nails himself since the patient cannot reach his nails.These nails are painful walking and wearing shoes.  This patient presents for at risk foot care today.    General Appearance  Alert, conversant and in no acute stress.  Vascular  Dorsalis pedis and posterior tibial  pulses are palpable  bilaterally.  Capillary return is within normal limits  bilaterally. Temperature is within normal limits  bilaterally.  Neurologic  Senn-Weinstein monofilament test dimininished  bilaterally. Muscle power within normal limits bilaterally.  Nails Thick disfigured discolored nails with subungual debris  from hallux to fifth toes bilaterally. No evidence of bacterial infection or drainage bilaterally.  Orthopedic  No limitations of motion  feet .  No crepitus or effusions noted.  No bony pathology or digital deformities noted. HAV  B/L.  Hammer toes  B/L.  Midfoot DJD  B/L.  Skin  normotropic skin with no porokeratosis noted bilaterally.  No signs of infections or ulcers noted.     Onychomycosis  Pain in right toes  Pain in left toes  Consent was obtained for treatment procedures.   Mechanical debridement of nails 1-5  bilaterally performed with a nail nipper.  Filed with dremel without incident.     Return office visit    3 months                Told patient to return for periodic foot care and evaluation due to potential at risk complications.   Gardiner Barefoot DPM

## 2021-12-21 ENCOUNTER — Ambulatory Visit (INDEPENDENT_AMBULATORY_CARE_PROVIDER_SITE_OTHER): Payer: Medicare PPO

## 2021-12-21 DIAGNOSIS — I495 Sick sinus syndrome: Secondary | ICD-10-CM | POA: Diagnosis not present

## 2021-12-22 LAB — CUP PACEART REMOTE DEVICE CHECK
Battery Remaining Percentage: 90 %
Brady Statistic RA Percent Paced: 95 %
Brady Statistic RV Percent Paced: 99 %
Date Time Interrogation Session: 20231110080153
Implantable Lead Connection Status: 753985
Implantable Lead Connection Status: 753985
Implantable Lead Implant Date: 20230210
Implantable Lead Implant Date: 20230210
Implantable Lead Location: 753859
Implantable Lead Location: 753860
Implantable Lead Model: 377169
Implantable Lead Model: 377171
Implantable Lead Serial Number: 7000400966
Implantable Lead Serial Number: 7000402075
Implantable Pulse Generator Implant Date: 20230210
Lead Channel Impedance Value: 468 Ohm
Lead Channel Impedance Value: 585 Ohm
Lead Channel Pacing Threshold Amplitude: 0.7 V
Lead Channel Pacing Threshold Amplitude: 1.2 V
Lead Channel Pacing Threshold Pulse Width: 0.4 ms
Lead Channel Pacing Threshold Pulse Width: 0.4 ms
Lead Channel Sensing Intrinsic Amplitude: 1.5 mV
Lead Channel Sensing Intrinsic Amplitude: 12.7 mV
Lead Channel Setting Pacing Amplitude: 3 V
Lead Channel Setting Pacing Amplitude: 3 V
Lead Channel Setting Pacing Pulse Width: 0.4 ms
Pulse Gen Model: 407145
Pulse Gen Serial Number: 70331369

## 2021-12-26 NOTE — Progress Notes (Signed)
Remote pacemaker transmission.   

## 2022-01-14 ENCOUNTER — Other Ambulatory Visit: Payer: Self-pay | Admitting: General Surgery

## 2022-01-14 DIAGNOSIS — Z1231 Encounter for screening mammogram for malignant neoplasm of breast: Secondary | ICD-10-CM

## 2022-02-05 ENCOUNTER — Ambulatory Visit
Admission: RE | Admit: 2022-02-05 | Discharge: 2022-02-05 | Disposition: A | Discharge status: Home / Self Care | Payer: Medicare PPO | Source: Ambulatory Visit | Attending: General Surgery | Admitting: General Surgery

## 2022-02-05 DIAGNOSIS — Z1231 Encounter for screening mammogram for malignant neoplasm of breast: Secondary | ICD-10-CM | POA: Insufficient documentation

## 2022-02-14 ENCOUNTER — Ambulatory Visit: Payer: Medicare PPO | Admitting: Podiatry

## 2022-02-14 IMAGING — MG DIGITAL DIAGNOSTIC BILAT W/ TOMO W/ CAD
6 series · 6 of 14 positions shown · non-contrast
Comparison: Previous exam(s).

CLINICAL DATA: History of a left lumpectomy for breast carcinoma
the lumpectomy bed. Patient was treated with adjuvant radiation
therapy.

EXAM:
DIGITAL DIAGNOSTIC BILATERAL MAMMOGRAM WITH TOMO AND CAD

[R MLO synth-2D]
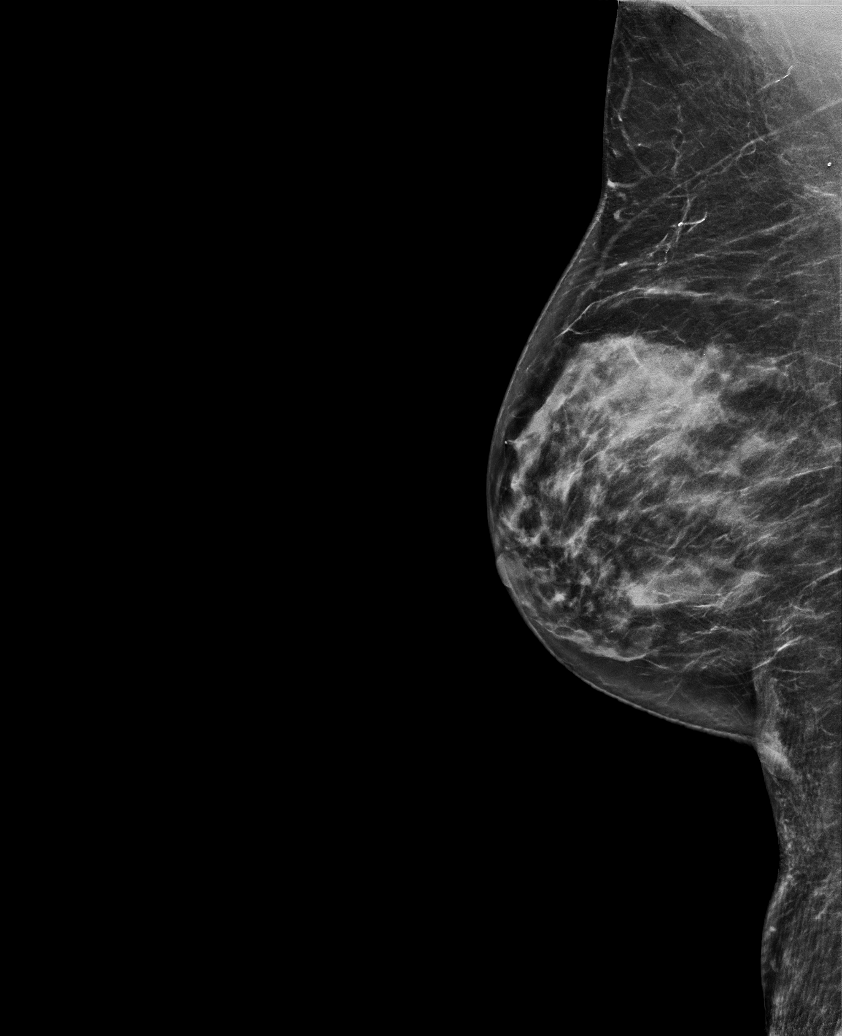

[R CC synth-2D]
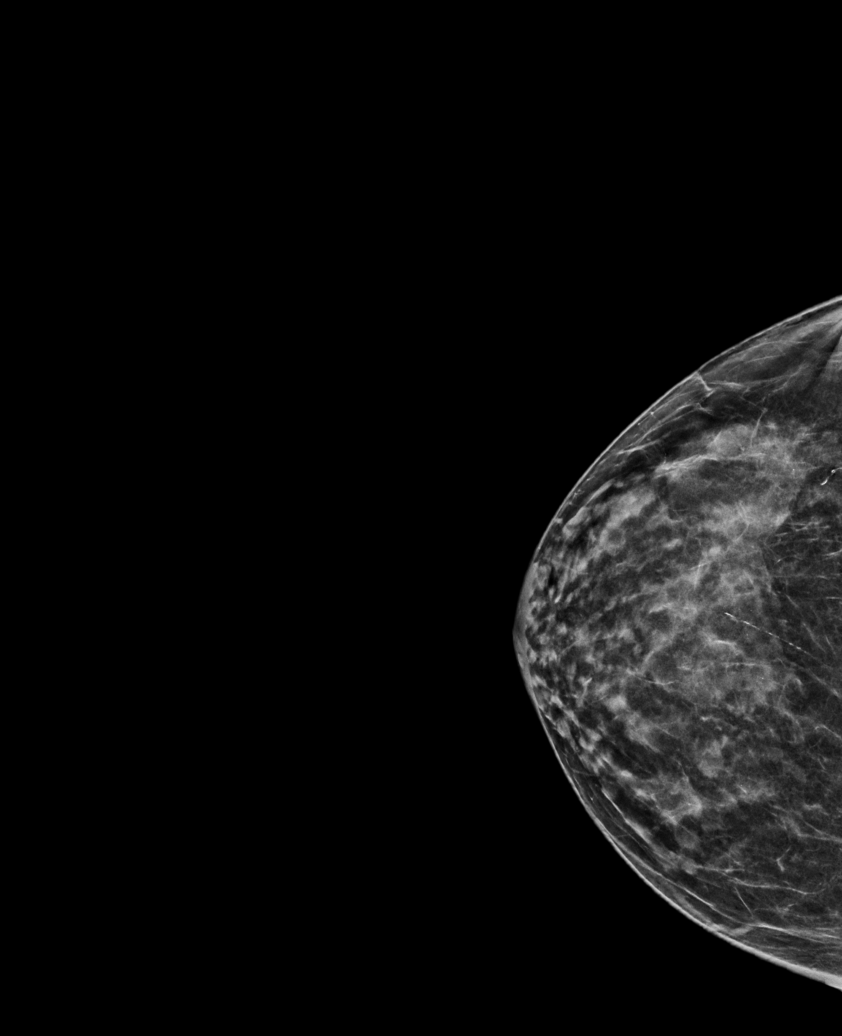

[L CC synth-2D]
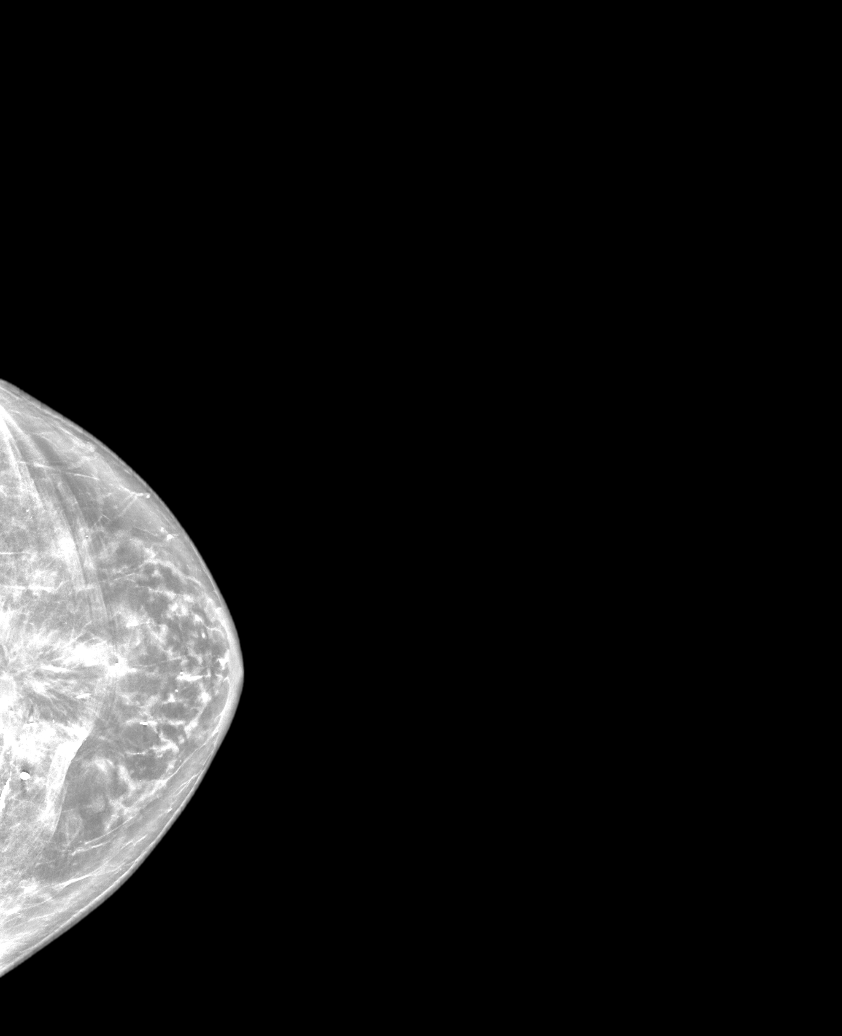

[L MLO synth-2D]
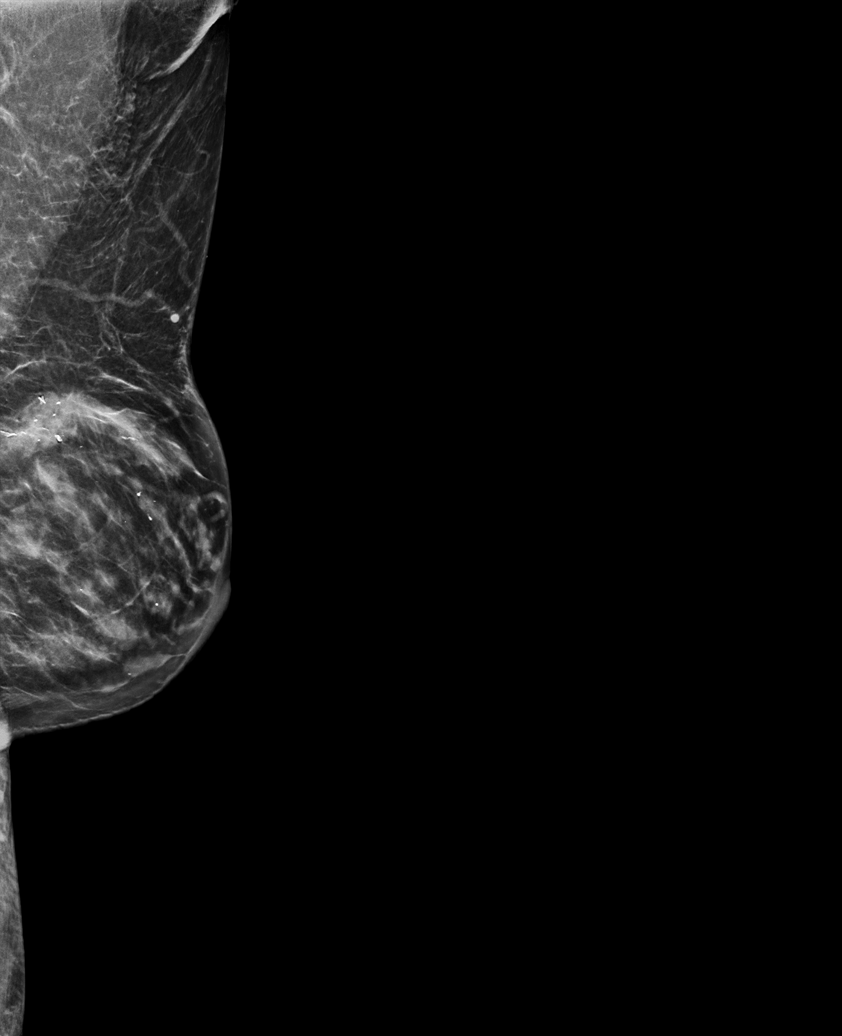

[R CC tomo · tomo slice 25/48.0]
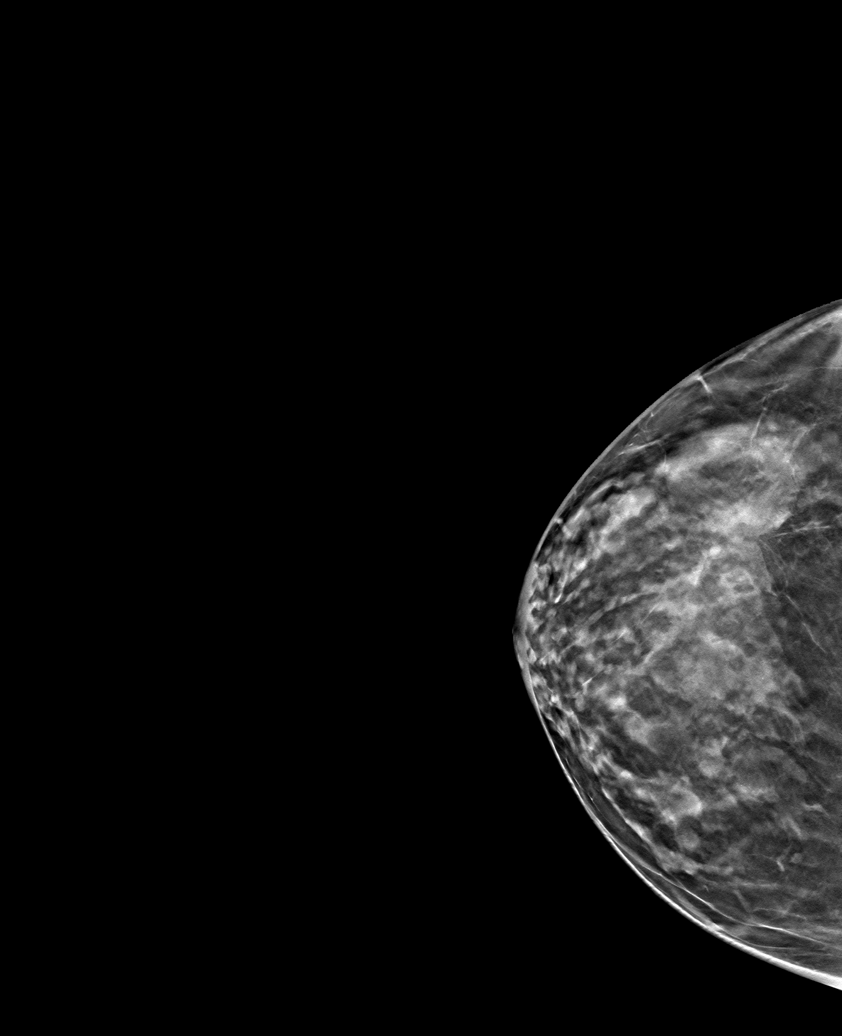

[L CC tomo · tomo slice 33/66.0]
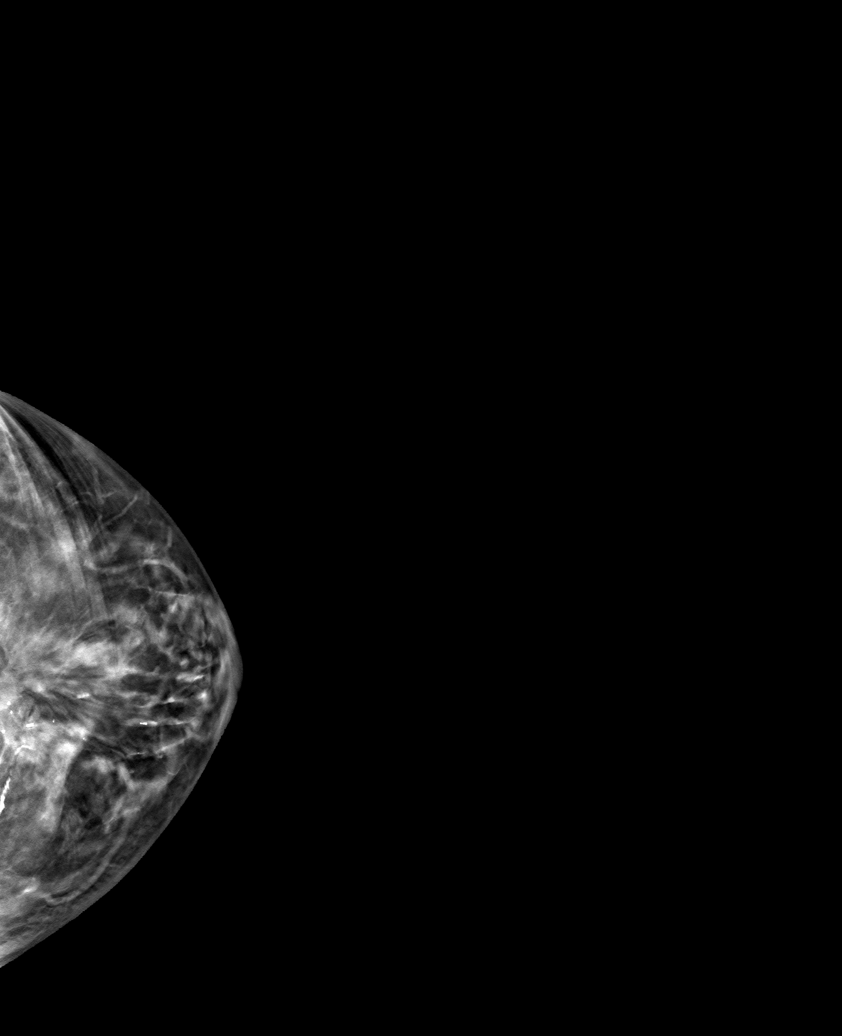

[6 of 14 positions shown; findings below may reference images not displayed]

ACR Breast Density Category c: The breast tissue is heterogeneously
dense, which may obscure small masses.
FINDINGS: Post lumpectomy changes on the left are stable from the most recent
prior exam.

There are no masses or areas of nonsurgical architectural
distortion. There are no suspicious calcifications.

Mammographic images were processed with CAD.
IMPRESSION: 1. No evidence of new or recurrent breast carcinoma.
2. Benign post lumpectomy changes on the left.

RECOMMENDATION:
Diagnostic mammogram in 1 year per standard post lumpectomy
protocol. (Code:TD-B-3Q9)

I have discussed the findings and recommendations with the patient.
If applicable, a reminder letter will be sent to the patient
regarding the next appointment.

BI-RADS CATEGORY  2: Benign.

## 2022-02-15 ENCOUNTER — Inpatient Hospital Stay: Payer: Medicare PPO

## 2022-02-15 ENCOUNTER — Encounter: Payer: Self-pay | Admitting: Internal Medicine

## 2022-02-15 ENCOUNTER — Inpatient Hospital Stay: Payer: Medicare PPO | Attending: Internal Medicine | Admitting: Internal Medicine

## 2022-02-15 VITALS — BP 100/55 | HR 70 | Temp 96.9°F | Resp 19 | Wt 150.7 lb

## 2022-02-15 DIAGNOSIS — E611 Iron deficiency: Secondary | ICD-10-CM

## 2022-02-15 DIAGNOSIS — K7460 Unspecified cirrhosis of liver: Secondary | ICD-10-CM | POA: Insufficient documentation

## 2022-02-15 DIAGNOSIS — K766 Portal hypertension: Secondary | ICD-10-CM | POA: Insufficient documentation

## 2022-02-15 DIAGNOSIS — D6959 Other secondary thrombocytopenia: Secondary | ICD-10-CM | POA: Diagnosis not present

## 2022-02-15 DIAGNOSIS — D61818 Other pancytopenia: Secondary | ICD-10-CM | POA: Insufficient documentation

## 2022-02-15 DIAGNOSIS — N920 Excessive and frequent menstruation with regular cycle: Secondary | ICD-10-CM | POA: Insufficient documentation

## 2022-02-15 DIAGNOSIS — R161 Splenomegaly, not elsewhere classified: Secondary | ICD-10-CM

## 2022-02-15 DIAGNOSIS — D500 Iron deficiency anemia secondary to blood loss (chronic): Secondary | ICD-10-CM | POA: Diagnosis present

## 2022-02-15 DIAGNOSIS — Z86000 Personal history of in-situ neoplasm of breast: Secondary | ICD-10-CM | POA: Diagnosis not present

## 2022-02-15 DIAGNOSIS — I4891 Unspecified atrial fibrillation: Secondary | ICD-10-CM | POA: Diagnosis not present

## 2022-02-15 LAB — CBC WITH DIFFERENTIAL/PLATELET
Abs Immature Granulocytes: 0.01 10*3/uL (ref 0.00–0.07)
Basophils Absolute: 0 10*3/uL (ref 0.0–0.1)
Basophils Relative: 1 %
Eosinophils Absolute: 0.1 10*3/uL (ref 0.0–0.5)
Eosinophils Relative: 3 %
HCT: 30.5 % — ABNORMAL LOW (ref 36.0–46.0)
Hemoglobin: 10.4 g/dL — ABNORMAL LOW (ref 12.0–15.0)
Immature Granulocytes: 1 %
Lymphocytes Relative: 34 %
Lymphs Abs: 0.6 10*3/uL — ABNORMAL LOW (ref 0.7–4.0)
MCH: 31.1 pg (ref 26.0–34.0)
MCHC: 34.1 g/dL (ref 30.0–36.0)
MCV: 91.3 fL (ref 80.0–100.0)
Monocytes Absolute: 0.2 10*3/uL (ref 0.1–1.0)
Monocytes Relative: 9 %
Neutro Abs: 1 10*3/uL — ABNORMAL LOW (ref 1.7–7.7)
Neutrophils Relative %: 52 %
Platelets: 58 10*3/uL — ABNORMAL LOW (ref 150–400)
RBC: 3.34 MIL/uL — ABNORMAL LOW (ref 3.87–5.11)
RDW: 13.6 % (ref 11.5–15.5)
WBC: 1.8 10*3/uL — ABNORMAL LOW (ref 4.0–10.5)
nRBC: 0 % (ref 0.0–0.2)

## 2022-02-15 LAB — COMPREHENSIVE METABOLIC PANEL
ALT: 17 U/L (ref 0–44)
AST: 27 U/L (ref 15–41)
Albumin: 3.6 g/dL (ref 3.5–5.0)
Alkaline Phosphatase: 62 U/L (ref 38–126)
Anion gap: 9 (ref 5–15)
BUN: 17 mg/dL (ref 8–23)
CO2: 28 mmol/L (ref 22–32)
Calcium: 8.5 mg/dL — ABNORMAL LOW (ref 8.9–10.3)
Chloride: 99 mmol/L (ref 98–111)
Creatinine, Ser: 0.89 mg/dL (ref 0.44–1.00)
GFR, Estimated: >60 mL/min (ref >60)
Glucose, Bld: 273 mg/dL — ABNORMAL HIGH (ref 70–99)
Potassium: 3.7 mmol/L (ref 3.5–5.1)
Sodium: 136 mmol/L (ref 135–145)
Total Bilirubin: 0.7 mg/dL (ref 0.3–1.2)
Total Protein: 7.4 g/dL (ref 6.5–8.1)

## 2022-02-15 LAB — IRON AND TIBC
Iron: 80 ug/dL (ref 28–170)
Saturation Ratios: 23 % (ref 10.4–31.8)
TIBC: 351 ug/dL (ref 250–450)
UIBC: 271 ug/dL

## 2022-02-15 LAB — FERRITIN: Ferritin: 166 ng/mL (ref 11–307)

## 2022-02-15 MED ORDER — SODIUM CHLORIDE 0.9 % IV SOLN
200.0000 mg | Freq: Once | INTRAVENOUS | Status: AC
  Administered 2022-02-15: 200 mg via INTRAVENOUS
  Filled 2022-02-15: qty 200

## 2022-02-15 MED ORDER — SODIUM CHLORIDE 0.9 % IV SOLN
Freq: Once | INTRAVENOUS | Status: AC
  Filled 2022-02-15: qty 250

## 2022-02-15 NOTE — Progress Notes (Signed)
[place] OFFICE PROGRESS NOTE  Patient Care Team: Albina Billet, MD as PCP - General (Internal Medicine) End, Harrell Gave, MD as PCP - Cardiology (Cardiology) Vickie Epley, MD as PCP - Electrophysiology (Cardiology) Cammie Sickle, MD as Consulting Physician (Internal Medicine)   Cancer Staging  No matching staging information was found for the patient.   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 OCT 2018- BMBx- MILD dyspoiesis; variable cellularity [10-50%]; FISH/Cytogenetics-NORMAL; F-One-NGS-declined by insurance.    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   #Frequent UTIs [2019-2020; Dr. Brandon/uro-gyne,[facility]]; Dr.Wohl/  # HRT [clinical trial thru [facility]; stopped July 2017 ]; CPAP   DIAGNOSIS: Left breast DCIS  STAGE:    0     ;GOALS: cure  CURRENT/MOST RECENT THERAPY: surveillaince    Ductal carcinoma in situ (DCIS) of left breast    INTERVAL HISTORY: Alone.  Ambulating independently.  Annette Foster 82 y.o.  female pleasant patient above history of DCIS; cirrhosis portal hypertension; A. fib and pancytopenia is here for follow-up.  Patient has no concerns. No bleeding. Chronic mild fatigue.   Denies any swelling in the legs.  Denies any shortness of breath or cough.    Review of Systems  Constitutional:  Positive for malaise/fatigue. Negative for chills, diaphoresis, fever and weight loss.  HENT:  Negative for nosebleeds and sore throat.   Eyes:   Negative for double vision.  Respiratory:  Negative for cough, hemoptysis, sputum production, shortness of breath and wheezing.   Cardiovascular:  Negative for chest pain, palpitations, orthopnea and leg swelling.  Gastrointestinal:  Negative for abdominal pain, blood in stool, constipation, diarrhea, heartburn, melena, nausea and vomiting.  Musculoskeletal:  Positive for back pain and joint pain.  Skin: Negative.  Negative for itching and rash.  Neurological:  Negative for dizziness, tingling, focal weakness, weakness and headaches.  Endo/Heme/Allergies:  Does not bruise/bleed easily.  Psychiatric/Behavioral:  Negative for depression. The patient is not nervous/anxious and does not have insomnia.       PAST MEDICAL HISTORY :  Past Medical History:  Diagnosis Date   Abdominal pain    Allergy    Cancer ([place]) 2017   breast cancer- Left   Cataract    CHF (congestive heart failure) (HCC)    Collagenous colitis    Diabetes mellitus without complication (HCC)    Diarrhea    Diverticulosis    Dysrhythmia    Fatty liver disease, nonalcoholic    Fibrocystic breast    GERD (gastroesophageal reflux disease)    Heart murmur    Hyperlipidemia    Hypertension    Hypothyroidism    IBS (irritable bowel syndrome)    IDA (iron deficiency anemia)    Personal history of radiation therapy 2017   LEFT BREAST CA   PONV (postoperative nausea and vomiting)    Presence of permanent cardiac pacemaker    Sleep apnea    C-Pap    PAST SURGICAL HISTORY :   Past Surgical History:  Procedure Laterality Date   ABDOMINAL HYSTERECTOMY     [place] bso   ABDOMINAL SURGERY  APPENDECTOMY     AV NODE ABLATION N/A 05/17/2021   Procedure: AV NODE ABLATION;  Surgeon: Vickie Epley, MD;  Location: [place] [facility];  Service: Cardiovascular;  Laterality: N/A;   BREAST BIOPSY Bilateral 2016   negative   BREAST 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/2015   DCIS, papillary carcinoma in situ   BREAST BIOPSY Left 05/27/2016    BENIGN MAMMARY EPITHELIUM   BREAST BIOPSY Left 11/20/2017   affirm bx x clip BENIGN MAMMARY EPITHELIUM CONSISTENT WITH RAD THERAPY   BREAST EXCISIONAL BIOPSY Right    NEG 1980's   BREAST LUMPECTOMY Left 10/17/2015   DCIS and papillary carcinoma insitu, clear margins   CARDIAC SURGERY     has replacement valve   CATARACT EXTRACTION Right    CATARACT EXTRACTION W/PHACO Left 01/16/2021   Procedure: CATARACT EXTRACTION PHACO AND INTRAOCULAR LENS PLACEMENT (IOC) LEFT DIABETIC 8.46 00:59.8;  Surgeon: Birder Robson, MD;  Location: [place];  Service: Ophthalmology;  Laterality: Left;   CHOLECYSTECTOMY     COLONOSCOPY WITH PROPOFOL N/A 03/11/2016   Procedure: COLONOSCOPY WITH PROPOFOL;  Surgeon: Manya Silvas, MD;  Location: [facility] ENDOSCOPY;  Service: Endoscopy;  Laterality: N/A;   COLONOSCOPY WITH PROPOFOL N/A 02/18/2020   Procedure: COLONOSCOPY WITH PROPOFOL;  Surgeon: [name], MD;  Location: [facility] ENDOSCOPY;  Service: Endoscopy;  Laterality: N/A;   ESOPHAGOGASTRODUODENOSCOPY (EGD) WITH PROPOFOL N/A 03/11/2016   Procedure: ESOPHAGOGASTRODUODENOSCOPY (EGD) WITH PROPOFOL;  Surgeon: Manya Silvas, MD;  Location[facility] ENDOSCOPY;  Service: Endoscopy;  Laterality: N/A;   ESOPHAGOGASTRODUODENOSCOPY (EGD) WITH PROPOFOL N/A 02/18/2020   Procedure: ESOPHAGOGASTRODUODENOSCOPY (EGD) WITH PROPOFOL;  Surgeon: [name], MD;  Location: [facility] ENDOSCOPY;  Service: Endoscopy;  Laterality: N/A;   ESOPHAGOGASTRODUODENOSCOPY (EGD) WITH PROPOFOL N/A 04/25/2020   Procedure: ESOPHAGOGASTRODUODENOSCOPY (EGD) WITH PROPOFOL;  Surgeon: [name], MD;  Location: [facility] ENDOSCOPY;  Service: Endoscopy;  Laterality: N/A;   ESOPHAGOGASTRODUODENOSCOPY (EGD) WITH PROPOFOL N/A 05/23/2020   Procedure: ESOPHAGOGASTRODUODENOSCOPY (EGD) WITH PROPOFOL;  Surgeon: [name], MD;  Location: [facility] ENDOSCOPY;  Service: Endoscopy;  Laterality: N/A;   EUS N/A 10/04/2021   Procedure: LOWER ENDOSCOPIC ULTRASOUND  (EUS);  Surgeon: Reita Cliche, MD;  Location: [facility] ENDOSCOPY;  Service: Gastroenterology;  Laterality: N/A;  [facility]   FINGER ARTHROSCOPY WITH CARPOMETACARPEL ([place]) ARTHROPLASTY Right 09/03/2018   Procedure: CARPOMETACARPEL [facility]) ARTHROPLASTY RIGHT THUMB;  Surgeon: Hessie Knows, MD;  Location: [facility] ORS;  Service: Orthopedics;  Laterality: Right;   FLEXIBLE SIGMOIDOSCOPY N/A 09/14/2021   Procedure: FLEXIBLE SIGMOIDOSCOPY;  Surgeon: [name], MD;  Location: [facility] ENDOSCOPY;  Service: Endoscopy;  Laterality: N/A;  No anesthesia   GANGLION CYST EXCISION Right 09/03/2018   Procedure: REMOVAL GANGLION OF WRIST;  Surgeon: Hessie Knows, MD;  Location: [facility] ORS;  Service: Orthopedics;  Laterality: Right;   HARDWARE REMOVAL Right 09/03/2018   Procedure: HARDWARE REMOVAL RIGHT THUMB;  Surgeon: Hessie Knows, MD;  Location: [facility] ORS;  Service: Orthopedics;  Laterality: Right;  staple removed   JOINT REPLACEMENT Left    TKR   left sinusplasty      MASTECTOMY, PARTIAL Left 10/17/2015   Procedure: MASTECTOMY PARTIAL REVISION;  Surgeon: Leonie Green, MD;  Location: [place] [facility];  Service: General;  Laterality: Left;   PACEMAKER IMPLANT N/A 03/23/2021   Procedure: PACEMAKER IMPLANT;  Surgeon: Vickie Epley, MD;  Location: [place] [facility];  Service: Cardiovascular;  Laterality: N/A;   PARTIAL MASTECTOMY WITH NEEDLE LOCALIZATION Left 09/29/2015   Procedure: PARTIAL MASTECTOMY WITH NEEDLE LOCALIZATION;  Surgeon: Leonie Green,  MD;  Location: [facility] OR[facility];  Service: General;  Laterality: Left;   RECTAL EXAM UNDER ANESTHESIA N/A 10/05/2021   Procedure: RECTAL EXAM UNDER ANESTHESIA, ASPIRATION OF RECTAL CYST;  Surgeon: Herbert Pun, MD;  Location: [facility] OR[facility];  Service: General;  Laterality: N/A;   TOTAL ABDOMINAL HYSTERECTOMY W/ BILATERAL SALPINGOOPHORECTOMY      FAMILY HISTORY :   Family History  Problem Relation Age of Onset   Breast cancer Paternal Grandmother    Colon cancer Father     Diabetes Sister    Diabetes Brother    Heart disease Brother    Prostate cancer Brother    Colon cancer Maternal Uncle    Prostate cancer Brother    Bladder Cancer Brother    Leukemia Mother        all   Ovarian cancer Neg Hx    Kidney cancer Neg Hx     SOCIAL HISTORY:   Social History   Tobacco Use   Smoking status: Never   Smokeless tobacco: Never  Vaping Use   Vaping Use: Never used  Substance Use Topics   Alcohol use: No    Alcohol/week: 0.0 standard drinks of alcohol   Drug use: No    ALLERGIES:  is allergic to codeine, demeclocycline, demerol [meperidine], hydrocodone, oxycodone, pentazocine, tetracyclines & related, and fentanyl.  MEDICATIONS:  Current Outpatient Medications  Medication Sig Dispense Refill   acetaminophen (TYLENOL) 325 MG tablet Take 1-2 tablets (325-650 mg total) by mouth every 4 (four) hours as needed for mild pain.     Cholecalciferol (VITAMIN D) 50 MCG (2000 UT) CAPS Take 2,000 Units by mouth daily.     ciprofloxacin (CIPRO) 500 MG tablet Take 500 mg by mouth 2 (two) times daily.     clobetasol ointment (TEMOVATE) 6.83 % Apply 1 application topically as needed (vaginal irritation).     clotrimazole (LOTRIMIN) 1 % cream Apply 1 Application topically 2 (two) times daily as needed.     cyanocobalamin (,VITAMIN B-12,) 1000 MCG/ML injection 1,000 mcg every 30 (thirty) days.     dicyclomine (BENTYL) 10 MG capsule Take 10 mg by mouth in the morning, at noon, and at bedtime.     docusate sodium (COLACE) 100 MG capsule Take 100 mg by mouth 3 (three) times daily as needed for moderate constipation.     EPINEPHrine 0.3 mg/0.3 mL IJ SOAJ injection Inject 0.3 mg into the muscle as needed for anaphylaxis.     esomeprazole (NEXIUM) 40 MG capsule Take 40 mg by mouth 2 (two) times daily before a meal.      estradiol (ESTRACE) 0.1 MG/GM vaginal cream Place 1 Applicatorful vaginally daily as needed (irritation).     fenofibrate 160 MG tablet Take 160 mg by mouth  at bedtime.     gabapentin (NEURONTIN) 300 MG capsule Take 300 mg by mouth at bedtime.      hydrOXYzine (ATARAX/VISTARIL) 25 MG tablet Take 25 mg by mouth every 6 (six) hours as needed for anxiety, itching, nausea or vomiting.     Insulin Degludec (TRESIBA) 100 UNIT/ML SOLN Inject 174 Units into the skin at bedtime.     levocetirizine (XYZAL) 5 MG tablet Take 5 mg by mouth every evening.     levothyroxine (SYNTHROID, LEVOTHROID) 75 MCG tablet Take 75 mcg by mouth daily before breakfast.      magnesium oxide (MAG-OX) 400 MG tablet Take 400 mg by mouth 2 (two) times daily.      nadolol (CORGARD) 20 MG tablet Take 0.5  tablets (10 mg total) by mouth daily. (Patient taking differently: Take 20 mg by mouth daily.) 45 tablet 0   NONFORMULARY OR COMPOUNDED ITEM Nifedipine 0.3% plus lidocaine 2% cream apply to rectum BID and after every BM.     NOVOLOG FLEXPEN 100 UNIT/ML FlexPen Inject 48-68 Units into the skin 3 (three) times daily with meals. Based on sliding scale     potassium chloride SA (K-DUR,KLOR-CON) 20 MEQ tablet Take 20 mEq by mouth 2 (two) times daily.      saccharomyces boulardii (FLORASTOR) 250 MG capsule Take 250 mg by mouth 2 (two) times daily.      simvastatin (ZOCOR) 20 MG tablet Take 20 mg by mouth at bedtime.     spironolactone (ALDACTONE) 25 MG tablet TAKE 1/2 TABLET BY MOUTH EVERY DAY 45 tablet 1   tirzepatide (MOUNJARO) 12.5 MG/0.5ML Pen Inject 12.5 mg into the skin once a week.     torsemide (DEMADEX) 20 MG tablet TAKE 1 TABLET BY MOUTH EVERY DAY 90 tablet 0   valACYclovir (VALTREX) 500 MG tablet Take 500 mg by mouth 2 (two) times daily as needed (fever blisters).  1   No current facility-administered medications for this visit.   Facility-Administered Medications Ordered in Other Visits  Medication Dose Route Frequency Provider Last Rate Last Admin   iron sucrose (VENOFER) 200 mg in sodium chloride 0.9 % 100 mL IVPB  200 mg Intravenous Once Lloyd Huger, MD 440 mL/hr at  02/15/22 1424 200 mg at 02/15/22 1424    PHYSICAL EXAMINATION: ECOG PERFORMANCE STATUS: 1 - Symptomatic but completely ambulatory  BP (!) 100/55   Pulse 70   Temp (!) 96.9 F (36.1 C)   Resp 19   Wt 150 lb 11.2 oz (68.4 kg)   LMP  (LMP Unknown)   SpO2 99%   BMI 26.70 kg/m   Filed Weights   02/15/22 1336  Weight: 150 lb 11.2 oz (68.4 kg)    Physical Exam Constitutional:      Comments: Alone.  Ambulating independently.  HENT:     Head: Normocephalic and atraumatic.     Mouth/Throat:     Pharynx: No oropharyngeal exudate.  Eyes:     Pupils: Pupils are equal, round, and reactive to light.  Cardiovascular:     Rate and Rhythm: Tachycardia present. Rhythm irregular.  Pulmonary:     Effort: Pulmonary effort is normal. No respiratory distress.     Breath sounds: Normal breath sounds. No wheezing.  Abdominal:     General: Bowel sounds are normal. There is no distension.     Palpations: Abdomen is soft. There is no mass.     Tenderness: There is no abdominal tenderness. There is no guarding or rebound.     Comments: Positive for splenomegaly.  Musculoskeletal:        General: No tenderness. Normal range of motion.     Cervical back: Normal range of motion and neck supple.  Skin:    General: Skin is warm.  Neurological:     Mental Status: She is alert and oriented to person, place, and time.  Psychiatric:        Mood and Affect: Affect normal.      LABORATORY DATA:  I have reviewed the data as listed    Component Value Date/Time   NA 136 02/15/2022 1306   NA 137 04/26/2021 1439   K 3.7 02/15/2022 1306   CL 99 02/15/2022 1306   CO2 28 02/15/2022 1306   GLUCOSE  273 (H) 02/15/2022 1306   BUN 17 02/15/2022 1306   BUN 19 04/26/2021 1439   CREATININE 0.89 02/15/2022 1306   CALCIUM 8.5 (L) 02/15/2022 1306   PROT 7.4 02/15/2022 1306   PROT 7.1 11/30/2020 0944   ALBUMIN 3.6 02/15/2022 1306   ALBUMIN 3.8 11/30/2020 0944   AST 27 02/15/2022 1306   ALT 17 02/15/2022  1306   ALKPHOS 62 02/15/2022 1306   BILITOT 0.7 02/15/2022 1306   BILITOT 0.8 11/30/2020 0944   GFRNONAA >60 02/15/2022 1306   GFRAA 80 02/25/2020 1105    No results found for: "SPEP", "UPEP"  Lab Results  Component Value Date   WBC 1.8 (L) 02/15/2022   NEUTROABS 1.0 (L) 02/15/2022   HGB 10.4 (L) 02/15/2022   HCT 30.5 (L) 02/15/2022   MCV 91.3 02/15/2022   PLT 58 (L) 02/15/2022      Chemistry      Component Value Date/Time   NA 136 02/15/2022 1306   NA 137 04/26/2021 1439   K 3.7 02/15/2022 1306   CL 99 02/15/2022 1306   CO2 28 02/15/2022 1306   BUN 17 02/15/2022 1306   BUN 19 04/26/2021 1439   CREATININE 0.89 02/15/2022 1306      Component Value Date/Time   CALCIUM 8.5 (L) 02/15/2022 1306   ALKPHOS 62 02/15/2022 1306   AST 27 02/15/2022 1306   ALT 17 02/15/2022 1306   BILITOT 0.7 02/15/2022 1306   BILITOT 0.8 11/30/2020 0944       RADIOGRAPHIC STUDIES: I have personally reviewed the radiological images as listed and agreed with the findings in the report. No results found.   ASSESSMENT & PLAN:  Other pancytopenia ([place]) # Anemia-secondary GI bleed chronic/liver disease.  Hemoglobin-10-11.  Iron studies/ferritin -pending. STABLE- Proceed with IV iron infusion.   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  ANC-1000; Platelets 50-60s STABLE.   # Tachy-brady syndrome- Hx of A.fib [awaiting evaluation with Dr.End]-defer to cardiology regarding management.  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 BIL Mammogram JAN 2023 [Dr.Cintron]-within normal limits.  Not on AI.  Stable.   # DISPOSITION:  # US abdomen in 1-2 weeks.  # Venofer today # follow up in 4 month- MD; labs- cbc/cmp;iron studies;ferritin; AFP-  possible  Venofer-Dr.B     Orders Placed This Encounter  Procedures   US Abdomen Complete    Standing Status:   Future    Standing Expiration Date:   02/16/2023    Order Specific Question:   Reason for Exam (SYMPTOM  OR DIAGNOSIS REQUIRED)    Answer:   cirrhosis/splenomegaly    Order Specific Question:   Preferred imaging location?    Answer:   [facility]   AFP tumor marker    Standing Status:   Future    Standing Expiration Date:   02/16/2023   CBC with Differential/Platelet    Standing Status:   Future    Standing Expiration Date:   02/16/2023   Comprehensive metabolic panel    Standing Status:   Future    Standing Expiration Date:   02/16/2023   Iron and TIBC    Standing Status:   Future    Standing Expiration Date:   02/16/2023   Ferritin    Standing Status:   Future    Standing Expiration Date:   02/16/2023   All questions  were answered. The patient knows to call the clinic with any problems, questions or concerns.      Cammie Sickle, MD 02/15/2022 2:36 PM

## 2022-02-15 NOTE — Progress Notes (Signed)
Patient has no concerns 

## 2022-02-15 NOTE — Assessment & Plan Note (Addendum)
#   Anemia-secondary GI bleed chronic/liver disease.  Hemoglobin-10-11.  Iron studies/ferritin -pending. STABLE- Proceed with IV iron infusion.   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  ANC-1000; Platelets 50-60s STABLE.   # Tachy-brady syndrome- Hx of A.fib [Dr.En; no wtachnam deviced]- no anticoagulation.  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 screening-CT scan abdomen pelvis AUG 2023 negative [GI-Dr.Wohl]-STABLE; ultrasound is ordered today.    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 BIL Mammogram JAN 2023 [Dr.Cintron]-within normal limits.  Not on AI.  Stable.   # DISPOSITION:  # US abdomen in 1-2 weeks.  # Venofer today # follow up in 4 month- MD; labs- cbc/cmp;iron studies;ferritin; AFP-  possible Venofer-Dr.B

## 2022-02-15 NOTE — Patient Instructions (Signed)

## 2022-02-16 LAB — AFP TUMOR MARKER: AFP, Serum, Tumor Marker: 2.5 ng/mL (ref 0.0–8.7)

## 2022-02-18 ENCOUNTER — Ambulatory Visit (INDEPENDENT_AMBULATORY_CARE_PROVIDER_SITE_OTHER): Payer: Medicare PPO | Admitting: Gastroenterology

## 2022-02-18 ENCOUNTER — Encounter: Payer: Self-pay | Admitting: Gastroenterology

## 2022-02-18 VITALS — BP 104/51 | HR 72 | Temp 97.7°F | Wt 150.5 lb

## 2022-02-18 DIAGNOSIS — K7460 Unspecified cirrhosis of liver: Secondary | ICD-10-CM

## 2022-02-18 NOTE — Progress Notes (Signed)
Primary Care Physician: Albina Billet, MD  Primary Gastroenterologist:  Dr. [name]  Chief Complaint  Patient presents with   Follow-up    HPI: Annette Foster is a 82 y.o. female here for follow-up after her EUS.  The patient had a rectal submucosal lesion seen on her colonoscopy and was sent for imaging which appears to show a fluid collection and an EUS was ordered.  The patient's EUS showed a lesion that measured somewhere between 14 and 19 mm and was not biopsied due to its characteristic appearance of an inflammatory lesion. The patient reports that she is back to her normal loose bowel movements without any feeling that the thing in her rectum is obstructing.  She has no other issues at the present time and states that she is set up for an ultrasound by her hematologist due to her cirrhosis.  The patient's alpha-fetoprotein was not elevated.  Past Medical History:  Diagnosis Date   Abdominal pain    Allergy    Cancer ([place]) 2017   breast cancer- Left   Cataract    CHF (congestive heart failure) (HCC)    Collagenous colitis    Diabetes mellitus without complication (HCC)    Diarrhea    Diverticulosis    Dysrhythmia    Fatty liver disease, nonalcoholic    Fibrocystic breast    GERD (gastroesophageal reflux disease)    Heart murmur    Hyperlipidemia    Hypertension    Hypothyroidism    IBS (irritable bowel syndrome)    IDA (iron deficiency anemia)    Personal history of radiation therapy 2017   LEFT BREAST CA   PONV (postoperative nausea and vomiting)    Presence of permanent cardiac pacemaker    Sleep apnea    C-Pap    Current Outpatient Medications  Medication Sig Dispense Refill   acetaminophen (TYLENOL) 325 MG tablet Take 1-2 tablets (325-650 mg total) by mouth every 4 (four) hours as needed for mild pain.     Cholecalciferol (VITAMIN D) 50 MCG (2000 UT) CAPS Take 2,000 Units by mouth daily.     ciprofloxacin (CIPRO) 500 MG tablet Take 500 mg by  mouth 2 (two) times daily.     clobetasol ointment (TEMOVATE) 2.59 % Apply 1 application topically as needed (vaginal irritation).     clotrimazole (LOTRIMIN) 1 % cream Apply 1 Application topically 2 (two) times daily as needed.     cyanocobalamin (,VITAMIN B-12,) 1000 MCG/ML injection 1,000 mcg every 30 (thirty) days.     dicyclomine (BENTYL) 10 MG capsule Take 10 mg by mouth in the morning, at noon, and at bedtime.     docusate sodium (COLACE) 100 MG capsule Take 100 mg by mouth 3 (three) times daily as needed for moderate constipation.     EPINEPHrine 0.3 mg/0.3 mL IJ SOAJ injection Inject 0.3 mg into the muscle as needed for anaphylaxis.     esomeprazole (NEXIUM) 40 MG capsule Take 40 mg by mouth 2 (two) times daily before a meal.      estradiol (ESTRACE) 0.1 MG/GM vaginal cream Place 1 Applicatorful vaginally daily as needed (irritation).     fenofibrate 160 MG tablet Take 160 mg by mouth at bedtime.     gabapentin (NEURONTIN) 300 MG capsule Take 300 mg by mouth at bedtime.      hydrOXYzine (ATARAX/VISTARIL) 25 MG tablet Take 25 mg by mouth every 6 (six) hours as needed for anxiety, itching, nausea or vomiting.  Insulin Degludec (TRESIBA) 100 UNIT/ML SOLN Inject 184 Units into the skin at bedtime.     levocetirizine (XYZAL) 5 MG tablet Take 5 mg by mouth every evening.     levothyroxine (SYNTHROID, LEVOTHROID) 75 MCG tablet Take 75 mcg by mouth daily before breakfast.      magnesium oxide (MAG-OX) 400 MG tablet Take 400 mg by mouth 2 (two) times daily.      MOUNJARO 7.5 MG/0.5ML Pen Inject 7.5 mg into the skin once a week.     nadolol (CORGARD) 20 MG tablet Take 20 mg by mouth daily.     NONFORMULARY OR COMPOUNDED ITEM Nifedipine 0.3% plus lidocaine 2% cream apply to rectum BID and after every BM.     NOVOLOG FLEXPEN 100 UNIT/ML FlexPen Inject 48-68 Units into the skin 3 (three) times daily with meals. Based on sliding scale     potassium chloride SA (K-DUR,KLOR-CON) 20 MEQ tablet Take  20 mEq by mouth 2 (two) times daily.      saccharomyces boulardii (FLORASTOR) 250 MG capsule Take 250 mg by mouth 2 (two) times daily.      simvastatin (ZOCOR) 20 MG tablet Take 20 mg by mouth at bedtime.     spironolactone (ALDACTONE) 25 MG tablet TAKE 1/2 TABLET BY MOUTH EVERY DAY 45 tablet 1   torsemide (DEMADEX) 20 MG tablet TAKE 1 TABLET BY MOUTH EVERY DAY 90 tablet 0   valACYclovir (VALTREX) 500 MG tablet Take 500 mg by mouth 2 (two) times daily as needed (fever blisters).  1   No current facility-administered medications for this visit.    Allergies as of 02/18/2022 - Review Complete 02/18/2022  Allergen Reaction Noted   Codeine Itching and Nausea And Vomiting 06/28/2014   Demeclocycline Rash 04/10/2012   Demerol [meperidine] Itching and Nausea And Vomiting 06/28/2014   Hydrocodone Itching and Nausea And Vomiting 08/24/2018   Oxycodone Itching and Nausea And Vomiting 09/26/2015   Pentazocine Itching and Nausea And Vomiting 04/10/2012   Tetracyclines & related Rash 06/28/2014   Fentanyl Itching and Nausea And Vomiting 06/28/2014    ROS:  General: Negative for anorexia, weight loss, fever, chills, fatigue, weakness. ENT: Negative for hoarseness, difficulty swallowing , nasal congestion. CV: Negative for chest pain, angina, palpitations, dyspnea on exertion, peripheral edema.  Respiratory: Negative for dyspnea at rest, dyspnea on exertion, cough, sputum, wheezing.  GI: See history of present illness. GU:  Negative for dysuria, hematuria, urinary incontinence, urinary frequency, nocturnal urination.  Endo: Negative for unusual weight change.    Physical Examination:   BP (!) 104/51 (BP Location: Right Arm, Patient Position: Sitting, Cuff Size: Normal)   Pulse 72   Temp 97.7 F (36.5 C) (Oral)   Wt 150 lb 8 oz (68.3 kg)   LMP  (LMP Unknown)   BMI 26.66 kg/m   General: Well-nourished, well-developed in no acute distress.  Eyes: No icterus. Conjunctivae pink. Neuro: Alert  and oriented x 3.  Grossly intact. Skin: Warm and dry, no jaundice.   Psych: Alert and cooperative, normal mood and affect.  Labs:    Imaging Studies: MM 3D SCREEN BREAST BILATERAL  Result Date: 02/06/2022 CLINICAL DATA:  Screening. EXAM: DIGITAL SCREENING BILATERAL MAMMOGRAM WITH TOMOSYNTHESIS AND CAD TECHNIQUE: Bilateral screening digital craniocaudal and mediolateral oblique mammograms were obtained. Bilateral screening digital breast tomosynthesis was performed. The images were evaluated with computer-aided detection. COMPARISON:  Previous exam(s). ACR Breast Density Category c: The breast tissue is heterogeneously dense, which may obscure small masses. FINDINGS: There are  no findings suspicious for malignancy. IMPRESSION: No mammographic evidence of malignancy. A result letter of this screening mammogram will be mailed directly to the patient. RECOMMENDATION: Screening mammogram in one year. (Code:SM-B-01Y) BI-RADS CATEGORY  1: Negative. Electronically Signed   By: Claudie Revering M.D.   On: 02/06/2022 16:22    Assessment and Plan:   Maniah Nading is a 82 y.o. y/o female who comes in with a history of a rectal lesion that was sent for EUS with findings consistent with an inflammatory submucosal lesion.  The patient is having no problems or issues with it at the present time.  The patient had a normal alpha-fetoprotein and is set up for an ultrasound.  The patient will need an ultrasound and alpha-fetoprotein every 6 months.  Nothing further to do from a GI point of view at this time.  The patient has normal liver enzymes at her last blood test.  The patient will follow-up as needed.     [name], MD. Marval Regal    Note: This dictation was prepared with Dragon dictation along with smaller phrase technology. Any transcriptional errors that result from this process are unintentional.

## 2022-03-07 ENCOUNTER — Ambulatory Visit: Payer: Medicare PPO | Admitting: Gastroenterology

## 2022-03-07 ENCOUNTER — Ambulatory Visit
Admission: RE | Admit: 2022-03-07 | Discharge: 2022-03-07 | Disposition: A | Discharge status: Home / Self Care | Payer: Medicare PPO | Source: Ambulatory Visit | Attending: Internal Medicine | Admitting: Internal Medicine

## 2022-03-07 DIAGNOSIS — D61818 Other pancytopenia: Secondary | ICD-10-CM | POA: Insufficient documentation

## 2022-03-07 DIAGNOSIS — R161 Splenomegaly, not elsewhere classified: Secondary | ICD-10-CM

## 2022-03-20 LAB — CUP PACEART REMOTE DEVICE CHECK
Battery Voltage: 90
Date Time Interrogation Session: 20240207081414
Implantable Lead Connection Status: 753985
Implantable Lead Connection Status: 753985
Implantable Lead Implant Date: 20230210
Implantable Lead Implant Date: 20230210
Implantable Lead Location: 753859
Implantable Lead Location: 753860
Implantable Lead Model: 377169
Implantable Lead Model: 377171
Implantable Lead Serial Number: 7000400966
Implantable Lead Serial Number: 7000402075
Implantable Pulse Generator Implant Date: 20230210
Pulse Gen Model: 407145
Pulse Gen Serial Number: 70331369

## 2022-03-22 ENCOUNTER — Ambulatory Visit: Payer: Medicare PPO

## 2022-03-22 DIAGNOSIS — I495 Sick sinus syndrome: Secondary | ICD-10-CM | POA: Diagnosis not present

## 2022-03-23 ENCOUNTER — Other Ambulatory Visit: Payer: Self-pay | Admitting: Internal Medicine

## 2022-03-25 NOTE — Telephone Encounter (Signed)
Please schedule overdue F/U appointment for 90 day refills. Thank you! 

## 2022-03-28 ENCOUNTER — Telehealth: Payer: Self-pay | Admitting: Internal Medicine

## 2022-03-28 DIAGNOSIS — E611 Iron deficiency: Secondary | ICD-10-CM

## 2022-03-28 NOTE — Addendum Note (Signed)
Addended by: Vanice Sarah on: 03/28/2022 03:02 PM   Modules accepted: Orders

## 2022-03-28 NOTE — Telephone Encounter (Addendum)
Pt asked to be seen. Feeling weak

## 2022-03-29 MED FILL — Iron Sucrose Inj 20 MG/ML (Fe Equiv): INTRAVENOUS | Qty: 10 | Status: AC

## 2022-04-01 ENCOUNTER — Encounter: Payer: Self-pay | Admitting: Medical Oncology

## 2022-04-01 ENCOUNTER — Inpatient Hospital Stay (HOSPITAL_BASED_OUTPATIENT_CLINIC_OR_DEPARTMENT_OTHER): Payer: Medicare PPO | Admitting: Medical Oncology

## 2022-04-01 ENCOUNTER — Inpatient Hospital Stay: Payer: Medicare PPO | Attending: Internal Medicine

## 2022-04-01 ENCOUNTER — Other Ambulatory Visit: Payer: Self-pay | Admitting: Physician Assistant

## 2022-04-01 ENCOUNTER — Inpatient Hospital Stay: Payer: Medicare PPO

## 2022-04-01 ENCOUNTER — Telehealth: Payer: Self-pay

## 2022-04-01 VITALS — BP 117/69 | HR 65 | Temp 97.8°F | Wt 143.0 lb

## 2022-04-01 DIAGNOSIS — D61818 Other pancytopenia: Secondary | ICD-10-CM | POA: Insufficient documentation

## 2022-04-01 DIAGNOSIS — K766 Portal hypertension: Secondary | ICD-10-CM | POA: Insufficient documentation

## 2022-04-01 DIAGNOSIS — E611 Iron deficiency: Secondary | ICD-10-CM | POA: Diagnosis not present

## 2022-04-01 DIAGNOSIS — D6959 Other secondary thrombocytopenia: Secondary | ICD-10-CM | POA: Insufficient documentation

## 2022-04-01 DIAGNOSIS — Z86000 Personal history of in-situ neoplasm of breast: Secondary | ICD-10-CM | POA: Insufficient documentation

## 2022-04-01 DIAGNOSIS — R5383 Other fatigue: Secondary | ICD-10-CM

## 2022-04-01 DIAGNOSIS — R350 Frequency of micturition: Secondary | ICD-10-CM

## 2022-04-01 DIAGNOSIS — N920 Excessive and frequent menstruation with regular cycle: Secondary | ICD-10-CM | POA: Diagnosis present

## 2022-04-01 DIAGNOSIS — K7460 Unspecified cirrhosis of liver: Secondary | ICD-10-CM | POA: Diagnosis not present

## 2022-04-01 DIAGNOSIS — I4891 Unspecified atrial fibrillation: Secondary | ICD-10-CM | POA: Insufficient documentation

## 2022-04-01 DIAGNOSIS — D500 Iron deficiency anemia secondary to blood loss (chronic): Secondary | ICD-10-CM | POA: Insufficient documentation

## 2022-04-01 LAB — CBC WITH DIFFERENTIAL (CANCER CENTER ONLY)
Abs Immature Granulocytes: 0 10*3/uL (ref 0.00–0.07)
Basophils Absolute: 0 10*3/uL (ref 0.0–0.1)
Basophils Relative: 1 %
Eosinophils Absolute: 0.1 10*3/uL (ref 0.0–0.5)
Eosinophils Relative: 3 %
HCT: 31 % — ABNORMAL LOW (ref 36.0–46.0)
Hemoglobin: 10.4 g/dL — ABNORMAL LOW (ref 12.0–15.0)
Immature Granulocytes: 0 %
Lymphocytes Relative: 28 %
Lymphs Abs: 0.6 10*3/uL — ABNORMAL LOW (ref 0.7–4.0)
MCH: 31.4 pg (ref 26.0–34.0)
MCHC: 33.5 g/dL (ref 30.0–36.0)
MCV: 93.7 fL (ref 80.0–100.0)
Monocytes Absolute: 0.2 10*3/uL (ref 0.1–1.0)
Monocytes Relative: 11 %
Neutro Abs: 1.2 10*3/uL — ABNORMAL LOW (ref 1.7–7.7)
Neutrophils Relative %: 57 %
Platelet Count: 61 10*3/uL — ABNORMAL LOW (ref 150–400)
RBC: 3.31 MIL/uL — ABNORMAL LOW (ref 3.87–5.11)
RDW: 14.2 % (ref 11.5–15.5)
WBC Count: 2 10*3/uL — ABNORMAL LOW (ref 4.0–10.5)
nRBC: 0 % (ref 0.0–0.2)

## 2022-04-01 LAB — URINALYSIS, COMPLETE (UACMP) WITH MICROSCOPIC
Bacteria, UA: NONE SEEN
Bilirubin Urine: NEGATIVE
Glucose, UA: NEGATIVE mg/dL
Hgb urine dipstick: NEGATIVE
Ketones, ur: NEGATIVE mg/dL
Leukocytes,Ua: NEGATIVE
Nitrite: NEGATIVE
Protein, ur: NEGATIVE mg/dL
Specific Gravity, Urine: 1.02 (ref 1.005–1.030)
pH: 7 (ref 5.0–8.0)

## 2022-04-01 LAB — IRON AND TIBC
Iron: 100 ug/dL (ref 28–170)
Saturation Ratios: 27 % (ref 10.4–31.8)
TIBC: 365 ug/dL (ref 250–450)
UIBC: 265 ug/dL

## 2022-04-01 LAB — FERRITIN: Ferritin: 193 ng/mL (ref 11–307)

## 2022-04-01 NOTE — Progress Notes (Signed)
[facility] OFFICE PROGRESS NOTE  Patient Care Team: Albina Billet, MD as PCP - General (Internal Medicine) End, Harrell Gave, MD as PCP - Cardiology (Cardiology) Vickie Epley, MD as PCP - Electrophysiology (Cardiology) Cammie Sickle, MD as Consulting Physician (Internal Medicine)   Cancer Staging  No matching staging information was found for the patient.   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 OCT 2018- BMBx- MILD dyspoiesis; variable cellularity [10-50%]; FISH/Cytogenetics-NORMAL; F-One-NGS-declined by insurance.    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   #Frequent UTIs [2019-2020; Dr. Brandon/uro-gyne,[facility]]; Dr.Wohl/  # HRT [clinical trial thru [employer] [facility]; stopped July 2017 ]; CPAP   DIAGNOSIS: Left breast DCIS  STAGE:    0     ;GOALS: cure  CURRENT/MOST RECENT THERAPY: surveillaince    Ductal carcinoma in situ (DCIS) of left breast    INTERVAL HISTORY: Alone.  Ambulating independently.  Annette Foster 82 y.o.  female pleasant patient above history of DCIS; cirrhosis portal hypertension; A. fib and pancytopenia is here for IDA follow up:  Patient reports that recently he has been having fatigue. 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 Over the past 3 weeks she has been having sinus congestion as well as increased fatigue. She has been seeing her allergist for this and has been on 3 weeks  works of antibiotics without improvement. At her last visit last week ENT was recommended. She thought the increased fatigue may be due to low ferritin levels. She has tolerated IV infusions well in the past. Last IV infusion was on 02/15/2022-Venofer. Last ferritin level was 166 on 02/15/2022. No bleeding. No cough, sob, dysuria, skin infections, fevers.    Review of Systems  Constitutional:  Positive for malaise/fatigue. Negative for chills, diaphoresis, fever and weight loss.  HENT:  Positive for congestion, ear discharge and sinus pain. Negative for nosebleeds and sore throat.   Eyes:  Negative for double vision.  Respiratory:  Negative for cough, hemoptysis, sputum production, shortness of breath and wheezing.   Cardiovascular:  Negative for chest pain, palpitations, orthopnea and leg swelling.  Gastrointestinal:  Negative for abdominal pain, blood in stool, constipation, diarrhea, heartburn, melena, nausea and vomiting.  Musculoskeletal:  Positive for back pain and joint pain.  Skin: Negative.  Negative for itching and rash.  Neurological:  Negative for dizziness, tingling, focal weakness, weakness and headaches.  Endo/Heme/Allergies:  Does not bruise/bleed easily.  Psychiatric/Behavioral:  Negative for depression. The patient is not nervous/anxious and does not have insomnia.       PAST MEDICAL HISTORY :  Past Medical History:  Diagnosis Date   Abdominal pain    Allergy    Cancer ([place]) 2017   breast cancer- Left   Cataract    CHF (congestive heart failure) (HCC)    Collagenous colitis    Diabetes mellitus without complication ([place])    Diarrhea    Diverticulosis    Dysrhythmia    Fatty liver disease, nonalcoholic    Fibrocystic breast  GERD (gastroesophageal reflux disease)    Heart murmur    Hyperlipidemia    Hypertension    Hypothyroidism    IBS (irritable bowel syndrome)    IDA (iron deficiency anemia)    Personal history of radiation therapy 2017   LEFT BREAST CA    PONV (postoperative nausea and vomiting)    Presence of permanent cardiac pacemaker    Sleep apnea    C-Pap    PAST SURGICAL HISTORY :   Past Surgical History:  Procedure Laterality Date   ABDOMINAL HYSTERECTOMY     [place] bso   ABDOMINAL SURGERY     APPENDECTOMY     AV NODE ABLATION N/A 05/17/2021   Procedure: AV NODE ABLATION;  Surgeon: Vickie Epley, MD;  Location: [facility];  Service: Cardiovascular;  Laterality: N/A;   BREAST BIOPSY Bilateral 2016   negative   BREAST 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/2015   DCIS, papillary carcinoma in situ   BREAST BIOPSY Left 05/27/2016   BENIGN MAMMARY EPITHELIUM   BREAST BIOPSY Left 11/20/2017   affirm bx x clip BENIGN MAMMARY EPITHELIUM CONSISTENT WITH RAD THERAPY   BREAST EXCISIONAL BIOPSY Right    NEG 1980's   BREAST LUMPECTOMY Left 10/17/2015   DCIS and papillary carcinoma insitu, clear margins   CARDIAC SURGERY     has replacement valve   CATARACT EXTRACTION Right    CATARACT EXTRACTION W/PHACO Left 01/16/2021   Procedure: CATARACT EXTRACTION PHACO AND INTRAOCULAR LENS PLACEMENT (IOC) LEFT DIABETIC 8.46 00:59.8;  Surgeon: Birder Robson, MD;  Location: [facility];  Service: Ophthalmology;  Laterality: Left;   CHOLECYSTECTOMY     COLONOSCOPY WITH PROPOFOL N/A 03/11/2016   Procedure: COLONOSCOPY WITH PROPOFOL;  Surgeon: Manya Silvas, MD;  Location: [facility] ENDOSCOPY;  Service: Endoscopy;  Laterality: N/A;   COLONOSCOPY WITH PROPOFOL N/A 02/18/2020   Procedure: COLONOSCOPY WITH PROPOFOL;  Surgeon: [name], MD;  Location: [facility] ENDOSCOPY;  Service: Endoscopy;  Laterality: N/A;   ESOPHAGOGASTRODUODENOSCOPY (EGD) WITH PROPOFOL N/A 03/11/2016   Procedure: ESOPHAGOGASTRODUODENOSCOPY (EGD) WITH PROPOFOL;  Surgeon: Manya Silvas, MD;  Location: [facility] ENDOSCOPY;  Service: Endoscopy;  Laterality: N/A;   ESOPHAGOGASTRODUODENOSCOPY (EGD) WITH PROPOFOL N/A 02/18/2020   Procedure: ESOPHAGOGASTRODUODENOSCOPY (EGD) WITH PROPOFOL;   Surgeon: [name], MD;  Location: [facility] ENDOSCOPY;  Service: Endoscopy;  Laterality: N/A;   ESOPHAGOGASTRODUODENOSCOPY (EGD) WITH PROPOFOL N/A 04/25/2020   Procedure: ESOPHAGOGASTRODUODENOSCOPY (EGD) WITH PROPOFOL;  Surgeon: [name], MD;  Location: [facility] ENDOSCOPY;  Service: Endoscopy;  Laterality: N/A;   ESOPHAGOGASTRODUODENOSCOPY (EGD) WITH PROPOFOL N/A 05/23/2020   Procedure: ESOPHAGOGASTRODUODENOSCOPY (EGD) WITH PROPOFOL;  Surgeon: [name], MD;  Location: [facility] ENDOSCOPY;  Service: Endoscopy;  Laterality: N/A;   EUS N/A 10/04/2021   Procedure: LOWER ENDOSCOPIC ULTRASOUND (EUS);  Surgeon: Reita Cliche, MD;  Location: [facility] ENDOSCOPY;  Service: Gastroenterology;  Laterality: N/A;  [facility]   FINGER ARTHROSCOPY WITH CARPOMETACARPEL ([place]) ARTHROPLASTY Right 09/03/2018   Procedure: CARPOMETACARPEL [facility]) ARTHROPLASTY RIGHT THUMB;  Surgeon: Hessie Knows, MD;  Location: [facility] ORS;  Service: Orthopedics;  Laterality: Right;   FLEXIBLE SIGMOIDOSCOPY N/A 09/14/2021   Procedure: FLEXIBLE SIGMOIDOSCOPY;  Surgeon: [name], MD;  Location: [facility] ENDOSCOPY;  Service: Endoscopy;  Laterality: N/A;  No anesthesia   GANGLION CYST EXCISION Right 09/03/2018   Procedure: REMOVAL GANGLION OF WRIST;  Surgeon: Hessie Knows, MD;  Location: [facility];  Service: Orthopedics;  Laterality: Right;   HARDWARE REMOVAL Right 09/03/2018   Procedure: HARDWARE REMOVAL RIGHT THUMB;  Surgeon: Hessie Knows, MD;  Location: [facility];  Service: Orthopedics;  Laterality: Right;  staple removed   JOINT REPLACEMENT Left    TKR   left sinusplasty      MASTECTOMY, PARTIAL Left 10/17/2015   Procedure: MASTECTOMY PARTIAL REVISION;  Surgeon: Leonie Green, MD;  Location: [facility];  Service: General;  Laterality: Left;   PACEMAKER IMPLANT N/A 03/23/2021   Procedure: PACEMAKER IMPLANT;  Surgeon: Vickie Epley, MD;  Location: [place] [facility];  Service: Cardiovascular;  Laterality: N/A;   PARTIAL MASTECTOMY WITH  NEEDLE LOCALIZATION Left 09/29/2015   Procedure: PARTIAL MASTECTOMY WITH NEEDLE LOCALIZATION;  Surgeon: Leonie Green, MD;  Location: [facility];  Service: General;  Laterality: Left;   RECTAL EXAM UNDER ANESTHESIA N/A 10/05/2021   Procedure: RECTAL EXAM UNDER ANESTHESIA, ASPIRATION OF RECTAL CYST;  Surgeon: Herbert Pun, MD;  Location: [facility];  Service: General;  Laterality: N/A;   TOTAL ABDOMINAL HYSTERECTOMY W/ BILATERAL SALPINGOOPHORECTOMY      FAMILY HISTORY :   Family History  Problem Relation Age of Onset   Breast cancer Paternal Grandmother    Colon cancer Father    Diabetes Sister    Diabetes Brother    Heart disease Brother    Prostate cancer Brother    Colon cancer Maternal Uncle    Prostate cancer Brother    Bladder Cancer Brother    Leukemia Mother        all   Ovarian cancer Neg Hx    Kidney cancer Neg Hx     SOCIAL HISTORY:   Social History   Tobacco Use   Smoking status: Never   Smokeless tobacco: Never  Vaping Use   Vaping Use: Never used  Substance Use Topics   Alcohol use: No    Alcohol/week: 0.0 standard drinks of alcohol   Drug use: No    ALLERGIES:  is allergic to codeine, demeclocycline, demerol [meperidine], hydrocodone, oxycodone, pentazocine, tetracyclines & related, and fentanyl.  MEDICATIONS:  Current Outpatient Medications  Medication Sig Dispense Refill   acetaminophen (TYLENOL) 325 MG tablet Take 1-2 tablets (325-650 mg total) by mouth every 4 (four) hours as needed for mild pain.     Cholecalciferol (VITAMIN D) 50 MCG (2000 UT) CAPS Take 2,000 Units by mouth daily.     ciprofloxacin (CIPRO) 500 MG tablet Take 500 mg by mouth 2 (two) times daily.     clobetasol ointment (TEMOVATE) [id number] % Apply 1 application topically as needed (vaginal irritation).     clotrimazole (LOTRIMIN) 1 % cream Apply 1 Application topically 2 (two) times daily as needed.     cyanocobalamin (,VITAMIN B-12,) 1000 MCG/ML injection 1,000 mcg every 30  (thirty) days.     dicyclomine (BENTYL) 10 MG capsule Take 10 mg by mouth in the morning, at noon, and at bedtime.     docusate sodium (COLACE) 100 MG capsule Take 100 mg by mouth 3 (three) times daily as needed for moderate constipation.     EPINEPHrine 0.3 mg/0.3 mL IJ SOAJ injection Inject 0.3 mg into the muscle as needed for anaphylaxis.     esomeprazole (NEXIUM) 40 MG capsule Take 40 mg by mouth 2 (two) times daily before a meal.      estradiol (ESTRACE) 0.1 MG/GM vaginal cream Place 1 Applicatorful vaginally daily as needed (irritation).     fenofibrate 160 MG tablet Take 160 mg by mouth at bedtime.     gabapentin (NEURONTIN) 300 MG capsule Take 300 mg by mouth at bedtime.  hydrOXYzine (ATARAX/VISTARIL) 25 MG tablet Take 25 mg by mouth every 6 (six) hours as needed for anxiety, itching, nausea or vomiting.     Insulin Degludec (TRESIBA) 100 UNIT/ML SOLN Inject 184 Units into the skin at bedtime.     levocetirizine (XYZAL) 5 MG tablet Take 5 mg by mouth every evening.     levothyroxine (SYNTHROID, LEVOTHROID) 75 MCG tablet Take 75 mcg by mouth daily before breakfast.      magnesium oxide (MAG-OX) 400 MG tablet Take 400 mg by mouth 2 (two) times daily.      MOUNJARO 7.5 MG/0.5ML Pen Inject 7.5 mg into the skin once a week.     nadolol (CORGARD) 20 MG tablet Take 20 mg by mouth daily.     NONFORMULARY OR COMPOUNDED ITEM Nifedipine 0.3% plus lidocaine 2% cream apply to rectum BID and after every BM.     NOVOLOG FLEXPEN 100 UNIT/ML FlexPen Inject 48-68 Units into the skin 3 (three) times daily with meals. Based on sliding scale     potassium chloride SA (K-DUR,KLOR-CON) 20 MEQ tablet Take 20 mEq by mouth 2 (two) times daily.      saccharomyces boulardii (FLORASTOR) 250 MG capsule Take 250 mg by mouth 2 (two) times daily.      simvastatin (ZOCOR) 20 MG tablet Take 20 mg by mouth at bedtime.     spironolactone (ALDACTONE) 25 MG tablet TAKE 1/2 TABLET BY MOUTH EVERY DAY 45 tablet 1   torsemide  (DEMADEX) 20 MG tablet TAKE 1 TABLET BY MOUTH EVERY DAY 90 tablet 0   valACYclovir (VALTREX) 500 MG tablet Take 500 mg by mouth 2 (two) times daily as needed (fever blisters).  1   No current facility-administered medications for this visit.    PHYSICAL EXAMINATION: ECOG PERFORMANCE STATUS: 1 - Symptomatic but completely ambulatory  BP 117/69 (BP Location: Right Arm, Patient Position: Sitting)   Pulse 65   Temp 97.8 F (36.6 C) (Tympanic)   Wt 143 lb (64.9 kg)   LMP  (LMP Unknown)   SpO2 100%   BMI 25.33 kg/m   Filed Weights   04/01/22 1308  Weight: 143 lb (64.9 kg)    Physical Exam Constitutional:      Comments: Alone.  Ambulating independently. Pt does sound congested  HENT:     Head: Normocephalic and atraumatic.     Mouth/Throat:     Pharynx: No oropharyngeal exudate.  Eyes:     Pupils: Pupils are equal, round, and reactive to light.  Cardiovascular:     Rate and Rhythm: Normal rate and regular rhythm.  Pulmonary:     Effort: Pulmonary effort is normal. No respiratory distress.     Breath sounds: Normal breath sounds. No wheezing.  Abdominal:     General: Bowel sounds are normal. There is no distension.     Palpations: Abdomen is soft. There is no mass.     Tenderness: There is no abdominal tenderness. There is no guarding or rebound.     Comments: Positive for splenomegaly.  Musculoskeletal:        General: No tenderness. Normal range of motion.     Cervical back: Normal range of motion and neck supple.  Skin:    General: Skin is warm.  Neurological:     Mental Status: She is alert and oriented to person, place, and time.  Psychiatric:        Mood and Affect: Affect normal.      LABORATORY DATA:  I have reviewed  the data as listed    Component Value Date/Time   NA 136 02/15/2022 1306   NA 137 04/26/2021 1439   K 3.7 02/15/2022 1306   CL 99 02/15/2022 1306   CO2 28 02/15/2022 1306   GLUCOSE 273 (H) 02/15/2022 1306   BUN 17 02/15/2022 1306   BUN  19 04/26/2021 1439   CREATININE 0.89 02/15/2022 1306   CALCIUM 8.5 (L) 02/15/2022 1306   PROT 7.4 02/15/2022 1306   PROT 7.1 11/30/2020 0944   ALBUMIN 3.6 02/15/2022 1306   ALBUMIN 3.8 11/30/2020 0944   AST 27 02/15/2022 1306   ALT 17 02/15/2022 1306   ALKPHOS 62 02/15/2022 1306   BILITOT 0.7 02/15/2022 1306   BILITOT 0.8 11/30/2020 0944   GFRNONAA >60 02/15/2022 1306   GFRAA 80 02/25/2020 1105    No results found for: "SPEP", "UPEP"  Lab Results  Component Value Date   WBC 2.0 (L) 04/01/2022   NEUTROABS 1.2 (L) 04/01/2022   HGB 10.4 (L) 04/01/2022   HCT 31.0 (L) 04/01/2022   MCV 93.7 04/01/2022   PLT 61 (L) 04/01/2022      Chemistry      Component Value Date/Time   NA 136 02/15/2022 1306   NA 137 04/26/2021 1439   K 3.7 02/15/2022 1306   CL 99 02/15/2022 1306   CO2 28 02/15/2022 1306   BUN 17 02/15/2022 1306   BUN 19 04/26/2021 1439   CREATININE 0.89 02/15/2022 1306      Component Value Date/Time   CALCIUM 8.5 (L) 02/15/2022 1306   ALKPHOS 62 02/15/2022 1306   AST 27 02/15/2022 1306   ALT 17 02/15/2022 1306   BILITOT 0.7 02/15/2022 1306   BILITOT 0.8 11/30/2020 0944       RADIOGRAPHIC STUDIES: I have personally reviewed the radiological images as listed and agreed with the findings in the report. No results found.   ASSESSMENT & PLAN:  Encounter Diagnoses  Name Primary?   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 Likely secondary to sinusitis. I agree that she would benefit from ENT consult. Pt will schedule. Glad she is being covered with ABX. Checking a urine today as she states that she has had urinary frequency when asking about potential sources of other infection given her fatigue. No dysuria, abdominal pain or fever so we will not cover empirically.   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 Based on patients labs  from one month ago, recent iron infusion and stable hgb I do not suspect her fatigue to be from her IDA. Ferritin pending at time of visit. Has follow up in 3 months which I feel is appropriate given the above.   Urinary Frequency: New. UA/culture pending.   Pancytopenia: Chronic. Overall her leukopenia, thrombocytopenia and anemia are stable to improved on todays labs. See above regarding presumed sinus infections.    Orders Placed This Encounter  Procedures   Urine Culture    Standing Status:   Future    Standing Expiration Date:   04/02/2023   Urine Culture    Standing Status:   Future    Standing Expiration Date:   04/02/2023   Urinalysis, Complete w Microscopic    Standing Status:   Future    Standing Expiration Date:   04/02/2023   Urinalysis, Complete w Microscopic    Standing Status:  Future    Standing Expiration Date:   04/02/2023   All questions were answered. The patient knows to call the clinic with any problems, questions or concerns.      Hughie Closs, PA-C 04/01/2022 1:42 PM

## 2022-04-02 ENCOUNTER — Encounter: Payer: Self-pay | Admitting: Medical

## 2022-04-02 ENCOUNTER — Ambulatory Visit: Payer: Medicare PPO | Attending: Medical | Admitting: Medical

## 2022-04-02 VITALS — BP 102/62 | HR 75 | Ht 65.0 in | Wt 141.2 lb

## 2022-04-02 DIAGNOSIS — Z8719 Personal history of other diseases of the digestive system: Secondary | ICD-10-CM

## 2022-04-02 DIAGNOSIS — I495 Sick sinus syndrome: Secondary | ICD-10-CM | POA: Diagnosis not present

## 2022-04-02 DIAGNOSIS — I4891 Unspecified atrial fibrillation: Secondary | ICD-10-CM | POA: Diagnosis not present

## 2022-04-02 DIAGNOSIS — I5032 Chronic diastolic (congestive) heart failure: Secondary | ICD-10-CM

## 2022-04-02 DIAGNOSIS — K7460 Unspecified cirrhosis of liver: Secondary | ICD-10-CM

## 2022-04-02 DIAGNOSIS — I480 Paroxysmal atrial fibrillation: Secondary | ICD-10-CM

## 2022-04-02 LAB — URINE CULTURE: Culture: NO GROWTH

## 2022-04-02 NOTE — Progress Notes (Signed)
Cardiology Office Note:    Date:  04/02/2022   ID:  Nyjae, Culliver 29-Aug-1940, MRN [id number]  PCP:  Albina Billet, MD  [facility] Cardiologist:  Nelva Bush, MD  [facility] Electrophysiologist:  Vickie Epley, MD   Referring MD: Albina Billet, MD   Chief Complaint: 6 month follow-up  History of Present Illness:    Annette Foster is a 82 y.o. female with a hx of remote valve surgery in the 1970s, HFpEF, paroxysmal afib complicated by GIB on a/c, HTN, HLD, NASH, seizure disorder, OSA, left breast cancer s/p mastectomy who presents for follow-up of afib and valvular heart disease.    Seen by EP for watchman, however left atrial appendage morphology precludes watchman placement. Anticoagulation was not pursued given h/o GIB with esophageal varices.    The patient was referred back to EP for AV junction ablation and  permanent pacemaker. When in afib patient very symptomatic with RVR, when in NSR she is brardycardic. She underwent device implant 03/23/21. Patient underwent AV junction ablation 8 weeks later.  She was not felt to be a good candidate for watchman due to inappropriate left atrial appendage anatomy.  Patient was last seen 08/22/2021 and was doing well from a cardiac perspective.   Today, the patient is overall doing well. She denies any changes. She is fairly active. She has been tired, saw oncologist and they did blood work. She may need another iron infusion. She denies chest pain and shortness of breath. EKG shows AV paced rhythm. No lower leg edema, orthopnea or pnd. No further afib episodes. No more bleeding issues, she saw GI in January 2024.   Past Medical History:  Diagnosis Date   Abdominal pain    Allergy    Cancer ([place]) 2017   breast cancer- Left   Cataract    CHF (congestive heart failure) (HCC)    Collagenous colitis    Diabetes mellitus without complication (HCC)    Diarrhea    Diverticulosis    Dysrhythmia    Fatty liver  disease, nonalcoholic    Fibrocystic breast    GERD (gastroesophageal reflux disease)    Heart murmur    Hyperlipidemia    Hypertension    Hypothyroidism    IBS (irritable bowel syndrome)    IDA (iron deficiency anemia)    Personal history of radiation therapy 2017   LEFT BREAST CA   PONV (postoperative nausea and vomiting)    Presence of permanent cardiac pacemaker    Sleep apnea    C-Pap    Past Surgical History:  Procedure Laterality Date   ABDOMINAL HYSTERECTOMY     [place] bso   ABDOMINAL SURGERY     APPENDECTOMY     AV NODE ABLATION N/A 05/17/2021   Procedure: AV NODE ABLATION;  Surgeon: Vickie Epley, MD;  Location: [place] [facility];  Service: Cardiovascular;  Laterality: N/A;   BREAST BIOPSY Bilateral 2016   negative   BREAST 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/2015   DCIS, papillary carcinoma in situ   BREAST BIOPSY Left 05/27/2016   BENIGN MAMMARY EPITHELIUM   BREAST BIOPSY Left 11/20/2017   affirm bx x clip BENIGN MAMMARY EPITHELIUM CONSISTENT WITH RAD THERAPY   BREAST EXCISIONAL BIOPSY Right    NEG 1980's   BREAST LUMPECTOMY Left 10/17/2015   DCIS and papillary carcinoma insitu, clear margins   CARDIAC SURGERY     has replacement valve   CATARACT EXTRACTION Right    CATARACT EXTRACTION W/PHACO  Left 01/16/2021   Procedure: CATARACT EXTRACTION PHACO AND INTRAOCULAR LENS PLACEMENT (IOC) LEFT DIABETIC 8.46 00:59.8;  Surgeon: Birder Robson, MD;  Location: [place];  Service: Ophthalmology;  Laterality: Left;   CHOLECYSTECTOMY     COLONOSCOPY WITH PROPOFOL N/A 03/11/2016   Procedure: COLONOSCOPY WITH PROPOFOL;  Surgeon: Manya Silvas, MD;  Location: [facility] ENDOSCOPY;  Service: Endoscopy;  Laterality: N/A;   COLONOSCOPY WITH PROPOFOL N/A 02/18/2020   Procedure: COLONOSCOPY WITH PROPOFOL;  Surgeon: [name], MD;  Location: [facility] ENDOSCOPY;  Service: Endoscopy;  Laterality: N/A;   ESOPHAGOGASTRODUODENOSCOPY (EGD) WITH PROPOFOL N/A 03/11/2016   Procedure:  ESOPHAGOGASTRODUODENOSCOPY (EGD) WITH PROPOFOL;  Surgeon: Manya Silvas, MD;  Location[facility];  Service: Endoscopy;  Laterality: N/A;   ESOPHAGOGASTRODUODENOSCOPY (EGD) WITH PROPOFOL N/A 02/18/2020   Procedure: ESOPHAGOGASTRODUODENOSCOPY (EGD) WITH PROPOFOL;  Surgeon: [name], MD;  Location: [facility] ENDOSCOPY;  Service: Endoscopy;  Laterality: N/A;   ESOPHAGOGASTRODUODENOSCOPY (EGD) WITH PROPOFOL N/A 04/25/2020   Procedure: ESOPHAGOGASTRODUODENOSCOPY (EGD) WITH PROPOFOL;  Surgeon: [name], MD;  Location: [facility] ENDOSCOPY;  Service: Endoscopy;  Laterality: N/A;   ESOPHAGOGASTRODUODENOSCOPY (EGD) WITH PROPOFOL N/A 05/23/2020   Procedure: ESOPHAGOGASTRODUODENOSCOPY (EGD) WITH PROPOFOL;  Surgeon: [name], MD;  Location: [facility] ENDOSCOPY;  Service: Endoscopy;  Laterality: N/A;   EUS N/A 10/04/2021   Procedure: LOWER ENDOSCOPIC ULTRASOUND (EUS);  Surgeon: Reita Cliche, MD;  Location: [facility] END[facility]COPY;  Service: Gastroenterology;  Laterality: N/A;  [facility]   FINGER ARTHROSCOPY WITH CARPOMETACARPEL ([place]) ARTHROPLASTY Right 09/03/2018   Procedure: CARPOMETACARPEL [facility]) ARTHROPLASTY RIGHT THUMB;  Surgeon: Hessie Knows, MD;  Location: [facility] ORS;  Service: Orthopedics;  Laterality: Right;   FLEXIBLE SIGMOIDOSCOPY N/A 09/14/2021   Procedure: FLEXIBLE SIGMOIDOSCOPY;  Surgeon: [name], MD;  Location: [facility] ENDOSCOPY;  Service: Endoscopy;  Laterality: N/A;  No anesthesia   GANGLION CYST EXCISION Right 09/03/2018   Procedure: REMOVAL GANGLION OF WRIST;  Surgeon: Hessie Knows, MD;  Location: [facility];  Service: Orthopedics;  Laterality: Right;   HARDWARE REMOVAL Right 09/03/2018   Procedure: HARDWARE REMOVAL RIGHT THUMB;  Surgeon: Hessie Knows, MD;  Location: [facility];  Service: Orthopedics;  Laterality: Right;  staple removed   JOINT REPLACEMENT Left    TKR   left sinusplasty      MASTECTOMY, PARTIAL Left 10/17/2015   Procedure: MASTECTOMY PARTIAL REVISION;  Surgeon: Leonie Green, MD;  Location: [place] [facility];  Service: General;  Laterality: Left;   PACEMAKER IMPLANT N/A 03/23/2021   Procedure: PACEMAKER IMPLANT;  Surgeon: Vickie Epley, MD;  Location: [place] [facility];  Service: Cardiovascular;  Laterality: N/A;   PARTIAL MASTECTOMY WITH NEEDLE LOCALIZATION Left 09/29/2015   Procedure: PARTIAL MASTECTOMY WITH NEEDLE LOCALIZATION;  Surgeon: Leonie Green, MD;  Location: [facility];  Service: General;  Laterality: Left;   RECTAL EXAM UNDER ANESTHESIA N/A 10/05/2021   Procedure: RECTAL EXAM UNDER ANESTHESIA, ASPIRATION OF RECTAL CYST;  Surgeon: Herbert Pun, MD;  Location: [facility];  Service: General;  Laterality: N/A;   TOTAL ABDOMINAL HYSTERECTOMY W/ BILATERAL SALPINGOOPHORECTOMY      Current Medications: Current Meds  Medication Sig   acetaminophen (TYLENOL) 325 MG tablet Take 1-2 tablets (325-650 mg total) by mouth every 4 (four) hours as needed for mild pain.   Cholecalciferol (VITAMIN D) 50 MCG (2000 UT) CAPS Take 2,000 Units by mouth daily.   clobetasol ointment (TEMOVATE) [id number] % Apply 1 application topically as needed (vaginal irritation).   clotrimazole (LOTRIMIN) 1 % cream Apply 1 Application topically 2 (two) times  daily as needed.   cyanocobalamin (,VITAMIN B-12,) 1000 MCG/ML injection 1,000 mcg every 30 (thirty) days.   dicyclomine (BENTYL) 10 MG capsule Take 10 mg by mouth in the morning, at noon, and at bedtime.   docusate sodium (COLACE) 100 MG capsule Take 100 mg by mouth 3 (three) times daily as needed for moderate constipation.   EPINEPHrine 0.3 mg/0.3 mL IJ SOAJ injection Inject 0.3 mg into the muscle as needed for anaphylaxis.   esomeprazole (NEXIUM) 40 MG capsule Take 40 mg by mouth 2 (two) times daily before a meal.    estradiol (ESTRACE) 0.1 MG/GM vaginal cream Place 1 Applicatorful vaginally daily as needed (irritation).   fenofibrate 160 MG tablet Take 160 mg by mouth at bedtime.   gabapentin (NEURONTIN) 300 MG capsule Take  300 mg by mouth at bedtime.    hydrOXYzine (ATARAX/VISTARIL) 25 MG tablet Take 25 mg by mouth every 6 (six) hours as needed for anxiety, itching, nausea or vomiting.   Insulin Degludec (TRESIBA) 100 UNIT/ML SOLN Inject 184 Units into the skin at bedtime.   levocetirizine (XYZAL) 5 MG tablet Take 5 mg by mouth every evening.   levothyroxine (SYNTHROID, LEVOTHROID) 75 MCG tablet Take 75 mcg by mouth daily before breakfast.    magnesium oxide (MAG-OX) 400 MG tablet Take 400 mg by mouth 2 (two) times daily.    MOUNJARO 7.5 MG/0.5ML Pen Inject 12.5 mg into the skin once a week.   nadolol (CORGARD) 20 MG tablet Take 1 tablet (20 mg total) by mouth daily. Additional refill available at office vist   NONFORMULARY OR COMPOUNDED ITEM Nifedipine 0.3% plus lidocaine 2% cream apply to rectum BID and after every BM.   NOVOLOG FLEXPEN 100 UNIT/ML FlexPen Inject 48-68 Units into the skin 3 (three) times daily with meals. Based on sliding scale   potassium chloride SA (K-DUR,KLOR-CON) 20 MEQ tablet Take 20 mEq by mouth 2 (two) times daily.    saccharomyces boulardii (FLORASTOR) 250 MG capsule Take 250 mg by mouth 2 (two) times daily.    simvastatin (ZOCOR) 20 MG tablet Take 20 mg by mouth at bedtime.   spironolactone (ALDACTONE) 25 MG tablet TAKE 1/2 TABLET BY MOUTH EVERY DAY   torsemide (DEMADEX) 20 MG tablet TAKE 1 TABLET BY MOUTH EVERY DAY   valACYclovir (VALTREX) 500 MG tablet Take 500 mg by mouth 2 (two) times daily as needed (fever blisters).     Allergies:   Codeine, Demeclocycline, Demerol [meperidine], Hydrocodone, Other, Oxycodone, Pentazocine, Tetracyclines & related, Coal tar extract, Hydrocodone-acetaminophen, Salicylic acid, Tetracycline hcl, and Fentanyl   Social History   Socioeconomic History   Marital status: Married    Spouse name: Collins [place]   Number of children: Not on file   Years of education: Not on file   Highest education level: Not on file  Occupational History   Not on file   Tobacco Use   Smoking status: Never   Smokeless tobacco: Never  Vaping Use   Vaping Use: Never used  Substance and Sexual Activity   Alcohol use: No    Alcohol/week: 0.0 standard drinks of alcohol   Drug use: No   Sexual activity: Not Currently    Birth control/protection: Surgical  Other Topics Concern   Not on file  Social History Narrative    Husband lives at nursing home , lives with husbands sister   Social Determinants of Health   Financial Resource Strain: Not on file  Food Insecurity: Not on file  Transportation Needs: Not on  file  Physical Activity: Not on file  Stress: Not on file  Social Connections: Not on file     Family History: The patient's family history includes Bladder Cancer in her brother; Breast cancer in her paternal grandmother; Colon cancer in her father and maternal uncle; Diabetes in her brother and sister; Heart disease in her brother; Leukemia in her mother; Prostate cancer in her brother and brother. There is no history of Ovarian cancer or Kidney cancer.  ROS:   Please see the history of present illness.     All other systems reviewed and are negative.  EKGs/Labs/Other Studies Reviewed:    The following studies were reviewed today:  Echo 03/2021 1. Left ventricular ejection fraction, by estimation, is 55 to 60%. The  left ventricle has normal function. The left ventricle has no regional  wall motion abnormalities. There is moderate concentric left ventricular  hypertrophy. Left ventricular  diastolic parameters are indeterminate.   2. Right ventricular systolic function is normal. The right ventricular  size is normal. Mildly increased right ventricular wall thickness. There  is normal pulmonary artery systolic pressure. The estimated right  ventricular systolic pressure is [id number]  mmHg.   3. Left atrial size was mildly dilated.   4. A small pericardial effusion is present.   5. The mitral valve is normal in structure. No evidence of  mitral valve  regurgitation. No evidence of mitral stenosis.   6. The aortic valve is normal in structure. Aortic valve regurgitation is  not visualized. Aortic valve sclerosis is present, with no evidence of  aortic valve stenosis.   7. The inferior vena cava is normal in size with greater than 50%  respiratory variability, suggesting right atrial pressure of 3 mmHg.    EKG:  EKG is ordered today.  The ekg ordered today demonstrates AV paced rhythm, 75bpm,    Recent Labs: 02/15/2022: ALT 17; BUN 17; Creatinine, Ser 0.89; Potassium 3.7; Sodium 136 04/01/2022: Hemoglobin 10.4; Platelet Count 61  Recent Lipid Panel    Component Value Date/Time   CHOL 160 11/30/2020 0944   TRIG 156 (H) 11/30/2020 0944   HDL 26 (L) 11/30/2020 0944   CHOLHDL 6.2 (H) 11/30/2020 0944   LDLCALC 106 (H) 11/30/2020 0944   Physical Exam:    VS:  BP 102/62 (BP Location: Left Arm, Patient Position: Sitting, Cuff Size: Normal)   Pulse 75   Ht 5' 5"$  (1.651 m)   Wt 141 lb 4 oz (64.1 kg)   LMP  (LMP Unknown)   SpO2 98%   BMI 23.51 kg/m     Wt Readings from Last 3 Encounters:  04/02/22 141 lb 4 oz (64.1 kg)  04/01/22 143 lb (64.9 kg)  02/18/22 150 lb 8 oz (68.3 kg)     GEN:  Well nourished, well developed in no acute distress HEENT: Normal NECK: No JVD; No carotid bruits LYMPHATICS: No lymphadenopathy CARDIAC: RRR, + murmur no rubs, gallops RESPIRATORY:  Clear to auscultation without rales, wheezing or rhonchi  ABDOMEN: Soft, non-tender, non-distended MUSCULOSKELETAL:  No edema; No deformity  SKIN: Warm and dry NEUROLOGIC:  Alert and oriented x 3 PSYCHIATRIC:  Normal affect   ASSESSMENT:    1. Paroxysmal atrial fibrillation (HCC)   2. Tachycardia-bradycardia syndrome ([place])   3. Atrial fibrillation with RVR ([place])   4. Chronic diastolic heart failure ([place])   5. Hepatic cirrhosis, unspecified hepatic cirrhosis type, unspecified whether ascites present ([place])   6. History of GI bleed    PLAN:  In order of problems listed above:  Paroxysmal Afib Tachybrady syndrome She is s/p PPM and AV junciton ablation. PPM is functioning normally. She denies any recurrence of Afib. She is not a candidate for a/c given cirrhosis complicated by esophageal varices. She is not a candidate for watchman given unfavorable anatomy. She follows with EP.   Chronic HFpEF The patient is euvolemic on exam. She is taking Torsemide 11m daily, spironolactone 12.567mdaily, potassium 2053mBID, and Nadolol 19m69mily.   Cirrhosis and h/o GI bleed Patient denies further bleeding. She saw GI in January 2024, she will continue to follow with them. Continue spironolactone, torsemide and nadolol.  Disposition: Follow up in 6 month(s) with EP   Signed, Jeannett Dekoning H FuNinfa Meeker-C  04/02/2022 4:25 PM     [facility]

## 2022-04-02 NOTE — Patient Instructions (Signed)
Medication Instructions:  Your physician recommends that you continue on your current medications as directed. Please refer to the Current Medication list given to you today.  *If you need a refill on your cardiac medications before your next appointment, please call your pharmacy*   Lab Work: - None ordered If you have labs (blood work) drawn today and your tests are completely normal, you will receive your results only by: [place] (if you have MyChart) OR A paper copy in the mail If you have any lab test that is abnormal or we need to change your treatment, we will call you to review the results.   Testing/Procedures: - None ordered   Follow-Up: At [facility], you and your health needs are our priority.  As part of our continuing mission to provide you with exceptional heart care, we have created designated Provider Care Teams.  These Care Teams include your primary Cardiologist (physician) and Advanced Practice Providers (APPs -  Physician Assistants and Nurse Practitioners) who all work together to provide you with the care you need, when you need it.  We recommend signing up for the patient portal called "MyChart".  Sign up information is provided on this After Visit Summary.  MyChart is used to connect with patients for Virtual Visits (Telemedicine).  Patients are able to view lab/test results, encounter notes, upcoming appointments, etc.  Non-urgent messages can be sent to your provider as well.   To learn more about what you can do with MyChart, go to [email or web address].    Your next appointment:   6 month follow-up with EP 12 month follow-up  Provider:   Cadence Kathlen Mody, PA-C  Other Instructions - None

## 2022-04-03 NOTE — Telephone Encounter (Signed)
Patient call routed to [facility]

## 2022-04-09 NOTE — Progress Notes (Signed)
Remote pacemaker transmission.   

## 2022-04-12 ENCOUNTER — Telehealth: Payer: Self-pay | Admitting: Medical Oncology

## 2022-04-12 NOTE — Telephone Encounter (Signed)
We discussed her labs. Everything appears stable to even improved. If she has any upcoming planned surgeries her platelets will need to be over 100 but no planned procedures at this time.

## 2022-05-03 ENCOUNTER — Other Ambulatory Visit: Payer: Self-pay | Admitting: Internal Medicine

## 2022-05-16 ENCOUNTER — Encounter: Payer: Self-pay | Admitting: Podiatry

## 2022-05-16 ENCOUNTER — Ambulatory Visit (INDEPENDENT_AMBULATORY_CARE_PROVIDER_SITE_OTHER): Payer: Medicare PPO | Admitting: Podiatry

## 2022-05-16 DIAGNOSIS — M79675 Pain in left toe(s): Secondary | ICD-10-CM | POA: Diagnosis not present

## 2022-05-16 DIAGNOSIS — E1142 Type 2 diabetes mellitus with diabetic polyneuropathy: Secondary | ICD-10-CM

## 2022-05-16 DIAGNOSIS — M79674 Pain in right toe(s): Secondary | ICD-10-CM

## 2022-05-16 DIAGNOSIS — B351 Tinea unguium: Secondary | ICD-10-CM | POA: Diagnosis not present

## 2022-05-16 NOTE — Progress Notes (Signed)
This patient returns to my office for at risk foot care.  This patient requires this care by a professional since this patient will be at risk due to having diabetes  This patient is unable to cut nails himself since the patient cannot reach his nails.These nails are painful walking and wearing shoes.  This patient presents for at risk foot care today.    General Appearance  Alert, conversant and in no acute stress.  Vascular  Dorsalis pedis and posterior tibial  pulses are palpable  bilaterally.  Capillary return is within normal limits  bilaterally. Temperature is within normal limits  bilaterally.  Neurologic  Senn-Weinstein monofilament test dimininished  bilaterally. Muscle power within normal limits bilaterally.  Nails Thick disfigured discolored nails with subungual debris  from hallux to fifth toes bilaterally. No evidence of bacterial infection or drainage bilaterally.  Orthopedic  No limitations of motion  feet .  No crepitus or effusions noted.  No bony pathology or digital deformities noted. HAV  B/L.  Hammer toes  B/L.  Midfoot DJD  B/L.  Skin  normotropic skin with no porokeratosis noted bilaterally.  No signs of infections or ulcers noted.     Onychomycosis  Pain in right toes  Pain in left toes  Consent was obtained for treatment procedures.   Mechanical debridement of nails 1-5  bilaterally performed with a nail nipper.  Filed with dremel without incident.     Return office visit    4 months                Told patient to return for periodic foot care and evaluation due to potential at risk complications.   Gardiner Barefoot DPM

## 2022-06-14 MED FILL — Iron Sucrose Inj 20 MG/ML (Fe Equiv): INTRAVENOUS | Qty: 10 | Status: AC

## 2022-06-17 ENCOUNTER — Inpatient Hospital Stay (HOSPITAL_BASED_OUTPATIENT_CLINIC_OR_DEPARTMENT_OTHER): Payer: Medicare PPO | Admitting: Internal Medicine

## 2022-06-17 ENCOUNTER — Inpatient Hospital Stay: Payer: Medicare PPO

## 2022-06-17 ENCOUNTER — Inpatient Hospital Stay: Payer: Medicare PPO | Attending: Internal Medicine

## 2022-06-17 ENCOUNTER — Encounter: Payer: Self-pay | Admitting: Internal Medicine

## 2022-06-17 VITALS — BP 115/73 | HR 94 | Temp 96.1°F | Ht 65.0 in | Wt 140.4 lb

## 2022-06-17 DIAGNOSIS — I83893 Varicose veins of bilateral lower extremities with other complications: Secondary | ICD-10-CM | POA: Diagnosis not present

## 2022-06-17 DIAGNOSIS — Z800 Family history of malignant neoplasm of digestive organs: Secondary | ICD-10-CM | POA: Insufficient documentation

## 2022-06-17 DIAGNOSIS — N920 Excessive and frequent menstruation with regular cycle: Secondary | ICD-10-CM | POA: Insufficient documentation

## 2022-06-17 DIAGNOSIS — D61818 Other pancytopenia: Secondary | ICD-10-CM | POA: Insufficient documentation

## 2022-06-17 DIAGNOSIS — D500 Iron deficiency anemia secondary to blood loss (chronic): Secondary | ICD-10-CM | POA: Diagnosis present

## 2022-06-17 DIAGNOSIS — R161 Splenomegaly, not elsewhere classified: Secondary | ICD-10-CM

## 2022-06-17 DIAGNOSIS — D6959 Other secondary thrombocytopenia: Secondary | ICD-10-CM | POA: Insufficient documentation

## 2022-06-17 DIAGNOSIS — Z86000 Personal history of in-situ neoplasm of breast: Secondary | ICD-10-CM | POA: Insufficient documentation

## 2022-06-17 DIAGNOSIS — K7460 Unspecified cirrhosis of liver: Secondary | ICD-10-CM | POA: Diagnosis not present

## 2022-06-17 DIAGNOSIS — I495 Sick sinus syndrome: Secondary | ICD-10-CM | POA: Diagnosis not present

## 2022-06-17 DIAGNOSIS — I4891 Unspecified atrial fibrillation: Secondary | ICD-10-CM | POA: Diagnosis not present

## 2022-06-17 DIAGNOSIS — Z803 Family history of malignant neoplasm of breast: Secondary | ICD-10-CM | POA: Diagnosis not present

## 2022-06-17 DIAGNOSIS — K766 Portal hypertension: Secondary | ICD-10-CM | POA: Diagnosis not present

## 2022-06-17 LAB — CBC WITH DIFFERENTIAL/PLATELET
Abs Immature Granulocytes: 0.01 10*3/uL (ref 0.00–0.07)
Basophils Absolute: 0 10*3/uL (ref 0.0–0.1)
Basophils Relative: 1 %
Eosinophils Absolute: 0.1 10*3/uL (ref 0.0–0.5)
Eosinophils Relative: 3 %
HCT: 32.8 % — ABNORMAL LOW (ref 36.0–46.0)
Hemoglobin: 10.8 g/dL — ABNORMAL LOW (ref 12.0–15.0)
Immature Granulocytes: 1 %
Lymphocytes Relative: 29 %
Lymphs Abs: 0.6 10*3/uL — ABNORMAL LOW (ref 0.7–4.0)
MCH: 30.9 pg (ref 26.0–34.0)
MCHC: 32.9 g/dL (ref 30.0–36.0)
MCV: 94 fL (ref 80.0–100.0)
Monocytes Absolute: 0.2 10*3/uL (ref 0.1–1.0)
Monocytes Relative: 9 %
Neutro Abs: 1.2 10*3/uL — ABNORMAL LOW (ref 1.7–7.7)
Neutrophils Relative %: 57 %
Platelets: 65 10*3/uL — ABNORMAL LOW (ref 150–400)
RBC: 3.49 MIL/uL — ABNORMAL LOW (ref 3.87–5.11)
RDW: 14.1 % (ref 11.5–15.5)
WBC: 2.1 10*3/uL — ABNORMAL LOW (ref 4.0–10.5)
nRBC: 0 % (ref 0.0–0.2)

## 2022-06-17 LAB — COMPREHENSIVE METABOLIC PANEL
ALT: 21 U/L (ref 0–44)
AST: 32 U/L (ref 15–41)
Albumin: 3.8 g/dL (ref 3.5–5.0)
Alkaline Phosphatase: 55 U/L (ref 38–126)
Anion gap: 10 (ref 5–15)
BUN: 16 mg/dL (ref 8–23)
CO2: 28 mmol/L (ref 22–32)
Calcium: 8.9 mg/dL (ref 8.9–10.3)
Chloride: 102 mmol/L (ref 98–111)
Creatinine, Ser: 0.87 mg/dL (ref 0.44–1.00)
GFR, Estimated: >60 mL/min (ref >60)
Glucose, Bld: 193 mg/dL — ABNORMAL HIGH (ref 70–99)
Potassium: 3.4 mmol/L — ABNORMAL LOW (ref 3.5–5.1)
Sodium: 140 mmol/L (ref 135–145)
Total Bilirubin: 0.8 mg/dL (ref 0.3–1.2)
Total Protein: 7.7 g/dL (ref 6.5–8.1)

## 2022-06-17 LAB — FERRITIN: Ferritin: 176 ng/mL (ref 11–307)

## 2022-06-17 LAB — IRON AND TIBC
Iron: 92 ug/dL (ref 28–170)
Saturation Ratios: 26 % (ref 10.4–31.8)
TIBC: 349 ug/dL (ref 250–450)
UIBC: 257 ug/dL

## 2022-06-17 NOTE — Progress Notes (Signed)
Fatigue/weakness: states she is tired a lot Dyspena: no Light headedness: no Blood in stool: no

## 2022-06-17 NOTE — Assessment & Plan Note (Addendum)
#   Anemia-secondary GI bleed chronic/liver disease.  Hemoglobin-10-11.  Iron studies/ferritin -pending. STABLE- Proceed with IV iron infusion.   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  ANC-1200; Platelets 50-60s stable.    # Tachy-brady syndrome- Hx of A.fib [Dr.En; no wtachnam deviced]- no anticoagulation.  stable.    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 No liver mass identified- stable.     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 BIL Mammogram JAN 2023 [Dr.Cintron]-within normal limits.  Not on AI.  Stable.   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   # DISPOSITION:  # recommend referral to Dr.Dew- re  Bilateral varicose vein-  # HOLD Venofer today # follow up in 4 month- MD; labs- cbc/cmp;iron studies;ferritin; AFP-  possible Venofer-Dr.B

## 2022-06-17 NOTE — Progress Notes (Signed)
[place] OFFICE PROGRESS NOTE  Patient Care Team: Albina Billet, MD as PCP - General (Internal Medicine) End, Harrell Gave, MD as PCP - Cardiology (Cardiology) Vickie Epley, MD as PCP - Electrophysiology (Cardiology) Cammie Sickle, MD as Consulting Physician (Internal Medicine)   Cancer Staging  No matching staging information was found for the patient.   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 OCT 2018- BMBx- MILD dyspoiesis; variable cellularity [10-50%]; FISH/Cytogenetics-NORMAL; F-One-NGS-declined by insurance.    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   #Frequent UTIs [2019-2020; Dr. Brandon/uro-gyne,[facility]]; Dr.Wohl/  # HRT [clinical trial thru [facility]; stopped July 2017 ]; CPAP   DIAGNOSIS: Left breast DCIS  STAGE:    0     ;GOALS: cure  CURRENT/MOST RECENT THERAPY: surveillaince    Ductal carcinoma in situ (DCIS) of left breast    INTERVAL HISTORY: Alone.  Ambulating independently.  Annette Foster 82 y.o.  female pleasant patient above history of DCIS; cirrhosis portal hypertension; A. fib and pancytopenia is here for follow-up.  Patient has no concerns. No bleeding. Chronic mild fatigue.   Denies any swelling in the legs.  Denies any shortness of breath or cough.    Review of Systems  Constitutional:  Positive for malaise/fatigue. Negative for chills, diaphoresis, fever and weight loss.  HENT:  Negative for nosebleeds and sore throat.   Eyes:   Negative for double vision.  Respiratory:  Negative for cough, hemoptysis, sputum production, shortness of breath and wheezing.   Cardiovascular:  Negative for chest pain, palpitations, orthopnea and leg swelling.  Gastrointestinal:  Negative for abdominal pain, blood in stool, constipation, diarrhea, heartburn, melena, nausea and vomiting.  Musculoskeletal:  Positive for back pain and joint pain.  Skin: Negative.  Negative for itching and rash.  Neurological:  Negative for dizziness, tingling, focal weakness, weakness and headaches.  Endo/Heme/Allergies:  Does not bruise/bleed easily.  Psychiatric/Behavioral:  Negative for depression. The patient is not nervous/anxious and does not have insomnia.       PAST MEDICAL HISTORY :  Past Medical History:  Diagnosis Date   Abdominal pain    Allergy    Cancer ([place]) 2017   breast cancer- Left   Cataract    CHF (congestive heart failure) (HCC)    Collagenous colitis    Diabetes mellitus without complication (HCC)    Diarrhea    Diverticulosis    Dysrhythmia    Fatty liver disease, nonalcoholic    Fibrocystic breast    GERD (gastroesophageal reflux disease)    Heart murmur    Hyperlipidemia    Hypertension    Hypothyroidism    IBS (irritable bowel syndrome)    IDA (iron deficiency anemia)    Personal history of radiation therapy 2017   LEFT BREAST CA   PONV (postoperative nausea and vomiting)    Presence of permanent cardiac pacemaker    Sleep apnea    C-Pap    PAST SURGICAL HISTORY :   Past Surgical History:  Procedure Laterality Date   ABDOMINAL HYSTERECTOMY     [place] bso   ABDOMINAL SURGERY  APPENDECTOMY     AV NODE ABLATION N/A 05/17/2021   Procedure: AV NODE ABLATION;  Surgeon: Vickie Epley, MD;  Location: [place] [facility];  Service: Cardiovascular;  Laterality: N/A;   BREAST BIOPSY Bilateral 2016   negative   BREAST 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/2015   DCIS, papillary carcinoma in situ   BREAST BIOPSY Left 05/27/2016    BENIGN MAMMARY EPITHELIUM   BREAST BIOPSY Left 11/20/2017   affirm bx x clip BENIGN MAMMARY EPITHELIUM CONSISTENT WITH RAD THERAPY   BREAST EXCISIONAL BIOPSY Right    NEG 1980's   BREAST LUMPECTOMY Left 10/17/2015   DCIS and papillary carcinoma insitu, clear margins   CARDIAC SURGERY     has replacement valve   CATARACT EXTRACTION Right    CATARACT EXTRACTION W/PHACO Left 01/16/2021   Procedure: CATARACT EXTRACTION PHACO AND INTRAOCULAR LENS PLACEMENT (IOC) LEFT DIABETIC 8.46 00:59.8;  Surgeon: Birder Robson, MD;  Location: [place];  Service: Ophthalmology;  Laterality: Left;   CHOLECYSTECTOMY     COLONOSCOPY WITH PROPOFOL N/A 03/11/2016   Procedure: COLONOSCOPY WITH PROPOFOL;  Surgeon: Manya Silvas, MD;  Location: [facility] ENDOSCOPY;  Service: Endoscopy;  Laterality: N/A;   COLONOSCOPY WITH PROPOFOL N/A 02/18/2020   Procedure: COLONOSCOPY WITH PROPOFOL;  Surgeon: [name], MD;  Location[facility] ENDOSCOPY;  Service: Endoscopy;  Laterality: N/A;   ESOPHAGOGASTRODUODENOSCOPY (EGD) WITH PROPOFOL N/A 03/11/2016   Procedure: ESOPHAGOGASTRODUODENOSCOPY (EGD) WITH PROPOFOL;  Surgeon: Manya Silvas, MD;  Location[facility] ENDOSCOPY;  Service: Endoscopy;  Laterality: N/A;   ESOPHAGOGASTRODUODENOSCOPY (EGD) WITH PROPOFOL N/A 02/18/2020   Procedure: ESOPHAGOGASTRODUODENOSCOPY (EGD) WITH PROPOFOL;  Surgeon: [name], MD;  Location: [facility] ENDOSCOPY;  Service: Endoscopy;  Laterality: N/A;   ESOPHAGOGASTRODUODENOSCOPY (EGD) WITH PROPOFOL N/A 04/25/2020   Procedure: ESOPHAGOGASTRODUODENOSCOPY (EGD) WITH PROPOFOL;  Surgeon: [name], MD;  Location: [facility] ENDOSCOPY;  Service: Endoscopy;  Laterality: N/A;   ESOPHAGOGASTRODUODENOSCOPY (EGD) WITH PROPOFOL N/A 05/23/2020   Procedure: ESOPHAGOGASTRODUODENOSCOPY (EGD) WITH PROPOFOL;  Surgeon: [name], MD;  Location: [facility] ENDOSCOPY;  Service: Endoscopy;  Laterality: N/A;   EUS N/A 10/04/2021   Procedure: LOWER ENDOSCOPIC ULTRASOUND  (EUS);  Surgeon: Reita Cliche, MD;  Location: [facility] ENDOSCOPY;  Service: Gastroenterology;  Laterality: N/A;  [facility]   FINGER ARTHROSCOPY WITH CARPOMETACARPEL ([place]) ARTHROPLASTY Right 09/03/2018   Procedure: CARPOMETACARPEL [facility]) ARTHROPLASTY RIGHT THUMB;  Surgeon: Hessie Knows, MD;  Location: [facility] ORS;  Service: Orthopedics;  Laterality: Right;   FLEXIBLE SIGMOIDOSCOPY N/A 09/14/2021   Procedure: FLEXIBLE SIGMOIDOSCOPY;  Surgeon: [name], MD;  Location: [facility] ENDOSCOPY;  Service: Endoscopy;  Laterality: N/A;  No anesthesia   GANGLION CYST EXCISION Right 09/03/2018   Procedure: REMOVAL GANGLION OF WRIST;  Surgeon: Hessie Knows, MD;  Location: [facility] OR[facility];  Service: Orthopedics;  Laterality: Right;   HARDWARE REMOVAL Right 09/03/2018   Procedure: HARDWARE REMOVAL RIGHT THUMB;  Surgeon: Hessie Knows, MD;  Location: [facility];  Service: Orthopedics;  Laterality: Right;  staple removed   JOINT REPLACEMENT Left    TKR   left sinusplasty      MASTECTOMY, PARTIAL Left 10/17/2015   Procedure: MASTECTOMY PARTIAL REVISION;  Surgeon: Leonie Green, MD;  Location: [place] [facility];  Service: General;  Laterality: Left;   PACEMAKER IMPLANT N/A 03/23/2021   Procedure: PACEMAKER IMPLANT;  Surgeon: Vickie Epley, MD;  Location: [place] [facility];  Service: Cardiovascular;  Laterality: N/A;   PARTIAL MASTECTOMY WITH NEEDLE LOCALIZATION Left 09/29/2015   Procedure: PARTIAL MASTECTOMY WITH NEEDLE LOCALIZATION;  Surgeon: Leonie Green,  MD;  Location: [facility];  Service: General;  Laterality: Left;   RECTAL EXAM UNDER ANESTHESIA N/A 10/05/2021   Procedure: RECTAL EXAM UNDER ANESTHESIA, ASPIRATION OF RECTAL CYST;  Surgeon: Carolan Shiver, MD;  Location: [facility] OR[facility];  Service: General;  Laterality: N/A;   TOTAL ABDOMINAL HYSTERECTOMY W/ BILATERAL SALPINGOOPHORECTOMY      FAMILY HISTORY :   Family History  Problem Relation Age of Onset   Breast cancer Paternal Grandmother    Colon cancer Father     Diabetes Sister    Diabetes Brother    Heart disease Brother    Prostate cancer Brother    Colon cancer Maternal Uncle    Prostate cancer Brother    Bladder Cancer Brother    Leukemia Mother        all   Ovarian cancer Neg Hx    Kidney cancer Neg Hx     SOCIAL HISTORY:   Social History   Tobacco Use   Smoking status: Never   Smokeless tobacco: Never  Vaping Use   Vaping Use: Never used  Substance Use Topics   Alcohol use: No    Alcohol/week: 0.0 standard drinks of alcohol   Drug use: No    ALLERGIES:  is allergic to codeine, demeclocycline, demerol [meperidine], hydrocodone, other, oxycodone, pentazocine, tetracyclines & related, coal tar extract, hydrocodone-acetaminophen, salicylic acid, tetracycline hcl, and fentanyl.  MEDICATIONS:  Current Outpatient Medications  Medication Sig Dispense Refill   acetaminophen (TYLENOL) 325 MG tablet Take 1-2 tablets (325-650 mg total) by mouth every 4 (four) hours as needed for mild pain.     Cholecalciferol (VITAMIN D) 50 MCG (2000 UT) CAPS Take 2,000 Units by mouth daily.     clobetasol ointment (TEMOVATE) 0.05 % Apply 1 application topically as needed (vaginal irritation).     clotrimazole (LOTRIMIN) 1 % cream Apply 1 Application topically 2 (two) times daily as needed.     cyanocobalamin (,VITAMIN B-12,) 1000 MCG/ML injection 1,000 mcg every 30 (thirty) days.     dicyclomine (BENTYL) 10 MG capsule Take 10 mg by mouth in the morning, at noon, and at bedtime.     docusate sodium (COLACE) 100 MG capsule Take 100 mg by mouth 3 (three) times daily as needed for moderate constipation.     EPINEPHrine 0.3 mg/0.3 mL IJ SOAJ injection Inject 0.3 mg into the muscle as needed for anaphylaxis.     esomeprazole (NEXIUM) 40 MG capsule Take 40 mg by mouth 2 (two) times daily before a meal.      estradiol (ESTRACE) 0.1 MG/GM vaginal cream Place 1 Applicatorful vaginally daily as needed (irritation).     fenofibrate 160 MG tablet Take 160 mg by  mouth at bedtime.     gabapentin (NEURONTIN) 300 MG capsule Take 300 mg by mouth at bedtime.      hydrOXYzine (ATARAX/VISTARIL) 25 MG tablet Take 25 mg by mouth every 6 (six) hours as needed for anxiety, itching, nausea or vomiting.     Insulin Degludec (TRESIBA) 100 UNIT/ML SOLN Inject 184 Units into the skin at bedtime.     levocetirizine (XYZAL) 5 MG tablet Take 5 mg by mouth every evening.     levothyroxine (SYNTHROID, LEVOTHROID) 75 MCG tablet Take 75 mcg by mouth daily before breakfast.      magnesium oxide (MAG-OX) 400 MG tablet Take 400 mg by mouth 2 (two) times daily.      MOUNJARO 7.5 MG/0.5ML Pen Inject 12.5 mg into the skin once a week.  nadolol (CORGARD) 20 MG tablet Take 1 tablet (20 mg total) by mouth daily. Additional refill available at office vist 90 tablet 0   NONFORMULARY OR COMPOUNDED ITEM Nifedipine 0.3% plus lidocaine 2% cream apply to rectum BID and after every BM.     NOVOLOG FLEXPEN 100 UNIT/ML FlexPen Inject 48-68 Units into the skin 3 (three) times daily with meals. Based on sliding scale     potassium chloride SA (K-DUR,KLOR-CON) 20 MEQ tablet Take 20 mEq by mouth 2 (two) times daily.      saccharomyces boulardii (FLORASTOR) 250 MG capsule Take 250 mg by mouth 2 (two) times daily.      simvastatin (ZOCOR) 20 MG tablet Take 20 mg by mouth at bedtime.     spironolactone (ALDACTONE) 25 MG tablet TAKE 1/2 TABLET BY MOUTH DAILY 45 tablet 3   torsemide (DEMADEX) 20 MG tablet TAKE 1 TABLET BY MOUTH EVERY DAY 90 tablet 0   valACYclovir (VALTREX) 500 MG tablet Take 500 mg by mouth 2 (two) times daily as needed (fever blisters).  1   No current facility-administered medications for this visit.    PHYSICAL EXAMINATION: ECOG PERFORMANCE STATUS: 1 - Symptomatic but completely ambulatory  BP 115/73 (BP Location: Right Arm, Patient Position: Sitting, Cuff Size: Normal)   Pulse 94   Temp (!) 96.1 F (35.6 C) (Tympanic)   Ht 5\' 5"  (1.651 m)   Wt 140 lb 6.4 oz (63.7 kg)    LMP  (LMP Unknown)   SpO2 100%   BMI 23.36 kg/m   Filed Weights   06/17/22 1258  Weight: 140 lb 6.4 oz (63.7 kg)    Physical Exam Constitutional:      Comments: Alone.  Ambulating independently.  HENT:     Head: Normocephalic and atraumatic.     Mouth/Throat:     Pharynx: No oropharyngeal exudate.  Eyes:     Pupils: Pupils are equal, round, and reactive to light.  Cardiovascular:     Rate and Rhythm: Tachycardia present. Rhythm irregular.  Pulmonary:     Effort: Pulmonary effort is normal. No respiratory distress.     Breath sounds: Normal breath sounds. No wheezing.  Abdominal:     General: Bowel sounds are normal. There is no distension.     Palpations: Abdomen is soft. There is no mass.     Tenderness: There is no abdominal tenderness. There is no guarding or rebound.     Comments: Positive for splenomegaly.  Musculoskeletal:        General: No tenderness. Normal range of motion.     Cervical back: Normal range of motion and neck supple.  Skin:    General: Skin is warm.  Neurological:     Mental Status: She is alert and oriented to person, place, and time.  Psychiatric:        Mood and Affect: Affect normal.      LABORATORY DATA:  I have reviewed the data as listed    Component Value Date/Time   NA 140 06/17/2022 1246   NA 137 04/26/2021 1439   K 3.4 (L) 06/17/2022 1246   CL 102 06/17/2022 1246   CO2 28 06/17/2022 1246   GLUCOSE 193 (H) 06/17/2022 1246   BUN 16 06/17/2022 1246   BUN 19 04/26/2021 1439   CREATININE 0.87 06/17/2022 1246   CALCIUM 8.9 06/17/2022 1246   PROT 7.7 06/17/2022 1246   PROT 7.1 11/30/2020 0944   ALBUMIN 3.8 06/17/2022 1246   ALBUMIN 3.8 11/30/2020 0944  AST 32 06/17/2022 1246   ALT 21 06/17/2022 1246   ALKPHOS 55 06/17/2022 1246   BILITOT 0.8 06/17/2022 1246   BILITOT 0.8 11/30/2020 0944   GFRNONAA >60 06/17/2022 1246   GFRAA 80 02/25/2020 1105    No results found for: "SPEP", "UPEP"  Lab Results  Component Value Date    WBC 2.1 (L) 06/17/2022   NEUTROABS 1.2 (L) 06/17/2022   HGB 10.8 (L) 06/17/2022   HCT 32.8 (L) 06/17/2022   MCV 94.0 06/17/2022   PLT 65 (L) 06/17/2022      Chemistry      Component Value Date/Time   NA 140 06/17/2022 1246   NA 137 04/26/2021 1439   K 3.4 (L) 06/17/2022 1246   CL 102 06/17/2022 1246   CO2 28 06/17/2022 1246   BUN 16 06/17/2022 1246   BUN 19 04/26/2021 1439   CREATININE 0.87 06/17/2022 1246      Component Value Date/Time   CALCIUM 8.9 06/17/2022 1246   ALKPHOS 55 06/17/2022 1246   AST 32 06/17/2022 1246   ALT 21 06/17/2022 1246   BILITOT 0.8 06/17/2022 1246   BILITOT 0.8 11/30/2020 0944       RADIOGRAPHIC STUDIES: I have personally reviewed the radiological images as listed and agreed with the findings in the report. No results found.   ASSESSMENT & PLAN:  Other pancytopenia (HCC) # Anemia-secondary GI bleed chronic/liver disease.  Hemoglobin-10-11.  Iron studies/ferritin -pending. STABLE- Proceed with IV iron infusion.   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  ANC-1200; Platelets 50-60s stable.    # Tachy-brady syndrome- Hx of A.fib [Dr.En; no wtachnam deviced]- no anticoagulation.  stable.    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 No liver mass identified- stable.     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 BIL Mammogram JAN 2023 [Dr.Cintron]-within normal limits.  Not on AI.  Stable.   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   # DISPOSITION:  # recommend referral to Dr.Dew- re  Bilateral varicose vein-  # HOLD Venofer today # follow up in 4 month- MD; labs- cbc/cmp;iron studies;ferritin; AFP-  possible Venofer-Dr.B    No orders of the defined  types were placed in this encounter.  All questions were answered. The patient knows to call the clinic with any problems, questions or concerns.      Earna Coder, MD 06/17/2022 1:24 PM

## 2022-06-19 LAB — AFP TUMOR MARKER: AFP, Serum, Tumor Marker: 3.7 ng/mL (ref 0.0–8.7)

## 2022-06-21 ENCOUNTER — Ambulatory Visit (INDEPENDENT_AMBULATORY_CARE_PROVIDER_SITE_OTHER): Payer: Medicare PPO

## 2022-06-21 DIAGNOSIS — I495 Sick sinus syndrome: Secondary | ICD-10-CM | POA: Diagnosis not present

## 2022-06-21 LAB — CUP PACEART REMOTE DEVICE CHECK
Battery Voltage: 90
Date Time Interrogation Session: 20240510064323
Implantable Lead Connection Status: 753985
Implantable Lead Connection Status: 753985
Implantable Lead Implant Date: 20230210
Implantable Lead Implant Date: 20230210
Implantable Lead Location: 753859
Implantable Lead Location: 753860
Implantable Lead Model: 377169
Implantable Lead Model: 377171
Implantable Lead Serial Number: 7000400966
Implantable Lead Serial Number: 7000402075
Implantable Pulse Generator Implant Date: 20230210
Pulse Gen Model: 407145
Pulse Gen Serial Number: 70331369

## 2022-07-09 NOTE — Progress Notes (Signed)
Remote pacemaker transmission.   

## 2022-07-16 ENCOUNTER — Other Ambulatory Visit: Payer: Self-pay

## 2022-07-16 DIAGNOSIS — I83893 Varicose veins of bilateral lower extremities with other complications: Secondary | ICD-10-CM

## 2022-07-17 DIAGNOSIS — I8310 Varicose veins of unspecified lower extremity with inflammation: Secondary | ICD-10-CM | POA: Insufficient documentation

## 2022-07-17 NOTE — Progress Notes (Unsigned)
MRN : [id number]  Annette Foster is a 82 y.o. (February 23, 1940) female who presents with chief complaint of varicose veins hurt.  History of Present Illness:  The patient presents today for evaluation of leg cramps.  She notes that the cramping affects both lower extremities about the same.  She describes it as beginning from the groins and radiating down both legs.  She states that it seems to be the worst at night waking her up at 2:30 in the morning.  It seems to wax and wane with time but lately has been quite severe.  It does not seem to be made worse with walking.  She also notes she has some varicose veins predominantly of the right lower extremity.  But some scattered on the left as well.  No history of lower back problems or degenerative disc disease of the lumbar spine.  There is no history of DVT, PE or superficial thrombophlebitis. There is no history of ulceration or hemorrhage. The patient denies a significant family history of varicose veins.  The patient has not worn graduated compression in the past. At the present time the patient has not been using over-the-counter analgesics. There is no history of prior surgical intervention or sclerotherapy.  Duplex ultrasound of the venous system obtained today demonstrates normal deep venous system.  There is superficial reflux on the right from the distal thigh to the calf within the great saphenous vein.  On the left there is no superficial reflux.   No outpatient medications have been marked as taking for the 07/18/22 encounter (Appointment) with Gilda Crease, Latina Craver, MD.    Past Medical History:  Diagnosis Date   Abdominal pain    Allergy    Cancer (HCC) 2017   breast cancer- Left   Cataract    CHF (congestive heart failure) (HCC)    Collagenous colitis    Diabetes mellitus without complication (HCC)    Diarrhea    Diverticulosis    Dysrhythmia    Fatty liver disease, nonalcoholic    Fibrocystic breast     GERD (gastroesophageal reflux disease)    Heart murmur    Hyperlipidemia    Hypertension    Hypothyroidism    IBS (irritable bowel syndrome)    IDA (iron deficiency anemia)    Personal history of radiation therapy 2017   LEFT BREAST CA   PONV (postoperative nausea and vomiting)    Presence of permanent cardiac pacemaker    Sleep apnea    C-Pap    Past Surgical History:  Procedure Laterality Date   ABDOMINAL HYSTERECTOMY     [place] bso   ABDOMINAL SURGERY     APPENDECTOMY     AV NODE ABLATION N/A 05/17/2021   Procedure: AV NODE ABLATION;  Surgeon: Lanier Prude, MD;  Location: [facility];  Service: Cardiovascular;  Laterality: N/A;   BREAST BIOPSY Bilateral 2016   negative   BREAST 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/2015   DCIS, papillary carcinoma in situ   BREAST BIOPSY Left 05/27/2016   BENIGN MAMMARY EPITHELIUM   BREAST BIOPSY Left 11/20/2017   affirm bx x clip BENIGN MAMMARY EPITHELIUM CONSISTENT WITH RAD THERAPY   BREAST EXCISIONAL BIOPSY Right    NEG 1980's   BREAST LUMPECTOMY Left 10/17/2015   DCIS and papillary carcinoma insitu, clear margins   CARDIAC SURGERY     has replacement valve   CATARACT EXTRACTION Right  CATARACT EXTRACTION W/PHACO Left 01/16/2021   Procedure: CATARACT EXTRACTION PHACO AND INTRAOCULAR LENS PLACEMENT (IOC) LEFT DIABETIC 8.46 00:59.8;  Surgeon: Galen Manila, MD;  Location: [facility];  Service: Ophthalmology;  Laterality: Left;   CHOLECYSTECTOMY     COLONOSCOPY WITH PROPOFOL N/A 03/11/2016   Procedure: COLONOSCOPY WITH PROPOFOL;  Surgeon: Scot Jun, MD;  Location: [facility];  Service: Endoscopy;  Laterality: N/A;   COLONOSCOPY WITH PROPOFOL N/A 02/18/2020   Procedure: COLONOSCOPY WITH PROPOFOL;  Surgeon: Midge Minium, MD;  Location: [facility] ENDOSCOPY;  Service: Endoscopy;  Laterality: N/A;   ESOPHAGOGASTRODUODENOSCOPY (EGD) WITH PROPOFOL N/A 03/11/2016   Procedure: ESOPHAGOGASTRODUODENOSCOPY (EGD) WITH PROPOFOL;  Surgeon:  Scot Jun, MD;  Location: [facility] ENDOSCOPY;  Service: Endoscopy;  Laterality: N/A;   ESOPHAGOGASTRODUODENOSCOPY (EGD) WITH PROPOFOL N/A 02/18/2020   Procedure: ESOPHAGOGASTRODUODENOSCOPY (EGD) WITH PROPOFOL;  Surgeon: Midge Minium, MD;  Location: [facility] ENDOSCOPY;  Service: Endoscopy;  Laterality: N/A;   ESOPHAGOGASTRODUODENOSCOPY (EGD) WITH PROPOFOL N/A 04/25/2020   Procedure: ESOPHAGOGASTRODUODENOSCOPY (EGD) WITH PROPOFOL;  Surgeon: Midge Minium, MD;  Location: [facility] ENDOSCOPY;  Service: Endoscopy;  Laterality: N/A;   ESOPHAGOGASTRODUODENOSCOPY (EGD) WITH PROPOFOL N/A 05/23/2020   Procedure: ESOPHAGOGASTRODUODENOSCOPY (EGD) WITH PROPOFOL;  Surgeon: Midge Minium, MD;  Location: [facility] ENDOSCOPY;  Service: Endoscopy;  Laterality: N/A;   EUS N/A 10/04/2021   Procedure: LOWER ENDOSCOPIC ULTRASOUND (EUS);  Surgeon: Doren Custard, MD;  Location: [facility] ENDOSCOPY;  Service: Gastroenterology;  Laterality: N/A;  [facility]   FINGER ARTHROSCOPY WITH CARPOMETACARPEL (CMC) ARTHROPLASTY Right 09/03/2018   Procedure: CARPOMETACARPEL [facility]) ARTHROPLASTY RIGHT THUMB;  Surgeon: Kennedy Bucker, MD;  Location: [facility] ORS;  Service: Orthopedics;  Laterality: Right;   FLEXIBLE SIGMOIDOSCOPY N/A 09/14/2021   Procedure: FLEXIBLE SIGMOIDOSCOPY;  Surgeon: Midge Minium, MD;  Location: [facility] ENDOSCOPY;  Service: Endoscopy;  Laterality: N/A;  No anesthesia   GANGLION CYST EXCISION Right 09/03/2018   Procedure: REMOVAL GANGLION OF WRIST;  Surgeon: Kennedy Bucker, MD;  Location: [facility];  Service: Orthopedics;  Laterality: Right;   HARDWARE REMOVAL Right 09/03/2018   Procedure: HARDWARE REMOVAL RIGHT THUMB;  Surgeon: Kennedy Bucker, MD;  Location: [facility];  Service: Orthopedics;  Laterality: Right;  staple removed   JOINT REPLACEMENT Left    TKR   left sinusplasty      MASTECTOMY, PARTIAL Left 10/17/2015   Procedure: MASTECTOMY PARTIAL REVISION;  Surgeon: Nadeen Landau, MD;  Location: [facility] ORS;  Service: General;  Laterality:  Left;   PACEMAKER IMPLANT N/A 03/23/2021   Procedure: PACEMAKER IMPLANT;  Surgeon: Lanier Prude, MD;  Location: [facility];  Service: Cardiovascular;  Laterality: N/A;   PARTIAL MASTECTOMY WITH NEEDLE LOCALIZATION Left 09/29/2015   Procedure: PARTIAL MASTECTOMY WITH NEEDLE LOCALIZATION;  Surgeon: Nadeen Landau, MD;  Location: [facility];  Service: General;  Laterality: Left;   RECTAL EXAM UNDER ANESTHESIA N/A 10/05/2021   Procedure: RECTAL EXAM UNDER ANESTHESIA, ASPIRATION OF RECTAL CYST;  Surgeon: Carolan Shiver, MD;  Location: [facility];  Service: General;  Laterality: N/A;   TOTAL ABDOMINAL HYSTERECTOMY W/ BILATERAL SALPINGOOPHORECTOMY      Social History Social History   Tobacco Use   Smoking status: Never   Smokeless tobacco: Never  Vaping Use   Vaping Use: Never used  Substance Use Topics   Alcohol use: No    Alcohol/week: 0.0 standard drinks of alcohol   Drug use: No    Family History Family History  Problem Relation Age of Onset   Breast cancer Paternal Grandmother    Colon  cancer Father    Diabetes Sister    Diabetes Brother    Heart disease Brother    Prostate cancer Brother    Colon cancer Maternal Uncle    Prostate cancer Brother    Bladder Cancer Brother    Leukemia Mother        all   Ovarian cancer Neg Hx    Kidney cancer Neg Hx     Allergies  Allergen Reactions   Codeine Itching, Nausea And Vomiting and Other (See Comments)   Demeclocycline Rash   Demerol [Meperidine] Itching and Nausea And Vomiting   Hydrocodone Itching and Nausea And Vomiting   Other Other (See Comments)   Oxycodone Itching and Nausea And Vomiting   Pentazocine Itching, Nausea And Vomiting and Other (See Comments)   Tetracyclines & Related Rash   Coal Tar Extract Other (See Comments)   Hydrocodone-Acetaminophen Other (See Comments)   Salicylic Acid Other (See Comments)   Tetracycline Hcl Other (See Comments)   Fentanyl Itching, Nausea And Vomiting and  Other (See Comments)    D/W surgical nurse on 7/23 and pt reportedly tolerated dose during surgery.  Dose was given by MD.  Pt reported that she only gets itching and did not object to receiving med     REVIEW OF SYSTEMS (Negative unless checked)  Constitutional: [] Weight loss  [] Fever  [] Chills Cardiac: [] Chest pain   [] Chest pressure   [] Palpitations   [] Shortness of breath when laying flat   [] Shortness of breath with exertion. Vascular:  [] Pain in legs with walking   [x] Pain in legs with standing  [] History of DVT   [] Phlebitis   [] Swelling in legs   [x] Varicose veins   [] Non-healing ulcers Pulmonary:   [] Uses home oxygen   [] Productive cough   [] Hemoptysis   [] Wheeze  [] COPD   [] Asthma Neurologic:  [] Dizziness   [] Seizures   [] History of stroke   [] History of TIA  [] Aphasia   [] Vissual changes   [] Weakness or numbness in arm   [] Weakness or numbness in leg Musculoskeletal:   [] Joint swelling   [] Joint pain   [] Low back pain Hematologic:  [] Easy bruising  [] Easy bleeding   [] Hypercoagulable state   [] Anemic Gastrointestinal:  [] Diarrhea   [] Vomiting  [x] Gastroesophageal reflux/heartburn   [] Difficulty swallowing. Genitourinary:  [] Chronic kidney disease   [] Difficult urination  [] Frequent urination   [] Blood in urine Skin:  [] Rashes   [] Ulcers  Psychological:  [] History of anxiety   []  History of major depression.  Physical Examination  There were no vitals filed for this visit. There is no height or weight on file to calculate BMI. Gen: WD/WN, NAD Head: [place]/AT, No temporalis wasting.  Ear/Nose/Throat: Hearing grossly intact, nares w/o erythema or drainage, pinna without lesions Eyes: PER, EOMI, sclera nonicteric.  Neck: Supple, no gross masses.  No JVD.  Pulmonary:  Good air movement, no audible wheezing, no use of accessory muscles.  Cardiac: RRR, precordium not hyperdynamic. Vascular:  Varicosities present right lower extremity.  Veins are tender to palpation  Mild venous stasis  changes to the legs bilaterally.  Trace soft pitting edema CEAP C3sEpAsPr Vessel Right Left  Radial Palpable Palpable  Gastrointestinal: soft, non-distended. No guarding/no peritoneal signs.  Musculoskeletal: M/S 5/5 throughout.  No deformity.  Neurologic: CN 2-12 intact. Pain and light touch intact in extremities.  Symmetrical.  Speech is fluent. Motor exam as listed above. Psychiatric: Judgment intact, Mood & affect appropriate for pt's clinical situation. Dermatologic: Venous rashes no ulcers noted.  No  changes consistent with cellulitis. Lymph : No lichenification or skin changes of chronic lymphedema.  CBC Lab Results  Component Value Date   WBC 2.1 (L) 06/17/2022   HGB 10.8 (L) 06/17/2022   HCT 32.8 (L) 06/17/2022   MCV 94.0 06/17/2022   PLT 65 (L) 06/17/2022    BMET    Component Value Date/Time   NA 140 06/17/2022 1246   NA 137 04/26/2021 1439   K 3.4 (L) 06/17/2022 1246   CL 102 06/17/2022 1246   CO2 28 06/17/2022 1246   GLUCOSE 193 (H) 06/17/2022 1246   BUN 16 06/17/2022 1246   BUN 19 04/26/2021 1439   CREATININE 0.87 06/17/2022 1246   CALCIUM 8.9 06/17/2022 1246   GFRNONAA >60 06/17/2022 1246   GFRAA 80 02/25/2020 1105   CrCl cannot be calculated (Patient's most recent lab result is older than the maximum 21 days allowed.).  COAG Lab Results  Component Value Date   INR 1.3 (H) 02/16/2020   INR 1.3 (H) 02/07/2020   INR 1.06 11/04/2016    Radiology CUP PACEART REMOTE DEVICE CHECK  Result Date: 06/21/2022 Scheduled remote reviewed. Normal device function.  Next remote 91 days. LA, CVRS    Assessment/Plan 1. Pain in both lower extremities Recommend:  The patient has atypical pain symptoms for vascular disease and on exam I do not find evidence of vascular pathology that would explain the patient's symptoms.  Noninvasive studies do not identify significant vascular problems  I suspect the patient is c/o pain in the lower extremities related to lumbar  sacral degenerative disease.  Patient should have an evaluation of the LS spine which I defer to the primary service or the Spine service.  The patient should continue walking and begin a more formal exercise program. The patient should continue his antiplatelet therapy and aggressive treatment of the lipid abnormalities.  Patient will follow-up with me on a PRN basis.  2. Varicose veins with inflammation Recommend:  The patient notes varicose veins predominantly of the right lower extremity.  Given that her symptoms occur in the middle of the night as well as the fact that she is having leg pain that is relatively equal bilaterally and yet her reflux is only on the right I do not believe that her varicose veins are the etiology of her leg cramps.  At this time the patient wishes to continue conservative therapy and is not interested in more invasive treatments such as laser ablation and sclerotherapy.  The Patient will follow up PRN if the symptoms worsen.  3. Primary hypertension Continue antihypertensive medications as already ordered, these medications have been reviewed and there are no changes at this time.  4. Atrial fibrillation with RVR (HCC) Continue antiarrhythmia medications as already ordered, these medications have been reviewed and there are no changes at this time.  Continue anticoagulation as ordered by Cardiology Service  5. Type 2 diabetes mellitus with other specified complication, unspecified whether long term insulin use (HCC) Continue hypoglycemic medications as already ordered, these medications have been reviewed and there are no changes at this time.  Hgb A1C to be monitored as already arranged by primary service     Levora Dredge, MD  07/17/2022 11:34 AM

## 2022-07-18 ENCOUNTER — Encounter (INDEPENDENT_AMBULATORY_CARE_PROVIDER_SITE_OTHER): Payer: Self-pay | Admitting: Vascular Surgery

## 2022-07-18 ENCOUNTER — Ambulatory Visit (INDEPENDENT_AMBULATORY_CARE_PROVIDER_SITE_OTHER): Payer: Medicare PPO

## 2022-07-18 ENCOUNTER — Ambulatory Visit (INDEPENDENT_AMBULATORY_CARE_PROVIDER_SITE_OTHER): Payer: Medicare PPO | Admitting: Vascular Surgery

## 2022-07-18 VITALS — BP 127/60 | HR 65 | Resp 17 | Ht 63.0 in | Wt 143.2 lb

## 2022-07-18 DIAGNOSIS — I8310 Varicose veins of unspecified lower extremity with inflammation: Secondary | ICD-10-CM | POA: Diagnosis not present

## 2022-07-18 DIAGNOSIS — M79606 Pain in leg, unspecified: Secondary | ICD-10-CM | POA: Insufficient documentation

## 2022-07-18 DIAGNOSIS — I4891 Unspecified atrial fibrillation: Secondary | ICD-10-CM

## 2022-07-18 DIAGNOSIS — M79605 Pain in left leg: Secondary | ICD-10-CM

## 2022-07-18 DIAGNOSIS — M79604 Pain in right leg: Secondary | ICD-10-CM

## 2022-07-18 DIAGNOSIS — E1169 Type 2 diabetes mellitus with other specified complication: Secondary | ICD-10-CM

## 2022-07-18 DIAGNOSIS — I83893 Varicose veins of bilateral lower extremities with other complications: Secondary | ICD-10-CM | POA: Diagnosis not present

## 2022-07-18 DIAGNOSIS — I10 Essential (primary) hypertension: Secondary | ICD-10-CM

## 2022-07-28 ENCOUNTER — Other Ambulatory Visit: Payer: Self-pay | Admitting: Internal Medicine

## 2022-08-14 ENCOUNTER — Other Ambulatory Visit: Payer: Self-pay | Admitting: Internal Medicine

## 2022-09-05 ENCOUNTER — Encounter: Payer: Self-pay | Admitting: Medical

## 2022-09-08 ENCOUNTER — Encounter: Payer: Self-pay | Admitting: Internal Medicine

## 2022-09-08 ENCOUNTER — Other Ambulatory Visit: Payer: Self-pay

## 2022-09-08 ENCOUNTER — Inpatient Hospital Stay
Admission: EM | Admit: 2022-09-08 | Discharge: 2022-09-13 | DRG: 432 | Disposition: A | Discharge status: Home Health | Payer: Medicare PPO | Attending: Internal Medicine | Admitting: Internal Medicine

## 2022-09-08 ENCOUNTER — Emergency Department: Payer: Medicare PPO

## 2022-09-08 DIAGNOSIS — Z803 Family history of malignant neoplasm of breast: Secondary | ICD-10-CM

## 2022-09-08 DIAGNOSIS — I8511 Secondary esophageal varices with bleeding: Secondary | ICD-10-CM | POA: Diagnosis present

## 2022-09-08 DIAGNOSIS — D61818 Other pancytopenia: Secondary | ICD-10-CM | POA: Diagnosis present

## 2022-09-08 DIAGNOSIS — D124 Benign neoplasm of descending colon: Secondary | ICD-10-CM | POA: Diagnosis present

## 2022-09-08 DIAGNOSIS — Z794 Long term (current) use of insulin: Secondary | ICD-10-CM

## 2022-09-08 DIAGNOSIS — J690 Pneumonitis due to inhalation of food and vomit: Secondary | ICD-10-CM | POA: Diagnosis present

## 2022-09-08 DIAGNOSIS — Z833 Family history of diabetes mellitus: Secondary | ICD-10-CM

## 2022-09-08 DIAGNOSIS — K7581 Nonalcoholic steatohepatitis (NASH): Secondary | ICD-10-CM | POA: Diagnosis present

## 2022-09-08 DIAGNOSIS — D122 Benign neoplasm of ascending colon: Secondary | ICD-10-CM | POA: Diagnosis present

## 2022-09-08 DIAGNOSIS — K921 Melena: Secondary | ICD-10-CM | POA: Diagnosis not present

## 2022-09-08 DIAGNOSIS — Z881 Allergy status to other antibiotic agents status: Secondary | ICD-10-CM

## 2022-09-08 DIAGNOSIS — K219 Gastro-esophageal reflux disease without esophagitis: Secondary | ICD-10-CM | POA: Diagnosis present

## 2022-09-08 DIAGNOSIS — K7460 Unspecified cirrhosis of liver: Secondary | ICD-10-CM | POA: Diagnosis not present

## 2022-09-08 DIAGNOSIS — Z8052 Family history of malignant neoplasm of bladder: Secondary | ICD-10-CM

## 2022-09-08 DIAGNOSIS — I5032 Chronic diastolic (congestive) heart failure: Secondary | ICD-10-CM | POA: Diagnosis present

## 2022-09-08 DIAGNOSIS — Z950 Presence of cardiac pacemaker: Secondary | ICD-10-CM | POA: Diagnosis present

## 2022-09-08 DIAGNOSIS — R188 Other ascites: Secondary | ICD-10-CM | POA: Diagnosis present

## 2022-09-08 DIAGNOSIS — I10 Essential (primary) hypertension: Secondary | ICD-10-CM | POA: Diagnosis present

## 2022-09-08 DIAGNOSIS — Z79890 Hormone replacement therapy (postmenopausal): Secondary | ICD-10-CM

## 2022-09-08 DIAGNOSIS — Z923 Personal history of irradiation: Secondary | ICD-10-CM

## 2022-09-08 DIAGNOSIS — E119 Type 2 diabetes mellitus without complications: Secondary | ICD-10-CM

## 2022-09-08 DIAGNOSIS — J189 Pneumonia, unspecified organism: Secondary | ICD-10-CM | POA: Diagnosis present

## 2022-09-08 DIAGNOSIS — K922 Gastrointestinal hemorrhage, unspecified: Secondary | ICD-10-CM | POA: Diagnosis not present

## 2022-09-08 DIAGNOSIS — Z9049 Acquired absence of other specified parts of digestive tract: Secondary | ICD-10-CM

## 2022-09-08 DIAGNOSIS — K625 Hemorrhage of anus and rectum: Secondary | ICD-10-CM | POA: Diagnosis present

## 2022-09-08 DIAGNOSIS — G9341 Metabolic encephalopathy: Secondary | ICD-10-CM | POA: Diagnosis present

## 2022-09-08 DIAGNOSIS — Z8042 Family history of malignant neoplasm of prostate: Secondary | ICD-10-CM

## 2022-09-08 DIAGNOSIS — Z800 Family history of malignant neoplasm of digestive organs: Secondary | ICD-10-CM

## 2022-09-08 DIAGNOSIS — Z888 Allergy status to other drugs, medicaments and biological substances status: Secondary | ICD-10-CM

## 2022-09-08 DIAGNOSIS — Z66 Do not resuscitate: Secondary | ICD-10-CM | POA: Diagnosis present

## 2022-09-08 DIAGNOSIS — Z806 Family history of leukemia: Secondary | ICD-10-CM

## 2022-09-08 DIAGNOSIS — I851 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 bleeding: Secondary | ICD-10-CM | POA: Diagnosis present

## 2022-09-08 DIAGNOSIS — Z79899 Other long term (current) drug therapy: Secondary | ICD-10-CM

## 2022-09-08 DIAGNOSIS — D126 Benign neoplasm of colon, unspecified: Secondary | ICD-10-CM

## 2022-09-08 DIAGNOSIS — I110 Hypertensive heart disease with heart failure: Secondary | ICD-10-CM | POA: Diagnosis present

## 2022-09-08 DIAGNOSIS — I4891 Unspecified atrial fibrillation: Secondary | ICD-10-CM | POA: Diagnosis present

## 2022-09-08 DIAGNOSIS — Z8249 Family history of ischemic heart disease and other diseases of the circulatory system: Secondary | ICD-10-CM

## 2022-09-08 DIAGNOSIS — Z7985 Long-term (current) use of injectable non-insulin antidiabetic drugs: Secondary | ICD-10-CM

## 2022-09-08 DIAGNOSIS — E039 Hypothyroidism, unspecified: Secondary | ICD-10-CM | POA: Diagnosis present

## 2022-09-08 DIAGNOSIS — K766 Portal hypertension: Secondary | ICD-10-CM | POA: Diagnosis present

## 2022-09-08 DIAGNOSIS — Z853 Personal history of malignant neoplasm of breast: Secondary | ICD-10-CM

## 2022-09-08 DIAGNOSIS — E611 Iron deficiency: Secondary | ICD-10-CM | POA: Diagnosis present

## 2022-09-08 DIAGNOSIS — E11649 Type 2 diabetes mellitus with hypoglycemia without coma: Secondary | ICD-10-CM | POA: Diagnosis not present

## 2022-09-08 DIAGNOSIS — E785 Hyperlipidemia, unspecified: Secondary | ICD-10-CM | POA: Diagnosis present

## 2022-09-08 DIAGNOSIS — Z885 Allergy status to narcotic agent status: Secondary | ICD-10-CM

## 2022-09-08 LAB — COMPREHENSIVE METABOLIC PANEL
ALT: 16 U/L (ref 0–44)
AST: 24 U/L (ref 15–41)
Albumin: 3.3 g/dL — ABNORMAL LOW (ref 3.5–5.0)
Alkaline Phosphatase: 60 U/L (ref 38–126)
Anion gap: 9 (ref 5–15)
BUN: 13 mg/dL (ref 8–23)
CO2: 27 mmol/L (ref 22–32)
Calcium: 8.7 mg/dL — ABNORMAL LOW (ref 8.9–10.3)
Chloride: 102 mmol/L (ref 98–111)
Creatinine, Ser: 1.02 mg/dL — ABNORMAL HIGH (ref 0.44–1.00)
GFR, Estimated: 55 mL/min — ABNORMAL LOW (ref >60)
Glucose, Bld: 251 mg/dL — ABNORMAL HIGH (ref 70–99)
Potassium: 4.3 mmol/L (ref 3.5–5.1)
Sodium: 138 mmol/L (ref 135–145)
Total Bilirubin: 0.6 mg/dL (ref 0.3–1.2)
Total Protein: 8.2 g/dL — ABNORMAL HIGH (ref 6.5–8.1)

## 2022-09-08 LAB — CBC
HCT: 28.4 % — ABNORMAL LOW (ref 36.0–46.0)
Hemoglobin: 9.2 g/dL — ABNORMAL LOW (ref 12.0–15.0)
MCH: 30.5 pg (ref 26.0–34.0)
MCHC: 32.4 g/dL (ref 30.0–36.0)
MCV: 94 fL (ref 80.0–100.0)
Platelets: 84 10*3/uL — ABNORMAL LOW (ref 150–400)
RBC: 3.02 MIL/uL — ABNORMAL LOW (ref 3.87–5.11)
RDW: 13.7 % (ref 11.5–15.5)
WBC: 2.4 10*3/uL — ABNORMAL LOW (ref 4.0–10.5)
nRBC: 0 % (ref 0.0–0.2)

## 2022-09-08 LAB — TYPE AND SCREEN
ABO/RH(D): A POS
Antibody Screen: NEGATIVE

## 2022-09-08 MED ORDER — LEVOTHYROXINE SODIUM 50 MCG PO TABS
75.0000 ug | ORAL_TABLET | Freq: Every day | ORAL | Status: DC
  Administered 2022-09-09 – 2022-09-13 (×5): 75 ug via ORAL
  Filled 2022-09-08 (×5): qty 1

## 2022-09-08 MED ORDER — GABAPENTIN 300 MG PO CAPS
600.0000 mg | ORAL_CAPSULE | Freq: Every day | ORAL | Status: DC
  Administered 2022-09-09 – 2022-09-12 (×5): 600 mg via ORAL
  Filled 2022-09-08 (×5): qty 2

## 2022-09-08 MED ORDER — SODIUM CHLORIDE 0.9% FLUSH
3.0000 mL | Freq: Two times a day (BID) | INTRAVENOUS | Status: DC
  Administered 2022-09-09 – 2022-09-13 (×8): 3 mL via INTRAVENOUS

## 2022-09-08 MED ORDER — DICYCLOMINE HCL 10 MG PO CAPS
10.0000 mg | ORAL_CAPSULE | Freq: Three times a day (TID) | ORAL | Status: DC
  Administered 2022-09-09 – 2022-09-13 (×16): 10 mg via ORAL
  Filled 2022-09-08 (×20): qty 1

## 2022-09-08 MED ORDER — INSULIN GLARGINE-YFGN 100 UNIT/ML ~~LOC~~ SOLN
80.0000 [IU] | Freq: Every day | SUBCUTANEOUS | Status: DC
  Administered 2022-09-09: 80 [IU] via SUBCUTANEOUS
  Filled 2022-09-08: qty 0.8

## 2022-09-08 MED ORDER — ONDANSETRON HCL 4 MG/2ML IJ SOLN: 4.0000 mg | Freq: Four times a day (QID) | INTRAMUSCULAR | Status: DC | PRN

## 2022-09-08 MED ORDER — PANTOPRAZOLE SODIUM 40 MG PO TBEC
80.0000 mg | DELAYED_RELEASE_TABLET | Freq: Every day | ORAL | Status: DC
  Administered 2022-09-09 – 2022-09-13 (×4): 80 mg via ORAL
  Filled 2022-09-08 (×5): qty 2

## 2022-09-08 MED ORDER — FENOFIBRATE 160 MG PO TABS
160.0000 mg | ORAL_TABLET | Freq: Every day | ORAL | Status: DC
  Administered 2022-09-09 – 2022-09-12 (×4): 160 mg via ORAL
  Filled 2022-09-08 (×4): qty 1

## 2022-09-08 MED ORDER — NADOLOL 20 MG PO TABS
20.0000 mg | ORAL_TABLET | Freq: Every day | ORAL | Status: DC
  Administered 2022-09-09 – 2022-09-13 (×5): 20 mg via ORAL
  Filled 2022-09-08 (×5): qty 1

## 2022-09-08 MED ORDER — TORSEMIDE 20 MG PO TABS
20.0000 mg | ORAL_TABLET | Freq: Every day | ORAL | Status: DC
  Administered 2022-09-09 – 2022-09-13 (×5): 20 mg via ORAL
  Filled 2022-09-08 (×5): qty 1

## 2022-09-08 MED ORDER — SIMVASTATIN 20 MG PO TABS
20.0000 mg | ORAL_TABLET | Freq: Every day | ORAL | Status: DC
  Administered 2022-09-09 – 2022-09-12 (×5): 20 mg via ORAL
  Filled 2022-09-08 (×5): qty 1

## 2022-09-08 MED ORDER — ACETAMINOPHEN 325 MG PO TABS: 325.0000 mg | ORAL_TABLET | ORAL | Status: DC | PRN

## 2022-09-08 MED ORDER — LORATADINE 10 MG PO TABS
10.0000 mg | ORAL_TABLET | Freq: Every evening | ORAL | Status: DC
  Administered 2022-09-09 – 2022-09-12 (×4): 10 mg via ORAL
  Filled 2022-09-08 (×4): qty 1

## 2022-09-08 MED ORDER — SPIRONOLACTONE 12.5 MG HALF TABLET
12.5000 mg | ORAL_TABLET | Freq: Every day | ORAL | Status: DC
  Administered 2022-09-09 – 2022-09-13 (×5): 12.5 mg via ORAL
  Filled 2022-09-08 (×5): qty 1

## 2022-09-08 MED ORDER — SACCHAROMYCES BOULARDII 250 MG PO CAPS
250.0000 mg | ORAL_CAPSULE | Freq: Two times a day (BID) | ORAL | Status: DC
  Administered 2022-09-09 – 2022-09-13 (×9): 250 mg via ORAL
  Filled 2022-09-08 (×9): qty 1

## 2022-09-08 MED ORDER — INSULIN ASPART 100 UNIT/ML IJ SOLN
0.0000 [IU] | INTRAMUSCULAR | Status: DC
  Administered 2022-09-09: 5 [IU] via SUBCUTANEOUS
  Administered 2022-09-09: 2 [IU] via SUBCUTANEOUS
  Filled 2022-09-08 (×2): qty 1

## 2022-09-08 MED ORDER — IOHEXOL 350 MG/ML SOLN
75.0000 mL | Freq: Once | INTRAVENOUS | Status: AC | PRN
  Administered 2022-09-08: 75 mL via INTRAVENOUS

## 2022-09-08 MED ORDER — SODIUM CHLORIDE 0.9 % IV SOLN
2.0000 g | INTRAVENOUS | Status: DC
  Administered 2022-09-09: 2 g via INTRAVENOUS
  Filled 2022-09-08: qty 20

## 2022-09-08 MED ORDER — HYDROXYZINE HCL 50 MG PO TABS: 25.0000 mg | ORAL_TABLET | Freq: Four times a day (QID) | ORAL | Status: DC | PRN

## 2022-09-08 MED ORDER — ONDANSETRON HCL 4 MG PO TABS
4.0000 mg | ORAL_TABLET | Freq: Four times a day (QID) | ORAL | Status: DC | PRN
  Administered 2022-09-10: 4 mg via ORAL
  Filled 2022-09-08: qty 1

## 2022-09-08 NOTE — ED Notes (Signed)
Bright red blood noted in toilet, moderate amount. MD informed

## 2022-09-08 NOTE — ED Provider Notes (Signed)
[facility] Provider Note    Event Date/Time   First MD Initiated Contact with Patient 09/08/22 1607     (approximate)   History   Rectal Bleeding   HPI  Annette Foster is a 82 y.o. female who presents to the emergency department today because of concerns for bright red blood per rectum.  Symptoms started this morning.  She states that she first noticed it while she was getting ready to go to church.  When she got there she checked and she had further bleeding.  She states she is gone through about 6 pads now.  It has been bright red blood.  This has been accompanied by some rectal discomfort.  She also feels some bloating and distention in her abdomen.  Patient has a very remote history of GI bleed roughly 50 years ago.  Patient denies any shortness of breath or fatigue.  She is not on any blood thinning medication.     Physical Exam   Triage Vital Signs: ED Triage Vitals  Encounter Vitals Group     BP 09/08/22 1556 131/72     Systolic BP Percentile --      Diastolic BP Percentile --      Pulse Rate 09/08/22 1556 67     Resp 09/08/22 1556 16     Temp 09/08/22 1556 98.3 F (36.8 C)     Temp Source 09/08/22 1556 Oral     SpO2 09/08/22 1556 98 %     Weight 09/08/22 1558 139 lb (63 kg)     Height 09/08/22 1558 5\' 3"  (1.6 m)     Head Circumference --      Peak Flow --      Pain Score 09/08/22 1554 4     Pain Loc --      Pain Education --      Exclude from Growth Chart --     Most recent vital signs: Vitals:   09/08/22 1556  BP: 131/72  Pulse: 67  Resp: 16  Temp: 98.3 F (36.8 C)  SpO2: 98%   General: Awake, alert, oriented. CV:  Good peripheral perfusion. Resp:  Normal effort.  Abd:  No distention.   ED Results / Procedures / Treatments   Labs (all labs ordered are listed, but only abnormal results are displayed) Labs Reviewed  COMPREHENSIVE METABOLIC PANEL - Abnormal; Notable for the following components:      Result Value    Glucose, Bld 251 (*)    Creatinine, Ser 1.02 (*)    Calcium 8.7 (*)    Total Protein 8.2 (*)    Albumin 3.3 (*)    GFR, Estimated 55 (*)    All other components within normal limits  CBC - Abnormal; Notable for the following components:   WBC 2.4 (*)    RBC 3.02 (*)    Hemoglobin 9.2 (*)    HCT 28.4 (*)    Platelets 84 (*)    All other components within normal limits  POC OCCULT BLOOD, ED  TYPE AND SCREEN     EKG  None   RADIOLOGY I independently interpreted and visualized the CT angio GI bleed. My interpretation: Small amount of ascites Radiology interpretation:  IMPRESSION:  VASCULAR    1. No evidence of active gastrointestinal hemorrhage.  2. Portal venous hypertension, with gastroesophageal varices and  numerous venous collaterals throughout the abdomen and pelvis.  Portal vein remains patent.  3.  Aortic Atherosclerosis (ICD10-I70.0).  NON-VASCULAR    1. 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  2.  Aortic Atherosclerosis (ICD10-I70.0).      PROCEDURES:  Critical Care performed: No   MEDICATIONS ORDERED IN ED: Medications - No data to display   IMPRESSION / MDM / ASSESSMENT AND PLAN / ED COURSE  I reviewed the triage vital signs and the nursing notes.                              Differential diagnosis includes, but is not limited to, AVM, diverticular bleed, neoplasm, hemorrhoid  Patient's presentation is most consistent with acute presentation with potential threat to life or bodily function.   The patient is on the cardiac monitor to evaluate for evidence of arrhythmia and/or significant heart rate changes.  Patient presented to the emergency department today because of concern for GI bleed.  Patient not hypotensive nor tachycardic here.  Blood work does show anemia however not significantly different from patient's baseline.  Did obtain CT angio which did not show obvious  source of bleed.  At this time I do think likely lower GI bleed.  Will plan on admission for further workup and management.  Dr. Criselda Peaches with the hospitalist service will evaluate for admission.      FINAL CLINICAL IMPRESSION(S) / ED DIAGNOSES   Final diagnoses:  Acute GI bleeding     Note:  This document was prepared using Dragon voice recognition software and may include unintentional dictation errors.    Phineas Semen, MD 09/08/22 2029

## 2022-09-08 NOTE — H&P (Signed)
History and Physical    Annette Foster NFA:[id number] DOB: 1940-09-10 DOA: 09/08/2022  PCP: Jaclyn Shaggy, MD  Patient coming from: Home  Chief Complaint: Bleeding from rectum  HPI: Annette Foster is a 82 y.o. 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  She notes that when she woke up this morning, prior to going to church, she has spontaneous rectal bleeding.  She put on a pad and went to church.  She subsequently went through 3 other pads and underwear due to the bleeding.  She has had bleeding like this before when it was associated with a variceal bleed.  She has been taking her medications including nadolol, spironolactone and torsemide.  She has not had any nausea or hematemesis.  She has not had any constipation or straining.  She has a history of NASH with cirrhosis and follows with Dr. Servando Snare.  Further symptoms include mild rectal pain.  She had an interesting history of peri-anal rectal mass, possible abscess in 2023 (seen on CT scan) and had a sigmoidoscopy around that time as well.  However, bleeding resolved on its own and she has not had issues until this time.  She reports not taking any anticoagulation including aspirin.  She is not taking NSAIDs.   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  Banding was done at that time.   ED Course: In the ED, she was noted to have a Cr of 1.02, normal BUN, Glucose of 251, Alb of 3.3, WBC was low at 2.4 (chronic), H/H were 9.2 and 28.4 which is only slightly lower than baseline.  Platelets were 84 which is also chronic.   She had a CT angio of the abdomen which did not show a source of bleeding.   Review of Systems: As per HPI otherwise all other systems reviewed and are negative.   Past Medical History:  Diagnosis Date   Abdominal pain    Allergy    Cancer (HCC) 2017   breast cancer- Left   Cataract    CHF (congestive heart failure) (HCC)    Collagenous colitis    Diabetes mellitus without complication (HCC)    Diarrhea    Diverticulosis    Dysrhythmia    Fatty liver disease, nonalcoholic    Fibrocystic breast    GERD (gastroesophageal reflux disease)    Heart murmur    Hyperlipidemia    Hypertension    Hypothyroidism    IBS (irritable bowel syndrome)    IDA (iron deficiency anemia)    Personal history of radiation therapy 2017   LEFT BREAST CA   PONV (postoperative nausea and vomiting)    Presence of permanent cardiac pacemaker    Sleep apnea    C-Pap    Past Surgical History:  Procedure Laterality Date   ABDOMINAL HYSTERECTOMY     tah bso   ABDOMINAL SURGERY     APPENDECTOMY     AV NODE ABLATION N/A 05/17/2021   Procedure: AV NODE ABLATION;  Surgeon: Lanier Prude, MD;  [facility];  Service: Cardiovascular;  Laterality: N/A;   BREAST BIOPSY Bilateral 2016  negative   BREAST 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/2015   DCIS, papillary carcinoma in situ   BREAST BIOPSY Left 05/27/2016   BENIGN MAMMARY EPITHELIUM   BREAST BIOPSY Left 11/20/2017   affirm bx x clip BENIGN MAMMARY EPITHELIUM CONSISTENT WITH RAD THERAPY   BREAST EXCISIONAL BIOPSY Right    NEG 1980's   BREAST LUMPECTOMY Left 10/17/2015   DCIS and papillary carcinoma insitu, clear margins   CARDIAC SURGERY     has replacement valve   CATARACT EXTRACTION Right    CATARACT EXTRACTION W/PHACO Left 01/16/2021   Procedure: CATARACT EXTRACTION PHACO AND INTRAOCULAR LENS PLACEMENT (IOC) LEFT DIABETIC 8.46 00:59.8;  Surgeon: Galen Manila, MD;  Location: [facility];  Service: Ophthalmology;  Laterality:  Left;   CHOLECYSTECTOMY     COLONOSCOPY WITH PROPOFOL N/A 03/11/2016   Procedure: COLONOSCOPY WITH PROPOFOL;  Surgeon: Scot Jun, MD;  Location: [facility] ENDOSCOPY;  Service: Endoscopy;  Laterality: N/A;   COLONOSCOPY WITH PROPOFOL N/A 02/18/2020   Procedure: COLONOSCOPY WITH PROPOFOL;  Surgeon: Midge Minium, MD;  Location: [facility] ENDOSCOPY;  Service: Endoscopy;  Laterality: N/A;   ESOPHAGOGASTRODUODENOSCOPY (EGD) WITH PROPOFOL N/A 03/11/2016   Procedure: ESOPHAGOGASTRODUODENOSCOPY (EGD) WITH PROPOFOL;  Surgeon: Scot Jun, MD;  Location: [facility] ENDOSCOPY;  Service: Endoscopy;  Laterality: N/A;   ESOPHAGOGASTRODUODENOSCOPY (EGD) WITH PROPOFOL N/A 02/18/2020   Procedure: ESOPHAGOGASTRODUODENOSCOPY (EGD) WITH PROPOFOL;  Surgeon: Midge Minium, MD;  Location: [facility] ENDOSCOPY;  Service: Endoscopy;  Laterality: N/A;   ESOPHAGOGASTRODUODENOSCOPY (EGD) WITH PROPOFOL N/A 04/25/2020   Procedure: ESOPHAGOGASTRODUODENOSCOPY (EGD) WITH PROPOFOL;  Surgeon: Midge Minium, MD;  Location: [facility] ENDOSCOPY;  Service: Endoscopy;  Laterality: N/A;   ESOPHAGOGASTRODUODENOSCOPY (EGD) WITH PROPOFOL N/A 05/23/2020   Procedure: ESOPHAGOGASTRODUODENOSCOPY (EGD) WITH PROPOFOL;  Surgeon: Midge Minium, MD;  Location: [facility] ENDOSCOPY;  Service: Endoscopy;  Laterality: N/A;   EUS N/A 10/04/2021   Procedure: LOWER ENDOSCOPIC ULTRASOUND (EUS);  Surgeon: Doren Custard, MD;  Location[facility] ENDOSCOPY;  Service: Gastroenterology;  Laterality: N/A;  [facility]   FINGER ARTHROSCOPY WITH CARPOMETACARPEL (CMC) ARTHROPLASTY Right 09/03/2018   Procedure: CARPOMETACARPEL [facility]) ARTHROPLASTY RIGHT THUMB;  Surgeon: Kennedy Bucker, MD;  Location: [facility] ORS;  Service: Orthopedics;  Laterality: Right;   FLEXIBLE SIGMOIDOSCOPY N/A 09/14/2021   Procedure: FLEXIBLE SIGMOIDOSCOPY;  Surgeon: Midge Minium, MD;  Location: [facility] ENDOSCOPY;  Service: Endoscopy;  Laterality: N/A;  No anesthesia   GANGLION CYST EXCISION Right 09/03/2018   Procedure: REMOVAL  GANGLION OF WRIST;  Surgeon: Kennedy Bucker, MD;  Location: [facility] ORS;  Service: Orthopedics;  Laterality: Right;   HARDWARE REMOVAL Right 09/03/2018   Procedure: HARDWARE REMOVAL RIGHT THUMB;  Surgeon: Kennedy Bucker, MD;  Location: [facility] ORS;  Service: Orthopedics;  Laterality: Right;  staple removed   JOINT REPLACEMENT Left    TKR   left sinusplasty      MASTECTOMY, PARTIAL Left 10/17/2015   Procedure: MASTECTOMY PARTIAL REVISION;  Surgeon: Nadeen Landau, MD;  Location: [facility] ORS;  Service: General;  Laterality: Left;   PACEMAKER IMPLANT N/A 03/23/2021   Procedure: PACEMAKER IMPLANT;  Surgeon: Lanier Prude, MD;  [facility]: [facility];  Service: Cardiovascular;  Laterality: N/A;   PARTIAL MASTECTOMY WITH NEEDLE LOCALIZATION Left 09/29/2015   Procedure: PARTIAL MASTECTOMY WITH NEEDLE LOCALIZATION;  Surgeon: Nadeen Landau, MD;  Location: [facility] ORS;  Service: General;  Laterality: Left;   RECTAL EXAM UNDER ANESTHESIA N/A 10/05/2021   Procedure: RECTAL EXAM UNDER ANESTHESIA, ASPIRATION OF RECTAL CYST;  Surgeon: Carolan Shiver, MD;  Location: [facility] ORS;  Service: General;  Laterality: N/A;   TOTAL ABDOMINAL HYSTERECTOMY W/ BILATERAL SALPINGOOPHORECTOMY      Social History  reports that she has never smoked. She has never used smokeless tobacco. She reports that she does not drink alcohol and does not use drugs.  Allergies  Allergen Reactions   Codeine Itching, Nausea And Vomiting and Other (See Comments)   Demeclocycline Rash   Demerol [Meperidine] Itching and Nausea And Vomiting   Hydrocodone Itching and Nausea And Vomiting   Other Other (See Comments)   Oxycodone Itching and Nausea And Vomiting   Pentazocine Itching, Nausea And Vomiting and Other (See Comments)   Tetracyclines & Related Rash   Coal Tar Extract Other (See Comments)   Hydrocodone-Acetaminophen Other (See Comments)   Salicylic Acid Other (See Comments)   Tetracycline Hcl Other (See Comments)    Fentanyl Itching, Nausea And Vomiting and Other (See Comments)    D/W surgical nurse on 7/23 and pt reportedly tolerated dose during surgery.  Dose was given by MD.  Pt reported that she only gets itching and did not object to receiving med    Family History  Problem Relation Age of Onset   Breast cancer Paternal Grandmother    Colon cancer Father    Diabetes Sister    Diabetes Brother    Heart disease Brother    Prostate cancer Brother    Colon cancer Maternal Uncle    Prostate cancer Brother    Bladder Cancer Brother    Leukemia Mother        all   Ovarian cancer Neg Hx    Kidney cancer Neg Hx     Prior to Admission medications   Medication Sig Start Date End Date Taking? Authorizing Provider  acetaminophen (TYLENOL) 325 MG tablet Take 1-2 tablets (325-650 mg total) by mouth every 4 (four) hours as needed for mild pain. 03/24/21   Barrett, Joline Salt, PA-C  Azelastine HCl 137 MCG/SPRAY SOLN INHALE 1-2 PUFFS IN EACH NOSTRIL NASALLY ONCE A DAY 30 DAY(S)    [provider]  Cholecalciferol (VITAMIN D) 50 MCG (2000 UT) CAPS Take 2,000 Units by mouth daily.    [provider]  clobetasol ointment (TEMOVATE) 0.05 % Apply 1 application topically as needed (vaginal irritation).    [provider]  clotrimazole (LOTRIMIN) 1 % cream Apply 1 Application topically 2 (two) times daily as needed.    [provider]  cyanocobalamin (,VITAMIN B-12,) 1000 MCG/ML injection 1,000 mcg every 30 (thirty) days. 04/23/19   [provider]  cyclobenzaprine (FLEXERIL) 5 MG tablet Take 1 tablet by mouth 2 (two) times daily.    [provider]  dicyclomine (BENTYL) 10 MG capsule Take 10 mg by mouth in the morning, at noon, and at bedtime. 05/16/19   [provider]  docusate sodium (COLACE) 100 MG capsule Take 100 mg by mouth 3 (three) times daily as needed for moderate constipation. 01/10/20   [provider]  EPINEPHrine 0.3 mg/0.3 mL IJ SOAJ  injection Inject 0.3 mg into the muscle as needed for anaphylaxis. 05/18/19   [provider]  esomeprazole (NEXIUM) 40 MG capsule Take 40 mg by mouth 2 (two) times daily before a meal.  02/08/16   [provider]       [provider]  fenofibrate 160 MG tablet Take 160 mg by mouth at bedtime.    [provider]  gabapentin (NEURONTIN) 300 MG capsule Take 600 mg by mouth at bedtime.  09/07/16 10/25/36  [provider]  hydrOXYzine (ATARAX/VISTARIL) 25 MG tablet Take 25 mg by mouth every 6 (six) hours as needed for anxiety, itching, nausea or vomiting.    [provider]  Insulin Degludec (TRESIBA) 100 UNIT/ML SOLN CONFIRMED that this is accurate Inject 194 Units into the skin at bedtime. 03/19/21   Raj Janus, MD  levocetirizine (XYZAL) 5 MG tablet Take 5 mg by mouth every evening.    [provider]  levothyroxine (SYNTHROID, LEVOTHROID) 75 MCG tablet Take 75 mcg by mouth daily before breakfast.     [provider]  magnesium oxide (MAG-OX) 400 MG tablet Take 400 mg by mouth 2 (two) times daily.     [provider]  MOUNJARO 7.5 MG/0.5ML Pen Inject 12.5 mg into the skin once a week. 02/15/22   [provider]  nadolol (CORGARD) 20 MG tablet Take 1 tablet (20 mg total) by mouth daily. 08/14/22   End, Cristal Deer, MD       [provider]  NOVOLOG FLEXPEN 100 UNIT/ML FlexPen Inject 48-68 Units into the skin 3 (three) times daily with meals. Based on sliding scale 08/14/19   [provider]  potassium chloride SA (K-DUR,KLOR-CON) 20 MEQ tablet Take 20 mEq by mouth 2 (two) times daily.     [provider]  saccharomyces boulardii (FLORASTOR) 250 MG capsule Take 250 mg by mouth 2 (two) times daily.     [provider]  simvastatin (ZOCOR) 20 MG tablet Take 20 mg by mouth at bedtime.    [provider]  spironolactone (ALDACTONE) 25 MG tablet TAKE 1/2 TABLET BY MOUTH DAILY  05/03/22   End, Cristal Deer, MD  torsemide (DEMADEX) 20 MG tablet TAKE 1 TABLET BY MOUTH EVERY DAY 03/02/21   End, Cristal Deer, MD  valACYclovir (VALTREX) 500 MG tablet Take 500 mg by mouth 2 (two) times daily as needed (fever blisters). 01/06/17   [provider]    Physical Exam: Vitals:   09/08/22 1556 09/08/22 1558 09/08/22 1930  BP: 131/72  (!) 122/56  Pulse: 67  72  Resp: 16  18  Temp: 98.3 F (36.8 C)    TempSrc: Oral    SpO2: 98%  97%  Weight:  63 kg   Height:  5\' 3"  (1.6 m)     Constitutional: NAD, calm, comfortable, lying in bed Eyes: lids and conjunctivae normal, no conjunctival pallor ENMT: Mucous membranes are moist.   Neck: normal, supple Respiratory: Mild crackles at bases, no rales, no wheezing Cardiovascular: RR, + systolic murmur at the LUSB, she has a pacemaker in place in the right chest which appears clean and dry, non tender, no erythema.  She has no peripheral edema Abdomen: + distention, mild epigastric tenderness, +BS Musculoskeletal: normal tone and bulk for age GU: She has external hemorrhoids with some new and dried blood in place, no active bleeding, no lesions.  Skin: no rashes, lesions, ulcers on exposed skin Neurologic: Moves easily in bed, no acute deficit, speech is clear.  Psychiatric: Normal judgment and insight. Alert and oriented x 3. Normal mood.   Labs on Admission: I have personally reviewed following labs and imaging studies  CBC: Recent Labs  Lab 09/08/22 1600  WBC 2.4*  HGB 9.2*  HCT 28.4*  MCV 94.0  PLT 84*    Basic Metabolic Panel: Recent Labs  Lab 09/08/22 1600  NA 138  K 4.3  CL 102  CO2 27  GLUCOSE 251*  BUN 13  CREATININE 1.02*  CALCIUM 8.7*  GFR: Estimated Creatinine Clearance: 38.7 mL/min (A) (by C-G formula based on SCr of 1.02 mg/dL (H)).  Liver Function Tests: Recent Labs  Lab 09/08/22 1600  AST 24  ALT 16  ALKPHOS 60  BILITOT 0.6  PROT 8.2*  ALBUMIN 3.3*    Urine analysis:     Component Value Date/Time   COLORURINE YELLOW (A) 04/01/2022 1335   APPEARANCEUR CLEAR (A) 04/01/2022 1335   APPEARANCEUR Cloudy (A) 07/08/2017 1103   LABSPEC 1.020 04/01/2022 1335   PHURINE 7.0 04/01/2022 1335   GLUCOSEU NEGATIVE 04/01/2022 1335   HGBUR NEGATIVE 04/01/2022 1335   BILIRUBINUR NEGATIVE 04/01/2022 1335   BILIRUBINUR neg 10/10/2020 1112   BILIRUBINUR Negative 07/08/2017 1103   KETONESUR NEGATIVE 04/01/2022 1335   PROTEINUR NEGATIVE 04/01/2022 1335   UROBILINOGEN 0.2 10/10/2020 1112   NITRITE NEGATIVE 04/01/2022 1335   LEUKOCYTESUR NEGATIVE 04/01/2022 1335    Radiological Exams on Admission: CT ANGIO GI BLEED  Result Date: 09/08/2022 CLINICAL DATA:  Bright red blood per rectum EXAM: CTA ABDOMEN AND PELVIS WITHOUT AND WITH CONTRAST TECHNIQUE: Multidetector CT imaging of the abdomen and pelvis was performed using the standard protocol during bolus administration of intravenous contrast. Multiplanar reconstructed images and MIPs were obtained and reviewed to evaluate the vascular anatomy. RADIATION DOSE REDUCTION: This exam was performed according to the departmental dose-optimization program which includes automated exposure control, adjustment of the mA and/or kV according to patient size and/or use of iterative reconstruction technique. CONTRAST:  75mL OMNIPAQUE IOHEXOL 350 MG/ML SOLN COMPARISON:  06/05/2021 FINDINGS: VASCULAR Aorta: Normal caliber aorta without aneurysm, dissection, vasculitis or significant stenosis. Diffuse atherosclerosis. Celiac: Patent without evidence of aneurysm, dissection, vasculitis or significant stenosis. SMA: Patent without evidence of aneurysm, dissection, vasculitis or significant stenosis. Renals: There are 3 renal arteries identified bilaterally. No high-grade stenosis, aneurysm, or dissection. No evidence of vasculitis. IMA: Patent without evidence of aneurysm, dissection, vasculitis or significant stenosis. Inflow: Patent without evidence of  aneurysm, dissection, vasculitis or significant stenosis. Proximal Outflow: Bilateral common femoral and visualized portions of the superficial and profunda femoral arteries are patent without evidence of aneurysm, dissection, vasculitis or significant stenosis. Veins: Stable findings of portal venous hypertension, with large gastroesophageal varices, recanalization of the umbilical vein, and multiple pelvic varices identified. The SMV, splenic vein, and portal vein remain patent. Remaining venous structures are unremarkable. Review of the MIP images confirms the above findings. NON-VASCULAR Lower chest: Bibasilar areas of scarring. No acute airspace disease. Heart is enlarged without pericardial effusion. Hepatobiliary: Stable findings of cirrhosis, with nodularity of the liver capsule. No focal liver abnormality. Prior cholecystectomy. No biliary duct dilation. Pancreas: Unremarkable. No pancreatic ductal dilatation or surrounding inflammatory changes. Spleen: The spleen is enlarged, measuring 17.2 x 18.9 x 10.0 cm. No focal splenic abnormality. Adrenals/Urinary Tract: Adrenal glands are unremarkable. Kidneys are normal, without renal calculi, focal lesion, or hydronephrosis. Bladder is unremarkable. Stomach/Bowel: No bowel obstruction or ileus. No bowel wall thickening or inflammatory change. Small hiatal hernia. There is no intraluminal accumulation of contrast to suggest active gastrointestinal hemorrhage. Lymphatic: Nonspecific subcentimeter retroperitoneal lymph nodes. No pathologic adenopathy. Reproductive: Status post hysterectomy. No adnexal masses. Other: Trace upper abdominal ascites. No free intraperitoneal gas. No abdominal wall hernia. Musculoskeletal: No acute or destructive bony abnormalities. Reconstructed images demonstrate no additional findings. IMPRESSION: VASCULAR 1. No evidence of active gastrointestinal hemorrhage. 2. Portal venous hypertension, with gastroesophageal varices and numerous  venous collaterals throughout the abdomen and pelvis. Portal vein remains patent. 3.  Aortic Atherosclerosis (ICD10-I70.0). NON-VASCULAR 1.  Cirrhosis, with portal venous hypertension manifested by ascites, splenomegaly, and varices as above. 2.  Aortic Atherosclerosis (ICD10-I70.0). Electronically Signed   By: Sharlet Salina M.D.   On: 09/08/2022 18:53     Assessment/Plan  Lower GI bleed Hematochezia Gastrointestinal hemorrhage - DDx includes diverticular bleeding (not seen on colonoscopy in the past), hemorrhoidal bleeding (most likely), fast upper GI bleeding (vitals are stable, BUN normal).   - Trend H/H, no need for transfusion today - Monitor on telemetry for changes - Consult GI in the am if she continues to have blood loss, Dr. Servando Snare is her GI doctor - She is not taking anticoagulation, antiplatelets or NSAIDs - She reports taking her medications for her cirrhosis.  - CTA of the abdomen did not show any acute bleeding.  - I don't think octreotide or IV PPI indicated at this time.  If bleeding worsens or H/H drops, I will plan to start these medications.   Hepatic cirrhosis (HCC) NASH (nonalcoholic steatohepatitis) 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 bleeding (HCC) Portal hypertension (HCC) - She has known cirrhosis, following with GI and getting ultrasounds and AFPs every 6 months - She has external hemorrhoids on exam, known varices - Monitor for change in H/H, transfuse as needed - Consider GI consult in the AM - Hold fluids given ascites - Consider ultrasound if ascites worsens, consider paracentesis - Continue nadolol, torsemide, spironolactone - Will start SBP prophylaxis; rocephin 2gm/day  Other pancytopenia (HCC) Iron deficiency - Chronic, related to liver disease - Monitor, no need for transfusions currently - Trend CBC    Hypothyroidism - Continue home synthroid dose    HTN (hypertension) - Monitor blood pressure closely - Nadolol, torsemide and spironolactone  for liver disease will also treat this.     Diabetes (HCC) - She is on VERY high doses of insulin, confirmed with patient - I have started her on about 1/3 of her long acting medication; lantus 80 units daily and a SSI - She is not currently taking Mounjaro    GERD (gastroesophageal reflux disease) - Continue PPI - protonix is formulary alternative    Chronic heart failure with preserved ejection fraction (HFpEF) (HCC) Pacemaker  - Monitor on telemetry, continue nadolol - Pacemaker pocket appears clean, dry, no erythema  She is also taking bentyl, flexeril, fenofibrate, gabapentin which I continued for chronic issues.         DVT prophylaxis: SCDs  Code Status:   DNR, confirmed with patient  Disposition Plan:   Patient is from:  Home  Anticipated DC to:  Home  Anticipated DC date:  09/09/22  Anticipated DC barriers: Recurrent bleeding  Consults called:  None, possibly GI  Admission status:  Obs, telemetry   Severity of Illness: The appropriate patient status for this patient is OBSERVATION. Observation status is judged to be reasonable and necessary in order to provide the required intensity of service to ensure the patient's safety. The patient's presenting symptoms, physical exam findings, and initial radiographic and laboratory data in the context of their medical condition is felt to place them at decreased risk for further clinical deterioration. Furthermore, it is anticipated that the patient will be medically stable for discharge from the hospital within 2 midnights of admission.     Debe Coder MD [facility]  How to contact the [facility] Attending or Consulting provider 7A - 7P or covering provider during after hours 7P -7A, for this patient?   Check the care team in [facility] and look for a) attending/consulting [facility] provider  listed and b) the [facility] team listed Log into [email or web address] and use [place]'s universal password to access. If you do not have the password, please  contact the hospital operator. Locate the [facility] provider you are looking for under [facility] and page to a number that you can be directly reached. If you still have difficulty reaching the provider, please page the [facility] (Director on Call) for the Hospitalists listed on amion for assistance.  09/08/2022, 9:16 PM

## 2022-09-08 NOTE — ED Notes (Signed)
Pt being transported to rm 130 via wc at this time.

## 2022-09-08 NOTE — ED Notes (Signed)
Pt assisted to the bathroom.

## 2022-09-08 NOTE — ED Triage Notes (Signed)
Pt to ED POV for bright red rectal bleeding since this morning. Denies blood thinners. Also rectal pain. Denies hemorrhoids. Endorses abdominal bloating. States this all started today at church.  Alert, oriented, skin dry. L arm is restricted. Has pacemaker to R chest and glucose monitor to R arm.

## 2022-09-09 ENCOUNTER — Encounter: Payer: Self-pay | Admitting: Internal Medicine

## 2022-09-09 DIAGNOSIS — K922 Gastrointestinal hemorrhage, unspecified: Secondary | ICD-10-CM

## 2022-09-09 LAB — GLUCOSE, CAPILLARY
Glucose-Capillary: 111 mg/dL — ABNORMAL HIGH (ref 70–99)
Glucose-Capillary: 112 mg/dL — ABNORMAL HIGH (ref 70–99)
Glucose-Capillary: 150 mg/dL — ABNORMAL HIGH (ref 70–99)
Glucose-Capillary: 246 mg/dL — ABNORMAL HIGH (ref 70–99)
Glucose-Capillary: 79 mg/dL (ref 70–99)
Glucose-Capillary: 94 mg/dL (ref 70–99)

## 2022-09-09 LAB — GLUCOSE, POCT (MANUAL RESULT ENTRY): POC Glucose: 150 mg/dl — AB (ref 70–99)

## 2022-09-09 MED ORDER — POTASSIUM CHLORIDE 20 MEQ PO PACK
20.0000 meq | PACK | Freq: Two times a day (BID) | ORAL | Status: DC
  Administered 2022-09-09: 20 meq via ORAL
  Filled 2022-09-09: qty 1

## 2022-09-09 MED ORDER — INSULIN GLARGINE-YFGN 100 UNIT/ML ~~LOC~~ SOLN
60.0000 [IU] | Freq: Every day | SUBCUTANEOUS | Status: DC
  Administered 2022-09-09: 60 [IU] via SUBCUTANEOUS
  Filled 2022-09-09 (×2): qty 0.6

## 2022-09-09 MED ORDER — PEG 3350-KCL-NA BICARB-NACL 420 G PO SOLR
4000.0000 mL | Freq: Once | ORAL | Status: AC
  Administered 2022-09-09: 4000 mL via ORAL
  Filled 2022-09-09: qty 4000

## 2022-09-09 MED ORDER — SODIUM CHLORIDE 0.9 % IV SOLN: INTRAVENOUS | Status: DC

## 2022-09-09 MED ORDER — POTASSIUM CHLORIDE 20 MEQ PO PACK
40.0000 meq | PACK | Freq: Once | ORAL | Status: AC
  Administered 2022-09-09: 40 meq via ORAL
  Filled 2022-09-09: qty 2

## 2022-09-09 NOTE — Consult Note (Signed)
Annette Foster , MD [address], [address], [place], [place], [address] [address], [address], [place], [place], [address] Phone: [phone]  Fax: [phone]  Consultation  Referring Provider:   Dr Allena Katz Primary Care Physician:  Jaclyn Shaggy, MD Primary Gastroenterologist:  Dr. Servando Snare         Reason for Consultation:     rectal bleed  Date of Admission:  09/08/2022 Date of Consultation:  09/09/2022         HPI:   Annette Foster is a 82 y.o. female who sees Dr. Servando Snare as an outpatient, last seen at the office in January 2024 Had a flexible sigmoidoscopy in 09/30/2021 and a palpable rectal mass was found on digital exam thought to be extrinsic, internal hemorrhoids were also noted.  Sequently an EUS was performed that showed a subepithelial lesion in the rectum 2 cm from the anal verge not circumferential the left anterior rectal wall layer of the wall could not be determined 14 mm x 19 mm lymph nodes were normal in the rectum area looks like a benign inflammatory condition no malignant lymph nodes were seen. 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  Cirrhosis noted with ascites splenomegaly.  This admission presents with bleeding per rectum went through 3 pads and underwear was stained with blood.  Undergone endoscopy with banding of varices in 2022 the patient had no follow-up for surveillance or banding. Denies any hematemesis or abdominal pain , no recent nsaid use. Says last bowel movement was earlier today and had some blood in it .  Past Medical History:  Diagnosis Date   Abdominal pain    Allergy    Cancer (HCC) 2017   breast cancer- Left   Cataract    CHF (congestive heart failure) (HCC)    Collagenous colitis    Diabetes mellitus without complication (HCC)    Diarrhea    Diverticulosis    Dysrhythmia     Fatty liver disease, nonalcoholic    Fibrocystic breast    GERD (gastroesophageal reflux disease)    Heart murmur    Hyperlipidemia    Hypertension    Hypothyroidism    IBS (irritable bowel syndrome)    IDA (iron deficiency anemia)    Personal history of radiation therapy 2017   LEFT BREAST CA   PONV (postoperative nausea and vomiting)    Presence of permanent cardiac pacemaker    Sleep apnea    C-Pap    Past Surgical History:  Procedure Laterality Date   ABDOMINAL HYSTERECTOMY     tah bso   ABDOMINAL SURGERY     APPENDECTOMY     AV NODE ABLATION N/A 05/17/2021   Procedure: AV NODE ABLATION;  Surgeon: Lanier Prude, MD;  [facility]: [facility];  Service: Cardiovascular;  Laterality: N/A;   BREAST BIOPSY Bilateral 2016   negative   BREAST 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/2015   DCIS, papillary carcinoma in situ   BREAST BIOPSY Left 05/27/2016   BENIGN MAMMARY EPITHELIUM   BREAST BIOPSY Left 11/20/2017   affirm bx x clip BENIGN MAMMARY EPITHELIUM CONSISTENT WITH RAD THERAPY   BREAST EXCISIONAL BIOPSY Right    NEG 1980's   BREAST LUMPECTOMY Left 10/17/2015   DCIS and papillary carcinoma insitu, clear margins   CARDIAC SURGERY     has replacement valve  CATARACT EXTRACTION Right    CATARACT EXTRACTION W/PHACO Left 01/16/2021   Procedure: CATARACT EXTRACTION PHACO AND INTRAOCULAR LENS PLACEMENT (IOC) LEFT DIABETIC 8.46 00:59.8;  Surgeon: Galen Manila, MD;  Location: [facility];  Service: Ophthalmology;  Laterality: Left;   CHOLECYSTECTOMY     COLONOSCOPY WITH PROPOFOL N/A 03/11/2016   Procedure: COLONOSCOPY WITH PROPOFOL;  Surgeon: Scot Jun, MD;  Location: [facility];  Service: Endoscopy;  Laterality: N/A;   COLONOSCOPY WITH PROPOFOL N/A 02/18/2020   Procedure: COLONOSCOPY WITH PROPOFOL;  Surgeon: Midge Minium, MD;  Location: [facility] ENDOSCOPY;  Service: Endoscopy;  Laterality: N/A;   ESOPHAGOGASTRODUODENOSCOPY (EGD) WITH PROPOFOL N/A 03/11/2016    Procedure: ESOPHAGOGASTRODUODENOSCOPY (EGD) WITH PROPOFOL;  Surgeon: Scot Jun, MD;  Location: [facility] ENDOSCOPY;  Service: Endoscopy;  Laterality: N/A;   ESOPHAGOGASTRODUODENOSCOPY (EGD) WITH PROPOFOL N/A 02/18/2020   Procedure: ESOPHAGOGASTRODUODENOSCOPY (EGD) WITH PROPOFOL;  Surgeon: Midge Minium, MD;  Location: [facility] ENDOSCOPY;  Service: Endoscopy;  Laterality: N/A;   ESOPHAGOGASTRODUODENOSCOPY (EGD) WITH PROPOFOL N/A 04/25/2020   Procedure: ESOPHAGOGASTRODUODENOSCOPY (EGD) WITH PROPOFOL;  Surgeon: Midge Minium, MD;  Location: [facility] ENDOSCOPY;  Service: Endoscopy;  Laterality: N/A;   ESOPHAGOGASTRODUODENOSCOPY (EGD) WITH PROPOFOL N/A 05/23/2020   Procedure: ESOPHAGOGASTRODUODENOSCOPY (EGD) WITH PROPOFOL;  Surgeon: Midge Minium, MD;  Location: [facility] ENDOSCOPY;  Service: Endoscopy;  Laterality: N/A;   EUS N/A 10/04/2021   Procedure: LOWER ENDOSCOPIC ULTRASOUND (EUS);  Surgeon: Doren Custard, MD;  Location: [facility] ENDOSCOPY;  Service: Gastroenterology;  Laterality: N/A;  [facility]   FINGER ARTHROSCOPY WITH CARPOMETACARPEL (CMC) ARTHROPLASTY Right 09/03/2018   Procedure: CARPOMETACARPEL [facility]) ARTHROPLASTY RIGHT THUMB;  Surgeon: Kennedy Bucker, MD;  Location: [facility] ORS;  Service: Orthopedics;  Laterality: Right;   FLEXIBLE SIGMOIDOSCOPY N/A 09/14/2021   Procedure: FLEXIBLE SIGMOIDOSCOPY;  Surgeon: Midge Minium, MD;  Location: [facility] ENDOSCOPY;  Service: Endoscopy;  Laterality: N/A;  No anesthesia   GANGLION CYST EXCISION Right 09/03/2018   Procedure: REMOVAL GANGLION OF WRIST;  Surgeon: Kennedy Bucker, MD;  Location: [facility] ORS;  Service: Orthopedics;  Laterality: Right;   HARDWARE REMOVAL Right 09/03/2018   Procedure: HARDWARE REMOVAL RIGHT THUMB;  Surgeon: Kennedy Bucker, MD;  Location: [facility] ORS;  Service: Orthopedics;  Laterality: Right;  staple removed   JOINT REPLACEMENT Left    TKR   left sinusplasty      MASTECTOMY, PARTIAL Left 10/17/2015   Procedure: MASTECTOMY PARTIAL REVISION;  Surgeon: Nadeen Landau, MD;  Location: [facility] ORS;  Service: General;  Laterality: Left;   PACEMAKER IMPLANT N/A 03/23/2021   Procedure: PACEMAKER IMPLANT;  Surgeon: Lanier Prude, MD;  Location: [facility];  Service: Cardiovascular;  Laterality: N/A;   PARTIAL MASTECTOMY WITH NEEDLE LOCALIZATION Left 09/29/2015   Procedure: PARTIAL MASTECTOMY WITH NEEDLE LOCALIZATION;  Surgeon: Nadeen Landau, MD;  Location: [facility];  Service: General;  Laterality: Left;   RECTAL EXAM UNDER ANESTHESIA N/A 10/05/2021   Procedure: RECTAL EXAM UNDER ANESTHESIA, ASPIRATION OF RECTAL CYST;  Surgeon: Carolan Shiver, MD;  Location: [facility] ORS;  Service: General;  Laterality: N/A;   TOTAL ABDOMINAL HYSTERECTOMY W/ BILATERAL SALPINGOOPHORECTOMY      Prior to Admission medications   Medication Sig Start Date End Date Taking? Authorizing Provider  acetaminophen (TYLENOL) 325 MG tablet Take 1-2 tablets (325-650 mg total) by mouth every 4 (four) hours as needed for mild pain. 03/24/21   Barrett, Joline Salt, PA-C  Azelastine HCl 137 MCG/SPRAY SOLN INHALE 1-2 PUFFS IN EACH NOSTRIL NASALLY ONCE A DAY 30 DAY(S)    [provider]  Cholecalciferol (VITAMIN D) 50 MCG (2000 UT) CAPS Take 2,000 Units by mouth daily.    [provider]  clobetasol ointment (TEMOVATE) 0.05 % Apply 1 application topically as needed (vaginal irritation).    [provider]  clotrimazole (LOTRIMIN) 1 % cream Apply 1 Application topically 2 (two) times daily as needed.    [provider]  cyanocobalamin (,VITAMIN B-12,) 1000 MCG/ML injection 1,000 mcg every 30 (thirty) days. 04/23/19   [provider]  cyclobenzaprine (FLEXERIL) 5 MG tablet Take 1 tablet by mouth 2 (two) times daily.    [provider]  dicyclomine (BENTYL) 10 MG capsule Take 10 mg by mouth in the morning, at noon, and at bedtime. 05/16/19   [provider]  docusate sodium (COLACE) 100 MG capsule Take 100 mg by mouth 3  (three) times daily as needed for moderate constipation. 01/10/20   [provider]  EPINEPHrine 0.3 mg/0.3 mL IJ SOAJ injection Inject 0.3 mg into the muscle as needed for anaphylaxis. 05/18/19   [provider]  esomeprazole (NEXIUM) 40 MG capsule Take 40 mg by mouth 2 (two) times daily before a meal.  02/08/16   [provider]  estradiol (ESTRACE) 0.1 MG/GM vaginal cream Place 1 Applicatorful vaginally daily as needed (irritation).    [provider]  fenofibrate 160 MG tablet Take 160 mg by mouth at bedtime.    [provider]  gabapentin (NEURONTIN) 300 MG capsule Take 300 mg by mouth at bedtime.  09/07/16 10/25/36  [provider]  hydrOXYzine (ATARAX/VISTARIL) 25 MG tablet Take 25 mg by mouth every 6 (six) hours as needed for anxiety, itching, nausea or vomiting.    [provider]  Insulin Degludec (TRESIBA) 100 UNIT/ML SOLN Inject 194 Units into the skin at bedtime. 03/19/21   Raj Janus, MD  levocetirizine (XYZAL) 5 MG tablet Take 5 mg by mouth every evening.    [provider]  levothyroxine (SYNTHROID, LEVOTHROID) 75 MCG tablet Take 75 mcg by mouth daily before breakfast.     [provider]  magnesium oxide (MAG-OX) 400 MG tablet Take 400 mg by mouth 2 (two) times daily.     [provider]  MOUNJARO 7.5 MG/0.5ML Pen Inject 12.5 mg into the skin once a week. 02/15/22   [provider]  nadolol (CORGARD) 20 MG tablet Take 1 tablet (20 mg total) by mouth daily. 08/14/22   End, Cristal Deer, MD  NONFORMULARY OR COMPOUNDED ITEM Nifedipine 0.3% plus lidocaine 2% cream apply to rectum BID and after every BM.    [provider]  NOVOLOG FLEXPEN 100 UNIT/ML FlexPen Inject 48-68 Units into the skin 3 (three) times daily with meals. Based on sliding scale 08/14/19   [provider]  potassium chloride SA (K-DUR,KLOR-CON) 20 MEQ tablet Take 20 mEq by mouth 2 (two) times daily.     [provider]  saccharomyces boulardii (FLORASTOR) 250 MG capsule Take 250 mg by mouth 2 (two) times daily.     [provider]  simvastatin (ZOCOR) 20 MG tablet Take 20 mg by mouth at bedtime.    [provider]  spironolactone (ALDACTONE) 25 MG tablet TAKE 1/2 TABLET BY MOUTH DAILY 05/03/22   End, Cristal Deer, MD  torsemide (DEMADEX) 20 MG tablet TAKE 1 TABLET BY MOUTH EVERY DAY 03/02/21   End, Cristal Deer, MD  TRESIBA FLEXTOUCH 200 UNIT/ML FlexTouch Pen 194 Unit(s) SUB-Q Daily    [provider]  valACYclovir (VALTREX) 500  MG tablet Take 500 mg by mouth 2 (two) times daily as needed (fever blisters). 01/06/17   [provider]    Family History  Problem Relation Age of Onset   Breast cancer Paternal Grandmother    Colon cancer Father    Diabetes Sister    Diabetes Brother    Heart disease Brother    Prostate cancer Brother    Colon cancer Maternal Uncle    Prostate cancer Brother    Bladder Cancer Brother    Leukemia Mother        all   Ovarian cancer Neg Hx    Kidney cancer Neg Hx      Social History   Tobacco Use   Smoking status: Never   Smokeless tobacco: Never  Vaping Use   Vaping status: Never Used  Substance Use Topics   Alcohol use: No    Alcohol/week: 0.0 standard drinks of alcohol   Drug use: No    Allergies as of 09/08/2022 - Review Complete 09/08/2022  Allergen Reaction Noted   Codeine Itching, Nausea And Vomiting, and Other (See Comments) 06/28/2014   Demeclocycline Rash 04/10/2012   Demerol [meperidine] Itching and Nausea And Vomiting 06/28/2014   Hydrocodone Itching and Nausea And Vomiting 08/24/2018   Other Other (See Comments) 04/10/2012   Oxycodone Itching and Nausea And Vomiting 09/26/2015   Pentazocine Itching, Nausea And Vomiting, and Other (See Comments) 04/10/2012   Tetracyclines & related Rash 06/28/2014   Coal tar extract Other (See Comments) 03/19/2022   Hydrocodone-acetaminophen Other (See Comments)  01/30/2018   Salicylic acid Other (See Comments) 04/02/2022   Tetracycline hcl Other (See Comments) 04/02/2022   Fentanyl Itching, Nausea And Vomiting, and Other (See Comments) 06/28/2014    Review of Systems:    All systems reviewed and negative except where noted in HPI.   Physical Exam:  Vital signs in last 24 hours: Temp:  [98.1 F (36.7 C)-98.9 F (37.2 C)] 98.2 F (36.8 C) (07/29 1140) Pulse Rate:  [60-81] 76 (07/29 1140) Resp:  [16-20] 20 (07/29 1140) BP: (106-131)/(46-72) 123/66 (07/29 1140) SpO2:  [94 %-100 %] 99 % (07/29 1140) Weight:  [63 kg] 63 kg (07/28 1558) Last BM Date : 09/08/22 General:   Pleasant, cooperative in NAD Head:  Normocephalic and atraumatic. Eyes:   No icterus.   Conjunctiva pink. PERRLA. Ears:  Normal auditory acuity. Neck:  Supple; no masses or thyroidomegaly Lungs: Respirations even and unlabored. Lungs clear to auscultation bilaterally.   No wheezes, crackles, or rhonchi.  Heart:  Regular rate and rhythm;  Without murmur, clicks, rubs or gallops Abdomen:  Soft, nondistended, nontender. Normal bowel sounds. No appreciable masses or hepatomegaly.  No rebound or guarding.  Neurologic:  Alert and oriented x3;  grossly normal neurologically. Skin:  Intact without significant lesions or rashes. Cervical Nodes:  No significant cervical adenopathy. Psych:  Alert and cooperative. Normal affect.  LAB RESULTS: Recent Labs    09/08/22 1600 09/09/22 0004 09/09/22 0533  WBC 2.4* 2.3* 1.9*  HGB 9.2* 9.5* 8.4*  HCT 28.4* 29.1* 25.5*  PLT 84* 86* 75*   BMET Recent Labs    09/08/22 1600 09/09/22 0533  NA 138 137  K 4.3 3.1*  CL 102 105  CO2 27 25  GLUCOSE 251* 136*  BUN 13 13  CREATININE 1.02* 0.66  CALCIUM 8.7* 8.3*   LFT Recent Labs    09/08/22 1600  PROT 8.2*  ALBUMIN 3.3*  AST 24  ALT 16  ALKPHOS 60  BILITOT 0.6   PT/INR No results for input(s): "LABPROT", "INR" in the last 72 hours.  STUDIES: CT ANGIO GI BLEED  Result  Date: 09/08/2022 CLINICAL DATA:  Bright red blood per rectum EXAM: CTA ABDOMEN AND PELVIS WITHOUT AND WITH CONTRAST TECHNIQUE: Multidetector CT imaging of the abdomen and pelvis was performed using the standard protocol during bolus administration of intravenous contrast. Multiplanar reconstructed images and MIPs were obtained and reviewed to evaluate the vascular anatomy. RADIATION DOSE REDUCTION: This exam was performed according to the departmental dose-optimization program which includes automated exposure control, adjustment of the mA and/or kV according to patient size and/or use of iterative reconstruction technique. CONTRAST:  75mL OMNIPAQUE IOHEXOL 350 MG/ML SOLN COMPARISON:  06/05/2021 FINDINGS: VASCULAR Aorta: Normal caliber aorta without aneurysm, dissection, vasculitis or significant stenosis. Diffuse atherosclerosis. Celiac: Patent without evidence of aneurysm, dissection, vasculitis or significant stenosis. SMA: Patent without evidence of aneurysm, dissection, vasculitis or significant stenosis. Renals: There are 3 renal arteries identified bilaterally. No high-grade stenosis, aneurysm, or dissection. No evidence of vasculitis. IMA: Patent without evidence of aneurysm, dissection, vasculitis or significant stenosis. Inflow: Patent without evidence of aneurysm, dissection, vasculitis or significant stenosis. Proximal Outflow: Bilateral common femoral and visualized portions of the superficial and profunda femoral arteries are patent without evidence of aneurysm, dissection, vasculitis or significant stenosis. Veins: Stable findings of portal venous hypertension, with large gastroesophageal varices, recanalization of the umbilical vein, and multiple pelvic varices identified. The SMV, splenic vein, and portal vein remain patent. Remaining venous structures are unremarkable. Review of the MIP images confirms the above findings. NON-VASCULAR Lower chest: Bibasilar areas of scarring. No acute airspace  disease. Heart is enlarged without pericardial effusion. Hepatobiliary: Stable findings of cirrhosis, with nodularity of the liver capsule. No focal liver abnormality. Prior cholecystectomy. No biliary duct dilation. Pancreas: Unremarkable. No pancreatic ductal dilatation or surrounding inflammatory changes. Spleen: The spleen is enlarged, measuring 17.2 x 18.9 x 10.0 cm. No focal splenic abnormality. Adrenals/Urinary Tract: Adrenal glands are unremarkable. Kidneys are normal, without renal calculi, focal lesion, or hydronephrosis. Bladder is unremarkable. Stomach/Bowel: No bowel obstruction or ileus. No bowel wall thickening or inflammatory change. Small hiatal hernia. There is no intraluminal accumulation of contrast to suggest active gastrointestinal hemorrhage. Lymphatic: Nonspecific subcentimeter retroperitoneal lymph nodes. No pathologic adenopathy. Reproductive: Status post hysterectomy. No adnexal masses. Other: Trace upper abdominal ascites. No free intraperitoneal gas. No abdominal wall hernia. Musculoskeletal: No acute or destructive bony abnormalities. Reconstructed images demonstrate no additional findings. IMPRESSION: VASCULAR 1. No evidence of active gastrointestinal hemorrhage. 2. Portal venous hypertension, with gastroesophageal varices and numerous venous collaterals throughout the abdomen and pelvis. Portal vein remains patent. 3.  Aortic Atherosclerosis (ICD10-I70.0). NON-VASCULAR 1. Cirrhosis, with portal venous hypertension manifested by ascites, splenomegaly, and varices as above. 2.  Aortic Atherosclerosis (ICD10-I70.0). Electronically Signed   By: Sharlet Salina M.D.   On: 09/08/2022 18:53      Impression / Plan:   Annette Foster is a 82 y.o. 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  She follows with Dr. Servando Snare as an outpatient.  Presents emergency room with rectal  bleeding.  In 2023 had a sigmoidoscopy to evaluate rectal bleeding had a subepithelial lesion that was further evaluated by EUS and was thought to be benign that subsequently resolved but came in overnight with painless rectal bleeding.  Last episode was earlier this morning.  Denies any emesis.  Hemoglobin is down by 2 g from baseline.  No elevation in BUN/creatinine ratio.  This is less likely an upper GI bleed more likely a lower GI bleed.  Last colonoscopy was back in January 2022 and the diffuse area of severely friable mucosa was seen in the entire colon but no biopsies were taken.  Impression: This is more likely a lower GI bleed.  Differentials ranged from colitis to a diverticular bleed although no diverticulosis was seen on past endoscopy over 2 years back.  It has been over 2 years since she has had a last upper endoscopy and a overdue for surveillance.   Plan 1.  Monitor CBC transfuse as needed 2.  EGD and colonoscopy tomorrow   I have discussed alternative options, risks & benefits,  which include, but are not limited to, bleeding, infection, perforation,respiratory complication & drug reaction.  The patient agrees with this plan & written consent will be obtained.     Thank you for involving me in the care of this patient.      LOS: 0 days   Annette Mood, MD  09/09/2022, 11:59 AM

## 2022-09-09 NOTE — Care Plan (Signed)
Repeat H/H improved to baseline.  Continue to monitor.  No PRBC ordered as of yet.  Debe Coder, MD

## 2022-09-09 NOTE — Plan of Care (Signed)

## 2022-09-09 NOTE — Progress Notes (Signed)
[facility]  - [place] [facility]   PATIENT NAME: Annette Foster    MR#:  [id number]  DATE OF BIRTH:  1940/03/10  SUBJECTIVE:   Sitting out in the chair. No family at bedside. Denies any rectal bleed. RN reported few streaks overloaded with yellow BM today. No abdominal pain are vomiting.   VITALS:  Blood pressure (!) 112/53, pulse 78, temperature (!) 97.4 F (36.3 C), resp. rate 17, height 5\' 3"  (1.6 m), weight 63 kg, SpO2 100%.  PHYSICAL EXAMINATION:   GENERAL:  82 y.o.-year-old patient with no acute distress.  LUNGS: Normal breath sounds bilaterally, no wheezing CARDIOVASCULAR: S1, S2 normal. No murmur   ABDOMEN: Soft, nontender, mild distended. Bowel sounds present.  EXTREMITIES: No  edema b/l.    NEUROLOGIC: nonfocal  patient is alert and awake SKIN: No obvious rash, lesion, or ulcer.   LABORATORY PANEL:  CBC Recent Labs  Lab 09/09/22 0533  WBC 1.9*  HGB 8.4*  HCT 25.5*  PLT 75*    Chemistries  Recent Labs  Lab 09/08/22 1600 09/09/22 0533  NA 138 137  K 4.3 3.1*  CL 102 105  CO2 27 25  GLUCOSE 251* 136*  BUN 13 13  CREATININE 1.02* 0.66  CALCIUM 8.7* 8.3*  AST 24  --   ALT 16  --   ALKPHOS 60  --   BILITOT 0.6  --    Cardiac Enzymes No results for input(s): "TROPONINI" in the last 168 hours. RADIOLOGY:  CT ANGIO GI BLEED  Result Date: 09/08/2022 CLINICAL DATA:  Bright red blood per rectum EXAM: CTA ABDOMEN AND PELVIS WITHOUT AND WITH CONTRAST TECHNIQUE: Multidetector CT imaging of the abdomen and pelvis was performed using the standard protocol during bolus administration of intravenous contrast. Multiplanar reconstructed images and MIPs were obtained and reviewed to evaluate the vascular anatomy. RADIATION DOSE REDUCTION: This exam was performed according to the departmental dose-optimization program which includes automated exposure control, adjustment of the mA and/or kV according to patient size and/or use of iterative  reconstruction technique. CONTRAST:  75mL OMNIPAQUE IOHEXOL 350 MG/ML SOLN COMPARISON:  06/05/2021 FINDINGS: VASCULAR Aorta: Normal caliber aorta without aneurysm, dissection, vasculitis or significant stenosis. Diffuse atherosclerosis. Celiac: Patent without evidence of aneurysm, dissection, vasculitis or significant stenosis. SMA: Patent without evidence of aneurysm, dissection, vasculitis or significant stenosis. Renals: There are 3 renal arteries identified bilaterally. No high-grade stenosis, aneurysm, or dissection. No evidence of vasculitis. IMA: Patent without evidence of aneurysm, dissection, vasculitis or significant stenosis. Inflow: Patent without evidence of aneurysm, dissection, vasculitis or significant stenosis. Proximal Outflow: Bilateral common femoral and visualized portions of the superficial and profunda femoral arteries are patent without evidence of aneurysm, dissection, vasculitis or significant stenosis. Veins: Stable findings of portal venous hypertension, with large gastroesophageal varices, recanalization of the umbilical vein, and multiple pelvic varices identified. The SMV, splenic vein, and portal vein remain patent. Remaining venous structures are unremarkable. Review of the MIP images confirms the above findings. NON-VASCULAR Lower chest: Bibasilar areas of scarring. No acute airspace disease. Heart is enlarged without pericardial effusion. Hepatobiliary: Stable findings of cirrhosis, with nodularity of the liver capsule. No focal liver abnormality. Prior cholecystectomy. No biliary duct dilation. Pancreas: Unremarkable. No pancreatic ductal dilatation or surrounding inflammatory changes. Spleen: The spleen is enlarged, measuring 17.2 x 18.9 x 10.0 cm. No focal splenic abnormality. Adrenals/Urinary Tract: Adrenal glands are unremarkable. Kidneys are normal, without renal calculi, focal lesion, or hydronephrosis. Bladder is unremarkable. Stomach/Bowel: No bowel obstruction or ileus.  No bowel wall thickening or inflammatory change. Small hiatal hernia. There is no intraluminal accumulation of contrast to suggest active gastrointestinal hemorrhage. Lymphatic: Nonspecific subcentimeter retroperitoneal lymph nodes. No pathologic adenopathy. Reproductive: Status post hysterectomy. No adnexal masses. Other: Trace upper abdominal ascites. No free intraperitoneal gas. No abdominal wall hernia. Musculoskeletal: No acute or destructive bony abnormalities. Reconstructed images demonstrate no additional findings. IMPRESSION: VASCULAR 1. No evidence of active gastrointestinal hemorrhage. 2. Portal venous hypertension, with gastroesophageal varices and numerous venous collaterals throughout the abdomen and pelvis. Portal vein remains patent. 3.  Aortic Atherosclerosis (ICD10-I70.0). NON-VASCULAR 1. Cirrhosis, with portal venous hypertension manifested by ascites, splenomegaly, and varices as above. 2.  Aortic Atherosclerosis (ICD10-I70.0). Electronically Signed   By: Sharlet Salina M.D.   On: 09/08/2022 18:53    Assessment and Plan [name] is a 83 y.o. 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  She notes that when she woke up this morning, prior to going to church, she has spontaneous rectal bleeding.  She put on a pad and went to church.  She subsequently went through 3 other pads and underwear due to the bleeding.  She has had bleeding like this before when it was associated with a variceal bleed.    Last colonoscopy showed friable colon, internal hemorrhoids in 2022   Last EGD also in 2022 showed grade III varices and portal hypertensive gastropathy.  Banding was done at  that time.    Lower GI bleed Hematochezia Gastrointestinal hemorrhage - DDx includes diverticular bleeding (not seen on colonoscopy in the past), hemorrhoidal bleeding (most likely)  - Trend H/H, no need for transfusion today --hgb 9.2--9.5--8.4 -  Consult GI Dr Anna--recommends Egd/colonoscopy - She is not taking anticoagulation, antiplatelets or NSAIDs - She reports taking her medications for her cirrhosis.  - CTA of the abdomen did not show any acute bleeding.  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) Portal hypertension (HCC) - She has known cirrhosis, following with GI and getting ultrasounds and AFPs every 6 months - She has external hemorrhoids on exam, known varices - Monitor for change in H/H, transfuse as needed - Hold fluids given ascites - Consider ultrasound if ascites worsens, consider paracentesis - Continue nadolol, torsemide, spironolactone   Other pancytopenia (HCC) Iron deficiency - Chronic, related to liver disease - Trend CBC    Hypothyroidism - Continue home synthroid dose    HTN (hypertension) - Nadolol, torsemide and spironolactone for liver disease will also treat this.     Diabetes (HCC) - She is on VERY high doses of insulin, confirmed with patient - started her on about 1/3 of her long acting medication; lantus 80 units daily and a SSI - She is not currently taking Mounjaro    GERD (gastroesophageal reflux disease) - Continue PPI   Chronic heart failure with preserved ejection fraction (HFpEF) (HCC) Pacemaker  --continue nadolol - Pacemaker pocket appears clean, dry, no erythema           DVT prophylaxis:      SCDs  Code Status:              DNR, confirmed with patient  Procedures: Family communication :none  today Consults :GI CODE STATUS:  DVT Prophylaxis : Level of care: Telemetry Medical  Rectal bleed--pt to get luminal eval tomorrow   TOTAL  TIME TAKING CARE OF THIS PATIENT: 35 minutes.  >50% time spent on counselling and coordination of care  Note: This dictation was prepared with Dragon dictation along with smaller phrase technology. Any transcriptional errors that result from this process are unintentional.  Enedina Finner M.D    [facility]   CC: Primary care physician; Jaclyn Shaggy, MD

## 2022-09-10 ENCOUNTER — Observation Stay: Payer: Medicare PPO | Admitting: Anesthesiology

## 2022-09-10 ENCOUNTER — Encounter
Admission: EM | Disposition: A | Discharge status: Home Health | Payer: Self-pay | Source: Home / Self Care | Attending: Internal Medicine

## 2022-09-10 ENCOUNTER — Encounter: Payer: Self-pay | Admitting: Internal Medicine

## 2022-09-10 DIAGNOSIS — D126 Benign neoplasm of colon, unspecified: Secondary | ICD-10-CM

## 2022-09-10 DIAGNOSIS — E11649 Type 2 diabetes mellitus with hypoglycemia without coma: Secondary | ICD-10-CM | POA: Diagnosis not present

## 2022-09-10 DIAGNOSIS — E611 Iron deficiency: Secondary | ICD-10-CM | POA: Diagnosis not present

## 2022-09-10 DIAGNOSIS — R188 Other ascites: Secondary | ICD-10-CM | POA: Diagnosis present

## 2022-09-10 DIAGNOSIS — E785 Hyperlipidemia, unspecified: Secondary | ICD-10-CM | POA: Diagnosis present

## 2022-09-10 DIAGNOSIS — I4891 Unspecified atrial fibrillation: Secondary | ICD-10-CM | POA: Diagnosis present

## 2022-09-10 DIAGNOSIS — D124 Benign neoplasm of descending colon: Secondary | ICD-10-CM | POA: Diagnosis present

## 2022-09-10 DIAGNOSIS — Z853 Personal history of malignant neoplasm of breast: Secondary | ICD-10-CM | POA: Diagnosis not present

## 2022-09-10 DIAGNOSIS — I110 Hypertensive heart disease with heart failure: Secondary | ICD-10-CM | POA: Diagnosis present

## 2022-09-10 DIAGNOSIS — E039 Hypothyroidism, unspecified: Secondary | ICD-10-CM | POA: Diagnosis present

## 2022-09-10 DIAGNOSIS — J690 Pneumonitis due to inhalation of food and vomit: Secondary | ICD-10-CM | POA: Diagnosis present

## 2022-09-10 DIAGNOSIS — I8511 Secondary esophageal varices with bleeding: Secondary | ICD-10-CM | POA: Diagnosis present

## 2022-09-10 DIAGNOSIS — D61818 Other pancytopenia: Secondary | ICD-10-CM | POA: Diagnosis present

## 2022-09-10 DIAGNOSIS — I5032 Chronic diastolic (congestive) heart failure: Secondary | ICD-10-CM | POA: Diagnosis present

## 2022-09-10 DIAGNOSIS — Z66 Do not resuscitate: Secondary | ICD-10-CM | POA: Diagnosis present

## 2022-09-10 DIAGNOSIS — K922 Gastrointestinal hemorrhage, unspecified: Secondary | ICD-10-CM | POA: Diagnosis not present

## 2022-09-10 DIAGNOSIS — K219 Gastro-esophageal reflux disease without esophagitis: Secondary | ICD-10-CM | POA: Diagnosis present

## 2022-09-10 DIAGNOSIS — Z923 Personal history of irradiation: Secondary | ICD-10-CM | POA: Diagnosis not present

## 2022-09-10 DIAGNOSIS — Z833 Family history of diabetes mellitus: Secondary | ICD-10-CM | POA: Diagnosis not present

## 2022-09-10 DIAGNOSIS — Z8249 Family history of ischemic heart disease and other diseases of the circulatory system: Secondary | ICD-10-CM | POA: Diagnosis not present

## 2022-09-10 DIAGNOSIS — G9341 Metabolic encephalopathy: Secondary | ICD-10-CM | POA: Diagnosis present

## 2022-09-10 DIAGNOSIS — K625 Hemorrhage of anus and rectum: Secondary | ICD-10-CM | POA: Diagnosis present

## 2022-09-10 DIAGNOSIS — Z950 Presence of cardiac pacemaker: Secondary | ICD-10-CM | POA: Diagnosis not present

## 2022-09-10 DIAGNOSIS — K7460 Unspecified cirrhosis of liver: Secondary | ICD-10-CM | POA: Diagnosis present

## 2022-09-10 DIAGNOSIS — K921 Melena: Secondary | ICD-10-CM | POA: Diagnosis not present

## 2022-09-10 DIAGNOSIS — Z79890 Hormone replacement therapy (postmenopausal): Secondary | ICD-10-CM | POA: Diagnosis not present

## 2022-09-10 DIAGNOSIS — Z794 Long term (current) use of insulin: Secondary | ICD-10-CM | POA: Diagnosis not present

## 2022-09-10 DIAGNOSIS — K7581 Nonalcoholic steatohepatitis (NASH): Secondary | ICD-10-CM | POA: Diagnosis present

## 2022-09-10 DIAGNOSIS — K766 Portal hypertension: Secondary | ICD-10-CM | POA: Diagnosis present

## 2022-09-10 HISTORY — PX: ESOPHAGOGASTRODUODENOSCOPY (EGD) WITH PROPOFOL: SHX5813

## 2022-09-10 HISTORY — PX: ESOPHAGEAL BANDING: SHX5518

## 2022-09-10 HISTORY — PX: HEMOSTASIS CLIP PLACEMENT: SHX6857

## 2022-09-10 HISTORY — PX: HEMOSTASIS CONTROL: SHX6838

## 2022-09-10 HISTORY — PX: POLYPECTOMY: SHX5525

## 2022-09-10 HISTORY — PX: COLONOSCOPY WITH PROPOFOL: SHX5780

## 2022-09-10 LAB — CBC
HCT: 26.1 % — ABNORMAL LOW (ref 36.0–46.0)
Hemoglobin: 8.4 g/dL — ABNORMAL LOW (ref 12.0–15.0)
MCH: 30.1 pg (ref 26.0–34.0)
MCHC: 32.2 g/dL (ref 30.0–36.0)
MCV: 93.5 fL (ref 80.0–100.0)
Platelets: 75 10*3/uL — ABNORMAL LOW (ref 150–400)
RBC: 2.79 MIL/uL — ABNORMAL LOW (ref 3.87–5.11)
RDW: 13.6 % (ref 11.5–15.5)
WBC: 2 10*3/uL — ABNORMAL LOW (ref 4.0–10.5)
nRBC: 0 % (ref 0.0–0.2)

## 2022-09-10 LAB — HEMOGLOBIN AND HEMATOCRIT, BLOOD
HCT: 28.3 % — ABNORMAL LOW (ref 36.0–46.0)
Hemoglobin: 9.2 g/dL — ABNORMAL LOW (ref 12.0–15.0)

## 2022-09-10 LAB — GLUCOSE, CAPILLARY
Glucose-Capillary: 106 mg/dL — ABNORMAL HIGH (ref 70–99)
Glucose-Capillary: 123 mg/dL — ABNORMAL HIGH (ref 70–99)
Glucose-Capillary: 167 mg/dL — ABNORMAL HIGH (ref 70–99)
Glucose-Capillary: 179 mg/dL — ABNORMAL HIGH (ref 70–99)
Glucose-Capillary: 56 mg/dL — ABNORMAL LOW (ref 70–99)
Glucose-Capillary: 66 mg/dL — ABNORMAL LOW (ref 70–99)
Glucose-Capillary: 79 mg/dL (ref 70–99)
Glucose-Capillary: 81 mg/dL (ref 70–99)
Glucose-Capillary: 81 mg/dL (ref 70–99)
Glucose-Capillary: 90 mg/dL (ref 70–99)

## 2022-09-10 SURGERY — ESOPHAGOGASTRODUODENOSCOPY (EGD) WITH PROPOFOL
Anesthesia: General

## 2022-09-10 MED ORDER — INSULIN GLARGINE-YFGN 100 UNIT/ML ~~LOC~~ SOLN
25.0000 [IU] | Freq: Every day | SUBCUTANEOUS | Status: DC
  Filled 2022-09-10: qty 0.25

## 2022-09-10 MED ORDER — PEG 3350-KCL-NA BICARB-NACL 420 G PO SOLR
4000.0000 mL | Freq: Once | ORAL | Status: DC
  Filled 2022-09-10: qty 4000

## 2022-09-10 MED ORDER — DEXTROSE 50 % IV SOLN
1.0000 | Freq: Once | INTRAVENOUS | Status: AC
  Administered 2022-09-10: 50 mL via INTRAVENOUS
  Filled 2022-09-10: qty 50

## 2022-09-10 MED ORDER — FLEET ENEMA 7-19 GM/118ML RE ENEM
1.0000 | ENEMA | Freq: Once | RECTAL | Status: AC
  Administered 2022-09-10: 1 via RECTAL

## 2022-09-10 MED ORDER — LIDOCAINE HCL (PF) 2 % IJ SOLN
INTRAMUSCULAR | Status: AC
  Filled 2022-09-10: qty 15

## 2022-09-10 MED ORDER — SODIUM CHLORIDE 0.9 % IV SOLN
50.0000 ug/h | INTRAVENOUS | Status: DC
  Administered 2022-09-10 – 2022-09-12 (×4): 50 ug/h via INTRAVENOUS
  Filled 2022-09-10 (×5): qty 1

## 2022-09-10 MED ORDER — EPHEDRINE SULFATE (PRESSORS) 50 MG/ML IJ SOLN
INTRAMUSCULAR | Status: DC | PRN
  Administered 2022-09-10: 7.5 mg via INTRAVENOUS
  Administered 2022-09-10: 5 mg via INTRAVENOUS

## 2022-09-10 MED ORDER — TRAMADOL HCL 50 MG PO TABS
50.0000 mg | ORAL_TABLET | Freq: Four times a day (QID) | ORAL | Status: DC | PRN
  Administered 2022-09-10: 50 mg via ORAL
  Filled 2022-09-10: qty 1

## 2022-09-10 MED ORDER — LIDOCAINE HCL (CARDIAC) PF 100 MG/5ML IV SOSY
PREFILLED_SYRINGE | INTRAVENOUS | Status: DC | PRN
  Administered 2022-09-10: 80 mg via INTRAVENOUS

## 2022-09-10 MED ORDER — PROPOFOL 10 MG/ML IV BOLUS
INTRAVENOUS | Status: AC
  Filled 2022-09-10: qty 20

## 2022-09-10 MED ORDER — DEXTROSE 50 % IV SOLN
1.0000 | Freq: Once | INTRAVENOUS | Status: AC
  Administered 2022-09-10: 50 mL via INTRAVENOUS

## 2022-09-10 MED ORDER — DEXTROSE 50 % IV SOLN
INTRAVENOUS | Status: AC
  Filled 2022-09-10: qty 50

## 2022-09-10 MED ORDER — SODIUM CHLORIDE 0.9 % IV SOLN: INTRAVENOUS | Status: DC

## 2022-09-10 MED ORDER — DEXTROSE 5 % IV SOLN: INTRAVENOUS | Status: AC

## 2022-09-10 MED ORDER — INSULIN ASPART 100 UNIT/ML IJ SOLN
0.0000 [IU] | INTRAMUSCULAR | Status: DC
  Administered 2022-09-11: 2 [IU] via SUBCUTANEOUS
  Filled 2022-09-10: qty 1

## 2022-09-10 MED ORDER — MORPHINE SULFATE (PF) 2 MG/ML IV SOLN: 2.0000 mg | INTRAVENOUS | Status: DC | PRN

## 2022-09-10 MED ORDER — POTASSIUM CHLORIDE CRYS ER 20 MEQ PO TBCR
20.0000 meq | EXTENDED_RELEASE_TABLET | Freq: Two times a day (BID) | ORAL | Status: DC
  Administered 2022-09-10 – 2022-09-13 (×7): 20 meq via ORAL
  Filled 2022-09-10 (×7): qty 1

## 2022-09-10 MED ORDER — PROPOFOL 500 MG/50ML IV EMUL
INTRAVENOUS | Status: DC | PRN
  Administered 2022-09-10: 125 ug/kg/min via INTRAVENOUS

## 2022-09-10 MED ORDER — PROPOFOL 10 MG/ML IV BOLUS
INTRAVENOUS | Status: DC | PRN
  Administered 2022-09-10: 30 mg via INTRAVENOUS
  Administered 2022-09-10: 80 mg via INTRAVENOUS

## 2022-09-10 MED ORDER — SODIUM CHLORIDE 0.9 % IV SOLN
1.0000 g | INTRAVENOUS | Status: DC
  Administered 2022-09-10 – 2022-09-12 (×3): 1 g via INTRAVENOUS
  Filled 2022-09-10 (×4): qty 10

## 2022-09-10 MED ORDER — GLYCOPYRROLATE 0.2 MG/ML IJ SOLN
INTRAMUSCULAR | Status: DC | PRN
  Administered 2022-09-10: .2 mg via INTRAVENOUS

## 2022-09-10 MED ORDER — LIDOCAINE HCL (PF) 2 % IJ SOLN
INTRAMUSCULAR | Status: AC
  Filled 2022-09-10: qty 5

## 2022-09-10 MED ORDER — EPHEDRINE 5 MG/ML INJ
INTRAVENOUS | Status: AC
  Filled 2022-09-10: qty 5

## 2022-09-10 MED ORDER — SODIUM CHLORIDE 0.9 % IV BOLUS
250.0000 mL | Freq: Once | INTRAVENOUS | Status: AC
  Administered 2022-09-10: 250 mL via INTRAVENOUS

## 2022-09-10 NOTE — Anesthesia Preprocedure Evaluation (Addendum)
Anesthesia Evaluation  Patient identified by MRN, date of birth, ID band Patient awake    Reviewed: Allergy & Precautions, H&P , NPO status , Patient's Chart, lab work & pertinent test results  History of Anesthesia Complications (+) PONV and history of anesthetic complications  Airway Mallampati: II  TM Distance: >3 FB Neck ROM: full    Dental  (+) Partial Lower, Partial Upper   Pulmonary sleep apnea    Pulmonary exam normal        Cardiovascular Exercise Tolerance: Poor hypertension, +CHF (Chronic heart failure with preserved ejection fraction (HFpEF))  + dysrhythmias (s/p AV NODE ABLATION) + pacemaker  + Systolic murmurs    Neuro/Psych negative neurological ROS  negative psych ROS   GI/Hepatic ,GERD  ,,(+) Hepatitis - (Hepatic cirrhosis)Hematochezia Gastrointestinal hemorrhage    Endo/Other  diabetes, Type 2Hypothyroidism    Renal/GU negative Renal ROS  negative genitourinary   Musculoskeletal   Abdominal Normal abdominal exam  (+)   Peds  Hematology  (+) Blood dyscrasia, anemia Thrombocytopenia   Anesthesia Other Findings Past Medical History: No date: Abdominal pain No date: Allergy 2017: Cancer (HCC)     Comment:  breast cancer- Left No date: Cataract No date: CHF (congestive heart failure) (HCC) No date: Collagenous colitis No date: Diabetes mellitus without complication (HCC) No date: Diarrhea No date: Diverticulosis No date: Dysrhythmia No date: Fatty liver disease, nonalcoholic No date: Fibrocystic breast No date: GERD (gastroesophageal reflux disease) No date: Heart murmur No date: Hyperlipidemia No date: Hypertension No date: Hypothyroidism No date: IBS (irritable bowel syndrome) No date: IDA (iron deficiency anemia) 2017: Personal history of radiation therapy     Comment:  LEFT BREAST CA No date: PONV (postoperative nausea and vomiting) No date: Presence of permanent cardiac  pacemaker No date: Sleep apnea     Comment:  C-Pap  Past Surgical History: No date: ABDOMINAL HYSTERECTOMY     Comment:  [place] bso No date: ABDOMINAL SURGERY No date: APPENDECTOMY 05/17/2021: AV NODE ABLATION; N/A     Comment:  Procedure: AV NODE ABLATION;  Surgeon: Lanier Prude, MD;  Location: [facility];  Service:               Cardiovascular;  Laterality: N/A; 2016: BREAST BIOPSY; Bilateral     Comment:  negative 09/11/2015: BREAST BIOPSY; Left     Comment:  DCIS, papillary carcinoma in situ 05/27/2016: BREAST BIOPSY; Left     Comment:  BENIGN MAMMARY EPITHELIUM 11/20/2017: BREAST BIOPSY; Left     Comment:  affirm bx x clip BENIGN MAMMARY EPITHELIUM CONSISTENT               WITH RAD THERAPY No date: BREAST EXCISIONAL BIOPSY; Right     Comment:  NEG 1980's 10/17/2015: BREAST LUMPECTOMY; Left     Comment:  DCIS and papillary carcinoma insitu, clear margins No date: CARDIAC SURGERY     Comment:  has replacement valve No date: CATARACT EXTRACTION; Right 01/16/2021: CATARACT EXTRACTION W/PHACO; Left     Comment:  Procedure: CATARACT EXTRACTION PHACO AND INTRAOCULAR               LENS PLACEMENT (IOC) LEFT DIABETIC 8.46 00:59.8;                Surgeon: Galen Manila, MD;  Location[facility] SURG[facility]              CN[facility];  Service: Ophthalmology;  Laterality: Left; No date: CHOLECYSTECTOMY 03/11/2016: COLONOSCOPY WITH PROPOFOL; N/A     Comment:  Procedure: COLONOSCOPY WITH PROPOFOL;  Surgeon: Scot Jun, MD;  Location: [facility] ENDOSCOPY;  Service:               Endoscopy;  Laterality: N/A; 02/18/2020: COLONOSCOPY WITH PROPOFOL; N/A     Comment:  Procedure: COLONOSCOPY WITH PROPOFOL;  Surgeon: Midge Minium, MD;  Location: [facility] ENDOSCOPY;  Service:               Endoscopy;  Laterality: N/A; 03/11/2016: ESOPHAGOGASTRODUODENOSCOPY (EGD) WITH PROPOFOL; N/A     Comment:  Procedure: ESOPHAGOGASTRODUODENOSCOPY (EGD) WITH                PROPOFOL;  Surgeon: Scot Jun, MD;  Location: [facility]              ENDOSCOPY;  Service: Endoscopy;  Laterality: N/A; 02/18/2020: ESOPHAGOGASTRODUODENOSCOPY (EGD) WITH PROPOFOL; N/A     Comment:  Procedure: ESOPHAGOGASTRODUODENOSCOPY (EGD) WITH               PROPOFOL;  Surgeon: Midge Minium, MD;  Location: [facility]               ENDOSCOPY;  Service: Endoscopy;  Laterality: N/A; 04/25/2020: ESOPHAGOGASTRODUODENOSCOPY (EGD) WITH PROPOFOL; N/A     Comment:  Procedure: ESOPHAGOGASTRODUODENOSCOPY (EGD) WITH               PROPOFOL;  Surgeon: Midge Minium, MD;  Location: [facility]               ENDOSCOPY;  Service: Endoscopy;  Laterality: N/A; 05/23/2020: ESOPHAGOGASTRODUODENOSCOPY (EGD) WITH PROPOFOL; N/A     Comment:  Procedure: ESOPHAGOGASTRODUODENOSCOPY (EGD) WITH               PROPOFOL;  Surgeon: Midge Minium, MD;  Location: [facility]               ENDOSCOPY;  Service: Endoscopy;  Laterality: N/A; 10/04/2021: EUS; N/A     Comment:  Procedure: LOWER ENDOSCOPIC ULTRASOUND (EUS);  Surgeon:               Doren Custard, MD;  Location: [facility] ENDOSCOPY;                Service: Gastroenterology;  Laterality: N/A;  [facility] No date: EYE SURGERY 09/03/2018: FINGER ARTHROSCOPY WITH CARPOMETACARPEL ([facility])  ARTHROPLASTY; Right     Comment:  Procedure: CARPOMETACARPEL [facility]) ARTHROPLASTY RIGHT               THUMB;  Surgeon: Kennedy Bucker, MD;  Location: [facility] ORS;               Service: Orthopedics;  Laterality: Right; 09/14/2021: FLEXIBLE SIGMOIDOSCOPY; N/A     Comment:  Procedure: FLEXIBLE SIGMOIDOSCOPY;  Surgeon: Midge Minium, MD;  Location: [facility] ENDOSCOPY;  Service:               Endoscopy;  Laterality: N/A;  No anesthesia 09/03/2018: GANGLION CYST EXCISION; Right     Comment:  Procedure: REMOVAL GANGLION OF WRIST;  Surgeon: Kennedy Bucker, MD;  Location: [facility] ORS;  Service: Orthopedics;  Laterality: Right; 09/03/2018: HARDWARE REMOVAL; Right     Comment:   Procedure: HARDWARE REMOVAL RIGHT THUMB;  Surgeon: Kennedy Bucker, MD;  Location: [facility] ORS;  Service: Orthopedics;               Laterality: Right;  staple removed No date: JOINT REPLACEMENT; Left     Comment:  TKR No date: left sinusplasty  10/17/2015: MASTECTOMY, PARTIAL; Left     Comment:  Procedure: MASTECTOMY PARTIAL REVISION;  Surgeon: Nadeen Landau, MD;  Location: [facility] ORS;  Service: General;              Laterality: Left; 03/23/2021: PACEMAKER IMPLANT; N/A     Comment:  Procedure: PACEMAKER IMPLANT;  Surgeon: Lanier Prude, MD;  Location: [facility];  Service:               Cardiovascular;  Laterality: N/A; 09/29/2015: PARTIAL MASTECTOMY WITH NEEDLE LOCALIZATION; Left     Comment:  Procedure: PARTIAL MASTECTOMY WITH NEEDLE LOCALIZATION;               Surgeon: Nadeen Landau, MD;  Location: [facility] ORS;                Service: General;  Laterality: Left; 10/05/2021: RECTAL EXAM UNDER ANESTHESIA; N/A     Comment:  Procedure: RECTAL EXAM UNDER ANESTHESIA, ASPIRATION OF               RECTAL CYST;  Surgeon: Carolan Shiver, MD;                Location: [facility] ORS;  Service: General;  Laterality: N/A; No date: TOTAL ABDOMINAL HYSTERECTOMY W/ BILATERAL SALPINGOOPHORECTOMY  BMI    Body Mass Index: 24.62 kg/m      Reproductive/Obstetrics negative OB ROS                             Anesthesia Physical Anesthesia Plan  ASA: 3  Anesthesia Plan: General   Post-op Pain Management:    Induction: Intravenous  PONV Risk Score and Plan: Propofol infusion and TIVA  Airway Management Planned: Natural Airway  Additional Equipment:   Intra-op Plan:   Post-operative Plan:   Informed Consent: I have reviewed the patients History and Physical, chart, labs and discussed the procedure including the risks, benefits and alternatives for the proposed anesthesia with the patient or authorized  representative who has indicated his/her understanding and acceptance.     Dental Advisory Given  Plan Discussed with: CRNA  Anesthesia Plan Comments: (Hypoglycemia in pre-op with no symptoms. She had hypoglycemia this morning treated with an amp of D50. Will repeat treatment. )        Anesthesia Quick Evaluation

## 2022-09-10 NOTE — Progress Notes (Signed)
[facility]  - [place] [facility]   PATIENT NAME: Annette Foster    MR#:  [id number]  DATE OF BIRTH:  August 09, 1940  SUBJECTIVE:   Sitting out in the chair. No family at bedside. Denies any rectal bleed.finished colon prep   VITALS:  Blood pressure 108/64, pulse 76, temperature (!) 96.8 F (36 C), temperature source Temporal, resp. rate 17, height 5\' 3"  (1.6 m), weight 63 kg, SpO2 100%.  PHYSICAL EXAMINATION:   GENERAL:  82 y.o.-year-old patient with no acute distress.  LUNGS: Normal breath sounds bilaterally, no wheezing CARDIOVASCULAR: S1, S2 normal. No murmur   ABDOMEN: Soft, nontender, mild distended. Bowel sounds present.  EXTREMITIES: No  edema b/l.    NEUROLOGIC: nonfocal  patient is alert and awake SKIN: No obvious rash, lesion, or ulcer.   LABORATORY PANEL:  CBC Recent Labs  Lab 09/10/22 0344  WBC 2.0*  HGB 8.4*  HCT 26.1*  PLT 75*    Chemistries  Recent Labs  Lab 09/08/22 1600 09/09/22 0533 09/10/22 0344  NA 138   < > 138  K 4.3   < > 3.3*  CL 102   < > 105  CO2 27   < > 25  GLUCOSE 251*   < > 65*  BUN 13   < > 10  CREATININE 1.02*   < > 0.66  CALCIUM 8.7*   < > 8.7*  AST 24  --   --   ALT 16  --   --   ALKPHOS 60  --   --   BILITOT 0.6  --   --    < > = values in this interval not displayed.   Cardiac Enzymes No results for input(s): "TROPONINI" in the last 168 hours. RADIOLOGY:  CT ANGIO GI BLEED  Result Date: 09/08/2022 CLINICAL DATA:  Bright red blood per rectum EXAM: CTA ABDOMEN AND PELVIS WITHOUT AND WITH CONTRAST TECHNIQUE: Multidetector CT imaging of the abdomen and pelvis was performed using the standard protocol during bolus administration of intravenous contrast. Multiplanar reconstructed images and MIPs were obtained and reviewed to evaluate the vascular anatomy. RADIATION DOSE REDUCTION: This exam was performed according to the departmental dose-optimization program which includes automated exposure control,  adjustment of the mA and/or kV according to patient size and/or use of iterative reconstruction technique. CONTRAST:  75mL OMNIPAQUE IOHEXOL 350 MG/ML SOLN COMPARISON:  06/05/2021 FINDINGS: VASCULAR Aorta: Normal caliber aorta without aneurysm, dissection, vasculitis or significant stenosis. Diffuse atherosclerosis. Celiac: Patent without evidence of aneurysm, dissection, vasculitis or significant stenosis. SMA: Patent without evidence of aneurysm, dissection, vasculitis or significant stenosis. Renals: There are 3 renal arteries identified bilaterally. No high-grade stenosis, aneurysm, or dissection. No evidence of vasculitis. IMA: Patent without evidence of aneurysm, dissection, vasculitis or significant stenosis. Inflow: Patent without evidence of aneurysm, dissection, vasculitis or significant stenosis. Proximal Outflow: Bilateral common femoral and visualized portions of the superficial and profunda femoral arteries are patent without evidence of aneurysm, dissection, vasculitis or significant stenosis. Veins: Stable findings of portal venous hypertension, with large gastroesophageal varices, recanalization of the umbilical vein, and multiple pelvic varices identified. The SMV, splenic vein, and portal vein remain patent. Remaining venous structures are unremarkable. Review of the MIP images confirms the above findings. NON-VASCULAR Lower chest: Bibasilar areas of scarring. No acute airspace disease. Heart is enlarged without pericardial effusion. Hepatobiliary: Stable findings of cirrhosis, with nodularity of the liver capsule. No focal liver abnormality. Prior cholecystectomy. No biliary duct dilation. Pancreas: Unremarkable. No pancreatic  ductal dilatation or surrounding inflammatory changes. Spleen: The spleen is enlarged, measuring 17.2 x 18.9 x 10.0 cm. No focal splenic abnormality. Adrenals/Urinary Tract: Adrenal glands are unremarkable. Kidneys are normal, without renal calculi, focal lesion, or  hydronephrosis. Bladder is unremarkable. Stomach/Bowel: No bowel obstruction or ileus. No bowel wall thickening or inflammatory change. Small hiatal hernia. There is no intraluminal accumulation of contrast to suggest active gastrointestinal hemorrhage. Lymphatic: Nonspecific subcentimeter retroperitoneal lymph nodes. No pathologic adenopathy. Reproductive: Status post hysterectomy. No adnexal masses. Other: Trace upper abdominal ascites. No free intraperitoneal gas. No abdominal wall hernia. Musculoskeletal: No acute or destructive bony abnormalities. Reconstructed images demonstrate no additional findings. IMPRESSION: VASCULAR 1. No evidence of active gastrointestinal hemorrhage. 2. Portal venous hypertension, with gastroesophageal varices and numerous venous collaterals throughout the abdomen and pelvis. Portal vein remains patent. 3.  Aortic Atherosclerosis (ICD10-I70.0). NON-VASCULAR 1. Cirrhosis, with portal venous hypertension manifested by ascites, splenomegaly, and varices as above. 2.  Aortic Atherosclerosis (ICD10-I70.0). Electronically Signed   By: Sharlet Salina M.D.   On: 09/08/2022 18:53    Assessment and Plan Annette Foster is a 82 y.o. 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  She notes that when she woke up this morning, prior to going to church, she has spontaneous rectal bleeding.  She put on a pad and went to church.  She subsequently went through 3 other pads and underwear due to the bleeding.  She has had bleeding like this before when it was associated with a variceal bleed.    Last colonoscopy showed friable colon, internal hemorrhoids in 2022   Last EGD also in  2022 showed grade III varices and portal hypertensive gastropathy.  Banding was done at that time.    Lower GI bleed Hematochezia Gastrointestinal hemorrhage - DDx includes diverticular bleeding (not seen on colonoscopy in the past), hemorrhoidal bleeding (most likely)  - Trend H/H, no need for transfusion today --hgb 9.2--9.5--8.4 -  Consult GI Dr Anna--recommends Egd/colonoscopy - She is not taking anticoagulation, antiplatelets or NSAIDs - She reports taking her medications for her cirrhosis.  - CTA of the abdomen did not show any acute bleeding.  - cont PPI --EGD--- Normal examined duodenum.                        - Portal hypertensive gastropathy.                        - Grade III esophageal varices. Completely eradicated.                         Banded.                        - No specimens collected. --colonoscopy Two 6 to 8 mm polyps in the descending colon,                         removed with a cold snare. Resected and retrieved.                         Clips  were placed.                        - One 5 mm polyp in the ascending colon, removed with                         a cold snare. Resected and retrieved. 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) Portal hypertension (HCC) - She has known cirrhosis, following with GI and getting ultrasounds and AFPs every 6 months - She has external hemorrhoids on exam, known varices - Monitor for change in H/H, transfuse as needed - Hold fluids given ascites - Consider ultrasound if ascites worsens, consider paracentesis - Continue nadolol, torsemide, spironolactone   Other pancytopenia (HCC) Iron deficiency - Chronic, related to liver disease - Trend CBC    Hypothyroidism - Continue home synthroid dose    HTN (hypertension) - Nadolol, torsemide and  spironolactone for liver disease will also treat this.     Diabetes (HCC) - She is on VERY high doses of insulin, confirmed with patient - adjust ong acting insulin according to sugars and cont SSI - She is not currently taking Mounjaro    GERD (gastroesophageal reflux disease) - Continue PPI   Chronic heart failure with preserved ejection fraction (HFpEF) (HCC) Pacemaker  --continue nadolol - Pacemaker pocket appears clean, dry, no erythema           DVT prophylaxis:      SCDs  Code Status:              DNR, confirmed with patient  Procedures:EGD/colonoscopy Family communication :none today Consults :GI CODE STATUS:  DVT Prophylaxis : Level of care: Telemetry Medical   TOTAL TIME TAKING CARE OF THIS PATIENT: 35 minutes.  >50% time spent on counselling and coordination of care  Note: This dictation was prepared with Dragon dictation along with smaller phrase technology. Any transcriptional errors that result from this process are unintentional.  Enedina Finner M.D    [facility]   CC: Primary care physician; Jaclyn Shaggy, MD

## 2022-09-10 NOTE — H&P (Signed)
Wyline Mood, MD [address], [place], [place], [address] [address], [address], [place], [place], [address] Phone: [phone]  Fax: [phone]  Primary Care Physician:  Jaclyn Shaggy, MD   Pre-Procedure History & Physical: HPI:  Annette Foster is a 82 y.o. female is here for an endoscopy and colonoscopy    Past Medical History:  Diagnosis Date   Abdominal pain    Allergy    Cancer (HCC) 2017   breast cancer- Left   Cataract    CHF (congestive heart failure) (HCC)    Collagenous colitis    Diabetes mellitus without complication (HCC)    Diarrhea    Diverticulosis    Dysrhythmia    Fatty liver disease, nonalcoholic    Fibrocystic breast    GERD (gastroesophageal reflux disease)    Heart murmur    Hyperlipidemia    Hypertension    Hypothyroidism    IBS (irritable bowel syndrome)    IDA (iron deficiency anemia)    Personal history of radiation therapy 2017   LEFT BREAST CA   PONV (postoperative nausea and vomiting)    Presence of permanent cardiac pacemaker    Sleep apnea    C-Pap    Past Surgical History:  Procedure Laterality Date   ABDOMINAL HYSTERECTOMY     tah bso   ABDOMINAL SURGERY     APPENDECTOMY     AV NODE ABLATION N/A 05/17/2021   Procedure: AV NODE ABLATION;  Surgeon: Lanier Prude, MD;  [facility]: [facility];  Service: Cardiovascular;  Laterality: N/A;   BREAST BIOPSY Bilateral 2016   negative   BREAST 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/2015   DCIS, papillary carcinoma in situ   BREAST BIOPSY Left 05/27/2016   BENIGN MAMMARY EPITHELIUM   BREAST BIOPSY Left 11/20/2017   affirm bx x clip BENIGN MAMMARY EPITHELIUM CONSISTENT WITH RAD THERAPY   BREAST EXCISIONAL BIOPSY Right    NEG 1980's   BREAST LUMPECTOMY Left 10/17/2015   DCIS and papillary carcinoma insitu, clear margins   CARDIAC SURGERY     has replacement valve   CATARACT EXTRACTION Right    CATARACT EXTRACTION W/PHACO Left 01/16/2021   Procedure: CATARACT  EXTRACTION PHACO AND INTRAOCULAR LENS PLACEMENT (IOC) LEFT DIABETIC 8.46 00:59.8;  Surgeon: Galen Manila, MD;  Location: [facility];  Service: Ophthalmology;  Laterality: Left;   CHOLECYSTECTOMY     COLONOSCOPY WITH PROPOFOL N/A 03/11/2016   Procedure: COLONOSCOPY WITH PROPOFOL;  Surgeon: Scot Jun, MD;  Location: [facility] ENDOSCOPY;  Service: Endoscopy;  Laterality: N/A;   COLONOSCOPY WITH PROPOFOL N/A 02/18/2020   Procedure: COLONOSCOPY WITH PROPOFOL;  Surgeon: Midge Minium, MD;  Location: [facility]COPY;  Service: Endoscopy;  Laterality: N/A;   ESOPHAGOGASTRODUODENOSCOPY (EGD) WITH PROPOFOL N/A 03/11/2016   Procedure: ESOPHAGOGASTRODUODENOSCOPY (EGD) WITH PROPOFOL;  Surgeon: Scot Jun, MD;  Location: [facility] ENDOSCOPY;  Service: Endoscopy;  Laterality: N/A;   ESOPHAGOGASTRODUODENOSCOPY (EGD) WITH PROPOFOL N/A 02/18/2020   Procedure: ESOPHAGOGASTRODUODENOSCOPY (EGD) WITH PROPOFOL;  Surgeon: Midge Minium, MD;  Location: [facility] ENDOSCOPY;  Service: Endoscopy;  Laterality: N/A;   ESOPHAGOGASTRODUODENOSCOPY (EGD) WITH PROPOFOL N/A 04/25/2020   Procedure: ESOPHAGOGASTRODUODENOSCOPY (EGD) WITH PROPOFOL;  Surgeon: Midge Minium, MD;  Location: [facility] ENDOSCOPY;  Service: Endoscopy;  Laterality: N/A;   ESOPHAGOGASTRODUODENOSCOPY (EGD) WITH PROPOFOL N/A 05/23/2020   Procedure: ESOPHAGOGASTRODUODENOSCOPY (EGD) WITH PROPOFOL;  Surgeon: Midge Minium, MD;  Location: [facility] ENDOSCOPY;  Service: Endoscopy;  Laterality: N/A;   EUS N/A 10/04/2021   Procedure: LOWER  ENDOSCOPIC ULTRASOUND (EUS);  Surgeon: Doren Custard, MD;  Location: [facility] ENDOSCOPY;  Service: Gastroenterology;  Laterality: N/A;  [facility]   EYE SURGERY     FINGER ARTHROSCOPY WITH CARPOMETACARPEL (CMC) ARTHROPLASTY Right 09/03/2018   Procedure: CARPOMETACARPEL [facility]) ARTHROPLASTY RIGHT THUMB;  Surgeon: Kennedy Bucker, MD;  Location: [facility] ORS;  Service: Orthopedics;  Laterality: Right;   FLEXIBLE SIGMOIDOSCOPY N/A 09/14/2021    Procedure: FLEXIBLE SIGMOIDOSCOPY;  Surgeon: Midge Minium, MD;  Location: [facility] ENDOSCOPY;  Service: Endoscopy;  Laterality: N/A;  No anesthesia   GANGLION CYST EXCISION Right 09/03/2018   Procedure: REMOVAL GANGLION OF WRIST;  Surgeon: Kennedy Bucker, MD;  Location: [facility] ORS;  Service: Orthopedics;  Laterality: Right;   HARDWARE REMOVAL Right 09/03/2018   Procedure: HARDWARE REMOVAL RIGHT THUMB;  Surgeon: Kennedy Bucker, MD;  Location: [facility] ORS;  Service: Orthopedics;  Laterality: Right;  staple removed   JOINT REPLACEMENT Left    TKR   left sinusplasty      MASTECTOMY, PARTIAL Left 10/17/2015   Procedure: MASTECTOMY PARTIAL REVISION;  Surgeon: Nadeen Landau, MD;  Location: [facility] ORS;  Service: General;  Laterality: Left;   PACEMAKER IMPLANT N/A 03/23/2021   Procedure: PACEMAKER IMPLANT;  Surgeon: Lanier Prude, MD;  [facility]: [facility];  Service: Cardiovascular;  Laterality: N/A;   PARTIAL MASTECTOMY WITH NEEDLE LOCALIZATION Left 09/29/2015   Procedure: PARTIAL MASTECTOMY WITH NEEDLE LOCALIZATION;  Surgeon: Nadeen Landau, MD;  Location: [facility] ORS;  Service: General;  Laterality: Left;   RECTAL EXAM UNDER ANESTHESIA N/A 10/05/2021   Procedure: RECTAL EXAM UNDER ANESTHESIA, ASPIRATION OF RECTAL CYST;  Surgeon: Carolan Shiver, MD;  Location: [facility] ORS;  Service: General;  Laterality: N/A;   TOTAL ABDOMINAL HYSTERECTOMY W/ BILATERAL SALPINGOOPHORECTOMY      Prior to Admission medications   Medication Sig Start Date End Date Taking? Authorizing Provider  acetaminophen (TYLENOL) 325 MG tablet Take 1-2 tablets (325-650 mg total) by mouth every 4 (four) hours as needed for mild pain. 03/24/21   Barrett, Joline Salt, PA-C  Azelastine HCl 137 MCG/SPRAY SOLN INHALE 1-2 PUFFS IN EACH NOSTRIL NASALLY ONCE A DAY 30 DAY(S)    [provider]  Cholecalciferol (VITAMIN D) 50 MCG (2000 UT) CAPS Take 2,000 Units by mouth daily.    [provider]  clobetasol ointment  (TEMOVATE) 0.05 % Apply 1 application topically as needed (vaginal irritation).    [provider]  clotrimazole (LOTRIMIN) 1 % cream Apply 1 Application topically 2 (two) times daily as needed.    [provider]  cyanocobalamin (,VITAMIN B-12,) 1000 MCG/ML injection 1,000 mcg every 30 (thirty) days. 04/23/19   [provider]  cyclobenzaprine (FLEXERIL) 5 MG tablet Take 1 tablet by mouth 2 (two) times daily.    [provider]  dicyclomine (BENTYL) 10 MG capsule Take 10 mg by mouth in the morning, at noon, and at bedtime. 05/16/19   [provider]  docusate sodium (COLACE) 100 MG capsule Take 100 mg by mouth 3 (three) times daily as needed for moderate constipation. 01/10/20   [provider]  EPINEPHrine 0.3 mg/0.3 mL IJ SOAJ injection Inject 0.3 mg into the muscle as needed for anaphylaxis. 05/18/19   [provider]  esomeprazole (NEXIUM) 40 MG capsule Take 40 mg by mouth 2 (two) times daily before a meal.  02/08/16   [provider]  estradiol (ESTRACE) 0.1 MG/GM vaginal cream Place 1 Applicatorful vaginally daily as needed (irritation).    [provider]  fenofibrate 160 MG tablet Take 160 mg by mouth at bedtime.    [provider]  gabapentin (NEURONTIN) 300 MG capsule Take 300 mg by mouth at bedtime.  09/07/16 10/25/36  [provider]  hydrOXYzine (ATARAX/VISTARIL) 25 MG tablet Take 25 mg by mouth every 6 (six) hours as needed for anxiety, itching, nausea or vomiting.    [provider]  Insulin Degludec (TRESIBA) 100 UNIT/ML SOLN Inject 194 Units into the skin at bedtime. 03/19/21   Raj Janus, MD  levocetirizine (XYZAL) 5 MG tablet Take 5 mg by mouth every evening.    [provider]  levothyroxine (SYNTHROID, LEVOTHROID) 75 MCG tablet Take 75 mcg by mouth daily before breakfast.     [provider]  magnesium oxide (MAG-OX) 400 MG tablet Take 400 mg by mouth 2 (two)  times daily.     [provider]  MOUNJARO 7.5 MG/0.5ML Pen Inject 12.5 mg into the skin once a week. 02/15/22   [provider]  nadolol (CORGARD) 20 MG tablet Take 1 tablet (20 mg total) by mouth daily. 08/14/22   End, Cristal Deer, MD  NONFORMULARY OR COMPOUNDED ITEM Nifedipine 0.3% plus lidocaine 2% cream apply to rectum BID and after every BM.    [provider]  NOVOLOG FLEXPEN 100 UNIT/ML FlexPen Inject 48-68 Units into the skin 3 (three) times daily with meals. Based on sliding scale 08/14/19   [provider]  potassium chloride SA (K-DUR,KLOR-CON) 20 MEQ tablet Take 20 mEq by mouth 2 (two) times daily.     [provider]  saccharomyces boulardii (FLORASTOR) 250 MG capsule Take 250 mg by mouth 2 (two) times daily.     [provider]  simvastatin (ZOCOR) 20 MG tablet Take 20 mg by mouth at bedtime.    [provider]  spironolactone (ALDACTONE) 25 MG tablet TAKE 1/2 TABLET BY MOUTH DAILY 05/03/22   End, Cristal Deer, MD  torsemide (DEMADEX) 20 MG tablet TAKE 1 TABLET BY MOUTH EVERY DAY 03/02/21   End, Cristal Deer, MD  TRESIBA FLEXTOUCH 200 UNIT/ML FlexTouch Pen 194 Unit(s) SUB-Q Daily    [provider]  valACYclovir (VALTREX) 500 MG tablet Take 500 mg by mouth 2 (two) times daily as needed (fever blisters). 01/06/17   [provider]    Allergies as of 09/08/2022 - Review Complete 09/08/2022  Allergen Reaction Noted   Codeine Itching, Nausea And Vomiting, and Other (See Comments) 06/28/2014   Demeclocycline Rash 04/10/2012   Demerol [meperidine] Itching and Nausea And Vomiting 06/28/2014   Hydrocodone Itching and Nausea And Vomiting 08/24/2018   Other Other (See Comments) 04/10/2012   Oxycodone Itching and Nausea And Vomiting 09/26/2015   Pentazocine Itching, Nausea And Vomiting, and Other (See Comments) 04/10/2012   Tetracyclines & related Rash 06/28/2014   Coal tar extract Other (See Comments) 03/19/2022    Hydrocodone-acetaminophen Other (See Comments) 01/30/2018   Salicylic acid Other (See Comments) 04/02/2022   Tetracycline hcl Other (See Comments) 04/02/2022   Fentanyl Itching, Nausea And Vomiting, and Other (See Comments) 06/28/2014    Family History  Problem Relation Age of Onset   Breast cancer Paternal Grandmother    Colon cancer Father    Diabetes Sister    Diabetes Brother    Heart disease Brother    Prostate cancer Brother    Colon cancer Maternal Uncle    Prostate cancer Brother    Bladder Cancer Brother    Leukemia Mother        all  Ovarian cancer Neg Hx    Kidney cancer Neg Hx     Social History   Socioeconomic History   Marital status: Married    Spouse name: Chase Picket   Number of children: Not on file   Years of education: Not on file   Highest education level: Not on file  Occupational History   Not on file  Tobacco Use   Smoking status: Never   Smokeless tobacco: Never  Vaping Use   Vaping status: Never Used  Substance and Sexual Activity   Alcohol use: No    Alcohol/week: 0.0 standard drinks of alcohol   Drug use: No   Sexual activity: Not Currently    Birth control/protection: Surgical  Other Topics Concern   Not on file  Social History Narrative    Husband lives at nursing home , lives with husbands sister   Social Determinants of Health   Financial Resource Strain: Not on file  Food Insecurity: No Food Insecurity (09/09/2022)   Hunger Vital Sign    Worried About Running Out of Food in the Last Year: Never true    Ran Out of Food in the Last Year: Never true  Transportation Needs: No Transportation Needs (09/09/2022)   PRAPARE - Administrator, Civil Service (Medical): No    Lack of Transportation (Non-Medical): No  Physical Activity: Unknown (01/30/2018)   Received from [facility]   Exercise Vital Sign    Days of Exercise per Week: 7 days    Minutes of Exercise per Session: Not on file  Stress: Not on  file  Social Connections: Not on file  Intimate Partner Violence: Not At Risk (09/09/2022)   Humiliation, Afraid, Rape, and Kick questionnaire    Fear of Current or Ex-Partner: No    Emotionally Abused: No    Physically Abused: No    Sexually Abused: No    Review of Systems: See HPI, otherwise negative ROS  Physical Exam: BP (!) 105/57 (BP Location: Right Arm)   Pulse 75   Temp 98.2 F (36.8 C) (Oral)   Resp 16   Ht 5\' 3"  (1.6 m)   Wt 63 kg   LMP  (LMP Unknown)   SpO2 98%   BMI 24.62 kg/m  General:   Alert,  pleasant and cooperative in NAD Head:  Normocephalic and atraumatic. Neck:  Supple; no masses or thyromegaly. Lungs:  Clear throughout to auscultation, normal respiratory effort.    Heart:  +S1, +S2, Regular rate and rhythm, No edema. Abdomen:  Soft, nontender and nondistended. Normal bowel sounds, without guarding, and without rebound.   Neurologic:  Alert and  oriented x4;  grossly normal neurologically.  Impression/Plan: Annette Foster is here for an endoscopy and colonoscopy  to be performed for  evaluation of hematochezia.     Risks, benefits, limitations, and alternatives regarding endoscopy have been reviewed with the patient.  Questions have been answered.  All parties agreeable.   Wyline Mood, MD  09/10/2022, 10:51 AM

## 2022-09-10 NOTE — Care Plan (Signed)
Called to check on patient due to small volume hematochezia and abdominal pain. Patient is hemodynamically stable and looks very well. Blood is likely oozing from polypectomy sites given low platelets and epigastric pain likely from variceal bands. No hematemesis. Recommend frequent anti emetics to prevent any wretching. Will let Dr. Tobi Bastos know to check on her in the morning.  Merlyn Lot MD, MPH [facility]

## 2022-09-10 NOTE — Op Note (Signed)
[facility] Gastroenterology Patient Name: Annette Foster Procedure Date: 09/10/2022 11:28 AM MRN: [id number] Account #: [id number] Date of Birth: 12/19/1940 Admit Type: Outpatient Age: 82 Room: [facility] ENDO ROOM 2 Gender: Female Note Status: Finalized Instrument Name: Upper Endoscope [id number] Procedure:             Upper GI endoscopy Indications:           Hematochezia Providers:             Wyline Mood MD, MD Medicines:             Monitored Anesthesia Care Complications:         No immediate complications. Procedure:             Pre-Anesthesia Assessment:                        - Prior to the procedure, a History and Physical was                         performed, and patient medications, allergies and                         sensitivities were reviewed. The patient's tolerance                         of previous anesthesia was reviewed.                        - The risks and benefits of the procedure and the                         sedation options and risks were discussed with the                         patient. All questions were answered and informed                         consent was obtained.                        - ASA Grade Assessment: II - A patient with mild                         systemic disease.                        After obtaining informed consent, the endoscope was                         passed under direct vision. Throughout the procedure,                         the patient's blood pressure, pulse, and oxygen                         saturations were monitored continuously. The                         Endosonoscope was introduced through the mouth, and  advanced to the third part of duodenum. The upper GI                         endoscopy was accomplished with ease. The patient                         tolerated the procedure well. Findings:      The examined duodenum was normal.      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 They       were large in size. had red whale sign Two bands were successfully       placed with complete eradication, resulting in deflation of varices.       There was no bleeding during and at the end of the procedure.      The cardia and gastric fundus were normal on retroflexion. Impression:            - Normal examined duodenum.                        - Portal hypertensive gastropathy.                        - Grade III esophageal varices. Completely eradicated.                         Banded.                        - No specimens collected. Recommendation:        - Repeat upper endoscopy in 4 weeks for retreatment.                        - witth Dr Servando Snare                        - Perform a colonoscopy today. Procedure Code(s):     --- Professional ---                        [id number], Esophagogastroduodenoscopy, flexible,                         transoral; with band ligation of esophageal/gastric                         varices Diagnosis Code(s):     --- Professional ---                        K76.6, Portal hypertension                        K31.89, Other diseases of stomach and duodenum                        I85.00, Esophageal varices without bleeding                        K92.1, Melena (includes Hematochezia) CPT copyright 2022 American Medical Association. All rights reserved. The codes documented in this report  are preliminary and upon coder review may  be revised to meet current compliance requirements. Wyline Mood, MD Wyline Mood MD, MD 09/10/2022 11:40:39 AM This report has been signed electronically. Number of Addenda: 0 Note Initiated On: 09/10/2022 11:28 AM Estimated Blood Loss:  Estimated blood loss: none.      [facility]

## 2022-09-10 NOTE — Progress Notes (Signed)
Patient was able to drink 2L of the Nulytely in preparation for the EGD/colonoscopy. Stool is clear liquid. She has been NPO since midnight. She wasn't able to drink the entire 4L that was ordered.  RN notified Dr. Arville Care and Dr. Tobi Bastos of the above information

## 2022-09-10 NOTE — Inpatient Diabetes Management (Signed)
Inpatient Diabetes Program Recommendations  AACE/ADA: New Consensus Statement on Inpatient Glycemic Control (2015)  Target Ranges:  Prepandial:   less than 140 mg/dL      Peak postprandial:   less than 180 mg/dL (1-2 hours)      Critically ill patients:  140 - 180 mg/dL   Lab Results  Component Value Date   GLUCAP 179 (H) 09/10/2022   HGBA1C 7.2 (H) 09/09/2022    Latest Reference Range & Units 09/09/22 15:13 09/09/22 19:35 09/10/22 00:31 09/10/22 00:50 09/10/22 04:56 09/10/22 05:26 09/10/22 08:05 09/10/22 11:09 09/10/22 12:14  Glucose-Capillary 70 - 99 mg/dL 94 161 (H) 90 81 56 (L) 167 (H) 81 66 (L) 179 (H)  (H): Data is abnormally high (L): Data is abnormally low  Review of Glycemic Control  Diabetes history: DM2 Outpatient Diabetes medications: Tresiba 194 units every day, Novolog 48-68 units tid meal coverage, Mounjaro 12.5 mg q week Current orders for Inpatient glycemic control: Semglee 60 units every day, Novolog 0-15 units q 4 hrs.  Inpatient Diabetes Program Recommendations:   Noted decrease in Semglee to 60 units. Please consider: -Decrease Novolog correction to 0-9 units q 4 hrs. While NPO  Thank you, Annette Foster. Keagon Glascoe, RN, MSN, CDE  Diabetes Coordinator Inpatient Glycemic Control Team Team Pager [phone] (8am-5pm) 09/10/2022 12:46 PM

## 2022-09-10 NOTE — Transfer of Care (Signed)
Immediate Anesthesia Transfer of Care Note  Patient: Annette Foster  Procedure(s) Performed: ESOPHAGOGASTRODUODENOSCOPY (EGD) WITH PROPOFOL COLONOSCOPY WITH PROPOFOL  Patient Location: PACU  Anesthesia Type:General  Level of Consciousness: drowsy  Airway & Oxygen Therapy: Patient Spontanous Breathing  Post-op Assessment: Report given to RN and Post -op Vital signs reviewed and stable  Post vital signs: Reviewed and stable  Last Vitals:  Vitals Value Taken Time  BP 110/48 09/10/22 1206  Temp 36 C 09/10/22 1206  Pulse 74 09/10/22 1213  Resp 17 09/10/22 1213  SpO2 96 % 09/10/22 1213  Vitals shown include unfiled device data.  Last Pain:  Vitals:   09/10/22 1206  TempSrc: Temporal  PainSc: Asleep         Complications: No notable events documented.

## 2022-09-10 NOTE — Progress Notes (Addendum)
Critical CBG 56  RN notified Dr. Arville Care of the above information  Orders: 1amp Dextrose 50%  Interventions: administered 1amp Dextrose 50%  RN will recheck CBG 15 min after intervention  Repeat CBG 167

## 2022-09-10 NOTE — Op Note (Signed)
[facility] Gastroenterology Patient Name: Annette Foster Procedure Date: 09/10/2022 11:28 AM MRN: [id number] Account #: [id number] Date of Birth: 1940-02-16 Admit Type: Outpatient Age: 82 Room: [facility] ENDO ROOM 2 Gender: Female Note Status: Finalized Instrument Name: Prentice Docker [id number] Procedure:             Colonoscopy Indications:           Hematochezia Providers:             Wyline Mood MD, MD Medicines:             Monitored Anesthesia Care Complications:         No immediate complications. Procedure:             Pre-Anesthesia Assessment:                        - Prior to the procedure, a History and Physical was                         performed, and patient medications, allergies and                         sensitivities were reviewed. The patient's tolerance                         of previous anesthesia was reviewed.                        - The risks and benefits of the procedure and the                         sedation options and risks were discussed with the                         patient. All questions were answered and informed                         consent was obtained.                        - ASA Grade Assessment: II - A patient with mild                         systemic disease.                        After obtaining informed consent, the colonoscope was                         passed under direct vision. Throughout the procedure,                         the patient's blood pressure, pulse, and oxygen                         saturations were monitored continuously. The                         Colonoscope was introduced through the anus and  advanced to the the cecum, identified by the                         appendiceal orifice. The colonoscopy was performed                         with ease. The patient tolerated the procedure well.                         The quality of the bowel preparation was excellent.                          The ileocecal valve, appendiceal orifice, and rectum                         were photographed. Findings:      The perianal and digital rectal examinations were normal.      Two sessile polyps were found in the descending colon. The polyps were 6       to 8 mm in size. These polyps were removed with a cold snare. Resection       and retrieval were complete. To prevent bleeding after the polypectomy,       three hemostatic clips were successfully placed. applied purastat to the       area for some resdiual oozing      A 5 mm polyp was found in the ascending colon. The polyp was sessile.       The polyp was removed with a cold snare. Resection and retrieval were       complete.      The exam was otherwise without abnormality. Impression:            - Two 6 to 8 mm polyps in the descending colon,                         removed with a cold snare. Resected and retrieved.                         Clips were placed.                        - One 5 mm polyp in the ascending colon, removed with                         a cold snare. Resected and retrieved.                        - The examination was otherwise normal. Recommendation:        - Return patient to hospital ward for ongoing care.                        - NPO for 2 hours.                        - suspect variceal bleed                        complete antibiotics for 5 days  Octreotide for 72 hours                        Follow up with Dr Amadeo Garnet as an outpatient Procedure Code(s):     --- Professional ---                        [phone], Colonoscopy, flexible; with removal of                         tumor(s), polyp(s), or other lesion(s) by snare                         technique Diagnosis Code(s):     --- Professional ---                        D12.4, Benign neoplasm of descending colon                        D12.2, Benign neoplasm of ascending colon                        K92.1, Melena (includes  Hematochezia) CPT copyright 2022 American Medical Association. All rights reserved. The codes documented in this report are preliminary and upon coder review may  be revised to meet current compliance requirements. Wyline Mood, MD Wyline Mood MD, MD 09/10/2022 12:04:49 PM This report has been signed electronically. Number of Addenda: 0 Note Initiated On: 09/10/2022 11:28 AM Scope Withdrawal Time: 0 hours 15 minutes 27 seconds  Total Procedure Duration: 0 hours 19 minutes 38 seconds  Estimated Blood Loss:  Estimated blood loss: none.      [facility]

## 2022-09-11 ENCOUNTER — Inpatient Hospital Stay: Payer: Medicare PPO

## 2022-09-11 ENCOUNTER — Encounter: Payer: Self-pay | Admitting: Gastroenterology

## 2022-09-11 DIAGNOSIS — J189 Pneumonia, unspecified organism: Secondary | ICD-10-CM | POA: Diagnosis present

## 2022-09-11 DIAGNOSIS — J690 Pneumonitis due to inhalation of food and vomit: Secondary | ICD-10-CM | POA: Diagnosis present

## 2022-09-11 DIAGNOSIS — G9341 Metabolic encephalopathy: Secondary | ICD-10-CM | POA: Diagnosis not present

## 2022-09-11 DIAGNOSIS — Z794 Long term (current) use of insulin: Secondary | ICD-10-CM | POA: Diagnosis not present

## 2022-09-11 DIAGNOSIS — K922 Gastrointestinal hemorrhage, unspecified: Secondary | ICD-10-CM | POA: Diagnosis not present

## 2022-09-11 DIAGNOSIS — E11649 Type 2 diabetes mellitus with hypoglycemia without coma: Secondary | ICD-10-CM

## 2022-09-11 LAB — BLOOD GAS, ARTERIAL
Acid-Base Excess: 1.2 mmol/L (ref 0.0–2.0)
Bicarbonate: 26 mmol/L (ref 20.0–28.0)
FIO2: 21 %
O2 Saturation: 97.3 %
Patient temperature: 37
pCO2 arterial: 41 mmHg (ref 32–48)
pH, Arterial: 7.41 (ref 7.35–7.45)
pO2, Arterial: 80 mmHg — ABNORMAL LOW (ref 83–108)

## 2022-09-11 LAB — CULTURE, BLOOD (ROUTINE X 2)
Culture: NO GROWTH
Culture: NO GROWTH
Special Requests: ADEQUATE
Special Requests: ADEQUATE

## 2022-09-11 LAB — URINALYSIS, COMPLETE (UACMP) WITH MICROSCOPIC
Bacteria, UA: NONE SEEN
Bilirubin Urine: NEGATIVE
Glucose, UA: NEGATIVE mg/dL
Hgb urine dipstick: NEGATIVE
Ketones, ur: NEGATIVE mg/dL
Leukocytes,Ua: NEGATIVE
Nitrite: NEGATIVE
Protein, ur: NEGATIVE mg/dL
Specific Gravity, Urine: 1.003 — ABNORMAL LOW (ref 1.005–1.030)
pH: 5 (ref 5.0–8.0)

## 2022-09-11 LAB — GLUCOSE, CAPILLARY
Glucose-Capillary: 110 mg/dL — ABNORMAL HIGH (ref 70–99)
Glucose-Capillary: 134 mg/dL — ABNORMAL HIGH (ref 70–99)
Glucose-Capillary: 168 mg/dL — ABNORMAL HIGH (ref 70–99)
Glucose-Capillary: 168 mg/dL — ABNORMAL HIGH (ref 70–99)
Glucose-Capillary: 172 mg/dL — ABNORMAL HIGH (ref 70–99)
Glucose-Capillary: 180 mg/dL — ABNORMAL HIGH (ref 70–99)
Glucose-Capillary: 301 mg/dL — ABNORMAL HIGH (ref 70–99)
Glucose-Capillary: 39 mg/dL — CL (ref 70–99)
Glucose-Capillary: 56 mg/dL — ABNORMAL LOW (ref 70–99)
Glucose-Capillary: 92 mg/dL (ref 70–99)
Glucose-Capillary: 99 mg/dL (ref 70–99)

## 2022-09-11 LAB — AMMONIA: Ammonia: 44 umol/L — ABNORMAL HIGH (ref 9–35)

## 2022-09-11 LAB — CBC
HCT: 26.6 % — ABNORMAL LOW (ref 36.0–46.0)
Hemoglobin: 8.7 g/dL — ABNORMAL LOW (ref 12.0–15.0)
MCH: 30.3 pg (ref 26.0–34.0)
MCHC: 32.7 g/dL (ref 30.0–36.0)
MCV: 92.7 fL (ref 80.0–100.0)
Platelets: 91 10*3/uL — ABNORMAL LOW (ref 150–400)
RBC: 2.87 MIL/uL — ABNORMAL LOW (ref 3.87–5.11)
RDW: 13.8 % (ref 11.5–15.5)
WBC: 2.7 10*3/uL — ABNORMAL LOW (ref 4.0–10.5)
nRBC: 0 % (ref 0.0–0.2)

## 2022-09-11 LAB — BASIC METABOLIC PANEL
Anion gap: 6 (ref 5–15)
BUN: 11 mg/dL (ref 8–23)
CO2: 25 mmol/L (ref 22–32)
Calcium: 8.3 mg/dL — ABNORMAL LOW (ref 8.9–10.3)
Chloride: 100 mmol/L (ref 98–111)
Creatinine, Ser: 0.72 mg/dL (ref 0.44–1.00)
GFR, Estimated: >60 mL/min (ref >60)
Glucose, Bld: 174 mg/dL — ABNORMAL HIGH (ref 70–99)
Potassium: 3.5 mmol/L (ref 3.5–5.1)
Sodium: 131 mmol/L — ABNORMAL LOW (ref 135–145)

## 2022-09-11 LAB — TSH: TSH: 0.466 u[IU]/mL (ref 0.350–4.500)

## 2022-09-11 LAB — VITAMIN B12: Vitamin B-12: 949 pg/mL — ABNORMAL HIGH (ref 180–914)

## 2022-09-11 LAB — D-DIMER, QUANTITATIVE: D-Dimer, Quant: 16.04 ug/mL-FEU — ABNORMAL HIGH (ref 0.00–0.50)

## 2022-09-11 LAB — TROPONIN I (HIGH SENSITIVITY)
Troponin I (High Sensitivity): 9 ng/L (ref <18)
Troponin I (High Sensitivity): 8 ng/L (ref <18)

## 2022-09-11 LAB — RPR: RPR Ser Ql: NONREACTIVE

## 2022-09-11 LAB — FOLATE: Folate: 26 ng/mL (ref >5.9)

## 2022-09-11 MED ORDER — LACTULOSE 10 GM/15ML PO SOLN
30.0000 g | Freq: Two times a day (BID) | ORAL | Status: DC
  Administered 2022-09-11 – 2022-09-12 (×3): 30 g via ORAL
  Filled 2022-09-11 (×4): qty 60

## 2022-09-11 MED ORDER — LACTULOSE 10 GM/15ML PO SOLN: 30.0000 g | Freq: Three times a day (TID) | ORAL | Status: DC

## 2022-09-11 MED ORDER — IOHEXOL 350 MG/ML SOLN
75.0000 mL | Freq: Once | INTRAVENOUS | Status: AC | PRN
  Administered 2022-09-11: 55 mL via INTRAVENOUS

## 2022-09-11 MED ORDER — DEXTROSE 50 % IV SOLN
INTRAVENOUS | Status: AC
  Administered 2022-09-11: 50 mL via INTRAVENOUS
  Filled 2022-09-11: qty 50

## 2022-09-11 MED ORDER — DEXTROSE 50 % IV SOLN: 1.0000 | Freq: Once | INTRAVENOUS | Status: AC

## 2022-09-11 MED ORDER — DEXTROSE 5 % IV SOLN: INTRAVENOUS | Status: DC

## 2022-09-11 NOTE — Anesthesia Postprocedure Evaluation (Signed)
Anesthesia Post Note  Patient: Annette Foster  Procedure(s) Performed: ESOPHAGOGASTRODUODENOSCOPY (EGD) WITH PROPOFOL COLONOSCOPY WITH PROPOFOL POLYPECTOMY HEMOSTASIS CLIP PLACEMENT ESOPHAGEAL BANDING HEMOSTASIS CONTROL  Patient location during evaluation: Endoscopy Anesthesia Type: General Level of consciousness: awake and alert Pain management: pain level controlled Vital Signs Assessment: post-procedure vital signs reviewed and stable Respiratory status: spontaneous breathing, nonlabored ventilation and respiratory function stable Cardiovascular status: blood pressure returned to baseline and stable Postop Assessment: no apparent nausea or vomiting Anesthetic complications: no   No notable events documented.   Last Vitals:  Vitals:   09/11/22 1100 09/11/22 1701  BP: 116/66 103/88  Pulse: 75 76  Resp:  18  Temp: 36.6 C   SpO2:  95%    Last Pain:  Vitals:   09/11/22 0800  TempSrc:   PainSc: 0-No pain                 Foye Deer

## 2022-09-11 NOTE — Plan of Care (Addendum)
Patient is alert and orientate x 4.  Blood sugars are on low side. Advanced  her diet to full liquid diet. Tolerated well. Had a BM x 2 with small blood clots. Continue on 5% dextrose and octreotide . Vitals sign with in normal limits. We will continue to monitor.   Problem: Education: Goal: Ability to describe self-care measures that may prevent or decrease complications (Diabetes Survival Skills Education) will improve Outcome: Progressing Goal: Individualized Educational Video(s) Outcome: Progressing   Problem: Coping: Goal: Ability to adjust to condition or change in health will improve Outcome: Progressing   Problem: Fluid Volume: Goal: Ability to maintain a balanced intake and output will improve Outcome: Progressing   Problem: Health Behavior/Discharge Planning: Goal: Ability to identify and utilize available resources and services will improve Outcome: Progressing Goal: Ability to manage health-related needs will improve Outcome: Progressing   Problem: Metabolic: Goal: Ability to maintain appropriate glucose levels will improve Outcome: Progressing   Problem: Nutritional: Goal: Maintenance of adequate nutrition will improve Outcome: Progressing Goal: Progress toward achieving an optimal weight will improve Outcome: Progressing   Problem: Skin Integrity: Goal: Risk for impaired skin integrity will decrease Outcome: Progressing   Problem: Tissue Perfusion: Goal: Adequacy of tissue perfusion will improve Outcome: Progressing   Problem: Education: Goal: Knowledge of General Education information will improve Description: Including pain rating scale, medication(s)/side effects and non-pharmacologic comfort measures Outcome: Progressing   Problem: Health Behavior/Discharge Planning: Goal: Ability to manage health-related needs will improve Outcome: Progressing   Problem: Clinical Measurements: Goal: Ability to maintain clinical measurements within normal limits will  improve Outcome: Progressing Goal: Will remain free from infection Outcome: Progressing Goal: Diagnostic test results will improve Outcome: Progressing Goal: Respiratory complications will improve Outcome: Progressing Goal: Cardiovascular complication will be avoided Outcome: Progressing   Problem: Activity: Goal: Risk for activity intolerance will decrease Outcome: Progressing   Problem: Nutrition: Goal: Adequate nutrition will be maintained Outcome: Progressing   Problem: Coping: Goal: Level of anxiety will decrease Outcome: Progressing   Problem: Elimination: Goal: Will not experience complications related to bowel motility Outcome: Progressing Goal: Will not experience complications related to urinary retention Outcome: Progressing   Problem: Pain Managment: Goal: General experience of comfort will improve Outcome: Progressing   Problem: Safety: Goal: Ability to remain free from injury will improve Outcome: Progressing   Problem: Skin Integrity: Goal: Risk for impaired skin integrity will decrease Outcome: Progressing

## 2022-09-11 NOTE — Progress Notes (Signed)
Patient had EGD/colonoscopy with varices banding and polyp removal with banding. Tonight, the patient had dark red blood clots pass from her rectum and felt as though she would passout. RN assisted patient back to bed from the BR. Patient's dizziness resolved with lying down. BP 131/71 when patient was safely back in the bed.   Patient  states she is very shaky and feels like she is currently bleeding now. RN assessed for new blood once patient was back in the bed, No blood noted.   2101 - CBG 106  RN notified Dr. Mia Creek and Dr. Arville Care of the above information.   Orders Received: H&H, NS bolus.   Patient then began complaining of severe abdominal pain and nausea. Dr. Mia Creek and Dr. Arville Care were notified.   Orders Received: prn Tramadol per patient preference.

## 2022-09-11 NOTE — Plan of Care (Signed)

## 2022-09-11 NOTE — Significant Event (Signed)
Rapid Response Event Note   Reason for Call :   Hypoglycemia AMS   Initial Focused Assessment:  Patient with Delayed speech and very drowsy on rapid nurses arrival. Patients vitals stable  BP 115/57 HR 75 RR 19 O2 99% on RA. Patient was given an AMP of D50 for a Blood sugar of 39 prior to the rapid. Blood glucose on recheck 180. Patient able to answer questions and follow simple commands.   Questionable left gaze preference on assignment. Dr Arville Care at bedside. Code stroke called by Dr. Arville Care.   Delayed speech compeletly resolved by the time patient got to CT. Patient still slightly drowsy but no trouble following commands   Follow up 0320 Patient drowsy but awakens to voice and follows commands. Vital signs stable. Primary nurse Junious Dresser instructed to reach out to the rapid nurse with any changes/concerns    Interventions:  -Labs -CT scan -D5 infusion added     MD Notified: 0049 Call Time:0049 Arrival Time:0054 End Time:0150  Judyann Munson, RN

## 2022-09-11 NOTE — Progress Notes (Signed)
0109 Code Stroke cart activated- Inpt admitted 7/30 for GI bleed- pt had EGD/colonoscopy with clips placed for grade 3 varacies and 3 polyps removed. Pt continues to have blood clots from rectum per bedside RN. Pt had hypotensive episode earlier tonight while going to bathroom and was given IV fluids. LKW MN, at 0015 while RN at bedside she had AMS and glucose was checked which was 38. She was given 1 amp D50 and repeat glucose was 180. BP 114/61. Pt in CT when cart activated. 1610 TS paged 0120 Dr Roselind Messier Chinita Greenland ) on camera, assessed pt in CT. Felt sx r/t GI bleeding , low glucose earlier, and low BP earlier. To update attending with POC.

## 2022-09-11 NOTE — Progress Notes (Addendum)
Progress Note   Patient: Annette Foster NGE:[id number] DOB: 07/24/1940 DOA: 09/08/2022     1 DOS: the patient was seen and examined on 09/11/2022   Brief hospital course: Ashmita Kimbrough is a 82 y.o. 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  She notes that when she woke up this morning, prior to going to church, she has spontaneous rectal bleeding.  She put on a pad and went to church.  She subsequently went through 3 other pads and underwear due to the bleeding.  She has had bleeding like this before when it was associated with a variceal bleed.  She has been taking her medications including nadolol, spironolactone and torsemide.  She has not had any nausea or hematemesis.  She has not had any constipation or straining.  She has a history of NASH with cirrhosis and follows with Dr. Servando Snare.  Further symptoms include mild rectal pain.  She had an interesting history of peri-anal rectal mass, possible abscess in 2023 (seen on CT scan) and had a sigmoidoscopy around that time as well.  However, bleeding resolved on its own and she has not had issues until this time.  She reports not taking any anticoagulation including aspirin.  She is not taking NSAIDs.    Last colonoscopy showed friable colon, internal hemorrhoids in 2022   Last EGD also in 2022 showed grade III varices and portal hypertensive gastropathy.  Banding was done at that time.    ED Course: In the ED, she was noted to have a Cr of 1.02, normal BUN, Glucose of 251, Alb of 3.3, WBC was low at 2.4 (chronic), H/H were 9.2 and 28.4 which is only slightly lower than baseline.  Platelets were 84 which is also chronic.  She had a CT  angio of the abdomen which did not show a source of bleeding.    Review of Systems: As per HPI otherwise all other systems reviewed and are negative.    Assessment and Plan: Lower GI bleed Hematochezia Gastrointestinal hemorrhage - DDx includes diverticular bleeding (not seen on colonoscopy in the past), hemorrhoidal bleeding (most likely)  -- Patient has a known history of liver cirrhosis secondary to NASH - Trend H/H, no need for transfusion  --hgb 9.2--9.5--8.4 -Appreciate GI consult, Dr Anna--recommends Egd/colonoscopy - She is not taking anticoagulation, antiplatelets or NSAIDs - CTA of the abdomen did not show any acute bleeding.  - cont PPI --EGD--- Normal examined duodenum.                        - Portal hypertensive gastropathy.                        - Grade III esophageal varices. Completely eradicated.                         Banded.                        - No specimens collected. --colonoscopy Two 6 to 8 mm polyps in the descending colon,  removed with a cold snare. Resected and retrieved.                         Clips were placed.                        - One 5 mm polyp in the ascending colon, removed with                         a cold snare. Resected and retrieved. 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) Portal hypertension (HCC) - She has known cirrhosis, following with GI and getting ultrasounds and AFPs every 6 months - She has external hemorrhoids on exam, known varices - Monitor for change in H/H, transfuse as needed - Hold fluids given ascites - Consider paracentesis if ascites worsens - Continue nadolol, torsemide, spironolactone   Other pancytopenia (HCC) Iron deficiency - Chronic, 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 - She is on VERY high doses of insulin, confirmed with patient -Will discontinue Lantus and sliding scale coverage for now     GERD (gastroesophageal reflux disease) - Continue PPI    Chronic heart failure with preserved ejection fraction (HFpEF) (HCC) Pacemaker  --continue nadolol - Pacemaker pocket appears clean, dry, no erythema    Abdominal pain Patient has diffuse abdominal pain and is tender on palpation. Will obtain abdominal x-ray to assess for free air since she just had a procedure done. If imaging is negative, will advance diet to full liquid    Right upper lobe infiltrate Concerning for aspiration pneumonia Patient had a CT angiogram of the chest to rule out a PE and it showed a large right upper lobe airspace opacity with air bronchograms is noted most consistent with pneumonia. Continue IV Rocephin the patient is getting  for SBP prophylaxis        Subjective: Patient is examined at the bedside.  Events of last night noted.  Patient had an episode of symptomatic hypoglycemia with blood sugar of 39.  She was noted to be very somnolent but arousable.  Rapid response was called.  Patient complained of abdominal pain and had an upper and lower endoscopy earlier in the day.  She is more awake and alert this morning.  Continues to complain of diffuse abdominal pain.  Physical Exam: Vitals:   09/11/22 0322 09/11/22 0657 09/11/22 0756 09/11/22 1100  BP: (!) 109/57 (!) 103/58 106/60 116/66  Pulse: 74 80 74 75  Resp: 14 14 18    Temp: 97.8 F (36.6 C) 97.8 F (36.6 C) 97.7 F (36.5 C) 97.9 F (36.6 C)  TempSrc: Oral Oral    SpO2: 97% 95% 100%   Weight:      Height:       Physical Exam Vitals and nursing  note reviewed.  Constitutional:      Appearance: Normal appearance.  HENT:     Head: Normocephalic and atraumatic.     Nose: Nose normal.     Mouth/Throat:  Mouth: Mucous membranes are moist.  Eyes:     Comments: Pale conjunctiva   Cardiovascular:     Rate and Rhythm: Normal rate and regular rhythm.  Pulmonary:     Effort: Pulmonary effort is normal.     Breath sounds: Normal breath sounds.  Abdominal:     General: Abdomen is flat. Bowel sounds are normal.     Tenderness: There is abdominal tenderness.     Comments: Firm and diffusely tender  Musculoskeletal:        General: Normal range of motion.     Cervical back: Normal range of motion and neck supple.  Skin:    General: Skin is warm and dry.  Neurological:     General: No focal deficit present.     Mental Status: She is alert.  Psychiatric:        Mood and Affect: Mood normal.        Behavior: Behavior normal.     Data Reviewed: Labs reviewed.  Ammonia 44, hemoglobin 8.6, vitamin B12 949 There are no new results to review at this time.  Family Communication: Plan of care discussed with patient  Disposition: Status is: Inpatient Remains inpatient appropriate because: Remains on octreotide drip and management of hypoglycemia  Planned Discharge Destination: Home with Home Health    Time spent: 40 minutes  Author: Lucile Shutters, MD 09/11/2022 12:49 PM  For on call review [email or web address].

## 2022-09-11 NOTE — Progress Notes (Addendum)
CRITICAL VALUE: CBG 39  RN gave the patient 8 ounces of regular soda and [place] ice. She was feeling drowsy and sweaty with no other symptoms. She has not received insulin but has been on octreotide drip.   RN notified Dr. Arville Care of the above information.   Orders Received: 1 amp D50  Patient became increasingly more lethargic and not able to hold her eyes open or cary on a conversation. Patient is oriented x4 but with slow speech and delayed responses.  Rapid response called. (See Rapid response RN note.)

## 2022-09-11 NOTE — Progress Notes (Addendum)
Critical care note:  Date of note: 09/11/2022  Subjective: The patient was hypoglycemic earlier with a blood glucose of 39.  She was given 8 ounces of regular soda and [place] ice.  She was feeling drowsy and sweaty with no other symptoms.  She has not received insulin but has been on octreotide drip.  I ordered an amp of D50.  Blood glucose was up to 180 however patient's mental status did not improve much in fact she became more drowsy and not holding eyes open.  Rapid response was called.  She denies chest pain or palpitations.  No cough or wheezing or hemoptysis.  No nausea or vomiting however she was having epigastric abdominal pain.  No fever or chills.  Objective: Physical examination: Generally: Acutely ill elderly Caucasian female in no acute respiratory distress.  She was very somnolent but arousable and would barely open her eyes with slow response to questions.  She was alert and oriented x 3 though. Vital signs per history of present illness. Head - atraumatic, normocephalic.  Pupils - equal, round and reactive to light and accommodation. Extraocular movements are intact. No scleral icterus.  Oropharynx - moist mucous membranes and tongue. No pharyngeal erythema or exudate.  Neck - supple. No JVD. Carotid pulses 2+ bilaterally. No carotid bruits. No palpable thyromegaly or lymphadenopathy. Cardiovascular - regular rate and rhythm. Normal S1 and S2. No murmurs, gallops or rubs.  Lungs - clear to auscultation bilaterally.  Abdomen - soft and nontender. Positive bowel sounds. No palpable organomegaly or masses.  Extremities - no pitting edema, clubbing or cyanosis.  Neuro - grossly non-focal.  Cranial nerves II to XII are grossly intact except for questionable left gaze preference with slow speech and slow answers to questions.  Muscle strength are equal 5/5 in both upper and lower extremities.  Normal sensory exam to light touch.  Gait was not tested. Skin - no rashes. Breast, pelvic and  rectal - deferred.  Labs and notes were reviewed.  Assessment/plan: 1.  Acute encephalopathy, rule out CVA, partly related to hypoglycemia. - Stat code stroke head CT scan was ordered and came back negative.  This was discussed with the radiologist. - Teleneurology consult was obtained the patient was started to have metabolic encephalopathy. - We will continue following neurochecks every 4 hours for 24 hours. - Will obtain CBC, CMP, follow-up blood glucose, urine drug screen, blood cultures, troponin I, TSH, RPR, ammonia level, B12 and folate as well as troponin I.  We had a D-dimer that came back elevated and therefore will obtain chest CTA this a.m. - Will avoid sedatives. - We will continue monitoring. - We will hold off long-acting insulin with [place].  Will hold off Mounjaro as well.  Authorized and performed by: Valente David, MD Total critical care time:    35    minutes. Due to a high probability of clinically significant, life-threatening deterioration, the patient required my highest level of preparedness to intervene emergently and I personally spent this critical care time directly and personally managing the patient.  This critical care time included obtaining a history, examining the patient, pulse oximetry, ordering and review of studies, arranging urgent treatment with development of management plan, evaluation of patient's response to treatment, frequent reassessment, and discussions with other providers. This critical care time was performed to assess and manage the high probability of imminent, life-threatening deterioration that could result in multiorgan failure.  It was exclusive of separately billable procedures and treating other patients and teaching  time.

## 2022-09-11 NOTE — Consult Note (Signed)
[facility] Consult Services   Patient Name:   Annette Foster, Annette Foster Date of Birth:   [birth year] Identification Number:   MRN - [id number] Date of Service:   09/11/2022 01:13:37  Diagnosis:       G93.41 - Encephalopathy Metabolic  Impression:      Patient with global encephalopathy likely secondary to toxic-metabolic encephalopathy suspected secondary to multifactorial causes such as hypoglycemia, hypotetion, infection, electrolyte derangements, malnutrition and medication effect.  CT Head with no acute findings per my review pending radiology read      Recommendations:  - Medical workup for toxic-metabolic etiology per primary consider as follows: UDS, BCx, UA, CBC, CMP, Glucose, TSH, RPR, Ammonia, B12, Folate, ABG, CXR  - Limit any unnecessary sedating medications, including benzo's, opiates, anesthesia, etc.  - Precautions: Seizure, Fall, and Universal Delirium     ------------------------------------------------------------------------------  Advanced Imaging: Advanced Imaging Deferred because:  Non-disabling symptoms as verified by the patient; no cortical signs so not consistent with LVO   Metrics: Last Known Well: 09/11/2022 00:00:00 [facility] Notification Time: 09/11/2022 01:13:37 Stamp Time: 09/11/2022 01:13:37 Initial Response Time: 09/11/2022 01:19:48 Symptoms: ams. Initial patient interaction: 09/11/2022 01:28:22 NIHSS Assessment Completed: 09/11/2022 01:29:42 Patient is not a candidate for Thrombolytic. Thrombolytic Medical Decision: 09/11/2022 01:29:43 Patient was not deemed candidate for Thrombolytic because of following reasons: GI Bleeding (Within 21 Days).  I personally Reviewed the CT Head and it Showed naf.  Primary Provider Notified of Diagnostic Impression and Management Plan on: 09/11/2022 01:46:45 Spoke With: mazzan Able to Reach Attempted to reach the consulting physician but was  unsuccessful    ------------------------------------------------------------------------------  History of Present Illness: Patient is a 82 year old Female.  Inpatient stroke alert was called for symptoms of ams. Patient is admitted to the hospital for complicated medical her course including GI bleeding status post variceal clipping and polyp removal with continued bloody stools and recent hypotension and hypoglycemia stroke alert called for confusion. Clarified the patient was more slow to respond she seemed more upbeat and awake previously. Patient alert and oriented states she just feels very tired. No notable acute focal weakness.    Past Medical History:      Hypertension      Atrial Fibrillation [place] PMH:  CHF, NASH  Medications:  No Anticoagulant use  No Antiplatelet use Reviewed EMR for current medications  Allergies:  Reviewed  Social History: Smoking: No  Family History:  There is no family history of premature cerebrovascular disease pertinent to this consultation  ROS : 14 Points Review of Systems was performed and was negative except mentioned in HPI.  Past Surgical History: There Is No Surgical History Contributory To Today's Visit     Examination: BP(114/61), Pulse(75), Blood Glucose(180) 1A: Level of Consciousness - Alert; keenly responsive + 0 1B: Ask Month and Age - Both Questions Right + 0 1C: Blink Eyes & Squeeze Hands - Performs Both Tasks + 0 2: Test Horizontal Extraocular Movements - Normal + 0 3: Test Visual Fields - No Visual Loss + 0 4: Test Facial Palsy (Use Grimace if Obtunded) - Normal symmetry + 0 5A: Test Left Arm Motor Drift - No Drift for 10 Seconds + 0 5B: Test Right Arm Motor Drift - No Drift for 10 Seconds + 0 6A: Test Left Leg Motor Drift - No Drift for 5 Seconds + 0 6B: Test Right Leg Motor Drift - No Drift for 5 Seconds + 0 7: Test Limb Ataxia (FNF/Heel-Shin) - No Ataxia + 0 8: Test Sensation -  Normal; No sensory loss +  0 9: Test Language/Aphasia - Normal; No aphasia + 0 10: Test Dysarthria - Normal + 0 11: Test Extinction/Inattention - No abnormality + 0  NIHSS Score: 0   Pre-Morbid Modified Rankin Scale: 1 Points = No significant disability despite symptoms; able to carry out all usual duties and activities  Spoke with : mazzan  This consult was conducted in real time using interactive audio and Immunologist. Patient was informed of the technology being used for this visit and agreed to proceed. Patient located in hospital and provider located at home/office setting.   Patient is being evaluated for possible acute neurologic impairment and high probability of imminent or life-threatening deterioration. I spent total of 45 minutes providing care to this patient, including time for face to face visit via telemedicine, review of medical records, imaging studies and discussion of findings with providers, the patient and/or family.   Dr Arlyce Harman   [facility] For Inpatient follow-up with [facility] physician please call [facility] [phone]. This is not an outpatient service. Post hospital discharge, please contact hospital directly.  Please do not communicate with [facility] physicians via secure chat. If you have any questions, Please contact [facility]. Please call or reconsult our service if there are any clinical or diagnostic changes.

## 2022-09-11 NOTE — Progress Notes (Signed)
Wyline Mood , MD [address], [address], [place], [place], [address] [address], [address], [place], [place], [address] Phone: [phone]  Fax: [phone]   Annette Foster is being followed for gi bleed  Day 1 of follow up   Subjective: Feels better this am , last night passed some blood clots in her stool. Non since. No hematemesis.    Objective: Vital signs in last 24 hours: Vitals:   09/11/22 0042 09/11/22 0322 09/11/22 0657 09/11/22 0756  BP: 114/61 (!) 109/57 (!) 103/58 106/60  Pulse: 74 74 80 74  Resp: 18 14 14 18   Temp: 98.1 F (36.7 C) 97.8 F (36.6 C) 97.8 F (36.6 C) 97.7 F (36.5 C)  TempSrc: Oral Oral Oral   SpO2: 99% 97% 95% 100%  Weight:      Height:       Weight change:   Intake/Output Summary (Last 24 hours) at 09/11/2022 1027 Last data filed at 09/11/2022 0400 Gross per 24 hour  Intake 893.92 ml  Output --  Net 893.92 ml     Exam: Heart:: Regular rate and rhythm Lungs: normal Abdomen: soft, nontender, normal bowel sounds   Lab Results: @LABTEST2 @ Micro Results: Recent Results (from the past 240 hour(s))  Culture, blood (Routine X 2) w Reflex to ID Panel     Status: None (Preliminary result)   Collection Time: 09/11/22  1:05 AM   Specimen: BLOOD  Result Value Ref Range Status   Specimen Description BLOOD BLOOD RIGHT HAND  Final   Special Requests   Final    BOTTLES DRAWN AEROBIC AND ANAEROBIC Blood Culture adequate volume   Culture   Final    NO GROWTH < 12 HOURS Performed at [facility], [address] [place], [place] [address]    Report Status PENDING  Incomplete  Culture, blood (Routine X 2) w Reflex to ID Panel     Status: None (Preliminary result)   Collection Time: 09/11/22  2:31 AM   Specimen: BLOOD  Result Value Ref Range Status   Specimen Description BLOOD BLOOD RIGHT HAND  Final   Special Requests   Final    BOTTLES DRAWN AEROBIC AND ANAEROBIC Blood Culture adequate volume   Culture   Final    NO  GROWTH < 12 HOURS Performed at [facility], [address] [place], [place] [address]    Report Status PENDING  Incomplete   Studies/Results: CT ABDOMEN WO CONTRAST  Result Date: 09/11/2022 CLINICAL DATA:  Acute abdominal pain.  Code stroke. EXAM: CT ABDOMEN WITHOUT CONTRAST TECHNIQUE: Multidetector CT imaging of the abdomen was performed following the standard protocol without IV contrast. RADIATION DOSE REDUCTION: This exam was performed according to the departmental dose-optimization program which includes automated exposure control, adjustment of the mA and/or kV according to patient size and/or use of iterative reconstruction technique. COMPARISON:  CTA from 3 days ago FINDINGS: Lower chest: Areas of subpleural reticulation attributed to fibrosis. Dual-chamber pacer leads on the right. Atherosclerosis and lower esophageal wall thickening. No acute finding Hepatobiliary: Cirrhotic liver morphology. Cholecystectomy without biliary dilatation. Pancreas: Indistinct appearance of the tail is attributed to fluid and vessels by prior CTA. No acute noncontrast finding. Spleen: Enlarged in the setting of portal hypertension with 18 cm span. No focal abnormality. Adrenals/Urinary Tract: Unremarkable adrenal glands and kidneys. No hydronephrosis. Stomach/Bowel: Fluid is seen within small bowel loops, but no specific obstructive pattern. Endo clip is seen in the ascending and distal transverse colon. Vascular/Lymphatic: Atheromatous  calcifications. No acute vascular finding by noncontrast technique. Mesenteric edema and nodal congestion is stable from prior CTA. Other: Small volume ascites. Musculoskeletal: Lumbar facet degeneration and endplate spurring. IMPRESSION: No acute or interval finding. Cirrhosis and portal hypertension with small volume ascites. Electronically Signed   By: Tiburcio Pea M.D.   On: 09/11/2022 04:33   CT HEAD CODE STROKE WO CONTRAST`  Result Date: 09/11/2022 CLINICAL  DATA:  Code stroke. Initial evaluation for neuro deficit, stroke suspected. No other relevant history is provided. EXAM: CT HEAD WITHOUT CONTRAST TECHNIQUE: Contiguous axial images were obtained from the base of the skull through the vertex without intravenous contrast. RADIATION DOSE REDUCTION: This exam was performed according to the departmental dose-optimization program which includes automated exposure control, adjustment of the mA and/or kV according to patient size and/or use of iterative reconstruction technique. COMPARISON:  None Available. FINDINGS: Brain: Mild age-related cerebral atrophy with chronic small vessel ischemic disease. No acute intracranial hemorrhage. No acute large vessel territory infarct. No mass lesion or midline shift. No hydrocephalus or extra-axial fluid collection. Vascular: No abnormal hyperdense vessel. Calcified atherosclerosis present both the skull base. Skull: Scalp soft tissues and calvarium within normal limits. Sinuses/Orbits: Globes and orbital soft tissues demonstrate no acute finding. Chronic frontoethmoidal and maxillary sinusitis. No mastoid effusion. Other: None. ASPECTS [facility] Stroke Program Early CT Score) - Ganglionic level infarction (caudate, lentiform nuclei, internal capsule, insula, M1-M3 cortex): 7 - Supraganglionic infarction (M4-M6 cortex): 3 Total score (0-10 with 10 being normal): 10 IMPRESSION: 1. No acute intracranial abnormality. 2. Aspects is 10. 3. Mild age-related cerebral atrophy with chronic small vessel ischemic disease. These results were communicated to Dr. Arville Care at 1:25 am on 09/11/2022 by text page via the [facility] messaging system. Electronically Signed   By: Rise Mu M.D.   On: 09/11/2022 01:26   Medications: I have reviewed the patient's current medications. Scheduled Meds:  dicyclomine  10 mg Oral TID AC & HS   fenofibrate  160 mg Oral QHS   gabapentin  600 mg Oral QHS   insulin aspart  0-9 Units Subcutaneous Q4H   insulin  glargine-yfgn  25 Units Subcutaneous QHS   lactulose  30 g Oral BID   levothyroxine  75 mcg Oral QAC breakfast   loratadine  10 mg Oral QPM   nadolol  20 mg Oral Daily   pantoprazole  80 mg Oral Q1200   polyethylene glycol-electrolytes  4,000 mL Oral Once   potassium chloride  20 mEq Oral BID   saccharomyces boulardii  250 mg Oral BID   simvastatin  20 mg Oral QHS   sodium chloride flush  3 mL Intravenous Q12H   spironolactone  12.5 mg Oral Daily   torsemide  20 mg Oral Daily   Continuous Infusions:  sodium chloride 20 mL/hr at 09/10/22 1126   cefTRIAXone (ROCEPHIN)  IV 1 g (09/10/22 1455)   dextrose 100 mL/hr at 09/11/22 0231   octreotide (SANDOSTATIN) 500 mcg in sodium chloride 0.9 % 250 mL (2 mcg/mL) infusion 50 mcg/hr (09/11/22 0415)   PRN Meds:.acetaminophen, hydrOXYzine, morphine injection, ondansetron **OR** ondansetron (ZOFRAN) IV, traMADol   Assessment: Principal Problem:   Lower GI bleed Active Problems:   Other pancytopenia (HCC)   Hypothyroidism   HTN (hypertension)   Diabetes (HCC)   GERD (gastroesophageal reflux disease)   Iron deficiency   NASH (nonalcoholic steatohepatitis)   Hematochezia   Acute GI bleeding   Chronic heart failure with preserved ejection fraction (HFpEF) (HCC)   Hepatic  cirrhosis (HCC)   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 bleeding (HCC)   Portal hypertension (HCC)   Pacemaker   Adenomatous polyp of colon  Annette Foster is a 82 y.o. 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  She follows with Dr. Servando Snare as an outpatient.  Presents emergency room with rectal bleeding.  In 2023 had a sigmoidoscopy to evaluate rectal bleeding had a subepithelial lesion that was further evaluated by EUS and was thought to be benign that subsequently resolved but came in with painless rectal bleeding.   Hemoglobin is down by 2 g from baseline. .  EGD on 09/10/2022 esophageal varices with red whale sign - banded x 2 - colonoscopy multiple polyps not very large however did bleed more than expected after resection , had clips placed along with purastat on the one in the descending colon . Hb stable . Overnight was drowsy - code stroke called- no evidence of stroke       Plan 1.  Monitor CBC transfuse as needed 2.  Octreotide for 72 hours 3. Antibiotics for 7 days  4. If has active bleeding then needs CT angiogram  5. Monitor clinically for hepatic encephalopathy , blood ammonia level doesn't rule in or out encephalopathy. Its a clinical diagnosis . Precipitants are low electrolytes, medications, dehydration , infections and gi bleeds.     LOS: 1 day   Wyline Mood, MD 09/11/2022, 9:18 AM

## 2022-09-12 DIAGNOSIS — K922 Gastrointestinal hemorrhage, unspecified: Secondary | ICD-10-CM | POA: Diagnosis not present

## 2022-09-12 DIAGNOSIS — K7581 Nonalcoholic steatohepatitis (NASH): Secondary | ICD-10-CM

## 2022-09-12 DIAGNOSIS — E611 Iron deficiency: Secondary | ICD-10-CM | POA: Diagnosis not present

## 2022-09-12 DIAGNOSIS — K7460 Unspecified cirrhosis of liver: Principal | ICD-10-CM

## 2022-09-12 DIAGNOSIS — K766 Portal hypertension: Secondary | ICD-10-CM

## 2022-09-12 DIAGNOSIS — I851 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 bleeding: Secondary | ICD-10-CM

## 2022-09-12 DIAGNOSIS — E1169 Type 2 diabetes mellitus with other specified complication: Secondary | ICD-10-CM

## 2022-09-12 LAB — GLUCOSE, CAPILLARY
Glucose-Capillary: 77 mg/dL (ref 70–99)
Glucose-Capillary: 93 mg/dL (ref 70–99)
Glucose-Capillary: 107 mg/dL — ABNORMAL HIGH (ref 70–99)
Glucose-Capillary: 225 mg/dL — ABNORMAL HIGH (ref 70–99)
Glucose-Capillary: 164 mg/dL — ABNORMAL HIGH (ref 70–99)
Glucose-Capillary: 123 mg/dL — ABNORMAL HIGH (ref 70–99)

## 2022-09-12 LAB — HEMOGLOBIN AND HEMATOCRIT, BLOOD
HCT: 31.2 % — ABNORMAL LOW (ref 36.0–46.0)
Hemoglobin: 10 g/dL — ABNORMAL LOW (ref 12.0–15.0)

## 2022-09-12 MED ORDER — INSULIN ASPART 100 UNIT/ML IJ SOLN
0.0000 [IU] | Freq: Three times a day (TID) | INTRAMUSCULAR | Status: DC
  Administered 2022-09-12 – 2022-09-13 (×3): 1 [IU] via SUBCUTANEOUS
  Filled 2022-09-12 (×3): qty 1

## 2022-09-12 MED ORDER — DEXTROSE 10 % IV SOLN: INTRAVENOUS | Status: DC

## 2022-09-12 NOTE — Progress Notes (Signed)
Progress Note   Patient: Annette Foster EAV:[id number] DOB: 20-Jul-1940 DOA: 09/08/2022     2 DOS: the patient was seen and examined on 09/12/2022   Brief hospital course: Annette Foster is a 82 y.o. 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  She notes that when she woke up this morning, prior to going to church, she has spontaneous rectal bleeding.  She put on a pad and went to church.  She subsequently went through 3 other pads and underwear due to the bleeding.  She has had bleeding like this before when it was associated with a variceal bleed.  She has been taking her medications including nadolol, spironolactone and torsemide. She has a history of NASH with cirrhosis and follows with Dr. Servando Snare.  Further symptoms include mild rectal pain.  She had an interesting history of peri-anal rectal mass, possible abscess in 2023 (seen on CT scan) and had a sigmoidoscopy around that time as well.  However, bleeding resolved on its own and she has not had issues until this time.  She reports not taking any anticoagulation including aspirin.  She is not taking NSAIDs. Last colonoscopy showed friable colon, internal hemorrhoids in 2022. Last EGD also in 2022 showed grade III varices and portal hypertensive gastropathy.  Banding was done at that time.   Patient was admitted to the hospital service for further management evaluation of GI bleed.  Patient is seen by GI, got EGD and colonoscopy.  EGD showed portal hypertensive gastropathy, grade 3 esophageal varices.  Colonoscopy showed 2 polyps which were resected in the descending colon and 1 polyp in ascending colon resected.  GI recommended IV  octreotide drip for 72 hours and empiric antibiotics for 5 days.  She is able to tolerate clear liquid diet which is advance gradually.  Due to her octreotide drip and blood sugars dropped even with continuous D5 fluids.  Octreotide stopped, started soft diet.  Assessment and Plan: Lower GI bleed Hematochezia Gastrointestinal hemorrhage Patient has a known history of liver cirrhosis secondary to NASH Trend H/H, did not require transfusion as hgb above 9. CTA of the abdomen did not show any acute bleeding.  S/p EGD--- Normal examined duodenum. Portal hypertensive gastropathy. Grade III esophageal varices. Completely eradicated. Banded. S/p colonoscopy Two 6 to 8 mm polyps in the descending colon, removed with a cold snare. Resected and retrieved. Clips were placed. One 5 mm polyp in the ascending colon, removed with a cold snare. Resected and retrieved. GI recommends IV octreotide gtt for 72 hours (ends 08/02), PPI, empiric abx for 5 days and advance diet over few days. Due to low blood sugars evan on IV dextrose fluids, Octreotide stopped. Diet advanced to soft diet with lower scale insulin.   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 Octreotide stopped as it can cause low sugars. She is on liquid diet, changed to soft encouraged to eat. Very sensitive sliding scale coverage for now   GERD (gastroesophageal reflux  disease) Continue PPI    Chronic heart failure with preserved ejection fraction (HFpEF) (HCC) Pacemaker  continue nadolol Pacemaker pocket appears clean, dry, no erythema   Right upper lobe infiltrate Concerning for aspiration pneumonia Patient had a CT angiogram of the chest to rule out a PE and it showed a large right upper lobe airspace opacity with air bronchograms is noted most consistent with pneumonia. IV Rocephin she is on for SBP prophylaxis covers.  Continue nursing supportive care. DVT prophylaxis - SCD. CODE STATUS- DNR.          Subjective: Patient was seen and examined today morning.  She did have bloody bowel movement this morning.  Blood sugars borderline low.  She is on full liquid diet asks for soft diet.  Able to get out of bed, denies any other complaints  Physical Exam: Vitals:   09/11/22 2355 09/12/22 0435 09/12/22 0813 09/12/22 1124  BP: 100/80 (!) 101/51 114/89 108/69  Pulse: 76 74 75 75  Resp: 18 16 17 18   Temp: 98 F (36.7 C) 97.9 F (36.6 C) 98.3 F (36.8 C) 98.1 F (36.7 C)  TempSrc: Oral Oral    SpO2: 99% 94% 100% 100%  Weight:      Height:       General - Elderly Caucasian female, no apparent distress HEENT - PERRLA, EOMI, atraumatic head, non tender sinuses. Lung - Clear, rales, rhonchi, wheezes. Heart - S1, S2 heard, no murmurs, rubs, trace pedal edema Neuro - Alert, awake and oriented x 3, non focal exam. Skin - Warm and dry. Data Reviewed:     Latest Ref Rng & Units 09/12/2022   11:54 AM 09/12/2022    3:57 AM 09/11/2022    9:21 AM  CBC  WBC 4.0 - 10.5 K/uL  2.3    Hemoglobin 12.0 - 15.0 g/dL 11.9  9.0  8.6   Hematocrit 36.0 - 46.0 % 31.2  27.0  25.6   Platelets 150 - 400 K/uL  89         Latest Ref Rng & Units 09/12/2022    3:57 AM 09/11/2022    1:05 AM 09/10/2022    3:44 AM  BMP  Glucose 70 - 99 mg/dL 80  147  65   BUN 8 - 23 mg/dL 12  11  10    Creatinine 0.44 - 1.00 mg/dL 8.29  5.62  1.30   Sodium 135 - 145 mmol/L 133   131  138   Potassium 3.5 - 5.1 mmol/L 3.7  3.5  3.3   Chloride 98 - 111 mmol/L 98  100  105   CO2 22 - 32 mmol/L 25  25  25    Calcium 8.9 - 10.3 mg/dL 8.3  8.3  8.7     Family Communication: Patient understands and agrees with current plan  Disposition: Status is: Inpatient Remains inpatient appropriate because: Acute GI bleed, monitor H&H, blood sugars  Planned Discharge Destination: Home    Time spent: 44 minutes  Author: Marcelino Duster, MD 09/12/2022 1:24 PM  For on call review [email or web address].

## 2022-09-12 NOTE — TOC Initial Note (Signed)
Transition of Care [facility]) - Initial/Assessment Note    Patient Details  Name: Annette Foster MRN: [id number] Date of Birth: 05/27/1940  Transition of [facility] [place] CM/SW Contact:    Garret Reddish, RN Phone Number: 09/12/2022, 3:49 PM  Clinical Narrative:   Chart reviewed.  Noted that patient was admitted with Lower GI Bleed.  GI was consulted to see patient. Patient had EGD on 09/10/2022 esophageal varices with red whale sign - banded x 2 - colonoscopy multiple polyps not very large however did bleed more than expected after resection , had clips placed along with purastat on the one in the descending colon. Patient is  currently on IV octreotide gtt, IV Rocephin Right upper lobe infiltrate.    I have meet with patient at bedside today.  She informs me that prior to admission she was independent of ADL's.  She reports that she was able to drive prior to admission.  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  Annette Foster husband has dementia and she also goes and checks on him a lot.  She also assist with her brothers needs.  Annette Foster reports that she has a cane, walker and wheelchair at home that was her brothers.  Annette Foster does not use any assistive devices to get around with.    Annette Foster plans to return home on discharge.   No TOC needs identified at this time.                    Expected Discharge Plan: Home/Self Care Barriers to Discharge: No Barriers Identified   Patient Goals and CMS Choice            Expected Discharge Plan and Services   Discharge Planning Services: CM Consult                                          Prior Living Arrangements/Services   Lives with:: Other (Comment) (Lives with her husband's sister) Patient language and need for interpreter reviewed:: Yes Do you feel safe going back to the place where you live?: Yes      Need for Family Participation in Patient Care: Yes (Comment) Care giver support system in place?:  Yes (comment) (Patient has a supportive daughter and church family)      Activities of Daily Living Home Assistive Devices/Equipment: Eyeglasses ADL Screening (condition at time of admission) Patient's cognitive ability adequate to safely complete daily activities?: Yes Is the patient deaf or have difficulty hearing?: No Does the patient have difficulty seeing, even when wearing glasses/contacts?: No Does the patient have difficulty concentrating, remembering, or making decisions?: No Patient able to express need for assistance with ADLs?: Yes Does the patient have difficulty dressing or bathing?: No Independently performs ADLs?: Yes (appropriate for developmental age) Does the patient have difficulty walking or climbing stairs?: No Weakness of Legs: None Weakness of Arms/Hands: None  Permission Sought/Granted                  Emotional Assessment Appearance:: Appears younger than stated age Attitude/Demeanor/Rapport: Engaged Affect (typically observed): Pleasant Orientation: : Oriented to Self, Oriented to Place, Oriented to  Time, Oriented to Situation      Admission diagnosis:  Acute GI bleeding [K92.2] Lower GI bleed [K92.2] Patient Active Problem List   Diagnosis Date Noted   Aspiration pneumonia (HCC) 09/11/2022  Adenomatous polyp of colon 09/10/2022   Lower GI bleed 09/08/2022   Leg pain 07/18/2022   Varicose veins with inflammation 07/17/2022   Unspecified injury of head, initial encounter 09/14/2021   Rectal lesion    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   Atrial fibrillation (HCC) 05/17/2021   Pacemaker 03/23/2021   Portal hypertension (HCC)    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 bleeding (HCC)    Chronic heart failure with preserved ejection fraction (HFpEF) (HCC) 04/05/2020   Hepatic cirrhosis (HCC) 04/05/2020   Abnormal EKG 04/05/2020   Allergic rhinitis due to pollen 02/24/2020   Chronic allergic conjunctivitis 02/24/2020   Allergic rhinitis due  to animal (cat) (dog) hair and dander 02/24/2020   Allergic rhinitis 02/24/2020   Polyp of transverse colon    Hematochezia 02/17/2020   Chronic anticoagulation 02/17/2020   Anasarca 02/17/2020   Symptomatic sinus bradycardia 02/17/2020   Paroxysmal atrial fibrillation (HCC) 02/17/2020   AKI (acute kidney injury) (HCC) 02/17/2020   History of colitis 02/17/2020   Acute GI bleeding    Atrial fibrillation with RVR (HCC) 02/07/2020   NASH (nonalcoholic steatohepatitis)    Hav (hallux abducto valgus), unspecified laterality 06/03/2019   Acquired bilateral hammer toes 06/03/2019   Iron deficiency 04/26/2019   Right-sided chest wall pain 04/12/2019   Mid back pain on right side 04/12/2019   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 11/03/2017   Recurrent UTI (urinary tract infection) 07/23/2017   Nausea vomiting and diarrhea 01/11/2017   Hypothyroidism 01/11/2017   HTN (hypertension) 01/11/2017   Diabetes (HCC) 01/11/2017   GERD (gastroesophageal reflux disease) 01/11/2017   Other pancytopenia (HCC) 11/14/2016   Monilial vulvitis 11/06/2016   Spongiotic psoriasiform dermatitis 10/08/2016   Status post hysterectomy 06/20/2016   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 [place] [address] Phone: [phone] Fax: [phone]     Social Determinants of Health (SDOH) Social History: SDOH Screenings   Food Insecurity: No Food Insecurity (09/09/2022)  Housing: Low Risk  (09/09/2022)  Transportation Needs: No Transportation Needs (09/09/2022)  Utilities: Not At Risk (09/09/2022)  Physical Activity: Unknown (01/30/2018)   Received from [facility]  Tobacco Use: Low Risk  (09/10/2022)   SDOH Interventions:     Readmission Risk Interventions     No  data to display

## 2022-09-12 NOTE — Plan of Care (Signed)
  Problem: Coping: Goal: Ability to adjust to condition or change in health will improve Outcome: Progressing   Problem: Fluid Volume: Goal: Ability to maintain a balanced intake and output will improve Outcome: Progressing   Problem: Health Behavior/Discharge Planning: Goal: Ability to manage health-related needs will improve Outcome: Progressing   Problem: Nutritional: Goal: Maintenance of adequate nutrition will improve Outcome: Progressing   Problem: Skin Integrity: Goal: Risk for impaired skin integrity will decrease Outcome: Progressing   Problem: Education: Goal: Knowledge of General Education information will improve Description: Including pain rating scale, medication(s)/side effects and non-pharmacologic comfort measures Outcome: Progressing   Problem: Health Behavior/Discharge Planning: Goal: Ability to manage health-related needs will improve Outcome: Progressing   Problem: Clinical Measurements: Goal: Will remain free from infection Outcome: Progressing Goal: Diagnostic test results will improve Outcome: Progressing   Problem: Activity: Goal: Risk for activity intolerance will decrease Outcome: Progressing   Problem: Nutrition: Goal: Adequate nutrition will be maintained Outcome: Progressing   Problem: Coping: Goal: Level of anxiety will decrease Outcome: Progressing

## 2022-09-13 DIAGNOSIS — E1165 Type 2 diabetes mellitus with hyperglycemia: Secondary | ICD-10-CM

## 2022-09-13 DIAGNOSIS — Z950 Presence of cardiac pacemaker: Secondary | ICD-10-CM

## 2022-09-13 DIAGNOSIS — K921 Melena: Secondary | ICD-10-CM | POA: Diagnosis not present

## 2022-09-13 DIAGNOSIS — K7460 Unspecified cirrhosis of liver: Secondary | ICD-10-CM | POA: Diagnosis not present

## 2022-09-13 DIAGNOSIS — K7581 Nonalcoholic steatohepatitis (NASH): Secondary | ICD-10-CM | POA: Diagnosis not present

## 2022-09-13 DIAGNOSIS — I5032 Chronic diastolic (congestive) heart failure: Secondary | ICD-10-CM

## 2022-09-13 DIAGNOSIS — K922 Gastrointestinal hemorrhage, unspecified: Secondary | ICD-10-CM | POA: Diagnosis not present

## 2022-09-13 LAB — CBC
HCT: 27.6 % — ABNORMAL LOW (ref 36.0–46.0)
Hemoglobin: 9.2 g/dL — ABNORMAL LOW (ref 12.0–15.0)
MCH: 30.2 pg (ref 26.0–34.0)
MCHC: 33.3 g/dL (ref 30.0–36.0)
MCV: 90.5 fL (ref 80.0–100.0)
Platelets: 90 10*3/uL — ABNORMAL LOW (ref 150–400)
RBC: 3.05 MIL/uL — ABNORMAL LOW (ref 3.87–5.11)
RDW: 13.6 % (ref 11.5–15.5)
WBC: 2.3 10*3/uL — ABNORMAL LOW (ref 4.0–10.5)
nRBC: 0 % (ref 0.0–0.2)

## 2022-09-13 LAB — GLUCOSE, CAPILLARY
Glucose-Capillary: 205 mg/dL — ABNORMAL HIGH (ref 70–99)
Glucose-Capillary: 154 mg/dL — ABNORMAL HIGH (ref 70–99)
Glucose-Capillary: 155 mg/dL — ABNORMAL HIGH (ref 70–99)

## 2022-09-13 MED ORDER — ORAL CARE MOUTH RINSE: 15.0000 mL | OROMUCOSAL | Status: DC | PRN

## 2022-09-13 MED ORDER — CIPROFLOXACIN HCL 500 MG PO TABS
500.0000 mg | ORAL_TABLET | Freq: Once | ORAL | Status: AC
  Administered 2022-09-13: 500 mg via ORAL
  Filled 2022-09-13: qty 1

## 2022-09-13 NOTE — Plan of Care (Signed)
  Problem: Coping: Goal: Ability to adjust to condition or change in health will improve Outcome: Progressing   Problem: Fluid Volume: Goal: Ability to maintain a balanced intake and output will improve Outcome: Progressing   Problem: Health Behavior/Discharge Planning: Goal: Ability to identify and utilize available resources and services will improve Outcome: Progressing Goal: Ability to manage health-related needs will improve Outcome: Progressing   Problem: Metabolic: Goal: Ability to maintain appropriate glucose levels will improve Outcome: Progressing   Problem: Nutritional: Goal: Maintenance of adequate nutrition will improve Outcome: Progressing   Problem: Skin Integrity: Goal: Risk for impaired skin integrity will decrease Outcome: Progressing   Problem: Education: Goal: Knowledge of General Education information will improve Description: Including pain rating scale, medication(s)/side effects and non-pharmacologic comfort measures Outcome: Progressing   Problem: Health Behavior/Discharge Planning: Goal: Ability to manage health-related needs will improve Outcome: Progressing   Problem: Clinical Measurements: Goal: Ability to maintain clinical measurements within normal limits will improve Outcome: Progressing Goal: Will remain free from infection Outcome: Progressing Goal: Diagnostic test results will improve Outcome: Progressing

## 2022-09-13 NOTE — Discharge Summary (Signed)
Physician Discharge Summary   Patient: Annette Foster MRN: [id number] DOB: 09-Mar-1940  Admit date:     09/08/2022  Discharge date: 09/13/22  Discharge Physician: Marcelino Duster   PCP: Jaclyn Shaggy, MD   Recommendations at discharge:    PCP in 1 week. Follow GI outpatient as scheduled.  Discharge Diagnoses: Principal Problem:   Lower GI bleed Active Problems:   Other pancytopenia (HCC)   Hypothyroidism   HTN (hypertension)   Diabetes (HCC)   GERD (gastroesophageal reflux disease)   Iron deficiency   NASH (nonalcoholic steatohepatitis)   Hematochezia   Acute GI bleeding   Chronic heart failure with preserved ejection fraction (HFpEF) (HCC)   Hepatic cirrhosis (HCC)   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 bleeding (HCC)   Portal hypertension (HCC)   Pacemaker   Adenomatous polyp of colon   Aspiration pneumonia (HCC)  Resolved Problems:   * No resolved hospital problems. *  Hospital Course: Annette Foster is a 82 y.o. 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  She notes that when she woke up this morning, prior to going to church, she has spontaneous rectal bleeding.  She put on a pad and went to church.  She subsequently went through 3 other pads and underwear due to the bleeding.  She has had bleeding like this before when it was associated with a variceal bleed.  She has been taking her medications including nadolol, spironolactone and torsemide. She has a history of NASH with cirrhosis and follows with Dr. Servando Snare.  Further symptoms include mild rectal pain.  She had an interesting history of peri-anal rectal mass, possible abscess in 2023 (seen on CT  scan) and had a sigmoidoscopy around that time as well.  However, bleeding resolved on its own and she has not had issues until this time.  She reports not taking any anticoagulation including aspirin.  She is not taking NSAIDs. Last colonoscopy showed friable colon, internal hemorrhoids in 2022. Last EGD also in 2022 showed grade III varices and portal hypertensive gastropathy.  Banding was done at that time.    Patient was admitted to the hospital service for further management evaluation of GI bleed.  Patient is seen by GI, got EGD and colonoscopy.  EGD showed portal hypertensive gastropathy, grade 3 esophageal varices.  Colonoscopy showed 2 polyps which were resected in the descending colon and 1 polyp in ascending colon resected.  GI recommended IV octreotide drip for 72 hours and empiric antibiotics for 5 days.  She is able to tolerate clear liquid diet which is advance gradually.  Due to her octreotide drip and blood sugars dropped even with continuous D5 fluids.  Octreotide stopped, started soft diet. No more active bleeding, hemodynamically stable to be discharged home.   Assessment and Plan: Assessment and Plan: Lower GI bleed Hematochezia Gastrointestinal hemorrhage S/p EGD--- Normal examined duodenum. Portal hypertensive gastropathy. Grade III esophageal varices. Completely eradicated. Banded. S/p colonoscopy Two 6 to 8 mm polyps in the descending colon, removed with a cold snare. Resected and retrieved. Clips were placed. One 5 mm polyp in the ascending colon, removed with a cold snare. Resected and retrieved. Finished IV octreotide gtt for 50 hours (due to low blood sugars had to  stop it), empiric abx for 5 days and advance diet over few days. Diet advanced, no more bleeding episodes.    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 She is advised to restart her home insulin regimen.   GERD (gastroesophageal reflux disease) Continue PPI    Chronic heart failure with preserved ejection fraction (HFpEF) (HCC) Pacemaker  continue nadolol Pacemaker pocket appears clean, dry, no erythema   Right upper lobe infiltrate Concerning for aspiration pneumonia Finished Rocephin therapy. No shortness of breath, cough.      Consultants: GI Procedures performed: EGD and colonoscopy  Disposition: Home Diet recommendation:  Discharge Diet Orders (From admission, onward)     Start     Ordered   09/13/22 0000  Diet - low sodium heart healthy        09/13/22 1226           Cardiac and Carb modified diet DISCHARGE MEDICATION: Allergies as of 09/13/2022       Reactions   Codeine Itching, Nausea And Vomiting, Other (See Comments)   Demeclocycline Rash   Demerol [meperidine] Itching, Nausea And Vomiting   Hydrocodone Itching, Nausea And Vomiting   Other Other (See Comments)   Oxycodone Itching, Nausea And Vomiting   Pentazocine Itching, Nausea And Vomiting, Other (See Comments)   Tetracyclines & Related Rash   Coal Tar Extract Other (See Comments)   Hydrocodone-acetaminophen Other (See Comments)   Salicylic Acid Other (See Comments)   Tetracycline Hcl Other (See Comments)   Fentanyl Itching, Nausea And Vomiting, Other (See Comments)   D/W surgical nurse on 7/23 and pt reportedly tolerated dose during surgery.  Dose was given by MD.  Pt reported that she only gets itching and did not object to receiving med        Medication  List     STOP taking these medications    acetaminophen 325 MG tablet Commonly known as: TYLENOL       TAKE these medications    Azelastine HCl 137 MCG/SPRAY Soln INHALE 1-2 PUFFS IN EACH NOSTRIL NASALLY ONCE A DAY 30 DAY(S)   clobetasol ointment 0.05 % Commonly known as: TEMOVATE Apply 1 application topically as needed (vaginal irritation).   clotrimazole 1 % cream Commonly known as: LOTRIMIN Apply 1 Application topically 2 (two) times daily as needed.   cyanocobalamin 1000 MCG/ML injection Commonly known as: VITAMIN B12 1,000 mcg every 30 (thirty) days.   cyclobenzaprine 5 MG tablet Commonly known as: FLEXERIL Take 1 tablet by mouth 2 (two) times daily.   dicyclomine 10 MG capsule Commonly known as: BENTYL Take 10 mg by mouth in the morning, at noon, and at bedtime.   docusate sodium 100 MG capsule Commonly known as: COLACE Take 100 mg by mouth 3 (three) times daily as needed for moderate constipation.   EPINEPHrine 0.3 mg/0.3 mL Soaj injection Commonly known as: EPI-PEN Inject 0.3 mg into the muscle as needed for anaphylaxis.   esomeprazole 40 MG capsule Commonly known as: NEXIUM Take 40 mg by mouth 2 (two) times daily before  a meal.   estradiol 0.1 MG/GM vaginal cream Commonly known as: ESTRACE Place 1 Applicatorful vaginally daily as needed (irritation).   fenofibrate 160 MG tablet Take 160 mg by mouth at bedtime.   gabapentin 300 MG capsule Commonly known as: NEURONTIN Take 300 mg by mouth at bedtime.   hydrOXYzine 25 MG tablet Commonly known as: ATARAX Take 25 mg by mouth every 6 (six) hours as needed for anxiety, itching, nausea or vomiting.   levocetirizine 5 MG tablet Commonly known as: XYZAL Take 5 mg by mouth every evening.   levothyroxine 75 MCG tablet Commonly known as: SYNTHROID Take 75 mcg by mouth daily before breakfast.   magnesium oxide 400 MG tablet Commonly known as: MAG-OX Take 400 mg by mouth 2 (two) times daily.    Mounjaro 7.5 MG/0.5ML Pen Generic drug: tirzepatide Inject 12.5 mg into the skin once a week.   nadolol 20 MG tablet Commonly known as: CORGARD Take 1 tablet (20 mg total) by mouth daily.   NONFORMULARY OR COMPOUNDED ITEM Nifedipine 0.3% plus lidocaine 2% cream apply to rectum BID and after every BM.   NovoLOG FlexPen 100 UNIT/ML FlexPen Generic drug: insulin aspart Inject 48-68 Units into the skin 3 (three) times daily with meals. Based on sliding scale   potassium chloride SA 20 MEQ tablet Commonly known as: KLOR-CON M Take 20 mEq by mouth 2 (two) times daily.   saccharomyces boulardii 250 MG capsule Commonly known as: FLORASTOR Take 250 mg by mouth 2 (two) times daily.   simvastatin 20 MG tablet Commonly known as: ZOCOR Take 20 mg by mouth at bedtime.   spironolactone 25 MG tablet Commonly known as: ALDACTONE TAKE 1/2 TABLET BY MOUTH DAILY   torsemide 20 MG tablet Commonly known as: DEMADEX TAKE 1 TABLET BY MOUTH EVERY DAY   Tresiba FlexTouch 200 UNIT/ML FlexTouch Pen Generic drug: insulin degludec 194 Unit(s) SUB-Q Daily What changed: Another medication with the same name was removed. Continue taking this medication, and follow the directions you see here.   valACYclovir 500 MG tablet Commonly known as: VALTREX Take 500 mg by mouth 2 (two) times daily as needed (fever blisters).   Vitamin D 50 MCG (2000 UT) Caps Take 2,000 Units by mouth daily.        Discharge Exam: Filed Weights   09/08/22 1558  Weight: 63 kg   General - Elderly Caucasian female, no apparent distress HEENT - PERRLA, EOMI, atraumatic head, non tender sinuses. Lung - Clear, rales, rhonchi, wheezes. Heart - S1, S2 heard, no murmurs, rubs, trace pedal edema Neuro - Alert, awake and oriented x 3, non focal exam. Skin - Warm and dry.  Condition at discharge: stable  The results of significant diagnostics from this hospitalization (including imaging, microbiology, ancillary and  laboratory) are listed below for reference.   Imaging Studies: DG Abd 1 View  Result Date: 09/11/2022 CLINICAL DATA:  Pain.  Abdominal distension.  Colonoscopy yesterday. EXAM: ABDOMEN - 1 VIEW COMPARISON:  CTA chest and CT abdomen 09/11/2022. CTA abdomen and pelvis 09/08/2022. FINDINGS: No gross intraperitoneal free air is identified, however sensitivity is reduced by supine positioning. There is mild gaseous distension of the stomach and transverse colon. No bowel dilatation suggestive of obstruction is evident. Hemostatic clips project in the region of the hepatic and splenic flexures as well as in the left lower pelvis. Other surgical clips are noted in the pelvis and right upper quadrant. Interstitial densities in the included lung bases were more fully evaluated on  today's earlier chest CTA. No acute osseous abnormality is seen. IMPRESSION: Nonobstructed bowel gas pattern. Electronically Signed   By: Sebastian Ache M.D.   On: 09/11/2022 11:13   CT Angio Chest Pulmonary Embolism (PE) W or WO Contrast  Result Date: 09/11/2022 CLINICAL DATA:  Positive D-dimer.  Suspected pulmonary embolism. EXAM: CT ANGIOGRAPHY CHEST WITH CONTRAST TECHNIQUE: Multidetector CT imaging of the chest was performed using the standard protocol during bolus administration of intravenous contrast. Multiplanar CT image reconstructions and MIPs were obtained to evaluate the vascular anatomy. RADIATION DOSE REDUCTION: This exam was performed according to the departmental dose-optimization program which includes automated exposure control, adjustment of the mA and/or kV according to patient size and/or use of iterative reconstruction technique. CONTRAST:  55mL OMNIPAQUE IOHEXOL 350 MG/ML SOLN COMPARISON:  September 08, 2020. FINDINGS: Cardiovascular: Satisfactory opacification of the pulmonary arteries to the segmental level. No evidence of pulmonary embolism. Mild cardiomegaly. No pericardial effusion. Coronary artery calcifications are  noted. Mediastinum/Nodes: Small sliding-type hiatal hernia. No adenopathy. Thyroid gland is unremarkable. Lungs/Pleura: No pneumothorax or pleural effusion is noted. Large right upper lobe airspace opacity with air bronchograms is noted most consistent with pneumonia. Minimal bibasilar subsegmental atelectasis is noted. Opacity is noted medially in left upper lobe concerning for scarring or atelectasis. Upper Abdomen: Hepatic cirrhosis with minimal ascites noted around the liver. Moderate splenomegaly. Musculoskeletal: No chest wall abnormality. No acute or significant osseous findings. Review of the MIP images confirms the above findings. IMPRESSION: No definite evidence of pulmonary embolus. Large right upper lobe airspace opacity is noted most consistent with pneumonia, but follow-up radiographs over the next several weeks are recommended to ensure resolution and rule out underlying malignancy. Hepatic cirrhosis with minimal ascites and moderate splenomegaly. Small sliding-type hiatal hernia. Coronary artery calcifications are noted suggesting coronary artery disease. Aortic Atherosclerosis (ICD10-I70.0). Electronically Signed   By: Lupita Raider M.D.   On: 09/11/2022 09:25   CT ABDOMEN WO CONTRAST  Result Date: 09/11/2022 CLINICAL DATA:  Acute abdominal pain.  Code stroke. EXAM: CT ABDOMEN WITHOUT CONTRAST TECHNIQUE: Multidetector CT imaging of the abdomen was performed following the standard protocol without IV contrast. RADIATION DOSE REDUCTION: This exam was performed according to the departmental dose-optimization program which includes automated exposure control, adjustment of the mA and/or kV according to patient size and/or use of iterative reconstruction technique. COMPARISON:  CTA from 3 days ago FINDINGS: Lower chest: Areas of subpleural reticulation attributed to fibrosis. Dual-chamber pacer leads on the right. Atherosclerosis and lower esophageal wall thickening. No acute finding Hepatobiliary:  Cirrhotic liver morphology. Cholecystectomy without biliary dilatation. Pancreas: Indistinct appearance of the tail is attributed to fluid and vessels by prior CTA. No acute noncontrast finding. Spleen: Enlarged in the setting of portal hypertension with 18 cm span. No focal abnormality. Adrenals/Urinary Tract: Unremarkable adrenal glands and kidneys. No hydronephrosis. Stomach/Bowel: Fluid is seen within small bowel loops, but no specific obstructive pattern. Endo clip is seen in the ascending and distal transverse colon. Vascular/Lymphatic: Atheromatous calcifications. No acute vascular finding by noncontrast technique. Mesenteric edema and nodal congestion is stable from prior CTA. Other: Small volume ascites. Musculoskeletal: Lumbar facet degeneration and endplate spurring. IMPRESSION: No acute or interval finding. Cirrhosis and portal hypertension with small volume ascites. Electronically Signed   By: Tiburcio Pea M.D.   On: 09/11/2022 04:33   CT HEAD CODE STROKE WO CONTRAST`  Result Date: 09/11/2022 CLINICAL DATA:  Code stroke. Initial evaluation for neuro deficit, stroke suspected. No other relevant history is provided.  EXAM: CT HEAD WITHOUT CONTRAST TECHNIQUE: Contiguous axial images were obtained from the base of the skull through the vertex without intravenous contrast. RADIATION DOSE REDUCTION: This exam was performed according to the departmental dose-optimization program which includes automated exposure control, adjustment of the mA and/or kV according to patient size and/or use of iterative reconstruction technique. COMPARISON:  None Available. FINDINGS: Brain: Mild age-related cerebral atrophy with chronic small vessel ischemic disease. No acute intracranial hemorrhage. No acute large vessel territory infarct. No mass lesion or midline shift. No hydrocephalus or extra-axial fluid collection. Vascular: No abnormal hyperdense vessel. Calcified atherosclerosis present both the skull base. Skull:  Scalp soft tissues and calvarium within normal limits. Sinuses/Orbits: Globes and orbital soft tissues demonstrate no acute finding. Chronic frontoethmoidal and maxillary sinusitis. No mastoid effusion. Other: None. ASPECTS [facility] Stroke Program Early CT Score) - Ganglionic level infarction (caudate, lentiform nuclei, internal capsule, insula, M1-M3 cortex): 7 - Supraganglionic infarction (M4-M6 cortex): 3 Total score (0-10 with 10 being normal): 10 IMPRESSION: 1. No acute intracranial abnormality. 2. Aspects is 10. 3. Mild age-related cerebral atrophy with chronic small vessel ischemic disease. These results were communicated to Dr. Arville Care at 1:25 am on 09/11/2022 by text page via the [facility] messaging system. Electronically Signed   By: Rise Mu M.D.   On: 09/11/2022 01:26   CT ANGIO GI BLEED  Result Date: 09/08/2022 CLINICAL DATA:  Bright red blood per rectum EXAM: CTA ABDOMEN AND PELVIS WITHOUT AND WITH CONTRAST TECHNIQUE: Multidetector CT imaging of the abdomen and pelvis was performed using the standard protocol during bolus administration of intravenous contrast. Multiplanar reconstructed images and MIPs were obtained and reviewed to evaluate the vascular anatomy. RADIATION DOSE REDUCTION: This exam was performed according to the departmental dose-optimization program which includes automated exposure control, adjustment of the mA and/or kV according to patient size and/or use of iterative reconstruction technique. CONTRAST:  75mL OMNIPAQUE IOHEXOL 350 MG/ML SOLN COMPARISON:  06/05/2021 FINDINGS: VASCULAR Aorta: Normal caliber aorta without aneurysm, dissection, vasculitis or significant stenosis. Diffuse atherosclerosis. Celiac: Patent without evidence of aneurysm, dissection, vasculitis or significant stenosis. SMA: Patent without evidence of aneurysm, dissection, vasculitis or significant stenosis. Renals: There are 3 renal arteries identified bilaterally. No high-grade stenosis, aneurysm, or  dissection. No evidence of vasculitis. IMA: Patent without evidence of aneurysm, dissection, vasculitis or significant stenosis. Inflow: Patent without evidence of aneurysm, dissection, vasculitis or significant stenosis. Proximal Outflow: Bilateral common femoral and visualized portions of the superficial and profunda femoral arteries are patent without evidence of aneurysm, dissection, vasculitis or significant stenosis. Veins: Stable findings of portal venous hypertension, with large gastroesophageal varices, recanalization of the umbilical vein, and multiple pelvic varices identified. The SMV, splenic vein, and portal vein remain patent. Remaining venous structures are unremarkable. Review of the MIP images confirms the above findings. NON-VASCULAR Lower chest: Bibasilar areas of scarring. No acute airspace disease. Heart is enlarged without pericardial effusion. Hepatobiliary: Stable findings of cirrhosis, with nodularity of the liver capsule. No focal liver abnormality. Prior cholecystectomy. No biliary duct dilation. Pancreas: Unremarkable. No pancreatic ductal dilatation or surrounding inflammatory changes. Spleen: The spleen is enlarged, measuring 17.2 x 18.9 x 10.0 cm. No focal splenic abnormality. Adrenals/Urinary Tract: Adrenal glands are unremarkable. Kidneys are normal, without renal calculi, focal lesion, or hydronephrosis. Bladder is unremarkable. Stomach/Bowel: No bowel obstruction or ileus. No bowel wall thickening or inflammatory change. Small hiatal hernia. There is no intraluminal accumulation of contrast to suggest active gastrointestinal hemorrhage. Lymphatic: Nonspecific subcentimeter retroperitoneal lymph nodes. No pathologic  adenopathy. Reproductive: Status post hysterectomy. No adnexal masses. Other: Trace upper abdominal ascites. No free intraperitoneal gas. No abdominal wall hernia. Musculoskeletal: No acute or destructive bony abnormalities. Reconstructed images demonstrate no  additional findings. IMPRESSION: VASCULAR 1. No evidence of active gastrointestinal hemorrhage. 2. Portal venous hypertension, with gastroesophageal varices and numerous venous collaterals throughout the abdomen and pelvis. Portal vein remains patent. 3.  Aortic Atherosclerosis (ICD10-I70.0). NON-VASCULAR 1. Cirrhosis, with portal venous hypertension manifested by ascites, splenomegaly, and varices as above. 2.  Aortic Atherosclerosis (ICD10-I70.0). Electronically Signed   By: Sharlet Salina M.D.   On: 09/08/2022 18:53    Microbiology: Results for orders placed or performed during the hospital encounter of 09/08/22  Culture, blood (Routine X 2) w Reflex to ID Panel     Status: None (Preliminary result)   Collection Time: 09/11/22  1:05 AM   Specimen: BLOOD  Result Value Ref Range Status   Specimen Description BLOOD BLOOD RIGHT HAND  Final   Special Requests   Final    BOTTLES DRAWN AEROBIC AND ANAEROBIC Blood Culture adequate volume   Culture   Final    NO GROWTH 2 DAYS Performed at [facility], [address] [place], [place] [address]    Report Status PENDING  Incomplete  Culture, blood (Routine X 2) w Reflex to ID Panel     Status: None (Preliminary result)   Collection Time: 09/11/22  2:31 AM   Specimen: BLOOD  Result Value Ref Range Status   Specimen Description BLOOD BLOOD RIGHT HAND  Final   Special Requests   Final    BOTTLES DRAWN AEROBIC AND ANAEROBIC Blood Culture adequate volume   Culture   Final    NO GROWTH 2 DAYS Performed at [facility], [address] [place], [place] [address]    Report Status PENDING  Incomplete    Labs: CBC: Recent Labs  Lab 09/09/22 0533 09/10/22 0344 09/10/22 2121 09/11/22 0105 09/11/22 0231 09/11/22 0921 09/12/22 0357 09/12/22 1154 09/13/22 0759  WBC 1.9* 2.0*  --  2.7*  --   --  2.3*  --  2.3*  HGB 8.4* 8.4*   < > 8.7* 8.8* 8.6* 9.0* 10.0* 9.2*  HCT 25.5* 26.1*   < > 26.6* 26.5* 25.6* 27.0* 31.2* 27.6*  MCV  92.1 93.5  --  92.7  --   --  92.2  --  90.5  PLT 75* 75*  --  91*  --   --  89*  --  90*   < > = values in this interval not displayed.   Basic Metabolic Panel: Recent Labs  Lab 09/08/22 1600 09/09/22 0533 09/10/22 0344 09/11/22 0105 09/12/22 0357  NA 138 137 138 131* 133*  K 4.3 3.1* 3.3* 3.5 3.7  CL 102 105 105 100 98  CO2 27 25 25 25 25   GLUCOSE 251* 136* 65* 174* 80  BUN 13 13 10 11 12   CREATININE 1.02* 0.66 0.66 0.72 0.87  CALCIUM 8.7* 8.3* 8.7* 8.3* 8.3*   Liver Function Tests: Recent Labs  Lab 09/08/22 1600  AST 24  ALT 16  ALKPHOS 60  BILITOT 0.6  PROT 8.2*  ALBUMIN 3.3*   CBG: Recent Labs  Lab 09/12/22 1129 09/12/22 1535 09/12/22 2106 09/13/22 0541 09/13/22 0735  GLUCAP 225* 164* 123* 205* 154*    Discharge time spent: greater than 30 minutes.  Signed: Marcelino Duster, MD [facility] 09/13/2022

## 2022-09-13 NOTE — Care Management Important Message (Signed)
Important Message  Patient Details  Name: Annette Foster MRN: [id number] Date of Birth: 1940/08/22   Medicare Important Message Given:  Yes     Olegario Messier A Cheryle Dark 09/13/2022, 12:26 PM

## 2022-09-16 ENCOUNTER — Encounter: Payer: Self-pay | Admitting: Gastroenterology

## 2022-09-19 ENCOUNTER — Ambulatory Visit: Payer: Medicare PPO | Admitting: Podiatry

## 2022-09-20 ENCOUNTER — Ambulatory Visit (INDEPENDENT_AMBULATORY_CARE_PROVIDER_SITE_OTHER): Payer: Medicare PPO

## 2022-09-20 ENCOUNTER — Telehealth: Payer: Self-pay | Admitting: Gastroenterology

## 2022-09-20 ENCOUNTER — Other Ambulatory Visit: Payer: Self-pay

## 2022-09-20 ENCOUNTER — Encounter: Payer: Self-pay | Admitting: Internal Medicine

## 2022-09-20 DIAGNOSIS — I495 Sick sinus syndrome: Secondary | ICD-10-CM

## 2022-09-20 LAB — CUP PACEART REMOTE DEVICE CHECK
Date Time Interrogation Session: 20240809075406
Implantable Lead Connection Status: 753985
Implantable Lead Connection Status: 753985
Implantable Lead Implant Date: 20230210
Implantable Lead Implant Date: 20230210
Implantable Lead Location: 753859
Implantable Lead Location: 753860
Implantable Lead Model: 377169
Implantable Lead Model: 377171
Implantable Lead Serial Number: 7000400966
Implantable Lead Serial Number: 7000402075
Implantable Pulse Generator Implant Date: 20230210
Pulse Gen Model: 407145
Pulse Gen Serial Number: 70331369

## 2022-09-20 NOTE — Telephone Encounter (Signed)
Pt had endoscopy on 09/10/2022 was told to schedule another follow-up procedure in 4 weeks requesting call back

## 2022-09-23 ENCOUNTER — Telehealth: Payer: Self-pay | Admitting: Internal Medicine

## 2022-09-23 ENCOUNTER — Other Ambulatory Visit: Payer: Self-pay

## 2022-09-23 MED ORDER — MOUNJARO 10 MG/0.5ML ~~LOC~~ SOAJ
10.0000 mg | SUBCUTANEOUS | 1 refills | Status: DC
  Filled 2022-09-23: qty 2, 28d supply, fill #0

## 2022-09-23 NOTE — Telephone Encounter (Signed)
Patient advised of providers message and verbalized understanding. Patient states she will let us know if the swelling does not approve.

## 2022-09-23 NOTE — Telephone Encounter (Signed)
Pt c/o swelling/edema: STAT if pt has developed SOB within 24 hours  If swelling, where is the swelling located? Feet and hands  How much weight have you gained and in what time span? About 6 lbs in 7 days  Have you gained 2 pounds in a day or 5 pounds in a week? yes  Do you have a log of your daily weights (if so, list)? Yes, but not with her now  Are you currently taking a fluid pill? yes  Are you currently SOB? no  Have you traveled recently in a car or plane for an extended period of time? no

## 2022-09-23 NOTE — Telephone Encounter (Signed)
Patient came into office to be scheduled for a follow up appt. Patient states she was recently in the hospital for a upper GI bleed. Patient experiencing swelling in her right leg. Patient states she has been watching her fluid intake and has not been SOB. Patient advised to elevate her legs and to monitor her sodium and water intake. Patient would like recommendations on whether she can take an extra dose of her torsemide. Patient appears to have 1-2+ pitting edema in right leg.

## 2022-09-24 ENCOUNTER — Other Ambulatory Visit: Payer: Self-pay

## 2022-09-24 ENCOUNTER — Telehealth: Payer: Self-pay | Admitting: *Deleted

## 2022-09-24 ENCOUNTER — Telehealth: Payer: Self-pay

## 2022-09-24 DIAGNOSIS — I8500 Esophageal varices without bleeding: Secondary | ICD-10-CM

## 2022-09-24 DIAGNOSIS — Z79899 Other long term (current) drug therapy: Secondary | ICD-10-CM

## 2022-09-24 NOTE — Telephone Encounter (Signed)
Procedure clearance faxed to Dr End

## 2022-09-24 NOTE — Telephone Encounter (Signed)
Pt hs appt with Nelwyn Salisbury, NP 10/24/22, though this is after her procedure 10/17/22.

## 2022-09-24 NOTE — Telephone Encounter (Signed)
   Pre-operative Risk Assessment    Patient Name: Annette Foster  DOB: 08-30-1940 MRN: [id number]      Request for Surgical Clearance    Procedure:   EGD  Date of Surgery:  Clearance 10/17/22                                 Surgeon:  DR. DARREN WOHL Surgeon's Group or Practice Name:  [facility] Phone number:  [phone] Fax number:  [phone]   Type of Clearance Requested:   - Medical ; NO MEDICATIONS ARE LISTED AS NEEDING TO BE HELD   Type of Anesthesia:  General    Additional requests/questions:    Elpidio Anis   09/24/2022, 2:17 PM

## 2022-09-24 NOTE — Telephone Encounter (Signed)
I s/w the pt and she tells me she is having still some edema, and weight gain, swelling at the ankles. Pt said She was instructed to increase her Torsemide x 3 days. Pt states she does not feel that this is helping so far and would like to be seen in the office again. I assured the pt that I will send a message to the triage nurse in [place] [facility] [place] office to reach out to her and see where they can get her squeezed in for the edema and pre op clearance. Pt said thank you for the help.

## 2022-09-24 NOTE — Telephone Encounter (Signed)
   Name: Annette Foster  DOB: 04/09/1940  MRN: [id number]  Primary Cardiologist: Yvonne Kendall, MD  Chart reviewed as part of pre-operative protocol coverage. Because of Annette Foster's past medical history and time since last visit, she will require a follow-up telephone visit in order to better assess preoperative cardiovascular risk.  Pre-op covering staff: - Please schedule appointment and call patient to inform them. If patient already had an upcoming appointment within acceptable timeframe, please add "pre-op clearance" to the appointment notes so provider is aware. - Please contact requesting surgeon's office via preferred method (i.e, phone, fax) to inform them of need for appointment prior to surgery.  No medications indicated as needing held.  Sharlene Dory, PA-C  09/24/2022, 2:50 PM

## 2022-09-25 ENCOUNTER — Other Ambulatory Visit: Payer: Self-pay | Admitting: Internal Medicine

## 2022-09-25 NOTE — Telephone Encounter (Signed)
Call placed to the patient. She stated that she has been having bilateral lower extremity edema. This is mainly in the right foot and ankle. She has minor swelling in her left foot.  She was advised on 8/12 to increase her Torsemide to 40 mg once daily for three days. Today she is on day 3 of the increase with no change in the edema. She also takes Spironolactone 12.5 mg once daily.  She elevates her legs when possible and watches her sodium intake. The swelling does not go down overnight.   Weight this morning was 146 lbs. She stated that her normal weight is 139. She denies shortness of breath.   The patient is due for a EGD on 9/5 and needs cardiac clearance.

## 2022-09-26 ENCOUNTER — Ambulatory Visit (INDEPENDENT_AMBULATORY_CARE_PROVIDER_SITE_OTHER): Payer: Medicare PPO | Admitting: Podiatry

## 2022-09-26 ENCOUNTER — Encounter: Payer: Self-pay | Admitting: Podiatry

## 2022-09-26 DIAGNOSIS — M2011 Hallux valgus (acquired), right foot: Secondary | ICD-10-CM

## 2022-09-26 DIAGNOSIS — M2042 Other hammer toe(s) (acquired), left foot: Secondary | ICD-10-CM

## 2022-09-26 DIAGNOSIS — M2041 Other hammer toe(s) (acquired), right foot: Secondary | ICD-10-CM

## 2022-09-26 DIAGNOSIS — B351 Tinea unguium: Secondary | ICD-10-CM | POA: Diagnosis not present

## 2022-09-26 DIAGNOSIS — E119 Type 2 diabetes mellitus without complications: Secondary | ICD-10-CM

## 2022-09-26 DIAGNOSIS — M79674 Pain in right toe(s): Secondary | ICD-10-CM | POA: Diagnosis not present

## 2022-09-26 DIAGNOSIS — E1142 Type 2 diabetes mellitus with diabetic polyneuropathy: Secondary | ICD-10-CM | POA: Diagnosis not present

## 2022-09-26 DIAGNOSIS — M2012 Hallux valgus (acquired), left foot: Secondary | ICD-10-CM

## 2022-09-26 DIAGNOSIS — M79675 Pain in left toe(s): Secondary | ICD-10-CM

## 2022-09-26 MED ORDER — METOLAZONE 2.5 MG PO TABS: 2.5000 mg | ORAL_TABLET | Freq: Every day | ORAL | 0 refills | Status: DC

## 2022-09-26 NOTE — Progress Notes (Signed)
ANNUAL DIABETIC FOOT EXAM  Subjective: Annette Foster presents today annual diabetic foot exam.  Chief Complaint  Patient presents with   Nail Problem    DFC,Referring Provider Jaclyn Shaggy, MD,lov:08/24,A1C:6.6,BS today:147      Patient confirms h/o diabetes.  Patient denies any h/o foot wounds.  Patient denies any numbness, tingling, burning, or pins/needle sensation in feet.  Risk factors: diabetes, HTN, CHF, hyperlipidemia.  Jaclyn Shaggy, MD is patient's PCP.  Past Medical History:  Diagnosis Date   Abdominal pain    Allergy    Cancer (HCC) 2017   breast cancer- Left   Cataract    CHF (congestive heart failure) (HCC)    Collagenous colitis    Diabetes mellitus without complication (HCC)    Diarrhea    Diverticulosis    Dysrhythmia    Fatty liver disease, nonalcoholic    Fibrocystic breast    GERD (gastroesophageal reflux disease)    Heart murmur    Hyperlipidemia    Hypertension    Hypothyroidism    IBS (irritable bowel syndrome)    IDA (iron deficiency anemia)    Personal history of radiation therapy 2017   LEFT BREAST CA   PONV (postoperative nausea and vomiting)    Presence of permanent cardiac pacemaker    Sleep apnea    C-Pap   Patient Active Problem List   Diagnosis Date Noted   Aspiration pneumonia (HCC) 09/11/2022   Adenomatous polyp of colon 09/10/2022   Lower GI bleed 09/08/2022   Leg pain 07/18/2022   Varicose veins with inflammation 07/17/2022   Unspecified injury of head, initial encounter 09/14/2021   Rectal lesion    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   Atrial fibrillation (HCC) 05/17/2021   Pacemaker 03/23/2021   Portal hypertension (HCC)    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 bleeding (HCC)    Chronic heart failure with preserved ejection fraction (HFpEF) (HCC) 04/05/2020   Hepatic cirrhosis (HCC) 04/05/2020   Abnormal EKG 04/05/2020   Allergic rhinitis due to pollen 02/24/2020   Chronic allergic conjunctivitis  02/24/2020   Allergic rhinitis due to animal (cat) (dog) hair and dander 02/24/2020   Allergic rhinitis 02/24/2020   Polyp of transverse colon    Hematochezia 02/17/2020   Chronic anticoagulation 02/17/2020   Anasarca 02/17/2020   Symptomatic sinus bradycardia 02/17/2020   Paroxysmal atrial fibrillation (HCC) 02/17/2020   AKI (acute kidney injury) (HCC) 02/17/2020   History of colitis 02/17/2020   Acute GI bleeding    Atrial fibrillation with RVR (HCC) 02/07/2020   NASH (nonalcoholic steatohepatitis)    Hav (hallux abducto valgus), unspecified laterality 06/03/2019   Acquired bilateral hammer toes 06/03/2019   Iron deficiency 04/26/2019   Right-sided chest wall pain 04/12/2019   Mid back pain on right side 04/12/2019   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 11/03/2017   Recurrent UTI (urinary tract infection) 07/23/2017   Nausea vomiting and diarrhea 01/11/2017   Hypothyroidism 01/11/2017   HTN (hypertension) 01/11/2017   Diabetes (HCC) 01/11/2017   GERD (gastroesophageal reflux disease) 01/11/2017   Other pancytopenia (HCC) 11/14/2016   Monilial vulvitis 11/06/2016   Spongiotic psoriasiform dermatitis 10/08/2016   Status post hysterectomy 06/20/2016   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   Vaginal atrophy 01/12/2016   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   Past Surgical History:  Procedure Laterality Date   ABDOMINAL HYSTERECTOMY     [place] bso   ABDOMINAL SURGERY     APPENDECTOMY  AV NODE ABLATION N/A 05/17/2021   Procedure: AV NODE ABLATION;  Surgeon: Lanier Prude, MD;  Location: [facility] INVASIVE CV LAB;  Service: Cardiovascular;  Laterality: N/A;   BREAST BIOPSY Bilateral 2016   negative   BREAST 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/2015   DCIS, papillary carcinoma in situ   BREAST BIOPSY Left 05/27/2016   BENIGN MAMMARY EPITHELIUM   BREAST BIOPSY Left 11/20/2017   affirm bx x clip BENIGN MAMMARY EPITHELIUM CONSISTENT WITH RAD  THERAPY   BREAST EXCISIONAL BIOPSY Right    NEG 1980's   BREAST LUMPECTOMY Left 10/17/2015   DCIS and papillary carcinoma insitu, clear margins   CARDIAC SURGERY     has replacement valve   CATARACT EXTRACTION Right    CATARACT EXTRACTION W/PHACO Left 01/16/2021   Procedure: CATARACT EXTRACTION PHACO AND INTRAOCULAR LENS PLACEMENT (IOC) LEFT DIABETIC 8.46 00:59.8;  Surgeon: Galen Manila, MD;  Location: [facility];  Service: Ophthalmology;  Laterality: Left;   CHOLECYSTECTOMY     COLONOSCOPY WITH PROPOFOL N/A 03/11/2016   Procedure: COLONOSCOPY WITH PROPOFOL;  Surgeon: Scot Jun, MD;  Location: [facility] ENDOSCOPY;  Service: Endoscopy;  Laterality: N/A;   COLONOSCOPY WITH PROPOFOL N/A 02/18/2020   Procedure: COLONOSCOPY WITH PROPOFOL;  Surgeon: Midge Minium, MD;  Location: [facility] ENDOSCOPY;  Service: Endoscopy;  Laterality: N/A;   COLONOSCOPY WITH PROPOFOL N/A 09/10/2022   Procedure: COLONOSCOPY WITH PROPOFOL;  Surgeon: Wyline Mood, MD;  Location: [facility] ENDOSCOPY;  Service: Gastroenterology;  Laterality: N/A;   ESOPHAGEAL BANDING  09/10/2022   Procedure: ESOPHAGEAL BANDING;  Surgeon: Wyline Mood, MD;  Location: [facility] ENDOSCOPY;  Service: Gastroenterology;;   ESOPHAGOGASTRODUODENOSCOPY (EGD) WITH PROPOFOL N/A 03/11/2016   Procedure: ESOPHAGOGASTRODUODENOSCOPY (EGD) WITH PROPOFOL;  Surgeon: Scot Jun, MD;  Location: [facility] ENDOSCOPY;  Service: Endoscopy;  Laterality: N/A;   ESOPHAGOGASTRODUODENOSCOPY (EGD) WITH PROPOFOL N/A 02/18/2020   Procedure: ESOPHAGOGASTRODUODENOSCOPY (EGD) WITH PROPOFOL;  Surgeon: Midge Minium, MD;  Location: [facility] ENDOSCOPY;  Service: Endoscopy;  Laterality: N/A;   ESOPHAGOGASTRODUODENOSCOPY (EGD) WITH PROPOFOL N/A 04/25/2020   Procedure: ESOPHAGOGASTRODUODENOSCOPY (EGD) WITH PROPOFOL;  Surgeon: Midge Minium, MD;  Location: [facility] ENDOSCOPY;  Service: Endoscopy;  Laterality: N/A;   ESOPHAGOGASTRODUODENOSCOPY (EGD) WITH PROPOFOL N/A 05/23/2020   Procedure:  ESOPHAGOGASTRODUODENOSCOPY (EGD) WITH PROPOFOL;  Surgeon: Midge Minium, MD;  Location: [facility] ENDOSCOPY;  Service: Endoscopy;  Laterality: N/A;   ESOPHAGOGASTRODUODENOSCOPY (EGD) WITH PROPOFOL N/A 09/10/2022   Procedure: ESOPHAGOGASTRODUODENOSCOPY (EGD) WITH PROPOFOL;  Surgeon: Wyline Mood, MD;  Location: [facility] ENDOSCOPY;  Service: Gastroenterology;  Laterality: N/A;   EUS N/A 10/04/2021   Procedure: LOWER ENDOSCOPIC ULTRASOUND (EUS);  Surgeon: Doren Custard, MD;  Location: [facility] ENDOSCOPY;  Service: Gastroenterology;  Laterality: N/A;  [facility]   EYE SURGERY     FINGER ARTHROSCOPY WITH CARPOMETACARPEL (CMC) ARTHROPLASTY Right 09/03/2018   Procedure: CARPOMETACARPEL [facility]) ARTHROPLASTY RIGHT THUMB;  Surgeon: Kennedy Bucker, MD;  Location: [facility] ORS;  Service: Orthopedics;  Laterality: Right;   FLEXIBLE SIGMOIDOSCOPY N/A 09/14/2021   Procedure: FLEXIBLE SIGMOIDOSCOPY;  Surgeon: Midge Minium, MD;  Location: [facility] ENDOSCOPY;  Service: Endoscopy;  Laterality: N/A;  No anesthesia   GANGLION CYST EXCISION Right 09/03/2018   Procedure: REMOVAL GANGLION OF WRIST;  Surgeon: Kennedy Bucker, MD;  Location: [facility] ORS;  Service: Orthopedics;  Laterality: Right;   HARDWARE REMOVAL Right 09/03/2018   Procedure: HARDWARE REMOVAL RIGHT THUMB;  Surgeon: Kennedy Bucker, MD;  Location: [facility] ORS;  Service: Orthopedics;  Laterality: Right;  staple removed   HEMOSTASIS CLIP PLACEMENT  09/10/2022   Procedure: HEMOSTASIS CLIP PLACEMENT;  Surgeon: Wyline Mood, MD;  Location: [facility] ENDOSCOPY;  Service: Gastroenterology;;   HEMOSTASIS CONTROL  09/10/2022   Procedure: HEMOSTASIS CONTROL;  Surgeon: Wyline Mood, MD;  Location: [facility] ENDOSCOPY;  Service: Gastroenterology;;   JOINT REPLACEMENT Left    TKR   left sinusplasty      MASTECTOMY, PARTIAL Left 10/17/2015   Procedure: MASTECTOMY PARTIAL REVISION;  Surgeon: Nadeen Landau, MD;  Location: [facility] ORS;  Service: General;  Laterality: Left;   PACEMAKER IMPLANT N/A 03/23/2021    Procedure: PACEMAKER IMPLANT;  Surgeon: Lanier Prude, MD;  Location: [facility] INVASIVE CV LAB;  Service: Cardiovascular;  Laterality: N/A;   PARTIAL MASTECTOMY WITH NEEDLE LOCALIZATION Left 09/29/2015   Procedure: PARTIAL MASTECTOMY WITH NEEDLE LOCALIZATION;  Surgeon: Nadeen Landau, MD;  Location: [facility] ORS;  Service: General;  Laterality: Left;   POLYPECTOMY  09/10/2022   Procedure: POLYPECTOMY;  Surgeon: Wyline Mood, MD;  Location: [facility] ENDOSCOPY;  Service: Gastroenterology;;   RECTAL EXAM UNDER ANESTHESIA N/A 10/05/2021   Procedure: RECTAL EXAM UNDER ANESTHESIA, ASPIRATION OF RECTAL CYST;  Surgeon: Carolan Shiver, MD;  Location: [facility] ORS;  Service: General;  Laterality: N/A;   TOTAL ABDOMINAL HYSTERECTOMY W/ BILATERAL SALPINGOOPHORECTOMY     Current Outpatient Medications on File Prior to Visit  Medication Sig Dispense Refill   Azelastine HCl 137 MCG/SPRAY SOLN INHALE 1-2 PUFFS IN EACH NOSTRIL NASALLY ONCE A DAY 30 DAY(S)     Cholecalciferol (VITAMIN D) 50 MCG (2000 UT) CAPS Take 2,000 Units by mouth daily.     clobetasol ointment (TEMOVATE) 0.05 % Apply 1 application topically as needed (vaginal irritation).     clotrimazole (LOTRIMIN) 1 % cream Apply 1 Application topically 2 (two) times daily as needed.     cyanocobalamin (,VITAMIN B-12,) 1000 MCG/ML injection 1,000 mcg every 30 (thirty) days.     cyclobenzaprine (FLEXERIL) 5 MG tablet Take 1 tablet by mouth 2 (two) times daily.     dicyclomine (BENTYL) 10 MG capsule Take 10 mg by mouth in the morning, at noon, and at bedtime.     docusate sodium (COLACE) 100 MG capsule Take 100 mg by mouth 3 (three) times daily as needed for moderate constipation.     EPINEPHrine 0.3 mg/0.3 mL IJ SOAJ injection Inject 0.3 mg into the muscle as needed for anaphylaxis.     esomeprazole (NEXIUM) 40 MG capsule Take 40 mg by mouth 2 (two) times daily before a meal.      estradiol (ESTRACE) 0.1 MG/GM vaginal cream Place 1 Applicatorful vaginally  daily as needed (irritation).     fenofibrate 160 MG tablet Take 160 mg by mouth at bedtime.     gabapentin (NEURONTIN) 300 MG capsule Take 300 mg by mouth at bedtime.      hydrOXYzine (ATARAX/VISTARIL) 25 MG tablet Take 25 mg by mouth every 6 (six) hours as needed for anxiety, itching, nausea or vomiting.     levocetirizine (XYZAL) 5 MG tablet Take 5 mg by mouth every evening.     levothyroxine (SYNTHROID, LEVOTHROID) 75 MCG tablet Take 75 mcg by mouth daily before breakfast.      magnesium oxide (MAG-OX) 400 MG tablet Take 400 mg by mouth 2 (two) times daily.      metolazone (ZAROXOLYN) 2.5 MG tablet Take 1 tablet (2.5 mg total) by mouth daily for 2 days. 30 minutes prior to taking torsemide 2 tablet 0   MOUNJARO 7.5 MG/0.5ML Pen Inject 12.5 mg into the skin once a week.     nadolol (CORGARD)  20 MG tablet Take 1 tablet (20 mg total) by mouth daily. 90 tablet 3   NONFORMULARY OR COMPOUNDED ITEM Nifedipine 0.3% plus lidocaine 2% cream apply to rectum BID and after every BM.     NOVOLOG FLEXPEN 100 UNIT/ML FlexPen Inject 48-68 Units into the skin 3 (three) times daily with meals. Based on sliding scale     potassium chloride SA (K-DUR,KLOR-CON) 20 MEQ tablet Take 20 mEq by mouth 2 (two) times daily.      saccharomyces boulardii (FLORASTOR) 250 MG capsule Take 250 mg by mouth 2 (two) times daily.      simvastatin (ZOCOR) 20 MG tablet Take 20 mg by mouth at bedtime.     spironolactone (ALDACTONE) 25 MG tablet TAKE 1/2 TABLET BY MOUTH DAILY 45 tablet 3   tirzepatide (MOUNJARO) 10 MG/0.5ML Pen Inject 10 mg into the skin once a week. 6 mL 1   torsemide (DEMADEX) 20 MG tablet TAKE 1 TABLET BY MOUTH EVERY DAY 90 tablet 0   TRESIBA FLEXTOUCH 200 UNIT/ML FlexTouch Pen 194 Unit(s) SUB-Q Daily     valACYclovir (VALTREX) 500 MG tablet Take 500 mg by mouth 2 (two) times daily as needed (fever blisters).  1   No current facility-administered medications on file prior to visit.    Allergies  Allergen  Reactions   Codeine Itching, Nausea And Vomiting and Other (See Comments)   Demeclocycline Rash   Demerol [Meperidine] Itching and Nausea And Vomiting   Hydrocodone Itching and Nausea And Vomiting   Other Other (See Comments)   Oxycodone Itching and Nausea And Vomiting   Pentazocine Itching, Nausea And Vomiting and Other (See Comments)   Tetracyclines & Related Rash   Coal Tar Extract Other (See Comments)   Hydrocodone-Acetaminophen Other (See Comments)   Salicylic Acid Other (See Comments)   Tetracycline Hcl Other (See Comments)   Fentanyl Itching, Nausea And Vomiting and Other (See Comments)    D/W surgical nurse on 7/23 and pt reportedly tolerated dose during surgery.  Dose was given by MD.  Pt reported that she only gets itching and did not object to receiving med   Social History   Occupational History   Not on file  Tobacco Use   Smoking status: Never   Smokeless tobacco: Never  Vaping Use   Vaping status: Never Used  Substance and Sexual Activity   Alcohol use: No    Alcohol/week: 0.0 standard drinks of alcohol   Drug use: No   Sexual activity: Not Currently    Birth control/protection: Surgical   Family History  Problem Relation Age of Onset   Breast cancer Paternal Grandmother    Colon cancer Father    Diabetes Sister    Diabetes Brother    Heart disease Brother    Prostate cancer Brother    Colon cancer Maternal Uncle    Prostate cancer Brother    Bladder Cancer Brother    Leukemia Mother        all   Ovarian cancer Neg Hx    Kidney cancer Neg Hx    Immunization History  Administered Date(s) Administered   Influenza, High Dose Seasonal PF 12/30/2014, 06/18/2019   Influenza,inj,Quad PF,6+ Mos 11/13/2015, 11/03/2017   PFIZER(Purple Top)SARS-COV-2 Vaccination 03/27/2019, 06/18/2019, 03/02/2020     Review of Systems: Negative except as noted in the HPI.   Objective: There were no vitals filed for this visit.  Shria Booz is a pleasant 82 y.o.  female in NAD. AAO X 3. Vascular Examination:  CFT <3 seconds b/l. DP/PT pulses faintly palpable b/l. Skin temperature gradient warm to warm b/l. No pain with calf compression. No ischemia or gangrene. No cyanosis or clubbing noted b/l. Pedal hair absent. Trace edema noted right foot and right ankle.   Neurological Examination: Sensation grossly intact b/l with 10 gram monofilament. Vibratory sensation intact b/l.   Dermatological Examination: Pedal skin warm and supple b/l.   No open wounds. No interdigital macerations.  Toenails 1-5 b/l thick, discolored, elongated with subungual debris and pain on dorsal palpation.    No hyperkeratotic nor porokeratotic lesions present on today's visit.  Musculoskeletal Examination: Normal muscle strength 5/5 to all lower extremity muscle groups bilaterally. 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Marland Kitchen No pain, crepitus or joint limitation noted with ROM b/l LE.  Patient ambulates independently without assistive aids.  Radiographs: None  Last A1c:      Latest Ref Rng & Units 09/09/2022   12:04 AM  Hemoglobin A1C  Hemoglobin-A1c 4.8 - 5.6 % 7.2    ADA Risk Categorization: Low Risk :  Patient has all of the following: Intact protective sensation No prior foot ulcer  No severe deformity Pedal pulses present  Assessment: 1. Pain due to onychomycosis of toenails of both feet   2. Hallux valgus, acquired, bilateral   3. Acquired hammertoes of both feet   4. Diabetic polyneuropathy associated with type 2 diabetes mellitus (HCC)   5. Encounter for diabetic foot exam [facility])     Plan: -Patient was evaluated and treated. All patient's and/or POA's questions/concerns answered on today's visit. -Diabetic foot examination performed today. -Patient to continue soft, supportive shoe gear daily. -Toenails 1-5 b/l were debrided in length and girth with sterile nail nippers and dremel without iatrogenic  bleeding.  -Patient/POA to call should there be question/concern in the interim. Return in about 3 months (around 12/27/2022).  Freddie Breech, DPM

## 2022-09-26 NOTE — Telephone Encounter (Signed)
Pt made aware of [place] recommendations and verbalized understanding.  Orders placed   Fur[place], [employer], PA-C  Sandi Mariscal, RN20 hours ago (12:49 PM)    We can try metolazone 2.5mg  x 2 days. BMET in a week.

## 2022-09-26 NOTE — Addendum Note (Signed)
Addended by: Parke Poisson on: 09/26/2022 09:31 AM   Modules accepted: Orders

## 2022-10-03 ENCOUNTER — Other Ambulatory Visit
Admission: RE | Admit: 2022-10-03 | Discharge: 2022-10-03 | Disposition: A | Discharge status: Home / Self Care | Payer: Medicare PPO | Source: Ambulatory Visit | Attending: Medical | Admitting: Medical

## 2022-10-03 ENCOUNTER — Encounter: Payer: Self-pay | Admitting: Medical

## 2022-10-03 ENCOUNTER — Ambulatory Visit: Payer: Medicare PPO | Attending: Cardiology | Admitting: Medical

## 2022-10-03 VITALS — BP 108/52 | HR 68 | Ht 63.0 in | Wt 142.2 lb

## 2022-10-03 DIAGNOSIS — I4891 Unspecified atrial fibrillation: Secondary | ICD-10-CM

## 2022-10-03 DIAGNOSIS — R079 Chest pain, unspecified: Secondary | ICD-10-CM

## 2022-10-03 DIAGNOSIS — Z01818 Encounter for other preprocedural examination: Secondary | ICD-10-CM | POA: Diagnosis not present

## 2022-10-03 DIAGNOSIS — R0602 Shortness of breath: Secondary | ICD-10-CM | POA: Diagnosis not present

## 2022-10-03 DIAGNOSIS — Z8719 Personal history of other diseases of the digestive system: Secondary | ICD-10-CM

## 2022-10-03 LAB — CBC
HCT: 31.8 % — ABNORMAL LOW (ref 36.0–46.0)
Hemoglobin: 10.5 g/dL — ABNORMAL LOW (ref 12.0–15.0)
MCH: 30.8 pg (ref 26.0–34.0)
MCHC: 33 g/dL (ref 30.0–36.0)
MCV: 93.3 fL (ref 80.0–100.0)
Platelets: 67 10*3/uL — ABNORMAL LOW (ref 150–400)
RBC: 3.41 MIL/uL — ABNORMAL LOW (ref 3.87–5.11)
RDW: 14.7 % (ref 11.5–15.5)
WBC: 3.7 10*3/uL — ABNORMAL LOW (ref 4.0–10.5)
nRBC: 0 % (ref 0.0–0.2)

## 2022-10-03 MED ORDER — METOPROLOL TARTRATE 100 MG PO TABS
ORAL_TABLET | ORAL | 0 refills | Status: DC
Start: 2022-10-03 — End: 2022-10-04

## 2022-10-03 NOTE — Progress Notes (Signed)
Cardiology Office Note:    Date:  10/04/2022   ID:  Annette Foster, [name] 09/24/1940, MRN [id number]  PCP:  Jaclyn Shaggy, MD  [facility] Cardiologist:  Yvonne Kendall, MD  [facility] Electrophysiologist:  Lanier Prude, MD   Referring MD: Jaclyn Shaggy, MD   Chief Complaint: pre-op eval  History of Present Illness:    Annette Foster is a 82 y.o. female with a hx of  remote valve surgery in the 1970s, HFpEF, paroxysmal afib complicated by GIB on a/c, HTN, HLD, NASH, seizure disorder, OSA, left breast cancer s/p mastectomy who presents for follow-up of afib and valvular heart disease.    Seen by EP for watchman, however left atrial appendage morphology precludes watchman placement. Anticoagulation was not pursued given h/o GIB with esophageal varices.    The patient was referred back to EP for AV junction ablation and  permanent pacemaker. When in afib patient very symptomatic with RVR, when in NSR she is brardycardic. She underwent device implant 03/23/21. Patient underwent AV junction ablation 8 weeks later.  She was not felt to be a good candidate for watchman due to inappropriate left atrial appendage anatomy.  The patient was last seen 04/02/22 and was overal doing well.   The patient was admitted in July 2024 for Acute GI bleed.  She woke up noting spontaneous rectal bleeding.  She was admitted to the hospital and seen by GI.  She underwent EGD and colonoscopy.  EGD showed portal hypertension gastropathy, grade 3 esophageal varices.  Colonoscopy showed 2 polyps which were resected the descending colon and 1 polyp in ascending colon resected.  GI recommended IV octreotide drip for 72 hours and empiric antibiotics for 5 days.  Today, the patient is needing another EGD, scheduled for September 5. She had an episode of chest pain and shortness of breath last night. Her brother's funeral was yesterday, which may have caused cardiac symptoms.  He overall feels tired and is  emotionally drained.  Since last night she denies any further chest pain or shortness of breath.  No lower leg edema, orthopnea, palpitations, PND. She can perform ADLs .  She reports she is unable to walk up a flight of stairs or 1 block.  Past Medical History:  Diagnosis Date   Abdominal pain    Allergy    Cancer (HCC) 2017   breast cancer- Left   Cataract    CHF (congestive heart failure) (HCC)    Collagenous colitis    Diabetes mellitus without complication (HCC)    Diarrhea    Diverticulosis    Dysrhythmia    Fatty liver disease, nonalcoholic    Fibrocystic breast    GERD (gastroesophageal reflux disease)    Heart murmur    Hyperlipidemia    Hypertension    Hypothyroidism    IBS (irritable bowel syndrome)    IDA (iron deficiency anemia)    Personal history of radiation therapy 2017   LEFT BREAST CA   PONV (postoperative nausea and vomiting)    Presence of permanent cardiac pacemaker    Sleep apnea    C-Pap    Past Surgical History:  Procedure Laterality Date   ABDOMINAL HYSTERECTOMY     tah bso   ABDOMINAL SURGERY     APPENDECTOMY     AV NODE ABLATION N/A 05/17/2021   Procedure: AV NODE ABLATION;  Surgeon: Lanier Prude, MD;  [facility];  Service: Cardiovascular;  Laterality: N/A;  BREAST BIOPSY Bilateral 2016   negative   BREAST 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/2015   DCIS, papillary carcinoma in situ   BREAST BIOPSY Left 05/27/2016   BENIGN MAMMARY EPITHELIUM   BREAST BIOPSY Left 11/20/2017   affirm bx x clip BENIGN MAMMARY EPITHELIUM CONSISTENT WITH RAD THERAPY   BREAST EXCISIONAL BIOPSY Right    NEG 1980's   BREAST LUMPECTOMY Left 10/17/2015   DCIS and papillary carcinoma insitu, clear margins   CARDIAC SURGERY     has replacement valve   CATARACT EXTRACTION Right    CATARACT EXTRACTION W/PHACO Left 01/16/2021   Procedure: CATARACT EXTRACTION PHACO AND INTRAOCULAR LENS PLACEMENT (IOC) LEFT DIABETIC 8.46 00:59.8;  Surgeon: Galen Manila,  MD;  Location: [facility];  Service: Ophthalmology;  Laterality: Left;   CHOLECYSTECTOMY     COLONOSCOPY WITH PROPOFOL N/A 03/11/2016   Procedure: COLONOSCOPY WITH PROPOFOL;  Surgeon: Scot Jun, MD;  Location: [facility] ENDOSCOPY;  Service: Endoscopy;  Laterality: N/A;   COLONOSCOPY WITH PROPOFOL N/A 02/18/2020   Procedure: COLONOSCOPY WITH PROPOFOL;  Surgeon: Midge Minium, MD;  Location: [facility] ENDOSCOPY;  Service: Endoscopy;  Laterality: N/A;   COLONOSCOPY WITH PROPOFOL N/A 09/10/2022   Procedure: COLONOSCOPY WITH PROPOFOL;  Surgeon: Wyline Mood, MD;  Location: [facility] ENDOSCOPY;  Service: Gastroenterology;  Laterality: N/A;   ESOPHAGEAL BANDING  09/10/2022   Procedure: ESOPHAGEAL BANDING;  Surgeon: Wyline Mood, MD;  Location: [facility] ENDOSCOPY;  Service: Gastroenterology;;   ESOPHAGOGASTRODUODENOSCOPY (EGD) WITH PROPOFOL N/A 03/11/2016   Procedure: ESOPHAGOGASTRODUODENOSCOPY (EGD) WITH PROPOFOL;  Surgeon: Scot Jun, MD;  Location: [facility] ENDOSCOPY;  Service: Endoscopy;  Laterality: N/A;   ESOPHAGOGASTRODUODENOSCOPY (EGD) WITH PROPOFOL N/A 02/18/2020   Procedure: ESOPHAGOGASTRODUODENOSCOPY (EGD) WITH PROPOFOL;  Surgeon: Midge Minium, MD;  Location: [facility] ENDOSCOPY;  Service: Endoscopy;  Laterality: N/A;   ESOPHAGOGASTRODUODENOSCOPY (EGD) WITH PROPOFOL N/A 04/25/2020   Procedure: ESOPHAGOGASTRODUODENOSCOPY (EGD) WITH PROPOFOL;  Surgeon: Midge Minium, MD;  Location: [facility] ENDOSCOPY;  Service: Endoscopy;  Laterality: N/A;   ESOPHAGOGASTRODUODENOSCOPY (EGD) WITH PROPOFOL N/A 05/23/2020   Procedure: ESOPHAGOGASTRODUODENOSCOPY (EGD) WITH PROPOFOL;  Surgeon: Midge Minium, MD;  Location: [facility] ENDOSCOPY;  Service: Endoscopy;  Laterality: N/A;   ESOPHAGOGASTRODUODENOSCOPY (EGD) WITH PROPOFOL N/A 09/10/2022   Procedure: ESOPHAGOGASTRODUODENOSCOPY (EGD) WITH PROPOFOL;  Surgeon: Wyline Mood, MD;  Location: [facility] ENDOSCOPY;  Service: Gastroenterology;  Laterality: N/A;   EUS N/A 10/04/2021   Procedure:  LOWER ENDOSCOPIC ULTRASOUND (EUS);  Surgeon: Doren Custard, MD;  Location: [facility] ENDOSCOPY;  Service: Gastroenterology;  Laterality: N/A;  [facility]   EYE SURGERY     FINGER ARTHROSCOPY WITH CARPOMETACARPEL (CMC) ARTHROPLASTY Right 09/03/2018   Procedure: CARPOMETACARPEL [facility]) ARTHROPLASTY RIGHT THUMB;  Surgeon: Kennedy Bucker, MD;  Location: [facility] ORS;  Service: Orthopedics;  Laterality: Right;   FLEXIBLE SIGMOIDOSCOPY N/A 09/14/2021   Procedure: FLEXIBLE SIGMOIDOSCOPY;  Surgeon: Midge Minium, MD;  Location: [facility] ENDOSCOPY;  Service: Endoscopy;  Laterality: N/A;  No anesthesia   GANGLION CYST EXCISION Right 09/03/2018   Procedure: REMOVAL GANGLION OF WRIST;  Surgeon: Kennedy Bucker, MD;  Location: [facility] ORS;  Service: Orthopedics;  Laterality: Right;   HARDWARE REMOVAL Right 09/03/2018   Procedure: HARDWARE REMOVAL RIGHT THUMB;  Surgeon: Kennedy Bucker, MD;  Location: [facility] ORS;  Service: Orthopedics;  Laterality: Right;  staple removed   HEMOSTASIS CLIP PLACEMENT  09/10/2022   Procedure: HEMOSTASIS CLIP PLACEMENT;  Surgeon: Wyline Mood, MD;  Location: [facility] ENDOSCOPY;  Service: Gastroenterology;;   HEMOSTASIS CONTROL  09/10/2022   Procedure: HEMOSTASIS CONTROL;  Surgeon: Wyline Mood, MD;  Location: [facility]  ENDOSCOPY;  Service: Gastroenterology;;   JOINT REPLACEMENT Left    TKR   left sinusplasty      MASTECTOMY, PARTIAL Left 10/17/2015   Procedure: MASTECTOMY PARTIAL REVISION;  Surgeon: Nadeen Landau, MD;  Location: [facility] ORS;  Service: General;  Laterality: Left;   PACEMAKER IMPLANT N/A 03/23/2021   Procedure: PACEMAKER IMPLANT;  Surgeon: Lanier Prude, MD;  [facility];  Service: Cardiovascular;  Laterality: N/A;   PARTIAL MASTECTOMY WITH NEEDLE LOCALIZATION Left 09/29/2015   Procedure: PARTIAL MASTECTOMY WITH NEEDLE LOCALIZATION;  Surgeon: Nadeen Landau, MD;  Location: [facility] ORS;  Service: General;  Laterality: Left;   POLYPECTOMY  09/10/2022   Procedure: POLYPECTOMY;   Surgeon: Wyline Mood, MD;  Location: [facility] ENDOSCOPY;  Service: Gastroenterology;;   RECTAL EXAM UNDER ANESTHESIA N/A 10/05/2021   Procedure: RECTAL EXAM UNDER ANESTHESIA, ASPIRATION OF RECTAL CYST;  Surgeon: Carolan Shiver, MD;  Location: [facility] ORS;  Service: General;  Laterality: N/A;   TOTAL ABDOMINAL HYSTERECTOMY W/ BILATERAL SALPINGOOPHORECTOMY      Current Medications: Current Meds  Medication Sig   Azelastine HCl 137 MCG/SPRAY SOLN INHALE 1-2 PUFFS IN EACH NOSTRIL NASALLY ONCE A DAY 30 DAY(S)   Cholecalciferol (VITAMIN D) 50 MCG (2000 UT) CAPS Take 2,000 Units by mouth daily.   clobetasol ointment (TEMOVATE) 0.05 % Apply 1 application topically as needed (vaginal irritation).   clotrimazole (LOTRIMIN) 1 % cream Apply 1 Application topically 2 (two) times daily as needed.   cyanocobalamin (,VITAMIN B-12,) 1000 MCG/ML injection 1,000 mcg every 30 (thirty) days.   cyclobenzaprine (FLEXERIL) 5 MG tablet Take 1 tablet by mouth 2 (two) times daily.   dicyclomine (BENTYL) 10 MG capsule Take 10 mg by mouth in the morning, at noon, and at bedtime.   docusate sodium (COLACE) 100 MG capsule Take 100 mg by mouth 3 (three) times daily as needed for moderate constipation.   EPINEPHrine 0.3 mg/0.3 mL IJ SOAJ injection Inject 0.3 mg into the muscle as needed for anaphylaxis.   esomeprazole (NEXIUM) 40 MG capsule Take 40 mg by mouth 2 (two) times daily before a meal.    estradiol (ESTRACE) 0.1 MG/GM vaginal cream Place 1 Applicatorful vaginally daily as needed (irritation).   fenofibrate 160 MG tablet Take 160 mg by mouth at bedtime.   gabapentin (NEURONTIN) 300 MG capsule Take 300 mg by mouth at bedtime.    hydrOXYzine (ATARAX/VISTARIL) 25 MG tablet Take 25 mg by mouth every 6 (six) hours as needed for anxiety, itching, nausea or vomiting.   levocetirizine (XYZAL) 5 MG tablet Take 5 mg by mouth every evening.   levothyroxine (SYNTHROID, LEVOTHROID) 75 MCG tablet Take 75 mcg by mouth daily before  breakfast.    magnesium oxide (MAG-OX) 400 MG tablet Take 400 mg by mouth 2 (two) times daily.    nadolol (CORGARD) 20 MG tablet Take 1 tablet (20 mg total) by mouth daily.   NONFORMULARY OR COMPOUNDED ITEM Nifedipine 0.3% plus lidocaine 2% cream apply to rectum BID and after every BM.   NOVOLOG FLEXPEN 100 UNIT/ML FlexPen Inject 48-68 Units into the skin 3 (three) times daily with meals. Based on sliding scale   potassium chloride SA (K-DUR,KLOR-CON) 20 MEQ tablet Take 20 mEq by mouth 2 (two) times daily.    saccharomyces boulardii (FLORASTOR) 250 MG capsule Take 250 mg by mouth 2 (two) times daily.    simvastatin (ZOCOR) 20 MG tablet Take 20 mg by mouth at bedtime.   spironolactone (ALDACTONE) 25 MG tablet TAKE  1/2 TABLET BY MOUTH DAILY   tirzepatide (MOUNJARO) 10 MG/0.5ML Pen Inject 10 mg into the skin once a week.   torsemide (DEMADEX) 20 MG tablet TAKE 1 TABLET BY MOUTH EVERY DAY   TRESIBA FLEXTOUCH 200 UNIT/ML FlexTouch Pen 194 Unit(s) SUB-Q Daily   valACYclovir (VALTREX) 500 MG tablet Take 500 mg by mouth 2 (two) times daily as needed (fever blisters).   [DISCONTINUED] metoprolol tartrate (LOPRESSOR) 100 MG tablet TAKE 1 TABLET 2 HR PRIOR TO CARDIAC PROCEDURE     Allergies:   Codeine, Demeclocycline, Demerol [meperidine], Hydrocodone, Other, Oxycodone, Pentazocine, Tetracyclines & related, Coal tar extract, Hydrocodone-acetaminophen, Salicylic acid, Tetracycline hcl, and Fentanyl   Social History   Socioeconomic History   Marital status: Married    Spouse name: Chase Picket   Number of children: Not on file   Years of education: Not on file   Highest education level: Not on file  Occupational History   Not on file  Tobacco Use   Smoking status: Never   Smokeless tobacco: Never  Vaping Use   Vaping status: Never Used  Substance and Sexual Activity   Alcohol use: No    Alcohol/week: 0.0 standard drinks of alcohol   Drug use: No   Sexual activity: Not Currently    Birth  control/protection: Surgical  Other Topics Concern   Not on file  Social History Narrative    Husband lives at nursing home , lives with husbands sister   Social Determinants of Health   Financial Resource Strain: Not on file  Food Insecurity: No Food Insecurity (09/09/2022)   Hunger Vital Sign    Worried About Running Out of Food in the Last Year: Never true    Ran Out of Food in the Last Year: Never true  Transportation Needs: No Transportation Needs (09/09/2022)   PRAPARE - Administrator, Civil Service (Medical): No    Lack of Transportation (Non-Medical): No  Physical Activity: Unknown (01/30/2018)   [facility]   Exercise Vital Sign    Days of Exercise per Week: 7 days    Minutes of Exercise per Session: Not on file  Stress: Not on file  Social Connections: Not on file     Family History: The patient's family history includes Bladder Cancer in her brother; Breast cancer in her paternal grandmother; Colon cancer in her father and maternal uncle; Diabetes in her brother and sister; Heart disease in her brother; Leukemia in her mother; Prostate cancer in her brother and brother. There is no history of Ovarian cancer or Kidney cancer.  ROS:   Please see the history of present illness.     All other systems reviewed and are negative.  EKGs/Labs/Other Studies Reviewed:    The following studies were reviewed today:  Echo 03/2021 1. Left ventricular ejection fraction, by estimation, is 55 to 60%. The  left ventricle has normal function. The left ventricle has no regional  wall motion abnormalities. There is moderate concentric left ventricular  hypertrophy. Left ventricular  diastolic parameters are indeterminate.   2. Right ventricular systolic function is normal. The right ventricular  size is normal. Mildly increased right ventricular wall thickness. There  is normal pulmonary artery systolic pressure. The estimated right   ventricular systolic pressure is 24.7  mmHg.   3. Left atrial size was mildly dilated.   4. A small pericardial effusion is present.   5. The mitral valve is normal in structure. No evidence of mitral  valve  regurgitation. No evidence of mitral stenosis.   6. The aortic valve is normal in structure. Aortic valve regurgitation is  not visualized. Aortic valve sclerosis is present, with no evidence of  aortic valve stenosis.   7. The inferior vena cava is normal in size with greater than 50%  respiratory variability, suggesting right atrial pressure of 3 mmHg.   Calcium score 2022 IMPRESSION: 1. The left atrial appendage is a small chicken wing morphology without thrombus on delayed imaging.   2. Landing zone measurements support a 20 mm Watchman FLX (13% compression), but the LAA length is shallow (maximum length 7.6 mm) and does not appear it will support Watchman for LAAO. Recommend discussion with structural heart team.   3. There is contrast mixing artifact on contrast imaging, but no thrombus on delayed imaging.   4. A small PFO is present.   5. Normal coronary origin. Right dominance.   6. CAC score of 1351, which is 94th percentile for age-, sex-, and race-matched controls.   7. Dilated pulmonary artery, suggestive of pulmonary hypertension.     EKG:  EKG is ordered today.  The ekg ordered today demonstrates AV paced rhythm, TWI inf leads  Recent Labs: 09/08/2022: ALT 16 09/11/2022: TSH 0.466 10/03/2022: BUN 15; Creatinine, Ser 0.85; Hemoglobin 10.5; Platelets 67; Potassium 3.7; Sodium 138  Recent Lipid Panel    Component Value Date/Time   CHOL 160 11/30/2020 0944   TRIG 156 (H) 11/30/2020 0944   HDL 26 (L) 11/30/2020 0944   CHOLHDL 6.2 (H) 11/30/2020 0944   LDLCALC 106 (H) 11/30/2020 0944      Physical Exam:    VS:  BP (!) 108/52 (BP Location: Left Arm, Patient Position: Sitting, Cuff Size: Normal)   Pulse 68   Ht 5\' 3"  (1.6 m)   Wt 142 lb 3.2 oz (64.5  kg)   LMP  (LMP Unknown)   SpO2 98%   BMI 25.19 kg/m     Wt Readings from Last 3 Encounters:  10/03/22 142 lb 3.2 oz (64.5 kg)  09/08/22 139 lb (63 kg)  07/18/22 143 lb 3.2 oz (65 kg)     GEN:  Well nourished, well developed in no acute distress HEENT: Normal NECK: No JVD; No carotid bruits LYMPHATICS: No lymphadenopathy CARDIAC: RRR, no murmurs, rubs, gallops RESPIRATORY:  Clear to auscultation without rales, wheezing or rhonchi  ABDOMEN: Soft, non-tender, non-distended MUSCULOSKELETAL:  No edema; No deformity  SKIN: Warm and dry NEUROLOGIC:  Alert and oriented x 3 PSYCHIATRIC:  Normal affect   ASSESSMENT:    1. Pre-op evaluation   2. Atrial fibrillation with RVR (HCC)   3. Chest pain, unspecified type   4. SOB (shortness of breath)   5. History of GI bleed   6. 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 She reported an episdoe of chest pain and SOB last night. Her brother's funeral was yesterday, which may have been contributing to her symptoms.  Cardiac score in 2022 was 1351.  Echo in 2023 showed LV EF 55 to 60%. EKG today with no changes.  I will repeat an echo and order a cardiac CTA. She is euvolemic on exam.  Patient is not on anticoagulation for A-fib given cirrhosis complicated by  esophageal varices.  She is not a candidate for watchman given unfavorable anatomy.  Most recent device check showed normally functioning device.  I will also check a CBC. METS<4. If echo and cardiac CTA are normal, can undergo EGD. Suspect will be Ok since she had recent EGD/colonoscopy in the hospital.  According to the RCRI patient is low to moderate risk, 6% 30-day risk of death, MI, or cardiac arrest.  Continue nadolol 20 mg daily, simvastatin 20 mg daily,  spironolactone 12.5 mg daily, torsemide 20 mg every other day.  Disposition: Follow up in 6 week(s) with MD/APP    Signed, Tabor Bartram David Stall, PA-C  10/04/2022 9:58 AM     [facility]

## 2022-10-03 NOTE — Patient Instructions (Addendum)
Medication Instructions:  Your Physician recommend you continue on your current medication as directed.    *If you need a refill on your cardiac medications before your next appointment, please call your pharmacy*   Lab Work: Your provider would like for you to have following labs drawn today CBC.   If you have labs (blood work) drawn today and your tests are completely normal, you will receive your results only by: MyChart Message (if you have MyChart) OR A paper copy in the mail If you have any lab test that is abnormal or we need to change your treatment, we will call you to review the results.   Testing/Procedures:  Your physician has requested that you have an echocardiogram. Echocardiography is a painless test that uses sound waves to create images of your heart. It provides your doctor with information about the size and shape of your heart and how well your heart's chambers and valves are working.   You may receive an ultrasound enhancing agent through an IV if needed to better visualize your heart during the echo. This procedure takes approximately one hour.  There are no restrictions for this procedure.  This will take place at [address] [address] [facility], [place] [address]   Your physician has requested that you have cardiac CT. Cardiac computed tomography (CT) is a painless test that uses an x-ray machine to take clear, detailed pictures of your heart. For further information please visit [email or web address] Please follow instruction sheet as given. Please see detailed instructions below.    Follow-Up: At [facility], you and your health needs are our priority.  As part of our continuing mission to provide you with exceptional heart care, we have created designated Provider Care Teams.  These Care Teams include your primary Cardiologist (physician) and Advanced Practice Providers (APPs -  Physician Assistants and Nurse Practitioners) who all work  together to provide you with the care you need, when you need it.  We recommend signing up for the patient portal called "MyChart".  Sign up information is provided on this After Visit Summary.  MyChart is used to connect with patients for Virtual Visits (Telemedicine).  Patients are able to view lab/test results, encounter notes, upcoming appointments, etc.  Non-urgent messages can be sent to your provider as well.   To learn more about what you can do with MyChart, go to [email or web address].    Your next appointment:   Will be at the End of September  Provider:   You may see Yvonne Kendall, MD or one of the following Advanced Practice Providers on your designated Care Team:   Nicolasa Ducking, NP Eula Listen, PA-C Cadence Fransico Michael, PA-C Charlsie Quest, NP      Your cardiac CT will be scheduled at one of the below locations:  [facility] [address] [place], [place] [address] [phone]  OR   [facility] [address] [place], [place] [address] [phone]  If scheduled at [facility] or [facility], please arrive 15 mins early for check-in and test prep.  There is spacious parking and easy access to the radiology department from the [facility] entrance. Please enter here and check-in with the desk attendant.   Please follow these instructions carefully (unless otherwise directed):  An IV will be required for this test and Nitroglycerin will be given.  Hold these medications at least 3 days (72 hrs) prior  to test. (Ie viagra, cialis, sildenafil, tadalafil, etc)   On the Night Before the Test: Be sure to Drink plenty of water. Do not consume any caffeinated/decaffeinated beverages or chocolate 12 hours prior to your test. Do not take any antihistamines 12 hours prior to your test.  On the Day of the Test: Drink plenty of water until 1  hour prior to the test. Do not eat any food 1 hour prior to test. You may take your regular medications prior to the test.  Take metoprolol (Lopressor) two hours prior to test. If you take Furosemide/Hydrochlorothiazide/Spironolactone/Torsemide, please HOLD on the morning of the test. FEMALES- please wear underwire-free bra if available, avoid dresses & tight clothing  Metoprolol tartrate 100 mg PO 90-120 minutes prior to scan       After the Test: Drink plenty of water. After receiving IV contrast, you may experience a mild flushed feeling. This is normal. On occasion, you may experience a mild rash up to 24 hours after the test. This is not dangerous. If this occurs, you can take Benadryl 25 mg and increase your fluid intake. If you experience trouble breathing, this can be serious. If it is severe call 911 IMMEDIATELY. If it is mild, please call our office. If you take any of these medications: Glipizide/Metformin, Avandament, Glucavance, please do not take 48 hours after completing test unless otherwise instructed.  We will call to schedule your test 2-4 weeks out understanding that some insurance companies will need an authorization prior to the service being performed.   For more information and frequently asked questions, please visit our website : [email or web address]  For non-scheduling related questions, please contact the cardiac imaging nurse navigator should you have any questions/concerns: Cardiac Imaging Nurse Navigators Direct Office Dial: [phone]   For scheduling needs, including cancellations and rescheduling, please call [place], [phone].

## 2022-10-04 LAB — BASIC METABOLIC PANEL
BUN/Creatinine Ratio: 18 (ref 12–28)
BUN: 15 mg/dL (ref 8–27)
CO2: 27 mmol/L (ref 20–29)
Calcium: 9 mg/dL (ref 8.7–10.3)
Chloride: 97 mmol/L (ref 96–106)
Creatinine, Ser: 0.85 mg/dL (ref 0.57–1.00)
Glucose: 231 mg/dL — ABNORMAL HIGH (ref 70–99)
Potassium: 3.7 mmol/L (ref 3.5–5.2)
Sodium: 138 mmol/L (ref 134–144)
eGFR: 68 mL/min/{1.73_m2} (ref >59)

## 2022-10-04 MED ORDER — METOPROLOL TARTRATE 50 MG PO TABS
ORAL_TABLET | ORAL | 0 refills | Status: DC
Start: 2022-10-04 — End: 2023-02-26

## 2022-10-04 NOTE — Progress Notes (Signed)
Remote pacemaker transmission.   

## 2022-10-07 ENCOUNTER — Ambulatory Visit: Payer: Medicare PPO | Attending: Medical

## 2022-10-07 DIAGNOSIS — R0602 Shortness of breath: Secondary | ICD-10-CM

## 2022-10-07 LAB — ECHOCARDIOGRAM COMPLETE
AR max vel: 1.74 cm2
AV Area VTI: 1.93 cm2
AV Area mean vel: 1.77 cm2
AV Mean grad: 6.5 mmHg
AV Peak grad: 11.9 mmHg
Ao pk vel: 1.73 m/s
Area-P 1/2: 3.42 cm2
Calc EF: 49.7 %
MV VTI: 1.15 cm2
S' Lateral: 3.2 cm
Single Plane A2C EF: 49.7 %
Single Plane A4C EF: 50 %

## 2022-10-08 ENCOUNTER — Other Ambulatory Visit: Payer: Self-pay

## 2022-10-08 DIAGNOSIS — I517 Cardiomegaly: Secondary | ICD-10-CM

## 2022-10-10 ENCOUNTER — Telehealth: Payer: Self-pay | Admitting: Medical

## 2022-10-10 NOTE — Telephone Encounter (Signed)
Patient is calling to talk with Cadence Furth or nurse in regards to patient having an endoscopy. Please call back to discuss

## 2022-10-10 NOTE — Telephone Encounter (Addendum)
Pt called reporting she received a call stating she will not be able to proceed with endoscopy without cardiac clearance. Pt made aware of below message and results forwarded to Dr. Servando Snare office.     Cadence David Stall, PA-C 10/08/2022  1:31 PM EDT     Echo showed normal pump function, LVH with no obstruction. Patient will need a cMRI. But this does not have to be before her procedure

## 2022-10-11 ENCOUNTER — Telehealth: Payer: Self-pay

## 2022-10-11 NOTE — Telephone Encounter (Signed)
Patient called to cancel appointment on 9/4. Please give her a call back to reschedule. Thanks

## 2022-10-15 ENCOUNTER — Encounter: Payer: Self-pay | Admitting: Internal Medicine

## 2022-10-15 NOTE — Telephone Encounter (Signed)
  Annette David Stall, PA-C 10/08/2022  1:31 PM EDT      Echo showed normal pump function, LVH with no obstruction. Patient will need a cMRI. But this does not have to be before her procedure

## 2022-10-16 ENCOUNTER — Encounter: Payer: Self-pay | Admitting: Gastroenterology

## 2022-10-16 ENCOUNTER — Inpatient Hospital Stay: Payer: Medicare PPO

## 2022-10-16 ENCOUNTER — Inpatient Hospital Stay: Payer: Medicare PPO | Admitting: Internal Medicine

## 2022-10-17 ENCOUNTER — Ambulatory Visit: Payer: Medicare PPO | Admitting: Anesthesiology

## 2022-10-17 ENCOUNTER — Encounter
Admission: RE | Disposition: A | Discharge status: Home / Self Care | Payer: Self-pay | Source: Ambulatory Visit | Attending: Gastroenterology

## 2022-10-17 ENCOUNTER — Encounter: Payer: Self-pay | Admitting: Gastroenterology

## 2022-10-17 ENCOUNTER — Ambulatory Visit
Admission: RE | Admit: 2022-10-17 | Discharge: 2022-10-17 | Disposition: A | Discharge status: Home / Self Care | Payer: Medicare PPO | Source: Ambulatory Visit | Attending: Gastroenterology | Admitting: Gastroenterology

## 2022-10-17 ENCOUNTER — Other Ambulatory Visit: Payer: Self-pay

## 2022-10-17 DIAGNOSIS — K7460 Unspecified cirrhosis of liver: Secondary | ICD-10-CM | POA: Diagnosis present

## 2022-10-17 DIAGNOSIS — I110 Hypertensive heart disease with heart failure: Secondary | ICD-10-CM | POA: Diagnosis not present

## 2022-10-17 DIAGNOSIS — K219 Gastro-esophageal reflux disease without esophagitis: Secondary | ICD-10-CM | POA: Diagnosis not present

## 2022-10-17 DIAGNOSIS — I509 Heart failure, unspecified: Secondary | ICD-10-CM | POA: Insufficient documentation

## 2022-10-17 DIAGNOSIS — E119 Type 2 diabetes mellitus without complications: Secondary | ICD-10-CM | POA: Diagnosis not present

## 2022-10-17 DIAGNOSIS — K3189 Other diseases of stomach and duodenum: Secondary | ICD-10-CM | POA: Diagnosis not present

## 2022-10-17 DIAGNOSIS — I2510 Atherosclerotic heart disease of native coronary artery without angina pectoris: Secondary | ICD-10-CM | POA: Diagnosis not present

## 2022-10-17 DIAGNOSIS — K766 Portal hypertension: Secondary | ICD-10-CM | POA: Diagnosis not present

## 2022-10-17 DIAGNOSIS — I8500 Esophageal varices without bleeding: Secondary | ICD-10-CM

## 2022-10-17 DIAGNOSIS — I851 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 bleeding: Secondary | ICD-10-CM | POA: Diagnosis not present

## 2022-10-17 DIAGNOSIS — E039 Hypothyroidism, unspecified: Secondary | ICD-10-CM | POA: Diagnosis not present

## 2022-10-17 HISTORY — DX: Unspecified cirrhosis of liver: K74.60

## 2022-10-17 HISTORY — DX: Atherosclerotic heart disease of native coronary artery without angina pectoris: I25.10

## 2022-10-17 HISTORY — PX: ESOPHAGEAL BANDING: SHX5518

## 2022-10-17 HISTORY — PX: ESOPHAGOGASTRODUODENOSCOPY (EGD) WITH PROPOFOL: SHX5813

## 2022-10-17 SURGERY — ESOPHAGOGASTRODUODENOSCOPY (EGD) WITH PROPOFOL
Anesthesia: General

## 2022-10-17 MED ORDER — LIDOCAINE HCL (PF) 2 % IJ SOLN
INTRAMUSCULAR | Status: DC | PRN
  Administered 2022-10-17: 50 mg via INTRADERMAL

## 2022-10-17 MED ORDER — LIDOCAINE HCL (PF) 2 % IJ SOLN
INTRAMUSCULAR | Status: AC
  Filled 2022-10-17: qty 5

## 2022-10-17 MED ORDER — PROPOFOL 10 MG/ML IV BOLUS
INTRAVENOUS | Status: DC | PRN
Start: 2022-10-17 — End: 2022-10-17
  Administered 2022-10-17 (×2): 20 mg via INTRAVENOUS
  Administered 2022-10-17: 30 mg via INTRAVENOUS
  Administered 2022-10-17: 50 mg via INTRAVENOUS

## 2022-10-17 MED ORDER — PROPOFOL 1000 MG/100ML IV EMUL
INTRAVENOUS | Status: AC
  Filled 2022-10-17: qty 100

## 2022-10-17 MED ORDER — SODIUM CHLORIDE 0.9 % IV SOLN: INTRAVENOUS | Status: DC

## 2022-10-17 NOTE — Op Note (Signed)
[facility] Patient Name: Annette Foster Procedure Date: 10/17/2022 7:50 AM MRN: [id number] Account #: [id number] Date of Birth: [birth year] Admit Type: Outpatient Age: 82 Room: [facility] ENDO ROOM 4 Gender: Female Note Status: Finalized Instrument Name: Patton Salles Endoscope [id number] Procedure:             Upper GI endoscopy Indications:           Cirrhosis with suspected esophageal varices Providers:             Midge Minium MD, MD Referring MD:          Jillene Bucks. Arlana Pouch, MD (Referring MD) Medicines:             Propofol per Anesthesia Complications:         No immediate complications. Procedure:             Pre-Anesthesia Assessment:                        - Prior to the procedure, a History and Physical was                         performed, and patient medications and allergies were                         reviewed. The patient's tolerance of previous                         anesthesia was also reviewed. The risks and benefits                         of the procedure and the sedation options and risks                         were discussed with the patient. All questions were                         answered, and informed consent was obtained. Prior                         Anticoagulants: The patient has taken no anticoagulant                         or antiplatelet agents. ASA Grade Assessment: II - A                         patient with mild systemic disease. After reviewing                         the risks and benefits, the patient was deemed in                         satisfactory condition to undergo the procedure.                        After obtaining informed consent, the endoscope was                         passed under direct vision. Throughout the procedure,  the patient's blood pressure, pulse, and oxygen                         saturations were monitored continuously. The Endoscope                         was introduced  through the mouth, and advanced to the                         second part of duodenum. The upper GI endoscopy was                         accomplished without difficulty. The patient tolerated                         the procedure well. Findings:      Grade II varices were found in the lower third of the esophagus. Three       bands were successfully placed with complete eradication, resulting in       deflation of varices.      Moderate portal hypertensive gastropathy was found in the entire       examined stomach.      The examined duodenum was normal. Impression:            - Grade II esophageal varices. Completely eradicated.                         Banded.                        - Portal hypertensive gastropathy.                        - Normal examined duodenum.                        - No specimens collected. Recommendation:        - Discharge patient to home.                        - Mechanical soft diet for 3 days.                        - Continue present medications.                        - Repeat upper endoscopy in 4 weeks for retreatment. Procedure Code(s):     --- Professional ---                        [phone], Esophagogastroduodenoscopy, flexible,                         transoral; with band ligation of esophageal/gastric                         varices Diagnosis Code(s):     --- Professional ---                        K74.60, Unspecified cirrhosis of liver CPT copyright 2022 American [employer] Association. All rights reserved. The  codes documented in this report are preliminary and upon coder review may  be revised to meet current compliance requirements. Midge Minium MD, MD 10/17/2022 8:05:37 AM This report has been signed electronically. Number of Addenda: 0 Note Initiated On: 10/17/2022 7:50 AM Estimated Blood Loss:  Estimated blood loss: none.      [facility]

## 2022-10-17 NOTE — Anesthesia Preprocedure Evaluation (Signed)
Anesthesia Evaluation  Patient identified by MRN, date of birth, ID band Patient awake    Reviewed: Allergy & Precautions, NPO status , Patient's Chart, lab work & pertinent test results  History of Anesthesia Complications (+) PONV and history of anesthetic complications  Airway Mallampati: III  TM Distance: <3 FB Neck ROM: full    Dental  (+) Chipped, Poor Dentition, Missing, Partial Lower, Upper Dentures   Pulmonary sleep apnea    Pulmonary exam normal        Cardiovascular hypertension, + CAD and +CHF  Normal cardiovascular exam+ dysrhythmias      Neuro/Psych negative neurological ROS  negative psych ROS   GI/Hepatic ,GERD  Controlled,,(+) Hepatitis -  Endo/Other  diabetesHypothyroidism    Renal/GU Renal disease  negative genitourinary   Musculoskeletal   Abdominal   Peds  Hematology negative hematology ROS (+)   Anesthesia Other Findings Past Medical History: No date: Abdominal pain No date: Allergy 2017: Cancer (HCC)     Comment:  breast cancer- Left No date: Cataract No date: CHF (congestive heart failure) (HCC) No date: Collagenous colitis No date: Coronary artery disease No date: Diabetes mellitus without complication (HCC) No date: Diarrhea No date: Diverticulosis No date: Dysrhythmia No date: Fatty liver disease, nonalcoholic No date: Fibrocystic breast No date: GERD (gastroesophageal reflux disease) No date: Heart murmur No date: Hyperlipidemia No date: Hypertension No date: Hypothyroidism No date: IBS (irritable bowel syndrome) No date: IDA (iron deficiency anemia) No date: Liver cirrhosis (HCC) 2017: Personal history of radiation therapy     Comment:  LEFT BREAST CA No date: PONV (postoperative nausea and vomiting) No date: Presence of permanent cardiac pacemaker No date: Sleep apnea     Comment:  C-Pap  Past Surgical History: No date: ABDOMINAL HYSTERECTOMY     Comment:  [place] No date: ABDOMINAL SURGERY No date: APPENDECTOMY 05/17/2021: AV NODE ABLATION; N/A     Comment:  Procedure: AV NODE ABLATION;  Surgeon: Lanier Prude, MD;  Location: [facility];  Service:               Cardiovascular;  Laterality: N/A; 2016: BREAST BIOPSY; Bilateral     Comment:  negative 09/11/2015: BREAST BIOPSY; Left     Comment:  DCIS, papillary carcinoma in situ 05/27/2016: BREAST BIOPSY; Left     Comment:  BENIGN MAMMARY EPITHELIUM 11/20/2017: BREAST BIOPSY; Left     Comment:  affirm bx x clip BENIGN MAMMARY EPITHELIUM CONSISTENT               WITH RAD THERAPY No date: BREAST EXCISIONAL BIOPSY; Right     Comment:  NEG 1980's 10/17/2015: BREAST LUMPECTOMY; Left     Comment:  DCIS and papillary carcinoma insitu, clear margins No date: CARDIAC SURGERY     Comment:  has replacement valve No date: CATARACT EXTRACTION; Right 01/16/2021: CATARACT EXTRACTION W/PHACO; Left     Comment:  Procedure: CATARACT EXTRACTION PHACO AND INTRAOCULAR               LENS PLACEMENT (IOC) LEFT DIABETIC 8.46 00:59.8;                Surgeon: Galen Manila, MD;  Location: [facility] SURGERY              CNTR;  Service: Ophthalmology;  Laterality: Left; No date: CHOLECYSTECTOMY 03/11/2016: COLONOSCOPY WITH PROPOFOL; N/A     Comment:  Procedure:  COLONOSCOPY WITH PROPOFOL;  Surgeon: Scot Jun, MD;  Location: [facility] ENDOSCOPY;  Service:               Endoscopy;  Laterality: N/A; 02/18/2020: COLONOSCOPY WITH PROPOFOL; N/A     Comment:  Procedure: COLONOSCOPY WITH PROPOFOL;  Surgeon: Midge Minium, MD;  Location: [facility] ENDOSCOPY;  Service:               Endoscopy;  Laterality: N/A; 09/10/2022: COLONOSCOPY WITH PROPOFOL; N/A     Comment:  Procedure: COLONOSCOPY WITH PROPOFOL;  Surgeon: Wyline Mood, MD;  Location: [facility] ENDOSCOPY;  Service:               Gastroenterology;  Laterality: N/A; 09/10/2022: ESOPHAGEAL BANDING     Comment:   Procedure: ESOPHAGEAL BANDING;  Surgeon: Wyline Mood,               MD;  Location: [facility] ENDOSCOPY;  Service:               Gastroenterology;; 03/11/2016: ESOPHAGOGASTRODUODENOSCOPY (EGD) WITH PROPOFOL; N/A     Comment:  Procedure: ESOPHAGOGASTRODUODENOSCOPY (EGD) WITH               PROPOFOL;  Surgeon: Scot Jun, MD;  Location: [facility]              ENDOSCOPY;  Service: Endoscopy;  Laterality: N/A; 02/18/2020: ESOPHAGOGASTRODUODENOSCOPY (EGD) WITH PROPOFOL; N/A     Comment:  Procedure: ESOPHAGOGASTRODUODENOSCOPY (EGD) WITH               PROPOFOL;  Surgeon: Midge Minium, MD;  Location: [facility]               ENDOSCOPY;  Service: Endoscopy;  Laterality: N/A; 04/25/2020: ESOPHAGOGASTRODUODENOSCOPY (EGD) WITH PROPOFOL; N/A     Comment:  Procedure: ESOPHAGOGASTRODUODENOSCOPY (EGD) WITH               PROPOFOL;  Surgeon: Midge Minium, MD;  Location: [facility]               ENDOSCOPY;  Service: Endoscopy;  Laterality: N/A; 05/23/2020: ESOPHAGOGASTRODUODENOSCOPY (EGD) WITH PROPOFOL; N/A     Comment:  Procedure: ESOPHAGOGASTRODUODENOSCOPY (EGD) WITH               PROPOFOL;  Surgeon: Midge Minium, MD;  Location: [facility]               ENDOSCOPY;  Service: Endoscopy;  Laterality: N/A; 09/10/2022: ESOPHAGOGASTRODUODENOSCOPY (EGD) WITH PROPOFOL; N/A     Comment:  Procedure: ESOPHAGOGASTRODUODENOSCOPY (EGD) WITH               PROPOFOL;  Surgeon: Wyline Mood, MD;  Location: [facility]               ENDOSCOPY;  Service: Gastroenterology;  Laterality: N/A; 10/04/2021: EUS; N/A     Comment:  Procedure: LOWER ENDOSCOPIC ULTRASOUND (EUS);  Surgeon:               Doren Custard, MD;  Location: [facility] ENDOSCOPY;                Service: Gastroenterology;  Laterality: N/A;  [facility] No date: EYE SURGERY 09/03/2018: FINGER ARTHROSCOPY WITH CARPOMETACARPEL ([facility])  ARTHROPLASTY; Right     Comment:  Procedure: CARPOMETACARPEL ([facility]) ARTHROPLASTY RIGHT  THUMB;  Surgeon: Kennedy Bucker, MD;  Location: [facility] ORS;                Service: Orthopedics;  Laterality: Right; 09/14/2021: FLEXIBLE SIGMOIDOSCOPY; N/A     Comment:  Procedure: FLEXIBLE SIGMOIDOSCOPY;  Surgeon: Midge Minium, MD;  Location: [facility] ENDOSCOPY;  Service:               Endoscopy;  Laterality: N/A;  No anesthesia 09/03/2018: GANGLION CYST EXCISION; Right     Comment:  Procedure: REMOVAL GANGLION OF WRIST;  Surgeon: Kennedy Bucker, MD;  Location: [facility] ORS;  Service: Orthopedics;               Laterality: Right; 09/03/2018: HARDWARE REMOVAL; Right     Comment:  Procedure: HARDWARE REMOVAL RIGHT THUMB;  Surgeon: Kennedy Bucker, MD;  Location: [facility] ORS;  Service: Orthopedics;               Laterality: Right;  staple removed 09/10/2022: HEMOSTASIS CLIP PLACEMENT     Comment:  Procedure: HEMOSTASIS CLIP PLACEMENT;  Surgeon: Wyline Mood, MD;  Location: [facility] ENDOSCOPY;  Service:               Gastroenterology;; 09/10/2022: HEMOSTASIS CONTROL     Comment:  Procedure: HEMOSTASIS CONTROL;  Surgeon: Wyline Mood,               MD;  Location: [facility] ENDOSCOPY;  Service:               Gastroenterology;; No date: JOINT REPLACEMENT; Left     Comment:  TKR No date: left sinusplasty  10/17/2015: MASTECTOMY, PARTIAL; Left     Comment:  Procedure: MASTECTOMY PARTIAL REVISION;  Surgeon: Nadeen Landau, MD;  Location: [facility] ORS;  Service: General;              Laterality: Left; 03/23/2021: PACEMAKER IMPLANT; N/A     Comment:  Procedure: PACEMAKER IMPLANT;  Surgeon: Lanier Prude, MD;  Location: [facility] CV LAB;  Service:               Cardiovascular;  Laterality: N/A; 09/29/2015: PARTIAL MASTECTOMY WITH NEEDLE LOCALIZATION; Left     Comment:  Procedure: PARTIAL MASTECTOMY WITH NEEDLE LOCALIZATION;               Surgeon: Nadeen Landau, MD;  Location: [facility] ORS;                Service: General;  Laterality: Left; 09/10/2022: POLYPECTOMY     Comment:  Procedure:  POLYPECTOMY;  Surgeon: Wyline Mood, MD;                Location: [facility] ENDOSCOPY;  Service: Gastroenterology;; 10/05/2021: RECTAL EXAM UNDER ANESTHESIA; N/A     Comment:  Procedure: RECTAL EXAM UNDER ANESTHESIA, ASPIRATION OF               RECTAL CYST;  Surgeon: Carolan Shiver, MD;                Location: [facility] ORS;  Service: General;  Laterality: N/A; No date: TOTAL ABDOMINAL HYSTERECTOMY W/ BILATERAL SALPINGOOPHORECTOMY  BMI    Body Mass Index: 25.15 kg/m      Reproductive/Obstetrics negative OB ROS                             Anesthesia Physical Anesthesia Plan  ASA: 3  Anesthesia Plan: General   Post-op Pain Management:    Induction: Intravenous  PONV Risk Score and Plan: Propofol infusion and TIVA  Airway Management Planned: Natural Airway and Nasal Cannula  Additional Equipment:   Intra-op Plan:   Post-operative Plan:   Informed Consent: I have reviewed the patients History and Physical, chart, labs and discussed the procedure including the risks, benefits and alternatives for the proposed anesthesia with the patient or authorized representative who has indicated his/her understanding and acceptance.     Dental Advisory Given  Plan Discussed with: Anesthesiologist, CRNA and Surgeon  Anesthesia Plan Comments: (Patient consented for risks of anesthesia including but not limited to:  - adverse reactions to medications - risk of airway placement if required - damage to eyes, teeth, lips or other oral mucosa - nerve damage due to positioning  - sore throat or hoarseness - Damage to heart, brain, nerves, lungs, other parts of body or loss of life  Patient voiced understanding.)       Anesthesia Quick Evaluation

## 2022-10-17 NOTE — H&P (Signed)
Midge Minium, MD [facility] [address] [place], [place] [address] Phone:[phone] Fax : [phone]  Primary Care Physician:  Jaclyn Shaggy, MD Primary Gastroenterologist:  Dr. Servando Snare  Pre-Procedure History & Physical: HPI:  Annette Foster is a 82 y.o. female is here for an endoscopy.   Past Medical History:  Diagnosis Date   Abdominal pain    Allergy    Cancer (HCC) 2017   breast cancer- Left   Cataract    CHF (congestive heart failure) (HCC)    Collagenous colitis    Coronary artery disease    Diabetes mellitus without complication (HCC)    Diarrhea    Diverticulosis    Dysrhythmia    Fatty liver disease, nonalcoholic    Fibrocystic breast    GERD (gastroesophageal reflux disease)    Heart murmur    Hyperlipidemia    Hypertension    Hypothyroidism    IBS (irritable bowel syndrome)    IDA (iron deficiency anemia)    Liver cirrhosis (HCC)    Personal history of radiation therapy 2017   LEFT BREAST CA   PONV (postoperative nausea and vomiting)    Presence of permanent cardiac pacemaker    Sleep apnea    C-Pap    Past Surgical History:  Procedure Laterality Date   ABDOMINAL HYSTERECTOMY     tah bso   ABDOMINAL SURGERY     APPENDECTOMY     AV NODE ABLATION N/A 05/17/2021   Procedure: AV NODE ABLATION;  Surgeon: Lanier Prude, MD;  [facility];  Service: Cardiovascular;  Laterality: N/A;   BREAST BIOPSY Bilateral 2016   negative   BREAST 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/2015   DCIS, papillary carcinoma in situ   BREAST BIOPSY Left 05/27/2016   BENIGN MAMMARY EPITHELIUM   BREAST BIOPSY Left 11/20/2017   affirm bx x clip BENIGN MAMMARY EPITHELIUM CONSISTENT WITH RAD THERAPY   BREAST EXCISIONAL BIOPSY Right    NEG 1980's   BREAST LUMPECTOMY Left 10/17/2015   DCIS and papillary carcinoma insitu, clear margins   CARDIAC SURGERY     has replacement valve   CATARACT EXTRACTION Right    CATARACT EXTRACTION W/PHACO Left 01/16/2021    Procedure: CATARACT EXTRACTION PHACO AND INTRAOCULAR LENS PLACEMENT (IOC) LEFT DIABETIC 8.46 00:59.8;  Surgeon: Galen Manila, MD;  Location: [facility];  Service: Ophthalmology;  Laterality: Left;   CHOLECYSTECTOMY     COLONOSCOPY WITH PROPOFOL N/A 03/11/2016   Procedure: COLONOSCOPY WITH PROPOFOL;  Surgeon: Scot Jun, MD;  Location: [facility] ENDOSCOPY;  Service: Endoscopy;  Laterality: N/A;   COLONOSCOPY WITH PROPOFOL N/A 02/18/2020   Procedure: COLONOSCOPY WITH PROPOFOL;  Surgeon: Midge Minium, MD;  Location: [facility] ENDOSCOPY;  Service: Endoscopy;  Laterality: N/A;   COLONOSCOPY WITH PROPOFOL N/A 09/10/2022   Procedure: COLONOSCOPY WITH PROPOFOL;  Surgeon: Wyline Mood, MD;  Location: [facility] ENDOSCOPY;  Service: Gastroenterology;  Laterality: N/A;   ESOPHAGEAL BANDING  09/10/2022   Procedure: ESOPHAGEAL BANDING;  Surgeon: Wyline Mood, MD;  Location: [facility] ENDOSCOPY;  Service: Gastroenterology;;   ESOPHAGOGASTRODUODENOSCOPY (EGD) WITH PROPOFOL N/A 03/11/2016   Procedure: ESOPHAGOGASTRODUODENOSCOPY (EGD) WITH PROPOFOL;  Surgeon: Scot Jun, MD;  Location: [facility] ENDOSCOPY;  Service: Endoscopy;  Laterality: N/A;   ESOPHAGOGASTRODUODENOSCOPY (EGD) WITH PROPOFOL N/A 02/18/2020   Procedure: ESOPHAGOGASTRODUODENOSCOPY (EGD) WITH PROPOFOL;  Surgeon: Midge Minium, MD;  Location: [facility] ENDOSCOPY;  Service: Endoscopy;  Laterality: N/A;   ESOPHAGOGASTRODUODENOSCOPY (EGD) WITH PROPOFOL N/A 04/25/2020   Procedure: ESOPHAGOGASTRODUODENOSCOPY (EGD) WITH PROPOFOL;  Surgeon: Midge Minium, MD;  Location: [facility] ENDOSCOPY;  Service: Endoscopy;  Laterality: N/A;   ESOPHAGOGASTRODUODENOSCOPY (EGD) WITH PROPOFOL N/A 05/23/2020   Procedure: ESOPHAGOGASTRODUODENOSCOPY (EGD) WITH PROPOFOL;  Surgeon: Midge Minium, MD;  Location: [facility] ENDOSCOPY;  Service: Endoscopy;  Laterality: N/A;   ESOPHAGOGASTRODUODENOSCOPY (EGD) WITH PROPOFOL N/A 09/10/2022   Procedure: ESOPHAGOGASTRODUODENOSCOPY (EGD) WITH PROPOFOL;   Surgeon: Wyline Mood, MD;  Location: [facility] ENDOSCOPY;  Service: Gastroenterology;  Laterality: N/A;   EUS N/A 10/04/2021   Procedure: LOWER ENDOSCOPIC ULTRASOUND (EUS);  Surgeon: Doren Custard, MD;  Location: [facility] ENDOSCOPY;  Service: Gastroenterology;  Laterality: N/A;  [facility]   EYE SURGERY     FINGER ARTHROSCOPY WITH CARPOMETACARPEL (CMC) ARTHROPLASTY Right 09/03/2018   Procedure: CARPOMETACARPEL [facility]) ARTHROPLASTY RIGHT THUMB;  Surgeon: Kennedy Bucker, MD;  Location: [facility] ORS;  Service: Orthopedics;  Laterality: Right;   FLEXIBLE SIGMOIDOSCOPY N/A 09/14/2021   Procedure: FLEXIBLE SIGMOIDOSCOPY;  Surgeon: Midge Minium, MD;  Location: [facility] ENDOSCOPY;  Service: Endoscopy;  Laterality: N/A;  No anesthesia   GANGLION CYST EXCISION Right 09/03/2018   Procedure: REMOVAL GANGLION OF WRIST;  Surgeon: Kennedy Bucker, MD;  Location: [facility] ORS;  Service: Orthopedics;  Laterality: Right;   HARDWARE REMOVAL Right 09/03/2018   Procedure: HARDWARE REMOVAL RIGHT THUMB;  Surgeon: Kennedy Bucker, MD;  Location: [facility] ORS;  Service: Orthopedics;  Laterality: Right;  staple removed   HEMOSTASIS CLIP PLACEMENT  09/10/2022   Procedure: HEMOSTASIS CLIP PLACEMENT;  Surgeon: Wyline Mood, MD;  Location: [facility] ENDOSCOPY;  Service: Gastroenterology;;   HEMOSTASIS CONTROL  09/10/2022   Procedure: HEMOSTASIS CONTROL;  Surgeon: Wyline Mood, MD;  Location: [facility] ENDOSCOPY;  Service: Gastroenterology;;   JOINT REPLACEMENT Left    TKR   left sinusplasty      MASTECTOMY, PARTIAL Left 10/17/2015   Procedure: MASTECTOMY PARTIAL REVISION;  Surgeon: Nadeen Landau, MD;  Location: [facility] ORS;  Service: General;  Laterality: Left;   PACEMAKER IMPLANT N/A 03/23/2021   Procedure: PACEMAKER IMPLANT;  Surgeon: Lanier Prude, MD;  [facility];  Service: Cardiovascular;  Laterality: N/A;   PARTIAL MASTECTOMY WITH NEEDLE LOCALIZATION Left 09/29/2015   Procedure: PARTIAL MASTECTOMY WITH NEEDLE LOCALIZATION;  Surgeon:  Nadeen Landau, MD;  Location: [facility] ORS;  Service: General;  Laterality: Left;   POLYPECTOMY  09/10/2022   Procedure: POLYPECTOMY;  Surgeon: Wyline Mood, MD;  Location: [facility] ENDOSCOPY;  Service: Gastroenterology;;   RECTAL EXAM UNDER ANESTHESIA N/A 10/05/2021   Procedure: RECTAL EXAM UNDER ANESTHESIA, ASPIRATION OF RECTAL CYST;  Surgeon: Carolan Shiver, MD;  Location: [facility] ORS;  Service: General;  Laterality: N/A;   TOTAL ABDOMINAL HYSTERECTOMY W/ BILATERAL SALPINGOOPHORECTOMY      Prior to Admission medications   Medication Sig Start Date End Date Taking? Authorizing Provider  Azelastine HCl 137 MCG/SPRAY SOLN INHALE 1-2 PUFFS IN EACH NOSTRIL NASALLY ONCE A DAY 30 DAY(S)   Yes [provider]  Cholecalciferol (VITAMIN D) 50 MCG (2000 UT) CAPS Take 2,000 Units by mouth daily.   Yes [provider]  dicyclomine (BENTYL) 10 MG capsule Take 10 mg by mouth in the morning, at noon, and at bedtime. 05/16/19  Yes [provider]  esomeprazole (NEXIUM) 40 MG capsule Take 40 mg by mouth 2 (two) times daily before a meal.  02/08/16  Yes [provider]  fenofibrate 160 MG tablet Take 160 mg by mouth at bedtime.   Yes [provider]  gabapentin (NEURONTIN) 300 MG capsule Take 300 mg by mouth at bedtime.  09/07/16  10/25/36 Yes [provider]  hydrOXYzine (ATARAX/VISTARIL) 25 MG tablet Take 25 mg by mouth every 6 (six) hours as needed for anxiety, itching, nausea or vomiting.   Yes [provider]  levocetirizine (XYZAL) 5 MG tablet Take 5 mg by mouth every evening.   Yes [provider]  levothyroxine (SYNTHROID, LEVOTHROID) 75 MCG tablet Take 75 mcg by mouth daily before breakfast.    Yes [provider]  magnesium oxide (MAG-OX) 400 MG tablet Take 400 mg by mouth 2 (two) times daily.    Yes [provider]  metoprolol tartrate (LOPRESSOR) 50 MG tablet TAKE 1 TABLET 2 HR PRIOR TO CARDIAC PROCEDURE 10/04/22  Yes  Furth, Cadence H, PA-C  nadolol (CORGARD) 20 MG tablet Take 1 tablet (20 mg total) by mouth daily. 08/14/22  Yes End, Cristal Deer, MD  NOVOLOG FLEXPEN 100 UNIT/ML FlexPen Inject 48-68 Units into the skin 3 (three) times daily with meals. Based on sliding scale 08/14/19  Yes [provider]  potassium chloride SA (K-DUR,KLOR-CON) 20 MEQ tablet Take 20 mEq by mouth 2 (two) times daily.    Yes [provider]  saccharomyces boulardii (FLORASTOR) 250 MG capsule Take 250 mg by mouth 2 (two) times daily.    Yes [provider]  simvastatin (ZOCOR) 20 MG tablet Take 20 mg by mouth at bedtime.   Yes [provider]  spironolactone (ALDACTONE) 25 MG tablet TAKE 1/2 TABLET BY MOUTH DAILY 05/03/22  Yes End, Cristal Deer, MD  torsemide (DEMADEX) 20 MG tablet TAKE 1 TABLET BY MOUTH EVERY DAY 03/02/21  Yes End, Cristal Deer, MD  clobetasol ointment (TEMOVATE) 0.05 % Apply 1 application topically as needed (vaginal irritation).    [provider]  clotrimazole (LOTRIMIN) 1 % cream Apply 1 Application topically 2 (two) times daily as needed.    [provider]  cyanocobalamin (,VITAMIN B-12,) 1000 MCG/ML injection 1,000 mcg every 30 (thirty) days. 04/23/19   [provider]  cyclobenzaprine (FLEXERIL) 5 MG tablet Take 1 tablet by mouth 2 (two) times daily. Patient not taking: Reported on 10/17/2022    [provider]  docusate sodium (COLACE) 100 MG capsule Take 100 mg by mouth 3 (three) times daily as needed for moderate constipation. 01/10/20   [provider]  EPINEPHrine 0.3 mg/0.3 mL IJ SOAJ injection Inject 0.3 mg into the muscle as needed for anaphylaxis. 05/18/19   [provider]  estradiol (ESTRACE) 0.1 MG/GM vaginal cream Place 1 Applicatorful vaginally daily as needed (irritation).    [provider]  MOUNJARO 7.5 MG/0.5ML Pen Inject 12.5 mg into the skin once a week. Patient not taking: Reported on 10/03/2022 02/15/22    [provider]  NONFORMULARY OR COMPOUNDED ITEM Nifedipine 0.3% plus lidocaine 2% cream apply to rectum BID and after every BM.    [provider]  tirzepatide Greggory Keen) 10 MG/0.5ML Pen Inject 10 mg into the skin once a week. 09/23/22     TRESIBA FLEXTOUCH 200 UNIT/ML FlexTouch Pen 194 Unit(s) SUB-Q Daily    [provider]  valACYclovir (VALTREX) 500 MG tablet Take 500 mg by mouth 2 (two) times daily as needed (fever blisters). Patient not taking: Reported on 10/17/2022 01/06/17   [provider]    Allergies as of 09/24/2022 - Review Complete 09/10/2022  Allergen Reaction Noted   Codeine Itching, Nausea And Vomiting, and Other (See Comments) 06/28/2014   Demeclocycline Rash 04/10/2012   Demerol [meperidine] Itching and Nausea And Vomiting 06/28/2014   Hydrocodone Itching and  Nausea And Vomiting 08/24/2018   Other Other (See Comments) 04/10/2012   Oxycodone Itching and Nausea And Vomiting 09/26/2015   Pentazocine Itching, Nausea And Vomiting, and Other (See Comments) 04/10/2012   Tetracyclines & related Rash 06/28/2014   Coal tar extract Other (See Comments) 03/19/2022   Hydrocodone-acetaminophen Other (See Comments) 01/30/2018   Salicylic acid Other (See Comments) 04/02/2022   Tetracycline hcl Other (See Comments) 04/02/2022   Fentanyl Itching, Nausea And Vomiting, and Other (See Comments) 06/28/2014    Family History  Problem Relation Age of Onset   Breast cancer Paternal Grandmother    Colon cancer Father    Diabetes Sister    Diabetes Brother    Heart disease Brother    Prostate cancer Brother    Colon cancer Maternal Uncle    Prostate cancer Brother    Bladder Cancer Brother    Leukemia Mother        all   Ovarian cancer Neg Hx    Kidney cancer Neg Hx     Social History   Socioeconomic History   Marital status: Married    Spouse name: Chase Picket   Number of children: Not on file   Years of education: Not on file   Highest education  level: Not on file  Occupational History   Not on file  Tobacco Use   Smoking status: Never   Smokeless tobacco: Never  Vaping Use   Vaping status: Never Used  Substance and Sexual Activity   Alcohol use: No    Alcohol/week: 0.0 standard drinks of alcohol   Drug use: No   Sexual activity: Not Currently    Birth control/protection: Surgical  Other Topics Concern   Not on file  Social History Narrative    Husband lives at nursing home , lives with husbands sister   Social Determinants of Health   Financial Resource Strain: Not on file  Food Insecurity: No Food Insecurity (09/09/2022)   Hunger Vital Sign    Worried About Running Out of Food in the Last Year: Never true    Ran Out of Food in the Last Year: Never true  Transportation Needs: No Transportation Needs (09/09/2022)   PRAPARE - Administrator, Civil Service (Medical): No    Lack of Transportation (Non-Medical): No  Physical Activity: Unknown (01/30/2018)   [facility]   Exercise Vital Sign    Days of Exercise per Week: 7 days    Minutes of Exercise per Session: Not on file  Stress: Not on file  Social Connections: Not on file  Intimate Partner Violence: Not At Risk (09/09/2022)   Humiliation, Afraid, Rape, and Kick questionnaire    Fear of Current or Ex-Partner: No    Emotionally Abused: No    Physically Abused: No    Sexually Abused: No    Review of Systems: See HPI, otherwise negative ROS  Physical Exam: BP (!) 126/57   Pulse 69   Temp (!) 96.6 F (35.9 C) (Temporal)   Resp 18   Ht 5\' 3"  (1.6 m)   Wt 64.4 kg   LMP  (LMP Unknown)   SpO2 100%   BMI 25.15 kg/m  General:   Alert,  pleasant and cooperative in NAD Head:  Normocephalic and atraumatic. Neck:  Supple; no masses or thyromegaly. Lungs:  Clear throughout to auscultation.    Heart:  Regular rate and rhythm. Abdomen:  Soft, nontender and nondistended. Normal bowel sounds, without guarding, and  without rebound.   Neurologic:  Alert and  oriented x4;  grossly normal neurologically.  Impression/Plan: Annette Foster is here for an endoscopy to be performed for cirrhosis  Risks, benefits, limitations, and alternatives regarding  endoscopy have been reviewed with the patient.  Questions have been answered.  All parties agreeable.   Midge Minium, MD  10/17/2022, 7:40 AM

## 2022-10-17 NOTE — Anesthesia Postprocedure Evaluation (Signed)
Anesthesia Post Note  Patient: Annette Foster  Procedure(s) Performed: ESOPHAGOGASTRODUODENOSCOPY (EGD) WITH PROPOFOL ESOPHAGEAL BANDING  Patient location during evaluation: Endoscopy Anesthesia Type: General Level of consciousness: awake and alert Pain management: pain level controlled Vital Signs Assessment: post-procedure vital signs reviewed and stable Respiratory status: spontaneous breathing, nonlabored ventilation, respiratory function stable and patient connected to nasal cannula oxygen Cardiovascular status: blood pressure returned to baseline and stable Postop Assessment: no apparent nausea or vomiting Anesthetic complications: no   No notable events documented.   Last Vitals:  Vitals:   10/17/22 0805 10/17/22 0825  BP: (!) 91/44 (!) 145/68  Pulse:    Resp: 12   Temp: (!) 36.1 C   SpO2:      Last Pain:  Vitals:   10/17/22 0825  TempSrc:   PainSc: 0-No pain                 Cleda Mccreedy [name]

## 2022-10-17 NOTE — Transfer of Care (Signed)
Immediate Anesthesia Transfer of Care Note  Patient: Annette Foster  Procedure(s) Performed: ESOPHAGOGASTRODUODENOSCOPY (EGD) WITH PROPOFOL ESOPHAGEAL BANDING  Patient Location: PACU and Endoscopy Unit  Anesthesia Type:MAC  Level of Consciousness: drowsy  Airway & Oxygen Therapy: Patient Spontanous Breathing and Patient connected to nasal cannula oxygen  Post-op Assessment: Report given to RN and Post -op Vital signs reviewed and stable  Post vital signs: Reviewed and stable  Last Vitals:  Vitals Value Taken Time  BP 91/44 10/17/22 0806  Temp 36.1 C 10/17/22 0805  Pulse 81 10/17/22 0807  Resp 24 10/17/22 0807  SpO2 95 % 10/17/22 0807  Vitals shown include unfiled device data.  Last Pain:  Vitals:   10/17/22 0805  TempSrc: Temporal  PainSc: Asleep         Complications: No notable events documented.

## 2022-10-18 ENCOUNTER — Ambulatory Visit: Payer: Medicare PPO

## 2022-10-18 ENCOUNTER — Other Ambulatory Visit: Payer: Medicare PPO

## 2022-10-18 ENCOUNTER — Ambulatory Visit: Payer: Medicare PPO | Admitting: Internal Medicine

## 2022-10-18 ENCOUNTER — Encounter: Payer: Self-pay | Admitting: Gastroenterology

## 2022-10-21 NOTE — Group Note (Deleted)

## 2022-10-23 ENCOUNTER — Telehealth (HOSPITAL_COMMUNITY): Payer: Self-pay | Admitting: Emergency Medicine

## 2022-10-23 NOTE — Telephone Encounter (Signed)
Reaching out to patient to offer assistance regarding upcoming cardiac imaging study; pt verbalizes understanding of appt date/time, parking situation and where to check in, pre-test NPO status and medications ordered, and verified current allergies; name and call back number provided for further questions should they arise Sara Wallace RN Navigator Cardiac Imaging [facility] [phone] office [phone] cell 

## 2022-10-24 ENCOUNTER — Ambulatory Visit: Payer: Medicare PPO | Admitting: Cardiology

## 2022-10-24 ENCOUNTER — Ambulatory Visit
Admission: RE | Admit: 2022-10-24 | Discharge: 2022-10-24 | Disposition: A | Discharge status: Home / Self Care | Payer: Medicare PPO | Source: Ambulatory Visit | Attending: Medical | Admitting: Medical

## 2022-10-24 ENCOUNTER — Other Ambulatory Visit: Payer: Self-pay | Admitting: Medical

## 2022-10-24 ENCOUNTER — Encounter: Payer: Self-pay | Admitting: Internal Medicine

## 2022-10-24 DIAGNOSIS — R079 Chest pain, unspecified: Secondary | ICD-10-CM | POA: Insufficient documentation

## 2022-10-24 DIAGNOSIS — I2510 Atherosclerotic heart disease of native coronary artery without angina pectoris: Secondary | ICD-10-CM | POA: Diagnosis not present

## 2022-10-24 DIAGNOSIS — R000 Tachycardia, unspecified: Secondary | ICD-10-CM | POA: Diagnosis not present

## 2022-10-24 DIAGNOSIS — Z5309 Procedure and treatment not carried out because of other contraindication: Secondary | ICD-10-CM | POA: Diagnosis not present

## 2022-10-24 DIAGNOSIS — Z8719 Personal history of other diseases of the digestive system: Secondary | ICD-10-CM

## 2022-10-24 DIAGNOSIS — R031 Nonspecific low blood-pressure reading: Secondary | ICD-10-CM | POA: Diagnosis not present

## 2022-10-24 DIAGNOSIS — Z01818 Encounter for other preprocedural examination: Secondary | ICD-10-CM

## 2022-10-24 DIAGNOSIS — I4891 Unspecified atrial fibrillation: Secondary | ICD-10-CM

## 2022-10-24 DIAGNOSIS — R0602 Shortness of breath: Secondary | ICD-10-CM

## 2022-10-24 MED ORDER — IOHEXOL 350 MG/ML SOLN
75.0000 mL | Freq: Once | INTRAVENOUS | Status: AC | PRN
  Administered 2022-10-24: 75 mL via INTRAVENOUS

## 2022-10-24 MED ORDER — SODIUM CHLORIDE 0.9 % IV BOLUS
150.0000 mL | Freq: Once | INTRAVENOUS | Status: AC
  Administered 2022-10-24: 150 mL via INTRAVENOUS

## 2022-10-24 MED ORDER — NITROGLYCERIN 0.4 MG SL SUBL
0.4000 mg | SUBLINGUAL_TABLET | Freq: Once | SUBLINGUAL | Status: AC
  Administered 2022-10-24: 0.4 mg via SUBLINGUAL

## 2022-10-24 MED ORDER — METOPROLOL TARTRATE 5 MG/5ML IV SOLN: 10.0000 mg | Freq: Once | INTRAVENOUS | Status: DC | PRN

## 2022-10-24 NOTE — Progress Notes (Signed)
Unable to complete study. Patient HR elevated, BP low. Dr. Myriam Forehand notified - cancel study, consider alternat testing. Explained to patient, very understanding. A&O, denies dizzy, water given, all questions answered. Ambulate w/o difficulty. ABC intact, no further needs.

## 2022-10-24 NOTE — Telephone Encounter (Signed)
error 

## 2022-10-31 ENCOUNTER — Telehealth: Payer: Self-pay | Admitting: Gastroenterology

## 2022-10-31 ENCOUNTER — Telehealth: Payer: Self-pay | Admitting: Cardiology

## 2022-10-31 NOTE — Telephone Encounter (Signed)
Patient states her CT was cancelled because her BP was elevated and HR decreased. She would like a call back to discuss.

## 2022-10-31 NOTE — Telephone Encounter (Signed)
Patient called in to schedule her EGD and the patient asked if Dr.Wohl nurse will call her to schedule.

## 2022-10-31 NOTE — Telephone Encounter (Signed)
Pt called stating she was unable to complete Cardiac CTA due to low blood pressure and elevated heart rate. Pt questioning as to what Cadence Furth recommends further. Pt stated she does not have a BP machine for updated vitals. However, she denies symptoms of feeling lightheaded currently.   Per CT nurse note on 10/24/22, BP was 93/47 HR 79.   Will forward to PA for recommendations.

## 2022-11-01 ENCOUNTER — Telehealth: Payer: Self-pay

## 2022-11-01 ENCOUNTER — Telehealth: Payer: Self-pay | Admitting: Internal Medicine

## 2022-11-01 NOTE — Telephone Encounter (Signed)
Pt made aware and verbalized understanding.

## 2022-11-01 NOTE — Telephone Encounter (Signed)
Pt is aware that I will need to obtain a cardiac clearance before scheduling procedure... pt expressed understanding  Clearance faxed to Dr End

## 2022-11-01 NOTE — Telephone Encounter (Signed)
Pre-operative Risk Assessment    Patient Name: Annette Foster  DOB: 22-Nov-1940 MRN: [id number]      Request for Surgical Clearance    Procedure:  EGD  Date of Surgery:  Clearance TBD                                 Surgeon:  not indicated  Surgeon's Group or Practice Name:  [facility] Phone number:  [phone] Fax number:  [phone]   Type of Clearance Requested:   - Medical    Type of Anesthesia:  General    Additional requests/questions:    Signed, Lauralee Evener V   11/01/2022, 1:30 PM

## 2022-11-01 NOTE — Telephone Encounter (Signed)
Left a message for the patient to call back.    Furth, Cadence H, PA-C  You57 minutes ago (8:46 AM)    We also ordered an echo, so we will be Ok with this. We can discuss at follow-up.

## 2022-11-04 ENCOUNTER — Inpatient Hospital Stay: Payer: Medicare PPO | Attending: Internal Medicine

## 2022-11-04 ENCOUNTER — Inpatient Hospital Stay: Payer: Medicare PPO

## 2022-11-04 ENCOUNTER — Encounter: Payer: Self-pay | Admitting: Internal Medicine

## 2022-11-04 ENCOUNTER — Inpatient Hospital Stay (HOSPITAL_BASED_OUTPATIENT_CLINIC_OR_DEPARTMENT_OTHER): Payer: Medicare PPO | Admitting: Internal Medicine

## 2022-11-04 VITALS — BP 127/61 | HR 76 | Temp 97.6°F | Resp 20 | Wt 140.2 lb

## 2022-11-04 VITALS — BP 114/60 | HR 66

## 2022-11-04 DIAGNOSIS — K766 Portal hypertension: Secondary | ICD-10-CM | POA: Insufficient documentation

## 2022-11-04 DIAGNOSIS — D61818 Other pancytopenia: Secondary | ICD-10-CM

## 2022-11-04 DIAGNOSIS — I4891 Unspecified atrial fibrillation: Secondary | ICD-10-CM | POA: Diagnosis not present

## 2022-11-04 DIAGNOSIS — K7460 Unspecified cirrhosis of liver: Secondary | ICD-10-CM | POA: Diagnosis not present

## 2022-11-04 DIAGNOSIS — K7581 Nonalcoholic steatohepatitis (NASH): Secondary | ICD-10-CM | POA: Insufficient documentation

## 2022-11-04 DIAGNOSIS — Z86000 Personal history of in-situ neoplasm of breast: Secondary | ICD-10-CM | POA: Diagnosis not present

## 2022-11-04 DIAGNOSIS — Z803 Family history of malignant neoplasm of breast: Secondary | ICD-10-CM | POA: Diagnosis not present

## 2022-11-04 DIAGNOSIS — D500 Iron deficiency anemia secondary to blood loss (chronic): Secondary | ICD-10-CM | POA: Diagnosis present

## 2022-11-04 DIAGNOSIS — Z800 Family history of malignant neoplasm of digestive organs: Secondary | ICD-10-CM | POA: Diagnosis not present

## 2022-11-04 DIAGNOSIS — D6959 Other secondary thrombocytopenia: Secondary | ICD-10-CM | POA: Diagnosis not present

## 2022-11-04 DIAGNOSIS — E611 Iron deficiency: Secondary | ICD-10-CM

## 2022-11-04 DIAGNOSIS — R161 Splenomegaly, not elsewhere classified: Secondary | ICD-10-CM | POA: Diagnosis not present

## 2022-11-04 DIAGNOSIS — N920 Excessive and frequent menstruation with regular cycle: Secondary | ICD-10-CM | POA: Diagnosis present

## 2022-11-04 DIAGNOSIS — I495 Sick sinus syndrome: Secondary | ICD-10-CM | POA: Diagnosis not present

## 2022-11-04 LAB — CMP (CANCER CENTER ONLY)
ALT: 20 U/L (ref 0–44)
AST: 32 U/L (ref 15–41)
Albumin: 3.4 g/dL — ABNORMAL LOW (ref 3.5–5.0)
Alkaline Phosphatase: 47 U/L (ref 38–126)
Anion gap: 7 (ref 5–15)
BUN: 14 mg/dL (ref 8–23)
CO2: 28 mmol/L (ref 22–32)
Calcium: 9 mg/dL (ref 8.9–10.3)
Chloride: 104 mmol/L (ref 98–111)
Creatinine: 0.74 mg/dL (ref 0.44–1.00)
GFR, Estimated: >60 mL/min (ref >60)
Glucose, Bld: 188 mg/dL — ABNORMAL HIGH (ref 70–99)
Potassium: 4 mmol/L (ref 3.5–5.1)
Sodium: 139 mmol/L (ref 135–145)
Total Bilirubin: 0.7 mg/dL (ref 0.3–1.2)
Total Protein: 7.7 g/dL (ref 6.5–8.1)

## 2022-11-04 LAB — CBC WITH DIFFERENTIAL (CANCER CENTER ONLY)
Abs Immature Granulocytes: 0 10*3/uL (ref 0.00–0.07)
Basophils Absolute: 0 10*3/uL (ref 0.0–0.1)
Basophils Relative: 1 %
Eosinophils Absolute: 0.1 10*3/uL (ref 0.0–0.5)
Eosinophils Relative: 5 %
HCT: 30.9 % — ABNORMAL LOW (ref 36.0–46.0)
Hemoglobin: 9.8 g/dL — ABNORMAL LOW (ref 12.0–15.0)
Immature Granulocytes: 0 %
Lymphocytes Relative: 35 %
Lymphs Abs: 0.5 10*3/uL — ABNORMAL LOW (ref 0.7–4.0)
MCH: 30.3 pg (ref 26.0–34.0)
MCHC: 31.7 g/dL (ref 30.0–36.0)
MCV: 95.7 fL (ref 80.0–100.0)
Monocytes Absolute: 0.2 10*3/uL (ref 0.1–1.0)
Monocytes Relative: 11 %
Neutro Abs: 0.8 10*3/uL — ABNORMAL LOW (ref 1.7–7.7)
Neutrophils Relative %: 48 %
Platelet Count: 54 10*3/uL — ABNORMAL LOW (ref 150–400)
RBC: 3.23 MIL/uL — ABNORMAL LOW (ref 3.87–5.11)
RDW: 14.7 % (ref 11.5–15.5)
WBC Count: 1.5 10*3/uL — ABNORMAL LOW (ref 4.0–10.5)
nRBC: 0 % (ref 0.0–0.2)

## 2022-11-04 LAB — IRON AND TIBC
Iron: 77 ug/dL (ref 28–170)
Saturation Ratios: 21 % (ref 10.4–31.8)
TIBC: 367 ug/dL (ref 250–450)
UIBC: 290 ug/dL

## 2022-11-04 LAB — FERRITIN: Ferritin: 145 ng/mL (ref 11–307)

## 2022-11-04 MED ORDER — SODIUM CHLORIDE 0.9% FLUSH
10.0000 mL | Freq: Once | INTRAVENOUS | Status: AC | PRN
  Administered 2022-11-04: 10 mL
  Filled 2022-11-04: qty 10

## 2022-11-04 MED ORDER — SODIUM CHLORIDE 0.9 % IV SOLN
Freq: Once | INTRAVENOUS | Status: AC
  Filled 2022-11-04: qty 250

## 2022-11-04 MED ORDER — SODIUM CHLORIDE 0.9 % IV SOLN
200.0000 mg | Freq: Once | INTRAVENOUS | Status: AC
  Administered 2022-11-04: 200 mg via INTRAVENOUS
  Filled 2022-11-04: qty 200

## 2022-11-04 NOTE — Assessment & Plan Note (Addendum)
#   Anemia-secondary GI bleed chronic/liver disease.  Hemoglobin-10-11.  Iron studies/ferritin -pending.  Overall stable Proceed with IV iron infusion. 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  ANC-800;  Platelets 50-60s stable.    # Tachy-brady syndrome- Hx of A.fib [Dr.End; no wtachnam device]- no anticoagulation.  stable.    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 BIL Mammogram JAN 2023 [Dr.Cintron]-within normal limits.  Not on AI.  Stable.   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   # DISPOSITION:  # proceed with Venofer today # follow up in 4 month- MD; labs- cbc/cmp;iron studies;ferritin; AFP-  possible Venofer-Dr.B

## 2022-11-04 NOTE — Telephone Encounter (Signed)
Name: Annette Foster  DOB: 16-Jun-1940  MRN: [id number]  Primary Cardiologist: Yvonne Kendall, MD  Chart reviewed as part of pre-operative protocol coverage. Because of [name]'s past medical history and time since last visit, she will require a follow-up in-office visit in order to better assess preoperative cardiovascular risk.  Patient has an office visit scheduled on 11/15/2022 with Cadence Furth, PA-C. Appointment notes have been updated to reflect need for pre-op evaluation.   Pre-op covering staff:  - Please contact requesting surgeon's office via preferred method (i.e, phone, fax) to inform them of need for appointment prior to surgery.  Carlos Levering, NP  11/04/2022, 12:58 PM

## 2022-11-04 NOTE — Patient Instructions (Signed)
Iron Sucrose Injection What is this medication? IRON SUCROSE (EYE ern SOO krose) treats low levels of iron (iron deficiency anemia) in people with kidney disease. Iron is a mineral that plays an important role in making red blood cells, which carry oxygen from your lungs to the rest of your body. This medicine may be used for other purposes; ask your health care provider or pharmacist if you have questions. COMMON BRAND NAME(S): Venofer What should I tell my care team before I take this medication? They need to know if you have any of these conditions: Anemia not caused by low iron levels Heart disease High levels of iron in the blood Kidney disease Liver disease An unusual or allergic reaction to iron, other medications, foods, dyes, or preservatives Pregnant or trying to get pregnant Breastfeeding How should I use this medication? This medication is for infusion into a vein. It is given in a hospital or clinic setting. Talk to your care team about the use of this medication in children. While this medication may be prescribed for children as young as 2 years for selected conditions, precautions do apply. Overdosage: If you think you have taken too much of this medicine contact a poison control center or emergency room at once. NOTE: This medicine is only for you. Do not share this medicine with others. What if I miss a dose? Keep appointments for follow-up doses. It is important not to miss your dose. Call your care team if you are unable to keep an appointment. What may interact with this medication? Do not take this medication with any of the following: Deferoxamine Dimercaprol Other iron products This medication may also interact with the following: Chloramphenicol Deferasirox This list may not describe all possible interactions. Give your health care provider a list of all the medicines, herbs, non-prescription drugs, or dietary supplements you use. Also tell them if you smoke,  drink alcohol, or use illegal drugs. Some items may interact with your medicine. What should I watch for while using this medication? Visit your care team regularly. Tell your care team if your symptoms do not start to get better or if they get worse. You may need blood work done while you are taking this medication. You may need to follow a special diet. Talk to your care team. Foods that contain iron include: whole grains/cereals, dried fruits, beans, or peas, leafy green vegetables, and organ meats (liver, kidney). What side effects may I notice from receiving this medication? Side effects that you should report to your care team as soon as possible: Allergic reactions--skin rash, itching, hives, swelling of the face, lips, tongue, or throat Low blood pressure--dizziness, feeling faint or lightheaded, blurry vision Shortness of breath Side effects that usually do not require medical attention (report to your care team if they continue or are bothersome): Flushing Headache Joint pain Muscle pain Nausea Pain, redness, or irritation at injection site This list may not describe all possible side effects. Call your doctor for medical advice about side effects. You may report side effects to FDA at 1-800-FDA-1088. Where should I keep my medication? This medication is given in a hospital or clinic. It will not be stored at home. NOTE: This sheet is a summary. It may not cover all possible information. If you have questions about this medicine, talk to your doctor, pharmacist, or health care provider.  2024 Elsevier/Gold Standard (2022-07-05 00:00:00)

## 2022-11-04 NOTE — Progress Notes (Signed)
Patient states her hands and feet are exteremly cold now.

## 2022-11-04 NOTE — Progress Notes (Signed)
Patient tolerated Venofer infusion well. Explained recommendation of 30 min post monitoring. Patient refused to wait post monitoring. Educated on what signs to watch for & to call with any concerns. No questions, discharged. Stable

## 2022-11-04 NOTE — Progress Notes (Signed)
[facility] OFFICE PROGRESS NOTE  Patient Care Team: Jaclyn Shaggy, MD as PCP - General (Internal Medicine) End, Cristal Deer, MD as PCP - Cardiology (Cardiology) Lanier Prude, MD as PCP - Electrophysiology (Cardiology) Earna Coder, MD as Consulting Physician (Internal Medicine)   Cancer Staging  No matching staging information was found for the patient.    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 OCT 2018- BMBx- MILD dyspoiesis; variable cellularity [10-50%]; FISH/Cytogenetics-NORMAL; F-One-NGS-declined by insurance.    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   #Frequent UTIs [2019-2020; Dr. Brandon/uro-gyne,[facility]]; Dr.Wohl/  # HRT [clinical trial thru [facility]; stopped July 2017 ]; CPAP   DIAGNOSIS: Left breast DCIS  STAGE:    0     ;GOALS: cure  CURRENT/MOST RECENT THERAPY: surveillaince    Ductal carcinoma in situ (DCIS) of left breast    INTERVAL HISTORY: Alone.  Ambulating independently.  Annette Foster 82 y.o.  female pleasant patient above history of DCIS; cirrhosis portal hypertension; A. fib and pancytopenia is here for follow-up.  Patient recently on hospital for upper GIB; s/p EGD [Dr.Wohl].   Patient states her hands and feet are exteremly cold now. No bleeding. Chronic mild fatigue.   Denies any swelling in the legs.  Denies any shortness of breath or cough.    Review of Systems  Constitutional:  Positive for malaise/fatigue. Negative for chills,  diaphoresis, fever and weight loss.  HENT:  Negative for nosebleeds and sore throat.   Eyes:  Negative for double vision.  Respiratory:  Negative for cough, hemoptysis, sputum production, shortness of breath and wheezing.   Cardiovascular:  Negative for chest pain, palpitations, orthopnea and leg swelling.  Gastrointestinal:  Negative for abdominal pain, blood in stool, constipation, diarrhea, heartburn, melena, nausea and vomiting.  Musculoskeletal:  Positive for back pain and joint pain.  Skin: Negative.  Negative for itching and rash.  Neurological:  Negative for dizziness, tingling, focal weakness, weakness and headaches.  Endo/Heme/Allergies:  Does not bruise/bleed easily.  Psychiatric/Behavioral:  Negative for depression. The patient is not nervous/anxious and does not have insomnia.       PAST MEDICAL HISTORY :  Past Medical History:  Diagnosis Date   Abdominal pain    Allergy    Cancer (HCC) 2017   breast cancer- Left   Cataract    CHF (congestive heart failure) (HCC)    Collagenous colitis    Coronary artery disease    Diabetes mellitus without complication (HCC)    Diarrhea    Diverticulosis    Dysrhythmia    Fatty liver disease, nonalcoholic    Fibrocystic breast    GERD (gastroesophageal reflux disease)    Heart murmur    Hyperlipidemia    Hypertension    Hypothyroidism    IBS (irritable bowel syndrome)    IDA (iron deficiency anemia)    Liver cirrhosis (HCC)    Personal history of radiation therapy 2017   LEFT BREAST CA   PONV (postoperative nausea and vomiting)    Presence of permanent cardiac pacemaker    Sleep apnea  C-Pap    PAST SURGICAL HISTORY :   Past Surgical History:  Procedure Laterality Date   ABDOMINAL HYSTERECTOMY     [place] bso   ABDOMINAL SURGERY     APPENDECTOMY     AV NODE ABLATION N/A 05/17/2021   Procedure: AV NODE ABLATION;  Surgeon: Lanier Prude, MD;  Location: [facility];  Service: Cardiovascular;  Laterality:  N/A;   BREAST BIOPSY Bilateral 2016   negative   BREAST 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/2015   DCIS, papillary carcinoma in situ   BREAST BIOPSY Left 05/27/2016   BENIGN MAMMARY EPITHELIUM   BREAST BIOPSY Left 11/20/2017   affirm bx x clip BENIGN MAMMARY EPITHELIUM CONSISTENT WITH RAD THERAPY   BREAST EXCISIONAL BIOPSY Right    NEG 1980's   BREAST LUMPECTOMY Left 10/17/2015   DCIS and papillary carcinoma insitu, clear margins   CARDIAC SURGERY     has replacement valve   CATARACT EXTRACTION Right    CATARACT EXTRACTION W/PHACO Left 01/16/2021   Procedure: CATARACT EXTRACTION PHACO AND INTRAOCULAR LENS PLACEMENT (IOC) LEFT DIABETIC 8.46 00:59.8;  Surgeon: Galen Manila, MD;  Location: [facility];  Service: Ophthalmology;  Laterality: Left;   CHOLECYSTECTOMY     COLONOSCOPY WITH PROPOFOL N/A 03/11/2016   Procedure: COLONOSCOPY WITH PROPOFOL;  Surgeon: Scot Jun, MD;  Location: [facility] ENDOSCOPY;  Service: Endoscopy;  Laterality: N/A;   COLONOSCOPY WITH PROPOFOL N/A 02/18/2020   Procedure: COLONOSCOPY WITH PROPOFOL;  Surgeon: Midge Minium, MD;  Location[facility] ENDOSCOPY;  Service: Endoscopy;  Laterality: N/A;   COLONOSCOPY WITH PROPOFOL N/A 09/10/2022   Procedure: COLONOSCOPY WITH PROPOFOL;  Surgeon: Wyline Mood, MD;  Location: [facility] ENDOSCOPY;  Service: Gastroenterology;  Laterality: N/A;   ESOPHAGEAL BANDING  09/10/2022   Procedure: ESOPHAGEAL BANDING;  Surgeon: Wyline Mood, MD;  Location: [facility] ENDOSCOPY;  Service: Gastroenterology;;   ESOPHAGEAL BANDING  10/17/2022   Procedure: ESOPHAGEAL BANDING;  Surgeon: Midge Minium, MD;  Location: [facility] ENDOSCOPY;  Service: Endoscopy;;   ESOPHAGOGASTRODUODENOSCOPY (EGD) WITH PROPOFOL N/A 03/11/2016   Procedure: ESOPHAGOGASTRODUODENOSCOPY (EGD) WITH PROPOFOL;  Surgeon: Scot Jun, MD;  Location: [facility] ENDOSCOPY;  Service: Endoscopy;  Laterality: N/A;   ESOPHAGOGASTRODUODENOSCOPY (EGD) WITH PROPOFOL N/A 02/18/2020   Procedure:  ESOPHAGOGASTRODUODENOSCOPY (EGD) WITH PROPOFOL;  Surgeon: Midge Minium, MD;  Location: [facility] ENDOSCOPY;  Service: Endoscopy;  Laterality: N/A;   ESOPHAGOGASTRODUODENOSCOPY (EGD) WITH PROPOFOL N/A 04/25/2020   Procedure: ESOPHAGOGASTRODUODENOSCOPY (EGD) WITH PROPOFOL;  Surgeon: Midge Minium, MD;  Location: [facility] ENDOSCOPY;  Service: Endoscopy;  Laterality: N/A;   ESOPHAGOGASTRODUODENOSCOPY (EGD) WITH PROPOFOL N/A 05/23/2020   Procedure: ESOPHAGOGASTRODUODENOSCOPY (EGD) WITH PROPOFOL;  Surgeon: Midge Minium, MD;  Location: [facility] ENDOSCOPY;  Service: Endoscopy;  Laterality: N/A;   ESOPHAGOGASTRODUODENOSCOPY (EGD) WITH PROPOFOL N/A 09/10/2022   Procedure: ESOPHAGOGASTRODUODENOSCOPY (EGD) WITH PROPOFOL;  Surgeon: Wyline Mood, MD;  Location: [facility] ENDOSCOPY;  Service: Gastroenterology;  Laterality: N/A;   ESOPHAGOGASTRODUODENOSCOPY (EGD) WITH PROPOFOL N/A 10/17/2022   Procedure: ESOPHAGOGASTRODUODENOSCOPY (EGD) WITH PROPOFOL;  Surgeon: Midge Minium, MD;  Location: [facility] ENDOSCOPY;  Service: Endoscopy;  Laterality: N/A;   EUS N/A 10/04/2021   Procedure: LOWER ENDOSCOPIC ULTRASOUND (EUS);  Surgeon: Doren Custard, MD;  Location: [facility] ENDOSCOPY;  Service: Gastroenterology;  Laterality: N/A;  [facility]   EYE SURGERY     FINGER ARTHROSCOPY WITH CARPOMETACARPEL (CMC) ARTHROPLASTY Right 09/03/2018   Procedure: CARPOMETACARPEL [facility]) ARTHROPLASTY RIGHT THUMB;  Surgeon: Kennedy Bucker, MD;  Location: [facility] ORS;  Service: Orthopedics;  Laterality: Right;   FLEXIBLE SIGMOIDOSCOPY N/A 09/14/2021   Procedure:  FLEXIBLE SIGMOIDOSCOPY;  Surgeon: Midge Minium, MD;  Location: [facility] ENDOSCOPY;  Service: Endoscopy;  Laterality: N/A;  No anesthesia   GANGLION CYST EXCISION Right 09/03/2018   Procedure: REMOVAL GANGLION OF WRIST;  Surgeon: Kennedy Bucker, MD;  Location: [facility] ORS;  Service: Orthopedics;  Laterality: Right;   HARDWARE REMOVAL Right 09/03/2018   Procedure: HARDWARE REMOVAL RIGHT THUMB;  Surgeon: Kennedy Bucker, MD;   Location: [facility] ORS;  Service: Orthopedics;  Laterality: Right;  staple removed   HEMOSTASIS CLIP PLACEMENT  09/10/2022   Procedure: HEMOSTASIS CLIP PLACEMENT;  Surgeon: Wyline Mood, MD;  Location: [facility] ENDOSCOPY;  Service: Gastroenterology;;   HEMOSTASIS CONTROL  09/10/2022   Procedure: HEMOSTASIS CONTROL;  Surgeon: Wyline Mood, MD;  Location: [facility] ENDOSCOPY;  Service: Gastroenterology;;   JOINT REPLACEMENT Left    TKR   left sinusplasty      MASTECTOMY, PARTIAL Left 10/17/2015   Procedure: MASTECTOMY PARTIAL REVISION;  Surgeon: Nadeen Landau, MD;  Location: [facility] ORS;  Service: General;  Laterality: Left;   PACEMAKER IMPLANT N/A 03/23/2021   Procedure: PACEMAKER IMPLANT;  Surgeon: Lanier Prude, MD;  Location: [facility];  Service: Cardiovascular;  Laterality: N/A;   PARTIAL MASTECTOMY WITH NEEDLE LOCALIZATION Left 09/29/2015   Procedure: PARTIAL MASTECTOMY WITH NEEDLE LOCALIZATION;  Surgeon: Nadeen Landau, MD;  Location: [facility] ORS;  Service: General;  Laterality: Left;   POLYPECTOMY  09/10/2022   Procedure: POLYPECTOMY;  Surgeon: Wyline Mood, MD;  Location: [facility] ENDOSCOPY;  Service: Gastroenterology;;   RECTAL EXAM UNDER ANESTHESIA N/A 10/05/2021   Procedure: RECTAL EXAM UNDER ANESTHESIA, ASPIRATION OF RECTAL CYST;  Surgeon: Carolan Shiver, MD;  Location: [facility] ORS;  Service: General;  Laterality: N/A;   TOTAL ABDOMINAL HYSTERECTOMY W/ BILATERAL SALPINGOOPHORECTOMY      FAMILY HISTORY :   Family History  Problem Relation Age of Onset   Breast cancer Paternal Grandmother    Colon cancer Father    Diabetes Sister    Diabetes Brother    Heart disease Brother    Prostate cancer Brother    Colon cancer Maternal Uncle    Prostate cancer Brother    Bladder Cancer Brother    Leukemia Mother        all   Ovarian cancer Neg Hx    Kidney cancer Neg Hx     SOCIAL HISTORY:   Social History   Tobacco Use   Smoking status: Never   Smokeless tobacco: Never   Vaping Use   Vaping status: Never Used  Substance Use Topics   Alcohol use: No    Alcohol/week: 0.0 standard drinks of alcohol   Drug use: No    ALLERGIES:  is allergic to codeine, demeclocycline, demerol [meperidine], hydrocodone, other, oxycodone, pentazocine, tetracyclines & related, coal tar extract, hydrocodone-acetaminophen, salicylic acid, tetracycline hcl, and fentanyl.  MEDICATIONS:  Current Outpatient Medications  Medication Sig Dispense Refill   Azelastine HCl 137 MCG/SPRAY SOLN INHALE 1-2 PUFFS IN EACH NOSTRIL NASALLY ONCE A DAY 30 DAY(S)     Cholecalciferol (VITAMIN D) 50 MCG (2000 UT) CAPS Take 2,000 Units by mouth daily.     clobetasol ointment (TEMOVATE) 0.05 % Apply 1 application topically as needed (vaginal irritation).     clotrimazole (LOTRIMIN) 1 % cream Apply 1 Application topically 2 (two) times daily as needed.     cyanocobalamin (,VITAMIN B-12,) 1000 MCG/ML injection 1,000 mcg every 30 (thirty) days.     dicyclomine (BENTYL) 10 MG capsule Take 10 mg by mouth in the morning, at noon,  and at bedtime.     docusate sodium (COLACE) 100 MG capsule Take 100 mg by mouth 3 (three) times daily as needed for moderate constipation.     EPINEPHrine 0.3 mg/0.3 mL IJ SOAJ injection Inject 0.3 mg into the muscle as needed for anaphylaxis.     esomeprazole (NEXIUM) 40 MG capsule Take 40 mg by mouth 2 (two) times daily before a meal.      estradiol (ESTRACE) 0.1 MG/GM vaginal cream Place 1 Applicatorful vaginally daily as needed (irritation).     fenofibrate 160 MG tablet Take 160 mg by mouth at bedtime.     gabapentin (NEURONTIN) 300 MG capsule Take 300 mg by mouth at bedtime.      hydrOXYzine (ATARAX/VISTARIL) 25 MG tablet Take 25 mg by mouth every 6 (six) hours as needed for anxiety, itching, nausea or vomiting.     levocetirizine (XYZAL) 5 MG tablet Take 5 mg by mouth every evening.     levothyroxine (SYNTHROID, LEVOTHROID) 75 MCG tablet Take 75 mcg by mouth daily before  breakfast.      magnesium oxide (MAG-OX) 400 MG tablet Take 400 mg by mouth 2 (two) times daily.      metoprolol tartrate (LOPRESSOR) 50 MG tablet TAKE 1 TABLET 2 HR PRIOR TO CARDIAC PROCEDURE 1 tablet 0   nadolol (CORGARD) 20 MG tablet Take 1 tablet (20 mg total) by mouth daily. 90 tablet 3   NONFORMULARY OR COMPOUNDED ITEM Nifedipine 0.3% plus lidocaine 2% cream apply to rectum BID and after every BM.     NOVOLOG FLEXPEN 100 UNIT/ML FlexPen Inject 48-68 Units into the skin 3 (three) times daily with meals. Based on sliding scale     potassium chloride SA (K-DUR,KLOR-CON) 20 MEQ tablet Take 20 mEq by mouth 2 (two) times daily.      saccharomyces boulardii (FLORASTOR) 250 MG capsule Take 250 mg by mouth 2 (two) times daily.      simvastatin (ZOCOR) 20 MG tablet Take 20 mg by mouth at bedtime.     spironolactone (ALDACTONE) 25 MG tablet TAKE 1/2 TABLET BY MOUTH DAILY 45 tablet 3   tirzepatide (MOUNJARO) 10 MG/0.5ML Pen Inject 10 mg into the skin once a week. 6 mL 1   torsemide (DEMADEX) 20 MG tablet TAKE 1 TABLET BY MOUTH EVERY DAY 90 tablet 0   TRESIBA FLEXTOUCH 200 UNIT/ML FlexTouch Pen 194 Unit(s) SUB-Q Daily     cyclobenzaprine (FLEXERIL) 5 MG tablet Take 1 tablet by mouth 2 (two) times daily. (Patient not taking: Reported on 10/17/2022)     MOUNJARO 7.5 MG/0.5ML Pen Inject 12.5 mg into the skin once a week. (Patient not taking: Reported on 10/03/2022)     valACYclovir (VALTREX) 500 MG tablet Take 500 mg by mouth 2 (two) times daily as needed (fever blisters). (Patient not taking: Reported on 10/17/2022)  1   No current facility-administered medications for this visit.   Facility-Administered Medications Ordered in Other Visits  Medication Dose Route Frequency Provider Last Rate Last Admin   iron sucrose (VENOFER) 200 mg in sodium chloride 0.9 % 100 mL IVPB  200 mg Intravenous Once Earna Coder, MD        PHYSICAL EXAMINATION: ECOG PERFORMANCE STATUS: 1 - Symptomatic but completely  ambulatory  BP 127/61   Pulse 76   Temp 97.6 F (36.4 C)   Resp 20   Wt 140 lb 3.2 oz (63.6 kg)   LMP  (LMP Unknown)   SpO2 100%   BMI 24.84 kg/m  Filed Weights   11/04/22 0947  Weight: 140 lb 3.2 oz (63.6 kg)    Physical Exam Constitutional:      Comments: Alone.  Ambulating independently.  HENT:     Head: Normocephalic and atraumatic.     Mouth/Throat:     Pharynx: No oropharyngeal exudate.  Eyes:     Pupils: Pupils are equal, round, and reactive to light.  Cardiovascular:     Rate and Rhythm: Tachycardia present. Rhythm irregular.  Pulmonary:     Effort: Pulmonary effort is normal. No respiratory distress.     Breath sounds: Normal breath sounds. No wheezing.  Abdominal:     General: Bowel sounds are normal. There is no distension.     Palpations: Abdomen is soft. There is no mass.     Tenderness: There is no abdominal tenderness. There is no guarding or rebound.     Comments: Positive for splenomegaly.  Musculoskeletal:        General: No tenderness. Normal range of motion.     Cervical back: Normal range of motion and neck supple.  Skin:    General: Skin is warm.  Neurological:     Mental Status: She is alert and oriented to person, place, and time.  Psychiatric:        Mood and Affect: Affect normal.      LABORATORY DATA:  I have reviewed the data as listed    Component Value Date/Time   NA 139 11/04/2022 0934   NA 138 10/03/2022 1509   K 4.0 11/04/2022 0934   CL 104 11/04/2022 0934   CO2 28 11/04/2022 0934   GLUCOSE 188 (H) 11/04/2022 0934   BUN 14 11/04/2022 0934   BUN 15 10/03/2022 1509   CREATININE 0.74 11/04/2022 0934   CALCIUM 9.0 11/04/2022 0934   PROT 7.7 11/04/2022 0934   PROT 7.1 11/30/2020 0944   ALBUMIN 3.4 (L) 11/04/2022 0934   ALBUMIN 3.8 11/30/2020 0944   AST 32 11/04/2022 0934   ALT 20 11/04/2022 0934   ALKPHOS 47 11/04/2022 0934   BILITOT 0.7 11/04/2022 0934   GFRNONAA >60 11/04/2022 0934   GFRAA 80 02/25/2020 1105     No results found for: "SPEP", "UPEP"  Lab Results  Component Value Date   WBC 1.5 (L) 11/04/2022   NEUTROABS 0.8 (L) 11/04/2022   HGB 9.8 (L) 11/04/2022   HCT 30.9 (L) 11/04/2022   MCV 95.7 11/04/2022   PLT 54 (L) 11/04/2022      Chemistry      Component Value Date/Time   NA 139 11/04/2022 0934   NA 138 10/03/2022 1509   K 4.0 11/04/2022 0934   CL 104 11/04/2022 0934   CO2 28 11/04/2022 0934   BUN 14 11/04/2022 0934   BUN 15 10/03/2022 1509   CREATININE 0.74 11/04/2022 0934      Component Value Date/Time   CALCIUM 9.0 11/04/2022 0934   ALKPHOS 47 11/04/2022 0934   AST 32 11/04/2022 0934   ALT 20 11/04/2022 0934   BILITOT 0.7 11/04/2022 0934       RADIOGRAPHIC STUDIES: I have personally reviewed the radiological images as listed and agreed with the findings in the report. No results found.   ASSESSMENT & PLAN:  Other pancytopenia (HCC) # Anemia-secondary GI bleed chronic/liver disease.  Hemoglobin-10-11.  Iron studies/ferritin -pending.  Overall stable Proceed with IV iron infusion. 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  ANC-800;  Platelets 50-60s stable.    #  Tachy-brady syndrome- Hx of A.fib [Dr.End; no wtachnam device]- no anticoagulation.  stable.    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 BIL Mammogram JAN 2023 [Dr.Cintron]-within normal limits.  Not on AI.  Stable.   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   # DISPOSITION:  # proceed with Venofer today # follow up in 4 month- MD; labs- cbc/cmp;iron studies;ferritin; AFP-  possible Venofer-Dr.B    Orders Placed This Encounter  Procedures   CBC with Differential (Cancer Center Only)    Standing Status:   Future    Standing  Expiration Date:   11/04/2023   CMP (Cancer Center only)    Standing Status:   Future    Standing Expiration Date:   11/04/2023   Iron and TIBC([facility])    Standing Status:   Future    Standing Expiration Date:   11/04/2023   Ferritin    Standing Status:   Future    Standing Expiration Date:   11/04/2023   AFP tumor marker    Standing Status:   Future    Standing Expiration Date:   11/04/2023   All questions were answered. The patient knows to call the clinic with any problems, questions or concerns.      Earna Coder, MD 11/04/2022 10:53 AM

## 2022-11-05 LAB — AFP TUMOR MARKER: AFP, Serum, Tumor Marker: 3.8 ng/mL (ref 0.0–8.7)

## 2022-11-12 NOTE — Progress Notes (Unsigned)
Cardiology Office Note Date:  11/13/2022  Patient ID:  Annette, Foster 01-30-1941, MRN [id number] PCP:  Jaclyn Shaggy, MD  Cardiologist:  Yvonne Kendall, MD Electrophysiologist: Lanier Prude, MD    Chief Complaint: PPM follow-up  History of Present Illness: Annette Foster is a 82 y.o. female with PMH notable for parox AFib, Tachy-brady syndrome, s/p PPM, s/p AVJ ablation, HTN, OSA, hypothyroid ; seen today for Lanier Prude, MD for routine electrophysiology followup.  She last saw Dr. Lalla Brothers 06/2021. Not on OAC given cirrhosis c/b esophageal varices. Not watchman candidate d/t unfavorable anatomy.  On follow-up today, she is upcoming EGD. She has had two recent EGDs for her esophageal varices and had 3 clips placed during each procedure. Despite this, she states that she overall feels well. She does not think she has had any AF episodes. Denies palpitations, chest pain, chest pressure. No edema, weight stable.    Device Information: Biotronik dual chamber PPM, imp 03/2021; SND   Past Medical History:  Diagnosis Date   Abdominal pain    Allergy    Cancer (HCC) 2017   breast cancer- Left   Cataract    CHF (congestive heart failure) (HCC)    Collagenous colitis    Coronary artery disease    Diabetes mellitus without complication (HCC)    Diarrhea    Diverticulosis    Dysrhythmia    Fatty liver disease, nonalcoholic    Fibrocystic breast    GERD (gastroesophageal reflux disease)    Heart murmur    Hyperlipidemia    Hypertension    Hypothyroidism    IBS (irritable bowel syndrome)    IDA (iron deficiency anemia)    Liver cirrhosis (HCC)    Personal history of radiation therapy 2017   LEFT BREAST CA   PONV (postoperative nausea and vomiting)    Presence of permanent cardiac pacemaker    Sleep apnea    C-Pap    Past Surgical History:  Procedure Laterality Date   ABDOMINAL HYSTERECTOMY     [place] bso   ABDOMINAL SURGERY     APPENDECTOMY     AV  NODE ABLATION N/A 05/17/2021   Procedure: AV NODE ABLATION;  Surgeon: Lanier Prude, MD;  Location: [facility];  Service: Cardiovascular;  Laterality: N/A;   BREAST BIOPSY Bilateral 2016   negative   BREAST 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/2015   DCIS, papillary carcinoma in situ   BREAST BIOPSY Left 05/27/2016   BENIGN MAMMARY EPITHELIUM   BREAST BIOPSY Left 11/20/2017   affirm bx x clip BENIGN MAMMARY EPITHELIUM CONSISTENT WITH RAD THERAPY   BREAST EXCISIONAL BIOPSY Right    NEG 1980's   BREAST LUMPECTOMY Left 10/17/2015   DCIS and papillary carcinoma insitu, clear margins   CARDIAC SURGERY     has replacement valve   CATARACT EXTRACTION Right    CATARACT EXTRACTION W/PHACO Left 01/16/2021   Procedure: CATARACT EXTRACTION PHACO AND INTRAOCULAR LENS PLACEMENT (IOC) LEFT DIABETIC 8.46 00:59.8;  Surgeon: Galen Manila, MD;  Location: [facility];  Service: Ophthalmology;  Laterality: Left;   CHOLECYSTECTOMY     COLONOSCOPY WITH PROPOFOL N/A 03/11/2016   Procedure: COLONOSCOPY WITH PROPOFOL;  Surgeon: Scot Jun, MD;  Location: [facility] ENDOSCOPY;  Service: Endoscopy;  Laterality: N/A;   COLONOSCOPY WITH PROPOFOL N/A 02/18/2020   Procedure: COLONOSCOPY WITH PROPOFOL;  Surgeon: Midge Minium, MD;  Location: [facility] ENDOSCOPY;  Service: Endoscopy;  Laterality: N/A;   COLONOSCOPY WITH PROPOFOL N/A 09/10/2022  Procedure: COLONOSCOPY WITH PROPOFOL;  Surgeon: Wyline Mood, MD;  Location: [facility] ENDOSCOPY;  Service: Gastroenterology;  Laterality: N/A;   ESOPHAGEAL BANDING  09/10/2022   Procedure: ESOPHAGEAL BANDING;  Surgeon: Wyline Mood, MD;  Location: [facility] ENDOSCOPY;  Service: Gastroenterology;;   ESOPHAGEAL BANDING  10/17/2022   Procedure: ESOPHAGEAL BANDING;  Surgeon: Midge Minium, MD;  Location: [facility] ENDOSCOPY;  Service: Endoscopy;;   ESOPHAGOGASTRODUODENOSCOPY (EGD) WITH PROPOFOL N/A 03/11/2016   Procedure: ESOPHAGOGASTRODUODENOSCOPY (EGD) WITH PROPOFOL;  Surgeon: Scot Jun, MD;   Location: [facility] ENDOSCOPY;  Service: Endoscopy;  Laterality: N/A;   ESOPHAGOGASTRODUODENOSCOPY (EGD) WITH PROPOFOL N/A 02/18/2020   Procedure: ESOPHAGOGASTRODUODENOSCOPY (EGD) WITH PROPOFOL;  Surgeon: Midge Minium, MD;  Location: [facility] ENDOSCOPY;  Service: Endoscopy;  Laterality: N/A;   ESOPHAGOGASTRODUODENOSCOPY (EGD) WITH PROPOFOL N/A 04/25/2020   Procedure: ESOPHAGOGASTRODUODENOSCOPY (EGD) WITH PROPOFOL;  Surgeon: Midge Minium, MD;  Location: [facility] ENDOSCOPY;  Service: Endoscopy;  Laterality: N/A;   ESOPHAGOGASTRODUODENOSCOPY (EGD) WITH PROPOFOL N/A 05/23/2020   Procedure: ESOPHAGOGASTRODUODENOSCOPY (EGD) WITH PROPOFOL;  Surgeon: Midge Minium, MD;  Location: [facility] ENDOSCOPY;  Service: Endoscopy;  Laterality: N/A;   ESOPHAGOGASTRODUODENOSCOPY (EGD) WITH PROPOFOL N/A 09/10/2022   Procedure: ESOPHAGOGASTRODUODENOSCOPY (EGD) WITH PROPOFOL;  Surgeon: Wyline Mood, MD;  Location: [facility] ENDOSCOPY;  Service: Gastroenterology;  Laterality: N/A;   ESOPHAGOGASTRODUODENOSCOPY (EGD) WITH PROPOFOL N/A 10/17/2022   Procedure: ESOPHAGOGASTRODUODENOSCOPY (EGD) WITH PROPOFOL;  Surgeon: Midge Minium, MD;  Location: [facility] ENDOSCOPY;  Service: Endoscopy;  Laterality: N/A;   EUS N/A 10/04/2021   Procedure: LOWER ENDOSCOPIC ULTRASOUND (EUS);  Surgeon: Doren Custard, MD;  Location: [facility] ENDOSCOPY;  Service: Gastroenterology;  Laterality: N/A;  [facility]   EYE SURGERY     FINGER ARTHROSCOPY WITH CARPOMETACARPEL (CMC) ARTHROPLASTY Right 09/03/2018   Procedure: CARPOMETACARPEL [facility]) ARTHROPLASTY RIGHT THUMB;  Surgeon: Kennedy Bucker, MD;  Location: [facility] ORS;  Service: Orthopedics;  Laterality: Right;   FLEXIBLE SIGMOIDOSCOPY N/A 09/14/2021   Procedure: FLEXIBLE SIGMOIDOSCOPY;  Surgeon: Midge Minium, MD;  Location: [facility] ENDOSCOPY;  Service: Endoscopy;  Laterality: N/A;  No anesthesia   GANGLION CYST EXCISION Right 09/03/2018   Procedure: REMOVAL GANGLION OF WRIST;  Surgeon: Kennedy Bucker, MD;  Location: [facility] ORS;  Service:  Orthopedics;  Laterality: Right;   HARDWARE REMOVAL Right 09/03/2018   Procedure: HARDWARE REMOVAL RIGHT THUMB;  Surgeon: Kennedy Bucker, MD;  Location: [facility] ORS;  Service: Orthopedics;  Laterality: Right;  staple removed   HEMOSTASIS CLIP PLACEMENT  09/10/2022   Procedure: HEMOSTASIS CLIP PLACEMENT;  Surgeon: Wyline Mood, MD;  Location: [facility] ENDOSCOPY;  Service: Gastroenterology;;   HEMOSTASIS CONTROL  09/10/2022   Procedure: HEMOSTASIS CONTROL;  Surgeon: Wyline Mood, MD;  Location: [facility] ENDOSCOPY;  Service: Gastroenterology;;   JOINT REPLACEMENT Left    TKR   left sinusplasty      MASTECTOMY, PARTIAL Left 10/17/2015   Procedure: MASTECTOMY PARTIAL REVISION;  Surgeon: Nadeen Landau, MD;  Location: [facility] ORS;  Service: General;  Laterality: Left;   PACEMAKER IMPLANT N/A 03/23/2021   Procedure: PACEMAKER IMPLANT;  Surgeon: Lanier Prude, MD;  Location: [facility];  Service: Cardiovascular;  Laterality: N/A;   PARTIAL MASTECTOMY WITH NEEDLE LOCALIZATION Left 09/29/2015   Procedure: PARTIAL MASTECTOMY WITH NEEDLE LOCALIZATION;  Surgeon: Nadeen Landau, MD;  Location: [facility] ORS;  Service: General;  Laterality: Left;   POLYPECTOMY  09/10/2022   Procedure: POLYPECTOMY;  Surgeon: Wyline Mood, MD;  Location: [facility] ENDOSCOPY;  Service: Gastroenterology;;   RECTAL EXAM UNDER ANESTHESIA N/A 10/05/2021   Procedure: RECTAL EXAM UNDER ANESTHESIA, ASPIRATION OF RECTAL  CYST;  Surgeon: Carolan Shiver, MD;  Location: [facility] ORS;  Service: General;  Laterality: N/A;   TOTAL ABDOMINAL HYSTERECTOMY W/ BILATERAL SALPINGOOPHORECTOMY      Current Outpatient Medications  Medication Instructions   Azelastine HCl 137 MCG/SPRAY SOLN INHALE 1-2 PUFFS IN EACH NOSTRIL NASALLY ONCE A DAY 30 DAY(S)   benzonatate (TESSALON) 100 mg, Oral, 3 times daily PRN   clobetasol ointment (TEMOVATE) 0.05 % 1 application , Topical, As needed   clotrimazole (LOTRIMIN) 1 % cream 1 Application, Topical, 2 times  daily PRN   cyanocobalamin (VITAMIN B12) 1,000 mcg, Every 30 days   cyclobenzaprine (FLEXERIL) 5 MG tablet 1 tablet, 2 times daily   dicyclomine (BENTYL) 10 mg, Oral, 3 times daily   docusate sodium (COLACE) 100 mg, Oral, 3 times daily PRN   EPINEPHrine (EPI-PEN) 0.3 mg, As needed   esomeprazole (NEXIUM) 40 mg, Oral, 2 times daily before meals   estradiol (ESTRACE) 0.1 MG/GM vaginal cream 1 Applicatorful, Vaginal, Daily PRN   fenofibrate 160 mg, Oral, Daily at bedtime   gabapentin (NEURONTIN) 300 mg, Oral, Daily at bedtime   hydrOXYzine (ATARAX) 25 mg, Oral, Every 6 hours PRN   levocetirizine (XYZAL) 5 mg, Oral, Every evening   levothyroxine (SYNTHROID) 75 mcg, Oral, Daily before breakfast   magnesium oxide (MAG-OX) 400 mg, Oral, 2 times daily   metoprolol tartrate (LOPRESSOR) 50 MG tablet TAKE 1 TABLET 2 HR PRIOR TO CARDIAC PROCEDURE   Mounjaro 12.5 mg, Subcutaneous, Weekly   Mounjaro 10 mg, Subcutaneous, Weekly   nadolol (CORGARD) 20 mg, Oral, Daily   NONFORMULARY OR COMPOUNDED ITEM Nifedipine 0.3% plus lidocaine 2% cream apply to rectum BID and after every BM.   NovoLOG FlexPen 48-68 Units, Subcutaneous, 3 times daily with meals, Based on sliding scale   potassium chloride SA (K-DUR,KLOR-CON) 20 MEQ tablet 20 mEq, Oral, 2 times daily   saccharomyces boulardii (FLORASTOR) 250 mg, Oral, 2 times daily   simvastatin (ZOCOR) 20 mg, Oral, Daily at bedtime   spironolactone (ALDACTONE) 12.5 mg, Oral, Daily   torsemide (DEMADEX) 20 MG tablet TAKE 1 TABLET BY MOUTH EVERY DAY   TRESIBA FLEXTOUCH 200 UNIT/ML FlexTouch Pen 194 Unit(s) SUB-Q Daily   valACYclovir (VALTREX) 500 mg, Oral, 2 times daily PRN   Vitamin D 2,000 Units, Oral, Daily    Social History:  The patient  reports that she has never smoked. She has never used smokeless tobacco. She reports that she does not drink alcohol and does not use drugs.   Family History:  The patient's family history includes Bladder Cancer in her  brother; Breast cancer in her paternal grandmother; Colon cancer in her father and maternal uncle; Diabetes in her brother and sister; Heart disease in her brother; Leukemia in her mother; Prostate cancer in her brother and brother.  ROS:  Please see the history of present illness. All other systems are reviewed and otherwise negative.   PHYSICAL EXAM:  VS:  BP (!) 110/52 (BP Location: Left Arm, Patient Position: Sitting, Cuff Size: Normal)   Pulse 60   Ht 5\' 3"  (1.6 m)   Wt 141 lb 4 oz (64.1 kg)   LMP  (LMP Unknown)   SpO2 96%   BMI 25.02 kg/m  BMI: Body mass index is 25.02 kg/m.  GEN- The patient is well appearing, alert and oriented x 3 today.   Lungs- Clear to ausculation bilaterally, normal work of breathing.  Heart- Regular rate and rhythm, murmur at LSB, No rubs or gallops Extremities- No  peripheral edema, warm, dry Skin-  device pocket well-healed, no tethering   Device interrogation done today and reviewed by myself:  Battery 7 years Lead thresholds, impedence, sensing stable  VP 97% One AF episodes on 9/4 for 1 hour No changes made today  EKG is not ordered. Personal review of EKG from  10/03/2022  shows:  AV dual paced, rate 68;  QRS        Recent Labs: 09/11/2022: TSH 0.466 11/04/2022: ALT 20; BUN 14; Creatinine 0.74; Hemoglobin 9.8; Platelet Count 54; Potassium 4.0; Sodium 139  No results found for requested labs within last 365 days.   Estimated Creatinine Clearance: 48.9 mL/min (by C-G formula based on SCr of 0.74 mg/dL).   Wt Readings from Last 3 Encounters:  11/13/22 141 lb 4 oz (64.1 kg)  11/04/22 140 lb 3.2 oz (63.6 kg)  10/17/22 142 lb (64.4 kg)     Additional studies reviewed include: Previous EP, cardiology notes.   TTE, 10/07/2022  1. No LVOT obstruction. Left ventricular ejection fraction, by estimation, is 55 to 60%. The left ventricle has normal function. The left ventricle has no regional wall motion abnormalities. There is severe  asymmetric left ventricular hypertrophy of the  septal segment. Left ventricular diastolic parameters are consistent with Grade II diastolic dysfunction (pseudonormalization).   2. Right ventricular systolic function is normal. The right ventricular size is normal.   3. Left atrial size was severely dilated.   4. The mitral valve has been repaired/replaced. Mild mitral valve regurgitation. The mean mitral valve gradient is 4.0 mmHg.   5. The aortic valve is tricuspid. Aortic valve regurgitation is not visualized. Aortic valve sclerosis/calcification is present, without any evidence of aortic stenosis.   TTE, 03/16/2021 1. Left ventricular ejection fraction, by estimation, is 55 to 60%. The left ventricle has normal function. The left ventricle has no regional wall motion abnormalities. There is moderate concentric left ventricular hypertrophy. Left ventricular diastolic parameters are indeterminate.   2. Right ventricular systolic function is normal. The right ventricular size is normal. Mildly increased right ventricular wall thickness. There is normal pulmonary artery systolic pressure. The estimated right ventricular systolic pressure is 24.7 mmHg.   3. Left atrial size was mildly dilated.   4. A small pericardial effusion is present.   5. The mitral valve is normal in structure. No evidence of mitral valve regurgitation. No evidence of mitral stenosis.   6. The aortic valve is normal in structure. Aortic valve regurgitation is not visualized. Aortic valve sclerosis is present, with no evidence of aortic valve stenosis.   7. The inferior vena cava is normal in size with greater than 50% respiratory variability, suggesting right atrial pressure of 3 mmHg.   ASSESSMENT AND PLAN:  #) Tachy-brady syndrome #) persis Afib #) s/p AVJ ablation #) s/p PPM  Rare AFib episodes. Patient states that she had EGD 9/4 which may be why she did not feel the episode. 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  Therefore, the patient's annual risk of stroke is 9.7 %. Not OAC candidate given h/o GI bleed with cirrhosis complicated by esophageal varices  Current medicines are reviewed at length with the patient today.   The patient does not have concerns regarding her medicines.  The following changes were made today:  none  Labs/ tests ordered today include:  No orders of the defined types were placed in this encounter.    Disposition: Follow up with Dr. Lalla Brothers or EP APP in 6 months   Signed, Sherie Don, NP  11/13/22  12:23 PM  Electrophysiology [facility]

## 2022-11-13 ENCOUNTER — Ambulatory Visit: Payer: Medicare PPO | Attending: Cardiology | Admitting: Cardiology

## 2022-11-13 ENCOUNTER — Encounter: Payer: Self-pay | Admitting: Cardiology

## 2022-11-13 VITALS — BP 110/52 | HR 60 | Ht 63.0 in | Wt 141.2 lb

## 2022-11-13 DIAGNOSIS — I495 Sick sinus syndrome: Secondary | ICD-10-CM

## 2022-11-13 DIAGNOSIS — Z9889 Other specified postprocedural states: Secondary | ICD-10-CM | POA: Diagnosis not present

## 2022-11-13 DIAGNOSIS — I480 Paroxysmal atrial fibrillation: Secondary | ICD-10-CM

## 2022-11-13 DIAGNOSIS — Z8719 Personal history of other diseases of the digestive system: Secondary | ICD-10-CM

## 2022-11-13 DIAGNOSIS — Z950 Presence of cardiac pacemaker: Secondary | ICD-10-CM | POA: Diagnosis not present

## 2022-11-13 DIAGNOSIS — I851 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 bleeding: Secondary | ICD-10-CM

## 2022-11-13 DIAGNOSIS — I517 Cardiomegaly: Secondary | ICD-10-CM

## 2022-11-13 LAB — CUP PACEART INCLINIC DEVICE CHECK
Date Time Interrogation Session: 20241002123654
Implantable Lead Connection Status: 753985
Implantable Lead Connection Status: 753985
Implantable Lead Implant Date: 20230210
Implantable Lead Implant Date: 20230210
Implantable Lead Location: 753859
Implantable Lead Location: 753860
Implantable Lead Model: 377169
Implantable Lead Model: 377171
Implantable Lead Serial Number: 7000400966
Implantable Lead Serial Number: 7000402075
Implantable Pulse Generator Implant Date: 20230210
Pulse Gen Model: 407145
Pulse Gen Serial Number: 70331369

## 2022-11-13 NOTE — Patient Instructions (Signed)
Medication Instructions:  The current medical regimen is effective;  continue present plan and medications as directed. Please refer to the Current Medication list given to you today.   *If you need a refill on your cardiac medications before your next appointment, please call your pharmacy*   Follow-Up: At [facility], you and your health needs are our priority.  As part of our continuing mission to provide you with exceptional heart care, we have created designated Provider Care Teams.  These Care Teams include your primary Cardiologist (physician) and Advanced Practice Providers (APPs -  Physician Assistants and Nurse Practitioners) who all work together to provide you with the care you need, when you need it.  We recommend signing up for the patient portal called "MyChart".  Sign up information is provided on this After Visit Summary.  MyChart is used to connect with patients for Virtual Visits (Telemedicine).  Patients are able to view lab/test results, encounter notes, upcoming appointments, etc.  Non-urgent messages can be sent to your provider as well.   To learn more about what you can do with MyChart, go to [email or web address].    Your next appointment:   6 month(s)  Provider:   Steffanie Dunn, MD or Sherie Don, NP

## 2022-11-13 NOTE — Progress Notes (Signed)
PERIOPERATIVE PRESCRIPTION FOR IMPLANTED CARDIAC DEVICE PROGRAMMING   Patient Information:  Patient: Annette Foster  MRN: [id number]  Date of Birth: 04-11-1940   Surgeon:  not indicated  Surgeon's Group or Practice Name:  [facility] Phone number:  [phone] Fax number:  [phone] Planned Procedure:  EGD  Date of Procedure:  TBD    Device Information:   Clinic EP Physician:   Dr. Steffanie Dunn Device Type:  Pacemaker Manufacturer and Phone #:  Biotronik: [phone] Pacemaker Dependent?:  Yes Date of Last Device Check:  11/13/2022        Normal Device Function?:  Yes     Electrophysiologist's Recommendations:   Have magnet available. Provide continuous ECG monitoring when magnet is used or reprogramming is to be performed.  Procedure will likely interfere with device function.  Device should be programmed:  Asynchronous pacing during procedure and returned to normal programming after procedure  Per Device Clinic Standing Orders, Lenor Coffin  11/13/2022 1:14 PM

## 2022-11-15 ENCOUNTER — Ambulatory Visit: Payer: Medicare PPO | Attending: Medical | Admitting: Medical

## 2022-11-15 ENCOUNTER — Encounter: Payer: Self-pay | Admitting: Medical

## 2022-11-15 VITALS — BP 102/54 | HR 76 | Ht 63.0 in | Wt 143.6 lb

## 2022-11-15 DIAGNOSIS — I495 Sick sinus syndrome: Secondary | ICD-10-CM | POA: Diagnosis not present

## 2022-11-15 DIAGNOSIS — Z8719 Personal history of other diseases of the digestive system: Secondary | ICD-10-CM | POA: Diagnosis not present

## 2022-11-15 DIAGNOSIS — I517 Cardiomegaly: Secondary | ICD-10-CM

## 2022-11-15 DIAGNOSIS — Z01818 Encounter for other preprocedural examination: Secondary | ICD-10-CM | POA: Diagnosis not present

## 2022-11-15 DIAGNOSIS — I480 Paroxysmal atrial fibrillation: Secondary | ICD-10-CM | POA: Diagnosis not present

## 2022-11-15 DIAGNOSIS — Z950 Presence of cardiac pacemaker: Secondary | ICD-10-CM

## 2022-11-15 NOTE — Progress Notes (Signed)
Cardiology Office Note:    Date:  11/15/2022   ID:  Annette Foster, DOB 14-Aug-1940, MRN [id number]  PCP:  Jaclyn Shaggy, MD  [facility] Cardiologist:  Yvonne Kendall, MD  [facility] Electrophysiologist:  Lanier Prude, MD   Referring MD: Jaclyn Shaggy, MD   Chief Complaint: 6 week follow-up  History of Present Illness:    Annette Foster is a 82 y.o. female with a hx of remote valve surgery in the 1970s, HFpEF, paroxysmal afib complicated by GIB on a/c, HTN, HLD, NASH, seizure disorder, OSA, left breast cancer s/p mastectomy who presents for follow-up.   Seen by EP for watchman, however left atrial appendage morphology precludes watchman placement. Anticoagulation was not pursued given h/o GIB with esophageal varices.    The patient was referred back to EP for AV junction ablation and  permanent pacemaker. When in afib patient very symptomatic with RVR, when in NSR she is brardycardic. She underwent device implant 03/23/21. Patient underwent AV junction ablation 8 weeks later.  She was not felt to be a good candidate for watchman due to inappropriate left atrial appendage anatomy.  The patient was admitted in July 2024 for Acute GI bleed.  She woke up noting spontaneous rectal bleeding.  She was admitted to the hospital and seen by GI.  She underwent EGD and colonoscopy.  EGD showed portal hypertension gastropathy, grade 3 esophageal varices.  Colonoscopy showed 2 polyps which were resected the descending colon and 1 polyp in ascending colon resected.  GI recommended IV octreotide drip for 72 hours and empiric antibiotics for 5 days.   Patient was last seen in August 2024 reporting she needed another EGD.  She reported episode of chest pain and shortness of breath so echo and cardiac CTA were ordered. Patient was unable to do cardiac CTA due to inability to lower HR with low BP. Echo showed LVEF 55-60%, G2Dd, severely dilated LA, mild MR with normally functioning valve.    Today, the patient days she is needing another EGD to put more clamps. She denies chest pain or SOB. She has minimal lower leg edema. She is scheduled for cMRI later this month. No lightheadedness or dizziness. She does not have a date for the next EGD.   Past Medical History:  Diagnosis Date   Abdominal pain    Allergy    Cancer (HCC) 2017   breast cancer- Left   Cataract    CHF (congestive heart failure) (HCC)    Collagenous colitis    Coronary artery disease    Diabetes mellitus without complication (HCC)    Diarrhea    Diverticulosis    Dysrhythmia    Fatty liver disease, nonalcoholic    Fibrocystic breast    GERD (gastroesophageal reflux disease)    Heart murmur    Hyperlipidemia    Hypertension    Hypothyroidism    IBS (irritable bowel syndrome)    IDA (iron deficiency anemia)    Liver cirrhosis (HCC)    Personal history of radiation therapy 2017   LEFT BREAST CA   PONV (postoperative nausea and vomiting)    Presence of permanent cardiac pacemaker    Sleep apnea    C-Pap    Past Surgical History:  Procedure Laterality Date   ABDOMINAL HYSTERECTOMY     tah bso   ABDOMINAL SURGERY     APPENDECTOMY     AV NODE ABLATION N/A 05/17/2021   Procedure: AV NODE ABLATION;  Surgeon: Steffanie Dunn  T, MD;  [facility];  Service: Cardiovascular;  Laterality: N/A;   BREAST BIOPSY Bilateral 2016   negative   BREAST 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/2015   DCIS, papillary carcinoma in situ   BREAST BIOPSY Left 05/27/2016   BENIGN MAMMARY EPITHELIUM   BREAST BIOPSY Left 11/20/2017   affirm bx x clip BENIGN MAMMARY EPITHELIUM CONSISTENT WITH RAD THERAPY   BREAST EXCISIONAL BIOPSY Right    NEG 1980's   BREAST LUMPECTOMY Left 10/17/2015   DCIS and papillary carcinoma insitu, clear margins   CARDIAC SURGERY     has replacement valve   CATARACT EXTRACTION Right    CATARACT EXTRACTION W/PHACO Left 01/16/2021   Procedure: CATARACT EXTRACTION PHACO AND INTRAOCULAR LENS  PLACEMENT (IOC) LEFT DIABETIC 8.46 00:59.8;  Surgeon: Galen Manila, MD;  Location: [facility];  Service: Ophthalmology;  Laterality: Left;   CHOLECYSTECTOMY     COLONOSCOPY WITH PROPOFOL N/A 03/11/2016   Procedure: COLONOSCOPY WITH PROPOFOL;  Surgeon: Scot Jun, MD;  Location: [facility] ENDOSCOPY;  Service: Endoscopy;  Laterality: N/A;   COLONOSCOPY WITH PROPOFOL N/A 02/18/2020   Procedure: COLONOSCOPY WITH PROPOFOL;  Surgeon: Midge Minium, MD;  Location: [facility] ENDOSCOPY;  Service: Endoscopy;  Laterality: N/A;   COLONOSCOPY WITH PROPOFOL N/A 09/10/2022   Procedure: COLONOSCOPY WITH PROPOFOL;  Surgeon: Wyline Mood, MD;  Location: [facility] ENDOSCOPY;  Service: Gastroenterology;  Laterality: N/A;   ESOPHAGEAL BANDING  09/10/2022   Procedure: ESOPHAGEAL BANDING;  Surgeon: Wyline Mood, MD;  Location: [facility] ENDOSCOPY;  Service: Gastroenterology;;   ESOPHAGEAL BANDING  10/17/2022   Procedure: ESOPHAGEAL BANDING;  Surgeon: Midge Minium, MD;  Location: [facility] ENDOSCOPY;  Service: Endoscopy;;   ESOPHAGOGASTRODUODENOSCOPY (EGD) WITH PROPOFOL N/A 03/11/2016   Procedure: ESOPHAGOGASTRODUODENOSCOPY (EGD) WITH PROPOFOL;  Surgeon: Scot Jun, MD;  Location: [facility] ENDOSCOPY;  Service: Endoscopy;  Laterality: N/A;   ESOPHAGOGASTRODUODENOSCOPY (EGD) WITH PROPOFOL N/A 02/18/2020   Procedure: ESOPHAGOGASTRODUODENOSCOPY (EGD) WITH PROPOFOL;  Surgeon: Midge Minium, MD;  Location: [facility] ENDOSCOPY;  Service: Endoscopy;  Laterality: N/A;   ESOPHAGOGASTRODUODENOSCOPY (EGD) WITH PROPOFOL N/A 04/25/2020   Procedure: ESOPHAGOGASTRODUODENOSCOPY (EGD) WITH PROPOFOL;  Surgeon: Midge Minium, MD;  Location: [facility] ENDOSCOPY;  Service: Endoscopy;  Laterality: N/A;   ESOPHAGOGASTRODUODENOSCOPY (EGD) WITH PROPOFOL N/A 05/23/2020   Procedure: ESOPHAGOGASTRODUODENOSCOPY (EGD) WITH PROPOFOL;  Surgeon: Midge Minium, MD;  Location: [facility] ENDOSCOPY;  Service: Endoscopy;  Laterality: N/A;   ESOPHAGOGASTRODUODENOSCOPY (EGD) WITH  PROPOFOL N/A 09/10/2022   Procedure: ESOPHAGOGASTRODUODENOSCOPY (EGD) WITH PROPOFOL;  Surgeon: Wyline Mood, MD;  Location: [facility] ENDOSCOPY;  Service: Gastroenterology;  Laterality: N/A;   ESOPHAGOGASTRODUODENOSCOPY (EGD) WITH PROPOFOL N/A 10/17/2022   Procedure: ESOPHAGOGASTRODUODENOSCOPY (EGD) WITH PROPOFOL;  Surgeon: Midge Minium, MD;  Location: [facility] ENDOSCOPY;  Service: Endoscopy;  Laterality: N/A;   EUS N/A 10/04/2021   Procedure: LOWER ENDOSCOPIC ULTRASOUND (EUS);  Surgeon: Doren Custard, MD;  Location: [facility] ENDOSCOPY;  Service: Gastroenterology;  Laterality: N/A;  [facility]   EYE SURGERY     FINGER ARTHROSCOPY WITH CARPOMETACARPEL (CMC) ARTHROPLASTY Right 09/03/2018   Procedure: CARPOMETACARPEL [facility]) ARTHROPLASTY RIGHT THUMB;  Surgeon: Kennedy Bucker, MD;  Location: [facility] ORS;  Service: Orthopedics;  Laterality: Right;   FLEXIBLE SIGMOIDOSCOPY N/A 09/14/2021   Procedure: FLEXIBLE SIGMOIDOSCOPY;  Surgeon: Midge Minium, MD;  Location: [facility] ENDOSCOPY;  Service: Endoscopy;  Laterality: N/A;  No anesthesia   GANGLION CYST EXCISION Right 09/03/2018   Procedure: REMOVAL GANGLION OF WRIST;  Surgeon: Kennedy Bucker, MD;  Location: [facility] ORS;  Service: Orthopedics;  Laterality: Right;   HARDWARE REMOVAL Right 09/03/2018  Procedure: HARDWARE REMOVAL RIGHT THUMB;  Surgeon: Kennedy Bucker, MD;  Location: [facility] ORS;  Service: Orthopedics;  Laterality: Right;  staple removed   HEMOSTASIS CLIP PLACEMENT  09/10/2022   Procedure: HEMOSTASIS CLIP PLACEMENT;  Surgeon: Wyline Mood, MD;  Location: [facility] ENDOSCOPY;  Service: Gastroenterology;;   HEMOSTASIS CONTROL  09/10/2022   Procedure: HEMOSTASIS CONTROL;  Surgeon: Wyline Mood, MD;  Location: [facility] ENDOSCOPY;  Service: Gastroenterology;;   JOINT REPLACEMENT Left    TKR   left sinusplasty      MASTECTOMY, PARTIAL Left 10/17/2015   Procedure: MASTECTOMY PARTIAL REVISION;  Surgeon: Nadeen Landau, MD;  Location: [facility] ORS;  Service: General;  Laterality: Left;    PACEMAKER IMPLANT N/A 03/23/2021   Procedure: PACEMAKER IMPLANT;  Surgeon: Lanier Prude, MD;  [facility];  Service: Cardiovascular;  Laterality: N/A;   PARTIAL MASTECTOMY WITH NEEDLE LOCALIZATION Left 09/29/2015   Procedure: PARTIAL MASTECTOMY WITH NEEDLE LOCALIZATION;  Surgeon: Nadeen Landau, MD;  Location: [facility] ORS;  Service: General;  Laterality: Left;   POLYPECTOMY  09/10/2022   Procedure: POLYPECTOMY;  Surgeon: Wyline Mood, MD;  Location: [facility] ENDOSCOPY;  Service: Gastroenterology;;   RECTAL EXAM UNDER ANESTHESIA N/A 10/05/2021   Procedure: RECTAL EXAM UNDER ANESTHESIA, ASPIRATION OF RECTAL CYST;  Surgeon: Carolan Shiver, MD;  Location: [facility] ORS;  Service: General;  Laterality: N/A;   TOTAL ABDOMINAL HYSTERECTOMY W/ BILATERAL SALPINGOOPHORECTOMY      Current Medications: Current Meds  Medication Sig   Azelastine HCl 137 MCG/SPRAY SOLN INHALE 1-2 PUFFS IN EACH NOSTRIL NASALLY ONCE A DAY 30 DAY(S)   benzonatate (TESSALON) 100 MG capsule Take 100 mg by mouth 3 (three) times daily as needed.   Cholecalciferol (VITAMIN D) 50 MCG (2000 UT) CAPS Take 2,000 Units by mouth daily.   clobetasol ointment (TEMOVATE) 0.05 % Apply 1 application topically as needed (vaginal irritation).   clotrimazole (LOTRIMIN) 1 % cream Apply 1 Application topically 2 (two) times daily as needed.   cyanocobalamin (,VITAMIN B-12,) 1000 MCG/ML injection 1,000 mcg every 30 (thirty) days.   dicyclomine (BENTYL) 10 MG capsule Take 10 mg by mouth in the morning, at noon, and at bedtime.   docusate sodium (COLACE) 100 MG capsule Take 100 mg by mouth 3 (three) times daily as needed for moderate constipation.   esomeprazole (NEXIUM) 40 MG capsule Take 40 mg by mouth 2 (two) times daily before a meal.    estradiol (ESTRACE) 0.1 MG/GM vaginal cream Place 1 Applicatorful vaginally daily as needed (irritation).   fenofibrate 160 MG tablet Take 160 mg by mouth at bedtime.   gabapentin  (NEURONTIN) 300 MG capsule Take 300 mg by mouth at bedtime.    hydrOXYzine (ATARAX/VISTARIL) 25 MG tablet Take 25 mg by mouth every 6 (six) hours as needed for anxiety, itching, nausea or vomiting.   levocetirizine (XYZAL) 5 MG tablet Take 5 mg by mouth every evening.   levothyroxine (SYNTHROID, LEVOTHROID) 75 MCG tablet Take 75 mcg by mouth daily before breakfast.    magnesium oxide (MAG-OX) 400 MG tablet Take 400 mg by mouth 2 (two) times daily.    metoprolol tartrate (LOPRESSOR) 50 MG tablet TAKE 1 TABLET 2 HR PRIOR TO CARDIAC PROCEDURE   MOUNJARO 7.5 MG/0.5ML Pen Inject 12.5 mg into the skin once a week.   nadolol (CORGARD) 20 MG tablet Take 1 tablet (20 mg total) by mouth daily.   NONFORMULARY OR COMPOUNDED ITEM Nifedipine 0.3% plus lidocaine 2% cream apply to rectum BID and after every BM.  NOVOLOG FLEXPEN 100 UNIT/ML FlexPen Inject 48-68 Units into the skin 3 (three) times daily with meals. Based on sliding scale   potassium chloride SA (K-DUR,KLOR-CON) 20 MEQ tablet Take 20 mEq by mouth 2 (two) times daily.    saccharomyces boulardii (FLORASTOR) 250 MG capsule Take 250 mg by mouth 2 (two) times daily.    simvastatin (ZOCOR) 20 MG tablet Take 20 mg by mouth at bedtime.   spironolactone (ALDACTONE) 25 MG tablet TAKE 1/2 TABLET BY MOUTH DAILY   tirzepatide (MOUNJARO) 10 MG/0.5ML Pen Inject 10 mg into the skin once a week.   torsemide (DEMADEX) 20 MG tablet TAKE 1 TABLET BY MOUTH EVERY DAY   TRESIBA FLEXTOUCH 200 UNIT/ML FlexTouch Pen 194 Unit(s) SUB-Q Daily   valACYclovir (VALTREX) 500 MG tablet Take 500 mg by mouth 2 (two) times daily as needed (fever blisters).     Allergies:   Codeine, Demeclocycline, Demerol [meperidine], Hydrocodone, Other, Oxycodone, Pentazocine, Tetracyclines & related, Coal tar extract, Hydrocodone-acetaminophen, Salicylic acid, Tetracycline hcl, and Fentanyl   Social History   Socioeconomic History   Marital status: Married    Spouse name: Chase Picket   Number of  children: Not on file   Years of education: Not on file   Highest education level: Not on file  Occupational History   Not on file  Tobacco Use   Smoking status: Never   Smokeless tobacco: Never  Vaping Use   Vaping status: Never Used  Substance and Sexual Activity   Alcohol use: No    Alcohol/week: 0.0 standard drinks of alcohol   Drug use: No   Sexual activity: Not Currently    Birth control/protection: Surgical  Other Topics Concern   Not on file  Social History Narrative    Husband lives at nursing home , lives with husbands sister   Social Determinants of Health   Financial Resource Strain: Not on file  Food Insecurity: No Food Insecurity (09/09/2022)   Hunger Vital Sign    Worried About Running Out of Food in the Last Year: Never true    Ran Out of Food in the Last Year: Never true  Transportation Needs: No Transportation Needs (09/09/2022)   PRAPARE - Administrator, Civil Service (Medical): No    Lack of Transportation (Non-Medical): No  Physical Activity: Unknown (01/30/2018)   Received [facility], [facility]   Exercise Vital Sign    Days of Exercise per Week: 7 days    Minutes of Exercise per Session: Not on file  Stress: Not on file  Social Connections: Not on file     Family History: The patient's family history includes Bladder Cancer in her brother; Breast cancer in her paternal grandmother; Colon cancer in her father and maternal uncle; Diabetes in her brother and sister; Heart disease in her brother; Leukemia in her mother; Prostate cancer in her brother and brother. There is no history of Ovarian cancer or Kidney cancer.  ROS:   Please see the history of present illness.     All other systems reviewed and are negative.  EKGs/Labs/Other Studies Reviewed:    The following studies were reviewed today:  Echo 09/2022 1. No LVOT obstruction. Left ventricular ejection fraction, by  estimation, is 55 to 60%. The left ventricle has  normal function. The left  ventricle has no regional wall motion abnormalities. There is severe  asymmetric left ventricular hypertrophy of the   septal segment. Left ventricular diastolic parameters are consistent with  Grade  II diastolic dysfunction (pseudonormalization).   2. Right ventricular systolic function is normal. The right ventricular  size is normal.   3. Left atrial size was severely dilated.   4. The mitral valve has been repaired/replaced. Mild mitral valve  regurgitation. The mean mitral valve gradient is 4.0 mmHg.   5. The aortic valve is tricuspid. Aortic valve regurgitation is not  visualized. Aortic valve sclerosis/calcification is present, without any  evidence of aortic stenosis.   Echo 03/2021 1. Left ventricular ejection fraction, by estimation, is 55 to 60%. The  left ventricle has normal function. The left ventricle has no regional  wall motion abnormalities. There is moderate concentric left ventricular  hypertrophy. Left ventricular  diastolic parameters are indeterminate.   2. Right ventricular systolic function is normal. The right ventricular  size is normal. Mildly increased right ventricular wall thickness. There  is normal pulmonary artery systolic pressure. The estimated right  ventricular systolic pressure is 24.7  mmHg.   3. Left atrial size was mildly dilated.   4. A small pericardial effusion is present.   5. The mitral valve is normal in structure. No evidence of mitral valve  regurgitation. No evidence of mitral stenosis.   6. The aortic valve is normal in structure. Aortic valve regurgitation is  not visualized. Aortic valve sclerosis is present, with no evidence of  aortic valve stenosis.   7. The inferior vena cava is normal in size with greater than 50%  respiratory variability, suggesting right atrial pressure of 3 mmHg.   Calcium score 2022 IMPRESSION: 1. The left atrial appendage is a small chicken wing morphology without thrombus  on delayed imaging.   2. Landing zone measurements support a 20 mm Watchman FLX (13% compression), but the LAA length is shallow (maximum length 7.6 mm) and does not appear it will support Watchman for LAAO. Recommend discussion with structural heart team.   3. There is contrast mixing artifact on contrast imaging, but no thrombus on delayed imaging.   4. A small PFO is present.   5. Normal coronary origin. Right dominance.   6. CAC score of 1351, which is 94th percentile for age-, sex-, and race-matched controls.   7. Dilated pulmonary artery, suggestive of pulmonary hypertension.    EKG:  EKG is ordered today.  The ekg ordered today demonstrates AV paced rhythm 76bpm  Recent Labs: 09/11/2022: TSH 0.466 11/04/2022: ALT 20; BUN 14; Creatinine 0.74; Hemoglobin 9.8; Platelet Count 54; Potassium 4.0; Sodium 139  Recent Lipid Panel    Component Value Date/Time   CHOL 160 11/30/2020 0944   TRIG 156 (H) 11/30/2020 0944   HDL 26 (L) 11/30/2020 0944   CHOLHDL 6.2 (H) 11/30/2020 0944   LDLCALC 106 (H) 11/30/2020 0944   Physical Exam:    VS:  BP (!) 102/54 (BP Location: Right Arm, Patient Position: Sitting, Cuff Size: Normal)   Pulse 76   Ht 5\' 3"  (1.6 m)   Wt 143 lb 9.6 oz (65.1 kg)   LMP  (LMP Unknown)   SpO2 99%   BMI 25.44 kg/m     Wt Readings from Last 3 Encounters:  11/15/22 143 lb 9.6 oz (65.1 kg)  11/13/22 141 lb 4 oz (64.1 kg)  11/04/22 140 lb 3.2 oz (63.6 kg)     GEN:  Well nourished, well developed in no acute distress HEENT: Normal NECK: No JVD; No carotid bruits LYMPHATICS: No lymphadenopathy CARDIAC: RRR, no murmurs, rubs, gallops RESPIRATORY:  Clear to auscultation without rales, wheezing or  rhonchi  ABDOMEN: Soft, non-tender, non-distended MUSCULOSKELETAL:  No edema; No deformity  SKIN: Warm and dry NEUROLOGIC:  Alert and oriented x 3 PSYCHIATRIC:  Normal affect   ASSESSMENT:    1. Pre-op evaluation   2. History of GI bleed   3. Tachy-brady  syndrome (HCC)   4. Paroxysmal atrial fibrillation (HCC)   5. S/P placement of cardiac pacemaker   6. LVH (left ventricular hypertrophy)    PLAN:    In order of problems listed above:  Pre-op evaluation H/o GIB and cirrhosis Paroxysmal Afib S/p mitral valve surgery Tachybrady syndrome s/p PPM and AV junction ablation LVH Patient has had two EGDs this year, and is needing another one. No date has been set. Patient denies any anginal symptoms. She is euvolemic on exam. Recent echo showed normal LVEF with G2DD with severe LVH with no LVOT obstruction, normally functioning mitral valve. Unable to do cardiac CTA due to inability to lower HR given low BP. cMRI has been ordered and scheduled for 12/12/22. She recently saw EP and has a normally functioning device. She is not on a NOAC due to h/o GIB and cirrhosis/esophageal varices. METS<4. According to the RCRI patient is low to moderate risk, 6% 30-day risk of death, MI, or cardiac arrest. No further cardiac work-up required prior to surgery. Continue nadolol 20 mg daily, fenofibrate 160mg  daily, simvastatin 20 mg daily, spironolactone 12.5 mg daily, torsemide 20 mg every other day.   Disposition: Follow up in 4 month(s) with MD/APP    Signed, Abigayl Hor David Stall, PA-C  11/15/2022 2:11 PM    [facility]

## 2022-11-15 NOTE — Patient Instructions (Signed)
Medication Instructions:  Your Physician recommend you continue on your current medication as directed.    *If you need a refill on your cardiac medications before your next appointment, please call your pharmacy*   Lab Work: None ordered.   If you have labs (blood work) drawn today and your tests are completely normal, you will receive your results only by: MyChart Message (if you have MyChart) OR A paper copy in the mail If you have any lab test that is abnormal or we need to change your treatment, we will call you to review the results.   Testing/Procedures: None ordered.    Follow-Up: At [facility], you and your health needs are our priority.  As part of our continuing mission to provide you with exceptional heart care, we have created designated Provider Care Teams.  These Care Teams include your primary Cardiologist (physician) and Advanced Practice Providers (APPs -  Physician Assistants and Nurse Practitioners) who all work together to provide you with the care you need, when you need it.  We recommend signing up for the patient portal called "MyChart".  Sign up information is provided on this After Visit Summary.  MyChart is used to connect with patients for Virtual Visits (Telemedicine).  Patients are able to view lab/test results, encounter notes, upcoming appointments, etc.  Non-urgent messages can be sent to your provider as well.   To learn more about what you can do with MyChart, go to [email or web address].    Your next appointment:   4 month(s)  Provider:   You may see Yvonne Kendall, MD or one of the following Advanced Practice Providers on your designated Care Team:   Nicolasa Ducking, NP Eula Listen, PA-C Cadence Fransico Michael, PA-C Charlsie Quest, NP

## 2022-11-19 ENCOUNTER — Telehealth: Payer: Self-pay

## 2022-11-19 ENCOUNTER — Telehealth: Payer: Self-pay | Admitting: Internal Medicine

## 2022-11-19 NOTE — Telephone Encounter (Signed)
Pt is here in office. Pt states that the cardiac  clearance has been sent to [facility] and would like to schedule procedure.    [phone]

## 2022-11-19 NOTE — Telephone Encounter (Signed)
   Pre-operative Risk Assessment    Patient Name: Annette Foster  DOB: 08-Sep-1940 MRN: [id number]      Request for Surgical Clearance    Procedure:   EGD  Date of Surgery:  Clearance TBD                                 Surgeon:  Not indicated Surgeon's Group or Practice Name:  [facility] Phone number:  [phone] Fax number:  [phone]   Type of Clearance Requested:   - Medical    Type of Anesthesia:  General    Additional requests/questions:    SignedMaceo Pro Schools   11/19/2022, 3:30 PM

## 2022-11-20 NOTE — Telephone Encounter (Signed)
   Patient Name: Annette Foster  DOB: 07-08-1940 MRN: [id number]  Primary Cardiologist: Yvonne Kendall, MD  Chart reviewed as part of pre-operative protocol coverage. Given past medical history and time since last visit, based on ACC/AHA guidelines, Savanna Dooley is at acceptable risk for the planned procedure without further cardiovascular testing.  Patient was cleared for EGD at office visit by Cadence Fransico Michael, PA, on 11/15/2022.   I will route this recommendation, as well as the note from most recent office visit to the requesting party via Epic fax function and remove from pre-op pool.  Please call with questions.  Joylene Grapes, NP 11/20/2022, 12:20 PM

## 2022-11-25 ENCOUNTER — Other Ambulatory Visit: Payer: Self-pay

## 2022-11-25 DIAGNOSIS — I8500 Esophageal varices without bleeding: Secondary | ICD-10-CM

## 2022-12-11 ENCOUNTER — Encounter (HOSPITAL_COMMUNITY): Payer: Self-pay

## 2022-12-11 ENCOUNTER — Telehealth (HOSPITAL_COMMUNITY): Payer: Self-pay | Admitting: *Deleted

## 2022-12-11 NOTE — Telephone Encounter (Signed)
Reaching out to patient to offer assistance regarding upcoming cardiac imaging study; pt verbalizes understanding of appt date/time, parking situation and where to check in, pre-test NPO status and medications ordered, and verified current allergies; name and call back number provided for further questions should they arise Johney Frame RN Navigator Cardiac Imaging [facility] [phone] office [phone] cell   Patient denies metal (has PPM), denies claustrophobia.

## 2022-12-12 ENCOUNTER — Ambulatory Visit (HOSPITAL_COMMUNITY)
Admission: RE | Admit: 2022-12-12 | Discharge: 2022-12-12 | Disposition: A | Discharge status: Home / Self Care | Payer: Medicare PPO | Source: Ambulatory Visit | Attending: Medical | Admitting: Medical

## 2022-12-12 ENCOUNTER — Other Ambulatory Visit: Payer: Self-pay | Admitting: Medical

## 2022-12-12 DIAGNOSIS — I517 Cardiomegaly: Secondary | ICD-10-CM

## 2022-12-12 MED ORDER — GADOBUTROL 1 MMOL/ML IV SOLN
10.0000 mL | Freq: Once | INTRAVENOUS | Status: AC | PRN
  Administered 2022-12-12: 10 mL via INTRAVENOUS

## 2022-12-17 ENCOUNTER — Encounter: Payer: Self-pay | Admitting: Gastroenterology

## 2022-12-20 ENCOUNTER — Other Ambulatory Visit: Payer: Self-pay

## 2022-12-20 ENCOUNTER — Telehealth: Payer: Self-pay | Admitting: Internal Medicine

## 2022-12-20 ENCOUNTER — Other Ambulatory Visit: Payer: Self-pay | Admitting: Internal Medicine

## 2022-12-20 ENCOUNTER — Ambulatory Visit (INDEPENDENT_AMBULATORY_CARE_PROVIDER_SITE_OTHER): Payer: Medicare PPO

## 2022-12-20 DIAGNOSIS — I495 Sick sinus syndrome: Secondary | ICD-10-CM | POA: Diagnosis not present

## 2022-12-20 LAB — CUP PACEART REMOTE DEVICE CHECK
Battery Voltage: 85
Date Time Interrogation Session: 20241108095847
Implantable Lead Connection Status: 753985
Implantable Lead Connection Status: 753985
Implantable Lead Implant Date: 20230210
Implantable Lead Implant Date: 20230210
Implantable Lead Location: 753859
Implantable Lead Location: 753860
Implantable Lead Model: 377169
Implantable Lead Model: 377171
Implantable Lead Serial Number: 7000400966
Implantable Lead Serial Number: 7000402075
Implantable Pulse Generator Implant Date: 20230210
Pulse Gen Model: 407145
Pulse Gen Serial Number: 70331369

## 2022-12-20 MED ORDER — NADOLOL 20 MG PO TABS: 20.0000 mg | ORAL_TABLET | Freq: Every day | ORAL | 3 refills | Status: DC

## 2022-12-20 NOTE — Telephone Encounter (Signed)
*  STAT* If patient is at the pharmacy, call can be transferred to refill team.   1. Which medications need to be refilled? (please list name of each medication and dose if known)   nadolol (CORGARD) 20 MG tablet   2. Would you like to learn more about the convenience, safety, & potential cost savings by using the [facility]?   3. Are you open to using the [facility] (Type [facility]. ).  4. Which pharmacy/location (including street and city if local pharmacy) is medication to be sent to?  [id number] - [place], [place] - [address]   5. Do they need a 30 day or 90 day supply?  90 day  Patient stated she still has some medication left.

## 2022-12-20 NOTE — Telephone Encounter (Signed)
Disp Refills Start End   nadolol (CORGARD) 20 MG tablet 90 tablet 3 12/20/2022 --   Sig - Route: Take 1 tablet (20 mg total) by mouth daily. - Oral   Sent to pharmacy as: nadolol (CORGARD) 20 MG tablet   E-Prescribing Status: Receipt confirmed by pharmacy (12/20/2022 10:55 AM EST)    Pharmacy  [id number] - [place], [place] - [address]

## 2022-12-24 ENCOUNTER — Ambulatory Visit: Payer: Medicare PPO | Admitting: Anesthesiology

## 2022-12-24 ENCOUNTER — Encounter: Payer: Self-pay | Admitting: Gastroenterology

## 2022-12-24 ENCOUNTER — Ambulatory Visit
Admission: RE | Admit: 2022-12-24 | Discharge: 2022-12-24 | Disposition: A | Discharge status: Home / Self Care | Payer: Medicare PPO | Attending: Gastroenterology | Admitting: Gastroenterology

## 2022-12-24 ENCOUNTER — Encounter
Admission: RE | Disposition: A | Discharge status: Home / Self Care | Payer: Self-pay | Source: Home / Self Care | Attending: Gastroenterology

## 2022-12-24 DIAGNOSIS — E119 Type 2 diabetes mellitus without complications: Secondary | ICD-10-CM | POA: Diagnosis not present

## 2022-12-24 DIAGNOSIS — E039 Hypothyroidism, unspecified: Secondary | ICD-10-CM | POA: Diagnosis not present

## 2022-12-24 DIAGNOSIS — I509 Heart failure, unspecified: Secondary | ICD-10-CM | POA: Insufficient documentation

## 2022-12-24 DIAGNOSIS — I8500 Esophageal varices without bleeding: Secondary | ICD-10-CM

## 2022-12-24 DIAGNOSIS — I2510 Atherosclerotic heart disease of native coronary artery without angina pectoris: Secondary | ICD-10-CM | POA: Insufficient documentation

## 2022-12-24 DIAGNOSIS — G4730 Sleep apnea, unspecified: Secondary | ICD-10-CM | POA: Insufficient documentation

## 2022-12-24 DIAGNOSIS — I851 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 bleeding: Secondary | ICD-10-CM | POA: Diagnosis not present

## 2022-12-24 DIAGNOSIS — Z853 Personal history of malignant neoplasm of breast: Secondary | ICD-10-CM | POA: Diagnosis not present

## 2022-12-24 DIAGNOSIS — K7460 Unspecified cirrhosis of liver: Secondary | ICD-10-CM | POA: Insufficient documentation

## 2022-12-24 DIAGNOSIS — K766 Portal hypertension: Secondary | ICD-10-CM | POA: Insufficient documentation

## 2022-12-24 DIAGNOSIS — K760 Fatty (change of) liver, not elsewhere classified: Secondary | ICD-10-CM | POA: Diagnosis not present

## 2022-12-24 DIAGNOSIS — K219 Gastro-esophageal reflux disease without esophagitis: Secondary | ICD-10-CM | POA: Insufficient documentation

## 2022-12-24 DIAGNOSIS — Z950 Presence of cardiac pacemaker: Secondary | ICD-10-CM | POA: Insufficient documentation

## 2022-12-24 DIAGNOSIS — K3189 Other diseases of stomach and duodenum: Secondary | ICD-10-CM | POA: Diagnosis not present

## 2022-12-24 DIAGNOSIS — I110 Hypertensive heart disease with heart failure: Secondary | ICD-10-CM | POA: Insufficient documentation

## 2022-12-24 HISTORY — PX: ESOPHAGOGASTRODUODENOSCOPY (EGD) WITH PROPOFOL: SHX5813

## 2022-12-24 HISTORY — PX: ESOPHAGEAL BANDING: SHX5518

## 2022-12-24 LAB — GLUCOSE, CAPILLARY: Glucose-Capillary: 131 mg/dL — ABNORMAL HIGH (ref 70–99)

## 2022-12-24 SURGERY — ESOPHAGOGASTRODUODENOSCOPY (EGD) WITH PROPOFOL
Anesthesia: General

## 2022-12-24 MED ORDER — SODIUM CHLORIDE 0.9 % IV SOLN: INTRAVENOUS | Status: DC

## 2022-12-24 MED ORDER — PROPOFOL 10 MG/ML IV BOLUS
INTRAVENOUS | Status: DC | PRN
  Administered 2022-12-24: 70 mg via INTRAVENOUS

## 2022-12-24 MED ORDER — PROPOFOL 500 MG/50ML IV EMUL
INTRAVENOUS | Status: DC | PRN
  Administered 2022-12-24: 125 ug/kg/min via INTRAVENOUS

## 2022-12-24 NOTE — Transfer of Care (Signed)
Immediate Anesthesia Transfer of Care Note  Patient: Annette Foster  Procedure(s) Performed: ESOPHAGOGASTRODUODENOSCOPY (EGD) WITH PROPOFOL ESOPHAGEAL BANDING  Patient Location: Endoscopy Unit  Anesthesia Type:General  Level of Consciousness: drowsy and patient cooperative  Airway & Oxygen Therapy: Patient Spontanous Breathing  Post-op Assessment: Report given to RN, Post -op Vital signs reviewed and stable, and Patient moving all extremities X 4  Post vital signs: Reviewed and stable  Last Vitals:  Vitals Value Taken Time  BP 113/54 12/24/22 0946  Temp    Pulse 80 12/24/22 0946  Resp 21 12/24/22 0946  SpO2 99 % 12/24/22 0946  Vitals shown include unfiled device data.  Last Pain:  Vitals:   12/24/22 0843  TempSrc: Temporal         Complications: No notable events documented.

## 2022-12-24 NOTE — Anesthesia Preprocedure Evaluation (Signed)
Anesthesia Evaluation  Patient identified by MRN, date of birth, ID band Patient awake    Reviewed: Allergy & Precautions, NPO status , Patient's Chart, lab work & pertinent test results  History of Anesthesia Complications (+) PONV and history of anesthetic complications  Airway Mallampati: III  TM Distance: <3 FB Neck ROM: full    Dental  (+) Chipped, Poor Dentition, Missing, Partial Lower, Upper Dentures   Pulmonary sleep apnea    Pulmonary exam normal        Cardiovascular hypertension, + CAD and +CHF  Normal cardiovascular exam+ dysrhythmias + pacemaker + Valvular Problems/Murmurs      Neuro/Psych negative neurological ROS  negative psych ROS   GI/Hepatic ,GERD  Controlled,,(+) Hepatitis -  Endo/Other  diabetesHypothyroidism    Renal/GU Renal disease  negative genitourinary   Musculoskeletal   Abdominal   Peds  Hematology negative hematology ROS (+)   Anesthesia Other Findings Past Medical History: No date: Abdominal pain No date: Allergy 2017: Cancer (HCC)     Comment:  breast cancer- Left No date: Cataract No date: CHF (congestive heart failure) (HCC) No date: Collagenous colitis No date: Coronary artery disease No date: Diabetes mellitus without complication (HCC) No date: Diarrhea No date: Diverticulosis No date: Dysrhythmia No date: Fatty liver disease, nonalcoholic No date: Fibrocystic breast No date: GERD (gastroesophageal reflux disease) No date: Heart murmur No date: Hyperlipidemia No date: Hypertension No date: Hypothyroidism No date: IBS (irritable bowel syndrome) No date: IDA (iron deficiency anemia) No date: Liver cirrhosis (HCC) 2017: Personal history of radiation therapy     Comment:  LEFT BREAST CA No date: PONV (postoperative nausea and vomiting) No date: Presence of permanent cardiac pacemaker No date: Sleep apnea     Comment:  C-Pap  Past Surgical History: No date:  ABDOMINAL HYSTERECTOMY     Comment:  [place] No date: ABDOMINAL SURGERY No date: APPENDECTOMY 05/17/2021: AV NODE ABLATION; N/A     Comment:  Procedure: AV NODE ABLATION;  Surgeon: Lanier Prude, MD;  Location: [facility];  Service:               Cardiovascular;  Laterality: N/A; 2016: BREAST BIOPSY; Bilateral     Comment:  negative 09/11/2015: BREAST BIOPSY; Left     Comment:  DCIS, papillary carcinoma in situ 05/27/2016: BREAST BIOPSY; Left     Comment:  BENIGN MAMMARY EPITHELIUM 11/20/2017: BREAST BIOPSY; Left     Comment:  affirm bx x clip BENIGN MAMMARY EPITHELIUM CONSISTENT               WITH RAD THERAPY No date: BREAST EXCISIONAL BIOPSY; Right     Comment:  NEG 1980's 10/17/2015: BREAST LUMPECTOMY; Left     Comment:  DCIS and papillary carcinoma insitu, clear margins No date: CARDIAC SURGERY     Comment:  has replacement valve No date: CATARACT EXTRACTION; Right 01/16/2021: CATARACT EXTRACTION W/PHACO; Left     Comment:  Procedure: CATARACT EXTRACTION PHACO AND INTRAOCULAR               LENS PLACEMENT (IOC) LEFT DIABETIC 8.46 00:59.8;                Surgeon: Galen Manila, MD;  Location: [facility] SURGERY              CN[facility];  Service: Ophthalmology;  Laterality: Left; No date: CHOLECYSTECTOMY 03/11/2016: COLONOSCOPY WITH PROPOFOL; N/A  Comment:  Procedure: COLONOSCOPY WITH PROPOFOL;  Surgeon: Scot Jun, MD;  Location: [facility] ENDOSCOPY;  Service:               Endoscopy;  Laterality: N/A; 02/18/2020: COLONOSCOPY WITH PROPOFOL; N/A     Comment:  Procedure: COLONOSCOPY WITH PROPOFOL;  Surgeon: Midge Minium, MD;  Location: [facility] ENDOSCOPY;  Service:               Endoscopy;  Laterality: N/A; 09/10/2022: COLONOSCOPY WITH PROPOFOL; N/A     Comment:  Procedure: COLONOSCOPY WITH PROPOFOL;  Surgeon: Wyline Mood, MD;  Location: [facility] ENDOSCOPY;  Service:               Gastroenterology;  Laterality:  N/A; 09/10/2022: ESOPHAGEAL BANDING     Comment:  Procedure: ESOPHAGEAL BANDING;  Surgeon: Wyline Mood,               MD;  Location: [facility] ENDOSCOPY;  Service:               Gastroenterology;; 03/11/2016: ESOPHAGOGASTRODUODENOSCOPY (EGD) WITH PROPOFOL; N/A     Comment:  Procedure: ESOPHAGOGASTRODUODENOSCOPY (EGD) WITH               PROPOFOL;  Surgeon: Scot Jun, MD;  Location: [facility]              ENDOSCOPY;  Service: Endoscopy;  Laterality: N/A; 02/18/2020: ESOPHAGOGASTRODUODENOSCOPY (EGD) WITH PROPOFOL; N/A     Comment:  Procedure: ESOPHAGOGASTRODUODENOSCOPY (EGD) WITH               PROPOFOL;  Surgeon: Midge Minium, MD;  Location: [facility]               ENDOSCOPY;  Service: Endoscopy;  Laterality: N/A; 04/25/2020: ESOPHAGOGASTRODUODENOSCOPY (EGD) WITH PROPOFOL; N/A     Comment:  Procedure: ESOPHAGOGASTRODUODENOSCOPY (EGD) WITH               PROPOFOL;  Surgeon: Midge Minium, MD;  Location: [facility]               ENDOSCOPY;  Service: Endoscopy;  Laterality: N/A; 05/23/2020: ESOPHAGOGASTRODUODENOSCOPY (EGD) WITH PROPOFOL; N/A     Comment:  Procedure: ESOPHAGOGASTRODUODENOSCOPY (EGD) WITH               PROPOFOL;  Surgeon: Midge Minium, MD;  Location: [facility]               ENDOSCOPY;  Service: Endoscopy;  Laterality: N/A; 09/10/2022: ESOPHAGOGASTRODUODENOSCOPY (EGD) WITH PROPOFOL; N/A     Comment:  Procedure: ESOPHAGOGASTRODUODENOSCOPY (EGD) WITH               PROPOFOL;  Surgeon: Wyline Mood, MD;  Location: [facility]               ENDOSCOPY;  Service: Gastroenterology;  Laterality: N/A; 10/04/2021: EUS; N/A     Comment:  Procedure: LOWER ENDOSCOPIC ULTRASOUND (EUS);  Surgeon:               Doren Custard, MD;  Location: [facility] ENDOSCOPY;                Service: Gastroenterology;  Laterality: N/A;  [facility] No date: EYE SURGERY 09/03/2018: FINGER ARTHROSCOPY WITH CARPOMETACARPEL ([facility])  ARTHROPLASTY; Right     Comment:  Procedure: CARPOMETACARPEL (  [facility]) ARTHROPLASTY RIGHT               THUMB;  Surgeon:  Kennedy Bucker, MD;  Location: [facility] ORS;               Service: Orthopedics;  Laterality: Right; 09/14/2021: FLEXIBLE SIGMOIDOSCOPY; N/A     Comment:  Procedure: FLEXIBLE SIGMOIDOSCOPY;  Surgeon: Midge Minium, MD;  Location: [facility] ENDOSCOPY;  Service:               Endoscopy;  Laterality: N/A;  No anesthesia 09/03/2018: GANGLION CYST EXCISION; Right     Comment:  Procedure: REMOVAL GANGLION OF WRIST;  Surgeon: Kennedy Bucker, MD;  Location: [facility] ORS;  Service: Orthopedics;               Laterality: Right; 09/03/2018: HARDWARE REMOVAL; Right     Comment:  Procedure: HARDWARE REMOVAL RIGHT THUMB;  Surgeon: Kennedy Bucker, MD;  Location: [facility] ORS;  Service: Orthopedics;               Laterality: Right;  staple removed 09/10/2022: HEMOSTASIS CLIP PLACEMENT     Comment:  Procedure: HEMOSTASIS CLIP PLACEMENT;  Surgeon: Wyline Mood, MD;  Location: [facility] ENDOSCOPY;  Service:               Gastroenterology;; 09/10/2022: HEMOSTASIS CONTROL     Comment:  Procedure: HEMOSTASIS CONTROL;  Surgeon: Wyline Mood,               MD;  Location: [facility] ENDOSCOPY;  Service:               Gastroenterology;; No date: JOINT REPLACEMENT; Left     Comment:  TKR No date: left sinusplasty  10/17/2015: MASTECTOMY, PARTIAL; Left     Comment:  Procedure: MASTECTOMY PARTIAL REVISION;  Surgeon: Nadeen Landau, MD;  Location: [facility] ORS;  Service: General;              Laterality: Left; 03/23/2021: PACEMAKER IMPLANT; N/A     Comment:  Procedure: PACEMAKER IMPLANT;  Surgeon: Lanier Prude, MD;  Location: [facility];  Service:               Cardiovascular;  Laterality: N/A; 09/29/2015: PARTIAL MASTECTOMY WITH NEEDLE LOCALIZATION; Left     Comment:  Procedure: PARTIAL MASTECTOMY WITH NEEDLE LOCALIZATION;               Surgeon: Nadeen Landau, MD;  Location: [facility] ORS;                Service: General;  Laterality:  Left; 09/10/2022: POLYPECTOMY     Comment:  Procedure: POLYPECTOMY;  Surgeon: Wyline Mood, MD;                Location: [facility] ENDOSCOPY;  Service: Gastroenterology;; 10/05/2021: RECTAL EXAM UNDER ANESTHESIA; N/A     Comment:  Procedure: RECTAL EXAM UNDER ANESTHESIA, ASPIRATION OF               RECTAL CYST;  Surgeon: Carolan Shiver, MD;  Location: [facility] ORS;  Service: General;  Laterality: N/A; No date: TOTAL ABDOMINAL HYSTERECTOMY W/ BILATERAL SALPINGOOPHORECTOMY  BMI    Body Mass Index: 25.15 kg/m      Reproductive/Obstetrics negative OB ROS                              Anesthesia Physical Anesthesia Plan  ASA: 3  Anesthesia Plan: General   Post-op Pain Management:    Induction: Intravenous  PONV Risk Score and Plan: Propofol infusion and TIVA  Airway Management Planned: Natural Airway and Nasal Cannula  Additional Equipment:   Intra-op Plan:   Post-operative Plan:   Informed Consent: I have reviewed the patients History and Physical, chart, labs and discussed the procedure including the risks, benefits and alternatives for the proposed anesthesia with the patient or authorized representative who has indicated his/her understanding and acceptance.     Dental Advisory Given  Plan Discussed with: Anesthesiologist, CRNA and Surgeon  Anesthesia Plan Comments: (Patient consented for risks of anesthesia including but not limited to:  - adverse reactions to medications - risk of airway placement if required - damage to eyes, teeth, lips or other oral mucosa - nerve damage due to positioning  - sore throat or hoarseness - Damage to heart, brain, nerves, lungs, other parts of body or loss of life  Patient voiced understanding.)        Anesthesia Quick Evaluation

## 2022-12-24 NOTE — H&P (Signed)
Annette Minium, MD [facility] [address] [place], [place] [address] [phone] Fax : [phone]  Primary Care Physician:  Jaclyn Shaggy, MD Primary Gastroenterologist:  Dr. Servando Snare  Pre-Procedure History & Physical: HPI:  Annette Foster is a 82 y.o. female is here for an endoscopy.   Past Medical History:  Diagnosis Date   Abdominal pain    Allergy    Cancer (HCC) 2017   breast cancer- Left   Cataract    CHF (congestive heart failure) (HCC)    Collagenous colitis    Coronary artery disease    Diabetes mellitus without complication (HCC)    Diarrhea    Diverticulosis    Dysrhythmia    Fatty liver disease, nonalcoholic    Fibrocystic breast    GERD (gastroesophageal reflux disease)    Heart murmur    Hyperlipidemia    Hypertension    Hypothyroidism    IBS (irritable bowel syndrome)    IDA (iron deficiency anemia)    Liver cirrhosis (HCC)    Personal history of radiation therapy 2017   LEFT BREAST CA   PONV (postoperative nausea and vomiting)    Presence of permanent cardiac pacemaker    Sleep apnea    C-Pap    Past Surgical History:  Procedure Laterality Date   ABDOMINAL HYSTERECTOMY     tah bso   ABDOMINAL SURGERY     APPENDECTOMY     AV NODE ABLATION N/A 05/17/2021   Procedure: AV NODE ABLATION;  Surgeon: Lanier Prude, MD;  [facility];  Service: Cardiovascular;  Laterality: N/A;   BREAST BIOPSY Bilateral 2016   negative   BREAST 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/2015   DCIS, papillary carcinoma in situ   BREAST BIOPSY Left 05/27/2016   BENIGN MAMMARY EPITHELIUM   BREAST BIOPSY Left 11/20/2017   affirm bx x clip BENIGN MAMMARY EPITHELIUM CONSISTENT WITH RAD THERAPY   BREAST EXCISIONAL BIOPSY Right    NEG 1980's   BREAST LUMPECTOMY Left 10/17/2015   DCIS and papillary carcinoma insitu, clear margins   CARDIAC SURGERY     has replacement valve   CATARACT EXTRACTION Right    CATARACT EXTRACTION W/PHACO Left 01/16/2021    Procedure: CATARACT EXTRACTION PHACO AND INTRAOCULAR LENS PLACEMENT (IOC) LEFT DIABETIC 8.46 00:59.8;  Surgeon: Galen Manila, MD;  Location: [facility];  Service: Ophthalmology;  Laterality: Left;   CHOLECYSTECTOMY     COLONOSCOPY WITH PROPOFOL N/A 03/11/2016   Procedure: COLONOSCOPY WITH PROPOFOL;  Surgeon: Scot Jun, MD;  Location: [facility] ENDOSCOPY;  Service: Endoscopy;  Laterality: N/A;   COLONOSCOPY WITH PROPOFOL N/A 02/18/2020   Procedure: COLONOSCOPY WITH PROPOFOL;  Surgeon: Annette Minium, MD;  Location: [facility] ENDOSCOPY;  Service: Endoscopy;  Laterality: N/A;   COLONOSCOPY WITH PROPOFOL N/A 09/10/2022   Procedure: COLONOSCOPY WITH PROPOFOL;  Surgeon: Wyline Mood, MD;  Location: [facility] ENDOSCOPY;  Service: Gastroenterology;  Laterality: N/A;   ESOPHAGEAL BANDING  09/10/2022   Procedure: ESOPHAGEAL BANDING;  Surgeon: Wyline Mood, MD;  Location: [facility] ENDOSCOPY;  Service: Gastroenterology;;   ESOPHAGEAL BANDING  10/17/2022   Procedure: ESOPHAGEAL BANDING;  Surgeon: Annette Minium, MD;  Location: [facility] ENDOSCOPY;  Service: Endoscopy;;   ESOPHAGOGASTRODUODENOSCOPY (EGD) WITH PROPOFOL N/A 03/11/2016   Procedure: ESOPHAGOGASTRODUODENOSCOPY (EGD) WITH PROPOFOL;  Surgeon: Scot Jun, MD;  Location: [facility] ENDOSCOPY;  Service: Endoscopy;  Laterality: N/A;   ESOPHAGOGASTRODUODENOSCOPY (EGD) WITH PROPOFOL N/A 02/18/2020   Procedure: ESOPHAGOGASTRODUODENOSCOPY (EGD) WITH PROPOFOL;  Surgeon: Annette Minium, MD;  Location: [facility]  ENDOSCOPY;  Service: Endoscopy;  Laterality: N/A;   ESOPHAGOGASTRODUODENOSCOPY (EGD) WITH PROPOFOL N/A 04/25/2020   Procedure: ESOPHAGOGASTRODUODENOSCOPY (EGD) WITH PROPOFOL;  Surgeon: Annette Minium, MD;  Location: [facility] ENDOSCOPY;  Service: Endoscopy;  Laterality: N/A;   ESOPHAGOGASTRODUODENOSCOPY (EGD) WITH PROPOFOL N/A 05/23/2020   Procedure: ESOPHAGOGASTRODUODENOSCOPY (EGD) WITH PROPOFOL;  Surgeon: Annette Minium, MD;  Location: [facility] ENDOSCOPY;  Service: Endoscopy;   Laterality: N/A;   ESOPHAGOGASTRODUODENOSCOPY (EGD) WITH PROPOFOL N/A 09/10/2022   Procedure: ESOPHAGOGASTRODUODENOSCOPY (EGD) WITH PROPOFOL;  Surgeon: Wyline Mood, MD;  Location: [facility] ENDOSCOPY;  Service: Gastroenterology;  Laterality: N/A;   ESOPHAGOGASTRODUODENOSCOPY (EGD) WITH PROPOFOL N/A 10/17/2022   Procedure: ESOPHAGOGASTRODUODENOSCOPY (EGD) WITH PROPOFOL;  Surgeon: Annette Minium, MD;  Location: [facility] ENDOSCOPY;  Service: Endoscopy;  Laterality: N/A;   EUS N/A 10/04/2021   Procedure: LOWER ENDOSCOPIC ULTRASOUND (EUS);  Surgeon: Doren Custard, MD;  Location: [facility] ENDOSCOPY;  Service: Gastroenterology;  Laterality: N/A;  [facility]   EYE SURGERY     FINGER ARTHROSCOPY WITH CARPOMETACARPEL (CMC) ARTHROPLASTY Right 09/03/2018   Procedure: CARPOMETACARPEL [facility]) ARTHROPLASTY RIGHT THUMB;  Surgeon: Kennedy Bucker, MD;  Location: [facility] ORS;  Service: Orthopedics;  Laterality: Right;   FLEXIBLE SIGMOIDOSCOPY N/A 09/14/2021   Procedure: FLEXIBLE SIGMOIDOSCOPY;  Surgeon: Annette Minium, MD;  Location: [facility] ENDOSCOPY;  Service: Endoscopy;  Laterality: N/A;  No anesthesia   GANGLION CYST EXCISION Right 09/03/2018   Procedure: REMOVAL GANGLION OF WRIST;  Surgeon: Kennedy Bucker, MD;  Location: [facility] ORS;  Service: Orthopedics;  Laterality: Right;   HARDWARE REMOVAL Right 09/03/2018   Procedure: HARDWARE REMOVAL RIGHT THUMB;  Surgeon: Kennedy Bucker, MD;  Location: [facility] ORS;  Service: Orthopedics;  Laterality: Right;  staple removed   HEMOSTASIS CLIP PLACEMENT  09/10/2022   Procedure: HEMOSTASIS CLIP PLACEMENT;  Surgeon: Wyline Mood, MD;  Location: [facility] ENDOSCOPY;  Service: Gastroenterology;;   HEMOSTASIS CONTROL  09/10/2022   Procedure: HEMOSTASIS CONTROL;  Surgeon: Wyline Mood, MD;  Location: [facility] ENDOSCOPY;  Service: Gastroenterology;;   JOINT REPLACEMENT Left    TKR   left sinusplasty      MASTECTOMY, PARTIAL Left 10/17/2015   Procedure: MASTECTOMY PARTIAL REVISION;  Surgeon: Nadeen Landau, MD;   Location: [facility] ORS;  Service: General;  Laterality: Left;   PACEMAKER IMPLANT N/A 03/23/2021   Procedure: PACEMAKER IMPLANT;  Surgeon: Lanier Prude, MD;  [facility];  Service: Cardiovascular;  Laterality: N/A;   PARTIAL MASTECTOMY WITH NEEDLE LOCALIZATION Left 09/29/2015   Procedure: PARTIAL MASTECTOMY WITH NEEDLE LOCALIZATION;  Surgeon: Nadeen Landau, MD;  Location: [facility] ORS;  Service: General;  Laterality: Left;   POLYPECTOMY  09/10/2022   Procedure: POLYPECTOMY;  Surgeon: Wyline Mood, MD;  Location: [facility] ENDOSCOPY;  Service: Gastroenterology;;   RECTAL EXAM UNDER ANESTHESIA N/A 10/05/2021   Procedure: RECTAL EXAM UNDER ANESTHESIA, ASPIRATION OF RECTAL CYST;  Surgeon: Carolan Shiver, MD;  Location: [facility] ORS;  Service: General;  Laterality: N/A;   TOTAL ABDOMINAL HYSTERECTOMY W/ BILATERAL SALPINGOOPHORECTOMY      Prior to Admission medications   Medication Sig Start Date End Date Taking? Authorizing Provider  Azelastine HCl 137 MCG/SPRAY SOLN INHALE 1-2 PUFFS IN EACH NOSTRIL NASALLY ONCE A DAY 30 DAY(S)   Yes [provider]  Cholecalciferol (VITAMIN D) 50 MCG (2000 UT) CAPS Take 2,000 Units by mouth daily.   Yes [provider]  cyanocobalamin (,VITAMIN B-12,) 1000 MCG/ML injection 1,000 mcg every 30 (thirty) days. 04/23/19  Yes [provider]  dicyclomine (BENTYL) 10 MG capsule Take 10 mg by mouth  in the morning, at noon, and at bedtime. 05/16/19  Yes [provider]  esomeprazole (NEXIUM) 40 MG capsule Take 40 mg by mouth 2 (two) times daily before a meal.  02/08/16  Yes [provider]  gabapentin (NEURONTIN) 300 MG capsule Take 300 mg by mouth at bedtime.  09/07/16 10/25/36 Yes [provider]  levocetirizine (XYZAL) 5 MG tablet Take 5 mg by mouth every evening.   Yes [provider]  levothyroxine (SYNTHROID, LEVOTHROID) 75 MCG tablet Take 75 mcg by mouth daily before breakfast.    Yes [provider]  magnesium oxide (MAG-OX) 400 MG tablet Take 400 mg by mouth 2 (two) times daily.    Yes [provider]  nadolol (CORGARD) 20 MG tablet Take 1 tablet (20 mg total) by mouth daily. 12/20/22  Yes End, Cristal Deer, MD  NOVOLOG FLEXPEN 100 UNIT/ML FlexPen Inject 48-68 Units into the skin 3 (three) times daily with meals. Based on sliding scale 08/14/19  Yes [provider]  potassium chloride SA (K-DUR,KLOR-CON) 20 MEQ tablet Take 20 mEq by mouth 2 (two) times daily.    Yes [provider]  torsemide (DEMADEX) 20 MG tablet TAKE 1 TABLET BY MOUTH EVERY DAY 03/02/21  Yes End, Cristal Deer, MD  TRESIBA FLEXTOUCH 200 UNIT/ML FlexTouch Pen 194 Unit(s) SUB-Q Daily   Yes [provider]  benzonatate (TESSALON) 100 MG capsule Take 100 mg by mouth 3 (three) times daily as needed. 10/28/22   [provider]  clobetasol ointment (TEMOVATE) 0.05 % Apply 1 application topically as needed (vaginal irritation).    [provider]  clotrimazole (LOTRIMIN) 1 % cream Apply 1 Application topically 2 (two) times daily as needed.    [provider]  cyclobenzaprine (FLEXERIL) 5 MG tablet Take 1 tablet by mouth 2 (two) times daily. Patient not taking: Reported on 10/17/2022    [provider]  docusate sodium (COLACE) 100 MG capsule Take 100 mg by mouth 3 (three) times daily as needed for moderate constipation. 01/10/20   [provider]  EPINEPHrine 0.3 mg/0.3 mL IJ SOAJ injection Inject 0.3 mg into the muscle as needed for anaphylaxis. Patient not taking: Reported on 11/13/2022 05/18/19   [provider]  estradiol (ESTRACE) 0.1 MG/GM vaginal cream Place 1 Applicatorful vaginally daily as needed (irritation).    [provider]  fenofibrate 160 MG tablet Take 160 mg by mouth at bedtime.    [provider]  hydrOXYzine (ATARAX/VISTARIL) 25 MG tablet Take 25 mg by mouth every 6 (six) hours as needed for anxiety,  itching, nausea or vomiting.    [provider]  metoprolol tartrate (LOPRESSOR) 50 MG tablet TAKE 1 TABLET 2 HR PRIOR TO CARDIAC PROCEDURE 10/04/22   Furth, Cadence H, PA-C  MOUNJARO 7.5 MG/0.5ML Pen Inject 12.5 mg into the skin once a week. 02/15/22   [provider]  NONFORMULARY OR COMPOUNDED ITEM Nifedipine 0.3% plus lidocaine 2% cream apply to rectum BID and after every BM.    [provider]  saccharomyces boulardii (FLORASTOR) 250 MG capsule Take 250 mg by mouth 2 (two) times daily.     [provider]  simvastatin (ZOCOR) 20 MG tablet Take 20 mg by mouth at bedtime.    [provider]  spironolactone (ALDACTONE) 25 MG tablet TAKE 1/2 TABLET BY MOUTH DAILY 05/03/22   End, Cristal Deer, MD  valACYclovir (VALTREX) 500 MG tablet Take 500 mg by mouth 2 (two) times daily as needed (fever blisters). 01/06/17  [provider]    Allergies as of 11/25/2022 - Review Complete 11/15/2022  Allergen Reaction Noted   Codeine Itching, Nausea And Vomiting, and Other (See Comments) 06/28/2014   Demeclocycline Rash 04/10/2012   Demerol [meperidine] Itching and Nausea And Vomiting 06/28/2014   Hydrocodone Itching and Nausea And Vomiting 08/24/2018   Other Other (See Comments) 04/10/2012   Oxycodone Itching and Nausea And Vomiting 09/26/2015   Pentazocine Itching, Nausea And Vomiting, and Other (See Comments) 04/10/2012   Tetracyclines & related Rash 06/28/2014   Coal tar extract Other (See Comments) 03/19/2022   Hydrocodone-acetaminophen Other (See Comments) 01/30/2018   Salicylic acid Other (See Comments) 04/02/2022   Tetracycline hcl Other (See Comments) 04/02/2022   Fentanyl Itching, Nausea And Vomiting, and Other (See Comments) 06/28/2014    Family History  Problem Relation Age of Onset   Breast cancer Paternal Grandmother    Colon cancer Father    Diabetes Sister    Diabetes Brother    Heart disease Brother    Prostate cancer Brother     Colon cancer Maternal Uncle    Prostate cancer Brother    Bladder Cancer Brother    Leukemia Mother        all   Ovarian cancer Neg Hx    Kidney cancer Neg Hx     Social History   Socioeconomic History   Marital status: Married    Spouse name: Chase Picket   Number of children: Not on file   Years of education: Not on file   Highest education level: Not on file  Occupational History   Not on file  Tobacco Use   Smoking status: Never   Smokeless tobacco: Never  Vaping Use   Vaping status: Never Used  Substance and Sexual Activity   Alcohol use: No    Alcohol/week: 0.0 standard drinks of alcohol   Drug use: No   Sexual activity: Not Currently    Birth control/protection: Surgical  Other Topics Concern   Not on file  Social History Narrative    Husband lives at nursing home , lives with husbands sister   Social Determinants of Health   Financial Resource Strain: Not on file  Food Insecurity: No Food Insecurity (09/09/2022)   Hunger Vital Sign    Worried About Running Out of Food in the Last Year: Never true    Ran Out of Food in the Last Year: Never true  Transportation Needs: No Transportation Needs (09/09/2022)   PRAPARE - Administrator, Civil Service (Medical): No    Lack of Transportation (Non-Medical): No  Physical Activity: Unknown (01/30/2018)   Received from [facility], [facility]   Exercise Vital Sign    Days of Exercise per Week: 7 days    Minutes of Exercise per Session: Not on file  Stress: Not on file  Social Connections: Not on file  Intimate Partner Violence: Not At Risk (09/09/2022)   Humiliation, Afraid, Rape, and Kick questionnaire    Fear of Current or Ex-Partner: No    Emotionally Abused: No    Physically Abused: No    Sexually Abused: No    Review of Systems: See HPI, otherwise negative ROS  Physical Exam: BP (!) 121/55   Pulse 72   Temp (!) 96.7 F (35.9 C) (Temporal)   Resp 18   Wt 64.9 kg   LMP  (LMP Unknown)    SpO2 100%   BMI 25.33 kg/m  General:   Alert,  pleasant  and cooperative in NAD Head:  Normocephalic and atraumatic. Neck:  Supple; no masses or thyromegaly. Lungs:  Clear throughout to auscultation.    Heart:  Regular rate and rhythm. Abdomen:  Soft, nontender and nondistended. Normal bowel sounds, without guarding, and without rebound.   Neurologic:  Alert and  oriented x4;  grossly normal neurologically.  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  Questions have been answered.  All parties agreeable.   Annette Minium, MD  12/24/2022, 8:51 AM

## 2022-12-24 NOTE — Op Note (Signed)
[facility]astroenterology Patient Name: Annette Foster Procedure Date: 12/24/2022 9:25 AM MRN: [id number] Account #: [id number] Date of Birth: 1940-08-17 Admit Type: Outpatient Age: 82 Room: [facility] ENDO ROOM 4 Gender: Female Note Status: Finalized Instrument Name: Laurette Schimke [id number] Procedure:             Upper GI endoscopy Indications:           Cirrhosis with suspected esophageal varices Providers:             Midge Minium MD, MD Referring MD:          Midge Minium MD, MD (Referring MD) Medicines:             Propofol per Anesthesia Complications:         No immediate complications. Procedure:             Pre-Anesthesia Assessment:                        - Prior to the procedure, a History and Physical was                         performed, and patient medications and allergies were                         reviewed. The patient's tolerance of previous                         anesthesia was also reviewed. The risks and benefits                         of the procedure and the sedation options and risks                         were discussed with the patient. All questions were                         answered, and informed consent was obtained. Prior                         Anticoagulants: The patient has taken no anticoagulant                         or antiplatelet agents. ASA Grade Assessment: II - A                         patient with mild systemic disease. After reviewing                         the risks and benefits, the patient was deemed in                         satisfactory condition to undergo the procedure.                        After obtaining informed consent, the endoscope was                         passed under direct vision. Throughout the procedure,  the patient's blood pressure, pulse, and oxygen                         saturations were monitored continuously. The Endoscope                         was introduced  through the mouth, and advanced to the                         second part of duodenum. The upper GI endoscopy was                         accomplished with ease. The patient tolerated the                         procedure well. Findings:      Grade II varices were found in the lower third of the esophagus. Two       bands were successfully placed with complete eradication, resulting in       deflation of varices. There was no bleeding during the procedure.      Moderate portal hypertensive gastropathy was found in the entire       examined stomach.      The examined duodenum was normal. Impression:            - Grade II esophageal varices. Completely eradicated.                         Banded.                        - Portal hypertensive gastropathy.                        - Normal examined duodenum.                        - No specimens collected. Recommendation:        - Discharge patient to home.                        - Resume previous diet.                        - Continue present medications. Procedure Code(s):     --- Professional ---                        [phone], Esophagogastroduodenoscopy, flexible,                         transoral; with band ligation of esophageal/gastric                         varices Diagnosis Code(s):     --- Professional ---                        K74.60, Unspecified cirrhosis of liver                        K76.6, Portal hypertension CPT copyright 2022 American Medical Association. All rights reserved. The codes documented  in this report are preliminary and upon coder review may  be revised to meet current compliance requirements. Midge Minium MD, MD 12/24/2022 9:42:59 AM This report has been signed electronically. Number of Addenda: 0 Note Initiated On: 12/24/2022 9:25 AM Estimated Blood Loss:  Estimated blood loss: none.      [facility]

## 2022-12-24 NOTE — OR Nursing (Signed)
Pt was complaining of 8/10 abdominal pain.  Dr Servando Snare explained to the patient that the pain is due to the bands put on the esophageal varices and should dissipate in the next day or two.  MD also advised patient to take Tylenol as needed for the pain, but not to exceed the recommended daily limit of Tylenol.  Patient verbalized understanding.

## 2022-12-24 NOTE — Anesthesia Postprocedure Evaluation (Signed)
Anesthesia Post Note  Patient: Gean Stefanowicz  Procedure(s) Performed: ESOPHAGOGASTRODUODENOSCOPY (EGD) WITH PROPOFOL ESOPHAGEAL BANDING  Patient location during evaluation: Endoscopy Anesthesia Type: General Level of consciousness: awake and alert Pain management: pain level controlled Vital Signs Assessment: post-procedure vital signs reviewed and stable Respiratory status: spontaneous breathing, nonlabored ventilation, respiratory function stable and patient connected to nasal cannula oxygen Cardiovascular status: blood pressure returned to baseline and stable Postop Assessment: no apparent nausea or vomiting Anesthetic complications: no   No notable events documented.   Last Vitals:  Vitals:   12/24/22 0945 12/24/22 1005  BP: (!) 113/54 131/78  Pulse: 76   Resp: 18   Temp: (!) 36.3 C   SpO2: 100%     Last Pain:  Vitals:   12/24/22 1005  TempSrc:   PainSc: 8                  Louie Boston

## 2022-12-25 ENCOUNTER — Encounter: Payer: Self-pay | Admitting: Gastroenterology

## 2022-12-30 ENCOUNTER — Ambulatory Visit (INDEPENDENT_AMBULATORY_CARE_PROVIDER_SITE_OTHER): Payer: Medicare PPO | Admitting: Podiatry

## 2022-12-30 ENCOUNTER — Encounter: Payer: Self-pay | Admitting: Podiatry

## 2022-12-30 DIAGNOSIS — B351 Tinea unguium: Secondary | ICD-10-CM

## 2022-12-30 DIAGNOSIS — E1142 Type 2 diabetes mellitus with diabetic polyneuropathy: Secondary | ICD-10-CM

## 2022-12-30 DIAGNOSIS — M79675 Pain in left toe(s): Secondary | ICD-10-CM

## 2022-12-30 DIAGNOSIS — M79674 Pain in right toe(s): Secondary | ICD-10-CM

## 2022-12-30 NOTE — Progress Notes (Signed)
Subjective:  Patient ID: Annette Foster, female    DOB: 1940/02/27,  MRN: [id number]  82 y.o. female presents at risk foot care with history of diabetic neuropathy and painful thick toenails that are difficult to trim. Pain interferes with ambulation. Aggravating factors include wearing enclosed shoe gear. Pain is relieved with periodic professional debridement. Chief Complaint  Patient presents with   Diabetes    "Cut my toenails."   New problem(s): None   PCP is Jaclyn Shaggy, MD.  Allergies  Allergen Reactions   Codeine Itching, Nausea And Vomiting and Other (See Comments)   Demeclocycline Rash   Demerol [Meperidine] Itching and Nausea And Vomiting   Hydrocodone Itching and Nausea And Vomiting   Other Other (See Comments)   Oxycodone Itching and Nausea And Vomiting   Pentazocine Itching, Nausea And Vomiting and Other (See Comments)   Tetracyclines & Related Rash   Coal Tar Extract Other (See Comments)   Hydrocodone-Acetaminophen Other (See Comments)   Salicylic Acid Other (See Comments)   Tetracycline Hcl Other (See Comments)   Fentanyl Itching, Nausea And Vomiting and Other (See Comments)    D/W surgical nurse on 7/23 and pt reportedly tolerated dose during surgery.  Dose was given by MD.  Pt reported that she only gets itching and did not object to receiving med    Review of Systems: Negative except as noted in the HPI.   Objective:  Noely Normandin is a pleasant 82 y.o. female WD, WN in NAD. AAO x 3.  Vascular Examination: CFT <3 seconds b/l. DP/PT pulses faintly palpable b/l. Skin temperature gradient warm to warm b/l. No pain with calf compression. No ischemia or gangrene. No cyanosis or clubbing noted b/l. Pedal hair absent. Trace edema noted right foot and right ankle.   Neurological Examination: Sensation grossly intact b/l with 10 gram monofilament. Vibratory sensation intact b/l.   Dermatological Examination: Pedal skin warm and supple b/l.   No  open wounds. No interdigital macerations.  Toenails 1-5 b/l thick, discolored, elongated with subungual debris and pain on dorsal palpation.    No hyperkeratotic nor porokeratotic lesions present on today's visit.  Musculoskeletal Examination: Normal muscle strength 5/5 to all lower extremity muscle groups bilaterally. 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 No pain, crepitus or joint limitation noted with ROM b/l LE.  Patient ambulates independently without assistive aids.  Radiographs: None  Last A1c:      Latest Ref Rng & Units 09/09/2022   12:04 AM  Hemoglobin A1C  Hemoglobin-A1c 4.8 - 5.6 % 7.2    Assessment:   1. Pain due to onychomycosis of toenails of both feet   2. Diabetic polyneuropathy associated with type 2 diabetes mellitus (HCC)    Plan:  -Patient was evaluated today. All questions/concerns addressed on today's visit. -No new findings. No new orders. -Continue supportive shoe gear daily. -Mycotic toenails 1-5 bilaterally were debrided in length and girth with sterile nail nippers and dremel without incident. -Patient/POA to call should there be question/concern in the interim.  Return in about 3 months (around 04/01/2023).  Freddie Breech, DPM      [place] LOCATION: [address]  [place], [place] [address]                   Office [phone]   [facility] LOCATION: [address] [place], [place] [address] Office [phone]

## 2022-12-31 NOTE — Progress Notes (Signed)
Remote pacemaker transmission.   

## 2023-01-07 ENCOUNTER — Other Ambulatory Visit: Payer: Self-pay | Admitting: Internal Medicine

## 2023-01-07 DIAGNOSIS — Z1231 Encounter for screening mammogram for malignant neoplasm of breast: Secondary | ICD-10-CM

## 2023-01-21 ENCOUNTER — Other Ambulatory Visit: Payer: Self-pay | Admitting: Student

## 2023-01-21 ENCOUNTER — Ambulatory Visit
Admission: RE | Admit: 2023-01-21 | Discharge: 2023-01-21 | Disposition: A | Discharge status: Home / Self Care | Payer: Medicare PPO | Source: Ambulatory Visit | Attending: Student | Admitting: Student

## 2023-01-21 DIAGNOSIS — R059 Cough, unspecified: Secondary | ICD-10-CM

## 2023-01-28 ENCOUNTER — Other Ambulatory Visit: Payer: Self-pay

## 2023-01-28 ENCOUNTER — Inpatient Hospital Stay
Admission: EM | Admit: 2023-01-28 | Discharge: 2023-01-31 | DRG: 377 | Disposition: A | Discharge status: Home / Self Care | Payer: Medicare PPO | Attending: Internal Medicine | Admitting: Internal Medicine

## 2023-01-28 ENCOUNTER — Inpatient Hospital Stay: Payer: Medicare PPO

## 2023-01-28 DIAGNOSIS — E785 Hyperlipidemia, unspecified: Secondary | ICD-10-CM | POA: Diagnosis present

## 2023-01-28 DIAGNOSIS — K31811 Angiodysplasia of stomach and duodenum with bleeding: Secondary | ICD-10-CM | POA: Diagnosis present

## 2023-01-28 DIAGNOSIS — K766 Portal hypertension: Secondary | ICD-10-CM | POA: Diagnosis present

## 2023-01-28 DIAGNOSIS — E119 Type 2 diabetes mellitus without complications: Secondary | ICD-10-CM | POA: Diagnosis present

## 2023-01-28 DIAGNOSIS — Z1152 Encounter for screening for COVID-19: Secondary | ICD-10-CM

## 2023-01-28 DIAGNOSIS — Z8249 Family history of ischemic heart disease and other diseases of the circulatory system: Secondary | ICD-10-CM | POA: Diagnosis not present

## 2023-01-28 DIAGNOSIS — K7460 Unspecified cirrhosis of liver: Secondary | ICD-10-CM | POA: Diagnosis present

## 2023-01-28 DIAGNOSIS — K31819 Angiodysplasia of stomach and duodenum without bleeding: Secondary | ICD-10-CM | POA: Diagnosis not present

## 2023-01-28 DIAGNOSIS — R112 Nausea with vomiting, unspecified: Secondary | ICD-10-CM | POA: Diagnosis not present

## 2023-01-28 DIAGNOSIS — Z79890 Hormone replacement therapy (postmenopausal): Secondary | ICD-10-CM | POA: Diagnosis not present

## 2023-01-28 DIAGNOSIS — J189 Pneumonia, unspecified organism: Secondary | ICD-10-CM | POA: Diagnosis present

## 2023-01-28 DIAGNOSIS — Z950 Presence of cardiac pacemaker: Secondary | ICD-10-CM

## 2023-01-28 DIAGNOSIS — K760 Fatty (change of) liver, not elsewhere classified: Secondary | ICD-10-CM | POA: Insufficient documentation

## 2023-01-28 DIAGNOSIS — A419 Sepsis, unspecified organism: Secondary | ICD-10-CM | POA: Diagnosis not present

## 2023-01-28 DIAGNOSIS — Z96652 Presence of left artificial knee joint: Secondary | ICD-10-CM | POA: Diagnosis present

## 2023-01-28 DIAGNOSIS — E876 Hypokalemia: Secondary | ICD-10-CM

## 2023-01-28 DIAGNOSIS — Z961 Presence of intraocular lens: Secondary | ICD-10-CM | POA: Diagnosis present

## 2023-01-28 DIAGNOSIS — Z7985 Long-term (current) use of injectable non-insulin antidiabetic drugs: Secondary | ICD-10-CM

## 2023-01-28 DIAGNOSIS — E039 Hypothyroidism, unspecified: Secondary | ICD-10-CM | POA: Diagnosis present

## 2023-01-28 DIAGNOSIS — Z79899 Other long term (current) drug therapy: Secondary | ICD-10-CM | POA: Diagnosis not present

## 2023-01-28 DIAGNOSIS — K921 Melena: Secondary | ICD-10-CM | POA: Diagnosis not present

## 2023-01-28 DIAGNOSIS — E871 Hypo-osmolality and hyponatremia: Secondary | ICD-10-CM | POA: Diagnosis present

## 2023-01-28 DIAGNOSIS — Z9012 Acquired absence of left breast and nipple: Secondary | ICD-10-CM

## 2023-01-28 DIAGNOSIS — Z794 Long term (current) use of insulin: Secondary | ICD-10-CM | POA: Diagnosis not present

## 2023-01-28 DIAGNOSIS — Z90722 Acquired absence of ovaries, bilateral: Secondary | ICD-10-CM

## 2023-01-28 DIAGNOSIS — D61818 Other pancytopenia: Secondary | ICD-10-CM | POA: Diagnosis present

## 2023-01-28 DIAGNOSIS — K219 Gastro-esophageal reflux disease without esophagitis: Secondary | ICD-10-CM | POA: Insufficient documentation

## 2023-01-28 DIAGNOSIS — T38995A Adverse effect of other hormone antagonists, initial encounter: Secondary | ICD-10-CM | POA: Diagnosis not present

## 2023-01-28 DIAGNOSIS — K922 Gastrointestinal hemorrhage, unspecified: Principal | ICD-10-CM | POA: Diagnosis present

## 2023-01-28 DIAGNOSIS — Z881 Allergy status to other antibiotic agents status: Secondary | ICD-10-CM

## 2023-01-28 DIAGNOSIS — Z853 Personal history of malignant neoplasm of breast: Secondary | ICD-10-CM

## 2023-01-28 DIAGNOSIS — G4733 Obstructive sleep apnea (adult) (pediatric): Secondary | ICD-10-CM | POA: Diagnosis present

## 2023-01-28 DIAGNOSIS — Z9079 Acquired absence of other genital organ(s): Secondary | ICD-10-CM

## 2023-01-28 DIAGNOSIS — I10 Essential (primary) hypertension: Secondary | ICD-10-CM | POA: Diagnosis present

## 2023-01-28 DIAGNOSIS — I2510 Atherosclerotic heart disease of native coronary artery without angina pectoris: Secondary | ICD-10-CM | POA: Diagnosis present

## 2023-01-28 DIAGNOSIS — K3189 Other diseases of stomach and duodenum: Secondary | ICD-10-CM | POA: Diagnosis present

## 2023-01-28 DIAGNOSIS — Z90710 Acquired absence of both cervix and uterus: Secondary | ICD-10-CM

## 2023-01-28 DIAGNOSIS — Z803 Family history of malignant neoplasm of breast: Secondary | ICD-10-CM

## 2023-01-28 DIAGNOSIS — E878 Other disorders of electrolyte and fluid balance, not elsewhere classified: Secondary | ICD-10-CM | POA: Diagnosis present

## 2023-01-28 DIAGNOSIS — Z923 Personal history of irradiation: Secondary | ICD-10-CM

## 2023-01-28 DIAGNOSIS — K2289 Other specified disease of esophagus: Secondary | ICD-10-CM | POA: Diagnosis not present

## 2023-01-28 DIAGNOSIS — Z888 Allergy status to other drugs, medicaments and biological substances status: Secondary | ICD-10-CM

## 2023-01-28 DIAGNOSIS — Z833 Family history of diabetes mellitus: Secondary | ICD-10-CM

## 2023-01-28 DIAGNOSIS — Z885 Allergy status to narcotic agent status: Secondary | ICD-10-CM

## 2023-01-28 DIAGNOSIS — I8500 Esophageal varices without bleeding: Secondary | ICD-10-CM | POA: Diagnosis present

## 2023-01-28 DIAGNOSIS — Z9049 Acquired absence of other specified parts of digestive tract: Secondary | ICD-10-CM

## 2023-01-28 DIAGNOSIS — Z96698 Presence of other orthopedic joint implants: Secondary | ICD-10-CM | POA: Diagnosis present

## 2023-01-28 LAB — COMPREHENSIVE METABOLIC PANEL
ALT: 14 U/L (ref 0–44)
AST: 30 U/L (ref 15–41)
Albumin: 3.3 g/dL — ABNORMAL LOW (ref 3.5–5.0)
Alkaline Phosphatase: 55 U/L (ref 38–126)
Anion gap: 11 (ref 5–15)
BUN: 19 mg/dL (ref 8–23)
CO2: 26 mmol/L (ref 22–32)
Calcium: 8.5 mg/dL — ABNORMAL LOW (ref 8.9–10.3)
Chloride: 96 mmol/L — ABNORMAL LOW (ref 98–111)
Creatinine, Ser: 0.78 mg/dL (ref 0.44–1.00)
GFR, Estimated: >60 mL/min (ref >60)
Glucose, Bld: 142 mg/dL — ABNORMAL HIGH (ref 70–99)
Potassium: 3.1 mmol/L — ABNORMAL LOW (ref 3.5–5.1)
Sodium: 133 mmol/L — ABNORMAL LOW (ref 135–145)
Total Bilirubin: 1.1 mg/dL (ref <1.2)
Total Protein: 7.9 g/dL (ref 6.5–8.1)

## 2023-01-28 LAB — URINALYSIS, ROUTINE W REFLEX MICROSCOPIC
Bacteria, UA: NONE SEEN
Bilirubin Urine: NEGATIVE
Glucose, UA: NEGATIVE mg/dL
Ketones, ur: NEGATIVE mg/dL
Nitrite: NEGATIVE
Protein, ur: NEGATIVE mg/dL
Specific Gravity, Urine: 1.013 (ref 1.005–1.030)
pH: 6 (ref 5.0–8.0)

## 2023-01-28 LAB — CBC
HCT: 24.9 % — ABNORMAL LOW (ref 36.0–46.0)
Hemoglobin: 8.6 g/dL — ABNORMAL LOW (ref 12.0–15.0)
MCH: 30.9 pg (ref 26.0–34.0)
MCHC: 34.5 g/dL (ref 30.0–36.0)
MCV: 89.6 fL (ref 80.0–100.0)
Platelets: 75 10*3/uL — ABNORMAL LOW (ref 150–400)
RBC: 2.78 MIL/uL — ABNORMAL LOW (ref 3.87–5.11)
RDW: 14.1 % (ref 11.5–15.5)
WBC: 2.6 10*3/uL — ABNORMAL LOW (ref 4.0–10.5)
nRBC: 0 % (ref 0.0–0.2)

## 2023-01-28 LAB — HEMOGLOBIN AND HEMATOCRIT, BLOOD
HCT: 25 % — ABNORMAL LOW (ref 36.0–46.0)
Hemoglobin: 8.6 g/dL — ABNORMAL LOW (ref 12.0–15.0)

## 2023-01-28 LAB — LIPASE, BLOOD: Lipase: 33 U/L (ref 11–51)

## 2023-01-28 LAB — TYPE AND SCREEN
ABO/RH(D): A POS
Antibody Screen: NEGATIVE

## 2023-01-28 MED ORDER — ONDANSETRON HCL 4 MG/2ML IJ SOLN
4.0000 mg | Freq: Four times a day (QID) | INTRAMUSCULAR | Status: DC | PRN
Start: 2023-01-28 — End: 2023-01-31
  Administered 2023-01-29 – 2023-01-30 (×3): 4 mg via INTRAVENOUS
  Filled 2023-01-28 (×3): qty 2

## 2023-01-28 MED ORDER — GABAPENTIN 300 MG PO CAPS
300.0000 mg | ORAL_CAPSULE | Freq: Every day | ORAL | Status: DC
Start: 2023-01-28 — End: 2023-01-31
  Administered 2023-01-29 – 2023-01-30 (×2): 300 mg via ORAL
  Filled 2023-01-28 (×2): qty 1

## 2023-01-28 MED ORDER — LEVOTHYROXINE SODIUM 50 MCG PO TABS
75.0000 ug | ORAL_TABLET | Freq: Every day | ORAL | Status: DC
  Administered 2023-01-30 – 2023-01-31 (×2): 75 ug via ORAL
  Filled 2023-01-28 (×2): qty 2

## 2023-01-28 MED ORDER — FENOFIBRATE 160 MG PO TABS
160.0000 mg | ORAL_TABLET | Freq: Every day | ORAL | Status: DC
  Administered 2023-01-29 – 2023-01-30 (×2): 160 mg via ORAL
  Filled 2023-01-28 (×3): qty 1

## 2023-01-28 MED ORDER — TRAZODONE HCL 50 MG PO TABS
25.0000 mg | ORAL_TABLET | Freq: Every evening | ORAL | Status: DC | PRN
  Administered 2023-01-30: 25 mg via ORAL
  Filled 2023-01-28: qty 1

## 2023-01-28 MED ORDER — ONDANSETRON HCL 4 MG PO TABS: 4.0000 mg | ORAL_TABLET | Freq: Four times a day (QID) | ORAL | Status: DC | PRN

## 2023-01-28 MED ORDER — ACETAMINOPHEN 325 MG PO TABS
650.0000 mg | ORAL_TABLET | Freq: Four times a day (QID) | ORAL | Status: DC | PRN
  Filled 2023-01-28: qty 2

## 2023-01-28 MED ORDER — POTASSIUM CHLORIDE CRYS ER 20 MEQ PO TBCR
20.0000 meq | EXTENDED_RELEASE_TABLET | Freq: Two times a day (BID) | ORAL | Status: DC
Start: 2023-01-28 — End: 2023-01-29
  Filled 2023-01-28: qty 1

## 2023-01-28 MED ORDER — ONDANSETRON 4 MG PO TBDP
4.0000 mg | ORAL_TABLET | Freq: Once | ORAL | Status: AC
  Administered 2023-01-28: 4 mg via ORAL
  Filled 2023-01-28: qty 1

## 2023-01-28 MED ORDER — INSULIN DEGLUDEC 200 UNIT/ML ~~LOC~~ SOPN: 100.0000 mL | PEN_INJECTOR | Freq: Every day | SUBCUTANEOUS | Status: DC

## 2023-01-28 MED ORDER — ACETAMINOPHEN 650 MG RE SUPP: 650.0000 mg | Freq: Four times a day (QID) | RECTAL | Status: DC | PRN

## 2023-01-28 MED ORDER — PANTOPRAZOLE SODIUM 40 MG IV SOLR
40.0000 mg | Freq: Once | INTRAVENOUS | Status: AC
  Administered 2023-01-28: 40 mg via INTRAVENOUS
  Filled 2023-01-28: qty 10

## 2023-01-28 MED ORDER — NADOLOL 20 MG PO TABS
20.0000 mg | ORAL_TABLET | Freq: Every day | ORAL | Status: DC
Start: 2023-01-29 — End: 2023-01-31
  Administered 2023-01-29 – 2023-01-31 (×2): 20 mg via ORAL
  Filled 2023-01-28 (×3): qty 1

## 2023-01-28 MED ORDER — POTASSIUM CHLORIDE IN NACL 20-0.9 MEQ/L-% IV SOLN
INTRAVENOUS | Status: DC
  Administered 2023-01-29: 20 mL/h via INTRAVENOUS
  Filled 2023-01-28 (×2): qty 1000

## 2023-01-28 MED ORDER — SODIUM CHLORIDE 0.9 % IV SOLN
1.0000 g | Freq: Once | INTRAVENOUS | Status: AC
  Administered 2023-01-28: 1 g via INTRAVENOUS
  Filled 2023-01-28: qty 10

## 2023-01-28 MED ORDER — SIMVASTATIN 20 MG PO TABS
20.0000 mg | ORAL_TABLET | Freq: Every day | ORAL | Status: DC
Start: 2023-01-28 — End: 2023-01-31
  Administered 2023-01-29 – 2023-01-30 (×2): 20 mg via ORAL
  Filled 2023-01-28 (×2): qty 1

## 2023-01-28 MED ORDER — SODIUM CHLORIDE 0.9 % IV SOLN
50.0000 ug/h | INTRAVENOUS | Status: DC
  Administered 2023-01-28 – 2023-01-30 (×4): 50 ug/h via INTRAVENOUS
  Filled 2023-01-28 (×6): qty 1

## 2023-01-28 MED ORDER — PANTOPRAZOLE INFUSION (NEW) - SIMPLE MED: 8.0000 mg/h | INTRAVENOUS | Status: DC

## 2023-01-28 MED ORDER — SPIRONOLACTONE 12.5 MG HALF TABLET
12.5000 mg | ORAL_TABLET | Freq: Every day | ORAL | Status: DC
Start: 2023-01-29 — End: 2023-01-31
  Administered 2023-01-29 – 2023-01-31 (×2): 12.5 mg via ORAL
  Filled 2023-01-28 (×3): qty 1

## 2023-01-28 MED ORDER — OCTREOTIDE LOAD VIA INFUSION
50.0000 ug | Freq: Once | INTRAVENOUS | Status: AC
  Administered 2023-01-28: 50 ug via INTRAVENOUS
  Filled 2023-01-28: qty 25

## 2023-01-28 NOTE — ED Provider Notes (Signed)
[facility] Provider Note   Event Date/Time   First MD Initiated Contact with Patient 01/28/23 1955     (approximate) History  Rectal Bleeding  HPI Annette Foster is a 82 y.o. female with a past medical history of nonalcoholic cirrhosis with esophageal varices who presents for dark tarry stools over the last 3 days that she states has been similar to previous GI bleeds that she has had in the past.  Patient denies any NSAID use or blood thinners. ROS: Patient currently denies any vision changes, tinnitus, difficulty speaking, facial droop, sore throat, chest pain, shortness of breath, abdominal pain, nausea/vomiting/diarrhea, dysuria, or weakness/numbness/paresthesias in any extremity   Physical Exam  Triage Vital Signs: ED Triage Vitals  Encounter Vitals Group     BP 01/28/23 1911 102/63     Systolic BP Percentile --      Diastolic BP Percentile --      Pulse Rate 01/28/23 1911 68     Resp 01/28/23 1911 18     Temp 01/28/23 1911 98.7 F (37.1 C)     Temp Source 01/28/23 1911 Oral     SpO2 01/28/23 1911 100 %     Weight 01/28/23 1912 139 lb (63 kg)     Height 01/28/23 1912 5\' 3"  (1.6 m)     Head Circumference --      Peak Flow --      Pain Score 01/28/23 1912 8     Pain Loc --      Pain Education --      Exclude from Growth Chart --    Most recent vital signs: Vitals:   01/28/23 1911 01/28/23 2011  BP: 102/63   Pulse: 68   Resp: 18   Temp: 98.7 F (37.1 C)   SpO2: 100% 100%   General: Awake, oriented x4. CV:  Good peripheral perfusion.  Resp:  Normal effort.  Abd:  No distention.  Other:  Elderly well-developed, well-nourished Caucasian female resting comfortably in no acute distress ED Results / Procedures / Treatments  Labs (all labs ordered are listed, but only abnormal results are displayed) Labs Reviewed  COMPREHENSIVE METABOLIC PANEL - Abnormal; Notable for the following components:      Result Value   Sodium 133 (*)     Potassium 3.1 (*)    Chloride 96 (*)    Glucose, Bld 142 (*)    Calcium 8.5 (*)    Albumin 3.3 (*)    All other components within normal limits  CBC - Abnormal; Notable for the following components:   WBC 2.6 (*)    RBC 2.78 (*)    Hemoglobin 8.6 (*)    HCT 24.9 (*)    Platelets 75 (*)    All other components within normal limits  LIPASE, BLOOD  URINALYSIS, ROUTINE W REFLEX MICROSCOPIC  TYPE AND SCREEN  TYPE AND SCREEN   PROCEDURES: Critical Care performed: No .1-3 Lead EKG Interpretation  Performed by: Merwyn Katos, MD Authorized by: Merwyn Katos, MD     Interpretation: normal     ECG rate:  71   ECG rate assessment: normal     Rhythm: sinus rhythm     Ectopy: none     Conduction: normal    MEDICATIONS ORDERED IN ED: Medications  ondansetron (ZOFRAN-ODT) disintegrating tablet 4 mg (4 mg Oral Given 01/28/23 2002)   IMPRESSION / MDM / ASSESSMENT AND PLAN / ED COURSE  I reviewed the triage vital signs and  the nursing notes.                             The patient is on the cardiac monitor to evaluate for evidence of arrhythmia and/or significant heart rate changes. Patient's presentation is most consistent with acute presentation with potential threat to life or bodily function. + abdominal pain + black stool per rectum Given history and exam patient's presentation most consistent with upper GI bleed possibly secondary to peptic ulcer disease or variceal bleeding. I have low suspicion for aortoenteric fistula, ENT bleeding mimic, Boerhaave's, Pulmonary bleeding mimic.  Workup: CBC, BMP, LFTs, Lipase, PT/INR, Type and Screen  Interventions: Analgesia and antiemetic medications PRN Protonix 40mg  IVP Octreotide push -> 50 mcg/hr drip Ceftriaxone 1 gram IV PRBC transfusion  Findings: Hb: 8.6  Disposition: Admit for close monitoring.   FINAL CLINICAL IMPRESSION(S) / ED DIAGNOSES   Final diagnoses:  None   Rx / DC Orders   ED Discharge Orders      None      Note:  This document was prepared using Dragon voice recognition software and may include unintentional dictation errors.   Merwyn Katos, MD 01/28/23 [phone]

## 2023-01-28 NOTE — ED Provider Triage Note (Signed)
Emergency Medicine Provider Triage Evaluation Note  Annette Foster , a 82 y.o. female  was evaluated in triage.  Pt complains of bright red rectal bleeding, history of esophageal varices. Reports rectal pain. Has been bleeding for about an hour.  Review of Systems  Positive: Rectal pain, rectal bleeding, dizziness, light headed Negative:   Physical Exam  BP 102/63 (BP Location: Left Arm)   Pulse 68   Temp 98.7 F (37.1 C) (Oral)   Resp 18   Ht 5\' 3"  (1.6 m)   Wt 63 kg   LMP  (LMP Unknown)   SpO2 100%   BMI 24.62 kg/m  Gen:   Awake, no distress   Resp:  Normal effort  MSK:   Moves extremities without difficulty  Other:    Medical Decision Making  Medically screening exam initiated at 7:15 PM.  Appropriate orders placed.  Annette Foster was informed that the remainder of the evaluation will be completed by another provider, this initial triage assessment does not replace that evaluation, and the importance of remaining in the ED until their evaluation is complete.     Cameron Ali, PA-C 01/28/23 [phone]

## 2023-01-28 NOTE — ED Triage Notes (Signed)
Pt in with co rectal bleeding due to hx of esophageal varices per patient. States started 1 hr ago, pt pale in triage. Pt co rectal pain.

## 2023-01-28 NOTE — H&P (Signed)
[place]   PATIENT NAME: Annette Foster    MR#:  [id number]  DATE OF BIRTH:  1940/06/27  DATE OF ADMISSION:  01/28/2023  PRIMARY CARE PHYSICIAN: Jaclyn Shaggy, MD   Patient is coming from: Home  REQUESTING/REFERRING PHYSICIAN: Ginette Pitman, MD  CHIEF COMPLAINT:   Chief Complaint  Patient presents with   Rectal Bleeding    HISTORY OF PRESENT ILLNESS:  Annette Foster is a 82 y.o. female with medical history significant for type 2 diabetes mellitus, CHF, GERD, hypertension, dyslipidemia, hypothyroidism, IBS and OSA on CPAP, who presented to the emergency room with acute onset of melanotic stools yesterday and bright red bleeding per rectum today.  She denied any abdominal pain or nausea or vomiting or heartburn.  No chest pain or palpitations.  She was initially having headache and palpitations and some chest discomfort going to her arm.  She is currently on Augmentin and doxycycline for pneumonia.  She started Augmentin on Friday and doxycycline a week ago.  She continues to have cough productive of yellowish sputum with mild dyspnea with occasional wheezing.  No dysuria, oliguria or hematuria or flank pain.  No other bleeding diathesis.  ED Course: When she came to the ER vital signs were within normal.  Labs revealed mild hyponatremia 133 and hypochloremia 96 with hypokalemia of 3.1 and a blood Kos of 142 and, calcium 8.5 with albumin 3.3 with otherwise unremarkable CMP.  CBC showed mild leukopenia of 2.6 and anemia with hemoglobin 8.6 and hematocrit 24.9 and thrombocytopenia of 75.  Blood group is A+ with negative antibody screen.  UA came back with 21-50 RBCs with 0-5 WBCs and large leukocytes with negative nitrites and specific gravity 1013. EKG as reviewed by me : None Imaging: Portable chest x-ray showed slightly increased airspace opacities in the right middle lobe compared to 12/10 with questionable new opacities in the left mid and lower lung with recommendation for  follow-up in 6 to 8 weeks.  The patient was given IV Protonix bolus and IV octreotide bolus and a drip as well as 4 g of IV Zofran.  She will be admitted to a progressive unit bed for further evaluation and management. PAST MEDICAL HISTORY:   Past Medical History:  Diagnosis Date   Abdominal pain    Allergy    Cancer (HCC) 2017   breast cancer- Left   Cataract    CHF (congestive heart failure) (HCC)    Collagenous colitis    Coronary artery disease    Diabetes mellitus without complication (HCC)    Diarrhea    Diverticulosis    Dysrhythmia    Fatty liver disease, nonalcoholic    Fibrocystic breast    GERD (gastroesophageal reflux disease)    Heart murmur    Hyperlipidemia    Hypertension    Hypothyroidism    IBS (irritable bowel syndrome)    IDA (iron deficiency anemia)    Liver cirrhosis (HCC)    Personal history of radiation therapy 2017   LEFT BREAST CA   PONV (postoperative nausea and vomiting)    Presence of permanent cardiac pacemaker    Sleep apnea    C-Pap    PAST SURGICAL HISTORY:   Past Surgical History:  Procedure Laterality Date   ABDOMINAL HYSTERECTOMY     [place] bso   ABDOMINAL SURGERY     APPENDECTOMY     AV NODE ABLATION N/A 05/17/2021   Procedure: AV NODE ABLATION;  Surgeon: Lanier Prude,  MD;  [facility]: [facility];  Service: Cardiovascular;  Laterality: N/A;   BREAST BIOPSY Bilateral 2016   negative   BREAST 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/2015   DCIS, papillary carcinoma in situ   BREAST BIOPSY Left 05/27/2016   BENIGN MAMMARY EPITHELIUM   BREAST BIOPSY Left 11/20/2017   affirm bx x clip BENIGN MAMMARY EPITHELIUM CONSISTENT WITH RAD THERAPY   BREAST EXCISIONAL BIOPSY Right    NEG 1980's   BREAST LUMPECTOMY Left 10/17/2015   DCIS and papillary carcinoma insitu, clear margins   CARDIAC SURGERY     has replacement valve   CATARACT EXTRACTION Right    CATARACT EXTRACTION W/PHACO Left 01/16/2021   Procedure: CATARACT EXTRACTION PHACO AND  INTRAOCULAR LENS PLACEMENT (IOC) LEFT DIABETIC 8.46 00:59.8;  Surgeon: Galen Manila, MD;  Location: [facility];  Service: Ophthalmology;  Laterality: Left;   CHOLECYSTECTOMY     COLONOSCOPY WITH PROPOFOL N/A 03/11/2016   Procedure: COLONOSCOPY WITH PROPOFOL;  Surgeon: Scot Jun, MD;  Location: [facility] ENDOSCOPY;  Service: Endoscopy;  Laterality: N/A;   COLONOSCOPY WITH PROPOFOL N/A 02/18/2020   Procedure: COLONOSCOPY WITH PROPOFOL;  Surgeon: Midge Minium, MD;  Location: [facility] ENDOSCOPY;  Service: Endoscopy;  Laterality: N/A;   COLONOSCOPY WITH PROPOFOL N/A 09/10/2022   Procedure: COLONOSCOPY WITH PROPOFOL;  Surgeon: Wyline Mood, MD;  Location: [facility] ENDOSCOPY;  Service: Gastroenterology;  Laterality: N/A;   ESOPHAGEAL BANDING  09/10/2022   Procedure: ESOPHAGEAL BANDING;  Surgeon: Wyline Mood, MD;  Location: [facility] ENDOSCOPY;  Service: Gastroenterology;;   ESOPHAGEAL BANDING  10/17/2022   Procedure: ESOPHAGEAL BANDING;  Surgeon: Midge Minium, MD;  Location: [facility] ENDOSCOPY;  Service: Endoscopy;;   ESOPHAGEAL BANDING  12/24/2022   Procedure: ESOPHAGEAL BANDING;  Surgeon: Midge Minium, MD;  Location: [facility] ENDOSCOPY;  Service: Endoscopy;;   ESOPHAGOGASTRODUODENOSCOPY (EGD) WITH PROPOFOL N/A 03/11/2016   Procedure: ESOPHAGOGASTRODUODENOSCOPY (EGD) WITH PROPOFOL;  Surgeon: Scot Jun, MD;  Location: [facility] ENDOSCOPY;  Service: Endoscopy;  Laterality: N/A;   ESOPHAGOGASTRODUODENOSCOPY (EGD) WITH PROPOFOL N/A 02/18/2020   Procedure: ESOPHAGOGASTRODUODENOSCOPY (EGD) WITH PROPOFOL;  Surgeon: Midge Minium, MD;  Location: [facility] ENDOSCOPY;  Service: Endoscopy;  Laterality: N/A;   ESOPHAGOGASTRODUODENOSCOPY (EGD) WITH PROPOFOL N/A 04/25/2020   Procedure: ESOPHAGOGASTRODUODENOSCOPY (EGD) WITH PROPOFOL;  Surgeon: Midge Minium, MD;  Location: [facility] ENDOSCOPY;  Service: Endoscopy;  Laterality: N/A;   ESOPHAGOGASTRODUODENOSCOPY (EGD) WITH PROPOFOL N/A 05/23/2020   Procedure: ESOPHAGOGASTRODUODENOSCOPY  (EGD) WITH PROPOFOL;  Surgeon: Midge Minium, MD;  Location: [facility] ENDOSCOPY;  Service: Endoscopy;  Laterality: N/A;   ESOPHAGOGASTRODUODENOSCOPY (EGD) WITH PROPOFOL N/A 09/10/2022   Procedure: ESOPHAGOGASTRODUODENOSCOPY (EGD) WITH PROPOFOL;  Surgeon: Wyline Mood, MD;  Location: [facility] ENDOSCOPY;  Service: Gastroenterology;  Laterality: N/A;   ESOPHAGOGASTRODUODENOSCOPY (EGD) WITH PROPOFOL N/A 10/17/2022   Procedure: ESOPHAGOGASTRODUODENOSCOPY (EGD) WITH PROPOFOL;  Surgeon: Midge Minium, MD;  Location: [facility] ENDOSCOPY;  Service: Endoscopy;  Laterality: N/A;   ESOPHAGOGASTRODUODENOSCOPY (EGD) WITH PROPOFOL N/A 12/24/2022   Procedure: ESOPHAGOGASTRODUODENOSCOPY (EGD) WITH PROPOFOL;  Surgeon: Midge Minium, MD;  Location: [facility] ENDOSCOPY;  Service: Endoscopy;  Laterality: N/A;   EUS N/A 10/04/2021   Procedure: LOWER ENDOSCOPIC ULTRASOUND (EUS);  Surgeon: Doren Custard, MD;  Location: [facility] ENDOSCOPY;  Service: Gastroenterology;  Laterality: N/A;  [facility]   EYE SURGERY     FINGER ARTHROSCOPY WITH CARPOMETACARPEL (CMC) ARTHROPLASTY Right 09/03/2018   Procedure: CARPOMETACARPEL [facility]) ARTHROPLASTY RIGHT THUMB;  Surgeon: Kennedy Bucker, MD;  Location: [facility] ORS;  Service: Orthopedics;  Laterality: Right;   FLEXIBLE SIGMOIDOSCOPY N/A 09/14/2021   Procedure: FLEXIBLE SIGMOIDOSCOPY;  Surgeon: Midge Minium, MD;  Location: [facility] ENDOSCOPY;  Service: Endoscopy;  Laterality: N/A;  No anesthesia   GANGLION CYST EXCISION Right 09/03/2018   Procedure: REMOVAL GANGLION OF WRIST;  Surgeon: Kennedy Bucker, MD;  Location: [facility] ORS;  Service: Orthopedics;  Laterality: Right;   HARDWARE REMOVAL Right 09/03/2018   Procedure: HARDWARE REMOVAL RIGHT THUMB;  Surgeon: Kennedy Bucker, MD;  Location: [facility] ORS;  Service: Orthopedics;  Laterality: Right;  staple removed   HEMOSTASIS CLIP PLACEMENT  09/10/2022   Procedure: HEMOSTASIS CLIP PLACEMENT;  Surgeon: Wyline Mood, MD;  Location: [facility] ENDOSCOPY;  Service: Gastroenterology;;    HEMOSTASIS CONTROL  09/10/2022   Procedure: HEMOSTASIS CONTROL;  Surgeon: Wyline Mood, MD;  Location: [facility] ENDOSCOPY;  Service: Gastroenterology;;   JOINT REPLACEMENT Left    TKR   left sinusplasty      MASTECTOMY, PARTIAL Left 10/17/2015   Procedure: MASTECTOMY PARTIAL REVISION;  Surgeon: Nadeen Landau, MD;  Location: [facility] ORS;  Service: General;  Laterality: Left;   PACEMAKER IMPLANT N/A 03/23/2021   Procedure: PACEMAKER IMPLANT;  Surgeon: Lanier Prude, MD;  Location: [facility];  Service: Cardiovascular;  Laterality: N/A;   PARTIAL MASTECTOMY WITH NEEDLE LOCALIZATION Left 09/29/2015   Procedure: PARTIAL MASTECTOMY WITH NEEDLE LOCALIZATION;  Surgeon: Nadeen Landau, MD;  Location: [facility] ORS;  Service: General;  Laterality: Left;   POLYPECTOMY  09/10/2022   Procedure: POLYPECTOMY;  Surgeon: Wyline Mood, MD;  Location: [facility] ENDOSCOPY;  Service: Gastroenterology;;   RECTAL EXAM UNDER ANESTHESIA N/A 10/05/2021   Procedure: RECTAL EXAM UNDER ANESTHESIA, ASPIRATION OF RECTAL CYST;  Surgeon: Carolan Shiver, MD;  Location: [facility] ORS;  Service: General;  Laterality: N/A;   TOTAL ABDOMINAL HYSTERECTOMY W/ BILATERAL SALPINGOOPHORECTOMY      SOCIAL HISTORY:   Social History   Tobacco Use   Smoking status: Never   Smokeless tobacco: Never  Substance Use Topics   Alcohol use: Never    FAMILY HISTORY:   Family History  Problem Relation Age of Onset   Breast cancer Paternal Grandmother    Colon cancer Father    Diabetes Sister    Diabetes Brother    Heart disease Brother    Prostate cancer Brother    Colon cancer Maternal Uncle    Prostate cancer Brother    Bladder Cancer Brother    Leukemia Mother        all   Ovarian cancer Neg Hx    Kidney cancer Neg Hx     DRUG ALLERGIES:   Allergies  Allergen Reactions   Codeine Itching, Nausea And Vomiting and Other (See Comments)   Demeclocycline Rash   Demerol [Meperidine] Itching and Nausea And Vomiting    Hydrocodone Itching and Nausea And Vomiting   Other Other (See Comments)   Oxycodone Itching and Nausea And Vomiting   Pentazocine Itching, Nausea And Vomiting and Other (See Comments)   Tetracyclines & Related Rash   Coal Tar Extract Other (See Comments)   Hydrocodone-Acetaminophen Other (See Comments)   Salicylic Acid Other (See Comments)   Tetracycline Hcl Other (See Comments)   Fentanyl Itching, Nausea And Vomiting and Other (See Comments)    D/W surgical nurse on 7/23 and pt reportedly tolerated dose during surgery.  Dose was given by MD.  Pt reported that she only gets itching and did not object to receiving med    REVIEW OF SYSTEMS:   ROS As per history of present illness. All pertinent systems were reviewed above. Constitutional, HEENT, cardiovascular, respiratory, GI,  GU, musculoskeletal, neuro, psychiatric, endocrine, integumentary and hematologic systems were reviewed and are otherwise negative/unremarkable except for positive findings mentioned above in the HPI.   MEDICATIONS AT HOME:   Prior to Admission medications   Medication Sig Start Date End Date Taking? Authorizing Provider  amoxicillin-clavulanate (AUGMENTIN) 875-125 MG tablet Take 1 tablet by mouth 2 (two) times daily. 01/24/23  Yes [provider]  Azelastine HCl 137 MCG/SPRAY SOLN INHALE 1-2 PUFFS IN EACH NOSTRIL NASALLY ONCE A DAY 30 DAY(S)   Yes [provider]  benzonatate (TESSALON) 100 MG capsule Take 100 mg by mouth 3 (three) times daily as needed. 10/28/22  Yes [provider]  cefdinir (OMNICEF) 300 MG capsule Take 300 mg by mouth 2 (two) times daily. 10/11/22  Yes [provider]  Cholecalciferol (VITAMIN D) 50 MCG (2000 UT) CAPS Take 2,000 Units by mouth daily.   Yes [provider]  clobetasol ointment (TEMOVATE) 0.05 % Apply 1 application topically as needed (vaginal irritation).   Yes [provider]  clotrimazole (LOTRIMIN) 1 % cream Apply 1  Application topically 2 (two) times daily as needed.   Yes [provider]  cyanocobalamin (,VITAMIN B-12,) 1000 MCG/ML injection 1,000 mcg every 30 (thirty) days. 04/23/19  Yes [provider]  docusate sodium (COLACE) 100 MG capsule Take 100 mg by mouth 3 (three) times daily as needed for moderate constipation. 01/10/20  Yes [provider]  doxycycline (VIBRAMYCIN) 100 MG capsule Take 100 mg by mouth 2 (two) times daily. 01/21/23  Yes [provider]  esomeprazole (NEXIUM) 40 MG capsule Take 40 mg by mouth 2 (two) times daily before a meal.  02/08/16  Yes [provider]  estradiol (ESTRACE) 0.1 MG/GM vaginal cream Place 1 Applicatorful vaginally daily as needed (irritation).   Yes [provider]  fenofibrate 160 MG tablet Take 160 mg by mouth at bedtime.   Yes [provider]  gabapentin (NEURONTIN) 300 MG capsule Take 300 mg by mouth at bedtime.  09/07/16 10/25/36 Yes [provider]  hydrOXYzine (ATARAX/VISTARIL) 25 MG tablet Take 25 mg by mouth every 6 (six) hours as needed for anxiety, itching, nausea or vomiting.   Yes [provider]  levothyroxine (SYNTHROID, LEVOTHROID) 75 MCG tablet Take 75 mcg by mouth daily before breakfast.    Yes [provider]  magnesium oxide (MAG-OX) 400 MG tablet Take 400 mg by mouth 2 (two) times daily.    Yes [provider]  metoprolol tartrate (LOPRESSOR) 50 MG tablet TAKE 1 TABLET 2 HR PRIOR TO CARDIAC PROCEDURE 10/04/22  Yes Furth, Cadence H, PA-C  nadolol (CORGARD) 20 MG tablet Take 1 tablet (20 mg total) by mouth daily. 12/20/22  Yes End, Cristal Deer, MD  NONFORMULARY OR COMPOUNDED ITEM Nifedipine 0.3% plus lidocaine 2% cream apply to rectum BID and after every BM.   Yes [provider]  NOVOLOG FLEXPEN 100 UNIT/ML FlexPen Inject 48-68 Units into the skin 3 (three) times daily with meals. Based on sliding scale 08/14/19  Yes [provider]   potassium chloride SA (K-DUR,KLOR-CON) 20 MEQ tablet Take 20 mEq by mouth 2 (two) times daily.    Yes [provider]  saccharomyces boulardii (FLORASTOR) 250 MG capsule Take 250 mg by mouth 2 (two) times daily.    Yes [provider]  simvastatin (ZOCOR) 20 MG tablet Take 20 mg by mouth at bedtime.   Yes [provider]  spironolactone (ALDACTONE) 25 MG tablet TAKE 1/2 TABLET BY MOUTH DAILY  05/03/22  Yes End, Cristal Deer, MD  torsemide (DEMADEX) 20 MG tablet TAKE 1 TABLET BY MOUTH EVERY DAY 03/02/21  Yes End, Cristal Deer, MD  TRESIBA FLEXTOUCH 200 UNIT/ML FlexTouch Pen 194 Unit(s) SUB-Q Daily   Yes [provider]  valACYclovir (VALTREX) 500 MG tablet Take 500 mg by mouth 2 (two) times daily as needed (fever blisters). 01/06/17  Yes [provider]  cyclobenzaprine (FLEXERIL) 5 MG tablet Take 1 tablet by mouth 2 (two) times daily. Patient not taking: Reported on 10/17/2022    [provider]  dicyclomine (BENTYL) 10 MG capsule Take 10 mg by mouth in the morning, at noon, and at bedtime. 05/16/19   [provider]  EPINEPHrine 0.3 mg/0.3 mL IJ SOAJ injection Inject 0.3 mg into the muscle as needed for anaphylaxis. Patient not taking: Reported on 11/13/2022 05/18/19   [provider]  levocetirizine (XYZAL) 5 MG tablet Take 5 mg by mouth every evening.    [provider]  MOUNJARO 7.5 MG/0.5ML Pen Inject 12.5 mg into the skin once a week. 02/15/22   [provider]      VITAL SIGNS:  Blood pressure 102/63, pulse 68, temperature 98.7 F (37.1 C), temperature source Oral, resp. rate 18, height 5\' 3"  (1.6 m), weight 63 kg, SpO2 100%.  PHYSICAL EXAMINATION:  Physical Exam  GENERAL:  82 y.o.-year-old female patient lying in the bed with no acute distress.  EYES: Pupils equal, round, reactive to light and accommodation. No scleral icterus.  Positive pallor.  Extraocular muscles intact.  HEENT: Head atraumatic,  normocephalic. Oropharynx and nasopharynx clear.  NECK:  Supple, no jugular venous distention. No thyroid enlargement, no tenderness.  LUNGS: Diminished bibasal breath sounds with bibasal crackles.  No use of accessory muscles of respiration.  CARDIOVASCULAR: Regular rate and rhythm, S1, S2 normal. No murmurs, rubs, or gallops.  ABDOMEN: Soft, nondistended, nontender. Bowel sounds present. No organomegaly or mass.  EXTREMITIES: No pedal edema, cyanosis, or clubbing.  NEUROLOGIC: Cranial nerves II through XII are intact. Muscle strength 5/5 in all extremities. Sensation intact. Gait not checked.  PSYCHIATRIC: The patient is alert and oriented x 3.  Normal affect and good eye contact. SKIN: No obvious rash, lesion, or ulcer.   LABORATORY PANEL:   CBC Recent Labs  Lab 01/28/23 2109 01/28/23 2347  WBC 2.6*  --   HGB 8.6* 8.6*  HCT 24.9* 25.0*  PLT 75*  --    ------------------------------------------------------------------------------------------------------------------  Chemistries  Recent Labs  Lab 01/28/23 1916  NA 133*  K 3.1*  CL 96*  CO2 26  GLUCOSE 142*  BUN 19  CREATININE 0.78  CALCIUM 8.5*  AST 30  ALT 14  ALKPHOS 55  BILITOT 1.1   ------------------------------------------------------------------------------------------------------------------  Cardiac Enzymes No results for input(s): "TROPONINI" in the last 168 hours. ------------------------------------------------------------------------------------------------------------------  RADIOLOGY:  DG Chest 1 View Result Date: 01/29/2023 CLINICAL DATA:  Pneumonia EXAM: CHEST  1 VIEW COMPARISON:  01/21/2023 FINDINGS: Slightly increased airspace opacities in the right middle lobe compared to 01/21/2023. Question new opacities in the left mid and lower lung. No pleural effusion or pneumothorax. Stable cardiomediastinal silhouette. Aortic atherosclerotic calcification. Right chest wall pacemaker. IMPRESSION: Slightly  increased airspace opacities in the right middle lobe compared to 01/21/2023. Question new opacities in the left mid and lower lung. Follow-up in 6-8 weeks after treatment is recommended to ensure resolution. Electronically Signed   By: Minerva Fester M.D.   On: 01/29/2023 00:00      IMPRESSION AND PLAN:  Assessment  and Plan: * GI bleeding - The patient be admitted to a progressive unit bed. - We will follow serial hemoglobins and hematocrits. - We will continue IV Protonix drip and octreotide. - GI consult will be obtained. - I notified Dr. Mia Creek about the patient.  Sepsis due to pneumonia (HCC) - Sepsis manifested by leukopenia of 2.6 and tachypnea of 26 shortly upon admission. - The patient will be admitted to a medical telemetry bed. - Will continue antibiotic therapy with IV Rocephin and Zithromax. - Mucolytic therapy be provided as well as duo nebs q.i.d. and q.4 hours p.r.n. - We will follow blood cultures.   Hypokalemia The potassium will be replaced and magnesium level will be checked.  Hyponatremia - This is likely hypovolemic. - Will hydrate with IV normal saline with added potassium chloride and follow BMP.  Type 2 diabetes mellitus without complications (HCC) - The patient  will be placed on supplemental coverage with NovoLog. - We will place her her basal coverage at half  her home dose for now.  Dyslipidemia - We will continue fenofibrate.  GERD without esophagitis - We will continue PPI therapy.   DVT prophylaxis: SCDs.  Medical prophylaxis contraindicated due to GI bleeding. Advanced Care Planning:  Code Status: The patient is full code.  This was discussed with her and her family. Family Communication:  The plan of care was discussed in details with the patient (and family). I answered all questions. The patient agreed to proceed with the above mentioned plan. Further management will depend upon hospital course. Disposition Plan: Back to previous home  environment Consults called: GI All the records are reviewed and case discussed with ED provider.  Status is: Inpatient   At the time of the admission, it appears that the appropriate admission status for this patient is inpatient.  This is judged to be reasonable and necessary in order to provide the required intensity of service to ensure the patient's safety given the presenting symptoms, physical exam findings and initial radiographic and laboratory data in the context of comorbid conditions.  The patient requires inpatient status due to high intensity of service, high risk of further deterioration and high frequency of surveillance required.  I certify that at the time of admission, it is my clinical judgment that the patient will require inpatient hospital care extending more than 2 midnights.                            Dispo: The patient is from: Home              Anticipated d/c is to: Home              Patient currently is not medically stable to d/c.              Difficult to place patient: No  Hannah Beat M.D on 01/29/2023 at 2:22 AM  [facility]   From 7 PM-7 AM, contact night-coverage [email or web address]  CC: Primary care physician; Jaclyn Shaggy, MD

## 2023-01-29 ENCOUNTER — Encounter: Payer: Self-pay | Admitting: Family Medicine

## 2023-01-29 ENCOUNTER — Encounter
Admission: EM | Disposition: A | Discharge status: Home / Self Care | Payer: Self-pay | Source: Home / Self Care | Attending: Internal Medicine

## 2023-01-29 ENCOUNTER — Inpatient Hospital Stay: Payer: Medicare PPO | Admitting: General Practice

## 2023-01-29 DIAGNOSIS — E871 Hypo-osmolality and hyponatremia: Secondary | ICD-10-CM

## 2023-01-29 DIAGNOSIS — E876 Hypokalemia: Secondary | ICD-10-CM

## 2023-01-29 DIAGNOSIS — E785 Hyperlipidemia, unspecified: Secondary | ICD-10-CM | POA: Insufficient documentation

## 2023-01-29 DIAGNOSIS — A419 Sepsis, unspecified organism: Secondary | ICD-10-CM

## 2023-01-29 DIAGNOSIS — Z794 Long term (current) use of insulin: Secondary | ICD-10-CM

## 2023-01-29 DIAGNOSIS — K31811 Angiodysplasia of stomach and duodenum with bleeding: Secondary | ICD-10-CM

## 2023-01-29 DIAGNOSIS — K219 Gastro-esophageal reflux disease without esophagitis: Secondary | ICD-10-CM | POA: Insufficient documentation

## 2023-01-29 DIAGNOSIS — E119 Type 2 diabetes mellitus without complications: Secondary | ICD-10-CM

## 2023-01-29 HISTORY — PX: ESOPHAGOGASTRODUODENOSCOPY (EGD) WITH PROPOFOL: SHX5813

## 2023-01-29 HISTORY — PX: HEMOSTASIS CONTROL: SHX6838

## 2023-01-29 LAB — BASIC METABOLIC PANEL
Anion gap: 11 (ref 5–15)
BUN: 20 mg/dL (ref 8–23)
CO2: 25 mmol/L (ref 22–32)
Calcium: 8.3 mg/dL — ABNORMAL LOW (ref 8.9–10.3)
Chloride: 99 mmol/L (ref 98–111)
Creatinine, Ser: 0.85 mg/dL (ref 0.44–1.00)
GFR, Estimated: >60 mL/min (ref >60)
Glucose, Bld: 144 mg/dL — ABNORMAL HIGH (ref 70–99)
Potassium: 3.4 mmol/L — ABNORMAL LOW (ref 3.5–5.1)
Sodium: 135 mmol/L (ref 135–145)

## 2023-01-29 LAB — GLUCOSE, CAPILLARY
Glucose-Capillary: 88 mg/dL (ref 70–99)
Glucose-Capillary: 106 mg/dL — ABNORMAL HIGH (ref 70–99)

## 2023-01-29 LAB — CBC
HCT: 25.7 % — ABNORMAL LOW (ref 36.0–46.0)
Hemoglobin: 8.9 g/dL — ABNORMAL LOW (ref 12.0–15.0)
MCH: 31.2 pg (ref 26.0–34.0)
MCHC: 34.6 g/dL (ref 30.0–36.0)
MCV: 90.2 fL (ref 80.0–100.0)
Platelets: 70 10*3/uL — ABNORMAL LOW (ref 150–400)
RBC: 2.85 MIL/uL — ABNORMAL LOW (ref 3.87–5.11)
RDW: 13.9 % (ref 11.5–15.5)
WBC: 2 10*3/uL — ABNORMAL LOW (ref 4.0–10.5)
nRBC: 0 % (ref 0.0–0.2)

## 2023-01-29 LAB — SARS CORONAVIRUS 2 BY RT PCR: SARS Coronavirus 2 by RT PCR: NEGATIVE

## 2023-01-29 LAB — PROTIME-INR
INR: 1.5 — ABNORMAL HIGH (ref 0.8–1.2)
Prothrombin Time: 18.3 s — ABNORMAL HIGH (ref 11.4–15.2)

## 2023-01-29 LAB — TYPE AND SCREEN

## 2023-01-29 LAB — APTT: aPTT: 37 s — ABNORMAL HIGH (ref 24–36)

## 2023-01-29 LAB — LACTIC ACID, PLASMA: Lactic Acid, Venous: 1.6 mmol/L (ref 0.5–1.9)

## 2023-01-29 LAB — HEMOGLOBIN AND HEMATOCRIT, BLOOD
HCT: 26.4 % — ABNORMAL LOW (ref 36.0–46.0)
Hemoglobin: 8.7 g/dL — ABNORMAL LOW (ref 12.0–15.0)

## 2023-01-29 LAB — CBG MONITORING, ED: Glucose-Capillary: 146 mg/dL — ABNORMAL HIGH (ref 70–99)

## 2023-01-29 LAB — PROCALCITONIN: Procalcitonin: <0.1 ng/mL

## 2023-01-29 SURGERY — ESOPHAGOGASTRODUODENOSCOPY (EGD) WITH PROPOFOL
Anesthesia: General

## 2023-01-29 MED ORDER — INSULIN ASPART 100 UNIT/ML IJ SOLN
0.0000 [IU] | Freq: Three times a day (TID) | INTRAMUSCULAR | Status: DC
  Administered 2023-01-29: 2 [IU] via SUBCUTANEOUS
  Filled 2023-01-29: qty 1

## 2023-01-29 MED ORDER — MORPHINE SULFATE (PF) 2 MG/ML IV SOLN
2.0000 mg | INTRAVENOUS | Status: DC | PRN
  Filled 2023-01-29 (×2): qty 1

## 2023-01-29 MED ORDER — LIDOCAINE HCL (CARDIAC) PF 100 MG/5ML IV SOSY
PREFILLED_SYRINGE | INTRAVENOUS | Status: DC | PRN
  Administered 2023-01-29: 50 mg via INTRAVENOUS

## 2023-01-29 MED ORDER — PANTOPRAZOLE SODIUM 40 MG IV SOLR
40.0000 mg | Freq: Three times a day (TID) | INTRAVENOUS | Status: DC
  Administered 2023-01-29 – 2023-01-31 (×7): 40 mg via INTRAVENOUS
  Filled 2023-01-29 (×7): qty 10

## 2023-01-29 MED ORDER — POTASSIUM CHLORIDE CRYS ER 20 MEQ PO TBCR
40.0000 meq | EXTENDED_RELEASE_TABLET | Freq: Once | ORAL | Status: DC
  Filled 2023-01-29: qty 2

## 2023-01-29 MED ORDER — INSULIN ASPART 100 UNIT/ML IJ SOLN: 0.0000 [IU] | Freq: Every day | INTRAMUSCULAR | Status: DC

## 2023-01-29 MED ORDER — SODIUM CHLORIDE 0.9 % IV SOLN
2.0000 g | INTRAVENOUS | Status: DC
  Administered 2023-01-29 – 2023-01-30 (×2): 2 g via INTRAVENOUS
  Filled 2023-01-29 (×2): qty 20

## 2023-01-29 MED ORDER — INSULIN GLARGINE-YFGN 100 UNIT/ML ~~LOC~~ SOLN
50.0000 [IU] | Freq: Every day | SUBCUTANEOUS | Status: DC
  Filled 2023-01-29 (×4): qty 0.5

## 2023-01-29 MED ORDER — TRAMADOL HCL 50 MG PO TABS
50.0000 mg | ORAL_TABLET | ORAL | Status: DC | PRN
  Administered 2023-01-29 – 2023-01-30 (×2): 50 mg via ORAL
  Administered 2023-01-30: 100 mg via ORAL
  Filled 2023-01-29 (×2): qty 2
  Filled 2023-01-29: qty 1

## 2023-01-29 MED ORDER — PANTOPRAZOLE SODIUM 40 MG IV SOLR: 40.0000 mg | Freq: Two times a day (BID) | INTRAVENOUS | Status: DC

## 2023-01-29 MED ORDER — SODIUM CHLORIDE 0.9 % IV SOLN
500.0000 mg | INTRAVENOUS | Status: DC
  Administered 2023-01-29 – 2023-01-31 (×3): 500 mg via INTRAVENOUS
  Filled 2023-01-29 (×3): qty 5

## 2023-01-29 MED ORDER — SODIUM CHLORIDE 0.9 % IV SOLN: INTRAVENOUS | Status: DC | PRN

## 2023-01-29 MED ORDER — PROPOFOL 10 MG/ML IV BOLUS
INTRAVENOUS | Status: DC | PRN
  Administered 2023-01-29: 80 mg via INTRAVENOUS
  Administered 2023-01-29: 150 ug/kg/min via INTRAVENOUS

## 2023-01-29 MED ORDER — TRAMADOL HCL 50 MG PO TABS
50.0000 mg | ORAL_TABLET | Freq: Once | ORAL | Status: AC
  Administered 2023-01-29: 50 mg via ORAL
  Filled 2023-01-29: qty 1

## 2023-01-29 MED ORDER — TRAMADOL HCL 50 MG PO TABS: 50.0000 mg | ORAL_TABLET | Freq: Once | ORAL | Status: DC

## 2023-01-29 MED ORDER — SODIUM CHLORIDE 0.9 % IV SOLN
1.0000 g | Freq: Once | INTRAVENOUS | Status: AC
  Administered 2023-01-29: 1 g via INTRAVENOUS
  Filled 2023-01-29: qty 10

## 2023-01-29 MED ORDER — LACTATED RINGERS IV SOLN: 150.0000 mL/h | INTRAVENOUS | Status: DC

## 2023-01-29 NOTE — Transfer of Care (Signed)
Immediate Anesthesia Transfer of Care Note  Patient: Annette Foster  Procedure(s) Performed: ESOPHAGOGASTRODUODENOSCOPY (EGD) WITH PROPOFOL HEMOSTASIS CONTROL  Patient Location: Endoscopy Unit  Anesthesia Type:General  Level of Consciousness: sedated  Airway & Oxygen Therapy: Patient Spontanous Breathing and Patient connected to nasal cannula oxygen  Post-op Assessment: Report given to RN and Post -op Vital signs reviewed and stable  Post vital signs: Reviewed and stable  Last Vitals:  Vitals Value Taken Time  BP 92/51 01/29/23 1235  Temp 36 C 01/29/23 1233  Pulse 67 01/29/23 1235  Resp 21 01/29/23 1233  SpO2 100 % 01/29/23 1235    Last Pain:  Vitals:   01/29/23 1233  TempSrc: Temporal  PainSc: Asleep         Complications: No notable events documented.

## 2023-01-29 NOTE — Consult Note (Signed)
Consultation  Referring Provider:  ED    Admit date: 01/28/2023 Consult date: 01/29/2023         Reason for Consultation:  Melena/Hematochezia             HPI:   Annette Foster is a 82 y.o. 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 She had bleeding in July so underwent EGD/Colonoscopy. EGD showed varices with red wale signs so she was banded. The colonoscopy showed some polyps that were removed. She subsequently had two more banding sessions in September and then again last month. In 2022 she had two esophageal banding sessions as well. She has moderate portal hypertensive gastropathy. Her hemoglobin is currently around baseline. She endorses some rectal discomfort during the episodes of hematochezia. No hematemesis. She is hemodynamically stable. No blood thinners. No NSAIDS. She does have a pacemaker. She is on nadolol at home. She was recently diagnosed with pneumonia and has been undergoing treatment.  Past Medical History:  Diagnosis Date   Abdominal pain    Allergy    Cancer (HCC) 2017   breast cancer- Left   Cataract    CHF (congestive heart failure) (HCC)    Collagenous colitis    Coronary artery disease    Diabetes mellitus without complication (HCC)    Diarrhea    Diverticulosis    Dysrhythmia    Fatty liver disease, nonalcoholic    Fibrocystic breast    GERD (gastroesophageal reflux disease)    Heart murmur    Hyperlipidemia    Hypertension    Hypothyroidism    IBS (irritable bowel syndrome)    IDA (iron deficiency anemia)    Liver cirrhosis (HCC)    Personal history of radiation therapy 2017   LEFT BREAST CA   PONV (postoperative nausea and vomiting)    Presence of permanent cardiac pacemaker    Sleep apnea    C-Pap    Past Surgical History:  Procedure Laterality Date   ABDOMINAL HYSTERECTOMY     [place]  bso   ABDOMINAL SURGERY     APPENDECTOMY     AV NODE ABLATION N/A 05/17/2021   Procedure: AV NODE ABLATION;  Surgeon: Lanier Prude, MD;  [facility]: [facility];  Service: Cardiovascular;  Laterality: N/A;   BREAST BIOPSY Bilateral 2016   negative   BREAST 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/2015   DCIS, papillary carcinoma in situ   BREAST BIOPSY Left 05/27/2016   BENIGN MAMMARY EPITHELIUM   BREAST BIOPSY Left 11/20/2017   affirm bx x clip BENIGN MAMMARY EPITHELIUM CONSISTENT WITH RAD THERAPY   BREAST EXCISIONAL BIOPSY Right    NEG 1980's   BREAST LUMPECTOMY Left 10/17/2015   DCIS and papillary carcinoma insitu, clear margins   CARDIAC SURGERY     has replacement valve   CATARACT EXTRACTION Right    CATARACT EXTRACTION W/PHACO Left 01/16/2021   Procedure: CATARACT EXTRACTION PHACO AND INTRAOCULAR LENS PLACEMENT (IOC) LEFT DIABETIC 8.46 00:59.8;  Surgeon: Galen Manila, MD;  Location: [facility];  Service: Ophthalmology;  Laterality: Left;   CHOLECYSTECTOMY     COLONOSCOPY WITH PROPOFOL N/A 03/11/2016   Procedure: COLONOSCOPY WITH PROPOFOL;  Surgeon: Scot Jun, MD;  Location: [facility] ENDOSCOPY;  Service: Endoscopy;  Laterality: N/A;   COLONOSCOPY WITH PROPOFOL N/A 02/18/2020   Procedure: COLONOSCOPY WITH PROPOFOL;  Surgeon: Midge Minium, MD;  Location: [facility] ENDOSCOPY;  Service: Endoscopy;  Laterality: N/A;   COLONOSCOPY WITH PROPOFOL N/A 09/10/2022   Procedure: COLONOSCOPY WITH PROPOFOL;  Surgeon: Wyline Mood, MD;  Location: [facility] ENDOSCOPY;  Service: Gastroenterology;  Laterality: N/A;   ESOPHAGEAL BANDING  09/10/2022   Procedure: ESOPHAGEAL BANDING;  Surgeon: Wyline Mood, MD;  Location: [facility] ENDOSCOPY;  Service: Gastroenterology;;   ESOPHAGEAL BANDING  10/17/2022   Procedure: ESOPHAGEAL BANDING;  Surgeon: Midge Minium, MD;  Location: [facility] ENDOSCOPY;  Service: Endoscopy;;   ESOPHAGEAL BANDING  12/24/2022   Procedure: ESOPHAGEAL BANDING;  Surgeon: Midge Minium, MD;  Location:  [facility] ENDOSCOPY;  Service: Endoscopy;;   ESOPHAGOGASTRODUODENOSCOPY (EGD) WITH PROPOFOL N/A 03/11/2016   Procedure: ESOPHAGOGASTRODUODENOSCOPY (EGD) WITH PROPOFOL;  Surgeon: Scot Jun, MD;  Location: [facility] ENDOSCOPY;  Service: Endoscopy;  Laterality: N/A;   ESOPHAGOGASTRODUODENOSCOPY (EGD) WITH PROPOFOL N/A 02/18/2020   Procedure: ESOPHAGOGASTRODUODENOSCOPY (EGD) WITH PROPOFOL;  Surgeon: Midge Minium, MD;  Location: [facility] ENDOSCOPY;  Service: Endoscopy;  Laterality: N/A;   ESOPHAGOGASTRODUODENOSCOPY (EGD) WITH PROPOFOL N/A 04/25/2020   Procedure: ESOPHAGOGASTRODUODENOSCOPY (EGD) WITH PROPOFOL;  Surgeon: Midge Minium, MD;  Location: [facility] ENDOSCOPY;  Service: Endoscopy;  Laterality: N/A;   ESOPHAGOGASTRODUODENOSCOPY (EGD) WITH PROPOFOL N/A 05/23/2020   Procedure: ESOPHAGOGASTRODUODENOSCOPY (EGD) WITH PROPOFOL;  Surgeon: Midge Minium, MD;  Location: [facility] ENDOSCOPY;  Service: Endoscopy;  Laterality: N/A;   ESOPHAGOGASTRODUODENOSCOPY (EGD) WITH PROPOFOL N/A 09/10/2022   Procedure: ESOPHAGOGASTRODUODENOSCOPY (EGD) WITH PROPOFOL;  Surgeon: Wyline Mood, MD;  Location: [facility] ENDOSCOPY;  Service: Gastroenterology;  Laterality: N/A;   ESOPHAGOGASTRODUODENOSCOPY (EGD) WITH PROPOFOL N/A 10/17/2022   Procedure: ESOPHAGOGASTRODUODENOSCOPY (EGD) WITH PROPOFOL;  Surgeon: Midge Minium, MD;  Location: [facility] ENDOSCOPY;  Service: Endoscopy;  Laterality: N/A;   ESOPHAGOGASTRODUODENOSCOPY (EGD) WITH PROPOFOL N/A 12/24/2022   Procedure: ESOPHAGOGASTRODUODENOSCOPY (EGD) WITH PROPOFOL;  Surgeon: Midge Minium, MD;  Location: [facility] ENDOSCOPY;  Service: Endoscopy;  Laterality: N/A;   EUS N/A 10/04/2021   Procedure: LOWER ENDOSCOPIC ULTRASOUND (EUS);  Surgeon: Doren Custard, MD;  Location: [facility] ENDOSCOPY;  Service: Gastroenterology;  Laterality: N/A;  [facility]   EYE SURGERY     FINGER ARTHROSCOPY WITH CARPOMETACARPEL (CMC) ARTHROPLASTY Right 09/03/2018   Procedure: CARPOMETACARPEL [facility]) ARTHROPLASTY RIGHT THUMB;  Surgeon:  Kennedy Bucker, MD;  Location: [facility] ORS;  Service: Orthopedics;  Laterality: Right;   FLEXIBLE SIGMOIDOSCOPY N/A 09/14/2021   Procedure: FLEXIBLE SIGMOIDOSCOPY;  Surgeon: Midge Minium, MD;  Location: [facility] ENDOSCOPY;  Service: Endoscopy;  Laterality: N/A;  No anesthesia   GANGLION CYST EXCISION Right 09/03/2018   Procedure: REMOVAL GANGLION OF WRIST;  Surgeon: Kennedy Bucker, MD;  Location: [facility] ORS;  Service: Orthopedics;  Laterality: Right;   HARDWARE REMOVAL Right 09/03/2018   Procedure: HARDWARE REMOVAL RIGHT THUMB;  Surgeon: Kennedy Bucker, MD;  Location: [facility] ORS;  Service: Orthopedics;  Laterality: Right;  staple removed   HEMOSTASIS CLIP PLACEMENT  09/10/2022   Procedure: HEMOSTASIS CLIP PLACEMENT;  Surgeon: Wyline Mood, MD;  Location: [facility] ENDOSCOPY;  Service: Gastroenterology;;   HEMOSTASIS CONTROL  09/10/2022   Procedure: HEMOSTASIS CONTROL;  Surgeon: Wyline Mood, MD;  Location: [facility] ENDOSCOPY;  Service: Gastroenterology;;   JOINT REPLACEMENT Left    TKR   left sinusplasty      MASTECTOMY, PARTIAL Left 10/17/2015   Procedure: MASTECTOMY PARTIAL REVISION;  Surgeon: Nadeen Landau, MD;  Location: [facility] ORS;  Service: General;  Laterality: Left;   PACEMAKER IMPLANT N/A 03/23/2021   Procedure: PACEMAKER IMPLANT;  Surgeon: Lanier Prude, MD;  [facility];  Service: Cardiovascular;  Laterality: N/A;  PARTIAL MASTECTOMY WITH NEEDLE LOCALIZATION Left 09/29/2015   Procedure: PARTIAL MASTECTOMY WITH NEEDLE LOCALIZATION;  Surgeon: Nadeen Landau, MD;  Location: [facility] ORS;  Service: General;  Laterality: Left;   POLYPECTOMY  09/10/2022   Procedure: POLYPECTOMY;  Surgeon: Wyline Mood, MD;  Location: [facility] ENDOSCOPY;  Service: Gastroenterology;;   RECTAL EXAM UNDER ANESTHESIA N/A 10/05/2021   Procedure: RECTAL EXAM UNDER ANESTHESIA, ASPIRATION OF RECTAL CYST;  Surgeon: Carolan Shiver, MD;  Location: [facility] ORS;  Service: General;  Laterality: N/A;   TOTAL ABDOMINAL  HYSTERECTOMY W/ BILATERAL SALPINGOOPHORECTOMY      Family History  Problem Relation Age of Onset   Breast cancer Paternal Grandmother    Colon cancer Father    Diabetes Sister    Diabetes Brother    Heart disease Brother    Prostate cancer Brother    Colon cancer Maternal Uncle    Prostate cancer Brother    Bladder Cancer Brother    Leukemia Mother        all   Ovarian cancer Neg Hx    Kidney cancer Neg Hx     Social History   Tobacco Use   Smoking status: Never   Smokeless tobacco: Never  Vaping Use   Vaping status: Never Used  Substance Use Topics   Alcohol use: Never   Drug use: Never    Prior to Admission medications   Medication Sig Start Date End Date Taking? Authorizing Provider  amoxicillin-clavulanate (AUGMENTIN) 875-125 MG tablet Take 1 tablet by mouth 2 (two) times daily. 01/24/23  Yes [provider]  Azelastine HCl 137 MCG/SPRAY SOLN INHALE 1-2 PUFFS IN EACH NOSTRIL NASALLY ONCE A DAY 30 DAY(S)   Yes [provider]  benzonatate (TESSALON) 100 MG capsule Take 100 mg by mouth 3 (three) times daily as needed. 10/28/22  Yes [provider]  cefdinir (OMNICEF) 300 MG capsule Take 300 mg by mouth 2 (two) times daily. 10/11/22  Yes [provider]  Cholecalciferol (VITAMIN D) 50 MCG (2000 UT) CAPS Take 2,000 Units by mouth daily.   Yes [provider]  clobetasol ointment (TEMOVATE) 0.05 % Apply 1 application topically as needed (vaginal irritation).   Yes [provider]  clotrimazole (LOTRIMIN) 1 % cream Apply 1 Application topically 2 (two) times daily as needed.   Yes [provider]  cyanocobalamin (,VITAMIN B-12,) 1000 MCG/ML injection 1,000 mcg every 30 (thirty) days. 04/23/19  Yes [provider]  docusate sodium (COLACE) 100 MG capsule Take 100 mg by mouth 3 (three) times daily as needed for moderate constipation. 01/10/20  Yes [provider]  doxycycline (VIBRAMYCIN) 100 MG  capsule Take 100 mg by mouth 2 (two) times daily. 01/21/23  Yes [provider]  esomeprazole (NEXIUM) 40 MG capsule Take 40 mg by mouth 2 (two) times daily before a meal.  02/08/16  Yes [provider]  estradiol (ESTRACE) 0.1 MG/GM vaginal cream Place 1 Applicatorful vaginally daily as needed (irritation).   Yes [provider]  fenofibrate 160 MG tablet Take 160 mg by mouth at bedtime.   Yes [provider]  gabapentin (NEURONTIN) 300 MG capsule Take 300 mg by mouth at bedtime.  09/07/16 10/25/36 Yes [provider]  hydrOXYzine (ATARAX/VISTARIL) 25 MG tablet Take 25 mg by mouth every 6 (six) hours as needed for anxiety, itching, nausea or vomiting.   Yes [provider]  levothyroxine (SYNTHROID, LEVOTHROID) 75 MCG tablet Take 75 mcg by mouth daily before breakfast.    Yes  [provider]  magnesium oxide (MAG-OX) 400 MG tablet Take 400 mg by mouth 2 (two) times daily.    Yes [provider]  metoprolol tartrate (LOPRESSOR) 50 MG tablet TAKE 1 TABLET 2 HR PRIOR TO CARDIAC PROCEDURE 10/04/22  Yes Furth, Cadence H, PA-C  nadolol (CORGARD) 20 MG tablet Take 1 tablet (20 mg total) by mouth daily. 12/20/22  Yes End, Cristal Deer, MD  NONFORMULARY OR COMPOUNDED ITEM Nifedipine 0.3% plus lidocaine 2% cream apply to rectum BID and after every BM.   Yes [provider]  NOVOLOG FLEXPEN 100 UNIT/ML FlexPen Inject 48-68 Units into the skin 3 (three) times daily with meals. Based on sliding scale 08/14/19  Yes [provider]  potassium chloride SA (K-DUR,KLOR-CON) 20 MEQ tablet Take 20 mEq by mouth 2 (two) times daily.    Yes [provider]  saccharomyces boulardii (FLORASTOR) 250 MG capsule Take 250 mg by mouth 2 (two) times daily.    Yes [provider]  simvastatin (ZOCOR) 20 MG tablet Take 20 mg by mouth at bedtime.   Yes [provider]  spironolactone (ALDACTONE) 25 MG tablet TAKE 1/2 TABLET  BY MOUTH DAILY 05/03/22  Yes End, Cristal Deer, MD  torsemide (DEMADEX) 20 MG tablet TAKE 1 TABLET BY MOUTH EVERY DAY 03/02/21  Yes End, Cristal Deer, MD  TRESIBA FLEXTOUCH 200 UNIT/ML FlexTouch Pen 194 Unit(s) SUB-Q Daily   Yes [provider]  valACYclovir (VALTREX) 500 MG tablet Take 500 mg by mouth 2 (two) times daily as needed (fever blisters). 01/06/17  Yes [provider]  cyclobenzaprine (FLEXERIL) 5 MG tablet Take 1 tablet by mouth 2 (two) times daily. Patient not taking: Reported on 10/17/2022    [provider]  dicyclomine (BENTYL) 10 MG capsule Take 10 mg by mouth in the morning, at noon, and at bedtime. 05/16/19   [provider]  EPINEPHrine 0.3 mg/0.3 mL IJ SOAJ injection Inject 0.3 mg into the muscle as needed for anaphylaxis. Patient not taking: Reported on 11/13/2022 05/18/19   [provider]  levocetirizine (XYZAL) 5 MG tablet Take 5 mg by mouth every evening.    [provider]  MOUNJARO 7.5 MG/0.5ML Pen Inject 12.5 mg into the skin once a week. 02/15/22   [provider]    Current Facility-Administered Medications  Medication Dose Route Frequency Provider Last Rate Last Admin   0.9 % NaCl with KCl 20 mEq/ L  infusion   Intravenous Continuous Mansy, Jan A, MD 125 mL/hr at 01/29/23 0734 Rate Verify at 01/29/23 0734   acetaminophen (TYLENOL) tablet 650 mg  650 mg Oral Q6H PRN Mansy, Jan A, MD       Or   acetaminophen (TYLENOL) suppository 650 mg  650 mg Rectal Q6H PRN Mansy, Jan A, MD       azithromycin (ZITHROMAX) 500 mg in sodium chloride 0.9 % 250 mL IVPB  500 mg Intravenous Q24H Mansy, Jan A, MD   Stopped at 01/29/23 0734   cefTRIAXone (ROCEPHIN) 2 g in sodium chloride 0.9 % 100 mL IVPB  2 g Intravenous Q24H Mansy, Jan A, MD       fenofibrate tablet 160 mg  160 mg Oral QHS Mansy, Jan A, MD       gabapentin (NEURONTIN) capsule 300 mg  300 mg Oral QHS Mansy, Jan A, MD       insulin aspart (novoLOG) injection 0-15 Units   0-15 Units Subcutaneous TID WC Mansy, Vernetta Honey, MD   2 Units at  01/29/23 0824   insulin aspart (novoLOG) injection 0-5 Units  0-5 Units Subcutaneous QHS Mansy, Jan A, MD       insulin glargine-yfgn [facility]) injection 50 Units  50 Units Subcutaneous QHS Mansy, Jan A, MD       levothyroxine (SYNTHROID) tablet 75 mcg  75 mcg Oral Q0600 Mansy, Jan A, MD       morphine (PF) 2 MG/ML injection 2 mg  2 mg Intravenous Q4H PRN Mansy, Jan A, MD       nadolol (CORGARD) tablet 20 mg  20 mg Oral Daily Mansy, Jan A, MD       octreotide (SANDOSTATIN) 500 mcg in sodium chloride 0.9 % 250 mL (2 mcg/mL) infusion  50 mcg/hr Intravenous Continuous Merwyn Katos, MD 25 mL/hr at 01/29/23 0734 50 mcg/hr at 01/29/23 0734   ondansetron (ZOFRAN) tablet 4 mg  4 mg Oral Q6H PRN Mansy, Jan A, MD       Or   ondansetron [facility]) injection 4 mg  4 mg Intravenous Q6H PRN Mansy, Jan A, MD   4 mg at 01/29/23 0516   pantoprazole (PROTONIX) injection 40 mg  40 mg Intravenous Q8H Barrie Folk, RPH   40 mg at 01/29/23 4034   Followed by   Melene Muller ON 02/01/2023] pantoprazole (PROTONIX) injection 40 mg  40 mg Intravenous Q12H Prince Solian F, RPH       potassium chloride SA (KLOR-CON M) CR tablet 20 mEq  20 mEq Oral BID Mansy, Jan A, MD       simvastatin (ZOCOR) tablet 20 mg  20 mg Oral QHS Mansy, Jan A, MD       spironolactone (ALDACTONE) tablet 12.5 mg  12.5 mg Oral Daily Mansy, Jan A, MD       traMADol Janean Sark) tablet 50 mg  50 mg Oral Once Jawo, Modou L, NP       traZODone (DESYREL) tablet 25 mg  25 mg Oral QHS PRN Mansy, Vernetta Honey, MD       Current Outpatient Medications  Medication Sig Dispense Refill   amoxicillin-clavulanate (AUGMENTIN) 875-125 MG tablet Take 1 tablet by mouth 2 (two) times daily.     Azelastine HCl 137 MCG/SPRAY SOLN INHALE 1-2 PUFFS IN EACH NOSTRIL NASALLY ONCE A DAY 30 DAY(S)     benzonatate (TESSALON) 100 MG capsule Take 100 mg by mouth 3 (three) times daily as needed.     cefdinir (OMNICEF) 300 MG  capsule Take 300 mg by mouth 2 (two) times daily.     Cholecalciferol (VITAMIN D) 50 MCG (2000 UT) CAPS Take 2,000 Units by mouth daily.     clobetasol ointment (TEMOVATE) 0.05 % Apply 1 application topically as needed (vaginal irritation).     clotrimazole (LOTRIMIN) 1 % cream Apply 1 Application topically 2 (two) times daily as needed.     cyanocobalamin (,VITAMIN B-12,) 1000 MCG/ML injection 1,000 mcg every 30 (thirty) days.     docusate sodium (COLACE) 100 MG capsule Take 100 mg by mouth 3 (three) times daily as needed for moderate constipation.     doxycycline (VIBRAMYCIN) 100 MG capsule Take 100 mg by mouth 2 (two) times daily.     esomeprazole (NEXIUM) 40 MG capsule Take 40 mg by mouth 2 (two) times daily before a meal.      estradiol (ESTRACE) 0.1 MG/GM vaginal cream Place 1 Applicatorful vaginally daily as needed (irritation).     fenofibrate 160 MG tablet Take 160 mg by mouth at bedtime.  gabapentin (NEURONTIN) 300 MG capsule Take 300 mg by mouth at bedtime.      hydrOXYzine (ATARAX/VISTARIL) 25 MG tablet Take 25 mg by mouth every 6 (six) hours as needed for anxiety, itching, nausea or vomiting.     levothyroxine (SYNTHROID, LEVOTHROID) 75 MCG tablet Take 75 mcg by mouth daily before breakfast.      magnesium oxide (MAG-OX) 400 MG tablet Take 400 mg by mouth 2 (two) times daily.      metoprolol tartrate (LOPRESSOR) 50 MG tablet TAKE 1 TABLET 2 HR PRIOR TO CARDIAC PROCEDURE 1 tablet 0   nadolol (CORGARD) 20 MG tablet Take 1 tablet (20 mg total) by mouth daily. 90 tablet 3   NONFORMULARY OR COMPOUNDED ITEM Nifedipine 0.3% plus lidocaine 2% cream apply to rectum BID and after every BM.     NOVOLOG FLEXPEN 100 UNIT/ML FlexPen Inject 48-68 Units into the skin 3 (three) times daily with meals. Based on sliding scale     potassium chloride SA (K-DUR,KLOR-CON) 20 MEQ tablet Take 20 mEq by mouth 2 (two) times daily.      saccharomyces boulardii (FLORASTOR) 250 MG capsule Take 250 mg by mouth 2  (two) times daily.      simvastatin (ZOCOR) 20 MG tablet Take 20 mg by mouth at bedtime.     spironolactone (ALDACTONE) 25 MG tablet TAKE 1/2 TABLET BY MOUTH DAILY 45 tablet 3   torsemide (DEMADEX) 20 MG tablet TAKE 1 TABLET BY MOUTH EVERY DAY 90 tablet 0   TRESIBA FLEXTOUCH 200 UNIT/ML FlexTouch Pen 194 Unit(s) SUB-Q Daily     valACYclovir (VALTREX) 500 MG tablet Take 500 mg by mouth 2 (two) times daily as needed (fever blisters).  1   cyclobenzaprine (FLEXERIL) 5 MG tablet Take 1 tablet by mouth 2 (two) times daily. (Patient not taking: Reported on 10/17/2022)     dicyclomine (BENTYL) 10 MG capsule Take 10 mg by mouth in the morning, at noon, and at bedtime.     EPINEPHrine 0.3 mg/0.3 mL IJ SOAJ injection Inject 0.3 mg into the muscle as needed for anaphylaxis. (Patient not taking: Reported on 11/13/2022)     levocetirizine (XYZAL) 5 MG tablet Take 5 mg by mouth every evening.     MOUNJARO 7.5 MG/0.5ML Pen Inject 12.5 mg into the skin once a week.      Allergies as of 01/28/2023 - Review Complete 01/28/2023  Allergen Reaction Noted   Codeine Itching, Nausea And Vomiting, and Other (See Comments) 06/28/2014   Demeclocycline Rash 04/10/2012   Demerol [meperidine] Itching and Nausea And Vomiting 06/28/2014   Hydrocodone Itching and Nausea And Vomiting 08/24/2018   Other Other (See Comments) 04/10/2012   Oxycodone Itching and Nausea And Vomiting 09/26/2015   Pentazocine Itching, Nausea And Vomiting, and Other (See Comments) 04/10/2012   Tetracyclines & related Rash 06/28/2014   Coal tar extract Other (See Comments) 03/19/2022   Hydrocodone-acetaminophen Other (See Comments) 01/30/2018   Salicylic acid Other (See Comments) 04/02/2022   Tetracycline hcl Other (See Comments) 04/02/2022   Fentanyl Itching, Nausea And Vomiting, and Other (See Comments) 06/28/2014     Review of Systems:    All systems reviewed and negative except where noted in HPI.  Review of Systems  Constitutional:   Negative for chills and fever.  Respiratory:  Negative for shortness of breath.   Cardiovascular:  Negative for chest pain.  Gastrointestinal:  Positive for blood in stool, melena and nausea. Negative for abdominal pain, constipation, diarrhea and vomiting.  Skin:  Negative for rash.  Neurological:  Negative for focal weakness.  Psychiatric/Behavioral:  Negative for substance abuse.   All other systems reviewed and are negative.      Physical Exam:  Vital signs in last 24 hours: Temp:  [98.5 F (36.9 C)-98.7 F (37.1 C)] 98.5 F (36.9 C) (12/18 0426) Pulse Rate:  [68-82] 80 (12/18 0830) Resp:  [16-26] 16 (12/18 0830) BP: (102-122)/(57-74) 108/57 (12/18 0830) SpO2:  [93 %-100 %] 99 % (12/18 0830) Weight:  [13 kg] 63 kg (12/17 1912)   General:   Pleasant in NAD Head:  Normocephalic and atraumatic. Eyes:   No icterus.   Conjunctiva pink. Mouth: Mucosa pink moist, no lesions. Neck:  Supple; no masses felt Lungs:  No respiratory distress Heart:  No edema. Abdomen:   Flat, soft, nondistended, nontender Msk:  No clubbing or cyanosis.ymmetrical without gross deformities. Neurologic:  No focal deficits Skin:  Warm, dry, pink without significant lesions or rashes. Psych:  Alert and cooperative. Normal affect.  LAB RESULTS: Recent Labs    01/28/23 2109 01/28/23 2347 01/29/23 0438  WBC 2.6*  --  2.0*  HGB 8.6* 8.6* 8.9*  HCT 24.9* 25.0* 25.7*  PLT 75*  --  70*   BMET Recent Labs    01/28/23 1916 01/29/23 0438  NA 133* 135  K 3.1* 3.4*  CL 96* 99  CO2 26 25  GLUCOSE 142* 144*  BUN 19 20  CREATININE 0.78 0.85  CALCIUM 8.5* 8.3*   LFT Recent Labs    01/28/23 1916  PROT 7.9  ALBUMIN 3.3*  AST 30  ALT 14  ALKPHOS 55  BILITOT 1.1   PT/INR Recent Labs    01/29/23 0438  LABPROT 18.3*  INR 1.5*    STUDIES: DG Chest 1 View Result Date: 01/29/2023 CLINICAL DATA:  Pneumonia EXAM: CHEST  1 VIEW COMPARISON:  01/21/2023 FINDINGS: Slightly increased airspace  opacities in the right middle lobe compared to 01/21/2023. Question new opacities in the left mid and lower lung. No pleural effusion or pneumothorax. Stable cardiomediastinal silhouette. Aortic atherosclerotic calcification. Right chest wall pacemaker. IMPRESSION: Slightly increased airspace opacities in the right middle lobe compared to 01/21/2023. Question new opacities in the left mid and lower lung. Follow-up in 6-8 weeks after treatment is recommended to ensure resolution. Electronically Signed   By: Minerva Fester M.D.   On: 01/29/2023 00:00       Impression / Plan:   82 y/o lady with history of MASLD cirrhosis here with melena and hematochezia with recent history of banded esophageal varices. Patient's hemoglobin is at baseline and is hemodynamically stable so doesn't appear to be a variceal bleed but given history will proceed with EGD for further evaluation.  - NPO - continue octreotide drip - continue IV PPI BID - normally we discontinue beta-blockers and diuretics in setting of variceal bleed but looks like she received these. Will discontinue after EGD if any signs of a variceal bleed - continue IV antibiotics - tylenol limit in cirrhotics is 2 grams - daily CMP and INR - further recs after EGD  Merlyn Lot MD, MPH [facility]

## 2023-01-29 NOTE — Assessment & Plan Note (Signed)
-   Sepsis manifested by leukopenia of 2.6 and tachypnea of 26 shortly upon admission. - The patient will be admitted to a medical telemetry bed. - Will continue antibiotic therapy with IV Rocephin and Zithromax. - Mucolytic therapy be provided as well as duo nebs q.i.d. and q.4 hours p.r.n. - We will follow blood cultures.

## 2023-01-29 NOTE — Anesthesia Postprocedure Evaluation (Signed)
Anesthesia Post Note  Patient: Emelyn Nakanishi  Procedure(s) Performed: ESOPHAGOGASTRODUODENOSCOPY (EGD) WITH PROPOFOL HEMOSTASIS CONTROL  Patient location during evaluation: Endoscopy Anesthesia Type: General Level of consciousness: awake and alert Pain management: pain level controlled Vital Signs Assessment: post-procedure vital signs reviewed and stable Respiratory status: spontaneous breathing, nonlabored ventilation, respiratory function stable and patient connected to nasal cannula oxygen Cardiovascular status: blood pressure returned to baseline and stable Postop Assessment: no apparent nausea or vomiting Anesthetic complications: no  No notable events documented.   Last Vitals:  Vitals:   01/29/23 1303 01/29/23 1313  BP: (!) 96/50 (!) 98/46  Pulse: 64 64  Resp: 18 18  Temp:    SpO2: 100% 100%    Last Pain:  Vitals:   01/29/23 1303  TempSrc:   PainSc: 0-No pain                 Stephanie Coup

## 2023-01-29 NOTE — Plan of Care (Signed)

## 2023-01-29 NOTE — Op Note (Addendum)
[facility] Gastroenterology Patient Name: Annette Foster Procedure Date: 01/29/2023 12:01 PM MRN: [id number] Account #: [id number] Date of Birth: [birth year] Admit Type: Inpatient Age: 82 Room: [facility] ENDO ROOM 4 Gender: Female Note Status: Supervisor Override Instrument Name: Patton Salles Endoscope [id number] Procedure:             Upper GI endoscopy Indications:           Melena Providers:             Eather Colas MD, MD Referring MD:          Jillene Bucks. Arlana Pouch, MD (Referring MD) Medicines:             Monitored Anesthesia Care Complications:         No immediate complications. Procedure:             Pre-Anesthesia Assessment:                        - Prior to the procedure, a History and Physical was                         performed, and patient medications and allergies were                         reviewed. The patient is competent. The risks and                         benefits of the procedure and the sedation options and                         risks were discussed with the patient. All questions                         were answered and informed consent was obtained.                         Patient identification and proposed procedure were                         verified by the physician, the nurse, the                         anesthesiologist, the anesthetist and the technician                         in the endoscopy suite. Mental Status Examination:                         alert and oriented. Airway Examination: normal                         oropharyngeal airway and neck mobility. Respiratory                         Examination: clear to auscultation. CV Examination:                         normal. Prophylactic Antibiotics: The patient does not  require prophylactic antibiotics. Prior                         Anticoagulants: The patient has taken no anticoagulant                         or antiplatelet agents. ASA Grade Assessment: III - A                          patient with severe systemic disease. After reviewing                         the risks and benefits, the patient was deemed in                         satisfactory condition to undergo the procedure. The                         anesthesia plan was to use monitored anesthesia care                         (MAC). Immediately prior to administration of                         medications, the patient was re-assessed for adequacy                         to receive sedatives. The heart rate, respiratory                         rate, oxygen saturations, blood pressure, adequacy of                         pulmonary ventilation, and response to care were                         monitored throughout the procedure. The physical                         status of the patient was re-assessed after the                         procedure.                        After obtaining informed consent, the endoscope was                         passed under direct vision. Throughout the procedure,                         the patient's blood pressure, pulse, and oxygen                         saturations were monitored continuously. The Endoscope                         was introduced through the mouth, and advanced to the  second part of duodenum. The upper GI endoscopy was                         accomplished without difficulty. The patient tolerated                         the procedure well. Findings:      Grade II varices were found in the lower third of the esophagus. They       were large in size. No nipple sign seen on the varices.      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      A single large angiodysplastic lesion with bleeding was found in the       duodenal bulb. Coagulation for hemostasis using argon plasma was       successful. Impression:            - Grade II esophageal varices.                        - Portal hypertensive  gastropathy.                        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 Treated with argon plasma coagulation (APC).                        - No specimens collected. Recommendation:        - Return patient to hospital ward for ongoing care.                        - Clear liquid diet today.                        - Continue present medications.                        - Given actively bleeding duodenal avm, the varices                         were not retreated. Would recommend continuing                         octreotide for 48 more hours, continuing abx to                         complete a 7 day course, and continuing her beta                         blocker. She will need repeat EGD as an outpatient for                         variceal eradication. Procedure Code(s):     --- Professional ---                        [id number], Esophagogastroduodenoscopy, flexible,  transoral; with control of bleeding, any method Diagnosis Code(s):     --- Professional ---                        I85.00, Esophageal varices without bleeding                        K76.6, Portal hypertension                        K31.89, Other diseases of stomach and duodenum                        K31.811, Angiodysplasia of stomach and duodenum with                         bleeding                        K92.1, Melena (includes Hematochezia) CPT copyright 2022 [employer]. All rights reserved. The codes documented in this report are preliminary and upon coder review may  be revised to meet current compliance requirements. Eather Colas MD, MD 01/29/2023 12:41:43 PM Number of Addenda: 0 Note Initiated On: 01/29/2023 12:01 PM Estimated Blood Loss:  Estimated blood loss: none.      [facility]

## 2023-01-29 NOTE — Anesthesia Preprocedure Evaluation (Addendum)
Anesthesia Evaluation  Patient identified by MRN, date of birth, ID band Patient awake    Reviewed: Allergy & Precautions, NPO status , Patient's Chart, lab work & pertinent test results  History of Anesthesia Complications (+) PONV and history of anesthetic complications  Airway Mallampati: III  TM Distance: <3 FB Neck ROM: full    Dental  (+) Chipped, Poor Dentition, Missing, Partial Lower, Upper Dentures, Dental Advidsory Given   Pulmonary sleep apnea    Pulmonary exam normal        Cardiovascular hypertension, + CAD and +CHF  + dysrhythmias Atrial Fibrillation + pacemaker + Valvular Problems/Murmurs      Neuro/Psych negative neurological ROS  negative psych ROS   GI/Hepatic ,GERD  Controlled,,(+) Hepatitis -Hx of esophageal varices   Endo/Other  diabetesHypothyroidism    Renal/GU Renal disease     Musculoskeletal   Abdominal   Peds  Hematology  (+) Blood dyscrasia, anemia   Anesthesia Other Findings Past Medical History: No date: Abdominal pain No date: Allergy 2017: Cancer (HCC)     Comment:  breast cancer- Left No date: Cataract No date: CHF (congestive heart failure) (HCC) No date: Collagenous colitis No date: Coronary artery disease No date: Diabetes mellitus without complication (HCC) No date: Diarrhea No date: Diverticulosis No date: Dysrhythmia No date: Fatty liver disease, nonalcoholic No date: Fibrocystic breast No date: GERD (gastroesophageal reflux disease) No date: Heart murmur No date: Hyperlipidemia No date: Hypertension No date: Hypothyroidism No date: IBS (irritable bowel syndrome) No date: IDA (iron deficiency anemia) No date: Liver cirrhosis (HCC) 2017: Personal history of radiation therapy     Comment:  LEFT BREAST CA No date: PONV (postoperative nausea and vomiting) No date: Presence of permanent cardiac pacemaker No date: Sleep apnea     Comment:  C-Pap  Past Surgical  History: No date: ABDOMINAL HYSTERECTOMY     Comment:  [place] bso No date: ABDOMINAL SURGERY No date: APPENDECTOMY 05/17/2021: AV NODE ABLATION; N/A     Comment:  Procedure: AV NODE ABLATION;  Surgeon: Lanier Prude, MD;  Location: [facility] INVASIVE CV LAB;  Service:               Cardiovascular;  Laterality: N/A; 2016: BREAST BIOPSY; Bilateral     Comment:  negative 09/11/2015: BREAST BIOPSY; Left     Comment:  DCIS, papillary carcinoma in situ 05/27/2016: BREAST BIOPSY; Left     Comment:  BENIGN MAMMARY EPITHELIUM 11/20/2017: BREAST BIOPSY; Left     Comment:  affirm bx x clip BENIGN MAMMARY EPITHELIUM CONSISTENT               WITH RAD THERAPY No date: BREAST EXCISIONAL BIOPSY; Right     Comment:  NEG 1980's 10/17/2015: BREAST LUMPECTOMY; Left     Comment:  DCIS and papillary carcinoma insitu, clear margins No date: CARDIAC SURGERY     Comment:  has replacement valve No date: CATARACT EXTRACTION; Right 01/16/2021: CATARACT EXTRACTION W/PHACO; Left     Comment:  Procedure: CATARACT EXTRACTION PHACO AND INTRAOCULAR               LENS PLACEMENT (IOC) LEFT DIABETIC 8.46 00:59.8;                Surgeon: Galen Manila, MD;  Location: [facility] SURGERY              CNTR;  Service: Ophthalmology;  Laterality: Left; No date: CHOLECYSTECTOMY  03/11/2016: COLONOSCOPY WITH PROPOFOL; N/A     Comment:  Procedure: COLONOSCOPY WITH PROPOFOL;  Surgeon: Scot Jun, MD;  Location: [facility] ENDOSCOPY;  Service:               Endoscopy;  Laterality: N/A; 02/18/2020: COLONOSCOPY WITH PROPOFOL; N/A     Comment:  Procedure: COLONOSCOPY WITH PROPOFOL;  Surgeon: Midge Minium, MD;  Location: [facility] ENDOSCOPY;  Service:               Endoscopy;  Laterality: N/A; 09/10/2022: COLONOSCOPY WITH PROPOFOL; N/A     Comment:  Procedure: COLONOSCOPY WITH PROPOFOL;  Surgeon: Wyline Mood, MD;  Location: [facility] ENDOSCOPY;  Service:               Gastroenterology;   Laterality: N/A; 09/10/2022: ESOPHAGEAL BANDING     Comment:  Procedure: ESOPHAGEAL BANDING;  Surgeon: Wyline Mood,               MD;  Location: [facility] ENDOSCOPY;  Service:               Gastroenterology;; 03/11/2016: ESOPHAGOGASTRODUODENOSCOPY (EGD) WITH PROPOFOL; N/A     Comment:  Procedure: ESOPHAGOGASTRODUODENOSCOPY (EGD) WITH               PROPOFOL;  Surgeon: Scot Jun, MD;  Location: [facility]              ENDOSCOPY;  Service: Endoscopy;  Laterality: N/A; 02/18/2020: ESOPHAGOGASTRODUODENOSCOPY (EGD) WITH PROPOFOL; N/A     Comment:  Procedure: ESOPHAGOGASTRODUODENOSCOPY (EGD) WITH               PROPOFOL;  Surgeon: Midge Minium, MD;  Location: [facility]               ENDOSCOPY;  Service: Endoscopy;  Laterality: N/A; 04/25/2020: ESOPHAGOGASTRODUODENOSCOPY (EGD) WITH PROPOFOL; N/A     Comment:  Procedure: ESOPHAGOGASTRODUODENOSCOPY (EGD) WITH               PROPOFOL;  Surgeon: Midge Minium, MD;  Location: [facility]               ENDOSCOPY;  Service: Endoscopy;  Laterality: N/A; 05/23/2020: ESOPHAGOGASTRODUODENOSCOPY (EGD) WITH PROPOFOL; N/A     Comment:  Procedure: ESOPHAGOGASTRODUODENOSCOPY (EGD) WITH               PROPOFOL;  Surgeon: Midge Minium, MD;  Location: [facility]               ENDOSCOPY;  Service: Endoscopy;  Laterality: N/A; 09/10/2022: ESOPHAGOGASTRODUODENOSCOPY (EGD) WITH PROPOFOL; N/A     Comment:  Procedure: ESOPHAGOGASTRODUODENOSCOPY (EGD) WITH               PROPOFOL;  Surgeon: Wyline Mood, MD;  Location: [facility]               ENDOSCOPY;  Service: Gastroenterology;  Laterality: N/A; 10/04/2021: EUS; N/A     Comment:  Procedure: LOWER ENDOSCOPIC ULTRASOUND (EUS);  Surgeon:               Doren Custard, MD;  Location: [facility] ENDOSCOPY;                Service: Gastroenterology;  Laterality: N/A;  LAB [facility] No date: EYE SURGERY 09/03/2018: FINGER ARTHROSCOPY WITH CARPOMETACARPEL ([facility])  ARTHROPLASTY;  Right     Comment:  Procedure: CARPOMETACARPEL [facility]) ARTHROPLASTY RIGHT               THUMB;   Surgeon: Kennedy Bucker, MD;  Location: [facility] ORS;               Service: Orthopedics;  Laterality: Right; 09/14/2021: FLEXIBLE SIGMOIDOSCOPY; N/A     Comment:  Procedure: FLEXIBLE SIGMOIDOSCOPY;  Surgeon: Midge Minium, MD;  Location: [facility] ENDOSCOPY;  Service:               Endoscopy;  Laterality: N/A;  No anesthesia 09/03/2018: GANGLION CYST EXCISION; Right     Comment:  Procedure: REMOVAL GANGLION OF WRIST;  Surgeon: Kennedy Bucker, MD;  Location: [facility] ORS;  Service: Orthopedics;               Laterality: Right; 09/03/2018: HARDWARE REMOVAL; Right     Comment:  Procedure: HARDWARE REMOVAL RIGHT THUMB;  Surgeon: Kennedy Bucker, MD;  Location: [facility] ORS;  Service: Orthopedics;               Laterality: Right;  staple removed 09/10/2022: HEMOSTASIS CLIP PLACEMENT     Comment:  Procedure: HEMOSTASIS CLIP PLACEMENT;  Surgeon: Wyline Mood, MD;  Location: [facility] ENDOSCOPY;  Service:               Gastroenterology;; 09/10/2022: HEMOSTASIS CONTROL     Comment:  Procedure: HEMOSTASIS CONTROL;  Surgeon: Wyline Mood,               MD;  Location: [facility] ENDOSCOPY;  Service:               Gastroenterology;; No date: JOINT REPLACEMENT; Left     Comment:  TKR No date: left sinusplasty  10/17/2015: MASTECTOMY, PARTIAL; Left     Comment:  Procedure: MASTECTOMY PARTIAL REVISION;  Surgeon: Nadeen Landau, MD;  Location: [facility] ORS;  Service: General;              Laterality: Left; 03/23/2021: PACEMAKER IMPLANT; N/A     Comment:  Procedure: PACEMAKER IMPLANT;  Surgeon: Lanier Prude, MD;  Location: [facility] INVASIVE CV LAB;  Service:               Cardiovascular;  Laterality: N/A; 09/29/2015: PARTIAL MASTECTOMY WITH NEEDLE LOCALIZATION; Left     Comment:  Procedure: PARTIAL MASTECTOMY WITH NEEDLE LOCALIZATION;               Surgeon: Nadeen Landau, MD;  Location: [facility] ORS;                Service: General;  Laterality:  Left; 09/10/2022: POLYPECTOMY     Comment:  Procedure: POLYPECTOMY;  Surgeon: Wyline Mood, MD;                Location: [facility] ENDOSCOPY;  Service: Gastroenterology;; 10/05/2021: RECTAL EXAM UNDER ANESTHESIA; N/A     Comment:  Procedure: RECTAL EXAM UNDER ANESTHESIA, ASPIRATION OF  RECTAL CYST;  Surgeon: Carolan Shiver, MD;                Location: [facility] ORS;  Service: General;  Laterality: N/A; No date: TOTAL ABDOMINAL HYSTERECTOMY W/ BILATERAL SALPINGOOPHORECTOMY  BMI    Body Mass Index: 25.15 kg/m      Reproductive/Obstetrics negative OB ROS                             Anesthesia Physical Anesthesia Plan  ASA: 3  Anesthesia Plan: General   Post-op Pain Management: Minimal or no pain anticipated   Induction: Intravenous  PONV Risk Score and Plan: 3 and Propofol infusion, TIVA and Ondansetron  Airway Management Planned: Nasal Cannula  Additional Equipment: None  Intra-op Plan:   Post-operative Plan:   Informed Consent: I have reviewed the patients History and Physical, chart, labs and discussed the procedure including the risks, benefits and alternatives for the proposed anesthesia with the patient or authorized representative who has indicated his/her understanding and acceptance.     Dental advisory given  Plan Discussed with: CRNA and Surgeon  Anesthesia Plan Comments: (Discussed risks of anesthesia with patient, including possibility of difficulty with spontaneous ventilation under anesthesia necessitating airway intervention, PONV, and rare risks such as cardiac or respiratory or neurological events, and allergic reactions. Discussed the role of CRNA in patient's perioperative care. Patient understands.)       Anesthesia Quick Evaluation

## 2023-01-29 NOTE — Assessment & Plan Note (Addendum)
-   The patient  will be placed on supplemental coverage with NovoLog. - We will place her her basal coverage at half  her home dose for now.

## 2023-01-29 NOTE — Assessment & Plan Note (Signed)
The potassium will be replaced and magnesium level will be checked.

## 2023-01-29 NOTE — Assessment & Plan Note (Signed)
-   The patient be admitted to a progressive unit bed. - We will follow serial hemoglobins and hematocrits. - We will continue IV Protonix drip and octreotide. - GI consult will be obtained. - I notified Dr. Mia Creek about the patient.

## 2023-01-29 NOTE — Assessment & Plan Note (Signed)
-   This is likely hypovolemic. - Will hydrate with IV normal saline with added potassium chloride and follow BMP.

## 2023-01-29 NOTE — ED Notes (Signed)
Pt complaining of abdominal pain. Primary RN notified

## 2023-01-29 NOTE — ED Notes (Signed)
Pt taken to Endo  

## 2023-01-29 NOTE — Assessment & Plan Note (Signed)
-   We will continue PPI therapy 

## 2023-01-29 NOTE — Care Plan (Signed)
Called for severe pain after procedure and passing stool. She had brown solid stool in her pad. Suspect the pain is gas pin from insufflation. Seems  Will order one time dose of tramadol. Would avoid opioids given history of MASLD cirrhosis.  Merlyn Lot MD, MPH

## 2023-01-29 NOTE — Progress Notes (Signed)
  PROGRESS NOTE    Annette Foster  ZHY:[id number] DOB: 09-25-1940 DOA: 01/28/2023 PCP: Jaclyn Shaggy, MD  [id number]  LOS: 1 day   Brief hospital course:   Assessment & Plan: Annette Foster is a 82 y.o. female with medical history significant for type 2 diabetes mellitus, CHF, GERD, hypertension, dyslipidemia, hypothyroidism, IBS and OSA on CPAP, who presented to the emergency room with acute onset of melanotic stools yesterday and bright red bleeding per rectum on the day of presentation.    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 Treated with argon plasma coagulation ( APC).  No signs of recent variceal bleeding. Plan: --cont IV PPI --cont octreotide gtt, per GI rec --cont nadolol and abx --clear liquid diet for now --monitor for a couple of days to ensure no recurrent bleeding  --need a repeat EGD as an outpatient for possible repeat banding   Possible pneumonia (HCC) --pt started Augmentin on 12/13 and doxycycline a week ago as outpatient. --CXR showed slightly increased airspace opacities in the right middle lobe.  Started on ceftriaxone and azithromycin on presentation.  Plan: --cont ceftriaxone (for both GI and lung) and azithromycin for now.  Sepsis ruled out --did not meet criteria.  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  Pancytopenia --followed by Dr. Donneta Romberg as outpatient.  Thought to be 2/2 cirrhosis/portal hypertension.  Currently WBC, Hgb and plt are stable at baseline. --outpatient f/u with hematology.  Hypokalemia --monitor and supplement PRN  Hyponatremia - This is likely hypovolemic.  Type 2 diabetes mellitus without complications (HCC) --recent A1c 6.7 --d/c BG checks, no need  Dyslipidemia - continue fenofibrate.  GERD without esophagitis - continue PPI therapy.   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 Treated with argon plasma coagulation ( APC).   Post procedure, pt complained of abdominal pain.     Objective: Vitals:   01/29/23 1333 01/29/23 1344 01/29/23 1415 01/29/23 1538  BP: (!) 103/50 (!) 113/58 116/72 111/61  Pulse: 66 66 66 66  Resp: 16 15 19 18   Temp:    98.1 F (36.7 C)  TempSrc:    Oral  SpO2: 100% 100% 98% 100%  Weight:      Height:        Intake/Output Summary (Last 24 hours) at 01/29/2023 1729 Last data filed at 01/29/2023 1228 Gross per 24 hour  Intake 300 ml  Output 0 ml  Net 300 ml   Filed Weights   01/28/23 1912  Weight: 63 kg    Examination:   Constitutional: NAD, AAOx3 HEENT: conjunctivae and lids normal, EOMI CV: No cyanosis.   RESP: normal respiratory effort, on RA Neuro: II - XII grossly intact.   Psych: Normal mood and affect.  Appropriate judgement and reason   Data Reviewed: I have personally reviewed labs and imaging studies  Time spent: 50 minutes  Darlin Priestly, MD [facility] If 7PM-7AM, please contact night-coverage 01/29/2023, 5:29 PM

## 2023-01-29 NOTE — Assessment & Plan Note (Signed)
Triglycerides are normal - Continue fenofibrate

## 2023-01-29 NOTE — ED Notes (Signed)
Pt up to use restroom, one person assist for support. Pt stating after she was done there was a little bit of blood in toilet. Pt returned to bed, connected to monitor.

## 2023-01-30 ENCOUNTER — Encounter: Payer: Self-pay | Admitting: Gastroenterology

## 2023-01-30 DIAGNOSIS — E871 Hypo-osmolality and hyponatremia: Secondary | ICD-10-CM | POA: Diagnosis not present

## 2023-01-30 DIAGNOSIS — K760 Fatty (change of) liver, not elsewhere classified: Secondary | ICD-10-CM

## 2023-01-30 DIAGNOSIS — K31811 Angiodysplasia of stomach and duodenum with bleeding: Secondary | ICD-10-CM | POA: Diagnosis not present

## 2023-01-30 DIAGNOSIS — E876 Hypokalemia: Secondary | ICD-10-CM | POA: Diagnosis not present

## 2023-01-30 DIAGNOSIS — K219 Gastro-esophageal reflux disease without esophagitis: Secondary | ICD-10-CM | POA: Diagnosis not present

## 2023-01-30 DIAGNOSIS — E119 Type 2 diabetes mellitus without complications: Secondary | ICD-10-CM

## 2023-01-30 LAB — BASIC METABOLIC PANEL
Anion gap: 7 (ref 5–15)
BUN: 15 mg/dL (ref 8–23)
CO2: 23 mmol/L (ref 22–32)
Calcium: 7.9 mg/dL — ABNORMAL LOW (ref 8.9–10.3)
Chloride: 105 mmol/L (ref 98–111)
Creatinine, Ser: 0.72 mg/dL (ref 0.44–1.00)
GFR, Estimated: >60 mL/min (ref >60)
Glucose, Bld: 88 mg/dL (ref 70–99)
Potassium: 3.4 mmol/L — ABNORMAL LOW (ref 3.5–5.1)
Sodium: 135 mmol/L (ref 135–145)

## 2023-01-30 LAB — GLUCOSE, CAPILLARY
Glucose-Capillary: 91 mg/dL (ref 70–99)
Glucose-Capillary: 112 mg/dL — ABNORMAL HIGH (ref 70–99)
Glucose-Capillary: 94 mg/dL (ref 70–99)
Glucose-Capillary: 132 mg/dL — ABNORMAL HIGH (ref 70–99)

## 2023-01-30 LAB — CBC
HCT: 24.7 % — ABNORMAL LOW (ref 36.0–46.0)
Hemoglobin: 8.3 g/dL — ABNORMAL LOW (ref 12.0–15.0)
MCH: 31 pg (ref 26.0–34.0)
MCHC: 33.6 g/dL (ref 30.0–36.0)
MCV: 92.2 fL (ref 80.0–100.0)
Platelets: 65 10*3/uL — ABNORMAL LOW (ref 150–400)
RBC: 2.68 MIL/uL — ABNORMAL LOW (ref 3.87–5.11)
RDW: 13.9 % (ref 11.5–15.5)
WBC: 1.5 10*3/uL — ABNORMAL LOW (ref 4.0–10.5)
nRBC: 0 % (ref 0.0–0.2)

## 2023-01-30 LAB — MAGNESIUM: Magnesium: 1.9 mg/dL (ref 1.7–2.4)

## 2023-01-30 MED ORDER — ORAL CARE MOUTH RINSE: 15.0000 mL | OROMUCOSAL | Status: DC | PRN

## 2023-01-30 MED ORDER — ALUM & MAG HYDROXIDE-SIMETH 200-200-20 MG/5ML PO SUSP: 30.0000 mL | Freq: Four times a day (QID) | ORAL | Status: DC | PRN

## 2023-01-30 MED ORDER — PROMETHAZINE (PHENERGAN) 6.25MG IN NS 50ML IVPB
6.2500 mg | Freq: Four times a day (QID) | INTRAVENOUS | Status: DC | PRN
  Administered 2023-01-30: 6.25 mg via INTRAVENOUS
  Filled 2023-01-30: qty 6.25

## 2023-01-30 NOTE — TOC Initial Note (Signed)
Transition of Care [facility]) - Initial/Assessment Note    Patient Details  Name: Annette Foster MRN: [id number] Date of Birth: 09/28/1940  Transition of Care [facility]) CM/SW Contact:    Margarito Liner, LCSW Phone Number: 01/30/2023, 11:41 AM  Clinical Narrative: Readmission prevention screen complete. CSW met with patient. No supports at bedside. CSW introduced role and explained that discharge planning would be discussed. PCP is Dewaine Oats, MD. Patient drives herself to appointments. [facility] [place]. No issues obtaining medications. Patient lives at home with her sister-in-law. Patient is her caregiver. Patient's husband is in a SNF in [place] and she goes there to check on him. She also provides care for her brother. No home health or DME use prior to admission. No further concerns. CSW encouraged patient to contact CSW as needed. CSW will continue to follow patient for support and facilitate return home once stable. Her daughter will transport her home at discharge.                 Expected Discharge Plan: Home/Self Care Barriers to Discharge: Continued Medical Work up   Patient Goals and CMS Choice            Expected Discharge Plan and Services     Post Acute Care Choice: NA Living arrangements for the past 2 months: Single Family Home                                      Prior Living Arrangements/Services Living arrangements for the past 2 months: Single Family Home Lives with:: Relatives Patient language and need for interpreter reviewed:: Yes Do you feel safe going back to the place where you live?: Yes      Need for Family Participation in Patient Care: Yes (Comment) Care giver support system in place?: Yes (comment)   Criminal Activity/Legal Involvement Pertinent to Current Situation/Hospitalization: No - Comment as needed  Activities of Daily Living   ADL Screening (condition at time of admission) Independently performs ADLs?: Yes  (appropriate for developmental age) Is the patient deaf or have difficulty hearing?: No Does the patient have difficulty seeing, even when wearing glasses/contacts?: No Does the patient have difficulty concentrating, remembering, or making decisions?: No  Permission Sought/Granted                  Emotional Assessment Appearance:: Appears stated age Attitude/Demeanor/Rapport: Engaged, Gracious Affect (typically observed): Accepting, Appropriate, Calm, Pleasant Orientation: : Oriented to Self, Oriented to Place, Oriented to  Time, Oriented to Situation Alcohol / Substance Use: Not Applicable Psych Involvement: No (comment)  Admission diagnosis:  GI bleeding [K92.2] Acute GI bleeding [K92.2] Patient Active Problem List   Diagnosis Date Noted   Sepsis due to pneumonia (HCC) 01/29/2023   GERD without esophagitis 01/29/2023   Dyslipidemia 01/29/2023   Type 2 diabetes mellitus without complications (HCC) 01/29/2023   Hypokalemia 01/29/2023   Hyponatremia 01/29/2023   GI bleeding 01/28/2023   H/O atrioventricular nodal ablation 11/13/2022   Esophageal varices without bleeding (HCC) 10/17/2022   Aspiration pneumonia (HCC) 09/11/2022   Adenomatous polyp of colon 09/10/2022   Lower GI bleed 09/08/2022   Leg pain 07/18/2022   Varicose veins with inflammation 07/17/2022   Unspecified injury of head, initial encounter 09/14/2021   Rectal lesion    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   Atrial fibrillation (HCC) 05/17/2021   Pacemaker 03/23/2021   Portal hypertension (  HCC)    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 bleeding (HCC)    Chronic heart failure with preserved ejection fraction (HFpEF) (HCC) 04/05/2020   Hepatic cirrhosis (HCC) 04/05/2020   Abnormal EKG 04/05/2020   Allergic rhinitis due to pollen 02/24/2020   Chronic allergic conjunctivitis 02/24/2020   Allergic rhinitis due to animal (cat) (dog) hair and dander 02/24/2020   Allergic rhinitis 02/24/2020   Polyp of  transverse colon    Hematochezia 02/17/2020   Chronic anticoagulation 02/17/2020   Anasarca 02/17/2020   Symptomatic sinus bradycardia 02/17/2020   Paroxysmal atrial fibrillation (HCC) 02/17/2020   AKI (acute kidney injury) (HCC) 02/17/2020   History of colitis 02/17/2020   Acute GI bleeding    Atrial fibrillation with RVR (HCC) 02/07/2020   NASH (nonalcoholic steatohepatitis)    Hav (hallux abducto valgus), unspecified laterality 06/03/2019   Acquired bilateral hammer toes 06/03/2019   Iron deficiency 04/26/2019   Right-sided chest wall pain 04/12/2019   Mid back pain on right side 04/12/2019   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 11/03/2017   Recurrent UTI (urinary tract infection) 07/23/2017   Nausea vomiting and diarrhea 01/11/2017   Hypothyroidism 01/11/2017   HTN (hypertension) 01/11/2017   Diabetes (HCC) 01/11/2017   GERD (gastroesophageal reflux disease) 01/11/2017   Other pancytopenia (HCC) 11/14/2016   Monilial vulvitis 11/06/2016   Spongiotic psoriasiform dermatitis 10/08/2016   Status post hysterectomy 06/20/2016   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 [place] [address] Phone: [phone] Fax: [phone]     Social Drivers of Health (SDOH) Social History: SDOH Screenings   Food Insecurity: No Food Insecurity (01/29/2023)  Housing: Low Risk  (01/29/2023)  Transportation Needs: No Transportation Needs (01/29/2023)  Utilities: Not At Risk (01/29/2023)  Physical Activity: Unknown (01/30/2018)   Received from [facility]  Tobacco Use: Low Risk  (01/29/2023)   SDOH Interventions:     Readmission Risk Interventions     No data to display

## 2023-01-30 NOTE — Plan of Care (Signed)

## 2023-01-30 NOTE — Progress Notes (Signed)
Pt refused Semglee. Pt's blood glucose was 132. Pt stated that 132 was "too low" for night time Semglee. Provided Pt with education.

## 2023-01-30 NOTE — Progress Notes (Signed)
Progress Note   Patient: Annette Foster OZH:[id number] DOB: March 23, 1940 DOA: 01/28/2023     2 DOS: the patient was seen and examined on 01/30/2023   Brief hospital course: Annette Foster is a 82 y.o. female with medical history significant for type 2 diabetes mellitus, CHF, GERD, hypertension, dyslipidemia, hypothyroidism, IBS and OSA on CPAP, who presented to the emergency room with acute onset of melanotic stools yesterday and bright red bleeding per rectum on the day of presentation.   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 Treated with argon plasma coagulation ( APC).  No signs of recent variceal bleeding. Will continue IV PPI Discontinue octreotide gtt, per GI rec Continue nadolol and abx She does have nausea vomiting. Continue clear liquid diet for now Monitor for recurrent bleeding  She will need to f/u with GI for repeat EGD as an outpatient for possible repeat banding procedure. She understands.   Possible pneumonia (HCC) She was on Augmentin on 12/13 and doxycycline a week ago as outpatient. Chest xray with increased airspace opacities in the right middle lobe.  Continue ceftriaxone (for both GI and lung) and azithromycin for now.  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 Held aldactone, nodalol as BP lower side. Advised GI follow up.  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   Pancytopenia Secondary to cirrhosis/portal hypertension.   Followed by Dr. Donneta Romberg as outpatient.   Currently WBC, Hgb and plt are stable at baseline. Outpatient f/u with hematology suggested.   Hypokalemia K 3.4 today. Monitor and supplement PRN   Hyponatremia Due to hypovolemic. Stable, Na 135 today.   Type 2 diabetes mellitus without complications (HCC) Recent A1c 6.7 Sugars stable. She is eating poor. Hold  accucheks   Dyslipidemia Continue fenofibrate.       Out of bed to chair. Incentive spirometry. Nursing supportive care. Fall, aspiration precautions. DVT prophylaxis scd   Code Status: Full Code  Subjective: Patient is seen and examined today morning. She had 2 episodes of non bloody vomiting per RN. Has abdominal distention, discomfort. Asks about discharge as she wants to visit her sick husband. Advised to stay another day.  Physical Exam: Vitals:   01/29/23 2024 01/30/23 0407 01/30/23 0850 01/30/23 1457  BP: 119/60 92/64 105/60 (!) 108/55  Pulse: 76 76 75 76  Resp: 16 18 16 17   Temp: 97.9 F (36.6 C) 97.7 F (36.5 C) (!) 97.5 F (36.4 C) 98 F (36.7 C)  TempSrc: Oral Oral Oral Oral  SpO2: 94% 90% 97% 92%  Weight:      Height:        General - Elderly Caucasian female, in distress due to nausea HEENT - PERRLA, EOMI, atraumatic head, non tender sinuses. Lung - Clear, no rales, rhonchi, wheezes. Heart - S1, S2 heard, no murmurs, rubs, trace pedal edema. Abdomen - Soft, non tender, distended, bowel sounds good Neuro - Alert, awake and oriented x 3, non focal exam. Skin - Warm and dry.  Data Reviewed:      Latest Ref Rng & Units 01/30/2023    7:36 AM 01/29/2023   10:39 AM 01/29/2023    4:38 AM  CBC  WBC 4.0 - 10.5 K/uL 1.5   2.0   Hemoglobin 12.0 - 15.0 g/dL 8.3  8.7  8.9   Hematocrit 36.0 - 46.0 % 24.7  26.4  25.7   Platelets 150 -  400 K/uL 65   70       Latest Ref Rng & Units 01/30/2023    7:36 AM 01/29/2023    4:38 AM 01/28/2023    7:16 PM  BMP  Glucose 70 - 99 mg/dL 88  952  841   BUN 8 - 23 mg/dL 15  20  19    Creatinine 0.44 - 1.00 mg/dL 3.24  4.01  0.27   Sodium 135 - 145 mmol/L 135  135  133   Potassium 3.5 - 5.1 mmol/L 3.4  3.4  3.1   Chloride 98 - 111 mmol/L 105  99  96   CO2 22 - 32 mmol/L 23  25  26    Calcium 8.9 - 10.3 mg/dL 7.9  8.3  8.5    DG Chest 1 View Result Date: 01/29/2023 CLINICAL DATA:  Pneumonia EXAM: CHEST  1 VIEW  COMPARISON:  01/21/2023 FINDINGS: Slightly increased airspace opacities in the right middle lobe compared to 01/21/2023. Question new opacities in the left mid and lower lung. No pleural effusion or pneumothorax. Stable cardiomediastinal silhouette. Aortic atherosclerotic calcification. Right chest wall pacemaker. IMPRESSION: Slightly increased airspace opacities in the right middle lobe compared to 01/21/2023. Question new opacities in the left mid and lower lung. Follow-up in 6-8 weeks after treatment is recommended to ensure resolution. Electronically Signed   By: Minerva Fester M.D.   On: 01/29/2023 00:00   Family Communication: Discussed with patient, she understand and agree. All questions answereed.  Disposition: Status is: Inpatient Remains inpatient appropriate because: nausea, vomiting, unable to tolerate diet  Planned Discharge Destination: Home with Home Health     Time spent: 37 minutes  Author: Marcelino Duster, MD 01/30/2023 4:48 PM Secure chat 7am to 7pm For on call review [email or web address].

## 2023-01-30 NOTE — Progress Notes (Signed)
BP 105/60, heart rate 75. Patient is scheduled to get nadolol and aldactone. MD said to hold these this a.m.

## 2023-01-30 NOTE — Progress Notes (Signed)
GI Inpatient Follow-up Note  Subjective:  Patient seen and has been having vomiting. No blood in the emesis.  Scheduled Inpatient Medications:   fenofibrate  160 mg Oral QHS   gabapentin  300 mg Oral QHS   insulin aspart  0-15 Units Subcutaneous TID WC   insulin aspart  0-5 Units Subcutaneous QHS   insulin glargine-yfgn  50 Units Subcutaneous QHS   levothyroxine  75 mcg Oral Q0600   nadolol  20 mg Oral Daily   pantoprazole (PROTONIX) IV  40 mg Intravenous Q8H   Followed by   Melene Muller ON 02/01/2023] pantoprazole (PROTONIX) IV  40 mg Intravenous Q12H   simvastatin  20 mg Oral QHS   spironolactone  12.5 mg Oral Daily   traMADol  50 mg Oral Once    Continuous Inpatient Infusions:    azithromycin Stopped (01/30/23 0240)   cefTRIAXone (ROCEPHIN)  IV Stopped (01/29/23 2248)   promethazine (PHENERGAN) injection (IM or IVPB) 6.25 mg (01/30/23 1424)    PRN Inpatient Medications:  acetaminophen **OR** acetaminophen, alum & mag hydroxide-simeth, morphine injection, ondansetron **OR** ondansetron (ZOFRAN) IV, mouth rinse, promethazine (PHENERGAN) injection (IM or IVPB), traMADol, traZODone  Review of Systems:  Review of Systems  Constitutional:  Positive for malaise/fatigue. Negative for chills and fever.  Respiratory:  Negative for shortness of breath.   Cardiovascular:  Negative for chest pain.  Gastrointestinal:  Positive for vomiting. Negative for blood in stool and melena.  Skin:  Negative for itching and rash.  Neurological:  Negative for focal weakness.  Psychiatric/Behavioral:  Negative for substance abuse.   All other systems reviewed and are negative.     Physical Examination: BP (!) 108/55 (BP Location: Right Arm)   Pulse 76   Temp 98 F (36.7 C) (Oral)   Resp 17   Ht 5\' 3"  (1.6 m)   Wt 63 kg   LMP  (LMP Unknown)   SpO2 92%   BMI 24.62 kg/m  Gen: NAD, alert and oriented x 4 HEENT: PEERLA, EOMI, Neck: supple, no JVD or thyromegaly Chest: No respiratory  distress Abd: soft, slightly tender Ext: trace edema, well perfused with 2+ pulses, Skin: no rash or lesions noted Lymph: no LAD  Data: Lab Results  Component Value Date   WBC 1.5 (L) 01/30/2023   HGB 8.3 (L) 01/30/2023   HCT 24.7 (L) 01/30/2023   MCV 92.2 01/30/2023   PLT 65 (L) 01/30/2023   Recent Labs  Lab 01/29/23 0438 01/29/23 1039 01/30/23 0736  HGB 8.9* 8.7* 8.3*   Lab Results  Component Value Date   NA 135 01/30/2023   K 3.4 (L) 01/30/2023   CL 105 01/30/2023   CO2 23 01/30/2023   BUN 15 01/30/2023   CREATININE 0.72 01/30/2023   Lab Results  Component Value Date   ALT 14 01/28/2023   AST 30 01/28/2023   ALKPHOS 55 01/28/2023   BILITOT 1.1 01/28/2023   Recent Labs  Lab 01/29/23 0438  APTT 37*  INR 1.5*   Assessment/Plan: Ms. Battenfield is a 82 y.o. lady with history of MASLD cirrhosis here with melena and hematochezia with recent history of banded esophageal varices. EGD showed bleeding duodenal AVM.  Recommendations:  - given no bleeding varices, will dc octreotide as it can cause vomiting and diarrhea - continue antibiotics to complete a 7 day course - daily CMP and INR - monitor for any GI bleeding - maintain active type and screen - transfuse if hemoglobin < 7 - will need outpatient GI f/u -  will advance diet as she thinks she will tolerate something with more substance better - prn anti-emetics  Dr. Timothy Lasso will be covering starting tomorrow, will continue to follow  Merlyn Lot MD, MPH [facility]

## 2023-01-31 DIAGNOSIS — E876 Hypokalemia: Secondary | ICD-10-CM | POA: Diagnosis not present

## 2023-01-31 DIAGNOSIS — E119 Type 2 diabetes mellitus without complications: Secondary | ICD-10-CM | POA: Diagnosis not present

## 2023-01-31 DIAGNOSIS — E871 Hypo-osmolality and hyponatremia: Secondary | ICD-10-CM | POA: Diagnosis not present

## 2023-01-31 DIAGNOSIS — K31819 Angiodysplasia of stomach and duodenum without bleeding: Secondary | ICD-10-CM

## 2023-01-31 DIAGNOSIS — Z794 Long term (current) use of insulin: Secondary | ICD-10-CM

## 2023-01-31 DIAGNOSIS — K2289 Other specified disease of esophagus: Secondary | ICD-10-CM

## 2023-01-31 DIAGNOSIS — K31811 Angiodysplasia of stomach and duodenum with bleeding: Secondary | ICD-10-CM | POA: Diagnosis not present

## 2023-01-31 LAB — CBC
HCT: 24.8 % — ABNORMAL LOW (ref 36.0–46.0)
Hemoglobin: 8.3 g/dL — ABNORMAL LOW (ref 12.0–15.0)
MCH: 30.7 pg (ref 26.0–34.0)
MCHC: 33.5 g/dL (ref 30.0–36.0)
MCV: 91.9 fL (ref 80.0–100.0)
Platelets: 64 10*3/uL — ABNORMAL LOW (ref 150–400)
RBC: 2.7 MIL/uL — ABNORMAL LOW (ref 3.87–5.11)
RDW: 13.7 % (ref 11.5–15.5)
WBC: 1.5 10*3/uL — ABNORMAL LOW (ref 4.0–10.5)
nRBC: 0 % (ref 0.0–0.2)

## 2023-01-31 LAB — GLUCOSE, CAPILLARY: Glucose-Capillary: 114 mg/dL — ABNORMAL HIGH (ref 70–99)

## 2023-01-31 LAB — BASIC METABOLIC PANEL
Anion gap: 7 (ref 5–15)
BUN: 13 mg/dL (ref 8–23)
CO2: 25 mmol/L (ref 22–32)
Calcium: 7.9 mg/dL — ABNORMAL LOW (ref 8.9–10.3)
Chloride: 104 mmol/L (ref 98–111)
Creatinine, Ser: 0.65 mg/dL (ref 0.44–1.00)
GFR, Estimated: >60 mL/min (ref >60)
Glucose, Bld: 96 mg/dL (ref 70–99)
Potassium: 3.5 mmol/L (ref 3.5–5.1)
Sodium: 136 mmol/L (ref 135–145)

## 2023-01-31 LAB — MAGNESIUM: Magnesium: 2.1 mg/dL (ref 1.7–2.4)

## 2023-01-31 MED ORDER — AMOXICILLIN-POT CLAVULANATE 875-125 MG PO TABS: 1.0000 | ORAL_TABLET | Freq: Two times a day (BID) | ORAL | 0 refills | Status: AC

## 2023-01-31 NOTE — Plan of Care (Signed)
  Problem: Education: Goal: Ability to describe self-care measures that may prevent or decrease complications (Diabetes Survival Skills Education) will improve Outcome: Adequate for Discharge Goal: Individualized Educational Video(s) Outcome: Adequate for Discharge   Problem: Coping: Goal: Ability to adjust to condition or change in health will improve Outcome: Adequate for Discharge   Problem: Fluid Volume: Goal: Ability to maintain a balanced intake and output will improve Outcome: Adequate for Discharge   Problem: Health Behavior/Discharge Planning: Goal: Ability to identify and utilize available resources and services will improve Outcome: Adequate for Discharge Goal: Ability to manage health-related needs will improve Outcome: Adequate for Discharge   Problem: Metabolic: Goal: Ability to maintain appropriate glucose levels will improve Outcome: Adequate for Discharge   Problem: Nutritional: Goal: Maintenance of adequate nutrition will improve Outcome: Adequate for Discharge Goal: Progress toward achieving an optimal weight will improve Outcome: Adequate for Discharge   Problem: Skin Integrity: Goal: Risk for impaired skin integrity will decrease Outcome: Adequate for Discharge   Problem: Tissue Perfusion: Goal: Adequacy of tissue perfusion will improve Outcome: Adequate for Discharge   Problem: Fluid Volume: Goal: Hemodynamic stability will improve Outcome: Adequate for Discharge   Problem: Clinical Measurements: Goal: Diagnostic test results will improve Outcome: Adequate for Discharge Goal: Signs and symptoms of infection will decrease Outcome: Adequate for Discharge   Problem: Respiratory: Goal: Ability to maintain adequate ventilation will improve Outcome: Adequate for Discharge   Problem: Education: Goal: Knowledge of General Education information will improve Description: Including pain rating scale, medication(s)/side effects and non-pharmacologic  comfort measures Outcome: Adequate for Discharge   Problem: Health Behavior/Discharge Planning: Goal: Ability to manage health-related needs will improve Outcome: Adequate for Discharge   Problem: Clinical Measurements: Goal: Ability to maintain clinical measurements within normal limits will improve Outcome: Adequate for Discharge Goal: Will remain free from infection Outcome: Adequate for Discharge Goal: Diagnostic test results will improve Outcome: Adequate for Discharge Goal: Respiratory complications will improve Outcome: Adequate for Discharge Goal: Cardiovascular complication will be avoided Outcome: Adequate for Discharge   Problem: Activity: Goal: Risk for activity intolerance will decrease Outcome: Adequate for Discharge   Problem: Nutrition: Goal: Adequate nutrition will be maintained Outcome: Adequate for Discharge   Problem: Coping: Goal: Level of anxiety will decrease Outcome: Adequate for Discharge   Problem: Elimination: Goal: Will not experience complications related to bowel motility Outcome: Adequate for Discharge Goal: Will not experience complications related to urinary retention Outcome: Adequate for Discharge   Problem: Pain Management: Goal: General experience of comfort will improve Outcome: Adequate for Discharge   Problem: Safety: Goal: Ability to remain free from injury will improve Outcome: Adequate for Discharge   Problem: Skin Integrity: Goal: Risk for impaired skin integrity will decrease Outcome: Adequate for Discharge

## 2023-01-31 NOTE — Care Management Important Message (Signed)
Important Message  Patient Details  Name: Annette Foster MRN: [id number] Date of Birth: 1940/09/14   Important Message Given:  Yes - Medicare IM     Bernadette Hoit 01/31/2023, 10:19 AM

## 2023-01-31 NOTE — Discharge Summary (Signed)
Physician Discharge Summary   Patient: Annette Foster MRN: [id number] DOB: [birth year]  Admit date:     01/28/2023  Discharge date: 01/31/23  Discharge Physician: Marcelino Duster   PCP: Jaclyn Shaggy, MD   Recommendations at discharge:  {Tip this will not be part of the note when signed- Example include specific recommendations for outpatient follow-up, pending tests to follow-up on. (Optional):26781}  PCP follow up in 1 week. GI follow up as scheduled.  Discharge Diagnoses: Principal Problem:   GI bleeding Active Problems:   Sepsis due to pneumonia (HCC)   Hypokalemia   Hyponatremia   GERD without esophagitis   Dyslipidemia   Type 2 diabetes mellitus without complications (HCC)   Non-alcoholic fatty liver disease  Resolved Problems:   * No resolved hospital problems. [facility] Course: No notes on file  Assessment and Plan: * GI bleeding - The patient be admitted to a progressive unit bed. - We will follow serial hemoglobins and hematocrits. - We will continue IV Protonix drip and octreotide. - GI consult will be obtained. - I notified Dr. Mia Creek about the patient.  Sepsis due to pneumonia (HCC) - Sepsis manifested by leukopenia of 2.6 and tachypnea of 26 shortly upon admission. - The patient will be admitted to a medical telemetry bed. - Will continue antibiotic therapy with IV Rocephin and Zithromax. - Mucolytic therapy be provided as well as duo nebs q.i.d. and q.4 hours p.r.n. - We will follow blood cultures.   Hypokalemia The potassium will be replaced and magnesium level will be checked.  Hyponatremia - This is likely hypovolemic. - Will hydrate with IV normal saline with added potassium chloride and follow BMP.  Type 2 diabetes mellitus without complications (HCC) - The patient  will be placed on supplemental coverage with NovoLog. - We will place her her basal coverage at half  her home dose for now.  Dyslipidemia - We will continue  fenofibrate.  GERD without esophagitis - We will continue PPI therapy.      {Tip this will not be part of the note when signed Body mass index is 24.62 kg/m. , ,  (Optional):26781}  {(NOTE) Pain control PDMP Statment (Optional):26782} Consultants: *** Procedures performed: ***  Disposition: {Plan; Disposition:26390} Diet recommendation:  Discharge Diet Orders (From admission, onward)     Start     Ordered   01/31/23 0000  Diet - low sodium heart healthy        01/31/23 1222   01/31/23 0000  Diet Carb Modified        01/31/23 1222           {Diet_Plan:26776} DISCHARGE MEDICATION: Allergies as of 01/31/2023       Reactions   Codeine Itching, Nausea And Vomiting, Other (See Comments)   Demeclocycline Rash   Demerol [meperidine] Itching, Nausea And Vomiting   Hydrocodone Itching, Nausea And Vomiting   Other Other (See Comments)   Oxycodone Itching, Nausea And Vomiting   Pentazocine Itching, Nausea And Vomiting, Other (See Comments)   Tetracyclines & Related Rash   Coal Tar Extract Other (See Comments)   Hydrocodone-acetaminophen Other (See Comments)   Salicylic Acid Other (See Comments)   Tetracycline Hcl Other (See Comments)   Fentanyl Itching, Nausea And Vomiting, Other (See Comments)   D/W surgical nurse on 7/23 and pt reportedly tolerated dose during surgery.  Dose was given by MD.  Pt reported that she only gets itching and did not object to receiving med  Medication List     STOP taking these medications    cefdinir 300 MG capsule Commonly known as: OMNICEF   doxycycline 100 MG capsule Commonly known as: VIBRAMYCIN   EPINEPHrine 0.3 mg/0.3 mL Soaj injection Commonly known as: EPI-PEN   valACYclovir 500 MG tablet Commonly known as: VALTREX       TAKE these medications    amoxicillin-clavulanate 875-125 MG tablet Commonly known as: AUGMENTIN Take 1 tablet by mouth 2 (two) times daily for 5 days.   Azelastine HCl 137 MCG/SPRAY  Soln INHALE 1-2 PUFFS IN EACH NOSTRIL NASALLY ONCE A DAY 30 DAY(S)   benzonatate 100 MG capsule Commonly known as: TESSALON Take 100 mg by mouth 3 (three) times daily as needed.   clobetasol ointment 0.05 % Commonly known as: TEMOVATE Apply 1 application topically as needed (vaginal irritation).   clotrimazole 1 % cream Commonly known as: LOTRIMIN Apply 1 Application topically 2 (two) times daily as needed.   cyanocobalamin 1000 MCG/ML injection Commonly known as: VITAMIN B12 1,000 mcg every 30 (thirty) days.   cyclobenzaprine 5 MG tablet Commonly known as: FLEXERIL Take 1 tablet by mouth 2 (two) times daily.   dicyclomine 10 MG capsule Commonly known as: BENTYL Take 10 mg by mouth in the morning, at noon, and at bedtime.   docusate sodium 100 MG capsule Commonly known as: COLACE Take 100 mg by mouth 3 (three) times daily as needed for moderate constipation.   esomeprazole 40 MG capsule Commonly known as: NEXIUM Take 40 mg by mouth 2 (two) times daily before a meal.   estradiol 0.1 MG/GM vaginal cream Commonly known as: ESTRACE Place 1 Applicatorful vaginally daily as needed (irritation).   fenofibrate 160 MG tablet Take 160 mg by mouth at bedtime.   gabapentin 300 MG capsule Commonly known as: NEURONTIN Take 300 mg by mouth at bedtime.   hydrOXYzine 25 MG tablet Commonly known as: ATARAX Take 25 mg by mouth every 6 (six) hours as needed for anxiety, itching, nausea or vomiting.   levocetirizine 5 MG tablet Commonly known as: XYZAL Take 5 mg by mouth every evening.   levothyroxine 75 MCG tablet Commonly known as: SYNTHROID Take 75 mcg by mouth daily before breakfast.   magnesium oxide 400 MG tablet Commonly known as: MAG-OX Take 400 mg by mouth 2 (two) times daily.   metoprolol tartrate 50 MG tablet Commonly known as: LOPRESSOR TAKE 1 TABLET 2 HR PRIOR TO CARDIAC PROCEDURE   Mounjaro 7.5 MG/0.5ML Pen Generic drug: tirzepatide Inject 12.5 mg into the  skin once a week.   nadolol 20 MG tablet Commonly known as: CORGARD Take 1 tablet (20 mg total) by mouth daily.   NONFORMULARY OR COMPOUNDED ITEM Nifedipine 0.3% plus lidocaine 2% cream apply to rectum BID and after every BM.   NovoLOG FlexPen 100 UNIT/ML FlexPen Generic drug: insulin aspart Inject 48-68 Units into the skin 3 (three) times daily with meals. Based on sliding scale   potassium chloride SA 20 MEQ tablet Commonly known as: KLOR-CON M Take 20 mEq by mouth 2 (two) times daily.   saccharomyces boulardii 250 MG capsule Commonly known as: FLORASTOR Take 250 mg by mouth 2 (two) times daily.   simvastatin 20 MG tablet Commonly known as: ZOCOR Take 20 mg by mouth at bedtime.   spironolactone 25 MG tablet Commonly known as: ALDACTONE TAKE 1/2 TABLET BY MOUTH DAILY   torsemide 20 MG tablet Commonly known as: DEMADEX TAKE 1 TABLET BY MOUTH EVERY DAY  Evaristo Bury FlexTouch 200 UNIT/ML FlexTouch Pen Generic drug: insulin degludec 194 Unit(s) SUB-Q Daily   Vitamin D 50 MCG (2000 UT) Caps Take 2,000 Units by mouth daily.        Follow-up Information     Jaclyn Shaggy, MD Follow up in 1 week(s).   Specialty: Internal Medicine Contact information: [address]   [place] [address] [phone]                Discharge Exam: Ceasar Mons Weights   01/28/23 1912  Weight: 63 kg   ***  Condition at discharge: {DC Condition:26389}  The results of significant diagnostics from this hospitalization (including imaging, microbiology, ancillary and laboratory) are listed below for reference.   Imaging Studies: DG Chest 1 View Result Date: 01/29/2023 CLINICAL DATA:  Pneumonia EXAM: CHEST  1 VIEW COMPARISON:  01/21/2023 FINDINGS: Slightly increased airspace opacities in the right middle lobe compared to 01/21/2023. Question new opacities in the left mid and lower lung. No pleural effusion or pneumothorax. Stable cardiomediastinal silhouette. Aortic  atherosclerotic calcification. Right chest wall pacemaker. IMPRESSION: Slightly increased airspace opacities in the right middle lobe compared to 01/21/2023. Question new opacities in the left mid and lower lung. Follow-up in 6-8 weeks after treatment is recommended to ensure resolution. Electronically Signed   By: Minerva Fester M.D.   On: 01/29/2023 00:00   DG Chest 2 View Result Date: 01/23/2023 CLINICAL DATA:  Cough EXAM: CHEST - 2 VIEW COMPARISON:  March 24, 2021 FINDINGS: The cardiomediastinal silhouette is unchanged in contour.RIGHT chest cardiac pacing device. Atherosclerotic calcifications. No pleural effusion. No pneumothorax. New patchy RIGHT middle lobe airspace opacities. Visualized abdomen is unremarkable. Mild degenerative changes of the thoracic spine. IMPRESSION: New patchy RIGHT middle lobe airspace opacities, concerning for pneumonia. Recommend follow-up PA and lateral chest radiograph in 4-6 weeks to ensure resolution. Electronically Signed   By: Meda Klinefelter M.D.   On: 01/23/2023 09:28    Microbiology: Results for orders placed or performed during the hospital encounter of 01/28/23  Culture, blood (x 2)     Status: None (Preliminary result)   Collection Time: 01/29/23  4:38 AM   Specimen: BLOOD RIGHT ARM  Result Value Ref Range Status   Specimen Description BLOOD RIGHT ARM  Final   Special Requests   Final    BOTTLES DRAWN AEROBIC AND ANAEROBIC Blood Culture adequate volume   Culture   Final    NO GROWTH 2 DAYS Performed at [facility], [address], [place], [place] [address]    Report Status PENDING  Incomplete  Culture, blood (x 2)     Status: None (Preliminary result)   Collection Time: 01/29/23  4:38 AM   Specimen: BLOOD RIGHT ARM  Result Value Ref Range Status   Specimen Description BLOOD RIGHT ARM  Final   Special Requests   Final    BOTTLES DRAWN AEROBIC AND ANAEROBIC Blood Culture adequate volume   Culture   Final    NO GROWTH 2  DAYS Performed at [facility], [address], [place], [place] [address]    Report Status PENDING  Incomplete  SARS Coronavirus 2 by RT PCR (hospital order, performed in [facility] hospital lab) *cepheid single result test* Anterior Nasal Swab     Status: None   Collection Time: 01/29/23 10:39 AM   Specimen: Anterior Nasal Swab  Result Value Ref Range Status   SARS Coronavirus 2 by RT PCR NEGATIVE NEGATIVE Final    Comment: (  NOTE) SARS-CoV-2 target nucleic acids are NOT DETECTED.  The SARS-CoV-2 RNA is generally detectable in upper and lower respiratory specimens during the acute phase of infection. The lowest concentration of SARS-CoV-2 viral copies this assay can detect is 250 copies / mL. A negative result does not preclude SARS-CoV-2 infection and should not be used as the sole basis for treatment or other patient management decisions.  A negative result may occur with improper specimen collection / handling, submission of specimen other than nasopharyngeal swab, presence of viral mutation(s) within the areas targeted by this assay, and inadequate number of viral copies (<250 copies / mL). A negative result must be combined with clinical observations, patient history, and epidemiological information.  Fact Sheet for Patients:   RoadLapTop.co.za  Fact Sheet for Healthcare Providers: http://kim-miller.com/  This test is not yet approved or  cleared by the [place] FDA and has been authorized for detection and/or diagnosis of SARS-CoV-2 by FDA under an Emergency Use Authorization (EUA).  This EUA will remain in effect (meaning this test can be used) for the duration of the COVID-19 declaration under Section 564(b)(1) of the Act, 21 U.S.C. section 360bbb-3(b)(1), unless the authorization is terminated or revoked sooner.  Performed at [facility], [address] [place], [place] [address]      Labs: CBC: Recent Labs  Lab 01/28/23 2109 01/28/23 2347 01/29/23 0438 01/29/23 1039 01/30/23 0736 01/31/23 0555  WBC 2.6*  --  2.0*  --  1.5* 1.5*  HGB 8.6* 8.6* 8.9* 8.7* 8.3* 8.3*  HCT 24.9* 25.0* 25.7* 26.4* 24.7* 24.8*  MCV 89.6  --  90.2  --  92.2 91.9  PLT 75*  --  70*  --  65* 64*   Basic Metabolic Panel: Recent Labs  Lab 01/28/23 1916 01/29/23 0438 01/30/23 0736 01/31/23 0555  NA 133* 135 135 136  K 3.1* 3.4* 3.4* 3.5  CL 96* 99 105 104  CO2 26 25 23 25   GLUCOSE 142* 144* 88 96  BUN 19 20 15 13   CREATININE 0.78 0.85 0.72 0.65  CALCIUM 8.5* 8.3* 7.9* 7.9*  MG  --   --  1.9 2.1   Liver Function Tests: Recent Labs  Lab 01/28/23 1916  AST 30  ALT 14  ALKPHOS 55  BILITOT 1.1  PROT 7.9  ALBUMIN 3.3*   CBG: Recent Labs  Lab 01/30/23 0851 01/30/23 1138 01/30/23 1802 01/30/23 2116 01/31/23 0806  GLUCAP 91 112* 94 132* 114*    Discharge time spent: 35 minutes.  Signed: Marcelino Duster, MD [facility] 01/31/2023

## 2023-01-31 NOTE — Progress Notes (Signed)
IV Removed, Discharge instructions completed. Patient wheeled to the medical mall exit to be discharged to the care of family in stable condition.  [name] Annette Foster

## 2023-01-31 NOTE — TOC Transition Note (Signed)
Transition of Care [facility]) - Discharge Note   Patient Details  Name: Annette Foster MRN: [id number] Date of Birth: Dec 20, 1940  [facility] CM/SW Contact:  Margarito Liner, LCSW Phone Number: 01/31/2023, 12:26 PM   Clinical Narrative:  Patient has orders to discharge home today. No further concerns. CSW signing off.   Final next level of care: Home/Self Care Barriers to Discharge: Barriers Resolved   Patient Goals and CMS Choice            Discharge Placement                Patient to be transferred to facility by: Daughter   Patient and family notified of of transfer: 01/31/23  Discharge Plan and Services Additional resources added to the After Visit Summary for       Post Acute Care Choice: NA                               Social Drivers of Health (SDOH) Interventions SDOH Screenings   Food Insecurity: No Food Insecurity (01/29/2023)  Housing: Low Risk  (01/29/2023)  Transportation Needs: No Transportation Needs (01/29/2023)  Utilities: Not At Risk (01/29/2023)  Physical Activity: Unknown (01/30/2018)   [facility] from [facility]  Tobacco Use: Low Risk  (01/29/2023)     Readmission Risk Interventions     No data to display

## 2023-02-03 LAB — CULTURE, BLOOD (ROUTINE X 2)
Culture: NO GROWTH
Culture: NO GROWTH
Special Requests: ADEQUATE
Special Requests: ADEQUATE

## 2023-02-10 ENCOUNTER — Ambulatory Visit
Admission: RE | Admit: 2023-02-10 | Discharge: 2023-02-10 | Disposition: A | Discharge status: Home / Self Care | Payer: Medicare PPO | Source: Ambulatory Visit | Attending: Internal Medicine | Admitting: Internal Medicine

## 2023-02-10 DIAGNOSIS — Z1231 Encounter for screening mammogram for malignant neoplasm of breast: Secondary | ICD-10-CM | POA: Insufficient documentation

## 2023-02-25 ENCOUNTER — Encounter: Payer: Self-pay | Admitting: Internal Medicine

## 2023-02-25 ENCOUNTER — Telehealth: Payer: Self-pay | Admitting: Medical

## 2023-02-25 NOTE — Telephone Encounter (Signed)
 Pt made aware of below recommendations and stated she take torsemide  20 mg daily. Cadence made aware and recommended pt take an extra 20 mg today. Pt verbalized understanding.   Furth, Cadence H, PA-C  You5 minutes ago (11:39 AM)    It appears the patient takes torsemide  20mg  every other day. She can take an extra torsemide  to see if this improves the swelling.

## 2023-02-25 NOTE — Addendum Note (Signed)
 Addended by: Parke Poisson on: 02/25/2023 11:52 AM   Modules accepted: Orders

## 2023-02-25 NOTE — Telephone Encounter (Addendum)
 Patient called to report bilateral leg, feet, and abdominal swelling that started over the weekend. She also reports a 4-pound weight increase since Sunday. The patient denies shortness of breath or any other symptoms.  The patient has an appointment for tomorrow, 02/26/23. The nurse recommended the patient keep the appointment and elevate her legs in the meantime. The patient was made aware of emergency department precautions should any new symptoms develop or worsen.  Will forward to PA for any further recommendations

## 2023-02-25 NOTE — Telephone Encounter (Signed)
 Pt c/o swelling/edema: STAT if pt has developed SOB within 24 hours  If swelling, where is the swelling located? Legs and stomach  How much weight have you gained and in what time span? 4 lbs since Sunday(02-23-23)  Have you gained 2 pounds in a day or 5 pounds in a week?  Gained 4 lbs in 2 days  Do you have a log of your daily weights (if so, list)?   Are you currently taking a fluid pill? yes  Are you currently SOB? no  Have you traveled recently in a car or plane for an extended period of time? No- patient wanted to be seen- I made her an appointment for tomorrow with Cadence(02-26-23) please call to evaluate

## 2023-02-26 ENCOUNTER — Encounter: Payer: Self-pay | Admitting: Medical

## 2023-02-26 ENCOUNTER — Ambulatory Visit: Payer: Medicare PPO | Attending: Medical | Admitting: Medical

## 2023-02-26 VITALS — BP 106/52 | HR 60 | Ht 63.0 in | Wt 145.5 lb

## 2023-02-26 DIAGNOSIS — I495 Sick sinus syndrome: Secondary | ICD-10-CM | POA: Diagnosis not present

## 2023-02-26 DIAGNOSIS — I059 Rheumatic mitral valve disease, unspecified: Secondary | ICD-10-CM | POA: Diagnosis not present

## 2023-02-26 DIAGNOSIS — Z950 Presence of cardiac pacemaker: Secondary | ICD-10-CM | POA: Diagnosis not present

## 2023-02-26 DIAGNOSIS — I480 Paroxysmal atrial fibrillation: Secondary | ICD-10-CM | POA: Diagnosis not present

## 2023-02-26 DIAGNOSIS — K7460 Unspecified cirrhosis of liver: Secondary | ICD-10-CM

## 2023-02-26 DIAGNOSIS — I517 Cardiomegaly: Secondary | ICD-10-CM

## 2023-02-26 DIAGNOSIS — Z8719 Personal history of other diseases of the digestive system: Secondary | ICD-10-CM

## 2023-02-26 NOTE — Progress Notes (Signed)
 Cardiology Office Note:    Date:  02/26/2023   ID:  Annette Foster, DOB August 16, 1940, MRN [id number]  PCP:  Westley Hammers, MD  [facility] Cardiologist:  Sammy Crisp, MD  [facility] Electrophysiologist:  Boyce Byes, MD   Referring MD: Westley Hammers, MD   Chief Complaint: lower leg edema  History of Present Illness:    Annette Foster is a 83 y.o. female with a hx of remote valve surgery in the 1970s, HFpEF, paroxysmal afib complicated by GIB not on a/c, HTN, HLD, NASH, seizure disorder, OSA, left breast cancer s/p mastectomy who presents for follow-up.   Seen by EP for watchman, however left atrial appendage morphology precludes watchman placement. Anticoagulation was not pursued given h/o GIB with esophageal varices.    The patient was referred back to EP for AV junction ablation and  permanent pacemaker. When in afib patient very symptomatic with RVR, when in NSR she is brardycardic. She underwent device implant 03/23/21. Patient underwent AV junction ablation 8 weeks later.  She was not felt to be a good candidate for watchman due to inappropriate left atrial appendage anatomy.   The patient was admitted in July 2024 for Acute GI bleed.  She woke up noting spontaneous rectal bleeding.  She was admitted to the hospital and seen by GI.  She underwent EGD and colonoscopy.  EGD showed portal hypertension gastropathy, grade 3 esophageal varices.  Colonoscopy showed 2 polyps which were resected the descending colon and 1 polyp in ascending colon resected.  GI recommended IV octreotide  drip for 72 hours and empiric antibiotics for 5 days.    Patient was last seen in August 2024 reporting she needed another EGD.  She reported episode of chest pain and shortness of breath so echo and cardiac CTA were ordered. Patient was unable to do cardiac CTA due to inability to lower HR with low BP. Echo showed LVEF 55-60%, G2Dd, severely dilated LA, mild MR with normally functioning  valve.    Patient was last seen in October 2024 for preop for repeat EGD.  No changes were made.  Cardiac MRI October 2024 showed no evidence of infiltrative disease, basal septal hypertrophy of aging versus nonobstructive hypertrophic cardiomyopathy with no high risk features, severe left atrial dilation, mild tricuspid regurgitation around device lead, mitral valve repair.  Patient was admitted in mid December 2024 for acute GI bleeding, sepsis due to pneumonia, hypokalemia, hyponatremia.  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  Patient called in 02/25/2023 reporting lower leg edema she was instructed to take an extra torsemide .  Today, she says the extra torsemide  improved her swelling. It started over the weekend. She denies SOB or chest pain. EKG shows V paced with underlying aflutter. She is not on a/c due to GIB. cMRI was reviewed.   Past Medical History:  Diagnosis Date   Abdominal pain    Allergy    Cancer (HCC) 2017   breast cancer- Left   Cataract    CHF (congestive heart failure) (HCC)    Collagenous colitis    Coronary artery disease    Diabetes mellitus without complication (HCC)    Diarrhea    Diverticulosis    Dysrhythmia    Fatty liver disease, nonalcoholic    Fibrocystic breast    GERD (gastroesophageal reflux disease)    Heart murmur    Hyperlipidemia    Hypertension    Hypothyroidism  IBS (irritable bowel syndrome)    IDA (iron  deficiency anemia)    Liver cirrhosis (HCC)    Personal history of radiation therapy 2017   LEFT BREAST CA   PONV (postoperative nausea and vomiting)    Presence of permanent cardiac pacemaker    Sleep apnea    C-Pap    Past Surgical History:  Procedure Laterality Date   ABDOMINAL HYSTERECTOMY     tah bso   ABDOMINAL SURGERY     APPENDECTOMY     AV NODE ABLATION N/A 05/17/2021   Procedure: AV NODE ABLATION;  Surgeon: Boyce Byes, MD;  [facility];  Service: Cardiovascular;  Laterality: N/A;   BREAST BIOPSY Bilateral 2016   negative   BREAST 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/2015   DCIS, papillary carcinoma in situ   BREAST BIOPSY Left 05/27/2016   BENIGN MAMMARY EPITHELIUM   BREAST BIOPSY Left 11/20/2017   affirm bx x clip BENIGN MAMMARY EPITHELIUM CONSISTENT WITH RAD THERAPY   BREAST EXCISIONAL BIOPSY Right    NEG 1980's   BREAST LUMPECTOMY Left 10/17/2015   DCIS and papillary carcinoma insitu, clear margins   CARDIAC SURGERY     has replacement valve   CATARACT EXTRACTION Right    CATARACT EXTRACTION W/PHACO Left 01/16/2021   Procedure: CATARACT EXTRACTION PHACO AND INTRAOCULAR LENS PLACEMENT (IOC) LEFT DIABETIC 8.46 00:59.8;  Surgeon: Clair Crews, MD;  Location: [facility];  Service: Ophthalmology;  Laterality: Left;   CHOLECYSTECTOMY     COLONOSCOPY WITH PROPOFOL  N/A 03/11/2016   Procedure: COLONOSCOPY WITH PROPOFOL ;  Surgeon: Cassie Click, MD;  Location: [facility] ENDOSCOPY;  Service: Endoscopy;  Laterality: N/A;   COLONOSCOPY WITH PROPOFOL  N/A 02/18/2020   Procedure: COLONOSCOPY WITH PROPOFOL ;  Surgeon: Marnee Sink, MD;  Location: [facility] ENDOSCOPY;  Service: Endoscopy;  Laterality: N/A;   COLONOSCOPY WITH PROPOFOL  N/A 09/10/2022   Procedure: COLONOSCOPY WITH PROPOFOL ;  Surgeon: Luke Salaam, MD;  Location: [facility] ENDOSCOPY;  Service: Gastroenterology;  Laterality: N/A;   ESOPHAGEAL BANDING  09/10/2022   Procedure: ESOPHAGEAL BANDING;  Surgeon: Luke Salaam, MD;  Location: [facility] ENDOSCOPY;  Service: Gastroenterology;;   ESOPHAGEAL BANDING  10/17/2022   Procedure: ESOPHAGEAL BANDING;  Surgeon: Marnee Sink, MD;  Location: [facility] ENDOSCOPY;  Service: Endoscopy;;   ESOPHAGEAL BANDING  12/24/2022   Procedure: ESOPHAGEAL BANDING;  Surgeon: Marnee Sink, MD;  Location: [facility] ENDOSCOPY;  Service: Endoscopy;;   ESOPHAGOGASTRODUODENOSCOPY (EGD) WITH PROPOFOL  N/A 03/11/2016   Procedure:  ESOPHAGOGASTRODUODENOSCOPY (EGD) WITH PROPOFOL ;  Surgeon: Cassie Click, MD;  Location: [facility] ENDOSCOPY;  Service: Endoscopy;  Laterality: N/A;   ESOPHAGOGASTRODUODENOSCOPY (EGD) WITH PROPOFOL  N/A 02/18/2020   Procedure: ESOPHAGOGASTRODUODENOSCOPY (EGD) WITH PROPOFOL ;  Surgeon: Marnee Sink, MD;  Location: [facility] ENDOSCOPY;  Service: Endoscopy;  Laterality: N/A;   ESOPHAGOGASTRODUODENOSCOPY (EGD) WITH PROPOFOL  N/A 04/25/2020   Procedure: ESOPHAGOGASTRODUODENOSCOPY (EGD) WITH PROPOFOL ;  Surgeon: Marnee Sink, MD;  Location: [facility] ENDOSCOPY;  Service: Endoscopy;  Laterality: N/A;   ESOPHAGOGASTRODUODENOSCOPY (EGD) WITH PROPOFOL  N/A 05/23/2020   Procedure: ESOPHAGOGASTRODUODENOSCOPY (EGD) WITH PROPOFOL ;  Surgeon: Marnee Sink, MD;  Location: [facility] ENDOSCOPY;  Service: Endoscopy;  Laterality: N/A;   ESOPHAGOGASTRODUODENOSCOPY (EGD) WITH PROPOFOL  N/A 09/10/2022   Procedure: ESOPHAGOGASTRODUODENOSCOPY (EGD) WITH PROPOFOL ;  Surgeon: Luke Salaam, MD;  Location: [facility] ENDOSCOPY;  Service: Gastroenterology;  Laterality: N/A;   ESOPHAGOGASTRODUODENOSCOPY (EGD) WITH PROPOFOL  N/A 10/17/2022   Procedure: ESOPHAGOGASTRODUODENOSCOPY (EGD) WITH PROPOFOL ;  Surgeon: Marnee Sink, MD;  Location: [facility] ENDOSCOPY;  Service: Endoscopy;  Laterality: N/A;   ESOPHAGOGASTRODUODENOSCOPY (EGD) WITH PROPOFOL  N/A 12/24/2022  Procedure: ESOPHAGOGASTRODUODENOSCOPY (EGD) WITH PROPOFOL ;  Surgeon: Marnee Sink, MD;  Location: [facility] ENDOSCOPY;  Service: Endoscopy;  Laterality: N/A;   ESOPHAGOGASTRODUODENOSCOPY (EGD) WITH PROPOFOL  N/A 01/29/2023   Procedure: ESOPHAGOGASTRODUODENOSCOPY (EGD) WITH PROPOFOL ;  Surgeon: Shane Darling, MD;  Location: [facility] ENDOSCOPY;  Service: Endoscopy;  Laterality: N/A;  Ideally we would intubate if possible given concern for varices   EUS N/A 10/04/2021   Procedure: LOWER ENDOSCOPIC ULTRASOUND (EUS);  Surgeon: Rayford Cake, MD;  Location: [facility] ENDOSCOPY;  Service: Gastroenterology;  Laterality: N/A;  LAB  CORP   EYE SURGERY     FINGER ARTHROSCOPY WITH CARPOMETACARPEL ([facility]) ARTHROPLASTY Right 09/03/2018   Procedure: CARPOMETACARPEL [facility]) ARTHROPLASTY RIGHT THUMB;  Surgeon: Molli Angelucci, MD;  Location: [facility] ORS;  Service: Orthopedics;  Laterality: Right;   FLEXIBLE SIGMOIDOSCOPY N/A 09/14/2021   Procedure: FLEXIBLE SIGMOIDOSCOPY;  Surgeon: Marnee Sink, MD;  Location: [facility] ENDOSCOPY;  Service: Endoscopy;  Laterality: N/A;  No anesthesia   GANGLION CYST EXCISION Right 09/03/2018   Procedure: REMOVAL GANGLION OF WRIST;  Surgeon: Molli Angelucci, MD;  Location: [facility] ORS;  Service: Orthopedics;  Laterality: Right;   HARDWARE REMOVAL Right 09/03/2018   Procedure: HARDWARE REMOVAL RIGHT THUMB;  Surgeon: Molli Angelucci, MD;  Location: [facility] ORS;  Service: Orthopedics;  Laterality: Right;  staple removed   HEMOSTASIS CLIP PLACEMENT  09/10/2022   Procedure: HEMOSTASIS CLIP PLACEMENT;  Surgeon: Luke Salaam, MD;  Location: [facility] ENDOSCOPY;  Service: Gastroenterology;;   HEMOSTASIS CONTROL  09/10/2022   Procedure: HEMOSTASIS CONTROL;  Surgeon: Luke Salaam, MD;  Location: [facility] ENDOSCOPY;  Service: Gastroenterology;;   HEMOSTASIS CONTROL  01/29/2023   Procedure: HEMOSTASIS CONTROL;  Surgeon: Shane Darling, MD;  Location[facility] ENDOSCOPY;  Service: Endoscopy;;   JOINT REPLACEMENT Left    TKR   left sinusplasty      MASTECTOMY, PARTIAL Left 10/17/2015   Procedure: MASTECTOMY PARTIAL REVISION;  Surgeon: Benancio Bracket, MD;  Location: [facility] ORS;  Service: General;  Laterality: Left;   PACEMAKER IMPLANT N/A 03/23/2021   Procedure: PACEMAKER IMPLANT;  Surgeon: Boyce Byes, MD;  [facility];  Service: Cardiovascular;  Laterality: N/A;   PARTIAL MASTECTOMY WITH NEEDLE LOCALIZATION Left 09/29/2015   Procedure: PARTIAL MASTECTOMY WITH NEEDLE LOCALIZATION;  Surgeon: Benancio Bracket, MD;  Location: [facility] ORS;  Service: General;  Laterality: Left;   POLYPECTOMY  09/10/2022   Procedure:  POLYPECTOMY;  Surgeon: Luke Salaam, MD;  Location: [facility] ENDOSCOPY;  Service: Gastroenterology;;   RECTAL EXAM UNDER ANESTHESIA N/A 10/05/2021   Procedure: RECTAL EXAM UNDER ANESTHESIA, ASPIRATION OF RECTAL CYST;  Surgeon: Eldred Grego, MD;  Location: [facility] ORS;  Service: General;  Laterality: N/A;   TOTAL ABDOMINAL HYSTERECTOMY W/ BILATERAL SALPINGOOPHORECTOMY      Current Medications: Current Meds  Medication Sig   Azelastine  HCl 137 MCG/SPRAY SOLN INHALE 1-2 PUFFS IN EACH NOSTRIL NASALLY ONCE A DAY 30 DAY(S)   benzonatate (TESSALON) 100 MG capsule Take 100 mg by mouth 3 (three) times daily as needed.   Cholecalciferol  (VITAMIN D ) 50 MCG (2000 UT) CAPS Take 2,000 Units by mouth daily.   clobetasol  ointment (TEMOVATE ) 0.05 % Apply 1 application topically as needed (vaginal irritation).   clotrimazole  (LOTRIMIN ) 1 % cream Apply 1 Application topically 2 (two) times daily as needed.   cyanocobalamin (,VITAMIN B-12,) 1000 MCG/ML injection 1,000 mcg every 30 (thirty) days.   dicyclomine  (BENTYL ) 10 MG capsule Take 10 mg by mouth in the morning, at noon, and at bedtime.   docusate sodium  (COLACE)  100 MG capsule Take 100 mg by mouth 3 (three) times daily as needed for moderate constipation.   esomeprazole (NEXIUM) 40 MG capsule Take 40 mg by mouth 2 (two) times daily before a meal.    estradiol  (ESTRACE ) 0.1 MG/GM vaginal cream Place 1 Applicatorful vaginally daily as needed (irritation).   fenofibrate  160 MG tablet Take 160 mg by mouth at bedtime.   gabapentin  (NEURONTIN ) 300 MG capsule Take 300 mg by mouth at bedtime.    hydrOXYzine  (ATARAX /VISTARIL ) 25 MG tablet Take 25 mg by mouth every 6 (six) hours as needed for anxiety, itching, nausea or vomiting.   levocetirizine (XYZAL) 5 MG tablet Take 5 mg by mouth every evening.   levothyroxine  (SYNTHROID , LEVOTHROID) 75 MCG tablet Take 75 mcg by mouth daily before breakfast.    magnesium  oxide (MAG-OX) 400 MG tablet Take 400 mg by mouth 2  (two) times daily.    MOUNJARO  7.5 MG/0.5ML Pen Inject 12.5 mg into the skin once a week.   nadolol  (CORGARD ) 20 MG tablet Take 1 tablet (20 mg total) by mouth daily.   NONFORMULARY OR COMPOUNDED ITEM Nifedipine 0.3% plus lidocaine  2% cream apply to rectum BID and after every BM.   NOVOLOG  FLEXPEN 100 UNIT/ML FlexPen Inject 48-68 Units into the skin 3 (three) times daily with meals. Based on sliding scale   potassium chloride  SA (K-DUR,KLOR-CON ) 20 MEQ tablet Take 20 mEq by mouth 2 (two) times daily.    saccharomyces boulardii (FLORASTOR) 250 MG capsule Take 250 mg by mouth 2 (two) times daily.    simvastatin  (ZOCOR ) 20 MG tablet Take 20 mg by mouth at bedtime.   spironolactone  (ALDACTONE ) 25 MG tablet TAKE 1/2 TABLET BY MOUTH DAILY   torsemide  (DEMADEX ) 20 MG tablet Take 20 mg by mouth daily.   TRESIBA  FLEXTOUCH 200 UNIT/ML FlexTouch Pen 194 Unit(s) SUB-Q Daily   valACYclovir  (VALTREX ) 500 MG tablet Take 500 mg by mouth 2 (two) times daily as needed.     Allergies:   Codeine, Demeclocycline, Demerol [meperidine], Hydrocodone, Other, Oxycodone , Pentazocine, Tetracyclines & related, Coal tar extract, Hydrocodone-acetaminophen , Salicylic acid, Tetracycline hcl, and Fentanyl    Social History   Socioeconomic History   Marital status: Married    Spouse name: Richardean Chancellor   Number of children: Not on file   Years of education: Not on file   Highest education level: Not on file  Occupational History   Not on file  Tobacco Use   Smoking status: Never   Smokeless tobacco: Never  Vaping Use   Vaping status: Never Used  Substance and Sexual Activity   Alcohol use: Never   Drug use: Never   Sexual activity: Not Currently    Birth control/protection: Surgical  Other Topics Concern   Not on file  Social History Narrative    Husband lives at nursing home , lives with husbands sister   Social Drivers of Health   Financial Resource Strain: Not on file  Food Insecurity: No Food Insecurity  (01/29/2023)   Hunger Vital Sign    Worried About Running Out of Food in the Last Year: Never true    Ran Out of Food in the Last Year: Never true  Transportation Needs: No Transportation Needs (01/29/2023)   PRAPARE - Administrator, Civil Service (Medical): No    Lack of Transportation (Non-Medical): No  Physical Activity: Unknown (01/30/2018)   Received [facility]   Exercise Vital Sign    Days of Exercise per  Week: 7 days    Minutes of Exercise per Session: Not on file  Stress: Not on file  Social Connections: Not on file     Family History: The patient's family history includes Bladder Cancer in her brother; Breast cancer in her paternal grandmother; Colon cancer in her father and maternal uncle; Diabetes in her brother and sister; Heart disease in her brother; Leukemia in her mother; Prostate cancer in her brother and brother. There is no history of Ovarian cancer or Kidney cancer.  ROS:   Please see the history of present illness.     All other systems reviewed and are negative.  EKGs/Labs/Other Studies Reviewed:    The following studies were reviewed today:  cMRI 11/2022 CONCLUSION: 1. No evidence of infiltrative disease. In the absence of system disease, differential includes basal septal hypertrophy of aging vs non obstructive hypertrophic cardiomyopathy with no high risk features.   2.  Severe left atrial dilation.   3.  Mild tricuspid regurgitation around device lead.   4. Mitral valve appears post inflammatory with posterior restriction. There is no localized signal void artifact. Prior, undisclosed mitral valve surgery may have been a repair.   Gloriann Larger MD    Echo 09/2022 1. No LVOT obstruction. Left ventricular ejection fraction, by  estimation, is 55 to 60%. The left ventricle has normal function. The left  ventricle has no regional wall motion abnormalities. There is severe  asymmetric left ventricular  hypertrophy of the   septal segment. Left ventricular diastolic parameters are consistent with  Grade II diastolic dysfunction (pseudonormalization).   2. Right ventricular systolic function is normal. The right ventricular  size is normal.   3. Left atrial size was severely dilated.   4. The mitral valve has been repaired/replaced. Mild mitral valve  regurgitation. The mean mitral valve gradient is 4.0 mmHg.   5. The aortic valve is tricuspid. Aortic valve regurgitation is not  visualized. Aortic valve sclerosis/calcification is present, without any  evidence of aortic stenosis.   EKG:  EKG is ordered today.  The ekg ordered today demonstrates V paced 60bpm, appears to have underlying aflutter  Recent Labs: 09/11/2022: TSH 0.466 01/28/2023: ALT 14 01/31/2023: BUN 13; Creatinine, Ser 0.65; Hemoglobin 8.3; Magnesium  2.1; Platelets 64; Potassium 3.5; Sodium 136  Recent Lipid Panel    Component Value Date/Time   CHOL 160 11/30/2020 0944   TRIG 156 (H) 11/30/2020 0944   HDL 26 (L) 11/30/2020 0944   CHOLHDL 6.2 (H) 11/30/2020 0944   LDLCALC 106 (H) 11/30/2020 0944    Physical Exam:    VS:  BP (!) 106/52 (BP Location: Left Arm, Patient Position: Sitting, Cuff Size: Normal)   Pulse 60   Ht 5\' 3"  (1.6 m)   Wt 145 lb 8 oz (66 kg)   LMP  (LMP Unknown)   SpO2 96%   BMI 25.77 kg/m     Wt Readings from Last 3 Encounters:  02/26/23 145 lb 8 oz (66 kg)  01/28/23 139 lb (63 kg)  12/24/22 143 lb (64.9 kg)     GEN:  Well nourished, well developed in no acute distress HEENT: Normal NECK: No JVD; No carotid bruits LYMPHATICS: No lymphadenopathy CARDIAC: RRR, no murmurs, rubs, gallops RESPIRATORY:  Clear to auscultation without rales, wheezing or rhonchi  ABDOMEN: Soft, non-tender, non-distended MUSCULOSKELETAL:  No edema; No deformity  SKIN: Warm and dry NEUROLOGIC:  Alert and oriented x 3 PSYCHIATRIC:  Normal affect   ASSESSMENT:    1. Paroxysmal atrial  fibrillation (HCC)   2.  Mitral valve disorder   3. Tachy-brady syndrome (HCC)   4. S/P placement of cardiac pacemaker   5. LVH (left ventricular hypertrophy)   6. History of GI bleed   7. Hepatic cirrhosis, unspecified hepatic cirrhosis type, unspecified whether ascites present (HCC)    PLAN:    In order of problems listed above:  Paroxysmal Afib/flutter EKG today shows V paced rhythm with suspected underlying atrial flutter.  Prior device checks have shown low burden of A-fib/flutter.  She is not on a blood thinner due to history of GI bleed and cirrhosis.  S/p mitral valve surgery Normally functioning valve on most recent echo  Tachy-brady syndrome s/p PPM and AV function ablation Normally functioning device.  1 A-fib episode.  She is followed by Dr. Marven Slimmer.  LVH Cardiac MRI showed no evidence of infiltrative disease.  Differential includes basal septal hypertrophy of aging versus nonobstructive hypertrophic cardiomyopathy with no high risk features.  Will discuss with Dr. Paulita Boss recommendations.  H/o GIB and cirrhosis This is a chronic issues not on a/c. Most recent Hgb 8-9  Disposition: Follow up in 6 month(s) with MD/APP    Signed, Leocadio Heal Rebekah Canada, PA-C  02/26/2023 10:42 AM    [facility]

## 2023-02-26 NOTE — Patient Instructions (Signed)
 Medication Instructions:   Please take Torsemide  40 MG (2 tablets) for two days and then go back to Torsemide  20 MG daily (1 tablet) daily.  *If you need a refill on your cardiac medications before your next appointment, please call your pharmacy*   Lab Work: None ordered If you have labs (blood work) drawn today and your tests are completely normal, you will receive your results only by: MyChart Message (if you have MyChart) OR A paper copy in the mail If you have any lab test that is abnormal or we need to change your treatment, we will call you to review the results.   Testing/Procedures: None ordered   Follow-Up: At [facility], you and your health needs are our priority.  As part of our continuing mission to provide you with exceptional heart care, we have created designated Provider Care Teams.  These Care Teams include your primary Cardiologist (physician) and Advanced Practice Providers (APPs -  Physician Assistants and Nurse Practitioners) who all work together to provide you with the care you need, when you need it.  We recommend signing up for the patient portal called "MyChart".  Sign up information is provided on this After Visit Summary.  MyChart is used to connect with patients for Virtual Visits (Telemedicine).  Patients are able to view lab/test results, encounter notes, upcoming appointments, etc.  Non-urgent messages can be sent to your provider as well.   To learn more about what you can do with MyChart, go to [email or web address].    Your next appointment:   Follow up in May.   Provider:   You may see Sammy Crisp, MD or one of the following Advanced Practice Providers on your designated Care Team:   Laneta Pintos, NP Varney Gentleman, PA-C Cadence Gennaro Khat, PA-C Ronald Cockayne, NP Morey Ar, NP    Other Instructions Please follow up with Dr. Marven Slimmer in March.

## 2023-03-02 ENCOUNTER — Inpatient Hospital Stay
Admission: EM | Admit: 2023-03-02 | Discharge: 2023-03-04 | DRG: 377 | Disposition: A | Discharge status: Home / Self Care | Payer: Medicare Other | Attending: Internal Medicine | Admitting: Internal Medicine

## 2023-03-02 ENCOUNTER — Other Ambulatory Visit: Payer: Self-pay

## 2023-03-02 ENCOUNTER — Emergency Department: Payer: Medicare PPO

## 2023-03-02 DIAGNOSIS — E785 Hyperlipidemia, unspecified: Secondary | ICD-10-CM | POA: Diagnosis present

## 2023-03-02 DIAGNOSIS — D62 Acute posthemorrhagic anemia: Secondary | ICD-10-CM | POA: Diagnosis present

## 2023-03-02 DIAGNOSIS — K625 Hemorrhage of anus and rectum: Secondary | ICD-10-CM | POA: Diagnosis present

## 2023-03-02 DIAGNOSIS — R109 Unspecified abdominal pain: Secondary | ICD-10-CM | POA: Diagnosis not present

## 2023-03-02 DIAGNOSIS — E878 Other disorders of electrolyte and fluid balance, not elsewhere classified: Secondary | ICD-10-CM | POA: Diagnosis present

## 2023-03-02 DIAGNOSIS — I110 Hypertensive heart disease with heart failure: Secondary | ICD-10-CM | POA: Diagnosis present

## 2023-03-02 DIAGNOSIS — K7460 Unspecified cirrhosis of liver: Secondary | ICD-10-CM | POA: Diagnosis present

## 2023-03-02 DIAGNOSIS — Z961 Presence of intraocular lens: Secondary | ICD-10-CM | POA: Diagnosis present

## 2023-03-02 DIAGNOSIS — I2510 Atherosclerotic heart disease of native coronary artery without angina pectoris: Secondary | ICD-10-CM | POA: Diagnosis present

## 2023-03-02 DIAGNOSIS — Z800 Family history of malignant neoplasm of digestive organs: Secondary | ICD-10-CM

## 2023-03-02 DIAGNOSIS — D61818 Other pancytopenia: Secondary | ICD-10-CM | POA: Diagnosis present

## 2023-03-02 DIAGNOSIS — E876 Hypokalemia: Secondary | ICD-10-CM | POA: Diagnosis present

## 2023-03-02 DIAGNOSIS — K3189 Other diseases of stomach and duodenum: Secondary | ICD-10-CM | POA: Diagnosis present

## 2023-03-02 DIAGNOSIS — K766 Portal hypertension: Secondary | ICD-10-CM | POA: Diagnosis present

## 2023-03-02 DIAGNOSIS — Z9842 Cataract extraction status, left eye: Secondary | ICD-10-CM

## 2023-03-02 DIAGNOSIS — E039 Hypothyroidism, unspecified: Secondary | ICD-10-CM | POA: Diagnosis present

## 2023-03-02 DIAGNOSIS — Z833 Family history of diabetes mellitus: Secondary | ICD-10-CM

## 2023-03-02 DIAGNOSIS — K529 Noninfective gastroenteritis and colitis, unspecified: Secondary | ICD-10-CM | POA: Diagnosis present

## 2023-03-02 DIAGNOSIS — Z8042 Family history of malignant neoplasm of prostate: Secondary | ICD-10-CM

## 2023-03-02 DIAGNOSIS — Z7985 Long-term (current) use of injectable non-insulin antidiabetic drugs: Secondary | ICD-10-CM

## 2023-03-02 DIAGNOSIS — I851 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 bleeding: Secondary | ICD-10-CM | POA: Diagnosis present

## 2023-03-02 DIAGNOSIS — Z8052 Family history of malignant neoplasm of bladder: Secondary | ICD-10-CM

## 2023-03-02 DIAGNOSIS — Z794 Long term (current) use of insulin: Secondary | ICD-10-CM

## 2023-03-02 DIAGNOSIS — I495 Sick sinus syndrome: Secondary | ICD-10-CM | POA: Diagnosis present

## 2023-03-02 DIAGNOSIS — G4733 Obstructive sleep apnea (adult) (pediatric): Secondary | ICD-10-CM | POA: Diagnosis present

## 2023-03-02 DIAGNOSIS — E119 Type 2 diabetes mellitus without complications: Secondary | ICD-10-CM | POA: Diagnosis present

## 2023-03-02 DIAGNOSIS — K760 Fatty (change of) liver, not elsewhere classified: Secondary | ICD-10-CM | POA: Diagnosis present

## 2023-03-02 DIAGNOSIS — Z8719 Personal history of other diseases of the digestive system: Secondary | ICD-10-CM | POA: Diagnosis not present

## 2023-03-02 DIAGNOSIS — Z885 Allergy status to narcotic agent status: Secondary | ICD-10-CM

## 2023-03-02 DIAGNOSIS — R188 Other ascites: Secondary | ICD-10-CM | POA: Diagnosis present

## 2023-03-02 DIAGNOSIS — Z86008 Personal history of in-situ neoplasm of other site: Secondary | ICD-10-CM

## 2023-03-02 DIAGNOSIS — Z8249 Family history of ischemic heart disease and other diseases of the circulatory system: Secondary | ICD-10-CM

## 2023-03-02 DIAGNOSIS — D649 Anemia, unspecified: Secondary | ICD-10-CM | POA: Diagnosis not present

## 2023-03-02 DIAGNOSIS — Z853 Personal history of malignant neoplasm of breast: Secondary | ICD-10-CM

## 2023-03-02 DIAGNOSIS — J189 Pneumonia, unspecified organism: Secondary | ICD-10-CM | POA: Diagnosis present

## 2023-03-02 DIAGNOSIS — I480 Paroxysmal atrial fibrillation: Secondary | ICD-10-CM | POA: Diagnosis present

## 2023-03-02 DIAGNOSIS — Z9012 Acquired absence of left breast and nipple: Secondary | ICD-10-CM

## 2023-03-02 DIAGNOSIS — I5032 Chronic diastolic (congestive) heart failure: Secondary | ICD-10-CM | POA: Diagnosis present

## 2023-03-02 DIAGNOSIS — Z803 Family history of malignant neoplasm of breast: Secondary | ICD-10-CM

## 2023-03-02 DIAGNOSIS — Z950 Presence of cardiac pacemaker: Secondary | ICD-10-CM | POA: Diagnosis present

## 2023-03-02 DIAGNOSIS — Z9841 Cataract extraction status, right eye: Secondary | ICD-10-CM

## 2023-03-02 DIAGNOSIS — K922 Gastrointestinal hemorrhage, unspecified: Secondary | ICD-10-CM | POA: Diagnosis not present

## 2023-03-02 DIAGNOSIS — I864 Gastric varices: Secondary | ICD-10-CM | POA: Diagnosis present

## 2023-03-02 DIAGNOSIS — Z923 Personal history of irradiation: Secondary | ICD-10-CM

## 2023-03-02 DIAGNOSIS — R161 Splenomegaly, not elsewhere classified: Secondary | ICD-10-CM | POA: Diagnosis present

## 2023-03-02 DIAGNOSIS — Z806 Family history of leukemia: Secondary | ICD-10-CM

## 2023-03-02 DIAGNOSIS — Z9049 Acquired absence of other specified parts of digestive tract: Secondary | ICD-10-CM

## 2023-03-02 DIAGNOSIS — Z79899 Other long term (current) drug therapy: Secondary | ICD-10-CM

## 2023-03-02 DIAGNOSIS — Z79890 Hormone replacement therapy (postmenopausal): Secondary | ICD-10-CM

## 2023-03-02 DIAGNOSIS — Z9079 Acquired absence of other genital organ(s): Secondary | ICD-10-CM

## 2023-03-02 DIAGNOSIS — Z90710 Acquired absence of both cervix and uterus: Secondary | ICD-10-CM

## 2023-03-02 DIAGNOSIS — I10 Essential (primary) hypertension: Secondary | ICD-10-CM | POA: Diagnosis present

## 2023-03-02 DIAGNOSIS — K7581 Nonalcoholic steatohepatitis (NASH): Secondary | ICD-10-CM | POA: Diagnosis present

## 2023-03-02 DIAGNOSIS — Z90722 Acquired absence of ovaries, bilateral: Secondary | ICD-10-CM

## 2023-03-02 LAB — CBC WITH DIFFERENTIAL/PLATELET
Abs Immature Granulocytes: 0 10*3/uL (ref 0.00–0.07)
Abs Immature Granulocytes: 0 10*3/uL (ref 0.00–0.07)
Basophils Absolute: 0 10*3/uL (ref 0.0–0.1)
Basophils Absolute: 0 10*3/uL (ref 0.0–0.1)
Basophils Relative: 1 %
Basophils Relative: 1 %
Eosinophils Absolute: 0.1 10*3/uL (ref 0.0–0.5)
Eosinophils Absolute: 0.1 10*3/uL (ref 0.0–0.5)
Eosinophils Relative: 5 %
Eosinophils Relative: 6 %
HCT: 29 % — ABNORMAL LOW (ref 36.0–46.0)
HCT: 29.6 % — ABNORMAL LOW (ref 36.0–46.0)
Hemoglobin: 9.6 g/dL — ABNORMAL LOW (ref 12.0–15.0)
Hemoglobin: 9.8 g/dL — ABNORMAL LOW (ref 12.0–15.0)
Immature Granulocytes: 0 %
Immature Granulocytes: 0 %
Lymphocytes Relative: 33 %
Lymphocytes Relative: 37 %
Lymphs Abs: 0.5 10*3/uL — ABNORMAL LOW (ref 0.7–4.0)
Lymphs Abs: 0.6 10*3/uL — ABNORMAL LOW (ref 0.7–4.0)
MCH: 31.9 pg (ref 26.0–34.0)
MCH: 31.9 pg (ref 26.0–34.0)
MCHC: 33.1 g/dL (ref 30.0–36.0)
MCHC: 33.1 g/dL (ref 30.0–36.0)
MCV: 96.3 fL (ref 80.0–100.0)
MCV: 96.4 fL (ref 80.0–100.0)
Monocytes Absolute: 0.2 10*3/uL (ref 0.1–1.0)
Monocytes Absolute: 0.2 10*3/uL (ref 0.1–1.0)
Monocytes Relative: 10 %
Monocytes Relative: 11 %
Neutro Abs: 0.8 10*3/uL — ABNORMAL LOW (ref 1.7–7.7)
Neutro Abs: 0.7 10*3/uL — ABNORMAL LOW (ref 1.7–7.7)
Neutrophils Relative %: 51 %
Neutrophils Relative %: 45 %
Platelets: 54 10*3/uL — ABNORMAL LOW (ref 150–400)
Platelets: 55 10*3/uL — ABNORMAL LOW (ref 150–400)
RBC: 3.01 MIL/uL — ABNORMAL LOW (ref 3.87–5.11)
RBC: 3.07 MIL/uL — ABNORMAL LOW (ref 3.87–5.11)
RDW: 15.9 % — ABNORMAL HIGH (ref 11.5–15.5)
RDW: 15.9 % — ABNORMAL HIGH (ref 11.5–15.5)
Smear Review: DECREASED
Smear Review: DECREASED
WBC: 1.6 10*3/uL — ABNORMAL LOW (ref 4.0–10.5)
WBC: 1.6 10*3/uL — ABNORMAL LOW (ref 4.0–10.5)
nRBC: 0 % (ref 0.0–0.2)
nRBC: 0 % (ref 0.0–0.2)

## 2023-03-02 LAB — COMPREHENSIVE METABOLIC PANEL
ALT: 20 U/L (ref 0–44)
AST: 35 U/L (ref 15–41)
Albumin: 3.3 g/dL — ABNORMAL LOW (ref 3.5–5.0)
Alkaline Phosphatase: 42 U/L (ref 38–126)
Anion gap: 6 (ref 5–15)
BUN: 13 mg/dL (ref 8–23)
CO2: 25 mmol/L (ref 22–32)
Calcium: 8.6 mg/dL — ABNORMAL LOW (ref 8.9–10.3)
Chloride: 106 mmol/L (ref 98–111)
Creatinine, Ser: 0.73 mg/dL (ref 0.44–1.00)
GFR, Estimated: >60 mL/min (ref >60)
Glucose, Bld: 136 mg/dL — ABNORMAL HIGH (ref 70–99)
Potassium: 3.4 mmol/L — ABNORMAL LOW (ref 3.5–5.1)
Sodium: 137 mmol/L (ref 135–145)
Total Bilirubin: 1 mg/dL (ref 0.0–1.2)
Total Protein: 7.5 g/dL (ref 6.5–8.1)

## 2023-03-02 LAB — TYPE AND SCREEN
ABO/RH(D): A POS
Antibody Screen: NEGATIVE

## 2023-03-02 MED ORDER — DEXTROSE 50 % IV SOLN: 12.5000 g | INTRAVENOUS | Status: DC

## 2023-03-02 MED ORDER — INSULIN ASPART 100 UNIT/ML IJ SOLN: 0.0000 [IU] | INTRAMUSCULAR | Status: DC | PRN

## 2023-03-02 MED ORDER — SODIUM CHLORIDE 0.9% FLUSH
3.0000 mL | INTRAVENOUS | Status: DC | PRN
Start: 2023-03-02 — End: 2023-03-04

## 2023-03-02 MED ORDER — PANTOPRAZOLE SODIUM 40 MG IV SOLR
40.0000 mg | Freq: Two times a day (BID) | INTRAVENOUS | Status: DC
  Administered 2023-03-03 – 2023-03-04 (×3): 40 mg via INTRAVENOUS
  Filled 2023-03-02 (×3): qty 10

## 2023-03-02 MED ORDER — SODIUM CHLORIDE 0.9% FLUSH
3.0000 mL | Freq: Two times a day (BID) | INTRAVENOUS | Status: DC
  Administered 2023-03-03 (×2): 3 mL via INTRAVENOUS
  Administered 2023-03-04: 10 mL via INTRAVENOUS

## 2023-03-02 MED ORDER — ONDANSETRON HCL 4 MG/2ML IJ SOLN
4.0000 mg | Freq: Four times a day (QID) | INTRAMUSCULAR | Status: DC | PRN
  Administered 2023-03-03: 4 mg via INTRAVENOUS
  Filled 2023-03-02: qty 2

## 2023-03-02 MED ORDER — SODIUM CHLORIDE 0.9 % IV SOLN
50.0000 ug/h | INTRAVENOUS | Status: DC
  Administered 2023-03-02 – 2023-03-04 (×3): 50 ug/h via INTRAVENOUS
  Filled 2023-03-02 (×5): qty 1

## 2023-03-02 MED ORDER — SODIUM CHLORIDE 0.9 % IV SOLN
1.0000 g | Freq: Once | INTRAVENOUS | Status: AC
  Administered 2023-03-02: 1 g via INTRAVENOUS
  Filled 2023-03-02: qty 10

## 2023-03-02 MED ORDER — SODIUM CHLORIDE 0.9 % IV SOLN
1.0000 g | INTRAVENOUS | Status: DC
Start: 2023-03-03 — End: 2023-03-04
  Administered 2023-03-03: 1 g via INTRAVENOUS
  Filled 2023-03-02: qty 10

## 2023-03-02 MED ORDER — PANTOPRAZOLE SODIUM 40 MG IV SOLR
40.0000 mg | INTRAVENOUS | Status: AC
Start: 2023-03-02 — End: 2023-03-02
  Administered 2023-03-02 (×2): 40 mg via INTRAVENOUS
  Filled 2023-03-02: qty 10

## 2023-03-02 MED ORDER — ONDANSETRON HCL 4 MG PO TABS: 4.0000 mg | ORAL_TABLET | Freq: Four times a day (QID) | ORAL | Status: DC | PRN

## 2023-03-02 NOTE — Assessment & Plan Note (Signed)
Patient has Annette Foster and cirrhosis, with esophageal varices. Monitor closely patient to follow-up with GI outpatient closely.

## 2023-03-02 NOTE — Assessment & Plan Note (Signed)
Home regimen consist levothyroxine at 75 we will keep patient n.p.o. for now we will resume her levothyroxine postprocedure .

## 2023-03-02 NOTE — Assessment & Plan Note (Signed)
Last echo in 09/2022: 1. No LVOT obstruction. Left ventricular ejection fraction, by estimation, is 55 to 60% . The left ventricle has normal function. The left ventricle has no regional wall motion abnormalities. There is severe asymmetric left ventricular hypertrophy of the septal segment. Left ventricular diastolic parameters are consistent with Grade II diastolic dysfunction ( pseudonormalization) .  2. Right ventricular systolic function is normal. The right ventricular size is normal.  3. Left atrial size was severely dilated.  4. The mitral valve has been repaired/ replaced. Mild mitral valve regurgitation. The mean mitral valve gradient is 4. 0 mmHg.  5. The aortic valve is tricuspid. Aortic valve regurgitation is not visualized. Aortic valve sclerosis/ calcification is present, without any evidence of aortic stenosis.  Clinically euvolemic.  CHF stable.

## 2023-03-02 NOTE — Assessment & Plan Note (Signed)
Vitals:   03/02/23 1450 03/02/23 1912  BP: (!) 139/59 (!) 116/56  In light of her soft blood pressures and risk of hypotension overnight we will hold patient's Demadex Aldactone and nadolol.

## 2023-03-02 NOTE — Assessment & Plan Note (Signed)
Patient has a history of PAF, A-fib RVR, tachybradycardia syndrome and has a pacemaker.  Patient's last device check was in November 2024 by Dr. Steffanie Dunn cardiology.

## 2023-03-02 NOTE — Assessment & Plan Note (Signed)
Will admit patient to PCU. H&H every 4. Type/screen/cross as deemed appropriate and transfuse as needed.. Patient continued on octreotide and Protonix and GI consult message sent.

## 2023-03-02 NOTE — Assessment & Plan Note (Signed)
    Latest Ref Rng & Units 03/02/2023    8:42 PM 03/02/2023    2:55 PM 01/31/2023    5:55 AM  CBC  WBC 4.0 - 10.5 K/uL 1.6  1.6  1.5   Hemoglobin 12.0 - 15.0 g/dL 9.8  9.6  8.3   Hematocrit 36.0 - 46.0 % 29.6  29.0  24.8   Platelets 150 - 400 K/uL 55  54  64   Being followed by hematology oncology outpatient Dr. Donneta Romberg per previous note.

## 2023-03-02 NOTE — Assessment & Plan Note (Signed)
N.p.o., glycemic protocol with Accu-Cheks every 4 hourly.

## 2023-03-02 NOTE — ED Provider Notes (Signed)
[facility] Provider Note    Event Date/Time   First MD Initiated Contact with Patient 03/02/23 1953     (approximate)   History   Rectal Bleeding   HPI  Annette Foster is a 83 y.o. 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  She describes bright red blood.  She reports generalized abdominal discomfort but no acute pain.  She has no nausea or vomiting.  I reviewed the past medical records.  The patient was admitted to the hospitalist service in December after presenting with rectal bleeding.  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   Physical Exam   Triage Vital Signs: ED Triage Vitals  Encounter Vitals Group     BP 03/02/23 1450 (!) 139/59     Systolic BP Percentile --      Diastolic BP Percentile --      Pulse Rate 03/02/23 1450 78     Resp 03/02/23 1450 17     Temp 03/02/23 1450 97.9 F (36.6 C)     Temp Source 03/02/23 1450 Oral     SpO2 03/02/23 1450 98 %     Weight 03/02/23 1453 133 lb (60.3 kg)     Height 03/02/23 1453 5\' 2"  (1.575 m)     Head Circumference --      Peak Flow --      Pain Score 03/02/23 1453 8     Pain Loc --      Pain Education --      Exclude from Growth Chart --     Most recent vital signs: Vitals:   03/02/23 1450 03/02/23 1912  BP: (!) 139/59 (!) 116/56  Pulse: 78 65  Resp: 17 16  Temp: 97.9 F (36.6 C) 98.2 F (36.8 C)  SpO2: 98% 100%     General: Awake, no distress.  CV:  Good peripheral perfusion.  Resp:  Normal effort.  Abd:  No distention.  Soft and nontender. Other:  No conjunctival pallor.   ED Results / Procedures / Treatments    Labs (all labs ordered are listed, but only abnormal results are displayed) Labs Reviewed  CBC WITH DIFFERENTIAL/PLATELET - Abnormal; Notable for the following components:      Result Value   WBC 1.6 (*)    RBC 3.01 (*)    Hemoglobin 9.6 (*)    HCT 29.0 (*)    RDW 15.9 (*)    Platelets 54 (*)    Neutro Abs 0.8 (*)    Lymphs Abs 0.5 (*)    All other components within normal limits  COMPREHENSIVE METABOLIC PANEL - Abnormal; Notable for the following components:   Potassium 3.4 (*)    Glucose, Bld 136 (*)    Calcium 8.6 (*)    Albumin 3.3 (*)    All other components within normal limits  CBC WITH DIFFERENTIAL/PLATELET - Abnormal; Notable for the following components:   WBC 1.6 (*)    RBC 3.07 (*)    Hemoglobin 9.8 (*)    HCT 29.6 (*)    RDW 15.9 (*)    Platelets 55 (*)    Neutro Abs 0.7 (*)  Lymphs Abs 0.6 (*)    All other components within normal limits  PATHOLOGIST SMEAR REVIEW  TYPE AND SCREEN     EKG     RADIOLOGY    PROCEDURES:  Critical Care performed: No  Procedures   MEDICATIONS ORDERED IN ED: Medications  octreotide (SANDOSTATIN) 500 mcg in sodium chloride 0.9 % 250 mL (2 mcg/mL) infusion (50 mcg/hr Intravenous New Bag/Given 03/02/23 2136)  pantoprazole (PROTONIX) injection 40 mg (40 mg Intravenous Given 03/02/23 2100)    Followed by  pantoprazole (PROTONIX) injection 40 mg (has no administration in time range)  ondansetron (ZOFRAN) tablet 4 mg (has no administration in time range)    Or  ondansetron (ZOFRAN) injection 4 mg (has no administration in time range)  sodium chloride flush (NS) 0.9 % injection 3-10 mL (has no administration in time range)  sodium chloride flush (NS) 0.9 % injection 3-10 mL (has no administration in time range)  insulin aspart (novoLOG) injection 0-6 Units (has no administration in time range)  cefTRIAXone (ROCEPHIN) 1 g in sodium chloride 0.9 % 100 mL IVPB (has no administration in time range)  dextrose 50 %  solution 12.5 g (has no administration in time range)  cefTRIAXone (ROCEPHIN) 1 g in sodium chloride 0.9 % 100 mL IVPB (1 g Intravenous New Bag/Given 03/02/23 2200)     IMPRESSION / MDM / ASSESSMENT AND PLAN / ED COURSE  I reviewed the triage vital signs and the nursing notes.  83 year old female with PMH as noted above including recent admission for GI from an angiodysplastic duodenal lesion presents with recurrent rectal bleeding similar to prior.  On exam the patient is overall well-appearing.  Her vital signs are normal.  Initial labs are reassuring.  Hemoglobin is 9.6 which is improved from her recent admission.  CMP shows no acute findings.  Differential diagnosis includes, but is not limited to, recurrent duodenal bleeding, bleeding from esophageal varices, less likely lower GI etiology.  I have ordered IV Protonix and octreotide.  I consulted Dr. Timothy Lasso from GI who recommends also adding on ceftriaxone and keeping the patient n.p.o. after midnight for possible endoscopy tomorrow.  Patient's presentation is most consistent with acute presentation with potential threat to life or bodily function.  ----------------------------------------- 11:13 PM on 03/02/2023 -----------------------------------------  Repeat CBC shows stable hemoglobin.  I consulted Dr. Allena Katz from the hospitalist service; based on our discussion she agrees to evaluate the patient for admission.     FINAL CLINICAL IMPRESSION(S) / ED DIAGNOSES   Final diagnoses:  Acute GI bleeding     Rx / DC Orders   ED Discharge Orders     None        Note:  This document was prepared using Dragon voice recognition software and may include unintentional dictation errors.    Dionne Bucy, MD 03/02/23 2316

## 2023-03-02 NOTE — H&P (Incomplete)
History and Physical    Patient: Annette Foster NGE:[id number] DOB: 07-06-1940 DOA: 03/02/2023 DOS: the patient was seen and examined on 03/03/2023 PCP: Jaclyn Shaggy, MD  Patient coming from: Home Chief complaint: Chief Complaint  Patient presents with   Rectal Bleeding   HPI:  Annette Foster is a 83 y.o. female with past medical history  of  type 2 diabetes mellitus, CHF, GERD, GI bleed, allergies to multiple medications 13 listed please review the list, hypertension, dyslipidemia, hypothyroidism, IBS and OSA on CPAP, who presented to the emergency room for rectal bleeding.  Patient today also reported abdominal pain and perirectal pain.  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  >>ED Course: In emergency room patient is alert awake oriented afebrile and stable on presentation Vitals:   03/02/23 2300 03/02/23 2330 03/03/23 0000 03/03/23 0207  BP: 123/60 130/60 (!) 110/48   Pulse: 63 64 63   Temp:    98.1 F (36.7 C)  Resp:      Height:      Weight:      SpO2: 97% 96% 98%   TempSrc:    Oral  BMI (Calculated):      ED evaluation  so far shows: Metabolic panel showing potassium of 3.4 glucose 136 albumin 3.3 normal LFTs electrolytes and kidney functions. CBC shows pancytopenia with a hemoglobin of 9.8 WBC of 1.6 hematocrit of 29.6 MCV 96.4 RDW of 15.9 platelet count of 55. In the emergency room  pt has received the following treatment thus far: Medications  octreotide (SANDOSTATIN) 500 mcg in sodium chloride 0.9 % 250 mL (2 mcg/mL) infusion (50 mcg/hr Intravenous New Bag/Given 03/02/23 2136)  pantoprazole (PROTONIX) injection 40 mg (40 mg Intravenous Given 03/02/23 2100)  pantoprazole (PROTONIX) injection 40 mg (has no  administration in time range)  cefTRIAXone (ROCEPHIN) 1 g in sodium chloride 0.9 % 100 mL IVPB (1 g Intravenous New Bag/Given 03/02/23 2200)  EDMD has reached out to GI on-call Dr. Timothy Lasso who recommended octreotide drip Rocephin Protonix admission to medicine team and n.p.o. after midnight and consult. Review of Systems  Gastrointestinal:  Positive for abdominal pain and melena.   Past Medical History:  Diagnosis Date   Abdominal pain    Allergy    Cancer (HCC) 2017   breast cancer- Left   Cataract    CHF (congestive heart failure) (HCC)    Collagenous colitis    Coronary artery disease    Diabetes mellitus without complication (HCC)    Diarrhea    Diverticulosis    Dysrhythmia    Fatty liver disease, nonalcoholic    Fibrocystic breast    GERD (gastroesophageal reflux disease)    Heart murmur    Hyperlipidemia    Hypertension    Hypothyroidism    IBS (irritable bowel syndrome)    IDA (iron deficiency anemia)    Liver cirrhosis (HCC)    Personal history of radiation therapy 2017   LEFT BREAST CA   PONV (postoperative nausea and vomiting)    Presence of permanent cardiac pacemaker    Sleep apnea    C-Pap   Past Surgical History:  Procedure Laterality Date   ABDOMINAL HYSTERECTOMY     [place] b[place]   ABDOMINAL SURGERY     APPENDECTOMY     AV  NODE ABLATION N/A 05/17/2021   Procedure: AV NODE ABLATION;  Surgeon: Lanier Prude, MD;  Location: [facility] INVASIVE CV LAB;  Service: Cardiovascular;  Laterality: N/A;   BREAST BIOPSY Bilateral 2016   negative   BREAST 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/2015   DCIS, papillary carcinoma in situ   BREAST BIOPSY Left 05/27/2016   BENIGN MAMMARY EPITHELIUM   BREAST BIOPSY Left 11/20/2017   affirm bx x clip BENIGN MAMMARY EPITHELIUM CONSISTENT WITH RAD THERAPY   BREAST EXCISIONAL BIOPSY Right    NEG 1980's   BREAST LUMPECTOMY Left 10/17/2015   DCIS and papillary carcinoma insitu, clear margins   CARDIAC SURGERY     has replacement valve   CATARACT  EXTRACTION Right    CATARACT EXTRACTION W/PHACO Left 01/16/2021   Procedure: CATARACT EXTRACTION PHACO AND INTRAOCULAR LENS PLACEMENT (IOC) LEFT DIABETIC 8.46 00:59.8;  Surgeon: Galen Manila, MD;  Location: [facility];  Service: Ophthalmology;  Laterality: Left;   CHOLECYSTECTOMY     COLONOSCOPY WITH PROPOFOL N/A 03/11/2016   Procedure: COLONOSCOPY WITH PROPOFOL;  Surgeon: Scot Jun, MD;  Location: [facility] ENDOSCOPY;  Service: Endoscopy;  Laterality: N/A;   COLONOSCOPY WITH PROPOFOL N/A 02/18/2020   Procedure: COLONOSCOPY WITH PROPOFOL;  Surgeon: Midge Minium, MD;  Location: [facility] ENDOSCOPY;  Service: Endoscopy;  Laterality: N/A;   COLONOSCOPY WITH PROPOFOL N/A 09/10/2022   Procedure: COLONOSCOPY WITH PROPOFOL;  Surgeon: Wyline Mood, MD;  Location: [facility] ENDOSCOPY;  Service: Gastroenterology;  Laterality: N/A;   ESOPHAGEAL BANDING  09/10/2022   Procedure: ESOPHAGEAL BANDING;  Surgeon: Wyline Mood, MD;  Location: [facility] ENDOSCOPY;  Service: Gastroenterology;;   ESOPHAGEAL BANDING  10/17/2022   Procedure: ESOPHAGEAL BANDING;  Surgeon: Midge Minium, MD;  Location: [facility] ENDOSCOPY;  Service: Endoscopy;;   ESOPHAGEAL BANDING  12/24/2022   Procedure: ESOPHAGEAL BANDING;  Surgeon: Midge Minium, MD;  Location: [facility] ENDOSCOPY;  Service: Endoscopy;;   ESOPHAGOGASTRODUODENOSCOPY (EGD) WITH PROPOFOL N/A 03/11/2016   Procedure: ESOPHAGOGASTRODUODENOSCOPY (EGD) WITH PROPOFOL;  Surgeon: Scot Jun, MD;  Location: [facility] ENDOSCOPY;  Service: Endoscopy;  Laterality: N/A;   ESOPHAGOGASTRODUODENOSCOPY (EGD) WITH PROPOFOL N/A 02/18/2020   Procedure: ESOPHAGOGASTRODUODENOSCOPY (EGD) WITH PROPOFOL;  Surgeon: Midge Minium, MD;  Location: [facility] ENDOSCOPY;  Service: Endoscopy;  Laterality: N/A;   ESOPHAGOGASTRODUODENOSCOPY (EGD) WITH PROPOFOL N/A 04/25/2020   Procedure: ESOPHAGOGASTRODUODENOSCOPY (EGD) WITH PROPOFOL;  Surgeon: Midge Minium, MD;  Location: [facility] ENDOSCOPY;  Service: Endoscopy;  Laterality:  N/A;   ESOPHAGOGASTRODUODENOSCOPY (EGD) WITH PROPOFOL N/A 05/23/2020   Procedure: ESOPHAGOGASTRODUODENOSCOPY (EGD) WITH PROPOFOL;  Surgeon: Midge Minium, MD;  Location: [facility] ENDOSCOPY;  Service: Endoscopy;  Laterality: N/A;   ESOPHAGOGASTRODUODENOSCOPY (EGD) WITH PROPOFOL N/A 09/10/2022   Procedure: ESOPHAGOGASTRODUODENOSCOPY (EGD) WITH PROPOFOL;  Surgeon: Wyline Mood, MD;  Location: [facility] ENDOSCOPY;  Service: Gastroenterology;  Laterality: N/A;   ESOPHAGOGASTRODUODENOSCOPY (EGD) WITH PROPOFOL N/A 10/17/2022   Procedure: ESOPHAGOGASTRODUODENOSCOPY (EGD) WITH PROPOFOL;  Surgeon: Midge Minium, MD;  Location: [facility] ENDOSCOPY;  Service: Endoscopy;  Laterality: N/A;   ESOPHAGOGASTRODUODENOSCOPY (EGD) WITH PROPOFOL N/A 12/24/2022   Procedure: ESOPHAGOGASTRODUODENOSCOPY (EGD) WITH PROPOFOL;  Surgeon: Midge Minium, MD;  Location: [facility] ENDOSCOPY;  Service: Endoscopy;  Laterality: N/A;   ESOPHAGOGASTRODUODENOSCOPY (EGD) WITH PROPOFOL N/A 01/29/2023   Procedure: ESOPHAGOGASTRODUODENOSCOPY (EGD) WITH PROPOFOL;  Surgeon: Regis Bill, MD;  Location: [facility] ENDOSCOPY;  Service: Endoscopy;  Laterality: N/A;  Ideally we would intubate if possible given concern for varices   EUS N/A 10/04/2021   Procedure: LOWER ENDOSCOPIC ULTRASOUND (EUS);  Surgeon: Doren Custard, MD;  Location: [facility] ENDOSCOPY;  Service: Gastroenterology;  Laterality: N/A;  [facility]   EYE SURGERY     FINGER ARTHROSCOPY WITH CARPOMETACARPEL (CMC) ARTHROPLASTY Right 09/03/2018   Procedure: CARPOMETACARPEL [facility]) ARTHROPLASTY RIGHT THUMB;  Surgeon: Kennedy Bucker, MD;  Location: [facility] ORS;  Service: Orthopedics;  Laterality: Right;   FLEXIBLE SIGMOIDOSCOPY N/A 09/14/2021   Procedure: FLEXIBLE SIGMOIDOSCOPY;  Surgeon: Midge Minium, MD;  Location: [facility] ENDOSCOPY;  Service: Endoscopy;  Laterality: N/A;  No anesthesia   GANGLION CYST EXCISION Right 09/03/2018   Procedure: REMOVAL GANGLION OF WRIST;  Surgeon: Kennedy Bucker, MD;  Location: [facility] ORS;   Service: Orthopedics;  Laterality: Right;   HARDWARE REMOVAL Right 09/03/2018   Procedure: HARDWARE REMOVAL RIGHT THUMB;  Surgeon: Kennedy Bucker, MD;  Location: [facility] ORS;  Service: Orthopedics;  Laterality: Right;  staple removed   HEMOSTASIS CLIP PLACEMENT  09/10/2022   Procedure: HEMOSTASIS CLIP PLACEMENT;  Surgeon: Wyline Mood, MD;  Location: [facility] ENDOSCOPY;  Service: Gastroenterology;;   HEMOSTASIS CONTROL  09/10/2022   Procedure: HEMOSTASIS CONTROL;  Surgeon: Wyline Mood, MD;  Location: [facility] ENDOSCOPY;  Service: Gastroenterology;;   HEMOSTASIS CONTROL  01/29/2023   Procedure: HEMOSTASIS CONTROL;  Surgeon: Regis Bill, MD;  Location: [facility] ENDOSCOPY;  Service: Endoscopy;;   JOINT REPLACEMENT Left    TKR   left sinusplasty      MASTECTOMY, PARTIAL Left 10/17/2015   Procedure: MASTECTOMY PARTIAL REVISION;  Surgeon: Nadeen Landau, MD;  Location: [facility] ORS;  Service: General;  Laterality: Left;   PACEMAKER IMPLANT N/A 03/23/2021   Procedure: PACEMAKER IMPLANT;  Surgeon: Lanier Prude, MD;  Location: [facility] INVASIVE CV LAB;  Service: Cardiovascular;  Laterality: N/A;   PARTIAL MASTECTOMY WITH NEEDLE LOCALIZATION Left 09/29/2015   Procedure: PARTIAL MASTECTOMY WITH NEEDLE LOCALIZATION;  Surgeon: Nadeen Landau, MD;  Location: [facility] ORS;  Service: General;  Laterality: Left;   POLYPECTOMY  09/10/2022   Procedure: POLYPECTOMY;  Surgeon: Wyline Mood, MD;  Location: [facility] ENDOSCOPY;  Service: Gastroenterology;;   RECTAL EXAM UNDER ANESTHESIA N/A 10/05/2021   Procedure: RECTAL EXAM UNDER ANESTHESIA, ASPIRATION OF RECTAL CYST;  Surgeon: Carolan Shiver, MD;  Location: [facility] ORS;  Service: General;  Laterality: N/A;   TOTAL ABDOMINAL HYSTERECTOMY W/ BILATERAL SALPINGOOPHORECTOMY      reports that she has never smoked. She has never used smokeless tobacco. She reports that she does not drink alcohol and does not use drugs.  Allergies  Allergen Reactions   Codeine Itching, Nausea  And Vomiting and Other (See Comments)   Demeclocycline Rash   Demerol [Meperidine] Itching and Nausea And Vomiting   Hydrocodone Itching and Nausea And Vomiting   Morphine Nausea Only   Other Other (See Comments)   Oxycodone Itching and Nausea And Vomiting   Pentazocine Itching, Nausea And Vomiting and Other (See Comments)   Tetracyclines & Related Rash   Coal Tar Extract Other (See Comments)   Hydrocodone-Acetaminophen Other (See Comments)   Salicylic Acid Other (See Comments)   Tetracycline Hcl Other (See Comments)    Family History  Problem Relation Age of Onset   Breast cancer Paternal Grandmother    Colon cancer Father    Diabetes Sister    Diabetes Brother    Heart disease Brother    Prostate cancer Brother    Colon cancer Maternal Uncle    Prostate cancer Brother    Bladder Cancer Brother    Leukemia Mother        all   Ovarian cancer Neg Hx    Kidney cancer Neg Hx  Prior to Admission medications   Medication Sig Start Date End Date Taking? Authorizing Provider  Azelastine HCl 137 MCG/SPRAY SOLN INHALE 1-2 PUFFS IN EACH NOSTRIL NASALLY ONCE A DAY 30 DAY(S)    [provider]  benzonatate (TESSALON) 100 MG capsule Take 100 mg by mouth 3 (three) times daily as needed. 10/28/22   [provider]  Cholecalciferol (VITAMIN D) 50 MCG (2000 UT) CAPS Take 2,000 Units by mouth daily.    [provider]  clobetasol ointment (TEMOVATE) 0.05 % Apply 1 application topically as needed (vaginal irritation).    [provider]  clotrimazole (LOTRIMIN) 1 % cream Apply 1 Application topically 2 (two) times daily as needed.    [provider]  cyanocobalamin (,VITAMIN B-12,) 1000 MCG/ML injection 1,000 mcg every 30 (thirty) days. 04/23/19   [provider]  cyclobenzaprine (FLEXERIL) 5 MG tablet Take 1 tablet by mouth 2 (two) times daily. Patient not taking: Reported on 10/17/2022    [provider]  dicyclomine (BENTYL) 10  MG capsule Take 10 mg by mouth in the morning, at noon, and at bedtime. 05/16/19   [provider]  docusate sodium (COLACE) 100 MG capsule Take 100 mg by mouth 3 (three) times daily as needed for moderate constipation. 01/10/20   [provider]  esomeprazole (NEXIUM) 40 MG capsule Take 40 mg by mouth 2 (two) times daily before a meal.  02/08/16   [provider]  estradiol (ESTRACE) 0.1 MG/GM vaginal cream Place 1 Applicatorful vaginally daily as needed (irritation).    [provider]  fenofibrate 160 MG tablet Take 160 mg by mouth at bedtime.    [provider]  gabapentin (NEURONTIN) 300 MG capsule Take 300 mg by mouth at bedtime.  09/07/16 10/25/36  [provider]  hydrOXYzine (ATARAX/VISTARIL) 25 MG tablet Take 25 mg by mouth every 6 (six) hours as needed for anxiety, itching, nausea or vomiting.    [provider]  levocetirizine (XYZAL) 5 MG tablet Take 5 mg by mouth every evening.    [provider]  levothyroxine (SYNTHROID, LEVOTHROID) 75 MCG tablet Take 75 mcg by mouth daily before breakfast.     [provider]  magnesium oxide (MAG-OX) 400 MG tablet Take 400 mg by mouth 2 (two) times daily.     [provider]  MOUNJARO 7.5 MG/0.5ML Pen Inject 12.5 mg into the skin once a week. 02/15/22   [provider]  nadolol (CORGARD) 20 MG tablet Take 1 tablet (20 mg total) by mouth daily. 12/20/22   End, Cristal Deer, MD  NONFORMULARY OR COMPOUNDED ITEM Nifedipine 0.3% plus lidocaine 2% cream apply to rectum BID and after every BM.    [provider]  NOVOLOG FLEXPEN 100 UNIT/ML FlexPen Inject 48-68 Units into the skin 3 (three) times daily with meals. Based on sliding scale 08/14/19   [provider]  potassium chloride SA (K-DUR,KLOR-CON) 20 MEQ tablet Take 20 mEq by mouth 2 (two) times daily.     [provider]  saccharomyces boulardii (FLORASTOR) 250 MG capsule Take 250 mg by  mouth 2 (two) times daily.     [provider]  simvastatin (ZOCOR) 20 MG tablet Take 20 mg by mouth at bedtime.    [provider]  spironolactone (ALDACTONE) 25 MG tablet TAKE 1/2 TABLET BY MOUTH DAILY 05/03/22   End, Cristal Deer, MD  torsemide (DEMADEX) 20 MG tablet Take 20 mg by mouth daily.    [provider]  [facility]  FLEXTOUCH 200 UNIT/ML FlexTouch Pen 194 Unit(s) SUB-Q Daily    [provider]  valACYclovir (VALTREX) 500 MG tablet Take 500 mg by mouth 2 (two) times daily as needed. 02/10/23   [provider]     Vitals:   03/02/23 2300 03/02/23 2330 03/03/23 0000 03/03/23 0207  BP: 123/60 130/60 (!) 110/48   Pulse: 63 64 63   Resp:      Temp:    98.1 F (36.7 C)  TempSrc:    Oral  SpO2: 97% 96% 98%   Weight:      Height:       Physical Exam Vitals and nursing note reviewed.  Constitutional:      General: She is not in acute distress. HENT:     Head: Normocephalic and atraumatic.     Right Ear: Hearing normal.     Left Ear: Hearing normal.     Nose: Nose normal. No nasal deformity.     Mouth/Throat:     Lips: Pink.     Tongue: No lesions.     Pharynx: Oropharynx is clear.  Eyes:     General: Lids are normal.     Extraocular Movements: Extraocular movements intact.  Cardiovascular:     Rate and Rhythm: Normal rate and regular rhythm.     Heart sounds: Normal heart sounds.  Pulmonary:     Effort: Pulmonary effort is normal.     Breath sounds: Normal breath sounds.  Abdominal:     General: Bowel sounds are normal. There is no distension.     Palpations: Abdomen is soft. There is no mass.     Tenderness: There is abdominal tenderness.  Musculoskeletal:     Right lower leg: No edema.     Left lower leg: No edema.  Skin:    General: Skin is warm.  Neurological:     General: No focal deficit present.     Mental Status: She is alert and oriented to person, place, and time.     Cranial Nerves: Cranial nerves 2-12 are  intact.  Psychiatric:        Attention and Perception: Attention normal.        Mood and Affect: Mood normal.        Speech: Speech normal.        Behavior: Behavior normal. Behavior is cooperative.    Labs on Admission: I have personally reviewed following labs and imaging studies Results for orders placed or performed during the hospital encounter of 03/02/23 (from the past 24 hours)  CBC with Differential     Status: Abnormal   Collection Time: 03/02/23  2:55 PM  Result Value Ref Range   WBC 1.6 (L) 4.0 - 10.5 K/uL   RBC 3.01 (L) 3.87 - 5.11 MIL/uL   Hemoglobin 9.6 (L) 12.0 - 15.0 g/dL   HCT 08.6 (L) 57.8 - 46.9 %   MCV 96.3 80.0 - 100.0 fL   MCH 31.9 26.0 - 34.0 pg   MCHC 33.1 30.0 - 36.0 g/dL   RDW 62.9 (H) 52.8 - 41.3 %   Platelets 54 (L) 150 - 400 K/uL   nRBC 0.0 0.0 - 0.2 %   Neutrophils Relative % 51 %   Neutro Abs 0.8 (L) 1.7 - 7.7 K/uL   Lymphocytes Relative 33 %   Lymphs Abs 0.5 (L) 0.7 - 4.0 K/uL   Monocytes Relative 10 %   Monocytes Absolute 0.2 0.1 - 1.0 K/uL   Eosinophils Relative 5 %  Eosinophils Absolute 0.1 0.0 - 0.5 K/uL   Basophils Relative 1 %   Basophils Absolute 0.0 0.0 - 0.1 K/uL   WBC Morphology MORPHOLOGY UNREMARKABLE    RBC Morphology MORPHOLOGY UNREMARKABLE    Smear Review PLATELETS APPEAR DECREASED    Immature Granulocytes 0 %   Abs Immature Granulocytes 0.00 0.00 - 0.07 K/uL  Comprehensive metabolic panel     Status: Abnormal   Collection Time: 03/02/23  2:55 PM  Result Value Ref Range   Sodium 137 135 - 145 mmol/L   Potassium 3.4 (L) 3.5 - 5.1 mmol/L   Chloride 106 98 - 111 mmol/L   CO2 25 22 - 32 mmol/L   Glucose, Bld 136 (H) 70 - 99 mg/dL   BUN 13 8 - 23 mg/dL   Creatinine, Ser 8.29 0.44 - 1.00 mg/dL   Calcium 8.6 (L) 8.9 - 10.3 mg/dL   Total Protein 7.5 6.5 - 8.1 g/dL   Albumin 3.3 (L) 3.5 - 5.0 g/dL   AST 35 15 - 41 U/L   ALT 20 0 - 44 U/L   Alkaline Phosphatase 42 38 - 126 U/L   Total Bilirubin 1.0 0.0 - 1.2 mg/dL   GFR,  Estimated >56 >21 mL/min   Anion gap 6 5 - 15  Type and screen [facility]     Status: None   Collection Time: 03/02/23  2:55 PM  Result Value Ref Range   ABO/RH(D) A POS    Antibody Screen NEG    Sample Expiration      03/05/2023,2359 Performed at [facility], [address] [place], [place] [address]   Lipase, blood     Status: None   Collection Time: 03/02/23  2:55 PM  Result Value Ref Range   Lipase 43 11 - 51 U/L  CBC with Differential     Status: Abnormal   Collection Time: 03/02/23  8:42 PM  Result Value Ref Range   WBC 1.6 (L) 4.0 - 10.5 K/uL   RBC 3.07 (L) 3.87 - 5.11 MIL/uL   Hemoglobin 9.8 (L) 12.0 - 15.0 g/dL   HCT 78.4 (L) 69.6 - 29.5 %   MCV 96.4 80.0 - 100.0 fL   MCH 31.9 26.0 - 34.0 pg   MCHC 33.1 30.0 - 36.0 g/dL   RDW 28.4 (H) 13.2 - 44.0 %   Platelets 55 (L) 150 - 400 K/uL   nRBC 0.0 0.0 - 0.2 %   Neutrophils Relative % 45 %   Neutro Abs 0.7 (L) 1.7 - 7.7 K/uL   Lymphocytes Relative 37 %   Lymphs Abs 0.6 (L) 0.7 - 4.0 K/uL   Monocytes Relative 11 %   Monocytes Absolute 0.2 0.1 - 1.0 K/uL   Eosinophils Relative 6 %   Eosinophils Absolute 0.1 0.0 - 0.5 K/uL   Basophils Relative 1 %   Basophils Absolute 0.0 0.0 - 0.1 K/uL   WBC Morphology MORPHOLOGY UNREMARKABLE    RBC Morphology MORPHOLOGY UNREMARKABLE    Smear Review PLATELETS APPEAR DECREASED    Immature Granulocytes 0 %   Abs Immature Granulocytes 0.00 0.00 - 0.07 K/uL  CBG monitoring, ED     Status: Abnormal   Collection Time: 03/03/23 12:56 AM  Result Value Ref Range   Glucose-Capillary 119 (H) 70 - 99 mg/dL   No results found for this or any previous visit (from the past 720 hours). CBC:    Latest Ref Rng & Units 03/02/2023    8:42 PM 03/02/2023  2:55 PM 01/31/2023    5:55 AM  CBC  WBC 4.0 - 10.5 K/uL 1.6  1.6  1.5   Hemoglobin 12.0 - 15.0 g/dL 9.8  9.6  8.3   Hematocrit 36.0 - 46.0 % 29.6  29.0  24.8   Platelets 150 - 400 K/uL 55  54  64    Basic  Metabolic Panel: Recent Labs  Lab 03/02/23 1455  NA 137  K 3.4*  CL 106  CO2 25  GLUCOSE 136*  BUN 13  CREATININE 0.73  CALCIUM 8.6*   Creatinine: Lab Results  Component Value Date   CREATININE 0.73 03/02/2023   CREATININE 0.65 01/31/2023   CREATININE 0.72 01/30/2023   Liver Function Tests:    Latest Ref Rng & Units 03/02/2023    2:55 PM 01/28/2023    7:16 PM 11/04/2022    9:34 AM  Hepatic Function  Total Protein 6.5 - 8.1 g/dL 7.5  7.9  7.7   Albumin 3.5 - 5.0 g/dL 3.3  3.3  3.4   AST 15 - 41 U/L 35  30  32   ALT 0 - 44 U/L 20  14  20    Alk Phosphatase 38 - 126 U/L 42  55  47   Total Bilirubin 0.0 - 1.2 mg/dL 1.0  1.1  0.7    Coagulation Profile: No results for input(s): "INR", "PROTIME" in the last 168 hours. Cardiac Enzymes: No results for input(s): "CKTOTAL", "CKMB", "CKMBINDEX", "TROPONINI" in the last 168 hours. BNP (last 3 results) No results for input(s): "PROBNP" in the last 8760 hours. HbA1C: No results for input(s): "HGBA1C" in the last 72 hours. Lipid Profile: No results for input(s): "CHOL", "HDL", "LDLCALC", "TRIG", "CHOLHDL", "LDLDIRECT" in the last 72 hours.  Radiological Exams on Admission: CT ANGIO GI BLEED Result Date: 03/03/2023 CLINICAL DATA:  abdominal pain EXAM: CTA ABDOMEN AND PELVIS WITHOUT AND WITH CONTRAST TECHNIQUE: Multidetector CT imaging of the abdomen and pelvis was performed using the standard protocol during bolus administration of intravenous contrast. Multiplanar reconstructed images and MIPs were obtained and reviewed to evaluate the vascular anatomy. RADIATION DOSE REDUCTION: This exam was performed according to the departmental dose-optimization program which includes automated exposure control, adjustment of the mA and/or kV according to patient size and/or use of iterative reconstruction technique. CONTRAST:  OMNIPAQUE IOHEXOL 350 MG/ML SOLN COMPARISON:  09/11/2022 FINDINGS: VASCULAR Aorta: Normal caliber. No aneurysm or  dissection. Moderate atherosclerotic calcification. No periaortic inflammatory stranding or fluid collections are identified. Celiac: Patent without evidence of aneurysm, dissection, vasculitis or significant stenosis. SMA: Patent without evidence of aneurysm, dissection, vasculitis or significant stenosis. 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 No aneurysm or dissection IMA: Patent without evidence of aneurysm, dissection, vasculitis or significant stenosis. Inflow: Patent without evidence of aneurysm, dissection, vasculitis or significant stenosis. Proximal Outflow: Bilateral common femoral and visualized portions of the superficial and profunda femoral arteries are patent without evidence of aneurysm, dissection, vasculitis or significant stenosis. Veins: Evidence of portal venous hypertension is identified with recanalization of the umbilical vein and numerous gastroesophageal varices identified. The portal vein is patent. The abdominal venous vasculature is otherwise unremarkable. Review of the MIP images confirms the above findings. NON-VASCULAR Lower chest: There is focal consolidation within the basilar right middle lobe as well as scattered interstitial pulmonary infiltrates within the visualized lower lobes bilaterally which may be infectious or inflammatory.  Pacemaker leads are seen within the right heart. 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 No enhancing intrahepatic mass. No intra or extrahepatic biliary ductal dilation. Status post cholecystectomy. Pancreas: Unremarkable Spleen: Moderate splenomegaly with the spleen measuring 17.9 cm in greatest dimension appears stable since prior examination. No intrasplenic lesions are seen. Adrenals/Urinary Tract: The adrenal  glands are unremarkable. Kidneys are normal. Bladder is unremarkable. Stomach/Bowel: Mural thickening without associated hyperemia involving the ascending colon likely reflects changes of portal colopathy though a superimposed infectious or inflammatory colitis could appear similarly. Stomach, small bowel, and large bowel are otherwise unremarkable save for mild sigmoid diverticulosis. The appendix is absent. Mild ascites. No free intraperitoneal gas. Lymphatic: There is shotty gastrohepatic, periceliac, periportal, and left periaortic and aortocaval adenopathy present with the index lymph node measuring 14 mm in short axis diameter within the periceliac lymph node groups, stable since prior examination. This is nonspecific. No new pathologic adenopathy within the abdomen and pelvis. Reproductive: Status post hysterectomy. No adnexal masses. Other: No abdominal wall hernia Musculoskeletal: The osseous structures are age-appropriate. No acute bone abnormality IMPRESSION: 1. No acute intra-abdominal pathology identified. No definite radiographic explanation for the patient's reported symptoms. 2. 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 3. 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 Moderate splenomegaly. Mild ascites. 4. 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 5. Focal consolidation within the basilar right middle lobe as well as scattered interstitial pulmonary infiltrates within the visualized lower lobes bilaterally which may be infectious or inflammatory. 6. 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 Aortic Atherosclerosis (ICD10-I70.0). Electronically Signed   By: Helyn Numbers  M.D.   On: 03/03/2023 01:54   DG Chest Port 1 View Result Date: 03/02/2023 CLINICAL DATA:  Cough and rectal bleeding EXAM: PORTABLE CHEST 1 VIEW COMPARISON:  02/07/2023 FINDINGS: Stable cardiomediastinal silhouette. Aortic atherosclerotic calcification. Right chest wall pacemaker. Similar patchy airspace opacities in the right middle lobe and lingula. No pleural effusion or pneumothorax. IMPRESSION: Similar patchy airspace opacities in the right middle lobe and lingula. Electronically Signed   By: Minerva Fester M.D.   On: 03/02/2023 23:49    Data Reviewed: Relevant notes from primary care and specialist visits, past discharge summaries as available in EHR, including Care Everywhere. Prior diagnostic testing as pertinent to current admission diagnoses, Updated medications and problem lists for reconciliation ED course, including vitals, labs, imaging, treatment and response to treatment,Triage notes, nursing and pharmacy notes and ED provider's notes Notable results as noted in HPI.Discussed case with EDMD/ ED APP/ or Specialty MD on call and as needed.  >>Assessment and Plan: * GI bleeding Will admit patient to PCU. H&H every 4. Type/screen/cross as deemed appropriate and transfuse as needed.. Patient continued on octreotide and Protonix and GI consult message sent.   ABLA (acute blood loss anemia) Type/ screen IV PPI. Octreotide.  Cta abs shows ? Colitis plz refer to complete report.    Pancytopenia (HCC)    Latest Ref Rng & Units 03/02/2023    8:42 PM 03/02/2023    2:55 PM 01/31/2023    5:55 AM  CBC  WBC 4.0 - 10.5 K/uL 1.6  1.6  1.5   Hemoglobin 12.0 - 15.0 g/dL 9.8  9.6  8.3   Hematocrit 36.0 - 46.0 % 29.6  29.0  24.8   Platelets 150 - 400 K/uL 55  54  64   Being followed by hematology oncology outpatient Dr. Donneta Romberg per previous note.   Electrolyte abnormality Pharmacy consult   Abdominal pain Patient reports 10 out of 10 abdominal pain in light of her anemia  rectal bleeding we will proceed with angio eval for GI bleed. Currently patient is n.p.o. with IV PPI and octreotide drip. Will also evaluate for any ischemic concerns or vascular concerns.  Lactic acid ordered and pending.  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 Monitor closely patient to follow-up with GI outpatient closely.   Type 2 diabetes mellitus without complications (HCC) N.p.o., glycemic protocol with Accu-Cheks every 4 hourly.  Pneumonia On CTA abd pt has focal consolidation in right middle lobe and bl lower lobes.  Cont rocephin.  CTA report needs to be d/w pt I got results as I was leaving and will defer to am team.      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  Patient's last device check was in November 2024 by Dr. Steffanie Dunn cardiology.  Chronic heart failure with preserved ejection fraction (HFpEF) (HCC) Last echo in 09/2022: 1. No LVOT obstruction. Left ventricular ejection fraction, by estimation, is 55 to 60% . The left ventricle has normal function. The left ventricle has no regional wall motion abnormalities. There is severe asymmetric left ventricular hypertrophy of the septal segment. Left ventricular diastolic parameters are consistent with Grade II diastolic dysfunction ( pseudonormalization) .  2. Right ventricular systolic function is normal. The right ventricular size is normal.  3. Left atrial size was severely dilated.  4. The mitral valve has been repaired/ replaced. Mild mitral valve regurgitation. The mean mitral valve gradient is 4. 0 mmHg.  5. The aortic valve is tricuspid. Aortic valve regurgitation is not visualized. Aortic valve sclerosis/ calcification is present, without any evidence of aortic stenosis.  Clinically euvolemic.  CHF stable.    HTN (hypertension) Vitals:   03/02/23 1450 03/02/23 1912  BP: (!) 139/59 (!) 116/56  In  light of her soft blood pressures and risk of hypotension overnight we will hold patient's Demadex Aldactone and nadolol.   Hypothyroidism Home regimen consist levothyroxine at 75 we will keep patient n.p.o. for now we will resume her levothyroxine postprocedure .    DVT prophylaxis:  SCD Consults:  Gi: Dr.Russo . Advance Care Planning:    Code Status: Full Code   Family Communication:  None Disposition Plan:  Home Severity of Illness: The appropriate patient status for this patient is INPATIENT. Inpatient status is judged to be reasonable and necessary in order to provide the required intensity of service to ensure the patient's safety. The patient's presenting symptoms, physical exam findings, and initial radiographic and laboratory data in the context of their chronic comorbidities is felt to place them at high risk for further clinical deterioration. Furthermore, it is not anticipated that the patient will be medically stable for discharge from the hospital within 2 midnights of admission.   * I certify that at the point of admission it is my clinical judgment that the patient will require inpatient hospital care spanning beyond 2 midnights from the point of admission due to high intensity of service, high risk for further deterioration and high frequency of surveillance required.*  Author: Gertha Calkin, MD 03/03/2023  2:37 AM  For on call review [email or web address].   Unresulted Labs (From admission, onward)     Start     Ordered   03/03/23 0500  CBC with Differential/Platelet  Tomorrow morning,   R        03/03/23 0236   03/03/23 0500  Comprehensive metabolic panel  Tomorrow morning,   R        03/03/23 0236   03/03/23 0108  Lactic acid, plasma  (Lactic Acid)  STAT Now then every 3 hours,   STAT      03/03/23 0107   03/02/23 1455  Pathologist smear review  Once,   STAT        03/02/23 1455            Orders Placed This Encounter  Procedures   DG Chest Port 1 View   CT  ANGIO GI BLEED   CBC with Differential   Comprehensive metabolic panel   Pathologist smear review   CBC with Differential   Lipase, blood   Lactic acid, plasma   CBC with Differential/Platelet   Comprehensive metabolic panel   Diet NPO time specified   Apply Diabetes Mellitus Care Plan   STAT CBG when hypoglycemia is suspected. If treated, recheck every 15 minutes after each treatment until CBG >/= 70 mg/dl   Refer to Hypoglycemia Protocol Sidebar Report for treatment of CBG < 70 mg/dl   No HS correction Insulin   SCDs   Cardiac Monitoring Continuous x 24 hours Indications for use: Other; other indications for use: Monitor for ischemia   Vital signs   Notify physician (specify)   Mobility Protocol: No Restrictions   Refer to Sidebar Report Refer to ICU, Med-Surg, Progressive, and Step-Down Mobility Protocol Sidebars   Initiate Adult Central Line Maintenance and Catheter Protocol for patients with central line (CVC, PICC, Port, Hemodialysis, Trialysis)   If patient diabetic or glucose greater than 140 notify physician for Sliding Scale Insulin Orders   Intake and Output   Do not place and if present remove PureWick   Initiate Oral Care Protocol   Initiate Carrier Fluid Protocol   RN may order General Admission PRN Orders utilizing "General Admission PRN medications" (through manage orders) for the following patient needs: allergy symptoms (Claritin), cold sores (Carmex), cough (Robitussin DM), eye irritation (Liquifilm Tears), hemorrhoids (Tucks), indigestion (Maalox), minor skin irritation (Hydrocortisone Cream), muscle pain Romeo Apple Gay), nose irritation (saline nasal spray) and sore throat (Chloraseptic spray).   Full code   Consult to hospitalist  [phone]   Electrolytes Per Pharmacy Consult [facility] Only)   Pulse oximetry check with vital signs   Oxygen therapy Mode or (Route): Nasal cannula; Liters Per Minute: 2; Keep O2 saturation between: greater than 92 %   CBG monitoring, ED   Type and  screen [facility]   Admit to Inpatient (patient's expected length of stay will be greater than 2 midnights or inpatient only procedure)   Aspiration precautions   Fall precautions

## 2023-03-02 NOTE — ED Triage Notes (Signed)
Pt c/o rectal bleeding that started this morning. 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 Pt was admitted in December for the same. Pt said they cauterized one near her umbilicus. Pt reports pain in her rectum and her abdomen.

## 2023-03-03 ENCOUNTER — Inpatient Hospital Stay: Payer: Medicare PPO

## 2023-03-03 DIAGNOSIS — R109 Unspecified abdominal pain: Secondary | ICD-10-CM | POA: Diagnosis present

## 2023-03-03 DIAGNOSIS — K625 Hemorrhage of anus and rectum: Secondary | ICD-10-CM | POA: Diagnosis not present

## 2023-03-03 DIAGNOSIS — E878 Other disorders of electrolyte and fluid balance, not elsewhere classified: Secondary | ICD-10-CM | POA: Diagnosis present

## 2023-03-03 DIAGNOSIS — Z8719 Personal history of other diseases of the digestive system: Secondary | ICD-10-CM

## 2023-03-03 DIAGNOSIS — D649 Anemia, unspecified: Secondary | ICD-10-CM | POA: Diagnosis not present

## 2023-03-03 LAB — COMPREHENSIVE METABOLIC PANEL
ALT: 19 U/L (ref 0–44)
AST: 33 U/L (ref 15–41)
Albumin: 3 g/dL — ABNORMAL LOW (ref 3.5–5.0)
Alkaline Phosphatase: 41 U/L (ref 38–126)
Anion gap: 9 (ref 5–15)
BUN: 13 mg/dL (ref 8–23)
CO2: 24 mmol/L (ref 22–32)
Calcium: 8.2 mg/dL — ABNORMAL LOW (ref 8.9–10.3)
Chloride: 105 mmol/L (ref 98–111)
Creatinine, Ser: 0.83 mg/dL (ref 0.44–1.00)
GFR, Estimated: >60 mL/min (ref >60)
Glucose, Bld: 119 mg/dL — ABNORMAL HIGH (ref 70–99)
Potassium: 3 mmol/L — ABNORMAL LOW (ref 3.5–5.1)
Sodium: 138 mmol/L (ref 135–145)
Total Bilirubin: 1 mg/dL (ref 0.0–1.2)
Total Protein: 6.8 g/dL (ref 6.5–8.1)

## 2023-03-03 LAB — HEMOGLOBIN AND HEMATOCRIT, BLOOD
HCT: 27.1 % — ABNORMAL LOW (ref 36.0–46.0)
Hemoglobin: 8.9 g/dL — ABNORMAL LOW (ref 12.0–15.0)

## 2023-03-03 LAB — CBG MONITORING, ED
Glucose-Capillary: 100 mg/dL — ABNORMAL HIGH (ref 70–99)
Glucose-Capillary: 115 mg/dL — ABNORMAL HIGH (ref 70–99)
Glucose-Capillary: 119 mg/dL — ABNORMAL HIGH (ref 70–99)
Glucose-Capillary: 74 mg/dL (ref 70–99)

## 2023-03-03 LAB — PATHOLOGIST SMEAR REVIEW

## 2023-03-03 LAB — MAGNESIUM: Magnesium: 2.2 mg/dL (ref 1.7–2.4)

## 2023-03-03 LAB — CBC WITH DIFFERENTIAL/PLATELET
Abs Immature Granulocytes: 0 10*3/uL (ref 0.00–0.07)
Basophils Absolute: 0 10*3/uL (ref 0.0–0.1)
Basophils Relative: 1 %
Eosinophils Absolute: 0.1 10*3/uL (ref 0.0–0.5)
Eosinophils Relative: 6 %
HCT: 26.5 % — ABNORMAL LOW (ref 36.0–46.0)
Hemoglobin: 8.7 g/dL — ABNORMAL LOW (ref 12.0–15.0)
Immature Granulocytes: 0 %
Lymphocytes Relative: 38 %
Lymphs Abs: 0.5 10*3/uL — ABNORMAL LOW (ref 0.7–4.0)
MCH: 31.5 pg (ref 26.0–34.0)
MCHC: 32.8 g/dL (ref 30.0–36.0)
MCV: 96 fL (ref 80.0–100.0)
Monocytes Absolute: 0.2 10*3/uL (ref 0.1–1.0)
Monocytes Relative: 13 %
Neutro Abs: 0.6 10*3/uL — ABNORMAL LOW (ref 1.7–7.7)
Neutrophils Relative %: 42 %
Platelets: 49 10*3/uL — ABNORMAL LOW (ref 150–400)
RBC: 2.76 MIL/uL — ABNORMAL LOW (ref 3.87–5.11)
RDW: 15.9 % — ABNORMAL HIGH (ref 11.5–15.5)
Smear Review: DECREASED
WBC: 1.4 10*3/uL — CL (ref 4.0–10.5)
nRBC: 0 % (ref 0.0–0.2)

## 2023-03-03 LAB — HEMOGLOBIN A1C
Hgb A1c MFr Bld: 5.6 % (ref 4.8–5.6)
Mean Plasma Glucose: 114.02 mg/dL

## 2023-03-03 LAB — LIPASE, BLOOD: Lipase: 43 U/L (ref 11–51)

## 2023-03-03 LAB — LACTIC ACID, PLASMA: Lactic Acid, Venous: 1 mmol/L (ref 0.5–1.9)

## 2023-03-03 MED ORDER — POTASSIUM CHLORIDE 10 MEQ/100ML IV SOLN
10.0000 meq | INTRAVENOUS | Status: AC
  Administered 2023-03-03 (×4): 10 meq via INTRAVENOUS
  Filled 2023-03-03 (×4): qty 100

## 2023-03-03 MED ORDER — INSULIN ASPART 100 UNIT/ML IJ SOLN
0.0000 [IU] | Freq: Three times a day (TID) | INTRAMUSCULAR | Status: DC
Start: 2023-03-03 — End: 2023-03-04

## 2023-03-03 MED ORDER — IOHEXOL 350 MG/ML SOLN
100.0000 mL | Freq: Once | INTRAVENOUS | Status: AC | PRN
  Administered 2023-03-03: 100 mL via INTRAVENOUS

## 2023-03-03 MED ORDER — FENTANYL CITRATE PF 50 MCG/ML IJ SOSY
12.5000 ug | PREFILLED_SYRINGE | Freq: Four times a day (QID) | INTRAMUSCULAR | Status: AC | PRN
Start: 2023-03-03 — End: 2023-03-04
  Administered 2023-03-03 – 2023-03-04 (×3): 12.5 ug via INTRAVENOUS
  Filled 2023-03-03 (×3): qty 1

## 2023-03-03 NOTE — ED Notes (Signed)
RN assisted pt to bathroom, x1 urination. Pt stand by assist. Back into bed denies other needs at this time

## 2023-03-03 NOTE — Assessment & Plan Note (Signed)
Pharmacy consult

## 2023-03-03 NOTE — Progress Notes (Signed)
PHARMACY CONSULT NOTE - FOLLOW UP  Pharmacy Consult for Electrolyte Monitoring and Replacement   Recent Labs: Potassium (mmol/L)  Date Value  03/03/2023 3.0 (L)   Magnesium (mg/dL)  Date Value  16/11/9602 2.1   Calcium (mg/dL)  Date Value  54/10/8117 8.2 (L)   Albumin (g/dL)  Date Value  14/78/2956 3.0 (L)  11/30/2020 3.8   Sodium (mmol/L)  Date Value  03/03/2023 138  10/03/2022 138     Assessment: 1/20 @ 0317:  K = 3.0  Goal of Therapy:  Electrolytes WNL   Plan:  KCl 10 mEq IV X 4 ordered for 1/20 @ 0500. - Will recheck electrolytes on 1/20 @ 1800.   Scherrie Gerlach ,PharmD Clinical Pharmacist 03/03/2023 5:04 AM

## 2023-03-03 NOTE — ED Notes (Signed)
Pt reported nausea, see MAR. Pt comfortable and denies other needs

## 2023-03-03 NOTE — Consult Note (Signed)
Midge Minium, MD [facility]  [address] [place], [place] [address] Phone: [phone] Fax : [phone]  Consultation  Referring Provider:     Dr. Joylene Igo Primary Care Physician:  Jaclyn Shaggy, MD Primary Gastroenterologist:  Dr. Servando Snare         Reason for Consultation:     Rectal bleeding  Date of Admission:  03/02/2023 Date of Consultation:  03/03/2023         HPI:   Annette Foster is a 83 y.o. female with a history of cirrhosis and varices who came in with rectal bleeding with the last bout of rectal bleeding being yesterday afternoon.  The patient had a CT angiography that did not show any signs of active bleeding.  The patient has been admitted in the past for dark stools and an upper endoscopy showed some large angiodysplastic lesion with bleeding in the duodenal bulb.  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  The patient's blood count showed:  Component     Latest Ref Rng 01/31/2023 03/02/2023 03/03/2023  Hemoglobin     12.0 - 15.0 g/dL 8.3 (L)  9.8 (L)  8.9 (L)   Hemoglobin       9.6 (L)  8.7 (L)    Which is very near her baseline with a white cell count significantly low at 1.4.  The patient also had a CT angiography that showed:  IMPRESSION: 1. No acute intra-abdominal pathology identified. No definite radiographic explanation for the patient's reported symptoms. 2. 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 3. 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 Moderate splenomegaly. Mild ascites. 4. 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 5. Focal consolidation within the basilar right middle lobe as  well as scattered interstitial pulmonary infiltrates within the visualized lower lobes bilaterally which may be infectious or inflammatory. 6. 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  The patient reports that she has had some diffuse abdominal pain.  The patient denies any hematemesis.  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 The patient denies any hematemesis and reports that the rectal bleeding was bright red in color.  Past Medical History:  Diagnosis Date   Abdominal pain    Allergy    Cancer (HCC) 2017   breast cancer- Left   Cataract    CHF (congestive heart failure) (HCC)    Collagenous colitis    Coronary artery disease    Diabetes mellitus without complication (HCC)    Diarrhea    Diverticulosis    Dysrhythmia    Fatty liver disease, nonalcoholic    Fibrocystic breast    GERD (gastroesophageal reflux disease)    Heart murmur    Hyperlipidemia    Hypertension    Hypothyroidism    IBS (irritable bowel syndrome)    IDA (iron deficiency anemia)    Liver cirrhosis (HCC)    Personal history of radiation therapy 2017   LEFT BREAST CA   PONV (postoperative nausea and vomiting)    Presence of permanent cardiac pacemaker    Sleep apnea    C-Pap    Past Surgical History:  Procedure  Laterality Date   ABDOMINAL HYSTERECTOMY     tah bso   ABDOMINAL SURGERY     APPENDECTOMY     AV NODE ABLATION N/A 05/17/2021   Procedure: AV NODE ABLATION;  Surgeon: Lanier Prude, MD;  [facility];  Service: Cardiovascular;  Laterality: N/A;   BREAST BIOPSY Bilateral 2016   negative   BREAST 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/2015   DCIS, papillary carcinoma in situ   BREAST BIOPSY Left 05/27/2016   BENIGN MAMMARY EPITHELIUM   BREAST BIOPSY Left 11/20/2017   affirm bx x clip BENIGN MAMMARY EPITHELIUM CONSISTENT WITH RAD THERAPY   BREAST  EXCISIONAL BIOPSY Right    NEG 1980's   BREAST LUMPECTOMY Left 10/17/2015   DCIS and papillary carcinoma insitu, clear margins   CARDIAC SURGERY     has replacement valve   CATARACT EXTRACTION Right    CATARACT EXTRACTION W/PHACO Left 01/16/2021   Procedure: CATARACT EXTRACTION PHACO AND INTRAOCULAR LENS PLACEMENT (IOC) LEFT DIABETIC 8.46 00:59.8;  Surgeon: Galen Manila, MD;  Location: [facility];  Service: Ophthalmology;  Laterality: Left;   CHOLECYSTECTOMY     COLONOSCOPY WITH PROPOFOL N/A 03/11/2016   Procedure: COLONOSCOPY WITH PROPOFOL;  Surgeon: Scot Jun, MD;  Location: [facility] ENDOSCOPY;  Service: Endoscopy;  Laterality: N/A;   COLONOSCOPY WITH PROPOFOL N/A 02/18/2020   Procedure: COLONOSCOPY WITH PROPOFOL;  Surgeon: Midge Minium, MD;  Location: [facility] ENDOSCOPY;  Service: Endoscopy;  Laterality: N/A;   COLONOSCOPY WITH PROPOFOL N/A 09/10/2022   Procedure: COLONOSCOPY WITH PROPOFOL;  Surgeon: Wyline Mood, MD;  Location: [facility] ENDOSCOPY;  Service: Gastroenterology;  Laterality: N/A;   ESOPHAGEAL BANDING  09/10/2022   Procedure: ESOPHAGEAL BANDING;  Surgeon: Wyline Mood, MD;  Location: [facility] ENDOSCOPY;  Service: Gastroenterology;;   ESOPHAGEAL BANDING  10/17/2022   Procedure: ESOPHAGEAL BANDING;  Surgeon: Midge Minium, MD;  Location: [facility] ENDOSCOPY;  Service: Endoscopy;;   ESOPHAGEAL BANDING  12/24/2022   Procedure: ESOPHAGEAL BANDING;  Surgeon: Midge Minium, MD;  Location: [facility] ENDOSCOPY;  Service: Endoscopy;;   ESOPHAGOGASTRODUODENOSCOPY (EGD) WITH PROPOFOL N/A 03/11/2016   Procedure: ESOPHAGOGASTRODUODENOSCOPY (EGD) WITH PROPOFOL;  Surgeon: Scot Jun, MD;  Location: [facility] ENDOSCOPY;  Service: Endoscopy;  Laterality: N/A;   ESOPHAGOGASTRODUODENOSCOPY (EGD) WITH PROPOFOL N/A 02/18/2020   Procedure: ESOPHAGOGASTRODUODENOSCOPY (EGD) WITH PROPOFOL;  Surgeon: Midge Minium, MD;  Location: [facility] ENDOSCOPY;  Service: Endoscopy;  Laterality: N/A;   ESOPHAGOGASTRODUODENOSCOPY  (EGD) WITH PROPOFOL N/A 04/25/2020   Procedure: ESOPHAGOGASTRODUODENOSCOPY (EGD) WITH PROPOFOL;  Surgeon: Midge Minium, MD;  Location: [facility] ENDOSCOPY;  Service: Endoscopy;  Laterality: N/A;   ESOPHAGOGASTRODUODENOSCOPY (EGD) WITH PROPOFOL N/A 05/23/2020   Procedure: ESOPHAGOGASTRODUODENOSCOPY (EGD) WITH PROPOFOL;  Surgeon: Midge Minium, MD;  Location: [facility] ENDOSCOPY;  Service: Endoscopy;  Laterality: N/A;   ESOPHAGOGASTRODUODENOSCOPY (EGD) WITH PROPOFOL N/A 09/10/2022   Procedure: ESOPHAGOGASTRODUODENOSCOPY (EGD) WITH PROPOFOL;  Surgeon: Wyline Mood, MD;  Location: [facility] ENDOSCOPY;  Service: Gastroenterology;  Laterality: N/A;   ESOPHAGOGASTRODUODENOSCOPY (EGD) WITH PROPOFOL N/A 10/17/2022   Procedure: ESOPHAGOGASTRODUODENOSCOPY (EGD) WITH PROPOFOL;  Surgeon: Midge Minium, MD;  Location: [facility] ENDOSCOPY;  Service: Endoscopy;  Laterality: N/A;   ESOPHAGOGASTRODUODENOSCOPY (EGD) WITH PROPOFOL N/A 12/24/2022   Procedure: ESOPHAGOGASTRODUODENOSCOPY (EGD) WITH PROPOFOL;  Surgeon: Midge Minium, MD;  Location: [facility] ENDOSCOPY;  Service: Endoscopy;  Laterality: N/A;   ESOPHAGOGASTRODUODENOSCOPY (EGD) WITH PROPOFOL N/A 01/29/2023   Procedure: ESOPHAGOGASTRODUODENOSCOPY (EGD) WITH PROPOFOL;  Surgeon: Regis Bill, MD;  Location: [facility] ENDOSCOPY;  Service: Endoscopy;  Laterality: N/A;  Ideally we would intubate if possible given concern for  varices   EUS N/A 10/04/2021   Procedure: LOWER ENDOSCOPIC ULTRASOUND (EUS);  Surgeon: Doren Custard, MD;  Location: [facility] ENDOSCOPY;  Service: Gastroenterology;  Laterality: N/A;  [facility]   EYE SURGERY     FINGER ARTHROSCOPY WITH CARPOMETACARPEL (CMC) ARTHROPLASTY Right 09/03/2018   Procedure: CARPOMETACARPEL [facility]) ARTHROPLASTY RIGHT THUMB;  Surgeon: Kennedy Bucker, MD;  Location: [facility] ORS;  Service: Orthopedics;  Laterality: Right;   FLEXIBLE SIGMOIDOSCOPY N/A 09/14/2021   Procedure: FLEXIBLE SIGMOIDOSCOPY;  Surgeon: Midge Minium, MD;  Location: [facility] ENDOSCOPY;   Service: Endoscopy;  Laterality: N/A;  No anesthesia   GANGLION CYST EXCISION Right 09/03/2018   Procedure: REMOVAL GANGLION OF WRIST;  Surgeon: Kennedy Bucker, MD;  Location: [facility] ORS;  Service: Orthopedics;  Laterality: Right;   HARDWARE REMOVAL Right 09/03/2018   Procedure: HARDWARE REMOVAL RIGHT THUMB;  Surgeon: Kennedy Bucker, MD;  Location: [facility] ORS;  Service: Orthopedics;  Laterality: Right;  staple removed   HEMOSTASIS CLIP PLACEMENT  09/10/2022   Procedure: HEMOSTASIS CLIP PLACEMENT;  Surgeon: Wyline Mood, MD;  Location: [facility] ENDOSCOPY;  Service: Gastroenterology;;   HEMOSTASIS CONTROL  09/10/2022   Procedure: HEMOSTASIS CONTROL;  Surgeon: Wyline Mood, MD;  Location: [facility] ENDOSCOPY;  Service: Gastroenterology;;   HEMOSTASIS CONTROL  01/29/2023   Procedure: HEMOSTASIS CONTROL;  Surgeon: Regis Bill, MD;  Location: [facility] ENDOSCOPY;  Service: Endoscopy;;   JOINT REPLACEMENT Left    TKR   left sinusplasty      MASTECTOMY, PARTIAL Left 10/17/2015   Procedure: MASTECTOMY PARTIAL REVISION;  Surgeon: Nadeen Landau, MD;  Location: [facility] ORS;  Service: General;  Laterality: Left;   PACEMAKER IMPLANT N/A 03/23/2021   Procedure: PACEMAKER IMPLANT;  Surgeon: Lanier Prude, MD;  [facility];  Service: Cardiovascular;  Laterality: N/A;   PARTIAL MASTECTOMY WITH NEEDLE LOCALIZATION Left 09/29/2015   Procedure: PARTIAL MASTECTOMY WITH NEEDLE LOCALIZATION;  Surgeon: Nadeen Landau, MD;  Location: [facility] ORS;  Service: General;  Laterality: Left;   POLYPECTOMY  09/10/2022   Procedure: POLYPECTOMY;  Surgeon: Wyline Mood, MD;  Location: [facility] ENDOSCOPY;  Service: Gastroenterology;;   RECTAL EXAM UNDER ANESTHESIA N/A 10/05/2021   Procedure: RECTAL EXAM UNDER ANESTHESIA, ASPIRATION OF RECTAL CYST;  Surgeon: Carolan Shiver, MD;  Location: [facility] ORS;  Service: General;  Laterality: N/A;   TOTAL ABDOMINAL HYSTERECTOMY W/ BILATERAL SALPINGOOPHORECTOMY      Prior to  Admission medications   Medication Sig Start Date End Date Taking? Authorizing Provider  Azelastine HCl 137 MCG/SPRAY SOLN INHALE 1-2 PUFFS IN EACH NOSTRIL NASALLY ONCE A DAY 30 DAY(S)   Yes [provider]  benzonatate (TESSALON) 100 MG capsule Take 100 mg by mouth 3 (three) times daily as needed. 10/28/22  Yes [provider]  Cholecalciferol (VITAMIN D) 50 MCG (2000 UT) CAPS Take 2,000 Units by mouth daily.   Yes [provider]  clobetasol ointment (TEMOVATE) 0.05 % Apply 1 application topically as needed (vaginal irritation).   Yes [provider]  clotrimazole (LOTRIMIN) 1 % cream Apply 1 Application topically 2 (two) times daily as needed.   Yes [provider]  cyanocobalamin (,VITAMIN B-12,) 1000 MCG/ML injection 1,000 mcg every 30 (thirty) days. 04/23/19  Yes [provider]  dicyclomine (BENTYL) 10 MG capsule Take 10 mg by mouth in the morning, at noon, and at bedtime. 05/16/19  Yes [provider]  docusate sodium (COLACE) 100 MG capsule Take 100 mg by mouth 3 (three) times daily as needed for moderate constipation. 01/10/20  Yes [provider]  esomeprazole (NEXIUM) 40 MG capsule Take 40 mg by mouth 2 (two) times daily before a meal.  02/08/16  Yes [provider]  fenofibrate 160 MG tablet Take 160 mg by mouth at bedtime.   Yes [provider]  gabapentin (NEURONTIN) 300 MG capsule Take 300 mg by mouth 2 (two) times daily. 09/07/16 10/25/36 Yes Jaclyn Shaggy, MD  levocetirizine (XYZAL) 5 MG tablet Take 5 mg by mouth every evening.   Yes [provider]  levothyroxine (SYNTHROID, LEVOTHROID) 75 MCG tablet Take 75 mcg by mouth daily before breakfast.    Yes [provider]  magnesium oxide (MAG-OX) 400 MG tablet Take 400 mg by mouth 2 (two) times daily.    Yes [provider]  nadolol (CORGARD) 20 MG tablet Take 1 tablet (20 mg total) by mouth daily. 12/20/22  Yes End,  Cristal Deer, MD  NONFORMULARY OR COMPOUNDED ITEM Nifedipine 0.3% plus lidocaine 2% cream apply to rectum BID and after every BM.   Yes [provider]  NOVOLOG FLEXPEN 100 UNIT/ML FlexPen Inject 48-68 Units into the skin 3 (three) times daily with meals. Based on sliding scale 08/14/19  Yes [provider]  potassium chloride SA (K-DUR,KLOR-CON) 20 MEQ tablet Take 20 mEq by mouth 2 (two) times daily.    Yes [provider]  saccharomyces boulardii (FLORASTOR) 250 MG capsule Take 250 mg by mouth 2 (two) times daily.    Yes [provider]  simvastatin (ZOCOR) 20 MG tablet Take 20 mg by mouth at bedtime.   Yes [provider]  spironolactone (ALDACTONE) 25 MG tablet TAKE 1/2 TABLET BY MOUTH DAILY 05/03/22  Yes End, Cristal Deer, MD  torsemide (DEMADEX) 20 MG tablet Take 20 mg by mouth daily.   Yes [provider]  TRESIBA FLEXTOUCH 200 UNIT/ML FlexTouch Pen 200 Units at bedtime. 194 Unit(s) SUB-Q Daily   Yes [provider]  valACYclovir (VALTREX) 500 MG tablet Take 500 mg by mouth 2 (two) times daily as needed. 02/10/23  Yes [provider]  cyclobenzaprine (FLEXERIL) 5 MG tablet Take 1 tablet by mouth 2 (two) times daily. Patient not taking: Reported on 10/17/2022    [provider]  estradiol (ESTRACE) 0.1 MG/GM vaginal cream Place 1 Applicatorful vaginally daily as needed (irritation). Patient not taking: Reported on 03/03/2023    [provider]  hydrOXYzine (ATARAX/VISTARIL) 25 MG tablet Take 25 mg by mouth every 6 (six) hours as needed for anxiety, itching, nausea or vomiting. Patient not taking: Reported on 03/03/2023    [provider]  [facility] 15 MG/0.5ML Pen Inject 15 mg into the skin once a week.    [provider]  MOUNJARO 7.5 MG/0.5ML Pen Inject 12.5 mg into the skin once a week. Patient not taking: Reported on 03/03/2023 02/15/22   [provider]    Family History  Problem  Relation Age of Onset   Breast cancer Paternal Grandmother    Colon cancer Father    Diabetes Sister    Diabetes Brother    Heart disease Brother    Prostate cancer Brother    Colon cancer Maternal Uncle    Prostate cancer Brother    Bladder Cancer Brother    Leukemia Mother        all   Ovarian cancer Neg Hx    Kidney cancer Neg Hx      Social History   Tobacco Use   Smoking status: Never   Smokeless tobacco:  Never  Vaping Use   Vaping status: Never Used  Substance Use Topics   Alcohol use: Never   Drug use: Never    Allergies as of 03/02/2023 - Review Complete 03/02/2023  Allergen Reaction Noted   Codeine Itching, Nausea And Vomiting, and Other (See Comments) 06/28/2014   Demeclocycline Rash 04/10/2012   Demerol [meperidine] Itching and Nausea And Vomiting 06/28/2014   Hydrocodone Itching and Nausea And Vomiting 08/24/2018   Other Other (See Comments) 04/10/2012   Oxycodone Itching and Nausea And Vomiting 09/26/2015   Pentazocine Itching, Nausea And Vomiting, and Other (See Comments) 04/10/2012   Tetracyclines & related Rash 06/28/2014   Coal tar extract Other (See Comments) 03/19/2022   Hydrocodone-acetaminophen Other (See Comments) 01/30/2018   Salicylic acid Other (See Comments) 04/02/2022   Tetracycline hcl Other (See Comments) 04/02/2022   Fentanyl Itching, Nausea And Vomiting, and Other (See Comments) 06/28/2014    Review of Systems:    All systems reviewed and negative except where noted in HPI.   Physical Exam:  Vital signs in last 24 hours: Temp:  [98.1 F (36.7 C)-98.3 F (36.8 C)] 98.3 F (36.8 C) (01/20 0734) Pulse Rate:  [61-78] 61 (01/20 1535) Resp:  [16] 16 (01/20 0600) BP: (110-134)/(48-74) 126/58 (01/20 1535) SpO2:  [89 %-100 %] 99 % (01/20 1535)   General:   Pleasant, cooperative in NAD Head:  Normocephalic and atraumatic. Eyes:   No icterus.   Conjunctiva pink. PERRLA. Ears:  Normal auditory acuity. Neck:  Supple; no masses or  thyroidomegaly Lungs: Respirations even and unlabored. Lungs clear to auscultation bilaterally.   No wheezes, crackles, or rhonchi.  Heart:  Regular rate and rhythm;  Without murmur, clicks, rubs or gallops Abdomen:  Soft, nondistended, nontender. Normal bowel sounds. No appreciable masses or hepatomegaly.  No rebound or guarding.  Rectal:  Not performed. Msk:  Symmetrical without gross deformities.   Extremities:  Without edema, cyanosis or clubbing. Neurologic:  Alert and oriented x3;  grossly normal neurologically. Skin:  Intact without significant lesions or rashes. Cervical Nodes:  No significant cervical adenopathy. Psych:  Alert and cooperative. Normal affect.  LAB RESULTS: Recent Labs    03/02/23 1455 03/02/23 2042 03/03/23 0317 03/03/23 1125  WBC 1.6* 1.6* 1.4*  --   HGB 9.6* 9.8* 8.7* 8.9*  HCT 29.0* 29.6* 26.5* 27.1*  PLT 54* 55* 49*  --    BMET Recent Labs    03/02/23 1455 03/03/23 0317  NA 137 138  K 3.4* 3.0*  CL 106 105  CO2 25 24  GLUCOSE 136* 119*  BUN 13 13  CREATININE 0.73 0.83  CALCIUM 8.6* 8.2*   LFT Recent Labs    03/03/23 0317  PROT 6.8  ALBUMIN 3.0*  AST 33  ALT 19  ALKPHOS 41  BILITOT 1.0   PT/INR No results for input(s): "LABPROT", "INR" in the last 72 hours.  STUDIES: CT ANGIO GI BLEED Result Date: 03/03/2023 CLINICAL DATA:  abdominal pain EXAM: CTA ABDOMEN AND PELVIS WITHOUT AND WITH CONTRAST TECHNIQUE: Multidetector CT imaging of the abdomen and pelvis was performed using the standard protocol during bolus administration of intravenous contrast. Multiplanar reconstructed images and MIPs were obtained and reviewed to evaluate the vascular anatomy. RADIATION DOSE REDUCTION: This exam was performed according to the departmental dose-optimization program which includes automated exposure control, adjustment of the mA and/or kV according to patient size and/or use of iterative reconstruction technique. CONTRAST:  OMNIPAQUE IOHEXOL  350 MG/ML SOLN COMPARISON:  09/11/2022 FINDINGS: VASCULAR  Aorta: Normal caliber. No aneurysm or dissection. Moderate atherosclerotic calcification. No periaortic inflammatory stranding or fluid collections are identified. Celiac: Patent without evidence of aneurysm, dissection, vasculitis or significant stenosis. SMA: Patent without evidence of aneurysm, dissection, vasculitis or significant stenosis. 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 No aneurysm or dissection IMA: Patent without evidence of aneurysm, dissection, vasculitis or significant stenosis. Inflow: Patent without evidence of aneurysm, dissection, vasculitis or significant stenosis. Proximal Outflow: Bilateral common femoral and visualized portions of the superficial and profunda femoral arteries are patent without evidence of aneurysm, dissection, vasculitis or significant stenosis. Veins: Evidence of portal venous hypertension is identified with recanalization of the umbilical vein and numerous gastroesophageal varices identified. The portal vein is patent. The abdominal venous vasculature is otherwise unremarkable. Review of the MIP images confirms the above findings. NON-VASCULAR Lower chest: There is focal consolidation within the basilar right middle lobe as well as scattered interstitial pulmonary infiltrates within the visualized lower lobes bilaterally which may be infectious or inflammatory. Pacemaker leads are seen within the right heart. 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 No enhancing intrahepatic mass. No intra or extrahepatic biliary ductal dilation. Status post cholecystectomy. Pancreas: Unremarkable Spleen: Moderate splenomegaly with the spleen measuring 17.9 cm in greatest dimension appears stable  since prior examination. No intrasplenic lesions are seen. Adrenals/Urinary Tract: The adrenal glands are unremarkable. Kidneys are normal. Bladder is unremarkable. Stomach/Bowel: Mural thickening without associated hyperemia involving the ascending colon likely reflects changes of portal colopathy though a superimposed infectious or inflammatory colitis could appear similarly. Stomach, small bowel, and large bowel are otherwise unremarkable save for mild sigmoid diverticulosis. The appendix is absent. Mild ascites. No free intraperitoneal gas. Lymphatic: There is shotty gastrohepatic, periceliac, periportal, and left periaortic and aortocaval adenopathy present with the index lymph node measuring 14 mm in short axis diameter within the periceliac lymph node groups, stable since prior examination. This is nonspecific. No new pathologic adenopathy within the abdomen and pelvis. Reproductive: Status post hysterectomy. No adnexal masses. Other: No abdominal wall hernia Musculoskeletal: The osseous structures are age-appropriate. No acute bone abnormality IMPRESSION: 1. No acute intra-abdominal pathology identified. No definite radiographic explanation for the patient's reported symptoms. 2. 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 3. 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 Moderate splenomegaly. Mild ascites. 4. 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 5. Focal consolidation within the basilar right middle lobe as well as scattered interstitial pulmonary infiltrates within the visualized lower lobes bilaterally which may be infectious or inflammatory. 6. Small apical pseudoaneurysm within the left  ventricular apex. Aortic Atherosclerosis (ICD10-I70.0). Electronically Signed   By: Helyn Numbers M.D.   On: 03/03/2023 01:54   DG Chest Port 1 View Result Date: 03/02/2023 CLINICAL DATA:  Cough and rectal bleeding EXAM: PORTABLE CHEST 1 VIEW COMPARISON:  02/07/2023 FINDINGS: Stable cardiomediastinal silhouette. Aortic atherosclerotic calcification. Right chest wall pacemaker. Similar patchy airspace opacities in the right middle lobe and lingula. No pleural effusion or pneumothorax. IMPRESSION: Similar patchy airspace opacities in the right middle lobe and lingula. Electronically Signed   By: Angelique Holm.D.  On: 03/02/2023 23:49      Impression / Plan:   Assessment: Principal Problem:   GI bleeding Active Problems:   Pancytopenia (HCC)   Hypothyroidism   HTN (hypertension)   Chronic heart failure with preserved ejection fraction (HFpEF) (HCC)   Pacemaker   Pneumonia   Type 2 diabetes mellitus without complications (HCC)   Non-alcoholic fatty liver disease   ABLA (acute blood loss anemia)   Abdominal pain   Electrolyte abnormality   Annette Foster is a 83 y.o. y/o female with bright red blood per rectum with a history of a colonoscopy by me back in 2022 with internal hemorrhoids.  The patient had another colonoscopy in July 2024 about 6 months ago.  Plan:  The patient does not have any sign of any active GI bleeding with a negative CT angiography and no further bowel movements since yesterday.  Her hemoglobin is slightly low but not far off from her baseline.  I have spoken to the patient about her numerous previous endoscopies and colonoscopies and that since she has stopped bleeding at this time I would continue to watch her.  If she should have any further sign of GI bleeding then we may intervene with luminal evaluation at that time.  The patient has been explained the plan and agrees with it.  Thank you for involving me in the care of this patient.      LOS: 1 day    Midge Minium, MD, MD. Clementeen Graham 03/03/2023, 6:13 PM,  Pager [phone] 7am-5pm  Check AMION for 5pm -7am coverage and on weekends   Note: This dictation was prepared with Dragon dictation along with smaller phrase technology. Any transcriptional errors that result from this process are unintentional.

## 2023-03-03 NOTE — Assessment & Plan Note (Signed)
Patient reports 10 out of 10 abdominal pain in light of her anemia rectal bleeding we will proceed with angio eval for GI bleed. Currently patient is n.p.o. with IV PPI and octreotide drip. Will also evaluate for any ischemic concerns or vascular concerns.  Lactic acid ordered and pending.

## 2023-03-03 NOTE — ED Notes (Addendum)
RN to bedside to introduce self to pt. Pt is is resting and in no acute distress. Pt is 88% on RA. This RN woke her up and placed her on [place] at 2 liters. No call bell in place and no bed alarm on. This RN gave pt call bell and turned on fall alarm.

## 2023-03-03 NOTE — Assessment & Plan Note (Signed)
Type/ screen IV PPI. Octreotide.  Cta abs shows ? Colitis plz refer to complete report.

## 2023-03-03 NOTE — Progress Notes (Signed)
Progress Note   Patient: Annette Foster YQM:[id number] DOB: [birth year] DOA: 03/02/2023     1 DOS: the patient was seen and examined on 03/03/2023   Brief hospital course:  Annette Foster is a 83 y.o. female with past medical history  of  type 2 diabetes mellitus, CHF, GERD, GI bleed, allergies to multiple medications 13 listed please review the list, hypertension, dyslipidemia, hypothyroidism, IBS and OSA on CPAP, who presented to the emergency room for rectal bleeding.  Patient today also reported abdominal pain and perirectal pain.   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   ED evaluation  so far shows: Metabolic panel showing potassium of 3.4 glucose 136 albumin 3.3 normal LFTs electrolytes and kidney functions. CBC shows pancytopenia with a hemoglobin of 9.8 WBC of 1.6 hematocrit of 29.6 MCV 96.4 RDW of 15.9 platelet count of 55. 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 Moderate splenomegaly. Mild ascites. 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   Patient's last device check was in November 2024 by Dr. Steffanie Dunn cardiology.    Chronic heart failure with preserved ejection fraction (HFpEF) (HCC) Clinically euvolemic.  CHF stable.  Last echo in 09/2022: 1. No LVOT obstruction. Left ventricular ejection fraction, by estimation, is 55 to 60% . The left ventricle has normal function. The left ventricle has no regional wall motion abnormalities. There is severe asymmetric left ventricular hypertrophy of the septal segment. Left ventricular diastolic parameters are consistent with Grade II diastolic dysfunction ( pseudonormalization)      HTN (hypertension) Aldactone, torsemide and nadolol on hold since patient is normotensive     Hypothyroidism Continue Synthroid        Subjective: No further episodes of bleeding.  N.p.o. for upper endoscopy  Physical Exam: Vitals:   03/03/23 0920 03/03/23 0923 03/03/23 0930 03/03/23 1000  BP:   (!) 113/53 120/60  Pulse:  65 63 66  Resp:      Temp:      TempSrc:      SpO2: (S) (!) 89% 92% 92% 96%  Weight:      Height:       Vitals and nursing note reviewed.  Constitutional:      General: She is not in acute distress. HENT:     Head: Normocephalic and atraumatic.     Right Ear: Hearing normal.  Left Ear: Hearing normal.     Nose: Nose normal. No nasal deformity.     Mouth/Throat:     Lips: Pink.     Tongue: No lesions.     Pharynx: Oropharynx is clear.  Eyes:     General: Lids are normal.     Extraocular Movements: Extraocular movements intact.  Cardiovascular:     Rate and Rhythm: Normal rate and regular rhythm.     Heart sounds: Normal heart sounds.  Pulmonary:     Effort: Pulmonary  effort is normal.     Breath sounds: Normal breath sounds.  Abdominal:     General: Bowel sounds are normal. There is no distension.     Palpations: Abdomen is soft. There is no mass.     Tenderness: There is periumbilical abdominal tenderness.  Musculoskeletal:     Right lower leg: No edema.     Left lower leg: No edema.  Skin:    General: Skin is warm.  Neurological:     General: No focal deficit present.     Mental Status: She is alert and oriented to person, place, and time.     Cranial Nerves: Cranial nerves 2-12 are intact.  Psychiatric:        Attention and Perception: Attention normal.        Mood and Affect: Mood normal.        Speech: Speech normal.        Behavior: Behavior normal. Behavior is cooperative.      Data Reviewed: Labs reviewed.  White count 1.4, hemoglobin 8.7, platelet count 49, potassium 3.0 There are no new results to review at this time.  Family Communication: Plan of care discussed with patient in detail.  She verbalizes understanding and agrees with the plan.  Disposition: Status is: Inpatient Remains inpatient appropriate because: Further evaluation of acute blood loss anemia  Planned Discharge Destination: Home    Time spent: 36 minutes  Author: Lucile Shutters, MD 03/03/2023 3:32 PM  For on call review [email or web address].

## 2023-03-03 NOTE — ED Notes (Signed)
Pt oxygen back at 91% RA, RN will monitor pt oxygen saturations. Upon RN arrival at 7am pt was on 2 L but stated it was bothering her ears and wanted it removed. Pt is tolerating RA at this time will continue to monitor

## 2023-03-03 NOTE — Assessment & Plan Note (Signed)
On CTA abd pt has focal consolidation in right middle lobe and bl lower lobes.  Cont rocephin.  CTA report needs to be d/w pt I got results as I was leaving and will defer to am team.

## 2023-03-04 DIAGNOSIS — K625 Hemorrhage of anus and rectum: Secondary | ICD-10-CM | POA: Diagnosis not present

## 2023-03-04 DIAGNOSIS — Z8719 Personal history of other diseases of the digestive system: Secondary | ICD-10-CM | POA: Diagnosis not present

## 2023-03-04 DIAGNOSIS — R109 Unspecified abdominal pain: Secondary | ICD-10-CM | POA: Diagnosis not present

## 2023-03-04 LAB — CBC
HCT: 25.5 % — ABNORMAL LOW (ref 36.0–46.0)
Hemoglobin: 8.5 g/dL — ABNORMAL LOW (ref 12.0–15.0)
MCH: 31.7 pg (ref 26.0–34.0)
MCHC: 33.3 g/dL (ref 30.0–36.0)
MCV: 95.1 fL (ref 80.0–100.0)
Platelets: 53 10*3/uL — ABNORMAL LOW (ref 150–400)
RBC: 2.68 MIL/uL — ABNORMAL LOW (ref 3.87–5.11)
RDW: 15.7 % — ABNORMAL HIGH (ref 11.5–15.5)
WBC: 1.4 10*3/uL — CL (ref 4.0–10.5)
nRBC: 0 % (ref 0.0–0.2)

## 2023-03-04 LAB — CBG MONITORING, ED
Glucose-Capillary: 97 mg/dL (ref 70–99)
Glucose-Capillary: 121 mg/dL — ABNORMAL HIGH (ref 70–99)

## 2023-03-04 LAB — BASIC METABOLIC PANEL
Anion gap: 5 (ref 5–15)
BUN: 13 mg/dL (ref 8–23)
CO2: 25 mmol/L (ref 22–32)
Calcium: 8 mg/dL — ABNORMAL LOW (ref 8.9–10.3)
Chloride: 109 mmol/L (ref 98–111)
Creatinine, Ser: 0.73 mg/dL (ref 0.44–1.00)
GFR, Estimated: >60 mL/min (ref >60)
Glucose, Bld: 92 mg/dL (ref 70–99)
Potassium: 3.4 mmol/L — ABNORMAL LOW (ref 3.5–5.1)
Sodium: 139 mmol/L (ref 135–145)

## 2023-03-04 LAB — LACTIC ACID, PLASMA: Lactic Acid, Venous: 1.2 mmol/L (ref 0.5–1.9)

## 2023-03-04 MED ORDER — INSULIN DEGLUDEC 200 UNIT/ML ~~LOC~~ SOPN: 194.0000 [IU] | PEN_INJECTOR | Freq: Every day | SUBCUTANEOUS | 1 refills | Status: AC

## 2023-03-04 MED ORDER — POTASSIUM CHLORIDE 10 MEQ/100ML IV SOLN
10.0000 meq | INTRAVENOUS | Status: AC
  Administered 2023-03-04 (×2): 10 meq via INTRAVENOUS
  Filled 2023-03-04: qty 100

## 2023-03-04 MED ORDER — POTASSIUM CHLORIDE CRYS ER 20 MEQ PO TBCR
40.0000 meq | EXTENDED_RELEASE_TABLET | Freq: Once | ORAL | Status: AC
  Administered 2023-03-04: 40 meq via ORAL
  Filled 2023-03-04: qty 2

## 2023-03-04 MED ORDER — MOUNJARO 15 MG/0.5ML ~~LOC~~ SOAJ: 15.0000 mg | SUBCUTANEOUS | 0 refills | Status: AC

## 2023-03-04 NOTE — Progress Notes (Signed)
PHARMACY CONSULT NOTE - FOLLOW UP  Pharmacy Consult for Electrolyte Monitoring and Replacement   Recent Labs: Potassium (mmol/L)  Date Value  03/04/2023 3.4 (L)   Magnesium (mg/dL)  Date Value  96/05/5407 2.2   Calcium (mg/dL)  Date Value  81/19/1478 8.0 (L)   Albumin (g/dL)  Date Value  29/56/2130 3.0 (L)  11/30/2020 3.8   Sodium (mmol/L)  Date Value  03/04/2023 139  10/03/2022 138   Corrected calcium: 8.8  Assessment: 1/20 @ 0317:  K = 3.0 1/21 @ 0248: K = 3.4  Goal of Therapy:  Electrolytes WNL   Plan:  Give KCl 10 mEq IV X 2  Check electrolytes with AM labs  Merryl Hacker ,PharmD Clinical Pharmacist 03/04/2023 7:12 AM

## 2023-03-04 NOTE — Progress Notes (Signed)
Patient ambulated tot he bathroom independently. Patient denies any bm only voided.

## 2023-03-04 NOTE — ED Notes (Signed)
Assumed patient care at approximately 0720 and received report from the previous nurse.

## 2023-03-04 NOTE — Discharge Summary (Signed)
Physician Discharge Summary   Patient: Annette Foster MRN: [id number] DOB: 12/25/1940  Admit date:     03/02/2023  Discharge date: 03/04/23  Discharge Physician: Aland Chestnutt   PCP: Jaclyn Shaggy, MD   Recommendations at discharge:   Check blood sugars Keep scheduled follow-up appointment with primary care provider  Discharge Diagnoses: Principal Problem:   Rectal bleeding Active Problems:   Pancytopenia (HCC)   ABLA (acute blood loss anemia)   Hypothyroidism   HTN (hypertension)   Chronic heart failure with preserved ejection fraction (HFpEF) (HCC)   Pacemaker   Type 2 diabetes mellitus without complications (HCC)   Non-alcoholic fatty liver disease   Abdominal pain   Electrolyte abnormality  Resolved Problems:   * No resolved hospital problems. *  Hospital Course:  Annette Foster is a 83 y.o. female with past medical history  of  type 2 diabetes mellitus, CHF, GERD, GI bleed, allergies to multiple medications 13 listed please review the list, hypertension, dyslipidemia, hypothyroidism, IBS and OSA on CPAP, who presented to the emergency room for rectal bleeding.  Patient today also reported abdominal pain and perirectal pain.   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   ED evaluation  so far shows: Metabolic panel showing potassium of 3.4 glucose 136 albumin 3.3 normal LFTs electrolytes and kidney functions. CBC shows pancytopenia with a hemoglobin of 9.8 WBC of 1.6 hematocrit of 29.6 MCV 96.4 RDW of 15.9 platelet count of 55.   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 Moderate splenomegaly. Mild ascites. 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 Patient was placed on IV octreotide and Protonix  H&H has remained stable without need for blood transfusion Appreciate GI consult.  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   Patient's last device check was in November 2024 by Dr. Steffanie Dunn cardiology.     Chronic heart failure with preserved ejection fraction (HFpEF) (HCC) Clinically euvolemic.  CHF stable.  Last echo in 09/2022: 1. No LVOT obstruction. Left ventricular ejection fraction, by estimation, is 55 to 60% . The left ventricle has normal function. The left ventricle has no regional wall motion abnormalities. There is severe asymmetric left ventricular hypertrophy of the septal segment. Left ventricular diastolic parameters are consistent with Grade II diastolic dysfunction ( pseudonormalization)       HTN (hypertension) Continue Aldactone, torsemide and nadolol      Hypothyroidism Continue Synthroid  Abdominal pain Unclear etiology Ischemic bowel ruled out with normal lactic acid levels and CT angiogram negative for stenosis Seen by GI during this hospitalization        Consultants: Gastroenterology Procedures performed: None Disposition: Home Diet recommendation:  Discharge Diet Orders (From admission, onward)     Start     Ordered   03/04/23 0000  Diet - low sodium heart healthy        03/04/23 1254   03/04/23 0000  Diet Carb Modified        03/04/23 1254           Cardiac and Carb modified diet DISCHARGE MEDICATION: Allergies as of 03/04/2023       Reactions   Codeine Itching, Nausea And Vomiting, Other (See Comments)   Demeclocycline Rash   Demerol [meperidine] Itching, Nausea And Vomiting   Hydrocodone Itching, Nausea And Vomiting   Morphine Nausea Only   Other Other (See Comments)   Oxycodone Itching, Nausea And Vomiting   Pentazocine Itching, Nausea And Vomiting, Other (See Comments)   Tetracyclines & Related Rash   Coal Tar Extract Other (See Comments)   Hydrocodone-acetaminophen Other (See Comments)   Salicylic Acid Other (See Comments)   Tetracycline Hcl Other (See Comments)        Medication List     STOP taking these medications    cyclobenzaprine 5 MG tablet Commonly known as: FLEXERIL   estradiol 0.1 MG/GM vaginal cream Commonly known as: ESTRACE   hydrOXYzine 25 MG tablet Commonly known as: ATARAX   NovoLOG FlexPen 100 UNIT/ML FlexPen Generic drug: insulin aspart       TAKE these medications    Azelastine HCl 137 MCG/SPRAY Soln INHALE 1-2 PUFFS IN EACH NOSTRIL NASALLY ONCE A DAY 30 DAY(S)   benzonatate 100 MG capsule Commonly known as: TESSALON Take 100 mg by mouth 3 (three) times daily as needed.   clobetasol ointment 0.05 % Commonly known as: TEMOVATE Apply 1 application topically as needed (vaginal irritation).   clotrimazole 1 % cream Commonly known as: LOTRIMIN Apply 1 Application topically 2 (two) times daily as needed.   cyanocobalamin 1000 MCG/ML injection Commonly known as: VITAMIN B12 1,000 mcg every 30 (thirty) days.   dicyclomine 10 MG capsule Commonly known as:  BENTYL Take 10 mg by mouth in the morning, at noon, and at bedtime.   docusate sodium 100 MG capsule Commonly known as: COLACE Take 100 mg by mouth 3 (three) times daily as needed for moderate constipation.   esomeprazole 40 MG capsule Commonly known as: NEXIUM Take 40 mg by mouth 2 (two) times daily before a meal.   fenofibrate 160 MG tablet Take 160 mg by mouth at bedtime.   gabapentin 300 MG capsule Commonly known as: NEURONTIN Take 300 mg by mouth 2 (two) times daily.   insulin degludec 200 UNIT/ML FlexTouch Pen Commonly known as: TRESIBA Inject 194 Units into the skin at bedtime. What changed:  how much to take how to take this additional instructions   levocetirizine 5 MG tablet Commonly known as: XYZAL Take 5 mg by mouth every evening.   levothyroxine 75 MCG tablet Commonly known as: SYNTHROID Take 75 mcg by mouth daily before breakfast.   magnesium oxide 400 MG tablet Commonly known as: MAG-OX Take 400 mg by mouth 2 (two) times daily.   Mounjaro 7.5 MG/0.5ML Pen Generic drug: tirzepatide Inject 12.5 mg into the skin once a week.   Mounjaro 15 MG/0.5ML Pen Generic drug: tirzepatide Inject 15 mg  into the skin once a week.   nadolol 20 MG tablet Commonly known as: CORGARD Take 1 tablet (20 mg total) by mouth daily.   NONFORMULARY OR COMPOUNDED ITEM Nifedipine 0.3% plus lidocaine 2% cream apply to rectum BID and after every BM.   potassium chloride SA 20 MEQ tablet Commonly known as: KLOR-CON M Take 20 mEq by mouth 2 (two) times daily.   saccharomyces boulardii 250 MG capsule Commonly known as: FLORASTOR Take 250 mg by mouth 2 (two) times daily.   simvastatin 20 MG tablet Commonly known as: ZOCOR Take 20 mg by mouth at bedtime.   spironolactone 25 MG tablet Commonly known as: ALDACTONE TAKE 1/2 TABLET BY MOUTH DAILY   torsemide 20 MG tablet Commonly known as: DEMADEX Take 20 mg by mouth daily.   valACYclovir 500 MG tablet Commonly known  as: VALTREX Take 500 mg by mouth 2 (two) times daily as needed.   Vitamin D 50 MCG (2000 UT) Caps Take 2,000 Units by mouth daily.        Discharge Exam: Filed Weights   03/02/23 1453  Weight: 60.3 kg   Constitutional:      General: She is not in acute distress. HENT:     Head: Normocephalic and atraumatic.     Right Ear: Hearing normal.     Left Ear: Hearing normal.     Nose: Nose normal. No nasal deformity.     Mouth/Throat:     Lips: Pink.     Tongue: No lesions.     Pharynx: Oropharynx is clear.  Eyes:     General: Lids are normal.     Extraocular Movements: Extraocular movements intact.  Cardiovascular:     Rate and Rhythm: Normal rate and regular rhythm.     Heart sounds: Normal heart sounds.  Pulmonary:     Effort: Pulmonary effort is normal.     Breath sounds: Normal breath sounds.  Abdominal:     General: Bowel sounds are normal. There is no distension.     Palpations: Abdomen is soft. There is no mass.     Tenderness: There is RUQ abdominal tenderness. (Improved from admission but persist.)  Patient is status post cholecystectomy Musculoskeletal:     Right lower leg: No edema.     Left lower leg: No edema.  Skin:    General: Skin is warm.  Neurological:     General: No focal deficit present.     Mental Status: She is alert and oriented to person, place, and time.     Cranial Nerves: Cranial nerves 2-12 are intact.  Psychiatric:        Attention and Perception: Attention normal.        Mood and Affect: Mood normal.        Speech: Speech normal.        Behavior: Behavior normal. Behavior is cooperative.      Condition at discharge: stable  The results of significant diagnostics from this hospitalization (including imaging, microbiology, ancillary and laboratory) are listed below for reference.   Imaging Studies: CT ANGIO GI BLEED Result Date: 03/03/2023 CLINICAL DATA:  abdominal pain EXAM: CTA ABDOMEN AND PELVIS WITHOUT AND WITH CONTRAST  TECHNIQUE: Multidetector CT imaging of the abdomen and pelvis was performed using the standard protocol during bolus administration of intravenous contrast. Multiplanar reconstructed images and MIPs were obtained and reviewed to evaluate the vascular anatomy. RADIATION DOSE REDUCTION: This exam was performed according to the departmental dose-optimization program which includes automated exposure  control, adjustment of the mA and/or kV according to patient size and/or use of iterative reconstruction technique. CONTRAST:  OMNIPAQUE IOHEXOL 350 MG/ML SOLN COMPARISON:  09/11/2022 FINDINGS: VASCULAR Aorta: Normal caliber. No aneurysm or dissection. Moderate atherosclerotic calcification. No periaortic inflammatory stranding or fluid collections are identified. Celiac: Patent without evidence of aneurysm, dissection, vasculitis or significant stenosis. SMA: Patent without evidence of aneurysm, dissection, vasculitis or significant stenosis. 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 No aneurysm or dissection IMA: Patent without evidence of aneurysm, dissection, vasculitis or significant stenosis. Inflow: Patent without evidence of aneurysm, dissection, vasculitis or significant stenosis. Proximal Outflow: Bilateral common femoral and visualized portions of the superficial and profunda femoral arteries are patent without evidence of aneurysm, dissection, vasculitis or significant stenosis. Veins: Evidence of portal venous hypertension is identified with recanalization of the umbilical vein and numerous gastroesophageal varices identified. The portal vein is patent. The abdominal venous vasculature is otherwise unremarkable. Review of the MIP images confirms the above findings. NON-VASCULAR Lower chest: There is focal consolidation within the basilar right middle lobe as  well as scattered interstitial pulmonary infiltrates within the visualized lower lobes bilaterally which may be infectious or inflammatory. Pacemaker leads are seen within the right heart. 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 No enhancing intrahepatic mass. No intra or extrahepatic biliary ductal dilation. Status post cholecystectomy. Pancreas: Unremarkable Spleen: Moderate splenomegaly with the spleen measuring 17.9 cm in greatest dimension appears stable since prior examination. No intrasplenic lesions are seen. Adrenals/Urinary Tract: The adrenal glands are unremarkable. Kidneys are normal. Bladder is unremarkable. Stomach/Bowel: Mural thickening without associated hyperemia involving the ascending colon likely reflects changes of portal colopathy though a superimposed infectious or inflammatory colitis could appear similarly. Stomach, small bowel, and large bowel are otherwise unremarkable save for mild sigmoid diverticulosis. The appendix is absent. Mild ascites. No free intraperitoneal gas. Lymphatic: There is shotty gastrohepatic, periceliac, periportal, and left periaortic and aortocaval adenopathy present with the index lymph node measuring 14 mm in short axis diameter within the periceliac lymph node groups, stable since prior examination. This is nonspecific. No new pathologic adenopathy within the abdomen and pelvis. Reproductive: Status post hysterectomy. No adnexal masses. Other: No abdominal wall hernia Musculoskeletal: The osseous structures are age-appropriate. No acute bone abnormality IMPRESSION: 1. No acute intra-abdominal pathology identified. No definite radiographic explanation for the patient's reported symptoms. 2. 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 3. Cirrhosis with evidence of portal venous hypertension including recanalization  of the umbilical vein and numerous gastroesophageal varices. Moderate splenomegaly. Mild ascites. 4. 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 5. Focal consolidation within the basilar right middle lobe as well as scattered interstitial pulmonary infiltrates within the visualized lower lobes bilaterally which may be infectious or inflammatory. 6. 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 Aortic Atherosclerosis (ICD10-I70.0). Electronically Signed   By: Helyn Numbers M.D.   On: 03/03/2023 01:54   DG Chest Port 1 View Result Date: 03/02/2023 CLINICAL DATA:  Cough and rectal bleeding EXAM: PORTABLE CHEST 1 VIEW COMPARISON:  02/07/2023 FINDINGS: Stable cardiomediastinal silhouette. Aortic atherosclerotic calcification. Right chest wall pacemaker. Similar patchy airspace opacities in the right middle  lobe and lingula. No pleural effusion or pneumothorax. IMPRESSION: Similar patchy airspace opacities in the right middle lobe and lingula. Electronically Signed   By: Minerva Fester M.D.   On: 03/02/2023 23:49   MM 3D SCREENING MAMMOGRAM BILATERAL BREAST Result Date: 02/15/2023 CLINICAL DATA:  Screening. EXAM: DIGITAL SCREENING BILATERAL MAMMOGRAM WITH TOMOSYNTHESIS AND CAD TECHNIQUE: Bilateral screening digital craniocaudal and mediolateral oblique mammograms were obtained. Bilateral screening digital breast tomosynthesis was performed. The images were evaluated with computer-aided detection. COMPARISON:  Previous exam(s). ACR Breast Density Category c: The breasts are heterogeneously dense, which may obscure small masses. FINDINGS: There are no findings suspicious for malignancy. IMPRESSION: No mammographic evidence of malignancy. A result letter of this screening mammogram will be mailed directly to the patient. RECOMMENDATION: Screening  mammogram in one year. (Code:[id number]-B-01Y) BI-RADS CATEGORY  1: Negative. Electronically Signed   By: Baird Lyons M.D.   On: 02/15/2023 12:28    Microbiology: Results for orders placed or performed during the hospital encounter of 01/28/23  Culture, blood (x 2)     Status: None   Collection Time: 01/29/23  4:38 AM   Specimen: BLOOD RIGHT ARM  Result Value Ref Range Status   Specimen Description BLOOD RIGHT ARM  Final   Special Requests   Final    BOTTLES DRAWN AEROBIC AND ANAEROBIC Blood Culture adequate volume   Culture   Final    NO GROWTH 5 DAYS Performed at [facility], [address], [place], [place] [address]    Report Status 02/03/2023 FINAL  Final  Culture, blood (x 2)     Status: None   Collection Time: 01/29/23  4:38 AM   Specimen: BLOOD RIGHT ARM  Result Value Ref Range Status   Specimen Description BLOOD RIGHT ARM  Final   Special Requests   Final    BOTTLES DRAWN AEROBIC AND ANAEROBIC Blood Culture adequate volume   Culture   Final    NO GROWTH 5 DAYS Performed at [facility], [address], [place], [place] [address]    Report Status 02/03/2023 FINAL  Final  SARS Coronavirus 2 by RT PCR (hospital order, performed in [facility] hospital lab) *cepheid single result test* Anterior Nasal Swab     Status: None   Collection Time: 01/29/23 10:39 AM   Specimen: Anterior Nasal Swab  Result Value Ref Range Status   SARS Coronavirus 2 by RT PCR NEGATIVE NEGATIVE Final    Comment: (NOTE) SARS-CoV-2 target nucleic acids are NOT DETECTED.  The SARS-CoV-2 RNA is generally detectable in upper and lower respiratory specimens during the acute phase of infection. The lowest concentration of SARS-CoV-2 viral copies this assay can detect is 250 copies / mL. A negative result does not preclude SARS-CoV-2 infection and should not be used as the sole basis for treatment or other patient management decisions.  A negative result may occur with improper specimen  collection / handling, submission of specimen other than nasopharyngeal swab, presence of viral mutation(s) within the areas targeted by this assay, and inadequate number of viral copies (<250 copies / mL). A negative result must be combined with clinical observations, patient history, and epidemiological information.  Fact Sheet for Patients:   RoadLapTop.co.za  Fact Sheet for Healthcare Providers: http://kim-miller.com/  This test is not yet approved or  cleared by the [place] FDA and has been authorized for detection and/or diagnosis of SARS-CoV-2 by FDA under an Emergency Use Authorization (EUA).  This EUA will remain in effect (meaning this  test can be used) for the duration of the COVID-19 declaration under Section 564(b)(1) of the Act, 21 U.S.C. section 360bbb-3(b)(1), unless the authorization is terminated or revoked sooner.  Performed at [facility], [address] [place], [place] [address]     Labs: CBC: Recent Labs  Lab 03/02/23 1455 03/02/23 2042 03/03/23 0317 03/03/23 1125 03/04/23 0248  WBC 1.6* 1.6* 1.4*  --  1.4*  NEUTROABS 0.8* 0.7* 0.6*  --   --   HGB 9.6* 9.8* 8.7* 8.9* 8.5*  HCT 29.0* 29.6* 26.5* 27.1* 25.5*  MCV 96.3 96.4 96.0  --  95.1  PLT 54* 55* 49*  --  53*   Basic Metabolic Panel: Recent Labs  Lab 03/02/23 1455 03/03/23 0317 03/04/23 0248  NA 137 138 139  K 3.4* 3.0* 3.4*  CL 106 105 109  CO2 25 24 25   GLUCOSE 136* 119* 92  BUN 13 13 13   CREATININE 0.73 0.83 0.73  CALCIUM 8.6* 8.2* 8.0*  MG  --  2.2  --    Liver Function Tests: Recent Labs  Lab 03/02/23 1455 03/03/23 0317  AST 35 33  ALT 20 19  ALKPHOS 42 41  BILITOT 1.0 1.0  PROT 7.5 6.8  ALBUMIN 3.3* 3.0*   CBG: Recent Labs  Lab 03/03/23 0723 03/03/23 1130 03/03/23 1653 03/04/23 0745 03/04/23 1119  GLUCAP 100* 115* 74 97 121*    Discharge time spent: greater than 30 minutes.  Signed: Lucile Shutters, MD [facility] 03/04/2023

## 2023-03-04 NOTE — ED Notes (Signed)
Pt verbalizes understanding of discharge instructions. Opportunity for questioning and answers were provided. Pt discharged from ED to home with daughter.    

## 2023-03-04 NOTE — ED Notes (Signed)
MD called to speak with patient

## 2023-03-04 NOTE — Progress Notes (Signed)
Midge Minium, MD [facility]   [address], [address] [place], [place] [address] Phone: [phone] Fax : [phone]   Subjective: The patient continues to have abdominal pain.  This is new for her.  The patient had rectal bleeding prior to admission but has not had rectal bleeding since being here.  The patient has a stable hemoglobin.  No report of any nausea or vomiting.  Objective: Vital signs in last 24 hours: Vitals:   03/04/23 0440 03/04/23 0800 03/04/23 0835 03/04/23 0945  BP: (!) 120/51 (!) 111/57  132/62  Pulse: 66 81  79  Resp: 15 15  18   Temp: 97.9 F (36.6 C)  97.7 F (36.5 C)   TempSrc: Oral  Oral   SpO2: 96% 95%  100%  Weight:      Height:       Weight change:   Intake/Output Summary (Last 24 hours) at 03/04/2023 1018 Last data filed at 03/03/2023 1749 Gross per 24 hour  Intake 236.5 ml  Output --  Net 236.5 ml     Exam: Heart:: Regular rate rhythm Lungs: normal and clear to auscultation and percussion Abdomen: Diffuse mild abdominal pain throughout  Lab Results: @LABTEST2 @ Micro Results: No results found for this or any previous visit (from the past 240 hours). Studies/Results: CT ANGIO GI BLEED Result Date: 03/03/2023 CLINICAL DATA:  abdominal pain EXAM: CTA ABDOMEN AND PELVIS WITHOUT AND WITH CONTRAST TECHNIQUE: Multidetector CT imaging of the abdomen and pelvis was performed using the standard protocol during bolus administration of intravenous contrast. Multiplanar reconstructed images and MIPs were obtained and reviewed to evaluate the vascular anatomy. RADIATION DOSE REDUCTION: This exam was performed according to the departmental dose-optimization program which includes automated exposure control, adjustment of the mA and/or kV according to patient size and/or use of iterative reconstruction technique. CONTRAST:  OMNIPAQUE IOHEXOL 350 MG/ML SOLN COMPARISON:  09/11/2022 FINDINGS: VASCULAR Aorta: Normal caliber. No aneurysm or dissection.  Moderate atherosclerotic calcification. No periaortic inflammatory stranding or fluid collections are identified. Celiac: Patent without evidence of aneurysm, dissection, vasculitis or significant stenosis. SMA: Patent without evidence of aneurysm, dissection, vasculitis or significant stenosis. 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 No aneurysm or dissection IMA: Patent without evidence of aneurysm, dissection, vasculitis or significant stenosis. Inflow: Patent without evidence of aneurysm, dissection, vasculitis or significant stenosis. Proximal Outflow: Bilateral common femoral and visualized portions of the superficial and profunda femoral arteries are patent without evidence of aneurysm, dissection, vasculitis or significant stenosis. Veins: Evidence of portal venous hypertension is identified with recanalization of the umbilical vein and numerous gastroesophageal varices identified. The portal vein is patent. The abdominal venous vasculature is otherwise unremarkable. Review of the MIP images confirms the above findings. NON-VASCULAR Lower chest: There is focal consolidation within the basilar right middle lobe as well as scattered interstitial pulmonary infiltrates within the visualized lower lobes bilaterally which may be infectious or inflammatory. Pacemaker leads are seen within the right heart. 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 No enhancing intrahepatic mass. No intra or extrahepatic biliary ductal dilation. Status post cholecystectomy. Pancreas: Unremarkable Spleen: Moderate splenomegaly with the spleen measuring 17.9 cm in greatest dimension appears stable since prior examination. No intrasplenic lesions are seen. Adrenals/Urinary Tract: The adrenal glands are  unremarkable. Kidneys are normal. Bladder is unremarkable. Stomach/Bowel: Mural  thickening without associated hyperemia involving the ascending colon likely reflects changes of portal colopathy though a superimposed infectious or inflammatory colitis could appear similarly. Stomach, small bowel, and large bowel are otherwise unremarkable save for mild sigmoid diverticulosis. The appendix is absent. Mild ascites. No free intraperitoneal gas. Lymphatic: There is shotty gastrohepatic, periceliac, periportal, and left periaortic and aortocaval adenopathy present with the index lymph node measuring 14 mm in short axis diameter within the periceliac lymph node groups, stable since prior examination. This is nonspecific. No new pathologic adenopathy within the abdomen and pelvis. Reproductive: Status post hysterectomy. No adnexal masses. Other: No abdominal wall hernia Musculoskeletal: The osseous structures are age-appropriate. No acute bone abnormality IMPRESSION: 1. No acute intra-abdominal pathology identified. No definite radiographic explanation for the patient's reported symptoms. 2. 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 3. 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 Moderate splenomegaly. Mild ascites. 4. 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 5. Focal consolidation within the basilar right middle lobe as well as scattered interstitial pulmonary infiltrates within the visualized lower lobes bilaterally which may be infectious or inflammatory. 6. 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 Aortic Atherosclerosis (ICD10-I70.0). Electronically Signed   By: Helyn Numbers M.D.   On:  03/03/2023 01:54   DG Chest Port 1 View Result Date: 03/02/2023 CLINICAL DATA:  Cough and rectal bleeding EXAM: PORTABLE CHEST 1 VIEW COMPARISON:  02/07/2023 FINDINGS: Stable cardiomediastinal silhouette. Aortic atherosclerotic calcification. Right chest wall pacemaker. Similar patchy airspace opacities in the right middle lobe and lingula. No pleural effusion or pneumothorax. IMPRESSION: Similar patchy airspace opacities in the right middle lobe and lingula. Electronically Signed   By: Minerva Fester M.D.   On: 03/02/2023 23:49   Medications: I have reviewed the patient's current medications. Scheduled Meds:  insulin aspart  0-15 Units Subcutaneous TID WC   pantoprazole (PROTONIX) IV  40 mg Intravenous Q12H   sodium chloride flush  3-10 mL Intravenous Q12H   Continuous Infusions:  cefTRIAXone (ROCEPHIN)  IV Stopped (03/03/23 2201)   octreotide (SANDOSTATIN) 500 mcg in sodium chloride 0.9 % 250 mL (2 mcg/mL) infusion 50 mcg/hr (03/04/23 0523)   potassium chloride 10 mEq (03/04/23 0922)   PRN Meds:.ondansetron **OR** ondansetron (ZOFRAN) IV, sodium chloride flush   Assessment: Principal Problem:   GI bleeding Active Problems:   Pancytopenia (HCC)   Hypothyroidism   HTN (hypertension)   Chronic heart failure with preserved ejection fraction (HFpEF) (HCC)   Pacemaker   Pneumonia   Type 2 diabetes mellitus without complications (HCC)   Non-alcoholic fatty liver disease   ABLA (acute blood loss anemia)   Abdominal pain   Electrolyte abnormality    Plan: This patient has had no further bleeding since admission.  With the patient's history of hemorrhoids and her bright red blood per rectum the differential diagnosis includes hemorrhoidal bleeding versus ischemic colitis.  The patient has had no further bleeding and had a colonoscopy and multiple upper endoscopies in the past.  This is unlikely an upper GI bleed as found by Dr. Mia Creek at the last EGD because the stools are bright red  blood.  The patient should have her diet advanced as tolerated and follow-up as an outpatient.  I will sign off.  Please call if any further GI  concerns or questions.  We would like to thank you for the opportunity to participate in the care of Anarosa Roulston.    LOS: 2 days   Midge Minium, MD.FACG 03/04/2023, 10:18 AM Pager [phone] 7am-5pm  Check AMION for 5pm -7am coverage and on weekends

## 2023-03-06 ENCOUNTER — Inpatient Hospital Stay: Payer: Medicare PPO | Attending: Internal Medicine

## 2023-03-06 ENCOUNTER — Inpatient Hospital Stay: Payer: Medicare PPO | Admitting: Internal Medicine

## 2023-03-06 ENCOUNTER — Encounter: Payer: Self-pay | Admitting: Internal Medicine

## 2023-03-06 ENCOUNTER — Inpatient Hospital Stay: Payer: Medicare PPO

## 2023-03-06 VITALS — BP 108/49 | HR 70

## 2023-03-06 VITALS — BP 125/51 | HR 67 | Temp 96.9°F | Ht 62.0 in | Wt 147.2 lb

## 2023-03-06 DIAGNOSIS — Z800 Family history of malignant neoplasm of digestive organs: Secondary | ICD-10-CM | POA: Diagnosis not present

## 2023-03-06 DIAGNOSIS — D61818 Other pancytopenia: Secondary | ICD-10-CM | POA: Diagnosis not present

## 2023-03-06 DIAGNOSIS — K7581 Nonalcoholic steatohepatitis (NASH): Secondary | ICD-10-CM | POA: Diagnosis not present

## 2023-03-06 DIAGNOSIS — I4891 Unspecified atrial fibrillation: Secondary | ICD-10-CM | POA: Diagnosis not present

## 2023-03-06 DIAGNOSIS — K7460 Unspecified cirrhosis of liver: Secondary | ICD-10-CM | POA: Insufficient documentation

## 2023-03-06 DIAGNOSIS — K766 Portal hypertension: Secondary | ICD-10-CM | POA: Diagnosis not present

## 2023-03-06 DIAGNOSIS — D500 Iron deficiency anemia secondary to blood loss (chronic): Secondary | ICD-10-CM | POA: Insufficient documentation

## 2023-03-06 DIAGNOSIS — E611 Iron deficiency: Secondary | ICD-10-CM

## 2023-03-06 DIAGNOSIS — Z803 Family history of malignant neoplasm of breast: Secondary | ICD-10-CM | POA: Diagnosis not present

## 2023-03-06 DIAGNOSIS — I495 Sick sinus syndrome: Secondary | ICD-10-CM | POA: Insufficient documentation

## 2023-03-06 DIAGNOSIS — R5383 Other fatigue: Secondary | ICD-10-CM | POA: Diagnosis not present

## 2023-03-06 DIAGNOSIS — Z86000 Personal history of in-situ neoplasm of breast: Secondary | ICD-10-CM | POA: Insufficient documentation

## 2023-03-06 DIAGNOSIS — D6959 Other secondary thrombocytopenia: Secondary | ICD-10-CM | POA: Diagnosis not present

## 2023-03-06 DIAGNOSIS — N920 Excessive and frequent menstruation with regular cycle: Secondary | ICD-10-CM | POA: Insufficient documentation

## 2023-03-06 DIAGNOSIS — R161 Splenomegaly, not elsewhere classified: Secondary | ICD-10-CM | POA: Diagnosis not present

## 2023-03-06 DIAGNOSIS — R140 Abdominal distension (gaseous): Secondary | ICD-10-CM | POA: Diagnosis not present

## 2023-03-06 LAB — CBC WITH DIFFERENTIAL (CANCER CENTER ONLY)
Abs Immature Granulocytes: 0 10*3/uL (ref 0.00–0.07)
Basophils Absolute: 0 10*3/uL (ref 0.0–0.1)
Basophils Relative: 1 %
Eosinophils Absolute: 0.1 10*3/uL (ref 0.0–0.5)
Eosinophils Relative: 5 %
HCT: 30.9 % — ABNORMAL LOW (ref 36.0–46.0)
Hemoglobin: 10.2 g/dL — ABNORMAL LOW (ref 12.0–15.0)
Immature Granulocytes: 0 %
Lymphocytes Relative: 22 %
Lymphs Abs: 0.4 10*3/uL — ABNORMAL LOW (ref 0.7–4.0)
MCH: 31.3 pg (ref 26.0–34.0)
MCHC: 33 g/dL (ref 30.0–36.0)
MCV: 94.8 fL (ref 80.0–100.0)
Monocytes Absolute: 0.1 10*3/uL (ref 0.1–1.0)
Monocytes Relative: 8 %
Neutro Abs: 1 10*3/uL — ABNORMAL LOW (ref 1.7–7.7)
Neutrophils Relative %: 64 %
Platelet Count: 57 10*3/uL — ABNORMAL LOW (ref 150–400)
RBC: 3.26 MIL/uL — ABNORMAL LOW (ref 3.87–5.11)
RDW: 15.4 % (ref 11.5–15.5)
WBC Count: 1.6 10*3/uL — ABNORMAL LOW (ref 4.0–10.5)
nRBC: 0 % (ref 0.0–0.2)

## 2023-03-06 LAB — IRON AND TIBC
Iron: 49 ug/dL (ref 28–170)
Saturation Ratios: 15 % (ref 10.4–31.8)
TIBC: 321 ug/dL (ref 250–450)
UIBC: 272 ug/dL

## 2023-03-06 LAB — CMP (CANCER CENTER ONLY)
ALT: 19 U/L (ref 0–44)
AST: 35 U/L (ref 15–41)
Albumin: 3.5 g/dL (ref 3.5–5.0)
Alkaline Phosphatase: 62 U/L (ref 38–126)
Anion gap: 7 (ref 5–15)
BUN: 15 mg/dL (ref 8–23)
CO2: 27 mmol/L (ref 22–32)
Calcium: 8.6 mg/dL — ABNORMAL LOW (ref 8.9–10.3)
Chloride: 105 mmol/L (ref 98–111)
Creatinine: 0.69 mg/dL (ref 0.44–1.00)
GFR, Estimated: >60 mL/min (ref >60)
Glucose, Bld: 151 mg/dL — ABNORMAL HIGH (ref 70–99)
Potassium: 3.5 mmol/L (ref 3.5–5.1)
Sodium: 139 mmol/L (ref 135–145)
Total Bilirubin: 0.6 mg/dL (ref 0.0–1.2)
Total Protein: 7.4 g/dL (ref 6.5–8.1)

## 2023-03-06 LAB — FERRITIN: Ferritin: 131 ng/mL (ref 11–307)

## 2023-03-06 MED ORDER — SODIUM CHLORIDE 0.9% FLUSH
10.0000 mL | Freq: Once | INTRAVENOUS | Status: AC | PRN
  Administered 2023-03-06: 10 mL
  Filled 2023-03-06: qty 10

## 2023-03-06 MED ORDER — IRON SUCROSE 20 MG/ML IV SOLN
200.0000 mg | Freq: Once | INTRAVENOUS | Status: AC
  Administered 2023-03-06: 200 mg via INTRAVENOUS
  Filled 2023-03-06: qty 10

## 2023-03-06 NOTE — Progress Notes (Signed)
[facility] OFFICE PROGRESS NOTE  Patient Care Team: Jaclyn Shaggy, MD as PCP - General (Internal Medicine) End, Cristal Deer, MD as PCP - Cardiology (Cardiology) Lanier Prude, MD as PCP - Electrophysiology (Cardiology) Earna Coder, MD as Consulting Physician (Internal Medicine)   Cancer Staging  No matching staging information was found for the patient.    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 OCT 2018- BMBx- MILD dyspoiesis; variable cellularity [10-50%]; FISH/Cytogenetics-NORMAL; F-One-NGS-declined by insurance.    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   #Frequent UTIs [2019-2020; Dr. Brandon/uro-gyne,[facility]]; Dr.Wohl/  # HRT [clinical trial thru [facility]; stopped July 2017 ]; CPAP   DIAGNOSIS: Left breast DCIS  STAGE:    0     ;GOALS: cure  CURRENT/MOST RECENT THERAPY: surveillaince    Ductal carcinoma in situ (DCIS) of left breast    INTERVAL HISTORY: Alone.  Ambulating independently.  Annette Foster 83 y.o.  female pleasant patient above history of DCIS; cirrhosis portal hypertension; A. fib and pancytopenia is here for follow-up.   Patient was admitted to hospital December and again in January for upper GI bleed.  Endoscopy was found to have esophageal varices and duodenal angiodysplastic lesion that patient underwent treatment with argon plasma coagulation , with repeat plan for endoscopy and banding in the following few weeks   Patient states her hands  and feet are exteremly cold now. No bleeding.  Complains of ongoing fatigue.  Positive for abdominal distention.  Denies any swelling in the legs.  Denies any shortness of breath or cough.    Review of Systems  Constitutional:  Positive for malaise/fatigue. Negative for chills, diaphoresis, fever and weight loss.  HENT:  Negative for nosebleeds and sore throat.   Eyes:  Negative for double vision.  Respiratory:  Negative for cough, hemoptysis, sputum production, shortness of breath and wheezing.   Cardiovascular:  Negative for chest pain, palpitations, orthopnea and leg swelling.  Gastrointestinal:  Negative for abdominal pain, blood in stool, constipation, diarrhea, heartburn, melena, nausea and vomiting.  Musculoskeletal:  Positive for back pain and joint pain.  Skin: Negative.  Negative for itching and rash.  Neurological:  Negative for dizziness, tingling, focal weakness, weakness and headaches.  Endo/Heme/Allergies:  Does not bruise/bleed easily.  Psychiatric/Behavioral:  Negative for depression. The patient is not nervous/anxious and does not have insomnia.       PAST MEDICAL HISTORY :  Past Medical History:  Diagnosis Date   Abdominal pain    Allergy    Cancer (HCC) 2017   breast cancer- Left   Cataract    CHF (congestive heart failure) (HCC)    Collagenous colitis    Coronary artery disease    Diabetes mellitus without complication (HCC)    Diarrhea    Diverticulosis    Dysrhythmia    Fatty liver disease, nonalcoholic    Fibrocystic breast    GERD (gastroesophageal reflux disease)    Heart murmur    Hyperlipidemia    Hypertension    Hypothyroidism    IBS (irritable bowel syndrome)    IDA (iron deficiency  anemia)    Liver cirrhosis (HCC)    Personal history of radiation therapy 2017   LEFT BREAST CA   PONV (postoperative nausea and vomiting)    Presence of permanent cardiac pacemaker    Sleep apnea    C-Pap    PAST SURGICAL HISTORY :   Past Surgical  History:  Procedure Laterality Date   ABDOMINAL HYSTERECTOMY     tah bso   ABDOMINAL SURGERY     APPENDECTOMY     AV NODE ABLATION N/A 05/17/2021   Procedure: AV NODE ABLATION;  Surgeon: Lanier Prude, MD;  [facility]: [facility];  Service: Cardiovascular;  Laterality: N/A;   BREAST BIOPSY Bilateral 2016   negative   BREAST 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/2015   DCIS, papillary carcinoma in situ   BREAST BIOPSY Left 05/27/2016   BENIGN MAMMARY EPITHELIUM   BREAST BIOPSY Left 11/20/2017   affirm bx x clip BENIGN MAMMARY EPITHELIUM CONSISTENT WITH RAD THERAPY   BREAST EXCISIONAL BIOPSY Right    NEG 1980's   BREAST LUMPECTOMY Left 10/17/2015   DCIS and papillary carcinoma insitu, clear margins   CARDIAC SURGERY     has replacement valve   CATARACT EXTRACTION Right    CATARACT EXTRACTION W/PHACO Left 01/16/2021   Procedure: CATARACT EXTRACTION PHACO AND INTRAOCULAR LENS PLACEMENT (IOC) LEFT DIABETIC 8.46 00:59.8;  Surgeon: Galen Manila, MD;  Location: [facility];  Service: Ophthalmology;  Laterality: Left;   CHOLECYSTECTOMY     COLONOSCOPY WITH PROPOFOL N/A 03/11/2016   Procedure: COLONOSCOPY WITH PROPOFOL;  Surgeon: Scot Jun, MD;  Location: [facility] ENDOSCOPY;  Service: Endoscopy;  Laterality: N/A;   COLONOSCOPY WITH PROPOFOL N/A 02/18/2020   Procedure: COLONOSCOPY WITH PROPOFOL;  Surgeon: Midge Minium, MD;  Location: [facility] ENDOSCOPY;  Service: Endoscopy;  Laterality: N/A;   COLONOSCOPY WITH PROPOFOL N/A 09/10/2022   Procedure: COLONOSCOPY WITH PROPOFOL;  Surgeon: Wyline Mood, MD;  Location: [facility] ENDOSCOPY;  Service: Gastroenterology;  Laterality: N/A;   ESOPHAGEAL BANDING  09/10/2022   Procedure: ESOPHAGEAL BANDING;  Surgeon: Wyline Mood, MD;  Location: [facility] ENDOSCOPY;  Service: Gastroenterology;;   ESOPHAGEAL BANDING  10/17/2022   Procedure: ESOPHAGEAL BANDING;  Surgeon: Midge Minium, MD;  Location: [facility] ENDOSCOPY;  Service: Endoscopy;;   ESOPHAGEAL BANDING  12/24/2022    Procedure: ESOPHAGEAL BANDING;  Surgeon: Midge Minium, MD;  Location: [facility] ENDOSCOPY;  Service: Endoscopy;;   ESOPHAGOGASTRODUODENOSCOPY (EGD) WITH PROPOFOL N/A 03/11/2016   Procedure: ESOPHAGOGASTRODUODENOSCOPY (EGD) WITH PROPOFOL;  Surgeon: Scot Jun, MD;  Location: [facility] ENDOSCOPY;  Service: Endoscopy;  Laterality: N/A;   ESOPHAGOGASTRODUODENOSCOPY (EGD) WITH PROPOFOL N/A 02/18/2020   Procedure: ESOPHAGOGASTRODUODENOSCOPY (EGD) WITH PROPOFOL;  Surgeon: Midge Minium, MD;  Location: [facility] ENDOSCOPY;  Service: Endoscopy;  Laterality: N/A;   ESOPHAGOGASTRODUODENOSCOPY (EGD) WITH PROPOFOL N/A 04/25/2020   Procedure: ESOPHAGOGASTRODUODENOSCOPY (EGD) WITH PROPOFOL;  Surgeon: Midge Minium, MD;  Location: [facility] ENDOSCOPY;  Service: Endoscopy;  Laterality: N/A;   ESOPHAGOGASTRODUODENOSCOPY (EGD) WITH PROPOFOL N/A 05/23/2020   Procedure: ESOPHAGOGASTRODUODENOSCOPY (EGD) WITH PROPOFOL;  Surgeon: Midge Minium, MD;  Location: [facility] ENDOSCOPY;  Service: Endoscopy;  Laterality: N/A;   ESOPHAGOGASTRODUODENOSCOPY (EGD) WITH PROPOFOL N/A 09/10/2022   Procedure: ESOPHAGOGASTRODUODENOSCOPY (EGD) WITH PROPOFOL;  Surgeon: Wyline Mood, MD;  Location: [facility] ENDOSCOPY;  Service: Gastroenterology;  Laterality: N/A;   ESOPHAGOGASTRODUODENOSCOPY (EGD) WITH PROPOFOL N/A 10/17/2022   Procedure: ESOPHAGOGASTRODUODENOSCOPY (EGD) WITH PROPOFOL;  Surgeon: Midge Minium, MD;  Location: [facility] ENDOSCOPY;  Service: Endoscopy;  Laterality: N/A;   ESOPHAGOGASTRODUODENOSCOPY (EGD) WITH PROPOFOL N/A 12/24/2022   Procedure: ESOPHAGOGASTRODUODENOSCOPY (  EGD) WITH PROPOFOL;  Surgeon: Midge Minium, MD;  Location: [facility] ENDOSCOPY;  Service: Endoscopy;  Laterality: N/A;   ESOPHAGOGASTRODUODENOSCOPY (EGD) WITH PROPOFOL N/A 01/29/2023   Procedure: ESOPHAGOGASTRODUODENOSCOPY (EGD) WITH PROPOFOL;  Surgeon: Regis Bill, MD;  Location: [facility] ENDOSCOPY;  Service: Endoscopy;  Laterality: N/A;  Ideally we would intubate if possible given concern for  varices   EUS N/A 10/04/2021   Procedure: LOWER ENDOSCOPIC ULTRASOUND (EUS);  Surgeon: Doren Custard, MD;  Location: [facility] ENDOSCOPY;  Service: Gastroenterology;  Laterality: N/A;  [facility]   EYE SURGERY     FINGER ARTHROSCOPY WITH CARPOMETACARPEL (CMC) ARTHROPLASTY Right 09/03/2018   Procedure: CARPOMETACARPEL [facility]) ARTHROPLASTY RIGHT THUMB;  Surgeon: Kennedy Bucker, MD;  Location: [facility] ORS;  Service: Orthopedics;  Laterality: Right;   FLEXIBLE SIGMOIDOSCOPY N/A 09/14/2021   Procedure: FLEXIBLE SIGMOIDOSCOPY;  Surgeon: Midge Minium, MD;  Location: [facility] ENDOSCOPY;  Service: Endoscopy;  Laterality: N/A;  No anesthesia   GANGLION CYST EXCISION Right 09/03/2018   Procedure: REMOVAL GANGLION OF WRIST;  Surgeon: Kennedy Bucker, MD;  Location: [facility] ORS;  Service: Orthopedics;  Laterality: Right;   HARDWARE REMOVAL Right 09/03/2018   Procedure: HARDWARE REMOVAL RIGHT THUMB;  Surgeon: Kennedy Bucker, MD;  Location: [facility] ORS;  Service: Orthopedics;  Laterality: Right;  staple removed   HEMOSTASIS CLIP PLACEMENT  09/10/2022   Procedure: HEMOSTASIS CLIP PLACEMENT;  Surgeon: Wyline Mood, MD;  Location: [facility] ENDOSCOPY;  Service: Gastroenterology;;   HEMOSTASIS CONTROL  09/10/2022   Procedure: HEMOSTASIS CONTROL;  Surgeon: Wyline Mood, MD;  Location: [facility] ENDOSCOPY;  Service: Gastroenterology;;   HEMOSTASIS CONTROL  01/29/2023   Procedure: HEMOSTASIS CONTROL;  Surgeon: Regis Bill, MD;  Location: [facility] ENDOSCOPY;  Service: Endoscopy;;   JOINT REPLACEMENT Left    TKR   left sinusplasty      MASTECTOMY, PARTIAL Left 10/17/2015   Procedure: MASTECTOMY PARTIAL REVISION;  Surgeon: Nadeen Landau, MD;  Location: [facility] ORS;  Service: General;  Laterality: Left;   PACEMAKER IMPLANT N/A 03/23/2021   Procedure: PACEMAKER IMPLANT;  Surgeon: Lanier Prude, MD;  Location: [facility];  Service: Cardiovascular;  Laterality: N/A;   PARTIAL MASTECTOMY WITH NEEDLE LOCALIZATION Left 09/29/2015    Procedure: PARTIAL MASTECTOMY WITH NEEDLE LOCALIZATION;  Surgeon: Nadeen Landau, MD;  Location: [facility] ORS;  Service: General;  Laterality: Left;   POLYPECTOMY  09/10/2022   Procedure: POLYPECTOMY;  Surgeon: Wyline Mood, MD;  Location: [facility] ENDOSCOPY;  Service: Gastroenterology;;   RECTAL EXAM UNDER ANESTHESIA N/A 10/05/2021   Procedure: RECTAL EXAM UNDER ANESTHESIA, ASPIRATION OF RECTAL CYST;  Surgeon: Carolan Shiver, MD;  Location: [facility] ORS;  Service: General;  Laterality: N/A;   TOTAL ABDOMINAL HYSTERECTOMY W/ BILATERAL SALPINGOOPHORECTOMY      FAMILY HISTORY :   Family History  Problem Relation Age of Onset   Breast cancer Paternal Grandmother    Colon cancer Father    Diabetes Sister    Diabetes Brother    Heart disease Brother    Prostate cancer Brother    Colon cancer Maternal Uncle    Prostate cancer Brother    Bladder Cancer Brother    Leukemia Mother        all   Ovarian cancer Neg Hx    Kidney cancer Neg Hx     SOCIAL HISTORY:   Social History   Tobacco Use   Smoking status: Never   Smokeless tobacco: Never  Vaping Use   Vaping status: Never Used  Substance Use Topics   Alcohol use:  Never   Drug use: Never    ALLERGIES:  is allergic to codeine, demeclocycline, demerol [meperidine], hydrocodone, morphine, other, oxycodone, pentazocine, tetracyclines & related, coal tar extract, hydrocodone-acetaminophen, salicylic acid, and tetracycline hcl.  MEDICATIONS:  Current Outpatient Medications  Medication Sig Dispense Refill   Azelastine HCl 137 MCG/SPRAY SOLN INHALE 1-2 PUFFS IN EACH NOSTRIL NASALLY ONCE A DAY 30 DAY(S)     benzonatate (TESSALON) 100 MG capsule Take 100 mg by mouth 3 (three) times daily as needed.     Cholecalciferol (VITAMIN D) 50 MCG (2000 UT) CAPS Take 2,000 Units by mouth daily.     clobetasol ointment (TEMOVATE) 0.05 % Apply 1 application topically as needed (vaginal irritation).     clotrimazole (LOTRIMIN) 1 % cream Apply 1  Application topically 2 (two) times daily as needed.     cyanocobalamin (,VITAMIN B-12,) 1000 MCG/ML injection 1,000 mcg every 30 (thirty) days.     dicyclomine (BENTYL) 10 MG capsule Take 10 mg by mouth in the morning, at noon, and at bedtime.     docusate sodium (COLACE) 100 MG capsule Take 100 mg by mouth 3 (three) times daily as needed for moderate constipation.     esomeprazole (NEXIUM) 40 MG capsule Take 40 mg by mouth 2 (two) times daily before a meal.      fenofibrate 160 MG tablet Take 160 mg by mouth at bedtime.     gabapentin (NEURONTIN) 300 MG capsule Take 300 mg by mouth 2 (two) times daily.     Insulin Aspart (NOVOLOG FLEXPEN Ursa) Inject into the skin. Sliding scale     insulin degludec (TRESIBA) 200 UNIT/ML FlexTouch Pen Inject 194 Units into the skin at bedtime. 10 mL 1   levocetirizine (XYZAL) 5 MG tablet Take 5 mg by mouth every evening.     levothyroxine (SYNTHROID, LEVOTHROID) 75 MCG tablet Take 75 mcg by mouth daily before breakfast.      magnesium oxide (MAG-OX) 400 MG tablet Take 400 mg by mouth 2 (two) times daily.      nadolol (CORGARD) 20 MG tablet Take 1 tablet (20 mg total) by mouth daily. 90 tablet 3   NONFORMULARY OR COMPOUNDED ITEM Nifedipine 0.3% plus lidocaine 2% cream apply to rectum BID and after every BM.     potassium chloride SA (K-DUR,KLOR-CON) 20 MEQ tablet Take 20 mEq by mouth 2 (two) times daily.      saccharomyces boulardii (FLORASTOR) 250 MG capsule Take 250 mg by mouth 2 (two) times daily.      simvastatin (ZOCOR) 20 MG tablet Take 20 mg by mouth at bedtime.     spironolactone (ALDACTONE) 25 MG tablet TAKE 1/2 TABLET BY MOUTH DAILY 45 tablet 3   tirzepatide (MOUNJARO) 15 MG/0.5ML Pen Inject 15 mg into the skin once a week. 2 mL 0   torsemide (DEMADEX) 20 MG tablet Take 20 mg by mouth daily.     valACYclovir (VALTREX) 500 MG tablet Take 500 mg by mouth 2 (two) times daily as needed.     No current facility-administered medications for this visit.    Facility-Administered Medications Ordered in Other Visits  Medication Dose Route Frequency Provider Last Rate Last Admin   iron sucrose (VENOFER) injection 200 mg  200 mg Intravenous Once Louretta Shorten R, MD       sodium chloride flush (NS) 0.9 % injection 10 mL  10 mL Intracatheter Once PRN Earna Coder, MD        PHYSICAL EXAMINATION: ECOG PERFORMANCE STATUS: 1 -  Symptomatic but completely ambulatory  BP (!) 125/51 (BP Location: Right Arm, Patient Position: Sitting, Cuff Size: Normal)   Pulse 67   Temp (!) 96.9 F (36.1 C) (Tympanic)   Ht 5\' 2"  (1.575 m)   Wt 147 lb 3.2 oz (66.8 kg)   LMP  (LMP Unknown)   SpO2 100%   BMI 26.92 kg/m   Filed Weights   03/06/23 1021  Weight: 147 lb 3.2 oz (66.8 kg)    Physical Exam Constitutional:      Comments: Alone.  Ambulating independently.  HENT:     Head: Normocephalic and atraumatic.     Mouth/Throat:     Pharynx: No oropharyngeal exudate.  Eyes:     Pupils: Pupils are equal, round, and reactive to light.  Cardiovascular:     Rate and Rhythm: Tachycardia present. Rhythm irregular.     Heart sounds: Murmur heard.  Pulmonary:     Effort: Pulmonary effort is normal. No respiratory distress.     Breath sounds: Normal breath sounds. No wheezing.  Abdominal:     General: Bowel sounds are normal. There is no distension.     Palpations: Abdomen is soft. There is no mass.     Tenderness: There is no abdominal tenderness. There is no guarding or rebound.     Comments: Positive for splenomegaly.  Musculoskeletal:        General: No tenderness. Normal range of motion.     Cervical back: Normal range of motion and neck supple.  Skin:    General: Skin is warm.  Neurological:     Mental Status: She is alert and oriented to person, place, and time.  Psychiatric:        Mood and Affect: Affect normal.    Positive for abdominal distension  LABORATORY DATA:  I have reviewed the data as listed    Component Value  Date/Time   NA 139 03/06/2023 1014   NA 138 10/03/2022 1509   K 3.5 03/06/2023 1014   CL 105 03/06/2023 1014   CO2 27 03/06/2023 1014   GLUCOSE 151 (H) 03/06/2023 1014   BUN 15 03/06/2023 1014   BUN 15 10/03/2022 1509   CREATININE 0.69 03/06/2023 1014   CALCIUM 8.6 (L) 03/06/2023 1014   PROT 7.4 03/06/2023 1014   PROT 7.1 11/30/2020 0944   ALBUMIN 3.5 03/06/2023 1014   ALBUMIN 3.8 11/30/2020 0944   AST 35 03/06/2023 1014   ALT 19 03/06/2023 1014   ALKPHOS 62 03/06/2023 1014   BILITOT 0.6 03/06/2023 1014   GFRNONAA >60 03/06/2023 1014   GFRAA 80 02/25/2020 1105    No results found for: "SPEP", "UPEP"  Lab Results  Component Value Date   WBC 1.6 (L) 03/06/2023   NEUTROABS 1.0 (L) 03/06/2023   HGB 10.2 (L) 03/06/2023   HCT 30.9 (L) 03/06/2023   MCV 94.8 03/06/2023   PLT 57 (L) 03/06/2023      Chemistry      Component Value Date/Time   NA 139 03/06/2023 1014   NA 138 10/03/2022 1509   K 3.5 03/06/2023 1014   CL 105 03/06/2023 1014   CO2 27 03/06/2023 1014   BUN 15 03/06/2023 1014   BUN 15 10/03/2022 1509   CREATININE 0.69 03/06/2023 1014      Component Value Date/Time   CALCIUM 8.6 (L) 03/06/2023 1014   ALKPHOS 62 03/06/2023 1014   AST 35 03/06/2023 1014   ALT 19 03/06/2023 1014   BILITOT 0.6 03/06/2023 1014  RADIOGRAPHIC STUDIES: I have personally reviewed the radiological images as listed and agreed with the findings in the report. No results found.   ASSESSMENT & PLAN:  Pancytopenia (HCC) # Anemia-secondary GI bleed chronic/liver disease.  Hemoglobin-10- [Jan 2025- UGIB][Recent Upper GIB]; cannot tolerate PO iron- proceed with venofer.   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  ANC-1000;  Platelets 50-60s  stable.    # Tachy-brady syndrome- Hx of A.fib [Dr.End; no wtachnam device]- no anticoagulation.  stable.    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 BIL Mammogram JAN 2023 [Dr.Cintron]-within normal limits.  Not on AI.  Stable.   # DISPOSITION:  # proceed with Venofer today # follow up in 3 month- MD; labs- cbc/cmp;iron studies;ferritin; AFP- possible Venofer-Dr.B    Orders Placed This Encounter  Procedures   CBC with Differential (Cancer Center Only)    Standing Status:   Future    Expected Date:   06/04/2023    Expiration Date:   03/05/2024   CMP (Cancer Center only)    Standing Status:   Future    Expected Date:   06/04/2023    Expiration Date:   03/05/2024   Iron and TIBC    Standing Status:   Future    Expected Date:   06/04/2023    Expiration Date:   03/05/2024   Ferritin    Standing Status:   Future    Expected Date:   06/04/2023    Expiration Date:   03/05/2024   AFP tumor marker    Standing Status:   Future    Expected Date:   06/04/2023    Expiration Date:   03/05/2024   All questions were answered. The patient knows to call the clinic with any problems, questions or concerns.      Earna Coder, MD 03/06/2023 11:11 AM

## 2023-03-06 NOTE — Assessment & Plan Note (Addendum)
#   Anemia-secondary GI bleed chronic/liver disease.  Hemoglobin-10- [Jan 2025- UGIB][Recent Upper GIB]; cannot tolerate PO iron- proceed with venofer.   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  ANC-1000;  Platelets 50-60s  stable.    # Tachy-brady syndrome- Hx of A.fib [Dr.End; no wtachnam device]- no anticoagulation.  stable.    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 BIL Mammogram JAN 2023 [Dr.Cintron]-within normal limits.  Not on AI.  Stable.   # DISPOSITION:  # proceed with Venofer today # follow up in 3 month- MD; labs- cbc/cmp;iron studies;ferritin; AFP- possible Venofer-Dr.B

## 2023-03-06 NOTE — Progress Notes (Signed)
Fatigue/Weakness: yes Abnormal bleeding, bruising: yes Bone pain: no Headache: no Dizziness: no  01/02/2023 endoscopy. Was seen in ED 03/04/23 Rectal bleeding.  C/o swelling in legs x2-3 weeks, saw Dr. Okey Dupre cardiology.

## 2023-03-07 LAB — AFP TUMOR MARKER: AFP, Serum, Tumor Marker: 3 ng/mL (ref 0.0–8.7)

## 2023-03-17 ENCOUNTER — Telehealth: Payer: Self-pay

## 2023-03-17 NOTE — Telephone Encounter (Signed)
No messages received for at least 21 days Last message received 23 days ago. The patient will be deactivated in 67 days. 

## 2023-03-17 NOTE — Telephone Encounter (Signed)
 Attempted to contact patient. No answer,LMTCB

## 2023-03-18 NOTE — Telephone Encounter (Signed)
Pt called back her monitor had gotten unplugged and she didn't know how. Pt has plugged her device back in

## 2023-03-20 ENCOUNTER — Ambulatory Visit: Payer: Medicare PPO | Admitting: Medical

## 2023-03-20 ENCOUNTER — Encounter: Payer: Self-pay | Admitting: Internal Medicine

## 2023-03-20 NOTE — Telephone Encounter (Signed)
 Monitor updated 03/19/2023.

## 2023-03-21 ENCOUNTER — Ambulatory Visit (INDEPENDENT_AMBULATORY_CARE_PROVIDER_SITE_OTHER): Payer: Medicare PPO

## 2023-03-21 DIAGNOSIS — I495 Sick sinus syndrome: Secondary | ICD-10-CM

## 2023-03-25 LAB — CUP PACEART REMOTE DEVICE CHECK
Date Time Interrogation Session: 20250211084749
Implantable Lead Connection Status: 753985
Implantable Lead Connection Status: 753985
Implantable Lead Implant Date: 20230210
Implantable Lead Implant Date: 20230210
Implantable Lead Location: 753859
Implantable Lead Location: 753860
Implantable Lead Model: 377169
Implantable Lead Model: 377171
Implantable Lead Serial Number: 7000400966
Implantable Lead Serial Number: 7000402075
Implantable Pulse Generator Implant Date: 20230210
Pulse Gen Model: 407145
Pulse Gen Serial Number: 70331369

## 2023-03-27 ENCOUNTER — Encounter: Payer: Self-pay | Admitting: Otolaryngology

## 2023-03-29 ENCOUNTER — Encounter: Payer: Self-pay | Admitting: Cardiology

## 2023-03-31 ENCOUNTER — Ambulatory Visit (INDEPENDENT_AMBULATORY_CARE_PROVIDER_SITE_OTHER): Payer: Medicare PPO | Admitting: Podiatry

## 2023-03-31 DIAGNOSIS — Z91199 Patient's noncompliance with other medical treatment and regimen due to unspecified reason: Secondary | ICD-10-CM

## 2023-04-01 ENCOUNTER — Ambulatory Visit
Admission: RE | Admit: 2023-04-01 | Discharge: 2023-04-01 | Disposition: A | Discharge status: Home / Self Care | Payer: Medicare PPO | Source: Ambulatory Visit | Attending: Otolaryngology | Admitting: Otolaryngology

## 2023-04-01 ENCOUNTER — Encounter: Payer: Self-pay | Admitting: Internal Medicine

## 2023-04-01 ENCOUNTER — Other Ambulatory Visit: Payer: Self-pay | Admitting: Otolaryngology

## 2023-04-01 DIAGNOSIS — R059 Cough, unspecified: Secondary | ICD-10-CM

## 2023-04-02 ENCOUNTER — Other Ambulatory Visit: Payer: Self-pay

## 2023-04-07 NOTE — Progress Notes (Signed)
 1. No-show for appointment

## 2023-04-17 ENCOUNTER — Other Ambulatory Visit: Payer: Self-pay | Admitting: Emergency Medicine

## 2023-04-17 DIAGNOSIS — R0602 Shortness of breath: Secondary | ICD-10-CM

## 2023-04-21 ENCOUNTER — Encounter: Payer: Self-pay | Admitting: Internal Medicine

## 2023-04-22 ENCOUNTER — Ambulatory Visit
Admission: RE | Admit: 2023-04-22 | Discharge: 2023-04-22 | Disposition: A | Discharge status: Home / Self Care | Source: Ambulatory Visit | Attending: Emergency Medicine | Admitting: Emergency Medicine

## 2023-04-22 DIAGNOSIS — R0602 Shortness of breath: Secondary | ICD-10-CM | POA: Diagnosis present

## 2023-04-22 NOTE — Progress Notes (Signed)
 Remote pacemaker transmission.

## 2023-04-22 NOTE — Addendum Note (Signed)
 Addended by: Elease Etienne A on: 04/22/2023 01:32 PM   Modules accepted: Orders

## 2023-04-25 ENCOUNTER — Ambulatory Visit (INDEPENDENT_AMBULATORY_CARE_PROVIDER_SITE_OTHER): Payer: Medicare PPO | Admitting: Podiatry

## 2023-04-25 ENCOUNTER — Encounter: Payer: Self-pay | Admitting: Podiatry

## 2023-04-25 DIAGNOSIS — M79674 Pain in right toe(s): Secondary | ICD-10-CM

## 2023-04-25 DIAGNOSIS — E1142 Type 2 diabetes mellitus with diabetic polyneuropathy: Secondary | ICD-10-CM

## 2023-04-25 DIAGNOSIS — M79675 Pain in left toe(s): Secondary | ICD-10-CM | POA: Diagnosis not present

## 2023-04-25 DIAGNOSIS — B351 Tinea unguium: Secondary | ICD-10-CM

## 2023-05-02 ENCOUNTER — Telehealth: Payer: Self-pay | Admitting: *Deleted

## 2023-05-02 ENCOUNTER — Other Ambulatory Visit: Payer: Self-pay | Admitting: *Deleted

## 2023-05-02 NOTE — Telephone Encounter (Signed)
 Called pt to discuss her low hemoglobin and appts we have set up for her next week. Monday 3/24 is an iron infusion at 2 pm. Tues 3/25 labs/ see provider. Weds 3/26  10:30 is for a blood transfusion. Dr Donneta Romberg in agreement with these appts.

## 2023-05-02 NOTE — Telephone Encounter (Signed)
 Pt called again and I told her that there was already a message sent to Dr.B and his team and someone would call her when there was an update

## 2023-05-02 NOTE — Telephone Encounter (Signed)
 The pt. Says that the pulmonary md suggests that the cancer center can set her up with transfusion

## 2023-05-03 ENCOUNTER — Encounter: Payer: Self-pay | Admitting: Podiatry

## 2023-05-03 NOTE — Progress Notes (Addendum)
 Subjective:  Patient ID: Annette Foster, female    DOB: Sep 25, 1940,  MRN: [id number]  Annette Foster presents to clinic today for at risk foot care with history of diabetic neuropathy and painful, elongated thickened toenails x 10 which are symptomatic when wearing enclosed shoe gear. This interferes with his/her daily activities. Patient is recovering from pneumonia. Chief Complaint  Patient presents with   Diabetes    "I'm here to get my toenails cut." Endocrinologist - Dr. Tedd Sias, last visit 03/18/2023  A1c - 5.6   New problem(s): None.   PCP is Jaclyn Shaggy, MD.  Allergies  Allergen Reactions   Codeine Itching, Nausea And Vomiting and Other (See Comments)   Demeclocycline Rash   Demerol [Meperidine] Itching and Nausea And Vomiting   Hydrocodone Itching and Nausea And Vomiting   Morphine Nausea Only   Other Other (See Comments)   Oxycodone Itching and Nausea And Vomiting   Pentazocine Itching, Nausea And Vomiting and Other (See Comments)   Tetracyclines & Related Rash   Coal Tar Extract Other (See Comments)   Hydrocodone-Acetaminophen Other (See Comments)   Salicylic Acid Other (See Comments)   Tetracycline Hcl Other (See Comments)    Review of Systems: Negative except as noted in the HPI.  Objective: No changes noted in today's physical examination. There were no vitals filed for this visit. Annette Foster is a pleasant 83 y.o. female WD, WN in NAD. AAO x 3.   Vascular Examination: CFT <3 seconds b/l. DP/PT pulses faintly palpable b/l. Skin temperature gradient warm to warm b/l. No pain with calf compression. No ischemia or gangrene. No cyanosis or clubbing noted b/l.    Neurological Examination: Pt has subjective symptoms of neuropathy. Sensation grossly intact b/l with 10 gram monofilament. Vibratory sensation intact b/l.   Dermatological Examination: Pedal skin warm and supple b/l.   No open wounds. No interdigital macerations.  Toenails 1-5 b/l  thick, discolored, elongated with subungual debris and pain on dorsal palpation.    No corns, calluses nor porokeratotic lesions noted.  Musculoskeletal Examination: Muscle strength 5/5 to all lower extremity muscle groups bilaterally. Limited joint ROM to the dorsal midfoot b/l. HAV with bunion bilaterally and hammertoes 2-5 b/l. Patient ambulates independent of any assistive aids.  Radiographs: None  Last A1c:      Latest Ref Rng & Units 03/03/2023   11:25 AM 09/09/2022   12:04 AM  Hemoglobin A1C  Hemoglobin-A1c 4.8 - 5.6 % 5.6  7.2    Assessment/Plan: 1. Pain due to onychomycosis of toenails of both feet   2. Diabetic polyneuropathy associated with type 2 diabetes mellitus [facility])     Consent given for treatment. Patient examined. All patient's and/or POA's questions/concerns addressed on today's visit. Toenails 1-5 debrided in length and girth without incident. Continue foot and shoe inspections daily. Monitor blood glucose per PCP/Endocrinologist's recommendations. Continue soft, supportive shoe gear daily. Report any pedal injuries to medical professional. Call office if there are any questions/concerns. -Patient/POA to call should there be question/concern in the interim.   Return in about 3 months (around 07/26/2023).  Freddie Breech, DPM      [place] LOCATION: [address], [place] [address]  Office [phone]   [facility] LOCATION: [address] [place], [place] [address] Office [phone]

## 2023-05-05 ENCOUNTER — Other Ambulatory Visit: Payer: Self-pay | Admitting: *Deleted

## 2023-05-05 ENCOUNTER — Inpatient Hospital Stay: Attending: Internal Medicine

## 2023-05-05 VITALS — BP 97/55 | HR 64 | Resp 18

## 2023-05-05 DIAGNOSIS — K7460 Unspecified cirrhosis of liver: Secondary | ICD-10-CM | POA: Insufficient documentation

## 2023-05-05 DIAGNOSIS — K920 Hematemesis: Secondary | ICD-10-CM | POA: Insufficient documentation

## 2023-05-05 DIAGNOSIS — K7581 Nonalcoholic steatohepatitis (NASH): Secondary | ICD-10-CM | POA: Diagnosis not present

## 2023-05-05 DIAGNOSIS — D61818 Other pancytopenia: Secondary | ICD-10-CM | POA: Insufficient documentation

## 2023-05-05 DIAGNOSIS — D500 Iron deficiency anemia secondary to blood loss (chronic): Secondary | ICD-10-CM | POA: Insufficient documentation

## 2023-05-05 DIAGNOSIS — D6959 Other secondary thrombocytopenia: Secondary | ICD-10-CM | POA: Insufficient documentation

## 2023-05-05 DIAGNOSIS — Z86000 Personal history of in-situ neoplasm of breast: Secondary | ICD-10-CM | POA: Diagnosis not present

## 2023-05-05 DIAGNOSIS — Z800 Family history of malignant neoplasm of digestive organs: Secondary | ICD-10-CM | POA: Insufficient documentation

## 2023-05-05 DIAGNOSIS — K921 Melena: Secondary | ICD-10-CM | POA: Diagnosis present

## 2023-05-05 DIAGNOSIS — E611 Iron deficiency: Secondary | ICD-10-CM

## 2023-05-05 DIAGNOSIS — I4891 Unspecified atrial fibrillation: Secondary | ICD-10-CM | POA: Insufficient documentation

## 2023-05-05 DIAGNOSIS — Z803 Family history of malignant neoplasm of breast: Secondary | ICD-10-CM | POA: Insufficient documentation

## 2023-05-05 DIAGNOSIS — I495 Sick sinus syndrome: Secondary | ICD-10-CM | POA: Insufficient documentation

## 2023-05-05 MED ORDER — IRON SUCROSE 20 MG/ML IV SOLN
200.0000 mg | Freq: Once | INTRAVENOUS | Status: AC
  Administered 2023-05-05: 200 mg via INTRAVENOUS
  Filled 2023-05-05: qty 10

## 2023-05-05 NOTE — Patient Instructions (Signed)

## 2023-05-06 ENCOUNTER — Telehealth: Payer: Self-pay

## 2023-05-06 ENCOUNTER — Inpatient Hospital Stay

## 2023-05-06 ENCOUNTER — Inpatient Hospital Stay (HOSPITAL_BASED_OUTPATIENT_CLINIC_OR_DEPARTMENT_OTHER): Admitting: Nurse Practitioner

## 2023-05-06 ENCOUNTER — Encounter: Payer: Self-pay | Admitting: Nurse Practitioner

## 2023-05-06 VITALS — BP 129/55 | HR 65 | Temp 96.0°F | Resp 18 | Wt 145.1 lb

## 2023-05-06 DIAGNOSIS — K920 Hematemesis: Secondary | ICD-10-CM | POA: Diagnosis not present

## 2023-05-06 DIAGNOSIS — K921 Melena: Secondary | ICD-10-CM | POA: Diagnosis not present

## 2023-05-06 DIAGNOSIS — E611 Iron deficiency: Secondary | ICD-10-CM

## 2023-05-06 DIAGNOSIS — D61818 Other pancytopenia: Secondary | ICD-10-CM

## 2023-05-06 DIAGNOSIS — D500 Iron deficiency anemia secondary to blood loss (chronic): Secondary | ICD-10-CM | POA: Diagnosis not present

## 2023-05-06 LAB — CMP (CANCER CENTER ONLY)
ALT: 22 U/L (ref 0–44)
AST: 30 U/L (ref 15–41)
Albumin: 3.3 g/dL — ABNORMAL LOW (ref 3.5–5.0)
Alkaline Phosphatase: 53 U/L (ref 38–126)
Anion gap: 9 (ref 5–15)
BUN: 31 mg/dL — ABNORMAL HIGH (ref 8–23)
CO2: 27 mmol/L (ref 22–32)
Calcium: 8.5 mg/dL — ABNORMAL LOW (ref 8.9–10.3)
Chloride: 102 mmol/L (ref 98–111)
Creatinine: 1.32 mg/dL — ABNORMAL HIGH (ref 0.44–1.00)
GFR, Estimated: 40 mL/min — ABNORMAL LOW (ref >60)
Glucose, Bld: 134 mg/dL — ABNORMAL HIGH (ref 70–99)
Potassium: 3.5 mmol/L (ref 3.5–5.1)
Sodium: 138 mmol/L (ref 135–145)
Total Bilirubin: 1 mg/dL (ref 0.0–1.2)
Total Protein: 7 g/dL (ref 6.5–8.1)

## 2023-05-06 LAB — IRON AND TIBC
Iron: 178 ug/dL — ABNORMAL HIGH (ref 28–170)
Saturation Ratios: 55 % — ABNORMAL HIGH (ref 10.4–31.8)
TIBC: 326 ug/dL (ref 250–450)
UIBC: 148 ug/dL

## 2023-05-06 LAB — CBC WITH DIFFERENTIAL (CANCER CENTER ONLY)
Abs Immature Granulocytes: 0.01 10*3/uL (ref 0.00–0.07)
Basophils Absolute: 0 10*3/uL (ref 0.0–0.1)
Basophils Relative: 1 %
Eosinophils Absolute: 0.1 10*3/uL (ref 0.0–0.5)
Eosinophils Relative: 3 %
HCT: 28.7 % — ABNORMAL LOW (ref 36.0–46.0)
Hemoglobin: 9.4 g/dL — ABNORMAL LOW (ref 12.0–15.0)
Immature Granulocytes: 1 %
Lymphocytes Relative: 33 %
Lymphs Abs: 0.7 10*3/uL (ref 0.7–4.0)
MCH: 31.8 pg (ref 26.0–34.0)
MCHC: 32.8 g/dL (ref 30.0–36.0)
MCV: 97 fL (ref 80.0–100.0)
Monocytes Absolute: 0.2 10*3/uL (ref 0.1–1.0)
Monocytes Relative: 11 %
Neutro Abs: 1.1 10*3/uL — ABNORMAL LOW (ref 1.7–7.7)
Neutrophils Relative %: 51 %
Platelet Count: 83 10*3/uL — ABNORMAL LOW (ref 150–400)
RBC: 2.96 MIL/uL — ABNORMAL LOW (ref 3.87–5.11)
RDW: 17.1 % — ABNORMAL HIGH (ref 11.5–15.5)
WBC Count: 2.2 10*3/uL — ABNORMAL LOW (ref 4.0–10.5)
nRBC: 0 % (ref 0.0–0.2)

## 2023-05-06 LAB — SAMPLE TO BLOOD BANK

## 2023-05-06 LAB — FERRITIN: Ferritin: 272 ng/mL (ref 11–307)

## 2023-05-06 NOTE — Telephone Encounter (Signed)
   Primary Cardiologist: Yvonne Kendall, MD  Called patient to ensure no change in cardiac history since last office visit on 02/26/23.   Chart reviewed as part of pre-operative protocol coverage. Given past medical history and time since last visit, based on ACC/AHA guidelines, Gaelyn Tukes would be at acceptable risk for the planned procedure without further cardiovascular testing.   Patient was advised that if she develops new symptoms prior to surgery to contact our office to arrange a follow-up appointment.  He verbalized understanding.  I will route this recommendation to the requesting party via Epic fax function and remove from pre-op pool.  Please call with questions.  Levi Aland, NP-C  05/06/2023, 4:14 PM [address] Office [phone] Fax [phone]

## 2023-05-06 NOTE — Telephone Encounter (Signed)
   Pre-operative Risk Assessment    Patient Name: Annette Foster  DOB: Dec 19, 1940 MRN: [id number]   Date of last office visit: 02/26/23 Terrilee Croak, PA-C Date of next office visit: 06/26/23 [facility] END, MD   Request for Surgical Clearance    Procedure:   EGD  Date of Surgery:  Clearance TBD URGENT                                Surgeon:  NOT INDICATED Surgeon's Group or Practice Name:  [facility] Phone number:  [phone] Fax number:  [phone]  ATTN: MELANIE   Type of Clearance Requested:   - Medical    Type of Anesthesia:  General    Additional requests/questions:    SignedMarlow Baars   05/06/2023, 3:52 PM

## 2023-05-06 NOTE — Progress Notes (Signed)
 [facility] OFFICE PROGRESS NOTE  Patient Care Team: Jaclyn Shaggy, MD as PCP - General (Internal Medicine) End, Cristal Deer, MD as PCP - Cardiology (Cardiology) Lanier Prude, MD as PCP - Electrophysiology (Cardiology) Earna Coder, MD as Consulting Physician (Internal Medicine)   Cancer Staging  No matching staging information was found for the patient.  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 OCT 2018- BMBx- MILD dyspoiesis; variable cellularity [10-50%]; FISH/Cytogenetics-NORMAL; F-One-NGS-declined by insurance.    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   #Frequent UTIs [2019-2020; Dr. Brandon/uro-gyne,[facility]]; Dr.Wohl/  # HRT [clinical trial thru [facility]; stopped July 2017 ]; CPAP   DIAGNOSIS: Left breast DCIS  STAGE:    0     ;GOALS: cure  CURRENT/MOST RECENT THERAPY: surveillaince    Ductal carcinoma in situ (DCIS) of left breast    INTERVAL HISTORY: Alone.  Ambulating independently.  Annette Foster 83 y.o. female pleasant patient with above history of DCIS; cirrhosis portal hypertension; A. fib and pancytopenia is here for follow-up and consideration of blood transfusion. 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 Before repeat endoscopy could be performed she developed cough and was found to have flu and positive blood work for mycoplasma pneumonia. She  completed antibiotics and saw pulmonology for follow up. At that time she reported 2 days of vomiting coffee grounds and black stools. Shortness of breath had worsened and she was found to have hemoglobin 7.5 on 05/02/23. She received iron infusion at [facility] on 3/24 and returns to clinic for follow up and reevaluation today.   Review of Systems  Constitutional:  Positive for malaise/fatigue. Negative for chills, diaphoresis, fever and weight loss.  HENT:  Negative for nosebleeds and sore throat.   Eyes:  Negative for double vision.  Respiratory:  Negative for cough, hemoptysis, sputum production, shortness of breath and wheezing.   Cardiovascular:  Negative for chest pain, palpitations, orthopnea and leg swelling.  Gastrointestinal:  Positive for melena and vomiting. Negative for abdominal pain, blood in stool, constipation, diarrhea, heartburn and nausea.  Genitourinary:  Negative for hematuria.  Musculoskeletal:  Positive for back pain and joint pain.  Skin: Negative.  Negative for itching and rash.  Neurological:  Negative for dizziness, tingling, focal weakness, weakness and headaches.  Endo/Heme/Allergies:  Does not bruise/bleed easily.  Psychiatric/Behavioral:  Negative for depression. The patient is not nervous/anxious and does not have insomnia.    PAST MEDICAL HISTORY :  Past Medical History:  Diagnosis Date   Abdominal pain    Allergy    Cancer (HCC) 2017   breast cancer- Left   Cataract    CHF (congestive heart failure) (HCC)    Collagenous colitis    Coronary artery disease    Diabetes mellitus without complication (HCC)    Diarrhea    Diverticulosis    Dysrhythmia    Fatty liver disease, nonalcoholic    Fibrocystic breast    GERD (gastroesophageal reflux disease)    Heart murmur    Hyperlipidemia  Hypertension    Hypothyroidism    IBS (irritable bowel syndrome)    IDA (iron deficiency anemia)    Liver cirrhosis (HCC)    Personal history of radiation therapy 2017    LEFT BREAST CA   PONV (postoperative nausea and vomiting)    Presence of permanent cardiac pacemaker    Sleep apnea    C-Pap   PAST SURGICAL HISTORY :   Past Surgical History:  Procedure Laterality Date   ABDOMINAL HYSTERECTOMY     tah bso   ABDOMINAL SURGERY     APPENDECTOMY     AV NODE ABLATION N/A 05/17/2021   Procedure: AV NODE ABLATION;  Surgeon: Lanier Prude, MD;  [facility];  Service: Cardiovascular;  Laterality: N/A;   BREAST BIOPSY Bilateral 2016   negative   BREAST 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/2015   DCIS, papillary carcinoma in situ   BREAST BIOPSY Left 05/27/2016   BENIGN MAMMARY EPITHELIUM   BREAST BIOPSY Left 11/20/2017   affirm bx x clip BENIGN MAMMARY EPITHELIUM CONSISTENT WITH RAD THERAPY   BREAST EXCISIONAL BIOPSY Right    NEG 1980's   BREAST LUMPECTOMY Left 10/17/2015   DCIS and papillary carcinoma insitu, clear margins   CARDIAC SURGERY     has replacement valve   CATARACT EXTRACTION Right    CATARACT EXTRACTION W/PHACO Left 01/16/2021   Procedure: CATARACT EXTRACTION PHACO AND INTRAOCULAR LENS PLACEMENT (IOC) LEFT DIABETIC 8.46 00:59.8;  Surgeon: Galen Manila, MD;  Location: [facility];  Service: Ophthalmology;  Laterality: Left;   CHOLECYSTECTOMY     COLONOSCOPY WITH PROPOFOL N/A 03/11/2016   Procedure: COLONOSCOPY WITH PROPOFOL;  Surgeon: Scot Jun, MD;  Location: [facility] ENDOSCOPY;  Service: Endoscopy;  Laterality: N/A;   COLONOSCOPY WITH PROPOFOL N/A 02/18/2020   Procedure: COLONOSCOPY WITH PROPOFOL;  Surgeon: Midge Minium, MD;  Location: [facility] ENDOSCOPY;  Service: Endoscopy;  Laterality: N/A;   COLONOSCOPY WITH PROPOFOL N/A 09/10/2022   Procedure: COLONOSCOPY WITH PROPOFOL;  Surgeon: Wyline Mood, MD;  Location: [facility] ENDOSCOPY;  Service: Gastroenterology;  Laterality: N/A;   ESOPHAGEAL BANDING  09/10/2022   Procedure: ESOPHAGEAL BANDING;  Surgeon: Wyline Mood, MD;  Location: [facility] ENDOSCOPY;  Service: Gastroenterology;;    ESOPHAGEAL BANDING  10/17/2022   Procedure: ESOPHAGEAL BANDING;  Surgeon: Midge Minium, MD;  Location: [facility] ENDOSCOPY;  Service: Endoscopy;;   ESOPHAGEAL BANDING  12/24/2022   Procedure: ESOPHAGEAL BANDING;  Surgeon: Midge Minium, MD;  Location: [facility] ENDOSCOPY;  Service: Endoscopy;;   ESOPHAGOGASTRODUODENOSCOPY (EGD) WITH PROPOFOL N/A 03/11/2016   Procedure: ESOPHAGOGASTRODUODENOSCOPY (EGD) WITH PROPOFOL;  Surgeon: Scot Jun, MD;  Location: [facility] ENDOSCOPY;  Service: Endoscopy;  Laterality: N/A;   ESOPHAGOGASTRODUODENOSCOPY (EGD) WITH PROPOFOL N/A 02/18/2020   Procedure: ESOPHAGOGASTRODUODENOSCOPY (EGD) WITH PROPOFOL;  Surgeon: Midge Minium, MD;  Location: [facility] ENDOSCOPY;  Service: Endoscopy;  Laterality: N/A;   ESOPHAGOGASTRODUODENOSCOPY (EGD) WITH PROPOFOL N/A 04/25/2020   Procedure: ESOPHAGOGASTRODUODENOSCOPY (EGD) WITH PROPOFOL;  Surgeon: Midge Minium, MD;  Location: [facility] ENDOSCOPY;  Service: Endoscopy;  Laterality: N/A;   ESOPHAGOGASTRODUODENOSCOPY (EGD) WITH PROPOFOL N/A 05/23/2020   Procedure: ESOPHAGOGASTRODUODENOSCOPY (EGD) WITH PROPOFOL;  Surgeon: Midge Minium, MD;  Location: [facility] ENDOSCOPY;  Service: Endoscopy;  Laterality: N/A;   ESOPHAGOGASTRODUODENOSCOPY (EGD) WITH PROPOFOL N/A 09/10/2022   Procedure: ESOPHAGOGASTRODUODENOSCOPY (EGD) WITH PROPOFOL;  Surgeon: Wyline Mood, MD;  Location: [facility] ENDOSCOPY;  Service: Gastroenterology;  Laterality: N/A;   ESOPHAGOGASTRODUODENOSCOPY (EGD) WITH PROPOFOL N/A 10/17/2022   Procedure: ESOPHAGOGASTRODUODENOSCOPY (EGD) WITH PROPOFOL;  Surgeon: Midge Minium, MD;  Location: [facility] ENDOSCOPY;  Service: Endoscopy;  Laterality: N/A;   ESOPHAGOGASTRODUODENOSCOPY (EGD) WITH PROPOFOL N/A 12/24/2022   Procedure: ESOPHAGOGASTRODUODENOSCOPY (EGD) WITH PROPOFOL;  Surgeon: Midge Minium, MD;  Location: [facility] ENDOSCOPY;  Service: Endoscopy;  Laterality: N/A;   ESOPHAGOGASTRODUODENOSCOPY (EGD) WITH PROPOFOL N/A 01/29/2023   Procedure: ESOPHAGOGASTRODUODENOSCOPY  (EGD) WITH PROPOFOL;  Surgeon: Regis Bill, MD;  Location: [facility] ENDOSCOPY;  Service: Endoscopy;  Laterality: N/A;  Ideally we would intubate if possible given concern for varices   EUS N/A 10/04/2021   Procedure: LOWER ENDOSCOPIC ULTRASOUND (EUS);  Surgeon: Doren Custard, MD;  Location: [facility] ENDOSCOPY;  Service: Gastroenterology;  Laterality: N/A;  [facility]   EYE SURGERY     FINGER ARTHROSCOPY WITH CARPOMETACARPEL ([facility]) ARTHROPLASTY Right 09/03/2018   Procedure: CARPOMETACARPEL [facility]) ARTHROPLASTY RIGHT THUMB;  Surgeon: Kennedy Bucker, MD;  Location: [facility] ORS;  Service: Orthopedics;  Laterality: Right;   FLEXIBLE SIGMOIDOSCOPY N/A 09/14/2021   Procedure: FLEXIBLE SIGMOIDOSCOPY;  Surgeon: Midge Minium, MD;  Location: [facility] ENDOSCOPY;  Service: Endoscopy;  Laterality: N/A;  No anesthesia   GANGLION CYST EXCISION Right 09/03/2018   Procedure: REMOVAL GANGLION OF WRIST;  Surgeon: Kennedy Bucker, MD;  Location: [facility] ORS;  Service: Orthopedics;  Laterality: Right;   HARDWARE REMOVAL Right 09/03/2018   Procedure: HARDWARE REMOVAL RIGHT THUMB;  Surgeon: Kennedy Bucker, MD;  Location: [facility] ORS;  Service: Orthopedics;  Laterality: Right;  staple removed   HEMOSTASIS CLIP PLACEMENT  09/10/2022   Procedure: HEMOSTASIS CLIP PLACEMENT;  Surgeon: Wyline Mood, MD;  Location: [facility] ENDOSCOPY;  Service: Gastroenterology;;   HEMOSTASIS CONTROL  09/10/2022   Procedure: HEMOSTASIS CONTROL;  Surgeon: Wyline Mood, MD;  Location: [facility] ENDOSCOPY;  Service: Gastroenterology;;   HEMOSTASIS CONTROL  01/29/2023   Procedure: HEMOSTASIS CONTROL;  Surgeon: Regis Bill, MD;  Location: [facility] ENDOSCOPY;  Service: Endoscopy;;   JOINT REPLACEMENT Left    TKR   left sinusplasty      MASTECTOMY, PARTIAL Left 10/17/2015   Procedure: MASTECTOMY PARTIAL REVISION;  Surgeon: Nadeen Landau, MD;  Location: [facility] ORS;  Service: General;  Laterality: Left;   PACEMAKER IMPLANT N/A 03/23/2021   Procedure: PACEMAKER IMPLANT;   Surgeon: Lanier Prude, MD;  Location: [facility] LAB;  Service: Cardiovascular;  Laterality: N/A;   PARTIAL MASTECTOMY WITH NEEDLE LOCALIZATION Left 09/29/2015   Procedure: PARTIAL MASTECTOMY WITH NEEDLE LOCALIZATION;  Surgeon: Nadeen Landau, MD;  Location: [facility] ORS;  Service: General;  Laterality: Left;   POLYPECTOMY  09/10/2022   Procedure: POLYPECTOMY;  Surgeon: Wyline Mood, MD;  Location: [facility] ENDOSCOPY;  Service: Gastroenterology;;   RECTAL EXAM UNDER ANESTHESIA N/A 10/05/2021   Procedure: RECTAL EXAM UNDER ANESTHESIA, ASPIRATION OF RECTAL CYST;  Surgeon: Carolan Shiver, MD;  Location: [facility] ORS;  Service: General;  Laterality: N/A;   TOTAL ABDOMINAL HYSTERECTOMY W/ BILATERAL SALPINGOOPHORECTOMY     FAMILY HISTORY :   Family History  Problem Relation Age of Onset   Breast cancer Paternal Grandmother    Colon cancer Father    Diabetes Sister    Diabetes Brother    Heart disease Brother    Prostate cancer Brother    Colon cancer Maternal Uncle    Prostate cancer Brother    Bladder Cancer Brother    Leukemia Mother        all   Ovarian cancer Neg Hx    Kidney cancer Neg Hx    SOCIAL HISTORY:   Social History   Tobacco Use   Smoking status: Never   Smokeless tobacco: Never  Vaping  Use   Vaping status: Never Used  Substance Use Topics   Alcohol use: Never   Drug use: Never   ALLERGIES:  is allergic to codeine, demeclocycline, demerol [meperidine], hydrocodone, morphine, other, oxycodone, pentazocine, tetracyclines & related, coal tar extract, hydrocodone-acetaminophen, salicylic acid, and tetracycline hcl.  MEDICATIONS:  Current Outpatient Medications  Medication Sig Dispense Refill   Azelastine HCl 137 MCG/SPRAY SOLN INHALE 1-2 PUFFS IN EACH NOSTRIL NASALLY ONCE A DAY 30 DAY(S)     benzonatate (TESSALON) 100 MG capsule Take 100 mg by mouth 3 (three) times daily as needed.     Cholecalciferol (VITAMIN D) 50 MCG (2000 UT) CAPS Take 2,000 Units by mouth  daily.     clobetasol ointment (TEMOVATE) 0.05 % Apply 1 application topically as needed (vaginal irritation).     clotrimazole (LOTRIMIN) 1 % cream Apply 1 Application topically 2 (two) times daily as needed.     cyanocobalamin (,VITAMIN B-12,) 1000 MCG/ML injection 1,000 mcg every 30 (thirty) days.     dicyclomine (BENTYL) 10 MG capsule Take 10 mg by mouth in the morning, at noon, and at bedtime.     docusate sodium (COLACE) 100 MG capsule Take 100 mg by mouth 3 (three) times daily as needed for moderate constipation.     esomeprazole (NEXIUM) 40 MG capsule Take 40 mg by mouth 2 (two) times daily before a meal.      fenofibrate 160 MG tablet Take 160 mg by mouth at bedtime.     gabapentin (NEURONTIN) 300 MG capsule Take 300 mg by mouth 2 (two) times daily.     Insulin Aspart (NOVOLOG FLEXPEN [place]) Inject into the skin. Sliding scale     levocetirizine (XYZAL) 5 MG tablet Take 5 mg by mouth every evening.     levothyroxine (SYNTHROID, LEVOTHROID) 75 MCG tablet Take 75 mcg by mouth daily before breakfast.      magnesium oxide (MAG-OX) 400 MG tablet Take 400 mg by mouth 2 (two) times daily.      nadolol (CORGARD) 20 MG tablet Take 1 tablet (20 mg total) by mouth daily. 90 tablet 3   NONFORMULARY OR COMPOUNDED ITEM Nifedipine 0.3% plus lidocaine 2% cream apply to rectum BID and after every BM.     potassium chloride SA (K-DUR,KLOR-CON) 20 MEQ tablet Take 20 mEq by mouth 2 (two) times daily.      saccharomyces boulardii (FLORASTOR) 250 MG capsule Take 250 mg by mouth 2 (two) times daily.      simvastatin (ZOCOR) 20 MG tablet Take 20 mg by mouth at bedtime.     spironolactone (ALDACTONE) 25 MG tablet TAKE 1/2 TABLET BY MOUTH DAILY 45 tablet 3   torsemide (DEMADEX) 20 MG tablet Take 20 mg by mouth daily.     valACYclovir (VALTREX) 500 MG tablet Take 500 mg by mouth 2 (two) times daily as needed.     No current facility-administered medications for this visit.   PHYSICAL EXAMINATION: ECOG  PERFORMANCE STATUS: 1 - Symptomatic but completely ambulatory  BP (!) 129/55 (BP Location: Left Arm, Patient Position: Sitting, Cuff Size: Normal)   Pulse 65   Temp (!) 96 F (35.6 C) (Tympanic)   Resp 18   Wt 145 lb 1.6 oz (65.8 kg)   LMP  (LMP Unknown)   SpO2 100%   BMI 26.54 kg/m   Filed Weights   05/06/23 1029  Weight: 145 lb 1.6 oz (65.8 kg)   Physical Exam Constitutional:      Comments: Alone.  Ambulating  independently.  HENT:     Head: Normocephalic and atraumatic.     Mouth/Throat:     Pharynx: No oropharyngeal exudate.  Eyes:     Pupils: Pupils are equal, round, and reactive to light.  Cardiovascular:     Rate and Rhythm: Tachycardia present. Rhythm irregular.     Heart sounds: Murmur heard.  Pulmonary:     Effort: Pulmonary effort is normal. No respiratory distress.     Breath sounds: Normal breath sounds. No wheezing.  Abdominal:     General: Bowel sounds are normal. There is no distension.     Palpations: Abdomen is soft. There is no mass.     Tenderness: There is no abdominal tenderness. There is no guarding or rebound.     Comments: Positive for splenomegaly.  Musculoskeletal:        General: No tenderness. Normal range of motion.     Cervical back: Normal range of motion and neck supple.  Skin:    General: Skin is warm.  Neurological:     Mental Status: She is alert and oriented to person, place, and time.  Psychiatric:        Mood and Affect: Affect normal.    LABORATORY DATA:  I have reviewed the data as listed    Component Value Date/Time   NA 138 05/06/2023 1011   NA 138 10/03/2022 1509   K 3.5 05/06/2023 1011   CL 102 05/06/2023 1011   CO2 27 05/06/2023 1011   GLUCOSE 134 (H) 05/06/2023 1011   BUN 31 (H) 05/06/2023 1011   BUN 15 10/03/2022 1509   CREATININE 1.32 (H) 05/06/2023 1011   CALCIUM 8.5 (L) 05/06/2023 1011   PROT 7.0 05/06/2023 1011   PROT 7.1 11/30/2020 0944   ALBUMIN 3.3 (L) 05/06/2023 1011   ALBUMIN 3.8 11/30/2020 0944    AST 30 05/06/2023 1011   ALT 22 05/06/2023 1011   ALKPHOS 53 05/06/2023 1011   BILITOT 1.0 05/06/2023 1011   GFRNONAA 40 (L) 05/06/2023 1011   GFRAA 80 02/25/2020 1105   Lab Results  Component Value Date   WBC 2.2 (L) 05/06/2023   NEUTROABS 1.1 (L) 05/06/2023   HGB 9.4 (L) 05/06/2023   HCT 28.7 (L) 05/06/2023   MCV 97.0 05/06/2023   PLT 83 (L) 05/06/2023   Iron/TIBC/Ferritin/ %Sat    Component Value Date/Time   IRON 178 (H) 05/06/2023 1011   TIBC 326 05/06/2023 1011   FERRITIN 272 05/06/2023 1011   IRONPCTSAT 55 (H) 05/06/2023 1011    RADIOGRAPHIC STUDIES: I have personally reviewed the radiological images as listed and agreed with the findings in the report. No results found.   ASSESSMENT & PLAN:   Pancytopenia  # Anemia-secondary GI bleed chronic/liver disease.  Hemoglobin-10- [Jan 2025- UGIB][Recent Upper GIB]; cannot tolerate PO iron. Receives venofer. Apparent blood loss in setting of hematemesis and melena as well as drop in hemoglobin. She received venofer yesterday. Today, hemoglobin improved from 7 to 9.4. Symptomatic. Hold transfusion. Plan for venofer tomorrow instead of blood. Plan for 2 more doses of venofer over next 2 weeks. Iron studies today consistent with recent venofer infusion and likely not reflective of blood loss.   # Melena & hematemesis- reached out to Dr Servando Snare who will schedule her for endoscopy.    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  ANC-1000;  Platelets 50-60s  stable.     # Tachy-brady syndrome- Hx of A.fib [Dr.End; no wtachnam device]- no anticoagulation.  stable. She  has follow up with cardiology tomorrow.    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     # DCIS/papillary carcinoma in situ-stable no clinical evidence of recurrence. BIL Mammogram JAN 2023 [Dr.Cintron]-within normal  limits.  Not on AI.  Stable.    # DISPOSITION:  Cancel blood tomorrow Give IV Venofer instead 1 week- lab (cbc, hold tube), venofer 2 week- lab (cbc, hold tube), venofer Follow up with GB on 4/23 as scheduled, add +/- venofer if not already scheduled- la   No problem-specific Assessment & Plan notes found for this encounter.   No orders of the defined types were placed in this encounter.  All questions were answered. The patient knows to call the clinic with any problems, questions or concerns.     Alinda Dooms, NP 05/06/2023   CC: Dr Donneta Romberg & Dr Servando Snare

## 2023-05-07 ENCOUNTER — Ambulatory Visit: Payer: Medicare PPO | Attending: Cardiology | Admitting: Cardiology

## 2023-05-07 ENCOUNTER — Encounter: Payer: Medicare PPO | Admitting: Cardiology

## 2023-05-07 ENCOUNTER — Encounter: Payer: Self-pay | Admitting: Cardiology

## 2023-05-07 ENCOUNTER — Other Ambulatory Visit: Payer: Self-pay | Admitting: Internal Medicine

## 2023-05-07 ENCOUNTER — Inpatient Hospital Stay

## 2023-05-07 ENCOUNTER — Other Ambulatory Visit: Payer: Self-pay

## 2023-05-07 ENCOUNTER — Ambulatory Visit: Payer: Medicare PPO | Admitting: Cardiology

## 2023-05-07 VITALS — BP 103/58 | HR 64 | Temp 98.0°F | Resp 18

## 2023-05-07 VITALS — BP 82/40 | HR 69 | Ht 63.0 in | Wt 142.4 lb

## 2023-05-07 DIAGNOSIS — Z950 Presence of cardiac pacemaker: Secondary | ICD-10-CM | POA: Diagnosis not present

## 2023-05-07 DIAGNOSIS — E611 Iron deficiency: Secondary | ICD-10-CM

## 2023-05-07 DIAGNOSIS — Z8719 Personal history of other diseases of the digestive system: Secondary | ICD-10-CM | POA: Diagnosis not present

## 2023-05-07 DIAGNOSIS — D500 Iron deficiency anemia secondary to blood loss (chronic): Secondary | ICD-10-CM | POA: Diagnosis not present

## 2023-05-07 DIAGNOSIS — I4891 Unspecified atrial fibrillation: Secondary | ICD-10-CM | POA: Diagnosis not present

## 2023-05-07 DIAGNOSIS — K920 Hematemesis: Secondary | ICD-10-CM

## 2023-05-07 LAB — AFP TUMOR MARKER: AFP, Serum, Tumor Marker: 2.4 ng/mL (ref 0.0–8.7)

## 2023-05-07 MED ORDER — SODIUM CHLORIDE 0.9% FLUSH
10.0000 mL | Freq: Once | INTRAVENOUS | Status: AC | PRN
  Administered 2023-05-07: 10 mL
  Filled 2023-05-07: qty 10

## 2023-05-07 MED ORDER — IRON SUCROSE 20 MG/ML IV SOLN
200.0000 mg | Freq: Once | INTRAVENOUS | Status: AC
  Administered 2023-05-07: 200 mg via INTRAVENOUS

## 2023-05-07 NOTE — Progress Notes (Signed)
 1000: 05/07/23 iron studies elevated and Ferritin 272. Per Consuello Masse NP proceed with Venofer as scheduled.

## 2023-05-07 NOTE — Progress Notes (Signed)
  Electrophysiology Office Follow up Visit Note:    Date:  05/07/2023   ID:  Sintia Mckissic, DOB 09/13/1940, MRN [id number]  PCP:  Jaclyn Shaggy, MD  [facility] Cardiologist:  Yvonne Kendall, MD  [facility] Electrophysiologist:  Lanier Prude, MD    Interval History:     Annette Foster is a 83 y.o. female who presents for a follow up visit.   I last saw the patient Jun 20, 2021.  She has a history of AV junction ablation in April 2023 with a permanent pacemaker implant on March 23, 2021.  At that appointment she was doing well.  She is not anticoagulated given a history of GI bleeding. She most recently saw Cadence in the clinic February 26, 2023.  At that appointment she was doing overall well.  She is doing well today.  No lightheadedness or dizziness.  Her blood pressure today is in the 80s systolic which is around what she is running at home.  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  No problems with her pacemaker that she is aware of.  She continues driving.  She visits her husband who is in a memory care unit in [facility] which is about it 20 to 30-minute drive away from her home.       Past medical, surgical, social and family history were reviewed.  ROS:   Please see the history of present illness.    All other systems reviewed and are negative.  EKGs/Labs/Other Studies Reviewed:    The following studies were reviewed today:  May 07, 2023 in clinic device interrogation personally reviewed Battery longevity 6 years 3 months Lead parameter stable Atrial pacing 81% Ventricular pacing 97% 5% atrial arrhythmia burden. No programming changes made today        Physical Exam:    VS: BP (!) 82/40   Pulse 69   Ht 5\' 3"  (1.6 m)   Wt 142 lb 6.4 oz (64.6 kg)   LMP  (LMP Unknown)   SpO2 99%   BMI 25.23 kg/m     Wt Readings from Last 3 Encounters:  05/07/23 142 lb 6.4 oz (64.6 kg)  05/06/23 145 lb  1.6 oz (65.8 kg)  03/06/23 147 lb 3.2 oz (66.8 kg)     GEN: no distress CARD: RRR, No MRG.  CIED pocket well-healed RESP: No IWOB. CTAB.      ASSESSMENT:    1. Cardiac pacemaker in situ   2. Atrial fibrillation, unspecified type (HCC)   3. History of GI bleed    PLAN:    In order of problems listed above:  #Atrial fibrillation flutter #Post AV junction ablation with permanent pacemaker implant Doing well from an A-fib perspective.  Not anticoagulated given history of severe GI bleeding.  She is not a candidate for left atrial appendage occlusion given anatomical considerations.  #Permanent pacemaker in situ Device functioning appropriately.  Continue remote monitoring.  #Hypertension Asymptomatic.  Associated with advanced cirrhosis.  No lightheadedness or dizziness today.  She tells me that her blood pressures have been running low at home.  She will continue to monitor her pressures.  She has an appointment next week with GI for endoscopy.   Follow-up 1 year with EP APP.   Signed, Steffanie Dunn, MD, [facility] 05/07/2023 3:05 PM    Electrophysiology [facility]

## 2023-05-07 NOTE — Patient Instructions (Signed)
 Medication Instructions:  Your physician recommends that you continue on your current medications as directed. Please refer to the Current Medication list given to you today.  *If you need a refill on your cardiac medications before your next appointment, please call your pharmacy*  Follow-Up: At [facility], you and your health needs are our priority.  As part of our continuing mission to provide you with exceptional heart care, we have created designated Provider Care Teams.  These Care Teams include your primary Cardiologist (physician) and Advanced Practice Providers (APPs -  Physician Assistants and Nurse Practitioners) who all work together to provide you with the care you need, when you need it.  Your next appointment:   1 year  Provider:   You will see one of the following Advanced Practice Providers on your designated Care Team:   Francis Dowse, Charlott Holler [address] [place], [place] Sherie Don, NP Canary Brim, NP

## 2023-05-12 ENCOUNTER — Inpatient Hospital Stay

## 2023-05-12 VITALS — BP 106/53 | HR 72 | Temp 97.2°F | Resp 18

## 2023-05-12 DIAGNOSIS — E611 Iron deficiency: Secondary | ICD-10-CM

## 2023-05-12 DIAGNOSIS — D500 Iron deficiency anemia secondary to blood loss (chronic): Secondary | ICD-10-CM | POA: Diagnosis not present

## 2023-05-12 LAB — CBC WITH DIFFERENTIAL (CANCER CENTER ONLY)
Abs Immature Granulocytes: 0 10*3/uL (ref 0.00–0.07)
Basophils Absolute: 0 10*3/uL (ref 0.0–0.1)
Basophils Relative: 1 %
Eosinophils Absolute: 0.1 10*3/uL (ref 0.0–0.5)
Eosinophils Relative: 3 %
HCT: 29.3 % — ABNORMAL LOW (ref 36.0–46.0)
Hemoglobin: 9.3 g/dL — ABNORMAL LOW (ref 12.0–15.0)
Immature Granulocytes: 0 %
Lymphocytes Relative: 34 %
Lymphs Abs: 0.7 10*3/uL (ref 0.7–4.0)
MCH: 31.4 pg (ref 26.0–34.0)
MCHC: 31.7 g/dL (ref 30.0–36.0)
MCV: 99 fL (ref 80.0–100.0)
Monocytes Absolute: 0.3 10*3/uL (ref 0.1–1.0)
Monocytes Relative: 14 %
Neutro Abs: 1 10*3/uL — ABNORMAL LOW (ref 1.7–7.7)
Neutrophils Relative %: 48 %
Platelet Count: 56 10*3/uL — ABNORMAL LOW (ref 150–400)
RBC: 2.96 MIL/uL — ABNORMAL LOW (ref 3.87–5.11)
RDW: 16 % — ABNORMAL HIGH (ref 11.5–15.5)
WBC Count: 2.1 10*3/uL — ABNORMAL LOW (ref 4.0–10.5)
nRBC: 0 % (ref 0.0–0.2)

## 2023-05-12 LAB — SAMPLE TO BLOOD BANK

## 2023-05-12 MED ORDER — IRON SUCROSE 20 MG/ML IV SOLN
200.0000 mg | Freq: Once | INTRAVENOUS | Status: AC
Start: 2023-05-12 — End: 2023-05-12
  Administered 2023-05-12: 200 mg via INTRAVENOUS

## 2023-05-12 NOTE — Patient Instructions (Signed)

## 2023-05-13 ENCOUNTER — Ambulatory Visit

## 2023-05-13 ENCOUNTER — Ambulatory Visit: Admitting: Anesthesiology

## 2023-05-13 ENCOUNTER — Other Ambulatory Visit: Payer: Self-pay

## 2023-05-13 ENCOUNTER — Encounter: Payer: Self-pay | Admitting: Gastroenterology

## 2023-05-13 ENCOUNTER — Ambulatory Visit
Admission: RE | Admit: 2023-05-13 | Discharge: 2023-05-13 | Disposition: A | Discharge status: Home / Self Care | Source: Ambulatory Visit | Attending: Gastroenterology | Admitting: Gastroenterology

## 2023-05-13 ENCOUNTER — Other Ambulatory Visit

## 2023-05-13 ENCOUNTER — Encounter
Admission: RE | Disposition: A | Discharge status: Home / Self Care | Payer: Self-pay | Source: Ambulatory Visit | Attending: Gastroenterology

## 2023-05-13 DIAGNOSIS — I2510 Atherosclerotic heart disease of native coronary artery without angina pectoris: Secondary | ICD-10-CM | POA: Insufficient documentation

## 2023-05-13 DIAGNOSIS — I110 Hypertensive heart disease with heart failure: Secondary | ICD-10-CM | POA: Diagnosis not present

## 2023-05-13 DIAGNOSIS — G4730 Sleep apnea, unspecified: Secondary | ICD-10-CM | POA: Diagnosis not present

## 2023-05-13 DIAGNOSIS — I509 Heart failure, unspecified: Secondary | ICD-10-CM | POA: Diagnosis not present

## 2023-05-13 DIAGNOSIS — K766 Portal hypertension: Secondary | ICD-10-CM | POA: Insufficient documentation

## 2023-05-13 DIAGNOSIS — I851 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 bleeding: Secondary | ICD-10-CM | POA: Insufficient documentation

## 2023-05-13 DIAGNOSIS — E611 Iron deficiency: Secondary | ICD-10-CM

## 2023-05-13 DIAGNOSIS — K921 Melena: Secondary | ICD-10-CM | POA: Insufficient documentation

## 2023-05-13 DIAGNOSIS — K920 Hematemesis: Secondary | ICD-10-CM

## 2023-05-13 DIAGNOSIS — K760 Fatty (change of) liver, not elsewhere classified: Secondary | ICD-10-CM | POA: Diagnosis not present

## 2023-05-13 DIAGNOSIS — K219 Gastro-esophageal reflux disease without esophagitis: Secondary | ICD-10-CM | POA: Diagnosis not present

## 2023-05-13 DIAGNOSIS — E119 Type 2 diabetes mellitus without complications: Secondary | ICD-10-CM | POA: Insufficient documentation

## 2023-05-13 DIAGNOSIS — K3189 Other diseases of stomach and duodenum: Secondary | ICD-10-CM | POA: Diagnosis not present

## 2023-05-13 HISTORY — PX: ESOPHAGOGASTRODUODENOSCOPY: SHX5428

## 2023-05-13 HISTORY — PX: ESOPHAGEAL BANDING: SHX5518

## 2023-05-13 LAB — GLUCOSE, CAPILLARY: Glucose-Capillary: 71 mg/dL (ref 70–99)

## 2023-05-13 SURGERY — EGD (ESOPHAGOGASTRODUODENOSCOPY)
Anesthesia: General

## 2023-05-13 MED ORDER — PROPOFOL 10 MG/ML IV BOLUS
INTRAVENOUS | Status: AC
  Filled 2023-05-13: qty 20

## 2023-05-13 MED ORDER — FENTANYL CITRATE (PF) 100 MCG/2ML IJ SOLN
25.0000 ug | Freq: Once | INTRAMUSCULAR | Status: AC
  Administered 2023-05-13: 25 ug via INTRAVENOUS
  Filled 2023-05-13: qty 0.5

## 2023-05-13 MED ORDER — ACETAMINOPHEN 160 MG/5ML PO SUSP
500.0000 mg | Freq: Once | ORAL | Status: AC
Start: 2023-05-13 — End: 2023-05-13
  Administered 2023-05-13: 500 mg via ORAL

## 2023-05-13 MED ORDER — LIDOCAINE HCL (CARDIAC) PF 100 MG/5ML IV SOSY
PREFILLED_SYRINGE | INTRAVENOUS | Status: DC | PRN
  Administered 2023-05-13: 50 mg via INTRAVENOUS

## 2023-05-13 MED ORDER — PROPOFOL 10 MG/ML IV BOLUS
INTRAVENOUS | Status: DC | PRN
Start: 2023-05-13 — End: 2023-05-13
  Administered 2023-05-13: 50 mg via INTRAVENOUS

## 2023-05-13 MED ORDER — ONDANSETRON HCL 4 MG/2ML IJ SOLN
INTRAMUSCULAR | Status: AC
  Filled 2023-05-13: qty 2

## 2023-05-13 MED ORDER — ONDANSETRON HCL 4 MG/2ML IJ SOLN
4.0000 mg | Freq: Once | INTRAMUSCULAR | Status: AC
  Administered 2023-05-13: 4 mg via INTRAVENOUS

## 2023-05-13 MED ORDER — FENTANYL CITRATE PF 50 MCG/ML IJ SOSY
PREFILLED_SYRINGE | INTRAMUSCULAR | Status: AC
  Filled 2023-05-13: qty 1

## 2023-05-13 MED ORDER — PROPOFOL 500 MG/50ML IV EMUL
INTRAVENOUS | Status: DC | PRN
  Administered 2023-05-13: 100 ug/kg/min via INTRAVENOUS

## 2023-05-13 MED ORDER — SODIUM CHLORIDE 0.9 % IV SOLN: INTRAVENOUS | Status: DC

## 2023-05-13 MED ORDER — ACETAMINOPHEN 160 MG/5ML PO SUSP
ORAL | Status: AC
  Filled 2023-05-13: qty 20

## 2023-05-13 NOTE — Progress Notes (Signed)
 Nausea better, but pain continued in abd. Dr. Karlton Lemon notified and 25 mcg fentanyl adm. IV. Patient says she had to have this last time she had varices banding.

## 2023-05-13 NOTE — H&P (Signed)
 Annette Minium, MD [facility] [address], [address] [place], [place] [address] Phone:[phone] Fax : [phone]  Primary Care Physician:  Jaclyn Shaggy, MD Primary Gastroenterologist:  Dr. Servando Snare  Pre-Procedure History & Physical: HPI:  Annette Foster is a 83 y.o. female is here for an endoscopy.   Past Medical History:  Diagnosis Date   Abdominal pain    Allergy    Cancer (HCC) 2017   breast cancer- Left   Cataract    CHF (congestive heart failure) (HCC)    Collagenous colitis    Coronary artery disease    Diabetes mellitus without complication (HCC)    Diarrhea    Diverticulosis    Dysrhythmia    Fatty liver disease, nonalcoholic    Fibrocystic breast    GERD (gastroesophageal reflux disease)    Heart murmur    Hyperlipidemia    Hypertension    Hypothyroidism    IBS (irritable bowel syndrome)    IDA (iron deficiency anemia)    Liver cirrhosis (HCC)    Personal history of radiation therapy 2017   LEFT BREAST CA   PONV (postoperative nausea and vomiting)    Presence of permanent cardiac pacemaker    Sleep apnea    C-Pap    Past Surgical History:  Procedure Laterality Date   ABDOMINAL HYSTERECTOMY     [place] bso   ABDOMINAL SURGERY     APPENDECTOMY     AV NODE ABLATION N/A 05/17/2021   Procedure: AV NODE ABLATION;  Surgeon: Lanier Prude, MD;  [facility];  Service: Cardiovascular;  Laterality: N/A;   BREAST BIOPSY Bilateral 2016   negative   BREAST 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/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, papillary carcinoma in situ   BREAST BIOPSY Left 05/27/2016   BENIGN MAMMARY EPITHELIUM   BREAST BIOPSY Left 11/20/2017   affirm bx x clip BENIGN MAMMARY EPITHELIUM CONSISTENT WITH RAD THERAPY   BREAST EXCISIONAL BIOPSY Right    NEG 1980's   BREAST LUMPECTOMY Left 10/17/2015   DCIS and papillary carcinoma insitu, clear margins   CARDIAC SURGERY     has replacement valve   CATARACT EXTRACTION Right    CATARACT EXTRACTION W/PHACO Left 01/16/2021    Procedure: CATARACT EXTRACTION PHACO AND INTRAOCULAR LENS PLACEMENT (IOC) LEFT DIABETIC 8.46 00:59.8;  Surgeon: Galen Manila, MD;  Location: [facility];  Service: Ophthalmology;  Laterality: Left;   CHOLECYSTECTOMY     COLONOSCOPY WITH PROPOFOL N/A 03/11/2016   Procedure: COLONOSCOPY WITH PROPOFOL;  Surgeon: Scot Jun, MD;  Location: [facility] ENDOSCOPY;  Service: Endoscopy;  Laterality: N/A;   COLONOSCOPY WITH PROPOFOL N/A 02/18/2020   Procedure: COLONOSCOPY WITH PROPOFOL;  Surgeon: Annette Minium, MD;  Location: [facility] ENDOSCOPY;  Service: Endoscopy;  Laterality: N/A;   COLONOSCOPY WITH PROPOFOL N/A 09/10/2022   Procedure: COLONOSCOPY WITH PROPOFOL;  Surgeon: Wyline Mood, MD;  Location: [facility] ENDOSCOPY;  Service: Gastroenterology;  Laterality: N/A;   ESOPHAGEAL BANDING  09/10/2022   Procedure: ESOPHAGEAL BANDING;  Surgeon: Wyline Mood, MD;  Location: [facility] ENDOSCOPY;  Service: Gastroenterology;;   ESOPHAGEAL BANDING  10/17/2022   Procedure: ESOPHAGEAL BANDING;  Surgeon: Annette Minium, MD;  Location: [facility] ENDOSCOPY;  Service: Endoscopy;;   ESOPHAGEAL BANDING  12/24/2022   Procedure: ESOPHAGEAL BANDING;  Surgeon: Annette Minium, MD;  Location: [facility] ENDOSCOPY;  Service: Endoscopy;;   ESOPHAGOGASTRODUODENOSCOPY (EGD) WITH PROPOFOL N/A 03/11/2016   Procedure: ESOPHAGOGASTRODUODENOSCOPY (EGD) WITH PROPOFOL;  Surgeon: Scot Jun, MD;  Location: [facility] ENDOSCOPY;  Service: Endoscopy;  Laterality: N/A;  ESOPHAGOGASTRODUODENOSCOPY (EGD) WITH PROPOFOL N/A 02/18/2020   Procedure: ESOPHAGOGASTRODUODENOSCOPY (EGD) WITH PROPOFOL;  Surgeon: Annette Minium, MD;  Location: [facility] ENDOSCOPY;  Service: Endoscopy;  Laterality: N/A;   ESOPHAGOGASTRODUODENOSCOPY (EGD) WITH PROPOFOL N/A 04/25/2020   Procedure: ESOPHAGOGASTRODUODENOSCOPY (EGD) WITH PROPOFOL;  Surgeon: Annette Minium, MD;  Location: [facility] ENDOSCOPY;  Service: Endoscopy;  Laterality: N/A;   ESOPHAGOGASTRODUODENOSCOPY (EGD) WITH PROPOFOL N/A 05/23/2020    Procedure: ESOPHAGOGASTRODUODENOSCOPY (EGD) WITH PROPOFOL;  Surgeon: Annette Minium, MD;  Location: [facility] ENDOSCOPY;  Service: Endoscopy;  Laterality: N/A;   ESOPHAGOGASTRODUODENOSCOPY (EGD) WITH PROPOFOL N/A 09/10/2022   Procedure: ESOPHAGOGASTRODUODENOSCOPY (EGD) WITH PROPOFOL;  Surgeon: Wyline Mood, MD;  Location: [facility] ENDOSCOPY;  Service: Gastroenterology;  Laterality: N/A;   ESOPHAGOGASTRODUODENOSCOPY (EGD) WITH PROPOFOL N/A 10/17/2022   Procedure: ESOPHAGOGASTRODUODENOSCOPY (EGD) WITH PROPOFOL;  Surgeon: Annette Minium, MD;  Location: [facility] ENDOSCOPY;  Service: Endoscopy;  Laterality: N/A;   ESOPHAGOGASTRODUODENOSCOPY (EGD) WITH PROPOFOL N/A 12/24/2022   Procedure: ESOPHAGOGASTRODUODENOSCOPY (EGD) WITH PROPOFOL;  Surgeon: Annette Minium, MD;  Location: [facility] ENDOSCOPY;  Service: Endoscopy;  Laterality: N/A;   ESOPHAGOGASTRODUODENOSCOPY (EGD) WITH PROPOFOL N/A 01/29/2023   Procedure: ESOPHAGOGASTRODUODENOSCOPY (EGD) WITH PROPOFOL;  Surgeon: Regis Bill, MD;  Location: [facility] ENDOSCOPY;  Service: Endoscopy;  Laterality: N/A;  Ideally we would intubate if possible given concern for varices   EUS N/A 10/04/2021   Procedure: LOWER ENDOSCOPIC ULTRASOUND (EUS);  Surgeon: Doren Custard, MD;  Location: [facility] ENDOSCOPY;  Service: Gastroenterology;  Laterality: N/A;  [facility]   EYE SURGERY     FINGER ARTHROSCOPY WITH CARPOMETACARPEL ([facility]) ARTHROPLASTY Right 09/03/2018   Procedure: CARPOMETACARPEL [facility]) ARTHROPLASTY RIGHT THUMB;  Surgeon: Kennedy Bucker, MD;  Location: [facility] ORS;  Service: Orthopedics;  Laterality: Right;   FLEXIBLE SIGMOIDOSCOPY N/A 09/14/2021   Procedure: FLEXIBLE SIGMOIDOSCOPY;  Surgeon: Annette Minium, MD;  Location: [facility] ENDOSCOPY;  Service: Endoscopy;  Laterality: N/A;  No anesthesia   GANGLION CYST EXCISION Right 09/03/2018   Procedure: REMOVAL GANGLION OF WRIST;  Surgeon: Kennedy Bucker, MD;  Location: [facility] ORS;  Service: Orthopedics;  Laterality: Right;   HARDWARE REMOVAL Right  09/03/2018   Procedure: HARDWARE REMOVAL RIGHT THUMB;  Surgeon: Kennedy Bucker, MD;  Location: [facility] ORS;  Service: Orthopedics;  Laterality: Right;  staple removed   HEMOSTASIS CLIP PLACEMENT  09/10/2022   Procedure: HEMOSTASIS CLIP PLACEMENT;  Surgeon: Wyline Mood, MD;  Location: [facility] ENDOSCOPY;  Service: Gastroenterology;;   HEMOSTASIS CONTROL  09/10/2022   Procedure: HEMOSTASIS CONTROL;  Surgeon: Wyline Mood, MD;  Location: [facility] ENDOSCOPY;  Service: Gastroenterology;;   HEMOSTASIS CONTROL  01/29/2023   Procedure: HEMOSTASIS CONTROL;  Surgeon: Regis Bill, MD;  Location: [facility] ENDOSCOPY;  Service: Endoscopy;;   JOINT REPLACEMENT Left    TKR   left sinusplasty      MASTECTOMY, PARTIAL Left 10/17/2015   Procedure: MASTECTOMY PARTIAL REVISION;  Surgeon: Nadeen Landau, MD;  Location: [facility] ORS;  Service: General;  Laterality: Left;   PACEMAKER IMPLANT N/A 03/23/2021   Procedure: PACEMAKER IMPLANT;  Surgeon: Lanier Prude, MD;  [facility]: [facility]VASIVE CV LAB;  Service: Cardiovascular;  Laterality: N/A;   PARTIAL MASTECTOMY WITH NEEDLE LOCALIZATION Left 09/29/2015   Procedure: PARTIAL MASTECTOMY WITH NEEDLE LOCALIZATION;  Surgeon: Nadeen Landau, MD;  Location: [facility] ORS;  Service: General;  Laterality: Left;   POLYPECTOMY  09/10/2022   Procedure: POLYPECTOMY;  Surgeon: Wyline Mood, MD;  Location: [facility] ENDOSCOPY;  Service: Gastroenterology;;   RECTAL EXAM UNDER ANESTHESIA N/A 10/05/2021   Procedure: RECTAL EXAM UNDER ANESTHESIA, ASPIRATION OF RECTAL CYST;  Surgeon: Carolan Shiver, MD;  Location: [facility] ORS;  Service: General;  Laterality: N/A;   TOTAL ABDOMINAL HYSTERECTOMY W/ BILATERAL SALPINGOOPHORECTOMY      Prior to Admission medications   Medication Sig Start Date End Date Taking? Authorizing Provider  Azelastine HCl 137 MCG/SPRAY SOLN INHALE 1-2 PUFFS IN EACH NOSTRIL NASALLY ONCE A DAY 30 DAY(S)   Yes [provider]  benzonatate (TESSALON) 100 MG capsule  Take 100 mg by mouth 3 (three) times daily as needed. 10/28/22  Yes [provider]  Cholecalciferol (VITAMIN D) 50 MCG (2000 UT) CAPS Take 2,000 Units by mouth daily.   Yes [provider]  cyanocobalamin (,VITAMIN B-12,) 1000 MCG/ML injection 1,000 mcg every 30 (thirty) days. 04/23/19  Yes [provider]  dicyclomine (BENTYL) 10 MG capsule Take 10 mg by mouth in the morning, at noon, and at bedtime. 05/16/19  Yes [provider]  esomeprazole (NEXIUM) 40 MG capsule Take 40 mg by mouth 2 (two) times daily before a meal.  02/08/16  Yes [provider]  fenofibrate 160 MG tablet Take 160 mg by mouth at bedtime.   Yes [provider]  gabapentin (NEURONTIN) 300 MG capsule Take 300 mg by mouth 2 (two) times daily. 09/07/16 10/25/36 Yes Jaclyn Shaggy, MD  insulin degludec (TRESIBA FLEXTOUCH) 200 UNIT/ML FlexTouch Pen 200 Units at bedtime.   Yes [provider]  levothyroxine (SYNTHROID, LEVOTHROID) 75 MCG tablet Take 75 mcg by mouth daily before breakfast.    Yes [provider]  magnesium oxide (MAG-OX) 400 MG tablet Take 400 mg by mouth 2 (two) times daily.    Yes [provider]  MOUNJARO 15 MG/0.5ML Pen Inject 15 mg into the skin once a week. 03/18/23  Yes [provider]  nadolol (CORGARD) 20 MG tablet Take 1 tablet (20 mg total) by mouth daily. 12/20/22  Yes End, Cristal Deer, MD  potassium chloride SA (K-DUR,KLOR-CON) 20 MEQ tablet Take 20 mEq by mouth 2 (two) times daily.    Yes [provider]  simvastatin (ZOCOR) 20 MG tablet Take 20 mg by mouth at bedtime.   Yes [provider]  spironolactone (ALDACTONE) 25 MG tablet TAKE 1/2 TABLET BY MOUTH DAILY 05/07/23  Yes End, Cristal Deer, MD  torsemide (DEMADEX) 20 MG tablet Take 20 mg by mouth daily.   Yes [provider]  valACYclovir (VALTREX) 500 MG tablet Take 500 mg by mouth 2 (two) times daily as needed. 02/10/23  Yes [provider]  clobetasol ointment (TEMOVATE) 0.05 % Apply 1 application topically as needed (vaginal irritation).    [provider]  clotrimazole (LOTRIMIN) 1 % cream Apply 1 Application topically 2 (two) times daily as needed.    [provider]  docusate sodium (COLACE) 100 MG capsule Take 100 mg by mouth 3 (three) times daily as needed for moderate constipation. 01/10/20   [provider]  Insulin Aspart (NOVOLOG FLEXPEN [place]) Inject into the skin. Sliding scale    [provider]  levocetirizine (XYZAL) 5 MG tablet Take 5 mg by mouth every evening.    [provider]  NONFORMULARY OR COMPOUNDED ITEM Nifedipine 0.3% plus lidocaine 2% cream apply to rectum BID and after every BM.    [provider]  saccharomyces boulardii (FLORASTOR) 250 MG capsule Take 250 mg by mouth 2 (two) times daily.     [provider]    Allergies as of 05/07/2023 - Review Complete 05/07/2023  Allergen Reaction Noted   Codeine  Itching, Nausea And Vomiting, and Other (See Comments) 06/28/2014   Demeclocycline Rash 04/10/2012   Demerol [meperidine] Itching and Nausea And Vomiting 06/28/2014   Hydrocodone Itching and Nausea And Vomiting 08/24/2018   Morphine Nausea Only 03/03/2023   Other Other (See Comments) 04/10/2012   Oxycodone Itching and Nausea And Vomiting 09/26/2015   Pentazocine Itching, Nausea And Vomiting, and Other (See Comments) 04/10/2012   Tetracyclines & related Rash 06/28/2014   Coal tar extract Other (See Comments) 03/19/2022   Hydrocodone-acetaminophen Other (See Comments) 01/30/2018   Salicylic acid Other (See Comments) 04/02/2022   Tetracycline hcl Other (See Comments) 04/02/2022    Family History  Problem Relation Age of Onset   Breast cancer Paternal Grandmother    Colon cancer Father    Diabetes Sister    Diabetes Brother    Heart disease Brother    Prostate cancer Brother    Colon cancer Maternal Uncle    Prostate cancer Brother     Bladder Cancer Brother    Leukemia Mother        all   Ovarian cancer Neg Hx    Kidney cancer Neg Hx     Social History   Socioeconomic History   Marital status: Married    Spouse name: Chase Picket   Number of children: Not on file   Years of education: Not on file   Highest education level: Not on file  Occupational History   Not on file  Tobacco Use   Smoking status: Never   Smokeless tobacco: Never  Vaping Use   Vaping status: Never Used  Substance and Sexual Activity   Alcohol use: Never   Drug use: Never   Sexual activity: Not Currently    Birth control/protection: Surgical  Other Topics Concern   Not on file  Social History Narrative    Husband lives at nursing home , lives with husbands sister   Social Drivers of Health   Financial Resource Strain: Medium Risk (05/02/2023)   Received from [facility]   Overall Financial Resource Strain (CARDIA)    Difficulty of Paying Living Expenses: Somewhat hard  Food Insecurity: No Food Insecurity (05/02/2023)   Received from [facility]   Hunger Vital Sign    Worried About Running Out of Food in the Last Year: Never true    Ran Out of Food in the Last Year: Never true  Transportation Needs: No Transportation Needs (05/02/2023)   Received from [facility] - Transportation    In the past 12 months, has lack of transportation kept you from medical appointments or from getting medications?: No    Lack of Transportation (Non-Medical): No  Physical Activity: Unknown (01/30/2018)   Received from [facility]   Exercise Vital Sign    Days of Exercise per Week: 7 days    Minutes of Exercise per Session: Not on file  Stress: Not on file  Social Connections: Not on file  Intimate Partner Violence: Not At Risk (01/29/2023)   Humiliation, Afraid, Rape, and Kick questionnaire    Fear of Current or Ex-Partner: No    Emotionally Abused: No     Physically Abused: No    Sexually Abused: No    Review of Systems: See HPI, otherwise negative ROS  Physical Exam: BP (!) 119/51   Pulse 81   Temp (!) 97.3 F (36.3 C)   Wt 60.3 kg   LMP  (LMP Unknown)  SpO2 100%   BMI 23.56 kg/m  General:   Alert,  pleasant and cooperative in NAD Head:  Normocephalic and atraumatic. Neck:  Supple; no masses or thyromegaly. Lungs:  Clear throughout to auscultation.    Heart:  Regular rate and rhythm. Abdomen:  Soft, nontender and nondistended. Normal bowel sounds, without guarding, and without rebound.   Neurologic:  Alert and  oriented x4;  grossly normal neurologically.  Impression/Plan: Annette Foster is here for an endoscopy to be performed for melena  Risks, benefits, limitations, and alternatives regarding  endoscopy have been reviewed with the patient.  Questions have been answered.  All parties agreeable.   Annette Minium, MD  05/13/2023, 11:10 AM

## 2023-05-13 NOTE — Anesthesia Postprocedure Evaluation (Signed)
 Anesthesia Post Note  Patient: Annette Foster  Procedure(s) Performed: EGD (ESOPHAGOGASTRODUODENOSCOPY) ESOPHAGOSCOPY, WITH ESOPHAGEAL VARICES BAND LIGATION  Patient location during evaluation: Endoscopy Anesthesia Type: General Level of consciousness: awake and alert Pain management: pain level controlled Vital Signs Assessment: post-procedure vital signs reviewed and stable Respiratory status: spontaneous breathing, nonlabored ventilation, respiratory function stable and patient connected to nasal cannula oxygen Cardiovascular status: blood pressure returned to baseline and stable Postop Assessment: no apparent nausea or vomiting Anesthetic complications: no   No notable events documented.   Last Vitals:  Vitals:   05/13/23 1229 05/13/23 1339  BP: (!) 128/51 120/68  Pulse: 79   Resp: 16   Temp:    SpO2: 100% 99%    Last Pain:  Vitals:   05/13/23 1339  TempSrc:   PainSc: 4                  Lenard Simmer

## 2023-05-13 NOTE — Anesthesia Preprocedure Evaluation (Signed)
 Anesthesia Evaluation  Patient identified by MRN, date of birth, ID band Patient awake    Reviewed: Allergy & Precautions, NPO status , Patient's Chart, lab work & pertinent test results  History of Anesthesia Complications (+) PONV and history of anesthetic complications  Airway Mallampati: III  TM Distance: <3 FB Neck ROM: full    Dental  (+) Chipped, Poor Dentition, Missing, Partial Lower, Upper Dentures, Dental Advidsory Given   Pulmonary shortness of breath, sleep apnea , pneumonia, unresolved, neg COPD, neg recent URI   Pulmonary exam normal        Cardiovascular hypertension, (-) angina + CAD and +CHF  (-) Cardiac Stents + dysrhythmias Atrial Fibrillation + pacemaker + Valvular Problems/Murmurs      Neuro/Psych negative neurological ROS  negative psych ROS   GI/Hepatic ,GERD  Controlled,,(+) Hepatitis -Hx of esophageal varices   Endo/Other  diabetesHypothyroidism    Renal/GU Renal disease     Musculoskeletal   Abdominal   Peds  Hematology  (+) Blood dyscrasia, anemia   Anesthesia Other Findings Past Medical History: No date: Abdominal pain No date: Allergy 2017: Cancer (HCC)     Comment:  breast cancer- Left No date: Cataract No date: CHF (congestive heart failure) (HCC) No date: Collagenous colitis No date: Coronary artery disease No date: Diabetes mellitus without complication (HCC) No date: Diarrhea No date: Diverticulosis No date: Dysrhythmia No date: Fatty liver disease, nonalcoholic No date: Fibrocystic breast No date: GERD (gastroesophageal reflux disease) No date: Heart murmur No date: Hyperlipidemia No date: Hypertension No date: Hypothyroidism No date: IBS (irritable bowel syndrome) No date: IDA (iron deficiency anemia) No date: Liver cirrhosis (HCC) 2017: Personal history of radiation therapy     Comment:  LEFT BREAST CA No date: PONV (postoperative nausea and vomiting) No date:  Presence of permanent cardiac pacemaker No date: Sleep apnea     Comment:  C-Pap  Past Surgical History: No date: ABDOMINAL HYSTERECTOMY     Comment:  [place] No date: ABDOMINAL SURGERY No date: APPENDECTOMY 05/17/2021: AV NODE ABLATION; N/A     Comment:  Procedure: AV NODE ABLATION;  Surgeon: Lanier Prude, MD;  Location: [facility];  Service:               Cardiovascular;  Laterality: N/A; 2016: BREAST BIOPSY; Bilateral     Comment:  negative 09/11/2015: BREAST BIOPSY; Left     Comment:  DCIS, papillary carcinoma in situ 05/27/2016: BREAST BIOPSY; Left     Comment:  BENIGN MAMMARY EPITHELIUM 11/20/2017: BREAST BIOPSY; Left     Comment:  affirm bx x clip BENIGN MAMMARY EPITHELIUM CONSISTENT               WITH RAD THERAPY No date: BREAST EXCISIONAL BIOPSY; Right     Comment:  NEG 1980's 10/17/2015: BREAST LUMPECTOMY; Left     Comment:  DCIS and papillary carcinoma insitu, clear margins No date: CARDIAC SURGERY     Comment:  has replacement valve No date: CATARACT EXTRACTION; Right 01/16/2021: CATARACT EXTRACTION W/PHACO; Left     Comment:  Procedure: CATARACT EXTRACTION PHACO AND INTRAOCULAR               LENS PLACEMENT (IOC) LEFT DIABETIC 8.46 00:59.8;                Surgeon: Galen Manila, MD;  Location: [facility]  [facility];  Service: Ophthalmology;  Laterality: Left; No date: CHOLECYSTECTOMY 03/11/2016: COLONOSCOPY WITH PROPOFOL; N/A     Comment:  Procedure: COLONOSCOPY WITH PROPOFOL;  Surgeon: Scot Jun, MD;  Location: [facility] ENDOSCOPY;  Service:               Endoscopy;  Laterality: N/A; 02/18/2020: COLONOSCOPY WITH PROPOFOL; N/A     Comment:  Procedure: COLONOSCOPY WITH PROPOFOL;  Surgeon: Midge Minium, MD;  Location: [facility] ENDOSCOPY;  Service:               Endoscopy;  Laterality: N/A; 09/10/2022: COLONOSCOPY WITH PROPOFOL; N/A     Comment:  Procedure: COLONOSCOPY WITH PROPOFOL;  Surgeon:  Wyline Mood, MD;  Location: [facility] ENDOSCOPY;  Service:               Gastroenterology;  Laterality: N/A; 09/10/2022: ESOPHAGEAL BANDING     Comment:  Procedure: ESOPHAGEAL BANDING;  Surgeon: Wyline Mood,               MD;  Location: [facility] ENDOSCOPY;  Service:               Gastroenterology;; 03/11/2016: ESOPHAGOGASTRODUODENOSCOPY (EGD) WITH PROPOFOL; N/A     Comment:  Procedure: ESOPHAGOGASTRODUODENOSCOPY (EGD) WITH               PROPOFOL;  Surgeon: Scot Jun, MD;  Location: [facility]              ENDOSCOPY;  Service: Endoscopy;  Laterality: N/A; 02/18/2020: ESOPHAGOGASTRODUODENOSCOPY (EGD) WITH PROPOFOL; N/A     Comment:  Procedure: ESOPHAGOGASTRODUODENOSCOPY (EGD) WITH               PROPOFOL;  Surgeon: Midge Minium, MD;  Location: [facility]               ENDOSCOPY;  Service: Endoscopy;  Laterality: N/A; 04/25/2020: ESOPHAGOGASTRODUODENOSCOPY (EGD) WITH PROPOFOL; N/A     Comment:  Procedure: ESOPHAGOGASTRODUODENOSCOPY (EGD) WITH               PROPOFOL;  Surgeon: Midge Minium, MD;  Location: [facility]               ENDOSCOPY;  Service: Endoscopy;  Laterality: N/A; 05/23/2020: ESOPHAGOGASTRODUODENOSCOPY (EGD) WITH PROPOFOL; N/A     Comment:  Procedure: ESOPHAGOGASTRODUODENOSCOPY (EGD) WITH               PROPOFOL;  Surgeon: Midge Minium, MD;  Location: [facility]               ENDOSCOPY;  Service: Endoscopy;  Laterality: N/A; 09/10/2022: ESOPHAGOGASTRODUODENOSCOPY (EGD) WITH PROPOFOL; N/A     Comment:  Procedure: ESOPHAGOGASTRODUODENOSCOPY (EGD) WITH               PROPOFOL;  Surgeon: Wyline Mood, MD;  Location: [facility]               ENDOSCOPY;  Service: Gastroenterology;  Laterality: N/A; 10/04/2021: EUS; N/A     Comment:  Procedure: LOWER ENDOSCOPIC ULTRASOUND (EUS);  Surgeon:               Doren Custard, MD;  Location: [facility] ENDOSCOPY;                Service: Gastroenterology;  Laterality: N/A;  [facility] No date:  EYE SURGERY 09/03/2018: FINGER ARTHROSCOPY WITH CARPOMETACARPEL ([facility])   ARTHROPLASTY; Right     Comment:  Procedure: CARPOMETACARPEL [facility]) ARTHROPLASTY RIGHT               THUMB;  Surgeon: Kennedy Bucker, MD;  Location: [facility] ORS;               Service: Orthopedics;  Laterality: Right; 09/14/2021: FLEXIBLE SIGMOIDOSCOPY; N/A     Comment:  Procedure: FLEXIBLE SIGMOIDOSCOPY;  Surgeon: Midge Minium, MD;  Location: [facility] ENDOSCOPY;  Service:               Endoscopy;  Laterality: N/A;  No anesthesia 09/03/2018: GANGLION CYST EXCISION; Right     Comment:  Procedure: REMOVAL GANGLION OF WRIST;  Surgeon: Kennedy Bucker, MD;  Location: [facility] ORS;  Service: Orthopedics;               Laterality: Right; 09/03/2018: HARDWARE REMOVAL; Right     Comment:  Procedure: HARDWARE REMOVAL RIGHT THUMB;  Surgeon: Kennedy Bucker, MD;  Location: [facility] ORS;  Service: Orthopedics;               Laterality: Right;  staple removed 09/10/2022: HEMOSTASIS CLIP PLACEMENT     Comment:  Procedure: HEMOSTASIS CLIP PLACEMENT;  Surgeon: Wyline Mood, MD;  Location: [facility] ENDOSCOPY;  Service:               Gastroenterology;; 09/10/2022: HEMOSTASIS CONTROL     Comment:  Procedure: HEMOSTASIS CONTROL;  Surgeon: Wyline Mood,               MD;  Location: [facility] ENDOSCOPY;  Service:               Gastroenterology;; No date: JOINT REPLACEMENT; Left     Comment:  TKR No date: left sinusplasty  10/17/2015: MASTECTOMY, PARTIAL; Left     Comment:  Procedure: MASTECTOMY PARTIAL REVISION;  Surgeon: Nadeen Landau, MD;  Location: [facility] ORS;  Service: General;              Laterality: Left; 03/23/2021: PACEMAKER IMPLANT; N/A     Comment:  Procedure: PACEMAKER IMPLANT;  Surgeon: Lanier Prude, MD;  Location: [facility];  Service:               Cardiovascular;  Laterality: N/A; 09/29/2015: PARTIAL MASTECTOMY WITH NEEDLE LOCALIZATION; Left     Comment:  Procedure: PARTIAL MASTECTOMY WITH NEEDLE LOCALIZATION;                Surgeon: Nadeen Landau, MD;  Location: [facility] ORS;                Service: General;  Laterality: Left; 09/10/2022: POLYPECTOMY     Comment:  Procedure: POLYPECTOMY;  Surgeon: Wyline Mood, MD;                Location: [facility] ENDOSCOPY;  Service: Gastroenterology;; 10/05/2021: RECTAL EXAM UNDER ANESTHESIA; N/A     Comment:  Procedure: RECTAL EXAM UNDER ANESTHESIA, ASPIRATION OF  RECTAL CYST;  Surgeon: Carolan Shiver, MD;                Location: [facility] ORS;  Service: General;  Laterality: N/A; No date: TOTAL ABDOMINAL HYSTERECTOMY W/ BILATERAL SALPINGOOPHORECTOMY  BMI    Body Mass Index: 25.15 kg/m      Reproductive/Obstetrics negative OB ROS                             Anesthesia Physical Anesthesia Plan  ASA: 3  Anesthesia Plan: General   Post-op Pain Management: Minimal or no pain anticipated   Induction: Intravenous  PONV Risk Score and Plan: 4 or greater and Propofol infusion and TIVA  Airway Management Planned: Nasal Cannula and Natural Airway  Additional Equipment: None  Intra-op Plan:   Post-operative Plan:   Informed Consent: I have reviewed the patients History and Physical, chart, labs and discussed the procedure including the risks, benefits and alternatives for the proposed anesthesia with the patient or authorized representative who has indicated his/her understanding and acceptance.     Dental advisory given  Plan Discussed with: CRNA and Surgeon  Anesthesia Plan Comments: (Discussed risks of anesthesia with patient, including possibility of difficulty with spontaneous ventilation under anesthesia necessitating airway intervention, PONV, and rare risks such as cardiac or respiratory or neurological events, and allergic reactions. Discussed the role of CRNA in patient's perioperative care. Patient understands.)       Anesthesia Quick Evaluation

## 2023-05-13 NOTE — Op Note (Signed)
 [facility] Gastroenterology Patient Name: Annette Foster Procedure Date: 05/13/2023 11:05 AM MRN: [id number] Account #: [id number] Date of Birth: 06-29-1940 Admit Type: Outpatient Age: 83 Room: [facility] ENDO ROOM 4 Gender: Female Note Status: Finalized Instrument Name: Patton Salles Endoscope [id number] Procedure:             Upper GI endoscopy Indications:           Melena Providers:             Midge Minium MD, MD Referring MD:          Jillene Bucks. Arlana Pouch, MD (Referring MD) Medicines:             Propofol per Anesthesia Complications:         No immediate complications. Procedure:             Pre-Anesthesia Assessment:                        - Prior to the procedure, a History and Physical was                         performed, and patient medications and allergies were                         reviewed. The patient's tolerance of previous                         anesthesia was also reviewed. The risks and benefits                         of the procedure and the sedation options and risks                         were discussed with the patient. All questions were                         answered, and informed consent was obtained. Prior                         Anticoagulants: The patient has taken no anticoagulant                         or antiplatelet agents. ASA Grade Assessment: III - A                         patient with severe systemic disease. After reviewing                         the risks and benefits, the patient was deemed in                         satisfactory condition to undergo the procedure.                        After obtaining informed consent, the endoscope was                         passed under direct vision. Throughout the procedure,  the patient's blood pressure, pulse, and oxygen                         saturations were monitored continuously. The Endoscope                         was introduced through the mouth, and advanced to  the                         second part of duodenum. The upper GI endoscopy was                         accomplished without difficulty. The patient tolerated                         the procedure well. Findings:      Grade II varices were found in the lower third of the esophagus. Three       bands were successfully placed with incomplete eradication of varices.       There was no bleeding during the procedure.      Localized mildly erythematous mucosa without bleeding was found in the       stomach.      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      The examined duodenum was normal. Impression:            - Grade II esophageal varices. Incompletely                         eradicated. Banded.                        - Erythematous mucosa in the stomach.                        - Portal hypertensive gastropathy.                        - Normal examined duodenum.                        - No specimens collected. Recommendation:        - Discharge patient to home.                        - Resume previous diet.                        - Continue present medications. Procedure Code(s):     --- Professional ---                        [phone], Esophagogastroduodenoscopy, flexible,                         transoral; with band ligation of esophageal/gastric                         varices Diagnosis Code(s):     --- Professional ---                        K92.1, Melena (  includes Hematochezia)                        I85.00, Esophageal varices without bleeding CPT copyright 2022 [employer]. All rights reserved. The codes documented in this report are preliminary and upon coder review may  be revised to meet current compliance requirements. Midge Minium MD, MD 05/13/2023 11:30:01 AM This report has been signed electronically. Number of Addenda: 0 Note Initiated On: 05/13/2023 11:05 AM Estimated Blood Loss:  Estimated blood loss: none.      [facility]

## 2023-05-13 NOTE — Progress Notes (Signed)
 Noted that patient's abdomen is tight and distended, patient states "They are suppose to drain it next week". She said her abdomen was tight and distended like this on admission today.

## 2023-05-13 NOTE — Transfer of Care (Signed)
 Immediate Anesthesia Transfer of Care Note  Patient: Annette Foster  Procedure(s) Performed: EGD (ESOPHAGOGASTRODUODENOSCOPY) ESOPHAGOSCOPY, WITH ESOPHAGEAL VARICES BAND LIGATION  Patient Location: PACU and Endoscopy Unit  Anesthesia Type:General  Level of Consciousness: sedated  Airway & Oxygen Therapy: Patient Spontanous Breathing  Post-op Assessment: Report given to RN and Post -op Vital signs reviewed and stable  Post vital signs: Reviewed and stable  Last Vitals:  Vitals Value Taken Time  BP 138/79 05/13/23 1129  Temp    Pulse 79 05/13/23 1129  Resp 18 05/13/23 1129  SpO2 99 % 05/13/23 1129    Last Pain:  Vitals:   05/13/23 1030  PainSc: 0-No pain         Complications: No notable events documented.

## 2023-05-14 ENCOUNTER — Encounter: Payer: Self-pay | Admitting: Gastroenterology

## 2023-05-20 ENCOUNTER — Inpatient Hospital Stay

## 2023-05-20 ENCOUNTER — Inpatient Hospital Stay: Attending: Internal Medicine

## 2023-05-20 VITALS — BP 122/65 | HR 72 | Temp 97.3°F | Resp 16

## 2023-05-20 DIAGNOSIS — D500 Iron deficiency anemia secondary to blood loss (chronic): Secondary | ICD-10-CM | POA: Diagnosis present

## 2023-05-20 DIAGNOSIS — D61818 Other pancytopenia: Secondary | ICD-10-CM | POA: Diagnosis present

## 2023-05-20 DIAGNOSIS — Z87440 Personal history of urinary (tract) infections: Secondary | ICD-10-CM | POA: Diagnosis not present

## 2023-05-20 DIAGNOSIS — K921 Melena: Secondary | ICD-10-CM | POA: Insufficient documentation

## 2023-05-20 DIAGNOSIS — Z79899 Other long term (current) drug therapy: Secondary | ICD-10-CM | POA: Insufficient documentation

## 2023-05-20 DIAGNOSIS — K7581 Nonalcoholic steatohepatitis (NASH): Secondary | ICD-10-CM | POA: Insufficient documentation

## 2023-05-20 DIAGNOSIS — E611 Iron deficiency: Secondary | ICD-10-CM

## 2023-05-20 DIAGNOSIS — Z800 Family history of malignant neoplasm of digestive organs: Secondary | ICD-10-CM | POA: Diagnosis not present

## 2023-05-20 DIAGNOSIS — Z803 Family history of malignant neoplasm of breast: Secondary | ICD-10-CM | POA: Insufficient documentation

## 2023-05-20 DIAGNOSIS — K7460 Unspecified cirrhosis of liver: Secondary | ICD-10-CM | POA: Insufficient documentation

## 2023-05-20 DIAGNOSIS — Z86000 Personal history of in-situ neoplasm of breast: Secondary | ICD-10-CM | POA: Diagnosis not present

## 2023-05-20 DIAGNOSIS — K920 Hematemesis: Secondary | ICD-10-CM | POA: Insufficient documentation

## 2023-05-20 LAB — CBC WITH DIFFERENTIAL (CANCER CENTER ONLY)
Abs Immature Granulocytes: 0 10*3/uL (ref 0.00–0.07)
Basophils Absolute: 0 10*3/uL (ref 0.0–0.1)
Basophils Relative: 1 %
Eosinophils Absolute: 0.1 10*3/uL (ref 0.0–0.5)
Eosinophils Relative: 3 %
HCT: 31.3 % — ABNORMAL LOW (ref 36.0–46.0)
Hemoglobin: 10 g/dL — ABNORMAL LOW (ref 12.0–15.0)
Immature Granulocytes: 0 %
Lymphocytes Relative: 35 %
Lymphs Abs: 0.6 10*3/uL — ABNORMAL LOW (ref 0.7–4.0)
MCH: 31.4 pg (ref 26.0–34.0)
MCHC: 31.9 g/dL (ref 30.0–36.0)
MCV: 98.4 fL (ref 80.0–100.0)
Monocytes Absolute: 0.2 10*3/uL (ref 0.1–1.0)
Monocytes Relative: 13 %
Neutro Abs: 0.8 10*3/uL — ABNORMAL LOW (ref 1.7–7.7)
Neutrophils Relative %: 48 %
Platelet Count: 52 10*3/uL — ABNORMAL LOW (ref 150–400)
RBC: 3.18 MIL/uL — ABNORMAL LOW (ref 3.87–5.11)
RDW: 15.7 % — ABNORMAL HIGH (ref 11.5–15.5)
WBC Count: 1.6 10*3/uL — ABNORMAL LOW (ref 4.0–10.5)
nRBC: 0 % (ref 0.0–0.2)

## 2023-05-20 LAB — SAMPLE TO BLOOD BANK

## 2023-05-20 LAB — CUP PACEART INCLINIC DEVICE CHECK
Battery Remaining Percentage: 80 %
Brady Statistic RA Percent Paced: 81 %
Brady Statistic RV Percent Paced: 97 %
Date Time Interrogation Session: 20250326145235
Implantable Lead Connection Status: 753985
Implantable Lead Connection Status: 753985
Implantable Lead Implant Date: 20230210
Implantable Lead Implant Date: 20230210
Implantable Lead Location: 753859
Implantable Lead Location: 753860
Implantable Lead Model: 377169
Implantable Lead Model: 377171
Implantable Lead Serial Number: 7000400966
Implantable Lead Serial Number: 7000402075
Implantable Pulse Generator Implant Date: 20230210
Lead Channel Impedance Value: 487 Ohm
Lead Channel Impedance Value: 565 Ohm
Lead Channel Pacing Threshold Amplitude: 0.9 V
Lead Channel Pacing Threshold Amplitude: 1.2 V
Lead Channel Pacing Threshold Pulse Width: 0.4 ms
Lead Channel Pacing Threshold Pulse Width: 0.4 ms
Lead Channel Setting Pacing Amplitude: 1.8 V
Lead Channel Setting Pacing Amplitude: 1.9 V
Lead Channel Setting Pacing Pulse Width: 0.4 ms
Pulse Gen Model: 407145
Pulse Gen Serial Number: 70331369

## 2023-05-20 MED ORDER — IRON SUCROSE 20 MG/ML IV SOLN
200.0000 mg | Freq: Once | INTRAVENOUS | Status: AC
  Administered 2023-05-20: 200 mg via INTRAVENOUS
  Filled 2023-05-20: qty 10

## 2023-05-21 ENCOUNTER — Other Ambulatory Visit: Payer: Self-pay

## 2023-05-21 ENCOUNTER — Emergency Department

## 2023-05-21 ENCOUNTER — Observation Stay
Admission: EM | Admit: 2023-05-21 | Discharge: 2023-05-23 | Disposition: A | Discharge status: Home / Self Care | Attending: Hospitalist | Admitting: Hospitalist

## 2023-05-21 ENCOUNTER — Inpatient Hospital Stay (HOSPITAL_BASED_OUTPATIENT_CLINIC_OR_DEPARTMENT_OTHER): Admitting: Hospice and Palliative Medicine

## 2023-05-21 ENCOUNTER — Encounter: Payer: Self-pay | Admitting: Hospice and Palliative Medicine

## 2023-05-21 VITALS — BP 97/76 | HR 67 | Temp 96.3°F | Wt 159.1 lb

## 2023-05-21 DIAGNOSIS — E119 Type 2 diabetes mellitus without complications: Secondary | ICD-10-CM | POA: Insufficient documentation

## 2023-05-21 DIAGNOSIS — Z96652 Presence of left artificial knee joint: Secondary | ICD-10-CM | POA: Diagnosis not present

## 2023-05-21 DIAGNOSIS — D500 Iron deficiency anemia secondary to blood loss (chronic): Secondary | ICD-10-CM | POA: Diagnosis not present

## 2023-05-21 DIAGNOSIS — G4733 Obstructive sleep apnea (adult) (pediatric): Secondary | ICD-10-CM

## 2023-05-21 DIAGNOSIS — Z794 Long term (current) use of insulin: Secondary | ICD-10-CM

## 2023-05-21 DIAGNOSIS — Z79899 Other long term (current) drug therapy: Secondary | ICD-10-CM | POA: Diagnosis not present

## 2023-05-21 DIAGNOSIS — I8500 Esophageal varices without bleeding: Secondary | ICD-10-CM | POA: Insufficient documentation

## 2023-05-21 DIAGNOSIS — K7460 Unspecified cirrhosis of liver: Principal | ICD-10-CM | POA: Diagnosis present

## 2023-05-21 DIAGNOSIS — Z853 Personal history of malignant neoplasm of breast: Secondary | ICD-10-CM | POA: Diagnosis not present

## 2023-05-21 DIAGNOSIS — K3189 Other diseases of stomach and duodenum: Secondary | ICD-10-CM | POA: Insufficient documentation

## 2023-05-21 DIAGNOSIS — R188 Other ascites: Principal | ICD-10-CM

## 2023-05-21 DIAGNOSIS — I4819 Other persistent atrial fibrillation: Secondary | ICD-10-CM | POA: Diagnosis present

## 2023-05-21 DIAGNOSIS — E039 Hypothyroidism, unspecified: Secondary | ICD-10-CM | POA: Diagnosis not present

## 2023-05-21 DIAGNOSIS — K31819 Angiodysplasia of stomach and duodenum without bleeding: Secondary | ICD-10-CM | POA: Diagnosis not present

## 2023-05-21 DIAGNOSIS — I5032 Chronic diastolic (congestive) heart failure: Secondary | ICD-10-CM | POA: Diagnosis present

## 2023-05-21 DIAGNOSIS — K921 Melena: Secondary | ICD-10-CM | POA: Insufficient documentation

## 2023-05-21 DIAGNOSIS — K7290 Hepatic failure, unspecified without coma: Secondary | ICD-10-CM

## 2023-05-21 DIAGNOSIS — E876 Hypokalemia: Secondary | ICD-10-CM | POA: Diagnosis not present

## 2023-05-21 DIAGNOSIS — M7989 Other specified soft tissue disorders: Secondary | ICD-10-CM | POA: Diagnosis present

## 2023-05-21 DIAGNOSIS — R609 Edema, unspecified: Secondary | ICD-10-CM

## 2023-05-21 DIAGNOSIS — I110 Hypertensive heart disease with heart failure: Secondary | ICD-10-CM | POA: Insufficient documentation

## 2023-05-21 DIAGNOSIS — I509 Heart failure, unspecified: Secondary | ICD-10-CM | POA: Diagnosis not present

## 2023-05-21 DIAGNOSIS — I9589 Other hypotension: Secondary | ICD-10-CM | POA: Diagnosis not present

## 2023-05-21 DIAGNOSIS — D61818 Other pancytopenia: Secondary | ICD-10-CM | POA: Insufficient documentation

## 2023-05-21 DIAGNOSIS — I495 Sick sinus syndrome: Secondary | ICD-10-CM | POA: Diagnosis not present

## 2023-05-21 DIAGNOSIS — Z950 Presence of cardiac pacemaker: Secondary | ICD-10-CM | POA: Diagnosis not present

## 2023-05-21 DIAGNOSIS — D509 Iron deficiency anemia, unspecified: Secondary | ICD-10-CM | POA: Insufficient documentation

## 2023-05-21 LAB — GLUCOSE, CAPILLARY: Glucose-Capillary: 129 mg/dL — ABNORMAL HIGH (ref 70–99)

## 2023-05-21 LAB — CBC WITH DIFFERENTIAL/PLATELET
Abs Immature Granulocytes: 0.01 10*3/uL (ref 0.00–0.07)
Basophils Absolute: 0 10*3/uL (ref 0.0–0.1)
Basophils Relative: 1 %
Eosinophils Absolute: 0.1 10*3/uL (ref 0.0–0.5)
Eosinophils Relative: 3 %
HCT: 30.5 % — ABNORMAL LOW (ref 36.0–46.0)
Hemoglobin: 9.9 g/dL — ABNORMAL LOW (ref 12.0–15.0)
Immature Granulocytes: 1 %
Lymphocytes Relative: 30 %
Lymphs Abs: 0.6 10*3/uL — ABNORMAL LOW (ref 0.7–4.0)
MCH: 31.9 pg (ref 26.0–34.0)
MCHC: 32.5 g/dL (ref 30.0–36.0)
MCV: 98.4 fL (ref 80.0–100.0)
Monocytes Absolute: 0.2 10*3/uL (ref 0.1–1.0)
Monocytes Relative: 11 %
Neutro Abs: 1.2 10*3/uL — ABNORMAL LOW (ref 1.7–7.7)
Neutrophils Relative %: 54 %
Platelets: 65 10*3/uL — ABNORMAL LOW (ref 150–400)
RBC: 3.1 MIL/uL — ABNORMAL LOW (ref 3.87–5.11)
RDW: 15.5 % (ref 11.5–15.5)
Smear Review: NORMAL
WBC: 2.1 10*3/uL — ABNORMAL LOW (ref 4.0–10.5)
nRBC: 0 % (ref 0.0–0.2)

## 2023-05-21 LAB — COMPREHENSIVE METABOLIC PANEL WITH GFR
ALT: 27 U/L (ref 0–44)
AST: 42 U/L — ABNORMAL HIGH (ref 15–41)
Albumin: 2.8 g/dL — ABNORMAL LOW (ref 3.5–5.0)
Alkaline Phosphatase: 47 U/L (ref 38–126)
Anion gap: 6 (ref 5–15)
BUN: 17 mg/dL (ref 8–23)
CO2: 31 mmol/L (ref 22–32)
Calcium: 8 mg/dL — ABNORMAL LOW (ref 8.9–10.3)
Chloride: 97 mmol/L — ABNORMAL LOW (ref 98–111)
Creatinine, Ser: 0.93 mg/dL (ref 0.44–1.00)
GFR, Estimated: >60 mL/min (ref >60)
Glucose, Bld: 138 mg/dL — ABNORMAL HIGH (ref 70–99)
Potassium: 2.7 mmol/L — CL (ref 3.5–5.1)
Sodium: 134 mmol/L — ABNORMAL LOW (ref 135–145)
Total Bilirubin: 0.9 mg/dL (ref 0.0–1.2)
Total Protein: 6.6 g/dL (ref 6.5–8.1)

## 2023-05-21 LAB — PROTIME-INR
INR: 1.3 — ABNORMAL HIGH (ref 0.8–1.2)
Prothrombin Time: 15.9 s — ABNORMAL HIGH (ref 11.4–15.2)

## 2023-05-21 LAB — TROPONIN I (HIGH SENSITIVITY)
Troponin I (High Sensitivity): 13 ng/L (ref <18)
Troponin I (High Sensitivity): 13 ng/L (ref <18)

## 2023-05-21 LAB — APTT: aPTT: 33 s (ref 24–36)

## 2023-05-21 LAB — BRAIN NATRIURETIC PEPTIDE: B Natriuretic Peptide: 200.2 pg/mL — ABNORMAL HIGH (ref 0.0–100.0)

## 2023-05-21 LAB — MAGNESIUM: Magnesium: 2.1 mg/dL (ref 1.7–2.4)

## 2023-05-21 MED ORDER — INSULIN GLARGINE-YFGN 100 UNIT/ML ~~LOC~~ SOLN
20.0000 [IU] | Freq: Every day | SUBCUTANEOUS | Status: DC
  Filled 2023-05-21: qty 0.2

## 2023-05-21 MED ORDER — NADOLOL 20 MG PO TABS
20.0000 mg | ORAL_TABLET | Freq: Every day | ORAL | Status: DC
  Administered 2023-05-22 – 2023-05-23 (×2): 20 mg via ORAL
  Filled 2023-05-21 (×2): qty 1

## 2023-05-21 MED ORDER — MORPHINE SULFATE (PF) 2 MG/ML IV SOLN: 2.0000 mg | INTRAVENOUS | Status: DC | PRN

## 2023-05-21 MED ORDER — LEVOTHYROXINE SODIUM 50 MCG PO TABS
75.0000 ug | ORAL_TABLET | Freq: Every day | ORAL | Status: DC
  Administered 2023-05-22 – 2023-05-23 (×2): 75 ug via ORAL
  Filled 2023-05-21 (×2): qty 1

## 2023-05-21 MED ORDER — ONDANSETRON HCL 4 MG PO TABS: 4.0000 mg | ORAL_TABLET | Freq: Four times a day (QID) | ORAL | Status: DC | PRN

## 2023-05-21 MED ORDER — SPIRONOLACTONE 25 MG PO TABS
25.0000 mg | ORAL_TABLET | Freq: Every day | ORAL | Status: DC
Start: 2023-05-22 — End: 2023-05-23
  Administered 2023-05-22 – 2023-05-23 (×2): 25 mg via ORAL
  Filled 2023-05-21 (×2): qty 1

## 2023-05-21 MED ORDER — ACETAMINOPHEN 325 MG PO TABS: 650.0000 mg | ORAL_TABLET | Freq: Four times a day (QID) | ORAL | Status: DC | PRN

## 2023-05-21 MED ORDER — ACETAMINOPHEN 650 MG RE SUPP: 650.0000 mg | Freq: Four times a day (QID) | RECTAL | Status: DC | PRN

## 2023-05-21 MED ORDER — POTASSIUM CHLORIDE 10 MEQ/100ML IV SOLN
10.0000 meq | INTRAVENOUS | Status: AC
  Administered 2023-05-21 (×3): 10 meq via INTRAVENOUS
  Filled 2023-05-21 (×3): qty 100

## 2023-05-21 MED ORDER — SODIUM CHLORIDE 0.9 % IV SOLN: INTRAVENOUS | Status: AC | PRN

## 2023-05-21 MED ORDER — FENOFIBRATE 160 MG PO TABS
160.0000 mg | ORAL_TABLET | Freq: Every day | ORAL | Status: DC
  Administered 2023-05-22: 160 mg via ORAL
  Filled 2023-05-21 (×2): qty 1

## 2023-05-21 MED ORDER — ALBUTEROL SULFATE (2.5 MG/3ML) 0.083% IN NEBU: 2.5000 mg | INHALATION_SOLUTION | RESPIRATORY_TRACT | Status: DC | PRN

## 2023-05-21 MED ORDER — MAGNESIUM OXIDE -MG SUPPLEMENT 400 (240 MG) MG PO TABS
400.0000 mg | ORAL_TABLET | Freq: Two times a day (BID) | ORAL | Status: DC
  Administered 2023-05-22 – 2023-05-23 (×3): 400 mg via ORAL
  Filled 2023-05-21 (×3): qty 1

## 2023-05-21 MED ORDER — POTASSIUM CHLORIDE CRYS ER 20 MEQ PO TBCR
40.0000 meq | EXTENDED_RELEASE_TABLET | Freq: Once | ORAL | Status: AC
  Administered 2023-05-21: 40 meq via ORAL
  Filled 2023-05-21: qty 2

## 2023-05-21 MED ORDER — FUROSEMIDE 10 MG/ML IJ SOLN
20.0000 mg | Freq: Two times a day (BID) | INTRAMUSCULAR | Status: DC
  Administered 2023-05-22 (×2): 20 mg via INTRAVENOUS
  Filled 2023-05-21 (×2): qty 2

## 2023-05-21 MED ORDER — ONDANSETRON HCL 4 MG/2ML IJ SOLN: 4.0000 mg | Freq: Four times a day (QID) | INTRAMUSCULAR | Status: DC | PRN

## 2023-05-21 MED ORDER — SIMVASTATIN 20 MG PO TABS
20.0000 mg | ORAL_TABLET | Freq: Every day | ORAL | Status: DC
  Administered 2023-05-22: 20 mg via ORAL
  Filled 2023-05-21: qty 1

## 2023-05-21 MED ORDER — IOHEXOL 300 MG/ML  SOLN
100.0000 mL | Freq: Once | INTRAMUSCULAR | Status: AC | PRN
  Administered 2023-05-21: 100 mL via INTRAVENOUS

## 2023-05-21 MED ORDER — OXYCODONE HCL 5 MG PO TABS: 5.0000 mg | ORAL_TABLET | ORAL | Status: DC | PRN

## 2023-05-21 MED ORDER — GABAPENTIN 300 MG PO CAPS
300.0000 mg | ORAL_CAPSULE | Freq: Two times a day (BID) | ORAL | Status: DC
  Administered 2023-05-22 – 2023-05-23 (×3): 300 mg via ORAL
  Filled 2023-05-21 (×3): qty 1

## 2023-05-21 MED ORDER — POTASSIUM CHLORIDE CRYS ER 20 MEQ PO TBCR
20.0000 meq | EXTENDED_RELEASE_TABLET | Freq: Two times a day (BID) | ORAL | Status: DC
  Administered 2023-05-22: 20 meq via ORAL
  Filled 2023-05-21: qty 1

## 2023-05-21 MED ORDER — INSULIN ASPART 100 UNIT/ML IJ SOLN
0.0000 [IU] | INTRAMUSCULAR | Status: DC
  Administered 2023-05-21 – 2023-05-22 (×2): 2 [IU] via SUBCUTANEOUS
  Filled 2023-05-21 (×2): qty 1

## 2023-05-21 NOTE — Assessment & Plan Note (Addendum)
 Pancytopenia/thrombocytopenia/coagulopathy Esophageal varices s/p banding 4/1, portal hypertensive gastropathy Patient with ascites, pancytopenia, thrombocytopenia in spite of uptitrating outpatient diuretics No clinical evidence of hepatic encephalopathy IV Lasix, oral spironolactone, with BP parameters Continue nadolol Not currently on rifaximin or lactulose Daily weights with intake and output monitoring IR consult for paracentesis in the a.m.--no history of prior paracentesis Consider GI consult

## 2023-05-21 NOTE — Progress Notes (Signed)
 Symptom Management Clinic [facility] [phone] [phone]  Patient Care Team: Jaclyn Shaggy, MD as PCP - General (Internal Medicine) End, Cristal Deer, MD as PCP - Cardiology (Cardiology) Lanier Prude, MD as PCP - Electrophysiology (Cardiology) Earna Coder, MD as Consulting Physician (Internal Medicine)   NAME OF PATIENT: Annette Foster  [id number]  1940-06-02   DATE OF VISIT: 05/21/23  REASON FOR CONSULT: Annette Foster is a 83 y.o. female with multiple medical problems including history of DCIS, cirrhosis with portal hypertension, A-fib and pancytopenia.  Patient with history of GI bleed.  Follow-up at the cancer center for anemia.  INTERVAL HISTORY: Patient saw Consuello Masse, NP on 05/06/2023 for pancytopenia.  Patient has had recent history of GI bleed and recurrent melena and hematemesis.  She has received transfusion in the past.  Most recently, patient received Venofer yesterday.  Patient reportedly saw her PCP last week for worsening abdominal distention/lower extremity edema.  Dose of spironolactone and torsemide were increased.  Patient also underwent EGD on 05/13/2023 with findings of grade 2 esophageal varices, which were banded.  Patient also had portal hypertensive gastropathy and erythematous mucosa in the stomach.  Patient states that she has had 1 episode of black stools since she received her EGD.  Denies active bleeding.  Patient states that her edema and abdominal distention has continued to worsen, even after diuretics were increased.  Patient denies nausea or vomiting.  States that she is having some shortness of breath when she ambulates.  No chest pain.  Denies any neurologic complaints. Denies recent fevers or illnesses. Denies any nausea, vomiting, constipation, or diarrhea. Denies urinary complaints. Patient offers no further specific complaints today.   PAST MEDICAL HISTORY: Past  Medical History:  Diagnosis Date   Abdominal pain    Allergy    Cancer (HCC) 2017   breast cancer- Left   Cataract    CHF (congestive heart failure) (HCC)    Collagenous colitis    Coronary artery disease    Diabetes mellitus without complication (HCC)    Diarrhea    Diverticulosis    Dysrhythmia    Fatty liver disease, nonalcoholic    Fibrocystic breast    GERD (gastroesophageal reflux disease)    Heart murmur    Hyperlipidemia    Hypertension    Hypothyroidism    IBS (irritable bowel syndrome)    IDA (iron deficiency anemia)    Liver cirrhosis (HCC)    Personal history of radiation therapy 2017   LEFT BREAST CA   PONV (postoperative nausea and vomiting)    Presence of permanent cardiac pacemaker    Sleep apnea    C-Pap    PAST SURGICAL HISTORY:  Past Surgical History:  Procedure Laterality Date   ABDOMINAL HYSTERECTOMY     [place] bso   ABDOMINAL SURGERY     APPENDECTOMY     AV NODE ABLATION N/A 05/17/2021   Procedure: AV NODE ABLATION;  Surgeon: Lanier Prude, MD;  [facility]: [facility];  Service: Cardiovascular;  Laterality: N/A;   BREAST BIOPSY Bilateral 2016   negative   BREAST 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/2015   DCIS, papillary carcinoma in situ   BREAST BIOPSY Left 05/27/2016   BENIGN MAMMARY EPITHELIUM   BREAST BIOPSY Left 11/20/2017   affirm bx x clip BENIGN MAMMARY EPITHELIUM CONSISTENT WITH RAD THERAPY   BREAST EXCISIONAL BIOPSY Right    NEG 1980's   BREAST LUMPECTOMY Left 10/17/2015  DCIS and papillary carcinoma insitu, clear margins   CARDIAC SURGERY     has replacement valve   CATARACT EXTRACTION Right    CATARACT EXTRACTION W/PHACO Left 01/16/2021   Procedure: CATARACT EXTRACTION PHACO AND INTRAOCULAR LENS PLACEMENT (IOC) LEFT DIABETIC 8.46 00:59.8;  Surgeon: Galen Manila, MD;  [facility];  Service: Ophthalmology;  Laterality: Left;   CHOLECYSTECTOMY     COLONOSCOPY WITH PROPOFOL N/A 03/11/2016   Procedure: COLONOSCOPY  WITH PROPOFOL;  Surgeon: Scot Jun, MD;  Location: [facility] ENDOSCOPY;  Service: Endoscopy;  Laterality: N/A;   COLONOSCOPY WITH PROPOFOL N/A 02/18/2020   Procedure: COLONOSCOPY WITH PROPOFOL;  Surgeon: Midge Minium, MD;  Location: [facility] ENDOSCOPY;  Service: Endoscopy;  Laterality: N/A;   COLONOSCOPY WITH PROPOFOL N/A 09/10/2022   Procedure: COLONOSCOPY WITH PROPOFOL;  Surgeon: Wyline Mood, MD;  Location: [facility] ENDOSCOPY;  Service: Gastroenterology;  Laterality: N/A;   ESOPHAGEAL BANDING  09/10/2022   Procedure: ESOPHAGEAL BANDING;  Surgeon: Wyline Mood, MD;  Location: [facility] ENDOSCOPY;  Service: Gastroenterology;;   ESOPHAGEAL BANDING  10/17/2022   Procedure: ESOPHAGEAL BANDING;  Surgeon: Midge Minium, MD;  Location: [facility] ENDOSCOPY;  Service: Endoscopy;;   ESOPHAGEAL BANDING  12/24/2022   Procedure: ESOPHAGEAL BANDING;  Surgeon: Midge Minium, MD;  Location: [facility] ENDOSCOPY;  Service: Endoscopy;;   ESOPHAGEAL BANDING  05/13/2023   Procedure: ESOPHAGOSCOPY, WITH ESOPHAGEAL VARICES BAND LIGATION;  Surgeon: Midge Minium, MD;  Location: [facility] ENDOSCOPY;  Service: Endoscopy;;   ESOPHAGOGASTRODUODENOSCOPY N/A 05/13/2023   Procedure: EGD (ESOPHAGOGASTRODUODENOSCOPY);  Surgeon: Midge Minium, MD;  Location: [facility] ENDOSCOPY;  Service: Endoscopy;  Laterality: N/A;   ESOPHAGOGASTRODUODENOSCOPY (EGD) WITH PROPOFOL N/A 03/11/2016   Procedure: ESOPHAGOGASTRODUODENOSCOPY (EGD) WITH PROPOFOL;  Surgeon: Scot Jun, MD;  Location: [facility] ENDOSCOPY;  Service: Endoscopy;  Laterality: N/A;   ESOPHAGOGASTRODUODENOSCOPY (EGD) WITH PROPOFOL N/A 02/18/2020   Procedure: ESOPHAGOGASTRODUODENOSCOPY (EGD) WITH PROPOFOL;  Surgeon: Midge Minium, MD;  Location: [facility] ENDOSCOPY;  Service: Endoscopy;  Laterality: N/A;   ESOPHAGOGASTRODUODENOSCOPY (EGD) WITH PROPOFOL N/A 04/25/2020   Procedure: ESOPHAGOGASTRODUODENOSCOPY (EGD) WITH PROPOFOL;  Surgeon: Midge Minium, MD;  Location: [facility] ENDOSCOPY;  Service: Endoscopy;  Laterality: N/A;    ESOPHAGOGASTRODUODENOSCOPY (EGD) WITH PROPOFOL N/A 05/23/2020   Procedure: ESOPHAGOGASTRODUODENOSCOPY (EGD) WITH PROPOFOL;  Surgeon: Midge Minium, MD;  Location: [facility] ENDOSCOPY;  Service: Endoscopy;  Laterality: N/A;   ESOPHAGOGASTRODUODENOSCOPY (EGD) WITH PROPOFOL N/A 09/10/2022   Procedure: ESOPHAGOGASTRODUODENOSCOPY (EGD) WITH PROPOFOL;  Surgeon: Wyline Mood, MD;  Location: [facility] ENDOSCOPY;  Service: Gastroenterology;  Laterality: N/A;   ESOPHAGOGASTRODUODENOSCOPY (EGD) WITH PROPOFOL N/A 10/17/2022   Procedure: ESOPHAGOGASTRODUODENOSCOPY (EGD) WITH PROPOFOL;  Surgeon: Midge Minium, MD;  Location: [facility] ENDOSCOPY;  Service: Endoscopy;  Laterality: N/A;   ESOPHAGOGASTRODUODENOSCOPY (EGD) WITH PROPOFOL N/A 12/24/2022   Procedure: ESOPHAGOGASTRODUODENOSCOPY (EGD) WITH PROPOFOL;  Surgeon: Midge Minium, MD;  Location: [facility] ENDOSCOPY;  Service: Endoscopy;  Laterality: N/A;   ESOPHAGOGASTRODUODENOSCOPY (EGD) WITH PROPOFOL N/A 01/29/2023   Procedure: ESOPHAGOGASTRODUODENOSCOPY (EGD) WITH PROPOFOL;  Surgeon: Regis Bill, MD;  Location: [facility] ENDOSCOPY;  Service: Endoscopy;  Laterality: N/A;  Ideally we would intubate if possible given concern for varices   EUS N/A 10/04/2021   Procedure: LOWER ENDOSCOPIC ULTRASOUND (EUS);  Surgeon: Doren Custard, MD;  Location: [facility] ENDOSCOPY;  Service: Gastroenterology;  Laterality: N/A;  [facility]   EYE SURGERY     FINGER ARTHROSCOPY WITH CARPOMETACARPEL (CMC) ARTHROPLASTY Right 09/03/2018   Procedure: CARPOMETACARPEL [facility]) ARTHROPLASTY RIGHT THUMB;  Surgeon: Kennedy Bucker, MD;  Location: [facility] ORS;  Service: Orthopedics;  Laterality: Right;  FLEXIBLE SIGMOIDOSCOPY N/A 09/14/2021   Procedure: FLEXIBLE SIGMOIDOSCOPY;  Surgeon: Midge Minium, MD;  Location: [facility] ENDOSCOPY;  Service: Endoscopy;  Laterality: N/A;  No anesthesia   GANGLION CYST EXCISION Right 09/03/2018   Procedure: REMOVAL GANGLION OF WRIST;  Surgeon: Kennedy Bucker, MD;  Location: [facility] ORS;  Service:  Orthopedics;  Laterality: Right;   HARDWARE REMOVAL Right 09/03/2018   Procedure: HARDWARE REMOVAL RIGHT THUMB;  Surgeon: Kennedy Bucker, MD;  Location: [facility] ORS;  Service: Orthopedics;  Laterality: Right;  staple removed   HEMOSTASIS CLIP PLACEMENT  09/10/2022   Procedure: HEMOSTASIS CLIP PLACEMENT;  Surgeon: Wyline Mood, MD;  Location: [facility] ENDOSCOPY;  Service: Gastroenterology;;   HEMOSTASIS CONTROL  09/10/2022   Procedure: HEMOSTASIS CONTROL;  Surgeon: Wyline Mood, MD;  Location: [facility] ENDOSCOPY;  Service: Gastroenterology;;   HEMOSTASIS CONTROL  01/29/2023   Procedure: HEMOSTASIS CONTROL;  Surgeon: Regis Bill, MD;  Location: [facility] ENDOSCOPY;  Service: Endoscopy;;   JOINT REPLACEMENT Left    TKR   left sinusplasty      MASTECTOMY, PARTIAL Left 10/17/2015   Procedure: MASTECTOMY PARTIAL REVISION;  Surgeon: Nadeen Landau, MD;  Location: [facility] ORS;  Service: General;  Laterality: Left;   PACEMAKER IMPLANT N/A 03/23/2021   Procedure: PACEMAKER IMPLANT;  Surgeon: Lanier Prude, MD;  Location: [facility] LAB;  Service: Cardiovascular;  Laterality: N/A;   PARTIAL MASTECTOMY WITH NEEDLE LOCALIZATION Left 09/29/2015   Procedure: PARTIAL MASTECTOMY WITH NEEDLE LOCALIZATION;  Surgeon: Nadeen Landau, MD;  Location: [facility] ORS;  Service: General;  Laterality: Left;   POLYPECTOMY  09/10/2022   Procedure: POLYPECTOMY;  Surgeon: Wyline Mood, MD;  Location: [facility] ENDOSCOPY;  Service: Gastroenterology;;   RECTAL EXAM UNDER ANESTHESIA N/A 10/05/2021   Procedure: RECTAL EXAM UNDER ANESTHESIA, ASPIRATION OF RECTAL CYST;  Surgeon: Carolan Shiver, MD;  Location: [facility] ORS;  Service: General;  Laterality: N/A;   TOTAL ABDOMINAL HYSTERECTOMY W/ BILATERAL SALPINGOOPHORECTOMY      HEMATOLOGY/ONCOLOGY HISTORY:  Oncology History Overview Note  # AUG 2017- DCIS LEFT BREAST ER/PR- POS; positive margins [Dr.Smith]; s/p re-excision; s/p RT [finish RT Nov 10th 2017]; START ARIMIDEX- Jan 2018;  Stopped in July 2018- sec to muscle cramps; Sep 2018- start Letrozole.;  JAN 2019-STOPPED Letrozole sec night sweats/poor tolerance  # Mild pancytopenia [CTApril 2019-cirrhosis/spleneomegaly]. OCT 2018- BMBx- MILD dyspoiesis; variable cellularity [10-50%]; FISH/Cytogenetics-NORMAL; F-One-NGS-declined by insurance.    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   #Frequent UTIs [2019-2020; Dr. Lucianne Muss; Dr.Wohl/  # HRT [clinical trial thru [facility]; stopped July 2017 ]; CPAP   DIAGNOSIS: Left breast DCIS  STAGE:    0     ;GOALS: cure  CURRENT/MOST RECENT THERAPY: surveillaince    Ductal carcinoma in situ (DCIS) of left breast    ALLERGIES:  is allergic to codeine, demeclocycline, demerol [meperidine], hydrocodone, morphine, other, oxycodone, pentazocine, tetracyclines & related, coal tar extract, hydrocodone-acetaminophen, salicylic acid, and tetracycline hcl.  MEDICATIONS:  Current Outpatient Medications  Medication Sig Dispense Refill   Azelastine HCl 137 MCG/SPRAY SOLN INHALE 1-2 PUFFS IN EACH NOSTRIL NASALLY ONCE A DAY 30 DAY(S)     benzonatate (TESSALON) 100 MG capsule Take 100 mg by mouth 3 (three) times daily as needed.     cephALEXin (KEFLEX) 500 MG capsule Take 500 mg by mouth 3 (three) times daily.     Cholecalciferol (VITAMIN D) 50 MCG (2000 UT) CAPS Take 2,000  Units by mouth daily.     clobetasol ointment (TEMOVATE) 0.05 % Apply 1 application topically as needed (vaginal irritation).     clotrimazole (LOTRIMIN) 1 % cream Apply 1 Application topically 2 (two) times daily as needed.     cyanocobalamin (,VITAMIN B-12,) 1000 MCG/ML injection 1,000 mcg every 30 (thirty) days.     docusate sodium (COLACE) 100 MG capsule Take 100 mg by mouth 3 (three) times daily as needed for moderate constipation.     esomeprazole  (NEXIUM) 40 MG capsule Take 40 mg by mouth 2 (two) times daily before a meal.      fenofibrate 160 MG tablet Take 160 mg by mouth at bedtime.     Insulin Aspart (NOVOLOG FLEXPEN [place]) Inject into the skin. Sliding scale     insulin degludec (TRESIBA FLEXTOUCH) 200 UNIT/ML FlexTouch Pen 200 Units at bedtime.     levocetirizine (XYZAL) 5 MG tablet Take 5 mg by mouth every evening.     levothyroxine (SYNTHROID, LEVOTHROID) 75 MCG tablet Take 75 mcg by mouth daily before breakfast.      magnesium oxide (MAG-OX) 400 MG tablet Take 400 mg by mouth 2 (two) times daily.      MOUNJARO 15 MG/0.5ML Pen Inject 15 mg into the skin once a week.     nadolol (CORGARD) 20 MG tablet Take 1 tablet (20 mg total) by mouth daily. 90 tablet 3   NONFORMULARY OR COMPOUNDED ITEM Nifedipine 0.3% plus lidocaine 2% cream apply to rectum BID and after every BM.     potassium chloride SA (K-DUR,KLOR-CON) 20 MEQ tablet Take 20 mEq by mouth 2 (two) times daily.      saccharomyces boulardii (FLORASTOR) 250 MG capsule Take 250 mg by mouth 2 (two) times daily.      simvastatin (ZOCOR) 20 MG tablet Take 20 mg by mouth at bedtime.     spironolactone (ALDACTONE) 25 MG tablet TAKE 1/2 TABLET BY MOUTH DAILY (Patient taking differently: Take 25 mg by mouth daily.) 45 tablet 1   torsemide (DEMADEX) 20 MG tablet Take 40 mg by mouth daily.     valACYclovir (VALTREX) 500 MG tablet Take 500 mg by mouth 2 (two) times daily as needed.     dicyclomine (BENTYL) 10 MG capsule Take 10 mg by mouth in the morning, at noon, and at bedtime.     gabapentin (NEURONTIN) 300 MG capsule Take 300 mg by mouth 2 (two) times daily.     No current facility-administered medications for this visit.    VITAL SIGNS: LMP  (LMP Unknown)  There were no vitals filed for this visit.  Estimated body mass index is 23.56 kg/m as calculated from the following:   Height as of 05/07/23: 5\' 3"  (1.6 m).   Weight as of 05/13/23: 133 lb (60.3 kg).  LABS: CBC:    Component  Value Date/Time   WBC 1.6 (L) 05/20/2023 1033   WBC 1.4 (LL) 03/04/2023 0248   HGB 10.0 (L) 05/20/2023 1033   HGB 11.9 04/26/2021 1439   HCT 31.3 (L) 05/20/2023 1033   HCT 34.3 04/26/2021 1439   PLT 52 (L) 05/20/2023 1033   PLT 75 (LL) 04/26/2021 1439   MCV 98.4 05/20/2023 1033   MCV 88 04/26/2021 1439   NEUTROABS 0.8 (L) 05/20/2023 1033   NEUTROABS 1.2 (L) 04/26/2021 1439   LYMPHSABS 0.6 (L) 05/20/2023 1033   LYMPHSABS 0.8 04/26/2021 1439   MONOABS 0.2 05/20/2023 1033   EOSABS 0.1 05/20/2023 1033   EOSABS 0.1  04/26/2021 1439   BASOSABS 0.0 05/20/2023 1033   BASOSABS 0.0 04/26/2021 1439   Comprehensive Metabolic Panel:    Component Value Date/Time   NA 138 05/06/2023 1011   NA 138 10/03/2022 1509   K 3.5 05/06/2023 1011   CL 102 05/06/2023 1011   CO2 27 05/06/2023 1011   BUN 31 (H) 05/06/2023 1011   BUN 15 10/03/2022 1509   CREATININE 1.32 (H) 05/06/2023 1011   GLUCOSE 134 (H) 05/06/2023 1011   CALCIUM 8.5 (L) 05/06/2023 1011   AST 30 05/06/2023 1011   ALT 22 05/06/2023 1011   ALKPHOS 53 05/06/2023 1011   BILITOT 1.0 05/06/2023 1011   PROT 7.0 05/06/2023 1011   PROT 7.1 11/30/2020 0944   ALBUMIN 3.3 (L) 05/06/2023 1011   ALBUMIN 3.8 11/30/2020 0944    RADIOGRAPHIC STUDIES: CUP PACEART INCLINIC DEVICE CHECK Result Date: 05/20/2023 Normal in-clinic dual chamber pacemaker check. Presenting Rhythm: APVP . Routine testing of thresholds, sensing, and impedance demonstrate stable parameters and no programming changes needed at this time. 20 episodes of AT with longest lasting 1 day and the next longest 10.5 hours. No OAC. Overall burden 5%. Estimated longevity 6.3 years . Pt enrolled in remote follow-up. Changes made this session and as follows. - RA output increased to 1.9V - Program count increased from 13 to 14 - Changes made per CL .Raj Janus, RN  CT CHEST WO CONTRAST Result Date: 05/02/2023 CLINICAL DATA:  Shortness of breath EXAM: CT CHEST WITHOUT CONTRAST TECHNIQUE:  Multidetector CT imaging of the chest was performed following the standard protocol without IV contrast. RADIATION DOSE REDUCTION: This exam was performed according to the departmental dose-optimization program which includes automated exposure control, adjustment of the mA and/or kV according to patient size and/or use of iterative reconstruction technique. COMPARISON:  Chest x-ray April 01, 2023 CT heart October 24, 2022 CT chest September 11, 2022 FINDINGS: Cardiovascular: No significant vascular findings. Normal heart size. No pericardial effusion. Extensive coronary artery calcifications thoracic aorta unremarkable. No pericardial effusions Mediastinum/Nodes: No enlarged mediastinal or axillary lymph nodes. Thyroid gland, trachea, and esophagus demonstrate no significant findings. Lungs/Pleura: Ill-defined left upper lobe infiltrate and fibro atelectatic changes are unchanged since prior examination September 11, 2022 indicating chronic changes Some residual interstitial prominence of the peripheral margin of the right upper lobe posterior segment correlate with postinflammatory changes related to the previously seen consolidative pneumonia on 2024 Right middle lobe atelectasis. Some mild bronchiectatic changes of the right middle lobe lingula and right lower lobe posteriorly as well as the lateral segment of the left lower lobe. No acute infiltrates or consolidations Trachea and bronchi are unremarkable Upper Abdomen: Abnormal appearing liver markedly increased in echogenicity with nodularity suggestive of hepatocellular disease such as cirrhosis with an enlarged spleen suggestive of splenomegaly and portal hypertension. And small amount of ascites in the upper abdomen prior cholecystectomy. Musculoskeletal: IMPRESSION: *No acute infiltrates or consolidations. *Chronic changes in the lungs as described above. *Abnormal appearing liver with splenomegaly and small amount of ascites in the upper abdomen. Findings  suggestive of hepatocellular disease such as cirrhosis with portal hypertension. *Extensive coronary artery calcifications. *Right middle lobe atelectasis. *Some mild bronchiectatic changes of the right middle lobe lingula and right lower lobe posteriorly as well as the lateral segment of the left lower lobe. *Some residual interstitial prominence of the peripheral margin of the right upper lobe posterior segment correlate with postinflammatory changes related to the previously seen consolidative pneumonia on 2024. Electronically Signed   By:  Shaaron Adler M.D.   On: 05/02/2023 11:12    PERFORMANCE STATUS (ECOG) : 2 - Symptomatic, <50% confined to bed  Review of Systems Unless otherwise noted, a complete review of systems is negative.  Physical Exam General: NAD Cardiovascular: regular rate and rhythm Pulmonary: clear anterior/posterior fields Abdomen: Protuberant, positive fluid wave GU: no suprapubic tenderness Extremities: no edema, no joint deformities Skin: no rashes Neurological: Weakness but otherwise nonfocal  IMPRESSION/PLAN: Pancytopenia -likely secondary to cirrhosis.  Patient had Venofer yesterday with stable hemoglobin.  Worsening BLE edema/abdominal distention/anasarca -worsening fluid retention despite recent increase in diuretics per PCP.  Suspect cirrhosis and/or CHF. Discussed with Dr. Donneta Romberg and patient advised to transfer the emergency department for further evaluation and management.  Case and plan discussed with Dr. Donneta Romberg. Report called to ED triage.   Patient expressed understanding and was in agreement with this plan. She also understands that She can call clinic at any time with any questions, concerns, or complaints.   Thank you for allowing me to participate in the care of this very pleasant patient.   Time Total: 20 minutes  Visit consisted of counseling and education dealing with the complex and emotionally intense issues of symptom management in  the setting of serious illness.Greater than 50%  of this time was spent counseling and coordinating care related to the above assessment and plan.  Signed by: Laurette Schimke, PhD, NP-C

## 2023-05-21 NOTE — Assessment & Plan Note (Signed)
 No acute issues continue levothyroxine

## 2023-05-21 NOTE — Assessment & Plan Note (Signed)
 Not on systemic anticoagulation likely due to history of GI bleed Continue nadolol for rate control

## 2023-05-21 NOTE — ED Triage Notes (Signed)
 Pt c/o BLE edema and abdominal distention. Pt reports she noticed the increase in fluid starting last Friday and has worsened everyday.

## 2023-05-21 NOTE — ED Triage Notes (Signed)
 First Nurse Note: Patient to ED from the cancer center. Pt sent over due to edema and abd distention. Hx of cirrhosis. Recently increased diuretics by PCP with no improvement.

## 2023-05-21 NOTE — H&P (Signed)
 History and Physical    Patient: Annette Foster ZOX:[id number] DOB: [birth year] DOA: 05/21/2023 DOS: the patient was seen and examined on 05/21/2023 PCP: Jaclyn Shaggy, MD  Patient coming from: Home  Chief Complaint:  Chief Complaint  Patient presents with   Leg Swelling   Bloated    HPI: Annette Foster is a 83 y.o. 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  Patient was seen at their office earlier in the day for routine visit following her Venofer infusion but she complained of lower extremity edema and worsening abdominal distention in spite of increase in Lasix and spironolactone by her PCP the week prior.  Additionally, she mentioned that since her banding procedure on 4/1 she has had an episode of melanotic stool.  She has no nausea or vomiting.  She does have some exertional dyspnea without palpitations or chest pain. ED course and data review: Soft blood pressure of 106/84 with other vitals normal vitals Labs pertinent for the following: Troponin 13 and BNP 200 Potassium 2.7, sodium 134 Mild AST elevation to 42, albumin of 2.8-LFTs otherwise unremarkableHemoglobin 9.9, which is a baseline Thrombocytopenia of 65,000, at baseline, INR 1.3 Leukopenia of 2.1 which is baseline CT abdomen and pelvis showing cirrhosis with moderate ascites and mild splenomegaly.  Mild sigmoid diverticulosis, no bowel obstruction  Patient given potassium repletion, oral and IV Hospitalist consulted for admission.   Review of Systems: As mentioned in the history of present illness. All other systems reviewed and are negative.  Past Medical History:   Diagnosis Date   Abdominal pain    Allergy    Cancer (HCC) 2017   breast cancer- Left   Cataract    CHF (congestive heart failure) (HCC)    Collagenous colitis    Coronary artery disease    Diabetes mellitus without complication (HCC)    Diarrhea    Diverticulosis    Dysrhythmia    Fatty liver disease, nonalcoholic    Fibrocystic breast    GERD (gastroesophageal reflux disease)    Heart murmur    Hyperlipidemia    Hypertension    Hypothyroidism    IBS (irritable bowel syndrome)    IDA (iron deficiency anemia)    Liver cirrhosis (HCC)    Personal history of radiation therapy 2017   LEFT BREAST CA   PONV (postoperative nausea and vomiting)    Presence of permanent cardiac pacemaker    Sleep apnea    C-Pap   Past Surgical History:  Procedure Laterality Date   ABDOMINAL HYSTERECTOMY     [place] b[place]   ABDOMINAL SURGERY     APPENDECTOMY     AV NODE ABLATION N/A 05/17/2021   Procedure: AV NODE ABLATION;  Surgeon: Lanier Prude, MD;  Location: [facility];  Service: Cardiovascular;  Laterality: N/A;   BREAST BIOPSY Bilateral 2016   negative   BREAST 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/2015   DCIS, papillary carcinoma in situ   BREAST BIOPSY Left 05/27/2016   BENIGN MAMMARY EPITHELIUM   BREAST BIOPSY Left 11/20/2017   affirm bx x clip BENIGN MAMMARY EPITHELIUM CONSISTENT WITH RAD THERAPY   BREAST EXCISIONAL BIOPSY Right    NEG 1980's   BREAST LUMPECTOMY  Left 10/17/2015   DCIS and papillary carcinoma insitu, clear margins   CARDIAC SURGERY     has replacement valve   CATARACT EXTRACTION Right    CATARACT EXTRACTION W/PHACO Left 01/16/2021   Procedure: CATARACT EXTRACTION PHACO AND INTRAOCULAR LENS PLACEMENT (IOC) LEFT DIABETIC 8.46 00:59.8;  Surgeon: Galen Manila, MD;  Location: [facility];  Service: Ophthalmology;  Laterality: Left;   CHOLECYSTECTOMY     COLONOSCOPY WITH PROPOFOL N/A 03/11/2016   Procedure: COLONOSCOPY WITH PROPOFOL;  Surgeon: Scot Jun,  MD;  Location: [facility] ENDOSCOPY;  Service: Endoscopy;  Laterality: N/A;   COLONOSCOPY WITH PROPOFOL N/A 02/18/2020   Procedure: COLONOSCOPY WITH PROPOFOL;  Surgeon: Midge Minium, MD;  Location: [facility] ENDOSCOPY;  Service: Endoscopy;  Laterality: N/A;   COLONOSCOPY WITH PROPOFOL N/A 09/10/2022   Procedure: COLONOSCOPY WITH PROPOFOL;  Surgeon: Wyline Mood, MD;  Location: [facility] ENDOSCOPY;  Service: Gastroenterology;  Laterality: N/A;   ESOPHAGEAL BANDING  09/10/2022   Procedure: ESOPHAGEAL BANDING;  Surgeon: Wyline Mood, MD;  Location: [facility] ENDOSCOPY;  Service: Gastroenterology;;   ESOPHAGEAL BANDING  10/17/2022   Procedure: ESOPHAGEAL BANDING;  Surgeon: Midge Minium, MD;  Location: [facility] ENDOSCOPY;  Service: Endoscopy;;   ESOPHAGEAL BANDING  12/24/2022   Procedure: ESOPHAGEAL BANDING;  Surgeon: Midge Minium, MD;  Location: [facility] ENDOSCOPY;  Service: Endoscopy;;   ESOPHAGEAL BANDING  05/13/2023   Procedure: ESOPHAGOSCOPY, WITH ESOPHAGEAL VARICES BAND LIGATION;  Surgeon: Midge Minium, MD;  Location: [facility] ENDOSCOPY;  Service: Endoscopy;;   ESOPHAGOGASTRODUODENOSCOPY N/A 05/13/2023   Procedure: EGD (ESOPHAGOGASTRODUODENOSCOPY);  Surgeon: Midge Minium, MD;  Location: [facility] ENDOSCOPY;  Service: Endoscopy;  Laterality: N/A;   ESOPHAGOGASTRODUODENOSCOPY (EGD) WITH PROPOFOL N/A 03/11/2016   Procedure: ESOPHAGOGASTRODUODENOSCOPY (EGD) WITH PROPOFOL;  Surgeon: Scot Jun, MD;  Location: [facility] ENDOSCOPY;  Service: Endoscopy;  Laterality: N/A;   ESOPHAGOGASTRODUODENOSCOPY (EGD) WITH PROPOFOL N/A 02/18/2020   Procedure: ESOPHAGOGASTRODUODENOSCOPY (EGD) WITH PROPOFOL;  Surgeon: Midge Minium, MD;  Location: [facility] ENDOSCOPY;  Service: Endoscopy;  Laterality: N/A;   ESOPHAGOGASTRODUODENOSCOPY (EGD) WITH PROPOFOL N/A 04/25/2020   Procedure: ESOPHAGOGASTRODUODENOSCOPY (EGD) WITH PROPOFOL;  Surgeon: Midge Minium, MD;  Location: [facility] ENDOSCOPY;  Service: Endoscopy;  Laterality: N/A;   ESOPHAGOGASTRODUODENOSCOPY (EGD) WITH PROPOFOL  N/A 05/23/2020   Procedure: ESOPHAGOGASTRODUODENOSCOPY (EGD) WITH PROPOFOL;  Surgeon: Midge Minium, MD;  Location: [facility] ENDOSCOPY;  Service: Endoscopy;  Laterality: N/A;   ESOPHAGOGASTRODUODENOSCOPY (EGD) WITH PROPOFOL N/A 09/10/2022   Procedure: ESOPHAGOGASTRODUODENOSCOPY (EGD) WITH PROPOFOL;  Surgeon: Wyline Mood, MD;  Location: [facility] ENDOSCOPY;  Service: Gastroenterology;  Laterality: N/A;   ESOPHAGOGASTRODUODENOSCOPY (EGD) WITH PROPOFOL N/A 10/17/2022   Procedure: ESOPHAGOGASTRODUODENOSCOPY (EGD) WITH PROPOFOL;  Surgeon: Midge Minium, MD;  Location: [facility] ENDOSCOPY;  Service: Endoscopy;  Laterality: N/A;   ESOPHAGOGASTRODUODENOSCOPY (EGD) WITH PROPOFOL N/A 12/24/2022   Procedure: ESOPHAGOGASTRODUODENOSCOPY (EGD) WITH PROPOFOL;  Surgeon: Midge Minium, MD;  Location: [facility] ENDOSCOPY;  Service: Endoscopy;  Laterality: N/A;   ESOPHAGOGASTRODUODENOSCOPY (EGD) WITH PROPOFOL N/A 01/29/2023   Procedure: ESOPHAGOGASTRODUODENOSCOPY (EGD) WITH PROPOFOL;  Surgeon: Regis Bill, MD;  Location: [facility] ENDOSCOPY;  Service: Endoscopy;  Laterality: N/A;  Ideally we would intubate if possible given concern for varices   EUS N/A 10/04/2021   Procedure: LOWER ENDOSCOPIC ULTRASOUND (EUS);  Surgeon: Doren Custard, MD;  Location: [facility] ENDOSCOPY;  Service: Gastroenterology;  Laterality: N/A;  [facility]   EYE SURGERY     FINGER ARTHROSCOPY WITH CARPOMETACARPEL (CMC) ARTHROPLASTY Right 09/03/2018   Procedure: CARPOMETACARPEL [facility]) ARTHROPLASTY RIGHT THUMB;  Surgeon: Kennedy Bucker, MD;  Location: [facility] ORS;  Service: Orthopedics;  Laterality: Right;   FLEXIBLE SIGMOIDOSCOPY N/A 09/14/2021   Procedure: FLEXIBLE SIGMOIDOSCOPY;  Surgeon: Midge Minium, MD;  Location: [facility] ENDOSCOPY;  Service: Endoscopy;  Laterality: N/A;  No anesthesia   GANGLION CYST EXCISION Right 09/03/2018   Procedure: REMOVAL GANGLION OF WRIST;  Surgeon: Kennedy Bucker, MD;  Location: [facility] ORS;  Service: Orthopedics;  Laterality: Right;   HARDWARE  REMOVAL Right 09/03/2018   Procedure: HARDWARE REMOVAL RIGHT THUMB;  Surgeon: Kennedy Bucker, MD;  Location: [facility] ORS;  Service: Orthopedics;  Laterality: Right;  staple removed   HEMOSTASIS CLIP PLACEMENT  09/10/2022   Procedure: HEMOSTASIS CLIP PLACEMENT;  Surgeon: Wyline Mood, MD;  Location: [facility] ENDOSCOPY;  Service: Gastroenterology;;   HEMOSTASIS CONTROL  09/10/2022   Procedure: HEMOSTASIS CONTROL;  Surgeon: Wyline Mood, MD;  Location: [facility] ENDOSCOPY;  Service: Gastroenterology;;   HEMOSTASIS CONTROL  01/29/2023   Procedure: HEMOSTASIS CONTROL;  Surgeon: Regis Bill, MD;  Location: [facility] ENDOSCOPY;  Service: Endoscopy;;   JOINT REPLACEMENT Left    TKR   left sinusplasty      MASTECTOMY, PARTIAL Left 10/17/2015   Procedure: MASTECTOMY PARTIAL REVISION;  Surgeon: Nadeen Landau, MD;  Location: [facility] ORS;  Service: General;  Laterality: Left;   PACEMAKER IMPLANT N/A 03/23/2021   Procedure: PACEMAKER IMPLANT;  Surgeon: Lanier Prude, MD;  Location: [facility];  Service: Cardiovascular;  Laterality: N/A;   PARTIAL MASTECTOMY WITH NEEDLE LOCALIZATION Left 09/29/2015   Procedure: PARTIAL MASTECTOMY WITH NEEDLE LOCALIZATION;  Surgeon: Nadeen Landau, MD;  Location: [facility] ORS;  Service: General;  Laterality: Left;   POLYPECTOMY  09/10/2022   Procedure: POLYPECTOMY;  Surgeon: Wyline Mood, MD;  Location: [facility] ENDOSCOPY;  Service: Gastroenterology;;   RECTAL EXAM UNDER ANESTHESIA N/A 10/05/2021   Procedure: RECTAL EXAM UNDER ANESTHESIA, ASPIRATION OF RECTAL CYST;  Surgeon: Carolan Shiver, MD;  Location: [facility] ORS;  Service: General;  Laterality: N/A;   TOTAL ABDOMINAL HYSTERECTOMY W/ BILATERAL SALPINGOOPHORECTOMY     Social History:  reports that she has never smoked. She has never used smokeless tobacco. She reports that she does not drink alcohol and does not use drugs.  Allergies  Allergen Reactions   Codeine Itching, Nausea And Vomiting and Other (See Comments)    Demeclocycline Rash   Demerol [Meperidine] Itching and Nausea And Vomiting   Hydrocodone Itching and Nausea And Vomiting   Morphine Nausea Only   Other Other (See Comments)   Oxycodone Itching and Nausea And Vomiting   Pentazocine Itching, Nausea And Vomiting and Other (See Comments)   Tetracyclines & Related Rash   Coal Tar Extract Other (See Comments)   Hydrocodone-Acetaminophen Other (See Comments)   Salicylic Acid Other (See Comments)   Tetracycline Hcl Other (See Comments)    Family History  Problem Relation Age of Onset   Breast cancer Paternal Grandmother    Colon cancer Father    Diabetes Sister    Diabetes Brother    Heart disease Brother    Prostate cancer Brother    Colon cancer Maternal Uncle    Prostate cancer Brother    Bladder Cancer Brother    Leukemia Mother        all   Ovarian cancer Neg Hx    Kidney cancer Neg Hx     Prior to Admission medications   Medication Sig Start Date End Date Taking? Authorizing Provider  albuterol (VENTOLIN HFA) 108 (90 Base) MCG/ACT inhaler Inhale 2 puffs into the lungs every 4 (four) hours as needed for wheezing or  shortness of breath. 04/30/23 04/29/24 Yes [provider]  Azelastine HCl 137 MCG/SPRAY SOLN INHALE 1-2 PUFFS IN EACH NOSTRIL NASALLY ONCE A DAY 30 DAY(S)   Yes [provider]  cephALEXin (KEFLEX) 500 MG capsule Take 500 mg by mouth 3 (three) times Foster. 05/19/23  Yes [provider]  Cholecalciferol (VITAMIN D) 50 MCG (2000 UT) CAPS Take 2,000 Units by mouth Foster.   Yes [provider]  clobetasol ointment (TEMOVATE) 0.05 % Apply 1 application topically as needed (vaginal irritation).   Yes [provider]  clotrimazole (LOTRIMIN) 1 % cream Apply 1 Application topically 2 (two) times Foster as needed.   Yes [provider]  cyanocobalamin (,VITAMIN B-12,) 1000 MCG/ML injection 1,000 mcg every 30 (thirty) days. 04/23/19  Yes [provider]  dicyclomine  (BENTYL) 10 MG capsule Take 10 mg by mouth in the morning, at noon, and at bedtime. 05/16/19  Yes [provider]  docusate sodium (COLACE) 100 MG capsule Take 100 mg by mouth 3 (three) times Foster as needed for moderate constipation. 01/10/20  Yes [provider]  esomeprazole (NEXIUM) 40 MG capsule Take 40 mg by mouth 2 (two) times Foster before a meal.  02/08/16  Yes [provider]  fenofibrate 160 MG tablet Take 160 mg by mouth at bedtime.   Yes [provider]  gabapentin (NEURONTIN) 300 MG capsule Take 300 mg by mouth 2 (two) times Foster. 09/07/16 10/25/36 Yes Jaclyn Shaggy, MD  insulin degludec (TRESIBA FLEXTOUCH) 200 UNIT/ML FlexTouch Pen 200 Units at bedtime.   Yes [provider]  ipratropium (ATROVENT) 0.03 % nasal spray Place 1 spray into both nostrils 2 (two) times Foster as needed for rhinitis. 04/15/23 04/14/24 Yes [provider]  levocetirizine (XYZAL) 5 MG tablet Take 5 mg by mouth every evening.   Yes [provider]  levothyroxine (SYNTHROID, LEVOTHROID) 75 MCG tablet Take 75 mcg by mouth Foster before breakfast.    Yes [provider]  magnesium oxide (MAG-OX) 400 MG tablet Take 400 mg by mouth 2 (two) times Foster.    Yes [provider]  MOUNJARO 15 MG/0.5ML Pen Inject 15 mg into the skin once a week. 03/18/23  Yes [provider]  nadolol (CORGARD) 20 MG tablet Take 1 tablet (20 mg total) by mouth Foster. 12/20/22  Yes End, Cristal Deer, MD  NOVOLOG FLEXPEN 100 UNIT/ML FlexPen Inject 0-168 Units into the skin 3 (three) times Foster with meals. 04/12/23  Yes [provider]  potassium chloride SA (K-DUR,KLOR-CON) 20 MEQ tablet Take 20 mEq by mouth 2 (two) times Foster.    Yes [provider]  saccharomyces boulardii (FLORASTOR) 250 MG capsule Take 250 mg by mouth 2 (two) times Foster.    Yes [provider]  simvastatin (ZOCOR) 20 MG tablet Take 20 mg by mouth at bedtime.   Yes  [provider]  spironolactone (ALDACTONE) 25 MG tablet TAKE 1/2 TABLET BY MOUTH Foster Patient taking differently: Take 25 mg by mouth Foster. 05/07/23  Yes End, Cristal Deer, MD  torsemide (DEMADEX) 20 MG tablet Take 40 mg by mouth Foster.   Yes [provider]  valACYclovir (VALTREX) 500 MG tablet Take 500 mg by mouth 2 (two) times Foster as needed. 02/10/23  Yes [provider]  benzonatate (TESSALON) 100 MG capsule Take 100 mg by mouth 3 (three) times Foster as needed. Patient not taking: Reported on 05/21/2023 10/28/22   [provider]  NONFORMULARY OR COMPOUNDED ITEM Nifedipine 0.3%  plus lidocaine 2% cream apply to rectum BID and after every BM.    [provider]    Physical Exam: Vitals:   05/21/23 1830 05/21/23 1836 05/21/23 1919 05/21/23 2113  BP: 116/68  107/66 (!) 115/52  Pulse: 83  82 69  Resp: 19  17 19   Temp:   98.2 F (36.8 C) 97.8 F (36.6 C)  TempSrc:   Oral   SpO2: 99%  100% 100%  Weight:  72.1 kg  72.3 kg  Height:  5\' 3"  (1.6 m)  5\' 3"  (1.6 m)   Physical Exam  Labs on Admission: I have personally reviewed following labs and imaging studies  CBC: Recent Labs  Lab 05/20/23 1033 05/21/23 1555  WBC 1.6* 2.1*  NEUTROABS 0.8* 1.2*  HGB 10.0* 9.9*  HCT 31.3* 30.5*  MCV 98.4 98.4  PLT 52* 65*   Basic Metabolic Panel: Recent Labs  Lab 05/21/23 1555  NA 134*  K 2.7*  CL 97*  CO2 31  GLUCOSE 138*  BUN 17  CREATININE 0.93  CALCIUM 8.0*  MG 2.1   GFR: Estimated Creatinine Clearance: 44.5 mL/min (by C-G formula based on SCr of 0.93 mg/dL). Liver Function Tests: Recent Labs  Lab 05/21/23 1555  AST 42*  ALT 27  ALKPHOS 47  BILITOT 0.9  PROT 6.6  ALBUMIN 2.8*   No results for input(s): "LIPASE", "AMYLASE" in the last 168 hours. No results for input(s): "AMMONIA" in the last 168 hours. Coagulation Profile: Recent Labs  Lab 05/21/23 1555  INR 1.3*   Cardiac Enzymes: No results for input(s): "CKTOTAL",  "CKMB", "CKMBINDEX", "TROPONINI" in the last 168 hours. BNP (last 3 results) No results for input(s): "PROBNP" in the last 8760 hours. HbA1C: No results for input(s): "HGBA1C" in the last 72 hours. CBG: Recent Labs  Lab 05/21/23 2123  GLUCAP 129*   Lipid Profile: No results for input(s): "CHOL", "HDL", "LDLCALC", "TRIG", "CHOLHDL", "LDLDIRECT" in the last 72 hours. Thyroid Function Tests: No results for input(s): "TSH", "T4TOTAL", "FREET4", "T3FREE", "THYROIDAB" in the last 72 hours. Anemia Panel: No results for input(s): "VITAMINB12", "FOLATE", "FERRITIN", "TIBC", "IRON", "RETICCTPCT" in the last 72 hours. Urine analysis:    Component Value Date/Time   COLORURINE AMBER (A) 01/28/2023 2253   APPEARANCEUR HAZY (A) 01/28/2023 2253   APPEARANCEUR Cloudy (A) 07/08/2017 1103   LABSPEC 1.013 01/28/2023 2253   PHURINE 6.0 01/28/2023 2253   GLUCOSEU NEGATIVE 01/28/2023 2253   HGBUR MODERATE (A) 01/28/2023 2253   BILIRUBINUR NEGATIVE 01/28/2023 2253   BILIRUBINUR neg 10/10/2020 1112   BILIRUBINUR Negative 07/08/2017 1103   KETONESUR NEGATIVE 01/28/2023 2253   PROTEINUR NEGATIVE 01/28/2023 2253   UROBILINOGEN 0.2 10/10/2020 1112   NITRITE NEGATIVE 01/28/2023 2253   LEUKOCYTESUR LARGE (A) 01/28/2023 2253    Radiological Exams on Admission: CT ABDOMEN PELVIS W CONTRAST Result Date: 05/21/2023 CLINICAL DATA:  Abdominal pain.  Ascites.  Decreased hemoglobin. EXAM: CT ABDOMEN AND PELVIS WITH CONTRAST TECHNIQUE: Multidetector CT imaging of the abdomen and pelvis was performed using the standard protocol following bolus administration of intravenous contrast. RADIATION DOSE REDUCTION: This exam was performed according to the departmental dose-optimization program which includes automated exposure control, adjustment of the mA and/or kV according to patient size and/or use of iterative reconstruction technique. CONTRAST:  OMNIPAQUE IOHEXOL 300 MG/ML  SOLN COMPARISON:  CT dated 09/11/2022.  FINDINGS: Lower chest: Bibasilar atelectasis/scarring. Cardiac pacemaker noted. No intra-abdominal free air.  Moderate ascites. Hepatobiliary: Cirrhosis.  No biliary dilatation.  Cholecystectomy. Pancreas: Unremarkable.  No pancreatic ductal dilatation or surrounding inflammatory changes. Spleen: Mild splenomegaly measuring 16 cm in length. Adrenals/Urinary Tract: The adrenal glands are unremarkable. There is no hydronephrosis on either side. There is symmetric enhancement and excretion of contrast by both kidneys. The urinary bladder is grossly unremarkable. Stomach/Bowel: Mild sigmoid diverticulosis. Diffuse thickened appearance of the colon likely related to ascites and hepatic colopathy. Colitis is less likely. Clinical correlation is recommended. There is a small hiatal hernia. There is no bowel obstruction. Appendectomy. Vascular/Lymphatic: Moderate aortoiliac atherosclerotic disease. The IVC is unremarkable. No portal venous gas. [place] off a geode varices. No adenopathy. Reproductive: Hysterectomy. Other: Mild subcutaneous edema. Musculoskeletal: Osteopenia with degenerative changes. No acute osseous pathology. IMPRESSION: 1. Cirrhosis with moderate ascites and mild splenomegaly. 2. Mild sigmoid diverticulosis. No bowel obstruction. Electronically Signed   By: Elgie Collard M.D.   On: 05/21/2023 18:42     Data Reviewed: Relevant notes from primary care and specialist visits, past discharge summaries as available in EHR, including Care Everywhere. Prior diagnostic testing as pertinent to current admission diagnoses Updated medications and problem lists for reconciliation ED course, including vitals, labs, imaging, treatment and response to treatment Triage notes, nursing and pharmacy notes and ED provider's notes Notable results as noted in HPI   Assessment and Plan: * Decompensated nonalcoholic liver cirrhosis (HCC) Pancytopenia/thrombocytopenia/coagulopathy Esophageal varices s/p banding  4/1, portal hypertensive gastropathy Patient with ascites, pancytopenia, thrombocytopenia in spite of uptitrating outpatient diuretics No clinical evidence of cardiac encephalopathy IV Lasix, oral spironolactone, with BP parameters Continue nadolol Not currently on rifaximin or lactulose Foster weights with intake and output monitoring IR consult for paracentesis in the a.m. 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 Esophageal varices s/p banding 05/13/2023 Portal hypertensive gastropathy Melena x 1 with history of GI bleed 02/2023 secondary to duodenal angiectasia Chronic IDA on Venofer Melanotic stool x 1 in past week, now resolved Hemoglobin stable Monitor H&H Consider GI consult given melena x 1 following banding a week prior  Hypokalemia Likely related to increased diuretic regimen Received KCl 40 mg oral and 10 mill equivalents x 3 IV in the ED Continue to monitor and replete as needed  IDA (iron deficiency anemia) On Venofer  OSA on CPAP CPAP nightly  Insulin dependent type 2 diabetes mellitus (HCC) Sliding scale coverage.  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 [facility]) No acute issues suspected  Persistent atrial fibrillation (HCC) Not on systemic anticoagulation likely due to history of GI bleed Continue nadolol for rate control  Chronic heart failure with preserved ejection fraction (HFpEF) (HCC) Slightly volume overloaded likely related to cirrhosis versus CHF BNP slightly elevated at 200 EF 55 to 60% with G2 DD 09/2022 Continue diuretics  Hypothyroidism No acute issues continue levothyroxine        DVT prophylaxis: SCD  Consults: IR  Advance Care Planning:   Code Status: Full Code   Family Communication: none  Disposition Plan: Back to previous home environment  Severity of Illness: The appropriate patient status for this patient is INPATIENT. Inpatient status is  judged to be reasonable and necessary in order to provide the required intensity of service to ensure the patient's safety. The patient's presenting symptoms, physical exam findings, and initial radiographic and laboratory data in the context of their chronic comorbidities is felt to place them at high risk for further clinical deterioration. Furthermore, it is not anticipated that the patient will be medically stable for discharge from the hospital  within 2 midnights of admission.   * I certify that at the point of admission it is my clinical judgment that the patient will require inpatient hospital care spanning beyond 2 midnights from the point of admission due to high intensity of service, high risk for further deterioration and high frequency of surveillance required.*  Author: Andris Baumann, MD 05/21/2023 9:48 PM  For on call review [email or web address].

## 2023-05-21 NOTE — Assessment & Plan Note (Signed)
 Esophageal varices s/p banding 05/13/2023 Portal hypertensive gastropathy Melena x 1 with history of GI bleed 02/2023 secondary to duodenal angiectasia Chronic IDA on Venofer Melanotic stool x 1 in past week, now resolved Hemoglobin stable Monitor H&H Consider GI consult given melena x 1 following banding a week prior

## 2023-05-21 NOTE — ED Provider Notes (Signed)
 [facility] Provider Note    Event Date/Time   First MD Initiated Contact with Patient 05/21/23 1829     (approximate)   History   Leg Swelling and Bloated   HPI  Annette Foster is a 83 y.o. female with a history of stroke as well as CHF not on anticoagulation presents to the ER for evaluation of worsening abdominal distention despite being increased on spironolactone and Lasix over the past week.  Seen in clinic and told to come to the ER for paracentesis and likely admission.  She denies any worsening pain.      Physical Exam   Triage Vital Signs: ED Triage Vitals  Encounter Vitals Group     BP 05/21/23 1549 106/84     Systolic BP Percentile --      Diastolic BP Percentile --      Pulse Rate 05/21/23 1549 65     Resp 05/21/23 1549 20     Temp 05/21/23 1549 98.7 F (37.1 C)     Temp Source 05/21/23 1919 Oral     SpO2 05/21/23 1549 100 %     Weight 05/21/23 1836 159 lb (72.1 kg)     Height 05/21/23 1836 5\' 3"  (1.6 m)     Head Circumference --      Peak Flow --      Pain Score 05/21/23 1549 8     Pain Loc --      Pain Education --      Exclude from Growth Chart --     Most recent vital signs: Vitals:   05/21/23 1830 05/21/23 1919  BP: 116/68 107/66  Pulse: 83 82  Resp: 19 17  Temp:  98.2 F (36.8 C)  SpO2: 99% 100%     Constitutional: Alert  Eyes: Conjunctivae are normal.  Head: Atraumatic. Nose: No congestion/rhinnorhea. Mouth/Throat: Mucous membranes are moist.   Neck: Painless ROM.  Cardiovascular:   Good peripheral circulation. Respiratory: Normal respiratory effort.  No retractions.  Gastrointestinal: Quite distended.  Positive fluid wave.  No guarding or rebound. Musculoskeletal:  no deformity Neurologic:  MAE spontaneously. No gross focal neurologic deficits are appreciated.  Skin:  Skin is warm, dry and intact. No rash noted. Psychiatric: Mood and affect are normal. Speech and behavior are normal.    ED  Results / Procedures / Treatments   Labs (all labs ordered are listed, but only abnormal results are displayed) Labs Reviewed  CBC WITH DIFFERENTIAL/PLATELET - Abnormal; Notable for the following components:      Result Value   WBC 2.1 (*)    RBC 3.10 (*)    Hemoglobin 9.9 (*)    HCT 30.5 (*)    Platelets 65 (*)    Neutro Abs 1.2 (*)    Lymphs Abs 0.6 (*)    All other components within normal limits  COMPREHENSIVE METABOLIC PANEL WITH GFR - Abnormal; Notable for the following components:   Sodium 134 (*)    Potassium 2.7 (*)    Chloride 97 (*)    Glucose, Bld 138 (*)    Calcium 8.0 (*)    Albumin 2.8 (*)    AST 42 (*)    All other components within normal limits  BRAIN NATRIURETIC PEPTIDE - Abnormal; Notable for the following components:   B Natriuretic Peptide 200.2 (*)    All other components within normal limits  PROTIME-INR - Abnormal; Notable for the following components:   Prothrombin Time 15.9 (*)  INR 1.3 (*)    All other components within normal limits  APTT  MAGNESIUM  TROPONIN I (HIGH SENSITIVITY)  TROPONIN I (HIGH SENSITIVITY)     EKG     RADIOLOGY Please see ED Course for my review and interpretation.  I personally reviewed all radiographic images ordered to evaluate for the above acute complaints and reviewed radiology reports and findings.  These findings were personally discussed with the patient.  Please see medical record for radiology report.   PROCEDURES:  Critical Care performed: No  Procedures   MEDICATIONS ORDERED IN ED: Medications  potassium chloride 10 mEq in 100 mL IVPB (10 mEq Intravenous New Bag/Given 05/21/23 1917)  0.9 %  sodium chloride infusion ( Intravenous New Bag/Given 05/21/23 1916)  iohexol (OMNIPAQUE) 300 MG/ML solution 100 mL (100 mLs Intravenous Contrast Given 05/21/23 1653)  potassium chloride SA (KLOR-CON M) CR tablet 40 mEq (40 mEq Oral Given 05/21/23 1917)     IMPRESSION / MDM / ASSESSMENT AND PLAN / ED COURSE  I  reviewed the triage vital signs and the nursing notes.                              Differential diagnosis includes, but is not limited to, ascites, anasarca, CHF, SBO, colitis, mass  Patient presenting to the ER for evaluation of symptoms as described above.  Based on symptoms, risk factors and considered above differential, this presenting complaint could reflect a potentially life-threatening illness therefore the patient will be placed on continuous pulse oximetry and telemetry for monitoring.  Laboratory evaluation will be sent to evaluate for the above complaints.  CT imaging ordered out of triage on my review and interpretation with evidence of moderate to large ascites.  Patient also with hypokalemia.  Will replete potassium.  Given age risk factors and not having received paracentesis in the past we will consult hospitalist for admission for large-volume paracentesis and medication management.       FINAL CLINICAL IMPRESSION(S) / ED DIAGNOSES   Final diagnoses:  Other ascites     Rx / DC Orders   ED Discharge Orders     None        Note:  This document was prepared using Dragon voice recognition software and may include unintentional dictation errors.    Willy Eddy, MD 05/21/23 [phone]

## 2023-05-21 NOTE — Assessment & Plan Note (Signed)
 No acute issues suspected

## 2023-05-21 NOTE — Assessment & Plan Note (Signed)
 Sliding scale coverage.  Resume basal following procedure

## 2023-05-21 NOTE — ED Notes (Signed)
 Critical Result: Potassium 2.7  Roxan Hockey, MD aware

## 2023-05-21 NOTE — ED Provider Triage Note (Signed)
 Emergency Medicine Provider Triage Evaluation Note  Annette Foster , a 83 y.o. female  was evaluated in triage.  Pt complains of lower legs edema, abdominal distention.  Patient states hemoglobin dropped and they gave her iron infusion yesterday.  Patient starts getting ascites since Friday, states shortness of breath unable to walk.  Patient has history of murmur with valve replacement.  Patient states hematochezia last Friday.  Last bowel movement on Friday  Review of Systems  Positive:  Negative:   Physical Exam  BP 106/84   Pulse 65   Temp 98.7 F (37.1 C)   Resp 20   LMP  (LMP Unknown)   SpO2 100%  Gen:   Awake, no distress   Resp:  Normal effort  MSK:   Moves extremities without difficulty  Other:    Medical Decision Making  Medically screening exam initiated at 3:51 PM.  Appropriate orders placed.  [name] was informed that the remainder of the evaluation will be completed by another provider, this initial triage assessment does not replace that evaluation, and the importance of remaining in the ED until their evaluation is complete.  Patient with abdominal distention to include ascites, lower extremities edema grade 3, murmur, dyspnea.  Ordered CBC CMP abdominal CT with contrast after checking renal function   [name], PA-C 05/21/23 1553

## 2023-05-21 NOTE — Assessment & Plan Note (Signed)
 On Venofer

## 2023-05-21 NOTE — Assessment & Plan Note (Signed)
 CPAP nightly

## 2023-05-21 NOTE — Assessment & Plan Note (Signed)
 Likely related to intensified outpatient diuretic regimen Received KCl 40 mg oral and 10 mill equivalents x 3 IV in the ED Continue to monitor and replete as needed

## 2023-05-21 NOTE — Progress Notes (Signed)
 Patient follows at [facility] for IDA.  Here for iron infusion yesterday and concern of abdominal swelling was brought to clinical attention.  Dr. B wanted patient to be evaluated by [facility] today.  Patient is also followed by cardiology and hepatology.  The swelling started last week and continues to get worse.  Seen by PCP on Monday who increased diuretics.

## 2023-05-21 NOTE — Assessment & Plan Note (Signed)
 Slightly volume overloaded likely related to cirrhosis versus CHF BNP slightly elevated at 200 EF 55 to 60% with G2 DD 09/2022 Continue diuretics

## 2023-05-21 NOTE — Hospital Course (Signed)
 Annette Foster

## 2023-05-22 ENCOUNTER — Encounter: Payer: Self-pay | Admitting: Internal Medicine

## 2023-05-22 ENCOUNTER — Observation Stay

## 2023-05-22 DIAGNOSIS — K7460 Unspecified cirrhosis of liver: Secondary | ICD-10-CM | POA: Diagnosis not present

## 2023-05-22 DIAGNOSIS — K7290 Hepatic failure, unspecified without coma: Secondary | ICD-10-CM | POA: Diagnosis not present

## 2023-05-22 LAB — COMPREHENSIVE METABOLIC PANEL WITH GFR
ALT: 24 U/L (ref 0–44)
AST: 34 U/L (ref 15–41)
Albumin: 2.4 g/dL — ABNORMAL LOW (ref 3.5–5.0)
Alkaline Phosphatase: 30 U/L — ABNORMAL LOW (ref 38–126)
Anion gap: 8 (ref 5–15)
BUN: 17 mg/dL (ref 8–23)
CO2: 28 mmol/L (ref 22–32)
Calcium: 7.9 mg/dL — ABNORMAL LOW (ref 8.9–10.3)
Chloride: 99 mmol/L (ref 98–111)
Creatinine, Ser: 0.8 mg/dL (ref 0.44–1.00)
GFR, Estimated: >60 mL/min (ref >60)
Glucose, Bld: 86 mg/dL (ref 70–99)
Potassium: 3.1 mmol/L — ABNORMAL LOW (ref 3.5–5.1)
Sodium: 135 mmol/L (ref 135–145)
Total Bilirubin: 0.9 mg/dL (ref 0.0–1.2)
Total Protein: 5.5 g/dL — ABNORMAL LOW (ref 6.5–8.1)

## 2023-05-22 LAB — GLUCOSE, CAPILLARY
Glucose-Capillary: 101 mg/dL — ABNORMAL HIGH (ref 70–99)
Glucose-Capillary: 142 mg/dL — ABNORMAL HIGH (ref 70–99)
Glucose-Capillary: 149 mg/dL — ABNORMAL HIGH (ref 70–99)
Glucose-Capillary: 74 mg/dL (ref 70–99)
Glucose-Capillary: 80 mg/dL (ref 70–99)
Glucose-Capillary: 86 mg/dL (ref 70–99)
Glucose-Capillary: 86 mg/dL (ref 70–99)
Glucose-Capillary: 97 mg/dL (ref 70–99)

## 2023-05-22 LAB — BODY FLUID CELL COUNT WITH DIFFERENTIAL
Eos, Fluid: 1 %
Lymphs, Fluid: 63 %
Monocyte-Macrophage-Serous Fluid: 35 % — ABNORMAL LOW (ref 50–90)
Neutrophil Count, Fluid: 1 % (ref 0–25)
Total Nucleated Cell Count, Fluid: 155 uL (ref 0–1000)

## 2023-05-22 LAB — CBC
HCT: 25.7 % — ABNORMAL LOW (ref 36.0–46.0)
Hemoglobin: 8.6 g/dL — ABNORMAL LOW (ref 12.0–15.0)
MCH: 32.2 pg (ref 26.0–34.0)
MCHC: 33.5 g/dL (ref 30.0–36.0)
MCV: 96.3 fL (ref 80.0–100.0)
Platelets: 48 10*3/uL — ABNORMAL LOW (ref 150–400)
RBC: 2.67 MIL/uL — ABNORMAL LOW (ref 3.87–5.11)
RDW: 15.3 % (ref 11.5–15.5)
WBC: 1.4 10*3/uL — CL (ref 4.0–10.5)
nRBC: 0 % (ref 0.0–0.2)

## 2023-05-22 LAB — ALBUMIN, PLEURAL OR PERITONEAL FLUID: Albumin, Fluid: <1.5 g/dL

## 2023-05-22 LAB — PATHOLOGIST SMEAR REVIEW

## 2023-05-22 MED ORDER — SODIUM CHLORIDE 0.9 % IV BOLUS
500.0000 mL | Freq: Once | INTRAVENOUS | Status: DC
Start: 2023-05-22 — End: 2023-05-22

## 2023-05-22 MED ORDER — LORAZEPAM 2 MG/ML IJ SOLN
1.0000 mg | Freq: Once | INTRAMUSCULAR | Status: AC
  Administered 2023-05-22: 1 mg via INTRAVENOUS
  Filled 2023-05-22: qty 1

## 2023-05-22 MED ORDER — LIDOCAINE HCL (PF) 1 % IJ SOLN
10.0000 mL | Freq: Once | INTRAMUSCULAR | Status: AC
  Administered 2023-05-22: 10 mL

## 2023-05-22 MED ORDER — ALBUMIN HUMAN 25 % IV SOLN
25.0000 g | Freq: Once | INTRAVENOUS | Status: AC
  Administered 2023-05-22: 25 g via INTRAVENOUS
  Filled 2023-05-22: qty 100

## 2023-05-22 MED ORDER — SODIUM CHLORIDE 0.9 % IV BOLUS: 500.0000 mL | Freq: Once | INTRAVENOUS | Status: DC | PRN

## 2023-05-22 MED ORDER — POTASSIUM CHLORIDE CRYS ER 20 MEQ PO TBCR
40.0000 meq | EXTENDED_RELEASE_TABLET | Freq: Once | ORAL | Status: AC
  Administered 2023-05-22: 40 meq via ORAL
  Filled 2023-05-22: qty 2

## 2023-05-22 MED ORDER — SPIRONOLACTONE 25 MG PO TABS: 25.0000 mg | ORAL_TABLET | Freq: Every day | ORAL | 2 refills | Status: DC

## 2023-05-22 NOTE — Care Management Obs Status (Signed)
 MEDICARE OBSERVATION STATUS NOTIFICATION   Patient Details  Name: Annette Foster MRN: [id number] Date of Birth: Apr 27, 1940   Medicare Observation Status Notification Given:  Orland Dec, CMA 05/22/2023, 10:43 AM

## 2023-05-22 NOTE — Plan of Care (Signed)

## 2023-05-22 NOTE — Procedures (Signed)
 PROCEDURE SUMMARY:  Successful image-guided paracentesis from the right abdomen.  Yielded 4.8 liters of hazy amber fluid.  No immediate complications.  EBL: zero Patient tolerated well.   Specimen sent for labs.  Please see imaging section of Epic for full dictation.  Maxden Naji NP 05/22/2023 1:47 PM

## 2023-05-22 NOTE — Progress Notes (Signed)
 Patient requested "anxiety" mediation prior to paracentesis.  Dr Fran Lowes ordered Ativan as patient was being transported to ultrasound at 841.  Patient returned from ultrasound at 1020 in stable condition.  Patient reports increased anxiety, requesting medication.  Following Ativan administration, patient became difficult to arouse, Dr Fran Lowes notified, prn bolus ordered.  Patient demonstrates increased alertness through day.

## 2023-05-22 NOTE — TOC Transition Note (Signed)
 Transition of Care [facility]) - Discharge Note   Patient Details  Name: Annette Foster MRN: [id number] Date of Birth: [birth year]  [facility] CM/SW Contact:  Alesia Richards, RN 05/22/2023, 1:00 PM   Clinical Narrative:     Discharge orders noted for discharge to home/self care. CM to patient's room regarding discharge orders. Patient in recliner, very sleepy and difficult to arouse. RN Brooke at patient side, assessing vital signs. CM alert to Dr. Fran Lowes regarding patient status. CM to room, Dr. Fran Lowes at patient side. Per Dr. Fran Lowes will cancel discharge.   Final next level of care: Home/Self Care Barriers to Discharge: No Barriers Identified   Patient Goals and CMS Choice    Home/self care  Discharge Placement               Home/self care    Discharge Plan and Services Additional resources added to the After Visit Summary for                                       Social Drivers of Health (SDOH) Interventions SDOH Screenings   Food Insecurity: No Food Insecurity (05/21/2023)  Housing: Low Risk  (05/21/2023)  Transportation Needs: No Transportation Needs (05/21/2023)  Utilities: Not At Risk (05/21/2023)  Financial Resource Strain: Medium Risk (05/02/2023)   Received from [facility]  Physical Activity: Unknown (01/30/2018)   Received from [facility], [facility]  Social Connections: Socially Integrated (05/21/2023)  Tobacco Use: Low Risk  (05/21/2023)     Readmission Risk Interventions     No data to display

## 2023-05-22 NOTE — Progress Notes (Signed)
  PROGRESS NOTE    Annette Foster  ZOX:[id number] DOB: [birth year] DOA: 05/21/2023 PCP: Jaclyn Shaggy, MD  [id number]-AA  LOS: 0 days   Brief hospital course:   Assessment & Plan: Annette Foster is a 82 y.o. 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 Melanotic stool x 1 in past week, now resolved Hemoglobin stable since 3/30, however was low at around 7 on 3/21, treated with Venofer --Monitor Hgb  Hypokalemia --monitor and supplement PRN  IDA (iron deficiency anemia) On Venofer as outpatient  OSA on CPAP CPAP nightly  Insulin dependent type 2 diabetes mellitus (HCC) --ACHS and SSI --hold long-acting insulin for now  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) No acute issues suspected  Persistent atrial fibrillation (HCC) Not on systemic anticoagulation likely due to history of GI bleed Continue nadolol for rate control  Chronic heart failure with preserved ejection fraction (HFpEF) (HCC) Slightly volume overloaded likely related  to cirrhosis +/- CHF BNP slightly elevated at 200 EF 55 to 60% with G2 DD 09/2022 --cont spironolactone  Hypothyroidism No acute issues  continue levothyroxine  Hypotension, chronic --NS 500 bolus PRN for systolic <100 or MAP <55   DVT prophylaxis: SCD/Compression stockings Code Status: Full code  Family Communication:  Level of care: Telemetry Medical Dispo:   The patient is from: home Anticipated d/c is to: home Anticipated d/c date is: tomorrow   Subjective and Interval History:  Pt reported dyspnea improved after paracentesis today.  Pt requested anxiety medicine for paracentesis, but received it after paracentesis, and became very sleepy.   Objective: Vitals:   05/22/23 1307 05/22/23 1312 05/22/23 1322 05/22/23 1543  BP: (!) 105/49 (!) 86/33 (!) 101/42 (!) 106/50  Pulse: 69  66 66  Resp: 16   18  Temp:    98 F (36.7 C)  TempSrc:      SpO2: 97%  98% 97%  Weight:      Height:        Intake/Output Summary (Last 24 hours) at 05/22/2023 1836 Last data filed at 05/21/2023 2239 Gross per 24 hour  Intake 573.83 ml  Output --  Net 573.83 ml   Filed Weights   05/21/23 1836 05/21/23 2113  Weight: 72.1 kg 72.3 kg    Examination: before ativan  Constitutional: NAD, AAOx3 HEENT: conjunctivae and lids normal, EOMI CV: No cyanosis.   RESP: normal respiratory effort, on RA Neuro: II - XII grossly intact.   Psych: Normal mood and affect.  Appropriate judgement and reason   Data Reviewed: I have personally reviewed labs and imaging studies  Time spent: 50 minutes  Darlin Priestly, MD [facility] If 7PM-7AM, please contact night-coverage 05/22/2023, 6:36 PM

## 2023-05-23 DIAGNOSIS — K7460 Unspecified cirrhosis of liver: Secondary | ICD-10-CM | POA: Diagnosis not present

## 2023-05-23 DIAGNOSIS — K7290 Hepatic failure, unspecified without coma: Secondary | ICD-10-CM | POA: Diagnosis not present

## 2023-05-23 LAB — BASIC METABOLIC PANEL WITH GFR
Anion gap: 5 (ref 5–15)
BUN: 17 mg/dL (ref 8–23)
CO2: 29 mmol/L (ref 22–32)
Calcium: 8.3 mg/dL — ABNORMAL LOW (ref 8.9–10.3)
Chloride: 103 mmol/L (ref 98–111)
Creatinine, Ser: 0.96 mg/dL (ref 0.44–1.00)
GFR, Estimated: 59 mL/min — ABNORMAL LOW (ref >60)
Glucose, Bld: 113 mg/dL — ABNORMAL HIGH (ref 70–99)
Potassium: 3 mmol/L — ABNORMAL LOW (ref 3.5–5.1)
Sodium: 137 mmol/L (ref 135–145)

## 2023-05-23 LAB — CBC
HCT: 25.5 % — ABNORMAL LOW (ref 36.0–46.0)
Hemoglobin: 8.4 g/dL — ABNORMAL LOW (ref 12.0–15.0)
MCH: 32.2 pg (ref 26.0–34.0)
MCHC: 32.9 g/dL (ref 30.0–36.0)
MCV: 97.7 fL (ref 80.0–100.0)
Platelets: 45 10*3/uL — ABNORMAL LOW (ref 150–400)
RBC: 2.61 MIL/uL — ABNORMAL LOW (ref 3.87–5.11)
RDW: 15.5 % (ref 11.5–15.5)
WBC: 1.4 10*3/uL — CL (ref 4.0–10.5)
nRBC: 0 % (ref 0.0–0.2)

## 2023-05-23 LAB — GLUCOSE, CAPILLARY
Glucose-Capillary: 106 mg/dL — ABNORMAL HIGH (ref 70–99)
Glucose-Capillary: 99 mg/dL (ref 70–99)

## 2023-05-23 LAB — MAGNESIUM: Magnesium: 2.5 mg/dL — ABNORMAL HIGH (ref 1.7–2.4)

## 2023-05-23 MED ORDER — POTASSIUM CHLORIDE CRYS ER 20 MEQ PO TBCR
40.0000 meq | EXTENDED_RELEASE_TABLET | Freq: Once | ORAL | Status: AC
  Administered 2023-05-23: 40 meq via ORAL
  Filled 2023-05-23: qty 2

## 2023-05-23 NOTE — Plan of Care (Signed)

## 2023-05-23 NOTE — Discharge Summary (Signed)
 Physician Discharge Summary   Annette Foster  female DOB: Apr 20, 1940  UJW:[id number]  PCP: Jaclyn Shaggy, MD  Admit date: 05/21/2023 Discharge date: 05/23/2023  Admitted From: home Disposition:  home CODE STATUS: Full code  Discharge Instructions     Diet - low sodium heart healthy   Complete by: As directed       Hospital Course:  For full details, please see H&P, progress notes, consult notes and ancillary notes.  Briefly,  Annette Foster is a 83 y.o. 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    * Decompensated nonalcoholic liver cirrhosis (HCC) Pancytopenia/thrombocytopenia/coagulopathy Esophageal varices s/p banding 4/1, portal hypertensive gastropathy Patient with ascites, pancytopenia, thrombocytopenia in spite of recent uptitrating outpatient diuretics No clinical evidence of hepatic encephalopathy --paracentesis on 4/10 with 4.8L removed.  IV albumin 25g given. --discharged on home torsemide 40 mg daily and spironolactone 25 mg daily.   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)   Insulin dependent type 2 diabetes mellitus (HCC)   OSA on CPAP   IDA (iron deficiency anemia)     Discharge Instructions:  Allergies as of 05/23/2023       Reactions   Codeine Itching, Nausea And Vomiting, Other (See Comments)   Demeclocycline Rash   Demerol [meperidine] Itching, Nausea And Vomiting   Hydrocodone Itching, Nausea And Vomiting   Morphine Nausea Only   Other Other (See Comments)   Oxycodone Itching, Nausea And Vomiting   Pentazocine Itching, Nausea And Vomiting, Other (See Comments)   Tetracyclines & Related Rash   Coal Tar Extract Other (See Comments)   Hydrocodone-acetaminophen Other (See Comments)   Salicylic Acid Other (See Comments)   Tetracycline Hcl Other (See Comments)        Medication List  STOP taking these medications    benzonatate 100 MG capsule Commonly known as: TESSALON   cephALEXin 500 MG capsule Commonly known as: KEFLEX    NovoLOG FlexPen 100 UNIT/ML FlexPen Generic drug: insulin aspart       TAKE these medications    albuterol 108 (90 Base) MCG/ACT inhaler Commonly known as: VENTOLIN HFA Inhale 2 puffs into the lungs every 4 (four) hours as needed for wheezing or shortness of breath.   Azelastine HCl 137 MCG/SPRAY Soln INHALE 1-2 PUFFS IN EACH NOSTRIL NASALLY ONCE A DAY 30 DAY(S)   clobetasol ointment 0.05 % Commonly known as: TEMOVATE Apply 1 application topically as needed (vaginal irritation).   clotrimazole 1 % cream Commonly known as: LOTRIMIN Apply 1 Application topically 2 (two) times daily as needed.   cyanocobalamin 1000 MCG/ML injection Commonly known as: VITAMIN B12 1,000 mcg every 30 (thirty) days.   dicyclomine 10 MG capsule Commonly known as: BENTYL Take 10 mg by mouth in the morning, at noon, and at bedtime.   docusate sodium 100 MG capsule Commonly known as: COLACE Take 100 mg by mouth 3 (three) times daily as needed for moderate constipation.   esomeprazole 40 MG capsule Commonly known as: NEXIUM Take 40 mg by mouth 2 (two) times daily before a meal.   fenofibrate 160 MG tablet Take 160 mg by mouth at bedtime.   gabapentin 300 MG capsule Commonly known as: NEURONTIN Take 300 mg by mouth 2 (two) times daily.   ipratropium 0.03 % nasal spray Commonly known as: ATROVENT Place 1 spray into both nostrils 2 (two) times daily as needed for rhinitis.   levocetirizine 5 MG tablet Commonly known as: XYZAL Take 5 mg by mouth every evening.   levothyroxine 75 MCG tablet Commonly known as: SYNTHROID Take 75 mcg by mouth daily before breakfast.   magnesium oxide 400 MG tablet Commonly known as: MAG-OX Take 400 mg by mouth 2 (two) times daily.   Mounjaro 15 MG/0.5ML Pen Generic drug: tirzepatide Inject 15 mg into the skin once a week.   nadolol 20 MG tablet Commonly known as: CORGARD Take 1 tablet (20 mg total) by mouth daily.   NONFORMULARY OR COMPOUNDED  ITEM Nifedipine 0.3% plus lidocaine 2% cream apply to rectum BID and after every BM.   potassium chloride SA 20 MEQ tablet Commonly known as: KLOR-CON M Take 20 mEq by mouth 2 (two) times daily.   saccharomyces boulardii 250 MG capsule Commonly known as: FLORASTOR Take 250 mg by mouth 2 (two) times daily.   simvastatin 20 MG tablet Commonly known as: ZOCOR Take 20 mg by mouth at bedtime.   spironolactone 25 MG tablet Commonly known as: ALDACTONE Take 1 tablet (25 mg total) by mouth daily.   torsemide 20 MG tablet Commonly known as: DEMADEX Take 40 mg by mouth daily.   Evaristo Bury FlexTouch 200 UNIT/ML FlexTouch Pen Generic drug: insulin degludec 200 Units at bedtime.   valACYclovir 500 MG tablet Commonly known as: VALTREX Take 500 mg by mouth 2 (two) times daily as needed.   Vitamin D 50 MCG (2000 UT) Caps Take 2,000 Units by mouth daily.         Follow-up Information     Jaclyn Shaggy, MD Follow up in 1 week(s).   Specialty: Internal Medicine Why: Hospital follow up Contact information: [address]   [place] [address] [phone]         Regis Bill, MD Follow up in 2 week(s).  Specialty: Gastroenterology Contact information: [address] [place] [address] [phone]                 Allergies  Allergen Reactions   Codeine Itching, Nausea And Vomiting and Other (See Comments)   Demeclocycline Rash   Demerol [Meperidine] Itching and Nausea And Vomiting   Hydrocodone Itching and Nausea And Vomiting   Morphine Nausea Only   Other Other (See Comments)   Oxycodone Itching and Nausea And Vomiting   Pentazocine Itching, Nausea And Vomiting and Other (See Comments)   Tetracyclines & Related Rash   Coal Tar Extract Other (See Comments)   Hydrocodone-Acetaminophen Other (See Comments)   Salicylic Acid Other (See Comments)   Tetracycline Hcl Other (See Comments)     The results of significant diagnostics from  this hospitalization (including imaging, microbiology, ancillary and laboratory) are listed below for reference.   Consultations:   Procedures/Studies: US Paracentesis Result Date: 05/22/2023 INDICATION: 83 year old female with nonalcoholic liver cirrhosis with new ascites. First encounter with IR for paracentesis today. EXAM: ULTRASOUND GUIDED THERAPEUTIC AND DIAGNOSTIC PARACENTESIS MEDICATIONS: 9 mL 1% lidocaine COMPLICATIONS: None immediate. PROCEDURE: Informed written consent was obtained from the patient after a discussion of the risks, benefits and alternatives to treatment. A timeout was performed prior to the initiation of the procedure. Initial ultrasound scanning demonstrates a large amount of ascites within the right abdomen. The right lower abdomen was prepped and draped in the usual sterile fashion. 1% lidocaine was used for local anesthesia. Following this, a 19 gauge, 7-cm, Yueh catheter was introduced. An ultrasound image was saved for documentation purposes. The paracentesis was performed. The catheter was removed and a dressing was applied. The patient tolerated the procedure well without immediate post procedural complication. Patient received post-procedure intravenous albumin; see nursing notes for details. FINDINGS: A total of approximately4.8 liters of hazy amber fluid was removed. Samples were sent to the laboratory as requested by the clinical team. IMPRESSION: Successful ultrasound-guided paracentesis yielding 4.8 liters of peritoneal fluid. PLAN: If the patient eventually requires >/=2 paracenteses in a 30 day period, candidacy for formal evaluation by the [facility] will be assessed. Performed by Carlton Adam, NP Electronically Signed   By: Gilmer Mor D.O.   On: 05/22/2023 14:16   CT ABDOMEN PELVIS W CONTRAST Result Date: 05/21/2023 CLINICAL DATA:  Abdominal pain.  Ascites.  Decreased hemoglobin. EXAM: CT ABDOMEN AND  PELVIS WITH CONTRAST TECHNIQUE: Multidetector CT imaging of the abdomen and pelvis was performed using the standard protocol following bolus administration of intravenous contrast. RADIATION DOSE REDUCTION: This exam was performed according to the departmental dose-optimization program which includes automated exposure control, adjustment of the mA and/or kV according to patient size and/or use of iterative reconstruction technique. CONTRAST:  OMNIPAQUE IOHEXOL 300 MG/ML  SOLN COMPARISON:  CT dated 09/11/2022. FINDINGS: Lower chest: Bibasilar atelectasis/scarring. Cardiac pacemaker noted. No intra-abdominal free air.  Moderate ascites. Hepatobiliary: Cirrhosis.  No biliary dilatation.  Cholecystectomy. Pancreas: Unremarkable. No pancreatic ductal dilatation or surrounding inflammatory changes. Spleen: Mild splenomegaly measuring 16 cm in length. Adrenals/Urinary Tract: The adrenal glands are unremarkable. There is no hydronephrosis on either side. There is symmetric enhancement and excretion of contrast by both kidneys. The urinary bladder is grossly unremarkable. Stomach/Bowel: Mild sigmoid diverticulosis. Diffuse thickened appearance of the colon likely related to ascites and hepatic colopathy. Colitis is less likely. Clinical correlation is recommended. There is a small hiatal hernia. There is no bowel obstruction. Appendectomy.  Vascular/Lymphatic: Moderate aortoiliac atherosclerotic disease. The IVC is unremarkable. No portal venous gas. [place] off a geode varices. No adenopathy. Reproductive: Hysterectomy. Other: Mild subcutaneous edema. Musculoskeletal: Osteopenia with degenerative changes. No acute osseous pathology. IMPRESSION: 1. Cirrhosis with moderate ascites and mild splenomegaly. 2. Mild sigmoid diverticulosis. No bowel obstruction. Electronically Signed   By: Elgie Collard M.D.   On: 05/21/2023 18:42   CUP PACEART INCLINIC DEVICE CHECK Result Date: 05/20/2023 Normal in-clinic dual chamber  pacemaker check. Presenting Rhythm: APVP . Routine testing of thresholds, sensing, and impedance demonstrate stable parameters and no programming changes needed at this time. 20 episodes of AT with longest lasting 1 day and the next longest 10.5 hours. No OAC. Overall burden 5%. Estimated longevity 6.3 years . Pt enrolled in remote follow-up. Changes made this session and as follows. - RA output increased to 1.9V - Program count increased from 13 to 14 - Changes made per CL .Raj Janus, RN     Labs: BNP (last 3 results) Recent Labs    05/21/23 1555  BNP 200.2*   Basic Metabolic Panel: Recent Labs  Lab 05/21/23 1555 05/22/23 0533 05/23/23 0526  NA 134* 135 137  K 2.7* 3.1* 3.0*  CL 97* 99 103  CO2 31 28 29   GLUCOSE 138* 86 113*  BUN 17 17 17   CREATININE 0.93 0.80 0.96  CALCIUM 8.0* 7.9* 8.3*  MG 2.1  --  2.5*   Liver Function Tests: Recent Labs  Lab 05/21/23 1555 05/22/23 0533  AST 42* 34  ALT 27 24  ALKPHOS 47 30*  BILITOT 0.9 0.9  PROT 6.6 5.5*  ALBUMIN 2.8* 2.4*   No results for input(s): "LIPASE", "AMYLASE" in the last 168 hours. No results for input(s): "AMMONIA" in the last 168 hours. CBC: Recent Labs  Lab 05/20/23 1033 05/21/23 1555 05/22/23 0533 05/23/23 0526  WBC 1.6* 2.1* 1.4* 1.4*  NEUTROABS 0.8* 1.2*  --   --   HGB 10.0* 9.9* 8.6* 8.4*  HCT 31.3* 30.5* 25.7* 25.5*  MCV 98.4 98.4 96.3 97.7  PLT 52* 65* 48* 45*   Cardiac Enzymes: No results for input(s): "CKTOTAL", "CKMB", "CKMBINDEX", "TROPONINI" in the last 168 hours. BNP: Invalid input(s): "POCBNP" CBG: Recent Labs  Lab 05/22/23 1141 05/22/23 1544 05/22/23 2029 05/22/23 2348 05/23/23 0426  GLUCAP 142* 86 80 74 106*   D-Dimer No results for input(s): "DDIMER" in the last 72 hours. Hgb A1c No results for input(s): "HGBA1C" in the last 72 hours. Lipid Profile No results for input(s): "CHOL", "HDL", "LDLCALC", "TRIG", "CHOLHDL", "LDLDIRECT" in the last 72 hours. Thyroid function  studies No results for input(s): "TSH", "T4TOTAL", "T3FREE", "THYROIDAB" in the last 72 hours.  Invalid input(s): "FREET3" Anemia work up No results for input(s): "VITAMINB12", "FOLATE", "FERRITIN", "TIBC", "IRON", "RETICCTPCT" in the last 72 hours. Urinalysis    Component Value Date/Time   COLORURINE AMBER (A) 01/28/2023 2253   APPEARANCEUR HAZY (A) 01/28/2023 2253   APPEARANCEUR Cloudy (A) 07/08/2017 1103   LABSPEC 1.013 01/28/2023 2253   PHURINE 6.0 01/28/2023 2253   GLUCOSEU NEGATIVE 01/28/2023 2253   HGBUR MODERATE (A) 01/28/2023 2253   BILIRUBINUR NEGATIVE 01/28/2023 2253   BILIRUBINUR neg 10/10/2020 1112   BILIRUBINUR Negative 07/08/2017 1103   KETONESUR NEGATIVE 01/28/2023 2253   PROTEINUR NEGATIVE 01/28/2023 2253   UROBILINOGEN 0.2 10/10/2020 1112   NITRITE NEGATIVE 01/28/2023 2253   LEUKOCYTESUR LARGE (A) 01/28/2023 2253   Sepsis Labs Recent Labs  Lab 05/20/23 1033 05/21/23 1555 05/22/23 0533 05/23/23 0526  WBC 1.6* 2.1* 1.4* 1.4*   Microbiology Recent Results (from the past 240 hours)  Body fluid culture w Gram Stain     Status: None (Preliminary result)   Collection Time: 05/22/23  9:34 AM   Specimen: PATH Cytology Peritoneal fluid  Result Value Ref Range Status   Specimen Description   Final    PERITONEAL Performed at [facility], [address] [place], [place] [address]    Special Requests   Final    NONE Performed at [facility], [address] [place], [place] [address]    Gram Stain   Final    NO WBC SEEN NO ORGANISMS SEEN Performed at [facility], [address] [place], [place] [address]    Culture PENDING  Incomplete   Report Status PENDING  Incomplete     Total time spend on discharging this patient, including the last patient exam, discussing the hospital stay, instructions for ongoing care as it relates to all pertinent caregivers, as well as preparing the medical discharge records, prescriptions, and/or  referrals as applicable, is 35 minutes.    Darlin Priestly, MD  [facility] 05/23/2023, 7:46 AM

## 2023-05-24 ENCOUNTER — Encounter: Payer: Self-pay | Admitting: Cardiology

## 2023-05-25 LAB — BODY FLUID CULTURE W GRAM STAIN
Culture: NO GROWTH
Gram Stain: NONE SEEN

## 2023-05-30 ENCOUNTER — Emergency Department

## 2023-05-30 ENCOUNTER — Other Ambulatory Visit: Payer: Self-pay

## 2023-05-30 ENCOUNTER — Observation Stay
Admission: EM | Admit: 2023-05-30 | Discharge: 2023-05-31 | Disposition: A | Discharge status: Home / Self Care | Attending: Internal Medicine | Admitting: Internal Medicine

## 2023-05-30 DIAGNOSIS — E119 Type 2 diabetes mellitus without complications: Secondary | ICD-10-CM

## 2023-05-30 DIAGNOSIS — K7290 Hepatic failure, unspecified without coma: Secondary | ICD-10-CM | POA: Diagnosis present

## 2023-05-30 DIAGNOSIS — F419 Anxiety disorder, unspecified: Secondary | ICD-10-CM | POA: Diagnosis present

## 2023-05-30 DIAGNOSIS — K7460 Unspecified cirrhosis of liver: Secondary | ICD-10-CM | POA: Diagnosis not present

## 2023-05-30 DIAGNOSIS — E663 Overweight: Secondary | ICD-10-CM | POA: Diagnosis present

## 2023-05-30 DIAGNOSIS — I5032 Chronic diastolic (congestive) heart failure: Secondary | ICD-10-CM | POA: Diagnosis present

## 2023-05-30 DIAGNOSIS — Z853 Personal history of malignant neoplasm of breast: Secondary | ICD-10-CM | POA: Insufficient documentation

## 2023-05-30 DIAGNOSIS — Z96652 Presence of left artificial knee joint: Secondary | ICD-10-CM | POA: Insufficient documentation

## 2023-05-30 DIAGNOSIS — Z950 Presence of cardiac pacemaker: Secondary | ICD-10-CM | POA: Diagnosis not present

## 2023-05-30 DIAGNOSIS — I110 Hypertensive heart disease with heart failure: Secondary | ICD-10-CM | POA: Diagnosis not present

## 2023-05-30 DIAGNOSIS — R140 Abdominal distension (gaseous): Secondary | ICD-10-CM | POA: Diagnosis present

## 2023-05-30 DIAGNOSIS — Z79899 Other long term (current) drug therapy: Secondary | ICD-10-CM | POA: Insufficient documentation

## 2023-05-30 DIAGNOSIS — Z6828 Body mass index (BMI) 28.0-28.9, adult: Secondary | ICD-10-CM | POA: Insufficient documentation

## 2023-05-30 DIAGNOSIS — E785 Hyperlipidemia, unspecified: Secondary | ICD-10-CM | POA: Diagnosis not present

## 2023-05-30 DIAGNOSIS — Z794 Long term (current) use of insulin: Secondary | ICD-10-CM | POA: Insufficient documentation

## 2023-05-30 DIAGNOSIS — D61818 Other pancytopenia: Secondary | ICD-10-CM | POA: Diagnosis not present

## 2023-05-30 DIAGNOSIS — E876 Hypokalemia: Secondary | ICD-10-CM | POA: Diagnosis present

## 2023-05-30 DIAGNOSIS — I2510 Atherosclerotic heart disease of native coronary artery without angina pectoris: Secondary | ICD-10-CM | POA: Diagnosis not present

## 2023-05-30 DIAGNOSIS — G4733 Obstructive sleep apnea (adult) (pediatric): Secondary | ICD-10-CM

## 2023-05-30 DIAGNOSIS — E039 Hypothyroidism, unspecified: Secondary | ICD-10-CM | POA: Diagnosis not present

## 2023-05-30 DIAGNOSIS — R188 Other ascites: Secondary | ICD-10-CM | POA: Diagnosis not present

## 2023-05-30 LAB — PROTIME-INR
INR: 1.3 — ABNORMAL HIGH (ref 0.8–1.2)
Prothrombin Time: 16.7 s — ABNORMAL HIGH (ref 11.4–15.2)

## 2023-05-30 LAB — BODY FLUID CELL COUNT WITH DIFFERENTIAL
Lymphs, Fluid: 89 %
Monocyte-Macrophage-Serous Fluid: 8 %
Neutrophil Count, Fluid: 3 %
Total Nucleated Cell Count, Fluid: 164 uL

## 2023-05-30 LAB — COMPREHENSIVE METABOLIC PANEL WITH GFR
ALT: 26 U/L (ref 0–44)
AST: 41 U/L (ref 15–41)
Albumin: 3.1 g/dL — ABNORMAL LOW (ref 3.5–5.0)
Alkaline Phosphatase: 53 U/L (ref 38–126)
Anion gap: 4 — ABNORMAL LOW (ref 5–15)
BUN: 16 mg/dL (ref 8–23)
CO2: 28 mmol/L (ref 22–32)
Calcium: 8.1 mg/dL — ABNORMAL LOW (ref 8.9–10.3)
Chloride: 102 mmol/L (ref 98–111)
Creatinine, Ser: 0.94 mg/dL (ref 0.44–1.00)
GFR, Estimated: >60 mL/min (ref >60)
Glucose, Bld: 119 mg/dL — ABNORMAL HIGH (ref 70–99)
Potassium: 2.9 mmol/L — ABNORMAL LOW (ref 3.5–5.1)
Sodium: 134 mmol/L — ABNORMAL LOW (ref 135–145)
Total Bilirubin: 1.1 mg/dL (ref 0.0–1.2)
Total Protein: 6.6 g/dL (ref 6.5–8.1)

## 2023-05-30 LAB — CBC
HCT: 30.6 % — ABNORMAL LOW (ref 36.0–46.0)
Hemoglobin: 10 g/dL — ABNORMAL LOW (ref 12.0–15.0)
MCH: 31.8 pg (ref 26.0–34.0)
MCHC: 32.7 g/dL (ref 30.0–36.0)
MCV: 97.5 fL (ref 80.0–100.0)
Platelets: 75 10*3/uL — ABNORMAL LOW (ref 150–400)
RBC: 3.14 MIL/uL — ABNORMAL LOW (ref 3.87–5.11)
RDW: 15.2 % (ref 11.5–15.5)
WBC: 2.2 10*3/uL — ABNORMAL LOW (ref 4.0–10.5)
nRBC: 0 % (ref 0.0–0.2)

## 2023-05-30 LAB — LIPASE, BLOOD: Lipase: 40 U/L (ref 11–51)

## 2023-05-30 LAB — MAGNESIUM: Magnesium: 2.2 mg/dL (ref 1.7–2.4)

## 2023-05-30 LAB — ALBUMIN, PLEURAL OR PERITONEAL FLUID: Albumin, Fluid: <1.5 g/dL

## 2023-05-30 LAB — PROTEIN, PLEURAL OR PERITONEAL FLUID: Total protein, fluid: <3 g/dL

## 2023-05-30 LAB — GLUCOSE, CAPILLARY: Glucose-Capillary: 174 mg/dL — ABNORMAL HIGH (ref 70–99)

## 2023-05-30 LAB — LACTATE DEHYDROGENASE: LDH: 232 U/L — ABNORMAL HIGH (ref 98–192)

## 2023-05-30 LAB — AMMONIA: Ammonia: 42 umol/L — ABNORMAL HIGH (ref 9–35)

## 2023-05-30 LAB — PHOSPHORUS: Phosphorus: 2.1 mg/dL — ABNORMAL LOW (ref 2.5–4.6)

## 2023-05-30 LAB — BRAIN NATRIURETIC PEPTIDE: B Natriuretic Peptide: 181.6 pg/mL — ABNORMAL HIGH (ref 0.0–100.0)

## 2023-05-30 LAB — APTT: aPTT: 32 s (ref 24–36)

## 2023-05-30 MED ORDER — FENOFIBRATE 160 MG PO TABS
160.0000 mg | ORAL_TABLET | Freq: Every day | ORAL | Status: DC
  Administered 2023-05-30: 160 mg via ORAL
  Filled 2023-05-30: qty 1

## 2023-05-30 MED ORDER — FENTANYL CITRATE PF 50 MCG/ML IJ SOSY
12.5000 ug | PREFILLED_SYRINGE | INTRAMUSCULAR | Status: DC | PRN
  Administered 2023-05-31: 12.5 ug via INTRAVENOUS
  Filled 2023-05-30: qty 1

## 2023-05-30 MED ORDER — DM-GUAIFENESIN ER 30-600 MG PO TB12: 1.0000 | ORAL_TABLET | Freq: Two times a day (BID) | ORAL | Status: DC | PRN

## 2023-05-30 MED ORDER — AZELASTINE HCL 0.1 % NA SOLN
1.0000 | Freq: Every day | NASAL | Status: DC
  Filled 2023-05-30 (×2): qty 30

## 2023-05-30 MED ORDER — ONDANSETRON HCL 4 MG/2ML IJ SOLN: 4.0000 mg | Freq: Three times a day (TID) | INTRAMUSCULAR | Status: DC | PRN

## 2023-05-30 MED ORDER — VITAMIN D 25 MCG (1000 UNIT) PO TABS
2000.0000 [IU] | ORAL_TABLET | Freq: Every day | ORAL | Status: DC
  Administered 2023-05-31: 2000 [IU] via ORAL
  Filled 2023-05-30: qty 2

## 2023-05-30 MED ORDER — SIMVASTATIN 10 MG PO TABS
20.0000 mg | ORAL_TABLET | Freq: Every day | ORAL | Status: DC
  Administered 2023-05-30: 20 mg via ORAL
  Filled 2023-05-30: qty 2

## 2023-05-30 MED ORDER — ACETAMINOPHEN 325 MG PO TABS
325.0000 mg | ORAL_TABLET | Freq: Four times a day (QID) | ORAL | Status: DC | PRN
  Administered 2023-05-31: 325 mg via ORAL
  Filled 2023-05-30: qty 1

## 2023-05-30 MED ORDER — ALPRAZOLAM 0.25 MG PO TABS
0.2500 mg | ORAL_TABLET | Freq: Two times a day (BID) | ORAL | Status: DC | PRN
  Administered 2023-05-30: 0.25 mg via ORAL
  Filled 2023-05-30: qty 1

## 2023-05-30 MED ORDER — HYDRALAZINE HCL 20 MG/ML IJ SOLN: 5.0000 mg | INTRAMUSCULAR | Status: DC | PRN

## 2023-05-30 MED ORDER — DOCUSATE SODIUM 100 MG PO CAPS: 100.0000 mg | ORAL_CAPSULE | Freq: Three times a day (TID) | ORAL | Status: DC | PRN

## 2023-05-30 MED ORDER — INSULIN ASPART 100 UNIT/ML IJ SOLN
0.0000 [IU] | Freq: Three times a day (TID) | INTRAMUSCULAR | Status: DC
  Administered 2023-05-31: 1 [IU] via SUBCUTANEOUS
  Filled 2023-05-30: qty 1

## 2023-05-30 MED ORDER — INSULIN ASPART 100 UNIT/ML IJ SOLN: 0.0000 [IU] | Freq: Every day | INTRAMUSCULAR | Status: DC

## 2023-05-30 MED ORDER — POTASSIUM CHLORIDE CRYS ER 20 MEQ PO TBCR
40.0000 meq | EXTENDED_RELEASE_TABLET | Freq: Once | ORAL | Status: AC
  Administered 2023-05-30: 40 meq via ORAL
  Filled 2023-05-30: qty 2

## 2023-05-30 MED ORDER — DICYCLOMINE HCL 10 MG PO CAPS
10.0000 mg | ORAL_CAPSULE | Freq: Three times a day (TID) | ORAL | Status: DC
  Administered 2023-05-31 (×2): 10 mg via ORAL
  Filled 2023-05-30 (×3): qty 1

## 2023-05-30 MED ORDER — SACCHAROMYCES BOULARDII 250 MG PO CAPS
250.0000 mg | ORAL_CAPSULE | Freq: Two times a day (BID) | ORAL | Status: DC
  Administered 2023-05-31: 250 mg via ORAL
  Filled 2023-05-30: qty 1

## 2023-05-30 MED ORDER — GABAPENTIN 300 MG PO CAPS
300.0000 mg | ORAL_CAPSULE | Freq: Two times a day (BID) | ORAL | Status: DC
  Administered 2023-05-30 – 2023-05-31 (×2): 300 mg via ORAL
  Filled 2023-05-30 (×2): qty 1

## 2023-05-30 MED ORDER — POTASSIUM CHLORIDE 10 MEQ/100ML IV SOLN
10.0000 meq | Freq: Once | INTRAVENOUS | Status: AC
  Administered 2023-05-30: 10 meq via INTRAVENOUS
  Filled 2023-05-30: qty 100

## 2023-05-30 MED ORDER — MAGNESIUM OXIDE 400 MG PO TABS
400.0000 mg | ORAL_TABLET | Freq: Two times a day (BID) | ORAL | Status: DC
  Administered 2023-05-31: 400 mg via ORAL
  Filled 2023-05-30 (×2): qty 1

## 2023-05-30 MED ORDER — SODIUM CHLORIDE 0.9 % IV SOLN: INTRAVENOUS | Status: DC | PRN

## 2023-05-30 MED ORDER — ALBUTEROL SULFATE (2.5 MG/3ML) 0.083% IN NEBU: 2.5000 mg | INHALATION_SOLUTION | RESPIRATORY_TRACT | Status: DC | PRN

## 2023-05-30 MED ORDER — LEVOTHYROXINE SODIUM 50 MCG PO TABS
75.0000 ug | ORAL_TABLET | Freq: Every day | ORAL | Status: DC
  Administered 2023-05-31: 75 ug via ORAL
  Filled 2023-05-30: qty 1

## 2023-05-30 MED ORDER — PANTOPRAZOLE SODIUM 40 MG PO TBEC
40.0000 mg | DELAYED_RELEASE_TABLET | Freq: Two times a day (BID) | ORAL | Status: DC
  Administered 2023-05-30 – 2023-05-31 (×2): 40 mg via ORAL
  Filled 2023-05-30 (×2): qty 1

## 2023-05-30 MED ORDER — ALBUMIN HUMAN 25 % IV SOLN
25.0000 g | Freq: Once | INTRAVENOUS | Status: AC
  Administered 2023-05-31: 25 g via INTRAVENOUS
  Filled 2023-05-30: qty 100

## 2023-05-30 MED ORDER — INSULIN GLARGINE-YFGN 100 UNIT/ML ~~LOC~~ SOLN
150.0000 [IU] | Freq: Every day | SUBCUTANEOUS | Status: DC
  Administered 2023-05-30: 150 [IU] via SUBCUTANEOUS
  Filled 2023-05-30: qty 1.5

## 2023-05-30 MED ORDER — MIDODRINE HCL 5 MG PO TABS
5.0000 mg | ORAL_TABLET | Freq: Three times a day (TID) | ORAL | Status: DC
  Administered 2023-05-30 – 2023-05-31 (×3): 5 mg via ORAL
  Filled 2023-05-30 (×3): qty 1

## 2023-05-30 NOTE — ED Notes (Signed)
 Assumed care of patient. Patient resting comfortably on stretcher. Family at bedside. Vitals assessed.

## 2023-05-30 NOTE — ED Notes (Signed)
 MD Wells at the bedside performing paracentesis

## 2023-05-30 NOTE — ED Provider Notes (Signed)
 [facility] Provider Note    Event Date/Time   First MD Initiated Contact with Patient 05/30/23 1739     (approximate)   History   Shortness of Breath   HPI Annette Foster is a 83 y.o. female with history of DM2, HFpEF, HTN, hypothyroidism, OSA on CPAP, nonalcoholic liver cirrhosis, pancytopenia, esophageal varices presenting today for abdominal distention and shortness of breath.  Patient was admitted to the hospital 9 days ago for similar symptoms.  This was the first time she has had large volume ascites which required paracentesis.  She has already had complete reaccumulation of the fluid within the past 7 days since the procedure was done.  Difficulty tolerating p.o. due to distention.  Having severe abdominal pain again.  Told by her GI team to come into the ED for further evaluation as she likely will need readmission for repeat therapeutic paracentesis and ongoing monitoring of her labs.  Having some shortness of breath related to the abdominal distention.  Otherwise denies fever, cough, congestion, chest pain.  She has been on torsemide  and spironolactone  but still having fluid accumulation.  Chart review: Patient recently admitted for similar symptoms.  Had paracentesis with 5 L pulled off.     Physical Exam   Triage Vital Signs: ED Triage Vitals  Encounter Vitals Group     BP 05/30/23 1734 (!) 87/69     Systolic BP Percentile --      Diastolic BP Percentile --      Pulse Rate 05/30/23 1734 70     Resp 05/30/23 1734 (!) 22     Temp 05/30/23 1734 98 F (36.7 C)     Temp src --      SpO2 05/30/23 1734 100 %     Weight 05/30/23 1736 159 lb (72.1 kg)     Height 05/30/23 1736 5\' 3"  (1.6 m)     Head Circumference --      Peak Flow --      Pain Score 05/30/23 1733 5     Pain Loc --      Pain Education --      Exclude from Growth Chart --     Most recent vital signs: Vitals:   05/30/23 1830 05/30/23 1920  BP: (!) 139/111 (!) 95/51   Pulse: 71 79  Resp: 18 17  Temp:  98.2 F (36.8 C)  SpO2: 100% 95%   Physical Exam: I have reviewed the vital signs and nursing notes. General: Awake, alert, no acute distress.  Nontoxic appearing. Head:  Atraumatic, normocephalic.   ENT:  EOM intact, PERRL. Oral mucosa is pink and moist with no lesions. Neck: Neck is supple with full range of motion, No meningeal signs. Cardiovascular:  RRR, No murmurs. Peripheral pulses palpable and equal bilaterally. Respiratory:  Symmetrical chest wall expansion.  No rhonchi, rales, or wheezes.  Good air movement throughout.  No use of accessory muscles.   Musculoskeletal:  No cyanosis or edema. Moving extremities with full ROM Abdomen:  Soft, significant abdominal distention with tenderness palpation throughout the abdomen Neuro:  GCS 15, moving all four extremities, interacting appropriately. Speech clear. Psych:  Calm, appropriate.   Skin:  Warm, dry, no rash.    ED Results / Procedures / Treatments   Labs (all labs ordered are listed, but only abnormal results are displayed) Labs Reviewed  CBC - Abnormal; Notable for the following components:      Result Value   WBC 2.2 (*)  RBC 3.14 (*)    Hemoglobin 10.0 (*)    HCT 30.6 (*)    Platelets 75 (*)    All other components within normal limits  COMPREHENSIVE METABOLIC PANEL WITH GFR - Abnormal; Notable for the following components:   Sodium 134 (*)    Potassium 2.9 (*)    Glucose, Bld 119 (*)    Calcium 8.1 (*)    Albumin  3.1 (*)    Anion gap 4 (*)    All other components within normal limits  BODY FLUID CELL COUNT WITH DIFFERENTIAL - Abnormal; Notable for the following components:   Color, Fluid AMBER (*)    Appearance, Fluid CLOUDY (*)    All other components within normal limits  PROTIME-INR - Abnormal; Notable for the following components:   Prothrombin Time 16.7 (*)    INR 1.3 (*)    All other components within normal limits  BRAIN NATRIURETIC PEPTIDE - Abnormal; Notable  for the following components:   B Natriuretic Peptide 181.6 (*)    All other components within normal limits  BODY FLUID CULTURE W GRAM STAIN  LIPASE, BLOOD  PROTEIN, PLEURAL OR PERITONEAL FLUID  ALBUMIN , PLEURAL OR PERITONEAL FLUID   APTT  CYTOLOGY - NON PAP     EKG My EKG interpretation: Rate of 69, AV dual paced rhythm.  Normal axis, normal intervals.  No acute ST elevations or depressions   RADIOLOGY Independently interpreted x-ray with no acute pathology   PROCEDURES:  Critical Care performed: No  Paracentesis  Date/Time: 05/30/2023 6:21 PM  Performed by: Kandee Orion, MD Authorized by: Kandee Orion, MD   Consent:    Consent obtained:  Written   Consent given by:  Patient   Risks, benefits, and alternatives were discussed: yes     Risks discussed:  Bleeding, bowel perforation, infection and pain   Alternatives discussed:  No treatment Universal protocol:    Procedure explained and questions answered to patient or proxy's satisfaction: yes     Relevant documents present and verified: yes     Test results available: yes     Imaging studies available: yes     Required blood products, implants, devices, and special equipment available: yes     Site/side marked: yes     Immediately prior to procedure, a time out was called: yes     Patient identity confirmed:  Arm band and verbally with patient Pre-procedure details:    Procedure purpose:  Diagnostic   Preparation: Patient was prepped and draped in usual sterile fashion   Anesthesia:    Anesthesia method:  Local infiltration   Local anesthetic:  Lidocaine  1% w/o epi Procedure details:    Needle gauge:  20   Ultrasound guidance: yes     Puncture site:  L lower quadrant   Fluid removed amount:  50cc   Fluid characteristics: pink/clear.   Dressing:  Adhesive bandage Post-procedure details:    Procedure completion:  Tolerated well, no immediate complications    MEDICATIONS ORDERED IN ED: Medications   potassium chloride  10 mEq in 100 mL IVPB (has no administration in time range)     IMPRESSION / MDM / ASSESSMENT AND PLAN / ED COURSE  I reviewed the triage vital signs and the nursing notes.                              Differential diagnosis includes, but is not limited to, ascites from cirrhosis,  SBP, significant fluid reaccumulation, heart failure, pneumonia  Patient's presentation is most consistent with acute presentation with potential threat to life or bodily function.  Patient is an 83 year old female presenting today for rapid reaccumulation of ascites with significant abdominal pain and distention.  Exam shows significant distention and tenderness throughout.  Did have recent CT with no obvious acute findings and do not see indication to repeat as most of her symptoms have recurred as her abdominal distention has returned.  Will perform diagnostic paracentesis to rule out SBP in the setting of recent paracentesis.  Paracentesis shows no sign of SBP.  Laboratory workup with leukocytosis and thrombocytopenia rather consistent with her baseline.  Also has mild hypokalemia which we will give IV repletion for.  Otherwise her hepatic labs and lipase look normal today.  Chest x-ray unremarkable.  Patient has severe difficulty ambulating secondary to her abdominal distention causing shortness of breath.  Will admit to hospitalist service for therapeutic IR paracentesis in the morning given her rapid reaccumulation.  The patient is on the cardiac monitor to evaluate for evidence of arrhythmia and/or significant heart rate changes. Clinical Course as of 05/30/23 2100  Fri May 30, 2023  2005 Peritoneal fluid cell count with differential(!) No SBP [DW]    Clinical Course User Index [DW] Kandee Orion, MD     FINAL CLINICAL IMPRESSION(S) / ED DIAGNOSES   Final diagnoses:  Other ascites  Hypokalemia     Rx / DC Orders   ED Discharge Orders     None        Note:  This  document was prepared using Dragon voice recognition software and may include unintentional dictation errors.   Kandee Orion, MD 05/30/23 2100

## 2023-05-30 NOTE — H&P (Addendum)
 History and Physical    Annette Foster ZOX:[id number] DOB: Dec 01, 1940 DOA: 05/30/2023  Referring MD/NP/PA:   PCP: Westley Hammers, MD   Patient coming from:  The patient is coming from home.     Chief Complaint: Abdominal distention and shortness of breath.  HPI: Annette Foster is a 83 y.o. 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  Patient was recently hospitalized from 4/9 - 4/11 due to worsening ascites.  Patient had paracentesis with 4.8 L fluid removed.  She states that she has progressively worsening abdominal distention in the past several days.  No nausea, vomiting, diarrhea or abdominal pain.  She reports abdominal tightness and chest tightness.  She has SOB, no cough, fever or chills.  No symptoms of UTI.  No dark stool or rectal bleeding.  Patient is very anxious and crying during interview.  EDP did diagnostic paracentesis in ED, fluid analysis is not consistent with SBP, with total nucleated cells 164 and neutrophil 3%.    Data reviewed independently and ED Course: pt was found to have pancytopenia with hemoglobin 10.0, WBC 2.2, platelet 75 (patient had hemoglobin 8.4, WBC 1.4, platelet 45 on 4 /11/25), INR 1.3, PTT 32, GFR> 60, BNP 181, potassium 2.9.  Blood pressure soft 87/69, 95/51, 100/61, heart rate 79, RR 17, oxygen saturation 95% on room air.  Chest x-ray showed cardiomegaly without infiltration.  Patient is placed in telemetry bed for observation.   EKG: I have personally reviewed.  Paced rhythm, QTc 492   Review of Systems:   General: no fevers, chills, no body weight  gain, has fatigue HEENT: no blurry vision, hearing changes or sore throat Respiratory: has dyspnea, no coughing, wheezing CV: no chest pain, no palpitations GI: no nausea, vomiting, diarrhea, constipation.  Has abdominal distention, abdominal tightness. GU: no dysuria, burning on urination, increased urinary frequency, hematuria  Ext: no leg edema Neuro: no unilateral weakness, numbness, or tingling, no vision change or hearing loss Skin: no rash, no skin tear. MSK: No muscle spasm, no deformity, no limitation of range of movement in spin Heme: No easy bruising.  Travel history: No recent long distant travel.   Allergy:  Allergies  Allergen Reactions   Codeine Itching, Nausea And Vomiting and Other (See Comments)   Demeclocycline Rash   Demerol [Meperidine] Itching and Nausea And Vomiting   Hydrocodone Itching and Nausea And Vomiting   Morphine  Nausea Only   Other Other (See Comments)   Oxycodone  Itching and Nausea And Vomiting   Pentazocine Itching, Nausea And Vomiting and Other (See Comments)   Tetracyclines & Related Rash   Coal Tar Extract Other (See Comments)   Hydrocodone-Acetaminophen  Other (See Comments)   Salicylic Acid Other (See Comments)   Tetracycline Hcl Other (See Comments)    Past Medical History:  Diagnosis Date   Abdominal pain    Allergy    Cancer (HCC) 2017   breast cancer- Left   Cataract    CHF (congestive heart failure) (HCC)    Collagenous colitis    Coronary artery disease    Diabetes mellitus without complication (HCC)    Diarrhea    Diverticulosis    Dysrhythmia    Fatty  liver disease, nonalcoholic    Fibrocystic breast    GERD (gastroesophageal reflux disease)    Heart murmur    Hyperlipidemia    Hypertension    Hypothyroidism    IBS (irritable bowel syndrome)    IDA (iron  deficiency anemia)    Liver cirrhosis (HCC)    Personal history of radiation therapy 2017   LEFT BREAST CA   PONV (postoperative nausea and vomiting)     Presence of permanent cardiac pacemaker    Sleep apnea    C-Pap    Past Surgical History:  Procedure Laterality Date   ABDOMINAL HYSTERECTOMY     [place] bso   ABDOMINAL SURGERY     APPENDECTOMY     AV NODE ABLATION N/A 05/17/2021   Procedure: AV NODE ABLATION;  Surgeon: Boyce Byes, MD;  [facility]: [facility];  Service: Cardiovascular;  Laterality: N/A;   BREAST BIOPSY Bilateral 2016   negative   BREAST 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/2015   DCIS, papillary carcinoma in situ   BREAST BIOPSY Left 05/27/2016   BENIGN MAMMARY EPITHELIUM   BREAST BIOPSY Left 11/20/2017   affirm bx x clip BENIGN MAMMARY EPITHELIUM CONSISTENT WITH RAD THERAPY   BREAST EXCISIONAL BIOPSY Right    NEG 1980's   BREAST LUMPECTOMY Left 10/17/2015   DCIS and papillary carcinoma insitu, clear margins   CARDIAC SURGERY     has replacement valve   CATARACT EXTRACTION Right    CATARACT EXTRACTION W/PHACO Left 01/16/2021   Procedure: CATARACT EXTRACTION PHACO AND INTRAOCULAR LENS PLACEMENT (IOC) LEFT DIABETIC 8.46 00:59.8;  Surgeon: Clair Crews, MD;  Location: [facility];  Service: Ophthalmology;  Laterality: Left;   CHOLECYSTECTOMY     COLONOSCOPY WITH PROPOFOL  N/A 03/11/2016   Procedure: COLONOSCOPY WITH PROPOFOL ;  Surgeon: Cassie Click, MD;  Location: [facility] ENDOSCOPY;  Service: Endoscopy;  Laterality: N/A;   COLONOSCOPY WITH PROPOFOL  N/A 02/18/2020   Procedure: COLONOSCOPY WITH PROPOFOL ;  Surgeon: Marnee Sink, MD;  Location: [facility] ENDOSCOPY;  Service: Endoscopy;  Laterality: N/A;   COLONOSCOPY WITH PROPOFOL  N/A 09/10/2022   Procedure: COLONOSCOPY WITH PROPOFOL ;  Surgeon: Luke Salaam, MD;  Location: [facility] ENDOSCOPY;  Service: Gastroenterology;  Laterality: N/A;   ESOPHAGEAL BANDING  09/10/2022   Procedure: ESOPHAGEAL BANDING;  Surgeon: Luke Salaam, MD;  Location: [facility] ENDOSCOPY;  Service: Gastroenterology;;   ESOPHAGEAL BANDING  10/17/2022   Procedure: ESOPHAGEAL BANDING;  Surgeon: Marnee Sink, MD;   Location: [facility] ENDOSCOPY;  Service: Endoscopy;;   ESOPHAGEAL BANDING  12/24/2022   Procedure: ESOPHAGEAL BANDING;  Surgeon: Marnee Sink, MD;  Location: [facility] ENDOSCOPY;  Service: Endoscopy;;   ESOPHAGEAL BANDING  05/13/2023   Procedure: ESOPHAGOSCOPY, WITH ESOPHAGEAL VARICES BAND LIGATION;  Surgeon: Marnee Sink, MD;  Location: [facility] ENDOSCOPY;  Service: Endoscopy;;   ESOPHAGOGASTRODUODENOSCOPY N/A 05/13/2023   Procedure: EGD (ESOPHAGOGASTRODUODENOSCOPY);  Surgeon: Marnee Sink, MD;  Location: [facility] ENDOSCOPY;  Service: Endoscopy;  Laterality: N/A;   ESOPHAGOGASTRODUODENOSCOPY (EGD) WITH PROPOFOL  N/A 03/11/2016   Procedure: ESOPHAGOGASTRODUODENOSCOPY (EGD) WITH PROPOFOL ;  Surgeon: Cassie Click, MD;  Location: [facility] ENDOSCOPY;  Service: Endoscopy;  Laterality: N/A;   ESOPHAGOGASTRODUODENOSCOPY (EGD) WITH PROPOFOL  N/A 02/18/2020   Procedure: ESOPHAGOGASTRODUODENOSCOPY (EGD) WITH PROPOFOL ;  Surgeon: Marnee Sink, MD;  Location: [facility] ENDOSCOPY;  Service: Endoscopy;  Laterality: N/A;   ESOPHAGOGASTRODUODENOSCOPY (EGD) WITH PROPOFOL  N/A 04/25/2020   Procedure: ESOPHAGOGASTRODUODENOSCOPY (EGD) WITH PROPOFOL ;  Surgeon: Marnee Sink, MD;  Location: [facility] ENDOSCOPY;  Service: Endoscopy;  Laterality: N/A;   ESOPHAGOGASTRODUODENOSCOPY (EGD) WITH PROPOFOL  N/A 05/23/2020   Procedure:  ESOPHAGOGASTRODUODENOSCOPY (EGD) WITH PROPOFOL ;  Surgeon: Marnee Sink, MD;  Location: [facility] ENDOSCOPY;  Service: Endoscopy;  Laterality: N/A;   ESOPHAGOGASTRODUODENOSCOPY (EGD) WITH PROPOFOL  N/A 09/10/2022   Procedure: ESOPHAGOGASTRODUODENOSCOPY (EGD) WITH PROPOFOL ;  Surgeon: Luke Salaam, MD;  Location: [facility] ENDOSCOPY;  Service: Gastroenterology;  Laterality: N/A;   ESOPHAGOGASTRODUODENOSCOPY (EGD) WITH PROPOFOL  N/A 10/17/2022   Procedure: ESOPHAGOGASTRODUODENOSCOPY (EGD) WITH PROPOFOL ;  Surgeon: Marnee Sink, MD;  Location: [facility] ENDOSCOPY;  Service: Endoscopy;  Laterality: N/A;   ESOPHAGOGASTRODUODENOSCOPY (EGD) WITH PROPOFOL  N/A  12/24/2022   Procedure: ESOPHAGOGASTRODUODENOSCOPY (EGD) WITH PROPOFOL ;  Surgeon: Marnee Sink, MD;  Location: [facility] ENDOSCOPY;  Service: Endoscopy;  Laterality: N/A;   ESOPHAGOGASTRODUODENOSCOPY (EGD) WITH PROPOFOL  N/A 01/29/2023   Procedure: ESOPHAGOGASTRODUODENOSCOPY (EGD) WITH PROPOFOL ;  Surgeon: Shane Darling, MD;  Location: [facility] ENDOSCOPY;  Service: Endoscopy;  Laterality: N/A;  Ideally we would intubate if possible given concern for varices   EUS N/A 10/04/2021   Procedure: LOWER ENDOSCOPIC ULTRASOUND (EUS);  Surgeon: Rayford Cake, MD;  Location: [facility] ENDOSCOPY;  Service: Gastroenterology;  Laterality: N/A;  [facility]   EYE SURGERY     FINGER ARTHROSCOPY WITH CARPOMETACARPEL ([facility]) ARTHROPLASTY Right 09/03/2018   Procedure: CARPOMETACARPEL [facility]) ARTHROPLASTY RIGHT THUMB;  Surgeon: Molli Angelucci, MD;  Location: [facility] ORS;  Service: Orthopedics;  Laterality: Right;   FLEXIBLE SIGMOIDOSCOPY N/A 09/14/2021   Procedure: FLEXIBLE SIGMOIDOSCOPY;  Surgeon: Marnee Sink, MD;  Location: [facility] ENDOSCOPY;  Service: Endoscopy;  Laterality: N/A;  No anesthesia   GANGLION CYST EXCISION Right 09/03/2018   Procedure: REMOVAL GANGLION OF WRIST;  Surgeon: Molli Angelucci, MD;  Location: [facility] ORS;  Service: Orthopedics;  Laterality: Right;   HARDWARE REMOVAL Right 09/03/2018   Procedure: HARDWARE REMOVAL RIGHT THUMB;  Surgeon: Molli Angelucci, MD;  Location: [facility] ORS;  Service: Orthopedics;  Laterality: Right;  staple removed   HEMOSTASIS CLIP PLACEMENT  09/10/2022   Procedure: HEMOSTASIS CLIP PLACEMENT;  Surgeon: Luke Salaam, MD;  Location: [facility] ENDOSCOPY;  Service: Gastroenterology;;   HEMOSTASIS CONTROL  09/10/2022   Procedure: HEMOSTASIS CONTROL;  Surgeon: Luke Salaam, MD;  Location: [facility] ENDOSCOPY;  Service: Gastroenterology;;   HEMOSTASIS CONTROL  01/29/2023   Procedure: HEMOSTASIS CONTROL;  Surgeon: Shane Darling, MD;  Location: [facility] ENDOSCOPY;  Service: Endoscopy;;   JOINT REPLACEMENT Left     TKR   left sinusplasty      MASTECTOMY, PARTIAL Left 10/17/2015   Procedure: MASTECTOMY PARTIAL REVISION;  Surgeon: Benancio Bracket, MD;  Location: [facility] ORS;  Service: General;  Laterality: Left;   PACEMAKER IMPLANT N/A 03/23/2021   Procedure: PACEMAKER IMPLANT;  Surgeon: Boyce Byes, MD;  [facility];  Service: Cardiovascular;  Laterality: N/A;   PARTIAL MASTECTOMY WITH NEEDLE LOCALIZATION Left 09/29/2015   Procedure: PARTIAL MASTECTOMY WITH NEEDLE LOCALIZATION;  Surgeon: Benancio Bracket, MD;  Location: [facility] ORS;  Service: General;  Laterality: Left;   POLYPECTOMY  09/10/2022   Procedure: POLYPECTOMY;  Surgeon: Luke Salaam, MD;  Location: [facility] ENDOSCOPY;  Service: Gastroenterology;;   RECTAL EXAM UNDER ANESTHESIA N/A 10/05/2021   Procedure: RECTAL EXAM UNDER ANESTHESIA, ASPIRATION OF RECTAL CYST;  Surgeon: Eldred Grego, MD;  Location: [facility] ORS;  Service: General;  Laterality: N/A;   TOTAL ABDOMINAL HYSTERECTOMY W/ BILATERAL SALPINGOOPHORECTOMY      Social History:  reports that she has never smoked. She has never used smokeless tobacco. She reports that she does not drink alcohol and does not use drugs.  Family History:  Family History  Problem Relation Age of Onset  Breast cancer Paternal Grandmother    Colon cancer Father    Diabetes Sister    Diabetes Brother    Heart disease Brother    Prostate cancer Brother    Colon cancer Maternal Uncle    Prostate cancer Brother    Bladder Cancer Brother    Leukemia Mother        all   Ovarian cancer Neg Hx    Kidney cancer Neg Hx      Prior to Admission medications   Medication Sig Start Date End Date Taking? Authorizing Provider  albuterol  (VENTOLIN  HFA) 108 (90 Base) MCG/ACT inhaler Inhale 2 puffs into the lungs every 4 (four) hours as needed for wheezing or shortness of breath. 04/30/23 04/29/24  [provider]  Azelastine  HCl 137 MCG/SPRAY SOLN INHALE 1-2 PUFFS IN EACH NOSTRIL  NASALLY ONCE A DAY 30 DAY(S)    [provider]  Cholecalciferol  (VITAMIN D ) 50 MCG (2000 UT) CAPS Take 2,000 Units by mouth daily.    [provider]  clobetasol  ointment (TEMOVATE ) 0.05 % Apply 1 application topically as needed (vaginal irritation).    [provider]  clotrimazole  (LOTRIMIN ) 1 % cream Apply 1 Application topically 2 (two) times daily as needed.    [provider]  cyanocobalamin (,VITAMIN B-12,) 1000 MCG/ML injection 1,000 mcg every 30 (thirty) days. 04/23/19   [provider]  dicyclomine  (BENTYL ) 10 MG capsule Take 10 mg by mouth in the morning, at noon, and at bedtime. 05/16/19   [provider]  docusate sodium  (COLACE) 100 MG capsule Take 100 mg by mouth 3 (three) times daily as needed for moderate constipation. 01/10/20   [provider]  esomeprazole (NEXIUM) 40 MG capsule Take 40 mg by mouth 2 (two) times daily before a meal.  02/08/16   [provider]  fenofibrate  160 MG tablet Take 160 mg by mouth at bedtime.    [provider]  gabapentin  (NEURONTIN ) 300 MG capsule Take 300 mg by mouth 2 (two) times daily. 09/07/16 10/25/36  Westley Hammers, MD  insulin  degludec (TRESIBA  FLEXTOUCH) 200 UNIT/ML FlexTouch Pen 200 Units at bedtime.    [provider]  ipratropium (ATROVENT) 0.03 % nasal spray Place 1 spray into both nostrils 2 (two) times daily as needed for rhinitis. 04/15/23 04/14/24  [provider]  levocetirizine (XYZAL) 5 MG tablet Take 5 mg by mouth every evening.    [provider]  levothyroxine  (SYNTHROID , LEVOTHROID) 75 MCG tablet Take 75 mcg by mouth daily before breakfast.     [provider]  magnesium  oxide (MAG-OX) 400 MG tablet Take 400 mg by mouth 2 (two) times daily.     [provider]  MOUNJARO  15 MG/0.5ML Pen Inject 15 mg into the skin once a week. 03/18/23   [provider]  nadolol  (CORGARD ) 20 MG tablet Take 1 tablet (20 mg  total) by mouth daily. 12/20/22   End, Veryl Gottron, MD  NONFORMULARY OR COMPOUNDED ITEM Nifedipine 0.3% plus lidocaine  2% cream apply to rectum BID and after every BM.    [provider]  potassium chloride  SA (K-DUR,KLOR-CON ) 20 MEQ tablet Take 20 mEq by mouth 2 (two) times daily.     [provider]  saccharomyces boulardii (FLORASTOR) 250 MG capsule Take 250 mg by mouth 2 (two) times daily.     [provider]  simvastatin  (ZOCOR ) 20 MG tablet Take 20 mg by mouth at bedtime.    [provider]  spironolactone  (ALDACTONE )  25 MG tablet Take 1 tablet (25 mg total) by mouth daily. 05/22/23   Garrison Kanner, MD  torsemide  (DEMADEX ) 20 MG tablet Take 40 mg by mouth daily.    [provider]  valACYclovir  (VALTREX ) 500 MG tablet Take 500 mg by mouth 2 (two) times daily as needed. 02/10/23   [provider]    Physical Exam: Vitals:   05/30/23 1736 05/30/23 1830 05/30/23 1920 05/30/23 2135  BP:  (!) 139/111 (!) 95/51 108/60  Pulse:  71 79 81  Resp:  18 17 17   Temp:   98.2 F (36.8 C) 98.1 F (36.7 C)  TempSrc:   Oral Oral  SpO2:  100% 95% 97%  Weight: 72.1 kg     Height: 5\' 3"  (1.6 m)      General: Not in acute distress HEENT:       Eyes: PERRL, EOMI, no jaundice       ENT: No discharge from the ears and nose, no pharynx injection, no tonsillar enlargement.        Neck: No JVD, no bruit, no mass felt. Heme: No neck lymph node enlargement. Cardiac: S1/S2, RRR, No gallops or rubs. Respiratory: No rales, wheezing, rhonchi or rubs. GI: Soft, nontender, no rebound pain, no organomegaly, BS present.  Has severe abdominal distention. GU: No hematuria Ext: No pitting leg edema bilaterally. 1+DP/PT pulse bilaterally. Musculoskeletal: No joint deformities, No joint redness or warmth, no limitation of ROM in spin. Skin: No rashes.  Neuro: Alert, oriented X3, cranial nerves II-XII grossly intact, moves all extremities normally.  Psych: Patient is  not psychotic, no suicidal or hemocidal ideation.  Labs on Admission: I have personally reviewed following labs and imaging studies  CBC: Recent Labs  Lab 05/30/23 1809  WBC 2.2*  HGB 10.0*  HCT 30.6*  MCV 97.5  PLT 75*   Basic Metabolic Panel: Recent Labs  Lab 05/30/23 1809  NA 134*  K 2.9*  CL 102  CO2 28  GLUCOSE 119*  BUN 16  CREATININE 0.94  CALCIUM 8.1*   GFR: Estimated Creatinine Clearance: 43.9 mL/min (by C-G formula based on SCr of 0.94 mg/dL). Liver Function Tests: Recent Labs  Lab 05/30/23 1809  AST 41  ALT 26  ALKPHOS 53  BILITOT 1.1  PROT 6.6  ALBUMIN  3.1*   Recent Labs  Lab 05/30/23 1809  LIPASE 40   No results for input(s): "AMMONIA" in the last 168 hours. Coagulation Profile: Recent Labs  Lab 05/30/23 1809  INR 1.3*   Cardiac Enzymes: No results for input(s): "CKTOTAL", "CKMB", "CKMBINDEX", "TROPONINI" in the last 168 hours. BNP (last 3 results) No results for input(s): "PROBNP" in the last 8760 hours. HbA1C: No results for input(s): "HGBA1C" in the last 72 hours. CBG: No results for input(s): "GLUCAP" in the last 168 hours. Lipid Profile: No results for input(s): "CHOL", "HDL", "LDLCALC", "TRIG", "CHOLHDL", "LDLDIRECT" in the last 72 hours. Thyroid  Function Tests: No results for input(s): "TSH", "T4TOTAL", "FREET4", "T3FREE", "THYROIDAB" in the last 72 hours. Anemia Panel: No results for input(s): "VITAMINB12", "FOLATE", "FERRITIN", "TIBC", "IRON ", "RETICCTPCT" in the last 72 hours. Urine analysis:    Component Value Date/Time   COLORURINE AMBER (A) 01/28/2023 2253   APPEARANCEUR HAZY (A) 01/28/2023 2253   APPEARANCEUR Cloudy (A) 07/08/2017 1103   LABSPEC 1.013 01/28/2023 2253   PHURINE 6.0 01/28/2023 2253   GLUCOSEU NEGATIVE 01/28/2023 2253   HGBUR MODERATE (A) 01/28/2023 2253   BILIRUBINUR NEGATIVE 01/28/2023 2253   BILIRUBINUR neg 10/10/2020 1112  BILIRUBINUR Negative 07/08/2017 1103   KETONESUR NEGATIVE 01/28/2023  2253   PROTEINUR NEGATIVE 01/28/2023 2253   UROBILINOGEN 0.2 10/10/2020 1112   NITRITE NEGATIVE 01/28/2023 2253   LEUKOCYTESUR LARGE (A) 01/28/2023 2253   Sepsis Labs: @LABRCNTIP (procalcitonin:4,lacticidven:4) ) Recent Results (from the past 240 hours)  Body fluid culture w Gram Stain     Status: None   Collection Time: 05/22/23  9:34 AM   Specimen: PATH Cytology Peritoneal fluid  Result Value Ref Range Status   Specimen Description   Final    PERITONEAL Performed at [facility], [address], [place], [place] [address]    Special Requests   Final    NONE Performed at [facility], [address] [place], [place] [address]    Gram Stain NO WBC SEEN NO ORGANISMS SEEN   Final   Culture   Final    NO GROWTH 3 DAYS Performed at [facility], [address], [place], [place] [address]    Report Status 05/25/2023 FINAL  Final     Radiological Exams on Admission:   Assessment/Plan Principal Problem:   Ascites Active Problems:   Decompensated nonalcoholic liver cirrhosis (HCC)   Pancytopenia (HCC)   Hypokalemia   Hypothyroidism   Chronic heart failure with preserved ejection fraction (HFpEF) (HCC)   HLD (hyperlipidemia)   Diabetes mellitus without complication (HCC)   OSA on CPAP   Overweight (BMI 25.0-29.9)   Anxiety   Assessment and Plan:  Ascites: Patient has rapidly re-accumulated  ascites, with severe abdominal distention.  Diagnostic paracentesis was done by EDP, fluid analysis is not consistent with SBP.  No signs of infection.  -Place in telemetry bed for observation - Ordered paracentesis to be done by IR -will tive 25 g of albumin  in  morning - May need to set up outpatient regular paracentesis at discharge  Decompensated nonalcoholic liver cirrhosis [facility]): Mental status normal.  INR 1.3, PTT 32. - Check ammonia level - Hold nadolol , spironolactone , torsemide  due to softer blood pressure  Pancytopenia (HCC): Hemoglobin  stable. -Follow-up CBC  Hypokalemia: Potassium 2.9 -Repleted potassium - Check magnesium  and phosphorus level  Hypothyroidism -Synthroid   Hypertension: Blood pressure is soft, 87/69, 95/51, 110/61 - Hold nadolol , spironolactone , torsemide  - Start midodrine  5 mg 3 times daily  Chronic heart failure with preserved ejection fraction (HFpEF) (HCC): 2D echo on 10/07/2022 showed EF of 55 to 60% with grade 2 diastolic dysfunction.  Patient does not have leg edema or JVD.  She has SOB, which is likely due to abdominal distention secondary to worsening ascites.  CHF seem to be compensated. -Hold spironolactone , torsemide  due to softer blood pressure - BNP -> 181.6  HLD (hyperlipidemia) - Fenofibrate  and zocor   Diabetes mellitus without complication (HCC): Recent A1c 5.6, well-controlled.  Patient taking NovoLog , monitor, Tresiba  200 unit daily -Sliding scale insulin  - Glargine insulin   OSA - on CPAP  Overweight (BMI 25.0-29.9): Body weight 28.17, BMI - Encourage losing weight - Exercise and healthy diet  Anxiety - Start Xanax  0.25 mg twice daily as needed      DVT ppx: SCD  Code Status: Full code per pt  Family Communication:     not done, no family member is at bed side.     Disposition Plan:  Anticipate discharge back to previous environment  Consults called:  none  Admission status and Level of care: Telemetry Medical:    for obs     Dispo: The patient is from: Home  Anticipated d/c is to: Home              Anticipated d/c date is: 1 day              Patient currently is not medically stable to d/c.    Severity of Illness:  The appropriate patient status for this patient is OBSERVATION. Observation status is judged to be reasonable and necessary in order to provide the required intensity of service to ensure the patient's safety. The patient's presenting symptoms, physical exam findings, and initial radiographic and laboratory data in the context of  their medical condition is felt to place them at decreased risk for further clinical deterioration. Furthermore, it is anticipated that the patient will be medically stable for discharge from the hospital within 2 midnights of admission.        Date of Service 05/30/2023    Mayco Walrond [facility]   If 7PM-7AM, please contact night-coverage [email or web address] 05/30/2023, 10:08 PM

## 2023-05-30 NOTE — ED Triage Notes (Addendum)
 Pt comes with c/o sob. Pt has paracentesis done last Thursday. Pt states they got off 5L. Pt states swollen abdomen and trouble breathing that started on Wed. Pt states each day it has gotten worse. Pt abdomen is very distended and hard. Pt appears very uncomfortable with some labored breathing.

## 2023-05-31 ENCOUNTER — Observation Stay

## 2023-05-31 DIAGNOSIS — R188 Other ascites: Secondary | ICD-10-CM | POA: Diagnosis not present

## 2023-05-31 DIAGNOSIS — G4733 Obstructive sleep apnea (adult) (pediatric): Secondary | ICD-10-CM

## 2023-05-31 DIAGNOSIS — D61818 Other pancytopenia: Secondary | ICD-10-CM | POA: Diagnosis not present

## 2023-05-31 DIAGNOSIS — E876 Hypokalemia: Secondary | ICD-10-CM | POA: Diagnosis not present

## 2023-05-31 DIAGNOSIS — K7290 Hepatic failure, unspecified without coma: Secondary | ICD-10-CM | POA: Diagnosis not present

## 2023-05-31 LAB — CBC
HCT: 30.7 % — ABNORMAL LOW (ref 36.0–46.0)
Hemoglobin: 10.5 g/dL — ABNORMAL LOW (ref 12.0–15.0)
MCH: 32.6 pg (ref 26.0–34.0)
MCHC: 34.2 g/dL (ref 30.0–36.0)
MCV: 95.3 fL (ref 80.0–100.0)
Platelets: 113 10*3/uL — ABNORMAL LOW (ref 150–400)
RBC: 3.22 MIL/uL — ABNORMAL LOW (ref 3.87–5.11)
RDW: 15.3 % (ref 11.5–15.5)
WBC: 3.8 10*3/uL — ABNORMAL LOW (ref 4.0–10.5)
nRBC: 0 % (ref 0.0–0.2)

## 2023-05-31 LAB — LACTATE DEHYDROGENASE, PLEURAL OR PERITONEAL FLUID: LD, Fluid: 32 U/L — ABNORMAL HIGH (ref 3–23)

## 2023-05-31 LAB — BASIC METABOLIC PANEL WITH GFR
Anion gap: 5 (ref 5–15)
BUN: 16 mg/dL (ref 8–23)
CO2: 26 mmol/L (ref 22–32)
Calcium: 8.2 mg/dL — ABNORMAL LOW (ref 8.9–10.3)
Chloride: 106 mmol/L (ref 98–111)
Creatinine, Ser: 0.82 mg/dL (ref 0.44–1.00)
GFR, Estimated: >60 mL/min (ref >60)
Glucose, Bld: 45 mg/dL — ABNORMAL LOW (ref 70–99)
Potassium: 2.9 mmol/L — ABNORMAL LOW (ref 3.5–5.1)
Sodium: 137 mmol/L (ref 135–145)

## 2023-05-31 LAB — ALBUMIN, PLEURAL OR PERITONEAL FLUID: Albumin, Fluid: <1.5 g/dL

## 2023-05-31 LAB — PROTEIN, PLEURAL OR PERITONEAL FLUID: Total protein, fluid: <3 g/dL

## 2023-05-31 LAB — GLUCOSE, CAPILLARY
Glucose-Capillary: 51 mg/dL — ABNORMAL LOW (ref 70–99)
Glucose-Capillary: 99 mg/dL (ref 70–99)
Glucose-Capillary: 124 mg/dL — ABNORMAL HIGH (ref 70–99)
Glucose-Capillary: 142 mg/dL — ABNORMAL HIGH (ref 70–99)

## 2023-05-31 LAB — BODY FLUID CELL COUNT WITH DIFFERENTIAL
Eos, Fluid: 0 %
Lymphs, Fluid: 85 %
Monocyte-Macrophage-Serous Fluid: 10 %
Neutrophil Count, Fluid: 5 %
Total Nucleated Cell Count, Fluid: 155 uL

## 2023-05-31 MED ORDER — MIDODRINE HCL 5 MG PO TABS: 5.0000 mg | ORAL_TABLET | Freq: Three times a day (TID) | ORAL | 1 refills | Status: DC

## 2023-05-31 MED ORDER — INSULIN GLARGINE-YFGN 100 UNIT/ML ~~LOC~~ SOLN
100.0000 [IU] | Freq: Every day | SUBCUTANEOUS | Status: DC
  Filled 2023-05-31: qty 1

## 2023-05-31 MED ORDER — POTASSIUM CHLORIDE 20 MEQ PO PACK
40.0000 meq | PACK | ORAL | Status: DC
  Administered 2023-05-31: 40 meq via ORAL
  Filled 2023-05-31 (×2): qty 2

## 2023-05-31 MED ORDER — LIDOCAINE HCL (PF) 1 % IJ SOLN
10.0000 mL | Freq: Once | INTRAMUSCULAR | Status: AC
Start: 2023-05-31 — End: 2023-05-31
  Administered 2023-05-31: 10 mL via INTRADERMAL
  Filled 2023-05-31: qty 10

## 2023-05-31 NOTE — Discharge Summary (Signed)
 Physician Discharge Summary   Patient: Annette Foster MRN: [id number] DOB: 08/01/1940  Admit date:     05/30/2023  Discharge date: 05/31/23  Discharge Physician: Luna Salinas   PCP: Westley Hammers, MD   Recommendations at discharge:  Please obtain CBC and CMP on follow-up Gastroenterologist should be able to arrange paracentesis regularly if needed Follow-up with primary care provider Follow-up with gastroenterology  Discharge Diagnoses: Principal Problem:   Ascites Active Problems:   Decompensated nonalcoholic liver cirrhosis (HCC)   Pancytopenia (HCC)   Hypokalemia   Hypothyroidism   Chronic heart failure with preserved ejection fraction (HFpEF) (HCC)   HLD (hyperlipidemia)   Diabetes mellitus without complication (HCC)   OSA on CPAP   Overweight (BMI 25.0-29.9)   Anxiety   Hospital Course: Taken from H&P.  Annette Foster is a 83 y.o. 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   Patient was recently hospitalized from 4/9 - 4/11 due to worsening ascites.  Patient had paracentesis with 4.8 L fluid removed.  She states that she has progressively worsening abdominal distention in the past several days.   Diagnostic paracentesis was done in ED, fluid analysis is not consistent with SBP, with total nucleated cells 164 and neutrophil 3%.   On presentation patient was hemodynamically stable,found to have pancytopenia with hemoglobin 10.0, WBC 2.2, platelet 75 (patient had hemoglobin 8.4, WBC 1.4, platelet 45 on 4 /11/25), INR 1.3, PTT 32, GFR> 60, BNP 181, potassium 2.9.  EKG with paced  rhythm, QTc 492  US  guided therapeutic paracentesis was ordered with albumin .  Potassium was repleted.  4/19: Vital stable, patient had hypoglycemia with blood glucose of 51 earlier, CBC seems stable, persistent hypokalemia with potassium of 2.9, magnesium  was normal. Patient was on very high dose of Tresiba  200 units at bedtime at home-started on Semglee  150 units at night-decrease to 100 which is half of the home dose. Apparently patient called her GI 3 times over the past week due to progressive discomfort due to worsening ascites, she could not get an appointment which resulted in this ED visit. Talked with IR provider and they were very helpful to come over later today to help this lady.  Patient's gastroenterologist should be able to make arrangements for regular paracentesis if needed as outpatient.  We also added midodrine  so diuretic dose can be increased if blood pressure allows.  Patient will continue the rest of her home medications and need to have a close follow-up with her providers for further assistance.   Consultants: None Procedures performed: Paracentesis Disposition: Home Diet recommendation:  Discharge Diet Orders (From admission, onward)     Start     Ordered   05/31/23 0000  Diet - low sodium heart healthy        05/31/23 1413           Cardiac and Carb modified diet DISCHARGE MEDICATION: Allergies as of 05/31/2023       Reactions   Codeine Itching, Nausea And Vomiting, Other (See Comments)   Demeclocycline Rash   Demerol [meperidine] Itching, Nausea And Vomiting   Hydrocodone Itching, Nausea And Vomiting   Morphine  Nausea Only   Other Other (See Comments)  Oxycodone  Itching, Nausea And Vomiting   Pentazocine Itching, Nausea And Vomiting, Other (See Comments)   Tetracyclines & Related Rash   Coal Tar Extract Other (See Comments)   Hydrocodone-acetaminophen  Other (See Comments)   Salicylic Acid Other (See Comments)   Tetracycline Hcl Other (See  Comments)        Medication List     TAKE these medications    albuterol  108 (90 Base) MCG/ACT inhaler Commonly known as: VENTOLIN  HFA Inhale 2 puffs into the lungs every 4 (four) hours as needed for wheezing or shortness of breath.   Azelastine  HCl 137 MCG/SPRAY Soln INHALE 1-2 PUFFS IN EACH NOSTRIL NASALLY ONCE A DAY 30 DAY(S)   clobetasol  ointment 0.05 % Commonly known as: TEMOVATE  Apply 1 application topically as needed (vaginal irritation).   clotrimazole  1 % cream Commonly known as: LOTRIMIN  Apply 1 Application topically 2 (two) times daily as needed.   cyanocobalamin 1000 MCG/ML injection Commonly known as: VITAMIN B12 1,000 mcg every 30 (thirty) days.   dicyclomine  10 MG capsule Commonly known as: BENTYL  Take 10 mg by mouth in the morning, at noon, and at bedtime.   docusate sodium  100 MG capsule Commonly known as: COLACE Take 100 mg by mouth 3 (three) times daily as needed for moderate constipation.   esomeprazole 40 MG capsule Commonly known as: NEXIUM Take 40 mg by mouth 2 (two) times daily before a meal.   fenofibrate  160 MG tablet Take 160 mg by mouth at bedtime.   gabapentin  300 MG capsule Commonly known as: NEURONTIN  Take 300 mg by mouth 2 (two) times daily.   ipratropium 0.03 % nasal spray Commonly known as: ATROVENT Place 1 spray into both nostrils 2 (two) times daily as needed for rhinitis.   levocetirizine 5 MG tablet Commonly known as: XYZAL Take 5 mg by mouth every evening.   levothyroxine  75 MCG tablet Commonly known as: SYNTHROID  Take 75 mcg by mouth daily before breakfast.   magnesium  oxide 400 MG tablet Commonly known as: MAG-OX Take 400 mg by mouth 2 (two) times daily.   midodrine  5 MG tablet Commonly known as: PROAMATINE  Take 1 tablet (5 mg total) by mouth 3 (three) times daily with meals.   Mounjaro  15 MG/0.5ML Pen Generic drug: tirzepatide  Inject 15 mg into the skin once a week.   nadolol  20 MG tablet Commonly known  as: CORGARD  Take 1 tablet (20 mg total) by mouth daily.   NONFORMULARY OR COMPOUNDED ITEM Nifedipine 0.3% plus lidocaine  2% cream apply to rectum BID and after every BM.   potassium chloride  SA 20 MEQ tablet Commonly known as: KLOR-CON  M Take 20 mEq by mouth 2 (two) times daily.   saccharomyces boulardii 250 MG capsule Commonly known as: FLORASTOR Take 250 mg by mouth 2 (two) times daily.   simvastatin  20 MG tablet Commonly known as: ZOCOR  Take 20 mg by mouth at bedtime.   spironolactone  25 MG tablet Commonly known as: ALDACTONE  Take 1 tablet (25 mg total) by mouth daily.   torsemide  20 MG tablet Commonly known as: DEMADEX  Take 40 mg by mouth daily.   Tresiba  FlexTouch 200 UNIT/ML FlexTouch Pen Generic drug: insulin  degludec 200 Units at bedtime.   valACYclovir  500 MG tablet Commonly known as: VALTREX  Take 500 mg by mouth 2 (two) times daily as needed.   Vitamin D  50 MCG (2000 UT) Caps Take 2,000 Units by mouth daily.        Follow-up Information     Westley Hammers, MD. Schedule  an appointment as soon as possible for a visit in 1 week(s).   Specialty: Internal Medicine Contact information: [address]   [place] [address] [phone]                Discharge Exam: Cleavon Curls Weights   05/30/23 1736 05/31/23 0500  Weight: 72.1 kg 71.9 kg   General.  Frail elderly lady, in no acute distress. Pulmonary.  Lungs clear bilaterally, normal respiratory effort. CV.  Regular rate and rhythm, no JVD, rub or murmur. Abdomen.  Soft, nontender, nondistended, BS positive. CNS.  Alert and oriented .  No focal neurologic deficit. Extremities.  No edema, no cyanosis, pulses intact and symmetrical. Psychiatry.  Judgment and insight appears normal.   Condition at discharge: stable  The results of significant diagnostics from this hospitalization (including imaging, microbiology, ancillary and laboratory) are listed below for reference.   Imaging  Studies: DG Chest 2 View Result Date: 05/30/2023 CLINICAL DATA:  sob EXAM: CHEST - 2 VIEW COMPARISON:  04/01/2023. FINDINGS: Cardiac silhouette enlarged. No evidence of pneumothorax or pleural effusion. No evidence of pulmonary edema. Calcified aorta. Thoracic degenerative changes. Right-sided pacer. IMPRESSION: Enlarged cardiac silhouette. No acute cardiopulmonary process. Electronically Signed   By: Sydell Eva M.D.   On: 05/30/2023 20:27   US  Paracentesis Result Date: 05/22/2023 INDICATION: 83 year old female with nonalcoholic liver cirrhosis with new ascites. First encounter with IR for paracentesis today. EXAM: ULTRASOUND GUIDED THERAPEUTIC AND DIAGNOSTIC PARACENTESIS MEDICATIONS: 9 mL 1% lidocaine  COMPLICATIONS: None immediate. PROCEDURE: Informed written consent was obtained from the patient after a discussion of the risks, benefits and alternatives to treatment. A timeout was performed prior to the initiation of the procedure. Initial ultrasound scanning demonstrates a large amount of ascites within the right abdomen. The right lower abdomen was prepped and draped in the usual sterile fashion. 1% lidocaine  was used for local anesthesia. Following this, a 19 gauge, 7-cm, Yueh catheter was introduced. An ultrasound image was saved for documentation purposes. The paracentesis was performed. The catheter was removed and a dressing was applied. The patient tolerated the procedure well without immediate post procedural complication. Patient received post-procedure intravenous albumin ; see nursing notes for details. FINDINGS: A total of approximately4.8 liters of hazy amber fluid was removed. Samples were sent to the laboratory as requested by the clinical team. IMPRESSION: Successful ultrasound-guided paracentesis yielding 4.8 liters of peritoneal fluid. PLAN: If the patient eventually requires >/=2 paracenteses in a 30 day period, candidacy for formal evaluation by the [facility] will be assessed. Performed by Terressa Fess, NP Electronically Signed   By: Myrlene Asper D.O.   On: 05/22/2023 14:16   CT ABDOMEN PELVIS W CONTRAST Result Date: 05/21/2023 CLINICAL DATA:  Abdominal pain.  Ascites.  Decreased hemoglobin. EXAM: CT ABDOMEN AND PELVIS WITH CONTRAST TECHNIQUE: Multidetector CT imaging of the abdomen and pelvis was performed using the standard protocol following bolus administration of intravenous contrast. RADIATION DOSE REDUCTION: This exam was performed according to the departmental dose-optimization program which includes automated exposure control, adjustment of the mA and/or kV according to patient size and/or use of iterative reconstruction technique. CONTRAST:  OMNIPAQUE  IOHEXOL  300 MG/ML  SOLN COMPARISON:  CT dated 09/11/2022. FINDINGS: Lower chest: Bibasilar atelectasis/scarring. Cardiac pacemaker noted. No intra-abdominal free air.  Moderate ascites. Hepatobiliary: Cirrhosis.  No biliary dilatation.  Cholecystectomy. Pancreas: Unremarkable. No pancreatic ductal dilatation or surrounding inflammatory changes. Spleen: Mild splenomegaly measuring 16 cm in length. Adrenals/Urinary Tract: The  adrenal glands are unremarkable. There is no hydronephrosis on either side. There is symmetric enhancement and excretion of contrast by both kidneys. The urinary bladder is grossly unremarkable. Stomach/Bowel: Mild sigmoid diverticulosis. Diffuse thickened appearance of the colon likely related to ascites and hepatic colopathy. Colitis is less likely. Clinical correlation is recommended. There is a small hiatal hernia. There is no bowel obstruction. Appendectomy. Vascular/Lymphatic: Moderate aortoiliac atherosclerotic disease. The IVC is unremarkable. No portal venous gas. [place] off a geode varices. No adenopathy. Reproductive: Hysterectomy. Other: Mild subcutaneous edema. Musculoskeletal: Osteopenia with degenerative changes. No acute osseous  pathology. IMPRESSION: 1. Cirrhosis with moderate ascites and mild splenomegaly. 2. Mild sigmoid diverticulosis. No bowel obstruction. Electronically Signed   By: Angus Bark M.D.   On: 05/21/2023 18:42   CUP PACEART INCLINIC DEVICE CHECK Result Date: 05/20/2023 Normal in-clinic dual chamber pacemaker check. Presenting Rhythm: APVP . Routine testing of thresholds, sensing, and impedance demonstrate stable parameters and no programming changes needed at this time. 20 episodes of AT with longest lasting 1 day and the next longest 10.5 hours. No OAC. Overall burden 5%. Estimated longevity 6.3 years . Pt enrolled in remote follow-up. Changes made this session and as follows. - RA output increased to 1.9V - Program count increased from 13 to 14 - Changes made per CL .Mitchel An, RN   Microbiology: Results for orders placed or performed during the hospital encounter of 05/30/23  Peritoneal fluid culture w Gram Stain     Status: None (Preliminary result)   Collection Time: 05/30/23  6:09 PM   Specimen: Peritoneal Washings; Peritoneal Fluid  Result Value Ref Range Status   Specimen Description   Final    PERITONEAL Performed at [facility], [address] [place], [place] [address]    Special Requests   Final    NONE Performed at [facility], [address] [place], [place] [address]    Gram Stain NO WBC SEEN NO ORGANISMS SEEN   Final   Culture   Final    NO GROWTH < 12 HOURS Performed at [facility], [address] [place], [place] [address]    Report Status PENDING  Incomplete    Labs: CBC: Recent Labs  Lab 05/30/23 1809 05/31/23 0519  WBC 2.2* 3.8*  HGB 10.0* 10.5*  HCT 30.6* 30.7*  MCV 97.5 95.3  PLT 75* 113*   Basic Metabolic Panel: Recent Labs  Lab 05/30/23 1809 05/31/23 0519  NA 134* 137  K 2.9* 2.9*  CL 102 106  CO2 28 26  GLUCOSE 119* 45*  BUN 16 16  CREATININE 0.94 0.82  CALCIUM 8.1* 8.2*  MG 2.2  --   PHOS 2.1*  --     Liver Function Tests: Recent Labs  Lab 05/30/23 1809  AST 41  ALT 26  ALKPHOS 53  BILITOT 1.1  PROT 6.6  ALBUMIN  3.1*   CBG: Recent Labs  Lab 05/30/23 2227 05/31/23 0738 05/31/23 0831 05/31/23 0958 05/31/23 1213  GLUCAP 174* 51* 99 124* 142*    Discharge time spent: greater than 30 minutes.  This record has been created using Conservation officer, historic buildings. Errors have been sought and corrected,but may not always be located. Such creation errors do not reflect on the standard of care.   Signed: Luna Salinas, MD [facility] 05/31/2023

## 2023-05-31 NOTE — Plan of Care (Signed)
  Problem: Coping: Goal: Ability to adjust to condition or change in health will improve Outcome: Progressing   Problem: Metabolic: Goal: Ability to maintain appropriate glucose levels will improve Outcome: Progressing   Problem: Nutritional: Goal: Maintenance of adequate nutrition will improve Outcome: Progressing   Problem: Clinical Measurements: Goal: Diagnostic test results will improve Outcome: Progressing   Problem: Activity: Goal: Risk for activity intolerance will decrease Outcome: Progressing   Problem: Coping: Goal: Level of anxiety will decrease Outcome: Progressing   Problem: Pain Managment: Goal: General experience of comfort will improve and/or be controlled Outcome: Progressing   Problem: Safety: Goal: Ability to remain free from injury will improve Outcome: Progressing   Problem: Skin Integrity: Goal: Risk for impaired skin integrity will decrease Outcome: Progressing

## 2023-05-31 NOTE — Plan of Care (Signed)

## 2023-05-31 NOTE — Hospital Course (Addendum)
 Taken from H&P.  Annette Foster is a 83 y.o. 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   Patient was recently hospitalized from 4/9 - 4/11 due to worsening ascites.  Patient had paracentesis with 4.8 L fluid removed.  She states that she has progressively worsening abdominal distention in the past several days.   Diagnostic paracentesis was done in ED, fluid analysis is not consistent with SBP, with total nucleated cells 164 and neutrophil 3%.   On presentation patient was hemodynamically stable,found to have pancytopenia with hemoglobin 10.0, WBC 2.2, platelet 75 (patient had hemoglobin 8.4, WBC 1.4, platelet 45 on 4 /11/25), INR 1.3, PTT 32, GFR> 60, BNP 181, potassium 2.9.  EKG with paced rhythm, QTc 492  US  guided therapeutic paracentesis was ordered with albumin .  Potassium was repleted.  4/19: Vital stable, patient had hypoglycemia with blood glucose of 51 earlier, CBC seems stable, persistent hypokalemia with potassium of 2.9, magnesium  was normal. Patient was on very high dose of Tresiba  200 units at bedtime at home-started on Semglee  150 units at night-decrease to 100 which is half of the home dose. Apparently patient called her GI 3 times over the past week due to progressive discomfort due to worsening ascites, she could not get an appointment which resulted in this ED visit. Talked with IR provider and they were very helpful to come over later today to help this lady.  Patient's gastroenterologist should be able to make arrangements for regular paracentesis if needed as outpatient.  We also  added midodrine  so diuretic dose can be increased if blood pressure allows.  Patient will continue the rest of her home medications and need to have a close follow-up with her providers for further assistance.

## 2023-06-03 LAB — BODY FLUID CULTURE W GRAM STAIN
Culture: NO GROWTH
Gram Stain: NONE SEEN

## 2023-06-04 ENCOUNTER — Inpatient Hospital Stay (HOSPITAL_BASED_OUTPATIENT_CLINIC_OR_DEPARTMENT_OTHER): Payer: Medicare PPO | Admitting: Internal Medicine

## 2023-06-04 ENCOUNTER — Inpatient Hospital Stay
Admission: EM | Admit: 2023-06-04 | Discharge: 2023-06-18 | DRG: 940 | Disposition: A | Discharge status: Home Health | Attending: Internal Medicine | Admitting: Internal Medicine

## 2023-06-04 ENCOUNTER — Other Ambulatory Visit: Payer: Self-pay

## 2023-06-04 ENCOUNTER — Inpatient Hospital Stay: Payer: Medicare PPO

## 2023-06-04 DIAGNOSIS — E611 Iron deficiency: Secondary | ICD-10-CM

## 2023-06-04 DIAGNOSIS — K3189 Other diseases of stomach and duodenum: Secondary | ICD-10-CM | POA: Diagnosis present

## 2023-06-04 DIAGNOSIS — E118 Type 2 diabetes mellitus with unspecified complications: Secondary | ICD-10-CM | POA: Diagnosis present

## 2023-06-04 DIAGNOSIS — K7682 Hepatic encephalopathy: Secondary | ICD-10-CM | POA: Diagnosis present

## 2023-06-04 DIAGNOSIS — Z79899 Other long term (current) drug therapy: Secondary | ICD-10-CM

## 2023-06-04 DIAGNOSIS — Z9841 Cataract extraction status, right eye: Secondary | ICD-10-CM

## 2023-06-04 DIAGNOSIS — T500X5A Adverse effect of mineralocorticoids and their antagonists, initial encounter: Secondary | ICD-10-CM | POA: Diagnosis present

## 2023-06-04 DIAGNOSIS — Z91048 Other nonmedicinal substance allergy status: Secondary | ICD-10-CM

## 2023-06-04 DIAGNOSIS — Z9012 Acquired absence of left breast and nipple: Secondary | ICD-10-CM

## 2023-06-04 DIAGNOSIS — G4733 Obstructive sleep apnea (adult) (pediatric): Secondary | ICD-10-CM | POA: Diagnosis present

## 2023-06-04 DIAGNOSIS — Z9049 Acquired absence of other specified parts of digestive tract: Secondary | ICD-10-CM

## 2023-06-04 DIAGNOSIS — R188 Other ascites: Secondary | ICD-10-CM | POA: Diagnosis not present

## 2023-06-04 DIAGNOSIS — Z794 Long term (current) use of insulin: Secondary | ICD-10-CM

## 2023-06-04 DIAGNOSIS — R109 Unspecified abdominal pain: Secondary | ICD-10-CM | POA: Diagnosis present

## 2023-06-04 DIAGNOSIS — Z9889 Other specified postprocedural states: Secondary | ICD-10-CM

## 2023-06-04 DIAGNOSIS — I110 Hypertensive heart disease with heart failure: Secondary | ICD-10-CM | POA: Diagnosis present

## 2023-06-04 DIAGNOSIS — Z806 Family history of leukemia: Secondary | ICD-10-CM

## 2023-06-04 DIAGNOSIS — Z90710 Acquired absence of both cervix and uterus: Secondary | ICD-10-CM

## 2023-06-04 DIAGNOSIS — F419 Anxiety disorder, unspecified: Secondary | ICD-10-CM | POA: Diagnosis present

## 2023-06-04 DIAGNOSIS — K7581 Nonalcoholic steatohepatitis (NASH): Secondary | ICD-10-CM | POA: Diagnosis present

## 2023-06-04 DIAGNOSIS — I2510 Atherosclerotic heart disease of native coronary artery without angina pectoris: Secondary | ICD-10-CM | POA: Diagnosis present

## 2023-06-04 DIAGNOSIS — Z950 Presence of cardiac pacemaker: Secondary | ICD-10-CM

## 2023-06-04 DIAGNOSIS — Z90722 Acquired absence of ovaries, bilateral: Secondary | ICD-10-CM

## 2023-06-04 DIAGNOSIS — K5900 Constipation, unspecified: Secondary | ICD-10-CM | POA: Diagnosis not present

## 2023-06-04 DIAGNOSIS — Z923 Personal history of irradiation: Secondary | ICD-10-CM

## 2023-06-04 DIAGNOSIS — Z888 Allergy status to other drugs, medicaments and biological substances status: Secondary | ICD-10-CM

## 2023-06-04 DIAGNOSIS — E039 Hypothyroidism, unspecified: Secondary | ICD-10-CM | POA: Diagnosis present

## 2023-06-04 DIAGNOSIS — E876 Hypokalemia: Secondary | ICD-10-CM | POA: Diagnosis present

## 2023-06-04 DIAGNOSIS — Z95828 Presence of other vascular implants and grafts: Secondary | ICD-10-CM

## 2023-06-04 DIAGNOSIS — Z886 Allergy status to analgesic agent status: Secondary | ICD-10-CM

## 2023-06-04 DIAGNOSIS — Z8052 Family history of malignant neoplasm of bladder: Secondary | ICD-10-CM

## 2023-06-04 DIAGNOSIS — Z96691 Finger-joint replacement of right hand: Secondary | ICD-10-CM | POA: Diagnosis present

## 2023-06-04 DIAGNOSIS — N179 Acute kidney failure, unspecified: Secondary | ICD-10-CM | POA: Diagnosis present

## 2023-06-04 DIAGNOSIS — Z800 Family history of malignant neoplasm of digestive organs: Secondary | ICD-10-CM

## 2023-06-04 DIAGNOSIS — Z885 Allergy status to narcotic agent status: Secondary | ICD-10-CM

## 2023-06-04 DIAGNOSIS — D689 Coagulation defect, unspecified: Secondary | ICD-10-CM | POA: Diagnosis present

## 2023-06-04 DIAGNOSIS — K589 Irritable bowel syndrome without diarrhea: Secondary | ICD-10-CM | POA: Diagnosis present

## 2023-06-04 DIAGNOSIS — I493 Ventricular premature depolarization: Secondary | ICD-10-CM | POA: Diagnosis present

## 2023-06-04 DIAGNOSIS — D61818 Other pancytopenia: Secondary | ICD-10-CM | POA: Diagnosis not present

## 2023-06-04 DIAGNOSIS — Z5986 Financial insecurity: Secondary | ICD-10-CM

## 2023-06-04 DIAGNOSIS — I959 Hypotension, unspecified: Secondary | ICD-10-CM | POA: Insufficient documentation

## 2023-06-04 DIAGNOSIS — K7460 Unspecified cirrhosis of liver: Secondary | ICD-10-CM

## 2023-06-04 DIAGNOSIS — Z8042 Family history of malignant neoplasm of prostate: Secondary | ICD-10-CM

## 2023-06-04 DIAGNOSIS — Z7985 Long-term (current) use of injectable non-insulin antidiabetic drugs: Secondary | ICD-10-CM

## 2023-06-04 DIAGNOSIS — Z881 Allergy status to other antibiotic agents status: Secondary | ICD-10-CM

## 2023-06-04 DIAGNOSIS — D509 Iron deficiency anemia, unspecified: Secondary | ICD-10-CM | POA: Diagnosis present

## 2023-06-04 DIAGNOSIS — Z79890 Hormone replacement therapy (postmenopausal): Secondary | ICD-10-CM

## 2023-06-04 DIAGNOSIS — Z86000 Personal history of in-situ neoplasm of breast: Secondary | ICD-10-CM

## 2023-06-04 DIAGNOSIS — Z96652 Presence of left artificial knee joint: Secondary | ICD-10-CM | POA: Diagnosis present

## 2023-06-04 DIAGNOSIS — Z833 Family history of diabetes mellitus: Secondary | ICD-10-CM

## 2023-06-04 DIAGNOSIS — D500 Iron deficiency anemia secondary to blood loss (chronic): Secondary | ICD-10-CM | POA: Diagnosis not present

## 2023-06-04 DIAGNOSIS — K766 Portal hypertension: Secondary | ICD-10-CM | POA: Diagnosis present

## 2023-06-04 DIAGNOSIS — Z8249 Family history of ischemic heart disease and other diseases of the circulatory system: Secondary | ICD-10-CM

## 2023-06-04 DIAGNOSIS — Z803 Family history of malignant neoplasm of breast: Secondary | ICD-10-CM

## 2023-06-04 DIAGNOSIS — Z961 Presence of intraocular lens: Secondary | ICD-10-CM | POA: Diagnosis present

## 2023-06-04 DIAGNOSIS — I5032 Chronic diastolic (congestive) heart failure: Secondary | ICD-10-CM | POA: Diagnosis present

## 2023-06-04 DIAGNOSIS — E785 Hyperlipidemia, unspecified: Secondary | ICD-10-CM | POA: Diagnosis present

## 2023-06-04 DIAGNOSIS — Z9842 Cataract extraction status, left eye: Secondary | ICD-10-CM

## 2023-06-04 DIAGNOSIS — K219 Gastro-esophageal reflux disease without esophagitis: Secondary | ICD-10-CM | POA: Diagnosis present

## 2023-06-04 DIAGNOSIS — Z853 Personal history of malignant neoplasm of breast: Secondary | ICD-10-CM

## 2023-06-04 LAB — CBC WITH DIFFERENTIAL (CANCER CENTER ONLY)
Abs Immature Granulocytes: 0.01 10*3/uL (ref 0.00–0.07)
Basophils Absolute: 0 10*3/uL (ref 0.0–0.1)
Basophils Relative: 1 %
Eosinophils Absolute: 0.1 10*3/uL (ref 0.0–0.5)
Eosinophils Relative: 3 %
HCT: 30.1 % — ABNORMAL LOW (ref 36.0–46.0)
Hemoglobin: 9.7 g/dL — ABNORMAL LOW (ref 12.0–15.0)
Immature Granulocytes: 1 %
Lymphocytes Relative: 27 %
Lymphs Abs: 0.6 10*3/uL — ABNORMAL LOW (ref 0.7–4.0)
MCH: 31.3 pg (ref 26.0–34.0)
MCHC: 32.2 g/dL (ref 30.0–36.0)
MCV: 97.1 fL (ref 80.0–100.0)
Monocytes Absolute: 0.2 10*3/uL (ref 0.1–1.0)
Monocytes Relative: 11 %
Neutro Abs: 1.2 10*3/uL — ABNORMAL LOW (ref 1.7–7.7)
Neutrophils Relative %: 57 %
Platelet Count: 73 10*3/uL — ABNORMAL LOW (ref 150–400)
RBC: 3.1 MIL/uL — ABNORMAL LOW (ref 3.87–5.11)
RDW: 15.1 % (ref 11.5–15.5)
WBC Count: 2.1 10*3/uL — ABNORMAL LOW (ref 4.0–10.5)
nRBC: 0 % (ref 0.0–0.2)

## 2023-06-04 LAB — LIPASE, BLOOD: Lipase: 42 U/L (ref 11–51)

## 2023-06-04 LAB — COMPREHENSIVE METABOLIC PANEL WITH GFR
ALT: 21 U/L (ref 0–44)
AST: 37 U/L (ref 15–41)
Albumin: 3.3 g/dL — ABNORMAL LOW (ref 3.5–5.0)
Alkaline Phosphatase: 52 U/L (ref 38–126)
Anion gap: 11 (ref 5–15)
BUN: 18 mg/dL (ref 8–23)
CO2: 26 mmol/L (ref 22–32)
Calcium: 8.5 mg/dL — ABNORMAL LOW (ref 8.9–10.3)
Chloride: 102 mmol/L (ref 98–111)
Creatinine, Ser: 1.05 mg/dL — ABNORMAL HIGH (ref 0.44–1.00)
GFR, Estimated: 53 mL/min — ABNORMAL LOW (ref >60)
Glucose, Bld: 132 mg/dL — ABNORMAL HIGH (ref 70–99)
Potassium: 3.5 mmol/L (ref 3.5–5.1)
Sodium: 139 mmol/L (ref 135–145)
Total Bilirubin: 1.1 mg/dL (ref 0.0–1.2)
Total Protein: 7.1 g/dL (ref 6.5–8.1)

## 2023-06-04 LAB — IRON AND TIBC
Iron: 71 ug/dL (ref 28–170)
Saturation Ratios: 23 % (ref 10.4–31.8)
TIBC: 307 ug/dL (ref 250–450)
UIBC: 236 ug/dL

## 2023-06-04 LAB — SAMPLE TO BLOOD BANK

## 2023-06-04 LAB — CBC
HCT: 31.9 % — ABNORMAL LOW (ref 36.0–46.0)
Hemoglobin: 10.3 g/dL — ABNORMAL LOW (ref 12.0–15.0)
MCH: 32.1 pg (ref 26.0–34.0)
MCHC: 32.3 g/dL (ref 30.0–36.0)
MCV: 99.4 fL (ref 80.0–100.0)
Platelets: 81 10*3/uL — ABNORMAL LOW (ref 150–400)
RBC: 3.21 MIL/uL — ABNORMAL LOW (ref 3.87–5.11)
RDW: 15.1 % (ref 11.5–15.5)
WBC: 2.3 10*3/uL — ABNORMAL LOW (ref 4.0–10.5)
nRBC: 0 % (ref 0.0–0.2)

## 2023-06-04 LAB — CYTOLOGY - NON PAP

## 2023-06-04 LAB — CBG MONITORING, ED: Glucose-Capillary: 107 mg/dL — ABNORMAL HIGH (ref 70–99)

## 2023-06-04 LAB — CMP (CANCER CENTER ONLY)
ALT: 22 U/L (ref 0–44)
AST: 34 U/L (ref 15–41)
Albumin: 3.2 g/dL — ABNORMAL LOW (ref 3.5–5.0)
Alkaline Phosphatase: 52 U/L (ref 38–126)
Anion gap: 8 (ref 5–15)
BUN: 17 mg/dL (ref 8–23)
CO2: 26 mmol/L (ref 22–32)
Calcium: 8.1 mg/dL — ABNORMAL LOW (ref 8.9–10.3)
Chloride: 102 mmol/L (ref 98–111)
Creatinine: 1.09 mg/dL — ABNORMAL HIGH (ref 0.44–1.00)
GFR, Estimated: 51 mL/min — ABNORMAL LOW (ref >60)
Glucose, Bld: 179 mg/dL — ABNORMAL HIGH (ref 70–99)
Potassium: 3.2 mmol/L — ABNORMAL LOW (ref 3.5–5.1)
Sodium: 136 mmol/L (ref 135–145)
Total Bilirubin: 1.1 mg/dL (ref 0.0–1.2)
Total Protein: 6.6 g/dL (ref 6.5–8.1)

## 2023-06-04 LAB — BODY FLUID CULTURE W GRAM STAIN: Gram Stain: NONE SEEN

## 2023-06-04 LAB — FERRITIN: Ferritin: 268 ng/mL (ref 11–307)

## 2023-06-04 MED ORDER — NADOLOL 20 MG PO TABS
20.0000 mg | ORAL_TABLET | Freq: Every day | ORAL | Status: DC
  Administered 2023-06-05 – 2023-06-17 (×12): 20 mg via ORAL
  Filled 2023-06-04 (×14): qty 1

## 2023-06-04 MED ORDER — INSULIN ASPART 100 UNIT/ML IJ SOLN
0.0000 [IU] | INTRAMUSCULAR | Status: DC
  Administered 2023-06-05: 2 [IU] via SUBCUTANEOUS
  Filled 2023-06-04: qty 1

## 2023-06-04 MED ORDER — SPIRONOLACTONE 25 MG PO TABS: 25.0000 mg | ORAL_TABLET | Freq: Every day | ORAL | Status: DC

## 2023-06-04 MED ORDER — ALBUMIN HUMAN 25 % IV SOLN
50.0000 g | Freq: Once | INTRAVENOUS | Status: AC | PRN
  Administered 2023-06-05: 50 g via INTRAVENOUS
  Filled 2023-06-04 (×2): qty 200

## 2023-06-04 MED ORDER — TRAMADOL HCL 50 MG PO TABS
50.0000 mg | ORAL_TABLET | Freq: Three times a day (TID) | ORAL | Status: DC | PRN
  Administered 2023-06-05 – 2023-06-15 (×16): 50 mg via ORAL
  Filled 2023-06-04 (×20): qty 1

## 2023-06-04 MED ORDER — ACETAMINOPHEN 650 MG RE SUPP: 650.0000 mg | Freq: Four times a day (QID) | RECTAL | Status: DC | PRN

## 2023-06-04 MED ORDER — ALBUTEROL SULFATE (2.5 MG/3ML) 0.083% IN NEBU: 2.5000 mg | INHALATION_SOLUTION | RESPIRATORY_TRACT | Status: DC | PRN

## 2023-06-04 MED ORDER — ONDANSETRON HCL 8 MG PO TABS: ORAL_TABLET | ORAL | 1 refills | Status: DC

## 2023-06-04 MED ORDER — TRAMADOL HCL 50 MG PO TABS: 50.0000 mg | ORAL_TABLET | Freq: Three times a day (TID) | ORAL | 0 refills | Status: DC | PRN

## 2023-06-04 MED ORDER — ONDANSETRON HCL 4 MG/2ML IJ SOLN
4.0000 mg | Freq: Four times a day (QID) | INTRAMUSCULAR | Status: DC | PRN
  Administered 2023-06-05 – 2023-06-15 (×7): 4 mg via INTRAVENOUS
  Filled 2023-06-04 (×7): qty 2

## 2023-06-04 MED ORDER — ACETAMINOPHEN 325 MG PO TABS
650.0000 mg | ORAL_TABLET | Freq: Four times a day (QID) | ORAL | Status: DC | PRN
  Administered 2023-06-06 – 2023-06-14 (×3): 650 mg via ORAL
  Filled 2023-06-04 (×3): qty 2

## 2023-06-04 MED ORDER — SIMVASTATIN 20 MG PO TABS
20.0000 mg | ORAL_TABLET | Freq: Every day | ORAL | Status: DC
  Administered 2023-06-04 – 2023-06-17 (×13): 20 mg via ORAL
  Filled 2023-06-04 (×2): qty 1
  Filled 2023-06-04: qty 2
  Filled 2023-06-04 (×11): qty 1

## 2023-06-04 MED ORDER — MIDODRINE HCL 5 MG PO TABS
5.0000 mg | ORAL_TABLET | Freq: Three times a day (TID) | ORAL | Status: DC
  Administered 2023-06-05 – 2023-06-15 (×28): 5 mg via ORAL
  Filled 2023-06-04 (×29): qty 1

## 2023-06-04 MED ORDER — LEVOTHYROXINE SODIUM 50 MCG PO TABS
75.0000 ug | ORAL_TABLET | Freq: Every day | ORAL | Status: DC
  Administered 2023-06-05 – 2023-06-18 (×13): 75 ug via ORAL
  Filled 2023-06-04 (×14): qty 2

## 2023-06-04 MED ORDER — ONDANSETRON HCL 4 MG PO TABS
4.0000 mg | ORAL_TABLET | Freq: Four times a day (QID) | ORAL | Status: DC | PRN
Start: 2023-06-04 — End: 2023-06-18
  Administered 2023-06-07: 4 mg via ORAL
  Filled 2023-06-04 (×3): qty 1

## 2023-06-04 MED ORDER — INSULIN GLARGINE-YFGN 100 UNIT/ML ~~LOC~~ SOLN
20.0000 [IU] | Freq: Every day | SUBCUTANEOUS | Status: DC
  Administered 2023-06-05: 20 [IU] via SUBCUTANEOUS
  Filled 2023-06-04: qty 0.2

## 2023-06-04 NOTE — H&P (Signed)
 History and Physical    Patient: Annette Foster PPI:[id number] DOB: 20-Feb-1940 DOA: 06/04/2023 DOS: the patient was seen and examined on 06/04/2023 PCP: Westley Hammers, MD  Patient coming from: Home  Chief Complaint:  Chief Complaint  Patient presents with   Bloated    HPI: Annette Foster is a 83 y.o. 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  She last underwent paracentesis twice in the past 2 weeks on 4/10 and 4/20 with removal of 4.8 And 4.2 L respectively but has had rapid reaccumulation of fluid in spite of compliance with medication. ED course and data review: Soft BP at 114/55 with otherwise normal vitals Labs with baseline pancytopenia, WBC 2.3, hemoglobin 10.3 and platelets 81.  Creatinine 1.05 from baseline of of 0.82 Lipase and LFTs WNL EKG, personally viewed and interpreted showing dual paced rhythm with occasional PVCs at rate of 74 No imaging done Hospitalist consulted for admission.     Past Medical History:  Diagnosis Date   Abdominal pain    Allergy    Cancer (HCC) 2017   breast cancer- Left   Cataract    CHF (congestive heart failure) (HCC)    Collagenous colitis    Coronary artery disease    Diabetes mellitus without complication (HCC)    Diarrhea    Diverticulosis    Dysrhythmia    Fatty liver disease, nonalcoholic    Fibrocystic breast    GERD (gastroesophageal reflux disease)    Heart murmur    Hyperlipidemia    Hypertension    Hypothyroidism    IBS (irritable bowel syndrome)    IDA (iron  deficiency anemia)    Liver cirrhosis (HCC)    Personal history of radiation  therapy 2017   LEFT BREAST CA   PONV (postoperative nausea and vomiting)    Presence of permanent cardiac pacemaker    Sleep apnea    C-Pap   Past Surgical History:  Procedure Laterality Date   ABDOMINAL HYSTERECTOMY     [place] bso   ABDOMINAL SURGERY     APPENDECTOMY     AV NODE ABLATION N/A 05/17/2021   Procedure: AV NODE ABLATION;  Surgeon: Boyce Byes, MD;  [facility]: [facility];  Service: Cardiovascular;  Laterality: N/A;   BREAST BIOPSY Bilateral 2016   negative   BREAST 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/2015   DCIS, papillary carcinoma in situ   BREAST BIOPSY Left 05/27/2016   BENIGN MAMMARY EPITHELIUM   BREAST BIOPSY Left 11/20/2017   affirm bx x clip BENIGN MAMMARY EPITHELIUM CONSISTENT WITH RAD THERAPY   BREAST EXCISIONAL BIOPSY Right    NEG 1980's   BREAST LUMPECTOMY Left 10/17/2015   DCIS and papillary carcinoma insitu, clear margins   CARDIAC SURGERY     has replacement valve   CATARACT EXTRACTION Right    CATARACT EXTRACTION W/PHACO Left 01/16/2021   Procedure: CATARACT EXTRACTION PHACO AND INTRAOCULAR LENS PLACEMENT (IOC) LEFT DIABETIC 8.46 00:59.8;  Surgeon: Clair Crews, MD;  Location: [facility];  Service: Ophthalmology;  Laterality: Left;   CHOLECYSTECTOMY     COLONOSCOPY WITH PROPOFOL  N/A 03/11/2016   Procedure: COLONOSCOPY WITH PROPOFOL ;  Surgeon: Cassie Click, MD;  Location: [facility] ENDOSCOPY;  Service: Endoscopy;  Laterality: N/A;   COLONOSCOPY WITH PROPOFOL  N/A 02/18/2020   Procedure: COLONOSCOPY WITH PROPOFOL ;  Surgeon: Marnee Sink, MD;  Location: [facility] ENDOSCOPY;  Service: Endoscopy;  Laterality: N/A;   COLONOSCOPY WITH PROPOFOL  N/A 09/10/2022   Procedure: COLONOSCOPY WITH PROPOFOL ;  Surgeon: Luke Salaam, MD;  Location: [facility] ENDOSCOPY;  Service: Gastroenterology;  Laterality: N/A;   ESOPHAGEAL BANDING  09/10/2022   Procedure: ESOPHAGEAL BANDING;  Surgeon: Luke Salaam, MD;  Location: [facility] ENDOSCOPY;  Service: Gastroenterology;;   ESOPHAGEAL  BANDING  10/17/2022   Procedure: ESOPHAGEAL BANDING;  Surgeon: Marnee Sink, MD;  Location: [facility] ENDOSCOPY;  Service: Endoscopy;;   ESOPHAGEAL BANDING  12/24/2022   Procedure: ESOPHAGEAL BANDING;  Surgeon: Marnee Sink, MD;  Location: [facility] ENDOSCOPY;  Service: Endoscopy;;   ESOPHAGEAL BANDING  05/13/2023   Procedure: ESOPHAGOSCOPY, WITH ESOPHAGEAL VARICES BAND LIGATION;  Surgeon: Marnee Sink, MD;  Location: [facility] ENDOSCOPY;  Service: Endoscopy;;   ESOPHAGOGASTRODUODENOSCOPY N/A 05/13/2023   Procedure: EGD (ESOPHAGOGASTRODUODENOSCOPY);  Surgeon: Marnee Sink, MD;  Location: [facility] ENDOSCOPY;  Service: Endoscopy;  Laterality: N/A;   ESOPHAGOGASTRODUODENOSCOPY (EGD) WITH PROPOFOL  N/A 03/11/2016   Procedure: ESOPHAGOGASTRODUODENOSCOPY (EGD) WITH PROPOFOL ;  Surgeon: Cassie Click, MD;  Location: [facility] ENDOSCOPY;  Service: Endoscopy;  Laterality: N/A;   ESOPHAGOGASTRODUODENOSCOPY (EGD) WITH PROPOFOL  N/A 02/18/2020   Procedure: ESOPHAGOGASTRODUODENOSCOPY (EGD) WITH PROPOFOL ;  Surgeon: Marnee Sink, MD;  Location: [facility] ENDOSCOPY;  Service: Endoscopy;  Laterality: N/A;   ESOPHAGOGASTRODUODENOSCOPY (EGD) WITH PROPOFOL  N/A 04/25/2020   Procedure: ESOPHAGOGASTRODUODENOSCOPY (EGD) WITH PROPOFOL ;  Surgeon: Marnee Sink, MD;  Location: [facility] ENDOSCOPY;  Service: Endoscopy;  Laterality: N/A;   ESOPHAGOGASTRODUODENOSCOPY (EGD) WITH PROPOFOL  N/A 05/23/2020   Procedure: ESOPHAGOGASTRODUODENOSCOPY (EGD) WITH PROPOFOL ;  Surgeon: Marnee Sink, MD;  Location: [facility] ENDOSCOPY;  Service: Endoscopy;  Laterality: N/A;   ESOPHAGOGASTRODUODENOSCOPY (EGD) WITH PROPOFOL  N/A 09/10/2022   Procedure: ESOPHAGOGASTRODUODENOSCOPY (EGD) WITH PROPOFOL ;  Surgeon: Luke Salaam, MD;  Location: [facility] ENDOSCOPY;  Service: Gastroenterology;  Laterality: N/A;   ESOPHAGOGASTRODUODENOSCOPY (EGD) WITH PROPOFOL  N/A 10/17/2022   Procedure: ESOPHAGOGASTRODUODENOSCOPY (EGD) WITH PROPOFOL ;  Surgeon: Marnee Sink, MD;  Location: [facility] ENDOSCOPY;  Service:  Endoscopy;  Laterality: N/A;   ESOPHAGOGASTRODUODENOSCOPY (EGD) WITH PROPOFOL  N/A 12/24/2022   Procedure: ESOPHAGOGASTRODUODENOSCOPY (EGD) WITH PROPOFOL ;  Surgeon: Marnee Sink, MD;  Location: [facility] ENDOSCOPY;  Service: Endoscopy;  Laterality: N/A;   ESOPHAGOGASTRODUODENOSCOPY (EGD) WITH PROPOFOL  N/A 01/29/2023   Procedure: ESOPHAGOGASTRODUODENOSCOPY (EGD) WITH PROPOFOL ;  Surgeon: Shane Darling, MD;  Location: [facility] ENDOSCOPY;  Service: Endoscopy;  Laterality: N/A;  Ideally we would intubate if possible given concern for varices   EUS N/A 10/04/2021   Procedure: LOWER ENDOSCOPIC ULTRASOUND (EUS);  Surgeon: Rayford Cake, MD;  Location: [facility] ENDOSCOPY;  Service: Gastroenterology;  Laterality: N/A;  [facility]   EYE SURGERY     FINGER ARTHROSCOPY WITH CARPOMETACARPEL ([facility]) ARTHROPLASTY Right 09/03/2018   Procedure: CARPOMETACARPEL [facility]) ARTHROPLASTY RIGHT THUMB;  Surgeon: Molli Angelucci, MD;  Location: [facility] ORS;  Service: Orthopedics;  Laterality: Right;   FLEXIBLE SIGMOIDOSCOPY N/A 09/14/2021   Procedure: FLEXIBLE SIGMOIDOSCOPY;  Surgeon: Marnee Sink, MD;  Location: [facility] ENDOSCOPY;  Service: Endoscopy;  Laterality: N/A;  No anesthesia   GANGLION CYST EXCISION Right 09/03/2018   Procedure: REMOVAL GANGLION OF WRIST;  Surgeon: Molli Angelucci, MD;  Location: [facility] ORS;  Service: Orthopedics;  Laterality: Right;   HARDWARE REMOVAL Right 09/03/2018   Procedure: HARDWARE REMOVAL RIGHT THUMB;  Surgeon: Molli Angelucci, MD;  Location: [facility] ORS;  Service: Orthopedics;  Laterality: Right;  staple removed   HEMOSTASIS CLIP PLACEMENT  09/10/2022  Procedure: HEMOSTASIS CLIP PLACEMENT;  Surgeon: Luke Salaam, MD;  Location: [facility] ENDOSCOPY;  Service: Gastroenterology;;   HEMOSTASIS CONTROL  09/10/2022   Procedure: HEMOSTASIS CONTROL;  Surgeon: Luke Salaam, MD;  Location: [facility] ENDOSCOPY;  Service: Gastroenterology;;   HEMOSTASIS CONTROL  01/29/2023   Procedure: HEMOSTASIS CONTROL;  Surgeon: Shane Darling,  MD;  Location: [facility] ENDOSCOPY;  Service: Endoscopy;;   JOINT REPLACEMENT Left    TKR   left sinusplasty      MASTECTOMY, PARTIAL Left 10/17/2015   Procedure: MASTECTOMY PARTIAL REVISION;  Surgeon: Benancio Bracket, MD;  Location: [facility] ORS;  Service: General;  Laterality: Left;   PACEMAKER IMPLANT N/A 03/23/2021   Procedure: PACEMAKER IMPLANT;  Surgeon: Boyce Byes, MD;  Location: [facility];  Service: Cardiovascular;  Laterality: N/A;   PARTIAL MASTECTOMY WITH NEEDLE LOCALIZATION Left 09/29/2015   Procedure: PARTIAL MASTECTOMY WITH NEEDLE LOCALIZATION;  Surgeon: Benancio Bracket, MD;  Location: [facility] ORS;  Service: General;  Laterality: Left;   POLYPECTOMY  09/10/2022   Procedure: POLYPECTOMY;  Surgeon: Luke Salaam, MD;  Location: [facility] ENDOSCOPY;  Service: Gastroenterology;;   RECTAL EXAM UNDER ANESTHESIA N/A 10/05/2021   Procedure: RECTAL EXAM UNDER ANESTHESIA, ASPIRATION OF RECTAL CYST;  Surgeon: Eldred Grego, MD;  Location: [facility] ORS;  Service: General;  Laterality: N/A;   TOTAL ABDOMINAL HYSTERECTOMY W/ BILATERAL SALPINGOOPHORECTOMY     Social History:  reports that she has never smoked. She has never used smokeless tobacco. She reports that she does not drink alcohol and does not use drugs.  Allergies  Allergen Reactions   Codeine Itching, Nausea And Vomiting and Other (See Comments)   Demeclocycline Rash   Demerol [Meperidine] Itching and Nausea And Vomiting   Hydrocodone Itching and Nausea And Vomiting   Morphine  Nausea Only   Other Other (See Comments)   Oxycodone  Itching and Nausea And Vomiting   Pentazocine Itching, Nausea And Vomiting and Other (See Comments)   Tetracyclines & Related Rash   Coal Tar Extract Other (See Comments)   Hydrocodone-Acetaminophen  Other (See Comments)   Salicylic Acid Other (See Comments)   Tetracycline Hcl Other (See Comments)    Family History  Problem Relation Age of Onset   Breast cancer Paternal Grandmother     Colon cancer Father    Diabetes Sister    Diabetes Brother    Heart disease Brother    Prostate cancer Brother    Colon cancer Maternal Uncle    Prostate cancer Brother    Bladder Cancer Brother    Leukemia Mother        all   Ovarian cancer Neg Hx    Kidney cancer Neg Hx     Prior to Admission medications   Medication Sig Start Date End Date Taking? Authorizing Provider  albuterol  (VENTOLIN  HFA) 108 (90 Base) MCG/ACT inhaler Inhale 2 puffs into the lungs every 4 (four) hours as needed for wheezing or shortness of breath. 04/30/23 04/29/24  [provider]  Azelastine  HCl 137 MCG/SPRAY SOLN INHALE 1-2 PUFFS IN EACH NOSTRIL NASALLY ONCE A DAY 30 DAY(S)    [provider]  Cholecalciferol  (VITAMIN D ) 50 MCG (2000 UT) CAPS Take 2,000 Units by mouth daily.    [provider]  clobetasol  ointment (TEMOVATE ) 0.05 % Apply 1 application topically as needed (vaginal irritation).    [provider]  clotrimazole  (LOTRIMIN ) 1 % cream Apply 1 Application topically 2 (two) times daily as needed.    [provider]  cyanocobalamin (,VITAMIN B-12,)  1000 MCG/ML injection 1,000 mcg every 30 (thirty) days. 04/23/19   [provider]  dicyclomine  (BENTYL ) 10 MG capsule Take 10 mg by mouth in the morning, at noon, and at bedtime. 05/16/19   [provider]  docusate sodium  (COLACE) 100 MG capsule Take 100 mg by mouth 3 (three) times daily as needed for moderate constipation. 01/10/20   [provider]  esomeprazole (NEXIUM) 40 MG capsule Take 40 mg by mouth 2 (two) times daily before a meal.  02/08/16   [provider]  fenofibrate  160 MG tablet Take 160 mg by mouth at bedtime.    [provider]  gabapentin  (NEURONTIN ) 300 MG capsule Take 300 mg by mouth 2 (two) times daily. 09/07/16 10/25/36  Westley Hammers, MD  insulin  aspart (NOVOLOG ) 100 UNIT/ML injection Inject 38 Units into the skin 3 (three) times daily with meals.     [provider]  insulin  degludec (TRESIBA  FLEXTOUCH) 200 UNIT/ML FlexTouch Pen 200 Units at bedtime.    [provider]  ipratropium (ATROVENT) 0.03 % nasal spray Place 1 spray into both nostrils 2 (two) times daily as needed for rhinitis. 04/15/23 04/14/24  [provider]  levocetirizine (XYZAL) 5 MG tablet Take 5 mg by mouth every evening.    [provider]  levothyroxine  (SYNTHROID , LEVOTHROID) 75 MCG tablet Take 75 mcg by mouth daily before breakfast.     [provider]  magnesium  oxide (MAG-OX) 400 MG tablet Take 400 mg by mouth 2 (two) times daily.     [provider]  midodrine  (PROAMATINE ) 5 MG tablet Take 1 tablet (5 mg total) by mouth 3 (three) times daily with meals. 05/31/23   Amin, Sumayya, MD  MOUNJARO  15 MG/0.5ML Pen Inject 15 mg into the skin once a week. 03/18/23   [provider]  nadolol  (CORGARD ) 20 MG tablet Take 1 tablet (20 mg total) by mouth daily. 12/20/22   End, Veryl Gottron, MD  NONFORMULARY OR COMPOUNDED ITEM Nifedipine 0.3% plus lidocaine  2% cream apply to rectum BID and after every BM.    [provider]  ondansetron  (ZOFRAN ) 8 MG tablet One pill every 8 hours as needed for nausea/vomitting. 06/04/23   Brahmanday, Govinda R, MD  potassium chloride  SA (K-DUR,KLOR-CON ) 20 MEQ tablet Take 20 mEq by mouth 2 (two) times daily.     [provider]  saccharomyces boulardii (FLORASTOR) 250 MG capsule Take 250 mg by mouth 2 (two) times daily.     [provider]  simvastatin  (ZOCOR ) 20 MG tablet Take 20 mg by mouth at bedtime.    [provider]  spironolactone  (ALDACTONE ) 25 MG tablet Take 1 tablet (25 mg total) by mouth daily. 05/22/23   Garrison Kanner, MD  torsemide  (DEMADEX ) 20 MG tablet Take 40 mg by mouth daily.    [provider]  traMADol  (ULTRAM ) 50 MG tablet Take 1 tablet (50 mg total) by mouth every 8 (eight) hours as needed. 06/04/23   Brahmanday, Govinda R, MD   valACYclovir  (VALTREX ) 500 MG tablet Take 500 mg by mouth 2 (two) times daily as needed. 02/10/23   [provider]    Physical Exam: Vitals:   06/04/23 1725 06/04/23 1726  BP: (!) 114/55   Pulse: 70   Resp: 18   Temp: 98 F (36.7 C)   TempSrc: Oral   SpO2: 100%   Weight:  68.5 kg  Height:  5\' 3"  (1.6 m)   Physical Exam  Labs on Admission: I have  personally reviewed following labs and imaging studies  CBC: Recent Labs  Lab 05/30/23 1809 05/31/23 0519 06/04/23 1250 06/04/23 1730  WBC 2.2* 3.8* 2.1* 2.3*  NEUTROABS  --   --  1.2*  --   HGB 10.0* 10.5* 9.7* 10.3*  HCT 30.6* 30.7* 30.1* 31.9*  MCV 97.5 95.3 97.1 99.4  PLT 75* 113* 73* 81*   Basic Metabolic Panel: Recent Labs  Lab 05/30/23 1809 05/31/23 0519 06/04/23 1250 06/04/23 1730  NA 134* 137 136 139  K 2.9* 2.9* 3.2* 3.5  CL 102 106 102 102  CO2 28 26 26 26   GLUCOSE 119* 45* 179* 132*  BUN 16 16 17 18   CREATININE 0.94 0.82 1.09* 1.05*  CALCIUM 8.1* 8.2* 8.1* 8.5*  MG 2.2  --   --   --   PHOS 2.1*  --   --   --    GFR: Estimated Creatinine Clearance: 38.3 mL/min (A) (by C-G formula based on SCr of 1.05 mg/dL (H)). Liver Function Tests: Recent Labs  Lab 05/30/23 1809 06/04/23 1250 06/04/23 1730  AST 41 34 37  ALT 26 22 21   ALKPHOS 53 52 52  BILITOT 1.1 1.1 1.1  PROT 6.6 6.6 7.1  ALBUMIN  3.1* 3.2* 3.3*   Recent Labs  Lab 05/30/23 1809 06/04/23 1730  LIPASE 40 42   Recent Labs  Lab 05/30/23 2238  AMMONIA 42*   Coagulation Profile: Recent Labs  Lab 05/30/23 1809  INR 1.3*   Cardiac Enzymes: No results for input(s): "CKTOTAL", "CKMB", "CKMBINDEX", "TROPONINI" in the last 168 hours. BNP (last 3 results) No results for input(s): "PROBNP" in the last 8760 hours. HbA1C: No results for input(s): "HGBA1C" in the last 72 hours. CBG: Recent Labs  Lab 05/30/23 2227 05/31/23 0738 05/31/23 0831 05/31/23 0958 05/31/23 1213  GLUCAP 174* 51* 99 124* 142*   Lipid Profile: No  results for input(s): "CHOL", "HDL", "LDLCALC", "TRIG", "CHOLHDL", "LDLDIRECT" in the last 72 hours. Thyroid  Function Tests: No results for input(s): "TSH", "T4TOTAL", "FREET4", "T3FREE", "THYROIDAB" in the last 72 hours. Anemia Panel: Recent Labs    06/04/23 1250  FERRITIN 268  TIBC 307  IRON  71   Urine analysis:    Component Value Date/Time   COLORURINE AMBER (A) 01/28/2023 2253   APPEARANCEUR HAZY (A) 01/28/2023 2253   APPEARANCEUR Cloudy (A) 07/08/2017 1103   LABSPEC 1.013 01/28/2023 2253   PHURINE 6.0 01/28/2023 2253   GLUCOSEU NEGATIVE 01/28/2023 2253   HGBUR MODERATE (A) 01/28/2023 2253   BILIRUBINUR NEGATIVE 01/28/2023 2253   BILIRUBINUR neg 10/10/2020 1112   BILIRUBINUR Negative 07/08/2017 1103   KETONESUR NEGATIVE 01/28/2023 2253   PROTEINUR NEGATIVE 01/28/2023 2253   UROBILINOGEN 0.2 10/10/2020 1112   NITRITE NEGATIVE 01/28/2023 2253   LEUKOCYTESUR LARGE (A) 01/28/2023 2253    Radiological Exams on Admission: No results found.   Data Reviewed: Relevant notes from primary care and specialist visits, past discharge summaries as available in EHR, including Care Everywhere. Prior diagnostic testing as pertinent to current admission diagnoses Updated medications and problem lists for reconciliation ED course, including vitals, labs, imaging, treatment and response to treatment Triage notes, nursing and pharmacy notes and ED provider's notes Notable results as noted in HPI   Assessment and Plan:   Ascites, rapidly recurring:  - underwent paracentesis twice in the past 2 weeks on 4/10 and 4/20 with removal of 4.8 And 4.2 L respectively -Ordered paracentesis to be done by IR -ordered 50g of albumin  in  morning - May need  to set up weekly outpatient paracentesis versus Pleurx drain   Decompensated nonalcoholic liver cirrhosis (HCC): Mental status normal.   - Hold nadolol , spironolactone , torsemide  due to softer blood pressure   Pancytopenia (HCC):  -stable    AKI - Secondary to spironolactone  and torsemide  -Continue to monitor   Hypothyroidism -Synthroid    Hypertension:  -Blood pressure is soft, - Continue nadolol , spironolactone , torsemide  with hold parameters - Continue midodrine  5 mg 3 times daily   Chronic heart failure with preserved ejection fraction (HFpEF) (HCC):  -Compensated -2D echo on 10/07/2022 showed EF of 55 to 60% with grade 2 diastolic dysfunction.  Patient does not have leg edema or JVD.  She has SOB, which is likely due to abdominal distention secondary to worsening ascites.  CHF seem to be compensated.     HLD (hyperlipidemia) - Fenofibrate  and zocor    Diabetes mellitus without complication [facility]): Recent A1c 5.6, well-controlled.  Patient taking NovoLog , monitor, Tresiba  200 unit daily -Sliding scale insulin  - Glargine insulin    OSA - on CPAP     Anxiety - Start Xanax  0.25 mg twice daily as needed       DVT prophylaxis: SCD  Consults: IR  Advance Care Planning:   Code Status: Prior   Family Communication: none  Disposition Plan: Back to previous home environment  Severity of Illness: The appropriate patient status for this patient is OBSERVATION. Observation status is judged to be reasonable and necessary in order to provide the required intensity of service to ensure the patient's safety. The patient's presenting symptoms, physical exam findings, and initial radiographic and laboratory data in the context of their medical condition is felt to place them at decreased risk for further clinical deterioration. Furthermore, it is anticipated that the patient will be medically stable for discharge from the hospital within 2 midnights of admission.   Author: Lanetta Pion, MD 06/04/2023 9:08 PM  For on call review [email or web address].

## 2023-06-04 NOTE — ED Provider Notes (Signed)
 [facility] Provider Note    Event Date/Time   First MD Initiated Contact with Patient 06/04/23 1934     (approximate)   History   Bloated   HPI Annette Foster is a 83 y.o. female with recurrent ascites presenting today for the same.  Patient has been admitted to the hospital on 4/9 as well as 4/18 for ascites requiring therapeutic paracentesis.  She has had fluid buildup again and saw her oncology team who recommended her come back to the ED to once again have paracentesis performed.  She has still not been able to be seen by GI outpatient in order to get the set up on a weekly basis yet.  Denies any other acute symptoms outside of shortness of breath and worsening abdominal distention.  No significant abdominal pain, nausea, vomiting, diarrhea, constipation.  Reviewed prior admissions as well as oncology note today.     Physical Exam   Triage Vital Signs: ED Triage Vitals  Encounter Vitals Group     BP 06/04/23 1725 (!) 114/55     Systolic BP Percentile --      Diastolic BP Percentile --      Pulse Rate 06/04/23 1725 70     Resp 06/04/23 1725 18     Temp 06/04/23 1725 98 F (36.7 C)     Temp Source 06/04/23 1725 Oral     SpO2 06/04/23 1725 100 %     Weight 06/04/23 1726 151 lb (68.5 kg)     Height 06/04/23 1726 5\' 3"  (1.6 m)     Head Circumference --      Peak Flow --      Pain Score 06/04/23 1726 7     Pain Loc --      Pain Education --      Exclude from Growth Chart --     Most recent vital signs: Vitals:   06/04/23 1725  BP: (!) 114/55  Pulse: 70  Resp: 18  Temp: 98 F (36.7 C)  SpO2: 100%   I have reviewed the vital signs. General:  Awake, alert, no acute distress. Head:  Normocephalic, Atraumatic. EENT:  PERRL, EOMI, Oral mucosa pink and moist, Neck is supple. Cardiovascular: Regular rate, 2+ distal pulses. Respiratory:  Normal respiratory effort, symmetrical expansion, no distress.   Abdomen: Soft, notably distended,  nontender Extremities:  Moving all four extremities through full ROM without pain.   Neuro:  Alert and oriented.  Interacting appropriately.   Skin:  Warm, dry, no rash.   Psych: Appropriate affect.    ED Results / Procedures / Treatments   Labs (all labs ordered are listed, but only abnormal results are displayed) Labs Reviewed  COMPREHENSIVE METABOLIC PANEL WITH GFR - Abnormal; Notable for the following components:      Result Value   Glucose, Bld 132 (*)    Creatinine, Ser 1.05 (*)    Calcium 8.5 (*)    Albumin  3.3 (*)    GFR, Estimated 53 (*)    All other components within normal limits  CBC - Abnormal; Notable for the following components:   WBC 2.3 (*)    RBC 3.21 (*)    Hemoglobin 10.3 (*)    HCT 31.9 (*)    Platelets 81 (*)    All other components within normal limits  LIPASE, BLOOD  URINALYSIS, ROUTINE W REFLEX MICROSCOPIC     EKG    RADIOLOGY    PROCEDURES:  Critical Care performed: No  Procedures   MEDICATIONS ORDERED IN ED: Medications - No data to display   IMPRESSION / MDM / ASSESSMENT AND PLAN / ED COURSE  I reviewed the triage vital signs and the nursing notes.                              Differential diagnosis includes, but is not limited to, recurrent ascites  Patient's presentation is most consistent with exacerbation of chronic illness.  Patient is an 83 year old female with history of ascites presenting today for recurrence of the same.  Patient has had 2 paracentesis performed in the last 10 days both pulling off between 4.5 and 5 L of fluid each time.  She has had reaccumulation within 5 days again.  She denies any specific abdominal pain but is having worsening shortness of breath and difficulty ambulating as well as eating.  Seen by her oncology team who recommended her come in as she needs definitive treatment giving recurrent symptoms in such short duration.  Still not been able to establish with GI outpatient but I do see first  visit occurring in 6 days.  Will admit to hospitalist for IR to perform therapeutic paracentesis tomorrow and make sure she has a definitive plan before she gets discharged again.  The patient is on the cardiac monitor to evaluate for evidence of arrhythmia and/or significant heart rate changes.     FINAL CLINICAL IMPRESSION(S) / ED DIAGNOSES   Final diagnoses:  Other ascites     Rx / DC Orders   ED Discharge Orders     None        Note:  This document was prepared using Dragon voice recognition software and may include unintentional dictation errors.   Kandee Orion, MD 06/04/23 2016

## 2023-06-04 NOTE — Progress Notes (Unsigned)
 Pt feels weak, absolutely no energy. Last paracentesis on Saturday and she is already full of fluid again. 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 Not able to eat much food. Gets dyspneic as fluid builds.No visible blood noticed from anywhere this week. Wearing compression stockings for her swelling in feet most of the time.

## 2023-06-04 NOTE — Progress Notes (Unsigned)
 [facility] OFFICE PROGRESS NOTE  Patient Care Team: Westley Hammers, MD as PCP - General (Internal Medicine) End, Veryl Gottron, MD as PCP - Cardiology (Cardiology) Boyce Byes, MD as PCP - Electrophysiology (Cardiology) Gwyn Leos, MD as Consulting Physician (Internal Medicine)   Cancer Staging  No matching staging information was found for the patient.    Oncology History Overview Note  # AUG 2017- DCIS LEFT BREAST ER/PR- POS; positive margins [Dr.Smith]; s/p re-excision; s/p RT [finish RT Nov 10th 2017]; START ARIMIDEX - Jan 2018; Stopped in July 2018- sec to muscle cramps; Sep 2018- start Letrozole .;  JAN 2019-STOPPED Letrozole  sec night sweats/poor tolerance  # Mild pancytopenia [CTApril 2019-cirrhosis/spleneomegaly]. OCT 2018- BMBx- MILD dyspoiesis; variable cellularity [10-50%]; FISH/Cytogenetics-NORMAL; F-One-NGS-declined by insurance.    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   #Frequent UTIs [2019-2020; Dr. Brandon/uro-gyne,[facility]]; Dr.Wohl/  # HRT [clinical trial thru [facility]; stopped July 2017 ]; CPAP   DIAGNOSIS: Left breast DCIS  STAGE:    0     ;GOALS: cure  CURRENT/MOST RECENT THERAPY: surveillaince    Ductal carcinoma in situ (DCIS) of left breast    INTERVAL HISTORY: Alone.  Ambulating independently.  Annette Foster 83 y.o.  female pleasant patient above history of DCIS; decompensated cirrhosis portal hypertension; A. fib and pancytopenia is here for follow-up.  Patient was admitted to the hospital for decompensated cirrhosis status post paracentesis.  Today patient feels weak, absolutely no energy. Last paracentesis on Saturday and she is already full of fluid again. 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 Not able to eat much food.   Gets dyspneic as fluid builds.No  visible blood noticed from anywhere this week. Wearing compression stockings for her swelling in feet most of the time   Review of Systems  Constitutional:  Positive for malaise/fatigue. Negative for chills, diaphoresis, fever and weight loss.  HENT:  Negative for nosebleeds and sore throat.   Eyes:  Negative for double vision.  Respiratory:  Positive for shortness of breath. Negative for cough, hemoptysis, sputum production and wheezing.   Cardiovascular:  Positive for leg swelling. Negative for chest pain, palpitations and orthopnea.  Gastrointestinal:  Negative for abdominal pain, blood in stool, constipation, diarrhea, heartburn, melena, nausea and vomiting.  Musculoskeletal:  Positive for back pain and joint pain.  Skin: Negative.  Negative for itching and rash.  Neurological:  Negative for dizziness, tingling, focal weakness, weakness and headaches.  Endo/Heme/Allergies:  Does not bruise/bleed easily.  Psychiatric/Behavioral:  Negative for depression. The patient is not nervous/anxious and does not have insomnia.       PAST MEDICAL HISTORY :  Past Medical History:  Diagnosis Date   Abdominal pain    Allergy    Cancer (HCC) 2017   breast cancer- Left   Cataract    CHF (congestive heart failure) (HCC)    Collagenous colitis    Coronary artery disease    Diabetes mellitus without complication (HCC)    Diarrhea    Diverticulosis    Dysrhythmia    Fatty liver disease, nonalcoholic    Fibrocystic breast    GERD (gastroesophageal reflux disease)    Heart murmur    Hyperlipidemia    Hypertension    Hypothyroidism    IBS (irritable bowel syndrome)    IDA (iron  deficiency anemia)    Liver cirrhosis (HCC)    Personal history of radiation  therapy 2017   LEFT BREAST CA   PONV (postoperative nausea and vomiting)    Presence of permanent cardiac pacemaker    Sleep apnea    C-Pap    PAST SURGICAL HISTORY :   Past Surgical History:  Procedure Laterality Date   ABDOMINAL  HYSTERECTOMY     tah bso   ABDOMINAL SURGERY     APPENDECTOMY     AV NODE ABLATION N/A 05/17/2021   Procedure: AV NODE ABLATION;  Surgeon: Boyce Byes, MD;  [facility];  Service: Cardiovascular;  Laterality: N/A;   BREAST BIOPSY Bilateral 2016   negative   BREAST 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/2015   DCIS, papillary carcinoma in situ   BREAST BIOPSY Left 05/27/2016   BENIGN MAMMARY EPITHELIUM   BREAST BIOPSY Left 11/20/2017   affirm bx x clip BENIGN MAMMARY EPITHELIUM CONSISTENT WITH RAD THERAPY   BREAST EXCISIONAL BIOPSY Right    NEG 1980's   BREAST LUMPECTOMY Left 10/17/2015   DCIS and papillary carcinoma insitu, clear margins   CARDIAC SURGERY     has replacement valve   CATARACT EXTRACTION Right    CATARACT EXTRACTION W/PHACO Left 01/16/2021   Procedure: CATARACT EXTRACTION PHACO AND INTRAOCULAR LENS PLACEMENT (IOC) LEFT DIABETIC 8.46 00:59.8;  Surgeon: Clair Crews, MD;  Location: [facility];  Service: Ophthalmology;  Laterality: Left;   CHOLECYSTECTOMY     COLONOSCOPY WITH PROPOFOL  N/A 03/11/2016   Procedure: COLONOSCOPY WITH PROPOFOL ;  Surgeon: Cassie Click, MD;  Location: [facility] ENDOSCOPY;  Service: Endoscopy;  Laterality: N/A;   COLONOSCOPY WITH PROPOFOL  N/A 02/18/2020   Procedure: COLONOSCOPY WITH PROPOFOL ;  Surgeon: Marnee Sink, MD;  Location: [facility] ENDOSCOPY;  Service: Endoscopy;  Laterality: N/A;   COLONOSCOPY WITH PROPOFOL  N/A 09/10/2022   Procedure: COLONOSCOPY WITH PROPOFOL ;  Surgeon: Luke Salaam, MD;  Location: [facility] ENDOSCOPY;  Service: Gastroenterology;  Laterality: N/A;   ESOPHAGEAL BANDING  09/10/2022   Procedure: ESOPHAGEAL BANDING;  Surgeon: Luke Salaam, MD;  Location: [facility] ENDOSCOPY;  Service: Gastroenterology;;   ESOPHAGEAL BANDING  10/17/2022   Procedure: ESOPHAGEAL BANDING;  Surgeon: Marnee Sink, MD;  Location: [facility] ENDOSCOPY;  Service: Endoscopy;;   ESOPHAGEAL BANDING  12/24/2022   Procedure: ESOPHAGEAL BANDING;  Surgeon: Marnee Sink, MD;  Location: [facility] ENDOSCOPY;  Service: Endoscopy;;   ESOPHAGEAL BANDING  05/13/2023   Procedure: ESOPHAGOSCOPY, WITH ESOPHAGEAL VARICES BAND LIGATION;  Surgeon: Marnee Sink, MD;  Location: [facility] ENDOSCOPY;  Service: Endoscopy;;   ESOPHAGOGASTRODUODENOSCOPY N/A 05/13/2023   Procedure: EGD (ESOPHAGOGASTRODUODENOSCOPY);  Surgeon: Marnee Sink, MD;  Location: [facility] ENDOSCOPY;  Service: Endoscopy;  Laterality: N/A;   ESOPHAGOGASTRODUODENOSCOPY (EGD) WITH PROPOFOL  N/A 03/11/2016   Procedure: ESOPHAGOGASTRODUODENOSCOPY (EGD) WITH PROPOFOL ;  Surgeon: Cassie Click, MD;  Location: [facility] ENDOSCOPY;  Service: Endoscopy;  Laterality: N/A;   ESOPHAGOGASTRODUODENOSCOPY (EGD) WITH PROPOFOL  N/A 02/18/2020   Procedure: ESOPHAGOGASTRODUODENOSCOPY (EGD) WITH PROPOFOL ;  Surgeon: Marnee Sink, MD;  Location: [facility] ENDOSCOPY;  Service: Endoscopy;  Laterality: N/A;   ESOPHAGOGASTRODUODENOSCOPY (EGD) WITH PROPOFOL  N/A 04/25/2020   Procedure: ESOPHAGOGASTRODUODENOSCOPY (EGD) WITH PROPOFOL ;  Surgeon: Marnee Sink, MD;  Location: [facility] ENDOSCOPY;  Service: Endoscopy;  Laterality: N/A;   ESOPHAGOGASTRODUODENOSCOPY (EGD) WITH PROPOFOL  N/A 05/23/2020   Procedure: ESOPHAGOGASTRODUODENOSCOPY (EGD) WITH PROPOFOL ;  Surgeon: Marnee Sink, MD;  Location: [facility] ENDOSCOPY;  Service: Endoscopy;  Laterality: N/A;   ESOPHAGOGASTRODUODENOSCOPY (EGD) WITH PROPOFOL  N/A 09/10/2022   Procedure: ESOPHAGOGASTRODUODENOSCOPY (EGD) WITH PROPOFOL ;  Surgeon: Luke Salaam, MD;  Location: [facility] ENDOSCOPY;  Service: Gastroenterology;  Laterality: N/A;   ESOPHAGOGASTRODUODENOSCOPY (EGD)  WITH PROPOFOL  N/A 10/17/2022   Procedure: ESOPHAGOGASTRODUODENOSCOPY (EGD) WITH PROPOFOL ;  Surgeon: Marnee Sink, MD;  Location: [facility] ENDOSCOPY;  Service: Endoscopy;  Laterality: N/A;   ESOPHAGOGASTRODUODENOSCOPY (EGD) WITH PROPOFOL  N/A 12/24/2022   Procedure: ESOPHAGOGASTRODUODENOSCOPY (EGD) WITH PROPOFOL ;  Surgeon: Marnee Sink, MD;  Location: [facility] ENDOSCOPY;  Service:  Endoscopy;  Laterality: N/A;   ESOPHAGOGASTRODUODENOSCOPY (EGD) WITH PROPOFOL  N/A 01/29/2023   Procedure: ESOPHAGOGASTRODUODENOSCOPY (EGD) WITH PROPOFOL ;  Surgeon: Shane Darling, MD;  Location: [facility] ENDOSCOPY;  Service: Endoscopy;  Laterality: N/A;  Ideally we would intubate if possible given concern for varices   EUS N/A 10/04/2021   Procedure: LOWER ENDOSCOPIC ULTRASOUND (EUS);  Surgeon: Rayford Cake, MD;  Location: [facility] ENDOSCOPY;  Service: Gastroenterology;  Laterality: N/A;  [facility]   EYE SURGERY     FINGER ARTHROSCOPY WITH CARPOMETACARPEL ([facility]) ARTHROPLASTY Right 09/03/2018   Procedure: CARPOMETACARPEL [facility]) ARTHROPLASTY RIGHT THUMB;  Surgeon: Molli Angelucci, MD;  Location: [facility] ORS;  Service: Orthopedics;  Laterality: Right;   FLEXIBLE SIGMOIDOSCOPY N/A 09/14/2021   Procedure: FLEXIBLE SIGMOIDOSCOPY;  Surgeon: Marnee Sink, MD;  Location: [facility] ENDOSCOPY;  Service: Endoscopy;  Laterality: N/A;  No anesthesia   GANGLION CYST EXCISION Right 09/03/2018   Procedure: REMOVAL GANGLION OF WRIST;  Surgeon: Molli Angelucci, MD;  Location: [facility] ORS;  Service: Orthopedics;  Laterality: Right;   HARDWARE REMOVAL Right 09/03/2018   Procedure: HARDWARE REMOVAL RIGHT THUMB;  Surgeon: Molli Angelucci, MD;  Location: [facility] ORS;  Service: Orthopedics;  Laterality: Right;  staple removed   HEMOSTASIS CLIP PLACEMENT  09/10/2022   Procedure: HEMOSTASIS CLIP PLACEMENT;  Surgeon: Luke Salaam, MD;  Location: [facility] ENDOSCOPY;  Service: Gastroenterology;;   HEMOSTASIS CONTROL  09/10/2022   Procedure: HEMOSTASIS CONTROL;  Surgeon: Luke Salaam, MD;  Location: [facility] ENDOSCOPY;  Service: Gastroenterology;;   HEMOSTASIS CONTROL  01/29/2023   Procedure: HEMOSTASIS CONTROL;  Surgeon: Shane Darling, MD;  Location: [facility] ENDOSCOPY;  Service: Endoscopy;;   JOINT REPLACEMENT Left    TKR   left sinusplasty      MASTECTOMY, PARTIAL Left 10/17/2015   Procedure: MASTECTOMY PARTIAL REVISION;  Surgeon: Benancio Bracket,  MD;  Location: [facility] ORS;  Service: General;  Laterality: Left;   PACEMAKER IMPLANT N/A 03/23/2021   Procedure: PACEMAKER IMPLANT;  Surgeon: Boyce Byes, MD;  [facility];  Service: Cardiovascular;  Laterality: N/A;   PARTIAL MASTECTOMY WITH NEEDLE LOCALIZATION Left 09/29/2015   Procedure: PARTIAL MASTECTOMY WITH NEEDLE LOCALIZATION;  Surgeon: Benancio Bracket, MD;  Location: [facility] ORS;  Service: General;  Laterality: Left;   POLYPECTOMY  09/10/2022   Procedure: POLYPECTOMY;  Surgeon: Luke Salaam, MD;  Location: [facility] ENDOSCOPY;  Service: Gastroenterology;;   RECTAL EXAM UNDER ANESTHESIA N/A 10/05/2021   Procedure: RECTAL EXAM UNDER ANESTHESIA, ASPIRATION OF RECTAL CYST;  Surgeon: Eldred Grego, MD;  Location: [facility] ORS;  Service: General;  Laterality: N/A;   TOTAL ABDOMINAL HYSTERECTOMY W/ BILATERAL SALPINGOOPHORECTOMY      FAMILY HISTORY :   Family History  Problem Relation Age of Onset   Breast cancer Paternal Grandmother    Colon cancer Father    Diabetes Sister    Diabetes Brother    Heart disease Brother    Prostate cancer Brother    Colon cancer Maternal Uncle    Prostate cancer Brother    Bladder Cancer Brother    Leukemia Mother        all   Ovarian cancer Neg Hx    Kidney cancer Neg Hx  SOCIAL HISTORY:   Social History   Tobacco Use   Smoking status: Never   Smokeless tobacco: Never  Vaping Use   Vaping status: Never Used  Substance Use Topics   Alcohol use: Never   Drug use: Never    ALLERGIES:  is allergic to codeine, demeclocycline, demerol [meperidine], hydrocodone, morphine , other, oxycodone , pentazocine, tetracyclines & related, coal tar extract, hydrocodone-acetaminophen , salicylic acid, and tetracycline hcl.  MEDICATIONS:  No current facility-administered medications for this visit.   No current outpatient medications on file.   Facility-Administered Medications Ordered in Other Visits  Medication Dose Route  Frequency Provider Last Rate Last Admin   acetaminophen  (TYLENOL ) tablet 650 mg  650 mg Oral Q6H PRN Duncan, Hazel V, MD       Or   acetaminophen  (TYLENOL ) suppository 650 mg  650 mg Rectal Q6H PRN Duncan, Hazel V, MD       albumin  human 25 % solution 50 g  50 g Intravenous Once PRN Duncan, Hazel V, MD       albuterol  (PROVENTIL ) (2.5 MG/3ML) 0.083% nebulizer solution 2.5 mg  2.5 mg Inhalation Q4H PRN Duncan, Hazel V, MD       ALPRAZolam  (XANAX ) tablet 0.25 mg  0.25 mg Oral BID PRN Duncan, Hazel V, MD   0.25 mg at 06/05/23 0228   insulin  aspart (novoLOG ) injection 0-15 Units  0-15 Units Subcutaneous Q4H Lanetta Pion, MD   2 Units at 06/05/23 0012   insulin  glargine-yfgn (SEMGLEE ) injection 20 Units  20 Units Subcutaneous QHS Duncan, Hazel V, MD   20 Units at 06/05/23 1015   levothyroxine  (SYNTHROID ) tablet 75 mcg  75 mcg Oral QAC breakfast Duncan, Hazel V, MD   75 mcg at 06/05/23 1610   midodrine  (PROAMATINE ) tablet 5 mg  5 mg Oral TID WC Duncan, Hazel V, MD   5 mg at 06/05/23 1013   nadolol  (CORGARD ) tablet 20 mg  20 mg Oral Daily Duncan, Hazel V, MD   20 mg at 06/05/23 1014   ondansetron  (ZOFRAN ) tablet 4 mg  4 mg Oral Q6H PRN Duncan, Hazel V, MD       Or   ondansetron  (ZOFRAN ) injection 4 mg  4 mg Intravenous Q6H PRN Lanetta Pion, MD       potassium chloride  10 mEq in 100 mL IVPB  10 mEq Intravenous Q1 Hr x 4 Ramonita Burow, RPH 100 mL/hr at 06/05/23 1022 10 mEq at 06/05/23 1022   simvastatin  (ZOCOR ) tablet 20 mg  20 mg Oral QHS Duncan, Hazel V, MD   20 mg at 06/04/23 2208   spironolactone  (ALDACTONE ) tablet 50 mg  50 mg Oral Daily Verla Glaze, MD   50 mg at 06/05/23 1013   [START ON 06/06/2023] torsemide  (DEMADEX ) tablet 40 mg  40 mg Oral Daily Verla Glaze, MD       traMADol  (ULTRAM ) tablet 50 mg  50 mg Oral Q8H PRN Lanetta Pion, MD        PHYSICAL EXAMINATION: ECOG PERFORMANCE STATUS: 1 - Symptomatic but completely ambulatory  BP (!) 117/57 (BP Location: Left Arm,  Patient Position: Sitting)   Pulse 79   Temp (!) 97.4 F (36.3 C) (Tympanic)   Resp 18   Wt 151 lb (68.5 kg)   LMP  (LMP Unknown)   SpO2 100%   BMI 26.75 kg/m   Filed Weights   06/04/23 1312  Weight: 151 lb (68.5 kg)    Physical Exam Constitutional:  Comments: Alone.  Ambulating independently.  HENT:     Head: Normocephalic and atraumatic.     Mouth/Throat:     Pharynx: No oropharyngeal exudate.  Eyes:     Pupils: Pupils are equal, round, and reactive to light.  Cardiovascular:     Rate and Rhythm: Tachycardia present. Rhythm irregular.     Heart sounds: Murmur heard.  Pulmonary:     Effort: Pulmonary effort is normal. No respiratory distress.     Breath sounds: Normal breath sounds. No wheezing.  Abdominal:     General: Bowel sounds are normal. There is no distension.     Palpations: Abdomen is soft. There is no mass.     Tenderness: There is no abdominal tenderness. There is no guarding or rebound.     Comments: Positive for splenomegaly.  Musculoskeletal:        General: No tenderness. Normal range of motion.     Cervical back: Normal range of motion and neck supple.  Skin:    General: Skin is warm.  Neurological:     Mental Status: She is alert and oriented to person, place, and time.  Psychiatric:        Mood and Affect: Affect normal.    Positive for abdominal distension  LABORATORY DATA:  I have reviewed the data as listed    Component Value Date/Time   NA 140 06/05/2023 0407   NA 138 10/03/2022 1509   K 2.7 (LL) 06/05/2023 0407   CL 105 06/05/2023 0407   CO2 28 06/05/2023 0407   GLUCOSE 98 06/05/2023 0407   BUN 18 06/05/2023 0407   BUN 15 10/03/2022 1509   CREATININE 1.08 (H) 06/05/2023 0407   CREATININE 1.09 (H) 06/04/2023 1250   CALCIUM 8.1 (L) 06/05/2023 0407   PROT 5.9 (L) 06/05/2023 0407   PROT 7.1 11/30/2020 0944   ALBUMIN  2.7 (L) 06/05/2023 0407   ALBUMIN  3.8 11/30/2020 0944   AST 30 06/05/2023 0407   AST 34 06/04/2023 1250    ALT 18 06/05/2023 0407   ALT 22 06/04/2023 1250   ALKPHOS 39 06/05/2023 0407   BILITOT 0.7 06/05/2023 0407   BILITOT 1.1 06/04/2023 1250   GFRNONAA 51 (L) 06/05/2023 0407   GFRNONAA 51 (L) 06/04/2023 1250   GFRAA 80 02/25/2020 1105    No results found for: "SPEP", "UPEP"  Lab Results  Component Value Date   WBC 1.5 (L) 06/05/2023   NEUTROABS 1.2 (L) 06/04/2023   HGB 8.6 (L) 06/05/2023   HCT 25.4 (L) 06/05/2023   MCV 94.1 06/05/2023   PLT 65 (L) 06/05/2023      Chemistry      Component Value Date/Time   NA 140 06/05/2023 0407   NA 138 10/03/2022 1509   K 2.7 (LL) 06/05/2023 0407   CL 105 06/05/2023 0407   CO2 28 06/05/2023 0407   BUN 18 06/05/2023 0407   BUN 15 10/03/2022 1509   CREATININE 1.08 (H) 06/05/2023 0407   CREATININE 1.09 (H) 06/04/2023 1250      Component Value Date/Time   CALCIUM 8.1 (L) 06/05/2023 0407   ALKPHOS 39 06/05/2023 0407   AST 30 06/05/2023 0407   AST 34 06/04/2023 1250   ALT 18 06/05/2023 0407   ALT 22 06/04/2023 1250   BILITOT 0.7 06/05/2023 0407   BILITOT 1.1 06/04/2023 1250       RADIOGRAPHIC STUDIES: I have personally reviewed the radiological images as listed and agreed with the findings in the report. No results found.  ASSESSMENT & PLAN:  Pancytopenia (HCC) # Anemia-secondary GI bleed chronic/liver disease.  Hemoglobin-10- [Jan 2025- UGIB][Recent Upper GIB]; cannot tolerate PO iron .   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  ANC-1000;  Platelets 50-60s  stable.  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 Discussed with Dr.Locklear- re: pleurex catheter-defer to GI for further recommendations.. Prognosis- poor. Discussed life expectancy of 1 year or so. Child  pugh score- 9.  Given the worsening ascites recommend evaluation in the emergency room for paracentesis.  Patient will ask her daughter to bring her to the emergency room at the end of the day today.  # Tachy-brady syndrome- Hx of A.fib [Dr.End; no wtachnam device]- no anticoagulation.  stable.     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 BIL Mammogram JAN 2023 [Dr.Cintron]-within normal limits.  Not on AI.  Stable.   # DISPOSITION:   # HOLD  Venofer  today # follow up in 2-3 weeks- MD; labs- cbc/cmp;Hold tube.]; possible Venofer -Dr.B  Addendum: After discussion with GI agree with palliative care evaluation.  Discussed with Dr. Annelle Kiel.  Will schedule with Josh at next visit.  Cc; Dr.Tate     Orders Placed This Encounter  Procedures   CBC with Differential (Cancer Center Only)    Standing Status:   Future    Expected Date:   06/18/2023    Expiration Date:   06/03/2024   CMP (Cancer Center only)    Standing Status:   Future    Expected Date:   06/18/2023    Expiration Date:   06/03/2024   Sample to Blood Bank    Standing Status:   Future    Expected Date:   06/18/2023    Expiration Date:   06/03/2024   All questions were answered. The patient knows to call the clinic with any problems, questions or concerns.      Gwyn Leos, MD 06/05/2023 10:46 AM

## 2023-06-04 NOTE — Assessment & Plan Note (Addendum)
#   Anemia-secondary GI bleed chronic/liver disease.  Hemoglobin-10- [Jan 2025- UGIB][Recent Upper GIB]; cannot tolerate PO iron .   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  ANC-1000;  Platelets 50-60s  stable.  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 Discussed with Dr.Locklear- re: pleurex catheter-defer to GI for further recommendations.. Prognosis- poor. Discussed life expectancy of 1 year or so. Child pugh score- 9.  Given the worsening ascites recommend evaluation in the emergency room for paracentesis.  Patient will ask her daughter to bring her to the emergency room at the end of the day today.  # Tachy-brady syndrome- Hx of A.fib [Dr.End; no wtachnam device]- no anticoagulation.  stable.     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 BIL Mammogram JAN 2023 [Dr.Cintron]-within normal limits.  Not on AI.  Stable.   # DISPOSITION:   # HOLD  Venofer  today # follow up in 2-3 weeks- MD; labs- cbc/cmp;Hold tube.]; possible Venofer -Dr.B  Addendum: After discussion with GI agree with palliative care evaluation.  Discussed with Dr. Annelle Kiel.  Will schedule with Josh at next visit.  Cc; Dr.Tate

## 2023-06-04 NOTE — ED Triage Notes (Signed)
 Pt presents to the ED POV from home for abdominal swelling. Pt had a paracentesis on Saturday. Pt has had multiple paracentesis procedures recently. Pt's abdomen distended and firm. Pt reports SHOB.

## 2023-06-05 ENCOUNTER — Other Ambulatory Visit: Payer: Self-pay

## 2023-06-05 ENCOUNTER — Encounter: Payer: Self-pay | Admitting: Internal Medicine

## 2023-06-05 ENCOUNTER — Observation Stay

## 2023-06-05 DIAGNOSIS — D61818 Other pancytopenia: Secondary | ICD-10-CM

## 2023-06-05 DIAGNOSIS — E785 Hyperlipidemia, unspecified: Secondary | ICD-10-CM | POA: Diagnosis present

## 2023-06-05 DIAGNOSIS — I493 Ventricular premature depolarization: Secondary | ICD-10-CM | POA: Diagnosis present

## 2023-06-05 DIAGNOSIS — Z794 Long term (current) use of insulin: Secondary | ICD-10-CM | POA: Diagnosis not present

## 2023-06-05 DIAGNOSIS — K7460 Unspecified cirrhosis of liver: Secondary | ICD-10-CM | POA: Diagnosis present

## 2023-06-05 DIAGNOSIS — R1084 Generalized abdominal pain: Secondary | ICD-10-CM | POA: Diagnosis not present

## 2023-06-05 DIAGNOSIS — E039 Hypothyroidism, unspecified: Secondary | ICD-10-CM

## 2023-06-05 DIAGNOSIS — E876 Hypokalemia: Secondary | ICD-10-CM

## 2023-06-05 DIAGNOSIS — R188 Other ascites: Secondary | ICD-10-CM | POA: Diagnosis present

## 2023-06-05 DIAGNOSIS — K7682 Hepatic encephalopathy: Secondary | ICD-10-CM | POA: Diagnosis present

## 2023-06-05 DIAGNOSIS — I2510 Atherosclerotic heart disease of native coronary artery without angina pectoris: Secondary | ICD-10-CM | POA: Diagnosis present

## 2023-06-05 DIAGNOSIS — R1010 Upper abdominal pain, unspecified: Secondary | ICD-10-CM | POA: Diagnosis not present

## 2023-06-05 DIAGNOSIS — I5032 Chronic diastolic (congestive) heart failure: Secondary | ICD-10-CM | POA: Diagnosis present

## 2023-06-05 DIAGNOSIS — K7581 Nonalcoholic steatohepatitis (NASH): Secondary | ICD-10-CM | POA: Diagnosis present

## 2023-06-05 DIAGNOSIS — E118 Type 2 diabetes mellitus with unspecified complications: Secondary | ICD-10-CM | POA: Diagnosis present

## 2023-06-05 DIAGNOSIS — K5900 Constipation, unspecified: Secondary | ICD-10-CM | POA: Diagnosis not present

## 2023-06-05 DIAGNOSIS — K766 Portal hypertension: Secondary | ICD-10-CM | POA: Diagnosis present

## 2023-06-05 DIAGNOSIS — N179 Acute kidney failure, unspecified: Secondary | ICD-10-CM | POA: Diagnosis present

## 2023-06-05 DIAGNOSIS — T500X5A Adverse effect of mineralocorticoids and their antagonists, initial encounter: Secondary | ICD-10-CM | POA: Diagnosis present

## 2023-06-05 DIAGNOSIS — K219 Gastro-esophageal reflux disease without esophagitis: Secondary | ICD-10-CM | POA: Diagnosis present

## 2023-06-05 DIAGNOSIS — G4733 Obstructive sleep apnea (adult) (pediatric): Secondary | ICD-10-CM | POA: Diagnosis present

## 2023-06-05 DIAGNOSIS — D689 Coagulation defect, unspecified: Secondary | ICD-10-CM | POA: Diagnosis present

## 2023-06-05 DIAGNOSIS — I9589 Other hypotension: Secondary | ICD-10-CM

## 2023-06-05 DIAGNOSIS — K3189 Other diseases of stomach and duodenum: Secondary | ICD-10-CM | POA: Diagnosis present

## 2023-06-05 DIAGNOSIS — I110 Hypertensive heart disease with heart failure: Secondary | ICD-10-CM | POA: Diagnosis present

## 2023-06-05 DIAGNOSIS — F419 Anxiety disorder, unspecified: Secondary | ICD-10-CM | POA: Diagnosis present

## 2023-06-05 DIAGNOSIS — I959 Hypotension, unspecified: Secondary | ICD-10-CM | POA: Insufficient documentation

## 2023-06-05 DIAGNOSIS — D509 Iron deficiency anemia, unspecified: Secondary | ICD-10-CM | POA: Diagnosis present

## 2023-06-05 LAB — COMPREHENSIVE METABOLIC PANEL WITH GFR
ALT: 18 U/L (ref 0–44)
AST: 30 U/L (ref 15–41)
Albumin: 2.7 g/dL — ABNORMAL LOW (ref 3.5–5.0)
Alkaline Phosphatase: 39 U/L (ref 38–126)
Anion gap: 7 (ref 5–15)
BUN: 18 mg/dL (ref 8–23)
CO2: 28 mmol/L (ref 22–32)
Calcium: 8.1 mg/dL — ABNORMAL LOW (ref 8.9–10.3)
Chloride: 105 mmol/L (ref 98–111)
Creatinine, Ser: 1.08 mg/dL — ABNORMAL HIGH (ref 0.44–1.00)
GFR, Estimated: 51 mL/min — ABNORMAL LOW (ref >60)
Glucose, Bld: 98 mg/dL (ref 70–99)
Potassium: 2.7 mmol/L — CL (ref 3.5–5.1)
Sodium: 140 mmol/L (ref 135–145)
Total Bilirubin: 0.7 mg/dL (ref 0.0–1.2)
Total Protein: 5.9 g/dL — ABNORMAL LOW (ref 6.5–8.1)

## 2023-06-05 LAB — URINALYSIS, ROUTINE W REFLEX MICROSCOPIC
Bilirubin Urine: NEGATIVE
Glucose, UA: NEGATIVE mg/dL
Hgb urine dipstick: NEGATIVE
Ketones, ur: NEGATIVE mg/dL
Leukocytes,Ua: NEGATIVE
Nitrite: NEGATIVE
Protein, ur: NEGATIVE mg/dL
Specific Gravity, Urine: 1.009 (ref 1.005–1.030)
pH: 6 (ref 5.0–8.0)

## 2023-06-05 LAB — GLUCOSE, CAPILLARY
Glucose-Capillary: 87 mg/dL (ref 70–99)
Glucose-Capillary: 82 mg/dL (ref 70–99)
Glucose-Capillary: 77 mg/dL (ref 70–99)
Glucose-Capillary: 112 mg/dL — ABNORMAL HIGH (ref 70–99)

## 2023-06-05 LAB — CBC
HCT: 25.4 % — ABNORMAL LOW (ref 36.0–46.0)
Hemoglobin: 8.6 g/dL — ABNORMAL LOW (ref 12.0–15.0)
MCH: 31.9 pg (ref 26.0–34.0)
MCHC: 33.9 g/dL (ref 30.0–36.0)
MCV: 94.1 fL (ref 80.0–100.0)
Platelets: 65 10*3/uL — ABNORMAL LOW (ref 150–400)
RBC: 2.7 MIL/uL — ABNORMAL LOW (ref 3.87–5.11)
RDW: 15 % (ref 11.5–15.5)
WBC: 1.5 10*3/uL — ABNORMAL LOW (ref 4.0–10.5)
nRBC: 0 % (ref 0.0–0.2)

## 2023-06-05 LAB — AFP TUMOR MARKER: AFP, Serum, Tumor Marker: 2.9 ng/mL (ref 0.0–8.7)

## 2023-06-05 LAB — CBG MONITORING, ED: Glucose-Capillary: 150 mg/dL — ABNORMAL HIGH (ref 70–99)

## 2023-06-05 MED ORDER — ALPRAZOLAM 0.25 MG PO TABS
0.2500 mg | ORAL_TABLET | Freq: Two times a day (BID) | ORAL | Status: DC | PRN
  Administered 2023-06-05 – 2023-06-16 (×9): 0.25 mg via ORAL
  Filled 2023-06-05 (×10): qty 1

## 2023-06-05 MED ORDER — INSULIN GLARGINE-YFGN 100 UNIT/ML ~~LOC~~ SOLN
5.0000 [IU] | Freq: Every day | SUBCUTANEOUS | Status: DC
  Administered 2023-06-06 – 2023-06-08 (×3): 5 [IU] via SUBCUTANEOUS
  Filled 2023-06-05 (×4): qty 0.05

## 2023-06-05 MED ORDER — TORSEMIDE 20 MG PO TABS
40.0000 mg | ORAL_TABLET | Freq: Every day | ORAL | Status: DC
  Administered 2023-06-06 – 2023-06-17 (×11): 40 mg via ORAL
  Filled 2023-06-05 (×12): qty 2

## 2023-06-05 MED ORDER — SODIUM CHLORIDE 0.9 % IV SOLN
12.5000 mg | Freq: Once | INTRAVENOUS | Status: AC
  Administered 2023-06-05: 12.5 mg via INTRAVENOUS
  Filled 2023-06-05: qty 0.5
  Filled 2023-06-05: qty 12.5

## 2023-06-05 MED ORDER — POTASSIUM CHLORIDE 10 MEQ/100ML IV SOLN
10.0000 meq | INTRAVENOUS | Status: DC
  Administered 2023-06-05: 10 meq via INTRAVENOUS

## 2023-06-05 MED ORDER — POTASSIUM CHLORIDE 10 MEQ/100ML IV SOLN: 10.0000 meq | INTRAVENOUS | Status: DC

## 2023-06-05 MED ORDER — POTASSIUM CHLORIDE CRYS ER 20 MEQ PO TBCR
40.0000 meq | EXTENDED_RELEASE_TABLET | Freq: Once | ORAL | Status: AC
  Administered 2023-06-05: 40 meq via ORAL
  Filled 2023-06-05: qty 2

## 2023-06-05 MED ORDER — INSULIN GLARGINE-YFGN 100 UNIT/ML ~~LOC~~ SOLN: 8.0000 [IU] | Freq: Every day | SUBCUTANEOUS | Status: DC

## 2023-06-05 MED ORDER — LIDOCAINE HCL (PF) 1 % IJ SOLN
10.0000 mL | Freq: Once | INTRAMUSCULAR | Status: AC
  Administered 2023-06-05: 10 mL via INTRADERMAL
  Filled 2023-06-05: qty 10

## 2023-06-05 MED ORDER — INSULIN ASPART 100 UNIT/ML IJ SOLN
0.0000 [IU] | Freq: Three times a day (TID) | INTRAMUSCULAR | Status: DC
  Administered 2023-06-06 – 2023-06-17 (×6): 1 [IU] via SUBCUTANEOUS
  Filled 2023-06-05 (×7): qty 1

## 2023-06-05 MED ORDER — SPIRONOLACTONE 25 MG PO TABS
50.0000 mg | ORAL_TABLET | Freq: Every day | ORAL | Status: DC
  Administered 2023-06-05 – 2023-06-17 (×12): 50 mg via ORAL
  Filled 2023-06-05 (×13): qty 2

## 2023-06-05 MED ORDER — INSULIN ASPART 100 UNIT/ML IJ SOLN: 0.0000 [IU] | Freq: Every day | INTRAMUSCULAR | Status: DC

## 2023-06-05 MED ORDER — PANTOPRAZOLE SODIUM 40 MG IV SOLR
40.0000 mg | Freq: Two times a day (BID) | INTRAVENOUS | Status: DC
  Administered 2023-06-05 – 2023-06-10 (×10): 40 mg via INTRAVENOUS
  Filled 2023-06-05 (×10): qty 10

## 2023-06-05 NOTE — Assessment & Plan Note (Addendum)
 Paracentesis 4/24 removed 4.1 L of fluid.  Continue spironolactone  and Demadex .  TIPS procedure done 4/28 along with another paracentesis of 1.5 L.  Patient was given empiric course of Rocephin  after the TIPS procedure because she was not feeling well.  Repeat ultrasound on 4/30 did not show enough fluid to be drawn off.

## 2023-06-05 NOTE — Assessment & Plan Note (Signed)
 Last EF 55%.  Torsemide  and nadolol 

## 2023-06-05 NOTE — Progress Notes (Signed)
 Approximately 1810--This RN was called to pt's room for complaints of 10/10 upper abdominal pain. Paracentesis performed earlier. Pt stated that she felt fine eating dinner and then felt "severe pain and nausea." Paracentesis site dressing CDI. MD Clelia Current and [place] notified.  Orders placed for IV Phenergan  and Tramadol  for pain.  Pt reports pain relief after receiving IV Phenergan  and having small BM. This RN to continue to monitor.

## 2023-06-05 NOTE — Progress Notes (Signed)
       CROSS COVER NOTE  NAME: Annette Foster MRN: [id number] DOB : [birth year] ATTENDING PHYSICIAN: Verla Glaze, MD    Date of Service   06/05/2023   HPI/Events of Note   Message received from Auther Legacy RN via secure chat at 2150 pm "This patient was admitted on 4/23 with Ascites ABD pain N/V. Extensive HX DM, HTN, HLD, Diverticulitis, Left breast CA, Hypothyroidism, Non Alcoholic Cirrhosis. grade 2 esophageal varices, banding, pacer. She has been going to ONC office 3x a week for paracentesis for continual accumulation of fluid. She had a paracentesis today and they removed 4 L. She is still distended. She has several allergies. Her PRN order is for Ultram  50 mg pain scale 4-6. She is rating pain at 10/10. May I have a higher dose of ultram . TY "  Interventions   Assessment/Plan: Ultram  50 mg po every 8 h was pain management at home prior to admit Ultram  50 mg given 2125 pm Reevaluate pain 1 hour post oral med admin         Kip Peon NP [facility] Cross Cover 7pm-7am - check amion for availability Pager [phone]

## 2023-06-05 NOTE — Assessment & Plan Note (Signed)
On midodrine 

## 2023-06-05 NOTE — Plan of Care (Signed)

## 2023-06-05 NOTE — Progress Notes (Signed)
       CROSS COVER NOTE  NAME: Annette Foster MRN: [id number] DOB : January 05, 1941 ATTENDING PHYSICIAN: Verla Glaze, MD    Date of Service   06/05/2023   HPI/Events of Note   Annette Foster 409 admitted 4/23 pm, for ascites has a critical potassium of 2.7   Interventions   Assessment/Plan: Replete Potassium 40 meQ IV  Potassium 40 MeQ oral        Annette Peon NP [facility] Cross Cover 7pm-7am - check amion for availability Pager [phone]

## 2023-06-05 NOTE — Assessment & Plan Note (Signed)
 Continue levothyroxine

## 2023-06-05 NOTE — TOC CM/SW Note (Addendum)
 Transition of Care [facility]) - Inpatient Brief Assessment   Patient Details  Name: Annette Foster MRN: [id number] Date of Birth: 1940/07/10  Transition of Care [facility]) CM/SW Contact:    Loman Risk, RN Phone Number: 06/05/2023, 12:45 PM   Clinical Narrative:  Transition of Care [facility] [address]) Screening Note   Patient Details  Name: Annette Foster Date of Birth: 1940-04-26   Transition of Care [facility]) CM/SW Contact:    Loman Risk, RN Phone Number: 06/05/2023, 12:45 PM    Transition of Care Department [facility]) has reviewed patient and no TOC needs have been identified at this time.If new patient transition needs arise, please place a TOC consult.   Please place consult if pleurx to be placed or other needs arise     Transition of Care Asessment: Insurance and Status: Insurance coverage has been reviewed Patient has primary care physician: Yes     Prior/Current Home Services: No current home services Social Drivers of Health Review: SDOH reviewed no interventions necessary Readmission risk has been reviewed: Yes Transition of care needs: no transition of care needs at this time

## 2023-06-05 NOTE — Progress Notes (Signed)
 Progress Note   Patient: Annette Foster MWN:[id number] DOB: 11/22/1940 DOA: 06/04/2023     0 DOS: the patient was seen and examined on 06/05/2023   Brief hospital course: 83 y.o. 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  She last underwent paracentesis twice in the past 2 weeks on 4/10 and 4/20 with removal of 4.8 And 4.2 L respectively but has had rapid reaccumulation of fluid in spite of compliance with medication.  Patient states that her oncologist referred her to GI for consideration of Pleurx drain but appointment is far away. ED course and data review: Soft BP at 114/55 with otherwise normal vitals Labs with baseline pancytopenia, WBC 2.3, hemoglobin 10.3 and platelets 81.  Creatinine 1.05 from baseline of of 0.82 Lipase and LFTs WNL EKG, personally viewed and interpreted showing dual paced rhythm with occasional PVCs at rate of 74 No imaging done Hospitalist consulted for admission.  4/24.  Patient frustrated that she has recurrent ascites and ends up needing to come back to the hospital.  Paracentesis today removed 4.1 L.  GI made referral to interventional radiology for TIPS procedure for Monday.  Assessment and Plan: * Ascites Recurrent ascites.  Paracentesis today removed 4.1 L of fluid.  Increase spironolactone  to 50 mg daily and can restart Demadex  tomorrow.  GI put out referral to interventional radiology for TIPS procedure for Monday.  Hypokalemia Replace potassium orally today.  Hold Demadex  today.  Liver cirrhosis secondary to NASH [facility]) Patient with pancytopenia, mild coagulopathy with an INR of  1.3, recurrent ascites, history of esophageal varices.  Continue nadolol .  Pancytopenia (HCC) Secondary liver disease  Hypothyroidism, unspecified Continue levothyroxine   Chronic diastolic CHF (congestive heart failure) (HCC) Last EF 55%.  Restart torsemide  tomorrow.  Hyperlipidemia, unspecified Continue Zocor   Hypotension On midodrine   Type 2 diabetes mellitus with complication, with long-term current use of insulin  (HCC) Last hemoglobin A1c back in January at 5.6.  Sugars on the lower side we will cut back on long-acting insulin  to 5 units at daily instead of 20.  Will put on sliding scale insulin         Subjective: Patient frustrated that she keeps on coming back to the hospital with swelling of the abdomen.  Patient has recurrent ascites.  History of liver cirrhosis.  Physical Exam: Vitals:   06/05/23 0100 06/05/23 0209 06/05/23 0746 06/05/23 1507  BP: (!) 116/57 (!) 109/47 (!) 101/46 (!) 110/42  Pulse: 80 64 66 63  Resp: 15 20 18 18   Temp:  97.9 F (36.6 C) 98.1 F (36.7 C) 98.2 F (36.8 C)  TempSrc:  Oral Oral   SpO2: 100% 100% 94%   Weight:      Height:       Physical Exam HENT:     Head: Normocephalic.  Eyes:     General: Lids are normal.     Conjunctiva/sclera: Conjunctivae normal.  Cardiovascular:     Rate and Rhythm: Normal rate and regular rhythm.     Heart sounds: Normal heart sounds, S1 normal and S2 normal.  Pulmonary:     Breath sounds: Examination of the right-lower field reveals decreased breath sounds. Examination of the left-lower field reveals decreased breath sounds. Decreased breath sounds  present. No wheezing, rhonchi or rales.  Abdominal:     General: There is distension.     Palpations: Abdomen is soft.     Tenderness: There is generalized abdominal tenderness.  Musculoskeletal:     Right lower leg: No swelling.     Left lower leg: No swelling.  Skin:    General: Skin is warm.     Findings: No rash.  Neurological:     Mental  Status: She is alert and oriented to person, place, and time.     Data Reviewed: Potassium 2.7, creatinine 1.08, GFR 51, ferritin 268, white blood count 1.5, hemoglobin 8.6, platelet count 65  Family Communication: updated daughter on the phone.  Disposition: Status is: Inpatient Remains inpatient appropriate because: Interventional radiology planning on TIPS procedure on Monday  Planned Discharge Destination: Home    Time spent: 28 minutes  Author: Verla Glaze, MD 06/05/2023 4:59 PM  For on call review [email or web address].

## 2023-06-05 NOTE — Procedures (Signed)
 PROCEDURE SUMMARY:  Successful ultrasound guided paracentesis from the right lower quadrant.  Yielded 4.1 L of clear yellow fluid.  No immediate complications.  The patient tolerated the procedure well.   Specimen was not sent for labs.  EBL < 5mL  The patient has required >/=2 paracenteses in a 30 day period and a screening evaluation by the [facility] has been arranged.  Nathan Bake, PA-C

## 2023-06-05 NOTE — Assessment & Plan Note (Addendum)
 Last hemoglobin A1c back in January at 5.6.  Patient does not need 20 units of long-acting insulin .  Can go home on 5 units long-acting insulin  and sliding scale.

## 2023-06-05 NOTE — Progress Notes (Signed)
 Patient with acute abdominal pain after paracentesis with nausea.  Had a normal bowel movement.  Phenergan  running.  Has side effect to most pain medications but can take tramadol .  Protonix  ordered also.  Physical Exam Abdominal:     General: There is distension.     Palpations: Abdomen is soft.     Tenderness: There is generalized abdominal tenderness.    Plan:  supportive care with phenergan  once, prn tramadol , can use tylenol   Dr Clelia Current 20 minutes Case discussed with nursing staff

## 2023-06-05 NOTE — Assessment & Plan Note (Addendum)
 Secondary liver disease, improved after TIPS procedure..  Last white blood cell count 6.0.  Last hemoglobin 11.1.  Last platelet count 114.

## 2023-06-05 NOTE — Hospital Course (Addendum)
 83 y.o. 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  She last underwent paracentesis twice in the past 2 weeks on 4/10 and 4/20 with removal of 4.8 And 4.2 L respectively but has had rapid reaccumulation of fluid in spite of compliance with medication.  Patient states that her oncologist referred her to GI for consideration of Pleurx drain but appointment is far away. ED course and data review: Soft BP at 114/55 with otherwise normal vitals Labs with baseline pancytopenia, WBC 2.3, hemoglobin 10.3 and platelets 81.  Creatinine 1.05 from baseline of of 0.82 Lipase and LFTs WNL EKG, personally viewed and interpreted showing dual paced rhythm with occasional PVCs at rate of 74 No imaging done Hospitalist consulted for admission.  4/24.  Patient frustrated that she has recurrent ascites and ends up needing to come back to the hospital.  Paracentesis today removed 4.1 L.  GI made referral to interventional radiology for TIPS procedure for Monday.  Patient had quite a bit of pain after paracentesis. 4/25.  Patient feeling better today.  Not having any abdominal pain that she had last night.  Still need to replace potassium. 4/26.  Patient's abdomen is still very distended.  Looking forward to trying a TIPS procedure to see if this can help out with her recurrent ascites. 4/27.  When I walked in she had the nausea bag in front of her.  Did not eat breakfast.  I ordered a dose of Phenergan  prior to lunch.  When she gets distended she can eat. 4/28.  Patient feels distended.  Patient had TIPS procedure and 1.5 L with paracentesis. 4/29.   Patient not feeling well today.  Having abdominal pain.  Will give empiric Rocephin .  Will order for another paracentesis for tomorrow and will send off cell counts.  4/30 -- U/S today with IR showed no significant ascites.  K 3.4. 5/1 -- pt nauseated this AM requiring IV Zofran . K again 3.4 5/2 -- ongoing abdominal pain and tenderness, no BM since 4/29 5/3 -- persistent abdominal pain, nausea, had BM, U/S no ascites 5/4 -- CTA showed constipation as likely cause of her abdominal pain  5/7.  Patient feeling better and eating little bit more.  Advised that she may need to eat throughout the day since she is only able to eat a little at a time.

## 2023-06-05 NOTE — Assessment & Plan Note (Addendum)
 Replaced.  Continue spironolactone .

## 2023-06-05 NOTE — Assessment & Plan Note (Signed)
 Continue Zocor

## 2023-06-05 NOTE — Consult Note (Signed)
 Marnee Sink, MD [facility]  [address] [place], [place] [address] Phone: [phone] Fax : [phone]  Consultation  Referring Provider:     Dr. Vallarie Gauze Primary Care Physician:  Westley Hammers, MD Primary Gastroenterologist:  Dr. Ole Berkeley         Reason for Consultation:     Refractory ascites  Date of Admission:  06/04/2023 Date of Consultation:  06/05/2023         HPI:   Annette Foster is a 83 y.o. 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  The patient also has a history of anemia and has been getting iron  infusions.  The patient has required 3 recent paracenteses in the last 2 weeks.  The patient now comes in for bloating with recurrent ascites.  The patient has been followed by hematology with the hematologist calling Dr. Emerick Hanlon for further recommendations due to the patient transferring care since I am no longer seeing people in the office. The patient has had 4.5 and 5 L of fluid removed during 2 of the recent paracentesis and reaccumulation within 5 days.  Past Medical History:  Diagnosis Date   Abdominal pain    Allergy    Cancer (HCC) 2017   breast cancer- Left   Cataract    CHF (congestive heart failure) (HCC)    Collagenous colitis    Coronary artery disease    Diabetes mellitus without complication (HCC)    Diarrhea    Diverticulosis    Dysrhythmia    Fatty liver disease, nonalcoholic    Fibrocystic breast    GERD (gastroesophageal reflux disease)    Heart murmur    Hyperlipidemia    Hypertension    Hypothyroidism    IBS (irritable bowel syndrome)    IDA (iron  deficiency anemia)    Liver cirrhosis (HCC)    Personal history of radiation therapy 2017   LEFT BREAST CA   PONV (postoperative nausea and vomiting)    Presence of permanent cardiac pacemaker    Sleep apnea    C-Pap    Past Surgical History:  Procedure Laterality  Date   ABDOMINAL HYSTERECTOMY     tah bso   ABDOMINAL SURGERY     APPENDECTOMY     AV NODE ABLATION N/A 05/17/2021   Procedure: AV NODE ABLATION;  Surgeon: Boyce Byes, MD;  [facility];  Service: Cardiovascular;  Laterality: N/A;   BREAST BIOPSY Bilateral 2016   negative   BREAST 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/2015   DCIS, papillary carcinoma in situ   BREAST BIOPSY Left 05/27/2016   BENIGN MAMMARY EPITHELIUM   BREAST BIOPSY Left 11/20/2017   affirm bx x clip BENIGN MAMMARY EPITHELIUM CONSISTENT WITH RAD THERAPY   BREAST EXCISIONAL BIOPSY Right    NEG 1980's   BREAST LUMPECTOMY Left 10/17/2015   DCIS and papillary carcinoma insitu, clear margins   CARDIAC SURGERY     has replacement valve   CATARACT EXTRACTION Right    CATARACT EXTRACTION W/PHACO Left 01/16/2021   Procedure: CATARACT EXTRACTION PHACO AND INTRAOCULAR LENS PLACEMENT (IOC) LEFT DIABETIC 8.46 00:59.8;  Surgeon: Clair Crews, MD;  Location: [facility];  Service: Ophthalmology;  Laterality: Left;   CHOLECYSTECTOMY     COLONOSCOPY WITH PROPOFOL  N/A 03/11/2016   Procedure: COLONOSCOPY WITH PROPOFOL ;  Surgeon: Cassie Click, MD;  Location: [facility] ENDOSCOPY;  Service: Endoscopy;  Laterality: N/A;   COLONOSCOPY WITH PROPOFOL  N/A 02/18/2020   Procedure: COLONOSCOPY WITH PROPOFOL ;  Surgeon: Marnee Sink, MD;  Location: [facility] ENDOSCOPY;  Service: Endoscopy;  Laterality: N/A;   COLONOSCOPY WITH PROPOFOL  N/A 09/10/2022   Procedure: COLONOSCOPY WITH PROPOFOL ;  Surgeon: Luke Salaam, MD;  Location: [facility] ENDOSCOPY;  Service: Gastroenterology;  Laterality: N/A;   ESOPHAGEAL BANDING  09/10/2022   Procedure: ESOPHAGEAL BANDING;  Surgeon: Luke Salaam, MD;  Location: [facility] ENDOSCOPY;  Service: Gastroenterology;;   ESOPHAGEAL BANDING  10/17/2022   Procedure: ESOPHAGEAL BANDING;  Surgeon: Marnee Sink, MD;  Location: [facility] ENDOSCOPY;  Service: Endoscopy;;   ESOPHAGEAL BANDING  12/24/2022   Procedure: ESOPHAGEAL BANDING;   Surgeon: Marnee Sink, MD;  Location: [facility] ENDOSCOPY;  Service: Endoscopy;;   ESOPHAGEAL BANDING  05/13/2023   Procedure: ESOPHAGOSCOPY, WITH ESOPHAGEAL VARICES BAND LIGATION;  Surgeon: Marnee Sink, MD;  Location: [facility] ENDOSCOPY;  Service: Endoscopy;;   ESOPHAGOGASTRODUODENOSCOPY N/A 05/13/2023   Procedure: EGD (ESOPHAGOGASTRODUODENOSCOPY);  Surgeon: Marnee Sink, MD;  Location: [facility] ENDOSCOPY;  Service: Endoscopy;  Laterality: N/A;   ESOPHAGOGASTRODUODENOSCOPY (EGD) WITH PROPOFOL  N/A 03/11/2016   Procedure: ESOPHAGOGASTRODUODENOSCOPY (EGD) WITH PROPOFOL ;  Surgeon: Cassie Click, MD;  Location: [facility] ENDOSCOPY;  Service: Endoscopy;  Laterality: N/A;   ESOPHAGOGASTRODUODENOSCOPY (EGD) WITH PROPOFOL  N/A 02/18/2020   Procedure: ESOPHAGOGASTRODUODENOSCOPY (EGD) WITH PROPOFOL ;  Surgeon: Marnee Sink, MD;  Location: [facility] ENDOSCOPY;  Service: Endoscopy;  Laterality: N/A;   ESOPHAGOGASTRODUODENOSCOPY (EGD) WITH PROPOFOL  N/A 04/25/2020   Procedure: ESOPHAGOGASTRODUODENOSCOPY (EGD) WITH PROPOFOL ;  Surgeon: Marnee Sink, MD;  Location: [facility] ENDOSCOPY;  Service: Endoscopy;  Laterality: N/A;   ESOPHAGOGASTRODUODENOSCOPY (EGD) WITH PROPOFOL  N/A 05/23/2020   Procedure: ESOPHAGOGASTRODUODENOSCOPY (EGD) WITH PROPOFOL ;  Surgeon: Marnee Sink, MD;  Location: [facility] ENDOSCOPY;  Service: Endoscopy;  Laterality: N/A;   ESOPHAGOGASTRODUODENOSCOPY (EGD) WITH PROPOFOL  N/A 09/10/2022   Procedure: ESOPHAGOGASTRODUODENOSCOPY (EGD) WITH PROPOFOL ;  Surgeon: Luke Salaam, MD;  Location: [facility] ENDOSCOPY;  Service: Gastroenterology;  Laterality: N/A;   ESOPHAGOGASTRODUODENOSCOPY (EGD) WITH PROPOFOL  N/A 10/17/2022   Procedure: ESOPHAGOGASTRODUODENOSCOPY (EGD) WITH PROPOFOL ;  Surgeon: Marnee Sink, MD;  Location: [facility] ENDOSCOPY;  Service: Endoscopy;  Laterality: N/A;   ESOPHAGOGASTRODUODENOSCOPY (EGD) WITH PROPOFOL  N/A 12/24/2022   Procedure: ESOPHAGOGASTRODUODENOSCOPY (EGD) WITH PROPOFOL ;  Surgeon: Marnee Sink, MD;  Location: [facility]  ENDOSCOPY;  Service: Endoscopy;  Laterality: N/A;   ESOPHAGOGASTRODUODENOSCOPY (EGD) WITH PROPOFOL  N/A 01/29/2023   Procedure: ESOPHAGOGASTRODUODENOSCOPY (EGD) WITH PROPOFOL ;  Surgeon: Shane Darling, MD;  Location: [facility] ENDOSCOPY;  Service: Endoscopy;  Laterality: N/A;  Ideally we would intubate if possible given concern for varices   EUS N/A 10/04/2021   Procedure: LOWER ENDOSCOPIC ULTRASOUND (EUS);  Surgeon: Rayford Cake, MD;  Location: [facility] ENDOSCOPY;  Service: Gastroenterology;  Laterality: N/A;  [facility]   EYE SURGERY     FINGER ARTHROSCOPY WITH CARPOMETACARPEL ([facility]) ARTHROPLASTY Right 09/03/2018   Procedure: CARPOMETACARPEL [facility]) ARTHROPLASTY RIGHT THUMB;  Surgeon: Molli Angelucci, MD;  Location: [facility] ORS;  Service: Orthopedics;  Laterality: Right;   FLEXIBLE SIGMOIDOSCOPY N/A 09/14/2021   Procedure: FLEXIBLE SIGMOIDOSCOPY;  Surgeon: Marnee Sink, MD;  Location: [facility] ENDOSCOPY;  Service: Endoscopy;  Laterality: N/A;  No anesthesia   GANGLION CYST EXCISION Right 09/03/2018   Procedure: REMOVAL GANGLION OF WRIST;  Surgeon: Molli Angelucci, MD;  Location: [facility] ORS;  Service: Orthopedics;  Laterality: Right;   HARDWARE REMOVAL Right 09/03/2018   Procedure: HARDWARE REMOVAL RIGHT THUMB;  Surgeon: Molli Angelucci, MD;  Location: [facility] ORS;  Service: Orthopedics;  Laterality: Right;  staple removed   HEMOSTASIS CLIP PLACEMENT  09/10/2022  Procedure: HEMOSTASIS CLIP PLACEMENT;  Surgeon: Luke Salaam, MD;  Location: [facility] ENDOSCOPY;  Service: Gastroenterology;;   HEMOSTASIS CONTROL  09/10/2022   Procedure: HEMOSTASIS CONTROL;  Surgeon: Luke Salaam, MD;  Location: [facility] ENDOSCOPY;  Service: Gastroenterology;;   HEMOSTASIS CONTROL  01/29/2023   Procedure: HEMOSTASIS CONTROL;  Surgeon: Shane Darling, MD;  Location: [facility] ENDOSCOPY;  Service: Endoscopy;;   JOINT REPLACEMENT Left    TKR   left sinusplasty      MASTECTOMY, PARTIAL Left 10/17/2015   Procedure: MASTECTOMY PARTIAL REVISION;  Surgeon:  Benancio Bracket, MD;  Location: [facility] ORS;  Service: General;  Laterality: Left;   PACEMAKER IMPLANT N/A 03/23/2021   Procedure: PACEMAKER IMPLANT;  Surgeon: Boyce Byes, MD;  Location: [facility];  Service: Cardiovascular;  Laterality: N/A;   PARTIAL MASTECTOMY WITH NEEDLE LOCALIZATION Left 09/29/2015   Procedure: PARTIAL MASTECTOMY WITH NEEDLE LOCALIZATION;  Surgeon: Benancio Bracket, MD;  Location: [facility] ORS;  Service: General;  Laterality: Left;   POLYPECTOMY  09/10/2022   Procedure: POLYPECTOMY;  Surgeon: Luke Salaam, MD;  Location: [facility] ENDOSCOPY;  Service: Gastroenterology;;   RECTAL EXAM UNDER ANESTHESIA N/A 10/05/2021   Procedure: RECTAL EXAM UNDER ANESTHESIA, ASPIRATION OF RECTAL CYST;  Surgeon: Eldred Grego, MD;  Location: [facility] ORS;  Service: General;  Laterality: N/A;   TOTAL ABDOMINAL HYSTERECTOMY W/ BILATERAL SALPINGOOPHORECTOMY      Prior to Admission medications   Medication Sig Start Date End Date Taking? Authorizing Provider  albuterol  (VENTOLIN  HFA) 108 (90 Base) MCG/ACT inhaler Inhale 2 puffs into the lungs every 4 (four) hours as needed for wheezing or shortness of breath. 04/30/23 04/29/24  [provider]  Azelastine  HCl 137 MCG/SPRAY SOLN INHALE 1-2 PUFFS IN EACH NOSTRIL NASALLY ONCE A DAY 30 DAY(S)    [provider]  Cholecalciferol  (VITAMIN D ) 50 MCG (2000 UT) CAPS Take 2,000 Units by mouth daily.    [provider]  clobetasol  ointment (TEMOVATE ) 0.05 % Apply 1 application topically as needed (vaginal irritation).    [provider]  clotrimazole  (LOTRIMIN ) 1 % cream Apply 1 Application topically 2 (two) times daily as needed.    [provider]  cyanocobalamin (,VITAMIN B-12,) 1000 MCG/ML injection 1,000 mcg every 30 (thirty) days. 04/23/19   [provider]  dicyclomine  (BENTYL ) 10 MG capsule Take 10 mg by mouth in the morning, at noon, and at bedtime. 05/16/19   [provider]   docusate sodium  (COLACE) 100 MG capsule Take 100 mg by mouth 3 (three) times daily as needed for moderate constipation. 01/10/20   [provider]  esomeprazole (NEXIUM) 40 MG capsule Take 40 mg by mouth 2 (two) times daily before a meal.  02/08/16   [provider]  fenofibrate  160 MG tablet Take 160 mg by mouth at bedtime.    [provider]  gabapentin  (NEURONTIN ) 300 MG capsule Take 300 mg by mouth 2 (two) times daily. 09/07/16 10/25/36  Westley Hammers, MD  insulin  aspart (NOVOLOG ) 100 UNIT/ML injection Inject 38 Units into the skin 3 (three) times daily with meals.    [provider]  insulin  degludec (TRESIBA  FLEXTOUCH) 200 UNIT/ML FlexTouch Pen 200 Units at bedtime.    [provider]  ipratropium (ATROVENT) 0.03 % nasal spray Place 1 spray into both nostrils 2 (two) times daily as needed for rhinitis. 04/15/23 04/14/24  [provider]  levocetirizine (XYZAL) 5 MG tablet Take 5 mg by mouth every evening.    [provider]  levothyroxine  (SYNTHROID , LEVOTHROID) 75 MCG tablet Take 75 mcg by mouth daily before breakfast.     [provider]  magnesium  oxide (MAG-OX) 400 MG tablet Take 400 mg by mouth 2 (two) times daily.     [provider]  midodrine  (PROAMATINE ) 5 MG tablet Take 1 tablet (5 mg total) by mouth 3 (three) times daily with meals. 05/31/23   Luna Salinas, MD  MOUNJARO  15 MG/0.5ML Pen Inject 15 mg into the skin once a week. 03/18/23   [provider]  nadolol  (CORGARD ) 20 MG tablet Take 1 tablet (20 mg total) by mouth daily. 12/20/22   End, Veryl Gottron, MD  NONFORMULARY OR COMPOUNDED ITEM Nifedipine 0.3% plus lidocaine  2% cream apply to rectum BID and after every BM.    [provider]  ondansetron  (ZOFRAN ) 8 MG tablet One pill every 8 hours as needed for nausea/vomitting. 06/04/23   Brahmanday, Govinda R, MD  potassium chloride  SA (K-DUR,KLOR-CON ) 20 MEQ tablet Take 20 mEq by mouth 2 (two)  times daily.     [provider]  saccharomyces boulardii (FLORASTOR) 250 MG capsule Take 250 mg by mouth 2 (two) times daily.     [provider]  simvastatin  (ZOCOR ) 20 MG tablet Take 20 mg by mouth at bedtime.    [provider]  spironolactone  (ALDACTONE ) 25 MG tablet Take 1 tablet (25 mg total) by mouth daily. 05/22/23   Garrison Kanner, MD  torsemide  (DEMADEX ) 20 MG tablet Take 40 mg by mouth daily.    [provider]  traMADol  (ULTRAM ) 50 MG tablet Take 1 tablet (50 mg total) by mouth every 8 (eight) hours as needed. 06/04/23   Brahmanday, Govinda R, MD  valACYclovir  (VALTREX ) 500 MG tablet Take 500 mg by mouth 2 (two) times daily as needed. 02/10/23   [provider]    Family History  Problem Relation Age of Onset   Breast cancer Paternal Grandmother    Colon cancer Father    Diabetes Sister    Diabetes Brother    Heart disease Brother    Prostate cancer Brother    Colon cancer Maternal Uncle    Prostate cancer Brother    Bladder Cancer Brother    Leukemia Mother        all   Ovarian cancer Neg Hx    Kidney cancer Neg Hx      Social History   Tobacco Use   Smoking status: Never   Smokeless tobacco: Never  Vaping Use   Vaping status: Never Used  Substance Use Topics   Alcohol use: Never   Drug use: Never    Allergies as of 06/04/2023 - Review Complete 06/04/2023  Allergen Reaction Noted   Codeine Itching, Nausea And Vomiting, and Other (See Comments) 06/28/2014   Demeclocycline Rash 04/10/2012   Demerol [meperidine] Itching and Nausea And Vomiting 06/28/2014   Hydrocodone Itching and Nausea And Vomiting 08/24/2018   Morphine  Nausea Only 03/03/2023   Other Other (See Comments) 04/10/2012   Oxycodone  Itching and Nausea And Vomiting 09/26/2015   Pentazocine Itching, Nausea And Vomiting, and Other (See Comments) 04/10/2012   Tetracyclines & related Rash 06/28/2014   Coal tar extract Other (See Comments) 03/19/2022    Hydrocodone-acetaminophen  Other (See Comments) 01/30/2018   Salicylic acid Other (See Comments) 04/02/2022   Tetracycline hcl Other (See Comments) 04/02/2022    Review of Systems:    All systems reviewed and negative except where noted in HPI.   Physical Exam:  Vital signs in  last 24 hours: Temp:  [97.9 F (36.6 C)-98.2 F (36.8 C)] 98.2 F (36.8 C) (04/24 1507) Pulse Rate:  [63-81] 63 (04/24 1507) Resp:  [15-20] 18 (04/24 1507) BP: (93-116)/(42-57) 110/42 (04/24 1507) SpO2:  [94 %-100 %] 94 % (04/24 0746) Weight:  [68.5 kg] 68.5 kg (04/23 1726) Last BM Date : 06/04/23 General:   Pleasant, cooperative in NAD Head:  Normocephalic and atraumatic. Eyes:   No icterus.   Conjunctiva pink. PERRLA. Ears:  Normal auditory acuity. Neck:  Supple; no masses or thyroidomegaly Lungs: Respirations even and unlabored. Lungs clear to auscultation bilaterally.   No wheezes, crackles, or rhonchi.  Heart:  Regular rate and rhythm;  Without murmur, clicks, rubs or gallops Abdomen:  Soft, positive distention with shifting dullness and fluid wave, nontender. Normal bowel sounds. No appreciable masses or hepatomegaly.  No rebound or guarding.  Rectal:  Not performed. Msk:  Symmetrical without gross deformities.   Extremities:  Without edema, cyanosis or clubbing. Neurologic:  Alert and oriented x3;  grossly normal neurologically. Skin:  Intact without significant lesions or rashes. Cervical Nodes:  No significant cervical adenopathy. Psych:  Alert and cooperative. Normal affect.  LAB RESULTS: Recent Labs    06/04/23 1250 06/04/23 1730 06/05/23 0407  WBC 2.1* 2.3* 1.5*  HGB 9.7* 10.3* 8.6*  HCT 30.1* 31.9* 25.4*  PLT 73* 81* 65*   BMET Recent Labs    06/04/23 1250 06/04/23 1730 06/05/23 0407  NA 136 139 140  K 3.2* 3.5 2.7*  CL 102 102 105  CO2 26 26 28   GLUCOSE 179* 132* 98  BUN 17 18 18   CREATININE 1.09* 1.05* 1.08*  CALCIUM 8.1* 8.5* 8.1*   LFT Recent Labs    06/05/23 0407   PROT 5.9*  ALBUMIN  2.7*  AST 30  ALT 18  ALKPHOS 39  BILITOT 0.7   PT/INR No results for input(s): "LABPROT", "INR" in the last 72 hours.  STUDIES: No results found.    Impression / Plan:   Assessment: Principal Problem:   Ascites   Annette Foster is a 83 y.o. y/o female with with refractory ascites and a MELD score of 10.  The patient has had esophageal variceal banding.  The patient has not had issues with hepatic encephalopathy in the past.  I have discussed the risks of TIPS procedure with the patient mainly being hepatic encephalopathy.  Plan:  I have sent a message to Dr. Marne Sings in interventional radiology to see if he would be able to see the patient for a TIPS procedure.  The patient's creatinine has trended up when diuretics have been increased in the past.  Thank you for involving me in the care of this patient.      LOS: 0 days   Marnee Sink, MD, MD. Sylvan Evener 06/05/2023, 3:10 PM,  Pager [phone] 7am-5pm  Check AMION for 5pm -7am coverage and on weekends   Note: This dictation was prepared with Dragon dictation along with smaller phrase technology. Any transcriptional errors that result from this process are unintentional.

## 2023-06-05 NOTE — Assessment & Plan Note (Addendum)
 Patient with pancytopenia, mild coagulopathy with an INR of 1.6, refractory ascites, history of esophageal varices.  Continue nadolol .  TIPS procedure 4/28.  Patient also started on empiric lactulose  after TIPS procedure.

## 2023-06-06 DIAGNOSIS — E038 Other specified hypothyroidism: Secondary | ICD-10-CM

## 2023-06-06 DIAGNOSIS — K7581 Nonalcoholic steatohepatitis (NASH): Secondary | ICD-10-CM | POA: Diagnosis not present

## 2023-06-06 DIAGNOSIS — D61818 Other pancytopenia: Secondary | ICD-10-CM | POA: Diagnosis not present

## 2023-06-06 DIAGNOSIS — E876 Hypokalemia: Secondary | ICD-10-CM | POA: Diagnosis not present

## 2023-06-06 DIAGNOSIS — R188 Other ascites: Secondary | ICD-10-CM | POA: Diagnosis not present

## 2023-06-06 LAB — BASIC METABOLIC PANEL WITH GFR
Anion gap: 6 (ref 5–15)
BUN: 16 mg/dL (ref 8–23)
CO2: 24 mmol/L (ref 22–32)
Calcium: 8.5 mg/dL — ABNORMAL LOW (ref 8.9–10.3)
Chloride: 106 mmol/L (ref 98–111)
Creatinine, Ser: 0.7 mg/dL (ref 0.44–1.00)
GFR, Estimated: >60 mL/min (ref >60)
Glucose, Bld: 85 mg/dL (ref 70–99)
Potassium: 3.1 mmol/L — ABNORMAL LOW (ref 3.5–5.1)
Sodium: 136 mmol/L (ref 135–145)

## 2023-06-06 LAB — TYPE AND SCREEN
ABO/RH(D): A POS
Antibody Screen: NEGATIVE

## 2023-06-06 LAB — CBC
HCT: 25.7 % — ABNORMAL LOW (ref 36.0–46.0)
Hemoglobin: 8.5 g/dL — ABNORMAL LOW (ref 12.0–15.0)
MCH: 31.7 pg (ref 26.0–34.0)
MCHC: 33.1 g/dL (ref 30.0–36.0)
MCV: 95.9 fL (ref 80.0–100.0)
Platelets: 51 10*3/uL — ABNORMAL LOW (ref 150–400)
RBC: 2.68 MIL/uL — ABNORMAL LOW (ref 3.87–5.11)
RDW: 14.7 % (ref 11.5–15.5)
WBC: 1.2 10*3/uL — CL (ref 4.0–10.5)
nRBC: 0 % (ref 0.0–0.2)

## 2023-06-06 LAB — GLUCOSE, CAPILLARY
Glucose-Capillary: 81 mg/dL (ref 70–99)
Glucose-Capillary: 181 mg/dL — ABNORMAL HIGH (ref 70–99)
Glucose-Capillary: 96 mg/dL (ref 70–99)
Glucose-Capillary: 115 mg/dL — ABNORMAL HIGH (ref 70–99)

## 2023-06-06 LAB — MAGNESIUM: Magnesium: 2.2 mg/dL (ref 1.7–2.4)

## 2023-06-06 MED ORDER — POTASSIUM CHLORIDE CRYS ER 20 MEQ PO TBCR
40.0000 meq | EXTENDED_RELEASE_TABLET | Freq: Two times a day (BID) | ORAL | Status: DC
  Administered 2023-06-06 (×2): 40 meq via ORAL
  Filled 2023-06-06 (×2): qty 2

## 2023-06-06 MED ORDER — HYDROXYZINE HCL 10 MG PO TABS
10.0000 mg | ORAL_TABLET | Freq: Three times a day (TID) | ORAL | Status: DC | PRN
  Administered 2023-06-06 – 2023-06-16 (×8): 10 mg via ORAL
  Filled 2023-06-06 (×9): qty 1

## 2023-06-06 NOTE — Consult Note (Signed)
 Chief Complaint: Refractory ascites; request for image guided transjugular intrahepatic portosystemic shunt creation  Referring Provider(s): Dr. Marnee Sink  Supervising Physician: Myrlene Asper  Patient Status: [facility] - In-pt  History of Present Illness: Annette Foster is a 83 y.o. female with a history of DM, L BCA, MASH, CHF, sleep apnea (uses CPAP), hypothyroidism, AV junction ablation 2024, pacemaker.  EGD on 05/13/2023 with findings of grade 2 esophageal varices (banded at that time), portal hypertensive gastropathy and erythematous mucosa in the stomach. She has undergone 3 paracenteses in the past month (4/10 4.8L, 4/19 4.2L, 4/24 4.1L).  She was seen by Dr. Ole Berkeley with GI while IP on 06/05/23 who notes MELD 10. He spoke to Dr. Marne Sings who approves patient for TIPS procedure Monday 06/09/23.  She has orders for NPO MN 4/28, labs 4/28 AM (CBC, CMP, ammonia, INR).  Not on a blood thinner.  Lying in bed, A&Ox4, with daughter at bedside at time of exam and interview/consent.    Patient is Full Code  Past Medical History:  Diagnosis Date   Abdominal pain    Allergy    Cancer (HCC) 2017   breast cancer- Left   Cataract    CHF (congestive heart failure) (HCC)    Collagenous colitis    Coronary artery disease    Diabetes mellitus without complication (HCC)    Diarrhea    Diverticulosis    Dysrhythmia    Fatty liver disease, nonalcoholic    Fibrocystic breast    GERD (gastroesophageal reflux disease)    Heart murmur    Hyperlipidemia    Hypertension    Hypothyroidism    IBS (irritable bowel syndrome)    IDA (iron  deficiency anemia)    Liver cirrhosis (HCC)    Personal history of radiation therapy 2017   LEFT BREAST CA   PONV (postoperative nausea and vomiting)    Presence of permanent cardiac pacemaker    Sleep apnea    C-Pap    Past Surgical History:  Procedure Laterality Date   ABDOMINAL HYSTERECTOMY     tah bso   ABDOMINAL SURGERY     APPENDECTOMY      AV NODE ABLATION N/A 05/17/2021   Procedure: AV NODE ABLATION;  Surgeon: Boyce Byes, MD;  [facility];  Service: Cardiovascular;  Laterality: N/A;   BREAST BIOPSY Bilateral 2016   negative   BREAST 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/2015   DCIS, papillary carcinoma in situ   BREAST BIOPSY Left 05/27/2016   BENIGN MAMMARY EPITHELIUM   BREAST BIOPSY Left 11/20/2017   affirm bx x clip BENIGN MAMMARY EPITHELIUM CONSISTENT WITH RAD THERAPY   BREAST EXCISIONAL BIOPSY Right    NEG 1980's   BREAST LUMPECTOMY Left 10/17/2015   DCIS and papillary carcinoma insitu, clear margins   CARDIAC SURGERY     has replacement valve   CATARACT EXTRACTION Right    CATARACT EXTRACTION W/PHACO Left 01/16/2021   Procedure: CATARACT EXTRACTION PHACO AND INTRAOCULAR LENS PLACEMENT (IOC) LEFT DIABETIC 8.46 00:59.8;  Surgeon: Clair Crews, MD;  Location: [facility];  Service: Ophthalmology;  Laterality: Left;   CHOLECYSTECTOMY     COLONOSCOPY WITH PROPOFOL  N/A 03/11/2016   Procedure: COLONOSCOPY WITH PROPOFOL ;  Surgeon: Cassie Click, MD;  Location: [facility] ENDOSCOPY;  Service: Endoscopy;  Laterality: N/A;   COLONOSCOPY WITH PROPOFOL  N/A 02/18/2020   Procedure: COLONOSCOPY WITH PROPOFOL ;  Surgeon: Marnee Sink, MD;  Location: [facility] ENDOSCOPY;  Service: Endoscopy;  Laterality: N/A;   COLONOSCOPY WITH  PROPOFOL  N/A 09/10/2022   Procedure: COLONOSCOPY WITH PROPOFOL ;  Surgeon: Luke Salaam, MD;  Location: [facility] ENDOSCOPY;  Service: Gastroenterology;  Laterality: N/A;   ESOPHAGEAL BANDING  09/10/2022   Procedure: ESOPHAGEAL BANDING;  Surgeon: Luke Salaam, MD;  Location: [facility] ENDOSCOPY;  Service: Gastroenterology;;   ESOPHAGEAL BANDING  10/17/2022   Procedure: ESOPHAGEAL BANDING;  Surgeon: Marnee Sink, MD;  Location: [facility] ENDOSCOPY;  Service: Endoscopy;;   ESOPHAGEAL BANDING  12/24/2022   Procedure: ESOPHAGEAL BANDING;  Surgeon: Marnee Sink, MD;  Location: [facility] ENDOSCOPY;  Service: Endoscopy;;    ESOPHAGEAL BANDING  05/13/2023   Procedure: ESOPHAGOSCOPY, WITH ESOPHAGEAL VARICES BAND LIGATION;  Surgeon: Marnee Sink, MD;  Location: [facility] ENDOSCOPY;  Service: Endoscopy;;   ESOPHAGOGASTRODUODENOSCOPY N/A 05/13/2023   Procedure: EGD (ESOPHAGOGASTRODUODENOSCOPY);  Surgeon: Marnee Sink, MD;  Location: [facility] ENDOSCOPY;  Service: Endoscopy;  Laterality: N/A;   ESOPHAGOGASTRODUODENOSCOPY (EGD) WITH PROPOFOL  N/A 03/11/2016   Procedure: ESOPHAGOGASTRODUODENOSCOPY (EGD) WITH PROPOFOL ;  Surgeon: Cassie Click, MD;  Location: [facility] ENDOSCOPY;  Service: Endoscopy;  Laterality: N/A;   ESOPHAGOGASTRODUODENOSCOPY (EGD) WITH PROPOFOL  N/A 02/18/2020   Procedure: ESOPHAGOGASTRODUODENOSCOPY (EGD) WITH PROPOFOL ;  Surgeon: Marnee Sink, MD;  Location: [facility] ENDOSCOPY;  Service: Endoscopy;  Laterality: N/A;   ESOPHAGOGASTRODUODENOSCOPY (EGD) WITH PROPOFOL  N/A 04/25/2020   Procedure: ESOPHAGOGASTRODUODENOSCOPY (EGD) WITH PROPOFOL ;  Surgeon: Marnee Sink, MD;  Location: [facility] ENDOSCOPY;  Service: Endoscopy;  Laterality: N/A;   ESOPHAGOGASTRODUODENOSCOPY (EGD) WITH PROPOFOL  N/A 05/23/2020   Procedure: ESOPHAGOGASTRODUODENOSCOPY (EGD) WITH PROPOFOL ;  Surgeon: Marnee Sink, MD;  Location: [facility] ENDOSCOPY;  Service: Endoscopy;  Laterality: N/A;   ESOPHAGOGASTRODUODENOSCOPY (EGD) WITH PROPOFOL  N/A 09/10/2022   Procedure: ESOPHAGOGASTRODUODENOSCOPY (EGD) WITH PROPOFOL ;  Surgeon: Luke Salaam, MD;  Location: [facility] ENDOSCOPY;  Service: Gastroenterology;  Laterality: N/A;   ESOPHAGOGASTRODUODENOSCOPY (EGD) WITH PROPOFOL  N/A 10/17/2022   Procedure: ESOPHAGOGASTRODUODENOSCOPY (EGD) WITH PROPOFOL ;  Surgeon: Marnee Sink, MD;  Location: [facility] ENDOSCOPY;  Service: Endoscopy;  Laterality: N/A;   ESOPHAGOGASTRODUODENOSCOPY (EGD) WITH PROPOFOL  N/A 12/24/2022   Procedure: ESOPHAGOGASTRODUODENOSCOPY (EGD) WITH PROPOFOL ;  Surgeon: Marnee Sink, MD;  Location: [facility] ENDOSCOPY;  Service: Endoscopy;  Laterality: N/A;   ESOPHAGOGASTRODUODENOSCOPY (EGD)  WITH PROPOFOL  N/A 01/29/2023   Procedure: ESOPHAGOGASTRODUODENOSCOPY (EGD) WITH PROPOFOL ;  Surgeon: Shane Darling, MD;  Location: [facility] ENDOSCOPY;  Service: Endoscopy;  Laterality: N/A;  Ideally we would intubate if possible given concern for varices   EUS N/A 10/04/2021   Procedure: LOWER ENDOSCOPIC ULTRASOUND (EUS);  Surgeon: Rayford Cake, MD;  Location: [facility] ENDOSCOPY;  Service: Gastroenterology;  Laterality: N/A;  [facility]   EYE SURGERY     FINGER ARTHROSCOPY WITH CARPOMETACARPEL ([facility]) ARTHROPLASTY Right 09/03/2018   Procedure: CARPOMETACARPEL [facility]) ARTHROPLASTY RIGHT THUMB;  Surgeon: Molli Angelucci, MD;  Location: [facility] ORS;  Service: Orthopedics;  Laterality: Right;   FLEXIBLE SIGMOIDOSCOPY N/A 09/14/2021   Procedure: FLEXIBLE SIGMOIDOSCOPY;  Surgeon: Marnee Sink, MD;  Location: [facility] ENDOSCOPY;  Service: Endoscopy;  Laterality: N/A;  No anesthesia   GANGLION CYST EXCISION Right 09/03/2018   Procedure: REMOVAL GANGLION OF WRIST;  Surgeon: Molli Angelucci, MD;  Location: [facility] ORS;  Service: Orthopedics;  Laterality: Right;   HARDWARE REMOVAL Right 09/03/2018   Procedure: HARDWARE REMOVAL RIGHT THUMB;  Surgeon: Molli Angelucci, MD;  Location: [facility] ORS;  Service: Orthopedics;  Laterality: Right;  staple removed   HEMOSTASIS CLIP PLACEMENT  09/10/2022   Procedure: HEMOSTASIS CLIP PLACEMENT;  Surgeon: Luke Salaam, MD;  Location: [facility] ENDOSCOPY;  Service: Gastroenterology;;   HEMOSTASIS CONTROL  09/10/2022   Procedure: HEMOSTASIS CONTROL;  Surgeon: Luke Salaam, MD;  Location: [facility] ENDOSCOPY;  Service: Gastroenterology;;   HEMOSTASIS CONTROL  01/29/2023   Procedure: HEMOSTASIS CONTROL;  Surgeon: Shane Darling, MD;  Location: [facility] ENDOSCOPY;  Service: Endoscopy;;   JOINT REPLACEMENT Left    TKR   left sinusplasty      MASTECTOMY, PARTIAL Left 10/17/2015   Procedure: MASTECTOMY PARTIAL REVISION;  Surgeon: Benancio Bracket, MD;  Location: [facility] ORS;  Service: General;  Laterality: Left;    PACEMAKER IMPLANT N/A 03/23/2021   Procedure: PACEMAKER IMPLANT;  Surgeon: Boyce Byes, MD;  [facility];  Service: Cardiovascular;  Laterality: N/A;   PARTIAL MASTECTOMY WITH NEEDLE LOCALIZATION Left 09/29/2015   Procedure: PARTIAL MASTECTOMY WITH NEEDLE LOCALIZATION;  Surgeon: Benancio Bracket, MD;  Location: [facility] ORS;  Service: General;  Laterality: Left;   POLYPECTOMY  09/10/2022   Procedure: POLYPECTOMY;  Surgeon: Luke Salaam, MD;  Location: [facility] ENDOSCOPY;  Service: Gastroenterology;;   RECTAL EXAM UNDER ANESTHESIA N/A 10/05/2021   Procedure: RECTAL EXAM UNDER ANESTHESIA, ASPIRATION OF RECTAL CYST;  Surgeon: Eldred Grego, MD;  Location: [facility] ORS;  Service: General;  Laterality: N/A;   TOTAL ABDOMINAL HYSTERECTOMY W/ BILATERAL SALPINGOOPHORECTOMY      Allergies: Codeine, Demeclocycline, Demerol [meperidine], Hydrocodone, Morphine , Other, Oxycodone , Pentazocine, Tetracyclines & related, Coal tar extract, Hydrocodone-acetaminophen , Salicylic acid, and Tetracycline hcl  Medications: Prior to Admission medications   Medication Sig Start Date End Date Taking? Authorizing Provider  albuterol  (VENTOLIN  HFA) 108 (90 Base) MCG/ACT inhaler Inhale 2 puffs into the lungs every 4 (four) hours as needed for wheezing or shortness of breath. 04/30/23 04/29/24  [provider]  Azelastine  HCl 137 MCG/SPRAY SOLN INHALE 1-2 PUFFS IN EACH NOSTRIL NASALLY ONCE A DAY 30 DAY(S)    [provider]  Cholecalciferol  (VITAMIN D ) 50 MCG (2000 UT) CAPS Take 2,000 Units by mouth daily.    [provider]  clobetasol  ointment (TEMOVATE ) 0.05 % Apply 1 application topically as needed (vaginal irritation).    [provider]  clotrimazole  (LOTRIMIN ) 1 % cream Apply 1 Application topically 2 (two) times daily as needed.    [provider]  cyanocobalamin (,VITAMIN B-12,) 1000 MCG/ML injection 1,000 mcg every 30 (thirty) days. 04/23/19    [provider]  dicyclomine  (BENTYL ) 10 MG capsule Take 10 mg by mouth in the morning, at noon, and at bedtime. 05/16/19   [provider]  docusate sodium  (COLACE) 100 MG capsule Take 100 mg by mouth 3 (three) times daily as needed for moderate constipation. 01/10/20   [provider]  esomeprazole (NEXIUM) 40 MG capsule Take 40 mg by mouth 2 (two) times daily before a meal.  02/08/16   [provider]  fenofibrate  160 MG tablet Take 160 mg by mouth at bedtime.    [provider]  gabapentin  (NEURONTIN ) 300 MG capsule Take 300 mg by mouth 2 (two) times daily. 09/07/16 10/25/36  Westley Hammers, MD  insulin  aspart (NOVOLOG ) 100 UNIT/ML injection Inject 38 Units into the skin 3 (three) times daily with meals.    [provider]  insulin  degludec (TRESIBA  FLEXTOUCH) 200 UNIT/ML FlexTouch Pen 200 Units at bedtime.    [provider]  ipratropium (ATROVENT) 0.03 % nasal spray Place 1 spray into both nostrils 2 (two) times daily as needed for rhinitis. 04/15/23 04/14/24  [provider]  levocetirizine (XYZAL) 5 MG tablet Take 5 mg by mouth every evening.    [provider]  levothyroxine  (SYNTHROID , LEVOTHROID) 75 MCG tablet Take 75  mcg by mouth daily before breakfast.     [provider]  magnesium  oxide (MAG-OX) 400 MG tablet Take 400 mg by mouth 2 (two) times daily.     [provider]  midodrine  (PROAMATINE ) 5 MG tablet Take 1 tablet (5 mg total) by mouth 3 (three) times daily with meals. 05/31/23   Amin, Sumayya, MD  MOUNJARO  15 MG/0.5ML Pen Inject 15 mg into the skin once a week. 03/18/23   [provider]  nadolol  (CORGARD ) 20 MG tablet Take 1 tablet (20 mg total) by mouth daily. 12/20/22   End, Veryl Gottron, MD  NONFORMULARY OR COMPOUNDED ITEM Nifedipine 0.3% plus lidocaine  2% cream apply to rectum BID and after every BM.    [provider]  ondansetron  (ZOFRAN ) 8 MG tablet One pill every 8  hours as needed for nausea/vomitting. 06/04/23   Brahmanday, Govinda R, MD  potassium chloride  SA (K-DUR,KLOR-CON ) 20 MEQ tablet Take 20 mEq by mouth 2 (two) times daily.     [provider]  saccharomyces boulardii (FLORASTOR) 250 MG capsule Take 250 mg by mouth 2 (two) times daily.     [provider]  simvastatin  (ZOCOR ) 20 MG tablet Take 20 mg by mouth at bedtime.    [provider]  spironolactone  (ALDACTONE ) 25 MG tablet Take 1 tablet (25 mg total) by mouth daily. 05/22/23   Garrison Kanner, MD  torsemide  (DEMADEX ) 20 MG tablet Take 40 mg by mouth daily.    [provider]  traMADol  (ULTRAM ) 50 MG tablet Take 1 tablet (50 mg total) by mouth every 8 (eight) hours as needed. 06/04/23   Brahmanday, Govinda R, MD  valACYclovir  (VALTREX ) 500 MG tablet Take 500 mg by mouth 2 (two) times daily as needed. 02/10/23   [provider]     Family History  Problem Relation Age of Onset   Breast cancer Paternal Grandmother    Colon cancer Father    Diabetes Sister    Diabetes Brother    Heart disease Brother    Prostate cancer Brother    Colon cancer Maternal Uncle    Prostate cancer Brother    Bladder Cancer Brother    Leukemia Mother        all   Ovarian cancer Neg Hx    Kidney cancer Neg Hx     Social History   Socioeconomic History   Marital status: Married    Spouse name: Richardean Chancellor   Number of children: Not on file   Years of education: Not on file   Highest education level: Not on file  Occupational History   Not on file  Tobacco Use   Smoking status: Never   Smokeless tobacco: Never  Vaping Use   Vaping status: Never Used  Substance and Sexual Activity   Alcohol use: Never   Drug use: Never   Sexual activity: Not Currently    Birth control/protection: Surgical  Other Topics Concern   Not on file  Social History Narrative    Husband lives at nursing home , lives with husbands sister   Social Drivers of Health   Financial Resource  Strain: Medium Risk (05/02/2023)   Received from [facility]   Overall Financial Resource Strain (CARDIA)    Difficulty of Paying Living Expenses: Somewhat hard  Food Insecurity: No Food Insecurity (06/06/2023)   Hunger Vital Sign    Worried About Running Out of Food in the Last Year: Never true    Ran Out of Food in  the Last Year: Never true  Transportation Needs: No Transportation Needs (06/06/2023)   PRAPARE - Administrator, Civil Service (Medical): No    Lack of Transportation (Non-Medical): No  Physical Activity: Unknown (01/30/2018)   [facility]   Exercise Vital Sign    Days of Exercise per Week: 7 days    Minutes of Exercise per Session: Not on file  Stress: Not on file  Social Connections: Moderately Integrated (06/06/2023)   Social Connection and Isolation Panel [NHANES]    Frequency of Communication with Friends and Family: More than three times a week    Frequency of Social Gatherings with Friends and Family: More than three times a week    Attends Religious Services: More than 4 times per year    Active Member of [employer] or Organizations: No    Attends Banker Meetings: Never    Marital Status: Married     Review of Systems: A 12 point ROS discussed and pertinent positives are indicated in the HPI above.  All other systems are negative.  Review of Systems  Constitutional:  Negative for chills and fever.  Cardiovascular:  Negative for chest pain.  Gastrointestinal:  Positive for abdominal distention and abdominal pain. Negative for nausea and vomiting.       Intermittent abd pain, not currently present  Genitourinary:  Negative for hematuria.  Skin:        Pruritus   Hematological:  Does not bruise/bleed easily.    Vital Signs: BP (!) 107/50 (BP Location: Left Arm)   Pulse 65   Temp 98.6 F (37 C) (Oral)   Resp 17   Ht 5\' 3"  (1.6 m)   Wt 151 lb (68.5 kg)   LMP  (LMP Unknown)   SpO2  97%   BMI 26.75 kg/m   Advance Care Plan: No documents on file    Physical Exam HENT:     Mouth/Throat:     Mouth: Mucous membranes are moist.     Pharynx: Oropharynx is clear.  Cardiovascular:     Rate and Rhythm: Normal rate.     Pulses: Normal pulses.     Heart sounds: Murmur heard.  Pulmonary:     Effort: Pulmonary effort is normal.     Breath sounds: Normal breath sounds.  Abdominal:     General: There is distension.     Palpations: Abdomen is soft.     Tenderness: There is abdominal tenderness.     Comments: Epigastric tenderness  Skin:    General: Skin is warm and dry.     Comments: No rash or wounds at potential puncture locations for procedure (neck, RUQ, groin)  Neurological:     Mental Status: She is alert and oriented to person, place, and time.  Psychiatric:        Mood and Affect: Mood normal.        Behavior: Behavior normal.     Imaging: US  Paracentesis Result Date: 06/05/2023 INDICATION: Patient with history of NASH cirrhosis, recurrent ascites. Request to IR for paracentesis. EXAM: ULTRASOUND GUIDED THERAPEUTIC PARACENTESIS MEDICATIONS: 6 mL 1% lidocaine  COMPLICATIONS: None immediate. PROCEDURE: Informed written consent was obtained from the patient after a discussion of the risks, benefits and alternatives to treatment. A timeout was performed prior to the initiation of the procedure. Initial ultrasound scanning demonstrates a large amount of ascites within the right lower abdominal quadrant. The right lower abdomen was prepped and draped in the usual  sterile fashion. 1% lidocaine  was used for local anesthesia. Following this, a 5 Fr, 7-cm, Merit One Step centesis catheter was introduced. An ultrasound image was saved for documentation purposes. The paracentesis was performed. The catheter was removed and a dressing was applied. The patient tolerated the procedure well without immediate post procedural complication. FINDINGS: A total of approximately 4.1 L of  clear, amber fluid was removed. IMPRESSION: Successful ultrasound-guided paracentesis yielding 4.1 liters of peritoneal fluid. Performed by Nathan Bake, PA-C PLAN: The patient has required >/=2 paracenteses in a 30 day period and a formal evaluation by the [facility] Interventional Radiology Portal Hypertension [facility] has been arranged. Electronically Signed   By: Elene Griffes M.D.   On: 06/05/2023 15:41   US  Paracentesis Result Date: 06/01/2023 INDICATION: Recurrent ascites.  History of NASH cirrhosis. EXAM: ULTRASOUND GUIDED THERAPEUTIC PARACENTESIS MEDICATIONS: None. COMPLICATIONS: None immediate. PROCEDURE: Informed written consent was obtained from the patient after a discussion of the risks, benefits and alternatives to treatment. A timeout was performed prior to the initiation of the procedure. Initial ultrasound scanning demonstrates a large amount of ascites within the right lower abdominal quadrant. The right lower abdomen was prepped and draped in the usual sterile fashion. 1% lidocaine  was used for local anesthesia. Following this, a 6 Fr Safe-T-Centesis catheter was introduced. An ultrasound image was saved for documentation purposes. The paracentesis was performed. The catheter was removed and a dressing was applied. The patient tolerated the procedure well without immediate post procedural complication. Patient received post-procedure intravenous albumin ; see nursing notes for details. FINDINGS: A total of approximately 4200 mL of straw-colored ascitic fluid was removed. IMPRESSION: Successful ultrasound-guided paracentesis yielding 4.2 liters of peritoneal fluid. PLAN: If the patient eventually requires >/=2 paracenteses in a 30 day period, candidacy for formal evaluation by the [facility] Interventional Radiology Portal Hypertension Clinic will be assessed. Electronically Signed   By: Fernando Hoyer M.D.   On: 06/01/2023 11:56   DG Chest 2 View Result Date: 05/30/2023 CLINICAL DATA:   sob EXAM: CHEST - 2 VIEW COMPARISON:  04/01/2023. FINDINGS: Cardiac silhouette enlarged. No evidence of pneumothorax or pleural effusion. No evidence of pulmonary edema. Calcified aorta. Thoracic degenerative changes. Right-sided pacer. IMPRESSION: Enlarged cardiac silhouette. No acute cardiopulmonary process. Electronically Signed   By: Sydell Eva M.D.   On: 05/30/2023 20:27   US  Paracentesis Result Date: 05/22/2023 INDICATION: 83 year old female with nonalcoholic liver cirrhosis with new ascites. First encounter with IR for paracentesis today. EXAM: ULTRASOUND GUIDED THERAPEUTIC AND DIAGNOSTIC PARACENTESIS MEDICATIONS: 9 mL 1% lidocaine  COMPLICATIONS: None immediate. PROCEDURE: Informed written consent was obtained from the patient after a discussion of the risks, benefits and alternatives to treatment. A timeout was performed prior to the initiation of the procedure. Initial ultrasound scanning demonstrates a large amount of ascites within the right abdomen. The right lower abdomen was prepped and draped in the usual sterile fashion. 1% lidocaine  was used for local anesthesia. Following this, a 19 gauge, 7-cm, Yueh catheter was introduced. An ultrasound image was saved for documentation purposes. The paracentesis was performed. The catheter was removed and a dressing was applied. The patient tolerated the procedure well without immediate post procedural complication. Patient received post-procedure intravenous albumin ; see nursing notes for details. FINDINGS: A total of approximately4.8 liters of hazy amber fluid was removed. Samples were sent to the laboratory as requested by the clinical team. IMPRESSION: Successful ultrasound-guided paracentesis yielding 4.8 liters of peritoneal fluid. PLAN: If the patient eventually requires >/=2 paracenteses in a 30  day period, candidacy for formal evaluation by the [facility] Interventional Radiology Portal Hypertension Clinic will be assessed. Performed by  Terressa Fess, NP Electronically Signed   By: Myrlene Asper D.O.   On: 05/22/2023 14:16   CT ABDOMEN PELVIS W CONTRAST Result Date: 05/21/2023 CLINICAL DATA:  Abdominal pain.  Ascites.  Decreased hemoglobin. EXAM: CT ABDOMEN AND PELVIS WITH CONTRAST TECHNIQUE: Multidetector CT imaging of the abdomen and pelvis was performed using the standard protocol following bolus administration of intravenous contrast. RADIATION DOSE REDUCTION: This exam was performed according to the departmental dose-optimization program which includes automated exposure control, adjustment of the mA and/or kV according to patient size and/or use of iterative reconstruction technique. CONTRAST:  OMNIPAQUE  IOHEXOL  300 MG/ML  SOLN COMPARISON:  CT dated 09/11/2022. FINDINGS: Lower chest: Bibasilar atelectasis/scarring. Cardiac pacemaker noted. No intra-abdominal free air.  Moderate ascites. Hepatobiliary: Cirrhosis.  No biliary dilatation.  Cholecystectomy. Pancreas: Unremarkable. No pancreatic ductal dilatation or surrounding inflammatory changes. Spleen: Mild splenomegaly measuring 16 cm in length. Adrenals/Urinary Tract: The adrenal glands are unremarkable. There is no hydronephrosis on either side. There is symmetric enhancement and excretion of contrast by both kidneys. The urinary bladder is grossly unremarkable. Stomach/Bowel: Mild sigmoid diverticulosis. Diffuse thickened appearance of the colon likely related to ascites and hepatic colopathy. Colitis is less likely. Clinical correlation is recommended. There is a small hiatal hernia. There is no bowel obstruction. Appendectomy. Vascular/Lymphatic: Moderate aortoiliac atherosclerotic disease. The IVC is unremarkable. No portal venous gas. [place] off a geode varices. No adenopathy. Reproductive: Hysterectomy. Other: Mild subcutaneous edema. Musculoskeletal: Osteopenia with degenerative changes. No acute osseous pathology. IMPRESSION: 1. Cirrhosis with moderate ascites and mild  splenomegaly. 2. Mild sigmoid diverticulosis. No bowel obstruction. Electronically Signed   By: Angus Bark M.D.   On: 05/21/2023 18:42   CUP PACEART INCLINIC DEVICE CHECK Result Date: 05/20/2023 Normal in-clinic dual chamber pacemaker check. Presenting Rhythm: APVP . Routine testing of thresholds, sensing, and impedance demonstrate stable parameters and no programming changes needed at this time. 20 episodes of AT with longest lasting 1 day and the next longest 10.5 hours. No OAC. Overall burden 5%. Estimated longevity 6.3 years . Pt enrolled in remote follow-up. Changes made this session and as follows. - RA output increased to 1.9V - Program count increased from 13 to 14 - Changes made per CL .Mitchel An, RN   Labs:  CBC: Recent Labs    06/04/23 1250 06/04/23 1730 06/05/23 0407 06/06/23 0432  WBC 2.1* 2.3* 1.5* 1.2*  HGB 9.7* 10.3* 8.6* 8.5*  HCT 30.1* 31.9* 25.4* 25.7*  PLT 73* 81* 65* 51*    COAGS: Recent Labs    01/29/23 0438 05/21/23 1555 05/30/23 1809  INR 1.5* 1.3* 1.3*  APTT 37* 33 32    BMP: Recent Labs    06/04/23 1250 06/04/23 1730 06/05/23 0407 06/06/23 0432  NA 136 139 140 136  K 3.2* 3.5 2.7* 3.1*  CL 102 102 105 106  CO2 26 26 28 24   GLUCOSE 179* 132* 98 85  BUN 17 18 18 16   CALCIUM 8.1* 8.5* 8.1* 8.5*  CREATININE 1.09* 1.05* 1.08* 0.70  GFRNONAA 51* 53* 51* >60    LIVER FUNCTION TESTS: Recent Labs    05/30/23 1809 06/04/23 1250 06/04/23 1730 06/05/23 0407  BILITOT 1.1 1.1 1.1 0.7  AST 41 34 37 30  ALT 26 22 21 18   ALKPHOS 53 52 52 39  PROT 6.6 6.6 7.1 5.9*  ALBUMIN  3.1* 3.2* 3.3* 2.7*  Assessment and Plan:  Request for image guided transhepatic intrahepatic portosystemic shunt procedure approved for 4/28 by Dr. Marne Sings -VSS, afebrile -4/28 labs pending (CBC, CMP, ammonia, INR). Type and screen from this hospitalization resulted. -creat improved during hospitalization to 0.7 today from 1.09 4/23 -4/9 CT: evidence of  cirrhosis and splenomegaly -h/o HF; 11/14/22 cardiac MR available: LVEF 60%, RVEF 50%, mild tricuspid regurgitation, mitral postinflammatory changes, severe L atrial dilation -not on a blood thinner -NPO at [facility] prior -no evidence of new infection/bleeding concern in ROS or PE  Risks and benefits of TIPS were discussed with the patient and/or the patient's family including, but not limited to, infection, bleeding, damage to adjacent structures, worsening hepatic and/or cardiac function, worsening and/or the development of altered mental status/encephalopathy, non-target embolization and death.   She is aware of the potential for long term need for lactulose  use, need for TIPS revision, need for paracentesis after TIPS.   This interventional procedure involves the use of X-rays and because of the nature of the planned procedure, it is possible that we will have prolonged use of X-ray fluoroscopy.  Potential radiation risks to you include (but are not limited to) the following: - A slightly elevated risk for cancer several years later in life. This risk is typically less than 0.5% percent. This risk is low in comparison to the normal incidence of human cancer, which is 33% for women and 50% for men according to the [employer]. - Radiation induced injury can include skin redness, resembling a rash, tissue breakdown / ulcers and hair loss (which can be temporary or permanent).   The likelihood of either of these occurring depends on the difficulty of the procedure and whether you are sensitive to radiation due to previous procedures, disease, or genetic conditions.   IF your procedure requires a prolonged use of radiation, you will be notified and given written instructions for further action.  It is your responsibility to monitor the irradiated area for the 2 weeks following the procedure and to notify your physician if you are concerned that you have suffered a radiation induced injury.     All of the patient's questions were answered, patient is agreeable to proceed.  Consent signed and in chart.    Thank you for allowing our service to participate in Annette Foster 's care.    Electronically Signed: Terressa Fess, NP   06/06/2023, 1:21 PM     I spent a total of 40 Minutes    in face to face in clinical consultation, greater than 50% of which was counseling/coordinating care for image guided transhepatic intrahepatic portosystemic shunt procedure   (A copy of this note was sent to the referring provider and the time of visit.)

## 2023-06-06 NOTE — Assessment & Plan Note (Addendum)
 Patient was given empiric course of Rocephin .  Patient had a CT scan showing constipation which was likely the cause of her abdominal pain.

## 2023-06-06 NOTE — Care Management Important Message (Signed)
 Important Message  Patient Details  Name: Annette Foster MRN: [id number] Date of Birth: 10/22/1940   Important Message Given:  Yes - Medicare IM     Anise Kerns 06/06/2023, 12:49 PM

## 2023-06-06 NOTE — Progress Notes (Signed)
 Progress Note   Patient: Annette Foster [id number]:[id number] DOB: 1941-02-02 DOA: 06/04/2023     1 DOS: the patient was seen and examined on 06/06/2023   Brief hospital course: 83 y.o. 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  She last underwent paracentesis twice in the past 2 weeks on 4/10 and 4/20 with removal of 4.8 And 4.2 L respectively but has had rapid reaccumulation of fluid in spite of compliance with medication.  Patient states that her oncologist referred her to GI for consideration of Pleurx drain but appointment is far away. ED course and data review: Soft BP at 114/55 with otherwise normal vitals Labs with baseline pancytopenia, WBC 2.3, hemoglobin 10.3 and platelets 81.  Creatinine 1.05 from baseline of of 0.82 Lipase and LFTs WNL EKG, personally viewed and interpreted showing dual paced rhythm with occasional PVCs at rate of 74 No imaging done Hospitalist consulted for admission.  4/24.  Patient frustrated that she has recurrent ascites and ends up needing to come back to the hospital.  Paracentesis today removed 4.1 L.  GI made referral to interventional radiology for TIPS procedure for Monday.  Patient had quite a bit of pain after paracentesis. 4/25.  Patient feeling better today.  Not having any abdominal pain that she had last night.  Still need to replace potassium.  Assessment and Plan: * Refractory ascites Paracentesis 4/24 removed 4.1 L of fluid.  Continue spironolactone  and Demadex .  TIPS procedure scheduled for Monday.  Hypokalemia Continue to replace potassium aggressively  Liver cirrhosis  secondary to NASH [facility]) Patient with pancytopenia, mild coagulopathy with an INR of 1.3, refractory ascites, history of esophageal varices.  Continue nadolol .  TIPS procedure Monday.  Pancytopenia (HCC) Secondary liver disease  Hypothyroidism, unspecified Continue levothyroxine   Chronic diastolic CHF (congestive heart failure) (HCC) Last EF 55%.  Torsemide  and nadolol   Hyperlipidemia, unspecified Continue Zocor   Hypotension On midodrine   Abdominal pain Post paracentesis abdominal pain resolved.  Type 2 diabetes mellitus with complication, with long-term current use of insulin  (HCC) Last hemoglobin A1c back in January at 5.6.  Sugars on the lower side we will cut back on long-acting insulin  to 5 units at daily instead of 20.  Continue sliding scale insulin         Subjective: Patient feeling better than yesterday when she had severe abdominal pain after paracentesis.  Took a while but the abdominal pain went away.  Admitted with ascites.  Interventional radiology plans on doing a TIPS procedure on Monday.  Patient still has abdominal distention despite 4 L removed yesterday.  Physical Exam: Vitals:   06/05/23 1507 06/05/23 2121 06/06/23 0338 06/06/23 1200  BP: (!) 110/42 (!) 121/51 (!) 113/48 (!) 107/50  Pulse: 63 63 70 65  Resp: 18 20 20 17   Temp: 98.2 F (36.8 C) 97.9 F (36.6 C) 98.3 F (36.8 C) 98.6 F (37 C)  TempSrc:    Oral  SpO2:  97% 93% 97%  Weight:      Height:       Physical Exam HENT:     Head: Normocephalic.  Eyes:     General: Lids are normal.     Conjunctiva/sclera: Conjunctivae normal.  Cardiovascular:     Rate  and Rhythm: Normal rate and regular rhythm.     Heart sounds: Normal heart sounds, S1 normal and S2 normal.  Pulmonary:     Breath sounds: Examination of the right-lower field reveals decreased breath sounds. Examination of the left-lower field reveals decreased breath sounds. Decreased breath sounds present. No wheezing, rhonchi or  rales.  Abdominal:     General: There is distension.     Palpations: Abdomen is soft.     Tenderness: There is no abdominal tenderness.  Musculoskeletal:     Right lower leg: No swelling.     Left lower leg: No swelling.  Skin:    General: Skin is warm.     Findings: No rash.  Neurological:     Mental Status: She is alert and oriented to person, place, and time.     Data Reviewed: Potassium 3.1, creatinine 0.7, magnesium  2.2, white blood cell count 1.2, hemoglobin 8.5, platelet count 51  Family Communication: Spoke with daughter yesterday.  Left message today.  Disposition: Status is: Inpatient Remains inpatient appropriate because: Replace potassium today.  Scheduled for TIPS procedure on Monday for refractory ascites.  Planned Discharge Destination: Home    Time spent: 28 minutes  Author: Verla Glaze, MD 06/06/2023 3:38 PM  For on call review [email or web address].

## 2023-06-07 DIAGNOSIS — K7581 Nonalcoholic steatohepatitis (NASH): Secondary | ICD-10-CM | POA: Diagnosis not present

## 2023-06-07 DIAGNOSIS — E876 Hypokalemia: Secondary | ICD-10-CM | POA: Diagnosis not present

## 2023-06-07 DIAGNOSIS — N179 Acute kidney failure, unspecified: Secondary | ICD-10-CM

## 2023-06-07 DIAGNOSIS — R188 Other ascites: Secondary | ICD-10-CM | POA: Diagnosis not present

## 2023-06-07 DIAGNOSIS — D61818 Other pancytopenia: Secondary | ICD-10-CM | POA: Diagnosis not present

## 2023-06-07 LAB — BASIC METABOLIC PANEL WITH GFR
Anion gap: 3 — ABNORMAL LOW (ref 5–15)
BUN: 17 mg/dL (ref 8–23)
CO2: 27 mmol/L (ref 22–32)
Calcium: 8.3 mg/dL — ABNORMAL LOW (ref 8.9–10.3)
Chloride: 104 mmol/L (ref 98–111)
Creatinine, Ser: 1.09 mg/dL — ABNORMAL HIGH (ref 0.44–1.00)
GFR, Estimated: 51 mL/min — ABNORMAL LOW (ref >60)
Glucose, Bld: 113 mg/dL — ABNORMAL HIGH (ref 70–99)
Potassium: 3.7 mmol/L (ref 3.5–5.1)
Sodium: 134 mmol/L — ABNORMAL LOW (ref 135–145)

## 2023-06-07 LAB — GLUCOSE, CAPILLARY
Glucose-Capillary: 98 mg/dL (ref 70–99)
Glucose-Capillary: 152 mg/dL — ABNORMAL HIGH (ref 70–99)
Glucose-Capillary: 107 mg/dL — ABNORMAL HIGH (ref 70–99)
Glucose-Capillary: 136 mg/dL — ABNORMAL HIGH (ref 70–99)

## 2023-06-07 MED ORDER — FENTANYL CITRATE PF 50 MCG/ML IJ SOSY
6.2500 ug | PREFILLED_SYRINGE | Freq: Once | INTRAMUSCULAR | Status: AC
  Administered 2023-06-07: 6.5 ug via INTRAVENOUS
  Filled 2023-06-07: qty 1

## 2023-06-07 NOTE — Plan of Care (Signed)
  Problem: Education: Goal: Ability to describe self-care measures that may prevent or decrease complications (Diabetes Survival Skills Education) will improve Outcome: Progressing Goal: Individualized Educational Video(s) Outcome: Progressing   Problem: Coping: Goal: Ability to adjust to condition or change in health will improve Outcome: Progressing   Problem: Fluid Volume: Goal: Ability to maintain a balanced intake and output will improve Outcome: Progressing   Problem: Health Behavior/Discharge Planning: Goal: Ability to identify and utilize available resources and services will improve Outcome: Progressing Goal: Ability to manage health-related needs will improve Outcome: Progressing   Problem: Metabolic: Goal: Ability to maintain appropriate glucose levels will improve Outcome: Progressing   Problem: Nutritional: Goal: Maintenance of adequate nutrition will improve Outcome: Progressing Goal: Progress toward achieving an optimal weight will improve Outcome: Progressing   Problem: Skin Integrity: Goal: Risk for impaired skin integrity will decrease Outcome: Progressing   Problem: Tissue Perfusion: Goal: Adequacy of tissue perfusion will improve Outcome: Progressing   Problem: Education: Goal: Knowledge of General Education information will improve Description: Including pain rating scale, medication(s)/side effects and non-pharmacologic comfort measures Outcome: Progressing   Problem: Clinical Measurements: Goal: Ability to maintain clinical measurements within normal limits will improve Outcome: Progressing Goal: Will remain free from infection Outcome: Progressing Goal: Diagnostic test results will improve Outcome: Progressing Goal: Respiratory complications will improve Outcome: Progressing Goal: Cardiovascular complication will be avoided Outcome: Progressing   Problem: Activity: Goal: Risk for activity intolerance will decrease Outcome: Progressing    Problem: Nutrition: Goal: Adequate nutrition will be maintained Outcome: Progressing

## 2023-06-07 NOTE — Assessment & Plan Note (Addendum)
 Cr was as low  as 0.69 on 5/7.  Creatinine has high as 1.09 on 4/26.

## 2023-06-07 NOTE — Plan of Care (Signed)

## 2023-06-07 NOTE — Progress Notes (Signed)
 Progress Note   Patient: Annette Foster VWU:[id number] DOB: 1940-07-07 DOA: 06/04/2023     2 DOS: the patient was seen and examined on 06/07/2023   Brief hospital course: 83 y.o. 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  She last underwent paracentesis twice in the past 2 weeks on 4/10 and 4/20 with removal of 4.8 And 4.2 L respectively but has had rapid reaccumulation of fluid in spite of compliance with medication.  Patient states that her oncologist referred her to GI for consideration of Pleurx drain but appointment is far away. ED course and data review: Soft BP at 114/55 with otherwise normal vitals Labs with baseline pancytopenia, WBC 2.3, hemoglobin 10.3 and platelets 81.  Creatinine 1.05 from baseline of of 0.82 Lipase and LFTs WNL EKG, personally viewed and interpreted showing dual paced rhythm with occasional PVCs at rate of 74 No imaging done Hospitalist consulted for admission.  4/24.  Patient frustrated that she has recurrent ascites and ends up needing to come back to the hospital.  Paracentesis today removed 4.1 L.  GI made referral to interventional radiology for TIPS procedure for Monday.  Patient had quite a bit of pain after paracentesis. 4/25.  Patient feeling better today.  Not having any abdominal pain that she had last night.  Still need to replace potassium. 4/26.  Patient's abdomen is still very distended.  Looking forward to trying a TIPS procedure to see if this can help out with her recurrent ascites.  Assessment and Plan: * Refractory ascites Paracentesis 4/24 removed 4.1 L of fluid.   Continue spironolactone  and Demadex .  TIPS procedure scheduled for Monday.  Hypokalemia Continue to replace potassium  Liver cirrhosis secondary to NASH [facility]) Patient with pancytopenia, mild coagulopathy with an INR of 1.3, refractory ascites, history of esophageal varices.  Continue nadolol .  TIPS procedure Monday.  Pancytopenia (HCC) Secondary liver disease.  May end up needing a platelet transfusion prior to procedure.  Hypothyroidism, unspecified Continue levothyroxine   Chronic diastolic CHF (congestive heart failure) (HCC) Last EF 55%.  Torsemide  and nadolol   Hyperlipidemia, unspecified Continue Zocor   Hypotension On midodrine   Abdominal pain Patient a little tender today with palpation secondary to distention.  Type 2 diabetes mellitus with complication, with long-term current use of insulin  (HCC) Last hemoglobin A1c back in January at 5.6.  Decreased long-acting insulin  to 5 units at daily instead of 20.  Continue sliding scale insulin   AKI (acute kidney injury) (HCC) Cr was as low  as 0.7 on 4/25.  Cr 1.09 today. Watch with duiresis.        Subjective: Patient's abdomen is more distended.  Little bit tender.  Admitted for refractory ascites.  Mention the possibility of needing a platelet transfusion prior to procedure.  Physical Exam: Vitals:   06/06/23 1200 06/06/23 1952 06/07/23 0400 06/07/23 0830  BP: (!) 107/50 110/69 100/61 (!) 102/56  Pulse: 65 64 70 79  Resp: 17 20 20 16   Temp: 98.6 F (37 C) 98.3 F (36.8 C) 97.9 F (36.6 C) 98.9 F (37.2 C)  TempSrc: Oral Oral Oral Oral  SpO2: 97% 100% 98% 100%  Weight:      Height:       Physical Exam HENT:  Head: Normocephalic.  Eyes:     General: Lids are normal.     Conjunctiva/sclera: Conjunctivae normal.  Cardiovascular:     Rate and Rhythm: Normal rate and regular rhythm.     Heart sounds: Normal heart sounds, S1 normal and S2 normal.  Pulmonary:     Breath sounds: Examination of the  right-lower field reveals decreased breath sounds. Examination of the left-lower field reveals decreased breath sounds. Decreased breath sounds present. No wheezing, rhonchi or rales.  Abdominal:     General: There is distension.     Palpations: Abdomen is soft.     Tenderness: There is generalized abdominal tenderness.  Musculoskeletal:     Right lower leg: No swelling.     Left lower leg: No swelling.  Skin:    General: Skin is warm.     Findings: No rash.  Neurological:     Mental Status: She is alert and oriented to person, place, and time.     Data Reviewed: Creatinine 1.09, sodium 134, potassium 3.7  Family Communication: Updated daughter on the phone  Disposition: Status is: Inpatient Remains inpatient appropriate because: Patient will have TIPS procedure on Monday.  Abdomen still very distended.  Likely will need another paracentesis  Planned Discharge Destination: Home    Time spent: 28 minutes  Author: Verla Glaze, MD 06/07/2023 2:19 PM  For on call review [email or web address].

## 2023-06-08 DIAGNOSIS — K7581 Nonalcoholic steatohepatitis (NASH): Secondary | ICD-10-CM | POA: Diagnosis not present

## 2023-06-08 DIAGNOSIS — R188 Other ascites: Secondary | ICD-10-CM | POA: Diagnosis not present

## 2023-06-08 DIAGNOSIS — E876 Hypokalemia: Secondary | ICD-10-CM | POA: Diagnosis not present

## 2023-06-08 DIAGNOSIS — D61818 Other pancytopenia: Secondary | ICD-10-CM | POA: Diagnosis not present

## 2023-06-08 LAB — CBC
HCT: 27.9 % — ABNORMAL LOW (ref 36.0–46.0)
Hemoglobin: 9.2 g/dL — ABNORMAL LOW (ref 12.0–15.0)
MCH: 31.8 pg (ref 26.0–34.0)
MCHC: 33 g/dL (ref 30.0–36.0)
MCV: 96.5 fL (ref 80.0–100.0)
Platelets: 58 10*3/uL — ABNORMAL LOW (ref 150–400)
RBC: 2.89 MIL/uL — ABNORMAL LOW (ref 3.87–5.11)
RDW: 14.5 % (ref 11.5–15.5)
WBC: 1.7 10*3/uL — ABNORMAL LOW (ref 4.0–10.5)
nRBC: 0 % (ref 0.0–0.2)

## 2023-06-08 LAB — BASIC METABOLIC PANEL WITH GFR
Anion gap: 3 — ABNORMAL LOW (ref 5–15)
BUN: 18 mg/dL (ref 8–23)
CO2: 29 mmol/L (ref 22–32)
Calcium: 8.6 mg/dL — ABNORMAL LOW (ref 8.9–10.3)
Chloride: 102 mmol/L (ref 98–111)
Creatinine, Ser: 1.03 mg/dL — ABNORMAL HIGH (ref 0.44–1.00)
GFR, Estimated: 54 mL/min — ABNORMAL LOW (ref >60)
Glucose, Bld: 160 mg/dL — ABNORMAL HIGH (ref 70–99)
Potassium: 3.6 mmol/L (ref 3.5–5.1)
Sodium: 134 mmol/L — ABNORMAL LOW (ref 135–145)

## 2023-06-08 LAB — GLUCOSE, CAPILLARY
Glucose-Capillary: 105 mg/dL — ABNORMAL HIGH (ref 70–99)
Glucose-Capillary: 131 mg/dL — ABNORMAL HIGH (ref 70–99)
Glucose-Capillary: 119 mg/dL — ABNORMAL HIGH (ref 70–99)

## 2023-06-08 MED ORDER — SODIUM CHLORIDE 0.9 % IV SOLN
12.5000 mg | Freq: Once | INTRAVENOUS | Status: AC
  Administered 2023-06-08: 12.5 mg via INTRAVENOUS
  Filled 2023-06-08: qty 12.5

## 2023-06-08 MED ORDER — SODIUM CHLORIDE 0.9 % IV SOLN
2.0000 g | INTRAVENOUS | Status: AC
  Administered 2023-06-09: 2 g via INTRAVENOUS
  Filled 2023-06-08: qty 20

## 2023-06-08 MED ORDER — SODIUM CHLORIDE 0.9 % IV SOLN: 2.0000 g | INTRAVENOUS | Status: DC

## 2023-06-08 NOTE — Progress Notes (Signed)
 Progress Note   Patient: Annette Foster [id number]:[id number] DOB: [birth year] DOA: 06/04/2023     3 DOS: the patient was seen and examined on 06/08/2023   Brief hospital course: 83 y.o. 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  She last underwent paracentesis twice in the past 2 weeks on 4/10 and 4/20 with removal of 4.8 And 4.2 L respectively but has had rapid reaccumulation of fluid in spite of compliance with medication.  Patient states that her oncologist referred her to GI for consideration of Pleurx drain but appointment is far away. ED course and data review: Soft BP at 114/55 with otherwise normal vitals Labs with baseline pancytopenia, WBC 2.3, hemoglobin 10.3 and platelets 81.  Creatinine 1.05 from baseline of of 0.82 Lipase and LFTs WNL EKG, personally viewed and interpreted showing dual paced rhythm with occasional PVCs at rate of 74 No imaging done Hospitalist consulted for admission.  4/24.  Patient frustrated that she has recurrent ascites and ends up needing to come back to the hospital.  Paracentesis today removed 4.1 L.  GI made referral to interventional radiology for TIPS procedure for Monday.  Patient had quite a bit of pain after paracentesis. 4/25.  Patient feeling better today.  Not having any abdominal pain that she had last night.  Still need to replace potassium. 4/26.  Patient's abdomen is still very distended.  Looking forward to trying a TIPS procedure to see if this can help out with her recurrent ascites. 4/27.  When I walked in she had the nausea bag in front of her.  Did not eat breakfast.  I  ordered a dose of Phenergan  prior to lunch.  When she gets distended she can eat.  Assessment and Plan: * Refractory ascites Paracentesis 4/24 removed 4.1 L of fluid.  Continue spironolactone  and Demadex .  TIPS procedure scheduled for Monday.  Patient feels nauseous when she gets distended  Hypokalemia Replaced  Liver cirrhosis secondary to NASH [facility]) Patient with pancytopenia, mild coagulopathy with an INR of 1.3, refractory ascites, history of esophageal varices.  Continue nadolol .  TIPS procedure Monday.  Pancytopenia (HCC) Secondary liver disease.  Last platelet count 58.  Last hemoglobin 9.2, white blood count 1.7.  Hypothyroidism, unspecified Continue levothyroxine   Chronic diastolic CHF (congestive heart failure) (HCC) Last EF 55%.  Torsemide  and nadolol   Hyperlipidemia, unspecified Continue Zocor   Hypotension On midodrine   Abdominal pain Patient a tender today with palpation secondary to distention.  Patient also nauseous with her abdominal distention.  Type 2 diabetes mellitus with complication, with long-term current use of insulin  (HCC) Last hemoglobin A1c back in January at 5.6.  Decreased long-acting insulin  to 5 units at daily instead of 20.  Will hold long-acting insulin  tomorrow morning for procedure.  Continue sliding scale insulin   AKI (acute kidney injury) (HCC) Cr was as low  as 0.7 on 4/25.  Cr 1.03 today. Watch with oral duiresis.        Subjective: Patient felt nauseous when I came in.  Had the nausea bag in front of her.  Vomited this morning.  Admitted with recurrent ascites.  Physical Exam: Vitals:   06/07/23 0830 06/07/23 1957 06/08/23 0336 06/08/23 0800  BP: (!) 102/56 133/61 Aaron Aas)  115/58 108/60  Pulse: 79 69 76 76  Resp: 16 20 18 16   Temp: 98.9 F (37.2 C) 97.9 F (36.6 C) 98.3 F (36.8 C) 98.6 F (37 C)  TempSrc: Oral Oral Oral Oral  SpO2: 100% 100% 96% 97%  Weight:      Height:       Physical Exam HENT:     Head: Normocephalic.   Eyes:     General: Lids are normal.     Conjunctiva/sclera: Conjunctivae normal.  Cardiovascular:     Rate and Rhythm: Normal rate and regular rhythm.     Heart sounds: Normal heart sounds, S1 normal and S2 normal.  Pulmonary:     Breath sounds: Examination of the right-lower field reveals decreased breath sounds. Examination of the left-lower field reveals decreased breath sounds. Decreased breath sounds present. No wheezing, rhonchi or rales.  Abdominal:     General: There is distension.     Palpations: Abdomen is soft.     Tenderness: There is generalized abdominal tenderness.  Musculoskeletal:     Right lower leg: No swelling.     Left lower leg: No swelling.  Skin:    General: Skin is warm.     Findings: No rash.  Neurological:     Mental Status: She is alert and oriented to person, place, and time.     Data Reviewed: Sodium 134, creatinine 1.06, calcium 8.6, ALT white blood cell count 1.7, hemoglobin 9.2, platelet count 58  Family Communication:   Disposition: Status is: Inpatient Remains inpatient appropriate because: For TIPS procedure tomorrow  Planned Discharge Destination: Home    Time spent: 28 minutes  Author: Verla Glaze, MD 06/08/2023 1:58 PM  For on call review [email or web address].

## 2023-06-08 NOTE — Plan of Care (Signed)

## 2023-06-09 ENCOUNTER — Encounter: Payer: Self-pay | Admitting: Internal Medicine

## 2023-06-09 ENCOUNTER — Other Ambulatory Visit: Payer: Self-pay

## 2023-06-09 ENCOUNTER — Inpatient Hospital Stay

## 2023-06-09 ENCOUNTER — Inpatient Hospital Stay: Admitting: Radiology

## 2023-06-09 DIAGNOSIS — I959 Hypotension, unspecified: Secondary | ICD-10-CM

## 2023-06-09 DIAGNOSIS — D61818 Other pancytopenia: Secondary | ICD-10-CM | POA: Diagnosis not present

## 2023-06-09 DIAGNOSIS — K7581 Nonalcoholic steatohepatitis (NASH): Secondary | ICD-10-CM | POA: Diagnosis not present

## 2023-06-09 DIAGNOSIS — E876 Hypokalemia: Secondary | ICD-10-CM | POA: Diagnosis not present

## 2023-06-09 DIAGNOSIS — R188 Other ascites: Secondary | ICD-10-CM | POA: Diagnosis not present

## 2023-06-09 HISTORY — PX: IR TIPS: IMG2295

## 2023-06-09 HISTORY — PX: IR PARACENTESIS: IMG2679

## 2023-06-09 LAB — COMPREHENSIVE METABOLIC PANEL WITH GFR
ALT: 18 U/L (ref 0–44)
AST: 33 U/L (ref 15–41)
Albumin: 3.1 g/dL — ABNORMAL LOW (ref 3.5–5.0)
Alkaline Phosphatase: 45 U/L (ref 38–126)
Anion gap: 5 (ref 5–15)
BUN: 15 mg/dL (ref 8–23)
CO2: 28 mmol/L (ref 22–32)
Calcium: 8.4 mg/dL — ABNORMAL LOW (ref 8.9–10.3)
Chloride: 103 mmol/L (ref 98–111)
Creatinine, Ser: 0.86 mg/dL (ref 0.44–1.00)
GFR, Estimated: >60 mL/min (ref >60)
Glucose, Bld: 94 mg/dL (ref 70–99)
Potassium: 3.5 mmol/L (ref 3.5–5.1)
Sodium: 136 mmol/L (ref 135–145)
Total Bilirubin: 0.7 mg/dL (ref 0.0–1.2)
Total Protein: 6.1 g/dL — ABNORMAL LOW (ref 6.5–8.1)

## 2023-06-09 LAB — CBC
HCT: 26.1 % — ABNORMAL LOW (ref 36.0–46.0)
Hemoglobin: 8.9 g/dL — ABNORMAL LOW (ref 12.0–15.0)
MCH: 31.7 pg (ref 26.0–34.0)
MCHC: 34.1 g/dL (ref 30.0–36.0)
MCV: 92.9 fL (ref 80.0–100.0)
Platelets: 56 10*3/uL — ABNORMAL LOW (ref 150–400)
RBC: 2.81 MIL/uL — ABNORMAL LOW (ref 3.87–5.11)
RDW: 14.2 % (ref 11.5–15.5)
WBC: 1.6 10*3/uL — ABNORMAL LOW (ref 4.0–10.5)
nRBC: 0 % (ref 0.0–0.2)

## 2023-06-09 LAB — GLUCOSE, CAPILLARY
Glucose-Capillary: 87 mg/dL (ref 70–99)
Glucose-Capillary: 89 mg/dL (ref 70–99)
Glucose-Capillary: 120 mg/dL — ABNORMAL HIGH (ref 70–99)
Glucose-Capillary: 149 mg/dL — ABNORMAL HIGH (ref 70–99)

## 2023-06-09 LAB — AMMONIA: Ammonia: 62 umol/L — ABNORMAL HIGH (ref 9–35)

## 2023-06-09 LAB — PROTIME-INR
INR: 1.4 — ABNORMAL HIGH (ref 0.8–1.2)
Prothrombin Time: 16.9 s — ABNORMAL HIGH (ref 11.4–15.2)

## 2023-06-09 MED ORDER — ONDANSETRON HCL 4 MG/2ML IJ SOLN
INTRAMUSCULAR | Status: AC
Start: 2023-06-09 — End: ?
  Filled 2023-06-09: qty 2

## 2023-06-09 MED ORDER — DEXAMETHASONE SODIUM PHOSPHATE 10 MG/ML IJ SOLN
INTRAMUSCULAR | Status: AC
  Filled 2023-06-09: qty 1

## 2023-06-09 MED ORDER — FENTANYL CITRATE (PF) 100 MCG/2ML IJ SOLN
INTRAMUSCULAR | Status: DC | PRN
  Administered 2023-06-09: 50 ug via INTRAVENOUS

## 2023-06-09 MED ORDER — LIDOCAINE HCL (PF) 2 % IJ SOLN
INTRAMUSCULAR | Status: AC
  Filled 2023-06-09: qty 5

## 2023-06-09 MED ORDER — PHENYLEPHRINE 80 MCG/ML (10ML) SYRINGE FOR IV PUSH (FOR BLOOD PRESSURE SUPPORT)
PREFILLED_SYRINGE | INTRAVENOUS | Status: DC | PRN
  Administered 2023-06-09 (×6): 80 ug via INTRAVENOUS

## 2023-06-09 MED ORDER — SODIUM CHLORIDE 0.9 % IV SOLN: INTRAVENOUS | Status: DC | PRN

## 2023-06-09 MED ORDER — LIDOCAINE HCL 1 % IJ SOLN
INTRAMUSCULAR | Status: AC
  Filled 2023-06-09: qty 20

## 2023-06-09 MED ORDER — PHENYLEPHRINE 80 MCG/ML (10ML) SYRINGE FOR IV PUSH (FOR BLOOD PRESSURE SUPPORT)
PREFILLED_SYRINGE | INTRAVENOUS | Status: AC
  Filled 2023-06-09: qty 10

## 2023-06-09 MED ORDER — SUGAMMADEX SODIUM 200 MG/2ML IV SOLN
INTRAVENOUS | Status: DC | PRN
  Administered 2023-06-09: 200 mg via INTRAVENOUS

## 2023-06-09 MED ORDER — PROPOFOL 10 MG/ML IV BOLUS
INTRAVENOUS | Status: DC | PRN
  Administered 2023-06-09: 150 mg via INTRAVENOUS
  Administered 2023-06-09: 50 ug/kg/min via INTRAVENOUS

## 2023-06-09 MED ORDER — SODIUM CHLORIDE 0.9 % IV SOLN
2.0000 g | Freq: Once | INTRAVENOUS | Status: AC
  Administered 2023-06-09: 2 g via INTRAVENOUS
  Filled 2023-06-09: qty 20

## 2023-06-09 MED ORDER — FENTANYL CITRATE (PF) 100 MCG/2ML IJ SOLN
INTRAMUSCULAR | Status: AC
  Filled 2023-06-09: qty 2

## 2023-06-09 MED ORDER — LIDOCAINE HCL (CARDIAC) PF 100 MG/5ML IV SOSY
PREFILLED_SYRINGE | INTRAVENOUS | Status: DC | PRN
  Administered 2023-06-09: 60 mg via INTRAVENOUS

## 2023-06-09 MED ORDER — PHENYLEPHRINE HCL-NACL 20-0.9 MG/250ML-% IV SOLN
INTRAVENOUS | Status: AC
  Filled 2023-06-09: qty 250

## 2023-06-09 MED ORDER — LIDOCAINE HCL 1 % IJ SOLN
15.0000 mL | Freq: Once | INTRAMUSCULAR | Status: DC
  Filled 2023-06-09: qty 15

## 2023-06-09 MED ORDER — DEXTROSE 50 % IV SOLN
25.0000 g | Freq: Once | INTRAVENOUS | Status: AC
  Administered 2023-06-09: 25 g via INTRAVENOUS
  Filled 2023-06-09: qty 50

## 2023-06-09 MED ORDER — ROCURONIUM BROMIDE 100 MG/10ML IV SOLN
INTRAVENOUS | Status: DC | PRN
  Administered 2023-06-09: 10 mg via INTRAVENOUS
  Administered 2023-06-09: 20 mg via INTRAVENOUS
  Administered 2023-06-09: 10 mg via INTRAVENOUS
  Administered 2023-06-09: 50 mg via INTRAVENOUS

## 2023-06-09 MED ORDER — PHENYLEPHRINE HCL-NACL 20-0.9 MG/250ML-% IV SOLN
INTRAVENOUS | Status: DC | PRN
Start: 2023-06-09 — End: 2023-06-09
  Administered 2023-06-09: 20 ug/min via INTRAVENOUS

## 2023-06-09 MED ORDER — LACTULOSE 10 GM/15ML PO SOLN
10.0000 g | Freq: Three times a day (TID) | ORAL | Status: DC
  Administered 2023-06-09 – 2023-06-13 (×12): 10 g via ORAL
  Filled 2023-06-09 (×12): qty 30

## 2023-06-09 MED ORDER — IOHEXOL 300 MG/ML  SOLN
100.0000 mL | Freq: Once | INTRAMUSCULAR | Status: AC | PRN
Start: 2023-06-09 — End: 2023-06-09
  Administered 2023-06-09: 100 mL via INTRAVENOUS

## 2023-06-09 MED ORDER — DEXAMETHASONE SODIUM PHOSPHATE 10 MG/ML IJ SOLN
INTRAMUSCULAR | Status: DC | PRN
  Administered 2023-06-09: 10 mg via INTRAVENOUS

## 2023-06-09 MED ORDER — ROCURONIUM BROMIDE 10 MG/ML (PF) SYRINGE
PREFILLED_SYRINGE | INTRAVENOUS | Status: AC
  Filled 2023-06-09: qty 10

## 2023-06-09 NOTE — Progress Notes (Signed)
 Progress Note   Patient: Annette Foster BJY:[id number] DOB: 20-Oct-1940 DOA: 06/04/2023     4 DOS: the patient was seen and examined on 06/09/2023   Brief hospital course: 83 y.o. 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  She last underwent paracentesis twice in the past 2 weeks on 4/10 and 4/20 with removal of 4.8 And 4.2 L respectively but has had rapid reaccumulation of fluid in spite of compliance with medication.  Patient states that her oncologist referred her to GI for consideration of Pleurx drain but appointment is far away. ED course and data review: Soft BP at 114/55 with otherwise normal vitals Labs with baseline pancytopenia, WBC 2.3, hemoglobin 10.3 and platelets 81.  Creatinine 1.05 from baseline of of 0.82 Lipase and LFTs WNL EKG, personally viewed and interpreted showing dual paced rhythm with occasional PVCs at rate of 74 No imaging done Hospitalist consulted for admission.  4/24.  Patient frustrated that she has recurrent ascites and ends up needing to come back to the hospital.  Paracentesis today removed 4.1 L.  GI made referral to interventional radiology for TIPS procedure for Monday.  Patient had quite a bit of pain after paracentesis. 4/25.  Patient feeling better today.  Not having any abdominal pain that she had last night.  Still need to replace potassium. 4/26.  Patient's abdomen is still very distended.  Looking forward to trying a TIPS procedure to see if this can help out with her recurrent ascites. 4/27.  When I walked in she had the nausea bag in front of her.  Did not eat breakfast.  I  ordered a dose of Phenergan  prior to lunch.  When she gets distended she can eat. 4/28.  Patient feels distended.  For TIPS procedure today.  Assessment and Plan: * Refractory ascites Paracentesis 4/24 removed 4.1 L of fluid.  Continue spironolactone  and Demadex .  TIPS procedure scheduled for today and will likely need paracentesis also.  Patient feels nauseous when she gets distended  Hypokalemia Replaced  Liver cirrhosis secondary to [facility]) Patient with pancytopenia, mild coagulopathy with an INR of 1.4, refractory ascites, history of esophageal varices.  Continue nadolol .  TIPS procedure today.  Pancytopenia (HCC) Secondary liver disease.  Last platelet count 56.  Last hemoglobin 8.9, white blood count 1.6.  Hypothyroidism, unspecified Continue levothyroxine   Chronic diastolic CHF (congestive heart failure) (HCC) Last EF 55%.  Torsemide  and nadolol   Hyperlipidemia, unspecified Continue Zocor   Hypotension On midodrine   Abdominal pain Patient a tender today with palpation secondary to distention.  Patient also nauseous with her abdominal distention.  Type 2 diabetes mellitus with complication, with long-term current use of insulin  (HCC) Last hemoglobin A1c back in January at 5.6.  Decreased long-acting insulin  to 5 units at daily instead of 20.  Will hold long-acting insulin  today for procedure.   AKI (acute kidney injury) (HCC) Cr was as low  as 0.7 on 4/25.  Creatinine has high as 1.09 on 4/26.  Cr 0.86 today. Watch with oral duiresis.        Subjective: Patient still feeling distended and feels uncomfortable.  Still has a little abdominal pain with the distention.  Had nausea yesterday.  Physical Exam: Vitals:  06/08/23 1736 06/08/23 1932 06/09/23 0401 06/09/23 0807  BP: (!) 119/57 (!) 113/55 (!) 109/54 (!) 107/57  Pulse: 73 67 78 68  Resp: 18 18 16 18   Temp: 97.9 F (36.6 C) 98.8 F (37.1 C) 98.5 F (36.9 C) 98.2 F (36.8 C)  TempSrc: Oral Oral Oral  Oral  SpO2: 100% 100% 95% 98%  Weight:      Height:       Physical Exam HENT:     Head: Normocephalic.  Eyes:     General: Lids are normal.     Conjunctiva/sclera: Conjunctivae normal.  Cardiovascular:     Rate and Rhythm: Normal rate and regular rhythm.     Heart sounds: Normal heart sounds, S1 normal and S2 normal.  Pulmonary:     Breath sounds: Examination of the right-lower field reveals decreased breath sounds. Examination of the left-lower field reveals decreased breath sounds. Decreased breath sounds present. No wheezing, rhonchi or rales.  Abdominal:     General: There is distension.     Palpations: Abdomen is soft.     Tenderness: There is generalized abdominal tenderness.  Musculoskeletal:     Right lower leg: No swelling.     Left lower leg: No swelling.  Skin:    General: Skin is warm.     Findings: No rash.  Neurological:     Mental Status: She is alert and oriented to person, place, and time.     Data Reviewed: Creatinine 0.86, ammonia level 62, total bilirubin 0.7  Family Communication: Updated daughter yesterday  Disposition: Status is: Inpatient Remains inpatient appropriate because: For TIPS procedure likely another paracentesis today.  Planned Discharge Destination: Home    Time spent: 26 minutes  Author: Verla Glaze, MD 06/09/2023 11:25 AM  For on call review [email or web address].

## 2023-06-09 NOTE — Progress Notes (Signed)
 Patient clinically stable post TIPS procedure per Dr Marne Sings, tolerated well. Managed during procedure with CRNA intubated for procedure. Post procedure patient extubated with stable vitals.arouses to name. Transported to PACU with report given to Georgianne Kirsten RN at bedside along with report given by CRNA as well.

## 2023-06-09 NOTE — Anesthesia Preprocedure Evaluation (Signed)
 Anesthesia Evaluation  Patient identified by MRN, date of birth, ID band Patient awake    Reviewed: Allergy & Precautions, NPO status , Patient's Chart, lab work & pertinent test results  History of Anesthesia Complications (+) PONV and history of anesthetic complications  Airway Mallampati: III  TM Distance: <3 FB Neck ROM: full    Dental  (+) Chipped, Poor Dentition, Missing, Partial Lower, Upper Dentures, Dental Advidsory Given   Pulmonary shortness of breath, sleep apnea , pneumonia, unresolved, neg COPD, neg recent URI   Pulmonary exam normal        Cardiovascular hypertension, (-) angina + CAD and +CHF  (-) Cardiac Stents + dysrhythmias Atrial Fibrillation + pacemaker + Valvular Problems/Murmurs      Neuro/Psych negative neurological ROS  negative psych ROS   GI/Hepatic ,GERD  Controlled,,(+) Cirrhosis   Esophageal Varices and ascites    , Hepatitis -Hx of esophageal varices   Endo/Other  diabetesHypothyroidism    Renal/GU      Musculoskeletal   Abdominal   Peds  Hematology  (+) Blood dyscrasia, anemia   Anesthesia Other Findings Past Medical History: No date: Abdominal pain No date: Allergy 2017: Cancer (HCC)     Comment:  breast cancer- Left No date: Cataract No date: CHF (congestive heart failure) (HCC) No date: Collagenous colitis No date: Coronary artery disease No date: Diabetes mellitus without complication (HCC) No date: Diarrhea No date: Diverticulosis No date: Dysrhythmia No date: Fatty liver disease, nonalcoholic No date: Fibrocystic breast No date: GERD (gastroesophageal reflux disease) No date: Heart murmur No date: Hyperlipidemia No date: Hypertension No date: Hypothyroidism No date: IBS (irritable bowel syndrome) No date: IDA (iron  deficiency anemia) No date: Liver cirrhosis (HCC) 2017: Personal history of radiation therapy     Comment:  LEFT BREAST CA No date: PONV  (postoperative nausea and vomiting) No date: Presence of permanent cardiac pacemaker No date: Sleep apnea     Comment:  C-Pap  Past Surgical History: No date: ABDOMINAL HYSTERECTOMY     Comment:  [place] No date: ABDOMINAL SURGERY No date: APPENDECTOMY 05/17/2021: AV NODE ABLATION; N/A     Comment:  Procedure: AV NODE ABLATION;  Surgeon: Boyce Byes, MD;  Location: [facility];  Service:               Cardiovascular;  Laterality: N/A; 2016: BREAST BIOPSY; Bilateral     Comment:  negative 09/11/2015: BREAST BIOPSY; Left     Comment:  DCIS, papillary carcinoma in situ 05/27/2016: BREAST BIOPSY; Left     Comment:  BENIGN MAMMARY EPITHELIUM 11/20/2017: BREAST BIOPSY; Left     Comment:  affirm bx x clip BENIGN MAMMARY EPITHELIUM CONSISTENT               WITH RAD THERAPY No date: BREAST EXCISIONAL BIOPSY; Right     Comment:  NEG 1980's 10/17/2015: BREAST LUMPECTOMY; Left     Comment:  DCIS and papillary carcinoma insitu, clear margins No date: CARDIAC SURGERY     Comment:  has replacement valve No date: CATARACT EXTRACTION; Right 01/16/2021: CATARACT EXTRACTION W/PHACO; Left     Comment:  Procedure: CATARACT EXTRACTION PHACO AND INTRAOCULAR               LENS PLACEMENT (IOC) LEFT DIABETIC 8.46 00:59.8;                Surgeon: Clair Crews, MD;  Location:  [facility];  Service: Ophthalmology;  Laterality: Left; No date: CHOLECYSTECTOMY 03/11/2016: COLONOSCOPY WITH PROPOFOL ; N/A     Comment:  Procedure: COLONOSCOPY WITH PROPOFOL ;  Surgeon: Cassie Click, MD;  Location: [facility] ENDOSCOPY;  Service:               Endoscopy;  Laterality: N/A; 02/18/2020: COLONOSCOPY WITH PROPOFOL ; N/A     Comment:  Procedure: COLONOSCOPY WITH PROPOFOL ;  Surgeon: Marnee Sink, MD;  Location: [facility] ENDOSCOPY;  Service:               Endoscopy;  Laterality: N/A; 09/10/2022: COLONOSCOPY WITH PROPOFOL ; N/A     Comment:   Procedure: COLONOSCOPY WITH PROPOFOL ;  Surgeon: Luke Salaam, MD;  Location: [facility] ENDOSCOPY;  Service:               Gastroenterology;  Laterality: N/A; 09/10/2022: ESOPHAGEAL BANDING     Comment:  Procedure: ESOPHAGEAL BANDING;  Surgeon: Luke Salaam,               MD;  Location: [facility] ENDOSCOPY;  Service:               Gastroenterology;; 03/11/2016: ESOPHAGOGASTRODUODENOSCOPY (EGD) WITH PROPOFOL ; N/A     Comment:  Procedure: ESOPHAGOGASTRODUODENOSCOPY (EGD) WITH               PROPOFOL ;  Surgeon: Cassie Click, MD;  Location: [facility]              ENDOSCOPY;  Service: Endoscopy;  Laterality: N/A; 02/18/2020: ESOPHAGOGASTRODUODENOSCOPY (EGD) WITH PROPOFOL ; N/A     Comment:  Procedure: ESOPHAGOGASTRODUODENOSCOPY (EGD) WITH               PROPOFOL ;  Surgeon: Marnee Sink, MD;  Location: [facility]               ENDOSCOPY;  Service: Endoscopy;  Laterality: N/A; 04/25/2020: ESOPHAGOGASTRODUODENOSCOPY (EGD) WITH PROPOFOL ; N/A     Comment:  Procedure: ESOPHAGOGASTRODUODENOSCOPY (EGD) WITH               PROPOFOL ;  Surgeon: Marnee Sink, MD;  Location: [facility]               ENDOSCOPY;  Service: Endoscopy;  Laterality: N/A; 05/23/2020: ESOPHAGOGASTRODUODENOSCOPY (EGD) WITH PROPOFOL ; N/A     Comment:  Procedure: ESOPHAGOGASTRODUODENOSCOPY (EGD) WITH               PROPOFOL ;  Surgeon: Marnee Sink, MD;  Location: [facility]               ENDOSCOPY;  Service: Endoscopy;  Laterality: N/A; 09/10/2022: ESOPHAGOGASTRODUODENOSCOPY (EGD) WITH PROPOFOL ; N/A     Comment:  Procedure: ESOPHAGOGASTRODUODENOSCOPY (EGD) WITH               PROPOFOL ;  Surgeon: Luke Salaam, MD;  Location: [facility]               ENDOSCOPY;  Service: Gastroenterology;  Laterality: N/A; 10/04/2021: EUS; N/A     Comment:  Procedure: LOWER ENDOSCOPIC ULTRASOUND (EUS);  Surgeon:               Rayford Cake, MD;  Location: [facility] ENDOSCOPY;  Service: Gastroenterology;  Laterality: N/A;  [facility] No date: EYE SURGERY 09/03/2018:  FINGER ARTHROSCOPY WITH CARPOMETACARPEL ([facility])  ARTHROPLASTY; Right     Comment:  Procedure: CARPOMETACARPEL [facility]) ARTHROPLASTY RIGHT               THUMB;  Surgeon: Molli Angelucci, MD;  Location: [facility] ORS;               Service: Orthopedics;  Laterality: Right; 09/14/2021: FLEXIBLE SIGMOIDOSCOPY; N/A     Comment:  Procedure: FLEXIBLE SIGMOIDOSCOPY;  Surgeon: Marnee Sink, MD;  Location: [facility] ENDOSCOPY;  Service:               Endoscopy;  Laterality: N/A;  No anesthesia 09/03/2018: GANGLION CYST EXCISION; Right     Comment:  Procedure: REMOVAL GANGLION OF WRIST;  Surgeon: Molli Angelucci, MD;  Location: [facility] ORS;  Service: Orthopedics;               Laterality: Right; 09/03/2018: HARDWARE REMOVAL; Right     Comment:  Procedure: HARDWARE REMOVAL RIGHT THUMB;  Surgeon: Molli Angelucci, MD;  Location: [facility] ORS;  Service: Orthopedics;               Laterality: Right;  staple removed 09/10/2022: HEMOSTASIS CLIP PLACEMENT     Comment:  Procedure: HEMOSTASIS CLIP PLACEMENT;  Surgeon: Luke Salaam, MD;  Location: [facility] ENDOSCOPY;  Service:               Gastroenterology;; 09/10/2022: HEMOSTASIS CONTROL     Comment:  Procedure: HEMOSTASIS CONTROL;  Surgeon: Luke Salaam,               MD;  Location: [facility] ENDOSCOPY;  Service:               Gastroenterology;; No date: JOINT REPLACEMENT; Left     Comment:  TKR No date: left sinusplasty  10/17/2015: MASTECTOMY, PARTIAL; Left     Comment:  Procedure: MASTECTOMY PARTIAL REVISION;  Surgeon: Benancio Bracket, MD;  Location: [facility] ORS;  Service: General;              Laterality: Left; 03/23/2021: PACEMAKER IMPLANT; N/A     Comment:  Procedure: PACEMAKER IMPLANT;  Surgeon: Boyce Byes, MD;  Location: [facility];  Service:               Cardiovascular;  Laterality: N/A; 09/29/2015: PARTIAL MASTECTOMY WITH NEEDLE LOCALIZATION; Left     Comment:  Procedure: PARTIAL  MASTECTOMY WITH NEEDLE LOCALIZATION;               Surgeon: Benancio Bracket, MD;  Location: [facility] ORS;                Service: General;  Laterality: Left; 09/10/2022: POLYPECTOMY     Comment:  Procedure: POLYPECTOMY;  Surgeon: Luke Salaam, MD;                Location: [facility] ENDOSCOPY;  Service: Gastroenterology;; 10/05/2021: RECTAL EXAM UNDER ANESTHESIA; N/A     Comment:  Procedure:  RECTAL EXAM UNDER ANESTHESIA, ASPIRATION OF               RECTAL CYST;  Surgeon: Eldred Grego, MD;                Location: [facility] ORS;  Service: General;  Laterality: N/A; No date: TOTAL ABDOMINAL HYSTERECTOMY W/ BILATERAL SALPINGOOPHORECTOMY  BMI    Body Mass Index: 25.15 kg/m      Reproductive/Obstetrics negative OB ROS                             Anesthesia Physical Anesthesia Plan  ASA: 4  Anesthesia Plan: General ETT   Post-op Pain Management:    Induction: Intravenous  PONV Risk Score and Plan: 4 or greater and Ondansetron  and Dexamethasone   Airway Management Planned: Oral ETT  Additional Equipment:   Intra-op Plan:   Post-operative Plan: Extubation in OR  Informed Consent: I have reviewed the patients History and Physical, chart, labs and discussed the procedure including the risks, benefits and alternatives for the proposed anesthesia with the patient or authorized representative who has indicated his/her understanding and acceptance.     Dental Advisory Given  Plan Discussed with: Anesthesiologist, CRNA and Surgeon  Anesthesia Plan Comments: (Patient consented for risks of anesthesia including but not limited to:  - adverse reactions to medications - damage to eyes, teeth, lips or other oral mucosa - nerve damage due to positioning  - sore throat or hoarseness - Damage to heart, brain, nerves, lungs, other parts of body or loss of life  Patient voiced understanding and assent.)       Anesthesia Quick Evaluation

## 2023-06-09 NOTE — Plan of Care (Signed)

## 2023-06-09 NOTE — Transfer of Care (Signed)
 Immediate Anesthesia Transfer of Care Note  Patient: Annette Foster  Procedure(s) Performed: IR TIPS  Patient Location: PACU  Anesthesia Type:General  Level of Consciousness: drowsy  Airway & Oxygen Therapy: Patient Spontanous Breathing and Patient connected to face mask oxygen  Post-op Assessment: Report given to RN and Post -op Vital signs reviewed and stable  Post vital signs: Reviewed and stable  Last Vitals:  Vitals Value Taken Time  BP 111/52 1623  Temp 36.8 1624  Pulse 72 1626  Resp 16 1626  SpO2 100 1626    Last Pain:  Vitals:   06/09/23 1256  TempSrc: Oral  PainSc: 0-No pain      Patients Stated Pain Goal: 2 (06/06/23 2256)  Pt transported to PACU with circ RN from IR to PACU. Patent airway to PACU, breathing spontaneously on 6L O2 via FM. Pt sleep, appears comfortable.  Complications: There were no known notable events for this encounter.

## 2023-06-09 NOTE — Anesthesia Procedure Notes (Signed)
 Procedure Name: Intubation Date/Time: 06/09/2023 1:45 PM  Performed by: Simona Dublin, CRNAPre-anesthesia Checklist: Patient identified, Emergency Drugs available, Suction available and Patient being monitored Patient Re-evaluated:Patient Re-evaluated prior to induction Oxygen Delivery Method: Circle system utilized Preoxygenation: Pre-oxygenation with 100% oxygen Induction Type: IV induction Ventilation: Mask ventilation without difficulty Laryngoscope Size: McGrath and 3 Grade View: Grade I Tube type: Oral Tube size: 6.5 mm Number of attempts: 1 Airway Equipment and Method: Stylet and Oral airway Placement Confirmation: ETT inserted through vocal cords under direct vision, positive ETCO2 and breath sounds checked- equal and bilateral Secured at: 19 cm Tube secured with: Tape Dental Injury: Teeth and Oropharynx as per pre-operative assessment

## 2023-06-09 NOTE — Procedures (Signed)
 Interventional Radiology Procedure Note  Procedure:  1.) Paracentesis 2.) TIPS   Complications: None  Estimated Blood Loss: None  Recommendations: - Bedrest tonight - Begin lactulose  10 mg TID titrate to 2-3 soft stools daily - CBC, CMP and INR in the am - Resume diet  Signed,  Roxie Cord, MD

## 2023-06-10 DIAGNOSIS — E876 Hypokalemia: Secondary | ICD-10-CM | POA: Diagnosis not present

## 2023-06-10 DIAGNOSIS — R188 Other ascites: Secondary | ICD-10-CM | POA: Diagnosis not present

## 2023-06-10 DIAGNOSIS — K7581 Nonalcoholic steatohepatitis (NASH): Secondary | ICD-10-CM | POA: Diagnosis not present

## 2023-06-10 DIAGNOSIS — R1084 Generalized abdominal pain: Secondary | ICD-10-CM | POA: Diagnosis not present

## 2023-06-10 LAB — COMPREHENSIVE METABOLIC PANEL WITH GFR
ALT: 41 U/L (ref 0–44)
AST: 84 U/L — ABNORMAL HIGH (ref 15–41)
Albumin: 2.9 g/dL — ABNORMAL LOW (ref 3.5–5.0)
Alkaline Phosphatase: 45 U/L (ref 38–126)
Anion gap: 10 (ref 5–15)
BUN: 19 mg/dL (ref 8–23)
CO2: 24 mmol/L (ref 22–32)
Calcium: 8.7 mg/dL — ABNORMAL LOW (ref 8.9–10.3)
Chloride: 104 mmol/L (ref 98–111)
Creatinine, Ser: 0.84 mg/dL (ref 0.44–1.00)
GFR, Estimated: >60 mL/min (ref >60)
Glucose, Bld: 144 mg/dL — ABNORMAL HIGH (ref 70–99)
Potassium: 3.4 mmol/L — ABNORMAL LOW (ref 3.5–5.1)
Sodium: 138 mmol/L (ref 135–145)
Total Bilirubin: 1.6 mg/dL — ABNORMAL HIGH (ref 0.0–1.2)
Total Protein: 6 g/dL — ABNORMAL LOW (ref 6.5–8.1)

## 2023-06-10 LAB — GLUCOSE, CAPILLARY
Glucose-Capillary: 104 mg/dL — ABNORMAL HIGH (ref 70–99)
Glucose-Capillary: 125 mg/dL — ABNORMAL HIGH (ref 70–99)
Glucose-Capillary: 144 mg/dL — ABNORMAL HIGH (ref 70–99)
Glucose-Capillary: 133 mg/dL — ABNORMAL HIGH (ref 70–99)
Glucose-Capillary: 116 mg/dL — ABNORMAL HIGH (ref 70–99)
Glucose-Capillary: 149 mg/dL — ABNORMAL HIGH (ref 70–99)

## 2023-06-10 LAB — CBC
HCT: 31 % — ABNORMAL LOW (ref 36.0–46.0)
Hemoglobin: 10.7 g/dL — ABNORMAL LOW (ref 12.0–15.0)
MCH: 31.8 pg (ref 26.0–34.0)
MCHC: 34.5 g/dL (ref 30.0–36.0)
MCV: 92.3 fL (ref 80.0–100.0)
Platelets: 78 10*3/uL — ABNORMAL LOW (ref 150–400)
RBC: 3.36 MIL/uL — ABNORMAL LOW (ref 3.87–5.11)
RDW: 14.7 % (ref 11.5–15.5)
WBC: 3.7 10*3/uL — ABNORMAL LOW (ref 4.0–10.5)
nRBC: 0 % (ref 0.0–0.2)

## 2023-06-10 LAB — PROTIME-INR
INR: 1.6 — ABNORMAL HIGH (ref 0.8–1.2)
Prothrombin Time: 19 s — ABNORMAL HIGH (ref 11.4–15.2)

## 2023-06-10 MED ORDER — POTASSIUM CHLORIDE CRYS ER 20 MEQ PO TBCR
20.0000 meq | EXTENDED_RELEASE_TABLET | Freq: Once | ORAL | Status: AC
  Administered 2023-06-10: 20 meq via ORAL
  Filled 2023-06-10: qty 1

## 2023-06-10 MED ORDER — ALBUMIN HUMAN 25 % IV SOLN
12.5000 g | Freq: Once | INTRAVENOUS | Status: AC
  Administered 2023-06-11: 12.5 g via INTRAVENOUS
  Filled 2023-06-10: qty 50

## 2023-06-10 MED ORDER — PANTOPRAZOLE SODIUM 40 MG PO TBEC
40.0000 mg | DELAYED_RELEASE_TABLET | Freq: Two times a day (BID) | ORAL | Status: DC
  Administered 2023-06-10 – 2023-06-17 (×15): 40 mg via ORAL
  Filled 2023-06-10 (×15): qty 1

## 2023-06-10 NOTE — Evaluation (Signed)
 Occupational Therapy Evaluation Patient Details Name: Annette Foster MRN: [id number] DOB: 02/11/1941 Today's Date: 06/10/2023   History of Present Illness   Pt is an 83 yo female admitted for recurrent ascites. Pt underwent TIPs procedure and paracentesis. PMH of afib, HTN, CHF, pacemaker, DM, breast cancer, GERD, hypothyroidism, OSA on CPAP, nonalcoholic liver cirrhosis, with pancytopenia, portal hypertensive gastropathy, grade 2 esophageal varices s/p banding 05/13/2023.     Clinical Impressions Pt was seen for OT evaluation this date. Prior to hospital admission, pt was independent. Pt very eager to return to PLOF. Pt lives by herself btu has family support. Pt presents to acute OT demonstrating impaired ADL performance and functional mobility 2/2 decreased strength, activity tolerance, balance, and abdominal pain (See OT problem list for additional functional deficits). Pt currently requires CGA for ADL transfers and mobility with RW and PRN MIN A for LB ADL tasks. Pt/dtr educated in AE/DME for LB ADL tasks to improve comfort and minimize falls and pain. Pt/dtr verbalized understanding. Pt would benefit from skilled OT services to address noted impairments and functional limitations (see below for any additional details) in order to maximize safety and independence while minimizing falls risk and caregiver burden.    If plan is discharge home, recommend the following:   A little help with walking and/or transfers;A little help with bathing/dressing/bathroom;Assist for transportation;Assistance with cooking/housework;Help with stairs or ramp for entrance     Functional Status Assessment   Patient has had a recent decline in their functional status and demonstrates the ability to make significant improvements in function in a reasonable and predictable amount of time.     Equipment Recommendations   BSC/3in1;Other (comment) (2ww)      Precautions/Restrictions    Precautions Precautions: Fall Recall of Precautions/Restrictions: Intact Restrictions Weight Bearing Restrictions Per Provider Order: No     Mobility Bed Mobility     Transfers Overall transfer level: Needs assistance Equipment used: Rolling walker (2 wheels) Transfers: Sit to/from Stand, Bed to chair/wheelchair/BSC Sit to Stand: Contact guard assist     Step pivot transfers: Contact guard assist     General transfer comment: VC for RW      Balance Overall balance assessment: Needs assistance Sitting-balance support: Feet supported Sitting balance-Leahy Scale: Fair     Standing balance support: Bilateral upper extremity supported, During functional activity, Single extremity supported Standing balance-Leahy Scale: Fair        ADL either performed or assessed with clinical judgement   ADL Overall ADL's : Needs assistance/impaired    Lower Body Dressing Details (indicate cue type and reason): PRN MIN A Toilet Transfer: Contact guard assist;Minimal assistance;BSC/3in1;Rolling walker (2 wheels)   Toileting- Clothing Manipulation and Hygiene: Contact guard assist;Set up;Sit to/from stand          Pertinent Vitals/Pain Pain Assessment Pain Assessment: 0-10 Pain Score: 8  Pain Location: abdomen Pain Descriptors / Indicators: Aching, Grimacing, Headache Pain Intervention(s): Limited activity within patient's tolerance, Monitored during session, Premedicated before session, Repositioned     Extremity/Trunk Assessment Upper Extremity Assessment Upper Extremity Assessment: Generalized weakness   Lower Extremity Assessment Lower Extremity Assessment: Generalized weakness       Communication Communication Communication: No apparent difficulties   Cognition Arousal: Alert Behavior During Therapy: WFL for tasks assessed/performed        Following commands: Intact          Exercises Other Exercises Other Exercises: Pt/dtr educated in AE/DME for LB ADL  tasks to improve comfort and minimize falls and  pain        Home Living Family/patient expects to be discharged to:: Private residence Living Arrangements: Alone Available Help at Discharge: Family Type of Home: House Home Access: Stairs to enter Secretary/administrator of Steps: 1 Entrance Stairs-Rails: Left Home Layout: One level      Home Equipment: None          Prior Functioning/Environment  Mobility Comments: independent with mobility ADLs Comments: daughter assists with IADLs    OT Problem List: Decreased strength;Pain;Decreased activity tolerance;Impaired balance (sitting and/or standing);Decreased knowledge of use of DME or AE   OT Treatment/Interventions: Self-care/ADL training;Therapeutic exercise;Therapeutic activities;Energy conservation;DME and/or AE instruction;Patient/family education;Balance training      OT Goals(Current goals can be found in the care plan section)   Acute Rehab OT Goals Patient Stated Goal: go home OT Goal Formulation: With patient/family Time For Goal Achievement: 06/24/23 Potential to Achieve Goals: Good ADL Goals Pt Will Perform Lower Body Dressing: with modified independence;sit to/from stand;with adaptive equipment Pt Will Transfer to Toilet: with modified independence;ambulating (LRAD) Pt Will Perform Toileting - Clothing Manipulation and hygiene: with modified independence Additional ADL Goal #1: Pt will verbalize plan to impelment at least 1 learned falls rpevention strategy.   OT Frequency:  Min 1X/week       AM-PAC OT "6 Clicks" Daily Activity     Outcome Measure Help from another person eating meals?: None Help from another person taking care of personal grooming?: None Help from another person toileting, which includes using toliet, bedpan, or urinal?: A Little Help from another person bathing (including washing, rinsing, drying)?: A Little Help from another person to put on and taking off regular upper body  clothing?: None Help from another person to put on and taking off regular lower body clothing?: A Little 6 Click Score: 21   End of Session Equipment Utilized During Treatment: Rolling walker (2 wheels) Nurse Communication: Mobility status;Patient requests pain meds  Activity Tolerance: Patient tolerated treatment well Patient left: Other (comment) (seated on BSC, call bell in reach, dtr present)  OT Visit Diagnosis: Other abnormalities of gait and mobility (R26.89);Muscle weakness (generalized) (M62.81)                Time: 8676-7209 OT Time Calculation (min): 25 min Charges:  OT General Charges $OT Visit: 1 Visit OT Evaluation $OT Eval Low Complexity: 1 Low OT Treatments $Self Care/Home Management : 8-22 mins  Berenda Breaker., MPH, MS, OTR/L ascom [phone] 06/10/23, 4:17 PM

## 2023-06-10 NOTE — Progress Notes (Signed)
 Patient ID: Annette Foster, female   DOB: October 10, 1940, 83 y.o.   MRN: [id number]    Referring Physician(s): Dr. 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 Marne Sings. Pt reports doing well, aside from some mild generalized abd tenderness and decreased appetite. Reports no pain at neck or groin catheter insertion sites. Pt says she feels like her abd tenderness is similar to past times when she has had ascites and required paracentesis - pt did have paracentesis yesterday prior to TIPS with about 1.5L drawn off. In the room today, hospitalist ordered a repeat US  paracentesis to eval if there is remaining fluid to drain.  Allergies: Codeine, Demeclocycline, Demerol [meperidine], Hydrocodone, Morphine , Other, Oxycodone , Pentazocine, Tetracyclines & related, Coal tar extract, Hydrocodone-acetaminophen , Salicylic acid, and Tetracycline hcl  Medications: Prior to Admission medications   Medication Sig Start Date End Date Taking? Authorizing Provider  albuterol  (VENTOLIN  HFA) 108 (90 Base) MCG/ACT inhaler Inhale 2 puffs into the lungs every 4 (four) hours as needed for wheezing or shortness of breath. 04/30/23 04/29/24 Yes [provider]  Cholecalciferol  (VITAMIN D ) 50 MCG (2000 UT) CAPS Take 2,000 Units by mouth daily.   Yes [provider]  clobetasol  ointment (TEMOVATE ) 0.05 % Apply 1 application topically as needed (vaginal irritation).   Yes [provider]  clotrimazole  (LOTRIMIN ) 1 % cream Apply 1 Application topically 2 (two) times daily as needed.   Yes [provider]  cyanocobalamin (,VITAMIN B-12,) 1000 MCG/ML injection 1,000 mcg every 30 (thirty) days. 04/23/19  Yes [provider]  dicyclomine  (BENTYL ) 10 MG capsule Take 10 mg by mouth in the morning, at noon, and at bedtime.  05/16/19  Yes [provider]  docusate sodium  (COLACE) 100 MG capsule Take 100 mg by mouth 3 (three) times daily as needed for moderate constipation. 01/10/20  Yes [provider]  esomeprazole (NEXIUM) 40 MG capsule Take 40 mg by mouth 2 (two) times daily before a meal.  02/08/16  Yes [provider]  fenofibrate  160 MG tablet Take 160 mg by mouth at bedtime.   Yes [provider]  gabapentin  (NEURONTIN ) 300 MG capsule Take 300 mg by mouth 2 (two) times daily. 09/07/16 10/25/36 Yes Westley Hammers, MD  insulin  degludec (TRESIBA  FLEXTOUCH) 200 UNIT/ML FlexTouch Pen 200 Units at bedtime.   Yes [provider]  ipratropium (ATROVENT) 0.03 % nasal spray Place 1 spray into both nostrils 2 (two) times daily as needed for rhinitis. 04/15/23 04/14/24 Yes [provider]  levocetirizine (XYZAL) 5 MG tablet Take 5 mg by mouth every evening.   Yes [provider]  levothyroxine  (SYNTHROID , LEVOTHROID) 75 MCG tablet Take 75 mcg by mouth daily before breakfast.    Yes [provider]  magnesium  oxide (MAG-OX) 400 MG tablet Take 400 mg by mouth 2 (two) times daily.    Yes [provider]  midodrine  (PROAMATINE ) 5 MG tablet Take 1 tablet (5 mg total) by mouth 3 (three) times daily with meals. 05/31/23  Yes Luna Salinas, MD  MOUNJARO  15 MG/0.5ML Pen Inject 15 mg into the skin once a week. 03/18/23  Yes [provider]  nadolol  (CORGARD ) 20 MG tablet Take 1 tablet (20 mg total) by mouth daily. 12/20/22  Yes End, Veryl Gottron, MD  NOVOLOG  FLEXPEN 100 UNIT/ML FlexPen Inject 38 Units into the skin 3 (  three) times daily with meals. 05/27/23  Yes [provider]  ondansetron  (ZOFRAN ) 8 MG tablet One pill every 8 hours as needed for nausea/vomitting. Patient taking differently: Take 8 mg by mouth every 8 (eight) hours as needed for vomiting or nausea. One pill every 8 hours as needed for nausea/vomitting. 06/04/23  Yes Brahmanday, Govinda  R, MD  saccharomyces boulardii (FLORASTOR) 250 MG capsule Take 250 mg by mouth 2 (two) times daily.    Yes [provider]  simvastatin  (ZOCOR ) 20 MG tablet Take 20 mg by mouth at bedtime.   Yes [provider]  spironolactone  (ALDACTONE ) 25 MG tablet Take 1 tablet (25 mg total) by mouth daily. 05/22/23  Yes Garrison Kanner, MD  torsemide  (DEMADEX ) 20 MG tablet Take 40 mg by mouth daily.   Yes [provider]  traMADol  (ULTRAM ) 50 MG tablet Take 1 tablet (50 mg total) by mouth every 8 (eight) hours as needed. Patient taking differently: Take 50 mg by mouth every 8 (eight) hours as needed for moderate pain (pain score 4-6). 06/04/23  Yes Brahmanday, Govinda R, MD  valACYclovir  (VALTREX ) 500 MG tablet Take 500 mg by mouth 2 (two) times daily as needed. 02/10/23  Yes [provider]     Vital Signs: BP (!) 112/46 (BP Location: Right Arm)   Pulse 66   Temp 97.6 F (36.4 C)   Resp 16   Ht 5\' 3"  (1.6 m)   Wt 151 lb (68.5 kg)   LMP  (LMP Unknown)   SpO2 100%   BMI 26.75 kg/m   Physical Exam Vitals and nursing note reviewed.  Constitutional:      Appearance: Normal appearance.  HENT:     Mouth/Throat:     Mouth: Mucous membranes are moist.     Pharynx: Oropharynx is clear.  Cardiovascular:     Rate and Rhythm: Normal rate and regular rhythm.  Pulmonary:     Effort: Pulmonary effort is normal.     Breath sounds: Normal breath sounds.  Abdominal:     Tenderness: There is abdominal tenderness (mild ttp in all 4 quadrants).     Comments: Very mildly distended, largely soft abdomen  Musculoskeletal:     Right lower leg: No edema.     Left lower leg: No edema.  Skin:    General: Skin is warm and dry.  Neurological:     General: No focal deficit present.     Mental Status: She is alert and oriented to person, place, and time. Mental status is at baseline.     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 She also has a history of esophageal varices status post prior banding procedures. She presents for elective TIPS creation. EXAM: 1. Ultrasound-guided paracentesis 2. TIPS creation utilizing intracardiac echocardiography (ICE) as intravascular ultrasound guidance MEDICATIONS: In patient already receiving IV Rocephin . No additional prophylaxis administered. ANESTHESIA/SEDATION: General - as administered by the Anesthesia department CONTRAST:  100 mL Omnipaque  300 FLUOROSCOPY TIME:  Radiation exposure index: 746 mGy reference air kerma COMPLICATIONS: None immediate. PROCEDURE: Informed written consent was obtained from the patient after a thorough discussion of the procedural risks, benefits and alternatives. All questions were addressed. Maximal Sterile Barrier Technique was utilized including caps, mask, sterile gowns, sterile gloves, sterile drape, hand hygiene and skin antiseptic. A timeout was performed prior to the initiation of the procedure. The right upper quadrant was interrogated  with ultrasound. There is a relatively small volume ascites in the perihepatic station. A suitable skin entry site was selected. A smaller the was made. Under real-time ultrasound guidance, a 6 French Safe-T-Centesis catheter was advanced into the perihepatic fluid collection. Paracentesis was then performed yielding approximately 1.5 L of golden the ascites. The right common femoral vein was interrogated with ultrasound and found to be widely patent. An image was obtained and stored for the medical record. Local anesthesia was attained by infiltration with 1% lidocaine . A small dermatotomy was made. Under real-time sonographic guidance, the vessel was punctured with a 21 gauge micropuncture needle. Using standard technique, the initial micro needle was exchanged over a 0.018 micro wire for a transitional 4 [place] micro sheath. The micro sheath  was then exchanged over a 0.035 wire for 8 French vascular sheath which was advanced into the inferior vena cava. The 8 French intracardiac echocardiography catheter was then advanced through the sheath and positioned in the inferior vena cava within the hepatic segment. Intravascular ultrasound was then used to identify the portal venous anatomy. The right internal jugular vein was interrogated with ultrasound and found to be widely patent. An image was obtained and stored for the medical record. Local anesthesia was attained by infiltration with 1% lidocaine . A small dermatotomy was made. Under real-time sonographic guidance, the vessel was punctured with a 21 gauge micropuncture needle. Using standard technique, the initial micro needle was exchanged over a 0.018 micro wire for a transitional 4 [place] micro sheath. The micro sheath was then exchanged over a 0.035 wire for a 10 French dilator in the percutaneous tract was dilated. A 10 French cook flexor sheath was then advanced over the wire and into the inferior vena cava. An angled MPA catheter was advanced through the sheath and used to select several hepatic veins. Hepatic venography was performed. Of the middle and right hepatic veins were catheterized and venography performed. Initially, the catheter was parked in the right hepatic vein and an Amplatz wire advanced. The catheter was exchanged in the tips sheath advanced over the wire. The scorpion set was then advanced over the wire. A single initial pass was attempted, however the angle was suboptimal in the portal vein could not be accessed. The scorpion set was removed in the catheter was used to select the middle hepatic vein. The Amplatz wire was advanced and parked. The 10 French flexor sheath was advanced into the vein followed by the scorpion set. This time, on a single pass the scorpion device was successfully advanced into the portal vein. Contrast injection confirms puncture of the central left  portal vein. The Glidewire Advantage was advanced into the splenic vein. The scorpion set was exchanged for a marker pigtail catheter. A portal venogram was performed. Confirmation of puncture of the central left portal system. Several small collaterals are evident including the left gastric and posterior gastric veins. Pressures were measured. The initial portal venous pressure was 32 mm of Hg and the right atrial pressure was 2 mm of Hg yielding a portosystemic gradient of 30 mmHg. The Amplatz wire was advanced into the splenic vein. The pigtail catheter was removed. The transhepatic tract was dilated to 6 mm using a 6 x 40 mm [place] balloon. The 10 French sheath was then advanced over the balloon as it was deflated. A 6 cm covered Viatorr stent graft was selected. The stent graft was advanced over the wire and into the main portal vein. The stent was unsheathed. The non covered  portion was allowed to open fully and was pulled back snug against the entry point into the left portal vein. The remainder of the stent was then deployed. The stent was post dilated to 8 mm using an 8 x 80 mm Athletis balloon. The pigtail catheter was reintroduced over the wire. Follow-up portal venography demonstrates a well-positioned and widely patent right middle to left portal TIPS. No further filling of venous collaterals. Pressures were again measured. Post tips portal venous pressure was 12 mm of Hg and the right atrial pressure was 5 mm of Hg consistent with a portosystemic gradient of 7 mm Hg. The pigtail catheter was removed over a wire. The right IJ sheath was removed and hemostasis attained by manual pressure. The ice catheter was removed as well as the right common femoral sheath. Hemostasis was attained by manual compression. IMPRESSION: 1. Successful paracentesis yielding 1.5 L. 2. Successful 8 mm TIPS creation. 3. Initial portosystemic gradient was 30 mm of Hg which corrected to 7 mm of Hg following TIPS creation. Patient  will be admitted to the hospitalist service for overnight observation. Outpatient follow-up with a TIPS ultrasound in 1 month. Electronically Signed   By: Fernando Hoyer M.D.   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 She also has a history of esophageal varices status post prior banding procedures. She presents for elective TIPS creation. EXAM: 1. Ultrasound-guided paracentesis 2. TIPS creation utilizing intracardiac echocardiography (ICE) as intravascular ultrasound guidance MEDICATIONS: In patient already receiving IV Rocephin . No additional prophylaxis administered. ANESTHESIA/SEDATION: General - as administered by the Anesthesia department CONTRAST:  100 mL Omnipaque  300 FLUOROSCOPY TIME:  Radiation exposure index: 746 mGy reference air kerma COMPLICATIONS: None immediate. PROCEDURE: Informed written consent was obtained from the patient after a thorough discussion of the procedural risks, benefits and alternatives. All questions were addressed. Maximal Sterile Barrier Technique was utilized including caps, mask, sterile gowns, sterile gloves, sterile drape, hand hygiene and skin antiseptic. A timeout was performed prior to the initiation of the procedure. The right upper quadrant was interrogated with ultrasound. There is a relatively small volume ascites in the perihepatic station. A suitable skin entry site was selected. A smaller the was made. Under real-time ultrasound guidance, a 6 French Safe-T-Centesis catheter was advanced into the perihepatic fluid collection. Paracentesis was then performed yielding approximately 1.5 L of golden the ascites. The right common femoral vein was interrogated with ultrasound and found to be widely patent. An image was obtained and stored for the medical record. Local anesthesia was attained by infiltration with  1% lidocaine . A small dermatotomy was made. Under real-time sonographic guidance, the vessel was punctured with a 21 gauge micropuncture needle. Using standard technique, the initial micro needle was exchanged over a 0.018 micro wire for a transitional 4 [place] micro sheath. The micro sheath was then exchanged over a 0.035 wire for 8 French vascular sheath which was advanced into the inferior vena cava. The 8 French intracardiac echocardiography catheter was then advanced through the sheath and positioned in the inferior vena cava within the hepatic segment. Intravascular ultrasound was then used to identify the portal venous anatomy. The right internal jugular vein was interrogated with ultrasound and found to be widely patent. An image was obtained and stored for the medical record. Local anesthesia was attained by infiltration with 1% lidocaine . A small dermatotomy was made. Under real-time sonographic guidance, the  vessel was punctured with a 21 gauge micropuncture needle. Using standard technique, the initial micro needle was exchanged over a 0.018 micro wire for a transitional 4 [place] micro sheath. The micro sheath was then exchanged over a 0.035 wire for a 10 French dilator in the percutaneous tract was dilated. A 10 French cook flexor sheath was then advanced over the wire and into the inferior vena cava. An angled MPA catheter was advanced through the sheath and used to select several hepatic veins. Hepatic venography was performed. Of the middle and right hepatic veins were catheterized and venography performed. Initially, the catheter was parked in the right hepatic vein and an Amplatz wire advanced. The catheter was exchanged in the tips sheath advanced over the wire. The scorpion set was then advanced over the wire. A single initial pass was attempted, however the angle was suboptimal in the portal vein could not be accessed. The scorpion set was removed in the catheter was used to select the middle  hepatic vein. The Amplatz wire was advanced and parked. The 10 French flexor sheath was advanced into the vein followed by the scorpion set. This time, on a single pass the scorpion device was successfully advanced into the portal vein. Contrast injection confirms puncture of the central left portal vein. The Glidewire Advantage was advanced into the splenic vein. The scorpion set was exchanged for a marker pigtail catheter. A portal venogram was performed. Confirmation of puncture of the central left portal system. Several small collaterals are evident including the left gastric and posterior gastric veins. Pressures were measured. The initial portal venous pressure was 32 mm of Hg and the right atrial pressure was 2 mm of Hg yielding a portosystemic gradient of 30 mmHg. The Amplatz wire was advanced into the splenic vein. The pigtail catheter was removed. The transhepatic tract was dilated to 6 mm using a 6 x 40 mm [place] balloon. The 10 French sheath was then advanced over the balloon as it was deflated. A 6 cm covered Viatorr stent graft was selected. The stent graft was advanced over the wire and into the main portal vein. The stent was unsheathed. The non covered portion was allowed to open fully and was pulled back snug against the entry point into the left portal vein. The remainder of the stent was then deployed. The stent was post dilated to 8 mm using an 8 x 80 mm Athletis balloon. The pigtail catheter was reintroduced over the wire. Follow-up portal venography demonstrates a well-positioned and widely patent right middle to left portal TIPS. No further filling of venous collaterals. Pressures were again measured. Post tips portal venous pressure was 12 mm of Hg and the right atrial pressure was 5 mm of Hg consistent with a portosystemic gradient of 7 mm Hg. The pigtail catheter was removed over a wire. The right IJ sheath was removed and hemostasis attained by manual pressure. The ice catheter was  removed as well as the right common femoral sheath. Hemostasis was attained by manual compression. IMPRESSION: 1. Successful paracentesis yielding 1.5 L. 2. Successful 8 mm TIPS creation. 3. Initial portosystemic gradient was 30 mm of Hg which corrected to 7 mm of Hg following TIPS creation. Patient will be admitted to the hospitalist service for overnight observation. Outpatient follow-up with a TIPS ultrasound in 1 month. Electronically Signed   By: Fernando Hoyer M.D.   On: 06/09/2023 16:39    Labs:  CBC: Recent Labs    06/06/23 0432 06/08/23 1153 06/09/23 0500  06/10/23 0455  WBC 1.2* 1.7* 1.6* 3.7*  HGB 8.5* 9.2* 8.9* 10.7*  HCT 25.7* 27.9* 26.1* 31.0*  PLT 51* 58* 56* 78*    COAGS: Recent Labs    01/29/23 0438 05/21/23 1555 05/30/23 1809 06/09/23 0500 06/10/23 0455  INR 1.5* 1.3* 1.3* 1.4* 1.6*  APTT 37* 33 32  --   --     BMP: Recent Labs    06/07/23 0521 06/08/23 1153 06/09/23 0500 06/10/23 0455  NA 134* 134* 136 138  K 3.7 3.6 3.5 3.4*  CL 104 102 103 104  CO2 27 29 28 24   GLUCOSE 113* 160* 94 144*  BUN 17 18 15 19   CALCIUM 8.3* 8.6* 8.4* 8.7*  CREATININE 1.09* 1.03* 0.86 0.84  GFRNONAA 51* 54* >60 >60    LIVER FUNCTION TESTS: Recent Labs    06/04/23 1730 06/05/23 0407 06/09/23 0500 06/10/23 0455  BILITOT 1.1 0.7 0.7 1.6*  AST 37 30 33 84*  ALT 21 18 18  41  ALKPHOS 52 39 45 45  PROT 7.1 5.9* 6.1* 6.0*  ALBUMIN  3.3* 2.7* 3.1* 2.9*    Assessment and Plan:  Day 1 post TIPS procedure and paracentesis - feels well aside from mild generalized abd tenderness and decreased appetite - abd very mildly distended on exam but ttp in all 4 quadrants - R neck and R groin insertion sites appear well, dressed appropriately and no overlying abnormality - Hospitalist ordered repeat US  paracentesis for tomorrow, will eval with US  to see if any remaining fluid. - Labs appear ok. Continue to trend tomorrow - Will place discharge orders for follow up in 1  month at clinic with repeat labs at that time per Dr. Marne Sings. - Concerning symptoms and return precautions discussed with patient and family at bedside   Electronically Signed: Nicolasa Barrett, PA-C 06/10/2023, 3:47 PM   I spent a total of 25 Minutes at the the patient's bedside AND on the patient's hospital floor or unit, greater than 50% of which was counseling/coordinating care for TIPS procedure follow up.

## 2023-06-10 NOTE — Progress Notes (Signed)
 The patient was out of the room most of yesterday and I spoke to her daughter about the patient's procedure and condition.  The patient underwent a TIPS procedure and a paracentesis.  The patient was started on lactulose  after the TIPS procedure.  Nothing further to do from a GI point of view at this time.  I will sign off.  Please call if any further GI concerns or questions.  We would like to thank you for the opportunity to participate in the care of Annette Foster.

## 2023-06-10 NOTE — Plan of Care (Signed)

## 2023-06-10 NOTE — Progress Notes (Signed)
 PHARMACIST - PHYSICIAN COMMUNICATION  DR:   Clelia Current  CONCERNING: IV to Oral Route Change Policy  RECOMMENDATION: This patient is receiving pantoprazole  by the intravenous route.  Based on criteria approved by the Pharmacy and Therapeutics Committee, the intravenous medication(s) is/are being converted to the equivalent oral dose form(s).   DESCRIPTION: These criteria include: The patient is eating (either orally or via tube) and/or has been taking other orally administered medications for a least 24 hours The patient has no evidence of active gastrointestinal bleeding or impaired GI absorption (gastrectomy, short bowel, patient on TNA or NPO).  If you have questions about this conversion, please contact the Pharmacy Department   Ramonita Burow, [facility] 06/10/2023 11:53 AM

## 2023-06-10 NOTE — Progress Notes (Signed)
 Progress Note   Patient: Annette Foster YQI:[id number] DOB: 15-Aug-1940 DOA: 06/04/2023     5 DOS: the patient was seen and examined on 06/10/2023   Brief hospital course: 83 y.o. 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  She last underwent paracentesis twice in the past 2 weeks on 4/10 and 4/20 with removal of 4.8 And 4.2 L respectively but has had rapid reaccumulation of fluid in spite of compliance with medication.  Patient states that her oncologist referred her to GI for consideration of Pleurx drain but appointment is far away. ED course and data review: Soft BP at 114/55 with otherwise normal vitals Labs with baseline pancytopenia, WBC 2.3, hemoglobin 10.3 and platelets 81.  Creatinine 1.05 from baseline of of 0.82 Lipase and LFTs WNL EKG, personally viewed and interpreted showing dual paced rhythm with occasional PVCs at rate of 74 No imaging done Hospitalist consulted for admission.  4/24.  Patient frustrated that she has recurrent ascites and ends up needing to come back to the hospital.  Paracentesis today removed 4.1 L.  GI made referral to interventional radiology for TIPS procedure for Monday.  Patient had quite a bit of pain after paracentesis. 4/25.  Patient feeling better today.  Not having any abdominal pain that she had last night.  Still need to replace potassium. 4/26.  Patient's abdomen is still very distended.  Looking forward to trying a TIPS procedure to see if this can help out with her recurrent ascites. 4/27.  When I walked in she had the nausea bag in front of her.  Did not eat breakfast.  I  ordered a dose of Phenergan  prior to lunch.  When she gets distended she can eat. 4/28.  Patient feels distended.  Patient had TIPS procedure and 1.5 L with paracentesis. 4/29.  Patient not feeling well today.  Having abdominal pain.  Will give empiric Rocephin .  Will order for another paracentesis for tomorrow and will send off cell counts.   Assessment and Plan: * Refractory ascites Paracentesis 4/24 removed 4.1 L of fluid.  Continue spironolactone  and Demadex .  TIPS procedure done 4/28 along with another paracentesis of 1.5 L.  Patient not feeling well and having abdominal pain.  Will give empiric Rocephin .  Will order for another paracentesis for tomorrow.  Abdominal pain Patient a tender today with palpation secondary to distention.  Patient also nauseous with her abdominal distention.  Will order for another paracentesis for tomorrow with cell counts.  Empiric Rocephin .  Hypokalemia Replace  Liver cirrhosis secondary to NASH [facility]) Patient with pancytopenia, mild coagulopathy with an INR of 1.6, refractory ascites, history of esophageal varices.  Continue nadolol .  TIPS procedure 4/28.  Patient also started on empiric lactulose  after TIPS procedure.  Pancytopenia (HCC) Secondary liver disease.  Blood counts better today.  White blood cell count up to 3.7, hemoglobin 10.7 and platelet count 78  Hypothyroidism, unspecified Continue levothyroxine   Chronic diastolic CHF (congestive heart failure) (HCC) Last EF 55%.  Torsemide  and nadolol   Hyperlipidemia, unspecified Continue Zocor   Hypotension On midodrine   Type 2 diabetes mellitus with complication, with long-term current use of insulin  (HCC) Last hemoglobin A1c back in January at 5.6.  Patient does not need 20  units of long-acting insulin .  Can potentially go home on 5 units.  Will just keep on sliding scale today.  AKI (acute kidney injury) (HCC) Cr was as low  as 0.7 on 4/25.  Creatinine has high as 1.09 on 4/26.  Cr 0.84  today. Watch with oral duiresis.        Subjective: Patient not feeling well at all.  Having abdominal pain.  Still with poor appetite.  Still feels distended.  1.5 L was taken off with paracentesis yesterday.  Physical Exam: Vitals:   06/09/23 2125 06/10/23 0501 06/10/23 0810 06/10/23 1513  BP: (!) 108/47 (!) 111/52 (!) 97/40 (!) 112/46  Pulse: 65 70 69 66  Resp: 16 20 16 16   Temp: (!) 97.5 F (36.4 C) 97.6 F (36.4 C) 97.6 F (36.4 C) 97.6 F (36.4 C)  TempSrc: Oral Oral Oral   SpO2: 97% 97% 100% 100%  Weight:      Height:       Physical Exam HENT:     Head: Normocephalic.  Eyes:     General: Lids are normal.     Conjunctiva/sclera: Conjunctivae normal.  Cardiovascular:     Rate and Rhythm: Normal rate and regular rhythm.     Heart sounds: Normal heart sounds, S1 normal and S2 normal.  Pulmonary:     Breath sounds: Examination of the right-lower field reveals decreased breath sounds. Examination of the left-lower field reveals decreased breath sounds. Decreased breath sounds present. No wheezing, rhonchi or rales.  Abdominal:     General: There is distension.     Palpations: Abdomen is soft.     Tenderness: There is generalized abdominal tenderness.  Musculoskeletal:     Right lower leg: No swelling.     Left lower leg: No swelling.  Skin:    General: Skin is warm.     Findings: No rash.  Neurological:     Mental Status: She is alert and oriented to person, place, and time.     Data Reviewed: Creatinine 0.84, potassium 3.4, total bilirubin 1.6, AST 84, ALT white blood count 3.7, hemoglobin 10.7, platelet count 78  Family Communication: Daughter at bedside this afternoon  Disposition: Status is: Inpatient Remains inpatient appropriate because: Patient still feeling distended and not feeling well today.  Will order for another paracentesis for tomorrow to try to remove more fluid.  Planned Discharge Destination: Home health    Time spent: 28  minutes  Author: Verla Glaze, MD 06/10/2023 3:36 PM  For on call review [email or web address].

## 2023-06-10 NOTE — Plan of Care (Signed)
 Problem: Education: Goal: Ability to describe self-care measures that may prevent or decrease complications (Diabetes Survival Skills Education) will improve 06/10/2023 0615 by Rayleen Cal, RN Outcome: Progressing 06/10/2023 0419 by Rayleen Cal, RN Outcome: Progressing Goal: Individualized Educational Video(s) 06/10/2023 0615 by Rayleen Cal, RN Outcome: Progressing 06/10/2023 0419 by Rayleen Cal, RN Outcome: Progressing   Problem: Coping: Goal: Ability to adjust to condition or change in health will improve 06/10/2023 0615 by Rayleen Cal, RN Outcome: Progressing 06/10/2023 0419 by Rayleen Cal, RN Outcome: Progressing   Problem: Fluid Volume: Goal: Ability to maintain a balanced intake and output will improve 06/10/2023 0615 by Rayleen Cal, RN Outcome: Progressing 06/10/2023 0419 by Rayleen Cal, RN Outcome: Progressing   Problem: Health Behavior/Discharge Planning: Goal: Ability to identify and utilize available resources and services will improve 06/10/2023 0615 by Rayleen Cal, RN Outcome: Progressing 06/10/2023 0419 by Rayleen Cal, RN Outcome: Progressing Goal: Ability to manage health-related needs will improve 06/10/2023 0615 by Rayleen Cal, RN Outcome: Progressing 06/10/2023 0419 by Rayleen Cal, RN Outcome: Progressing   Problem: Metabolic: Goal: Ability to maintain appropriate glucose levels will improve 06/10/2023 0615 by Rayleen Cal, RN Outcome: Progressing 06/10/2023 0419 by Rayleen Cal, RN Outcome: Progressing   Problem: Nutritional: Goal: Maintenance of adequate nutrition will improve 06/10/2023 0615 by Rayleen Cal, RN Outcome: Progressing 06/10/2023 0419 by Rayleen Cal, RN Outcome: Progressing Goal: Progress toward achieving an optimal weight will improve 06/10/2023 0615 by Rayleen Cal, RN Outcome: Progressing 06/10/2023 0419 by Rayleen Cal, RN Outcome:  Progressing   Problem: Skin Integrity: Goal: Risk for impaired skin integrity will decrease 06/10/2023 0615 by Rayleen Cal, RN Outcome: Progressing 06/10/2023 0419 by Rayleen Cal, RN Outcome: Progressing   Problem: Tissue Perfusion: Goal: Adequacy of tissue perfusion will improve 06/10/2023 0615 by Rayleen Cal, RN Outcome: Progressing 06/10/2023 0419 by Rayleen Cal, RN Outcome: Progressing   Problem: Education: Goal: Knowledge of General Education information will improve Description: Including pain rating scale, medication(s)/side effects and non-pharmacologic comfort measures 06/10/2023 0615 by Rayleen Cal, RN Outcome: Progressing 06/10/2023 0419 by Rayleen Cal, RN Outcome: Progressing   Problem: Health Behavior/Discharge Planning: Goal: Ability to manage health-related needs will improve 06/10/2023 0615 by Rayleen Cal, RN Outcome: Progressing 06/10/2023 0419 by Rayleen Cal, RN Outcome: Progressing   Problem: Clinical Measurements: Goal: Ability to maintain clinical measurements within normal limits will improve 06/10/2023 0615 by Rayleen Cal, RN Outcome: Progressing 06/10/2023 0419 by Rayleen Cal, RN Outcome: Progressing Goal: Will remain free from infection 06/10/2023 0615 by Rayleen Cal, RN Outcome: Progressing 06/10/2023 0419 by Rayleen Cal, RN Outcome: Progressing Goal: Diagnostic test results will improve 06/10/2023 0615 by Rayleen Cal, RN Outcome: Progressing 06/10/2023 0419 by Rayleen Cal, RN Outcome: Progressing Goal: Respiratory complications will improve 06/10/2023 0615 by Rayleen Cal, RN Outcome: Progressing 06/10/2023 0419 by Rayleen Cal, RN Outcome: Progressing Goal: Cardiovascular complication will be avoided 06/10/2023 0615 by Rayleen Cal, RN Outcome: Progressing 06/10/2023 0419 by Rayleen Cal, RN Outcome: Progressing   Problem: Activity: Goal: Risk  for activity intolerance will decrease 06/10/2023 0615 by Rayleen Cal, RN Outcome: Progressing 06/10/2023 0419 by Rayleen Cal, RN Outcome: Progressing   Problem: Nutrition: Goal: Adequate nutrition will be maintained 06/10/2023 0615 by Rayleen Cal, RN Outcome: Progressing 06/10/2023 0419 by Rayleen Cal, RN Outcome: Progressing  Problem: Coping: Goal: Level of anxiety will decrease 06/10/2023 0615 by Rayleen Cal, RN Outcome: Progressing 06/10/2023 0419 by Rayleen Cal, RN Outcome: Progressing   Problem: Elimination: Goal: Will not experience complications related to bowel motility 06/10/2023 0615 by Rayleen Cal, RN Outcome: Progressing 06/10/2023 0419 by Rayleen Cal, RN Outcome: Progressing Goal: Will not experience complications related to urinary retention 06/10/2023 0615 by Rayleen Cal, RN Outcome: Progressing 06/10/2023 0419 by Rayleen Cal, RN Outcome: Progressing   Problem: Pain Managment: Goal: General experience of comfort will improve and/or be controlled 06/10/2023 0615 by Rayleen Cal, RN Outcome: Progressing 06/10/2023 0419 by Rayleen Cal, RN Outcome: Progressing   Problem: Safety: Goal: Ability to remain free from injury will improve 06/10/2023 0615 by Rayleen Cal, RN Outcome: Progressing 06/10/2023 0419 by Rayleen Cal, RN Outcome: Progressing   Problem: Skin Integrity: Goal: Risk for impaired skin integrity will decrease 06/10/2023 0615 by Rayleen Cal, RN Outcome: Progressing 06/10/2023 0419 by Rayleen Cal, RN Outcome: Progressing   Problem: Education: Goal: Understanding of CV disease, CV risk reduction, and recovery process will improve 06/10/2023 0615 by Rayleen Cal, RN Outcome: Progressing 06/10/2023 0419 by Rayleen Cal, RN Outcome: Progressing Goal: Individualized Educational Video(s) 06/10/2023 0615 by Rayleen Cal, RN Outcome:  Progressing 06/10/2023 0419 by Rayleen Cal, RN Outcome: Progressing   Problem: Activity: Goal: Ability to return to baseline activity level will improve 06/10/2023 0615 by Rayleen Cal, RN Outcome: Progressing 06/10/2023 0419 by Rayleen Cal, RN Outcome: Progressing   Problem: Cardiovascular: Goal: Ability to achieve and maintain adequate cardiovascular perfusion will improve 06/10/2023 0615 by Rayleen Cal, RN Outcome: Progressing 06/10/2023 0419 by Rayleen Cal, RN Outcome: Progressing Goal: Vascular access site(s) Level 0-1 will be maintained 06/10/2023 0615 by Rayleen Cal, RN Outcome: Progressing 06/10/2023 0419 by Rayleen Cal, RN Outcome: Progressing   Problem: Health Behavior/Discharge Planning: Goal: Ability to safely manage health-related needs after discharge will improve 06/10/2023 0615 by Rayleen Cal, RN Outcome: Progressing 06/10/2023 0419 by Rayleen Cal, RN Outcome: Progressing

## 2023-06-10 NOTE — Evaluation (Signed)
 Physical Therapy Evaluation Patient Details Name: Annette Foster MRN: [id number] DOB: 1940-12-11 Today's Date: 06/10/2023  History of Present Illness  Pt is an 83 yo female admitted for recurrent ascites. Pt underwent TIPs procedure and paracentesis. PMH of afib, HTN, CHF, pacemaker, DM, breast cancer, GERD, hypothyroidism, OSA on CPAP, nonalcoholic liver cirrhosis, with pancytopenia, portal hypertensive gastropathy, grade 2 esophageal varices s/p banding 05/13/2023.   Clinical Impression  Pt alert, agreeable to PT, though emotional during visit. Per pt at baseline she is independent for mobility and ADLs, daughter assists with IADLs. She was able to perform bed mobility modI, reported 8/10 abdominal pain in sitting and end of session, RN notified. She was able to stand and ambulate with RW, CGA. Distance limited due to pain, but pt able to complete without physical assistance. Pt up in chair with needs in reach at end of session.  Overall the patient demonstrated deficits (see "PT Problem List") that impede the patient's functional abilities, safety, and mobility and would benefit from skilled PT intervention.          If plan is discharge home, recommend the following: A little help with walking and/or transfers;A little help with bathing/dressing/bathroom;Assistance with cooking/housework;Assist for transportation;Help with stairs or ramp for entrance   Can travel by private vehicle        Equipment Recommendations Rolling walker (2 wheels)  Recommendations for Other Services       Functional Status Assessment Patient has had a recent decline in their functional status and demonstrates the ability to make significant improvements in function in a reasonable and predictable amount of time.     Precautions / Restrictions Precautions Precautions: Fall Recall of Precautions/Restrictions: Intact Restrictions Weight Bearing Restrictions Per Provider Order: No      Mobility  Bed  Mobility Overal bed mobility: Modified Independent                  Transfers Overall transfer level: Needs assistance Equipment used: Rolling walker (2 wheels) Transfers: Sit to/from Stand Sit to Stand: Contact guard assist           General transfer comment: effortful due to pain    Ambulation/Gait Ambulation/Gait assistance: Contact guard assist Gait Distance (Feet): 25 Feet Assistive device: Rolling walker (2 wheels)   Gait velocity: decreased        Stairs            Wheelchair Mobility     Tilt Bed    Modified Rankin (Stroke Patients Only)       Balance Overall balance assessment: Needs assistance Sitting-balance support: Feet supported Sitting balance-Leahy Scale: Fair     Standing balance support: Bilateral upper extremity supported, During functional activity Standing balance-Leahy Scale: Fair                               Pertinent Vitals/Pain Pain Assessment Pain Assessment: 0-10 Pain Score: 8  Pain Location: abdomen Pain Descriptors / Indicators: Aching, Grimacing, Headache Pain Intervention(s): Limited activity within patient's tolerance, Monitored during session, Repositioned    Home Living Family/patient expects to be discharged to:: Private residence Living Arrangements: Alone Available Help at Discharge: Family Type of Home: House Home Access: Stairs to enter Entrance Stairs-Rails: Left Entrance Stairs-Number of Steps: 1   Home Layout: One level Home Equipment: None      Prior Function               Mobility Comments:  independent with mobility ADLs Comments: daughter assists with IADLs     Extremity/Trunk Assessment   Upper Extremity Assessment Upper Extremity Assessment: Generalized weakness    Lower Extremity Assessment Lower Extremity Assessment: Generalized weakness       Communication        Cognition Arousal: Alert Behavior During Therapy: WFL for tasks assessed/performed    PT - Cognitive impairments: No apparent impairments                         Following commands: Intact       Cueing       General Comments      Exercises     Assessment/Plan    PT Assessment Patient needs continued PT services  PT Problem List Decreased strength;Decreased range of motion;Decreased activity tolerance;Decreased balance;Decreased mobility;Decreased knowledge of precautions       PT Treatment Interventions DME instruction;Balance training;Gait training;Neuromuscular re-education;Stair training;Functional mobility training;Patient/family education;Therapeutic activities;Therapeutic exercise    PT Goals (Current goals can be found in the Care Plan section)  Acute Rehab PT Goals Patient Stated Goal: to feel better PT Goal Formulation: With patient Time For Goal Achievement: 06/24/23 Potential to Achieve Goals: Good    Frequency Min 2X/week     Co-evaluation               AM-PAC PT "6 Clicks" Mobility  Outcome Measure Help needed turning from your back to your side while in a flat bed without using bedrails?: None Help needed moving from lying on your back to sitting on the side of a flat bed without using bedrails?: None Help needed moving to and from a bed to a chair (including a wheelchair)?: None Help needed standing up from a chair using your arms (e.g., wheelchair or bedside chair)?: A Little Help needed to walk in hospital room?: A Little Help needed climbing 3-5 steps with a railing? : A Little 6 Click Score: 21    End of Session Equipment Utilized During Treatment: Gait belt Activity Tolerance: Patient tolerated treatment well;Patient limited by pain Patient left: in chair;with call bell/phone within reach;with chair alarm set Nurse Communication: Mobility status PT Visit Diagnosis: Other abnormalities of gait and mobility (R26.89);Difficulty in walking, not elsewhere classified (R26.2);Muscle weakness (generalized) (M62.81)     Time: 1610-9604 PT Time Calculation (min) (ACUTE ONLY): 18 min   Charges:   PT Evaluation $PT Eval Low Complexity: 1 Low PT Treatments $Therapeutic Activity: 8-22 mins PT General Charges $$ ACUTE PT VISIT: 1 Visit        Darien Eden PT, DPT 1:17 PM,06/10/23

## 2023-06-11 ENCOUNTER — Inpatient Hospital Stay

## 2023-06-11 DIAGNOSIS — R188 Other ascites: Secondary | ICD-10-CM | POA: Diagnosis not present

## 2023-06-11 LAB — MAGNESIUM: Magnesium: 1.9 mg/dL (ref 1.7–2.4)

## 2023-06-11 LAB — BASIC METABOLIC PANEL WITH GFR
Anion gap: 10 (ref 5–15)
BUN: 18 mg/dL (ref 8–23)
CO2: 26 mmol/L (ref 22–32)
Calcium: 8.3 mg/dL — ABNORMAL LOW (ref 8.9–10.3)
Chloride: 103 mmol/L (ref 98–111)
Creatinine, Ser: 0.64 mg/dL (ref 0.44–1.00)
GFR, Estimated: >60 mL/min (ref >60)
Glucose, Bld: 102 mg/dL — ABNORMAL HIGH (ref 70–99)
Potassium: 3.4 mmol/L — ABNORMAL LOW (ref 3.5–5.1)
Sodium: 139 mmol/L (ref 135–145)

## 2023-06-11 LAB — GLUCOSE, CAPILLARY
Glucose-Capillary: 96 mg/dL (ref 70–99)
Glucose-Capillary: 157 mg/dL — ABNORMAL HIGH (ref 70–99)
Glucose-Capillary: 150 mg/dL — ABNORMAL HIGH (ref 70–99)
Glucose-Capillary: 151 mg/dL — ABNORMAL HIGH (ref 70–99)

## 2023-06-11 MED ORDER — POTASSIUM CHLORIDE CRYS ER 20 MEQ PO TBCR
40.0000 meq | EXTENDED_RELEASE_TABLET | Freq: Once | ORAL | Status: AC
  Administered 2023-06-11: 40 meq via ORAL
  Filled 2023-06-11: qty 2

## 2023-06-11 NOTE — TOC Initial Note (Signed)
 Transition of Care [facility]) - Initial/Assessment Note    Patient Details  Name: Annette Foster MRN: [id number] Date of Birth: [birth year]  [facility] CM/SW Contact:    Annette Risk, RN Phone Number: 06/11/2023, 12:53 PM  Clinical Narrative:                  Admitted [id number] ascites  Admitted from: home alone PCP: tate Current home health/prior home health/DME: NA  Therapy recommending HH.  Patient in agreement and states that she does not have a preference of agency.  Referral made and accepted by [facility] with Annette Foster.  Referral made to Annette Foster with [employer] for [facility] and RW to be delivered to room.  Patient states that daughter will transport at discharge          Patient Goals and CMS Choice            Expected Discharge Plan and Services                                              Prior Living Arrangements/Services                       Activities of Daily Living   ADL Screening (condition at time of admission) Independently performs ADLs?: Yes (appropriate for developmental age) Does the patient have a NEW difficulty with bathing/dressing/toileting/self-feeding that is expected to last >3 days?: No Does the patient have a NEW difficulty with getting in/out of bed, walking, or climbing stairs that is expected to last >3 days?: No Does the patient have a NEW difficulty with communication that is expected to last >3 days?: No Is the patient deaf or have difficulty hearing?: No Does the patient have difficulty seeing, even when wearing glasses/contacts?: No Does the patient have difficulty concentrating, remembering, or making decisions?: No  Permission Sought/Granted                  Emotional Assessment              Admission diagnosis:  Other ascites [R18.8] Ascites [R18.8] Patient Active Problem List   Diagnosis Date Noted   Liver cirrhosis secondary to NASH (HCC) 06/05/2023   Hypotension 06/05/2023    Refractory ascites 05/30/2023   Hyperlipidemia, unspecified 05/30/2023   Diabetes mellitus without complication (HCC) 05/30/2023   Overweight (BMI 25.0-29.9) 05/30/2023   Anxiety 05/30/2023   Decompensated nonalcoholic liver cirrhosis (HCC) 05/21/2023   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 05/21/2023   OSA on CPAP 05/21/2023   IDA (iron  deficiency anemia) 05/21/2023   Abdominal pain 03/03/2023   Electrolyte abnormality 03/03/2023   ABLA (acute blood loss anemia) 03/02/2023   Non-alcoholic fatty liver disease 01/30/2023   Sepsis due to pneumonia (HCC) 01/29/2023   GERD without esophagitis 01/29/2023   Dyslipidemia 01/29/2023   Type 2 diabetes mellitus with complication, with long-term current use of insulin  (HCC) 01/29/2023   Hypokalemia 01/29/2023   Hyponatremia 01/29/2023   GI bleeding 01/28/2023   H/O atrioventricular nodal ablation 11/13/2022   Esophageal varices without bleeding (HCC) 10/17/2022   Pneumonia 09/11/2022   Adenomatous polyp of colon 09/10/2022   Rectal bleeding 09/08/2022   Leg pain 07/18/2022   Varicose veins with inflammation 07/17/2022   Unspecified injury of head, initial encounter 09/14/2021   Rectal lesion    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) 08/22/2021   Persistent atrial fibrillation (HCC) 05/17/2021   Pacemaker 03/23/2021   Portal hypertension (HCC)    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 bleeding (HCC)    Chronic diastolic CHF (congestive heart failure) (HCC) 04/05/2020   Hepatic cirrhosis (HCC) 04/05/2020   Abnormal EKG 04/05/2020   Allergic rhinitis due to pollen 02/24/2020   Chronic allergic conjunctivitis 02/24/2020   Allergic rhinitis due to animal (cat) (dog) hair and dander 02/24/2020   Allergic rhinitis 02/24/2020   Polyp of transverse colon    Hematochezia 02/17/2020   Chronic anticoagulation 02/17/2020   Anasarca 02/17/2020   Symptomatic sinus bradycardia 02/17/2020   Paroxysmal atrial  fibrillation (HCC) 02/17/2020   AKI (acute kidney injury) (HCC) 02/17/2020   History of colitis 02/17/2020   Acute GI bleeding    Atrial fibrillation with RVR (HCC) 02/07/2020   NASH (nonalcoholic steatohepatitis)    Hav (hallux abducto valgus), unspecified laterality 06/03/2019   Acquired bilateral hammer toes 06/03/2019   Iron  deficiency 04/26/2019   Right-sided chest wall pain 04/12/2019   Mid back pain on right side 04/12/2019   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 11/03/2017   Recurrent UTI (urinary tract infection) 07/23/2017   Nausea vomiting and diarrhea 01/11/2017   Hypothyroidism, unspecified 01/11/2017   HTN (hypertension) 01/11/2017   GERD (gastroesophageal reflux disease) 01/11/2017   Pancytopenia (HCC) 11/14/2016   Monilial vulvitis 11/06/2016   Spongiotic psoriasiform dermatitis 10/08/2016   Status post hysterectomy 06/20/2016   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 [place] [address] Phone: [phone] Fax: [phone]     Social Drivers of Health (SDOH) Social History: SDOH Screenings   Food Insecurity: No Food Insecurity (06/06/2023)  Housing: Low Foster  (06/06/2023)  Transportation Needs: No Transportation Needs (06/06/2023)  Utilities: Not At Foster (06/06/2023)  Financial Resource Strain: Medium Foster (05/02/2023)   Received from [facility]  Physical Activity: Unknown (01/30/2018)   Received from [facility]  Social Connections: Moderately Integrated (06/06/2023)  Tobacco Use: Low Foster  (06/09/2023)   SDOH Interventions:     Readmission Foster Interventions     No data to display

## 2023-06-11 NOTE — Plan of Care (Signed)

## 2023-06-11 NOTE — Progress Notes (Signed)
 Progress Note   Patient: Annette Foster IHK:[id number] DOB: 28-Feb-1940 DOA: 06/04/2023     6 DOS: the patient was seen and examined on 06/11/2023   Brief hospital course:  "83 y.o. 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  She last underwent paracentesis twice in the past 2 weeks on 4/10 and 4/20 with removal of 4.8 And 4.2 L respectively but has had rapid reaccumulation of fluid in spite of compliance with medication.  Patient Foster that her oncologist referred her to GI for consideration of Pleurx drain but appointment is far away. ED course and data review: Soft BP at 114/55 with otherwise normal vitals Labs with baseline pancytopenia, WBC 2.3, hemoglobin 10.3 and platelets 81.  Creatinine 1.05 from baseline of of 0.82 Lipase and LFTs WNL EKG, personally viewed and interpreted showing dual paced rhythm with occasional PVCs at rate of 74 No imaging done Hospitalist consulted for admission.   4/24.  Patient frustrated that she has recurrent ascites and ends up needing to come back to the hospital.  Paracentesis today removed 4.1 L.  GI made referral to interventional radiology for TIPS procedure for Monday.  Patient had quite a bit of pain after paracentesis. 4/25.  Patient feeling better today.  Not having any abdominal pain that she had last night.  Still need to replace potassium. 4/26.  Patient's abdomen is still very distended.  Looking forward to trying a TIPS procedure to see if this can help out with her recurrent ascites. 4/27.  When I walked in she had the nausea bag in front of her.  Did not eat breakfast.  I  ordered a dose of Phenergan  prior to lunch.  When she gets distended she can eat. 4/28.  Patient feels distended.  Patient had TIPS procedure and 1.5 L with paracentesis. 4/29.  Patient not feeling well today.  Having abdominal pain.  Will give empiric Rocephin .  Will order for another paracentesis for tomorrow and will send off cell counts."   I assumed care on 06/11/23.  4/30 -- U/S today with IR showed no significant ascites.  K 3.4.   Assessment and Plan:  Refractory ascites   Status post TIPS on 06/09/23 with Dr. Marne Sings Paracentesis 4/24 removed 4.1 L.   Paracentesis 4/28 removed 1.5 L Cell counts were negative for SBP --Continue spironolactone  and Demadex .   --Continue empiric Rocephin  --Monitor abdominal exam for signs of SBP or ascites recurrence --Outpatient IR follow up in 1 month  Abdominal pain Improved, minimal tenderness on exam today.  Non-distended. --On empiric Rocephin .  Hypokalemia - Replaced K 3.4 again today --40 mEq Kcl to replace --Repeat BMP in AM  Liver cirrhosis secondary to NASH  Patient with pancytopenia, mild coagulopathy with an INR of 1.6, refractory ascites, history of esophageal varices.  TIPS procedure 4/28. --Continue nadolol  --Continue empiric lactulose   Pancytopenia Secondary liver disease.   --Transfuse as needed for Hbg < 7 or platelets < 50k +bleeding or <10k w/o bleeding --Monitor CBC  Hypothyroidism, unspecified --Continue levothyroxine   Chronic diastolic CHF (congestive heart failure) (HCC) Last EF 55%.   --Torsemide  and nadolol   Hyperlipidemia, unspecified --Continue Zocor   Hypotension --On midodrine   Type 2 diabetes mellitus with complication, with long-term current  use of insulin  - Last hemoglobin A1c back in January at 5.6.   Patient does not need 20 units of long-acting insulin .   Can potentially go home on 5 units.   --Sliding scale Novolog  for now  AKI -resolved Cr was as low  as 0.7 on 4/25.  Creatinine  has high as 1.09 on 4/26.   --Monitor BMP      Subjective: Patient not feeling well at all.  Having abdominal pain.  Still with poor appetite.  Still feels distended.  1.5 L was taken off with paracentesis yesterday.  Physical Exam: Vitals:   06/10/23 2028 06/11/23 0355 06/11/23 0752 06/11/23 1531  BP: (!) 103/43 (!) 105/48 (!) 101/51 (!) 115/40  Pulse: 66 69 68 66  Resp: 18 18 16 16   Temp: 98 F (36.7 C) (!) 97.5 F (36.4 C) 97.8 F (36.6 C) 97.7 F (36.5 C)  TempSrc: Oral Oral Oral Oral  SpO2: 100% 100% 100% 100%  Weight:      Height:       General exam: awake, alert, no acute distress HEENT: moist mucus membranes, hearing grossly normal  Respiratory system: CTA, no wheezes, rales or rhonchi, normal respiratory effort. Cardiovascular system: normal S1/S2, RRR, no pedal edema.   Gastrointestinal system: soft, NT, ND, no HSM felt, +bowel sounds. Central nervous system: A&O x 3. no gross focal neurologic deficits, normal speech Extremities: moves all, no edema, normal tone Skin: dry, intact, normal temperature, normal color, No rashes, lesions or ulcers Psychiatry: normal mood, congruent affect, judgement and insight appear normal     Data Reviewed: Notable labs --  K 3.4 Ca 8.3  Last CBC 4/29 - WBC 3.7, Hbg 10.7, platelets 78k  Family Communication: None present on rounds ,will attempt to call as time allows.  Disposition: Status is: Inpatient Remains inpatient appropriate because: Patient still feeling distended and not feeling well today.  Will order for another paracentesis for tomorrow to try to remove more fluid.  Planned Discharge Destination: Home health    Time spent: 40 minutes  Author: Montey Apa, DO 06/11/2023 3:53 PM  For on call review [email or web address].

## 2023-06-11 NOTE — Progress Notes (Signed)
 Referring Physician(s): Dr. Marnee Sink   Supervising Physician: Fernando Hoyer  Patient Status:  [facility] - In-pt  Chief Complaint:  S/p TIPS procedure 06/09/23  Subjective:  Patient is day 2 post TIPS procedure. Inpatient medicine team had ordered a repeat US  paracentesis for today as pateient was having some abdominal pain ysetertday. On arrival to US , there was no drainable fluid visible on US . The US  team saved these images to the encounter.   Pt reports her abdominal pain has improved today and she has been able to tolerate PO intake better than yesterday. No new concerns.  Allergies: Codeine, Demeclocycline, Demerol [meperidine], Hydrocodone, Morphine , Other, Oxycodone , Pentazocine, Tetracyclines & related, Coal tar extract, Hydrocodone-acetaminophen , Salicylic acid, and Tetracycline hcl  Medications: Prior to Admission medications   Medication Sig Start Date End Date Taking? Authorizing Provider  albuterol  (VENTOLIN  HFA) 108 (90 Base) MCG/ACT inhaler Inhale 2 puffs into the lungs every 4 (four) hours as needed for wheezing or shortness of breath. 04/30/23 04/29/24 Yes [provider]  Cholecalciferol  (VITAMIN D ) 50 MCG (2000 UT) CAPS Take 2,000 Units by mouth daily.   Yes [provider]  clobetasol  ointment (TEMOVATE ) 0.05 % Apply 1 application topically as needed (vaginal irritation).   Yes [provider]  clotrimazole  (LOTRIMIN ) 1 % cream Apply 1 Application topically 2 (two) times daily as needed.   Yes [provider]  cyanocobalamin (,VITAMIN B-12,) 1000 MCG/ML injection 1,000 mcg every 30 (thirty) days. 04/23/19  Yes [provider]  dicyclomine  (BENTYL ) 10 MG capsule Take 10 mg by mouth in the morning, at noon, and at bedtime. 05/16/19  Yes [provider]  docusate sodium  (COLACE) 100 MG capsule Take 100 mg by mouth 3 (three) times daily as needed for moderate constipation. 01/10/20  Yes [provider]   esomeprazole (NEXIUM) 40 MG capsule Take 40 mg by mouth 2 (two) times daily before a meal.  02/08/16  Yes [provider]  fenofibrate  160 MG tablet Take 160 mg by mouth at bedtime.   Yes [provider]  gabapentin  (NEURONTIN ) 300 MG capsule Take 300 mg by mouth 2 (two) times daily. 09/07/16 10/25/36 Yes Westley Hammers, MD  insulin  degludec (TRESIBA  FLEXTOUCH) 200 UNIT/ML FlexTouch Pen 200 Units at bedtime.   Yes [provider]  ipratropium (ATROVENT) 0.03 % nasal spray Place 1 spray into both nostrils 2 (two) times daily as needed for rhinitis. 04/15/23 04/14/24 Yes [provider]  levocetirizine (XYZAL) 5 MG tablet Take 5 mg by mouth every evening.   Yes [provider]  levothyroxine  (SYNTHROID , LEVOTHROID) 75 MCG tablet Take 75 mcg by mouth daily before breakfast.    Yes [provider]  magnesium  oxide (MAG-OX) 400 MG tablet Take 400 mg by mouth 2 (two) times daily.    Yes [provider]  midodrine  (PROAMATINE ) 5 MG tablet Take 1 tablet (5 mg total) by mouth 3 (three) times daily with meals. 05/31/23  Yes Luna Salinas, MD  MOUNJARO  15 MG/0.5ML Pen Inject 15 mg into the skin once a week. 03/18/23  Yes [provider]  nadolol  (CORGARD ) 20 MG tablet Take 1 tablet (20 mg total) by mouth daily. 12/20/22  Yes End, Veryl Gottron, MD  NOVOLOG  FLEXPEN 100 UNIT/ML FlexPen Inject 38 Units into the skin 3 (three) times daily with meals. 05/27/23  Yes [provider]  ondansetron  (ZOFRAN ) 8 MG tablet One pill every 8 hours as needed for nausea/vomitting. Patient taking differently: Take 8 mg by mouth  every 8 (eight) hours as needed for vomiting or nausea. One pill every 8 hours as needed for nausea/vomitting. 06/04/23  Yes Brahmanday, Govinda R, MD  saccharomyces boulardii (FLORASTOR) 250 MG capsule Take 250 mg by mouth 2 (two) times daily.    Yes [provider]  simvastatin  (ZOCOR ) 20 MG tablet Take 20 mg by mouth at bedtime.    Yes [provider]  spironolactone  (ALDACTONE ) 25 MG tablet Take 1 tablet (25 mg total) by mouth daily. 05/22/23  Yes Garrison Kanner, MD  torsemide  (DEMADEX ) 20 MG tablet Take 40 mg by mouth daily.   Yes [provider]  traMADol  (ULTRAM ) 50 MG tablet Take 1 tablet (50 mg total) by mouth every 8 (eight) hours as needed. Patient taking differently: Take 50 mg by mouth every 8 (eight) hours as needed for moderate pain (pain score 4-6). 06/04/23  Yes Brahmanday, Govinda R, MD  valACYclovir  (VALTREX ) 500 MG tablet Take 500 mg by mouth 2 (two) times daily as needed. 02/10/23  Yes [provider]     Vital Signs: BP (!) 101/51 (BP Location: Left Arm)   Pulse 68   Temp 97.8 F (36.6 C) (Oral)   Resp 16   Ht 5\' 3"  (1.6 m)   Wt 151 lb (68.5 kg)   LMP  (LMP Unknown)   SpO2 100%   BMI 26.75 kg/m   Physical Exam Vitals and nursing note reviewed.  Constitutional:      Appearance: Normal appearance.  HENT:     Mouth/Throat:     Mouth: Mucous membranes are moist.     Pharynx: Oropharynx is clear.  Cardiovascular:     Rate and Rhythm: Normal rate and regular rhythm.  Pulmonary:     Effort: Pulmonary effort is normal.     Breath sounds: Normal breath sounds.  Abdominal:     Palpations: Abdomen is soft.     Comments: Pt not having significant abd tenderness. Much improved from yesterdays exam.   Musculoskeletal:     Right lower leg: No edema.     Left lower leg: No edema.  Skin:    General: Skin is warm and dry.     Comments: Catheter insertion sites at R neck and R groin continue to appear well. Dressed appropriately and no surrounding pain swelling or erythema  Neurological:     Mental Status: She is alert and oriented to person, place, and time. Mental status is at baseline.     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 She  also has a history of esophageal varices status post prior banding procedures. She presents for elective TIPS creation. EXAM: 1. Ultrasound-guided paracentesis 2. TIPS creation utilizing intracardiac echocardiography (ICE) as intravascular ultrasound guidance MEDICATIONS: In patient already receiving IV Rocephin . No additional prophylaxis administered. ANESTHESIA/SEDATION: General - as administered by the Anesthesia department CONTRAST:  100 mL Omnipaque  300 FLUOROSCOPY TIME:  Radiation exposure index: 746 mGy reference air kerma COMPLICATIONS: None immediate. PROCEDURE: Informed written consent was obtained from the patient after a thorough discussion of the procedural risks, benefits and alternatives. All questions were addressed. Maximal Sterile Barrier Technique was utilized including caps, mask, sterile gowns, sterile gloves, sterile drape, hand hygiene and skin antiseptic. A timeout was performed prior to the initiation of the procedure. The right upper quadrant was interrogated with ultrasound. There is a relatively small volume ascites in the perihepatic station. A suitable skin  entry site was selected. A smaller the was made. Under real-time ultrasound guidance, a 6 French Safe-T-Centesis catheter was advanced into the perihepatic fluid collection. Paracentesis was then performed yielding approximately 1.5 L of golden the ascites. The right common femoral vein was interrogated with ultrasound and found to be widely patent. An image was obtained and stored for the medical record. Local anesthesia was attained by infiltration with 1% lidocaine . A small dermatotomy was made. Under real-time sonographic guidance, the vessel was punctured with a 21 gauge micropuncture needle. Using standard technique, the initial micro needle was exchanged over a 0.018 micro wire for a transitional 4 [place] micro sheath. The micro sheath was then exchanged over a 0.035 wire for 8 French vascular sheath which was advanced into  the inferior vena cava. The 8 French intracardiac echocardiography catheter was then advanced through the sheath and positioned in the inferior vena cava within the hepatic segment. Intravascular ultrasound was then used to identify the portal venous anatomy. The right internal jugular vein was interrogated with ultrasound and found to be widely patent. An image was obtained and stored for the medical record. Local anesthesia was attained by infiltration with 1% lidocaine . A small dermatotomy was made. Under real-time sonographic guidance, the vessel was punctured with a 21 gauge micropuncture needle. Using standard technique, the initial micro needle was exchanged over a 0.018 micro wire for a transitional 4 [place] micro sheath. The micro sheath was then exchanged over a 0.035 wire for a 10 French dilator in the percutaneous tract was dilated. A 10 French cook flexor sheath was then advanced over the wire and into the inferior vena cava. An angled MPA catheter was advanced through the sheath and used to select several hepatic veins. Hepatic venography was performed. Of the middle and right hepatic veins were catheterized and venography performed. Initially, the catheter was parked in the right hepatic vein and an Amplatz wire advanced. The catheter was exchanged in the tips sheath advanced over the wire. The scorpion set was then advanced over the wire. A single initial pass was attempted, however the angle was suboptimal in the portal vein could not be accessed. The scorpion set was removed in the catheter was used to select the middle hepatic vein. The Amplatz wire was advanced and parked. The 10 French flexor sheath was advanced into the vein followed by the scorpion set. This time, on a single pass the scorpion device was successfully advanced into the portal vein. Contrast injection confirms puncture of the central left portal vein. The Glidewire Advantage was advanced into the splenic vein. The scorpion set  was exchanged for a marker pigtail catheter. A portal venogram was performed. Confirmation of puncture of the central left portal system. Several small collaterals are evident including the left gastric and posterior gastric veins. Pressures were measured. The initial portal venous pressure was 32 mm of Hg and the right atrial pressure was 2 mm of Hg yielding a portosystemic gradient of 30 mmHg. The Amplatz wire was advanced into the splenic vein. The pigtail catheter was removed. The transhepatic tract was dilated to 6 mm using a 6 x 40 mm [place] balloon. The 10 French sheath was then advanced over the balloon as it was deflated. A 6 cm covered Viatorr stent graft was selected. The stent graft was advanced over the wire and into the main portal vein. The stent was unsheathed. The non covered portion was allowed to open fully and was pulled back snug against the entry point into  the left portal vein. The remainder of the stent was then deployed. The stent was post dilated to 8 mm using an 8 x 80 mm Athletis balloon. The pigtail catheter was reintroduced over the wire. Follow-up portal venography demonstrates a well-positioned and widely patent right middle to left portal TIPS. No further filling of venous collaterals. Pressures were again measured. Post tips portal venous pressure was 12 mm of Hg and the right atrial pressure was 5 mm of Hg consistent with a portosystemic gradient of 7 mm Hg. The pigtail catheter was removed over a wire. The right IJ sheath was removed and hemostasis attained by manual pressure. The ice catheter was removed as well as the right common femoral sheath. Hemostasis was attained by manual compression. IMPRESSION: 1. Successful paracentesis yielding 1.5 L. 2. Successful 8 mm TIPS creation. 3. Initial portosystemic gradient was 30 mm of Hg which corrected to 7 mm of Hg following TIPS creation. Patient will be admitted to the hospitalist service for overnight observation. Outpatient  follow-up with a TIPS ultrasound in 1 month. Electronically Signed   By: Fernando Hoyer M.D.   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 She also has a history of esophageal varices status post prior banding procedures. She presents for elective TIPS creation. EXAM: 1. Ultrasound-guided paracentesis 2. TIPS creation utilizing intracardiac echocardiography (ICE) as intravascular ultrasound guidance MEDICATIONS: In patient already receiving IV Rocephin . No additional prophylaxis administered. ANESTHESIA/SEDATION: General - as administered by the Anesthesia department CONTRAST:  100 mL Omnipaque  300 FLUOROSCOPY TIME:  Radiation exposure index: 746 mGy reference air kerma COMPLICATIONS: None immediate. PROCEDURE: Informed written consent was obtained from the patient after a thorough discussion of the procedural risks, benefits and alternatives. All questions were addressed. Maximal Sterile Barrier Technique was utilized including caps, mask, sterile gowns, sterile gloves, sterile drape, hand hygiene and skin antiseptic. A timeout was performed prior to the initiation of the procedure. The right upper quadrant was interrogated with ultrasound. There is a relatively small volume ascites in the perihepatic station. A suitable skin entry site was selected. A smaller the was made. Under real-time ultrasound guidance, a 6 French Safe-T-Centesis catheter was advanced into the perihepatic fluid collection. Paracentesis was then performed yielding approximately 1.5 L of golden the ascites. The right common femoral vein was interrogated with ultrasound and found to be widely patent. An image was obtained and stored for the medical record. Local anesthesia was attained by infiltration with 1% lidocaine . A small dermatotomy was made. Under real-time sonographic guidance,  the vessel was punctured with a 21 gauge micropuncture needle. Using standard technique, the initial micro needle was exchanged over a 0.018 micro wire for a transitional 4 [place] micro sheath. The micro sheath was then exchanged over a 0.035 wire for 8 French vascular sheath which was advanced into the inferior vena cava. The 8 French intracardiac echocardiography catheter was then advanced through the sheath and positioned in the inferior vena cava within the hepatic segment. Intravascular ultrasound was then used to identify the portal venous anatomy. The right internal jugular vein was interrogated with ultrasound and found to be widely patent. An image was obtained and stored for the medical record. Local anesthesia was attained by infiltration with 1% lidocaine . A small dermatotomy was made. Under real-time sonographic guidance, the vessel was punctured with a 21 gauge micropuncture needle. Using standard technique, the initial micro needle  was exchanged over a 0.018 micro wire for a transitional 4 [place] micro sheath. The micro sheath was then exchanged over a 0.035 wire for a 10 French dilator in the percutaneous tract was dilated. A 10 French cook flexor sheath was then advanced over the wire and into the inferior vena cava. An angled MPA catheter was advanced through the sheath and used to select several hepatic veins. Hepatic venography was performed. Of the middle and right hepatic veins were catheterized and venography performed. Initially, the catheter was parked in the right hepatic vein and an Amplatz wire advanced. The catheter was exchanged in the tips sheath advanced over the wire. The scorpion set was then advanced over the wire. A single initial pass was attempted, however the angle was suboptimal in the portal vein could not be accessed. The scorpion set was removed in the catheter was used to select the middle hepatic vein. The Amplatz wire was advanced and parked. The 10 French flexor sheath  was advanced into the vein followed by the scorpion set. This time, on a single pass the scorpion device was successfully advanced into the portal vein. Contrast injection confirms puncture of the central left portal vein. The Glidewire Advantage was advanced into the splenic vein. The scorpion set was exchanged for a marker pigtail catheter. A portal venogram was performed. Confirmation of puncture of the central left portal system. Several small collaterals are evident including the left gastric and posterior gastric veins. Pressures were measured. The initial portal venous pressure was 32 mm of Hg and the right atrial pressure was 2 mm of Hg yielding a portosystemic gradient of 30 mmHg. The Amplatz wire was advanced into the splenic vein. The pigtail catheter was removed. The transhepatic tract was dilated to 6 mm using a 6 x 40 mm [place] balloon. The 10 French sheath was then advanced over the balloon as it was deflated. A 6 cm covered Viatorr stent graft was selected. The stent graft was advanced over the wire and into the main portal vein. The stent was unsheathed. The non covered portion was allowed to open fully and was pulled back snug against the entry point into the left portal vein. The remainder of the stent was then deployed. The stent was post dilated to 8 mm using an 8 x 80 mm Athletis balloon. The pigtail catheter was reintroduced over the wire. Follow-up portal venography demonstrates a well-positioned and widely patent right middle to left portal TIPS. No further filling of venous collaterals. Pressures were again measured. Post tips portal venous pressure was 12 mm of Hg and the right atrial pressure was 5 mm of Hg consistent with a portosystemic gradient of 7 mm Hg. The pigtail catheter was removed over a wire. The right IJ sheath was removed and hemostasis attained by manual pressure. The ice catheter was removed as well as the right common femoral sheath. Hemostasis was attained by manual  compression. IMPRESSION: 1. Successful paracentesis yielding 1.5 L. 2. Successful 8 mm TIPS creation. 3. Initial portosystemic gradient was 30 mm of Hg which corrected to 7 mm of Hg following TIPS creation. Patient will be admitted to the hospitalist service for overnight observation. Outpatient follow-up with a TIPS ultrasound in 1 month. Electronically Signed   By: Fernando Hoyer M.D.   On: 06/09/2023 16:39    Labs:  CBC: Recent Labs    06/06/23 0432 06/08/23 1153 06/09/23 0500 06/10/23 0455  WBC 1.2* 1.7* 1.6* 3.7*  HGB 8.5* 9.2* 8.9* 10.7*  HCT  25.7* 27.9* 26.1* 31.0*  PLT 51* 58* 56* 78*    COAGS: Recent Labs    01/29/23 0438 05/21/23 1555 05/30/23 1809 06/09/23 0500 06/10/23 0455  INR 1.5* 1.3* 1.3* 1.4* 1.6*  APTT 37* 33 32  --   --     BMP: Recent Labs    06/08/23 1153 06/09/23 0500 06/10/23 0455 06/11/23 0513  NA 134* 136 138 139  K 3.6 3.5 3.4* 3.4*  CL 102 103 104 103  CO2 29 28 24 26   GLUCOSE 160* 94 144* 102*  BUN 18 15 19 18   CALCIUM 8.6* 8.4* 8.7* 8.3*  CREATININE 1.03* 0.86 0.84 0.64  GFRNONAA 54* >60 >60 >60    LIVER FUNCTION TESTS: Recent Labs    06/04/23 1730 06/05/23 0407 06/09/23 0500 06/10/23 0455  BILITOT 1.1 0.7 0.7 1.6*  AST 37 30 33 84*  ALT 21 18 18  41  ALKPHOS 52 39 45 45  PROT 7.1 5.9* 6.1* 6.0*  ALBUMIN  3.3* 2.7* 3.1* 2.9*    Assessment and Plan:  Day 2 post TIPS - abd pain improved from yesterday, tolerating PO intake better - US  paracentesis that was ordered by inpatient team negative for any fluid today - patient updated regarding IR plan going forward including follow up outpatient in 1 month for repeat imaging and labs, these orders have been placed already.  - reiterated need for taking her lactulose  as prescribed and went over concerning symptoms pt should look out for and all return precautions  Electronically Signed: Nicolasa Barrett, PA-C 06/11/2023, 10:07 AM   I spent a total of 15 Minutes at the the  patient's bedside AND on the patient's hospital floor or unit, greater than 50% of which was counseling/coordinating care for TIPS procedure follow up.

## 2023-06-11 NOTE — Plan of Care (Signed)

## 2023-06-11 NOTE — Progress Notes (Signed)
 Patient is not able to walk the distance required to go the bathroom, or he/she is unable to safely negotiate stairs required to access the bathroom.  A 3in1 BSC will alleviate this problem

## 2023-06-12 DIAGNOSIS — R188 Other ascites: Secondary | ICD-10-CM | POA: Diagnosis not present

## 2023-06-12 LAB — BASIC METABOLIC PANEL WITH GFR
Anion gap: 5 (ref 5–15)
BUN: 17 mg/dL (ref 8–23)
CO2: 29 mmol/L (ref 22–32)
Calcium: 7.8 mg/dL — ABNORMAL LOW (ref 8.9–10.3)
Chloride: 102 mmol/L (ref 98–111)
Creatinine, Ser: 0.8 mg/dL (ref 0.44–1.00)
GFR, Estimated: >60 mL/min (ref >60)
Glucose, Bld: 124 mg/dL — ABNORMAL HIGH (ref 70–99)
Potassium: 3.1 mmol/L — ABNORMAL LOW (ref 3.5–5.1)
Sodium: 136 mmol/L (ref 135–145)

## 2023-06-12 LAB — CBC
HCT: 29.1 % — ABNORMAL LOW (ref 36.0–46.0)
Hemoglobin: 10 g/dL — ABNORMAL LOW (ref 12.0–15.0)
MCH: 32.1 pg (ref 26.0–34.0)
MCHC: 34.4 g/dL (ref 30.0–36.0)
MCV: 93.3 fL (ref 80.0–100.0)
Platelets: 81 10*3/uL — ABNORMAL LOW (ref 150–400)
RBC: 3.12 MIL/uL — ABNORMAL LOW (ref 3.87–5.11)
RDW: 15.2 % (ref 11.5–15.5)
WBC: 5.4 10*3/uL (ref 4.0–10.5)
nRBC: 0 % (ref 0.0–0.2)

## 2023-06-12 LAB — GLUCOSE, CAPILLARY
Glucose-Capillary: 124 mg/dL — ABNORMAL HIGH (ref 70–99)
Glucose-Capillary: 149 mg/dL — ABNORMAL HIGH (ref 70–99)
Glucose-Capillary: 116 mg/dL — ABNORMAL HIGH (ref 70–99)

## 2023-06-12 MED ORDER — POTASSIUM CHLORIDE CRYS ER 20 MEQ PO TBCR
40.0000 meq | EXTENDED_RELEASE_TABLET | ORAL | Status: DC
  Administered 2023-06-12: 40 meq via ORAL
  Filled 2023-06-12: qty 2

## 2023-06-12 MED ORDER — POTASSIUM CHLORIDE 2 MEQ/ML IV SOLN
INTRAVENOUS | Status: DC
  Filled 2023-06-12 (×2): qty 1000

## 2023-06-12 NOTE — Progress Notes (Signed)
 Occupational Therapy Treatment Patient Details Name: Annette Foster MRN: [id number] DOB: 1940/11/17 Today's Date: 06/12/2023   History of present illness Pt is an 83 yo female admitted for recurrent ascites. Pt underwent TIPs procedure and paracentesis. PMH of afib, HTN, CHF, pacemaker, DM, breast cancer, GERD, hypothyroidism, OSA on CPAP, nonalcoholic liver cirrhosis, with pancytopenia, portal hypertensive gastropathy, grade 2 esophageal varices s/p banding 05/13/2023.   OT comments  Ms Muldrew was seen for OT treatment on this date. Upon arrival to room pt seated on BSC, agreeable to tx. Pt requires  SUPERVISION for toilet t/f no AD use, pericare sitting, and hand washing standing. Reports 8/10 abdominal pain, RN notified. Pt making good progress toward goals, will continue to follow POC. Discharge recommendation remains appropriate.        If plan is discharge home, recommend the following:  A little help with walking and/or transfers;A little help with bathing/dressing/bathroom;Assist for transportation;Assistance with cooking/housework;Help with stairs or ramp for entrance   Equipment Recommendations  BSC/3in1;Other (comment)    Recommendations for Other Services      Precautions / Restrictions Precautions Precautions: Fall Recall of Precautions/Restrictions: Intact Restrictions Weight Bearing Restrictions Per Provider Order: No       Mobility Bed Mobility Overal bed mobility: Modified Independent                  Transfers Overall transfer level: Needs assistance Equipment used: None Transfers: Sit to/from Stand Sit to Stand: Supervision                 Balance Overall balance assessment: Needs assistance Sitting-balance support: Feet supported Sitting balance-Leahy Scale: Normal     Standing balance support: No upper extremity supported, During functional activity Standing balance-Leahy Scale: Fair                             ADL  either performed or assessed with clinical judgement   ADL Overall ADL's : Needs assistance/impaired                                       General ADL Comments: SUPERVISION for toilet t/f no AD use, pericare sitting, and hand washing standing    Extremity/Trunk Assessment Upper Extremity Assessment Upper Extremity Assessment: Overall WFL for tasks assessed   Lower Extremity Assessment Lower Extremity Assessment: Generalized weakness                 Communication Communication Communication: No apparent difficulties   Cognition Arousal: Alert Behavior During Therapy: WFL for tasks assessed/performed Cognition: No apparent impairments                               Following commands: Intact                      Pertinent Vitals/ Pain       Pain Assessment Pain Assessment: 0-10 Pain Score: 8  Pain Location: abdomen Pain Descriptors / Indicators: Aching, Grimacing, Headache Pain Intervention(s): Limited activity within patient's tolerance, Repositioned, Patient requesting pain meds-RN notified   Frequency  Min 1X/week        Progress Toward Goals  OT Goals(current goals can now be found in the care plan section)  Progress towards OT goals: Progressing toward goals  Acute Rehab OT Goals  OT Goal Formulation: With patient/family Time For Goal Achievement: 06/24/23 Potential to Achieve Goals: Good ADL Goals Pt Will Perform Lower Body Dressing: with modified independence;sit to/from stand;with adaptive equipment Pt Will Transfer to Toilet: with modified independence;ambulating Pt Will Perform Toileting - Clothing Manipulation and hygiene: with modified independence Additional ADL Goal #1: Pt will verbalize plan to impelment at least 1 learned falls rpevention strategy.  Plan      Co-evaluation                 AM-PAC OT "6 Clicks" Daily Activity     Outcome Measure   Help from another person eating meals?:  None Help from another person taking care of personal grooming?: None Help from another person toileting, which includes using toliet, bedpan, or urinal?: A Little Help from another person bathing (including washing, rinsing, drying)?: A Little Help from another person to put on and taking off regular upper body clothing?: None Help from another person to put on and taking off regular lower body clothing?: A Little 6 Click Score: 21    End of Session Equipment Utilized During Treatment: Gait belt  OT Visit Diagnosis: Other abnormalities of gait and mobility (R26.89);Muscle weakness (generalized) (M62.81)   Activity Tolerance Patient tolerated treatment well   Patient Left in bed;with call bell/phone within reach;with bed alarm set;with family/visitor present   Nurse Communication Patient requests pain meds        Time: 1343-1401 OT Time Calculation (min): 18 min  Charges: OT General Charges $OT Visit: 1 Visit OT Treatments $Self Care/Home Management : 8-22 mins  Gordan Latina, M.S. OTR/L  06/12/23, 2:12 PM  ascom [phone]

## 2023-06-12 NOTE — Progress Notes (Addendum)
 Progress Note   Patient: Annette Foster ZOX:[id number] DOB: Jun 12, 1940 DOA: 06/04/2023     7 DOS: the patient was seen and examined on 06/12/2023   Brief hospital course:  "83 y.o. 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  She last underwent paracentesis twice in the past 2 weeks on 4/10 and 4/20 with removal of 4.8 And 4.2 L respectively but has had rapid reaccumulation of fluid in spite of compliance with medication.  Patient states that her oncologist referred her to GI for consideration of Pleurx drain but appointment is far away. ED course and data review: Soft BP at 114/55 with otherwise normal vitals Labs with baseline pancytopenia, WBC 2.3, hemoglobin 10.3 and platelets 81.  Creatinine 1.05 from baseline of of 0.82 Lipase and LFTs WNL EKG, personally viewed and interpreted showing dual paced rhythm with occasional PVCs at rate of 74 No imaging done Hospitalist consulted for admission.   4/24.  Patient frustrated that she has recurrent ascites and ends up needing to come back to the hospital.  Paracentesis today removed 4.1 L.  GI made referral to interventional radiology for TIPS procedure for Monday.  Patient had quite a bit of pain after paracentesis. 4/25.  Patient feeling better today.  Not having any abdominal pain that she had last night.  Still need to replace potassium. 4/26.  Patient's abdomen is still very distended.  Looking forward to trying a TIPS procedure to see if this can help out with her recurrent ascites. 4/27.  When I walked in she had the nausea bag in front of her.  Did not eat breakfast.  I  ordered a dose of Phenergan  prior to lunch.  When she gets distended she can eat. 4/28.  Patient feels distended.  Patient had TIPS procedure and 1.5 L with paracentesis. 4/29.  Patient not feeling well today.  Having abdominal pain.  Will give empiric Rocephin .  Will order for another paracentesis for tomorrow and will send off cell counts."   I assumed care on 06/11/23.  4/30 -- U/S today with IR showed no significant ascites.  K 3.4. 5/1 -- pt nauseated this AM requiring IV Zofran . K again 3.4   Assessment and Plan:  Refractory ascites   Status post TIPS on 06/09/23 with Dr. Marne Sings Paracentesis 4/24 removed 4.1 L.   Paracentesis 4/28 removed 1.5 L Cell counts were negative for SBP Repeat U/S on 4/30 showed no significant ascites  Abdominal pain Improved, minimal tenderness on exam today.  Non-distended.  --Continue spironolactone  and Demadex .   --Treated with empiric Rocephin  --Monitor abdominal exam for signs of SBP or ascites recurrence --Outpatient IR follow up in 1 month  Nausea -- after oral potassium this AM --IV Zofran  PRN --Monitor closely  Hypokalemia - Replaced K 3.1 today.  Pt very nauseated after first PO dose of K this morning --Switch to LR + 20 mEq KCL to replace by IV --Repeat BMP in AM  Liver cirrhosis secondary to NASH  Patient with pancytopenia, mild coagulopathy with an INR of 1.6, refractory ascites, history of esophageal varices.  TIPS procedure 4/28. --Continue nadolol  --Continue empiric lactulose   Pancytopenia Secondary liver disease.   --Transfuse as needed for Hbg < 7 or platelets <  50k +bleeding or <10k w/o bleeding --Monitor CBC  Hypothyroidism, unspecified --Continue levothyroxine   Chronic diastolic CHF (congestive heart failure) (HCC) Last EF 55%.   --Torsemide  and nadolol   Hyperlipidemia, unspecified --Continue Zocor   Hypotension --On midodrine   Type 2 diabetes mellitus with complication, with long-term current use of  insulin  - Last hemoglobin A1c back in January at 5.6.   Patient does not need 20 units of long-acting insulin .   Can potentially go home on 5 units.   --Sliding scale Novolog  for now  AKI -resolved Cr was as low  as 0.7 on 4/25.  Creatinine has high as 1.09 on 4/26.   --Monitor BMP      Subjective: Patient not feeling well at all.  Having abdominal pain.  Still with poor appetite.  Still feels distended.  1.5 L was taken off with paracentesis yesterday.  Physical Exam: Vitals:   06/11/23 1531 06/11/23 1939 06/12/23 0604 06/12/23 0750  BP: (!) 115/40 (!) 114/52 (!) 104/45 (!) 111/45  Pulse: 66 68 64 67  Resp: 16 20 20 16   Temp: 97.7 F (36.5 C) 98 F (36.7 C) 97.8 F (36.6 C) 98 F (36.7 C)  TempSrc: Oral Oral Oral Oral  SpO2: 100% 100% 96% 94%  Weight:      Height:       General exam: awake, alert, no acute distress HEENT: moist mucus membranes, hearing grossly normal  Respiratory system: CTA, no wheezes, rales or rhonchi, normal respiratory effort. Cardiovascular system: normal S1/S2, RRR, no pedal edema.   Gastrointestinal system: soft, NT, ND, no HSM felt, +bowel sounds. Central nervous system: A&O x 3. no gross focal neurologic deficits, normal speech Extremities: moves all, no edema, normal tone Skin: dry, intact, normal temperature, normal color, No rashes, lesions or ulcers Psychiatry: normal mood, congruent affect, judgement and insight appear normal     Data Reviewed: Notable labs --  K 3.4 >> 3.1 Ca 7.8 Hbg stable 10.0 Platelets 78 >> 81k improving   Family Communication: Called daughter Christiane Cowing this afternoon but was not able to reach her.  Did not leave unsecured voicemail.   Disposition: Status is: Inpatient Remains inpatient appropriate because: nausea requiring IV Zofran  this AM, ongoing electrolyte derangements.  Anticipate d/c in 24-48 hours     Planned Discharge Destination: Home health    Time spent: 40 minutes  Author: Montey Apa, DO 06/12/2023 2:18 PM  For on call review [email or web address].

## 2023-06-12 NOTE — Progress Notes (Signed)
 Physical Therapy Treatment Patient Details Name: Annette Foster MRN: [id number] DOB: 10-Nov-1940 Today's Date: 06/12/2023   History of Present Illness Pt is an 83 yo female admitted for recurrent ascites. Pt underwent TIPs procedure and paracentesis. PMH of afib, HTN, CHF, pacemaker, DM, breast cancer, GERD, hypothyroidism, OSA on CPAP, nonalcoholic liver cirrhosis, with pancytopenia, portal hypertensive gastropathy, grade 2 esophageal varices s/p banding 05/13/2023.    PT Comments  Pt wakes easily to voice, reported 7/10 abdominal pain. Pt able to perform bed mobility modI, transfer with RW supervision (once without RW a bit impulsively, slight unsteadiness noted), RW for remainder of session. Able to perform pericare in sitting modI as well. She ambulated ~56ft with RW and supervision, exhibited decreased gait velocity and decreased step height/length. Returned to room and set up with breakfast. The patient would benefit from further skilled PT intervention to continue to progress towards goals.     If plan is discharge home, recommend the following: A little help with walking and/or transfers;A little help with bathing/dressing/bathroom;Assistance with cooking/housework;Assist for transportation;Help with stairs or ramp for entrance   Can travel by private vehicle        Equipment Recommendations  Rolling walker (2 wheels)    Recommendations for Other Services       Precautions / Restrictions Precautions Precautions: Fall Recall of Precautions/Restrictions: Intact Restrictions Weight Bearing Restrictions Per Provider Order: No     Mobility  Bed Mobility Overal bed mobility: Modified Independent                  Transfers Overall transfer level: Needs assistance Equipment used: Rolling walker (2 wheels) Transfers: Sit to/from Stand, Bed to chair/wheelchair/BSC Sit to Stand: Supervision           General transfer comment: stood from EOB without RW, RW with  supervision for remainder of transfers    Ambulation/Gait Ambulation/Gait assistance: Supervision Gait Distance (Feet): 90 Feet Assistive device: Rolling walker (2 wheels)   Gait velocity: decreased         Stairs             Wheelchair Mobility     Tilt Bed    Modified Rankin (Stroke Patients Only)       Balance Overall balance assessment: Needs assistance Sitting-balance support: Feet supported Sitting balance-Leahy Scale: Good Sitting balance - Comments: pericare in sitting modI   Standing balance support: Bilateral upper extremity supported, During functional activity, Single extremity supported Standing balance-Leahy Scale: Fair                              Communication    Cognition Arousal: Alert Behavior During Therapy: WFL for tasks assessed/performed   PT - Cognitive impairments: No apparent impairments                                Cueing    Exercises      General Comments        Pertinent Vitals/Pain Pain Assessment Pain Assessment: 0-10 Pain Score: 7  Pain Location: abdomen Pain Descriptors / Indicators: Aching, Grimacing, Headache Pain Intervention(s): Limited activity within patient's tolerance, Monitored during session, Repositioned, Premedicated before session    Home Living                          Prior Function  PT Goals (current goals can now be found in the care plan section) Progress towards PT goals: Progressing toward goals    Frequency    Min 2X/week      PT Plan      Co-evaluation              AM-PAC PT "6 Clicks" Mobility   Outcome Measure  Help needed turning from your back to your side while in a flat bed without using bedrails?: None Help needed moving from lying on your back to sitting on the side of a flat bed without using bedrails?: None Help needed moving to and from a bed to a chair (including a wheelchair)?: None Help needed standing  up from a chair using your arms (e.g., wheelchair or bedside chair)?: None Help needed to walk in hospital room?: A Little Help needed climbing 3-5 steps with a railing? : A Little 6 Click Score: 22    End of Session   Activity Tolerance: Patient tolerated treatment well Patient left: in chair;with call bell/phone within reach;with chair alarm set Nurse Communication: Mobility status PT Visit Diagnosis: Other abnormalities of gait and mobility (R26.89);Difficulty in walking, not elsewhere classified (R26.2);Muscle weakness (generalized) (M62.81)     Time: 1610-9604 PT Time Calculation (min) (ACUTE ONLY): 16 min  Charges:    $Therapeutic Activity: 8-22 mins PT General Charges $$ ACUTE PT VISIT: 1 Visit                     Darien Eden PT, DPT 9:13 AM,06/12/23

## 2023-06-13 DIAGNOSIS — R188 Other ascites: Secondary | ICD-10-CM | POA: Diagnosis not present

## 2023-06-13 LAB — GLUCOSE, CAPILLARY
Glucose-Capillary: 93 mg/dL (ref 70–99)
Glucose-Capillary: 137 mg/dL — ABNORMAL HIGH (ref 70–99)
Glucose-Capillary: 158 mg/dL — ABNORMAL HIGH (ref 70–99)
Glucose-Capillary: 109 mg/dL — ABNORMAL HIGH (ref 70–99)

## 2023-06-13 LAB — BASIC METABOLIC PANEL WITH GFR
Anion gap: 8 (ref 5–15)
BUN: 11 mg/dL (ref 8–23)
CO2: 26 mmol/L (ref 22–32)
Calcium: 8.3 mg/dL — ABNORMAL LOW (ref 8.9–10.3)
Chloride: 101 mmol/L (ref 98–111)
Creatinine, Ser: 0.61 mg/dL (ref 0.44–1.00)
GFR, Estimated: >60 mL/min (ref >60)
Glucose, Bld: 101 mg/dL — ABNORMAL HIGH (ref 70–99)
Potassium: 3.5 mmol/L (ref 3.5–5.1)
Sodium: 135 mmol/L (ref 135–145)

## 2023-06-13 LAB — MAGNESIUM: Magnesium: 2 mg/dL (ref 1.7–2.4)

## 2023-06-13 MED ORDER — SENNOSIDES-DOCUSATE SODIUM 8.6-50 MG PO TABS
1.0000 | ORAL_TABLET | Freq: Two times a day (BID) | ORAL | Status: DC
  Administered 2023-06-13 – 2023-06-17 (×8): 1 via ORAL
  Filled 2023-06-13 (×8): qty 1

## 2023-06-13 MED ORDER — LACTULOSE 10 GM/15ML PO SOLN
20.0000 g | Freq: Three times a day (TID) | ORAL | Status: DC
  Administered 2023-06-13 – 2023-06-15 (×7): 20 g via ORAL
  Filled 2023-06-13 (×7): qty 30

## 2023-06-13 MED ORDER — POTASSIUM CHLORIDE CRYS ER 20 MEQ PO TBCR
40.0000 meq | EXTENDED_RELEASE_TABLET | Freq: Once | ORAL | Status: AC
  Administered 2023-06-13: 40 meq via ORAL
  Filled 2023-06-13: qty 2

## 2023-06-13 NOTE — Plan of Care (Signed)
  Problem: Education: Goal: Ability to describe self-care measures that may prevent or decrease complications (Diabetes Survival Skills Education) will improve Outcome: Progressing Goal: Individualized Educational Video(s) Outcome: Progressing   Problem: Coping: Goal: Ability to adjust to condition or change in health will improve Outcome: Progressing   Problem: Fluid Volume: Goal: Ability to maintain a balanced intake and output will improve Outcome: Progressing   Problem: Health Behavior/Discharge Planning: Goal: Ability to identify and utilize available resources and services will improve Outcome: Progressing Goal: Ability to manage health-related needs will improve Outcome: Progressing   Problem: Metabolic: Goal: Ability to maintain appropriate glucose levels will improve Outcome: Progressing   Problem: Nutritional: Goal: Maintenance of adequate nutrition will improve Outcome: Progressing Goal: Progress toward achieving an optimal weight will improve Outcome: Progressing   Problem: Skin Integrity: Goal: Risk for impaired skin integrity will decrease Outcome: Progressing   Problem: Tissue Perfusion: Goal: Adequacy of tissue perfusion will improve Outcome: Progressing   Problem: Education: Goal: Knowledge of General Education information will improve Description: Including pain rating scale, medication(s)/side effects and non-pharmacologic comfort measures Outcome: Progressing   Problem: Health Behavior/Discharge Planning: Goal: Ability to manage health-related needs will improve Outcome: Progressing   Problem: Clinical Measurements: Goal: Ability to maintain clinical measurements within normal limits will improve Outcome: Progressing Goal: Will remain free from infection Outcome: Progressing Goal: Diagnostic test results will improve Outcome: Progressing Goal: Respiratory complications will improve Outcome: Progressing Goal: Cardiovascular complication will  be avoided Outcome: Progressing   Problem: Activity: Goal: Risk for activity intolerance will decrease Outcome: Progressing   Problem: Nutrition: Goal: Adequate nutrition will be maintained Outcome: Progressing   Problem: Coping: Goal: Level of anxiety will decrease Outcome: Progressing   Problem: Elimination: Goal: Will not experience complications related to bowel motility Outcome: Progressing Goal: Will not experience complications related to urinary retention Outcome: Progressing   Problem: Pain Managment: Goal: General experience of comfort will improve and/or be controlled Outcome: Progressing   Problem: Safety: Goal: Ability to remain free from injury will improve Outcome: Progressing   Problem: Education: Goal: Understanding of CV disease, CV risk reduction, and recovery process will improve Outcome: Progressing Goal: Individualized Educational Video(s) Outcome: Progressing   Problem: Skin Integrity: Goal: Risk for impaired skin integrity will decrease Outcome: Progressing   Problem: Activity: Goal: Ability to return to baseline activity level will improve Outcome: Progressing   Problem: Health Behavior/Discharge Planning: Goal: Ability to safely manage health-related needs after discharge will improve Outcome: Progressing

## 2023-06-13 NOTE — Plan of Care (Signed)
   Problem: Education: Goal: Knowledge of General Education information will improve Description: Including pain rating scale, medication(s)/side effects and non-pharmacologic comfort measures Outcome: Progressing   Problem: Activity: Goal: Risk for activity intolerance will decrease Outcome: Progressing   Problem: Coping: Goal: Level of anxiety will decrease Outcome: Progressing

## 2023-06-13 NOTE — Progress Notes (Signed)
 Progress Note   Patient: Annette Foster ZOX:[id number] DOB: 1940/12/29 DOA: 06/04/2023     8 DOS: the patient was seen and examined on 06/13/2023   Brief hospital course:  "83 y.o. 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  She last underwent paracentesis twice in the past 2 weeks on 4/10 and 4/20 with removal of 4.8 And 4.2 L respectively but has had rapid reaccumulation of fluid in spite of compliance with medication.  Patient states that her oncologist referred her to GI for consideration of Pleurx drain but appointment is far away. ED course and data review: Soft BP at 114/55 with otherwise normal vitals Labs with baseline pancytopenia, WBC 2.3, hemoglobin 10.3 and platelets 81.  Creatinine 1.05 from baseline of of 0.82 Lipase and LFTs WNL EKG, personally viewed and interpreted showing dual paced rhythm with occasional PVCs at rate of 74 No imaging done Hospitalist consulted for admission.   4/24.  Patient frustrated that she has recurrent ascites and ends up needing to come back to the hospital.  Paracentesis today removed 4.1 L.  GI made referral to interventional radiology for TIPS procedure for Monday.  Patient had quite a bit of pain after paracentesis. 4/25.  Patient feeling better today.  Not having any abdominal pain that she had last night.  Still need to replace potassium. 4/26.  Patient's abdomen is still very distended.  Looking forward to trying a TIPS procedure to see if this can help out with her recurrent ascites. 4/27.  When I walked in she had the nausea bag in front of her.  Did not eat breakfast.  I  ordered a dose of Phenergan  prior to lunch.  When she gets distended she can eat. 4/28.  Patient feels distended.  Patient had TIPS procedure and 1.5 L with paracentesis. 4/29.  Patient not feeling well today.  Having abdominal pain.  Will give empiric Rocephin .  Will order for another paracentesis for tomorrow and will send off cell counts."   I assumed care on 06/11/23.  4/30 -- U/S today with IR showed no significant ascites.  K 3.4. 5/1 -- pt nauseated this AM requiring IV Zofran . K again 3.4 5/2 -- ongoing abdominal pain and tenderness, no BM since 4/29  Assessment and Plan:  Refractory ascites   Status post TIPS on 06/09/23 with Dr. Marne Sings Paracentesis 4/24 removed 4.1 L.   Paracentesis 4/28 removed 1.5 L After chart review - appears no cell counts were obtained. Reviewed cell counts from 4/19 paracentesis - negative for SBP Repeat U/S on 4/30 showed no significant ascites   Abdominal pain - initially due to above, now seems multifactorial Constipation Persistent, with increased tenderness on exam today.  Non-distended. --Continue spironolactone  and Demadex .   --Resume empiric antibiotics (cefotaxime) since we have not adequately ruled out SBP, no cell counts were sent with paracentesis earlier this admission --Increase bowel regimen - titrate for 2-3 soft BM's daily --Monitor abdominal exam for signs of SBP or ascites recurrence --Outpatient IR follow up in 1 month  Nausea -- after oral potassium this AM --IV Zofran  PRN --Monitor closely  Hypokalemia - Replaced K 3.1 today.  Pt very nauseated after first PO dose of K this morning --Switch  to LR + 20 mEq KCL to replace by IV --Repeat BMP in AM  Liver cirrhosis secondary to NASH  Patient with pancytopenia, mild coagulopathy with an INR of 1.6, refractory ascites, history of esophageal varices.  TIPS procedure 4/28. --Continue nadolol  --Continue empiric lactulose   Pancytopenia Secondary liver disease.    --Transfuse as needed for Hbg < 7 or platelets < 50k +bleeding or <10k w/o bleeding --Monitor CBC  Hypothyroidism, unspecified --Continue levothyroxine   Chronic diastolic CHF (congestive heart failure) (HCC) Last EF 55%.   --Torsemide  and nadolol   Hyperlipidemia, unspecified --Continue Zocor   Hypotension --On midodrine   Type 2 diabetes mellitus with complication, with long-term current use of insulin  - Last hemoglobin A1c back in January at 5.6.   Patient does not need 20 units of long-acting insulin .   Can potentially go home on 5 units.   --Sliding scale Novolog  for now  AKI -resolved Cr was as low  as 0.7 on 4/25.  Creatinine has high as 1.09 on 4/26.   --Monitor BMP      Subjective: Patient continues to report a lot of abdominal pain and intermittent nausea.  She reports taking lactulose  but no bowel movement in a few days.  Discussed need for 2-3 soft BM's daily, lactulose  was increased.   No fever or chills.    Physical Exam: Vitals:   06/12/23 2134 06/13/23 0443 06/13/23 0750 06/13/23 1253  BP: (!) 119/54 (!) 102/46 (!) 94/48 102/67  Pulse: 79 72 70 76  Resp: 20 16 20 18   Temp: 97.6 F (36.4 C) 98.4 F (36.9 C) 98.6 F (37 C) 97.6 F (36.4 C)  TempSrc: Oral Oral Oral Oral  SpO2: 100% 95% 98% 99%  Weight:      Height:       General exam: awake, alert, no acute distress, mildly ill appearing HEENT: moist mucus membranes, hearing grossly normal  Respiratory system: on room air, normal respiratory effort. Cardiovascular system: RRR, no pedal edema.   Gastrointestinal system: soft, diffuse tenderness with guarding, no rebound or peritoneal signs Central nervous system: A&O x 3. no gross focal neurologic deficits, normal speech Extremities: moves all, no edema, normal tone Skin: dry, intact, normal temperature, normal color, No rashes, lesions or ulcers Psychiatry: normal mood, congruent affect, judgement and insight appear normal     Data  Reviewed: Notable labs --   Ca 8.3     Family Communication: None present,will attempt to call as time allows.  5/1 - Called daughter Christiane Cowing this afternoon but was not able to reach her.  Did not leave unsecured voicemail.   Disposition: Status is: Inpatient Remains inpatient appropriate because: persistent abdominal pain with ongoing evaluation  Anticipate d/c in 24-48 hours if improved.    Planned Discharge Destination: Home health    Time spent: 45 minutes  Author: Montey Apa, DO 06/13/2023 4:04 PM  For on call review [email or web address].

## 2023-06-13 NOTE — Discharge Summary (Incomplete)
 Physician Discharge Summary   Patient: Annette Foster MRN: [id number] DOB: January 19, 1941  Admit date:     06/04/2023  Discharge date: {dischdate:26783}  Discharge Physician: Montey Apa   PCP: Westley Hammers, MD   Recommendations at discharge:  {Tip this will not be part of the note when signed- Example include specific recommendations for outpatient follow-up, pending tests to follow-up on. (Optional):26781}  ***  Discharge Diagnoses: Principal Problem:   Refractory ascites Active Problems:   Abdominal pain   Hypokalemia   Pancytopenia (HCC)   Liver cirrhosis secondary to NASH (HCC)   Hypothyroidism, unspecified   Chronic diastolic CHF (congestive heart failure) (HCC)   Hyperlipidemia, unspecified   AKI (acute kidney injury) (HCC)   Type 2 diabetes mellitus with complication, with long-term current use of insulin  (HCC)   Hypotension  Resolved Problems:   * No resolved hospital problems. *  Hospital Course: 83 y.o. 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  She last underwent paracentesis twice in the past 2 weeks on 4/10 and 4/20 with removal of 4.8 And 4.2 L respectively but has had rapid reaccumulation of fluid in spite of compliance with medication.  Patient states that her oncologist referred her to GI for consideration of Pleurx drain but appointment is far away. ED course and data review: Soft BP at 114/55 with otherwise normal vitals Labs with baseline pancytopenia, WBC 2.3, hemoglobin 10.3 and platelets 81.  Creatinine 1.05 from baseline of of 0.82 Lipase and LFTs WNL EKG, personally viewed and  interpreted showing dual paced rhythm with occasional PVCs at rate of 74 No imaging done Hospitalist consulted for admission.  4/24.  Patient frustrated that she has recurrent ascites and ends up needing to come back to the hospital.  Paracentesis today removed 4.1 L.  GI made referral to interventional radiology for TIPS procedure for Monday.  Patient had quite a bit of pain after paracentesis. 4/25.  Patient feeling better today.  Not having any abdominal pain that she had last night.  Still need to replace potassium. 4/26.  Patient's abdomen is still very distended.  Looking forward to trying a TIPS procedure to see if this can help out with her recurrent ascites. 4/27.  When I walked in she had the nausea bag in front of her.  Did not eat breakfast.  I ordered a dose of Phenergan  prior to lunch.  When she gets distended she can eat. 4/28.  Patient feels distended.  Patient had TIPS procedure and 1.5 L with paracentesis. 4/29.  Patient not feeling well today.  Having abdominal pain.  Will give empiric Rocephin .  Will order for another paracentesis for tomorrow and will send off cell counts.   Assessment and Plan: * Refractory ascites Paracentesis 4/24 removed 4.1 L of fluid.  Continue spironolactone  and Demadex .  TIPS procedure done 4/28 along with another paracentesis of 1.5 L.  Patient not feeling well and having abdominal pain.  Will give empiric Rocephin .  Will order for another paracentesis for tomorrow.  Abdominal pain Patient a tender today with palpation secondary to distention.  Patient also nauseous with her abdominal distention.  Will order for another paracentesis for tomorrow with cell counts.  Empiric Rocephin .  Hypokalemia Replace  Liver cirrhosis secondary to NASH [facility]) Patient with pancytopenia, mild coagulopathy with an INR of 1.6, refractory ascites, history of esophageal varices.  Continue nadolol .  TIPS procedure 4/28.  Patient also started on empiric lactulose  after  TIPS procedure.  Pancytopenia (HCC) Secondary liver disease.  Blood counts better today.  White blood cell count up to 3.7, hemoglobin 10.7 and platelet count 78  Hypothyroidism, unspecified Continue levothyroxine   Chronic diastolic CHF (congestive heart failure) (HCC) Last EF 55%.  Torsemide  and nadolol   Hyperlipidemia, unspecified Continue Zocor   Hypotension On midodrine   Type 2 diabetes mellitus with complication, with long-term current use of insulin  (HCC) Last hemoglobin A1c back in January at 5.6.  Patient does not need 20 units of long-acting insulin .  Can potentially go home on 5 units.  Will just keep on sliding scale today.  AKI (acute kidney injury) (HCC) Cr was as low  as 0.7 on 4/25.  Creatinine has high as 1.09 on 4/26.  Cr 0.84 today. Watch with oral duiresis.      {Tip this will not be part of the note when signed Body mass index is 26.75 kg/m. , ,  (Optional):26781}  {(NOTE) Pain control PDMP Statment (Optional):26782} Consultants: *** Procedures performed: ***  Disposition: {Plan; Disposition:26390} Diet recommendation:  {Diet_Plan:26776} DISCHARGE MEDICATION: Allergies as of 06/13/2023       Reactions   Codeine Itching, Nausea And Vomiting, Other (See Comments)   Demeclocycline Rash   Demerol [meperidine] Itching, Nausea And Vomiting   Hydrocodone Itching, Nausea And Vomiting   Morphine  Nausea Only   Other Other (See Comments)   Oxycodone  Itching, Nausea And Vomiting   Pentazocine Itching, Nausea And Vomiting, Other (See Comments)   Tetracyclines & Related Rash   Coal Tar Extract Other (See Comments)   Hydrocodone-acetaminophen  Other (See Comments)   Salicylic Acid Other (See Comments)   Tetracycline Hcl Other (See Comments)     Med Rec must be completed prior to using this [facility]***        Durable Medical Equipment  (From admission, onward)           Start     Ordered   06/10/23 1616  For home use only DME Walker rolling  Once        Question Answer Comment  Walker: With 5 Inch Wheels   Patient needs a walker to treat with the following condition Unsteady gait when walking      06/10/23 1615   06/10/23 1615  For home use only DME Bedside commode  Once       Question:  Patient needs a bedside commode to treat with the following condition  Answer:  Unsteady gait when walking   06/10/23 1615            Discharge Exam: Filed Weights   06/04/23 1726  Weight: 68.5 kg   ***  Condition at discharge: {DC Condition:26389}  The results of significant diagnostics from this hospitalization (including imaging, microbiology, ancillary and laboratory) are listed below for reference.   Imaging Studies: US  Abdomen Limited Result Date: 06/11/2023 CLINICAL DATA:  NASH cirrhosis, history of recurrent ascites now status post TIPS creation. Assess for drainable ascites. EXAM: LIMITED ABDOMEN ULTRASOUND FOR ASCITES TECHNIQUE: Limited ultrasound survey for ascites was performed in all four abdominal quadrants. COMPARISON:  Paracentesis 06/09/2023 FINDINGS: Sonographic interrogation of the abdomen demonstrates no evidence of ascites. IMPRESSION: Negative for ascites. Electronically Signed   By: Fernando Hoyer M.D.   On: 06/11/2023 12:50  IR Tips Result Date: 06/09/2023 CLINICAL DATA:  83 year old female with NASH cirrhosis complicated by recurrent large volume ascites requiring frequent paracentesis. She also has a history of esophageal varices status post prior banding procedures. She presents for elective TIPS creation. EXAM: 1. Ultrasound-guided paracentesis 2. TIPS creation utilizing intracardiac echocardiography (ICE) as intravascular ultrasound guidance MEDICATIONS: In patient already receiving IV Rocephin . No additional prophylaxis administered. ANESTHESIA/SEDATION: General - as administered by the Anesthesia department CONTRAST:  100 mL Omnipaque  300 FLUOROSCOPY TIME:  Radiation exposure index: 746 mGy reference air kerma  COMPLICATIONS: None immediate. PROCEDURE: Informed written consent was obtained from the patient after a thorough discussion of the procedural risks, benefits and alternatives. All questions were addressed. Maximal Sterile Barrier Technique was utilized including caps, mask, sterile gowns, sterile gloves, sterile drape, hand hygiene and skin antiseptic. A timeout was performed prior to the initiation of the procedure. The right upper quadrant was interrogated with ultrasound. There is a relatively small volume ascites in the perihepatic station. A suitable skin entry site was selected. A smaller the was made. Under real-time ultrasound guidance, a 6 French Safe-T-Centesis catheter was advanced into the perihepatic fluid collection. Paracentesis was then performed yielding approximately 1.5 L of golden the ascites. The right common femoral vein was interrogated with ultrasound and found to be widely patent. An image was obtained and stored for the medical record. Local anesthesia was attained by infiltration with 1% lidocaine . A small dermatotomy was made. Under real-time sonographic guidance, the vessel was punctured with a 21 gauge micropuncture needle. Using standard technique, the initial micro needle was exchanged over a 0.018 micro wire for a transitional 4 [place] micro sheath. The micro sheath was then exchanged over a 0.035 wire for 8 French vascular sheath which was advanced into the inferior vena cava. The 8 French intracardiac echocardiography catheter was then advanced through the sheath and positioned in the inferior vena cava within the hepatic segment. Intravascular ultrasound was then used to identify the portal venous anatomy. The right internal jugular vein was interrogated with ultrasound and found to be widely patent. An image was obtained and stored for the medical record. Local anesthesia was attained by infiltration with 1% lidocaine . A small dermatotomy was made. Under real-time sonographic  guidance, the vessel was punctured with a 21 gauge micropuncture needle. Using standard technique, the initial micro needle was exchanged over a 0.018 micro wire for a transitional 4 [place] micro sheath. The micro sheath was then exchanged over a 0.035 wire for a 10 French dilator in the percutaneous tract was dilated. A 10 French cook flexor sheath was then advanced over the wire and into the inferior vena cava. An angled MPA catheter was advanced through the sheath and used to select several hepatic veins. Hepatic venography was performed. Of the middle and right hepatic veins were catheterized and venography performed. Initially, the catheter was parked in the right hepatic vein and an Amplatz wire advanced. The catheter was exchanged in the tips sheath advanced over the wire. The scorpion set was then advanced over the wire. A single initial pass was attempted, however the angle was suboptimal in the portal vein could not be accessed. The scorpion set was removed in the catheter was used to select the middle hepatic vein. The Amplatz wire was advanced and parked. The 10 French flexor sheath was advanced into the vein followed by the scorpion set. This time, on a single pass the scorpion device was successfully advanced into the portal vein. Contrast injection confirms  puncture of the central left portal vein. The Glidewire Advantage was advanced into the splenic vein. The scorpion set was exchanged for a marker pigtail catheter. A portal venogram was performed. Confirmation of puncture of the central left portal system. Several small collaterals are evident including the left gastric and posterior gastric veins. Pressures were measured. The initial portal venous pressure was 32 mm of Hg and the right atrial pressure was 2 mm of Hg yielding a portosystemic gradient of 30 mmHg. The Amplatz wire was advanced into the splenic vein. The pigtail catheter was removed. The transhepatic tract was dilated to 6 mm using a  6 x 40 mm [place] balloon. The 10 French sheath was then advanced over the balloon as it was deflated. A 6 cm covered Viatorr stent graft was selected. The stent graft was advanced over the wire and into the main portal vein. The stent was unsheathed. The non covered portion was allowed to open fully and was pulled back snug against the entry point into the left portal vein. The remainder of the stent was then deployed. The stent was post dilated to 8 mm using an 8 x 80 mm Athletis balloon. The pigtail catheter was reintroduced over the wire. Follow-up portal venography demonstrates a well-positioned and widely patent right middle to left portal TIPS. No further filling of venous collaterals. Pressures were again measured. Post tips portal venous pressure was 12 mm of Hg and the right atrial pressure was 5 mm of Hg consistent with a portosystemic gradient of 7 mm Hg. The pigtail catheter was removed over a wire. The right IJ sheath was removed and hemostasis attained by manual pressure. The ice catheter was removed as well as the right common femoral sheath. Hemostasis was attained by manual compression. IMPRESSION: 1. Successful paracentesis yielding 1.5 L. 2. Successful 8 mm TIPS creation. 3. Initial portosystemic gradient was 30 mm of Hg which corrected to 7 mm of Hg following TIPS creation. Patient will be admitted to the hospitalist service for overnight observation. Outpatient follow-up with a TIPS ultrasound in 1 month. Electronically Signed   By: Fernando Hoyer M.D.   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 She also has a history of esophageal varices status post prior banding procedures. She presents for elective TIPS creation. EXAM: 1. Ultrasound-guided paracentesis 2. TIPS creation utilizing intracardiac echocardiography (ICE) as intravascular  ultrasound guidance MEDICATIONS: In patient already receiving IV Rocephin . No additional prophylaxis administered. ANESTHESIA/SEDATION: General - as administered by the Anesthesia department CONTRAST:  100 mL Omnipaque  300 FLUOROSCOPY TIME:  Radiation exposure index: 746 mGy reference air kerma COMPLICATIONS: None immediate. PROCEDURE: Informed written consent was obtained from the patient after a thorough discussion of the procedural risks, benefits and alternatives. All questions were addressed. Maximal Sterile Barrier Technique was utilized including caps, mask, sterile gowns, sterile gloves, sterile drape, hand hygiene and skin antiseptic. A timeout was performed prior to the initiation of the procedure. The right upper quadrant was interrogated with ultrasound. There is a relatively small volume ascites in the perihepatic station. A suitable skin entry site was selected. A smaller the was made. Under real-time ultrasound guidance, a 6 French Safe-T-Centesis catheter was advanced into the perihepatic fluid collection. Paracentesis was then performed yielding approximately 1.5 L of golden the ascites. The right common femoral vein was interrogated with ultrasound and found to be widely patent. An image was  obtained and stored for the medical record. Local anesthesia was attained by infiltration with 1% lidocaine . A small dermatotomy was made. Under real-time sonographic guidance, the vessel was punctured with a 21 gauge micropuncture needle. Using standard technique, the initial micro needle was exchanged over a 0.018 micro wire for a transitional 4 [place] micro sheath. The micro sheath was then exchanged over a 0.035 wire for 8 French vascular sheath which was advanced into the inferior vena cava. The 8 French intracardiac echocardiography catheter was then advanced through the sheath and positioned in the inferior vena cava within the hepatic segment. Intravascular ultrasound was then used to identify the  portal venous anatomy. The right internal jugular vein was interrogated with ultrasound and found to be widely patent. An image was obtained and stored for the medical record. Local anesthesia was attained by infiltration with 1% lidocaine . A small dermatotomy was made. Under real-time sonographic guidance, the vessel was punctured with a 21 gauge micropuncture needle. Using standard technique, the initial micro needle was exchanged over a 0.018 micro wire for a transitional 4 [place] micro sheath. The micro sheath was then exchanged over a 0.035 wire for a 10 French dilator in the percutaneous tract was dilated. A 10 French cook flexor sheath was then advanced over the wire and into the inferior vena cava. An angled MPA catheter was advanced through the sheath and used to select several hepatic veins. Hepatic venography was performed. Of the middle and right hepatic veins were catheterized and venography performed. Initially, the catheter was parked in the right hepatic vein and an Amplatz wire advanced. The catheter was exchanged in the tips sheath advanced over the wire. The scorpion set was then advanced over the wire. A single initial pass was attempted, however the angle was suboptimal in the portal vein could not be accessed. The scorpion set was removed in the catheter was used to select the middle hepatic vein. The Amplatz wire was advanced and parked. The 10 French flexor sheath was advanced into the vein followed by the scorpion set. This time, on a single pass the scorpion device was successfully advanced into the portal vein. Contrast injection confirms puncture of the central left portal vein. The Glidewire Advantage was advanced into the splenic vein. The scorpion set was exchanged for a marker pigtail catheter. A portal venogram was performed. Confirmation of puncture of the central left portal system. Several small collaterals are evident including the left gastric and posterior gastric veins.  Pressures were measured. The initial portal venous pressure was 32 mm of Hg and the right atrial pressure was 2 mm of Hg yielding a portosystemic gradient of 30 mmHg. The Amplatz wire was advanced into the splenic vein. The pigtail catheter was removed. The transhepatic tract was dilated to 6 mm using a 6 x 40 mm [place] balloon. The 10 French sheath was then advanced over the balloon as it was deflated. A 6 cm covered Viatorr stent graft was selected. The stent graft was advanced over the wire and into the main portal vein. The stent was unsheathed. The non covered portion was allowed to open fully and was pulled back snug against the entry point into the left portal vein. The remainder of the stent was then deployed. The stent was post dilated to 8 mm using an 8 x 80 mm Athletis balloon. The pigtail catheter was reintroduced over the wire. Follow-up portal venography demonstrates a well-positioned and widely patent right middle to left portal TIPS. No further filling of  venous collaterals. Pressures were again measured. Post tips portal venous pressure was 12 mm of Hg and the right atrial pressure was 5 mm of Hg consistent with a portosystemic gradient of 7 mm Hg. The pigtail catheter was removed over a wire. The right IJ sheath was removed and hemostasis attained by manual pressure. The ice catheter was removed as well as the right common femoral sheath. Hemostasis was attained by manual compression. IMPRESSION: 1. Successful paracentesis yielding 1.5 L. 2. Successful 8 mm TIPS creation. 3. Initial portosystemic gradient was 30 mm of Hg which corrected to 7 mm of Hg following TIPS creation. Patient will be admitted to the hospitalist service for overnight observation. Outpatient follow-up with a TIPS ultrasound in 1 month. Electronically Signed   By: Fernando Hoyer M.D.   On: 06/09/2023 16:39   US  Paracentesis Result Date: 06/05/2023 INDICATION: Patient with history of NASH cirrhosis, recurrent ascites.  Request to IR for paracentesis. EXAM: ULTRASOUND GUIDED THERAPEUTIC PARACENTESIS MEDICATIONS: 6 mL 1% lidocaine  COMPLICATIONS: None immediate. PROCEDURE: Informed written consent was obtained from the patient after a discussion of the risks, benefits and alternatives to treatment. A timeout was performed prior to the initiation of the procedure. Initial ultrasound scanning demonstrates a large amount of ascites within the right lower abdominal quadrant. The right lower abdomen was prepped and draped in the usual sterile fashion. 1% lidocaine  was used for local anesthesia. Following this, a 5 Fr, 7-cm, Merit One Step centesis catheter was introduced. An ultrasound image was saved for documentation purposes. The paracentesis was performed. The catheter was removed and a dressing was applied. The patient tolerated the procedure well without immediate post procedural complication. FINDINGS: A total of approximately 4.1 L of clear, amber fluid was removed. IMPRESSION: Successful ultrasound-guided paracentesis yielding 4.1 liters of peritoneal fluid. Performed by Nathan Bake, PA-C PLAN: The patient has required >/=2 paracenteses in a 30 day period and a formal evaluation by the [facility] Interventional Radiology [facility]pert[facility] has been arranged. Electronically Signed   By: Elene Griffes M.D.   On: 06/05/2023 15:41   US  Paracentesis Result Date: 06/01/2023 INDICATION: Recurrent ascites.  History of NASH cirrhosis. EXAM: ULTRASOUND GUIDED THERAPEUTIC PARACENTESIS MEDICATIONS: None. COMPLICATIONS: None immediate. PROCEDURE: Informed written consent was obtained from the patient after a discussion of the risks, benefits and alternatives to treatment. A timeout was performed prior to the initiation of the procedure. Initial ultrasound scanning demonstrates a large amount of ascites within the right lower abdominal quadrant. The right lower abdomen was prepped and draped in the usual sterile fashion. 1%  lidocaine  was used for local anesthesia. Following this, a 6 Fr Safe-T-Centesis catheter was introduced. An ultrasound image was saved for documentation purposes. The paracentesis was performed. The catheter was removed and a dressing was applied. The patient tolerated the procedure well without immediate post procedural complication. Patient received post-procedure intravenous albumin ; see nursing notes for details. FINDINGS: A total of approximately 4200 mL of straw-colored ascitic fluid was removed. IMPRESSION: Successful ultrasound-guided paracentesis yielding 4.2 liters of peritoneal fluid. PLAN: If the patient eventually requires >/=2 paracenteses in a 30 day period, candidacy for formal evaluation by the [facility] Interventional Radiology [facility] Hypertension [facility] will be assessed. Electronically Signed   By: Fernando Hoyer M.D.   On: 06/01/2023 11:56   DG Chest 2 View Result Date: 05/30/2023 CLINICAL DATA:  sob EXAM: CHEST - 2 VIEW COMPARISON:  04/01/2023. FINDINGS: Cardiac silhouette enlarged. No evidence of pneumothorax or pleural effusion. No evidence of pulmonary edema.  Calcified aorta. Thoracic degenerative changes. Right-sided pacer. IMPRESSION: Enlarged cardiac silhouette. No acute cardiopulmonary process. Electronically Signed   By: Sydell Eva M.D.   On: 05/30/2023 20:27   US  Paracentesis Result Date: 05/22/2023 INDICATION: 83 year old female with nonalcoholic liver cirrhosis with new ascites. First encounter with IR for paracentesis today. EXAM: ULTRASOUND GUIDED THERAPEUTIC AND DIAGNOSTIC PARACENTESIS MEDICATIONS: 9 mL 1% lidocaine  COMPLICATIONS: None immediate. PROCEDURE: Informed written consent was obtained from the patient after a discussion of the risks, benefits and alternatives to treatment. A timeout was performed prior to the initiation of the procedure. Initial ultrasound scanning demonstrates a large amount of ascites within the right abdomen. The right lower abdomen was  prepped and draped in the usual sterile fashion. 1% lidocaine  was used for local anesthesia. Following this, a 19 gauge, 7-cm, Yueh catheter was introduced. An ultrasound image was saved for documentation purposes. The paracentesis was performed. The catheter was removed and a dressing was applied. The patient tolerated the procedure well without immediate post procedural complication. Patient received post-procedure intravenous albumin ; see nursing notes for details. FINDINGS: A total of approximately4.8 liters of hazy amber fluid was removed. Samples were sent to the laboratory as requested by the clinical team. IMPRESSION: Successful ultrasound-guided paracentesis yielding 4.8 liters of peritoneal fluid. PLAN: If the patient eventually requires >/=2 paracenteses in a 30 day period, candidacy for formal evaluation by the [facility] will be assessed. Performed by Terressa Fess, NP Electronically Signed   By: Myrlene Asper D.O.   On: 05/22/2023 14:16   CT ABDOMEN PELVIS W CONTRAST Result Date: 05/21/2023 CLINICAL DATA:  Abdominal pain.  Ascites.  Decreased hemoglobin. EXAM: CT ABDOMEN AND PELVIS WITH CONTRAST TECHNIQUE: Multidetector CT imaging of the abdomen and pelvis was performed using the standard protocol following bolus administration of intravenous contrast. RADIATION DOSE REDUCTION: This exam was performed according to the departmental dose-optimization program which includes automated exposure control, adjustment of the mA and/or kV according to patient size and/or use of iterative reconstruction technique. CONTRAST:  OMNIPAQUE  IOHEXOL  300 MG/ML  SOLN COMPARISON:  CT dated 09/11/2022. FINDINGS: Lower chest: Bibasilar atelectasis/scarring. Cardiac pacemaker noted. No intra-abdominal free air.  Moderate ascites. Hepatobiliary: Cirrhosis.  No biliary dilatation.  Cholecystectomy. Pancreas: Unremarkable. No pancreatic ductal dilatation or  surrounding inflammatory changes. Spleen: Mild splenomegaly measuring 16 cm in length. Adrenals/Urinary Tract: The adrenal glands are unremarkable. There is no hydronephrosis on either side. There is symmetric enhancement and excretion of contrast by both kidneys. The urinary bladder is grossly unremarkable. Stomach/Bowel: Mild sigmoid diverticulosis. Diffuse thickened appearance of the colon likely related to ascites and hepatic colopathy. Colitis is less likely. Clinical correlation is recommended. There is a small hiatal hernia. There is no bowel obstruction. Appendectomy. Vascular/Lymphatic: Moderate aortoiliac atherosclerotic disease. The IVC is unremarkable. No portal venous gas. [place] off a geode varices. No adenopathy. Reproductive: Hysterectomy. Other: Mild subcutaneous edema. Musculoskeletal: Osteopenia with degenerative changes. No acute osseous pathology. IMPRESSION: 1. Cirrhosis with moderate ascites and mild splenomegaly. 2. Mild sigmoid diverticulosis. No bowel obstruction. Electronically Signed   By: Angus Bark M.D.   On: 05/21/2023 18:42    Microbiology: Results for orders placed or performed during the hospital encounter of 05/30/23  Peritoneal fluid culture w Gram Stain     Status: None   Collection Time: 05/30/23  6:09 PM   Specimen: Peritoneal Washings; Peritoneal Fluid  Result Value Ref Range Status   Specimen Description   Final    PERITONEAL  Performed at [facility], [address], [place], [place] [address]    Special Requests   Final    NONE Performed at [facility], [address] [place], [place] [address]    Gram Stain NO WBC SEEN NO ORGANISMS SEEN   Final   Culture   Final    NO GROWTH 3 DAYS Performed at [facility], [address], [place], [place] [address]    Report Status 06/03/2023 FINAL  Final  Body fluid culture w Gram Stain     Status: None   Collection Time: 05/31/23  4:53 PM   Specimen: PATH Cytology Peritoneal  fluid  Result Value Ref Range Status   Specimen Description   Final    PERITONEAL Performed at [facility], [address], [place], [place] [address]    Special Requests   Final    NONE Performed at [facility], [address], [place], [place] [address]    Gram Stain NO WBC SEEN NO ORGANISMS SEEN   Final   Culture   Final    NO GROWTH 3 DAYS Performed at [facility], [address], [place], [place] [address]    Report Status 06/04/2023 FINAL  Final    Labs: CBC: Recent Labs  Lab 06/08/23 1153 06/09/23 0500 06/10/23 0455 06/12/23 0505  WBC 1.7* 1.6* 3.7* 5.4  HGB 9.2* 8.9* 10.7* 10.0*  HCT 27.9* 26.1* 31.0* 29.1*  MCV 96.5 92.9 92.3 93.3  PLT 58* 56* 78* 81*   Basic Metabolic Panel: Recent Labs  Lab 06/09/23 0500 06/10/23 0455 06/11/23 0513 06/12/23 0505 06/13/23 0459  NA 136 138 139 136 135  K 3.5 3.4* 3.4* 3.1* 3.5  CL 103 104 103 102 101  CO2 28 24 26 29 26   GLUCOSE 94 144* 102* 124* 101*  BUN 15 19 18 17 11   CREATININE 0.86 0.84 0.64 0.80 0.61  CALCIUM 8.4* 8.7* 8.3* 7.8* 8.3*  MG  --   --  1.9  --  2.0   Liver Function Tests: Recent Labs  Lab 06/09/23 0500 06/10/23 0455  AST 33 84*  ALT 18 41  ALKPHOS 45 45  BILITOT 0.7 1.6*  PROT 6.1* 6.0*  ALBUMIN  3.1* 2.9*   CBG: Recent Labs  Lab 06/11/23 1941 06/12/23 0751 06/12/23 1106 06/12/23 2134 06/13/23 0746  GLUCAP 151* 124* 149* 116* 93    Discharge time spent: {LESS THAN/GREATER THAN:26388} 30 minutes.  Signed: Montey Apa, DO [facility] 06/13/2023

## 2023-06-14 ENCOUNTER — Inpatient Hospital Stay

## 2023-06-14 DIAGNOSIS — R188 Other ascites: Secondary | ICD-10-CM | POA: Diagnosis not present

## 2023-06-14 LAB — GLUCOSE, CAPILLARY
Glucose-Capillary: 134 mg/dL — ABNORMAL HIGH (ref 70–99)
Glucose-Capillary: 164 mg/dL — ABNORMAL HIGH (ref 70–99)
Glucose-Capillary: 149 mg/dL — ABNORMAL HIGH (ref 70–99)
Glucose-Capillary: 176 mg/dL — ABNORMAL HIGH (ref 70–99)

## 2023-06-14 LAB — CBC
HCT: 31.1 % — ABNORMAL LOW (ref 36.0–46.0)
Hemoglobin: 10.7 g/dL — ABNORMAL LOW (ref 12.0–15.0)
MCH: 32 pg (ref 26.0–34.0)
MCHC: 34.4 g/dL (ref 30.0–36.0)
MCV: 93.1 fL (ref 80.0–100.0)
Platelets: 105 10*3/uL — ABNORMAL LOW (ref 150–400)
RBC: 3.34 MIL/uL — ABNORMAL LOW (ref 3.87–5.11)
RDW: 15.3 % (ref 11.5–15.5)
WBC: 4.9 10*3/uL (ref 4.0–10.5)
nRBC: 0 % (ref 0.0–0.2)

## 2023-06-14 LAB — BASIC METABOLIC PANEL WITH GFR
Anion gap: 9 (ref 5–15)
BUN: 11 mg/dL (ref 8–23)
CO2: 28 mmol/L (ref 22–32)
Calcium: 8.5 mg/dL — ABNORMAL LOW (ref 8.9–10.3)
Chloride: 98 mmol/L (ref 98–111)
Creatinine, Ser: 0.7 mg/dL (ref 0.44–1.00)
GFR, Estimated: >60 mL/min (ref >60)
Glucose, Bld: 152 mg/dL — ABNORMAL HIGH (ref 70–99)
Potassium: 3.9 mmol/L (ref 3.5–5.1)
Sodium: 135 mmol/L (ref 135–145)

## 2023-06-14 MED ORDER — PROCHLORPERAZINE EDISYLATE 10 MG/2ML IJ SOLN
10.0000 mg | Freq: Four times a day (QID) | INTRAMUSCULAR | Status: DC | PRN
  Administered 2023-06-14: 10 mg via INTRAVENOUS
  Filled 2023-06-14: qty 2

## 2023-06-14 NOTE — Progress Notes (Signed)
 Progress Note   Patient: Annette Foster ZOX:[id number] DOB: 07/01/1940 DOA: 06/04/2023     9 DOS: the patient was seen and examined on 06/14/2023   Brief hospital course:  "83 y.o. 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  She last underwent paracentesis twice in the past 2 weeks on 4/10 and 4/20 with removal of 4.8 And 4.2 L respectively but has had rapid reaccumulation of fluid in spite of compliance with medication.  Patient states that her oncologist referred her to GI for consideration of Pleurx drain but appointment is far away. ED course and data review: Soft BP at 114/55 with otherwise normal vitals Labs with baseline pancytopenia, WBC 2.3, hemoglobin 10.3 and platelets 81.  Creatinine 1.05 from baseline of of 0.82 Lipase and LFTs WNL EKG, personally viewed and interpreted showing dual paced rhythm with occasional PVCs at rate of 74 No imaging done Hospitalist consulted for admission.   4/24.  Patient frustrated that she has recurrent ascites and ends up needing to come back to the hospital.  Paracentesis today removed 4.1 L.  GI made referral to interventional radiology for TIPS procedure for Monday.  Patient had quite a bit of pain after paracentesis. 4/25.  Patient feeling better today.  Not having any abdominal pain that she had last night.  Still need to replace potassium. 4/26.  Patient's abdomen is still very distended.  Looking forward to trying a TIPS procedure to see if this can help out with her recurrent ascites. 4/27.  When I walked in she had the nausea bag in front of her.  Did not eat breakfast.  I  ordered a dose of Phenergan  prior to lunch.  When she gets distended she can eat. 4/28.  Patient feels distended.  Patient had TIPS procedure and 1.5 L with paracentesis. 4/29.  Patient not feeling well today.  Having abdominal pain.  Will give empiric Rocephin .  Will order for another paracentesis for tomorrow and will send off cell counts."   I assumed care on 06/11/23.  4/30 -- U/S today with IR showed no significant ascites.  K 3.4. 5/1 -- pt nauseated this AM requiring IV Zofran . K again 3.4 5/2 -- ongoing abdominal pain and tenderness, no BM since 4/29  Assessment and Plan:  Refractory ascites   Status post TIPS on 06/09/23 with Dr. Marne Sings Paracentesis 4/24 removed 4.1 L.   Paracentesis 4/28 removed 1.5 L After chart review - appears no cell counts were obtained. Reviewed cell counts from 4/19 paracentesis - negative for SBP Repeat U/S on 4/30 showed no significant ascites   Abdominal pain - initially due to above, now seems multifactorial Constipation Persistent, with increased tenderness on exam today.  Non-distended. --Continue spironolactone  and Demadex .   --5/2 Resumed empiric antibiotics (cefotaxime) since we have not adequately ruled out SBP, no cell counts were sent with paracentesis earlier this admission --Increase bowel regimen - titrate for 2-3 soft BM's daily --5/3 REPEAT U/S Ascites & paracentesis if sufficient fluids -- Requested Cell counts and cultures if fluid can be obtained --Monitor abdominal exam for signs of SBP or ascites recurrence --Outpatient IR follow up in 1 month  Nausea -- after oral potassium this AM --IV Zofran  PRN --Monitor  closely  Hypokalemia - Replaced --Repeat BMP in AM  Liver cirrhosis secondary to NASH  Patient with pancytopenia, mild coagulopathy with an INR of 1.6, refractory ascites, history of esophageal varices.  TIPS procedure 4/28. --Continue nadolol  --Continue empiric lactulose   Pancytopenia Secondary liver disease.    --Transfuse as needed for Hbg < 7 or platelets < 50k +bleeding or <10k w/o bleeding --Monitor CBC  Hypothyroidism, unspecified --Continue levothyroxine   Chronic diastolic CHF (congestive heart failure) (HCC) Last EF 55%.   --Torsemide  and nadolol   Hyperlipidemia, unspecified --Continue Zocor   Hypotension --On midodrine   Type 2 diabetes mellitus with complication, with long-term current use of insulin  - Last hemoglobin A1c back in January at 5.6.   Patient does not need 20 units of long-acting insulin .   Can potentially go home on 5 units.   --Sliding scale Novolog    AKI -resolved Cr was as low  as 0.7 on 4/25.  Creatinine has high as 1.09 on 4/26.   --Monitor BMP      Subjective: Patient up in recliner this AM.  She reports ongoing abdominal pain and intermittent nausea but no longer vomiting.  Continues to feel overall poorly.  No fever/chills.   Physical Exam: Vitals:   06/13/23 1253 06/13/23 2142 06/14/23 0311 06/14/23 0724  BP: 102/67 (!) 102/52 (!) 116/44 (!) 108/48  Pulse: 76 73 73 70  Resp: 18 20 20 18   Temp: 97.6 F (36.4 C) 98 F (36.7 C) 98.3 F (36.8 C) 98.2 F (36.8 C)  TempSrc: Oral     SpO2: 99% 97% 96% 98%  Weight:      Height:       General exam: awake, alert, no acute distress HEENT: moist mucus membranes, hearing grossly normal  Respiratory system: on room air, normal respiratory effort. CTAB Cardiovascular system: RRR, no pedal edema.   Gastrointestinal system: diffuse abdominal tenderness on palpation with guarding Central nervous system: A&O x 3. no gross focal neurologic deficits, normal speech Extremities: moves all, no edema, normal tone Skin: dry, intact, normal temperature Psychiatry: normal mood, congruent affect, judgement and insight appear normal     Data Reviewed: Notable labs --   Glucose 152 Ca 8.5 Hbg 10.7 stable Platelets 81 >> 105 improving  CBG's at goal     Family Communication: None present,will attempt  to call as time allows.  5/1 - Called daughter Annette Foster this afternoon but was not able to reach her.  Did not leave unsecured voicemail.   Disposition: Status is: Inpatient Remains inpatient appropriate because: persistent abdominal pain with ongoing evaluation  Anticipate d/c in 24-48 hours if improved.    Planned Discharge Destination: Home health    Time spent: 45 minutes  Author: Montey Apa, DO 06/14/2023 2:30 PM  For on call review [email or web address].

## 2023-06-15 ENCOUNTER — Inpatient Hospital Stay

## 2023-06-15 DIAGNOSIS — R188 Other ascites: Secondary | ICD-10-CM | POA: Diagnosis not present

## 2023-06-15 LAB — BASIC METABOLIC PANEL WITH GFR
Anion gap: 12 (ref 5–15)
BUN: 12 mg/dL (ref 8–23)
CO2: 28 mmol/L (ref 22–32)
Calcium: 8.7 mg/dL — ABNORMAL LOW (ref 8.9–10.3)
Chloride: 96 mmol/L — ABNORMAL LOW (ref 98–111)
Creatinine, Ser: 0.67 mg/dL (ref 0.44–1.00)
GFR, Estimated: >60 mL/min (ref >60)
Glucose, Bld: 139 mg/dL — ABNORMAL HIGH (ref 70–99)
Potassium: 3.4 mmol/L — ABNORMAL LOW (ref 3.5–5.1)
Sodium: 136 mmol/L (ref 135–145)

## 2023-06-15 LAB — HEPATIC FUNCTION PANEL
ALT: 75 U/L — ABNORMAL HIGH (ref 0–44)
AST: 83 U/L — ABNORMAL HIGH (ref 15–41)
Albumin: 2.9 g/dL — ABNORMAL LOW (ref 3.5–5.0)
Alkaline Phosphatase: 73 U/L (ref 38–126)
Bilirubin, Direct: 0.5 mg/dL — ABNORMAL HIGH (ref 0.0–0.2)
Indirect Bilirubin: 1.9 mg/dL — ABNORMAL HIGH (ref 0.3–0.9)
Total Bilirubin: 2.4 mg/dL — ABNORMAL HIGH (ref 0.0–1.2)
Total Protein: 6.2 g/dL — ABNORMAL LOW (ref 6.5–8.1)

## 2023-06-15 LAB — GLUCOSE, CAPILLARY
Glucose-Capillary: 144 mg/dL — ABNORMAL HIGH (ref 70–99)
Glucose-Capillary: 128 mg/dL — ABNORMAL HIGH (ref 70–99)
Glucose-Capillary: 146 mg/dL — ABNORMAL HIGH (ref 70–99)

## 2023-06-15 LAB — AMMONIA: Ammonia: 25 umol/L (ref 9–35)

## 2023-06-15 MED ORDER — POTASSIUM CHLORIDE CRYS ER 20 MEQ PO TBCR
40.0000 meq | EXTENDED_RELEASE_TABLET | Freq: Every day | ORAL | Status: DC
  Administered 2023-06-15 – 2023-06-17 (×3): 40 meq via ORAL
  Filled 2023-06-15 (×3): qty 2

## 2023-06-15 MED ORDER — MIDODRINE HCL 5 MG PO TABS
10.0000 mg | ORAL_TABLET | Freq: Three times a day (TID) | ORAL | Status: DC
  Administered 2023-06-16 – 2023-06-17 (×6): 10 mg via ORAL
  Filled 2023-06-15 (×6): qty 2

## 2023-06-15 MED ORDER — IOHEXOL 350 MG/ML SOLN
100.0000 mL | Freq: Once | INTRAVENOUS | Status: AC | PRN
  Administered 2023-06-15: 75 mL via INTRAVENOUS

## 2023-06-15 MED ORDER — SODIUM CHLORIDE 0.9 % IV SOLN
2.0000 g | INTRAVENOUS | Status: DC
  Administered 2023-06-15 – 2023-06-17 (×3): 2 g via INTRAVENOUS
  Filled 2023-06-15 (×3): qty 20

## 2023-06-15 MED ORDER — LACTULOSE 10 GM/15ML PO SOLN
30.0000 g | Freq: Three times a day (TID) | ORAL | Status: DC
  Administered 2023-06-15 – 2023-06-16 (×4): 30 g via ORAL
  Filled 2023-06-15 (×5): qty 60

## 2023-06-15 NOTE — Progress Notes (Addendum)
 Progress Note   Patient: Annette Foster ZOX:[id number] DOB: [birth year] DOA: 06/04/2023     10 DOS: the patient was seen and examined on 06/15/2023   Brief hospital course:  "83 y.o. 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  She last underwent paracentesis twice in the past 2 weeks on 4/10 and 4/20 with removal of 4.8 And 4.2 L respectively but has had rapid reaccumulation of fluid in spite of compliance with medication.  Patient states that her oncologist referred her to GI for consideration of Pleurx drain but appointment is far away. ED course and data review: Soft BP at 114/55 with otherwise normal vitals Labs with baseline pancytopenia, WBC 2.3, hemoglobin 10.3 and platelets 81.  Creatinine 1.05 from baseline of of 0.82 Lipase and LFTs WNL EKG, personally viewed and interpreted showing dual paced rhythm with occasional PVCs at rate of 74 No imaging done Hospitalist consulted for admission.   4/24.  Patient frustrated that she has recurrent ascites and ends up needing to come back to the hospital.  Paracentesis today removed 4.1 L.  GI made referral to interventional radiology for TIPS procedure for Monday.  Patient had quite a bit of pain after paracentesis. 4/25.  Patient feeling better today.  Not having any abdominal pain that she had last night.  Still need to replace potassium. 4/26.  Patient's abdomen is still very distended.  Looking forward to trying a TIPS procedure to see if this can help out with her recurrent ascites. 4/27.  When I walked in she had the nausea bag in front of her.  Did not eat breakfast.  I  ordered a dose of Phenergan  prior to lunch.  When she gets distended she can eat. 4/28.  Patient feels distended.  Patient had TIPS procedure and 1.5 L with paracentesis. 4/29.  Patient not feeling well today.  Having abdominal pain.  Will give empiric Rocephin .  Will order for another paracentesis for tomorrow and will send off cell counts."   I assumed care on 06/11/23.  4/30 -- U/S today with IR showed no significant ascites.  K 3.4. 5/1 -- pt nauseated this AM requiring IV Zofran . K again 3.4 5/2 -- ongoing abdominal pain and tenderness, no BM since 4/29 5/3 -- persistent abdominal pain, nausea, had BM, U/S no ascites 5/4 -- CTA to further evaluate persistent abdominal pain  Assessment and Plan:  Refractory ascites   Status post TIPS on 06/09/23 with Dr. Marne Sings Paracentesis 4/24 removed 4.1 L.   Paracentesis 4/28 removed 1.5 L After chart review - appears no cell counts were obtained. Reviewed cell counts from 4/19 paracentesis - negative for SBP Repeat U/S on 4/30 showed no significant ascites   Abdominal pain - initially due to above, now seems multifactorial Constipation Persistent, with increased tenderness on exam today.  Non-distended. --Continue spironolactone  and Demadex .   --5/2 Resume empiric antibiotics since we have not adequately ruled out SBP, no cell counts were sent with paracentesis earlier this admission --Increase bowel regimen - titrate for 2-3 soft BM's daily --5/3 REPEAT U/S Ascites & paracentesis if sufficient fluids -- No ascites --5/4 - discussed case with IR & persistent abdominal pain post-TIPS - recommended CTA abdomen/pelvis BRTO protocol --  PENDING --Monitor abdominal exam for signs of SBP or ascites recurrence --Outpatient IR follow up in 1 month  Transaminitis / Hyperbilirubinemia -- T bili 2.4 (up from 1.6 on 4/29), AST 83, ALT 75   Nausea -- after oral potassium this AM --IV Zofran  PRN --Monitor closely  Hypokalemia -  Replaced --Repeat BMP in AM  Liver cirrhosis secondary to NASH  Patient with pancytopenia, mild coagulopathy with an INR of 1.6, refractory ascites, history of esophageal varices.  TIPS procedure 4/28. --Continue nadolol  --Continue empiric lactulose  - goal of 2-3 soft BM's daily --Check ammonia level  Pancytopenia Secondary liver disease.   --Transfuse as needed for Hbg < 7 or platelets < 50k +bleeding or <10k w/o bleeding --Monitor CBC  Hypothyroidism, unspecified --Continue levothyroxine   Chronic diastolic CHF (congestive heart failure) (HCC) Last EF 55%.   --Torsemide  and nadolol   Hyperlipidemia, unspecified --Continue Zocor   Hypotension --On midodrine   Type 2 diabetes mellitus with complication, with long-term current use of insulin  - Last hemoglobin A1c back in January at 5.6.   Patient does not need 20 units of long-acting insulin .   Can potentially go home on 5 units.   --Sliding scale Novolog    AKI -resolved Cr was as low  as 0.7 on 4/25.  Creatinine has high as 1.09 on 4/26.   --Monitor BMP      Subjective: Patient up in recliner this AM, sleeping but woke easily to voice. She reports persistent abdominal pain. Earlier was severely nauseated, given Zofran  and then got sleepy.  Feels little better now but pain is persistent.  Had a BM.   Physical Exam: Vitals:   06/14/23 0724 06/14/23 2041 06/15/23 0556 06/15/23 0927  BP: (!) 108/48 (!) 122/54 122/60 (!) 99/43  Pulse: 70 79 75 73  Resp: 18 20 18 16   Temp: 98.2 F (36.8 C) (!) 97.4 F (36.3 C) 98 F (36.7 C) 97.8 F (36.6 C)  TempSrc:      SpO2: 98% 98%  97%  Weight:      Height:       General exam: awake, drowsy appearing, no acute distress HEENT: moist mucus membranes, hearing grossly normal  Respiratory system: on room air, normal respiratory effort. CTAB Cardiovascular system: RRR, no pedal edema.   Gastrointestinal system: diffuse abdominal tenderness on palpation with guarding Central  nervous system: A&O x 3. no gross focal neurologic deficits, normal speech Extremities: moves all, no edema, normal tone Skin: dry, intact, normal temperature Psychiatry: normal mood, congruent affect, judgement and insight appear normal     Data Reviewed: Notable labs --   K 3.4 Cl 96 Glucose 139 Ca 8.7  Last CBC 5/3 Hbg 10.7 stable Platelets 81 >> 105 improving  CBG's at goal     Family Communication: None present 5/4 - Called daughter Christiane Cowing again this afternoon but was not able to reach her.  The line goes straight to unidentified voicemail box. Did not leave unsecured voicemail.   Disposition: Status is: Inpatient Remains inpatient appropriate because: persistent abdominal pain with ongoing evaluation      Planned Discharge Destination: Home health    Time spent: 42 minutes  Author: Montey Apa, DO 06/15/2023 4:00 PM  For on call review [email or web address].

## 2023-06-16 DIAGNOSIS — R188 Other ascites: Secondary | ICD-10-CM | POA: Diagnosis not present

## 2023-06-16 LAB — MAGNESIUM: Magnesium: 1.9 mg/dL (ref 1.7–2.4)

## 2023-06-16 LAB — CBC
HCT: 32.1 % — ABNORMAL LOW (ref 36.0–46.0)
Hemoglobin: 11.1 g/dL — ABNORMAL LOW (ref 12.0–15.0)
MCH: 31.6 pg (ref 26.0–34.0)
MCHC: 34.6 g/dL (ref 30.0–36.0)
MCV: 91.5 fL (ref 80.0–100.0)
Platelets: 114 10*3/uL — ABNORMAL LOW (ref 150–400)
RBC: 3.51 MIL/uL — ABNORMAL LOW (ref 3.87–5.11)
RDW: 16.6 % — ABNORMAL HIGH (ref 11.5–15.5)
WBC: 6 10*3/uL (ref 4.0–10.5)
nRBC: 0 % (ref 0.0–0.2)

## 2023-06-16 LAB — COMPREHENSIVE METABOLIC PANEL WITH GFR
ALT: 62 U/L — ABNORMAL HIGH (ref 0–44)
AST: 67 U/L — ABNORMAL HIGH (ref 15–41)
Albumin: 3.2 g/dL — ABNORMAL LOW (ref 3.5–5.0)
Alkaline Phosphatase: 77 U/L (ref 38–126)
Anion gap: 12 (ref 5–15)
BUN: 12 mg/dL (ref 8–23)
CO2: 28 mmol/L (ref 22–32)
Calcium: 9 mg/dL (ref 8.9–10.3)
Chloride: 96 mmol/L — ABNORMAL LOW (ref 98–111)
Creatinine, Ser: 0.86 mg/dL (ref 0.44–1.00)
GFR, Estimated: >60 mL/min (ref >60)
Glucose, Bld: 127 mg/dL — ABNORMAL HIGH (ref 70–99)
Potassium: 4.1 mmol/L (ref 3.5–5.1)
Sodium: 136 mmol/L (ref 135–145)
Total Bilirubin: 2.2 mg/dL — ABNORMAL HIGH (ref 0.0–1.2)
Total Protein: 6.5 g/dL (ref 6.5–8.1)

## 2023-06-16 LAB — GLUCOSE, CAPILLARY
Glucose-Capillary: 123 mg/dL — ABNORMAL HIGH (ref 70–99)
Glucose-Capillary: 150 mg/dL — ABNORMAL HIGH (ref 70–99)
Glucose-Capillary: 164 mg/dL — ABNORMAL HIGH (ref 70–99)
Glucose-Capillary: 159 mg/dL — ABNORMAL HIGH (ref 70–99)

## 2023-06-16 LAB — MISC LABCORP TEST (SEND OUT): Labcorp test code: 9985

## 2023-06-16 MED ORDER — BISACODYL 10 MG RE SUPP
10.0000 mg | Freq: Once | RECTAL | Status: AC
  Administered 2023-06-16: 10 mg via RECTAL
  Filled 2023-06-16: qty 1

## 2023-06-16 NOTE — Progress Notes (Signed)
 Physical Therapy Treatment Patient Details Name: Annette Foster MRN: [id number] DOB: Aug 24, 1940 Today's Date: 06/16/2023   History of Present Illness Pt is an 83 yo female admitted for recurrent ascites. Pt underwent TIPs procedure and paracentesis. PMH of afib, HTN, CHF, pacemaker, DM, breast cancer, GERD, hypothyroidism, OSA on CPAP, nonalcoholic liver cirrhosis, with pancytopenia, portal hypertensive gastropathy, grade 2 esophageal varices s/p banding 05/13/2023.    PT Comments  Pt was pleasant and motivated to participate during the session and put forth good effort throughout. Pt required no physical assistance with below functional tasks this session.  Pt required occasional min cuing for general sequencing and was steady with all standing activities.  Pt was able to increase max self-selected gait distance to 150 feet and demonstrated good eccentric and concentric control and stability with stair training/assessment.  Of note pt has decided to discharge to her dtr's house where she will be in a 1-story home with 2 stairs to enter with a right-sided rail.  Pt reported no adverse symptoms during the session other than abdominal pain that did not worsen with activity.  Pt will benefit from continued PT services upon discharge to safely address deficits listed in patient problem list for decreased caregiver assistance and eventual return to PLOF.       If plan is discharge home, recommend the following: A little help with walking and/or transfers;A little help with bathing/dressing/bathroom;Assistance with cooking/housework;Assist for transportation;Help with stairs or ramp for entrance   Can travel by private vehicle        Equipment Recommendations  Rolling walker (2 wheels)    Recommendations for Other Services       Precautions / Restrictions Precautions Precautions: Fall Restrictions Weight Bearing Restrictions Per Provider Order: No     Mobility  Bed Mobility                General bed mobility comments: NT, in recliner pre-post session    Transfers Overall transfer level: Needs assistance Equipment used: Rolling walker (2 wheels) Transfers: Sit to/from Stand Sit to Stand: Supervision           General transfer comment: Min verbal cues for sequencing with good eccentric and concentric control and stability    Ambulation/Gait Ambulation/Gait assistance: Supervision Gait Distance (Feet): 150 Feet, 1 x 30 Feet Assistive device: Rolling walker (2 wheels) Gait Pattern/deviations: Step-through pattern, Decreased step length - right, Decreased step length - left Gait velocity: decreased     General Gait Details: Moderately reduced cadence and step length but steady without LOB   Stairs Stairs: Yes Stairs assistance: Contact guard assist, Supervision Stair Management: One rail Right, Step to pattern, Forwards Number of Stairs: 4 General stair comments: Pt able to ascend 4 steps with one hand on R rail and descend with bilateral hands on the rail with good eccentric and conentric control and stability   Wheelchair Mobility     Tilt Bed    Modified Rankin (Stroke Patients Only)       Balance           Standing balance support: During functional activity, Bilateral upper extremity supported Standing balance-Leahy Scale: Good                              Communication Communication Communication: No apparent difficulties  Cognition Arousal: Alert Behavior During Therapy: WFL for tasks assessed/performed   PT - Cognitive impairments: No apparent impairments  Following commands: Intact      Cueing    Exercises      General Comments        Pertinent Vitals/Pain Pain Assessment Pain Score: 8  Pain Location: abdomen Pain Descriptors / Indicators: Sore, Aching Pain Intervention(s): Repositioned, Monitored during session, Other (comment) (Pt declined pain meds)    Home  Living                          Prior Function            PT Goals (current goals can now be found in the care plan section) Progress towards PT goals: Progressing toward goals    Frequency    Min 2X/week      PT Plan      Co-evaluation              AM-PAC PT "6 Clicks" Mobility   Outcome Measure  Help needed turning from your back to your side while in a flat bed without using bedrails?: None Help needed moving from lying on your back to sitting on the side of a flat bed without using bedrails?: None Help needed moving to and from a bed to a chair (including a wheelchair)?: A Little Help needed standing up from a chair using your arms (e.g., wheelchair or bedside chair)?: A Little Help needed to walk in hospital room?: A Little Help needed climbing 3-5 steps with a railing? : A Little 6 Click Score: 20    End of Session Equipment Utilized During Treatment: Gait belt Activity Tolerance: Patient tolerated treatment well Patient left: in chair;with call bell/phone within reach;with chair alarm set;with family/visitor present Nurse Communication: Mobility status PT Visit Diagnosis: Other abnormalities of gait and mobility (R26.89);Difficulty in walking, not elsewhere classified (R26.2);Muscle weakness (generalized) (M62.81)     Time: 1610-9604 PT Time Calculation (min) (ACUTE ONLY): 34 min  Charges:    $Gait Training: 23-37 mins PT General Charges $$ ACUTE PT VISIT: 1 Visit                    D. Scott Genavieve Mangiapane PT, DPT 06/16/23, 4:07 PM

## 2023-06-16 NOTE — Progress Notes (Signed)
 Mobility Specialist - Progress Note   06/16/23 1105  Mobility  Activity Ambulated with assistance to bathroom  Level of Assistance Standby assist, set-up cues, supervision of patient - no hands on  Assistive Device Front wheel walker  Distance Ambulated (ft) 12 ft  Activity Response Tolerated well  Mobility visit 1 Mobility  Mobility Specialist Start Time (ACUTE ONLY) 1043  Mobility Specialist Stop Time (ACUTE ONLY) 1048  Mobility Specialist Time Calculation (min) (ACUTE ONLY) 5 min   MS responding to call bell, Pt standing in the bathroom upon arrival. Pt amb to the recliner SBA, left seated with alarm set and needs within reach.   Annette Foster Mobility Specialist 06/16/23 11:07 AM

## 2023-06-16 NOTE — Plan of Care (Signed)
  Problem: Education: Goal: Ability to describe self-care measures that may prevent or decrease complications (Diabetes Survival Skills Education) will improve Outcome: Progressing Goal: Individualized Educational Video(s) Outcome: Progressing   Problem: Coping: Goal: Ability to adjust to condition or change in health will improve Outcome: Progressing   Problem: Fluid Volume: Goal: Ability to maintain a balanced intake and output will improve Outcome: Progressing   Problem: Health Behavior/Discharge Planning: Goal: Ability to identify and utilize available resources and services will improve Outcome: Progressing Goal: Ability to manage health-related needs will improve Outcome: Progressing   Problem: Metabolic: Goal: Ability to maintain appropriate glucose levels will improve Outcome: Progressing   Problem: Nutritional: Goal: Maintenance of adequate nutrition will improve Outcome: Progressing Goal: Progress toward achieving an optimal weight will improve Outcome: Progressing   Problem: Skin Integrity: Goal: Risk for impaired skin integrity will decrease Outcome: Progressing   Problem: Tissue Perfusion: Goal: Adequacy of tissue perfusion will improve Outcome: Progressing   Problem: Education: Goal: Knowledge of General Education information will improve Description: Including pain rating scale, medication(s)/side effects and non-pharmacologic comfort measures Outcome: Progressing   Problem: Health Behavior/Discharge Planning: Goal: Ability to manage health-related needs will improve Outcome: Progressing   Problem: Clinical Measurements: Goal: Ability to maintain clinical measurements within normal limits will improve Outcome: Progressing Goal: Will remain free from infection Outcome: Progressing Goal: Diagnostic test results will improve Outcome: Progressing Goal: Respiratory complications will improve Outcome: Progressing Goal: Cardiovascular complication will  be avoided Outcome: Progressing   Problem: Nutrition: Goal: Adequate nutrition will be maintained Outcome: Progressing   Problem: Activity: Goal: Risk for activity intolerance will decrease Outcome: Progressing

## 2023-06-16 NOTE — Progress Notes (Signed)
 Progress Note   Patient: Annette Foster GNF:[id number] DOB: [birth year] DOA: 06/04/2023     11 DOS: the patient was seen and examined on 06/16/2023   Brief hospital course:  "83 y.o. 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  She last underwent paracentesis twice in the past 2 weeks on 4/10 and 4/20 with removal of 4.8 And 4.2 L respectively but has had rapid reaccumulation of fluid in spite of compliance with medication.  Patient states that her oncologist referred her to GI for consideration of Pleurx drain but appointment is far away. ED course and data review: Soft BP at 114/55 with otherwise normal vitals Labs with baseline pancytopenia, WBC 2.3, hemoglobin 10.3 and platelets 81.  Creatinine 1.05 from baseline of of 0.82 Lipase and LFTs WNL EKG, personally viewed and interpreted showing dual paced rhythm with occasional PVCs at rate of 74 No imaging done Hospitalist consulted for admission.   4/24.  Patient frustrated that she has recurrent ascites and ends up needing to come back to the hospital.  Paracentesis today removed 4.1 L.  GI made referral to interventional radiology for TIPS procedure for Monday.  Patient had quite a bit of pain after paracentesis. 4/25.  Patient feeling better today.  Not having any abdominal pain that she had last night.  Still need to replace potassium. 4/26.  Patient's abdomen is still very distended.  Looking forward to trying a TIPS procedure to see if this can help out with her recurrent ascites. 4/27.  When I walked in she had the nausea bag in front of her.  Did not eat breakfast.  I  ordered a dose of Phenergan  prior to lunch.  When she gets distended she can eat. 4/28.  Patient feels distended.  Patient had TIPS procedure and 1.5 L with paracentesis. 4/29.  Patient not feeling well today.  Having abdominal pain.  Will give empiric Rocephin .  Will order for another paracentesis for tomorrow and will send off cell counts."   I assumed care on 06/11/23.  4/30 -- U/S today with IR showed no significant ascites.  K 3.4. 5/1 -- pt nauseated this AM requiring IV Zofran . K again 3.4 5/2 -- ongoing abdominal pain and tenderness, no BM since 4/29 5/3 -- persistent abdominal pain, nausea, had BM, U/S no ascites 5/4 -- CTA to further evaluate persistent abdominal pain  Assessment and Plan:  Refractory ascites   Status post TIPS on 06/09/23 with Dr. Marne Sings Paracentesis 4/24 removed 4.1 L.   Paracentesis 4/28 removed 1.5 L After chart review - appears no cell counts were obtained. Reviewed cell counts from 4/19 paracentesis - negative for SBP Repeat U/S on 4/30 and 5/3 showed no significant ascites   Abdominal pain - initially due to above, now seems multifactorial Persistent with ongoing tenderness on exam.  Non-distended. --Continue spironolactone  and Demadex .   --Resumed on empiric Rocephin  since we have not adequately ruled out SBP, no cell counts were sent with paracentesis earlier this admission --Increasing bowel regimen again today - titrate for 2-3 soft BM's daily --5/3 REPEAT U/S Ascites & paracentesis if sufficient fluids -- No ascites --5/4 - discussed case with IR & persistent abdominal pain post-TIPS - recommended CTA abdomen/pelvis BRTO  protocol -- it showed large stool burden in colon, patent TIPS, no acute findings to explain ongoing pain besides constipation --Monitor abdominal exam --Outpatient IR follow up in 1 month  Constipation - increasing bowel regimen, suppository this AM, use enemas if neeced  Transaminitis / Hyperbilirubinemia -- T bili 2.4  (up from 1.6 on 4/29), AST 83, ALT 75 - improving --Trend LFT's  Nausea -- after oral potassium this AM --IV Zofran  PRN --Monitor closely  Hypokalemia - Replaced --Repeat BMP in AM  Liver cirrhosis secondary to NASH  Patient with pancytopenia, mild coagulopathy with an INR of 1.6, refractory ascites, history of esophageal varices.  TIPS procedure 4/28. --Continue nadolol  --Continue empiric lactulose  - goal of 2-3 soft BM's daily --5/4 ammonia level - normal 25  Pancytopenia Secondary liver disease.   --Transfuse as needed for Hbg < 7 or platelets < 50k +bleeding or <10k w/o bleeding --Monitor CBC  Hypothyroidism, unspecified --Continue levothyroxine   Chronic diastolic CHF (congestive heart failure) (HCC) Last EF 55%.   --Torsemide  and nadolol   Hyperlipidemia, unspecified --Continue Zocor   Hypotension --On midodrine   Type 2 diabetes mellitus with complication, with long-term current use of insulin  - Last hemoglobin A1c back in January at 5.6.   Patient does not need 20 units of long-acting insulin .   Can potentially go home on 5 units.   --Sliding scale Novolog    AKI -resolved Cr was as low  as 0.7 on 4/25.  Creatinine has high as 1.09 on 4/26.   --Monitor BMP      Subjective: Patient up in recliner this AM.  She reports recurrent intermittent nausea and ongoing abdominal pain.  Discussed the CT results from yesterday afternoon showing large stool burden throughout the colon, increasing bowel regimen.   Physical Exam: Vitals:   06/15/23 1733 06/15/23 2032 06/16/23 0340 06/16/23 0847  BP: (!) 93/52 (!) 102/56 (!) 123/52 (!) 110/55  Pulse:  77 79 76  Resp:  18 18 16   Temp:  98 F (36.7 C) 98.5 F (36.9 C) 98 F (36.7 C)  TempSrc:  Oral Oral Oral  SpO2:  99% 99% 100%  Weight:      Height:       General exam: awake, alert, no acute distress HEENT: moist mucus membranes, hearing grossly normal  Respiratory system: on room air, normal respiratory effort.  CTAB Cardiovascular system: RRR, no pedal edema.   Gastrointestinal system: improved tenderness on palpation today only epigastric/RUQ area, abdomen is soft and non-distended Central nervous system: A&O x 3. no gross focal neurologic deficits, normal speech Extremities: moves all, no edema, normal tone Skin: dry, intact, normal temperature Psychiatry: normal mood, congruent affect, judgement and insight appear normal     Data Reviewed: Notable labs --   Cl 96, glucose 127 Albumin   3.2 AST improved 83 >> 67 ALT improved 75 >> 62 Tbili 2.4 >> 2.2  Hbg stable 11.1 Platelets improved 114k  CBG's at goal     Family Communication: None present 5/4 - Called daughter Christiane Cowing again this afternoon but was not able to reach her.  The line goes straight to unidentified voicemail box. Did not leave unsecured voicemail.   Disposition: Status is: Inpatient Remains inpatient appropriate because: persistent abdominal pain with ongoing evaluation, on IV therapies as above      Planned Discharge Destination: Home health    Time spent: 42 minutes  Author: Montey Apa, DO 06/16/2023 2:43 PM  For on call review [email or web address].

## 2023-06-17 DIAGNOSIS — R188 Other ascites: Secondary | ICD-10-CM | POA: Diagnosis not present

## 2023-06-17 LAB — COMPREHENSIVE METABOLIC PANEL WITH GFR
ALT: 47 U/L — ABNORMAL HIGH (ref 0–44)
AST: 55 U/L — ABNORMAL HIGH (ref 15–41)
Albumin: 3.1 g/dL — ABNORMAL LOW (ref 3.5–5.0)
Alkaline Phosphatase: 73 U/L (ref 38–126)
Anion gap: 9 (ref 5–15)
BUN: 13 mg/dL (ref 8–23)
CO2: 27 mmol/L (ref 22–32)
Calcium: 8.6 mg/dL — ABNORMAL LOW (ref 8.9–10.3)
Chloride: 98 mmol/L (ref 98–111)
Creatinine, Ser: 0.73 mg/dL (ref 0.44–1.00)
GFR, Estimated: >60 mL/min (ref >60)
Glucose, Bld: 127 mg/dL — ABNORMAL HIGH (ref 70–99)
Potassium: 3.5 mmol/L (ref 3.5–5.1)
Sodium: 134 mmol/L — ABNORMAL LOW (ref 135–145)
Total Bilirubin: 1.6 mg/dL — ABNORMAL HIGH (ref 0.0–1.2)
Total Protein: 6.2 g/dL — ABNORMAL LOW (ref 6.5–8.1)

## 2023-06-17 LAB — GLUCOSE, CAPILLARY
Glucose-Capillary: 106 mg/dL — ABNORMAL HIGH (ref 70–99)
Glucose-Capillary: 118 mg/dL — ABNORMAL HIGH (ref 70–99)
Glucose-Capillary: 139 mg/dL — ABNORMAL HIGH (ref 70–99)
Glucose-Capillary: 140 mg/dL — ABNORMAL HIGH (ref 70–99)
Glucose-Capillary: 155 mg/dL — ABNORMAL HIGH (ref 70–99)
Glucose-Capillary: 179 mg/dL — ABNORMAL HIGH (ref 70–99)

## 2023-06-17 MED ORDER — SENNOSIDES-DOCUSATE SODIUM 8.6-50 MG PO TABS: 1.0000 | ORAL_TABLET | Freq: Every day | ORAL | Status: DC

## 2023-06-17 MED ORDER — LACTULOSE 10 GM/15ML PO SOLN: 30.0000 g | Freq: Every day | ORAL | Status: DC

## 2023-06-17 NOTE — TOC Progression Note (Signed)
 Transition of Care [facility]) - Progression Note    Patient Details  Name: Dinara Scianna MRN: [id number] Date of Birth: 1940/06/04  [facility]) CM/SW Contact  Loman Risk, RN Phone Number: 06/17/2023, 1:57 PM  Clinical Narrative:     Plan remains for patient to discharge home with home health services through [facility] Patient states that she will be staying with her daughter at [address] [place]. Cory with [employer] notified DME has already been delivered to room       Expected Discharge Plan and Services                                               Social Determinants of Health (SDOH) Interventions SDOH Screenings   Food Insecurity: No Food Insecurity (06/06/2023)  Housing: Low Risk  (06/06/2023)  Transportation Needs: No Transportation Needs (06/06/2023)  Utilities: Not At Risk (06/06/2023)  Financial Resource Strain: Medium Risk (05/02/2023)   Received from [facility]  Physical Activity: Unknown (01/30/2018)   Received from [facility], [facility]  Social Connections: Moderately Integrated (06/06/2023)  Tobacco Use: Low Risk  (06/09/2023)    Readmission Risk Interventions     No data to display

## 2023-06-17 NOTE — Plan of Care (Signed)
  Problem: Coping: Goal: Ability to adjust to condition or change in health will improve Outcome: Progressing   Problem: Coping: Goal: Level of anxiety will decrease Outcome: Progressing   Problem: Pain Managment: Goal: General experience of comfort will improve and/or be controlled Outcome: Progressing

## 2023-06-17 NOTE — Progress Notes (Signed)
 Mobility Specialist - Progress Note   06/17/23 1106  Mobility  Activity Ambulated with assistance in room;Ambulated with assistance in hallway  Level of Assistance Standby assist, set-up cues, supervision of patient - no hands on  Assistive Device Front wheel walker  Distance Ambulated (ft) 160 ft  Activity Response Tolerated well  Mobility visit 1 Mobility  Mobility Specialist Start Time (ACUTE ONLY) 1054  Mobility Specialist Stop Time (ACUTE ONLY) 1105  Mobility Specialist Time Calculation (min) (ACUTE ONLY) 11 min   Pt sitting on the commode upon entry, utilizing RA. Pt motivated and agreeable to amb within the hallway this date. Pt STS to RW and amb one lap around the NS with supervision-- slow, steady gait. Pt reported feeling better this date in comparison to previous dates. Pt returned to the room, left seated EOB with needs within reach. NT notified.  Versa Gore Mobility Specialist 06/17/23 11:21 AM

## 2023-06-17 NOTE — Progress Notes (Addendum)
 Progress Note   Patient: Annette Foster [id number] DOB: [birth year] DOA: 06/04/2023     12 DOS: the patient was seen and examined on 06/17/2023   Brief hospital course:  "83 y.o. 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  She last underwent paracentesis twice in the past 2 weeks on 4/10 and 4/20 with removal of 4.8 And 4.2 L respectively but has had rapid reaccumulation of fluid in spite of compliance with medication.  Patient states that her oncologist referred her to GI for consideration of Pleurx drain but appointment is far away. ED course and data review: Soft BP at 114/55 with otherwise normal vitals Labs with baseline pancytopenia, WBC 2.3, hemoglobin 10.3 and platelets 81.  Creatinine 1.05 from baseline of of 0.82 Lipase and LFTs WNL EKG, personally viewed and interpreted showing dual paced rhythm with occasional PVCs at rate of 74 No imaging done Hospitalist consulted for admission.   4/24.  Patient frustrated that she has recurrent ascites and ends up needing to come back to the hospital.  Paracentesis today removed 4.1 L.  GI made referral to interventional radiology for TIPS procedure for Monday.  Patient had quite a bit of pain after paracentesis. 4/25.  Patient feeling better today.  Not having any abdominal pain that she had last night.  Still need to replace potassium. 4/26.  Patient's abdomen is still very distended.  Looking forward to trying a TIPS procedure to see if this can help out with her recurrent ascites. 4/27.  When I walked in she had the nausea bag in front of her.  Did not eat breakfast.  I  ordered a dose of Phenergan  prior to lunch.  When she gets distended she can eat. 4/28.  Patient feels distended.  Patient had TIPS procedure and 1.5 L with paracentesis. 4/29.  Patient not feeling well today.  Having abdominal pain.  Will give empiric Rocephin .  Will order for another paracentesis for tomorrow and will send off cell counts."   4/30 -- U/S today with IR showed no significant ascites.  K 3.4. 5/1 -- pt nauseated this AM requiring IV Zofran . K again 3.4 5/2 -- ongoing abdominal pain and tenderness, no BM since 4/29 5/3 -- persistent abdominal pain, nausea, had BM, U/S no ascites 5/4 -- CTA to further evaluate persistent abdominal pain  Assessment and Plan:  Refractory ascites   Status post TIPS on 06/09/23 with Dr. Marne Sings Paracentesis 4/24 removed 4.1 L.   Paracentesis 4/28 removed 1.5 L After chart review - appears no cell counts were obtained. Reviewed cell counts from 4/19 paracentesis - negative for SBP Repeat U/S on 4/30 and 5/3 showed no significant ascites   Abdominal pain - Improved with multiple bowel movements  --Continue spironolactone  and Demadex .   --Resumed on empiric Rocephin  since we have not adequately ruled out SBP, no cell counts were sent with paracentesis earlier this admission --5/3 REPEAT U/S -- No ascites --5/4 - discussed case with IR & persistent abdominal pain post-TIPS - recommended CTA abdomen/pelvis BRTO protocol -- it showed large stool burden in colon, patent TIPS, no acute findings to explain ongoing pain besides constipation --Given improvement in symptoms with Bms and inadequate ascites for paracentesis will d/c  antibiotics  --Monitor abdominal exam --Outpatient IR follow up in 1 month  Constipation - Multiple Bms will decrease bowel regimen   Transaminitis / Hyperbilirubinemia -- Improving.  --Trend LFT's  Nausea -- Resolved --IV Zofran  PRN --Monitor closely  Hypokalemia - Resolved  --Repeat BMP in AM  Liver cirrhosis  secondary to NASH  Patient with pancytopenia, mild coagulopathy with an INR of 1.6, refractory ascites, history of esophageal varices.  TIPS procedure 4/28. --Continue nadolol  --Continue empiric lactulose  - goal of 2-3 soft BM's daily, titrate as needed  --5/4 ammonia level - normal 25  Pancytopenia Secondary liver disease.   --Transfuse as needed for Hbg < 7 or platelets < 50k +bleeding or <10k w/o bleeding --Monitor CBC  Hypothyroidism, unspecified --Continue levothyroxine   Chronic diastolic CHF (congestive heart failure) (HCC) Last EF 55%.   --Torsemide  and nadolol   Hyperlipidemia, unspecified --Continue Zocor   Hypotension --On midodrine ,  blood pressures in low 100s systolic   Type 2 diabetes mellitus with complication, with long-term current use of insulin  - Last hemoglobin A1c back in January at 5.6.   Patient does not need 20 units of long-acting insulin .   Can potentially go home on 5 units.   --Sliding scale Novolog    AKI -resolved Cr was as low  as 0.7 on 4/25.  Creatinine has high as 1.09 on 4/26.   --Monitor BMP      Subjective: Abdominal pain is improved.    Physical Exam: Vitals:   06/16/23 2017 06/17/23 0359 06/17/23 0831 06/17/23 1616  BP: (!) 140/59 (!) 110/54 107/67 (!) 109/55  Pulse: 72 70 74 76  Resp: 18 18 14 16   Temp: 98.8 F (37.1 C) 97.8 F (36.6 C) 97.7 F (36.5 C) 98 F (36.7 C)  TempSrc: Oral Oral Oral Oral  SpO2: 99% 95% 100% 100%  Weight:      Height:        Physical Exam  Constitutional: In no distress.  Cardiovascular: Normal rate, regular rhythm. No lower extremity edema  Pulmonary: Non labored breathing on room air, no wheezing or rales.   Abdominal: Soft. Normal bowel sounds. Non distended and mild TTP diffusely Musculoskeletal: Normal range of motion.     Neurological: Alert and oriented to person, place, and time. Non focal  Skin: Skin is warm and dry.     Data Reviewed:    Latest Ref Rng & Units 06/17/2023     5:27 AM 06/16/2023    4:18 AM 06/15/2023    5:08 AM  CMP  Glucose 70 - 99 mg/dL 191  478  295   BUN 8 - 23 mg/dL 13  12  12    Creatinine 0.44 - 1.00 mg/dL 6.21  3.08  6.57   Sodium 135 - 145 mmol/L 134  136  136   Potassium 3.5 - 5.1 mmol/L 3.5  4.1  3.4   Chloride 98 - 111 mmol/L 98  96  96   CO2 22 - 32 mmol/L 27  28  28    Calcium 8.9 - 10.3 mg/dL 8.6  9.0  8.7   Total Protein 6.5 - 8.1 g/dL 6.2  6.5  6.2   Total Bilirubin 0.0 - 1.2 mg/dL 1.6  2.2  2.4   Alkaline Phos 38 - 126 U/L 73  77  73   AST 15 - 41 U/L 55  67  83   ALT 0 - 44 U/L 47  62  75         Family Communication: None present  Disposition: Status is: Inpatient Remains inpatient appropriate because: persistent abdominal pain with ongoing evaluation, on IV therapies as above    Planned Discharge Destination: Home health    Time spent: 35 minutes  Author: Joette Mustard, MD 06/17/2023 8:27 PM  For on call review [email or web address].

## 2023-06-18 ENCOUNTER — Inpatient Hospital Stay: Admitting: Internal Medicine

## 2023-06-18 ENCOUNTER — Inpatient Hospital Stay: Attending: Internal Medicine

## 2023-06-18 ENCOUNTER — Inpatient Hospital Stay: Admitting: Hospice and Palliative Medicine

## 2023-06-18 ENCOUNTER — Inpatient Hospital Stay

## 2023-06-18 DIAGNOSIS — R188 Other ascites: Secondary | ICD-10-CM | POA: Diagnosis not present

## 2023-06-18 DIAGNOSIS — K7581 Nonalcoholic steatohepatitis (NASH): Secondary | ICD-10-CM | POA: Diagnosis not present

## 2023-06-18 DIAGNOSIS — R1010 Upper abdominal pain, unspecified: Secondary | ICD-10-CM

## 2023-06-18 DIAGNOSIS — E876 Hypokalemia: Secondary | ICD-10-CM | POA: Diagnosis not present

## 2023-06-18 LAB — COMPREHENSIVE METABOLIC PANEL WITH GFR
ALT: 39 U/L (ref 0–44)
AST: 45 U/L — ABNORMAL HIGH (ref 15–41)
Albumin: 3 g/dL — ABNORMAL LOW (ref 3.5–5.0)
Alkaline Phosphatase: 73 U/L (ref 38–126)
Anion gap: 7 (ref 5–15)
BUN: 15 mg/dL (ref 8–23)
CO2: 27 mmol/L (ref 22–32)
Calcium: 8.7 mg/dL — ABNORMAL LOW (ref 8.9–10.3)
Chloride: 99 mmol/L (ref 98–111)
Creatinine, Ser: 0.69 mg/dL (ref 0.44–1.00)
GFR, Estimated: >60 mL/min (ref >60)
Glucose, Bld: 121 mg/dL — ABNORMAL HIGH (ref 70–99)
Potassium: 3.3 mmol/L — ABNORMAL LOW (ref 3.5–5.1)
Sodium: 133 mmol/L — ABNORMAL LOW (ref 135–145)
Total Bilirubin: 1.5 mg/dL — ABNORMAL HIGH (ref 0.0–1.2)
Total Protein: 6.3 g/dL — ABNORMAL LOW (ref 6.5–8.1)

## 2023-06-18 LAB — GLUCOSE, CAPILLARY: Glucose-Capillary: 166 mg/dL — ABNORMAL HIGH (ref 70–99)

## 2023-06-18 MED ORDER — LACTULOSE 10 GM/15ML PO SOLN: 30.0000 g | Freq: Every day | ORAL | 0 refills | Status: DC

## 2023-06-18 MED ORDER — INSULIN ASPART 100 UNIT/ML FLEXPEN
2.0000 [IU] | PEN_INJECTOR | Freq: Three times a day (TID) | SUBCUTANEOUS | Status: DC
Start: 2023-06-18 — End: 2023-08-03

## 2023-06-18 MED ORDER — ONDANSETRON HCL 4 MG PO TABS: 4.0000 mg | ORAL_TABLET | Freq: Four times a day (QID) | ORAL | 0 refills | Status: DC | PRN

## 2023-06-18 MED ORDER — SPIRONOLACTONE 50 MG PO TABS: 50.0000 mg | ORAL_TABLET | Freq: Every day | ORAL | 0 refills | Status: DC

## 2023-06-18 MED ORDER — TRESIBA FLEXTOUCH 200 UNIT/ML ~~LOC~~ SOPN: 6.0000 [IU] | PEN_INJECTOR | Freq: Every day | SUBCUTANEOUS | Status: DC

## 2023-06-18 MED ORDER — MIDODRINE HCL 10 MG PO TABS: 10.0000 mg | ORAL_TABLET | Freq: Three times a day (TID) | ORAL | 0 refills | Status: DC

## 2023-06-18 NOTE — TOC Transition Note (Signed)
 Transition of Care [facility]) - Discharge Note   Patient Details  Name: Annette Foster MRN: [id number] Date of Birth: 20-Jun-1940  Transition [facility]) CM/SW Contact:  Loman Risk, RN Phone Number: 06/18/2023, 9:02 AM   Clinical Narrative:     Patient to discharge today Randel Buss with [facility] notified of discharge Daughter to transport at discharge   Final next level of care: Home w Home Health Services     Patient Goals and CMS Choice            Discharge Placement                       Discharge Plan and Services Additional resources added to the After Visit Summary for                                       Social Drivers of Health (SDOH) Interventions SDOH Screenings   Food Insecurity: No Food Insecurity (06/06/2023)  Housing: Low Risk  (06/06/2023)  Transportation Needs: No Transportation Needs (06/06/2023)  Utilities: Not At Risk (06/06/2023)  Financial Resource Strain: Medium Risk (05/02/2023)   Received from [facility]  Physical Activity: Unknown (01/30/2018)   Received from [facility], [facility]  Social Connections: Moderately Integrated (06/06/2023)  Tobacco Use: Low Risk  (06/09/2023)     Readmission Risk Interventions     No data to display

## 2023-06-18 NOTE — Discharge Summary (Signed)
 Physician Discharge Summary   Patient: Annette Foster MRN: [id number] DOB: 03-28-1940  Admit date:     06/04/2023  Discharge date: 06/18/23  Discharge Physician: Verla Glaze   PCP: Westley Hammers, MD   Recommendations at discharge:   Follow-up PCP 5 days Follow-up with gastroenterology 2 weeks Follow-up interventional radiology  Discharge Diagnoses: Principal Problem:   Refractory ascites Active Problems:   Abdominal pain   Hypokalemia   Pancytopenia (HCC)   Liver cirrhosis secondary to NASH (HCC)   Hypothyroidism, unspecified   Chronic diastolic CHF (congestive heart failure) (HCC)   Hyperlipidemia, unspecified   AKI (acute kidney injury) (HCC)   Type 2 diabetes mellitus with complication, with long-term current use of insulin  (HCC)   Hypotension    Hospital Course: 83 y.o. 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  She last underwent paracentesis twice in the past 2 weeks on 4/10 and 4/20 with removal of 4.8 And 4.2 L respectively but has had rapid reaccumulation of fluid in spite of compliance with medication.  Patient states that her oncologist referred her to GI for consideration of Pleurx drain but appointment is far away. ED course and data review: Soft BP at 114/55 with otherwise normal vitals Labs with baseline pancytopenia, WBC 2.3, hemoglobin 10.3 and platelets 81.  Creatinine 1.05 from baseline of of 0.82 Lipase and LFTs WNL EKG, personally viewed and interpreted showing dual paced rhythm with occasional PVCs at rate of 74 No imaging done Hospitalist consulted for admission.  4/24.   Patient frustrated that she has recurrent ascites and ends up needing to come back to the hospital.  Paracentesis today removed 4.1 L.  GI made referral to interventional radiology for TIPS procedure for Monday.  Patient had quite a bit of pain after paracentesis. 4/25.  Patient feeling better today.  Not having any abdominal pain that she had last night.  Still need to replace potassium. 4/26.  Patient's abdomen is still very distended.  Looking forward to trying a TIPS procedure to see if this can help out with her recurrent ascites. 4/27.  When I walked in she had the nausea bag in front of her.  Did not eat breakfast.  I ordered a dose of Phenergan  prior to lunch.  When she gets distended she can eat. 4/28.  Patient feels distended.  Patient had TIPS procedure and 1.5 L with paracentesis. 4/29.  Patient not feeling well today.  Having abdominal pain.  Will give empiric Rocephin .  Will order for another paracentesis for tomorrow and will send off cell counts.  4/30 -- U/S today with IR showed no significant ascites.  K 3.4. 5/1 -- pt nauseated this AM requiring IV Zofran . K again 3.4 5/2 -- ongoing abdominal pain and tenderness, no BM since 4/29 5/3 -- persistent abdominal pain, nausea, had BM, U/S no ascites 5/4 -- CTA showed constipation as likely cause of her abdominal pain  5/7.  Patient feeling better and eating little bit more.  Advised that she may need to eat throughout the day since she is only able to eat a little at a time.   Assessment and Plan: * Refractory ascites Paracentesis 4/24 removed 4.1 L of fluid.  Continue spironolactone   and Demadex .  TIPS procedure done 4/28 along with another paracentesis of 1.5 L.  Patient was given empiric course of Rocephin  after the TIPS procedure because she was not feeling well.  Repeat ultrasound on 4/30 did not show enough fluid to be drawn off.  Abdominal pain Patient was given empiric course of Rocephin .  Patient had a CT scan showing  constipation which was likely the cause of her abdominal pain.  Hypokalemia Replaced.  Continue spironolactone .  Liver cirrhosis secondary to NASH [facility]) Patient with pancytopenia, mild coagulopathy with an INR of 1.6, refractory ascites, history of esophageal varices.  Continue nadolol .  TIPS procedure 4/28.  Patient also started on empiric lactulose  after TIPS procedure.  Pancytopenia (HCC) Secondary liver disease, improved after TIPS procedure..  Last white blood cell count 6.0.  Last hemoglobin 11.1.  Last platelet count 114.  Hypothyroidism, unspecified Continue levothyroxine   Chronic diastolic CHF (congestive heart failure) (HCC) Last EF 55%.  Torsemide  and nadolol   Hyperlipidemia, unspecified Continue Zocor   Hypotension On midodrine   Type 2 diabetes mellitus with complication, with long-term current use of insulin  (HCC) Last hemoglobin A1c back in January at 5.6.  Patient does not need 20 units of long-acting insulin .  Can go home on 5 units long-acting insulin  and sliding scale.  AKI (acute kidney injury) (HCC) Cr was as low  as 0.69 on 5/7.  Creatinine has high as 1.09 on 4/26.           Consultants: Gastroenterology, interventional radiology Procedures performed: TIPS Disposition: Home Diet recommendation:  Regular diet DISCHARGE MEDICATION: Allergies as of 06/18/2023       Reactions   Codeine Itching, Nausea And Vomiting, Other (See Comments)   Demeclocycline Rash   Demerol [meperidine] Itching, Nausea And Vomiting   Hydrocodone Itching, Nausea And Vomiting   Morphine  Nausea Only   Other Other (See Comments)   Oxycodone  Itching, Nausea And Vomiting   Pentazocine Itching, Nausea And Vomiting, Other (See Comments)   Tetracyclines & Related Rash   Coal Tar Extract Other (See Comments)   Hydrocodone-acetaminophen  Other (See Comments)   Salicylic Acid Other (See Comments)   Tetracycline Hcl Other (See Comments)        Medication List     STOP taking  these medications    dicyclomine  10 MG capsule Commonly known as: BENTYL    docusate sodium  100 MG capsule Commonly known as: COLACE   Mounjaro  15 MG/0.5ML Pen Generic drug: tirzepatide    saccharomyces boulardii 250 MG capsule Commonly known as: FLORASTOR       TAKE these medications    albuterol  108 (90 Base) MCG/ACT inhaler Commonly known as: VENTOLIN  HFA Inhale 2 puffs into the lungs every 4 (four) hours as needed for wheezing or shortness of breath.   clobetasol  ointment 0.05 % Commonly known as: TEMOVATE  Apply 1 application topically as needed (vaginal irritation).   clotrimazole  1 % cream Commonly known as: LOTRIMIN  Apply 1 Application topically 2 (two) times daily as needed.   cyanocobalamin 1000 MCG/ML injection Commonly known as: VITAMIN B12 1,000 mcg every 30 (thirty) days.   esomeprazole 40 MG capsule Commonly known as: NEXIUM Take 40 mg by mouth 2 (two) times daily before a meal.   fenofibrate  160 MG tablet Take 160 mg by mouth at bedtime.   gabapentin  300 MG capsule Commonly known as: NEURONTIN  Take 300 mg by mouth 2 (two) times daily.   insulin  aspart 100 UNIT/ML FlexPen Commonly known as: NOVOLOG  Inject 2 Units into the skin 3 (three) times  daily with meals. If eating and Blood Glucose (BG) 80 or higher inject 2 units for meal coverage and add correction dose per scale. If not eating, correction dose only. BG <150= 0 unit; BG 150-200= 1 unit; BG 201-250= 2 unit; BG 251-300= 3 unit; BG 301-350= 4 unit; BG 351-400= 5 unit; BG >400= 6 unit and Call Primary care. What changed:  how much to take additional instructions   ipratropium 0.03 % nasal spray Commonly known as: ATROVENT Place 1 spray into both nostrils 2 (two) times daily as needed for rhinitis.   lactulose  10 GM/15ML solution Commonly known as: CHRONULAC  Take 45 mLs (30 g total) by mouth daily.   levocetirizine 5 MG tablet Commonly known as: XYZAL Take 5 mg by mouth every evening.    levothyroxine  75 MCG tablet Commonly known as: SYNTHROID  Take 75 mcg by mouth daily before breakfast.   magnesium  oxide 400 MG tablet Commonly known as: MAG-OX Take 400 mg by mouth 2 (two) times daily.   midodrine  10 MG tablet Commonly known as: PROAMATINE  Take 1 tablet (10 mg total) by mouth 3 (three) times daily with meals. What changed:  medication strength how much to take   nadolol  20 MG tablet Commonly known as: CORGARD  Take 1 tablet (20 mg total) by mouth daily.   ondansetron  4 MG tablet Commonly known as: ZOFRAN  Take 1 tablet (4 mg total) by mouth every 6 (six) hours as needed for nausea. What changed:  medication strength how much to take how to take this when to take this reasons to take this additional instructions   simvastatin  20 MG tablet Commonly known as: ZOCOR  Take 20 mg by mouth at bedtime.   spironolactone  50 MG tablet Commonly known as: ALDACTONE  Take 1 tablet (50 mg total) by mouth daily. What changed:  medication strength how much to take   torsemide  20 MG tablet Commonly known as: DEMADEX  Take 40 mg by mouth daily.   traMADol  50 MG tablet Commonly known as: ULTRAM  Take 1 tablet (50 mg total) by mouth every 8 (eight) hours as needed. What changed: reasons to take this   Tresiba  FlexTouch 200 UNIT/ML FlexTouch Pen Generic drug: insulin  degludec Inject 6 Units into the skin at bedtime. What changed:  how much to take how to take this   valACYclovir  500 MG tablet Commonly known as: VALTREX  Take 500 mg by mouth 2 (two) times daily as needed.   Vitamin D  50 MCG (2000 UT) Caps Take 2,000 Units by mouth daily.        Follow-up Information     Westley Hammers, MD. Go on 06/25/2023.   Specialty: Internal Medicine Why: Go @ 11:00am. Contact information: [address]   [place] [address] [phone]         Shane Darling, MD Follow up in 1 week(s).   Specialty: Gastroenterology Why: The office will call you  to schedule your follow up. Contact information: [address] [place] [address] [phone]                Discharge Exam: Annette Foster Weights   06/04/23 1726  Weight: 68.5 kg   Physical Exam HENT:     Head: Normocephalic.  Eyes:     General: Lids are normal.     Conjunctiva/sclera: Conjunctivae normal.  Cardiovascular:     Rate and Rhythm: Normal rate and regular rhythm.     Heart sounds: Normal heart sounds, S1 normal and S2 normal.  Pulmonary:  Breath sounds: Examination of the right-lower field reveals decreased breath sounds. Examination of the left-lower field reveals decreased breath sounds. Decreased breath sounds present. No wheezing, rhonchi or rales.  Abdominal:     General: There is distension.     Palpations: Abdomen is soft.     Tenderness: There is no abdominal tenderness.  Musculoskeletal:     Right lower leg: No swelling.     Left lower leg: No swelling.  Skin:    General: Skin is warm.     Findings: No rash.  Neurological:     Mental Status: She is alert and oriented to person, place, and time.      Condition at discharge: stable  The results of significant diagnostics from this hospitalization (including imaging, microbiology, ancillary and laboratory) are listed below for reference.   Imaging Studies: CT ANGIO ABD/PELVIS BRTO Result Date: 06/15/2023 CLINICAL DATA:  Abdominal pain post tips. EXAM: CTA ABDOMEN AND PELVIS WITHOUT AND WITH CONTRAST TECHNIQUE: Multidetector CT imaging of the abdomen and pelvis was performed using the standard protocol during bolus administration of intravenous contrast. Multiplanar reconstructed images and MIPs were obtained and reviewed to evaluate the vascular anatomy. RADIATION DOSE REDUCTION: This exam was performed according to the departmental dose-optimization program which includes automated exposure control, adjustment of the mA and/or kV according to patient size and/or use of iterative  reconstruction technique. CONTRAST:  75mL OMNIPAQUE  IOHEXOL  350 MG/ML SOLN COMPARISON:  CT abdomen and pelvis 05/21/2023 FINDINGS: VASCULAR Aorta: Normal caliber aorta without aneurysm, dissection, vasculitis or significant stenosis. There are atherosclerotic calcifications of the aorta. Celiac: Patent without evidence of aneurysm, dissection, vasculitis or significant stenosis. SMA: Patent without evidence of aneurysm, dissection, vasculitis or significant stenosis. Renals: Both renal arteries are patent without evidence of aneurysm, dissection, vasculitis, fibromuscular dysplasia or significant stenosis. Accessory right renal artery present. IMA: Patent without evidence of aneurysm, dissection, vasculitis or significant stenosis. Inflow: Patent without evidence of aneurysm, dissection, vasculitis or significant stenosis. Proximal Outflow: Bilateral common femoral and visualized portions of the superficial and profunda femoral arteries are patent without evidence of aneurysm, dissection, vasculitis or significant stenosis. Veins: IVC is normal in size. New tips catheter present in the left lobe of the liver. The catheter appears grossly patent. There is normal flow within the bilateral portal veins, main portal vein, splenic vein and superior mesenteric vein. Review of the MIP images confirms the above findings. NON-VASCULAR Lower chest: There is some interstitial opacities and ground-glass opacities in the lung bases. Hepatobiliary: Nodular liver contours compatible with cirrhosis. The gallbladder is surgically absent. There is no biliary ductal dilatation. Pancreas: Unremarkable. No pancreatic ductal dilatation or surrounding inflammatory changes. Spleen: Spleen is mildly enlarged, unchanged. Adrenals/Urinary Tract: Adrenal glands are unremarkable. Kidneys are normal, without renal calculi, focal lesion, or hydronephrosis. Bladder is unremarkable. Stomach/Bowel: Stomach is within normal limits. No evidence of  bowel wall thickening, distention, or inflammatory changes. There is a large amount of stool throughout the entire colon. Air-fluid level is noted in the stomach. The appendix is not seen. Lymphatic: No enlarged lymph nodes are identified. Reproductive: Status post hysterectomy. No adnexal masses. Other: No abdominal wall hernia or abnormality. No abdominopelvic ascites. Musculoskeletal: Degenerative changes affect the spine. IMPRESSION: 1. No acute localizing process in the abdomen or pelvis. 2. New tips catheter in place. The catheter appears grossly patent. 3. Cirrhosis with splenomegaly. 4. Large amount of stool throughout the colon. 5. Interstitial and ground-glass opacities in the lung bases, possibly edema. Aortic Atherosclerosis (ICD10-I70.0). Electronically  Signed   By: Tyron Gallon M.D.   On: 06/15/2023 18:52   US  ASCITES (ABDOMEN LIMITED) Result Date: 06/14/2023 CLINICAL DATA:  Abdominal pain. EXAM: LIMITED ABDOMEN ULTRASOUND FOR ASCITES TECHNIQUE: Limited ultrasound survey for ascites was performed in all four abdominal quadrants. COMPARISON:  None Available. FINDINGS: No ascites was demonstrated. IMPRESSION: No ascites. Electronically Signed   By: Sydell Eva M.D.   On: 06/14/2023 18:17   US  Abdomen Limited Result Date: 06/11/2023 CLINICAL DATA:  NASH cirrhosis, history of recurrent ascites now status post TIPS creation. Assess for drainable ascites. EXAM: LIMITED ABDOMEN ULTRASOUND FOR ASCITES TECHNIQUE: Limited ultrasound survey for ascites was performed in all four abdominal quadrants. COMPARISON:  Paracentesis 06/09/2023 FINDINGS: Sonographic interrogation of the abdomen demonstrates no evidence of ascites. IMPRESSION: Negative for ascites. Electronically Signed   By: Fernando Hoyer M.D.   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 She  also has a history of esophageal varices status post prior banding procedures. She presents for elective TIPS creation. EXAM: 1. Ultrasound-guided paracentesis 2. TIPS creation utilizing intracardiac echocardiography (ICE) as intravascular ultrasound guidance MEDICATIONS: In patient already receiving IV Rocephin . No additional prophylaxis administered. ANESTHESIA/SEDATION: General - as administered by the Anesthesia department CONTRAST:  100 mL Omnipaque  300 FLUOROSCOPY TIME:  Radiation exposure index: 746 mGy reference air kerma COMPLICATIONS: None immediate. PROCEDURE: Informed written consent was obtained from the patient after a thorough discussion of the procedural risks, benefits and alternatives. All questions were addressed. Maximal Sterile Barrier Technique was utilized including caps, mask, sterile gowns, sterile gloves, sterile drape, hand hygiene and skin antiseptic. A timeout was performed prior to the initiation of the procedure. The right upper quadrant was interrogated with ultrasound. There is a relatively small volume ascites in the perihepatic station. A suitable skin entry site was selected. A smaller the was made. Under real-time ultrasound guidance, a 6 French Safe-T-Centesis catheter was advanced into the perihepatic fluid collection. Paracentesis was then performed yielding approximately 1.5 L of golden the ascites. The right common femoral vein was interrogated with ultrasound and found to be widely patent. An image was obtained and stored for the medical record. Local anesthesia was attained by infiltration with 1% lidocaine . A small dermatotomy was made. Under real-time sonographic guidance, the vessel was punctured with a 21 gauge micropuncture needle. Using standard technique, the initial micro needle was exchanged over a 0.018 micro wire for a transitional 4 [place] micro sheath. The micro sheath was then exchanged over a 0.035 wire for 8 French vascular sheath which was advanced into  the inferior vena cava. The 8 French intracardiac echocardiography catheter was then advanced through the sheath and positioned in the inferior vena cava within the hepatic segment. Intravascular ultrasound was then used to identify the portal venous anatomy. The right internal jugular vein was interrogated with ultrasound and found to be widely patent. An image was obtained and stored for the medical record. Local anesthesia was attained by infiltration with 1% lidocaine . A small dermatotomy was made. Under real-time sonographic guidance, the vessel was punctured with a 21 gauge micropuncture needle. Using standard technique, the initial micro needle was exchanged over a 0.018 micro wire for a transitional 4 [place] micro sheath. The micro sheath was then exchanged over a 0.035 wire for a 10 French dilator in the percutaneous tract was dilated. A 10 French cook flexor sheath was then advanced over  the wire and into the inferior vena cava. An angled MPA catheter was advanced through the sheath and used to select several hepatic veins. Hepatic venography was performed. Of the middle and right hepatic veins were catheterized and venography performed. Initially, the catheter was parked in the right hepatic vein and an Amplatz wire advanced. The catheter was exchanged in the tips sheath advanced over the wire. The scorpion set was then advanced over the wire. A single initial pass was attempted, however the angle was suboptimal in the portal vein could not be accessed. The scorpion set was removed in the catheter was used to select the middle hepatic vein. The Amplatz wire was advanced and parked. The 10 French flexor sheath was advanced into the vein followed by the scorpion set. This time, on a single pass the scorpion device was successfully advanced into the portal vein. Contrast injection confirms puncture of the central left portal vein. The Glidewire Advantage was advanced into the splenic vein. The scorpion set  was exchanged for a marker pigtail catheter. A portal venogram was performed. Confirmation of puncture of the central left portal system. Several small collaterals are evident including the left gastric and posterior gastric veins. Pressures were measured. The initial portal venous pressure was 32 mm of Hg and the right atrial pressure was 2 mm of Hg yielding a portosystemic gradient of 30 mmHg. The Amplatz wire was advanced into the splenic vein. The pigtail catheter was removed. The transhepatic tract was dilated to 6 mm using a 6 x 40 mm [place] balloon. The 10 French sheath was then advanced over the balloon as it was deflated. A 6 cm covered Viatorr stent graft was selected. The stent graft was advanced over the wire and into the main portal vein. The stent was unsheathed. The non covered portion was allowed to open fully and was pulled back snug against the entry point into the left portal vein. The remainder of the stent was then deployed. The stent was post dilated to 8 mm using an 8 x 80 mm Athletis balloon. The pigtail catheter was reintroduced over the wire. Follow-up portal venography demonstrates a well-positioned and widely patent right middle to left portal TIPS. No further filling of venous collaterals. Pressures were again measured. Post tips portal venous pressure was 12 mm of Hg and the right atrial pressure was 5 mm of Hg consistent with a portosystemic gradient of 7 mm Hg. The pigtail catheter was removed over a wire. The right IJ sheath was removed and hemostasis attained by manual pressure. The ice catheter was removed as well as the right common femoral sheath. Hemostasis was attained by manual compression. IMPRESSION: 1. Successful paracentesis yielding 1.5 L. 2. Successful 8 mm TIPS creation. 3. Initial portosystemic gradient was 30 mm of Hg which corrected to 7 mm of Hg following TIPS creation. Patient will be admitted to the hospitalist service for overnight observation. Outpatient  follow-up with a TIPS ultrasound in 1 month. Electronically Signed   By: Fernando Hoyer M.D.   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 She also has a history of esophageal varices status post prior banding procedures. She presents for elective TIPS creation. EXAM: 1. Ultrasound-guided paracentesis 2. TIPS creation utilizing intracardiac echocardiography (ICE) as intravascular ultrasound guidance MEDICATIONS: In patient already receiving IV Rocephin . No additional prophylaxis administered. ANESTHESIA/SEDATION: General - as administered by the Anesthesia department  CONTRAST:  100 mL Omnipaque  300 FLUOROSCOPY TIME:  Radiation exposure index: 746 mGy reference air kerma COMPLICATIONS: None immediate. PROCEDURE: Informed written consent was obtained from the patient after a thorough discussion of the procedural risks, benefits and alternatives. All questions were addressed. Maximal Sterile Barrier Technique was utilized including caps, mask, sterile gowns, sterile gloves, sterile drape, hand hygiene and skin antiseptic. A timeout was performed prior to the initiation of the procedure. The right upper quadrant was interrogated with ultrasound. There is a relatively small volume ascites in the perihepatic station. A suitable skin entry site was selected. A smaller the was made. Under real-time ultrasound guidance, a 6 French Safe-T-Centesis catheter was advanced into the perihepatic fluid collection. Paracentesis was then performed yielding approximately 1.5 L of golden the ascites. The right common femoral vein was interrogated with ultrasound and found to be widely patent. An image was obtained and stored for the medical record. Local anesthesia was attained by infiltration with 1% lidocaine . A small dermatotomy was made. Under real-time sonographic guidance,  the vessel was punctured with a 21 gauge micropuncture needle. Using standard technique, the initial micro needle was exchanged over a 0.018 micro wire for a transitional 4 [place] micro sheath. The micro sheath was then exchanged over a 0.035 wire for 8 French vascular sheath which was advanced into the inferior vena cava. The 8 French intracardiac echocardiography catheter was then advanced through the sheath and positioned in the inferior vena cava within the hepatic segment. Intravascular ultrasound was then used to identify the portal venous anatomy. The right internal jugular vein was interrogated with ultrasound and found to be widely patent. An image was obtained and stored for the medical record. Local anesthesia was attained by infiltration with 1% lidocaine . A small dermatotomy was made. Under real-time sonographic guidance, the vessel was punctured with a 21 gauge micropuncture needle. Using standard technique, the initial micro needle was exchanged over a 0.018 micro wire for a transitional 4 [place] micro sheath. The micro sheath was then exchanged over a 0.035 wire for a 10 French dilator in the percutaneous tract was dilated. A 10 French cook flexor sheath was then advanced over the wire and into the inferior vena cava. An angled MPA catheter was advanced through the sheath and used to select several hepatic veins. Hepatic venography was performed. Of the middle and right hepatic veins were catheterized and venography performed. Initially, the catheter was parked in the right hepatic vein and an Amplatz wire advanced. The catheter was exchanged in the tips sheath advanced over the wire. The scorpion set was then advanced over the wire. A single initial pass was attempted, however the angle was suboptimal in the portal vein could not be accessed. The scorpion set was removed in the catheter was used to select the middle hepatic vein. The Amplatz wire was advanced and parked. The 10 French flexor sheath  was advanced into the vein followed by the scorpion set. This time, on a single pass the scorpion device was successfully advanced into the portal vein. Contrast injection confirms puncture of the central left portal vein. The Glidewire Advantage was advanced into the splenic vein. The scorpion set was exchanged for a marker pigtail catheter. A portal venogram was performed. Confirmation of puncture of the central left portal system. Several small collaterals are evident including the left gastric and posterior gastric veins. Pressures were measured. The initial portal venous pressure was 32 mm of Hg and the right atrial pressure was 2 mm of Hg  yielding a portosystemic gradient of 30 mmHg. The Amplatz wire was advanced into the splenic vein. The pigtail catheter was removed. The transhepatic tract was dilated to 6 mm using a 6 x 40 mm [place] balloon. The 10 French sheath was then advanced over the balloon as it was deflated. A 6 cm covered Viatorr stent graft was selected. The stent graft was advanced over the wire and into the main portal vein. The stent was unsheathed. The non covered portion was allowed to open fully and was pulled back snug against the entry point into the left portal vein. The remainder of the stent was then deployed. The stent was post dilated to 8 mm using an 8 x 80 mm Athletis balloon. The pigtail catheter was reintroduced over the wire. Follow-up portal venography demonstrates a well-positioned and widely patent right middle to left portal TIPS. No further filling of venous collaterals. Pressures were again measured. Post tips portal venous pressure was 12 mm of Hg and the right atrial pressure was 5 mm of Hg consistent with a portosystemic gradient of 7 mm Hg. The pigtail catheter was removed over a wire. The right IJ sheath was removed and hemostasis attained by manual pressure. The ice catheter was removed as well as the right common femoral sheath. Hemostasis was attained by manual  compression. IMPRESSION: 1. Successful paracentesis yielding 1.5 L. 2. Successful 8 mm TIPS creation. 3. Initial portosystemic gradient was 30 mm of Hg which corrected to 7 mm of Hg following TIPS creation. Patient will be admitted to the hospitalist service for overnight observation. Outpatient follow-up with a TIPS ultrasound in 1 month. Electronically Signed   By: Fernando Hoyer M.D.   On: 06/09/2023 16:39   US  Paracentesis Result Date: 06/05/2023 INDICATION: Patient with history of NASH cirrhosis, recurrent ascites. Request to IR for paracentesis. EXAM: ULTRASOUND GUIDED THERAPEUTIC PARACENTESIS MEDICATIONS: 6 mL 1% lidocaine  COMPLICATIONS: None immediate. PROCEDURE: Informed written consent was obtained from the patient after a discussion of the risks, benefits and alternatives to treatment. A timeout was performed prior to the initiation of the procedure. Initial ultrasound scanning demonstrates a large amount of ascites within the right lower abdominal quadrant. The right lower abdomen was prepped and draped in the usual sterile fashion. 1% lidocaine  was used for local anesthesia. Following this, a 5 Fr, 7-cm, Merit One Step centesis catheter was introduced. An ultrasound image was saved for documentation purposes. The paracentesis was performed. The catheter was removed and a dressing was applied. The patient tolerated the procedure well without immediate post procedural complication. FINDINGS: A total of approximately 4.1 L of clear, amber fluid was removed. IMPRESSION: Successful ultrasound-guided paracentesis yielding 4.1 liters of peritoneal fluid. Performed by Nathan Bake, PA-C PLAN: The patient has required >/=2 paracenteses in a 30 day period and a formal evaluation by the [facility] Interventional Radiology Portal Hypertension Clinic has been arranged. Electronically Signed   By: Elene Griffes M.D.   On: 06/05/2023 15:41   US  Paracentesis Result Date: 06/01/2023 INDICATION: Recurrent  ascites.  History of NASH cirrhosis. EXAM: ULTRASOUND GUIDED THERAPEUTIC PARACENTESIS MEDICATIONS: None. COMPLICATIONS: None immediate. PROCEDURE: Informed written consent was obtained from the patient after a discussion of the risks, benefits and alternatives to treatment. A timeout was performed prior to the initiation of the procedure. Initial ultrasound scanning demonstrates a large amount of ascites within the right lower abdominal quadrant. The right lower abdomen was prepped and draped in the usual sterile fashion. 1% lidocaine  was used for local anesthesia. Following  this, a 6 Fr Safe-T-Centesis catheter was introduced. An ultrasound image was saved for documentation purposes. The paracentesis was performed. The catheter was removed and a dressing was applied. The patient tolerated the procedure well without immediate post procedural complication. Patient received post-procedure intravenous albumin ; see nursing notes for details. FINDINGS: A total of approximately 4200 mL of straw-colored ascitic fluid was removed. IMPRESSION: Successful ultrasound-guided paracentesis yielding 4.2 liters of peritoneal fluid. PLAN: If the patient eventually requires >/=2 paracenteses in a 30 day period, candidacy for formal evaluation by the [facility] Interventional Radiology Portal Hypertension Clinic will be assessed. Electronically Signed   By: Fernando Hoyer M.D.   On: 06/01/2023 11:56   DG Chest 2 View Result Date: 05/30/2023 CLINICAL DATA:  sob EXAM: CHEST - 2 VIEW COMPARISON:  04/01/2023. FINDINGS: Cardiac silhouette enlarged. No evidence of pneumothorax or pleural effusion. No evidence of pulmonary edema. Calcified aorta. Thoracic degenerative changes. Right-sided pacer. IMPRESSION: Enlarged cardiac silhouette. No acute cardiopulmonary process. Electronically Signed   By: Sydell Eva M.D.   On: 05/30/2023 20:27   US  Paracentesis Result Date: 05/22/2023 INDICATION: 83 year old female with nonalcoholic  liver cirrhosis with new ascites. First encounter with IR for paracentesis today. EXAM: ULTRASOUND GUIDED THERAPEUTIC AND DIAGNOSTIC PARACENTESIS MEDICATIONS: 9 mL 1% lidocaine  COMPLICATIONS: None immediate. PROCEDURE: Informed written consent was obtained from the patient after a discussion of the risks, benefits and alternatives to treatment. A timeout was performed prior to the initiation of the procedure. Initial ultrasound scanning demonstrates a large amount of ascites within the right abdomen. The right lower abdomen was prepped and draped in the usual sterile fashion. 1% lidocaine  was used for local anesthesia. Following this, a 19 gauge, 7-cm, Yueh catheter was introduced. An ultrasound image was saved for documentation purposes. The paracentesis was performed. The catheter was removed and a dressing was applied. The patient tolerated the procedure well without immediate post procedural complication. Patient received post-procedure intravenous albumin ; see nursing notes for details. FINDINGS: A total of approximately4.8 liters of hazy amber fluid was removed. Samples were sent to the laboratory as requested by the clinical team. IMPRESSION: Successful ultrasound-guided paracentesis yielding 4.8 liters of peritoneal fluid. PLAN: If the patient eventually requires >/=2 paracenteses in a 30 day period, candidacy for formal evaluation by the [facility] Interventional Radiology Portal Hypertension Clinic will be assessed. Performed by Terressa Fess, NP Electronically Signed   By: Myrlene Asper D.O.   On: 05/22/2023 14:16   CT ABDOMEN PELVIS W CONTRAST Result Date: 05/21/2023 CLINICAL DATA:  Abdominal pain.  Ascites.  Decreased hemoglobin. EXAM: CT ABDOMEN AND PELVIS WITH CONTRAST TECHNIQUE: Multidetector CT imaging of the abdomen and pelvis was performed using the standard protocol following bolus administration of intravenous contrast. RADIATION DOSE REDUCTION: This exam was performed according to the  departmental dose-optimization program which includes automated exposure control, adjustment of the mA and/or kV according to patient size and/or use of iterative reconstruction technique. CONTRAST:  OMNIPAQUE  IOHEXOL  300 MG/ML  SOLN COMPARISON:  CT dated 09/11/2022. FINDINGS: Lower chest: Bibasilar atelectasis/scarring. Cardiac pacemaker noted. No intra-abdominal free air.  Moderate ascites. Hepatobiliary: Cirrhosis.  No biliary dilatation.  Cholecystectomy. Pancreas: Unremarkable. No pancreatic ductal dilatation or surrounding inflammatory changes. Spleen: Mild splenomegaly measuring 16 cm in length. Adrenals/Urinary Tract: The adrenal glands are unremarkable. There is no hydronephrosis on either side. There is symmetric enhancement and excretion of contrast by both kidneys. The urinary bladder is grossly unremarkable. Stomach/Bowel: Mild sigmoid diverticulosis. Diffuse thickened appearance of the colon likely  related to ascites and hepatic colopathy. Colitis is less likely. Clinical correlation is recommended. There is a small hiatal hernia. There is no bowel obstruction. Appendectomy. Vascular/Lymphatic: Moderate aortoiliac atherosclerotic disease. The IVC is unremarkable. No portal venous gas. [place] off a geode varices. No adenopathy. Reproductive: Hysterectomy. Other: Mild subcutaneous edema. Musculoskeletal: Osteopenia with degenerative changes. No acute osseous pathology. IMPRESSION: 1. Cirrhosis with moderate ascites and mild splenomegaly. 2. Mild sigmoid diverticulosis. No bowel obstruction. Electronically Signed   By: Angus Bark M.D.   On: 05/21/2023 18:42    Microbiology: Results for orders placed or performed during the hospital encounter of 05/30/23  Peritoneal fluid culture w Gram Stain     Status: None   Collection Time: 05/30/23  6:09 PM   Specimen: Peritoneal Washings; Peritoneal Fluid  Result Value Ref Range Status   Specimen Description   Final    PERITONEAL Performed at  [facility], [address], [place], [place] [address]    Special Requests   Final    NONE Performed at [facility], [address] [place], [place] [address]    Gram Stain NO WBC SEEN NO ORGANISMS SEEN   Final   Culture   Final    NO GROWTH 3 DAYS Performed at [facility], [address], [place], [place] [address]    Report Status 06/03/2023 FINAL  Final  Body fluid culture w Gram Stain     Status: None   Collection Time: 05/31/23  4:53 PM   Specimen: PATH Cytology Peritoneal fluid  Result Value Ref Range Status   Specimen Description   Final    PERITONEAL Performed at [facility], [address], [place], [place] [address]    Special Requests   Final    NONE Performed at [facility], [address] [place], [place] [address]    Gram Stain NO WBC SEEN NO ORGANISMS SEEN   Final   Culture   Final    NO GROWTH 3 DAYS Performed at [facility], [address], [place], [place] [address]    Report Status 06/04/2023 FINAL  Final    Labs: CBC: Recent Labs  Lab 06/12/23 0505 06/14/23 0446 06/16/23 0418  WBC 5.4 4.9 6.0  HGB 10.0* 10.7* 11.1*  HCT 29.1* 31.1* 32.1*  MCV 93.3 93.1 91.5  PLT 81* 105* 114*   Basic Metabolic Panel: Recent Labs  Lab 06/13/23 0459 06/14/23 0446 06/15/23 0508 06/16/23 0418 06/17/23 0527 06/18/23 0519  NA 135 135 136 136 134* 133*  K 3.5 3.9 3.4* 4.1 3.5 3.3*  CL 101 98 96* 96* 98 99  CO2 26 28 28 28 27 27   GLUCOSE 101* 152* 139* 127* 127* 121*  BUN 11 11 12 12 13 15   CREATININE 0.61 0.70 0.67 0.86 0.73 0.69  CALCIUM 8.3* 8.5* 8.7* 9.0 8.6* 8.7*  MG 2.0  --   --  1.9  --   --    Liver Function Tests: Recent Labs  Lab 06/15/23 0508 06/16/23 0418 06/17/23 0527 06/18/23 0519  AST 83* 67* 55* 45*  ALT 75* 62* 47* 39  ALKPHOS 73 77 73 73  BILITOT 2.4* 2.2* 1.6* 1.5*  PROT 6.2* 6.5 6.2* 6.3*  ALBUMIN  2.9* 3.2* 3.1* 3.0*   CBG: Recent Labs  Lab 06/17/23 0829  06/17/23 1126 06/17/23 1614 06/17/23 2104 06/18/23 0831  GLUCAP 118* 179* 140* 155* 166*    Discharge time spent: greater than 30 minutes.  Signed: Rich Foster  Clelia Current, MD [facility] 06/18/2023

## 2023-06-18 NOTE — Discharge Instructions (Signed)
 You can take the lactulose  up to three times a day if needed for constipation.  At least take once per day.

## 2023-06-18 NOTE — Anesthesia Postprocedure Evaluation (Signed)
 Anesthesia Post Note  Patient: Annette Foster  Procedure(s) Performed: IR TIPS  Patient location during evaluation: PACU Anesthesia Type: General Level of consciousness: awake and alert Pain management: pain level controlled Vital Signs Assessment: post-procedure vital signs reviewed and stable Respiratory status: spontaneous breathing, nonlabored ventilation, respiratory function stable and patient connected to nasal cannula oxygen Cardiovascular status: blood pressure returned to baseline and stable Postop Assessment: no apparent nausea or vomiting Anesthetic complications: no   There were no known notable events for this encounter.   Last Vitals:  Vitals:   06/18/23 0507 06/18/23 0831  BP: (!) 112/44 (!) 116/50  Pulse: 76 76  Resp: 16 17  Temp: 36.6 C 36.7 C  SpO2: 97% 100%    Last Pain:  Vitals:   06/18/23 0337  TempSrc:   PainSc: 0-No pain                 Zula Hitch

## 2023-06-19 ENCOUNTER — Other Ambulatory Visit: Payer: Self-pay | Admitting: Interventional Radiology

## 2023-06-19 DIAGNOSIS — Z95828 Presence of other vascular implants and grafts: Secondary | ICD-10-CM

## 2023-06-19 DIAGNOSIS — K7581 Nonalcoholic steatohepatitis (NASH): Secondary | ICD-10-CM

## 2023-06-20 ENCOUNTER — Ambulatory Visit (INDEPENDENT_AMBULATORY_CARE_PROVIDER_SITE_OTHER): Payer: Medicare PPO

## 2023-06-20 DIAGNOSIS — I495 Sick sinus syndrome: Secondary | ICD-10-CM | POA: Diagnosis not present

## 2023-06-20 LAB — CUP PACEART REMOTE DEVICE CHECK
Date Time Interrogation Session: 20250509072017
Implantable Lead Connection Status: 753985
Implantable Lead Connection Status: 753985
Implantable Lead Implant Date: 20230210
Implantable Lead Implant Date: 20230210
Implantable Lead Location: 753859
Implantable Lead Location: 753860
Implantable Lead Model: 377169
Implantable Lead Model: 377171
Implantable Lead Serial Number: 7000400966
Implantable Lead Serial Number: 7000402075
Implantable Pulse Generator Implant Date: 20230210
Pulse Gen Model: 407145
Pulse Gen Serial Number: 70331369

## 2023-06-22 ENCOUNTER — Encounter: Payer: Self-pay | Admitting: Cardiology

## 2023-06-23 ENCOUNTER — Telehealth: Payer: Self-pay

## 2023-06-23 NOTE — Telephone Encounter (Signed)
 Patient reports that she fell outside on the gravel at the time of this event.  Daughter was with her. Overall she is very weak due to  being 2 weeks post-op from abdominal surgery. Otherwise, she is doing okay.  No new symptoms.    No missed doses of medications. She is staying with her daughter for the time being.  Has appts with PCP on Wednesay and Gen Cards on Thursday.   Will continue to monitor.  No changes at this time.

## 2023-06-23 NOTE — Telephone Encounter (Signed)
 Biotronik alert: tachycardia Event:  30 minutes of Aflutter with mean ventricular rates of 118bpm.  Overall heart rates controlled.  Recent hospitalization. Has follow up with gen cards on 06/26/23.   LM to assess patient's symptoms.

## 2023-06-26 ENCOUNTER — Encounter: Payer: Self-pay | Admitting: Internal Medicine

## 2023-06-26 ENCOUNTER — Ambulatory Visit: Payer: Medicare PPO | Attending: Internal Medicine | Admitting: Internal Medicine

## 2023-06-26 VITALS — BP 122/62 | HR 66 | Ht 63.0 in | Wt 142.4 lb

## 2023-06-26 DIAGNOSIS — I480 Paroxysmal atrial fibrillation: Secondary | ICD-10-CM | POA: Diagnosis not present

## 2023-06-26 DIAGNOSIS — I5032 Chronic diastolic (congestive) heart failure: Secondary | ICD-10-CM | POA: Diagnosis not present

## 2023-06-26 DIAGNOSIS — Z950 Presence of cardiac pacemaker: Secondary | ICD-10-CM | POA: Diagnosis not present

## 2023-06-26 DIAGNOSIS — K7581 Nonalcoholic steatohepatitis (NASH): Secondary | ICD-10-CM | POA: Diagnosis not present

## 2023-06-26 DIAGNOSIS — Z79899 Other long term (current) drug therapy: Secondary | ICD-10-CM

## 2023-06-26 DIAGNOSIS — I4891 Unspecified atrial fibrillation: Secondary | ICD-10-CM

## 2023-06-26 DIAGNOSIS — K7460 Unspecified cirrhosis of liver: Secondary | ICD-10-CM

## 2023-06-26 MED ORDER — TORSEMIDE 20 MG PO TABS: 60.0000 mg | ORAL_TABLET | Freq: Every day | ORAL | 3 refills | Status: DC

## 2023-06-26 NOTE — Patient Instructions (Signed)
 Medication Instructions:  Your physician recommends the following medication changes.  INCREASE: Torsemide  to 60 mg by mouth daily   *If you need a refill on your cardiac medications before your next appointment, please call your pharmacy*  Lab Work: Your provider would like for you to have following labs drawn today (BMP).    Testing/Procedures: No test ordered today   Follow-Up: At [facility], you and your health needs are our priority.  As part of our continuing mission to provide you with exceptional heart care, our providers are all part of one team.  This team includes your primary Cardiologist (physician) and Advanced Practice Providers or APPs (Physician Assistants and Nurse Practitioners) who all work together to provide you with the care you need, when you need it.  Your next appointment:   3 month(s)  Provider:   You may see Sammy Crisp, MD or one of the following Advanced Practice Providers on your designated Care Team:   Laneta Pintos, NP Gildardo Labrador, PA-C Varney Gentleman, PA-C Cadence [place], PA-C Ronald Cockayne, NP Morey Ar, NP    We recommend signing up for the patient portal called "MyChart".  Sign up information is provided on this After Visit Summary.  MyChart is used to connect with patients for Virtual Visits (Telemedicine).  Patients are able to view lab/test results, encounter notes, upcoming appointments, etc.  Non-urgent messages can be sent to your provider as well.   To learn more about what you can do with MyChart, go to [email or web address].

## 2023-06-26 NOTE — Progress Notes (Signed)
 Cardiology Office Note:  .   Date:  06/26/2023  ID:  Annette Foster, DOB 1940/04/18, MRN [id number] PCP: Westley Hammers, MD  [facility] Providers Cardiologist:  Sammy Crisp, MD Electrophysiologist:  Boyce Byes, MD     History of Present Illness: .   Annette Foster is a 83 y.o. 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  She was last seen in our office in January by Foster, Annette, due to increased lower extremity edema.  She was advised to take additional torsemide  and noticed some improvement in her leg edema.  She saw Dr. Marven Slimmer for EP follow-up in March, at which time she was doing well from a heart standpoint.  Our office was recently alerted about an episode of atrial flutter with rapid ventricular rates noted on device interrogation.  Of note, she had fallen outside around the time of this event.  She had been hospitalized 3 times last month due to worsening ascites, having been discharged most recently on 06/18/2023.  She required multiple paracenteses and underwent TIPS procedure on 4/28.  She noted persistent abdominal pain through much of the hospitalization though CT was unrevealing other than constipation.  Today, [name] is concerned about leg swelling that began again 4 to 5 days ago.  She feels like her abdominal distention has not worsened, though she has put on 9 pounds since leaving the hospital.  She denies chest pain and shortness of breath.  She is a little lightheaded at times.  She saw Dr. Arabella Knife yesterday, who advised her to take an extra dose of  torsemide  every other day.  She is scheduled for follow-up with Dr. Emerick Hanlon (GI) in 2 weeks.  [name] notes that she lost her balance and fell this past weekend.  This led to abrasions of the left leg and right arm.  ROS: See HPI  Studies Reviewed: Aaron Aas   EKG Interpretation Date/Time:  Thursday Jun 26 2023 09:55:43 EDT Ventricular Rate:  66 PR Interval:  200 QRS Duration:  158 QT Interval:  482 QTC Calculation: 505 R Axis:   51  Text Interpretation: AV dual-paced rhythm Abnormal ECG When compared with ECG of 04-Jun-2023 17:27, Premature ventricular complexes are no longer Present Vent. rate has decreased BY   8 BPM Confirmed by Cherylynn Liszewski [id number]) on 06/26/2023 9:59:34 AM    Risk Assessment/Calculations:    CHA2DS2-VASc Score = 6   This indicates a 9.7% annual risk of stroke. The patient's score is based upon: CHF History: 1 HTN History: 1 Diabetes History: 1 Stroke History: 0 Vascular Disease History: 0 Age Score: 2 Gender Score: 1            Physical Exam:   VS:  BP 122/62   Pulse 66   Ht 5\' 3"  (1.6 m)   Wt 142 lb 6.4 oz (64.6 kg)   LMP  (LMP Unknown)   SpO2 98%   BMI 25.23 kg/m    Wt Readings from Last 3 Encounters:  06/26/23 142 lb 6.4 oz (64.6 kg)  06/04/23 151 lb (68.5 kg)  06/04/23 151 lb (68.5 kg)    General:  NAD. Neck: No JVD or HJR. Lungs:  Clear to auscultation bilaterally without wheezes or crackles. Heart: Regular rate and rhythm with 2/6 systolic murmur.  No rubs or gallops. Abdomen: Soft, nontender, nondistended. Extremities: 1+ pretibial edema bilaterally.  ASSESSMENT AND PLAN: .    Paroxysmal atrial fibrillation status post AV node ablation and permanent pacemaker placement: Ms. Sawdey' EKG demonstrates AV paced rhythm today; she has not had any palpitations.  She is not on anticoagulation due to history of GI bleeding in the setting of her chronic liver disease as well as recurrent falls.  Continue follow-up with EP regarding management  of PAF and PPM.  HFpEF: [name] has a history of significant LVH by echo and cardiac MRI; it remains unclear if this is due to hypertensive heart disease or hypertrophic cardiomyopathy.  [name] has put on 9 pounds since her last hospital discharge and complains of worsening leg edema.  I have asked her to increase torsemide  to 60 mg daily until she is seen for follow-up by Dr. Emerick Hanlon.  I will check a basic metabolic panel today to assess her kidney function and electrolytes; she may need escalation of spironolactone  and/or addition of a potassium supplement given that she has chronic borderline hypokalemia.  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 issues.  She is status post recent TIPS due to recurrent ascites requiring multiple paracenteses.  Some of her lower extremity edema may also be related to her portal hypertension and liver disease, leading to third spacing in the setting of hypoalbuminemia.  As above, will increase torsemide  to 60 mg daily and check serum chemistries to see if spironolactone  can be increased as well.  [name] is scheduled for follow-up with Dr. Emerick Hanlon in 2 weeks, though I have asked her to reach out to his office to see if this can be moved up.    Dispo: Return to clinic in 3 months.  Signed, Sammy Crisp, MD

## 2023-06-27 ENCOUNTER — Ambulatory Visit: Payer: Self-pay | Admitting: Internal Medicine

## 2023-06-27 ENCOUNTER — Telehealth: Payer: Self-pay

## 2023-06-27 ENCOUNTER — Telehealth: Payer: Self-pay | Admitting: Internal Medicine

## 2023-06-27 DIAGNOSIS — Z79899 Other long term (current) drug therapy: Secondary | ICD-10-CM

## 2023-06-27 LAB — BASIC METABOLIC PANEL WITH GFR
BUN/Creatinine Ratio: 17 (ref 12–28)
BUN: 13 mg/dL (ref 8–27)
CO2: 29 mmol/L (ref 20–29)
Calcium: 9.2 mg/dL (ref 8.7–10.3)
Chloride: 99 mmol/L (ref 96–106)
Creatinine, Ser: 0.75 mg/dL (ref 0.57–1.00)
Glucose: 151 mg/dL — ABNORMAL HIGH (ref 70–99)
Potassium: 3.2 mmol/L — ABNORMAL LOW (ref 3.5–5.2)
Sodium: 142 mmol/L (ref 134–144)
eGFR: 79 mL/min/{1.73_m2} (ref >59)

## 2023-06-27 MED ORDER — POTASSIUM CHLORIDE CRYS ER 20 MEQ PO TBCR
20.0000 meq | EXTENDED_RELEASE_TABLET | Freq: Every day | ORAL | 3 refills | Status: DC
Start: 2023-06-27 — End: 2023-07-01

## 2023-06-27 MED ORDER — SPIRONOLACTONE 50 MG PO TABS: 75.0000 mg | ORAL_TABLET | Freq: Every day | ORAL | 3 refills | Status: DC

## 2023-06-27 NOTE — Telephone Encounter (Signed)
 The patient's daughte has been notified of the result along with recommendations. Pt's daughter verbalized understanding. All questions (if any) were answered     Annette Crisp, MD 06/27/2023  6:51 AM EDT     Please let [name] know that her potassium is a little low.  Kidney function is stable.  Since we increased her torsemide  yesterday, I recommend increasing spironolactone  to 75 mg daily and also adding potassium chloride  20 mEq daily.  She should have a repeat BMP done in ~1-2 weeks (this can be performed though our office or when she sees Dr. Emerick Hanlon).

## 2023-06-27 NOTE — Telephone Encounter (Signed)
 Daughter Christiane Cowing) returned RN's call regarding results.

## 2023-06-27 NOTE — Telephone Encounter (Signed)
 Call from [facility] (Referral Follow-up): Danni called from [facility] to check on the status of a patient referral she sent over. I informed her that the patient has an appointment scheduled with [facility] on 07/10/2023. She stated that she would go ahead and notate that information in their system.

## 2023-06-28 ENCOUNTER — Inpatient Hospital Stay
Admission: EM | Admit: 2023-06-28 | Discharge: 2023-07-01 | DRG: 178 | Disposition: A | Discharge status: Home Health | Attending: Student | Admitting: Student

## 2023-06-28 ENCOUNTER — Emergency Department

## 2023-06-28 ENCOUNTER — Encounter: Payer: Self-pay | Admitting: Internal Medicine

## 2023-06-28 ENCOUNTER — Other Ambulatory Visit: Payer: Self-pay

## 2023-06-28 DIAGNOSIS — K7460 Unspecified cirrhosis of liver: Secondary | ICD-10-CM | POA: Diagnosis present

## 2023-06-28 DIAGNOSIS — Z515 Encounter for palliative care: Secondary | ICD-10-CM

## 2023-06-28 DIAGNOSIS — G9340 Encephalopathy, unspecified: Secondary | ICD-10-CM | POA: Diagnosis not present

## 2023-06-28 DIAGNOSIS — E785 Hyperlipidemia, unspecified: Secondary | ICD-10-CM | POA: Diagnosis present

## 2023-06-28 DIAGNOSIS — Z961 Presence of intraocular lens: Secondary | ICD-10-CM | POA: Diagnosis present

## 2023-06-28 DIAGNOSIS — G4733 Obstructive sleep apnea (adult) (pediatric): Secondary | ICD-10-CM | POA: Diagnosis present

## 2023-06-28 DIAGNOSIS — Z794 Long term (current) use of insulin: Secondary | ICD-10-CM | POA: Diagnosis not present

## 2023-06-28 DIAGNOSIS — T502X5A Adverse effect of carbonic-anhydrase inhibitors, benzothiadiazides and other diuretics, initial encounter: Secondary | ICD-10-CM | POA: Diagnosis present

## 2023-06-28 DIAGNOSIS — Z9012 Acquired absence of left breast and nipple: Secondary | ICD-10-CM

## 2023-06-28 DIAGNOSIS — E039 Hypothyroidism, unspecified: Secondary | ICD-10-CM | POA: Diagnosis present

## 2023-06-28 DIAGNOSIS — Z79890 Hormone replacement therapy (postmenopausal): Secondary | ICD-10-CM

## 2023-06-28 DIAGNOSIS — I10 Essential (primary) hypertension: Secondary | ICD-10-CM | POA: Diagnosis present

## 2023-06-28 DIAGNOSIS — E119 Type 2 diabetes mellitus without complications: Secondary | ICD-10-CM

## 2023-06-28 DIAGNOSIS — E663 Overweight: Secondary | ICD-10-CM | POA: Diagnosis not present

## 2023-06-28 DIAGNOSIS — Z803 Family history of malignant neoplasm of breast: Secondary | ICD-10-CM

## 2023-06-28 DIAGNOSIS — I5032 Chronic diastolic (congestive) heart failure: Secondary | ICD-10-CM | POA: Diagnosis present

## 2023-06-28 DIAGNOSIS — Z7189 Other specified counseling: Secondary | ICD-10-CM | POA: Diagnosis not present

## 2023-06-28 DIAGNOSIS — K769 Liver disease, unspecified: Secondary | ICD-10-CM | POA: Diagnosis not present

## 2023-06-28 DIAGNOSIS — Z79899 Other long term (current) drug therapy: Secondary | ICD-10-CM

## 2023-06-28 DIAGNOSIS — J690 Pneumonitis due to inhalation of food and vomit: Principal | ICD-10-CM | POA: Diagnosis present

## 2023-06-28 DIAGNOSIS — Z8052 Family history of malignant neoplasm of bladder: Secondary | ICD-10-CM

## 2023-06-28 DIAGNOSIS — K7581 Nonalcoholic steatohepatitis (NASH): Secondary | ICD-10-CM | POA: Diagnosis present

## 2023-06-28 DIAGNOSIS — Z923 Personal history of irradiation: Secondary | ICD-10-CM

## 2023-06-28 DIAGNOSIS — D61818 Other pancytopenia: Secondary | ICD-10-CM | POA: Diagnosis present

## 2023-06-28 DIAGNOSIS — I2510 Atherosclerotic heart disease of native coronary artery without angina pectoris: Secondary | ICD-10-CM | POA: Diagnosis present

## 2023-06-28 DIAGNOSIS — Z8249 Family history of ischemic heart disease and other diseases of the circulatory system: Secondary | ICD-10-CM | POA: Diagnosis not present

## 2023-06-28 DIAGNOSIS — Z853 Personal history of malignant neoplasm of breast: Secondary | ICD-10-CM

## 2023-06-28 DIAGNOSIS — Z888 Allergy status to other drugs, medicaments and biological substances status: Secondary | ICD-10-CM

## 2023-06-28 DIAGNOSIS — K7682 Hepatic encephalopathy: Secondary | ICD-10-CM | POA: Diagnosis present

## 2023-06-28 DIAGNOSIS — I110 Hypertensive heart disease with heart failure: Secondary | ICD-10-CM | POA: Diagnosis present

## 2023-06-28 DIAGNOSIS — K766 Portal hypertension: Secondary | ICD-10-CM | POA: Diagnosis present

## 2023-06-28 DIAGNOSIS — D696 Thrombocytopenia, unspecified: Secondary | ICD-10-CM | POA: Diagnosis present

## 2023-06-28 DIAGNOSIS — Z800 Family history of malignant neoplasm of digestive organs: Secondary | ICD-10-CM

## 2023-06-28 DIAGNOSIS — K5900 Constipation, unspecified: Secondary | ICD-10-CM | POA: Diagnosis present

## 2023-06-28 DIAGNOSIS — J189 Pneumonia, unspecified organism: Secondary | ICD-10-CM | POA: Diagnosis not present

## 2023-06-28 DIAGNOSIS — Z8042 Family history of malignant neoplasm of prostate: Secondary | ICD-10-CM

## 2023-06-28 DIAGNOSIS — Z950 Presence of cardiac pacemaker: Secondary | ICD-10-CM | POA: Diagnosis not present

## 2023-06-28 DIAGNOSIS — Z885 Allergy status to narcotic agent status: Secondary | ICD-10-CM

## 2023-06-28 DIAGNOSIS — Z86008 Personal history of in-situ neoplasm of other site: Secondary | ICD-10-CM

## 2023-06-28 DIAGNOSIS — E876 Hypokalemia: Secondary | ICD-10-CM | POA: Diagnosis present

## 2023-06-28 DIAGNOSIS — Z833 Family history of diabetes mellitus: Secondary | ICD-10-CM

## 2023-06-28 DIAGNOSIS — Z96652 Presence of left artificial knee joint: Secondary | ICD-10-CM | POA: Diagnosis present

## 2023-06-28 DIAGNOSIS — Z9841 Cataract extraction status, right eye: Secondary | ICD-10-CM

## 2023-06-28 DIAGNOSIS — K7290 Hepatic failure, unspecified without coma: Secondary | ICD-10-CM | POA: Diagnosis not present

## 2023-06-28 DIAGNOSIS — K589 Irritable bowel syndrome without diarrhea: Secondary | ICD-10-CM | POA: Diagnosis present

## 2023-06-28 DIAGNOSIS — K219 Gastro-esophageal reflux disease without esophagitis: Secondary | ICD-10-CM | POA: Diagnosis present

## 2023-06-28 DIAGNOSIS — Z806 Family history of leukemia: Secondary | ICD-10-CM

## 2023-06-28 DIAGNOSIS — Z9842 Cataract extraction status, left eye: Secondary | ICD-10-CM

## 2023-06-28 DIAGNOSIS — Z90710 Acquired absence of both cervix and uterus: Secondary | ICD-10-CM

## 2023-06-28 LAB — CBC
HCT: 32.4 % — ABNORMAL LOW (ref 36.0–46.0)
Hemoglobin: 11.1 g/dL — ABNORMAL LOW (ref 12.0–15.0)
MCH: 32.5 pg (ref 26.0–34.0)
MCHC: 34.3 g/dL (ref 30.0–36.0)
MCV: 94.7 fL (ref 80.0–100.0)
Platelets: 91 10*3/uL — ABNORMAL LOW (ref 150–400)
RBC: 3.42 MIL/uL — ABNORMAL LOW (ref 3.87–5.11)
RDW: 16.5 % — ABNORMAL HIGH (ref 11.5–15.5)
WBC: 2.8 10*3/uL — ABNORMAL LOW (ref 4.0–10.5)
nRBC: 0 % (ref 0.0–0.2)

## 2023-06-28 LAB — URINALYSIS, ROUTINE W REFLEX MICROSCOPIC
Bilirubin Urine: NEGATIVE
Glucose, UA: NEGATIVE mg/dL
Hgb urine dipstick: NEGATIVE
Ketones, ur: NEGATIVE mg/dL
Leukocytes,Ua: NEGATIVE
Nitrite: NEGATIVE
Protein, ur: NEGATIVE mg/dL
Specific Gravity, Urine: 1.011 (ref 1.005–1.030)
pH: 8 (ref 5.0–8.0)

## 2023-06-28 LAB — CBG MONITORING, ED
Glucose-Capillary: 106 mg/dL — ABNORMAL HIGH (ref 70–99)
Glucose-Capillary: 95 mg/dL (ref 70–99)

## 2023-06-28 LAB — COMPREHENSIVE METABOLIC PANEL WITH GFR
ALT: 21 U/L (ref 0–44)
AST: 46 U/L — ABNORMAL HIGH (ref 15–41)
Albumin: 3.3 g/dL — ABNORMAL LOW (ref 3.5–5.0)
Alkaline Phosphatase: 56 U/L (ref 38–126)
Anion gap: 10 (ref 5–15)
BUN: 16 mg/dL (ref 8–23)
CO2: 28 mmol/L (ref 22–32)
Calcium: 8.8 mg/dL — ABNORMAL LOW (ref 8.9–10.3)
Chloride: 104 mmol/L (ref 98–111)
Creatinine, Ser: 0.8 mg/dL (ref 0.44–1.00)
GFR, Estimated: >60 mL/min (ref >60)
Glucose, Bld: 125 mg/dL — ABNORMAL HIGH (ref 70–99)
Potassium: 2.9 mmol/L — ABNORMAL LOW (ref 3.5–5.1)
Sodium: 142 mmol/L (ref 135–145)
Total Bilirubin: 3 mg/dL — ABNORMAL HIGH (ref 0.0–1.2)
Total Protein: 6.7 g/dL (ref 6.5–8.1)

## 2023-06-28 LAB — AMMONIA: Ammonia: 46 umol/L — ABNORMAL HIGH (ref 9–35)

## 2023-06-28 LAB — PHOSPHORUS: Phosphorus: 3.3 mg/dL (ref 2.5–4.6)

## 2023-06-28 LAB — LACTIC ACID, PLASMA: Lactic Acid, Venous: 2.1 mmol/L (ref 0.5–1.9)

## 2023-06-28 LAB — BRAIN NATRIURETIC PEPTIDE: B Natriuretic Peptide: 222.6 pg/mL — ABNORMAL HIGH (ref 0.0–100.0)

## 2023-06-28 LAB — MAGNESIUM: Magnesium: 2.4 mg/dL (ref 1.7–2.4)

## 2023-06-28 MED ORDER — POTASSIUM CHLORIDE 10 MEQ/100ML IV SOLN
10.0000 meq | INTRAVENOUS | Status: AC
  Administered 2023-06-28 – 2023-06-29 (×4): 10 meq via INTRAVENOUS
  Filled 2023-06-28 (×4): qty 100

## 2023-06-28 MED ORDER — VANCOMYCIN HCL IN DEXTROSE 1-5 GM/200ML-% IV SOLN: 1000.0000 mg | INTRAVENOUS | Status: DC

## 2023-06-28 MED ORDER — INSULIN ASPART 100 UNIT/ML IJ SOLN
0.0000 [IU] | Freq: Three times a day (TID) | INTRAMUSCULAR | Status: DC
  Administered 2023-06-29: 2 [IU] via SUBCUTANEOUS
  Administered 2023-06-30 (×2): 1 [IU] via SUBCUTANEOUS
  Administered 2023-07-01: 2 [IU] via SUBCUTANEOUS
  Filled 2023-06-28 (×4): qty 1

## 2023-06-28 MED ORDER — INSULIN ASPART 100 UNIT/ML IJ SOLN: 0.0000 [IU] | Freq: Every day | INTRAMUSCULAR | Status: DC

## 2023-06-28 MED ORDER — LACTULOSE ENEMA
300.0000 mL | Freq: Two times a day (BID) | ORAL | Status: DC
  Administered 2023-06-29 (×2): 300 mL via RECTAL
  Filled 2023-06-28 (×4): qty 300

## 2023-06-28 MED ORDER — INSULIN GLARGINE-YFGN 100 UNIT/ML ~~LOC~~ SOLN
4.0000 [IU] | Freq: Every day | SUBCUTANEOUS | Status: DC
  Administered 2023-06-29 – 2023-06-30 (×2): 4 [IU] via SUBCUTANEOUS
  Filled 2023-06-28 (×4): qty 0.04

## 2023-06-28 MED ORDER — POTASSIUM CHLORIDE CRYS ER 20 MEQ PO TBCR
40.0000 meq | EXTENDED_RELEASE_TABLET | ORAL | Status: DC
Start: 2023-06-28 — End: 2023-06-28

## 2023-06-28 MED ORDER — LACTULOSE 10 GM/15ML PO SOLN: 30.0000 g | Freq: Two times a day (BID) | ORAL | Status: DC

## 2023-06-28 MED ORDER — DIPHENHYDRAMINE HCL 50 MG/ML IJ SOLN
12.5000 mg | Freq: Three times a day (TID) | INTRAMUSCULAR | Status: DC | PRN
  Administered 2023-06-30: 12.5 mg via INTRAVENOUS
  Filled 2023-06-28: qty 1

## 2023-06-28 MED ORDER — PIPERACILLIN-TAZOBACTAM 3.375 G IVPB: 3.3750 g | Freq: Three times a day (TID) | INTRAVENOUS | Status: DC

## 2023-06-28 MED ORDER — ACETAMINOPHEN 325 MG PO TABS
650.0000 mg | ORAL_TABLET | Freq: Four times a day (QID) | ORAL | Status: DC | PRN
Start: 2023-06-28 — End: 2023-07-01

## 2023-06-28 MED ORDER — POTASSIUM CHLORIDE 10 MEQ/100ML IV SOLN: 10.0000 meq | INTRAVENOUS | Status: DC

## 2023-06-28 MED ORDER — LEVOFLOXACIN IN D5W 750 MG/150ML IV SOLN
750.0000 mg | Freq: Once | INTRAVENOUS | Status: DC
  Filled 2023-06-28: qty 150

## 2023-06-28 MED ORDER — SODIUM CHLORIDE 0.9 % IV SOLN
2.0000 g | Freq: Once | INTRAVENOUS | Status: AC
  Administered 2023-06-28: 2 g via INTRAVENOUS
  Filled 2023-06-28: qty 12.5

## 2023-06-28 MED ORDER — ALBUTEROL SULFATE (2.5 MG/3ML) 0.083% IN NEBU: 2.5000 mg | INHALATION_SOLUTION | RESPIRATORY_TRACT | Status: DC | PRN

## 2023-06-28 MED ORDER — SODIUM CHLORIDE 0.9 % IV SOLN
3.0000 g | Freq: Four times a day (QID) | INTRAVENOUS | Status: DC
  Administered 2023-06-29 – 2023-07-01 (×11): 3 g via INTRAVENOUS
  Filled 2023-06-28 (×12): qty 8

## 2023-06-28 MED ORDER — VANCOMYCIN HCL IN DEXTROSE 1-5 GM/200ML-% IV SOLN: 1000.0000 mg | Freq: Once | INTRAVENOUS | Status: DC

## 2023-06-28 MED ORDER — VANCOMYCIN HCL 1500 MG/300ML IV SOLN
1500.0000 mg | Freq: Once | INTRAVENOUS | Status: DC
Start: 2023-06-28 — End: 2023-06-28
  Filled 2023-06-28: qty 300

## 2023-06-28 MED ORDER — FENTANYL CITRATE PF 50 MCG/ML IJ SOSY
12.5000 ug | PREFILLED_SYRINGE | INTRAMUSCULAR | Status: DC | PRN
  Administered 2023-06-29 (×3): 12.5 ug via INTRAVENOUS
  Filled 2023-06-28 (×3): qty 1

## 2023-06-28 MED ORDER — POTASSIUM CHLORIDE CRYS ER 20 MEQ PO TBCR: 40.0000 meq | EXTENDED_RELEASE_TABLET | Freq: Once | ORAL | Status: DC

## 2023-06-28 MED ORDER — HYDRALAZINE HCL 20 MG/ML IJ SOLN: 5.0000 mg | INTRAMUSCULAR | Status: DC | PRN

## 2023-06-28 MED ORDER — DM-GUAIFENESIN ER 30-600 MG PO TB12: 1.0000 | ORAL_TABLET | Freq: Two times a day (BID) | ORAL | Status: DC | PRN

## 2023-06-28 NOTE — Progress Notes (Signed)
 Pharmacy Antibiotic Note  Annette Foster is a 83 y.o. female admitted on 06/28/2023 with HCAP vs aspiration pneumonia.  Pharmacy has been consulted for vancomycin dosing.  Plan: Give vancomycin 1500 mg IV x1 followed by 1000 mg IV Q24H. Goal AUC 400-550. Expected AUC: 516.5 Expected Css min: 13.1 SCr used: 0.8  Weight used: IBW, Vd used: 0.72 (BMI 25.23) Patient is also on Zosyn 3.375 g IV Q8H Continue to monitor renal function and follow culture results   Weight: 64.6 kg (142 lb 6.7 oz)  Temp (24hrs), Avg:97.9 F (36.6 C), Min:97.9 F (36.6 C), Max:97.9 F (36.6 C)  Recent Labs  Lab 06/26/23 1023 06/28/23 1905  WBC  --  2.8*  CREATININE 0.75 0.80    Estimated Creatinine Clearance: 49 mL/min (by C-G formula based on SCr of 0.8 mg/dL).    Allergies  Allergen Reactions   Codeine Itching, Nausea And Vomiting and Other (See Comments)   Demeclocycline Rash   Demerol [Meperidine] Itching and Nausea And Vomiting   Hydrocodone Itching and Nausea And Vomiting   Morphine  Nausea Only   Other Other (See Comments)   Oxycodone  Itching and Nausea And Vomiting   Pentazocine Itching, Nausea And Vomiting and Other (See Comments)   Tetracyclines & Related Rash   Coal Tar Extract Other (See Comments)   Hydrocodone-Acetaminophen  Other (See Comments)   Salicylic Acid Other (See Comments)   Tetracycline Hcl Other (See Comments)    Antimicrobials this admission: 5/17 cefepime x 1 5/17 Vanc >>  5/18 Zosyn >>    Microbiology results: None this admission   Thank you for allowing pharmacy to be a part of this patient's care.  Alice Innocent, PharmD, BCPS 06/28/2023 9:37 PM

## 2023-06-28 NOTE — H&P (Signed)
 History and Physical    Annette Foster ZOX:[id number] DOB: 1940/09/07 DOA: 06/28/2023  Referring MD/NP/PA:   PCP: Westley Hammers, MD   Patient coming from:  The patient is coming from home.     Chief Complaint: AMS  HPI: Annette Foster is a 83 y.o. 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  Patient was recently hospitalized from 4/23 - 5/7 due to worsening ascites. Patient had paracentesis with 4.1 L fluid removed and is s/p TIPS on 4/28.  Per her daughter, patient has been confused since yesterday, difficult to wake up, not following command.  When I saw patient in ED, patient is confused, seems to recognize her daughter, but not orientated to time and the place.  She moves all extremities.  No facial droop.  Per her daughter, patient does not seem to have abdominal pain or chest pain.  No active nausea, vomiting, diarrhea noted.  Her daughter states that patient did have SOB or cough at home, but per ED physician, patient has some cough in ED. Not sure if patient has symptoms of UTI.  Patient fell last weekend, no significant injury.  Patient does not have abdominal distention this time.  Data reviewed independently and ED Course: pt was found to have ammonia 46, pancytopenia with WBC 2.8, hemoglobin 11.1 and platelet 91 (WBC 6.0, hemoglobin 11.1, platelet 114 on 06/16/2023), GFR> 60, potassium 2.9, temperature normal, blood pressure 134/57, heart rate 83, RR 22, oxygen saturation 99% on room air.  CT of head is negative for acute intracranial abnormalities.  Chest x-ray showed multifocal patchy  infiltration particularly on the left side.  Patient is admitted to PCU as inpatient.  Chest x-ray: Multifocal patchy opacities in the left upper lobe and left lower lung, increased in the left lower lung.    EKG: I have personally reviewed.  Sinus rhythm, QTc 527, poor R wave progression, left bundle blockade, anteroseptal infarction pattern, T wave inversion in inferior leads and V5-V6.   Review of Systems: Could not be reviewed due to altered mental status.  Allergy:  Allergies  Allergen Reactions   Codeine Itching, Nausea And Vomiting and Other (See Comments)   Demeclocycline Rash   Demerol [Meperidine] Itching and Nausea And Vomiting   Hydrocodone Itching and Nausea And Vomiting   Morphine  Nausea Only   Other Other (See Comments)   Oxycodone  Itching and Nausea And Vomiting   Pentazocine Itching, Nausea And Vomiting and Other (See Comments)   Tetracyclines & Related Rash   Coal Tar Extract Other (See Comments)   Hydrocodone-Acetaminophen  Other (See Comments)   Salicylic Acid Other (See Comments)   Tetracycline Hcl Other (See Comments)    Past Medical History:  Diagnosis Date   Abdominal pain    Allergy    Cancer (HCC) 2017   breast cancer- Left   Cataract    CHF (congestive heart failure) (HCC)    Collagenous colitis    Coronary artery disease    Diabetes mellitus without complication (HCC)    Diarrhea    Diverticulosis    Dysrhythmia    Fatty liver disease, nonalcoholic    Fibrocystic breast    GERD (gastroesophageal reflux disease)  Heart murmur    Hyperlipidemia    Hypertension    Hypothyroidism    IBS (irritable bowel syndrome)    IDA (iron  deficiency anemia)    Liver cirrhosis (HCC)    Personal history of radiation therapy 2017   LEFT BREAST CA   PONV (postoperative nausea and vomiting)    Presence of permanent cardiac pacemaker    Sleep apnea    C-Pap    Past Surgical History:  Procedure Laterality Date   ABDOMINAL HYSTERECTOMY     [place] bso    ABDOMINAL SURGERY     APPENDECTOMY     AV NODE ABLATION N/A 05/17/2021   Procedure: AV NODE ABLATION;  Surgeon: Boyce Byes, MD;  [facility];  Service: Cardiovascular;  Laterality: N/A;   BREAST BIOPSY Bilateral 2016   negative   BREAST 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/2015   DCIS, papillary carcinoma in situ   BREAST BIOPSY Left 05/27/2016   BENIGN MAMMARY EPITHELIUM   BREAST BIOPSY Left 11/20/2017   affirm bx x clip BENIGN MAMMARY EPITHELIUM CONSISTENT WITH RAD THERAPY   BREAST EXCISIONAL BIOPSY Right    NEG 1980's   BREAST LUMPECTOMY Left 10/17/2015   DCIS and papillary carcinoma insitu, clear margins   CARDIAC SURGERY     has replacement valve   CATARACT EXTRACTION Right    CATARACT EXTRACTION W/PHACO Left 01/16/2021   Procedure: CATARACT EXTRACTION PHACO AND INTRAOCULAR LENS PLACEMENT (IOC) LEFT DIABETIC 8.46 00:59.8;  Surgeon: Clair Crews, MD;  Location: [facility];  Service: Ophthalmology;  Laterality: Left;   CHOLECYSTECTOMY     COLONOSCOPY WITH PROPOFOL  N/A 03/11/2016   Procedure: COLONOSCOPY WITH PROPOFOL ;  Surgeon: Cassie Click, MD;  Location: [facility] ENDOSCOPY;  Service: Endoscopy;  Laterality: N/A;   COLONOSCOPY WITH PROPOFOL  N/A 02/18/2020   Procedure: COLONOSCOPY WITH PROPOFOL ;  Surgeon: Marnee Sink, MD;  Location: [facility] ENDOSCOPY;  Service: Endoscopy;  Laterality: N/A;   COLONOSCOPY WITH PROPOFOL  N/A 09/10/2022   Procedure: COLONOSCOPY WITH PROPOFOL ;  Surgeon: Luke Salaam, MD;  Location: [facility] ENDOSCOPY;  Service: Gastroenterology;  Laterality: N/A;   ESOPHAGEAL BANDING  09/10/2022   Procedure: ESOPHAGEAL BANDING;  Surgeon: Luke Salaam, MD;  Location: [facility] ENDOSCOPY;  Service: Gastroenterology;;   ESOPHAGEAL BANDING  10/17/2022   Procedure: ESOPHAGEAL BANDING;  Surgeon: Marnee Sink, MD;  Location: [facility] ENDOSCOPY;  Service: Endoscopy;;   ESOPHAGEAL BANDING  12/24/2022   Procedure: ESOPHAGEAL BANDING;  Surgeon: Marnee Sink, MD;  Location: [facility]  ENDOSCOPY;  Service: Endoscopy;;   ESOPHAGEAL BANDING  05/13/2023   Procedure: ESOPHAGOSCOPY, WITH ESOPHAGEAL VARICES BAND LIGATION;  Surgeon: Marnee Sink, MD;  Location: [facility] ENDOSCOPY;  Service: Endoscopy;;   ESOPHAGOGASTRODUODENOSCOPY N/A 05/13/2023   Procedure: EGD (ESOPHAGOGASTRODUODENOSCOPY);  Surgeon: Marnee Sink, MD;  Location: [facility] ENDOSCOPY;  Service: Endoscopy;  Laterality: N/A;   ESOPHAGOGASTRODUODENOSCOPY (EGD) WITH PROPOFOL  N/A 03/11/2016   Procedure: ESOPHAGOGASTRODUODENOSCOPY (EGD) WITH PROPOFOL ;  Surgeon: Cassie Click, MD;  Location: [facility] ENDOSCOPY;  Service: Endoscopy;  Laterality: N/A;   ESOPHAGOGASTRODUODENOSCOPY (EGD) WITH PROPOFOL  N/A 02/18/2020   Procedure: ESOPHAGOGASTRODUODENOSCOPY (EGD) WITH PROPOFOL ;  Surgeon: Marnee Sink, MD;  Location: [facility] ENDOSCOPY;  Service: Endoscopy;  Laterality: N/A;   ESOPHAGOGASTRODUODENOSCOPY (EGD) WITH PROPOFOL  N/A 04/25/2020   Procedure: ESOPHAGOGASTRODUODENOSCOPY (EGD) WITH PROPOFOL ;  Surgeon: Marnee Sink, MD;  Location: [facility] ENDOSCOPY;  Service: Endoscopy;  Laterality: N/A;   ESOPHAGOGASTRODUODENOSCOPY (EGD) WITH PROPOFOL  N/A 05/23/2020   Procedure: ESOPHAGOGASTRODUODENOSCOPY (EGD) WITH PROPOFOL ;  Surgeon: Marnee Sink, MD;  Location: [facility] ENDOSCOPY;  Service: Endoscopy;  Laterality:  N/A;   ESOPHAGOGASTRODUODENOSCOPY (EGD) WITH PROPOFOL  N/A 09/10/2022   Procedure: ESOPHAGOGASTRODUODENOSCOPY (EGD) WITH PROPOFOL ;  Surgeon: Luke Salaam, MD;  Location: [facility] ENDOSCOPY;  Service: Gastroenterology;  Laterality: N/A;   ESOPHAGOGASTRODUODENOSCOPY (EGD) WITH PROPOFOL  N/A 10/17/2022   Procedure: ESOPHAGOGASTRODUODENOSCOPY (EGD) WITH PROPOFOL ;  Surgeon: Marnee Sink, MD;  Location: [facility] ENDOSCOPY;  Service: Endoscopy;  Laterality: N/A;   ESOPHAGOGASTRODUODENOSCOPY (EGD) WITH PROPOFOL  N/A 12/24/2022   Procedure: ESOPHAGOGASTRODUODENOSCOPY (EGD) WITH PROPOFOL ;  Surgeon: Marnee Sink, MD;  Location: [facility] ENDOSCOPY;  Service: Endoscopy;  Laterality: N/A;    ESOPHAGOGASTRODUODENOSCOPY (EGD) WITH PROPOFOL  N/A 01/29/2023   Procedure: ESOPHAGOGASTRODUODENOSCOPY (EGD) WITH PROPOFOL ;  Surgeon: Shane Darling, MD;  Location: [facility] ENDOSCOPY;  Service: Endoscopy;  Laterality: N/A;  Ideally we would intubate if possible given concern for varices   EUS N/A 10/04/2021   Procedure: LOWER ENDOSCOPIC ULTRASOUND (EUS);  Surgeon: Rayford Cake, MD;  Location: [facility] ENDOSCOPY;  Service: Gastroenterology;  Laterality: N/A;  [facility]   EYE SURGERY     FINGER ARTHROSCOPY WITH CARPOMETACARPEL ([facility]) ARTHROPLASTY Right 09/03/2018   Procedure: CARPOMETACARPEL [facility]) ARTHROPLASTY RIGHT THUMB;  Surgeon: Molli Angelucci, MD;  Location: [facility] ORS;  Service: Orthopedics;  Laterality: Right;   FLEXIBLE SIGMOIDOSCOPY N/A 09/14/2021   Procedure: FLEXIBLE SIGMOIDOSCOPY;  Surgeon: Marnee Sink, MD;  Location: [facility] ENDOSCOPY;  Service: Endoscopy;  Laterality: N/A;  No anesthesia   GANGLION CYST EXCISION Right 09/03/2018   Procedure: REMOVAL GANGLION OF WRIST;  Surgeon: Molli Angelucci, MD;  Location: [facility] ORS;  Service: Orthopedics;  Laterality: Right;   HARDWARE REMOVAL Right 09/03/2018   Procedure: HARDWARE REMOVAL RIGHT THUMB;  Surgeon: Molli Angelucci, MD;  Location: [facility] ORS;  Service: Orthopedics;  Laterality: Right;  staple removed   HEMOSTASIS CLIP PLACEMENT  09/10/2022   Procedure: HEMOSTASIS CLIP PLACEMENT;  Surgeon: Luke Salaam, MD;  Location: [facility] ENDOSCOPY;  Service: Gastroenterology;;   HEMOSTASIS CONTROL  09/10/2022   Procedure: HEMOSTASIS CONTROL;  Surgeon: Luke Salaam, MD;  Location: [facility] ENDOSCOPY;  Service: Gastroenterology;;   HEMOSTASIS CONTROL  01/29/2023   Procedure: HEMOSTASIS CONTROL;  Surgeon: Shane Darling, MD;  Location: [facility] ENDOSCOPY;  Service: Endoscopy;;   IR PARACENTESIS  06/09/2023   IR TIPS  06/09/2023   JOINT REPLACEMENT Left    TKR   left sinusplasty      MASTECTOMY, PARTIAL Left 10/17/2015   Procedure: MASTECTOMY PARTIAL REVISION;   Surgeon: Benancio Bracket, MD;  Location: [facility] ORS;  Service: General;  Laterality: Left;   PACEMAKER IMPLANT N/A 03/23/2021   Procedure: PACEMAKER IMPLANT;  Surgeon: Boyce Byes, MD;  [facility];  Service: Cardiovascular;  Laterality: N/A;   PARTIAL MASTECTOMY WITH NEEDLE LOCALIZATION Left 09/29/2015   Procedure: PARTIAL MASTECTOMY WITH NEEDLE LOCALIZATION;  Surgeon: Benancio Bracket, MD;  Location: [facility] ORS;  Service: General;  Laterality: Left;   POLYPECTOMY  09/10/2022   Procedure: POLYPECTOMY;  Surgeon: Luke Salaam, MD;  Location: [facility] ENDOSCOPY;  Service: Gastroenterology;;   RECTAL EXAM UNDER ANESTHESIA N/A 10/05/2021   Procedure: RECTAL EXAM UNDER ANESTHESIA, ASPIRATION OF RECTAL CYST;  Surgeon: Eldred Grego, MD;  Location: [facility] ORS;  Service: General;  Laterality: N/A;   TOTAL ABDOMINAL HYSTERECTOMY W/ BILATERAL SALPINGOOPHORECTOMY      Social History:  reports that she has never smoked. She has never used smokeless tobacco. She reports that she does not drink alcohol and does not use drugs.  Family History:  Family History  Problem Relation Age of Onset   Breast cancer Paternal Grandmother  Colon cancer Father    Diabetes Sister    Diabetes Brother    Heart disease Brother    Prostate cancer Brother    Colon cancer Maternal Uncle    Prostate cancer Brother    Bladder Cancer Brother    Leukemia Mother        all   Ovarian cancer Neg Hx    Kidney cancer Neg Hx      Prior to Admission medications   Medication Sig Start Date End Date Taking? Authorizing Provider  albuterol  (VENTOLIN  HFA) 108 (90 Base) MCG/ACT inhaler Inhale 2 puffs into the lungs every 4 (four) hours as needed for wheezing or shortness of breath. 04/30/23 04/29/24  [provider]  Cholecalciferol  (VITAMIN D ) 50 MCG (2000 UT) CAPS Take 2,000 Units by mouth daily.    [provider]  clobetasol  ointment (TEMOVATE ) 0.05 % Apply 1 application topically as  needed (vaginal irritation).    [provider]  clotrimazole  (LOTRIMIN ) 1 % cream Apply 1 Application topically 2 (two) times daily as needed.    [provider]  cyanocobalamin (,VITAMIN B-12,) 1000 MCG/ML injection 1,000 mcg every 30 (thirty) days. 04/23/19   [provider]  esomeprazole (NEXIUM) 40 MG capsule Take 40 mg by mouth 2 (two) times daily before a meal.  02/08/16   [provider]  fenofibrate  160 MG tablet Take 160 mg by mouth at bedtime.    [provider]  gabapentin  (NEURONTIN ) 300 MG capsule Take 300 mg by mouth 2 (two) times daily. 09/07/16 10/25/36  Westley Hammers, MD  insulin  aspart (NOVOLOG ) 100 UNIT/ML FlexPen Inject 2 Units into the skin 3 (three) times daily with meals. If eating and Blood Glucose (BG) 80 or higher inject 2 units for meal coverage and add correction dose per scale. If not eating, correction dose only. BG <150= 0 unit; BG 150-200= 1 unit; BG 201-250= 2 unit; BG 251-300= 3 unit; BG 301-350= 4 unit; BG 351-400= 5 unit; BG >400= 6 unit and Call Primary care. 06/18/23   Verla Glaze, MD  insulin  degludec (TRESIBA  FLEXTOUCH) 200 UNIT/ML FlexTouch Pen Inject 6 Units into the skin at bedtime. 06/18/23   Verla Glaze, MD  ipratropium (ATROVENT) 0.03 % nasal spray Place 1 spray into both nostrils 2 (two) times daily as needed for rhinitis. 04/15/23 04/14/24  [provider]  lactulose  (CHRONULAC ) 10 GM/15ML solution Take 45 mLs (30 g total) by mouth daily. 06/18/23   Verla Glaze, MD  levocetirizine (XYZAL) 5 MG tablet Take 5 mg by mouth every evening.    [provider]  levothyroxine  (SYNTHROID , LEVOTHROID) 75 MCG tablet Take 75 mcg by mouth daily before breakfast.     [provider]  magnesium  oxide (MAG-OX) 400 MG tablet Take 400 mg by mouth 2 (two) times daily.     [provider]  midodrine  (PROAMATINE ) 10 MG tablet Take 1 tablet (10 mg total) by mouth 3 (three) times daily with meals.  06/18/23   Verla Glaze, MD  nadolol  (CORGARD ) 20 MG tablet Take 1 tablet (20 mg total) by mouth daily. 12/20/22   End, Veryl Gottron, MD  ondansetron  (ZOFRAN ) 4 MG tablet Take 1 tablet (4 mg total) by mouth every 6 (six) hours as needed for nausea. 06/18/23   Verla Glaze, MD  potassium chloride  SA (KLOR-CON  M20) 20 MEQ tablet Take 1 tablet (20 mEq total) by mouth daily. 06/27/23 09/25/23  End, Veryl Gottron, MD  simvastatin  (ZOCOR ) 20 MG tablet Take 20 mg by  mouth at bedtime.    [provider]  spironolactone  (ALDACTONE ) 50 MG tablet Take 1.5 tablets (75 mg total) by mouth daily. 06/27/23   End, Veryl Gottron, MD  torsemide  (DEMADEX ) 20 MG tablet Take 3 tablets (60 mg total) by mouth daily. 06/26/23   End, Veryl Gottron, MD  traMADol  (ULTRAM ) 50 MG tablet Take 1 tablet (50 mg total) by mouth every 8 (eight) hours as needed. Patient taking differently: Take 50 mg by mouth every 8 (eight) hours as needed for moderate pain (pain score 4-6). 06/04/23   Brahmanday, Govinda R, MD  valACYclovir  (VALTREX ) 500 MG tablet Take 500 mg by mouth 2 (two) times daily as needed. 02/10/23   [provider]    Physical Exam: Vitals:   06/28/23 1900 06/28/23 1903  BP:  (!) (P) 134/57  Pulse:  (P) 83  Resp:  (!) (P) 22  Temp:  (P) 97.9 F (36.6 C)  TempSrc:  (P) Oral  SpO2:  (P) 99%  Weight: 64.6 kg    General: Not in acute distress HEENT:       Eyes: PERRL, EOMI, no jaundice       ENT: No discharge from the ears and nose       Neck: No JVD, no bruit, no mass felt. Heme: No neck lymph node enlargement. Cardiac: S1/S2, RRR, No murmurs, No gallops or rubs. Respiratory: No rales, wheezing, rhonchi or rubs. GI: Soft, nondistended, nontender, no organomegaly, BS present. GU: No hematuria Ext: 1+ pitting leg edema bilaterally. 1+DP/PT pulse bilaterally. Musculoskeletal: No joint deformities, No joint redness or warmth, no limitation of ROM in spin. Skin: No rashes.  Neuro: Confused, seem to  recognize her daughter, not oriented to time and place, not following command, moves all extremities, no facial droop. Psych: Patient is not psychotic  Labs on Admission: I have personally reviewed following labs and imaging studies  CBC: Recent Labs  Lab 06/28/23 1905  WBC 2.8*  HGB 11.1*  HCT 32.4*  MCV 94.7  PLT 91*   Basic Metabolic Panel: Recent Labs  Lab 06/26/23 1023 06/28/23 1905  NA 142 142  K 3.2* 2.9*  CL 99 104  CO2 29 28  GLUCOSE 151* 125*  BUN 13 16  CREATININE 0.75 0.80  CALCIUM 9.2 8.8*  MG  --  2.4  PHOS  --  3.3   GFR: Estimated Creatinine Clearance: 49 mL/min (by C-G formula based on SCr of 0.8 mg/dL). Liver Function Tests: Recent Labs  Lab 06/28/23 1905  AST 46*  ALT 21  ALKPHOS 56  BILITOT 3.0*  PROT 6.7  ALBUMIN  3.3*   No results for input(s): "LIPASE", "AMYLASE" in the last 168 hours. Recent Labs  Lab 06/28/23 2004  AMMONIA 46*   Coagulation Profile: No results for input(s): "INR", "PROTIME" in the last 168 hours. Cardiac Enzymes: No results for input(s): "CKTOTAL", "CKMB", "CKMBINDEX", "TROPONINI" in the last 168 hours. BNP (last 3 results) No results for input(s): "PROBNP" in the last 8760 hours. HbA1C: No results for input(s): "HGBA1C" in the last 72 hours. CBG: Recent Labs  Lab 06/28/23 2003 06/28/23 2242  GLUCAP 106* 95   Lipid Profile: No results for input(s): "CHOL", "HDL", "LDLCALC", "TRIG", "CHOLHDL", "LDLDIRECT" in the last 72 hours. Thyroid  Function Tests: No results for input(s): "TSH", "T4TOTAL", "FREET4", "T3FREE", "THYROIDAB" in the last 72 hours. Anemia Panel: No results for input(s): "VITAMINB12", "FOLATE", "FERRITIN", "TIBC", "IRON ", "RETICCTPCT" in the last 72 hours. Urine analysis:    Component Value Date/Time   COLORURINE  YELLOW (A) 06/28/2023 2020   APPEARANCEUR CLEAR (A) 06/28/2023 2020   APPEARANCEUR Cloudy (A) 07/08/2017 1103   LABSPEC 1.011 06/28/2023 2020   PHURINE 8.0 06/28/2023 2020    GLUCOSEU NEGATIVE 06/28/2023 2020   HGBUR NEGATIVE 06/28/2023 2020   BILIRUBINUR NEGATIVE 06/28/2023 2020   BILIRUBINUR neg 10/10/2020 1112   BILIRUBINUR Negative 07/08/2017 1103   KETONESUR NEGATIVE 06/28/2023 2020   PROTEINUR NEGATIVE 06/28/2023 2020   UROBILINOGEN 0.2 10/10/2020 1112   NITRITE NEGATIVE 06/28/2023 2020   LEUKOCYTESUR NEGATIVE 06/28/2023 2020   Sepsis Labs: @LABRCNTIP (procalcitonin:4,lacticidven:4) )No results found for this or any previous visit (from the past 240 hours).   Radiological Exams on Admission:   Assessment/Plan Principal Problem:   Aspiration pneumonia (HCC) Active Problems:   Acute hepatic encephalopathy (HCC)   Decompensated nonalcoholic liver cirrhosis (HCC)   Pancytopenia (HCC)   Hypokalemia   Hypothyroidism, unspecified   HTN (hypertension)   Hyperlipidemia, unspecified   Diabetes mellitus without complication (HCC)   Chronic diastolic CHF (congestive heart failure) (HCC)   OSA on CPAP   Assessment and Plan:  Aspiration pneumonia [facility]): Chest x-ray showed multifocal infiltration, particularly in the left side of the lung, likely due to aspiration pneumonia.  Patient does not have fever, clinically does not seem to have sepsis.  No oxygen desaturation.  - Will admit to PCU as inpt - Abx: Unasyn (patient received 1 dose of vancomycin and cefepime in ED) - Mucinex  for cough  - Bronchodilators - Urine S. pneumococcal antigen - Follow up blood culture x2, sputum culture - Palliative consult  Acute hepatic encephalopathy (HCC): Patient's altered mental status is likely due to hepatic encephalopathy.  CT head negative.  No focal neurodeficit on physical examination.  Ammonia is elevated 46. -Lactulose  enema twice daily - Frequent neurocheck - Fall precaution - Hold all oral medications until mental status improves  Decompensated nonalcoholic liver cirrhosis (HCC): S/p of TIPS, no abdominal distention today. -Check INR/PTT - Hold home  nadolol , torsemide  spironolactone   Pancytopenia (HCC): Hemoglobin stable -Follow-up with CBC  Hypokalemia: Potassium 2.9, magnesium  2.4, phosphorus 3.3 -Repleted potassium with IV potassium chloride   Hypothyroidism, unspecified -Synthroid   HTN (hypertension) -Hydralazine  as needed - Hold home nadolol , torsemide , spironolactone   Hyperlipidemia, unspecified -Hold Zocor  and fenofibrate   Diabetes mellitus without complication (HCC): Recent A1c 5.6, well-controlled.  Patient is taking Tresiba  60 units daily -Sliding scale insulin  - Glargine insulin  4 units daily  Chronic diastolic CHF (congestive heart failure) [facility]): Patient has 1+ leg edema, mildly elevated BNP 226, indicating mild fluid overload.  Patient does not have oxygen desaturation.  Patient is on n.p.o., will hold off diuretics now. -Watch volume status closely  OSA  - Hold  CPAP due to altered mental status.     DVT ppx: SCD  Code Status: Full code per her daughter  Family Communication:     Yes, patient's daughter    at bed side.    Disposition Plan:  Anticipate discharge back to previous environment  Consults called:  none  Admission status and Level of care: Progressive:   as inpt        Dispo: The patient is from: Home              Anticipated d/c is to: Home              Anticipated d/c date is: 2 days              Patient currently is not medically stable to d/c.  Severity of Illness:  The appropriate patient status for this patient is INPATIENT. Inpatient status is judged to be reasonable and necessary in order to provide the required intensity of service to ensure the patient's safety. The patient's presenting symptoms, physical exam findings, and initial radiographic and laboratory data in the context of their chronic comorbidities is felt to place them at high risk for further clinical deterioration. Furthermore, it is not anticipated that the patient will be medically stable for discharge from  the hospital within 2 midnights of admission.   * I certify that at the point of admission it is my clinical judgment that the patient will require inpatient hospital care spanning beyond 2 midnights from the point of admission due to high intensity of service, high risk for further deterioration and high frequency of surveillance required.*       Date of Service 06/28/2023    Fidencio Hue [facility]   If 7PM-7AM, please contact night-coverage [email or web address] 06/28/2023, 11:02 PM

## 2023-06-28 NOTE — ED Provider Notes (Signed)
 [facility] Provider Note    Event Date/Time   First MD Initiated Contact with Patient 06/28/23 1855     (approximate)   History   Chief Complaint: Altered Mental Status   HPI  Annette Foster is a 83 y.o. female with a history of CHF, diabetes, hypertension, liver cirrhosis status post TIPS 3 weeks ago who was brought to the ED due to decreased level of consciousness today.  Family noted that patient was starting to become confused yesterday, and this morning she is difficult to arouse, not alert.  Reviewed outside records noting that patient was seen in cardiology office 2 days ago, relatively benign exam at that time.  It was noted that she had a fall at home last weekend which family member at bedside confirms currently.  Patient has been having a productive cough recently as well.         Past Medical History:  Diagnosis Date   Abdominal pain    Allergy    Cancer (HCC) 2017   breast cancer- Left   Cataract    CHF (congestive heart failure) (HCC)    Collagenous colitis    Coronary artery disease    Diabetes mellitus without complication (HCC)    Diarrhea    Diverticulosis    Dysrhythmia    Fatty liver disease, nonalcoholic    Fibrocystic breast    GERD (gastroesophageal reflux disease)    Heart murmur    Hyperlipidemia    Hypertension    Hypothyroidism    IBS (irritable bowel syndrome)    IDA (iron  deficiency anemia)    Liver cirrhosis (HCC)    Personal history of radiation therapy 2017   LEFT BREAST CA   PONV (postoperative nausea and vomiting)    Presence of permanent cardiac pacemaker    Sleep apnea    C-Pap    Current Outpatient Rx   Order #: [id number] Class: Historical Med   Order #: [id number] Class: Historical Med   Order #: [id number] Class: Historical Med   Order #: [id number] Class: Historical Med   Order #: [id number] Class: Historical Med   Order #: [id number] Class: Historical Med   Order #: [id number] Class: Historical  Med   Order #: [id number] Class: Historical Med   Order #: [id number] Class: No Print   Order #: [id number] Class: No Print   Order #: [id number] Class: Historical Med   Order #: [id number] Class: Normal   Order #: [id number] Class: Historical Med   Order #: [id number] Class: Historical Med   Order #: [id number] Class: Historical Med   Order #: [id number] Class: Normal   Order #: [id number] Class: Normal   Order #: [id number] Class: Normal   Order #: [id number] Class: Normal   Order #: [id number] Class: Historical Med   Order #: [id number] Class: Normal   Order #: [id number] Class: Normal   Order #: [id number] Class: Normal   Order #: [id number] Class: Historical Med    Past Surgical History:  Procedure Laterality Date   ABDOMINAL HYSTERECTOMY     [place] bso   ABDOMINAL SURGERY     APPENDECTOMY     AV NODE ABLATION N/A 05/17/2021   Procedure: AV NODE ABLATION;  Surgeon: Boyce Byes, MD;  Location: [facility] INVASIVE CV LAB;  Service: Cardiovascular;  Laterality: N/A;   BREAST BIOPSY Bilateral 2016   negative   BREAST 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/2015   DCIS, papillary carcinoma in situ   BREAST BIOPSY Left 05/27/2016   BENIGN MAMMARY EPITHELIUM   BREAST BIOPSY Left 11/20/2017   affirm bx x clip BENIGN MAMMARY EPITHELIUM CONSISTENT WITH RAD THERAPY  BREAST EXCISIONAL BIOPSY Right    NEG 1980's   BREAST LUMPECTOMY Left 10/17/2015   DCIS and papillary carcinoma insitu, clear margins   CARDIAC SURGERY     has replacement valve   CATARACT EXTRACTION Right    CATARACT EXTRACTION W/PHACO Left 01/16/2021   Procedure: CATARACT EXTRACTION PHACO AND INTRAOCULAR LENS PLACEMENT (IOC) LEFT DIABETIC 8.46 00:59.8;  Surgeon: Clair Crews, MD;  Location: [facility];  Service: Ophthalmology;  Laterality: Left;   CHOLECYSTECTOMY     COLONOSCOPY WITH PROPOFOL  N/A 03/11/2016   Procedure: COLONOSCOPY WITH PROPOFOL ;  Surgeon: Cassie Click, MD;  Location: [facility] ENDOSCOPY;  Service: Endoscopy;  Laterality: N/A;   COLONOSCOPY  WITH PROPOFOL  N/A 02/18/2020   Procedure: COLONOSCOPY WITH PROPOFOL ;  Surgeon: Marnee Sink, MD;  Location: [facility] ENDOSCOPY;  Service: Endoscopy;  Laterality: N/A;   COLONOSCOPY WITH PROPOFOL  N/A 09/10/2022   Procedure: COLONOSCOPY WITH PROPOFOL ;  Surgeon: Luke Salaam, MD;  Location: [facility] ENDOSCOPY;  Service: Gastroenterology;  Laterality: N/A;   ESOPHAGEAL BANDING  09/10/2022   Procedure: ESOPHAGEAL BANDING;  Surgeon: Luke Salaam, MD;  Location: [facility] ENDOSCOPY;  Service: Gastroenterology;;   ESOPHAGEAL BANDING  10/17/2022   Procedure: ESOPHAGEAL BANDING;  Surgeon: Marnee Sink, MD;  Location: [facility] ENDOSCOPY;  Service: Endoscopy;;   ESOPHAGEAL BANDING  12/24/2022   Procedure: ESOPHAGEAL BANDING;  Surgeon: Marnee Sink, MD;  Location: [facility] ENDOSCOPY;  Service: Endoscopy;;   ESOPHAGEAL BANDING  05/13/2023   Procedure: ESOPHAGOSCOPY, WITH ESOPHAGEAL VARICES BAND LIGATION;  Surgeon: Marnee Sink, MD;  Location: [facility] ENDOSCOPY;  Service: Endoscopy;;   ESOPHAGOGASTRODUODENOSCOPY N/A 05/13/2023   Procedure: EGD (ESOPHAGOGASTRODUODENOSCOPY);  Surgeon: Marnee Sink, MD;  Location: [facility] ENDOSCOPY;  Service: Endoscopy;  Laterality: N/A;   ESOPHAGOGASTRODUODENOSCOPY (EGD) WITH PROPOFOL  N/A 03/11/2016   Procedure: ESOPHAGOGASTRODUODENOSCOPY (EGD) WITH PROPOFOL ;  Surgeon: Cassie Click, MD;  Location: [facility] ENDOSCOPY;  Service: Endoscopy;  Laterality: N/A;   ESOPHAGOGASTRODUODENOSCOPY (EGD) WITH PROPOFOL  N/A 02/18/2020   Procedure: ESOPHAGOGASTRODUODENOSCOPY (EGD) WITH PROPOFOL ;  Surgeon: Marnee Sink, MD;  Location: [facility] ENDOSCOPY;  Service: Endoscopy;  Laterality: N/A;   ESOPHAGOGASTRODUODENOSCOPY (EGD) WITH PROPOFOL  N/A 04/25/2020   Procedure: ESOPHAGOGASTRODUODENOSCOPY (EGD) WITH PROPOFOL ;  Surgeon: Marnee Sink, MD;  Location: [facility] ENDOSCOPY;  Service: Endoscopy;  Laterality: N/A;   ESOPHAGOGASTRODUODENOSCOPY (EGD) WITH PROPOFOL  N/A 05/23/2020   Procedure: ESOPHAGOGASTRODUODENOSCOPY (EGD) WITH PROPOFOL ;   Surgeon: Marnee Sink, MD;  Location: [facility] ENDOSCOPY;  Service: Endoscopy;  Laterality: N/A;   ESOPHAGOGASTRODUODENOSCOPY (EGD) WITH PROPOFOL  N/A 09/10/2022   Procedure: ESOPHAGOGASTRODUODENOSCOPY (EGD) WITH PROPOFOL ;  Surgeon: Luke Salaam, MD;  Location: [facility] ENDOSCOPY;  Service: Gastroenterology;  Laterality: N/A;   ESOPHAGOGASTRODUODENOSCOPY (EGD) WITH PROPOFOL  N/A 10/17/2022   Procedure: ESOPHAGOGASTRODUODENOSCOPY (EGD) WITH PROPOFOL ;  Surgeon: Marnee Sink, MD;  Location: [facility] ENDOSCOPY;  Service: Endoscopy;  Laterality: N/A;   ESOPHAGOGASTRODUODENOSCOPY (EGD) WITH PROPOFOL  N/A 12/24/2022   Procedure: ESOPHAGOGASTRODUODENOSCOPY (EGD) WITH PROPOFOL ;  Surgeon: Marnee Sink, MD;  Location: [facility] ENDOSCOPY;  Service: Endoscopy;  Laterality: N/A;   ESOPHAGOGASTRODUODENOSCOPY (EGD) WITH PROPOFOL  N/A 01/29/2023   Procedure: ESOPHAGOGASTRODUODENOSCOPY (EGD) WITH PROPOFOL ;  Surgeon: Shane Darling, MD;  Location: [facility] ENDOSCOPY;  Service: Endoscopy;  Laterality: N/A;  Ideally we would intubate if possible given concern for varices   EUS N/A 10/04/2021   Procedure: LOWER ENDOSCOPIC ULTRASOUND (EUS);  Surgeon: Rayford Cake, MD;  Location: [facility] ENDOSCOPY;  Service: Gastroenterology;  Laterality: N/A;  [facility]   EYE SURGERY     FINGER ARTHROSCOPY WITH CARPOMETACARPEL (CMC) ARTHROPLASTY Right 09/03/2018   Procedure: CARPOMETACARPEL (CMC) ARTHROPLASTY RIGHT THUMB;  Surgeon: Molli Angelucci, MD;  Location: [facility] ORS;  Service: Orthopedics;  Laterality: Right;   FLEXIBLE SIGMOIDOSCOPY N/A 09/14/2021   Procedure: FLEXIBLE SIGMOIDOSCOPY;  Surgeon: Marnee Sink, MD;  Location: [facility] ENDOSCOPY;  Service: Endoscopy;  Laterality: N/A;  No anesthesia   GANGLION CYST EXCISION Right 09/03/2018   Procedure: REMOVAL GANGLION OF WRIST;  Surgeon: Molli Angelucci, MD;  Location: [facility] ORS;  Service: Orthopedics;  Laterality: Right;   HARDWARE REMOVAL Right 09/03/2018   Procedure: HARDWARE REMOVAL RIGHT THUMB;  Surgeon:  Molli Angelucci, MD;  Location: [facility] ORS;  Service: Orthopedics;  Laterality: Right;  staple removed   HEMOSTASIS CLIP PLACEMENT  09/10/2022   Procedure: HEMOSTASIS CLIP PLACEMENT;  Surgeon: Luke Salaam, MD;  Location: [facility] ENDOSCOPY;  Service: Gastroenterology;;   HEMOSTASIS CONTROL  09/10/2022   Procedure: HEMOSTASIS CONTROL;  Surgeon: Luke Salaam, MD;  Location: [facility] ENDOSCOPY;  Service: Gastroenterology;;   HEMOSTASIS CONTROL  01/29/2023   Procedure: HEMOSTASIS CONTROL;  Surgeon: Shane Darling, MD;  Location: [facility] ENDOSCOPY;  Service: Endoscopy;;   IR PARACENTESIS  06/09/2023   IR TIPS  06/09/2023   JOINT REPLACEMENT Left    TKR   left sinusplasty      MASTECTOMY, PARTIAL Left 10/17/2015   Procedure: MASTECTOMY PARTIAL REVISION;  Surgeon: Benancio Bracket, MD;  Location: [facility] ORS;  Service: General;  Laterality: Left;   PACEMAKER IMPLANT N/A 03/23/2021   Procedure: PACEMAKER IMPLANT;  Surgeon: Boyce Byes, MD;  Location: [facility] INVASIVE CV LAB;  Service: Cardiovascular;  Laterality: N/A;   PARTIAL MASTECTOMY WITH NEEDLE LOCALIZATION Left 09/29/2015   Procedure: PARTIAL MASTECTOMY WITH NEEDLE LOCALIZATION;  Surgeon: Benancio Bracket, MD;  Location: [facility] ORS;  Service: General;  Laterality: Left;   POLYPECTOMY  09/10/2022   Procedure: POLYPECTOMY;  Surgeon: Luke Salaam, MD;  Location: [facility] ENDOSCOPY;  Service: Gastroenterology;;   RECTAL EXAM UNDER ANESTHESIA N/A 10/05/2021   Procedure: RECTAL EXAM UNDER ANESTHESIA, ASPIRATION OF RECTAL CYST;  Surgeon: Eldred Grego, MD;  Location: [facility] ORS;  Service: General;  Laterality: N/A;   TOTAL ABDOMINAL HYSTERECTOMY W/ BILATERAL SALPINGOOPHORECTOMY      Physical Exam   Triage Vital Signs: ED Triage Vitals  Encounter Vitals Group     BP 06/28/23 1903 (!) (P) 134/57     Systolic BP Percentile --      Diastolic BP Percentile --      Pulse Rate 06/28/23 1903 (P) 83     Resp 06/28/23 1903 (!) (P) 22     Temp 06/28/23 1903 (P)  97.9 F (36.6 C)     Temp Source 06/28/23 1903 (P) Oral     SpO2 06/28/23 1903 (P) 99 %     Weight 06/28/23 1900 142 lb 6.7 oz (64.6 kg)     Height --      Head Circumference --      Peak Flow --      Pain Score --      Pain Loc --      Pain Education --      Exclude from Growth Chart --     Most recent vital signs: Vitals:   06/28/23 1903  BP: (!) (P) 134/57  Pulse: (P) 83  Resp: (!) (P) 22  Temp: (P) 97.9 F (36.6 C)  SpO2: (P) 99%    General: Somnolent.  Arousable but not interactive CV:  Good peripheral perfusion.  Regular rate and rhythm Resp:  Normal effort.  Clear to auscultation bilaterally Abd:  No distention.  Soft nontender  Other:  2+ pitting edema bilateral lower extremities.  PERRL.   ED Results / Procedures / Treatments   Labs (all labs ordered are listed, but only abnormal results are displayed) Labs Reviewed  COMPREHENSIVE METABOLIC PANEL WITH GFR - Abnormal; Notable for the following components:      Result Value   Potassium 2.9 (*)    Glucose, Bld 125 (*)    Calcium 8.8 (*)    Albumin  3.3 (*)    AST 46 (*)    Total Bilirubin 3.0 (*)    All other components within normal limits  CBC - Abnormal; Notable for the following components:   WBC 2.8 (*)    RBC 3.42 (*)    Hemoglobin 11.1 (*)    HCT 32.4 (*)    RDW 16.5 (*)    Platelets 91 (*)    All other components within normal limits  URINALYSIS, ROUTINE W REFLEX MICROSCOPIC - Abnormal; Notable for the following components:   Color, Urine YELLOW (*)    APPearance CLEAR (*)    All other components within normal limits  AMMONIA - Abnormal; Notable for the following components:   Ammonia 46 (*)    All other components within normal limits  CBG MONITORING, ED - Abnormal; Notable for the following components:   Glucose-Capillary 106 (*)    All other components within normal limits  CULTURE, BLOOD (ROUTINE X 2)  CULTURE, BLOOD (ROUTINE X 2)  EXPECTORATED SPUTUM ASSESSMENT W GRAM STAIN, RFLX TO  RESP C  LACTIC ACID, PLASMA  LACTIC ACID, PLASMA  BRAIN NATRIURETIC PEPTIDE  PROTIME-INR  APTT  MAGNESIUM   PHOSPHORUS  LEGIONELLA PNEUMOPHILA SEROGP 1 UR AG  STREP PNEUMONIAE URINARY ANTIGEN  COMPREHENSIVE METABOLIC PANEL WITH GFR  CBC     EKG Interpreted by me Sinus rhythm rate of 79.  Normal axis, mildly prolonged QTc of 527 ms.  Left bundle branch block.  1 PVC on the strip.  No acute ischemic changes.   RADIOLOGY CT head interpreted by me, negative for intracranial hemorrhage.  Radiology report reviewed   PROCEDURES:  Procedures   MEDICATIONS ORDERED IN ED: Medications  ceFEPIme (MAXIPIME) 2 g in sodium chloride  0.9 % 100 mL IVPB (2 g Intravenous New Bag/Given 06/28/23 2131)  albuterol  (PROVENTIL ) (2.5 MG/3ML) 0.083% nebulizer solution 2.5 mg (has no administration in time range)  dextromethorphan-guaiFENesin  (MUCINEX  DM) 30-600 MG per 12 hr tablet 1 tablet (has no administration in time range)  diphenhydrAMINE (BENADRYL) injection 12.5 mg (has no administration in time range)  acetaminophen  (TYLENOL ) tablet 650 mg (has no administration in time range)  piperacillin-tazobactam (ZOSYN) IVPB 3.375 g (has no administration in time range)  hydrALAZINE  (APRESOLINE ) injection 5 mg (has no administration in time range)  insulin  aspart (novoLOG ) injection 0-9 Units (has no administration in time range)  insulin  aspart (novoLOG ) injection 0-5 Units (has no administration in time range)  lactulose  (CHRONULAC ) 10 GM/15ML solution 30 g (has no administration in time range)  potassium chloride  10 mEq in 100 mL IVPB (has no administration in time range)  potassium chloride  SA (KLOR-CON  M) CR tablet 40 mEq (has no administration in time range)  vancomycin (VANCOREADY) IVPB 1500 mg/300 mL (has no administration in time range)  vancomycin (VANCOCIN) IVPB 1000 mg/200 mL premix (has no administration in time range)     IMPRESSION / MDM / ASSESSMENT AND PLAN / ED COURSE  I reviewed  the triage vital signs and the nursing notes.  DDx: AKI, electrolyte derangement, hyperammonemia/hepatic encephalopathy, UTI, pneumonia,  intracranial hemorrhage  Patient's presentation is most consistent with acute presentation with potential threat to life or bodily function.  Patient presents with altered mental status, somnolence which began yesterday and worse today.  Will check labs, CT head, chest x-ray.   Clinical Course as of 06/28/23 2157  Sat Jun 28, 2023  2112 Chest x-ray reveals multifocal pneumonia.  With recent hospitalzation and TIPS, will treat for HCAP.  Lab panel is reassuring except for leukopenia. [PS]    Clinical Course User Index [PS] Jacquie Maudlin, MD    ----------------------------------------- 9:56 PM on 06/28/2023 ----------------------------------------- Case discussed with hospitalist.  Workup reveals multifocal pneumonia, leukopenia.  Will check blood culture and lactate, start broad-spectrum antibiotics for potential HCAP/aspiration and pneumonia.   FINAL CLINICAL IMPRESSION(S) / ED DIAGNOSES   Final diagnoses:  Multifocal pneumonia  Acute encephalopathy  Chronic liver disease and cirrhosis (HCC)     Rx / DC Orders   ED Discharge Orders     None        Note:  This document was prepared using Dragon voice recognition software and may include unintentional dictation errors.   Jacquie Maudlin, MD 06/28/23 2157

## 2023-06-28 NOTE — ED Triage Notes (Signed)
 Arrives from home via [facility].. Per report, family noticed that this morning when patient woke up, patient was lethargic, not verbally responding, no purposeful movement.  Patient had a tips procedure in April.  Patient withdraws from pain.  Paced rhythm 70 160 84 100% RA CBG 124 RR: 17

## 2023-06-29 ENCOUNTER — Encounter: Payer: Self-pay | Admitting: Internal Medicine

## 2023-06-29 DIAGNOSIS — Z7189 Other specified counseling: Secondary | ICD-10-CM | POA: Diagnosis not present

## 2023-06-29 DIAGNOSIS — Z515 Encounter for palliative care: Secondary | ICD-10-CM

## 2023-06-29 DIAGNOSIS — J189 Pneumonia, unspecified organism: Secondary | ICD-10-CM

## 2023-06-29 DIAGNOSIS — G9340 Encephalopathy, unspecified: Secondary | ICD-10-CM

## 2023-06-29 DIAGNOSIS — K7460 Unspecified cirrhosis of liver: Secondary | ICD-10-CM | POA: Diagnosis not present

## 2023-06-29 DIAGNOSIS — K7682 Hepatic encephalopathy: Secondary | ICD-10-CM | POA: Diagnosis not present

## 2023-06-29 DIAGNOSIS — K7290 Hepatic failure, unspecified without coma: Secondary | ICD-10-CM | POA: Diagnosis not present

## 2023-06-29 DIAGNOSIS — K769 Liver disease, unspecified: Secondary | ICD-10-CM

## 2023-06-29 LAB — COMPREHENSIVE METABOLIC PANEL WITH GFR
ALT: 26 U/L (ref 0–44)
AST: 48 U/L — ABNORMAL HIGH (ref 15–41)
Albumin: 3 g/dL — ABNORMAL LOW (ref 3.5–5.0)
Alkaline Phosphatase: 51 U/L (ref 38–126)
Anion gap: 9 (ref 5–15)
BUN: 15 mg/dL (ref 8–23)
CO2: 23 mmol/L (ref 22–32)
Calcium: 8.7 mg/dL — ABNORMAL LOW (ref 8.9–10.3)
Chloride: 112 mmol/L — ABNORMAL HIGH (ref 98–111)
Creatinine, Ser: 0.61 mg/dL (ref 0.44–1.00)
GFR, Estimated: >60 mL/min (ref >60)
Glucose, Bld: 124 mg/dL — ABNORMAL HIGH (ref 70–99)
Potassium: 3.7 mmol/L (ref 3.5–5.1)
Sodium: 144 mmol/L (ref 135–145)
Total Bilirubin: 3.4 mg/dL — ABNORMAL HIGH (ref 0.0–1.2)
Total Protein: 6.3 g/dL — ABNORMAL LOW (ref 6.5–8.1)

## 2023-06-29 LAB — CBC
HCT: 31.6 % — ABNORMAL LOW (ref 36.0–46.0)
Hemoglobin: 10.7 g/dL — ABNORMAL LOW (ref 12.0–15.0)
MCH: 31.9 pg (ref 26.0–34.0)
MCHC: 33.9 g/dL (ref 30.0–36.0)
MCV: 94.3 fL (ref 80.0–100.0)
Platelets: 88 10*3/uL — ABNORMAL LOW (ref 150–400)
RBC: 3.35 MIL/uL — ABNORMAL LOW (ref 3.87–5.11)
RDW: 16.1 % — ABNORMAL HIGH (ref 11.5–15.5)
WBC: 3.2 10*3/uL — ABNORMAL LOW (ref 4.0–10.5)
nRBC: 0 % (ref 0.0–0.2)

## 2023-06-29 LAB — GLUCOSE, CAPILLARY: Glucose-Capillary: 101 mg/dL — ABNORMAL HIGH (ref 70–99)

## 2023-06-29 LAB — CBG MONITORING, ED
Glucose-Capillary: 111 mg/dL — ABNORMAL HIGH (ref 70–99)
Glucose-Capillary: 103 mg/dL — ABNORMAL HIGH (ref 70–99)
Glucose-Capillary: 172 mg/dL — ABNORMAL HIGH (ref 70–99)

## 2023-06-29 LAB — PROTIME-INR
INR: 1.5 — ABNORMAL HIGH (ref 0.8–1.2)
Prothrombin Time: 18.3 s — ABNORMAL HIGH (ref 11.4–15.2)

## 2023-06-29 LAB — LACTIC ACID, PLASMA: Lactic Acid, Venous: 2 mmol/L (ref 0.5–1.9)

## 2023-06-29 LAB — PROCALCITONIN: Procalcitonin: <0.1 ng/mL

## 2023-06-29 LAB — APTT: aPTT: 38 s — ABNORMAL HIGH (ref 24–36)

## 2023-06-29 LAB — STREP PNEUMONIAE URINARY ANTIGEN: Strep Pneumo Urinary Antigen: NEGATIVE

## 2023-06-29 MED ORDER — LACTULOSE 10 GM/15ML PO SOLN
30.0000 g | Freq: Two times a day (BID) | ORAL | Status: DC | PRN
  Administered 2023-06-30: 30 g via ORAL
  Filled 2023-06-29: qty 60

## 2023-06-29 MED ORDER — ONDANSETRON HCL 4 MG/2ML IJ SOLN
4.0000 mg | INTRAMUSCULAR | Status: DC | PRN
  Filled 2023-06-29: qty 2

## 2023-06-29 NOTE — ED Notes (Signed)
 Pt up to use toilet, one person assist pt a little unsteady on feet. Pt able to have BM and return to bed without issue.

## 2023-06-29 NOTE — ED Notes (Signed)
 Pt was ambulatory x1 to the bathroom. Pt walked very slow and shob. Pt agreed to bedside commode until strength comes back from previous fall.

## 2023-06-29 NOTE — ED Notes (Signed)
 Attempted to call Daughter without answer. HIPAA compliant VM left.

## 2023-06-29 NOTE — Consult Note (Signed)
 Consultation Note Date: 06/29/2023 at 1450  Patient Name: Annette Foster  DOB: 1940-12-02  MRN: [id number]  Age / Sex: 83 y.o., female  PCP: Annette Hammers, MD Referring Physician: Joette Mustard, MD  HPI/Patient Profile: 83 y.o. 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 Patient presented to ED 06/28/23 via EMS c/o lethargy and AMS. Of note, patient was recently admitted 4/23-06/18/23 for worsening ascites and underwent IR TIPS procedure 06/09/23.   ED workup found ammonia 46, pancytopenia with WBC 2.8, Hgb 11.1 and plts 91. K+ 2.9 and GFR >60. BP 134/57, HR 83, RR 22, 99% on RA and afebrile.  CT head was negative for acute intracranial abnormalities.  CXR demonstrated multifocal patchy infiltration > on left.   Patient was admitted for management of aspiration pneumonia and acute hepatic encephalopathy.    Palliative consulted to discuss goals of care.   Clinical Assessment and Goals of Care: Extensive chart review completed prior to meeting patient including labs, vital signs, imaging, progress notes, orders, and available advanced directive documents from current and previous encounters. I then met with patient to discuss diagnosis prognosis, GOC, EOL wishes, disposition and options.  I introduced Palliative Medicine as specialized medical care for people living with serious illness. It focuses on providing relief from the symptoms and stress of a serious illness. The goal is to improve quality of life for both the patient and the family.  Elderly, ill-appearing  female resting in bed. She is alert, oriented to self and DOB. She struggles with year, but is able to state correct month, location, situation and current president. During conversation, she frequently pauses for long periods knowing what she wants to say but unable to verbalize.   We discussed a brief life review of the patient. She is currently married but her husband has dementia and resides in facility in [place]. She has 2 daughters. Annette Foster lives nearby and helps care for her mother. She has stayed with Annette Foster and her husband since her last hospitalization. She shares that she has not spoken to her other daughter since the 71's. She worked at [employer] but was unable to recall her role. 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    As far as functional and nutritional status, [name] reports that she performs all of her ADLs independently. Until this admit she dressed, bathed, cooked, cleaned house and drove independently. 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    We discussed patient's current illness and what it means in the larger context of patient's  on-going co-morbidities.  Natural disease trajectory and expectations at EOL were discussed.  I attempted to elicit values and goals of care important to the patient. The most important goal at this time is to return home.   Advance directives, concepts specific to code status, artificial feeding and hydration, and rehospitalization were considered and discussed. Patient states that she understands what I am saying but she wants her daughter here for further conversation.   Education offered regarding concept specific to human mortality and the limitations of medical interventions to prolong life when the body begins to fail to  thrive.  Family is facing treatment option decisions, advanced directive, and anticipatory care needs.    Discussed with patient the importance of continued conversation with family and the medical providers regarding overall plan of care and treatment options, ensuring decisions are within the context of the patient's values and GOCs.    Questions and concerns were addressed. The patient was encouraged to call with questions or concerns.   I called patient daughter, Annette Foster w/o success with goal of planning a family meeting tomorrow. Left a HIPAA complaint VM but have not received return call.   Primary Decision Maker NEXT OF KIN  Physical Exam Vitals reviewed.  Constitutional:      General: She is not in acute distress.    Appearance: She is ill-appearing.  HENT:     Head: Normocephalic and atraumatic.     Mouth/Throat:     Mouth: Mucous membranes are moist.  Pulmonary:     Effort: Pulmonary effort is normal. No respiratory distress.  Skin:    General: Skin is warm and dry.  Neurological:     Mental Status: She is alert. She is disoriented.  Psychiatric:        Mood and Affect: Mood normal.        Behavior: Behavior normal.   Recommendations/Plan: FULL CODE status as previously documented    Continue current supportive interventions Palliative to follow for continued GOC conversations  Palliative Assessment/Data: 70%     Thank you for this consult. Palliative medicine will continue to follow and assist holistically.   Time Total: 75 minutes  Time spent includes: Detailed review of medical records (labs, imaging, vital signs), medically appropriate exam (mental status, respiratory, cardiac, skin), discussed with treatment team, counseling and educating patient, family and staff, documenting clinical information, medication management and coordination of care.     Annette Foster, Annette Foster Palliative Medicine Team  06/29/2023 8:20 AM  Office [phone]  Pager  [phone]     Please contact Palliative Medicine Team providers via AMION for questions and concerns.

## 2023-06-29 NOTE — ED Notes (Signed)
 Pt voicing need to have BM, pt able to use bedpan.

## 2023-06-29 NOTE — Evaluation (Signed)
 Clinical/Bedside Swallow Evaluation Patient Details  Name: Annette Foster MRN: [id number] Date of Birth: 02-14-1940  Today's Date: 06/29/2023 Time: SLP Start Time (ACUTE ONLY): 0948 SLP Stop Time (ACUTE ONLY): 1002 SLP Time Calculation (min) (ACUTE ONLY): 14 min  Past Medical History:  Past Medical History:  Diagnosis Date   Abdominal pain    Allergy    Cancer (HCC) 2017   breast cancer- Left   Cataract    CHF (congestive heart failure) (HCC)    Collagenous colitis    Coronary artery disease    Diabetes mellitus without complication (HCC)    Diarrhea    Diverticulosis    Dysrhythmia    Fatty liver disease, nonalcoholic    Fibrocystic breast    GERD (gastroesophageal reflux disease)    Heart murmur    Hyperlipidemia    Hypertension    Hypothyroidism    IBS (irritable bowel syndrome)    IDA (iron  deficiency anemia)    Liver cirrhosis (HCC)    Personal history of radiation therapy 2017   LEFT BREAST CA   PONV (postoperative nausea and vomiting)    Presence of permanent cardiac pacemaker    Sleep apnea    C-Pap   Past Surgical History:  Past Surgical History:  Procedure Laterality Date   ABDOMINAL HYSTERECTOMY     [place] bso   ABDOMINAL SURGERY     APPENDECTOMY     AV NODE ABLATION N/A 05/17/2021   Procedure: AV NODE ABLATION;  Surgeon: Boyce Byes, MD;  [facility]: [facility];  Service: Cardiovascular;  Laterality: N/A;   BREAST BIOPSY Bilateral 2016   negative   BREAST 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/2015   DCIS, papillary carcinoma in situ   BREAST BIOPSY Left 05/27/2016   BENIGN MAMMARY EPITHELIUM   BREAST BIOPSY Left 11/20/2017   affirm bx x clip BENIGN MAMMARY EPITHELIUM CONSISTENT WITH RAD THERAPY   BREAST EXCISIONAL BIOPSY Right    NEG 1980's   BREAST LUMPECTOMY Left 10/17/2015   DCIS and papillary carcinoma insitu, clear margins   CARDIAC SURGERY     has replacement valve   CATARACT EXTRACTION Right    CATARACT EXTRACTION W/PHACO Left 01/16/2021    Procedure: CATARACT EXTRACTION PHACO AND INTRAOCULAR LENS PLACEMENT (IOC) LEFT DIABETIC 8.46 00:59.8;  Surgeon: Clair Crews, MD;  Location: [facility];  Service: Ophthalmology;  Laterality: Left;   CHOLECYSTECTOMY     COLONOSCOPY WITH PROPOFOL  N/A 03/11/2016   Procedure: COLONOSCOPY WITH PROPOFOL ;  Surgeon: Cassie Click, MD;  Location: [facility] ENDOSCOPY;  Service: Endoscopy;  Laterality: N/A;   COLONOSCOPY WITH PROPOFOL  N/A 02/18/2020   Procedure: COLONOSCOPY WITH PROPOFOL ;  Surgeon: Marnee Sink, MD;  Location: [facility] ENDOSCOPY;  Service: Endoscopy;  Laterality: N/A;   COLONOSCOPY WITH PROPOFOL  N/A 09/10/2022   Procedure: COLONOSCOPY WITH PROPOFOL ;  Surgeon: Luke Salaam, MD;  Location: [facility] ENDOSCOPY;  Service: Gastroenterology;  Laterality: N/A;   ESOPHAGEAL BANDING  09/10/2022   Procedure: ESOPHAGEAL BANDING;  Surgeon: Luke Salaam, MD;  Location: [facility] ENDOSCOPY;  Service: Gastroenterology;;   ESOPHAGEAL BANDING  10/17/2022   Procedure: ESOPHAGEAL BANDING;  Surgeon: Marnee Sink, MD;  Location: [facility] ENDOSCOPY;  Service: Endoscopy;;   ESOPHAGEAL BANDING  12/24/2022   Procedure: ESOPHAGEAL BANDING;  Surgeon: Marnee Sink, MD;  Location: [facility] ENDOSCOPY;  Service: Endoscopy;;   ESOPHAGEAL BANDING  05/13/2023   Procedure: ESOPHAGOSCOPY, WITH ESOPHAGEAL VARICES BAND LIGATION;  Surgeon: Marnee Sink, MD;  Location: [facility] ENDOSCOPY;  Service: Endoscopy;;   ESOPHAGOGASTRODUODENOSCOPY N/A 05/13/2023   Procedure:  EGD (ESOPHAGOGASTRODUODENOSCOPY);  Surgeon: Marnee Sink, MD;  Location: [facility] ENDOSCOPY;  Service: Endoscopy;  Laterality: N/A;   ESOPHAGOGASTRODUODENOSCOPY (EGD) WITH PROPOFOL  N/A 03/11/2016   Procedure: ESOPHAGOGASTRODUODENOSCOPY (EGD) WITH PROPOFOL ;  Surgeon: Cassie Click, MD;  Location: [facility] ENDOSCOPY;  Service: Endoscopy;  Laterality: N/A;   ESOPHAGOGASTRODUODENOSCOPY (EGD) WITH PROPOFOL  N/A 02/18/2020   Procedure: ESOPHAGOGASTRODUODENOSCOPY (EGD) WITH PROPOFOL ;  Surgeon: Marnee Sink, MD;  Location: [facility] ENDOSCOPY;  Service: Endoscopy;  Laterality: N/A;   ESOPHAGOGASTRODUODENOSCOPY (EGD) WITH PROPOFOL  N/A 04/25/2020   Procedure: ESOPHAGOGASTRODUODENOSCOPY (EGD) WITH PROPOFOL ;  Surgeon: Marnee Sink, MD;  Location: [facility] ENDOSCOPY;  Service: Endoscopy;  Laterality: N/A;   ESOPHAGOGASTRODUODENOSCOPY (EGD) WITH PROPOFOL  N/A 05/23/2020   Procedure: ESOPHAGOGASTRODUODENOSCOPY (EGD) WITH PROPOFOL ;  Surgeon: Marnee Sink, MD;  Location: [facility] ENDOSCOPY;  Service: Endoscopy;  Laterality: N/A;   ESOPHAGOGASTRODUODENOSCOPY (EGD) WITH PROPOFOL  N/A 09/10/2022   Procedure: ESOPHAGOGASTRODUODENOSCOPY (EGD) WITH PROPOFOL ;  Surgeon: Luke Salaam, MD;  Location: [facility] ENDOSCOPY;  Service: Gastroenterology;  Laterality: N/A;   ESOPHAGOGASTRODUODENOSCOPY (EGD) WITH PROPOFOL  N/A 10/17/2022   Procedure: ESOPHAGOGASTRODUODENOSCOPY (EGD) WITH PROPOFOL ;  Surgeon: Marnee Sink, MD;  Location: [facility] ENDOSCOPY;  Service: Endoscopy;  Laterality: N/A;   ESOPHAGOGASTRODUODENOSCOPY (EGD) WITH PROPOFOL  N/A 12/24/2022   Procedure: ESOPHAGOGASTRODUODENOSCOPY (EGD) WITH PROPOFOL ;  Surgeon: Marnee Sink, MD;  Location: [facility] ENDOSCOPY;  Service: Endoscopy;  Laterality: N/A;   ESOPHAGOGASTRODUODENOSCOPY (EGD) WITH PROPOFOL  N/A 01/29/2023   Procedure: ESOPHAGOGASTRODUODENOSCOPY (EGD) WITH PROPOFOL ;  Surgeon: Shane Darling, MD;  Location: [facility] ENDOSCOPY;  Service: Endoscopy;  Laterality: N/A;  Ideally we would intubate if possible given concern for varices   EUS N/A 10/04/2021   Procedure: LOWER ENDOSCOPIC ULTRASOUND (EUS);  Surgeon: Rayford Cake, MD;  Location: [facility] ENDOSCOPY;  Service: Gastroenterology;  Laterality: N/A;  [facility]   EYE SURGERY     FINGER ARTHROSCOPY WITH CARPOMETACARPEL ([facility]) ARTHROPLASTY Right 09/03/2018   Procedure: CARPOMETACARPEL [facility]) ARTHROPLASTY RIGHT THUMB;  Surgeon: Molli Angelucci, MD;  Location: [facility] ORS;  Service: Orthopedics;  Laterality: Right;   FLEXIBLE SIGMOIDOSCOPY N/A  09/14/2021   Procedure: FLEXIBLE SIGMOIDOSCOPY;  Surgeon: Marnee Sink, MD;  Location: [facility] ENDOSCOPY;  Service: Endoscopy;  Laterality: N/A;  No anesthesia   GANGLION CYST EXCISION Right 09/03/2018   Procedure: REMOVAL GANGLION OF WRIST;  Surgeon: Molli Angelucci, MD;  Location: [facility] ORS;  Service: Orthopedics;  Laterality: Right;   HARDWARE REMOVAL Right 09/03/2018   Procedure: HARDWARE REMOVAL RIGHT THUMB;  Surgeon: Molli Angelucci, MD;  Location: [facility] ORS;  Service: Orthopedics;  Laterality: Right;  staple removed   HEMOSTASIS CLIP PLACEMENT  09/10/2022   Procedure: HEMOSTASIS CLIP PLACEMENT;  Surgeon: Luke Salaam, MD;  Location: [facility] ENDOSCOPY;  Service: Gastroenterology;;   HEMOSTASIS CONTROL  09/10/2022   Procedure: HEMOSTASIS CONTROL;  Surgeon: Luke Salaam, MD;  Location: [facility] ENDOSCOPY;  Service: Gastroenterology;;   HEMOSTASIS CONTROL  01/29/2023   Procedure: HEMOSTASIS CONTROL;  Surgeon: Shane Darling, MD;  Location: [facility] END[facility]COPY;  Service: Endoscopy;;   IR PARACENTESIS  06/09/2023   IR TIPS  06/09/2023   JOINT REPLACEMENT Left    TKR   left sinusplasty      MASTECTOMY, PARTIAL Left 10/17/2015   Procedure: MASTECTOMY PARTIAL REVISION;  Surgeon: Benancio Bracket, MD;  Location: [facility] ORS;  Service: General;  Laterality: Left;   PACEMAKER IMPLANT N/A 03/23/2021   Procedure: PACEMAKER IMPLANT;  Surgeon: Boyce Byes, MD;  [facility]: [facility];  Service: Cardiovascular;  Laterality: N/A;   PARTIAL MASTECTOMY WITH NEEDLE LOCALIZATION Left 09/29/2015   Procedure:  PARTIAL MASTECTOMY WITH NEEDLE LOCALIZATION;  Surgeon: Benancio Bracket, MD;  Location: [facility] ORS;  Service: General;  Laterality: Left;   POLYPECTOMY  09/10/2022   Procedure: POLYPECTOMY;  Surgeon: Luke Salaam, MD;  Location: [facility] ENDOSCOPY;  Service: Gastroenterology;;   RECTAL EXAM UNDER ANESTHESIA N/A 10/05/2021   Procedure: RECTAL EXAM UNDER ANESTHESIA, ASPIRATION OF RECTAL CYST;  Surgeon: Eldred Grego, MD;  Location: [facility] ORS;  Service: General;  Laterality: N/A;   TOTAL ABDOMINAL HYSTERECTOMY W/ BILATERAL SALPINGOOPHORECTOMY     HPI:  Annette Foster is a 83 y.o. 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 CT Head was negative for acute intracranial abnormality.  DG Chest revealed Multifocal patchy opacities in the left upper lobe and left lower lung, increased in the left lower lung. Findings are concerning for multifocal pneumonia. Follow-up chest x-ray in 4-6 weeks is  recommended to ensure resolution.    Assessment / Plan / Recommendation  Clinical Impression  Pt's duaghter at bedside during this evaluation. Both she and pt report improvement vs. yesterday in alertness and verbal communication. During this evaluation, pt able to communicate basic wants/needs, is able to follow basic directions as well as self feed water and crackers. Pt presents with adequate oropharyngeal  abilities whe consuming thin liquids via straw, puree and crackers. She demonstrated effective mastication and her pharyngeal response appears swift and free of any s/s of aspiration.    At this time, pt appears appropriate for return to regular diet with thin liquids, medicine whole with thin liquids. All questions were answered to pt and daughter's satisfaction. Information discussed with pt's nurse. No further services are indicated at this time.   SLP Visit Diagnosis: Dysphagia, unspecified (R13.10)    Aspiration Risk  No limitations    Diet Recommendation Regular;Thin liquid    Liquid Administration  via: Straw;Cup Medication Administration: Whole meds with liquid Supervision: Patient able to self feed Compensations: Minimize environmental distractions;Slow rate;Small sips/bites Postural Changes: Seated upright at 90 degrees;Remain upright for at least 30 minutes after po intake    Other  Recommendations Oral Care Recommendations: Oral care BID    Recommendations for follow up therapy are one component of a multi-disciplinary discharge planning process, led by the attending physician.  Recommendations may be updated based on patient status, additional functional criteria and insurance authorization.        Functional Status Assessment Patient has not had a recent decline in their functional status   Swallow Study   General Date of Onset: 06/28/23 HPI: Annette Foster is a 83 y.o. 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 CT Head was negative for acute intracranial abnormality.  DG Chest revealed Multifocal patchy opacities in the left upper lobe and left lower lung, increased in the left lower lung. Findings are concerning for multifocal pneumonia. Follow-up chest x-ray in  4-6 weeks is  recommended to ensure resolution. Type of Study: Bedside Swallow Evaluation Previous Swallow Assessment: none in chart Diet Prior to this Study: NPO Temperature Spikes Noted: No Respiratory Status: Room air History of Recent Intubation: No Behavior/Cognition: Alert;Cooperative;Pleasant mood Oral Cavity Assessment: Within Functional Limits Oral Care Completed by SLP: No Oral Cavity - Dentition:  Dentures, top;Dentures, bottom Vision: Functional for self-feeding Self-Feeding Abilities: Able to feed self;Needs assist Patient Positioning: Upright in bed Baseline Vocal Quality: Normal Volitional Cough: Strong Volitional Swallow: Able to elicit    Oral/Motor/Sensory Function Overall Oral Motor/Sensory Function: Within functional limits   Ice Chips Ice chips: Not tested   Thin Liquid Thin Liquid: Within functional limits Presentation: Cup;Straw;Self Fed    Nectar Thick Nectar Thick Liquid: Not tested   Honey Thick Honey Thick Liquid: Not tested   Puree Puree: Within functional limits Presentation: Spoon   Solid     Solid: Within functional limits Presentation: Self Fed     Annette Foster, M.S., CCC-SLP, Tree surgeon Certified Brain Injury Specialist [facility]  [facility] Office [phone] Ascom [phone] Fax [phone]

## 2023-06-29 NOTE — ED Notes (Signed)
 Pt stating need to have a BM and having abd pain. Pt placed on bedpan, was unable to go and pain has subsided.

## 2023-06-29 NOTE — ED Notes (Signed)
 Pt awake and moaning in room, this RN asked pt if in pain, will give one word answers. Pt informed could give tylenol  at this time or could wait another hour for something stronger. Pt willing to wait.

## 2023-06-29 NOTE — Progress Notes (Addendum)
 Progress Note   Patient: Annette Foster VOZ:[id number] DOB: [birth year] DOA: 06/28/2023     1 DOS: the patient was seen and examined on 06/29/2023   Brief hospital course: Per H&P HPI   Annette Foster is a 83 y.o. 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   Patient was recently hospitalized from 4/23 - 5/7 due to worsening ascites. Patient had paracentesis with 4.1 L fluid removed and is s/p TIPS on 4/28.  Per her daughter, patient has been confused since yesterday, difficult to wake up, not following command.  When I saw patient in ED, patient is confused, seems to recognize her daughter, but not orientated to time and the place.  She moves all extremities.  No facial droop.  Per her daughter, patient does not seem to have abdominal pain or chest pain.  No active nausea, vomiting, diarrhea noted.  Her daughter states that patient did have SOB or cough at home, but per ED physician, patient has some cough in ED. Not sure if patient has symptoms of UTI.  Patient fell last weekend, no significant injury.  Patient does not have abdominal distention this time.   Data reviewed independently and ED Course: pt was found to have ammonia 46, pancytopenia with WBC 2.8, hemoglobin 11.1 and platelet 91 (WBC 6.0, hemoglobin 11.1, platelet 114 on 06/16/2023), GFR> 60, potassium 2.9, temperature normal, blood pressure 134/57, heart rate 83, RR 22, oxygen saturation 99% on room air.  CT of head is negative for acute intracranial abnormalities.  Chest x-ray showed multifocal patchy infiltration particularly on the left side.   Patient is admitted to PCU as inpatient.  Assessment and Plan: Possible Aspiration pneumonia  Patient does not have fever and no O2 requirement. Pro calcitonin negative   Continue Unasayn -F/u SLP recommendations  -F/u Bcx, sputum cx   Acute hepatic encephalopathy Likely precipitated by infection v patient reportedly constipated and not having BM. Mentation improved this AM.  Continue lactulose   for BMs  Decompensated Liver cirrhosis c/b esophageal varices s/p banding, portal gastropathy  S/p TIPS, no abd distention but hepatic encephalopathy  Pancytopenia  Chronic. H/o IDA on IV Fe. Leukopenia and thrombocytopenia worse this hospitalization. Likely due to cirrhosis -CTM  -Follows with heme (last saw them 05/2023)     Latest Ref Rng & Units 06/29/2023    2:57 AM 06/28/2023    7:05 PM 06/16/2023    4:18 AM  CBC  WBC 4.0 - 10.5 K/uL 3.2  2.8  6.0   Hemoglobin 12.0 - 15.0 g/dL 42.5  95.6  38.7   Hematocrit 36.0 - 46.0 % 31.6  32.4  32.1   Platelets 150 - 400 K/uL 88  91  114     HFpEF Some lower extremity edema and mildly elevated BNP.  Will resume PO diuretics in the AM.   OSA Had episode of emesis and improved mentation but may still have difficulty removing mask if another emesis event.  Hold for now.   Hypothyroidism, unspecified -Synthroid    HTN (hypertension) -Hydralazine  as needed - Hold home nadolol , torsemide , spironolactone    Hyperlipidemia, unspecified -Hold Zocor  and fenofibrate   Diabetes mellitus without complication (HCC): Recent A1c 5.6,  well-controlled.  Patient is taking Tresiba  6 units daily -Sliding scale insulin  - Glargine insulin  4 units daily      Subjective: Patient is more alert able to give full name, Doesn't know where she is. Having abdominal pain says this has been going since TIPS procedure.   Physical Exam: Vitals:   06/29/23 0430 06/29/23 0500 06/29/23 0530 06/29/23 0730  BP: (!) 111/52 (!) 107/50 (!) 123/57 (!) 132/51  Pulse: 81  82 82 72  Resp: 14 (!) 21 (!) 22 (!) 23  Temp:      TempSrc:      SpO2: 100% 98% 100% 99%  Weight:       Physical Exam  Constitutional:Mild discomfort  Cardiovascular: Normal rate, regular rhythm. 1+ bilatera lower extremity edema 3/4 up shin  Pulmonary: Non labored breathing on room air, no wheezing or rales.  Abdominal: Soft.Non distended. TTP in LUQ and epigastrium. Musculoskeletal: Normal range of motion.     Neurological: Alert and oriented to person. Non focal  Skin: Skin is warm and dry. No jaundice, no asterixix   Data Reviewed:     Latest Ref Rng & Units 06/29/2023    2:57 AM 06/28/2023    7:05 PM 06/16/2023    4:18 AM  CBC  WBC 4.0 - 10.5 K/uL 3.2  2.8  6.0   Hemoglobin 12.0 - 15.0 g/dL 16.1  09.6  04.5   Hematocrit 36.0 - 46.0 % 31.6  32.4  32.1   Platelets 150 - 400 K/uL 88  91  114       Latest Ref Rng & Units 06/29/2023    2:57 AM 06/28/2023    7:05 PM 06/26/2023   10:23 AM  BMP  Glucose 70 - 99 mg/dL 409  811  914   BUN 8 - 23 mg/dL 15  16  13    Creatinine 0.44 - 1.00 mg/dL 7.82  9.56  2.13   BUN/Creat Ratio 12 - 28   17   Sodium 135 - 145 mmol/L 144  142  142   Potassium 3.5 - 5.1 mmol/L 3.7  2.9  3.2   Chloride 98 - 111 mmol/L 112  104  99   CO2 22 - 32 mmol/L 23  28  29    Calcium 8.9 - 10.3 mg/dL 8.7  8.8  9.2      Family Communication: Updated daughter via phone.   Disposition: Status is: Inpatient Remains inpatient appropriate because: further work up and treatment of altered mentation   Planned Discharge Destination: Home    Time spent: 35 minutes  Author: Joette Mustard, MD 06/29/2023 8:59 AM  For on call review [email or web address].

## 2023-06-29 NOTE — ED Notes (Addendum)
 Pt calling out/moaning loudly. Pt stating she had a BM. Pt cleaned of loose stool, new chuck pad and brief placed. Pt given new gown. Pt also stating pain has gone away. Pt becoming more alert, still having a hard time finding words but asked this RN why she is here. This RN explained why pt is in the ED, pt voiced understanding and asked for something to drink.

## 2023-06-30 DIAGNOSIS — Z7189 Other specified counseling: Secondary | ICD-10-CM | POA: Diagnosis not present

## 2023-06-30 DIAGNOSIS — Z515 Encounter for palliative care: Secondary | ICD-10-CM | POA: Diagnosis not present

## 2023-06-30 DIAGNOSIS — J189 Pneumonia, unspecified organism: Secondary | ICD-10-CM | POA: Diagnosis not present

## 2023-06-30 DIAGNOSIS — G9340 Encephalopathy, unspecified: Secondary | ICD-10-CM | POA: Diagnosis not present

## 2023-06-30 DIAGNOSIS — J690 Pneumonitis due to inhalation of food and vomit: Secondary | ICD-10-CM | POA: Diagnosis not present

## 2023-06-30 LAB — COMPREHENSIVE METABOLIC PANEL WITH GFR
ALT: 18 U/L (ref 0–44)
AST: 39 U/L (ref 15–41)
Albumin: 2.5 g/dL — ABNORMAL LOW (ref 3.5–5.0)
Alkaline Phosphatase: 48 U/L (ref 38–126)
Anion gap: 6 (ref 5–15)
BUN: 13 mg/dL (ref 8–23)
CO2: 23 mmol/L (ref 22–32)
Calcium: 8.2 mg/dL — ABNORMAL LOW (ref 8.9–10.3)
Chloride: 111 mmol/L (ref 98–111)
Creatinine, Ser: 0.68 mg/dL (ref 0.44–1.00)
GFR, Estimated: >60 mL/min (ref >60)
Glucose, Bld: 91 mg/dL (ref 70–99)
Potassium: 2.8 mmol/L — ABNORMAL LOW (ref 3.5–5.1)
Sodium: 140 mmol/L (ref 135–145)
Total Bilirubin: 2.5 mg/dL — ABNORMAL HIGH (ref 0.0–1.2)
Total Protein: 5.6 g/dL — ABNORMAL LOW (ref 6.5–8.1)

## 2023-06-30 LAB — GLUCOSE, CAPILLARY
Glucose-Capillary: 129 mg/dL — ABNORMAL HIGH (ref 70–99)
Glucose-Capillary: 135 mg/dL — ABNORMAL HIGH (ref 70–99)
Glucose-Capillary: 112 mg/dL — ABNORMAL HIGH (ref 70–99)
Glucose-Capillary: 89 mg/dL (ref 70–99)

## 2023-06-30 LAB — MAGNESIUM: Magnesium: 2 mg/dL (ref 1.7–2.4)

## 2023-06-30 LAB — PHOSPHORUS: Phosphorus: 2.7 mg/dL (ref 2.5–4.6)

## 2023-06-30 MED ORDER — POTASSIUM CHLORIDE 10 MEQ/100ML IV SOLN
10.0000 meq | INTRAVENOUS | Status: AC
Start: 2023-06-30 — End: 2023-06-30
  Administered 2023-06-30 (×4): 10 meq via INTRAVENOUS
  Filled 2023-06-30 (×4): qty 100

## 2023-06-30 MED ORDER — LEVOTHYROXINE SODIUM 75 MCG PO TABS
75.0000 ug | ORAL_TABLET | Freq: Every day | ORAL | Status: DC
  Administered 2023-07-01: 75 ug via ORAL
  Filled 2023-06-30: qty 1
  Filled 2023-06-30: qty 3

## 2023-06-30 MED ORDER — POTASSIUM CHLORIDE CRYS ER 20 MEQ PO TBCR
40.0000 meq | EXTENDED_RELEASE_TABLET | ORAL | Status: AC
  Administered 2023-06-30 (×2): 40 meq via ORAL
  Filled 2023-06-30 (×2): qty 2

## 2023-06-30 MED ORDER — MELATONIN 5 MG PO TABS
2.5000 mg | ORAL_TABLET | Freq: Every day | ORAL | Status: DC
  Administered 2023-06-30 (×2): 2.5 mg via ORAL
  Filled 2023-06-30 (×2): qty 1

## 2023-06-30 MED ORDER — PANTOPRAZOLE SODIUM 40 MG PO TBEC
40.0000 mg | DELAYED_RELEASE_TABLET | Freq: Every day | ORAL | Status: DC
  Administered 2023-06-30 – 2023-07-01 (×2): 40 mg via ORAL
  Filled 2023-06-30 (×2): qty 1

## 2023-06-30 MED ORDER — LACTULOSE 10 GM/15ML PO SOLN
20.0000 g | Freq: Two times a day (BID) | ORAL | Status: DC
  Administered 2023-06-30 – 2023-07-01 (×2): 20 g via ORAL
  Filled 2023-06-30 (×2): qty 30

## 2023-06-30 MED ORDER — NADOLOL 20 MG PO TABS
20.0000 mg | ORAL_TABLET | Freq: Every day | ORAL | Status: DC
  Administered 2023-06-30: 20 mg via ORAL
  Filled 2023-06-30 (×2): qty 1

## 2023-06-30 NOTE — Plan of Care (Signed)
  Problem: Fluid Volume: Goal: Ability to maintain a balanced intake and output will improve Outcome: Progressing   Problem: Metabolic: Goal: Ability to maintain appropriate glucose levels will improve Outcome: Progressing   Problem: Nutritional: Goal: Maintenance of adequate nutrition will improve Outcome: Progressing   Problem: Skin Integrity: Goal: Risk for impaired skin integrity will decrease Outcome: Progressing   Problem: Tissue Perfusion: Goal: Adequacy of tissue perfusion will improve Outcome: Progressing   Problem: Clinical Measurements: Goal: Ability to maintain clinical measurements within normal limits will improve Outcome: Progressing Goal: Diagnostic test results will improve Outcome: Progressing Goal: Respiratory complications will improve Outcome: Progressing Goal: Cardiovascular complication will be avoided Outcome: Progressing   Problem: Activity: Goal: Risk for activity intolerance will decrease Outcome: Progressing   Problem: Nutrition: Goal: Adequate nutrition will be maintained Outcome: Progressing   Problem: Elimination: Goal: Will not experience complications related to bowel motility Outcome: Progressing Goal: Will not experience complications related to urinary retention Outcome: Progressing   Problem: Pain Managment: Goal: General experience of comfort will improve and/or be controlled Outcome: Progressing   Problem: Safety: Goal: Ability to remain free from injury will improve Outcome: Progressing   Problem: Skin Integrity: Goal: Risk for impaired skin integrity will decrease Outcome: Progressing   Problem: Activity: Goal: Ability to tolerate increased activity will improve Outcome: Progressing   Problem: Clinical Measurements: Goal: Ability to maintain a body temperature in the normal range will improve Outcome: Progressing   Problem: Respiratory: Goal: Ability to maintain adequate ventilation will improve Outcome:  Progressing Goal: Ability to maintain a clear airway will improve Outcome: Progressing

## 2023-06-30 NOTE — Plan of Care (Signed)
  Problem: Education: Goal: Ability to describe self-care measures that may prevent or decrease complications (Diabetes Survival Skills Education) will improve Outcome: Not Progressing Goal: Individualized Educational Video(s) Outcome: Not Progressing   Problem: Coping: Goal: Ability to adjust to condition or change in health will improve Outcome: Not Progressing   Problem: Fluid Volume: Goal: Ability to maintain a balanced intake and output will improve Outcome: Progressing   Problem: Health Behavior/Discharge Planning: Goal: Ability to identify and utilize available resources and services will improve Outcome: Not Progressing Goal: Ability to manage health-related needs will improve Outcome: Progressing   Problem: Metabolic: Goal: Ability to maintain appropriate glucose levels will improve Outcome: Progressing   Problem: Nutritional: Goal: Maintenance of adequate nutrition will improve Outcome: Progressing Goal: Progress toward achieving an optimal weight will improve Outcome: Progressing   Problem: Skin Integrity: Goal: Risk for impaired skin integrity will decrease Outcome: Progressing   Problem: Tissue Perfusion: Goal: Adequacy of tissue perfusion will improve Outcome: Progressing   Problem: Education: Goal: Knowledge of General Education information will improve Description: Including pain rating scale, medication(s)/side effects and non-pharmacologic comfort measures Outcome: Progressing   Problem: Health Behavior/Discharge Planning: Goal: Ability to manage health-related needs will improve Outcome: Progressing   Problem: Clinical Measurements: Goal: Ability to maintain clinical measurements within normal limits will improve Outcome: Progressing Goal: Will remain free from infection Outcome: Progressing Goal: Diagnostic test results will improve Outcome: Progressing Goal: Respiratory complications will improve Outcome: Progressing Goal: Cardiovascular  complication will be avoided Outcome: Progressing   Problem: Activity: Goal: Risk for activity intolerance will decrease Outcome: Progressing   Problem: Nutrition: Goal: Adequate nutrition will be maintained Outcome: Progressing   Problem: Elimination: Goal: Will not experience complications related to bowel motility Outcome: Progressing Goal: Will not experience complications related to urinary retention Outcome: Progressing   Problem: Pain Managment: Goal: General experience of comfort will improve and/or be controlled Outcome: Progressing   Problem: Safety: Goal: Ability to remain free from injury will improve Outcome: Progressing   Problem: Skin Integrity: Goal: Risk for impaired skin integrity will decrease Outcome: Progressing   Problem: Activity: Goal: Ability to tolerate increased activity will improve Outcome: Progressing   Problem: Clinical Measurements: Goal: Ability to maintain a body temperature in the normal range will improve Outcome: Progressing   Problem: Respiratory: Goal: Ability to maintain adequate ventilation will improve Outcome: Progressing Goal: Ability to maintain a clear airway will improve Outcome: Progressing

## 2023-06-30 NOTE — Progress Notes (Signed)
 Patient is alert and oriented times 1 or 2. She has a difficult time finding the words she needs to express herself and becomes frustrated. She becomes tearful when she talks about herself and her family, particularly her husband. She was admitted for aspiration pneumonia and has no history of dementia. The change could be related to her elevated ammonia levels.

## 2023-06-30 NOTE — Progress Notes (Signed)
 Occupational Therapy Evaluation Patient Details Name: Annette Foster MRN: [id number] DOB: 1940/12/05 Today's Date: 06/30/2023   History of Present Illness   Pt is an 83 yo female admitted for recurrent ascites. Pt recently underwent TIPs procedure and paracentesis. PMH of afib, HTN, CHF, pacemaker, DM, breast cancer, GERD, hypothyroidism, OSA on CPAP, nonalcoholic liver cirrhosis, with pancytopenia, portal hypertensive gastropathy, grade 2 esophageal varices s/p banding 05/13/2023.     Clinical Impressions Annette Foster was seen for OT evaluation this date. Prior to hospital admission, pt was MOD I for ADLs. Pt lives with daughter. Pt presents to acute OT demonstrating impaired ADL performance and functional mobility 2/2 decreased strength and activity tolerance (See OT problem list for additional functional deficits). Pt currently requires MAX A for pericare, CGA for toilet t/f and hand washing.  Orientation was difficult to assess as pt demonstrated word finding difficulties and deferred to her daughter to answer questions. Gait belt provided for safety at home. Pt would benefit from skilled OT services to address noted impairments and functional limitations (see below for any additional details) in order to maximize safety and independence while minimizing falls risk and caregiver burden. Anticipate the need for follow up OT services upon acute hospital DC.      If plan is discharge home, recommend the following:   A little help with walking and/or transfers;A little help with bathing/dressing/bathroom;Assistance with cooking/housework;Assist for transportation     Functional Status Assessment   Patient has had a recent decline in their functional status and demonstrates the ability to make significant improvements in function in a reasonable and predictable amount of time.     Equipment Recommendations   BSC/3in1     Recommendations for Other Services          Precautions/Restrictions   Precautions Precautions: Fall Restrictions Weight Bearing Restrictions Per Provider Order: No     Mobility Bed Mobility               General bed mobility comments: Not tested    Transfers Overall transfer level: Needs assistance Equipment used: Rolling walker (2 wheels) Transfers: Sit to/from Stand Sit to Stand: Contact guard assist     Step pivot transfers: Contact guard assist            Balance Overall balance assessment: Needs assistance Sitting-balance support: Feet supported Sitting balance-Leahy Scale: Normal                                     ADL either performed or assessed with clinical judgement   ADL Overall ADL's : Needs assistance/impaired                                       General ADL Comments: MAX A for pericare, CGA for toilet t/f and hand washing     Vision         Perception         Praxis         Pertinent Vitals/Pain Pain Assessment Pain Assessment: No/denies pain     Extremity/Trunk Assessment Upper Extremity Assessment Upper Extremity Assessment: Generalized weakness   Lower Extremity Assessment Lower Extremity Assessment: Generalized weakness       Communication Communication Communication: Impaired Factors Affecting Communication: Difficulty expressing self   Cognition Arousal: Alert Behavior During Therapy: [facility] for tasks  assessed/performed Cognition: Difficult to assess Difficult to assess due to: Impaired communication                             Following commands: Intact       Cueing  General Comments   Cueing Techniques: Verbal cues      Exercises     Shoulder Instructions      Home Living Family/patient expects to be discharged to:: Private residence Living Arrangements: Children Available Help at Discharge: Family;Available 24 hours/day Type of Home: House Home Access: Stairs to enter [employer] of Steps: 1 Entrance Stairs-Rails: Left Home Layout: One level               Home Equipment: Agricultural consultant (2 wheels);BSC/3in1          Prior Functioning/Environment Prior Level of Function : Independent/Modified Independent             Mobility Comments: MOD I + RW ADLs Comments: daughter assists with ADLs    OT Problem List: Decreased strength;Decreased activity tolerance   OT Treatment/Interventions: Self-care/ADL training      OT Goals(Current goals can be found in the care plan section)   Acute Rehab OT Goals Patient Stated Goal: to go home OT Goal Formulation: With patient/family Time For Goal Achievement: 07/14/23 Potential to Achieve Goals: Good ADL Goals Pt Will Perform Lower Body Dressing: with supervision;sit to/from stand Pt Will Transfer to Toilet: with modified independence;ambulating Pt Will Perform Toileting - Clothing Manipulation and hygiene: with modified independence;sitting/lateral leans   OT Frequency:  Min 2X/week    Co-evaluation              AM-PAC OT "6 Clicks" Daily Activity     Outcome Measure Help from another person eating meals?: None Help from another person taking care of personal grooming?: None Help from another person toileting, which includes using toliet, bedpan, or urinal?: A Little Help from another person bathing (including washing, rinsing, drying)?: A Little Help from another person to put on and taking off regular upper body clothing?: None Help from another person to put on and taking off regular lower body clothing?: A Little 6 Click Score: 21   End of Session Equipment Utilized During Treatment: Gait belt;Rolling walker (2 wheels)  Activity Tolerance: Patient tolerated treatment well Patient left: in chair;with chair alarm set;with family/visitor present  OT Visit Diagnosis: Other abnormalities of gait and mobility (R26.89);Muscle weakness (generalized) (M62.81)                Time:  1431-1450 OT Time Calculation (min): 19 min Charges:  OT General Charges $OT Visit: 1 Visit OT Evaluation $OT Eval Low Complexity: 1 Low OT Treatments $Self Care/Home Management : 8-22 mins  Annette Foster, Student OT   [employer] 06/30/2023, 3:31 PM

## 2023-06-30 NOTE — Evaluation (Signed)
 Physical Therapy Evaluation Patient Details Name: Annette Foster MRN: [id number] DOB: October 24, 1940 Today's Date: 06/30/2023  History of Present Illness  83 y.o. 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 Patient presented to ED 06/28/23 via EMS c/o lethargy and AMS.   Clinical Impression  Patient alert, agreeable to PT, family and visitors at bedside. Pt did exhibit difficulty with word finding, but oriented to self, place, family, situation. Per pt, she has been staying with her daughter, and when she fell she was outside and not using her RW. Family will be available to provide assistance as needed at discharge.  She was able to perform supine to sit with HOB elevated and use of bed rails, extra time, supervision. Sit <> Stand from EOB with CGA and RW, verbal cues for safe hand placement. She ambulated ~82ft in room, CGA/RW. Noted for decreased gait velocity and some fatigue but no LOB. Pt on commode upon OT entrance, PT left pt with OT for their assessment.  Overall the patient demonstrated deficits (see "PT Problem List") that impede the patient's functional abilities, safety, and mobility and would benefit from skilled PT intervention.          If plan is discharge home, recommend the following: A little help with walking and/or transfers;A little help with bathing/dressing/bathroom;Assistance with cooking/housework;Assist for transportation;Help with stairs or ramp for entrance   Can travel by private vehicle        Equipment Recommendations None recommended by PT  Recommendations for Other Services       Functional Status Assessment Patient has  had a recent decline in their functional status and demonstrates the ability to make significant improvements in function in a reasonable and predictable amount of time.     Precautions / Restrictions Precautions Precautions: Fall Recall of Precautions/Restrictions: Intact Restrictions Weight Bearing Restrictions Per Provider Order: No      Mobility  Bed Mobility Overal bed mobility: Needs Assistance Bed Mobility: Supine to Sit     Supine to sit: Supervision, Used rails     General bed mobility comments: verbal cues for technique, extra time    Transfers Overall transfer level: Needs assistance Equipment used: Rolling walker (2 wheels) Transfers: Sit to/from Stand Sit to Stand: Contact guard assist                Ambulation/Gait Ambulation/Gait assistance: Contact guard assist Gait Distance (Feet):  (23ft to bathroom, OT in room at end of session) Assistive device: Rolling walker (2 wheels)   Gait velocity: decreased        Stairs            Wheelchair Mobility     Tilt Bed    Modified Rankin (Stroke Patients Only)       Balance Overall balance assessment: Needs assistance Sitting-balance support: Feet supported Sitting balance-Leahy Scale: Normal     Standing balance support: No upper extremity supported, During functional activity Standing balance-Leahy Scale: Fair                               Pertinent Vitals/Pain Pain Assessment Pain Assessment: No/denies pain    Home Living Family/patient expects to be discharged  to:: Private residence Living Arrangements: Children Available Help at Discharge: Family;Available 24 hours/day Type of Home: House Home Access: Stairs to enter Entrance Stairs-Rails: Left Entrance Stairs-Number of Steps: 2   Home Layout: One level Home Equipment: Agricultural consultant (2 wheels);BSC/3in1      Prior Function Prior Level of Function : Independent/Modified Independent              Mobility Comments: MOD I + RW ADLs Comments: daughter assists with ADLs     Extremity/Trunk Assessment   Upper Extremity Assessment Upper Extremity Assessment: Generalized weakness    Lower Extremity Assessment Lower Extremity Assessment: Generalized weakness       Communication   Communication Communication: Impaired Factors Affecting Communication: Difficulty expressing self    Cognition Arousal: Alert Behavior During Therapy: WFL for tasks assessed/performed   PT - Cognitive impairments: No apparent impairments                         Following commands: Intact       Cueing Cueing Techniques: Verbal cues     General Comments      Exercises     Assessment/Plan    PT Assessment Patient needs continued PT services  PT Problem List Decreased strength;Decreased range of motion;Decreased activity tolerance;Decreased balance;Decreased mobility;Decreased knowledge of precautions       PT Treatment Interventions DME instruction;Balance training;Gait training;Neuromuscular re-education;Stair training;Functional mobility training;Patient/family education;Therapeutic activities;Therapeutic exercise    PT Goals (Current goals can be found in the Care Plan section)  Acute Rehab PT Goals Patient Stated Goal: to go home with her daughter PT Goal Formulation: With patient Time For Goal Achievement: 07/14/23 Potential to Achieve Goals: Good    Frequency Min 2X/week     Co-evaluation               AM-PAC PT "6 Clicks" Mobility  Outcome Measure Help needed turning from your back to your side while in a flat bed without using bedrails?: None Help needed moving from lying on your back to sitting on the side of a flat bed without using bedrails?: None Help needed moving to and from a bed to a chair (including a wheelchair)?: A Little Help needed standing up from a chair using your arms (e.g., wheelchair or bedside chair)?: A Little Help needed to walk  in hospital room?: A Little Help needed climbing 3-5 steps with a railing? : A Little 6 Click Score: 20    End of Session Equipment Utilized During Treatment: Gait belt Activity Tolerance: Patient tolerated treatment well Patient left:  (on commode in bathroom with OT) Nurse Communication: Mobility status PT Visit Diagnosis: Other abnormalities of gait and mobility (R26.89);Difficulty in walking, not elsewhere classified (R26.2);Muscle weakness (generalized) (M62.81)    Time: 9604-5409 PT Time Calculation (min) (ACUTE ONLY): 13 min   Charges:     PT Treatments $Therapeutic Activity: 8-22 mins PT General Charges $$ ACUTE PT VISIT: 1 Visit         Darien Eden PT, DPT 3:52 PM,06/30/23

## 2023-06-30 NOTE — TOC Initial Note (Signed)
 Transition of Care [facility]) - Initial/Assessment Note    Patient Details  Name: Annette Foster MRN: [id number] Date of Birth: [birth year]  Transition of Care [facility]) CM/SW Contact:    Elsie Halo, RN Phone Number: 06/30/2023, 2:40 PM  Clinical Narrative:                  Patient is from home with her daughter and son in law. She drives and has no difficulty getting to appointments or obtaining meds. Her PCP is DR. Arabella Knife, and [facility] [place]. She has a walker and cane at home. Her daughter will drive her home from the hospital.  Patient is active with [facility].   Expected Discharge Plan: Home w Home Health Services Barriers to Discharge: Continued Medical Work up   Patient Goals and CMS Choice            Expected Discharge Plan and Services       Living arrangements for the past 2 months: Single Family Home                                      Prior Living Arrangements/Services Living arrangements for the past 2 months: Single Family Home Lives with:: Adult Children                   Activities of Daily Living   ADL Screening (condition at time of admission) Independently performs ADLs?: Yes (appropriate for developmental age) Does the patient have a NEW difficulty with bathing/dressing/toileting/self-feeding that is expected to last >3 days?: Yes (Initiates electronic notice to provider for possible OT consult) Does the patient have a NEW difficulty with getting in/out of bed, walking, or climbing stairs that is expected to last >3 days?: Yes (Initiates electronic notice to provider for possible PT consult) Does the patient have a NEW difficulty with communication that is expected to last >3 days?: No Is the patient deaf or have difficulty hearing?: No Does the patient have difficulty seeing, even when wearing glasses/contacts?: No Does the patient have difficulty concentrating, remembering, or making decisions?: Yes  Permission  Sought/Granted                  Emotional Assessment       Orientation: : Oriented to Self      Admission diagnosis:  Aspiration pneumonia (HCC) [J69.0] Acute encephalopathy [G93.40] HCAP (healthcare-associated pneumonia) [J18.9] Chronic liver disease and cirrhosis (HCC) [K74.60, K76.9] Multifocal pneumonia [J18.9] Patient Active Problem List   Diagnosis Date Noted   Aspiration pneumonia (HCC) 06/28/2023   Chronic diastolic CHF (congestive heart failure) (HCC) 06/28/2023   Acute hepatic encephalopathy (HCC) 06/28/2023   Liver cirrhosis secondary to NASH (HCC) 06/05/2023   Hypotension 06/05/2023   Refractory ascites 05/30/2023   Hyperlipidemia, unspecified 05/30/2023   Diabetes mellitus without complication (HCC) 05/30/2023   Overweight (BMI 25.0-29.9) 05/30/2023   Anxiety 05/30/2023   Decompensated nonalcoholic liver cirrhosis (HCC) 05/21/2023   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 05/21/2023   OSA on CPAP 05/21/2023   IDA (iron  deficiency anemia) 05/21/2023   Abdominal pain 03/03/2023   Electrolyte abnormality 03/03/2023   ABLA (acute blood loss anemia) 03/02/2023   Non-alcoholic fatty liver disease 01/30/2023   Sepsis due to pneumonia (HCC) 01/29/2023   GERD without esophagitis 01/29/2023   Dyslipidemia 01/29/2023   Type 2 diabetes mellitus with complication, with long-term current use  of insulin  (HCC) 01/29/2023   Hypokalemia 01/29/2023   Hyponatremia 01/29/2023   GI bleeding 01/28/2023   H/O atrioventricular nodal ablation 11/13/2022   Esophageal varices without bleeding (HCC) 10/17/2022   Pneumonia 09/11/2022   Adenomatous polyp of colon 09/10/2022   Rectal bleeding 09/08/2022   Leg pain 07/18/2022   Varicose veins with inflammation 07/17/2022   Unspecified injury of head, initial encounter 09/14/2021   Rectal lesion    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) 08/22/2021   Persistent atrial fibrillation (HCC) 05/17/2021    S/P placement of cardiac pacemaker 03/23/2021   Portal hypertension (HCC)    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 bleeding (HCC)    Chronic heart failure with preserved ejection fraction (HFpEF) (HCC) 04/05/2020   Hepatic cirrhosis (HCC) 04/05/2020   Abnormal EKG 04/05/2020   Allergic rhinitis due to pollen 02/24/2020   Chronic allergic conjunctivitis 02/24/2020   Allergic rhinitis due to animal (cat) (dog) hair and dander 02/24/2020   Allergic rhinitis 02/24/2020   Polyp of transverse colon    Hematochezia 02/17/2020   Chronic anticoagulation 02/17/2020   Anasarca 02/17/2020   Symptomatic sinus bradycardia 02/17/2020   Paroxysmal atrial fibrillation (HCC) 02/17/2020   AKI (acute kidney injury) (HCC) 02/17/2020   History of colitis 02/17/2020   Acute GI bleeding    Atrial fibrillation with RVR (HCC) 02/07/2020   NASH (nonalcoholic steatohepatitis)    Hav (hallux abducto valgus), unspecified laterality 06/03/2019   Acquired bilateral hammer toes 06/03/2019   Iron  deficiency 04/26/2019   Right-sided chest wall pain 04/12/2019   Mid back pain on right side 04/12/2019   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 11/03/2017   Recurrent UTI (urinary tract infection) 07/23/2017   Nausea vomiting and diarrhea 01/11/2017   Hypothyroidism, unspecified 01/11/2017   HTN (hypertension) 01/11/2017   GERD (gastroesophageal reflux disease) 01/11/2017   Pancytopenia (HCC) 11/14/2016   Monilial vulvitis 11/06/2016   Spongiotic psoriasiform dermatitis 10/08/2016   Status post hysterectomy 06/20/2016   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 [place] [address] Phone: [phone] Fax: [phone]     Social Drivers of Health (SDOH) Social History: SDOH Screenings   Food  Insecurity: No Food Insecurity (06/29/2023)  Housing: Low Risk  (06/29/2023)  Transportation Needs: No Transportation Needs (06/29/2023)  Utilities: Not At Risk (06/29/2023)  Financial Resource Strain: Medium Risk (05/02/2023)   Received from [facility]  Physical Activity: Unknown (01/30/2018)   Received from [facility]  Social Connections: Moderately Isolated (06/29/2023)  Tobacco Use: Low Risk  (06/29/2023)   SDOH Interventions:     Readmission Risk Interventions     No data to display

## 2023-06-30 NOTE — Progress Notes (Addendum)
 Palliative Care Progress Note, Assessment & Plan   Patient Name: Annette Foster       Date: 06/30/2023 DOB: Apr 30, 1940  Age: 83 y.o. MRN#: [id number] Attending Physician: Althia Atlas, MD Primary Care Physician: Westley Hammers, MD Admit Date: 06/28/2023  Subjective: Pt denies pain. Glad to walk with PT. Frustrated with difficulty verbalizing what she wants to say.   HPI: 83 y.o. 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 Patient presented to ED 06/28/23 via EMS c/o lethargy and AMS. Of note, patient was recently admitted 4/23-06/18/23 for worsening ascites and underwent IR TIPS procedure 06/09/23.    ED workup found ammonia 46, pancytopenia with WBC 2.8, Hgb 11.1 and plts 91. K+ 2.9 and GFR >60. BP 134/57, HR 83, RR 22, 99% on RA and afebrile.  CT head was negative for acute intracranial abnormalities.  CXR demonstrated multifocal patchy infiltration > on left.    Patient was admitted for management of aspiration pneumonia and acute hepatic encephalopathy.     Palliative consulted to discuss goals of care.  Summary of counseling/coordination of care: Extensive chart review completed prior to meeting patient including labs, vital signs, imaging, progress notes, orders, and available advanced directive documents from current and previous encounters.   After reviewing the patient's chart and assessing the patient at bedside, I spoke with patient in regards to symptom  management and goals of care.    83 y.o. 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 Patient presented to ED 06/28/23 via EMS c/o lethargy and AMS. Of note, patient was recently admitted 4/23-06/18/23 for worsening ascites and underwent IR TIPS procedure 06/09/23.    ED workup found ammonia 46, pancytopenia with WBC 2.8, Hgb 11.1 and plts 91. K+ 2.9 and GFR >60. BP 134/57, HR 83, RR 22, 99% on RA and afebrile.  CT head was negative for acute intracranial abnormalities.  CXR demonstrated multifocal patchy infiltration > on left.    Patient was admitted for management of aspiration pneumonia and acute hepatic encephalopathy.     Palliative consulted to discuss goals of care.  Ill-appearing, elderly female noted to be ambulating with PT with the assistance of walker. She is alert to self, DOB, time, location and situation. She is tearful and struggles with words during conversation. Even, unlabored respirations. She is in no distress.   Met with patient and daughter, Annette Foster, at bedside. Discussed the nature of patient's liver disease and future complications would most likely include confusion with hospitalizations. Educated both on the importance of completing HPOA documentation and considering code status that align with her goals of care. Both agree that these are topics need to be discussed and  finalized due to chronic, progressive disease and anticipation of future complications.   Recommended a day or two for further recovery before making decisions. Annette Foster agrees that even though her mother's confusion has much improved, it is better to wait to she is completely  clear on code status and goals of care before making any decisions.   Therapeutic silence and active listening provided for patient and daughter to share their thoughts and emotions regarding current medical situation.  Emotional support provided.  Physical Exam Vitals reviewed.  Constitutional:      General: She is not in acute distress.    Appearance: She is ill-appearing.  HENT:     Head: Normocephalic and atraumatic.     Mouth/Throat:     Mouth: Mucous membranes are moist.  Pulmonary:     Effort: Pulmonary effort is normal. No respiratory distress.  Musculoskeletal:     Right lower leg: No edema.     Left lower leg: No edema.  Skin:    General: Skin is warm and dry.  Neurological:     Mental Status: She is alert and oriented to person, place, and time.  Psychiatric:        Mood and Affect: Mood normal.        Behavior: Behavior normal.   Recommendations/Plan: FULL CODE status as previously documented    Continue current supportive interventions Spiritual consult placed to complete HPOA documentation  Palliative to follow for continued GOC conversations          Total Time 50 minutes   Time spent includes: Detailed review of medical records (labs, imaging, vital signs), medically appropriate exam (mental status, respiratory, cardiac, skin), discussed with treatment team, counseling and educating patient, family and staff, documenting clinical information, medication management and coordination of care.     Ina Manas, Joyice Nodal- [facility] Palliative Medicine Team  06/30/2023 3:14 PM  Office [phone]  Pager [phone]

## 2023-06-30 NOTE — Progress Notes (Signed)
 [facility] Progress Note  Patient: Annette Foster    BJY:[id number]  DOA: 06/28/2023     Date of Service: the patient was seen and examined on 06/30/2023  Chief Complaint  Patient presents with   Altered Mental Status   Brief hospital course: Annette Foster is a 83 y.o. 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   Patient was recently hospitalized from 4/23 - 5/7 due to worsening ascites. Patient had paracentesis with 4.1 L fluid removed and is s/p TIPS on 4/28.  Per her daughter, patient has been confused since yesterday, difficult to wake up, not following command.  When I saw patient in ED, patient is confused, seems to recognize her daughter, but not orientated to time and the place.  She moves all extremities.  No facial droop.  Per her daughter, patient does not seem to have abdominal pain or chest pain.  No active nausea, vomiting, diarrhea noted.  Her daughter states that patient did have SOB or cough at home, but per ED physician, patient has some cough in ED. Not sure if patient has symptoms of UTI.  Patient fell last weekend, no significant injury.  Patient does not have abdominal distention this time.   Data reviewed independently and ED Course: pt was found to have ammonia 46, pancytopenia with WBC 2.8, hemoglobin 11.1 and platelet 91 (WBC 6.0, hemoglobin 11.1, platelet 114 on 06/16/2023), GFR> 60, potassium 2.9, temperature normal, blood pressure 134/57, heart rate 83, RR 22, oxygen saturation 99% on room air.  CT of head is negative for acute intracranial abnormalities.  Chest x-ray  showed multifocal patchy infiltration particularly on the left side.  Patient is admitted to PCU as inpatient.  Assessment and Plan:  # Possible Aspiration pneumonia  Patient does not have fever and no O2 requirement. Pro calcitonin negative   Continue Unasayn SLP eval done, recommended regular diet with thin liquids Bcx NGTD, sputum cx pending     # Acute hepatic encephalopathy Likely precipitated by infection v patient reportedly constipated and not having BM. Mentation improving  Continue lactulose   for BMs   Decompensated Liver cirrhosis c/b esophageal varices s/p banding, portal gastropathy  S/p TIPS, no abd distention but hepatic encephalopathy   Pancytopenia  Chronic. H/o IDA on IV Fe. Leukopenia and thrombocytopenia worse this hospitalization. Likely due to cirrhosis -CTM  -Follows with heme (last saw them 05/2023)  # HFpEF Some lower extremity edema and mildly elevated BNP.  resume PO diuretics when needed   # Hypokalemia secondary to diuresis Potassium repleted. Monitor electrolytes and replete as needed.  # OSA Had episode of emesis and improved mentation but may still have difficulty removing mask if another emesis event.  Hold for now.    # Hypothyroidism, unspecified: Synthroid    # HTN (hypertension) -Hydralazine  as needed - Resumed home nadolol ,  Hold Torsemide , and spironolactone  for now    Hyperlipidemia, unspecified -Hold Zocor  and fenofibrate    Diabetes mellitus without complication (HCC): Recent A1c 5.6, well-controlled.  Patient is taking Tresiba  6 units daily -Sliding scale insulin  - Glargine insulin  4 units daily   Body mass index is 25.31 kg/m.  Interventions:  Diet: Regular diet DVT  Prophylaxis: SCD, pharmacological prophylaxis contraindicated due to low platelet count   Advance goals of care discussion: Full code  Family Communication: family was not present at bedside, at the time of interview.  The pt provided permission to  discuss medical plan with the family. Opportunity was given to ask question and all questions were answered satisfactorily.   Disposition:  Pt is from Home, admitted with AMS, aspiration PNA, developed low K, still on IV Abx and electrolyte imbalance, which precludes a safe discharge. Discharge to home, when stable, most likely in 1 to 2 days.  Subjective: No significant events overnight, patient is still slightly confused    Physical Exam: General: NAD, lying comfortably, Mild SOB Appear in no distress, affect appropriate Eyes: PERRLA ENT: Oral Mucosa Clear, moist  Neck: no JVD,  Cardiovascular: S1 and S2 Present, audible murmur,  Respiratory: good respiratory effort, Bilateral Air entry equal and Decreased, no Crackles, no wheezes Abdomen: Bowel Sound present, Soft and no tenderness,  Skin: no rashes Extremities: no Pedal edema, no calf tenderness Neurologic: without any new focal findings, confused knows her name, month and knows that she is in the hospital. Does not know her DOB, current year and exact location, why she is here in the hospital   Gait not checked due to patient safety concerns  Vitals:   06/29/23 2320 06/30/23 0441 06/30/23 0801 06/30/23 1218  BP:  (!) 120/46 (!) 131/56 (!) 131/53  Pulse:  69 76 75  Resp:  18 18 18   Temp:  98.6 F (37 C) 98.3 F (36.8 C) 98.5 F (36.9 C)  TempSrc:  Oral  Oral  SpO2:  100% 100% 99%  Weight: 64.8 kg     Height: 5\' 3"  (1.6 m)       Intake/Output Summary (Last 24 hours) at 06/30/2023 1236 Last data filed at 06/30/2023 0405 Gross per 24 hour  Intake 774.84 ml  Output --  Net 774.84 ml   Filed Weights   06/28/23 1900 06/29/23 2320  Weight: 64.6 kg 64.8 kg    Data Reviewed: I have personally reviewed and interpreted daily labs, tele strips, imagings as discussed above. I reviewed all nursing notes, pharmacy notes, vitals, pertinent old records I have discussed plan of care as described above with RN and  patient/family.  CBC: Recent Labs  Lab 06/28/23 1905 06/29/23 0257  WBC 2.8* 3.2*  HGB 11.1* 10.7*  HCT 32.4* 31.6*  MCV 94.7 94.3  PLT 91* 88*   Basic Metabolic Panel: Recent Labs  Lab 06/26/23 1023 06/28/23 1905 06/29/23 0257 06/30/23 0329  NA 142 142 144 140  K 3.2* 2.9* 3.7 2.8*  CL 99 104 112* 111  CO2 29 28 23 23   GLUCOSE 151* 125* 124* 91  BUN 13 16 15 13   CREATININE 0.75 0.80 0.61 0.68  CALCIUM 9.2 8.8* 8.7* 8.2*  MG  --  2.4  --  2.0  PHOS  --  3.3  --  2.7    Studies: No results found.  Scheduled Meds:  insulin  aspart  0-5 Units Subcutaneous QHS   insulin  aspart  0-9 Units Subcutaneous TID WC   insulin  glargine-yfgn  4 Units Subcutaneous QHS   lactulose   20 g Oral BID   [START ON 07/01/2023] levothyroxine   75 mcg Oral QAC breakfast   melatonin  2.5 mg Oral QHS   nadolol   20 mg Oral Daily   pantoprazole   40 mg Oral Daily   potassium chloride   40 mEq Oral Q4H  Continuous Infusions:  ampicillin -sulbactam (UNASYN ) IV 3 g (06/30/23 1224)   potassium chloride  10 mEq (06/30/23 1228)   PRN Meds: acetaminophen , albuterol , dextromethorphan-guaiFENesin , diphenhydrAMINE , fentaNYL  (SUBLIMAZE ) injection, hydrALAZINE , lactulose , ondansetron  (ZOFRAN ) IV  Time spent: 55 minutes  Author: Althia Atlas. MD [facility] 06/30/2023 12:36 PM  To reach On-call, see care teams to locate the attending and reach out to them via [email or web address]. If 7PM-7AM, please contact night-coverage If you still have difficulty reaching the attending provider, please page the [facility] (Director on Call) for [facility] on amion for assistance.

## 2023-07-01 ENCOUNTER — Other Ambulatory Visit: Payer: Self-pay

## 2023-07-01 ENCOUNTER — Encounter (INDEPENDENT_AMBULATORY_CARE_PROVIDER_SITE_OTHER): Payer: Self-pay

## 2023-07-01 DIAGNOSIS — J690 Pneumonitis due to inhalation of food and vomit: Secondary | ICD-10-CM | POA: Diagnosis not present

## 2023-07-01 LAB — CBC
HCT: 29 % — ABNORMAL LOW (ref 36.0–46.0)
Hemoglobin: 10.1 g/dL — ABNORMAL LOW (ref 12.0–15.0)
MCH: 32.7 pg (ref 26.0–34.0)
MCHC: 34.8 g/dL (ref 30.0–36.0)
MCV: 93.9 fL (ref 80.0–100.0)
Platelets: 70 10*3/uL — ABNORMAL LOW (ref 150–400)
RBC: 3.09 MIL/uL — ABNORMAL LOW (ref 3.87–5.11)
RDW: 15.6 % — ABNORMAL HIGH (ref 11.5–15.5)
WBC: 3.3 10*3/uL — ABNORMAL LOW (ref 4.0–10.5)
nRBC: 0 % (ref 0.0–0.2)

## 2023-07-01 LAB — PHOSPHORUS: Phosphorus: 3.1 mg/dL (ref 2.5–4.6)

## 2023-07-01 LAB — BASIC METABOLIC PANEL WITH GFR
Anion gap: 6 (ref 5–15)
BUN: 11 mg/dL (ref 8–23)
CO2: 21 mmol/L — ABNORMAL LOW (ref 22–32)
Calcium: 8.3 mg/dL — ABNORMAL LOW (ref 8.9–10.3)
Chloride: 113 mmol/L — ABNORMAL HIGH (ref 98–111)
Creatinine, Ser: 0.5 mg/dL (ref 0.44–1.00)
GFR, Estimated: >60 mL/min (ref >60)
Glucose, Bld: 102 mg/dL — ABNORMAL HIGH (ref 70–99)
Potassium: 3.8 mmol/L (ref 3.5–5.1)
Sodium: 140 mmol/L (ref 135–145)

## 2023-07-01 LAB — GLUCOSE, CAPILLARY
Glucose-Capillary: 120 mg/dL — ABNORMAL HIGH (ref 70–99)
Glucose-Capillary: 168 mg/dL — ABNORMAL HIGH (ref 70–99)

## 2023-07-01 LAB — MAGNESIUM: Magnesium: 2.1 mg/dL (ref 1.7–2.4)

## 2023-07-01 LAB — AMMONIA: Ammonia: 47 umol/L — ABNORMAL HIGH (ref 9–35)

## 2023-07-01 MED ORDER — AMOXICILLIN-POT CLAVULANATE 875-125 MG PO TABS
1.0000 | ORAL_TABLET | Freq: Two times a day (BID) | ORAL | 0 refills | Status: AC
  Filled 2023-07-01: qty 10, 5d supply, fill #0

## 2023-07-01 NOTE — Discharge Summary (Signed)
 [facility] Discharge Summary   Patient: Annette Foster KDT:[id number]  PCP: Westley Hammers, MD  Date of admission: 06/28/2023   Date of discharge:  07/01/2023     Discharge Diagnoses:  Principal Problem:   Aspiration pneumonia (HCC) Active Problems:   Acute hepatic encephalopathy (HCC)   Decompensated nonalcoholic liver cirrhosis (HCC)   Pancytopenia (HCC)   Hypokalemia   Hypothyroidism, unspecified   HTN (hypertension)   Hyperlipidemia, unspecified   Diabetes mellitus without complication (HCC)   Chronic diastolic CHF (congestive heart failure) (HCC)   OSA on CPAP   Admitted From: Home Disposition:  Home w/HH  Recommendations for Outpatient Follow-up:  F/u with PCP in 1 wk, need Follow-up chest x-ray in 4-6 weeks is recommended to ensure resolution. Follow up LABS/TEST:  CXR in 4-6 wks   Follow-up Information     Westley Hammers, MD Follow up.   Specialty: Internal Medicine Why: hospital follow up Contact information: [address]   [place] [address] [phone]         [facility] Follow up.   Specialty: Home Health Services Why: Agency will call to schedule first PT/OT appointments. Contact information: [address] [place] [address] [phone]                Diet recommendation: Cardiac and Carb modified diet  Activity: The patient is advised to gradually reintroduce usual activities, as tolerated  Discharge Condition: stable  Code Status: Full code   History of present illness: As per the H and P dictated on admission Hospital Course:  Annette Foster is a 83 y.o. 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   Patient was recently hospitalized from 4/23 - 5/7 due to worsening ascites. Patient had paracentesis with 4.1 L fluid removed and is s/p TIPS on 4/28.  Per her daughter, patient has been confused since yesterday, difficult to wake up, not following command.  When I saw patient in ED, patient is confused, seems to recognize her daughter, but not orientated to time and the place.  She moves all extremities.  No facial droop.  Per her daughter, patient does not seem to have abdominal pain or chest pain.  No active nausea, vomiting, diarrhea noted.  Her daughter states that patient did have SOB or cough at home, but per ED physician, patient has some cough in ED. Not sure if patient has symptoms of UTI.  Patient fell last weekend, no significant injury.  Patient does not have abdominal distention this time.   Data reviewed independently and ED Course: pt was found to have ammonia 46, pancytopenia with WBC 2.8, hemoglobin 11.1 and platelet 91 (WBC 6.0, hemoglobin 11.1, platelet 114 on 06/16/2023), GFR> 60, potassium 2.9, temperature normal, blood pressure 134/57, heart rate 83, RR 22, oxygen saturation 99% on room air.  CT of head is negative for acute intracranial abnormalities.  Chest x-ray showed multifocal patchy infiltration particularly on the left side.  Patient is admitted to PCU as inpatient.   Assessment and Plan:   # Possible Aspiration pneumonia  Patient does not have fever and no O2 requirement. Pro calcitonin negative.  S/p Unasayn. SLP eval done, ecommended regular diet with thin liquids. Bcx NGTD.  Patient was discharged on Augmentin  twice daily for 5 days.  Follow-up with PCP, repeat chest x-ray after 4 to 6 weeks.     # Acute hepatic encephalopathy Likely precipitated by infection v patient reportedly constipated.  Started lactulose , patient has started having bowel movements.  Mental  status improved, currently back to her baseline.  Encephalopathy resolved.  Patient was advised to continue lactulose  daily and Dulcolax as needed.  Follow-up with PCP.  # Decompensated Liver cirrhosis c/b esophageal varices s/p banding, portal gastropathy  S/p TIPS, no abd distention but hepatic encephalopathy   # Pancytopenia  Chronic. H/o IDA on IV Fe. Leukopenia and thrombocytopenia worse this hospitalization. Likely due to cirrhosis Follows with heme (last saw them 05/2023)   # HFpEF: Some lower extremity edema and mildly elevated BNP. Resumed torsemide  and spironolactone  home dose on discharge # Hypokalemia secondary to diuresis, Potassium repleted. # Hypothyroidism, unspecified: Synthroid  # HTN (hypertension): Resumed nadolol  home dose and also resumed torsemide  and spironolactone .  Advised to monitor BP at home and follow with PCP. # Hyperlipidemia, unspecified: Resumed Zocor  and fenofibrate  # Diabetes mellitus without complication: Recent A1c 5.6, well-controlled.  Patient is taking Tresiba  6 units daily.  Resumed home dose insulin , recommended to monitor CBG at home and continue diabetic diet. # OSA continue CPAP Body mass index is 24.99 kg/m.  Nutrition Interventions:  - Patient was instructed, not to drive, operate heavy machinery, perform activities at heights, swimming or participation in water activities or provide baby sitting services while on Pain, Sleep and Anxiety Medications; until her outpatient Physician has advised to do so again.  - Also recommended to not to take more than prescribed Pain, Sleep and Anxiety Medications.  Patient was seen by physical therapy, who recommended Home health, which was arranged. On the day of the discharge the patient's vitals were stable, and no other acute medical condition were reported by patient. the patient was felt safe to be discharge at Home with Home health.  Consultants: None Procedures: None  Discharge Exam: General:  Appear in no distress, no Rash; Oral Mucosa Clear, moist. Cardiovascular: S1 and S2 Present, no Murmur, Respiratory: normal respiratory effort, Bilateral Air entry present and no Crackles, no wheezes Abdomen: Bowel Sound present, Soft and no tenderness, no hernia Extremities: no Pedal edema, no calf tenderness Neurology: alert and oriented to time, place, and person affect appropriate.  Filed Weights   06/28/23 1900 06/29/23 2320 07/01/23 0441  Weight: 64.6 kg 64.8 kg 64 kg   Vitals:   07/01/23 0857 07/01/23 1156  BP: (!) 128/53 (!) 124/57  Pulse: (!) 59 61  Resp: 19 19  Temp: 97.9 F (36.6 C) 98 F (36.7 C)  SpO2: 100% 100%    DISCHARGE MEDICATION: Allergies as of 07/01/2023       Reactions   Codeine Itching, Nausea And Vomiting, Other (See Comments)   Demeclocycline Rash   Demerol [meperidine] Itching, Nausea And Vomiting   Hydrocodone Itching, Nausea And Vomiting   Morphine  Nausea Only   Other Other (See Comments)   Oxycodone  Itching, Nausea And Vomiting   Pentazocine Itching, Nausea And Vomiting, Other (See Comments)   Tetracyclines & Related Rash   Coal Tar Extract Other (See Comments)   Hydrocodone-acetaminophen  Other (See Comments)   Salicylic Acid Other (See Comments)   Tetracycline Hcl Other (See Comments)        Medication List     STOP taking  these medications    potassium chloride  SA 20 MEQ tablet Commonly known as: Klor-Con  M20       TAKE these medications    albuterol  108 (90 Base) MCG/ACT inhaler Commonly known as: VENTOLIN  HFA Inhale 2 puffs into the lungs every 4 (four) hours as needed for wheezing or shortness of breath.   amoxicillin -clavulanate 875-125 MG tablet Commonly known as: AUGMENTIN  Take 1 tablet by mouth 2 (two) times daily for 5 days.   clobetasol  ointment 0.05 % Commonly known as: TEMOVATE  Apply 1 application topically as needed (vaginal irritation).   clotrimazole  1 % cream Commonly known as: LOTRIMIN  Apply 1  Application topically 2 (two) times daily as needed.   [employer] POTASSIUM GLUCONATE PO Take 650 mg by mouth daily.   cyanocobalamin 1000 MCG/ML injection Commonly known as: VITAMIN B12 1,000 mcg every 30 (thirty) days.   esomeprazole 40 MG capsule Commonly known as: NEXIUM Take 40 mg by mouth 2 (two) times daily before a meal.   fenofibrate  160 MG tablet Take 160 mg by mouth at bedtime.   gabapentin  300 MG capsule Commonly known as: NEURONTIN  Take 300 mg by mouth 2 (two) times daily.   insulin  aspart 100 UNIT/ML FlexPen Commonly known as: NOVOLOG  Inject 2 Units into the skin 3 (three) times daily with meals. If eating and Blood Glucose (BG) 80 or higher inject 2 units for meal coverage and add correction dose per scale. If not eating, correction dose only. BG <150= 0 unit; BG 150-200= 1 unit; BG 201-250= 2 unit; BG 251-300= 3 unit; BG 301-350= 4 unit; BG 351-400= 5 unit; BG >400= 6 unit and Call Primary care.   ipratropium 0.03 % nasal spray Commonly known as: ATROVENT Place 1 spray into both nostrils 2 (two) times daily as needed for rhinitis.   lactulose  10 GM/15ML solution Commonly known as: CHRONULAC  Take 45 mLs (30 g total) by mouth daily.   levocetirizine 5 MG tablet Commonly known as: XYZAL Take 5 mg by mouth every evening.   levothyroxine  75 MCG tablet Commonly known as: SYNTHROID  Take 75 mcg by mouth daily before breakfast.   magnesium  oxide 400 MG tablet Commonly known as: MAG-OX Take 400 mg by mouth 2 (two) times daily.   midodrine  10 MG tablet Commonly known as: PROAMATINE  Take 1 tablet (10 mg total) by mouth 3 (three) times daily with meals. What changed: Another medication with the same name was removed. Continue taking this medication, and follow the directions you see here.   Mounjaro  15 MG/0.5ML Pen Generic drug: tirzepatide  Inject 15 mg into the skin once a week. On Fridays   nadolol  20 MG tablet Commonly known as: CORGARD  Take 1 tablet (20 mg  total) by mouth daily.   ondansetron  4 MG tablet Commonly known as: ZOFRAN  Take 1 tablet (4 mg total) by mouth every 6 (six) hours as needed for nausea.   simvastatin  20 MG tablet Commonly known as: ZOCOR  Take 20 mg by mouth at bedtime.   spironolactone  50 MG tablet Commonly known as: ALDACTONE  Take 1.5 tablets (75 mg total) by mouth daily.   torsemide  20 MG tablet Commonly known as: DEMADEX  Take 3 tablets (60 mg total) by mouth daily.   traMADol  50 MG tablet Commonly known as: ULTRAM  Take 1 tablet (50 mg total) by mouth every 8 (eight) hours as needed. What changed: reasons to take this   Tresiba  FlexTouch 200 UNIT/ML FlexTouch Pen Generic drug: insulin  degludec Inject 6 Units into the skin at bedtime.  valACYclovir  500 MG tablet Commonly known as: VALTREX  Take 500 mg by mouth 2 (two) times daily as needed.   Vitamin D  50 MCG (2000 UT) Caps Take 2,000 Units by mouth daily.       Allergies  Allergen Reactions   Codeine Itching, Nausea And Vomiting and Other (See Comments)   Demeclocycline Rash   Demerol [Meperidine] Itching and Nausea And Vomiting   Hydrocodone Itching and Nausea And Vomiting   Morphine  Nausea Only   Other Other (See Comments)   Oxycodone  Itching and Nausea And Vomiting   Pentazocine Itching, Nausea And Vomiting and Other (See Comments)   Tetracyclines & Related Rash   Coal Tar Extract Other (See Comments)   Hydrocodone-Acetaminophen  Other (See Comments)   Salicylic Acid Other (See Comments)   Tetracycline Hcl Other (See Comments)   Discharge Instructions     Call MD for:  difficulty breathing, headache or visual disturbances   Complete by: As directed    Call MD for:  extreme fatigue   Complete by: As directed    Call MD for:  persistant dizziness or light-headedness   Complete by: As directed    Call MD for:  persistant nausea and vomiting   Complete by: As directed    Call MD for:  severe uncontrolled pain   Complete by: As  directed    Call MD for:  temperature >100.4   Complete by: As directed    Diet - low sodium heart healthy   Complete by: As directed    Discharge instructions   Complete by: As directed    F/u with PCP in 1 wk, need Follow-up chest x-ray in 4-6 weeks is recommended to ensure resolution.   Increase activity slowly   Complete by: As directed        The results of significant diagnostics from this hospitalization (including imaging, microbiology, ancillary and laboratory) are listed below for reference.    Significant Diagnostic Studies: DG Chest Portable 1 View Result Date: 06/28/2023 CLINICAL DATA:  Confusion and cough. EXAM: PORTABLE CHEST 1 VIEW COMPARISON:  Chest x-ray 05/30/2023.  Chest CT 04/22/2023. FINDINGS: There are multifocal patchy opacities in the left upper lobe and left lower lung. Left lower lung airspace disease has increased. There is no pleural effusion or pneumothorax. The heart is enlarged, unchanged. Right-sided pacemaker is again seen. No acute fractures are identified. IMPRESSION: Multifocal patchy opacities in the left upper lobe and left lower lung, increased in the left lower lung. Findings are concerning for multifocal pneumonia. Follow-up chest x-ray in 4-6 weeks is recommended to ensure resolution. Electronically Signed   By: Tyron Gallon M.D.   On: 06/28/2023 21:02   CT Head Wo Contrast Result Date: 06/28/2023 CLINICAL DATA:  Mental status change, unknown cause EXAM: CT HEAD WITHOUT CONTRAST TECHNIQUE: Contiguous axial images were obtained from the base of the skull through the vertex without intravenous contrast. RADIATION DOSE REDUCTION: This exam was performed according to the departmental dose-optimization program which includes automated exposure control, adjustment of the mA and/or kV according to patient size and/or use of iterative reconstruction technique. COMPARISON:  CT head 09/11/2022. FINDINGS: Brain: No evidence of acute infarction, hemorrhage,  hydrocephalus, extra-axial collection or mass lesion/mass effect. Vascular: No hyperdense vessel. Skull: No acute fracture. Sinuses/Orbits: Paranasal sinus mucosal thickening. No acute orbital findings. Other: No mastoid effusions. IMPRESSION: 1. No evidence of acute intracranial abnormality. 2. Paranasal sinus mucosal thickening. Electronically Signed   By: Stevenson Elbe M.D.   On: 06/28/2023 19:51  CUP PACEART REMOTE DEVICE CHECK Result Date: 06/20/2023 PPM Scheduled remote reviewed. Normal device function.  Presenting rhythm:  AP/VP 1 AT event, duration 8sec, EGM c/w AT/AFL, OAC contraindicated per PA report Next remote 91 days. LA, CVRS  CT ANGIO ABD/PELVIS BRTO Result Date: 06/15/2023 CLINICAL DATA:  Abdominal pain post tips. EXAM: CTA ABDOMEN AND PELVIS WITHOUT AND WITH CONTRAST TECHNIQUE: Multidetector CT imaging of the abdomen and pelvis was performed using the standard protocol during bolus administration of intravenous contrast. Multiplanar reconstructed images and MIPs were obtained and reviewed to evaluate the vascular anatomy. RADIATION DOSE REDUCTION: This exam was performed according to the departmental dose-optimization program which includes automated exposure control, adjustment of the mA and/or kV according to patient size and/or use of iterative reconstruction technique. CONTRAST:  75mL OMNIPAQUE  IOHEXOL  350 MG/ML SOLN COMPARISON:  CT abdomen and pelvis 05/21/2023 FINDINGS: VASCULAR Aorta: Normal caliber aorta without aneurysm, dissection, vasculitis or significant stenosis. There are atherosclerotic calcifications of the aorta. Celiac: Patent without evidence of aneurysm, dissection, vasculitis or significant stenosis. SMA: Patent without evidence of aneurysm, dissection, vasculitis or significant stenosis. Renals: Both renal arteries are patent without evidence of aneurysm, dissection, vasculitis, fibromuscular dysplasia or significant stenosis. Accessory right renal artery  present. IMA: Patent without evidence of aneurysm, dissection, vasculitis or significant stenosis. Inflow: Patent without evidence of aneurysm, dissection, vasculitis or significant stenosis. Proximal Outflow: Bilateral common femoral and visualized portions of the superficial and profunda femoral arteries are patent without evidence of aneurysm, dissection, vasculitis or significant stenosis. Veins: IVC is normal in size. New tips catheter present in the left lobe of the liver. The catheter appears grossly patent. There is normal flow within the bilateral portal veins, main portal vein, splenic vein and superior mesenteric vein. Review of the MIP images confirms the above findings. NON-VASCULAR Lower chest: There is some interstitial opacities and ground-glass opacities in the lung bases. Hepatobiliary: Nodular liver contours compatible with cirrhosis. The gallbladder is surgically absent. There is no biliary ductal dilatation. Pancreas: Unremarkable. No pancreatic ductal dilatation or surrounding inflammatory changes. Spleen: Spleen is mildly enlarged, unchanged. Adrenals/Urinary Tract: Adrenal glands are unremarkable. Kidneys are normal, without renal calculi, focal lesion, or hydronephrosis. Bladder is unremarkable. Stomach/Bowel: Stomach is within normal limits. No evidence of bowel wall thickening, distention, or inflammatory changes. There is a large amount of stool throughout the entire colon. Air-fluid level is noted in the stomach. The appendix is not seen. Lymphatic: No enlarged lymph nodes are identified. Reproductive: Status post hysterectomy. No adnexal masses. Other: No abdominal wall hernia or abnormality. No abdominopelvic ascites. Musculoskeletal: Degenerative changes affect the spine. IMPRESSION: 1. No acute localizing process in the abdomen or pelvis. 2. New tips catheter in place. The catheter appears grossly patent. 3. Cirrhosis with splenomegaly. 4. Large amount of stool throughout the colon.  5. Interstitial and ground-glass opacities in the lung bases, possibly edema. Aortic Atherosclerosis (ICD10-I70.0). Electronically Signed   By: Tyron Gallon M.D.   On: 06/15/2023 18:52   US  ASCITES (ABDOMEN LIMITED) Result Date: 06/14/2023 CLINICAL DATA:  Abdominal pain. EXAM: LIMITED ABDOMEN ULTRASOUND FOR ASCITES TECHNIQUE: Limited ultrasound survey for ascites was performed in all four abdominal quadrants. COMPARISON:  None Available. FINDINGS: No ascites was demonstrated. IMPRESSION: No ascites. Electronically Signed   By: Sydell Eva M.D.   On: 06/14/2023 18:17   US  Abdomen Limited Result Date: 06/11/2023 CLINICAL DATA:  NASH cirrhosis, history of recurrent ascites now status post TIPS creation. Assess for drainable ascites. EXAM: LIMITED ABDOMEN ULTRASOUND FOR  ASCITES TECHNIQUE: Limited ultrasound survey for ascites was performed in all four abdominal quadrants. COMPARISON:  Paracentesis 06/09/2023 FINDINGS: Sonographic interrogation of the abdomen demonstrates no evidence of ascites. IMPRESSION: Negative for ascites. Electronically Signed   By: Fernando Hoyer M.D.   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 She also has a history of esophageal varices status post prior banding procedures. She presents for elective TIPS creation. EXAM: 1. Ultrasound-guided paracentesis 2. TIPS creation utilizing intracardiac echocardiography (ICE) as intravascular ultrasound guidance MEDICATIONS: In patient already receiving IV Rocephin . No additional prophylaxis administered. ANESTHESIA/SEDATION: General - as administered by the Anesthesia department CONTRAST:  100 mL Omnipaque  300 FLUOROSCOPY TIME:  Radiation exposure index: 746 mGy reference air kerma COMPLICATIONS: None immediate. PROCEDURE: Informed written consent was obtained from the patient after a thorough  discussion of the procedural risks, benefits and alternatives. All questions were addressed. Maximal Sterile Barrier Technique was utilized including caps, mask, sterile gowns, sterile gloves, sterile drape, hand hygiene and skin antiseptic. A timeout was performed prior to the initiation of the procedure. The right upper quadrant was interrogated with ultrasound. There is a relatively small volume ascites in the perihepatic station. A suitable skin entry site was selected. A smaller the was made. Under real-time ultrasound guidance, a 6 French Safe-T-Centesis catheter was advanced into the perihepatic fluid collection. Paracentesis was then performed yielding approximately 1.5 L of golden the ascites. The right common femoral vein was interrogated with ultrasound and found to be widely patent. An image was obtained and stored for the medical record. Local anesthesia was attained by infiltration with 1% lidocaine . A small dermatotomy was made. Under real-time sonographic guidance, the vessel was punctured with a 21 gauge micropuncture needle. Using standard technique, the initial micro needle was exchanged over a 0.018 micro wire for a transitional 4 [place] micro sheath. The micro sheath was then exchanged over a 0.035 wire for 8 French vascular sheath which was advanced into the inferior vena cava. The 8 French intracardiac echocardiography catheter was then advanced through the sheath and positioned in the inferior vena cava within the hepatic segment. Intravascular ultrasound was then used to identify the portal venous anatomy. The right internal jugular vein was interrogated with ultrasound and found to be widely patent. An image was obtained and stored for the medical record. Local anesthesia was attained by infiltration with 1% lidocaine . A small dermatotomy was made. Under real-time sonographic guidance, the vessel was punctured with a 21 gauge micropuncture needle. Using standard technique, the initial micro  needle was exchanged over a 0.018 micro wire for a transitional 4 [place] micro sheath. The micro sheath was then exchanged over a 0.035 wire for a 10 French dilator in the percutaneous tract was dilated. A 10 French cook flexor sheath was then advanced over the wire and into the inferior vena cava. An angled MPA catheter was advanced through the sheath and used to select several hepatic veins. Hepatic venography was performed. Of the middle and right hepatic veins were catheterized and venography performed. Initially, the catheter was parked in the right hepatic vein and an Amplatz wire advanced. The catheter was exchanged in the tips sheath advanced over the wire. The scorpion set was then advanced over the wire. A single initial pass was attempted, however the angle was suboptimal in the portal vein could not be accessed. The scorpion set was removed in the catheter  was used to select the middle hepatic vein. The Amplatz wire was advanced and parked. The 10 French flexor sheath was advanced into the vein followed by the scorpion set. This time, on a single pass the scorpion device was successfully advanced into the portal vein. Contrast injection confirms puncture of the central left portal vein. The Glidewire Advantage was advanced into the splenic vein. The scorpion set was exchanged for a marker pigtail catheter. A portal venogram was performed. Confirmation of puncture of the central left portal system. Several small collaterals are evident including the left gastric and posterior gastric veins. Pressures were measured. The initial portal venous pressure was 32 mm of Hg and the right atrial pressure was 2 mm of Hg yielding a portosystemic gradient of 30 mmHg. The Amplatz wire was advanced into the splenic vein. The pigtail catheter was removed. The transhepatic tract was dilated to 6 mm using a 6 x 40 mm [place] balloon. The 10 French sheath was then advanced over the balloon as it was deflated. A 6 cm covered  Viatorr stent graft was selected. The stent graft was advanced over the wire and into the main portal vein. The stent was unsheathed. The non covered portion was allowed to open fully and was pulled back snug against the entry point into the left portal vein. The remainder of the stent was then deployed. The stent was post dilated to 8 mm using an 8 x 80 mm Athletis balloon. The pigtail catheter was reintroduced over the wire. Follow-up portal venography demonstrates a well-positioned and widely patent right middle to left portal TIPS. No further filling of venous collaterals. Pressures were again measured. Post tips portal venous pressure was 12 mm of Hg and the right atrial pressure was 5 mm of Hg consistent with a portosystemic gradient of 7 mm Hg. The pigtail catheter was removed over a wire. The right IJ sheath was removed and hemostasis attained by manual pressure. The ice catheter was removed as well as the right common femoral sheath. Hemostasis was attained by manual compression. IMPRESSION: 1. Successful paracentesis yielding 1.5 L. 2. Successful 8 mm TIPS creation. 3. Initial portosystemic gradient was 30 mm of Hg which corrected to 7 mm of Hg following TIPS creation. Patient will be admitted to the hospitalist service for overnight observation. Outpatient follow-up with a TIPS ultrasound in 1 month. Electronically Signed   By: Fernando Hoyer M.D.   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 She also has a history of esophageal varices status post prior banding procedures. She presents for elective TIPS creation. EXAM: 1. Ultrasound-guided paracentesis 2. TIPS creation utilizing intracardiac echocardiography (ICE) as intravascular ultrasound guidance MEDICATIONS: In patient already receiving IV Rocephin . No additional prophylaxis administered.  ANESTHESIA/SEDATION: General - as administered by the Anesthesia department CONTRAST:  100 mL Omnipaque  300 FLUOROSCOPY TIME:  Radiation exposure index: 746 mGy reference air kerma COMPLICATIONS: None immediate. PROCEDURE: Informed written consent was obtained from the patient after a thorough discussion of the procedural risks, benefits and alternatives. All questions were addressed. Maximal Sterile Barrier Technique was utilized including caps, mask, sterile gowns, sterile gloves, sterile drape, hand hygiene and skin antiseptic. A timeout was performed prior to the initiation of the procedure. The right upper quadrant was interrogated with ultrasound. There is a relatively small volume ascites in the perihepatic station. A suitable skin entry site was selected. A smaller  the was made. Under real-time ultrasound guidance, a 6 French Safe-T-Centesis catheter was advanced into the perihepatic fluid collection. Paracentesis was then performed yielding approximately 1.5 L of golden the ascites. The right common femoral vein was interrogated with ultrasound and found to be widely patent. An image was obtained and stored for the medical record. Local anesthesia was attained by infiltration with 1% lidocaine . A small dermatotomy was made. Under real-time sonographic guidance, the vessel was punctured with a 21 gauge micropuncture needle. Using standard technique, the initial micro needle was exchanged over a 0.018 micro wire for a transitional 4 [place] micro sheath. The micro sheath was then exchanged over a 0.035 wire for 8 French vascular sheath which was advanced into the inferior vena cava. The 8 French intracardiac echocardiography catheter was then advanced through the sheath and positioned in the inferior vena cava within the hepatic segment. Intravascular ultrasound was then used to identify the portal venous anatomy. The right internal jugular vein was interrogated with ultrasound and found to be widely patent.  An image was obtained and stored for the medical record. Local anesthesia was attained by infiltration with 1% lidocaine . A small dermatotomy was made. Under real-time sonographic guidance, the vessel was punctured with a 21 gauge micropuncture needle. Using standard technique, the initial micro needle was exchanged over a 0.018 micro wire for a transitional 4 [place] micro sheath. The micro sheath was then exchanged over a 0.035 wire for a 10 French dilator in the percutaneous tract was dilated. A 10 French cook flexor sheath was then advanced over the wire and into the inferior vena cava. An angled MPA catheter was advanced through the sheath and used to select several hepatic veins. Hepatic venography was performed. Of the middle and right hepatic veins were catheterized and venography performed. Initially, the catheter was parked in the right hepatic vein and an Amplatz wire advanced. The catheter was exchanged in the tips sheath advanced over the wire. The scorpion set was then advanced over the wire. A single initial pass was attempted, however the angle was suboptimal in the portal vein could not be accessed. The scorpion set was removed in the catheter was used to select the middle hepatic vein. The Amplatz wire was advanced and parked. The 10 French flexor sheath was advanced into the vein followed by the scorpion set. This time, on a single pass the scorpion device was successfully advanced into the portal vein. Contrast injection confirms puncture of the central left portal vein. The Glidewire Advantage was advanced into the splenic vein. The scorpion set was exchanged for a marker pigtail catheter. A portal venogram was performed. Confirmation of puncture of the central left portal system. Several small collaterals are evident including the left gastric and posterior gastric veins. Pressures were measured. The initial portal venous pressure was 32 mm of Hg and the right atrial pressure was 2 mm of Hg  yielding a portosystemic gradient of 30 mmHg. The Amplatz wire was advanced into the splenic vein. The pigtail catheter was removed. The transhepatic tract was dilated to 6 mm using a 6 x 40 mm [place] balloon. The 10 French sheath was then advanced over the balloon as it was deflated. A 6 cm covered Viatorr stent graft was selected. The stent graft was advanced over the wire and into the main portal vein. The stent was unsheathed. The non covered portion was allowed to open fully and was pulled back snug against the entry point into the left portal vein. The remainder  of the stent was then deployed. The stent was post dilated to 8 mm using an 8 x 80 mm Athletis balloon. The pigtail catheter was reintroduced over the wire. Follow-up portal venography demonstrates a well-positioned and widely patent right middle to left portal TIPS. No further filling of venous collaterals. Pressures were again measured. Post tips portal venous pressure was 12 mm of Hg and the right atrial pressure was 5 mm of Hg consistent with a portosystemic gradient of 7 mm Hg. The pigtail catheter was removed over a wire. The right IJ sheath was removed and hemostasis attained by manual pressure. The ice catheter was removed as well as the right common femoral sheath. Hemostasis was attained by manual compression. IMPRESSION: 1. Successful paracentesis yielding 1.5 L. 2. Successful 8 mm TIPS creation. 3. Initial portosystemic gradient was 30 mm of Hg which corrected to 7 mm of Hg following TIPS creation. Patient will be admitted to the hospitalist service for overnight observation. Outpatient follow-up with a TIPS ultrasound in 1 month. Electronically Signed   By: Fernando Hoyer M.D.   On: 06/09/2023 16:39   US  Paracentesis Result Date: 06/05/2023 INDICATION: Patient with history of NASH cirrhosis, recurrent ascites. Request to IR for paracentesis. EXAM: ULTRASOUND GUIDED THERAPEUTIC PARACENTESIS MEDICATIONS: 6 mL 1% lidocaine   COMPLICATIONS: None immediate. PROCEDURE: Informed written consent was obtained from the patient after a discussion of the risks, benefits and alternatives to treatment. A timeout was performed prior to the initiation of the procedure. Initial ultrasound scanning demonstrates a large amount of ascites within the right lower abdominal quadrant. The right lower abdomen was prepped and draped in the usual sterile fashion. 1% lidocaine  was used for local anesthesia. Following this, a 5 Fr, 7-cm, Merit One Step centesis catheter was introduced. An ultrasound image was saved for documentation purposes. The paracentesis was performed. The catheter was removed and a dressing was applied. The patient tolerated the procedure well without immediate post procedural complication. FINDINGS: A total of approximately 4.1 L of clear, amber fluid was removed. IMPRESSION: Successful ultrasound-guided paracentesis yielding 4.1 liters of peritoneal fluid. Performed by Nathan Bake, PA-C PLAN: The patient has required >/=2 paracenteses in a 30 day period and a formal evaluation by the [facility] has been arranged. Electronically Signed   By: Elene Griffes M.D.   On: 06/05/2023 15:41    Microbiology: Recent Results (from the past 240 hours)  Blood Culture (routine x 2)     Status: None (Preliminary result)   Collection Time: 06/28/23  9:15 PM   Specimen: BLOOD  Result Value Ref Range Status   Specimen Description BLOOD RIGHT WRIST  Final   Special Requests   Final    BOTTLES DRAWN AEROBIC AND ANAEROBIC Blood Culture results may not be optimal due to an inadequate volume of blood received in culture bottles   Culture   Final    NO GROWTH 3 DAYS Performed at [facility], [address] [place], [place] [address]    Report Status PENDING  Incomplete  Blood Culture (routine x 2)     Status: None (Preliminary result)   Collection Time: 06/28/23  9:30 PM    Specimen: BLOOD  Result Value Ref Range Status   Specimen Description BLOOD RIGHT FOREARM  Final   Special Requests   Final    BOTTLES DRAWN AEROBIC AND ANAEROBIC Blood Culture results may not be optimal due to an inadequate volume of blood received in culture bottles   Culture  Final    NO GROWTH 3 DAYS Performed at [facility], [address] [place], [place] [address]    Report Status PENDING  Incomplete     Labs: CBC: Recent Labs  Lab 06/28/23 1905 06/29/23 0257 07/01/23 0600  WBC 2.8* 3.2* 3.3*  HGB 11.1* 10.7* 10.1*  HCT 32.4* 31.6* 29.0*  MCV 94.7 94.3 93.9  PLT 91* 88* 70*   Basic Metabolic Panel: Recent Labs  Lab 06/26/23 1023 06/28/23 1905 06/29/23 0257 06/30/23 0329 07/01/23 0600  NA 142 142 144 140 140  K 3.2* 2.9* 3.7 2.8* 3.8  CL 99 104 112* 111 113*  CO2 29 28 23 23  21*  GLUCOSE 151* 125* 124* 91 102*  BUN 13 16 15 13 11   CREATININE 0.75 0.80 0.61 0.68 0.50  CALCIUM 9.2 8.8* 8.7* 8.2* 8.3*  MG  --  2.4  --  2.0 2.1  PHOS  --  3.3  --  2.7 3.1   Liver Function Tests: Recent Labs  Lab 06/28/23 1905 06/29/23 0257 06/30/23 0329  AST 46* 48* 39  ALT 21 26 18   ALKPHOS 56 51 48  BILITOT 3.0* 3.4* 2.5*  PROT 6.7 6.3* 5.6*  ALBUMIN  3.3* 3.0* 2.5*   No results for input(s): "LIPASE", "AMYLASE" in the last 168 hours. Recent Labs  Lab 06/28/23 2004 07/01/23 0600  AMMONIA 46* 47*   Cardiac Enzymes: No results for input(s): "CKTOTAL", "CKMB", "CKMBINDEX", "TROPONINI" in the last 168 hours. BNP (last 3 results) Recent Labs    05/21/23 1555 05/30/23 1809 06/28/23 1905  BNP 200.2* 181.6* 222.6*   CBG: Recent Labs  Lab 06/30/23 1114 06/30/23 1642 06/30/23 2122 07/01/23 0849 07/01/23 1154  GLUCAP 135* 112* 89 120* 168*    Time spent: 35 minutes  Signed:  Althia Atlas  [facility] 07/01/2023 1:49 PM

## 2023-07-01 NOTE — Care Management Important Message (Signed)
 Important Message  Patient Details  Name: Annette Foster MRN: [id number] Date of Birth: 1940/09/23   Important Message Given:  Yes - Medicare IM     Rondell Frick W, CMA 07/01/2023, 11:54 AM

## 2023-07-01 NOTE — TOC Transition Note (Signed)
 Transition of Care [facility]) - Discharge Note   Patient Details  Name: Annette Foster MRN: [id number] Date of Birth: Jun 22, 1940  Transition of Care [facility]) CM/SW Contact:  Elsie Halo, RN Phone Number: 07/01/2023, 11:44 AM   Clinical Narrative:    Patient is medically clear for discharge to home with home health. TOC spoke with Carmelina Chinchilla [phone] with  [facility] who accepted referral to resume [facility] PT/OT.       Barriers to Discharge: Continued Medical Work up   Patient Goals and CMS Choice            Discharge Placement                       Discharge Plan and Services Additional resources added to the After Visit Summary for                                       Social Drivers of Health (SDOH) Interventions SDOH Screenings   Food Insecurity: No Food Insecurity (06/29/2023)  Housing: Low Risk  (06/29/2023)  Transportation Needs: No Transportation Needs (06/29/2023)  Utilities: Not At Risk (06/29/2023)  Financial Resource Strain: Medium Risk (05/02/2023)   Received from [facility]  Physical Activity: Unknown (01/30/2018)   Received from [facility]  Social Connections: Moderately Isolated (06/29/2023)  Tobacco Use: Low Risk  (06/29/2023)     Readmission Risk Interventions     No data to display

## 2023-07-01 NOTE — Progress Notes (Deleted)
 Physical Therapy Treatment Patient Details Name: Annette Foster MRN: [id number] DOB: Nov 27, 1940 Today's Date: 07/01/2023   History of Present Illness 83 y.o. 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 Patient presented to ED 06/28/23 via EMS c/o lethargy and AMS.    PT Comments  Pt ready for session.  She walks x 1 lap with no AD and generally guarded gait and poor confidence.  While resistant she agrees to RW trial and makes an additional lap with cga x 1 and improved balance and confidence.  She agrees RW is helpful at this time attributing balance deficits to footwear.  Pt would benefit from RW for longer distances while she increases strength especially outside and community ambulation.  HR up to 136 max today with gait.   If plan is discharge home, recommend the following: A little help with walking and/or transfers;A little help with bathing/dressing/bathroom;Assistance with cooking/housework;Assist for transportation;Help with stairs or ramp for entrance   Can travel by private vehicle        Equipment Recommendations  Rolling walker (2 wheels)    Recommendations for Other Services       Precautions / Restrictions Precautions Precautions: Fall Recall of Precautions/Restrictions: Intact Restrictions Weight Bearing Restrictions Per Provider Order: No     Mobility  Bed Mobility Overal bed mobility: Modified Independent               Patient Response: Cooperative  Transfers Overall transfer level: Modified independent   Transfers: Sit to/from Stand Sit to Stand: Modified independent (Device/Increase time)                 Ambulation/Gait Ambulation/Gait assistance: Contact guard assist Gait Distance (Feet): 120 Feet Assistive device: Rolling walker (2 wheels), None Gait Pattern/deviations: Step-through pattern, Decreased step length - right, Decreased step length - left Gait velocity: decreased     General Gait Details: x 1 lap with no AD and x 1 lap with RW   Stairs             Wheelchair Mobility     Tilt Bed Tilt Bed Patient Response: Cooperative  Modified Rankin (Stroke Patients Only)       Balance Overall balance assessment: Needs assistance Sitting-balance support: Feet supported Sitting balance-Leahy Scale: Normal     Standing balance support: No upper extremity supported, During functional activity Standing balance-Leahy Scale: Fair                              Hotel manager: Impaired  Cognition Arousal: Alert Behavior During Therapy: WFL for tasks assessed/performed   PT - Cognitive impairments: No apparent impairments                         Following commands: Intact      Cueing Cueing Techniques: Verbal cues  Exercises      General Comments        Pertinent Vitals/Pain Pain Assessment Pain Assessment: No/denies pain    Home Living                          Prior Function  PT Goals (current goals can now be found in the care plan section) Progress towards PT goals: Progressing toward goals    Frequency    Min 2X/week      PT Plan      Co-evaluation              AM-PAC PT "6 Clicks" Mobility   Outcome Measure  Help needed turning from your back to your side while in a flat bed without using bedrails?: None Help needed moving from lying on your back to sitting on the side of a flat bed without using bedrails?: None Help needed moving to and from a bed to a chair (including a wheelchair)?: None Help needed standing up from a chair using your arms (e.g.,  wheelchair or bedside chair)?: None Help needed to walk in hospital room?: A Little Help needed climbing 3-5 steps with a railing? : A Little 6 Click Score: 22    End of Session Equipment Utilized During Treatment: Gait belt Activity Tolerance: Patient tolerated treatment well Patient left:  (on commode in bathroom with OT) Nurse Communication: Mobility status PT Visit Diagnosis: Other abnormalities of gait and mobility (R26.89);Difficulty in walking, not elsewhere classified (R26.2);Muscle weakness (generalized) (M62.81)     Time: 1006-1020 PT Time Calculation (min) (ACUTE ONLY): 14 min  Charges:    $Gait Training: 8-22 mins PT General Charges $$ ACUTE PT VISIT: 1 Visit                   Charlanne Cong, PTA 07/01/23, 10:30 AM

## 2023-07-01 NOTE — Progress Notes (Signed)
   07/01/23 1000  Spiritual Encounters  Type of Visit Initial  Care provided to: Patient  Referral source Patient request  Reason for visit Advance directives  OnCall Visit No  Spiritual Framework  Presenting Themes Meaning/purpose/sources of inspiration;Significant life change  Community/Connection Family  Interventions  Spiritual Care Interventions Made Established relationship of care and support;Compassionate presence;Reflective listening;Meditation;Prayer;Encouragement  Intervention Outcomes  Outcomes Connection to spiritual care  Spiritual Care Plan  Spiritual Care Issues Still Outstanding Referring to oncoming chaplain for further support  Advance Directives (For Healthcare)  Does Patient Have a Medical Advance Directive? No   Chaplain provided compassionate presence, listened, encouraged, and prayed with patient. Chaplain explain Advance Directive and left document with Pt. Pt wanted to wait on daughter to come to look over everything. Chaplain shared when you are ready please have someone call the chaplain.

## 2023-07-01 NOTE — Progress Notes (Signed)
 Mobility Specialist - Progress Note   07/01/23 1414  Mobility  Activity Transferred to/from [facility];Ambulated with assistance in room  Level of Assistance Standby assist, set-up cues, supervision of patient - no hands on  Assistive Device None  Distance Ambulated (ft) 12 ft  Activity Response Tolerated well  Mobility visit 1 Mobility  Mobility Specialist Start Time (ACUTE ONLY) 1203  Mobility Specialist Stop Time (ACUTE ONLY) 1218  Mobility Specialist Time Calculation (min) (ACUTE ONLY) 15 min   Pt sitting on the BSC upon entry, utilizing RA. Pt STS from [facility] Min[facility], required assist for peri care before returning to the recliner. Pt left seated in the recliner with alarm set and needs within reach. NT notified.  Versa Gore Mobility Specialist 07/01/23 2:15 PM

## 2023-07-02 ENCOUNTER — Telehealth: Payer: Self-pay | Admitting: *Deleted

## 2023-07-02 NOTE — Telephone Encounter (Signed)
 Alert received from [employer] for arrhythmia episode with controlled V-rates but no termination date or time Called patient to assess for symptoms Patient denied having any symptoms currently Patient's device does not have "Quick Check" function so will have to wait til tomorrow to see if patient remains in rhythm Patient and patient's daughter instructed to go to ED if symptoms arise such as SOB, chest pain, dizziness, or fainting, and persist or worsen quickly.  They acknowledged instructions given by this RN All questions answered. Patient appreciative of call.

## 2023-07-03 LAB — CULTURE, BLOOD (ROUTINE X 2)
Culture: NO GROWTH
Culture: NO GROWTH

## 2023-07-09 ENCOUNTER — Ambulatory Visit
Admission: RE | Admit: 2023-07-09 | Discharge: 2023-07-09 | Disposition: A | Discharge status: Home / Self Care | Source: Ambulatory Visit | Attending: Interventional Radiology | Admitting: Interventional Radiology

## 2023-07-09 DIAGNOSIS — Z95828 Presence of other vascular implants and grafts: Secondary | ICD-10-CM | POA: Insufficient documentation

## 2023-07-14 ENCOUNTER — Inpatient Hospital Stay (HOSPITAL_BASED_OUTPATIENT_CLINIC_OR_DEPARTMENT_OTHER): Admitting: Internal Medicine

## 2023-07-14 ENCOUNTER — Inpatient Hospital Stay: Attending: Internal Medicine

## 2023-07-14 ENCOUNTER — Inpatient Hospital Stay

## 2023-07-14 ENCOUNTER — Encounter: Payer: Self-pay | Admitting: Internal Medicine

## 2023-07-14 VITALS — BP 152/64 | HR 78

## 2023-07-14 VITALS — BP 122/58 | HR 70 | Temp 96.9°F | Resp 18 | Ht 63.0 in | Wt 129.8 lb

## 2023-07-14 DIAGNOSIS — Z803 Family history of malignant neoplasm of breast: Secondary | ICD-10-CM | POA: Diagnosis not present

## 2023-07-14 DIAGNOSIS — Z86000 Personal history of in-situ neoplasm of breast: Secondary | ICD-10-CM | POA: Insufficient documentation

## 2023-07-14 DIAGNOSIS — D61818 Other pancytopenia: Secondary | ICD-10-CM | POA: Diagnosis present

## 2023-07-14 DIAGNOSIS — K7460 Unspecified cirrhosis of liver: Secondary | ICD-10-CM | POA: Insufficient documentation

## 2023-07-14 DIAGNOSIS — E611 Iron deficiency: Secondary | ICD-10-CM

## 2023-07-14 DIAGNOSIS — Z87440 Personal history of urinary (tract) infections: Secondary | ICD-10-CM | POA: Diagnosis not present

## 2023-07-14 DIAGNOSIS — R5383 Other fatigue: Secondary | ICD-10-CM | POA: Insufficient documentation

## 2023-07-14 DIAGNOSIS — K921 Melena: Secondary | ICD-10-CM | POA: Insufficient documentation

## 2023-07-14 DIAGNOSIS — Z800 Family history of malignant neoplasm of digestive organs: Secondary | ICD-10-CM | POA: Diagnosis not present

## 2023-07-14 DIAGNOSIS — I4891 Unspecified atrial fibrillation: Secondary | ICD-10-CM | POA: Insufficient documentation

## 2023-07-14 DIAGNOSIS — K7581 Nonalcoholic steatohepatitis (NASH): Secondary | ICD-10-CM | POA: Diagnosis not present

## 2023-07-14 DIAGNOSIS — Z79899 Other long term (current) drug therapy: Secondary | ICD-10-CM | POA: Insufficient documentation

## 2023-07-14 DIAGNOSIS — R0602 Shortness of breath: Secondary | ICD-10-CM | POA: Diagnosis not present

## 2023-07-14 DIAGNOSIS — D500 Iron deficiency anemia secondary to blood loss (chronic): Secondary | ICD-10-CM | POA: Insufficient documentation

## 2023-07-14 LAB — CBC WITH DIFFERENTIAL (CANCER CENTER ONLY)
Abs Immature Granulocytes: 0.01 10*3/uL (ref 0.00–0.07)
Basophils Absolute: 0 10*3/uL (ref 0.0–0.1)
Basophils Relative: 1 %
Eosinophils Absolute: 0.2 10*3/uL (ref 0.0–0.5)
Eosinophils Relative: 4 %
HCT: 34.5 % — ABNORMAL LOW (ref 36.0–46.0)
Hemoglobin: 11.9 g/dL — ABNORMAL LOW (ref 12.0–15.0)
Immature Granulocytes: 0 %
Lymphocytes Relative: 37 %
Lymphs Abs: 1.7 10*3/uL (ref 0.7–4.0)
MCH: 31.9 pg (ref 26.0–34.0)
MCHC: 34.5 g/dL (ref 30.0–36.0)
MCV: 92.5 fL (ref 80.0–100.0)
Monocytes Absolute: 0.4 10*3/uL (ref 0.1–1.0)
Monocytes Relative: 10 %
Neutro Abs: 2.2 10*3/uL (ref 1.7–7.7)
Neutrophils Relative %: 48 %
Platelet Count: 107 10*3/uL — ABNORMAL LOW (ref 150–400)
RBC: 3.73 MIL/uL — ABNORMAL LOW (ref 3.87–5.11)
RDW: 15.8 % — ABNORMAL HIGH (ref 11.5–15.5)
WBC Count: 4.6 10*3/uL (ref 4.0–10.5)
nRBC: 0 % (ref 0.0–0.2)

## 2023-07-14 LAB — SAMPLE TO BLOOD BANK

## 2023-07-14 LAB — CMP (CANCER CENTER ONLY)
ALT: 21 U/L (ref 0–44)
AST: 48 U/L — ABNORMAL HIGH (ref 15–41)
Albumin: 3.3 g/dL — ABNORMAL LOW (ref 3.5–5.0)
Alkaline Phosphatase: 87 U/L (ref 38–126)
Anion gap: 8 (ref 5–15)
BUN: 15 mg/dL (ref 8–23)
CO2: 22 mmol/L (ref 22–32)
Calcium: 8.6 mg/dL — ABNORMAL LOW (ref 8.9–10.3)
Chloride: 103 mmol/L (ref 98–111)
Creatinine: 0.87 mg/dL (ref 0.44–1.00)
GFR, Estimated: >60 mL/min (ref >60)
Glucose, Bld: 103 mg/dL — ABNORMAL HIGH (ref 70–99)
Potassium: 4 mmol/L (ref 3.5–5.1)
Sodium: 133 mmol/L — ABNORMAL LOW (ref 135–145)
Total Bilirubin: 2.9 mg/dL — ABNORMAL HIGH (ref 0.0–1.2)
Total Protein: 7.6 g/dL (ref 6.5–8.1)

## 2023-07-14 MED ORDER — SODIUM CHLORIDE 0.9% FLUSH
10.0000 mL | Freq: Once | INTRAVENOUS | Status: AC | PRN
  Administered 2023-07-14: 10 mL
  Filled 2023-07-14: qty 10

## 2023-07-14 MED ORDER — ONDANSETRON HCL 8 MG PO TABS: 8.0000 mg | ORAL_TABLET | Freq: Three times a day (TID) | ORAL | 1 refills | Status: DC | PRN

## 2023-07-14 MED ORDER — ONDANSETRON HCL 4 MG PO TABS: 4.0000 mg | ORAL_TABLET | Freq: Three times a day (TID) | ORAL | 1 refills | Status: DC | PRN

## 2023-07-14 MED ORDER — IRON SUCROSE 20 MG/ML IV SOLN
200.0000 mg | Freq: Once | INTRAVENOUS | Status: AC
  Administered 2023-07-14: 200 mg via INTRAVENOUS
  Filled 2023-07-14: qty 10

## 2023-07-14 NOTE — Progress Notes (Signed)
Patient tolerated Venofer infusion well. Explained recommendation of 30 min post monitoring. Patient refused to wait post monitoring. Educated on what signs to watch for & to call with any concerns. No questions, discharged. Stable  

## 2023-07-14 NOTE — Assessment & Plan Note (Addendum)
#   Anemia-secondary GI bleed chronic/liver disease.  Hemoglobin-11.8- [Jan 2025- UGIB][Recent Upper GIB]; cannot tolerate PO iron .   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  ANC-1000;  Platelets 107- Today proceed venofer -as hemoglobin is 7.8.  #DECOMPENSATED Cirrhosis-secondary to NASH; HCC screening-[GI-Dr.Wohl]- s/p GIB- s/p Jan 2025; CT scan abdomen pelvis- non-contrast JULY 2024- negative stable; on Torsemide .spironolactone -  s/p TIPS [facility]- April 28th ]Dr.Locklear-  # Tachy-brady syndrome- Hx of A.fib [Dr.End; no wtachnam device]- no anticoagulation.  stable.     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 BIL Mammogram JAN 2023 [Dr.Cintron]-within normal limits.  Not on AI.  Stable.   # DISPOSITION:   # proceed Venofer  today # follow up in  3  month- MD; labs- cbc/cmp;Hold tube.]; iron  studies; ferritin-; possible Venofer -Dr.B  Cc; Dr.Tate

## 2023-07-14 NOTE — Progress Notes (Signed)
 [facility] OFFICE PROGRESS NOTE  Patient Care Team: Westley Hammers, MD as PCP - General (Internal Medicine) End, Veryl Gottron, MD as PCP - Cardiology (Cardiology) Boyce Byes, MD as PCP - Electrophysiology (Cardiology) Gwyn Leos, MD as Consulting Physician (Internal Medicine)   Cancer Staging  No matching staging information was found for the patient.    Oncology History Overview Note  # AUG 2017- DCIS LEFT BREAST ER/PR- POS; positive margins [Dr.Smith]; s/p re-excision; s/p RT [finish RT Nov 10th 2017]; START ARIMIDEX - Jan 2018; Stopped in July 2018- sec to muscle cramps; Sep 2018- start Letrozole .;  JAN 2019-STOPPED Letrozole  sec night sweats/poor tolerance  # Mild pancytopenia [CTApril 2019-cirrhosis/spleneomegaly]. OCT 2018- BMBx- MILD dyspoiesis; variable cellularity [10-50%]; FISH/Cytogenetics-NORMAL; F-One-NGS-declined by insurance.    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   #Frequent UTIs [2019-2020; Dr. Brandon/uro-gyne,[facility]]; Dr.Wohl/  # HRT [clinical trial thru [facility]; stopped July 2017 ]; CPAP   DIAGNOSIS: Left breast DCIS  STAGE:    0     ;GOALS: cure  CURRENT/MOST RECENT THERAPY: surveillaince    Ductal carcinoma in situ (DCIS) of left breast    INTERVAL HISTORY: Accompanied by daughter Avanell Bob.  Ambulating independently.  Annette Foster 83 y.o.  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 Fib-not on anticoagulation and pancytopenia is here for follow-up.  Patient continues to have nausea with the lactulose . Using zofran , needs refills both 4mg  and 8mg , pended.  She is currently having 2-3 loose stools a day.  Complains of easy bruising.  But no gum bleed or nosebleeds.  She  continues to feel short of breath.  Exertional.  Denies any leg swelling.  Denies any worsening abdominal distention.  Review of Systems  Constitutional:  Positive for malaise/fatigue. Negative for chills, diaphoresis, fever and weight loss.  HENT:  Negative for nosebleeds and sore throat.   Eyes:  Negative for double vision.  Respiratory:  Positive for shortness of breath. Negative for cough, hemoptysis, sputum production and wheezing.   Cardiovascular:  Positive for leg swelling. Negative for chest pain, palpitations and orthopnea.  Gastrointestinal:  Negative for abdominal pain, blood in stool, constipation, diarrhea, heartburn, melena, nausea and vomiting.  Musculoskeletal:  Positive for back pain and joint pain.  Skin: Negative.  Negative for itching and rash.  Neurological:  Negative for dizziness, tingling, focal weakness, weakness and headaches.  Endo/Heme/Allergies:  Does not bruise/bleed easily.  Psychiatric/Behavioral:  Negative for depression. The patient is not nervous/anxious and does not have insomnia.       PAST MEDICAL HISTORY :  Past Medical History:  Diagnosis Date   Abdominal pain    Allergy    Cancer (HCC) 2017   breast cancer- Left   Cataract    CHF (congestive heart failure) (HCC)    Collagenous colitis    Coronary artery disease    Diabetes mellitus without complication (HCC)    Diarrhea    Diverticulosis    Dysrhythmia    Fatty liver disease, nonalcoholic    Fibrocystic breast    GERD (gastroesophageal reflux disease)    Heart murmur    Hyperlipidemia    Hypertension    Hypothyroidism    IBS (irritable bowel syndrome)    IDA (iron  deficiency anemia)    Liver cirrhosis (HCC)    Personal history of radiation  therapy 2017   LEFT BREAST CA   Presence of permanent cardiac pacemaker    Sleep apnea    C-Pap    PAST SURGICAL HISTORY :   Past Surgical History:  Procedure Laterality Date   ABDOMINAL HYSTERECTOMY     [place] bso   ABDOMINAL SURGERY      APPENDECTOMY     AV NODE ABLATION N/A 05/17/2021   Procedure: AV NODE ABLATION;  Surgeon: Boyce Byes, MD;  [facility]: [facility];  Service: Cardiovascular;  Laterality: N/A;   BREAST BIOPSY Bilateral 2016   negative   BREAST 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/2015   DCIS, papillary carcinoma in situ   BREAST BIOPSY Left 05/27/2016   BENIGN MAMMARY EPITHELIUM   BREAST BIOPSY Left 11/20/2017   affirm bx x clip BENIGN MAMMARY EPITHELIUM CONSISTENT WITH RAD THERAPY   BREAST EXCISIONAL BIOPSY Right    NEG 1980's   BREAST LUMPECTOMY Left 10/17/2015   DCIS and papillary carcinoma insitu, clear margins   CARDIAC SURGERY     has replacement valve   CATARACT EXTRACTION Right    CATARACT EXTRACTION W/PHACO Left 01/16/2021   Procedure: CATARACT EXTRACTION PHACO AND INTRAOCULAR LENS PLACEMENT (IOC) LEFT DIABETIC 8.46 00:59.8;  Surgeon: Clair Crews, MD;  Location: [facility];  Service: Ophthalmology;  Laterality: Left;   CHOLECYSTECTOMY     COLONOSCOPY WITH PROPOFOL  N/A 03/11/2016   Procedure: COLONOSCOPY WITH PROPOFOL ;  Surgeon: Cassie Click, MD;  Location: [facility] ENDOSCOPY;  Service: Endoscopy;  Laterality: N/A;   COLONOSCOPY WITH PROPOFOL  N/A 02/18/2020   Procedure: COLONOSCOPY WITH PROPOFOL ;  Surgeon: Marnee Sink, MD;  Location: [facility] ENDOSCOPY;  Service: Endoscopy;  Laterality: N/A;   COLONOSCOPY WITH PROPOFOL  N/A 09/10/2022   Procedure: COLONOSCOPY WITH PROPOFOL ;  Surgeon: Luke Salaam, MD;  Location: [facility] ENDOSCOPY;  Service: Gastroenterology;  Laterality: N/A;   ESOPHAGEAL BANDING  09/10/2022   Procedure: ESOPHAGEAL BANDING;  Surgeon: Luke Salaam, MD;  Location: [facility] ENDOSCOPY;  Service: Gastroenterology;;   ESOPHAGEAL BANDING  10/17/2022   Procedure: ESOPHAGEAL BANDING;  Surgeon: Marnee Sink, MD;  Location: [facility] ENDOSCOPY;  Service: Endoscopy;;   ESOPHAGEAL BANDING  12/24/2022   Procedure: ESOPHAGEAL BANDING;  Surgeon: Marnee Sink, MD;  Location: [facility] ENDOSCOPY;  Service:  Endoscopy;;   ESOPHAGEAL BANDING  05/13/2023   Procedure: ESOPHAGOSCOPY, WITH ESOPHAGEAL VARICES BAND LIGATION;  Surgeon: Marnee Sink, MD;  Location: [facility] ENDOSCOPY;  Service: Endoscopy;;   ESOPHAGOGASTRODUODENOSCOPY N/A 05/13/2023   Procedure: EGD (ESOPHAGOGASTRODUODENOSCOPY);  Surgeon: Marnee Sink, MD;  Location: [facility] ENDOSCOPY;  Service: Endoscopy;  Laterality: N/A;   ESOPHAGOGASTRODUODENOSCOPY (EGD) WITH PROPOFOL  N/A 03/11/2016   Procedure: ESOPHAGOGASTRODUODENOSCOPY (EGD) WITH PROPOFOL ;  Surgeon: Cassie Click, MD;  Location: [facility] ENDOSCOPY;  Service: Endoscopy;  Laterality: N/A;   ESOPHAGOGASTRODUODENOSCOPY (EGD) WITH PROPOFOL  N/A 02/18/2020   Procedure: ESOPHAGOGASTRODUODENOSCOPY (EGD) WITH PROPOFOL ;  Surgeon: Marnee Sink, MD;  Location: [facility] ENDOSCOPY;  Service: Endoscopy;  Laterality: N/A;   ESOPHAGOGASTRODUODENOSCOPY (EGD) WITH PROPOFOL  N/A 04/25/2020   Procedure: ESOPHAGOGASTRODUODENOSCOPY (EGD) WITH PROPOFOL ;  Surgeon: Marnee Sink, MD;  Location: [facility] ENDOSCOPY;  Service: Endoscopy;  Laterality: N/A;   ESOPHAGOGASTRODUODENOSCOPY (EGD) WITH PROPOFOL  N/A 05/23/2020   Procedure: ESOPHAGOGASTRODUODENOSCOPY (EGD) WITH PROPOFOL ;  Surgeon: Marnee Sink, MD;  Location: [facility] ENDOSCOPY;  Service: Endoscopy;  Laterality: N/A;   ESOPHAGOGASTRODUODENOSCOPY (EGD) WITH PROPOFOL  N/A 09/10/2022   Procedure: ESOPHAGOGASTRODUODENOSCOPY (EGD) WITH PROPOFOL ;  Surgeon: Luke Salaam, MD;  Location: [facility] ENDOSCOPY;  Service: Gastroenterology;  Laterality: N/A;   ESOPHAGOGASTRODUODENOSCOPY (EGD) WITH PROPOFOL  N/A 10/17/2022   Procedure: ESOPHAGOGASTRODUODENOSCOPY (  EGD) WITH PROPOFOL ;  Surgeon: Marnee Sink, MD;  Location: [facility] ENDOSCOPY;  Service: Endoscopy;  Laterality: N/A;   ESOPHAGOGASTRODUODENOSCOPY (EGD) WITH PROPOFOL  N/A 12/24/2022   Procedure: ESOPHAGOGASTRODUODENOSCOPY (EGD) WITH PROPOFOL ;  Surgeon: Marnee Sink, MD;  Location: [facility] ENDOSCOPY;  Service: Endoscopy;  Laterality: N/A;    ESOPHAGOGASTRODUODENOSCOPY (EGD) WITH PROPOFOL  N/A 01/29/2023   Procedure: ESOPHAGOGASTRODUODENOSCOPY (EGD) WITH PROPOFOL ;  Surgeon: Shane Darling, MD;  Location: [facility] ENDOSCOPY;  Service: Endoscopy;  Laterality: N/A;  Ideally we would intubate if possible given concern for varices   EUS N/A 10/04/2021   Procedure: LOWER ENDOSCOPIC ULTRASOUND (EUS);  Surgeon: Rayford Cake, MD;  Location: [facility] ENDOSCOPY;  Service: Gastroenterology;  Laterality: N/A;  [facility]   EYE SURGERY     FINGER ARTHROSCOPY WITH CARPOMETACARPEL ([facility]) ARTHROPLASTY Right 09/03/2018   Procedure: CARPOMETACARPEL [facility]) ARTHROPLASTY RIGHT THUMB;  Surgeon: Molli Angelucci, MD;  Location: [facility] ORS;  Service: Orthopedics;  Laterality: Right;   FLEXIBLE SIGMOIDOSCOPY N/A 09/14/2021   Procedure: FLEXIBLE SIGMOIDOSCOPY;  Surgeon: Marnee Sink, MD;  Location: [facility] ENDOSCOPY;  Service: Endoscopy;  Laterality: N/A;  No anesthesia   GANGLION CYST EXCISION Right 09/03/2018   Procedure: REMOVAL GANGLION OF WRIST;  Surgeon: Molli Angelucci, MD;  Location: [facility] ORS;  Service: Orthopedics;  Laterality: Right;   HARDWARE REMOVAL Right 09/03/2018   Procedure: HARDWARE REMOVAL RIGHT THUMB;  Surgeon: Molli Angelucci, MD;  Location: [facility] ORS;  Service: Orthopedics;  Laterality: Right;  staple removed   HEMOSTASIS CLIP PLACEMENT  09/10/2022   Procedure: HEMOSTASIS CLIP PLACEMENT;  Surgeon: Luke Salaam, MD;  Location: [facility] ENDOSCOPY;  Service: Gastroenterology;;   HEMOSTASIS CONTROL  09/10/2022   Procedure: HEMOSTASIS CONTROL;  Surgeon: Luke Salaam, MD;  Location: [facility] ENDOSCOPY;  Service: Gastroenterology;;   HEMOSTASIS CONTROL  01/29/2023   Procedure: HEMOSTASIS CONTROL;  Surgeon: Shane Darling, MD;  Location: [facility] ENDOSCOPY;  Service: Endoscopy;;   IR PARACENTESIS  06/09/2023   IR TIPS  06/09/2023   JOINT REPLACEMENT Left    TKR   left sinusplasty      MASTECTOMY, PARTIAL Left 10/17/2015   Procedure: MASTECTOMY PARTIAL REVISION;  Surgeon:  Benancio Bracket, MD;  Location: [facility] ORS;  Service: General;  Laterality: Left;   PACEMAKER IMPLANT N/A 03/23/2021   Procedure: PACEMAKER IMPLANT;  Surgeon: Boyce Byes, MD;  Location: [facility]AS[facility] CV LAB;  Service: Cardiovascular;  Laterality: N/A;   PARTIAL MASTECTOMY WITH NEEDLE LOCALIZATION Left 09/29/2015   Procedure: PARTIAL MASTECTOMY WITH NEEDLE LOCALIZATION;  Surgeon: Benancio Bracket, MD;  Location: [facility] ORS;  Service: General;  Laterality: Left;   POLYPECTOMY  09/10/2022   Procedure: POLYPECTOMY;  Surgeon: Luke Salaam, MD;  Location: [facility] ENDOSCOPY;  Service: Gastroenterology;;   RECTAL EXAM UNDER ANESTHESIA N/A 10/05/2021   Procedure: RECTAL EXAM UNDER ANESTHESIA, ASPIRATION OF RECTAL CYST;  Surgeon: Eldred Grego, MD;  Location: [facility] ORS;  Service: General;  Laterality: N/A;   TOTAL ABDOMINAL HYSTERECTOMY W/ BILATERAL SALPINGOOPHORECTOMY      FAMILY HISTORY :   Family History  Problem Relation Age of Onset   Breast cancer Paternal Grandmother    Colon cancer Father    Diabetes Sister    Diabetes Brother    Heart disease Brother    Prostate cancer Brother    Colon cancer Maternal Uncle    Prostate cancer Brother    Bladder Cancer Brother    Leukemia Mother        all   Ovarian cancer Neg Hx    Kidney cancer Neg  Hx     SOCIAL HISTORY:   Social History   Tobacco Use   Smoking status: Never   Smokeless tobacco: Never  Vaping Use   Vaping status: Never Used  Substance Use Topics   Alcohol use: Never   Drug use: Never    ALLERGIES:  is allergic to codeine, demeclocycline, demerol [meperidine], hydrocodone, morphine , other, oxycodone , pentazocine, tetracyclines & related, coal tar extract, hydrocodone-acetaminophen , salicylic acid, and tetracycline hcl.  MEDICATIONS:  Current Outpatient Medications  Medication Sig Dispense Refill   albuterol  (VENTOLIN  HFA) 108 (90 Base) MCG/ACT inhaler Inhale 2 puffs into the lungs every 4 (four) hours as  needed for wheezing or shortness of breath.     Cholecalciferol  (VITAMIN D ) 50 MCG (2000 UT) CAPS Take 2,000 Units by mouth daily.     clobetasol  ointment (TEMOVATE ) 0.05 % Apply 1 application topically as needed (vaginal irritation).     clotrimazole  (LOTRIMIN ) 1 % cream Apply 1 Application topically 2 (two) times daily as needed.     [employer] POTASSIUM GLUCONATE PO Take 650 mg by mouth daily.     cyanocobalamin (,VITAMIN B-12,) 1000 MCG/ML injection 1,000 mcg every 30 (thirty) days.     esomeprazole (NEXIUM) 40 MG capsule Take 40 mg by mouth 2 (two) times daily before a meal.      fenofibrate  160 MG tablet Take 160 mg by mouth at bedtime.     gabapentin  (NEURONTIN ) 300 MG capsule Take 300 mg by mouth 2 (two) times daily.     insulin  aspart (NOVOLOG ) 100 UNIT/ML FlexPen Inject 2 Units into the skin 3 (three) times daily with meals. If eating and Blood Glucose (BG) 80 or higher inject 2 units for meal coverage and add correction dose per scale. If not eating, correction dose only. BG <150= 0 unit; BG 150-200= 1 unit; BG 201-250= 2 unit; BG 251-300= 3 unit; BG 301-350= 4 unit; BG 351-400= 5 unit; BG >400= 6 unit and Call Primary care.     insulin  degludec (TRESIBA  FLEXTOUCH) 200 UNIT/ML FlexTouch Pen Inject 6 Units into the skin at bedtime.     ipratropium (ATROVENT) 0.03 % nasal spray Place 1 spray into both nostrils 2 (two) times daily as needed for rhinitis.     lactulose  (CHRONULAC ) 10 GM/15ML solution Take 45 mLs (30 g total) by mouth daily. 946 mL 0   levocetirizine (XYZAL) 5 MG tablet Take 5 mg by mouth every evening.     levothyroxine  (SYNTHROID , LEVOTHROID) 75 MCG tablet Take 75 mcg by mouth daily before breakfast.      magnesium  oxide (MAG-OX) 400 MG tablet Take 400 mg by mouth 2 (two) times daily.      midodrine  (PROAMATINE ) 10 MG tablet Take 1 tablet (10 mg total) by mouth 3 (three) times daily with meals. 90 tablet 0   nadolol  (CORGARD ) 20 MG tablet Take 1 tablet (20 mg total) by mouth daily.  90 tablet 3   simvastatin  (ZOCOR ) 20 MG tablet Take 20 mg by mouth at bedtime.     spironolactone  (ALDACTONE ) 50 MG tablet Take 1.5 tablets (75 mg total) by mouth daily. 135 tablet 3   tirzepatide  (MOUNJARO ) 15 MG/0.5ML Pen Inject 15 mg into the skin once a week. On Fridays     torsemide  (DEMADEX ) 20 MG tablet Take 3 tablets (60 mg total) by mouth daily. 270 tablet 3   traMADol  (ULTRAM ) 50 MG tablet Take 1 tablet (50 mg total) by mouth every 8 (eight) hours as needed. (Patient taking differently: Take  50 mg by mouth every 8 (eight) hours as needed for moderate pain (pain score 4-6).) 60 tablet 0   valACYclovir  (VALTREX ) 500 MG tablet Take 500 mg by mouth 2 (two) times daily as needed.     ondansetron  (ZOFRAN ) 4 MG tablet Take 1 tablet (4 mg total) by mouth every 8 (eight) hours as needed for nausea. 90 tablet 1   ondansetron  (ZOFRAN ) 8 MG tablet Take 1 tablet (8 mg total) by mouth every 8 (eight) hours as needed for nausea or vomiting. 90 tablet 1   No current facility-administered medications for this visit.    PHYSICAL EXAMINATION: ECOG PERFORMANCE STATUS: 1 - Symptomatic but completely ambulatory  BP (!) 122/58 (BP Location: Right Leg, Patient Position: Sitting, Cuff Size: Normal)   Pulse 70   Temp (!) 96.9 F (36.1 C) (Tympanic)   Resp 18   Ht 5\' 3"  (1.6 m)   Wt 129 lb 12.8 oz (58.9 kg)   LMP  (LMP Unknown)   SpO2 100%   BMI 22.99 kg/m   Filed Weights   07/14/23 1417  Weight: 129 lb 12.8 oz (58.9 kg)    Physical Exam Constitutional:      Comments: Alone.  Ambulating independently.  HENT:     Head: Normocephalic and atraumatic.     Mouth/Throat:     Pharynx: No oropharyngeal exudate.  Eyes:     Pupils: Pupils are equal, round, and reactive to light.  Cardiovascular:     Rate and Rhythm: Tachycardia present. Rhythm irregular.     Heart sounds: Murmur heard.  Pulmonary:     Effort: Pulmonary effort is normal. No respiratory distress.     Breath sounds: Normal breath  sounds. No wheezing.  Abdominal:     General: Bowel sounds are normal. There is no distension.     Palpations: Abdomen is soft. There is no mass.     Tenderness: There is no abdominal tenderness. There is no guarding or rebound.     Comments: Positive for splenomegaly.  Musculoskeletal:        General: No tenderness. Normal range of motion.     Cervical back: Normal range of motion and neck supple.  Skin:    General: Skin is warm.  Neurological:     Mental Status: She is alert and oriented to person, place, and time.  Psychiatric:        Mood and Affect: Affect normal.    Positive for abdominal distension  LABORATORY DATA:  I have reviewed the data as listed    Component Value Date/Time   NA 133 (L) 07/14/2023 1418   NA 142 06/26/2023 1023   K 4.0 07/14/2023 1418   CL 103 07/14/2023 1418   CO2 22 07/14/2023 1418   GLUCOSE 103 (H) 07/14/2023 1418   BUN 15 07/14/2023 1418   BUN 13 06/26/2023 1023   CREATININE 0.87 07/14/2023 1418   CALCIUM 8.6 (L) 07/14/2023 1418   PROT 7.6 07/14/2023 1418   PROT 7.1 11/30/2020 0944   ALBUMIN  3.3 (L) 07/14/2023 1418   ALBUMIN  3.8 11/30/2020 0944   AST 48 (H) 07/14/2023 1418   ALT 21 07/14/2023 1418   ALKPHOS 87 07/14/2023 1418   BILITOT 2.9 (H) 07/14/2023 1418   GFRNONAA >60 07/14/2023 1418   GFRAA 80 02/25/2020 1105    No results found for: "SPEP", "UPEP"  Lab Results  Component Value Date   WBC 4.6 07/14/2023   NEUTROABS 2.2 07/14/2023   HGB 11.9 (L) 07/14/2023  HCT 34.5 (L) 07/14/2023   MCV 92.5 07/14/2023   PLT 107 (L) 07/14/2023      Chemistry      Component Value Date/Time   NA 133 (L) 07/14/2023 1418   NA 142 06/26/2023 1023   K 4.0 07/14/2023 1418   CL 103 07/14/2023 1418   CO2 22 07/14/2023 1418   BUN 15 07/14/2023 1418   BUN 13 06/26/2023 1023   CREATININE 0.87 07/14/2023 1418      Component Value Date/Time   CALCIUM 8.6 (L) 07/14/2023 1418   ALKPHOS 87 07/14/2023 1418   AST 48 (H) 07/14/2023 1418    ALT 21 07/14/2023 1418   BILITOT 2.9 (H) 07/14/2023 1418       RADIOGRAPHIC STUDIES: I have personally reviewed the radiological images as listed and agreed with the findings in the report. No results found.   ASSESSMENT & PLAN:  Pancytopenia (HCC) # Anemia-secondary GI bleed chronic/liver disease.  Hemoglobin-11.8- [Jan 2025- UGIB][Recent Upper GIB]; cannot tolerate PO iron .   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  ANC-1000;  Platelets 107- Today proceed venofer -as hemoglobin is 7.8.  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  stable.     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 BIL Mammogram JAN 2023 [Dr.Cintron]-within normal limits.  Not on AI.  Stable.   # DISPOSITION:   # proceed Venofer  today # follow up in  3  month- MD; labs- cbc/cmp;Hold tube.]; iron  studies; ferritin-; possible Venofer -Dr.B  Cc; Dr.Tate     Orders Placed This Encounter  Procedures   CBC with Differential (Cancer Center Only)    Standing Status:   Future    Expected Date:   10/14/2023    Expiration Date:   07/13/2024   CMP (Cancer Center only)    Standing Status:   Future    Expected Date:   10/14/2023    Expiration Date:   07/13/2024   Iron  and TIBC    Standing Status:   Future    Expected Date:   10/14/2023    Expiration Date:   07/13/2024   Ferritin    Standing Status:   Future    Expected Date:   10/14/2023    Expiration Date:   07/13/2024   Sample to Blood Bank    Standing Status:   Future    Expected Date:   10/14/2023    Expiration Date:   07/13/2024   All questions were answered. The patient knows to call the clinic with any problems,  questions or concerns.      Gwyn Leos, MD 07/14/2023 4:06 PM

## 2023-07-14 NOTE — Progress Notes (Signed)
 Fatigue/Weakness: YES Abnormal bleeding, bruising: NO Bone pain:  NO Headache: NO Dizziness:  NO  TIPS procedure [facility] 06/09/23. C/O SOB since procedure.  Fell 06/21/23, lesion lt leg. Not sure why she fell.  Having nausea with the lactulose . Using zofran , needs refills both 4mg  and 8mg , pended.

## 2023-07-17 ENCOUNTER — Other Ambulatory Visit

## 2023-07-17 ENCOUNTER — Other Ambulatory Visit
Admission: RE | Admit: 2023-07-17 | Discharge: 2023-07-17 | Disposition: A | Discharge status: Home / Self Care | Attending: Interventional Radiology | Admitting: Interventional Radiology

## 2023-07-17 DIAGNOSIS — K7581 Nonalcoholic steatohepatitis (NASH): Secondary | ICD-10-CM | POA: Diagnosis present

## 2023-07-17 DIAGNOSIS — D61818 Other pancytopenia: Secondary | ICD-10-CM | POA: Diagnosis not present

## 2023-07-17 LAB — CBC WITH DIFFERENTIAL/PLATELET
Abs Immature Granulocytes: 0.01 10*3/uL (ref 0.00–0.07)
Basophils Absolute: 0 10*3/uL (ref 0.0–0.1)
Basophils Relative: 1 %
Eosinophils Absolute: 0.2 10*3/uL (ref 0.0–0.5)
Eosinophils Relative: 4 %
HCT: 34.1 % — ABNORMAL LOW (ref 36.0–46.0)
Hemoglobin: 11.4 g/dL — ABNORMAL LOW (ref 12.0–15.0)
Immature Granulocytes: 0 %
Lymphocytes Relative: 37 %
Lymphs Abs: 1.5 10*3/uL (ref 0.7–4.0)
MCH: 32.5 pg (ref 26.0–34.0)
MCHC: 33.4 g/dL (ref 30.0–36.0)
MCV: 97.2 fL (ref 80.0–100.0)
Monocytes Absolute: 0.4 10*3/uL (ref 0.1–1.0)
Monocytes Relative: 9 %
Neutro Abs: 1.9 10*3/uL (ref 1.7–7.7)
Neutrophils Relative %: 49 %
Platelets: 103 10*3/uL — ABNORMAL LOW (ref 150–400)
RBC: 3.51 MIL/uL — ABNORMAL LOW (ref 3.87–5.11)
RDW: 16.6 % — ABNORMAL HIGH (ref 11.5–15.5)
WBC: 3.9 10*3/uL — ABNORMAL LOW (ref 4.0–10.5)
nRBC: 0 % (ref 0.0–0.2)

## 2023-07-17 LAB — COMPREHENSIVE METABOLIC PANEL WITH GFR
ALT: 22 U/L (ref 0–44)
AST: 51 U/L — ABNORMAL HIGH (ref 15–41)
Albumin: 3.3 g/dL — ABNORMAL LOW (ref 3.5–5.0)
Alkaline Phosphatase: 70 U/L (ref 38–126)
Anion gap: 8 (ref 5–15)
BUN: 13 mg/dL (ref 8–23)
CO2: 22 mmol/L (ref 22–32)
Calcium: 8.9 mg/dL (ref 8.9–10.3)
Chloride: 105 mmol/L (ref 98–111)
Creatinine, Ser: 0.9 mg/dL (ref 0.44–1.00)
GFR, Estimated: >60 mL/min (ref >60)
Glucose, Bld: 127 mg/dL — ABNORMAL HIGH (ref 70–99)
Potassium: 3.9 mmol/L (ref 3.5–5.1)
Sodium: 135 mmol/L (ref 135–145)
Total Bilirubin: 2.6 mg/dL — ABNORMAL HIGH (ref 0.0–1.2)
Total Protein: 7.2 g/dL (ref 6.5–8.1)

## 2023-07-17 LAB — PROTIME-INR
INR: 1.2 (ref 0.8–1.2)
Prothrombin Time: 15.8 s — ABNORMAL HIGH (ref 11.4–15.2)

## 2023-07-22 ENCOUNTER — Encounter: Payer: Self-pay | Admitting: Internal Medicine

## 2023-07-22 ENCOUNTER — Ambulatory Visit
Admission: RE | Admit: 2023-07-22 | Discharge: 2023-07-22 | Disposition: A | Discharge status: Home / Self Care | Source: Ambulatory Visit | Attending: Radiology

## 2023-07-22 DIAGNOSIS — Z95828 Presence of other vascular implants and grafts: Secondary | ICD-10-CM

## 2023-07-22 HISTORY — PX: IR RADIOLOGIST EVAL & MGMT: IMG5224

## 2023-07-22 NOTE — Progress Notes (Signed)
 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 She also has a history of esophageal varices status post prior banding procedures. She underwent TIPS creation at [facility] on 06/09/23.  She did well and was discharged home but had an episode of hepatic encephalopathy a few days later requiring re-admission.    She is now home and doing better.  She does complain of persistent fatigue, nausea, and decreased appetite. She is staying with her daughter.  Her daughter is with her today.   No recurrent ascites, she has not required paracentesis since the TIPS.  TIPS duplex US  looks good.  Liver labs look decent, MELD is 15.   Past Medical History:  Diagnosis Date   Abdominal pain    Allergy    Cancer (HCC) 2017   breast cancer- Left   Cataract    CHF (congestive heart failure) (HCC)    Collagenous colitis    Coronary artery disease    Diabetes mellitus without complication (HCC)    Diarrhea    Diverticulosis    Dysrhythmia    Fatty liver disease, nonalcoholic    Fibrocystic breast    GERD (gastroesophageal reflux disease)    Heart murmur    Hyperlipidemia    Hypertension    Hypothyroidism    IBS (irritable bowel syndrome)    IDA (iron  deficiency anemia)    Liver cirrhosis (HCC)    Personal history of radiation therapy 2017   LEFT BREAST CA   Presence of permanent cardiac pacemaker    Sleep apnea    C-Pap    Past Surgical History:  Procedure Laterality Date   ABDOMINAL HYSTERECTOMY     tah bso   ABDOMINAL SURGERY     APPENDECTOMY     AV NODE ABLATION N/A 05/17/2021   Procedure: AV NODE ABLATION;  Surgeon: Boyce Byes, MD;   [facility];  Service: Cardiovascular;  Laterality: N/A;   BREAST BIOPSY Bilateral 2016   negative   BREAST 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/2015   DCIS, papillary carcinoma in situ   BREAST BIOPSY Left 05/27/2016   BENIGN MAMMARY EPITHELIUM   BREAST BIOPSY Left 11/20/2017   affirm bx x clip BENIGN MAMMARY EPITHELIUM CONSISTENT WITH RAD THERAPY   BREAST EXCISIONAL BIOPSY Right    NEG 1980's   BREAST LUMPECTOMY Left 10/17/2015   DCIS and papillary carcinoma insitu, clear margins   CARDIAC SURGERY     has replacement valve   CATARACT EXTRACTION Right    CATARACT EXTRACTION W/PHACO Left 01/16/2021   Procedure: CATARACT EXTRACTION PHACO AND INTRAOCULAR LENS PLACEMENT (IOC) LEFT DIABETIC 8.46 00:59.8;  Surgeon: Clair Crews, MD;  Location: [facility];  Service: Ophthalmology;  Laterality: Left;   CHOLECYSTECTOMY     COLONOSCOPY WITH PROPOFOL  N/A 03/11/2016   Procedure: COLONOSCOPY WITH PROPOFOL ;  Surgeon: Cassie Click, MD;  Location: [facility] ENDOSCOPY;  Service: Endoscopy;  Laterality: N/A;   COLONOSCOPY WITH PROPOFOL  N/A 02/18/2020   Procedure: COLONOSCOPY WITH PROPOFOL ;  Surgeon: Marnee Sink, MD;  Location: [facility] ENDOSCOPY;  Service: Endoscopy;  Laterality: N/A;   COLONOSCOPY WITH PROPOFOL  N/A 09/10/2022   Procedure: COLONOSCOPY WITH PROPOFOL ;  Surgeon: Luke Salaam, MD;  Location: [facility] ENDOSCOPY;  Service: Gastroenterology;  Laterality: N/A;   ESOPHAGEAL BANDING  09/10/2022   Procedure: ESOPHAGEAL BANDING;  Surgeon: Luke Salaam, MD;  Location: [facility] ENDOSCOPY;  Service: Gastroenterology;;   ESOPHAGEAL BANDING  10/17/2022   Procedure: ESOPHAGEAL BANDING;  Surgeon: Marnee Sink, MD;  Location: [facility] ENDOSCOPY;  Service: Endoscopy;;   ESOPHAGEAL BANDING  12/24/2022   Procedure: ESOPHAGEAL BANDING;  Surgeon: Marnee Sink, MD;  Location: [facility] ENDOSCOPY;  Service: Endoscopy;;   ESOPHAGEAL BANDING  05/13/2023   Procedure: ESOPHAGOSCOPY, WITH ESOPHAGEAL VARICES BAND LIGATION;  Surgeon:  Marnee Sink, MD;  Location: [facility] ENDOSCOPY;  Service: Endoscopy;;   ESOPHAGOGASTRODUODENOSCOPY N/A 05/13/2023   Procedure: EGD (ESOPHAGOGASTRODUODENOSCOPY);  Surgeon: Marnee Sink, MD;  Location: [facility] ENDOSCOPY;  Service: Endoscopy;  Laterality: N/A;   ESOPHAGOGASTRODUODENOSCOPY (EGD) WITH PROPOFOL  N/A 03/11/2016   Procedure: ESOPHAGOGASTRODUODENOSCOPY (EGD) WITH PROPOFOL ;  Surgeon: Cassie Click, MD;  Location: [facility] ENDOSCOPY;  Service: Endoscopy;  Laterality: N/A;   ESOPHAGOGASTRODUODENOSCOPY (EGD) WITH PROPOFOL  N/A 02/18/2020   Procedure: ESOPHAGOGASTRODUODENOSCOPY (EGD) WITH PROPOFOL ;  Surgeon: Marnee Sink, MD;  Location: [facility] ENDOSCOPY;  Service: Endoscopy;  Laterality: N/A;   ESOPHAGOGASTRODUODENOSCOPY (EGD) WITH PROPOFOL  N/A 04/25/2020   Procedure: ESOPHAGOGASTRODUODENOSCOPY (EGD) WITH PROPOFOL ;  Surgeon: Marnee Sink, MD;  Location: [facility] ENDOSCOPY;  Service: Endoscopy;  Laterality: N/A;   ESOPHAGOGASTRODUODENOSCOPY (EGD) WITH PROPOFOL  N/A 05/23/2020   Procedure: ESOPHAGOGASTRODUODENOSCOPY (EGD) WITH PROPOFOL ;  Surgeon: Marnee Sink, MD;  Location: [facility] ENDOSCOPY;  Service: Endoscopy;  Laterality: N/A;   ESOPHAGOGASTRODUODENOSCOPY (EGD) WITH PROPOFOL  N/A 09/10/2022   Procedure: ESOPHAGOGASTRODUODENOSCOPY (EGD) WITH PROPOFOL ;  Surgeon: Luke Salaam, MD;  Location: [facility] ENDOSCOPY;  Service: Gastroenterology;  Laterality: N/A;   ESOPHAGOGASTRODUODENOSCOPY (EGD) WITH PROPOFOL  N/A 10/17/2022   Procedure: ESOPHAGOGASTRODUODENOSCOPY (EGD) WITH PROPOFOL ;  Surgeon: Marnee Sink, MD;  Location: [facility] ENDOSCOPY;  Service: Endoscopy;  Laterality: N/A;   ESOPHAGOGASTRODUODENOSCOPY (EGD) WITH PROPOFOL  N/A 12/24/2022   Procedure: ESOPHAGOGASTRODUODENOSCOPY (EGD) WITH PROPOFOL ;  Surgeon: Marnee Sink, MD;  Location: [facility] ENDOSCOPY;  Service: Endoscopy;  Laterality: N/A;   ESOPHAGOGASTRODUODENOSCOPY (EGD) WITH PROPOFOL  N/A 01/29/2023   Procedure: ESOPHAGOGASTRODUODENOSCOPY (EGD) WITH PROPOFOL ;  Surgeon:  Shane Darling, MD;  Location: [facility] ENDOSCOPY;  Service: Endoscopy;  Laterality: N/A;  Ideally we would intubate if possible given concern for varices   EUS N/A 10/04/2021   Procedure: LOWER ENDOSCOPIC ULTRASOUND (EUS);  Surgeon: Rayford Cake, MD;  Location: [facility] ENDOSCOPY;  Service: Gastroenterology;  Laterality: N/A;  [facility]   EYE SURGERY     FINGER ARTHROSCOPY WITH CARPOMETACARPEL ([facility]) ARTHROPLASTY Right 09/03/2018   Procedure: CARPOMETACARPEL [facility]) ARTHROPLASTY RIGHT THUMB;  Surgeon: Molli Angelucci, MD;  Location: [facility] ORS;  Service: Orthopedics;  Laterality: Right;   FLEXIBLE SIGMOIDOSCOPY N/A 09/14/2021   Procedure: FLEXIBLE SIGMOIDOSCOPY;  Surgeon: Marnee Sink, MD;  Location: [facility] ENDOSCOPY;  Service: Endoscopy;  Laterality: N/A;  No anesthesia   GANGLION CYST EXCISION Right 09/03/2018   Procedure: REMOVAL GANGLION OF WRIST;  Surgeon: Molli Angelucci, MD;  Location: [facility] ORS;  Service: Orthopedics;  Laterality: Right;   HARDWARE REMOVAL Right 09/03/2018   Procedure: HARDWARE REMOVAL RIGHT THUMB;  Surgeon: Molli Angelucci, MD;  Location: [facility] ORS;  Service: Orthopedics;  Laterality: Right;  staple removed   HEMOSTASIS CLIP PLACEMENT  09/10/2022   Procedure: HEMOSTASIS CLIP PLACEMENT;  Surgeon: Luke Salaam, MD;  Location: [facility] ENDOSCOPY;  Service: Gastroenterology;;   HEMOSTASIS CONTROL  09/10/2022   Procedure: HEMOSTASIS CONTROL;  Surgeon: Luke Salaam, MD;  Location: [facility] ENDOSCOPY;  Service: Gastroenterology;;   HEMOSTASIS CONTROL  01/29/2023   Procedure: HEMOSTASIS CONTROL;  Surgeon: Shane Darling, MD;  Location: [facility] ENDOSCOPY;  Service: Endoscopy;;   IR PARACENTESIS  06/09/2023   IR RADIOLOGIST EVAL & MGMT  07/22/2023   IR TIPS  06/09/2023   JOINT REPLACEMENT Left    TKR   left sinusplasty      MASTECTOMY, PARTIAL Left 10/17/2015   Procedure: MASTECTOMY PARTIAL REVISION;  Surgeon: Benancio Bracket, MD;  Location: [facility] ORS;  Service: General;  Laterality: Left;    PACEMAKER IMPLANT N/A 03/23/2021   Procedure: PACEMAKER IMPLANT;  Surgeon: Boyce Byes, MD;  [facility];  Service: Cardiovascular;  Laterality: N/A;   PARTIAL MASTECTOMY WITH NEEDLE LOCALIZATION Left 09/29/2015   Procedure: PARTIAL MASTECTOMY WITH NEEDLE LOCALIZATION;  Surgeon: Benancio Bracket, MD;  Location: [facility] ORS;  Service: General;  Laterality: Left;   POLYPECTOMY  09/10/2022   Procedure: POLYPECTOMY;  Surgeon: Luke Salaam, MD;  Location: [facility] ENDOSCOPY;  Service: Gastroenterology;;   RECTAL EXAM UNDER ANESTHESIA N/A 10/05/2021   Procedure: RECTAL EXAM UNDER ANESTHESIA, ASPIRATION OF RECTAL CYST;  Surgeon: Eldred Grego, MD;  Location: [facility] ORS;  Service: General;  Laterality: N/A;   TOTAL ABDOMINAL HYSTERECTOMY W/ BILATERAL SALPINGOOPHORECTOMY      Allergies: Codeine, Demeclocycline, Demerol [meperidine], Hydrocodone, Morphine , Other, Oxycodone , Pentazocine, Tetracyclines & related, Coal tar extract, Hydrocodone-acetaminophen , Salicylic acid, and Tetracycline hcl  Medications: Prior to Admission medications   Medication Sig Start Date End Date Taking? Authorizing Provider  Cholecalciferol  (VITAMIN D ) 50 MCG (2000 UT) CAPS Take 2,000 Units by mouth daily.   Yes [provider]  clobetasol  ointment (TEMOVATE ) 0.05 % Apply 1 application topically as needed (vaginal irritation).   Yes [provider]  clotrimazole  (LOTRIMIN ) 1 % cream Apply 1 Application topically 2 (two) times daily as needed.   Yes [provider]  [facility] POTASSIUM GLUCONATE PO Take 650 mg by mouth daily.   Yes [provider]  cyanocobalamin (,VITAMIN B-12,) 1000 MCG/ML injection 1,000 mcg every 30 (thirty) days. 04/23/19  Yes [provider]  esomeprazole (NEXIUM) 40 MG capsule Take 40 mg by mouth 2 (two) times daily before a meal.  02/08/16  Yes [provider]  fenofibrate  160 MG tablet Take 160 mg by mouth at bedtime.   Yes  [provider]  gabapentin  (NEURONTIN ) 300 MG capsule Take 300 mg by mouth 2 (two) times daily. 09/07/16 10/25/36 Yes Westley Hammers, MD  insulin  aspart (NOVOLOG ) 100 UNIT/ML FlexPen Inject 2 Units into the skin 3 (three) times daily with meals. If eating and Blood Glucose (BG) 80 or higher inject 2 units for meal coverage and add correction dose per scale. If not eating, correction dose only. BG <150= 0 unit; BG 150-200= 1 unit; BG 201-250= 2 unit; BG 251-300= 3 unit; BG 301-350= 4 unit; BG 351-400= 5 unit; BG >400= 6 unit and Call Primary care. 06/18/23  Yes Wieting, Richard, MD  insulin  degludec (TRESIBA  FLEXTOUCH) 200 UNIT/ML FlexTouch Pen Inject 6 Units into the skin at bedtime. 06/18/23  Yes Wieting, Richard, MD  lactulose  (CHRONULAC ) 10 GM/15ML solution Take 45 mLs (30 g total) by mouth daily. 06/18/23  Yes Wieting, Richard, MD  levocetirizine (XYZAL) 5 MG tablet Take 5 mg by mouth every evening.   Yes [provider]  levothyroxine  (SYNTHROID , LEVOTHROID) 75 MCG tablet Take 50 mcg by mouth daily before breakfast.   Yes [provider]  magnesium  oxide (MAG-OX) 400 MG tablet Take 400 mg by mouth 2 (two) times daily.    Yes  [provider]  midodrine  (PROAMATINE ) 10 MG tablet Take 1 tablet (10 mg total) by mouth 3 (three) times daily with meals. 06/18/23  Yes Wieting, Richard, MD  nadolol  (CORGARD ) 20 MG tablet Take 1 tablet (20 mg total) by mouth daily. 12/20/22  Yes End, Veryl Gottron, MD  ondansetron  (ZOFRAN ) 4 MG tablet Take 1 tablet (4 mg total) by mouth every 8 (eight) hours as needed for nausea. 07/14/23  Yes Brahmanday, Govinda R, MD  ondansetron  (ZOFRAN ) 8 MG tablet Take 1 tablet (8 mg total) by mouth every 8 (eight) hours as needed for nausea or vomiting. 07/14/23  Yes Brahmanday, Govinda R, MD  simvastatin  (ZOCOR ) 20 MG tablet Take 20 mg by mouth at bedtime.   Yes [provider]  spironolactone  (ALDACTONE ) 50 MG tablet Take 1.5 tablets (75 mg total) by mouth  daily. 06/27/23  Yes End, Veryl Gottron, MD  tirzepatide  (MOUNJARO ) 15 MG/0.5ML Pen Inject 15 mg into the skin once a week. On Fridays   Yes [provider]  torsemide  (DEMADEX ) 20 MG tablet Take 3 tablets (60 mg total) by mouth daily. 06/26/23  Yes End, Veryl Gottron, MD  traMADol  (ULTRAM ) 50 MG tablet Take 1 tablet (50 mg total) by mouth every 8 (eight) hours as needed. Patient taking differently: Take 50 mg by mouth every 8 (eight) hours as needed for moderate pain (pain score 4-6). 06/04/23  Yes Brahmanday, Govinda R, MD  valACYclovir  (VALTREX ) 500 MG tablet Take 500 mg by mouth 2 (two) times daily as needed. 02/10/23  Yes [provider]  albuterol  (VENTOLIN  HFA) 108 (90 Base) MCG/ACT inhaler Inhale 2 puffs into the lungs every 4 (four) hours as needed for wheezing or shortness of breath. Patient not taking: Reported on 07/22/2023 04/30/23 04/29/24  [provider]  ipratropium (ATROVENT) 0.03 % nasal spray Place 1 spray into both nostrils 2 (two) times daily as needed for rhinitis. Patient not taking: Reported on 07/22/2023 04/15/23 04/14/24  [provider]     Family History  Problem Relation Age of Onset   Breast cancer Paternal Grandmother    Colon cancer Father    Diabetes Sister    Diabetes Brother    Heart disease Brother    Prostate cancer Brother    Colon cancer Maternal Uncle    Prostate cancer Brother    Bladder Cancer Brother    Leukemia Mother        all   Ovarian cancer Neg Hx    Kidney cancer Neg Hx     Social History   Socioeconomic History   Marital status: Married    Spouse name: Richardean Chancellor   Number of children: Not on file   Years of education: Not on file   Highest education level: Not on file  Occupational History   Not on file  Tobacco Use   Smoking status: Never   Smokeless tobacco: Never  Vaping Use   Vaping status: Never Used  Substance and Sexual Activity   Alcohol use: Never   Drug use: Never   Sexual activity: Not  Currently    Birth control/protection: Surgical  Other Topics Concern   Not on file  Social History Narrative    Husband lives at nursing home , lives with husbands sister   Social Drivers of Health   Financial Resource Strain: Medium Risk (07/17/2023)   Received from [facility]   Overall Financial Resource Strain (CARDIA)    Difficulty of Paying Living Expenses: Somewhat hard  Food Insecurity: No  Food Insecurity (07/17/2023)   Received from [facility]   Hunger Vital Sign    Worried About Running Out of Food in the Last Year: Never true    Ran Out of Food in the Last Year: Never true  Transportation Needs: No Transportation Needs (07/17/2023)   Received from [facility] - Transportation    In the past 12 months, has lack of transportation kept you from medical appointments or from getting medications?: No    Lack of Transportation (Non-Medical): No  Physical Activity: Unknown (01/30/2018)   [facility]   Exercise Vital Sign    Days of Exercise per Week: 7 days    Minutes of Exercise per Session: Not on file  Stress: Not on file  Social Connections: Moderately Isolated (06/29/2023)   Social Connection and Isolation Panel [NHANES]    Frequency of Communication with Friends and Family: Once a week    Frequency of Social Gatherings with Friends and Family: Once a week    Attends Religious Services: More than 4 times per year    Active Member of [employer] or Organizations: No    Attends Banker Meetings: Never    Marital Status: Married   Review of Systems: A 12 point ROS discussed and pertinent positives are indicated in the HPI above.  All other systems are negative.  Review of Systems  Vital Signs: BP (!) 109/56 (BP Location: Right Arm, Patient Position: Sitting, Cuff Size: Normal)   Pulse 69   Temp 97.9 F (36.6 C)   Resp 18   LMP  (LMP Unknown)   SpO2 100%    Advance Care Plan: The advanced care plan/surrogate decision maker was discussed at the time of visit and the patient did not wish to discuss or was not able to name a surrogate decision maker or provide an advance care plan.    Physical Exam Constitutional:      General: She is not in acute distress.    Appearance: She is not ill-appearing.  HENT:     Head: Normocephalic and atraumatic.  Eyes:     General: No scleral icterus. Cardiovascular:     Rate and Rhythm: Normal rate.  Pulmonary:     Effort: Pulmonary effort is normal.  Abdominal:     General: There is no distension.     Palpations: Abdomen is soft.     Tenderness: There is no abdominal tenderness. There is no guarding.  Musculoskeletal:        General: No swelling.  Skin:    General: Skin is warm and dry.  Neurological:     Mental Status: She is alert and oriented to person, place, and time.     Comments: No asterixis  Psychiatric:        Behavior: Behavior normal.       Imaging: IR Radiologist Eval & Mgmt Result Date: 07/22/2023 EXAM: ESTABLISHED PATIENT OFFICE VISIT CHIEF COMPLAINT: SEE NOTE IN EPIC HISTORY OF PRESENT ILLNESS: SEE NOTE IN EPIC REVIEW OF SYSTEMS: SEE NOTE IN EPIC PHYSICAL EXAMINATION: SEE NOTE IN EPIC ASSESSMENT AND PLAN: SEE NOTE IN EPIC Electronically Signed   By: Fernando Hoyer M.D.   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 She underwent tips creation on 06/09/2023. EXAM: DUPLEX ULTRASOUND OF LIVER AND TIPS SHUNT TECHNIQUE: Color  and duplex Doppler ultrasound was performed to evaluate the hepatic in-flow and out-flow vessels. COMPARISON:  None Available. FINDINGS: Portal Vein Velocities Main:  70 cm/sec Right:  29 cm/sec Left:  12 cm/sec TIPS Stent Velocities Proximal:  116 cm/sec Mid:  183 Distal:  137 cm/sec IVC: Present and patent with  normal respiratory phasicity. Hepatic Vein Velocities Right:  38 cm/sec Mid:  137 cm/sec Left:  28 cm/sec Splenic Vein: 72 cm per second Superior Mesenteric Vein: 71 cm/second Hepatic Artery: 173 cm/second Ascites: Absent Varices: Absent Other findings: Heterogeneous and coarsened hepatic parenchyma with a nodular contour consistent with known underlying history of hepatic cirrhosis. IMPRESSION: 1. Patent middle hepatic to right portal venous TIPS without evidence of in stent stenosis or occlusion. 2. No evidence of recurrent ascites. 3. Hepatic cirrhosis. Electronically Signed   By: Fernando Hoyer M.D.   On: 07/17/2023 09:31   DG Chest Portable 1 View Result Date: 06/28/2023 CLINICAL DATA:  Confusion and cough. EXAM: PORTABLE CHEST 1 VIEW COMPARISON:  Chest x-ray 05/30/2023.  Chest CT 04/22/2023. FINDINGS: There are multifocal patchy opacities in the left upper lobe and left lower lung. Left lower lung airspace disease has increased. There is no pleural effusion or pneumothorax. The heart is enlarged, unchanged. Right-sided pacemaker is again seen. No acute fractures are identified. IMPRESSION: Multifocal patchy opacities in the left upper lobe and left lower lung, increased in the left lower lung. Findings are concerning for multifocal pneumonia. Follow-up chest x-ray in 4-6 weeks is recommended to ensure resolution. Electronically Signed   By: Tyron Gallon M.D.   On: 06/28/2023 21:02   CT Head Wo Contrast Result Date: 06/28/2023 CLINICAL DATA:  Mental status change, unknown cause EXAM: CT HEAD WITHOUT CONTRAST TECHNIQUE: Contiguous axial images were obtained from the base of the skull through the vertex without intravenous contrast. RADIATION DOSE REDUCTION: This exam was performed according to the departmental dose-optimization program which includes automated exposure control, adjustment of the mA and/or kV according to patient size and/or use of iterative reconstruction technique. COMPARISON:  CT  head 09/11/2022. FINDINGS: Brain: No evidence of acute infarction, hemorrhage, hydrocephalus, extra-axial collection or mass lesion/mass effect. Vascular: No hyperdense vessel. Skull: No acute fracture. Sinuses/Orbits: Paranasal sinus mucosal thickening. No acute orbital findings. Other: No mastoid effusions. IMPRESSION: 1. No evidence of acute intracranial abnormality. 2. Paranasal sinus mucosal thickening. Electronically Signed   By: Stevenson Elbe M.D.   On: 06/28/2023 19:51    Labs:  CBC: Recent Labs    06/29/23 0257 07/01/23 0600 07/14/23 1418 07/17/23 1146  WBC 3.2* 3.3* 4.6 3.9*  HGB 10.7* 10.1* 11.9* 11.4*  HCT 31.6* 29.0* 34.5* 34.1*  PLT 88* 70* 107* 103*    COAGS: Recent Labs    01/29/23 0438 05/21/23 1555 05/30/23 1809 06/09/23 0500 06/10/23 0455 06/29/23 0259 07/17/23 1146  INR 1.5* 1.3* 1.3* 1.4* 1.6* 1.5* 1.2  APTT 37* 33 32  --   --  38*  --     BMP: Recent Labs    06/30/23 0329 07/01/23 0600 07/14/23 1418 07/17/23 1146  NA 140 140 133* 135  K 2.8* 3.8 4.0 3.9  CL 111 113* 103 105  CO2 23 21* 22 22  GLUCOSE 91 102* 103* 127*  BUN 13 11 15 13   CALCIUM 8.2* 8.3* 8.6* 8.9  CREATININE 0.68 0.50 0.87 0.90  GFRNONAA >60 >60 >60 >60    LIVER FUNCTION TESTS: Recent Labs    06/29/23 0257 06/30/23 0329 07/14/23 1418 07/17/23 1146  BILITOT  3.4* 2.5* 2.9* 2.6*  AST 48* 39 48* 51*  ALT 26 18 21 22   ALKPHOS 51 48 87 70  PROT 6.3* 5.6* 7.6 7.2  ALBUMIN  3.0* 2.5* 3.3* 3.3*    TUMOR MARKERS: No results for input(s): "AFPTM", "CEA", "CA199", "CHROMGRNA" in the last 8760 hours.  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 She also has a history of esophageal varices status post prior banding procedures now status post TIPS creation on 06/09/23.  Ascites has completely resolved.  Labs look good.  She did have an episode of hepatic encephalopathy a  few days after the TIPS which required admission.  HA has since remains well treated.  She has fatigue and decreased appetite as well as nausea which persist.   1.) Try mixing lactulose  with ginger-ale or sprite 2.) Will continue to monitor TIPS, Labs and potential need for rifaximin 3.) Next TIPS duplex US , liver labs and clinic visit in 3 months     Electronically Signed: Roxie Cord 07/22/2023, 3:08 PM   I spent a total of  15 Minutes in face to face in clinical consultation, greater than 50% of which was counseling/coordinating care for cirrhosis s/p TIPS creation for recurrent ascites.

## 2023-07-25 NOTE — Progress Notes (Signed)
 Remote pacemaker transmission.

## 2023-07-28 ENCOUNTER — Ambulatory Visit (INDEPENDENT_AMBULATORY_CARE_PROVIDER_SITE_OTHER): Admitting: Podiatry

## 2023-07-28 ENCOUNTER — Encounter: Payer: Self-pay | Admitting: Podiatry

## 2023-07-28 VITALS — Ht 63.0 in | Wt 129.8 lb

## 2023-07-28 DIAGNOSIS — B351 Tinea unguium: Secondary | ICD-10-CM | POA: Diagnosis not present

## 2023-07-28 DIAGNOSIS — M79675 Pain in left toe(s): Secondary | ICD-10-CM | POA: Diagnosis not present

## 2023-07-28 DIAGNOSIS — M79674 Pain in right toe(s): Secondary | ICD-10-CM | POA: Diagnosis not present

## 2023-07-28 DIAGNOSIS — E1142 Type 2 diabetes mellitus with diabetic polyneuropathy: Secondary | ICD-10-CM | POA: Diagnosis not present

## 2023-08-01 ENCOUNTER — Other Ambulatory Visit: Payer: Self-pay

## 2023-08-01 ENCOUNTER — Inpatient Hospital Stay
Admission: EM | Admit: 2023-08-01 | Discharge: 2023-08-03 | DRG: 392 | Disposition: A | Discharge status: Home Health | Attending: Student in an Organized Health Care Education/Training Program | Admitting: Student in an Organized Health Care Education/Training Program

## 2023-08-01 ENCOUNTER — Encounter: Payer: Self-pay | Admitting: Internal Medicine

## 2023-08-01 ENCOUNTER — Emergency Department

## 2023-08-01 DIAGNOSIS — F419 Anxiety disorder, unspecified: Secondary | ICD-10-CM | POA: Diagnosis not present

## 2023-08-01 DIAGNOSIS — K7581 Nonalcoholic steatohepatitis (NASH): Secondary | ICD-10-CM | POA: Diagnosis not present

## 2023-08-01 DIAGNOSIS — R112 Nausea with vomiting, unspecified: Principal | ICD-10-CM | POA: Diagnosis present

## 2023-08-01 DIAGNOSIS — I2510 Atherosclerotic heart disease of native coronary artery without angina pectoris: Secondary | ICD-10-CM | POA: Diagnosis not present

## 2023-08-01 DIAGNOSIS — G4733 Obstructive sleep apnea (adult) (pediatric): Secondary | ICD-10-CM | POA: Diagnosis present

## 2023-08-01 DIAGNOSIS — I10 Essential (primary) hypertension: Secondary | ICD-10-CM | POA: Diagnosis present

## 2023-08-01 DIAGNOSIS — A084 Viral intestinal infection, unspecified: Secondary | ICD-10-CM | POA: Diagnosis not present

## 2023-08-01 DIAGNOSIS — K219 Gastro-esophageal reflux disease without esophagitis: Secondary | ICD-10-CM | POA: Diagnosis present

## 2023-08-01 DIAGNOSIS — K7469 Other cirrhosis of liver: Secondary | ICD-10-CM | POA: Diagnosis present

## 2023-08-01 DIAGNOSIS — Z79899 Other long term (current) drug therapy: Secondary | ICD-10-CM

## 2023-08-01 DIAGNOSIS — Z794 Long term (current) use of insulin: Secondary | ICD-10-CM

## 2023-08-01 DIAGNOSIS — E039 Hypothyroidism, unspecified: Secondary | ICD-10-CM | POA: Diagnosis present

## 2023-08-01 DIAGNOSIS — Z7985 Long-term (current) use of injectable non-insulin antidiabetic drugs: Secondary | ICD-10-CM | POA: Diagnosis not present

## 2023-08-01 DIAGNOSIS — E785 Hyperlipidemia, unspecified: Secondary | ICD-10-CM | POA: Diagnosis not present

## 2023-08-01 DIAGNOSIS — D61818 Other pancytopenia: Secondary | ICD-10-CM | POA: Diagnosis not present

## 2023-08-01 DIAGNOSIS — I110 Hypertensive heart disease with heart failure: Secondary | ICD-10-CM | POA: Diagnosis present

## 2023-08-01 DIAGNOSIS — E876 Hypokalemia: Secondary | ICD-10-CM | POA: Diagnosis not present

## 2023-08-01 DIAGNOSIS — I5032 Chronic diastolic (congestive) heart failure: Secondary | ICD-10-CM | POA: Diagnosis present

## 2023-08-01 DIAGNOSIS — E119 Type 2 diabetes mellitus without complications: Secondary | ICD-10-CM | POA: Diagnosis not present

## 2023-08-01 DIAGNOSIS — Z79890 Hormone replacement therapy (postmenopausal): Secondary | ICD-10-CM

## 2023-08-01 DIAGNOSIS — I959 Hypotension, unspecified: Secondary | ICD-10-CM | POA: Diagnosis not present

## 2023-08-01 DIAGNOSIS — K5900 Constipation, unspecified: Secondary | ICD-10-CM | POA: Diagnosis present

## 2023-08-01 DIAGNOSIS — E7220 Disorder of urea cycle metabolism, unspecified: Secondary | ICD-10-CM | POA: Insufficient documentation

## 2023-08-01 LAB — CBC WITH DIFFERENTIAL/PLATELET
Abs Immature Granulocytes: 0.01 10*3/uL (ref 0.00–0.07)
Basophils Absolute: 0 10*3/uL (ref 0.0–0.1)
Basophils Relative: 1 %
Eosinophils Absolute: 0.2 10*3/uL (ref 0.0–0.5)
Eosinophils Relative: 4 %
HCT: 32.2 % — ABNORMAL LOW (ref 36.0–46.0)
Hemoglobin: 11.4 g/dL — ABNORMAL LOW (ref 12.0–15.0)
Immature Granulocytes: 0 %
Lymphocytes Relative: 31 %
Lymphs Abs: 1.2 10*3/uL (ref 0.7–4.0)
MCH: 32.9 pg (ref 26.0–34.0)
MCHC: 35.4 g/dL (ref 30.0–36.0)
MCV: 92.8 fL (ref 80.0–100.0)
Monocytes Absolute: 0.3 10*3/uL (ref 0.1–1.0)
Monocytes Relative: 7 %
Neutro Abs: 2.1 10*3/uL (ref 1.7–7.7)
Neutrophils Relative %: 57 %
Platelets: 105 10*3/uL — ABNORMAL LOW (ref 150–400)
RBC: 3.47 MIL/uL — ABNORMAL LOW (ref 3.87–5.11)
RDW: 15.5 % (ref 11.5–15.5)
WBC: 3.8 10*3/uL — ABNORMAL LOW (ref 4.0–10.5)
nRBC: 0 % (ref 0.0–0.2)

## 2023-08-01 LAB — COMPREHENSIVE METABOLIC PANEL WITH GFR
ALT: 38 U/L (ref 0–44)
AST: 83 U/L — ABNORMAL HIGH (ref 15–41)
Albumin: 2.8 g/dL — ABNORMAL LOW (ref 3.5–5.0)
Alkaline Phosphatase: 50 U/L (ref 38–126)
Anion gap: 7 (ref 5–15)
BUN: 14 mg/dL (ref 8–23)
CO2: 24 mmol/L (ref 22–32)
Calcium: 8.4 mg/dL — ABNORMAL LOW (ref 8.9–10.3)
Chloride: 103 mmol/L (ref 98–111)
Creatinine, Ser: 0.67 mg/dL (ref 0.44–1.00)
GFR, Estimated: >60 mL/min (ref >60)
Glucose, Bld: 137 mg/dL — ABNORMAL HIGH (ref 70–99)
Potassium: 3.5 mmol/L (ref 3.5–5.1)
Sodium: 134 mmol/L — ABNORMAL LOW (ref 135–145)
Total Bilirubin: 2.5 mg/dL — ABNORMAL HIGH (ref 0.0–1.2)
Total Protein: 6.8 g/dL (ref 6.5–8.1)

## 2023-08-01 LAB — GLUCOSE, CAPILLARY: Glucose-Capillary: 99 mg/dL (ref 70–99)

## 2023-08-01 LAB — URINALYSIS, ROUTINE W REFLEX MICROSCOPIC
Bilirubin Urine: NEGATIVE
Glucose, UA: NEGATIVE mg/dL
Hgb urine dipstick: NEGATIVE
Ketones, ur: NEGATIVE mg/dL
Leukocytes,Ua: NEGATIVE
Nitrite: NEGATIVE
Protein, ur: NEGATIVE mg/dL
Specific Gravity, Urine: 1.004 — ABNORMAL LOW (ref 1.005–1.030)
pH: 7 (ref 5.0–8.0)

## 2023-08-01 LAB — CBG MONITORING, ED: Glucose-Capillary: 106 mg/dL — ABNORMAL HIGH (ref 70–99)

## 2023-08-01 LAB — MAGNESIUM: Magnesium: 2 mg/dL (ref 1.7–2.4)

## 2023-08-01 LAB — LIPASE, BLOOD: Lipase: 39 U/L (ref 11–51)

## 2023-08-01 LAB — AMMONIA: Ammonia: 60 umol/L — ABNORMAL HIGH (ref 9–35)

## 2023-08-01 MED ORDER — MORPHINE SULFATE (PF) 2 MG/ML IV SOLN: 2.0000 mg | INTRAVENOUS | Status: DC | PRN

## 2023-08-01 MED ORDER — ONDANSETRON HCL 4 MG PO TABS: 4.0000 mg | ORAL_TABLET | Freq: Four times a day (QID) | ORAL | Status: DC | PRN

## 2023-08-01 MED ORDER — PANTOPRAZOLE SODIUM 40 MG IV SOLR
40.0000 mg | INTRAVENOUS | Status: DC
  Administered 2023-08-01 – 2023-08-02 (×2): 40 mg via INTRAVENOUS
  Filled 2023-08-01 (×2): qty 10

## 2023-08-01 MED ORDER — SODIUM CHLORIDE 0.9 % IV BOLUS
1000.0000 mL | Freq: Once | INTRAVENOUS | Status: AC
  Administered 2023-08-01: 1000 mL via INTRAVENOUS

## 2023-08-01 MED ORDER — LEVOTHYROXINE SODIUM 50 MCG PO TABS
50.0000 ug | ORAL_TABLET | Freq: Every day | ORAL | Status: DC
  Administered 2023-08-02 – 2023-08-03 (×2): 50 ug via ORAL
  Filled 2023-08-01 (×2): qty 1

## 2023-08-01 MED ORDER — HYDROXYZINE HCL 25 MG PO TABS
25.0000 mg | ORAL_TABLET | Freq: Three times a day (TID) | ORAL | Status: DC | PRN
  Administered 2023-08-01: 25 mg via ORAL
  Filled 2023-08-01: qty 1

## 2023-08-01 MED ORDER — LACTULOSE 10 GM/15ML PO SOLN
30.0000 g | Freq: Once | ORAL | Status: AC
  Administered 2023-08-01: 30 g via ORAL
  Filled 2023-08-01: qty 60

## 2023-08-01 MED ORDER — IOHEXOL 300 MG/ML  SOLN
100.0000 mL | Freq: Once | INTRAMUSCULAR | Status: AC | PRN
  Administered 2023-08-01: 100 mL via INTRAVENOUS

## 2023-08-01 MED ORDER — ENOXAPARIN SODIUM 40 MG/0.4ML IJ SOSY
40.0000 mg | PREFILLED_SYRINGE | INTRAMUSCULAR | Status: DC
  Administered 2023-08-01 – 2023-08-02 (×2): 40 mg via SUBCUTANEOUS
  Filled 2023-08-01 (×2): qty 0.4

## 2023-08-01 MED ORDER — LACTULOSE 10 GM/15ML PO SOLN
30.0000 g | Freq: Three times a day (TID) | ORAL | Status: DC
  Administered 2023-08-02: 30 g via ORAL
  Filled 2023-08-01 (×2): qty 60

## 2023-08-01 MED ORDER — SIMVASTATIN 20 MG PO TABS
20.0000 mg | ORAL_TABLET | Freq: Every day | ORAL | Status: DC
  Administered 2023-08-01 – 2023-08-02 (×2): 20 mg via ORAL
  Filled 2023-08-01 (×2): qty 1

## 2023-08-01 MED ORDER — MIDODRINE HCL 5 MG PO TABS
10.0000 mg | ORAL_TABLET | Freq: Three times a day (TID) | ORAL | Status: DC
  Administered 2023-08-02 – 2023-08-03 (×4): 10 mg via ORAL
  Filled 2023-08-01 (×4): qty 2

## 2023-08-01 MED ORDER — RIFAXIMIN 550 MG PO TABS
550.0000 mg | ORAL_TABLET | Freq: Two times a day (BID) | ORAL | Status: DC
  Administered 2023-08-01 – 2023-08-02 (×3): 550 mg via ORAL
  Filled 2023-08-01 (×5): qty 1

## 2023-08-01 MED ORDER — INSULIN GLARGINE-YFGN 100 UNIT/ML ~~LOC~~ SOLN
6.0000 [IU] | Freq: Every day | SUBCUTANEOUS | Status: DC
  Filled 2023-08-01: qty 0.06

## 2023-08-01 MED ORDER — ACETAMINOPHEN 325 MG PO TABS: 650.0000 mg | ORAL_TABLET | Freq: Four times a day (QID) | ORAL | Status: DC | PRN

## 2023-08-01 MED ORDER — INSULIN DEGLUDEC 200 UNIT/ML ~~LOC~~ SOPN: 6.0000 [IU] | PEN_INJECTOR | Freq: Every day | SUBCUTANEOUS | Status: DC

## 2023-08-01 MED ORDER — ACETAMINOPHEN 650 MG RE SUPP: 650.0000 mg | Freq: Four times a day (QID) | RECTAL | Status: DC | PRN

## 2023-08-01 MED ORDER — SODIUM CHLORIDE 0.9 % IV SOLN
12.5000 mg | Freq: Once | INTRAVENOUS | Status: AC
  Administered 2023-08-01: 12.5 mg via INTRAVENOUS
  Filled 2023-08-01: qty 12.5

## 2023-08-01 MED ORDER — SODIUM CHLORIDE 0.9 % IV SOLN: INTRAVENOUS | Status: DC

## 2023-08-01 MED ORDER — HYDROCODONE-ACETAMINOPHEN 5-325 MG PO TABS: 1.0000 | ORAL_TABLET | ORAL | Status: DC | PRN

## 2023-08-01 MED ORDER — FENOFIBRATE 160 MG PO TABS
160.0000 mg | ORAL_TABLET | Freq: Every day | ORAL | Status: DC
  Administered 2023-08-01 – 2023-08-02 (×2): 160 mg via ORAL
  Filled 2023-08-01 (×2): qty 1

## 2023-08-01 MED ORDER — ONDANSETRON HCL 4 MG/2ML IJ SOLN
4.0000 mg | Freq: Four times a day (QID) | INTRAMUSCULAR | Status: DC | PRN
  Administered 2023-08-02 (×2): 4 mg via INTRAVENOUS
  Filled 2023-08-01 (×2): qty 2

## 2023-08-01 NOTE — ED Provider Notes (Signed)
 Care assumed of patient from outgoing provider.  See their note for initial history, exam and plan.  Clinical Course as of 08/01/23 1904  Fri Aug 01, 2023  1505 NASH and cirrhosis - recent TIPS procedure in April. N/v, diffuse abd pain. Mild confusion? Ammonia mild elevate - unable to keep down lactulose  at home.  No concern for GIB.  Given IVF and antiemetics.  Plan to admit after CT. EKG w/ QT 500.  [SM]  1759 Called over to [facility] radiology to read CT scan of the abdomen and pelvis.  Repeat glucose obtained. [SM]  1802 Repeat glucose within normal limits.  Patient states that she feels more tired than her normal.  No longer with nausea and vomiting so we will give her her dose of lactulose  which was missed given her elevated ammonia level. [SM]  1903 On reevaluation patient continues to state that she does not feel well.  Ongoing nausea, was able to keep down her lactulose .  Complaining of ongoing abdominal pain.  CT scan with findings concerning for severe constipation.  Did note her TIPSS procedure.  Given her constipation while being on lactulose , intractable nausea and vomiting and unable to keep down her home medications will consult hospitalist for admission.  Have a low suspicion for SBP. [SM]    Clinical Course User Index [SM] Viviano Ground, MD     Viviano Ground, MD 08/01/23 [phone]

## 2023-08-01 NOTE — H&P (Signed)
 History and Physical    Patient: Annette Foster FMW:[id number] DOB: [birth year] DOA: 08/01/2023 DOS: the patient was seen and examined on 08/01/2023 PCP: Alla Amis, MD  Patient coming from: Home  Chief Complaint:  Chief Complaint  Patient presents with   Emesis   HPI: Annette Foster is a 83 y.o. 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  Patient has had several recent hospitalizations for complications of her cirrhosis, however most recently from 5/17 to 07/01/2023 with aspiration pneumonia. In the ED she had borderline soft blood pressures as low as 108/49 with otherwise normal vitals. Labs notable for pancytopenia with WBC 3.8, hemoglobin 11.5 and 5 which is about her baseline.  Creatinine normal at 0.67 electrolytes for the most part unremarkable.  LFTs notable for AST 83 and total bilirubin 2.5.  Lipase 39.  Ammonia was 60.  UA unremarkable.  EKG showed sinus rhythm at 83 with a left bundle branch block CT abdomen and pelvis showed no acute process.  Did show a large amount of stool, cirrhotic liver with TIPS shunt and multifocal patchy airspace opacities bilateral lung bases however decreased from prior. Patient treated with multiple antiemetics including promethazine , Zofran  and she was also given an NS bolus. Admission requested    Review of Systems: As mentioned in the history of present illness. All other systems reviewed and are negative. Past Medical History:  Diagnosis Date    Abdominal pain    Allergy    Cancer (HCC) 2017   breast cancer- Left   Cataract    CHF (congestive heart failure) (HCC)    Collagenous colitis    Coronary artery disease    Diabetes mellitus without complication (HCC)    Diarrhea    Diverticulosis    Dysrhythmia    Fatty liver disease, nonalcoholic    Fibrocystic breast    GERD (gastroesophageal reflux disease)    Heart murmur    Hyperlipidemia    Hypertension    Hypothyroidism    IBS (irritable bowel syndrome)    IDA (iron  deficiency anemia)    Liver cirrhosis (HCC)    Personal history of radiation therapy 2017   LEFT BREAST CA   Presence of permanent cardiac pacemaker    Sleep apnea    C-Pap   Past Surgical History:  Procedure Laterality Date   ABDOMINAL HYSTERECTOMY     [place] bso   ABDOMINAL SURGERY     APPENDECTOMY     AV NODE ABLATION N/A 05/17/2021   Procedure: AV NODE ABLATION;  Surgeon: Cindie Ole DASEN, MD;  Location: [facility];  Service: Cardiovascular;  Laterality: N/A;   BREAST BIOPSY Bilateral 2016   negative   BREAST 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/2015   DCIS, papillary carcinoma in situ   BREAST BIOPSY Left 05/27/2016   BENIGN MAMMARY EPITHELIUM   BREAST BIOPSY Left 11/20/2017   affirm bx x clip BENIGN MAMMARY EPITHELIUM CONSISTENT WITH RAD THERAPY   BREAST EXCISIONAL BIOPSY Right    NEG 1980's   BREAST LUMPECTOMY Left 10/17/2015   DCIS and papillary carcinoma insitu, clear margins   CARDIAC  SURGERY     has replacement valve   CATARACT EXTRACTION Right    CATARACT EXTRACTION W/PHACO Left 01/16/2021   Procedure: CATARACT EXTRACTION PHACO AND INTRAOCULAR LENS PLACEMENT (IOC) LEFT DIABETIC 8.46 00:59.8;  Surgeon: Jaye Fallow, MD;  Location: [facility];  Service: Ophthalmology;  Laterality: Left;   CHOLECYSTECTOMY     COLONOSCOPY WITH PROPOFOL  N/A 03/11/2016   Procedure: COLONOSCOPY WITH PROPOFOL ;  Surgeon: Lamar ONEIDA Holmes, MD;  Location: [facility] ENDOSCOPY;  Service: Endoscopy;  Laterality:  N/A;   COLONOSCOPY WITH PROPOFOL  N/A 02/18/2020   Procedure: COLONOSCOPY WITH PROPOFOL ;  Surgeon: Jinny Carmine, MD;  Location: [facility] ENDOSCOPY;  Service: Endoscopy;  Laterality: N/A;   COLONOSCOPY WITH PROPOFOL  N/A 09/10/2022   Procedure: COLONOSCOPY WITH PROPOFOL ;  Surgeon: Therisa Bi, MD;  Location: [facility] ENDOSCOPY;  Service: Gastroenterology;  Laterality: N/A;   ESOPHAGEAL BANDING  09/10/2022   Procedure: ESOPHAGEAL BANDING;  Surgeon: Therisa Bi, MD;  Location: [facility] ENDOSCOPY;  Service: Gastroenterology;;   ESOPHAGEAL BANDING  10/17/2022   Procedure: ESOPHAGEAL BANDING;  Surgeon: Jinny Carmine, MD;  Location: [facility] ENDOSCOPY;  Service: Endoscopy;;   ESOPHAGEAL BANDING  12/24/2022   Procedure: ESOPHAGEAL BANDING;  Surgeon: Jinny Carmine, MD;  Location: [facility] ENDOSCOPY;  Service: Endoscopy;;   ESOPHAGEAL BANDING  05/13/2023   Procedure: ESOPHAGOSCOPY, WITH ESOPHAGEAL VARICES BAND LIGATION;  Surgeon: Jinny Carmine, MD;  Location: [facility] ENDOSCOPY;  Service: Endoscopy;;   ESOPHAGOGASTRODUODENOSCOPY N/A 05/13/2023   Procedure: EGD (ESOPHAGOGASTRODUODENOSCOPY);  Surgeon: Jinny Carmine, MD;  Location: [facility] ENDOSCOPY;  Service: Endoscopy;  Laterality: N/A;   ESOPHAGOGASTRODUODENOSCOPY (EGD) WITH PROPOFOL  N/A 03/11/2016   Procedure: ESOPHAGOGASTRODUODENOSCOPY (EGD) WITH PROPOFOL ;  Surgeon: Lamar ONEIDA Holmes, MD;  Location: [facility] ENDOSCOPY;  Service: Endoscopy;  Laterality: N/A;   ESOPHAGOGASTRODUODENOSCOPY (EGD) WITH PROPOFOL  N/A 02/18/2020   Procedure: ESOPHAGOGASTRODUODENOSCOPY (EGD) WITH PROPOFOL ;  Surgeon: Jinny Carmine, MD;  Location: [facility] ENDOSCOPY;  Service: Endoscopy;  Laterality: N/A;   ESOPHAGOGASTRODUODENOSCOPY (EGD) WITH PROPOFOL  N/A 04/25/2020   Procedure: ESOPHAGOGASTRODUODENOSCOPY (EGD) WITH PROPOFOL ;  Surgeon: Jinny Carmine, MD;  Location: [facility] ENDOSCOPY;  Service: Endoscopy;  Laterality: N/A;   ESOPHAGOGASTRODUODENOSCOPY (EGD) WITH PROPOFOL  N/A 05/23/2020   Procedure: ESOPHAGOGASTRODUODENOSCOPY (EGD)  WITH PROPOFOL ;  Surgeon: Jinny Carmine, MD;  Location: [facility] ENDOSCOPY;  Service: Endoscopy;  Laterality: N/A;   ESOPHAGOGASTRODUODENOSCOPY (EGD) WITH PROPOFOL  N/A 09/10/2022   Procedure: ESOPHAGOGASTRODUODENOSCOPY (EGD) WITH PROPOFOL ;  Surgeon: Therisa Bi, MD;  Location: [facility] ENDOSCOPY;  Service: Gastroenterology;  Laterality: N/A;   ESOPHAGOGASTRODUODENOSCOPY (EGD) WITH PROPOFOL  N/A 10/17/2022   Procedure: ESOPHAGOGASTRODUODENOSCOPY (EGD) WITH PROPOFOL ;  Surgeon: Jinny Carmine, MD;  Location: [facility] ENDOSCOPY;  Service: Endoscopy;  Laterality: N/A;   ESOPHAGOGASTRODUODENOSCOPY (EGD) WITH PROPOFOL  N/A 12/24/2022   Procedure: ESOPHAGOGASTRODUODENOSCOPY (EGD) WITH PROPOFOL ;  Surgeon: Jinny Carmine, MD;  Location: [facility] ENDOSCOPY;  Service: Endoscopy;  Laterality: N/A;   ESOPHAGOGASTRODUODENOSCOPY (EGD) WITH PROPOFOL  N/A 01/29/2023   Procedure: ESOPHAGOGASTRODUODENOSCOPY (EGD) WITH PROPOFOL ;  Surgeon: Maryruth Ole ONEIDA, MD;  Location: [facility] ENDOSCOPY;  Service: Endoscopy;  Laterality: N/A;  Ideally we would intubate if possible given concern for varices   EUS N/A 10/04/2021   Procedure: LOWER ENDOSCOPIC ULTRASOUND (EUS);  Surgeon: Elta Fonda SQUIBB, MD;  Location: [facility] ENDOSCOPY;  Service: Gastroenterology;  Laterality: N/A;  [facility]   EYE SURGERY     FINGER ARTHROSCOPY WITH CARPOMETACARPEL (CMC) ARTHROPLASTY Right 09/03/2018   Procedure: CARPOMETACARPEL [facility]) ARTHROPLASTY RIGHT THUMB;  Surgeon: Kathlynn Sharper, MD;  Location: [facility] ORS;  Service: Orthopedics;  Laterality: Right;   FLEXIBLE SIGMOIDOSCOPY N/A 09/14/2021   Procedure: FLEXIBLE SIGMOIDOSCOPY;  Surgeon: Jinny Carmine, MD;  Location: [facility] ENDOSCOPY;  Service: Endoscopy;  Laterality: N/A;  No anesthesia   GANGLION CYST EXCISION Right 09/03/2018   Procedure: REMOVAL GANGLION OF WRIST;  Surgeon: Kathlynn Sharper, MD;  Location: [facility] ORS;  Service: Orthopedics;  Laterality: Right;   HARDWARE REMOVAL Right 09/03/2018   Procedure: HARDWARE REMOVAL RIGHT  THUMB;  Surgeon: Kathlynn Sharper, MD;  Location: [facility] OR[facility];  Service: Orthopedics;  Laterality: Right;  staple removed   HEMOSTASIS CLIP PLACEMENT  09/10/2022   Procedure: HEMOSTASIS CLIP PLACEMENT;  Surgeon: Therisa Bi, MD;  Location: [facility] ENDOSCOPY;  Service: Gastroenterology;;   HEMOSTASIS CONTROL  09/10/2022   Procedure: HEMOSTASIS CONTROL;  Surgeon: Therisa Bi, MD;  Location: [facility] ENDOSCOPY;  Service: Gastroenterology;;   HEMOSTASIS CONTROL  01/29/2023   Procedure: HEMOSTASIS CONTROL;  Surgeon: Maryruth Ole DASEN, MD;  Location: [facility];  Service: Endoscopy;;   IR PARACENTESIS  06/09/2023   IR RADIOLOGIST EVAL & MGMT  07/22/2023   IR TIPS  06/09/2023   JOINT REPLACEMENT Left    TKR   left sinusplasty      MASTECTOMY, PARTIAL Left 10/17/2015   Procedure: MASTECTOMY PARTIAL REVISION;  Surgeon: Larinda Unknown Sharps, MD;  Location: [facility] ORS;  Service: General;  Laterality: Left;   PACEMAKER IMPLANT N/A 03/23/2021   Procedure: PACEMAKER IMPLANT;  Surgeon: Cindie Ole DASEN, MD;  Location: [facility];  Service: Cardiovascular;  Laterality: N/A;   PARTIAL MASTECTOMY WITH NEEDLE LOCALIZATION Left 09/29/2015   Procedure: PARTIAL MASTECTOMY WITH NEEDLE LOCALIZATION;  Surgeon: Larinda Unknown Sharps, MD;  Location: [facility] OR[facility];  Service: General;  Laterality: Left;   POLYPECTOMY  09/10/2022   Procedure: POLYPECTOMY;  Surgeon: Therisa Bi, MD;  Location: [facility] ENDOSCOPY;  Service: Gastroenterology;;   RECTAL EXAM UNDER ANESTHESIA N/A 10/05/2021   Procedure: RECTAL EXAM UNDER ANESTHESIA, ASPIRATION OF RECTAL CYST;  Surgeon: Rodolph Romano, MD;  Location: [facility] ORS;  Service: General;  Laterality: N/A;   TOTAL ABDOMINAL HYSTERECTOMY W/ BILATERAL SALPINGOOPHORECTOMY     Social History:  reports that she has never smoked. She has never used smokeless tobacco. She reports that she does not drink alcohol and does not use drugs.  Allergies  Allergen Reactions   Codeine Itching, Nausea And  Vomiting and Other (See Comments)   Demeclocycline Rash   Demerol [Meperidine] Itching and Nausea And Vomiting   Hydrocodone  Itching and Nausea And Vomiting   Morphine  Nausea Only   Other Other (See Comments)   Oxycodone  Itching and Nausea And Vomiting   Pentazocine Itching, Nausea And Vomiting and Other (See Comments)   Tetracyclines & Related Rash   Coal Tar Extract Other (See Comments)   Hydrocodone -Acetaminophen  Other (See Comments)   Salicylic Acid Other (See Comments)   Tetracycline Hcl Other (See Comments)    Family History  Problem Relation Age of Onset   Breast cancer Paternal Grandmother    Colon cancer Father    Diabetes Sister    Diabetes Brother    Heart disease Brother    Prostate cancer Brother    Colon cancer Maternal Uncle    Prostate cancer Brother    Bladder Cancer Brother    Leukemia Mother        all   Ovarian cancer Neg Hx    Kidney cancer Neg Hx     Prior to Admission medications   Medication Sig Start Date End Date Taking? Authorizing Provider  azelastine  (OPTIVAR ) 0.05 % ophthalmic solution 1 drop into affected eye Ophthalmic Twice a day; Duration:  30 day 09/18/21  Yes [provider]  Cholecalciferol  (VITAMIN D ) 50 MCG (2000 UT) CAPS Take 2,000 Units by mouth daily.   Yes [provider]  clobetasol  ointment (TEMOVATE ) 0.05 % Apply 1 application topically as needed (vaginal irritation).   Yes [provider]  clotrimazole  (LOTRIMIN ) 1 % cream Apply 1 Application topically 2 (two) times daily as needed.   Yes [provider]  cyanocobalamin (,VITAMIN B-12,) 1000 MCG/ML injection 1,000 mcg every 30 (thirty) days. 04/23/19  Yes [provider]  EPINEPHrine  0.3 mg/0.3 mL IJ SOAJ injection Inject 0.3 mg into the muscle. 05/18/19  Yes [provider]  esomeprazole (NEXIUM) 40 MG capsule Take 40 mg by mouth 2 (two) times daily before a meal.  02/08/16  Yes [provider]  fenofibrate  160 MG tablet  Take 160 mg by mouth at bedtime.   Yes [provider]  gabapentin  (NEURONTIN ) 300 MG capsule Take 300 mg by mouth 2 (two) times daily. 09/07/16 10/25/36 Yes Corlis Honor BROCKS, MD  insulin  aspart (NOVOLOG ) 100 UNIT/ML FlexPen Inject 2 Units into the skin 3 (three) times daily with meals. If eating and Blood Glucose (BG) 80 or higher inject 2 units for meal coverage and add correction dose per scale. If not eating, correction dose only. BG <150= 0 unit; BG 150-200= 1 unit; BG 201-250= 2 unit; BG 251-300= 3 unit; BG 301-350= 4 unit; BG 351-400= 5 unit; BG >400= 6 unit and Call Primary care. 06/18/23  Yes Wieting, Richard, MD  insulin  degludec (TRESIBA  FLEXTOUCH) 200 UNIT/ML FlexTouch Pen Inject 6 Units into the skin at bedtime. 06/18/23  Yes Wieting, Richard, MD  lactulose  (CHRONULAC ) 10 GM/15ML solution Take 45 mLs (30 g total) by mouth daily. 06/18/23  Yes Wieting, Richard, MD  levocetirizine (XYZAL) 5 MG tablet Take 5 mg by mouth every evening.   Yes [provider]  levothyroxine  (SYNTHROID ) 50 MCG tablet Take 50 mcg by mouth. Take 1 tablet (50 mcg total) by mouth once daily 07/21/23  Yes [provider]  magnesium  oxide (MAG-OX) 400 MG tablet Take 400 mg by mouth 2 (two) times daily.    Yes [provider]  midodrine  (PROAMATINE ) 10 MG tablet Take 1 tablet (10 mg total) by mouth 3 (three) times daily with meals. 06/18/23  Yes Wieting, Richard, MD  nadolol  (CORGARD ) 20 MG tablet Take 1 tablet (20 mg total) by mouth daily. 12/20/22  Yes End, Lonni, MD  ondansetron  (ZOFRAN ) 4 MG tablet Take 1 tablet (4 mg total) by mouth every 8 (eight) hours as needed for nausea. 07/14/23  Yes Brahmanday, Govinda R, MD  ondansetron  (ZOFRAN ) 8 MG tablet Take 1 tablet (8 mg total) by mouth every 8 (eight) hours as needed for nausea or vomiting. 07/14/23  Yes Rennie Cindy SAUNDERS, MD  potassium chloride  (KLOR-CON ) 20 MEQ packet 1 packet with food Orally Once a day; Duration: 30 day(s)   Yes [provider]  rifaximin  (XIFAXAN ) 550 MG TABS tablet Take 550 mg by mouth. take 1 tablet (550 mg total) by mouth 2 (two) times daily for 120 days 07/23/23 11/20/23 Yes [provider]  simvastatin  (ZOCOR ) 20 MG tablet Take 20 mg by mouth at bedtime.   Yes [provider]  spironolactone  (ALDACTONE ) 50 MG tablet Take 1.5 tablets (75 mg total) by mouth daily. 06/27/23  Yes End, Lonni, MD  tirzepatide  (MOUNJARO ) 15 MG/0.5ML Pen Inject 15 mg into the skin once a week. On Fridays   Yes [provider]  torsemide  (DEMADEX ) 20 MG tablet Take 3 tablets (60 mg total) by mouth daily. 06/26/23  Yes End, Lonni, MD  traMADol  (ULTRAM ) 50 MG tablet Take 1 tablet (50 mg total) by mouth every 8 (eight) hours as needed. Patient taking differently: Take 50 mg by mouth every 8 (eight) hours as needed for moderate pain (pain score 4-6). 06/04/23  Yes Brahmanday, Govinda R, MD  valACYclovir  (VALTREX ) 500 MG tablet Take 500 mg by mouth 2 (two) times daily as needed. 02/10/23  Yes [provider]  albuterol  (VENTOLIN  HFA) 108 (90 Base) MCG/ACT inhaler Inhale 2 puffs into the lungs every 4 (four) hours as needed for wheezing or shortness of breath. Patient not taking: Reported on 07/22/2023 04/30/23 04/29/24  [provider]  [employer] POTASSIUM GLUCONATE PO Take 650 mg by mouth daily.    [provider]  hydrOXYzine  (ATARAX ) 25 MG tablet 1 tablet as needed Orally every 8 hrs; Duration: 30 day(s)    [provider]  ipratropium (ATROVENT) 0.03 % nasal spray Place 1 spray into both nostrils 2 (two) times daily as needed for rhinitis. Patient not taking: Reported on 07/22/2023 04/15/23 04/14/24  [provider]  levothyroxine  (SYNTHROID , LEVOTHROID) 75 MCG tablet Take 50 mcg by mouth daily before breakfast. Patient not taking: Reported on 08/01/2023    [provider]  saccharomyces boulardii (FLORASTOR) 250 MG capsule Take 250 mg by mouth. Take 250 mg  by mouth 2 (two) times daily    [provider]    Physical Exam: Vitals:   08/01/23 1804 08/01/23 1858 08/01/23 2002 08/01/23 2026  BP:  (S) (!) 108/49 (!) 121/56 (!) 126/54  Pulse:  80 78 60  Resp:  18 20 16   Temp: 97.8 F (36.6 C)   (!) 97.5 F (36.4 C)  TempSrc: Oral   Oral  SpO2:  100% 100% 100%  Weight:      Height:       Physical Exam Vitals and nursing note reviewed.  Constitutional:      General: She is not in acute distress. HENT:     Head: Normocephalic and atraumatic.   Cardiovascular:     Rate and Rhythm: Normal rate and regular rhythm.     Heart sounds: Normal heart sounds.  Pulmonary:     Effort: Pulmonary effort is normal.     Breath sounds: Normal breath sounds.  Abdominal:     Palpations: Abdomen is soft.     Tenderness: There is no abdominal tenderness.   Neurological:     Mental Status: Mental status is at baseline.     Data Reviewed: Relevant notes from primary care and specialist visits, past discharge summaries as available in EHR, including Care Everywhere. Prior diagnostic testing as pertinent to current admission diagnoses Updated medications and problem lists for reconciliation ED course, including vitals, labs, imaging, treatment and response to treatment Triage notes, nursing and pharmacy notes and ED provider's notes Notable results as noted in HPI   Assessment and Plan:   Intractable nausea and vomiting CT abdomen and pelvis nonacute Possibly gastritis, constipation as seen on CT abdomen and pelvis Will treat with Protonix  and IV antiemetics Clear liquid diet as tolerated Follow UA IV hydration  Hyperammonemia - Possible mildly decompensated liver cirrhosis History of TIPS 4/28 CT abdomen nonacute Hold nadolol , spironolactone , torsemide  due to softer blood pressure Lactulose  3 times daily   Pancytopenia (HCC):  -stable  Hypothyroidism -Synthroid    Hypotension with history of hypertension:  -Blood pressure  is soft, - Continue nadolol , spironolactone , torsemide  with hold parameters - Continue midodrine  5 mg 3 times daily   Chronic heart failure with preserved ejection fraction (HFpEF) (HCC):  - Clinically dry -2D echo on 10/07/2022 showed EF of 55 to 60% with grade 2     HLD (hyperlipidemia) - Fenofibrate  and zocor    Diabetes mellitus without complication (HCC): Recent A1c 5.6, well-controlled.  Patient taking NovoLog , monitor, Tresiba  200 unit daily -Sliding scale insulin  - Glargine insulin    OSA - on CPAP     Anxiety - Start Xanax  0.25 mg twice daily as needed   Advance Care Planning:   Code Status: Full Code   Consults:   Family Communication:   Severity of Illness: The appropriate patient status for this patient is OBSERVATION. Observation status is judged to be reasonable and necessary in order to provide the required intensity of service to ensure the patient's safety. The patient's presenting symptoms, physical exam findings, and initial radiographic and laboratory data in the context of their medical condition is felt to place them at decreased risk for further clinical deterioration. Furthermore, it is anticipated that the patient will be medically stable for discharge from the hospital within 2 midnights of admission.   Author: Delayne LULLA Solian, MD 08/01/2023 8:37 PM  For on call review [email or web address].

## 2023-08-01 NOTE — ED Triage Notes (Signed)
 Pt reports that she had a tips procedure April 28th, is taking lactulose , has had n/v since yesterday, unable to eat or hold anything down, hasn't had bm for 2 days

## 2023-08-01 NOTE — ED Provider Notes (Signed)
 [facility] Provider Note    Event Date/Time   First MD Initiated Contact with Patient 08/01/23 1253     (approximate)   History   Chief Complaint Emesis   HPI  Annette Foster is a 83 y.o. female with past medical history of hypertension, hyperlipidemia, diabetes, NASH cirrhosis status post TIPS, diastolic CHF, and IDA who presents to the ED complaining of nausea and vomiting.  Patient reports that over the past 2 days she has been feeling constantly nauseous despite regular doses of Zofran .  She has had increasing pain across her entire abdomen, but denies any fevers, flank pain, or dysuria.  She has not had a bowel movement in 2 days and daughter is concerned that she has not been able to take her medications.  Daughter has not noticed any confusion but patient has been feeling increasingly weak.     Physical Exam   Triage Vital Signs: ED Triage Vitals [08/01/23 1236]  Encounter Vitals Group     BP (!) 121/55     Girls Systolic BP Percentile      Girls Diastolic BP Percentile      Boys Systolic BP Percentile      Boys Diastolic BP Percentile      Pulse Rate 68     Resp 18     Temp 97.9 F (36.6 C)     Temp Source Oral     SpO2 100 %     Weight 125 lb 6.4 oz (56.9 kg)     Height 5' 3 (1.6 m)     Head Circumference      Peak Flow      Pain Score 8     Pain Loc      Pain Education      Exclude from Growth Chart     Most recent vital signs: Vitals:   08/01/23 1236 08/01/23 1334  BP: (!) 121/55 (!) 115/57  Pulse: 68 82  Resp: 18 10  Temp: 97.9 F (36.6 C)   SpO2: 100% 100%    Constitutional: Alert and oriented. Eyes: Conjunctivae are normal. Head: Atraumatic. Nose: No congestion/rhinnorhea. Mouth/Throat: Mucous membranes are moist.  Cardiovascular: Normal rate, regular rhythm. Grossly normal heart sounds.  2+ radial pulses bilaterally. Respiratory: Normal respiratory effort.  No retractions. Lungs CTAB. Gastrointestinal:  Soft and diffusely tender to palpation with no rebound or guarding. No distention. Musculoskeletal: No lower extremity tenderness nor edema.  Neurologic:  Normal speech and language. No gross focal neurologic deficits are appreciated.  Asterixis noted.    ED Results / Procedures / Treatments   Labs (all labs ordered are listed, but only abnormal results are displayed) Labs Reviewed  CBC WITH DIFFERENTIAL/PLATELET - Abnormal; Notable for the following components:      Result Value   WBC 3.8 (*)    RBC 3.47 (*)    Hemoglobin 11.4 (*)    HCT 32.2 (*)    Platelets 105 (*)    All other components within normal limits  COMPREHENSIVE METABOLIC PANEL WITH GFR - Abnormal; Notable for the following components:   Sodium 134 (*)    Glucose, Bld 137 (*)    Calcium 8.4 (*)    Albumin  2.8 (*)    AST 83 (*)    Total Bilirubin 2.5 (*)    All other components within normal limits  URINALYSIS, ROUTINE W REFLEX MICROSCOPIC - Abnormal; Notable for the following components:   Color, Urine YELLOW (*)    APPearance  CLEAR (*)    Specific Gravity, Urine 1.004 (*)    All other components within normal limits  AMMONIA - Abnormal; Notable for the following components:   Ammonia 60 (*)    All other components within normal limits  LIPASE, BLOOD  MAGNESIUM     PROCEDURES:  Critical Care performed: No  Procedures   MEDICATIONS ORDERED IN ED: Medications  promethazine  (PHENERGAN ) 12.5 mg in sodium chloride  0.9 % 50 mL IVPB (0 mg Intravenous Stopped 08/01/23 1501)  sodium chloride  0.9 % bolus 1,000 mL (0 mLs Intravenous Stopped 08/01/23 1501)  iohexol  (OMNIPAQUE ) 300 MG/ML solution 100 mL (100 mLs Intravenous Contrast Given 08/01/23 1506)     IMPRESSION / MDM / ASSESSMENT AND PLAN / ED COURSE  I reviewed the triage vital signs and the nursing notes.                              83 y.o. female with past medical history of hypertension, hyperlipidemia, diabetes, NASH cirrhosis status post TIPS,  diastolic CHF, and IDA who presents to the ED complaining of persistent nausea and vomiting over the past 2 days with associated abdominal pain.  Patient's presentation is most consistent with acute presentation with potential threat to life or bodily function.  Differential diagnosis includes, but is not limited to, bowel obstruction, pancreatitis, hepatitis, cholecystitis, biliary colic, gastroenteritis, dehydration, electrolyte abnormality, AKI, hepatic encephalopathy.  Patient chronically ill but nontoxic-appearing and in no acute distress, vital signs are unremarkable.  Her abdomen is soft but she has diffuse tenderness to palpation on exam, will further assess with CT imaging.  Lab results are pending, will treat with IV Phenergan  and hydrate with IV fluids.  She has asterixis concerning for impending hepatic encephalopathy, will check ammonia level.  Labs with mild leukopenia and stable anemia, no significant electrolyte abnormality or AKI.  LFTs are unremarkable, bilirubin stable compared to previous.  Urinalysis with no signs of infection, CT abdomen/pelvis is pending at this time.  Patient turned over to oncoming provider pending CT results, anticipate admission for intractable nausea and vomiting.    FINAL CLINICAL IMPRESSION(S) / ED DIAGNOSES   Final diagnoses:  Intractable nausea and vomiting  Other cirrhosis of liver (HCC)     Rx / DC Orders   ED Discharge Orders     None        Note:  This document was prepared using Dragon voice recognition software and may include unintentional dictation errors.   Twilla Galea, MD 08/01/23 [phone]

## 2023-08-01 NOTE — Progress Notes (Signed)
 Rogue Clear NP notified FSBS 99, ordered to hold pm dose of insulin 

## 2023-08-02 DIAGNOSIS — E039 Hypothyroidism, unspecified: Secondary | ICD-10-CM | POA: Diagnosis not present

## 2023-08-02 DIAGNOSIS — F419 Anxiety disorder, unspecified: Secondary | ICD-10-CM | POA: Diagnosis not present

## 2023-08-02 DIAGNOSIS — K7469 Other cirrhosis of liver: Secondary | ICD-10-CM | POA: Diagnosis not present

## 2023-08-02 DIAGNOSIS — Z79890 Hormone replacement therapy (postmenopausal): Secondary | ICD-10-CM | POA: Diagnosis not present

## 2023-08-02 DIAGNOSIS — E876 Hypokalemia: Secondary | ICD-10-CM | POA: Diagnosis not present

## 2023-08-02 DIAGNOSIS — A084 Viral intestinal infection, unspecified: Secondary | ICD-10-CM | POA: Diagnosis not present

## 2023-08-02 DIAGNOSIS — I5032 Chronic diastolic (congestive) heart failure: Secondary | ICD-10-CM | POA: Diagnosis not present

## 2023-08-02 DIAGNOSIS — G4733 Obstructive sleep apnea (adult) (pediatric): Secondary | ICD-10-CM | POA: Diagnosis not present

## 2023-08-02 DIAGNOSIS — Z79899 Other long term (current) drug therapy: Secondary | ICD-10-CM | POA: Diagnosis not present

## 2023-08-02 DIAGNOSIS — Z794 Long term (current) use of insulin: Secondary | ICD-10-CM | POA: Diagnosis not present

## 2023-08-02 DIAGNOSIS — Z7985 Long-term (current) use of injectable non-insulin antidiabetic drugs: Secondary | ICD-10-CM | POA: Diagnosis not present

## 2023-08-02 DIAGNOSIS — I959 Hypotension, unspecified: Secondary | ICD-10-CM | POA: Diagnosis not present

## 2023-08-02 DIAGNOSIS — K5900 Constipation, unspecified: Secondary | ICD-10-CM | POA: Diagnosis not present

## 2023-08-02 DIAGNOSIS — R112 Nausea with vomiting, unspecified: Secondary | ICD-10-CM | POA: Diagnosis not present

## 2023-08-02 DIAGNOSIS — E785 Hyperlipidemia, unspecified: Secondary | ICD-10-CM | POA: Diagnosis not present

## 2023-08-02 DIAGNOSIS — K219 Gastro-esophageal reflux disease without esophagitis: Secondary | ICD-10-CM | POA: Diagnosis not present

## 2023-08-02 DIAGNOSIS — D61818 Other pancytopenia: Secondary | ICD-10-CM | POA: Diagnosis not present

## 2023-08-02 DIAGNOSIS — K7581 Nonalcoholic steatohepatitis (NASH): Secondary | ICD-10-CM | POA: Diagnosis not present

## 2023-08-02 DIAGNOSIS — E119 Type 2 diabetes mellitus without complications: Secondary | ICD-10-CM | POA: Diagnosis not present

## 2023-08-02 DIAGNOSIS — I2510 Atherosclerotic heart disease of native coronary artery without angina pectoris: Secondary | ICD-10-CM | POA: Diagnosis not present

## 2023-08-02 DIAGNOSIS — I110 Hypertensive heart disease with heart failure: Secondary | ICD-10-CM | POA: Diagnosis not present

## 2023-08-02 LAB — AMMONIA: Ammonia: 62 umol/L — ABNORMAL HIGH (ref 9–35)

## 2023-08-02 LAB — COMPREHENSIVE METABOLIC PANEL WITH GFR
ALT: 34 U/L (ref 0–44)
AST: 72 U/L — ABNORMAL HIGH (ref 15–41)
Albumin: 2.4 g/dL — ABNORMAL LOW (ref 3.5–5.0)
Alkaline Phosphatase: 42 U/L (ref 38–126)
Anion gap: 4 — ABNORMAL LOW (ref 5–15)
BUN: 11 mg/dL (ref 8–23)
CO2: 22 mmol/L (ref 22–32)
Calcium: 8.2 mg/dL — ABNORMAL LOW (ref 8.9–10.3)
Chloride: 111 mmol/L (ref 98–111)
Creatinine, Ser: 0.55 mg/dL (ref 0.44–1.00)
GFR, Estimated: >60 mL/min (ref >60)
Glucose, Bld: 80 mg/dL (ref 70–99)
Potassium: 3.4 mmol/L — ABNORMAL LOW (ref 3.5–5.1)
Sodium: 137 mmol/L (ref 135–145)
Total Bilirubin: 2.1 mg/dL — ABNORMAL HIGH (ref 0.0–1.2)
Total Protein: 5.6 g/dL — ABNORMAL LOW (ref 6.5–8.1)

## 2023-08-02 LAB — CBC
HCT: 26 % — ABNORMAL LOW (ref 36.0–46.0)
Hemoglobin: 9.3 g/dL — ABNORMAL LOW (ref 12.0–15.0)
MCH: 32.7 pg (ref 26.0–34.0)
MCHC: 35.8 g/dL (ref 30.0–36.0)
MCV: 91.5 fL (ref 80.0–100.0)
Platelets: 89 10*3/uL — ABNORMAL LOW (ref 150–400)
RBC: 2.84 MIL/uL — ABNORMAL LOW (ref 3.87–5.11)
RDW: 15.6 % — ABNORMAL HIGH (ref 11.5–15.5)
WBC: 2.9 10*3/uL — ABNORMAL LOW (ref 4.0–10.5)
nRBC: 0 % (ref 0.0–0.2)

## 2023-08-02 LAB — GLUCOSE, CAPILLARY
Glucose-Capillary: 84 mg/dL (ref 70–99)
Glucose-Capillary: 86 mg/dL (ref 70–99)

## 2023-08-02 MED ORDER — POTASSIUM CHLORIDE 20 MEQ PO PACK
20.0000 meq | PACK | Freq: Once | ORAL | Status: AC
  Administered 2023-08-02: 20 meq via ORAL
  Filled 2023-08-02: qty 1

## 2023-08-02 MED ORDER — LACTULOSE 10 GM/15ML PO SOLN
30.0000 g | Freq: Four times a day (QID) | ORAL | Status: DC
  Administered 2023-08-02 – 2023-08-03 (×3): 30 g via ORAL
  Filled 2023-08-02 (×3): qty 60

## 2023-08-02 NOTE — Evaluation (Addendum)
 Physical Therapy Evaluation Patient Details Name: Truc Winfree MRN: [id number] DOB: 05/11/1940 Today's Date: 08/02/2023  History of Present Illness  83 y/o female presented to ED on 08/01/23 for N/V and unable to eat. Admitted for possible gastritis. 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 PTA, patient lives with daughter and ambulates with RW and daughter assists with ADLs. Patient presents with weakness, impaired balance, and decreased activity tolerance. In recliner on arrival and agreeable to PT evaluation. Able to stand from recliner with CGA. Ambulated in hallway with RW and CGA for safety. Slow steady gait throughout. Patient will benefit from skilled PT services during acute stay to address listed deficits. Patient will benefit from ongoing therapy at discharge to maximize functional independence and safety.         If plan is discharge home, recommend the following: A little help with walking and/or transfers;A little help with bathing/dressing/bathroom;Assistance with cooking/housework;Assist for transportation;Help with stairs or ramp for entrance   Can travel by private vehicle        Equipment Recommendations None recommended by PT  Recommendations for Other Services       Functional Status Assessment Patient has had a recent decline in their functional status and demonstrates the ability to make significant improvements in function in a reasonable and predictable amount of time.     Precautions / Restrictions Precautions Precautions: Fall Recall of Precautions/Restrictions: Intact Restrictions Weight Bearing  Restrictions Per Provider Order: No      Mobility  Bed Mobility               General bed mobility comments: sitting in recliner pre and post session    Transfers Overall transfer level: Needs assistance Equipment used: Rolling Tysheena Ginzburg (2 wheels) Transfers: Sit to/from Stand Sit to Stand: Contact guard assist                Ambulation/Gait Ambulation/Gait assistance: Contact guard assist Gait Distance (Feet): 180 Feet Assistive device: Rolling Chastin Garlitz (2 wheels) Gait Pattern/deviations: Step-through pattern, Decreased stride length Gait velocity: decreased     General Gait Details: CGA for safety, very slow steady gait  Stairs            Wheelchair Mobility     Tilt Bed    Modified Rankin (Stroke Patients Only)       Balance Overall balance assessment: Needs assistance Sitting-balance support: No upper extremity supported, Feet supported Sitting balance-Leahy Scale: Good     Standing balance support: Bilateral upper extremity supported, During functional activity Standing balance-Leahy Scale: Fair                               Pertinent Vitals/Pain Pain Assessment Pain Assessment: Faces Faces Pain Scale: Hurts little more Pain Location: stomach Pain Descriptors / Indicators: Grimacing, Guarding Pain Intervention(s): Monitored during session, Limited activity within patient's tolerance, Repositioned    Home Living Family/patient expects to be discharged to:: Private residence Living Arrangements: Children Available Help at Discharge: Family;Available 24 hours/day Type of Home: House Home Access: Stairs to enter Entrance Stairs-Rails: Left Entrance Stairs-Number of Steps: 2   Home Layout: One level Home Equipment: Agricultural consultant (2 wheels);BSC/3in1  Prior Function Prior Level of Function : Independent/Modified Independent             Mobility Comments: MOD I + RW - has been getting HHPT ADLs Comments: daughter  assists with ADLs     Extremity/Trunk Assessment   Upper Extremity Assessment Upper Extremity Assessment: Defer to OT evaluation    Lower Extremity Assessment Lower Extremity Assessment: Generalized weakness       Communication   Communication Communication: Impaired Factors Affecting Communication: Difficulty expressing self    Cognition Arousal: Alert Behavior During Therapy: WFL for tasks assessed/performed   PT - Cognitive impairments: No family/caregiver present to determine baseline                       PT - Cognition Comments: slight word finding difficulty. Difficulty stating the month/year Following commands: Intact       Cueing       General Comments      Exercises     Assessment/Plan    PT Assessment Patient needs continued PT services  PT Problem List Decreased strength;Decreased activity tolerance;Decreased balance;Decreased mobility;Decreased safety awareness;Decreased knowledge of use of DME;Decreased knowledge of precautions       PT Treatment Interventions DME instruction;Gait training;Functional mobility training;Therapeutic activities;Therapeutic exercise;Balance training;Stair training;Neuromuscular re-education;Patient/family education    PT Goals (Current goals can be found in the Care Plan section)  Acute Rehab PT Goals Patient Stated Goal: to go home PT Goal Formulation: With patient Time For Goal Achievement: 08/16/23 Potential to Achieve Goals: Good    Frequency Min 2X/week     Co-evaluation               AM-PAC PT 6 Clicks Mobility  Outcome Measure Help needed turning from your back to your side while in a flat bed without using bedrails?: A Little Help needed moving from lying on your back to sitting on the side of a flat bed without using bedrails?: A Little Help needed moving to and from a bed to a chair (including a wheelchair)?: A Little Help needed standing up from a chair using your arms (e.g.,  wheelchair or bedside chair)?: A Little Help needed to walk in hospital room?: A Little Help needed climbing 3-5 steps with a railing? : A Little 6 Click Score: 18    End of Session   Activity Tolerance: Patient tolerated treatment well Patient left: in chair;with call bell/phone within reach Nurse Communication: Mobility status PT Visit Diagnosis: Unsteadiness on feet (R26.81);Muscle weakness (generalized) (M62.81);Difficulty in walking, not elsewhere classified (R26.2)    Time: 8879-8861 PT Time Calculation (min) (ACUTE ONLY): 18 min   Charges:   PT Evaluation $PT Eval Moderate Complexity: 1 Mod   PT General Charges $$ ACUTE PT VISIT: 1 Visit         Maryanne Finder, PT, DPT Physical Therapist - [facility]  [facility]   Jaxin Fulfer A Kashara Blocher 08/02/2023, 12:55 PM

## 2023-08-02 NOTE — Plan of Care (Signed)

## 2023-08-02 NOTE — Evaluation (Signed)
 Occupational Therapy Evaluation Patient Details Name: Annette Foster MRN: [id number] DOB: Dec 25, 1940 Today's Date: 08/02/2023   History of Present Illness   Pt is a 83 year old female admitted with intractable nausea and vomiting, mild confusion with elevated ammonia level of 60 on workup.     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 Pt is oriented to self, place, grossly to situation, not oriented to date. She follows one step directions appropriately but mild memory/attention deficits noted, will continue to assess. Pt presents with deficits in strength, endurance, activity tolerance, balance, cognition, affecting safe and optimal ADL completion. Pt performs toilet transfer with CGA, toileting with supervision after continent BM, amb with RW with CGA. Pt will benefit from acute OT to address functional deficits and to facilitate optimal ADL performance. OT will follow acutely.       If plan is discharge home, recommend the following:   A little help with walking and/or transfers;A little help with bathing/dressing/bathroom;Supervision due to cognitive status;Direct supervision/assist for medications management;Help with stairs or ramp for entrance     Functional Status Assessment   Patient has had a recent decline in their functional status and demonstrates the ability to make significant improvements in function in a reasonable and predictable amount of time.     Equipment Recommendations   None recommended by OT     Recommendations for Other  Services         Precautions/Restrictions   Precautions Precautions: Fall Recall of Precautions/Restrictions: Intact Restrictions Weight Bearing Restrictions Per Provider Order: No     Mobility Bed Mobility               General bed mobility comments: NT in recliner pre/post session    Transfers Overall transfer level: Needs assistance Equipment used: Rolling walker (2 wheels) Transfers: Sit to/from Stand Sit to Stand: Contact guard assist                  Balance Overall balance assessment: Needs assistance Sitting-balance support: No upper extremity supported, Feet supported Sitting balance-Leahy Scale: Good     Standing balance support: Bilateral upper extremity supported, During functional activity Standing balance-Leahy Scale: Fair                             ADL either performed or assessed with clinical judgement   ADL Overall ADL's : Needs assistance/impaired Eating/Feeding: Set up;Sitting   Grooming: Set up;Sitting               Lower Body Dressing: Supervision/safety;Sitting/lateral leans Lower Body Dressing Details (indicate cue type and reason): underwear Toilet Transfer: Supervision/safety;Contact guard assist;Rolling walker (2 wheels);Regular Toilet;Ambulation   Toileting- Clothing Manipulation and Hygiene: Supervision/safety;Contact guard assist Toileting - Clothing Manipulation Details (indicate cue type and reason): continent BM on toilet     Functional mobility during ADLs: Contact guard assist;Rolling walker (2 wheels) (approx 200')       Vision Patient Visual Report: No change from baseline       Perception         Praxis  Pertinent Vitals/Pain Pain Assessment Pain Assessment: Faces Faces Pain Scale: Hurts little more Pain Location: stomach Pain Descriptors / Indicators: Grimacing, Guarding Pain Intervention(s): Premedicated before session, Monitored during session, Repositioned, Limited  activity within patient's tolerance     Extremity/Trunk Assessment Upper Extremity Assessment Upper Extremity Assessment: Generalized weakness   Lower Extremity Assessment Lower Extremity Assessment: Generalized weakness       Communication Communication Communication: Impaired Factors Affecting Communication: Difficulty expressing self   Cognition Arousal: Alert Behavior During Therapy: WFL for tasks assessed/performed Cognition: Cognition impaired   Orientation impairments: Time   Memory impairment (select all impairments): Declarative long-term memory Attention impairment (select first level of impairment): Selective attention                     Following commands: Intact       Cueing  General Comments   Cueing Techniques: Verbal cues  vss   Exercises Other Exercises Other Exercises: edu re: role of OT, role of rehab, discharge recommendations   Shoulder Instructions      Home Living Family/patient expects to be discharged to:: Private residence Living Arrangements: Children Available Help at Discharge: Family;Available 24 hours/day Type of Home: House Home Access: Stairs to enter [employer] of Steps: 2 Entrance Stairs-Rails: Left Home Layout: One level     Bathroom Shower/Tub: Walk-in shower         Home Equipment: Agricultural consultant (2 wheels);BSC/3in1          Prior Functioning/Environment Prior Level of Function : Independent/Modified Independent             Mobility Comments: MOD I + RW - has been getting HHPT ADLs Comments: PRN assist for ADL from daugther, assist for IADLs    OT Problem List: Decreased activity tolerance;Decreased knowledge of use of DME or AE;Decreased strength;Impaired balance (sitting and/or standing)   OT Treatment/Interventions: Self-care/ADL training;DME and/or AE instruction;Therapeutic activities;Balance training;Therapeutic exercise;Energy conservation;Patient/family education      OT  Goals(Current goals can be found in the care plan section)   Acute Rehab OT Goals Patient Stated Goal: go home OT Goal Formulation: With patient Time For Goal Achievement: 08/16/23 Potential to Achieve Goals: Good ADL Goals Pt Will Perform Grooming: with modified independence;sitting;standing Pt Will Perform Lower Body Dressing: with modified independence;sitting/lateral leans;sit to/from stand Pt Will Transfer to Toilet: with modified independence;ambulating Pt Will Perform Toileting - Clothing Manipulation and hygiene: with modified independence;sitting/lateral leans;sit to/from stand   OT Frequency:  Min 2X/week    Co-evaluation PT/OT/SLP Co-Evaluation/Treatment: Yes Reason for Co-Treatment: To address functional/ADL transfers (tolerance)   OT goals addressed during session: ADL's and self-care      AM-PAC OT 6 Clicks Daily Activity     Outcome Measure Help from another person eating meals?: None Help from another person taking care of personal grooming?: None Help from another person toileting, which includes using toliet, bedpan, or urinal?: None Help from another person bathing (including washing, rinsing, drying)?: A Little Help from another person to put on and taking off regular upper body clothing?: None Help from another person to put on and taking off regular lower body clothing?: A Little 6 Click Score: 22   End of Session Equipment Utilized During Treatment: Rolling walker (2 wheels) Nurse Communication: Mobility status  Activity Tolerance: Patient tolerated treatment well Patient left: Other (comment) (in hallway in care of PT)  OT Visit Diagnosis: Other abnormalities of gait and mobility (R26.89);Muscle weakness (generalized) (M62.81)  Time: 8879-8864 OT Time Calculation (min): 15 min Charges:  OT General Charges $OT Visit: 1 Visit OT Evaluation $OT Eval Low Complexity: 1 Low  Therisa Sheffield, OTD OTR/L  08/02/23, 1:06 PM

## 2023-08-02 NOTE — Progress Notes (Addendum)
 PROGRESS NOTE  Annette Foster    DOB: 11-10-1940, 83 y.o.  FMW:[id number]    Code Status: Full Code   DOA: 08/01/2023   LOS: 0   Brief hospital course  Annette Foster is a 83 y.o. 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 CT abdomen and pelvis showed no acute process.  Did show a large amount of stool, cirrhotic liver with TIPS shunt and multifocal patchy airspace opacities bilateral lung bases however decreased from prior. Patient treated with multiple antiemetics including promethazine , Zofran  and she was also given an NS bolus.  Patient was admitted to medicine service for further workup and management of intractable N/V as outlined in detail below.  08/02/23 -improved  Assessment & Plan  Principal Problem:   Intractable vomiting with nausea Active Problems:   Essential hypertension   Hyperammonemia (HCC)  Intractable nausea and vomiting- improving CT abdomen and pelvis nonacute Possibly gastritis, constipation as seen on CT abdomen and pelvis Will treat with Protonix  and IV antiemetics - CLD currently- advance as tolerated Follow UA  Large stool burden- as seen on CT. no signs of obstruction. Had BM this am.  - increasing lactulose  frequency    Hyperammonemia - Possible mildly decompensated liver cirrhosis History of TIPS 4/28 CT abdomen nonacute - being held due to soft BP are nadolol , spironolactone , torsemide  which should be titrated back on for their secondary benefits with cirrhosis if  tolerating Lactulose  3 times daily increased. Patient with mild confusion but answers most questions appropriately.  Continue rifaximin    Pancytopenia (HCC):  -stable  Mild hypokalemia- relpaced, monitor   Hypothyroidism -Synthroid    Hypotension:  -Blood pressure is soft, holding nadolol , spironolactone , torsemide  - Continue midodrine  5 mg 3 times daily   Chronic heart failure with preserved ejection fraction (HFpEF) (HCC):  -2D echo on 10/07/2022 showed EF of 55 to 60% with grade 2    HLD (hyperlipidemia) - Fenofibrate  and zocor    Diabetes mellitus without complication (HCC): Recent A1c 5.6, well-controlled.  Patient taking NovoLog , monitor, Tresiba  200 unit daily -Sliding scale insulin  - Glargine insulin    OSA - on CPAP    Anxiety - Start Xanax  0.25 mg twice daily as needed  Body mass index is 22.83 kg/m.  VTE ppx: enoxaparin  (LOVENOX ) injection 40 mg Start: 08/01/23 2200  Diet:     Diet   Diet clear liquid Room service appropriate? Yes; Fluid consistency: Thin   Consultants: None   Subjective 08/02/23    Pt reports feeling improved. Endorses feeling confused brain fog.    Objective  Blood pressure (!) 110/49, pulse 66, temperature 97.9 F (36.6 C), temperature source Oral, resp. rate 18, height 5' 3 (1.6 m), weight 58.5 kg, SpO2 98%.  Intake/Output Summary (Last 24 hours) at 08/02/2023 0709 Last data filed at 08/02/2023 9378 Gross per 24 hour  Intake 339.46 ml  Output --  Net 339.46 ml   Filed Weights   08/01/23 1236 08/01/23 2313  Weight: 56.9 kg 58.5 kg    Physical Exam:  General: awake, alert, NAD HEENT: atraumatic, clear conjunctiva, anicteric sclera, MMM, hearing grossly normal Respiratory: normal respiratory  effort. Cardiovascular: quick capillary refill, normal S1/S2, RRR, no JVD, murmurs Gastrointestinal: soft, NT, ND Nervous: A&O x3. Difficulty with word-finding, slow responses Extremities: moves all equally, no edema, normal  tone Skin: dry, intact, normal temperature, normal color. No rashes, lesions or ulcers on exposed skin Psychiatry: normal mood, congruent affect  Labs   I have personally reviewed the following labs and imaging studies CBC    Component Value Date/Time   WBC 2.9 (L) 08/02/2023 0430   RBC 2.84 (L) 08/02/2023 0430   HGB 9.3 (L) 08/02/2023 0430   HGB 11.9 (L) 07/14/2023 1418   HGB 11.9 04/26/2021 1439   HCT 26.0 (L) 08/02/2023 0430   HCT 34.3 04/26/2021 1439   PLT 89 (L) 08/02/2023 0430   PLT 107 (L) 07/14/2023 1418   PLT 75 (LL) 04/26/2021 1439   MCV 91.5 08/02/2023 0430   MCV 88 04/26/2021 1439   MCH 32.7 08/02/2023 0430   MCHC 35.8 08/02/2023 0430   RDW 15.6 (H) 08/02/2023 0430   RDW 14.2 04/26/2021 1439   LYMPHSABS 1.2 08/01/2023 1325   LYMPHSABS 0.8 04/26/2021 1439   MONOABS 0.3 08/01/2023 1325   EOSABS 0.2 08/01/2023 1325   EOSABS 0.1 04/26/2021 1439   BASOSABS 0.0 08/01/2023 1325   BASOSABS 0.0 04/26/2021 1439      Latest Ref Rng & Units 08/02/2023    4:30 AM 08/01/2023    1:25 PM 07/17/2023   11:46 AM  BMP  Glucose 70 - 99 mg/dL 80  862  872   BUN 8 - 23 mg/dL 11  14  13    Creatinine 0.44 - 1.00 mg/dL 9.44  9.32  9.09   Sodium 135 - 145 mmol/L 137  134  135   Potassium 3.5 - 5.1 mmol/L 3.4  3.5  3.9   Chloride 98 - 111 mmol/L 111  103  105   CO2 22 - 32 mmol/L 22  24  22    Calcium 8.9 - 10.3 mg/dL 8.2  8.4  8.9     CT ABDOMEN PELVIS W CONTRAST Result Date: 08/01/2023 CLINICAL DATA:  Acute abdominal pain EXAM: CT ABDOMEN AND PELVIS WITH CONTRAST TECHNIQUE: Multidetector CT imaging of the abdomen and pelvis was performed using the standard protocol following bolus administration of intravenous contrast. RADIATION DOSE REDUCTION: This exam was performed according to the departmental dose-optimization program which includes automated exposure control, adjustment of the mA and/or kV according to patient size and/or use of iterative reconstruction technique. CONTRAST:   OMNIPAQUE  IOHEXOL  300 MG/ML  SOLN COMPARISON:  CT abdomen and pelvis 06/15/2023 FINDINGS: Lower chest: There are multifocal patchy airspace opacities in the bilateral lung bases, right greater than left which have decreased from prior. Hepatobiliary: Patient is status post cholecystectomy. There is no biliary ductal dilatation. Tip shunt present which is grossly patent. There is nodular liver contour. No focal liver lesions are identified. Pancreas: Unremarkable. No pancreatic ductal dilatation or surrounding inflammatory changes. Spleen: The spleen is mildly enlarged, unchanged. Adrenals/Urinary Tract: Adrenal glands are unremarkable. Kidneys are normal, without renal calculi, focal lesion, or hydronephrosis. Bladder is unremarkable. Stomach/Bowel: Stomach is within normal limits. No evidence of bowel wall thickening, distention, or inflammatory changes. There is a large amount of stool throughout the colon. The appendix is not visualized Vascular/Lymphatic: Aortic atherosclerosis. No enlarged abdominal or pelvic lymph nodes. Reproductive: Status post hysterectomy. No adnexal masses. Other: No abdominal wall hernia or abnormality. No abdominopelvic ascites. Musculoskeletal: No fracture is seen. IMPRESSION: 1. No acute localizing process in  the abdomen or pelvis. 2. Large amount of stool throughout the colon. 3. Cirrhotic liver with TIP shunt present. 4. Mild splenomegaly. 5. Multifocal patchy airspace opacities in the bilateral lung bases, right greater than left, decreased from prior. 6. Aortic atherosclerosis. Aortic Atherosclerosis (ICD10-I70.0). Electronically Signed   By: Greig Pique M.D.   On: 08/01/2023 18:02    Disposition Plan & Communication  Patient status: Observation  Admitted From: Home Planned disposition location: Home Anticipated discharge date: 6/22 pending clinical improvement, ammonia resolution   Family Communication: none at bedside    Author: Marien LITTIE Piety, DO [facility] 08/02/2023, 7:09 AM   Available by Epic secure chat 7AM-7PM. If 7PM-7AM, please contact night-coverage.  [facility] contact information found on [email or web address].

## 2023-08-02 NOTE — TOC CM/SW Note (Signed)
 Patient is active with [facility] for PT and OT services.  Kalleigh Harbor, LCSW Transitions of Care Department [phone]

## 2023-08-03 DIAGNOSIS — K7469 Other cirrhosis of liver: Secondary | ICD-10-CM | POA: Diagnosis not present

## 2023-08-03 DIAGNOSIS — A084 Viral intestinal infection, unspecified: Secondary | ICD-10-CM | POA: Diagnosis not present

## 2023-08-03 DIAGNOSIS — K5900 Constipation, unspecified: Secondary | ICD-10-CM | POA: Diagnosis not present

## 2023-08-03 DIAGNOSIS — R112 Nausea with vomiting, unspecified: Secondary | ICD-10-CM | POA: Diagnosis not present

## 2023-08-03 LAB — COMPREHENSIVE METABOLIC PANEL WITH GFR
ALT: 29 U/L (ref 0–44)
AST: 60 U/L — ABNORMAL HIGH (ref 15–41)
Albumin: 2.4 g/dL — ABNORMAL LOW (ref 3.5–5.0)
Alkaline Phosphatase: 40 U/L (ref 38–126)
Anion gap: 4 — ABNORMAL LOW (ref 5–15)
BUN: 11 mg/dL (ref 8–23)
CO2: 21 mmol/L — ABNORMAL LOW (ref 22–32)
Calcium: 8.4 mg/dL — ABNORMAL LOW (ref 8.9–10.3)
Chloride: 113 mmol/L — ABNORMAL HIGH (ref 98–111)
Creatinine, Ser: 0.56 mg/dL (ref 0.44–1.00)
GFR, Estimated: >60 mL/min (ref >60)
Glucose, Bld: 69 mg/dL — ABNORMAL LOW (ref 70–99)
Potassium: 3.6 mmol/L (ref 3.5–5.1)
Sodium: 138 mmol/L (ref 135–145)
Total Bilirubin: 2.2 mg/dL — ABNORMAL HIGH (ref 0.0–1.2)
Total Protein: 5.8 g/dL — ABNORMAL LOW (ref 6.5–8.1)

## 2023-08-03 LAB — GLUCOSE, CAPILLARY: Glucose-Capillary: 82 mg/dL (ref 70–99)

## 2023-08-03 LAB — AMMONIA: Ammonia: 41 umol/L — ABNORMAL HIGH (ref 9–35)

## 2023-08-03 MED ORDER — LACTULOSE 10 GM/15ML PO SOLN: 30.0000 g | Freq: Four times a day (QID) | ORAL | Status: DC

## 2023-08-03 MED ORDER — SPIRONOLACTONE 50 MG PO TABS: 50.0000 mg | ORAL_TABLET | Freq: Every day | ORAL | Status: DC

## 2023-08-03 MED ORDER — ONDANSETRON 4 MG PO TBDP: 4.0000 mg | ORAL_TABLET | Freq: Three times a day (TID) | ORAL | 0 refills | Status: AC | PRN

## 2023-08-03 NOTE — TOC CM/SW Note (Signed)
 CSW notified Darleene with [facility] of DC today.  Shiloh Southern, LCSW Transitions of Care Department [phone]

## 2023-08-03 NOTE — Discharge Instructions (Signed)
 I'm glad you're feeling better.  Please review your medication list very carefully since I made several changes- I stopped your insulin  because during your stay with us  your blood sugars were low and did not need any insulin , however, you were not eating much when you were sick so your blood sugars may start to go back up as you recover and you can increase your insulin  as needed. Please follow up with your PCP within 1-2 weeks to recheck how much you may need.  I also stopped nadolol  and torsemide  for now because your blood pressures were low. These medications are helpful for you to take so I recommend trying to restart them once you're fully recovered and your blood pressure may increase again. Please ask your PCP to check your blood pressure and discuss restarting those medications.  I sent dissolvable zofran  to your pharmacy

## 2023-08-03 NOTE — Progress Notes (Signed)
 Subjective:  Patient ID: Annette Foster, female    DOB: February 09, 1941,  MRN: [id number]  83 y.o. female presents with at risk foot care with history of diabetic neuropathy and painful thick toenails that are difficult to trim. Pain interferes with ambulation. Aggravating factors include wearing enclosed shoe gear. Pain is relieved with periodic professional debridement. Chief Complaint  Patient presents with   Nail Problem    Pt is here for [facility] last A1C was 6 PCP is Dr Alla and LOV was in early June.     PCP: Alla Amis, MD.  New problem(s): None.   Review of Systems: Negative except as noted in the HPI.   Allergies  Allergen Reactions   Codeine Itching, Nausea And Vomiting and Other (See Comments)   Demeclocycline Rash   Demerol [Meperidine] Itching and Nausea And Vomiting   Hydrocodone  Itching and Nausea And Vomiting   Morphine  Nausea Only   Other Other (See Comments)   Oxycodone  Itching and Nausea And Vomiting   Pentazocine Itching, Nausea And Vomiting and Other (See Comments)   Tetracyclines & Related Rash   Coal Tar Extract Other (See Comments)   Hydrocodone -Acetaminophen  Other (See Comments)   Salicylic Acid Other (See Comments)   Tetracycline Hcl Other (See Comments)    Objective:  There were no vitals filed for this visit. Constitutional Patient is a pleasant 83 y.o. female WD, WN in NAD. AAO x 3.  Vascular Capillary fill time to digits <3 seconds.  DP/PT pulse(s) are faintly palpable b/l lower extremities. Pedal hair absent b/l. Lower extremity skin temperature gradient warm to cool b/l. No pain with calf compression b/l. No cyanosis or clubbing noted. No ischemia nor gangrene noted b/l.   Neurologic Protective sensation intact 5/5 intact bilaterally with 10g monofilament b/l. Vibratory sensation intact b/l. No clonus b/l. Pt has subjective symptoms of neuropathy.  Dermatologic Pedal skin is thin, shiny and atrophic b/l.  No open wounds b/l lower  extremities. No interdigital macerations b/l lower extremities. Toenails 1-5 b/l elongated, discolored, dystrophic, thickened, crumbly with subungual debris and tenderness to dorsal palpation. No hyperkeratotic nor porokeratotic lesions present on today's visit.  Orthopedic: Normal muscle strength 5/5 to all lower extremity muscle groups bilaterally. HAV with bunion deformity noted b/l LE. Hammertoe(s) 2-5 b/l.   Last HgA1c:     Latest Ref Rng & Units 03/03/2023   11:25 AM 09/09/2022   12:04 AM  Hemoglobin A1C  Hemoglobin-A1c 4.8 - 5.6 % 5.6  7.2      Assessment:   1. Pain due to onychomycosis of toenails of both feet   2. Diabetic polyneuropathy associated with type 2 diabetes mellitus (HCC)    Plan:  Patient was evaluated and treated. All patient's and/or POA's questions/concerns addressed on today's visit. Mycotic toenails 1-5 debrided in length and girth without incident.  Continue daily foot inspections and monitor blood glucose per PCP/Endocrinologist's recommendations.Continue soft, supportive shoe gear daily. Report any pedal injuries to medical professional. Call office if there are any quesitons/concerns. Return in about 3 months (around 10/28/2023).  Delon LITTIE Merlin, DPM      [facility] LOCATION: [address], [place] [address]  Office [phone]   [facility] LOCATION: [address] [place], [place] [address] Office [phone]

## 2023-08-03 NOTE — Progress Notes (Incomplete)
 PROGRESS NOTE  Annette Foster    DOB: 1940/08/08, 83 y.o.  FMW:[id number]    Code Status: Full Code   DOA: 08/01/2023   LOS: 1   Brief hospital course  Annette Foster is a 83 y.o. 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 CT abdomen and pelvis showed no acute process.  Did show a large amount of stool, cirrhotic liver with TIPS shunt and multifocal patchy airspace opacities bilateral lung bases however decreased from prior. Patient treated with multiple antiemetics including promethazine , Zofran  and she was also given an NS bolus.  Patient was admitted to medicine service for further workup and management of intractable N/V as outlined in detail below.  08/03/23 -improved  Assessment & Plan  Principal Problem:   Intractable vomiting with nausea Active Problems:   Essential hypertension   Hyperammonemia (HCC)   Intractable nausea and vomiting  Intractable nausea and vomiting- improving CT abdomen and pelvis nonacute Possibly gastritis, constipation as seen on CT abdomen and pelvis Will treat with Protonix  and IV antiemetics - CLD currently- advance as tolerated Follow UA  Large stool burden- as seen on CT. no signs of obstruction. Has been having several BMs - increased lactulose  frequency    Hyperammonemia - Possible mildly decompensated liver cirrhosis History of TIPS 4/28 CT abdomen nonacute - being held due to soft BP are nadolol , spironolactone , torsemide  which should be titrated back on for their  secondary benefits with cirrhosis if tolerating Lactulose  3 times daily increased. Patient with mild confusion but answers most questions appropriately.  Continue rifaximin    Pancytopenia (HCC):  -stable  Mild hyperkalemia- relpaced, monitor   Hypothyroidism -Synthroid    Hypotension:  -Blood pressure is soft, holding nadolol , spironolactone , torsemide  - Continue midodrine  5 mg 3 times daily   Chronic heart failure with preserved ejection fraction (HFpEF) (HCC):  -2D echo on 10/07/2022 showed EF of 55 to 60% with grade 2    HLD (hyperlipidemia) - Fenofibrate  and zocor    Diabetes mellitus without complication (HCC): Recent A1c 5.6, well-controlled.  Patient taking NovoLog , monitor, Tresiba  200 unit daily -Sliding scale insulin  - Glargine insulin    OSA - on CPAP    Anxiety - Start Xanax  0.25 mg twice daily as needed  Body mass index is 22.83 kg/m.  VTE ppx: enoxaparin  (LOVENOX ) injection 40 mg Start: 08/01/23 2200  Diet:     Diet   Diet clear liquid Room service appropriate? Yes; Fluid consistency: Thin   Consultants: None   Subjective 08/03/23    Pt reports feeling improved. Endorses feeling confused brain fog.    Objective  Blood pressure (!) 110/49, pulse 66, temperature 97.9 F (36.6 C), temperature source Oral, resp. rate 18, height 5' 3 (1.6 m), weight 58.5 kg, SpO2 98%.  Intake/Output Summary (Last 24 hours) at 08/03/2023 0733 Last data filed at 08/02/2023 1911 Gross per 24 hour  Intake 1260 ml  Output --  Net 1260 ml   Filed Weights   08/01/23 1236 08/01/23 2313  Weight: 56.9 kg 58.5 kg    Physical Exam:  General: awake, alert, NAD HEENT: atraumatic, clear conjunctiva, anicteric sclera, MMM,  hearing grossly normal Respiratory: normal respiratory effort. Cardiovascular: quick capillary refill, normal S1/S2, RRR, no JVD, murmurs Gastrointestinal: soft, NT, ND Nervous: A&O x3. Difficulty with word-finding, slow responses Extremities: moves all  equally, no edema, normal tone Skin: dry, intact, normal temperature, normal color. No rashes, lesions or ulcers on exposed skin Psychiatry: normal mood, congruent affect  Labs   I have personally reviewed the following labs and imaging studies CBC    Component Value Date/Time   WBC 2.9 (L) 08/02/2023 0430   RBC 2.84 (L) 08/02/2023 0430   HGB 9.3 (L) 08/02/2023 0430   HGB 11.9 (L) 07/14/2023 1418   HGB 11.9 04/26/2021 1439   HCT 26.0 (L) 08/02/2023 0430   HCT 34.3 04/26/2021 1439   PLT 89 (L) 08/02/2023 0430   PLT 107 (L) 07/14/2023 1418   PLT 75 (LL) 04/26/2021 1439   MCV 91.5 08/02/2023 0430   MCV 88 04/26/2021 1439   MCH 32.7 08/02/2023 0430   MCHC 35.8 08/02/2023 0430   RDW 15.6 (H) 08/02/2023 0430   RDW 14.2 04/26/2021 1439   LYMPHSABS 1.2 08/01/2023 1325   LYMPHSABS 0.8 04/26/2021 1439   MONOABS 0.3 08/01/2023 1325   EOSABS 0.2 08/01/2023 1325   EOSABS 0.1 04/26/2021 1439   BASOSABS 0.0 08/01/2023 1325   BASOSABS 0.0 04/26/2021 1439      Latest Ref Rng & Units 08/03/2023    5:30 AM 08/02/2023    4:30 AM 08/01/2023    1:25 PM  BMP  Glucose 70 - 99 mg/dL 69  80  862   BUN 8 - 23 mg/dL 11  11  14    Creatinine 0.44 - 1.00 mg/dL 9.43  9.44  9.32   Sodium 135 - 145 mmol/L 138  137  134   Potassium 3.5 - 5.1 mmol/L 3.6  3.4  3.5   Chloride 98 - 111 mmol/L 113  111  103   CO2 22 - 32 mmol/L 21  22  24    Calcium 8.9 - 10.3 mg/dL 8.4  8.2  8.4     CT ABDOMEN PELVIS W CONTRAST Result Date: 08/01/2023 CLINICAL DATA:  Acute abdominal pain EXAM: CT ABDOMEN AND PELVIS WITH CONTRAST TECHNIQUE: Multidetector CT imaging of the abdomen and pelvis was performed using the standard protocol following bolus administration of intravenous contrast. RADIATION DOSE REDUCTION: This exam was performed according to the departmental dose-optimization program which includes automated exposure control, adjustment of the mA and/or kV according to patient size and/or use of iterative reconstruction  technique. CONTRAST:  OMNIPAQUE  IOHEXOL  300 MG/ML  SOLN COMPARISON:  CT abdomen and pelvis 06/15/2023 FINDINGS: Lower chest: There are multifocal patchy airspace opacities in the bilateral lung bases, right greater than left which have decreased from prior. Hepatobiliary: Patient is status post cholecystectomy. There is no biliary ductal dilatation. Tip shunt present which is grossly patent. There is nodular liver contour. No focal liver lesions are identified. Pancreas: Unremarkable. No pancreatic ductal dilatation or surrounding inflammatory changes. Spleen: The spleen is mildly enlarged, unchanged. Adrenals/Urinary Tract: Adrenal glands are unremarkable. Kidneys are normal, without renal calculi, focal lesion, or hydronephrosis. Bladder is unremarkable. Stomach/Bowel: Stomach is within normal limits. No evidence of bowel wall thickening, distention, or inflammatory changes. There is a large amount of stool throughout the colon. The appendix is not visualized Vascular/Lymphatic: Aortic atherosclerosis. No enlarged abdominal or pelvic lymph nodes. Reproductive: Status post hysterectomy. No adnexal masses. Other: No abdominal wall hernia or abnormality. No abdominopelvic ascites. Musculoskeletal: No fracture is seen.  IMPRESSION: 1. No acute localizing process in the abdomen or pelvis. 2. Large amount of stool throughout the colon. 3. Cirrhotic liver with TIP shunt present. 4. Mild splenomegaly. 5. Multifocal patchy airspace opacities in the bilateral lung bases, right greater than left, decreased from prior. 6. Aortic atherosclerosis. Aortic Atherosclerosis (ICD10-I70.0). Electronically Signed   By: Greig Pique M.D.   On: 08/01/2023 18:02    Disposition Plan & Communication  Patient status: Inpatient  Admitted From: Home Planned disposition location: Home Anticipated discharge date: 6/22 pending clinical improvement, ammonia resolution   Family Communication: none at bedside    Author: Marien LITTIE Piety, DO [facility] 08/03/2023, 7:33 AM   Available by Epic secure chat 7AM-7PM. If 7PM-7AM, please contact night-coverage.  [facility] contact information found on [email or web address].

## 2023-08-03 NOTE — Discharge Summary (Signed)
 Physician Discharge Summary  Patient: Annette Foster FMW:[id number] DOB: 10-04-1940   Code Status: Full Code Admit date: 08/01/2023 Discharge date: 08/03/2023 Disposition: Home health, PT, OT, nurse aid, and RN PCP: Alla Amis, MD  Recommendations for Outpatient Follow-up:  Follow up with PCP within 1-2 weeks Regarding general hospital follow up and preventative care Recommend reviewing BP as she is on several BP lowering agents for the treatment of her liver disease which she has tolerated in the past but had hypotension while inpatient so temporarily held. May could restart.  Follow up with hepatologist   Discharge Diagnoses:  Principal Problem:   Intractable vomiting with nausea Active Problems:   Essential hypertension   Hyperammonemia (HCC)   Intractable nausea and vomiting   Constipation  Brief Hospital Course Summary: Annette Foster is a 83 y.o. 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 CT abdomen and pelvis showed no acute process.  Did show a large amount of stool, cirrhotic liver with TIPS shunt and multifocal patchy airspace opacities bilateral lung bases however decreased from prior. Patient treated with multiple antiemetics including promethazine , Zofran  and she was also given an NS bolus.   Her N/V resolved with supportive care and inconclusive etiology but most consistent with acute viral gastritis. She was tolerating a normal diet without needing antiemetic aids prior to dc.   Lactulose  frequency was increased given large stool burden on imaging and mildly elevated ammonia which improved both conditions. She was having 3+ BM per day and cognitively returned to baseline. Ammonia decreased to 41 on day of discharge.   Unfortunately, her BP was not able to tolerate continuation of all of her preventative medications for cirrhosis so they were held in acute setting and may be titrated back on with follow up visits.   All other chronic conditions were treated with home medications.    Discharge Condition: Stable, improved Recommended discharge diet: Regular healthy diet  Consultations: None   Procedures/Studies: None   Allergies as of 08/03/2023       Reactions   Codeine Itching, Nausea And Vomiting, Other (See Comments)   Demeclocycline Rash   Demerol [meperidine] Itching, Nausea And Vomiting   Hydrocodone  Itching, Nausea And Vomiting   Morphine  Nausea Only   Other Other (See Comments)   Oxycodone  Itching, Nausea And Vomiting   Pentazocine Itching, Nausea And Vomiting, Other (See Comments)   Tetracyclines & Related Rash   Coal Tar Extract Other (See Comments)   Hydrocodone -acetaminophen  Other (See Comments)   Salicylic Acid Other (See Comments)   Tetracycline Hcl Other (See Comments)        Medication List     PAUSE taking these medications    nadolol  20 MG tablet Wait to take this until your doctor or other care provider tells you to start again. Commonly known as: CORGARD  Take 1 tablet (20 mg total) by mouth daily.   torsemide  20 MG tablet Wait to take this until your doctor or other care provider tells you to start again. Commonly known as: DEMADEX  Take 3 tablets (60 mg total) by mouth daily.  STOP taking these medications    albuterol  108 (90 Base) MCG/ACT inhaler Commonly known as: VENTOLIN  HFA   [employer] POTASSIUM GLUCONATE PO   insulin  aspart 100 UNIT/ML FlexPen Commonly known as: NOVOLOG    ipratropium 0.03 % nasal  spray Commonly known as: ATROVENT   Tresiba  FlexTouch 200 UNIT/ML FlexTouch Pen Generic drug: insulin  degludec       TAKE these medications    azelastine  0.05 % ophthalmic solution Commonly known as: OPTIVAR  1 drop into affected eye Ophthalmic Twice a day; Duration: 30 day   clobetasol  ointment 0.05 % Commonly known as: TEMOVATE  Apply 1 application topically as needed (vaginal irritation).   clotrimazole  1 % cream Commonly known as: LOTRIMIN  Apply 1 Application topically 2 (two) times daily as needed.   cyanocobalamin 1000 MCG/ML injection Commonly known as: VITAMIN B12 1,000 mcg every 30 (thirty) days.   EPINEPHrine  0.3 mg/0.3 mL Soaj injection Commonly known as: EPI-PEN Inject 0.3 mg into the muscle.   esomeprazole 40 MG capsule Commonly known as: NEXIUM Take 40 mg by mouth 2 (two) times daily before a meal.   fenofibrate  160 MG tablet Take 160 mg by mouth at bedtime.   gabapentin  300 MG capsule Commonly known as: NEURONTIN  Take 300 mg by mouth 2 (two) times daily.   hydrOXYzine  25 MG tablet Commonly known as: ATARAX  1 tablet as needed Orally every 8 hrs; Duration: 30 day(s)   lactulose  10 GM/15ML solution Commonly known as: CHRONULAC  Take 45 mLs (30 g total) by mouth every 6 (six) hours. What changed: when to take this   levocetirizine 5 MG tablet Commonly known as: XYZAL Take 5 mg by mouth every evening.   levothyroxine  50 MCG tablet Commonly known as: SYNTHROID  Take 50 mcg by mouth. Take 1 tablet (50 mcg total) by mouth once daily What changed: Another medication with the same name was removed. Continue taking this medication, and follow the directions you see here.   magnesium  oxide 400 MG tablet Commonly known as: MAG-OX Take 400 mg by mouth 2 (two) times daily.   midodrine  10 MG tablet Commonly known as: PROAMATINE  Take 1 tablet (10 mg total) by mouth 3 (three) times daily with meals.   Mounjaro  15 MG/0.5ML Pen Generic drug:  tirzepatide  Inject 15 mg into the skin once a week. On Fridays   ondansetron  4 MG disintegrating tablet Commonly known as: ZOFRAN -ODT Take 1 tablet (4 mg total) by mouth every 8 (eight) hours as needed for nausea or vomiting.   ondansetron  4 MG tablet Commonly known as: ZOFRAN  Take 1 tablet (4 mg total) by mouth every 8 (eight) hours as needed for nausea.   ondansetron  8 MG tablet Commonly known as: ZOFRAN  Take 1 tablet (8 mg total) by mouth every 8 (eight) hours as needed for nausea or vomiting.   potassium chloride  20 MEQ packet Commonly known as: KLOR-CON  1 packet with food Orally Once a day; Duration: 30 day(s)   rifaximin  550 MG Tabs tablet Commonly known as: XIFAXAN  Take 550 mg by mouth. take 1 tablet (550 mg total) by mouth 2 (two) times daily for 120 days   saccharomyces boulardii 250 MG capsule Commonly known as: FLORASTOR Take 250 mg by mouth. Take 250 mg by mouth 2 (two) times daily   simvastatin  20 MG tablet Commonly known as: ZOCOR  Take 20 mg by mouth at bedtime.   spironolactone  50 MG tablet Commonly known as: ALDACTONE  Take 1 tablet (50 mg total) by mouth daily. What changed: how much to take  traMADol  50 MG tablet Commonly known as: ULTRAM  Take 1 tablet (50 mg total) by mouth every 8 (eight) hours as needed. What changed: reasons to take this   valACYclovir  500 MG tablet Commonly known as: VALTREX  Take 500 mg by mouth 2 (two) times daily as needed.   Vitamin D  50 MCG (2000 UT) Caps Take 2,000 Units by mouth daily.        Follow-up Information     Alla Amis, MD. Schedule an appointment as soon as possible for a visit in 1 week(s).   Specialty: Family Medicine Contact information: [address] [facility] [place] [address] [phone]                Subjective   Pt reports feeling well today. She is tolerating ambulation close to baseline and has no problems with eating. Denies nausea. Had about 4 Bms  yesterday.   All questions and concerns were addressed at time of discharge.  Objective  Blood pressure (!) 124/51, pulse 65, temperature 98.4 F (36.9 C), resp. rate 18, height 5' 3 (1.6 m), weight 58.5 kg, SpO2 99%.   General: Pt is alert, awake, not in acute distress Cardiovascular: RRR, S1/S2 +, no rubs, no gallops Respiratory: CTA bilaterally, no wheezing, no rhonchi Abdominal: Soft, NT, ND, bowel sounds + Extremities: no edema, no cyanosis  The results of significant diagnostics from this hospitalization (including imaging, microbiology, ancillary and laboratory) are listed below for reference.   Imaging studies: CT ABDOMEN PELVIS W CONTRAST Result Date: 08/01/2023 CLINICAL DATA:  Acute abdominal pain EXAM: CT ABDOMEN AND PELVIS WITH CONTRAST TECHNIQUE: Multidetector CT imaging of the abdomen and pelvis was performed using the standard protocol following bolus administration of intravenous contrast. RADIATION DOSE REDUCTION: This exam was performed according to the departmental dose-optimization program which includes automated exposure control, adjustment of the mA and/or kV according to patient size and/or use of iterative reconstruction technique. CONTRAST:  OMNIPAQUE  IOHEXOL  300 MG/ML  SOLN COMPARISON:  CT abdomen and pelvis 06/15/2023 FINDINGS: Lower chest: There are multifocal patchy airspace opacities in the bilateral lung bases, right greater than left which have decreased from prior. Hepatobiliary: Patient is status post cholecystectomy. There is no biliary ductal dilatation. Tip shunt present which is grossly patent. There is nodular liver contour. No focal liver lesions are identified. Pancreas: Unremarkable. No pancreatic ductal dilatation or surrounding inflammatory changes. Spleen: The spleen is mildly enlarged, unchanged. Adrenals/Urinary Tract: Adrenal glands are unremarkable. Kidneys are normal, without renal calculi, focal lesion, or hydronephrosis. Bladder is  unremarkable. Stomach/Bowel: Stomach is within normal limits. No evidence of bowel wall thickening, distention, or inflammatory changes. There is a large amount of stool throughout the colon. The appendix is not visualized Vascular/Lymphatic: Aortic atherosclerosis. No enlarged abdominal or pelvic lymph nodes. Reproductive: Status post hysterectomy. No adnexal masses. Other: No abdominal wall hernia or abnormality. No abdominopelvic ascites. Musculoskeletal: No fracture is seen. IMPRESSION: 1. No acute localizing process in the abdomen or pelvis. 2. Large amount of stool throughout the colon. 3. Cirrhotic liver with TIP shunt present. 4. Mild splenomegaly. 5. Multifocal patchy airspace opacities in the bilateral lung bases, right greater than left, decreased from prior. 6. Aortic atherosclerosis. Aortic Atherosclerosis (ICD10-I70.0). Electronically Signed   By: Greig Pique M.D.   On: 08/01/2023 18:02   IR Radiologist Eval & Mgmt Result Date: 07/22/2023 EXAM: ESTABLISHED PATIENT OFFICE VISIT CHIEF COMPLAINT: SEE NOTE IN EPIC HISTORY OF PRESENT ILLNESS: SEE NOTE IN EPIC REVIEW OF SYSTEMS: SEE  NOTE IN EPIC PHYSICAL EXAMINATION: SEE NOTE IN EPIC ASSESSMENT AND PLAN: SEE NOTE IN EPIC Electronically Signed   By: Wilkie Lent M.D.   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 She underwent tips creation on 06/09/2023. EXAM: DUPLEX ULTRASOUND OF LIVER AND TIPS SHUNT TECHNIQUE: Color and duplex Doppler ultrasound was performed to evaluate the hepatic in-flow and out-flow vessels. COMPARISON:  None Available. FINDINGS: Portal Vein Velocities Main:  70 cm/sec Right:  29 cm/sec Left:  12 cm/sec TIPS Stent Velocities Proximal:  116 cm/sec Mid:  183 Distal:  137 cm/sec IVC: Present and patent with normal respiratory phasicity. Hepatic Vein Velocities Right:  38  cm/sec Mid:  137 cm/sec Left:  28 cm/sec Splenic Vein: 72 cm per second Superior Mesenteric Vein: 71 cm/second Hepatic Artery: 173 cm/second Ascites: Absent Varices: Absent Other findings: Heterogeneous and coarsened hepatic parenchyma with a nodular contour consistent with known underlying history of hepatic cirrhosis. IMPRESSION: 1. Patent middle hepatic to right portal venous TIPS without evidence of in stent stenosis or occlusion. 2. No evidence of recurrent ascites. 3. Hepatic cirrhosis. Electronically Signed   By: Wilkie Lent M.D.   On: 07/17/2023 09:31    Labs: Basic Metabolic Panel: Recent Labs  Lab 08/01/23 1325 08/02/23 0430 08/03/23 0530  NA 134* 137 138  K 3.5 3.4* 3.6  CL 103 111 113*  CO2 24 22 21*  GLUCOSE 137* 80 69*  BUN 14 11 11   CREATININE 0.67 0.55 0.56  CALCIUM 8.4* 8.2* 8.4*  MG 2.0  --   --    CBC: Recent Labs  Lab 08/01/23 1325 08/02/23 0430  WBC 3.8* 2.9*  NEUTROABS 2.1  --   HGB 11.4* 9.3*  HCT 32.2* 26.0*  MCV 92.8 91.5  PLT 105* 89*   Microbiology: Results for orders placed or performed during the hospital encounter of 06/28/23  Blood Culture (routine x 2)     Status: None   Collection Time: 06/28/23  9:15 PM   Specimen: BLOOD  Result Value Ref Range Status   Specimen Description BLOOD RIGHT WRIST  Final   Special Requests   Final    BOTTLES DRAWN AEROBIC AND ANAEROBIC Blood Culture results may not be optimal due to an inadequate volume of blood received in culture bottles   Culture   Final    NO GROWTH 5 DAYS Performed at [facility], [address] [place], [place] [address]    Report Status 07/03/2023 FINAL  Final  Blood Culture (routine x 2)     Status: None   Collection Time: 06/28/23  9:30 PM   Specimen: BLOOD  Result Value Ref Range Status   Specimen Description BLOOD RIGHT FOREARM  Final   Special Requests   Final    BOTTLES DRAWN AEROBIC AND ANAEROBIC Blood Culture results may not be optimal due to an  inadequate volume of blood received in culture bottles   Culture   Final    NO GROWTH 5 DAYS Performed at [facility], [address], [place], [place] [address]    Report Status 07/03/2023 FINAL  Final    Time coordinating discharge: Over 30 minutes  Marien LITTIE Piety, MD  [facility] 08/03/2023, 10:50 AM

## 2023-08-21 ENCOUNTER — Encounter: Payer: Self-pay | Admitting: Internal Medicine

## 2023-08-27 ENCOUNTER — Other Ambulatory Visit: Payer: Self-pay

## 2023-08-27 ENCOUNTER — Telehealth: Payer: Self-pay | Admitting: Internal Medicine

## 2023-08-27 ENCOUNTER — Emergency Department

## 2023-08-27 ENCOUNTER — Observation Stay

## 2023-08-27 ENCOUNTER — Observation Stay
Admission: EM | Admit: 2023-08-27 | Discharge: 2023-08-28 | Disposition: A | Discharge status: Home / Self Care | Source: Ambulatory Visit | Attending: Internal Medicine | Admitting: Internal Medicine

## 2023-08-27 DIAGNOSIS — I10 Essential (primary) hypertension: Secondary | ICD-10-CM | POA: Insufficient documentation

## 2023-08-27 DIAGNOSIS — R2689 Other abnormalities of gait and mobility: Secondary | ICD-10-CM | POA: Diagnosis not present

## 2023-08-27 DIAGNOSIS — E119 Type 2 diabetes mellitus without complications: Secondary | ICD-10-CM | POA: Diagnosis not present

## 2023-08-27 DIAGNOSIS — E876 Hypokalemia: Secondary | ICD-10-CM | POA: Diagnosis not present

## 2023-08-27 DIAGNOSIS — F419 Anxiety disorder, unspecified: Secondary | ICD-10-CM | POA: Insufficient documentation

## 2023-08-27 DIAGNOSIS — D61818 Other pancytopenia: Secondary | ICD-10-CM | POA: Insufficient documentation

## 2023-08-27 DIAGNOSIS — I50811 Acute right heart failure: Secondary | ICD-10-CM

## 2023-08-27 DIAGNOSIS — R531 Weakness: Principal | ICD-10-CM | POA: Insufficient documentation

## 2023-08-27 DIAGNOSIS — I5033 Acute on chronic diastolic (congestive) heart failure: Secondary | ICD-10-CM | POA: Diagnosis not present

## 2023-08-27 DIAGNOSIS — Z853 Personal history of malignant neoplasm of breast: Secondary | ICD-10-CM | POA: Diagnosis not present

## 2023-08-27 DIAGNOSIS — E785 Hyperlipidemia, unspecified: Secondary | ICD-10-CM | POA: Diagnosis not present

## 2023-08-27 DIAGNOSIS — K7682 Hepatic encephalopathy: Secondary | ICD-10-CM

## 2023-08-27 DIAGNOSIS — I5A Non-ischemic myocardial injury (non-traumatic): Secondary | ICD-10-CM | POA: Insufficient documentation

## 2023-08-27 DIAGNOSIS — Z79899 Other long term (current) drug therapy: Secondary | ICD-10-CM | POA: Diagnosis not present

## 2023-08-27 DIAGNOSIS — E039 Hypothyroidism, unspecified: Secondary | ICD-10-CM | POA: Insufficient documentation

## 2023-08-27 DIAGNOSIS — D72819 Decreased white blood cell count, unspecified: Secondary | ICD-10-CM | POA: Diagnosis not present

## 2023-08-27 DIAGNOSIS — R0602 Shortness of breath: Secondary | ICD-10-CM | POA: Diagnosis present

## 2023-08-27 DIAGNOSIS — Z950 Presence of cardiac pacemaker: Secondary | ICD-10-CM | POA: Diagnosis not present

## 2023-08-27 DIAGNOSIS — I5032 Chronic diastolic (congestive) heart failure: Secondary | ICD-10-CM

## 2023-08-27 DIAGNOSIS — K7460 Unspecified cirrhosis of liver: Secondary | ICD-10-CM | POA: Insufficient documentation

## 2023-08-27 DIAGNOSIS — K7290 Hepatic failure, unspecified without coma: Secondary | ICD-10-CM

## 2023-08-27 DIAGNOSIS — R188 Other ascites: Secondary | ICD-10-CM

## 2023-08-27 DIAGNOSIS — G4733 Obstructive sleep apnea (adult) (pediatric): Secondary | ICD-10-CM | POA: Insufficient documentation

## 2023-08-27 LAB — CBC
HCT: 31.3 % — ABNORMAL LOW (ref 36.0–46.0)
Hemoglobin: 11 g/dL — ABNORMAL LOW (ref 12.0–15.0)
MCH: 32.3 pg (ref 26.0–34.0)
MCHC: 35.1 g/dL (ref 30.0–36.0)
MCV: 91.8 fL (ref 80.0–100.0)
Platelets: 106 K/uL — ABNORMAL LOW (ref 150–400)
RBC: 3.41 MIL/uL — ABNORMAL LOW (ref 3.87–5.11)
RDW: 15.6 % — ABNORMAL HIGH (ref 11.5–15.5)
WBC: 3.6 K/uL — ABNORMAL LOW (ref 4.0–10.5)
nRBC: 0 % (ref 0.0–0.2)

## 2023-08-27 LAB — BASIC METABOLIC PANEL WITH GFR
Anion gap: 13 (ref 5–15)
BUN: 15 mg/dL (ref 8–23)
CO2: 25 mmol/L (ref 22–32)
Calcium: 8.8 mg/dL — ABNORMAL LOW (ref 8.9–10.3)
Chloride: 101 mmol/L (ref 98–111)
Creatinine, Ser: 0.86 mg/dL (ref 0.44–1.00)
GFR, Estimated: >60 mL/min (ref >60)
Glucose, Bld: 115 mg/dL — ABNORMAL HIGH (ref 70–99)
Potassium: 2.4 mmol/L — CL (ref 3.5–5.1)
Sodium: 139 mmol/L (ref 135–145)

## 2023-08-27 LAB — AMMONIA: Ammonia: 24 umol/L (ref 9–35)

## 2023-08-27 LAB — BRAIN NATRIURETIC PEPTIDE: B Natriuretic Peptide: 228.7 pg/mL — ABNORMAL HIGH (ref 0.0–100.0)

## 2023-08-27 LAB — PHOSPHORUS: Phosphorus: 3.4 mg/dL (ref 2.5–4.6)

## 2023-08-27 LAB — TROPONIN I (HIGH SENSITIVITY)
Troponin I (High Sensitivity): 32 ng/L — ABNORMAL HIGH (ref <18)
Troponin I (High Sensitivity): 36 ng/L — ABNORMAL HIGH (ref <18)

## 2023-08-27 LAB — MAGNESIUM: Magnesium: 2.1 mg/dL (ref 1.7–2.4)

## 2023-08-27 MED ORDER — LEVOTHYROXINE SODIUM 50 MCG PO TABS
50.0000 ug | ORAL_TABLET | Freq: Every day | ORAL | Status: DC
  Administered 2023-08-28: 50 ug via ORAL
  Filled 2023-08-27: qty 1

## 2023-08-27 MED ORDER — FUROSEMIDE 10 MG/ML IJ SOLN
40.0000 mg | Freq: Two times a day (BID) | INTRAMUSCULAR | Status: DC
  Administered 2023-08-28 (×2): 40 mg via INTRAVENOUS
  Filled 2023-08-27 (×2): qty 4

## 2023-08-27 MED ORDER — SPIRONOLACTONE 25 MG PO TABS
50.0000 mg | ORAL_TABLET | Freq: Every day | ORAL | Status: DC
  Administered 2023-08-28: 50 mg via ORAL
  Filled 2023-08-27: qty 2

## 2023-08-27 MED ORDER — ALBUTEROL SULFATE (2.5 MG/3ML) 0.083% IN NEBU: 2.5000 mg | INHALATION_SOLUTION | RESPIRATORY_TRACT | Status: DC | PRN

## 2023-08-27 MED ORDER — HYDROXYZINE HCL 25 MG PO TABS
25.0000 mg | ORAL_TABLET | Freq: Three times a day (TID) | ORAL | Status: DC | PRN
  Administered 2023-08-28: 25 mg via ORAL
  Filled 2023-08-27: qty 1

## 2023-08-27 MED ORDER — RIFAXIMIN 550 MG PO TABS
550.0000 mg | ORAL_TABLET | Freq: Two times a day (BID) | ORAL | Status: DC
  Administered 2023-08-28 (×2): 550 mg via ORAL
  Filled 2023-08-27 (×2): qty 1

## 2023-08-27 MED ORDER — SACCHAROMYCES BOULARDII 250 MG PO CAPS
250.0000 mg | ORAL_CAPSULE | Freq: Two times a day (BID) | ORAL | Status: DC
  Administered 2023-08-28: 250 mg via ORAL
  Filled 2023-08-27: qty 1

## 2023-08-27 MED ORDER — DM-GUAIFENESIN ER 30-600 MG PO TB12: 1.0000 | ORAL_TABLET | Freq: Two times a day (BID) | ORAL | Status: DC | PRN

## 2023-08-27 MED ORDER — POTASSIUM CHLORIDE CRYS ER 20 MEQ PO TBCR
40.0000 meq | EXTENDED_RELEASE_TABLET | Freq: Once | ORAL | Status: AC
  Administered 2023-08-27: 40 meq via ORAL
  Filled 2023-08-27: qty 2

## 2023-08-27 MED ORDER — HYDRALAZINE HCL 20 MG/ML IJ SOLN: 5.0000 mg | INTRAMUSCULAR | Status: DC | PRN

## 2023-08-27 MED ORDER — INSULIN ASPART 100 UNIT/ML IJ SOLN: 0.0000 [IU] | Freq: Every day | INTRAMUSCULAR | Status: DC

## 2023-08-27 MED ORDER — INSULIN GLARGINE-YFGN 100 UNIT/ML ~~LOC~~ SOLN
20.0000 [IU] | Freq: Every day | SUBCUTANEOUS | Status: DC
  Filled 2023-08-27: qty 0.2

## 2023-08-27 MED ORDER — LACTULOSE 10 GM/15ML PO SOLN
30.0000 g | Freq: Four times a day (QID) | ORAL | Status: DC
  Administered 2023-08-28 (×2): 30 g via ORAL
  Filled 2023-08-27 (×2): qty 60

## 2023-08-27 MED ORDER — POTASSIUM CHLORIDE 10 MEQ/100ML IV SOLN
10.0000 meq | INTRAVENOUS | Status: AC
  Administered 2023-08-27 (×2): 10 meq via INTRAVENOUS
  Filled 2023-08-27 (×2): qty 100

## 2023-08-27 MED ORDER — SIMVASTATIN 10 MG PO TABS
20.0000 mg | ORAL_TABLET | Freq: Every day | ORAL | Status: DC
  Administered 2023-08-28: 20 mg via ORAL
  Filled 2023-08-27: qty 2

## 2023-08-27 MED ORDER — FENOFIBRATE 160 MG PO TABS
160.0000 mg | ORAL_TABLET | Freq: Every day | ORAL | Status: DC
  Administered 2023-08-28: 160 mg via ORAL
  Filled 2023-08-27: qty 1

## 2023-08-27 MED ORDER — ENOXAPARIN SODIUM 40 MG/0.4ML IJ SOSY
40.0000 mg | PREFILLED_SYRINGE | INTRAMUSCULAR | Status: DC
  Administered 2023-08-28: 40 mg via SUBCUTANEOUS
  Filled 2023-08-27: qty 0.4

## 2023-08-27 MED ORDER — PANTOPRAZOLE SODIUM 40 MG PO TBEC
40.0000 mg | DELAYED_RELEASE_TABLET | Freq: Every day | ORAL | Status: DC
  Administered 2023-08-28: 40 mg via ORAL
  Filled 2023-08-27: qty 1

## 2023-08-27 MED ORDER — INSULIN ASPART 100 UNIT/ML IJ SOLN
0.0000 [IU] | Freq: Three times a day (TID) | INTRAMUSCULAR | Status: DC
  Administered 2023-08-28: 1 [IU] via SUBCUTANEOUS
  Filled 2023-08-27: qty 1

## 2023-08-27 MED ORDER — POTASSIUM CHLORIDE CRYS ER 20 MEQ PO TBCR
40.0000 meq | EXTENDED_RELEASE_TABLET | Freq: Once | ORAL | Status: AC
  Administered 2023-08-28: 40 meq via ORAL
  Filled 2023-08-27: qty 2

## 2023-08-27 MED ORDER — GABAPENTIN 300 MG PO CAPS: 300.0000 mg | ORAL_CAPSULE | Freq: Two times a day (BID) | ORAL | Status: DC | PRN

## 2023-08-27 MED ORDER — ACETAMINOPHEN 325 MG PO TABS: 325.0000 mg | ORAL_TABLET | Freq: Four times a day (QID) | ORAL | Status: DC | PRN

## 2023-08-27 MED ORDER — TRAMADOL HCL 50 MG PO TABS: 50.0000 mg | ORAL_TABLET | Freq: Three times a day (TID) | ORAL | Status: DC | PRN

## 2023-08-27 MED ORDER — ONDANSETRON HCL 4 MG/2ML IJ SOLN: 4.0000 mg | Freq: Three times a day (TID) | INTRAMUSCULAR | Status: DC | PRN

## 2023-08-27 NOTE — ED Provider Notes (Signed)
 [facility] Provider Note    Event Date/Time   First MD Initiated Contact with Patient 08/27/23 2017     (approximate)   History   Shortness of Breath   HPI  Annette Foster is a 83 y.o. female who comes in with shortness of breath, bilateral leg swelling.  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   Patient to me at she reports the main reason she is here is from some increasing confusion that started last night.  She reports having recurrent issues with elevated ammonia leading to confusion.  They state that she has been taking her lactulose  with good bowel movements.  They were not sure if there was something else going on.  They deny any falls or hitting their head.  She also reports that upon discharge she was told to stop taking her torsemide  and recently she was told to just take 20 mg of it daily and she has noticed some increased shortness of breath increased leg swelling.  She denies any history of blood clots.   Physical Exam   Triage Vital Signs: ED Triage Vitals [08/27/23 1726]  Encounter Vitals Group     BP (!) 142/56     Girls Systolic BP Percentile      Girls Diastolic BP Percentile      Boys Systolic BP Percentile      Boys Diastolic BP Percentile      Pulse Rate 72     Resp 18     Temp (!) 97.5 F (36.4 C)     Temp Source Oral     SpO2 100 %     Weight      Height      Head Circumference      Peak Flow      Pain Score 0     Pain Loc      Pain Education      Exclude from Growth Chart     Most recent vital signs: Vitals:   08/27/23 1726  BP: (!) 142/56  Pulse: 72  Resp: 18  Temp: (!)  97.5 F (36.4 C)  SpO2: 100%     General: Awake, no distress.  CV:  Good peripheral perfusion.  Resp:  Normal effort.  Clear lungs Abd:  No distention.  Soft and nontender Other:  Swelling noted bilaterally 1+ edema Cranial nerves appear intact equal strength in arms and legs.  Patient is occasionally tearful about her confusion.  ED Results / Procedures / Treatments   Labs (all labs ordered are listed, but only abnormal results are displayed) Labs Reviewed  BASIC METABOLIC PANEL WITH GFR - Abnormal; Notable for the following components:      Result Value   Potassium 2.4 (*)    Glucose, Bld 115 (*)    Calcium 8.8 (*)    All other components within normal limits  CBC - Abnormal; Notable for the following components:   WBC 3.6 (*)    RBC 3.41 (*)    Hemoglobin 11.0 (*)    HCT 31.3 (*)    RDW 15.6 (*)    Platelets 106 (*)    All other components within normal limits  BRAIN NATRIURETIC PEPTIDE -  Abnormal; Notable for the following components:   B Natriuretic Peptide 228.7 (*)    All other components within normal limits  TROPONIN I (HIGH SENSITIVITY) - Abnormal; Notable for the following components:   Troponin I (High Sensitivity) 32 (*)    All other components within normal limits  TROPONIN I (HIGH SENSITIVITY)     EKG  My interpretation of EKG:  AV dual paced rhythm with a rate of 67 with some ST elevation in V2 T wave inversions in the inferior lateral leads.  Looks similar to prior EKGs  RADIOLOGY I have reviewed the xray personally and interpreted no obvious edema   PROCEDURES:  Critical Care performed: No  .1-3 Lead EKG Interpretation  Performed by: Ernest Ronal BRAVO, MD Authorized by: Ernest Ronal BRAVO, MD     Interpretation: normal     ECG rate:  70   Rhythm: paced     Ectopy: none     Conduction: normal      MEDICATIONS ORDERED IN ED: Medications  potassium chloride  10 mEq in 100 mL IVPB (10 mEq Intravenous New Bag/Given 08/27/23 2120)  potassium  chloride SA (KLOR-CON  M) CR tablet 40 mEq (40 mEq Oral Given 08/27/23 2117)     IMPRESSION / MDM / ASSESSMENT AND PLAN / ED COURSE  I reviewed the triage vital signs and the nursing notes.   Patient's presentation is most consistent with acute presentation with potential threat to life or bodily function.   Patient comes in with multiple symptoms but most notably is some increasing confusion will check ammonia get CT imaging to rule out intercranial hemorrhage get urine to evaluate for UTI.  She also reports some shortness of breath and increasing leg swelling most likely secondary to her decreased dose of torsemide .  Will get labs to evaluate for CHF, ACS.  BNP slightly elevated stable from prior.  Potassium low at 2.4 CBC shows stable low white count stable low hemoglobin.  Troponin is elevated.  Given patient's worsening leg swelling this could be related to her torsemide  but I will also get ultrasound to ensure no DVTs.  Discussed admission versus discharge and given her confusion and weakness they prefer admission to the hospital will discuss with hospitalist for admission  The patient is on the cardiac monitor to evaluate for evidence of arrhythmia and/or significant heart rate changes.      FINAL CLINICAL IMPRESSION(S) / ED DIAGNOSES   Final diagnoses:  Weakness  Hypokalemia  Acute right-sided congestive heart failure (HCC)     Rx / DC Orders   ED Discharge Orders     None        Note:  This document was prepared using Dragon voice recognition software and may include unintentional dictation errors.   Ernest Ronal BRAVO, MD 08/27/23 2251

## 2023-08-27 NOTE — H&P (Signed)
 History and Physical    Annette Foster FMW:[id number] DOB: 10/03/1940 DOA: 08/27/2023  Referring MD/NP/PA:   PCP: Alla Amis, MD   Patient coming from:  The patient is coming from home.     Chief Complaint: SOB and leg edema  HPI: Annette Foster is a 83 y.o. 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  Pt was recently hospitalized from 6/20 - 6/22 due to intractable nausea vomiting.  At discharge, her torsemide  was on hold.  Patient states that in the past several days, she developed shortness of breath, which has been progressively worsening.  She also has worsening bilateral leg edema.  No chest pain, cough, fever or chills.  Per her daughter at the bedside, patient has nausea and vomited once in the morning, which has resolved.  Currently no active nausea, vomiting, diarrhea or abdominal pain.  No symptoms of UTI.  Patient has been intermittently confused, but currently patient is alert oriented x 3 when I saw patient in ED.  She answered all questions appropriately.  She is drowsy during the interview.  She has generalized weakness. No fall or injury.  Per her daughter, patient was restarted torsemide  recently, but at lower dose of 20 mg daily.  Data reviewed independently and ED Course: pt was found to have ammonia 24, BNP 228, pancytopenia with a WBC of 3.6, hemoglobin 11.0, platelets of 106 (hemoglobin 9.3 on 08/02/2023), GFR> 60, potassium 2.4, magnesium  2.1, troponin 32 --> 36.  Temperature 97.5, blood pressure 142/56, heart rate 73, RR 14, oxygen saturation  100% on room air.  Chest x-ray negative. CT of head negative for acute intracranial abnormalities patient is placed in telemetry bed for observation.   EKG: I have personally reviewed.  Paced rhythm, QTc 570.  Review of Systems:   General: no fevers, chills, no body weight gain, has poor appetite, has fatigue HEENT: no blurry vision, hearing changes or sore throat Respiratory:  has dyspnea, no coughing, wheezing CV: no chest pain, no palpitations GI: had nausea, vomiting, no abdominal pain, diarrhea, constipation GU: no dysuria, burning on urination, increased urinary frequency, hematuria  Ext: has leg edema Neuro: no unilateral weakness, numbness, or tingling, no vision change or hearing loss. Has drowsiness Skin: no rash, no skin tear. MSK: No muscle spasm, no deformity, no limitation of range of movement in spin Heme: No easy bruising.  Travel history: No recent long distant travel.   Allergy:  Allergies  Allergen Reactions   Codeine Itching, Nausea And Vomiting and Other (See Comments)   Demeclocycline Rash   Demerol [Meperidine] Itching and Nausea And Vomiting   Hydrocodone  Itching and Nausea And Vomiting   Morphine  Nausea Only   Other Other (See Comments)   Oxycodone  Itching and Nausea And Vomiting   Pentazocine Itching, Nausea And Vomiting and Other (See Comments)   Tetracyclines & Related Rash   Coal Tar Extract Other (See Comments)   Hydrocodone -Acetaminophen  Other (See Comments)   Salicylic Acid Other (See Comments)   Tetracycline Hcl Other (See Comments)    Past Medical History:  Diagnosis Date   Abdominal pain    Allergy  Cancer [facility]) 2017   breast cancer- Left   Cataract    CHF (congestive heart failure) (HCC)    Collagenous colitis    Coronary artery disease    Diabetes mellitus without complication (HCC)    Diarrhea    Diverticulosis    Dysrhythmia    Fatty liver disease, nonalcoholic    Fibrocystic breast    GERD (gastroesophageal reflux  disease)    Heart murmur    Hyperlipidemia    Hypertension    Hypothyroidism    IBS (irritable bowel syndrome)    IDA (iron  deficiency anemia)    Liver cirrhosis (HCC)    Personal history of radiation therapy 2017   LEFT BREAST CA   Presence of permanent cardiac pacemaker    Sleep apnea    C-Pap    Past Surgical History:  Procedure Laterality Date   ABDOMINAL HYSTERECTOMY     [place] bso   ABDOMINAL SURGERY     APPENDECTOMY     AV NODE ABLATION N/A 05/17/2021   Procedure: AV NODE ABLATION;  Surgeon: Cindie Ole DASEN, MD;  [facility];  Service: Cardiovascular;  Laterality: N/A;   BREAST BIOPSY Bilateral 2016   negative   BREAST 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/2015   DCIS, papillary carcinoma in situ   BREAST BIOPSY Left 05/27/2016   BENIGN MAMMARY EPITHELIUM   BREAST BIOPSY Left 11/20/2017   affirm bx x clip BENIGN MAMMARY EPITHELIUM CONSISTENT WITH RAD THERAPY   BREAST EXCISIONAL BIOPSY Right    NEG 1980's   BREAST LUMPECTOMY Left 10/17/2015   DCIS and papillary carcinoma insitu, clear margins   CARDIAC SURGERY     has replacement valve   CATARACT EXTRACTION Right    CATARACT EXTRACTION W/PHACO Left 01/16/2021   Procedure: CATARACT EXTRACTION PHACO AND INTRAOCULAR LENS PLACEMENT (IOC) LEFT DIABETIC 8.46 00:59.8;  Surgeon: Jaye Fallow, MD;  Location: [facility];  Service: Ophthalmology;  Laterality: Left;   CHOLECYSTECTOMY     COLONOSCOPY WITH PROPOFOL  N/A 03/11/2016   Procedure: COLONOSCOPY WITH PROPOFOL ;  Surgeon: Lamar DASEN Holmes, MD;  Location: [facility] ENDOSCOPY;  Service: Endoscopy;  Laterality: N/A;   COLONOSCOPY WITH PROPOFOL  N/A 02/18/2020   Procedure: COLONOSCOPY WITH PROPOFOL ;  Surgeon: Jinny Carmine, MD;  Location: [facility] ENDOSCOPY;  Service: Endoscopy;  Laterality: N/A;   COLONOSCOPY WITH PROPOFOL  N/A 09/10/2022   Procedure: COLONOSCOPY WITH PROPOFOL ;  Surgeon: Therisa Bi, MD;  Location: [facility] ENDOSCOPY;  Service: Gastroenterology;  Laterality: N/A;    ESOPHAGEAL BANDING  09/10/2022   Procedure: ESOPHAGEAL BANDING;  Surgeon: Therisa Bi, MD;  Location: [facility] ENDOSCOPY;  Service: Gastroenterology;;   ESOPHAGEAL BANDING  10/17/2022   Procedure: ESOPHAGEAL BANDING;  Surgeon: Jinny Carmine, MD;  Location: [facility] ENDOSCOPY;  Service: Endoscopy;;   ESOPHAGEAL BANDING  12/24/2022   Procedure: ESOPHAGEAL BANDING;  Surgeon: Jinny Carmine, MD;  Location: [facility] ENDOSCOPY;  Service: Endoscopy;;   ESOPHAGEAL BANDING  05/13/2023   Procedure: ESOPHAGOSCOPY, WITH ESOPHAGEAL VARICES BAND LIGATION;  Surgeon: Jinny Carmine, MD;  Location: [facility] ENDOSCOPY;  Service: Endoscopy;;   ESOPHAGOGASTRODUODENOSCOPY N/A 05/13/2023   Procedure: EGD (ESOPHAGOGASTRODUODENOSCOPY);  Surgeon: Jinny Carmine, MD;  Location[facility]COPY;  Service: Endoscopy;  Laterality: N/A;   ESOPHAGOGASTRODUODENOSCOPY (EGD) WITH PROPOFOL  N/A 03/11/2016   Procedure: ESOPHAGOGASTRODUODENOSCOPY (EGD) WITH PROPOFOL ;  Surgeon: Lamar DASEN Holmes, MD;  Location: [facility] ENDOSCOPY;  Service: Endoscopy;  Laterality: N/A;   ESOPHAGOGASTRODUODENOSCOPY (EGD) WITH PROPOFOL  N/A 02/18/2020   Procedure: ESOPHAGOGASTRODUODENOSCOPY (EGD) WITH PROPOFOL ;  Surgeon: Jinny Carmine, MD;  Location: [facility] ENDOSCOPY;  Service: Endoscopy;  Laterality: N/A;   ESOPHAGOGASTRODUODENOSCOPY (EGD) WITH PROPOFOL  N/A 04/25/2020   Procedure: ESOPHAGOGASTRODUODENOSCOPY (EGD) WITH PROPOFOL ;  Surgeon: Jinny Carmine, MD;  Location: [facility] ENDOSCOPY;  Service: Endoscopy;  Laterality: N/A;   ESOPHAGOGASTRODUODENOSCOPY (EGD) WITH PROPOFOL  N/A 05/23/2020   Procedure: ESOPHAGOGASTRODUODENOSCOPY (EGD) WITH PROPOFOL ;  Surgeon: Jinny Carmine, MD;  Location: [facility] ENDOSCOPY;  Service: Endoscopy;  Laterality: N/A;   ESOPHAGOGASTRODUODENOSCOPY (EGD) WITH PROPOFOL  N/A 09/10/2022   Procedure: ESOPHAGOGASTRODUODENOSCOPY (EGD) WITH PROPOFOL ;  Surgeon: Therisa Bi, MD;  Location: [facility] ENDOSCOPY;  Service: Gastroenterology;  Laterality: N/A;   ESOPHAGOGASTRODUODENOSCOPY (EGD)  WITH PROPOFOL  N/A 10/17/2022   Procedure: ESOPHAGOGASTRODUODENOSCOPY (EGD) WITH PROPOFOL ;  Surgeon: Jinny Carmine, MD;  Location: [facility] ENDOSCOPY;  Service: Endoscopy;  Laterality: N/A;   ESOPHAGOGASTRODUODENOSCOPY (EGD) WITH PROPOFOL  N/A 12/24/2022   Procedure: ESOPHAGOGASTRODUODENOSCOPY (EGD) WITH PROPOFOL ;  Surgeon: Jinny Carmine, MD;  Location: [facility] ENDOSCOPY;  Service: Endoscopy;  Laterality: N/A;   ESOPHAGOGASTRODUODENOSCOPY (EGD) WITH PROPOFOL  N/A 01/29/2023   Procedure: ESOPHAGOGASTRODUODENOSCOPY (EGD) WITH PROPOFOL ;  Surgeon: Maryruth Ole DASEN, MD;  Location: [facility] ENDOSCOPY;  Service: Endoscopy;  Laterality: N/A;  Ideally we would intubate if possible given concern for varices   EUS N/A 10/04/2021   Procedure: LOWER ENDOSCOPIC ULTRASOUND (EUS);  Surgeon: Elta Fonda SQUIBB, MD;  Location: [facility] ENDOSCOPY;  Service: Gastroenterology;  Laterality: N/A;  [facility]   EYE SURGERY     FINGER ARTHROSCOPY WITH CARPOMETACARPEL ([facility]) ARTHROPLASTY Right 09/03/2018   Procedure: CARPOMETACARPEL [facility]) ARTHROPLASTY RIGHT THUMB;  Surgeon: Kathlynn Sharper, MD;  Location: [facility] ORS;  Service: Orthopedics;  Laterality: Right;   FLEXIBLE SIGMOIDOSCOPY N/A 09/14/2021   Procedure: FLEXIBLE SIGMOIDOSCOPY;  Surgeon: Jinny Carmine, MD;  Location: [facility] ENDOSCOPY;  Service: Endoscopy;  Laterality: N/A;  No anesthesia   GANGLION CYST EXCISION Right 09/03/2018   Procedure: REMOVAL GANGLION OF WRIST;  Surgeon: Kathlynn Sharper, MD;  Location: [facility] ORS;  Service: Orthopedics;  Laterality: Right;   HARDWARE REMOVAL Right 09/03/2018   Procedure: HARDWARE REMOVAL RIGHT THUMB;  Surgeon: Kathlynn Sharper, MD;  Location: [facility] ORS;  Service: Orthopedics;  Laterality: Right;  staple removed   HEMOSTASIS CLIP PLACEMENT  09/10/2022   Procedure: HEMOSTASIS CLIP PLACEMENT;  Surgeon: Therisa Bi, MD;  Location: [facility] ENDOSCOPY;  Service: Gastroenterology;;   HEMOSTASIS CONTROL  09/10/2022   Procedure: HEMOSTASIS CONTROL;  Surgeon: Therisa Bi, MD;   Location: [facility] ENDOSCOPY;  Service: Gastroenterology;;   HEMOSTASIS CONTROL  01/29/2023   Procedure: HEMOSTASIS CONTROL;  Surgeon: Maryruth Ole DASEN, MD;  Location: [facility] ENDOSCOPY;  Service: Endoscopy;;   IR PARACENTESIS  06/09/2023   IR RADIOLOGIST EVAL & MGMT  07/22/2023   IR TIPS  06/09/2023   JOINT REPLACEMENT Left    TKR   left sinusplasty      MASTECTOMY, PARTIAL Left 10/17/2015   Procedure: MASTECTOMY PARTIAL REVISION;  Surgeon: Larinda Unknown Sharps, MD;  Location: [facility] ORS;  Service: General;  Laterality: Left;   PACEMAKER IMPLANT N/A 03/23/2021   Procedure: PACEMAKER IMPLANT;  Surgeon: Cindie Ole DASEN, MD;  [facility];  Service: Cardiovascular;  Laterality: N/A;   PARTIAL MASTECTOMY WITH NEEDLE LOCALIZATION Left 09/29/2015   Procedure: PARTIAL MASTECTOMY WITH NEEDLE LOCALIZATION;  Surgeon: Larinda Unknown Sharps, MD;  Location: [facility] ORS;  Service: General;  Laterality: Left;   POLYPECTOMY  09/10/2022   Procedure: POLYPECTOMY;  Surgeon: Therisa Bi, MD;  Location: [facility] ENDOSCOPY;  Service: Gastroenterology;;   RECTAL EXAM UNDER ANESTHESIA N/A 10/05/2021   Procedure: RECTAL EXAM UNDER ANESTHESIA, ASPIRATION OF RECTAL CYST;  Surgeon: Rodolph,  Lucas, MD;  Location: [facility] ORS;  Service: General;  Laterality: N/A;   TOTAL ABDOMINAL HYSTERECTOMY W/ BILATERAL SALPINGOOPHORECTOMY      Social History:  reports that she has never smoked. She has never used smokeless tobacco. She reports that she does not drink alcohol and does not use drugs.  Family History:  Family History  Problem Relation Age of Onset   Breast cancer Paternal Grandmother    Colon cancer Father    Diabetes Sister    Diabetes Brother    Heart disease Brother    Prostate cancer Brother    Colon cancer Maternal Uncle    Prostate cancer Brother    Bladder Cancer Brother    Leukemia Mother        all   Ovarian cancer Neg Hx    Kidney cancer Neg Hx      Prior to Admission medications    Medication Sig Start Date End Date Taking? Authorizing Provider  azelastine  (OPTIVAR ) 0.05 % ophthalmic solution 1 drop into affected eye Ophthalmic Twice a day; Duration: 30 day 09/18/21   [provider]  Cholecalciferol  (VITAMIN D ) 50 MCG (2000 UT) CAPS Take 2,000 Units by mouth daily.    [provider]  clobetasol  ointment (TEMOVATE ) 0.05 % Apply 1 application topically as needed (vaginal irritation).    [provider]  clotrimazole  (LOTRIMIN ) 1 % cream Apply 1 Application topically 2 (two) times daily as needed.    [provider]  cyanocobalamin (,VITAMIN B-12,) 1000 MCG/ML injection 1,000 mcg every 30 (thirty) days. 04/23/19   [provider]  EPINEPHrine  0.3 mg/0.3 mL IJ SOAJ injection Inject 0.3 mg into the muscle. 05/18/19   [provider]  esomeprazole (NEXIUM) 40 MG capsule Take 40 mg by mouth 2 (two) times daily before a meal.  02/08/16   [provider]  fenofibrate  160 MG tablet Take 160 mg by mouth at bedtime.    [provider]  gabapentin  (NEURONTIN ) 300 MG capsule Take 300 mg by mouth 2 (two) times daily. 09/07/16 10/25/36  Corlis Honor BROCKS, MD  hydrOXYzine  (ATARAX ) 25 MG tablet 1 tablet as needed Orally every 8 hrs; Duration: 30 day(s)    [provider]  lactulose  (CHRONULAC ) 10 GM/15ML solution Take 45 mLs (30 g total) by mouth every 6 (six) hours. 08/03/23   Lenon Marien CROME, MD  levocetirizine (XYZAL) 5 MG tablet Take 5 mg by mouth every evening.    [provider]  levothyroxine  (SYNTHROID ) 50 MCG tablet Take 50 mcg by mouth. Take 1 tablet (50 mcg total) by mouth once daily 07/21/23   [provider]  magnesium  oxide (MAG-OX) 400 MG tablet Take 400 mg by mouth 2 (two) times daily.     [provider]  midodrine  (PROAMATINE ) 10 MG tablet Take 1 tablet (10 mg total) by mouth 3 (three) times daily with meals. 06/18/23   Josette Ade, MD  nadolol  (CORGARD ) 20 MG tablet Take 1  tablet (20 mg total) by mouth daily. 12/20/22   End, Lonni, MD  ondansetron  (ZOFRAN ) 4 MG tablet Take 1 tablet (4 mg total) by mouth every 8 (eight) hours as needed for nausea. 07/14/23   Brahmanday, Govinda R, MD  ondansetron  (ZOFRAN ) 8 MG tablet Take 1 tablet (8 mg total) by mouth every 8 (eight) hours as needed for nausea or vomiting. 07/14/23   Brahmanday, Govinda R, MD  ondansetron  (ZOFRAN -ODT) 4 MG disintegrating tablet Take 1 tablet (4 mg total) by mouth every 8 (eight)  hours as needed for nausea or vomiting. 08/03/23 09/02/23  Lenon Marien CROME, MD  potassium chloride  (KLOR-CON ) 20 MEQ packet 1 packet with food Orally Once a day; Duration: 30 day(s)    [provider]  rifaximin  (XIFAXAN ) 550 MG TABS tablet Take 550 mg by mouth. take 1 tablet (550 mg total) by mouth 2 (two) times daily for 120 days 07/23/23 11/20/23  [provider]  saccharomyces boulardii (FLORASTOR) 250 MG capsule Take 250 mg by mouth. Take 250 mg by mouth 2 (two) times daily    [provider]  simvastatin  (ZOCOR ) 20 MG tablet Take 20 mg by mouth at bedtime.    [provider]  spironolactone  (ALDACTONE ) 50 MG tablet Take 1 tablet (50 mg total) by mouth daily. 08/03/23   Lenon Marien CROME, MD  tirzepatide  (MOUNJARO ) 15 MG/0.5ML Pen Inject 15 mg into the skin once a week. On Fridays    [provider]  torsemide  (DEMADEX ) 20 MG tablet Take 3 tablets (60 mg total) by mouth daily. 06/26/23   End, Lonni, MD  traMADol  (ULTRAM ) 50 MG tablet Take 1 tablet (50 mg total) by mouth every 8 (eight) hours as needed. Patient taking differently: Take 50 mg by mouth every 8 (eight) hours as needed for moderate pain (pain score 4-6). 06/04/23   Brahmanday, Govinda R, MD  valACYclovir  (VALTREX ) 500 MG tablet Take 500 mg by mouth 2 (two) times daily as needed. 02/10/23   [provider]    Physical Exam: Vitals:   08/27/23 1726 08/27/23 2200 08/27/23 2215  BP: (!) 142/56  (!)  142/56  Pulse: 72 73 79  Resp: 18 14 19   Temp: (!) 97.5 F (36.4 C)    TempSrc: Oral    SpO2: 100% 100% 100%   General: Not in acute distress HEENT:       Eyes: PERRL, EOMI, no jaundice       ENT: No discharge from the ears and nose, no pharynx injection, no tonsillar enlargement.        Neck: positive JVD, no bruit, no mass felt. Heme: No neck lymph node enlargement. Cardiac: S1/S2, RRR, No murmurs, No gallops or rubs. Respiratory: No rales, wheezing, rhonchi or rubs. GI: Soft, nondistended, nontender, no rebound pain, no organomegaly, BS present. GU: No hematuria Ext: 2+ pitting leg edema bilaterally. 1+DP/PT pulse bilaterally. Musculoskeletal: No joint deformities, No joint redness or warmth, no limitation of ROM in spin. Skin: No rashes.  Neuro: Mildly drowsy, oriented X3, cranial nerves II-XII grossly intact, moves all extremities normally. Psych: Patient is not psychotic, no suicidal or hemocidal ideation.  Labs on Admission: I have personally reviewed following labs and imaging studies  CBC: Recent Labs  Lab 08/27/23 1728  WBC 3.6*  HGB 11.0*  HCT 31.3*  MCV 91.8  PLT 106*   Basic Metabolic Panel: Recent Labs  Lab 08/27/23 1728 08/27/23 2056  NA 139  --   K 2.4*  --   CL 101  --   CO2 25  --   GLUCOSE 115*  --   BUN 15  --   CREATININE 0.86  --   CALCIUM 8.8*  --   MG 2.1  --   PHOS  --  3.4   GFR: CrCl cannot be calculated (Unknown ideal weight.). Liver Function Tests: No results for input(s): AST, ALT, ALKPHOS, BILITOT, PROT, ALBUMIN  in the last 168 hours. No results for input(s): LIPASE, AMYLASE in the last 168 hours. Recent Labs  Lab 08/27/23 2056  AMMONIA 24   Coagulation Profile: No results for input(s): INR, PROTIME in the last 168 hours. Cardiac Enzymes: No results for input(s): CKTOTAL, CKMB, CKMBINDEX, TROPONINI in the last 168 hours. BNP (last 3 results) No results for input(s): PROBNP in the last 8760  hours. HbA1C: No results for input(s): HGBA1C in the last 72 hours. CBG: No results for input(s): GLUCAP in the last 168 hours. Lipid Profile: No results for input(s): CHOL, HDL, LDLCALC, TRIG, CHOLHDL, LDLDIRECT in the last 72 hours. Thyroid  Function Tests: No results for input(s): TSH, T4TOTAL, FREET4, T3FREE, THYROIDAB in the last 72 hours. Anemia Panel: No results for input(s): VITAMINB12, FOLATE, FERRITIN, TIBC, IRON , RETICCTPCT in the last 72 hours. Urine analysis:    Component Value Date/Time   COLORURINE YELLOW (A) 08/01/2023 1456   APPEARANCEUR CLEAR (A) 08/01/2023 1456   APPEARANCEUR Cloudy (A) 07/08/2017 1103   LABSPEC 1.004 (L) 08/01/2023 1456   PHURINE 7.0 08/01/2023 1456   GLUCOSEU NEGATIVE 08/01/2023 1456   HGBUR NEGATIVE 08/01/2023 1456   BILIRUBINUR NEGATIVE 08/01/2023 1456   BILIRUBINUR neg 10/10/2020 1112   BILIRUBINUR Negative 07/08/2017 1103   KETONESUR NEGATIVE 08/01/2023 1456   PROTEINUR NEGATIVE 08/01/2023 1456   UROBILINOGEN 0.2 10/10/2020 1112   NITRITE NEGATIVE 08/01/2023 1456   LEUKOCYTESUR NEGATIVE 08/01/2023 1456   Sepsis Labs: @LABRCNTIP (procalcitonin:4,lacticidven:4) )No results found for this or any previous visit (from the past 240 hours).   Radiological Exams on Admission:   Assessment/Plan Principal Problem:   Acute on chronic diastolic CHF (congestive heart failure) (HCC) Active Problems:   Decompensated nonalcoholic liver cirrhosis (HCC)   Myocardial injury   Pancytopenia (HCC)   Hypokalemia   Hypothyroidism, unspecified   Essential hypertension   HLD (hyperlipidemia)   Diabetes mellitus without complication (HCC)   OSA on CPAP   Anxiety   Assessment and Plan:  Acute on chronic diastolic CHF (congestive heart failure) [facility]): Patient has SOB, worsening bilateral leg edema, elevated BNP 228, positive JVD, clinically consistent with CHF exacerbation.  Patient has mild CHF exacerbation, low  oxygen desaturation.  2D echo on 10/07/2022 showed EF of 55 to 60% with grade 2 diastolic dysfunction.  -Place in tele bed for observation -Lasix  40 mg bid by IV -Continue home spironolactone  -Daily weights -strict I/O's -Low salt diet -Fluid restriction -As needed bronchodilators for shortness of breath - Follow-up lower extremity venous Doppler to rule out DVT which is ordered by Dr. Hobert of ED.  Myocardial injury: Troponin 32 --> 36, no chest pain, likely demand ischemia. - Continue Zocor  and fenofibrate   Decompensated nonalcoholic liver cirrhosis (HCC): S/p of TIPS, no abdominal distention today. Ammonia level is normal 24 -Check INR/PTT - Continue home lactulose  30 g 4 times per day -Continue home rifaximin    Pancytopenia [facility]): Hemoglobin stable, 11.1 (9.3 on 08/02/2023 -Follow-up with CBC   Hypokalemia: Potassium 2.4, magnesium  214, phosphorus 3.4 -Repleted potassium with IV potassium chloride    Hypothyroidism, unspecified -Synthroid    HTN (hypertension) -Hydralazine  as needed -pt is on IV Lasix    Hyperlipidemia, unspecified -Hold Zocor  and fenofibrate    Diabetes mellitus without complication (HCC): Recent A1c 5.6, well-controlled.  Patient is taking Tresiba  30 units daily. CBG is 100s -Sliding scale insulin  - hold tresiba   Anxiety: -As needed hydroxyzine   OSA  - CPAP     DVT ppx: SQ Lovenox   Code Status: Full code   Family Communication:   yes, patient's daughter at bed side.    Disposition Plan:  Anticipate discharge back to previous environment  Consults called:  none  Admission status and Level of care: Telemetry Cardiac:    for obs          Dispo: The patient is from: Home              Anticipated d/c is to: Home              Anticipated d/c date is: 1 day              Patient currently is not medically stable to d/c.    Severity of Illness:  The appropriate patient status for this patient is OBSERVATION. Observation status is judged  to be reasonable and necessary in order to provide the required intensity of service to ensure the patient's safety. The patient's presenting symptoms, physical exam findings, and initial radiographic and laboratory data in the context of their medical condition is felt to place them at decreased risk for further clinical deterioration. Furthermore, it is anticipated that the patient will be medically stable for discharge from the hospital within 2 midnights of admission.        Date of Service 08/27/2023    Caleb Exon [facility]   If 7PM-7AM, please contact night-coverage [email or web address] 08/27/2023, 11:35 PM

## 2023-08-27 NOTE — Addendum Note (Signed)
 Addended by: TOBIE HOUSTON A on: 08/27/2023 02:23 PM   Modules accepted: Orders

## 2023-08-27 NOTE — Telephone Encounter (Signed)
 Called and spoke with the patient and her daughter.  Patient reports swelling in both feet and ankles and weight gain of a couple of pounds over the last 7 days.  Patient denies any chest pain, but does endorse some shortness of breath and some dizziness that is resolved.  Patient reports taking Torsemide  20 mg once daily.  Medication list that that medication listed for 3 times daily and paused at this time.  Advised the patient this conversation will be forwarded to Dr. Mady and his nurse and to await further instructions.  Patient and daughter both advised to call 911 or go to the emergency room for worsening symptoms of shortness of breath and dizziness or onset of any chest pain.  Patient and daughter both verbalized understanding.

## 2023-08-27 NOTE — ED Triage Notes (Signed)
 Pt to ED via POV from [facility]. Pt reports SOB and bilateral leg swelling ongoing for a few days. Pt with hx of CHF. No CP. Daughter states pt appears more confused at times.

## 2023-08-27 NOTE — Telephone Encounter (Signed)
 Called and spoke with the patient and her daughter.  Relayed the information provided by Dr. Mady, for the patient to take Torsemide  20 mg 3 times daily and Spironolactone  50 mg once daily.  The patient and daughter verbalized understanding.  They did have questions about new onset of confusion experienced by the patient.  Advised the patient and daughter to follow up with her primary care provider for this as it may be due to numerous causes and the primary care provider would be best suited to provide proper referral and/or therapy for this specific symptom.  Patient stated that Dr. Maryruth (GI) had instructed them to resume torsemide  20 mg daily. Reiterated that Dr. Mady recommends torsemide  20 mg 3 times per day along with spironolactone  50 mg once daily.  Patient and daughter verbalized understanding.  Also informed them that I will have Scheduling call them to make an appointment for follow up with one of the APPs.  Patient and daughter verbalized understanding with all questions and concerns addressed at this time.

## 2023-08-27 NOTE — Telephone Encounter (Signed)
 Per recent hospital d/c summary, patient was supposed to be taking torsemide  60 mg daily and spironolactone  50 mg daily.  Please confirm that she is indeed taking these doses.  If not, I recommend that she begin taking them this way.  She should also follow-up with an APP later this week for reevaluation.  Lonni Hanson, MD [facility]

## 2023-08-27 NOTE — Telephone Encounter (Signed)
 Pt c/o swelling: STAT is pt has developed SOB within 24 hours  How much weight have you gained and in what time span? Couple of lbs in the last week or so  If swelling, where is the swelling located? Feet and ankles   Are you currently taking a fluid pill? Yes   Are you currently SOB? No   Do you have a log of your daily weights (if so, list)?    Have you gained 3 pounds in a day or 5 pounds in a week? no  Have you traveled recently?

## 2023-08-28 DIAGNOSIS — I5032 Chronic diastolic (congestive) heart failure: Secondary | ICD-10-CM | POA: Diagnosis not present

## 2023-08-28 DIAGNOSIS — E039 Hypothyroidism, unspecified: Secondary | ICD-10-CM

## 2023-08-28 DIAGNOSIS — K7581 Nonalcoholic steatohepatitis (NASH): Secondary | ICD-10-CM

## 2023-08-28 DIAGNOSIS — I5033 Acute on chronic diastolic (congestive) heart failure: Secondary | ICD-10-CM | POA: Diagnosis not present

## 2023-08-28 DIAGNOSIS — K7290 Hepatic failure, unspecified without coma: Secondary | ICD-10-CM | POA: Diagnosis not present

## 2023-08-28 DIAGNOSIS — R531 Weakness: Principal | ICD-10-CM

## 2023-08-28 DIAGNOSIS — E876 Hypokalemia: Secondary | ICD-10-CM | POA: Diagnosis not present

## 2023-08-28 LAB — BASIC METABOLIC PANEL WITH GFR
Anion gap: 8 (ref 5–15)
BUN: 14 mg/dL (ref 8–23)
CO2: 26 mmol/L (ref 22–32)
Calcium: 8.4 mg/dL — ABNORMAL LOW (ref 8.9–10.3)
Chloride: 108 mmol/L (ref 98–111)
Creatinine, Ser: 0.75 mg/dL (ref 0.44–1.00)
GFR, Estimated: >60 mL/min (ref >60)
Glucose, Bld: 142 mg/dL — ABNORMAL HIGH (ref 70–99)
Potassium: 2.8 mmol/L — ABNORMAL LOW (ref 3.5–5.1)
Sodium: 142 mmol/L (ref 135–145)

## 2023-08-28 LAB — CBC
HCT: 27 % — ABNORMAL LOW (ref 36.0–46.0)
Hemoglobin: 9.3 g/dL — ABNORMAL LOW (ref 12.0–15.0)
MCH: 31.6 pg (ref 26.0–34.0)
MCHC: 34.4 g/dL (ref 30.0–36.0)
MCV: 91.8 fL (ref 80.0–100.0)
Platelets: 82 K/uL — ABNORMAL LOW (ref 150–400)
RBC: 2.94 MIL/uL — ABNORMAL LOW (ref 3.87–5.11)
RDW: 15.3 % (ref 11.5–15.5)
WBC: 2.6 K/uL — ABNORMAL LOW (ref 4.0–10.5)
nRBC: 0 % (ref 0.0–0.2)

## 2023-08-28 LAB — CBG MONITORING, ED
Glucose-Capillary: 146 mg/dL — ABNORMAL HIGH (ref 70–99)
Glucose-Capillary: 107 mg/dL — ABNORMAL HIGH (ref 70–99)
Glucose-Capillary: 137 mg/dL — ABNORMAL HIGH (ref 70–99)
Glucose-Capillary: 113 mg/dL — ABNORMAL HIGH (ref 70–99)

## 2023-08-28 LAB — URINALYSIS, ROUTINE W REFLEX MICROSCOPIC
Bilirubin Urine: NEGATIVE
Glucose, UA: NEGATIVE mg/dL
Hgb urine dipstick: NEGATIVE
Ketones, ur: NEGATIVE mg/dL
Leukocytes,Ua: NEGATIVE
Nitrite: NEGATIVE
Protein, ur: NEGATIVE mg/dL
Specific Gravity, Urine: 1.009 (ref 1.005–1.030)
pH: 6 (ref 5.0–8.0)

## 2023-08-28 LAB — PROTIME-INR
INR: 1.5 — ABNORMAL HIGH (ref 0.8–1.2)
Prothrombin Time: 18.4 s — ABNORMAL HIGH (ref 11.4–15.2)

## 2023-08-28 LAB — POTASSIUM: Potassium: 3.4 mmol/L — ABNORMAL LOW (ref 3.5–5.1)

## 2023-08-28 LAB — APTT: aPTT: 41 s — ABNORMAL HIGH (ref 24–36)

## 2023-08-28 MED ORDER — POTASSIUM CHLORIDE CRYS ER 20 MEQ PO TBCR
40.0000 meq | EXTENDED_RELEASE_TABLET | Freq: Once | ORAL | Status: AC
  Administered 2023-08-28: 40 meq via ORAL
  Filled 2023-08-28: qty 2

## 2023-08-28 MED ORDER — POTASSIUM CHLORIDE CRYS ER 20 MEQ PO TBCR: 40.0000 meq | EXTENDED_RELEASE_TABLET | Freq: Two times a day (BID) | ORAL | Status: DC

## 2023-08-28 MED ORDER — POTASSIUM CHLORIDE CRYS ER 20 MEQ PO TBCR
40.0000 meq | EXTENDED_RELEASE_TABLET | ORAL | Status: AC
  Administered 2023-08-28 (×2): 40 meq via ORAL
  Filled 2023-08-28 (×2): qty 2

## 2023-08-28 NOTE — ED Notes (Signed)
 Amaryllis Dare, MD notified of pt potassium 2.8 and hemoglobin of 9.3

## 2023-08-28 NOTE — Care Management Obs Status (Signed)
 MEDICARE OBSERVATION STATUS NOTIFICATION   Patient Details  Name: Annette Foster MRN: [id number] Date of Birth: [birth year]   Medicare Observation Status Notification Given:  Yes    Dannon Perlow W, CMA 08/28/2023, 9:28 AM

## 2023-08-28 NOTE — Evaluation (Addendum)
 Physical Therapy Evaluation Patient Details Name: Annette Foster MRN: [id number] DOB: [birth year] Today's Date: 08/28/2023  History of Present Illness  Pt is a 83 y.o. 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 MD assessment includes acute on chronic CHF, myocardial injury, and anxiety.  Clinical Impression  Pt was pleasant and motivated to participate during the session and put forth good effort throughout. Pt required supervision during ambulation with RW, but remained steady throughout and no LOBs occurred. Pt ambulated 100 feet before feeling fatigued. Pt is not at baseline level of function in regards to endurance, as pt stated that she can usually ambulate further without experiencing fatigue. Pt reported no adverse symptoms during the session other than fatigue with SpO2 and HR WNL throughout on room air. Pt will benefit from continued PT services upon discharge to safely address deficits listed in patient problem list for decreased caregiver assistance and eventual return to PLOF.        If plan is discharge home, recommend the following: Assist for transportation;Assistance with cooking/housework;A little help with walking and/or transfers;A little help with bathing/dressing/bathroom   Can travel by private vehicle        Equipment Recommendations None recommended by PT  Recommendations for Other Services       Functional Status Assessment Patient has had a recent decline in their functional status and demonstrates the ability to make significant  improvements in function in a reasonable and predictable amount of time.     Precautions / Restrictions Precautions Precautions: None Restrictions Weight Bearing Restrictions Per Provider Order: No      Mobility  Bed Mobility Overal bed mobility: Independent             General bed mobility comments: Pt independent with all bed mobility tasks, not requiring physical assistance or use of bedrails    Transfers Overall transfer level: Needs assistance Equipment used: Rolling walker (2 wheels) Transfers: Sit to/from Stand Sit to Stand: Supervision           General transfer comment: Pt required supervision for STS from EOB with RW, but remained steady throughout and no LOBs occured    Ambulation/Gait Ambulation/Gait assistance: Supervision Gait Distance (Feet): 100 Feet Assistive device: Rolling walker (2 wheels) Gait Pattern/deviations: Step-through pattern, Decreased step length - right, Decreased step length - left Gait velocity: decreased     General Gait Details: Pt demonstrated decreased cadence and foot clearance bilaterally with ambulation with RW, but remained steady throughout and no LOBs occured  Careers information officer     Tilt Bed    Modified Rankin (Stroke Patients Only)       Balance Overall balance assessment: Needs assistance Sitting-balance support: No upper extremity supported, Feet supported Sitting balance-Leahy Scale: Normal     Standing balance support: Bilateral upper extremity supported, During functional activity, Reliant on assistive device for balance Standing balance-Leahy Scale: Fair Standing balance comment: Pt reliant on RW to maintain balance in stance and ambulation, but remained steady throughout and no LOBs occured  Pertinent Vitals/Pain Pain Assessment Pain Assessment: No/denies pain    Home Living Family/patient expects to be discharged to:: Private  residence Living Arrangements: Children Available Help at Discharge: Family;Available 24 hours/day Type of Home: House Home Access: Stairs to enter Entrance Stairs-Rails: Doctor, general practice of Steps: 2   Home Layout: One level Home Equipment: Agricultural consultant (2 wheels);BSC/3in1;Rollator (4 wheels)      Prior Function Prior Level of Function : Independent/Modified Independent             Mobility Comments: Pt reported being modI with household ambulation with use of rollator. Pt reported 2 falls in the last 6 months occuring due to tripping over objects. ADLs Comments: Pt reported requiring occasional assist from daughter with ADLs.     Extremity/Trunk Assessment   Upper Extremity Assessment Upper Extremity Assessment: Defer to OT evaluation    Lower Extremity Assessment Lower Extremity Assessment: Overall WFL for tasks assessed       Communication   Communication Communication: No apparent difficulties    Cognition Arousal: Alert Behavior During Therapy: WFL for tasks assessed/performed   PT - Cognitive impairments: No apparent impairments                         Following commands: Intact       Cueing Cueing Techniques: Verbal cues, Gestural cues     General Comments      Exercises     Assessment/Plan    PT Assessment Patient needs continued PT services  PT Problem List Decreased strength;Decreased balance;Decreased range of motion;Decreased mobility;Cardiopulmonary status limiting activity;Decreased activity tolerance       PT Treatment Interventions DME instruction;Therapeutic activities;Gait training;Therapeutic exercise;Functional mobility training    PT Goals (Current goals can be found in the Care Plan section)  Acute Rehab PT Goals Patient Stated Goal: to be able to go visit family members without feeling fatigued PT Goal Formulation: With patient Time For Goal Achievement: 09/10/23 Potential to Achieve Goals:  Good    Frequency Min 2X/week     Co-evaluation               AM-PAC PT 6 Clicks Mobility  Outcome Measure Help needed turning from your back to your side while in a flat bed without using bedrails?: None Help needed moving from lying on your back to sitting on the side of a flat bed without using bedrails?: None Help needed moving to and from a bed to a chair (including a wheelchair)?: A Little Help needed standing up from a chair using your arms (e.g., wheelchair or bedside chair)?: A Little Help needed to walk in hospital room?: A Little Help needed climbing 3-5 steps with a railing? : A Lot 6 Click Score: 19    End of Session Equipment Utilized During Treatment: Gait belt Activity Tolerance: Patient tolerated treatment well Patient left: in bed;with call bell/phone within reach Nurse Communication: Mobility status PT Visit Diagnosis: Other abnormalities of gait and mobility (R26.89);Repeated falls (R29.6)    Time: 8961-8887 PT Time Calculation (min) (ACUTE ONLY): 34 min   Charges:                 Leontine Ingles, SPT 08/28/23, 1:29 PM  This entire session was performed under direct supervision and direction of a licensed therapist/therapist assistant. I have personally read, edited and approve of the note as written.  Carmin Deed, DPT

## 2023-08-28 NOTE — ED Notes (Signed)
Pt ambulatory to bathroom at this time with steady gait 

## 2023-08-28 NOTE — ED Notes (Signed)
 Pt repositioned in the bed by this RN and Alexia, NT at this time. Lunch meal tray given

## 2023-08-28 NOTE — Discharge Summary (Signed)
 Physician Discharge Summary   Patient: Annette Foster MRN: [id number] DOB: 17-Oct-1940  Admit date:     08/27/2023  Discharge date: 08/28/23  Discharge Physician: Amaryllis Dare   PCP: Alla Amis, MD   Recommendations at discharge:  Please obtain CBC and BMP and follow-up Please adjust diuretics and potassium dose as needed Follow-up with primary care provider Follow-up with cardiology  Discharge Diagnoses: Principal Problem:   Acute on chronic diastolic CHF (congestive heart failure) (HCC) Active Problems:   Decompensated nonalcoholic liver cirrhosis (HCC)   Myocardial injury   Pancytopenia (HCC)   Hypokalemia   Hypothyroidism, unspecified   Essential hypertension   HLD (hyperlipidemia)   Diabetes mellitus without complication (HCC)   OSA on CPAP   Anxiety   Weakness   Hospital Course: Taken from H&P.   Annette Foster is a 83 y.o. 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   Pt was recently hospitalized from 6/20 - 6/22 due to intractable nausea vomiting.  At discharge, her torsemide  was on hold.  She was restarted on torsemide  recently but at a lower dose of 20 mg daily.  On presentation vital stable on room air, labs with ammonia 24, BNP 228, pancytopenia with a WBC of 3.6, hemoglobin 11.0, platelets of 106 (hemoglobin 9.3 on 08/02/2023), GFR> 60, potassium 2.4, magnesium  2.1, troponin 32 --> 36. EKG with paced rhythm, QTc 570. CXR negative for any acute abnormality.  CT head was also negative for any acute intracranial abnormality.  Patient  was admitted for for acute on chronic HFpEF and started on IV diuresis.  7/17: Vital stable, potassium at 2.8 which is being repleted, UA negative for UTI, CBC with hemoglobin of 9.3 which is exactly the same as 3 weeks ago, all cell lines decreased.  Potassium is being repleted. Lower extremity venous Doppler was negative for DVT  Patient clinically appears euvolemic, on room air and no peripheral edema.  Chest was clear.  She was having more anxiety.  No abdominal distention or significant ascites.  She will resume her prior home dose of torsemide  and need to have a close follow-up with her providers for further assistance.  She will continue on current medications and need to have a close follow-up with her providers for further assistance.  Palliative care was also consulted at her request and significant life limiting comorbidities.  They will follow-up as outpatient.  Consultants: Palliative care Procedures performed: None Disposition: Home health Diet recommendation:  Discharge Diet Orders (From admission, onward)     Start     Ordered   08/28/23 0000  Diet - low sodium heart healthy        08/28/23 1304           Cardiac diet DISCHARGE MEDICATION: Allergies as of 08/28/2023       Reactions   Codeine Itching, Nausea And Vomiting, Other (See Comments)   Demeclocycline Rash   Demerol [meperidine] Itching, Nausea And Vomiting   Hydrocodone  Itching, Nausea And Vomiting   Morphine  Nausea Only   Other Other (See Comments)   Oxycodone  Itching, Nausea And Vomiting   Pentazocine Itching, Nausea And Vomiting, Other (  See Comments)   Tetracyclines & Related Rash   Coal Tar Extract Other (See Comments)   Hydrocodone -acetaminophen  Other (See Comments)   Salicylic Acid Other (See Comments)   Tetracycline Hcl Other (See Comments)        Medication List     STOP taking these medications    azelastine  0.05 % ophthalmic solution Commonly known as: OPTIVAR    hydrOXYzine  25  MG tablet Commonly known as: ATARAX    midodrine  10 MG tablet Commonly known as: PROAMATINE    nadolol  20 MG tablet Commonly known as: CORGARD    ondansetron  4 MG tablet Commonly known as: ZOFRAN    ondansetron  8 MG tablet Commonly known as: ZOFRAN    saccharomyces boulardii 250 MG capsule Commonly known as: FLORASTOR       TAKE these medications    clobetasol  ointment 0.05 % Commonly known as: TEMOVATE  Apply 1 application topically as needed (vaginal irritation).   clotrimazole  1 % cream Commonly known as: LOTRIMIN  Apply 1 Application topically 2 (two) times daily as needed.   cyanocobalamin 1000 MCG/ML injection Commonly known as: VITAMIN B12 1,000 mcg every 30 (thirty) days.   EPINEPHrine  0.3 mg/0.3 mL Soaj injection Commonly known as: EPI-PEN Inject 0.3 mg into the muscle.   esomeprazole 40 MG capsule Commonly known as: NEXIUM Take 40 mg by mouth 2 (two) times daily before a meal.   fenofibrate  160 MG tablet Take 160 mg by mouth at bedtime.   gabapentin  300 MG capsule Commonly known as: NEURONTIN  Take 600 mg by mouth at bedtime.   Insulin  Aspart FlexPen 100 UNIT/ML Commonly known as: NOVOLOG  as directed. INJECT UP TO 168 UNITS DAILY AS DIRECTED   insulin  degludec 200 UNIT/ML FlexTouch Pen Commonly known as: TRESIBA  Inject 50 Units into the skin daily.   lactulose  10 GM/15ML solution Commonly known as: CHRONULAC  Take 45 mLs (30 g total) by mouth every 6 (six) hours.   levocetirizine 5 MG tablet Commonly known as: XYZAL Take 5 mg by mouth every evening.   levothyroxine  50 MCG tablet Commonly known as: SYNTHROID  Take 50 mcg by mouth. Take 1 tablet (50 mcg total) by mouth once daily   magnesium  oxide 400 MG tablet Commonly known as: MAG-OX Take 400 mg by mouth 2 (two) times daily.   Mounjaro  15 MG/0.5ML Pen Generic drug: tirzepatide  Inject 15 mg into the skin once a week. On Fridays   ondansetron  4 MG disintegrating tablet Commonly known as:  ZOFRAN -ODT Take 1 tablet (4 mg total) by mouth every 8 (eight) hours as needed for nausea or vomiting.   potassium chloride  20 MEQ packet Commonly known as: KLOR-CON  1 packet with food Orally Once a day; Duration: 30 day(s)   rifaximin  550 MG Tabs tablet Commonly known as: XIFAXAN  Take 550 mg by mouth. take 1 tablet (550 mg total) by mouth 2 (two) times daily for 120 days   simvastatin  20 MG tablet Commonly known as: ZOCOR  Take 20 mg by mouth at bedtime.   spironolactone  50 MG tablet Commonly known as: ALDACTONE  Take 1 tablet (50 mg total) by mouth daily.   torsemide  20 MG tablet Commonly known as: DEMADEX  Take 3 tablets (60 mg total) by mouth daily. What changed:  how much to take when to take this   traMADol  50 MG tablet Commonly known as: ULTRAM  Take 1 tablet (50 mg total) by mouth every 8 (eight) hours as needed. What changed: reasons to take this   valACYclovir  500 MG tablet Commonly known as: VALTREX  Take 500 mg by mouth  2 (two) times daily as needed.   Vitamin D  50 MCG (2000 UT) Caps Take 2,000 Units by mouth daily.        Follow-up Information     Alla Amis, MD. Schedule an appointment as soon as possible for a visit in 1 week(s).   Specialty: Family Medicine Contact information: [address] [facility] [place] [address] [phone]         Mady Bruckner, MD. Schedule an appointment as soon as possible for a visit in 1 week(s).   Specialty: Cardiology Contact information: [address] [place] [address] [phone]                Discharge Exam: Fredricka Weights   08/28/23 0106 08/28/23 0241  Weight: 60.5 kg 60.5 kg   General.  Frail elderly lady, in no acute distress. Pulmonary.  Lungs clear bilaterally, normal respiratory effort. CV.  Regular rate and rhythm, no JVD, rub or murmur. Abdomen.  Soft, nontender, nondistended, BS positive. CNS.  Alert and oriented .  No focal  neurologic deficit. Extremities.  No edema,  pulses intact and symmetrical. Psychiatry.  Patient appears worried and little anxious.  Condition at discharge: stable  The results of significant diagnostics from this hospitalization (including imaging, microbiology, ancillary and laboratory) are listed below for reference.   Imaging Studies: US  Venous Img Lower Unilateral Right Result Date: 08/28/2023 CLINICAL DATA:  83 year old female with shortness of breath. Lower extremity swelling. EXAM: RIGHT LOWER EXTREMITY VENOUS DOPPLER ULTRASOUND TECHNIQUE: Gray-scale sonography with compression, as well as color and duplex ultrasound, were performed to evaluate the deep venous system(s) from the level of the common femoral vein through the popliteal and proximal calf veins. COMPARISON:  None Available. FINDINGS: VENOUS Normal compressibility of the common femoral, superficial femoral, and popliteal veins, as well as the visualized calf veins. Visualized portions of profunda femoral vein and great saphenous vein unremarkable. No filling defects to suggest DVT on grayscale or color Doppler imaging. Doppler waveforms show normal direction of venous flow, normal respiratory plasticity and response to augmentation. Limited views of the contralateral common femoral vein are unremarkable. OTHER None. Limitations: none IMPRESSION: No evidence of right lower extremity deep venous thrombosis. Electronically Signed   By: VEAR Hurst M.D.   On: 08/28/2023 08:56   CT HEAD WO CONTRAST ( ) Result Date: 08/27/2023 CLINICAL DATA:  Headache leg swelling EXAM: CT HEAD WITHOUT CONTRAST TECHNIQUE: Contiguous axial images were obtained from the base of the skull through the vertex without intravenous contrast. RADIATION DOSE REDUCTION: This exam was performed according to the departmental dose-optimization program which includes automated exposure control, adjustment of the mA and/or kV according to patient size and/or use of  iterative reconstruction technique. COMPARISON:  CT brain 06/28/2023 FINDINGS: Brain: No acute territorial infarction, hemorrhage or intracranial mass. Atrophy and mild chronic small vessel ischemic changes of the white matter. Stable ventricle size Vascular: No hyperdense vessels.  Carotid vascular calcification Skull: Normal. Negative for fracture or focal lesion. Sinuses/Orbits: Moderate mucosal thickening in the sinuses. Other: None IMPRESSION: 1. No CT evidence for acute intracranial abnormality. 2. Atrophy and mild chronic small vessel ischemic changes of the white matter. 3. Moderate mucosal thickening in the sinuses. Electronically Signed   By: Luke Bun M.D.   On: 08/27/2023 21:18   DG Chest 2 View Result Date: 08/27/2023 CLINICAL DATA:  Shortness of breath EXAM: CHEST - 2 VIEW COMPARISON:  06/28/2023 FINDINGS: Right-sided pacing device as before. Stable cardiomediastinal  silhouette with aortic atherosclerosis. No focal airspace disease, pleural effusion or pneumothorax. Minimal atelectasis or scarring in the left mid to lower lung. IMPRESSION: No active cardiopulmonary disease. Minimal atelectasis or scarring in the left mid to lower lung. Electronically Signed   By: Luke Bun M.D.   On: 08/27/2023 17:46   CT ABDOMEN PELVIS W CONTRAST Result Date: 08/01/2023 CLINICAL DATA:  Acute abdominal pain EXAM: CT ABDOMEN AND PELVIS WITH CONTRAST TECHNIQUE: Multidetector CT imaging of the abdomen and pelvis was performed using the standard protocol following bolus administration of intravenous contrast. RADIATION DOSE REDUCTION: This exam was performed according to the departmental dose-optimization program which includes automated exposure control, adjustment of the mA and/or kV according to patient size and/or use of iterative reconstruction technique. CONTRAST:  OMNIPAQUE  IOHEXOL  300 MG/ML  SOLN COMPARISON:  CT abdomen and pelvis 06/15/2023 FINDINGS: Lower chest: There are multifocal patchy  airspace opacities in the bilateral lung bases, right greater than left which have decreased from prior. Hepatobiliary: Patient is status post cholecystectomy. There is no biliary ductal dilatation. Tip shunt present which is grossly patent. There is nodular liver contour. No focal liver lesions are identified. Pancreas: Unremarkable. No pancreatic ductal dilatation or surrounding inflammatory changes. Spleen: The spleen is mildly enlarged, unchanged. Adrenals/Urinary Tract: Adrenal glands are unremarkable. Kidneys are normal, without renal calculi, focal lesion, or hydronephrosis. Bladder is unremarkable. Stomach/Bowel: Stomach is within normal limits. No evidence of bowel wall thickening, distention, or inflammatory changes. There is a large amount of stool throughout the colon. The appendix is not visualized Vascular/Lymphatic: Aortic atherosclerosis. No enlarged abdominal or pelvic lymph nodes. Reproductive: Status post hysterectomy. No adnexal masses. Other: No abdominal wall hernia or abnormality. No abdominopelvic ascites. Musculoskeletal: No fracture is seen. IMPRESSION: 1. No acute localizing process in the abdomen or pelvis. 2. Large amount of stool throughout the colon. 3. Cirrhotic liver with TIP shunt present. 4. Mild splenomegaly. 5. Multifocal patchy airspace opacities in the bilateral lung bases, right greater than left, decreased from prior. 6. Aortic atherosclerosis. Aortic Atherosclerosis (ICD10-I70.0). Electronically Signed   By: Greig Pique M.D.   On: 08/01/2023 18:02    Microbiology: Results for orders placed or performed during the hospital encounter of 06/28/23  Blood Culture (routine x 2)     Status: None   Collection Time: 06/28/23  9:15 PM   Specimen: BLOOD  Result Value Ref Range Status   Specimen Description BLOOD RIGHT WRIST  Final   Special Requests   Final    BOTTLES DRAWN AEROBIC AND ANAEROBIC Blood Culture results may not be optimal due to an inadequate volume of blood  received in culture bottles   Culture   Final    NO GROWTH 5 DAYS Performed at [facility], [address] [place], [place] [address]    Report Status 07/03/2023 FINAL  Final  Blood Culture (routine x 2)     Status: None   Collection Time: 06/28/23  9:30 PM   Specimen: BLOOD  Result Value Ref Range Status   Specimen Description BLOOD RIGHT FOREARM  Final   Special Requests   Final    BOTTLES DRAWN AEROBIC AND ANAEROBIC Blood Culture results may not be optimal due to an inadequate volume of blood received in culture bottles   Culture   Final    NO GROWTH 5 DAYS Performed at [facility], [address], [place], [place] [address]    Report Status 07/03/2023 FINAL  Final    Labs: CBC:  Recent Labs  Lab 08/27/23 1728 08/28/23 0410  WBC 3.6* 2.6*  HGB 11.0* 9.3*  HCT 31.3* 27.0*  MCV 91.8 91.8  PLT 106* 82*   Basic Metabolic Panel: Recent Labs  Lab 08/27/23 1728 08/27/23 2056 08/28/23 0410 08/28/23 1435  NA 139  --  142  --   K 2.4*  --  2.8* 3.4*  CL 101  --  108  --   CO2 25  --  26  --   GLUCOSE 115*  --  142*  --   BUN 15  --  14  --   CREATININE 0.86  --  0.75  --   CALCIUM 8.8*  --  8.4*  --   MG 2.1  --   --   --   PHOS  --  3.4  --   --    Liver Function Tests: No results for input(s): AST, ALT, ALKPHOS, BILITOT, PROT, ALBUMIN  in the last 168 hours. CBG: Recent Labs  Lab 08/28/23 0105 08/28/23 0205 08/28/23 0724 08/28/23 1135  GLUCAP 146* 107* 137* 113*    Discharge time spent: greater than 30 minutes.  This record has been created using Conservation officer, historic buildings. Errors have been sought and corrected,but may not always be located. Such creation errors do not reflect on the standard of care.   Signed: Amaryllis Dare, MD [facility] 08/28/2023

## 2023-08-28 NOTE — Evaluation (Signed)
 Occupational Therapy Evaluation Patient Details Name: Annette Foster MRN: [id number] DOB: 1940/09/21 Today's Date: 08/28/2023   History of Present Illness   Pt is an 83 year old female presents with SOB and edema admitted with Acute on chronic diastolic CHF (congestive heart failure) (HCC), MI     PMH significant for nonalcoholic liver cirrhosis with ascites, s/p of TIPS on 4/28, DM, dCHF(G2 DD 09/2022, EF 55 to 60%), HTN, HLD, diverticulosis, left breast cancer (s/p radiation therapy), hypothyroidism, OSA on CPAP, pancytopenia, portal hypertensive gastropathy, grade 2 esophageal varices s/p banding 05/13/2023, 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 Pt is labile at times, reports she is feeling upset about planning her husbands funeral (who lives in a nursing home in [place] per her report). Pt reports she has been participating with [facility] therapy since admission. Pt presents with deficits in strength, endurance, activity tolerance, balance, affecting safe and optimal ADL completion. She will benefit from post acute OT to address function deficits and to facilitate optimal ADL performance. OT will follow acutely.     If plan is discharge home, recommend the following:   A little help with walking and/or transfers;A little help with bathing/dressing/bathroom;Help with stairs or ramp for entrance;Assist for transportation;Assistance with cooking/housework     Functional Status Assessment   Patient has had a recent decline in their functional status and demonstrates the ability to make significant improvements in function in a reasonable and predictable amount of time.     Equipment Recommendations   None recommended by OT (pt has recommended equipment)     Recommendations for Other Services          Precautions/Restrictions   Precautions Precautions: Fall Recall of Precautions/Restrictions: Impaired Restrictions Weight Bearing Restrictions Per Provider Order: No     Mobility Bed Mobility Overal bed mobility: Modified Independent                  Transfers Overall transfer level: Needs assistance Equipment used: Rolling walker (2 wheels) Transfers: Sit to/from Stand Sit to Stand: Supervision, Contact guard assist                  Balance Overall balance assessment: Needs assistance Sitting-balance support: No upper extremity supported, Feet supported Sitting balance-Leahy Scale: Good     Standing balance support: Bilateral upper extremity supported, During functional activity, Reliant on assistive device for balance Standing balance-Leahy Scale: Fair                             ADL either performed or assessed with clinical judgement   ADL Overall ADL's : Needs assistance/impaired Eating/Feeding: Set up;Sitting                   Lower Body Dressing: Maximal assistance;Bed level Lower Body Dressing Details (indicate cue type and reason): donn socks Toilet Transfer: Contact guard assist;Rolling walker (2 wheels);Ambulation Toilet Transfer Details (indicate cue type and reason): simulated         Functional mobility during ADLs: Contact guard assist;Rolling walker (2 wheels) (approx 50')       Vision Patient Visual Report: No change from baseline       Perception         Praxis         Pertinent Vitals/Pain  Pain Assessment Pain Assessment: No/denies pain     Extremity/Trunk Assessment Upper Extremity Assessment Upper Extremity Assessment: Generalized weakness   Lower Extremity Assessment Lower Extremity Assessment: Generalized weakness       Communication Communication Communication: No apparent difficulties   Cognition Arousal: Alert Behavior During Therapy: WFL for tasks assessed/performed,  Lability Cognition: Cognition impaired           Executive functioning impairment (select all impairments): Reasoning                   Following commands: Intact       Cueing  General Comments   Cueing Techniques: Verbal cues;Gestural cues  vss throughout   Exercises Other Exercises Other Exercises: edu re: role of OT, role of rehab, discharge recommendations   Shoulder Instructions      Home Living Family/patient expects to be discharged to:: Private residence Living Arrangements: Children Available Help at Discharge: Family;Available 24 hours/day Type of Home: House Home Access: Stairs to enter [employer] of Steps: 2 Entrance Stairs-Rails: Right;Left Home Layout: One level     Bathroom Shower/Tub: Walk-in shower         Home Equipment: Agricultural consultant (2 wheels);BSC/3in1;Rollator (4 wheels)          Prior Functioning/Environment Prior Level of Function : History of Falls (last six months)             Mobility Comments: amb with rollator ADLs Comments: generally MOD I for ADL, PRN assist from daugther and assist for IADLs    OT Problem List: Decreased activity tolerance   OT Treatment/Interventions: Self-care/ADL training;Therapeutic exercise;Patient/family education;Balance training;Therapeutic activities;DME and/or AE instruction;Energy conservation      OT Goals(Current goals can be found in the care plan section)   Acute Rehab OT Goals Patient Stated Goal: improve function OT Goal Formulation: With patient Time For Goal Achievement: 09/11/23 Potential to Achieve Goals: Good ADL Goals Pt Will Perform Grooming: with modified independence;sitting;standing Pt Will Perform Lower Body Dressing: with modified independence;sitting/lateral leans;sit to/from stand Pt Will Transfer to Toilet: with modified independence;ambulating Pt Will Perform Toileting - Clothing Manipulation and hygiene: with modified  independence;sitting/lateral leans;sit to/from stand   OT Frequency:  Min 2X/week    Co-evaluation              AM-PAC OT 6 Clicks Daily Activity     Outcome Measure Help from another person eating meals?: None Help from another person taking care of personal grooming?: None Help from another person toileting, which includes using toliet, bedpan, or urinal?: None Help from another person bathing (including washing, rinsing, drying)?: A Little Help from another person to put on and taking off regular upper body clothing?: None Help from another person to put on and taking off regular lower body clothing?: A Little 6 Click Score: 22   End of Session Equipment Utilized During Treatment: Rolling walker (2 wheels)  Activity Tolerance: Patient tolerated treatment well Patient left: in bed;with call bell/phone within reach;with bed alarm set  OT Visit Diagnosis: Other abnormalities of gait and mobility (R26.89)                Time: 8850-8794 OT Time Calculation (min): 16 min Charges:  OT General Charges $OT Visit: 1 Visit OT Evaluation $OT Eval Low Complexity: 1 Low  Therisa Sheffield, OTD OTR/L  08/28/23, 1:17 PM

## 2023-08-28 NOTE — TOC Initial Note (Signed)
 Transition of Care [facility]) - Initial/Assessment Note    Patient Details  Name: Annette Foster MRN: [id number] Date of Birth: October 23, 1940  [facility] CM/SW Contact:    Edsel DELENA Fischer, LCSW Phone Number: 08/28/2023, 6:45 PM  Clinical Narrative:                  SW met with pt at bedside. Per pt report:  Pt stated that she came to ED due to swelling in her feet.  Pt stated that she lives with her daughter and daughter husband.  Pt stated that pt husband was a veteran and has dementia and agent orange.  Currently pt husband is in SNF.  Pt began to cry. Pt also stated that she did have a fall in the bathroom once and that daughters husband had to make repairs.  During visit with pt, pt at times would lose her train of thought.  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  Pt currently resides with daughter at [address] [place], [place], [place].  PCP: Dr. Alla [facility]  DME: [facility], bedside commode wears glasses.  Pt stated that daughter will transport home when discharged and usually assist with transportation needs. No financial concerns or issues affording medications.  Pt stated that daughter helps with baths and that she wears pads for bladder leaks.  Pt stated that she takes medications as prescribed. CPAP/ Oxygen: Pt stated she has CPAP machine at her house but not her daughters.  Pt stated that she doesn't feel the need to use it now because she sleeps fine.  Dialysis: no.  SW spoke with pt about A[facility] Attend[facility] with [facility] since husband is a veteran if pt may need service in the future.  Pt stated that she received letter from her insurance that [facility] will not be a covered provider.  Pt received call from [facility] during visit and was told by [facility] that pt can continue services with [facility].  Leelee asked sw about pt status.  SW told Leelee that sw will call her back. Number provided [phone]  SW called Leelee with [facility] at [phone]. No answer. CM left message  that pt was under observation.    Barriers to Discharge: Continued Medical Work up (pt has recently discharged since sw last seen pt)   Patient Goals and CMS Choice   CMS Medicare.gov Compare Post Acute Care list provided to:: Patient (Pt would like to continue with [facility]) Choice offered to / list presented to : Patient (Pt would like to continue with [facility])      Expected Discharge Plan and Services In-house Referral: Clinical Social Work     Living arrangements for the past 2 months: Single Family Home Expected Discharge Date: 08/28/23                 DME Agency: [facility] (Pt is currenlty receiving services with agency)                  Prior Living Arrangements/Services Living arrangements for the past 2 months: Single Family Home Lives with:: Adult Children Patient language and need for interpreter reviewed:: Yes Do you feel safe going back to the place where you live?: Yes      Need for Family Participation in Patient Care: Yes (Comment) Care giver support system in place?: Yes (comment) Current home services: DME (DME: [facility]) Criminal Activity/Legal Involvement Pertinent to Current Situation/Hospitalization: No - Comment as needed  Activities of  Daily Living      Permission Sought/Granted                  Emotional Assessment Appearance:: Appears stated age Attitude/Demeanor/Rapport: Engaged Affect (typically observed): Appropriate Orientation: : Oriented to Self, Oriented to Place, Oriented to Situation   Psych Involvement: No (comment)  Admission diagnosis:  Acute on chronic diastolic CHF (congestive heart failure) (HCC) [I50.33] Patient Active Problem List   Diagnosis Date Noted   Weakness 08/28/2023   Acute on chronic diastolic CHF (congestive heart failure) (HCC) 08/27/2023   HLD (hyperlipidemia) 08/27/2023   Myocardial injury 08/27/2023   Constipation 08/03/2023   Intractable nausea and vomiting 08/02/2023    Intractable vomiting with nausea 08/01/2023   Hyperammonemia (HCC) 08/01/2023   Aspiration pneumonia (HCC) 06/28/2023   Chronic diastolic CHF (congestive heart failure) (HCC) 06/28/2023   Acute hepatic encephalopathy (HCC) 06/28/2023   Liver cirrhosis secondary to NASH (HCC) 06/05/2023   Hypotension 06/05/2023   Refractory ascites 05/30/2023   Hyperlipidemia, unspecified 05/30/2023   Diabetes mellitus without complication (HCC) 05/30/2023   Overweight (BMI 25.0-29.9) 05/30/2023   Anxiety 05/30/2023   Decompensated nonalcoholic liver cirrhosis (HCC) 05/21/2023   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 05/21/2023   OSA on CPAP 05/21/2023   IDA (iron  deficiency anemia) 05/21/2023   Abdominal pain 03/03/2023   Electrolyte abnormality 03/03/2023   ABLA (acute blood loss anemia) 03/02/2023   Non-alcoholic fatty liver disease 01/30/2023   Sepsis due to pneumonia (HCC) 01/29/2023   GERD without esophagitis 01/29/2023   Dyslipidemia 01/29/2023   Type 2 diabetes mellitus with complication, with long-term current use of insulin  (HCC) 01/29/2023   Hypokalemia 01/29/2023   Hyponatremia 01/29/2023   GI bleeding 01/28/2023   H/O atrioventricular nodal ablation 11/13/2022   Esophageal varices without bleeding (HCC) 10/17/2022   Pneumonia 09/11/2022   Adenomatous polyp of colon 09/10/2022   Rectal bleeding 09/08/2022   Leg pain 07/18/2022   Varicose veins with inflammation 07/17/2022   Unspecified injury of head, initial encounter 09/14/2021   Rectal lesion    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) 08/22/2021   Persistent atrial fibrillation (HCC) 05/17/2021   S/P placement of cardiac pacemaker 03/23/2021   Portal hypertension (HCC)    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 bleeding (HCC)    Chronic heart failure with preserved ejection fraction (HFpEF) (HCC) 04/05/2020   Hepatic cirrhosis (HCC) 04/05/2020   Abnormal EKG 04/05/2020   Allergic rhinitis due to  pollen 02/24/2020   Chronic allergic conjunctivitis 02/24/2020   Allergic rhinitis due to animal (cat) (dog) hair and dander 02/24/2020   Allergic rhinitis 02/24/2020   Polyp of transverse colon    Hematochezia 02/17/2020   Chronic anticoagulation 02/17/2020   Anasarca 02/17/2020   Symptomatic sinus bradycardia 02/17/2020   Paroxysmal atrial fibrillation (HCC) 02/17/2020   AKI (acute kidney injury) (HCC) 02/17/2020   History of colitis 02/17/2020   Acute GI bleeding    Atrial fibrillation with RVR (HCC) 02/07/2020   NASH (nonalcoholic steatohepatitis)    Hav (hallux abducto valgus), unspecified laterality 06/03/2019   Acquired bilateral hammer toes 06/03/2019   Iron  deficiency 04/26/2019   Right-sided chest wall pain 04/12/2019   Mid back pain on right side 04/12/2019   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 11/03/2017   Recurrent UTI (urinary tract infection) 07/23/2017   Nausea vomiting and diarrhea 01/11/2017   Hypothyroidism, unspecified 01/11/2017   Essential hypertension 01/11/2017   GERD (gastroesophageal reflux disease) 01/11/2017   Pancytopenia (HCC) 11/14/2016   Monilial  vulvitis 11/06/2016   Spongiotic psoriasiform dermatitis 10/08/2016   Status post hysterectomy 06/20/2016   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 [place] [address] Phone: [phone] Fax: [phone]     Social Drivers of Health (SDOH) Social History: SDOH Screenings   Food Insecurity: No Food Insecurity (08/07/2023)   Received from [facility]  Housing: Low Risk  (08/07/2023)   Received from [facility]  Transportation Needs: No Transportation Needs (08/07/2023)   Received from [facility]  Utilities: Not At Risk  (08/07/2023)   Received from [facility]  Financial Resource Strain: Medium Risk (07/17/2023)   Received from [facility]  Physical Activity: Unknown (01/30/2018)   Received from [facility]  Social Connections: Moderately Isolated (08/01/2023)  Tobacco Use: Low Risk  (08/27/2023)   SDOH Interventions:     Readmission Risk Interventions     No data to display

## 2023-08-28 NOTE — Progress Notes (Signed)
 Heart Failure Nurse Navigator Progress Note  PCP: Alla Amis, MD PCP-Cardiologist: Lonni Hanson, MD Admission Diagnosis: Weakness Hypokalemia Acute right-sided congestive heart failure [facility])  Admitted from: Home in POV  Presentation:   Annette Foster presented with shortness of breath and bilateral leg swelling for a few days. More recent increased shortness of breath and leg swelling with heart medication changes.  Per daughter patient appears more confused at times. Patient reports having recurrent issues with elevated ammonia leading to confusion.  Reports compliance with lactulose  with good bowel movements.  Patient has Hx: of CHF. Patient has nonalcoholic liver cirrhosis with ascites, S/P of TIPS on 4/28. BNP 2287. HS-Troponin 32. Chest x-ray: no active cardiopulmonary disease. Patient was very tearful about one of her daughters recently passing.    ECHO/ LVEF: 55-60%-03/16/21  Clinical Course:  Past Medical History:  Diagnosis Date   Abdominal pain    Allergy    Cancer (HCC) 2017   breast cancer- Left   Cataract    CHF (congestive heart failure) (HCC)    Collagenous colitis    Coronary artery disease    Diabetes mellitus without complication (HCC)    Diarrhea    Diverticulosis    Dysrhythmia    Fatty liver disease, nonalcoholic    Fibrocystic breast    GERD (gastroesophageal reflux disease)    Heart murmur    Hyperlipidemia    Hypertension    Hypothyroidism    IBS (irritable bowel syndrome)    IDA (iron  deficiency anemia)    Liver cirrhosis (HCC)    Personal history of radiation therapy 2017   LEFT BREAST CA   Presence of permanent cardiac pacemaker    Sleep apnea    C-Pap     Social History   Socioeconomic History   Marital status: Married    Spouse name: Alexa   Number of children: Not on file   Years of education: Not on file   Highest education level: Not on file  Occupational History   Not on file  Tobacco Use   Smoking status:  Never   Smokeless tobacco: Never  Vaping Use   Vaping status: Never Used  Substance and Sexual Activity   Alcohol use: Never   Drug use: Never   Sexual activity: Not Currently    Birth control/protection: Surgical  Other Topics Concern   Not on file  Social History Narrative    Husband lives at nursing home , lives with husbands sister   Social Drivers of Health   Financial Resource Strain: Medium Risk (07/17/2023)   Received [facility]   Overall Financial Resource Strain (CARDIA)    Difficulty of Paying Living Expenses: Somewhat hard  Food Insecurity: No Food Insecurity (08/07/2023)   Received from [facility]   Hunger Vital Sign    Within the past 12 months, you worried that your food would run out before you got the money to buy more.: Never true    Within the past 12 months, the food you bought just didn't last and you didn't have money to get more.: Never true  Transportation Needs: No Transportation Needs (08/07/2023)   Received [facility] Transportation    In the past 12 months, has lack of transportation kept you from medical appointments or from getting medications?: No    Lack of Transportation (Non-Medical): No  Physical Activity: Unknown (01/30/2018)   Received from [facility]   Exercise Vital  Sign    Days of Exercise per Week: 7 days    Minutes of Exercise per Session: Not on file  Stress: Not on file  Social Connections: Moderately Isolated (08/01/2023)   Social Connection and Isolation Panel    Frequency of Communication with Friends and Family: Once a week    Frequency of Social Gatherings with Friends and Family: Once a week    Attends Religious Services: More than 4 times per year    Active Member of [employer] or Organizations: No    Attends Engineer, structural: Never    Marital Status: Married   Water engineer and Provision:  Detailed education and instructions  provided on heart failure disease management including the following:  Signs and symptoms of Heart Failure When to call the physician Importance of daily weights Low sodium diet Fluid restriction Medication management Anticipated future follow-up appointments  Patient education given on each of the above topics.  Patient acknowledges understanding via teach back method and acceptance of all instructions.  Education Materials:  Living Better With Heart Failure Booklet, HF zone tool, & Daily Weight Tracker Tool.  Patient has scale at home: Yes Patient has pill box at home: No.  Patient being discharged right now if she is unable to get a pill box please provide her with one at her follow-up appointment on Monday.  High Risk Criteria for Readmission and/or Poor Patient Outcomes: Heart failure hospital admissions (last 6 months): 1  No Show rate: 1% Difficult social situation: None Demonstrates medication adherence: Yes Primary Language: English Literacy level: Reading, Writing & Comprehension  Barriers of Care:   None  Considerations/Referrals:   Referral made to Heart Failure Pharmacist Stewardship: Yes Referral made to Heart Failure CSW/NCM TOC: No Referral made to Heart & Vascular TOC clinic: Yes. 09/01/23 @ 3:30 PM  Items for Follow-up on DC/TOC: Diet & Fluid Restrictions Daily Weights Continued Heart Failure Education  Offer patient resources for grief counseling at follow-up appointment due to daughter passing.  Charmaine Pines, RN, BSN [facility] Heart Failure Navigator Secure Chat Only

## 2023-08-28 NOTE — ED Notes (Signed)
 This tech assisted pt in cutting up her food. Pt is able to feed herself independently.

## 2023-08-28 NOTE — Consult Note (Signed)
 Consultation Note Date: 08/28/2023 at 1445  Patient Name: Annette Foster  DOB: April 18, 1940  MRN: [id number]  Age / Sex: 83 y.o., female  PCP: Alla Amis, MD Referring Physician: Caleen Qualia, MD  HPI/Patient Profile: 83 y.o. 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   Clinical Assessment and Goals of Care: Extensive chart review completed prior to meeting patient including labs, vital signs, imaging, progress notes, orders, and available advanced directive documents from current and previous encounters. I then met with patient in the ED to discuss diagnosis prognosis, GOC, EOL wishes, disposition and options.  I introduced Palliative Medicine as specialized medical care for people living with serious illness. It focuses on providing relief from the symptoms and stress of a serious illness. The goal is to improve quality of life for both the patient and the family.  Patient recalls meeting with my colleague Devere at the previous hospitalization.  She shared she is familiar with the difference between palliative and hospice services.  As far as functional and nutritional status she endorses independence with ADLs.  She endorses no difficulty with p.o. intake.  We discussed patient's current illness and what it means in the larger context of patient's on-going co-morbidities.  She becomes tearful when discussing that she never had 1 drink of alcohol in her entire life but yet somehow she has  a sick liver.  Therapeutic silence, active listening, emotional support provided.  Education on care of nonalcoholic liver cirrhosis with ascites discussed.  I attempted to elicit values and goals of care important to the patient.  She shared she has been in the spittle at least 5 times in the last little bit.  Discussed rehospitalization as well as previous hospitalizations.  Patient shares that she wants to continue to work with the palliative care team.  She shared she is not quite ready for hospice services.  She shares her daughter is scared to talk to palliative but that she really appreciates this additional layer of support.  I offered to speak with patient's daughter and patient politely declined.  Discussed outpatient palliative services.  Patient was accepting of this referral and looks forward to intake meeting.  She shared she hopes her daughter can be at this appointment as well.  Discussed with patient/family the importance of continued conversation with family and the medical providers regarding overall plan of care and treatment options, ensuring decisions are within the context of the patient's values and GOCs.    Questions and concerns were addressed.  Patient said to discharge home today.  No further palliative needs at this time.  Primary Decision Maker PATIENT  Physical Exam Vitals reviewed.  Constitutional:  General: She is not in acute distress.    Appearance: She is normal weight.  HENT:     Head: Normocephalic.     Mouth/Throat:     Mouth: Mucous membranes are moist.  Eyes:     Pupils: Pupils are equal, round, and reactive to light.  Cardiovascular:     Rate and Rhythm: Normal rate.  Pulmonary:     Effort: Pulmonary effort is normal.  Skin:    General: Skin is warm and dry.  Neurological:     Mental Status: She is alert and oriented to person, place, and time.  Psychiatric:        Mood and Affect: Mood normal. Mood is not anxious.        Behavior:  Behavior normal. Behavior is not agitated.     Palliative Assessment/Data: 70-80%     Thank you for this consult. Palliative medicine will continue to follow and assist holistically.   Time Total: 75 minutes  Time spent includes: Detailed review of medical records (labs, imaging, vital signs), medically appropriate exam (mental status, respiratory, cardiac, skin), discussed with treatment team, counseling and educating patient, family and staff, documenting clinical information, medication management and coordination of care.  Signed by: Lamarr Gunner, DNP, FNP-BC Palliative Medicine   Please contact Palliative Medicine Team providers via [facility] for questions and concerns.

## 2023-08-28 NOTE — Hospital Course (Addendum)
 Taken from H&P.   Annette Foster is a 83 y.o. 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   Pt was recently hospitalized from 6/20 - 6/22 due to intractable nausea vomiting.  At discharge, her torsemide  was on hold.  She was restarted on torsemide  recently but at a lower dose of 20 mg daily.  On presentation vital stable on room air, labs with ammonia 24, BNP 228, pancytopenia with a WBC of 3.6, hemoglobin 11.0, platelets of 106 (hemoglobin 9.3 on 08/02/2023), GFR> 60, potassium 2.4, magnesium  2.1, troponin 32 --> 36. EKG with paced rhythm, QTc 570. CXR negative for any acute abnormality.  CT head was also negative for any acute intracranial abnormality.  Patient was admitted for for acute on chronic HFpEF and started on IV diuresis.  7/17: Vital stable, potassium at 2.8 which is being repleted, UA negative for UTI, CBC with hemoglobin of 9.3 which is exactly the same as 3 weeks ago, all cell lines decreased.  Potassium is being repleted. Lower extremity venous Doppler was negative for DVT  Patient clinically appears euvolemic, on room air and no peripheral edema.  Chest was clear.  She was having more anxiety.  No abdominal distention or significant ascites.  She will resume her prior home dose of torsemide  and need to have a close follow-up with her providers for further assistance.  She will continue on current medications and need to have a close follow-up with her providers for further assistance.  Palliative care was also consulted at her request and significant life  limiting comorbidities.  They will follow-up as outpatient.

## 2023-08-28 NOTE — ED Notes (Signed)
 Pt assisted to bedside commode. Moderate mushy brown BM present. Assisted pt to wipe and

## 2023-08-29 ENCOUNTER — Telehealth: Payer: Self-pay | Admitting: Family

## 2023-08-29 NOTE — Telephone Encounter (Signed)
 Called to confirm/remind patient of their appointment at the [facility] on 09/01/23.   Appointment:   [] Confirmed  [x] Left mess   [] No answer/No voice mail  [] VM Full/unable to leave message  [] Phone not in service  Patient reminded to bring all medications and/or complete list.  Confirmed patient has transportation. Gave directions, instructed to utilize valet parking.

## 2023-08-31 NOTE — Progress Notes (Unsigned)
 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   Admitted 08/01/23 due to intractable nausea vomiting. At discharge, her torsemide  was on hold. She was restarted on torsemide  recently but at a lower dose of 20 mg daily.   Was in the ED 08/27/23 with shortness of breath and pedal edema. Increased confusion since the night prior. BNP slightly elevated stable from prior.  Potassium low at 2.4 CBC shows stable low white count stable low hemoglobin.  Troponin is elevated. Initially IV diuresed. Palliative care consulted and she was discharged the next day.   She presents today, with her daughter, for her initial visit with a chief complaint of shortness of breath. Has associated fatigue, dizziness when fatigued, pedal edema (improving). Decreased appetite. She says that she's having some burning when she urinates and is wondering if she may have a UTI. Weighing daily and not adding salt to her food.   Review of Systems: [y] = yes, [ ]  = no   General: Weight gain [ ] ; Weight loss [ ] ; Anorexia [ ] ; Fatigue Davis.Dad ]; Fever [ ] ; Chills [ ] ; Weakness [ ]   Cardiac: Chest pain/pressure [ ] ; Resting SOB [ ] ; Exertional SOB [y]; Orthopnea [ ] ; Pedal Edema [ y]; Palpitations [ ] ; Syncope [ ] ; Presyncope [ ] ; Paroxysmal nocturnal dyspnea[ ]    Pulmonary: Cough [ ] ; Wheezing[ ] ; Hemoptysis[ ] ; Sputum [ ] ; Snoring [ ]   GI: Vomiting[ ] ; Dysphagia[ ] ; Melena[ ] ; Hematochezia [ ] ; Heartburn[ ] ; Abdominal pain [ ] ; Constipation [ ] ; Diarrhea [ ] ; BRBPR [ ]   GU: Hematuria[ ] ; Dysuria [ ] ; Nocturia[ ]   Vascular: Pain in legs with walking [ ] ; Pain in feet with lying flat [ ] ; Non-healing sores [ ] ; Stroke [ ] ; TIA [ ] ; Slurred speech [ ] ;  Neuro: Dizziness[y ]; Vertigo[ ] ; Seizures[ ] ; Paresthesias[ ] ;Blurred vision [ ] ; Diplopia [ ] ; Vision changes [ ]   Ortho/Skin: Arthritis [ ] ; Joint pain [ ] ; Muscle pain [ ] ; Joint swelling [ ] ; Back Pain [ ] ; Rash [ ]   Psych: Depression[ ] ; Anxiety[ ]   Heme: Bleeding problems [ ] ; Clotting disorders [ ] ; Anemia [ ]   Endocrine: Diabetes Davis.Dad ]; Thyroid  dysfunction[ ]    Past Medical History:  Diagnosis Date   Abdominal pain    Allergy    Cancer (HCC) 2017   breast cancer- Left   Cataract    CHF (congestive heart failure) (HCC)    Collagenous colitis    Coronary artery disease    Diabetes mellitus without complication (HCC)    Diarrhea    Diverticulosis    Dysrhythmia    Fatty liver disease, nonalcoholic    Fibrocystic breast    GERD (gastroesophageal reflux disease)    Heart murmur    Hyperlipidemia    Hypertension    Hypothyroidism    IBS (irritable bowel syndrome)    IDA (iron   deficiency anemia)    Liver cirrhosis (HCC)    Personal history of radiation therapy 2017   LEFT BREAST CA   Presence of permanent cardiac pacemaker    Sleep apnea    C-Pap    Current Outpatient Medications  Medication Sig Dispense Refill   Cholecalciferol  (VITAMIN D ) 50 MCG (2000 UT) CAPS Take 2,000 Units by mouth daily.     clobetasol  ointment (TEMOVATE ) 0.05 % Apply 1 application topically as needed (vaginal irritation).     clotrimazole  (LOTRIMIN ) 1 % cream Apply 1 Application topically 2 (two) times daily as needed.     cyanocobalamin (,VITAMIN B-12,) 1000 MCG/ML injection 1,000 mcg every 30 (thirty)  days.     EPINEPHrine  0.3 mg/0.3 mL IJ SOAJ injection Inject 0.3 mg into the muscle.     esomeprazole (NEXIUM) 40 MG capsule Take 40 mg by mouth 2 (two) times daily before a meal.      fenofibrate  160 MG tablet Take 160 mg by mouth at bedtime.     gabapentin  (NEURONTIN ) 300 MG capsule Take 600 mg by mouth at bedtime.     Insulin  Aspart FlexPen (NOVOLOG ) 100 UNIT/ML as directed. INJECT UP TO 168 UNITS DAILY AS DIRECTED     insulin  degludec (TRESIBA ) 200 UNIT/ML FlexTouch Pen Inject 50 Units into the skin daily.     lactulose  (CHRONULAC ) 10 GM/15ML solution Take 45 mLs (30 g total) by mouth every 6 (six) hours.     levocetirizine (XYZAL) 5 MG tablet Take 5 mg by mouth every evening.     levothyroxine  (SYNTHROID ) 50 MCG tablet Take 50 mcg by mouth. Take 1 tablet (50 mcg total) by mouth once daily     magnesium  oxide (MAG-OX) 400 MG tablet Take 400 mg by mouth 2 (two) times daily.      ondansetron  (ZOFRAN -ODT) 4 MG disintegrating tablet Take 1 tablet (4 mg total) by mouth every 8 (eight) hours as needed for nausea or vomiting. 30 tablet 0   potassium chloride  (KLOR-CON ) 20 MEQ packet 1 packet with food Orally Once a day; Duration: 30 day(s)     rifaximin  (XIFAXAN ) 550 MG TABS tablet Take 550 mg by mouth. take 1 tablet (550 mg total) by mouth 2 (two) times daily for 120 days     simvastatin  (ZOCOR ) 20 MG tablet Take 20 mg by mouth at bedtime.     spironolactone  (ALDACTONE ) 50 MG tablet Take 1 tablet (50 mg total) by mouth daily.     tirzepatide  (MOUNJARO ) 15 MG/0.5ML Pen Inject 15 mg into the skin once a week. On Fridays     torsemide  (DEMADEX ) 20 MG tablet Take 3 tablets (60 mg total) by mouth daily. (Patient taking differently: Take 20 mg by mouth 3 (three) times daily.) 270 tablet 3   traMADol  (ULTRAM ) 50 MG tablet Take 1 tablet (50 mg total) by mouth every 8 (eight) hours as needed. (Patient taking differently: Take 50 mg by mouth every 8 (eight) hours as needed for moderate pain (pain score 4-6).)  60 tablet 0   valACYclovir  (VALTREX ) 500 MG tablet Take 500 mg by mouth 2 (two) times daily as needed.     No current facility-administered medications for this visit.    Allergies  Allergen Reactions   Codeine Itching, Nausea And Vomiting and Other (See Comments)   Demeclocycline Rash   Demerol [Meperidine] Itching and Nausea And Vomiting   Hydrocodone  Itching and Nausea And Vomiting   Morphine  Nausea Only   Other Other (See Comments)  Oxycodone  Itching and Nausea And Vomiting   Pentazocine Itching, Nausea And Vomiting and Other (See Comments)   Tetracyclines & Related Rash   Coal Tar Extract Other (See Comments)   Hydrocodone -Acetaminophen  Other (See Comments)   Salicylic Acid Other (See Comments)   Tetracycline Hcl Other (See Comments)      Social History   Socioeconomic History   Marital status: Married    Spouse name: Alexa   Number of children: Not on file   Years of education: Not on file   Highest education level: Not on file  Occupational History   Not on file  Tobacco Use   Smoking status: Never   Smokeless tobacco: Never  Vaping Use   Vaping status: Never Used  Substance and Sexual Activity   Alcohol use: Never   Drug use: Never   Sexual activity: Not Currently    Birth control/protection: Surgical  Other Topics Concern   Not on file  Social History Narrative    Husband lives at nursing home , lives with husbands sister   Social Drivers of Health   Financial Resource Strain: Medium Risk (07/17/2023)   Received from [facility]   Overall Financial Resource Strain (CARDIA)    Difficulty of Paying Living Expenses: Somewhat hard  Food Insecurity: No Food Insecurity (08/07/2023)   Received from [facility]   Hunger Vital Sign    Within the past 12 months, you worried that your food would run out before you got the money to buy more.: Never true    Within the past 12 months, the food you bought just didn't last and you  didn't have money to get more.: Never true  Transportation Needs: No Transportation Needs (08/07/2023)   Received from [facility] - Transportation    In the past 12 months, has lack of transportation kept you from medical appointments or from getting medications?: No    Lack of Transportation (Non-Medical): No  Physical Activity: Unknown (01/30/2018)   Received from [facility]   Exercise Vital Sign    Days of Exercise per Week: 7 days    Minutes of Exercise per Session: Not on file  Stress: Not on file  Social Connections: Moderately Isolated (08/01/2023)   Social Connection and Isolation Panel    Frequency of Communication with Friends and Family: Once a week    Frequency of Social Gatherings with Friends and Family: Once a week    Attends Religious Services: More than 4 times per year    Active Member of [employer] or Organizations: No    Attends Banker Meetings: Never    Marital Status: Married  Catering manager Violence: Not At Risk (08/01/2023)   Humiliation, Afraid, Rape, and Kick questionnaire    Fear of Current or Ex-Partner: No    Emotionally Abused: No    Physically Abused: No    Sexually Abused: No      Family History  Problem Relation Age of Onset   Breast cancer Paternal Grandmother    Colon cancer Father    Diabetes Sister    Diabetes Brother    Heart disease Brother    Prostate cancer Brother    Colon cancer Maternal Uncle    Prostate cancer Brother    Bladder Cancer Brother    Leukemia Mother        all   Ovarian cancer Neg Hx    Kidney cancer Neg Hx    Vitals:  09/01/23 1602  BP: (!) 115/45  Pulse: 65  SpO2: 99%  Weight: 131 lb (59.4 kg)   Wt Readings from Last 3 Encounters:  09/01/23 131 lb (59.4 kg)  08/28/23 133 lb 4.8 oz (60.5 kg)  08/01/23 128 lb 14.4 oz (58.5 kg)   Lab Results  Component Value Date   CREATININE 0.75 08/28/2023   CREATININE 0.86 08/27/2023   CREATININE 0.56 08/03/2023     PHYSICAL EXAM:  General: Well appearing.  Cor: No JVD. Regular rhythm, rate.  Lungs: clear Abdomen: soft, nontender, nondistended. Extremities: 1+ pitting edema right lower leg, trace pitting edema in left lower leg Neuro:. 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 - NYHA class III - euvolemic - weighing daily; parameters to call about discussed - Echo 10/07/22: EF 55-60%, severe LVH, G2DD, normal RV, severe LAE, mild MR with mean mitral valve gradient of 4.0 mmHg - will get echo updated; consider cMRI - continue spironolactone  50mg  daily - continue torsemide  60mg  daily/ potassium packet daily - wear compression socks daily - discussed SLGT2 but she's currently having urinary symptoms so will defer today - BNP 08/27/23 was 228.7  2: HTN- - BP 115/45 - saw PCP - BMET 08/28/23 reviewed: sodium 142, potassium 2.9 (rechecked was 3.4), creatinine 0.75 & GFR >60 - BMET today  3: PAF- - EKG paced HR 70 - rate controlled today  4: Hyperlpidemia- - LDL 11/30/20 was 106. Lipid panel today - continue fenofibrate  160mg  daily - continue simvastatin  20mg  daily  5: NASH- - continue lactulose  - continue rifaximin  550mg  daily - continue spironolactone  50mg  daily  6: T2DM- - continue insulin  - A1c 03/03/23 was 5.6%   Pillbox provided to patient today. Return in 3 weeks, sooner if needed.   Ellouise DELENA Class, FNP 08/31/23

## 2023-09-01 ENCOUNTER — Encounter: Payer: Self-pay | Admitting: Family

## 2023-09-01 ENCOUNTER — Other Ambulatory Visit
Admission: RE | Admit: 2023-09-01 | Discharge: 2023-09-01 | Disposition: A | Discharge status: Home / Self Care | Attending: Family | Admitting: Family

## 2023-09-01 ENCOUNTER — Ambulatory Visit (HOSPITAL_BASED_OUTPATIENT_CLINIC_OR_DEPARTMENT_OTHER): Admitting: Family

## 2023-09-01 VITALS — BP 115/45 | HR 65 | Wt 131.0 lb

## 2023-09-01 DIAGNOSIS — I4891 Unspecified atrial fibrillation: Secondary | ICD-10-CM | POA: Diagnosis not present

## 2023-09-01 DIAGNOSIS — E119 Type 2 diabetes mellitus without complications: Secondary | ICD-10-CM | POA: Diagnosis not present

## 2023-09-01 DIAGNOSIS — I10 Essential (primary) hypertension: Secondary | ICD-10-CM

## 2023-09-01 DIAGNOSIS — I5032 Chronic diastolic (congestive) heart failure: Secondary | ICD-10-CM

## 2023-09-01 DIAGNOSIS — I110 Hypertensive heart disease with heart failure: Secondary | ICD-10-CM | POA: Diagnosis not present

## 2023-09-01 DIAGNOSIS — I480 Paroxysmal atrial fibrillation: Secondary | ICD-10-CM

## 2023-09-01 DIAGNOSIS — E782 Mixed hyperlipidemia: Secondary | ICD-10-CM | POA: Diagnosis not present

## 2023-09-01 DIAGNOSIS — Z923 Personal history of irradiation: Secondary | ICD-10-CM | POA: Diagnosis not present

## 2023-09-01 DIAGNOSIS — Z950 Presence of cardiac pacemaker: Secondary | ICD-10-CM | POA: Diagnosis not present

## 2023-09-01 DIAGNOSIS — Z5986 Financial insecurity: Secondary | ICD-10-CM | POA: Insufficient documentation

## 2023-09-01 DIAGNOSIS — K7581 Nonalcoholic steatohepatitis (NASH): Secondary | ICD-10-CM

## 2023-09-01 DIAGNOSIS — Z7985 Long-term (current) use of injectable non-insulin antidiabetic drugs: Secondary | ICD-10-CM | POA: Insufficient documentation

## 2023-09-01 DIAGNOSIS — R0602 Shortness of breath: Secondary | ICD-10-CM | POA: Diagnosis present

## 2023-09-01 DIAGNOSIS — G4733 Obstructive sleep apnea (adult) (pediatric): Secondary | ICD-10-CM | POA: Diagnosis not present

## 2023-09-01 DIAGNOSIS — I428 Other cardiomyopathies: Secondary | ICD-10-CM | POA: Diagnosis not present

## 2023-09-01 DIAGNOSIS — Z794 Long term (current) use of insulin: Secondary | ICD-10-CM | POA: Insufficient documentation

## 2023-09-01 DIAGNOSIS — D649 Anemia, unspecified: Secondary | ICD-10-CM | POA: Diagnosis not present

## 2023-09-01 DIAGNOSIS — Z79899 Other long term (current) drug therapy: Secondary | ICD-10-CM | POA: Insufficient documentation

## 2023-09-01 LAB — LIPID PANEL
Cholesterol: 93 mg/dL (ref 0–200)
HDL: 28 mg/dL — ABNORMAL LOW (ref >40)
LDL Cholesterol: 53 mg/dL (ref 0–99)
Total CHOL/HDL Ratio: 3.3 ratio
Triglycerides: 61 mg/dL (ref <150)
VLDL: 12 mg/dL (ref 0–40)

## 2023-09-01 LAB — BASIC METABOLIC PANEL WITH GFR
Anion gap: 9 (ref 5–15)
BUN: 12 mg/dL (ref 8–23)
CO2: 30 mmol/L (ref 22–32)
Calcium: 8.3 mg/dL — ABNORMAL LOW (ref 8.9–10.3)
Chloride: 95 mmol/L — ABNORMAL LOW (ref 98–111)
Creatinine, Ser: 0.7 mg/dL (ref 0.44–1.00)
GFR, Estimated: >60 mL/min (ref >60)
Glucose, Bld: 144 mg/dL — ABNORMAL HIGH (ref 70–99)
Potassium: 2.6 mmol/L — CL (ref 3.5–5.1)
Sodium: 134 mmol/L — ABNORMAL LOW (ref 135–145)

## 2023-09-01 NOTE — Patient Instructions (Addendum)
 It was a pleasure meeting you today.   Medication Changes:  No medication changes today!  Lab Work:  Go over to the [facility]. Go pass the gift shop and have your blood work completed.  We will only call you if the results are abnormal or if the provider would like to make medication changes.   Follow-Up in: Please follow up with the [facility] Clinic in 3 weeks with Ellouise Class, FNP  At the [facility], you and your health needs are our priority. We have a designated team specialized in the treatment of Heart Failure. This Care Team includes your primary Heart Failure Specialized Cardiologist (physician), Advanced Practice Providers (APPs- Physician Assistants and Nurse Practitioners), and Pharmacist who all work together to provide you with the care you need, when you need it.   You may see any of the following providers on your designated Care Team at your next follow up:  Dr. Toribio Fuel Dr. Ezra Shuck Dr. Ria Commander Dr. Odis Brownie Ellouise Class, FNP Jaun Bash, RPH-CPP  Please be sure to bring in all your medications bottles to every appointment.   Need to Contact Us :  If you have any questions or concerns before your next appointment please send us  a message through [place] or call our office at [phone].    TO LEAVE A MESSAGE FOR THE NURSE SELECT OPTION 2, PLEASE LEAVE A MESSAGE INCLUDING: YOUR NAME DATE OF BIRTH CALL BACK NUMBER REASON FOR CALL**this is important as we prioritize the call backs  YOU WILL RECEIVE A CALL BACK THE SAME DAY AS LONG AS YOU CALL BEFORE 4:00 PM

## 2023-09-02 ENCOUNTER — Ambulatory Visit: Payer: Self-pay | Admitting: Family

## 2023-09-02 DIAGNOSIS — I5032 Chronic diastolic (congestive) heart failure: Secondary | ICD-10-CM

## 2023-09-02 DIAGNOSIS — E876 Hypokalemia: Secondary | ICD-10-CM

## 2023-09-02 DIAGNOSIS — R0602 Shortness of breath: Secondary | ICD-10-CM

## 2023-09-02 NOTE — Addendum Note (Signed)
 Addended by: Osmara Drummonds A on: 09/02/2023 03:38 PM   Modules accepted: Orders

## 2023-09-02 NOTE — Telephone Encounter (Addendum)
-----   Message from Ellouise DELENA Class sent at 09/02/2023  7:45 AM EDT ----- Please call patient to review my mychart message to her. I also called her last night and left a message. If she did not take the extra potassium yesterday per my phone call, she needs to take 40meq  BID for 3 days. If she took the extra yesterday, she needs to take BID for 2 more days. BMET needs done on Thursday.  ----- Message ----- From: Rebecka, Lab In [place] Sent: 09/01/2023   5:14 PM EDT To: Ellouise DELENA Class, FNP

## 2023-09-02 NOTE — Telephone Encounter (Signed)
 Pt aware, agreeable, and verbalized understanding. She verbalized she will take two 20 meq potassium tablets two times a day, for a total of 4 daily for the next 3 days and stated she will return either Thursday or Friday morning to recheck lab work. She denied any further questions at this time. She did say she has been unnaturally tired and sleeping so much and wanted the provider to know. Ellouise Class, NP, notified and gave verbal order to add a CBC to the labs for recheck. Lab order for CBC and BMET placed.

## 2023-09-04 ENCOUNTER — Other Ambulatory Visit
Admission: RE | Admit: 2023-09-04 | Discharge: 2023-09-04 | Disposition: A | Discharge status: Home / Self Care | Source: Ambulatory Visit | Attending: Family | Admitting: Family

## 2023-09-04 ENCOUNTER — Ambulatory Visit: Payer: Self-pay | Admitting: Family

## 2023-09-04 DIAGNOSIS — R0602 Shortness of breath: Secondary | ICD-10-CM | POA: Insufficient documentation

## 2023-09-04 DIAGNOSIS — I5032 Chronic diastolic (congestive) heart failure: Secondary | ICD-10-CM

## 2023-09-04 DIAGNOSIS — E876 Hypokalemia: Secondary | ICD-10-CM | POA: Insufficient documentation

## 2023-09-04 LAB — CBC
HCT: 30.8 % — ABNORMAL LOW (ref 36.0–46.0)
Hemoglobin: 10.8 g/dL — ABNORMAL LOW (ref 12.0–15.0)
MCH: 31.9 pg (ref 26.0–34.0)
MCHC: 35.1 g/dL (ref 30.0–36.0)
MCV: 90.9 fL (ref 80.0–100.0)
Platelets: 111 K/uL — ABNORMAL LOW (ref 150–400)
RBC: 3.39 MIL/uL — ABNORMAL LOW (ref 3.87–5.11)
RDW: 15.7 % — ABNORMAL HIGH (ref 11.5–15.5)
WBC: 4.6 K/uL (ref 4.0–10.5)
nRBC: 0 % (ref 0.0–0.2)

## 2023-09-04 LAB — BASIC METABOLIC PANEL WITH GFR
Anion gap: 7 (ref 5–15)
BUN: 13 mg/dL (ref 8–23)
CO2: 30 mmol/L (ref 22–32)
Calcium: 8.8 mg/dL — ABNORMAL LOW (ref 8.9–10.3)
Chloride: 101 mmol/L (ref 98–111)
Creatinine, Ser: 0.77 mg/dL (ref 0.44–1.00)
GFR, Estimated: >60 mL/min (ref >60)
Glucose, Bld: 94 mg/dL (ref 70–99)
Potassium: 3.2 mmol/L — ABNORMAL LOW (ref 3.5–5.1)
Sodium: 138 mmol/L (ref 135–145)

## 2023-09-05 MED ORDER — POTASSIUM CHLORIDE 20 MEQ PO PACK: 60.0000 meq | PACK | Freq: Every day | ORAL | 3 refills | Status: DC

## 2023-09-05 NOTE — Telephone Encounter (Signed)
 Spoke with pt and daughter Cherlyn about lab results and new orders. Pt agreeable to getting lab work done on 09/19/23. Pt would like to stop by the office the day of to get the requisition.

## 2023-09-05 NOTE — Telephone Encounter (Signed)
-----   Message from Ellouise DELENA Class sent at 09/05/2023  1:31 PM EDT ----- Please call with wilhelminia gazella message ----- Message ----- From: Rebecka, Lab In [place] Sent: 09/04/2023  12:39 PM EDT To: Ellouise DELENA Class, FNP

## 2023-09-09 ENCOUNTER — Ambulatory Visit: Attending: Medical | Admitting: Medical

## 2023-09-09 ENCOUNTER — Encounter: Payer: Self-pay | Admitting: Medical

## 2023-09-09 ENCOUNTER — Other Ambulatory Visit: Payer: Self-pay | Admitting: Internal Medicine

## 2023-09-09 VITALS — BP 99/52 | HR 60 | Ht 63.0 in | Wt 137.4 lb

## 2023-09-09 DIAGNOSIS — I480 Paroxysmal atrial fibrillation: Secondary | ICD-10-CM | POA: Diagnosis not present

## 2023-09-09 DIAGNOSIS — Z950 Presence of cardiac pacemaker: Secondary | ICD-10-CM

## 2023-09-09 DIAGNOSIS — K7581 Nonalcoholic steatohepatitis (NASH): Secondary | ICD-10-CM

## 2023-09-09 DIAGNOSIS — I5032 Chronic diastolic (congestive) heart failure: Secondary | ICD-10-CM | POA: Diagnosis not present

## 2023-09-09 DIAGNOSIS — I517 Cardiomegaly: Secondary | ICD-10-CM

## 2023-09-09 DIAGNOSIS — K7460 Unspecified cirrhosis of liver: Secondary | ICD-10-CM

## 2023-09-09 MED ORDER — METOLAZONE 2.5 MG PO TABS: 2.5000 mg | ORAL_TABLET | ORAL | 3 refills | Status: DC

## 2023-09-09 NOTE — Patient Instructions (Addendum)
 Medication Instructions:  Your physician recommends the following medication changes.  START TAKING: Metolazone  2.5 mg by mouth twice weekly on Monday and Friday  *If you need a refill on your cardiac medications before your next appointment, please call your pharmacy*  Lab Work: No labs ordered today    Testing/Procedures: No test ordered today   Follow-Up: At [facility], you and your health needs are our priority.  As part of our continuing mission to provide you with exceptional heart care, our providers are all part of one team.  This team includes your primary Cardiologist (physician) and Advanced Practice Providers or APPs (Physician Assistants and Nurse Practitioners) who all work together to provide you with the care you need, when you need it.  Your next appointment:   2-3 month(s)  Provider:   Lonni Hanson, MD or Cadence Franchester, PA-C

## 2023-09-09 NOTE — Progress Notes (Signed)
 Cardiology Office Note   Date:  09/09/2023  ID:  Annette Foster, Annette Foster 06-Nov-1940, MRN [id number] PCP: Alla Amis, MD  [facility] Providers Cardiologist:  Lonni Hanson, MD Electrophysiologist:  OLE ONEIDA HOLTS, MD   History of Present Illness Annette Foster is a 83 y.o. female  with a hx of remote valve surgery in the 1970s, HFpEF, paroxysmal afib complicated by GIB not on a/c, HTN, HLD, NASH, seizure disorder, OSA, left breast cancer s/p mastectomy who presents for follow-up.   Seen by EP for watchman, however left atrial appendage morphology precludes watchman placement. Anticoagulation was not pursued given h/o GIB with esophageal varices.    The patient was referred back to EP for AV junction ablation and  permanent pacemaker. When in afib patient very symptomatic with RVR, when in NSR she is brardycardic. She underwent device implant 03/23/21. Patient underwent AV junction ablation 8 weeks later.  She was not felt to be a good candidate for watchman due to inappropriate left atrial appendage anatomy.   The patient was admitted in July 2024 for Acute GI bleed.  She woke up noting spontaneous rectal bleeding.  She was admitted to the hospital and seen by GI.  She underwent EGD and colonoscopy.  EGD showed portal hypertension gastropathy, grade 3 esophageal varices.  Colonoscopy showed 2 polyps which were resected the descending colon and 1 polyp in ascending colon resected.  GI recommended IV octreotide  drip for 72 hours and empiric antibiotics for 5 days.    Patient wasseen in August 2024 reporting she needed another EGD.  She reported episode of chest pain and shortness of breath so echo and cardiac CTA were ordered. Patient was unable to do cardiac CTA due to inability to lower HR with low BP. Echo showed LVEF 55-60%, G2Dd, severely dilated LA, mild MR with normally functioning valve.    Cardiac MRI October 2024 showed no evidence of infiltrative disease, basal  septal hypertrophy of aging versus nonobstructive hypertrophic cardiomyopathy with no high risk features, severe left atrial dilation, mild tricuspid regurgitation around device lead, mitral valve repair.   Patient was admitted in mid December 2024 for acute GI bleeding, sepsis due to pneumonia, hypokalemia, hyponatremia.  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  She saw Dr. HOLTS for EP follow-up in March, at which time she was doing well. The office was notified of atrial flutter with RVR on device  interrogation.   The patient was last seen 06/26/23 reporting leg swelling and weight gain of 9 lbs. Torsemide  was increased to 60mg  daily.   She was admitted 7/16-7/17 with SOB and Lower leg edema. BNP 228. Hgb 9.3. K 2.4. HS trop 32>36. She was given supplemental K and IV lasix . She was discharged on Torsemide  60mg  daily and spiro 50mg  daily. Since that time she saw [facility], who increase potassium supplement. Repeat echo was ordered.   Today, the patient feels she is re-accumulating fluid again. She denies significant SOB. She denies chest pain. Weight 128lbs>136lbs. She has 1+ lower leg edema. She is teary-eyed as she is tired of seeing multiple doctors. She reports compliance with medications. No palpitations noted. She said they stopped midodrine  in the hospital.  Studies Reviewed EKG Interpretation Date/Time:  Tuesday September 09 2023 10:55:46 EDT Ventricular Rate:  60 PR Interval:    QRS Duration:  148 QT Interval:  478 QTC Calculation: 478 R Axis:   60  Text  Interpretation: Ventricular-paced rhythm When compared with ECG of 01-Sep-2023 15:18, Vent. rate has decreased BY  10 BPM Confirmed by Franchester, Loren Vicens ([id number]) on 09/09/2023 11:57:54 AM    cMRI 11/2022 CONCLUSION: 1. No evidence of infiltrative disease. In the absence of system disease, differential includes basal septal hypertrophy of  aging vs non obstructive hypertrophic cardiomyopathy with no high risk features.   2.  Severe left atrial dilation.   3.  Mild tricuspid regurgitation around device lead.   4. Mitral valve appears post inflammatory with posterior restriction. There is no localized signal void artifact. Prior, undisclosed mitral valve surgery may have been a repair.   Stanly Leavens MD     Echo 09/2022 1. No LVOT obstruction. Left ventricular ejection fraction, by  estimation, is 55 to 60%. The left ventricle has normal function. The left  ventricle has no regional wall motion abnormalities. There is severe  asymmetric left ventricular hypertrophy of the   septal segment. Left ventricular diastolic parameters are consistent with  Grade II diastolic dysfunction (pseudonormalization).   2. Right ventricular systolic function is normal. The right ventricular  size is normal.   3. Left atrial size was severely dilated.   4. The mitral valve has been repaired/replaced. Mild mitral valve  regurgitation. The mean mitral valve gradient is 4.0 mmHg.   5. The aortic valve is tricuspid. Aortic valve regurgitation is not  visualized. Aortic valve sclerosis/calcification is present, without any  evidence of aortic stenosis.       Physical Exam VS:  BP (!) 99/52 (BP Location: Right Arm, Patient Position: Sitting, Cuff Size: Normal)   Pulse 60   Ht 5' 3 (1.6 m)   Wt 137 lb 6.4 oz (62.3 kg)   LMP  (LMP Unknown)   SpO2 100%   BMI 24.34 kg/m        Wt Readings from Last 3 Encounters:  09/09/23 137 lb 6.4 oz (62.3 kg)  09/01/23 131 lb (59.4 kg)  08/28/23 133 lb 4.8 oz (60.5 kg)    GEN: Well nourished, well developed in no acute distress NECK: No JVD; No carotid bruits CARDIAC: RRR, no murmurs, rubs, gallops RESPIRATORY:  Clear to auscultation without rales, wheezing or rhonchi  ABDOMEN: Soft, non-tender, non-distended EXTREMITIES:  1+ lower leg edema; No deformity   ASSESSMENT AND  PLAN  Paroxysmal Afib s/p AV ablation and PPM Patient denies palpitations. EKG shows V paced rhythm and possible underlying aflutter. She is not on a/c due to h/o GIB in the setting of chronic liver disease and recurrent falls. Management per EP.   LLE HFpEF Lower leg edema likely multifactorial given CHF, low albumin  (2.2) and cirrhosis issues. She is taking torsemide  60mg  daily and spironolactone  50mg  daily. She was recently in the hospital for 1 day for IV diuresis. Since then fluid has re-accumulated and weight has gone up 8 lbs. I will start metolazone  2.5mg  Monday and Friday. Monitor BP, may need to go back on midodrine . She seens Ellouise Class back in 2 weeks. Repeat echo has been ordered by [facility].   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   Hypokalemia [facility] clinic increased potassium and will re-check at follow-up. K 3.2 on most recent lab.  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 No abdominal swelling noted.       Dispo: Follow-up in 2-3 months  Signed, Karista Aispuro VEAR Franchester, PA-C

## 2023-09-13 ENCOUNTER — Emergency Department

## 2023-09-13 ENCOUNTER — Other Ambulatory Visit: Payer: Self-pay

## 2023-09-13 ENCOUNTER — Inpatient Hospital Stay
Admission: EM | Admit: 2023-09-13 | Discharge: 2023-09-17 | DRG: 442 | Disposition: A | Discharge status: Home / Self Care | Attending: Internal Medicine | Admitting: Internal Medicine

## 2023-09-13 DIAGNOSIS — F419 Anxiety disorder, unspecified: Secondary | ICD-10-CM | POA: Diagnosis present

## 2023-09-13 DIAGNOSIS — Z7985 Long-term (current) use of injectable non-insulin antidiabetic drugs: Secondary | ICD-10-CM

## 2023-09-13 DIAGNOSIS — Z881 Allergy status to other antibiotic agents status: Secondary | ICD-10-CM

## 2023-09-13 DIAGNOSIS — Z800 Family history of malignant neoplasm of digestive organs: Secondary | ICD-10-CM

## 2023-09-13 DIAGNOSIS — Z9012 Acquired absence of left breast and nipple: Secondary | ICD-10-CM

## 2023-09-13 DIAGNOSIS — Z8701 Personal history of pneumonia (recurrent): Secondary | ICD-10-CM

## 2023-09-13 DIAGNOSIS — Z950 Presence of cardiac pacemaker: Secondary | ICD-10-CM

## 2023-09-13 DIAGNOSIS — R54 Age-related physical debility: Secondary | ICD-10-CM | POA: Diagnosis present

## 2023-09-13 DIAGNOSIS — L89151 Pressure ulcer of sacral region, stage 1: Secondary | ICD-10-CM | POA: Diagnosis present

## 2023-09-13 DIAGNOSIS — Z923 Personal history of irradiation: Secondary | ICD-10-CM

## 2023-09-13 DIAGNOSIS — Z794 Long term (current) use of insulin: Secondary | ICD-10-CM

## 2023-09-13 DIAGNOSIS — I5032 Chronic diastolic (congestive) heart failure: Secondary | ICD-10-CM | POA: Diagnosis present

## 2023-09-13 DIAGNOSIS — R4182 Altered mental status, unspecified: Secondary | ICD-10-CM

## 2023-09-13 DIAGNOSIS — Z8042 Family history of malignant neoplasm of prostate: Secondary | ICD-10-CM

## 2023-09-13 DIAGNOSIS — K7581 Nonalcoholic steatohepatitis (NASH): Secondary | ICD-10-CM | POA: Diagnosis present

## 2023-09-13 DIAGNOSIS — K7682 Hepatic encephalopathy: Principal | ICD-10-CM | POA: Diagnosis present

## 2023-09-13 DIAGNOSIS — Z853 Personal history of malignant neoplasm of breast: Secondary | ICD-10-CM

## 2023-09-13 DIAGNOSIS — Z888 Allergy status to other drugs, medicaments and biological substances status: Secondary | ICD-10-CM

## 2023-09-13 DIAGNOSIS — Z86008 Personal history of in-situ neoplasm of other site: Secondary | ICD-10-CM

## 2023-09-13 DIAGNOSIS — E785 Hyperlipidemia, unspecified: Secondary | ICD-10-CM | POA: Diagnosis present

## 2023-09-13 DIAGNOSIS — K766 Portal hypertension: Secondary | ICD-10-CM | POA: Diagnosis present

## 2023-09-13 DIAGNOSIS — E039 Hypothyroidism, unspecified: Secondary | ICD-10-CM | POA: Diagnosis present

## 2023-09-13 DIAGNOSIS — Z8052 Family history of malignant neoplasm of bladder: Secondary | ICD-10-CM

## 2023-09-13 DIAGNOSIS — I2510 Atherosclerotic heart disease of native coronary artery without angina pectoris: Secondary | ICD-10-CM | POA: Diagnosis present

## 2023-09-13 DIAGNOSIS — Z5986 Financial insecurity: Secondary | ICD-10-CM

## 2023-09-13 DIAGNOSIS — Z66 Do not resuscitate: Secondary | ICD-10-CM | POA: Diagnosis not present

## 2023-09-13 DIAGNOSIS — K7290 Hepatic failure, unspecified without coma: Secondary | ICD-10-CM | POA: Diagnosis present

## 2023-09-13 DIAGNOSIS — Z833 Family history of diabetes mellitus: Secondary | ICD-10-CM

## 2023-09-13 DIAGNOSIS — Z96652 Presence of left artificial knee joint: Secondary | ICD-10-CM | POA: Diagnosis present

## 2023-09-13 DIAGNOSIS — Z803 Family history of malignant neoplasm of breast: Secondary | ICD-10-CM

## 2023-09-13 DIAGNOSIS — E119 Type 2 diabetes mellitus without complications: Secondary | ICD-10-CM | POA: Diagnosis present

## 2023-09-13 DIAGNOSIS — Z79899 Other long term (current) drug therapy: Secondary | ICD-10-CM

## 2023-09-13 DIAGNOSIS — Z8249 Family history of ischemic heart disease and other diseases of the circulatory system: Secondary | ICD-10-CM

## 2023-09-13 DIAGNOSIS — K7460 Unspecified cirrhosis of liver: Secondary | ICD-10-CM | POA: Diagnosis present

## 2023-09-13 DIAGNOSIS — E876 Hypokalemia: Secondary | ICD-10-CM | POA: Diagnosis present

## 2023-09-13 DIAGNOSIS — K589 Irritable bowel syndrome without diarrhea: Secondary | ICD-10-CM | POA: Diagnosis present

## 2023-09-13 DIAGNOSIS — Z885 Allergy status to narcotic agent status: Secondary | ICD-10-CM

## 2023-09-13 DIAGNOSIS — Z806 Family history of leukemia: Secondary | ICD-10-CM

## 2023-09-13 DIAGNOSIS — I480 Paroxysmal atrial fibrillation: Secondary | ICD-10-CM | POA: Diagnosis present

## 2023-09-13 DIAGNOSIS — Z79890 Hormone replacement therapy (postmenopausal): Secondary | ICD-10-CM

## 2023-09-13 DIAGNOSIS — I110 Hypertensive heart disease with heart failure: Secondary | ICD-10-CM | POA: Diagnosis present

## 2023-09-13 DIAGNOSIS — Z9049 Acquired absence of other specified parts of digestive tract: Secondary | ICD-10-CM

## 2023-09-13 DIAGNOSIS — D61818 Other pancytopenia: Secondary | ICD-10-CM | POA: Diagnosis present

## 2023-09-13 DIAGNOSIS — L8990 Pressure ulcer of unspecified site, unspecified stage: Secondary | ICD-10-CM | POA: Insufficient documentation

## 2023-09-13 DIAGNOSIS — I9589 Other hypotension: Secondary | ICD-10-CM | POA: Diagnosis present

## 2023-09-13 DIAGNOSIS — G4733 Obstructive sleep apnea (adult) (pediatric): Secondary | ICD-10-CM | POA: Diagnosis present

## 2023-09-13 DIAGNOSIS — Z90710 Acquired absence of both cervix and uterus: Secondary | ICD-10-CM

## 2023-09-13 LAB — COMPREHENSIVE METABOLIC PANEL WITH GFR
ALT: 17 U/L (ref 0–44)
AST: 49 U/L — ABNORMAL HIGH (ref 15–41)
Albumin: 2.8 g/dL — ABNORMAL LOW (ref 3.5–5.0)
Alkaline Phosphatase: 66 U/L (ref 38–126)
Anion gap: 14 (ref 5–15)
BUN: 21 mg/dL (ref 8–23)
CO2: 25 mmol/L (ref 22–32)
Calcium: 8.9 mg/dL (ref 8.9–10.3)
Chloride: 97 mmol/L — ABNORMAL LOW (ref 98–111)
Creatinine, Ser: 0.88 mg/dL (ref 0.44–1.00)
GFR, Estimated: >60 mL/min (ref >60)
Glucose, Bld: 105 mg/dL — ABNORMAL HIGH (ref 70–99)
Potassium: 3 mmol/L — ABNORMAL LOW (ref 3.5–5.1)
Sodium: 136 mmol/L (ref 135–145)
Total Bilirubin: 2.6 mg/dL — ABNORMAL HIGH (ref 0.0–1.2)
Total Protein: 6.9 g/dL (ref 6.5–8.1)

## 2023-09-13 LAB — CBC WITH DIFFERENTIAL/PLATELET
Abs Immature Granulocytes: 0.02 K/uL (ref 0.00–0.07)
Basophils Absolute: 0 K/uL (ref 0.0–0.1)
Basophils Relative: 1 %
Eosinophils Absolute: 0.2 K/uL (ref 0.0–0.5)
Eosinophils Relative: 5 %
HCT: 33.2 % — ABNORMAL LOW (ref 36.0–46.0)
Hemoglobin: 11.9 g/dL — ABNORMAL LOW (ref 12.0–15.0)
Immature Granulocytes: 0 %
Lymphocytes Relative: 43 %
Lymphs Abs: 2.1 K/uL (ref 0.7–4.0)
MCH: 32.2 pg (ref 26.0–34.0)
MCHC: 35.8 g/dL (ref 30.0–36.0)
MCV: 90 fL (ref 80.0–100.0)
Monocytes Absolute: 0.5 K/uL (ref 0.1–1.0)
Monocytes Relative: 10 %
Neutro Abs: 2 K/uL (ref 1.7–7.7)
Neutrophils Relative %: 41 %
Platelets: 112 K/uL — ABNORMAL LOW (ref 150–400)
RBC: 3.69 MIL/uL — ABNORMAL LOW (ref 3.87–5.11)
RDW: 14.9 % (ref 11.5–15.5)
WBC: 4.8 K/uL (ref 4.0–10.5)
nRBC: 0 % (ref 0.0–0.2)

## 2023-09-13 LAB — URINALYSIS, ROUTINE W REFLEX MICROSCOPIC
Bilirubin Urine: NEGATIVE
Glucose, UA: NEGATIVE mg/dL
Hgb urine dipstick: NEGATIVE
Ketones, ur: NEGATIVE mg/dL
Leukocytes,Ua: NEGATIVE
Nitrite: NEGATIVE
Protein, ur: NEGATIVE mg/dL
Specific Gravity, Urine: 1.011 (ref 1.005–1.030)
pH: 7 (ref 5.0–8.0)

## 2023-09-13 LAB — AMMONIA: Ammonia: 82 umol/L — ABNORMAL HIGH (ref 9–35)

## 2023-09-13 LAB — LIPASE, BLOOD: Lipase: 37 U/L (ref 11–51)

## 2023-09-13 LAB — CBG MONITORING, ED: Glucose-Capillary: 165 mg/dL — ABNORMAL HIGH (ref 70–99)

## 2023-09-13 LAB — MAGNESIUM: Magnesium: 2.4 mg/dL (ref 1.7–2.4)

## 2023-09-13 MED ORDER — POTASSIUM CHLORIDE 20 MEQ PO PACK
40.0000 meq | PACK | Freq: Once | ORAL | Status: AC
  Administered 2023-09-13: 40 meq via ORAL
  Filled 2023-09-13: qty 2

## 2023-09-13 MED ORDER — RIFAXIMIN 550 MG PO TABS
550.0000 mg | ORAL_TABLET | Freq: Two times a day (BID) | ORAL | Status: DC
  Administered 2023-09-13 – 2023-09-17 (×8): 550 mg via ORAL
  Filled 2023-09-13 (×9): qty 1

## 2023-09-13 MED ORDER — IOHEXOL 300 MG/ML  SOLN
100.0000 mL | Freq: Once | INTRAMUSCULAR | Status: AC | PRN
  Administered 2023-09-13: 100 mL via INTRAVENOUS

## 2023-09-13 MED ORDER — METOLAZONE 2.5 MG PO TABS
2.5000 mg | ORAL_TABLET | ORAL | Status: DC
  Administered 2023-09-15: 2.5 mg via ORAL
  Filled 2023-09-13: qty 1

## 2023-09-13 MED ORDER — ACETAMINOPHEN 325 MG PO TABS
650.0000 mg | ORAL_TABLET | Freq: Four times a day (QID) | ORAL | Status: DC | PRN
  Administered 2023-09-15: 650 mg via ORAL
  Filled 2023-09-13: qty 2

## 2023-09-13 MED ORDER — POTASSIUM CHLORIDE CRYS ER 20 MEQ PO TBCR: 40.0000 meq | EXTENDED_RELEASE_TABLET | Freq: Once | ORAL | Status: DC

## 2023-09-13 MED ORDER — ACETAMINOPHEN 650 MG RE SUPP: 650.0000 mg | Freq: Four times a day (QID) | RECTAL | Status: DC | PRN

## 2023-09-13 MED ORDER — INSULIN ASPART 100 UNIT/ML IJ SOLN
0.0000 [IU] | INTRAMUSCULAR | Status: DC
  Administered 2023-09-13: 2 [IU] via SUBCUTANEOUS
  Administered 2023-09-14 (×2): 1 [IU] via SUBCUTANEOUS
  Administered 2023-09-14: 2 [IU] via SUBCUTANEOUS
  Administered 2023-09-15 (×3): 1 [IU] via SUBCUTANEOUS
  Administered 2023-09-15 – 2023-09-16 (×3): 2 [IU] via SUBCUTANEOUS
  Administered 2023-09-16: 1 [IU] via SUBCUTANEOUS
  Administered 2023-09-16: 2 [IU] via SUBCUTANEOUS
  Administered 2023-09-17 (×2): 1 [IU] via SUBCUTANEOUS
  Administered 2023-09-17: 2 [IU] via SUBCUTANEOUS
  Administered 2023-09-17 (×2): 1 [IU] via SUBCUTANEOUS
  Filled 2023-09-13 (×17): qty 1

## 2023-09-13 MED ORDER — ONDANSETRON HCL 4 MG PO TABS
4.0000 mg | ORAL_TABLET | Freq: Four times a day (QID) | ORAL | Status: DC | PRN
  Administered 2023-09-17: 4 mg via ORAL
  Filled 2023-09-13: qty 1

## 2023-09-13 MED ORDER — ALBUTEROL SULFATE (2.5 MG/3ML) 0.083% IN NEBU: 2.5000 mg | INHALATION_SOLUTION | RESPIRATORY_TRACT | Status: DC | PRN

## 2023-09-13 MED ORDER — ENOXAPARIN SODIUM 40 MG/0.4ML IJ SOSY
40.0000 mg | PREFILLED_SYRINGE | INTRAMUSCULAR | Status: DC
  Administered 2023-09-13 – 2023-09-16 (×4): 40 mg via SUBCUTANEOUS
  Filled 2023-09-13 (×4): qty 0.4

## 2023-09-13 MED ORDER — LEVOTHYROXINE SODIUM 50 MCG PO TABS
50.0000 ug | ORAL_TABLET | Freq: Every day | ORAL | Status: DC
  Administered 2023-09-14 – 2023-09-17 (×4): 50 ug via ORAL
  Filled 2023-09-13 (×4): qty 1

## 2023-09-13 MED ORDER — LACTULOSE 10 GM/15ML PO SOLN
20.0000 g | Freq: Once | ORAL | Status: AC
  Administered 2023-09-13: 20 g via ORAL
  Filled 2023-09-13: qty 30

## 2023-09-13 MED ORDER — PANTOPRAZOLE SODIUM 40 MG PO TBEC
40.0000 mg | DELAYED_RELEASE_TABLET | Freq: Every day | ORAL | Status: DC
  Administered 2023-09-14 – 2023-09-17 (×4): 40 mg via ORAL
  Filled 2023-09-13 (×4): qty 1

## 2023-09-13 MED ORDER — TORSEMIDE 20 MG PO TABS
60.0000 mg | ORAL_TABLET | Freq: Every day | ORAL | Status: DC
  Administered 2023-09-14 – 2023-09-17 (×4): 60 mg via ORAL
  Filled 2023-09-13 (×4): qty 3

## 2023-09-13 MED ORDER — LACTULOSE 10 GM/15ML PO SOLN
30.0000 g | Freq: Four times a day (QID) | ORAL | Status: DC
  Administered 2023-09-13 – 2023-09-17 (×13): 30 g via ORAL
  Filled 2023-09-13 (×13): qty 60

## 2023-09-13 MED ORDER — ONDANSETRON HCL 4 MG/2ML IJ SOLN
4.0000 mg | Freq: Four times a day (QID) | INTRAMUSCULAR | Status: DC | PRN
  Administered 2023-09-14 – 2023-09-15 (×2): 4 mg via INTRAVENOUS
  Filled 2023-09-13 (×2): qty 2

## 2023-09-13 MED ORDER — LACTULOSE 10 GM/15ML PO SOLN: 30.0000 g | Freq: Three times a day (TID) | ORAL | Status: DC

## 2023-09-13 NOTE — ED Notes (Signed)
 Pt to CT

## 2023-09-13 NOTE — H&P (Incomplete)
 History and Physical    Patient: Annette Foster FMW:[id number] DOB: 02-03-1941 DOA: 09/13/2023 DOS: the patient was seen and examined on 09/13/2023 PCP: Alla Amis, MD  Patient coming from: Home  Chief Complaint:  Chief Complaint  Patient presents with  . Altered Mental Status    HPI: Annette Foster is a 83 y.o. 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  Workup otherwise unrevealing for other etiology to her altered mental status. She reportedly developed some confusion the night prior, though at baseline she is alert and oriented x 4.  During the day today her confusion worsened thus prompting a call to 911.  She had no vomiting, nausea or change in bowel habits and has been having her usual 3 BMs daily with her lactulose .  No complaints of abdominal pain, chest pain, shortness of breath, fever or chills.  No cough or cold symptoms.  No dysuria. In the ED, vitals are normal.  Labs at baseline except for ammonia of 82.  She does have baseline anemia of 11.9 and baseline thrombocytopenia of 112 which is stable.  Mild hypokalemia of 3.0.  Urinalysis unremarkable. EKG showing ventricular paced rhythm at 60 CT head nonacute CT abdomen and pelvis also nonacute.  Showing changes of cirrhosis, portal  hypertension and TIPS shunt in place.  Some airspace opacities but improved from prior. Treated in the ED with lactulose  solution and given oral potassium repletion Admission requested     Past Medical History:  Diagnosis Date  . Abdominal pain   . Allergy   . Cancer [facility]) 2017   breast cancer- Left  . Cataract   . CHF (congestive heart failure) (HCC)   . Collagenous colitis   . Coronary artery disease   . Diabetes mellitus without complication (HCC)   . Diarrhea   . Diverticulosis   . Dysrhythmia   . Fatty liver disease, nonalcoholic   . Fibrocystic breast   . GERD (gastroesophageal reflux disease)   . Heart murmur   . Hyperlipidemia   . Hypertension   . Hypothyroidism   . IBS (irritable bowel syndrome)   . IDA (iron  deficiency anemia)   . Liver cirrhosis (HCC)   . Personal history of radiation therapy 2017   LEFT BREAST CA  . Presence of permanent cardiac pacemaker   . Sleep apnea    C-Pap   Past Surgical History:  Procedure Laterality Date  . ABDOMINAL HYSTERECTOMY     [place]  . ABDOMINAL SURGERY    . APPENDECTOMY    . AV NODE ABLATION N/A 05/17/2021   Procedure: AV NODE ABLATION;  Surgeon: Cindie Ole DASEN, MD;  Location: [facility] INVASIVE CV LAB;  Service: Cardiovascular;  Laterality: N/A;  . BREAST BIOPSY Bilateral 2016   negative  . 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 BREAST BIOPSY Left 05/27/2016   BENIGN MAMMARY EPITHELIUM  . BREAST BIOPSY Left 11/20/2017  affirm bx x clip BENIGN MAMMARY EPITHELIUM CONSISTENT WITH RAD THERAPY  . BREAST EXCISIONAL BIOPSY Right    NEG 1980's  . BREAST LUMPECTOMY Left 10/17/2015   DCIS and papillary carcinoma insitu, clear margins  . CARDIAC SURGERY     has replacement valve  . CATARACT EXTRACTION Right   . CATARACT EXTRACTION W/PHACO Left 01/16/2021   Procedure: CATARACT EXTRACTION PHACO AND INTRAOCULAR LENS PLACEMENT (IOC) LEFT DIABETIC 8.46 00:59.8;  Surgeon: Jaye Fallow, MD;   Location: [facility] SURGERY CNTR;  Service: Ophthalmology;  Laterality: Left;  . CHOLECYSTECTOMY    . COLONOSCOPY WITH PROPOFOL  N/A 03/11/2016   Procedure: COLONOSCOPY WITH PROPOFOL ;  Surgeon: Lamar ONEIDA Holmes, MD;  Location: [facility] ENDOSCOPY;  Service: Endoscopy;  Laterality: N/A;  . COLONOSCOPY WITH PROPOFOL  N/A 02/18/2020   Procedure: COLONOSCOPY WITH PROPOFOL ;  Surgeon: Jinny Carmine, MD;  Location: [facility] ENDOSCOPY;  Service: Endoscopy;  Laterality: N/A;  . COLONOSCOPY WITH PROPOFOL  N/A 09/10/2022   Procedure: COLONOSCOPY WITH PROPOFOL ;  Surgeon: Therisa Bi, MD;  Location: [facility] ENDOSCOPY;  Service: Gastroenterology;  Laterality: N/A;  . ESOPHAGEAL BANDING  09/10/2022   Procedure: ESOPHAGEAL BANDING;  Surgeon: Therisa Bi, MD;  Location: [facility] ENDOSCOPY;  Service: Gastroenterology;;  . ESOPHAGEAL BANDING  10/17/2022   Procedure: ESOPHAGEAL BANDING;  Surgeon: Jinny Carmine, MD;  Location: [facility] ENDOSCOPY;  Service: Endoscopy;;  . ESOPHAGEAL BANDING  12/24/2022   Procedure: ESOPHAGEAL BANDING;  Surgeon: Jinny Carmine, MD;  Location: [facility] ENDOSCOPY;  Service: Endoscopy;;  . ESOPHAGEAL BANDING  05/13/2023   Procedure: ESOPHAGOSCOPY, WITH ESOPHAGEAL VARICES BAND LIGATION;  Surgeon: Jinny Carmine, MD;  Location: [facility] ENDOSCOPY;  Service: Endoscopy;;  . ESOPHAGOGASTRODUODENOSCOPY N/A 05/13/2023   Procedure: EGD (ESOPHAGOGASTRODUODENOSCOPY);  Surgeon: Jinny Carmine, MD;  Location: [facility] ENDOSCOPY;  Service: Endoscopy;  Laterality: N/A;  . ESOPHAGOGASTRODUODENOSCOPY (EGD) WITH PROPOFOL  N/A 03/11/2016   Procedure: ESOPHAGOGASTRODUODENOSCOPY (EGD) WITH PROPOFOL ;  Surgeon: Lamar ONEIDA Holmes, MD;  Location: [facility] ENDOSCOPY;  Service: Endoscopy;  Laterality: N/A;  . ESOPHAGOGASTRODUODENOSCOPY (EGD) WITH PROPOFOL  N/A 02/18/2020   Procedure: ESOPHAGOGASTRODUODENOSCOPY (EGD) WITH PROPOFOL ;  Surgeon: Jinny Carmine, MD;  Location: [facility] ENDOSCOPY;  Service: Endoscopy;  Laterality: N/A;  . ESOPHAGOGASTRODUODENOSCOPY (EGD) WITH PROPOFOL   N/A 04/25/2020   Procedure: ESOPHAGOGASTRODUODENOSCOPY (EGD) WITH PROPOFOL ;  Surgeon: Jinny Carmine, MD;  Location: [facility] ENDOSCOPY;  Service: Endoscopy;  Laterality: N/A;  . ESOPHAGOGASTRODUODENOSCOPY (EGD) WITH PROPOFOL  N/A 05/23/2020   Procedure: ESOPHAGOGASTRODUODENOSCOPY (EGD) WITH PROPOFOL ;  Surgeon: Jinny Carmine, MD;  Location: [facility] ENDOSCOPY;  Service: Endoscopy;  Laterality: N/A;  . ESOPHAGOGASTRODUODENOSCOPY (EGD) WITH PROPOFOL  N/A 09/10/2022   Procedure: ESOPHAGOGASTRODUODENOSCOPY (EGD) WITH PROPOFOL ;  Surgeon: Therisa Bi, MD;  Location: [facility] ENDOSCOPY;  Service: Gastroenterology;  Laterality: N/A;  . ESOPHAGOGASTRODUODENOSCOPY (EGD) WITH PROPOFOL  N/A 10/17/2022   Procedure: ESOPHAGOGASTRODUODENOSCOPY (EGD) WITH PROPOFOL ;  Surgeon: Jinny Carmine, MD;  Location: [facility] ENDOSCOPY;  Service: Endoscopy;  Laterality: N/A;  . ESOPHAGOGASTRODUODENOSCOPY (EGD) WITH PROPOFOL  N/A 12/24/2022   Procedure: ESOPHAGOGASTRODUODENOSCOPY (EGD) WITH PROPOFOL ;  Surgeon: Jinny Carmine, MD;  Location: [facility] ENDOSCOPY;  Service: Endoscopy;  Laterality: N/A;  . ESOPHAGOGASTRODUODENOSCOPY (EGD) WITH PROPOFOL  N/A 01/29/2023   Procedure: ESOPHAGOGASTRODUODENOSCOPY (EGD) WITH PROPOFOL ;  Surgeon: Maryruth Ole ONEIDA, MD;  Location: [facility] ENDOSCOPY;  Service: Endoscopy;  Laterality: N/A;  Ideally we would intubate if possible given concern for varices  . EUS N/A 10/04/2021   Procedure: LOWER ENDOSCOPIC ULTRASOUND (EUS);  Surgeon: Elta Fonda SQUIBB, MD;  Location: [facility] ENDOSCOPY;  Service: Gastroenterology;  Laterality: N/A;  [facility]  . EYE SURGERY    . FINGER ARTHROSCOPY WITH  CARPOMETACARPEL [facility]) ARTHROPLASTY Right 09/03/2018   Procedure: CARPOMETACARPEL [facility]) ARTHROPLASTY RIGHT THUMB;  Surgeon: Kathlynn Sharper, MD;  Location: [facility] ORS;  Service: Orthopedics;  Laterality: Right;  . FLEXIBLE SIGMOIDOSCOPY N/A 09/14/2021   Procedure: FLEXIBLE SIGMOIDOSCOPY;  Surgeon: Jinny Carmine, MD;  Location: [facility] ENDOSCOPY;  Service:  Endoscopy;  Laterality: N/A;  No anesthesia  . GANGLION CYST EXCISION Right 09/03/2018   Procedure: REMOVAL GANGLION OF WRIST;  Surgeon: Kathlynn Sharper, MD;  Location: [facility] ORS;  Service: Orthopedics;  Laterality: Right;  . HARDWARE REMOVAL Right 09/03/2018   Procedure: HARDWARE REMOVAL RIGHT THUMB;  Surgeon: Kathlynn Sharper, MD;  Location: [facility] ORS;  Service: Orthopedics;  Laterality: Right;  staple removed  . HEMOSTASIS CLIP PLACEMENT  09/10/2022   Procedure: HEMOSTASIS CLIP PLACEMENT;  Surgeon: Therisa Bi, MD;  Location: [facility] ENDOSCOPY;  Service: Gastroenterology;;  . LIDDIE CONTROL  09/10/2022   Procedure: HEMOSTASIS CONTROL;  Surgeon: Therisa Bi, MD;  Location: [facility] ENDOSCOPY;  Service: Gastroenterology;;  . LIDDIE CONTROL  01/29/2023   Procedure: HEMOSTASIS CONTROL;  Surgeon: Maryruth Ole DASEN, MD;  Location: [facility] ENDOSCOPY;  Service: Endoscopy;;  . IR PARACENTESIS  06/09/2023  . IR RADIOLOGIST EVAL & MGMT  07/22/2023  . IR TIPS  06/09/2023  . JOINT REPLACEMENT Left    TKR  . left sinusplasty     . MASTECTOMY, PARTIAL Left 10/17/2015   Procedure: MASTECTOMY PARTIAL REVISION;  Surgeon: Larinda Unknown Sharps, MD;  Location: [facility] ORS;  Service: General;  Laterality: Left;  . PACEMAKER IMPLANT N/A 03/23/2021   Procedure: PACEMAKER IMPLANT;  Surgeon: Cindie Ole DASEN, MD;  Location: [facility] INVASIVE CV LAB;  Service: Cardiovascular;  Laterality: N/A;  . PARTIAL MASTECTOMY WITH NEEDLE LOCALIZATION Left 09/29/2015   Procedure: PARTIAL MASTECTOMY WITH NEEDLE LOCALIZATION;  Surgeon: Larinda Unknown Sharps, MD;  Location: [facility] ORS;  Service: General;  Laterality: Left;  . POLYPECTOMY  09/10/2022   Procedure: POLYPECTOMY;  Surgeon: Therisa Bi, MD;  Location: [facility] ENDOSCOPY;  Service: Gastroenterology;;  . RECTAL EXAM UNDER ANESTHESIA N/A 10/05/2021   Procedure: RECTAL EXAM UNDER ANESTHESIA, ASPIRATION OF RECTAL CYST;  Surgeon: Rodolph Romano, MD;  Location: [facility] ORS;  Service: General;   Laterality: N/A;  . TOTAL ABDOMINAL HYSTERECTOMY W/ BILATERAL SALPINGOOPHORECTOMY     Social History:  reports that she has never smoked. She has never used smokeless tobacco. She reports that she does not drink alcohol and does not use drugs.  Allergies  Allergen Reactions  . Codeine Itching, Nausea And Vomiting and Other (See Comments)  . Demeclocycline Rash  . Demerol [Meperidine] Itching and Nausea And Vomiting  . Hydrocodone  Itching and Nausea And Vomiting  . Morphine  Nausea Only  . Other Other (See Comments)  . Oxycodone  Itching and Nausea And Vomiting  . Pentazocine Itching, Nausea And Vomiting and Other (See Comments)  . Tetracyclines & Related Rash  . Coal Tar Extract Other (See Comments)  . Hydrocodone -Acetaminophen  Other (See Comments)  . Salicylic Acid Other (See Comments)  . Tetracycline Hcl Other (See Comments)    Family History  Problem Relation Age of Onset  . Breast cancer Paternal Grandmother   . Colon cancer Father   . Diabetes Sister   . Diabetes Brother   . Heart disease Brother   . Prostate cancer Brother   . Colon cancer Maternal Uncle   . Prostate cancer Brother   . Bladder Cancer Brother   . Leukemia Mother        all  . Ovarian cancer Neg Hx   .  Kidney cancer Neg Hx     Prior to Admission medications   Medication Sig Start Date End Date Taking? Authorizing Provider  Cholecalciferol  (VITAMIN D ) 50 MCG (2000 UT) CAPS Take 2,000 Units by mouth daily.    [provider]  clobetasol  ointment (TEMOVATE ) 0.05 % Apply 1 application topically as needed (vaginal irritation).    [provider]  clotrimazole  (LOTRIMIN ) 1 % cream Apply 1 Application topically 2 (two) times daily as needed.    [provider]  cyanocobalamin (,VITAMIN B-12,) 1000 MCG/ML injection 1,000 mcg every 30 (thirty) days. 04/23/19   [provider]  EPINEPHrine  0.3 mg/0.3 mL IJ SOAJ injection Inject 0.3 mg into the muscle. 05/18/19   [provider]  esomeprazole (NEXIUM) 40 MG capsule Take 40 mg by mouth 2 (two) times daily before a meal.  02/08/16   [provider]  fenofibrate  160 MG tablet Take 160 mg by mouth at bedtime.    [provider]  gabapentin  (NEURONTIN ) 300 MG capsule Take 600 mg by mouth at bedtime. Patient taking differently: Take 300 mg by mouth at bedtime. 09/07/16 10/25/36  Corlis Honor BROCKS, MD  Insulin  Aspart FlexPen (NOVOLOG ) 100 UNIT/ML as directed. INJECT UP TO 168 UNITS DAILY AS DIRECTED 08/21/23   [provider]  insulin  degludec (TRESIBA ) 200 UNIT/ML FlexTouch Pen Inject 50 Units into the skin daily. Patient taking differently: Inject 30 Units into the skin daily. 08/21/23   [provider]  lactulose  (CHRONULAC ) 10 GM/15ML solution Take 45 mLs (30 g total) by mouth every 6 (six) hours. 08/03/23   Lenon Marien CROME, MD  levocetirizine (XYZAL) 5 MG tablet Take 5 mg by mouth every evening.    [provider]  levothyroxine  (SYNTHROID ) 50 MCG tablet Take 50 mcg by mouth. Take 1 tablet (50 mcg total) by mouth once daily 07/21/23   [provider]  magnesium  oxide (MAG-OX) 400 MG tablet Take 400 mg by mouth 2 (two) times daily.     [provider]  metolazone  (ZAROXOLYN ) 2.5 MG tablet Take 1 tablet (2.5 mg total) by mouth 2 (two) times a week. On Monday and Friday 09/11/23 12/10/23  Furth, Cadence H, PA-C  potassium chloride  (KLOR-CON ) 20 MEQ packet Take 60 mEq by mouth daily. 1 packet with food Orally Once a day; Duration: 30 day(s) 09/05/23   Donette Ellouise LABOR, FNP  rifaximin  (XIFAXAN ) 550 MG TABS tablet Take 550 mg by mouth. take 1 tablet (550 mg total) by mouth 2 (two) times daily for 120 days 07/23/23 11/20/23  [provider]  simvastatin  (ZOCOR ) 20 MG tablet Take 20 mg by mouth at bedtime.    [provider]  spironolactone  (ALDACTONE ) 50 MG tablet Take 1 tablet (50 mg total) by mouth daily. 08/03/23   Lenon Marien CROME, MD   tirzepatide  (MOUNJARO ) 15 MG/0.5ML Pen Inject 15 mg into the skin once a week. On Fridays    [provider]  torsemide  (DEMADEX ) 20 MG tablet Take 3 tablets (60 mg total) by mouth daily. 06/26/23   End, Lonni, MD  traMADol  (ULTRAM ) 50 MG tablet Take 1 tablet (50 mg total) by mouth every 8 (eight) hours as needed. 06/04/23   Brahmanday, Govinda R, MD  valACYclovir  (VALTREX ) 500 MG tablet Take 500 mg by mouth 2 (two) times daily as needed. 02/10/23   [provider]    Physical Exam: Vitals:   09/13/23 1715 09/13/23 1716 09/13/23 1716 09/13/23 1717  BP:   (!) 142/54  Pulse:  (!) 59    Resp:    12  Temp: 97.9 F (36.6 C)     TempSrc: Oral     SpO2:  99%    Weight:      Height:       Physical Exam  Labs on Admission: I have personally reviewed following labs and imaging studies  CBC: Recent Labs  Lab 09/13/23 1715  WBC 4.8  NEUTROABS 2.0  HGB 11.9*  HCT 33.2*  MCV 90.0  PLT 112*   Basic Metabolic Panel: Recent Labs  Lab 09/13/23 1715  NA 136  K 3.0*  CL 97*  CO2 25  GLUCOSE 105*  BUN 21  CREATININE 0.88  CALCIUM 8.9  MG 2.4   GFR: Estimated Creatinine Clearance: 40.8 mL/min (by C-G formula based on SCr of 0.88 mg/dL). Liver Function Tests: Recent Labs  Lab 09/13/23 1715  AST 49*  ALT 17  ALKPHOS 66  BILITOT 2.6*  PROT 6.9  ALBUMIN  2.8*   Recent Labs  Lab 09/13/23 1715  LIPASE 37   Recent Labs  Lab 09/13/23 1715  AMMONIA 82*   Coagulation Profile: No results for input(s): INR, PROTIME in the last 168 hours. Cardiac Enzymes: No results for input(s): CKTOTAL, CKMB, CKMBINDEX, TROPONINI in the last 168 hours. BNP (last 3 results) No results for input(s): PROBNP in the last 8760 hours. HbA1C: No results for input(s): HGBA1C in the last 72 hours. CBG: No results for input(s): GLUCAP in the last 168 hours. Lipid Profile: No results for input(s): CHOL, HDL, LDLCALC, TRIG, CHOLHDL, LDLDIRECT  in the last 72 hours. Thyroid  Function Tests: No results for input(s): TSH, T4TOTAL, FREET4, T3FREE, THYROIDAB in the last 72 hours. Anemia Panel: No results for input(s): VITAMINB12, FOLATE, FERRITIN, TIBC, IRON , RETICCTPCT in the last 72 hours. Urine analysis:    Component Value Date/Time   COLORURINE YELLOW (A) 09/13/2023 1715   APPEARANCEUR CLEAR (A) 09/13/2023 1715   APPEARANCEUR Cloudy (A) 07/08/2017 1103   LABSPEC 1.011 09/13/2023 1715   PHURINE 7.0 09/13/2023 1715   GLUCOSEU NEGATIVE 09/13/2023 1715   HGBUR NEGATIVE 09/13/2023 1715   BILIRUBINUR NEGATIVE 09/13/2023 1715   BILIRUBINUR neg 10/10/2020 1112   BILIRUBINUR Negative 07/08/2017 1103   KETONESUR NEGATIVE 09/13/2023 1715   PROTEINUR NEGATIVE 09/13/2023 1715   UROBILINOGEN 0.2 10/10/2020 1112   NITRITE NEGATIVE 09/13/2023 1715   LEUKOCYTESUR NEGATIVE 09/13/2023 1715    Radiological Exams on Admission: CT Head Wo Contrast Result Date: 09/13/2023 CLINICAL DATA:  Altered level of consciousness EXAM: CT HEAD WITHOUT CONTRAST TECHNIQUE: Contiguous axial images were obtained from the base of the skull through the vertex without intravenous contrast. RADIATION DOSE REDUCTION: This exam was performed according to the departmental dose-optimization program which includes automated exposure control, adjustment of the mA and/or kV according to patient size and/or use of iterative reconstruction technique. COMPARISON:  08/27/2023 FINDINGS: Brain: No acute infarct or hemorrhage. Lateral ventricles and midline structures are unremarkable. No acute extra-axial fluid collections. No mass effect. Vascular: No hyperdense vessel or unexpected calcification. Skull: Normal. Negative for fracture or focal lesion. Sinuses/Orbits: Mucosal thickening within the ethmoid and maxillary sinuses. Other: None. IMPRESSION: 1. No acute intracranial process. Electronically Signed   By: Ozell Daring M.D.   On: 09/13/2023 19:00   CT  ABDOMEN PELVIS W CONTRAST Result Date: 09/13/2023 CLINICAL DATA:  Abdominal pain, acute, nonlocalized. EXAM: CT ABDOMEN AND PELVIS WITH CONTRAST TECHNIQUE: Multidetector CT imaging of the abdomen and pelvis was performed using the standard protocol  following bolus administration of intravenous contrast. RADIATION DOSE REDUCTION: This exam was performed according to the departmental dose-optimization program which includes automated exposure control, adjustment of the mA and/or kV according to patient size and/or use of iterative reconstruction technique. CONTRAST:  OMNIPAQUE  IOHEXOL  300 MG/ML  SOLN COMPARISON:  08/01/2023. FINDINGS: Lower chest: The heart is enlarged and there is a trace pericardial effusion. Pacemaker leads are noted in the heart. There is a trace right pleural effusion. Interstitial prominence and a few airspace opacities are noted at the lung bases, improved from the previous exam. Hepatobiliary: The liver has a nodular contour, compatible with underlying cirrhosis. No focal abnormality is seen in the liver. The gallbladder is surgically absent. No biliary ductal dilatation. A tips shunt is noted. Pancreas: Unremarkable. No pancreatic ductal dilatation or surrounding inflammatory changes. Spleen: The spleen is enlarged.  No focal abnormality is seen. Adrenals/Urinary Tract: The adrenal glands are within normal limits. The kidneys enhance symmetrically. No renal calculus or hydronephrosis bilaterally. The bladder is unremarkable. Stomach/Bowel: The stomach is within normal limits. No bowel obstruction, free air, or pneumatosis is seen. Appendix is not seen. Vascular/Lymphatic: Aortic atherosclerosis. No enlarged abdominal or pelvic lymph nodes. Reproductive: Status post hysterectomy. No adnexal masses. Other: No abdominopelvic ascites. Musculoskeletal: Degenerative changes are present in the thoracolumbar spine. No acute osseous abnormality. IMPRESSION: 1. No acute intra-abdominal process.  2. Morphologic changes of cirrhosis and portal hypertension. TIP shunt in place. 3. Interstitial prominence with a few scattered airspace opacities at the lung bases, improved from the prior exam. 4. Aortic atherosclerosis. Electronically Signed   By: Leita Birmingham M.D.   On: 09/13/2023 18:57   DG Chest Portable 1 View Result Date: 09/13/2023 EXAM: 1 VIEW XRAY OF THE CHEST 09/13/2023 05:19:53 PM COMPARISON: 08/27/2023 CLINICAL HISTORY: Weakness, AMS. Arrives via EMS from home d/t disorientation; Daughter reports minor confusion noted last night FINDINGS: LUNGS AND PLEURA: The coarse interstitial markings predominantly in the left lower lung are stable. No new infiltrate or overt edema. No pleural effusion. No pneumothorax. HEART AND MEDIASTINUM: No acute abnormality of the cardiac and mediastinal silhouettes. Atheromatous thoracic aorta. Stable right subclavian transvenous pacemaker. BONES AND SOFT TISSUES: No acute osseous abnormality. IMPRESSION: 1. No acute findings. 2. Stable coarse interstitial markings predominantly in the left lower lung. Electronically signed by: Katheleen Faes MD 09/13/2023 05:25 PM EDT RP Workstation: [id number]   Data Reviewed for HPI: Relevant notes from primary care and specialist visits, past discharge summaries as available in EHR, including Care Everywhere. Prior diagnostic testing as pertinent to current admission diagnoses Updated medications and problem lists for reconciliation ED course, including vitals, labs, imaging, treatment and response to treatment Triage notes, nursing and pharmacy notes and ED provider's notes Notable results as noted above in HPI      Assessment and Plan:  Hepatic encephalopathy NASH cirrhosis s/p TIPS 06/09/2023  CT abdomen nonacute Neurologic checks with fall and aspiration precautions Hold sedating meds of Neurontin  and tramadol  Continue lactulose  45 g 4 times daily Resume nadolol , spironolactone , torsemide     Hypokalemia Repleted in the ED Monitor and correct as needed  Pancytopenia (HCC):  -stable   Hypothyroidism -Synthroid    Chronic hypotension  - Continue midodrine  -Cautiously continue nadolol , spironolactone , torsemide  with hold parameters   Chronic heart failure with preserved ejection fraction (HFpEF) (HCC):  - Clinically dry -2D echo on 10/07/2022 showed EF of 55 to 60% with grade 2      HLD (hyperlipidemia) - Fenofibrate  and zocor    Diabetes  mellitus without complication (HCC):  -Sliding scale insulin  - Glargine insulin    OSA - on CPAP   DVT prophylaxis: lovenox   Consults: none  Advance Care Planning:   Code Status: Full Code   Family Communication: none***  Disposition Plan: Back to previous home environment  Severity of Illness: The appropriate patient status for this patient is OBSERVATION. Observation status is judged to be reasonable and necessary in order to provide the required intensity of service to ensure the patient's safety. The patient's presenting symptoms, physical exam findings, and initial radiographic and laboratory data in the context of their medical condition is felt to place them at decreased risk for further clinical deterioration. Furthermore, it is anticipated that the patient will be medically stable for discharge from the hospital within 2 midnights of admission.   Author: Delayne LULLA Solian, MD 09/13/2023 8:39 PM  For on call review [email or web address].

## 2023-09-13 NOTE — ED Notes (Signed)
 Fall precautions in place for Pt. This RN placed fall band, fall grip socks, bed alarm and fall sign.

## 2023-09-13 NOTE — Hospital Course (Signed)
 SABRA

## 2023-09-13 NOTE — ED Provider Notes (Signed)
 [facility] Provider Note    Event Date/Time   First MD Initiated Contact with Patient 09/13/23 1708     (approximate)   History   Chief Complaint Altered Mental Status   HPI  Annette Foster is a 83 y.o. female with past medical history of diabetes, nonalcoholic fatty liver disease, cirrhosis, and diastolic CHF who presents to the ED for altered mental status.  Patient's daughter reported to EMS that patient started dealing with some mild confusion last night when she is typically alert and oriented x 4.  Confusion worsened throughout the day today, prompting daughter to contact EMS.  On arrival, patient denies any complaints and specifically denies any cough, chest pain, shortness of breath, nausea, vomiting, diarrhea, or dysuria.     Physical Exam   Triage Vital Signs: ED Triage Vitals  Encounter Vitals Group     BP      Girls Systolic BP Percentile      Girls Diastolic BP Percentile      Boys Systolic BP Percentile      Boys Diastolic BP Percentile      Pulse      Resp      Temp      Temp src      SpO2      Weight      Height      Head Circumference      Peak Flow      Pain Score      Pain Loc      Pain Education      Exclude from Growth Chart     Most recent vital signs: Vitals:   09/13/23 1716 09/13/23 1717  BP: (!) 142/54   Pulse:    Resp:  12  Temp:    SpO2:      Constitutional: Alert and oriented to person and place, but not time or situation. Eyes: Conjunctivae are normal. Head: Atraumatic. Nose: No congestion/rhinnorhea. Mouth/Throat: Mucous membranes are moist.  Cardiovascular: Normal rate, regular rhythm. Grossly normal heart sounds.  2+ radial pulses bilaterally. Respiratory: Normal respiratory effort.  No retractions. Lungs CTAB. Gastrointestinal: Soft and diffusely tender to palpation with no rebound or guarding. No distention. Musculoskeletal: No lower extremity tenderness nor edema.  Neurologic:  Normal  speech and language. No gross focal neurologic deficits are appreciated.  Asterixis noted.    ED Results / Procedures / Treatments   Labs (all labs ordered are listed, but only abnormal results are displayed) Labs Reviewed  CBC WITH DIFFERENTIAL/PLATELET - Abnormal; Notable for the following components:      Result Value   RBC 3.69 (*)    Hemoglobin 11.9 (*)    HCT 33.2 (*)    Platelets 112 (*)    All other components within normal limits  COMPREHENSIVE METABOLIC PANEL WITH GFR - Abnormal; Notable for the following components:   Potassium 3.0 (*)    Chloride 97 (*)    Glucose, Bld 105 (*)    Albumin  2.8 (*)    AST 49 (*)    Total Bilirubin 2.6 (*)    All other components within normal limits  URINALYSIS, ROUTINE W REFLEX MICROSCOPIC - Abnormal; Notable for the following components:   Color, Urine YELLOW (*)    APPearance CLEAR (*)    Bacteria, UA RARE (*)    All other components within normal limits  AMMONIA - Abnormal; Notable for the following components:   Ammonia 82 (*)    All other  components within normal limits  LIPASE, BLOOD  MAGNESIUM      EKG  ED ECG REPORT I, Carlin Palin, the attending physician, personally viewed and interpreted this ECG.   Date: 09/13/2023  EKG Time: 17:21  Rate: 60  Rhythm: Ventricular paced rhythm  Axis: Normal  Intervals:none  ST&T Change: None  RADIOLOGY CT head reviewed and interpreted by me with no hemorrhage or midline shift.  PROCEDURES:  Critical Care performed: No  Procedures   MEDICATIONS ORDERED IN ED: Medications  iohexol  (OMNIPAQUE ) 300 MG/ML solution 100 mL (100 mLs Intravenous Contrast Given 09/13/23 1829)  lactulose  (CHRONULAC ) 10 GM/15ML solution 20 g (20 g Oral Given 09/13/23 1924)  potassium chloride  (KLOR-CON ) packet 40 mEq (40 mEq Oral Given 09/13/23 1925)     IMPRESSION / MDM / ASSESSMENT AND PLAN / ED COURSE  I reviewed the triage vital signs and the nursing notes.                               84 y.o. female with past medical history of diabetes, nonalcoholic fatty liver disease, cirrhosis, diastolic CHF who presents to the ED for worsening altered mental status since last night.  Patient's presentation is most consistent with acute presentation with potential threat to life or bodily function.  Differential diagnosis includes, but is not limited to, stroke, TIA, hepatic encephalopathy, anemia, electrolyte abnormality, AKI, sepsis, UTI, pneumonia.  Patient nontoxic-appearing and in no acute distress, vital signs are unremarkable.  She does appear disoriented from her baseline, but seems to have a nonfocal neurologic exam.  She does have some asterixis concerning for [facility] encephalopathy, unclear when her last bowel movement was.  We will check CT head, chest x-ray, labs, and urinalysis.  CT head is negative for acute process, labs without significant anemia, leukocytosis, electrolyte abnormality, or AKI.  Labs with similar elevation in bilirubin and patient does have significantly elevated ammonia at 82 which is likely the source of her altered mental status.  CT imaging of her abdomen/pelvis is negative for acute process, urinalysis without signs of infection.  Patient given dose of lactulose  and case discussed with hospitalist for admission.      FINAL CLINICAL IMPRESSION(S) / ED DIAGNOSES   Final diagnoses:  Hepatic encephalopathy (HCC)  Altered mental status, unspecified altered mental status type     Rx / DC Orders   ED Discharge Orders     None        Note:  This document was prepared using Dragon voice recognition software and may include unintentional dictation errors.   Palin Carlin, MD 09/13/23 [phone]

## 2023-09-13 NOTE — H&P (Signed)
 History and Physical    Patient: Annette Foster FMW:[id number] DOB: 06-May-1940 DOA: 09/13/2023 DOS: the patient was seen and examined on 09/13/2023 PCP: Alla Amis, MD  Patient coming from: Home  Chief Complaint:  Chief Complaint  Patient presents with   Altered Mental Status    HPI: Annette Foster is a 83 y.o. 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  Workup otherwise unrevealing for other etiology to her altered mental status. She reportedly developed some confusion the night prior, though at baseline she is alert and oriented x 4.  During the day today her confusion worsened thus prompting a call to 911.  She had no vomiting, nausea or change in bowel habits and has been having her usual 3 BMs daily with her lactulose .  No complaints of abdominal pain, chest pain, shortness of breath, fever or chills.  No cough or cold symptoms.  No dysuria. In the ED, vitals are normal.  Labs at baseline except for ammonia of 82.  She does have baseline anemia of 11.9 and baseline thrombocytopenia of 112 which is stable.  Mild hypokalemia of 3.0.  Urinalysis unremarkable. EKG showing ventricular paced rhythm at 60 CT head nonacute CT abdomen and pelvis also nonacute.  Showing changes of cirrhosis, portal  hypertension and TIPS shunt in place.  Some airspace opacities but improved from prior. Treated in the ED with lactulose  solution and given oral potassium repletion Admission requested     Past Medical History:  Diagnosis Date   Abdominal pain    Allergy    Cancer (HCC) 2017   breast cancer- Left   Cataract    CHF (congestive heart failure) (HCC)    Collagenous colitis    Coronary artery disease    Diabetes mellitus without complication (HCC)    Diarrhea    Diverticulosis    Dysrhythmia    Fatty liver disease, nonalcoholic    Fibrocystic breast    GERD (gastroesophageal reflux disease)    Heart murmur    Hyperlipidemia    Hypertension    Hypothyroidism    IBS (irritable bowel syndrome)    IDA (iron  deficiency anemia)    Liver cirrhosis (HCC)    Personal history of radiation therapy 2017   LEFT BREAST CA   Presence of permanent cardiac pacemaker    Sleep apnea    C-Pap   Past Surgical History:  Procedure Laterality Date   ABDOMINAL HYSTERECTOMY     [place] bso   ABDOMINAL SURGERY     APPENDECTOMY     AV NODE ABLATION N/A 05/17/2021   Procedure: AV NODE ABLATION;  Surgeon: Cindie Ole DASEN, MD;  [facility]: [facility];  Service: Cardiovascular;  Laterality: N/A;   BREAST BIOPSY Bilateral 2016   negative   BREAST 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/2015   DCIS, papillary carcinoma in situ   BREAST BIOPSY Left 05/27/2016   BENIGN MAMMARY EPITHELIUM   BREAST BIOPSY Left 11/20/2017  affirm bx x clip BENIGN MAMMARY EPITHELIUM CONSISTENT WITH RAD THERAPY   BREAST EXCISIONAL BIOPSY Right    NEG 1980's   BREAST LUMPECTOMY Left 10/17/2015   DCIS and papillary carcinoma insitu, clear margins   CARDIAC SURGERY     has replacement valve   CATARACT EXTRACTION Right    CATARACT EXTRACTION W/PHACO Left 01/16/2021   Procedure: CATARACT EXTRACTION PHACO AND INTRAOCULAR LENS PLACEMENT (IOC) LEFT DIABETIC 8.46 00:59.8;  Surgeon: Jaye Fallow, MD;  Location: [facility];   Service: Ophthalmology;  Laterality: Left;   CHOLECYSTECTOMY     COLONOSCOPY WITH PROPOFOL  N/A 03/11/2016   Procedure: COLONOSCOPY WITH PROPOFOL ;  Surgeon: Lamar ONEIDA Holmes, MD;  Location: [facility] ENDOSCOPY;  Service: Endoscopy;  Laterality: N/A;   COLONOSCOPY WITH PROPOFOL  N/A 02/18/2020   Procedure: COLONOSCOPY WITH PROPOFOL ;  Surgeon: Jinny Carmine, MD;  Location: [facility] ENDOSCOPY;  Service: Endoscopy;  Laterality: N/A;   COLONOSCOPY WITH PROPOFOL  N/A 09/10/2022   Procedure: COLONOSCOPY WITH PROPOFOL ;  Surgeon: Therisa Bi, MD;  Location: [facility] ENDOSCOPY;  Service: Gastroenterology;  Laterality: N/A;   ESOPHAGEAL BANDING  09/10/2022   Procedure: ESOPHAGEAL BANDING;  Surgeon: Therisa Bi, MD;  Location: [facility] ENDOSCOPY;  Service: Gastroenterology;;   ESOPHAGEAL BANDING  10/17/2022   Procedure: ESOPHAGEAL BANDING;  Surgeon: Jinny Carmine, MD;  Location: [facility] ENDOSCOPY;  Service: Endoscopy;;   ESOPHAGEAL BANDING  12/24/2022   Procedure: ESOPHAGEAL BANDING;  Surgeon: Jinny Carmine, MD;  Location: [facility] ENDOSCOPY;  Service: Endoscopy;;   ESOPHAGEAL BANDING  05/13/2023   Procedure: ESOPHAGOSCOPY, WITH ESOPHAGEAL VARICES BAND LIGATION;  Surgeon: Jinny Carmine, MD;  Location: [facility] ENDOSCOPY;  Service: Endoscopy;;   ESOPHAGOGASTRODUODENOSCOPY N/A 05/13/2023   Procedure: EGD (ESOPHAGOGASTRODUODENOSCOPY);  Surgeon: Jinny Carmine, MD;  Location: [facility] ENDOSCOPY;  Service: Endoscopy;  Laterality: N/A;   ESOPHAGOGASTRODUODENOSCOPY (EGD) WITH PROPOFOL  N/A 03/11/2016   Procedure: ESOPHAGOGASTRODUODENOSCOPY (EGD) WITH PROPOFOL ;  Surgeon: Lamar ONEIDA Holmes, MD;  Location: [facility] ENDOSCOPY;  Service: Endoscopy;  Laterality: N/A;   ESOPHAGOGASTRODUODENOSCOPY (EGD) WITH PROPOFOL  N/A 02/18/2020   Procedure: ESOPHAGOGASTRODUODENOSCOPY (EGD) WITH PROPOFOL ;  Surgeon: Jinny Carmine, MD;  Location: [facility] ENDOSCOPY;  Service: Endoscopy;  Laterality: N/A;   ESOPHAGOGASTRODUODENOSCOPY (EGD) WITH PROPOFOL  N/A 04/25/2020   Procedure:  ESOPHAGOGASTRODUODENOSCOPY (EGD) WITH PROPOFOL ;  Surgeon: Jinny Carmine, MD;  Location: [facility] ENDOSCOPY;  Service: Endoscopy;  Laterality: N/A;   ESOPHAGOGASTRODUODENOSCOPY (EGD) WITH PROPOFOL  N/A 05/23/2020   Procedure: ESOPHAGOGASTRODUODENOSCOPY (EGD) WITH PROPOFOL ;  Surgeon: Jinny Carmine, MD;  Location: [facility] ENDOSCOPY;  Service: Endoscopy;  Laterality: N/A;   ESOPHAGOGASTRODUODENOSCOPY (EGD) WITH PROPOFOL  N/A 09/10/2022   Procedure: ESOPHAGOGASTRODUODENOSCOPY (EGD) WITH PROPOFOL ;  Surgeon: Therisa Bi, MD;  Location: [facility] ENDOSCOPY;  Service: Gastroenterology;  Laterality: N/A;   ESOPHAGOGASTRODUODENOSCOPY (EGD) WITH PROPOFOL  N/A 10/17/2022   Procedure: ESOPHAGOGASTRODUODENOSCOPY (EGD) WITH PROPOFOL ;  Surgeon: Jinny Carmine, MD;  Location: [facility] ENDOSCOPY;  Service: Endoscopy;  Laterality: N/A;   ESOPHAGOGASTRODUODENOSCOPY (EGD) WITH PROPOFOL  N/A 12/24/2022   Procedure: ESOPHAGOGASTRODUODENOSCOPY (EGD) WITH PROPOFOL ;  Surgeon: Jinny Carmine, MD;  Location: [facility] ENDOSCOPY;  Service: Endoscopy;  Laterality: N/A;   ESOPHAGOGASTRODUODENOSCOPY (EGD) WITH PROPOFOL  N/A 01/29/2023   Procedure: ESOPHAGOGASTRODUODENOSCOPY (EGD) WITH PROPOFOL ;  Surgeon: Maryruth Ole ONEIDA, MD;  Location: [facility] ENDOSCOPY;  Service: Endoscopy;  Laterality: N/A;  Ideally we would intubate if possible given concern for varices   EUS N/A 10/04/2021   Procedure: LOWER ENDOSCOPIC ULTRASOUND (EUS);  Surgeon: Elta Fonda SQUIBB, MD;  Location: [facility] ENDOSCOPY;  Service: Gastroenterology;  Laterality: N/A;  [facility]   EYE SURGERY     FINGER ARTHROSCOPY WITH  CARPOMETACARPEL [facility]) ARTHROPLASTY Right 09/03/2018   Procedure: CARPOMETACARPEL [facility]) ARTHROPLASTY RIGHT THUMB;  Surgeon: Kathlynn Sharper, MD;  Location: [facility] ORS;  Service: Orthopedics;  Laterality: Right;   FLEXIBLE SIGMOIDOSCOPY N/A 09/14/2021   Procedure: FLEXIBLE SIGMOIDOSCOPY;  Surgeon: Jinny Carmine, MD;  Location: [facility] ENDOSCOPY;  Service: Endoscopy;  Laterality: N/A;  No anesthesia    GANGLION CYST EXCISION Right 09/03/2018   Procedure: REMOVAL GANGLION OF WRIST;  Surgeon: Kathlynn Sharper, MD;  Location: [facility] ORS;  Service: Orthopedics;  Laterality: Right;   HARDWARE REMOVAL Right 09/03/2018   Procedure: HARDWARE REMOVAL RIGHT THUMB;  Surgeon: Kathlynn Sharper, MD;  Location: [facility] ORS;  Service: Orthopedics;  Laterality: Right;  staple removed   HEMOSTASIS CLIP PLACEMENT  09/10/2022   Procedure: HEMOSTASIS CLIP PLACEMENT;  Surgeon: Therisa Bi, MD;  Location: [facility] ENDOSCOPY;  Service: Gastroenterology;;   HEMOSTASIS CONTROL  09/10/2022   Procedure: HEMOSTASIS CONTROL;  Surgeon: Therisa Bi, MD;  Location: [facility] ENDOSCOPY;  Service: Gastroenterology;;   HEMOSTASIS CONTROL  01/29/2023   Procedure: HEMOSTASIS CONTROL;  Surgeon: Maryruth Ole DASEN, MD;  Location: [facility] ENDOSCOPY;  Service: Endoscopy;;   IR PARACENTESIS  06/09/2023   IR RADIOLOGIST EVAL & MGMT  07/22/2023   IR TIPS  06/09/2023   JOINT REPLACEMENT Left    TKR   left sinusplasty      MASTECTOMY, PARTIAL Left 10/17/2015   Procedure: MASTECTOMY PARTIAL REVISION;  Surgeon: Larinda Unknown Sharps, MD;  Location: [facility] ORS;  Service: General;  Laterality: Left;   PACEMAKER IMPLANT N/A 03/23/2021   Procedure: PACEMAKER IMPLANT;  Surgeon: Cindie Ole DASEN, MD;  Location: [facility];  Service: Cardiovascular;  Laterality: N/A;   PARTIAL MASTECTOMY WITH NEEDLE LOCALIZATION Left 09/29/2015   Procedure: PARTIAL MASTECTOMY WITH NEEDLE LOCALIZATION;  Surgeon: Larinda Unknown Sharps, MD;  Location: [facility] ORS;  Service: General;  Laterality: Left;   POLYPECTOMY  09/10/2022   Procedure: POLYPECTOMY;  Surgeon: Therisa Bi, MD;  Location: [facility] ENDOSCOPY;  Service: Gastroenterology;;   RECTAL EXAM UNDER ANESTHESIA N/A 10/05/2021   Procedure: RECTAL EXAM UNDER ANESTHESIA, ASPIRATION OF RECTAL CYST;  Surgeon: Rodolph Romano, MD;  Location: [facility] ORS;  Service: General;  Laterality: N/A;   TOTAL ABDOMINAL HYSTERECTOMY W/ BILATERAL  SALPINGOOPHORECTOMY     Social History:  reports that she has never smoked. She has never used smokeless tobacco. She reports that she does not drink alcohol and does not use drugs.  Allergies  Allergen Reactions   Codeine Itching, Nausea And Vomiting and Other (See Comments)   Demeclocycline Rash   Demerol [Meperidine] Itching and Nausea And Vomiting   Hydrocodone  Itching and Nausea And Vomiting   Morphine  Nausea Only   Other Other (See Comments)   Oxycodone  Itching and Nausea And Vomiting   Pentazocine Itching, Nausea And Vomiting and Other (See Comments)   Tetracyclines & Related Rash   Coal Tar Extract Other (See Comments)   Hydrocodone -Acetaminophen  Other (See Comments)   Salicylic Acid Other (See Comments)   Tetracycline Hcl Other (See Comments)    Family History  Problem Relation Age of Onset   Breast cancer Paternal Grandmother    Colon cancer Father    Diabetes Sister    Diabetes Brother    Heart disease Brother    Prostate cancer Brother    Colon cancer Maternal Uncle    Prostate cancer Brother    Bladder Cancer Brother    Leukemia Mother        all   Ovarian cancer Neg Hx  Kidney cancer Neg Hx     Prior to Admission medications   Medication Sig Start Date End Date Taking? Authorizing Provider  Cholecalciferol  (VITAMIN D ) 50 MCG (2000 UT) CAPS Take 2,000 Units by mouth daily.    [provider]  clobetasol  ointment (TEMOVATE ) 0.05 % Apply 1 application topically as needed (vaginal irritation).    [provider]  clotrimazole  (LOTRIMIN ) 1 % cream Apply 1 Application topically 2 (two) times daily as needed.    [provider]  cyanocobalamin (,VITAMIN B-12,) 1000 MCG/ML injection 1,000 mcg every 30 (thirty) days. 04/23/19   [provider]  EPINEPHrine  0.3 mg/0.3 mL IJ SOAJ injection Inject 0.3 mg into the muscle. 05/18/19   [provider]  esomeprazole (NEXIUM) 40 MG capsule Take 40 mg by mouth 2 (two) times daily  before a meal.  02/08/16   [provider]  fenofibrate  160 MG tablet Take 160 mg by mouth at bedtime.    [provider]  gabapentin  (NEURONTIN ) 300 MG capsule Take 600 mg by mouth at bedtime. Patient taking differently: Take 300 mg by mouth at bedtime. 09/07/16 10/25/36  Corlis Honor BROCKS, MD  Insulin  Aspart FlexPen (NOVOLOG ) 100 UNIT/ML as directed. INJECT UP TO 168 UNITS DAILY AS DIRECTED 08/21/23   [provider]  insulin  degludec (TRESIBA ) 200 UNIT/ML FlexTouch Pen Inject 50 Units into the skin daily. Patient taking differently: Inject 30 Units into the skin daily. 08/21/23   [provider]  lactulose  (CHRONULAC ) 10 GM/15ML solution Take 45 mLs (30 g total) by mouth every 6 (six) hours. 08/03/23   Lenon Marien CROME, MD  levocetirizine (XYZAL) 5 MG tablet Take 5 mg by mouth every evening.    [provider]  levothyroxine  (SYNTHROID ) 50 MCG tablet Take 50 mcg by mouth. Take 1 tablet (50 mcg total) by mouth once daily 07/21/23   [provider]  magnesium  oxide (MAG-OX) 400 MG tablet Take 400 mg by mouth 2 (two) times daily.     [provider]  metolazone  (ZAROXOLYN ) 2.5 MG tablet Take 1 tablet (2.5 mg total) by mouth 2 (two) times a week. On Monday and Friday 09/11/23 12/10/23  Furth, Cadence H, PA-C  potassium chloride  (KLOR-CON ) 20 MEQ packet Take 60 mEq by mouth daily. 1 packet with food Orally Once a day; Duration: 30 day(s) 09/05/23   Donette Ellouise LABOR, FNP  rifaximin  (XIFAXAN ) 550 MG TABS tablet Take 550 mg by mouth. take 1 tablet (550 mg total) by mouth 2 (two) times daily for 120 days 07/23/23 11/20/23  [provider]  simvastatin  (ZOCOR ) 20 MG tablet Take 20 mg by mouth at bedtime.    [provider]  spironolactone  (ALDACTONE ) 50 MG tablet Take 1 tablet (50 mg total) by mouth daily. 08/03/23   Lenon Marien CROME, MD  tirzepatide  (MOUNJARO ) 15 MG/0.5ML Pen Inject 15 mg into the skin once a week. On Fridays     [provider]  torsemide  (DEMADEX ) 20 MG tablet Take 3 tablets (60 mg total) by mouth daily. 06/26/23   End, Lonni, MD  traMADol  (ULTRAM ) 50 MG tablet Take 1 tablet (50 mg total) by mouth every 8 (eight) hours as needed. 06/04/23   Brahmanday, Govinda R, MD  valACYclovir  (VALTREX ) 500 MG tablet Take 500 mg by mouth 2 (two) times daily as needed. 02/10/23   [provider]    Physical Exam: Vitals:   09/13/23 1715 09/13/23 1716 09/13/23 1716 09/13/23 1717  BP:   (!) 142/54  Pulse:  (!) 59    Resp:    12  Temp: 97.9 F (36.6 C)     TempSrc: Oral     SpO2:  99%    Weight:      Height:       Physical Exam Vitals and nursing note reviewed.  Constitutional:      General: She is not in acute distress.    Comments: Frail-appearing female  HENT:     Head: Normocephalic and atraumatic.  Cardiovascular:     Rate and Rhythm: Regular rhythm. Bradycardia present.     Heart sounds: Normal heart sounds.  Pulmonary:     Effort: Pulmonary effort is normal.     Breath sounds: Normal breath sounds.  Abdominal:     Palpations: Abdomen is soft.     Tenderness: There is no abdominal tenderness.  Neurological:     General: No focal deficit present.     Mental Status: She is disoriented.     Labs on Admission: I have personally reviewed following labs and imaging studies  CBC: Recent Labs  Lab 09/13/23 1715  WBC 4.8  NEUTROABS 2.0  HGB 11.9*  HCT 33.2*  MCV 90.0  PLT 112*   Basic Metabolic Panel: Recent Labs  Lab 09/13/23 1715  NA 136  K 3.0*  CL 97*  CO2 25  GLUCOSE 105*  BUN 21  CREATININE 0.88  CALCIUM 8.9  MG 2.4   GFR: Estimated Creatinine Clearance: 40.8 mL/min (by C-G formula based on SCr of 0.88 mg/dL). Liver Function Tests: Recent Labs  Lab 09/13/23 1715  AST 49*  ALT 17  ALKPHOS 66  BILITOT 2.6*  PROT 6.9  ALBUMIN  2.8*   Recent Labs  Lab 09/13/23 1715  LIPASE 37   Recent Labs  Lab 09/13/23 1715  AMMONIA 82*    Coagulation Profile: No results for input(s): INR, PROTIME in the last 168 hours. Cardiac Enzymes: No results for input(s): CKTOTAL, CKMB, CKMBINDEX, TROPONINI in the last 168 hours. BNP (last 3 results) No results for input(s): PROBNP in the last 8760 hours. HbA1C: No results for input(s): HGBA1C in the last 72 hours. CBG: No results for input(s): GLUCAP in the last 168 hours. Lipid Profile: No results for input(s): CHOL, HDL, LDLCALC, TRIG, CHOLHDL, LDLDIRECT in the last 72 hours. Thyroid  Function Tests: No results for input(s): TSH, T4TOTAL, FREET4, T3FREE, THYROIDAB in the last 72 hours. Anemia Panel: No results for input(s): VITAMINB12, FOLATE, FERRITIN, TIBC, IRON , RETICCTPCT in the last 72 hours. Urine analysis:    Component Value Date/Time   COLORURINE YELLOW (A) 09/13/2023 1715   APPEARANCEUR CLEAR (A) 09/13/2023 1715   APPEARANCEUR Cloudy (A) 07/08/2017 1103   LABSPEC 1.011 09/13/2023 1715   PHURINE 7.0 09/13/2023 1715   GLUCOSEU NEGATIVE 09/13/2023 1715   HGBUR NEGATIVE 09/13/2023 1715   BILIRUBINUR NEGATIVE 09/13/2023 1715   BILIRUBINUR neg 10/10/2020 1112   BILIRUBINUR Negative 07/08/2017 1103   KETONESUR NEGATIVE 09/13/2023 1715   PROTEINUR NEGATIVE 09/13/2023 1715   UROBILINOGEN 0.2 10/10/2020 1112   NITRITE NEGATIVE 09/13/2023 1715   LEUKOCYTESUR NEGATIVE 09/13/2023 1715    Radiological Exams on Admission: CT Head Wo Contrast Result Date: 09/13/2023 CLINICAL DATA:  Altered level of consciousness EXAM: CT HEAD WITHOUT CONTRAST TECHNIQUE: Contiguous axial images were obtained from the base of the skull through the vertex without intravenous contrast. RADIATION DOSE REDUCTION: This exam was performed according to the departmental dose-optimization program which includes automated exposure control, adjustment of the mA and/or kV according  to patient size and/or use of iterative reconstruction technique.  COMPARISON:  08/27/2023 FINDINGS: Brain: No acute infarct or hemorrhage. Lateral ventricles and midline structures are unremarkable. No acute extra-axial fluid collections. No mass effect. Vascular: No hyperdense vessel or unexpected calcification. Skull: Normal. Negative for fracture or focal lesion. Sinuses/Orbits: Mucosal thickening within the ethmoid and maxillary sinuses. Other: None. IMPRESSION: 1. No acute intracranial process. Electronically Signed   By: Ozell Daring M.D.   On: 09/13/2023 19:00   CT ABDOMEN PELVIS W CONTRAST Result Date: 09/13/2023 CLINICAL DATA:  Abdominal pain, acute, nonlocalized. EXAM: CT ABDOMEN AND PELVIS WITH CONTRAST TECHNIQUE: Multidetector CT imaging of the abdomen and pelvis was performed using the standard protocol following bolus administration of intravenous contrast. RADIATION DOSE REDUCTION: This exam was performed according to the departmental dose-optimization program which includes automated exposure control, adjustment of the mA and/or kV according to patient size and/or use of iterative reconstruction technique. CONTRAST:  OMNIPAQUE  IOHEXOL  300 MG/ML  SOLN COMPARISON:  08/01/2023. FINDINGS: Lower chest: The heart is enlarged and there is a trace pericardial effusion. Pacemaker leads are noted in the heart. There is a trace right pleural effusion. Interstitial prominence and a few airspace opacities are noted at the lung bases, improved from the previous exam. Hepatobiliary: The liver has a nodular contour, compatible with underlying cirrhosis. No focal abnormality is seen in the liver. The gallbladder is surgically absent. No biliary ductal dilatation. A tips shunt is noted. Pancreas: Unremarkable. No pancreatic ductal dilatation or surrounding inflammatory changes. Spleen: The spleen is enlarged.  No focal abnormality is seen. Adrenals/Urinary Tract: The adrenal glands are within normal limits. The kidneys enhance symmetrically. No renal calculus or  hydronephrosis bilaterally. The bladder is unremarkable. Stomach/Bowel: The stomach is within normal limits. No bowel obstruction, free air, or pneumatosis is seen. Appendix is not seen. Vascular/Lymphatic: Aortic atherosclerosis. No enlarged abdominal or pelvic lymph nodes. Reproductive: Status post hysterectomy. No adnexal masses. Other: No abdominopelvic ascites. Musculoskeletal: Degenerative changes are present in the thoracolumbar spine. No acute osseous abnormality. IMPRESSION: 1. No acute intra-abdominal process. 2. Morphologic changes of cirrhosis and portal hypertension. TIP shunt in place. 3. Interstitial prominence with a few scattered airspace opacities at the lung bases, improved from the prior exam. 4. Aortic atherosclerosis. Electronically Signed   By: Leita Birmingham M.D.   On: 09/13/2023 18:57   DG Chest Portable 1 View Result Date: 09/13/2023 EXAM: 1 VIEW XRAY OF THE CHEST 09/13/2023 05:19:53 PM COMPARISON: 08/27/2023 CLINICAL HISTORY: Weakness, AMS. Arrives via EMS from home d/t disorientation; Daughter reports minor confusion noted last night FINDINGS: LUNGS AND PLEURA: The coarse interstitial markings predominantly in the left lower lung are stable. No new infiltrate or overt edema. No pleural effusion. No pneumothorax. HEART AND MEDIASTINUM: No acute abnormality of the cardiac and mediastinal silhouettes. Atheromatous thoracic aorta. Stable right subclavian transvenous pacemaker. BONES AND SOFT TISSUES: No acute osseous abnormality. IMPRESSION: 1. No acute findings. 2. Stable coarse interstitial markings predominantly in the left lower lung. Electronically signed by: Katheleen Faes MD 09/13/2023 05:25 PM EDT RP Workstation: [id number]   Data Reviewed for HPI: Relevant notes from primary care and specialist visits, past discharge summaries as available in EHR, including Care Everywhere. Prior diagnostic testing as pertinent to current admission diagnoses Updated medications and problem lists  for reconciliation ED course, including vitals, labs, imaging, treatment and response to treatment Triage notes, nursing and pharmacy notes and ED provider's notes Notable results as noted above in HPI  Assessment and Plan:  Hepatic encephalopathy NASH cirrhosis s/p TIPS 06/09/2023  CT abdomen nonacute Neurologic checks with fall and aspiration precautions Hold sedating meds of Neurontin  and tramadol  Continue lactulose  45 g 4 times daily Resume nadolol , spironolactone , torsemide    Hypokalemia Repleted in the ED Monitor and correct as needed  Pancytopenia (HCC):  -stable   Hypothyroidism -Synthroid    Chronic hypotension  - Continue midodrine  -Cautiously continue nadolol , spironolactone , torsemide  with hold parameters   Chronic heart failure with preserved ejection fraction (HFpEF) (HCC):  - Clinically dry -2D echo on 10/07/2022 showed EF of 55 to 60% with grade 2      HLD (hyperlipidemia) - Fenofibrate  and zocor    Diabetes mellitus without complication (HCC):  -Sliding scale insulin  - Glargine insulin    OSA - on CPAP   DVT prophylaxis: lovenox   Consults: none  Advance Care Planning:   Code Status: Full Code   Family Communication: none  Disposition Plan: Back to previous home environment  Severity of Illness: The appropriate patient status for this patient is OBSERVATION. Observation status is judged to be reasonable and necessary in order to provide the required intensity of service to ensure the patient's safety. The patient's presenting symptoms, physical exam findings, and initial radiographic and laboratory data in the context of their medical condition is felt to place them at decreased risk for further clinical deterioration. Furthermore, it is anticipated that the patient will be medically stable for discharge from the hospital within 2 midnights of admission.   Author: Delayne LULLA Solian, MD 09/13/2023 8:39 PM  For on call review [email or web address].

## 2023-09-13 NOTE — ED Triage Notes (Addendum)
 Arrives via EMS from home d/t disorientation Daughter reports minor confusion noted last night Pt allegedly slept 15 hrs in total Concern for UTI, may have had urinary retention Pt reported she had a BM and urinated once during the night Normal is A/Ox4, Last known well: yesterday evening  EMS assessment: A/o x1-2,  EMS vitals: 140/80, HR: 60, Paced on EMS monitor, CBG 100, SPO2 98%, CO2 28-30, Temp:  EMS interventions: #20 LFA, BLS  PtHx: Low potassium, UTI, New medication- Metalozim combo w/torsemide   Medication: Torsemide 

## 2023-09-14 DIAGNOSIS — Z794 Long term (current) use of insulin: Secondary | ICD-10-CM | POA: Diagnosis not present

## 2023-09-14 DIAGNOSIS — F419 Anxiety disorder, unspecified: Secondary | ICD-10-CM | POA: Diagnosis present

## 2023-09-14 DIAGNOSIS — I2510 Atherosclerotic heart disease of native coronary artery without angina pectoris: Secondary | ICD-10-CM | POA: Diagnosis present

## 2023-09-14 DIAGNOSIS — I480 Paroxysmal atrial fibrillation: Secondary | ICD-10-CM | POA: Diagnosis present

## 2023-09-14 DIAGNOSIS — Z7985 Long-term (current) use of injectable non-insulin antidiabetic drugs: Secondary | ICD-10-CM | POA: Diagnosis not present

## 2023-09-14 DIAGNOSIS — L8990 Pressure ulcer of unspecified site, unspecified stage: Secondary | ICD-10-CM | POA: Insufficient documentation

## 2023-09-14 DIAGNOSIS — E039 Hypothyroidism, unspecified: Secondary | ICD-10-CM | POA: Diagnosis present

## 2023-09-14 DIAGNOSIS — K7460 Unspecified cirrhosis of liver: Secondary | ICD-10-CM | POA: Diagnosis present

## 2023-09-14 DIAGNOSIS — Z7189 Other specified counseling: Secondary | ICD-10-CM | POA: Diagnosis not present

## 2023-09-14 DIAGNOSIS — K7290 Hepatic failure, unspecified without coma: Secondary | ICD-10-CM | POA: Diagnosis not present

## 2023-09-14 DIAGNOSIS — R4182 Altered mental status, unspecified: Secondary | ICD-10-CM | POA: Diagnosis present

## 2023-09-14 DIAGNOSIS — Z8249 Family history of ischemic heart disease and other diseases of the circulatory system: Secondary | ICD-10-CM | POA: Diagnosis not present

## 2023-09-14 DIAGNOSIS — E876 Hypokalemia: Secondary | ICD-10-CM | POA: Diagnosis present

## 2023-09-14 DIAGNOSIS — K7682 Hepatic encephalopathy: Secondary | ICD-10-CM | POA: Diagnosis present

## 2023-09-14 DIAGNOSIS — R4589 Other symptoms and signs involving emotional state: Secondary | ICD-10-CM | POA: Diagnosis not present

## 2023-09-14 DIAGNOSIS — E785 Hyperlipidemia, unspecified: Secondary | ICD-10-CM | POA: Diagnosis present

## 2023-09-14 DIAGNOSIS — K7581 Nonalcoholic steatohepatitis (NASH): Secondary | ICD-10-CM | POA: Diagnosis present

## 2023-09-14 DIAGNOSIS — D61818 Other pancytopenia: Secondary | ICD-10-CM | POA: Diagnosis present

## 2023-09-14 DIAGNOSIS — G4733 Obstructive sleep apnea (adult) (pediatric): Secondary | ICD-10-CM | POA: Diagnosis present

## 2023-09-14 DIAGNOSIS — I5032 Chronic diastolic (congestive) heart failure: Secondary | ICD-10-CM | POA: Diagnosis present

## 2023-09-14 DIAGNOSIS — Z833 Family history of diabetes mellitus: Secondary | ICD-10-CM | POA: Diagnosis not present

## 2023-09-14 DIAGNOSIS — R252 Cramp and spasm: Secondary | ICD-10-CM | POA: Diagnosis not present

## 2023-09-14 DIAGNOSIS — R54 Age-related physical debility: Secondary | ICD-10-CM | POA: Diagnosis present

## 2023-09-14 DIAGNOSIS — Z66 Do not resuscitate: Secondary | ICD-10-CM | POA: Diagnosis not present

## 2023-09-14 DIAGNOSIS — L89151 Pressure ulcer of sacral region, stage 1: Secondary | ICD-10-CM | POA: Diagnosis present

## 2023-09-14 DIAGNOSIS — E119 Type 2 diabetes mellitus without complications: Secondary | ICD-10-CM | POA: Diagnosis present

## 2023-09-14 DIAGNOSIS — Z885 Allergy status to narcotic agent status: Secondary | ICD-10-CM | POA: Diagnosis not present

## 2023-09-14 DIAGNOSIS — K766 Portal hypertension: Secondary | ICD-10-CM | POA: Diagnosis present

## 2023-09-14 DIAGNOSIS — Z79890 Hormone replacement therapy (postmenopausal): Secondary | ICD-10-CM | POA: Diagnosis not present

## 2023-09-14 DIAGNOSIS — I110 Hypertensive heart disease with heart failure: Secondary | ICD-10-CM | POA: Diagnosis present

## 2023-09-14 LAB — CBC
HCT: 30.2 % — ABNORMAL LOW (ref 36.0–46.0)
Hemoglobin: 10.8 g/dL — ABNORMAL LOW (ref 12.0–15.0)
MCH: 32 pg (ref 26.0–34.0)
MCHC: 35.8 g/dL (ref 30.0–36.0)
MCV: 89.6 fL (ref 80.0–100.0)
Platelets: 109 K/uL — ABNORMAL LOW (ref 150–400)
RBC: 3.37 MIL/uL — ABNORMAL LOW (ref 3.87–5.11)
RDW: 14.9 % (ref 11.5–15.5)
WBC: 3.5 K/uL — ABNORMAL LOW (ref 4.0–10.5)
nRBC: 0 % (ref 0.0–0.2)

## 2023-09-14 LAB — CBG MONITORING, ED
Glucose-Capillary: 110 mg/dL — ABNORMAL HIGH (ref 70–99)
Glucose-Capillary: 114 mg/dL — ABNORMAL HIGH (ref 70–99)
Glucose-Capillary: 114 mg/dL — ABNORMAL HIGH (ref 70–99)
Glucose-Capillary: 145 mg/dL — ABNORMAL HIGH (ref 70–99)

## 2023-09-14 LAB — COMPREHENSIVE METABOLIC PANEL WITH GFR
ALT: 17 U/L (ref 0–44)
AST: 45 U/L — ABNORMAL HIGH (ref 15–41)
Albumin: 2.5 g/dL — ABNORMAL LOW (ref 3.5–5.0)
Alkaline Phosphatase: 59 U/L (ref 38–126)
Anion gap: 9 (ref 5–15)
BUN: 19 mg/dL (ref 8–23)
CO2: 24 mmol/L (ref 22–32)
Calcium: 8.6 mg/dL — ABNORMAL LOW (ref 8.9–10.3)
Chloride: 105 mmol/L (ref 98–111)
Creatinine, Ser: 0.8 mg/dL (ref 0.44–1.00)
GFR, Estimated: >60 mL/min (ref >60)
Glucose, Bld: 108 mg/dL — ABNORMAL HIGH (ref 70–99)
Potassium: 2.9 mmol/L — ABNORMAL LOW (ref 3.5–5.1)
Sodium: 138 mmol/L (ref 135–145)
Total Bilirubin: 2.2 mg/dL — ABNORMAL HIGH (ref 0.0–1.2)
Total Protein: 6.3 g/dL — ABNORMAL LOW (ref 6.5–8.1)

## 2023-09-14 LAB — GLUCOSE, CAPILLARY
Glucose-Capillary: 128 mg/dL — ABNORMAL HIGH (ref 70–99)
Glucose-Capillary: 177 mg/dL — ABNORMAL HIGH (ref 70–99)

## 2023-09-14 LAB — MAGNESIUM: Magnesium: 2.4 mg/dL (ref 1.7–2.4)

## 2023-09-14 MED ORDER — POTASSIUM CHLORIDE 20 MEQ PO PACK
40.0000 meq | PACK | ORAL | Status: AC
  Administered 2023-09-14 (×3): 40 meq via ORAL
  Filled 2023-09-14 (×3): qty 2

## 2023-09-14 MED ORDER — MELATONIN 5 MG PO TABS
5.0000 mg | ORAL_TABLET | Freq: Every evening | ORAL | Status: DC | PRN
  Administered 2023-09-14 – 2023-09-15 (×2): 5 mg via ORAL
  Filled 2023-09-14 (×2): qty 1

## 2023-09-14 NOTE — ED Notes (Signed)
 Pt assisted off of bedpan. Moderate light brown mushy BM noted. Pericare provided. New mepilex dressing and brief applied. Pt pulled up in bed with assistance from another nurse.

## 2023-09-14 NOTE — Progress Notes (Signed)
       CROSS COVER NOTE  NAME: Annette Foster MRN: [id number] DOB : [birth year]    Concern as stated by nurse / staff   This patient is asking to speak with Palliative medicine team RE goals of care. She did initiate the conversation. Looks like she has been seen by PMT in the past. I told her I mention it the MD overnight (not sure if you'd be open to placing the referral versus waiting for day team), but to not expect any changes until tomorrow at the earliest...      Pertinent findings on chart review:   Patient Assessment   Assessment and  Interventions   Assessment:  Request to speak with palliative care  Plan: Consult placed for am assessment X X

## 2023-09-14 NOTE — ED Notes (Signed)
 Pt incontinent of stool. Moderate liquid light brown BM. Pericare provided and new brief applied. Bed in lowest position with wheels locked. Call light within reach. Side rails up.

## 2023-09-14 NOTE — ED Notes (Signed)
 Small amount of liquid stool produced. Pericare provided. New brief applied. Pt tolerated well. Denies any needs at this time. Call light within reach.

## 2023-09-14 NOTE — ED Notes (Signed)
 Pt placed on bedpan at this time. Call light within reach.

## 2023-09-14 NOTE — ED Notes (Signed)
 Assisted patient with the bedpad. Pericare performed. Dry brief applied.

## 2023-09-14 NOTE — Plan of Care (Signed)

## 2023-09-14 NOTE — Plan of Care (Signed)
 The patient remains on AR-2A as of time of writing. The patient is AA+Ox4. No supplemental O2. The patient initiated a conversation overnight regarding palliative care: a secure chat was sent to Dr. Cleatus at 2014 hours by this RN; see new orders. The patient also requested medication overnight for sleep; secure chat sent to Dr. Cleatus at 2024 hours by this RN; see new orders. The patient remains tearful overnight; emotional support provided by staff.    Problem: Education: Goal: Ability to describe self-care measures that may prevent or decrease complications (Diabetes Survival Skills Education) will improve Outcome: Progressing Goal: Individualized Educational Video(s) Outcome: Progressing   Problem: Coping: Goal: Ability to adjust to condition or change in health will improve Outcome: Progressing   Problem: Fluid Volume: Goal: Ability to maintain a balanced intake and output will improve Outcome: Progressing   Problem: Health Behavior/Discharge Planning: Goal: Ability to identify and utilize available resources and services will improve Outcome: Progressing Goal: Ability to manage health-related needs will improve Outcome: Progressing   Problem: Metabolic: Goal: Ability to maintain appropriate glucose levels will improve Outcome: Progressing   Problem: Nutritional: Goal: Maintenance of adequate nutrition will improve Outcome: Progressing Goal: Progress toward achieving an optimal weight will improve Outcome: Progressing   Problem: Skin Integrity: Goal: Risk for impaired skin integrity will decrease Outcome: Progressing   Problem: Tissue Perfusion: Goal: Adequacy of tissue perfusion will improve Outcome: Progressing   Problem: Education: Goal: Knowledge of General Education information will improve Description: Including pain rating scale, medication(s)/side effects and non-pharmacologic comfort measures Outcome: Progressing   Problem: Health Behavior/Discharge  Planning: Goal: Ability to manage health-related needs will improve Outcome: Progressing   Problem: Clinical Measurements: Goal: Ability to maintain clinical measurements within normal limits will improve Outcome: Progressing Goal: Will remain free from infection Outcome: Progressing Goal: Diagnostic test results will improve Outcome: Progressing Goal: Respiratory complications will improve Outcome: Progressing Goal: Cardiovascular complication will be avoided Outcome: Progressing   Problem: Activity: Goal: Risk for activity intolerance will decrease Outcome: Progressing   Problem: Nutrition: Goal: Adequate nutrition will be maintained Outcome: Progressing   Problem: Coping: Goal: Level of anxiety will decrease Outcome: Progressing   Problem: Elimination: Goal: Will not experience complications related to bowel motility Outcome: Progressing Goal: Will not experience complications related to urinary retention Outcome: Progressing   Problem: Pain Managment: Goal: General experience of comfort will improve and/or be controlled Outcome: Progressing   Problem: Safety: Goal: Ability to remain free from injury will improve Outcome: Progressing   Problem: Skin Integrity: Goal: Risk for impaired skin integrity will decrease Outcome: Progressing   Problem: Education: Goal: Ability to demonstrate management of disease process will improve Outcome: Progressing Goal: Ability to verbalize understanding of medication therapies will improve Outcome: Progressing Goal: Individualized Educational Video(s) Outcome: Progressing   Problem: Activity: Goal: Capacity to carry out activities will improve Outcome: Progressing   Problem: Cardiac: Goal: Ability to achieve and maintain adequate cardiopulmonary perfusion will improve Outcome: Progressing   Problem: Education: Goal: Knowledge of disease or condition will improve Outcome: Progressing Goal: Understanding of medication  regimen will improve Outcome: Progressing Goal: Individualized Educational Video(s) Outcome: Progressing   Problem: Activity: Goal: Ability to tolerate increased activity will improve Outcome: Progressing   Problem: Cardiac: Goal: Ability to achieve and maintain adequate cardiopulmonary perfusion will improve Outcome: Progressing   Problem: Health Behavior/Discharge Planning: Goal: Ability to safely manage health-related needs after discharge will improve Outcome: Progressing

## 2023-09-14 NOTE — Progress Notes (Addendum)
 Progress Note    Annette Foster  FMW:[id number] DOB: Jan 08, 1941  DOA: 09/13/2023 PCP: Alla Amis, MD      Brief Narrative:    Medical records reviewed and are as summarized below:  Annette Foster is a 83 y.o. 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  She presented to the hospital because of altered mental status.  Ammonia was elevated (82) and altered mental status was attributed to acute hepatic encephalopathy.        Assessment/Plan:   Principal Problem:   Acute hepatic encephalopathy (HCC) Active Problems:   Decompensated nonalcoholic liver cirrhosis (HCC)   Chronic diastolic CHF (congestive heart failure) (HCC)   Paroxysmal atrial fibrillation (HCC)   Pressure injury of skin   Body mass index is 22.87 kg/m.   Acute hepatic encephalopathy, in the setting of NASH liver cirrhosis s/p TIPS on 06/09/2023: Ammonia was 82 on admission.  Ammonia was normal at 24 2 weeks prior to admission. Continue lactulose  and rifaximin .  Continue nadolol , spironolactone  and torsemide .  Continue supportive care.  Start heart healthy diet. Neurontin  and tramadol  have been held.   Hypokalemia: Potassium down from 3-2.9.  Continue potassium repletion   Pancytopenia: Stable.  Likely due to liver cirrhosis.   Chronic diastolic CHF:  Compensated.  2D echo in August 2024 showed EF estimated at 55 to 60%, grade 2 diastolic dysfunction.   Paroxysmal atrial fibrillation s/p AVN ablation and PPM: Not on any anticoagulation because of history of GI bleed and esophageal varices and recurrent falls   Comorbidities include OSA on CPAP, hyperlipidemia, chronic hypotension on midodrine , hypothyroidism     Diet Order             Diet Heart Fluid consistency: Thin  Diet effective now                            Consultants: None  Procedures: None    Medications:    enoxaparin  (LOVENOX ) injection  40 mg Subcutaneous Q24H   insulin  aspart  0-9 Units Subcutaneous Q4H   lactulose   30 g Oral QID   levothyroxine   50 mcg Oral Q0600   [START ON 09/15/2023] metolazone   2.5 mg Oral Once per day on Monday Thursday   pantoprazole   40 mg Oral Daily   potassium chloride   40 mEq Oral Q4H   rifaximin   550 mg Oral BID   torsemide   60 mg Oral Daily   Continuous Infusions:   Anti-infectives (From admission, onward)    Start     Dose/Rate Route Frequency Ordered Stop   09/13/23 2200  rifaximin  (XIFAXAN ) tablet 550 mg        550 mg Oral 2 times daily 09/13/23 2045                Family Communication/Anticipated D/C date and plan/Code Status   DVT prophylaxis: enoxaparin  (LOVENOX ) injection 40 mg Start: 09/13/23 2200  Code Status: Full Code  Family Communication: None Disposition Plan: Plan to discharge home   Status is: Observation The patient will require care spanning > 2 midnights and should be moved to inpatient because: Hepatic encephalopathy       Subjective:   Interval events noted.  She is confused and cannot provide an adequate history.  She is asking for something to eat.  Objective:    Vitals:   09/14/23 0124 09/14/23 0500 09/14/23 0602 09/14/23 1123  BP: (!) 125/44 (!) 123/46  (!) 126/43  Pulse: 60 (!) 59  (!) 58  Resp: 20 18  16   Temp: 97.8 F (36.6 C)  (!) 97.5 F  (36.4 C) 98.3 F (36.8 C)  TempSrc: Oral  Oral Oral  SpO2: 100% 100%  100%  Weight:      Height:       No data found.  No intake or output data in the 24 hours ending 09/14/23 1211 Filed Weights   09/13/23 1710  Weight: 58.6 kg    Exam:  GEN: NAD, tearful SKIN: Warm and dry EYES: No pallor or icterus ENT: MMM CV: RRR PULM: CTA B ABD: soft, ND, NT, +BS CNS: AAO x 1 (person), speech appears stuttered, non focal EXT: No edema or tenderness       Data Reviewed:   I have personally reviewed following labs and imaging studies:  Labs: Labs show the following:   Basic Metabolic Panel: Recent Labs  Lab 09/13/23 1715 09/14/23 0448  NA 136 138  K 3.0* 2.9*  CL 97* 105  CO2 25 24  GLUCOSE 105* 108*  BUN 21 19  CREATININE 0.88 0.80  CALCIUM 8.9 8.6*  MG 2.4  --    GFR Estimated Creatinine Clearance: 44.8 mL/min (by C-G formula based on SCr of 0.8 mg/dL). Liver Function Tests: Recent Labs  Lab 09/13/23 1715 09/14/23 0448  AST 49* 45*  ALT 17 17  ALKPHOS 66 59  BILITOT 2.6* 2.2*  PROT 6.9 6.3*  ALBUMIN  2.8* 2.5*   Recent Labs  Lab 09/13/23 1715  LIPASE 37   Recent Labs  Lab 09/13/23 1715  AMMONIA 82*   Coagulation profile No results for input(s): INR, PROTIME in the last 168 hours.  CBC: Recent Labs  Lab 09/13/23 1715 09/14/23 0448  WBC 4.8 3.5*  NEUTROABS 2.0  --   HGB 11.9* 10.8*  HCT 33.2* 30.2*  MCV 90.0 89.6  PLT 112* 109*   Cardiac Enzymes: No results for input(s): CKTOTAL, CKMB, CKMBINDEX, TROPONINI in the last 168 hours. BNP (last 3 results) No results for input(s): PROBNP in the last 8760 hours. CBG: Recent Labs  Lab 09/13/23 2129 09/14/23 0041 09/14/23 0443 09/14/23 0832 09/14/23 1121  GLUCAP 165* 114* 110* 114* 145*   D-Dimer: No results for input(s): DDIMER in the last 72 hours. Hgb A1c: No results for input(s): HGBA1C in the last 72 hours. Lipid Profile: No results for input(s): CHOL,  HDL, LDLCALC, TRIG, CHOLHDL, LDLDIRECT in the last 72 hours. Thyroid  function studies: No results for input(s): TSH, T4TOTAL, T3FREE, THYROIDAB in the last 72 hours.  Invalid input(s): FREET3 Anemia work up: No results for input(s): VITAMINB12, FOLATE, FERRITIN, TIBC, IRON , RETICCTPCT in the last 72 hours. Sepsis Labs: Recent Labs  Lab 09/13/23 1715 09/14/23 0448  WBC 4.8 3.5*    Microbiology No results found for this or any previous visit (from the past 240 hours).  Procedures and diagnostic studies:  CT Head Wo Contrast Result Date: 09/13/2023 CLINICAL  DATA:  Altered level of consciousness EXAM: CT HEAD WITHOUT CONTRAST TECHNIQUE: Contiguous axial images were obtained from the base of the skull through the vertex without intravenous contrast. RADIATION DOSE REDUCTION: This exam was performed according to the departmental dose-optimization program which includes automated exposure control, adjustment of the mA and/or kV according to patient size and/or use of iterative reconstruction technique. COMPARISON:  08/27/2023 FINDINGS: Brain: No acute infarct or hemorrhage. Lateral ventricles and midline structures are unremarkable. No acute extra-axial fluid collections. No mass effect. Vascular: No hyperdense vessel or unexpected calcification. Skull: Normal. Negative for fracture or focal lesion. Sinuses/Orbits: Mucosal thickening within the ethmoid and maxillary sinuses. Other: None. IMPRESSION: 1. No acute intracranial process. Electronically Signed   By: Ozell Daring M.D.   On: 09/13/2023 19:00   CT ABDOMEN PELVIS W CONTRAST Result Date: 09/13/2023 CLINICAL DATA:  Abdominal pain, acute, nonlocalized. EXAM: CT ABDOMEN AND PELVIS WITH CONTRAST TECHNIQUE: Multidetector CT imaging of the abdomen and pelvis was performed using the standard protocol following bolus administration of intravenous contrast. RADIATION DOSE REDUCTION: This exam was performed according to  the departmental dose-optimization program which includes automated exposure control, adjustment of the mA and/or kV according to patient size and/or use of iterative reconstruction technique. CONTRAST:  OMNIPAQUE  IOHEXOL  300 MG/ML  SOLN COMPARISON:  08/01/2023. FINDINGS: Lower chest: The heart is enlarged and there is a trace pericardial effusion. Pacemaker leads are noted in the heart. There is a trace right pleural effusion. Interstitial prominence and a few airspace opacities are noted at the lung bases, improved from the previous exam. Hepatobiliary: The liver has a nodular contour, compatible with underlying cirrhosis. No focal abnormality is seen in the liver. The gallbladder is surgically absent. No biliary ductal dilatation. A tips shunt is noted. Pancreas: Unremarkable. No pancreatic ductal dilatation or surrounding inflammatory changes. Spleen: The spleen is enlarged.  No focal abnormality is seen. Adrenals/Urinary Tract: The adrenal glands are within normal limits. The kidneys enhance symmetrically. No renal calculus or hydronephrosis bilaterally. The bladder is unremarkable. Stomach/Bowel: The stomach is within normal limits. No bowel obstruction, free air, or pneumatosis is seen. Appendix is not seen. Vascular/Lymphatic: Aortic atherosclerosis. No enlarged abdominal or pelvic lymph nodes. Reproductive: Status post hysterectomy. No adnexal masses. Other: No abdominopelvic ascites. Musculoskeletal: Degenerative changes are present in the thoracolumbar spine. No acute osseous abnormality. IMPRESSION: 1. No acute intra-abdominal process. 2. Morphologic changes of cirrhosis and portal hypertension. TIP shunt in place. 3. Interstitial prominence with a few scattered airspace opacities at the lung bases, improved from the prior exam. 4. Aortic atherosclerosis. Electronically Signed   By: Leita Birmingham M.D.   On: 09/13/2023 18:57   DG Chest Portable 1 View Result Date: 09/13/2023 EXAM: 1 VIEW XRAY OF  THE CHEST 09/13/2023 05:19:53 PM COMPARISON: 08/27/2023 CLINICAL HISTORY: Weakness, AMS. Arrives via EMS from home d/t disorientation; Daughter reports minor confusion noted last night FINDINGS: LUNGS AND PLEURA: The coarse interstitial markings predominantly in the left lower lung are stable. No new infiltrate or overt edema. No pleural effusion. No pneumothorax. HEART AND MEDIASTINUM: No acute abnormality of the cardiac and mediastinal silhouettes. Atheromatous thoracic aorta. Stable right subclavian transvenous pacemaker. BONES AND SOFT TISSUES: No acute osseous abnormality. IMPRESSION: 1. No acute findings. 2. Stable coarse interstitial markings predominantly in the left lower lung. Electronically signed by: Dayne Hassell MD 09/13/2023 05:25 PM EDT RP Workstation: [id number]               LOS: 0 days  Ladavia Lindenbaum  [employer] loss adjuster on [email or web address]. If 7PM-7AM, please contact night-coverage at [email or web address]     09/14/2023, 12:11 PM

## 2023-09-14 NOTE — ED Notes (Signed)
 This tech placed pt. On bed pan at this time. Pt brief changed and new chucks placed.

## 2023-09-14 NOTE — ED Notes (Signed)
 Pt requested moisturizer for lips. Moisturizer provided and assisted with applying. Pt denies any other needs at this time. Bed in lowest position with wheels locked. Call light within reach.

## 2023-09-15 DIAGNOSIS — K7682 Hepatic encephalopathy: Secondary | ICD-10-CM | POA: Diagnosis not present

## 2023-09-15 DIAGNOSIS — Z7189 Other specified counseling: Secondary | ICD-10-CM | POA: Diagnosis not present

## 2023-09-15 LAB — RENAL FUNCTION PANEL
Albumin: 2.7 g/dL — ABNORMAL LOW (ref 3.5–5.0)
Anion gap: 9 (ref 5–15)
BUN: 17 mg/dL (ref 8–23)
CO2: 24 mmol/L (ref 22–32)
Calcium: 8.5 mg/dL — ABNORMAL LOW (ref 8.9–10.3)
Chloride: 106 mmol/L (ref 98–111)
Creatinine, Ser: 0.87 mg/dL (ref 0.44–1.00)
GFR, Estimated: >60 mL/min (ref >60)
Glucose, Bld: 128 mg/dL — ABNORMAL HIGH (ref 70–99)
Phosphorus: 4 mg/dL (ref 2.5–4.6)
Potassium: 2.9 mmol/L — ABNORMAL LOW (ref 3.5–5.1)
Sodium: 139 mmol/L (ref 135–145)

## 2023-09-15 LAB — HEMOGLOBIN A1C
Hgb A1c MFr Bld: 4.3 % — ABNORMAL LOW (ref 4.8–5.6)
Mean Plasma Glucose: 77 mg/dL

## 2023-09-15 LAB — GLUCOSE, CAPILLARY
Glucose-Capillary: 114 mg/dL — ABNORMAL HIGH (ref 70–99)
Glucose-Capillary: 118 mg/dL — ABNORMAL HIGH (ref 70–99)
Glucose-Capillary: 124 mg/dL — ABNORMAL HIGH (ref 70–99)
Glucose-Capillary: 131 mg/dL — ABNORMAL HIGH (ref 70–99)
Glucose-Capillary: 148 mg/dL — ABNORMAL HIGH (ref 70–99)
Glucose-Capillary: 159 mg/dL — ABNORMAL HIGH (ref 70–99)
Glucose-Capillary: 91 mg/dL (ref 70–99)

## 2023-09-15 LAB — CBC
HCT: 31.9 % — ABNORMAL LOW (ref 36.0–46.0)
Hemoglobin: 11.3 g/dL — ABNORMAL LOW (ref 12.0–15.0)
MCH: 31.7 pg (ref 26.0–34.0)
MCHC: 35.4 g/dL (ref 30.0–36.0)
MCV: 89.6 fL (ref 80.0–100.0)
Platelets: 107 K/uL — ABNORMAL LOW (ref 150–400)
RBC: 3.56 MIL/uL — ABNORMAL LOW (ref 3.87–5.11)
RDW: 14.6 % (ref 11.5–15.5)
WBC: 3.6 K/uL — ABNORMAL LOW (ref 4.0–10.5)
nRBC: 0 % (ref 0.0–0.2)

## 2023-09-15 MED ORDER — SODIUM CHLORIDE 0.9 % IV SOLN
12.5000 mg | Freq: Four times a day (QID) | INTRAVENOUS | Status: DC | PRN
  Administered 2023-09-15: 12.5 mg via INTRAVENOUS
  Filled 2023-09-15: qty 12.5

## 2023-09-15 MED ORDER — METHOCARBAMOL 500 MG PO TABS
500.0000 mg | ORAL_TABLET | Freq: Three times a day (TID) | ORAL | Status: DC | PRN
  Administered 2023-09-15 – 2023-09-16 (×2): 500 mg via ORAL
  Filled 2023-09-15 (×2): qty 1

## 2023-09-15 MED ORDER — POTASSIUM CHLORIDE 20 MEQ PO PACK
40.0000 meq | PACK | Freq: Four times a day (QID) | ORAL | Status: AC
  Administered 2023-09-15 (×2): 40 meq via ORAL
  Filled 2023-09-15 (×2): qty 2

## 2023-09-15 MED ORDER — ORAL CARE MOUTH RINSE
15.0000 mL | OROMUCOSAL | Status: DC | PRN
Start: 2023-09-15 — End: 2023-09-17

## 2023-09-15 MED ORDER — POTASSIUM CHLORIDE 10 MEQ/100ML IV SOLN
10.0000 meq | INTRAVENOUS | Status: DC
  Administered 2023-09-15: 10 meq via INTRAVENOUS
  Filled 2023-09-15 (×5): qty 100

## 2023-09-15 MED ORDER — TRAMADOL HCL 50 MG PO TABS
50.0000 mg | ORAL_TABLET | Freq: Four times a day (QID) | ORAL | Status: AC | PRN
  Administered 2023-09-15 – 2023-09-16 (×2): 50 mg via ORAL
  Filled 2023-09-15 (×2): qty 1

## 2023-09-15 MED ORDER — POTASSIUM CHLORIDE 20 MEQ PO PACK
40.0000 meq | PACK | ORAL | Status: DC
  Administered 2023-09-15: 40 meq via ORAL
  Filled 2023-09-15: qty 2

## 2023-09-15 NOTE — TOC Initial Note (Signed)
 Transition of Care [facility]) - Initial/Assessment Note    Patient Details  Name: Annette Foster MRN: [id number] Date of Birth: 1940/06/12  Transition of Care [facility]) CM/SW Contact:    Lauraine JAYSON Carpen, LCSW Phone Number: 09/15/2023, 3:14 PM  Clinical Narrative:   CSW met with patient. Daughter, brother, and church family at bedside. Patient gave CSW permission to have discussion with everyone present. CSW introduced role and explained that discharge planning would be discussed. Per palliative, plan for home with hospice. CSW provided list of agencies that serve [facility]. Daughter will review and determine preference. No DME needs. No further concerns. CSW will continue to follow patient and her family for support and facilitate return home once stable. Family will transport her home at discharge.               Expected Discharge Plan: Home w Hospice Care Barriers to Discharge: Continued Medical Work up   Patient Goals and CMS Choice            Expected Discharge Plan and Services     Post Acute Care Choice: Hospice Living arrangements for the past 2 months: Single Family Home                                      Prior Living Arrangements/Services Living arrangements for the past 2 months: Single Family Home Lives with:: Adult Children Patient language and need for interpreter reviewed:: Yes Do you feel safe going back to the place where you live?: Yes      Need for Family Participation in Patient Care: Yes (Comment) Care giver support system in place?: Yes (comment) Current home services: DME Criminal Activity/Legal Involvement Pertinent to Current Situation/Hospitalization: No - Comment as needed  Activities of Daily Living   ADL Screening (condition at time of admission) Independently performs ADLs?: No Does the patient have a NEW difficulty with bathing/dressing/toileting/self-feeding that is expected to last >3 days?: No Does the patient have a NEW  difficulty with getting in/out of bed, walking, or climbing stairs that is expected to last >3 days?: No Does the patient have a NEW difficulty with communication that is expected to last >3 days?: No Is the patient deaf or have difficulty hearing?: No Does the patient have difficulty seeing, even when wearing glasses/contacts?: No Does the patient have difficulty concentrating, remembering, or making decisions?: Yes  Permission Sought/Granted Permission sought to share information with : Facility Medical sales representative, Family Supports Permission granted to share information with : Yes, Verbal Permission Granted  Share Information with NAME: Mitzie Ned  Permission granted to share info w AGENCY: Hospice Agencies  Permission granted to share info w Relationship: Daughter  Permission granted to share info w Contact Information: [phone]  Emotional Assessment Appearance:: Appears stated age Attitude/Demeanor/Rapport: Engaged, Gracious Affect (typically observed): Accepting, Appropriate, Calm, Pleasant Orientation: : Oriented to Self, Oriented to Place, Oriented to  Time, Oriented to Situation Alcohol / Substance Use: Not Applicable Psych Involvement: No (comment)  Admission diagnosis:  Hepatic encephalopathy (HCC) [K76.82] Acute hepatic encephalopathy (HCC) [K76.82] Altered mental status, unspecified altered mental status type [R41.82] Patient Active Problem List   Diagnosis Date Noted   Pressure injury of skin 09/14/2023   Weakness 08/28/2023   Acute on chronic diastolic CHF (congestive heart failure) (HCC) 08/27/2023   HLD (hyperlipidemia) 08/27/2023   Myocardial injury 08/27/2023   Constipation 08/03/2023   Intractable nausea and vomiting  08/02/2023   Intractable vomiting with nausea 08/01/2023   Hyperammonemia (HCC) 08/01/2023   Aspiration pneumonia (HCC) 06/28/2023   Chronic diastolic CHF (congestive heart failure) (HCC) 06/28/2023   Acute hepatic encephalopathy (HCC)  06/28/2023   Liver cirrhosis secondary to NASH (HCC) 06/05/2023   Hypotension 06/05/2023   Refractory ascites 05/30/2023   Hyperlipidemia, unspecified 05/30/2023   Diabetes mellitus without complication (HCC) 05/30/2023   Overweight (BMI 25.0-29.9) 05/30/2023   Anxiety 05/30/2023   Decompensated nonalcoholic liver cirrhosis (HCC) 05/21/2023   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 05/21/2023   OSA on CPAP 05/21/2023   IDA (iron  deficiency anemia) 05/21/2023   Abdominal pain 03/03/2023   Electrolyte abnormality 03/03/2023   ABLA (acute blood loss anemia) 03/02/2023   Non-alcoholic fatty liver disease 01/30/2023   Sepsis due to pneumonia (HCC) 01/29/2023   GERD without esophagitis 01/29/2023   Dyslipidemia 01/29/2023   Type 2 diabetes mellitus with complication, with long-term current use of insulin  (HCC) 01/29/2023   Hypokalemia 01/29/2023   Hyponatremia 01/29/2023   GI bleeding 01/28/2023   H/O atrioventricular nodal ablation 11/13/2022   Esophageal varices without bleeding (HCC) 10/17/2022   Pneumonia 09/11/2022   Adenomatous polyp of colon 09/10/2022   Rectal bleeding 09/08/2022   Leg pain 07/18/2022   Varicose veins with inflammation 07/17/2022   Unspecified injury of head, initial encounter 09/14/2021   Rectal lesion    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) 08/22/2021   Persistent atrial fibrillation (HCC) 05/17/2021   S/P placement of cardiac pacemaker 03/23/2021   Portal hypertension (HCC)    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 bleeding (HCC)    Chronic heart failure with preserved ejection fraction (HFpEF) (HCC) 04/05/2020   Hepatic cirrhosis (HCC) 04/05/2020   Abnormal EKG 04/05/2020   Allergic rhinitis due to pollen 02/24/2020   Chronic allergic conjunctivitis 02/24/2020   Allergic rhinitis due to animal (cat) (dog) hair and dander 02/24/2020   Allergic rhinitis 02/24/2020   Polyp of transverse colon    Hematochezia 02/17/2020    Chronic anticoagulation 02/17/2020   Anasarca 02/17/2020   Symptomatic sinus bradycardia 02/17/2020   Paroxysmal atrial fibrillation (HCC) 02/17/2020   AKI (acute kidney injury) (HCC) 02/17/2020   History of colitis 02/17/2020   Acute GI bleeding    Atrial fibrillation with RVR (HCC) 02/07/2020   NASH (nonalcoholic steatohepatitis)    Hav (hallux abducto valgus), unspecified laterality 06/03/2019   Acquired bilateral hammer toes 06/03/2019   Iron  deficiency 04/26/2019   Right-sided chest wall pain 04/12/2019   Mid back pain on right side 04/12/2019   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 11/03/2017   Recurrent UTI (urinary tract infection) 07/23/2017   Nausea vomiting and diarrhea 01/11/2017   Hypothyroidism, unspecified 01/11/2017   Essential hypertension 01/11/2017   GERD (gastroesophageal reflux disease) 01/11/2017   Pancytopenia (HCC) 11/14/2016   Monilial vulvitis 11/06/2016   Spongiotic psoriasiform dermatitis 10/08/2016   Status post hysterectomy 06/20/2016   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 [place] [address] Phone: [phone] Fax: [phone]     Social Drivers of Health (SDOH) Social History: SDOH Screenings   Food Insecurity: No Food Insecurity (09/14/2023)  Housing: Low Risk  (09/14/2023)  Transportation Needs: No Transportation Needs (09/14/2023)  Utilities: Not At Risk (09/14/2023)  Physicist, medical  Strain: Medium Risk (07/17/2023)   Received from [facility]  Physical Activity: Unknown (01/30/2018)   Received from [facility]  Social Connections: Moderately Isolated (09/14/2023)  Tobacco Use: Low Risk  (09/13/2023)   SDOH Interventions:     Readmission Risk Interventions     No data to display

## 2023-09-15 NOTE — IPAL (Signed)
  Interdisciplinary Goals of Care Family Meeting   Date carried out: 09/15/2023  Location of the meeting: Bedside  Member's involved: Physician and Family Member or next of kin  Durable Power of Attorney or acting medical decision maker: Patient and daughter, Annette Foster  Discussion: We discussed diagnoses, prognosis and goals of care for Annette Foster .  She said she wanted to be DNR because she was tired of doing this.  We discussed palliative care and hospice.  She said she was ready to speak with the palliative care team.  She was not quite ready for hospice at the time of this discussion.  She said she is very familiar with hospice.  Patient was tearful during this discussion.  Emotional support was offered.  I called her daughter, Annette, to make her aware of patient's decision.  She was agreeable.  Code status:   Code Status: Limited: Do not attempt resuscitation (DNR) -DNR-LIMITED -Do Not Intubate/DNI    Disposition: Continue current acute care  Time spent for the meeting: 10 minutes    AIDA CHO, MD  09/15/2023, 9:16 AM

## 2023-09-15 NOTE — Consult Note (Addendum)
 Consultation Note Date: 09/15/2023   Patient Name: Annette Foster  DOB: 27-Jun-1940  MRN: [id number]  Age / Sex: 83 y.o., female  PCP: Alla Amis, MD Referring Physician: Jens Durand, MD  Reason for Consultation: Establishing goals of care  HPI/Patient Profile: PER H&P: Annette Foster is a 83 y.o. 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  Workup otherwise unrevealing for other etiology to her altered mental status. She reportedly developed some confusion the night prior, though at baseline she is alert and oriented x 4.  During the day today her confusion worsened thus prompting a call to 911.  She had no vomiting, nausea or change in bowel habits and has been having her usual 3 BMs daily with her lactulose .   Clinical Assessment and Goals of Care: Notes reviewed from current and previous admission, and labs reviewed.  Into see patient.  She is currently sitting in bedside chair at this time.  She currently has potassium infusion running, and nursing into bedside to stop it as patient cannot tolerate the infusion due to burning and cannot have increased levels of saline to neutralize the burning due to  CHF.  We discussed her diagnoses, prognosis, GOC, EOL wishes disposition and options.  Created space and opportunity for patient  to explore thoughts and feelings regarding current medical information.   A detailed discussion was had today regarding advanced directives.  Concepts specific to code status, artifical feeding and hydration, IV antibiotics and rehospitalization were discussed.  The difference between an aggressive medical intervention path and a comfort care path was discussed.  Values and goals of care important to patient and family were attempted to be elicited.  Discussed limitations of medical interventions to prolong quality of life in some situations and discussed the concept of human mortality.  She states she is aware of strong faith.  Patient states she has been living with her daughter since April.  She states her daughter lives [place] the farm and raises chickens. She discusses family dynamics, estrangement from great-grandchildren, and the deaths of her son and grandson.  She states her ex-husband died in Nov 11, 2023, and her son died in 2023-03-13.  She discusses that her current husband who was a pastor has dementia and lives in a nursing home.    Patient states she is spoken with palliative medicine team in the past and is aware of palliative and hospice level care.  She is able to articulate her status and  discusses previous hospitalizations.  She states I am tired of being tired.  She states she has tried to do the things that she is supposed to do but then has to return to the hospital for illness.  She states she has made the decision that she is ready for hospice care.  She states she has told her family for the past few weeks that her time is coming close, and that the [name] is soon going to call her home.  She states she wants to continue care to maintain her cognitive status so that she is able to go see her husband and her husband at the nursing home and spend time with  him prior to death.  Attempted to call her daughter x 2 while at bedside unsuccessfully.  Stepped out and called daughter.  Daughter confirms that she is taking her medications including lactulose  as prescribed. Her daughter states she understands her mother's status and prognosis, and is amenable to hospice at home.  Team made aware.  SUMMARY OF RECOMMENDATIONS   Patient would like hospice at home; daughter is in agreement with this plan.  Prognosis:  < 6 weeks       Primary Diagnoses: Present on Admission:  Acute hepatic encephalopathy (HCC)  Chronic diastolic CHF (congestive heart failure) (HCC)  Decompensated nonalcoholic liver cirrhosis (HCC)  Paroxysmal atrial fibrillation (HCC)   I have reviewed the medical record, interviewed the patient and family, and examined the patient. The following aspects are pertinent.  Past Medical History:  Diagnosis Date   Abdominal pain    Allergy    Cancer (HCC) 2017   breast cancer- Left   Cataract    CHF (congestive heart failure) (HCC)    Collagenous colitis    Coronary artery disease    Diabetes mellitus without complication (HCC)    Diarrhea    Diverticulosis    Dysrhythmia    Fatty liver disease, nonalcoholic    Fibrocystic breast    GERD (gastroesophageal reflux disease)    Heart murmur    Hyperlipidemia    Hypertension    Hypothyroidism    IBS (irritable bowel syndrome)    IDA (iron  deficiency anemia)    Liver cirrhosis (HCC)    Personal history of radiation therapy 2017   LEFT BREAST CA   Presence of permanent cardiac pacemaker    Sleep apnea    C-Pap   Social History   Socioeconomic History   Marital status: Married    Spouse name: Alexa   Number of children: Not on file   Years of education: Not on file   Highest education level: Not on file  Occupational History   Not on file  Tobacco Use   Smoking status: Never   Smokeless tobacco: Never  Vaping Use   Vaping status: Never Used  Substance and  Sexual Activity   Alcohol use: Never   Drug use: Never   Sexual activity: Not Currently    Birth control/protection: Surgical  Other Topics Concern   Not on file  Social History Narrative    Husband lives at nursing home , lives with husbands sister   Social Drivers of Health   Financial Resource Strain: Medium Risk (07/17/2023)   Received from [facility]   Overall Financial Resource Strain (CARDIA)    Difficulty of Paying Living Expenses: Somewhat hard  Food Insecurity: No Food Insecurity (09/14/2023)   Hunger Vital Sign    Worried About Running Out of Food in the Last  Year: Never true    Ran Out of Food in the Last Year: Never true  Transportation Needs: No Transportation Needs (09/14/2023)   PRAPARE - Administrator, Civil Service (Medical): No    Lack of Transportation (Non-Medical): No  Physical Activity: Unknown (01/30/2018)   [facility]   Exercise Vital Sign    Days of Exercise per Week: 7 days    Minutes of Exercise per Session: Not on file  Stress: Not on file  Social Connections: Moderately Isolated (09/14/2023)   Social Connection and Isolation Panel    Frequency of Communication with Friends and Family: Once a week    Frequency of Social Gatherings with Friends and Family: Once a week    Attends Religious Services: More than 4 times per year    Active Member of [employer] or Organizations: No    Attends Engineer, structural: Never    Marital Status: Married   Family History  Problem Relation Age of Onset   Breast cancer Paternal Grandmother    Colon cancer Father    Diabetes Sister    Diabetes Brother    Heart disease Brother    Prostate cancer Brother    Colon cancer Maternal Uncle    Prostate cancer Brother    Bladder Cancer Brother    Leukemia Mother        all   Ovarian cancer Neg Hx    Kidney cancer Neg Hx    Scheduled Meds:  enoxaparin  (LOVENOX ) injection  40 mg Subcutaneous Q24H   insulin  aspart   0-9 Units Subcutaneous Q4H   lactulose   30 g Oral QID   levothyroxine   50 mcg Oral Q0600   metolazone   2.5 mg Oral Once per day on Monday Thursday   pantoprazole   40 mg Oral Daily   potassium chloride   40 mEq Oral Q6H   rifaximin   550 mg Oral BID   torsemide   60 mg Oral Daily   Continuous Infusions:  promethazine  (PHENERGAN ) injection (IM or IVPB)     PRN Meds:.acetaminophen  **OR** acetaminophen , albuterol , melatonin, ondansetron  **OR** ondansetron  (ZOFRAN ) IV, mouth rinse, promethazine  (PHENERGAN ) injection (IM or IVPB) Medications Prior to Admission:  Prior to Admission medications   Medication Sig Start Date End Date Taking? Authorizing Provider  Cholecalciferol  (VITAMIN D ) 50 MCG (2000 UT) CAPS Take 2,000 Units by mouth daily.   Yes [provider]  clobetasol  ointment (TEMOVATE ) 0.05 % Apply 1 application topically as needed (vaginal irritation).   Yes [provider]  clotrimazole  (LOTRIMIN ) 1 % cream Apply 1 Application topically 2 (two) times daily as needed.   Yes [provider]  cyanocobalamin (,VITAMIN B-12,) 1000 MCG/ML injection 1,000 mcg every 30 (thirty) days. 04/23/19  Yes [provider]  EPINEPHrine  0.3 mg/0.3 mL IJ SOAJ injection Inject 0.3 mg into the muscle. 05/18/19  Yes [provider]  esomeprazole (NEXIUM) 40 MG capsule Take 40 mg by mouth 2 (two) times daily before a meal.  02/08/16  Yes [provider]  fenofibrate  160 MG tablet Take 160 mg by mouth at bedtime.   Yes [provider]  gabapentin  (NEURONTIN ) 300 MG capsule Take 300 mg by mouth at bedtime. 09/07/16 10/25/36 Yes Corlis Honor BROCKS, MD  Insulin  Aspart FlexPen (NOVOLOG ) 100 UNIT/ML as directed. INJECT UP TO 168 UNITS DAILY AS DIRECTED 08/21/23  Yes [provider]  insulin  degludec (TRESIBA ) 200 UNIT/ML FlexTouch Pen Inject 30 Units into the skin daily. 08/21/23  Yes [provider]  lactulose , encephalopathy, (CHRONULAC ) 10 GM/15ML  SOLN Take 45 mLs by mouth 3 (three) times daily. 08/07/23 08/06/24 Yes [provider]  levocetirizine (XYZAL) 5 MG tablet Take 5 mg by mouth every evening.   Yes [provider]  levothyroxine  (SYNTHROID ) 50 MCG tablet Take 50 mcg by mouth. Take 1 tablet (50 mcg total) by mouth once daily 07/21/23  Yes [provider]  magnesium  oxide (MAG-OX) 400 MG tablet Take 400 mg by mouth 2 (two) times daily.    Yes [provider]  metolazone  (ZAROXOLYN ) 2.5 MG tablet Take 1 tablet (2.5 mg total) by mouth 2 (two) times a week. On Monday and Friday 09/11/23 12/10/23 Yes Furth, Cadence H, PA-C  potassium chloride  (KLOR-CON ) 20 MEQ packet Take 60 mEq by mouth daily. 1 packet with food Orally Once a day; Duration: 30 day(s) 09/05/23  Yes [place] A, FNP  rifaximin  (XIFAXAN ) 550 MG TABS tablet Take 550 mg by mouth. take 1 tablet (550 mg total) by mouth 2 (two) times daily for 120 days 07/23/23 11/20/23 Yes [provider]  simvastatin  (ZOCOR ) 20 MG tablet Take 20 mg by mouth at bedtime.   Yes [provider]  spironolactone  (ALDACTONE ) 50 MG tablet Take 1 tablet (50 mg total) by mouth daily. 08/03/23  Yes Lenon Marien CROME, MD  tirzepatide  (MOUNJARO ) 15 MG/0.5ML Pen Inject 15 mg into the skin once a week. On Fridays   Yes [provider]  torsemide  (DEMADEX ) 20 MG tablet Take 3 tablets (60 mg total) by mouth daily. Patient taking differently: Take 20 mg by mouth 3 (three) times daily. 06/26/23  Yes End, Lonni, MD  traMADol  (ULTRAM ) 50 MG tablet Take 1 tablet (50 mg total) by mouth every 8 (eight) hours as needed. 06/04/23  Yes Brahmanday, Govinda R, MD  valACYclovir  (VALTREX ) 500 MG tablet Take 500 mg by mouth 2 (two) times daily as needed. 02/10/23  Yes [provider]  levothyroxine  (SYNTHROID ) 75 MCG tablet Take 75 mcg by mouth daily. Patient not taking: Reported on 09/13/2023 08/07/23   [provider]   Allergies  Allergen  Reactions   Codeine Itching, Nausea And Vomiting and Other (See Comments)   Demeclocycline Rash   Demerol [Meperidine] Itching and Nausea And Vomiting   Hydrocodone  Itching and Nausea And Vomiting   Morphine  Nausea Only   Other Other (See Comments)   Oxycodone  Itching and Nausea And Vomiting   Pentazocine Itching, Nausea And Vomiting and Other (See Comments)   Tetracyclines & Related Rash   Coal Tar Extract Other (See Comments)   Hydrocodone -Acetaminophen  Other (See Comments)   Salicylic Acid Other (See Comments)   Tetracycline Hcl Other (See Comments)   Review of Systems  Musculoskeletal:        Pain in her bottom from sitting in bedside chair.    Physical Exam Pulmonary:     Effort: Pulmonary effort is normal.  Skin:    General: Skin is warm and dry.  Neurological:     Mental Status: She is alert.     Vital Signs: BP (!) 135/56 (BP Location: Left Arm)   Pulse (!) 59   Temp (!) 97.3 F (36.3 C)   Resp 16   Ht 5' 3 (1.6 m)   Wt 58.2 kg   LMP  (LMP Unknown)   SpO2 99%   BMI 22.73 kg/m  Pain Scale: 0-10   Pain Score: 0-No pain   SpO2: SpO2: 99 % O2 Device:SpO2: 99 % O2  Flow Rate: .   IO: Intake/output summary:  Intake/Output Summary (Last 24 hours) at 09/15/2023 1218 Last data filed at 09/15/2023 9163 Gross per 24 hour  Intake 240 ml  Output --  Net 240 ml    LBM: Last BM Date : 09/14/23 Baseline Weight: Weight: 58.6 kg Most recent weight: Weight: 58.2 kg       Signed by: Camelia Lewis, NP   Please contact Palliative Medicine Team phone at [phone] for questions and concerns.  For individual provider: See Tracey

## 2023-09-15 NOTE — Progress Notes (Signed)
 Progress Note    Annette Foster  FMW:[id number] DOB: 26-Jan-1941  DOA: 09/13/2023 PCP: Alla Amis, MD      Brief Narrative:    Medical records reviewed and are as summarized below:  Annette Foster is a 83 y.o. 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  She presented to the hospital because of altered mental status.  Ammonia was elevated (82) and altered mental status was attributed to acute hepatic encephalopathy.        Assessment/Plan:   Principal Problem:   Acute hepatic encephalopathy (HCC) Active Problems:   Decompensated nonalcoholic liver cirrhosis (HCC)   Chronic diastolic CHF (congestive heart failure) (HCC)   Paroxysmal atrial fibrillation (HCC)   Pressure injury of skin   Body mass index is 22.73 kg/m.   Acute hepatic encephalopathy, in the setting of NASH liver cirrhosis s/p TIPS on 06/09/2023: Mental status is better.  Continue rifaximin  and lactulose .  Continue nadolol , spironolactone  and torsemide .  Ammonia was 82 on admission.  Ammonia was normal at 24 2 weeks prior to admission. Neurontin  and tramadol  have been held.   Hypokalemia: Potassium is still low at 2.9.  Oral potassium pills were ordered but she requested that potassium chloride  be changed to IV.  She later requested that the IV potassium  chloride be changed to oral potassium chloride  packets.    Pancytopenia: Stable.  Likely due to liver cirrhosis.   Chronic diastolic CHF: Compensated.  2D echo in August 2024 showed EF estimated at 55 to 60%, grade 2 diastolic dysfunction.   Paroxysmal atrial fibrillation s/p AVN ablation and PPM: Not on any anticoagulation because of history of GI bleed and esophageal varices and recurrent falls   Comorbidities include OSA on CPAP, hyperlipidemia, chronic hypotension on midodrine , hypothyroidism   Patient said she wanted to speak with palliative care team.  She said she is tired and does not want to do this anymore.  We discussed CODE STATUS.  She wants to be DNR.  This was discussed with Mitzie, daughter, over the phone.  She is agreeable.     Diet Order             Diet Heart Fluid consistency: Thin  Diet effective now                            Consultants: Palliative care  Procedures: None    Medications:    enoxaparin  (LOVENOX ) injection  40 mg Subcutaneous Q24H   insulin  aspart  0-9 Units Subcutaneous Q4H   lactulose   30 g Oral QID   levothyroxine   50 mcg Oral Q0600   metolazone   2.5 mg Oral Once per day on Monday Thursday   pantoprazole   40 mg Oral Daily   potassium chloride   40 mEq Oral Q4H   rifaximin   550 mg Oral BID   torsemide   60 mg Oral Daily   Continuous Infusions:   Anti-infectives (  From admission, onward)    Start     Dose/Rate Route Frequency Ordered Stop   09/13/23 2200  rifaximin  (XIFAXAN ) tablet 550 mg        550 mg Oral 2 times daily 09/13/23 2045                Family Communication/Anticipated D/C date and plan/Code Status   DVT prophylaxis: enoxaparin  (LOVENOX ) injection 40 mg Start: 09/13/23 2200     Code Status: Full Code  Family Communication: Plan discussed with her daughter, Mitzie, over the phone Disposition Plan: Plan to discharge home   Status is: Inpatient Remains inpatient appropriate  because: Hepatic encephalopathy         Subjective:   Interval events noted.  Patient states she did not want to do this anymore and wanted to talk to palliative care.  She requested IV potassium because her potassium pills are too big for her to swallow.  No other complaints.  Mental status is better.  Objective:    Vitals:   09/15/23 0431 09/15/23 0500 09/15/23 0600 09/15/23 0819  BP: (!) 132/46   (!) 133/56  Pulse: 60   60  Resp: 18 15 15 14   Temp: 98.1 F (36.7 C)   98 F (36.7 C)  TempSrc: Axillary     SpO2: 97%   100%  Weight:   58.2 kg   Height:       No data found.  No intake or output data in the 24 hours ending 09/15/23 0911 Filed Weights   09/13/23 1710 09/15/23 0600  Weight: 58.6 kg 58.2 kg    Exam:  GEN: NAD SKIN: Warm and dry EYES: No pallor or icterus ENT: MMM CV: RRR PULM: CTA B ABD: soft, ND, NT, +BS CNS: AAO x 3, non focal EXT: No edema or tenderness PSYCH: Good insight and judgment.  Patient was emotional and tearful     Data Reviewed:   I have personally reviewed following labs and imaging studies:  Labs: Labs show the following:   Basic Metabolic Panel: Recent Labs  Lab 09/13/23 1715 09/14/23 0448 09/15/23 0538  NA 136 138 139  K 3.0* 2.9* 2.9*  CL 97* 105 106  CO2 25 24 24   GLUCOSE 105* 108* 128*  BUN 21 19 17   CREATININE 0.88 0.80 0.87  CALCIUM 8.9 8.6* 8.5*  MG 2.4 2.4  --   PHOS  --   --  4.0   GFR Estimated Creatinine Clearance: 41.2 mL/min (by C-G formula based on SCr of 0.87 mg/dL). Liver Function Tests: Recent Labs  Lab 09/13/23 1715 09/14/23 0448 09/15/23 0538  AST 49* 45*  --   ALT 17 17  --   ALKPHOS 66 59  --   BILITOT 2.6* 2.2*  --   PROT 6.9 6.3*  --   ALBUMIN  2.8* 2.5* 2.7*   Recent Labs  Lab 09/13/23 1715  LIPASE 37   Recent Labs  Lab 09/13/23 1715  AMMONIA 82*   Coagulation profile No results for input(s): INR, PROTIME in the last 168 hours.  CBC: Recent Labs  Lab  09/13/23 1715 09/14/23 0448 09/15/23 0538  WBC 4.8 3.5* 3.6*  NEUTROABS 2.0  --   --   HGB 11.9* 10.8* 11.3*  HCT 33.2* 30.2* 31.9*  MCV 90.0 89.6 89.6  PLT 112* 109* 107*   Cardiac Enzymes: No results for input(s): CKTOTAL, CKMB, CKMBINDEX, TROPONINI in the last 168 hours. BNP (last 3 results) No results for input(s): PROBNP in  the last 8760 hours. CBG: Recent Labs  Lab 09/14/23 1506 09/14/23 1947 09/15/23 0017 09/15/23 0429 09/15/23 0810  GLUCAP 128* 177* 114* 118* 131*   D-Dimer: No results for input(s): DDIMER in the last 72 hours. Hgb A1c: No results for input(s): HGBA1C in the last 72 hours. Lipid Profile: No results for input(s): CHOL, HDL, LDLCALC, TRIG, CHOLHDL, LDLDIRECT in the last 72 hours. Thyroid  function studies: No results for input(s): TSH, T4TOTAL, T3FREE, THYROIDAB in the last 72 hours.  Invalid input(s): FREET3 Anemia work up: No results for input(s): VITAMINB12, FOLATE, FERRITIN, TIBC, IRON , RETICCTPCT in the last 72 hours. Sepsis Labs: Recent Labs  Lab 09/13/23 1715 09/14/23 0448 09/15/23 0538  WBC 4.8 3.5* 3.6*    Microbiology No results found for this or any previous visit (from the past 240 hours).  Procedures and diagnostic studies:  CT Head Wo Contrast Result Date: 09/13/2023 CLINICAL DATA:  Altered level of consciousness EXAM: CT HEAD WITHOUT CONTRAST TECHNIQUE: Contiguous axial images were obtained from the base of the skull through the vertex without intravenous contrast. RADIATION DOSE REDUCTION: This exam was performed according to the departmental dose-optimization program which includes automated exposure control, adjustment of the mA and/or kV according to patient size and/or use of iterative reconstruction technique. COMPARISON:  08/27/2023 FINDINGS: Brain: No acute infarct or hemorrhage. Lateral ventricles and midline structures are unremarkable. No acute extra-axial fluid  collections. No mass effect. Vascular: No hyperdense vessel or unexpected calcification. Skull: Normal. Negative for fracture or focal lesion. Sinuses/Orbits: Mucosal thickening within the ethmoid and maxillary sinuses. Other: None. IMPRESSION: 1. No acute intracranial process. Electronically Signed   By: Ozell Daring M.D.   On: 09/13/2023 19:00   CT ABDOMEN PELVIS W CONTRAST Result Date: 09/13/2023 CLINICAL DATA:  Abdominal pain, acute, nonlocalized. EXAM: CT ABDOMEN AND PELVIS WITH CONTRAST TECHNIQUE: Multidetector CT imaging of the abdomen and pelvis was performed using the standard protocol following bolus administration of intravenous contrast. RADIATION DOSE REDUCTION: This exam was performed according to the departmental dose-optimization program which includes automated exposure control, adjustment of the mA and/or kV according to patient size and/or use of iterative reconstruction technique. CONTRAST:  OMNIPAQUE  IOHEXOL  300 MG/ML  SOLN COMPARISON:  08/01/2023. FINDINGS: Lower chest: The heart is enlarged and there is a trace pericardial effusion. Pacemaker leads are noted in the heart. There is a trace right pleural effusion. Interstitial prominence and a few airspace opacities are noted at the lung bases, improved from the previous exam. Hepatobiliary: The liver has a nodular contour, compatible with underlying cirrhosis. No focal abnormality is seen in the liver. The gallbladder is surgically absent. No biliary ductal dilatation. A tips shunt is noted. Pancreas: Unremarkable. No pancreatic ductal dilatation or surrounding inflammatory changes. Spleen: The spleen is enlarged.  No focal abnormality is seen. Adrenals/Urinary Tract: The adrenal glands are within normal limits. The kidneys enhance symmetrically. No renal calculus or hydronephrosis bilaterally. The bladder is unremarkable. Stomach/Bowel: The stomach is within normal limits. No bowel obstruction, free air, or pneumatosis is seen.  Appendix is not seen. Vascular/Lymphatic: Aortic atherosclerosis. No enlarged abdominal or pelvic lymph nodes. Reproductive: Status post hysterectomy. No adnexal masses. Other: No abdominopelvic ascites. Musculoskeletal: Degenerative changes are present in the thoracolumbar spine. No acute osseous abnormality. IMPRESSION: 1. No acute intra-abdominal process. 2. Morphologic changes of cirrhosis and portal hypertension. TIP shunt in place. 3. Interstitial prominence with a few scattered airspace opacities at the lung bases, improved from the prior exam. 4. Aortic atherosclerosis. Electronically Signed  By: Leita Birmingham M.D.   On: 09/13/2023 18:57   DG Chest Portable 1 View Result Date: 09/13/2023 EXAM: 1 VIEW XRAY OF THE CHEST 09/13/2023 05:19:53 PM COMPARISON: 08/27/2023 CLINICAL HISTORY: Weakness, AMS. Arrives via EMS from home d/t disorientation; Daughter reports minor confusion noted last night FINDINGS: LUNGS AND PLEURA: The coarse interstitial markings predominantly in the left lower lung are stable. No new infiltrate or overt edema. No pleural effusion. No pneumothorax. HEART AND MEDIASTINUM: No acute abnormality of the cardiac and mediastinal silhouettes. Atheromatous thoracic aorta. Stable right subclavian transvenous pacemaker. BONES AND SOFT TISSUES: No acute osseous abnormality. IMPRESSION: 1. No acute findings. 2. Stable coarse interstitial markings predominantly in the left lower lung. Electronically signed by: Dayne Hassell MD 09/13/2023 05:25 PM EDT RP Workstation: [id number]               LOS: 1 day   Kabao Leite  [facility]   Pager on [email or web address]. If 7PM-7AM, please contact night-coverage at [email or web address]     09/15/2023, 9:11 AM

## 2023-09-15 NOTE — Plan of Care (Signed)
  Problem: Education: Goal: Ability to describe self-care measures that may prevent or decrease complications (Diabetes Survival Skills Education) will improve Outcome: Progressing Goal: Individualized Educational Video(s) Outcome: Progressing   Problem: Coping: Goal: Ability to adjust to condition or change in health will improve Outcome: Progressing   Problem: Fluid Volume: Goal: Ability to maintain a balanced intake and output will improve Outcome: Progressing   Problem: Health Behavior/Discharge Planning: Goal: Ability to identify and utilize available resources and services will improve Outcome: Progressing Goal: Ability to manage health-related needs will improve Outcome: Progressing   Problem: Metabolic: Goal: Ability to maintain appropriate glucose levels will improve Outcome: Progressing   Problem: Nutritional: Goal: Maintenance of adequate nutrition will improve Outcome: Progressing Goal: Progress toward achieving an optimal weight will improve Outcome: Progressing   Problem: Skin Integrity: Goal: Risk for impaired skin integrity will decrease Outcome: Progressing   Problem: Tissue Perfusion: Goal: Adequacy of tissue perfusion will improve Outcome: Progressing   Problem: Education: Goal: Knowledge of General Education information will improve Description: Including pain rating scale, medication(s)/side effects and non-pharmacologic comfort measures Outcome: Progressing   Problem: Health Behavior/Discharge Planning: Goal: Ability to manage health-related needs will improve Outcome: Progressing   Problem: Clinical Measurements: Goal: Ability to maintain clinical measurements within normal limits will improve Outcome: Progressing Goal: Will remain free from infection Outcome: Progressing Goal: Diagnostic test results will improve Outcome: Progressing Goal: Respiratory complications will improve Outcome: Progressing Goal: Cardiovascular complication will  be avoided Outcome: Progressing   Problem: Activity: Goal: Risk for activity intolerance will decrease Outcome: Progressing   Problem: Nutrition: Goal: Adequate nutrition will be maintained Outcome: Progressing   Problem: Coping: Goal: Level of anxiety will decrease Outcome: Progressing   Problem: Elimination: Goal: Will not experience complications related to bowel motility Outcome: Progressing Goal: Will not experience complications related to urinary retention Outcome: Progressing   Problem: Pain Managment: Goal: General experience of comfort will improve and/or be controlled Outcome: Progressing   Problem: Safety: Goal: Ability to remain free from injury will improve Outcome: Progressing   Problem: Skin Integrity: Goal: Risk for impaired skin integrity will decrease Outcome: Progressing   Problem: Education: Goal: Ability to demonstrate management of disease process will improve Outcome: Progressing Goal: Ability to verbalize understanding of medication therapies will improve Outcome: Progressing Goal: Individualized Educational Video(s) Outcome: Progressing   Problem: Activity: Goal: Capacity to carry out activities will improve Outcome: Progressing   Problem: Cardiac: Goal: Ability to achieve and maintain adequate cardiopulmonary perfusion will improve Outcome: Progressing   Problem: Education: Goal: Knowledge of disease or condition will improve Outcome: Progressing Goal: Understanding of medication regimen will improve Outcome: Progressing Goal: Individualized Educational Video(s) Outcome: Progressing   Problem: Activity: Goal: Ability to tolerate increased activity will improve Outcome: Progressing   Problem: Cardiac: Goal: Ability to achieve and maintain adequate cardiopulmonary perfusion will improve Outcome: Progressing   Problem: Health Behavior/Discharge Planning: Goal: Ability to safely manage health-related needs after discharge will  improve Outcome: Progressing

## 2023-09-15 NOTE — Progress Notes (Addendum)
 Patient states she cannot tolerate the IV potassium infusion. Refused for the infusion to be kept going. IV patent with good blood return. Stopped infusion and informed attending [place] of situation

## 2023-09-16 DIAGNOSIS — K7682 Hepatic encephalopathy: Secondary | ICD-10-CM | POA: Diagnosis not present

## 2023-09-16 DIAGNOSIS — R252 Cramp and spasm: Secondary | ICD-10-CM | POA: Diagnosis not present

## 2023-09-16 DIAGNOSIS — R4589 Other symptoms and signs involving emotional state: Secondary | ICD-10-CM

## 2023-09-16 DIAGNOSIS — Z7189 Other specified counseling: Secondary | ICD-10-CM | POA: Diagnosis not present

## 2023-09-16 LAB — RENAL FUNCTION PANEL
Albumin: 3.2 g/dL — ABNORMAL LOW (ref 3.5–5.0)
Anion gap: 10 (ref 5–15)
BUN: 20 mg/dL (ref 8–23)
CO2: 26 mmol/L (ref 22–32)
Calcium: 9.4 mg/dL (ref 8.9–10.3)
Chloride: 99 mmol/L (ref 98–111)
Creatinine, Ser: 0.88 mg/dL (ref 0.44–1.00)
GFR, Estimated: >60 mL/min (ref >60)
Glucose, Bld: 161 mg/dL — ABNORMAL HIGH (ref 70–99)
Phosphorus: 4.6 mg/dL (ref 2.5–4.6)
Potassium: 3.2 mmol/L — ABNORMAL LOW (ref 3.5–5.1)
Sodium: 135 mmol/L (ref 135–145)

## 2023-09-16 LAB — GLUCOSE, CAPILLARY
Glucose-Capillary: 112 mg/dL — ABNORMAL HIGH (ref 70–99)
Glucose-Capillary: 141 mg/dL — ABNORMAL HIGH (ref 70–99)
Glucose-Capillary: 155 mg/dL — ABNORMAL HIGH (ref 70–99)
Glucose-Capillary: 155 mg/dL — ABNORMAL HIGH (ref 70–99)
Glucose-Capillary: 169 mg/dL — ABNORMAL HIGH (ref 70–99)
Glucose-Capillary: 172 mg/dL — ABNORMAL HIGH (ref 70–99)
Glucose-Capillary: 199 mg/dL — ABNORMAL HIGH (ref 70–99)

## 2023-09-16 LAB — AMMONIA: Ammonia: 71 umol/L — ABNORMAL HIGH (ref 9–35)

## 2023-09-16 LAB — POTASSIUM: Potassium: 3.2 mmol/L — ABNORMAL LOW (ref 3.5–5.1)

## 2023-09-16 MED ORDER — TORSEMIDE 20 MG PO TABS: 20.0000 mg | ORAL_TABLET | Freq: Three times a day (TID) | ORAL | Status: DC

## 2023-09-16 MED ORDER — LORAZEPAM 2 MG/ML IJ SOLN
0.5000 mg | Freq: Once | INTRAMUSCULAR | Status: AC
  Administered 2023-09-16: 0.5 mg via INTRAVENOUS
  Filled 2023-09-16: qty 1

## 2023-09-16 MED ORDER — POTASSIUM CHLORIDE 20 MEQ PO PACK
40.0000 meq | PACK | ORAL | Status: AC
  Administered 2023-09-16 (×2): 40 meq via ORAL
  Filled 2023-09-16 (×2): qty 2

## 2023-09-16 NOTE — Care Management Important Message (Signed)
 Important Message  Patient Details  Name: Annette Foster MRN: [id number] Date of Birth: [birth year]   Important Message Given:  Yes - Medicare IM     [name] 09/16/2023, 12:06 PM

## 2023-09-16 NOTE — Progress Notes (Signed)
 Made Dr. Jens aware that patient is drowsy since giving the ativan  for cramps/anxiety that was ordered by Palliative, and that daughter is concerned about getting her in the house with how drowsy she is and daughters husband is down in his back today with spasms and wont be able to help.  Also made MD aware of serum potassium result 3.2. MD acknowledged stating patient takes 60 meq of potassium daily at home. Dr. Jens stated that he would cancel discharge since patient is too drowsy. Patient arouses to voice and then falls back to sleep. Daughter at bedside.

## 2023-09-16 NOTE — Plan of Care (Signed)
  Problem: Metabolic: Goal: Ability to maintain appropriate glucose levels will improve Outcome: Progressing   Problem: Skin Integrity: Goal: Risk for impaired skin integrity will decrease Outcome: Progressing   Problem: Tissue Perfusion: Goal: Adequacy of tissue perfusion will improve Outcome: Progressing   Problem: Clinical Measurements: Goal: Cardiovascular complication will be avoided Outcome: Progressing   Problem: Skin Integrity: Goal: Risk for impaired skin integrity will decrease Outcome: Progressing   Problem: Cardiac: Goal: Ability to achieve and maintain adequate cardiopulmonary perfusion will improve Outcome: Progressing   Problem: Cardiac: Goal: Ability to achieve and maintain adequate cardiopulmonary perfusion will improve Outcome: Progressing

## 2023-09-16 NOTE — Plan of Care (Signed)
   Problem: Coping: Goal: Ability to adjust to condition or change in health will improve Outcome: Progressing   Problem: Tissue Perfusion: Goal: Adequacy of tissue perfusion will improve Outcome: Progressing

## 2023-09-16 NOTE — Progress Notes (Signed)
 Daily Progress Note   Patient Name: Annette Foster       Date: 09/16/2023 DOB: 03-10-1940  Age: 83 y.o. MRN#: [id number] Attending Physician: Jens Durand, MD Primary Care Physician: Alla Amis, MD Admit Date: 09/13/2023  Reason for Consultation/Follow-up: Establishing goals of care  Subjective: Notes reviewed.  TOC is working with patient and daughter on hospice at home.  Into see patient.  She appears anxious and is complaining of leg cramps.  Per nursing she has had Robaxin , without relief.  Ordered a dose of Ativan  to help with anxiety and cramps.  Length of Stay: 2  Current Medications: Scheduled Meds:   enoxaparin  (LOVENOX ) injection  40 mg Subcutaneous Q24H   insulin  aspart  0-9 Units Subcutaneous Q4H   lactulose   30 g Oral QID   levothyroxine   50 mcg Oral Q0600   metolazone   2.5 mg Oral Once per day on Monday Thursday   pantoprazole   40 mg Oral Daily   rifaximin   550 mg Oral BID   torsemide   60 mg Oral Daily    Continuous Infusions:  promethazine  (PHENERGAN ) injection (IM or IVPB) 12.5 mg (09/15/23 1240)    PRN Meds: acetaminophen  **OR** acetaminophen , albuterol , melatonin, methocarbamol , ondansetron  **OR** ondansetron  (ZOFRAN ) IV, mouth rinse, promethazine  (PHENERGAN ) injection (IM or IVPB)  Physical Exam Pulmonary:     Effort: Pulmonary effort is normal.  Neurological:     Mental Status: She is alert.             Vital Signs: BP (!) 162/66 (BP Location: Left Arm)   Pulse (!) 59   Temp 97.8 F (36.6 C) (Oral)   Resp 18   Ht 5' 3 (1.6 m)   Wt 58.2 kg   LMP  (LMP Unknown)   SpO2 100%   BMI 22.73 kg/m  SpO2: SpO2: 100 % O2 Device: O2 Device: Room Air O2 Flow Rate:    Intake/output summary:  Intake/Output Summary (Last 24 hours) at 09/16/2023  1542 Last data filed at 09/16/2023 1300 Gross per 24 hour  Intake 960 ml  Output --  Net 960 ml   LBM: Last BM Date : 09/16/23 Baseline Weight: Weight: 58.6 kg Most recent weight: Weight: 58.2 kg   Patient Active Problem List   Diagnosis Date Noted   Pressure injury of skin 09/14/2023  Weakness 08/28/2023   Acute on chronic diastolic CHF (congestive heart failure) (HCC) 08/27/2023   HLD (hyperlipidemia) 08/27/2023   Myocardial injury 08/27/2023   Constipation 08/03/2023   Intractable nausea and vomiting 08/02/2023   Intractable vomiting with nausea 08/01/2023   Hyperammonemia (HCC) 08/01/2023   Aspiration pneumonia (HCC) 06/28/2023   Chronic diastolic CHF (congestive heart failure) (HCC) 06/28/2023   Acute hepatic encephalopathy (HCC) 06/28/2023   Liver cirrhosis secondary to NASH (HCC) 06/05/2023   Hypotension 06/05/2023   Refractory ascites 05/30/2023   Hyperlipidemia, unspecified 05/30/2023   Diabetes mellitus without complication (HCC) 05/30/2023   Overweight (BMI 25.0-29.9) 05/30/2023   Anxiety 05/30/2023   Decompensated nonalcoholic liver cirrhosis (HCC) 05/21/2023   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 05/21/2023   OSA on CPAP 05/21/2023   IDA (iron  deficiency anemia) 05/21/2023   Abdominal pain 03/03/2023   Electrolyte abnormality 03/03/2023   ABLA (acute blood loss anemia) 03/02/2023   Non-alcoholic fatty liver disease 01/30/2023   Sepsis due to pneumonia (HCC) 01/29/2023   GERD without esophagitis 01/29/2023   Dyslipidemia 01/29/2023   Type 2 diabetes mellitus with complication, with long-term current use of insulin  (HCC) 01/29/2023   Hypokalemia 01/29/2023   Hyponatremia 01/29/2023   GI bleeding 01/28/2023   H/O atrioventricular nodal ablation 11/13/2022   Esophageal varices without bleeding (HCC) 10/17/2022   Pneumonia 09/11/2022   Adenomatous polyp of colon 09/10/2022   Rectal bleeding 09/08/2022   Leg pain 07/18/2022   Varicose  veins with inflammation 07/17/2022   Unspecified injury of head, initial encounter 09/14/2021   Rectal lesion    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) 08/22/2021   Persistent atrial fibrillation (HCC) 05/17/2021   S/P placement of cardiac pacemaker 03/23/2021   Portal hypertension (HCC)    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 bleeding (HCC)    Chronic heart failure with preserved ejection fraction (HFpEF) (HCC) 04/05/2020   Hepatic cirrhosis (HCC) 04/05/2020   Abnormal EKG 04/05/2020   Allergic rhinitis due to pollen 02/24/2020   Chronic allergic conjunctivitis 02/24/2020   Allergic rhinitis due to animal (cat) (dog) hair and dander 02/24/2020   Allergic rhinitis 02/24/2020   Polyp of transverse colon    Hematochezia 02/17/2020   Chronic anticoagulation 02/17/2020   Anasarca 02/17/2020   Symptomatic sinus bradycardia 02/17/2020   Paroxysmal atrial fibrillation (HCC) 02/17/2020   AKI (acute kidney injury) (HCC) 02/17/2020   History of colitis 02/17/2020   Acute GI bleeding    Atrial fibrillation with RVR (HCC) 02/07/2020   NASH (nonalcoholic steatohepatitis)    Hav (hallux abducto valgus), unspecified laterality 06/03/2019   Acquired bilateral hammer toes 06/03/2019   Iron  deficiency 04/26/2019   Right-sided chest wall pain 04/12/2019   Mid back pain on right side 04/12/2019   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 11/03/2017   Recurrent UTI (urinary tract infection) 07/23/2017   Nausea vomiting and diarrhea 01/11/2017   Hypothyroidism, unspecified 01/11/2017   Essential hypertension 01/11/2017   GERD (gastroesophageal reflux disease) 01/11/2017   Pancytopenia (HCC) 11/14/2016   Monilial vulvitis 11/06/2016   Spongiotic psoriasiform dermatitis 10/08/2016   Status post hysterectomy 06/20/2016   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   Vaginal atrophy 01/12/2016   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   In Pre-Arrest Conditions (Patient Is Breathing and Has A Pulse) Do not intubate. Provide all appropriate non-invasive medical interventions. Avoid ICU transfer unless indicated or required.   Consent: Discussion documented in EHR or advanced directives reviewed      09/15/23 0915           Code Status History     Date Active Date Inactive Code Status Order ID Comments User Context   09/13/2023 1942 09/15/2023 0915 Full Code [id number]  Cleatus Delayne GAILS, MD ED   08/27/2023 2242 08/28/2023 2121 Full Code [id number]  Hilma Rankins, MD ED   08/01/2023 1938 08/03/2023 1715 Full Code [id number]  Cleatus Delayne GAILS, MD ED   06/28/2023 2148 07/01/2023 2006 Full Code [id number]  Hilma Rankins, MD ED   06/04/2023 2126 06/18/2023 1437 Full Code [id number]  Cleatus Delayne GAILS, MD ED   05/30/2023 2134 05/31/2023 2211 Full Code [id number]  Hilma Rankins, MD ED   05/21/2023 1929 05/23/2023 1535 Full Code [id number]  Cleatus Delayne GAILS, MD ED   03/02/2023 2308 03/04/2023 1824 Full Code [id number]  Tobie Mario GAILS, MD ED   01/29/2023 0224 01/31/2023 1821 Full Code [id number]  Mansy, Madison LABOR, MD ED   01/28/2023 2258 01/29/2023 0224 Do not attempt resuscitation (DNR) PRE-ARREST INTERVENTIONS DESIRED [id number]  Mansy, Madison LABOR, MD ED   09/08/2022 2053 09/13/2022 1913 DNR [id number]  Leopold Damien NOVAK, MD ED   05/17/2021 1520 05/18/2021 1526 Full Code [id number]  Cindie Ole DASEN, MD Inpatient   03/23/2021 1655 03/24/2021 1702 Full Code [id number]  Cindie Ole DASEN, MD Inpatient   02/17/2020 0428 02/18/2020 2338 Full Code [id number]  Cleatus Delayne GAILS, MD ED    02/07/2020 1647 02/08/2020 2037 Full Code [id number]  Caleen Qualia, MD ED   09/03/2018 0921 09/03/2018 1404 Full Code [id number]  Kathlynn Sharper, MD Inpatient   01/11/2017 0212 01/12/2017 1817 Full Code [id number]  Jenel Lenis, MD Inpatient     Camelia Lewis, NP  Please contact Palliative Medicine Team phone at [phone] for questions and concerns.

## 2023-09-16 NOTE — Progress Notes (Signed)
 Progress Note    Tatym Schermer  FMW:[id number] DOB: May 27, 1940  DOA: 09/13/2023 PCP: Alla Amis, MD      Brief Narrative:    Medical records reviewed and are as summarized below:  Annette Foster is a 83 y.o. 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  She presented to the hospital because of altered mental status.  Ammonia was elevated (82) and altered mental status was attributed to acute hepatic encephalopathy.        Assessment/Plan:   Principal Problem:   Acute hepatic encephalopathy (HCC) Active Problems:   Decompensated nonalcoholic liver cirrhosis (HCC)   Chronic diastolic CHF (congestive heart failure) (HCC)   Paroxysmal atrial fibrillation (HCC)   Pressure injury of skin   Body mass index is 22.73 kg/m.   Acute hepatic encephalopathy, in the setting of NASH liver cirrhosis s/p TIPS on 06/09/2023: Mental status is better.  Continue rifaximin  and lactulose .  Continue nadolol , spironolactone  and torsemide . Repeat ammonia level is pending (patient wants to know her ammonia level prior to discharge.  Ammonia was 82 on admission.  Ammonia was normal at 24 2 weeks prior to admission. Neurontin  and tramadol  have been held.   Hypokalemia: Potassium up to 3.2.  Continue potassium repletion.  Potassium tends to run  low and she takes 60 mEq of potassium chloride  daily at home.   Pancytopenia: Stable.  Likely due to liver cirrhosis.   Chronic diastolic CHF: Compensated.  2D echo in August 2024 showed EF estimated at 55 to 60%, grade 2 diastolic dysfunction.   Paroxysmal atrial fibrillation s/p AVN ablation and PPM: Not on any anticoagulation because of history of GI bleed and esophageal varices and recurrent falls   Comorbidities include OSA on CPAP, hyperlipidemia, chronic hypotension on midodrine , hypothyroidism   Patient has decided to go home with hospice.  Earlier today, she was given Ativan  for severe leg cramps.  This made her drowsy later in the day.  Her daughter was concerned that she would not be able to get patient into the house because her husband is down with back spasms. Daughter is also making arrangements with hospice liaison to set up hospice at home. Discharge order was placed earlier with plan to discharge home today.  However, discharge order will be canceled because of the aforementioned reasons.  Plan to discharge home tomorrow. Plan discussed with Zane, RN and Sarah, Child psychotherapist.    Diet Order             Diet Heart Fluid consistency: Thin  Diet effective now           Diet - low sodium heart healthy                            Consultants: Palliative care  Procedures: None    Medications:    enoxaparin  (LOVENOX ) injection  40 mg Subcutaneous Q24H   insulin  aspart  0-9  Units Subcutaneous Q4H   lactulose   30 g Oral QID   levothyroxine   50 mcg Oral Q0600   metolazone   2.5 mg Oral Once per day on Monday Thursday   pantoprazole   40 mg Oral Daily   rifaximin   550 mg Oral BID   torsemide   60 mg Oral Daily   Continuous Infusions:  promethazine  (PHENERGAN ) injection (IM or IVPB) Stopped (09/15/23 1300)     Anti-infectives (From admission, onward)    Start     Dose/Rate Route Frequency Ordered Stop   09/13/23 2200  rifaximin  (XIFAXAN )  tablet 550 mg        550 mg Oral 2 times daily 09/13/23 2045                Family Communication/Anticipated D/C date and plan/Code Status   DVT prophylaxis: enoxaparin  (LOVENOX ) injection 40 mg Start: 09/13/23 2200     Code Status: Limited: Do not attempt resuscitation (DNR) -DNR-LIMITED -Do Not Intubate/DNI   Family Communication: Plan discussed with her daughter, Mitzie, over the phone Disposition Plan: Plan to discharge home   Status is: Inpatient Remains inpatient appropriate because: Hepatic encephalopathy         Subjective:   Interval events noted.  She has no complaints..  She feels better.  She said she wants to go home today but she wants to know what her ammonia level is prior to discharge.  Objective:    Vitals:   09/16/23 0401 09/16/23 0828 09/16/23 1215 09/16/23 1640  BP: (!) 152/57 137/64 (!) 162/66 (!) 145/66  Pulse: (!) 59 (!) 59 (!) 59 60  Resp: 20 17 18 19   Temp: 97.7 F (36.5 C) 97.8 F (36.6 C) 97.8 F (36.6 C)   TempSrc:  Axillary Oral   SpO2: 98% 98% 100% 98%  Weight:      Height:       No data found.   Intake/Output Summary (Last 24 hours) at 09/16/2023 1648 Last data filed at 09/16/2023 1600 Gross per 24 hour  Intake 1010 ml  Output --  Net 1010 ml   Filed Weights   09/13/23 1710 09/15/23 0600  Weight: 58.6 kg 58.2 kg    Exam:  GEN: NAD SKIN: Warm and dry EYES: No pallor or icterus ENT: MMM CV: RRR PULM: CTA B ABD: soft, ND, NT, +BS CNS: AAO x 3, non focal EXT: No edema or tenderness        Data Reviewed:   I have personally reviewed following labs and imaging studies:  Labs: Labs show the following:   Basic Metabolic Panel: Recent Labs  Lab 09/13/23 1715 09/14/23 0448 09/15/23 0538 09/16/23 0450 09/16/23 1534  NA 136 138 139 135  --   K 3.0* 2.9* 2.9* 3.2* 3.2*  CL 97* 105 106 99  --   CO2 25 24 24 26   --   GLUCOSE 105* 108* 128* 161*  --   BUN 21 19 17 20   --   CREATININE 0.88 0.80 0.87  0.88  --   CALCIUM 8.9 8.6* 8.5* 9.4  --   MG 2.4 2.4  --   --   --   PHOS  --   --  4.0 4.6  --    GFR Estimated Creatinine Clearance: 40.8 mL/min (by C-G formula based on SCr of 0.88 mg/dL). Liver Function Tests: Recent Labs  Lab 09/13/23 1715 09/14/23 0448 09/15/23 0538 09/16/23 0450  AST 49* 45*  --   --   ALT 17 17  --   --  ALKPHOS 66 59  --   --   BILITOT 2.6* 2.2*  --   --   PROT 6.9 6.3*  --   --   ALBUMIN  2.8* 2.5* 2.7* 3.2*   Recent Labs  Lab 09/13/23 1715  LIPASE 37   Recent Labs  Lab 09/13/23 1715 09/16/23 1534  AMMONIA 82* 71*   Coagulation profile No results for input(s): INR, PROTIME in the last 168 hours.  CBC: Recent Labs  Lab 09/13/23 1715 09/14/23 0448 09/15/23 0538  WBC 4.8 3.5* 3.6*  NEUTROABS 2.0  --   --   HGB 11.9* 10.8* 11.3*  HCT 33.2* 30.2* 31.9*  MCV 90.0 89.6 89.6  PLT 112* 109* 107*   Cardiac Enzymes: No results for input(s): CKTOTAL, CKMB, CKMBINDEX, TROPONINI in the last 168 hours. BNP (last 3 results) No results for input(s): PROBNP in the last 8760 hours. CBG: Recent Labs  Lab 09/16/23 0020 09/16/23 0403 09/16/23 0829 09/16/23 1216 09/16/23 1634  GLUCAP 155* 155* 141* 199* 112*   D-Dimer: No results for input(s): DDIMER in the last 72 hours. Hgb A1c: Recent Labs    09/13/23 2130  HGBA1C 4.3*   Lipid Profile: No results for input(s): CHOL, HDL, LDLCALC, TRIG, CHOLHDL, LDLDIRECT in the last 72 hours. Thyroid  function studies: No results for input(s): TSH, T4TOTAL, T3FREE, THYROIDAB in the last 72 hours.  Invalid input(s): FREET3 Anemia work up: No results for input(s): VITAMINB12, FOLATE, FERRITIN, TIBC, IRON , RETICCTPCT in the last 72 hours. Sepsis Labs: Recent Labs  Lab 09/13/23 1715 09/14/23 0448 09/15/23 0538  WBC 4.8 3.5* 3.6*    Microbiology No results found for this or any previous visit (from the past 240 hours).  Procedures and  diagnostic studies:  No results found.              LOS: 2 days   Lekeisha Arenas  [facility]   Pager on [email or web address]. If 7PM-7AM, please contact night-coverage at [email or web address]     09/16/2023, 4:48 PM

## 2023-09-16 NOTE — TOC Progression Note (Addendum)
 Transition of Care [facility]) - Progression Note    Patient Details  Name: Annette Foster MRN: [id number] Date of Birth: [birth year]  [facility] CM/SW Contact  Lauraine JAYSON Carpen, LCSW Phone Number: 09/16/2023, 10:22 AM  Clinical Narrative:  No family at bedside. Called daughter. She has not had time to review the home hospice agency list yet.   4:44 pm: Met with daughter. Daughter with concerns about patient passing away at home. Discussed potential need for hospice home in the future. Made home hospice referral to [facility] with [facility].  Expected Discharge Plan: Home w Hospice Care Barriers to Discharge: Continued Medical Work up               Expected Discharge Plan and Services     Post Acute Care Choice: Hospice Living arrangements for the past 2 months: Single Family Home                                       Social Drivers of Health (SDOH) Interventions SDOH Screenings   Food Insecurity: No Food Insecurity (09/14/2023)  Housing: Low Risk  (09/14/2023)  Transportation Needs: No Transportation Needs (09/14/2023)  Utilities: Not At Risk (09/14/2023)  Financial Resource Strain: Medium Risk (07/17/2023)   Received from [facility]  Physical Activity: Unknown (01/30/2018)   [facility]  Social Connections: Moderately Isolated (09/14/2023)  Tobacco Use: Low Risk  (09/13/2023)    Readmission Risk Interventions     No data to display

## 2023-09-17 ENCOUNTER — Other Ambulatory Visit: Payer: Self-pay

## 2023-09-17 DIAGNOSIS — K7682 Hepatic encephalopathy: Secondary | ICD-10-CM | POA: Diagnosis not present

## 2023-09-17 DIAGNOSIS — I5032 Chronic diastolic (congestive) heart failure: Secondary | ICD-10-CM | POA: Diagnosis not present

## 2023-09-17 DIAGNOSIS — I480 Paroxysmal atrial fibrillation: Secondary | ICD-10-CM | POA: Diagnosis not present

## 2023-09-17 DIAGNOSIS — K7290 Hepatic failure, unspecified without coma: Secondary | ICD-10-CM

## 2023-09-17 DIAGNOSIS — K7460 Unspecified cirrhosis of liver: Secondary | ICD-10-CM

## 2023-09-17 DIAGNOSIS — Z7189 Other specified counseling: Secondary | ICD-10-CM | POA: Diagnosis not present

## 2023-09-17 LAB — GLUCOSE, CAPILLARY
Glucose-Capillary: 127 mg/dL — ABNORMAL HIGH (ref 70–99)
Glucose-Capillary: 130 mg/dL — ABNORMAL HIGH (ref 70–99)
Glucose-Capillary: 131 mg/dL — ABNORMAL HIGH (ref 70–99)
Glucose-Capillary: 136 mg/dL — ABNORMAL HIGH (ref 70–99)
Glucose-Capillary: 155 mg/dL — ABNORMAL HIGH (ref 70–99)

## 2023-09-17 MED ORDER — TRAMADOL HCL 50 MG PO TABS: 50.0000 mg | ORAL_TABLET | Freq: Four times a day (QID) | ORAL | Status: DC | PRN

## 2023-09-17 MED ORDER — DIAZEPAM 2 MG PO TABS
2.0000 mg | ORAL_TABLET | Freq: Four times a day (QID) | ORAL | 0 refills | Status: DC | PRN
  Filled 2023-09-17: qty 30, 9d supply, fill #0

## 2023-09-17 MED ORDER — CYCLOBENZAPRINE HCL 5 MG PO TABS
5.0000 mg | ORAL_TABLET | Freq: Three times a day (TID) | ORAL | 0 refills | Status: DC | PRN
  Filled 2023-09-17: qty 30, 10d supply, fill #0

## 2023-09-17 MED ORDER — DIAZEPAM 2 MG PO TABS: 2.0000 mg | ORAL_TABLET | Freq: Four times a day (QID) | ORAL | Status: DC | PRN

## 2023-09-17 MED ORDER — CYCLOBENZAPRINE HCL 10 MG PO TABS: 5.0000 mg | ORAL_TABLET | Freq: Three times a day (TID) | ORAL | Status: DC | PRN

## 2023-09-17 NOTE — Progress Notes (Signed)
 Daily Progress Note   Patient Name: Annette Foster       Date: 09/17/2023 DOB: 1940-07-21  Age: 83 y.o. MRN#: [id number] Attending Physician: Caleen Qualia, MD Primary Care Physician: Alla Amis, MD Admit Date: 09/13/2023  Reason for Consultation/Follow-up: Establishing goals of care  Subjective: Notes reviewed.  Communicated with care team including attending regarding plans moving forward.  In to see patient.  She is currently walking from bedside toilet to her bed with nursing staff.  She discusses anxiety with wanting to know where she will go from here and what options she has.  She inquires about going to the hospice home as she feels going home with her daughter to her house would not be the best plan.    She discusses that she is having some pain, and has used tramadol  in the past which has worked well for her; discussed reordering.  She states that Ativan  yesterday helped with her anxiety and muscle spasms.  Per notes, patient was sleepy with receiving Ativan .  Valium  2 mg every 6 hours PRN ordered for anxiety and for refractory muscle spasms.  Robaxin  changed to Flexeril .   Length of Stay: 3  Current Medications: Scheduled Meds:   enoxaparin  (LOVENOX ) injection  40 mg Subcutaneous Q24H   insulin  aspart  0-9 Units Subcutaneous Q4H   lactulose   30 g Oral QID   levothyroxine   50 mcg Oral Q0600   metolazone   2.5 mg Oral Once per day on Monday Thursday   pantoprazole   40 mg Oral Daily   rifaximin   550 mg Oral BID   torsemide   60 mg Oral Daily    Continuous Infusions:  promethazine  (PHENERGAN ) injection (IM or IVPB) Stopped (09/15/23 1300)    PRN Meds: acetaminophen  **OR** acetaminophen , albuterol , cyclobenzaprine , diazepam , melatonin, ondansetron  **OR** ondansetron   (ZOFRAN ) IV, mouth rinse, promethazine  (PHENERGAN ) injection (IM or IVPB), traMADol   Physical Exam Pulmonary:     Effort: Pulmonary effort is normal.  Skin:    General: Skin is warm and dry.  Neurological:     Mental Status: She is alert.             Vital Signs: BP 91/69 (BP Location: Right Arm)   Pulse (!) 59   Temp 97.7 F (36.5 C) (Oral)   Resp 14   Ht 5' 3 (1.6  m)   Wt 58.2 kg   LMP  (LMP Unknown)   SpO2 100%   BMI 22.73 kg/m  SpO2: SpO2: 100 % O2 Device: O2 Device: Nasal Cannula O2 Flow Rate: O2 Flow Rate (L/min): 2 L/min  Intake/output summary:  Intake/Output Summary (Last 24 hours) at 09/17/2023 1203 Last data filed at 09/17/2023 0900 Gross per 24 hour  Intake 650 ml  Output --  Net 650 ml   LBM: Last BM Date : 09/16/23 Baseline Weight: Weight: 58.6 kg Most recent weight: Weight: 58.2 kg   Patient Active Problem List   Diagnosis Date Noted   Pressure injury of skin 09/14/2023   Weakness 08/28/2023   Acute on chronic diastolic CHF (congestive heart failure) (HCC) 08/27/2023   HLD (hyperlipidemia) 08/27/2023   Myocardial injury 08/27/2023   Constipation 08/03/2023   Intractable nausea and vomiting 08/02/2023   Intractable vomiting with nausea 08/01/2023   Hyperammonemia (HCC) 08/01/2023   Aspiration pneumonia (HCC) 06/28/2023   Chronic diastolic CHF (congestive heart failure) (HCC) 06/28/2023   Acute hepatic encephalopathy (HCC) 06/28/2023   Liver cirrhosis secondary to NASH (HCC) 06/05/2023   Hypotension 06/05/2023   Refractory ascites 05/30/2023   Hyperlipidemia, unspecified 05/30/2023   Diabetes mellitus without complication (HCC) 05/30/2023   Overweight (BMI 25.0-29.9) 05/30/2023   Anxiety 05/30/2023   Decompensated nonalcoholic liver cirrhosis (HCC) 05/21/2023   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 05/21/2023   OSA on CPAP 05/21/2023   IDA (iron  deficiency anemia) 05/21/2023   Abdominal pain 03/03/2023   Electrolyte  abnormality 03/03/2023   ABLA (acute blood loss anemia) 03/02/2023   Non-alcoholic fatty liver disease 01/30/2023   Sepsis due to pneumonia (HCC) 01/29/2023   GERD without esophagitis 01/29/2023   Dyslipidemia 01/29/2023   Type 2 diabetes mellitus with complication, with long-term current use of insulin  (HCC) 01/29/2023   Hypokalemia 01/29/2023   Hyponatremia 01/29/2023   GI bleeding 01/28/2023   H/O atrioventricular nodal ablation 11/13/2022   Esophageal varices without bleeding (HCC) 10/17/2022   Pneumonia 09/11/2022   Adenomatous polyp of colon 09/10/2022   Rectal bleeding 09/08/2022   Leg pain 07/18/2022   Varicose veins with inflammation 07/17/2022   Unspecified injury of head, initial encounter 09/14/2021   Rectal lesion    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) 08/22/2021   Persistent atrial fibrillation (HCC) 05/17/2021   S/P placement of cardiac pacemaker 03/23/2021   Portal hypertension (HCC)    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 bleeding (HCC)    Chronic heart failure with preserved ejection fraction (HFpEF) (HCC) 04/05/2020   Hepatic cirrhosis (HCC) 04/05/2020   Abnormal EKG 04/05/2020   Allergic rhinitis due to pollen 02/24/2020   Chronic allergic conjunctivitis 02/24/2020   Allergic rhinitis due to animal (cat) (dog) hair and dander 02/24/2020   Allergic rhinitis 02/24/2020   Polyp of transverse colon    Hematochezia 02/17/2020   Chronic anticoagulation 02/17/2020   Anasarca 02/17/2020   Symptomatic sinus bradycardia 02/17/2020   Paroxysmal atrial fibrillation (HCC) 02/17/2020   AKI (acute kidney injury) (HCC) 02/17/2020   History of colitis 02/17/2020   Acute GI bleeding    Atrial fibrillation with RVR (HCC) 02/07/2020   NASH (nonalcoholic steatohepatitis)    Hav (hallux abducto valgus), unspecified laterality 06/03/2019   Acquired bilateral hammer toes 06/03/2019   Iron  deficiency 04/26/2019   Right-sided chest wall pain 04/12/2019   Mid back  pain on right side 04/12/2019   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 11/03/2017   Recurrent UTI (urinary tract infection)  07/23/2017   Nausea vomiting and diarrhea 01/11/2017   Hypothyroidism, unspecified 01/11/2017   Essential hypertension 01/11/2017   GERD (gastroesophageal reflux disease) 01/11/2017   Pancytopenia (HCC) 11/14/2016   Monilial vulvitis 11/06/2016   Spongiotic psoriasiform dermatitis 10/08/2016   Status post hysterectomy 06/20/2016   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  Code Status:    Code Status Orders  (From admission, onward)           Start     Ordered   09/15/23 0916  Do not attempt resuscitation (DNR)- Limited -Do Not Intubate (DNI)  (Code Status)  Continuous       Question Answer Comment  If pulseless and not breathing No CPR or chest compressions.   In Pre-Arrest Conditions (Patient Is Breathing and Has A Pulse) Do not intubate. Provide all appropriate non-invasive medical interventions. Avoid ICU transfer unless indicated or required.   Consent: Discussion documented in EHR or advanced directives reviewed      09/15/23 0915           Code Status History     Date Active Date Inactive Code Status Order ID Comments User Context   09/13/2023 1942 09/15/2023 0915 Full Code [id number]  Cleatus Delayne GAILS, MD ED   08/27/2023 2242 08/28/2023 2121 Full Code [id number]  Hilma Rankins, MD ED   08/01/2023 1938 08/03/2023 1715 Full Code [id number]  Cleatus Delayne GAILS, MD ED   06/28/2023 2148 07/01/2023 2006 Full Code [id number]  Hilma Rankins, MD ED   06/04/2023 2126 06/18/2023 1437 Full Code [id number]  Cleatus Delayne GAILS, MD ED   05/30/2023 2134 05/31/2023 2211 Full Code [id number]  Hilma Rankins, MD ED   05/21/2023 1929 05/23/2023 1535 Full Code [id number]   Cleatus Delayne GAILS, MD ED   03/02/2023 2308 03/04/2023 1824 Full Code [id number]  Tobie Mario GAILS, MD ED   01/29/2023 0224 01/31/2023 1821 Full Code [id number]  Mansy, Madison LABOR, MD ED   01/28/2023 2258 01/29/2023 0224 Do not attempt resuscitation (DNR) PRE-ARREST INTERVENTIONS DESIRED [id number]  Mansy, Madison LABOR, MD ED   09/08/2022 2053 09/13/2022 1913 DNR [id number]  Leopold Damien NOVAK, MD ED   05/17/2021 1520 05/18/2021 1526 Full Code [id number]  Cindie Ole DASEN, MD Inpatient   03/23/2021 1655 03/24/2021 1702 Full Code [id number]  Cindie Ole DASEN, MD Inpatient   02/17/2020 0428 02/18/2020 2338 Full Code [id number]  Cleatus Delayne GAILS, MD ED   02/07/2020 1647 02/08/2020 2037 Full Code [id number]  Caleen Qualia, MD ED   09/03/2018 0921 09/03/2018 1404 Full Code [id number]  Kathlynn Sharper, MD Inpatient   01/11/2017 0212 01/12/2017 1817 Full Code [id number]  Jenel Lenis, MD Inpatient       Thank you for allowing the Palliative Medicine Team to assist in the care of this patient.     Camelia Lewis, NP  Please contact Palliative Medicine Team phone at [phone] for questions and concerns.

## 2023-09-17 NOTE — Progress Notes (Signed)
 [facility] Liasion Note  Received a call from patient's daughter today who stated that she is going to talk with patient about going to her own home instead of going to her home.  She said that she willl contact [facility] after speaking with patient.  Hospital team aware.    Please call with any Hospice related questions or concerns.  Thank you for the opportunity to participate in this patient's care.  [facility] Liaison [phone]

## 2023-09-17 NOTE — Plan of Care (Signed)
  Problem: Pain Managment: Goal: General experience of comfort will improve and/or be controlled Outcome: Progressing   Problem: Safety: Goal: Ability to remain free from injury will improve Outcome: Progressing

## 2023-09-17 NOTE — Progress Notes (Signed)
 [facility] [address] [facility] Liaison note  Referral received yesterday afternoon from [facility], Lauraine Carpen, LCSW, for hospice services at the daughter's home.  Daughter reported yesterday evening that she had to take her husband to the ED, so she will call [facility] back today.    Please call with any hospice related questions or concerns  Thank you for the opportunity to participate in this patient's care  [facility] Liaison [phone]

## 2023-09-17 NOTE — Plan of Care (Signed)
 IV removed, Rx delivered to room, discharge instructions reviewed and patient discharged to home with daughter

## 2023-09-17 NOTE — Progress Notes (Signed)
   09/17/23 1000  Spiritual Encounters  Type of Visit Initial  Care provided to: Patient  Referral source Non-clinical staff  Reason for visit Routine spiritual support (Pt has been placed on hospice)  OnCall Visit No  Spiritual Framework  Presenting Themes Meaning/purpose/sources of inspiration;Impactful experiences and emotions  Interventions  Spiritual Care Interventions Made Established relationship of care and support;Compassionate presence;Reflective listening;Normalization of emotions;Prayer;Encouragement  Intervention Outcomes  Outcomes Connection to spiritual care;Awareness around self/spiritual resourses  Spiritual Care Plan  Spiritual Care Issues Still Outstanding Chaplain will continue to follow   Pt was referred by Neville (nurse secretary) for chaplain to come in and spend a little time with her. Chaplain spent time listening to pt talk about her family and the love she has for Jesus and how she feels the [name] is calling her home and should she hear that trumpet it's ok to go. She smiled and said I'm ready! Chaplain asked if she could sing It Is Well instead of praying and pt said that would be lovely!

## 2023-09-17 NOTE — Progress Notes (Signed)
  [facility] Liasion Note   Met with patient and her daughter this afternoon.  They had questions about Hospice Home criteria.  Explained the criteria for admission and why patient is not currently appropriate for admission.  Gave patient and daughter an overview of hospice services at home.  Patient will dc home via daughter's car this evening.  Patient has a walker and bsc.  No other DME needed at this time, according to daughter.  Referral submitted. Hospital team aware.   Please call with any hospice related questions or concerns  Thank you for the opportunity to participate in this patient's care.  [facility] Liaison [phone]

## 2023-09-17 NOTE — Discharge Summary (Signed)
 Physician Discharge Summary   Patient: Annette Foster MRN: [id number] DOB: 1940/11/17  Admit date:     09/13/2023  Discharge date: 09/17/23  Discharge Physician: Amaryllis Dare   PCP: Alla Amis, MD   Recommendations at discharge:  Follow-up with primary care provider Patient is being discharged home with hospice.  Discharge Diagnoses: Principal Problem:   Acute hepatic encephalopathy (HCC) Active Problems:   Decompensated nonalcoholic liver cirrhosis (HCC)   Chronic diastolic CHF (congestive heart failure) (HCC)   Paroxysmal atrial fibrillation (HCC)   Pressure injury of skin   Hospital Course: Annette Foster is a 84 y.o. 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  She presented to the hospital because of altered mental status.   Patient was found to have elevated ammonia level at 82 and altered mental status was attributed to acute hepatic encephalopathy.  Mental status slowly improved.  Patient recurrent episodes of hepatic encephalopathy and other complications related to hepatic cirrhosis-palliative care was consulted and patient decided to go home with hospice.  Patient will continue her home medications including rifaximin  or lactulose  on discharge.  Patient was found to have stable pancytopenia likely secondary to liver cirrhosis.   Did develop mild electrolyte abnormalities, for example hypokalemia which was repleted before discharge.  Patient has an history of paroxysmal atrial fibrillation s/p AVN ablation and PPM in place.  She is not on any anticoagulation because of the history of GI bleed and esophageal varices along with recurrent falls.  Patient will continue on current medications and hospice will follow-up at home for further assistance.      Pain control - [place]  Controlled Substance Reporting System database was reviewed. and patient was instructed, not to drive, operate heavy machinery, perform activities at heights, swimming or participation in water activities or provide baby-sitting services while on Pain, Sleep and Anxiety Medications; until their outpatient Physician has advised to do so again. Also recommended to not to take more than prescribed Pain, Sleep and Anxiety Medications.  Consultants: Palliative care Procedures performed: None Disposition: Hospice care Diet recommendation:  Discharge Diet Orders (From admission, onward)     Start     Ordered   09/17/23 0000  Diet - low sodium heart healthy        09/17/23 1516   09/16/23 0000  Diet - low sodium heart healthy        09/16/23 1624           Cardiac diet DISCHARGE MEDICATION: Allergies as of 09/17/2023       Reactions   Codeine Itching, Nausea And Vomiting, Other (See Comments)   Demeclocycline Rash   Demerol [meperidine] Itching, Nausea And Vomiting   Hydrocodone  Itching, Nausea And Vomiting   Morphine  Nausea Only   Other Other (See Comments)   Oxycodone  Itching, Nausea And Vomiting   Pentazocine Itching, Nausea And Vomiting, Other (See Comments)   Tetracyclines & Related Rash   Coal Tar Extract Other (See Comments)   Hydrocodone -acetaminophen   Other (See Comments)   Salicylic Acid Other (See Comments)   Tetracycline Hcl Other (See Comments)        Medication List     STOP taking these medications     Mounjaro  15 MG/0.5ML Pen Generic drug: tirzepatide        TAKE these medications    clobetasol  ointment 0.05 % Commonly known as: TEMOVATE  Apply 1 application topically as needed (vaginal irritation).   clotrimazole  1 % cream Commonly known as: LOTRIMIN  Apply 1 Application topically 2 (two) times daily as needed.   cyanocobalamin 1000 MCG/ML injection Commonly known as: VITAMIN B12 1,000 mcg every 30 (thirty) days.   cyclobenzaprine  5 MG tablet Commonly known as: FLEXERIL  Take 1 tablet (5 mg total) by mouth 3 (three) times daily as needed for muscle spasms.   diazepam  2 MG tablet Commonly known as: VALIUM  Take 1 tablet (2 mg total) by mouth every 6 (six) hours as needed for anxiety (Anxiety and muscle spasms that are not corrected with Flexeril ).   EPINEPHrine  0.3 mg/0.3 mL Soaj injection Commonly known as: EPI-PEN Inject 0.3 mg into the muscle.   esomeprazole 40 MG capsule Commonly known as: NEXIUM Take 40 mg by mouth 2 (two) times daily before a meal.   fenofibrate  160 MG tablet Take 160 mg by mouth at bedtime.   gabapentin  300 MG capsule Commonly known as: NEURONTIN  Take 300 mg by mouth at bedtime.   Insulin  Aspart FlexPen 100 UNIT/ML Commonly known as: NOVOLOG  as directed. INJECT UP TO 168 UNITS DAILY AS DIRECTED   insulin  degludec 200 UNIT/ML FlexTouch Pen Commonly known as: TRESIBA  Inject 30 Units into the skin daily.   lactulose  (encephalopathy) 10 GM/15ML Soln Commonly known as: CHRONULAC  Take 45 mLs by mouth 3 (three) times daily.   levocetirizine 5 MG tablet Commonly known as: XYZAL Take 5 mg by mouth every evening.   levothyroxine  50 MCG tablet Commonly known as: SYNTHROID  Take 50 mcg by mouth. Take 1 tablet (50 mcg total) by mouth once daily What changed: Another medication with the same name was removed. Continue taking this medication, and follow the directions you see here.   magnesium  oxide 400 MG tablet Commonly known as:  MAG-OX Take 400 mg by mouth 2 (two) times daily.   metolazone  2.5 MG tablet Commonly known as: ZAROXOLYN  Take 1 tablet (2.5 mg total) by mouth 2 (two) times a week. On Monday and Friday   potassium chloride  20 MEQ packet Commonly known as: KLOR-CON  Take 60 mEq by mouth daily. 1 packet with food Orally Once a day; Duration: 30 day(s)   rifaximin  550 MG Tabs tablet Commonly known as: XIFAXAN  Take 550 mg by mouth. take 1 tablet (550 mg total) by mouth 2 (two) times daily for 120 days   simvastatin  20 MG tablet Commonly known as: ZOCOR  Take 20 mg by mouth at bedtime.   spironolactone  50 MG tablet Commonly known as: ALDACTONE  Take 1 tablet (50 mg total) by mouth daily.   torsemide  20 MG tablet Commonly known as: DEMADEX  Take 1 tablet (20 mg total) by mouth 3 (three) times daily.   traMADol  50 MG tablet Commonly known as: ULTRAM  Take 1 tablet (50 mg total) by mouth every 8 (eight) hours as needed.   valACYclovir  500 MG tablet Commonly known as: VALTREX  Take 500 mg by mouth 2 (two) times daily as needed.   Vitamin D  50 MCG (2000 UT) Caps Take 2,000 Units by mouth daily.        Follow-up Information  Alla Amis, MD. Schedule an appointment as soon as possible for a visit.   Specialty: Family Medicine Contact information: [address] [facility] [place] [address] [phone]                Discharge Exam: Annette Foster   09/13/23 1710 09/15/23 0600  Weight: 58.6 kg 58.2 kg   General.  Frail elderly lady, in no acute distress. Pulmonary.  Lungs clear bilaterally, normal respiratory effort. CV.  Regular rate and rhythm, no JVD, rub or murmur. Abdomen.  Soft, nontender, nondistended, BS positive. CNS.  Alert and oriented .  No focal neurologic deficit. Extremities.  No edema, no cyanosis, pulses intact and symmetrical.   Condition at discharge: stable  The results of significant diagnostics from this hospitalization  (including imaging, microbiology, ancillary and laboratory) are listed below for reference.   Imaging Studies: CT Head Wo Contrast Result Date: 09/13/2023 CLINICAL DATA:  Altered level of consciousness EXAM: CT HEAD WITHOUT CONTRAST TECHNIQUE: Contiguous axial images were obtained from the base of the skull through the vertex without intravenous contrast. RADIATION DOSE REDUCTION: This exam was performed according to the departmental dose-optimization program which includes automated exposure control, adjustment of the mA and/or kV according to patient size and/or use of iterative reconstruction technique. COMPARISON:  08/27/2023 FINDINGS: Brain: No acute infarct or hemorrhage. Lateral ventricles and midline structures are unremarkable. No acute extra-axial fluid collections. No mass effect. Vascular: No hyperdense vessel or unexpected calcification. Skull: Normal. Negative for fracture or focal lesion. Sinuses/Orbits: Mucosal thickening within the ethmoid and maxillary sinuses. Other: None. IMPRESSION: 1. No acute intracranial process. Electronically Signed   By: Ozell Daring M.D.   On: 09/13/2023 19:00   CT ABDOMEN PELVIS W CONTRAST Result Date: 09/13/2023 CLINICAL DATA:  Abdominal pain, acute, nonlocalized. EXAM: CT ABDOMEN AND PELVIS WITH CONTRAST TECHNIQUE: Multidetector CT imaging of the abdomen and pelvis was performed using the standard protocol following bolus administration of intravenous contrast. RADIATION DOSE REDUCTION: This exam was performed according to the departmental dose-optimization program which includes automated exposure control, adjustment of the mA and/or kV according to patient size and/or use of iterative reconstruction technique. CONTRAST:  OMNIPAQUE  IOHEXOL  300 MG/ML  SOLN COMPARISON:  08/01/2023. FINDINGS: Lower chest: The heart is enlarged and there is a trace pericardial effusion. Pacemaker leads are noted in the heart. There is a trace right pleural effusion.  Interstitial prominence and a few airspace opacities are noted at the lung bases, improved from the previous exam. Hepatobiliary: The liver has a nodular contour, compatible with underlying cirrhosis. No focal abnormality is seen in the liver. The gallbladder is surgically absent. No biliary ductal dilatation. A tips shunt is noted. Pancreas: Unremarkable. No pancreatic ductal dilatation or surrounding inflammatory changes. Spleen: The spleen is enlarged.  No focal abnormality is seen. Adrenals/Urinary Tract: The adrenal glands are within normal limits. The kidneys enhance symmetrically. No renal calculus or hydronephrosis bilaterally. The bladder is unremarkable. Stomach/Bowel: The stomach is within normal limits. No bowel obstruction, free air, or pneumatosis is seen. Appendix is not seen. Vascular/Lymphatic: Aortic atherosclerosis. No enlarged abdominal or pelvic lymph nodes. Reproductive: Status post hysterectomy. No adnexal masses. Other: No abdominopelvic ascites. Musculoskeletal: Degenerative changes are present in the thoracolumbar spine. No acute osseous abnormality. IMPRESSION: 1. No acute intra-abdominal process. 2. Morphologic changes of cirrhosis and portal hypertension. TIP shunt in place. 3. Interstitial prominence with a few scattered airspace opacities at the lung bases, improved from the prior exam. 4. Aortic  atherosclerosis. Electronically Signed   By: Leita Birmingham M.D.   On: 09/13/2023 18:57   DG Chest Portable 1 View Result Date: 09/13/2023 EXAM: 1 VIEW XRAY OF THE CHEST 09/13/2023 05:19:53 PM COMPARISON: 08/27/2023 CLINICAL HISTORY: Weakness, AMS. Arrives via EMS from home d/t disorientation; Daughter reports minor confusion noted last night FINDINGS: LUNGS AND PLEURA: The coarse interstitial markings predominantly in the left lower lung are stable. No new infiltrate or overt edema. No pleural effusion. No pneumothorax. HEART AND MEDIASTINUM: No acute abnormality of the cardiac and  mediastinal silhouettes. Atheromatous thoracic aorta. Stable right subclavian transvenous pacemaker. BONES AND SOFT TISSUES: No acute osseous abnormality. IMPRESSION: 1. No acute findings. 2. Stable coarse interstitial markings predominantly in the left lower lung. Electronically signed by: Katheleen Faes MD 09/13/2023 05:25 PM EDT RP Workstation: [id number]   US  Venous Img Lower Unilateral Right Result Date: 08/28/2023 CLINICAL DATA:  83 year old female with shortness of breath. Lower extremity swelling. EXAM: RIGHT LOWER EXTREMITY VENOUS DOPPLER ULTRASOUND TECHNIQUE: Gray-scale sonography with compression, as well as color and duplex ultrasound, were performed to evaluate the deep venous system(s) from the level of the common femoral vein through the popliteal and proximal calf veins. COMPARISON:  None Available. FINDINGS: VENOUS Normal compressibility of the common femoral, superficial femoral, and popliteal veins, as well as the visualized calf veins. Visualized portions of profunda femoral vein and great saphenous vein unremarkable. No filling defects to suggest DVT on grayscale or color Doppler imaging. Doppler waveforms show normal direction of venous flow, normal respiratory plasticity and response to augmentation. Limited views of the contralateral common femoral vein are unremarkable. OTHER None. Limitations: none IMPRESSION: No evidence of right lower extremity deep venous thrombosis. Electronically Signed   By: VEAR Hurst M.D.   On: 08/28/2023 08:56   CT HEAD WO CONTRAST ( ) Result Date: 08/27/2023 CLINICAL DATA:  Headache leg swelling EXAM: CT HEAD WITHOUT CONTRAST TECHNIQUE: Contiguous axial images were obtained from the base of the skull through the vertex without intravenous contrast. RADIATION DOSE REDUCTION: This exam was performed according to the departmental dose-optimization program which includes automated exposure control, adjustment of the mA and/or kV according to patient size and/or  use of iterative reconstruction technique. COMPARISON:  CT brain 06/28/2023 FINDINGS: Brain: No acute territorial infarction, hemorrhage or intracranial mass. Atrophy and mild chronic small vessel ischemic changes of the white matter. Stable ventricle size Vascular: No hyperdense vessels.  Carotid vascular calcification Skull: Normal. Negative for fracture or focal lesion. Sinuses/Orbits: Moderate mucosal thickening in the sinuses. Other: None IMPRESSION: 1. No CT evidence for acute intracranial abnormality. 2. Atrophy and mild chronic small vessel ischemic changes of the white matter. 3. Moderate mucosal thickening in the sinuses. Electronically Signed   By: Luke Bun M.D.   On: 08/27/2023 21:18   DG Chest 2 View Result Date: 08/27/2023 CLINICAL DATA:  Shortness of breath EXAM: CHEST - 2 VIEW COMPARISON:  06/28/2023 FINDINGS: Right-sided pacing device as before. Stable cardiomediastinal silhouette with aortic atherosclerosis. No focal airspace disease, pleural effusion or pneumothorax. Minimal atelectasis or scarring in the left mid to lower lung. IMPRESSION: No active cardiopulmonary disease. Minimal atelectasis or scarring in the left mid to lower lung. Electronically Signed   By: Luke Bun M.D.   On: 08/27/2023 17:46    Microbiology: Results for orders placed or performed during the hospital encounter of 06/28/23  Blood Culture (routine x 2)     Status: None   Collection Time: 06/28/23  9:15 PM   Specimen:  BLOOD  Result Value Ref Range Status   Specimen Description BLOOD RIGHT WRIST  Final   Special Requests   Final    BOTTLES DRAWN AEROBIC AND ANAEROBIC Blood Culture results may not be optimal due to an inadequate volume of blood received in culture bottles   Culture   Final    NO GROWTH 5 DAYS Performed at [facility], [address] [place], [place] [address]    Report Status 07/03/2023 FINAL  Final  Blood Culture (routine x 2)     Status: None   Collection Time:  06/28/23  9:30 PM   Specimen: BLOOD  Result Value Ref Range Status   Specimen Description BLOOD RIGHT FOREARM  Final   Special Requests   Final    BOTTLES DRAWN AEROBIC AND ANAEROBIC Blood Culture results may not be optimal due to an inadequate volume of blood received in culture bottles   Culture   Final    NO GROWTH 5 DAYS Performed at [facility], [address] [place], [place] [address]    Report Status 07/03/2023 FINAL  Final    Labs: CBC: Recent Labs  Lab 09/13/23 1715 09/14/23 0448 09/15/23 0538  WBC 4.8 3.5* 3.6*  NEUTROABS 2.0  --   --   HGB 11.9* 10.8* 11.3*  HCT 33.2* 30.2* 31.9*  MCV 90.0 89.6 89.6  PLT 112* 109* 107*   Basic Metabolic Panel: Recent Labs  Lab 09/13/23 1715 09/14/23 0448 09/15/23 0538 09/16/23 0450 09/16/23 1534  NA 136 138 139 135  --   K 3.0* 2.9* 2.9* 3.2* 3.2*  CL 97* 105 106 99  --   CO2 25 24 24 26   --   GLUCOSE 105* 108* 128* 161*  --   BUN 21 19 17 20   --   CREATININE 0.88 0.80 0.87 0.88  --   CALCIUM 8.9 8.6* 8.5* 9.4  --   MG 2.4 2.4  --   --   --   PHOS  --   --  4.0 4.6  --    Liver Function Tests: Recent Labs  Lab 09/13/23 1715 09/14/23 0448 09/15/23 0538 09/16/23 0450  AST 49* 45*  --   --   ALT 17 17  --   --   ALKPHOS 66 59  --   --   BILITOT 2.6* 2.2*  --   --   PROT 6.9 6.3*  --   --   ALBUMIN  2.8* 2.5* 2.7* 3.2*   CBG: Recent Labs  Lab 09/16/23 2135 09/17/23 0058 09/17/23 0358 09/17/23 0834 09/17/23 1152  GLUCAP 169* 155* 130* 127* 136*    Discharge time spent: greater than 30 minutes.  This record has been created using Conservation officer, historic buildings. Errors have been sought and corrected,but may not always be located. Such creation errors do not reflect on the standard of care.   Signed: Amaryllis Dare, MD [facility] 09/17/2023

## 2023-09-19 ENCOUNTER — Ambulatory Visit: Payer: Medicare PPO

## 2023-09-23 ENCOUNTER — Telehealth: Payer: Self-pay | Admitting: Family

## 2023-09-23 NOTE — Telephone Encounter (Signed)
 Called to confirm/remind patient of their appointment [facility] on 09/24/[facility].   Appointment:   [] Confirmed  [x] Left mess   [] No answer/No voice mail  [] VM Full/unable to leave message  [] Phone not in service  Patient reminded to bring all medications and/or complete list.  Confirmed patient has transportation. Gave directions, instructed to utilize valet parking.

## 2023-09-24 ENCOUNTER — Encounter: Admitting: Family

## 2023-09-24 NOTE — Progress Notes (Deleted)
 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   Admitted 08/01/23 due to intractable nausea vomiting. At discharge, her torsemide  was on hold. She was restarted on torsemide  recently but at a lower dose of 20 mg daily.   Was in the ED 08/27/23 with shortness of breath and pedal edema. Increased confusion since the night prior. BNP slightly elevated stable from prior.  Potassium low at 2.4 CBC shows stable low white count stable low hemoglobin.  Troponin is elevated. Initially IV diuresed. Palliative care consulted and she was discharged the next day.   Admitted 09/13/23 with AMS. Found to have elevated ammonia level at 82 and altered mental status was attributed to acute hepatic encephalopathy. Mental status slowly improved. Palliative care consulted. Found to have stable pancytopenia likely secondary to liver cirrhosis. Hypokalemia corrected. Home with hospice.    She presents today, with her daughter, for a HF follow-up visit with a chief complaint of    ROS: All systems negative except what is listed in HPI, PMH and Problem List   Past Medical History:  Diagnosis Date   Abdominal pain    Allergy    Cancer (HCC) 2017   breast cancer- Left   Cataract    CHF (congestive heart failure) (HCC)    Collagenous colitis    Coronary  artery disease    Diabetes mellitus without complication (HCC)    Diarrhea    Diverticulosis    Dysrhythmia    Fatty liver disease, nonalcoholic    Fibrocystic breast    GERD (gastroesophageal reflux disease)    Heart murmur    Hyperlipidemia    Hypertension    Hypothyroidism    IBS (irritable bowel syndrome)    IDA (iron  deficiency anemia)    Liver cirrhosis (HCC)    Personal history of radiation therapy 2017   LEFT BREAST CA   Presence of permanent cardiac pacemaker    Sleep apnea    C-Pap    Current Outpatient Medications  Medication Sig Dispense Refill   Cholecalciferol  (VITAMIN D ) 50 MCG (2000 UT) CAPS Take 2,000 Units by mouth daily.     clobetasol  ointment (TEMOVATE ) 0.05 % Apply 1 application topically as needed (vaginal irritation).     clotrimazole  (LOTRIMIN ) 1 % cream Apply 1 Application topically 2 (two) times daily as needed.     cyanocobalamin (,VITAMIN B-12,) 1000 MCG/ML injection 1,000 mcg every 30 (thirty) days.     cyclobenzaprine  (FLEXERIL ) 5 MG tablet Take 1 tablet (5 mg total) by mouth 3 (three) times daily as needed for muscle spasms. 30 tablet 0   diazepam  (VALIUM ) 2 MG tablet Take 1 tablet (2 mg total) by mouth every 6 (six) hours as needed for anxiety (Anxiety and muscle spasms that are not corrected with Flexeril ). 30 tablet 0   EPINEPHrine  0.3 mg/0.3 mL IJ SOAJ injection Inject 0.3 mg into the muscle.  esomeprazole (NEXIUM) 40 MG capsule Take 40 mg by mouth 2 (two) times daily before a meal.      fenofibrate  160 MG tablet Take 160 mg by mouth at bedtime.     gabapentin  (NEURONTIN ) 300 MG capsule Take 300 mg by mouth at bedtime.     Insulin  Aspart FlexPen (NOVOLOG ) 100 UNIT/ML as directed. INJECT UP TO 168 UNITS DAILY AS DIRECTED     insulin  degludec (TRESIBA ) 200 UNIT/ML FlexTouch Pen Inject 30 Units into the skin daily.     lactulose , encephalopathy, (CHRONULAC ) 10 GM/15ML SOLN Take 45 mLs by mouth 3 (three) times daily.     levocetirizine (XYZAL) 5  MG tablet Take 5 mg by mouth every evening.     levothyroxine  (SYNTHROID ) 50 MCG tablet Take 50 mcg by mouth. Take 1 tablet (50 mcg total) by mouth once daily     magnesium  oxide (MAG-OX) 400 MG tablet Take 400 mg by mouth 2 (two) times daily.      metolazone  (ZAROXOLYN ) 2.5 MG tablet Take 1 tablet (2.5 mg total) by mouth 2 (two) times a week. On Monday and Friday 24 tablet 3   potassium chloride  (KLOR-CON ) 20 MEQ packet Take 60 mEq by mouth daily. 1 packet with food Orally Once a day; Duration: 30 day(s) 90 packet 3   rifaximin  (XIFAXAN ) 550 MG TABS tablet Take 550 mg by mouth. take 1 tablet (550 mg total) by mouth 2 (two) times daily for 120 days     simvastatin  (ZOCOR ) 20 MG tablet Take 20 mg by mouth at bedtime.     spironolactone  (ALDACTONE ) 50 MG tablet Take 1 tablet (50 mg total) by mouth daily.     torsemide  (DEMADEX ) 20 MG tablet Take 1 tablet (20 mg total) by mouth 3 (three) times daily.     traMADol  (ULTRAM ) 50 MG tablet Take 1 tablet (50 mg total) by mouth every 8 (eight) hours as needed. 60 tablet 0   valACYclovir  (VALTREX ) 500 MG tablet Take 500 mg by mouth 2 (two) times daily as needed.     No current facility-administered medications for this visit.    Allergies  Allergen Reactions   Codeine Itching, Nausea And Vomiting and Other (See Comments)   Demeclocycline Rash   Demerol [Meperidine] Itching and Nausea And Vomiting   Hydrocodone  Itching and Nausea And Vomiting   Morphine  Nausea Only   Other Other (See Comments)   Oxycodone  Itching and Nausea And Vomiting   Pentazocine Itching, Nausea And Vomiting and Other (See Comments)   Tetracyclines & Related Rash   Coal Tar Extract Other (See Comments)   Hydrocodone -Acetaminophen  Other (See Comments)   Salicylic Acid Other (See Comments)   Tetracycline Hcl Other (See Comments)      Social History   Socioeconomic History   Marital status: Married    Spouse name: Alexa   Number of children: Not on file   Years of  education: Not on file   Highest education level: Not on file  Occupational History   Not on file  Tobacco Use   Smoking status: Never   Smokeless tobacco: Never  Vaping Use   Vaping status: Never Used  Substance and Sexual Activity   Alcohol use: Never   Drug use: Never   Sexual activity: Not Currently    Birth control/protection: Surgical  Other Topics Concern   Not on file  Social History Narrative    Husband lives at nursing home , lives with husbands sister   Social Drivers of  Health   Financial Resource Strain: Medium Risk (07/17/2023)   Received from [facility]   Overall Financial Resource Strain (CARDIA)    Difficulty of Paying Living Expenses: Somewhat hard  Food Insecurity: No Food Insecurity (09/14/2023)   Hunger Vital Sign    Worried About Running Out of Food in the Last Year: Never true    Ran Out of Food in the Last Year: Never true  Transportation Needs: No Transportation Needs (09/14/2023)   PRAPARE - Administrator, Civil Service (Medical): No    Lack of Transportation (Non-Medical): No  Physical Activity: Unknown (01/30/2018)   [facility]   Exercise Vital Sign    Days of Exercise per Week: 7 days    Minutes of Exercise per Session: Not on file  Stress: Not on file  Social Connections: Moderately Isolated (09/14/2023)   Social Connection and Isolation Panel    Frequency of Communication with Friends and Family: Once a week    Frequency of Social Gatherings with Friends and Family: Once a week    Attends Religious Services: More than 4 times per year    Active Member of [employer] or Organizations: No    Attends Banker Meetings: Never    Marital Status: Married  Catering manager Violence: Not At Risk (09/14/2023)   Humiliation, Afraid, Rape, and Kick questionnaire    Fear of Current or Ex-Partner: No    Emotionally Abused: No    Physically Abused: No    Sexually Abused: No      Family History   Problem Relation Age of Onset   Breast cancer Paternal Grandmother    Colon cancer Father    Diabetes Sister    Diabetes Brother    Heart disease Brother    Prostate cancer Brother    Colon cancer Maternal Uncle    Prostate cancer Brother    Bladder Cancer Brother    Leukemia Mother        all   Ovarian cancer Neg Hx    Kidney cancer Neg Hx       PHYSICAL EXAM:  General: Well appearing.  Cor: No JVD. Regular rhythm, rate.  Lungs: clear Abdomen: soft, nontender, nondistended. Extremities: 1+ pitting edema right lower leg, trace pitting edema in left lower leg Neuro:. 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  - continue spironolactone  50mg  daily - continue torsemide  60mg  daily/ potassium packet daily - wear compression socks daily - discussed SLGT2 but she's currently having urinary symptoms so will defer today - BNP 08/27/23 was 228.7  2: HTN- - BP  - saw PCP (Tumey) 07/25 - BMET 09/16/23 reviewed: sodium 135, potassium 3.2, creatinine 0.88 & GFR >60  3: PAF- - saw cardiology Dene) 07/25/ returns 10/25 - EKG 09/13/23 was VP  4: Hyperlpidemia- - LDL 09/01/23 was 53 - continue fenofibrate  160mg  daily - continue simvastatin  20mg  daily  5: NASH- - continue lactulose  - continue rifaximin  550mg  daily - continue spironolactone  50mg  daily - ammonia level 09/16/23 was 71  6: T2DM- - continue insulin  - A1c 09/13/23 was 4.3% - saw endocrinology (Solum) 07/25  7: Pancytopenia- - saw oncology Oza) 06/25 - CBC 09/15/23: WBC 3.6, Hg 11.3, PLT 107    Annette Foster, Annette Foster 09/24/23

## 2023-10-13 DEATH — deceased

## 2023-10-14 ENCOUNTER — Other Ambulatory Visit

## 2023-10-14 ENCOUNTER — Ambulatory Visit

## 2023-10-14 ENCOUNTER — Ambulatory Visit: Admitting: Internal Medicine

## 2023-10-15 ENCOUNTER — Inpatient Hospital Stay

## 2023-10-15 ENCOUNTER — Encounter: Payer: Self-pay | Admitting: Internal Medicine

## 2023-10-15 ENCOUNTER — Inpatient Hospital Stay: Admitting: Internal Medicine

## 2023-10-18 ENCOUNTER — Telehealth: Payer: Self-pay | Admitting: Internal Medicine

## 2023-10-18 NOTE — Telephone Encounter (Signed)
 I called patient's family and offered my condolences to patient daughter.  Family very thankful for the call. Appreciate the cancer center staff for the care and support provided to the patient.

## 2023-10-22 ENCOUNTER — Ambulatory Visit: Admitting: Internal Medicine

## 2023-10-27 ENCOUNTER — Ambulatory Visit: Admitting: Podiatry

## 2023-10-30 ENCOUNTER — Ambulatory Visit

## 2023-11-17 ENCOUNTER — Ambulatory Visit: Admitting: Medical

## 2023-11-19 ENCOUNTER — Ambulatory Visit: Admitting: Internal Medicine

## 2023-12-19 ENCOUNTER — Ambulatory Visit: Payer: Medicare PPO

## 2024-03-19 ENCOUNTER — Ambulatory Visit: Payer: Medicare PPO
